# Patient Record
Sex: Female | Born: 1950 | Race: White | Hispanic: No | Smoking: Former smoker
Health system: Southern US, Community
[De-identification: ages and names within clinical notes are randomized; demographics above are authoritative.]

## PROBLEM LIST (undated history)

## (undated) DIAGNOSIS — E039 Hypothyroidism, unspecified: Secondary | ICD-10-CM

## (undated) DIAGNOSIS — G43909 Migraine, unspecified, not intractable, without status migrainosus: Secondary | ICD-10-CM

## (undated) DIAGNOSIS — F1010 Alcohol abuse, uncomplicated: Secondary | ICD-10-CM

## (undated) DIAGNOSIS — G4700 Insomnia, unspecified: Secondary | ICD-10-CM

## (undated) DIAGNOSIS — R05 Cough: Secondary | ICD-10-CM

## (undated) DIAGNOSIS — K56609 Unspecified intestinal obstruction, unspecified as to partial versus complete obstruction: Secondary | ICD-10-CM

## (undated) DIAGNOSIS — I10 Essential (primary) hypertension: Secondary | ICD-10-CM

## (undated) DIAGNOSIS — R52 Pain, unspecified: Secondary | ICD-10-CM

## (undated) DIAGNOSIS — F410 Panic disorder [episodic paroxysmal anxiety] without agoraphobia: Secondary | ICD-10-CM

## (undated) DIAGNOSIS — K219 Gastro-esophageal reflux disease without esophagitis: Secondary | ICD-10-CM

## (undated) DIAGNOSIS — G8929 Other chronic pain: Secondary | ICD-10-CM

## (undated) DIAGNOSIS — R059 Cough, unspecified: Secondary | ICD-10-CM

## (undated) DIAGNOSIS — F32A Depression, unspecified: Secondary | ICD-10-CM

## (undated) DIAGNOSIS — J449 Chronic obstructive pulmonary disease, unspecified: Secondary | ICD-10-CM

## (undated) DIAGNOSIS — F329 Major depressive disorder, single episode, unspecified: Secondary | ICD-10-CM

## (undated) DIAGNOSIS — M8580 Other specified disorders of bone density and structure, unspecified site: Secondary | ICD-10-CM

## (undated) HISTORY — PX: COLON SURGERY: SHX602

## (undated) HISTORY — PX: TUBAL LIGATION: SHX77

## (undated) HISTORY — DX: Insomnia, unspecified: G47.00

## (undated) HISTORY — PX: LAPAROTOMY: SHX154

## (undated) HISTORY — PX: ABDOMINAL HYSTERECTOMY: SHX81

## (undated) HISTORY — DX: Migraine, unspecified, not intractable, without status migrainosus: G43.909

## (undated) HISTORY — DX: Other specified disorders of bone density and structure, unspecified site: M85.80

## (undated) HISTORY — PX: INCONTINENCE SURGERY: SHX676

---

## 2010-09-15 LAB — HM PAP SMEAR

## 2011-10-04 ENCOUNTER — Other Ambulatory Visit: Payer: Self-pay

## 2011-10-04 ENCOUNTER — Inpatient Hospital Stay (HOSPITAL_COMMUNITY)
Admission: EM | Admit: 2011-10-04 | Discharge: 2011-10-24 | DRG: 853 | Disposition: A | Discharge status: Home / Self Care | Attending: Surgery | Admitting: Surgery

## 2011-10-04 ENCOUNTER — Emergency Department (HOSPITAL_COMMUNITY): Admitting: Anesthesiology

## 2011-10-04 ENCOUNTER — Encounter (HOSPITAL_COMMUNITY): Admission: EM | Disposition: A | Discharge status: Home / Self Care | Payer: Self-pay | Source: Home / Self Care

## 2011-10-04 ENCOUNTER — Emergency Department (HOSPITAL_COMMUNITY)

## 2011-10-04 ENCOUNTER — Encounter (HOSPITAL_COMMUNITY): Payer: Self-pay | Admitting: Anesthesiology

## 2011-10-04 ENCOUNTER — Encounter (HOSPITAL_COMMUNITY): Payer: Self-pay | Admitting: *Deleted

## 2011-10-04 DIAGNOSIS — K5900 Constipation, unspecified: Secondary | ICD-10-CM | POA: Diagnosis present

## 2011-10-04 DIAGNOSIS — Z720 Tobacco use: Secondary | ICD-10-CM | POA: Diagnosis present

## 2011-10-04 DIAGNOSIS — K658 Other peritonitis: Secondary | ICD-10-CM | POA: Diagnosis present

## 2011-10-04 DIAGNOSIS — A419 Sepsis, unspecified organism: Principal | ICD-10-CM | POA: Diagnosis present

## 2011-10-04 DIAGNOSIS — J449 Chronic obstructive pulmonary disease, unspecified: Secondary | ICD-10-CM | POA: Diagnosis present

## 2011-10-04 DIAGNOSIS — R188 Other ascites: Secondary | ICD-10-CM | POA: Diagnosis not present

## 2011-10-04 DIAGNOSIS — D649 Anemia, unspecified: Secondary | ICD-10-CM | POA: Diagnosis present

## 2011-10-04 DIAGNOSIS — I10 Essential (primary) hypertension: Secondary | ICD-10-CM | POA: Diagnosis present

## 2011-10-04 DIAGNOSIS — R0902 Hypoxemia: Secondary | ICD-10-CM | POA: Diagnosis present

## 2011-10-04 DIAGNOSIS — E8779 Other fluid overload: Secondary | ICD-10-CM | POA: Diagnosis not present

## 2011-10-04 DIAGNOSIS — J189 Pneumonia, unspecified organism: Secondary | ICD-10-CM | POA: Diagnosis not present

## 2011-10-04 DIAGNOSIS — E861 Hypovolemia: Secondary | ICD-10-CM | POA: Diagnosis not present

## 2011-10-04 DIAGNOSIS — J4489 Other specified chronic obstructive pulmonary disease: Secondary | ICD-10-CM | POA: Diagnosis present

## 2011-10-04 DIAGNOSIS — K67 Disorders of peritoneum in infectious diseases classified elsewhere: Secondary | ICD-10-CM

## 2011-10-04 DIAGNOSIS — Z885 Allergy status to narcotic agent status: Secondary | ICD-10-CM

## 2011-10-04 DIAGNOSIS — K631 Perforation of intestine (nontraumatic): Secondary | ICD-10-CM | POA: Diagnosis present

## 2011-10-04 DIAGNOSIS — R5381 Other malaise: Secondary | ICD-10-CM | POA: Diagnosis not present

## 2011-10-04 DIAGNOSIS — K659 Peritonitis, unspecified: Secondary | ICD-10-CM

## 2011-10-04 DIAGNOSIS — R000 Tachycardia, unspecified: Secondary | ICD-10-CM | POA: Diagnosis not present

## 2011-10-04 DIAGNOSIS — G8929 Other chronic pain: Secondary | ICD-10-CM | POA: Diagnosis present

## 2011-10-04 DIAGNOSIS — Z87891 Personal history of nicotine dependence: Secondary | ICD-10-CM

## 2011-10-04 DIAGNOSIS — G43909 Migraine, unspecified, not intractable, without status migrainosus: Secondary | ICD-10-CM | POA: Diagnosis present

## 2011-10-04 DIAGNOSIS — E876 Hypokalemia: Secondary | ICD-10-CM | POA: Diagnosis present

## 2011-10-04 DIAGNOSIS — R6510 Systemic inflammatory response syndrome (SIRS) of non-infectious origin without acute organ dysfunction: Secondary | ICD-10-CM

## 2011-10-04 DIAGNOSIS — I959 Hypotension, unspecified: Secondary | ICD-10-CM | POA: Diagnosis not present

## 2011-10-04 DIAGNOSIS — R5082 Postprocedural fever: Secondary | ICD-10-CM | POA: Diagnosis not present

## 2011-10-04 DIAGNOSIS — M549 Dorsalgia, unspecified: Secondary | ICD-10-CM | POA: Diagnosis present

## 2011-10-04 DIAGNOSIS — K560 Paralytic ileus: Secondary | ICD-10-CM | POA: Diagnosis not present

## 2011-10-04 DIAGNOSIS — E46 Unspecified protein-calorie malnutrition: Secondary | ICD-10-CM | POA: Diagnosis not present

## 2011-10-04 HISTORY — DX: Essential (primary) hypertension: I10

## 2011-10-04 LAB — DIFFERENTIAL
Basophils Absolute: 0 10*3/uL (ref 0.0–0.1)
Basophils Relative: 0 % (ref 0–1)
Eosinophils Absolute: 0 10*3/uL (ref 0.0–0.7)
Eosinophils Relative: 0 % (ref 0–5)
Lymphocytes Relative: 18 % (ref 12–46)
Lymphs Abs: 0.8 10*3/uL (ref 0.7–4.0)
Monocytes Absolute: 0.2 10*3/uL (ref 0.1–1.0)
Monocytes Relative: 4 % (ref 3–12)
Neutro Abs: 3.6 10*3/uL (ref 1.7–7.7)
Neutrophils Relative %: 78 % — ABNORMAL HIGH (ref 43–77)

## 2011-10-04 LAB — CBC
HCT: 47.8 % — ABNORMAL HIGH (ref 36.0–46.0)
HCT: 44.9 % (ref 36.0–46.0)
HCT: 40.4 % (ref 36.0–46.0)
Hemoglobin: 16 g/dL — ABNORMAL HIGH (ref 12.0–15.0)
Hemoglobin: 14.6 g/dL (ref 12.0–15.0)
Hemoglobin: 13.3 g/dL (ref 12.0–15.0)
MCH: 30.5 pg (ref 26.0–34.0)
MCH: 30.4 pg (ref 26.0–34.0)
MCH: 30.6 pg (ref 26.0–34.0)
MCHC: 33.5 g/dL (ref 30.0–36.0)
MCHC: 32.5 g/dL (ref 30.0–36.0)
MCHC: 32.9 g/dL (ref 30.0–36.0)
MCV: 91 fL (ref 78.0–100.0)
MCV: 93.3 fL (ref 78.0–100.0)
MCV: 92.9 fL (ref 78.0–100.0)
Platelets: 343 10*3/uL (ref 150–400)
Platelets: 296 10*3/uL (ref 150–400)
RBC: 5.25 MIL/uL — ABNORMAL HIGH (ref 3.87–5.11)
RBC: 4.81 MIL/uL (ref 3.87–5.11)
RBC: 4.35 MIL/uL (ref 3.87–5.11)
RDW: 12.3 % (ref 11.5–15.5)
RDW: 12.4 % (ref 11.5–15.5)
RDW: 12.5 % (ref 11.5–15.5)
WBC: 4.6 10*3/uL (ref 4.0–10.5)
WBC: 1.5 10*3/uL — ABNORMAL LOW (ref 4.0–10.5)
WBC: 1.7 10*3/uL — ABNORMAL LOW (ref 4.0–10.5)

## 2011-10-04 LAB — MRSA PCR SCREENING: MRSA by PCR: NEGATIVE

## 2011-10-04 LAB — COMPREHENSIVE METABOLIC PANEL
ALT: 24 U/L (ref 0–35)
AST: 34 U/L (ref 0–37)
Albumin: 4.3 g/dL (ref 3.5–5.2)
Alkaline Phosphatase: 75 U/L (ref 39–117)
BUN: 16 mg/dL (ref 6–23)
CO2: 29 mEq/L (ref 19–32)
Calcium: 9.3 mg/dL (ref 8.4–10.5)
Chloride: 94 mEq/L — ABNORMAL LOW (ref 96–112)
Creatinine, Ser: 0.77 mg/dL (ref 0.50–1.10)
GFR calc Af Amer: >90 mL/min (ref >90)
GFR calc non Af Amer: 88 mL/min — ABNORMAL LOW (ref >90)
Glucose, Bld: 116 mg/dL — ABNORMAL HIGH (ref 70–99)
Potassium: 2.8 mEq/L — ABNORMAL LOW (ref 3.5–5.1)
Sodium: 136 mEq/L (ref 135–145)
Total Bilirubin: 0.4 mg/dL (ref 0.3–1.2)
Total Protein: 7.1 g/dL (ref 6.0–8.3)

## 2011-10-04 LAB — BASIC METABOLIC PANEL
BUN: 11 mg/dL (ref 6–23)
CO2: 22 mEq/L (ref 19–32)
Calcium: 7.6 mg/dL — ABNORMAL LOW (ref 8.4–10.5)
Chloride: 103 mEq/L (ref 96–112)
Creatinine, Ser: 0.65 mg/dL (ref 0.50–1.10)
GFR calc Af Amer: >90 mL/min (ref >90)
GFR calc non Af Amer: >90 mL/min (ref >90)
Glucose, Bld: 122 mg/dL — ABNORMAL HIGH (ref 70–99)
Potassium: 3.5 mEq/L (ref 3.5–5.1)
Sodium: 138 mEq/L (ref 135–145)

## 2011-10-04 LAB — URINALYSIS, ROUTINE W REFLEX MICROSCOPIC
Bilirubin Urine: NEGATIVE
Glucose, UA: NEGATIVE mg/dL
Hgb urine dipstick: NEGATIVE
Ketones, ur: NEGATIVE mg/dL
Leukocytes, UA: NEGATIVE
Nitrite: NEGATIVE
Protein, ur: NEGATIVE mg/dL
Specific Gravity, Urine: 1.019 (ref 1.005–1.030)
Urobilinogen, UA: 0.2 mg/dL (ref 0.0–1.0)
pH: 7.5 (ref 5.0–8.0)

## 2011-10-04 LAB — OCCULT BLOOD, POC DEVICE: Fecal Occult Bld: NEGATIVE

## 2011-10-04 LAB — LIPASE, BLOOD: Lipase: 20 U/L (ref 11–59)

## 2011-10-04 SURGERY — LAPAROTOMY, EXPLORATORY
Anesthesia: General | Site: Abdomen | Wound class: Dirty or Infected

## 2011-10-04 MED ORDER — LACTATED RINGERS IV SOLN
INTRAVENOUS | Status: DC | PRN
  Administered 2011-10-04 (×2): via INTRAVENOUS

## 2011-10-04 MED ORDER — VITAMINS A & D EX OINT
TOPICAL_OINTMENT | CUTANEOUS | Status: AC
  Administered 2011-10-05: 04:00:00
  Filled 2011-10-04: qty 5

## 2011-10-04 MED ORDER — ONDANSETRON HCL 4 MG/2ML IJ SOLN
INTRAMUSCULAR | Status: AC
  Filled 2011-10-04: qty 2

## 2011-10-04 MED ORDER — GLYCOPYRROLATE 0.2 MG/ML IJ SOLN
INTRAMUSCULAR | Status: DC | PRN
  Administered 2011-10-04: 0.6 mg via INTRAVENOUS

## 2011-10-04 MED ORDER — FENTANYL CITRATE 0.05 MG/ML IJ SOLN
50.0000 ug | INTRAMUSCULAR | Status: DC | PRN
  Administered 2011-10-04 – 2011-10-05 (×6): 50 ug via INTRAVENOUS
  Administered 2011-10-05: 100 ug via INTRAVENOUS
  Administered 2011-10-05 (×10): 50 ug via INTRAVENOUS
  Administered 2011-10-06 (×4): 100 ug via INTRAVENOUS
  Filled 2011-10-04 (×16): qty 2

## 2011-10-04 MED ORDER — DEXAMETHASONE SODIUM PHOSPHATE 10 MG/ML IJ SOLN
INTRAMUSCULAR | Status: DC | PRN
  Administered 2011-10-04: 10 mg via INTRAVENOUS

## 2011-10-04 MED ORDER — NEOSTIGMINE METHYLSULFATE 1 MG/ML IJ SOLN
INTRAMUSCULAR | Status: DC | PRN
  Administered 2011-10-04: 4 mg via INTRAVENOUS

## 2011-10-04 MED ORDER — SODIUM CHLORIDE 0.9 % IV BOLUS (SEPSIS)
1000.0000 mL | Freq: Once | INTRAVENOUS | Status: AC
  Administered 2011-10-04: 1000 mL via INTRAVENOUS

## 2011-10-04 MED ORDER — HYDROMORPHONE HCL PF 1 MG/ML IJ SOLN
INTRAMUSCULAR | Status: AC
  Filled 2011-10-04: qty 1

## 2011-10-04 MED ORDER — POVIDONE-IODINE 10 % EX OINT
TOPICAL_OINTMENT | CUTANEOUS | Status: AC
  Filled 2011-10-04: qty 28.35

## 2011-10-04 MED ORDER — SODIUM CHLORIDE 0.9 % IV SOLN
INTRAVENOUS | Status: DC | PRN
  Administered 2011-10-04: 16:00:00 via INTRAVENOUS

## 2011-10-04 MED ORDER — PIPERACILLIN-TAZOBACTAM 3.375 G IVPB
3.3750 g | Freq: Once | INTRAVENOUS | Status: AC
  Administered 2011-10-04: 3.375 g via INTRAVENOUS
  Filled 2011-10-04: qty 50

## 2011-10-04 MED ORDER — MORPHINE SULFATE 4 MG/ML IJ SOLN: 4.0000 mg | INTRAMUSCULAR | Status: DC | PRN

## 2011-10-04 MED ORDER — ONDANSETRON HCL 4 MG/2ML IJ SOLN
4.0000 mg | Freq: Once | INTRAMUSCULAR | Status: AC
  Administered 2011-10-04: 4 mg via INTRAVENOUS
  Filled 2011-10-04: qty 2

## 2011-10-04 MED ORDER — DROPERIDOL 2.5 MG/ML IJ SOLN
INTRAMUSCULAR | Status: DC | PRN
  Administered 2011-10-04: 1.25 mg via INTRAVENOUS

## 2011-10-04 MED ORDER — PANTOPRAZOLE SODIUM 40 MG IV SOLR
40.0000 mg | INTRAVENOUS | Status: DC
  Administered 2011-10-04 – 2011-10-11 (×8): 40 mg via INTRAVENOUS
  Filled 2011-10-04 (×10): qty 40

## 2011-10-04 MED ORDER — HYDROMORPHONE HCL PF 1 MG/ML IJ SOLN
0.2500 mg | INTRAMUSCULAR | Status: DC | PRN
  Administered 2011-10-04 (×4): 0.5 mg via INTRAVENOUS
  Filled 2011-10-04: qty 1

## 2011-10-04 MED ORDER — SODIUM CHLORIDE 0.9 % IV BOLUS (SEPSIS)
500.0000 mL | Freq: Once | INTRAVENOUS | Status: AC
  Administered 2011-10-04: 500 mL via INTRAVENOUS

## 2011-10-04 MED ORDER — CISATRACURIUM BESYLATE 2 MG/ML IV SOLN
INTRAVENOUS | Status: DC | PRN
  Administered 2011-10-04: 5 mg via INTRAVENOUS
  Administered 2011-10-04 (×2): 1 mg via INTRAVENOUS

## 2011-10-04 MED ORDER — HYDROMORPHONE HCL PF 1 MG/ML IJ SOLN
1.0000 mg | Freq: Once | INTRAMUSCULAR | Status: AC
  Administered 2011-10-04: 1 mg via INTRAVENOUS
  Filled 2011-10-04: qty 1

## 2011-10-04 MED ORDER — LACTATED RINGERS IV SOLN: INTRAVENOUS | Status: DC

## 2011-10-04 MED ORDER — SUCCINYLCHOLINE CHLORIDE 20 MG/ML IJ SOLN
INTRAMUSCULAR | Status: DC | PRN
  Administered 2011-10-04: 100 mg via INTRAVENOUS

## 2011-10-04 MED ORDER — SODIUM CHLORIDE 0.9 % IV SOLN
1.0000 g | INTRAVENOUS | Status: DC
  Administered 2011-10-04 – 2011-10-14 (×11): 1 g via INTRAVENOUS
  Filled 2011-10-04 (×12): qty 1

## 2011-10-04 MED ORDER — FIBRIN SEALANT COMPONENT 2 ML EX KIT
PACK | CUTANEOUS | Status: AC
  Filled 2011-10-04: qty 2

## 2011-10-04 MED ORDER — HYDROMORPHONE HCL PF 1 MG/ML IJ SOLN
1.0000 mg | Freq: Once | INTRAMUSCULAR | Status: AC
  Administered 2011-10-04: 1 mg via INTRAVENOUS

## 2011-10-04 MED ORDER — FENTANYL CITRATE 0.05 MG/ML IJ SOLN
INTRAMUSCULAR | Status: DC | PRN
  Administered 2011-10-04: 25 ug via INTRAVENOUS
  Administered 2011-10-04: 100 ug via INTRAVENOUS

## 2011-10-04 MED ORDER — MORPHINE SULFATE 4 MG/ML IJ SOLN
4.0000 mg | Freq: Once | INTRAMUSCULAR | Status: AC
  Administered 2011-10-04: 4 mg via INTRAVENOUS
  Filled 2011-10-04: qty 1

## 2011-10-04 MED ORDER — ONDANSETRON HCL 4 MG PO TABS: 4.0000 mg | ORAL_TABLET | Freq: Four times a day (QID) | ORAL | Status: DC | PRN

## 2011-10-04 MED ORDER — SODIUM CHLORIDE 0.9 % IR SOLN
Status: DC | PRN
  Administered 2011-10-04: 10000 mL

## 2011-10-04 MED ORDER — PIPERACILLIN-TAZOBACTAM 3.375 G IVPB
3.3750 g | Freq: Three times a day (TID) | INTRAVENOUS | Status: DC
  Filled 2011-10-04 (×2): qty 50

## 2011-10-04 MED ORDER — POTASSIUM CHLORIDE IN NACL 40-0.9 MEQ/L-% IV SOLN
INTRAVENOUS | Status: DC
  Filled 2011-10-04 (×4): qty 1000

## 2011-10-04 MED ORDER — ACETAMINOPHEN 650 MG RE SUPP
650.0000 mg | Freq: Once | RECTAL | Status: AC
  Administered 2011-10-04: 650 mg via RECTAL

## 2011-10-04 MED ORDER — ACETAMINOPHEN 650 MG RE SUPP
RECTAL | Status: AC
  Filled 2011-10-04: qty 1

## 2011-10-04 MED ORDER — PROMETHAZINE HCL 25 MG/ML IJ SOLN
25.0000 mg | Freq: Once | INTRAMUSCULAR | Status: AC
  Administered 2011-10-04: 25 mg via INTRAVENOUS
  Filled 2011-10-04: qty 1

## 2011-10-04 MED ORDER — LACTATED RINGERS IV SOLN
INTRAVENOUS | Status: DC | PRN
  Administered 2011-10-04: 17:00:00 via INTRAVENOUS

## 2011-10-04 MED ORDER — ONDANSETRON HCL 4 MG/2ML IJ SOLN
4.0000 mg | Freq: Four times a day (QID) | INTRAMUSCULAR | Status: DC | PRN
  Filled 2011-10-04: qty 2

## 2011-10-04 MED ORDER — ONDANSETRON HCL 4 MG/2ML IJ SOLN
INTRAMUSCULAR | Status: DC | PRN
  Administered 2011-10-04: 4 mg via INTRAVENOUS

## 2011-10-04 MED ORDER — HEPARIN SODIUM (PORCINE) 5000 UNIT/ML IJ SOLN
5000.0000 [IU] | Freq: Three times a day (TID) | INTRAMUSCULAR | Status: DC
  Administered 2011-10-04 – 2011-10-24 (×60): 5000 [IU] via SUBCUTANEOUS
  Filled 2011-10-04 (×63): qty 1

## 2011-10-04 MED ORDER — POTASSIUM CHLORIDE IN NACL 40-0.9 MEQ/L-% IV SOLN
INTRAVENOUS | Status: DC
  Administered 2011-10-04 – 2011-10-06 (×4): via INTRAVENOUS
  Administered 2011-10-06 (×2): 15 mL/h via INTRAVENOUS
  Filled 2011-10-04 (×12): qty 1000

## 2011-10-04 MED ORDER — VANCOMYCIN HCL 1000 MG IV SOLR
750.0000 mg | Freq: Three times a day (TID) | INTRAVENOUS | Status: DC
  Administered 2011-10-04 – 2011-10-05 (×3): 750 mg via INTRAVENOUS
  Filled 2011-10-04 (×5): qty 750

## 2011-10-04 MED ORDER — POTASSIUM CHLORIDE 10 MEQ/100ML IV SOLN
10.0000 meq | Freq: Once | INTRAVENOUS | Status: AC
  Administered 2011-10-04: 10 meq via INTRAVENOUS
  Filled 2011-10-04: qty 100

## 2011-10-04 MED ORDER — PROPOFOL 10 MG/ML IV EMUL
INTRAVENOUS | Status: DC | PRN
  Administered 2011-10-04: 150 mg via INTRAVENOUS

## 2011-10-04 MED ORDER — HYDROMORPHONE HCL PF 1 MG/ML IJ SOLN: 0.5000 mg | INTRAMUSCULAR | Status: DC | PRN

## 2011-10-04 SURGICAL SUPPLY — 42 items
APPLICATOR COTTON TIP 6IN STRL (MISCELLANEOUS) ×2 IMPLANT
BLADE EXTENDED COATED 6.5IN (ELECTRODE) IMPLANT
BLADE HEX COATED 2.75 (ELECTRODE) ×2 IMPLANT
CANISTER SUCTION 2500CC (MISCELLANEOUS) ×2 IMPLANT
CLOTH BEACON ORANGE TIMEOUT ST (SAFETY) ×2 IMPLANT
COVER MAYO STAND STRL (DRAPES) IMPLANT
DRAPE LAPAROSCOPIC ABDOMINAL (DRAPES) ×2 IMPLANT
DRAPE WARM FLUID 44X44 (DRAPE) IMPLANT
DRSG PAD ABDOMINAL 8X10 ST (GAUZE/BANDAGES/DRESSINGS) ×1 IMPLANT
ELECT REM PT RETURN 9FT ADLT (ELECTROSURGICAL) ×2
ELECTRODE REM PT RTRN 9FT ADLT (ELECTROSURGICAL) ×1 IMPLANT
GLOVE BIOGEL PI IND STRL 7.0 (GLOVE) ×1 IMPLANT
GLOVE BIOGEL PI INDICATOR 7.0 (GLOVE) ×1
GLOVE EUDERMIC 7 POWDERFREE (GLOVE) ×2 IMPLANT
GOWN STRL NON-REIN LRG LVL3 (GOWN DISPOSABLE) ×2 IMPLANT
GOWN STRL REIN XL XLG (GOWN DISPOSABLE) ×4 IMPLANT
KIT BASIN OR (CUSTOM PROCEDURE TRAY) ×2 IMPLANT
LIGASURE IMPACT 36 18CM CVD LR (INSTRUMENTS) ×1 IMPLANT
NS IRRIG 1000ML POUR BTL (IV SOLUTION) ×2 IMPLANT
PACK GENERAL/GYN (CUSTOM PROCEDURE TRAY) ×2 IMPLANT
RELOAD PROXIMATE 75MM BLUE (ENDOMECHANICALS) ×2 IMPLANT
RELOAD STAPLE 75 3.8 BLU REG (ENDOMECHANICALS) IMPLANT
SPONGE GAUZE 4X4 12PLY (GAUZE/BANDAGES/DRESSINGS) ×2 IMPLANT
SPONGE LAP 18X18 X RAY DECT (DISPOSABLE) IMPLANT
STAPLER PROXIMATE 75MM BLUE (STAPLE) ×1 IMPLANT
STAPLER VISISTAT 35W (STAPLE) ×2 IMPLANT
SUCTION POOLE TIP (SUCTIONS) IMPLANT
SUT PDS AB 1 CTX 36 (SUTURE) IMPLANT
SUT PDS AB 1 TP1 96 (SUTURE) ×4 IMPLANT
SUT SILK 2 0 (SUTURE)
SUT SILK 2 0 SH CR/8 (SUTURE) IMPLANT
SUT SILK 2-0 18XBRD TIE 12 (SUTURE) IMPLANT
SUT SILK 3 0 (SUTURE)
SUT SILK 3 0 SH CR/8 (SUTURE) IMPLANT
SUT SILK 3-0 18XBRD TIE 12 (SUTURE) IMPLANT
SUT VIC AB 3-0 SH 18 (SUTURE) ×1 IMPLANT
SUT VICRYL 2 0 18  UND BR (SUTURE)
SUT VICRYL 2 0 18 UND BR (SUTURE) IMPLANT
TAPE CLOTH SURG 4X10 WHT LF (GAUZE/BANDAGES/DRESSINGS) ×1 IMPLANT
TOWEL OR 17X26 10 PK STRL BLUE (TOWEL DISPOSABLE) ×4 IMPLANT
TRAY FOLEY CATH 14FRSI W/METER (CATHETERS) IMPLANT
YANKAUER SUCT BULB TIP NO VENT (SUCTIONS) IMPLANT

## 2011-10-04 NOTE — Anesthesia Preprocedure Evaluation (Addendum)
Anesthesia Evaluation  Patient identified by MRN, date of birth, ID band Patient awake    Reviewed: Allergy & Precautions, H&P , NPO status , Patient's Chart, lab work & pertinent test results  Airway Mallampati: II TM Distance: >3 FB Neck ROM: full    Dental  (+) Missing and Dental Advisory Given,    Pulmonary neg pulmonary ROS,  breath sounds clear to auscultation  Pulmonary exam normal       Cardiovascular Exercise Tolerance: Good hypertension, negative cardio ROS  Rhythm:regular Rate:Normal  Early sepsis   Neuro/Psych negative neurological ROS  negative psych ROS   GI/Hepatic negative GI ROS, Neg liver ROS, Perforated bowel with free air   Endo/Other  negative endocrine ROS  Renal/GU negative Renal ROS  negative genitourinary   Musculoskeletal   Abdominal   Peds  Hematology negative hematology ROS (+)   Anesthesia Other Findings   Reproductive/Obstetrics negative OB ROS                          Anesthesia Physical Anesthesia Plan  ASA: III and Emergent  Anesthesia Plan: General   Post-op Pain Management:    Induction: Intravenous, Rapid sequence and Cricoid pressure planned  Airway Management Planned: Oral ETT  Additional Equipment:   Intra-op Plan:   Post-operative Plan: Extubation in OR  Informed Consent: I have reviewed the patients History and Physical, chart, labs and discussed the procedure including the risks, benefits and alternatives for the proposed anesthesia with the patient or authorized representative who has indicated his/her understanding and acceptance.   Dental Advisory Given  Plan Discussed with: CRNA and Surgeon  Anesthesia Plan Comments:        Anesthesia Quick Evaluation

## 2011-10-04 NOTE — H&P (Signed)
Stacy Dalton is an 61 y.o. female.   Chief Complaint: Acute onset of abdominal pain HPI: This is a 61 year old female who was in her normal state of health until this point around 11 AM. She or her husband both report acute onset of abdominal pain. He was extremely severe she was brought to the emergency room where initial evaluation there was concern her respiratory status because of her hypoxia. Her complaint was abdominal pain. Chest x-ray on admission showed of large amount of free air in the abdomen. CT of the abdomen shows a large amount of free intraperitoneal air consistent with perforated bowel. It was done without contrast there was no abnormality the stomach is some fecal material which may not be in the colon. We were called to the ER and saw the patient emergently. On arrival bladder temperature was 103.3. She was extremely tachycardic heart rates in the 110-120 range. O2 saturations were dropping down into the 80s on a nasal cannula she was placed on a facemask. She's been treated with analgesics and Phenergan for pain and nausea. Zosyn was been started by the emergency room physician. She was hypokalemic potassium was started in the ER. She was evaluated by Dr. Claud Kelp and it was his opinion she needed to go to the operating room emergently for exploratory laparotomy and repair of perforated viscus. The risk and benefits were discussed with the patient and her husband and brother.  Past Medical History  Diagnosis Date  . Migraine   . Hypertension CHRONIC BACK PAIN she uses hydrocodone almost daily HISTORY OF TOBACCO USE/NONE FOR 13 YEARS     Past Surgical History  Procedure Date  . Tubal ligation   . Bladder tac     History reviewed. No pertinent family history. Social History:  reports that she quit smoking about 14 years ago. She has never used smokeless tobacco. She reports that she does not drink alcohol or use illicit drugs.tobacco:  UP TO 2ppd 35 PLUS YEAR  ETOH:  none  Drugs: none  Allergies:  Allergies  Allergen Reactions  . Demerol   . Esgic (Butalbital-Apap-Caffeine)   . Iodine Other (See Comments)    bp bottomed out per pt several hours later; unsure if pre medicated in past with cm; done in [place]    Medications Prior to Admission  Medication Dose Route Frequency Provider Last Rate Last Dose  . 0.9 % NaCl with KCl 40 mEq / L  infusion   Intravenous Continuous Sherrie George, PA      . acetaminophen (TYLENOL) suppository 650 mg  650 mg Rectal Once Ernestene Mention, MD   650 mg at 10/04/11 1531  . HYDROmorphone (DILAUDID) injection 0.5-1.5 mg  0.5-1.5 mg Intravenous Q1H PRN Sherrie George, PA      . HYDROmorphone (DILAUDID) injection 1 mg  1 mg Intravenous Once Hilario Quarry, MD   1 mg at 10/04/11 1442  . HYDROmorphone (DILAUDID) injection 1 mg  1 mg Intravenous Once Ernestene Mention, MD   1 mg at 10/04/11 1529  . morphine 4 MG/ML injection 4 mg  4 mg Intravenous Once Fayrene Helper, PA-C   4 mg at 10/04/11 1346  . ondansetron (ZOFRAN) injection 4 mg  4 mg Intravenous Once Fayrene Helper, PA-C   4 mg at 10/04/11 1346  . ondansetron (ZOFRAN) injection 4 mg  4 mg Intravenous Once Hilario Quarry, MD   4 mg at 10/04/11 1443  . piperacillin-tazobactam (ZOSYN) IVPB 3.375 g  3.375  g Intravenous Once Hilario Quarry, MD   3.375 g at 10/04/11 1558  . piperacillin-tazobactam (ZOSYN) IVPB 3.375 g  3.375 g Intravenous Q8H Sherrie George, [place]      . potassium chloride 10 mEq in 100 mL IVPB  10 mEq Intravenous Once Hilario Quarry, MD      . promethazine (PHENERGAN) injection 25 mg  25 mg Intravenous Once Ernestene Mention, MD   25 mg at 10/04/11 1528  . sodium chloride 0.9 % bolus 1,000 mL  1,000 mL Intravenous Once Fayrene Helper, PA-C   1,000 mL at 10/04/11 1347  . sodium chloride 0.9 % bolus 1,000 mL  1,000 mL Intravenous Once Hilario Quarry, MD   1,000 mL at 10/04/11 1443  . sodium chloride 0.9 % bolus 1,000 mL  1,000 mL Intravenous Once Hilario Quarry, MD   1,000  mL at 10/04/11 1558   No current outpatient prescriptions on file as of 10/04/2011.    Results for orders placed during the hospital encounter of 10/04/11 (from the past 48 hour(s))  CBC     Status: Abnormal   Collection Time   10/04/11  1:45 PM      Component Value Range Comment   WBC 4.6  4.0 - 10.5 (K/uL)    RBC 5.25 (*) 3.87 - 5.11 (MIL/uL)    Hemoglobin 16.0 (*) 12.0 - 15.0 (g/dL)    HCT 16.1 (*) 09.6 - 46.0 (%)    MCV 91.0  78.0 - 100.0 (fL)    MCH 30.5  26.0 - 34.0 (pg)    MCHC 33.5  30.0 - 36.0 (g/dL)    RDW 04.5  40.9 - 81.1 (%)    Platelets 343  150 - 400 (K/uL)   DIFFERENTIAL     Status: Abnormal   Collection Time   10/04/11  1:45 PM      Component Value Range Comment   Neutrophils Relative 78 (*) 43 - 77 (%)    Neutro Abs 3.6  1.7 - 7.7 (K/uL)    Lymphocytes Relative 18  12 - 46 (%)    Lymphs Abs 0.8  0.7 - 4.0 (K/uL)    Monocytes Relative 4  3 - 12 (%)    Monocytes Absolute 0.2  0.1 - 1.0 (K/uL)    Eosinophils Relative 0  0 - 5 (%)    Eosinophils Absolute 0.0  0.0 - 0.7 (K/uL)    Basophils Relative 0  0 - 1 (%)    Basophils Absolute 0.0  0.0 - 0.1 (K/uL)   COMPREHENSIVE METABOLIC PANEL     Status: Abnormal   Collection Time   10/04/11  1:45 PM      Component Value Range Comment   Sodium 136  135 - 145 (mEq/L)    Potassium 2.8 (*) 3.5 - 5.1 (mEq/L)    Chloride 94 (*) 96 - 112 (mEq/L)    CO2 29  19 - 32 (mEq/L)    Glucose, Bld 116 (*) 70 - 99 (mg/dL)    BUN 16  6 - 23 (mg/dL)    Creatinine, Ser 9.14  0.50 - 1.10 (mg/dL)    Calcium 9.3  8.4 - 10.5 (mg/dL)    Total Protein 7.1  6.0 - 8.3 (g/dL)    Albumin 4.3  3.5 - 5.2 (g/dL)    AST 34  0 - 37 (U/L)    ALT 24  0 - 35 (U/L)    Alkaline Phosphatase 75  39 - 117 (U/L)  Total Bilirubin 0.4  0.3 - 1.2 (mg/dL)    GFR calc non Af Amer 88 (*) >90 (mL/min)    GFR calc Af Amer >90  >90 (mL/min)   LIPASE, BLOOD     Status: Normal   Collection Time   10/04/11  1:45 PM      Component Value Range Comment   Lipase 20  11 -  59 (U/L)   OCCULT BLOOD, POC DEVICE     Status: Normal   Collection Time   10/04/11  2:34 PM      Component Value Range Comment   Fecal Occult Bld NEGATIVE     URINALYSIS, ROUTINE W REFLEX MICROSCOPIC     Status: Normal   Collection Time   10/04/11  2:48 PM      Component Value Range Comment   Color, Urine YELLOW  YELLOW     APPearance CLEAR  CLEAR     Specific Gravity, Urine 1.019  1.005 - 1.030     pH 7.5  5.0 - 8.0     Glucose, UA NEGATIVE  NEGATIVE (mg/dL)    Hgb urine dipstick NEGATIVE  NEGATIVE     Bilirubin Urine NEGATIVE  NEGATIVE     Ketones, ur NEGATIVE  NEGATIVE (mg/dL)    Protein, ur NEGATIVE  NEGATIVE (mg/dL)    Urobilinogen, UA 0.2  0.0 - 1.0 (mg/dL)    Nitrite NEGATIVE  NEGATIVE     Leukocytes, UA NEGATIVE  NEGATIVE  MICROSCOPIC NOT DONE ON URINES WITH NEGATIVE PROTEIN, BLOOD, LEUKOCYTES, NITRITE, OR GLUCOSE <1000 mg/dL.   Ct Abdomen Pelvis Wo Contrast  10/04/2011  *RADIOLOGY REPORT*  Clinical Data: Abdominal pain with nausea and vomiting for 2 days, some abdominal distention  CT ABDOMEN AND PELVIS WITHOUT CONTRAST  Technique:  Multidetector CT imaging of the abdomen and pelvis was performed following the standard protocol without intravenous contrast.  Comparison: None.  Findings: Mild basilar linear atelectasis is present.  The liver is unremarkable in the unenhanced state.  There is some perihepatic ascites present.  The gallbladder is somewhat distended but no calcified gallstones are seen and the gallbladder wall does not appear to be thickened. The common bile duct is slightly prominent measuring 8 mm in diameter.  Correlation with liver function tests is recommended.  The pancreas is unremarkable and the pancreatic duct does not appear to be dilated on this unenhanced study.  The adrenal glands and spleen are unremarkable.  The stomach is largely decompressed and cannot be evaluated.  However, there is free intraperitoneal air present consistent with perforated viscus.  No  renal calculi are seen and there is no evidence of hydronephrosis. The aorta is normal in caliber with atheromatous change present.  The urinary bladder is decompressed with Foley catheter.  The uterus appears to have been resected.  There is a large amount of free air anteriorly within the pelvis.  Although there is no evidence of diverticulitis a colon perforation would be expected in view of the large amount of air present.  There is a moderate amount of feces throughout the colon.  Some feces in the anterior lower pelvis cannot definitely be confirmed to be within the lumen of the colon.  Some ascites layers within the pelvis.  An ischemic process cannot be excluded on this exam since no IV contrast media could be administered. The appendix is unremarkable.  No bony abnormality is seen.  IMPRESSION:  1.  Large amount of free intraperitoneal air consistent with perforated bowel.  No  abnormality of the stomach is seen and the perforation may involve the colon.  Some fecal material may not be within the lumen of the colon in the region of the mid anterior pelvis. 2.  Some free fluid is noted throughout the peritoneal cavity. 3.  Slightly prominent common bile duct.  Correlate with liver function tests.  Critical Value/emergent results were called by telephone at the time of interpretation on 10/04/2011  at 3:30 p.m.  to  Dr. Margarita Grizzle, who verbally acknowledged these results.  Original Report Authenticated By: Juline Patch, M.D.   Dg Chest Port 1 View  10/04/2011  *RADIOLOGY REPORT*  Clinical Data: 61 year old female with abdominal and lower chest pain.  Fever.  PORTABLE CHEST - 1 VIEW  Comparison: None.  Findings: Portable semi upright AP view the chest 1457 hours. Positive for pneumoperitoneum. Elevation of the diaphragm. Crowding of lung markings at both bases slightly worse on the left. Cardiac size and mediastinal contours are within normal limits. Visualized tracheal air column is within normal limits.   No pneumothorax or definite effusion.  Indistinct bowel gas in the visualized upper abdomen.  IMPRESSION: 1.  Positive for free air in the abdomen. Critical Value/emergent results were called by telephone at the time of interpretation on 10/04/2011  at 1508 hours  to  Dr. Margarita Grizzle, who verbally acknowledged these results. CT abdomen and pelvis is pending at the time of this dictation. 2.  Low lung volumes.  Patchy bibasilar opacity.  Original Report Authenticated By: Harley Hallmark, M.D.    Review of Systems  Constitutional: Negative.   HENT: Negative for neck pain.   Eyes: Negative.   Respiratory: Positive for shortness of breath. Negative for cough, hemoptysis, sputum production and wheezing.        DOE  Cardiovascular: Positive for chest pain (occasional, fairly vagure) and palpitations (occasional). Negative for orthopnea, claudication, leg swelling and PND.  Gastrointestinal: Positive for heartburn, nausea, abdominal pain and constipation (OCCASIONAL). Negative for vomiting, diarrhea, blood in stool and melena.  Genitourinary: Negative.   Musculoskeletal: Positive for back pain (CHRONIC BACK PAIN WITH HYDROCODONE AT HOME). Negative for myalgias, joint pain and falls.  Skin: Negative.   Neurological: Positive for headaches (hx migraines).  Endo/Heme/Allergies: Negative.   Psychiatric/Behavioral: Negative.     Blood pressure 139/71, pulse 119, temperature 103.3 F (39.6 C), temperature source Core (Comment), resp. rate 91, SpO2 91.00%. Physical Exam  Constitutional: She appears well-developed and well-nourished. She appears distressed.       ACUTELY ILL WF, DESATING ON NASAL CANNULA AT TIME OF INTERVIEW.  tEMP 103.3  HR 118 139/76  HENT:  Head: Normocephalic and atraumatic.  Eyes: Conjunctivae and EOM are normal. Pupils are equal, round, and reactive to light. Right eye exhibits no discharge. Left eye exhibits no discharge. No scleral icterus.  Neck: Normal range of motion. Neck  supple. No JVD present. No tracheal deviation present. No thyromegaly present.  Cardiovascular: Regular rhythm, normal heart sounds and intact distal pulses.  Exam reveals no gallop and no friction rub.   No murmur heard.      TACHYCARDIC HR 110'S   Respiratory: She is in respiratory distress (BREATHING HARD AND SAT'S DROPPING). She has no wheezes. She has no rales. She exhibits no tenderness.  GI: She exhibits distension. There is tenderness. There is rebound and guarding.       diffusely TENDER, TO ANY PALPATION, DISTENDED NO BOWEL SOUNDS.  Musculoskeletal: She exhibits edema (TRACE LE EDEMA).  Lymphadenopathy:  She has no cervical adenopathy.  Neurological: She is alert. No cranial nerve deficit.       SLIGHTLY CONFUSED NOW.  Skin: Skin is warm. No rash noted. She is diaphoretic. No erythema. There is pallor.  Psychiatric:       IN EXTREME DISCOMFORT AND DISTRESS     Assessment/Plan 1. Perforated viscus of uncertain etiology. 2.  Sepsis 3. COPD, history dyspnea on exertion, history of heavy tobacco use in the past. 4. Hypertension 5. History of migraines. 6. History of chronic back pain 7. Hypokalemia  Plan: Patient was seen and evaluated by Dr. Claud Kelp plan take patient to the operating room emergently. Risk and benefits were discussed with the patient and her family in detail. She's been started on antibiotics, volume replacement, and potassium replacement.  [facility] physician assistant for Dr. Claud Kelp         Nevena Rozenberg 10/04/2011, 4:08 PM

## 2011-10-04 NOTE — Consult Note (Signed)
Name: Stacy Dalton MRN: [id number] DOB: Jun 04, 1951    LOS: 0  PCCM CONSULTATION NOTE  History of Present Illness: 61 yo brought to [facility] ED on 4/3 with acute onset abdominal pain.  She was febrile and tachycardic. Imaging demonstrated large amount of intraperitoneal air.  Antibiotics were given.  She was taken to OR for emergency exploratory laparotomy which demonstrated perforated colon and diffuse peritonitis.  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  She was extubated in OR and brought to ICU for further management.  In ICU she was transiently hypotensive after being medicated with Dialudid but responded to fluids.  At this time she reports moderate incisional pain aggravated by breathing and mild nausea.   Lines / Drains: 4/3  Foley 4/3  NGT  Cultures: None  Antibiotics: 4/3  Zosyn x 1 4/3  Vancomycin 4/3  Ertapenem  Tests / Events: 4/3  Abdomen CT >>>  Free intraperitoneal air, fluid in peritoneal cavity, prominent bile duct 4/4  Elploratory laparotomy, colectomy, colostomy  Past Medical History  Diagnosis Date  . Migraine   . Hypertension    Past Surgical History  Procedure Date  . Tubal ligation   . Bladder tac    Prior to Admission medications   Medication Sig Start Date End Date Taking? Authorizing Provider  HYDROcodone-acetaminophen (NORCO) 5-325 MG per tablet Take 1 tablet by mouth every 6 (six) hours as needed. For pain relief   Yes Historical Provider, MD  Multiple Vitamin (MULITIVITAMIN WITH MINERALS) TABS Take 1 tablet by mouth daily.   Yes Historical Provider, MD   Allergies Allergies  Allergen Reactions  . Demerol   . Esgic (Butalbital-Apap-Caffeine)   . Iodine Other (See Comments)    bp bottomed out per pt several hours later; unsure if pre medicated in past with cm; done in [place]   Family History History reviewed. No pertinent family history.  Social History  reports that she quit smoking about 14 years ago. She has never used smokeless  tobacco. She reports that she does not drink alcohol or use illicit drugs.  Review Of Systems  Per HPI.  Vital Signs: Temp:  [98 F (36.7 C)-103.3 F (39.6 C)] 99.7 F (37.6 C) (04/03 2000) Pulse Rate:  [89-119] 105  (04/03 2025) Resp:  [17-91] 20  (04/03 2025) BP: (97-170)/(48-93) 112/53 mmHg (04/03 2025) SpO2:  [83 %-98 %] 92 % (04/03 2025) FiO2 (%):  [100 %] 100 % (04/03 1553) I/O last 3 completed shifts: In: 3180 [I.V.:3150; NG/GT:30] Out: 1100 [Urine:950; Blood:150]  Physical Examination: General:  Appears ill, pale, no acute distress Neuro:  Awake, alert, cooperative with exam HEENT:  NGT, dry membranes Neck:  Supple, no JVD   Cardiovascular:  RRR, no M/R/G Lungs:  Bilateral diminished air entry, no W/R/R Abdomen:  Clean surgical dressing, viable colostomy, tenderness to palpation, absent bowel sounds Musculoskeletal:  Moves all extremities, no pedal edema Skin:  No rash  Ventilator settings: Vent Mode:  [-]  FiO2 (%):  [100 %] 100 %  Labs and Imaging:  Reviewed.  Please refer to the Assessment and Plan section for relevant results.  ASSESSMENT AND PLAN  NEUROLOGIC A:  No issues.  Fair pain control but hypotensive with Dilaudid. P: -->  D/c Dilaudid / Morphine -->  Fentanyl PRN  PULMONARY No results found for this basename: PHART:5,PCO2:5,PCO2ART:5,PO2ART:5,HCO3:5,O2SAT:5 in the last 168 hours A:  Transient hypoxia, likely postop atelectasis. P: -->  Supplemental oxygen -->  Incentive spirometry -->  Pain control as above  CARDIOVASCULAR No results found for this basename: TROPONINI:5,LATICACIDVEN:5, O2SATVEN:5,PROBNP:5 in the last 168 hours A:  SIRS secondary to peritonitis.  Hypovolemia. P: -->  NS boluses -->  Goal MAP 60-65 -->  No indications for CVL / Vasopressors at this time  RENAL  Lab 10/04/11 1855 10/04/11 1345  NA 138 136  K 3.5 2.8*  CL 103 94*  CO2 22 29  BUN 11 16  CREATININE 0.65 0.77  CALCIUM 7.6* 9.3  MG -- --  PHOS -- --     A:  Normal renal function.  Hypokalemia - resolved. P: -->  BMP, Mg, Phos in AM -->  IVF  GASTROINTESTINAL  Lab 10/04/11 1345  AST 34  ALT 24  ALKPHOS 75  BILITOT 0.4  PROT 7.1  ALBUMIN 4.3   A:  Perforated viscus s/p colectomy / colostomy. P: -->  Per CCS  HEMATOLOGIC  Lab 10/04/11 1952 10/04/11 1855 10/04/11 1345  HGB 13.3 14.6 16.0*  HCT 40.4 44.9 47.8*  PLT 296 SPECIMEN CLOTTED 343  INR -- -- --  APTT -- -- --   A:  No active issues. P: -->  CBC in AM  INFECTIOUS  Lab 10/04/11 1952 10/04/11 1855 10/04/11 1345  WBC 1.7* 1.5* 4.6  PROCALCITON -- -- --   A:  Peritonitis.  Reactive leukopenia. P: -->  Check PCT -->  Continue Ertapenem -->  Consider d/c Vancomycin -->  If signs of sepsis would add Micafungin  ENDOCRINE No results found for this basename: GLUCAP:5 in the last 168 hours A:  No active issues. P: -->  No interventions required  BEST PRACTICE / DISPOSITION -->  ICU status under CCS -->  PCCM consulting -->  Full code -->  NPO -->  Heparin Grampian for DVT Px -->  Protonix IV for GI Px -->  Family updated at bedside  Orlean Bradford, M.D., F.C.C.P. Pulmonary and Critical Care Medicine [facility] Cell: [phone] Pager: [phone]  10/04/2011, 9:36 PM

## 2011-10-04 NOTE — H&P (Signed)
Patient interviewed and examined. X-ray studies reviewed. I agree with the evaluation and treatment plan as outlined by Zola Button, PA  See my dictated preoperative note.Angelia Mould. Derrell Lolling, M.D., [facility] General and Minimally invasive Surgery Breast and Colorectal Surgery Office:   [phone] Pager:   [phone]

## 2011-10-04 NOTE — Anesthesia Postprocedure Evaluation (Signed)
  Anesthesia Post-op Note  Patient: Stacy Dalton  Procedure(s) Performed: Procedure(s) (LRB): EXPLORATORY LAPAROTOMY (N/A)  Patient Location: PACU  Anesthesia Type: General  Level of Consciousness: awake and alert   Airway and Oxygen Therapy: Patient Spontanous Breathing  Post-op Pain: mild  Post-op Assessment: Post-op Vital signs reviewed, Patient's Cardiovascular Status Stable, Respiratory Function Stable, Patent Airway and No signs of Nausea or vomiting  Post-op Vital Signs: stable  Complications: No apparent anesthesia complications

## 2011-10-04 NOTE — Op Note (Signed)
Patient Name:           Stacy Dalton   Date of Surgery:        10/04/2011  Pre op Diagnosis:      Perforated viscus  Post op Diagnosis:    Stercoral perforation left colon with diffuse peritonitis  Procedure:                 Exploratory laparotomy, left colectomy, closure distal segment, and descending colostomy  Surgeon:                     Angelia Mould. Derrell Lolling, M.D., FACS  Assistant:                      Zola Button, [place]  Operative Indications:   This is a 61 year old Caucasian female with hypertension, former smoker, intermittent constipation. At 11 AM this morning she had the sudden onset of abdominal pain which was severe and unrelenting. She came to the emergency room where she was evaluated by the emergency department physician. Chest x-ray showed large pneumoperitoneum. CT scan showed large pneumoperitoneum, but no focal inflammatory process. On review of the CT [place] questioning whether there might be some stool balls outside the lumen of the colon. Examination revealed febrile, obtundent patient with obvious diffuse peritonitis. She was resuscitated and brought to the operating room emergently  Operative Findings:     There was a stercoral perforation of the distal descending colon. There was a large hole in the antimesenteric wall of the distal descending colon but there was no acute inflammation or diverticulitis. There was a lot of liquid stool and numerous hard stool balls throughout the mid and lower abdomen. The proximal colon was a little bit dilated. The cecum and right colon showed no evidence of ischemia or perforation. There was a lot of pasty stool around the splenic flexure and the proximal descending colon. We milked some of this out but most of it had to be left behind. After resecting the disease segment we found that the distal segment, which was more Prolene sutures, was very long composed most of the sigmoid colon. The small bowel and appendix looked normal. The liver  gallbladder and stomach looked normal. The spleen felt normal in size.    Procedure in Detail:          Following the induction of general endotracheal anesthesia a nasogastric tube was placed. She already had a Foley catheter. Surgical time out was performed. The abdomen was prepped and draped in a sterile fashion. A midline laparotomy incision was made around the umbilicus. The fascia was incised in the midline. Once we did determine that it was stool contamination we extended the incision inferiorly. The abdomen was explored with findings as described above. We thoroughly explored the abdomen removing numerous stool balls. We identified the source of perforation in the distal descending colon. We mobilized the descending colon thoroughly by dividing its lateral peritoneal attachments. We resected the perforated segment by using a GIA stapler proximally and distally and the LigaSure device on the mesentery. We took the mesentery down a little bit so that the colostomy would come out the abdominal wall. We removed the specimen.  We spent a  long time irrigating the subphrenic spaces, abdominal spaces and pelvic spaces. I probably irrigated 10 or 12 L of saline. At the completion of irrigation the fluid looked pretty clear and we could not find any other stool balls anywhere. I ran  the small bowel and it looked fine. We looked at the colon but right side transverse and descending and rectum and there was no other evidence of any perforation. There was some stool proximally and distally consistent with chronic constipation. After satisfying ourselves that we had removed the fecal contamination we brought out a colostomy in the left abdominal wall and a suitable place through the rectus sheath. I cut a button of skin, debrided the fat, incised the anterior rectus sheath in a cruciate fashion, and incised the posterior rectus sheath and dilated the tract so  it would admit 2 fingers. We very carefully brought the  colostomy out making sure that we didn't twist it or turn in any way. We checked carefully for laparotomy sponges several times and did preliminary and final counts which were correct.The colon was pink and viable. We returned the transverse colon and omentum to their anatomic positions. The midline fascia was closed with a running suture of #1 double-stranded PDS and also used several interrupted sutures of #1 Novafil. The skin was left open and packed with saline gauze. I amputated the staple line on the colostomy and matured the colostomy with numerous interrupted sutures of 3-0 Vicryl. Colostomy bled well and was pink and healthy. I could pass my finger down into the colostomy and past the fascia and so there were did not appear to be any obstruction or stricture. Colostomy bag was placed. Dry bandages were placed. The patient tolerated the procedure was taken recovery room in reasonably hemodynamic and stable condition. EBL 150 cc. Counts correct. No complications.     Angelia Mould. Derrell Lolling, M.D., FACS General and Minimally Invasive Surgery Breast and Colorectal Surgery  10/04/2011 6:14 PM

## 2011-10-04 NOTE — ED Notes (Signed)
Per ems: pt is c/o abdominal pain started about a hour ago. Pt has an iv lac. Pt had several emesis one on transport

## 2011-10-04 NOTE — Progress Notes (Signed)
ANTIBIOTIC CONSULT NOTE - INITIAL  Pharmacy Consult for Vancomycin Indication: Perforated bowel s/p ex lap  Allergies  Allergen Reactions  . Demerol   . Esgic (Butalbital-Apap-Caffeine)   . Iodine Other (See Comments)    bp bottomed out per pt several hours later; unsure if pre medicated in past with cm; done in [place]    Patient Measurements:   Weight (10/04/11) on ICU bed: 69 kg Height (estimated): 67"  Vital Signs: Temp: 98 F (36.7 C) (04/03 1821) Temp src: Core (Comment) (04/03 1453) BP: 103/57 mmHg (04/03 1900) Pulse Rate: 93  (04/03 1900)  Labs:  Basename 10/04/11 1345  WBC 4.6  HGB 16.0*  PLT 343  LABCREA --  CREATININE 0.77   CrCl is unknown because there is no height on file for the current visit. No results found for this basename: VANCOTROUGH:2,VANCOPEAK:2,VANCORANDOM:2,GENTTROUGH:2,GENTPEAK:2,GENTRANDOM:2,TOBRATROUGH:2,TOBRAPEAK:2,TOBRARND:2,AMIKACINPEAK:2,AMIKACINTROU:2,AMIKACIN:2, in the last 72 hours   Microbiology: No results found for this or any previous visit (from the past 720 hour(s)).  Medical History: Past Medical History  Diagnosis Date  . Migraine   . Hypertension    Assessment: 61yof with stercoral perforation left colon with diffuse peritonitis s/p exploratory laparotomy.  Patient received Zosyn pre-op and will be continuing zosyn with vancomycin post-op.  CrCl~81 ml/min, CrCl(N)~85 ml/min.  Goal of Therapy:  Vancomycin trough level 15-20 mcg/ml  Plan:  Vancomycin 750g IV q8h. Obtain trough at steady state.  Clance Boll 10/04/2011,7:14 PM

## 2011-10-04 NOTE — Progress Notes (Signed)
General surgery attending note:  This patient has been interviewed and examined in the emergency department.  Sudden onset non-localizing abdominal pain at 11 AM today. No past history of ulcer or stomach disease. No past history of colonic disease. Never had a colonoscopy. Alert on arrival in the ED.  Exam:  diffuse peritonitis on exam, non-localizing and mental obtundation which may be a narcotic and may be impending sepsis.  Chest x-ray shows massive pneumoperitoneum under both diaphragms. CT scan without contrast  confirms pneumoperitoneum but does not demonstrate a focal inflammatory process.  Assessment: Perforated viscus with impending sepsis. This may be foregut or hindgut in etiology.                         HTN                         Former smoker  Plan: The patient will be taken emergently to the operating room for exploratory laparotomy and repair of perforated viscus.  I've discussed the indications and details of the surgery with the patient, who has limited understanding, the husband and the brother. Differential diagnosis was discussed including perforated ulcer, perforated small bowel, perforated colon. Possibilities of temporary or permanent colostomy, blood loss, postop ventilatory dependence were discussed. They are aware that this is a life-threatening event and that there is no definitive alternatives to surgical intervention. Their questions were answered. They understand these issues. They agree with this plan.   Angelia Mould. Derrell Lolling, M.D., [facility] General and Minimally invasive Surgery Breast and Colorectal Surgery Office:   [phone] Pager:   [phone]

## 2011-10-04 NOTE — ED Provider Notes (Signed)
History     CSN: [id number]  Arrival date & time 10/04/11  1229   First MD Initiated Contact with Patient 10/04/11 1419      Chief Complaint  Patient presents with  . Abdominal Pain    (Consider location/radiation/quality/duration/timing/severity/associated sxs/prior treatment) HPI  Patient with acute onset of diffuse abdominal pain at 1100 a.m. Patient with nausea and vomiting.  Patient states food in emesis, no blood.  Patient with constipation recently.  No fever.  Patient with chills here.  Pain is severe and like big cramp and feel sob.    History reviewed. No pertinent past medical history.  History reviewed. No pertinent past surgical history.  No family history on file.  History  Substance Use Topics  . Smoking status: Never Smoker   . Smokeless tobacco: Never Used  . Alcohol Use: No    OB History    Grav Para Term Preterm Abortions TAB SAB Ect Mult Living                  Review of Systems  All other systems reviewed and are negative.    Allergies  Review of patient's allergies indicates no known allergies.  Home Medications   Current Outpatient Rx  Name Route Sig Dispense Refill  . HYDROCODONE-ACETAMINOPHEN 5-325 MG PO TABS Oral Take 1 tablet by mouth every 6 (six) hours as needed. For pain relief    . ADULT MULTIVITAMIN W/MINERALS CH Oral Take 1 tablet by mouth daily.      BP 145/70  Pulse 90  Temp(Src) 98.7 F (37.1 C) (Oral)  Resp 24  SpO2 96%  Physical Exam  Nursing note and vitals reviewed. Constitutional: She is oriented to person, place, and time. She appears well-developed and well-nourished.  HENT:  Head: Normocephalic and atraumatic.  Mouth/Throat: Oropharyngeal exudate: pale conjunctiva.  Eyes: Pupils are equal, round, and reactive to light.  Neck: Normal range of motion. Neck supple.  Cardiovascular: Tachycardia present.   Pulmonary/Chest:       No bowel sounds, abdomen distended and diffusely tender  Musculoskeletal: Normal  range of motion.  Neurological: She is alert and oriented to person, place, and time.  Skin: Skin is warm and dry.  Psychiatric: She has a normal mood and affect.    ED Course  Procedures (including critical care time)   Labs Reviewed  CBC  DIFFERENTIAL  COMPREHENSIVE METABOLIC PANEL  LIPASE, BLOOD  URINALYSIS, ROUTINE W REFLEX MICROSCOPIC   No results found.   No diagnosis found.    MDM  Patient's plain film results and CT of the abdomen discussed with radiologist. I have spoken with general surgery with Will PA. He and Dr. Derrell Lolling will be down to assess the patient. She has remained n.p.o. She is given Zosyn 3.75 mg IV. She has had 2 L of normal saline and blood pressure remains in mid 100s. She is being given a third liter of normal saline. Patient began.  having chills and spiked fever to 103 and became tachycardiac.  Dr. Derrell Lolling at bedside.   Patient with free air in abdomen c.w. Perforated large bowel on ct.  Patient had ct done at same time as plain film as pain seen at triage with Mr. Laveda Norman and initial orders placed from triage.  CRITICAL CARE Performed by: Hilario Quarry   Total critical care time: 45  Critical care time was exclusive of separately billable procedures and treating other patients.  Critical care was necessary to treat or prevent imminent or life-threatening  deterioration.  Critical care was time spent personally by me on the following activities: development of treatment plan with patient and/or surrogate as well as nursing, discussions with consultants, evaluation of patient's response to treatment, examination of patient, obtaining history from patient or surrogate, ordering and performing treatments and interventions, ordering and review of laboratory studies, ordering and review of radiographic studies, pulse oximetry and re-evaluation of patient's condition.       Hilario Quarry, MD 10/09/11 1343

## 2011-10-04 NOTE — ED Provider Notes (Signed)
Medical screening examination/treatment/procedure(s) were performed by non-physician practitioner and as supervising physician I was immediately available for consultation/collaboration.  Doug Sou, MD 10/04/11 1424

## 2011-10-04 NOTE — ED Provider Notes (Signed)
Pt originally screened by me.  Pt w/ hx of HTN presents with acute onset of diffused abd pain, with associated N/V.  Last BM 2 days ago.  Also c/o SOB.  Is a former smoker.  On exam, pt appears in mild res distress.  Speaking 5-10 words sentences, accessory muscle use.  Satting @ 92-94% on 2L.  Decreased breath sound throughout, Abd distended on tender on exam, with peritoneal sign.  CBC, CMP, lipase order.  Upright abd xray ordered.  Pain medication and antiemetic ordered.  O2 mask initiated.    Fayrene Helper, PA-C 10/04/11 1334

## 2011-10-04 NOTE — ED Notes (Signed)
Pt state she started to have abdominal pain this morning. Pt states she has had several emesis. Pt state she has been constipated for 2 days. Pt abdomen noted to be distended and tender to touch

## 2011-10-04 NOTE — Transfer of Care (Signed)
Immediate Anesthesia Transfer of Care Note  Patient: Stacy Dalton  Procedure(s) Performed: Procedure(s) (LRB): EXPLORATORY LAPAROTOMY (N/A)  Patient Location: PACU  Anesthesia Type: General  Level of Consciousness: awake, sedated and patient cooperative  Airway & Oxygen Therapy: Patient Spontanous Breathing and Patient connected to face mask oxygen  Post-op Assessment: Report given to PACU RN and Post -op Vital signs reviewed and stable  Post vital signs: Reviewed and stable  Complications: No apparent anesthesia complications

## 2011-10-04 NOTE — ED Notes (Signed)
PA Bowie asked for patient to be placed on mask instead of nasal cannula. Task completed.

## 2011-10-05 DIAGNOSIS — E876 Hypokalemia: Secondary | ICD-10-CM

## 2011-10-05 DIAGNOSIS — J9819 Other pulmonary collapse: Secondary | ICD-10-CM

## 2011-10-05 DIAGNOSIS — K631 Perforation of intestine (nontraumatic): Secondary | ICD-10-CM | POA: Diagnosis present

## 2011-10-05 DIAGNOSIS — R6510 Systemic inflammatory response syndrome (SIRS) of non-infectious origin without acute organ dysfunction: Secondary | ICD-10-CM | POA: Diagnosis present

## 2011-10-05 DIAGNOSIS — K659 Peritonitis, unspecified: Secondary | ICD-10-CM | POA: Diagnosis present

## 2011-10-05 DIAGNOSIS — J9600 Acute respiratory failure, unspecified whether with hypoxia or hypercapnia: Secondary | ICD-10-CM

## 2011-10-05 LAB — CBC
HCT: 39.8 % (ref 36.0–46.0)
Hemoglobin: 13.1 g/dL (ref 12.0–15.0)
MCH: 29.8 pg (ref 26.0–34.0)
MCHC: 32.9 g/dL (ref 30.0–36.0)
MCV: 90.5 fL (ref 78.0–100.0)
Platelets: 242 10*3/uL (ref 150–400)
RBC: 4.4 MIL/uL (ref 3.87–5.11)
RDW: 12.5 % (ref 11.5–15.5)
WBC: 3.7 10*3/uL — ABNORMAL LOW (ref 4.0–10.5)

## 2011-10-05 LAB — BASIC METABOLIC PANEL
BUN: 9 mg/dL (ref 6–23)
BUN: 9 mg/dL (ref 6–23)
CO2: 27 mEq/L (ref 19–32)
CO2: 25 mEq/L (ref 19–32)
Calcium: 7.4 mg/dL — ABNORMAL LOW (ref 8.4–10.5)
Calcium: 7.7 mg/dL — ABNORMAL LOW (ref 8.4–10.5)
Chloride: 104 mEq/L (ref 96–112)
Chloride: 105 mEq/L (ref 96–112)
Creatinine, Ser: 0.68 mg/dL (ref 0.50–1.10)
Creatinine, Ser: 0.58 mg/dL (ref 0.50–1.10)
GFR calc Af Amer: >90 mL/min (ref >90)
GFR calc Af Amer: >90 mL/min (ref >90)
GFR calc non Af Amer: >90 mL/min (ref >90)
GFR calc non Af Amer: >90 mL/min (ref >90)
Glucose, Bld: 125 mg/dL — ABNORMAL HIGH (ref 70–99)
Glucose, Bld: 94 mg/dL (ref 70–99)
Potassium: 2.8 mEq/L — ABNORMAL LOW (ref 3.5–5.1)
Potassium: 4.5 mEq/L (ref 3.5–5.1)
Sodium: 138 mEq/L (ref 135–145)
Sodium: 135 mEq/L (ref 135–145)

## 2011-10-05 LAB — PROTIME-INR
INR: 1.22 (ref 0.00–1.49)
Prothrombin Time: 15.7 seconds — ABNORMAL HIGH (ref 11.6–15.2)

## 2011-10-05 LAB — MAGNESIUM: Magnesium: 2 mg/dL (ref 1.5–2.5)

## 2011-10-05 LAB — PHOSPHORUS: Phosphorus: 2.3 mg/dL (ref 2.3–4.6)

## 2011-10-05 LAB — APTT: aPTT: 35 seconds (ref 24–37)

## 2011-10-05 LAB — PROCALCITONIN: Procalcitonin: 17.61 ng/mL

## 2011-10-05 MED ORDER — MENTHOL 3 MG MT LOZG
1.0000 | LOZENGE | OROMUCOSAL | Status: DC | PRN
  Administered 2011-10-05: 3 mg via ORAL
  Filled 2011-10-05: qty 9

## 2011-10-05 MED ORDER — POTASSIUM CHLORIDE 10 MEQ/100ML IV SOLN
10.0000 meq | INTRAVENOUS | Status: AC
  Administered 2011-10-05 (×6): 10 meq via INTRAVENOUS
  Filled 2011-10-05: qty 100
  Filled 2011-10-05: qty 400
  Filled 2011-10-05: qty 100

## 2011-10-05 MED ORDER — SODIUM CHLORIDE 0.9 % IV BOLUS (SEPSIS)
500.0000 mL | Freq: Once | INTRAVENOUS | Status: AC
  Administered 2011-10-05: 500 mL via INTRAVENOUS

## 2011-10-05 MED ORDER — POTASSIUM CHLORIDE 10 MEQ/100ML IV SOLN: 10.0000 meq | INTRAVENOUS | Status: DC

## 2011-10-05 NOTE — Consult Note (Deleted)
Name: Stacy Dalton MRN: [id number] DOB: 10/14/1950    LOS: 1  PCCM CONSULTATION NOTE  History of Present Illness: 61 y/o F brought to [facility] ED on 4/3 with acute onset abdominal pain.  She was febrile and tachycardic. Imaging demonstrated large amount of intraperitoneal air.  Antibiotics were given.  She was taken to OR for emergency exploratory laparotomy which demonstrated perforated colon and diffuse peritonitis.  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  She was extubated in OR and brought to ICU for further management.  In ICU she was transiently hypotensive after being medicated with Dialudid but responded to fluids.     Lines / Drains: 4/3  Foley 4/3  NGT  Cultures: None  Antibiotics: 4/3  Zosyn x 1 4/3  Vancomycin 4/3  Ertapenem  Tests / Events: 4/3  Abdomen CT >>>  Free intraperitoneal air, fluid in peritoneal cavity, prominent bile duct 4/4  Elploratory laparotomy, colectomy, colostomy    Subjective:   Patient c/o's abd pain, denies SOB, chest pain, n/v.    Vital Signs: Filed Vitals:   10/05/11 0400 10/05/11 0500 10/05/11 0700 10/05/11 0800  BP: 104/58 114/59 106/58 109/61  Pulse: 106 105 108 110  Temp: 100.8 F (38.2 C) 100.8 F (38.2 C) 100.8 F (38.2 C) 100.8 F (38.2 C)  TempSrc:      Resp: 21 29 27 27   Height:      Weight:      SpO2: 96% 97% 97% 97%    I/O last 3 completed shifts: In: 5030 [I.V.:4650; NG/GT:30; IV Piggyback:350] Out: 2061 [Urine:1900; Emesis/NG output:5; Stool:6; Blood:150]  Physical Examination: General:  Appears ill, pale, no acute distress Neuro:  Awake, alert, cooperative with exam HEENT:  NGT, dry membranes Neck:  Supple, no JVD   Cardiovascular:  RRR, no M/R/G Lungs:  Bilateral diminished air entry, resp's even/non-labored Abdomen:  Clean surgical dressing, viable colostomy, tenderness to palpation, absent bowel sounds Musculoskeletal:  Moves all extremities, no pedal edema Skin:  No rash   Labs and Imaging:   Reviewed.  Please refer to the Assessment and Plan section for relevant results.      ASSESSMENT AND PLAN  NEUROLOGIC A:  No issues.  Fair pain control but hypotensive with Dilaudid. P: -->  D/c Dilaudid / Morphine -->  Fentanyl PRN for pain  PULMONARY A:  Transient hypoxia, likely postop atelectasis. P: -->  Supplemental oxygen -->  Incentive spirometry -->  Pain control as above -->  Follow up cxr in am   CARDIOVASCULAR A:  SIRS secondary to peritonitis.  Hypovolemia. P: -->  NS boluses -->  Goal MAP 60-65 -->  No indications for CVL / Vasopressors at this time  RENAL  Lab 10/05/11 0347 10/04/11 1855 10/04/11 1345  NA 138 138 136  K 2.8* 3.5 --  CL 104 103 94*  CO2 27 22 29   BUN 9 11 16   CREATININE 0.68 0.65 0.77  CALCIUM 7.4* 7.6* 9.3  MG 2.0 -- --  PHOS 2.3 -- --   A:  Normal renal function.  Hypokalemia - resolved. P: -->  BMP, Mg, Phos in AM -->  IVF -->  Replete K+ am 4/4, f/u bmp evening 4/4  GASTROINTESTINAL  Lab 10/04/11 1345  AST 34  ALT 24  ALKPHOS 75  BILITOT 0.4  PROT 7.1  ALBUMIN 4.3   A:  Perforated viscus s/p colectomy / colostomy. P: -->  Per CCS  HEMATOLOGIC  Lab 10/05/11 0347 10/04/11 1952 10/04/11 1855 10/04/11 1345  HGB  13.1 13.3 14.6 16.0*  HCT 39.8 40.4 44.9 47.8*  PLT 242 296 SPECIMEN CLOTTED 343  INR 1.22 -- -- --  APTT 35 -- -- --   A:  No active issues. P: -->  CBC in AM  INFECTIOUS  Lab 10/05/11 0347 10/05/11 0001 10/04/11 1952 10/04/11 1855 10/04/11 1345  WBC 3.7* -- 1.7* 1.5* 4.6  PROCALCITON -- 17.61 -- -- --   A:  Peritonitis.  Reactive leukopenia. P: -->  Check PCT -->  Continue Ertapenem -->  Consider d/c Vancomycin -->  If signs of sepsis would add Micafungin  ENDOCRINE A:  No active issues. P: -->  No interventions required  BEST PRACTICE / DISPOSITION -->  ICU status under CCS -->  PCCM consulting -->  Full code -->  NPO -->  Heparin [place] for DVT Px -->  Protonix IV for GI Px -->   Family updated at bedside    Canary Brim, NP-C [facility] Pgr: [phone]

## 2011-10-05 NOTE — Progress Notes (Signed)
K+ this am 2.8, supplemented per IV. 1600 K+ 4.5. Assisted up to chair, sat up >5 hours. Received fentanyl 50 mcg IV throughout the shift for pain. Bolus NS given for bp in 90s.  On n/c O2, instructed to TCDB, 1750 per incentive spirometer.

## 2011-10-05 NOTE — Progress Notes (Signed)
1 Day Post-Op  Subjective: More alertnd oriented. Still has significant abdominal pain. Shallow breathing. Good urine output. Otherwise stable and does not appear to be in any acute respiratory distress.  Potassium 2.8. Creatinine 0.68. BUN 9. WBC up to 3.7. Hemoglobin 13.1.  Objective: Vital signs in last 24 hours: Temp:  [98 F (36.7 C)-103.3 F (39.6 C)] 100.8 F (38.2 C) (04/04 0400) Pulse Rate:  [89-119] 106  (04/04 0400) Resp:  [15-91] 21  (04/04 0400) BP: (87-170)/(46-93) 104/58 mmHg (04/04 0400) SpO2:  [83 %-98 %] 96 % (04/04 0400) FiO2 (%):  [100 %] 100 % (04/03 1553) Weight:  [149 lb 0.5 oz (67.6 kg)] 149 lb 0.5 oz (67.6 kg) (04/03 2000)    Intake/Output from previous day: 04/03 0701 - 04/04 0700 In: 5030 [I.V.:4650; NG/GT:30; IV Piggyback:350] Out: 2061 [Urine:1900; Emesis/NG output:5; Stool:6; Blood:150] Intake/Output this shift: Total I/O In: 1850 [I.V.:1500; IV Piggyback:350] Out: 961 [Urine:950; Emesis/NG output:5; Stool:6]  General appearance: alert. Mild to moderate distress from abdominal pain. Husband at bedside. Resp: lungs basically clear to auscultation anteriorly. Decreased breath sounds bases. Diminished  respiratory excursion. No wheeze. GI: incision clean and dry. Colostomy pink and viable. Moderate distention. Appropriate diffuse tenderness. No bowel sounds.  Lab Results:  Results for orders placed during the hospital encounter of 10/04/11 (from the past 24 hour(s))  CBC     Status: Abnormal   Collection Time   10/04/11  1:45 PM      Component Value Range   WBC 4.6  4.0 - 10.5 (K/uL)   RBC 5.25 (*) 3.87 - 5.11 (MIL/uL)   Hemoglobin 16.0 (*) 12.0 - 15.0 (g/dL)   HCT 16.1 (*) 09.6 - 46.0 (%)   MCV 91.0  78.0 - 100.0 (fL)   MCH 30.5  26.0 - 34.0 (pg)   MCHC 33.5  30.0 - 36.0 (g/dL)   RDW 04.5  40.9 - 81.1 (%)   Platelets 343  150 - 400 (K/uL)  DIFFERENTIAL     Status: Abnormal   Collection Time   10/04/11  1:45 PM      Component Value Range   Neutrophils Relative 78 (*) 43 - 77 (%)   Neutro Abs 3.6  1.7 - 7.7 (K/uL)   Lymphocytes Relative 18  12 - 46 (%)   Lymphs Abs 0.8  0.7 - 4.0 (K/uL)   Monocytes Relative 4  3 - 12 (%)   Monocytes Absolute 0.2  0.1 - 1.0 (K/uL)   Eosinophils Relative 0  0 - 5 (%)   Eosinophils Absolute 0.0  0.0 - 0.7 (K/uL)   Basophils Relative 0  0 - 1 (%)   Basophils Absolute 0.0  0.0 - 0.1 (K/uL)  COMPREHENSIVE METABOLIC PANEL     Status: Abnormal   Collection Time   10/04/11  1:45 PM      Component Value Range   Sodium 136  135 - 145 (mEq/L)   Potassium 2.8 (*) 3.5 - 5.1 (mEq/L)   Chloride 94 (*) 96 - 112 (mEq/L)   CO2 29  19 - 32 (mEq/L)   Glucose, Bld 116 (*) 70 - 99 (mg/dL)   BUN 16  6 - 23 (mg/dL)   Creatinine, Ser 9.14  0.50 - 1.10 (mg/dL)   Calcium 9.3  8.4 - 78.2 (mg/dL)   Total Protein 7.1  6.0 - 8.3 (g/dL)   Albumin 4.3  3.5 - 5.2 (g/dL)   AST 34  0 - 37 (U/L)   ALT 24  0 -  35 (U/L)   Alkaline Phosphatase 75  39 - 117 (U/L)   Total Bilirubin 0.4  0.3 - 1.2 (mg/dL)   GFR calc non Af Amer 88 (*) >90 (mL/min)   GFR calc Af Amer >90  >90 (mL/min)  LIPASE, BLOOD     Status: Normal   Collection Time   10/04/11  1:45 PM      Component Value Range   Lipase 20  11 - 59 (U/L)  OCCULT BLOOD, POC DEVICE     Status: Normal   Collection Time   10/04/11  2:34 PM      Component Value Range   Fecal Occult Bld NEGATIVE    URINALYSIS, ROUTINE W REFLEX MICROSCOPIC     Status: Normal   Collection Time   10/04/11  2:48 PM      Component Value Range   Color, Urine YELLOW  YELLOW    APPearance CLEAR  CLEAR    Specific Gravity, Urine 1.019  1.005 - 1.030    pH 7.5  5.0 - 8.0    Glucose, UA NEGATIVE  NEGATIVE (mg/dL)   Hgb urine dipstick NEGATIVE  NEGATIVE    Bilirubin Urine NEGATIVE  NEGATIVE    Ketones, ur NEGATIVE  NEGATIVE (mg/dL)   Protein, ur NEGATIVE  NEGATIVE (mg/dL)   Urobilinogen, UA 0.2  0.0 - 1.0 (mg/dL)   Nitrite NEGATIVE  NEGATIVE    Leukocytes, UA NEGATIVE  NEGATIVE   CBC     Status:  Abnormal   Collection Time   10/04/11  6:55 PM      Component Value Range   WBC 1.5 (*) 4.0 - 10.5 (K/uL)   RBC 4.81  3.87 - 5.11 (MIL/uL)   Hemoglobin 14.6  12.0 - 15.0 (g/dL)   HCT 16.1  09.6 - 04.5 (%)   MCV 93.3  78.0 - 100.0 (fL)   MCH 30.4  26.0 - 34.0 (pg)   MCHC 32.5  30.0 - 36.0 (g/dL)   RDW 40.9  81.1 - 91.4 (%)   Platelets SPECIMEN CLOTTED  150 - 400 (K/uL)  BASIC METABOLIC PANEL     Status: Abnormal   Collection Time   10/04/11  6:55 PM      Component Value Range   Sodium 138  135 - 145 (mEq/L)   Potassium 3.5  3.5 - 5.1 (mEq/L)   Chloride 103  96 - 112 (mEq/L)   CO2 22  19 - 32 (mEq/L)   Glucose, Bld 122 (*) 70 - 99 (mg/dL)   BUN 11  6 - 23 (mg/dL)   Creatinine, Ser 7.82  0.50 - 1.10 (mg/dL)   Calcium 7.6 (*) 8.4 - 10.5 (mg/dL)   GFR calc non Af Amer >90  >90 (mL/min)   GFR calc Af Amer >90  >90 (mL/min)  CBC     Status: Abnormal   Collection Time   10/04/11  7:52 PM      Component Value Range   WBC 1.7 (*) 4.0 - 10.5 (K/uL)   RBC 4.35  3.87 - 5.11 (MIL/uL)   Hemoglobin 13.3  12.0 - 15.0 (g/dL)   HCT 95.6  21.3 - 08.6 (%)   MCV 92.9  78.0 - 100.0 (fL)   MCH 30.6  26.0 - 34.0 (pg)   MCHC 32.9  30.0 - 36.0 (g/dL)   RDW 57.8  46.9 - 62.9 (%)   Platelets 296  150 - 400 (K/uL)  MRSA PCR SCREENING     Status: Normal   Collection Time  10/04/11  8:48 PM      Component Value Range   MRSA by PCR NEGATIVE  NEGATIVE   PROCALCITONIN     Status: Normal   Collection Time   10/05/11 12:01 AM      Component Value Range   Procalcitonin 17.61    BASIC METABOLIC PANEL     Status: Abnormal   Collection Time   10/05/11  3:47 AM      Component Value Range   Sodium 138  135 - 145 (mEq/L)   Potassium 2.8 (*) 3.5 - 5.1 (mEq/L)   Chloride 104  96 - 112 (mEq/L)   CO2 27  19 - 32 (mEq/L)   Glucose, Bld 125 (*) 70 - 99 (mg/dL)   BUN 9  6 - 23 (mg/dL)   Creatinine, Ser 4.54  0.50 - 1.10 (mg/dL)   Calcium 7.4 (*) 8.4 - 10.5 (mg/dL)   GFR calc non Af Amer >90  >90 (mL/min)   GFR calc  Af Amer >90  >90 (mL/min)  CBC     Status: Abnormal   Collection Time   10/05/11  3:47 AM      Component Value Range   WBC 3.7 (*) 4.0 - 10.5 (K/uL)   RBC 4.40  3.87 - 5.11 (MIL/uL)   Hemoglobin 13.1  12.0 - 15.0 (g/dL)   HCT 09.8  11.9 - 14.7 (%)   MCV 90.5  78.0 - 100.0 (fL)   MCH 29.8  26.0 - 34.0 (pg)   MCHC 32.9  30.0 - 36.0 (g/dL)   RDW 82.9  56.2 - 13.0 (%)   Platelets 242  150 - 400 (K/uL)  APTT     Status: Normal   Collection Time   10/05/11  3:47 AM      Component Value Range   aPTT 35  24 - 37 (seconds)  PROTIME-INR     Status: Abnormal   Collection Time   10/05/11  3:47 AM      Component Value Range   Prothrombin Time 15.7 (*) 11.6 - 15.2 (seconds)   INR 1.22  0.00 - 1.49      Studies/Results: @RISRSLT24 @     . acetaminophen  650 mg Rectal Once  . ertapenem (INVANZ) IV  1 g Intravenous Q24H  . heparin  5,000 Units Subcutaneous Q8H  . HYDROmorphone      . HYDROmorphone      .  HYDROmorphone (DILAUDID) injection  1 mg Intravenous Once  .  HYDROmorphone (DILAUDID) injection  1 mg Intravenous Once  .  morphine injection  4 mg Intravenous Once  . ondansetron  4 mg Intravenous Once  . ondansetron (ZOFRAN) IV  4 mg Intravenous Once  . pantoprazole (PROTONIX) IV  40 mg Intravenous Q24H  . piperacillin-tazobactam (ZOSYN)  IV  3.375 g Intravenous Once  . potassium chloride  10 mEq Intravenous Once  . promethazine  25 mg Intravenous Once  . sodium chloride  1,000 mL Intravenous Once  . sodium chloride  1,000 mL Intravenous Once  . sodium chloride  1,000 mL Intravenous Once  . sodium chloride  500 mL Intravenous Once  . vancomycin  750 mg Intravenous Q8H  . vitamin A & D      . DISCONTD: piperacillin-tazobactam (ZOSYN)  IV  3.375 g Intravenous Q8H     Assessment/Plan: s/p Procedure(s): EXPLORATORY LAPAROTOMY, LEFT COLECTOMY WITH COLOSTOMY  POD #1. Reasonably stable. Due to fecal contamination and diffuse peritonitis, she is at risk for septic complications. ICU  care. Mobilize out of bed.   Incentive spirometry.  Wound and ostomy consultation requested for stoma care and education, and consider midline wound VAC.  Hypokalemia, will treat with KCl bolus.    LOS: 1 day    Commodore Bellew M. Derrell Lolling, M.D., [facility] General and Minimally invasive Surgery Breast and Colorectal Surgery Office:   [phone] Pager:   [phone]  10/05/2011  . .prob

## 2011-10-05 NOTE — Progress Notes (Signed)
Name: Stacy Dalton MRN: [id number] DOB: Sep 02, 1950    LOS: 1  PCCM PROGRESS NOTE  History of Present Illness: 53 y;owf  to [facility] ED on 4/3 with acute onset abdominal pain.  She was febrile and tachycardic. Imaging demonstrated large amount of intraperitoneal air.  Antibiotics were given.  She was taken to OR for emergency exploratory laparotomy which demonstrated perforated colon and diffuse peritonitis.  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  She was extubated in OR and brought to ICU for further management.  In ICU she was transiently hypotensive after being medicated with Dialudid but responded to fluids.     Lines / Drains: 4/3  Foley 4/3  NGT  Cultures: None  Antibiotics: 4/3  Zosyn x 1 4/3  Vancomycin > 4/4 4/3  Ertapenem(peritonitis) >>>  Tests / Events: 4/3  Abdomen CT >>>  Free intraperitoneal air, fluid in peritoneal cavity, prominent bile duct 4/4  Elploratory laparotomy, colectomy, colostomy    Subjective:   Patient c/o's abd pain, denies SOB, chest pain, n/v.    Vital Signs: Filed Vitals:   10/05/11 0400 10/05/11 0500 10/05/11 0700 10/05/11 0800  BP: 104/58 114/59 106/58 109/61  Pulse: 106 105 108 110  Temp: 100.8 F (38.2 C) 100.8 F (38.2 C) 100.8 F (38.2 C) 100.8 F (38.2 C)  TempSrc:      Resp: 21 29 27 27   Height:      Weight:      SpO2: 96% 97% 97% 97%   6 lpm np I/O last 3 completed shifts: In: 5130 [I.V.:4650; NG/GT:30; IV Piggyback:450] Out: 2061 [Urine:1900; Emesis/NG output:5; Stool:6; Blood:150]  Physical Examination: General:  Appears ill, pale, no acute distress Neuro:  Awake, alert, cooperative with exam HEENT:  NGT, dry membranes Neck:  Supple, no JVD   Cardiovascular:  RRR, no M/R/G Lungs:  Bilateral diminished air entry, resp's even/non-labored Abdomen:  Clean surgical dressing, viable colostomy, tenderness to palpation, absent bowel sounds Musculoskeletal:  Moves all extremities, no pedal edema Skin:  No  rash   Labs and Imaging:  Reviewed.  Please refer to the Assessment and Plan section for relevant results.      ASSESSMENT AND PLAN   PULMONARY A:  Transient hypoxia, likely postop atelectasis. P: -->  Supplemental oxygen -->  Incentive spirometry -->  Pain control as above -->  Follow up cxr in am   CARDIOVASCULAR A:  SIRS secondary to peritonitis.  Hypovolemia. P: -->  NS boluses -->  Goal MAP 60-65 -->  No indications for CVL / Vasopressors at this time  RENAL  Lab 10/05/11 0347 10/04/11 1855 10/04/11 1345  NA 138 138 136  K 2.8* 3.5 --  CL 104 103 94*  CO2 27 22 29   BUN 9 11 16   CREATININE 0.68 0.65 0.77  CALCIUM 7.4* 7.6* 9.3  MG 2.0 -- --  PHOS 2.3 -- --   A:  Normal renal function.  Hypokalemia -    -->  Replete K+ am 4/4, f/u bmp evening 4/4  GASTROINTESTINAL  Lab 10/04/11 1345  AST 34  ALT 24  ALKPHOS 75  BILITOT 0.4  PROT 7.1  ALBUMIN 4.3   A:  Perforated viscus s/p colectomy / colostomy. P: -->  Per CCS  HEMATOLOGIC  Lab 10/05/11 0347 10/04/11 1952 10/04/11 1855 10/04/11 1345  HGB 13.1 13.3 14.6 16.0*  HCT 39.8 40.4 44.9 47.8*  PLT 242 296 SPECIMEN CLOTTED 343  INR 1.22 -- -- --  APTT 35 -- -- --  A:  No active issues. P: -->  CBC in AM  INFECTIOUS  Lab 10/05/11 0347 10/05/11 0001 10/04/11 1952 10/04/11 1855 10/04/11 1345  WBC 3.7* -- 1.7* 1.5* 4.6  PROCALCITON -- 17.61 -- -- --   A:  Peritonitis.  Reactive leukopenia. P: -->  Follow PCT -->  Continue Ertapenem -->   d/c Vancomycin 4/4 -->  If signs of sepsis would add Micafungin  ENDOCRINE A:  No active issues. P: -->  No interventions required  BEST PRACTICE / DISPOSITION -->  ICU status under CCS -->  PCCM consulting -->  Full code -->  NPO -->  Heparin  for DVT Px -->  Protonix IV for GI Px -->  Family updated at bedside    Canary Brim, NP-C  Pulmonary & Critical Care Pgr: [phone]  Pt independently  seen and examined and available cxr's  reviewed and I agree with above findings/ imp/ plan  Sandrea Hughs, MD Pulmonary and Critical Care Medicine [facility] [phone]

## 2011-10-05 NOTE — Progress Notes (Signed)
eLink Physician-Brief Progress Note Patient Name: Stacy Dalton DOB: [birth year] MRN: [id number]  Date of Service  10/05/2011   HPI/Events of Note    Lab 10/05/11 0347 10/04/11 1855 10/04/11 1345  K 2.8* 3.5 2.8*     eICU Interventions  Iv kcl 10 x 4 runs Check mag and phos   Intervention Category Minor Interventions: Electrolytes abnormality - evaluation and management  Emelia Sandoval 10/05/2011, 6:08 AM

## 2011-10-06 ENCOUNTER — Inpatient Hospital Stay (HOSPITAL_COMMUNITY)

## 2011-10-06 ENCOUNTER — Encounter (HOSPITAL_COMMUNITY): Payer: Self-pay

## 2011-10-06 LAB — CBC
HCT: 34.2 % — ABNORMAL LOW (ref 36.0–46.0)
Hemoglobin: 11.1 g/dL — ABNORMAL LOW (ref 12.0–15.0)
MCH: 30 pg (ref 26.0–34.0)
MCHC: 32.5 g/dL (ref 30.0–36.0)
MCV: 92.4 fL (ref 78.0–100.0)
Platelets: 150 10*3/uL (ref 150–400)
RBC: 3.7 MIL/uL — ABNORMAL LOW (ref 3.87–5.11)
RDW: 12.9 % (ref 11.5–15.5)
WBC: 10.1 10*3/uL (ref 4.0–10.5)

## 2011-10-06 LAB — BASIC METABOLIC PANEL
BUN: 9 mg/dL (ref 6–23)
CO2: 25 mEq/L (ref 19–32)
Calcium: 8.1 mg/dL — ABNORMAL LOW (ref 8.4–10.5)
Chloride: 107 mEq/L (ref 96–112)
Creatinine, Ser: 0.58 mg/dL (ref 0.50–1.10)
GFR calc Af Amer: >90 mL/min (ref >90)
GFR calc non Af Amer: >90 mL/min (ref >90)
Glucose, Bld: 92 mg/dL (ref 70–99)
Potassium: 4.4 mEq/L (ref 3.5–5.1)
Sodium: 137 mEq/L (ref 135–145)

## 2011-10-06 LAB — MAGNESIUM: Magnesium: 2.2 mg/dL (ref 1.5–2.5)

## 2011-10-06 MED ORDER — DIPHENHYDRAMINE HCL 12.5 MG/5ML PO ELIX: 12.5000 mg | ORAL_SOLUTION | Freq: Four times a day (QID) | ORAL | Status: DC | PRN

## 2011-10-06 MED ORDER — DIPHENHYDRAMINE HCL 50 MG/ML IJ SOLN
12.5000 mg | Freq: Four times a day (QID) | INTRAMUSCULAR | Status: DC | PRN
  Filled 2011-10-06: qty 1

## 2011-10-06 MED ORDER — FENTANYL CITRATE 0.05 MG/ML IJ SOLN
50.0000 ug | INTRAMUSCULAR | Status: DC | PRN
  Administered 2011-10-06 (×2): via INTRAVENOUS
  Administered 2011-10-17: 50 ug via INTRAVENOUS
  Filled 2011-10-06 (×2): qty 2

## 2011-10-06 MED ORDER — ONDANSETRON HCL 4 MG/2ML IJ SOLN
4.0000 mg | Freq: Four times a day (QID) | INTRAMUSCULAR | Status: DC | PRN
  Administered 2011-10-09 – 2011-10-18 (×12): 4 mg via INTRAVENOUS
  Filled 2011-10-06 (×11): qty 2

## 2011-10-06 MED ORDER — ACETAMINOPHEN 10 MG/ML IV SOLN
1000.0000 mg | Freq: Four times a day (QID) | INTRAVENOUS | Status: AC
  Administered 2011-10-06 – 2011-10-07 (×3): 1000 mg via INTRAVENOUS
  Filled 2011-10-06 (×6): qty 100

## 2011-10-06 MED ORDER — FENTANYL 10 MCG/ML IV SOLN
INTRAVENOUS | Status: DC
  Administered 2011-10-06: 15 ug via INTRAVENOUS
  Administered 2011-10-06: 147.9 ug via INTRAVENOUS
  Administered 2011-10-07: 68.43 ug via INTRAVENOUS
  Administered 2011-10-07: 105 ug via INTRAVENOUS
  Administered 2011-10-07: 18:00:00 via INTRAVENOUS
  Administered 2011-10-07: 150 ug via INTRAVENOUS
  Administered 2011-10-07: 165 ug via INTRAVENOUS
  Administered 2011-10-07: 135 ug via INTRAVENOUS
  Administered 2011-10-08: 218 ug via INTRAVENOUS
  Administered 2011-10-08: 300 ug via INTRAVENOUS
  Administered 2011-10-08: 195 ug via INTRAVENOUS
  Administered 2011-10-08: 22:00:00 via INTRAVENOUS
  Administered 2011-10-08: 255 ug via INTRAVENOUS
  Administered 2011-10-08 – 2011-10-09 (×3): via INTRAVENOUS
  Administered 2011-10-09: 270 ug via INTRAVENOUS
  Administered 2011-10-09: 07:00:00 via INTRAVENOUS
  Administered 2011-10-09: 222 ug via INTRAVENOUS
  Administered 2011-10-09: 13:00:00 via INTRAVENOUS
  Administered 2011-10-09: 171 ug via INTRAVENOUS
  Administered 2011-10-10: 150 ug via INTRAVENOUS
  Administered 2011-10-10: 285 ug via INTRAVENOUS
  Administered 2011-10-10: 06:00:00 via INTRAVENOUS
  Administered 2011-10-10: 224.6 ug via INTRAVENOUS
  Administered 2011-10-10: 14:00:00 via INTRAVENOUS
  Administered 2011-10-10: 315 ug via INTRAVENOUS
  Administered 2011-10-10: 320 ug via INTRAVENOUS
  Administered 2011-10-10: 270 ug via INTRAVENOUS
  Administered 2011-10-10: 105 ug via INTRAVENOUS
  Administered 2011-10-10: 21:00:00 via INTRAVENOUS
  Administered 2011-10-11: 239.2 ug via INTRAVENOUS
  Administered 2011-10-11: 204.5 ug via INTRAVENOUS
  Administered 2011-10-11 (×2): via INTRAVENOUS
  Administered 2011-10-11: 300 ug via INTRAVENOUS
  Administered 2011-10-11: 20:00:00 via INTRAVENOUS
  Administered 2011-10-11: 264.2 ug via INTRAVENOUS
  Administered 2011-10-11: 260.8 ug via INTRAVENOUS
  Administered 2011-10-11: 300 ug via INTRAVENOUS
  Administered 2011-10-12: 13:00:00 via INTRAVENOUS
  Administered 2011-10-12: 225 ug via INTRAVENOUS
  Administered 2011-10-12: 21:00:00 via INTRAVENOUS
  Administered 2011-10-12: 500 ug via INTRAVENOUS
  Administered 2011-10-12: 192.9 ug via INTRAVENOUS
  Administered 2011-10-12: 135 ug via INTRAVENOUS
  Administered 2011-10-12: 235 ug via INTRAVENOUS
  Administered 2011-10-12: 315 ug via INTRAVENOUS
  Administered 2011-10-13: 105 ug via INTRAVENOUS
  Administered 2011-10-13: 255 ug via INTRAVENOUS
  Administered 2011-10-13: 135 ug via INTRAVENOUS
  Administered 2011-10-13: 255 ug via INTRAVENOUS
  Administered 2011-10-13 (×2): via INTRAVENOUS
  Administered 2011-10-13: 285 ug via INTRAVENOUS
  Administered 2011-10-14: 15 ug/h via INTRAVENOUS
  Administered 2011-10-14: 04:00:00 via INTRAVENOUS
  Administered 2011-10-14: 105 ug via INTRAVENOUS
  Administered 2011-10-14: 150 ug via INTRAVENOUS
  Administered 2011-10-14: 135 ug via INTRAVENOUS
  Administered 2011-10-14: 225 ug via INTRAVENOUS
  Administered 2011-10-14: 300 ug via INTRAVENOUS
  Administered 2011-10-14: 205 ug via INTRAVENOUS
  Administered 2011-10-15: 270 ug via INTRAVENOUS
  Administered 2011-10-15: 105 ug via INTRAVENOUS
  Administered 2011-10-15 (×2): via INTRAVENOUS
  Administered 2011-10-15: 274 ug via INTRAVENOUS
  Administered 2011-10-15: 23:00:00 via INTRAVENOUS
  Administered 2011-10-15: 6.98 ug via INTRAVENOUS
  Administered 2011-10-15: 105 ug via INTRAVENOUS
  Administered 2011-10-15: 294 ug via INTRAVENOUS
  Administered 2011-10-15: 120 ug via INTRAVENOUS
  Administered 2011-10-16: 10:00:00 via INTRAVENOUS
  Administered 2011-10-16: 12710 ug via INTRAVENOUS
  Administered 2011-10-16: 18:00:00 via INTRAVENOUS
  Administered 2011-10-16: 103.7 ug via INTRAVENOUS
  Administered 2011-10-17: 14:00:00 via INTRAVENOUS
  Administered 2011-10-17: 70 ug via INTRAVENOUS
  Administered 2011-10-17: 750 ug via INTRAVENOUS
  Administered 2011-10-17: 05:00:00 via INTRAVENOUS
  Administered 2011-10-17: 231.9 ug via INTRAVENOUS
  Administered 2011-10-18: 135 ug via INTRAVENOUS
  Administered 2011-10-18: 11:00:00 via INTRAVENOUS
  Administered 2011-10-18: 195 ug via INTRAVENOUS
  Administered 2011-10-18: 01:00:00 via INTRAVENOUS
  Filled 2011-10-06 (×33): qty 50

## 2011-10-06 MED ORDER — NALOXONE HCL 0.4 MG/ML IJ SOLN: 0.4000 mg | INTRAMUSCULAR | Status: DC | PRN

## 2011-10-06 MED ORDER — SODIUM CHLORIDE 0.9 % IJ SOLN
9.0000 mL | INTRAMUSCULAR | Status: DC | PRN
  Administered 2011-10-17: 22:00:00 via INTRAVENOUS

## 2011-10-06 NOTE — Progress Notes (Addendum)
2 Days Post-Op  Subjective: Patient is alert, mild to moderate distress from abdominal pain. Finds it difficult to get a deep breath. Vital signs stable. Fevers arr declining.  Potassium 4.4 following KCl runs. WBC up to 10.1. Hemoglobin 11.1.     Objective: Vital signs in last 24 hours: Temp:  [100 F (37.8 C)-101.1 F (38.4 C)] 100 F (37.8 C) (04/05 0400) Pulse Rate:  [108-117] 113  (04/05 0400) Resp:  [19-29] 20  (04/05 0400) BP: (95-122)/(52-66) 118/62 mmHg (04/05 0300) SpO2:  [93 %-97 %] 95 % (04/05 0400)    Intake/Output from previous day: 04/04 0701 - 04/05 0700 In: 3810 [I.V.:2750; IV Piggyback:1060] Out: 1935 [Urine:1935] Intake/Output this shift: Total I/O In: 1185 [I.V.:1125; IV Piggyback:60] Out: 1155 [Urine:1155]  General appearance: awake and alert. Mentation normal. Moderate distress. Anxious. Resp: no wheeze or rhonchi. Diminished breath sounds at bases. GI: diffusely tender but appropriately tender. Soft. Midline wound intact and clean. Colostomy pink and healthy, no output. Silent.  Lab Results:  Results for orders placed during the hospital encounter of 10/04/11 (from the past 24 hour(s))  BASIC METABOLIC PANEL     Status: Abnormal   Collection Time   10/05/11  4:41 PM      Component Value Range   Sodium 135  135 - 145 (mEq/L)   Potassium 4.5  3.5 - 5.1 (mEq/L)   Chloride 105  96 - 112 (mEq/L)   CO2 25  19 - 32 (mEq/L)   Glucose, Bld 94  70 - 99 (mg/dL)   BUN 9  6 - 23 (mg/dL)   Creatinine, Ser 7.82  0.50 - 1.10 (mg/dL)   Calcium 7.7 (*) 8.4 - 10.5 (mg/dL)   GFR calc non Af Amer >90  >90 (mL/min)   GFR calc Af Amer >90  >90 (mL/min)  CBC     Status: Abnormal   Collection Time   10/06/11  3:40 AM      Component Value Range   WBC 10.1  4.0 - 10.5 (K/uL)   RBC 3.70 (*) 3.87 - 5.11 (MIL/uL)   Hemoglobin 11.1 (*) 12.0 - 15.0 (g/dL)   HCT 95.6 (*) 21.3 - 46.0 (%)   MCV 92.4  78.0 - 100.0 (fL)   MCH 30.0  26.0 - 34.0 (pg)   MCHC 32.5  30.0 - 36.0  (g/dL)   RDW 08.6  57.8 - 46.9 (%)   Platelets 150  150 - 400 (K/uL)  BASIC METABOLIC PANEL     Status: Abnormal   Collection Time   10/06/11  3:40 AM      Component Value Range   Sodium 137  135 - 145 (mEq/L)   Potassium 4.4  3.5 - 5.1 (mEq/L)   Chloride 107  96 - 112 (mEq/L)   CO2 25  19 - 32 (mEq/L)   Glucose, Bld 92  70 - 99 (mg/dL)   BUN 9  6 - 23 (mg/dL)   Creatinine, Ser 6.29  0.50 - 1.10 (mg/dL)   Calcium 8.1 (*) 8.4 - 10.5 (mg/dL)   GFR calc non Af Amer >90  >90 (mL/min)   GFR calc Af Amer >90  >90 (mL/min)  MAGNESIUM     Status: Normal   Collection Time   10/06/11  3:40 AM      Component Value Range   Magnesium 2.2  1.5 - 2.5 (mg/dL)     Studies/Results: @RISRSLT24 @     . ertapenem (INVANZ) IV  1 g Intravenous Q24H  . heparin  5,000 Units Subcutaneous Q8H  . HYDROmorphone      . HYDROmorphone      . pantoprazole (PROTONIX) IV  40 mg Intravenous Q24H  . potassium chloride  10 mEq Intravenous Q1 Hr x 6  . sodium chloride  500 mL Intravenous Once  . DISCONTD: potassium chloride  10 mEq Intravenous Q1 Hr x 4  . DISCONTD: vancomycin  750 mg Intravenous Q8H     Assessment/Plan: s/p Procedure(s): EXPLORATORY LAPAROTOMY, Left colectomy with colostomy  POD #2. Reasonably stable. Deconditioned and slow to move.  Some subjective dyspnea. Will check chest x-ray. Continue ICU care. Continue Foley and NG tube. Anticipate ileus for several days. Continue mobilize out of bed. Incentive spirometry. Twice a day wound care ordered. Check lab work tomorrow.  High-Risk for septic complications due to extend the fecal contamination and peritonitis.      LOS: 2 days    Macky Galik M. Derrell Lolling, M.D., [facility] General and Minimally invasive Surgery Breast and Colorectal Surgery Office:   [phone] Pager:   [phone]  10/06/2011  . .prob

## 2011-10-06 NOTE — Progress Notes (Addendum)
Name: Stacy Dalton MRN: [id number] DOB: [birth year]    LOS: 2  PCCM PROGRESS NOTE  Brief patient profile:  61 yowf former smoker [facility] ED on 4/3 with acute onset abdominal pain.  She was febrile and tachycardic. Imaging demonstrated large amount of intraperitoneal air.  Antibiotics were given.  She was taken to OR for emergency exploratory laparotomy which demonstrated perforated colon and diffuse peritonitis.  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  She was extubated in OR and brought to ICU for further management.  In ICU she was transiently hypotensive after being medicated with Dialudid but responded to fluids.     Lines / Drains:    Cultures: None  Antibiotics: 4/3  Zosyn x 1 4/3  Vancomycin > 4/4 4/3  Ertapenem (peritonitis) >>>  Tests / Events: 4/3  Abdomen CT >>>  Free intraperitoneal air, fluid in peritoneal cavity, prominent bile duct 4/4  Elploratory laparotomy, colectomy, colostomy    Subjective:   Patient c/o's abd pain, denies SOB, chest pain, n/v.    Vital Signs: Filed Vitals:   10/06/11 0100 10/06/11 0300 10/06/11 0400 10/06/11 0700  BP: 118/61 118/62  115/60  Pulse: 114 112 113 115  Temp: 100 F (37.8 C) 100 F (37.8 C) 100 F (37.8 C) 100.2 F (37.9 C)  TempSrc:      Resp: 19 19 20 19   Height:      Weight:      SpO2: 95% 96% 95% 92%    02  @ 5 lpm np    Intake/Output Summary (Last 24 hours) at 10/06/11 1425 Last data filed at 10/06/11 1108  Gross per 24 hour  Intake   2685 ml  Output   2600 ml  Net     85 ml    Physical Examination: General:  Appears ill, pale, no acute distress Neuro:  Awake, alert, cooperative with exam HEENT:  NGT, dry membranes Neck:  Supple, no JVD   Cardiovascular:  RRR, no M/R/G Lungs:  Bilateral diminished air entry, resp's even/non-labored Abdomen:  Clean surgical dressing, viable colostomy, tenderness to palpation, absent bowel sounds Musculoskeletal:  Moves all extremities, no pedal edema Skin:  No  rash   Labs and Imaging:  Reviewed.  Please refer to the Assessment and Plan section for relevant results.   cxr 4/5 Interval development of asymmetric air space disease which may  represent pulmonary edema with bilateral pleural effusions and  basilar atelectasis. Basilar infiltrate not excluded in the proper  clinical setting.    ASSESSMENT AND PLAN   PULMONARY A:  Transient hypoxia, likely postop atelectasis. P: -->  Supplemental oxygen -->  Incentive spirometry -->  Pain control  -->  Follow up cxr in am  > try diuresis / keep on dry side with high risk ALI from peritonitis.  CARDIOVASCULAR A:  SIRS secondary to peritonitis.  Hypovolemia. -Resolved.  P: -->  Goal MAP 60-65 -->  No indications for CVL / Vasopressors at this time  RENAL  Lab 10/06/11 0340 10/05/11 1641 10/05/11 0347 10/04/11 1855 10/04/11 1345  NA 137 135 138 138 136  K 4.4 4.5 -- -- --  CL 107 105 104 103 94*  CO2 25 25 27 22 29   BUN 9 9 9 11 16   CREATININE 0.58 0.58 0.68 0.65 0.77  CALCIUM 8.1* 7.7* 7.4* 7.6* 9.3  MG 2.2 -- 2.0 -- --  PHOS -- -- 2.3 -- --   A:  Normal renal function.  Hypokalemia -    -->  Follow up BMP  GASTROINTESTINAL  Lab 10/04/11 1345  AST 34  ALT 24  ALKPHOS 75  BILITOT 0.4  PROT 7.1  ALBUMIN 4.3   A:  Perforated viscus s/p colectomy / colostomy. P: -->  Per CCS  HEMATOLOGIC  Lab 10/06/11 0340 10/05/11 0347 10/04/11 1952 10/04/11 1855 10/04/11 1345  HGB 11.1* 13.1 13.3 14.6 16.0*  HCT 34.2* 39.8 40.4 44.9 47.8*  PLT 150 242 296 SPECIMEN CLOTTED 343  INR -- 1.22 -- -- --  APTT -- 35 -- -- --   A:  No active issues. P: -->  CBC in AM  INFECTIOUS  Lab 10/06/11 0340 10/05/11 0347 10/05/11 0001 10/04/11 1952 10/04/11 1855 10/04/11 1345  WBC 10.1 3.7* -- 1.7* 1.5* 4.6  PROCALCITON -- -- 17.61 -- -- --   A:  Peritonitis.  Reactive leukopenia. P: -->  Follow PCT -->  Continue Ertapenem -->  If signs of sepsis would add Micafungin  ENDOCRINE A:   No active issues. P: -->  No interventions required  BEST PRACTICE / DISPOSITION -->  ICU status under CCS -->  PCCM consulting -->  Full code -->  NPO -->  Heparin [place] for DVT Px -->  Protonix IV for GI Px -->  Family updated at bedside    Canary Brim, NP-C [facility] Pgr: [phone]

## 2011-10-06 NOTE — Progress Notes (Signed)
Agree 

## 2011-10-06 NOTE — Consult Note (Signed)
WOC ostomy consult  Stoma type/location: LLQ COlostomy Stomal assessment/size: (Mostly) round, budded, pink, viable stoma with os at center Peristomal assessment: intact, clear Treatment options for stomal/peristomal skin: none indicated Output scant bloody at this time Ostomy pouching: 1pc. Cut-to-fit pouch applied today [place] [phone])  Education provided: Patient and friend/family member taught that ostomy nurse was resource in post op period.  Patient sleepy, nods understanding.  Booklet left at bedside.  Our team will follow along with you. Thanks, Ladona Mow, MSN, RN, [facility], CWOCN [phone])

## 2011-10-06 NOTE — Progress Notes (Signed)
Complaining of continuous pain.  I uped her fentanyl and added IV tylenol, but she's asking for something every time nurse goes by.  Will try fentanyl PCA, continue IV tylenol.

## 2011-10-07 ENCOUNTER — Other Ambulatory Visit: Payer: Self-pay

## 2011-10-07 DIAGNOSIS — J810 Acute pulmonary edema: Secondary | ICD-10-CM

## 2011-10-07 LAB — BASIC METABOLIC PANEL
BUN: 6 mg/dL (ref 6–23)
CO2: 24 mEq/L (ref 19–32)
Calcium: 8.5 mg/dL (ref 8.4–10.5)
Chloride: 104 mEq/L (ref 96–112)
Creatinine, Ser: 0.52 mg/dL (ref 0.50–1.10)
GFR calc Af Amer: >90 mL/min (ref >90)
GFR calc non Af Amer: >90 mL/min (ref >90)
Glucose, Bld: 76 mg/dL (ref 70–99)
Potassium: 3.5 mEq/L (ref 3.5–5.1)
Sodium: 137 mEq/L (ref 135–145)

## 2011-10-07 LAB — GLUCOSE, CAPILLARY
Glucose-Capillary: 114 mg/dL — ABNORMAL HIGH (ref 70–99)
Glucose-Capillary: 161 mg/dL — ABNORMAL HIGH (ref 70–99)
Glucose-Capillary: 57 mg/dL — ABNORMAL LOW (ref 70–99)

## 2011-10-07 LAB — CBC
HCT: 33 % — ABNORMAL LOW (ref 36.0–46.0)
Hemoglobin: 10.8 g/dL — ABNORMAL LOW (ref 12.0–15.0)
MCH: 29.7 pg (ref 26.0–34.0)
MCHC: 32.7 g/dL (ref 30.0–36.0)
MCV: 90.7 fL (ref 78.0–100.0)
Platelets: 190 10*3/uL (ref 150–400)
RBC: 3.64 MIL/uL — ABNORMAL LOW (ref 3.87–5.11)
RDW: 12.9 % (ref 11.5–15.5)
WBC: 12.7 10*3/uL — ABNORMAL HIGH (ref 4.0–10.5)

## 2011-10-07 LAB — MAGNESIUM: Magnesium: 1.7 mg/dL (ref 1.5–2.5)

## 2011-10-07 MED ORDER — DEXTROSE 50 % IV SOLN
INTRAVENOUS | Status: AC
  Administered 2011-10-07: 50 mL
  Filled 2011-10-07: qty 50

## 2011-10-07 MED ORDER — POTASSIUM CHLORIDE 2 MEQ/ML IV SOLN
INTRAVENOUS | Status: AC
  Administered 2011-10-07 – 2011-10-14 (×12): via INTRAVENOUS
  Filled 2011-10-07 (×15): qty 1000

## 2011-10-07 MED ORDER — SIMETHICONE 80 MG PO CHEW
80.0000 mg | CHEWABLE_TABLET | Freq: Four times a day (QID) | ORAL | Status: DC | PRN
  Administered 2011-10-07 – 2011-10-19 (×9): 80 mg via ORAL
  Filled 2011-10-07 (×7): qty 1

## 2011-10-07 MED ORDER — FUROSEMIDE 10 MG/ML IJ SOLN
40.0000 mg | Freq: Once | INTRAMUSCULAR | Status: AC
  Administered 2011-10-07: 40 mg via INTRAVENOUS
  Filled 2011-10-07: qty 4

## 2011-10-07 MED ORDER — POTASSIUM CHLORIDE 10 MEQ/100ML IV SOLN
INTRAVENOUS | Status: AC
  Administered 2011-10-07: 10 meq via INTRAVENOUS
  Filled 2011-10-07: qty 100

## 2011-10-07 MED ORDER — POTASSIUM CHLORIDE 10 MEQ/100ML IV SOLN
10.0000 meq | INTRAVENOUS | Status: AC
  Administered 2011-10-07 (×5): 10 meq via INTRAVENOUS
  Filled 2011-10-07 (×3): qty 100

## 2011-10-07 NOTE — Progress Notes (Signed)
Name: Stacy Dalton MRN: [id number] DOB: 06/11/1951    LOS: 3  PCCM PROGRESS NOTE  Brief patient profile:  61 yowf former smoker [facility] ED on 4/3 with acute onset abdominal pain.  She was febrile and tachycardic. Imaging demonstrated large amount of intraperitoneal air.  Antibiotics were given.  She was taken to OR for emergency exploratory laparotomy which demonstrated perforated colon and diffuse peritonitis.  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  She was extubated in OR and brought to ICU for further management.  In ICU she was transiently hypotensive after being medicated with Dialudid but responded to fluids.     Lines / Drains:    Cultures: Recent Results (from the past 240 hour(s))  MRSA PCR SCREENING     Status: Normal   Collection Time   10/04/11  8:48 PM      Component Value Range Status Comment   MRSA by PCR NEGATIVE  NEGATIVE  Final      Antibiotics: 4/3  Zosyn x 1 4/3  Vancomycin > 4/4 4/3  Ertapenem (peritonitis) >>>  Anti-infectives     Start     Dose/Rate Route Frequency Ordered Stop   10/04/11 2359   piperacillin-tazobactam (ZOSYN) IVPB 3.375 g  Status:  Discontinued        3.375 g 12.5 mL/hr over 240 Minutes Intravenous 3 times per day 10/04/11 1607 10/04/11 2040   10/04/11 2200   ertapenem (INVANZ) 1 g in sodium chloride 0.9 % 50 mL IVPB        1 g 100 mL/hr over 30 Minutes Intravenous Every 24 hours 10/04/11 2040     10/04/11 2000   vancomycin (VANCOCIN) 750 mg in sodium chloride 0.9 % 150 mL IVPB  Status:  Discontinued        750 mg 150 mL/hr over 60 Minutes Intravenous Every 8 hours 10/04/11 1921 10/05/11 1523   10/04/11 1600  piperacillin-tazobactam (ZOSYN) IVPB 3.375 g       3.375 g 12.5 mL/hr over 240 Minutes Intravenous  Once 10/04/11 1528 10/04/11 1958           Tests / Events: 4/3  Abdomen CT >>>  Free intraperitoneal air, fluid in peritoneal cavity, prominent bile duct 4/4  Elploratory laparotomy, colectomy,  colostomy    Subjective:   Sleeping. No distress. No overnight issues perRN.  CCS notes: pain main issue  Vital Signs: Filed Vitals:   10/07/11 0000 10/07/11 0400 10/07/11 0444 10/07/11 0856  BP: 144/79 129/73    Pulse: 118 100    Temp: 100.4 F (38 C) 99.1 F (37.3 C)    TempSrc: Oral Oral    Resp: 18 21 22 21   Height:      Weight:      SpO2: 96% 96% 94% 98%    02  @ 5 lpm np    Intake/Output Summary (Last 24 hours) at 10/07/11 1207 Last data filed at 10/07/11 1191  Gross per 24 hour  Intake 683.63 ml  Output   3325 ml  Net -2641.37 ml    Physical Examination: General:  Appears ill, , no acute distress Neuro:  Sleeping HEENT:  NGT, dry membranes Neck:  Supple, no JVD   Cardiovascular:  RRR, no M/R/G Lungs:  Bilateral diminished air entry, resp's even/non-labored Abdomen:  Clean surgical dressing, viable colostomy, tenderness to palpation, absent bowel sounds Musculoskeletal:  Moves all extremities, no pedal edema Skin:  No rash   Labs and Imaging:  Reviewed.  Please refer to the Assessment  and Plan section for relevant results.  Dg Chest Port 1 View  10/06/2011  *RADIOLOGY REPORT*  Clinical Data: Question infiltrate.  High blood pressure. Perforated bowel.  PORTABLE CHEST - 1 VIEW  Comparison: 10/04/2011.  Findings: Nasogastric tube courses below the diaphragm.  The tip is not included on this exam.  Asymmetric air space disease greater on the right with pleural effusions greater on the right.  Basilar atelectasis or infiltrate. Cardiomegaly.  Central pulmonary vascular prominence.  Given the nodularity of the hilar region, underlying mass or adenopathy is not excluded.  IMPRESSION: Interval development of asymmetric air space disease which may represent pulmonary edema with bilateral pleural effusions and basilar atelectasis.  Basilar infiltrate not excluded in the proper clinical setting.  Nodularity hilar region.  Underlying mass or adenopathy not excluded.   Attention to this on follow-up.  Original Report Authenticated By: Fuller Canada, M.D.    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 P: -->  Supplemental oxygen -->  Incentive spirometry -->  Pain control  -->  Follow up cxr in am  > try diuresis CCS directing / keep on dry side with high risk ALI from peritonitis.  CARDIOVASCULAR A:  SIRS secondary to peritonitis.  Hypovolemia. -Resolved.  P: -->  Goal MAP 60-65 -->  No indications for CVL / Vasopressors at this time  RENAL  Lab 10/07/11 0347 10/06/11 0340 10/05/11 1641 10/05/11 0347 10/04/11 1855  NA 137 137 135 138 138  K 3.5 4.4 -- -- --  CL 104 107 105 104 103  CO2 24 25 25 27 22   BUN 6 9 9 9 11   CREATININE 0.52 0.58 0.58 0.68 0.65  CALCIUM 8.5 8.1* 7.7* 7.4* 7.6*  MG 1.7 2.2 -- 2.0 --  PHOS -- -- -- 2.3 --   A:  Normal renal function.  Hypokalemia -    -->  Follow up BMP  GASTROINTESTINAL  Lab 10/04/11 1345  AST 34  ALT 24  ALKPHOS 75  BILITOT 0.4  PROT 7.1  ALBUMIN 4.3   A:  Perforated viscus s/p colectomy / colostomy. P: -->  Per CCS  HEMATOLOGIC  Lab 10/07/11 0347 10/06/11 0340 10/05/11 0347 10/04/11 1952 10/04/11 1855  HGB 10.8* 11.1* 13.1 13.3 14.6  HCT 33.0* 34.2* 39.8 40.4 44.9  PLT 190 150 242 296 SPECIMEN CLOTTED  INR -- -- 1.22 -- --  APTT -- -- 35 -- --   A:  No active issues. P: -->  CBC in AM  INFECTIOUS  Lab 10/07/11 0347 10/06/11 0340 10/05/11 0347 10/05/11 0001 10/04/11 1952 10/04/11 1855  WBC 12.7* 10.1 3.7* -- 1.7* 1.5*  PROCALCITON -- -- -- 17.61 -- --   A:  Peritonitis.  Reactive leukopenia. P: -->  Follow PCT -->  Continue Ertapenem -->  If signs of sepsis would add Micafungin  ENDOCRINE A:  No active issues. P: -->  No  interventions required  BEST PRACTICE / DISPOSITION -->  ICU status under CCS -->  PCCM consulting -->  Full code -->  NPO -->  Heparin [place] for DVT Px -->  Protonix IV for GI Px   Dr. Kalman Shan, M.D., [facility].C.P Pulmonary  and Critical Care Medicine Staff Physician [facility] [place] Pulmonary and Critical Care Pager: [phone], If no answer or between  15:00h - 7:00h: call [phone]  10/07/2011 12:10 PM

## 2011-10-07 NOTE — Progress Notes (Signed)
Patient complaining of chest pain  6/10 that started around 5 minutes VS stable oxygen at 5L via [place]. Pain unable to describe type pain sates it just hurts. Denies it feels like pressure. EKG done and placed on chart. Patient thinks pain is related to potassium so rate decreased to 75 ml/hr. Dr. Daphine Deutscher notified. Will continue to monitor and notify if any changes.

## 2011-10-07 NOTE — Progress Notes (Addendum)
3 Days Post-Op  Subjective: Awake. Alert and. Mental status normal. Still a mild to moderate distress but she says her pain control is better than it was yesterday. Still a little bit dyspneic. No cough. O2 sats 94%.  Chest x-ray yesterday showed some haziness and interstitial edema, right greater than left. These were cut back at that time.  No output from colostomy.NG output low.  Objective: Vital signs in last 24 hours: Temp:  [99.1 F (37.3 C)-100.6 F (38.1 C)] 99.1 F (37.3 C) (04/06 0400) Pulse Rate:  [100-118] 100  (04/06 0400) Resp:  [17-27] 22  (04/06 0444) BP: (115-144)/(58-80) 129/73 mmHg (04/06 0400) SpO2:  [92 %-96 %] 94 % (04/06 0444)    Intake/Output from previous day: 04/05 0701 - 04/06 0700 In: 1283.1 [I.V.:1233.1; IV Piggyback:50] Out: 1675 [Urine:1675] Intake/Output this shift: Total I/O In: 268.1 [I.V.:218.1; IV Piggyback:50] Out: 925 [Urine:925]  General appearance: alert.   Appropriate. Mild to moderate distress and anxiety. No increased work of breathing. Resp: lungs are clear anteriorly, diminished breath sounds at bases but no rales or rhonchi. GI: soft. Appropriately tender. Midline wound clean, packed open. Colostomy pink and healthy. No stool in bag. No air in bag.  Lab Results:  Results for orders placed during the hospital encounter of 10/04/11 (from the past 24 hour(s))  CBC     Status: Abnormal   Collection Time   10/07/11  3:47 AM      Component Value Range   WBC 12.7 (*) 4.0 - 10.5 (K/uL)   RBC 3.64 (*) 3.87 - 5.11 (MIL/uL)   Hemoglobin 10.8 (*) 12.0 - 15.0 (g/dL)   HCT 16.1 (*) 09.6 - 46.0 (%)   MCV 90.7  78.0 - 100.0 (fL)   MCH 29.7  26.0 - 34.0 (pg)   MCHC 32.7  30.0 - 36.0 (g/dL)   RDW 04.5  40.9 - 81.1 (%)   Platelets 190  150 - 400 (K/uL)  BASIC METABOLIC PANEL     Status: Normal   Collection Time   10/07/11  3:47 AM      Component Value Range   Sodium 137  135 - 145 (mEq/L)   Potassium 3.5  3.5 - 5.1 (mEq/L)   Chloride 104   96 - 112 (mEq/L)   CO2 24  19 - 32 (mEq/L)   Glucose, Bld 76  70 - 99 (mg/dL)   BUN 6  6 - 23 (mg/dL)   Creatinine, Ser 9.14  0.50 - 1.10 (mg/dL)   Calcium 8.5  8.4 - 78.2 (mg/dL)   GFR calc non Af Amer >90  >90 (mL/min)   GFR calc Af Amer >90  >90 (mL/min)  MAGNESIUM     Status: Normal   Collection Time   10/07/11  3:47 AM      Component Value Range   Magnesium 1.7  1.5 - 2.5 (mg/dL)     Studies/Results: @RISRSLT24 @     . acetaminophen  1,000 mg Intravenous Q6H  . ertapenem (INVANZ) IV  1 g Intravenous Q24H  . fentaNYL   Intravenous Q4H  . furosemide  40 mg Intravenous Once  . heparin  5,000 Units Subcutaneous Q8H  . pantoprazole (PROTONIX) IV  40 mg Intravenous Q24H     Assessment/Plan: s/p Procedure(s): EXPLORATORY LAPAROTOMY  POD #3. Reasonably stable. Deconditioned and still has not been out of bed to chair yet.  Pulmonaruy edema and fluid overload. Will keep IVs at 50 cc per hour and give single dose of Lasix 40 mg  IV now.  Hypokalemia, will replace with 5 runs of KCl  because of anticipated depletion with Lasix.  Check bmet tomorrow  Continue ICU care. Continue Foley. Discontinue NG tube and continue n.p.o. Hopefully this will help her respiratory care.  High risk for septic complications due to the extent of the fecal contamination and peritonitis.  LOS: 3 days    Cassian Torelli M. Derrell Lolling, M.D., [facility] General and Minimally invasive Surgery Breast and Colorectal Surgery Office:   [phone] Pager:   [phone]  10/07/2011  . .prob

## 2011-10-08 ENCOUNTER — Inpatient Hospital Stay (HOSPITAL_COMMUNITY)

## 2011-10-08 DIAGNOSIS — J9819 Other pulmonary collapse: Secondary | ICD-10-CM

## 2011-10-08 DIAGNOSIS — J9600 Acute respiratory failure, unspecified whether with hypoxia or hypercapnia: Secondary | ICD-10-CM

## 2011-10-08 LAB — CBC
HCT: 35 % — ABNORMAL LOW (ref 36.0–46.0)
Hemoglobin: 11.8 g/dL — ABNORMAL LOW (ref 12.0–15.0)
MCH: 29.9 pg (ref 26.0–34.0)
MCHC: 33.7 g/dL (ref 30.0–36.0)
MCV: 88.8 fL (ref 78.0–100.0)
Platelets: 212 10*3/uL (ref 150–400)
RBC: 3.94 MIL/uL (ref 3.87–5.11)
RDW: 13.1 % (ref 11.5–15.5)
WBC: 15 10*3/uL — ABNORMAL HIGH (ref 4.0–10.5)

## 2011-10-08 LAB — BASIC METABOLIC PANEL
BUN: 8 mg/dL (ref 6–23)
BUN: 7 mg/dL (ref 6–23)
CO2: 23 mEq/L (ref 19–32)
CO2: 24 mEq/L (ref 19–32)
Calcium: 8.4 mg/dL (ref 8.4–10.5)
Calcium: 8.3 mg/dL — ABNORMAL LOW (ref 8.4–10.5)
Chloride: 102 mEq/L (ref 96–112)
Chloride: 104 mEq/L (ref 96–112)
Creatinine, Ser: 0.5 mg/dL (ref 0.50–1.10)
Creatinine, Ser: 0.39 mg/dL — ABNORMAL LOW (ref 0.50–1.10)
GFR calc Af Amer: >90 mL/min (ref >90)
GFR calc Af Amer: >90 mL/min (ref >90)
GFR calc non Af Amer: >90 mL/min (ref >90)
GFR calc non Af Amer: >90 mL/min (ref >90)
Glucose, Bld: 110 mg/dL — ABNORMAL HIGH (ref 70–99)
Glucose, Bld: 127 mg/dL — ABNORMAL HIGH (ref 70–99)
Potassium: 3.1 mEq/L — ABNORMAL LOW (ref 3.5–5.1)
Potassium: 3.5 mEq/L (ref 3.5–5.1)
Sodium: 137 mEq/L (ref 135–145)
Sodium: 135 mEq/L (ref 135–145)

## 2011-10-08 LAB — PROCALCITONIN: Procalcitonin: 1.49 ng/mL

## 2011-10-08 LAB — GLUCOSE, CAPILLARY
Glucose-Capillary: 106 mg/dL — ABNORMAL HIGH (ref 70–99)
Glucose-Capillary: 109 mg/dL — ABNORMAL HIGH (ref 70–99)
Glucose-Capillary: 117 mg/dL — ABNORMAL HIGH (ref 70–99)
Glucose-Capillary: 120 mg/dL — ABNORMAL HIGH (ref 70–99)
Glucose-Capillary: 129 mg/dL — ABNORMAL HIGH (ref 70–99)
Glucose-Capillary: 130 mg/dL — ABNORMAL HIGH (ref 70–99)

## 2011-10-08 LAB — PRO B NATRIURETIC PEPTIDE: Pro B Natriuretic peptide (BNP): 1366 pg/mL — ABNORMAL HIGH (ref 0–125)

## 2011-10-08 LAB — PHOSPHORUS: Phosphorus: 1.1 mg/dL — ABNORMAL LOW (ref 2.3–4.6)

## 2011-10-08 LAB — MAGNESIUM: Magnesium: 1.9 mg/dL (ref 1.5–2.5)

## 2011-10-08 LAB — LACTIC ACID, PLASMA: Lactic Acid, Venous: 1 mmol/L (ref 0.5–2.2)

## 2011-10-08 MED ORDER — VITAMINS A & D EX OINT
TOPICAL_OINTMENT | CUTANEOUS | Status: AC
  Filled 2011-10-08: qty 5

## 2011-10-08 MED ORDER — POTASSIUM CHLORIDE 10 MEQ/100ML IV SOLN
10.0000 meq | INTRAVENOUS | Status: AC
  Administered 2011-10-08 (×4): 10 meq via INTRAVENOUS
  Filled 2011-10-08: qty 300
  Filled 2011-10-08: qty 100

## 2011-10-08 MED ORDER — MAGNESIUM SULFATE IN D5W 10-5 MG/ML-% IV SOLN
1.0000 g | Freq: Once | INTRAVENOUS | Status: AC
  Administered 2011-10-08: 1 g via INTRAVENOUS
  Filled 2011-10-08: qty 100

## 2011-10-08 MED ORDER — MAGNESIUM SULFATE 50 % IJ SOLN: 1.0000 g | Freq: Once | INTRAVENOUS | Status: DC

## 2011-10-08 NOTE — Progress Notes (Signed)
eLink Physician-Brief Progress Note Patient Name: Stacy Dalton DOB: Aug 17, 1950 MRN: [id number]  Date of Service  10/08/2011   HPI/Events of Note   Low k  eICU Interventions  repleted      Catha Brow 10/08/2011, 4:57 AM

## 2011-10-08 NOTE — Progress Notes (Signed)
4 Days Post-Op  Subjective: Alert. Complains of intermittent gas cramps. No nausea or vomiting. Passing flatus per ostomy but no stool.getting up to chair. Angulation room with walker. Not very strong.diuresed following Lasix yesterday. She says she is breathing easier.  Has been febrile to 101, unremitting. Heart rate 105.   Objective: Vital signs in last 24 hours: Temp:  [99 F (37.2 C)-101.7 F (38.7 C)] 100.6 F (38.1 C) (04/07 0400) Pulse Rate:  [94-110] 98  (04/07 0400) Resp:  [16-26] 25  (04/07 0400) BP: (123-140)/(66-79) 135/79 mmHg (04/07 0400) SpO2:  [95 %-98 %] 97 % (04/07 0400) Weight:  [149 lb 14.6 oz (68 kg)] 149 lb 14.6 oz (68 kg) (04/07 0500)    Intake/Output from previous day: 04/06 0701 - 04/07 0700 In: 973.3 [I.V.:473.3; IV Piggyback:500] Out: 4125 [Urine:4125] Intake/Output this shift: Total I/O In: 50 [I.V.:50] Out: 1400 [Urine:1400]  General appearance: alert. Mental status normal. Mild distress and anxiety. Not agitated. Resp: lungs are clear to auscultation bilaterally, although decreased breath sounds at bases. . Vital capacity 700 cc. GI: abdomen is borderline distended. Reasonably soft with appropriate incisional tenderness. Colostomy pink with air in the bag.  Lab Results:  Results for orders placed during the hospital encounter of 10/04/11 (from the past 24 hour(s))  GLUCOSE, CAPILLARY     Status: Abnormal   Collection Time   10/07/11  9:19 PM      Component Value Range   Glucose-Capillary 57 (*) 70 - 99 (mg/dL)   Comment 1 Documented in Chart     Comment 2 Notify RN    GLUCOSE, CAPILLARY     Status: Abnormal   Collection Time   10/07/11 10:16 PM      Component Value Range   Glucose-Capillary 161 (*) 70 - 99 (mg/dL)  GLUCOSE, CAPILLARY     Status: Abnormal   Collection Time   10/07/11 11:55 PM      Component Value Range   Glucose-Capillary 114 (*) 70 - 99 (mg/dL)   Comment 1 Documented in Chart     Comment 2 Notify RN    BASIC METABOLIC  PANEL     Status: Abnormal   Collection Time   10/08/11  3:35 AM      Component Value Range   Sodium 137  135 - 145 (mEq/L)   Potassium 3.1 (*) 3.5 - 5.1 (mEq/L)   Chloride 102  96 - 112 (mEq/L)   CO2 23  19 - 32 (mEq/L)   Glucose, Bld 110 (*) 70 - 99 (mg/dL)   BUN 8  6 - 23 (mg/dL)   Creatinine, Ser 9.60  0.50 - 1.10 (mg/dL)   Calcium 8.4  8.4 - 45.4 (mg/dL)   GFR calc non Af Amer >90  >90 (mL/min)   GFR calc Af Amer >90  >90 (mL/min)  CBC     Status: Abnormal   Collection Time   10/08/11  3:35 AM      Component Value Range   WBC 15.0 (*) 4.0 - 10.5 (K/uL)   RBC 3.94  3.87 - 5.11 (MIL/uL)   Hemoglobin 11.8 (*) 12.0 - 15.0 (g/dL)   HCT 09.8 (*) 11.9 - 46.0 (%)   MCV 88.8  78.0 - 100.0 (fL)   MCH 29.9  26.0 - 34.0 (pg)   MCHC 33.7  30.0 - 36.0 (g/dL)   RDW 14.7  82.9 - 56.2 (%)   Platelets 212  150 - 400 (K/uL)  GLUCOSE, CAPILLARY     Status: Abnormal  Collection Time   10/08/11  3:46 AM      Component Value Range   Glucose-Capillary 109 (*) 70 - 99 (mg/dL)   Comment 1 Documented in Chart     Comment 2 Notify RN       Studies/Results: @RISRSLT24 @     . acetaminophen  1,000 mg Intravenous Q6H  . dextrose      . ertapenem [facility]) IV  1 g Intravenous Q24H  . fentaNYL   Intravenous Q4H  . furosemide  40 mg Intravenous Once  . heparin  5,000 Units Subcutaneous Q8H  . pantoprazole (PROTONIX) IV  40 mg Intravenous Q24H  . potassium chloride  10 mEq Intravenous Q1 Hr x 5  . potassium chloride  10 mEq Intravenous Q1 Hr x 4     Assessment/Plan: s/p Procedure(s): EXPLORATORY LAPAROTOMY, LEFT COLECTOMY WITH COLOSTOMY, LAVAGE  POD #4. Stable with slowly improving respiratory status. Quite deconditioned.  Allow clear liquids p.o.  Hypokalemia. Continue KCl runs.  Fever. This might be simply due to atelectasis. However, due to the extent of fecal contamination and peritonitis, she is at high risk for septic intra-abdominal complications.  Chest x-ray this  morning.  Suspect she will need CT scan of abdomen this coming week unless fevers resolved.    LOS: 4 days    Rhanda Lemire M. Derrell Lolling, M.D., [facility] General and Minimally invasive Surgery Breast and Colorectal Surgery Office:   [phone] Pager:   [phone]  10/08/2011  . .prob

## 2011-10-08 NOTE — Progress Notes (Signed)
Name: Stacy Dalton MRN: [id number] DOB: 03/16/1951    LOS: 4  PCCM PROGRESS NOTE  Brief patient profile:  61 yowf former smoker [facility] ED on 4/3 with acute onset abdominal pain.  She was febrile and tachycardic. Imaging demonstrated large amount of intraperitoneal air.  Antibiotics were given.  She was taken to OR for emergency exploratory laparotomy which demonstrated perforated colon and diffuse peritonitis.  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  She was extubated in OR and brought to ICU for further management.  In ICU she was transiently hypotensive after being medicated with Dialudid but responded to fluids.     Lines / Drains:    Cultures: Recent Results (from the past 240 hour(s))  MRSA PCR SCREENING     Status: Normal   Collection Time   10/04/11  8:48 PM      Component Value Range Status Comment   MRSA by PCR NEGATIVE  NEGATIVE  Final      Antibiotics: 4/3  Zosyn x 1 4/3  Vancomycin > 4/4 4/3  Ertapenem (peritonitis) >>>  Anti-infectives     Start     Dose/Rate Route Frequency Ordered Stop   10/04/11 2359   piperacillin-tazobactam (ZOSYN) IVPB 3.375 g  Status:  Discontinued        3.375 g 12.5 mL/hr over 240 Minutes Intravenous 3 times per day 10/04/11 1607 10/04/11 2040   10/04/11 2200   ertapenem (INVANZ) 1 g in sodium chloride 0.9 % 50 mL IVPB        1 g 100 mL/hr over 30 Minutes Intravenous Every 24 hours 10/04/11 2040     10/04/11 2000   vancomycin (VANCOCIN) 750 mg in sodium chloride 0.9 % 150 mL IVPB  Status:  Discontinued        750 mg 150 mL/hr over 60 Minutes Intravenous Every 8 hours 10/04/11 1921 10/05/11 1523   10/04/11 1600  piperacillin-tazobactam (ZOSYN) IVPB 3.375 g       3.375 g 12.5 mL/hr over 240 Minutes Intravenous  Once 10/04/11 1528 10/04/11 1958           Tests / Events: 4/3  Abdomen CT >>>  Free intraperitoneal air, fluid in peritoneal cavity, prominent bile duct 4/4  Elploratory laparotomy, colectomy,  colostomy    Subjective:    No overnight issues perRN.  Pain improved but flatus and abd distension most distress to patient. Deconditioned Also febrile despite antibiotics RN plans to walk patient  Vital Signs: Filed Vitals:   10/08/11 0500 10/08/11 0600 10/08/11 0731 10/08/11 0800  BP:  128/81  150/55  Pulse:  95  102  Temp:  100.6 F (38.1 C)  100.6 F (38.1 C)  TempSrc:      Resp:  16 16 25   Height:      Weight: 68 kg (149 lb 14.6 oz)     SpO2:  96% 97% 96%    02  @ 5 lpm np    Intake/Output Summary (Last 24 hours) at 10/08/11 1037 Last data filed at 10/08/11 0900  Gross per 24 hour  Intake 1314.83 ml  Output   2595 ml  Net -1280.17 ml    Physical Examination: General:  Appears chronically ill, , no acute distress, very deconditioned Neuro:  Sleeping HEENT:  NGT, dry membranes Neck:  Supple, no JVD   Cardiovascular:  RRR, no M/R/G Lungs:  Bilateral diminished air entry, resp's even/non-labored Abdomen:  Clean surgical dressing, viable colostomy, tenderness to palpation, absent bowel sounds Musculoskeletal:  Moves  all extremities, no pedal edema Skin:  No rash   Labs and Imaging:  Reviewed.  Please refer to the Assessment and Plan section for relevant results.  Dg Chest Port 1 View  10/08/2011  *RADIOLOGY REPORT*  Clinical Data: Follow up  PORTABLE CHEST - 1 VIEW  Comparison: 10/06/2011  Findings: No frank interstitial edema.  Suspected small bilateral pleural effusions, improved.  No pneumothorax.  The heart is normal in size.  IMPRESSION: Suspected small bilateral pleural effusions, improved.  No frank interstitial edema.  Original Report Authenticated By: Charline Bills, M.D.    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 P: -->  Supplemental oxygen -->  Incentive spirometry -->  Pain control  -->  Follow up cxr periodically -> check bnp at 15:00h and consider  diuresis   CARDIOVASCULAR A:  SIRS secondary to peritonitis.  On 10/08/2011 still febrile with WC 11.8K  P: -->  Goal MAP 60-65 -->  No indications for CVL / Vasopressors at this time - check   RENAL  Lab 10/08/11 0335 10/07/11 0347 10/06/11 0340 10/05/11 1641 10/05/11 0347  NA 137 137 137 135 138  K 3.1* 3.5 -- -- --  CL 102 104 107 105 104  CO2 23 24 25 25 27   BUN 8 6 9 9 9   CREATININE 0.50 0.52 0.58 0.58 0.68  CALCIUM 8.4 8.5 8.1* 7.7* 7.4*  MG -- 1.7 2.2 -- 2.0  PHOS -- -- -- -- 2.3   A:  Normal renal function.  Hypokalemia 4/7. Hypomagnesemia 4/6   -> Kcl repleted by elink - Will replete Mag 10:41 AM on 10/08/2011 >  Follow up BMP at 3pm 10/08/2011   GASTROINTESTINAL  Lab 10/04/11 1345  AST 34  ALT 24  ALKPHOS 75  BILITOT 0.4  PROT 7.1  ALBUMIN 4.3   A:  Perforated viscus s/p colectomy / colostomy. P: -->  Per CCS  HEMATOLOGIC  Lab 10/08/11 0335 10/07/11 0347 10/06/11 0340 10/05/11 0347 10/04/11 1952  HGB 11.8* 10.8* 11.1* 13.1 13.3  HCT 35.0* 33.0* 34.2* 39.8 40.4  PLT 212 190 150 242 296  INR -- -- -- 1.22 --  APTT -- -- -- 35 --   A:  No active issues. P: -->  CBC in AM  INFECTIOUS  Lab 10/08/11 0335 10/07/11 0347 10/06/11 0340 10/05/11 0347 10/05/11 0001 10/04/11 1952  WBC 15.0* 12.7* 10.1 3.7* -- 1.7*  PROCALCITON -- -- -- -- 17.61 --   A:  Peritonitis.  Reactive leukopenia. Still febrile as of 10/08/2011 P: -->  Follow PCT and lactate, repeat 10/08/11 at 3pm -->  Continue Ertapenem -->  If signs of sepsis would add Micafungin  ENDOCRINE A:  No active issues. P: -->  No interventions  required  BEST PRACTICE / DISPOSITION -->  ICU status under CCS -->  PCCM consulting -->  Full code -->  NPO -->  Heparin [place] for DVT Px -->  Protonix IV for GI Px   Dr. Kalman Shan, M.D., [facility].C.P Pulmonary and Critical Care Medicine Staff Physician [facility] [place] Pulmonary and Critical Care Pager: [phone], If no answer or between  15:00h - 7:00h: call [phone]  10/08/2011 10:37 AM

## 2011-10-09 ENCOUNTER — Inpatient Hospital Stay (HOSPITAL_COMMUNITY)

## 2011-10-09 DIAGNOSIS — J449 Chronic obstructive pulmonary disease, unspecified: Secondary | ICD-10-CM | POA: Diagnosis present

## 2011-10-09 LAB — COMPREHENSIVE METABOLIC PANEL
ALT: 19 U/L (ref 0–35)
AST: 19 U/L (ref 0–37)
Albumin: 1.8 g/dL — ABNORMAL LOW (ref 3.5–5.2)
Alkaline Phosphatase: 83 U/L (ref 39–117)
BUN: 6 mg/dL (ref 6–23)
CO2: 26 mEq/L (ref 19–32)
Calcium: 8.1 mg/dL — ABNORMAL LOW (ref 8.4–10.5)
Chloride: 103 mEq/L (ref 96–112)
Creatinine, Ser: 0.45 mg/dL — ABNORMAL LOW (ref 0.50–1.10)
GFR calc Af Amer: >90 mL/min (ref >90)
GFR calc non Af Amer: >90 mL/min (ref >90)
Glucose, Bld: 128 mg/dL — ABNORMAL HIGH (ref 70–99)
Potassium: 4 mEq/L (ref 3.5–5.1)
Sodium: 134 mEq/L — ABNORMAL LOW (ref 135–145)
Total Bilirubin: 0.3 mg/dL (ref 0.3–1.2)
Total Protein: 5 g/dL — ABNORMAL LOW (ref 6.0–8.3)

## 2011-10-09 LAB — GLUCOSE, CAPILLARY
Glucose-Capillary: 134 mg/dL — ABNORMAL HIGH (ref 70–99)
Glucose-Capillary: 114 mg/dL — ABNORMAL HIGH (ref 70–99)
Glucose-Capillary: 139 mg/dL — ABNORMAL HIGH (ref 70–99)
Glucose-Capillary: 121 mg/dL — ABNORMAL HIGH (ref 70–99)
Glucose-Capillary: 126 mg/dL — ABNORMAL HIGH (ref 70–99)

## 2011-10-09 LAB — CBC
HCT: 34.3 % — ABNORMAL LOW (ref 36.0–46.0)
Hemoglobin: 11.8 g/dL — ABNORMAL LOW (ref 12.0–15.0)
MCH: 30 pg (ref 26.0–34.0)
MCHC: 34.4 g/dL (ref 30.0–36.0)
MCV: 87.3 fL (ref 78.0–100.0)
Platelets: 218 10*3/uL (ref 150–400)
RBC: 3.93 MIL/uL (ref 3.87–5.11)
RDW: 13.1 % (ref 11.5–15.5)
WBC: 11.4 10*3/uL — ABNORMAL HIGH (ref 4.0–10.5)

## 2011-10-09 MED ORDER — PROMETHAZINE HCL 25 MG/ML IJ SOLN
12.5000 mg | Freq: Four times a day (QID) | INTRAMUSCULAR | Status: DC | PRN
  Administered 2011-10-09 – 2011-10-10 (×4): 12.5 mg via INTRAVENOUS
  Administered 2011-10-10: 25 mg via INTRAVENOUS
  Administered 2011-10-10 – 2011-10-16 (×18): 12.5 mg via INTRAVENOUS
  Administered 2011-10-16: 20:00:00 via INTRAVENOUS
  Administered 2011-10-17 – 2011-10-24 (×28): 12.5 mg via INTRAVENOUS
  Filled 2011-10-09 (×52): qty 1

## 2011-10-09 MED ORDER — SODIUM CHLORIDE 0.9 % IJ SOLN
10.0000 mL | Freq: Two times a day (BID) | INTRAMUSCULAR | Status: DC
  Administered 2011-10-10 (×2): 10 mL
  Administered 2011-10-11: 20 mL
  Administered 2011-10-11 – 2011-10-20 (×11): 10 mL

## 2011-10-09 MED ORDER — SODIUM CHLORIDE 0.9 % IJ SOLN
10.0000 mL | INTRAMUSCULAR | Status: DC | PRN
Start: 2011-10-09 — End: 2011-10-24
  Administered 2011-10-09 – 2011-10-24 (×11): 10 mL

## 2011-10-09 NOTE — Progress Notes (Signed)
5 Days Post-Op  Subjective: She had nausea and vomited last night, also having crampy abdominal pain. Nausea not improved with Zofran. Has stool out ostomy and has been oob yesterday  Objective: Vital signs in last 24 hours: Temp:  [100 F (37.8 C)-101.5 F (38.6 C)] 100.4 F (38 C) (04/08 0400) Pulse Rate:  [93-109] 93  (04/08 0400) Resp:  [18-35] 28  (04/08 0400) BP: (109-150)/(55-78) 109/63 mmHg (04/08 0400) SpO2:  [95 %-99 %] 97 % (04/08 0400) Weight:  [150 lb 9.2 oz (68.3 kg)] 150 lb 9.2 oz (68.3 kg) (04/08 0611)   Intake/Output from previous day: 04/07 0701 - 04/08 0700 In: 2434.8 [I.V.:1834.8; IV Piggyback:600] Out: 2555 [Urine:2455; Stool:100] Intake/Output this shift:     General appearance: alert, cooperative and mild distress Resp: clear to auscultation bilaterally GI: Mildly distended but soft and minimally tender. Ostomy pink and stool in ostomy bag  Incision: Wound is clean, being dressed daily.  Lab Results:   Basename 10/09/11 0340 10/08/11 0335  WBC 11.4* 15.0*  HGB 11.8* 11.8*  HCT 34.3* 35.0*  PLT 218 212   BMET  Basename 10/09/11 0340 10/08/11 1532  NA 134* 135  K 4.0 3.5  CL 103 104  CO2 26 24  GLUCOSE 128* 127*  BUN 6 7  CREATININE 0.45* 0.39*  CALCIUM 8.1* 8.3*   PT/INR No results found for this basename: LABPROT:2,INR:2 in the last 72 hours ABG No results found for this basename: PHART:2,PCO2:2,PO2:2,HCO3:2 in the last 72 hours  MEDS, Scheduled    . ertapenem [facility]) IV  1 g Intravenous Q24H  . fentaNYL   Intravenous Q4H  . heparin  5,000 Units Subcutaneous Q8H  . magnesium sulfate 1 - 4 g bolus IVPB  1 g Intravenous Once  . pantoprazole (PROTONIX) IV  40 mg Intravenous Q24H  . potassium chloride  10 mEq Intravenous Q1 Hr x 4  . vitamin A & D      . DISCONTD: magnesium sulfate LVP 250-500 ml  1 g Intravenous Once    Studies/Results: Dg Chest Port 1 View  10/08/2011  *RADIOLOGY REPORT*  Clinical Data: Follow up  PORTABLE  CHEST - 1 VIEW  Comparison: 10/06/2011  Findings: No frank interstitial edema.  Suspected small bilateral pleural effusions, improved.  No pneumothorax.  The heart is normal in size.  IMPRESSION: Suspected small bilateral pleural effusions, improved.  No frank interstitial edema.  Original Report Authenticated By: Charline Bills, M.D.    Assessment: s/p Procedure(s): EXPLORATORY LAPAROTOMY Stable, but persistent low grade fever. WBC improved. Maybe having gas cramps as ileus resolves. Hypokalemia improved. Worry about intra-abd abscess given ongoing fever and severe contamination noted at surgery  Plan: Will change to phenergan for nausea, obtain CT scan, repeat cbc in am   LOS: 5 days     Currie Paris, MD, [facility], [place] [phone]   10/09/2011 7:54 AM

## 2011-10-09 NOTE — Progress Notes (Signed)
Name: AALAYA YADAO MRN: [id number] DOB: 06/19/1951    LOS: 5  PCCM PROGRESS NOTE  Brief patient profile:  61 yowf former smoker [facility] ED on 4/3 with acute onset abdominal pain.  She was febrile and tachycardic. Imaging demonstrated large amount of intraperitoneal air.  Antibiotics were given.  She was taken to OR for emergency exploratory laparotomy which demonstrated perforated colon and diffuse peritonitis.  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  She was extubated in OR and brought to ICU for further management.  In ICU she was transiently hypotensive after being medicated with Dialudid but responded to fluids.     Lines / Drains:    Cultures: Recent Results (from the past 240 hour(s))  MRSA PCR SCREENING     Status: Normal   Collection Time   10/04/11  8:48 PM      Component Value Range Status Comment   MRSA by PCR NEGATIVE  NEGATIVE  Final      Antibiotics: 4/3  Zosyn x 1 4/3  Vancomycin > 4/4 4/3  Ertapenem (peritonitis) >>>  Anti-infectives     Start     Dose/Rate Route Frequency Ordered Stop   10/04/11 2359   piperacillin-tazobactam (ZOSYN) IVPB 3.375 g  Status:  Discontinued        3.375 g 12.5 mL/hr over 240 Minutes Intravenous 3 times per day 10/04/11 1607 10/04/11 2040   10/04/11 2200   ertapenem (INVANZ) 1 g in sodium chloride 0.9 % 50 mL IVPB        1 g 100 mL/hr over 30 Minutes Intravenous Every 24 hours 10/04/11 2040     10/04/11 2000   vancomycin (VANCOCIN) 750 mg in sodium chloride 0.9 % 150 mL IVPB  Status:  Discontinued        750 mg 150 mL/hr over 60 Minutes Intravenous Every 8 hours 10/04/11 1921 10/05/11 1523   10/04/11 1600  piperacillin-tazobactam (ZOSYN) IVPB 3.375 g       3.375 g 12.5 mL/hr over 240 Minutes Intravenous  Once 10/04/11 1528 10/04/11 1958           Tests / Events: 4/3  Abdomen CT >>>  Free intraperitoneal air, fluid in peritoneal cavity, prominent bile duct 4/4  Elploratory laparotomy, colectomy,  colostomy    Subjective:    Deconditioned Also febrile despite antibiotics, c/o pain , on fentnayl PCA RN plans to walk patient  Vital Signs: Filed Vitals:   10/09/11 0352 10/09/11 0400 10/09/11 0611 10/09/11 0800  BP:  109/63    Pulse:  93    Temp:  100.4 F (38 C)  98.8 F (37.1 C)  TempSrc:    Oral  Resp: 20 28    Height:      Weight:   68.3 kg (150 lb 9.2 oz)   SpO2: 99% 97%      02  @ 2-3 lpm np    Intake/Output Summary (Last 24 hours) at 10/09/11 0932 Last data filed at 10/09/11 0600  Gross per 24 hour  Intake 2073.3 ml  Output   2370 ml  Net -296.7 ml    Physical Examination: General:  Appears chronically ill, , no acute distress, very deconditioned Neuro:  Awake , interactive, ET cO2 32 HEENT:  NGT, dry membranes Neck:  Supple, no JVD   Cardiovascular:  RRR, no M/R/G Lungs:  Bilateral diminished air entry, resp's even/non-labored Abdomen:  Clean surgical dressing, viable colostomy, tenderness to palpation, absent bowel sounds Musculoskeletal:  Moves all extremities, no pedal edema  Skin:  No rash   Labs and Imaging:  Reviewed.  Please refer to the Assessment and Plan section for relevant results.  Dg Chest Port 1 View  10/08/2011  *RADIOLOGY REPORT*  Clinical Data: Follow up  PORTABLE CHEST - 1 VIEW  Comparison: 10/06/2011  Findings: No frank interstitial edema.  Suspected small bilateral pleural effusions, improved.  No pneumothorax.  The heart is normal in size.  IMPRESSION: Suspected small bilateral pleural effusions, improved.  No frank interstitial edema.  Original Report Authenticated By: Charline Bills, M.D.    Lab 10/08/11 1532 10/05/11 0001  PROCALCITON 1.49 17.61    Lab 10/09/11 0340 10/05/11 0347 10/04/11 1345  AST 19 -- 34  ALT 19 -- 24  ALKPHOS 83 -- 75  BILITOT 0.3 -- 0.4  PROT 5.0* -- 7.1  ALBUMIN 1.8* -- 4.3  INR -- 1.22 --     Lab 10/08/11 1532  PROBNP 1366.0*     Lab 10/09/11 0340 10/08/11 0335 10/07/11 0347  HGB 11.8*  11.8* 10.8*  HCT 34.3* 35.0* 33.0*  WBC 11.4* 15.0* 12.7*  PLT 218 212 190     ASSESSMENT AND PLAN   PULMONARY A:  COPD with post op hypoxia, likely postop atelectasis. P: -->  Supplemental oxygen -->  Incentive spirometry -->  Pain control    CARDIOVASCULAR A:  SIRS secondary to peritonitis.  On 10/09/2011 still febrile with WC 11.8K  P: -->  resolved    RENAL  Lab 10/09/11 0340 10/08/11 1532 10/08/11 0335 10/07/11 0347 10/06/11 0340 10/05/11 0347  NA 134* 135 137 137 137 --  K 4.0 3.5 -- -- -- --  CL 103 104 102 104 107 --  CO2 26 24 23 24 25  --  BUN 6 7 8 6 9  --  CREATININE 0.45* 0.39* 0.50 0.52 0.58 --  CALCIUM 8.1* 8.3* 8.4 8.5 8.1* --  MG -- 1.9 -- 1.7 2.2 2.0  PHOS -- 1.1* -- -- -- 2.3   A:  Normal renal function.  Hypokalemia 4/7. Hypomagnesemia 4/6   -> repleted, Na trending down, monitor  GASTROINTESTINAL  Lab 10/09/11 0340 10/04/11 1345  AST 19 34  ALT 19 24  ALKPHOS 83 75  BILITOT 0.3 0.4  PROT 5.0* 7.1  ALBUMIN 1.8* 4.3   A:  Perforated viscus s/p colectomy / colostomy. P: -->  Per [facility], RPt CT done 4/8   HEMATOLOGIC  Lab 10/09/11 0340 10/08/11 0335 10/07/11 0347 10/06/11 0340 10/05/11 0347  HGB 11.8* 11.8* 10.8* 11.1* 13.1  HCT 34.3* 35.0* 33.0* 34.2* 39.8  PLT 218 212 190 150 242  INR -- -- -- -- 1.22  APTT -- -- -- -- 35   A:  No active issues. P: -->  CBC in AM  INFECTIOUS  Lab 10/09/11 0340 10/08/11 1532 10/08/11 0335 10/07/11 0347 10/06/11 0340 10/05/11 0347 10/05/11 0001  WBC 11.4* -- 15.0* 12.7* 10.1 3.7* --  PROCALCITON -- 1.49 -- -- -- -- 17.61   A:  Peritonitis.  Reactive leukopenia. Still febrile as of 10/09/2011 P: -->  Follow PCT and lactate, repeat 10/08/11 1.0 trended down -->  Continue Ertapenem -->  If signs of sepsis would add Micafungin  ENDOCRINE A:  No active issues. P: -->  No interventions required  BEST PRACTICE / DISPOSITION -->  SD status under [facility] -->  Full code -->  liquids -->  Heparin [place] for  DVT Px -->  Protonix IV for GI Px  PCCM to sign off, pl reconsult  As  needed OK to transfer out once off PCA  Sherri Levenhagen V.   10/09/2011 9:32 AM

## 2011-10-10 LAB — CBC
HCT: 31.9 % — ABNORMAL LOW (ref 36.0–46.0)
Hemoglobin: 10.4 g/dL — ABNORMAL LOW (ref 12.0–15.0)
MCH: 29.3 pg (ref 26.0–34.0)
MCHC: 32.6 g/dL (ref 30.0–36.0)
MCV: 89.9 fL (ref 78.0–100.0)
Platelets: 234 10*3/uL (ref 150–400)
RBC: 3.55 MIL/uL — ABNORMAL LOW (ref 3.87–5.11)
RDW: 13.5 % (ref 11.5–15.5)
WBC: 11.1 10*3/uL — ABNORMAL HIGH (ref 4.0–10.5)

## 2011-10-10 LAB — GLUCOSE, CAPILLARY
Glucose-Capillary: 69 mg/dL — ABNORMAL LOW (ref 70–99)
Glucose-Capillary: 136 mg/dL — ABNORMAL HIGH (ref 70–99)
Glucose-Capillary: 139 mg/dL — ABNORMAL HIGH (ref 70–99)

## 2011-10-10 LAB — BASIC METABOLIC PANEL
BUN: 6 mg/dL (ref 6–23)
CO2: 27 mEq/L (ref 19–32)
Calcium: 8.3 mg/dL — ABNORMAL LOW (ref 8.4–10.5)
Chloride: 101 mEq/L (ref 96–112)
Creatinine, Ser: 0.47 mg/dL — ABNORMAL LOW (ref 0.50–1.10)
GFR calc Af Amer: >90 mL/min (ref >90)
GFR calc non Af Amer: >90 mL/min (ref >90)
Glucose, Bld: 124 mg/dL — ABNORMAL HIGH (ref 70–99)
Potassium: 3.7 mEq/L (ref 3.5–5.1)
Sodium: 133 mEq/L — ABNORMAL LOW (ref 135–145)

## 2011-10-10 MED ORDER — METOCLOPRAMIDE HCL 5 MG/ML IJ SOLN
5.0000 mg | Freq: Three times a day (TID) | INTRAMUSCULAR | Status: AC
  Administered 2011-10-10 – 2011-10-12 (×10): 5 mg via INTRAVENOUS
  Filled 2011-10-10 (×4): qty 1
  Filled 2011-10-10: qty 2
  Filled 2011-10-10: qty 1
  Filled 2011-10-10 (×3): qty 2
  Filled 2011-10-10: qty 1
  Filled 2011-10-10: qty 2
  Filled 2011-10-10: qty 1
  Filled 2011-10-10 (×3): qty 2
  Filled 2011-10-10: qty 1

## 2011-10-10 MED ORDER — BIOTENE DRY MOUTH MT LIQD
15.0000 mL | Freq: Two times a day (BID) | OROMUCOSAL | Status: DC
  Administered 2011-10-10 – 2011-10-24 (×27): 15 mL via OROMUCOSAL

## 2011-10-10 NOTE — Progress Notes (Signed)
6 Days Post-Op  Subjective: Pt. Looks tired but over all doing well.  Still having nausea, with clear liquids, not allot but enough to be annoying.  Still having allot of pain, but  It's better than before.  Getting up to chair.  Objective: Vital signs in last 24 hours: Temp:  [98.6 F (37 C)-101.1 F (38.4 C)] 100.2 F (37.9 C) (04/09 0400) Pulse Rate:  [90-105] 93  (04/09 0400) Resp:  [15-25] 16  (04/09 0400) BP: (100-112)/(55-70) 103/55 mmHg (04/09 0400) SpO2:  [26 %-97 %] 26 % (04/09 0400) Last BM Date: 10/09/11 Still running fever up to 101.1, Tachycardic, BP is stable, labs stable. Intake/Output from previous day: 04/08 0701 - 04/09 0700 In: 2495 [P.O.:660; I.V.:1725; IV Piggyback:50] Out: 2720 [Urine:2290; Stool:430] Intake/Output this shift:    General appearance: alert, cooperative, no distress and tired. Resp: clear to auscultation bilaterally and few rales at the base. GI: soft, bowel sounds hypoactive, stool and gas in colostomy, stool is formed and solid, she had a good deal of constipation at surgery.  Wound is open, pretty clean some gel like stuff at the base.  Packed wet to dry.  Lab Results:   Basename 10/10/11 0500 10/09/11 0340  WBC 11.1* 11.4*  HGB 10.4* 11.8*  HCT 31.9* 34.3*  PLT 234 218    BMET  Basename 10/10/11 0500 10/09/11 0340  NA 133* 134*  K 3.7 4.0  CL 101 103  CO2 27 26  GLUCOSE 124* 128*  BUN 6 6  CREATININE 0.47* 0.45*  CALCIUM 8.3* 8.1*   PT/INR No results found for this basename: LABPROT:2,INR:2 in the last 72 hours   Lab 10/09/11 0340 10/04/11 1345  AST 19 34  ALT 19 24  ALKPHOS 83 75  BILITOT 0.3 0.4  PROT 5.0* 7.1  ALBUMIN 1.8* 4.3     Lipase     Component Value Date/Time   LIPASE 20 10/04/2011 1345     Studies/Results: Ct Abdomen Pelvis Wo Contrast  10/09/2011  *RADIOLOGY REPORT*  Clinical Data: Colectomy and colostomy for bowel perforation. Abdominal pain.  Nausea and vomiting.  Constipation.  Query abscess.   CT ABDOMEN AND PELVIS WITHOUT CONTRAST  Technique:  Multidetector CT imaging of the abdomen and pelvis was performed following the standard protocol without intravenous contrast.  Comparison: 10/04/2011  Findings: Small bilateral pleural effusions are present with associated passive atelectasis, right greater than left.  Linear subsegmental atelectasis noted in the right middle lobe and lingula.  The previous free intraperitoneal gas has resolved.  Continued perihepatic and perisplenic ascites along with ascites tracking along the paracolic gutters and into the pelvis.  The visualized portion of the liver, spleen, pancreas, and adrenal glands appear unremarkable in noncontrast CT appearance.  Dependent density in the gallbladder could be from sludge.  Left colostomy noted.  Laparotomy wound noted.  There is no oral contrast in the bowel, which can make identification of intra- abdominal abscess is problematic, especially there is normal. However, I do not discern findings of high suspicion for abscess. There is gas in the urinary bladder which is probably incidental. There also thick-walled loops of small bowel in the pelvis, along with some scattered mildly dilated loops of small bowel.  The kidneys appear unremarkable, as do the proximal ureters.  Atherosclerotic calcification of the aortoiliac tree is noted.  A structure thought to represent part of the appendix measures 7 mm in diameter.  In addition to the ascites, there is low-level presacral edema and underlying mild  mesenteric edema.  Mild prominence of stool noted in the colon.  There is atherosclerotic calcification in the left anterior descending coronary artery.  IMPRESSION:  1.  Ascites is present but no definite abscess is observed.  The lack of oral and IV contrast reduces sensitivity. 2.  Mesenteric and presacral edema noted. 3.  The prior free burden of gas has resolved.  Left colostomy noted. 4.  Bilateral small pleural effusions with passive  atelectasis. 5.  Coronary artery atherosclerosis. 6.  Mild prominence of stool in the colon.  Original Report Authenticated By: Dellia Cloud, M.D.    Medications:    . antiseptic oral rinse  15 mL Mouth Rinse BID  . ertapenem (INVANZ) IV  1 g Intravenous Q24H  . fentaNYL   Intravenous Q4H  . heparin  5,000 Units Subcutaneous Q8H  . pantoprazole (PROTONIX) IV  40 mg Intravenous Q24H  . 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 Derrell Lolling Post op fever/tachycardia/hypotension CT scan 10/09/11: ascites, mesenteric edema, pleural effusions, no abscess noted. Migraine  Hypertension  CHRONIC BACK PAIN she uses hydrocodone almost daily HISTORY OF TOBACCO USE/NONE FOR 13 YEARS    Plan:  Continue antibiotics, still worried about abscess with her.  Add some reglan for nausea, see if it helps.  I would not increase diet till her nausea is resolved.  She has a PICC line , we have not started TNA. POD#6. Continue to monitor closely, I don't think she's ready for transfer to floor.  She is getting PT.      LOS: 6 days    Stacy Dalton 10/10/2011

## 2011-10-10 NOTE — Progress Notes (Signed)
Seems to be stable - reviewed CT and don't see obvious abscess to account for low grade fever. Will try Reglan to see if that helps with nausea.

## 2011-10-11 ENCOUNTER — Inpatient Hospital Stay (HOSPITAL_COMMUNITY)

## 2011-10-11 LAB — URINALYSIS, ROUTINE W REFLEX MICROSCOPIC
Bilirubin Urine: NEGATIVE
Glucose, UA: NEGATIVE mg/dL
Hgb urine dipstick: NEGATIVE
Ketones, ur: NEGATIVE mg/dL
Leukocytes, UA: NEGATIVE
Nitrite: NEGATIVE
Protein, ur: NEGATIVE mg/dL
Specific Gravity, Urine: 1.008 (ref 1.005–1.030)
Urobilinogen, UA: 0.2 mg/dL (ref 0.0–1.0)
pH: 7 (ref 5.0–8.0)

## 2011-10-11 LAB — COMPREHENSIVE METABOLIC PANEL
ALT: 51 U/L — ABNORMAL HIGH (ref 0–35)
AST: 42 U/L — ABNORMAL HIGH (ref 0–37)
Albumin: 1.7 g/dL — ABNORMAL LOW (ref 3.5–5.2)
Alkaline Phosphatase: 70 U/L (ref 39–117)
BUN: 5 mg/dL — ABNORMAL LOW (ref 6–23)
CO2: 28 mEq/L (ref 19–32)
Calcium: 8.2 mg/dL — ABNORMAL LOW (ref 8.4–10.5)
Chloride: 100 mEq/L (ref 96–112)
Creatinine, Ser: 0.43 mg/dL — ABNORMAL LOW (ref 0.50–1.10)
GFR calc Af Amer: >90 mL/min (ref >90)
GFR calc non Af Amer: >90 mL/min (ref >90)
Glucose, Bld: 128 mg/dL — ABNORMAL HIGH (ref 70–99)
Potassium: 3.8 mEq/L (ref 3.5–5.1)
Sodium: 134 mEq/L — ABNORMAL LOW (ref 135–145)
Total Bilirubin: 0.2 mg/dL — ABNORMAL LOW (ref 0.3–1.2)
Total Protein: 5.1 g/dL — ABNORMAL LOW (ref 6.0–8.3)

## 2011-10-11 LAB — GLUCOSE, CAPILLARY
Glucose-Capillary: 110 mg/dL — ABNORMAL HIGH (ref 70–99)
Glucose-Capillary: 116 mg/dL — ABNORMAL HIGH (ref 70–99)
Glucose-Capillary: 119 mg/dL — ABNORMAL HIGH (ref 70–99)

## 2011-10-11 LAB — MAGNESIUM: Magnesium: 1.7 mg/dL (ref 1.5–2.5)

## 2011-10-11 LAB — CBC
HCT: 30.3 % — ABNORMAL LOW (ref 36.0–46.0)
Hemoglobin: 10.2 g/dL — ABNORMAL LOW (ref 12.0–15.0)
MCH: 29.9 pg (ref 26.0–34.0)
MCHC: 33.7 g/dL (ref 30.0–36.0)
MCV: 88.9 fL (ref 78.0–100.0)
Platelets: 287 10*3/uL (ref 150–400)
RBC: 3.41 MIL/uL — ABNORMAL LOW (ref 3.87–5.11)
RDW: 13.4 % (ref 11.5–15.5)
WBC: 11.9 10*3/uL — ABNORMAL HIGH (ref 4.0–10.5)

## 2011-10-11 NOTE — Consult Note (Signed)
WOC ostomy consult  Pt very nauseated, and having some pain. Pouch intact with good seal. Last changed 10/09/11 Stoma type/location: LLQ, colostomy Stomal assessment/size: round and budded, observed thru pouch today.  Output; pasty brown minimal amounts Ostomy pouching: 1pc. Pouch intact  Education provided: Pt lives at home with husband who has Stage IV lung CA, he is in poor health and pt reports he gets chemo every Friday.  She is encouraging him to stay out of hospital at this time due to him being compromised due to chemo. She reports however he would be her support for care at home. She has five children as well (1 daughter and 4 sons) she does indicate they are helpful and could assist if needed. Will assess once she is less nauseated what type of care she may be able to provide independently.  Will most likely need at least [facility] for support once she goes home.   WOC will follow along with you Aldena Worm Marlena Clipper, CWOCN  [phone]

## 2011-10-11 NOTE — Progress Notes (Signed)
Abdomen is soft, ostomy working- not clear why she is still having nausea and pain. May need a repeat scan on Friday

## 2011-10-11 NOTE — Progress Notes (Signed)
Pt ambulated about 10 feet in hall, VSS. Became tired and stomach cramped more with ambulation.

## 2011-10-11 NOTE — Progress Notes (Signed)
7 Days Post-Op  Subjective: Major complaint is pain and nausea.  She is on clears, Taking Zofran, phenergan, and started Reglan yesterday She is also running continuous low grade fevers Objective: Vital signs in last 24 hours: Temp:  [100.2 F (37.9 C)-101.5 F (38.6 C)] 100.4 F (38 C) (04/10 0350) Pulse Rate:  [92-104] 95  (04/10 0350) Resp:  [14-97] 25  (04/10 0400) BP: (103-111)/(54-64) 111/58 mmHg (04/10 0350) SpO2:  [18 %-98 %] 98 % (04/10 0400) Last BM Date: 10/09/11 Temp 100.4-101  BP is stable, HR stays in upper 90's,  WBC stable 11-12 K Intake/Output from previous day: 04/09 0701 - 04/10 0700 In: 2308.9 [P.O.:600; I.V.:1656.9; IV Piggyback:52] Out: 4250 [Urine:2875; Stool:1375] Intake/Output this shift:    General appearance: alert, cooperative and tired Resp: clear to auscultation bilaterally and some rales at bases. GI: Soft, Open incision is clean, packing wet to dry.  I used sterile applicator stick to keep a couple places open.  Stool in ostomy, and gas,came apart yesterday..No distension, complains of frequent pain with abd.  Using Pca frequently  Lab Results:   Basename 10/11/11 0400 10/10/11 0500  WBC 11.9* 11.1*  HGB 10.2* 10.4*  HCT 30.3* 31.9*  PLT 287 234    BMET  Basename 10/11/11 0400 10/10/11 0500  NA 134* 133*  K 3.8 3.7  CL 100 101  CO2 28 27  GLUCOSE 128* 124*  BUN 5* 6  CREATININE 0.43* 0.47*  CALCIUM 8.2* 8.3*   PT/INR No results found for this basename: LABPROT:2,INR:2 in the last 72 hours   Lab 10/11/11 0400 10/09/11 0340 10/04/11 1345  AST 42* 19 34  ALT 51* 19 24  ALKPHOS 70 83 75  BILITOT 0.2* 0.3 0.4  PROT 5.1* 5.0* 7.1  ALBUMIN 1.7* 1.8* 4.3     Lipase     Component Value Date/Time   LIPASE 20 10/04/2011 1345     Studies/Results: Ct Abdomen Pelvis Wo Contrast  10/09/2011  *RADIOLOGY REPORT*  Clinical Data: Colectomy and colostomy for bowel perforation. Abdominal pain.  Nausea and vomiting.  Constipation.  Query  abscess.  CT ABDOMEN AND PELVIS WITHOUT CONTRAST  Technique:  Multidetector CT imaging of the abdomen and pelvis was performed following the standard protocol without intravenous contrast.  Comparison: 10/04/2011  Findings: Small bilateral pleural effusions are present with associated passive atelectasis, right greater than left.  Linear subsegmental atelectasis noted in the right middle lobe and lingula.  The previous free intraperitoneal gas has resolved.  Continued perihepatic and perisplenic ascites along with ascites tracking along the paracolic gutters and into the pelvis.  The visualized portion of the liver, spleen, pancreas, and adrenal glands appear unremarkable in noncontrast CT appearance.  Dependent density in the gallbladder could be from sludge.  Left colostomy noted.  Laparotomy wound noted.  There is no oral contrast in the bowel, which can make identification of intra- abdominal abscess is problematic, especially there is normal. However, I do not discern findings of high suspicion for abscess. There is gas in the urinary bladder which is probably incidental. There also thick-walled loops of small bowel in the pelvis, along with some scattered mildly dilated loops of small bowel.  The kidneys appear unremarkable, as do the proximal ureters.  Atherosclerotic calcification of the aortoiliac tree is noted.  A structure thought to represent part of the appendix measures 7 mm in diameter.  In addition to the ascites, there is low-level presacral edema and underlying mild mesenteric edema.  Mild prominence of  stool noted in the colon.  There is atherosclerotic calcification in the left anterior descending coronary artery.  IMPRESSION:  1.  Ascites is present but no definite abscess is observed.  The lack of oral and IV contrast reduces sensitivity. 2.  Mesenteric and presacral edema noted. 3.  The prior free burden of gas has resolved.  Left colostomy noted. 4.  Bilateral small pleural effusions with  passive atelectasis. 5.  Coronary artery atherosclerosis. 6.  Mild prominence of stool in the colon.  Original Report Authenticated By: Dellia Cloud, M.D.    Medications:    . antiseptic oral rinse  15 mL Mouth Rinse BID  . ertapenem (INVANZ) IV  1 g Intravenous Q24H  . fentaNYL   Intravenous Q4H  . heparin  5,000 Units Subcutaneous Q8H  . metoCLOPramide (REGLAN) injection  5 mg Intravenous TID AC & HS  . pantoprazole (PROTONIX) IV  40 mg Intravenous Q24H  . 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 Derrell Lolling  Post op fever/tachycardia/hypotension  CT scan 10/09/11: ascites, mesenteric edema, pleural effusions, no abscess noted.  Migraine  Hypertension  CHRONIC BACK PAIN she uses hydrocodone almost daily  HISTORY OF TOBACCO USE/NONE FOR 13 YEARS    Plan:  I will check urine, get blood cultures for temp 101.5 or greater, Get a chest xray, Get her up to halls ambulating if possible. I'm going to try full liquids and see if that helps.    LOS: 7 days    Stacy Dalton 10/11/2011

## 2011-10-11 NOTE — Evaluation (Signed)
Physical Therapy Evaluation Patient Details Name: Stacy Dalton MRN: [id number] DOB: 1951-02-07 Today's Date: 10/11/2011  Problem List:  Patient Active Problem List  Diagnoses  . Perforated bowel  . Peritonitis  . SIRS (systemic inflammatory response syndrome)  . COPD (chronic obstructive pulmonary disease)    Past Medical History:  Past Medical History  Diagnosis Date  . Migraine   . Hypertension    Past Surgical History:  Past Surgical History  Procedure Date  . Tubal ligation   . Bladder tac     PT Assessment/Plan/Recommendation PT Assessment Clinical Impression Statement: Pt admitted with perforated bowel and s/p ex lap with colostomy.  Pt would benefit from acute PT services in order to improve independence with bed mobility, transfers, and ambulation in order to prepare for d/c home with spouse assist. PT Recommendation/Assessment: Patient will need skilled PT in the acute care venue PT Problem List: Decreased activity tolerance;Decreased mobility;Decreased knowledge of use of DME;Pain PT Therapy Diagnosis : Difficulty walking;Acute pain PT Plan PT Frequency: Min 3X/week PT Treatment/Interventions: DME instruction;Gait training;Stair training;Functional mobility training;Therapeutic activities;Therapeutic exercise;Patient/family education PT Recommendation Follow Up Recommendations: Home health PT Equipment Recommended: Rolling walker with 5" wheels PT Goals  Acute Rehab PT Goals PT Goal Formulation: With patient Time For Goal Achievement: 2 weeks Pt will go Supine/Side to Sit: with modified independence PT Goal: Supine/Side to Sit - Progress: Goal set today Pt will go Sit to Supine/Side: with modified independence PT Goal: Sit to Supine/Side - Progress: Goal set today Pt will go Sit to Stand: with modified independence PT Goal: Sit to Stand - Progress: Goal set today Pt will go Stand to Sit: with modified independence PT Goal: Stand to Sit - Progress: Goal  set today Pt will Ambulate: >150 feet;with modified independence;with least restrictive assistive device PT Goal: Ambulate - Progress: Goal set today Pt will Go Up / Down Stairs: 1-2 stairs;with supervision;with least restrictive assistive device PT Goal: Up/Down Stairs - Progress: Goal set today Pt will Perform Home Exercise Program: with supervision, verbal cues required/provided PT Goal: Perform Home Exercise Program - Progress: Goal set today  PT Evaluation Precautions/Restrictions  Precautions Precautions: Fall Prior Functioning  Home Living Lives With: Spouse Type of Home: House Home Layout: One level Home Access: Stairs to enter [employer] of Steps: 1 Home Adaptive Equipment: None Prior Function Level of Independence: Independent with gait;Independent with basic ADLs Cognition Cognition Arousal/Alertness: Awake/alert Overall Cognitive Status: Appears within functional limits for tasks assessed Sensation/Coordination   Extremity Assessment RLE Assessment RLE Assessment: Within Functional Limits (per functional observation) LLE Assessment LLE Assessment: Within Functional Limits (per functional observation) Mobility (including Balance) Bed Mobility Bed Mobility: Yes Supine to Sit: 3: Mod assist;HOB elevated (Comment degrees) Supine to Sit Details (indicate cue type and reason): assist for trunk, verbal cues for technique Sit to Supine: 3: Mod assist Sit to Supine - Details (indicate cue type and reason): assist for LEs onto bed Transfers Transfers: Yes Sit to Stand: 4: Min assist;With upper extremity assist;From bed Sit to Stand Details (indicate cue type and reason): min/guard Stand to Sit: To bed;4: Min assist Stand to Sit Details: min/guard Ambulation/Gait Ambulation/Gait: Yes Ambulation/Gait Assistance: 4: Min assist Ambulation/Gait Assistance Details (indicate cue type and reason): min/guard, pt requested to remain on 2L (she reports she felf a  little SOB with previous ambulation with RN) SaO2 99%, limited distance 2* fatigue and abdominal pain Ambulation Distance (Feet): 40 Feet Assistive device: Rolling walker Gait Pattern: Step-through pattern;Decreased stride length Gait  velocity: decreased    Exercise    End of Session PT - End of Session Activity Tolerance: Patient limited by fatigue;Patient limited by pain Patient left: in bed;with call bell in reach;with family/visitor present General Behavior During Session: Stacy Dalton for tasks performed Cognition: Stacy Dalton for tasks performed  Audrianna Driskill,KATHrine E 10/11/2011, 4:08 PM   Signature: Tamala Ser, PT, DPT Pager: [phone] 10/11/2011

## 2011-10-12 LAB — GLUCOSE, CAPILLARY
Glucose-Capillary: 130 mg/dL — ABNORMAL HIGH (ref 70–99)
Glucose-Capillary: 107 mg/dL — ABNORMAL HIGH (ref 70–99)
Glucose-Capillary: 111 mg/dL — ABNORMAL HIGH (ref 70–99)
Glucose-Capillary: 101 mg/dL — ABNORMAL HIGH (ref 70–99)
Glucose-Capillary: 125 mg/dL — ABNORMAL HIGH (ref 70–99)

## 2011-10-12 MED ORDER — ACETAMINOPHEN 325 MG PO TABS
650.0000 mg | ORAL_TABLET | ORAL | Status: DC | PRN
  Administered 2011-10-12 – 2011-10-14 (×3): 650 mg via ORAL
  Filled 2011-10-12 (×4): qty 2

## 2011-10-12 MED ORDER — PANTOPRAZOLE SODIUM 40 MG IV SOLR
40.0000 mg | Freq: Two times a day (BID) | INTRAVENOUS | Status: DC
  Administered 2011-10-12 – 2011-10-22 (×21): 40 mg via INTRAVENOUS
  Filled 2011-10-12 (×21): qty 40

## 2011-10-12 MED ORDER — TRAMADOL HCL 50 MG PO TABS
100.0000 mg | ORAL_TABLET | Freq: Two times a day (BID) | ORAL | Status: DC | PRN
  Administered 2011-10-12 – 2011-10-19 (×12): 100 mg via ORAL
  Filled 2011-10-12 (×5): qty 2
  Filled 2011-10-12 (×4): qty 1
  Filled 2011-10-12 (×2): qty 2
  Filled 2011-10-12: qty 1
  Filled 2011-10-12: qty 2
  Filled 2011-10-12: qty 1
  Filled 2011-10-12: qty 2

## 2011-10-12 NOTE — Evaluation (Signed)
Occupational Therapy Evaluation Patient Details Name: Stacy Dalton MRN: [id number] DOB: 1950/10/18 Today's Date: 10/12/2011 EV2  10:15-10:47 Problem List:  Patient Active Problem List  Diagnoses  . Perforated bowel  . Peritonitis  . SIRS (systemic inflammatory response syndrome)  . COPD (chronic obstructive pulmonary disease)    Past Medical History:  Past Medical History  Diagnosis Date  . Migraine   . Hypertension    Past Surgical History:  Past Surgical History  Procedure Date  . Tubal ligation   . Bladder tac     OT Assessment/Plan/Recommendation OT Assessment Clinical Impression Statement: Pt admitted for perforated bowel and underwent exploratory lap with colosotomy placed.  Pt would benefit from con't OT to increase strenth and edurance with all adls.  Pt needs to be at S level to return home with husband who is undergoing cancer treatments. OT Recommendation/Assessment: Patient will need skilled OT in the acute care venue OT Problem List: Decreased strength;Decreased activity tolerance;Impaired balance (sitting and/or standing);Decreased knowledge of use of DME or AE;Pain Barriers to Discharge: None Barriers to Discharge Comments: husband can care for self but won't be able to assist her much.  Pt does have 5 children, one of which lives down the street from her. OT Therapy Diagnosis : Generalized weakness;Acute pain OT Plan OT Frequency: Min 2X/week OT Treatment/Interventions: Self-care/ADL training;DME and/or AE instruction;Therapeutic activities OT Recommendation Follow Up Recommendations: Home health OT Equipment Recommended: 3 in 1 bedside comode Individuals Consulted Consulted and Agree with Results and Recommendations: Patient OT Goals Acute Rehab OT Goals OT Goal Formulation: With patient Time For Goal Achievement: 2 weeks ADL Goals Pt Will Perform Grooming: with supervision;Standing at sink ADL Goal: Grooming - Progress: Goal set today Pt Will  Perform Lower Body Bathing: with supervision;Sit to stand from chair ADL Goal: Lower Body Bathing - Progress: Goal set today Pt Will Perform Lower Body Dressing: with supervision;Sit to stand from chair;with adaptive equipment ADL Goal: Lower Body Dressing - Progress: Goal set today Pt Will Transfer to Toilet: with supervision;Ambulation;3-in-1 ADL Goal: Toilet Transfer - Progress: Goal set today Pt Will Perform Toileting - Clothing Manipulation: with supervision;Standing ADL Goal: Toileting - Clothing Manipulation - Progress: Goal set today Pt Will Perform Toileting - Hygiene: with supervision;Sitting on 3-in-1 or toilet ADL Goal: Toileting - Hygiene - Progress: Goal set today Pt Will Perform Tub/Shower Transfer: Shower transfer;with supervision ADL Goal: Web designer - Progress: Goal set today Additional ADL Goal #1: Pt will empty colostomy with S ADL Goal: Additional Goal #1 - Progress: Goal set today  OT Evaluation Precautions/Restrictions  Precautions Precautions: Fall Required Braces or Orthoses: Other Brace/Splint (pt w abdominal binder ) Restrictions Weight Bearing Restrictions: No Prior Functioning Home Living Lives With: Spouse Available Help at Discharge: Family Type of Home: House Home Access: Stairs to enter Secretary/administrator of Steps: 2 Entrance Stairs-Rails: None Home Layout: One level Bathroom Shower/Tub: Walk-in shower;Door Dentist: None Prior Function Level of Independence: Independent Able to Take Stairs?: Yes Driving: Yes Vocation: Retired  ADL ADL Eating/Feeding: Performed;Independent Where Assessed - Eating/Feeding: Chair Grooming: Performed;Wash/dry hands;Brushing hair;Set up Where Assessed - Grooming: Sitting, chair Upper Body Bathing: Simulated;Minimal assistance Upper Body Bathing Details (indicate cue type and reason): limited by fatigue to reach all areas. Where Assessed - Upper Body  Bathing: Sitting, chair Lower Body Bathing: Simulated;Maximal assistance Lower Body Bathing Details (indicate cue type and reason): pt with abdominal pain when moving legs up toward body.  pt unable to let go  of walker for long extended time to wash back side. Where Assessed - Lower Body Bathing: Sit to stand from chair Upper Body Dressing: Simulated;Minimal assistance Upper Body Dressing Details (indicate cue type and reason): min assist to gown around back. Where Assessed - Upper Body Dressing: Sitting, chair Lower Body Dressing: Simulated;Maximal assistance Lower Body Dressing Details (indicate cue type and reason): Pt may benefit from AE if pain does not subside in abdomen. Hard to dress LE secondary to inability to lean forward or bring legs toward stomach. Where Assessed - Lower Body Dressing: Sit to stand from chair Toilet Transfer: Performed;Minimal assistance Toilet Transfer Details (indicate cue type and reason): min assist to pivot secondary to fatigue. Toilet Transfer Method: Surveyor, minerals: Set designer - Clothing Manipulation: Simulated;Minimal assistance Toileting - Clothing Manipulation Details (indicate cue type and reason): difficult to let go of walker to pull pants up. Where Assessed - Toileting Clothing Manipulation: Standing Toileting - Hygiene: Simulated;Minimal assistance Toileting - Hygiene Details (indicate cue type and reason): with urinating pt is min assist.  Pt needs to be educated on how to change and empty colostomy bag. Where Assessed - Toileting Hygiene: Sit to stand from 3-in-1 or toilet Tub/Shower Transfer: Not assessed Tub/Shower Transfer Method: Not assessed Equipment Used: Rolling walker Ambulation Related to ADLs: Pt took 4 steps to chair with min guard.  Pt fatigues quickly. Vision/Perception  Vision - History Baseline Vision: No visual deficits Patient Visual Report: No change from baseline Vision -  Assessment Eye Alignment: Within Functional Limits Vision Assessment: Vision not tested Cognition Cognition Orientation Level: Oriented X4 Sensation/Coordination Coordination Gross Motor Movements are Fluid and Coordinated: Yes Fine Motor Movements are Fluid and Coordinated: Yes Extremity Assessment RUE Assessment RUE Assessment: Within Functional Limits LUE Assessment LUE Assessment: Within Functional Limits Mobility  Bed Mobility Bed Mobility: Yes Supine to Sit: 4: Min assist;HOB elevated (Comment degrees) (HOB at 30 degrees) Supine to Sit Details (indicate cue type and reason): assist coming into full sitting. Transfers Transfers: Yes Sit to Stand: 4: Min assist;With upper extremity assist;From bed Sit to Stand Details (indicate cue type and reason): min guard assist given. Stand to Sit: 4: Min assist;With upper extremity assist;With armrests;To chair/3-in-1 Stand to Sit Details: min guard and cues to reach back for chair. Exercises   End of Session OT - End of Session Activity Tolerance: Patient limited by fatigue Patient left: in chair;with call bell in reach Nurse Communication: Mobility status for transfers General Behavior During Session: [facility] for tasks performed Cognition: [facility] for tasks performed   Hope Budds [phone] 10/12/2011, 11:07 AM

## 2011-10-12 NOTE — Progress Notes (Signed)
She is up in chair when I came by and notes that her nausea seems to have relented and she is taking po's Her pain is unchanged. Overall she looks better. Her temp today seems to have trended down, but still not afebrile.  If she is still with fever tomorrow we will repeat scan and be sure she gets oral contrast

## 2011-10-12 NOTE — Consult Note (Signed)
WOC ostomy consult  Stoma type/location: end colostomy, LLQ Stomal assessment/size: 1 1/2"x 1 5/8" slightly oval, pink and moist Peristomal assessment: intact without problems Treatment options for stomal/peristomal skin: none needed Output thick, pasty brown Ostomy pouching: 1pc. Flat flexible used, cut in oval shape, pattern left in room and extra pouch at bedside.  .  Education provided: Pt would not look at stoma today, she wants to wait until teaching session early next wk with her husband. She is really nauseated and having quite a bit of pain.  I have explained while I changed pouch today that she will need to clean around the stoma only with water, she verbalized understanding.  Answered questions about her diet, she is concerned about constipation with her ostomy.  I have explained the ways to avoid this and we have discussed diet concerns she has.   WOC will follow along with you Kore Madlock Marlena Clipper, CWOCN [phone]

## 2011-10-12 NOTE — Progress Notes (Signed)
Pt's temp is 102.5 core, CCS on call MD notified, New order given per Dr. Gerrit Friends, see order management.

## 2011-10-12 NOTE — Progress Notes (Signed)
8 Days Post-Op  Subjective: No significant change.  She still c/o nausea, her temp still spikes up to 101 range.  She complains of constant pain.  Objective: Vital signs in last 24 hours: Temp:  [100.2 F (37.9 C)-101.7 F (38.7 C)] 100.8 F (38.2 C) (04/11 0430) Pulse Rate:  [93-120] 95  (04/11 0430) Resp:  [16-27] 16  (04/11 0739) BP: (93-115)/(44-61) 93/55 mmHg (04/11 0430) SpO2:  [96 %-98 %] 97 % (04/11 0739) Weight:  [62.7 kg (138 lb 3.7 oz)] 62.7 kg (138 lb 3.7 oz) (04/11 0600) Last BM Date: 10/09/11 Temp trending down but still going up to 101. No labs today.  CXR was OK yesterday, some atelectasis, small effusions Intake/Output from previous day: 04/10 0701 - 04/11 0700 In: 2804.4 [P.O.:600; I.V.:1657.4; IV Piggyback:67] Out: 1610 [RUEAV:4098; Stool:750] Intake/Output this shift:    General appearance: alert, cooperative and no distress Resp: clear to auscultation bilaterally and some decreased sounds at bases GI: soft, no distension, wound is ok Colostomy has gas and stool in it.  Lab Results:   Basename 10/11/11 0400 10/10/11 0500  WBC 11.9* 11.1*  HGB 10.2* 10.4*  HCT 30.3* 31.9*  PLT 287 234    BMET  Basename 10/11/11 0400 10/10/11 0500  NA 134* 133*  K 3.8 3.7  CL 100 101  CO2 28 27  GLUCOSE 128* 124*  BUN 5* 6  CREATININE 0.43* 0.47*  CALCIUM 8.2* 8.3*   PT/INR No results found for this basename: LABPROT:2,INR:2 in the last 72 hours   Lab 10/11/11 0400 10/09/11 0340  AST 42* 19  ALT 51* 19  ALKPHOS 70 83  BILITOT 0.2* 0.3  PROT 5.1* 5.0*  ALBUMIN 1.7* 1.8*     Lipase     Component Value Date/Time   LIPASE 20 10/04/2011 1345     Studies/Results: Dg Chest 2 View  10/11/2011  *RADIOLOGY REPORT*  Clinical Data: Fever, shortness of breath.  CHEST - 2 VIEW  Comparison: 10/08/2011  Findings: Slight elevation of the right hemidiaphragm, stable. Bibasilar atelectasis.  Small bilateral effusions.  Left PICC line is in place with the tip at the  cavoatrial junction.  IMPRESSION: Low lung volumes with bibasilar atelectasis and small effusions.  Left PICC line tip at the cavoatrial junction.  Original Report Authenticated By: Cyndie Chime, M.D.    Medications:    . antiseptic oral rinse  15 mL Mouth Rinse BID  . ertapenem (INVANZ) IV  1 g Intravenous Q24H  . fentaNYL   Intravenous Q4H  . heparin  5,000 Units Subcutaneous Q8H  . metoCLOPramide (REGLAN) injection  5 mg Intravenous TID AC & HS  . pantoprazole (PROTONIX) IV  40 mg Intravenous Q24H  . 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 Derrell Lolling  Post op fever/tachycardia/hypotension  Post op nausea, pain and fever CT scan 10/09/11: ascites, mesenteric edema, pleural effusions, no abscess noted.  Migraine  Hypertension  CHRONIC BACK PAIN she uses hydrocodone almost daily  HISTORY OF TOBACCO USE/NONE FOR 13 YEARS   Plan:  Continue to mobilize, add po pain meds so we can try something other than IV PCA, We will most likely repeat CT tomorrow      LOS: 8 days    Treyten Monestime 10/12/2011

## 2011-10-13 ENCOUNTER — Inpatient Hospital Stay (HOSPITAL_COMMUNITY)

## 2011-10-13 DIAGNOSIS — G8929 Other chronic pain: Secondary | ICD-10-CM | POA: Diagnosis present

## 2011-10-13 DIAGNOSIS — R5082 Postprocedural fever: Secondary | ICD-10-CM | POA: Diagnosis present

## 2011-10-13 DIAGNOSIS — G43909 Migraine, unspecified, not intractable, without status migrainosus: Secondary | ICD-10-CM | POA: Diagnosis present

## 2011-10-13 DIAGNOSIS — I10 Essential (primary) hypertension: Secondary | ICD-10-CM | POA: Diagnosis present

## 2011-10-13 DIAGNOSIS — Z720 Tobacco use: Secondary | ICD-10-CM | POA: Diagnosis present

## 2011-10-13 LAB — GLUCOSE, CAPILLARY
Glucose-Capillary: 109 mg/dL — ABNORMAL HIGH (ref 70–99)
Glucose-Capillary: 101 mg/dL — ABNORMAL HIGH (ref 70–99)
Glucose-Capillary: 116 mg/dL — ABNORMAL HIGH (ref 70–99)
Glucose-Capillary: 119 mg/dL — ABNORMAL HIGH (ref 70–99)

## 2011-10-13 LAB — CBC
HCT: 29.9 % — ABNORMAL LOW (ref 36.0–46.0)
Hemoglobin: 9.9 g/dL — ABNORMAL LOW (ref 12.0–15.0)
MCH: 29.6 pg (ref 26.0–34.0)
MCHC: 33.1 g/dL (ref 30.0–36.0)
MCV: 89.3 fL (ref 78.0–100.0)
Platelets: 399 10*3/uL (ref 150–400)
RBC: 3.35 MIL/uL — ABNORMAL LOW (ref 3.87–5.11)
RDW: 13.4 % (ref 11.5–15.5)
WBC: 11.5 10*3/uL — ABNORMAL HIGH (ref 4.0–10.5)

## 2011-10-13 LAB — COMPREHENSIVE METABOLIC PANEL
ALT: 68 U/L — ABNORMAL HIGH (ref 0–35)
AST: 36 U/L (ref 0–37)
Albumin: 1.7 g/dL — ABNORMAL LOW (ref 3.5–5.2)
Alkaline Phosphatase: 73 U/L (ref 39–117)
BUN: 3 mg/dL — ABNORMAL LOW (ref 6–23)
CO2: 28 mEq/L (ref 19–32)
Calcium: 8.3 mg/dL — ABNORMAL LOW (ref 8.4–10.5)
Chloride: 98 mEq/L (ref 96–112)
Creatinine, Ser: 0.46 mg/dL — ABNORMAL LOW (ref 0.50–1.10)
GFR calc Af Amer: >90 mL/min (ref >90)
GFR calc non Af Amer: >90 mL/min (ref >90)
Glucose, Bld: 116 mg/dL — ABNORMAL HIGH (ref 70–99)
Potassium: 3.8 mEq/L (ref 3.5–5.1)
Sodium: 134 mEq/L — ABNORMAL LOW (ref 135–145)
Total Bilirubin: 0.2 mg/dL — ABNORMAL LOW (ref 0.3–1.2)
Total Protein: 5.5 g/dL — ABNORMAL LOW (ref 6.0–8.3)

## 2011-10-13 LAB — MAGNESIUM: Magnesium: 1.7 mg/dL (ref 1.5–2.5)

## 2011-10-13 LAB — CLOSTRIDIUM DIFFICILE BY PCR: Toxigenic C. Difficile by PCR: NEGATIVE

## 2011-10-13 MED ORDER — CLINIMIX E/DEXTROSE (5/15) 5 % IV SOLN
INTRAVENOUS | Status: AC
  Administered 2011-10-14: 18:00:00 via INTRAVENOUS
  Filled 2011-10-13: qty 1000

## 2011-10-13 MED ORDER — FAT EMULSION 20 % IV EMUL
240.0000 mL | INTRAVENOUS | Status: AC
  Administered 2011-10-14: 240 mL via INTRAVENOUS
  Filled 2011-10-13: qty 250

## 2011-10-13 MED ORDER — POTASSIUM CHLORIDE 2 MEQ/ML IV SOLN
INTRAVENOUS | Status: AC
  Filled 2011-10-13 (×2): qty 1000

## 2011-10-13 MED ORDER — INSULIN ASPART 100 UNIT/ML ~~LOC~~ SOLN
0.0000 [IU] | Freq: Four times a day (QID) | SUBCUTANEOUS | Status: DC
  Administered 2011-10-14 – 2011-10-16 (×7): 1 [IU] via SUBCUTANEOUS

## 2011-10-13 NOTE — Progress Notes (Signed)
As per note of WJ,PA. She has taken some po, but still mild nausea and the same abdominal pain. Still with lots out ostomy. CT reviewed and no clear abscess. Question of thickened colon, infection. Will start TNA as she is without nuitrition several days and doesn't appear she will progress much. Will also check c diff.

## 2011-10-13 NOTE — Progress Notes (Signed)
Physical Therapy Treatment Patient Details Name: Stacy Dalton MRN: [id number] DOB: 03-02-1951 Today's Date: 10/13/2011  11:35-12:12 2 ta, 1 gt  PT Assessment/Plan  PT - Assessment/Plan Comments on Treatment Session: Pt. +2 total assist due to IV/O2 tank for [name]/chair, foley catheter, colostomy bag.  Pt on 2L [name], VS supine 104/77 (83), HR 106, 96% O2 sats; sitting at EOB 120/58 (73), HR 109, 96% O2 sats; standing 103/56, HR 113, 100 O2 sats.  Pt. c/o pain 8/10 prior to tx, but able to go supine -->sit with min guard assist, and sit-->stand min guard assist with HOB raised.  Abdominal brace in place, pt s/p Exp lap.  Pt able to use PCA once standing, but c/o abdominal pain 9/10 during ambulation with RW for 60 ft. Pt required increased time and positive reinforcement 2nd anxiety. PT Goals  Acute Rehab PT Goals PT Goal Formulation: With patient Pt will go Supine/Side to Sit: with modified independence PT Goal: Supine/Side to Sit - Progress: Progressing toward goal Pt will go Sit to Stand: with modified independence PT Goal: Sit to Stand - Progress: Progressing toward goal Pt will go Stand to Sit: with modified independence PT Goal: Stand to Sit - Progress: Progressing toward goal Pt will Ambulate: >150 feet;with modified independence;with least restrictive assistive device PT Goal: Ambulate - Progress: Progressing toward goal  PT Treatment Precautions/Restrictions  Precautions Precautions: Fall Required Braces or Orthoses: Other Brace/Splint (pt w abdominal binder ) Restrictions Weight Bearing Restrictions: No Mobility (including Balance) Bed Mobility Bed Mobility: Yes Supine to Sit:  (min guard assist) Supine to Sit Details (indicate cue type and reason): HOB elevated, pt able move LE and come to sit without assistance with c/o pain 8/10 Transfers Transfers: Yes Sit to Stand: 1: +2 Total assist;From bed Sit to Stand Details (indicate cue type and reason): +2 total assist pt 95%  due to iv/O2 tank/RW and chair Stand to Sit: 1: +2 Total assist;To chair/3-in-1 Stand to Sit Details: +2 total assist pt=95% Ambulation/Gait Ambulation/Gait: Yes Ambulation/Gait Assistance: 1: +2 Total assist Ambulation/Gait Assistance Details (indicate cue type and reason): +2 total assist pt=95% assist needed for equipment Ambulation Distance (Feet): 60 Feet Assistive device: Rolling walker Gait Pattern: Step-through pattern;Decreased stride length Gait velocity: decreased due to pain from surgery Stairs: No Wheelchair Mobility Wheelchair Mobility: No    Exercise    End of Session PT - End of Session Equipment Utilized During Treatment: Gait belt Activity Tolerance: Patient limited by pain Patient left: in chair;with call bell in reach;with family/visitor present General Behavior During Session: [facility] for tasks performed Cognition: [facility] for tasks performed  [name], [name] SPTA 10/13/2011, 1:59 PM  [name]  PTA [facility]  Acute  Rehab Pager     [phone]

## 2011-10-13 NOTE — Progress Notes (Addendum)
PARENTERAL NUTRITION CONSULT NOTE - INITIAL  Pharmacy Consult for TNA Indication: Prolonged Ileus  Allergies  Allergen Reactions  . Demerol   . Esgic (Butalbital-Apap-Caffeine)   . Iodine Other (See Comments)    bp bottomed out per pt several hours later; unsure if pre medicated in past with cm; done in [place]    Patient Measurements: Height: 5\' 2"  (157.5 cm) Weight: 138 lb 3.7 oz (62.7 kg) IBW/kg (Calculated) : 50.1  Adjusted Body Weight: 55.1 kg  Vital Signs: Temp: 101.7 F (38.7 C) (04/12 1155) BP: 103/56 mmHg (04/12 1155) Pulse Rate: 111  (04/12 1155) Intake/Output from previous day: 04/11 0701 - 04/12 0700 In: 2282.2 [P.O.:480; I.V.:1729.2; IV Piggyback:13] Out: 2805 [Urine:2580; Stool:225] Intake/Output from this shift: Total I/O In: 375 [I.V.:375] Out: 1275 [Urine:875; Stool:400]  Labs:  Basename 10/13/11 0500 10/11/11 0400  WBC 11.5* 11.9*  HGB 9.9* 10.2*  HCT 29.9* 30.3*  PLT 399 287  APTT -- --  INR -- --     Basename 10/13/11 0500 10/11/11 0400  NA 134* 134*  K 3.8 3.8  CL 98 100  CO2 28 28  GLUCOSE 116* 128*  BUN 3* 5*  CREATININE 0.46* 0.43*  LABCREA -- --  CREAT24HRUR -- --  CALCIUM 8.3* 8.2*  MG 1.7 1.7  PHOS -- --  PROT 5.5* 5.1*  ALBUMIN 1.7* 1.7*  AST 36 42*  ALT 68* 51*  ALKPHOS 73 70  BILITOT 0.2* 0.2*  BILIDIR -- --  IBILI -- --  PREALBUMIN -- --  TRIG -- --  CHOLHDL -- --  CHOL -- --   Estimated Creatinine Clearance: 62.6 ml/min (by C-G formula based on Cr of 0.46).    Basename 10/13/11 1152 10/13/11 0737 10/12/11 2121  GLUCAP 101* 109* 125*    Medical History: Past Medical History  Diagnosis Date  . Migraine   . Hypertension     Insulin Requirements in the past 24 hours:  none  Current Nutrition:  Bariatric full liquid  Assessment:  61yof admitted with perforated viscus s/p exploratory lap, left colectomy with colostomy, and lavage on 10/04/11.  Will begin TNA for prolonged ileus on 4/13.    Colostomy  output = 850 mL so far today.  Patient has had PICC line placed since 4/8.  Albumin 1.7  ALT slightly elevated  Other lytes WNL, including Mag/K+ but no recent phosphorus level - will order level for AM before starting TNA.  Nutritional Goals:  1488 kCal, 75 grams of protein per day  Plan:  At 1800 tomorrow:  Start Clinimix E 5/15 at 40 ml/hr  Fat emulsion at 10 ml/hr  Plan to advance as tolerated to a goal rate of 62 ml/hr + 10 ml/hr to provide: 74 g/day protein and 1538 Kcal/day.  TNA to contain standard multivitamins and trace elements(MWF only due to ongoing shortage).  Reduce IVF to 35 ml/hr.  Add sensitive SSI q4h.  TNA lab panels on Mondays & Thursdays.  F/u daily.  Clance Boll 10/13/2011,4:32 PM    Addendum: Assessment  Na slightly low (can't adjust in premixed TNA), other electrolytes wnl.    Album is low at 1.8 (4/13), ALT is elevated but has improved, AST wnl.  Prealbumin is pending (4/13)  Cdiff PRC negative (4/12) Plan:  At 1800, start TNA as ordered above  Lynann Beaver PharmD, BCPS Pager [phone] 10/14/2011 7:23 AM

## 2011-10-13 NOTE — Progress Notes (Signed)
9 Days Post-Op  Subjective: Again no change, she complains of pain and nausea.  Nausea better with meds.  Still using PCA frequently  Objective: Vital signs in last 24 hours: Temp:  [99.7 F (37.6 C)-101.8 F (38.8 C)] 99.7 F (37.6 C) (04/11 2200) Pulse Rate:  [89-107] 89  (04/11 2200) Resp:  [14-19] 14  (04/11 2200) BP: (93-114)/(45-62) 99/51 mmHg (04/12 0400) SpO2:  [95 %-100 %] 95 % (04/11 2200) Last BM Date: 10/12/11 TM 101.8 3PM yesterday, BP in 90's, diet, Bariatric full, WBC still just above11K Intake/Output from previous day: 04/11 0701 - 04/12 0700 In: 2207.2 [P.O.:480; I.V.:1654.2; IV Piggyback:13] Out: 2685 [Urine:2460; Stool:225] Intake/Output this shift:    General appearance: alert, cooperative and no distress Resp: clear to auscultation bilaterally and bs down in bases GI: soft, non-tender; bowel sounds normal; no masses,  no organomegaly and colostomy looks good, stool and gas in pouch.  Lab Results:   Basename 10/13/11 0500 10/11/11 0400  WBC 11.5* 11.9*  HGB 9.9* 10.2*  HCT 29.9* 30.3*  PLT 399 287    BMET  Basename 10/13/11 0500 10/11/11 0400  NA 134* 134*  K 3.8 3.8  CL 98 100  CO2 28 28  GLUCOSE 116* 128*  BUN 3* 5*  CREATININE 0.46* 0.43*  CALCIUM 8.3* 8.2*   PT/INR No results found for this basename: LABPROT:2,INR:2 in the last 72 hours   Lab 10/13/11 0500 10/11/11 0400 10/09/11 0340  AST 36 42* 19  ALT 68* 51* 19  ALKPHOS 73 70 83  BILITOT 0.2* 0.2* 0.3  PROT 5.5* 5.1* 5.0*  ALBUMIN 1.7* 1.7* 1.8*     Lipase     Component Value Date/Time   LIPASE 20 10/04/2011 1345     Studies/Results: Dg Chest 2 View  10/11/2011  *RADIOLOGY REPORT*  Clinical Data: Fever, shortness of breath.  CHEST - 2 VIEW  Comparison: 10/08/2011  Findings: Slight elevation of the right hemidiaphragm, stable. Bibasilar atelectasis.  Small bilateral effusions.  Left PICC line is in place with the tip at the cavoatrial junction.  IMPRESSION: Low lung volumes  with bibasilar atelectasis and small effusions.  Left PICC line tip at the cavoatrial junction.  Original Report Authenticated By: Cyndie Chime, M.D.    Medications:    . antiseptic oral rinse  15 mL Mouth Rinse BID  . ertapenem (INVANZ) IV  1 g Intravenous Q24H  . fentaNYL   Intravenous Q4H  . heparin  5,000 Units Subcutaneous Q8H  . metoCLOPramide (REGLAN) injection  5 mg Intravenous TID AC & HS  . pantoprazole (PROTONIX) IV  40 mg Intravenous Q12H  . sodium chloride  10-40 mL Intracatheter Q12H  . 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 Derrell Lolling  Post op fever/tachycardia/hypotension  Post op nausea, pain and fever  CT scan 10/09/11: ascites, mesenteric edema, pleural effusions, no abscess noted.  Migraine  Hypertension  CHRONIC BACK PAIN she uses hydrocodone almost daily  HISTORY OF TOBACCO USE/NONE FOR 13 YEARS     Repeat CT, check on transferring to floor.She's on Bariatric full liquids.   LOS: 9 days    Stacy Dalton 10/13/2011

## 2011-10-14 LAB — TRIGLYCERIDES: Triglycerides: 177 mg/dL — ABNORMAL HIGH (ref <150)

## 2011-10-14 LAB — COMPREHENSIVE METABOLIC PANEL
ALT: 53 U/L — ABNORMAL HIGH (ref 0–35)
AST: 25 U/L (ref 0–37)
Albumin: 1.8 g/dL — ABNORMAL LOW (ref 3.5–5.2)
Alkaline Phosphatase: 75 U/L (ref 39–117)
BUN: 3 mg/dL — ABNORMAL LOW (ref 6–23)
CO2: 29 mEq/L (ref 19–32)
Calcium: 8.5 mg/dL (ref 8.4–10.5)
Chloride: 97 mEq/L (ref 96–112)
Creatinine, Ser: 0.47 mg/dL — ABNORMAL LOW (ref 0.50–1.10)
GFR calc Af Amer: >90 mL/min (ref >90)
GFR calc non Af Amer: >90 mL/min (ref >90)
Glucose, Bld: 123 mg/dL — ABNORMAL HIGH (ref 70–99)
Potassium: 4.1 mEq/L (ref 3.5–5.1)
Sodium: 134 mEq/L — ABNORMAL LOW (ref 135–145)
Total Bilirubin: 0.2 mg/dL — ABNORMAL LOW (ref 0.3–1.2)
Total Protein: 5.7 g/dL — ABNORMAL LOW (ref 6.0–8.3)

## 2011-10-14 LAB — DIFFERENTIAL
Basophils Absolute: 0 10*3/uL (ref 0.0–0.1)
Basophils Relative: 0 % (ref 0–1)
Eosinophils Absolute: 0.2 10*3/uL (ref 0.0–0.7)
Eosinophils Relative: 2 % (ref 0–5)
Lymphocytes Relative: 12 % (ref 12–46)
Lymphs Abs: 1.4 10*3/uL (ref 0.7–4.0)
Monocytes Absolute: 1.4 10*3/uL — ABNORMAL HIGH (ref 0.1–1.0)
Monocytes Relative: 13 % — ABNORMAL HIGH (ref 3–12)
Neutro Abs: 8.2 10*3/uL — ABNORMAL HIGH (ref 1.7–7.7)
Neutrophils Relative %: 73 % (ref 43–77)

## 2011-10-14 LAB — GLUCOSE, CAPILLARY
Glucose-Capillary: 112 mg/dL — ABNORMAL HIGH (ref 70–99)
Glucose-Capillary: 101 mg/dL — ABNORMAL HIGH (ref 70–99)
Glucose-Capillary: 136 mg/dL — ABNORMAL HIGH (ref 70–99)

## 2011-10-14 LAB — PREALBUMIN: Prealbumin: 4 mg/dL — ABNORMAL LOW (ref 17.0–34.0)

## 2011-10-14 LAB — CBC
HCT: 28.8 % — ABNORMAL LOW (ref 36.0–46.0)
Hemoglobin: 9.5 g/dL — ABNORMAL LOW (ref 12.0–15.0)
MCH: 29.4 pg (ref 26.0–34.0)
MCHC: 33 g/dL (ref 30.0–36.0)
MCV: 89.2 fL (ref 78.0–100.0)
Platelets: 477 10*3/uL — ABNORMAL HIGH (ref 150–400)
RBC: 3.23 MIL/uL — ABNORMAL LOW (ref 3.87–5.11)
RDW: 13.2 % (ref 11.5–15.5)
WBC: 11.3 10*3/uL — ABNORMAL HIGH (ref 4.0–10.5)

## 2011-10-14 LAB — PHOSPHORUS: Phosphorus: 3.4 mg/dL (ref 2.3–4.6)

## 2011-10-14 LAB — MAGNESIUM: Magnesium: 1.8 mg/dL (ref 1.5–2.5)

## 2011-10-14 LAB — CHOLESTEROL, TOTAL: Cholesterol: 106 mg/dL (ref 0–200)

## 2011-10-14 NOTE — Progress Notes (Signed)
10 Days Post-Op  Subjective: Feels about the same. Would like to eat more. Still having BM's. Slept better with ultram last night. Mild nausea persists  Objective: Vital signs in last 24 hours: Temp:  [98.8 F (37.1 C)-102.7 F (39.3 C)] 100.4 F (38 C) (04/13 0400) Pulse Rate:  [85-114] 94  (04/13 0400) Resp:  [16-26] 21  (04/13 0400) BP: (89-120)/(41-77) 105/54 mmHg (04/13 0400) SpO2:  [39 %-100 %] 100 % (04/13 0400)   Intake/Output from previous day: 04/12 0701 - 04/13 0700 In: 1760 [I.V.:1650; IV Piggyback:110] Out: 3775 [Urine:2825; Stool:950] Intake/Output this shift:     General appearance: alert, cooperative and mild distress Resp: clear to auscultation bilaterally GI: Abdomen remains soft and not tender. Ostomy funtioning.   Incision: clean and without evidence of infection  Lab Results:   Basename 10/14/11 0510 10/13/11 0500  WBC 11.3* 11.5*  HGB 9.5* 9.9*  HCT 28.8* 29.9*  PLT 477* 399   BMET  Basename 10/14/11 0510 10/13/11 0500  NA 134* 134*  K 4.1 3.8  CL 97 98  CO2 29 28  GLUCOSE 123* 116*  BUN 3* 3*  CREATININE 0.47* 0.46*  CALCIUM 8.5 8.3*   PT/INR No results found for this basename: LABPROT:2,INR:2 in the last 72 hours ABG No results found for this basename: PHART:2,PCO2:2,PO2:2,HCO3:2 in the last 72 hours  MEDS, Scheduled    . antiseptic oral rinse  15 mL Mouth Rinse BID  . ertapenem (INVANZ) IV  1 g Intravenous Q24H  . fentaNYL   Intravenous Q4H  . heparin  5,000 Units Subcutaneous Q8H  . insulin aspart  0-9 Units Subcutaneous Q6H  . metoCLOPramide (REGLAN) injection  5 mg Intravenous TID AC & HS  . pantoprazole (PROTONIX) IV  40 mg Intravenous Q12H  . sodium chloride  10-40 mL Intracatheter Q12H    Studies/Results: Ct Abdomen Pelvis Wo Contrast  10/13/2011  *RADIOLOGY REPORT*  Clinical Data:  Evaluate for abscess.  Colon resection from perforation 1 week ago.  Peritonitis.  CT ABDOMEN AND PELVIS WITHOUT CONTRAST  Technique:  Multidetector CT imaging of the abdomen and pelvis was performed following the standard protocol with oral but without intravenous contrast.  Comparison: 10/09/2011 and 10/04/2011  Findings:  3 mm right middle lobe lung nodule on image 3.  Mild left base atelectasis.  Right base collapse / consolidative change.  Normal heart size without pericardial effusion.  Small right greater than left bilateral pleural effusions are similar.  Exam is again degraded by lack of IV contrast.  Normal uninfused appearance of the liver, spleen.  The gastric antrum versus pylorus is underdistended, image 35.  Normal pancreas.  Gallbladder is partially collapsed.  There is a small amount of fluid adjacent the gallbladder, nonspecific in the setting of ascites.  Example image 37.  Similar.  Normal adrenal glands and kidneys.  No retroperitoneal or retrocrural adenopathy.  Similar small volume perihepatic and perisplenic ascites.  Long Hartmann's pouch and descending colostomy.  There is wall thickening of the transverse colon.  This is superimposed upon underdistension, including on image 35.  Normal terminal ileum. No pneumatosis or free intraperitoneal air.  Redemonstration of extensive mesenteric edema.  No small bowel obstruction.  There are small bowel loops within the pelvis which are likely also thickened and underdistended.  Example image 77. Small volume of simple appearing pelvic fluid.  No loculated ascites to suggest abscess.  No pelvic adenopathy.  Foley catheter within urinary bladder. Hysterectomy.  No contrast extravasation.  Hepatomegaly, 22.0  cm cranial caudal on coronal reformatted images.  IMPRESSION:  1.  New transverse colonic wall thickening, suspicious for infectious colitis.  Rule out C difficile clinically. 2.  More subtle/mild small bowel wall thickening within the pelvis, suspicious for enteritis, superimposed upon underdistension. Likely also infectious. 3.  No evidence of abscess.  Similar small moderate  volume abdominal pelvic ascites, without loculation.  Decrease sensitivity for abscess or peritoneal thickening secondary lack of IV contrast. 4.  Similar lung bases with right base atelectasis versus infection and right greater than left small bilateral pleural effusions.  Original Report Authenticated By: Consuello Bossier, M.D.    Assessment: s/p Procedure(s): EXPLORATORY LAPAROTOMY Persistent fever of uncertain etiology Malnuition - 10+ days without significant caloric intake C Diff was negaitve  Plan: Will see if she can take some solid foods. To start TNA as I'm not sure she will be able to eat enough. Will check urine C&S for source for fever. IMay need to change PICC if no other source, but I suspect this is from abdomen   LOS: 10 days     Currie Paris, MD, [facility], [place] [phone]   10/14/2011 7:27 AM

## 2011-10-15 DIAGNOSIS — K650 Generalized (acute) peritonitis: Secondary | ICD-10-CM

## 2011-10-15 DIAGNOSIS — D649 Anemia, unspecified: Secondary | ICD-10-CM | POA: Diagnosis present

## 2011-10-15 LAB — CBC
HCT: 29 % — ABNORMAL LOW (ref 36.0–46.0)
Hemoglobin: 9.5 g/dL — ABNORMAL LOW (ref 12.0–15.0)
MCH: 29.4 pg (ref 26.0–34.0)
MCHC: 32.8 g/dL (ref 30.0–36.0)
MCV: 89.8 fL (ref 78.0–100.0)
Platelets: 546 10*3/uL — ABNORMAL HIGH (ref 150–400)
RBC: 3.23 MIL/uL — ABNORMAL LOW (ref 3.87–5.11)
RDW: 13.2 % (ref 11.5–15.5)
WBC: 12.5 10*3/uL — ABNORMAL HIGH (ref 4.0–10.5)

## 2011-10-15 LAB — GLUCOSE, CAPILLARY
Glucose-Capillary: 128 mg/dL — ABNORMAL HIGH (ref 70–99)
Glucose-Capillary: 131 mg/dL — ABNORMAL HIGH (ref 70–99)
Glucose-Capillary: 133 mg/dL — ABNORMAL HIGH (ref 70–99)
Glucose-Capillary: 134 mg/dL — ABNORMAL HIGH (ref 70–99)

## 2011-10-15 LAB — URINE CULTURE
Colony Count: NO GROWTH
Culture  Setup Time: 201304131336
Culture: NO GROWTH

## 2011-10-15 MED ORDER — METRONIDAZOLE IN NACL 5-0.79 MG/ML-% IV SOLN
500.0000 mg | Freq: Three times a day (TID) | INTRAVENOUS | Status: DC
  Administered 2011-10-15 – 2011-10-23 (×24): 500 mg via INTRAVENOUS
  Filled 2011-10-15 (×29): qty 100

## 2011-10-15 MED ORDER — POTASSIUM CHLORIDE 2 MEQ/ML IV SOLN
INTRAVENOUS | Status: DC
  Administered 2011-10-16 – 2011-10-19 (×4): via INTRAVENOUS
  Filled 2011-10-15 (×6): qty 1000

## 2011-10-15 MED ORDER — DEXTROSE 5 % IV SOLN
2.0000 g | Freq: Two times a day (BID) | INTRAVENOUS | Status: DC
  Administered 2011-10-15 – 2011-10-23 (×16): 2 g via INTRAVENOUS
  Filled 2011-10-15 (×18): qty 2

## 2011-10-15 MED ORDER — KCL IN DEXTROSE-NACL 40-5-0.45 MEQ/L-%-% IV SOLN: INTRAVENOUS | Status: DC

## 2011-10-15 MED ORDER — FAT EMULSION 20 % IV EMUL
240.0000 mL | INTRAVENOUS | Status: DC
  Administered 2011-10-15: 240 mL via INTRAVENOUS
  Filled 2011-10-15: qty 250

## 2011-10-15 MED ORDER — CLINIMIX E/DEXTROSE (5/15) 5 % IV SOLN
INTRAVENOUS | Status: DC
  Administered 2011-10-15: 17:00:00 via INTRAVENOUS
  Filled 2011-10-15: qty 1440

## 2011-10-15 NOTE — Progress Notes (Signed)
11 Days Post-Op  Subjective: Actually ate some solid yesterday. Intermittent mild nausea and intermittent abd pain, more crampy. Ostomy functioning and no other new issues  Objective: Vital signs in last 24 hours: Temp:  [99.1 F (37.3 C)-102 F (38.9 C)] 101.1 F (38.4 C) (04/14 0300) Pulse Rate:  [83-105] 100  (04/14 0300) Resp:  [16-21] 19  (04/14 0300) BP: (95-117)/(52-68) 112/52 mmHg (04/14 0310) SpO2:  [96 %-100 %] 96 % (04/14 0300) Weight:  [132 lb 11.5 oz (60.2 kg)] 132 lb 11.5 oz (60.2 kg) (04/14 0000)   Intake/Output from previous day: 04/13 0701 - 04/14 0700 In: 2120.5 [P.O.:480; I.V.:1019.8; IV Piggyback:60; TPN:560.7] Out: 2925 [Urine:2825; Stool:100] Intake/Output this shift:     General appearance: alert, fatigued and mild distress Resp: clear to auscultation bilaterally GI: Abdomen is still soft, mildly tender but no gurading or rebound. BS+_  Incision: healing well  Lab Results:   Basename 10/15/11 0430 10/14/11 0510  WBC 12.5* 11.3*  HGB 9.5* 9.5*  HCT 29.0* 28.8*  PLT 546* 477*   BMET  Basename 10/14/11 0510 10/13/11 0500  NA 134* 134*  K 4.1 3.8  CL 97 98  CO2 29 28  GLUCOSE 123* 116*  BUN 3* 3*  CREATININE 0.47* 0.46*  CALCIUM 8.5 8.3*   PT/INR No results found for this basename: LABPROT:2,INR:2 in the last 72 hours ABG No results found for this basename: PHART:2,PCO2:2,PO2:2,HCO3:2 in the last 72 hours  MEDS, Scheduled    . antiseptic oral rinse  15 mL Mouth Rinse BID  . ertapenem (INVANZ) IV  1 g Intravenous Q24H  . fentaNYL   Intravenous Q4H  . heparin  5,000 Units Subcutaneous Q8H  . insulin aspart  0-9 Units Subcutaneous Q6H  . pantoprazole (PROTONIX) IV  40 mg Intravenous Q12H  . sodium chloride  10-40 mL Intracatheter Q12H    Studies/Results: Ct Abdomen Pelvis Wo Contrast  10/13/2011  *RADIOLOGY REPORT*  Clinical Data:  Evaluate for abscess.  Colon resection from perforation 1 week ago.  Peritonitis.  CT ABDOMEN AND  PELVIS WITHOUT CONTRAST  Technique: Multidetector CT imaging of the abdomen and pelvis was performed following the standard protocol with oral but without intravenous contrast.  Comparison: 10/09/2011 and 10/04/2011  Findings:  3 mm right middle lobe lung nodule on image 3.  Mild left base atelectasis.  Right base collapse / consolidative change.  Normal heart size without pericardial effusion.  Small right greater than left bilateral pleural effusions are similar.  Exam is again degraded by lack of IV contrast.  Normal uninfused appearance of the liver, spleen.  The gastric antrum versus pylorus is underdistended, image 35.  Normal pancreas.  Gallbladder is partially collapsed.  There is a small amount of fluid adjacent the gallbladder, nonspecific in the setting of ascites.  Example image 37.  Similar.  Normal adrenal glands and kidneys.  No retroperitoneal or retrocrural adenopathy.  Similar small volume perihepatic and perisplenic ascites.  Long Hartmann's pouch and descending colostomy.  There is wall thickening of the transverse colon.  This is superimposed upon underdistension, including on image 35.  Normal terminal ileum. No pneumatosis or free intraperitoneal air.  Redemonstration of extensive mesenteric edema.  No small bowel obstruction.  There are small bowel loops within the pelvis which are likely also thickened and underdistended.  Example image 77. Small volume of simple appearing pelvic fluid.  No loculated ascites to suggest abscess.  No pelvic adenopathy.  Foley catheter within urinary bladder. Hysterectomy.  No contrast  extravasation.  Hepatomegaly, 22.0 cm cranial caudal on coronal reformatted images.  IMPRESSION:  1.  New transverse colonic wall thickening, suspicious for infectious colitis.  Rule out C difficile clinically. 2.  More subtle/mild small bowel wall thickening within the pelvis, suspicious for enteritis, superimposed upon underdistension. Likely also infectious. 3.  No evidence of  abscess.  Similar small moderate volume abdominal pelvic ascites, without loculation.  Decrease sensitivity for abscess or peritoneal thickening secondary lack of IV contrast. 4.  Similar lung bases with right base atelectasis versus infection and right greater than left small bilateral pleural effusions.  Original Report Authenticated By: Consuello Bossier, M.D.    Assessment: s/p Procedure(s): EXPLORATORY LAPAROTOMY Persistent fever of uncertain etiology, likely source somewhere in abdomen, but not apparent on CT as yet.  Malnutrition, now on TNA  Plan: Continue current antibiotics. Will continue TNA till sure she is eating enough. Will ask for ID consult. May yet need a second exp lap.  LOS: 11 days     Currie Paris, MD, [facility], [place] [phone]   10/15/2011 7:28 AM

## 2011-10-15 NOTE — Progress Notes (Signed)
INITIAL ADULT NUTRITION ASSESSMENT Date: 10/15/2011   Time: 11:28 AM  Reason for Assessment: New TPN/Consult  ASSESSMENT: Female 61 y.o.  Dx: Perforated bowel  Hx:  Past Medical History  Diagnosis Date  . Migraine   . Hypertension     Related Meds:    . insulin aspart  0-9 Units Subcutaneous Q6H  . pantoprazole (PROTONIX) IV  40 mg Intravenous Q12H   Ht: 5\' 2"  (157.5 cm)  Wt: 132 lb 11.5 oz (60.2 kg)  Ideal Wt: 50.1 kg  % Ideal Wt: 120%  Usual Wt: Unknown  % Usual Wt: Unknown  Body mass index is 24.27 kg/(m^2).  Food/Nutrition Related Hx: Patient admitted with perforated viscus s/p exploratory lap with left colectomy and colostomy. Colostomy output 950 ml yesterday. Patient has been 10+ days without nutrition. TPN started yesterday. Clinimix 5/15 at 40 ml/hr + lipids at 10 ml/hr. She is tolerating well. Electrolytes WNL. Will increase to goal rate of 60 ml/hr + lipids at 10 ml/hr tonight. This will provide 1502 kcal and 72 g protein, which will meet 100% of estimated nutrition needs.   Diet advanced to Regular yesterday. Patient reports eating some last night, but not much of breakfast today. She still c/o some nausea and abdominal pain, which has improved over hospital stay.   Labs:  CMP     Component Value Date/Time   NA 134* 10/14/2011 0510   K 4.1 10/14/2011 0510   CL 97 10/14/2011 0510   CO2 29 10/14/2011 0510   GLUCOSE 123* 10/14/2011 0510   BUN 3* 10/14/2011 0510   CREATININE 0.47* 10/14/2011 0510   CALCIUM 8.5 10/14/2011 0510   PROT 5.7* 10/14/2011 0510   ALBUMIN 1.8* 10/14/2011 0510   AST 25 10/14/2011 0510   ALT 53* 10/14/2011 0510   ALKPHOS 75 10/14/2011 0510   BILITOT 0.2* 10/14/2011 0510   GFRNONAA >90 10/14/2011 0510   GFRAA >90 10/14/2011 0510   PREALB 4.0 L   Intake/Output Summary (Last 24 hours) at 10/15/11 1133 Last data filed at 10/15/11 1100  Gross per 24 hour  Intake 2125.5 ml  Output   2975 ml  Net -849.5 ml    Diet Order:  Regular  Supplements/Tube Feeding: None  IVF:    dextrose 5 % and 0.45% NaCl 1,000 mL with potassium chloride 40 mEq infusion Last Rate: 75 mL/hr at 10/14/11 1346  dextrose 5 % and 0.45% NaCl 1,000 mL with potassium chloride 40 mEq infusion Last Rate: 35 mL/hr at 10/15/11 0000  dextrose 5 % and 0.45% NaCl 1,000 mL with potassium chloride 40 mEq infusion   TPN (CLINIMIX) +/- additives Last Rate: 40 mL/hr at 10/14/11 1747  fat emulsion Last Rate: 240 mL (10/14/11 1748)  TPN (CLINIMIX) +/- additives   fat emulsion     Estimated Nutritional Needs:   Kcal:1450-1550 kcal Protein:72-85 g Fluid:1.8 L  NUTRITION DIAGNOSIS: -Inadequate oral intake (NI-2.1).  Status: Ongoing  RELATED TO: perforated bowel with persistent nausea and abdominal pain  AS EVIDENCE BY: Minimal intake of oral diet and TPN  MONITORING/EVALUATION(Goals): Patient will meet 90-100% of estimated nutrition needs with TPN and oral intake.   Monitor: TPN tolerance, transition to oral diet, weight trends, labs  EDUCATION NEEDS: -No education needs identified at this time  INTERVENTION: 1. Advance TPN per pharmacy to goal rate of 60 ml/hr + lipids at 10 ml/hr 2. Encourage PO intake in order to transition from TPN.    DOCUMENTATION CODES Per approved criteria  -Not Applicable  Linnell Fulling Alexandra 10/15/2011, 11:28 AM

## 2011-10-15 NOTE — Progress Notes (Signed)
PARENTERAL NUTRITION CONSULT NOTE - Follow Up  Pharmacy Consult for TNA Indication: Prolonged Ileus  Allergies  Allergen Reactions  . Demerol   . Esgic (Butalbital-Apap-Caffeine)   . Iodine Other (See Comments)    bp bottomed out per pt several hours later; unsure if pre medicated in past with cm; done in [place]    Patient Measurements: Height: 5\' 2"  (157.5 cm) Weight: 132 lb 11.5 oz (60.2 kg) IBW/kg (Calculated) : 50.1  Adjusted Body Weight: 55.1 kg  Vital Signs: Temp: 101.1 F (38.4 C) (04/14 0800) Temp src: Core (Comment) (04/14 0800) BP: 105/57 mmHg (04/14 0744) Pulse Rate: 90  (04/14 1000) Intake/Output from previous day: 04/13 0701 - 04/14 0700 In: 2205.5 [P.O.:480; I.V.:1054.8; IV Piggyback:60; TPN:610.7] Out: 3275 [Urine:3175; Stool:100]  Labs:  [facility] 10/15/11 0430 10/14/11 0510 10/13/11 0500  WBC 12.5* 11.3* 11.5*  HGB 9.5* 9.5* 9.9*  HCT 29.0* 28.8* 29.9*  PLT 546* 477* 399  APTT -- -- --  INR -- -- --     Basename 10/14/11 0510 10/13/11 0500  NA 134* 134*  K 4.1 3.8  CL 97 98  CO2 29 28  GLUCOSE 123* 116*  BUN 3* 3*  CREATININE 0.47* 0.46*  LABCREA -- --  CREAT24HRUR -- --  CALCIUM 8.5 8.3*  MG 1.8 1.7  PHOS 3.4 --  PROT 5.7* 5.5*  ALBUMIN 1.8* 1.7*  AST 25 36  ALT 53* 68*  ALKPHOS 75 73  BILITOT 0.2* 0.2*  BILIDIR -- --  IBILI -- --  PREALBUMIN 4.0* --  TRIG 177* --  CHOLHDL -- --  CHOL 106 --   Estimated Creatinine Clearance: 61.5 ml/min (by C-G formula based on Cr of 0.47).    Basename 10/15/11 0609 10/15/11 0011 10/14/11 1804  GLUCAP 133* 134* 136*    Medical History: Past Medical History  Diagnosis Date  . Migraine   . Hypertension     Insulin Requirements in the past 24 hours:  CBGs:101, 136, 134, 133 SSI: 3 units  Nutritional Goals:  1488 kCal, 75 grams of protein per day Clinimix E 5/15 goal rate of 60 ml/hr + Lipids10 ml/hr = 72 g/day protein and 1502 Kcal/day.  Current Nutrition:  Regular diet  (4/13) Clinimix E 5/15 @ 40 ml/hr + Lipids @ 10 ml/hr IVF: D5 1/2NS K40 @ 35 ml/hr  Assessment:  61yof admitted with perforated viscus s/p exploratory lap, left colectomy with colostomy, and lavage on 10/04/11.  TNA started for prolonged ileus on 4/13.   Per MD, tolerating diet, had some solid food; intermittent mild nausea and intermittent abd pain.  Ostomy functioning.  Continue TNA for now.   CBGs are stable and within goal (< 150)  Album is low at 1.8 (4/13), ALT was elevated, then improved.  AST wnl (4/13)  Prealbumin 4 (4/13)  Cdiff PRC negative (4/12)  Ppx: Protonix IV  Plan:   Advance Clinimix E 5/15 to goal rate of 60 ml/hr  Continue Lipids IV at 10 ml/hr  Standard multivitamins and trace elements(MWF only due to ongoing shortage).  Reduce IVF to [facility]  Continue sensitive SSI q4h.  TNA lab panels on Mondays & Thursdays.   Lynann Beaver PharmD, BCPS Pager [phone] 10/15/2011 10:19 AM

## 2011-10-15 NOTE — Progress Notes (Signed)
Patient ID: Stacy Dalton, female   DOB: Jan 05, 1951, 61 y.o.   MRN: [id number] Infectious Diseases Initial Consultation        Day 12 ertapenem  Date of Admission:  10/04/2011  Date of Consult:  10/15/2011  Reason for Consult: Persistent fever Referring Physician: Dr. Cicero Duck   Problem List:  Principal Problem:  *Perforated bowel Active Problems:  Fever postop  Peritonitis  SIRS (systemic inflammatory response syndrome)  COPD (chronic obstructive pulmonary disease)  Hypertension  Chronic back pain  Tobacco use  Migraine  Normocytic anemia   Recommendations: 1. Change in ertapenem to cefepime and metronidazole   Assessment: I suspect her persistent fever is the remnants of systemic inflammatory response caused by her peritonitis related to sigmoid perforation. However she may be developing an early healthcare associated pneumonia. A recent CT scan showed some bibasilar atelectasis versus early infiltrate and a small right effusion. He did not show any obvious intra-abdominal or pelvic abscesses. Since the fevers are persisting on the ertapenem I will alter her regimen to see if that helps. Dr. Ninetta Lights will followup tomorrow.    HPI: Stacy Dalton is a 61 y.o. female who developed a sudden abdominal pain leading to admission on April 3. She was found to have free air related to a sigmoid perforation. She was emergently to surgery and a large amount of stool was found contaminating the peritoneum. The peritoneum was cleansed and a colostomy was performed. She had early sepsis upon presentation. She was placed on empiric ertapenem and has had slow overall improvement but has had persistent fevers to 101 or greater on a daily basis. She is feeling extremely weak. Over the past 48 hours she has noted some pleuritic pain just to the right of her sternum under her right breast.  She has no spontaneous cough but is trying to cough and use her incentive inspirometer. She describes  some mild shortness of breath.   Review of Systems: Pertinent items are noted in HPI.     [name] antiseptic oral rinse  15 mL Mouth Rinse BID  . ertapenem (INVANZ) IV  1 g Intravenous Q24H  . fentaNYL   Intravenous Q4H  . heparin  5,000 Units Subcutaneous Q8H  . insulin aspart  0-9 Units Subcutaneous Q6H  . pantoprazole (PROTONIX) IV  40 mg Intravenous Q12H  . sodium chloride  10-40 mL Intracatheter Q12H    Past Medical History  Diagnosis Date  . Migraine   . Hypertension     History  Substance Use Topics  . Smoking status: Former Smoker    Quit date: 10/03/1997  . Smokeless tobacco: Never Used  . Alcohol Use: No    History reviewed. No pertinent family history. Allergies  Allergen Reactions  . Demerol   . Esgic (Butalbital-Apap-Caffeine)   . Iodine Other (See Comments)    bp bottomed out per pt several hours later; unsure if pre medicated in past with cm; done in [place]    OBJECTIVE: Blood pressure 103/58, pulse 102, temperature 98.4 F (36.9 C), temperature source Oral, resp. rate 26, height 5\' 2"  (1.575 m), weight 60.2 kg (132 lb 11.5 oz), SpO2 98.00%. General: She is pale and weak but alert and in no obvious distress Skin: No rash. Her left arm PICC site appears normal Lungs: Clear anteriorly Cor: Regular S1 and S2 no murmurs Abdomen: Mild diffuse tenderness. Her wound looks good. Her colostomy appears normal Joints and extremities: Normal  Microbiology: Recent Results (from the past 240  hour(s))  CULTURE, BLOOD (ROUTINE X 2)     Status: Normal (Preliminary result)   Collection Time   10/11/11  4:00 PM      Component Value Range Status Comment   Specimen Description BLOOD RIGHT HAND   Final    Special Requests BOTTLES DRAWN AEROBIC AND ANAEROBIC 10CC   Final    Culture  Setup Time [id number]   Final    Culture     Final    Value:        BLOOD CULTURE RECEIVED NO GROWTH TO DATE CULTURE WILL BE HELD FOR 5 DAYS BEFORE ISSUING A FINAL NEGATIVE REPORT   Report  Status PENDING   Incomplete   CULTURE, BLOOD (ROUTINE X 2)     Status: Normal (Preliminary result)   Collection Time   10/11/11  4:20 PM      Component Value Range Status Comment   Specimen Description BLOOD RIGHT ARM   Final    Special Requests BOTTLES DRAWN AEROBIC AND ANAEROBIC 10CC   Final    Culture  Setup Time [id number]   Final    Culture     Final    Value:        BLOOD CULTURE RECEIVED NO GROWTH TO DATE CULTURE WILL BE HELD FOR 5 DAYS BEFORE ISSUING A FINAL NEGATIVE REPORT   Report Status PENDING   Incomplete   CLOSTRIDIUM DIFFICILE BY PCR     Status: Normal   Collection Time   10/13/11  6:36 PM      Component Value Range Status Comment   C difficile by pcr NEGATIVE  NEGATIVE  Final     Cliffton Asters, MD [facility] [phone] pager   [phone] cell 10/15/2011, 5:03 PM

## 2011-10-16 LAB — COMPREHENSIVE METABOLIC PANEL
ALT: 38 U/L — ABNORMAL HIGH (ref 0–35)
AST: 20 U/L (ref 0–37)
Albumin: 1.9 g/dL — ABNORMAL LOW (ref 3.5–5.2)
Alkaline Phosphatase: 78 U/L (ref 39–117)
BUN: 7 mg/dL (ref 6–23)
CO2: 31 mEq/L (ref 19–32)
Calcium: 8.8 mg/dL (ref 8.4–10.5)
Chloride: 93 mEq/L — ABNORMAL LOW (ref 96–112)
Creatinine, Ser: 0.46 mg/dL — ABNORMAL LOW (ref 0.50–1.10)
GFR calc Af Amer: >90 mL/min (ref >90)
GFR calc non Af Amer: >90 mL/min (ref >90)
Glucose, Bld: 123 mg/dL — ABNORMAL HIGH (ref 70–99)
Potassium: 4 mEq/L (ref 3.5–5.1)
Sodium: 132 mEq/L — ABNORMAL LOW (ref 135–145)
Total Bilirubin: 0.2 mg/dL — ABNORMAL LOW (ref 0.3–1.2)
Total Protein: 6.1 g/dL (ref 6.0–8.3)

## 2011-10-16 LAB — DIFFERENTIAL
Basophils Absolute: 0 10*3/uL (ref 0.0–0.1)
Basophils Relative: 0 % (ref 0–1)
Eosinophils Absolute: 0.2 10*3/uL (ref 0.0–0.7)
Eosinophils Relative: 2 % (ref 0–5)
Lymphocytes Relative: 15 % (ref 12–46)
Lymphs Abs: 1.7 10*3/uL (ref 0.7–4.0)
Monocytes Absolute: 1.7 10*3/uL — ABNORMAL HIGH (ref 0.1–1.0)
Monocytes Relative: 14 % — ABNORMAL HIGH (ref 3–12)
Neutro Abs: 8.2 10*3/uL — ABNORMAL HIGH (ref 1.7–7.7)
Neutrophils Relative %: 69 % (ref 43–77)

## 2011-10-16 LAB — PREALBUMIN: Prealbumin: 5.4 mg/dL — ABNORMAL LOW (ref 17.0–34.0)

## 2011-10-16 LAB — GLUCOSE, CAPILLARY
Glucose-Capillary: 143 mg/dL — ABNORMAL HIGH (ref 70–99)
Glucose-Capillary: 125 mg/dL — ABNORMAL HIGH (ref 70–99)
Glucose-Capillary: 96 mg/dL (ref 70–99)
Glucose-Capillary: 120 mg/dL — ABNORMAL HIGH (ref 70–99)
Glucose-Capillary: 108 mg/dL — ABNORMAL HIGH (ref 70–99)

## 2011-10-16 LAB — CHOLESTEROL, TOTAL: Cholesterol: 99 mg/dL (ref 0–200)

## 2011-10-16 LAB — CBC
HCT: 27.6 % — ABNORMAL LOW (ref 36.0–46.0)
Hemoglobin: 9.1 g/dL — ABNORMAL LOW (ref 12.0–15.0)
MCH: 29.4 pg (ref 26.0–34.0)
MCHC: 33 g/dL (ref 30.0–36.0)
MCV: 89 fL (ref 78.0–100.0)
Platelets: 610 10*3/uL — ABNORMAL HIGH (ref 150–400)
RBC: 3.1 MIL/uL — ABNORMAL LOW (ref 3.87–5.11)
RDW: 13.3 % (ref 11.5–15.5)
WBC: 11.9 10*3/uL — ABNORMAL HIGH (ref 4.0–10.5)

## 2011-10-16 LAB — PHOSPHORUS: Phosphorus: 4.4 mg/dL (ref 2.3–4.6)

## 2011-10-16 LAB — TRIGLYCERIDES: Triglycerides: 136 mg/dL (ref <150)

## 2011-10-16 LAB — MAGNESIUM: Magnesium: 1.9 mg/dL (ref 1.5–2.5)

## 2011-10-16 MED ORDER — CLINIMIX E/DEXTROSE (5/15) 5 % IV SOLN
INTRAVENOUS | Status: AC
  Filled 2011-10-16: qty 1000

## 2011-10-16 MED ORDER — CLINIMIX E/DEXTROSE (5/15) 5 % IV SOLN
INTRAVENOUS | Status: AC
  Filled 2011-10-16: qty 1440

## 2011-10-16 MED ORDER — ADULT MULTIVITAMIN W/MINERALS CH
1.0000 | ORAL_TABLET | Freq: Every day | ORAL | Status: DC
  Administered 2011-10-17 – 2011-10-24 (×8): 1 via ORAL
  Filled 2011-10-16 (×9): qty 1

## 2011-10-16 MED ORDER — INSULIN ASPART 100 UNIT/ML ~~LOC~~ SOLN
0.0000 [IU] | Freq: Three times a day (TID) | SUBCUTANEOUS | Status: DC
  Administered 2011-10-17 – 2011-10-24 (×4): 1 [IU] via SUBCUTANEOUS

## 2011-10-16 MED ORDER — FAT EMULSION 20 % IV EMUL
240.0000 mL | INTRAVENOUS | Status: AC
  Administered 2011-10-16: 240 mL via INTRAVENOUS
  Filled 2011-10-16: qty 250

## 2011-10-16 NOTE — Progress Notes (Signed)
  General Surgery [facility] - Progress Note  POD# 12  Subjective: Patient alert and responsive.  No specific complaints.  Wants to go home.  Poor appetite yesterday.  Objective: Vital signs in last 24 hours: Temp:  [98.4 F (36.9 C)-100.7 F (38.2 C)] 100.1 F (37.8 C) (04/15 0400) Pulse Rate:  [87-109] 87  (04/15 0757) Resp:  [14-26] 18  (04/15 0757) BP: (103-112)/(52-62) 105/62 mmHg (04/15 0800) SpO2:  [94 %-98 %] 96 % (04/15 0757) Weight:  [133 lb (60.328 kg)] 133 lb (60.328 kg) (04/15 0000) Last BM Date:  (colostomy)  Intake/Output from previous day: 04/14 0701 - 04/15 0700 In: 2479 [I.V.:725; IV Piggyback:274; TPN:1480] Out: 1700 [Urine:1550; Stool:150]  Exam: HEENT - clear, not icteric Neck - soft Chest - clear bilaterally Cor - RRR, no murmur Abd - soft, mild distension; BS present; stoma LLQ with small soft stool; midline wound with dressing intact Ext - no significant edema Neuro - grossly intact, no focal deficits  Lab Results:   Basename 10/16/11 0430 10/15/11 0430  WBC 11.9* 12.5*  HGB 9.1* 9.5*  HCT 27.6* 29.0*  PLT 610* 546*     Basename 10/16/11 0430 10/14/11 0510  NA 132* 134*  K 4.0 4.1  CL 93* 97  CO2 31 29  GLUCOSE 123* 123*  BUN 7 3*  CREATININE 0.46* 0.47*  CALCIUM 8.8 8.5    Studies/Results: No results found.  Assessment: Status post ex lap for colonic perforation  Plan: Encourage regular diet Discontinue TNA today Transfer to regular floor bed today OOB, ambulate PT consult for mobilization, assessment for Rehab placement Wound care, discontinue binder secondary to respiratory compromise  Velora Heckler, MD, [facility] Office: [phone]  10/16/2011

## 2011-10-16 NOTE — Progress Notes (Signed)
Occupational Therapy Treatment Patient Details Name: Stacy Dalton MRN: [id number] DOB: 08-10-1950 Today's Date: 10/16/2011  OT Assessment/Plan OT Assessment/Plan Comments on Treatment Session: Pt making slow, steady progress towards goals. Con't to recommend HHOT and 3:1. OT Plan: Discharge plan remains appropriate OT Frequency: Min 2X/week Follow Up Recommendations: Home health OT Equipment Recommended: 3 in 1 bedside comode OT Goals ADL Goals ADL Goal: Grooming - Progress: Progressing toward goals ADL Goal: Toilet Transfer - Progress: Progressing toward goals ADL Goal: Toileting - Clothing Manipulation - Progress: Progressing toward goals ADL Goal: Toileting - Hygiene - Progress: Progressing toward goals  OT Treatment Precautions/Restrictions  Precautions Precautions: Fall Required Braces or Orthoses: Other Brace/Splint (abdominal binder)   ADL ADL Grooming: Performed;Supervision/safety;Wash/dry hands Where Assessed - Grooming: Standing at sink Toilet Transfer: Performed;Minimal Dentist Method: Proofreader: Regular height toilet;Grab bars Toileting - Clothing Manipulation: Performed;Supervision/safety Where Assessed - Toileting Clothing Manipulation: Sit to stand from 3-in-1 or toilet Toileting - Hygiene: Performed;Supervision/safety Where Assessed - Toileting Hygiene: Sit to stand from 3-in-1 or toilet Ambulation Related to ADLs: Pt continues to fatigue quickly with ambulation. o2 sats decreased to 85% on RA following return from the bathroom Mobility  Bed Mobility Supine to Sit: 5: Supervision;With rails;HOB elevated (Comment degrees) Sit to Supine: 4: Min assist;HOB elevated (comment degrees);With rail Transfers Sit to Stand: With upper extremity assist;From bed;From toilet;Other (comment) ([place]ingu[place] A) Sit to Stand Details (indicate cue type and reason): 1 VC for hand placement Stand to Sit: With upper extremity  assist;With armrests;To toilet;To bed (Minguard A) Stand to Sit Details: 1 VC for hand placement Exercises    End of Session OT - End of Session Activity Tolerance: Patient limited by fatigue;Patient limited by pain Patient left: in bed;with call bell in reach Nurse Communication: Other (comment) (Pt's request for a hot pack.) General Behavior During Session: [facility] for tasks performed Cognition: [facility] for tasks performed  [place] A OTR/L [phone] 10/16/2011, 4:04 PM

## 2011-10-16 NOTE — Progress Notes (Signed)
Spoke with patient and husband at bedside regarding HH recommendations for PT/OT and 3-in-1. Patient request information on who preferred provider would be and what copays she would have to pay. Benefits check requested of [employer] to provide further information to patient.

## 2011-10-16 NOTE — Progress Notes (Addendum)
UR complete 

## 2011-10-16 NOTE — Progress Notes (Signed)
INFECTIOUS DISEASE PROGRESS NOTE  ID: Stacy Dalton is a 61 y.o. female with   Principal Problem:  *Perforated bowel Active Problems:  Peritonitis  SIRS (systemic inflammatory response syndrome)  COPD (chronic obstructive pulmonary disease)  Hypertension  Chronic back pain  Tobacco use  Fever postop  Migraine  Normocytic anemia  Subjective: 61 yo F adm 4-3 with bowel perforation. In hospital her course was complicated by persistent fever.  Feels stronger.  Abtx:  Anti-infectives     Start     Dose/Rate Route Frequency Ordered Stop   10/15/11 1800   ceFEPIme (MAXIPIME) 2 g in dextrose 5 % 50 mL IVPB        2 g 100 mL/hr over 30 Minutes Intravenous Every 12 hours 10/15/11 1710     10/15/11 1800   metroNIDAZOLE (FLAGYL) IVPB 500 mg        500 mg 100 mL/hr over 60 Minutes Intravenous Every 8 hours 10/15/11 1710     10/04/11 2359   piperacillin-tazobactam (ZOSYN) IVPB 3.375 g  Status:  Discontinued        3.375 g 12.5 mL/hr over 240 Minutes Intravenous 3 times per day 10/04/11 1607 10/04/11 2040   10/04/11 2200   ertapenem (INVANZ) 1 g in sodium chloride 0.9 % 50 mL IVPB  Status:  Discontinued        1 g 100 mL/hr over 30 Minutes Intravenous Every 24 hours 10/04/11 2040 10/15/11 1710   10/04/11 2000   vancomycin (VANCOCIN) 750 mg in sodium chloride 0.9 % 150 mL IVPB  Status:  Discontinued        750 mg 150 mL/hr over 60 Minutes Intravenous Every 8 hours 10/04/11 1921 10/05/11 1523   10/04/11 1600  piperacillin-tazobactam (ZOSYN) IVPB 3.375 g       3.375 g 12.5 mL/hr over 240 Minutes Intravenous  Once 10/04/11 1528 10/04/11 1958          Medications:  Scheduled:   . antiseptic oral rinse  15 mL Mouth Rinse BID  . ceFEPime (MAXIPIME) IV  2 g Intravenous Q12H  . fentaNYL   Intravenous Q4H  . heparin  5,000 Units Subcutaneous Q8H  . insulin aspart  0-9 Units Subcutaneous TID WC  . metronidazole  500 mg Intravenous Q8H  . mulitivitamin with minerals  1 tablet  Oral Daily  . pantoprazole (PROTONIX) IV  40 mg Intravenous Q12H  . sodium chloride  10-40 mL Intracatheter Q12H  . DISCONTD: ertapenem (INVANZ) IV  1 g Intravenous Q24H  . DISCONTD: insulin aspart  0-9 Units Subcutaneous Q6H    Objective: Vital signs in last 24 hours: Temp:  [98.9 F (37.2 C)-100.7 F (38.2 C)] 98.9 F (37.2 C) (04/15 1400) Pulse Rate:  [87-105] 94  (04/15 1400) Resp:  [15-26] 18  (04/15 1400) BP: (102-112)/(52-62) 106/60 mmHg (04/15 1400) SpO2:  [94 %-98 %] 95 % (04/15 1400) Weight:  [60.328 kg (133 lb)] 60.328 kg (133 lb) (04/15 0000)   General appearance: alert, cooperative and no distress Resp: clear to auscultation bilaterally Cardio: regular rate and rhythm GI: abnormal findings:  she has a midline wound with clean dressings. she has BS+, her abd is tender but not distended.   Lab Results  Basename 10/16/11 0430 10/15/11 0430 10/14/11 0510  WBC 11.9* 12.5* --  HGB 9.1* 9.5* --  HCT 27.6* 29.0* --  NA 132* -- 134*  K 4.0 -- 4.1  CL 93* -- 97  CO2 31 -- 29  BUN 7 --  3*  CREATININE 0.46* -- 0.47*  GLU -- -- --   Liver Panel  Basename 10/16/11 0430 10/14/11 0510  PROT 6.1 5.7*  ALBUMIN 1.9* 1.8*  AST 20 25  ALT 38* 53*  ALKPHOS 78 75  BILITOT 0.2* 0.2*  BILIDIR -- --  IBILI -- --   Sedimentation Rate No results found for this basename: ESRSEDRATE in the last 72 hours C-Reactive Protein No results found for this basename: CRP:2 in the last 72 hours  Microbiology: Recent Results (from the past 240 hour(s))  CULTURE, BLOOD (ROUTINE X 2)     Status: Normal (Preliminary result)   Collection Time   10/11/11  4:00 PM      Component Value Range Status Comment   Specimen Description BLOOD RIGHT HAND   Final    Special Requests BOTTLES DRAWN AEROBIC AND ANAEROBIC 10CC   Final    Culture  Setup Time [id number]   Final    Culture     Final    Value:        BLOOD CULTURE RECEIVED NO GROWTH TO DATE CULTURE WILL BE HELD FOR 5 DAYS BEFORE ISSUING  A FINAL NEGATIVE REPORT   Report Status PENDING   Incomplete   CULTURE, BLOOD (ROUTINE X 2)     Status: Normal (Preliminary result)   Collection Time   10/11/11  4:20 PM      Component Value Range Status Comment   Specimen Description BLOOD RIGHT ARM   Final    Special Requests BOTTLES DRAWN AEROBIC AND ANAEROBIC 10CC   Final    Culture  Setup Time [id number]   Final    Culture     Final    Value:        BLOOD CULTURE RECEIVED NO GROWTH TO DATE CULTURE WILL BE HELD FOR 5 DAYS BEFORE ISSUING A FINAL NEGATIVE REPORT   Report Status PENDING   Incomplete   CLOSTRIDIUM DIFFICILE BY PCR     Status: Normal   Collection Time   10/13/11  6:36 PM      Component Value Range Status Comment   C difficile by pcr NEGATIVE  NEGATIVE  Final   URINE CULTURE     Status: Normal   Collection Time   10/14/11  8:19 AM      Component Value Range Status Comment   Specimen Description URINE, CATHETERIZED   Final    Special Requests Invanz   Final    Culture  Setup Time [id number]   Final    Colony Count NO GROWTH   Final    Culture NO GROWTH   Final    Report Status 10/15/2011 FINAL   Final     Studies/Results: No results found.   Assessment/Plan: Abdominal Perforation     Repaired 4-3 ? HCAP BCx NGTD, UCx (-), C diff (-) Cefpime/flagyl day 2, anbx day 13 WBC is stable, her temp is down somewhat, low grade.  Will continue to watch.    Johny Sax Infectious Diseases [phone] 10/16/2011, 2:36 PM   LOS: 12 days

## 2011-10-16 NOTE — Progress Notes (Signed)
  Pharmacy Note (Brief) - TNA Discontinuation Indication: Prolonged Ileus  61yof admitted with perforated viscus s/p exploratory lap, left colectomy with colostomy, and lavage on 10/04/11. TNA started on 4/13 for prolonged ileus. Pt now eating regular diet. After discussion with Dr. Gerrit Friends, ok to taper current TNA to off by 18:00 tonight. (Currently running Clin E 5/15 at 67ml/hr + IV lipids at 28ml/hr).  No further TNA after 18:00 tonight.  Plan: 1) Reduce Clinimix E 5/15 to 40 ml/hr at 10 am (leave IV lipids at 54ml/hr) 2) Will leave IVF at [facility] - further rate changes per MD 3) At 18:00 tonight, D/C Clinimix E 5/15 and IV lipids - discard unused portion. 4) Change SSI (sens scale) and CBGs to TID with meals. 5) Change multivitamin to PO (starting tomorrow) 6) D/C all TNA labs  Darrol Angel, PharmD Pager: [phone] 10/16/2011 8:25 AM

## 2011-10-17 LAB — GLUCOSE, CAPILLARY
Glucose-Capillary: 123 mg/dL — ABNORMAL HIGH (ref 70–99)
Glucose-Capillary: 106 mg/dL — ABNORMAL HIGH (ref 70–99)
Glucose-Capillary: 113 mg/dL — ABNORMAL HIGH (ref 70–99)
Glucose-Capillary: 122 mg/dL — ABNORMAL HIGH (ref 70–99)

## 2011-10-17 MED ORDER — HYDROCODONE-ACETAMINOPHEN 5-325 MG PO TABS
1.0000 | ORAL_TABLET | Freq: Four times a day (QID) | ORAL | Status: DC | PRN
  Administered 2011-10-17: 1 via ORAL
  Administered 2011-10-18 – 2011-10-21 (×11): 2 via ORAL
  Filled 2011-10-17 (×6): qty 2
  Filled 2011-10-17: qty 1
  Filled 2011-10-17 (×4): qty 2
  Filled 2011-10-17: qty 1
  Filled 2011-10-17: qty 2

## 2011-10-17 NOTE — Plan of Care (Signed)
Problem: Inadequate Intake (NI-2.1) Goal: Food and/or nutrient delivery Individualized approach for food/nutrient provision.  Outcome: Completed/Met Date Met:  10/17/11 Intake 50-75% of meals.

## 2011-10-17 NOTE — Progress Notes (Signed)
[facility] - Attending  Patient seen and examined.  Taking more oral intake today.  Mild nausea.  Ambulated with PT.  Steady progress.  Velora Heckler, MD, [facility] Office: [phone]

## 2011-10-17 NOTE — Progress Notes (Signed)
Nutrition Follow-up  Diet Order: Regular  - TPN d/c yesterday. POD# 13 exploratory laparotomy, left colectomy, closure distal segment, and descending colostomy. Pt reports she is not hungry but she has been making herself eating. Pt reports eating 1 of her eggs, almost all of her bacon, and 100% of juice this morning for breakfast and 75% of dinner last night. Pt reports having nausea before breakfast and nausea currently improved. Pt reports nausea might be from her nerves. Pt with 3 loose yellow stools yesterday from colostomy. Noted pt on antibiotics which are likely contributing to loose stools.   Meds: Scheduled Meds:   . antiseptic oral rinse  15 mL Mouth Rinse BID  . ceFEPime (MAXIPIME) IV  2 g Intravenous Q12H  . fentaNYL   Intravenous Q4H  . heparin  5,000 Units Subcutaneous Q8H  . insulin aspart  0-9 Units Subcutaneous TID WC  . metronidazole  500 mg Intravenous Q8H  . mulitivitamin with minerals  1 tablet Oral Daily  . pantoprazole (PROTONIX) IV  40 mg Intravenous Q12H  . sodium chloride  10-40 mL Intracatheter Q12H   Continuous Infusions:   . dextrose 5 % and 0.45% NaCl 1,000 mL with potassium chloride 40 mEq infusion 20 mL/hr at 10/16/11 0652  . TPN (CLINIMIX) +/- additives 60 mL/hr at 10/16/11 1008   And  . fat emulsion 240 mL (10/16/11 1009)  . TPN (CLINIMIX) +/- additives 40 mL/hr at 10/16/11 1000   PRN Meds:.acetaminophen, diphenhydrAMINE, diphenhydrAMINE, fentaNYL, menthol-cetylpyridinium, naloxone, ondansetron (ZOFRAN) IV, promethazine, simethicone, sodium chloride, sodium chloride, traMADol  Labs:  CMP     Component Value Date/Time   NA 132* 10/16/2011 0430   K 4.0 10/16/2011 0430   CL 93* 10/16/2011 0430   CO2 31 10/16/2011 0430   GLUCOSE 123* 10/16/2011 0430   BUN 7 10/16/2011 0430   CREATININE 0.46* 10/16/2011 0430   CALCIUM 8.8 10/16/2011 0430   PROT 6.1 10/16/2011 0430   ALBUMIN 1.9* 10/16/2011 0430   AST 20 10/16/2011 0430   ALT 38* 10/16/2011 0430   ALKPHOS  78 10/16/2011 0430   BILITOT 0.2* 10/16/2011 0430   GFRNONAA >90 10/16/2011 0430   GFRAA >90 10/16/2011 0430     Intake/Output Summary (Last 24 hours) at 10/17/11 0941 Last data filed at 10/17/11 0700  Gross per 24 hour  Intake   1050 ml  Output   2350 ml  Net  -1300 ml   Colostomy output - 3 loose stools on 4/15   Weight Status:   4/11 138 lb 3.7 oz 4/15 133 lb  Re-estimated needs: No changes. 1450-1550 calories 72-85g  Nutrition Dx: Inadequate oral intake - resolved   Goal:  Pt will meet 90-100% of estimated needs with TPN and oral intake - met with oral intake, TPN d/c yesterday.   Intervention: Encouraged continued excellent intake. Educated pt on nutrition for colostomy and provided handout of this information. Encouraged intake of high protein foods for wound healing. Pt expressed understanding. Recommend Florastor.   Monitor: Weights, labs, intake, colostomy output, nausea   Marshall Cork Pager #: [phone]

## 2011-10-17 NOTE — Consult Note (Signed)
WOC ostomy consult  Stoma type/location: LLQ Colostomy Stomal assessment/size: 1 and 1/4  x 1 and 1/2 inches.  Os is in center and pointing upward. Stoma is light pink and moist. Peristomal assessment: clear, intact Treatment options for stomal/peristomal skin: none indicated Output small amount of light brown stool in pouch. Ostomy pouching: 1pc. Pouching system with convexity applied today due to mild depression noted from 8-10 o'clock. ([place]# [phone])  Education provided: A new patient education booklet is provided today as patient cannot find her previously provided book(s).  Patient taught characteristics of stoma, expected length of wear for pouch (change twice weekly), and the probable need to empty pouch 4 to 5 times each day (stool or flatus).  Patient taught to open pouch end, provided return demonstration of this today. Patient's husband had oncology appointment today and will not be in until late in the day.  Will attempt to have ed session with him tomorrow.  Patient established with [facility] post op sampling program today. We will follow.   Thanks, Ladona Mow, MSN, RN, [facility], CWOCN [phone])

## 2011-10-17 NOTE — Progress Notes (Signed)
INFECTIOUS DISEASE PROGRESS NOTE  ID: Stacy Dalton is a 61 y.o. female with  Principal Problem:  *Perforated bowel Active Problems:  Peritonitis  SIRS (systemic inflammatory response syndrome)  COPD (chronic obstructive pulmonary disease)  Hypertension  Chronic back pain  Tobacco use  Fever postop  Migraine  Normocytic anemia  Subjective: Feels stronger, continued pain in abd, pain with breathing.   Abtx:  Anti-infectives     Start     Dose/Rate Route Frequency Ordered Stop   10/15/11 1800   ceFEPIme (MAXIPIME) 2 g in dextrose 5 % 50 mL IVPB        2 g 100 mL/hr over 30 Minutes Intravenous Every 12 hours 10/15/11 1710     10/15/11 1800   metroNIDAZOLE (FLAGYL) IVPB 500 mg        500 mg 100 mL/hr over 60 Minutes Intravenous Every 8 hours 10/15/11 1710     10/04/11 2359   piperacillin-tazobactam (ZOSYN) IVPB 3.375 g  Status:  Discontinued        3.375 g 12.5 mL/hr over 240 Minutes Intravenous 3 times per day 10/04/11 1607 10/04/11 2040   10/04/11 2200   ertapenem (INVANZ) 1 g in sodium chloride 0.9 % 50 mL IVPB  Status:  Discontinued        1 g 100 mL/hr over 30 Minutes Intravenous Every 24 hours 10/04/11 2040 10/15/11 1710   10/04/11 2000   vancomycin (VANCOCIN) 750 mg in sodium chloride 0.9 % 150 mL IVPB  Status:  Discontinued        750 mg 150 mL/hr over 60 Minutes Intravenous Every 8 hours 10/04/11 1921 10/05/11 1523   10/04/11 1600  piperacillin-tazobactam (ZOSYN) IVPB 3.375 g       3.375 g 12.5 mL/hr over 240 Minutes Intravenous  Once 10/04/11 1528 10/04/11 1958          Medications:  Scheduled:   . antiseptic oral rinse  15 mL Mouth Rinse BID  . ceFEPime (MAXIPIME) IV  2 g Intravenous Q12H  . fentaNYL   Intravenous Q4H  . heparin  5,000 Units Subcutaneous Q8H  . insulin aspart  0-9 Units Subcutaneous TID WC  . metronidazole  500 mg Intravenous Q8H  . mulitivitamin with minerals  1 tablet Oral Daily  . pantoprazole (PROTONIX) IV  40 mg Intravenous  Q12H  . sodium chloride  10-40 mL Intracatheter Q12H    Objective: Vital signs in last 24 hours: Temp:  [98.1 F (36.7 C)-99.6 F (37.6 C)] 98.1 F (36.7 C) (04/16 0615) Pulse Rate:  [91-103] 91  (04/16 0615) Resp:  [12-22] 20  (04/16 0615) BP: (100-118)/(54-62) 100/62 mmHg (04/16 0615) SpO2:  [94 %-95 %] 95 % (04/16 0615)   General appearance: alert, cooperative and no distress Resp: clear to auscultation bilaterally Cardio: regular rate and rhythm GI: normal findings: bowel sounds normal and soft and abnormal findings:  tenderness on R, no gaurding.   Lab Results  Basename 10/16/11 0430 10/15/11 0430  WBC 11.9* 12.5*  HGB 9.1* 9.5*  HCT 27.6* 29.0*  NA 132* --  K 4.0 --  CL 93* --  CO2 31 --  BUN 7 --  CREATININE 0.46* --  GLU -- --   Liver Panel  Basename 10/16/11 0430  PROT 6.1  ALBUMIN 1.9*  AST 20  ALT 38*  ALKPHOS 78  BILITOT 0.2*  BILIDIR --  IBILI --    Microbiology: Recent Results (from the past 240 hour(s))  CULTURE, BLOOD (ROUTINE X 2)  Status: Normal (Preliminary result)   Collection Time   10/11/11  4:00 PM      Component Value Range Status Comment   Specimen Description BLOOD RIGHT HAND   Final    Special Requests BOTTLES DRAWN AEROBIC AND ANAEROBIC 10CC   Final    Culture  Setup Time [id number]   Final    Culture     Final    Value:        BLOOD CULTURE RECEIVED NO GROWTH TO DATE CULTURE WILL BE HELD FOR 5 DAYS BEFORE ISSUING A FINAL NEGATIVE REPORT   Report Status PENDING   Incomplete   CULTURE, BLOOD (ROUTINE X 2)     Status: Normal (Preliminary result)   Collection Time   10/11/11  4:20 PM      Component Value Range Status Comment   Specimen Description BLOOD RIGHT ARM   Final    Special Requests BOTTLES DRAWN AEROBIC AND ANAEROBIC 10CC   Final    Culture  Setup Time [id number]   Final    Culture     Final    Value:        BLOOD CULTURE RECEIVED NO GROWTH TO DATE CULTURE WILL BE HELD FOR 5 DAYS BEFORE ISSUING A FINAL NEGATIVE  REPORT   Report Status PENDING   Incomplete   CLOSTRIDIUM DIFFICILE BY PCR     Status: Normal   Collection Time   10/13/11  6:36 PM      Component Value Range Status Comment   C difficile by pcr NEGATIVE  NEGATIVE  Final   URINE CULTURE     Status: Normal   Collection Time   10/14/11  8:19 AM      Component Value Range Status Comment   Specimen Description URINE, CATHETERIZED   Final    Special Requests Invanz   Final    Culture  Setup Time [id number]   Final    Colony Count NO GROWTH   Final    Culture NO GROWTH   Final    Report Status 10/15/2011 FINAL   Final     Studies/Results: No results found.   Assessment/Plan: Abdominal Perforation  Repaired 4-3  ? HCAP  BCx NGTD, UCx (-), C diff (-)  Cefpime/flagyl day 3, anbx day 14  Appears to be making some progress.  WBC is stable, her temp is down.  Will continue to watch.    Johny Sax Infectious Diseases [phone] 10/17/2011, 11:52 AM   LOS: 13 days

## 2011-10-17 NOTE — Progress Notes (Signed)
Physical Therapy Treatment Patient Details Name: Stacy Dalton MRN: [id number] DOB: April 26, 1951 Today's Date: 10/17/2011  PT Assessment/Plan  PT - Assessment/Plan Comments on Treatment Session: Pt continuing to slowly progress with ambulation. Ambulated on 2L O2 since RA sats were 88-89% at rest.  PT Plan: Discharge plan remains appropriate PT Frequency: Min 3X/week Follow Up Recommendations: Home health PT PT Goals  Acute Rehab PT Goals PT Goal: Sit to Stand - Progress: Progressing toward goal PT Goal: Stand to Sit - Progress: Progressing toward goal PT Goal: Ambulate - Progress: Progressing toward goal  PT Treatment Precautions/Restrictions  Precautions Precautions: Fall Required Braces or Orthoses: Other Brace/Splint (abdominal binder) Other Brace/Splint: abdominal binder. 10/17/11-pt reports MD stated she didn't have to wear it.  Restrictions Weight Bearing Restrictions: No Mobility (including Balance) Bed Mobility Bed Mobility: No Transfers Transfers: Yes Sit to Stand: From chair/3-in-1;With upper extremity assist;With armrests Sit to Stand Details (indicate cue type and reason): Min-guard assist. VCs safety.  Stand to Sit: 5: Supervision;To chair/3-in-1;With armrests;With upper extremity assist Stand to Sit Details: VCs safety.  Ambulation/Gait Ambulation/Gait: Yes Ambulation/Gait Assistance Details (indicate cue type and reason): Min Assist. Slow gait speed. Fatigues easily.  Ambulation Distance (Feet): 120 Feet Assistive device: Rolling walker Gait Pattern: Trunk flexed;Decreased stride length;Decreased step length - right;Step-through pattern    Exercise    End of Session PT - End of Session Activity Tolerance: Patient limited by fatigue;Patient limited by pain Patient left: in chair;with call bell in reach;with family/visitor present General Behavior During Session: [facility] for tasks performed Cognition: [facility] for tasks performed  Rebeca Alert  [facility] 10/17/2011, 3:06 PM [phone]

## 2011-10-17 NOTE — Progress Notes (Signed)
13 Days Post-Op  Subjective: Feels a little better, she thinks some of her issues may be due to anxiety.  Some nausea this  AM.  Colostomy bag has just been changed, but she said there was stool in it. Objective: Vital signs in last 24 hours: Temp:  [98.1 F (36.7 C)-100.2 F (37.9 C)] 98.1 F (36.7 C) (04/16 0615) Pulse Rate:  [91-103] 91  (04/16 0615) Resp:  [12-22] 20  (04/16 0615) BP: (100-118)/(54-62) 100/62 mmHg (04/16 0615) SpO2:  [94 %-95 %] 95 % (04/16 0615) Last BM Date: 10/17/11 TM99 yesterday,VSS, No labs, 600 PO recorded yesterday Intake/Output from previous day: 04/15 0701 - 04/16 0700 In: 1230 [P.O.:600; I.V.:340; IV Piggyback:150; TPN:140] Out: 2650 [Urine:2650] Intake/Output this shift:    General appearance: alert, cooperative and no distress Resp: clear to auscultation bilaterally and few rales at the bases. GI: soft, +BS, colostomy just changed so it's empty.  Wound is open and a bit soupy at the base.    Lab Results:   Basename 10/16/11 0430 10/15/11 0430  WBC 11.9* 12.5*  HGB 9.1* 9.5*  HCT 27.6* 29.0*  PLT 610* 546*    BMET  Basename 10/16/11 0430  NA 132*  K 4.0  CL 93*  CO2 31  GLUCOSE 123*  BUN 7  CREATININE 0.46*  CALCIUM 8.8   PT/INR No results found for this basename: LABPROT:2,INR:2 in the last 72 hours   Lab 10/16/11 0430 10/14/11 0510 10/13/11 0500 10/11/11 0400  AST 20 25 36 42*  ALT 38* 53* 68* 51*  ALKPHOS 78 75 73 70  BILITOT 0.2* 0.2* 0.2* 0.2*  PROT 6.1 5.7* 5.5* 5.1*  ALBUMIN 1.9* 1.8* 1.7* 1.7*     Lipase     Component Value Date/Time   LIPASE 20 10/04/2011 1345     Studies/Results: No results found.  Medications:    . antiseptic oral rinse  15 mL Mouth Rinse BID  . ceFEPime (MAXIPIME) IV  2 g Intravenous Q12H  . fentaNYL   Intravenous Q4H  . heparin  5,000 Units Subcutaneous Q8H  . insulin aspart  0-9 Units Subcutaneous TID WC  . metronidazole  500 mg Intravenous Q8H  . mulitivitamin with minerals  1  tablet Oral Daily  . pantoprazole (PROTONIX) IV  40 mg Intravenous Q12H  . 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 Derrell Lolling  Post op fever/tachycardia/hypotension  Post op nausea, pain and fever ? SIRS systemic inflammatory response syndrome? CT scan 10/09/11: ascites, mesenteric edema, pleural effusions, no abscess noted.  Migraine  Hypertension  CHRONIC BACK PAIN she uses hydrocodone almost daily  HISTORY OF TOBACCO USE/NONE FOR 13 YEARS    Plan: She is on a regular diet along with TNA, maxipime, and flagyl for antibiotic coverage.  She has limited pain control options because of her multiple allergies to demerol and Esgic.  She has Norco listed as preop med and says she can take this. Get Ot/PT working to mobilize her.  Nausea sounds like on ongoing issue from before her surgery.    LOS: 13 days    Fionna Merriott 10/17/2011

## 2011-10-18 ENCOUNTER — Encounter (HOSPITAL_COMMUNITY): Payer: Self-pay | Admitting: General Surgery

## 2011-10-18 LAB — CULTURE, BLOOD (ROUTINE X 2)
Culture  Setup Time: 201304110159
Culture  Setup Time: 201304110159
Culture: NO GROWTH
Culture: NO GROWTH

## 2011-10-18 LAB — GLUCOSE, CAPILLARY
Glucose-Capillary: 109 mg/dL — ABNORMAL HIGH (ref 70–99)
Glucose-Capillary: 111 mg/dL — ABNORMAL HIGH (ref 70–99)
Glucose-Capillary: 115 mg/dL — ABNORMAL HIGH (ref 70–99)
Glucose-Capillary: 133 mg/dL — ABNORMAL HIGH (ref 70–99)
Glucose-Capillary: 140 mg/dL — ABNORMAL HIGH (ref 70–99)

## 2011-10-18 MED ORDER — FENTANYL CITRATE 0.05 MG/ML IJ SOLN
12.5000 ug | INTRAMUSCULAR | Status: DC | PRN
  Administered 2011-10-18 – 2011-10-19 (×3): 12.5 ug via INTRAVENOUS
  Filled 2011-10-18 (×3): qty 2

## 2011-10-18 NOTE — Progress Notes (Signed)
14 Days Post-Op  Subjective: She seems to be making slow improvement.  We have he on some PO pain meds and calorie count.  Objective: Vital signs in last 24 hours: Temp:  [99.1 F (37.3 C)-99.8 F (37.7 C)] 99.1 F (37.3 C) (04/17 0620) Pulse Rate:  [84-92] 84  (04/17 0620) Resp:  [12-20] 12  (04/17 1102) BP: (95-99)/(51-66) 98/60 mmHg (04/17 0620) SpO2:  [92 %-96 %] 94 % (04/17 1102) FiO2 (%):  [94 %-96 %] 96 % (04/17 0620) Last BM Date: 10/18/11 Afebrile, VSS BP down some, No labs,,760 PO recorded Intake/Output from previous day: 04/16 0701 - 04/17 0700 In: 1310 [P.O.:760; I.V.:400; IV Piggyback:150] Out: 2550 [Urine:2550] Intake/Output this shift:    General appearance: alert, cooperative and no distress Resp: clear to auscultation bilaterally and few rales at bases GI: still tender, but less than before, abd is not distended. +BS, plenty  of stool in ostomy.  Incision is still a bit soupy at the base.   Lab Results:   [facility] 10/16/11 0430  WBC 11.9*  HGB 9.1*  HCT 27.6*  PLT 610*    BMET  Basename 10/16/11 0430  NA 132*  K 4.0  CL 93*  CO2 31  GLUCOSE 123*  BUN 7  CREATININE 0.46*  CALCIUM 8.8   PT/INR No results found for this basename: LABPROT:2,INR:2 in the last 72 hours   Lab 10/16/11 0430 10/14/11 0510 10/13/11 0500  AST 20 25 36  ALT 38* 53* 68*  ALKPHOS 78 75 73  BILITOT 0.2* 0.2* 0.2*  PROT 6.1 5.7* 5.5*  ALBUMIN 1.9* 1.8* 1.7*     Lipase     Component Value Date/Time   LIPASE 20 10/04/2011 1345     Studies/Results: No results found.  Medications:    . antiseptic oral rinse  15 mL Mouth Rinse BID  . ceFEPime (MAXIPIME) IV  2 g Intravenous Q12H  . fentaNYL   Intravenous Q4H  . heparin  5,000 Units Subcutaneous Q8H  . insulin aspart  0-9 Units Subcutaneous TID WC  . metronidazole  500 mg Intravenous Q8H  . mulitivitamin with minerals  1 tablet Oral Daily  . pantoprazole (PROTONIX) IV  40 mg Intravenous Q12H  . 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 Derrell Lolling  Post op fever/tachycardia/hypotension  Post op nausea, pain and fever ? SIRS systemic inflammatory response syndrome?  CT scan 10/09/11: ascites, mesenteric edema, pleural effusions, no abscess noted.  Migraine  Hypertension  CHRONIC BACK PAIN she uses hydrocodone almost daily  HISTORY OF TOBACCO USE/NONE FOR 13 YEARS     Plan:  D/C PCA, mobilize more, leave so IV fentanyl as back up still on antibiotics, some concern for wound, but it's open and continue dressing changes.    LOS: 14 days    Daphna Lafuente 10/18/2011

## 2011-10-18 NOTE — Progress Notes (Signed)
INFECTIOUS DISEASE PROGRESS NOTE  ID: Stacy Dalton is a 61 y.o. female with  Principal Problem:  *Perforated bowel Active Problems:  Peritonitis  SIRS (systemic inflammatory response syndrome)  COPD (chronic obstructive pulmonary disease)  Hypertension  Chronic back pain  Tobacco use  Fever postop  Migraine  Normocytic anemia  Subjective: Feels better. Eating regular food.  Husband wants to talk to MD about home health needs  Abtx:  Anti-infectives     Start     Dose/Rate Route Frequency Ordered Stop   10/15/11 1800   ceFEPIme (MAXIPIME) 2 g in dextrose 5 % 50 mL IVPB        2 g 100 mL/hr over 30 Minutes Intravenous Every 12 hours 10/15/11 1710     10/15/11 1800   metroNIDAZOLE (FLAGYL) IVPB 500 mg        500 mg 100 mL/hr over 60 Minutes Intravenous Every 8 hours 10/15/11 1710     10/04/11 2359   piperacillin-tazobactam (ZOSYN) IVPB 3.375 g  Status:  Discontinued        3.375 g 12.5 mL/hr over 240 Minutes Intravenous 3 times per day 10/04/11 1607 10/04/11 2040   10/04/11 2200   ertapenem (INVANZ) 1 g in sodium chloride 0.9 % 50 mL IVPB  Status:  Discontinued        1 g 100 mL/hr over 30 Minutes Intravenous Every 24 hours 10/04/11 2040 10/15/11 1710   10/04/11 2000   vancomycin (VANCOCIN) 750 mg in sodium chloride 0.9 % 150 mL IVPB  Status:  Discontinued        750 mg 150 mL/hr over 60 Minutes Intravenous Every 8 hours 10/04/11 1921 10/05/11 1523   10/04/11 1600  piperacillin-tazobactam (ZOSYN) IVPB 3.375 g       3.375 g 12.5 mL/hr over 240 Minutes Intravenous  Once 10/04/11 1528 10/04/11 1958          Medications:  Scheduled:   . antiseptic oral rinse  15 mL Mouth Rinse BID  . ceFEPime (MAXIPIME) IV  2 g Intravenous Q12H  . heparin  5,000 Units Subcutaneous Q8H  . insulin aspart  0-9 Units Subcutaneous TID WC  . metronidazole  500 mg Intravenous Q8H  . mulitivitamin with minerals  1 tablet Oral Daily  . pantoprazole (PROTONIX) IV  40 mg Intravenous  Q12H  . sodium chloride  10-40 mL Intracatheter Q12H  . DISCONTD: fentaNYL   Intravenous Q4H    Objective: Vital signs in last 24 hours: Temp:  [99.1 F (37.3 C)-99.6 F (37.6 C)] 99.1 F (37.3 C) (04/17 0620) Pulse Rate:  [84-87] 84  (04/17 0620) Resp:  [12-20] 12  (04/17 1102) BP: (98-99)/(60-66) 98/60 mmHg (04/17 0620) SpO2:  [92 %-95 %] 94 % (04/17 1102) FiO2 (%):  [94 %-96 %] 96 % (04/17 0620)   General appearance: alert, cooperative and no distress Cardio: regular rate and rhythm GI: normal findings: bowel sounds normal and abnormal findings:  tenderness, grimaces with light touch on R.   Lab Results  Basename 10/16/11 0430  WBC 11.9*  HGB 9.1*  HCT 27.6*  NA 132*  K 4.0  CL 93*  CO2 31  BUN 7  CREATININE 0.46*  GLU --   Liver Panel  Basename 10/16/11 0430  PROT 6.1  ALBUMIN 1.9*  AST 20  ALT 38*  ALKPHOS 78  BILITOT 0.2*  BILIDIR --  IBILI --   Sedimentation Rate No results found for this basename: ESRSEDRATE in the last 72 hours C-Reactive Protein  No results found for this basename: CRP:2 in the last 72 hours  Microbiology: Recent Results (from the past 240 hour(s))  CULTURE, BLOOD (ROUTINE X 2)     Status: Normal   Collection Time   10/11/11  4:00 PM      Component Value Range Status Comment   Specimen Description BLOOD RIGHT HAND   Final    Special Requests BOTTLES DRAWN AEROBIC AND ANAEROBIC 10CC   Final    Culture  Setup Time [id number]   Final    Culture NO GROWTH 5 DAYS   Final    Report Status 10/18/2011 FINAL   Final   CULTURE, BLOOD (ROUTINE X 2)     Status: Normal   Collection Time   10/11/11  4:20 PM      Component Value Range Status Comment   Specimen Description BLOOD RIGHT ARM   Final    Special Requests BOTTLES DRAWN AEROBIC AND ANAEROBIC 10CC   Final    Culture  Setup Time [id number]   Final    Culture NO GROWTH 5 DAYS   Final    Report Status 10/18/2011 FINAL   Final   CLOSTRIDIUM DIFFICILE BY PCR     Status: Normal     Collection Time   10/13/11  6:36 PM      Component Value Range Status Comment   C difficile by pcr NEGATIVE  NEGATIVE  Final   URINE CULTURE     Status: Normal   Collection Time   10/14/11  8:19 AM      Component Value Range Status Comment   Specimen Description URINE, CATHETERIZED   Final    Special Requests Invanz   Final    Culture  Setup Time [id number]   Final    Colony Count NO GROWTH   Final    Culture NO GROWTH   Final    Report Status 10/15/2011 FINAL   Final     Studies/Results: No results found.   Assessment/Plan: Abdominal Perforation  Repaired 4-3  ? HCAP  BCx NGTD, UCx (-), C diff (-)  Cefpime/flagyl day 4, anbx day 15 afebrile (temp in 99s) and WBC near normal. Continue to watch.   Aim for 7-8 days of cefepime/flagyl.    Johny Sax Infectious Diseases [phone] 10/18/2011, 2:48 PM   LOS: 14 days

## 2011-10-18 NOTE — Progress Notes (Signed)
Patient's husband stated, "I don't want her to receive home health. I can do her rehab and everything at home. 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 Will notify night shift at shift change later this evening.

## 2011-10-18 NOTE — Progress Notes (Signed)
General Surgery [facility] - Attending  Slow progress.  Continue wound care.  Hopefully decrease narcotic analgesics slowly.  Velora Heckler, MD, [facility] Office: [phone]

## 2011-10-19 ENCOUNTER — Inpatient Hospital Stay (HOSPITAL_COMMUNITY)

## 2011-10-19 LAB — COMPREHENSIVE METABOLIC PANEL
ALT: 18 U/L (ref 0–35)
AST: 14 U/L (ref 0–37)
Albumin: 2 g/dL — ABNORMAL LOW (ref 3.5–5.2)
Alkaline Phosphatase: 77 U/L (ref 39–117)
BUN: 6 mg/dL (ref 6–23)
CO2: 31 mEq/L (ref 19–32)
Calcium: 9 mg/dL (ref 8.4–10.5)
Chloride: 95 mEq/L — ABNORMAL LOW (ref 96–112)
Creatinine, Ser: 0.48 mg/dL — ABNORMAL LOW (ref 0.50–1.10)
GFR calc Af Amer: >90 mL/min (ref >90)
GFR calc non Af Amer: >90 mL/min (ref >90)
Glucose, Bld: 107 mg/dL — ABNORMAL HIGH (ref 70–99)
Potassium: 4 mEq/L (ref 3.5–5.1)
Sodium: 134 mEq/L — ABNORMAL LOW (ref 135–145)
Total Bilirubin: 0.2 mg/dL — ABNORMAL LOW (ref 0.3–1.2)
Total Protein: 6.2 g/dL (ref 6.0–8.3)

## 2011-10-19 LAB — CBC
HCT: 28.9 % — ABNORMAL LOW (ref 36.0–46.0)
Hemoglobin: 9.3 g/dL — ABNORMAL LOW (ref 12.0–15.0)
MCH: 28.7 pg (ref 26.0–34.0)
MCHC: 32.2 g/dL (ref 30.0–36.0)
MCV: 89.2 fL (ref 78.0–100.0)
Platelets: 694 10*3/uL — ABNORMAL HIGH (ref 150–400)
RBC: 3.24 MIL/uL — ABNORMAL LOW (ref 3.87–5.11)
RDW: 13.6 % (ref 11.5–15.5)
WBC: 14.7 10*3/uL — ABNORMAL HIGH (ref 4.0–10.5)

## 2011-10-19 LAB — MAGNESIUM: Magnesium: 1.8 mg/dL (ref 1.5–2.5)

## 2011-10-19 LAB — GLUCOSE, CAPILLARY
Glucose-Capillary: 108 mg/dL — ABNORMAL HIGH (ref 70–99)
Glucose-Capillary: 98 mg/dL (ref 70–99)
Glucose-Capillary: 100 mg/dL — ABNORMAL HIGH (ref 70–99)
Glucose-Capillary: 127 mg/dL — ABNORMAL HIGH (ref 70–99)

## 2011-10-19 MED ORDER — SODIUM CHLORIDE 0.9 % IV BOLUS (SEPSIS)
1000.0000 mL | Freq: Once | INTRAVENOUS | Status: AC
  Administered 2011-10-19: 1000 mL via INTRAVENOUS

## 2011-10-19 NOTE — Progress Notes (Signed)
INFECTIOUS DISEASE PROGRESS NOTE  ID: Stacy Dalton is a 61 y.o. female with   Principal Problem:  *Perforated bowel Active Problems:  Peritonitis  SIRS (systemic inflammatory response syndrome)  COPD (chronic obstructive pulmonary disease)  Hypertension  Chronic back pain  Tobacco use  Fever postop  Migraine  Normocytic anemia  Subjective: Drinking fluids but no appetite. States idea of ensure or boost makes her nauseated. Also c/o throat pain since surgery.    Abtx:  Anti-infectives     Start     Dose/Rate Route Frequency Ordered Stop   10/15/11 1800   ceFEPIme (MAXIPIME) 2 g in dextrose 5 % 50 mL IVPB        2 g 100 mL/hr over 30 Minutes Intravenous Every 12 hours 10/15/11 1710     10/15/11 1800   metroNIDAZOLE (FLAGYL) IVPB 500 mg        500 mg 100 mL/hr over 60 Minutes Intravenous Every 8 hours 10/15/11 1710     10/04/11 2359   piperacillin-tazobactam (ZOSYN) IVPB 3.375 g  Status:  Discontinued        3.375 g 12.5 mL/hr over 240 Minutes Intravenous 3 times per day 10/04/11 1607 10/04/11 2040   10/04/11 2200   ertapenem (INVANZ) 1 g in sodium chloride 0.9 % 50 mL IVPB  Status:  Discontinued        1 g 100 mL/hr over 30 Minutes Intravenous Every 24 hours 10/04/11 2040 10/15/11 1710   10/04/11 2000   vancomycin (VANCOCIN) 750 mg in sodium chloride 0.9 % 150 mL IVPB  Status:  Discontinued        750 mg 150 mL/hr over 60 Minutes Intravenous Every 8 hours 10/04/11 1921 10/05/11 1523   10/04/11 1600  piperacillin-tazobactam (ZOSYN) IVPB 3.375 g       3.375 g 12.5 mL/hr over 240 Minutes Intravenous  Once 10/04/11 1528 10/04/11 1958          Medications:  Scheduled:   . antiseptic oral rinse  15 mL Mouth Rinse BID  . ceFEPime (MAXIPIME) IV  2 g Intravenous Q12H  . heparin  5,000 Units Subcutaneous Q8H  . insulin aspart  0-9 Units Subcutaneous TID WC  . metronidazole  500 mg Intravenous Q8H  . mulitivitamin with minerals  1 tablet Oral Daily  . pantoprazole  (PROTONIX) IV  40 mg Intravenous Q12H  . sodium chloride  1,000 mL Intravenous Once  . sodium chloride  10-40 mL Intracatheter Q12H    Objective: Vital signs in last 24 hours: Temp:  [98.2 F (36.8 C)-100 F (37.8 C)] 98.2 F (36.8 C) (04/18 1417) Pulse Rate:  [89-95] 89  (04/18 1417) Resp:  [12-22] 12  (04/18 1417) BP: (103-107)/(63-68) 103/65 mmHg (04/18 1417) SpO2:  [90 %-99 %] 90 % (04/18 1417)   General appearance: alert, cooperative, no distress and weak Throat: normal findings: soft palate, uvula, and tonsils normal and mild posterior pharyngeal drainage.  Neck: supple, symmetrical, trachea midline and non--tender on R, no abnormality felt.  Resp: clear to auscultation bilaterally Cardio: regular rate and rhythm GI: normal findings: soft and abnormal findings:  hypoactive bowel sounds and tenderness of R, decreased vs previous  Lab Results  [facility] 10/19/11 0625  WBC 14.7*  HGB 9.3*  HCT 28.9*  NA 134*  K 4.0  CL 95*  CO2 31  BUN 6  CREATININE 0.48*  GLU --   Liver Panel  Basename 10/19/11 0625  PROT 6.2  ALBUMIN 2.0*  AST 14  ALT 18  ALKPHOS 77  BILITOT 0.2*  BILIDIR --  IBILI --   Sedimentation Rate No results found for this basename: ESRSEDRATE in the last 72 hours C-Reactive Protein No results found for this basename: CRP:2 in the last 72 hours  Microbiology: Recent Results (from the past 240 hour(s))  CULTURE, BLOOD (ROUTINE X 2)     Status: Normal   Collection Time   10/11/11  4:00 PM      Component Value Range Status Comment   Specimen Description BLOOD RIGHT HAND   Final    Special Requests BOTTLES DRAWN AEROBIC AND ANAEROBIC 10CC   Final    Culture  Setup Time [id number]   Final    Culture NO GROWTH 5 DAYS   Final    Report Status 10/18/2011 FINAL   Final   CULTURE, BLOOD (ROUTINE X 2)     Status: Normal   Collection Time   10/11/11  4:20 PM      Component Value Range Status Comment   Specimen Description BLOOD RIGHT ARM   Final      Special Requests BOTTLES DRAWN AEROBIC AND ANAEROBIC 10CC   Final    Culture  Setup Time [id number]   Final    Culture NO GROWTH 5 DAYS   Final    Report Status 10/18/2011 FINAL   Final   CLOSTRIDIUM DIFFICILE BY PCR     Status: Normal   Collection Time   10/13/11  6:36 PM      Component Value Range Status Comment   C difficile by pcr NEGATIVE  NEGATIVE  Final   URINE CULTURE     Status: Normal   Collection Time   10/14/11  8:19 AM      Component Value Range Status Comment   Specimen Description URINE, CATHETERIZED   Final    Special Requests Invanz   Final    Culture  Setup Time [id number]   Final    Colony Count NO GROWTH   Final    Culture NO GROWTH   Final    Report Status 10/15/2011 FINAL   Final     Studies/Results: Dg Chest 2 View  10/19/2011  *RADIOLOGY REPORT*  Clinical Data: Postop exploratory laparotomy.  Shortness of breath.  CHEST - 2 VIEW  Comparison: 10/11/2011  Findings: Small bilateral pleural effusions with bibasilar atelectasis.  Elevation of the right hemidiaphragm.  Left PICC line is unchanged.  Heart is upper limits normal in size.  IMPRESSION: Small bilateral effusions and bibasilar atelectasis.  No real change.  Original Report Authenticated By: Cyndie Chime, M.D.     Assessment/Plan: Abdominal Perforation  Repaired 4-3  ? HCAP  BCx NGTD, UCx (-), C diff (-)  Cefpime/flagyl day 5, anbx day 15  WBC has come up to 14 today, Tm 100.0 She continues to make slow progress and wants to go home. Continue to watch in hosp til nutrition/strength improved.  Aim for 7-8 days of cefepime/flagyl.    Johny Sax Infectious Diseases [phone] 10/19/2011, 4:13 PM   LOS: 15 days

## 2011-10-19 NOTE — Progress Notes (Signed)
UR complete 

## 2011-10-19 NOTE — Progress Notes (Signed)
Calorie Count Results  4/16 Lunch - 320 calories, 30g protein Dinner - 275 calories, 5g protein  4/17  Breakfast - 315 calories, 13g protein Lunch - 580 calories, 9g protein Dinner - 85 calories (1 bite of dinner and 100% of drink)  4/18 Breakfast - 300 calories, 17g protein Lunch - 650 calories, 28g protein   Average meal intake (with exception of dinner last night) has been 350 calories, 15g protein for a total estimated daily intake of 1050 calories, 45g protein - meets 72% calorie needs, 63% protein needs   - Met with pt who states her appetite is improving, observed she had eaten 100% of lunch tray. Intake of meals has been 75-100% this week except for last night where pt hardly ate any of dinner. Noted MD ordered carnation instant breakfast TID and PRN which will be beneficial for pt. Discussed results of calorie count with pt and encouraged increased intake of protein rich foods and supplement intake. Will order flavor per pt preference. Will continue to monitor intake.   Dietitian # [phone]

## 2011-10-19 NOTE — Progress Notes (Signed)
General Surgery [facility] - Attending  Patient seen and examined.  CXR with bilateral effusions and atelectasis - stable.  Needs to mobilize more.  Continue abx Rx per ID recommendations.  WBC remains elevated at 14K.  Wound care continues.  Encourage increased po diet and ambulation.  Velora Heckler, MD, [facility], [place] [facility] Office: [phone]

## 2011-10-19 NOTE — Progress Notes (Signed)
15 Days Post-Op  Subjective: She still feels puny, no appetite and she's not eating allot.  Doing better with pain.    Objective: Vital signs in last 24 hours: Temp:  [98.2 F (36.8 C)-100 F (37.8 C)] 100 F (37.8 C) (04/18 0530) Pulse Rate:  [90-98] 90  (04/18 0530) Resp:  [14-22] 18  (04/18 0530) BP: (102-107)/(63-68) 107/68 mmHg (04/18 0530) SpO2:  [91 %-99 %] 91 % (04/18 0530) Last BM Date:  (Patient has ostomy) NO colostomy output being recorded, she is on a regular diet, still having occasional fever up to 100. I/O is negative. Labs OK Intake/Output from previous day: 04/17 0701 - 04/18 0700 In: 400 [P.O.:400] Out: 1850 [Urine:1850] Intake/Output this shift: Total I/O In: 180 [P.O.:180] Out: 200 [Urine:200]  General appearance: alert, appears stated age and no distress GI: soft, +BS, taking po's colostomy working well.  Incision is open, sutures at the base are loose. Still a little soupy.    Lab Results:   [facility] 10/19/11 0625  WBC 14.7*  HGB 9.3*  HCT 28.9*  PLT 694*    BMET  Basename 10/19/11 0625  NA 134*  K 4.0  CL 95*  CO2 31  GLUCOSE 107*  BUN 6  CREATININE 0.48*  CALCIUM 9.0   PT/INR No results found for this basename: LABPROT:2,INR:2 in the last 72 hours   Lab 10/19/11 0625 10/16/11 0430 10/14/11 0510 10/13/11 0500  AST 14 20 25  36  ALT 18 38* 53* 68*  ALKPHOS 77 78 75 73  BILITOT 0.2* 0.2* 0.2* 0.2*  PROT 6.2 6.1 5.7* 5.5*  ALBUMIN 2.0* 1.9* 1.8* 1.7*     Lipase     Component Value Date/Time   LIPASE 20 10/04/2011 1345     Studies/Results: No results found.  Medications:    . antiseptic oral rinse  15 mL Mouth Rinse BID  . ceFEPime (MAXIPIME) IV  2 g Intravenous Q12H  . heparin  5,000 Units Subcutaneous Q8H  . insulin aspart  0-9 Units Subcutaneous TID WC  . metronidazole  500 mg Intravenous Q8H  . mulitivitamin with minerals  1 tablet Oral Daily  . pantoprazole (PROTONIX) IV  40 mg Intravenous Q12H  . sodium chloride   10-40 mL Intracatheter Q12H  . 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 Derrell Lolling  Post op fever/tachycardia/hypotension  Post op nausea, pain and fever ? SIRS systemic inflammatory response syndrome?  CT scan 10/09/11: ascites, mesenteric edema, pleural effusions, no abscess noted.  Migraine  Hypertension  CHRONIC BACK PAIN she uses hydrocodone almost daily  HISTORY OF TOBACCO USE/NONE FOR 13 YEARS    Plan: appreciate Dr. Moshe Cipro help with antibiotics.  Will work on increasing PO, she likes the [employer], she can use some of that.  Doesn't like the Ensure and Boost products.  I will give her some extra fluid IV today.  She's making slow progress.  Note from husband he does not want Home Health.  Sats down with ambulation, will check CXR today also.    LOS: 15 days    Lurlean Kernen 10/19/2011

## 2011-10-19 NOTE — Consult Note (Signed)
WOC ostomy follow up Stoma type/location: LLQ, end colostomy Stomal assessment/size: slightly oval stoma at 1 3/8" x 1 1/2", pink and moist, slightly budded above skin level, dip at 10-11 o'clock  Peristomal assessment:  Intact without problems Treatment options for stomal/peristomal skin: utilized convex 1pc drainable pouch as stoma is only slightly budded and has above dip in the skin. Output paste light brown, moderate amount 1/3 full when I arrived. Reviewed need to empty when 1/3 to 1/2 full. Ostomy pouching: 1pc. Convex pouch placed Education provided: Met for teaching session with pt and her husband.  Pts husband has been LPN in the [employer] in the past.  Reviewed all aspects of ostomy care with pt and husband. Pt was able to view stoma today and was able to independently close pouch with lock and roll closure, she had been reluctant at my last visit to even look.  Husband and I re measured stoma and husband was independent with cutting of new pouch.  Reminded pt not to use any cleansers or wipes on the peristomal skin.  Husband was able with minimal cuing to place pouch on pt, and secure.  Husband is independent with opening and closure with lock and roll closure.  Extra supplies in the pts room.  WOC will follow along with you  Stacy Dalton Marlena Clipper, CWOCN [phone]

## 2011-10-20 LAB — GLUCOSE, CAPILLARY
Glucose-Capillary: 136 mg/dL — ABNORMAL HIGH (ref 70–99)
Glucose-Capillary: 105 mg/dL — ABNORMAL HIGH (ref 70–99)
Glucose-Capillary: 117 mg/dL — ABNORMAL HIGH (ref 70–99)
Glucose-Capillary: 133 mg/dL — ABNORMAL HIGH (ref 70–99)

## 2011-10-20 NOTE — Progress Notes (Signed)
INFECTIOUS DISEASE PROGRESS NOTE  ID: Stacy Dalton is a 61 y.o. female with   Principal Problem:  *Perforated bowel Active Problems:  Peritonitis  SIRS (systemic inflammatory response syndrome)  COPD (chronic obstructive pulmonary disease)  Hypertension  Chronic back pain  Tobacco use  Fever postop  Migraine  Normocytic anemia  Subjective: Feels weak, appetite worse than yesterday.   Abtx:  Anti-infectives     Start     Dose/Rate Route Frequency Ordered Stop   10/15/11 1800   ceFEPIme (MAXIPIME) 2 g in dextrose 5 % 50 mL IVPB        2 g 100 mL/hr over 30 Minutes Intravenous Every 12 hours 10/15/11 1710     10/15/11 1800   metroNIDAZOLE (FLAGYL) IVPB 500 mg        500 mg 100 mL/hr over 60 Minutes Intravenous Every 8 hours 10/15/11 1710     10/04/11 2359   piperacillin-tazobactam (ZOSYN) IVPB 3.375 g  Status:  Discontinued        3.375 g 12.5 mL/hr over 240 Minutes Intravenous 3 times per day 10/04/11 1607 10/04/11 2040   10/04/11 2200   ertapenem (INVANZ) 1 g in sodium chloride 0.9 % 50 mL IVPB  Status:  Discontinued        1 g 100 mL/hr over 30 Minutes Intravenous Every 24 hours 10/04/11 2040 10/15/11 1710   10/04/11 2000   vancomycin (VANCOCIN) 750 mg in sodium chloride 0.9 % 150 mL IVPB  Status:  Discontinued        750 mg 150 mL/hr over 60 Minutes Intravenous Every 8 hours 10/04/11 1921 10/05/11 1523   10/04/11 1600  piperacillin-tazobactam (ZOSYN) IVPB 3.375 g       3.375 g 12.5 mL/hr over 240 Minutes Intravenous  Once 10/04/11 1528 10/04/11 1958          Medications:  Scheduled:   . antiseptic oral rinse  15 mL Mouth Rinse BID  . ceFEPime (MAXIPIME) IV  2 g Intravenous Q12H  . heparin  5,000 Units Subcutaneous Q8H  . insulin aspart  0-9 Units Subcutaneous TID WC  . metronidazole  500 mg Intravenous Q8H  . mulitivitamin with minerals  1 tablet Oral Daily  . pantoprazole (PROTONIX) IV  40 mg Intravenous Q12H  . sodium chloride  1,000 mL Intravenous  Once  . sodium chloride  10-40 mL Intracatheter Q12H    Objective: Vital signs in last 24 hours: Temp:  [98.4 F (36.9 C)-99.7 F (37.6 C)] 99.5 F (37.5 C) (04/19 1356) Pulse Rate:  [92-96] 92  (04/19 1356) Resp:  [16-18] 18  (04/19 1356) BP: (104-116)/(53-77) 104/67 mmHg (04/19 1356) SpO2:  [90 %-99 %] 99 % (04/19 1356)   General appearance: alert, cooperative and no distress Resp: clear to auscultation bilaterally Cardio: regular rate and rhythm GI: mild-mod distension, mild tenderness, no gaurding, decreased bowel sounds  Lab Results  [facility] 10/19/11 0625  WBC 14.7*  HGB 9.3*  HCT 28.9*  NA 134*  K 4.0  CL 95*  CO2 31  BUN 6  CREATININE 0.48*  GLU --   Liver Panel  Basename 10/19/11 0625  PROT 6.2  ALBUMIN 2.0*  AST 14  ALT 18  ALKPHOS 77  BILITOT 0.2*  BILIDIR --  IBILI --   Sedimentation Rate No results found for this basename: ESRSEDRATE in the last 72 hours C-Reactive Protein No results found for this basename: CRP:2 in the last 72 hours  Microbiology: Recent Results (from the past 240  hour(s))  CULTURE, BLOOD (ROUTINE X 2)     Status: Normal   Collection Time   10/11/11  4:00 PM      Component Value Range Status Comment   Specimen Description BLOOD RIGHT HAND   Final    Special Requests BOTTLES DRAWN AEROBIC AND ANAEROBIC 10CC   Final    Culture  Setup Time [id number]   Final    Culture NO GROWTH 5 DAYS   Final    Report Status 10/18/2011 FINAL   Final   CULTURE, BLOOD (ROUTINE X 2)     Status: Normal   Collection Time   10/11/11  4:20 PM      Component Value Range Status Comment   Specimen Description BLOOD RIGHT ARM   Final    Special Requests BOTTLES DRAWN AEROBIC AND ANAEROBIC 10CC   Final    Culture  Setup Time [id number]   Final    Culture NO GROWTH 5 DAYS   Final    Report Status 10/18/2011 FINAL   Final   CLOSTRIDIUM DIFFICILE BY PCR     Status: Normal   Collection Time   10/13/11  6:36 PM      Component Value Range Status  Comment   C difficile by pcr NEGATIVE  NEGATIVE  Final   URINE CULTURE     Status: Normal   Collection Time   10/14/11  8:19 AM      Component Value Range Status Comment   Specimen Description URINE, CATHETERIZED   Final    Special Requests Invanz   Final    Culture  Setup Time [id number]   Final    Colony Count NO GROWTH   Final    Culture NO GROWTH   Final    Report Status 10/15/2011 FINAL   Final     Studies/Results: Dg Chest 2 View  10/19/2011  *RADIOLOGY REPORT*  Clinical Data: Postop exploratory laparotomy.  Shortness of breath.  CHEST - 2 VIEW  Comparison: 10/11/2011  Findings: Small bilateral pleural effusions with bibasilar atelectasis.  Elevation of the right hemidiaphragm.  Left PICC line is unchanged.  Heart is upper limits normal in size.  IMPRESSION: Small bilateral effusions and bibasilar atelectasis.  No real change.  Original Report Authenticated By: Cyndie Chime, M.D.     Assessment/Plan: Abdominal Perforation  Repaired 4-3  ? HCAP   Cefpime/flagyl day 6, anbx day 16 WBC  still elevated Albumin quite low Aim to stop cefepime/flagyl on 4-22 ID available if q's over w/e.    Johny Sax Infectious Diseases [phone] 10/20/2011, 4:31 PM   LOS: 16 days

## 2011-10-20 NOTE — Progress Notes (Signed)
16 Days Post-Op  Subjective: Looks better some nausea with breakfast.  Objective: Vital signs in last 24 hours: Temp:  [98.2 F (36.8 C)-99.7 F (37.6 C)] 99.7 F (37.6 C) (04/19 0605) Pulse Rate:  [89-96] 92  (04/19 0605) Resp:  [12-16] 16  (04/19 0605) BP: (103-116)/(53-77) 116/77 mmHg (04/19 0605) SpO2:  [90 %-95 %] 90 % (04/19 0605) Last BM Date: 10/20/11  Still running low grade fevers 99-100 range,  Intake/Output from previous day: 04/18 0701 - 04/19 0700 In: 780 [P.O.:660; I.V.:120] Out: 3900 [Urine:3300; Stool:600] Intake/Output this shift: Total I/O In: 240 [P.O.:240] Out: 350 [Urine:350]  General appearance: alert, cooperative and no distress Resp: clear to auscultation bilaterally GI: soft, non-tender; bowel sounds normal; no masses,  no organomegaly and open wound looks good, more granulation tissue, not soupy like earlier in the week.  Lab Results:   [facility] 10/19/11 0625  WBC 14.7*  HGB 9.3*  HCT 28.9*  PLT 694*    BMET  Basename 10/19/11 0625  NA 134*  K 4.0  CL 95*  CO2 31  GLUCOSE 107*  BUN 6  CREATININE 0.48*  CALCIUM 9.0   PT/INR No results found for this basename: LABPROT:2,INR:2 in the last 72 hours   Lab 10/19/11 0625 10/16/11 0430 10/14/11 0510  AST 14 20 25   ALT 18 38* 53*  ALKPHOS 77 78 75  BILITOT 0.2* 0.2* 0.2*  PROT 6.2 6.1 5.7*  ALBUMIN 2.0* 1.9* 1.8*     Lipase     Component Value Date/Time   LIPASE 20 10/04/2011 1345     Studies/Results: Dg Chest 2 View  10/19/2011  *RADIOLOGY REPORT*  Clinical Data: Postop exploratory laparotomy.  Shortness of breath.  CHEST - 2 VIEW  Comparison: 10/11/2011  Findings: Small bilateral pleural effusions with bibasilar atelectasis.  Elevation of the right hemidiaphragm.  Left PICC line is unchanged.  Heart is upper limits normal in size.  IMPRESSION: Small bilateral effusions and bibasilar atelectasis.  No real change.  Original Report Authenticated By: Cyndie Chime, M.D.     Medications:    . antiseptic oral rinse  15 mL Mouth Rinse BID  . ceFEPime (MAXIPIME) IV  2 g Intravenous Q12H  . heparin  5,000 Units Subcutaneous Q8H  . insulin aspart  0-9 Units Subcutaneous TID WC  . metronidazole  500 mg Intravenous Q8H  . mulitivitamin with minerals  1 tablet Oral Daily  . pantoprazole (PROTONIX) IV  40 mg Intravenous Q12H  . sodium chloride  1,000 mL Intravenous Once  . 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 Derrell Lolling  Post op fever/tachycardia/hypotension  Post op nausea, pain and fever ? SIRS systemic inflammatory response syndrome?  CT scan 10/09/11: ascites, mesenteric edema, pleural effusions, no abscess noted.  Migraine  Hypertension  CHRONIC BACK PAIN she uses hydrocodone almost daily  HISTORY OF TOBACCO USE/NONE FOR 13 YEARS    Plan:  Continue antibiotics and wet to dry dressing.  Encourage PO intake, ambulation, start working more with colostomy.  If her fevers resolve, and po intake improves we may be able to send home next week  LOS: 16 days    Stacy Dalton 10/20/2011

## 2011-10-20 NOTE — Progress Notes (Signed)
Occupational Therapy Treatment Patient Details Name: Stacy Dalton MRN: [id number] DOB: 12-30-1950 Today's Date: 10/20/2011 Time: 1448-1500 OT Time Calculation (min): 12 min  OT Assessment / Plan / Recommendation Comments on Treatment Session Pt continuing to make slow, steady progress.    Follow Up Recommendations  Home health OT    Equipment Recommendations  3 in 1 bedside comode    Frequency Min 2X/week   Plan Discharge plan remains appropriate    Precautions / Restrictions Precautions Precautions: Fall Other Brace/Splint: abdominal binder-stated MD told her she didnt have to wear it.   Pertinent Vitals/Pain     ADL  Grooming: Performed;Wash/dry hands;Brushing hair;Supervision/safety Where Assessed - Grooming: Standing at sink Upper Body Dressing: Performed;Supervision/safety Where Assessed - Upper Body Dressing: Standing Toilet Transfer: Performed;Supervision/safety Toilet Transfer Method: Proofreader: Regular height toilet Toileting - Clothing Manipulation: Performed;Supervision/safety Where Assessed - Toileting Clothing Manipulation: Sit to stand from 3-in-1 or toilet Toileting - Hygiene: Performed;Supervision/safety Where Assessed - Toileting Hygiene: Sit to stand from 3-in-1 or toilet ADL Comments: O2 saturation 94% on RA following standing activity in the bathroom.    OT Goals ADL Goals Pt Will Perform Grooming: with modified independence ADL Goal: Grooming - Progress: Updated due to goal met Pt Will Transfer to Toilet: with modified independence ADL Goal: Toilet Transfer - Progress: Updated due to goal met Pt Will Perform Toileting - Clothing Manipulation: with modified independence ADL Goal: Toileting - Clothing Manipulation - Progress: Updated due to goal met Pt Will Perform Toileting - Hygiene: with modified independence ADL Goal: Toileting - Hygiene - Progress: Updated due to goal met  Visit Information  Last OT Received On:  10/20/11 Assistance Needed: +1    Subjective Data  Subjective: "I've been walking around the room & I did my own bath today."   Prior Functioning       Cognition  Overall Cognitive Status: Appears within functional limits for tasks assessed/performed Arousal/Alertness: Awake/alert Orientation Level: Appears intact for tasks assessed Behavior During Session: [facility] for tasks performed    Mobility Bed Mobility Supine to Sit: 6: Modified independent (Device/Increase time);HOB flat Transfers Sit to Stand: 5: Supervision;With upper extremity assist;From bed;From toilet Stand to Sit: 5: Supervision;With upper extremity assist;With armrests;To chair/3-in-1;To toilet Details for Transfer Assistance: Pt only required 1 VC for hand placement when standing.   Exercises    Balance    End of Session OT - End of Session Activity Tolerance: Patient tolerated treatment well Patient left: in chair;with call bell/phone within reach   Raiven Belizaire A OTR/L [phone] 10/20/2011, 3:17 PM

## 2011-10-20 NOTE — Progress Notes (Signed)
General Surgery [facility] - Attending  Patient seen and examined.  Wound examined with dressing change.  Some fascial separation inferiorly.  Otherwise clean and granulating.  Continue wound care.  Remain on abx per ID consultant.  Ambulatory.  Velora Heckler, MD, [facility] Office: [phone]

## 2011-10-20 NOTE — Progress Notes (Signed)
Physical Therapy Treatment Patient Details Name: Stacy Dalton MRN: [id number] DOB: 1951/06/01 Today's Date: 10/20/2011 Time:  1520- 1543    PT Assessment / Plan / Recommendation Comments on Treatment Session  Pt continuing to progress well but slowly with mobility. O2 sats 90% at rest, 95% after ambulation.     Follow Up Recommendations  Home health PT (pt's husband refusing?)    Equipment Recommendations  3 in 1 bedside comode    Frequency     Plan Discharge plan remains appropriate    Precautions / Restrictions Precautions Precautions: Fall Other Brace/Splint: abdominal binder-stated MD told her she didnt have to wear it.   Pertinent Vitals/Pain     Mobility  Bed Mobility Bed Mobility: Sit to Supine Supine to Sit: 6: Modified independent (Device/Increase time);HOB flat Sit to Supine: HOB elevated;With rail;5: Supervision Transfers Transfers: Sit to Stand;Stand to Sit Sit to Stand: 5: Supervision Stand to Sit: 5: Supervision Details for Transfer Assistance: VCs safety. Ambulation/Gait Ambulation/Gait Assistance: 4: Min assist Ambulation Distance (Feet): 160 Feet Assistive device: Rolling walker Ambulation/Gait Assistance Details: Slow gait speed. Weaving with RW.  Gait Pattern: Step-through pattern;Trunk flexed    Exercises     PT Goals Acute Rehab PT Goals PT Goal: Sit to Supine/Side - Progress: Progressing toward goal PT Goal: Sit to Stand - Progress: Progressing toward goal PT Goal: Stand to Sit - Progress: Progressing toward goal PT Goal: Ambulate - Progress: Progressing toward goal  Visit Information  Last PT Received On: 10/20/11 Assistance Needed: +1    Subjective Data  Subjective: "I'm huring kind of bad" Patient Stated Goal: Less pain   Cognition  Overall Cognitive Status: Appears within functional limits for tasks assessed/performed Arousal/Alertness: Awake/alert Orientation Level: Appears intact for tasks assessed Behavior During  Session: [facility] for tasks performed    Balance     End of Session PT - End of Session Equipment Utilized During Treatment: Gait belt Activity Tolerance: Patient limited by pain Patient left: in bed;with call bell/phone within reach    Rebeca Alert [facility] 10/20/2011, 3:55 PM [phone]

## 2011-10-21 LAB — GLUCOSE, CAPILLARY
Glucose-Capillary: 113 mg/dL — ABNORMAL HIGH (ref 70–99)
Glucose-Capillary: 147 mg/dL — ABNORMAL HIGH (ref 70–99)
Glucose-Capillary: 109 mg/dL — ABNORMAL HIGH (ref 70–99)
Glucose-Capillary: 127 mg/dL — ABNORMAL HIGH (ref 70–99)

## 2011-10-21 MED ORDER — HYDROCODONE-ACETAMINOPHEN 5-325 MG PO TABS
2.0000 | ORAL_TABLET | Freq: Four times a day (QID) | ORAL | Status: DC | PRN
  Administered 2011-10-22 – 2011-10-24 (×11): 2 via ORAL
  Filled 2011-10-21 (×11): qty 2

## 2011-10-21 MED ORDER — HYDROCODONE-ACETAMINOPHEN 5-325 MG PO TABS
1.0000 | ORAL_TABLET | ORAL | Status: DC | PRN
  Administered 2011-10-21 (×2): 1 via ORAL
  Filled 2011-10-21 (×2): qty 1

## 2011-10-21 NOTE — Progress Notes (Signed)
Pt requesting to go back to schedule of pain medicine prior to am rounding MD changing to Q4hrs.  Notified Dr. Gerrit Friends and new order received.  Informed patient of change in pain medicine schedule.

## 2011-10-21 NOTE — Progress Notes (Signed)
Changed dressing at 930 Annitta Needs, RN

## 2011-10-21 NOTE — Progress Notes (Signed)
17 Days Post-Op  Subjective: No new issues. Tolerating diet  Objective: Vital signs in last 24 hours: Temp:  [98.5 F (36.9 C)-99.5 F (37.5 C)] 98.8 F (37.1 C) (04/20 0948) Pulse Rate:  [86-95] 91  (04/20 0948) Resp:  [16-18] 16  (04/20 0948) BP: (101-125)/(53-74) 109/73 mmHg (04/20 0948) SpO2:  [92 %-99 %] 92 % (04/20 0948) Last BM Date: 10/20/11  Intake/Output from previous day: 04/19 0701 - 04/20 0700 In: 1800 [P.O.:1320; I.V.:480] Out: 2350 [Urine:1700; Stool:650] Intake/Output this shift: Total I/O In: 120 [P.O.:120] Out: 450 [Urine:450]  General appearance: alert, cooperative and no distress Resp: nonlabored Cardio: normal rate, regular GI: soft, nd, wound healing well, healthy granulation tissue, ostomy working  Lab Results:   Basename 10/19/11 0625  WBC 14.7*  HGB 9.3*  HCT 28.9*  PLT 694*   BMET  Basename 10/19/11 0625  NA 134*  K 4.0  CL 95*  CO2 31  GLUCOSE 107*  BUN 6  CREATININE 0.48*  CALCIUM 9.0   PT/INR No results found for this basename: LABPROT:2,INR:2 in the last 72 hours ABG No results found for this basename: PHART:2,PCO2:2,PO2:2,HCO3:2 in the last 72 hours  Studies/Results: Dg Chest 2 View  10/19/2011  *RADIOLOGY REPORT*  Clinical Data: Postop exploratory laparotomy.  Shortness of breath.  CHEST - 2 VIEW  Comparison: 10/11/2011  Findings: Small bilateral pleural effusions with bibasilar atelectasis.  Elevation of the right hemidiaphragm.  Left PICC line is unchanged.  Heart is upper limits normal in size.  IMPRESSION: Small bilateral effusions and bibasilar atelectasis.  No real change.  Original Report Authenticated By: Cyndie Chime, M.D.    Anti-infectives: Anti-infectives     Start     Dose/Rate Route Frequency Ordered Stop   10/15/11 1800   ceFEPIme (MAXIPIME) 2 g in dextrose 5 % 50 mL IVPB        2 g 100 mL/hr over 30 Minutes Intravenous Every 12 hours 10/15/11 1710     10/15/11 1800   metroNIDAZOLE (FLAGYL) IVPB 500 mg         500 mg 100 mL/hr over 60 Minutes Intravenous Every 8 hours 10/15/11 1710     10/04/11 2359   piperacillin-tazobactam (ZOSYN) IVPB 3.375 g  Status:  Discontinued        3.375 g 12.5 mL/hr over 240 Minutes Intravenous 3 times per day 10/04/11 1607 10/04/11 2040   10/04/11 2200   ertapenem (INVANZ) 1 g in sodium chloride 0.9 % 50 mL IVPB  Status:  Discontinued        1 g 100 mL/hr over 30 Minutes Intravenous Every 24 hours 10/04/11 2040 10/15/11 1710   10/04/11 2000   vancomycin (VANCOCIN) 750 mg in sodium chloride 0.9 % 150 mL IVPB  Status:  Discontinued        750 mg 150 mL/hr over 60 Minutes Intravenous Every 8 hours 10/04/11 1921 10/05/11 1523   10/04/11 1600   piperacillin-tazobactam (ZOSYN) IVPB 3.375 g        3.375 g 12.5 mL/hr over 240 Minutes Intravenous  Once 10/04/11 1528 10/04/11 1958          Assessment/Plan: s/p Procedure(s) (LRB): EXPLORATORY LAPAROTOMY (N/A) she seems to be doing okay.  no new issues.    LOS: 17 days    Lodema Pilot DAVID 10/21/2011

## 2011-10-22 LAB — GLUCOSE, CAPILLARY
Glucose-Capillary: 106 mg/dL — ABNORMAL HIGH (ref 70–99)
Glucose-Capillary: 107 mg/dL — ABNORMAL HIGH (ref 70–99)
Glucose-Capillary: 108 mg/dL — ABNORMAL HIGH (ref 70–99)
Glucose-Capillary: 114 mg/dL — ABNORMAL HIGH (ref 70–99)

## 2011-10-22 MED ORDER — PANTOPRAZOLE SODIUM 40 MG PO TBEC
40.0000 mg | DELAYED_RELEASE_TABLET | Freq: Two times a day (BID) | ORAL | Status: DC
  Administered 2011-10-22 – 2011-10-24 (×4): 40 mg via ORAL
  Filled 2011-10-22 (×6): qty 1

## 2011-10-22 NOTE — Progress Notes (Signed)
18 Days Post-Op  Subjective: No new issues.  Tolerating diet. Objective: Vital signs in last 24 hours: Temp:  [97.4 F (36.3 C)-98.2 F (36.8 C)] 98.2 F (36.8 C) (04/21 0509) Pulse Rate:  [89-90] 89  (04/21 0509) Resp:  [18-20] 20  (04/21 0509) BP: (117-134)/(72-76) 134/76 mmHg (04/21 0509) SpO2:  [91 %-96 %] 92 % (04/21 0509) Last BM Date: 10/21/11  Intake/Output from previous day: 04/20 0701 - 04/21 0700 In: 920 [P.O.:720; IV Piggyback:200] Out: 2300 [Urine:1550; Stool:750] Intake/Output this shift:    General appearance: alert, cooperative and no distress GI: ND, no apparent tenderness, wound without infection, ostomy working  Lab Results:  No results found for this basename: WBC:2,HGB:2,HCT:2,PLT:2 in the last 72 hours BMET No results found for this basename: NA:2,K:2,CL:2,CO2:2,GLUCOSE:2,BUN:2,CREATININE:2,CALCIUM:2 in the last 72 hours PT/INR No results found for this basename: LABPROT:2,INR:2 in the last 72 hours ABG No results found for this basename: PHART:2,PCO2:2,PO2:2,HCO3:2 in the last 72 hours  Studies/Results: No results found.  Anti-infectives: Anti-infectives     Start     Dose/Rate Route Frequency Ordered Stop   10/15/11 1800   ceFEPIme (MAXIPIME) 2 g in dextrose 5 % 50 mL IVPB        2 g 100 mL/hr over 30 Minutes Intravenous Every 12 hours 10/15/11 1710     10/15/11 1800   metroNIDAZOLE (FLAGYL) IVPB 500 mg        500 mg 100 mL/hr over 60 Minutes Intravenous Every 8 hours 10/15/11 1710     10/04/11 2359   piperacillin-tazobactam (ZOSYN) IVPB 3.375 g  Status:  Discontinued        3.375 g 12.5 mL/hr over 240 Minutes Intravenous 3 times per day 10/04/11 1607 10/04/11 2040   10/04/11 2200   ertapenem (INVANZ) 1 g in sodium chloride 0.9 % 50 mL IVPB  Status:  Discontinued        1 g 100 mL/hr over 30 Minutes Intravenous Every 24 hours 10/04/11 2040 10/15/11 1710   10/04/11 2000   vancomycin (VANCOCIN) 750 mg in sodium chloride 0.9 % 150 mL IVPB   Status:  Discontinued        750 mg 150 mL/hr over 60 Minutes Intravenous Every 8 hours 10/04/11 1921 10/05/11 1523   10/04/11 1600   piperacillin-tazobactam (ZOSYN) IVPB 3.375 g        3.375 g 12.5 mL/hr over 240 Minutes Intravenous  Once 10/04/11 1528 10/04/11 1958          Assessment/Plan: s/p Procedure(s) (LRB): EXPLORATORY LAPAROTOMY (N/A) she is doing fine and has one more day of IV abx remaining per ID.  If still doing okay, she should be okay for discharge tomorrow with home wound care.  LOS: 18 days    Lodema Pilot DAVID 10/22/2011

## 2011-10-22 NOTE — Progress Notes (Signed)
This patient is receiving IV Protonix. Based on criteria approved by the Pharmacy and Therapeutics Committee, this medication is being converted to the equivalent oral dose form. These criteria include: . The patient is eating (either orally or per tube) and/or has been taking other orally administered medications for at least 24 hours. . This patient has no evidence of active gastrointestinal bleeding or impaired GI absorption (gastrectomy, short bowel, patient on TNA or NPO).  If you have questions about this conversion, please contact the pharmacy department. Thank you.  Clance Boll, PharmD, BCPS Pager: [phone] 10/22/2011 1:55 PM

## 2011-10-23 ENCOUNTER — Inpatient Hospital Stay (HOSPITAL_COMMUNITY)

## 2011-10-23 LAB — COMPREHENSIVE METABOLIC PANEL
ALT: 11 U/L (ref 0–35)
AST: 16 U/L (ref 0–37)
Albumin: 2.2 g/dL — ABNORMAL LOW (ref 3.5–5.2)
Alkaline Phosphatase: 74 U/L (ref 39–117)
BUN: 7 mg/dL (ref 6–23)
CO2: 27 mEq/L (ref 19–32)
Calcium: 8.7 mg/dL (ref 8.4–10.5)
Chloride: 103 mEq/L (ref 96–112)
Creatinine, Ser: 0.49 mg/dL — ABNORMAL LOW (ref 0.50–1.10)
GFR calc Af Amer: >90 mL/min (ref >90)
GFR calc non Af Amer: >90 mL/min (ref >90)
Glucose, Bld: 103 mg/dL — ABNORMAL HIGH (ref 70–99)
Potassium: 3.9 mEq/L (ref 3.5–5.1)
Sodium: 138 mEq/L (ref 135–145)
Total Bilirubin: 0.2 mg/dL — ABNORMAL LOW (ref 0.3–1.2)
Total Protein: 6.5 g/dL (ref 6.0–8.3)

## 2011-10-23 LAB — CBC
HCT: 30.8 % — ABNORMAL LOW (ref 36.0–46.0)
Hemoglobin: 10 g/dL — ABNORMAL LOW (ref 12.0–15.0)
MCH: 29 pg (ref 26.0–34.0)
MCHC: 32.5 g/dL (ref 30.0–36.0)
MCV: 89.3 fL (ref 78.0–100.0)
Platelets: 517 10*3/uL — ABNORMAL HIGH (ref 150–400)
RBC: 3.45 MIL/uL — ABNORMAL LOW (ref 3.87–5.11)
RDW: 14.3 % (ref 11.5–15.5)
WBC: 11.8 10*3/uL — ABNORMAL HIGH (ref 4.0–10.5)

## 2011-10-23 LAB — GLUCOSE, CAPILLARY
Glucose-Capillary: 112 mg/dL — ABNORMAL HIGH (ref 70–99)
Glucose-Capillary: 103 mg/dL — ABNORMAL HIGH (ref 70–99)
Glucose-Capillary: 93 mg/dL (ref 70–99)
Glucose-Capillary: 126 mg/dL — ABNORMAL HIGH (ref 70–99)

## 2011-10-23 NOTE — Progress Notes (Signed)
Patient's husband called again today to restate that he does not want HH services, he feels he will be able to manage at home as he was previously an LPN with the [employer]. He asked about supplies, I have directed him to several local [employer] stores, ostomy supplies were sent to him by the ostomy nurse.

## 2011-10-23 NOTE — Progress Notes (Signed)
INFECTIOUS DISEASE PROGRESS NOTE  ID: Stacy Dalton is a 61 y.o. female with   Principal Problem:  *Perforated bowel Active Problems:  Peritonitis  SIRS (systemic inflammatory response syndrome)  COPD (chronic obstructive pulmonary disease)  Hypertension  Chronic back pain  Tobacco use  Fever postop  Migraine  Normocytic anemia  Subjective: Feels better, ate well today.   Abtx:  Anti-infectives     Start     Dose/Rate Route Frequency Ordered Stop   10/15/11 1800   ceFEPIme (MAXIPIME) 2 g in dextrose 5 % 50 mL IVPB        2 g 100 mL/hr over 30 Minutes Intravenous Every 12 hours 10/15/11 1710     10/15/11 1800   metroNIDAZOLE (FLAGYL) IVPB 500 mg        500 mg 100 mL/hr over 60 Minutes Intravenous Every 8 hours 10/15/11 1710     10/04/11 2359   piperacillin-tazobactam (ZOSYN) IVPB 3.375 g  Status:  Discontinued        3.375 g 12.5 mL/hr over 240 Minutes Intravenous 3 times per day 10/04/11 1607 10/04/11 2040   10/04/11 2200   ertapenem (INVANZ) 1 g in sodium chloride 0.9 % 50 mL IVPB  Status:  Discontinued        1 g 100 mL/hr over 30 Minutes Intravenous Every 24 hours 10/04/11 2040 10/15/11 1710   10/04/11 2000   vancomycin (VANCOCIN) 750 mg in sodium chloride 0.9 % 150 mL IVPB  Status:  Discontinued        750 mg 150 mL/hr over 60 Minutes Intravenous Every 8 hours 10/04/11 1921 10/05/11 1523   10/04/11 1600  piperacillin-tazobactam (ZOSYN) IVPB 3.375 g       3.375 g 12.5 mL/hr over 240 Minutes Intravenous  Once 10/04/11 1528 10/04/11 1958          Medications:  Scheduled:   . antiseptic oral rinse  15 mL Mouth Rinse BID  . ceFEPime (MAXIPIME) IV  2 g Intravenous Q12H  . heparin  5,000 Units Subcutaneous Q8H  . insulin aspart  0-9 Units Subcutaneous TID WC  . metronidazole  500 mg Intravenous Q8H  . mulitivitamin with minerals  1 tablet Oral Daily  . pantoprazole  40 mg Oral BID AC  . sodium chloride  10-40 mL Intracatheter Q12H    Objective: Vital  signs in last 24 hours: Temp:  [98.4 F (36.9 C)-100.2 F (37.9 C)] 98.4 F (36.9 C) (04/22 1404) Pulse Rate:  [92-99] 96  (04/22 1404) Resp:  [18] 18  (04/22 1404) BP: (117-136)/(64-80) 130/80 mmHg (04/22 1404) SpO2:  [94 %-95 %] 94 % (04/22 1404)   General appearance: alert, cooperative and no distress Resp: clear to auscultation bilaterally Cardio: regular rate and rhythm GI: normal findings: bowel sounds normal, soft, non-tender and ostomy site dressed.  Extremities: edema trace  Lab Results  [facility] 10/23/11 0815  WBC 11.8*  HGB 10.0*  HCT 30.8*  NA 138  K 3.9  CL 103  CO2 27  BUN 7  CREATININE 0.49*  GLU --   Liver Panel  Basename 10/23/11 0815  PROT 6.5  ALBUMIN 2.2*  AST 16  ALT 11  ALKPHOS 74  BILITOT 0.2*  BILIDIR --  IBILI --   Sedimentation Rate No results found for this basename: ESRSEDRATE in the last 72 hours C-Reactive Protein No results found for this basename: CRP:2 in the last 72 hours  Microbiology: Recent Results (from the past 240 hour(s))  CLOSTRIDIUM DIFFICILE BY  PCR     Status: Normal   Collection Time   10/13/11  6:36 PM      Component Value Range Status Comment   C difficile by pcr NEGATIVE  NEGATIVE  Final   URINE CULTURE     Status: Normal   Collection Time   10/14/11  8:19 AM      Component Value Range Status Comment   Specimen Description URINE, CATHETERIZED   Final    Special Requests Invanz   Final    Culture  Setup Time [id number]   Final    Colony Count NO GROWTH   Final    Culture NO GROWTH   Final    Report Status 10/15/2011 FINAL   Final     Studies/Results: No results found.   Assessment/Plan: Abdominal Perforation  Repaired 4-3  ? HCAP  Cefpime/flagyl day 9, anbx day 19  Rpeat CT scan is preliminarily no abscess, expected post-op changes. Small amt of fluid L peri-colic gutter ([id number]).  Will stop her anbx. available if needed.      Johny Sax Infectious Diseases [phone] 10/23/2011, 2:46  PM   LOS: 19 days

## 2011-10-23 NOTE — Progress Notes (Signed)
Physical Therapy Treatment Patient Details Name: Stacy Dalton MRN: [id number] DOB: [birth year] Today's Date: 10/23/2011 Time: 1308-6578 PT Time Calculation (min): 9 min  PT Assessment / Plan / Recommendation Comments on Treatment Session  continuing to progress well but slowly. husband and pt with no further questions/concerns about mobility. possible d/c home tomorrow.     Follow Up Recommendations  Home health PT (husband/pt refusing home health)    Equipment Recommendations  None recommended by PT (husband states he has access to RW if needed)    Frequency     Plan Discharge plan remains appropriate    Precautions / Restrictions Precautions Precautions: Fall   Pertinent Vitals/Pain     Mobility  Bed Mobility Bed Mobility: Supine to Sit Supine to Sit: 6: Modified independent (Device/Increase time);HOB elevated;With rails Transfers Transfers: Sit to Stand Sit to Stand: 6: Modified independent (Device/Increase time) Ambulation/Gait Ambulation/Gait Assistance: 4: Min guard Ambulation Distance (Feet): 500 Feet Assistive device:  (pushing iv pole) Ambulation/Gait Assistance Details: Slow gait speed. Has progressed to ambulating while pushing IV pole. Gait Pattern: Step-through pattern    Exercises     PT Goals Acute Rehab PT Goals PT Goal: Supine/Side to Sit - Progress: Progressing toward goal PT Goal: Sit to Stand - Progress: Met PT Goal: Ambulate - Progress: Progressing toward goal  Visit Information  Last PT Received On: 10/23/11 Assistance Needed: +1    Subjective Data  Subjective: "Pain is not too bad." Patient Stated Goal: Home   Cognition  Overall Cognitive Status: Appears within functional limits for tasks assessed/performed Arousal/Alertness: Awake/alert Orientation Level: Appears intact for tasks assessed Behavior During Session: [facility] for tasks performed    Balance     End of Session PT - End of Session Activity Tolerance: Patient tolerated  treatment well Patient left: with family/visitor present (pt in bathroom; husband present and stated he would assist pt back to bed)    Rebeca Alert [facility] 10/23/2011, 4:33 PM [phone]

## 2011-10-23 NOTE — Discharge Summary (Signed)
Physician Discharge Summary  Patient ID: Stacy Dalton MRN: [id number] DOB/AGE: August 26, 1950 61 y.o.  Admit date: 10/04/2011 Discharge date: 10/24/2011  Admission Diagnoses: 1. Perforated viscus of uncertain etiology.  2. Sepsis  3. COPD, history dyspnea on exertion, history of heavy tobacco use in the past.  4. Hypertension  5. History of migraines.  6. History of chronic back pain  7. Hypokalemia 8. 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 Derrell Lolling  Post op fever/tachycardia/hypotension  Post op nausea, pain and fever ? SIRS systemic inflammatory response syndrome?  CT scan 10/09/11: ascites, mesenteric edema, pleural effusions, no abscess noted.  Migraine  Hypertension  CHRONIC BACK PAIN she uses hydrocodone almost daily  HISTORY OF TOBACCO USE/NONE FOR 13 YEARS   Principal Problem:  *Perforated bowel Active Problems:  Peritonitis  Fever postop  SIRS (systemic inflammatory response syndrome)  COPD (chronic obstructive pulmonary disease)  Hypertension  Tobacco use  Chronic back pain  Migraine  Normocytic anemia   CONSULTS: Dr. Johny Sax, and Cliffton Asters  ID, Dr. Lonia Farber, and DR. 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 Derrell Lolling. CT's 4/8 & 4/22.     Hospital Course: This is a 61 year old female who was in her normal state of health until this point around 2 AM. She or her husband both report acute onset of abdominal pain. He was extremely severe she was brought to the emergency room where initial evaluation there was concern her respiratory status because of her hypoxia. Her complaint was abdominal pain. Chest x-ray on admission showed of large amount of free  air in the abdomen. CT of the abdomen shows a large amount of free intraperitoneal air consistent with perforated bowel. It was done without contrast there was no abnormality the stomach is some fecal material which may not be in the colon. We were called to the ER and saw the patient emergently. On arrival bladder temperature was 103.3. She was extremely tachycardic heart rates in the 110-120 range. O2 saturations were dropping down into the 80s on a nasal cannula she was placed on a facemask. She's been treated with analgesics and Phenergan for pain and nausea. Zosyn was been started by the emergency room physician. She was hypokalemic potassium was started in the ER. She was evaluated by Dr. Claud Kelp and it was his opinion she needed to go to the operating room emergently for exploratory laparotomy and repair of perforated viscus. Pt was take to the OR from the ER.  She had surgery as listed above, and placed in ICU.  She was hypotensive post op CCM helped with her care. She was maintained on 3 IV antibiotics.  She had some baseline COPD,  And required O2 for hypoxia.  This has slowly improved.  She continued to maintain high WBC and fevers.  She was seen by ID, with additional Dx SIRS, and HCAP made.  Antibiotics were adjusted.  She had some soupy fluid at base of open abdominal wound which was cleaned and packed wet to dry.  This is also improving.  CT 10/23/11 scan shows a small fluid collection and abscess could not be ruled out.  Dr. Ninetta Lights discontinued antibiotics.  Dr. Corliss Skains reviewed CT and feels that she is ready for discharge. Given the level of contamination in her abdomen, she is at  high risk for pelvic abscess, but she has received almost 3 weeks of IV antibiotics.  She will have home health and her husband is a former Doctor, hospital.  They can monitor her at home. We will continue wet to dry dressing and have her follow up with DR. Derrell Lolling in about 10 days.  Condition on D/C:  Improving  Disposition: Final discharge disposition not confirmed   Medication List  As of 10/24/2011 12:24 PM   TAKE these medications         acetaminophen 325 MG tablet   Commonly known as: TYLENOL   Take 2 tablets (650 mg total) by mouth every 4 (four) hours as needed for fever.      famotidine 20 MG tablet   Commonly known as: PEPCID   Take 1 tablet (20 mg total) by mouth 2 (two) times daily.      HYDROcodone-acetaminophen 5-325 MG per tablet   Commonly known as: NORCO   Take 2 tablets by mouth every 6 (six) hours as needed.      mulitivitamin with minerals Tabs   Take 1 tablet by mouth daily.      promethazine 25 MG tablet   Commonly known as: PHENERGAN   1/2 tablet to 1 tablet every 8 hours as needed for nausea with pain medicine.      traMADol 50 MG tablet   Commonly known as: ULTRAM   Take 2 tablets (100 mg total) by mouth every 12 (twelve) hours as needed.           Follow-up Information    Follow up with Ernestene Mention, MD. Schedule an appointment as soon as possible for a visit in 10 days.   Contact information:   [employer] [address], [address] [place] [address] [phone]       Follow up with Redmond Baseman, MD. Schedule an appointment as soon as possible for a visit in 2 weeks.   Contact information:   [address] [place] [address] [phone]          Signed: Sherrie George 10/24/2011, 12:24 PM

## 2011-10-23 NOTE — Progress Notes (Signed)
CT scan pending.  Wilmon Arms. Corliss Skains, MD, [facility]  10/23/2011 10:10 AM

## 2011-10-23 NOTE — Progress Notes (Signed)
19 Days Post-Op  Subjective: Today is last day of scheduled antibiotics,  Tolerating PO's fairly well, better with colostomy,  Objective: Vital signs in last 24 hours: Temp:  [98.6 F (37 C)-100.2 F (37.9 C)] 98.6 F (37 C) (04/22 0511) Pulse Rate:  [87-99] 99  (04/22 0511) Resp:  [18] 18  (04/22 0511) BP: (117-136)/(64-76) 136/76 mmHg (04/22 0511) SpO2:  [94 %-95 %] 95 % (04/22 0511) Last BM Date: 10/22/11 Tm 100.2, VSS, Labs show WBC is still up slightly, CXR ok last week,  Intake/Output from previous day: 04/21 0701 - 04/22 0700 In: 680 [P.O.:480; IV Piggyback:200] Out: 2625 [Urine:2300; Stool:325] Intake/Output this shift:    General appearance: alert, cooperative and no distress Resp: clear to auscultation bilaterally GI: soft, tolerating diet well.  no distension, colostomy working, her incision looks much better.  .  Lab Results:   Basename 10/23/11 0815  WBC 11.8*  HGB 10.0*  HCT 30.8*  PLT 517*    BMET  Basename 10/23/11 0815  NA 138  K 3.9  CL 103  CO2 27  GLUCOSE 103*  BUN 7  CREATININE 0.49*  CALCIUM 8.7   PT/INR No results found for this basename: LABPROT:2,INR:2 in the last 72 hours   Lab 10/23/11 0815 10/19/11 0625  AST 16 14  ALT 11 18  ALKPHOS 74 77  BILITOT 0.2* 0.2*  PROT 6.5 6.2  ALBUMIN 2.2* 2.0*     Lipase     Component Value Date/Time   LIPASE 20 10/04/2011 1345     Studies/Results: No results found.  Medications:    . antiseptic oral rinse  15 mL Mouth Rinse BID  . ceFEPime (MAXIPIME) IV  2 g Intravenous Q12H  . heparin  5,000 Units Subcutaneous Q8H  . insulin aspart  0-9 Units Subcutaneous TID WC  . metronidazole  500 mg Intravenous Q8H  . mulitivitamin with minerals  1 tablet Oral Daily  . pantoprazole  40 mg Oral BID AC  . sodium chloride  10-40 mL Intracatheter Q12H  . 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 Derrell Lolling  Post op fever/tachycardia/hypotension  Post op nausea, pain and fever ? SIRS systemic inflammatory response syndrome?  CT scan 10/09/11: ascites, mesenteric edema, pleural effusions, no abscess noted.  Migraine  Hypertension  CHRONIC BACK PAIN she uses hydrocodone almost daily  HISTORY OF TOBACCO USE/NONE FOR 13 YEARS    Plan:  Still spiking low grade fever, WBC up slightly.  Dr. Derrell Lolling reviewed her chart this AM and would recommend a CT prior to D/C because of her significant contamination.  I will order for today, check results and get recommendations from Dr. Ninetta Lights.  Hope to send her home tomorrow.      LOS: 19 days    Stacy Dalton 10/23/2011

## 2011-10-24 LAB — GLUCOSE, CAPILLARY
Glucose-Capillary: 129 mg/dL — ABNORMAL HIGH (ref 70–99)
Glucose-Capillary: 93 mg/dL (ref 70–99)

## 2011-10-24 MED ORDER — FAMOTIDINE 20 MG PO TABS: 20.0000 mg | ORAL_TABLET | Freq: Two times a day (BID) | ORAL | Status: DC

## 2011-10-24 MED ORDER — HYDROCODONE-ACETAMINOPHEN 5-325 MG PO TABS: 2.0000 | ORAL_TABLET | Freq: Four times a day (QID) | ORAL | Status: AC | PRN

## 2011-10-24 MED ORDER — PROMETHAZINE HCL 25 MG PO TABS: ORAL_TABLET | ORAL | Status: DC

## 2011-10-24 MED ORDER — TRAMADOL HCL 50 MG PO TABS: 100.0000 mg | ORAL_TABLET | Freq: Two times a day (BID) | ORAL | Status: AC | PRN

## 2011-10-24 MED ORDER — ACETAMINOPHEN 325 MG PO TABS: 650.0000 mg | ORAL_TABLET | ORAL | Status: AC | PRN

## 2011-10-24 NOTE — Discharge Instructions (Signed)
[facility]      [facility] [phone]  OPEN ABDOMINAL SURGERY: POST OP INSTRUCTIONS  Always review your discharge instruction sheet given to you by the facility where your surgery was performed.  IF YOU HAVE DISABILITY OR FAMILY LEAVE FORMS, YOU MUST BRING THEM TO THE OFFICE FOR PROCESSING.  PLEASE DO NOT GIVE THEM TO YOUR DOCTOR.  1. A prescription for pain medication may be given to you upon discharge.  Take your pain medication as prescribed, if needed.  If narcotic pain medicine is not needed, then you may take acetaminophen (Tylenol) or ibuprofen (Advil) as needed. 2. Take your usually prescribed medications unless otherwise directed. 3. If you need a refill on your pain medication, please contact your pharmacy. They will contact our office to request authorization.  Prescriptions will not be filled after 5pm or on week-ends. 4. You should follow a light diet the first few days after arrival home, such as soup and crackers, pudding, etc.unless your doctor has advised otherwise. A high-fiber, low fat diet can be resumed as tolerated.   Be sure to include lots of fluids daily. Most patients will experience some swelling and bruising on the chest and neck area.  Ice packs will help.  Swelling and bruising can take several days to resolve 5. Most patients will experience some swelling and bruising in the area of the incision. Ice pack will help. Swelling and bruising can take several days to resolve..  6. It is common to experience some constipation if taking pain medication after surgery.  Increasing fluid intake and taking a stool softener will usually help or prevent this problem from occurring.  A mild laxative (Milk of Magnesia or Miralax) should be taken according to package directions if there are no bowel movements after 48 hours. 7.  You may have steri-strips (small skin tapes) in place directly over the incision.  These strips should be left on the skin for 7-10 days.  If your  surgeon used skin glue on the incision, you may shower in 24 hours.  The glue will flake off over the next 2-3 weeks.  Any sutures or staples will be removed at the office during your follow-up visit. You may find that a light gauze bandage over your incision may keep your staples from being rubbed or pulled. You may shower and replace the bandage daily. 8. ACTIVITIES:  You may resume regular (light) daily activities beginning the next day--such as daily self-care, walking, climbing stairs--gradually increasing activities as tolerated.  You may have sexual intercourse when it is comfortable.  Refrain from any heavy lifting or straining until approved by your doctor. a. You may drive when you no longer are taking prescription pain medication, you can comfortably wear a seatbelt, and you can safely maneuver your car and apply brakes b. Return to Work: ___________________________________ 9. You should see your doctor in the office for a follow-up appointment approximately two weeks after your surgery.  Make sure that you call for this appointment within a day or two after you arrive home to insure a convenient appointment time. OTHER INSTRUCTIONS:  _____________________________________________________________ _____________________________________________________________  WHEN TO CALL YOUR DOCTOR: 1. Fever over 101.0 2. Inability to urinate 3. Nausea and/or vomiting 4. Extreme swelling or bruising 5. Continued bleeding from incision. 6. Increased pain, redness, or drainage from the incision. 7. Difficulty swallowing or breathing 8. Muscle cramping or spasms. 9. Numbness or tingling in hands or feet or around lips.  The clinic staff is available to   answer your questions during regular business hours.  Please don't hesitate to call and ask to speak to one of the nurses if you have concerns.  For further questions, please visit [email or web address]  Peritonitis Peritonitis is inflammation  (the body's way of reacting to injury and/or infection) of the peritoneum. The peritoneum is the tissue that lines the abdomen and covers the internal organs. CAUSES   Conditions or injuries cause organs to leak stool, bacteria, fungi or chemicals (such as bile or other digestive juices) into the abdomen. When these substances come into contact with the peritoneum, they may cause irritation or infection.   Many conditions can cause peritonitis, including:   Pancreatitis.   Diverticulitis.   Appendicitis.   Ulcers.   Crohn's disease or ulcerative colitis.   Other infections inside the abdomen or pelvis.   Abdominal injury.   Injury to the stomach or esophagus (food pipe).   Cancer.   Liver disease.   Peritoneal dialysis (a procedure used to cleanse the blood when the kidneys are unable to do so).   Tuberculosis.  SYMPTOMS  Symptoms of peritonitis may include:  Severe pain.   Abdominal swelling.   Fever and chills.   Nausea and vomiting.   Poor or no appetite.   Inability to pass gas or stool.  DIAGNOSIS  Diagnosis begins with a complete physical exam. The abdomen may be tender or hard and board-like. Testing may include:  Blood tests.   Urinalysis   Stool analysis (if there is associated diarrhea).   Paracentesis: a procedure where a very thin needle may be used to take a sample of fluid from within the abdomen. This fluid can be examined for signs of infection.   X-ray.   Ultrasound.   CT scans.  TREATMENT  This must be treated quickly, to avoid a severe, life-threatening infection that involves the whole body (sepsis). This must be done in the hospital. Treatment for peritonitis may include:  Antibiotics.   Surgery to remove infected fluid and tissue.   Surgery to treat conditions that cause peritonitis (such as an appendectomy for appendicitis).  HOME CARE INSTRUCTIONS  Even after successful treatment in the hospital, treatment and recovery will  continue at home. It is important to:  Take medicines (such as antibiotics) exactly as prescribed by your caregiver.   If you were taking prescription medicines for other problems before you developed peritonitis, ask about when you should re-start these medicines.   Only take over-the-counter or prescription medicines for pain, discomfort or fever as directed by your caregiver.   Follow your prescribed diet. You might need a higher fiber diet to help avoid constipation.   Drink extra water.   Use a stool softener or laxative, if recommended.   Get extra rest.  SEEK IMMEDIATE MEDICAL CARE IF:  You have increased abdominal pain or tenderness, or abdominal swelling.   You develop an unexplained oral temperature above 102 F (38.9 C).   You start to have the chills.   You have new problems with passing urine.   You develop chest pains and/or shortness of breath.   You experience nausea, vomiting or diarrhea.   You are unable to pass gas or stool.   You have had surgery and notice the healing incision has become hot, red, swollen or is leaking pus or blood.  Document Released: 06/01/2008 Document Revised: 06/08/2011 Document Reviewed: 06/01/2008 [facility] Patient Information 2012 [place], [place].Open Colon Resection Colon resection is surgery to take out part or all of  the large intestine (colon). It is also called a colectomy.  LET YOUR CAREGIVER KNOW ABOUT:  Any allergies.   All medicines you are taking, including:   Herbs, eyedrops, over-the-counter medicines and creams.   Blood thinners (anticoagulants), aspirin, or other drugs that could affect blood clotting.   Use of steroids (by mouth or as creams).   Previous problems with anesthetics, including local anesthetics.   Possibility of pregnancy, if this applies.   Any history of blood clots.   Any history of bleeding or other blood problems.   Previous surgery.   Smoking history.   Any recent symptoms of  colds or infections.   Other health problems.  RISKS AND COMPLICATIONS  There are always risks for surgery with medicine that makes you sleep (general anesthetic). They include breathing and heart problems. However, this risk is low for people who have no other health problems. Other complications from colon resection may include:  An infection developing in the area where the surgery was done.   Problems with the incisions including:   Bleeding from an incision.   The wound reopening.   Tissues from inside the abdomen bulging through the incision (hernia).   Bleeding inside the abdomen.   Reopening of the colon where it was stitched or stapled together. This is a serious complication. Another procedure may be needed to fix the problem.   Damage to other organs in the abdomen.   A blood clot forming in a vein and traveling to the lungs.   Future blockage of the colon.  BEFORE THE PROCEDURE  A medical evaluation will be done. This may include:   A physical exam.   Blood tests.   A test to check the heart's rhythm (electrocardiogram).   X-rays, such as magnetic resonance imaging (MRI). This can take pictures of the colon. An MRI uses a magnet, radio waves, and a computer to create a picture of your colon.   Talking with the person who will be in charge of the medicine during the procedure. An open colon resection requires general anesthesia. Ask what you can expect.   Two weeks before the surgery, stop using aspirin and nonsteroidal anti-inflammatory drugs (NSAIDs) for pain relief. This includes prescription drugs and over-the-counter drugs. Also stop taking vitamin E.   If you take blood thinners, ask your caregiver when you should stop taking them.   Do not eat or drink anything for 8 to 12 hours before the surgery. Ask your caregiver if it is okay to take any needed medicines with a sip of water.   Ask your caregiver if you need to arrive early before the procedure.    On the day of your surgery, your caregiver will need to know the last time you had anything to eat or drink. This includes water, gum, and candy.   Make arrangements in advance for someone to drive you home.  PROCEDURE Colon resection can take 1 to 4 hours.  Small monitors will be put on your body. They are used to check your heart, blood pressure, and oxygen level.   You will be given an intravenous line (IV). A needle will be inserted in your arm. Medicine will be able to flow directly into your body through this needle.   You might be given a medicine to help you relax (sedative).   You will be given a general anesthetic.   A tube may be put in through your nose. It is called a nasogastric tube. It is  used to remove stomach juices after surgery until the intestines start working again.   Once you are asleep, the surgeon will make an incision in the abdomen about 6 to 12 inches long.   Clamps are put on both ends of the diseased part of the colon.   The part of the intestine between the clamps is removed.   If possible, the ends of the healthy colon that remain will be stitched or stapled together.   Sometimes, a colostomy is needed. For a colostomy:   An opening (stoma) to the outside is made through the abdomen.   The end of the colon is brought through the opening. It is stitched to the skin.   A bag is attached to the opening. Waste will drain into this bag. The bag is removable.   The colostomy can be temporary or permanent. Ask your surgeon what to expect.   The incision from the colon resection will be closed with stitches or staples.  AFTER THE PROCEDURE  You will stay in a recovery area until the anesthesia has worn off. Your blood pressure and pulse will be checked every so often. Then you will be taken to a hospital room.   You will continue to get fluids through the IV for awhile. The IV will be taken out when the colon starts working again.   You will  gradually go back to a normal diet.   Some pain is normal after a colon resection. Ask for pain medicine if the pain becomes too much.   You will be urged to get up and start walking after 1 or 2 days, at the most.   If you had a colostomy, your caregiver will explain how it works and what you will need to do.   Most people spend 3 to 7 days in the hospital after this surgery. Ask your caregiver what to expect.  Document Released: 04/16/2009 Document Revised: 06/08/2011 Document Reviewed: 11/04/2010 [facility] Patient Information 2012 [place], [place].

## 2011-10-24 NOTE — Progress Notes (Signed)
20 Days Post-Op  Subjective: I'm ready to go home.  Objective: Vital signs in last 24 hours: Temp:  [98.4 F (36.9 C)-99.3 F (37.4 C)] 98.5 F (36.9 C) (04/23 4098) Pulse Rate:  [86-96] 86  (04/23 0608) Resp:  [18-20] 18  (04/23 0608) BP: (117-131)/(63-80) 131/80 mmHg (04/23 0608) SpO2:  [94 %-99 %] 99 % (04/23 0608) Last BM Date: 10/24/11 TM 99, VSS, labs OK yesterday, There is one residual fluid collection 2 x 3.7 cm Left gutter. Intake/Output from previous day: 04/22 0701 - 04/23 0700 In: 11914.7 [P.O.:960; IV Piggyback:19028.8] Out: 2000 [Urine:2000] Intake/Output this shift:    General appearance: alert, cooperative and no distress GI: soft, abdominal wound looks good.  colostomy working well.  Lab Results:   [facility] 10/23/11 0815  WBC 11.8*  HGB 10.0*  HCT 30.8*  PLT 517*    BMET  Basename 10/23/11 0815  NA 138  K 3.9  CL 103  CO2 27  GLUCOSE 103*  BUN 7  CREATININE 0.49*  CALCIUM 8.7   PT/INR No results found for this basename: LABPROT:2,INR:2 in the last 72 hours   Lab 10/23/11 0815 10/19/11 0625  AST 16 14  ALT 11 18  ALKPHOS 74 77  BILITOT 0.2* 0.2*  PROT 6.5 6.2  ALBUMIN 2.2* 2.0*     Lipase     Component Value Date/Time   LIPASE 20 10/04/2011 1345     Studies/Results: Ct Abdomen Pelvis Wo Contrast  10/23/2011  *RADIOLOGY REPORT*  Clinical Data: Postop from partial colectomy and colostomy due to colon perforation and peritonitis.  Continued abdominal pain and low grade fever.  CT ABDOMEN AND PELVIS WITHOUT CONTRAST  Technique:  Multidetector CT imaging of the abdomen and pelvis was performed following the standard protocol without intravenous contrast.  Comparison: 10/13/2011  Findings: Decreased bibasilar atelectasis is seen since previous study with resolution of small bilateral pleural effusions.  There is also decreased ascites since previous study.  Open midline ventral abdominal wall surgical incision is noted as well as left lower  quadrant colostomy.  A small residual fluid collection is seen in the left pericolic gutter measuring 2.0 x 3.7 cm.  This may represent residual postop fluid although a small abscess cannot definitely be excluded.  No other residual fluid collections are seen.  No evidence of dilated bowel loops. There has been resolution of small bowel and colonic wall thickening since previous study.  Persistent mesenteric soft tissue stranding noted.  The abdominal parenchymal organs have a normal appearance on this noncontrast study.  No evidence of hydronephrosis.  No soft tissue masses or lymphadenopathy identified.  IMPRESSION:  1.  Decreased ascites, small bilateral pleural effusions, and bibasilar atelectasis. 2.  Persistent 2 x 4 cm fluid collection within the left pericolic gutter, which may represent residual postop fluid although a small abscess cannot be excluded.  Original Report Authenticated By: Danae Orleans, M.D.    Medications:    . antiseptic oral rinse  15 mL Mouth Rinse BID  . heparin  5,000 Units Subcutaneous Q8H  . insulin aspart  0-9 Units Subcutaneous TID WC  . mulitivitamin with minerals  1 tablet Oral Daily  . pantoprazole  40 mg Oral BID AC  . sodium chloride  10-40 mL Intracatheter Q12H  . DISCONTD: ceFEPime (MAXIPIME) IV  2 g Intravenous Q12H  . DISCONTD: metronidazole  500 mg Intravenous Q8H    Assessment/Plan Stercoral perforation left colon with diffuse peritonitis  Stercoral perforation left colon with diffuse peritonitis,  s/p Exploratory laparotomy, left colectomy, closure distal segment, and descending colostomy 10/04/11 Dr. Derrell Lolling  Post op fever/tachycardia/hypotension  Post op nausea, pain and fever ? SIRS systemic inflammatory response syndrome?  CT scan 10/09/11: ascites, mesenteric edema, pleural effusions, no abscess noted.  Migraine  Hypertension  CHRONIC BACK PAIN she uses hydrocodone almost daily  HISTORY OF TOBACCO USE/NONE FOR 13 YEARS    Will let DR. Tsuei see  films from yesterday.  Hope to send home today.  Home Health has been arranged.   LOS: 20 days    Stacy Dalton 10/24/2011

## 2011-10-24 NOTE — Progress Notes (Signed)
Patient and husband have finally agreed to [facility] visits for wound and ostomy care. Contacted [facility] to arrange, they are unable to see patient due to insurance issues. Contacted [facility] with patients permission, they are able to provide services, orders and demographics provided.  Plan for d/c today once plans are final.

## 2011-10-24 NOTE — Progress Notes (Signed)
OT Note Attempted tx session this am. Pt states that she is now bathing, dressing and toileting herself independently & states she no longer needs OT services. Pt also declining HH therapy. Pt also stated that her husband is a nurse & they are both able to empty her colostomy bag. Will sign off at this time. Please re-order if pt has a decline in medical status.  Garrel Ridgel, OTR/L  Pager [phone] 10/24/2011

## 2011-10-24 NOTE — Progress Notes (Signed)
CT no sign of abscess Patient looks well clinically Ready for discharge.  Wilmon Arms. Corliss Skains, MD, [facility]  10/24/2011 1:55 PM

## 2011-10-27 ENCOUNTER — Telehealth (INDEPENDENT_AMBULATORY_CARE_PROVIDER_SITE_OTHER): Payer: Self-pay | Admitting: General Surgery

## 2011-10-27 NOTE — Telephone Encounter (Signed)
Edwina from [facility] called re: patient's wound. Wound looks good. She is having wet to dry dressing changes twice a day to wound. [facility] nurse is suggesting wound vac. Please advise if this is something you would consider ordering. Let nurse know at [phone].

## 2011-11-09 ENCOUNTER — Other Ambulatory Visit (INDEPENDENT_AMBULATORY_CARE_PROVIDER_SITE_OTHER): Payer: Self-pay

## 2011-11-09 ENCOUNTER — Telehealth (INDEPENDENT_AMBULATORY_CARE_PROVIDER_SITE_OTHER): Payer: Self-pay | Admitting: General Surgery

## 2011-11-09 ENCOUNTER — Encounter (INDEPENDENT_AMBULATORY_CARE_PROVIDER_SITE_OTHER): Payer: Self-pay | Admitting: General Surgery

## 2011-11-09 ENCOUNTER — Ambulatory Visit (INDEPENDENT_AMBULATORY_CARE_PROVIDER_SITE_OTHER): Admitting: General Surgery

## 2011-11-09 ENCOUNTER — Other Ambulatory Visit (INDEPENDENT_AMBULATORY_CARE_PROVIDER_SITE_OTHER): Payer: Self-pay | Admitting: General Surgery

## 2011-11-09 VITALS — BP 100/68 | HR 120 | Temp 100.9°F | Resp 16 | Ht 62.0 in | Wt 128.2 lb

## 2011-11-09 DIAGNOSIS — R509 Fever, unspecified: Secondary | ICD-10-CM

## 2011-11-09 DIAGNOSIS — R5082 Postprocedural fever: Secondary | ICD-10-CM

## 2011-11-09 LAB — CBC WITH DIFFERENTIAL/PLATELET
Basophils Absolute: 0 10*3/uL (ref 0.0–0.1)
Basophils Relative: 0 % (ref 0–1)
Eosinophils Absolute: 0.8 10*3/uL — ABNORMAL HIGH (ref 0.0–0.7)
Eosinophils Relative: 8 % — ABNORMAL HIGH (ref 0–5)
HCT: 36.5 % (ref 36.0–46.0)
Hemoglobin: 11.7 g/dL — ABNORMAL LOW (ref 12.0–15.0)
Lymphocytes Relative: 16 % (ref 12–46)
Lymphs Abs: 1.6 10*3/uL (ref 0.7–4.0)
MCH: 28.7 pg (ref 26.0–34.0)
MCHC: 32.1 g/dL (ref 30.0–36.0)
MCV: 89.5 fL (ref 78.0–100.0)
Monocytes Absolute: 1.1 10*3/uL — ABNORMAL HIGH (ref 0.1–1.0)
Monocytes Relative: 11 % (ref 3–12)
Neutro Abs: 6.6 10*3/uL (ref 1.7–7.7)
Neutrophils Relative %: 66 % (ref 43–77)
Platelets: 275 10*3/uL (ref 150–400)
RBC: 4.08 MIL/uL (ref 3.87–5.11)
RDW: 14.6 % (ref 11.5–15.5)
WBC: 10.1 10*3/uL (ref 4.0–10.5)

## 2011-11-09 LAB — BASIC METABOLIC PANEL
BUN: 12 mg/dL (ref 6–23)
CO2: 28 mEq/L (ref 19–32)
Calcium: 9.1 mg/dL (ref 8.4–10.5)
Chloride: 100 mEq/L (ref 96–112)
Creat: 0.57 mg/dL (ref 0.50–1.10)
Glucose, Bld: 100 mg/dL — ABNORMAL HIGH (ref 70–99)
Potassium: 4.3 mEq/L (ref 3.5–5.3)
Sodium: 136 mEq/L (ref 135–145)

## 2011-11-09 NOTE — Progress Notes (Signed)
HPI The patient comes in with a complaint of a fever. He was up to 102.5 last night and is currently at 100.9. She is not in severe distress however she does seem to be very uncomfortable. Her last CT scan of the abdomen and pelvis was done when she was hospitalized near 23 October 2011.  PE On examination I removed the patient's VAC dressing the wound appears to be granulating well with no evidence of pus build up underneath the dressing. She is tender in her abdomen particularly in the right lower quadrant but she has normal bowel sounds. Her lungs are clear to auscultation with no demonstration of rhonchi or rales.  Studiy review Currently there are no studies to review.  Assessment Fever of unknown origin in a patient who had a previous perforated bowel. She has a colostomy which is functioning well to her wound appears to be good. However with the fever I suspect that she may have an intra-abdominal abscess brewing. She did get a CBC done today which showed a normal white blood cell count.  Plan CT scan of the abdomen and pelvis with contrast be done tomorrow morning and was in the hospital. Contrast would be important however intravenous contrast may not be necessary  She should followup with Dr. Derrell Lolling in the near future. Based on the findings of the CT scan the patient may require oral antibiotics and more percutaneous drainage.

## 2011-11-09 NOTE — Telephone Encounter (Signed)
Husband calling to report pt has temperature 10 F2 last night.  Spoke with Dr. Daphine Deutscher overnight; said she needs to be seen today.  Dr. Derrell Lolling updated in office and ordered CBC and BMET, then be seen in the Urgent clinic today.  Husband instructed to GO NOW for labs to be drawn to have results available for appt at 3:00 (work-in.)  Husband understands and will take her immediately to [facility].

## 2011-11-10 ENCOUNTER — Other Ambulatory Visit (INDEPENDENT_AMBULATORY_CARE_PROVIDER_SITE_OTHER): Payer: Self-pay | Admitting: General Surgery

## 2011-11-10 ENCOUNTER — Ambulatory Visit (HOSPITAL_COMMUNITY)
Admission: RE | Admit: 2011-11-10 | Discharge: 2011-11-10 | Disposition: A | Discharge status: Home / Self Care | Source: Ambulatory Visit | Attending: General Surgery | Admitting: General Surgery

## 2011-11-10 DIAGNOSIS — Z905 Acquired absence of kidney: Secondary | ICD-10-CM | POA: Insufficient documentation

## 2011-11-10 DIAGNOSIS — Z91041 Radiographic dye allergy status: Secondary | ICD-10-CM | POA: Insufficient documentation

## 2011-11-10 DIAGNOSIS — R509 Fever, unspecified: Secondary | ICD-10-CM | POA: Insufficient documentation

## 2011-11-10 DIAGNOSIS — R109 Unspecified abdominal pain: Secondary | ICD-10-CM | POA: Insufficient documentation

## 2011-11-13 ENCOUNTER — Telehealth (INDEPENDENT_AMBULATORY_CARE_PROVIDER_SITE_OTHER): Payer: Self-pay | Admitting: General Surgery

## 2011-11-13 ENCOUNTER — Telehealth (INDEPENDENT_AMBULATORY_CARE_PROVIDER_SITE_OTHER): Payer: Self-pay

## 2011-11-13 NOTE — Telephone Encounter (Signed)
Pt wants to change the pharmacy we call for her Vicodin to [facility]:  [phone].  Called in Hydrocodone 5/325 mg, # 30, 1 po q 4-6 h prn pain, NO refill.  Called [facility] to cancel that Rx as just called in.

## 2011-11-13 NOTE — Telephone Encounter (Signed)
Dr Derrell Lolling spoke with [facility] via phone. Dr Derrell Lolling gave order for refill vicodin 5/325 #30 one po q4-6h prn pain. RX called to [facility] [phone] [phone].

## 2011-11-23 ENCOUNTER — Telehealth (INDEPENDENT_AMBULATORY_CARE_PROVIDER_SITE_OTHER): Payer: Self-pay

## 2011-11-23 ENCOUNTER — Ambulatory Visit (INDEPENDENT_AMBULATORY_CARE_PROVIDER_SITE_OTHER): Admitting: General Surgery

## 2011-11-23 ENCOUNTER — Encounter (INDEPENDENT_AMBULATORY_CARE_PROVIDER_SITE_OTHER): Payer: Self-pay | Admitting: General Surgery

## 2011-11-23 VITALS — BP 148/84 | HR 84 | Temp 97.9°F | Resp 14 | Ht 62.0 in | Wt 128.8 lb

## 2011-11-23 DIAGNOSIS — K631 Perforation of intestine (nontraumatic): Secondary | ICD-10-CM

## 2011-11-23 NOTE — Telephone Encounter (Signed)
Barbara at [facility] given order to DC wound vac and begin saline moist gauze packing, cover dry drsg bid. Can teach family member to do wd care.

## 2011-11-23 NOTE — Patient Instructions (Signed)
You are recovering from your emergency colon surgery with colostomy. Stay well hydrated and follow a high-fiber, low-fat diet. Take a multivitamin every day.  Increase the amount of exercise with walking  every day. No sports or lifting more than 15 pounds for 6 weeks from the date of surgery.  Discontinue the VAC dressing and change the bandage with saline and fine mesh gauze wet-to-dry twice daily.  You may shower.  Dr. Modesto Charon will treat your urinary tract infection. I suggest that you get another urine culture in 2 or 3 weeks to make sure that the enterococcus has been eradicated.  Return to see Dr. Derrell Lolling in 8 weeks.  Eventually we will schedule you for colonoscopy.  Remember to take tap water enemas per rectum every few days for the next 2 weeks to be sure that the rectal colon has been evacuated.  We will tentatively plan to closure colostomy in September.

## 2011-11-23 NOTE — Progress Notes (Signed)
Subjective:     Patient ID: Stacy Dalton, female   DOB: [birth year], 61 y.o.   MRN: [id number]  HPI This patient returns for a postop followup visit.  She presented on April 3 with a stercoral perforation of the left colon and diffuse peritonitis. She underwent emergent left colectomy with colostomy. She was very septic and required critical care management for several days. She was discharged home on April 23. Her wound was left open. She has had a VAC on the wound for some time but the wound is now granulating in.  She is getting stronger. She is able to go to [employer] and shop.  She has a urinary tract infection with enterococcus, and is taking antibiotics under the supervision of Dr. Leodis Sias. She has no voiding symptoms. Her fevers have resolved.  She was last seen in our office on May 9. CT scan was performed at that time because of fever but it looked good and there was no abscess. White count and white count differential were normal.  Basically she states she is improving. Activity is increasing. Pain is resolving. Stools are soft and putting like coming out of the colostomy and she appears to back twice a day. She is best little bit of stool per rectum.  Review of Systems     Objective:   Physical Exam Patient looks pretty good, considering what happened to her.Temp  97.9. Heart rate 84. In no distress. Skin warm and dry.  Lungs clear to auscultation bilaterally  And soft. Minimally tender. Midline incision is granulating and without any signs of necrosis or purulence. The lower half of the wound shows some separation of the fascia and I suspect she will develop a hernia there. The colostomy is healthy. She has minimal tenderness.    Assessment:     Stercoral perforation of left colon with diffuse peritonitis, status post emergent left colectomy with colostomy. Recovering slowly but without major complication. Anticipate full recovery.  Postop  SIRS  UTI-enterococcus.  Managed by Dr. Modesto Charon (PCP)  Chronic obstructive pulmonary disease.  Hypertension     Plan:     Discontinue VAC dressings, and change to saline fine mesh gauze wet to dry. Anticipate healing by secondary intention within a few weeks.  Management of UTI by Dr. Modesto Charon.  Return to see me in 8 weeks  Eventually she will need a colonoscopy and possibly barium enema. She has never had a colonoscopy.  She will give herself a few enemas per rectum to be sure  that there is no residual stool.  Tentatively plan colostomy closure perhaps in September. This is assuming that her health status returns to baseline.   Angelia Mould. Derrell Lolling, M.D., [facility] General and Minimally invasive Surgery Breast and Colorectal Surgery Office:   [phone] Pager:   [phone]

## 2011-12-29 ENCOUNTER — Encounter (INDEPENDENT_AMBULATORY_CARE_PROVIDER_SITE_OTHER): Payer: Self-pay

## 2012-01-22 ENCOUNTER — Ambulatory Visit (INDEPENDENT_AMBULATORY_CARE_PROVIDER_SITE_OTHER): Admitting: General Surgery

## 2012-01-22 ENCOUNTER — Encounter (INDEPENDENT_AMBULATORY_CARE_PROVIDER_SITE_OTHER): Payer: Self-pay | Admitting: General Surgery

## 2012-01-22 VITALS — BP 154/72 | HR 74 | Temp 96.9°F | Resp 16 | Ht 62.0 in | Wt 129.1 lb

## 2012-01-22 DIAGNOSIS — K631 Perforation of intestine (nontraumatic): Secondary | ICD-10-CM

## 2012-01-22 NOTE — Progress Notes (Unsigned)
Faxed referral request to [facility] (Dr. Dulce Sellar) at the request of Dr. Derrell Lolling for pre-op evaluation.

## 2012-01-22 NOTE — Progress Notes (Signed)
Subjective:     Patient ID: Stacy Dalton, female   DOB: 13-Aug-1950, 61 y.o.   MRN: [id number]  HPI Patient returns for a postop visit.  She presented with peritonitis and a stercoral perforation of the left colon. On 10/04/2011 she underwent laparotomy, left colectomy and colostomy. Postop course was notable for SIRS and a septic course requiring critical care medicine management. She was discharged home 3 weeks later, April 23.  She is improving. She is eating better. The colostomy is working fine. She is motivated to have colostomy closure. She still has an open wound in the lower abdomen.   her enterococcus urinary tract infection is now cleared up and she sees Dr. Leodis Sias regularly.  She has never had a colonoscopy.  Family history is notable for a sister who died of abdominal carcinomatosis of unknown primary 3 years ago. Grandmother had some type of colon malignancy.  Review of Systems     Objective:   Physical Exam Patient is alert, in good spirits, her husband is with her. Weight is 129.2, which is 1.2 pounds heavier than in May.  Abdomen is soft and nontender. Lower midline incision is mostly healed, but there is still a 3 cm area of superficial, shallow granulation tissue in the lower wound. I cauterized this with silver nitrate and redressed. The colostomy looks healthy. I suspected she is developing a bulge in her lower incision.    Assessment:     Stercoral perforation of left colon secondary to chronic constipation.  Status post emergent left colectomy with colostomy due to colon perforation with diffuse peritonitis  Postop SIRS  COPD  Hypertension  Enterococcus UTI,  reportedly resolved.    Plan:     She is motivated to have her colostomy closed, and I think that we can do that sometime this fall.  She will be referred to gastroenterology for colonoscopy per rectum and per colostomy  She is advised to drink 2 or 3 cans of Ensure daily. I would like  her to gain 2 or 3 pounds prior to colostomy closure  Return to see me in 6 weeks    Luana Tatro M. Derrell Lolling, M.D., [facility] General and Minimally invasive Surgery Breast and Colorectal Surgery Office:   [phone] Pager:   [phone]

## 2012-01-22 NOTE — Patient Instructions (Signed)
Your abdominal wound is healing. The granulation tissue was cauterized with silver nitrate today, so the dressing may look a little bit more gray. This should decrease the bleeding. Continue to change the dressing once or twice daily, as necessary.  Drink more fluid and continue to take daily fiber. Drink 2-3 cans of Ensure daily.  You'll be referred to gastroenterology for colonoscopy. They will need to look up through the colostomy and up through the rectum.  Return to see Dr. Derrell Lolling in 6 weeks, and we will discuss next steps at that time.

## 2012-02-26 ENCOUNTER — Encounter (HOSPITAL_COMMUNITY): Payer: Self-pay | Admitting: *Deleted

## 2012-02-28 ENCOUNTER — Ambulatory Visit (HOSPITAL_COMMUNITY): Admitting: Anesthesiology

## 2012-02-28 ENCOUNTER — Ambulatory Visit (HOSPITAL_COMMUNITY)
Admission: RE | Admit: 2012-02-28 | Discharge: 2012-02-28 | Disposition: A | Discharge status: Home / Self Care | Source: Ambulatory Visit | Attending: Gastroenterology | Admitting: Gastroenterology

## 2012-02-28 ENCOUNTER — Encounter (HOSPITAL_COMMUNITY)
Admission: RE | Disposition: A | Discharge status: Home / Self Care | Payer: Self-pay | Source: Ambulatory Visit | Attending: Gastroenterology

## 2012-02-28 ENCOUNTER — Encounter (HOSPITAL_COMMUNITY): Payer: Self-pay

## 2012-02-28 ENCOUNTER — Encounter (HOSPITAL_COMMUNITY): Payer: Self-pay | Admitting: Anesthesiology

## 2012-02-28 DIAGNOSIS — Z1211 Encounter for screening for malignant neoplasm of colon: Secondary | ICD-10-CM | POA: Insufficient documentation

## 2012-02-28 DIAGNOSIS — Z933 Colostomy status: Secondary | ICD-10-CM | POA: Insufficient documentation

## 2012-02-28 SURGERY — COLONOSCOPY WITH PROPOFOL
Anesthesia: Monitor Anesthesia Care

## 2012-02-28 MED ORDER — PROPOFOL INFUSION 10 MG/ML OPTIME
INTRAVENOUS | Status: DC | PRN
  Administered 2012-02-28: 180 ug/kg/min via INTRAVENOUS

## 2012-02-28 MED ORDER — SODIUM CHLORIDE 0.9 % IV SOLN: INTRAVENOUS | Status: DC

## 2012-02-28 MED ORDER — MIDAZOLAM HCL 5 MG/5ML IJ SOLN
INTRAMUSCULAR | Status: DC | PRN
  Administered 2012-02-28: 2 mg via INTRAVENOUS

## 2012-02-28 MED ORDER — LACTATED RINGERS IV SOLN
INTRAVENOUS | Status: DC
  Administered 2012-02-28: 1000 mL via INTRAVENOUS

## 2012-02-28 MED ORDER — FENTANYL CITRATE 0.05 MG/ML IJ SOLN
INTRAMUSCULAR | Status: DC | PRN
  Administered 2012-02-28: 100 ug via INTRAVENOUS

## 2012-02-28 MED ORDER — FENTANYL CITRATE 0.05 MG/ML IJ SOLN: 25.0000 ug | INTRAMUSCULAR | Status: DC | PRN

## 2012-02-28 SURGICAL SUPPLY — 22 items

## 2012-02-28 NOTE — Op Note (Signed)
[facility] [address] [place], [address]   COLONOSCOPY PROCEDURE REPORT  PATIENT: Vandy, Fong  MR#: [id number] BIRTHDATE: [birth year] , 61  yrs. old GENDER: Female ENDOSCOPIST: Willis Modena, MD REFERRED XB:[id number] Derrell Lolling, MD PROCEDURE DATE:  02/28/2012 PROCEDURE:   Colonoscopy (via colostomy and via Hartman's pouch) ASA CLASS: INDICATIONS:Screening; prior spontaneous colonic perforation MEDICATIONS: MAC anesthesia DESCRIPTION OF PROCEDURE:   After the risks benefits and alternatives of the procedure were thoroughly explained, informed consent was obtained.       The Pentax Ped Colon [id number]  endoscope was introduced through the colostomy and then the anusand advanced to the cecum and stump of the Hartman's pouch, respectively       . No adverse events experienced.   The quality of the prep was good.       The instrument was then slowly withdrawn as the colon was fully examined.   Findings Colostomy: LLQ colostomy appeared normal.  Prep quality was good. Tortuous colon.  No polyps, masses, vascular ectasias, or inflammatory changes were seen. Findings Hartman's pouch:  Poor anal sphincter tone, otherwise normal digital rectal exam.  Prep quality of good.  Few distal sigmoid diverticula.  Otherwise normal to level of stump of Hartman's pouch.  No polyps, masses, vascular ectasias, or inflammatory changes were seen.  Retroflexed view of rectum revealed internal hemorrhoids otherwise normal. Withdrawal time was 15 minutes     .  The scope was withdrawn and the procedure completed.  ENDOSCOPIC IMPRESSION:     1. As above.  RECOMMENDATIONS:     1.  Watch for potential complications of procedure. 2.  OK from GI perspective to proceed with colostomy take-down. 3.  Repeat colonoscopy, in continuity, in 5 years. 4.  Follow-up with me in office on as-needed basis.  eSigned:  Willis Modena, MD 02/28/2012 2:00 PM   cc:   PATIENT NAME:  Stacy Dalton, Stacy Dalton MR#: [id number]

## 2012-02-28 NOTE — H&P (Signed)
Patient interval history reviewed.  Patient examined again.  There has been no change from documented H/P dated 02/21/12 (scanned into chart from our office) except as documented above.  Assessment:  1.  Colon cancer screening. 2.  Spontaneous colonic perforation (via fecal impaction) with diverting colostomy and Hartman's pouch, April 2013.  Plan:  1.  Colonoscopy via colostomy and Hartman's pouch. 2.  Risks (bleeding, infection, bowel perforation that could require surgery, sedation-related changes in cardiopulmonary systems), benefits (identification and possible treatment of source of symptoms, exclusion of certain causes of symptoms), and alternatives (watchful waiting, radiographic imaging studies, empiric medical treatment) of colonoscopy were explained to patient/husband in detail and patient wishes to proceed.

## 2012-02-28 NOTE — Anesthesia Postprocedure Evaluation (Signed)
  Anesthesia Post-op Note  Patient: Stacy Dalton  Procedure(s) Performed: Procedure(s) (LRB): COLONOSCOPY WITH PROPOFOL (N/A)  Patient Location: PACU  Anesthesia Type: MAC  Level of Consciousness: awake and alert   Airway and Oxygen Therapy: Patient Spontanous Breathing  Post-op Pain: mild  Post-op Assessment: Post-op Vital signs reviewed, Patient's Cardiovascular Status Stable, Respiratory Function Stable, Patent Airway and No signs of Nausea or vomiting  Post-op Vital Signs: stable  Complications: No apparent anesthesia complications

## 2012-02-28 NOTE — Transfer of Care (Signed)
Immediate Anesthesia Transfer of Care Note  Patient: [name]  Procedure(s) Performed: Procedure(s) (LRB): COLONOSCOPY WITH PROPOFOL (N/A)  Patient Location: PACU  Anesthesia Type: MAC  Level of Consciousness: sedated and patient cooperative  Airway & Oxygen Therapy: Patient Spontanous Breathing and Patient connected to nasal cannula oxygen  Post-op Assessment: Report given to PACU RN and Post -op Vital signs reviewed and stable  Post vital signs: stable  Complications: No apparent anesthesia complications

## 2012-02-28 NOTE — Anesthesia Preprocedure Evaluation (Addendum)
Anesthesia Evaluation  Patient identified by MRN, date of birth, ID band Patient awake    Reviewed: Allergy & Precautions, H&P , NPO status , Patient's Chart, lab work & pertinent test results  Airway Mallampati: II TM Distance: >3 FB Neck ROM: full    Dental  (+) Missing and Dental Advisory Given,    Pulmonary COPD breath sounds clear to auscultation  Pulmonary exam normal       Cardiovascular hypertension, Pt. on medications Rhythm:regular Rate:Normal     Neuro/Psych negative neurological ROS  negative psych ROS   GI/Hepatic negative GI ROS, Neg liver ROS,   Endo/Other  negative endocrine ROSHypothyroidism   Renal/GU negative Renal ROS  negative genitourinary   Musculoskeletal   Abdominal   Peds  Hematology negative hematology ROS (+)   Anesthesia Other Findings Systemic inflamatory response syndrome  Reproductive/Obstetrics negative OB ROS                           Anesthesia Physical Anesthesia Plan  ASA: III  Anesthesia Plan: MAC   Post-op Pain Management:    Induction:   Airway Management Planned:   Additional Equipment:   Intra-op Plan:   Post-operative Plan:   Informed Consent: I have reviewed the patients History and Physical, chart, labs and discussed the procedure including the risks, benefits and alternatives for the proposed anesthesia with the patient or authorized representative who has indicated his/her understanding and acceptance.   Dental Advisory Given  Plan Discussed with: Surgeon  Anesthesia Plan Comments:         Anesthesia Quick Evaluation

## 2012-02-28 NOTE — Preoperative (Signed)
Beta Blockers   Reason not to administer Beta Blockers:Not Applicable 

## 2012-03-05 ENCOUNTER — Encounter (INDEPENDENT_AMBULATORY_CARE_PROVIDER_SITE_OTHER): Admitting: General Surgery

## 2012-03-12 ENCOUNTER — Ambulatory Visit (INDEPENDENT_AMBULATORY_CARE_PROVIDER_SITE_OTHER): Admitting: General Surgery

## 2012-03-12 ENCOUNTER — Encounter (INDEPENDENT_AMBULATORY_CARE_PROVIDER_SITE_OTHER): Payer: Self-pay | Admitting: General Surgery

## 2012-03-12 VITALS — BP 124/76 | HR 68 | Temp 97.8°F | Resp 14 | Ht 62.0 in | Wt 126.1 lb

## 2012-03-12 DIAGNOSIS — K631 Perforation of intestine (nontraumatic): Secondary | ICD-10-CM

## 2012-03-12 NOTE — Progress Notes (Signed)
Patient ID: Stacy Dalton, female   DOB: 12/25/1950, 61 y.o.   MRN: [id number]  Chief Complaint  Patient presents with  . Routine Post Op    Exploratory laparotomy 10/04/11    HPI Stacy Dalton is a 61 y.o. female.  This patient returns stating that she would like her colostomy reversed.  On 10/04/2011 she presented with a stercoral perforation of the distal descending colon. She had diffuse peritonitis and multiple stool balls throughout the abdomen. She was septic. She underwent resection of this segment of diseased bowel, proximal diversion and stapling off of the distal segment. I was surprised that she did not develop an abdominal abscess and did not have to have any further drainage procedures.  Her health is back to baseline now. She walks every day. She eats well. She is no longer constipated.   She has been treated for enterococcus urinary tract infection by Dr. Modesto Charon.  Dr. Dulce Sellar performed a colonoscopy per colostomy and per rectum on August 28 and it looked good. A few diverticula were noted in the sigmoid. Considering his findings as well as my operative note I think wde probably have a reasonable distal segment.  Her wound is healing slowly. She has no significant pain. I do not think we need any further imaging studies. HPI  Past Medical History  Diagnosis Date  . Migraine   . Hypertension     Past Surgical History  Procedure Date  . Tubal ligation   . Bladder tac   . Laparotomy 10/04/2011    Procedure: EXPLORATORY LAPAROTOMY;  Surgeon: Ernestene Mention, MD;  Location: [facility];  Service: General;  Laterality: N/A;  left partial colectomy with colostomy    History reviewed. No pertinent family history.  Social History History  Substance Use Topics  . Smoking status: Former Smoker    Quit date: 10/03/1997  . Smokeless tobacco: Never Used  . Alcohol Use: No    Allergies  Allergen Reactions  . Demerol   . Esgic (Butalbital-Apap-Caffeine)   . Iodine Other  (See Comments)    bp bottomed out per pt several hours later; unsure if pre medicated in past with cm; done in [place]    Current Outpatient Prescriptions  Medication Sig Dispense Refill  . acetaminophen (TYLENOL) 325 MG tablet Take 2 tablets (650 mg total) by mouth every 4 (four) hours as needed for fever.      [name] HYDROcodone-acetaminophen (NORCO) 7.5-325 MG per tablet Take 1 tablet by mouth every 6 (six) hours as needed.      [name] levothyroxine (SYNTHROID, LEVOTHROID) 50 MCG tablet Take 50 mcg by mouth daily.      . Multiple Vitamin (MULITIVITAMIN WITH MINERALS) TABS Take 1 tablet by mouth daily.      . Olmesartan-Amlodipine-HCTZ (TRIBENZOR) 40-10-12.5 MG TABS Take by mouth daily. Patient to confirm dosage on next visit.      [name] promethazine (PHENERGAN) 25 MG tablet 1/2 tablet to 1 tablet every 8 hours as needed for nausea with pain medicine.  30 tablet  0  . zolpidem (AMBIEN CR) 12.5 MG CR tablet Take 12.5 mg by mouth at bedtime as needed.        Review of Systems Review of Systems  Constitutional: Negative for fever, chills and unexpected weight change.  HENT: Negative for hearing loss, congestion, sore throat, trouble swallowing and voice change.   Eyes: Negative for visual disturbance.  Respiratory: Negative for cough and wheezing.   Cardiovascular: Negative for chest pain, palpitations  and leg swelling.  Gastrointestinal: Negative for nausea, vomiting, abdominal pain, diarrhea, constipation, blood in stool, abdominal distention and anal bleeding.  Genitourinary: Negative for hematuria, vaginal bleeding and difficulty urinating.  Musculoskeletal: Negative for arthralgias.  Skin: Negative for rash and wound.  Neurological: Negative for seizures, syncope and headaches.  Hematological: Negative for adenopathy. Does not bruise/bleed easily.  Psychiatric/Behavioral: Negative for confusion.    Blood pressure 124/76, pulse 68, temperature 97.8 F (36.6 C), temperature source Temporal, resp.  rate 14, height 5\' 2"  (1.575 m), weight 126 lb 2 oz (57.21 kg).  Physical Exam Physical Exam  Constitutional: She is oriented to person, place, and time. She appears well-developed and well-nourished. No distress.  HENT:  Head: Normocephalic and atraumatic.  Nose: Nose normal.  Mouth/Throat: No oropharyngeal exudate.  Eyes: Conjunctivae and EOM are normal. Pupils are equal, round, and reactive to light. Left eye exhibits no discharge. No scleral icterus.  Neck: Neck supple. No JVD present. No tracheal deviation present. No thyromegaly present.  Cardiovascular: Normal rate, regular rhythm, normal heart sounds and intact distal pulses.   No murmur heard. Pulmonary/Chest: Effort normal and breath sounds normal. No respiratory distress. She has no wheezes. She has no rales. She exhibits no tenderness.  Abdominal: Soft. Bowel sounds are normal. She exhibits no distension and no mass. There is no tenderness. There is no rebound and no guarding.       Left sided colostomy, pink and healthy. Lower midline scar. Very superficial area of open granulating tissue. This is epithelializing rapidly. Silver nitrate applied to the moist granulation tissue. No obvious hernia.  Musculoskeletal: She exhibits no edema and no tenderness.  Lymphadenopathy:    She has no cervical adenopathy.  Neurological: She is alert and oriented to person, place, and time. She exhibits normal muscle tone. Coordination normal.  Skin: Skin is warm. No rash noted. She is not diaphoretic. No erythema. No pallor.  Psychiatric: She has a normal mood and affect. Her behavior is normal. Judgment and thought content normal.    Data Reviewed My chart. Hospital chart. Dr. Hulen Shouts colonoscopy report  Assessment    Stercoral perforation of distal descending colon with diffuse fecal peritonitis. She has now recovered 5 months following laparotomy, resection, and diversion.  Postop SIRS.  COPD  Former smoker, quit  2000  Enterococcus UTI, treated  Hypertension    Plan    Will wait one month and then proceed with a laparotomy and resection and closure of her colostomy.  Bowel prep ordered and given to patient. She will take mechanical prep, antibiotic pills, and enemas per rectum.  Plan entereg protocol  I discussed the indications, details, techniques, and numerous risks of the surgery with the patient her husband. She is aware of the risk of superficial wound infection, deep space infection, anastomotic leak, cardiac and pulmonary problems, injury to adjacent  Organs, and  she is advised of numerous other risks as well. She understands these issues. Her questions were answered. She is in full agreement with this plan.       Angelia Mould. Derrell Lolling, M.D., [facility] General and Minimally invasive Surgery Breast and Colorectal Surgery Office:   [phone] Pager:   [phone]  03/12/2012, 4:49 PM

## 2012-03-12 NOTE — Patient Instructions (Signed)
Your colonoscopy looked good.  You will be scheduled for surgery, which will involve a preoperative bowel prep, admission to the hospital the day of surgery. We will go back in the same incision and put the 2 ends of the colon together. You will need to be in the hospital a few days after the surgery.  My office staff will explain the bowel prep to you. This will involve a gallon of liquid to drink as well as antibiotic pills.  You are instructed to give yourself 2 or 3 enemas per rectum   the day before surgery. These enemas should be 2 cups each enema.

## 2012-04-10 ENCOUNTER — Encounter (HOSPITAL_COMMUNITY): Payer: Self-pay | Admitting: Pharmacy Technician

## 2012-04-15 NOTE — Patient Instructions (Addendum)
18 Stacy Dalton  04/15/2012   Your procedure is scheduled on: 04-19-2012    Report to [facility] at 0530  AM.  Call this number if you have problems the morning of surgery: [phone]   Remember:   Do not eat food or drink liquids:After Midnight.  .  Take these medicines the morning of surgery with A SIP OF WATER: levothryoxine   Do not wear jewelry or make up.  Do not wear lotions, powders, or perfumes. You may wear deodorant.    Do not bring valuables to the hospital.  Contacts, dentures or bridgework may not be worn into surgery.  Leave suitcase in the car. After surgery it may be brought to your room.  For patients admitted to the hospital, checkout time is 11:00 AM the day of discharge                             Patients discharged the day of surgery will not be allowed to drive home. If going home same day of surgery, you must have someone stay with you the first 24 hours at home and arrange for some one to drive you home from hospital.    Special Instructions: See [facility] Preparing for Surgery instruction sheet. Women do not shave legs or underarms for 12 hours before showers. Men may shave face morning of surgery.    Please read over the following fact sheets that you were given: MRSA Information, blood fact sheet  Cain Sieve [facility] pre op nurse phone number [phone], call if needed

## 2012-04-16 ENCOUNTER — Encounter (HOSPITAL_COMMUNITY)
Admission: RE | Admit: 2012-04-16 | Discharge: 2012-04-16 | Disposition: A | Discharge status: Home / Self Care | Source: Ambulatory Visit | Attending: General Surgery | Admitting: General Surgery

## 2012-04-16 ENCOUNTER — Ambulatory Visit (HOSPITAL_COMMUNITY)
Admission: RE | Admit: 2012-04-16 | Discharge: 2012-04-16 | Disposition: A | Discharge status: Home / Self Care | Source: Ambulatory Visit | Attending: General Surgery | Admitting: General Surgery

## 2012-04-16 ENCOUNTER — Encounter (HOSPITAL_COMMUNITY): Payer: Self-pay

## 2012-04-16 DIAGNOSIS — Z933 Colostomy status: Secondary | ICD-10-CM | POA: Insufficient documentation

## 2012-04-16 DIAGNOSIS — I700 Atherosclerosis of aorta: Secondary | ICD-10-CM | POA: Insufficient documentation

## 2012-04-16 DIAGNOSIS — Z01818 Encounter for other preprocedural examination: Secondary | ICD-10-CM | POA: Insufficient documentation

## 2012-04-16 DIAGNOSIS — Z01812 Encounter for preprocedural laboratory examination: Secondary | ICD-10-CM | POA: Insufficient documentation

## 2012-04-16 HISTORY — DX: Gastro-esophageal reflux disease without esophagitis: K21.9

## 2012-04-16 HISTORY — DX: Chronic obstructive pulmonary disease, unspecified: J44.9

## 2012-04-16 HISTORY — DX: Hypothyroidism, unspecified: E03.9

## 2012-04-16 HISTORY — DX: Depression, unspecified: F32.A

## 2012-04-16 HISTORY — DX: Major depressive disorder, single episode, unspecified: F32.9

## 2012-04-16 LAB — CBC WITH DIFFERENTIAL/PLATELET
Basophils Absolute: 0 10*3/uL (ref 0.0–0.1)
Basophils Relative: 0 % (ref 0–1)
Eosinophils Absolute: 0.2 10*3/uL (ref 0.0–0.7)
Eosinophils Relative: 3 % (ref 0–5)
HCT: 37.8 % (ref 36.0–46.0)
Hemoglobin: 12.7 g/dL (ref 12.0–15.0)
Lymphocytes Relative: 39 % (ref 12–46)
Lymphs Abs: 2.1 10*3/uL (ref 0.7–4.0)
MCH: 29.1 pg (ref 26.0–34.0)
MCHC: 33.6 g/dL (ref 30.0–36.0)
MCV: 86.7 fL (ref 78.0–100.0)
Monocytes Absolute: 0.7 10*3/uL (ref 0.1–1.0)
Monocytes Relative: 13 % — ABNORMAL HIGH (ref 3–12)
Neutro Abs: 2.5 10*3/uL (ref 1.7–7.7)
Neutrophils Relative %: 44 % (ref 43–77)
Platelets: 241 10*3/uL (ref 150–400)
RBC: 4.36 MIL/uL (ref 3.87–5.11)
RDW: 13 % (ref 11.5–15.5)
WBC: 5.5 10*3/uL (ref 4.0–10.5)

## 2012-04-16 LAB — URINALYSIS, ROUTINE W REFLEX MICROSCOPIC
Bilirubin Urine: NEGATIVE
Glucose, UA: NEGATIVE mg/dL
Hgb urine dipstick: NEGATIVE
Ketones, ur: NEGATIVE mg/dL
Leukocytes, UA: NEGATIVE
Nitrite: NEGATIVE
Protein, ur: NEGATIVE mg/dL
Specific Gravity, Urine: 1.008 (ref 1.005–1.030)
Urobilinogen, UA: 0.2 mg/dL (ref 0.0–1.0)
pH: 7.5 (ref 5.0–8.0)

## 2012-04-16 LAB — COMPREHENSIVE METABOLIC PANEL
ALT: 17 U/L (ref 0–35)
AST: 19 U/L (ref 0–37)
Albumin: 3.9 g/dL (ref 3.5–5.2)
Alkaline Phosphatase: 82 U/L (ref 39–117)
BUN: 10 mg/dL (ref 6–23)
CO2: 33 mEq/L — ABNORMAL HIGH (ref 19–32)
Calcium: 9.6 mg/dL (ref 8.4–10.5)
Chloride: 97 mEq/L (ref 96–112)
Creatinine, Ser: 0.61 mg/dL (ref 0.50–1.10)
GFR calc Af Amer: >90 mL/min (ref >90)
GFR calc non Af Amer: >90 mL/min (ref >90)
Glucose, Bld: 69 mg/dL — ABNORMAL LOW (ref 70–99)
Potassium: 3.5 mEq/L (ref 3.5–5.1)
Sodium: 138 mEq/L (ref 135–145)
Total Bilirubin: 0.2 mg/dL — ABNORMAL LOW (ref 0.3–1.2)
Total Protein: 7 g/dL (ref 6.0–8.3)

## 2012-04-16 LAB — SURGICAL PCR SCREEN
MRSA, PCR: POSITIVE — AB
Staphylococcus aureus: POSITIVE — AB

## 2012-04-16 LAB — ABO/RH: ABO/RH(D): O POS

## 2012-04-16 NOTE — H&P (Signed)
Stacy Dalton     MRN: [id number]   Description: 61 year old female  Provider: Ernestene Mention, MD  Department: [facility]        Diagnoses     Perforated bowel   - Primary    [id number]      Reason for Visit     Routine Post Op    Exploratory laparotomy 10/04/11        Vitals   BP Pulse Temp Resp Ht Wt    124/76 68 97.8 F (36.6 C) (Temporal) 14 5\' 2"  (1.575 m) 126 lb 2 oz (57.21 kg)    BMI - 23.07 kg/m2                 History and Physical     Ernestene Mention, MD   Patient ID: JAZZMYN FILION, female   DOB: 1950-10-03, 61 y.o.   MRN: [id number]                HPI Stacy Dalton is a 61 y.o. female.  This patient returns stating that she would like her colostomy reversed.   On 10/04/2011 she presented with a stercoral perforation of the distal descending colon. She had diffuse peritonitis and multiple stool balls throughout the abdomen. She was septic. She underwent resection of this segment of diseased bowel, proximal diversion and stapling off of the distal segment. I was surprised that she did not develop an abdominal abscess and did not have to have any further drainage procedures.   Her health is back to baseline now. She walks every day. She eats well. She is no longer constipated.   She has been treated for enterococcus urinary tract infection by Dr. Modesto Charon.   Dr. Dulce Sellar performed a colonoscopy per colostomy and per rectum on August 28 and it looked good. A few diverticula were noted in the sigmoid. Considering his findings as well as my operative note I think we probably have a reasonable distal segment.   Her wound is healing slowly. She has no significant pain. I do not think we need any further imaging studies.      Past Medical History   Diagnosis  Date   .  Migraine     .  Hypertension         Past Surgical History   Procedure  Date   .  Tubal ligation     .  Bladder tac     .  Laparotomy  10/04/2011       Procedure:  EXPLORATORY LAPAROTOMY;  Surgeon: Ernestene Mention, MD;  Location: [facility];  Service: General;  Laterality: N/A;  left partial colectomy with colostomy      History reviewed. No pertinent family history.   Social History History   Substance Use Topics   .  Smoking status:  Former Smoker       Quit date:  10/03/1997   .  Smokeless tobacco:  Never Used   .  Alcohol Use:  No       Allergies   Allergen  Reactions   .  Demerol     .  Esgic (Butalbital-Apap-Caffeine)     .  Iodine  Other (See Comments)       bp bottomed out per pt several hours later; unsure if pre medicated in past with cm; done in [place]       Current Outpatient Prescriptions   Medication  Sig  Dispense  Refill   .  acetaminophen (TYLENOL) 325 MG tablet  Take 2 tablets (650 mg total) by mouth every 4 (four) hours as needed for fever.         [name]  HYDROcodone-acetaminophen (NORCO) 7.5-325 MG per tablet  Take 1 tablet by mouth every 6 (six) hours as needed.         [name]  levothyroxine (SYNTHROID, LEVOTHROID) 50 MCG tablet  Take 50 mcg by mouth daily.         .  Multiple Vitamin (MULITIVITAMIN WITH MINERALS) TABS  Take 1 tablet by mouth daily.         .  Olmesartan-Amlodipine-HCTZ (TRIBENZOR) 40-10-12.5 MG TABS  Take by mouth daily. Patient to confirm dosage on next visit.         [name]  promethazine (PHENERGAN) 25 MG tablet  1/2 tablet to 1 tablet every 8 hours as needed for nausea with pain medicine.   30 tablet   0   .  zolpidem (AMBIEN CR) 12.5 MG CR tablet  Take 12.5 mg by mouth at bedtime as needed.            Review of Systems  Constitutional: Negative for fever, chills and unexpected weight change.  HENT: Negative for hearing loss, congestion, sore throat, trouble swallowing and voice change.   Eyes: Negative for visual disturbance.  Respiratory: Negative for cough and wheezing.   Cardiovascular: Negative for chest pain, palpitations and leg swelling.  Gastrointestinal: Negative for nausea, vomiting, abdominal pain,  diarrhea, constipation, blood in stool, abdominal distention and anal bleeding.  Genitourinary: Negative for hematuria, vaginal bleeding and difficulty urinating.  Musculoskeletal: Negative for arthralgias.  Skin: Negative for rash and wound.  Neurological: Negative for seizures, syncope and headaches.  Hematological: Negative for adenopathy. Does not bruise/bleed easily.  Psychiatric/Behavioral: Negative for confusion.    Blood pressure 124/76, pulse 68, temperature 97.8 F (36.6 C), temperature source Temporal, resp. rate 14, height 5\' 2"  (1.575 m), weight 126 lb 2 oz (57.21 kg).   Physical Exam   Constitutional: She is oriented to person, place, and time. She appears well-developed and well-nourished. No distress.  HENT:   Head: Normocephalic and atraumatic.   Nose: Nose normal.   Mouth/Throat: No oropharyngeal exudate.  Eyes: Conjunctivae and EOM are normal. Pupils are equal, round, and reactive to light. Left eye exhibits no discharge. No scleral icterus.  Neck: Neck supple. No JVD present. No tracheal deviation present. No thyromegaly present.  Cardiovascular: Normal rate, regular rhythm, normal heart sounds and intact distal pulses.    No murmur heard. Pulmonary/Chest: Effort normal and breath sounds normal. No respiratory distress. She has no wheezes. She has no rales. She exhibits no tenderness.  Abdominal: Soft. Bowel sounds are normal. She exhibits no distension and no mass. There is no tenderness. There is no rebound and no guarding.       Left sided colostomy, pink and healthy. Lower midline scar. Very superficial area of open granulating tissue. This is epithelializing rapidly. Silver nitrate applied to the moist granulation tissue. No obvious hernia.  Musculoskeletal: She exhibits no edema and no tenderness.  Lymphadenopathy:    She has no cervical adenopathy.  Neurological: She is alert and oriented to person, place, and time. She exhibits normal muscle tone.  Coordination normal.  Skin: Skin is warm. No rash noted. She is not diaphoretic. No erythema. No pallor.  Psychiatric: She has a normal mood and affect. Her behavior is normal. Judgment and thought content normal.    Data Reviewed My chart.  Hospital chart. Dr. Hulen Shouts colonoscopy report   Assessment Stercoral perforation of distal descending colon with diffuse fecal peritonitis. She has now recovered 5 months following laparotomy, resection, and diversion.   Postop SIRS.   COPD   Former smoker, quit 2000   Enterococcus UTI, treated   Hypertension   Plan Will wait one month and then proceed with a laparotomy and resection and closure of her colostomy.   Bowel prep ordered and given to patient. She will take mechanical prep, antibiotic pills, and enemas per rectum.   Plan entereg protocol   I discussed the indications, details, techniques, and numerous risks of the surgery with the patient her husband. She is aware of the risk of superficial wound infection, deep space infection, anastomotic leak, cardiac and pulmonary problems, injury to adjacent  Organs, and  she is advised of numerous other risks as well. She understands these issues. Her questions were answered. She is in full agreement with this plan.       Angelia Mould. Derrell Lolling, M.D., [facility] General and Minimally invasive Surgery Breast and Colorectal Surgery Office:   [phone] Pager:   [phone]

## 2012-04-17 ENCOUNTER — Telehealth (INDEPENDENT_AMBULATORY_CARE_PROVIDER_SITE_OTHER): Payer: Self-pay

## 2012-04-17 NOTE — Telephone Encounter (Signed)
Pt requested to review colon prep instructions. Instructions reviewed with pt and also to do gentle enema per rectum the night before surgery. Pt states she understands.

## 2012-04-19 ENCOUNTER — Inpatient Hospital Stay (HOSPITAL_COMMUNITY)
Admission: RE | Admit: 2012-04-19 | Discharge: 2012-04-25 | DRG: 330 | Disposition: A | Discharge status: Home Health | Source: Ambulatory Visit | Attending: General Surgery | Admitting: General Surgery

## 2012-04-19 ENCOUNTER — Encounter (HOSPITAL_COMMUNITY): Payer: Self-pay

## 2012-04-19 ENCOUNTER — Encounter (HOSPITAL_COMMUNITY)
Admission: RE | Disposition: A | Discharge status: Home Health | Payer: Self-pay | Source: Ambulatory Visit | Attending: General Surgery

## 2012-04-19 ENCOUNTER — Encounter (HOSPITAL_COMMUNITY): Payer: Self-pay | Admitting: Anesthesiology

## 2012-04-19 ENCOUNTER — Ambulatory Visit (HOSPITAL_COMMUNITY): Admitting: Anesthesiology

## 2012-04-19 DIAGNOSIS — G8929 Other chronic pain: Secondary | ICD-10-CM | POA: Diagnosis present

## 2012-04-19 DIAGNOSIS — Z433 Encounter for attention to colostomy: Principal | ICD-10-CM

## 2012-04-19 DIAGNOSIS — Z9049 Acquired absence of other specified parts of digestive tract: Secondary | ICD-10-CM

## 2012-04-19 DIAGNOSIS — E039 Hypothyroidism, unspecified: Secondary | ICD-10-CM | POA: Diagnosis present

## 2012-04-19 DIAGNOSIS — K5730 Diverticulosis of large intestine without perforation or abscess without bleeding: Secondary | ICD-10-CM | POA: Diagnosis present

## 2012-04-19 DIAGNOSIS — I998 Other disorder of circulatory system: Secondary | ICD-10-CM | POA: Diagnosis present

## 2012-04-19 DIAGNOSIS — I10 Essential (primary) hypertension: Secondary | ICD-10-CM | POA: Diagnosis present

## 2012-04-19 DIAGNOSIS — L905 Scar conditions and fibrosis of skin: Secondary | ICD-10-CM | POA: Diagnosis present

## 2012-04-19 DIAGNOSIS — K560 Paralytic ileus: Secondary | ICD-10-CM | POA: Diagnosis not present

## 2012-04-19 DIAGNOSIS — T8189XA Other complications of procedures, not elsewhere classified, initial encounter: Secondary | ICD-10-CM

## 2012-04-19 DIAGNOSIS — Z87440 Personal history of urinary (tract) infections: Secondary | ICD-10-CM

## 2012-04-19 DIAGNOSIS — J449 Chronic obstructive pulmonary disease, unspecified: Secondary | ICD-10-CM | POA: Diagnosis present

## 2012-04-19 DIAGNOSIS — Y836 Removal of other organ (partial) (total) as the cause of abnormal reaction of the patient, or of later complication, without mention of misadventure at the time of the procedure: Secondary | ICD-10-CM | POA: Diagnosis not present

## 2012-04-19 DIAGNOSIS — J4489 Other specified chronic obstructive pulmonary disease: Secondary | ICD-10-CM | POA: Diagnosis present

## 2012-04-19 DIAGNOSIS — Z888 Allergy status to other drugs, medicaments and biological substances status: Secondary | ICD-10-CM

## 2012-04-19 DIAGNOSIS — F329 Major depressive disorder, single episode, unspecified: Secondary | ICD-10-CM | POA: Diagnosis present

## 2012-04-19 DIAGNOSIS — F411 Generalized anxiety disorder: Secondary | ICD-10-CM | POA: Diagnosis present

## 2012-04-19 DIAGNOSIS — Z933 Colostomy status: Secondary | ICD-10-CM

## 2012-04-19 DIAGNOSIS — M549 Dorsalgia, unspecified: Secondary | ICD-10-CM | POA: Diagnosis present

## 2012-04-19 DIAGNOSIS — K432 Incisional hernia without obstruction or gangrene: Secondary | ICD-10-CM | POA: Diagnosis present

## 2012-04-19 DIAGNOSIS — Z79899 Other long term (current) drug therapy: Secondary | ICD-10-CM

## 2012-04-19 DIAGNOSIS — K929 Disease of digestive system, unspecified: Secondary | ICD-10-CM | POA: Diagnosis not present

## 2012-04-19 DIAGNOSIS — Z87891 Personal history of nicotine dependence: Secondary | ICD-10-CM

## 2012-04-19 DIAGNOSIS — K660 Peritoneal adhesions (postprocedural) (postinfection): Secondary | ICD-10-CM | POA: Diagnosis present

## 2012-04-19 DIAGNOSIS — K219 Gastro-esophageal reflux disease without esophagitis: Secondary | ICD-10-CM | POA: Diagnosis present

## 2012-04-19 DIAGNOSIS — F3289 Other specified depressive episodes: Secondary | ICD-10-CM | POA: Diagnosis present

## 2012-04-19 HISTORY — PX: VENTRAL HERNIA REPAIR: SHX424

## 2012-04-19 HISTORY — PX: LAPAROTOMY: SHX154

## 2012-04-19 HISTORY — PX: COLOSTOMY CLOSURE: SHX1381

## 2012-04-19 LAB — TYPE AND SCREEN
ABO/RH(D): O POS
Antibody Screen: NEGATIVE

## 2012-04-19 SURGERY — LAPAROTOMY, EXPLORATORY
Anesthesia: General | Site: Abdomen | Wound class: Contaminated

## 2012-04-19 MED ORDER — HYDROCHLOROTHIAZIDE 12.5 MG PO CAPS
12.5000 mg | ORAL_CAPSULE | Freq: Every day | ORAL | Status: DC
  Administered 2012-04-19 – 2012-04-21 (×3): 12.5 mg via ORAL
  Filled 2012-04-19 (×3): qty 1

## 2012-04-19 MED ORDER — HEPARIN SODIUM (PORCINE) 5000 UNIT/ML IJ SOLN
5000.0000 [IU] | Freq: Once | INTRAMUSCULAR | Status: AC
  Administered 2012-04-19: 5000 [IU] via SUBCUTANEOUS
  Filled 2012-04-19: qty 1

## 2012-04-19 MED ORDER — FENTANYL CITRATE 0.05 MG/ML IJ SOLN
INTRAMUSCULAR | Status: DC | PRN
  Administered 2012-04-19 (×2): 50 ug via INTRAVENOUS
  Administered 2012-04-19: 100 ug via INTRAVENOUS
  Administered 2012-04-19 (×2): 50 ug via INTRAVENOUS
  Administered 2012-04-19: 100 ug via INTRAVENOUS

## 2012-04-19 MED ORDER — PROMETHAZINE HCL 25 MG/ML IJ SOLN
INTRAMUSCULAR | Status: AC
  Filled 2012-04-19: qty 1

## 2012-04-19 MED ORDER — METOCLOPRAMIDE HCL 5 MG/ML IJ SOLN
INTRAMUSCULAR | Status: DC | PRN
  Administered 2012-04-19: 10 mg via INTRAVENOUS

## 2012-04-19 MED ORDER — DEXAMETHASONE SODIUM PHOSPHATE 10 MG/ML IJ SOLN
INTRAMUSCULAR | Status: DC | PRN
  Administered 2012-04-19: 5 mg via INTRAVENOUS

## 2012-04-19 MED ORDER — LABETALOL HCL 5 MG/ML IV SOLN
INTRAVENOUS | Status: DC | PRN
  Administered 2012-04-19 (×3): 5 mg via INTRAVENOUS

## 2012-04-19 MED ORDER — HYDROMORPHONE HCL PF 1 MG/ML IJ SOLN
0.2500 mg | INTRAMUSCULAR | Status: DC | PRN
Start: 2012-04-19 — End: 2012-04-19
  Administered 2012-04-19 (×3): 0.25 mg via INTRAVENOUS

## 2012-04-19 MED ORDER — MIDAZOLAM HCL 5 MG/5ML IJ SOLN
INTRAMUSCULAR | Status: DC | PRN
  Administered 2012-04-19 (×2): 1 mg via INTRAVENOUS

## 2012-04-19 MED ORDER — ALVIMOPAN 12 MG PO CAPS
12.0000 mg | ORAL_CAPSULE | Freq: Two times a day (BID) | ORAL | Status: DC
  Administered 2012-04-20 – 2012-04-24 (×10): 12 mg via ORAL
  Filled 2012-04-19 (×12): qty 1

## 2012-04-19 MED ORDER — SODIUM CHLORIDE 0.9 % IR SOLN
Status: DC | PRN
  Administered 2012-04-19: 2000 mL

## 2012-04-19 MED ORDER — ONDANSETRON HCL 4 MG/2ML IJ SOLN
4.0000 mg | Freq: Four times a day (QID) | INTRAMUSCULAR | Status: DC | PRN
  Administered 2012-04-19 – 2012-04-20 (×4): 4 mg via INTRAVENOUS
  Filled 2012-04-19 (×4): qty 2

## 2012-04-19 MED ORDER — HYDROMORPHONE HCL PF 1 MG/ML IJ SOLN
INTRAMUSCULAR | Status: AC
  Filled 2012-04-19: qty 1

## 2012-04-19 MED ORDER — LACTATED RINGERS IV SOLN: INTRAVENOUS | Status: DC

## 2012-04-19 MED ORDER — SODIUM CHLORIDE 0.9 % IV SOLN
1.0000 g | INTRAVENOUS | Status: AC
  Administered 2012-04-20: 1 g via INTRAVENOUS
  Filled 2012-04-19: qty 1

## 2012-04-19 MED ORDER — ACETAMINOPHEN 10 MG/ML IV SOLN
INTRAVENOUS | Status: DC | PRN
  Administered 2012-04-19: 1000 mg via INTRAVENOUS

## 2012-04-19 MED ORDER — IRBESARTAN 300 MG PO TABS
300.0000 mg | ORAL_TABLET | Freq: Every day | ORAL | Status: DC
  Administered 2012-04-19 – 2012-04-21 (×3): 300 mg via ORAL
  Filled 2012-04-19 (×3): qty 1

## 2012-04-19 MED ORDER — LEVOTHYROXINE SODIUM 75 MCG PO TABS
75.0000 ug | ORAL_TABLET | Freq: Every day | ORAL | Status: DC
  Administered 2012-04-20 – 2012-04-25 (×6): 75 ug via ORAL
  Filled 2012-04-19 (×7): qty 1

## 2012-04-19 MED ORDER — PROMETHAZINE HCL 25 MG/ML IJ SOLN
6.2500 mg | INTRAMUSCULAR | Status: AC | PRN
  Administered 2012-04-19 (×2): 6.25 mg via INTRAVENOUS

## 2012-04-19 MED ORDER — ALVIMOPAN 12 MG PO CAPS
12.0000 mg | ORAL_CAPSULE | Freq: Once | ORAL | Status: AC
  Administered 2012-04-19: 12 mg via ORAL
  Filled 2012-04-19: qty 1

## 2012-04-19 MED ORDER — ROCURONIUM BROMIDE 100 MG/10ML IV SOLN
INTRAVENOUS | Status: DC | PRN
  Administered 2012-04-19: 15 mg via INTRAVENOUS
  Administered 2012-04-19: 50 mg via INTRAVENOUS
  Administered 2012-04-19: 15 mg via INTRAVENOUS

## 2012-04-19 MED ORDER — ADULT MULTIVITAMIN W/MINERALS CH
1.0000 | ORAL_TABLET | Freq: Every day | ORAL | Status: DC
  Administered 2012-04-24: 1 via ORAL
  Filled 2012-04-19 (×7): qty 1

## 2012-04-19 MED ORDER — SODIUM CHLORIDE 0.9 % IV SOLN
1.0000 g | INTRAVENOUS | Status: AC
  Administered 2012-04-19: 1 g via INTRAVENOUS
  Filled 2012-04-19: qty 1

## 2012-04-19 MED ORDER — LIDOCAINE HCL (CARDIAC) 20 MG/ML IV SOLN
INTRAVENOUS | Status: DC | PRN
  Administered 2012-04-19: 50 mg via INTRAVENOUS

## 2012-04-19 MED ORDER — ONDANSETRON HCL 4 MG PO TABS: 4.0000 mg | ORAL_TABLET | Freq: Four times a day (QID) | ORAL | Status: DC | PRN

## 2012-04-19 MED ORDER — HYDROCODONE-ACETAMINOPHEN 5-325 MG PO TABS
1.0000 | ORAL_TABLET | ORAL | Status: DC | PRN
  Administered 2012-04-23 – 2012-04-24 (×4): 2 via ORAL
  Filled 2012-04-19 (×2): qty 2
  Filled 2012-04-19: qty 1
  Filled 2012-04-19 (×2): qty 2

## 2012-04-19 MED ORDER — POTASSIUM CHLORIDE IN NACL 20-0.9 MEQ/L-% IV SOLN
INTRAVENOUS | Status: DC
  Administered 2012-04-19 (×2): via INTRAVENOUS
  Administered 2012-04-20: 125 mL/h via INTRAVENOUS
  Administered 2012-04-20 (×2): via INTRAVENOUS
  Administered 2012-04-21: 125 mL/h via INTRAVENOUS
  Administered 2012-04-21: 03:00:00 via INTRAVENOUS
  Administered 2012-04-21: 125 mL/h via INTRAVENOUS
  Administered 2012-04-22 – 2012-04-24 (×5): via INTRAVENOUS
  Filled 2012-04-19 (×15): qty 1000

## 2012-04-19 MED ORDER — LACTATED RINGERS IV SOLN
INTRAVENOUS | Status: DC | PRN
  Administered 2012-04-19 (×3): via INTRAVENOUS

## 2012-04-19 MED ORDER — ONDANSETRON HCL 4 MG/2ML IJ SOLN
INTRAMUSCULAR | Status: DC | PRN
  Administered 2012-04-19: 4 mg via INTRAVENOUS

## 2012-04-19 MED ORDER — ACETAMINOPHEN 10 MG/ML IV SOLN
INTRAVENOUS | Status: AC
  Filled 2012-04-19: qty 100

## 2012-04-19 MED ORDER — HYDROMORPHONE HCL PF 1 MG/ML IJ SOLN
INTRAMUSCULAR | Status: DC | PRN
  Administered 2012-04-19: 1 mg via INTRAVENOUS
  Administered 2012-04-19 (×3): 0.5 mg via INTRAVENOUS

## 2012-04-19 MED ORDER — HEPARIN SODIUM (PORCINE) 5000 UNIT/ML IJ SOLN
5000.0000 [IU] | Freq: Three times a day (TID) | INTRAMUSCULAR | Status: DC
  Administered 2012-04-20 – 2012-04-25 (×16): 5000 [IU] via SUBCUTANEOUS
  Filled 2012-04-19 (×19): qty 1

## 2012-04-19 MED ORDER — PROPOFOL 10 MG/ML IV BOLUS
INTRAVENOUS | Status: DC | PRN
  Administered 2012-04-19: 170 mg via INTRAVENOUS

## 2012-04-19 MED ORDER — NEOSTIGMINE METHYLSULFATE 1 MG/ML IJ SOLN
INTRAMUSCULAR | Status: DC | PRN
  Administered 2012-04-19: 3.5 mg via INTRAVENOUS

## 2012-04-19 MED ORDER — POTASSIUM CHLORIDE IN NACL 20-0.9 MEQ/L-% IV SOLN
INTRAVENOUS | Status: AC
  Administered 2012-04-19: 11:00:00
  Filled 2012-04-19: qty 1000

## 2012-04-19 MED ORDER — VITAMIN C 500 MG PO TABS
500.0000 mg | ORAL_TABLET | Freq: Every day | ORAL | Status: DC
  Administered 2012-04-24: 500 mg via ORAL
  Filled 2012-04-19 (×7): qty 1

## 2012-04-19 MED ORDER — HYDRALAZINE HCL 20 MG/ML IJ SOLN
INTRAMUSCULAR | Status: DC | PRN
  Administered 2012-04-19: 5 mg via INTRAVENOUS

## 2012-04-19 MED ORDER — SODIUM CHLORIDE 0.9 % IV SOLN
INTRAVENOUS | Status: AC
  Filled 2012-04-19: qty 1

## 2012-04-19 MED ORDER — OLMESARTAN-AMLODIPINE-HCTZ 40-10-12.5 MG PO TABS: 1.0000 | ORAL_TABLET | Freq: Every morning | ORAL | Status: DC

## 2012-04-19 MED ORDER — MORPHINE SULFATE 2 MG/ML IJ SOLN
2.0000 mg | INTRAMUSCULAR | Status: DC | PRN
  Administered 2012-04-19 – 2012-04-20 (×14): 2 mg via INTRAVENOUS
  Filled 2012-04-19 (×14): qty 1

## 2012-04-19 MED ORDER — GLYCOPYRROLATE 0.2 MG/ML IJ SOLN
INTRAMUSCULAR | Status: DC | PRN
  Administered 2012-04-19: 0.3 mg via INTRAVENOUS

## 2012-04-19 SURGICAL SUPPLY — 57 items
APPLICATOR COTTON TIP 6IN STRL (MISCELLANEOUS) ×4 IMPLANT
BLADE EXTENDED COATED 6.5IN (ELECTRODE) ×1 IMPLANT
BLADE HEX COATED 2.75 (ELECTRODE) ×2 IMPLANT
BLADE SURG SZ10 CARB STEEL (BLADE) ×2 IMPLANT
CANISTER SUCTION 2500CC (MISCELLANEOUS) ×3 IMPLANT
CLIP TI LARGE 6 (CLIP) IMPLANT
CLOTH BEACON ORANGE TIMEOUT ST (SAFETY) ×2 IMPLANT
COVER MAYO STAND STRL (DRAPES) ×2 IMPLANT
DRAPE LAPAROSCOPIC ABDOMINAL (DRAPES) ×2 IMPLANT
DRAPE LG THREE QUARTER DISP (DRAPES) ×1 IMPLANT
DRAPE POUCH INSTRU U-SHP 10X18 (DRAPES) ×2 IMPLANT
DRAPE WARM FLUID 44X44 (DRAPE) ×2 IMPLANT
DRESSING TELFA 8X3 (GAUZE/BANDAGES/DRESSINGS) ×1 IMPLANT
DRSG PAD ABDOMINAL 8X10 ST (GAUZE/BANDAGES/DRESSINGS) ×2 IMPLANT
ELECT REM PT RETURN 9FT ADLT (ELECTROSURGICAL) ×2
ELECTRODE REM PT RTRN 9FT ADLT (ELECTROSURGICAL) ×1 IMPLANT
GLOVE BIOGEL PI IND STRL 7.0 (GLOVE) ×1 IMPLANT
GLOVE BIOGEL PI INDICATOR 7.0 (GLOVE) ×1
GLOVE EUDERMIC 7 POWDERFREE (GLOVE) ×2 IMPLANT
GOWN STRL NON-REIN LRG LVL3 (GOWN DISPOSABLE) ×2 IMPLANT
GOWN STRL REIN XL XLG (GOWN DISPOSABLE) ×7 IMPLANT
HAND ACTIVATED (MISCELLANEOUS) ×1 IMPLANT
KIT BASIN OR (CUSTOM PROCEDURE TRAY) ×2 IMPLANT
LEGGING LITHOTOMY PAIR STRL (DRAPES) ×1 IMPLANT
NS IRRIG 1000ML POUR BTL (IV SOLUTION) ×7 IMPLANT
PACK GENERAL/GYN (CUSTOM PROCEDURE TRAY) ×2 IMPLANT
RELOAD PROXIMATE 75MM BLUE (ENDOMECHANICALS) ×4 IMPLANT
RELOAD STAPLE 75 3.8 BLU REG (ENDOMECHANICALS) IMPLANT
SPONGE GAUZE 4X4 12PLY (GAUZE/BANDAGES/DRESSINGS) ×3 IMPLANT
SPONGE LAP 18X18 X RAY DECT (DISPOSABLE) ×4 IMPLANT
STAPLER GUN LINEAR PROX 60 (STAPLE) ×1 IMPLANT
STAPLER PROXIMATE 75MM BLUE (STAPLE) ×1 IMPLANT
STAPLER VISISTAT 35W (STAPLE) ×2 IMPLANT
SUCTION POOLE TIP (SUCTIONS) ×3 IMPLANT
SUT NOV 1 T60/GS (SUTURE) IMPLANT
SUT NOVA 1 T20/GS 25DT (SUTURE) ×3 IMPLANT
SUT NOVA NAB DX-16 0-1 5-0 T12 (SUTURE) IMPLANT
SUT NOVA T20/GS 25 (SUTURE) ×3 IMPLANT
SUT PDS AB 1 CTX 36 (SUTURE) ×3 IMPLANT
SUT PDS AB 1 TP1 96 (SUTURE) ×2 IMPLANT
SUT PROLENE 2 0 KS (SUTURE) IMPLANT
SUT SILK 2 0 (SUTURE) ×2
SUT SILK 2 0 SH CR/8 (SUTURE) ×2 IMPLANT
SUT SILK 2 0SH CR/8 30 (SUTURE) IMPLANT
SUT SILK 2-0 18XBRD TIE 12 (SUTURE) ×1 IMPLANT
SUT SILK 3 0 (SUTURE) ×2
SUT SILK 3 0 SH CR/8 (SUTURE) ×3 IMPLANT
SUT SILK 3-0 18XBRD TIE 12 (SUTURE) ×1 IMPLANT
SUT VIC AB 2-0 SH 27 (SUTURE) ×2
SUT VIC AB 2-0 SH 27X BRD (SUTURE) IMPLANT
SUT VICRYL 2 0 18  UND BR (SUTURE)
SUT VICRYL 2 0 18 UND BR (SUTURE) IMPLANT
TAPE CLOTH SURG 4X10 WHT LF (GAUZE/BANDAGES/DRESSINGS) ×1 IMPLANT
TOWEL OR 17X26 10 PK STRL BLUE (TOWEL DISPOSABLE) ×4 IMPLANT
TRAY FOLEY CATH 14FRSI W/METER (CATHETERS) ×2 IMPLANT
WATER STERILE IRR 1500ML POUR (IV SOLUTION) IMPLANT
YANKAUER SUCT BULB TIP NO VENT (SUCTIONS) ×3 IMPLANT

## 2012-04-19 NOTE — Anesthesia Preprocedure Evaluation (Addendum)
Anesthesia Evaluation  Patient identified by MRN, date of birth, ID band Patient awake    Reviewed: Allergy & Precautions, H&P , NPO status , Patient's Chart, lab work & pertinent test results  Airway Mallampati: II TM Distance: >3 FB Neck ROM: full    Dental  (+) Missing, Dental Advisory Given and Partial Upper,    Pulmonary shortness of breath and with exertion, COPD breath sounds clear to auscultation  Pulmonary exam normal       Cardiovascular Exercise Tolerance: Good hypertension, Rhythm:regular Rate:Normal  Early sepsis   Neuro/Psych  Headaches, Anxiety Depression    GI/Hepatic Neg liver ROS, GERD-  ,Perforated bowel with free air   Endo/Other  Hypothyroidism   Renal/GU negative Renal ROS  negative genitourinary   Musculoskeletal   Abdominal   Peds  Hematology negative hematology ROS (+)   Anesthesia Other Findings   Reproductive/Obstetrics negative OB ROS                          Anesthesia Physical Anesthesia Plan  ASA: II  Anesthesia Plan: General   Post-op Pain Management:    Induction: Intravenous  Airway Management Planned: Oral ETT  Additional Equipment:   Intra-op Plan:   Post-operative Plan: Extubation in OR  Informed Consent: I have reviewed the patients History and Physical, chart, labs and discussed the procedure including the risks, benefits and alternatives for the proposed anesthesia with the patient or authorized representative who has indicated his/her understanding and acceptance.   Dental advisory given  Plan Discussed with: CRNA  Anesthesia Plan Comments:         Anesthesia Quick Evaluation

## 2012-04-19 NOTE — Transfer of Care (Signed)
Immediate Anesthesia Transfer of Care Note  Patient: Stacy Dalton  Procedure(s) Performed: Procedure(s) (LRB) with comments: EXPLORATORY LAPAROTOMY (N/A) COLOSTOMY CLOSURE (N/A) - Laparotomy, Resection and Closure of Colostomy HERNIA REPAIR VENTRAL ADULT (N/A)  Patient Location: PACU  Anesthesia Type: General  Level of Consciousness: awake, alert  and patient cooperative  Airway & Oxygen Therapy: Patient Spontanous Breathing and Patient connected to face mask oxygen  Post-op Assessment: Report given to PACU RN, Post -op Vital signs reviewed and stable and Patient moving all extremities  Post vital signs: Reviewed and stable  Complications: No apparent anesthesia complications

## 2012-04-19 NOTE — Interval H&P Note (Signed)
History and Physical Interval Note:  04/19/2012 7:10 AM  Stacy Dalton  has presented today for surgery, with the diagnosis of colostomy  The goals and the  various methods of treatment have been discussed with the patient and family. After consideration of risks, benefits and other options for treatment, the patient has consented to  Procedure(s) (LRB) with comments: EXPLORATORY LAPAROTOMY (N/A) COLOSTOMY CLOSURE (N/A) - Laparotomy, Resection and Closure of Colostomy as a surgical intervention .  The patient's history has been reviewed, patient examined, no change in status, stable for surgery.  I have reviewed the patient's chart and labs.  Questions were answered to the patient's satisfaction.     Ernestene Mention

## 2012-04-19 NOTE — Op Note (Addendum)
Patient Name:           Stacy Dalton   Date of Surgery:        04/19/2012  Pre op Diagnosis:      Descending colostomy, status post colonic perforation  Post op Diagnosis:    Descending colostomy, status post colonic perforation, ventral  hernia  Procedure:                 Exploratory laparotomy, resection and closure of colostomy, ventral hernia repair, excision and debridement of skin and subcutaneous tissue, 25 cm square area  Surgeon:                     Angelia Mould. Derrell Lolling, M.D., FACS  Assistant:                      Darnell Level, M.D., [facility]   Operative Indications:   On 10/04/2011 she presented with a stercoral perforation of the distal descending colon. She had diffuse peritonitis and multiple stool balls throughout the abdomen. She was septic. She underwent resection of this segment of diseased bowel, proximal diversion and stapling off of the distal segment. I was surprised that she did not develop an abdominal abscess and did not have to have any further drainage procedures.  Her health is back to baseline now. She walks every day. She eats well. She is no longer constipated. She has been treated for enterococcus urinary tract infection by Dr. Modesto Charon.  Dr. Dulce Sellar performed a colonoscopy per colostomy and per rectum on August 28 and it looked good. A few diverticula were noted in the sigmoid. Considering his findings as well as my operative note I think we probably have a reasonable distal segment. Her wound is healing slowly,but still has open granulation tissue in the lower inner. The abdominal muscles are weak in the lower aspect of the incision I suspect hernia formation. She has no significant pain. She has been counseled as an outpatient. She is brought to the operating room electively for resection and closure of her colostomy and possible hernia repair   Operative Findings:       There were moderate but soft chronic adhesions throughout the abdominal cavity. We ultimately did  trace the small bowel from the ligament of Treitz all the way to the ileocecal valve. The appendix,cecum,ascending  colon, transverse colon, descending colon and distal rectosigmoid looked normal. We had a significant distal segment which looked healthy. We were able to perform a stapled anastomosis. She had significant separation of her abdominal fascia in the lower abdomen which required mobilization and debridement and primary closure. We had to resect a significant amount of skin especially on the right side because of chronic scarring and ischemia.  Procedure in Detail:          Following the induction of general endotracheal anesthesia, an oral gastric tube and Foley catheter were placed. A pursestring suture of 2-0 silk was placed in the colostomy. The abdomen was prepped and draped in a sterile fashion. Surgical time out was performed. Intravenous antibiotics were given.  Midline incision was made, completely excising the old scar. Dissection was carried down to subcutaneous tissue where we encountered a hernia sac in the lower abdomen. We slowly debrided the skin and subcutaneous tissue away and slowly opened up the fascia. I Took down all the omental adhesions from the undersurface of the abdominal wall. We then explored the abdomen to get all the adhesions taken down  and  identify the colostomy and the distal segment and  mobilize this up. We took a knife and cut out the colostomy from the abdominal wall, dissected this  away from the abdominal wall muscles are returned into the abdominal cavity. At this point we could see the proximal and distal segments and we could see the continuity of the mesentery of the proximal and distal segments. We resected about 4 inches of colon proximally and 4 inches of colon distally using a GIA stapling device and sent both segments to the lab. We created an anastomosis between the proximal and distal colonic segments with the GIA stapling device. The common defect  was closed with a TA 60 stapling device. We placed some 3-0 silk sutures a critical points along the staple lines. We changed our gloves and instruments. We closed the mesentery with interrupted figure-of-eight sutures of 3-0 silk. We irrigated out the abdomen and pelvis extensively. We checked the anastomosis and it looked good,  pink and healthy. There was no bleeding anywhere. We returned the colon and small bowel to the anatomic position. We returned the omentum to its anatomic positions. We then mobilized the lower abdominal fascia by underming the subcutaneous tissue with the electrocautery. I debrided some thinned out abdominal wall fascia I then found that we could close the fascia in the midline. We closed the fascial in the midline with a running suture of #1 double-stranded PDS and several interrupted sutures of #1 Novofil. We found that the  the skin of the right lower incision was ischemic and with electrocautery excised about 25 cm area of the skin. We irrigated out the wound and then closed the skin loosely with several staples. .  We closed the colostomy site fascia with numerous interrupted sutures of #1 Novofil and irrigated that out. We loosely  closed the skin of the colostomy site with 2 staples.  All the incisions were packed with Telfa wicks to promote drainage.  The patient tolerated the procedure well and was taken recovery room in stable condition. EBL 100 cc. Counts correct. Complications none.     Angelia Mould. Derrell Lolling, M.D., FACS General and Minimally Invasive Surgery Breast and Colorectal Surgery  04/19/2012 9:36 AM

## 2012-04-19 NOTE — Anesthesia Postprocedure Evaluation (Signed)
Anesthesia Post Note  Patient: Stacy Dalton  Procedure(s) Performed: Procedure(s) (LRB): EXPLORATORY LAPAROTOMY (N/A) COLOSTOMY CLOSURE (N/A) HERNIA REPAIR VENTRAL ADULT (N/A)  Anesthesia type: General  Patient location: PACU  Post pain: Pain level controlled  Post assessment: Post-op Vital signs reviewed  Last Vitals:  Filed Vitals:   04/19/12 1320  BP: 158/79  Pulse: 85  Temp: 37 C  Resp: 18    Post vital signs: Reviewed  Level of consciousness: sedated  Complications: No apparent anesthesia complications

## 2012-04-20 LAB — CBC
HCT: 37.7 % (ref 36.0–46.0)
Hemoglobin: 12.8 g/dL (ref 12.0–15.0)
MCH: 29.2 pg (ref 26.0–34.0)
MCHC: 34 g/dL (ref 30.0–36.0)
MCV: 85.9 fL (ref 78.0–100.0)
Platelets: 246 10*3/uL (ref 150–400)
RBC: 4.39 MIL/uL (ref 3.87–5.11)
RDW: 13.4 % (ref 11.5–15.5)
WBC: 15.9 10*3/uL — ABNORMAL HIGH (ref 4.0–10.5)

## 2012-04-20 LAB — BASIC METABOLIC PANEL WITH GFR
BUN: 5 mg/dL — ABNORMAL LOW (ref 6–23)
CO2: 28 meq/L (ref 19–32)
Calcium: 8.9 mg/dL (ref 8.4–10.5)
Chloride: 100 meq/L (ref 96–112)
Creatinine, Ser: 0.63 mg/dL (ref 0.50–1.10)
GFR calc Af Amer: >90 mL/min (ref >90)
GFR calc non Af Amer: >90 mL/min (ref >90)
Glucose, Bld: 126 mg/dL — ABNORMAL HIGH (ref 70–99)
Potassium: 3.6 meq/L (ref 3.5–5.1)
Sodium: 137 meq/L (ref 135–145)

## 2012-04-20 MED ORDER — DIPHENHYDRAMINE HCL 50 MG/ML IJ SOLN: 12.5000 mg | Freq: Four times a day (QID) | INTRAMUSCULAR | Status: DC | PRN

## 2012-04-20 MED ORDER — NALOXONE HCL 0.4 MG/ML IJ SOLN: 0.4000 mg | INTRAMUSCULAR | Status: DC | PRN

## 2012-04-20 MED ORDER — DIPHENHYDRAMINE HCL 12.5 MG/5ML PO ELIX: 12.5000 mg | ORAL_SOLUTION | Freq: Four times a day (QID) | ORAL | Status: DC | PRN

## 2012-04-20 MED ORDER — MORPHINE SULFATE (PF) 1 MG/ML IV SOLN
INTRAVENOUS | Status: DC
  Administered 2012-04-20: 21 mg via INTRAVENOUS
  Administered 2012-04-20: 18 mg via INTRAVENOUS
  Administered 2012-04-20 (×3): via INTRAVENOUS
  Administered 2012-04-20: 14 mg via INTRAVENOUS
  Administered 2012-04-20: 7 mg via INTRAVENOUS
  Administered 2012-04-21: 10 mg via INTRAVENOUS
  Administered 2012-04-21: 16.65 mg via INTRAVENOUS
  Administered 2012-04-21 (×3): via INTRAVENOUS
  Administered 2012-04-21: 13 mg via INTRAVENOUS
  Administered 2012-04-21: 19 mg via INTRAVENOUS
  Administered 2012-04-21: 10.88 mg via INTRAVENOUS
  Administered 2012-04-21: 10.83 mg via INTRAVENOUS
  Administered 2012-04-21: 01:00:00 via INTRAVENOUS
  Administered 2012-04-22: 10 mg via INTRAVENOUS
  Administered 2012-04-22: 8 mg via INTRAVENOUS
  Administered 2012-04-22: 11.62 mg via INTRAVENOUS
  Administered 2012-04-22 (×2): via INTRAVENOUS
  Administered 2012-04-22: 12 mg via INTRAVENOUS
  Administered 2012-04-22: 16:00:00 via INTRAVENOUS
  Administered 2012-04-22: 2 mg via INTRAVENOUS
  Administered 2012-04-22: 4 mg via INTRAVENOUS
  Administered 2012-04-22: 13 mg via INTRAVENOUS
  Administered 2012-04-23: 9 mg via INTRAVENOUS
  Filled 2012-04-20 (×10): qty 25

## 2012-04-20 MED ORDER — SODIUM CHLORIDE 0.9 % IJ SOLN: 9.0000 mL | INTRAMUSCULAR | Status: DC | PRN

## 2012-04-20 MED ORDER — PROMETHAZINE HCL 25 MG/ML IJ SOLN
12.5000 mg | Freq: Four times a day (QID) | INTRAMUSCULAR | Status: DC | PRN
  Administered 2012-04-20 – 2012-04-24 (×14): 12.5 mg via INTRAVENOUS
  Filled 2012-04-20 (×14): qty 1

## 2012-04-20 NOTE — Progress Notes (Signed)
Patient ID: Stacy Dalton, female   DOB: 1950-10-24, 61 y.o.   MRN: [id number] [facility] Progress Note:   1 Day Post-Op  Subjective: Mental status is clear Objective: Vital signs in last 24 hours: Temp:  [97.5 F (36.4 C)-99.3 F (37.4 C)] 98.7 F (37.1 C) (10/19 0630) Pulse Rate:  [63-102] 102  (10/19 0630) Resp:  [11-18] 16  (10/19 0630) BP: (107-165)/(64-81) 153/76 mmHg (10/19 0630) SpO2:  [94 %-100 %] 94 % (10/19 0630) Weight:  [134 lb (60.782 kg)] 134 lb (60.782 kg) (10/18 1120)  Intake/Output from previous day: 10/18 0701 - 10/19 0700 In: 3408.3 [P.O.:100; I.V.:3308.3] Out: 4125 [Urine:3925; Blood:200] Intake/Output this shift:    Physical Exam: Work of breathing is normal.  Back pain which she normally has is worse and is requiring frequent injections of morphine but patient is on MRSA isolation so it is more difficult for her to be seen by nursing.  Large abdominal dressing is intact and the wound with wicks in place was not disturbed.  No flatus.  She states that Zofran isn't working and requests phenergan.  Lab Results:  Results for orders placed during the hospital encounter of 04/19/12 (from the past 48 hour(s))  BASIC METABOLIC PANEL     Status: Abnormal   Collection Time   04/20/12  4:12 AM      Component Value Range Comment   Sodium 137  135 - 145 mEq/L    Potassium 3.6  3.5 - 5.1 mEq/L    Chloride 100  96 - 112 mEq/L    CO2 28  19 - 32 mEq/L    Glucose, Bld 126 (*) 70 - 99 mg/dL    BUN 5 (*) 6 - 23 mg/dL    Creatinine, Ser 4.09  0.50 - 1.10 mg/dL    Calcium 8.9  8.4 - 81.1 mg/dL    GFR calc non Af Amer >90  >90 mL/min    GFR calc Af Amer >90  >90 mL/min   CBC     Status: Abnormal   Collection Time   04/20/12  4:12 AM      Component Value Range Comment   WBC 15.9 (*) 4.0 - 10.5 K/uL    RBC 4.39  3.87 - 5.11 MIL/uL    Hemoglobin 12.8  12.0 - 15.0 g/dL    HCT 91.4  78.2 - 95.6 %    MCV 85.9  78.0 - 100.0 fL    MCH 29.2  26.0 - 34.0 pg    MCHC 34.0  30.0 - 36.0 g/dL    RDW 21.3  08.6 - 57.8 %    Platelets 246  150 - 400 K/uL     Radiology/Results: No results found.  Anti-infectives: Anti-infectives     Start     Dose/Rate Route Frequency Ordered Stop   04/20/12 0700   ertapenem (INVANZ) 1 g in sodium chloride 0.9 % 50 mL IVPB        1 g 100 mL/hr over 30 Minutes Intravenous Every 24 hours 04/19/12 1122 04/20/12 0655   04/19/12 0623   ertapenem (INVANZ) 1 g in sodium chloride 0.9 % 50 mL IVPB        1 g 100 mL/hr over 30 Minutes Intravenous 60 min pre-op 04/19/12 4696 04/19/12 0756          Assessment/Plan: Problem List: Patient Active Problem List  Diagnosis  . Perforated bowel  . Peritonitis  . SIRS (systemic inflammatory response syndrome)  . COPD (chronic  obstructive pulmonary disease)  . Hypertension  . Chronic back pain  . Tobacco use  . Fever postop  . Migraine  . Normocytic anemia  . Fever of unknown origin  . S/P colostomy    Difficulty with pain control.  Otherwise stable after colostomy takedown. 1 Day Post-Op    LOS: 1 day   Matt B. Daphine Deutscher, MD, [facility] [phone] beeper [phone]  04/20/2012 7:02 AM

## 2012-04-21 MED ORDER — AMLODIPINE BESYLATE 10 MG PO TABS
10.0000 mg | ORAL_TABLET | Freq: Every day | ORAL | Status: DC
  Administered 2012-04-22 – 2012-04-24 (×3): 10 mg via ORAL
  Filled 2012-04-21 (×4): qty 1

## 2012-04-21 MED ORDER — IRBESARTAN 75 MG PO TABS
40.0000 mg | ORAL_TABLET | Freq: Every day | ORAL | Status: DC
  Administered 2012-04-22 – 2012-04-24 (×3): 37.5 mg via ORAL
  Filled 2012-04-21 (×4): qty 0.5

## 2012-04-21 MED ORDER — HYDROCHLOROTHIAZIDE 12.5 MG PO CAPS
25.0000 mg | ORAL_CAPSULE | Freq: Every day | ORAL | Status: DC
  Filled 2012-04-21: qty 2

## 2012-04-21 NOTE — Progress Notes (Signed)
2 Days Post-Op  Subjective: She looks good today.  Still no flatus or BM.  Pain better controlled with PCA  Objective: Vital signs in last 24 hours: Temp:  [98.4 F (36.9 C)-98.8 F (37.1 C)] 98.4 F (36.9 C) (10/20 0500) Pulse Rate:  [87-101] 101  (10/20 1004) Resp:  [11-18] 16  (10/20 0837) BP: (125-146)/(75-83) 143/83 mmHg (10/20 1004) SpO2:  [95 %-99 %] 98 % (10/20 0837) Last BM Date: 04/18/12  Intake/Output from previous day: 10/19 0701 - 10/20 0700 In: 3240 [P.O.:240; I.V.:3000] Out: 4250 [Urine:4250] Intake/Output this shift:    General appearance: alert, cooperative, no distress and sitting in chair Resp: nonlabored Cardio: normal rate, regular GI: soft, appropriate incisional tenderness, ND, wound without infection, wicks in place  Lab Results:   Basename 04/20/12 0412  WBC 15.9*  HGB 12.8  HCT 37.7  PLT 246   BMET  Basename 04/20/12 0412  NA 137  K 3.6  CL 100  CO2 28  GLUCOSE 126*  BUN 5*  CREATININE 0.63  CALCIUM 8.9   PT/INR No results found for this basename: LABPROT:2,INR:2 in the last 72 hours ABG No results found for this basename: PHART:2,PCO2:2,PO2:2,HCO3:2 in the last 72 hours  Studies/Results: No results found.  Anti-infectives: Anti-infectives     Start     Dose/Rate Route Frequency Ordered Stop   04/20/12 0700   ertapenem (INVANZ) 1 g in sodium chloride 0.9 % 50 mL IVPB        1 g 100 mL/hr over 30 Minutes Intravenous Every 24 hours 04/19/12 1122 04/20/12 0655   04/19/12 0623   ertapenem (INVANZ) 1 g in sodium chloride 0.9 % 50 mL IVPB        1 g 100 mL/hr over 30 Minutes Intravenous 60 min pre-op 04/19/12 0623 04/19/12 0756          Assessment/Plan: s/p Procedure(s) (LRB) with comments: EXPLORATORY LAPAROTOMY (N/A) COLOSTOMY CLOSURE (N/A) - Laparotomy, Resection and Closure of Colostomy HERNIA REPAIR VENTRAL ADULT (N/A) awaiting return of bowel function prior to advancing diet. She looks okay.  Mobilize as  tolerated.   LOS: 2 days    Lodema Pilot DAVID 04/21/2012

## 2012-04-22 ENCOUNTER — Encounter (HOSPITAL_COMMUNITY): Payer: Self-pay | Admitting: General Surgery

## 2012-04-22 LAB — CBC WITH DIFFERENTIAL/PLATELET
Basophils Absolute: 0 10*3/uL (ref 0.0–0.1)
Basophils Relative: 0 % (ref 0–1)
Eosinophils Absolute: 0.2 10*3/uL (ref 0.0–0.7)
Eosinophils Relative: 3 % (ref 0–5)
HCT: 28.6 % — ABNORMAL LOW (ref 36.0–46.0)
Hemoglobin: 9.7 g/dL — ABNORMAL LOW (ref 12.0–15.0)
Lymphocytes Relative: 20 % (ref 12–46)
Lymphs Abs: 1.4 10*3/uL (ref 0.7–4.0)
MCH: 29.8 pg (ref 26.0–34.0)
MCHC: 33.9 g/dL (ref 30.0–36.0)
MCV: 88 fL (ref 78.0–100.0)
Monocytes Absolute: 0.6 10*3/uL (ref 0.1–1.0)
Monocytes Relative: 9 % (ref 3–12)
Neutro Abs: 5 10*3/uL (ref 1.7–7.7)
Neutrophils Relative %: 68 % (ref 43–77)
Platelets: 170 10*3/uL (ref 150–400)
RBC: 3.25 MIL/uL — ABNORMAL LOW (ref 3.87–5.11)
RDW: 13 % (ref 11.5–15.5)
WBC: 7.2 10*3/uL (ref 4.0–10.5)

## 2012-04-22 LAB — BASIC METABOLIC PANEL
BUN: 8 mg/dL (ref 6–23)
CO2: 25 mEq/L (ref 19–32)
Calcium: 8.4 mg/dL (ref 8.4–10.5)
Chloride: 99 mEq/L (ref 96–112)
Creatinine, Ser: 0.49 mg/dL — ABNORMAL LOW (ref 0.50–1.10)
GFR calc Af Amer: >90 mL/min (ref >90)
GFR calc non Af Amer: >90 mL/min (ref >90)
Glucose, Bld: 71 mg/dL (ref 70–99)
Potassium: 3.6 mEq/L (ref 3.5–5.1)
Sodium: 136 mEq/L (ref 135–145)

## 2012-04-22 MED ORDER — HYDROCHLOROTHIAZIDE 25 MG PO TABS
25.0000 mg | ORAL_TABLET | Freq: Every day | ORAL | Status: DC
  Administered 2012-04-22 – 2012-04-24 (×3): 25 mg via ORAL
  Filled 2012-04-22 (×4): qty 1

## 2012-04-22 MED ORDER — BISACODYL 10 MG RE SUPP
10.0000 mg | Freq: Two times a day (BID) | RECTAL | Status: AC
  Administered 2012-04-22 (×2): 10 mg via RECTAL
  Filled 2012-04-22 (×2): qty 1

## 2012-04-22 NOTE — Progress Notes (Addendum)
3 Days Post-Op  Subjective: Feeling a little better. Still has pain but PCA working well. Walking the hall last night. No flatus or stool but hears bowel sounds. No nausea.  Lab work looks good with WBC down to  7200 with a normal differential. B-met normal  Objective: Vital signs in last 24 hours: Temp:  [98.3 F (36.8 C)-98.6 F (37 C)] 98.3 F (36.8 C) (10/20 2110) Pulse Rate:  [83-101] 94  (10/20 2110) Resp:  [12-18] 14  (10/21 0354) BP: (100-143)/(64-83) 100/64 mmHg (10/20 2110) SpO2:  [98 %-100 %] 98 % (10/21 0354) Last BM Date: 04/18/12  Intake/Output from previous day: 10/20 0701 - 10/21 0700 In: 2500 [I.V.:2500] Out: 1700 [Urine:1700] Intake/Output this shift: Total I/O In: 1000 [I.V.:1000] Out: 1000 [Urine:1000]  General appearance: alert. No distress. Mental status normal. Skin warm and dry Resp: clear to auscultation bilaterally GI: abdomen soft, borderline distended, hypoactive bowel sounds, wound clean, weeks in place.  Lab Results:  Results for orders placed during the hospital encounter of 04/19/12 (from the past 24 hour(s))  CBC WITH DIFFERENTIAL     Status: Abnormal   Collection Time   04/22/12  3:50 AM      Component Value Range   WBC 7.2  4.0 - 10.5 K/uL   RBC 3.25 (*) 3.87 - 5.11 MIL/uL   Hemoglobin 9.7 (*) 12.0 - 15.0 g/dL   HCT 16.1 (*) 09.6 - 04.5 %   MCV 88.0  78.0 - 100.0 fL   MCH 29.8  26.0 - 34.0 pg   MCHC 33.9  30.0 - 36.0 g/dL   RDW 40.9  81.1 - 91.4 %   Platelets 170  150 - 400 K/uL   Neutrophils Relative 68  43 - 77 %   Lymphocytes Relative 20  12 - 46 %   Monocytes Relative 9  3 - 12 %   Eosinophils Relative 3  0 - 5 %   Basophils Relative 0  0 - 1 %   Neutro Abs 5.0  1.7 - 7.7 K/uL   Lymphs Abs 1.4  0.7 - 4.0 K/uL   Monocytes Absolute 0.6  0.1 - 1.0 K/uL   Eosinophils Absolute 0.2  0.0 - 0.7 K/uL   Basophils Absolute 0.0  0.0 - 0.1 K/uL  BASIC METABOLIC PANEL     Status: Abnormal   Collection Time   04/22/12  3:50 AM   Component Value Range   Sodium 136  135 - 145 mEq/L   Potassium 3.6  3.5 - 5.1 mEq/L   Chloride 99  96 - 112 mEq/L   CO2 25  19 - 32 mEq/L   Glucose, Bld 71  70 - 99 mg/dL   BUN 8  6 - 23 mg/dL   Creatinine, Ser 7.82 (*) 0.50 - 1.10 mg/dL   Calcium 8.4  8.4 - 95.6 mg/dL   GFR calc non Af Amer >90  >90 mL/min   GFR calc Af Amer >90  >90 mL/min     Studies/Results: @RISRSLT24 @     . alvimopan  12 mg Oral BID  . amLODipine  10 mg Oral Daily  . bisacodyl  10 mg Rectal BID  . heparin  5,000 Units Subcutaneous Q8H  . hydrochlorothiazide  25 mg Oral Daily  . irbesartan  37.5 mg Oral Daily  . levothyroxine  75 mcg Oral Q breakfast  . morphine   Intravenous Q4H  . multivitamin with minerals  1 tablet Oral Daily  . vitamin C  500  mg Oral Daily  . DISCONTD: hydrochlorothiazide  12.5 mg Oral Daily  . DISCONTD: hydrochlorothiazide  25 mg Oral Daily  . DISCONTD: irbesartan  300 mg Oral Daily     Assessment/Plan: s/p Procedure(s): EXPLORATORY LAPAROTOMY COLOSTOMY CLOSURE HERNIA REPAIR VENTRAL ADULT  POD #3, resection and closure of colostomy, ventral hernia repair, debridement of skin. Doing well. Await resolution of ileus Allow clear liquids Dulcolax suppository Continue entereg Check pathology  Hypertension. Good control. COPD. Continue pulmonary toilet History of tobacco use.    LOS: 3 days    Kanon Colunga M. Derrell Lolling, M.D., [facility] General and Minimally invasive Surgery Breast and Colorectal Surgery Office:   [phone] Pager:   [phone]  04/22/2012  . .prob

## 2012-04-22 NOTE — Care Management Note (Signed)
    Page 1 of 1   04/22/2012     10:36:50 AM   CARE MANAGEMENT NOTE 04/22/2012  Patient:  Stacy Dalton, Stacy Dalton   Account Number:  [id number]  Date Initiated:  04/22/2012  Documentation initiated by:  Lorenda Ishihara  Subjective/Objective Assessment:   61 yo female admitted s/p colostomy closure and ventral hernia. PTA lived at home with spouse.     Action/Plan:   Anticipated DC Date:  04/24/2012   Anticipated DC Plan:  HOME W HOME HEALTH SERVICES      DC Planning Services  CM consult      [facility] Choice  HOME HEALTH   Choice offered to / List presented to:  C-1 Patient        HH arranged  HH-1 RN      [facility] agency  [facility]   Status of service:  Completed, signed off Medicare Important Message given?   (If response is "NO", the following Medicare IM given date fields will be blank) Date Medicare IM given:   Date Additional Medicare IM given:    Discharge Disposition:  HOME W HOME HEALTH SERVICES  Per UR Regulation:  Reviewed for med. necessity/level of care/duration of stay  If discussed at Long Length of Stay Meetings, dates discussed:    Comments:  04-22-12 Lorenda Ishihara RN CM 1000 Spoke with patient at bedside. Discussed d/c plans, ambulating well in halls, feels might need HH RN to assist with dressing changes. Patient states she has used [facility] in the past for [facility] services and would like to use them again. Contacted Mary with [facility] she will follow up with patient for d/c needs. Awaiting final wound care orders.

## 2012-04-23 MED ORDER — BUPRENORPHINE HCL 0.3 MG/ML IJ SOLN
0.3000 mg | INTRAMUSCULAR | Status: DC | PRN
  Administered 2012-04-23 (×4): 0.3 mg via INTRAVENOUS
  Filled 2012-04-23 (×4): qty 1

## 2012-04-23 NOTE — Progress Notes (Signed)
4 Days Post-Op  Subjective: Doing better. Using less pain medicine. Tolerating clear liquids. Passing flatus. Had a small bowel movement. Generally feels better.  Pathology report shows benign colonic segments. This was communicated to the patient.  Objective: Vital signs in last 24 hours: Temp:  [98.2 F (36.8 C)-99 F (37.2 C)] 98.2 F (36.8 C) (10/21 2120) Pulse Rate:  [82-88] 82  (10/21 2120) Resp:  [18] 18  (10/22 0400) BP: (115-117)/(72-74) 115/73 mmHg (10/21 2120) SpO2:  [98 %-100 %] 100 % (10/22 0400) Last BM Date: 04/18/12  Intake/Output from previous day: 10/21 0701 - 10/22 0700 In: 2800 [P.O.:600; I.V.:2200] Out: 4200 [Urine:4200] Intake/Output this shift: Total I/O In: 1000 [I.V.:1000] Out: 1000 [Urine:1000]  General appearance: alert. No distress. Comfortable. Mental status normal. GI: abdomen generally soft. Hypoactive bowel sounds. Not really distended but mild diffuse tenderness noted. Wound looks clean. Wicks in place.  Lab Results:  No results found for this or any previous visit (from the past 24 hour(s)).   Studies/Results: @RISRSLT24 @     . alvimopan  12 mg Oral BID  . amLODipine  10 mg Oral Daily  . bisacodyl  10 mg Rectal BID  . heparin  5,000 Units Subcutaneous Q8H  . hydrochlorothiazide  25 mg Oral Daily  . irbesartan  37.5 mg Oral Daily  . levothyroxine  75 mcg Oral Q breakfast  . morphine   Intravenous Q4H  . multivitamin with minerals  1 tablet Oral Daily  . vitamin C  500 mg Oral Daily     Assessment/Plan: s/p Procedure(s): EXPLORATORY LAPAROTOMY COLOSTOMY CLOSURE HERNIA REPAIR VENTRAL ADULT  POD #4, resection and closure of colostomy, ventral hernia repair, debridement of skin.  Doing well. Ileus beginning to resolve  Allow full liquids  Decrease IV rate and decrease narcotic  Continue entereg    Hypertension. Good control.  COPD. Continue pulmonary toilet  History of tobacco use      LOS: 4 days    Stacy Dalton  M 04/23/2012  . .prob

## 2012-04-24 LAB — BASIC METABOLIC PANEL
BUN: 4 mg/dL — ABNORMAL LOW (ref 6–23)
CO2: 34 mEq/L — ABNORMAL HIGH (ref 19–32)
Calcium: 9.2 mg/dL (ref 8.4–10.5)
Chloride: 101 mEq/L (ref 96–112)
Creatinine, Ser: 0.52 mg/dL (ref 0.50–1.10)
GFR calc Af Amer: >90 mL/min (ref >90)
GFR calc non Af Amer: >90 mL/min (ref >90)
Glucose, Bld: 94 mg/dL (ref 70–99)
Potassium: 3.4 mEq/L — ABNORMAL LOW (ref 3.5–5.1)
Sodium: 141 mEq/L (ref 135–145)

## 2012-04-24 LAB — CBC
HCT: 33.2 % — ABNORMAL LOW (ref 36.0–46.0)
Hemoglobin: 11 g/dL — ABNORMAL LOW (ref 12.0–15.0)
MCH: 29 pg (ref 26.0–34.0)
MCHC: 33.1 g/dL (ref 30.0–36.0)
MCV: 87.6 fL (ref 78.0–100.0)
Platelets: 291 10*3/uL (ref 150–400)
RBC: 3.79 MIL/uL — ABNORMAL LOW (ref 3.87–5.11)
RDW: 12.9 % (ref 11.5–15.5)
WBC: 4.4 10*3/uL (ref 4.0–10.5)

## 2012-04-24 MED ORDER — KETOROLAC TROMETHAMINE 10 MG PO TABS
10.0000 mg | ORAL_TABLET | Freq: Four times a day (QID) | ORAL | Status: DC | PRN
Start: 2012-04-24 — End: 2012-04-24
  Administered 2012-04-24: 10 mg via ORAL
  Filled 2012-04-24: qty 1

## 2012-04-24 MED ORDER — POTASSIUM CHLORIDE CRYS ER 20 MEQ PO TBCR
30.0000 meq | EXTENDED_RELEASE_TABLET | Freq: Three times a day (TID) | ORAL | Status: AC
  Administered 2012-04-24 (×3): 30 meq via ORAL
  Filled 2012-04-24 (×3): qty 1

## 2012-04-24 MED ORDER — HYDROCODONE-ACETAMINOPHEN 5-325 MG PO TABS
1.0000 | ORAL_TABLET | ORAL | Status: DC | PRN
  Administered 2012-04-24 – 2012-04-25 (×5): 2 via ORAL
  Filled 2012-04-24 (×5): qty 2

## 2012-04-24 NOTE — Progress Notes (Signed)
Verbal order received for Stacy Dalton to restart patient on vicodin and to give with food, orders entered Means, Myrtie Hawk RN 04-24-2012 11:38am

## 2012-04-24 NOTE — Progress Notes (Signed)
5 Days Post-Op  Subjective: Feeling better. Having some small loose stools and flatus. Tolerating full liquids. Ambulating in halls.  Vicodin makes her nauseated but no vomiting. Says she is hungry would like to advance diet.  Objective: Vital signs in last 24 hours: Temp:  [98.3 F (36.8 C)-98.7 F (37.1 C)] 98.3 F (36.8 C) (10/22 2200) Pulse Rate:  [76-98] 76  (10/22 2200) Resp:  [16-18] 16  (10/22 2200) BP: (100-120)/(65-79) 100/65 mmHg (10/22 2200) SpO2:  [95 %-98 %] 98 % (10/22 2200) Last BM Date: 04/23/12  Intake/Output from previous day: 10/22 0701 - 10/23 0700 In: 1292.5 [P.O.:720; I.V.:572.5] Out: 2400 [Urine:2400] Intake/Output this shift: Total I/O In: -  Out: 500 [Urine:500]  General appearance: alert. Friendly. No distress. Mental status normal. Resp: lungs are clear to auscultation bilaterally. Vital capacity 1800 cc. GI: abdomen softer. Wounds looked okay. I removed the wicks from the wounds today. Incision otherwise intact.  Lab Results:  No results found for this or any previous visit (from the past 24 hour(s)).   Studies/Results: @RISRSLT24 @     . alvimopan  12 mg Oral BID  . amLODipine  10 mg Oral Daily  . heparin  5,000 Units Subcutaneous Q8H  . hydrochlorothiazide  25 mg Oral Daily  . irbesartan  37.5 mg Oral Daily  . levothyroxine  75 mcg Oral Q breakfast  . multivitamin with minerals  1 tablet Oral Daily  . vitamin C  500 mg Oral Daily  . DISCONTD: morphine   Intravenous Q4H     Assessment/Plan: s/p Procedure(s): EXPLORATORY LAPAROTOMY COLOSTOMY CLOSURE HERNIA REPAIR VENTRAL ADULT  POD #5, resection and closure of colostomy, ventral hernia repair, debridement of skin.  Doing well. Ileus resolving Advanced to low-residue diet plus prune-juice Current IV rate TKA  Type II used to port all instead of Vicodin  Continue entereg  Probable discharge home tomorrow  Hypertension. Good control.  COPD. Continue pulmonary toilet  History  of tobacco use, quit 10 years ago      LOS: 5 days    Bonny Egger M 04/24/2012  . .prob

## 2012-04-25 MED ORDER — HYDROCODONE-ACETAMINOPHEN 5-500 MG PO TABS: 1.0000 | ORAL_TABLET | ORAL | Status: DC | PRN

## 2012-04-25 NOTE — Discharge Summary (Signed)
Patient ID: [name] [id number] 61 y.o. [birth year]  04/19/2012  Discharge date and time: No discharge date for patient encounter.  Admitting Physician: Ernestene Mention  Discharge Physician: Ernestene Mention  Admission Diagnoses: colostomy  Discharge Diagnoses: sigmoid colostomy, status post perforation  Operations: Procedure(s): EXPLORATORY LAPAROTOMY COLOSTOMY CLOSURE HERNIA REPAIR VENTRAL ADULT  Admission Condition: good  Discharged Condition: good  Indication for Admission: Stacy Dalton is a 61 y.o. female. This patient presents for elective colostomy reversal. On 10/04/2011 she presented with a stercoral perforation of the distal descending colon. She had diffuse peritonitis and multiple stool balls throughout the abdomen. She was septic. She underwent resection of this segment of diseased bowel, proximal diversion and stapling off of the distal segment. I was surprised that she did not develop an abdominal abscess and did not have to have any further drainage procedures.  Her health is back to baseline now. She walks every day. She eats well. She is no longer constipated. She has been treated for enterococcus urinary tract infection by Dr. Modesto Charon.  Dr. Dulce Sellar performed a colonoscopy per colostomy and per rectum on August 28 and it looked good. A few diverticula were noted in the sigmoid. Considering his findings as well as my operative note I think we probably have a reasonable distal segment.  She is brought to the hospital electively for elective resection and closure of colostomy following a bowel prep at home   Hospital Course: on the day of admission the patient underwent abdominal exploration through a lower midline incision. We took down the colostomy and resected about 3 inches of the colostomy. We identified the distal segment and resected about 3 inches of that and performed a stapled anastomosis. The surgery was uneventful. She had an ileus for a few  days but that eventually resolved. She was on entereg  protocol. She advanced her diet and activities uneventfully and was doing well on the day of discharge. The day of discharge her wound looked good. The wicks had been removed from the wounds. Staples were left in place. There was no sign of infection. There were intentional gaps in the wound requiring dressing because of mild drainage. She was ambulatory, tolerating a diet, having bowel movements, and pain was under good control. Home health nursing arrangements were made. Discharge instructions were given. She is to see me in the office in one week for staple removal.  Consults: None  Significant Diagnostic Studies: pathology  Treatments: surgery: resection and closure of colostomy with lysis of adhesions  Disposition: Home  Patient Instructions:   Stacy Dalton, Stacy Dalton  Home Medication Instructions [id number]   Printed on:04/25/12 0509  Medication Information                    Multiple Vitamin (MULITIVITAMIN WITH MINERALS) TABS Take 1 tablet by mouth daily.           acetaminophen (TYLENOL) 325 MG tablet Take 2 tablets (650 mg total) by mouth every 4 (four) hours as needed for fever.           promethazine (PHENERGAN) 25 MG tablet 1/2 tablet to 1 tablet every 8 hours as needed for nausea with pain medicine.           Olmesartan-Amlodipine-HCTZ (TRIBENZOR) 40-10-12.5 MG TABS Take 1 tablet by mouth every morning.            zolpidem (AMBIEN CR) 12.5 MG CR tablet Take 12.5 mg by mouth at bedtime as needed. For sleep  levothyroxine (SYNTHROID, LEVOTHROID) 75 MCG tablet Take 75 mcg by mouth every morning.            HYDROcodone-acetaminophen (NORCO/VICODIN) 5-325 MG per tablet Take 1 tablet by mouth every 6 (six) hours as needed. For pain           Aspirin-Salicylamide-Caffeine (BC HEADACHE POWDER PO) Take 1 packet by mouth every 6 (six) hours as needed. For aches and pains           vitamin C (ASCORBIC ACID) 500 MG  tablet Take 500 mg by mouth daily.           Borage, Borago officinalis, (BORAGE 1000) 1000 MG CAPS Take 2,000 mg by mouth daily.           Biotin 5000 MCG CAPS Take 5,000 mcg by mouth daily.           ALPHA LIPOIC ACID PO Take 200 mg by mouth daily.           Coenzyme Q10 (CO Q 10) 100 MG CAPS Take 100 mg by mouth daily.           Cholecalciferol (VITAMIN D) 2000 UNITS tablet Take 2,000 Units by mouth daily.           docusate sodium (COLACE) 100 MG capsule Take 100 mg by mouth at bedtime.           Magnesium Oxide 500 MG (LAX) TABS Take 500-2,000 mg by mouth at bedtime.           HYDROcodone-acetaminophen (VICODIN) 5-500 MG per tablet Take 1 tablet by mouth every 4 (four) hours as needed for pain.             Activity: ambulate frequently. No sports or heavy lifting for 4 weeks. Okay to shower. Do not drive a car for another 1-61 days. Diet: lots of fluids. Lots of fluids and vegetables. High fiber low fat diet. Wound Care: okay to shower. Covered wound was dry 4 x 4's daily.  Follow-up:  With Dr. Derrell Lolling in 1 week.  Signed: Angelia Mould. Derrell Lolling, M.D., FACS General and minimally invasive surgery Breast and Colorectal Surgery  04/25/2012, 5:09 AM

## 2012-04-25 NOTE — Progress Notes (Signed)
6 Days Post-Op  Subjective: Feels better once to go home. Had 2 stools yesterday. No nausea. Pain under control.  Need home health nursing because of unhealed surgical wound with open draining areas. No infection.  Objective: Vital signs in last 24 hours: Temp:  [97.9 F (36.6 C)-98.1 F (36.7 C)] 97.9 F (36.6 C) (10/23 2200) Pulse Rate:  [76-84] 76  (10/23 2200) Resp:  [16-18] 16  (10/23 2200) BP: (96-108)/(60-70) 101/65 mmHg (10/23 2200) SpO2:  [97 %-98 %] 97 % (10/23 2200) Last BM Date: 04/24/12  Intake/Output from previous day: 10/23 0701 - 10/24 0700 In: 1547.5 [P.O.:720; I.V.:827.5] Out: 4125 [Urine:4125] Intake/Output this shift: Total I/O In: 227.5 [I.V.:227.5] Out: 1775 [Urine:1775]  General appearance: alert. Mental status normal. Pleasant. In good spirits. GI: abdomen soft. Minimally tender. Not distended. Active bowel sounds. Staples and wounds. Intentional gaps between staples. No infection.  Lab Results:  No results found for this or any previous visit (from the past 24 hour(s)).   Studies/Results: @RISRSLT24 @     . alvimopan  12 mg Oral BID  . amLODipine  10 mg Oral Daily  . heparin  5,000 Units Subcutaneous Q8H  . hydrochlorothiazide  25 mg Oral Daily  . irbesartan  37.5 mg Oral Daily  . levothyroxine  75 mcg Oral Q breakfast  . multivitamin with minerals  1 tablet Oral Daily  . potassium chloride  30 mEq Oral TID  . vitamin C  500 mg Oral Daily     Assessment/Plan: s/p Procedure(s): EXPLORATORY LAPAROTOMY COLOSTOMY CLOSURE HERNIA REPAIR VENTRAL ADULT   POD #6, resection and closure of colostomy, ventral hernia repair, debridement of skin.  Doing well. Discharge home today. Home health nursing arranged for unhealed surgical wound. Diet and activities discussed Return to office next week for staple removal    Hypertension. Good control.  COPD. Continue pulmonary toilet  History of tobacco use, quit 10 years ago     LOS: 6 days     Javier Mamone M 04/25/2012  . .prob

## 2012-04-25 NOTE — Progress Notes (Signed)
Assessment unchanged. Pt verbalized understanding of dc instructions. Scripts x1 given as provided by MD. [facility] to follow pt at home. Pt discharged via wheelchair to front entrance to meet awaiting vehicle to carry home. Accompanied by NT and husband.

## 2012-04-26 ENCOUNTER — Telehealth (INDEPENDENT_AMBULATORY_CARE_PROVIDER_SITE_OTHER): Payer: Self-pay

## 2012-04-26 NOTE — Telephone Encounter (Signed)
Lisa at [employer] called stating pt has [employer] and has to have a homebound status to continue HHN. She accessed pt and pt is independent with health and daily care. Per Misty Stanley no open wound and pt demonstrates ability to communicate any change in wound or medical status to Md office. [facility] will d/c home visits at this time.

## 2012-04-30 ENCOUNTER — Ambulatory Visit (INDEPENDENT_AMBULATORY_CARE_PROVIDER_SITE_OTHER): Admitting: General Surgery

## 2012-04-30 ENCOUNTER — Encounter (INDEPENDENT_AMBULATORY_CARE_PROVIDER_SITE_OTHER): Payer: Self-pay | Admitting: General Surgery

## 2012-04-30 VITALS — BP 134/62 | HR 72 | Temp 98.1°F | Resp 16 | Ht 61.5 in | Wt 128.4 lb

## 2012-04-30 DIAGNOSIS — Z933 Colostomy status: Secondary | ICD-10-CM

## 2012-04-30 NOTE — Progress Notes (Signed)
Patient ID: Stacy Dalton, female   DOB: 1951/06/01, 61 y.o.   MRN: [id number] History: This patient recently underwent elective resection and closure of her colostomy. She had a stapled anastomosis well above the peritoneal reflection. She is recovering uneventfully. She has resumed normal diet. She's having 3 or 4 stools per day but they're becoming more formed. She is taking daily stool softeners.  Exam: Patient looks well. No distress. Temp 98.1. Heart rate 73. In no distress. Husband is with her Abdomen soft and nontender. Midline incision and left-sided colostomy wounds looked pretty good. I removed the staples. At the upper end of the midline incision there was a small 3 cm area of erythema. I separated the skin edges and there was a little bit of cloudy serosanguineous fluid which was evacuated but no abscess. No odor. The wounds were redressed.  Assessment: Recent elective resection and closure of colostomy. Recovering without major complications. Question low-grade cellulitis of upper wound, skin edges open today. Anticipate spontaneous resolution  Plan: Diet and activities discussed. No sports or heavy lifting for another month. She will call if the red area at the upper incision does not resolve within 5 or 6 days Otherwise, return to see me in one month.   Angelia Mould. Derrell Lolling, M.D., [facility] General and Minimally invasive Surgery Breast and Colorectal Surgery Office:   [phone] Pager:   [phone]

## 2012-04-30 NOTE — Patient Instructions (Signed)
You seem to be recovering from your surgery to close the  colostomy without any major complications. The staples were removed today.  Keep an eye on the small red area at the upper part of the incision. I expect that to go away over the next 4-6 days. If it does not go away or enlarges call me and we'll bring you back in for further examination.  No sports or lifting yet but takes daily walks around the block.  Continue to drink lots of water and takes stool softeners daily  Return to see Dr. Derrell Lolling in one month

## 2012-06-03 ENCOUNTER — Encounter (INDEPENDENT_AMBULATORY_CARE_PROVIDER_SITE_OTHER): Payer: Self-pay | Admitting: General Surgery

## 2012-06-03 ENCOUNTER — Ambulatory Visit (INDEPENDENT_AMBULATORY_CARE_PROVIDER_SITE_OTHER): Admitting: General Surgery

## 2012-06-03 VITALS — BP 110/82 | HR 82 | Temp 98.6°F | Resp 12 | Ht 62.0 in | Wt 136.0 lb

## 2012-06-03 DIAGNOSIS — Z933 Colostomy status: Secondary | ICD-10-CM

## 2012-06-03 NOTE — Patient Instructions (Signed)
To have recovered from your surgery to closure the colostomy without any obvious surgical complications.  You may resume normal activities without restriction.  Be sure to eat 7 or 8 servings of fruits and vegetables per day, drink lots of water, and avoid fatty foods  Return to see Dr. Derrell Lolling if further problems arise.

## 2012-06-03 NOTE — Progress Notes (Signed)
Patient ID: Stacy Dalton, female   DOB: [birth year], 61 y.o.   MRN: [id number] This patient underwent laparotomy, resection and closure of colostomy on 04/19/2012. She has a stapled anastomosis. She has had no problems. She feels well. Appetite normal. Bowel function normal. No complaints   On exam she looks well. Her husband is with her. Abdomen is soft and nontender. The midline incision and the colostomy site were all well healed without signs of infection, drainage, or hernia  Plan  okay to resume normal physical activities without restriction. She was advised not to overdo it High-fiber, low-fat diet with hydration stressed Return to see me when necessary   [facility]. Derrell Lolling, M.D., [facility] General and Minimally invasive Surgery Breast and Colorectal Surgery Office:   [phone] Pager:   [phone]

## 2012-06-17 ENCOUNTER — Encounter (INDEPENDENT_AMBULATORY_CARE_PROVIDER_SITE_OTHER): Admitting: General Surgery

## 2012-09-01 ENCOUNTER — Encounter: Payer: Self-pay | Admitting: Family Medicine

## 2012-09-01 DIAGNOSIS — F4323 Adjustment disorder with mixed anxiety and depressed mood: Secondary | ICD-10-CM

## 2012-09-01 DIAGNOSIS — E785 Hyperlipidemia, unspecified: Secondary | ICD-10-CM | POA: Insufficient documentation

## 2012-09-01 DIAGNOSIS — F411 Generalized anxiety disorder: Secondary | ICD-10-CM | POA: Insufficient documentation

## 2012-09-23 ENCOUNTER — Encounter: Payer: Self-pay | Admitting: Family Medicine

## 2012-09-23 ENCOUNTER — Ambulatory Visit (INDEPENDENT_AMBULATORY_CARE_PROVIDER_SITE_OTHER): Admitting: Family Medicine

## 2012-09-23 VITALS — BP 148/83 | HR 75 | Temp 97.3°F

## 2012-09-23 DIAGNOSIS — G8929 Other chronic pain: Secondary | ICD-10-CM

## 2012-09-23 DIAGNOSIS — M549 Dorsalgia, unspecified: Secondary | ICD-10-CM

## 2012-09-23 DIAGNOSIS — Z933 Colostomy status: Secondary | ICD-10-CM

## 2012-09-23 DIAGNOSIS — D649 Anemia, unspecified: Secondary | ICD-10-CM

## 2012-09-23 DIAGNOSIS — H9192 Unspecified hearing loss, left ear: Secondary | ICD-10-CM

## 2012-09-23 DIAGNOSIS — E785 Hyperlipidemia, unspecified: Secondary | ICD-10-CM

## 2012-09-23 DIAGNOSIS — G43909 Migraine, unspecified, not intractable, without status migrainosus: Secondary | ICD-10-CM

## 2012-09-23 DIAGNOSIS — I10 Essential (primary) hypertension: Secondary | ICD-10-CM

## 2012-09-23 DIAGNOSIS — F4323 Adjustment disorder with mixed anxiety and depressed mood: Secondary | ICD-10-CM

## 2012-09-23 DIAGNOSIS — R112 Nausea with vomiting, unspecified: Secondary | ICD-10-CM

## 2012-09-23 DIAGNOSIS — H918X9 Other specified hearing loss, unspecified ear: Secondary | ICD-10-CM

## 2012-09-23 DIAGNOSIS — N390 Urinary tract infection, site not specified: Secondary | ICD-10-CM

## 2012-09-23 LAB — POCT URINALYSIS DIPSTICK
Bilirubin, UA: NEGATIVE
Glucose, UA: NEGATIVE
Ketones, UA: NEGATIVE
Nitrite, UA: NEGATIVE
Protein, UA: NEGATIVE
Spec Grav, UA: 1.01
Urobilinogen, UA: NEGATIVE
pH, UA: 7.5

## 2012-09-23 MED ORDER — PROMETHAZINE HCL 25 MG PO TABS: ORAL_TABLET | ORAL | Status: DC

## 2012-09-23 MED ORDER — CIPROFLOXACIN HCL 500 MG PO TABS: 500.0000 mg | ORAL_TABLET | Freq: Two times a day (BID) | ORAL | Status: DC
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
## 2012-09-23 NOTE — Patient Instructions (Signed)
Urinary Tract Infection  A urinary tract infection (UTI) is often caused by a germ (bacteria). A UTI is usually helped with medicine (antibiotics) that kills germs. Take all the medicine until it is gone. Do this even if you are feeling better. You are usually better in 7 to 10 days.  HOME CARE    Drink enough water and fluids to keep your pee (urine) clear or pale yellow. Drink:   Cranberry juice.   Water.   Avoid:   Caffeine.   Tea.   Bubbly (carbonated) drinks.   Alcohol.   Only take medicine as told by your doctor.   To prevent further infections:   Pee often.   After pooping (bowel movement), women should wipe from front to back. Use each tissue only once.   Pee before and after having sex (intercourse).  Ask your doctor when your test results will be ready. Make sure you follow up and get your test results.   GET HELP RIGHT AWAY IF:    There is very bad back pain or lower belly (abdominal) pain.   You get the chills.   You have a fever.   Your baby is older than 3 months with a rectal temperature of 102 F (38.9 C) or higher.   Your baby is 3 months old or younger with a rectal temperature of 100.4 F (38 C) or higher.   You feel sick to your stomach (nauseous) or throw up (vomit).   There is continued burning with peeing.   Your problems are not better in 3 days. Return sooner if you are getting worse.  MAKE SURE YOU:    Understand these instructions.   Will watch your condition.   Will get help right away if you are not doing well or get worse.  Document Released: 12/06/2007 Document Revised: 09/11/2011 Document Reviewed: 12/06/2007  [employer] Patient Information 2013 [employer].

## 2012-09-25 ENCOUNTER — Other Ambulatory Visit: Payer: Self-pay | Admitting: Family Medicine

## 2012-09-25 DIAGNOSIS — N390 Urinary tract infection, site not specified: Secondary | ICD-10-CM

## 2012-09-25 LAB — URINE CULTURE: Colony Count: >=100000

## 2012-09-25 MED ORDER — NITROFURANTOIN MONOHYD MACRO 100 MG PO CAPS: 100.0000 mg | ORAL_CAPSULE | Freq: Two times a day (BID) | ORAL | Status: DC

## 2012-09-27 ENCOUNTER — Telehealth: Payer: Self-pay | Admitting: Family Medicine

## 2012-09-27 ENCOUNTER — Other Ambulatory Visit: Payer: Self-pay | Admitting: Family Medicine

## 2012-09-27 ENCOUNTER — Telehealth: Payer: Self-pay

## 2012-09-27 MED ORDER — HYDROCODONE-ACETAMINOPHEN 5-325 MG PO TABS: 1.0000 | ORAL_TABLET | Freq: Four times a day (QID) | ORAL | Status: DC | PRN

## 2012-09-27 NOTE — Telephone Encounter (Signed)
Gina to call in prescription.

## 2012-09-27 NOTE — Telephone Encounter (Signed)
rx called in and pt aware.  States her husband is back in hospice

## 2012-09-27 NOTE — Telephone Encounter (Signed)
Pt notiifed and she  will call her pharmacy for her refill on pain med advised pt for "her safety to contac her pharmacy for the refll

## 2012-09-27 NOTE — Telephone Encounter (Signed)
Call to [facility]

## 2012-10-07 ENCOUNTER — Emergency Department (HOSPITAL_COMMUNITY)
Admission: EM | Admit: 2012-10-07 | Discharge: 2012-10-07 | Disposition: A | Discharge status: Home / Self Care | Attending: Emergency Medicine | Admitting: Emergency Medicine

## 2012-10-07 ENCOUNTER — Encounter (HOSPITAL_COMMUNITY): Payer: Self-pay | Admitting: *Deleted

## 2012-10-07 DIAGNOSIS — M62838 Other muscle spasm: Secondary | ICD-10-CM | POA: Insufficient documentation

## 2012-10-07 DIAGNOSIS — K219 Gastro-esophageal reflux disease without esophagitis: Secondary | ICD-10-CM | POA: Insufficient documentation

## 2012-10-07 DIAGNOSIS — J209 Acute bronchitis, unspecified: Secondary | ICD-10-CM | POA: Insufficient documentation

## 2012-10-07 DIAGNOSIS — R209 Unspecified disturbances of skin sensation: Secondary | ICD-10-CM | POA: Insufficient documentation

## 2012-10-07 DIAGNOSIS — G43909 Migraine, unspecified, not intractable, without status migrainosus: Secondary | ICD-10-CM | POA: Insufficient documentation

## 2012-10-07 DIAGNOSIS — E876 Hypokalemia: Secondary | ICD-10-CM | POA: Insufficient documentation

## 2012-10-07 DIAGNOSIS — I10 Essential (primary) hypertension: Secondary | ICD-10-CM | POA: Insufficient documentation

## 2012-10-07 DIAGNOSIS — E039 Hypothyroidism, unspecified: Secondary | ICD-10-CM | POA: Insufficient documentation

## 2012-10-07 DIAGNOSIS — F411 Generalized anxiety disorder: Secondary | ICD-10-CM | POA: Insufficient documentation

## 2012-10-07 DIAGNOSIS — J440 Chronic obstructive pulmonary disease with acute lower respiratory infection: Secondary | ICD-10-CM | POA: Insufficient documentation

## 2012-10-07 DIAGNOSIS — Z87891 Personal history of nicotine dependence: Secondary | ICD-10-CM | POA: Insufficient documentation

## 2012-10-07 DIAGNOSIS — G4700 Insomnia, unspecified: Secondary | ICD-10-CM | POA: Insufficient documentation

## 2012-10-07 DIAGNOSIS — F3289 Other specified depressive episodes: Secondary | ICD-10-CM | POA: Insufficient documentation

## 2012-10-07 DIAGNOSIS — R259 Unspecified abnormal involuntary movements: Secondary | ICD-10-CM | POA: Insufficient documentation

## 2012-10-07 DIAGNOSIS — F329 Major depressive disorder, single episode, unspecified: Secondary | ICD-10-CM | POA: Insufficient documentation

## 2012-10-07 DIAGNOSIS — Z79899 Other long term (current) drug therapy: Secondary | ICD-10-CM | POA: Insufficient documentation

## 2012-10-07 LAB — COMPREHENSIVE METABOLIC PANEL
ALT: 18 U/L (ref 0–35)
AST: 18 U/L (ref 0–37)
Albumin: 4.6 g/dL (ref 3.5–5.2)
Alkaline Phosphatase: 91 U/L (ref 39–117)
BUN: 7 mg/dL (ref 6–23)
CO2: 27 mEq/L (ref 19–32)
Calcium: 10.4 mg/dL (ref 8.4–10.5)
Chloride: 95 mEq/L — ABNORMAL LOW (ref 96–112)
Creatinine, Ser: 0.66 mg/dL (ref 0.50–1.10)
GFR calc Af Amer: >90 mL/min (ref >90)
GFR calc non Af Amer: >90 mL/min (ref >90)
Glucose, Bld: 104 mg/dL — ABNORMAL HIGH (ref 70–99)
Potassium: 2.8 mEq/L — ABNORMAL LOW (ref 3.5–5.1)
Sodium: 138 mEq/L (ref 135–145)
Total Bilirubin: 0.5 mg/dL (ref 0.3–1.2)
Total Protein: 8.1 g/dL (ref 6.0–8.3)

## 2012-10-07 LAB — CBC WITH DIFFERENTIAL/PLATELET
Basophils Absolute: 0 10*3/uL (ref 0.0–0.1)
Basophils Relative: 0 % (ref 0–1)
Eosinophils Absolute: 0.1 10*3/uL (ref 0.0–0.7)
Eosinophils Relative: 1 % (ref 0–5)
HCT: 41.9 % (ref 36.0–46.0)
Hemoglobin: 14 g/dL (ref 12.0–15.0)
Lymphocytes Relative: 25 % (ref 12–46)
Lymphs Abs: 2.7 10*3/uL (ref 0.7–4.0)
MCH: 29.2 pg (ref 26.0–34.0)
MCHC: 33.4 g/dL (ref 30.0–36.0)
MCV: 87.5 fL (ref 78.0–100.0)
Monocytes Absolute: 1.6 10*3/uL — ABNORMAL HIGH (ref 0.1–1.0)
Monocytes Relative: 14 % — ABNORMAL HIGH (ref 3–12)
Neutro Abs: 6.4 10*3/uL (ref 1.7–7.7)
Neutrophils Relative %: 59 % (ref 43–77)
Platelets: 534 10*3/uL — ABNORMAL HIGH (ref 150–400)
RBC: 4.79 MIL/uL (ref 3.87–5.11)
RDW: 13.8 % (ref 11.5–15.5)
WBC: 10.8 10*3/uL — ABNORMAL HIGH (ref 4.0–10.5)

## 2012-10-07 LAB — URINALYSIS, ROUTINE W REFLEX MICROSCOPIC
Bilirubin Urine: NEGATIVE
Glucose, UA: NEGATIVE mg/dL
Hgb urine dipstick: NEGATIVE
Ketones, ur: NEGATIVE mg/dL
Leukocytes, UA: NEGATIVE
Nitrite: NEGATIVE
Protein, ur: NEGATIVE mg/dL
Specific Gravity, Urine: 1.004 — ABNORMAL LOW (ref 1.005–1.030)
Urobilinogen, UA: 0.2 mg/dL (ref 0.0–1.0)
pH: 7 (ref 5.0–8.0)

## 2012-10-07 MED ORDER — DIAZEPAM 5 MG PO TABS
5.0000 mg | ORAL_TABLET | Freq: Once | ORAL | Status: AC
  Administered 2012-10-07: 5 mg via ORAL
  Filled 2012-10-07: qty 1

## 2012-10-07 MED ORDER — LORAZEPAM 1 MG PO TABS: 1.0000 mg | ORAL_TABLET | Freq: Three times a day (TID) | ORAL | Status: DC | PRN

## 2012-10-07 MED ORDER — SODIUM CHLORIDE 0.9 % IV BOLUS (SEPSIS)
1000.0000 mL | Freq: Once | INTRAVENOUS | Status: AC
  Administered 2012-10-07: 1000 mL via INTRAVENOUS

## 2012-10-07 MED ORDER — HYDROCODONE-ACETAMINOPHEN 5-325 MG PO TABS: 1.0000 | ORAL_TABLET | ORAL | Status: DC | PRN

## 2012-10-07 MED ORDER — OXYCODONE-ACETAMINOPHEN 5-325 MG PO TABS
1.0000 | ORAL_TABLET | Freq: Once | ORAL | Status: AC
  Administered 2012-10-07: 1 via ORAL
  Filled 2012-10-07: qty 1

## 2012-10-07 MED ORDER — POTASSIUM CHLORIDE CRYS ER 20 MEQ PO TBCR
40.0000 meq | EXTENDED_RELEASE_TABLET | Freq: Once | ORAL | Status: AC
  Administered 2012-10-07: 40 meq via ORAL
  Filled 2012-10-07: qty 2

## 2012-10-07 NOTE — ED Notes (Signed)
Per EMS, pt from home with reports of losing spouse last week and due to increased anxiety and shortness of breath took about 4 mg (liquid form) of husband's Haldol to try and calm down. Per EMS, pt reports that symptoms did not improve and pt felt as if her throat was closing up so called 911. EMS reports inserting 20G in L hand but no meds given. EMS reports anxiety upon their arrival but no acute respiratory distress. EMS reports that pt denied SI.

## 2012-10-07 NOTE — ED Notes (Signed)
Pt up to bathroom with daughter. Steady ambulation

## 2012-10-07 NOTE — ED Notes (Signed)
[id number]<AV> Expected date:<BR> Expected time:<BR> Means of arrival:<BR> Comments:<BR> Pt took her deceased husbands meds, c/o SOB

## 2012-10-07 NOTE — ED Provider Notes (Signed)
History     CSN: [id number]  Arrival date & time 10/07/12  4098   First MD Initiated Contact with Patient 10/07/12 1922      Chief Complaint  Patient presents with  . Shortness of Breath  . Anxiety     The history is provided by the patient and a relative.   patient relative reports some spasm of her neck muscles as well as facial twitching which began today.  The patient stated she was extremely anxious over her husband's recent death one week ago.  To try and help her symptoms including her sense of anxiousness she tried some of her husbands Haldol which did not improve her symptoms.  The patient developed some tingling in her fingertips bilaterally.  EMS was called the patient brought to the emergency apartment.  Shared he feels better at this time.  She reports that she's had a lot of stress over the past week after the death of her husband.  She also lost her Xanax and her hydrocodone in the hospice home has been unable to locate these.  No weakness of her upper lower extremities.  Past Medical History  Diagnosis Date  . Hypertension   . Hypothyroidism   . Depression   . COPD (chronic obstructive pulmonary disease)     told has copd, no current inhaler use  . GERD (gastroesophageal reflux disease)   . Migraine   . Anxiety     panic attacks  . Migraine   . Insomnia     Past Surgical History  Procedure Laterality Date  . Bladder tac    . Laparotomy  10/04/2011, colostomy also    Procedure: EXPLORATORY LAPAROTOMY;  Surgeon: Ernestene Mention, MD;  Location: [facility];  Service: General;  Laterality: N/A;  left partial colectomy with colostomy  . Tubal ligation    . Laparotomy  04/19/2012    Procedure: EXPLORATORY LAPAROTOMY;  Surgeon: Ernestene Mention, MD;  Location: [facility];  Service: General;  Laterality: N/A;  . Colostomy closure  04/19/2012    Procedure: COLOSTOMY CLOSURE;  Surgeon: Ernestene Mention, MD;  Location: [facility];  Service: General;  Laterality: N/A;  Laparotomy,  Resection and Closure of Colostomy  . Ventral hernia repair  04/19/2012    Procedure: HERNIA REPAIR VENTRAL ADULT;  Surgeon: Ernestene Mention, MD;  Location: [facility];  Service: General;  Laterality: N/A;  . Abdominal hysterectomy    . Colon surgery      Family History  Problem Relation Age of Onset  . Heart disease Mother   . Cancer Sister   . Diabetes Brother   . Cancer Sister   . Diabetes Sister   . Heart disease Sister   . Cancer Other     GE junction adenocarcinoma    History  Substance Use Topics  . Smoking status: Former Smoker -- 1.50 packs/day for 20 years    Types: Cigarettes    Quit date: 10/03/1997  . Smokeless tobacco: Never Used  . Alcohol Use: No    OB History   Grav Para Term Preterm Abortions TAB SAB Ect Mult Living                  Review of Systems  Respiratory: Positive for shortness of breath.   All other systems reviewed and are negative.    Allergies  Toradol; Demerol; Esgic; and Iodine  Home Medications   Current Outpatient Rx  Name  Route  Sig  Dispense  Refill  .  ALPRAZolam (XANAX) 0.5 MG tablet   Oral   Take 0.5 mg by mouth 2 (two) times daily as needed for sleep or anxiety.         . Aspirin-Salicylamide-Caffeine (BC HEADACHE POWDER PO)   Oral   Take 1 packet by mouth every 6 (six) hours as needed. For aches and pains         . fenofibrate 54 MG tablet   Oral   Take 54 mg by mouth daily.         [name] HYDROcodone-acetaminophen (NORCO/VICODIN) 5-325 MG per tablet   Oral   Take 1 tablet by mouth every 6 (six) hours as needed. For pain   90 tablet   0   . levothyroxine (SYNTHROID, LEVOTHROID) 75 MCG tablet   Oral   Take 75 mcg by mouth every morning.          . nitrofurantoin, macrocrystal-monohydrate, (MACROBID) 100 MG capsule   Oral   Take 1 capsule (100 mg total) by mouth 2 (two) times daily.   20 capsule   0   . Olmesartan-Amlodipine-HCTZ (TRIBENZOR) 40-10-12.5 MG TABS   Oral   Take 1 tablet by mouth every  morning.          . zolpidem (AMBIEN CR) 12.5 MG CR tablet   Oral   Take 12.5 mg by mouth at bedtime as needed. For sleep         . acetaminophen (TYLENOL) 325 MG tablet   Oral   Take 2 tablets (650 mg total) by mouth every 4 (four) hours as needed for fever.         . docusate sodium (COLACE) 100 MG capsule   Oral   Take 100 mg by mouth at bedtime.         [name] HYDROcodone-acetaminophen (NORCO/VICODIN) 5-325 MG per tablet   Oral   Take 1 tablet by mouth every 4 (four) hours as needed for pain.   15 tablet   0   . LORazepam (ATIVAN) 1 MG tablet   Oral   Take 1 tablet (1 mg total) by mouth 3 (three) times daily as needed for anxiety.   15 tablet   0   . promethazine (PHENERGAN) 25 MG tablet      1 tablet every 8 hours as needed for nausea with pain medicine.   60 tablet   1     BP 153/79  Pulse 85  Temp(Src) 98.2 F (36.8 C) (Oral)  Resp 20  SpO2 100%  Physical Exam  Nursing note and vitals reviewed. Constitutional: She is oriented to person, place, and time. She appears well-developed and well-nourished. No distress.  HENT:  Head: Normocephalic and atraumatic.  Bilateral sternocleidomastoid spasm with some twitching of both her right and left facial muscles.  Eyes: EOM are normal.  Neck: Normal range of motion.  Cardiovascular: Normal rate, regular rhythm and normal heart sounds.   Pulmonary/Chest: Effort normal and breath sounds normal.  Abdominal: Soft. She exhibits no distension. There is no tenderness.  Musculoskeletal: Normal range of motion.  Neurological: She is alert and oriented to person, place, and time.  Skin: Skin is warm and dry.  Psychiatric: She has a normal mood and affect. Judgment normal.    ED Course  Procedures (including critical care time)  Labs Reviewed  CBC WITH DIFFERENTIAL - Abnormal; Notable for the following:    WBC 10.8 (*)    Platelets 534 (*)    Monocytes Relative 14 (*)  Monocytes Absolute 1.6 (*)    All other  components within normal limits  COMPREHENSIVE METABOLIC PANEL - Abnormal; Notable for the following:    Potassium 2.8 (*)    Chloride 95 (*)    Glucose, Bld 104 (*)    All other components within normal limits  URINALYSIS, ROUTINE W REFLEX MICROSCOPIC - Abnormal; Notable for the following:    Specific Gravity, Urine 1.004 (*)    All other components within normal limits   No results found.   1. Neck muscle spasm   2. Hypokalemia       MDM  9:55 PM The patient's sternocleidomastoid spasm and facial twitching and spasm has resolved.  This seems to be stress related.  Potassium repleted.  The rest will will be replaced by diet.  I suspect this is from stress of her husband's recent death.  She also has run out of her Xanax as well as her hydrocodone.  These will both the replaced.  Close PCP followup.        Lyanne Co, MD 10/07/12 2158

## 2012-10-14 NOTE — Telephone Encounter (Signed)
Last written 06/07/12   Last seen 09/10/12  Have nurse call RX in and let patient know

## 2012-10-15 ENCOUNTER — Other Ambulatory Visit: Payer: Self-pay | Admitting: Family Medicine

## 2012-10-15 MED ORDER — ALPRAZOLAM 0.5 MG PO TABS: 0.5000 mg | ORAL_TABLET | Freq: Two times a day (BID) | ORAL | Status: DC | PRN

## 2012-10-21 ENCOUNTER — Telehealth: Payer: Self-pay | Admitting: Family Medicine

## 2012-10-21 ENCOUNTER — Ambulatory Visit (INDEPENDENT_AMBULATORY_CARE_PROVIDER_SITE_OTHER): Admitting: Family Medicine

## 2012-10-21 VITALS — BP 154/88 | HR 85 | Temp 97.5°F | Wt 131.2 lb

## 2012-10-21 DIAGNOSIS — G894 Chronic pain syndrome: Secondary | ICD-10-CM

## 2012-10-21 DIAGNOSIS — M549 Dorsalgia, unspecified: Secondary | ICD-10-CM

## 2012-10-21 DIAGNOSIS — B349 Viral infection, unspecified: Secondary | ICD-10-CM

## 2012-10-21 DIAGNOSIS — J029 Acute pharyngitis, unspecified: Secondary | ICD-10-CM

## 2012-10-21 DIAGNOSIS — B9789 Other viral agents as the cause of diseases classified elsewhere: Secondary | ICD-10-CM

## 2012-10-21 LAB — POCT RAPID STREP A (OFFICE): Rapid Strep A Screen: NEGATIVE

## 2012-10-21 MED ORDER — HYDROCODONE-ACETAMINOPHEN 5-325 MG PO TABS: 1.0000 | ORAL_TABLET | Freq: Four times a day (QID) | ORAL | Status: DC | PRN

## 2012-10-21 NOTE — Progress Notes (Signed)
Patient ID: Stacy Dalton, female   DOB: Jan 13, 1951, 62 y.o.   MRN: [id number] SUBJECTIVE: HPI: Sore Throat Patient complains of sore throat. Associated symptoms include nasal blockage, pain while swallowing, post nasal drip, sinus and nasal congestion and sore throat. Onset of symptoms was 2 days ago, and have been gradually worsening since that time. She is drinking plenty of fluids. She has not had recent close exposure to someone with proven streptococcal pharyngitis. Husband died recently at the hospice house. Cleaning up the room at hospice and home she may have misplaced her apiod pain medication or her brother help[ing her may have taken her prescription. She has been in a emotional turmoil that she has no idea where the prescription is. She is having a flare up of her back pain. She had terrible spasms and called the EMS and was transferred to the ED. She was evaluated.eventually discharged home.  PMH/PSH: reviewed/updated in Epic  SH/FH: reviewed/updated in Epic  Allergies: reviewed/updated in Epic  Medications: reviewed/updated in Epic  Immunizations: reviewed/updated in Epic  ROS: As above in the HPI. All other systems are stable or negative.  OBJECTIVE: APPEARANCE: sad tearfull WF Patient in no acute distress.The patient appeared well nourished and normally developed. Acyanotic. Waist: VITAL SIGNS:BP 154/88  Pulse 85  Temp(Src) 97.5 F (36.4 C) (Oral)  Wt 131 lb 3.2 oz (59.512 kg)  BMI 23.99 kg/m2   SKIN: warm and  Dry without overt rashes, tattoos and scars  HEAD and Neck: without JVD, Head and scalp: normal Eyes:No scleral icterus. Fundi normal, eye movements normal. Ears: Auricle normal, canal normal, Tympanic membranes normal, insufflation normal. Nose: normal Throat: normal Neck & thyroid: normal  CHEST & LUNGS: Chest wall: normal Lungs: Clear  [facility]: Reveals the PMI to be normally located. Regular rhythm, First and Second Heart sounds are normal,   absence of murmurs, rubs or gallops. Peripheral vasculature: Radial pulses: normal Dorsal pedis pulses: normal Posterior pulses: normal  ABDOMEN:  Appearance: normal Benign,, no organomegaly, no masses, no Abdominal Aortic enlargement. No Guarding , no rebound. No Bruits. Bowel sounds: normal  RECTAL: N/A GU: N/A  EXTREMETIES: nonedematous. Both Femoral and Pedal pulses are normal.  MUSCULOSKELETAL:  Spine:decreased ROM secondary to pain.  NEUROLOGIC: oriented to time,place and person; nonfocal. Strength is normal  ASSESSMENT: Sore throat - Plan: POCT rapid strep A  Viral syndrome  Chronic pain syndrome  Back pain  PLAN:  Orders Placed This Encounter  Procedures  . POCT rapid strep A   Results for orders placed in visit on 10/21/12 (from the past 24 hour(s))  POCT RAPID STREP A (OFFICE)     Status: None   Collection Time    10/21/12  5:05 PM      Result Value Range   Rapid Strep A Screen Negative  Negative   Meds ordered this encounter  Medications  . escitalopram (LEXAPRO) 20 MG tablet    Sig: Take 20 mg by mouth daily.  [name] HYDROcodone-acetaminophen (NORCO/VICODIN) 5-325 MG per tablet    Sig: Take 1 tablet by mouth every 6 (six) hours as needed. For pain    Dispense:  40 tablet    Refill:  0  saline gargles. Fluids. Supportive therapy, Grief counselling. Recommend ongoing hospice grief counselling.  RTc in 4 weeks.  Emberleigh Reily P. [place] Charon, M.D.

## 2012-10-21 NOTE — Telephone Encounter (Signed)
This encounter was created in error - please disregard.

## 2012-10-21 NOTE — Telephone Encounter (Signed)
Appt made

## 2012-10-22 ENCOUNTER — Other Ambulatory Visit: Payer: Self-pay

## 2012-10-22 NOTE — Telephone Encounter (Signed)
Last written 09/03/12  Last seen 09/10/12  If approved please print and have nurse notify patient to pick up

## 2012-10-29 ENCOUNTER — Telehealth: Payer: Self-pay | Admitting: Family Medicine

## 2012-10-29 MED ORDER — FENOFIBRATE 54 MG PO TABS: 54.0000 mg | ORAL_TABLET | Freq: Every day | ORAL | Status: DC

## 2012-10-29 MED ORDER — ESCITALOPRAM OXALATE 20 MG PO TABS: 20.0000 mg | ORAL_TABLET | Freq: Every day | ORAL | Status: DC

## 2012-10-30 NOTE — Telephone Encounter (Signed)
Done at [facility], by escribe

## 2012-11-05 ENCOUNTER — Other Ambulatory Visit

## 2012-11-06 ENCOUNTER — Other Ambulatory Visit (INDEPENDENT_AMBULATORY_CARE_PROVIDER_SITE_OTHER)

## 2012-11-06 DIAGNOSIS — I10 Essential (primary) hypertension: Secondary | ICD-10-CM

## 2012-11-06 DIAGNOSIS — E785 Hyperlipidemia, unspecified: Secondary | ICD-10-CM

## 2012-11-06 LAB — COMPREHENSIVE METABOLIC PANEL
ALT: 18 U/L (ref 0–35)
AST: 18 U/L (ref 0–37)
Albumin: 4.3 g/dL (ref 3.5–5.2)
Alkaline Phosphatase: 65 U/L (ref 39–117)
BUN: 10 mg/dL (ref 6–23)
CO2: 24 mEq/L (ref 19–32)
Calcium: 9.7 mg/dL (ref 8.4–10.5)
Chloride: 99 mEq/L (ref 96–112)
Creat: 0.79 mg/dL (ref 0.50–1.10)
Glucose, Bld: 82 mg/dL (ref 70–99)
Potassium: 3.7 mEq/L (ref 3.5–5.3)
Sodium: 136 mEq/L (ref 135–145)
Total Bilirubin: 0.5 mg/dL (ref 0.3–1.2)
Total Protein: 6.7 g/dL (ref 6.0–8.3)

## 2012-11-06 NOTE — Progress Notes (Signed)
Patient here today for labs only. Labs were ordered by [facility]

## 2012-11-07 ENCOUNTER — Ambulatory Visit: Admitting: Family Medicine

## 2012-11-08 LAB — NMR LIPOPROFILE WITH LIPIDS
Cholesterol, Total: 172 mg/dL (ref <200)
HDL Particle Number: 33.1 umol/L (ref >=30.5)
HDL Size: 9 nm — ABNORMAL LOW (ref >=9.2)
HDL-C: 55 mg/dL (ref >=40)
LDL (calc): 101 mg/dL — ABNORMAL HIGH (ref <100)
LDL Particle Number: 1318 nmol/L — ABNORMAL HIGH (ref <1000)
LDL Size: 21 nm (ref >20.5)
LP-IR Score: 27 (ref <=45)
Large HDL-P: 5.2 umol/L (ref >=4.8)
Large VLDL-P: <0.8 nmol/L (ref <=2.7)
Small LDL Particle Number: 390 nmol/L (ref <=527)
Triglycerides: 80 mg/dL (ref <150)
VLDL Size: 38.1 nm (ref <=46.6)

## 2012-11-10 ENCOUNTER — Other Ambulatory Visit: Payer: Self-pay | Admitting: Family Medicine

## 2012-11-10 DIAGNOSIS — E785 Hyperlipidemia, unspecified: Secondary | ICD-10-CM

## 2012-11-10 MED ORDER — FENOFIBRATE 120 MG PO TABS: 120.0000 mg | ORAL_TABLET | Freq: Every day | ORAL | Status: DC

## 2012-11-11 ENCOUNTER — Telehealth: Payer: Self-pay | Admitting: Family Medicine

## 2012-11-11 ENCOUNTER — Other Ambulatory Visit: Payer: Self-pay | Admitting: Family Medicine

## 2012-11-11 NOTE — Telephone Encounter (Signed)
LAST SEEN 10/21/12, LAST FILLED 10/15/12. Have Almira Coaster H [facility] if approved

## 2012-11-12 NOTE — Telephone Encounter (Signed)
Fenofibrate was the medication that was increased because of lab results.

## 2012-11-12 NOTE — Telephone Encounter (Signed)
Spoke with pt -she stated her cholesterol med had been changed from fenfofibrate 54 mg to fenoglide 120mg . . Her lab report says Fenofibrate 130mg   Please advise

## 2012-11-12 NOTE — Telephone Encounter (Signed)
Pt aware.

## 2012-11-18 ENCOUNTER — Ambulatory Visit (INDEPENDENT_AMBULATORY_CARE_PROVIDER_SITE_OTHER): Admitting: Family Medicine

## 2012-11-18 ENCOUNTER — Telehealth: Payer: Self-pay | Admitting: Family Medicine

## 2012-11-18 ENCOUNTER — Encounter: Payer: Self-pay | Admitting: Family Medicine

## 2012-11-18 ENCOUNTER — Other Ambulatory Visit: Payer: Self-pay

## 2012-11-18 VITALS — BP 136/70 | HR 71 | Temp 97.9°F | Ht 61.0 in | Wt 128.4 lb

## 2012-11-18 DIAGNOSIS — F329 Major depressive disorder, single episode, unspecified: Secondary | ICD-10-CM

## 2012-11-18 DIAGNOSIS — F3289 Other specified depressive episodes: Secondary | ICD-10-CM

## 2012-11-18 DIAGNOSIS — G894 Chronic pain syndrome: Secondary | ICD-10-CM | POA: Insufficient documentation

## 2012-11-18 DIAGNOSIS — G4700 Insomnia, unspecified: Secondary | ICD-10-CM | POA: Insufficient documentation

## 2012-11-18 DIAGNOSIS — E785 Hyperlipidemia, unspecified: Secondary | ICD-10-CM

## 2012-11-18 DIAGNOSIS — F32A Depression, unspecified: Secondary | ICD-10-CM | POA: Insufficient documentation

## 2012-11-18 DIAGNOSIS — N390 Urinary tract infection, site not specified: Secondary | ICD-10-CM

## 2012-11-18 DIAGNOSIS — R350 Frequency of micturition: Secondary | ICD-10-CM

## 2012-11-18 LAB — POCT URINALYSIS DIPSTICK
Bilirubin, UA: NEGATIVE
Glucose, UA: NEGATIVE
Ketones, UA: NEGATIVE
Nitrite, UA: NEGATIVE
Protein, UA: NEGATIVE
Spec Grav, UA: <=1.005
Urobilinogen, UA: NEGATIVE
pH, UA: 5

## 2012-11-18 LAB — POCT UA - MICROSCOPIC ONLY
Casts, Ur, LPF, POC: NEGATIVE
Crystals, Ur, HPF, POC: NEGATIVE
Mucus, UA: NEGATIVE
Yeast, UA: NEGATIVE

## 2012-11-18 MED ORDER — NITROFURANTOIN MONOHYD MACRO 100 MG PO CAPS: 100.0000 mg | ORAL_CAPSULE | Freq: Two times a day (BID) | ORAL | Status: DC

## 2012-11-18 MED ORDER — MIRTAZAPINE 15 MG PO TABS: 15.0000 mg | ORAL_TABLET | Freq: Every day | ORAL | Status: DC

## 2012-11-18 MED ORDER — FENOFIBRATE 120 MG PO TABS: 120.0000 mg | ORAL_TABLET | Freq: Every day | ORAL | Status: DC

## 2012-11-18 MED ORDER — HYDROCODONE-ACETAMINOPHEN 5-325 MG PO TABS: 1.0000 | ORAL_TABLET | Freq: Four times a day (QID) | ORAL | Status: DC | PRN

## 2012-11-18 NOTE — Telephone Encounter (Signed)
APPT MADE

## 2012-11-18 NOTE — Progress Notes (Signed)
Patient ID: Stacy Dalton, female   DOB: 12/11/1950, 62 y.o.   MRN: [id number] SUBJECTIVE: HPI: Came because of dysuria. No fever , no back pain, no vaginal discharge. Also, was supposed to come in for discussion of her meds. Frequent refills of meds especially opiods for pain control and benzodiazepines. I had  Expressed concern that she has increased use of meds since her husband died of cancer. She has been coping better now and is able to modify meds and willing to adjust meds. For her the primary concern is pain control.  PMH/PSH: reviewed/updated in Epic  SH/FH: reviewed/updated in Epic  Allergies: reviewed/updated in Epic  Medications: reviewed/updated in Epic  Immunizations: reviewed/updated in Epic  ROS: As above in the HPI. All other systems are stable or negative.  OBJECTIVE: APPEARANCE:  Patient in no acute distress.The patient appeared well nourished and normally developed. Acyanotic. Waist: VITAL SIGNS:BP 136/70  Pulse 71  Temp(Src) 97.9 F (36.6 C) (Oral)  Ht 5\' 1"  (1.549 m)  Wt 128 lb 6.4 oz (58.242 kg)  BMI 24.27 kg/m2   SKIN: warm and  Dry without overt rashes, tattoos and scars  HEAD and Neck: without JVD, Head and scalp: normal Eyes:No scleral icterus. Fundi normal, eye movements normal. Ears: Auricle normal, canal normal, Tympanic membranes normal, insufflation normal. Nose: normal Throat: normal Neck & thyroid: normal  CHEST & LUNGS: Chest wall: normal Lungs: Clear  CVS: Reveals the PMI to be normally located. Regular rhythm, First and Second Heart sounds are normal,  absence of murmurs, rubs or gallops. Peripheral vasculature: Radial pulses: normal Dorsal pedis pulses: normal Posterior pulses: normal  ABDOMEN:  Appearance: normal Benign,, no organomegaly, no masses, no Abdominal Aortic enlargement. No Guarding , no rebound. No Bruits. Tender suprapubically. Bowel sounds: normal  RECTAL: N/A GU: N/A  EXTREMETIES:  nonedematous. Both Femoral and Pedal pulses are normal.  MUSCULOSKELETAL:  Spine:abnormal, with reduced ROM and lumbar pain.   NEUROLOGIC: oriented to time,place and person; nonfocal.  Psychiatric: not suicidal , not hallucinating. She is  Sad talking about her husband but this is expected and appropriate due to recent loss.  ASSESSMENT: Frequent urination - Plan: POCT urinalysis dipstick, POCT UA - Microscopic Only, Urine culture, nitrofurantoin, macrocrystal-monohydrate, (MACROBID) 100 MG capsule  UTI (urinary tract infection) - Plan: Urine culture, nitrofurantoin, macrocrystal-monohydrate, (MACROBID) 100 MG capsule  Depression - Plan: mirtazapine (REMERON) 15 MG tablet  Insomnia - Plan: mirtazapine (REMERON) 15 MG tablet  Chronic pain syndrome - Plan: HYDROcodone-acetaminophen (NORCO/VICODIN) 5-325 MG per tablet  PLAN:  Orders Placed This Encounter  Procedures  . Urine culture  . POCT urinalysis dipstick  . POCT UA - Microscopic Only   Results for orders placed in visit on 11/18/12  POCT URINALYSIS DIPSTICK      Result Value Range   Color, UA clear     Clarity, UA clear     Glucose, UA negative     Bilirubin, UA negative     Ketones, UA negative     Spec Grav, UA <=1.005     Blood, UA large     pH, UA 5.0     Protein, UA negative     Urobilinogen, UA negative     Nitrite, UA negative     Leukocytes, UA large (3+)    POCT UA - MICROSCOPIC ONLY      Result Value Range   WBC, Ur, HPF, POC 40-80     RBC, urine, microscopic 5-8     Bacteria, U  Microscopic few     Mucus, UA negative     Epithelial cells, urine per micros few     Crystals, Ur, HPF, POC negative     Casts, Ur, LPF, POC negative     Yeast, UA negative     Meds ordered this encounter  Medications  . nitrofurantoin, macrocrystal-monohydrate, (MACROBID) 100 MG capsule    Sig: Take 1 capsule (100 mg total) by mouth 2 (two) times daily.    Dispense:  20 capsule    Refill:  0  . mirtazapine (REMERON)  15 MG tablet    Sig: Take 1 tablet (15 mg total) by mouth at bedtime.    Dispense:  30 tablet    Refill:  3  . HYDROcodone-acetaminophen (NORCO/VICODIN) 5-325 MG per tablet    Sig: Take 1 tablet by mouth every 6 (six) hours as needed. For pain    Dispense:  60 tablet    Refill:  0   D/C the alprazolam; d/c the ambien, d/c the lexapro and  Switched to mirtazepine.  RTc in 4 weeks for  Recheck.  Margerie Fraiser P. [place] Charon, M.D.

## 2012-11-19 LAB — URINE CULTURE
Colony Count: NO GROWTH
Organism ID, Bacteria: NO GROWTH

## 2012-11-20 NOTE — Progress Notes (Signed)
Quick Note:  Call patient. URINE Culture negative Labs normal. No change in plan. If symptoms does not resolve will need to see Urologist. ______

## 2012-11-21 ENCOUNTER — Ambulatory Visit: Admitting: Family Medicine

## 2012-12-11 ENCOUNTER — Telehealth: Payer: Self-pay | Admitting: Family Medicine

## 2012-12-11 DIAGNOSIS — R300 Dysuria: Secondary | ICD-10-CM

## 2012-12-11 NOTE — Telephone Encounter (Signed)
Ok per dr Modesto Charon to leave urine spec Pt aware.  States been on cipro for few days  Pt advised results may not be accurate.

## 2012-12-13 ENCOUNTER — Other Ambulatory Visit

## 2012-12-13 ENCOUNTER — Telehealth: Payer: Self-pay | Admitting: *Deleted

## 2012-12-13 ENCOUNTER — Other Ambulatory Visit: Payer: Self-pay | Admitting: Nurse Practitioner

## 2012-12-13 DIAGNOSIS — N390 Urinary tract infection, site not specified: Secondary | ICD-10-CM

## 2012-12-13 DIAGNOSIS — R300 Dysuria: Secondary | ICD-10-CM

## 2012-12-13 DIAGNOSIS — R350 Frequency of micturition: Secondary | ICD-10-CM

## 2012-12-13 LAB — POCT URINALYSIS DIPSTICK
Bilirubin, UA: NEGATIVE
Glucose, UA: NEGATIVE
Ketones, UA: NEGATIVE
Nitrite, UA: NEGATIVE
Protein, UA: NEGATIVE
Spec Grav, UA: <=1.005
Urobilinogen, UA: NEGATIVE
pH, UA: 5

## 2012-12-13 MED ORDER — NITROFURANTOIN MONOHYD MACRO 100 MG PO CAPS: 100.0000 mg | ORAL_CAPSULE | Freq: Two times a day (BID) | ORAL | Status: DC

## 2012-12-13 NOTE — Telephone Encounter (Signed)
Patient has been taking cipro a couple of days that she had from the past

## 2012-12-13 NOTE — Progress Notes (Unsigned)
Patient came in for labs only.

## 2012-12-13 NOTE — Telephone Encounter (Signed)
macrobid Rx sent to pharmacy

## 2012-12-13 NOTE — Telephone Encounter (Signed)
Dr. Mod[place] Charon told patient to come by for urine and she has a UTI can you please look at her labs results and address urine reults.

## 2012-12-13 NOTE — Telephone Encounter (Signed)
Patient aware.

## 2012-12-16 ENCOUNTER — Encounter: Payer: Self-pay | Admitting: Family Medicine

## 2012-12-16 ENCOUNTER — Ambulatory Visit (INDEPENDENT_AMBULATORY_CARE_PROVIDER_SITE_OTHER): Admitting: Family Medicine

## 2012-12-16 VITALS — BP 124/73 | HR 75 | Temp 97.7°F | Wt 124.6 lb

## 2012-12-16 DIAGNOSIS — E785 Hyperlipidemia, unspecified: Secondary | ICD-10-CM

## 2012-12-16 DIAGNOSIS — F32A Depression, unspecified: Secondary | ICD-10-CM

## 2012-12-16 DIAGNOSIS — J449 Chronic obstructive pulmonary disease, unspecified: Secondary | ICD-10-CM

## 2012-12-16 DIAGNOSIS — G8929 Other chronic pain: Secondary | ICD-10-CM

## 2012-12-16 DIAGNOSIS — Z933 Colostomy status: Secondary | ICD-10-CM

## 2012-12-16 DIAGNOSIS — N390 Urinary tract infection, site not specified: Secondary | ICD-10-CM

## 2012-12-16 DIAGNOSIS — M25559 Pain in unspecified hip: Secondary | ICD-10-CM

## 2012-12-16 DIAGNOSIS — I10 Essential (primary) hypertension: Secondary | ICD-10-CM

## 2012-12-16 DIAGNOSIS — G894 Chronic pain syndrome: Secondary | ICD-10-CM

## 2012-12-16 DIAGNOSIS — M549 Dorsalgia, unspecified: Secondary | ICD-10-CM

## 2012-12-16 DIAGNOSIS — G43909 Migraine, unspecified, not intractable, without status migrainosus: Secondary | ICD-10-CM

## 2012-12-16 DIAGNOSIS — G4700 Insomnia, unspecified: Secondary | ICD-10-CM

## 2012-12-16 DIAGNOSIS — F329 Major depressive disorder, single episode, unspecified: Secondary | ICD-10-CM

## 2012-12-16 DIAGNOSIS — F3289 Other specified depressive episodes: Secondary | ICD-10-CM

## 2012-12-16 DIAGNOSIS — J4489 Other specified chronic obstructive pulmonary disease: Secondary | ICD-10-CM

## 2012-12-16 DIAGNOSIS — R350 Frequency of micturition: Secondary | ICD-10-CM

## 2012-12-16 LAB — FOLATE: Folate: >20 ng/mL

## 2012-12-16 LAB — TSH: TSH: 1.193 u[IU]/mL (ref 0.350–4.500)

## 2012-12-16 LAB — VITAMIN B12: Vitamin B-12: 376 pg/mL (ref 211–911)

## 2012-12-16 MED ORDER — HYDROCODONE-ACETAMINOPHEN 5-325 MG PO TABS: 1.0000 | ORAL_TABLET | Freq: Three times a day (TID) | ORAL | Status: DC | PRN

## 2012-12-16 NOTE — Progress Notes (Signed)
Patient ID: Stacy Dalton, female   DOB: 12-Apr-1951, 62 y.o.   MRN: [id number] SUBJECTIVE: CC: Chief Complaint  Patient presents with  . Urinary Tract Infection    had urinalysis  friday. wants off remeron    HPI: Wants to be off remeron because it makes her drowsy all day. Doing worse on it than when she was off. In a couple of support groups.  Chronic pains in the sides and blower back. Still grieving but coping better. Has polyuria.  PMH/PSH: reviewed/updated in Epic  SH/FH: reviewed/updated in Epic  Allergies: reviewed/updated in Epic  Medications: reviewed/updated in Epic  Immunizations: reviewed/updated in Epic  ROS: As above in the HPI. All other systems are stable or negative.  OBJECTIVE: APPEARANCE:  Patient in no acute distress.The patient appeared well nourished and normally developed. Acyanotic. Waist: VITAL SIGNS:BP 124/73  Pulse 75  Temp(Src) 97.7 F (36.5 C) (Oral)  Wt 124 lb 9.6 oz (56.518 kg)  BMI 23.56 kg/m2  WF   SKIN: warm and  Dry without overt rashes, tattoos and scars  HEAD and Neck: without JVD, Head and scalp: normal Eyes:No scleral icterus. Fundi normal, eye movements normal. Ears: Auricle normal, canal normal, Tympanic membranes normal, insufflation normal. Nose: normal Throat: normal Neck & thyroid: normal  CHEST & LUNGS: Chest wall: normal Lungs: Clear  CVS: Reveals the PMI to be normally located. Regular rhythm, First and Second Heart sounds are normal,  absence of murmurs, rubs or gallops. Peripheral vasculature: Radial pulses: normal Dorsal pedis pulses: normal Posterior pulses: normal  ABDOMEN:  Appearance: scarred Benign, no organomegaly, no masses, no Abdominal Aortic enlargement. No Guarding , no rebound. No Bruits. Bowel sounds: normal  RECTAL: N/A GU: N/A  EXTREMETIES: nonedematous. Both Femoral and Pedal pulses are normal.  MUSCULOSKELETAL:  Spine: pain in ROM   NEUROLOGIC: oriented to  time,place and person; nonfocal. Strength is normal Sensory is normal Reflexes are normal Cranial Nerves are normal.  ASSESSMENT: Chronic pain syndrome - Plan: HYDROcodone-acetaminophen (NORCO/VICODIN) 5-325 MG per tablet, TSH, Folate, Vitamin B12, Vitamin D 25 hydroxy, Arthritis Panel  Infection of urinary tract - Plan: Urine culture  Chronic arthralgias of knees and hips, unspecified laterality - Plan: TSH, Folate, Vitamin B12, Vitamin D 25 hydroxy  S/P colostomy  Migraine  Insomnia  Hypertension  HLD (hyperlipidemia)  Frequent urination  Depression  COPD (chronic obstructive pulmonary disease)  Chronic back pain   PLAN:  See the urologist. Reduce the remeron to 1/2 tab daily for  2 weeks then 1/2 tablet every other day.  Return to clinic in 3 weeks to reassess weaning off the remeron and  Determine the replacement.  Discussed options. Patient reluctant. Consider lexapro. Orders Placed This Encounter  Procedures  . Urine culture  . TSH  . Folate  . Vitamin B12  . Vitamin D 25 hydroxy  . Arthritis Panel   Meds ordered this encounter  Medications  . HYDROcodone-acetaminophen (NORCO/VICODIN) 5-325 MG per tablet    Sig: Take 1 tablet by mouth every 8 (eight) hours as needed. For pain    Dispense:  60 tablet    Refill:  0   Return in about 3 weeks (around 01/06/2013) for Recheck medical problems. Grief counselling.  Lashell Moffitt P. [place] Charon, M.D.

## 2012-12-16 NOTE — Patient Instructions (Addendum)
See the urologist. Reduce the remeron to 1/2 tab daily for  2 weeks then 1/2 tablet every other day.  Return to clinic in 3 weeks to reassess weaning off the remeron and  Determine the replacement.

## 2012-12-17 LAB — ARTHRITIS PANEL
Anti Nuclear Antibody(ANA): NEGATIVE
Rhuematoid fact SerPl-aCnc: <10 IU/mL (ref <=14)
Sed Rate: 4 mm/hr (ref 0–22)
Uric Acid, Serum: 3.5 mg/dL (ref 2.4–6.0)

## 2012-12-17 LAB — URINE CULTURE
Colony Count: NO GROWTH
Organism ID, Bacteria: NO GROWTH

## 2012-12-17 LAB — VITAMIN D 25 HYDROXY (VIT D DEFICIENCY, FRACTURES): Vit D, 25-Hydroxy: 43 ng/mL (ref 30–89)

## 2012-12-18 NOTE — Progress Notes (Signed)
Quick Note:  Call patient. Labs normal. No change in plan. ______ 

## 2013-01-08 ENCOUNTER — Other Ambulatory Visit: Payer: Self-pay | Admitting: Family Medicine

## 2013-01-10 ENCOUNTER — Ambulatory Visit (INDEPENDENT_AMBULATORY_CARE_PROVIDER_SITE_OTHER): Admitting: Family Medicine

## 2013-01-10 ENCOUNTER — Encounter: Payer: Self-pay | Admitting: Family Medicine

## 2013-01-10 VITALS — BP 141/79 | HR 83 | Temp 98.7°F | Wt 126.0 lb

## 2013-01-10 DIAGNOSIS — G894 Chronic pain syndrome: Secondary | ICD-10-CM

## 2013-01-10 DIAGNOSIS — F4323 Adjustment disorder with mixed anxiety and depressed mood: Secondary | ICD-10-CM

## 2013-01-10 DIAGNOSIS — I10 Essential (primary) hypertension: Secondary | ICD-10-CM

## 2013-01-10 DIAGNOSIS — G4700 Insomnia, unspecified: Secondary | ICD-10-CM

## 2013-01-10 MED ORDER — ESCITALOPRAM OXALATE 10 MG PO TABS: 10.0000 mg | ORAL_TABLET | Freq: Every day | ORAL | Status: DC

## 2013-01-10 MED ORDER — OLMESARTAN-AMLODIPINE-HCTZ 40-10-12.5 MG PO TABS: 1.0000 | ORAL_TABLET | Freq: Every morning | ORAL | Status: DC

## 2013-01-10 MED ORDER — HYDROCODONE-ACETAMINOPHEN 5-325 MG PO TABS: 1.0000 | ORAL_TABLET | Freq: Three times a day (TID) | ORAL | Status: DC | PRN

## 2013-01-10 MED ORDER — ZOLPIDEM TARTRATE 5 MG PO TABS: 5.0000 mg | ORAL_TABLET | Freq: Every evening | ORAL | Status: DC | PRN

## 2013-01-10 MED ORDER — ZOLPIDEM TARTRATE ER 12.5 MG PO TBCR: 12.5000 mg | EXTENDED_RELEASE_TABLET | Freq: Every evening | ORAL | Status: DC | PRN

## 2013-01-10 NOTE — Progress Notes (Signed)
Patient ID: Stacy Dalton, female   DOB: 05-Apr-1951, 62 y.o.   MRN: [id number] SUBJECTIVE: CC: Chief Complaint  Patient presents with  . Follow-up    coming off antidepressant    HPI: Off the remeron. It made her sleepy. But now cannot sleep. Still has pain. Headaches due to lack of sleep. Back hurts  So bad  Some  Days, she still needs to take a pain pill to control it. Hips also hurts  Really bad. Has been off of ambien and feels  She needs to go back on it. wiiling to go on Lexapro again for the anxiety and  Depression. Coping with hospice counselling.  Past Medical History  Diagnosis Date  . Hypertension   . Hypothyroidism   . Depression   . COPD (chronic obstructive pulmonary disease)     told has copd, no current inhaler use  . GERD (gastroesophageal reflux disease)   . Migraine   . Anxiety     panic attacks  . Migraine   . Insomnia    Past Surgical History  Procedure Laterality Date  . Bladder tac    . Laparotomy  10/04/2011, colostomy also    Procedure: EXPLORATORY LAPAROTOMY;  Surgeon: Ernestene Mention, MD;  Location: [facility];  Service: General;  Laterality: N/A;  left partial colectomy with colostomy  . Tubal ligation    . Laparotomy  04/19/2012    Procedure: EXPLORATORY LAPAROTOMY;  Surgeon: Ernestene Mention, MD;  Location: [facility];  Service: General;  Laterality: N/A;  . Colostomy closure  04/19/2012    Procedure: COLOSTOMY CLOSURE;  Surgeon: Ernestene Mention, MD;  Location: [facility];  Service: General;  Laterality: N/A;  Laparotomy, Resection and Closure of Colostomy  . Ventral hernia repair  04/19/2012    Procedure: HERNIA REPAIR VENTRAL ADULT;  Surgeon: Ernestene Mention, MD;  Location: [facility];  Service: General;  Laterality: N/A;  . Abdominal hysterectomy    . Colon surgery     History   Social History  . Marital Status: Widowed    Spouse Name: N/A    Number of Children: N/A  . Years of Education: N/A   Occupational History  . Not on file.   Social  History Main Topics  . Smoking status: Former Smoker -- 1.50 packs/day for 20 years    Types: Cigarettes    Quit date: 10/03/1997  . Smokeless tobacco: Never Used  . Alcohol Use: No  . Drug Use: No  . Sexually Active: Not on file   Other Topics Concern  . Not on file   Social History Narrative   married   Family History  Problem Relation Age of Onset  . Heart disease Mother   . Cancer Sister   . Diabetes Brother   . Cancer Sister   . Diabetes Sister   . Heart disease Sister   . Cancer Other     GE junction adenocarcinoma   Current Outpatient Prescriptions on File Prior to Visit  Medication Sig Dispense Refill  . Aspirin-Salicylamide-Caffeine (BC HEADACHE POWDER PO) Take 1 packet by mouth every 6 (six) hours as needed. For aches and pains      . docusate sodium (COLACE) 100 MG capsule Take 100 mg by mouth at bedtime.      . Fenofibrate 120 MG TABS Take 1 tablet (120 mg total) by mouth daily.  30 tablet  5  . HYDROcodone-acetaminophen (NORCO/VICODIN) 5-325 MG per tablet Take 1 tablet by mouth every 8 (eight)  hours as needed. For pain  60 tablet  0  . levothyroxine (SYNTHROID, LEVOTHROID) 50 MCG tablet TAKE 1 TABLET EVERY DAY  30 tablet  11  . levothyroxine (SYNTHROID, LEVOTHROID) 75 MCG tablet Take 75 mcg by mouth every morning.       . mirtazapine (REMERON) 15 MG tablet Take 1 tablet (15 mg total) by mouth at bedtime.  30 tablet  3  . nitrofurantoin, macrocrystal-monohydrate, (MACROBID) 100 MG capsule Take 1 capsule (100 mg total) by mouth 2 (two) times daily.  20 capsule  0  . Olmesartan-Amlodipine-HCTZ (TRIBENZOR) 40-10-12.5 MG TABS Take 1 tablet by mouth every morning.        No current facility-administered medications on file prior to visit.   Allergies  Allergen Reactions  . Toradol (Ketorolac Tromethamine) Shortness Of Breath  . Demerol Swelling  . Esgic (Butalbital-Apap-Caffeine)     jittery  . Iodine Other (See Comments)    bp bottomed out per pt several hours  later; unsure if pre medicated in past with cm; done in [place]   Immunization History  Administered Date(s) Administered  . Influenza Whole 04/29/2012   Prior to Admission medications   Medication Sig Start Date End Date Taking? Authorizing Provider  Aspirin-Salicylamide-Caffeine (BC HEADACHE POWDER PO) Take 1 packet by mouth every 6 (six) hours as needed. For aches and pains    Historical Provider, MD  docusate sodium (COLACE) 100 MG capsule Take 100 mg by mouth at bedtime.    Historical Provider, MD  Fenofibrate 120 MG TABS Take 1 tablet (120 mg total) by mouth daily. 11/18/12   Ileana Ladd, MD  HYDROcodone-acetaminophen (NORCO/VICODIN) 5-325 MG per tablet Take 1 tablet by mouth every 8 (eight) hours as needed. For pain 12/16/12   Ileana Ladd, MD  levothyroxine (SYNTHROID, LEVOTHROID) 50 MCG tablet TAKE 1 TABLET EVERY DAY 01/08/13   Ernestina Penna, MD  levothyroxine (SYNTHROID, LEVOTHROID) 75 MCG tablet Take 75 mcg by mouth every morning.     Historical Provider, MD  mirtazapine (REMERON) 15 MG tablet Take 1 tablet (15 mg total) by mouth at bedtime. 11/18/12   Ileana Ladd, MD  nitrofurantoin, macrocrystal-monohydrate, (MACROBID) 100 MG capsule Take 1 capsule (100 mg total) by mouth 2 (two) times daily. 12/13/12   Mary-Margaret Daphine Deutscher, FNP  Olmesartan-Amlodipine-HCTZ (TRIBENZOR) 40-10-12.5 MG TABS Take 1 tablet by mouth every morning.     Historical Provider, MD     ROS: As above in the HPI. All other systems are stable or negative.  OBJECTIVE: APPEARANCE:  Patient in no acute distress.The patient appeared well nourished and normally developed. Acyanotic. Waist: VITAL SIGNS:BP 141/79  Pulse 83  Temp(Src) 98.7 F (37.1 C) (Oral)  Wt 126 lb (57.153 kg)  BMI 23.82 kg/m2 WF  SKIN: warm and  Dry without overt rashes, tattoos and scars  HEAD and Neck: without JVD, Head and scalp: normal Eyes:No scleral icterus. Fundi normal, eye movements normal. Ears: Auricle normal, canal  normal, Tympanic membranes normal, insufflation normal. Nose: normal Throat: normal Neck & thyroid: normal  CHEST & LUNGS: Chest wall: normal Lungs: Clear  CVS: Reveals the PMI to be normally located. Regular rhythm, First and Second Heart sounds are normal,  absence of murmurs, rubs or gallops. Peripheral vasculature: Radial pulses: normal Dorsal pedis pulses: normal Posterior pulses: normal  ABDOMEN:  Appearance: scarred from the recent surgeries.mild tenderness at the scar areas. Benign, no organomegaly, no masses, no Abdominal Aortic enlargement. No Guarding , no rebound. No Bruits. Bowel  sounds: normal  RECTAL: N/A GU: N/A  EXTREMETIES: nonedematous. Both Femoral and Pedal pulses are normal.  MUSCULOSKELETAL:  Spine:abnormal.reduced ROM with pain Joints: intact  NEUROLOGIC: oriented to time,place and person; nonfocal. Strength is normal Sensory is normal Reflexes are normal Cranial Nerves are normal.  Psychiatry Still grieving over the loss of her husband to cancer. Not hallucinating. Coping well.   Results for orders placed in visit on 12/16/12  URINE CULTURE      Result Value Range   Colony Count NO GROWTH     Organism ID, Bacteria NO GROWTH    TSH      Result Value Range   TSH 1.193  0.350 - 4.500 uIU/mL  FOLATE      Result Value Range   Folate >20.0    VITAMIN B12      Result Value Range   Vitamin B-12 376  211 - 911 pg/mL  VITAMIN D 25 HYDROXY      Result Value Range   Vit D, 25-Hydroxy 43  30 - 89 ng/mL  ARTHRITIS PANEL      Result Value Range   Sed Rate 4  0 - 22 mm/hr   Uric Acid, Serum 3.5  2.4 - 6.0 mg/dL   Rheumatoid Factor <16  <=14 IU/mL   ANA NEG  NEGATIVE    ASSESSMENT: Chronic pain syndrome - Plan: HYDROcodone-acetaminophen (NORCO/VICODIN) 5-325 MG per tablet  HTN (hypertension) - Plan: Olmesartan-Amlodipine-HCTZ (TRIBENZOR) 40-10-12.5 MG TABS  Adjustment disorder with mixed anxiety and depressed mood - Plan: escitalopram  (LEXAPRO) 10 MG tablet  Insomnia - Plan: zolpidem (AMBIEN CR) 12.5 MG CR tablet, DISCONTINUED: zolpidem (AMBIEN) 5 MG tablet  PLAN: Meds ordered this encounter  Medications  . escitalopram (LEXAPRO) 10 MG tablet    Sig: Take 1 tablet (10 mg total) by mouth daily.    Dispense:  30 tablet    Refill:  1  . Olmesartan-Amlodipine-HCTZ (TRIBENZOR) 40-10-12.5 MG TABS    Sig: Take 1 tablet by mouth every morning.    Dispense:  30 tablet    Refill:  11  . HYDROcodone-acetaminophen (NORCO/VICODIN) 5-325 MG per tablet    Sig: Take 1 tablet by mouth every 8 (eight) hours as needed. For pain    Dispense:  60 tablet    Refill:  0  . DISCONTD: zolpidem (AMBIEN) 5 MG tablet    Sig: Take 1 tablet (5 mg total) by mouth at bedtime as needed for sleep.    Dispense:  30 tablet    Refill:  0  . zolpidem (AMBIEN CR) 12.5 MG CR tablet    Sig: Take 1 tablet (12.5 mg total) by mouth at bedtime as needed for sleep.    Dispense:  30 tablet    Refill:  0   Discussed with patient the risk of pain meds that she has had withperforation of the colon and emergency surgery. The risks of addiction/dependence and need to wean down off of meds.  Counseled. Grief counseling to be continued through hospice.  Supportive therapy. Finding future purpose in life.  Return in about 6 weeks (around 02/21/2013) for Recheck medical problems.  Manjot Hinks P. [place] Charon, M.D.

## 2013-01-20 ENCOUNTER — Telehealth: Payer: Self-pay | Admitting: Family Medicine

## 2013-01-20 NOTE — Telephone Encounter (Signed)
Dr Mod[place] Charon , did you decrease dose ? Not in chart

## 2013-01-21 ENCOUNTER — Other Ambulatory Visit: Payer: Self-pay | Admitting: Family Medicine

## 2013-01-21 DIAGNOSIS — I10 Essential (primary) hypertension: Secondary | ICD-10-CM

## 2013-01-21 MED ORDER — OLMESARTAN-AMLODIPINE-HCTZ 40-10-12.5 MG PO TABS: 1.0000 | ORAL_TABLET | Freq: Every morning | ORAL | Status: DC

## 2013-01-21 NOTE — Telephone Encounter (Signed)
Yes Tribenzor 40/10/12.5 due to an episode of low K= in April. Thanks.

## 2013-01-21 NOTE — Telephone Encounter (Signed)
Pt aware of reason for decrease in strength and will follow provider's instructions.

## 2013-01-21 NOTE — Telephone Encounter (Signed)
Spoke with pt concerned about strength of tribenzor was changed at visit  Note in paper chart indicate dec 2013  Shows tribenzor  40/10/25  Please advise

## 2013-02-07 IMAGING — CT CT ABD-PELV W/O CM
2 of 4 series · 17 of 46 positions shown, 19 images · non-contrast
Comparison: 10/13/2011

CLINICAL DATA: Postop from partial colectomy and colostomy due to
colon perforation and peritonitis.  Continued abdominal pain and
low grade fever.

CT ABDOMEN AND PELVIS WITHOUT CONTRAST
TECHNIQUE: Multidetector CT imaging of the abdomen and pelvis was
performed following the standard protocol without intravenous
contrast.

[Series 2: rtn a/p w/o · axial · non-contrast · 0.76mm/px · z∈[-561,-126]mm · 14 of 95 slices shown, 16 images]
[im 4/95  soft-tissue]
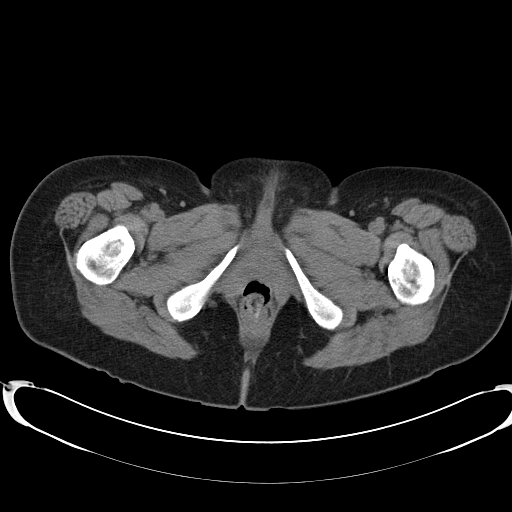
[im 4/95  bone]
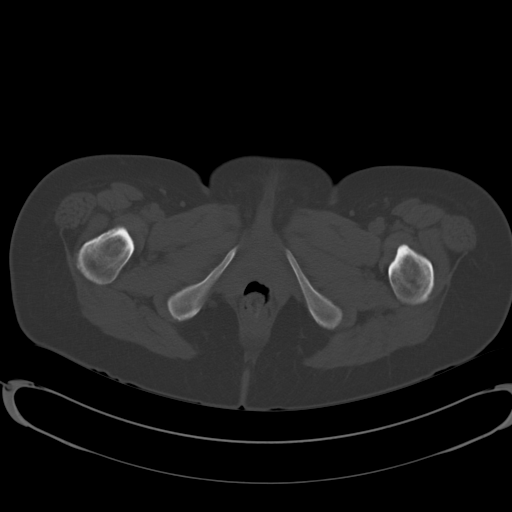
[im 12/95  soft-tissue]
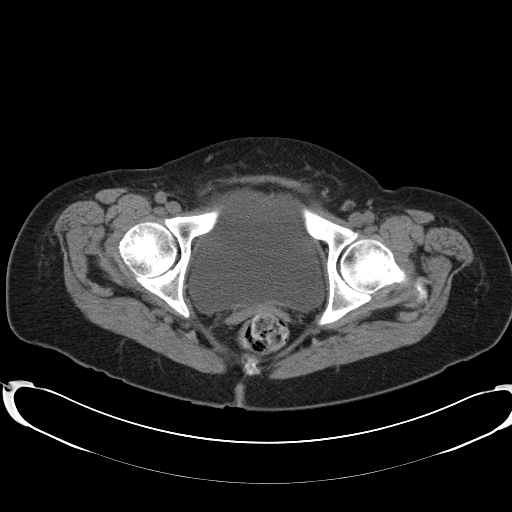
[im 19/95  soft-tissue]
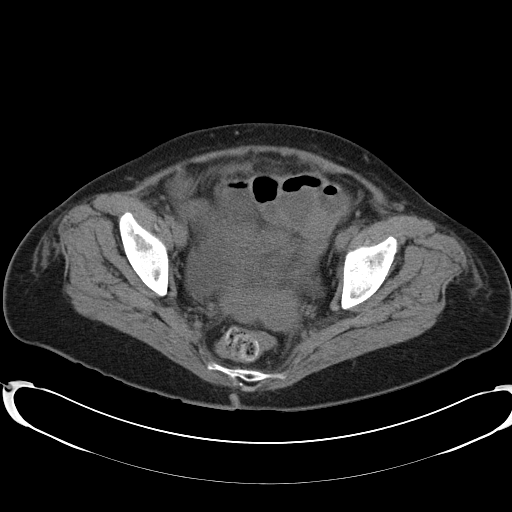
[im 27/95  soft-tissue]
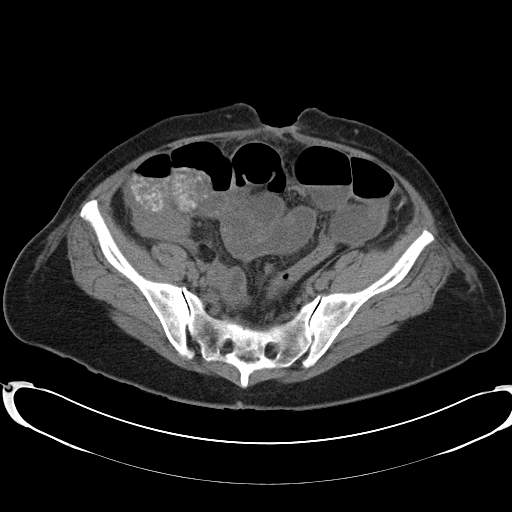
[im 31/95  soft-tissue]
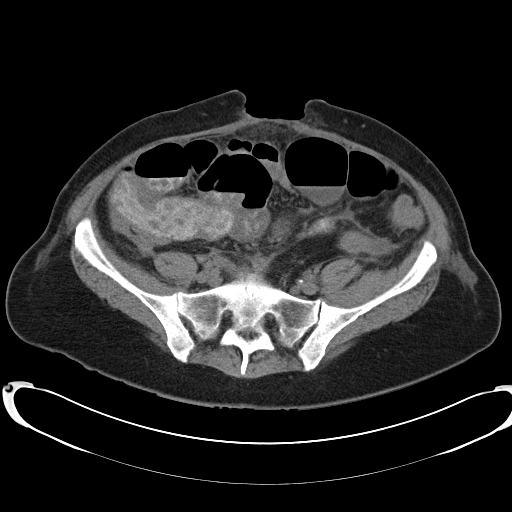
[im 38/95  soft-tissue]
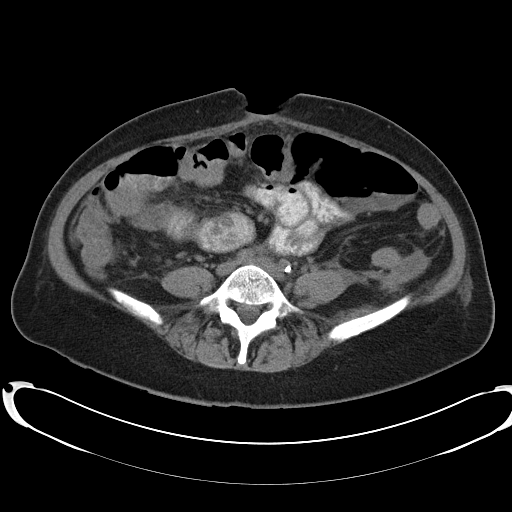
[im 46/95  soft-tissue]
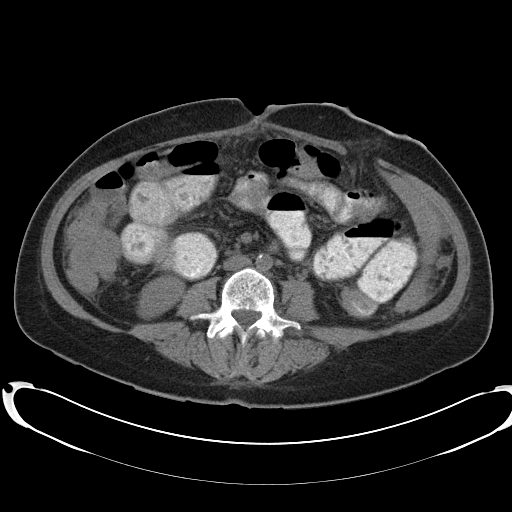
[im 49/95  soft-tissue]
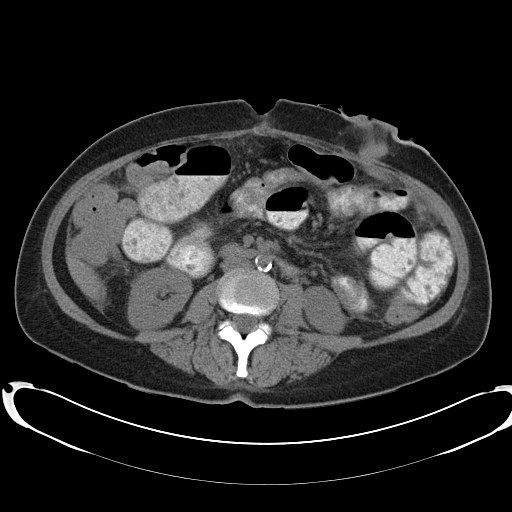
[im 57/95  soft-tissue]
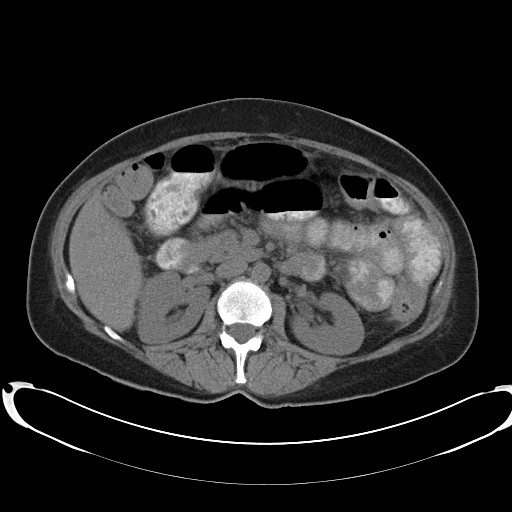
[im 57/95  bone]
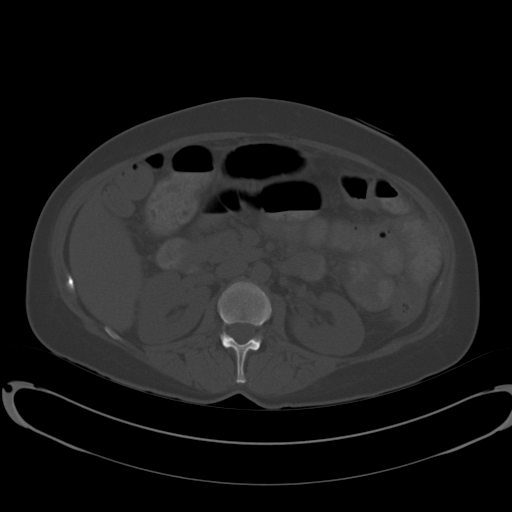
[im 64/95  soft-tissue]
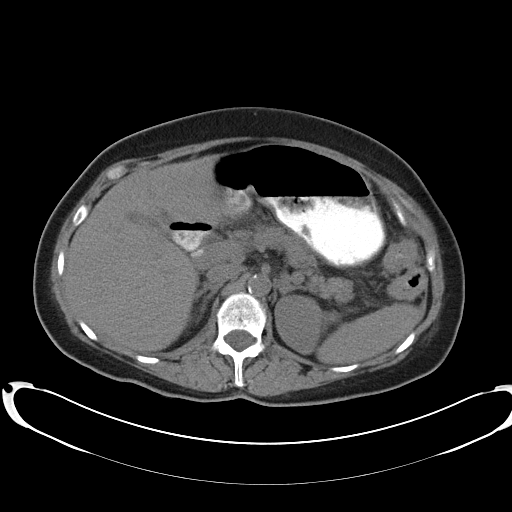
[im 72/95  soft-tissue]
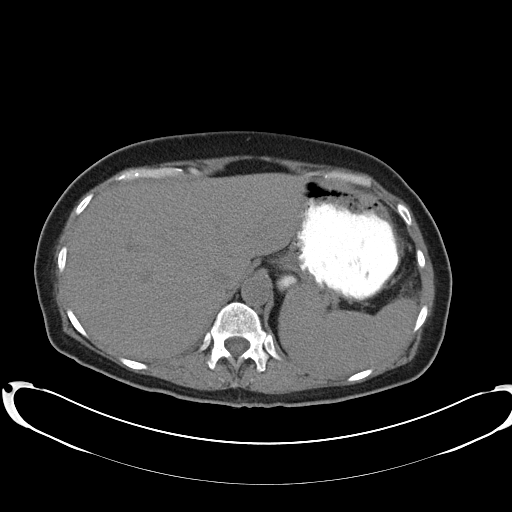
[im 76/95  soft-tissue]
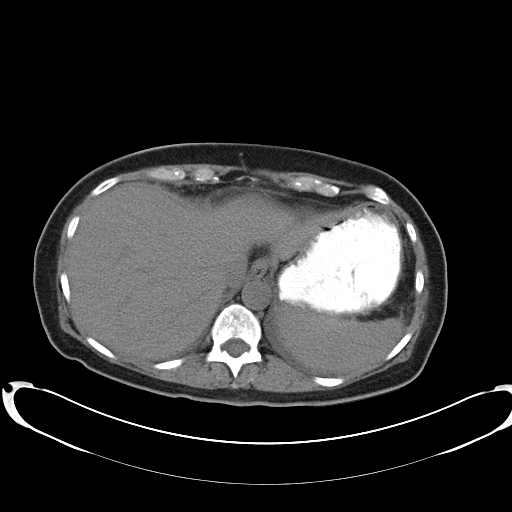
[im 83/95  soft-tissue]
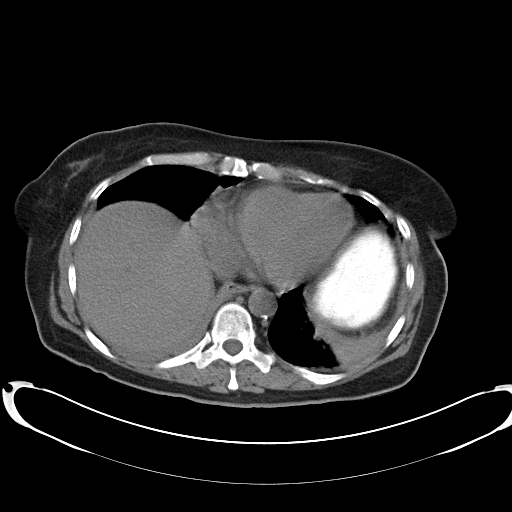
[im 91/95  soft-tissue]
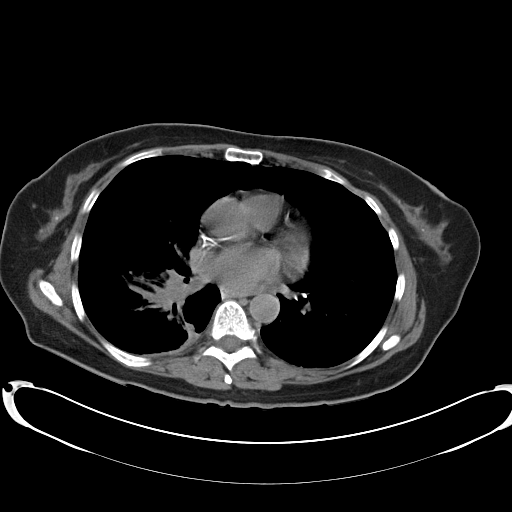

[Series 602: <mpr thick range> · coronal · 0.92mm/px · 3 of 79 slices shown]
[im 27/79  soft-tissue]
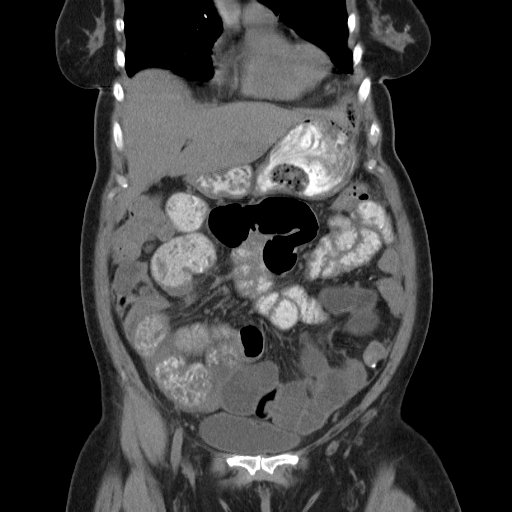
[im 35/79  soft-tissue]
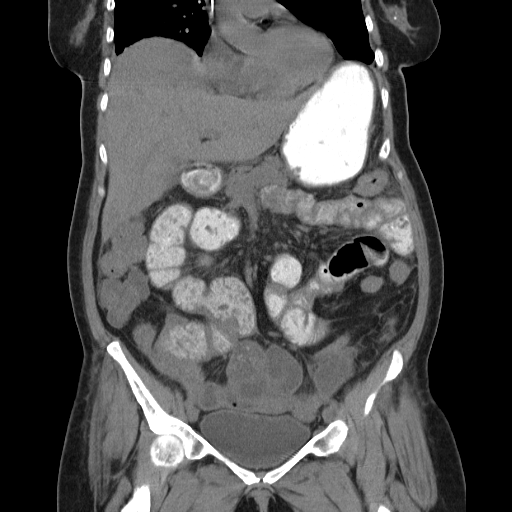
[im 44/79  soft-tissue]
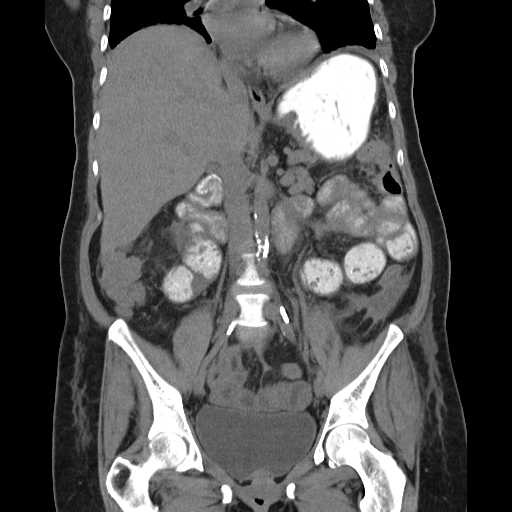

[17 of 46 positions shown; findings below may reference images not displayed]

FINDINGS: Decreased bibasilar atelectasis is seen since previous
study with resolution of small bilateral pleural effusions.

There is also decreased ascites since previous study.  Open midline
ventral abdominal wall surgical incision is noted as well as left
lower quadrant colostomy.  A small residual fluid collection is
seen in the left pericolic gutter measuring 2.0 x 3.7 cm.  This may
represent residual postop fluid although a small abscess cannot
definitely be excluded.  No other residual fluid collections are
seen.  No evidence of dilated bowel loops. There has been
resolution of small bowel and colonic wall thickening since
previous study.  Persistent mesenteric soft tissue stranding noted.

The abdominal parenchymal organs have a normal appearance on this
noncontrast study.  No evidence of hydronephrosis.  No soft tissue
masses or lymphadenopathy identified.
IMPRESSION: 1.  Decreased ascites, small bilateral pleural effusions, and
bibasilar atelectasis.
2.  Persistent 2 x 4 cm fluid collection within the left pericolic
gutter, which may represent residual postop fluid although a small
abscess cannot be excluded.

## 2013-02-10 ENCOUNTER — Other Ambulatory Visit: Payer: Self-pay | Admitting: Family Medicine

## 2013-02-11 NOTE — Telephone Encounter (Signed)
Last seen and filled on 01/10/13. If approved call into [facility] [place] [facility])

## 2013-02-13 NOTE — Telephone Encounter (Signed)
Pt aware rx called in by kristin [facility]

## 2013-02-18 ENCOUNTER — Ambulatory Visit (INDEPENDENT_AMBULATORY_CARE_PROVIDER_SITE_OTHER): Admitting: Family Medicine

## 2013-02-18 ENCOUNTER — Encounter: Payer: Self-pay | Admitting: Family Medicine

## 2013-02-18 VITALS — BP 154/78 | HR 70 | Temp 98.1°F | Wt 123.8 lb

## 2013-02-18 DIAGNOSIS — F3289 Other specified depressive episodes: Secondary | ICD-10-CM

## 2013-02-18 DIAGNOSIS — F329 Major depressive disorder, single episode, unspecified: Secondary | ICD-10-CM

## 2013-02-18 DIAGNOSIS — G43909 Migraine, unspecified, not intractable, without status migrainosus: Secondary | ICD-10-CM

## 2013-02-18 DIAGNOSIS — G8929 Other chronic pain: Secondary | ICD-10-CM

## 2013-02-18 DIAGNOSIS — E039 Hypothyroidism, unspecified: Secondary | ICD-10-CM

## 2013-02-18 DIAGNOSIS — F32A Depression, unspecified: Secondary | ICD-10-CM

## 2013-02-18 DIAGNOSIS — I10 Essential (primary) hypertension: Secondary | ICD-10-CM

## 2013-02-18 DIAGNOSIS — M549 Dorsalgia, unspecified: Secondary | ICD-10-CM

## 2013-02-18 DIAGNOSIS — G894 Chronic pain syndrome: Secondary | ICD-10-CM

## 2013-02-18 DIAGNOSIS — F4323 Adjustment disorder with mixed anxiety and depressed mood: Secondary | ICD-10-CM

## 2013-02-18 DIAGNOSIS — E785 Hyperlipidemia, unspecified: Secondary | ICD-10-CM

## 2013-02-18 MED ORDER — ESCITALOPRAM OXALATE 20 MG PO TABS: 20.0000 mg | ORAL_TABLET | Freq: Every day | ORAL | Status: DC

## 2013-02-18 MED ORDER — HYDROCODONE-ACETAMINOPHEN 5-325 MG PO TABS: 1.0000 | ORAL_TABLET | Freq: Three times a day (TID) | ORAL | Status: DC | PRN

## 2013-02-18 NOTE — Progress Notes (Signed)
Patient ID: Stacy Dalton, female   DOB: Apr 30, 1951, 62 y.o.   MRN: [id number] SUBJECTIVE: CC: Chief Complaint  Patient presents with  . Follow-up    6 week follow up med wants refill on all meds     HPI: Getting more anxious.family problems, still grieving from the death of her husband from cancer. Chronic pain controlled with present medication. Depression is lifting No constipation.  Past Medical History  Diagnosis Date  . Hypertension   . Hypothyroidism   . Depression   . COPD (chronic obstructive pulmonary disease)     told has copd, no current inhaler use  . GERD (gastroesophageal reflux disease)   . Migraine   . Anxiety     panic attacks  . Migraine   . Insomnia    Past Surgical History  Procedure Laterality Date  . Bladder tac    . Laparotomy  10/04/2011, colostomy also    Procedure: EXPLORATORY LAPAROTOMY;  Surgeon: Ernestene Mention, MD;  Location: [facility];  Service: General;  Laterality: N/A;  left partial colectomy with colostomy  . Tubal ligation    . Laparotomy  04/19/2012    Procedure: EXPLORATORY LAPAROTOMY;  Surgeon: Ernestene Mention, MD;  Location: [facility];  Service: General;  Laterality: N/A;  . Colostomy closure  04/19/2012    Procedure: COLOSTOMY CLOSURE;  Surgeon: Ernestene Mention, MD;  Location: [facility];  Service: General;  Laterality: N/A;  Laparotomy, Resection and Closure of Colostomy  . Ventral hernia repair  04/19/2012    Procedure: HERNIA REPAIR VENTRAL ADULT;  Surgeon: Ernestene Mention, MD;  Location: [facility];  Service: General;  Laterality: N/A;  . Abdominal hysterectomy    . Colon surgery     History   Social History  . Marital Status: Widowed    Spouse Name: N/A    Number of Children: N/A  . Years of Education: N/A   Occupational History  . Not on file.   Social History Main Topics  . Smoking status: Former Smoker -- 1.50 packs/day for 20 years    Types: Cigarettes    Quit date: 10/03/1997  . Smokeless tobacco: Never Used  .  Alcohol Use: No  . Drug Use: No  . Sexual Activity: Not on file   Other Topics Concern  . Not on file   Social History Narrative   married   Family History  Problem Relation Age of Onset  . Heart disease Mother   . Cancer Sister   . Diabetes Brother   . Cancer Sister   . Diabetes Sister   . Heart disease Sister   . Cancer Other     GE junction adenocarcinoma   Current Outpatient Prescriptions on File Prior to Visit  Medication Sig Dispense Refill  . Aspirin-Salicylamide-Caffeine (BC HEADACHE POWDER PO) Take 1 packet by mouth every 6 (six) hours as needed. For aches and pains      . docusate sodium (COLACE) 100 MG capsule Take 100 mg by mouth at bedtime.      [name] escitalopram (LEXAPRO) 10 MG tablet Take 1 tablet (10 mg total) by mouth daily.  30 tablet  1  . Fenofibrate 120 MG TABS Take 1 tablet (120 mg total) by mouth daily.  30 tablet  5  . HYDROcodone-acetaminophen (NORCO/VICODIN) 5-325 MG per tablet Take 1 tablet by mouth every 8 (eight) hours as needed. For pain  60 tablet  0  . levothyroxine (SYNTHROID, LEVOTHROID) 75 MCG tablet Take 75 mcg by mouth  every morning.       . Olmesartan-Amlodipine-HCTZ (TRIBENZOR) 40-10-12.5 MG TABS Take 1 tablet by mouth every morning.  30 tablet  11  . zolpidem (AMBIEN CR) 12.5 MG CR tablet TAKE 1 TABLET BY MOUTH AT BEDTIME AS NEEDED FOR SLEEP  30 tablet  0   No current facility-administered medications on file prior to visit.   Allergies  Allergen Reactions  . Toradol [Ketorolac Tromethamine] Shortness Of Breath  . Demerol Swelling  . Esgic [Butalbital-Apap-Caffeine]     jittery  . Iodine Other (See Comments)    bp bottomed out per pt several hours later; unsure if pre medicated in past with cm; done in [place]   Immunization History  Administered Date(s) Administered  . Influenza Whole 04/29/2012   Prior to Admission medications   Medication Sig Start Date End Date Taking? Authorizing Provider  Aspirin-Salicylamide-Caffeine (BC  HEADACHE POWDER PO) Take 1 packet by mouth every 6 (six) hours as needed. For aches and pains    Historical Provider, MD  docusate sodium (COLACE) 100 MG capsule Take 100 mg by mouth at bedtime.    Historical Provider, MD  escitalopram (LEXAPRO) 10 MG tablet Take 1 tablet (10 mg total) by mouth daily. 01/10/13   Ileana Ladd, MD  Fenofibrate 120 MG TABS Take 1 tablet (120 mg total) by mouth daily. 11/18/12   Ileana Ladd, MD  HYDROcodone-acetaminophen (NORCO/VICODIN) 5-325 MG per tablet Take 1 tablet by mouth every 8 (eight) hours as needed. For pain 01/10/13   Ileana Ladd, MD  levothyroxine (SYNTHROID, LEVOTHROID) 75 MCG tablet Take 75 mcg by mouth every morning.     Historical Provider, MD  Olmesartan-Amlodipine-HCTZ [facility]) 40-10-12.5 MG TABS Take 1 tablet by mouth every morning. 01/21/13   Ileana Ladd, MD  zolpidem (AMBIEN CR) 12.5 MG CR tablet TAKE 1 TABLET BY MOUTH AT BEDTIME AS NEEDED FOR SLEEP 02/10/13   Ileana Ladd, MD     ROS: As above in the HPI. All other systems are stable or negative.  OBJECTIVE: APPEARANCE:  Patient in no acute distress.The patient appeared well nourished and normally developed. Acyanotic. Waist: VITAL SIGNS:BP 154/78  Pulse 70  Temp(Src) 98.1 F (36.7 C) (Oral)  Wt 123 lb 12.8 oz (56.155 kg)  BMI 23.4 kg/m2 WF 148/80 Anxious and a little stressed  SKIN: warm and  Dry without overt rashes, tattoos and scars  HEAD and Neck: without JVD, Head and scalp: normal Eyes:No scleral icterus. Fundi normal, eye movements normal. Ears: Auricle normal, canal normal, Tympanic membranes normal, insufflation normal. Nose: normal Throat: normal Neck & thyroid: normal  CHEST & LUNGS: Chest wall: normal Lungs: Clear  CVS: Reveals the PMI to be normally located. Regular rhythm, First and Second Heart sounds are normal,  absence of murmurs, rubs or gallops. Peripheral vasculature: Radial pulses: normal Dorsal pedis pulses: normal Posterior  pulses: normal  ABDOMEN:  Appearance: normal Benign, no organomegaly, no masses, no Abdominal Aortic enlargement. No Guarding , no rebound. No Bruits. Bowel sounds: normal  RECTAL: N/A GU: N/A  EXTREMETIES: nonedematous.  MUSCULOSKELETAL:  Spine: normal Joints: intact  NEUROLOGIC: oriented to time,place and person; nonfocal. Strength is normal Sensory is normal Reflexes are normal Cranial Nerves are normal.  ASSESSMENT: Chronic pain syndrome - Plan: HYDROcodone-acetaminophen (NORCO/VICODIN) 5-325 MG per tablet  Adjustment disorder with mixed anxiety and depressed mood - Plan: escitalopram (LEXAPRO) 20 MG tablet  Chronic back pain  Depression  HLD (hyperlipidemia) - Plan: CMP14+EGFR, NMR, lipoprofile  Hypertension -  Plan: CMP14+EGFR  Migraine  Hypothyroidism - Plan: TSH   PLAN: Orders Placed This Encounter  Procedures  . CMP14+EGFR  . NMR, lipoprofile  . TSH    Meds ordered this encounter  Medications  . HYDROcodone-acetaminophen (NORCO/VICODIN) 5-325 MG per tablet    Sig: Take 1 tablet by mouth every 8 (eight) hours as needed. For pain    Dispense:  60 tablet    Refill:  0  . escitalopram (LEXAPRO) 20 MG tablet    Sig: Take 1 tablet (20 mg total) by mouth daily.    Dispense:  30 tablet    Refill:  5    Results for orders placed in visit on 12/16/12  URINE CULTURE      Result Value Range   Colony Count NO GROWTH     Organism ID, Bacteria NO GROWTH    TSH      Result Value Range   TSH 1.193  0.350 - 4.500 uIU/mL  FOLATE      Result Value Range   Folate >20.0    VITAMIN B12      Result Value Range   Vitamin B-12 376  211 - 911 pg/mL  VITAMIN D 25 HYDROXY      Result Value Range   Vit D, 25-Hydroxy 43  30 - 89 ng/mL  ARTHRITIS PANEL      Result Value Range   Sed Rate 4  0 - 22 mm/hr   Uric Acid, Serum 3.5  2.4 - 6.0 mg/dL   Rheumatoid Factor <16  <=14 IU/mL   ANA NEG  NEGATIVE    Return in about 3 months (around 05/21/2013) for Recheck  medical problems.  Ebb Carelock P. [place] Charon, M.D.

## 2013-02-19 ENCOUNTER — Other Ambulatory Visit: Payer: Self-pay | Admitting: Family Medicine

## 2013-02-19 LAB — TSH: TSH: 1.78 u[IU]/mL (ref 0.450–4.500)

## 2013-02-19 LAB — CMP14+EGFR
ALT: 41 IU/L — ABNORMAL HIGH (ref 0–32)
AST: 39 IU/L (ref 0–40)
Albumin/Globulin Ratio: 2.3 (ref 1.1–2.5)
Albumin: 5 g/dL — ABNORMAL HIGH (ref 3.6–4.8)
Alkaline Phosphatase: 66 IU/L (ref 39–117)
BUN/Creatinine Ratio: 19 (ref 11–26)
BUN: 15 mg/dL (ref 8–27)
CO2: 24 mmol/L (ref 18–29)
Calcium: 11.1 mg/dL — ABNORMAL HIGH (ref 8.6–10.2)
Chloride: 101 mmol/L (ref 97–108)
Creatinine, Ser: 0.81 mg/dL (ref 0.57–1.00)
GFR calc Af Amer: 89 mL/min/{1.73_m2} (ref >59)
GFR calc non Af Amer: 77 mL/min/{1.73_m2} (ref >59)
Globulin, Total: 2.2 g/dL (ref 1.5–4.5)
Glucose: 87 mg/dL (ref 65–99)
Potassium: 4.3 mmol/L (ref 3.5–5.2)
Sodium: 142 mmol/L (ref 134–144)
Total Bilirubin: 0.2 mg/dL (ref 0.0–1.2)
Total Protein: 7.2 g/dL (ref 6.0–8.5)

## 2013-02-19 LAB — NMR, LIPOPROFILE
Cholesterol: 185 mg/dL (ref <200)
HDL Cholesterol by NMR: 76 mg/dL (ref >=40)
HDL Particle Number: 46.6 umol/L (ref >=30.5)
LDL Particle Number: 1248 nmol/L — ABNORMAL HIGH (ref <1000)
LDL Size: 21.6 nm (ref >20.5)
LDLC SERPL CALC-MCNC: 92 mg/dL (ref <100)
LP-IR Score: <25 (ref <=45)
Small LDL Particle Number: 91 nmol/L (ref <=527)
Triglycerides by NMR: 84 mg/dL (ref <150)

## 2013-02-19 NOTE — Progress Notes (Signed)
Quick Note:  Labs abnormal. Calcium high. Need to repeat with a PTH. The rest was close to goal. No change in medications. ______

## 2013-02-21 ENCOUNTER — Ambulatory Visit: Admitting: Family Medicine

## 2013-02-28 ENCOUNTER — Other Ambulatory Visit (INDEPENDENT_AMBULATORY_CARE_PROVIDER_SITE_OTHER)

## 2013-02-28 NOTE — Progress Notes (Signed)
Pt came in for labs only 

## 2013-03-01 LAB — PTH, INTACT AND CALCIUM
Calcium: 10.1 mg/dL (ref 8.6–10.2)
PTH: 18 pg/mL (ref 15–65)

## 2013-03-01 NOTE — Progress Notes (Signed)
Quick Note:  Call patient. Labs normal. No change in plan. The original abnormal lab was probably a lab error. ______

## 2013-03-04 ENCOUNTER — Telehealth: Payer: Self-pay | Admitting: Family Medicine

## 2013-03-04 NOTE — Telephone Encounter (Signed)
Pt notified and lab results given  

## 2013-03-10 ENCOUNTER — Other Ambulatory Visit: Payer: Self-pay | Admitting: Family Medicine

## 2013-03-12 NOTE — Telephone Encounter (Signed)
Last seen 02/18/13  [facility]  If approved route to nurse to phone in

## 2013-03-13 ENCOUNTER — Other Ambulatory Visit: Payer: Self-pay | Admitting: Family Medicine

## 2013-03-13 NOTE — Telephone Encounter (Signed)
Rx ready for Phone in. 

## 2013-03-13 NOTE — Telephone Encounter (Signed)
rx for ambien cr 12.5 called to [facility]

## 2013-03-15 ENCOUNTER — Other Ambulatory Visit: Payer: Self-pay | Admitting: *Deleted

## 2013-03-15 DIAGNOSIS — G894 Chronic pain syndrome: Secondary | ICD-10-CM

## 2013-03-15 NOTE — Telephone Encounter (Signed)
Last seen by [place] Charon 02/18/13. Has followup appt with [place] Charon on 05/21/13. Last filled 02/18/2013. If approved please print and sign and route to nurse pool so that she can call patient to pick up rx.

## 2013-03-17 MED ORDER — HYDROCODONE-ACETAMINOPHEN 5-325 MG PO TABS: 1.0000 | ORAL_TABLET | Freq: Three times a day (TID) | ORAL | Status: DC | PRN

## 2013-03-17 NOTE — Telephone Encounter (Signed)
Rx ready for pick up. 

## 2013-03-18 ENCOUNTER — Encounter: Payer: Self-pay | Admitting: *Deleted

## 2013-03-18 NOTE — Telephone Encounter (Signed)
Yes pt did get called and she did pick up rx on sept 15,2014

## 2013-04-07 ENCOUNTER — Other Ambulatory Visit: Payer: Self-pay | Admitting: Family Medicine

## 2013-04-08 NOTE — Telephone Encounter (Signed)
Rx ready for Phone in. 

## 2013-04-08 NOTE — Telephone Encounter (Signed)
[facility] notified of refill  

## 2013-04-08 NOTE — Telephone Encounter (Signed)
Last seen 02/18/13  Stacy Dalton   If approve route to nurse to call into [facility]

## 2013-04-11 ENCOUNTER — Telehealth: Payer: Self-pay

## 2013-04-11 ENCOUNTER — Other Ambulatory Visit: Payer: Self-pay | Admitting: Family Medicine

## 2013-04-11 DIAGNOSIS — G894 Chronic pain syndrome: Secondary | ICD-10-CM

## 2013-04-11 MED ORDER — HYDROCODONE-ACETAMINOPHEN 5-325 MG PO TABS: 1.0000 | ORAL_TABLET | Freq: Three times a day (TID) | ORAL | Status: DC | PRN

## 2013-04-11 NOTE — Telephone Encounter (Signed)
Refill hydrocodone  

## 2013-04-11 NOTE — Telephone Encounter (Signed)
Rx ready for pick up. 

## 2013-04-11 NOTE — Telephone Encounter (Signed)
Pt notified of rx at front desk

## 2013-04-17 ENCOUNTER — Telehealth: Payer: Self-pay | Admitting: Family Medicine

## 2013-04-17 NOTE — Telephone Encounter (Signed)
Appt given for earlier date per pt request

## 2013-05-05 ENCOUNTER — Ambulatory Visit (INDEPENDENT_AMBULATORY_CARE_PROVIDER_SITE_OTHER): Admitting: Family Medicine

## 2013-05-05 ENCOUNTER — Encounter: Payer: Self-pay | Admitting: Family Medicine

## 2013-05-05 VITALS — BP 135/75 | HR 63 | Temp 97.7°F | Ht 61.0 in | Wt 119.8 lb

## 2013-05-05 DIAGNOSIS — G8929 Other chronic pain: Secondary | ICD-10-CM

## 2013-05-05 DIAGNOSIS — E039 Hypothyroidism, unspecified: Secondary | ICD-10-CM

## 2013-05-05 DIAGNOSIS — G894 Chronic pain syndrome: Secondary | ICD-10-CM

## 2013-05-05 DIAGNOSIS — G43909 Migraine, unspecified, not intractable, without status migrainosus: Secondary | ICD-10-CM

## 2013-05-05 DIAGNOSIS — Z23 Encounter for immunization: Secondary | ICD-10-CM

## 2013-05-05 DIAGNOSIS — G4700 Insomnia, unspecified: Secondary | ICD-10-CM

## 2013-05-05 DIAGNOSIS — Z933 Colostomy status: Secondary | ICD-10-CM

## 2013-05-05 DIAGNOSIS — I10 Essential (primary) hypertension: Secondary | ICD-10-CM

## 2013-05-05 DIAGNOSIS — M549 Dorsalgia, unspecified: Secondary | ICD-10-CM

## 2013-05-05 DIAGNOSIS — D649 Anemia, unspecified: Secondary | ICD-10-CM

## 2013-05-05 DIAGNOSIS — F4323 Adjustment disorder with mixed anxiety and depressed mood: Secondary | ICD-10-CM

## 2013-05-05 DIAGNOSIS — E785 Hyperlipidemia, unspecified: Secondary | ICD-10-CM

## 2013-05-05 MED ORDER — HYDROCODONE-ACETAMINOPHEN 5-325 MG PO TABS: 1.0000 | ORAL_TABLET | Freq: Three times a day (TID) | ORAL | Status: DC | PRN

## 2013-05-05 MED ORDER — ZOLPIDEM TARTRATE ER 12.5 MG PO TBCR: EXTENDED_RELEASE_TABLET | ORAL | Status: DC

## 2013-05-05 MED ORDER — CYCLOBENZAPRINE HCL 5 MG PO TABS: 5.0000 mg | ORAL_TABLET | Freq: Two times a day (BID) | ORAL | Status: DC | PRN

## 2013-05-05 NOTE — Patient Instructions (Signed)
We are beginning to taper off the opiods.  Back Pain, Adult Low back pain is very common. About 1 in 5 people have back pain.The cause of low back pain is rarely dangerous. The pain often gets better over time.About half of people with a sudden onset of back pain feel better in just 2 weeks. About 8 in 10 people feel better by 6 weeks.  CAUSES Some common causes of back pain include:  Strain of the muscles or ligaments supporting the spine.  Wear and tear (degeneration) of the spinal discs.  Arthritis.  Direct injury to the back. DIAGNOSIS Most of the time, the direct cause of low back pain is not known.However, back pain can be treated effectively even when the exact cause of the pain is unknown.Answering your caregiver's questions about your overall health and symptoms is one of the most accurate ways to make sure the cause of your pain is not dangerous. If your caregiver needs more information, he or she may order lab work or imaging tests (X-rays or MRIs).However, even if imaging tests show changes in your back, this usually does not require surgery. HOME CARE INSTRUCTIONS For many people, back pain returns.Since low back pain is rarely dangerous, it is often a condition that people can learn to [facility] their own.   Remain active. It is stressful on the back to sit or stand in one place. Do not sit, drive, or stand in one place for more than 30 minutes at a time. Take short walks on level surfaces as soon as pain allows.Try to increase the length of time you walk each day.  Do not stay in bed.Resting more than 1 or 2 days can delay your recovery.  Do not avoid exercise or work.Your body is made to move.It is not dangerous to be active, even though your back may hurt.Your back will likely heal faster if you return to being active before your pain is gone.  Pay attention to your body when you bend and lift. Many people have less discomfortwhen lifting if they bend their  knees, keep the load close to their bodies,and avoid twisting. Often, the most comfortable positions are those that put less stress on your recovering back.  Find a comfortable position to sleep. Use a firm mattress and lie on your side with your knees slightly bent. If you lie on your back, put a pillow under your knees.  Only take over-the-counter or prescription medicines as directed by your caregiver. Over-the-counter medicines to reduce pain and inflammation are often the most helpful.Your caregiver may prescribe muscle relaxant drugs.These medicines help dull your pain so you can more quickly return to your normal activities and healthy exercise.  Put ice on the injured area.  Put ice in a plastic bag.  Place a towel between your skin and the bag.  Leave the ice on for 15-20 minutes, 3-4 times a day for the first 2 to 3 days. After that, ice and heat may be alternated to reduce pain and spasms.  Ask your caregiver about trying back exercises and gentle massage. This may be of some benefit.  Avoid feeling anxious or stressed.Stress increases muscle tension and can worsen back pain.It is important to recognize when you are anxious or stressed and learn ways to manage it.Exercise is a great option. SEEK MEDICAL CARE IF:  You have pain that is not relieved with rest or medicine.  You have pain that does not improve in 1 week.  You have new  symptoms.  You are generally not feeling well. SEEK IMMEDIATE MEDICAL CARE IF:   You have pain that radiates from your back into your legs.  You develop new bowel or bladder control problems.  You have unusual weakness or numbness in your arms or legs.  You develop nausea or vomiting.  You develop abdominal pain.  You feel faint. Document Released: 06/19/2005 Document Revised: 12/19/2011 Document Reviewed: 11/07/2010 [facility] Patient Information 2014 [place], [place].  Hypertriglyceridemia  Diet for High blood levels of  Triglycerides Most fats in food are triglycerides. Triglycerides in your blood are stored as fat in your body. High levels of triglycerides in your blood may put you at a greater risk for heart disease and stroke.  Normal triglyceride levels are less than 150 mg/dL. Borderline high levels are 150-199 mg/dl. High levels are 200 - 499 mg/dL, and very high triglyceride levels are greater than 500 mg/dL. The decision to treat high triglycerides is generally based on the level. For people with borderline or high triglyceride levels, treatment includes weight loss and exercise. Drugs are recommended for people with very high triglyceride levels. Many people who need treatment for high triglyceride levels have metabolic syndrome. This syndrome is a collection of disorders that often include: insulin resistance, high blood pressure, blood clotting problems, high cholesterol and triglycerides. TESTING PROCEDURE FOR TRIGLYCERIDES  You should not eat 4 hours before getting your triglycerides measured. The normal range of triglycerides is between 10 and 250 milligrams per deciliter (mg/dl). Some people may have extreme levels (1000 or above), but your triglyceride level may be too high if it is above 150 mg/dl, depending on what other risk factors you have for heart disease.  People with high blood triglycerides may also have high blood cholesterol levels. If you have high blood cholesterol as well as high blood triglycerides, your risk for heart disease is probably greater than if you only had high triglycerides. High blood cholesterol is one of the main risk factors for heart disease. CHANGING YOUR DIET  Your weight can affect your blood triglyceride level. If you are more than 20% above your ideal body weight, you may be able to lower your blood triglycerides by losing weight. Eating less and exercising regularly is the best way to combat this. Fat provides more calories than any other food. The best way to lose  weight is to eat less fat. Only 30% of your total calories should come from fat. Less than 7% of your diet should come from saturated fat. A diet low in fat and saturated fat is the same as a diet to decrease blood cholesterol. By eating a diet lower in fat, you may lose weight, lower your blood cholesterol, and lower your blood triglyceride level.  Eating a diet low in fat, especially saturated fat, may also help you lower your blood triglyceride level. Ask your dietitian to help you figure how much fat you can eat based on the number of calories your caregiver has prescribed for you.  Exercise, in addition to helping with weight loss may also help lower triglyceride levels.   Alcohol can increase blood triglycerides. You may need to stop drinking alcoholic beverages.  Too much carbohydrate in your diet may also increase your blood triglycerides. Some complex carbohydrates are necessary in your diet. These may include bread, rice, potatoes, other starchy vegetables and cereals.  Reduce "simple" carbohydrates. These may include pure sugars, candy, honey, and jelly without losing other nutrients. If you have the kind of high  blood triglycerides that is affected by the amount of carbohydrates in your diet, you will need to eat less sugar and less high-sugar foods. Your caregiver can help you with this.  Adding 2-4 grams of fish oil (EPA+ DHA) may also help lower triglycerides. Speak with your caregiver before adding any supplements to your regimen. Following the Diet  Maintain your ideal weight. Your caregivers can help you with a diet. Generally, eating less food and getting more exercise will help you lose weight. Joining a weight control group may also help. Ask your caregivers for a good weight control group in your area.  Eat low-fat foods instead of high-fat foods. This can help you lose weight too.  These foods are lower in fat. Eat MORE of these:   Dried beans, peas, and lentils.  Egg  whites.  Low-fat cottage cheese.  Fish.  Lean cuts of meat, such as round, sirloin, rump, and flank (cut extra fat off meat you fix).  Whole grain breads, cereals and pasta.  Skim and nonfat dry milk.  Low-fat yogurt.  Poultry without the skin.  Cheese made with skim or part-skim milk, such as mozzarella, parmesan, farmers', ricotta, or pot cheese. These are higher fat foods. Eat LESS of these:   Whole milk and foods made from whole milk, such as American, blue, cheddar, monterey jack, and swiss cheese  High-fat meats, such as luncheon meats, sausages, knockwurst, bratwurst, hot dogs, ribs, corned beef, ground pork, and regular ground beef.  Fried foods. Limit saturated fats in your diet. Substituting unsaturated fat for saturated fat may decrease your blood triglyceride level. You will need to read package labels to know which products contain saturated fats.  These foods are high in saturated fat. Eat LESS of these:   Fried pork skins.  Whole milk.  Skin and fat from poultry.  Palm oil.  Butter.  Shortening.  Cream cheese.  Tomasa Blase.  Margarines and baked goods made from listed oils.  Vegetable shortenings.  Chitterlings.  Fat from meats.  Coconut oil.  Palm kernel oil.  Lard.  Cream.  Sour cream.  Fatback.  Coffee whiteners and non-dairy creamers made with these oils.  Cheese made from whole milk. Use unsaturated fats (both polyunsaturated and monounsaturated) moderately. Remember, even though unsaturated fats are better than saturated fats; you still want a diet low in total fat.  These foods are high in unsaturated fat:   Canola oil.  Sunflower oil.  Mayonnaise.  Almonds.  Peanuts.  Pine nuts.  Margarines made with these oils.  Safflower oil.  Olive oil.  Avocados.  Cashews.  Peanut butter.  Sunflower seeds.  Soybean oil.  Peanut oil.  Olives.  Pecans.  Walnuts.  Pumpkin seeds. Avoid sugar and other high-sugar  foods. This will decrease carbohydrates without decreasing other nutrients. Sugar in your food goes rapidly to your blood. When there is excess sugar in your blood, your liver may use it to make more triglycerides. Sugar also contains calories without other important nutrients.  Eat LESS of these:   Sugar, brown sugar, powdered sugar, jam, jelly, preserves, honey, syrup, molasses, pies, candy, cakes, cookies, frosting, pastries, colas, soft drinks, punches, fruit drinks, and regular gelatin.  Avoid alcohol. Alcohol, even more than sugar, may increase blood triglycerides. In addition, alcohol is high in calories and low in nutrients. Ask for sparkling water, or a diet soft drink instead of an alcoholic beverage. Suggestions for planning and preparing meals   Bake, broil, grill or roast meats instead  of frying.  Remove fat from meats and skin from poultry before cooking.  Add spices, herbs, lemon juice or vinegar to vegetables instead of salt, rich sauces or gravies.  Use a non-stick skillet without fat or use no-stick sprays.  Cool and refrigerate stews and broth. Then remove the hardened fat floating on the surface before serving.  Refrigerate meat drippings and skim off fat to make low-fat gravies.  Serve more fish.  Use less butter, margarine and other high-fat spreads on bread or vegetables.  Use skim or reconstituted non-fat dry milk for cooking.  Cook with low-fat cheeses.  Substitute low-fat yogurt or cottage cheese for all or part of the sour cream in recipes for sauces, dips or congealed salads.  Use half yogurt/half mayonnaise in salad recipes.  Substitute evaporated skim milk for cream. Evaporated skim milk or reconstituted non-fat dry milk can be whipped and substituted for whipped cream in certain recipes.  Choose fresh fruits for dessert instead of high-fat foods such as pies or cakes. Fruits are naturally low in fat. When Dining Out   Order low-fat appetizers such  as fruit or vegetable juice, pasta with vegetables or tomato sauce.  Select clear, rather than cream soups.  Ask that dressings and gravies be served on the side. Then use less of them.  Order foods that are baked, broiled, poached, steamed, stir-fried, or roasted.  Ask for margarine instead of butter, and use only a small amount.  Drink sparkling water, unsweetened tea or coffee, or diet soft drinks instead of alcohol or other sweet beverages. QUESTIONS AND ANSWERS ABOUT OTHER FATS IN THE BLOOD: SATURATED FAT, TRANS FAT, AND CHOLESTEROL What is trans fat? Trans fat is a type of fat that is formed when vegetable oil is hardened through a process called hydrogenation. This process helps makes foods more solid, gives them shape, and prolongs their shelf life. Trans fats are also called hydrogenated or partially hydrogenated oils.  What do saturated fat, trans fat, and cholesterol in foods have to do with heart disease? Saturated fat, trans fat, and cholesterol in the diet all raise the level of LDL "bad" cholesterol in the blood. The higher the LDL cholesterol, the greater the risk for coronary heart disease (CHD). Saturated fat and trans fat raise LDL similarly.  What foods contain saturated fat, trans fat, and cholesterol? High amounts of saturated fat are found in animal products, such as fatty cuts of meat, chicken skin, and full-fat dairy products like butter, whole milk, cream, and cheese, and in tropical vegetable oils such as palm, palm kernel, and coconut oil. Trans fat is found in some of the same foods as saturated fat, such as vegetable shortening, some margarines (especially hard or stick margarine), crackers, cookies, baked goods, fried foods, salad dressings, and other processed foods made with partially hydrogenated vegetable oils. Small amounts of trans fat also occur naturally in some animal products, such as milk products, beef, and lamb. Foods high in cholesterol include liver,  other organ meats, egg yolks, shrimp, and full-fat dairy products. How can I use the new food label to make heart-healthy food choices? Check the Nutrition Facts panel of the food label. Choose foods lower in saturated fat, trans fat, and cholesterol. For saturated fat and cholesterol, you can also use the Percent Daily Value (%DV): 5% DV or less is low, and 20% DV or more is high. (There is no %DV for trans fat.) Use the Nutrition Facts panel to choose foods low in saturated fat  and cholesterol, and if the trans fat is not listed, read the ingredients and limit products that list shortening or hydrogenated or partially hydrogenated vegetable oil, which tend to be high in trans fat. POINTS TO REMEMBER:   Discuss your risk for heart disease with your caregivers, and take steps to reduce risk factors.  Change your diet. Choose foods that are low in saturated fat, trans fat, and cholesterol.  Add exercise to your daily routine if it is not already being done. Participate in physical activity of moderate intensity, like brisk walking, for at least 30 minutes on most, and preferably all days of the week. No time? Break the 30 minutes into three, 10-minute segments during the day.  Stop smoking. If you do smoke, contact your caregiver to discuss ways in which they can help you quit.  Do not use street drugs.  Maintain a normal weight.  Maintain a healthy blood pressure.  Keep up with your blood work for checking the fats in your blood as directed by your caregiver. Document Released: 04/06/2004 Document Revised: 12/19/2011 Document Reviewed: 11/02/2008 [facility] Patient Information 2014 [place], [place].       Dr Woodroe Mode Recommendations  For nutrition information, I recommend books:  1).Eat to Live by Dr Monico Hoar. 2).Prevent and Reverse Heart Disease by Dr Suzzette Righter. 3) Dr Katherina Right Book:  Program to Reverse Diabetes  Exercise recommendations are:  If unable to walk,  then the patient can exercise in a chair 3 times a day. By flapping arms like a bird gently and raising legs outwards to the front.  If ambulatory, the patient can go for walks for 30 minutes 3 times a week. Then increase the intensity and duration as tolerated.  Goal is to try to attain exercise frequency to 5 times a week.  If applicable: Best to perform resistance exercises (machines or weights) 2 days a week and cardio type exercises 3 days per week.

## 2013-05-05 NOTE — Progress Notes (Signed)
SUBJECTIVE: CC: Chief Complaint  Patient presents with  . Follow-up    ck up and needs refills     HPI: Not much changes. Attends the grief meeting at the [employer].  Patient is here for follow up of hypertension: denies Headache;deniesChest Pain;denies weakness;denies Shortness of Breath or Orthopnea;denies Visual changes;denies palpitations;denies cough;denies pedal edema;denies symptoms of TIA or stroke; admits to Compliance with medications. denies Problems with medications.  Chronic Back pain: bad yesterday. Today with the cold weather it is bad. Weakness: none, no numbness.   Insomnia ongoing and controlled with ambien.  Patient is willing to try a muscle  Relaxant and reduce theopiod after lengthy discussion.    Past Medical History  Diagnosis Date  . Hypertension   . Depression   . COPD (chronic obstructive pulmonary disease)     told has copd, no current inhaler use  . GERD (gastroesophageal reflux disease)   . Migraine   . Anxiety     panic attacks  . Migraine   . Insomnia   . Hypothyroidism    Past Surgical History  Procedure Laterality Date  . Bladder tac    . Laparotomy  10/04/2011, colostomy also    Procedure: EXPLORATORY LAPAROTOMY;  Surgeon: Ernestene Mention, MD;  Location: [facility];  Service: General;  Laterality: N/A;  left partial colectomy with colostomy  . Tubal ligation    . Laparotomy  04/19/2012    Procedure: EXPLORATORY LAPAROTOMY;  Surgeon: Ernestene Mention, MD;  Location: [facility];  Service: General;  Laterality: N/A;  . Colostomy closure  04/19/2012    Procedure: COLOSTOMY CLOSURE;  Surgeon: Ernestene Mention, MD;  Location: [facility];  Service: General;  Laterality: N/A;  Laparotomy, Resection and Closure of Colostomy  . Ventral hernia repair  04/19/2012    Procedure: HERNIA REPAIR VENTRAL ADULT;  Surgeon: Ernestene Mention, MD;  Location: [facility];  Service: General;  Laterality: N/A;  . Abdominal hysterectomy    . Colon surgery      History   Social History  . Marital Status: Widowed    Spouse Name: N/A    Number of Children: N/A  . Years of Education: N/A   Occupational History  . Not on file.   Social History Main Topics  . Smoking status: Former Smoker -- 1.50 packs/day for 20 years    Types: Cigarettes    Quit date: 10/03/1997  . Smokeless tobacco: Never Used  . Alcohol Use: No  . Drug Use: No  . Sexual Activity: Not on file   Other Topics Concern  . Not on file   Social History Narrative   married   Family History  Problem Relation Age of Onset  . Heart disease Mother   . Cancer Sister   . Diabetes Brother   . Cancer Sister   . Diabetes Sister   . Heart disease Sister   . Cancer Other     GE junction adenocarcinoma   Current Outpatient Prescriptions on File Prior to Visit  Medication Sig Dispense Refill  . docusate sodium (COLACE) 100 MG capsule Take 100 mg by mouth at bedtime.      [name] escitalopram (LEXAPRO) 20 MG tablet Take 1 tablet (20 mg total) by mouth daily.  30 tablet  5  . Fenofibrate 120 MG TABS Take 1 tablet (120 mg total) by mouth daily.  30 tablet  5  . levothyroxine (SYNTHROID, LEVOTHROID) 75 MCG tablet Take 75 mcg by mouth every morning.       [name]  Olmesartan-Amlodipine-HCTZ (TRIBENZOR) 40-10-12.5 MG TABS Take 1 tablet by mouth every morning.  30 tablet  11   No current facility-administered medications on file prior to visit.   Allergies  Allergen Reactions  . Toradol [Ketorolac Tromethamine] Shortness Of Breath  . Demerol Swelling  . Esgic [Butalbital-Apap-Caffeine]     jittery  . Iodine Other (See Comments)    bp bottomed out per pt several hours later; unsure if pre medicated in past with cm; done in [place]   Immunization History  Administered Date(s) Administered  . Influenza Whole 04/29/2012  . Influenza,inj,Quad PF,36+ Mos 05/05/2013   Prior to Admission medications   Medication Sig Start Date End Date Taking? Authorizing Provider   Aspirin-Salicylamide-Caffeine (BC HEADACHE POWDER PO) Take 1 packet by mouth every 6 (six) hours as needed. For aches and pains    Historical Provider, MD  docusate sodium (COLACE) 100 MG capsule Take 100 mg by mouth at bedtime.    Historical Provider, MD  escitalopram (LEXAPRO) 20 MG tablet Take 1 tablet (20 mg total) by mouth daily. 02/18/13   Ileana Ladd, MD  Fenofibrate 120 MG TABS Take 1 tablet (120 mg total) by mouth daily. 11/18/12   Ileana Ladd, MD  HYDROcodone-acetaminophen (NORCO/VICODIN) 5-325 MG per tablet Take 1 tablet by mouth every 8 (eight) hours as needed. For pain 04/11/13   Ileana Ladd, MD  levothyroxine (SYNTHROID, LEVOTHROID) 75 MCG tablet Take 75 mcg by mouth every morning.     Historical Provider, MD  Olmesartan-Amlodipine-HCTZ [facility]) 40-10-12.5 MG TABS Take 1 tablet by mouth every morning. 01/21/13   Ileana Ladd, MD  zolpidem (AMBIEN CR) 12.5 MG CR tablet TAKE 1 TABLET BY MOUTH AT BEDTIME AS NEEDED FOR SLEEP 04/07/13   Ileana Ladd, MD     ROS: As above in the HPI. All other systems are stable or negative.  OBJECTIVE: APPEARANCE:  Patient in no acute distress.The patient appeared well nourished and normally developed. Acyanotic. Waist: VITAL SIGNS:BP 135/75  Pulse 63  Temp(Src) 97.7 F (36.5 C) (Oral)  Ht 5\' 1"  (1.549 m)  Wt 119 lb 12.8 oz (54.341 kg)  BMI 22.65 kg/m2 WF  SKIN: warm and  Dry without overt rashes, tattoos.  HEAD and Neck: without JVD, Head and scalp: normal Eyes:No scleral icterus. Fundi normal, eye movements normal. Ears: Auricle normal, canal normal, Tympanic membranes normal, insufflation normal. Nose: normal Throat: normal Neck & thyroid: normal  CHEST & LUNGS: Chest wall: normal Lungs: Clear  CVS: Reveals the PMI to be normally located. Regular rhythm, First and Second Heart sounds are normal,  absence of murmurs, rubs or gallops. Peripheral vasculature: Radial pulses: normal Dorsal pedis pulses:  normal Posterior pulses: normal  ABDOMEN:  Appearance: scarred abdomen. Benign, no organomegaly, no masses, no Abdominal Aortic enlargement. No Guarding , no rebound. No Bruits. Bowel sounds: normal  RECTAL: N/A GU: N/A  EXTREMETIES: nonedematous.  MUSCULOSKELETAL:  Spine: normal Joints: intact  NEUROLOGIC: oriented to time,place and person; nonfocal. Strength is normal Sensory is normal Reflexes are normal Cranial Nerves are normal.  ASSESSMENT: Adjustment disorder with mixed anxiety and depressed mood - Plan: zolpidem (AMBIEN CR) 12.5 MG CR tablet  Chronic back pain - Plan: cyclobenzaprine (FLEXERIL) 5 MG tablet  Hypothyroidism  Insomnia - Plan: zolpidem (AMBIEN CR) 12.5 MG CR tablet  Migraine  Normocytic anemia  S/P colostomy  Need for prophylactic vaccination and inoculation against influenza  HLD (hyperlipidemia)  Hypertension  Chronic pain syndrome - Plan: HYDROcodone-acetaminophen (NORCO/VICODIN) 5-325  MG per tablet, cyclobenzaprine (FLEXERIL) 5 MG tablet  PLAN: discussed at length the need to wean down off of opiods. Because her bowel prep was related to her ongoing use of an opiod for chronic pain. After long discussion I was able to get her to agree with a slow  Wean. She would like a muscle  Relaxant.  She does have ongoing insomnia. She ponders at nights and the loneliness gets to her.   Recommend regular attendance at grief counseling and not just showing up intermittently.  Supportive therapy.  Back care and information in the AVS given and  Discussed with patient.  No orders of the defined types were placed in this encounter.   Meds ordered this encounter  Medications  . HYDROcodone-acetaminophen (NORCO/VICODIN) 5-325 MG per tablet    Sig: Take 1 tablet by mouth every 8 (eight) hours as needed. For pain    Dispense:  45 tablet    Refill:  0  . cyclobenzaprine (FLEXERIL) 5 MG tablet    Sig: Take 1 tablet (5 mg total) by mouth 2 (two)  times daily as needed for muscle spasms.    Dispense:  45 tablet    Refill:  1  . zolpidem (AMBIEN CR) 12.5 MG CR tablet    Sig: TAKE 1 TABLET BY MOUTH AT BEDTIME AS NEEDED FOR SLEEP    Dispense:  30 tablet    Refill:  1   Medications Discontinued During This Encounter  Medication Reason  . Aspirin-Salicylamide-Caffeine (BC HEADACHE POWDER PO) Change in therapy  . HYDROcodone-acetaminophen (NORCO/VICODIN) 5-325 MG per tablet Reorder  . zolpidem (AMBIEN CR) 12.5 MG CR tablet Reorder   Return in about 2 months (around 07/05/2013) for Recheck medical problems. and continued weaning off the opiod.  Aleli Navedo P. [place] Charon, M.D.

## 2013-05-08 ENCOUNTER — Telehealth: Payer: Self-pay | Admitting: Family Medicine

## 2013-05-08 NOTE — Telephone Encounter (Signed)
Pt aware ambine cr 12.5mg  called to [employer] and left on voice mail

## 2013-05-13 ENCOUNTER — Telehealth: Payer: Self-pay | Admitting: Family Medicine

## 2013-05-13 MED ORDER — FENOFIBRATE 145 MG PO TABS: 145.0000 mg | ORAL_TABLET | Freq: Every day | ORAL | Status: DC

## 2013-05-13 NOTE — Telephone Encounter (Signed)
fenoglide changed to tricor

## 2013-05-13 NOTE — Telephone Encounter (Signed)
Mmm can you address for [employer]

## 2013-05-21 ENCOUNTER — Ambulatory Visit: Admitting: Family Medicine

## 2013-05-26 ENCOUNTER — Ambulatory Visit: Admitting: Family Medicine

## 2013-06-02 ENCOUNTER — Other Ambulatory Visit: Payer: Self-pay | Admitting: Family Medicine

## 2013-06-02 ENCOUNTER — Telehealth: Payer: Self-pay | Admitting: Family Medicine

## 2013-06-02 DIAGNOSIS — F4323 Adjustment disorder with mixed anxiety and depressed mood: Secondary | ICD-10-CM

## 2013-06-02 DIAGNOSIS — G894 Chronic pain syndrome: Secondary | ICD-10-CM

## 2013-06-02 MED ORDER — HYDROCODONE-ACETAMINOPHEN 5-325 MG PO TABS: 1.0000 | ORAL_TABLET | Freq: Three times a day (TID) | ORAL | Status: DC | PRN

## 2013-06-02 NOTE — Telephone Encounter (Signed)
Rx ready for pick up. 

## 2013-06-02 NOTE — Telephone Encounter (Signed)
Patient aware.

## 2013-06-16 ENCOUNTER — Telehealth: Payer: Self-pay | Admitting: Family Medicine

## 2013-06-16 NOTE — Telephone Encounter (Signed)
Talked with patient. She has a nontender nodule inside of her nose. Appt scheduled for 06/23/13

## 2013-06-23 ENCOUNTER — Ambulatory Visit (INDEPENDENT_AMBULATORY_CARE_PROVIDER_SITE_OTHER): Admitting: Family Medicine

## 2013-06-23 ENCOUNTER — Encounter: Payer: Self-pay | Admitting: Family Medicine

## 2013-06-23 VITALS — BP 146/78 | HR 74 | Temp 98.5°F | Ht 61.0 in | Wt 126.2 lb

## 2013-06-23 DIAGNOSIS — E785 Hyperlipidemia, unspecified: Secondary | ICD-10-CM

## 2013-06-23 DIAGNOSIS — J339 Nasal polyp, unspecified: Secondary | ICD-10-CM

## 2013-06-23 DIAGNOSIS — G8929 Other chronic pain: Secondary | ICD-10-CM

## 2013-06-23 DIAGNOSIS — I10 Essential (primary) hypertension: Secondary | ICD-10-CM

## 2013-06-23 DIAGNOSIS — E039 Hypothyroidism, unspecified: Secondary | ICD-10-CM

## 2013-06-23 DIAGNOSIS — M549 Dorsalgia, unspecified: Secondary | ICD-10-CM

## 2013-06-23 MED ORDER — FLUTICASONE PROPIONATE 50 MCG/ACT NA SUSP: 2.0000 | Freq: Every day | NASAL | Status: DC

## 2013-06-23 MED ORDER — TRAMADOL HCL 50 MG PO TABS: 50.0000 mg | ORAL_TABLET | Freq: Four times a day (QID) | ORAL | Status: DC | PRN

## 2013-06-23 NOTE — Progress Notes (Signed)
Patient ID: Stacy Dalton, female   DOB: 05/02/1951, 62 y.o.   MRN: [id number] SUBJECTIVE: CC: Chief Complaint  Patient presents with  . Nasal Polyps    been there for a few months     HPI: As above. Busy remodeling around the house.  Noticed the polyps when she is clipping the nasal hair. Had a sister who died of a nasal cancer. Patient had a polyp in the sinus removed in the past.  With christmas coming she is already feeling that she will be missing her husband and grieving for his passing. This will be the first Xmas without him  Also wants to wean off hydrocodone. Will try Tramadol and later come off that.   Past Medical History  Diagnosis Date  . Hypertension   . Depression   . COPD (chronic obstructive pulmonary disease)     told has copd, no current inhaler use  . GERD (gastroesophageal reflux disease)   . Migraine   . Anxiety     panic attacks  . Migraine   . Insomnia   . Hypothyroidism    Past Surgical History  Procedure Laterality Date  . Bladder tac    . Laparotomy  10/04/2011, colostomy also    Procedure: EXPLORATORY LAPAROTOMY;  Surgeon: Ernestene Mention, MD;  Location: [facility];  Service: General;  Laterality: N/A;  left partial colectomy with colostomy  . Tubal ligation    . Laparotomy  04/19/2012    Procedure: EXPLORATORY LAPAROTOMY;  Surgeon: Ernestene Mention, MD;  Location: [facility];  Service: General;  Laterality: N/A;  . Colostomy closure  04/19/2012    Procedure: COLOSTOMY CLOSURE;  Surgeon: Ernestene Mention, MD;  Location: [facility];  Service: General;  Laterality: N/A;  Laparotomy, Resection and Closure of Colostomy  . Ventral hernia repair  04/19/2012    Procedure: HERNIA REPAIR VENTRAL ADULT;  Surgeon: Ernestene Mention, MD;  Location: [facility];  Service: General;  Laterality: N/A;  . Abdominal hysterectomy    . Colon surgery     History   Social History  . Marital Status: Widowed    Spouse Name: N/A    Number of Children: N/A  . Years of  Education: N/A   Occupational History  . Not on file.   Social History Main Topics  . Smoking status: Former Smoker -- 1.50 packs/day for 20 years    Types: Cigarettes    Quit date: 10/03/1997  . Smokeless tobacco: Never Used  . Alcohol Use: No  . Drug Use: No  . Sexual Activity: Not on file   Other Topics Concern  . Not on file   Social History Narrative   married   Family History  Problem Relation Age of Onset  . Heart disease Mother   . Cancer Sister   . Diabetes Brother   . Cancer Sister   . Diabetes Sister   . Heart disease Sister   . Cancer Other     GE junction adenocarcinoma   Current Outpatient Prescriptions on File Prior to Visit  Medication Sig Dispense Refill  . cyclobenzaprine (FLEXERIL) 5 MG tablet Take 1 tablet (5 mg total) by mouth 2 (two) times daily as needed for muscle spasms.  45 tablet  1  . escitalopram (LEXAPRO) 20 MG tablet Take 1 tablet (20 mg total) by mouth daily.  30 tablet  5  . fenofibrate (TRICOR) 145 MG tablet Take 1 tablet (145 mg total) by mouth daily.  30 tablet  3  .  levothyroxine (SYNTHROID, LEVOTHROID) 75 MCG tablet Take 75 mcg by mouth every morning.       . Olmesartan-Amlodipine-HCTZ (TRIBENZOR) 40-10-12.5 MG TABS Take 1 tablet by mouth every morning.  30 tablet  11  . zolpidem (AMBIEN CR) 12.5 MG CR tablet TAKE 1 TABLET BY MOUTH AT BEDTIME AS NEEDED FOR SLEEP  30 tablet  1  . docusate sodium (COLACE) 100 MG capsule Take 100 mg by mouth at bedtime.       No current facility-administered medications on file prior to visit.   Allergies  Allergen Reactions  . Toradol [Ketorolac Tromethamine] Shortness Of Breath  . Demerol Swelling  . Esgic [Butalbital-Apap-Caffeine]     jittery  . Iodine Other (See Comments)    bp bottomed out per pt several hours later; unsure if pre medicated in past with cm; done in [place]   Immunization History  Administered Date(s) Administered  . Influenza Whole 04/29/2012  . Influenza,inj,Quad PF,36+  Mos 05/05/2013   Prior to Admission medications   Medication Sig Start Date End Date Taking? Authorizing Provider  cyclobenzaprine (FLEXERIL) 5 MG tablet Take 1 tablet (5 mg total) by mouth 2 (two) times daily as needed for muscle spasms. 05/05/13  Yes Ileana Ladd, MD  escitalopram (LEXAPRO) 20 MG tablet Take 1 tablet (20 mg total) by mouth daily. 02/18/13  Yes Ileana Ladd, MD  fenofibrate (TRICOR) 145 MG tablet Take 1 tablet (145 mg total) by mouth daily. 05/13/13  Yes Mary-Margaret Daphine Deutscher, FNP  HYDROcodone-acetaminophen (NORCO/VICODIN) 5-325 MG per tablet Take 1 tablet by mouth every 8 (eight) hours as needed. For pain 06/02/13  Yes Ileana Ladd, MD  levothyroxine (SYNTHROID, LEVOTHROID) 75 MCG tablet Take 75 mcg by mouth every morning.    Yes Historical Provider, MD  Olmesartan-Amlodipine-HCTZ [facility]) 40-10-12.5 MG TABS Take 1 tablet by mouth every morning. 01/21/13  Yes Ileana Ladd, MD  zolpidem (AMBIEN CR) 12.5 MG CR tablet TAKE 1 TABLET BY MOUTH AT BEDTIME AS NEEDED FOR SLEEP 05/05/13  Yes Ileana Ladd, MD  docusate sodium (COLACE) 100 MG capsule Take 100 mg by mouth at bedtime.    Historical Provider, MD     ROS: As above in the HPI. All other systems are stable or negative.  OBJECTIVE: APPEARANCE:  Patient in no acute distress.The patient appeared well nourished and normally developed. Acyanotic. Waist: VITAL SIGNS:BP 146/78  Pulse 74  Temp(Src) 98.5 F (36.9 C) (Oral)  Ht 5\' 1"  (1.549 m)  Wt 126 lb 3.2 oz (57.244 kg)  BMI 23.86 kg/m2  WF SKIN: warm and  Dry without overt rashes, tattoos.  HEAD and Neck: without JVD, Head and scalp: normal Eyes:No scleral icterus. Fundi normal, eye movements normal. Ears: Auricle normal, canal normal, Tympanic membranes normal, insufflation normal. Nose: nasal polyp in the left nares. Throat: normal Neck & thyroid: normal  CHEST & LUNGS: Chest wall: normal Lungs: Clear  CVS: Reveals the PMI to be normally  located. Regular rhythm, First and Second Heart sounds are normal,  absence of murmurs, rubs or gallops. Peripheral vasculature: Radial pulses: normal Dorsal pedis pulses: normal Posterior pulses: normal  ABDOMEN:  Appearance: scarred Benign, no organomegaly, no masses, no Abdominal Aortic enlargement. No Guarding , no rebound. No Bruits. Bowel sounds: normal  RECTAL: N/A GU: N/A  EXTREMETIES: nonedematous.  MUSCULOSKELETAL:  Spine: reduced ROM Joints: intact  NEUROLOGIC: oriented to time,place and person; nonfocal. Strength is normal Sensory is normal Reflexes are normal Cranial Nerves are normal. Results for  orders placed in visit on 02/28/13  PTH, INTACT AND CALCIUM      Result Value Range   Calcium 10.1  8.6 - 10.2 mg/dL   PTH 18  15 - 65 pg/mL   PTH Comment      ASSESSMENT: HLD (hyperlipidemia) - Plan: CMP14+EGFR, NMR, lipoprofile  Hypertension  Chronic back pain  Left nasal polyps - Plan: Ambulatory referral to ENT  Hypothyroid - Plan: TSH  PLAN:  Orders Placed This Encounter  Procedures  . CMP14+EGFR    Standing Status: Future     Number of Occurrences:      Standing Expiration Date: 06/23/2014    Order Specific Question:  Has the patient fasted?    Answer:  Yes  . NMR, lipoprofile    Standing Status: Future     Number of Occurrences:      Standing Expiration Date: 06/23/2014  . TSH    Standing Status: Future     Number of Occurrences:      Standing Expiration Date: 06/23/2014  . Ambulatory referral to ENT    Referral Priority:  Routine    Referral Type:  Consultation    Referral Reason:  Specialty Services Required    Requested Specialty:  Otolaryngology    Number of Visits Requested:  1   Meds ordered this encounter  Medications  . fluticasone (FLONASE) 50 MCG/ACT nasal spray    Sig: Place 2 sprays into both nostrils daily.    Dispense:  16 g    Refill:  6  . traMADol (ULTRAM) 50 MG tablet    Sig: Take 1 tablet (50 mg total) by  mouth every 6 (six) hours as needed.    Dispense:  60 tablet    Refill:  0   Medications Discontinued During This Encounter  Medication Reason  . HYDROcodone-acetaminophen (NORCO/VICODIN) 5-325 MG per tablet Discontinued by provider   Return in about 3 months (around 09/21/2013) for Recheck medical problems.  Grisel Blumenstock P. [place] Charon, M.D.

## 2013-06-24 ENCOUNTER — Other Ambulatory Visit: Payer: Self-pay | Admitting: Family Medicine

## 2013-06-24 DIAGNOSIS — R112 Nausea with vomiting, unspecified: Secondary | ICD-10-CM

## 2013-06-24 MED ORDER — ONDANSETRON HCL 4 MG PO TABS: 4.0000 mg | ORAL_TABLET | Freq: Three times a day (TID) | ORAL | Status: DC | PRN

## 2013-06-24 NOTE — Telephone Encounter (Signed)
[facility] for zofran [facility] --pt aware

## 2013-06-25 ENCOUNTER — Other Ambulatory Visit: Payer: Self-pay | Admitting: Family Medicine

## 2013-07-08 ENCOUNTER — Other Ambulatory Visit: Payer: Self-pay | Admitting: Family Medicine

## 2013-07-10 NOTE — Telephone Encounter (Signed)
Last seen 06/23/13  FPW  If approved route to nurse to call into [facility]

## 2013-07-11 ENCOUNTER — Ambulatory Visit: Admitting: Family Medicine

## 2013-07-12 NOTE — Telephone Encounter (Signed)
Rx ready for nurse to Phone in. 

## 2013-07-14 ENCOUNTER — Telehealth: Payer: Self-pay | Admitting: Family Medicine

## 2013-07-14 NOTE — Telephone Encounter (Signed)
rx for ambien called [facility] and pt aware

## 2013-07-16 ENCOUNTER — Telehealth: Payer: Self-pay | Admitting: Family Medicine

## 2013-07-17 ENCOUNTER — Other Ambulatory Visit (INDEPENDENT_AMBULATORY_CARE_PROVIDER_SITE_OTHER): Payer: Medicare Other

## 2013-07-17 DIAGNOSIS — E039 Hypothyroidism, unspecified: Secondary | ICD-10-CM

## 2013-07-17 DIAGNOSIS — E785 Hyperlipidemia, unspecified: Secondary | ICD-10-CM

## 2013-07-17 NOTE — Telephone Encounter (Signed)
Refill denied. 1 week too early

## 2013-07-19 LAB — CMP14+EGFR
ALT: 21 IU/L (ref 0–32)
AST: 28 IU/L (ref 0–40)
Albumin/Globulin Ratio: 2.4 (ref 1.1–2.5)
Albumin: 5 g/dL — ABNORMAL HIGH (ref 3.6–4.8)
Alkaline Phosphatase: 62 IU/L (ref 39–117)
BUN/Creatinine Ratio: 15 (ref 11–26)
BUN: 13 mg/dL (ref 8–27)
CO2: 26 mmol/L (ref 18–29)
Calcium: 10.3 mg/dL — ABNORMAL HIGH (ref 8.6–10.2)
Chloride: 96 mmol/L — ABNORMAL LOW (ref 97–108)
Creatinine, Ser: 0.86 mg/dL (ref 0.57–1.00)
GFR calc Af Amer: 82 mL/min/{1.73_m2} (ref >59)
GFR calc non Af Amer: 71 mL/min/{1.73_m2} (ref >59)
Globulin, Total: 2.1 g/dL (ref 1.5–4.5)
Glucose: 89 mg/dL (ref 65–99)
Potassium: 4 mmol/L (ref 3.5–5.2)
Sodium: 142 mmol/L (ref 134–144)
Total Bilirubin: 0.3 mg/dL (ref 0.0–1.2)
Total Protein: 7.1 g/dL (ref 6.0–8.5)

## 2013-07-19 LAB — NMR, LIPOPROFILE
Cholesterol: 202 mg/dL — ABNORMAL HIGH (ref <200)
HDL Cholesterol by NMR: 76 mg/dL (ref >=40)
HDL Particle Number: 45.7 umol/L (ref >=30.5)
LDL Particle Number: 1160 nmol/L — ABNORMAL HIGH (ref <1000)
LDL Size: 21.2 nm (ref >20.5)
LDLC SERPL CALC-MCNC: 110 mg/dL — ABNORMAL HIGH (ref <100)
LP-IR Score: <25 (ref <=45)
Small LDL Particle Number: 419 nmol/L (ref <=527)
Triglycerides by NMR: 78 mg/dL (ref <150)

## 2013-07-19 LAB — TSH: TSH: 2.33 u[IU]/mL (ref 0.450–4.500)

## 2013-07-21 ENCOUNTER — Telehealth: Payer: Self-pay | Admitting: Family Medicine

## 2013-07-21 ENCOUNTER — Other Ambulatory Visit: Payer: Self-pay | Admitting: Family Medicine

## 2013-07-21 DIAGNOSIS — G894 Chronic pain syndrome: Secondary | ICD-10-CM

## 2013-07-21 MED ORDER — TRAMADOL HCL 50 MG PO TABS
50.0000 mg | ORAL_TABLET | Freq: Four times a day (QID) | ORAL | Status: DC | PRN
Start: 2013-07-21 — End: 2013-08-15

## 2013-07-21 NOTE — Telephone Encounter (Signed)
Spoke with pt and was advised to cut tramadol in half per Dr Jacelyn Grip and try to take with food and not on empty stomach. Instructed pt if no this does not help to call

## 2013-08-04 ENCOUNTER — Telehealth: Payer: Self-pay | Admitting: Family Medicine

## 2013-08-04 NOTE — Telephone Encounter (Signed)
Per dr Jacelyn Grip --she can wait ti seen by ENT Pt verbalizes understanding

## 2013-08-15 ENCOUNTER — Telehealth: Payer: Self-pay | Admitting: Family Medicine

## 2013-08-15 ENCOUNTER — Other Ambulatory Visit: Payer: Self-pay | Admitting: Family Medicine

## 2013-08-15 DIAGNOSIS — G894 Chronic pain syndrome: Secondary | ICD-10-CM

## 2013-08-15 MED ORDER — TRAMADOL HCL 50 MG PO TABS: 50.0000 mg | ORAL_TABLET | Freq: Four times a day (QID) | ORAL | Status: DC | PRN

## 2013-08-15 NOTE — Telephone Encounter (Signed)
Rx ready for pick up. 

## 2013-08-15 NOTE — Telephone Encounter (Signed)
Patient aware to pick up 

## 2013-08-27 ENCOUNTER — Other Ambulatory Visit: Payer: Self-pay | Admitting: Family Medicine

## 2013-09-08 ENCOUNTER — Other Ambulatory Visit: Payer: Self-pay | Admitting: Family Medicine

## 2013-09-09 ENCOUNTER — Other Ambulatory Visit: Payer: Self-pay | Admitting: Family Medicine

## 2013-09-09 NOTE — Telephone Encounter (Signed)
RX for Ambien called into [facility] 

## 2013-09-09 NOTE — Telephone Encounter (Signed)
Last seen 06/23/13  FPW  If approved route to nurse to call into [facility] 

## 2013-09-09 NOTE — Telephone Encounter (Signed)
Rx ready for nurse to Phone in. 

## 2013-09-11 ENCOUNTER — Other Ambulatory Visit: Payer: Self-pay | Admitting: Family Medicine

## 2013-09-11 DIAGNOSIS — G894 Chronic pain syndrome: Secondary | ICD-10-CM

## 2013-09-11 MED ORDER — TRAMADOL HCL 50 MG PO TABS: 50.0000 mg | ORAL_TABLET | Freq: Four times a day (QID) | ORAL | Status: DC | PRN

## 2013-09-11 NOTE — Telephone Encounter (Signed)
Rx ready for nurse to Phone in. 

## 2013-09-23 ENCOUNTER — Ambulatory Visit (INDEPENDENT_AMBULATORY_CARE_PROVIDER_SITE_OTHER): Payer: Medicare Other

## 2013-09-23 ENCOUNTER — Encounter: Payer: Self-pay | Admitting: Family Medicine

## 2013-09-23 ENCOUNTER — Ambulatory Visit (INDEPENDENT_AMBULATORY_CARE_PROVIDER_SITE_OTHER): Payer: Medicare Other | Admitting: Family Medicine

## 2013-09-23 VITALS — BP 138/74 | HR 63 | Temp 98.7°F | Wt 120.4 lb

## 2013-09-23 DIAGNOSIS — Z933 Colostomy status: Secondary | ICD-10-CM | POA: Diagnosis not present

## 2013-09-23 DIAGNOSIS — M549 Dorsalgia, unspecified: Secondary | ICD-10-CM

## 2013-09-23 DIAGNOSIS — E039 Hypothyroidism, unspecified: Secondary | ICD-10-CM | POA: Diagnosis not present

## 2013-09-23 DIAGNOSIS — D649 Anemia, unspecified: Secondary | ICD-10-CM

## 2013-09-23 DIAGNOSIS — E785 Hyperlipidemia, unspecified: Secondary | ICD-10-CM | POA: Diagnosis not present

## 2013-09-23 DIAGNOSIS — I10 Essential (primary) hypertension: Secondary | ICD-10-CM

## 2013-09-23 DIAGNOSIS — G894 Chronic pain syndrome: Secondary | ICD-10-CM

## 2013-09-23 DIAGNOSIS — F4323 Adjustment disorder with mixed anxiety and depressed mood: Secondary | ICD-10-CM

## 2013-09-23 DIAGNOSIS — G8929 Other chronic pain: Secondary | ICD-10-CM

## 2013-09-23 DIAGNOSIS — G4700 Insomnia, unspecified: Secondary | ICD-10-CM

## 2013-09-23 DIAGNOSIS — G43909 Migraine, unspecified, not intractable, without status migrainosus: Secondary | ICD-10-CM

## 2013-09-23 MED ORDER — FENOFIBRATE 145 MG PO TABS: 145.0000 mg | ORAL_TABLET | Freq: Every day | ORAL | Status: DC

## 2013-09-23 MED ORDER — DULOXETINE HCL 60 MG PO CPEP: 60.0000 mg | ORAL_CAPSULE | Freq: Every day | ORAL | Status: DC

## 2013-09-23 MED ORDER — TRAMADOL HCL 50 MG PO TABS: 50.0000 mg | ORAL_TABLET | Freq: Four times a day (QID) | ORAL | Status: DC | PRN

## 2013-09-23 NOTE — Progress Notes (Signed)
Patient ID: Stacy Dalton, female   DOB: [birth year], 63 y.o.   MRN: [id number] SUBJECTIVE: CC: Chief Complaint  Patient presents with  . Follow-up    3 month follow up chronic problem refill fenofibrate and discuss pain med    HPI:  Migraines okay Back pain: middle of the back, even down to the hips.has to take something for it Usually has to take the tramadol daily. Patient is here for follow up of hyperlipidemia/HTN: denies Headache;denies Chest Pain;denies weakness;denies Shortness of Breath and orthopnea;denies Visual changes;denies palpitations;denies cough;denies pedal edema;denies symptoms of TIA or stroke;deniesClaudication symptoms. admits to Compliance with medications; denies Problems with medications.  Still grieving over the loss of her husband.  Past Medical History  Diagnosis Date  . Hypertension   . Depression   . COPD (chronic obstructive pulmonary disease)     told has copd, no current inhaler use  . GERD (gastroesophageal reflux disease)   . Migraine   . Anxiety     panic attacks  . Migraine   . Insomnia   . Hypothyroidism    Past Surgical History  Procedure Laterality Date  . Bladder tac    . Laparotomy  10/04/2011, colostomy also    Procedure: EXPLORATORY LAPAROTOMY;  Surgeon: Adin Hector, MD;  Location: [facility];  Service: General;  Laterality: N/A;  left partial colectomy with colostomy  . Tubal ligation    . Laparotomy  04/19/2012    Procedure: EXPLORATORY LAPAROTOMY;  Surgeon: Adin Hector, MD;  Location: [facility];  Service: General;  Laterality: N/A;  . Colostomy closure  04/19/2012    Procedure: COLOSTOMY CLOSURE;  Surgeon: Adin Hector, MD;  Location: [facility];  Service: General;  Laterality: N/A;  Laparotomy, Resection and Closure of Colostomy  . Ventral hernia repair  04/19/2012    Procedure: HERNIA REPAIR VENTRAL ADULT;  Surgeon: Adin Hector, MD;  Location: [facility];  Service: General;  Laterality: N/A;  . Abdominal hysterectomy     . Colon surgery     History   Social History  . Marital Status: Widowed    Spouse Name: N/A    Number of Children: N/A  . Years of Education: N/A   Occupational History  . Not on file.   Social History Main Topics  . Smoking status: Former Smoker -- 1.50 packs/day for 20 years    Types: Cigarettes    Quit date: 10/03/1997  . Smokeless tobacco: Never Used  . Alcohol Use: No  . Drug Use: No  . Sexual Activity: Not on file   Other Topics Concern  . Not on file   Social History Narrative   married   Family History  Problem Relation Age of Onset  . Heart disease Mother   . Cancer Sister   . Diabetes Brother   . Cancer Sister   . Diabetes Sister   . Heart disease Sister   . Cancer Other     GE junction adenocarcinoma   Current Outpatient Prescriptions on File Prior to Visit  Medication Sig Dispense Refill  . cyclobenzaprine (FLEXERIL) 5 MG tablet Take 1 tablet (5 mg total) by mouth 2 (two) times daily as needed for muscle spasms.  45 tablet  1  . docusate sodium (COLACE) 100 MG capsule Take 100 mg by mouth at bedtime.      . fluticasone (FLONASE) 50 MCG/ACT nasal spray Place 2 sprays into both nostrils daily.  16 g  6  . levothyroxine (SYNTHROID, LEVOTHROID) 75 MCG  tablet Take 75 mcg by mouth every morning.       . Olmesartan-Amlodipine-HCTZ (TRIBENZOR) 40-10-12.5 MG TABS Take 1 tablet by mouth every morning.  30 tablet  11  . zolpidem (AMBIEN CR) 12.5 MG CR tablet TAKE 1 TABLET DAILY AT BEDTIME AS NEEDED  30 tablet  1   No current facility-administered medications on file prior to visit.   Allergies  Allergen Reactions  . Toradol [Ketorolac Tromethamine] Shortness Of Breath  . Demerol Swelling  . Esgic [Butalbital-Apap-Caffeine]     jittery  . Iodine Other (See Comments)    bp bottomed out per pt several hours later; unsure if pre medicated in past with cm; done in [place]   Immunization History  Administered Date(s) Administered  . Influenza Whole 04/29/2012   . Influenza,inj,Quad PF,36+ Mos 05/05/2013   Prior to Admission medications   Medication Sig Start Date End Date Taking? Authorizing Provider  Biotin 1 MG CAPS Take by mouth.   Yes Historical Provider, MD  Borage, Borago officinalis, (BORAGE OIL) 1000 MG CAPS Take by mouth.   Yes Historical Provider, MD  Coenzyme Q10 (CO Q 10) 100 MG CAPS Take by mouth.   Yes Historical Provider, MD  cyclobenzaprine (FLEXERIL) 5 MG tablet Take 1 tablet (5 mg total) by mouth 2 (two) times daily as needed for muscle spasms. 05/05/13  Yes Vernie Shanks, MD  docusate sodium (COLACE) 100 MG capsule Take 100 mg by mouth at bedtime.   Yes Historical Provider, MD  escitalopram (LEXAPRO) 20 MG tablet TAKE 1 TABLET (20 MG TOTAL) BY MOUTH DAILY.   Yes Vernie Shanks, MD  fenofibrate (TRICOR) 145 MG tablet Take 1 tablet (145 mg total) by mouth daily. 05/13/13  Yes Mary-Margaret Hassell Done, FNP  fluticasone (FLONASE) 50 MCG/ACT nasal spray Place 2 sprays into both nostrils daily. 06/23/13  Yes Vernie Shanks, MD  levothyroxine (SYNTHROID, LEVOTHROID) 75 MCG tablet Take 75 mcg by mouth every morning.    Yes Historical Provider, MD  Olmesartan-Amlodipine-HCTZ Jabier Gauss) 40-10-12.5 MG TABS Take 1 tablet by mouth every morning. 01/21/13  Yes Vernie Shanks, MD  Omega 3 1200 MG CAPS Take by mouth.   Yes Historical Provider, MD  ondansetron (ZOFRAN) 4 MG tablet Take 1 tablet (4 mg total) by mouth every 8 (eight) hours as needed for nausea or vomiting. 06/24/13  Yes Vernie Shanks, MD  traMADol (ULTRAM) 50 MG tablet Take 1 tablet (50 mg total) by mouth every 6 (six) hours as needed. 09/11/13  Yes Vernie Shanks, MD  zolpidem (AMBIEN CR) 12.5 MG CR tablet TAKE 1 TABLET DAILY AT BEDTIME AS NEEDED   Yes Vernie Shanks, MD     ROS: As above in the HPI. All other systems are stable or negative.  OBJECTIVE: APPEARANCE:  Patient in no acute distress.The patient appeared well nourished and normally developed. Acyanotic. Waist: VITAL  SIGNS:BP 138/74  Pulse 63  Temp(Src) 98.7 F (37.1 C) (Oral)  Wt 120 lb 6.4 oz (54.613 kg)  WF ambulatory SKIN: warm and  Dry without overt rashes, tattoos and scars  HEAD and Neck: without JVD, Head and scalp: normal Eyes:No scleral icterus. Fundi normal, eye movements normal. Ears: Auricle normal, canal normal, Tympanic membranes normal, insufflation normal. Nose: normal Throat: normal Neck & thyroid: normal  CHEST & LUNGS: Chest wall: normal Lungs: Clear  CVS: Reveals the PMI to be normally located. Regular rhythm, First and Second Heart sounds are normal,  absence of murmurs, rubs or gallops.  Peripheral vasculature: Radial pulses: normal Dorsal pedis pulses: normal Posterior pulses: normal  ABDOMEN:  Appearance: normal Benign, no organomegaly, no masses, no Abdominal Aortic enlargement. No Guarding , no rebound. No Bruits. Bowel sounds: normal  RECTAL: N/A GU: N/A  EXTREMETIES: nonedematous.  MUSCULOSKELETAL:  Spine: normal: FROM Joints: intact  NEUROLOGIC: oriented to time,place and person; nonfocal. Strength is normal Sensory is normal Reflexes are normal Cranial Nerves are normal.  ASSESSMENT: Hypertension - Plan: CMP14+EGFR  HLD (hyperlipidemia) - Plan: CMP14+EGFR, NMR, lipoprofile, fenofibrate (TRICOR) 145 MG tablet  S/P colostomy  Normocytic anemia  Migraine  Insomnia  Hypothyroidism - Plan: TSH  Chronic back pain - Plan: DG Lumbar Spine Complete, DG Thoracic Spine 2 View, traMADol (ULTRAM) 50 MG tablet, DULoxetine (CYMBALTA) 60 MG capsule, Ambulatory referral to Physical Therapy, CANCELED: DG Thoracic Spine W/Swimmers  Adjustment disorder with mixed anxiety and depressed mood  Chronic pain syndrome - Plan: traMADol (ULTRAM) 50 MG tablet, DULoxetine (CYMBALTA) 60 MG capsule, Ambulatory referral to Physical Therapy  PLAN:  Orders Placed This Encounter  Procedures  . DG Lumbar Spine Complete    Standing Status: Future     Number of  Occurrences: 1     Standing Expiration Date: 11/24/2014    Order Specific Question:  Reason for Exam (SYMPTOM  OR DIAGNOSIS REQUIRED)    Answer:  mid and low back pain    Order Specific Question:  Preferred imaging location?    Answer:  Internal  . DG Thoracic Spine 2 View    Standing Status: Future     Number of Occurrences: 1     Standing Expiration Date: 11/24/2014    Order Specific Question:  Reason for Exam (SYMPTOM  OR DIAGNOSIS REQUIRED)    Answer:  mid back pain    Order Specific Question:  Preferred imaging location?    Answer:  Internal  . CMP14+EGFR  . NMR, lipoprofile  . TSH  . Ambulatory referral to Physical Therapy    Referral Priority:  Routine    Referral Type:  Physical Medicine    Referral Reason:  Specialty Services Required    Requested Specialty:  Physical Therapy    Number of Visits Requested:  1   [facility] reading (PRIMARY) by  Dr. Modesto Charon: mild kyphosis of the thoracic spine. Very minimal degenerative changes.                                Meds ordered this encounter  Medications  . Coenzyme Q10 (CO Q 10) 100 MG CAPS    Sig: Take by mouth.  Florian Buff, Borago officinalis, (BORAGE OIL) 1000 MG CAPS    Sig: Take by mouth.  . Omega 3 1200 MG CAPS    Sig: Take by mouth.  . Biotin 1 MG CAPS    Sig: Take by mouth.  . fenofibrate (TRICOR) 145 MG tablet    Sig: Take 1 tablet (145 mg total) by mouth daily.    Dispense:  30 tablet    Refill:  5    Order Specific Question:  Supervising Provider    Answer:  Ernestina Penna [[id number]]  . traMADol (ULTRAM) 50 MG tablet    Sig: Take 1 tablet (50 mg total) by mouth every 6 (six) hours as needed.    Dispense:  60 tablet    Refill:  0  . DULoxetine (CYMBALTA) 60 MG capsule    Sig: Take 1 capsule (60 mg total)  by mouth daily.    Dispense:  30 capsule    Refill:  3  will change from lexapro to cymbalta to see if we can get better pain control.  Medications Discontinued During This Encounter  Medication Reason  .  fenofibrate (TRICOR) 145 MG tablet Reorder  . ondansetron (ZOFRAN) 4 MG tablet Completed Course  . escitalopram (LEXAPRO) 20 MG tablet Change in therapy  . traMADol (ULTRAM) 50 MG tablet Reorder   Return in about 4 weeks (around 10/21/2013) for Recheck medical problems.  Stacy Dalton P. Jacelyn Grip, M.D.

## 2013-09-23 NOTE — Patient Instructions (Signed)
Back Pain, Adult Low back pain is very common. About 1 in 5 people have back pain.The cause of low back pain is rarely dangerous. The pain often gets better over time.About half of people with a sudden onset of back pain feel better in just 2 weeks. About 8 in 10 people feel better by 6 weeks.  CAUSES Some common causes of back pain include:  Strain of the muscles or ligaments supporting the spine.  Wear and tear (degeneration) of the spinal discs.  Arthritis.  Direct injury to the back. DIAGNOSIS Most of the time, the direct cause of low back pain is not known.However, back pain can be treated effectively even when the exact cause of the pain is unknown.Answering your caregiver's questions about your overall health and symptoms is one of the most accurate ways to make sure the cause of your pain is not dangerous. If your caregiver needs more information, he or she may order lab work or imaging tests (X-rays or MRIs).However, even if imaging tests show changes in your back, this usually does not require surgery. HOME CARE INSTRUCTIONS For many people, back pain returns.Since low back pain is rarely dangerous, it is often a condition that people can learn to manageon their own.   Remain active. It is stressful on the back to sit or stand in one place. Do not sit, drive, or stand in one place for more than 30 minutes at a time. Take short walks on level surfaces as soon as pain allows.Try to increase the length of time you walk each day.  Do not stay in bed.Resting more than 1 or 2 days can delay your recovery.  Do not avoid exercise or work.Your body is made to move.It is not dangerous to be active, even though your back may hurt.Your back will likely heal faster if you return to being active before your pain is gone.  Pay attention to your body when you bend and lift. Many people have less discomfortwhen lifting if they bend their knees, keep the load close to their bodies,and  avoid twisting. Often, the most comfortable positions are those that put less stress on your recovering back.  Find a comfortable position to sleep. Use a firm mattress and lie on your side with your knees slightly bent. If you lie on your back, put a pillow under your knees.  Only take over-the-counter or prescription medicines as directed by your caregiver. Over-the-counter medicines to reduce pain and inflammation are often the most helpful.Your caregiver may prescribe muscle relaxant drugs.These medicines help dull your pain so you can more quickly return to your normal activities and healthy exercise.  Put ice on the injured area.  Put ice in a plastic bag.  Place a towel between your skin and the bag.  Leave the ice on for 15-20 minutes, 03-04 times a day for the first 2 to 3 days. After that, ice and heat may be alternated to reduce pain and spasms.  Ask your caregiver about trying back exercises and gentle massage. This may be of some benefit.  Avoid feeling anxious or stressed.Stress increases muscle tension and can worsen back pain.It is important to recognize when you are anxious or stressed and learn ways to manage it.Exercise is a great option. SEEK MEDICAL CARE IF:  You have pain that is not relieved with rest or medicine.  You have pain that does not improve in 1 week.  You have new symptoms.  You are generally not feeling well. SEEK   IMMEDIATE MEDICAL CARE IF:   You have pain that radiates from your back into your legs.  You develop new bowel or bladder control problems.  You have unusual weakness or numbness in your arms or legs.  You develop nausea or vomiting.  You develop abdominal pain.  You feel faint. Document Released: 06/19/2005 Document Revised: 12/19/2011 Document Reviewed: 11/07/2010 [employer] Patient Information 2014 [employer].  

## 2013-09-25 LAB — NMR, LIPOPROFILE
Cholesterol: 221 mg/dL — ABNORMAL HIGH (ref <200)
HDL Cholesterol by NMR: 74 mg/dL (ref >=40)
HDL Particle Number: 47.1 umol/L (ref >=30.5)
LDL Particle Number: 1543 nmol/L — ABNORMAL HIGH (ref <1000)
LDL Size: 21.4 nm (ref >20.5)
LDLC SERPL CALC-MCNC: 120 mg/dL — ABNORMAL HIGH (ref <100)
LP-IR Score: <25 (ref <=45)
Small LDL Particle Number: 114 nmol/L (ref <=527)
Triglycerides by NMR: 135 mg/dL (ref <150)

## 2013-09-25 LAB — CMP14+EGFR
ALT: 35 IU/L — ABNORMAL HIGH (ref 0–32)
AST: 34 IU/L (ref 0–40)
Albumin/Globulin Ratio: 2.2 (ref 1.1–2.5)
Albumin: 5 g/dL — ABNORMAL HIGH (ref 3.6–4.8)
Alkaline Phosphatase: 62 IU/L (ref 39–117)
BUN/Creatinine Ratio: 15 (ref 11–26)
BUN: 12 mg/dL (ref 8–27)
CO2: 22 mmol/L (ref 18–29)
Calcium: 10.6 mg/dL — ABNORMAL HIGH (ref 8.7–10.3)
Chloride: 99 mmol/L (ref 97–108)
Creatinine, Ser: 0.81 mg/dL (ref 0.57–1.00)
GFR calc Af Amer: 88 mL/min/{1.73_m2} (ref >59)
GFR calc non Af Amer: 76 mL/min/{1.73_m2} (ref >59)
Globulin, Total: 2.3 g/dL (ref 1.5–4.5)
Glucose: 105 mg/dL — ABNORMAL HIGH (ref 65–99)
Potassium: 2.9 mmol/L — ABNORMAL LOW (ref 3.5–5.2)
Sodium: 141 mmol/L (ref 134–144)
Total Bilirubin: 0.4 mg/dL (ref 0.0–1.2)
Total Protein: 7.3 g/dL (ref 6.0–8.5)

## 2013-09-25 LAB — TSH: TSH: 2.74 u[IU]/mL (ref 0.450–4.500)

## 2013-09-28 ENCOUNTER — Other Ambulatory Visit: Payer: Self-pay | Admitting: Family Medicine

## 2013-09-28 DIAGNOSIS — E876 Hypokalemia: Secondary | ICD-10-CM

## 2013-09-28 MED ORDER — POTASSIUM CHLORIDE CRYS ER 20 MEQ PO TBCR: 20.0000 meq | EXTENDED_RELEASE_TABLET | Freq: Two times a day (BID) | ORAL | Status: DC

## 2013-09-28 NOTE — Progress Notes (Signed)
Quick Note:  Call Patient Labs that are abnormal:  Potassium too low The lipids are not at goal.  Recommendations: Needs to start stat on Kdur 20 meq twice a day for 7 days .  RTC in 1 week to recheck BP and to recheck labs and discuss removing he diuretic in her BP medication.   ______

## 2013-10-06 ENCOUNTER — Ambulatory Visit (INDEPENDENT_AMBULATORY_CARE_PROVIDER_SITE_OTHER): Payer: Medicare Other | Admitting: Family Medicine

## 2013-10-06 ENCOUNTER — Encounter: Payer: Self-pay | Admitting: Family Medicine

## 2013-10-06 VITALS — BP 134/80 | HR 100 | Temp 98.7°F | Ht 61.0 in | Wt 119.2 lb

## 2013-10-06 DIAGNOSIS — E785 Hyperlipidemia, unspecified: Secondary | ICD-10-CM | POA: Diagnosis not present

## 2013-10-06 DIAGNOSIS — M549 Dorsalgia, unspecified: Secondary | ICD-10-CM

## 2013-10-06 DIAGNOSIS — I10 Essential (primary) hypertension: Secondary | ICD-10-CM

## 2013-10-06 DIAGNOSIS — Z933 Colostomy status: Secondary | ICD-10-CM | POA: Diagnosis not present

## 2013-10-06 DIAGNOSIS — G8929 Other chronic pain: Secondary | ICD-10-CM

## 2013-10-06 DIAGNOSIS — E876 Hypokalemia: Secondary | ICD-10-CM

## 2013-10-06 DIAGNOSIS — E039 Hypothyroidism, unspecified: Secondary | ICD-10-CM

## 2013-10-06 DIAGNOSIS — F4323 Adjustment disorder with mixed anxiety and depressed mood: Secondary | ICD-10-CM

## 2013-10-06 MED ORDER — AMLODIPINE-OLMESARTAN 10-40 MG PO TABS: 1.0000 | ORAL_TABLET | Freq: Every day | ORAL | Status: DC

## 2013-10-06 MED ORDER — TRIAMTERENE-HCTZ 37.5-25 MG PO CAPS: 1.0000 | ORAL_CAPSULE | Freq: Every day | ORAL | Status: DC

## 2013-10-06 NOTE — Progress Notes (Signed)
Patient ID: Stacy Dalton, female   DOB: 1951-06-30, 63 y.o.   MRN: [id number] SUBJECTIVE: CC: Chief Complaint  Patient presents with  . Follow-up    1 WK FOLLOW UP ON POTASSIUM AND ? REEVALUATE BP MEDS    HPI: Patient is here for follow up of hypertension/Hypokalemia.: denies Headache;deniesChest Pain;denies weakness;denies Shortness of Breath or Orthopnea;denies Visual changes;denies palpitations;denies cough;denies pedal edema;denies symptoms of TIA or stroke; admits to Compliance with medications. denies Problems with medications. Has been on the tribenzor for a long time and has been the only combination that has worked for her.   Past Medical History  Diagnosis Date  . Hypertension   . Depression   . COPD (chronic obstructive pulmonary disease)     told has copd, no current inhaler use  . GERD (gastroesophageal reflux disease)   . Migraine   . Anxiety     panic attacks  . Migraine   . Insomnia   . Hypothyroidism    Past Surgical History  Procedure Laterality Date  . Bladder tac    . Laparotomy  10/04/2011, colostomy also    Procedure: EXPLORATORY LAPAROTOMY;  Surgeon: Adin Hector, MD;  Location: [facility];  Service: General;  Laterality: N/A;  left partial colectomy with colostomy  . Tubal ligation    . Laparotomy  04/19/2012    Procedure: EXPLORATORY LAPAROTOMY;  Surgeon: Adin Hector, MD;  Location: [facility];  Service: General;  Laterality: N/A;  . Colostomy closure  04/19/2012    Procedure: COLOSTOMY CLOSURE;  Surgeon: Adin Hector, MD;  Location: [facility];  Service: General;  Laterality: N/A;  Laparotomy, Resection and Closure of Colostomy  . Ventral hernia repair  04/19/2012    Procedure: HERNIA REPAIR VENTRAL ADULT;  Surgeon: Adin Hector, MD;  Location: [facility];  Service: General;  Laterality: N/A;  . Abdominal hysterectomy    . Colon surgery     History   Social History  . Marital Status: Widowed    Spouse Name: N/A    Number of Children: N/A   . Years of Education: N/A   Occupational History  . Not on file.   Social History Main Topics  . Smoking status: Former Smoker -- 1.50 packs/day for 20 years    Types: Cigarettes    Quit date: 10/03/1997  . Smokeless tobacco: Never Used  . Alcohol Use: No  . Drug Use: No  . Sexual Activity: Not on file   Other Topics Concern  . Not on file   Social History Narrative   married   Family History  Problem Relation Age of Onset  . Heart disease Mother   . Cancer Sister   . Diabetes Brother   . Cancer Sister   . Diabetes Sister   . Heart disease Sister   . Cancer Other     GE junction adenocarcinoma   Current Outpatient Prescriptions on File Prior to Visit  Medication Sig Dispense Refill  . Biotin 1 MG CAPS Take by mouth.      [name], Borago officinalis, (BORAGE OIL) 1000 MG CAPS Take by mouth.      . Coenzyme Q10 (CO Q 10) 100 MG CAPS Take by mouth.      . cyclobenzaprine (FLEXERIL) 5 MG tablet Take 1 tablet (5 mg total) by mouth 2 (two) times daily as needed for muscle spasms.  45 tablet  1  . docusate sodium (COLACE) 100 MG capsule Take 100 mg by mouth at bedtime.      [name]  DULoxetine (CYMBALTA) 60 MG capsule Take 1 capsule (60 mg total) by mouth daily.  30 capsule  3  . fenofibrate (TRICOR) 145 MG tablet Take 1 tablet (145 mg total) by mouth daily.  30 tablet  5  . fluticasone (FLONASE) 50 MCG/ACT nasal spray Place 2 sprays into both nostrils daily.  16 g  6  . levothyroxine (SYNTHROID, LEVOTHROID) 75 MCG tablet Take 75 mcg by mouth every morning.       . Omega 3 1200 MG CAPS Take by mouth.      . traMADol (ULTRAM) 50 MG tablet Take 1 tablet (50 mg total) by mouth every 6 (six) hours as needed.  60 tablet  0  . zolpidem (AMBIEN CR) 12.5 MG CR tablet TAKE 1 TABLET DAILY AT BEDTIME AS NEEDED  30 tablet  1   No current facility-administered medications on file prior to visit.   Allergies  Allergen Reactions  . Toradol [Ketorolac Tromethamine] Shortness Of Breath  .  Demerol Swelling  . Esgic [Butalbital-Apap-Caffeine]     jittery  . Iodine Other (See Comments)    bp bottomed out per pt several hours later; unsure if pre medicated in past with cm; done in [place]   Immunization History  Administered Date(s) Administered  . Influenza Whole 04/29/2012  . Influenza,inj,Quad PF,36+ Mos 05/05/2013   Prior to Admission medications   Medication Sig Start Date End Date Taking? Authorizing Provider  Biotin 1 MG CAPS Take by mouth.    Historical Provider, MD  Borage, Borago officinalis, (BORAGE OIL) 1000 MG CAPS Take by mouth.    Historical Provider, MD  Coenzyme Q10 (CO Q 10) 100 MG CAPS Take by mouth.    Historical Provider, MD  cyclobenzaprine (FLEXERIL) 5 MG tablet Take 1 tablet (5 mg total) by mouth 2 (two) times daily as needed for muscle spasms. 05/05/13   Vernie Shanks, MD  docusate sodium (COLACE) 100 MG capsule Take 100 mg by mouth at bedtime.    Historical Provider, MD  DULoxetine (CYMBALTA) 60 MG capsule Take 1 capsule (60 mg total) by mouth daily. 09/23/13   Vernie Shanks, MD  fenofibrate (TRICOR) 145 MG tablet Take 1 tablet (145 mg total) by mouth daily. 09/23/13   Vernie Shanks, MD  fluticasone (FLONASE) 50 MCG/ACT nasal spray Place 2 sprays into both nostrils daily. 06/23/13   Vernie Shanks, MD  levothyroxine (SYNTHROID, LEVOTHROID) 75 MCG tablet Take 75 mcg by mouth every morning.     Historical Provider, MD  Olmesartan-Amlodipine-HCTZ Jabier Gauss) 40-10-12.5 MG TABS Take 1 tablet by mouth every morning. 01/21/13   Vernie Shanks, MD  Omega 3 1200 MG CAPS Take by mouth.    Historical Provider, MD  potassium chloride SA (K-DUR,KLOR-CON) 20 MEQ tablet Take 1 tablet (20 mEq total) by mouth 2 (two) times daily. 09/28/13   Vernie Shanks, MD  traMADol (ULTRAM) 50 MG tablet Take 1 tablet (50 mg total) by mouth every 6 (six) hours as needed. 09/23/13   Vernie Shanks, MD  zolpidem (AMBIEN CR) 12.5 MG CR tablet TAKE 1 TABLET DAILY AT BEDTIME AS NEEDED     Vernie Shanks, MD     ROS: As above in the HPI. All other systems are stable or negative.  OBJECTIVE: APPEARANCE:  Patient in no acute distress.The patient appeared well nourished and normally developed. Acyanotic. Waist: VITAL SIGNS:BP 134/80  Pulse 100  Temp(Src) 98.7 F (37.1 C) (Oral)  Ht 5' 1" (1.549 m)  Wt 119 lb 3.2 oz (54.069 kg)  BMI 22.53 kg/m2  WF  SKIN: warm and  Dry without overt rashes, tattoos and scars  HEAD and Neck: without JVD, Head and scalp: normal Eyes:No scleral icterus. Fundi normal, eye movements normal. Ears: Auricle normal, canal normal, Tympanic membranes normal, insufflation normal. Nose: normal Throat: normal Neck & thyroid: normal  CHEST & LUNGS: Chest wall: normal Lungs: Clear  CVS: Reveals the PMI to be normally located. Regular rhythm, First and Second Heart sounds are normal,  absence of murmurs, rubs or gallops. Peripheral vasculature: Radial pulses: normal Dorsal pedis pulses: normal Posterior pulses: normal  ABDOMEN:  Appearance: soft scarred from surgery Benign, no organomegaly, no masses, no Abdominal Aortic enlargement. No Guarding , no rebound. No Bruits. Bowel sounds: normal  RECTAL: N/A GU: N/A  EXTREMETIES: nonedematous.  MUSCULOSKELETAL:  Spine: normal Joints: intact  NEUROLOGIC: oriented to time,place and person; nonfocal. Results for orders placed in visit on 09/23/13  CMP14+EGFR      Result Value Ref Range   Glucose 105 (*) 65 - 99 mg/dL   BUN 12  8 - 27 mg/dL   Creatinine, Ser 0.81  0.57 - 1.00 mg/dL   GFR calc non Af Amer 76  >59 mL/min/1.73   GFR calc Af Amer 88  >59 mL/min/1.73   BUN/Creatinine Ratio 15  11 - 26   Sodium 141  134 - 144 mmol/L   Potassium 2.9 (*) 3.5 - 5.2 mmol/L   Chloride 99  97 - 108 mmol/L   CO2 22  18 - 29 mmol/L   Calcium 10.6 (*) 8.7 - 10.3 mg/dL   Total Protein 7.3  6.0 - 8.5 g/dL   Albumin 5.0 (*) 3.6 - 4.8 g/dL   Globulin, Total 2.3  1.5 - 4.5 g/dL    Albumin/Globulin Ratio 2.2  1.1 - 2.5   Total Bilirubin 0.4  0.0 - 1.2 mg/dL   Alkaline Phosphatase 62  39 - 117 IU/L   AST 34  0 - 40 IU/L   ALT 35 (*) 0 - 32 IU/L  NMR, LIPOPROFILE      Result Value Ref Range   LDL Particle Number [id number] (*) <1000 nmol/L   LDLC SERPL CALC-MCNC 120 (*) <100 mg/dL   HDL Cholesterol by NMR 74  >=40 mg/dL   Triglycerides by NMR 135  <150 mg/dL   Cholesterol 221 (*) <200 mg/dL   HDL Particle Number 47.1  >=30.5 umol/L   Small LDL Particle Number 114  <=527 nmol/L   LDL Size 21.4  >20.5 nm   LP-IR Score < 25  <= 45  TSH      Result Value Ref Range   TSH 2.740  0.450 - 4.500 uIU/mL    ASSESSMENT: Hypertension - Plan: triamterene-hydrochlorothiazide (DYAZIDE) 37.5-25 MG per capsule, amLODipine-olmesartan (AZOR) 10-40 MG per tablet  HLD (hyperlipidemia)  S/P colostomy  Chronic back pain  Hypothyroidism  Adjustment disorder with mixed anxiety and depressed mood  Hypokalemia - Plan: BMP8+EGFR  PLAN:  Orders Placed This Encounter  Procedures  . BMP8+EGFR   Meds ordered this encounter  Medications  . DISCONTD: triamterene-hydrochlorothiazide (DYAZIDE) 37.5-25 MG per capsule    Sig: Take 1 capsule by mouth daily.  [name] DISCONTD: Olmesartan-Amlodipine-HCTZ (TRIBENZOR) 40-10-12.5 MG TABS    Sig: Take 1 tablet by mouth every morning.  [name] DISCONTD: potassium chloride SA (K-DUR,KLOR-CON) 20 MEQ tablet    Sig: Take 20 mEq by mouth 2 (two) times daily.  [name] triamterene-hydrochlorothiazide (DYAZIDE) 37.5-25 MG  per capsule    Sig: Take 1 each (1 capsule total) by mouth daily.    Dispense:  30 capsule    Refill:  3  . amLODipine-olmesartan (AZOR) 10-40 MG per tablet    Sig: Take 1 tablet by mouth daily.    Dispense:  28 tablet    Refill:  0   Medications Discontinued During This Encounter  Medication Reason  . Olmesartan-Amlodipine-HCTZ (TRIBENZOR) 40-10-12.5 MG TABS Change in therapy  . potassium chloride SA (K-DUR,KLOR-CON) 20 MEQ tablet Change in  therapy  . triamterene-hydrochlorothiazide (DYAZIDE) 37.5-25 MG per capsule Entry Error  . Olmesartan-Amlodipine-HCTZ (TRIBENZOR) 40-10-12.5 MG TABS Change in therapy  . potassium chloride SA (K-DUR,KLOR-CON) 20 MEQ tablet Change in therapy   Return in about 3 weeks (around 10/24/2013) for recheck BP and potassium.   P. Jacelyn Grip, M.D.

## 2013-10-07 ENCOUNTER — Ambulatory Visit: Payer: Medicare Other | Attending: Family Medicine

## 2013-10-07 ENCOUNTER — Other Ambulatory Visit: Payer: Self-pay | Admitting: Family Medicine

## 2013-10-07 DIAGNOSIS — M545 Low back pain, unspecified: Secondary | ICD-10-CM | POA: Diagnosis not present

## 2013-10-07 DIAGNOSIS — IMO0001 Reserved for inherently not codable concepts without codable children: Secondary | ICD-10-CM | POA: Insufficient documentation

## 2013-10-07 DIAGNOSIS — Z9889 Other specified postprocedural states: Secondary | ICD-10-CM | POA: Diagnosis not present

## 2013-10-07 DIAGNOSIS — G894 Chronic pain syndrome: Secondary | ICD-10-CM | POA: Diagnosis not present

## 2013-10-07 DIAGNOSIS — M6281 Muscle weakness (generalized): Secondary | ICD-10-CM | POA: Insufficient documentation

## 2013-10-07 DIAGNOSIS — M546 Pain in thoracic spine: Secondary | ICD-10-CM | POA: Insufficient documentation

## 2013-10-07 DIAGNOSIS — E876 Hypokalemia: Secondary | ICD-10-CM

## 2013-10-07 LAB — BMP8+EGFR
BUN/Creatinine Ratio: 23 (ref 11–26)
BUN: 17 mg/dL (ref 8–27)
CO2: 22 mmol/L (ref 18–29)
Calcium: 10.4 mg/dL — ABNORMAL HIGH (ref 8.7–10.3)
Chloride: 98 mmol/L (ref 97–108)
Creatinine, Ser: 0.73 mg/dL (ref 0.57–1.00)
GFR calc Af Amer: 100 mL/min/{1.73_m2} (ref >59)
GFR calc non Af Amer: 87 mL/min/{1.73_m2} (ref >59)
Glucose: 146 mg/dL — ABNORMAL HIGH (ref 65–99)
Potassium: 3.3 mmol/L — ABNORMAL LOW (ref 3.5–5.2)
Sodium: 138 mmol/L (ref 134–144)

## 2013-10-07 NOTE — Progress Notes (Signed)
Quick Note:  Call Patient Labs that are abnormal: Potassium is better but still a little low.  Recommendations: Patient needs to take a potassium tablet daily for 3 days more to replenish her potassium.recheck potassium this Friday 4/10.. Ordered in EPIC.   ______

## 2013-10-10 ENCOUNTER — Other Ambulatory Visit (INDEPENDENT_AMBULATORY_CARE_PROVIDER_SITE_OTHER): Payer: Medicare Other

## 2013-10-10 DIAGNOSIS — E876 Hypokalemia: Secondary | ICD-10-CM | POA: Diagnosis not present

## 2013-10-10 NOTE — Progress Notes (Signed)
Pt came in for labs only 

## 2013-10-11 LAB — BMP8+EGFR
BUN/Creatinine Ratio: 28 — ABNORMAL HIGH (ref 11–26)
BUN: 19 mg/dL (ref 8–27)
CO2: 25 mmol/L (ref 18–29)
Calcium: 10.2 mg/dL (ref 8.7–10.3)
Chloride: 96 mmol/L — ABNORMAL LOW (ref 97–108)
Creatinine, Ser: 0.69 mg/dL (ref 0.57–1.00)
GFR calc Af Amer: 106 mL/min/{1.73_m2} (ref >59)
GFR calc non Af Amer: 92 mL/min/{1.73_m2} (ref >59)
Glucose: 120 mg/dL — ABNORMAL HIGH (ref 65–99)
Potassium: 3.9 mmol/L (ref 3.5–5.2)
Sodium: 137 mmol/L (ref 134–144)

## 2013-10-12 NOTE — Progress Notes (Signed)
Quick Note:  Call patient. Labs: potassium is now normal. No change in plan. ______

## 2013-10-13 ENCOUNTER — Ambulatory Visit: Payer: Medicare Other | Admitting: Family Medicine

## 2013-10-14 ENCOUNTER — Ambulatory Visit: Payer: Medicare Other | Admitting: Physical Therapy

## 2013-10-14 DIAGNOSIS — M545 Low back pain, unspecified: Secondary | ICD-10-CM | POA: Diagnosis not present

## 2013-10-14 DIAGNOSIS — M6281 Muscle weakness (generalized): Secondary | ICD-10-CM | POA: Diagnosis not present

## 2013-10-14 DIAGNOSIS — G894 Chronic pain syndrome: Secondary | ICD-10-CM | POA: Diagnosis not present

## 2013-10-14 DIAGNOSIS — IMO0001 Reserved for inherently not codable concepts without codable children: Secondary | ICD-10-CM | POA: Diagnosis not present

## 2013-10-14 DIAGNOSIS — Z9889 Other specified postprocedural states: Secondary | ICD-10-CM | POA: Diagnosis not present

## 2013-10-14 DIAGNOSIS — M546 Pain in thoracic spine: Secondary | ICD-10-CM | POA: Diagnosis not present

## 2013-10-16 ENCOUNTER — Ambulatory Visit: Payer: Medicare Other | Admitting: *Deleted

## 2013-10-16 DIAGNOSIS — M6281 Muscle weakness (generalized): Secondary | ICD-10-CM | POA: Diagnosis not present

## 2013-10-16 DIAGNOSIS — M545 Low back pain, unspecified: Secondary | ICD-10-CM | POA: Diagnosis not present

## 2013-10-16 DIAGNOSIS — Z9889 Other specified postprocedural states: Secondary | ICD-10-CM | POA: Diagnosis not present

## 2013-10-16 DIAGNOSIS — M546 Pain in thoracic spine: Secondary | ICD-10-CM | POA: Diagnosis not present

## 2013-10-16 DIAGNOSIS — G894 Chronic pain syndrome: Secondary | ICD-10-CM | POA: Diagnosis not present

## 2013-10-16 DIAGNOSIS — IMO0001 Reserved for inherently not codable concepts without codable children: Secondary | ICD-10-CM | POA: Diagnosis not present

## 2013-10-21 ENCOUNTER — Ambulatory Visit: Payer: Medicare Other | Admitting: Physical Therapy

## 2013-10-21 ENCOUNTER — Ambulatory Visit (INDEPENDENT_AMBULATORY_CARE_PROVIDER_SITE_OTHER): Payer: Medicare Other | Admitting: Family Medicine

## 2013-10-21 ENCOUNTER — Encounter: Payer: Self-pay | Admitting: Family Medicine

## 2013-10-21 VITALS — BP 133/74 | HR 78 | Temp 98.8°F | Ht 61.0 in | Wt 123.2 lb

## 2013-10-21 DIAGNOSIS — M545 Low back pain, unspecified: Secondary | ICD-10-CM | POA: Diagnosis not present

## 2013-10-21 DIAGNOSIS — M6281 Muscle weakness (generalized): Secondary | ICD-10-CM | POA: Diagnosis not present

## 2013-10-21 DIAGNOSIS — Z9889 Other specified postprocedural states: Secondary | ICD-10-CM | POA: Diagnosis not present

## 2013-10-21 DIAGNOSIS — G894 Chronic pain syndrome: Secondary | ICD-10-CM | POA: Diagnosis not present

## 2013-10-21 DIAGNOSIS — W57XXXA Bitten or stung by nonvenomous insect and other nonvenomous arthropods, initial encounter: Secondary | ICD-10-CM

## 2013-10-21 DIAGNOSIS — S80269A Insect bite (nonvenomous), unspecified knee, initial encounter: Secondary | ICD-10-CM

## 2013-10-21 DIAGNOSIS — S90569A Insect bite (nonvenomous), unspecified ankle, initial encounter: Secondary | ICD-10-CM

## 2013-10-21 DIAGNOSIS — M546 Pain in thoracic spine: Secondary | ICD-10-CM | POA: Diagnosis not present

## 2013-10-21 DIAGNOSIS — IMO0001 Reserved for inherently not codable concepts without codable children: Secondary | ICD-10-CM | POA: Diagnosis not present

## 2013-10-21 MED ORDER — DOXYCYCLINE HYCLATE 100 MG PO TABS: 100.0000 mg | ORAL_TABLET | Freq: Two times a day (BID) | ORAL | Status: DC

## 2013-10-21 NOTE — Progress Notes (Signed)
   Subjective:    Patient ID: Stacy Dalton, female    DOB: March 17, 1951, 63 y.o.   MRN: [id number]  HPI This 63 y.o. female presents for evaluation of tick bite on left hip and she is concerned because the tick head is still in her skin.   Review of Systems C/o tick bite left hip No chest pain, SOB, HA, dizziness, vision change, N/V, diarrhea, constipation, dysuria, urinary urgency or frequency, myalgias, arthralgias or rash.     Objective:   Physical Exam  Vital signs noted  Well developed well nourished female.  HEENT - Head atraumatic Normocephalic                Eyes - PERRLA, Conjuctiva - clear Sclera- Clear EOMI                Ears - EAC's Wnl TM's Wnl Gross Hearing WNL Respiratory - Lungs CTA bilateral Cardiac - RRR S1 and S2 without murmur Skin - Left thigh with approx 1/2 diameter area with dark spot in center.  Procedure - 1/2 cc of lidocaine with epi injected in wheal fashion and using needle forceps tick head Is removed and the wound is cleaned      Assessment & Plan:  Tick bite of knee - Plan: doxycycline (VIBRA-TABS) 100 MG tablet Po bid x 10 days.  Removal foreign body left hip.  [name] FNP

## 2013-10-23 ENCOUNTER — Ambulatory Visit: Payer: Medicare Other | Admitting: Physical Therapy

## 2013-10-23 DIAGNOSIS — M6281 Muscle weakness (generalized): Secondary | ICD-10-CM | POA: Diagnosis not present

## 2013-10-23 DIAGNOSIS — M545 Low back pain, unspecified: Secondary | ICD-10-CM | POA: Diagnosis not present

## 2013-10-23 DIAGNOSIS — G894 Chronic pain syndrome: Secondary | ICD-10-CM | POA: Diagnosis not present

## 2013-10-23 DIAGNOSIS — IMO0001 Reserved for inherently not codable concepts without codable children: Secondary | ICD-10-CM | POA: Diagnosis not present

## 2013-10-23 DIAGNOSIS — Z9889 Other specified postprocedural states: Secondary | ICD-10-CM | POA: Diagnosis not present

## 2013-10-23 DIAGNOSIS — M546 Pain in thoracic spine: Secondary | ICD-10-CM | POA: Diagnosis not present

## 2013-10-24 ENCOUNTER — Ambulatory Visit (INDEPENDENT_AMBULATORY_CARE_PROVIDER_SITE_OTHER): Payer: Medicare Other | Admitting: Family Medicine

## 2013-10-24 ENCOUNTER — Encounter: Payer: Self-pay | Admitting: Family Medicine

## 2013-10-24 VITALS — BP 124/80 | HR 88 | Temp 97.1°F | Ht 61.0 in | Wt 123.0 lb

## 2013-10-24 DIAGNOSIS — E785 Hyperlipidemia, unspecified: Secondary | ICD-10-CM | POA: Diagnosis not present

## 2013-10-24 DIAGNOSIS — G894 Chronic pain syndrome: Secondary | ICD-10-CM

## 2013-10-24 DIAGNOSIS — M549 Dorsalgia, unspecified: Secondary | ICD-10-CM

## 2013-10-24 DIAGNOSIS — I10 Essential (primary) hypertension: Secondary | ICD-10-CM | POA: Diagnosis not present

## 2013-10-24 DIAGNOSIS — G4700 Insomnia, unspecified: Secondary | ICD-10-CM

## 2013-10-24 DIAGNOSIS — G8929 Other chronic pain: Secondary | ICD-10-CM

## 2013-10-24 DIAGNOSIS — E876 Hypokalemia: Secondary | ICD-10-CM | POA: Diagnosis not present

## 2013-10-24 MED ORDER — TRAMADOL HCL 50 MG PO TABS: 50.0000 mg | ORAL_TABLET | Freq: Four times a day (QID) | ORAL | Status: DC | PRN

## 2013-10-24 MED ORDER — ZOLPIDEM TARTRATE ER 12.5 MG PO TBCR: EXTENDED_RELEASE_TABLET | ORAL | Status: DC

## 2013-10-24 MED ORDER — AMLODIPINE-OLMESARTAN 10-40 MG PO TABS: 1.0000 | ORAL_TABLET | Freq: Every day | ORAL | Status: DC

## 2013-10-24 NOTE — Progress Notes (Signed)
Patient ID: Stacy Dalton, female   DOB: [birth year], 63 y.o.   MRN: [id number] SUBJECTIVE: CC: Chief Complaint  Patient presents with  . Follow-up    1 MONTH FOLLOW UP  STATES SHE IS "FEELING SOME HAPPINESS"  . Medication Refill    REFILL AZOR AND TRAMADOL    HPI: Patient is here for follow up of hypertension/hypokalemia denies Headache;deniesChest Pain;denies weakness;denies Shortness of Breath or Orthopnea;denies Visual changes;denies palpitations;denies cough;denies pedal edema;denies symptoms of TIA or stroke; admits to Compliance with medications. denies Problems with medications. Emotionally better now Going through therapy  Past Medical History  Diagnosis Date  . Hypertension   . Depression   . COPD (chronic obstructive pulmonary disease)     told has copd, no current inhaler use  . GERD (gastroesophageal reflux disease)   . Migraine   . Anxiety     panic attacks  . Migraine   . Insomnia   . Hypothyroidism    Past Surgical History  Procedure Laterality Date  . Bladder tac    . Laparotomy  10/04/2011, colostomy also    Procedure: EXPLORATORY LAPAROTOMY;  Surgeon: Adin Hector, MD;  Location: [facility];  Service: General;  Laterality: N/A;  left partial colectomy with colostomy  . Tubal ligation    . Laparotomy  04/19/2012    Procedure: EXPLORATORY LAPAROTOMY;  Surgeon: Adin Hector, MD;  Location: [facility];  Service: General;  Laterality: N/A;  . Colostomy closure  04/19/2012    Procedure: COLOSTOMY CLOSURE;  Surgeon: Adin Hector, MD;  Location: [facility];  Service: General;  Laterality: N/A;  Laparotomy, Resection and Closure of Colostomy  . Ventral hernia repair  04/19/2012    Procedure: HERNIA REPAIR VENTRAL ADULT;  Surgeon: Adin Hector, MD;  Location: [facility];  Service: General;  Laterality: N/A;  . Abdominal hysterectomy    . Colon surgery     History   Social History  . Marital Status: Widowed    Spouse Name: N/A    Number of Children: N/A  .  Years of Education: N/A   Occupational History  . Not on file.   Social History Main Topics  . Smoking status: Former Smoker -- 1.50 packs/day for 20 years    Types: Cigarettes    Quit date: 10/03/1997  . Smokeless tobacco: Never Used  . Alcohol Use: No  . Drug Use: No  . Sexual Activity: Not on file   Other Topics Concern  . Not on file   Social History Narrative   married   Family History  Problem Relation Age of Onset  . Heart disease Mother   . Cancer Sister   . Diabetes Brother   . Cancer Sister   . Diabetes Sister   . Heart disease Sister   . Cancer Other     GE junction adenocarcinoma   Current Outpatient Prescriptions on File Prior to Visit  Medication Sig Dispense Refill  . Biotin 1 MG CAPS Take by mouth.      [name], Borago officinalis, (BORAGE OIL) 1000 MG CAPS Take by mouth.      . Coenzyme Q10 (CO Q 10) 100 MG CAPS Take by mouth.      . docusate sodium (COLACE) 100 MG capsule Take 100 mg by mouth at bedtime.      . DULoxetine (CYMBALTA) 60 MG capsule Take 1 capsule (60 mg total) by mouth daily.  30 capsule  3  . levothyroxine (SYNTHROID, LEVOTHROID) 75 MCG tablet Take  75 mcg by mouth every morning.       . Omega 3 1200 MG CAPS Take by mouth.      . triamterene-hydrochlorothiazide (DYAZIDE) 37.5-25 MG per capsule Take 1 each (1 capsule total) by mouth daily.  30 capsule  3  . doxycycline (VIBRA-TABS) 100 MG tablet Take 1 tablet (100 mg total) by mouth 2 (two) times daily.  20 tablet  0   No current facility-administered medications on file prior to visit.   Allergies  Allergen Reactions  . Toradol [Ketorolac Tromethamine] Shortness Of Breath  . Demerol Swelling  . Esgic [Butalbital-Apap-Caffeine]     jittery  . Iodine Other (See Comments)    bp bottomed out per pt several hours later; unsure if pre medicated in past with cm; done in [place]   Immunization History  Administered Date(s) Administered  . Influenza Whole 04/29/2012  . Influenza,inj,Quad  PF,36+ Mos 05/05/2013   Prior to Admission medications   Medication Sig Start Date End Date Taking? Authorizing Provider  amLODipine-olmesartan (AZOR) 10-40 MG per tablet Take 1 tablet by mouth daily. 10/24/13  Yes Vernie Shanks, MD  Biotin 1 MG CAPS Take by mouth.   Yes Historical Provider, MD  Borage, Borago officinalis, (BORAGE OIL) 1000 MG CAPS Take by mouth.   Yes Historical Provider, MD  Coenzyme Q10 (CO Q 10) 100 MG CAPS Take by mouth.   Yes Historical Provider, MD  docusate sodium (COLACE) 100 MG capsule Take 100 mg by mouth at bedtime.   Yes Historical Provider, MD  DULoxetine (CYMBALTA) 60 MG capsule Take 1 capsule (60 mg total) by mouth daily. 09/23/13  Yes Vernie Shanks, MD  levothyroxine (SYNTHROID, LEVOTHROID) 75 MCG tablet Take 75 mcg by mouth every morning.    Yes Historical Provider, MD  Omega 3 1200 MG CAPS Take by mouth.   Yes Historical Provider, MD  traMADol (ULTRAM) 50 MG tablet Take 1 tablet (50 mg total) by mouth every 6 (six) hours as needed. 10/24/13  Yes Vernie Shanks, MD  triamterene-hydrochlorothiazide (DYAZIDE) 37.5-25 MG per capsule Take 1 each (1 capsule total) by mouth daily. 10/06/13  Yes Vernie Shanks, MD  zolpidem (AMBIEN CR) 12.5 MG CR tablet TAKE 1 TABLET DAILY AT BEDTIME AS NEEDED 10/24/13  Yes Vernie Shanks, MD  doxycycline (VIBRA-TABS) 100 MG tablet Take 1 tablet (100 mg total) by mouth 2 (two) times daily. 10/21/13   [name], FNP     ROS: As above in the HPI. All other systems are stable or negative.  OBJECTIVE: APPEARANCE:  Patient in no acute distress.The patient appeared well nourished and normally developed. Acyanotic. Waist: VITAL SIGNS:BP 124/80  Pulse 88  Temp(Src) 97.1 F (36.2 C) (Oral)  Ht 5' 1"  (1.549 m)  Wt 123 lb (55.792 kg)  BMI 23.25 kg/m2 WF  SKIN: warm and  Dry without overt rashes, tattoos and scars  HEAD and Neck: without JVD, Head and scalp: normal Eyes:No scleral icterus. Fundi normal, eye movements  normal. Ears: Auricle normal, canal normal, Tympanic membranes normal, insufflation normal. Nose: normal Throat: normal Neck & thyroid: normal  CHEST & LUNGS: Chest wall: normal Lungs: Clear  CVS: Reveals the PMI to be normally located. Regular rhythm, First and Second Heart sounds are normal,  absence of murmurs, rubs or gallops. Peripheral vasculature: Radial pulses: normal Dorsal pedis pulses: normal Posterior pulses: normal  ABDOMEN:  Appearance: normal Benign, no organomegaly, no masses, no Abdominal Aortic enlargement. No Guarding , no rebound.  No Bruits. Bowel sounds: normal  RECTAL: N/A GU: N/A  EXTREMETIES: nonedematous.  MUSCULOSKELETAL:  Spine: reduced ROM  NEUROLOGIC: oriented to time,place and person; nonfocal. Cranial Nerves are normal.  Results for orders placed in visit on 10/10/13  [facility]      Result Value Ref Range   Glucose 120 (*) 65 - 99 mg/dL   BUN 19  8 - 27 mg/dL   Creatinine, Ser 0.69  0.57 - 1.00 mg/dL   GFR calc non Af Amer 92  >59 mL/min/1.73   GFR calc Af Amer 106  >59 mL/min/1.73   BUN/Creatinine Ratio 28 (*) 11 - 26   Sodium 137  134 - 144 mmol/L   Potassium 3.9  3.5 - 5.2 mmol/L   Chloride 96 (*) 97 - 108 mmol/L   CO2 25  18 - 29 mmol/L   Calcium 10.2  8.7 - 10.3 mg/dL    ASSESSMENT: Hypokalemia - Plan: BMP8+EGFR  Hypertension - Plan: amLODipine-olmesartan (AZOR) 10-40 MG per tablet  HLD (hyperlipidemia)  Chronic pain syndrome - Plan: traMADol (ULTRAM) 50 MG tablet  Chronic back pain - Plan: traMADol (ULTRAM) 50 MG tablet  Insomnia - Plan: zolpidem (AMBIEN CR) 12.5 MG CR tablet  PLAN:  Continue to work on improving her emotional status and health status.   Orders Placed This Encounter  Procedures  . BMP8+EGFR   Meds ordered this encounter  Medications  . traMADol (ULTRAM) 50 MG tablet    Sig: Take 1 tablet (50 mg total) by mouth every 6 (six) hours as needed.    Dispense:  60 tablet    Refill:  0  .  zolpidem (AMBIEN CR) 12.5 MG CR tablet    Sig: TAKE 1 TABLET DAILY AT BEDTIME AS NEEDED    Dispense:  30 tablet    Refill:  1    2ND TIME SENDING  . amLODipine-olmesartan (AZOR) 10-40 MG per tablet    Sig: Take 1 tablet by mouth daily.    Dispense:  28 tablet    Refill:  0   Medications Discontinued During This Encounter  Medication Reason  . traMADol (ULTRAM) 50 MG tablet Reorder  . zolpidem (AMBIEN CR) 12.5 MG CR tablet Reorder  . amLODipine-olmesartan (AZOR) 10-40 MG per tablet Reorder   Return in about 3 months (around 01/23/2014) for recheck BP.  Angelicia Lessner P. Jacelyn Grip, M.D.

## 2013-10-25 LAB — BMP8+EGFR
BUN/Creatinine Ratio: 22 (ref 11–26)
BUN: 15 mg/dL (ref 8–27)
CO2: 24 mmol/L (ref 18–29)
Calcium: 10.4 mg/dL — ABNORMAL HIGH (ref 8.7–10.3)
Chloride: 93 mmol/L — ABNORMAL LOW (ref 97–108)
Creatinine, Ser: 0.67 mg/dL (ref 0.57–1.00)
GFR calc Af Amer: 107 mL/min/{1.73_m2} (ref >59)
GFR calc non Af Amer: 93 mL/min/{1.73_m2} (ref >59)
Glucose: 89 mg/dL (ref 65–99)
Potassium: 4.4 mmol/L (ref 3.5–5.2)
Sodium: 136 mmol/L (ref 134–144)

## 2013-10-28 ENCOUNTER — Ambulatory Visit: Payer: Medicare Other | Admitting: Physical Therapy

## 2013-10-28 DIAGNOSIS — M545 Low back pain, unspecified: Secondary | ICD-10-CM | POA: Diagnosis not present

## 2013-10-28 DIAGNOSIS — M546 Pain in thoracic spine: Secondary | ICD-10-CM | POA: Diagnosis not present

## 2013-10-28 DIAGNOSIS — Z9889 Other specified postprocedural states: Secondary | ICD-10-CM | POA: Diagnosis not present

## 2013-10-28 DIAGNOSIS — G894 Chronic pain syndrome: Secondary | ICD-10-CM | POA: Diagnosis not present

## 2013-10-28 DIAGNOSIS — M6281 Muscle weakness (generalized): Secondary | ICD-10-CM | POA: Diagnosis not present

## 2013-10-28 DIAGNOSIS — IMO0001 Reserved for inherently not codable concepts without codable children: Secondary | ICD-10-CM | POA: Diagnosis not present

## 2013-10-30 ENCOUNTER — Encounter: Admitting: Physical Therapy

## 2013-11-03 ENCOUNTER — Encounter: Admitting: Physical Therapy

## 2013-11-03 ENCOUNTER — Telehealth: Payer: Self-pay | Admitting: *Deleted

## 2013-11-03 ENCOUNTER — Telehealth: Payer: Self-pay | Admitting: Family Medicine

## 2013-11-03 ENCOUNTER — Other Ambulatory Visit: Payer: Self-pay | Admitting: Family Medicine

## 2013-11-03 DIAGNOSIS — I10 Essential (primary) hypertension: Secondary | ICD-10-CM

## 2013-11-03 MED ORDER — AMLODIPINE-OLMESARTAN 10-40 MG PO TABS: 1.0000 | ORAL_TABLET | Freq: Every day | ORAL | Status: DC

## 2013-11-03 NOTE — Telephone Encounter (Signed)
Aware, script sent in and samples for Azor, one month supply at front.

## 2013-11-03 NOTE — Telephone Encounter (Signed)
[facility],    [facility], please.

## 2013-11-03 NOTE — Telephone Encounter (Signed)
Call patient : Prescription refilled & sent to pharmacy in [place]. Give samples if we have.

## 2013-11-03 NOTE — Telephone Encounter (Signed)
Sent to pharmacy 

## 2013-11-04 ENCOUNTER — Ambulatory Visit: Payer: Medicare Other | Attending: Family Medicine | Admitting: *Deleted

## 2013-11-04 DIAGNOSIS — Z9889 Other specified postprocedural states: Secondary | ICD-10-CM | POA: Diagnosis not present

## 2013-11-04 DIAGNOSIS — M545 Low back pain, unspecified: Secondary | ICD-10-CM | POA: Insufficient documentation

## 2013-11-04 DIAGNOSIS — G894 Chronic pain syndrome: Secondary | ICD-10-CM | POA: Insufficient documentation

## 2013-11-04 DIAGNOSIS — M6281 Muscle weakness (generalized): Secondary | ICD-10-CM | POA: Diagnosis not present

## 2013-11-04 DIAGNOSIS — IMO0001 Reserved for inherently not codable concepts without codable children: Secondary | ICD-10-CM | POA: Insufficient documentation

## 2013-11-04 DIAGNOSIS — M546 Pain in thoracic spine: Secondary | ICD-10-CM | POA: Insufficient documentation

## 2013-11-05 ENCOUNTER — Encounter: Admitting: Physical Therapy

## 2013-11-06 ENCOUNTER — Ambulatory Visit: Payer: Medicare Other | Admitting: Physical Therapy

## 2013-11-06 DIAGNOSIS — M6281 Muscle weakness (generalized): Secondary | ICD-10-CM | POA: Diagnosis not present

## 2013-11-06 DIAGNOSIS — G894 Chronic pain syndrome: Secondary | ICD-10-CM | POA: Diagnosis not present

## 2013-11-06 DIAGNOSIS — M545 Low back pain, unspecified: Secondary | ICD-10-CM | POA: Diagnosis not present

## 2013-11-06 DIAGNOSIS — Z9889 Other specified postprocedural states: Secondary | ICD-10-CM | POA: Diagnosis not present

## 2013-11-06 DIAGNOSIS — IMO0001 Reserved for inherently not codable concepts without codable children: Secondary | ICD-10-CM | POA: Diagnosis not present

## 2013-11-06 DIAGNOSIS — M546 Pain in thoracic spine: Secondary | ICD-10-CM | POA: Diagnosis not present

## 2013-11-11 ENCOUNTER — Encounter: Admitting: Physical Therapy

## 2013-11-13 ENCOUNTER — Ambulatory Visit: Payer: Medicare Other | Admitting: Physical Therapy

## 2013-11-13 DIAGNOSIS — M6281 Muscle weakness (generalized): Secondary | ICD-10-CM | POA: Diagnosis not present

## 2013-11-13 DIAGNOSIS — IMO0001 Reserved for inherently not codable concepts without codable children: Secondary | ICD-10-CM | POA: Diagnosis not present

## 2013-11-13 DIAGNOSIS — M545 Low back pain, unspecified: Secondary | ICD-10-CM | POA: Diagnosis not present

## 2013-11-13 DIAGNOSIS — Z9889 Other specified postprocedural states: Secondary | ICD-10-CM | POA: Diagnosis not present

## 2013-11-13 DIAGNOSIS — G894 Chronic pain syndrome: Secondary | ICD-10-CM | POA: Diagnosis not present

## 2013-11-13 DIAGNOSIS — M546 Pain in thoracic spine: Secondary | ICD-10-CM | POA: Diagnosis not present

## 2013-11-14 ENCOUNTER — Other Ambulatory Visit: Payer: Self-pay | Admitting: Family Medicine

## 2013-11-14 DIAGNOSIS — G8929 Other chronic pain: Secondary | ICD-10-CM

## 2013-11-14 DIAGNOSIS — G894 Chronic pain syndrome: Secondary | ICD-10-CM

## 2013-11-14 DIAGNOSIS — M549 Dorsalgia, unspecified: Secondary | ICD-10-CM

## 2013-11-17 MED ORDER — TRAMADOL HCL 50 MG PO TABS: 50.0000 mg | ORAL_TABLET | Freq: Four times a day (QID) | ORAL | Status: DC | PRN

## 2013-11-17 NOTE — Telephone Encounter (Signed)
Last filled 10/24/13, Rx will print

## 2013-11-18 ENCOUNTER — Ambulatory Visit: Payer: Medicare Other | Admitting: *Deleted

## 2013-11-18 ENCOUNTER — Telehealth: Payer: Self-pay | Admitting: *Deleted

## 2013-11-18 DIAGNOSIS — G894 Chronic pain syndrome: Secondary | ICD-10-CM | POA: Diagnosis not present

## 2013-11-18 DIAGNOSIS — M545 Low back pain, unspecified: Secondary | ICD-10-CM | POA: Diagnosis not present

## 2013-11-18 DIAGNOSIS — M546 Pain in thoracic spine: Secondary | ICD-10-CM | POA: Diagnosis not present

## 2013-11-18 DIAGNOSIS — IMO0001 Reserved for inherently not codable concepts without codable children: Secondary | ICD-10-CM | POA: Diagnosis not present

## 2013-11-18 DIAGNOSIS — Z9889 Other specified postprocedural states: Secondary | ICD-10-CM | POA: Diagnosis not present

## 2013-11-18 DIAGNOSIS — M6281 Muscle weakness (generalized): Secondary | ICD-10-CM | POA: Diagnosis not present

## 2013-11-18 NOTE — Telephone Encounter (Signed)
Pt notified RX for Tramadol ready for pick up 

## 2013-11-20 ENCOUNTER — Encounter: Admitting: Physical Therapy

## 2013-11-25 ENCOUNTER — Ambulatory Visit: Payer: Medicare Other | Admitting: *Deleted

## 2013-11-25 DIAGNOSIS — M545 Low back pain, unspecified: Secondary | ICD-10-CM | POA: Diagnosis not present

## 2013-11-25 DIAGNOSIS — M546 Pain in thoracic spine: Secondary | ICD-10-CM | POA: Diagnosis not present

## 2013-11-25 DIAGNOSIS — IMO0001 Reserved for inherently not codable concepts without codable children: Secondary | ICD-10-CM | POA: Diagnosis not present

## 2013-11-25 DIAGNOSIS — M6281 Muscle weakness (generalized): Secondary | ICD-10-CM | POA: Diagnosis not present

## 2013-11-25 DIAGNOSIS — G894 Chronic pain syndrome: Secondary | ICD-10-CM | POA: Diagnosis not present

## 2013-11-25 DIAGNOSIS — Z9889 Other specified postprocedural states: Secondary | ICD-10-CM | POA: Diagnosis not present

## 2013-12-01 ENCOUNTER — Encounter: Admitting: Physical Therapy

## 2013-12-02 ENCOUNTER — Ambulatory Visit: Payer: Medicare Other | Attending: Family Medicine | Admitting: Physical Therapy

## 2013-12-02 DIAGNOSIS — M6281 Muscle weakness (generalized): Secondary | ICD-10-CM | POA: Diagnosis not present

## 2013-12-02 DIAGNOSIS — M546 Pain in thoracic spine: Secondary | ICD-10-CM | POA: Diagnosis not present

## 2013-12-02 DIAGNOSIS — G894 Chronic pain syndrome: Secondary | ICD-10-CM | POA: Insufficient documentation

## 2013-12-02 DIAGNOSIS — M545 Low back pain, unspecified: Secondary | ICD-10-CM | POA: Insufficient documentation

## 2013-12-02 DIAGNOSIS — IMO0001 Reserved for inherently not codable concepts without codable children: Secondary | ICD-10-CM | POA: Diagnosis not present

## 2013-12-02 DIAGNOSIS — Z9889 Other specified postprocedural states: Secondary | ICD-10-CM | POA: Diagnosis not present

## 2013-12-04 ENCOUNTER — Ambulatory Visit: Payer: Medicare Other | Admitting: Physical Therapy

## 2013-12-04 DIAGNOSIS — IMO0001 Reserved for inherently not codable concepts without codable children: Secondary | ICD-10-CM | POA: Diagnosis not present

## 2013-12-10 ENCOUNTER — Telehealth: Payer: Self-pay | Admitting: Family Medicine

## 2013-12-10 ENCOUNTER — Other Ambulatory Visit: Payer: Self-pay | Admitting: Family Medicine

## 2013-12-10 DIAGNOSIS — M549 Dorsalgia, unspecified: Secondary | ICD-10-CM

## 2013-12-10 DIAGNOSIS — G8929 Other chronic pain: Secondary | ICD-10-CM

## 2013-12-10 DIAGNOSIS — G894 Chronic pain syndrome: Secondary | ICD-10-CM

## 2013-12-10 MED ORDER — TRAMADOL HCL 50 MG PO TABS: 50.0000 mg | ORAL_TABLET | Freq: Four times a day (QID) | ORAL | Status: DC | PRN

## 2013-12-12 ENCOUNTER — Other Ambulatory Visit: Payer: Self-pay | Admitting: *Deleted

## 2013-12-12 DIAGNOSIS — G894 Chronic pain syndrome: Secondary | ICD-10-CM

## 2013-12-12 DIAGNOSIS — G8929 Other chronic pain: Secondary | ICD-10-CM

## 2013-12-12 DIAGNOSIS — I10 Essential (primary) hypertension: Secondary | ICD-10-CM

## 2013-12-12 DIAGNOSIS — M549 Dorsalgia, unspecified: Principal | ICD-10-CM

## 2013-12-12 MED ORDER — AMLODIPINE-OLMESARTAN 10-40 MG PO TABS: 1.0000 | ORAL_TABLET | Freq: Every day | ORAL | Status: DC

## 2013-12-12 MED ORDER — LEVOTHYROXINE SODIUM 75 MCG PO TABS: 75.0000 ug | ORAL_TABLET | Freq: Every morning | ORAL | Status: DC

## 2013-12-12 MED ORDER — DULOXETINE HCL 60 MG PO CPEP: 60.0000 mg | ORAL_CAPSULE | Freq: Every day | ORAL | Status: DC

## 2013-12-25 ENCOUNTER — Other Ambulatory Visit: Payer: Self-pay | Admitting: Family Medicine

## 2013-12-25 ENCOUNTER — Telehealth: Payer: Self-pay | Admitting: *Deleted

## 2013-12-25 ENCOUNTER — Other Ambulatory Visit: Payer: Self-pay | Admitting: *Deleted

## 2013-12-25 DIAGNOSIS — G8929 Other chronic pain: Secondary | ICD-10-CM

## 2013-12-25 DIAGNOSIS — M549 Dorsalgia, unspecified: Secondary | ICD-10-CM

## 2013-12-25 DIAGNOSIS — G894 Chronic pain syndrome: Secondary | ICD-10-CM

## 2013-12-25 MED ORDER — TRAMADOL HCL 50 MG PO TABS: 50.0000 mg | ORAL_TABLET | Freq: Four times a day (QID) | ORAL | Status: DC | PRN

## 2013-12-25 MED ORDER — TRIAMTERENE-HCTZ 37.5-25 MG PO TABS: 1.0000 | ORAL_TABLET | Freq: Every day | ORAL | Status: DC

## 2013-12-25 MED ORDER — LEVOTHYROXINE SODIUM 50 MCG PO TABS: 50.0000 ug | ORAL_TABLET | Freq: Every day | ORAL | Status: DC

## 2013-12-25 NOTE — Telephone Encounter (Signed)
Last prescription for levothyroxine was written for 75 mcg but patient states that she has been on 50 mcg for a long time and her TSH has remained stable. I did not see any documentation supporting the increase in dosage. [facility] She normally uses mail order but will need a prescription to cover until she receives mail order.

## 2013-12-25 NOTE — Telephone Encounter (Signed)
Triamterene/Hctz caps 37.5/25 , 90 day supply,one daily, 4 refills  (originally per Dr. Jacelyn Grip but ok per  B. Oxford), faxed to [phone] mail order.

## 2013-12-25 NOTE — Telephone Encounter (Signed)
Tramadol is written for every 6 hrs prn # 60. If she takes it 4 times a day this only lasts 2 weeks. Do you want to increase the quantity of have her decrease the amount she is taking?

## 2013-12-25 NOTE — Addendum Note (Signed)
Addended by: Ilean China on: 12/25/2013 12:00 PM   Modules accepted: Orders

## 2013-12-25 NOTE — Telephone Encounter (Signed)
The tramadol is ordered q6 hours prn pain and I will increase her rx to #90 w/3rf and do not want to escalate past this. If this is not working please follow up

## 2013-12-26 NOTE — Telephone Encounter (Signed)
Rx up front waiting for patient to pickup.

## 2013-12-26 NOTE — Telephone Encounter (Signed)
PAtient aware 

## 2014-01-09 ENCOUNTER — Ambulatory Visit (INDEPENDENT_AMBULATORY_CARE_PROVIDER_SITE_OTHER): Payer: Medicare Other | Admitting: Family Medicine

## 2014-01-09 ENCOUNTER — Encounter: Payer: Self-pay | Admitting: Family Medicine

## 2014-01-09 VITALS — BP 134/76 | HR 78 | Temp 98.8°F | Ht 61.0 in | Wt 117.2 lb

## 2014-01-09 DIAGNOSIS — E785 Hyperlipidemia, unspecified: Secondary | ICD-10-CM | POA: Diagnosis not present

## 2014-01-09 DIAGNOSIS — I10 Essential (primary) hypertension: Secondary | ICD-10-CM | POA: Diagnosis not present

## 2014-01-09 DIAGNOSIS — G4700 Insomnia, unspecified: Secondary | ICD-10-CM

## 2014-01-09 DIAGNOSIS — G43909 Migraine, unspecified, not intractable, without status migrainosus: Secondary | ICD-10-CM

## 2014-01-09 DIAGNOSIS — E559 Vitamin D deficiency, unspecified: Secondary | ICD-10-CM | POA: Diagnosis not present

## 2014-01-09 DIAGNOSIS — Z780 Asymptomatic menopausal state: Secondary | ICD-10-CM

## 2014-01-09 DIAGNOSIS — E039 Hypothyroidism, unspecified: Secondary | ICD-10-CM | POA: Diagnosis not present

## 2014-01-09 LAB — POCT CBC
Granulocyte percent: 72.5 %G (ref 37–80)
HCT, POC: 43.6 % (ref 37.7–47.9)
Hemoglobin: 14.2 g/dL (ref 12.2–16.2)
Lymph, poc: 1.4 (ref 0.6–3.4)
MCH, POC: 29.1 pg (ref 27–31.2)
MCHC: 32.6 g/dL (ref 31.8–35.4)
MCV: 89.4 fL (ref 80–97)
MPV: 7.9 fL (ref 0–99.8)
POC Granulocyte: 4.3 (ref 2–6.9)
POC LYMPH PERCENT: 23.7 %L (ref 10–50)
Platelet Count, POC: 300 10*3/uL (ref 142–424)
RBC: 4.9 M/uL (ref 4.04–5.48)
RDW, POC: 13.6 %
WBC: 6 10*3/uL (ref 4.6–10.2)

## 2014-01-09 MED ORDER — ZOLPIDEM TARTRATE ER 12.5 MG PO TBCR: EXTENDED_RELEASE_TABLET | ORAL | Status: DC

## 2014-01-10 LAB — CMP14+EGFR
ALT: 14 IU/L (ref 0–32)
AST: 19 IU/L (ref 0–40)
Albumin/Globulin Ratio: 2.2 (ref 1.1–2.5)
Albumin: 4.7 g/dL (ref 3.6–4.8)
Alkaline Phosphatase: 77 IU/L (ref 39–117)
BUN/Creatinine Ratio: 11 (ref 11–26)
BUN: 9 mg/dL (ref 8–27)
CO2: 23 mmol/L (ref 18–29)
Calcium: 10.7 mg/dL — ABNORMAL HIGH (ref 8.7–10.3)
Chloride: 94 mmol/L — ABNORMAL LOW (ref 97–108)
Creatinine, Ser: 0.84 mg/dL (ref 0.57–1.00)
GFR calc Af Amer: 84 mL/min/{1.73_m2} (ref >59)
GFR calc non Af Amer: 73 mL/min/{1.73_m2} (ref >59)
Globulin, Total: 2.1 g/dL (ref 1.5–4.5)
Glucose: 88 mg/dL (ref 65–99)
Potassium: 3.2 mmol/L — ABNORMAL LOW (ref 3.5–5.2)
Sodium: 137 mmol/L (ref 134–144)
Total Bilirubin: 0.4 mg/dL (ref 0.0–1.2)
Total Protein: 6.8 g/dL (ref 6.0–8.5)

## 2014-01-10 LAB — TSH: TSH: 2.23 u[IU]/mL (ref 0.450–4.500)

## 2014-01-10 LAB — VITAMIN D 25 HYDROXY (VIT D DEFICIENCY, FRACTURES): Vit D, 25-Hydroxy: 22.4 ng/mL — ABNORMAL LOW (ref 30.0–100.0)

## 2014-01-12 NOTE — Progress Notes (Signed)
Subjective:    Patient ID: Stacy Dalton, female    DOB: 01/30/1951, 63 y.o.   MRN: [id number]  HPI  This 63 y.o. female presents for evaluation of routine follow up visit.  She has hx of hypothyroidism, HTN, hyperlipidemia, and insomnia.  She needs refill on ambien for her insomnia which works well. She has not had BMD.  She denies any acute medical concerns.   Review of Systems No chest pain, SOB, HA, dizziness, vision change, N/V, diarrhea, constipation, dysuria, urinary urgency or frequency, myalgias, arthralgias or rash.     Objective:   Physical Exam  Vital signs noted  Well developed well nourished female.  HEENT - Head atraumatic Normocephalic                Eyes - PERRLA, Conjuctiva - clear Sclera- Clear EOMI                Ears - EAC's Wnl TM's Wnl Gross Hearing WNL                Nose - Nares patent                 Throat - oropharanx wnl Respiratory - Lungs CTA bilateral Cardiac - RRR S1 and S2 without murmur GI - Abdomen soft Nontender and bowel sounds active x 4 Extremities - No edema. Neuro - Grossly intact.      Assessment & Plan:  Unspecified hypothyroidism - Plan: POCT CBC, CMP14+EGFR, TSH, Vit D  25 hydroxy (rtn osteoporosis monitoring)  Essential hypertension, benign - Plan: POCT CBC, CMP14+EGFR, TSH, Vit D  25 hydroxy (rtn osteoporosis monitoring)  Other and unspecified hyperlipidemia - Plan: POCT CBC, CMP14+EGFR, TSH, Vit D  25 hydroxy (rtn osteoporosis monitoring)  Insomnia - Plan: zolpidem (AMBIEN CR) 12.5 MG CR tablet  Post-menopause - Plan: DG Bone Density  Follow up in 4 months  William J Oxford FNP 

## 2014-01-13 ENCOUNTER — Other Ambulatory Visit: Payer: Self-pay | Admitting: Family Medicine

## 2014-01-13 DIAGNOSIS — E876 Hypokalemia: Secondary | ICD-10-CM

## 2014-01-13 MED ORDER — POTASSIUM CHLORIDE CRYS ER 20 MEQ PO TBCR: 20.0000 meq | EXTENDED_RELEASE_TABLET | Freq: Every day | ORAL | Status: DC

## 2014-01-16 ENCOUNTER — Telehealth: Payer: Self-pay | Admitting: Family Medicine

## 2014-01-16 ENCOUNTER — Telehealth: Payer: Self-pay | Admitting: *Deleted

## 2014-01-16 DIAGNOSIS — G8929 Other chronic pain: Secondary | ICD-10-CM

## 2014-01-16 DIAGNOSIS — M549 Dorsalgia, unspecified: Principal | ICD-10-CM

## 2014-01-16 DIAGNOSIS — G894 Chronic pain syndrome: Secondary | ICD-10-CM

## 2014-01-16 MED ORDER — DULOXETINE HCL 60 MG PO CPEP: 60.0000 mg | ORAL_CAPSULE | Freq: Every day | ORAL | Status: DC

## 2014-01-16 NOTE — Telephone Encounter (Signed)
done

## 2014-01-19 ENCOUNTER — Other Ambulatory Visit: Payer: Self-pay | Admitting: Family Medicine

## 2014-02-27 ENCOUNTER — Other Ambulatory Visit (INDEPENDENT_AMBULATORY_CARE_PROVIDER_SITE_OTHER): Payer: Medicare Other

## 2014-02-27 DIAGNOSIS — E875 Hyperkalemia: Secondary | ICD-10-CM | POA: Diagnosis not present

## 2014-02-27 NOTE — Progress Notes (Signed)
Lab only 

## 2014-02-28 LAB — BMP8+EGFR
BUN/Creatinine Ratio: 15 (ref 11–26)
BUN: 13 mg/dL (ref 8–27)
CO2: 26 mmol/L (ref 18–29)
Calcium: 9.5 mg/dL (ref 8.7–10.3)
Chloride: 95 mmol/L — ABNORMAL LOW (ref 97–108)
Creatinine, Ser: 0.85 mg/dL (ref 0.57–1.00)
GFR calc Af Amer: 83 mL/min/{1.73_m2} (ref >59)
GFR calc non Af Amer: 72 mL/min/{1.73_m2} (ref >59)
Glucose: 86 mg/dL (ref 65–99)
Potassium: 4 mmol/L (ref 3.5–5.2)
Sodium: 139 mmol/L (ref 134–144)

## 2014-03-02 ENCOUNTER — Telehealth: Payer: Self-pay | Admitting: Family Medicine

## 2014-03-02 NOTE — Telephone Encounter (Signed)
Message copied by Waverly Ferrari on Mon Mar 02, 2014  8:36 AM ------      Message from: [name]      Created: Mon Mar 02, 2014  8:08 AM       K is normal ------

## 2014-03-10 ENCOUNTER — Other Ambulatory Visit: Payer: Self-pay | Admitting: Family Medicine

## 2014-03-10 ENCOUNTER — Telehealth: Payer: Self-pay

## 2014-03-10 ENCOUNTER — Telehealth: Payer: Self-pay | Admitting: Family Medicine

## 2014-03-10 DIAGNOSIS — G8929 Other chronic pain: Secondary | ICD-10-CM

## 2014-03-10 DIAGNOSIS — M549 Dorsalgia, unspecified: Secondary | ICD-10-CM

## 2014-03-10 DIAGNOSIS — G894 Chronic pain syndrome: Secondary | ICD-10-CM

## 2014-03-10 MED ORDER — TRAMADOL HCL 50 MG PO TABS: 50.0000 mg | ORAL_TABLET | Freq: Four times a day (QID) | ORAL | Status: DC | PRN

## 2014-03-11 NOTE — Telephone Encounter (Signed)
Letter sent with new appointment date/time

## 2014-03-18 NOTE — Telephone Encounter (Signed)
DB wrote 03-10-14

## 2014-04-14 ENCOUNTER — Telehealth: Payer: Self-pay | Admitting: Family Medicine

## 2014-04-14 NOTE — Telephone Encounter (Signed)
Appointment scheduled for 10/23 with [facility]

## 2014-04-15 ENCOUNTER — Ambulatory Visit: Payer: Medicare Other | Admitting: Family Medicine

## 2014-04-15 ENCOUNTER — Ambulatory Visit: Payer: Medicare Other

## 2014-04-15 ENCOUNTER — Other Ambulatory Visit: Payer: Medicare Other

## 2014-04-22 ENCOUNTER — Other Ambulatory Visit: Payer: Self-pay | Admitting: Family Medicine

## 2014-04-23 NOTE — Telephone Encounter (Signed)
Last seen 01/09/14  B Oxford  requesting 90 day supply

## 2014-04-24 ENCOUNTER — Other Ambulatory Visit: Payer: Self-pay

## 2014-04-24 ENCOUNTER — Other Ambulatory Visit: Payer: Self-pay | Admitting: *Deleted

## 2014-04-24 ENCOUNTER — Ambulatory Visit (INDEPENDENT_AMBULATORY_CARE_PROVIDER_SITE_OTHER): Payer: Medicare Other | Admitting: Family Medicine

## 2014-04-24 ENCOUNTER — Encounter: Payer: Self-pay | Admitting: Family Medicine

## 2014-04-24 VITALS — BP 135/73 | HR 84 | Temp 97.9°F | Ht 61.0 in | Wt 123.8 lb

## 2014-04-24 DIAGNOSIS — G894 Chronic pain syndrome: Secondary | ICD-10-CM | POA: Diagnosis not present

## 2014-04-24 DIAGNOSIS — B001 Herpesviral vesicular dermatitis: Secondary | ICD-10-CM

## 2014-04-24 DIAGNOSIS — M549 Dorsalgia, unspecified: Secondary | ICD-10-CM | POA: Diagnosis not present

## 2014-04-24 DIAGNOSIS — G4700 Insomnia, unspecified: Secondary | ICD-10-CM

## 2014-04-24 DIAGNOSIS — G8929 Other chronic pain: Secondary | ICD-10-CM

## 2014-04-24 MED ORDER — DULOXETINE HCL 60 MG PO CPEP: 60.0000 mg | ORAL_CAPSULE | Freq: Every day | ORAL | Status: DC

## 2014-04-24 MED ORDER — FAMCICLOVIR 500 MG PO TABS: 500.0000 mg | ORAL_TABLET | Freq: Three times a day (TID) | ORAL | Status: DC

## 2014-04-24 MED ORDER — TRAMADOL HCL 50 MG PO TABS: 50.0000 mg | ORAL_TABLET | Freq: Four times a day (QID) | ORAL | Status: DC | PRN

## 2014-04-24 MED ORDER — ZOLPIDEM TARTRATE ER 12.5 MG PO TBCR: EXTENDED_RELEASE_TABLET | ORAL | Status: DC

## 2014-04-24 NOTE — Progress Notes (Signed)
   Subjective:    Patient ID: Stacy Dalton, female    DOB: May 14, 1951, 63 y.o.   MRN: [id number]  HPI C/o cold sores and here for follow up on chronic pain and insomnia.  She needs refills on [place].  She states her pain is mild to moderate and the ultram helps greatly.  She has insomnia and the [place] works well.  Review of Systems  Constitutional: Negative for fever.  HENT: Negative for ear pain.   Eyes: Negative for discharge.  Respiratory: Negative for cough.   Cardiovascular: Negative for chest pain.  Gastrointestinal: Negative for abdominal distention.  Endocrine: Negative for polyuria.  Genitourinary: Negative for difficulty urinating.  Musculoskeletal: Negative for gait problem and neck pain.  Skin: Negative for color change and rash.  Neurological: Negative for speech difficulty and headaches.  Psychiatric/Behavioral: Negative for agitation.       Objective:    BP 135/73  Pulse 84  Temp(Src) 97.9 F (36.6 C) (Oral)  Ht 5\' 1"  (1.549 m)  Wt 123 lb 12.8 oz (56.155 kg)  BMI 23.40 kg/m2 Physical Exam  Constitutional: She is oriented to person, place, and time. She appears well-developed and well-nourished.  HENT:  Head: Normocephalic and atraumatic.  Mouth/Throat: Oropharynx is clear and moist.  Eyes: Pupils are equal, round, and reactive to light.  Neck: Normal range of motion. Neck supple.  Cardiovascular: Normal rate and regular rhythm.   No murmur heard. Pulmonary/Chest: Effort normal and breath sounds normal.  Abdominal: Soft. Bowel sounds are normal. There is no tenderness.  Neurological: She is alert and oriented to person, place, and time.  Skin: Skin is warm and dry.  Psychiatric: She has a normal mood and affect.          Assessment & Plan:     ICD-9-CM ICD-10-CM   1. Insomnia 780.52 G47.00 zolpidem (AMBIEN CR) 12.5 MG CR tablet     DISCONTINUED: zolpidem (AMBIEN CR) 12.5 MG CR tablet  2. Chronic back pain 724.5 M54.9 traMADol  (ULTRAM) 50 MG tablet   338.29 G89.29 DULoxetine (CYMBALTA) 60 MG capsule     DISCONTINUED: DULoxetine (CYMBALTA) 60 MG capsule     DISCONTINUED: traMADol (ULTRAM) 50 MG tablet     DISCONTINUED: DULoxetine (CYMBALTA) 60 MG capsule  3. Chronic pain syndrome 338.4 G89.4 traMADol (ULTRAM) 50 MG tablet     DULoxetine (CYMBALTA) 60 MG capsule     DISCONTINUED: DULoxetine (CYMBALTA) 60 MG capsule     DISCONTINUED: traMADol (ULTRAM) 50 MG tablet     DISCONTINUED: DULoxetine (CYMBALTA) 60 MG capsule  4. Herpes labialis 054.9 B00.1 famciclovir (FAMVIR) 500 MG tablet     No Follow-up on file.  [name] FNP

## 2014-04-24 NOTE — Progress Notes (Signed)
Transmission failed at [facility] by [facility]

## 2014-04-24 NOTE — Telephone Encounter (Signed)
Last seen 01/09/14 B Oxford  This is for mail order  If approved print and route to nurse

## 2014-04-30 ENCOUNTER — Other Ambulatory Visit: Payer: Self-pay | Admitting: Family

## 2014-05-07 ENCOUNTER — Telehealth: Payer: Self-pay | Admitting: Family Medicine

## 2014-05-07 ENCOUNTER — Ambulatory Visit (INDEPENDENT_AMBULATORY_CARE_PROVIDER_SITE_OTHER): Payer: Medicare Other | Admitting: Family Medicine

## 2014-05-07 ENCOUNTER — Encounter: Payer: Self-pay | Admitting: Family Medicine

## 2014-05-07 VITALS — BP 162/76 | HR 86 | Temp 98.6°F | Ht 61.0 in | Wt 119.0 lb

## 2014-05-07 DIAGNOSIS — N3090 Cystitis, unspecified without hematuria: Secondary | ICD-10-CM

## 2014-05-07 DIAGNOSIS — R300 Dysuria: Secondary | ICD-10-CM | POA: Diagnosis not present

## 2014-05-07 LAB — POCT URINALYSIS DIPSTICK
Bilirubin, UA: NEGATIVE
Glucose, UA: NEGATIVE
Ketones, UA: NEGATIVE
Nitrite, UA: POSITIVE
Spec Grav, UA: <=1.005
Urobilinogen, UA: NEGATIVE
pH, UA: 5

## 2014-05-07 LAB — POCT UA - MICROSCOPIC ONLY
Casts, Ur, LPF, POC: NEGATIVE
Crystals, Ur, HPF, POC: NEGATIVE
Mucus, UA: NEGATIVE
Yeast, UA: NEGATIVE

## 2014-05-07 MED ORDER — CIPROFLOXACIN HCL 500 MG PO TABS: 500.0000 mg | ORAL_TABLET | Freq: Two times a day (BID) | ORAL | Status: DC

## 2014-05-07 NOTE — Progress Notes (Signed)
Subjective:    Patient ID: Stacy Dalton, female    DOB: 01-30-1951, 63 y.o.   MRN: [id number]  HPI Patient here today for possible uti. She started with symptoms about 3 days ago.the patient has painful urgency and frequency.the patient has had some structural issues with her urethra and bladder in the past, but indicates her last urinary tract but she was several months ago.         Patient Active Problem List   Diagnosis Date Noted  . Left nasal polyps 06/23/2013  . Hypothyroidism   . Frequent urination 11/18/2012  . UTI (urinary tract infection) 11/18/2012  . Depression 11/18/2012  . Insomnia 11/18/2012  . Chronic pain syndrome 11/18/2012  . HLD (hyperlipidemia) 09/01/2012  . Adjustment disorder with mixed anxiety and depressed mood 09/01/2012    Class: Chronic  . 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 Normocytic anemia 10/15/2011  . Hypertension 10/13/2011  . Chronic back pain 10/13/2011  . Tobacco use 10/13/2011  . Migraine 10/13/2011  . COPD (chronic obstructive pulmonary disease) 10/09/2011   Outpatient Encounter Prescriptions as of 05/07/2014  Medication Sig  . AZOR 10-40 MG per tablet TAKE 1 TABLET DAILY  . Biotin 1 MG CAPS Take by mouth.  [name], Borago officinalis, (BORAGE OIL) 1000 MG CAPS Take by mouth.  . Coenzyme Q10 (CO Q 10) 100 MG CAPS Take by mouth.  . docusate sodium (COLACE) 100 MG capsule Take 100 mg by mouth at bedtime.  . DULoxetine (CYMBALTA) 60 MG capsule Take 1 capsule (60 mg total) by mouth daily.  [name] levothyroxine (SYNTHROID, LEVOTHROID) 50 MCG tablet Take 1 tablet (50 mcg total) by mouth daily.  . Omega 3 1200 MG CAPS Take by mouth.  . traMADol (ULTRAM) 50 MG tablet Take 1 tablet (50 mg total) by mouth every 6 (six) hours as needed.  . triamterene-hydrochlorothiazide (MAXZIDE-25) 37.5-25 MG per tablet Take 1 tablet by mouth daily.  [name] zolpidem (AMBIEN CR) 12.5 MG CR tablet TAKE 1 TABLET DAILY AT BEDTIME AS NEEDED  . [DISCONTINUED] famciclovir  (FAMVIR) 500 MG tablet Take 1 tablet (500 mg total) by mouth 3 (three) times daily.    Review of Systems  Constitutional: Negative.   HENT: Negative.   Eyes: Negative.   Respiratory: Negative.   Cardiovascular: Negative.   Gastrointestinal: Negative.   Endocrine: Negative.   Genitourinary: Positive for dysuria, urgency and frequency.  Musculoskeletal: Negative.   Skin: Negative.   Allergic/Immunologic: Negative.   Neurological: Negative.   Hematological: Negative.   Psychiatric/Behavioral: Negative.        Objective:   Physical Exam  Constitutional: She is oriented to person, place, and time. She appears well-developed and well-nourished. No distress.  HENT:  Head: Normocephalic.  Eyes: Conjunctivae and EOM are normal. Pupils are equal, round, and reactive to light. Right eye exhibits no discharge. Left eye exhibits no discharge. No scleral icterus.  Neck: Normal range of motion.  Abdominal: Soft. Bowel sounds are normal. She exhibits no distension. There is tenderness. There is no rebound and no guarding.  Suprapubic tenderness. No flank tenderness.  Musculoskeletal: Normal range of motion.  Neurological: She is alert and oriented to person, place, and time.  Skin: Skin is warm and dry. No rash noted.  Psychiatric: She has a normal mood and affect. Her behavior is normal. Judgment and thought content normal.  Patient is somewhat tearful about the loss of her husband 19 months ago.  Nursing note and vitals reviewed.  BP 162/76 mmHg  Pulse 86  Temp(Src) 98.6 F (37 C) (Oral)  Ht 5\' 1"  (1.549 m)  Wt 119 lb (53.978 kg)  BMI 22.50 kg/m2  The patient was aware of the urine dipstick that was positive for nitrite before she left the office.       Assessment & Plan:  1. Dysuria - POCT UA - Microscopic Only - POCT urinalysis dipstick - Urine culture - ciprofloxacin (CIPRO) 500 MG tablet; Take 1 tablet (500 mg total) by mouth 2 (two) times daily.  Dispense: 14 tablet;  Refill: 0  2. Cystitis - ciprofloxacin (CIPRO) 500 MG tablet; Take 1 tablet (500 mg total) by mouth 2 (two) times daily.  Dispense: 14 tablet; Refill: 0  Patient Instructions  Drink plenty of fluids Take antibiotic as directed Recheck urinalysis in 2 weeks We will call you with the results of the culture and sensitivity when that becomes available   Arrie Senate MD

## 2014-05-07 NOTE — Telephone Encounter (Signed)
Pt given appt today with moore @ 5:30

## 2014-05-07 NOTE — Patient Instructions (Signed)
Drink plenty of fluids Take antibiotic as directed Recheck urinalysis in 2 weeks We will call you with the results of the culture and sensitivity when that becomes available

## 2014-05-09 LAB — URINE CULTURE

## 2014-05-11 ENCOUNTER — Telehealth: Payer: Self-pay | Admitting: Family Medicine

## 2014-05-11 NOTE — Telephone Encounter (Signed)
Pt aware of results and will call to schedule appt for rck urine once she finishes cipro.

## 2014-05-11 NOTE — Telephone Encounter (Signed)
-----   Message from Chipper Herb, MD sent at 05/09/2014  4:50 PM EST ----- The bacteria growing on the urine culture were sensitive to the Cipro Floxin that you are taking. It is important that you continue and complete the antibiotic and once it is completed you should come by the office for a repeat clean catch midstream urine specimen

## 2014-05-12 ENCOUNTER — Encounter: Payer: Self-pay | Admitting: *Deleted

## 2014-05-22 ENCOUNTER — Telehealth: Payer: Self-pay | Admitting: *Deleted

## 2014-05-22 ENCOUNTER — Other Ambulatory Visit (INDEPENDENT_AMBULATORY_CARE_PROVIDER_SITE_OTHER): Payer: Medicare Other

## 2014-05-22 DIAGNOSIS — R350 Frequency of micturition: Secondary | ICD-10-CM

## 2014-05-22 LAB — POCT URINALYSIS DIPSTICK
Bilirubin, UA: NEGATIVE
Blood, UA: NEGATIVE
Glucose, UA: NEGATIVE
Ketones, UA: NEGATIVE
Leukocytes, UA: NEGATIVE
Nitrite, UA: NEGATIVE
Protein, UA: NEGATIVE
Spec Grav, UA: <=1.005
Urobilinogen, UA: NEGATIVE
pH, UA: 6.5

## 2014-05-22 LAB — POCT UA - MICROSCOPIC ONLY
Bacteria, U Microscopic: NEGATIVE
Casts, Ur, LPF, POC: NEGATIVE
Crystals, Ur, HPF, POC: NEGATIVE
Mucus, UA: NEGATIVE
RBC, urine, microscopic: NEGATIVE
WBC, Ur, HPF, POC: NEGATIVE
Yeast, UA: NEGATIVE

## 2014-05-22 NOTE — Telephone Encounter (Signed)
-----   Message from Chipper Herb, MD sent at 05/22/2014  4:35 PM EST ----- Urinalysis is now clear of infection and red blood cells no further treatment is necessary

## 2014-05-22 NOTE — Progress Notes (Signed)
Lab only 

## 2014-05-25 ENCOUNTER — Encounter: Payer: Self-pay | Admitting: *Deleted

## 2014-05-25 NOTE — Telephone Encounter (Signed)
-----   Message from Chipper Herb, MD sent at 05/22/2014  4:35 PM EST ----- Urinalysis is now clear of infection and red blood cells no further treatment is necessary

## 2014-05-25 NOTE — Telephone Encounter (Signed)
Pt aware of results 

## 2014-06-03 ENCOUNTER — Ambulatory Visit: Payer: Medicare Other

## 2014-06-03 ENCOUNTER — Other Ambulatory Visit: Payer: Self-pay

## 2014-06-10 ENCOUNTER — Ambulatory Visit (INDEPENDENT_AMBULATORY_CARE_PROVIDER_SITE_OTHER): Payer: Medicare Other | Admitting: *Deleted

## 2014-06-10 ENCOUNTER — Ambulatory Visit (INDEPENDENT_AMBULATORY_CARE_PROVIDER_SITE_OTHER): Payer: Medicare Other

## 2014-06-10 ENCOUNTER — Ambulatory Visit (INDEPENDENT_AMBULATORY_CARE_PROVIDER_SITE_OTHER): Payer: Medicare Other | Admitting: Pharmacist

## 2014-06-10 VITALS — Ht 61.0 in | Wt 119.0 lb

## 2014-06-10 DIAGNOSIS — M8580 Other specified disorders of bone density and structure, unspecified site: Secondary | ICD-10-CM

## 2014-06-10 DIAGNOSIS — R7989 Other specified abnormal findings of blood chemistry: Secondary | ICD-10-CM

## 2014-06-10 DIAGNOSIS — Z780 Asymptomatic menopausal state: Secondary | ICD-10-CM | POA: Diagnosis not present

## 2014-06-10 DIAGNOSIS — Z13820 Encounter for screening for osteoporosis: Secondary | ICD-10-CM | POA: Diagnosis not present

## 2014-06-10 DIAGNOSIS — Z23 Encounter for immunization: Secondary | ICD-10-CM

## 2014-06-10 DIAGNOSIS — E559 Vitamin D deficiency, unspecified: Secondary | ICD-10-CM | POA: Insufficient documentation

## 2014-06-10 NOTE — Progress Notes (Signed)
Patient ID: Stacy Dalton, female   DOB: August 15, 1950, 63 y.o.   MRN: [id number]  [facility] Current Height: Height: 5\' 1"  (154.9 cm)      Max Lifetime Height:  5\' 2"  Current Weight: Weight: 119 lb (53.978 kg)       Ethnicity:Caucasian    HPI: Does pt already have a diagnosis of:  Osteopenia?  No Osteoporosis?  No  Back Pain?  Yes       Kyphosis?  No Prior fracture?  No Med(s) for Osteoporosis/Osteopenia:  none Med(s) previously tried for Osteoporosis/Osteopenia:  none                                                             PMH: Age at menopause:  84's Hysterectomy?  Yes Oophorectomy?  No HRT? Yes - Former.  Type/duration: premarin cream Steroid Use?  No Thyroid med?  Yes History of cancer?  No History of digestive disorders (ie Crohn's)?  Yes Current or previous eating disorders?  No Last Vitamin D Result:  22.4 (12/2013) Last GFR Result:  73 (12/2013)   FH/SH: Family history of osteoporosis?  No Parent with history of hip fracture?  No Family history of breast cancer?  No Exercise?  Yes - walking a little Smoking?  No Alcohol?  No    Calcium Assessment Calcium Intake  # of servings/day  Calcium mg  Milk (8 oz) 1  x  300  = 300mg   Yogurt (4 oz) 0 x  200 = 0  Cheese (1 oz) 0.5 x  200 = 100mg   Other Calcium sources   250mg   Ca supplement 0 = 0   Estimated calcium intake per day 650mg     DEXA Results Date of Test T-Score for AP Spine L1-L4 T-Score for Total Left Hip T-Score for Total Right Hip  06/10/2014 -2.2 --0.5 -0.8                  FRAX 10 year estimate: Total FX risk:  11% (consider medication if >/= 20%) Hip FX risk:  2.0%  (consider medication if >/= 3%)  Assessment: Osteopenia  Low serum vitamin D  Recommendations: 1. Discussed BMD results and fracture risk  2.  recommend calcium 1200mg  daily through supplementation or diet.  3.  recommend weight bearing exercise - 30 minutes at least 4 days per week.   4.  Counseled and  educated about fall risk and prevention. 5.  Continue vitamin D supplementation - due to have labs rechecked with next blood draw 6.   Influenza vaccine given in office today  Recheck DEXA:  2 years  Time spent counseling patient:  20 minutes  Cherre Robins, PharmD, CPP

## 2014-06-10 NOTE — Patient Instructions (Signed)
Exercise for Strong Bones  Exercise is important to build and maintain strong bones / bone density.  There are 2 types of exercises that are important to building and maintaining strong bones:  Weight- bearing and muscle-stregthening.  Weight-bearing Exercises  These exercises include activities that make you move against gravity while staying upright. Weight-bearing exercises can be high-impact or low-impact.  High-impact weight-bearing exercises help build bones and keep them strong. If you have broken a bone due to osteoporosis or are at risk of breaking a bone, you may need to avoid high-impact exercises. If you're not sure, you should check with your healthcare provider.  Examples of high-impact weight-bearing exercises are: Dancing  Doing high-impact aerobics  Hiking  Jogging/running  Jumping Rope  Stair climbing  Tennis  Low-impact weight-bearing exercises can also help keep bones strong and are a safe alternative if you cannot do high-impact exercises.   Examples of low-impact weight-bearing exercises are: Using elliptical training machines  Doing low-impact aerobics  Using stair-step machines  Fast walking on a treadmill or outside   Muscle-Strengthening Exercises These exercises include activities where you move your body, a weight or some other resistance against gravity. They are also known as resistance exercises and include: Lifting weights  Using elastic exercise bands  Using weight machines  Lifting your own body weight  Functional movements, such as standing and rising up on your toes  Yoga and Pilates can also improve strength, balance and flexibility. However, certain positions may not be safe for people with osteoporosis or those at increased risk of broken bones. For example, exercises that have you bend forward may increase the chance of breaking a bone in the spine.   Non-Impact Exercises There are other types of exercises that can help  prevent falls.  Non-impact exercises can help you to improve balance, posture and how well you move in everyday activities. Some of these exercises include: Balance exercises that strengthen your legs and test your balance, such as Tai Chi, can decrease your risk of falls.  Posture exercises that improve your posture and reduce rounded or "sloping" shoulders can help you decrease the chance of breaking a bone, especially in the spine.  Functional exercises that improve how well you move can help you with everyday activities and decrease your chance of falling and breaking a bone. For example, if you have trouble getting up from a chair or climbing stairs, you should do these activities as exercises.   **A physical therapist can teach you balance, posture and functional exercises. He/she can also help you learn which exercises are safe and appropriate for you.  [facility] has a physical therapy office in [place] in front of our office and referrals can be made for assessments and treatment as needed and strength and balance training.  If you would like to have an assessment with [facility] and our physical therapy team please let a nurse or provider know.   Fall Prevention and Home Safety Falls cause injuries and can affect all age groups. It is possible to use preventive measures to significantly decrease the likelihood of falls. There are many simple measures which can make your home safer and prevent falls. OUTDOORS  Repair cracks and edges of walkways and driveways.  Remove high doorway thresholds.  Trim shrubbery on the main path into your home.  Have good outside lighting.  Clear walkways of tools, rocks, debris, and clutter.  Check that handrails are not broken and are securely fastened. Both sides of steps   should have handrails.  Have leaves, snow, and ice cleared regularly.  Use sand or salt on walkways during winter months.  In the garage, clean up grease or oil  spills. BATHROOM  Install night lights.  Install grab bars by the toilet and in the tub and shower.  Use non-skid mats or decals in the tub or shower.  Place a plastic non-slip stool in the shower to sit on, if needed.  Keep floors dry and clean up all water on the floor immediately.  Remove soap buildup in the tub or shower on a regular basis.  Secure bath mats with non-slip, double-sided rug tape.  Remove throw rugs and tripping hazards from the floors. BEDROOMS  Install night lights.  Make sure a bedside light is easy to reach.  Do not use oversized bedding.  Keep a telephone by your bedside.  Have a firm chair with side arms to use for getting dressed.  Remove throw rugs and tripping hazards from the floor. KITCHEN  Keep handles on pots and pans turned toward the center of the stove. Use back burners when possible.  Clean up spills quickly and allow time for drying.  Avoid walking on wet floors.  Avoid hot utensils and knives.  Position shelves so they are not too high or low.  Place commonly used objects within easy reach.  If necessary, use a sturdy step stool with a grab bar when reaching.  Keep electrical cables out of the way.  Do not use floor polish or wax that makes floors slippery. If you must use wax, use non-skid floor wax.  Remove throw rugs and tripping hazards from the floor. STAIRWAYS  Never leave objects on stairs.  Place handrails on both sides of stairways and use them. Fix any loose handrails. Make sure handrails on both sides of the stairways are as long as the stairs.  Check carpeting to make sure it is firmly attached along stairs. Make repairs to worn or loose carpet promptly.  Avoid placing throw rugs at the top or bottom of stairways, or properly secure the rug with carpet tape to prevent slippage. Get rid of throw rugs, if possible.  Have an electrician put in a light switch at the top and bottom of the stairs. OTHER FALL  PREVENTION TIPS  Wear low-heel or rubber-soled shoes that are supportive and fit well. Wear closed toe shoes.  When using a stepladder, make sure it is fully opened and both spreaders are firmly locked. Do not climb a closed stepladder.  Add color or contrast paint or tape to grab bars and handrails in your home. Place contrasting color strips on first and last steps.  Learn and use mobility aids as needed. Install an electrical emergency response system.  Turn on lights to avoid dark areas. Replace light bulbs that burn out immediately. Get light switches that glow.  Arrange furniture to create clear pathways. Keep furniture in the same place.  Firmly attach carpet with non-skid or double-sided tape.  Eliminate uneven floor surfaces.  Select a carpet pattern that does not visually hide the edge of steps.  Be aware of all pets. OTHER HOME SAFETY TIPS  Set the water temperature for 120 F (48.8 C).  Keep emergency numbers on or near the telephone.  Keep smoke detectors on every level of the home and near sleeping areas. Document Released: 06/09/2002 Document Revised: 12/19/2011 Document Reviewed: 09/08/2011 Exit[employer] Patient Information 2015 [employer]. This information is not intended to replace advice given   to you by your health care provider. Make sure you discuss any questions you have with your health care provider.  

## 2014-07-02 ENCOUNTER — Other Ambulatory Visit: Payer: Self-pay | Admitting: Family Medicine

## 2014-07-12 ENCOUNTER — Other Ambulatory Visit: Payer: Self-pay | Admitting: Family

## 2014-08-13 ENCOUNTER — Encounter: Payer: Self-pay | Admitting: Family

## 2014-08-13 ENCOUNTER — Ambulatory Visit (INDEPENDENT_AMBULATORY_CARE_PROVIDER_SITE_OTHER): Payer: Medicare Other | Admitting: Family

## 2014-08-13 VITALS — BP 139/78 | HR 99 | Temp 97.2°F | Ht 61.0 in | Wt 122.0 lb

## 2014-08-13 DIAGNOSIS — M8580 Other specified disorders of bone density and structure, unspecified site: Secondary | ICD-10-CM | POA: Diagnosis not present

## 2014-08-13 DIAGNOSIS — E559 Vitamin D deficiency, unspecified: Secondary | ICD-10-CM

## 2014-08-13 DIAGNOSIS — F329 Major depressive disorder, single episode, unspecified: Secondary | ICD-10-CM | POA: Diagnosis not present

## 2014-08-13 DIAGNOSIS — E039 Hypothyroidism, unspecified: Secondary | ICD-10-CM

## 2014-08-13 DIAGNOSIS — I10 Essential (primary) hypertension: Secondary | ICD-10-CM

## 2014-08-13 DIAGNOSIS — F411 Generalized anxiety disorder: Secondary | ICD-10-CM | POA: Diagnosis not present

## 2014-08-13 DIAGNOSIS — G894 Chronic pain syndrome: Secondary | ICD-10-CM | POA: Diagnosis not present

## 2014-08-13 DIAGNOSIS — M549 Dorsalgia, unspecified: Secondary | ICD-10-CM | POA: Diagnosis not present

## 2014-08-13 DIAGNOSIS — G4700 Insomnia, unspecified: Secondary | ICD-10-CM

## 2014-08-13 DIAGNOSIS — E785 Hyperlipidemia, unspecified: Secondary | ICD-10-CM | POA: Diagnosis not present

## 2014-08-13 DIAGNOSIS — F32A Depression, unspecified: Secondary | ICD-10-CM

## 2014-08-13 DIAGNOSIS — G8929 Other chronic pain: Secondary | ICD-10-CM

## 2014-08-13 MED ORDER — TRAMADOL HCL 50 MG PO TABS: 50.0000 mg | ORAL_TABLET | Freq: Four times a day (QID) | ORAL | Status: DC | PRN

## 2014-08-13 NOTE — Progress Notes (Signed)
Subjective:    Patient ID: Stacy Dalton, female    DOB: 07/26/1950, 64 y.o.   MRN: [id number]  Hypertension This is a chronic problem. The current episode started more than 1 year ago. The problem has been resolved since onset. The problem is controlled. Associated symptoms include anxiety. Pertinent negatives include no headaches, malaise/fatigue, palpitations, peripheral edema or shortness of breath. Risk factors for coronary artery disease include dyslipidemia and post-menopausal state. Past treatments include diuretics. The current treatment provides moderate improvement. There is no history of kidney disease, CAD/MI, CVA, heart failure or a thyroid problem. There is no history of sleep apnea.  Hyperlipidemia This is a chronic problem. The current episode started more than 1 year ago. The problem is uncontrolled. Recent lipid tests were reviewed and are normal. She has no history of diabetes or hypothyroidism. Associated symptoms include myalgias. Pertinent negatives include no leg pain or shortness of breath. Current antihyperlipidemic treatment includes diet change. The current treatment provides moderate improvement of lipids. Risk factors for coronary artery disease include dyslipidemia, family history, hypertension and post-menopausal.  Anxiety Presents for follow-up visit. Symptoms include depressed mood, insomnia and nervous/anxious behavior. Patient reports no hyperventilation, irritability, palpitations or shortness of breath. Primary symptoms comment: Pt's husband past away in 2015. Symptoms occur occasionally. The severity of symptoms is moderate. The symptoms are aggravated by family issues. The quality of sleep is good.   Her past medical history is significant for anxiety/panic attacks and depression. There is no history of CAD. Past treatments include SSRIs.  Thyroid Problem Presents for follow-up visit. Symptoms include anxiety, depressed mood and dry skin. Patient reports no  constipation, diarrhea, leg swelling or palpitations. The symptoms have been stable. Past treatments include levothyroxine. The treatment provided significant relief. Her past medical history is significant for hyperlipidemia. There is no history of diabetes or heart failure.      Review of Systems  Constitutional: Negative.  Negative for malaise/fatigue and irritability.  HENT: Negative.   Eyes: Negative.   Respiratory: Negative.  Negative for shortness of breath.   Cardiovascular: Negative.  Negative for palpitations.  Gastrointestinal: Negative.  Negative for diarrhea and constipation.  Endocrine: Negative.   Genitourinary: Negative.   Musculoskeletal: Positive for myalgias.  Neurological: Negative.  Negative for headaches.  Hematological: Negative.   Psychiatric/Behavioral: The patient is nervous/anxious and has insomnia.   All other systems reviewed and are negative.      Objective:   Physical Exam  Constitutional: She is oriented to person, place, and time. She appears well-developed and well-nourished. No distress.  HENT:  Head: Normocephalic and atraumatic.  Right Ear: External ear normal.  Mouth/Throat: Oropharynx is clear and moist.  Eyes: Pupils are equal, round, and reactive to light.  Neck: Normal range of motion. Neck supple. No thyromegaly present.  Cardiovascular: Normal rate, regular rhythm, normal heart sounds and intact distal pulses.   No murmur heard. Pulmonary/Chest: Effort normal and breath sounds normal. No respiratory distress. She has no wheezes.  Abdominal: Soft. Bowel sounds are normal. She exhibits no distension. There is no tenderness.  Musculoskeletal: Normal range of motion. She exhibits no edema or tenderness.  Neurological: She is alert and oriented to person, place, and time. She has normal reflexes. No cranial nerve deficit.  Skin: Skin is warm and dry.  Psychiatric: She has a normal mood and affect. Her behavior is normal. Judgment and  thought content normal.  Vitals reviewed.     BP 139/78 mmHg  Pulse 99  Temp(Src) 97.2 F (36.2 C) (Oral)  Ht _0  (1.549 m)  Wt 122 lb (55.339 kg)  BMI 23.06 kg/m2     Assessment & Plan:  1. Essential hypertension - CMP14+EGFR; Future  2. Hypothyroidism, unspecified hypothyroidism type - CMP14+EGFR; Future - TSH; Future  3. Insomnia - CMP14+EGFR; Future  4. HLD (hyperlipidemia) - CMP14+EGFR; Future  5. Depression - CMP14+EGFR; Future - Lipid panel; Future  6. GAD (generalized anxiety disorder) - CMP14+EGFR; Future  7. Vitamin D deficiency disease - CMP14+EGFR; Future - Vit D  25 hydroxy (rtn osteoporosis monitoring); Future  8. Osteopenia - CMP14+EGFR; Future  9. Chronic pain syndrome - CMP14+EGFR; Future - traMADol (ULTRAM) 50 MG tablet; Take 1 tablet (50 mg total) by mouth every 6 (six) hours as needed.  Dispense: 90 tablet; Refill: 3  10. Chronic back pain - CMP14+EGFR; Future - traMADol (ULTRAM) 50 MG tablet; Take 1 tablet (50 mg total) by mouth every 6 (six) hours as needed.  Dispense: 90 tablet; Refill: 3   Continue all meds Labs pending Health Maintenance reviewed Diet and exercise encouraged RTO 6 months   Evelina Dun, FNP

## 2014-08-13 NOTE — Patient Instructions (Signed)

## 2014-08-27 ENCOUNTER — Ambulatory Visit: Payer: Medicare Other | Admitting: Family Medicine

## 2014-08-28 ENCOUNTER — Ambulatory Visit: Payer: Medicare Other | Admitting: Family

## 2014-08-31 ENCOUNTER — Other Ambulatory Visit (INDEPENDENT_AMBULATORY_CARE_PROVIDER_SITE_OTHER): Payer: Medicare Other

## 2014-08-31 DIAGNOSIS — E559 Vitamin D deficiency, unspecified: Secondary | ICD-10-CM | POA: Diagnosis not present

## 2014-08-31 DIAGNOSIS — F411 Generalized anxiety disorder: Secondary | ICD-10-CM | POA: Diagnosis not present

## 2014-08-31 DIAGNOSIS — E785 Hyperlipidemia, unspecified: Secondary | ICD-10-CM | POA: Diagnosis not present

## 2014-08-31 DIAGNOSIS — G4700 Insomnia, unspecified: Secondary | ICD-10-CM | POA: Diagnosis not present

## 2014-08-31 DIAGNOSIS — G8929 Other chronic pain: Secondary | ICD-10-CM

## 2014-08-31 DIAGNOSIS — M549 Dorsalgia, unspecified: Secondary | ICD-10-CM | POA: Diagnosis not present

## 2014-08-31 DIAGNOSIS — G894 Chronic pain syndrome: Secondary | ICD-10-CM

## 2014-08-31 DIAGNOSIS — F32A Depression, unspecified: Secondary | ICD-10-CM

## 2014-08-31 DIAGNOSIS — E039 Hypothyroidism, unspecified: Secondary | ICD-10-CM

## 2014-08-31 DIAGNOSIS — I10 Essential (primary) hypertension: Secondary | ICD-10-CM | POA: Diagnosis not present

## 2014-08-31 DIAGNOSIS — M899 Disorder of bone, unspecified: Secondary | ICD-10-CM | POA: Diagnosis not present

## 2014-08-31 DIAGNOSIS — F329 Major depressive disorder, single episode, unspecified: Secondary | ICD-10-CM

## 2014-08-31 DIAGNOSIS — M8580 Other specified disorders of bone density and structure, unspecified site: Secondary | ICD-10-CM

## 2014-08-31 NOTE — Progress Notes (Signed)
Lab only 

## 2014-09-01 ENCOUNTER — Encounter: Payer: Self-pay | Admitting: Family

## 2014-09-01 ENCOUNTER — Other Ambulatory Visit: Payer: Self-pay | Admitting: Family

## 2014-09-01 LAB — CMP14+EGFR
ALT: 10 IU/L (ref 0–32)
AST: 20 IU/L (ref 0–40)
Albumin/Globulin Ratio: 2.2 (ref 1.1–2.5)
Albumin: 4.8 g/dL (ref 3.6–4.8)
Alkaline Phosphatase: 99 IU/L (ref 39–117)
BUN/Creatinine Ratio: 13 (ref 11–26)
BUN: 12 mg/dL (ref 8–27)
Bilirubin Total: 0.5 mg/dL (ref 0.0–1.2)
CO2: 23 mmol/L (ref 18–29)
Calcium: 10.2 mg/dL (ref 8.7–10.3)
Chloride: 93 mmol/L — ABNORMAL LOW (ref 97–108)
Creatinine, Ser: 0.95 mg/dL (ref 0.57–1.00)
GFR calc Af Amer: 72 mL/min/{1.73_m2} (ref >59)
GFR calc non Af Amer: 63 mL/min/{1.73_m2} (ref >59)
Globulin, Total: 2.2 g/dL (ref 1.5–4.5)
Glucose: 104 mg/dL — ABNORMAL HIGH (ref 65–99)
Potassium: 4.3 mmol/L (ref 3.5–5.2)
Sodium: 133 mmol/L — ABNORMAL LOW (ref 134–144)
Total Protein: 7 g/dL (ref 6.0–8.5)

## 2014-09-01 LAB — LIPID PANEL
Chol/HDL Ratio: 3.8 ratio (ref 0.0–4.4)
Cholesterol, Total: 252 mg/dL — ABNORMAL HIGH (ref 100–199)
HDL: 67 mg/dL
LDL Calculated: 166 mg/dL — ABNORMAL HIGH (ref 0–99)
Triglycerides: 95 mg/dL (ref 0–149)
VLDL Cholesterol Cal: 19 mg/dL (ref 5–40)

## 2014-09-01 LAB — VITAMIN D 25 HYDROXY (VIT D DEFICIENCY, FRACTURES): Vit D, 25-Hydroxy: 27.8 ng/mL — ABNORMAL LOW (ref 30.0–100.0)

## 2014-09-01 LAB — TSH: TSH: 3.18 u[IU]/mL (ref 0.450–4.500)

## 2014-09-01 MED ORDER — ATORVASTATIN CALCIUM 40 MG PO TABS: 40.0000 mg | ORAL_TABLET | Freq: Every day | ORAL | Status: DC

## 2014-09-01 MED ORDER — VITAMIN D (ERGOCALCIFEROL) 1.25 MG (50000 UNIT) PO CAPS: 50000.0000 [IU] | ORAL_CAPSULE | ORAL | Status: DC

## 2014-09-03 ENCOUNTER — Telehealth: Payer: Self-pay | Admitting: Family

## 2014-09-03 MED ORDER — ATORVASTATIN CALCIUM 40 MG PO TABS: 40.0000 mg | ORAL_TABLET | Freq: Every day | ORAL | Status: DC

## 2014-09-03 MED ORDER — VITAMIN D (ERGOCALCIFEROL) 1.25 MG (50000 UNIT) PO CAPS: 50000.0000 [IU] | ORAL_CAPSULE | ORAL | Status: DC

## 2014-09-03 NOTE — Telephone Encounter (Signed)
[facility] didn't want to fill her Lipitor or Vit D because there wasn't a supervising doctor's name and NPI number listed. Resent prescriptions electronically with this information.

## 2014-09-22 ENCOUNTER — Other Ambulatory Visit: Payer: Self-pay | Admitting: Family Medicine

## 2014-09-29 ENCOUNTER — Ambulatory Visit (INDEPENDENT_AMBULATORY_CARE_PROVIDER_SITE_OTHER): Payer: Medicare Other | Admitting: Family

## 2014-09-29 ENCOUNTER — Encounter: Payer: Self-pay | Admitting: Family

## 2014-09-29 VITALS — BP 128/71 | HR 84 | Temp 98.3°F | Ht 61.0 in | Wt 120.0 lb

## 2014-09-29 DIAGNOSIS — R319 Hematuria, unspecified: Secondary | ICD-10-CM

## 2014-09-29 DIAGNOSIS — R110 Nausea: Secondary | ICD-10-CM | POA: Diagnosis not present

## 2014-09-29 DIAGNOSIS — R309 Painful micturition, unspecified: Secondary | ICD-10-CM

## 2014-09-29 DIAGNOSIS — R300 Dysuria: Secondary | ICD-10-CM | POA: Diagnosis not present

## 2014-09-29 DIAGNOSIS — N390 Urinary tract infection, site not specified: Secondary | ICD-10-CM | POA: Diagnosis not present

## 2014-09-29 LAB — POCT UA - MICROSCOPIC ONLY
Casts, Ur, LPF, POC: NEGATIVE
Crystals, Ur, HPF, POC: NEGATIVE
Mucus, UA: NEGATIVE
Yeast, UA: NEGATIVE

## 2014-09-29 LAB — POCT URINALYSIS DIPSTICK
Bilirubin, UA: NEGATIVE
Glucose, UA: NEGATIVE
Ketones, UA: NEGATIVE
Nitrite, UA: NEGATIVE
Protein, UA: NEGATIVE
Spec Grav, UA: 1.01
Urobilinogen, UA: NEGATIVE
pH, UA: 6

## 2014-09-29 MED ORDER — SULFAMETHOXAZOLE-TRIMETHOPRIM 800-160 MG PO TABS: 1.0000 | ORAL_TABLET | Freq: Two times a day (BID) | ORAL | Status: DC

## 2014-09-29 MED ORDER — PROMETHAZINE HCL 25 MG PO TABS: 25.0000 mg | ORAL_TABLET | Freq: Three times a day (TID) | ORAL | Status: DC | PRN

## 2014-09-29 NOTE — Patient Instructions (Signed)

## 2014-09-29 NOTE — Progress Notes (Signed)
   Subjective:    Patient ID: Stacy Dalton, female    DOB: March 10, 1951, 64 y.o.   MRN: [id number]  Urinary Tract Infection  This is a new problem. The current episode started in the past 7 days. The problem occurs every urination. The problem has been gradually worsening. The quality of the pain is described as burning and shooting. The pain is at a severity of 8/10. The pain is moderate. There has been no fever. Associated symptoms include flank pain, nausea and urgency. Pertinent negatives include no chills, discharge, frequency, hematuria, hesitancy or vomiting. She has tried increased fluids for the symptoms. The treatment provided mild relief.      Review of Systems  Constitutional: Negative.  Negative for chills.  HENT: Negative.   Eyes: Negative.   Respiratory: Negative.  Negative for shortness of breath.   Cardiovascular: Negative.  Negative for palpitations.  Gastrointestinal: Positive for nausea. Negative for vomiting.  Endocrine: Negative.   Genitourinary: Positive for urgency and flank pain. Negative for hesitancy, frequency and hematuria.  Musculoskeletal: Negative.   Neurological: Negative.  Negative for headaches.  Hematological: Negative.   Psychiatric/Behavioral: Negative.   All other systems reviewed and are negative.      Objective:   Physical Exam  Constitutional: She is oriented to person, place, and time. She appears well-developed and well-nourished. No distress.  Eyes: Pupils are equal, round, and reactive to light.  Neck: Normal range of motion. Neck supple. No thyromegaly present.  Cardiovascular: Normal rate, regular rhythm, normal heart sounds and intact distal pulses.   No murmur heard. Pulmonary/Chest: Effort normal and breath sounds normal. No respiratory distress. She has no wheezes.  Abdominal: Soft. Bowel sounds are normal. She exhibits no distension. There is tenderness (mild tenderness in lower abd).  Musculoskeletal: Normal range of motion.  She exhibits no edema or tenderness.  Negative for CVA tenderness   Neurological: She is alert and oriented to person, place, and time. She has normal reflexes. No cranial nerve deficit.  Skin: Skin is warm and dry.  Psychiatric: She has a normal mood and affect. Her behavior is normal. Judgment and thought content normal.  Vitals reviewed.     BP 128/71 mmHg  Pulse 84  Temp(Src) 98.3 F (36.8 C) (Oral)  Ht 5\' 1"  (1.549 m)  Wt 120 lb (54.432 kg)  BMI 22.69 kg/m2     Assessment & Plan:  1. Pain on voiding - POCT urinalysis dipstick - POCT UA - Microscopic Only - Urine culture - sulfamethoxazole-trimethoprim (BACTRIM DS,SEPTRA DS) 800-160 MG per tablet; Take 1 tablet by mouth 2 (two) times daily.  Dispense: 10 tablet; Refill: 0  2. Nausea without vomiting - sulfamethoxazole-trimethoprim (BACTRIM DS,SEPTRA DS) 800-160 MG per tablet; Take 1 tablet by mouth 2 (two) times daily.  Dispense: 10 tablet; Refill: 0 - promethazine (PHENERGAN) 25 MG tablet; Take 1 tablet (25 mg total) by mouth every 8 (eight) hours as needed for nausea or vomiting.  Dispense: 20 tablet; Refill: 0  3. Urinary tract infection with hematuria, site unspecified -Force fluids AZO over the counter X2 days RTO prn Culture pending - sulfamethoxazole-trimethoprim (BACTRIM DS,SEPTRA DS) 800-160 MG per tablet; Take 1 tablet by mouth 2 (two) times daily.  Dispense: 10 tablet; Refill: 0  Evelina Dun, FNP

## 2014-09-30 LAB — URINE CULTURE

## 2014-10-26 ENCOUNTER — Telehealth: Payer: Self-pay | Admitting: Family

## 2014-10-26 DIAGNOSIS — M549 Dorsalgia, unspecified: Secondary | ICD-10-CM

## 2014-10-26 DIAGNOSIS — G8929 Other chronic pain: Secondary | ICD-10-CM

## 2014-10-26 DIAGNOSIS — G894 Chronic pain syndrome: Secondary | ICD-10-CM

## 2014-10-26 MED ORDER — TRAMADOL HCL 50 MG PO TABS
50.0000 mg | ORAL_TABLET | Freq: Four times a day (QID) | ORAL | Status: DC | PRN
Start: 2014-10-26 — End: 2015-01-13

## 2014-10-26 NOTE — Telephone Encounter (Signed)
Informed pt to go ahead and contact mail order pharmacy and have them send a request for refills and that these should go back to the mail order pharmacy

## 2014-10-26 NOTE — Telephone Encounter (Signed)
Pt aware to pick up rx 

## 2014-10-26 NOTE — Telephone Encounter (Signed)
RX ready for pick up 

## 2014-10-27 NOTE — Telephone Encounter (Signed)
Stp and advised we were calling to tell her that her tramadol rx was ready to be picked up.

## 2014-11-16 ENCOUNTER — Telehealth: Payer: Self-pay | Admitting: Family

## 2014-11-16 NOTE — Telephone Encounter (Signed)
Patient states that she has been feeling very stressed for the last few day. Her Bp this morning 160/98 and the last few days and she wants to know if you recommend if she should make any changes please advise

## 2014-11-16 NOTE — Telephone Encounter (Signed)
What medication was she taking? Why was it stopped?

## 2014-11-16 NOTE — Telephone Encounter (Signed)
Appointment given for Wednesday with Stacy Dalton to discuss.

## 2014-11-17 ENCOUNTER — Ambulatory Visit: Payer: Medicare Other | Admitting: Family

## 2014-11-18 ENCOUNTER — Encounter: Payer: Self-pay | Admitting: Family

## 2014-11-18 ENCOUNTER — Ambulatory Visit (INDEPENDENT_AMBULATORY_CARE_PROVIDER_SITE_OTHER): Payer: Medicare Other | Admitting: Family

## 2014-11-18 VITALS — BP 127/71 | HR 87 | Temp 98.1°F | Ht 61.0 in | Wt 117.4 lb

## 2014-11-18 DIAGNOSIS — I10 Essential (primary) hypertension: Secondary | ICD-10-CM

## 2014-11-18 DIAGNOSIS — F411 Generalized anxiety disorder: Secondary | ICD-10-CM | POA: Diagnosis not present

## 2014-11-18 MED ORDER — ALPRAZOLAM 0.5 MG PO TABS: 0.5000 mg | ORAL_TABLET | Freq: Every evening | ORAL | Status: DC | PRN

## 2014-11-18 NOTE — Progress Notes (Signed)
   Subjective:    Patient ID: Stacy Dalton, female    DOB: 1951/01/26, 64 y.o.   MRN: [id number]  Hypertension This is a chronic problem. The current episode started more than 1 year ago. The problem has been waxing and waning since onset. The problem is uncontrolled. Associated symptoms include anxiety. Pertinent negatives include no headaches, malaise/fatigue, palpitations, peripheral edema or shortness of breath. Risk factors for coronary artery disease include dyslipidemia and post-menopausal state. Past treatments include calcium channel blockers, angiotensin blockers and diuretics. The current treatment provides significant improvement. Hypertensive end-organ damage includes a thyroid problem. There is no history of kidney disease, CAD/MI, CVA or heart failure.      Review of Systems  Constitutional: Negative.  Negative for malaise/fatigue.  HENT: Negative.   Eyes: Negative.   Respiratory: Negative.  Negative for shortness of breath.   Cardiovascular: Negative.  Negative for palpitations.  Gastrointestinal: Negative.   Endocrine: Negative.   Genitourinary: Negative.   Musculoskeletal: Negative.   Neurological: Negative.  Negative for headaches.  Hematological: Negative.   Psychiatric/Behavioral: Negative.   All other systems reviewed and are negative.      Objective:   Physical Exam  Constitutional: She is oriented to person, place, and time. She appears well-developed and well-nourished. No distress.  HENT:  Head: Normocephalic and atraumatic.  Eyes: Pupils are equal, round, and reactive to light.  Neck: Normal range of motion. Neck supple. No thyromegaly present.  Cardiovascular: Normal rate, regular rhythm, normal heart sounds and intact distal pulses.   No murmur heard. Pulmonary/Chest: Effort normal and breath sounds normal. No respiratory distress. She has no wheezes.  Abdominal: Soft. Bowel sounds are normal. She exhibits no distension. There is no tenderness.    Musculoskeletal: Normal range of motion. She exhibits no edema or tenderness.  Neurological: She is alert and oriented to person, place, and time. She has normal reflexes. No cranial nerve deficit.  Skin: Skin is warm and dry.  Psychiatric: She has a normal mood and affect. Her behavior is normal. Judgment and thought content normal.  Vitals reviewed.     BP 127/71 mmHg  Pulse 87  Temp(Src) 98.1 F (36.7 C) (Oral)  Ht 5\' 1"  (1.549 m)  Wt 117 lb 6.4 oz (53.252 kg)  BMI 22.19 kg/m2     Assessment & Plan:  1. Essential hypertension - ALPRAZolam (XANAX) 0.5 MG tablet; Take 1 tablet (0.5 mg total) by mouth at bedtime as needed for anxiety.  Dispense: 60 tablet; Refill: 3  2. GAD (generalized anxiety disorder) -Stress management - ALPRAZolam (XANAX) 0.5 MG tablet; Take 1 tablet (0.5 mg total) by mouth at bedtime as needed for anxiety.  Dispense: 60 tablet; Refill: 3   Continue all meds Health Maintenance reviewed Diet and exercise encouraged RTO as needed and keep chronic follow up  Evelina Dun, FNP

## 2014-11-18 NOTE — Patient Instructions (Signed)
Generalized Anxiety Disorder Generalized anxiety disorder (GAD) is a mental disorder. It interferes with life functions, including relationships, work, and school. GAD is different from normal anxiety, which everyone experiences at some point in their lives in response to specific life events and activities. Normal anxiety actually helps us prepare for and get through these life events and activities. Normal anxiety goes away after the event or activity is over.  GAD causes anxiety that is not necessarily related to specific events or activities. It also causes excess anxiety in proportion to specific events or activities. The anxiety associated with GAD is also difficult to control. GAD can vary from mild to severe. People with severe GAD can have intense waves of anxiety with physical symptoms (panic attacks).  SYMPTOMS The anxiety and worry associated with GAD are difficult to control. This anxiety and worry are related to many life events and activities and also occur more days than not for 6 months or longer. People with GAD also have three or more of the following symptoms (one or more in children):  Restlessness.   Fatigue.  Difficulty concentrating.   Irritability.  Muscle tension.  Difficulty sleeping or unsatisfying sleep. DIAGNOSIS GAD is diagnosed through an assessment by your health care provider. Your health care provider will ask you questions aboutyour mood,physical symptoms, and events in your life. Your health care provider may ask you about your medical history and use of alcohol or drugs, including prescription medicines. Your health care provider may also do a physical exam and blood tests. Certain medical conditions and the use of certain substances can cause symptoms similar to those associated with GAD. Your health care provider may refer you to a mental health specialist for further evaluation. TREATMENT The following therapies are usually used to treat GAD:    Medication. Antidepressant medication usually is prescribed for long-term daily control. Antianxiety medicines may be added in severe cases, especially when panic attacks occur.   Talk therapy (psychotherapy). Certain types of talk therapy can be helpful in treating GAD by providing support, education, and guidance. A form of talk therapy called cognitive behavioral therapy can teach you healthy ways to think about and react to daily life events and activities.  Stress managementtechniques. These include yoga, meditation, and exercise and can be very helpful when they are practiced regularly. A mental health specialist can help determine which treatment is best for you. Some people see improvement with one therapy. However, other people require a combination of therapies. Document Released: 10/14/2012 Document Revised: 11/03/2013 Document Reviewed: 10/14/2012 Exit[employer] Patient Information 2015 [employer]. This information is not intended to replace advice given to you by your health care provider. Make sure you discuss any questions you have with your health care provider.  

## 2014-12-09 ENCOUNTER — Other Ambulatory Visit: Payer: Self-pay | Admitting: Family Medicine

## 2015-01-13 ENCOUNTER — Telehealth: Payer: Self-pay | Admitting: Family

## 2015-01-13 DIAGNOSIS — M549 Dorsalgia, unspecified: Secondary | ICD-10-CM

## 2015-01-13 DIAGNOSIS — G8929 Other chronic pain: Secondary | ICD-10-CM

## 2015-01-13 DIAGNOSIS — G894 Chronic pain syndrome: Secondary | ICD-10-CM

## 2015-01-13 MED ORDER — TRAMADOL HCL 50 MG PO TABS: 50.0000 mg | ORAL_TABLET | Freq: Four times a day (QID) | ORAL | Status: DC | PRN

## 2015-01-13 NOTE — Telephone Encounter (Signed)
Pt notified of RX 

## 2015-01-13 NOTE — Telephone Encounter (Signed)
RX ready for pick up 

## 2015-01-21 ENCOUNTER — Telehealth: Payer: Self-pay | Admitting: Family

## 2015-01-21 NOTE — Telephone Encounter (Signed)
Pt gets fever blisters on lip occasionally, but has one now that is more on her cheek. She states that she has had one like this once in the past and MD said it was a fever blister...  Would could she use on this area? otc cream or would she need something stronger, RX, Etc.Marland KitchenMarland KitchenMarland Kitchen

## 2015-01-21 NOTE — Telephone Encounter (Signed)
If she has been told in the past this was a fever blisters she could try OTC products and if does not improve she could be seen to get rx

## 2015-01-21 NOTE — Telephone Encounter (Signed)
Pt aware.

## 2015-02-09 ENCOUNTER — Other Ambulatory Visit: Payer: Self-pay | Admitting: Family Medicine

## 2015-02-12 ENCOUNTER — Ambulatory Visit: Payer: Medicare Other | Admitting: Family

## 2015-02-12 ENCOUNTER — Ambulatory Visit (INDEPENDENT_AMBULATORY_CARE_PROVIDER_SITE_OTHER): Payer: Medicare Other | Admitting: Family

## 2015-02-12 ENCOUNTER — Encounter: Payer: Self-pay | Admitting: Family

## 2015-02-12 VITALS — BP 138/83 | HR 73 | Temp 97.7°F | Ht 61.0 in | Wt 130.0 lb

## 2015-02-12 DIAGNOSIS — G4700 Insomnia, unspecified: Secondary | ICD-10-CM

## 2015-02-12 DIAGNOSIS — D649 Anemia, unspecified: Secondary | ICD-10-CM

## 2015-02-12 DIAGNOSIS — E785 Hyperlipidemia, unspecified: Secondary | ICD-10-CM

## 2015-02-12 DIAGNOSIS — F32A Depression, unspecified: Secondary | ICD-10-CM

## 2015-02-12 DIAGNOSIS — M8580 Other specified disorders of bone density and structure, unspecified site: Secondary | ICD-10-CM | POA: Diagnosis not present

## 2015-02-12 DIAGNOSIS — M549 Dorsalgia, unspecified: Secondary | ICD-10-CM | POA: Diagnosis not present

## 2015-02-12 DIAGNOSIS — I10 Essential (primary) hypertension: Secondary | ICD-10-CM

## 2015-02-12 DIAGNOSIS — E559 Vitamin D deficiency, unspecified: Secondary | ICD-10-CM

## 2015-02-12 DIAGNOSIS — E039 Hypothyroidism, unspecified: Secondary | ICD-10-CM

## 2015-02-12 DIAGNOSIS — G894 Chronic pain syndrome: Secondary | ICD-10-CM | POA: Diagnosis not present

## 2015-02-12 DIAGNOSIS — G8929 Other chronic pain: Secondary | ICD-10-CM | POA: Diagnosis not present

## 2015-02-12 DIAGNOSIS — R5383 Other fatigue: Secondary | ICD-10-CM | POA: Diagnosis not present

## 2015-02-12 DIAGNOSIS — F329 Major depressive disorder, single episode, unspecified: Secondary | ICD-10-CM

## 2015-02-12 DIAGNOSIS — F411 Generalized anxiety disorder: Secondary | ICD-10-CM | POA: Diagnosis not present

## 2015-02-12 MED ORDER — ZOLPIDEM TARTRATE ER 12.5 MG PO TBCR: EXTENDED_RELEASE_TABLET | ORAL | Status: DC

## 2015-02-12 MED ORDER — TRAMADOL HCL 50 MG PO TABS: 50.0000 mg | ORAL_TABLET | Freq: Four times a day (QID) | ORAL | Status: DC | PRN

## 2015-02-12 MED ORDER — ALPRAZOLAM 0.5 MG PO TABS: 0.5000 mg | ORAL_TABLET | Freq: Every evening | ORAL | Status: DC | PRN

## 2015-02-12 NOTE — Patient Instructions (Signed)

## 2015-02-12 NOTE — Progress Notes (Signed)
Subjective:    Patient ID: Stacy Dalton, female    DOB: 19-Nov-1950, 64 y.o.   MRN: [id number]  Pt presents to the office for chronic follow up.  Hyperlipidemia This is a chronic problem. The current episode started more than 1 year ago. The problem is uncontrolled. Recent lipid tests were reviewed and are high. She has no history of diabetes or hypothyroidism. Associated symptoms include myalgias. Pertinent negatives include no leg pain or shortness of breath. Current antihyperlipidemic treatment includes diet change and statins. The current treatment provides moderate improvement of lipids. Risk factors for coronary artery disease include dyslipidemia, family history, hypertension and post-menopausal.  Hypertension This is a chronic problem. The current episode started more than 1 year ago. The problem has been waxing and waning since onset. The problem is uncontrolled. Associated symptoms include anxiety. Pertinent negatives include no headaches, malaise/fatigue, palpitations, peripheral edema or shortness of breath. Risk factors for coronary artery disease include dyslipidemia and post-menopausal state. Past treatments include diuretics. The current treatment provides moderate improvement. Hypertensive end-organ damage includes a thyroid problem. There is no history of kidney disease, CAD/MI, CVA or heart failure. There is no history of sleep apnea.  Anxiety Presents for follow-up visit. Onset was 1 to 6 months ago. The problem has been waxing and waning. Symptoms include depressed mood, excessive worry, insomnia and nervous/anxious behavior. Patient reports no hyperventilation, irritability, palpitations or shortness of breath. Primary symptoms comment: Pt's husband past away in 2015. Symptoms occur occasionally. The severity of symptoms is moderate. The symptoms are aggravated by family issues. The quality of sleep is good.   Her past medical history is significant for anxiety/panic attacks  and depression. There is no history of CAD. Past treatments include SSRIs.  Thyroid Problem Presents for follow-up visit. Symptoms include anxiety, depressed mood and dry skin. Patient reports no constipation, diarrhea, leg swelling or palpitations. The symptoms have been stable. Past treatments include levothyroxine. The treatment provided significant relief. Her past medical history is significant for hyperlipidemia. There is no history of diabetes or heart failure.  Back Pain This is a chronic problem. The current episode started more than 1 year ago. The problem occurs constantly. The problem has been waxing and waning since onset. The pain is present in the thoracic spine. The quality of the pain is described as aching. The pain is at a severity of 9/10. The pain is moderate. Pertinent negatives include no bladder incontinence, bowel incontinence, headaches or leg pain. She has tried analgesics for the symptoms. The treatment provided mild relief.      Review of Systems  Constitutional: Negative.  Negative for malaise/fatigue and irritability.  HENT: Negative.   Eyes: Negative.   Respiratory: Negative.  Negative for shortness of breath.   Cardiovascular: Negative.  Negative for palpitations.  Gastrointestinal: Negative.  Negative for diarrhea, constipation and bowel incontinence.  Endocrine: Negative.   Genitourinary: Negative.  Negative for bladder incontinence.  Musculoskeletal: Positive for myalgias and back pain.  Neurological: Negative.  Negative for headaches.  Hematological: Negative.   Psychiatric/Behavioral: The patient is nervous/anxious and has insomnia.   All other systems reviewed and are negative.      Objective:   Physical Exam  Constitutional: She is oriented to person, place, and time. She appears well-developed and well-nourished. No distress.  HENT:  Head: Normocephalic and atraumatic.  Right Ear: External ear normal.  Left Ear: External ear normal.  Nose:  Nose normal.  Mouth/Throat: Oropharynx is clear and moist.  Eyes: Pupils  are equal, round, and reactive to light.  Neck: Normal range of motion. Neck supple. No thyromegaly present.  Cardiovascular: Normal rate, regular rhythm, normal heart sounds and intact distal pulses.   No murmur heard. Pulmonary/Chest: Effort normal and breath sounds normal. No respiratory distress. She has no wheezes.  Abdominal: Soft. Bowel sounds are normal. She exhibits no distension. There is no tenderness.  Musculoskeletal: Normal range of motion. She exhibits no edema or tenderness.  Neurological: She is alert and oriented to person, place, and time. She has normal reflexes. No cranial nerve deficit.  Skin: Skin is warm and dry.  Psychiatric: She has a normal mood and affect. Her behavior is normal. Judgment and thought content normal.  Vitals reviewed.   BP 146/81 mmHg  Pulse 79  Temp(Src) 97.7 F (36.5 C) (Oral)  Ht 5' 1"  (1.549 m)  Wt 130 lb (58.968 kg)  BMI 24.58 kg/m2       Assessment & Plan:  1. Essential hypertension - CMP14+EGFR - ALPRAZolam (XANAX) 0.5 MG tablet; Take 1 tablet (0.5 mg total) by mouth at bedtime as needed for anxiety.  Dispense: 60 tablet; Refill: 3  2. Vitamin D deficiency disease - CMP14+EGFR - Vit D  25 hydroxy (rtn osteoporosis monitoring)  3. Normocytic anemia - CMP14+EGFR - Anemia Profile B  4. HLD (hyperlipidemia) - CMP14+EGFR - Lipid panel  5. GAD (generalized anxiety disorder) - CMP14+EGFR - ALPRAZolam (XANAX) 0.5 MG tablet; Take 1 tablet (0.5 mg total) by mouth at bedtime as needed for anxiety.  Dispense: 60 tablet; Refill: 3  6. Depression - CMP14+EGFR - ALPRAZolam (XANAX) 0.5 MG tablet; Take 1 tablet (0.5 mg total) by mouth at bedtime as needed for anxiety.  Dispense: 60 tablet; Refill: 3  7. Insomnia - CMP14+EGFR - zolpidem (AMBIEN CR) 12.5 MG CR tablet; TAKE 1 TABLET DAILY AT BEDTIME AS NEEDED  Dispense: 90 tablet; Refill: 3  8. Hypothyroidism,  unspecified hypothyroidism type - CMP14+EGFR - Thyroid Panel With TSH  9. Osteopenia - CMP14+EGFR  10. Chronic back pain - CMP14+EGFR - traMADol (ULTRAM) 50 MG tablet; Take 1 tablet (50 mg total) by mouth every 6 (six) hours as needed.  Dispense: 90 tablet; Refill: 3  11. Chronic pain syndrome  - CMP14+EGFR - traMADol (ULTRAM) 50 MG tablet; Take 1 tablet (50 mg total) by mouth every 6 (six) hours as needed.  Dispense: 90 tablet; Refill: 3   Continue all meds Labs pending Health Maintenance reviewed-Pt refuses all vaccines at this time Diet and exercise encouraged RTO 6 months  Evelina Dun, FNP

## 2015-02-13 LAB — LIPID PANEL
Chol/HDL Ratio: 1.9 ratio units (ref 0.0–4.4)
Cholesterol, Total: 145 mg/dL (ref 100–199)
HDL: 75 mg/dL (ref >39)
LDL Calculated: 56 mg/dL (ref 0–99)
Triglycerides: 68 mg/dL (ref 0–149)
VLDL Cholesterol Cal: 14 mg/dL (ref 5–40)

## 2015-02-13 LAB — ANEMIA PROFILE B
Basophils Absolute: 0 10*3/uL (ref 0.0–0.2)
Basos: 1 %
EOS (ABSOLUTE): 0.2 10*3/uL (ref 0.0–0.4)
Eos: 4 %
Ferritin: 26 ng/mL (ref 15–150)
Folate: 16.6 ng/mL (ref >3.0)
Hematocrit: 39.9 % (ref 34.0–46.6)
Hemoglobin: 13.5 g/dL (ref 11.1–15.9)
Immature Grans (Abs): 0 10*3/uL (ref 0.0–0.1)
Immature Granulocytes: 0 %
Iron Saturation: 22 % (ref 15–55)
Iron: 79 ug/dL (ref 27–139)
Lymphocytes Absolute: 2 10*3/uL (ref 0.7–3.1)
Lymphs: 32 %
MCH: 29.8 pg (ref 26.6–33.0)
MCHC: 33.8 g/dL (ref 31.5–35.7)
MCV: 88 fL (ref 79–97)
Monocytes Absolute: 0.7 10*3/uL (ref 0.1–0.9)
Monocytes: 11 %
Neutrophils Absolute: 3.3 10*3/uL (ref 1.4–7.0)
Neutrophils: 52 %
Platelets: 298 10*3/uL (ref 150–379)
RBC: 4.53 x10E6/uL (ref 3.77–5.28)
RDW: 13.6 % (ref 12.3–15.4)
Retic Ct Pct: 0.7 % (ref 0.6–2.6)
Total Iron Binding Capacity: 355 ug/dL (ref 250–450)
UIBC: 276 ug/dL (ref 118–369)
Vitamin B-12: 370 pg/mL (ref 211–946)
WBC: 6.3 10*3/uL (ref 3.4–10.8)

## 2015-02-13 LAB — CMP14+EGFR
ALT: 15 IU/L (ref 0–32)
AST: 19 IU/L (ref 0–40)
Albumin/Globulin Ratio: 2.3 (ref 1.1–2.5)
Albumin: 4.6 g/dL (ref 3.6–4.8)
Alkaline Phosphatase: 81 IU/L (ref 39–117)
BUN/Creatinine Ratio: 13 (ref 11–26)
BUN: 9 mg/dL (ref 8–27)
Bilirubin Total: 0.4 mg/dL (ref 0.0–1.2)
CO2: 29 mmol/L (ref 18–29)
Calcium: 9.6 mg/dL (ref 8.7–10.3)
Chloride: 96 mmol/L — ABNORMAL LOW (ref 97–108)
Creatinine, Ser: 0.7 mg/dL (ref 0.57–1.00)
GFR calc Af Amer: 104 mL/min/{1.73_m2} (ref >59)
GFR calc non Af Amer: 91 mL/min/{1.73_m2} (ref >59)
Globulin, Total: 2 g/dL (ref 1.5–4.5)
Glucose: 79 mg/dL (ref 65–99)
Potassium: 3.7 mmol/L (ref 3.5–5.2)
Sodium: 141 mmol/L (ref 134–144)
Total Protein: 6.6 g/dL (ref 6.0–8.5)

## 2015-02-13 LAB — THYROID PANEL WITH TSH
Free Thyroxine Index: 2 (ref 1.2–4.9)
T3 Uptake Ratio: 24 % (ref 24–39)
T4, Total: 8.4 ug/dL (ref 4.5–12.0)
TSH: 3.48 u[IU]/mL (ref 0.450–4.500)

## 2015-02-13 LAB — VITAMIN D 25 HYDROXY (VIT D DEFICIENCY, FRACTURES): Vit D, 25-Hydroxy: 29.9 ng/mL — ABNORMAL LOW (ref 30.0–100.0)

## 2015-03-02 ENCOUNTER — Telehealth: Payer: Self-pay | Admitting: Family

## 2015-03-02 MED ORDER — ONDANSETRON HCL 4 MG PO TABS: 4.0000 mg | ORAL_TABLET | Freq: Three times a day (TID) | ORAL | Status: DC | PRN

## 2015-03-02 NOTE — Telephone Encounter (Signed)
Zofran Prescription sent to pharmacy  ° °

## 2015-03-03 ENCOUNTER — Other Ambulatory Visit: Payer: Self-pay | Admitting: Family

## 2015-03-03 NOTE — Telephone Encounter (Signed)
Detailed message left for patient that rx has been sent to pharmacy.

## 2015-03-03 NOTE — Telephone Encounter (Signed)
Zofran rx was sent in yesterday

## 2015-03-04 ENCOUNTER — Telehealth: Payer: Self-pay | Admitting: *Deleted

## 2015-03-04 NOTE — Telephone Encounter (Signed)
Called to stop shipment of Zofran script to patient.  It had been sent already. They will flag that medicine to be sure it does not get sent through mail again.  New script for Zofran was given to local pharmacist who called saying patient had requested it from them.

## 2015-03-05 ENCOUNTER — Telehealth: Payer: Self-pay | Admitting: Family

## 2015-03-10 ENCOUNTER — Encounter: Payer: Self-pay | Admitting: Physician Assistant

## 2015-03-10 ENCOUNTER — Ambulatory Visit (INDEPENDENT_AMBULATORY_CARE_PROVIDER_SITE_OTHER): Payer: Medicare Other | Admitting: Physician Assistant

## 2015-03-10 VITALS — BP 130/69 | HR 82 | Temp 97.6°F | Ht 61.0 in | Wt 127.2 lb

## 2015-03-10 DIAGNOSIS — R399 Unspecified symptoms and signs involving the genitourinary system: Secondary | ICD-10-CM

## 2015-03-10 DIAGNOSIS — R151 Fecal smearing: Secondary | ICD-10-CM | POA: Diagnosis not present

## 2015-03-10 DIAGNOSIS — N3090 Cystitis, unspecified without hematuria: Secondary | ICD-10-CM | POA: Diagnosis not present

## 2015-03-10 LAB — POCT UA - MICROSCOPIC ONLY
Casts, Ur, LPF, POC: NEGATIVE
Crystals, Ur, HPF, POC: NEGATIVE
Mucus, UA: NEGATIVE
Yeast, UA: NEGATIVE

## 2015-03-10 LAB — POCT URINALYSIS DIPSTICK
Bilirubin, UA: NEGATIVE
Glucose, UA: NEGATIVE
Ketones, UA: NEGATIVE
Nitrite, UA: POSITIVE
Spec Grav, UA: 1.01
Urobilinogen, UA: NEGATIVE
pH, UA: 8

## 2015-03-10 MED ORDER — CIPROFLOXACIN HCL 500 MG PO TABS: 500.0000 mg | ORAL_TABLET | Freq: Two times a day (BID) | ORAL | Status: DC

## 2015-03-10 NOTE — Progress Notes (Signed)
   Subjective:    Patient ID: Stacy Dalton, female    DOB: March 18, 1951, 64 y.o.   MRN: [id number]  HPI 64 y/o female presents with c/o burning with urination x 3 days.   She also has c/o stool leakage in her underwear and would like a referral to a GI. She had a colonoscopy in 2013, which was WNL. She became septic at that time d/t bowels spilling out in her abdomen and wore colostomy x 6 months.   She also has c/o pruritic rash on abdomen since yesterday. She uses Dial soap.   Review of Systems  Constitutional: Negative.   HENT: Negative.   Eyes: Negative.   Respiratory: Negative.   Cardiovascular: Negative.   Gastrointestinal: Negative.   Endocrine: Positive for polyuria.  Genitourinary: Positive for dysuria, urgency, frequency and pelvic pain. Negative for hematuria and vaginal discharge.  Musculoskeletal: Positive for back pain (chronic ).  Neurological: Negative.        Objective:   Physical Exam  Constitutional: She is oriented to person, place, and time. She appears well-developed and well-nourished. No distress.  Abdominal: She exhibits no distension and no mass. There is tenderness. There is no rebound and no guarding.  Neurological: She is alert and oriented to person, place, and time.  Skin: She is not diaphoretic.  Psychiatric: She has a normal mood and affect. Her behavior is normal. Judgment and thought content normal.  Nursing note and vitals reviewed.  Results for orders placed or performed in visit on 03/10/15  POCT urinalysis dipstick  Result Value Ref Range   Color, UA gold    Clarity, UA cloudy    Glucose, UA neg    Bilirubin, UA neg    Ketones, UA neg    Spec Grav, UA 1.010    Blood, UA large    pH, UA 8.0    Protein, UA 1+    Urobilinogen, UA negative    Nitrite, UA positive    Leukocytes, UA large (3+) (A) Negative  POCT UA - Microscopic Only  Result Value Ref Range   WBC, Ur, HPF, POC tntc    RBC, urine, microscopic 5-8    Bacteria, U  Microscopic many    Mucus, UA neg    Epithelial cells, urine per micros few    Crystals, Ur, HPF, POC neg    Casts, Ur, LPF, POC neg    Yeast, UA neg           Assessment & Plan:  1. UTI symptoms  - POCT urinalysis dipstick - POCT UA - Microscopic Only - Urine culture - ciprofloxacin (CIPRO) 500 MG tablet; Take 1 tablet (500 mg total) by mouth 2 (two) times daily.  Dispense: 20 tablet; Refill: 0  2. Cystitis  - ciprofloxacin (CIPRO) 500 MG tablet; Take 1 tablet (500 mg total) by mouth 2 (two) times daily.  Dispense: 20 tablet; Refill: 0  3. Fecal soiling  - Ambulatory referral to Gastroenterology  4. Abdominal rash  - zyrtec or benadryl as directed for itch CHange to dove soap    Continue all meds Labs pending Health Maintenance reviewed Diet and exercise encouraged RTO 2 weeks   Tiffany A. Benjamin Stain PA-C

## 2015-03-13 LAB — URINE CULTURE

## 2015-03-16 ENCOUNTER — Ambulatory Visit (INDEPENDENT_AMBULATORY_CARE_PROVIDER_SITE_OTHER): Payer: Medicare Other | Admitting: Family

## 2015-03-16 ENCOUNTER — Encounter: Payer: Self-pay | Admitting: Family

## 2015-03-16 VITALS — BP 123/73 | HR 88 | Temp 98.3°F | Ht 61.0 in | Wt 122.6 lb

## 2015-03-16 DIAGNOSIS — J069 Acute upper respiratory infection, unspecified: Secondary | ICD-10-CM | POA: Diagnosis not present

## 2015-03-16 MED ORDER — BENZONATATE 200 MG PO CAPS: 200.0000 mg | ORAL_CAPSULE | Freq: Three times a day (TID) | ORAL | Status: DC | PRN

## 2015-03-16 MED ORDER — METHYLPREDNISOLONE 4 MG PO TBPK: ORAL_TABLET | ORAL | Status: DC

## 2015-03-16 MED ORDER — HYDROCODONE-HOMATROPINE 5-1.5 MG/5ML PO SYRP: 5.0000 mL | ORAL_SOLUTION | Freq: Three times a day (TID) | ORAL | Status: DC | PRN

## 2015-03-16 NOTE — Patient Instructions (Signed)
Upper Respiratory Infection, Adult An upper respiratory infection (URI) is also sometimes known as the common cold. The upper respiratory tract includes the nose, sinuses, throat, trachea, and bronchi. Bronchi are the airways leading to the lungs. Most people improve within 1 week, but symptoms can last up to 2 weeks. A residual cough may last even longer.  CAUSES Many different viruses can infect the tissues lining the upper respiratory tract. The tissues become irritated and inflamed and often become very moist. Mucus production is also common. A cold is contagious. You can easily spread the virus to others by oral contact. This includes kissing, sharing a glass, coughing, or sneezing. Touching your mouth or nose and then touching a surface, which is then touched by another person, can also spread the virus. SYMPTOMS  Symptoms typically develop 1 to 3 days after you come in contact with a cold virus. Symptoms vary from person to person. They may include:  Runny nose.  Sneezing.  Nasal congestion.  Sinus irritation.  Sore throat.  Loss of voice (laryngitis).  Cough.  Fatigue.  Muscle aches.  Loss of appetite.  Headache.  Low-grade fever. DIAGNOSIS  You might diagnose your own cold based on familiar symptoms, since most people get a cold 2 to 3 times a year. Your caregiver can confirm this based on your exam. Most importantly, your caregiver can check that your symptoms are not due to another disease such as strep throat, sinusitis, pneumonia, asthma, or epiglottitis. Blood tests, throat tests, and X-rays are not necessary to diagnose a common cold, but they may sometimes be helpful in excluding other more serious diseases. Your caregiver will decide if any further tests are required. RISKS AND COMPLICATIONS  You may be at risk for a more severe case of the common cold if you smoke cigarettes, have chronic heart disease (such as heart failure) or lung disease (such as asthma), or if  you have a weakened immune system. The very young and very old are also at risk for more serious infections. Bacterial sinusitis, middle ear infections, and bacterial pneumonia can complicate the common cold. The common cold can worsen asthma and chronic obstructive pulmonary disease (COPD). Sometimes, these complications can require emergency medical care and may be life-threatening. PREVENTION  The best way to protect against getting a cold is to practice good hygiene. Avoid oral or hand contact with people with cold symptoms. Wash your hands often if contact occurs. There is no clear evidence that vitamin C, vitamin E, echinacea, or exercise reduces the chance of developing a cold. However, it is always recommended to get plenty of rest and practice good nutrition. TREATMENT  Treatment is directed at relieving symptoms. There is no cure. Antibiotics are not effective, because the infection is caused by a virus, not by bacteria. Treatment may include:  Increased fluid intake. Sports drinks offer valuable electrolytes, sugars, and fluids.  Breathing heated mist or steam (vaporizer or shower).  Eating chicken soup or other clear broths, and maintaining good nutrition.  Getting plenty of rest.  Using gargles or lozenges for comfort.  Controlling fevers with ibuprofen or acetaminophen as directed by your caregiver.  Increasing usage of your inhaler if you have asthma. Zinc gel and zinc lozenges, taken in the first 24 hours of the common cold, can shorten the duration and lessen the severity of symptoms. Pain medicines may help with fever, muscle aches, and throat pain. A variety of non-prescription medicines are available to treat congestion and runny nose. Your caregiver   can make recommendations and may suggest nasal or lung inhalers for other symptoms.  HOME CARE INSTRUCTIONS   Only take over-the-counter or prescription medicines for pain, discomfort, or fever as directed by your  caregiver.  Use a warm mist humidifier or inhale steam from a shower to increase air moisture. This may keep secretions moist and make it easier to breathe.  Drink enough water and fluids to keep your urine clear or pale yellow.  Rest as needed.  Return to work when your temperature has returned to normal or as your caregiver advises. You may need to stay home longer to avoid infecting others. You can also use a face mask and careful hand washing to prevent spread of the virus. SEEK MEDICAL CARE IF:   After the first few days, you feel you are getting worse rather than better.  You need your caregiver's advice about medicines to control symptoms.  You develop chills, worsening shortness of breath, or brown or red sputum. These may be signs of pneumonia.  You develop yellow or brown nasal discharge or pain in the face, especially when you bend forward. These may be signs of sinusitis.  You develop a fever, swollen neck glands, pain with swallowing, or white areas in the back of your throat. These may be signs of strep throat. SEEK IMMEDIATE MEDICAL CARE IF:   You have a fever.  You develop severe or persistent headache, ear pain, sinus pain, or chest pain.  You develop wheezing, a prolonged cough, cough up blood, or have a change in your usual mucus (if you have chronic lung disease).  You develop sore muscles or a stiff neck. Document Released: 12/13/2000 Document Revised: 09/11/2011 Document Reviewed: 09/24/2013 [employer] Patient Information 2015 [employer]. This information is not intended to replace advice given to you by your health care provider. Make sure you discuss any questions you have with your health care provider.  - Take meds as prescribed - Use a cool mist humidifier  -Use saline nose sprays frequently -Saline irrigations of the nose can be very helpful if done frequently.  * 4X daily for 1 week*  * Use of a nettie pot can be helpful with this. Follow  directions with this* -Force fluids -For any cough or congestion  Use plain Mucinex- regular strength or max strength is fine   * Children- consult with Pharmacist for dosing -For fever or aces or pains- take tylenol or ibuprofen appropriate for age and weight.  * for fevers greater than 101 orally you may alternate ibuprofen and tylenol every  3 hours. -Throat lozenges if help   Christy Hawks, FNP   

## 2015-03-16 NOTE — Progress Notes (Signed)
Subjective:    Patient ID: Stacy Dalton, female    DOB: April 07, 1951, 64 y.o.   MRN: [id number]  Cough This is a new problem. The current episode started in the past 7 days. The problem has been waxing and waning. The problem occurs every few minutes. The cough is productive of sputum. Associated symptoms include headaches, nasal congestion, postnasal drip, rhinorrhea, a sore throat and wheezing. Pertinent negatives include no chills, ear congestion, ear pain, fever or shortness of breath. The symptoms are aggravated by lying down. She has tried rest for the symptoms. The treatment provided no relief. There is no history of asthma.  Sinusitis Associated symptoms include coughing, headaches and a sore throat. Pertinent negatives include no chills, ear pain or shortness of breath.      Review of Systems  Constitutional: Negative.  Negative for fever and chills.  HENT: Positive for postnasal drip, rhinorrhea and sore throat. Negative for ear pain.   Eyes: Negative.   Respiratory: Positive for cough and wheezing. Negative for shortness of breath.   Cardiovascular: Negative.  Negative for palpitations.  Gastrointestinal: Negative.   Endocrine: Negative.   Genitourinary: Negative.   Musculoskeletal: Negative.   Neurological: Positive for headaches.  Hematological: Negative.   Psychiatric/Behavioral: Negative.   All other systems reviewed and are negative.      Objective:   Physical Exam  Constitutional: She is oriented to person, place, and time. She appears well-developed and well-nourished. No distress.  HENT:  Head: Normocephalic and atraumatic.  Right Ear: External ear normal.  Left Ear: External ear normal.  Nasal passage mildly erythemas Oropharynx erythemas     Eyes: Pupils are equal, round, and reactive to light.  Neck: Normal range of motion. Neck supple. No thyromegaly present.  Cardiovascular: Normal rate, regular rhythm, normal heart sounds and intact distal  pulses.   No murmur heard. Pulmonary/Chest: Effort normal and breath sounds normal. No respiratory distress. She has no wheezes.  Abdominal: Soft. Bowel sounds are normal. She exhibits no distension. There is no tenderness.  Musculoskeletal: Normal range of motion. She exhibits no edema or tenderness.  Neurological: She is alert and oriented to person, place, and time. She has normal reflexes. No cranial nerve deficit.  Skin: Skin is warm and dry.  Psychiatric: She has a normal mood and affect. Her behavior is normal. Judgment and thought content normal.  Vitals reviewed.   BP 123/73 mmHg  Pulse 88  Temp(Src) 98.3 F (36.8 C) (Oral)  Ht 5\' 1"  (1.549 m)  Wt 122 lb 9.6 oz (55.611 kg)  BMI 23.18 kg/m2       Assessment & Plan:  1. Viral upper respiratory infection -- Take meds as prescribed - Use a cool mist humidifier  -Use saline nose sprays frequently -Saline irrigations of the nose can be very helpful if done frequently.  * 4X daily for 1 week*  * Use of a nettie pot can be helpful with this. Follow directions with this* -Force fluids -For any cough or congestion  Use plain Mucinex- regular strength or max strength is fine   * Children- consult with Pharmacist for dosing -For fever or aces or pains- take tylenol or ibuprofen appropriate for age and weight.  * for fevers greater than 101 orally you may alternate ibuprofen and tylenol every  3 hours. -Throat lozenges if help - methylPREDNISolone (MEDROL DOSEPAK) 4 MG TBPK tablet; Use as directed  Dispense: 21 tablet; Refill: 0 - benzonatate (TESSALON) 200 MG capsule; Take 1 capsule (  200 mg total) by mouth 3 (three) times daily as needed.  Dispense: 30 capsule; Refill: 1 - HYDROcodone-homatropine (HYCODAN) 5-1.5 MG/5ML syrup; Take 5 mLs by mouth every 8 (eight) hours as needed for cough.  Dispense: 120 mL; Refill: 0  Evelina Dun, FNP

## 2015-03-20 ENCOUNTER — Other Ambulatory Visit: Payer: Self-pay | Admitting: Family

## 2015-03-24 ENCOUNTER — Encounter: Payer: Self-pay | Admitting: Physician Assistant

## 2015-03-24 ENCOUNTER — Ambulatory Visit (INDEPENDENT_AMBULATORY_CARE_PROVIDER_SITE_OTHER): Payer: Medicare Other | Admitting: Physician Assistant

## 2015-03-24 ENCOUNTER — Ambulatory Visit: Payer: Medicare Other | Admitting: Physician Assistant

## 2015-03-24 VITALS — BP 136/79 | HR 80 | Temp 97.9°F | Ht 61.0 in | Wt 130.0 lb

## 2015-03-24 DIAGNOSIS — Z87440 Personal history of urinary (tract) infections: Secondary | ICD-10-CM | POA: Diagnosis not present

## 2015-03-24 LAB — POCT URINALYSIS DIPSTICK
Bilirubin, UA: NEGATIVE
Blood, UA: NEGATIVE
Glucose, UA: NEGATIVE
Ketones, UA: NEGATIVE
Leukocytes, UA: NEGATIVE
Nitrite, UA: NEGATIVE
Protein, UA: NEGATIVE
Spec Grav, UA: <=1.005
Urobilinogen, UA: NEGATIVE
pH, UA: 7.5

## 2015-03-24 LAB — POCT UA - MICROSCOPIC ONLY
Bacteria, U Microscopic: NEGATIVE
Casts, Ur, LPF, POC: NEGATIVE
Crystals, Ur, HPF, POC: NEGATIVE
Mucus, UA: NEGATIVE
RBC, urine, microscopic: NEGATIVE
WBC, Ur, HPF, POC: NEGATIVE
Yeast, UA: NEGATIVE

## 2015-03-24 NOTE — Progress Notes (Signed)
   Subjective:    Patient ID: Stacy Dalton, female    DOB: December 18, 1950, 64 y.o.   MRN: [id number]  HPI 64 y/o female presents with c/o UTI two weeks ago. She was treated with Cipro 500mg  BID x 10 days. She states that she has had improvement but still having mild symptoms of burning with urination.     Review of Systems  Endocrine: Negative for polyuria.  Genitourinary: Positive for dysuria. Negative for urgency, frequency, hematuria and flank pain.  All other systems reviewed and are negative.      Objective:   Physical Exam  Results for orders placed or performed in visit on 03/24/15  POCT UA - Microscopic Only  Result Value Ref Range   WBC, Ur, HPF, POC neg    RBC, urine, microscopic neg    Bacteria, U Microscopic neg    Mucus, UA neg    Epithelial cells, urine per micros many    Crystals, Ur, HPF, POC neg    Casts, Ur, LPF, POC neg    Yeast, UA neg   POCT urinalysis dipstick  Result Value Ref Range   Color, UA yellow    Clarity, UA clear    Glucose, UA neg    Bilirubin, UA neg    Ketones, UA neg    Spec Grav, UA <=1.005    Blood, UA neg    pH, UA 7.5    Protein, UA neg    Urobilinogen, UA negative    Nitrite, UA neg    Leukocytes, UA Negative Negative         Assessment & Plan:  1. History of UTI UTI appears to be resolved. I have advised patient to follow up in clinic for reassessment if s/s worsen.  - POCT UA - Microscopic Only - POCT urinalysis dipstick   Stacy Dalton A. Benjamin Stain PA-C

## 2015-03-26 ENCOUNTER — Ambulatory Visit: Payer: Medicare Other | Admitting: Physician Assistant

## 2015-03-30 ENCOUNTER — Ambulatory Visit (INDEPENDENT_AMBULATORY_CARE_PROVIDER_SITE_OTHER): Payer: Medicare Other | Admitting: Family Medicine

## 2015-03-30 ENCOUNTER — Encounter: Payer: Self-pay | Admitting: Family Medicine

## 2015-03-30 ENCOUNTER — Ambulatory Visit: Payer: Medicare Other | Admitting: Physician Assistant

## 2015-03-30 VITALS — BP 114/67 | HR 108 | Temp 97.8°F | Ht 61.0 in | Wt 125.8 lb

## 2015-03-30 DIAGNOSIS — J069 Acute upper respiratory infection, unspecified: Secondary | ICD-10-CM

## 2015-03-30 DIAGNOSIS — J200 Acute bronchitis due to Mycoplasma pneumoniae: Secondary | ICD-10-CM

## 2015-03-30 MED ORDER — HYDROCODONE-HOMATROPINE 5-1.5 MG/5ML PO SYRP: 5.0000 mL | ORAL_SOLUTION | Freq: Three times a day (TID) | ORAL | Status: DC | PRN

## 2015-03-30 MED ORDER — GUAIFENESIN-CODEINE 100-10 MG/5ML PO SYRP: 5.0000 mL | ORAL_SOLUTION | ORAL | Status: DC | PRN

## 2015-03-30 MED ORDER — LEVOFLOXACIN 500 MG PO TABS: 500.0000 mg | ORAL_TABLET | Freq: Every day | ORAL | Status: DC

## 2015-03-30 NOTE — Progress Notes (Signed)
Subjective:  Patient ID: Stacy Dalton, female    DOB: 05/18/1951  Age: 64 y.o. MRN: [id number]  CC: URI   HPI Stacy Dalton presents for Cough getting worse since 3 days ago. "Hacking my head off." low greae fever 2 days with chills and sweats  History Stacy Dalton has a past medical history of Hypertension; Depression; COPD (chronic obstructive pulmonary disease); GERD (gastroesophageal reflux disease); Migraine; Anxiety; Migraine; Insomnia; and Hypothyroidism.   She has past surgical history that includes bladder tac; laparotomy (10/04/2011, colostomy also); Tubal ligation; laparotomy (04/19/2012); Colostomy closure (04/19/2012); Ventral hernia repair (04/19/2012); Abdominal hysterectomy; and Colon surgery.   Stacy Dalton family history includes Cancer in Stacy Dalton other, sister, and sister; Diabetes in Stacy Dalton brother and sister; Heart disease in Stacy Dalton mother and sister.She reports that she quit smoking about 17 years ago. Stacy Dalton smoking use included Cigarettes. She has a 30 pack-year smoking history. She has never used smokeless tobacco. She reports that she does not drink alcohol or use illicit drugs.  Outpatient Prescriptions Prior to Visit  Medication Sig Dispense Refill  . ALPRAZolam (XANAX) 0.5 MG tablet Take 1 tablet (0.5 mg total) by mouth at bedtime as needed for anxiety. 60 tablet 3  . atorvastatin (LIPITOR) 40 MG tablet Take 1 tablet (40 mg total) by mouth daily. 90 tablet 3  . AZOR 10-40 MG per tablet TAKE 1 TABLET DAILY 90 tablet 1  . Biotin 1 MG CAPS Take by mouth.    [name], Borago officinalis, (BORAGE OIL) 1000 MG CAPS Take by mouth.    . Coenzyme Q10 (CO Q 10) 100 MG CAPS Take by mouth.    . docusate sodium (COLACE) 100 MG capsule Take 100 mg by mouth at bedtime.    . DULoxetine (CYMBALTA) 60 MG capsule Take 1 capsule (60 mg total) by mouth daily. 90 capsule 3  . levothyroxine (SYNTHROID, LEVOTHROID) 50 MCG tablet TAKE 1 TABLET DAILY 90 tablet 1  . Omega 3 1200 MG CAPS Take by mouth.      . ondansetron (ZOFRAN) 4 MG tablet Take 1 tablet (4 mg total) by mouth every 8 (eight) hours as needed for nausea or vomiting. 30 tablet 1  . traMADol (ULTRAM) 50 MG tablet Take 1 tablet (50 mg total) by mouth every 6 (six) hours as needed. 90 tablet 3  . triamterene-hydrochlorothiazide (MAXZIDE-25) 37.5-25 MG per tablet Take 1 tablet by mouth daily. 90 tablet 4  . Vitamin D, Ergocalciferol, (DRISDOL) 50000 UNITS CAPS capsule Take 1 capsule (50,000 Units total) by mouth every 7 (seven) days. 12 capsule 3  . zolpidem (AMBIEN CR) 12.5 MG CR tablet TAKE 1 TABLET DAILY AT BEDTIME AS NEEDED 90 tablet 3  . HYDROcodone-homatropine (HYCODAN) 5-1.5 MG/5ML syrup Take 5 mLs by mouth every 8 (eight) hours as needed for cough. (Patient not taking: Reported on 03/30/2015) 120 mL 0  . methylPREDNISolone (MEDROL DOSEPAK) 4 MG TBPK tablet Use as directed (Patient not taking: Reported on 03/30/2015) 21 tablet 0   No facility-administered medications prior to visit.    ROS Review of Systems  Constitutional: Negative for fever, chills, activity change and appetite change.  HENT: Positive for congestion, postnasal drip, rhinorrhea and sinus pressure. Negative for ear discharge, ear pain, hearing loss, nosebleeds, sneezing and trouble swallowing.   Respiratory: Negative for chest tightness and shortness of breath.   Cardiovascular: Negative for chest pain and palpitations.  Skin: Negative for rash.    Objective:  BP 114/67 mmHg  Pulse 108  Temp(Src) 97.8  F (36.6 C) (Oral)  Ht 5\' 1"  (1.549 m)  Wt 125 lb 12.8 oz (57.063 kg)  BMI 23.78 kg/m2  BP Readings from Last 3 Encounters:  04/01/15 113/66  03/30/15 114/67  03/24/15 136/79    Wt Readings from Last 3 Encounters:  04/01/15 125 lb (56.7 kg)  03/30/15 125 lb 12.8 oz (57.063 kg)  03/24/15 130 lb (58.968 kg)     Physical Exam  Constitutional: She appears well-developed and well-nourished.  HENT:  Head: Normocephalic and atraumatic.  Right Ear:  Tympanic membrane and external ear normal. No decreased hearing is noted.  Left Ear: Tympanic membrane and external ear normal. No decreased hearing is noted.  Nose: Mucosal edema present. Right sinus exhibits no frontal sinus tenderness. Left sinus exhibits no frontal sinus tenderness.  Mouth/Throat: No oropharyngeal exudate or posterior oropharyngeal erythema.  Neck: No Brudzinski's sign noted.  Pulmonary/Chest: Breath sounds normal. No respiratory distress.  Lymphadenopathy:       Head (right side): No preauricular adenopathy present.       Head (left side): No preauricular adenopathy present.       Right cervical: No superficial cervical adenopathy present.      Left cervical: No superficial cervical adenopathy present.    No results found for: HGBA1C  Lab Results  Component Value Date   WBC 6.3 02/12/2015   HGB 14.2 01/09/2014   HCT 39.9 02/12/2015   PLT 534* 10/07/2012   GLUCOSE 79 02/12/2015   CHOL 145 02/12/2015   TRIG 68 02/12/2015   HDL 75 02/12/2015   LDLCALC 56 02/12/2015   ALT 15 02/12/2015   AST 19 02/12/2015   NA 141 02/12/2015   K 3.7 02/12/2015   CL 96* 02/12/2015   CREATININE 0.70 02/12/2015   BUN 9 02/12/2015   CO2 29 02/12/2015   TSH 3.480 02/12/2015   INR 1.22 10/05/2011    No results found.  Assessment & Plan:   Stacy Dalton was seen today for uri.  Diagnoses and all orders for this visit:  Acute bronchitis due to Mycoplasma pneumoniae  Viral upper respiratory infection -     HYDROcodone-homatropine (HYCODAN) 5-1.5 MG/5ML syrup; Take 5 mLs by mouth every 8 (eight) hours as needed for cough.  Other orders -     levofloxacin (LEVAQUIN) 500 MG tablet; Take 1 tablet (500 mg total) by mouth daily. -     Discontinue: guaiFENesin-codeine (CHERATUSSIN AC) 100-10 MG/5ML syrup; Take 5 mLs by mouth every 4 (four) hours as needed for cough.  I have discontinued Stacy Dalton's methylPREDNISolone and guaiFENesin-codeine. I am also having Stacy Dalton start on  levofloxacin. Additionally, I am having Stacy Dalton maintain Stacy Dalton docusate sodium, Co Q 10, Borage Oil, Omega 3, Biotin, triamterene-hydrochlorothiazide, DULoxetine, Vitamin D (Ergocalciferol), atorvastatin, levothyroxine, zolpidem, traMADol, ALPRAZolam, ondansetron, AZOR, and HYDROcodone-homatropine.  Meds ordered this encounter  Medications  . levofloxacin (LEVAQUIN) 500 MG tablet    Sig: Take 1 tablet (500 mg total) by mouth daily.    Dispense:  7 tablet    Refill:  0  . DISCONTD: guaiFENesin-codeine (CHERATUSSIN AC) 100-10 MG/5ML syrup    Sig: Take 5 mLs by mouth every 4 (four) hours as needed for cough.    Dispense:  180 mL    Refill:  0  . HYDROcodone-homatropine (HYCODAN) 5-1.5 MG/5ML syrup    Sig: Take 5 mLs by mouth every 8 (eight) hours as needed for cough.    Dispense:  120 mL    Refill:  0     Follow-up: No  Follow-up on file.  Claretta Fraise, M.D.

## 2015-04-01 ENCOUNTER — Encounter: Payer: Self-pay | Admitting: Physician Assistant

## 2015-04-01 ENCOUNTER — Ambulatory Visit (INDEPENDENT_AMBULATORY_CARE_PROVIDER_SITE_OTHER): Payer: Medicare Other | Admitting: Physician Assistant

## 2015-04-01 VITALS — BP 113/66 | HR 85 | Temp 97.0°F | Ht 61.0 in | Wt 125.0 lb

## 2015-04-01 DIAGNOSIS — L821 Other seborrheic keratosis: Secondary | ICD-10-CM | POA: Diagnosis not present

## 2015-04-01 DIAGNOSIS — D485 Neoplasm of uncertain behavior of skin: Secondary | ICD-10-CM | POA: Diagnosis not present

## 2015-04-01 DIAGNOSIS — D239 Other benign neoplasm of skin, unspecified: Secondary | ICD-10-CM | POA: Diagnosis not present

## 2015-04-01 NOTE — Progress Notes (Signed)
Patient ID: Stacy Dalton, female   DOB: 12/25/1950, 64 y.o.   MRN: [id number]   64 y/o female presents for full skin check. No h/o skin CA. She has concern over an enlarging lesion on her left neck. Asymptomatic regarding pain.   PE reveals 1cm nodule on left anterior neck. Flesh colored. Negative for erythema.   Lesion on neck was biopsied to r/o BCC/SCC. Will await pathology and notify patient accordingly. Otherwise, benign appearing lesions of lentigo and seborrheic keratoses noted on exam but were benign and required no treatment.   Suggest skin checks yearly.   Tiffany A. Benjamin Stain PA-C

## 2015-04-06 ENCOUNTER — Telehealth: Payer: Self-pay | Admitting: Family

## 2015-04-06 LAB — PATHOLOGY

## 2015-04-07 NOTE — Telephone Encounter (Signed)
Results given.

## 2015-04-09 ENCOUNTER — Ambulatory Visit (INDEPENDENT_AMBULATORY_CARE_PROVIDER_SITE_OTHER): Payer: Medicare Other | Admitting: Family Medicine

## 2015-04-09 ENCOUNTER — Encounter: Payer: Self-pay | Admitting: Family Medicine

## 2015-04-09 VITALS — BP 103/64 | HR 75 | Temp 97.8°F | Ht 61.0 in | Wt 126.6 lb

## 2015-04-09 DIAGNOSIS — R0981 Nasal congestion: Secondary | ICD-10-CM | POA: Diagnosis not present

## 2015-04-09 MED ORDER — FLUTICASONE PROPIONATE 50 MCG/ACT NA SUSP: 2.0000 | Freq: Every day | NASAL | Status: DC

## 2015-04-09 NOTE — Progress Notes (Signed)
BP 103/64 mmHg  Pulse 75  Temp(Src) 97.8 F (36.6 C)  Ht 5\' 1"  (1.549 m)  Wt 126 lb 9.6 oz (57.425 kg)  BMI 23.93 kg/m2   Subjective:    Patient ID: Stacy Dalton, female    DOB: 1951-03-14, 64 y.o.   MRN: [id number]  HPI: Stacy Dalton is a 64 y.o. female presenting on 04/09/2015 for Left ear pain   HPI Sinus congestion Patient has been having sinus congestion and ear pressure for the past 2 weeks. She has been on amoxicillin 500 mg 4 times daily for an infection in her mouth during this time period and it is not helped clear it up. She denies any fevers or chills or cough. Her nasal congestion is cleared up over the past few days. She has a muffled feeling in the left ear and nasal congestion.  Relevant past medical, surgical, family and social history reviewed and updated as indicated. Interim medical history since our last visit reviewed. Allergies and medications reviewed and updated.  Review of Systems  Constitutional: Negative for fever and chills.  HENT: Positive for congestion, ear pain, postnasal drip and sinus pressure. Negative for ear discharge, rhinorrhea (had this but has since resolved), sneezing and sore throat.   Eyes: Negative for redness and visual disturbance.  Respiratory: Negative for chest tightness and shortness of breath.   Cardiovascular: Negative for chest pain and leg swelling.  Genitourinary: Negative for dysuria and difficulty urinating.  Musculoskeletal: Negative for back pain and gait problem.  Skin: Negative for rash.  Neurological: Positive for headaches. Negative for dizziness and light-headedness.  Psychiatric/Behavioral: Negative for behavioral problems and agitation.  All other systems reviewed and are negative.   Per HPI unless specifically indicated above     Medication List       This list is accurate as of: 04/09/15  4:07 PM.  Always use your most recent med list.               ALPRAZolam 0.5 MG tablet  Commonly  known as:  XANAX  Take 1 tablet (0.5 mg total) by mouth at bedtime as needed for anxiety.     atorvastatin 40 MG tablet  Commonly known as:  LIPITOR  Take 1 tablet (40 mg total) by mouth daily.     AZOR 10-40 MG tablet  Generic drug:  amLODipine-olmesartan  TAKE 1 TABLET DAILY     Biotin 1 MG Caps  Take by mouth.     Borage Oil 1000 MG Caps  Take by mouth.     Co Q 10 100 MG Caps  Take by mouth.     docusate sodium 100 MG capsule  Commonly known as:  COLACE  Take 100 mg by mouth at bedtime.     DULoxetine 60 MG capsule  Commonly known as:  CYMBALTA  Take 1 capsule (60 mg total) by mouth daily.     fluticasone 50 MCG/ACT nasal spray  Commonly known as:  FLONASE  Place 2 sprays into both nostrils daily.     HYDROcodone-homatropine 5-1.5 MG/5ML syrup  Commonly known as:  HYCODAN  Take 5 mLs by mouth every 8 (eight) hours as needed for cough.     levofloxacin 500 MG tablet  Commonly known as:  LEVAQUIN  Take 1 tablet (500 mg total) by mouth daily.     levothyroxine 50 MCG tablet  Commonly known as:  SYNTHROID, LEVOTHROID  TAKE 1 TABLET DAILY     Omega 3 1200  MG Caps  Take by mouth.     ondansetron 4 MG tablet  Commonly known as:  ZOFRAN  Take 1 tablet (4 mg total) by mouth every 8 (eight) hours as needed for nausea or vomiting.     traMADol 50 MG tablet  Commonly known as:  ULTRAM  Take 1 tablet (50 mg total) by mouth every 6 (six) hours as needed.     triamterene-hydrochlorothiazide 37.5-25 MG tablet  Commonly known as:  MAXZIDE-25  Take 1 tablet by mouth daily.     Vitamin D (Ergocalciferol) 50000 UNITS Caps capsule  Commonly known as:  DRISDOL  Take 1 capsule (50,000 Units total) by mouth every 7 (seven) days.     zolpidem 12.5 MG CR tablet  Commonly known as:  AMBIEN CR  TAKE 1 TABLET DAILY AT BEDTIME AS NEEDED           Objective:    BP 103/64 mmHg  Pulse 75  Temp(Src) 97.8 F (36.6 C)  Ht 5\' 1"  (1.549 m)  Wt 126 lb 9.6 oz (57.425 kg)   BMI 23.93 kg/m2  Wt Readings from Last 3 Encounters:  04/09/15 126 lb 9.6 oz (57.425 kg)  04/01/15 125 lb (56.7 kg)  03/30/15 125 lb 12.8 oz (57.063 kg)    Physical Exam  Constitutional: She is oriented to person, place, and time. She appears well-developed and well-nourished. No distress.  HENT:  Right Ear: Ear canal normal. No swelling or tenderness. Tympanic membrane is bulging. Tympanic membrane is not injected, not scarred and not perforated. No middle ear effusion.  Left Ear: Ear canal normal. No swelling or tenderness. Tympanic membrane is bulging. Tympanic membrane is not injected, not scarred and not perforated.  No middle ear effusion.  Nose: Mucosal edema present. No rhinorrhea. No epistaxis. Right sinus exhibits no maxillary sinus tenderness and no frontal sinus tenderness. Left sinus exhibits maxillary sinus tenderness. Left sinus exhibits no frontal sinus tenderness.  Mouth/Throat: Uvula is midline. Posterior oropharyngeal edema and posterior oropharyngeal erythema present. No oropharyngeal exudate or tonsillar abscesses.  Eyes: Conjunctivae and EOM are normal.  Cardiovascular: Normal rate and regular rhythm.   No murmur heard. Pulmonary/Chest: Effort normal and breath sounds normal. No respiratory distress. She has no wheezes.  Musculoskeletal: Normal range of motion. She exhibits no edema or tenderness.  Neurological: She is alert and oriented to person, place, and time. Coordination normal.  Skin: Skin is warm and dry. No rash noted. She is not diaphoretic.  Psychiatric: She has a normal mood and affect. Her behavior is normal.  Vitals reviewed.   Results for orders placed or performed in visit on 04/01/15  Pathology Report  Result Value Ref Range   PATH REPORT.SITE OF ORIGIN SPEC Comment    . Comment    PATH REPORT.RELEVANT HX SPEC Comment    PATH REPORT.FINAL DX SPEC Comment    SIGNED OUT BY: Comment    GROSS DESCRIPTION: Comment    . Comment    PAYMENT PROCEDURE  Comment       Assessment & Plan:   Problem List Items Addressed This Visit    None    Visit Diagnoses    Sinus congestion    -  Primary    We'll try Flonase and an antihistamine. Patient is already on amoxicillin.    Relevant Medications    fluticasone (FLONASE) 50 MCG/ACT nasal spray        Follow up plan: Return in about 4 weeks (around 05/07/2015), or if symptoms  worsen or fail to improve, for Regular checkup.  Caryl Pina, MD [facility] 04/09/2015, 4:07 PM

## 2015-04-20 ENCOUNTER — Telehealth: Payer: Self-pay | Admitting: Family Medicine

## 2015-04-26 ENCOUNTER — Ambulatory Visit (INDEPENDENT_AMBULATORY_CARE_PROVIDER_SITE_OTHER): Payer: Medicare Other | Admitting: Pediatrics

## 2015-04-26 ENCOUNTER — Encounter: Payer: Self-pay | Admitting: Pediatrics

## 2015-04-26 VITALS — BP 124/72 | HR 81 | Temp 98.6°F | Ht 61.0 in | Wt 120.2 lb

## 2015-04-26 DIAGNOSIS — F32A Depression, unspecified: Secondary | ICD-10-CM

## 2015-04-26 DIAGNOSIS — R05 Cough: Secondary | ICD-10-CM

## 2015-04-26 DIAGNOSIS — F329 Major depressive disorder, single episode, unspecified: Secondary | ICD-10-CM | POA: Diagnosis not present

## 2015-04-26 DIAGNOSIS — J069 Acute upper respiratory infection, unspecified: Secondary | ICD-10-CM

## 2015-04-26 DIAGNOSIS — R630 Anorexia: Secondary | ICD-10-CM

## 2015-04-26 DIAGNOSIS — J309 Allergic rhinitis, unspecified: Secondary | ICD-10-CM | POA: Diagnosis not present

## 2015-04-26 DIAGNOSIS — R059 Cough, unspecified: Secondary | ICD-10-CM

## 2015-04-26 MED ORDER — HYDROCODONE-HOMATROPINE 5-1.5 MG/5ML PO SYRP: 5.0000 mL | ORAL_SOLUTION | Freq: Three times a day (TID) | ORAL | Status: DC | PRN

## 2015-04-26 MED ORDER — CETIRIZINE HCL 10 MG PO TABS: 10.0000 mg | ORAL_TABLET | Freq: Every day | ORAL | Status: DC

## 2015-04-26 NOTE — Patient Instructions (Addendum)
Netipot twice a day, morning when you wake up and evening before bed. This will help with the post-nasal drip when you go to sleep Cetirizine (zyrtec) every day Flonase every day   24 hour a day CRISIS NUMBER: [id number]   List of local counseling services:  [facility] Therapist [address] [place] ,[place] [address] [phone] Children limited to anxiety and depression- NO ADD/ADHD Does not accept Medicaid  [facility] [address] [place], [place] Does see children Does accept medicaid Will assess for Autism but not treat  [facility] [place]. [address] [place] [phone] Does see children  Does accept Medicaid Medication management- substance abuse- bipolar- grief- family-marriage- OCD- Anxiety- PTSD  [facility] Does see children Does accept medicaid They do perform psychological testing  [place] [address] [place] Schedule through [name]. [phone] Patient must call and make own appointment Does se children Does accept Medicaid  [facility] [place], [place] 7-10 accompanied by an adult, 11 and up by themselves Does accept Medicaid Will see patients with- substance abuse-ADHD-ADD-Bipolar-Domestic violence-Marriage counseling- Family Counseling and sexual abuse  [facility] [address], [place] [phone] Does see children Does accept [facility] [place] [phone]  Dr. Sabra Heck-  Psychiatrist [address] [place], [place] Specializes in ADHD and addictions They do ADHD testing Suboxone clinic  [facility] [address] [place] Does Child psychological testing  [facility] [address] Dr. Dellia [place] [phone] Does Accept Medicaid Evaluates for Autism  [facility] [phone] Does Not accept Medicaid Does do adult ADD evaluations  Dr. Lorenza Evangelist [address] [place] [phone] Does not Take Medicaid Sees ADD and ADHD for treatment  [facility] [address] [place],  [address] [phone] Takes Medicaid WIll see children as young as 66

## 2015-04-26 NOTE — Progress Notes (Signed)
Subjective:    Patient ID: Stacy Dalton, female    DOB: 10-27-1950, 64 y.o.   MRN: [id number]  CC: nasal congestion, cough and depression  HPI: Stacy Dalton is a 64 y.o. female presenting on 04/26/2015 for Nasal Congestion  For the past month has had nasal congestion. Been seen here multple times. Has had two different courses of antibiotics. Now still with ongoing congestion and cough. Will get better for a few days then return. Cough is at times productive Husband died 2.5 yrs ago, Daughter with untreated problems with EtOH has gotten pt very depressed at times. No thoughts of self harm. Decreased appetite Quit smoking in 2000, smoked off and on throughout adult life, sometimes more than 2 ppd No energy recently On cymbalta No fevers, apprx 5 lbs weight loss recently   Relevant past medical, surgical, family and social history reviewed and updated as indicated. Interim medical history since our last visit reviewed. Allergies and medications reviewed and updated.   ROS: Per HPI unless specifically indicated above  Past Medical History Patient Active Problem List   Diagnosis Date Noted  . Osteopenia 06/10/2014  . Vitamin D deficiency disease 06/10/2014  . Hypothyroidism   . Depression 11/18/2012  . Insomnia 11/18/2012  . Chronic pain syndrome 11/18/2012  . HLD (hyperlipidemia) 09/01/2012  . GAD (generalized anxiety disorder) 09/01/2012    Class: Chronic  . S/P colostomy ([place]) 04/19/2012  . Normocytic anemia 10/15/2011  . Hypertension 10/13/2011  . Chronic back pain 10/13/2011    Current Outpatient Prescriptions  Medication Sig Dispense Refill  . ALPRAZolam (XANAX) 0.5 MG tablet Take 1 tablet (0.5 mg total) by mouth at bedtime as needed for anxiety. 60 tablet 3  . atorvastatin (LIPITOR) 40 MG tablet Take 1 tablet (40 mg total) by mouth daily. 90 tablet 3  . AZOR 10-40 MG per tablet TAKE 1 TABLET DAILY 90 tablet 1  . Biotin 1 MG CAPS Take by mouth.    [name], Borago officinalis, (BORAGE OIL) 1000 MG CAPS Take by mouth.    . Coenzyme Q10 (CO Q 10) 100 MG CAPS Take by mouth.    . docusate sodium (COLACE) 100 MG capsule Take 100 mg by mouth at bedtime.    . DULoxetine (CYMBALTA) 60 MG capsule Take 1 capsule (60 mg total) by mouth daily. 90 capsule 3  . fluticasone (FLONASE) 50 MCG/ACT nasal spray Place 2 sprays into both nostrils daily. 16 g 6  . HYDROcodone-homatropine (HYCODAN) 5-1.5 MG/5ML syrup Take 5 mLs by mouth every 8 (eight) hours as needed for cough. 120 mL 0  . levofloxacin (LEVAQUIN) 500 MG tablet Take 1 tablet (500 mg total) by mouth daily. 7 tablet 0  . levothyroxine (SYNTHROID, LEVOTHROID) 50 MCG tablet TAKE 1 TABLET DAILY 90 tablet 1  . Omega 3 1200 MG CAPS Take by mouth.    . ondansetron (ZOFRAN) 4 MG tablet Take 1 tablet (4 mg total) by mouth every 8 (eight) hours as needed for nausea or vomiting. 30 tablet 1  . traMADol (ULTRAM) 50 MG tablet Take 1 tablet (50 mg total) by mouth every 6 (six) hours as needed. 90 tablet 3  . triamterene-hydrochlorothiazide (MAXZIDE-25) 37.5-25 MG per tablet Take 1 tablet by mouth daily. 90 tablet 4  . Vitamin D, Ergocalciferol, (DRISDOL) 50000 UNITS CAPS capsule Take 1 capsule (50,000 Units total) by mouth every 7 (seven) days. 12 capsule 3  . zolpidem (AMBIEN CR) 12.5 MG CR tablet TAKE 1 TABLET DAILY  AT BEDTIME AS NEEDED 90 tablet 3  . cetirizine (ZYRTEC) 10 MG tablet Take 1 tablet (10 mg total) by mouth daily. 30 tablet 11   No current facility-administered medications for this visit.       Objective:    BP 124/72 mmHg  Pulse 81  Temp(Src) 98.6 F (37 C) (Oral)  Ht 5\' 1"  (1.549 m)  Wt 120 lb 3.2 oz (54.522 kg)  BMI 22.72 kg/m2  Wt Readings from Last 3 Encounters:  04/26/15 120 lb 3.2 oz (54.522 kg)  04/09/15 126 lb 9.6 oz (57.425 kg)  04/01/15 125 lb (56.7 kg)    Gen: NAD, alert, thin, cooperative with exam, congested EYES: EOMI, no scleral injection or icterus ENT:  TMs   Erythematous b/l, OP without erythema, no tenderness over sinuses. LYMPH: no cervical LAD CV: NRRR, normal S1/S2, no murmur, WWP Resp: CTABL, no wheezes, normal WOB Abd: +BS, soft, NTND. no guarding or organomegaly Neuro: Alert and oriented MSK: normal muscle bulk     Assessment & Plan:   Stacy Dalton was seen today for nasal congestion, likely multifactorial, pt has stopped her allergy medicine, was feeling better then over past few days started having return symptoms again that could be a new virus. The cough has been continuing, pt with smoking hx. Given ongoing cough will get a CXR. Has been on multi courses of antibiotics. Discussed with pt, I dont see any indication for antibiotics now, but she will let us know if she worsens.  Diagnoses and all orders for this visit:  Cough -     DG Chest 2 View; Future  Viral upper respiratory infection -     HYDROcodone-homatropine (HYCODAN) 5-1.5 MG/5ML syrup; Take 5 mLs by mouth every 8 (eight) hours as needed for cough.  Allergic rhinitis, unspecified allergic rhinitis type -     cetirizine (ZYRTEC) 10 MG tablet; Take 1 tablet (10 mg total) by mouth daily.  Depression Pt feels safe at home, denies SI/HI, thinks depression is why her appetite has been down. Discussed counseling with pt, she has tried group therapy for grief before and found it somewhat helpful. Gave list of counselors in the area that she can call to set up one on one counseling that I think will be very beneficial. Also discussed switching or adding to her current SSRI. Could try mirtazepine which would help with sleep and appetite. She does not want to switch anything today and wants to think about it.  Decreased appetite Likely related to depression. Pt also with heavy smoking history, did not discuss screening for lung cancer with her but consider at future visit..   Follow up plan: Return in about 2 weeks (around 05/10/2015), or if symptoms worsen or fail to improve.  Assunta Found, MD [facility] 04/26/2015, 12:09 PM

## 2015-04-27 ENCOUNTER — Other Ambulatory Visit (INDEPENDENT_AMBULATORY_CARE_PROVIDER_SITE_OTHER): Payer: Medicare Other

## 2015-04-27 DIAGNOSIS — R059 Cough, unspecified: Secondary | ICD-10-CM

## 2015-04-27 DIAGNOSIS — R05 Cough: Secondary | ICD-10-CM

## 2015-05-08 ENCOUNTER — Other Ambulatory Visit: Payer: Self-pay | Admitting: Family

## 2015-05-10 ENCOUNTER — Ambulatory Visit: Payer: Medicare Other | Admitting: Family Medicine

## 2015-05-13 ENCOUNTER — Ambulatory Visit (INDEPENDENT_AMBULATORY_CARE_PROVIDER_SITE_OTHER): Payer: Medicare Other

## 2015-05-13 DIAGNOSIS — Z23 Encounter for immunization: Secondary | ICD-10-CM | POA: Diagnosis not present

## 2015-05-16 ENCOUNTER — Other Ambulatory Visit: Payer: Self-pay | Admitting: Family Medicine

## 2015-06-06 ENCOUNTER — Other Ambulatory Visit: Payer: Self-pay | Admitting: Family Medicine

## 2015-06-11 ENCOUNTER — Ambulatory Visit: Payer: Medicare Other | Admitting: Pharmacist

## 2015-06-14 ENCOUNTER — Ambulatory Visit: Payer: Medicare Other | Admitting: Pharmacist

## 2015-06-15 ENCOUNTER — Telehealth: Payer: Self-pay | Admitting: Family

## 2015-06-15 DIAGNOSIS — M549 Dorsalgia, unspecified: Secondary | ICD-10-CM

## 2015-06-15 DIAGNOSIS — G894 Chronic pain syndrome: Secondary | ICD-10-CM

## 2015-06-15 DIAGNOSIS — G8929 Other chronic pain: Secondary | ICD-10-CM

## 2015-06-15 MED ORDER — TRAMADOL HCL 50 MG PO TABS: 50.0000 mg | ORAL_TABLET | Freq: Four times a day (QID) | ORAL | Status: DC | PRN

## 2015-06-15 NOTE — Telephone Encounter (Signed)
RX ready for pick up 

## 2015-06-15 NOTE — Telephone Encounter (Signed)
Pt called that rx is up front and ready to be picked up she will pick up tomorrow she has a appt with pharmacist

## 2015-06-16 ENCOUNTER — Encounter: Payer: Self-pay | Admitting: Pharmacist

## 2015-06-16 ENCOUNTER — Ambulatory Visit (INDEPENDENT_AMBULATORY_CARE_PROVIDER_SITE_OTHER): Payer: Medicare Other | Admitting: Pharmacist

## 2015-06-16 VITALS — BP 120/70 | HR 84 | Ht 61.0 in | Wt 119.0 lb

## 2015-06-16 DIAGNOSIS — F329 Major depressive disorder, single episode, unspecified: Secondary | ICD-10-CM

## 2015-06-16 DIAGNOSIS — Z0000 Encounter for general adult medical examination without abnormal findings: Secondary | ICD-10-CM

## 2015-06-16 DIAGNOSIS — F32A Depression, unspecified: Secondary | ICD-10-CM

## 2015-06-16 DIAGNOSIS — Z1211 Encounter for screening for malignant neoplasm of colon: Secondary | ICD-10-CM

## 2015-06-16 DIAGNOSIS — I10 Essential (primary) hypertension: Secondary | ICD-10-CM

## 2015-06-16 DIAGNOSIS — F411 Generalized anxiety disorder: Secondary | ICD-10-CM

## 2015-06-16 MED ORDER — ATORVASTATIN CALCIUM 40 MG PO TABS: 20.0000 mg | ORAL_TABLET | Freq: Every day | ORAL | Status: DC

## 2015-06-16 NOTE — Progress Notes (Signed)
Patient ID: Stacy Dalton, female   DOB: Jul 13, 1950, 64 y.o.   MRN: [id number]    Subjective:   Stacy Dalton is a 64 y.o. white female who presents for an Initial Medicare Annual Wellness Visit.  She is a retied widow.  He husband diet in 2014 and she states that she still has a lot of sadness related to his death.  She is currently taking cymbalta 60mg  for depression.  She also has attend Grief Sharing meeting in that past but it has been several months.    Review of Systems  Review of Systems  Constitutional: Negative.   HENT: Negative.   Eyes: Negative.   Respiratory: Negative.   Cardiovascular: Negative.   Gastrointestinal: Negative.   Genitourinary: Negative.   Musculoskeletal: Positive for back pain, joint pain and falls. Negative for myalgias and neck pain.  Skin: Negative.   Neurological: Negative.   Endo/Heme/Allergies: Negative.   Psychiatric/Behavioral: Positive for depression. Negative for suicidal ideas, hallucinations, memory loss and substance abuse. The patient is not nervous/anxious and does not have insomnia.      Current Medications (verified) Outpatient Encounter Prescriptions as of 06/16/2015  Medication Sig  . ALPRAZolam (XANAX) 0.5 MG tablet Take 1 tablet (0.5 mg total) by mouth daily as needed for anxiety.  . AZOR 10-40 MG per tablet TAKE 1 TABLET DAILY  . cetirizine (ZYRTEC) 10 MG tablet Take 1 tablet (10 mg total) by mouth daily.  [name] docusate sodium (COLACE) 100 MG capsule Take 100 mg by mouth at bedtime.  . DULoxetine (CYMBALTA) 60 MG capsule Take 1 capsule (60 mg total) by mouth daily.  [name] levothyroxine (SYNTHROID, LEVOTHROID) 50 MCG tablet TAKE 1 TABLET DAILY  . traMADol (ULTRAM) 50 MG tablet Take 1 tablet (50 mg total) by mouth every 6 (six) hours as needed.  . triamterene-hydrochlorothiazide (DYAZIDE) 37.5-25 MG capsule TAKE 1 CAPSULE DAILY  . Vitamin D, Ergocalciferol, (DRISDOL) 50000 UNITS CAPS capsule Take 1 capsule (50,000 Units total) by  mouth every 7 (seven) days.  [name] zolpidem (AMBIEN CR) 12.5 MG CR tablet TAKE 1 TABLET DAILY AT BEDTIME AS NEEDED  . [DISCONTINUED] ALPRAZolam (XANAX) 0.5 MG tablet Take 1 tablet (0.5 mg total) by mouth at bedtime as needed for anxiety. (Patient taking differently: Take 0.5 mg by mouth daily as needed for anxiety. )  . atorvastatin (LIPITOR) 40 MG tablet Take 0.5 tablets (20 mg total) by mouth daily.  . Coenzyme Q10 (CO Q 10) 100 MG CAPS Take by mouth. Reported on 06/16/2015  . fluticasone (FLONASE) 50 MCG/ACT nasal spray Place 2 sprays into both nostrils daily. (Patient not taking: Reported on 06/16/2015)  . [DISCONTINUED] atorvastatin (LIPITOR) 40 MG tablet Take 1 tablet (40 mg total) by mouth daily. (Patient not taking: Reported on 06/16/2015)  . [DISCONTINUED] Biotin 1 MG CAPS Take by mouth. Reported on 06/16/2015  . [DISCONTINUED] Borage, Borago officinalis, (BORAGE OIL) 1000 MG CAPS Take by mouth.  . [DISCONTINUED] DULoxetine (CYMBALTA) 60 MG capsule TAKE 1 CAPSULE DAILY  . [DISCONTINUED] HYDROcodone-homatropine (HYCODAN) 5-1.5 MG/5ML syrup Take 5 mLs by mouth every 8 (eight) hours as needed for cough. (Patient not taking: Reported on 06/16/2015)  . [DISCONTINUED] levofloxacin (LEVAQUIN) 500 MG tablet Take 1 tablet (500 mg total) by mouth daily. (Patient not taking: Reported on 06/16/2015)  . [DISCONTINUED] Omega 3 1200 MG CAPS Take by mouth. Reported on 06/16/2015  . [DISCONTINUED] ondansetron (ZOFRAN) 4 MG tablet Take 1 tablet (4 mg total) by mouth every 8 (eight) hours as needed  for nausea or vomiting. (Patient not taking: Reported on 06/16/2015)  . [DISCONTINUED] triamterene-hydrochlorothiazide (MAXZIDE-25) 37.5-25 MG per tablet Take 1 tablet by mouth daily.   No facility-administered encounter medications on file as of 06/16/2015.    Allergies (verified) Toradol; Demerol; Esgic; and Iodine   History: Past Medical History  Diagnosis Date  . Hypertension   . Depression   . COPD  (chronic obstructive pulmonary disease) ([place])     told has copd, no current inhaler use  . GERD (gastroesophageal reflux disease)   . Migraine   . Anxiety     panic attacks  . Migraine   . Insomnia   . Hypothyroidism   . Osteopenia    Past Surgical History  Procedure Laterality Date  . Bladder tac    . Laparotomy  10/04/2011, colostomy also    Procedure: EXPLORATORY LAPAROTOMY;  Surgeon: Adin Hector, MD;  Location: [facility];  Service: General;  Laterality: N/A;  left partial colectomy with colostomy  . Tubal ligation    . Laparotomy  04/19/2012    Procedure: EXPLORATORY LAPAROTOMY;  Surgeon: Adin Hector, MD;  Location: [facility];  Service: General;  Laterality: N/A;  . Colostomy closure  04/19/2012    Procedure: COLOSTOMY CLOSURE;  Surgeon: Adin Hector, MD;  Location: [facility];  Service: General;  Laterality: N/A;  Laparotomy, Resection and Closure of Colostomy  . Ventral hernia repair  04/19/2012    Procedure: HERNIA REPAIR VENTRAL ADULT;  Surgeon: Adin Hector, MD;  Location: [facility];  Service: General;  Laterality: N/A;  . Abdominal hysterectomy    . Colon surgery     Family History  Problem Relation Age of Onset  . Heart disease Mother   . Cancer Sister     sinus  . Diabetes Brother   . Heart disease Brother   . Cancer Sister     abdominal ?  . Thyroid disease Sister   . Cancer Other     GE junction adenocarcinoma  . Emphysema Father   . Heart disease Father   . Stroke Father   . COPD Sister   . Diabetes Brother    Social History   Occupational History  . Not on file.   Social History Main Topics  . Smoking status: Former Smoker -- 1.50 packs/day for 20 years    Types: Cigarettes    Quit date: 10/03/1997  . Smokeless tobacco: Never Used  . Alcohol Use: No  . Drug Use: No  . Sexual Activity: No    Do you feel safe at home?  Yes  Dietary issues and exercise activities: Current Exercise Habits:: The patient does not participate in regular  exercise at present  Current Dietary habits:  Patient does not cook much.  She eats out at [employer] (eats vegetables there) or [employer].  She use to make a health drink of berries, spinach and banana once a day but she has not done this is about 2-3 months.   Objective:    Today's Vitals   06/16/15 1156  BP: 120/70  Pulse: 84  Height: 5\' 1"  (1.549 m)  Weight: 119 lb (53.978 kg)  PainSc: 2   PainLoc: Back   Body mass index is 22.5 kg/(m^2).  Activities of Daily Living In your present state of health, do you have any difficulty performing the following activities: 06/16/2015  Hearing? N  Vision? N  Difficulty concentrating or making decisions? N  Walking or climbing stairs? N  Dressing or bathing? N  Doing errands, shopping? N  Preparing Food and eating ? N  Using the Toilet? N  In the past six months, have you accidently leaked urine? N  Do you have problems with loss of bowel control? N  Managing your Medications? N  Managing your Finances? N  Housekeeping or managing your Housekeeping? N    Are there smokers in your home (other than you)? No   Cardiac Risk Factors include: advanced age (>82men, >74 women);dyslipidemia;family history of premature cardiovascular disease;sedentary lifestyle  Depression Screen PHQ 2/9 Scores 06/16/2015 04/26/2015 04/09/2015 03/30/2015  PHQ - 2 Score 3 2 2 1   PHQ- 9 Score 7 7 6  -    Fall Risk Fall Risk  06/16/2015 04/26/2015 04/09/2015 03/30/2015 02/12/2015  Falls in the past year? Yes No No No No  Number falls in past yr: 1 - - - -  Injury with Fall? No - - - -  Follow up Falls prevention discussed - - - -    Cognitive Function: MMSE - Mini Mental State Exam 06/16/2015  Orientation to time 5  Orientation to Place 5  Registration 3  Attention/ Calculation 5  Recall 3  Language- name 2 objects 2  Language- repeat 1  Language- follow 3 step command 3  Language- read & follow direction 1  Write a sentence 1  Copy design 1  Total  score 30    Immunizations and Health Maintenance Immunization History  Administered Date(s) Administered  . Influenza Whole 04/29/2012  . Influenza,inj,Quad PF,36+ Mos 05/05/2013, 06/10/2014, 05/13/2015   There are no preventive care reminders to display for this patient.  Patient Care Team: Worthy Rancher, MD as PCP - General (Family Medicine)  Indicate any recent Medical Services you may have received from other than [facility] providers in the past year (date may be approximate).    Assessment:    Annual Wellness Visit  Depression - on pharmacotherapy osteopenia - BMD UTD  Screening Tests Health Maintenance  Topic Date Due  . ZOSTAVAX  07/09/2015 (Originally 07/16/2008)  . MAMMOGRAM  08/15/2015 (Originally 07/16/1998)  . TETANUS/TDAP  08/15/2015 (Originally 07/17/1967)  . Hepatitis C Screening  08/15/2015 (Originally September 22, 1950)  . PNA vac Low Risk Adult (1 of 2 - PCV13) 11/17/2020 (Originally 07/16/2013)  . INFLUENZA VACCINE  02/01/2016  . DEXA SCAN  06/10/2016  . COLONOSCOPY  02/27/2022        Plan:   During the course of the visit [facility] was educated and counseled about the following appropriate screening and preventive services:   Vaccines:  Influenza UTD.   Pneumoccal, Hepatitis B, Td, Zostavax - refused these vaccines  Colorectal cancer screening - FOBT given today  Cardiovascular disease screening - BP is at goal.   Patient stopped atorvastatin since last lipids panel.  She is to restart atorvastatin 40mg  1/2 tablet daily  Diabetes screening - WNL at last check 02/2015  Bone Denisty / Osteoporosis Screening - UTD - due to recheck in 1 year  Mammogram - declined  Nutrition counseling - recommended she restart her health drink / smoothie  Also encouraged to continue high vegetable intake.  Smoking cessation counseling - encouraged to continue to not smoke, she quit about 10 years ago  Advanced Directives - information given in and reviewed with  patient  Suggest she attend grief sharing meetings again.   Start [employer] program or other exercise program - this will help with balance / prevent falls and also improve mood.     Patient Instructions (the  written plan) were given to the patient.   Cherre Robins, PHARMD   06/16/2015         3

## 2015-06-16 NOTE — Patient Instructions (Addendum)
  Stacy Dalton , Thank you for taking time to come for your Medicare Wellness Visit. I appreciate your ongoing commitment to your health goals. Please review the following plan we discussed and let me know if I can assist you in the future.   These are the goals we discussed: Restart atorvastatin 40mg  at 1/2 tablet daily. Consider starting [employer] program at [place] group again or at least keep in touch with your friend from the group.  Remember to bring back stool test as soon as possible.   This is a list of the screening recommended for you and due dates:  Health Maintenance  Topic Date Due  . Shingles Vaccine  07/16/2008  . Mammogram  08/15/2015*  . Tetanus Vaccine  08/15/2015*  .  Hepatitis C: One time screening is recommended by Center for Disease Control  (CDC) for  adults born from [birth year] through 1967.   08/15/2015 - plan to check with next blood draw  . Pneumonia vaccines (1 of 2 - PCV13) 11/17/2020*  . Flu Shot  02/01/2016  . DEXA scan (bone density measurement)  06/10/2016  . Colon Cancer Screening  02/27/2022  *Topic was postponed. The date shown is not the original due date.

## 2015-07-01 ENCOUNTER — Ambulatory Visit (INDEPENDENT_AMBULATORY_CARE_PROVIDER_SITE_OTHER): Payer: Medicare Other | Admitting: Family

## 2015-07-01 ENCOUNTER — Encounter: Payer: Self-pay | Admitting: Family

## 2015-07-01 ENCOUNTER — Other Ambulatory Visit: Payer: Self-pay | Admitting: Family Medicine

## 2015-07-01 VITALS — BP 138/72 | HR 72 | Temp 97.3°F | Ht 61.0 in | Wt 118.0 lb

## 2015-07-01 DIAGNOSIS — A084 Viral intestinal infection, unspecified: Secondary | ICD-10-CM

## 2015-07-01 DIAGNOSIS — R300 Dysuria: Secondary | ICD-10-CM | POA: Diagnosis not present

## 2015-07-01 LAB — POCT URINALYSIS DIPSTICK
Bilirubin, UA: NEGATIVE
Blood, UA: NEGATIVE
Glucose, UA: NEGATIVE
Ketones, UA: NEGATIVE
Leukocytes, UA: NEGATIVE
Nitrite, UA: NEGATIVE
Protein, UA: NEGATIVE
Spec Grav, UA: <=1.005
Urobilinogen, UA: NEGATIVE
pH, UA: 7.5

## 2015-07-01 LAB — POCT UA - MICROSCOPIC ONLY
Bacteria, U Microscopic: NEGATIVE
Casts, Ur, LPF, POC: NEGATIVE
Crystals, Ur, HPF, POC: NEGATIVE
Mucus, UA: NEGATIVE
RBC, urine, microscopic: NEGATIVE
WBC, Ur, HPF, POC: NEGATIVE
Yeast, UA: NEGATIVE

## 2015-07-01 MED ORDER — ONDANSETRON HCL 4 MG PO TABS: 4.0000 mg | ORAL_TABLET | Freq: Three times a day (TID) | ORAL | Status: DC | PRN

## 2015-07-01 NOTE — Patient Instructions (Signed)

## 2015-07-01 NOTE — Progress Notes (Signed)
Subjective:    Patient ID: Stacy Dalton, female    DOB: Jul 18, 1950, 64 y.o.   MRN: [id number]  Pt presents to the office today for dysuria and "just not feeling good". PT states she has custody of her two grandchildren who are both on antibiotics. Pt states she has had nausea and vomiting and just feeling "wore out".  Dysuria  This is a new problem. The current episode started in the past 7 days. The problem occurs intermittently. The problem has been waxing and waning. The quality of the pain is described as burning. The pain is at a severity of 4/10. The pain is mild. There has been no fever. Associated symptoms include flank pain, frequency, hesitancy, nausea, urgency and vomiting. Pertinent negatives include no chills, discharge or hematuria. She has tried increased fluids and acetaminophen for the symptoms. The treatment provided no relief.      Review of Systems  Constitutional: Negative.  Negative for chills.  HENT: Negative.   Eyes: Negative.   Respiratory: Negative.  Negative for shortness of breath.   Cardiovascular: Negative.  Negative for palpitations.  Gastrointestinal: Positive for nausea and vomiting.  Endocrine: Negative.   Genitourinary: Positive for dysuria, hesitancy, urgency, frequency and flank pain. Negative for hematuria.  Musculoskeletal: Negative.   Neurological: Negative.  Negative for headaches.  Hematological: Negative.   Psychiatric/Behavioral: Negative.   All other systems reviewed and are negative.      Objective:   Physical Exam  Constitutional: She is oriented to person, place, and time. She appears well-developed and well-nourished. No distress.  HENT:  Head: Normocephalic and atraumatic.  Right Ear: External ear normal.  Left Ear: External ear normal.  Nose: Nose normal.  Mouth/Throat: Oropharynx is clear and moist.  Eyes: Pupils are equal, round, and reactive to light.  Neck: Normal range of motion. Neck supple. No thyromegaly present.    Cardiovascular: Normal rate, regular rhythm, normal heart sounds and intact distal pulses.   No murmur heard. Pulmonary/Chest: Effort normal and breath sounds normal. No respiratory distress. She has no wheezes.  Abdominal: Soft. Bowel sounds are normal. She exhibits no distension. There is no tenderness.  Musculoskeletal: Normal range of motion. She exhibits no edema or tenderness.  Negative for CVA tenderness   Neurological: She is alert and oriented to person, place, and time. She has normal reflexes. No cranial nerve deficit.  Skin: Skin is warm and dry.  Psychiatric: She has a normal mood and affect. Her behavior is normal. Judgment and thought content normal.  Vitals reviewed.  Results for orders placed or performed in visit on 07/01/15  POCT urinalysis dipstick  Result Value Ref Range   Color, UA yellow    Clarity, UA clear    Glucose, UA neg    Bilirubin, UA neg    Ketones, UA neg    Spec Grav, UA <=1.005    Blood, UA neg    pH, UA 7.5    Protein, UA neg    Urobilinogen, UA negative    Nitrite, UA neg    Leukocytes, UA Negative Negative  POCT UA - Microscopic Only  Result Value Ref Range   WBC, Ur, HPF, POC neg    RBC, urine, microscopic neg    Bacteria, U Microscopic neg    Mucus, UA neg    Epithelial cells, urine per micros occ    Crystals, Ur, HPF, POC neg    Casts, Ur, LPF, POC neg    Yeast, UA neg  BP 138/72 mmHg  Pulse 72  Temp(Src) 97.3 F (36.3 C) (Oral)  Ht 5\' 1"  (1.549 m)  Wt 118 lb (53.524 kg)  BMI 22.31 kg/m2     Assessment & Plan:  1. Dysuria  - POCT urinalysis dipstick - POCT UA - Microscopic Only  2. Viral gastroenteritis -Force fluids -Rest -Tylenol prn for fever or pain - ondansetron (ZOFRAN) 4 MG tablet; Take 1 tablet (4 mg total) by mouth every 8 (eight) hours as needed for nausea or vomiting.  Dispense: 20 tablet; Refill: 0  Evelina Dun, FNP

## 2015-07-01 NOTE — Addendum Note (Signed)
Addended by: Evelina Dun A on: 07/01/2015 04:29 PM   Modules accepted: Orders

## 2015-07-01 NOTE — Telephone Encounter (Signed)
Patient aware she will need to be seen and appointment scheduled for today at 3:10 with [facility].

## 2015-07-23 ENCOUNTER — Ambulatory Visit (INDEPENDENT_AMBULATORY_CARE_PROVIDER_SITE_OTHER): Payer: Medicare Other | Admitting: Family Medicine

## 2015-07-23 ENCOUNTER — Encounter: Payer: Self-pay | Admitting: Family Medicine

## 2015-07-23 VITALS — BP 123/71 | HR 94 | Temp 97.6°F | Ht 61.0 in | Wt 121.2 lb

## 2015-07-23 DIAGNOSIS — J189 Pneumonia, unspecified organism: Secondary | ICD-10-CM | POA: Diagnosis not present

## 2015-07-23 MED ORDER — GUAIFENESIN-CODEINE 100-10 MG/5ML PO SOLN: 5.0000 mL | Freq: Three times a day (TID) | ORAL | Status: DC | PRN

## 2015-07-23 MED ORDER — AMOXICILLIN-POT CLAVULANATE 875-125 MG PO TABS: 1.0000 | ORAL_TABLET | Freq: Two times a day (BID) | ORAL | Status: DC

## 2015-07-23 NOTE — Patient Instructions (Signed)

## 2015-07-23 NOTE — Progress Notes (Signed)
   HPI  Patient presents today for cough and cold symptoms.  Patient explains that she's had cough, congestion, and left ear pain along with chest pain over the last 4 days area  She describes her chest pain as bilateral lower chest pain and bilateral lower thoracic back pain worse with deep inspiration and cough. She also has malaise. She has no fever or chills, or body aches.  She does have increased cough, slightly increased dyspnea over baseline, and sick contacts with 2 grandchildren that have had coughs lately.  She's been on antibiotics in September twice, I don't see any since that time.  PMH: Smoking status noted ROS: Per HPI  Objective: BP 123/71 mmHg  Pulse 94  Temp(Src) 97.6 F (36.4 C) (Oral)  Ht 5\' 1"  (1.549 m)  Wt 121 lb 3.2 oz (54.976 kg)  BMI 22.91 kg/m2 Gen: NAD, alert, cooperative with exam HEENT: NCAT, EOMI, PERRL CV: RRR, good S1/S2, no murmur Resp:, Nonlabored, right lower lobe with crackles Ext: No edema, warm Neuro: Alert and oriented, No gross deficits  Assessment and plan:  # Required pneumonia Considering pleuritic chest pain, lung findings, and malaise (treat her for pneumonia Augmentin Guaifenesin with codeine for cough The clinic if worsening or does not improve as expected. 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 amoxicillin-clavulanate (AUGMENTIN) 875-125 MG tablet    Sig: Take 1 tablet by mouth 2 (two) times daily.    Dispense:  20 tablet    Refill:  0    Laroy Apple, MD [facility] 07/23/2015, 10:58 AM     '

## 2015-08-06 ENCOUNTER — Other Ambulatory Visit: Payer: Self-pay | Admitting: Family

## 2015-08-09 ENCOUNTER — Other Ambulatory Visit: Payer: Self-pay

## 2015-08-09 DIAGNOSIS — I10 Essential (primary) hypertension: Secondary | ICD-10-CM

## 2015-08-09 DIAGNOSIS — F411 Generalized anxiety disorder: Secondary | ICD-10-CM

## 2015-08-09 DIAGNOSIS — F329 Major depressive disorder, single episode, unspecified: Secondary | ICD-10-CM

## 2015-08-09 DIAGNOSIS — F32A Depression, unspecified: Secondary | ICD-10-CM

## 2015-08-09 MED ORDER — ALPRAZOLAM 0.5 MG PO TABS: 0.5000 mg | ORAL_TABLET | Freq: Every day | ORAL | Status: DC | PRN

## 2015-08-09 NOTE — Telephone Encounter (Signed)
Refill called to [facility] 

## 2015-08-09 NOTE — Telephone Encounter (Signed)
Lat seen 07/23/15  Dr Wendi Snipes  If approved route to nurse to call into [facility]

## 2015-08-09 NOTE — Telephone Encounter (Signed)
Okay to refill, patient needs an appointment though to establish with me for this issue.

## 2015-08-13 ENCOUNTER — Other Ambulatory Visit: Payer: Self-pay | Admitting: Pediatrics

## 2015-08-14 ENCOUNTER — Other Ambulatory Visit: Payer: Self-pay | Admitting: Family

## 2015-08-16 NOTE — Telephone Encounter (Signed)
Last seen 07/23/15 Dr Wendi Snipes  Last Vit D 02/12/15  2.9.9

## 2015-08-17 ENCOUNTER — Encounter: Payer: Self-pay | Admitting: Family

## 2015-08-17 ENCOUNTER — Ambulatory Visit (INDEPENDENT_AMBULATORY_CARE_PROVIDER_SITE_OTHER): Payer: Medicare Other | Admitting: Family

## 2015-08-17 VITALS — BP 124/77 | HR 98 | Temp 98.2°F | Ht 61.0 in | Wt 115.0 lb

## 2015-08-17 DIAGNOSIS — Z780 Asymptomatic menopausal state: Secondary | ICD-10-CM

## 2015-08-17 DIAGNOSIS — G894 Chronic pain syndrome: Secondary | ICD-10-CM

## 2015-08-17 DIAGNOSIS — R05 Cough: Secondary | ICD-10-CM

## 2015-08-17 DIAGNOSIS — E559 Vitamin D deficiency, unspecified: Secondary | ICD-10-CM | POA: Diagnosis not present

## 2015-08-17 DIAGNOSIS — F411 Generalized anxiety disorder: Secondary | ICD-10-CM

## 2015-08-17 DIAGNOSIS — G4700 Insomnia, unspecified: Secondary | ICD-10-CM

## 2015-08-17 DIAGNOSIS — E785 Hyperlipidemia, unspecified: Secondary | ICD-10-CM | POA: Diagnosis not present

## 2015-08-17 DIAGNOSIS — G8929 Other chronic pain: Secondary | ICD-10-CM | POA: Diagnosis not present

## 2015-08-17 DIAGNOSIS — M8580 Other specified disorders of bone density and structure, unspecified site: Secondary | ICD-10-CM | POA: Diagnosis not present

## 2015-08-17 DIAGNOSIS — E039 Hypothyroidism, unspecified: Secondary | ICD-10-CM

## 2015-08-17 DIAGNOSIS — M549 Dorsalgia, unspecified: Secondary | ICD-10-CM

## 2015-08-17 DIAGNOSIS — I10 Essential (primary) hypertension: Secondary | ICD-10-CM | POA: Diagnosis not present

## 2015-08-17 DIAGNOSIS — Z1159 Encounter for screening for other viral diseases: Secondary | ICD-10-CM

## 2015-08-17 DIAGNOSIS — F329 Major depressive disorder, single episode, unspecified: Secondary | ICD-10-CM | POA: Diagnosis not present

## 2015-08-17 DIAGNOSIS — R059 Cough, unspecified: Secondary | ICD-10-CM

## 2015-08-17 DIAGNOSIS — F32A Depression, unspecified: Secondary | ICD-10-CM

## 2015-08-17 MED ORDER — BENZONATATE 200 MG PO CAPS: 200.0000 mg | ORAL_CAPSULE | Freq: Three times a day (TID) | ORAL | Status: DC | PRN

## 2015-08-17 MED ORDER — TRAMADOL HCL 50 MG PO TABS: 50.0000 mg | ORAL_TABLET | Freq: Four times a day (QID) | ORAL | Status: DC | PRN

## 2015-08-17 MED ORDER — BUPROPION HCL ER (XL) 150 MG PO TB24: 150.0000 mg | ORAL_TABLET | Freq: Every day | ORAL | Status: DC

## 2015-08-17 MED ORDER — HYDROCODONE-HOMATROPINE 5-1.5 MG/5ML PO SYRP: 5.0000 mL | ORAL_SOLUTION | Freq: Three times a day (TID) | ORAL | Status: DC | PRN

## 2015-08-17 MED ORDER — ZOLPIDEM TARTRATE ER 12.5 MG PO TBCR: EXTENDED_RELEASE_TABLET | ORAL | Status: DC
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
## 2015-08-17 NOTE — Patient Instructions (Signed)
Health Maintenance, Female Adopting a healthy lifestyle and getting preventive care can go a long way to promote health and wellness. Talk with your health care provider about what schedule of regular examinations is right for you. This is a good chance for you to check in with your provider about disease prevention and staying healthy. In between checkups, there are plenty of things you can do on your own. Experts have done a lot of research about which lifestyle changes and preventive measures are most likely to keep you healthy. Ask your health care provider for more information. WEIGHT AND DIET  Eat a healthy diet  Be sure to include plenty of vegetables, fruits, low-fat dairy products, and lean protein.  Do not eat a lot of foods high in solid fats, added sugars, or salt.  Get regular exercise. This is one of the most important things you can do for your health.  Most adults should exercise for at least 150 minutes each week. The exercise should increase your heart rate and make you sweat (moderate-intensity exercise).  Most adults should also do strengthening exercises at least twice a week. This is in addition to the moderate-intensity exercise.  Maintain a healthy weight  Body mass index (BMI) is a measurement that can be used to identify possible weight problems. It estimates body fat based on height and weight. Your health care provider can help determine your BMI and help you achieve or maintain a healthy weight.  For females 20 years of age and older:   A BMI below 18.5 is considered underweight.  A BMI of 18.5 to 24.9 is normal.  A BMI of 25 to 29.9 is considered overweight.  A BMI of 30 and above is considered obese.  Watch levels of cholesterol and blood lipids  You should start having your blood tested for lipids and cholesterol at 67 years of age, then have this test every 5 years.  You may need to have your cholesterol levels checked more often if:  Your lipid  or cholesterol levels are high.  You are older than 67 years of age.  You are at high risk for heart disease.  CANCER SCREENING   Lung Cancer  Lung cancer screening is recommended for adults 55-80 years old who are at high risk for lung cancer because of a history of smoking.  A yearly low-dose CT scan of the lungs is recommended for people who:  Currently smoke.  Have quit within the past 15 years.  Have at least a 30-pack-year history of smoking. A pack year is smoking an average of one pack of cigarettes a day for 1 year.  Yearly screening should continue until it has been 15 years since you quit.  Yearly screening should stop if you develop a health problem that would prevent you from having lung cancer treatment.  Breast Cancer  Practice breast self-awareness. This means understanding how your breasts normally appear and feel.  It also means doing regular breast self-exams. Let your health care provider know about any changes, no matter how small.  If you are in your 20s or 30s, you should have a clinical breast exam (CBE) by a health care provider every 1-3 years as part of a regular health exam.  If you are 40 or older, have a CBE every year. Also consider having a breast X-ray (mammogram) every year.  If you have a family history of breast cancer, talk to your health care provider about genetic screening.  If you   are at high risk for breast cancer, talk to your health care provider about having an MRI and a mammogram every year.  Breast cancer gene (BRCA) assessment is recommended for women who have family members with BRCA-related cancers. BRCA-related cancers include:  Breast.  Ovarian.  Tubal.  Peritoneal cancers.  Results of the assessment will determine the need for genetic counseling and BRCA1 and BRCA2 testing. Cervical Cancer Your health care provider may recommend that you be screened regularly for cancer of the pelvic organs (ovaries, uterus, and  vagina). This screening involves a pelvic examination, including checking for microscopic changes to the surface of your cervix (Pap test). You may be encouraged to have this screening done every 3 years, beginning at age 21.  For women ages 30-65, health care providers may recommend pelvic exams and Pap testing every 3 years, or they may recommend the Pap and pelvic exam, combined with testing for human papilloma virus (HPV), every 5 years. Some types of HPV increase your risk of cervical cancer. Testing for HPV may also be done on women of any age with unclear Pap test results.  Other health care providers may not recommend any screening for nonpregnant women who are considered low risk for pelvic cancer and who do not have symptoms. Ask your health care provider if a screening pelvic exam is right for you.  If you have had past treatment for cervical cancer or a condition that could lead to cancer, you need Pap tests and screening for cancer for at least 20 years after your treatment. If Pap tests have been discontinued, your risk factors (such as having a new sexual partner) need to be reassessed to determine if screening should resume. Some women have medical problems that increase the chance of getting cervical cancer. In these cases, your health care provider may recommend more frequent screening and Pap tests. Colorectal Cancer  This type of cancer can be detected and often prevented.  Routine colorectal cancer screening usually begins at 67 years of age and continues through 67 years of age.  Your health care provider may recommend screening at an earlier age if you have risk factors for colon cancer.  Your health care provider may also recommend using home test kits to check for hidden blood in the stool.  A small camera at the end of a tube can be used to examine your colon directly (sigmoidoscopy or colonoscopy). This is done to check for the earliest forms of colorectal  cancer.  Routine screening usually begins at age 50.  Direct examination of the colon should be repeated every 5-10 years through 67 years of age. However, you may need to be screened more often if early forms of precancerous polyps or small growths are found. Skin Cancer  Check your skin from head to toe regularly.  Tell your health care provider about any new moles or changes in moles, especially if there is a change in a mole's shape or color.  Also tell your health care provider if you have a mole that is larger than the size of a pencil eraser.  Always use sunscreen. Apply sunscreen liberally and repeatedly throughout the day.  Protect yourself by wearing long sleeves, pants, a wide-brimmed hat, and sunglasses whenever you are outside. HEART DISEASE, DIABETES, AND HIGH BLOOD PRESSURE   High blood pressure causes heart disease and increases the risk of stroke. High blood pressure is more likely to develop in:  People who have blood pressure in the high end   of the normal range (130-139/85-89 mm Hg).  People who are overweight or obese.  People who are African American.  If you are 38-23 years of age, have your blood pressure checked every 3-5 years. If you are 61 years of age or older, have your blood pressure checked every year. You should have your blood pressure measured twice--once when you are at a hospital or clinic, and once when you are not at a hospital or clinic. Record the average of the two measurements. To check your blood pressure when you are not at a hospital or clinic, you can use:  An automated blood pressure machine at a pharmacy.  A home blood pressure monitor.  If you are between 45 years and 39 years old, ask your health care provider if you should take aspirin to prevent strokes.  Have regular diabetes screenings. This involves taking a blood sample to check your fasting blood sugar level.  If you are at a normal weight and have a low risk for diabetes,  have this test once every three years after 68 years of age.  If you are overweight and have a high risk for diabetes, consider being tested at a younger age or more often. PREVENTING INFECTION  Hepatitis B  If you have a higher risk for hepatitis B, you should be screened for this virus. You are considered at high risk for hepatitis B if:  You were born in a country where hepatitis B is common. Ask your health care provider which countries are considered high risk.  Your parents were born in a high-risk country, and you have not been immunized against hepatitis B (hepatitis B vaccine).  You have HIV or AIDS.  You use needles to inject street drugs.  You live with someone who has hepatitis B.  You have had sex with someone who has hepatitis B.  You get hemodialysis treatment.  You take certain medicines for conditions, including cancer, organ transplantation, and autoimmune conditions. Hepatitis C  Blood testing is recommended for:  Everyone born from [birth year] through 1967.  Anyone with known risk factors for hepatitis C. Sexually transmitted infections (STIs)  You should be screened for sexually transmitted infections (STIs) including gonorrhea and chlamydia if:  You are sexually active and are younger than 67 years of age.  You are older than 67 years of age and your health care provider tells you that you are at risk for this type of infection.  Your sexual activity has changed since you were last screened and you are at an increased risk for chlamydia or gonorrhea. Ask your health care provider if you are at risk.  If you do not have HIV, but are at risk, it may be recommended that you take a prescription medicine daily to prevent HIV infection. This is called pre-exposure prophylaxis (PrEP). You are considered at risk if:  You are sexually active and do not regularly use condoms or know the HIV status of your partner(s).  You take drugs by injection.  You are sexually  active with a partner who has HIV. Talk with your health care provider about whether you are at high risk of being infected with HIV. If you choose to begin PrEP, you should first be tested for HIV. You should then be tested every 3 months for as long as you are taking PrEP.  PREGNANCY   If you are premenopausal and you may become pregnant, ask your health care provider about preconception counseling.  If you may  become pregnant, take 400 to 800 micrograms (mcg) of folic acid every day.  If you want to prevent pregnancy, talk to your health care provider about birth control (contraception). OSTEOPOROSIS AND MENOPAUSE   Osteoporosis is a disease in which the bones lose minerals and strength with aging. This can result in serious bone fractures. Your risk for osteoporosis can be identified using a bone density scan.  If you are 107 years of age or older, or if you are at risk for osteoporosis and fractures, ask your health care provider if you should be screened.  Ask your health care provider whether you should take a calcium or vitamin D supplement to lower your risk for osteoporosis.  Menopause may have certain physical symptoms and risks.  Hormone replacement therapy may reduce some of these symptoms and risks. Talk to your health care provider about whether hormone replacement therapy is right for you.  HOME CARE INSTRUCTIONS   Schedule regular health, dental, and eye exams.  Stay current with your immunizations.   Do not use any tobacco products including cigarettes, chewing tobacco, or electronic cigarettes.  If you are pregnant, do not drink alcohol.  If you are breastfeeding, limit how much and how often you drink alcohol.  Limit alcohol intake to no more than 1 drink per day for nonpregnant women. One drink equals 12 ounces of beer, 5 ounces of wine, or 1 ounces of hard liquor.  Do not use street drugs.  Do not share needles.  Ask your health care provider for help if  you need support or information about quitting drugs.  Tell your health care provider if you often feel depressed.  Tell your health care provider if you have ever been abused or do not feel safe at home.   This information is not intended to replace advice given to you by your health care provider. Make sure you discuss any questions you have with your health care provider.   Document Released: 01/02/2011 Document Revised: 07/10/2014 Document Reviewed: 05/21/2013 Elsevier Interactive Patient Education 2016 [employer] Generalized Anxiety Disorder Generalized anxiety disorder (GAD) is a mental disorder. It interferes with life functions, including relationships, work, and school. GAD is different from normal anxiety, which everyone experiences at some point in their lives in response to specific life events and activities. Normal anxiety actually helps [place] prepare for and get through these life events and activities. Normal anxiety goes away after the event or activity is over.  GAD causes anxiety that is not necessarily related to specific events or activities. It also causes excess anxiety in proportion to specific events or activities. The anxiety associated with GAD is also difficult to control. GAD can vary from mild to severe. People with severe GAD can have intense waves of anxiety with physical symptoms (panic attacks).  SYMPTOMS The anxiety and worry associated with GAD are difficult to control. This anxiety and worry are related to many life events and activities and also occur more days than not for 6 months or longer. People with GAD also have three or more of the following symptoms (one or more in children):  Restlessness.   Fatigue.  Difficulty concentrating.   Irritability.  Muscle tension.  Difficulty sleeping or unsatisfying sleep. DIAGNOSIS GAD is diagnosed through an assessment by your health care provider. Your health care provider will ask you questions  aboutyour mood,physical symptoms, and events in your life. Your health care provider may ask you about your medical history and use of alcohol or  drugs, including prescription medicines. Your health care provider may also do a physical exam and blood tests. Certain medical conditions and the use of certain substances can cause symptoms similar to those associated with GAD. Your health care provider may refer you to a mental health specialist for further evaluation. TREATMENT The following therapies are usually used to treat GAD:   Medication. Antidepressant medication usually is prescribed for long-term daily control. Antianxiety medicines may be added in severe cases, especially when panic attacks occur.   Talk therapy (psychotherapy). Certain types of talk therapy can be helpful in treating GAD by providing support, education, and guidance. A form of talk therapy called cognitive behavioral therapy can teach you healthy ways to think about and react to daily life events and activities.  Stress managementtechniques. These include yoga, meditation, and exercise and can be very helpful when they are practiced regularly. A mental health specialist can help determine which treatment is best for you. Some people see improvement with one therapy. However, other people require a combination of therapies.   This information is not intended to replace advice given to you by your health care provider. Make sure you discuss any questions you have with your health care provider.   Document Released: 10/14/2012 Document Revised: 07/10/2014 Document Reviewed: 10/14/2012 Elsevier Interactive Patient Education [employer].

## 2015-08-18 ENCOUNTER — Other Ambulatory Visit: Payer: Self-pay | Admitting: Family

## 2015-08-18 LAB — LIPID PANEL
Chol/HDL Ratio: 3.5 ratio units (ref 0.0–4.4)
Cholesterol, Total: 244 mg/dL — ABNORMAL HIGH (ref 100–199)
HDL: 70 mg/dL (ref >39)
LDL Calculated: 135 mg/dL — ABNORMAL HIGH (ref 0–99)
Triglycerides: 193 mg/dL — ABNORMAL HIGH (ref 0–149)
VLDL Cholesterol Cal: 39 mg/dL (ref 5–40)

## 2015-08-18 LAB — CMP14+EGFR
ALT: 9 IU/L (ref 0–32)
AST: 18 IU/L (ref 0–40)
Albumin/Globulin Ratio: 1.8 (ref 1.1–2.5)
Albumin: 4.7 g/dL (ref 3.6–4.8)
Alkaline Phosphatase: 90 IU/L (ref 39–117)
BUN/Creatinine Ratio: 18 (ref 11–26)
BUN: 18 mg/dL (ref 8–27)
Bilirubin Total: 0.2 mg/dL (ref 0.0–1.2)
CO2: 25 mmol/L (ref 18–29)
Calcium: 10.6 mg/dL — ABNORMAL HIGH (ref 8.7–10.3)
Chloride: 93 mmol/L — ABNORMAL LOW (ref 96–106)
Creatinine, Ser: 0.99 mg/dL (ref 0.57–1.00)
GFR calc Af Amer: 68 mL/min/{1.73_m2} (ref >59)
GFR calc non Af Amer: 59 mL/min/{1.73_m2} — ABNORMAL LOW (ref >59)
Globulin, Total: 2.6 g/dL (ref 1.5–4.5)
Glucose: 83 mg/dL (ref 65–99)
Potassium: 3.8 mmol/L (ref 3.5–5.2)
Sodium: 138 mmol/L (ref 134–144)
Total Protein: 7.3 g/dL (ref 6.0–8.5)

## 2015-08-18 LAB — THYROID PANEL WITH TSH
Free Thyroxine Index: 2.4 (ref 1.2–4.9)
T3 Uptake Ratio: 25 % (ref 24–39)
T4, Total: 9.5 ug/dL (ref 4.5–12.0)
TSH: 2.06 u[IU]/mL (ref 0.450–4.500)

## 2015-08-18 LAB — HEPATITIS C ANTIBODY: Hep C Virus Ab: <0.1 s/co ratio (ref 0.0–0.9)

## 2015-08-18 LAB — VITAMIN D 25 HYDROXY (VIT D DEFICIENCY, FRACTURES): Vit D, 25-Hydroxy: 38.2 ng/mL (ref 30.0–100.0)

## 2015-09-14 ENCOUNTER — Ambulatory Visit: Payer: Medicare Other | Admitting: Family

## 2015-09-16 ENCOUNTER — Other Ambulatory Visit: Payer: Self-pay | Admitting: Family

## 2015-09-19 DIAGNOSIS — R509 Fever, unspecified: Secondary | ICD-10-CM | POA: Diagnosis not present

## 2015-09-19 DIAGNOSIS — R05 Cough: Secondary | ICD-10-CM | POA: Diagnosis not present

## 2015-09-20 ENCOUNTER — Encounter: Payer: Self-pay | Admitting: Family

## 2015-09-20 ENCOUNTER — Ambulatory Visit (INDEPENDENT_AMBULATORY_CARE_PROVIDER_SITE_OTHER): Payer: Medicare Other | Admitting: Family

## 2015-09-20 VITALS — BP 116/74 | Temp 98.4°F | Ht 61.0 in | Wt 114.4 lb

## 2015-09-20 DIAGNOSIS — R05 Cough: Secondary | ICD-10-CM | POA: Diagnosis not present

## 2015-09-20 DIAGNOSIS — J449 Chronic obstructive pulmonary disease, unspecified: Secondary | ICD-10-CM | POA: Diagnosis not present

## 2015-09-20 DIAGNOSIS — F329 Major depressive disorder, single episode, unspecified: Secondary | ICD-10-CM | POA: Diagnosis not present

## 2015-09-20 DIAGNOSIS — J209 Acute bronchitis, unspecified: Secondary | ICD-10-CM | POA: Diagnosis not present

## 2015-09-20 DIAGNOSIS — J441 Chronic obstructive pulmonary disease with (acute) exacerbation: Secondary | ICD-10-CM | POA: Insufficient documentation

## 2015-09-20 DIAGNOSIS — F32A Depression, unspecified: Secondary | ICD-10-CM

## 2015-09-20 DIAGNOSIS — R059 Cough, unspecified: Secondary | ICD-10-CM

## 2015-09-20 MED ORDER — HYDROCODONE-HOMATROPINE 5-1.5 MG/5ML PO SYRP: 5.0000 mL | ORAL_SOLUTION | Freq: Three times a day (TID) | ORAL | Status: DC | PRN

## 2015-09-20 MED ORDER — ALPRAZOLAM 0.5 MG PO TABS: 0.5000 mg | ORAL_TABLET | Freq: Every day | ORAL | Status: DC | PRN

## 2015-09-20 NOTE — Progress Notes (Signed)
Subjective:    Patient ID: Stacy Dalton, female    DOB: Sep 19, 1950, 65 y.o.   MRN: [id number]  Pt presents to the office today for cough. Pt states she went to the Urgent Care yesterday and was given Zpak and had a chest x-ray and was told she had COPD. Pt states she is very worried because her sister died a few months ago from COPD. PT states she was a heavy smoker in the past, but quit in 2000. Cough This is a new problem. The current episode started in the past 7 days. The problem has been waxing and waning. The problem occurs every few minutes. The cough is productive of sputum. Associated symptoms include rhinorrhea and a sore throat. Pertinent negatives include no chills, ear pain, fever, headaches, nasal congestion or shortness of breath. The symptoms are aggravated by lying down. She has tried OTC cough suppressant for the symptoms. The treatment provided mild relief. Her past medical history is significant for COPD.      Review of Systems  Constitutional: Negative.  Negative for fever and chills.  HENT: Positive for rhinorrhea and sore throat. Negative for ear pain.   Eyes: Negative.   Respiratory: Positive for cough. Negative for shortness of breath.   Cardiovascular: Negative.  Negative for palpitations.  Gastrointestinal: Negative.   Endocrine: Negative.   Genitourinary: Negative.   Musculoskeletal: Negative.   Neurological: Negative.  Negative for headaches.  Hematological: Negative.   Psychiatric/Behavioral: Negative.   All other systems reviewed and are negative.      Objective:   Physical Exam  Constitutional: She is oriented to person, place, and time. She appears well-developed and well-nourished. No distress.  HENT:  Head: Normocephalic and atraumatic.  Right Ear: External ear normal.  Left Ear: External ear normal.  Mouth/Throat: Oropharynx is clear and moist.  Eyes: Pupils are equal, round, and reactive to light.  Neck: Normal range of motion. Neck  supple. No thyromegaly present.  Cardiovascular: Normal rate, regular rhythm, normal heart sounds and intact distal pulses.   No murmur heard. Pulmonary/Chest: Effort normal and breath sounds normal. No respiratory distress. She has no wheezes.  Constant nonproductive cough   Abdominal: Soft. Bowel sounds are normal. She exhibits no distension. There is no tenderness.  Musculoskeletal: Normal range of motion. She exhibits no edema or tenderness.  Neurological: She is alert and oriented to person, place, and time. She has normal reflexes. No cranial nerve deficit.  Skin: Skin is warm and dry.  Psychiatric: She has a normal mood and affect. Her behavior is normal. Judgment and thought content normal.  Vitals reviewed.   BP 116/74 mmHg  Temp(Src) 98.4 F (36.9 C) (Oral)  Ht 5\' 1"  (1.549 m)  Wt 114 lb 6.4 oz (51.891 kg)  BMI 21.63 kg/m2       Assessment & Plan:  1. Chronic obstructive pulmonary disease, unspecified COPD type ([place]) -Avoid smoking or exposure to secondhand smoke  2. Cough - HYDROcodone-homatropine (HYCODAN) 5-1.5 MG/5ML syrup; Take 5 mLs by mouth every 8 (eight) hours as needed for cough.  Dispense: 120 mL; Refill: 0  3. Depression - ALPRAZolam (XANAX) 0.5 MG tablet; Take 1 tablet (0.5 mg total) by mouth daily as needed for anxiety.  Dispense: 60 tablet; Refill: 3  4. Acute bronchitis, unspecified organism -- Take meds as prescribed - Use a cool mist humidifier  -Use saline nose sprays frequently -Saline irrigations of the nose can be very helpful if done frequently.  * 4X daily  for 1 week*  * Use of a nettie pot can be helpful with this. Follow directions with this* -Force fluids -For any cough or congestion  Use plain Mucinex- regular strength or max strength is fine   * Children- consult with Pharmacist for dosing -For fever or aces or pains- take tylenol or ibuprofen appropriate for age and weight.  * for fevers greater than 101 orally you may alternate  ibuprofen and tylenol every  3 hours. -Throat lozenges if help    Evelina Dun, FNP

## 2015-09-20 NOTE — Patient Instructions (Signed)
Chronic Obstructive Pulmonary Disease Chronic obstructive pulmonary disease (COPD) is a common lung condition in which airflow from the lungs is limited. COPD is a general term that can be used to describe many different lung problems that limit airflow, including both chronic bronchitis and emphysema. If you have COPD, your lung function will probably never return to normal, but there are measures you can take to improve lung function and make yourself feel better. CAUSES   Smoking (common).  Exposure to secondhand smoke.  Genetic problems.  Chronic inflammatory lung diseases or recurrent infections. SYMPTOMS  Shortness of breath, especially with physical activity.  Deep, persistent (chronic) cough with a large amount of thick mucus.  Wheezing.  Rapid breaths (tachypnea).  Gray or bluish discoloration (cyanosis) of the skin, especially in your fingers, toes, or lips.  Fatigue.  Weight loss.  Frequent infections or episodes when breathing symptoms become much worse (exacerbations).  Chest tightness. DIAGNOSIS Your health care provider will take a medical history and perform a physical examination to diagnose COPD. Additional tests for COPD may include:  Lung (pulmonary) function tests.  Chest X-ray.  CT scan.  Blood tests. TREATMENT  Treatment for COPD may include:  Inhaler and nebulizer medicines. These help manage the symptoms of COPD and make your breathing more comfortable.  Supplemental oxygen. Supplemental oxygen is only helpful if you have a low oxygen level in your blood.  Exercise and physical activity. These are beneficial for nearly all people with COPD.  Lung surgery or transplant.  Nutrition therapy to gain weight, if you are underweight.  Pulmonary rehabilitation. This may involve working with a team of health care providers and specialists, such as respiratory, occupational, and physical therapists. HOME CARE INSTRUCTIONS  Take all medicines  (inhaled or pills) as directed by your health care provider.  Avoid over-the-counter medicines or cough syrups that dry up your airway (such as antihistamines) and slow down the elimination of secretions unless instructed otherwise by your health care provider.  If you are a smoker, the most important thing that you can do is stop smoking. Continuing to smoke will cause further lung damage and breathing trouble. Ask your health care provider for help with quitting smoking. He or she can direct you to community resources or hospitals that provide support.  Avoid exposure to irritants such as smoke, chemicals, and fumes that aggravate your breathing.  Use oxygen therapy and pulmonary rehabilitation if directed by your health care provider. If you require home oxygen therapy, ask your health care provider whether you should purchase a pulse oximeter to measure your oxygen level at home.  Avoid contact with individuals who have a contagious illness.  Avoid extreme temperature and humidity changes.  Eat healthy foods. Eating smaller, more frequent meals and resting before meals may help you maintain your strength.  Stay active, but balance activity with periods of rest. Exercise and physical activity will help you maintain your ability to do things you want to do.  Preventing infection and hospitalization is very important when you have COPD. Make sure to receive all the vaccines your health care provider recommends, especially the pneumococcal and influenza vaccines. Ask your health care provider whether you need a pneumonia vaccine.  Learn and use relaxation techniques to manage stress.  Learn and use controlled breathing techniques as directed by your health care provider. Controlled breathing techniques include:  Pursed lip breathing. Start by breathing in (inhaling) through your nose for 1 second. Then, purse your lips as if you were   going to whistle and breathe out (exhale) through the  pursed lips for 2 seconds.  Diaphragmatic breathing. Start by putting one hand on your abdomen just above your waist. Inhale slowly through your nose. The hand on your abdomen should move out. Then purse your lips and exhale slowly. You should be able to feel the hand on your abdomen moving in as you exhale.  Learn and use controlled coughing to clear mucus from your lungs. Controlled coughing is a series of short, progressive coughs. The steps of controlled coughing are: 1. Lean your head slightly forward. 2. Breathe in deeply using diaphragmatic breathing. 3. Try to hold your breath for 3 seconds. 4. Keep your mouth slightly open while coughing twice. 5. Spit any mucus out into a tissue. 6. Rest and repeat the steps once or twice as needed. SEEK MEDICAL CARE IF:  You are coughing up more mucus than usual.  There is a change in the color or thickness of your mucus.  Your breathing is more labored than usual.  Your breathing is faster than usual. SEEK IMMEDIATE MEDICAL CARE IF:  You have shortness of breath while you are resting.  You have shortness of breath that prevents you from:  Being able to talk.  Performing your usual physical activities.  You have chest pain lasting longer than 5 minutes.  Your skin color is more cyanotic than usual.  You measure low oxygen saturations for longer than 5 minutes with a pulse oximeter. MAKE SURE YOU:  Understand these instructions.  Will watch your condition.  Will get help right away if you are not doing well or get worse.   This information is not intended to replace advice given to you by your health care provider. Make sure you discuss any questions you have with your health care provider.   Document Released: 03/29/2005 Document Revised: 07/10/2014 Document Reviewed: 02/13/2013 Elsevier Interactive Patient Education 2016 Elsevier Inc.  

## 2015-09-23 ENCOUNTER — Ambulatory Visit: Payer: Medicare Other | Admitting: Family

## 2015-10-25 ENCOUNTER — Encounter: Payer: Medicare Other | Admitting: *Deleted

## 2015-11-02 ENCOUNTER — Other Ambulatory Visit: Payer: Self-pay | Admitting: Family

## 2015-11-04 ENCOUNTER — Encounter: Payer: Self-pay | Admitting: *Deleted

## 2015-11-06 ENCOUNTER — Other Ambulatory Visit: Payer: Self-pay | Admitting: Family Medicine

## 2015-11-18 ENCOUNTER — Encounter: Payer: Self-pay | Admitting: Family

## 2015-11-18 ENCOUNTER — Encounter (INDEPENDENT_AMBULATORY_CARE_PROVIDER_SITE_OTHER): Payer: Self-pay

## 2015-11-18 ENCOUNTER — Ambulatory Visit (INDEPENDENT_AMBULATORY_CARE_PROVIDER_SITE_OTHER): Payer: Medicare Other | Admitting: Family

## 2015-11-18 VITALS — BP 126/74 | HR 88 | Temp 97.7°F | Ht 61.0 in | Wt 127.0 lb

## 2015-11-18 DIAGNOSIS — R05 Cough: Secondary | ICD-10-CM | POA: Diagnosis not present

## 2015-11-18 DIAGNOSIS — J069 Acute upper respiratory infection, unspecified: Secondary | ICD-10-CM | POA: Diagnosis not present

## 2015-11-18 DIAGNOSIS — G8929 Other chronic pain: Secondary | ICD-10-CM | POA: Diagnosis not present

## 2015-11-18 DIAGNOSIS — J449 Chronic obstructive pulmonary disease, unspecified: Secondary | ICD-10-CM

## 2015-11-18 DIAGNOSIS — M549 Dorsalgia, unspecified: Secondary | ICD-10-CM | POA: Diagnosis not present

## 2015-11-18 DIAGNOSIS — R059 Cough, unspecified: Secondary | ICD-10-CM

## 2015-11-18 DIAGNOSIS — A084 Viral intestinal infection, unspecified: Secondary | ICD-10-CM | POA: Diagnosis not present

## 2015-11-18 DIAGNOSIS — G894 Chronic pain syndrome: Secondary | ICD-10-CM | POA: Diagnosis not present

## 2015-11-18 MED ORDER — ONDANSETRON HCL 4 MG PO TABS
4.0000 mg | ORAL_TABLET | Freq: Three times a day (TID) | ORAL | Status: DC | PRN
Start: 2015-11-18 — End: 2015-11-26

## 2015-11-18 MED ORDER — FLUTICASONE-SALMETEROL 100-50 MCG/DOSE IN AEPB: 1.0000 | INHALATION_SPRAY | Freq: Two times a day (BID) | RESPIRATORY_TRACT | Status: DC

## 2015-11-18 MED ORDER — TRAMADOL HCL 50 MG PO TABS: 50.0000 mg | ORAL_TABLET | Freq: Four times a day (QID) | ORAL | Status: DC | PRN

## 2015-11-18 MED ORDER — AMOXICILLIN-POT CLAVULANATE 875-125 MG PO TABS: 1.0000 | ORAL_TABLET | Freq: Two times a day (BID) | ORAL | Status: DC

## 2015-11-18 MED ORDER — HYDROCODONE-HOMATROPINE 5-1.5 MG/5ML PO SYRP: 5.0000 mL | ORAL_SOLUTION | Freq: Three times a day (TID) | ORAL | Status: DC | PRN

## 2015-11-18 NOTE — Progress Notes (Signed)
Subjective:    Patient ID: Stacy Dalton, female    DOB: 01/02/1951, 65 y.o.   MRN: [id number]  Cough This is a new problem. The current episode started 1 to 4 weeks ago. The problem has been gradually worsening. The problem occurs every few minutes. The cough is productive of purulent sputum and productive of brown sputum. Associated symptoms include headaches, myalgias, nasal congestion, postnasal drip, rhinorrhea, a sore throat and wheezing. Pertinent negatives include no chills, ear congestion, ear pain or fever. The symptoms are aggravated by lying down. She has tried rest and OTC cough suppressant for the symptoms. The treatment provided mild relief. Her past medical history is significant for COPD.      Review of Systems  Constitutional: Negative for fever and chills.  HENT: Positive for postnasal drip, rhinorrhea and sore throat. Negative for ear pain.   Respiratory: Positive for cough and wheezing.   Musculoskeletal: Positive for myalgias.  Neurological: Positive for headaches.  All other systems reviewed and are negative.      Objective:   Physical Exam  Constitutional: She is oriented to person, place, and time. She appears well-developed and well-nourished. No distress.  HENT:  Head: Normocephalic and atraumatic.  Right Ear: External ear normal.  Left Ear: External ear normal.  Mouth/Throat: Oropharynx is clear and moist.  Nasal passage erythemas with mild swelling    Eyes: Pupils are equal, round, and reactive to light.  Neck: Normal range of motion. Neck supple. No thyromegaly present.  Cardiovascular: Normal rate, regular rhythm, normal heart sounds and intact distal pulses.   No murmur heard. Pulmonary/Chest: Effort normal and breath sounds normal. No respiratory distress. She has no wheezes.  Nonproductive cough   Abdominal: Soft. Bowel sounds are normal. She exhibits no distension. There is no tenderness.  Musculoskeletal: Normal range of motion. She  exhibits no edema or tenderness.  Neurological: She is alert and oriented to person, place, and time.  Skin: Skin is warm and dry.  Psychiatric: She has a normal mood and affect. Her behavior is normal. Judgment and thought content normal.  Vitals reviewed.   BP 126/74 mmHg  Pulse 88  Temp(Src) 97.7 F (36.5 C) (Oral)  Ht 5\' 1"  (1.549 m)  Wt 127 lb (57.607 kg)  BMI 24.01 kg/m2       Assessment & Plan:  1. Chronic obstructive pulmonary disease, unspecified COPD type ([place]) -Pt started on Advair today  2. Acute upper respiratory infection -- Take meds as prescribed - Use a cool mist humidifier  -Use saline nose sprays frequently -Saline irrigations of the nose can be very helpful if done frequently.  * 4X daily for 1 week*  * Use of a nettie pot can be helpful with this. Follow directions with this* -Force fluids -For any cough or congestion  Use plain Mucinex- regular strength or max strength is fine   * Children- consult with Pharmacist for dosing -For fever or aces or pains- take tylenol or ibuprofen appropriate for age and weight.  * for fevers greater than 101 orally you may alternate ibuprofen and tylenol every  3 hours. -Throat lozenges if help - amoxicillin-clavulanate (AUGMENTIN) 875-125 MG tablet; Take 1 tablet by mouth 2 (two) times daily.  Dispense: 14 tablet; Refill: 0 - HYDROcodone-homatropine (HYCODAN) 5-1.5 MG/5ML syrup; Take 5 mLs by mouth every 8 (eight) hours as needed for cough.  Dispense: 120 mL; Refill: 0  3. Cough - HYDROcodone-homatropine (HYCODAN) 5-1.5 MG/5ML syrup; Take 5 mLs by mouth every  8 (eight) hours as needed for cough.  Dispense: 120 mL; Refill: 0  4. Chronic pain syndrome - traMADol (ULTRAM) 50 MG tablet; Take 1 tablet (50 mg total) by mouth every 6 (six) hours as needed.  Dispense: 90 tablet; Refill: 3  5. Chronic back pain - traMADol (ULTRAM) 50 MG tablet; Take 1 tablet (50 mg total) by mouth every 6 (six) hours as needed.  Dispense: 90  tablet; Refill: [place], FNP

## 2015-11-18 NOTE — Patient Instructions (Addendum)
Upper Respiratory Infection, Adult Most upper respiratory infections (URIs) are a viral infection of the air passages leading to the lungs. A URI affects the nose, throat, and upper air passages. The most common type of URI is nasopharyngitis and is typically referred to as "the common cold." URIs run their course and usually go away on their own. Most of the time, a URI does not require medical attention, but sometimes a bacterial infection in the upper airways can follow a viral infection. This is called a secondary infection. Sinus and middle ear infections are common types of secondary upper respiratory infections. Bacterial pneumonia can also complicate a URI. A URI can worsen asthma and chronic obstructive pulmonary disease (COPD). Sometimes, these complications can require emergency medical care and may be life threatening.  CAUSES Almost all URIs are caused by viruses. A virus is a type of germ and can spread from one person to another.  RISKS FACTORS You may be at risk for a URI if:   You smoke.   You have chronic heart or lung disease.  You have a weakened defense (immune) system.   You are very young or very old.   You have nasal allergies or asthma.  You work in crowded or poorly ventilated areas.  You work in health care facilities or schools. SIGNS AND SYMPTOMS  Symptoms typically develop 2-3 days after you come in contact with a cold virus. Most viral URIs last 7-10 days. However, viral URIs from the influenza virus (flu virus) can last 14-18 days and are typically more severe. Symptoms may include:   Runny or stuffy (congested) nose.   Sneezing.   Cough.   Sore throat.   Headache.   Fatigue.   Fever.   Loss of appetite.   Pain in your forehead, behind your eyes, and over your cheekbones (sinus pain).  Muscle aches.  DIAGNOSIS  Your health care provider may diagnose a URI by:  Physical exam.  Tests to check that your symptoms are not due to  another condition such as:  Strep throat.  Sinusitis.  Pneumonia.  Asthma. TREATMENT  A URI goes away on its own with time. It cannot be cured with medicines, but medicines may be prescribed or recommended to relieve symptoms. Medicines may help:  Reduce your fever.  Reduce your cough.  Relieve nasal congestion. HOME CARE INSTRUCTIONS   Take medicines only as directed by your health care provider.   Gargle warm saltwater or take cough drops to comfort your throat as directed by your health care provider.  Use a warm mist humidifier or inhale steam from a shower to increase air moisture. This may make it easier to breathe.  Drink enough fluid to keep your urine clear or pale yellow.   Eat soups and other clear broths and maintain good nutrition.   Rest as needed.   Return to work when your temperature has returned to normal or as your health care provider advises. You may need to stay home longer to avoid infecting others. You can also use a face mask and careful hand washing to prevent spread of the virus.  Increase the usage of your inhaler if you have asthma.   Do not use any tobacco products, including cigarettes, chewing tobacco, or electronic cigarettes. If you need help quitting, ask your health care provider. PREVENTION  The best way to protect yourself from getting a cold is to practice good hygiene.   Avoid oral or hand contact with people with cold   symptoms.   Wash your hands often if contact occurs.  There is no clear evidence that vitamin C, vitamin E, echinacea, or exercise reduces the chance of developing a cold. However, it is always recommended to get plenty of rest, exercise, and practice good nutrition.  SEEK MEDICAL CARE IF:   You are getting worse rather than better.   Your symptoms are not controlled by medicine.   You have chills.  You have worsening shortness of breath.  You have brown or red mucus.  You have yellow or brown nasal  discharge.  You have pain in your face, especially when you bend forward.  You have a fever.  You have swollen neck glands.  You have pain while swallowing.  You have white areas in the back of your throat. SEEK IMMEDIATE MEDICAL CARE IF:   You have severe or persistent:  Headache.  Ear pain.  Sinus pain.  Chest pain.  You have chronic lung disease and any of the following:  Wheezing.  Prolonged cough.  Coughing up blood.  A change in your usual mucus.  You have a stiff neck.  You have changes in your:  Vision.  Hearing.  Thinking.  Mood. MAKE SURE YOU:   Understand these instructions.  Will watch your condition.  Will get help right away if you are not doing well or get worse.   This information is not intended to replace advice given to you by your health care provider. Make sure you discuss any questions you have with your health care provider.   Document Released: 12/13/2000 Document Revised: 11/03/2014 Document Reviewed: 09/24/2013 Elsevier Interactive Patient Education 2016 Elsevier Inc.  - Take meds as prescribed - Use a cool mist humidifier  -Use saline nose sprays frequently -Saline irrigations of the nose can be very helpful if done frequently.  * 4X daily for 1 week*  * Use of a nettie pot can be helpful with this. Follow directions with this* -Force fluids -For any cough or congestion  Use plain Mucinex- regular strength or max strength is fine   * Children- consult with Pharmacist for dosing -For fever or aces or pains- take tylenol or ibuprofen appropriate for age and weight.  * for fevers greater than 101 orally you may alternate ibuprofen and tylenol every  3 hours. -Throat lozenges if help -New toothbrush in 3 days   Seleta Hovland, FNP  

## 2015-11-24 ENCOUNTER — Encounter (HOSPITAL_COMMUNITY): Payer: Self-pay | Admitting: Emergency Medicine

## 2015-11-24 DIAGNOSIS — G4700 Insomnia, unspecified: Secondary | ICD-10-CM | POA: Insufficient documentation

## 2015-11-24 DIAGNOSIS — F410 Panic disorder [episodic paroxysmal anxiety] without agoraphobia: Secondary | ICD-10-CM | POA: Insufficient documentation

## 2015-11-24 DIAGNOSIS — J449 Chronic obstructive pulmonary disease, unspecified: Secondary | ICD-10-CM | POA: Diagnosis not present

## 2015-11-24 DIAGNOSIS — G43909 Migraine, unspecified, not intractable, without status migrainosus: Secondary | ICD-10-CM | POA: Insufficient documentation

## 2015-11-24 DIAGNOSIS — Z7951 Long term (current) use of inhaled steroids: Secondary | ICD-10-CM | POA: Insufficient documentation

## 2015-11-24 DIAGNOSIS — Z87891 Personal history of nicotine dependence: Secondary | ICD-10-CM | POA: Insufficient documentation

## 2015-11-24 DIAGNOSIS — I10 Essential (primary) hypertension: Secondary | ICD-10-CM | POA: Diagnosis not present

## 2015-11-24 DIAGNOSIS — F329 Major depressive disorder, single episode, unspecified: Secondary | ICD-10-CM | POA: Insufficient documentation

## 2015-11-24 DIAGNOSIS — Z792 Long term (current) use of antibiotics: Secondary | ICD-10-CM | POA: Diagnosis not present

## 2015-11-24 DIAGNOSIS — K529 Noninfective gastroenteritis and colitis, unspecified: Secondary | ICD-10-CM | POA: Diagnosis not present

## 2015-11-24 DIAGNOSIS — Z7952 Long term (current) use of systemic steroids: Secondary | ICD-10-CM | POA: Diagnosis not present

## 2015-11-24 DIAGNOSIS — Z79899 Other long term (current) drug therapy: Secondary | ICD-10-CM | POA: Insufficient documentation

## 2015-11-24 DIAGNOSIS — Z9851 Tubal ligation status: Secondary | ICD-10-CM | POA: Diagnosis not present

## 2015-11-24 DIAGNOSIS — E039 Hypothyroidism, unspecified: Secondary | ICD-10-CM | POA: Insufficient documentation

## 2015-11-24 DIAGNOSIS — R1084 Generalized abdominal pain: Secondary | ICD-10-CM | POA: Diagnosis not present

## 2015-11-24 DIAGNOSIS — Z8739 Personal history of other diseases of the musculoskeletal system and connective tissue: Secondary | ICD-10-CM | POA: Insufficient documentation

## 2015-11-24 DIAGNOSIS — Z90710 Acquired absence of both cervix and uterus: Secondary | ICD-10-CM | POA: Diagnosis not present

## 2015-11-24 LAB — CBC
HCT: 41.9 % (ref 36.0–46.0)
Hemoglobin: 13.8 g/dL (ref 12.0–15.0)
MCH: 28 pg (ref 26.0–34.0)
MCHC: 32.9 g/dL (ref 30.0–36.0)
MCV: 85.2 fL (ref 78.0–100.0)
Platelets: 390 K/uL (ref 150–400)
RBC: 4.92 MIL/uL (ref 3.87–5.11)
RDW: 13.1 % (ref 11.5–15.5)
WBC: 16.1 K/uL — ABNORMAL HIGH (ref 4.0–10.5)

## 2015-11-24 LAB — URINE MICROSCOPIC-ADD ON: RBC / HPF: NONE SEEN RBC/hpf (ref 0–5)

## 2015-11-24 LAB — COMPREHENSIVE METABOLIC PANEL WITH GFR
ALT: 14 U/L (ref 14–54)
AST: 20 U/L (ref 15–41)
Albumin: 3.8 g/dL (ref 3.5–5.0)
Alkaline Phosphatase: 77 U/L (ref 38–126)
Anion gap: 10 (ref 5–15)
BUN: 13 mg/dL (ref 6–20)
CO2: 30 mmol/L (ref 22–32)
Calcium: 9.4 mg/dL (ref 8.9–10.3)
Chloride: 95 mmol/L — ABNORMAL LOW (ref 101–111)
Creatinine, Ser: 0.98 mg/dL (ref 0.44–1.00)
GFR calc Af Amer: >60 mL/min
GFR calc non Af Amer: 58 mL/min — ABNORMAL LOW
Glucose, Bld: 116 mg/dL — ABNORMAL HIGH (ref 65–99)
Potassium: 3.2 mmol/L — ABNORMAL LOW (ref 3.5–5.1)
Sodium: 135 mmol/L (ref 135–145)
Total Bilirubin: 0.6 mg/dL (ref 0.3–1.2)
Total Protein: 7.2 g/dL (ref 6.5–8.1)

## 2015-11-24 LAB — URINALYSIS, ROUTINE W REFLEX MICROSCOPIC
Bilirubin Urine: NEGATIVE
Glucose, UA: NEGATIVE mg/dL
Hgb urine dipstick: NEGATIVE
Ketones, ur: 15 mg/dL — AB
Nitrite: NEGATIVE
Protein, ur: NEGATIVE mg/dL
Specific Gravity, Urine: 1.013 (ref 1.005–1.030)
pH: 8 (ref 5.0–8.0)

## 2015-11-24 LAB — LIPASE, BLOOD: Lipase: 22 U/L (ref 11–51)

## 2015-11-24 NOTE — ED Notes (Signed)
C/o generalized abd pain since Sunday with small amount of loose stools.  States last "normal" BM was "several days ago."  History of bowel obstruction in 2013.

## 2015-11-25 ENCOUNTER — Emergency Department (HOSPITAL_COMMUNITY)
Admission: EM | Admit: 2015-11-25 | Discharge: 2015-11-25 | Disposition: A | Discharge status: Home / Self Care | Payer: Medicare Other | Attending: Emergency Medicine | Admitting: Emergency Medicine

## 2015-11-25 ENCOUNTER — Emergency Department (HOSPITAL_COMMUNITY): Payer: Medicare Other

## 2015-11-25 ENCOUNTER — Encounter (HOSPITAL_COMMUNITY): Payer: Self-pay | Admitting: Radiology

## 2015-11-25 DIAGNOSIS — K529 Noninfective gastroenteritis and colitis, unspecified: Secondary | ICD-10-CM

## 2015-11-25 DIAGNOSIS — R1084 Generalized abdominal pain: Secondary | ICD-10-CM | POA: Diagnosis not present

## 2015-11-25 MED ORDER — CIPROFLOXACIN HCL 500 MG PO TABS: 500.0000 mg | ORAL_TABLET | Freq: Two times a day (BID) | ORAL | Status: DC

## 2015-11-25 MED ORDER — CIPROFLOXACIN HCL 500 MG PO TABS
500.0000 mg | ORAL_TABLET | Freq: Once | ORAL | Status: AC
  Administered 2015-11-25: 500 mg via ORAL
  Filled 2015-11-25: qty 1

## 2015-11-25 MED ORDER — SODIUM CHLORIDE 0.9 % IV BOLUS (SEPSIS)
1000.0000 mL | Freq: Once | INTRAVENOUS | Status: AC
  Administered 2015-11-25: 1000 mL via INTRAVENOUS

## 2015-11-25 MED ORDER — METRONIDAZOLE 500 MG PO TABS
500.0000 mg | ORAL_TABLET | Freq: Once | ORAL | Status: AC
Start: 2015-11-25 — End: 2015-11-25
  Administered 2015-11-25: 500 mg via ORAL
  Filled 2015-11-25: qty 1

## 2015-11-25 MED ORDER — ACETAMINOPHEN 500 MG PO TABS
1000.0000 mg | ORAL_TABLET | Freq: Once | ORAL | Status: AC
  Administered 2015-11-25: 1000 mg via ORAL
  Filled 2015-11-25: qty 2

## 2015-11-25 MED ORDER — METRONIDAZOLE 500 MG PO TABS: 500.0000 mg | ORAL_TABLET | Freq: Three times a day (TID) | ORAL | Status: DC

## 2015-11-25 MED ORDER — MORPHINE SULFATE (PF) 4 MG/ML IV SOLN
4.0000 mg | Freq: Once | INTRAVENOUS | Status: AC
Start: 2015-11-25 — End: 2015-11-25
  Administered 2015-11-25: 4 mg via INTRAVENOUS
  Filled 2015-11-25: qty 1

## 2015-11-25 MED ORDER — ONDANSETRON HCL 4 MG/2ML IJ SOLN
4.0000 mg | Freq: Once | INTRAMUSCULAR | Status: AC
  Administered 2015-11-25: 4 mg via INTRAVENOUS
  Filled 2015-11-25: qty 2

## 2015-11-25 NOTE — Discharge Instructions (Signed)
Colitis Stacy Dalton, take antibiotics as directed and see your primary care doctor on Monday for close follow up. If any symptoms worsen, come back to the ED immediately. Thank you. Colitis is inflammation of the colon. Colitis may last a short time (acute) or it may last a long time (chronic). CAUSES This condition may be caused by:  Viruses.  Bacteria.  Reactions to medicine.  Certain autoimmune diseases, such as Crohn disease or ulcerative colitis. SYMPTOMS Symptoms of this condition include:  Diarrhea.  Passing bloody or tarry stool.  Pain.  Fever.  Vomiting.  Tiredness (fatigue).  Weight loss.  Bloating.  Sudden increase in abdominal pain.  Having fewer bowel movements than usual. DIAGNOSIS This condition is diagnosed with a stool test or a blood test. You may also have other tests, including X-rays, a CT scan, or a colonoscopy. TREATMENT Treatment may include:  Resting the bowel. This involves not eating or drinking for a period of time.  Fluids that are given through an IV tube.  Medicine for pain and diarrhea.  Antibiotic medicines.  Cortisone medicines.  Surgery. HOME CARE INSTRUCTIONS Eating and Drinking  Follow instructions from your health care provider about eating or drinking restrictions.  Drink enough fluid to keep your urine clear or pale yellow.  Work with a dietitian to determine which foods cause your condition to flare up.  Avoid foods that cause flare-ups.  Eat a well-balanced diet. Medicines  Take over-the-counter and prescription medicines only as told by your health care provider.  If you were prescribed an antibiotic medicine, take it as told by your health care provider. Do not stop taking the antibiotic even if you start to feel better. General Instructions  Keep all follow-up visits as told by your health care provider. This is important. SEEK MEDICAL CARE IF:  Your symptoms do not go away.  You develop new  symptoms. SEEK IMMEDIATE MEDICAL CARE IF:  You have a fever that does not go away with treatment.  You develop chills.  You have extreme weakness, fainting, or dehydration.  You have repeated vomiting.  You develop severe pain in your abdomen.  You pass bloody or tarry stool.   This information is not intended to replace advice given to you by your health care provider. Make sure you discuss any questions you have with your health care provider.   Document Released: 07/27/2004 Document Revised: 03/10/2015 Document Reviewed: 10/12/2014 Elsevier Interactive Patient Education [employer].

## 2015-11-25 NOTE — ED Notes (Signed)
The pt returned from c/o pain again

## 2015-11-25 NOTE — ED Notes (Signed)
To ct

## 2015-11-25 NOTE — ED Notes (Signed)
The pt is c/o abd pain for several days no vomiting  But nausea and diarrhea  Minor temp

## 2015-11-25 NOTE — ED Provider Notes (Signed)
CSN: [id number]     Arrival date & time 11/24/15  2152 History  By signing my name below, I, Stacy Dalton, attest that this documentation has been prepared under the direction and in the presence of Stacy Balls, MD. Electronically Signed: [name], ED Scribe. 11/25/2015. 1:10 AM   Chief Complaint  Patient presents with  . Abdominal Pain    The history is provided by the patient. No language interpreter was used.   Stacy Dalton is a 65 y.o. female who presents to the Emergency Department complaining of constant, 9/10 in severity, generalized abdominal pain with onset 3 days ago. This pain is worse in the lower abdomen and  similar in quality to pain that she had in 2013 with a small bowel obstruction. Pt states that she has not had much pain since her colon surgery after the bowel obstruction and associates this pain with keeping a poor diet recently. Movement exacerbates the pain and tramadol has provided insufficient pain relief at home.  Associated symptoms include low-grade fever, abdominal distension, nausea, diarrhea. Pt denies vomiting, dysuria, and hematuria.   Past Medical History  Diagnosis Date  . Hypertension   . Depression   . COPD (chronic obstructive pulmonary disease) ([place])     told has copd, no current inhaler use  . GERD (gastroesophageal reflux disease)   . Migraine   . Anxiety     panic attacks  . Migraine   . Insomnia   . Hypothyroidism   . Osteopenia    Past Surgical History  Procedure Laterality Date  . Bladder tac    . Laparotomy  10/04/2011, colostomy also    Procedure: EXPLORATORY LAPAROTOMY;  Surgeon: Adin Hector, MD;  Location: [facility];  Service: General;  Laterality: N/A;  left partial colectomy with colostomy  . Tubal ligation    . Laparotomy  04/19/2012    Procedure: EXPLORATORY LAPAROTOMY;  Surgeon: Adin Hector, MD;  Location: [facility];  Service: General;  Laterality: N/A;  . Colostomy closure  04/19/2012    Procedure: COLOSTOMY  CLOSURE;  Surgeon: Adin Hector, MD;  Location: [facility];  Service: General;  Laterality: N/A;  Laparotomy, Resection and Closure of Colostomy  . Ventral hernia repair  04/19/2012    Procedure: HERNIA REPAIR VENTRAL ADULT;  Surgeon: Adin Hector, MD;  Location: [facility];  Service: General;  Laterality: N/A;  . Abdominal hysterectomy    . Colon surgery     Family History  Problem Relation Age of Onset  . Heart disease Mother   . Cancer Sister     sinus  . Diabetes Brother   . Heart disease Brother   . Cancer Sister     abdominal ?  . Thyroid disease Sister   . Cancer Other     GE junction adenocarcinoma  . Emphysema Father   . Heart disease Father   . Stroke Father   . COPD Sister   . Diabetes Brother    Social History  Substance Use Topics  . Smoking status: Former Smoker -- 1.50 packs/day for 20 years    Types: Cigarettes    Quit date: 10/03/1997  . Smokeless tobacco: Never Used  . Alcohol Use: No   OB History    No data available     Review of Systems  10 Systems reviewed and all are negative for acute change except as noted in the HPI.  Allergies  Toradol; Demerol; Esgic; and Iodine  Home Medications   Prior  to Admission medications   Medication Sig Start Date End Date Taking? Authorizing Provider  ALPRAZolam Stacy Dalton) 0.5 MG tablet Take 1 tablet (0.5 mg total) by mouth daily as needed for anxiety. 09/20/15   Stacy Balloon, FNP  amoxicillin-clavulanate (AUGMENTIN) 875-125 MG tablet Take 1 tablet by mouth 2 (two) times daily. 11/18/15   Stacy Balloon, FNP  atorvastatin (LIPITOR) 40 MG tablet Take 0.5 tablets (20 mg total) by mouth daily. 06/16/15   [name], PHARMD  AZOR 10-40 MG tablet TAKE 1 TABLET DAILY 09/16/15   Stacy Balloon, FNP  buPROPion (WELLBUTRIN XL) 150 MG 24 hr tablet Take 1 tablet (150 mg total) by mouth daily. 08/17/15   Stacy Balloon, FNP  cetirizine (ZYRTEC) 10 MG tablet Take 1 tablet (10 mg total) by mouth daily. 04/26/15   Stacy Maize, MD  COMBIVENT RESPIMAT 20-100 MCG/ACT AERS respimat  09/19/15   Historical Provider, MD  docusate sodium (COLACE) 100 MG capsule Take 100 mg by mouth at bedtime.    Historical Provider, MD  DULoxetine (CYMBALTA) 60 MG capsule Take 1 capsule (60 mg total) by mouth daily. 04/24/14   Stacy Penner, FNP  fluticasone (FLONASE) 50 MCG/ACT nasal spray Place 2 sprays into both nostrils daily. 04/09/15   Stacy Kaufmann Dettinger, MD  Fluticasone-Salmeterol (ADVAIR DISKUS) 100-50 MCG/DOSE AEPB Inhale 1 puff into the lungs 2 (two) times daily. 11/18/15   Stacy Balloon, FNP  HYDROcodone-homatropine (HYCODAN) 5-1.5 MG/5ML syrup Take 5 mLs by mouth every 8 (eight) hours as needed for cough. 11/18/15   Stacy Balloon, FNP  levothyroxine (SYNTHROID, LEVOTHROID) 50 MCG tablet TAKE 1 TABLET DAILY 06/07/15   Stacy Balloon, FNP  ondansetron (ZOFRAN) 4 MG tablet Take 1 tablet (4 mg total) by mouth every 8 (eight) hours as needed for nausea or vomiting. 11/18/15   Stacy Balloon, FNP  traMADol (ULTRAM) 50 MG tablet Take 1 tablet (50 mg total) by mouth every 6 (six) hours as needed. 11/18/15   Stacy Balloon, FNP  triamterene-hydrochlorothiazide (DYAZIDE) 37.5-25 MG capsule TAKE 1 CAPSULE DAILY 11/02/15   Stacy Balloon, FNP  Vitamin D, Ergocalciferol, (DRISDOL) 50000 units CAPS capsule TAKE 1 CAPSULE EVERY 7 DAYS 11/08/15   Stacy Kaufmann Dettinger, MD  zolpidem (AMBIEN CR) 12.5 MG CR tablet TAKE 1 TABLET DAILY AT BEDTIME AS NEEDED 08/17/15   Stacy A Hawks, FNP   BP 135/80 mmHg  Pulse 97  Temp(Src) 98.7 F (37.1 C) (Oral)  Resp 16  SpO2 98% Physical Exam  Constitutional: She is oriented to person, place, and time. She appears well-developed and well-nourished. No distress.  HENT:  Head: Normocephalic and atraumatic.  Nose: Nose normal.  Mouth/Throat: Oropharynx is clear and moist. No oropharyngeal exudate.  Eyes: Conjunctivae and EOM are normal. Pupils are equal, round, and reactive to light. No scleral icterus.   Neck: Normal range of motion. Neck supple. No JVD present. No tracheal deviation present. No thyromegaly present.  Cardiovascular: Normal rate, regular rhythm and normal heart sounds.  Exam reveals no gallop and no friction rub.   No murmur heard. Pulmonary/Chest: Effort normal and breath sounds normal. No respiratory distress. She has no wheezes. She exhibits no tenderness.  Abdominal: Soft. Bowel sounds are normal. She exhibits no distension and no mass. There is generalized tenderness. There is no rebound and no guarding.  Musculoskeletal: Normal range of motion. She exhibits no edema or tenderness.  Lymphadenopathy:    She has no cervical adenopathy.  Neurological: She is alert and oriented to person, place, and time. No cranial nerve deficit. She exhibits normal muscle tone.  Skin: Skin is warm and dry. No rash noted. No erythema. No pallor.  Nursing note and vitals reviewed.   ED Course  Procedures (including critical care time) DIAGNOSTIC STUDIES: Oxygen Saturation is 98% on RA,  normal by my interpretation.    COORDINATION OF CARE: 1:06 AM Discussed treatment plan which includes lab work, CT A/P with contrast, IVF,  pain medication, and antiemetic medication with pt at bedside and pt agreed to plan.  Labs Review Labs Reviewed  COMPREHENSIVE METABOLIC PANEL - Abnormal; Notable for the following:    Potassium 3.2 (*)    Chloride 95 (*)    Glucose, Bld 116 (*)    GFR calc non Af Amer 58 (*)    All other components within normal limits  CBC - Abnormal; Notable for the following:    WBC 16.1 (*)    All other components within normal limits  URINALYSIS, ROUTINE W REFLEX MICROSCOPIC (NOT AT [facility]) - Abnormal; Notable for the following:    Ketones, ur 15 (*)    Leukocytes, UA SMALL (*)    All other components within normal limits  URINE MICROSCOPIC-ADD ON - Abnormal; Notable for the following:    Squamous Epithelial / LPF 0-5 (*)    Bacteria, UA RARE (*)    All other  components within normal limits  LIPASE, BLOOD    Imaging Review Ct Abdomen Pelvis Wo Contrast  11/25/2015  CLINICAL DATA:  Generalized diffuse abdominal pain for 3 days. EXAM: CT ABDOMEN AND PELVIS WITHOUT CONTRAST TECHNIQUE: Multidetector CT imaging of the abdomen and pelvis was performed following the standard protocol without IV contrast. COMPARISON:  11/10/2011 FINDINGS: Lower chest:  The included lung bases are clear. Liver: No focal abnormality allowing for lack of contrast. Hepatobiliary: Gallbladder physiologically distended, no calcified stone. No biliary dilatation. Pancreas: No abnormality on noncontrast exam. Spleen: Normal. Adrenal glands: No nodule. Kidneys: Bilateral renal collecting system prominence likely secondary to distended bladder. No perinephric stranding. No urolithiasis. Stomach/Bowel: Stomach is decompressed. There are no dilated or thickened small bowel loops. Large stool burden throughout the entire colon. There is colonic wall thickening at the junction of the descending and sigmoid colon in the region of and just distal to enteric chain sutures. Associated pericolonic edema. The appendix is normal. Vascular/Lymphatic: No retroperitoneal adenopathy. Abdominal aorta is normal in caliber. Atherosclerosis of the abdominal aorta and its branches, no aneurysm. Reproductive: Uterus is surgically absent. The ovaries remain in situ. Bladder: Distended.  No wall thickening. Other: No free air, free fluid, or intra-abdominal fluid collection. Musculoskeletal: There are no acute or suspicious osseous abnormalities. IMPRESSION: 1. Large diffuse stool burden throughout the entire colon. There is colonic wall thickening and pericolonic edema at the junction of the descending and sigmoid colon at site of enteric chain sutures. This may be colitis of infectious or stercoral etiology. There is no bowel obstruction. 2. Prominence of both renal collecting systems is likely secondary to bladder  distention. Electronically Signed   By: Jeb Levering M.D.   On: 11/25/2015 03:01   I have personally reviewed and evaluated these images and lab results as part of my medical decision-making.   EKG Interpretation None      MDM   Final diagnoses:  None     Patient presents to the ED for abdominal pain.  She has had diarrhea and nausea and is concerned for  SBO.  Unlikely given her diarrhea, may be diverticulitis.  Her temp is 37.9 and her WBC is high.  Will obtain CT scan for evaluation.  She was given morphine, zofran, and IVF.   CT reveals colitis.  She was given cipro/flagyl in the ED and will be sent home with Rx for 1 week.  PCP fu advised on Monday.  She appears well and in NAD. VS remain within her normal limits and she is safe for DC.  I personally performed the services described in this documentation, which was scribed in my presence. The recorded information has been reviewed and is accurate.     Stacy Balls, MD 11/25/15 [phone]

## 2015-11-26 ENCOUNTER — Encounter: Payer: Self-pay | Admitting: Family

## 2015-11-26 ENCOUNTER — Ambulatory Visit (INDEPENDENT_AMBULATORY_CARE_PROVIDER_SITE_OTHER): Payer: Medicare Other | Admitting: Family

## 2015-11-26 VITALS — BP 114/67 | HR 88 | Temp 97.5°F | Ht 61.0 in | Wt 121.0 lb

## 2015-11-26 DIAGNOSIS — Z09 Encounter for follow-up examination after completed treatment for conditions other than malignant neoplasm: Secondary | ICD-10-CM

## 2015-11-26 DIAGNOSIS — K529 Noninfective gastroenteritis and colitis, unspecified: Secondary | ICD-10-CM | POA: Diagnosis not present

## 2015-11-26 MED ORDER — HYDROCODONE-ACETAMINOPHEN 5-325 MG PO TABS: 1.0000 | ORAL_TABLET | Freq: Four times a day (QID) | ORAL | Status: DC | PRN

## 2015-11-26 MED ORDER — ONDANSETRON HCL 4 MG PO TABS: 4.0000 mg | ORAL_TABLET | Freq: Three times a day (TID) | ORAL | Status: DC | PRN

## 2015-11-26 NOTE — Patient Instructions (Signed)
°  Colitis °Colitis is inflammation of the colon. Colitis may last a short time (acute) or it may last a long time (chronic). °CAUSES °This condition may be caused by: °· Viruses. °· Bacteria. °· Reactions to medicine. °· Certain autoimmune diseases, such as Crohn disease or ulcerative colitis. °SYMPTOMS °Symptoms of this condition include: °· Diarrhea. °· Passing bloody or tarry stool. °· Pain. °· Fever. °· Vomiting. °· Tiredness (fatigue). °· Weight loss. °· Bloating. °· Sudden increase in abdominal pain. °· Having fewer bowel movements than usual. °DIAGNOSIS °This condition is diagnosed with a stool test or a blood test. You may also have other tests, including X-rays, a CT scan, or a colonoscopy. °TREATMENT °Treatment may include: °· Resting the bowel. This involves not eating or drinking for a period of time. °· Fluids that are given through an IV tube. °· Medicine for pain and diarrhea. °· Antibiotic medicines. °· Cortisone medicines. °· Surgery. °HOME CARE INSTRUCTIONS °Eating and Drinking °· Follow instructions from your health care provider about eating or drinking restrictions. °· Drink enough fluid to keep your urine clear or pale yellow. °· Work with a dietitian to determine which foods cause your condition to flare up. °· Avoid foods that cause flare-ups. °· Eat a well-balanced diet. °Medicines °· Take over-the-counter and prescription medicines only as told by your health care provider. °· If you were prescribed an antibiotic medicine, take it as told by your health care provider. Do not stop taking the antibiotic even if you start to feel better. °General Instructions °· Keep all follow-up visits as told by your health care provider. This is important. °SEEK MEDICAL CARE IF: °· Your symptoms do not go away. °· You develop new symptoms. °SEEK IMMEDIATE MEDICAL CARE IF: °· You have a fever that does not go away with treatment. °· You develop chills. °· You have extreme weakness, fainting, or  dehydration. °· You have repeated vomiting. °· You develop severe pain in your abdomen. °· You pass bloody or tarry stool. °  °This information is not intended to replace advice given to you by your health care provider. Make sure you discuss any questions you have with your health care provider. °  °Document Released: 07/27/2004 Document Revised: 03/10/2015 Document Reviewed: 10/12/2014 °Elsevier Interactive Patient Education ©2016 [employer] ° °

## 2015-11-26 NOTE — Progress Notes (Signed)
   Subjective:    Patient ID: Stacy Dalton, female    DOB: Jun 24, 1951, 65 y.o.   MRN: [id number]  HPI Pt presents to the office today for hospital follow up. Pt went to ED on 11/25/15 for abdominal pain and was diagnosed with colitis. PT was started on Cipro/Flagyl. Pt reports she continues to have lower abd pain that is worse in the LLQ pain with diarrhea, nausea, and having aching pain of 9 out 10. PT states her ultram is not helping her pain at this time.   *Hosptial notes reviewed  Review of Systems  Gastrointestinal: Positive for nausea, diarrhea and blood in stool.  All other systems reviewed and are negative.      Objective:   Physical Exam  Constitutional: She is oriented to person, place, and time. She appears well-developed and well-nourished. No distress.  Eyes: Pupils are equal, round, and reactive to light.  Neck: Normal range of motion. Neck supple. No thyromegaly present.  Cardiovascular: Normal rate, regular rhythm, normal heart sounds and intact distal pulses.   No murmur heard. Pulmonary/Chest: Effort normal and breath sounds normal. No respiratory distress. She has no wheezes.  Abdominal: Soft. Bowel sounds are normal. She exhibits no distension. There is tenderness (LLQ pain). There is guarding.  Musculoskeletal: Normal range of motion. She exhibits no edema or tenderness.  Neurological: She is alert and oriented to person, place, and time.  Skin: Skin is warm and dry.  Psychiatric: She has a normal mood and affect. Her behavior is normal. Judgment and thought content normal.  Vitals reviewed.     BP 114/67 mmHg  Pulse 88  Temp(Src) 97.5 F (36.4 C) (Oral)  Ht 5\' 1"  (1.549 m)  Wt 121 lb (54.885 kg)  BMI 22.87 kg/m2     Assessment & Plan:  1. Colitis, acute _Continue Cipro.Flaygl  -High fiber diet -Norco rx given today- Pt to hold Ultram and take Norco for abd pain and then return to ultram once abd pain is resolved - HYDROcodone-acetaminophen  (NORCO/VICODIN) 5-325 MG tablet; Take 1 tablet by mouth every 6 (six) hours as needed for moderate pain.  Dispense: 30 tablet; Refill: 0  2. Hospital discharge follow-up    Evelina Dun, FNP

## 2015-11-26 NOTE — Addendum Note (Signed)
Addended by: Evelina Dun A on: 11/26/2015 02:39 PM   Modules accepted: Orders

## 2015-11-29 ENCOUNTER — Encounter (HOSPITAL_COMMUNITY): Admission: EM | Disposition: A | Discharge status: Skilled Nursing Facility | Payer: Self-pay | Source: Home / Self Care

## 2015-11-29 ENCOUNTER — Encounter (HOSPITAL_COMMUNITY): Payer: Self-pay | Admitting: Emergency Medicine

## 2015-11-29 ENCOUNTER — Emergency Department (HOSPITAL_COMMUNITY): Payer: Medicare Other | Admitting: Anesthesiology

## 2015-11-29 ENCOUNTER — Emergency Department (HOSPITAL_COMMUNITY): Payer: Medicare Other

## 2015-11-29 ENCOUNTER — Inpatient Hospital Stay (HOSPITAL_COMMUNITY)
Admission: EM | Admit: 2015-11-29 | Discharge: 2015-12-04 | DRG: 329 | Disposition: A | Discharge status: Skilled Nursing Facility | Payer: Medicare Other | Attending: Surgery | Admitting: Surgery

## 2015-11-29 DIAGNOSIS — G8929 Other chronic pain: Secondary | ICD-10-CM | POA: Diagnosis present

## 2015-11-29 DIAGNOSIS — K567 Ileus, unspecified: Secondary | ICD-10-CM | POA: Diagnosis not present

## 2015-11-29 DIAGNOSIS — K219 Gastro-esophageal reflux disease without esophagitis: Secondary | ICD-10-CM | POA: Diagnosis present

## 2015-11-29 DIAGNOSIS — M8580 Other specified disorders of bone density and structure, unspecified site: Secondary | ICD-10-CM | POA: Diagnosis not present

## 2015-11-29 DIAGNOSIS — K6389 Other specified diseases of intestine: Secondary | ICD-10-CM | POA: Diagnosis not present

## 2015-11-29 DIAGNOSIS — K9400 Colostomy complication, unspecified: Secondary | ICD-10-CM | POA: Diagnosis not present

## 2015-11-29 DIAGNOSIS — Z79891 Long term (current) use of opiate analgesic: Secondary | ICD-10-CM

## 2015-11-29 DIAGNOSIS — K659 Peritonitis, unspecified: Secondary | ICD-10-CM | POA: Diagnosis present

## 2015-11-29 DIAGNOSIS — Z79899 Other long term (current) drug therapy: Secondary | ICD-10-CM

## 2015-11-29 DIAGNOSIS — E039 Hypothyroidism, unspecified: Secondary | ICD-10-CM | POA: Diagnosis present

## 2015-11-29 DIAGNOSIS — J449 Chronic obstructive pulmonary disease, unspecified: Secondary | ICD-10-CM | POA: Diagnosis not present

## 2015-11-29 DIAGNOSIS — I10 Essential (primary) hypertension: Secondary | ICD-10-CM | POA: Diagnosis present

## 2015-11-29 DIAGNOSIS — F329 Major depressive disorder, single episode, unspecified: Secondary | ICD-10-CM | POA: Diagnosis present

## 2015-11-29 DIAGNOSIS — K651 Peritoneal abscess: Secondary | ICD-10-CM | POA: Diagnosis present

## 2015-11-29 DIAGNOSIS — Z7951 Long term (current) use of inhaled steroids: Secondary | ICD-10-CM

## 2015-11-29 DIAGNOSIS — K5909 Other constipation: Secondary | ICD-10-CM | POA: Diagnosis present

## 2015-11-29 DIAGNOSIS — Z87891 Personal history of nicotine dependence: Secondary | ICD-10-CM | POA: Diagnosis not present

## 2015-11-29 DIAGNOSIS — R2681 Unsteadiness on feet: Secondary | ICD-10-CM | POA: Diagnosis not present

## 2015-11-29 DIAGNOSIS — Z9049 Acquired absence of other specified parts of digestive tract: Secondary | ICD-10-CM | POA: Diagnosis not present

## 2015-11-29 DIAGNOSIS — K529 Noninfective gastroenteritis and colitis, unspecified: Secondary | ICD-10-CM

## 2015-11-29 DIAGNOSIS — R110 Nausea: Secondary | ICD-10-CM | POA: Diagnosis not present

## 2015-11-29 DIAGNOSIS — F419 Anxiety disorder, unspecified: Secondary | ICD-10-CM | POA: Diagnosis present

## 2015-11-29 DIAGNOSIS — M549 Dorsalgia, unspecified: Secondary | ICD-10-CM | POA: Diagnosis present

## 2015-11-29 DIAGNOSIS — K630 Abscess of intestine: Secondary | ICD-10-CM | POA: Diagnosis present

## 2015-11-29 DIAGNOSIS — K2970 Gastritis, unspecified, without bleeding: Secondary | ICD-10-CM | POA: Diagnosis not present

## 2015-11-29 DIAGNOSIS — K5641 Fecal impaction: Secondary | ICD-10-CM | POA: Diagnosis not present

## 2015-11-29 DIAGNOSIS — F411 Generalized anxiety disorder: Secondary | ICD-10-CM | POA: Diagnosis not present

## 2015-11-29 DIAGNOSIS — E860 Dehydration: Secondary | ICD-10-CM | POA: Diagnosis present

## 2015-11-29 DIAGNOSIS — D72829 Elevated white blood cell count, unspecified: Secondary | ICD-10-CM | POA: Diagnosis not present

## 2015-11-29 DIAGNOSIS — K631 Perforation of intestine (nontraumatic): Secondary | ICD-10-CM | POA: Diagnosis not present

## 2015-11-29 DIAGNOSIS — R1311 Dysphagia, oral phase: Secondary | ICD-10-CM | POA: Diagnosis not present

## 2015-11-29 DIAGNOSIS — R41841 Cognitive communication deficit: Secondary | ICD-10-CM | POA: Diagnosis not present

## 2015-11-29 DIAGNOSIS — M6281 Muscle weakness (generalized): Secondary | ICD-10-CM | POA: Diagnosis not present

## 2015-11-29 DIAGNOSIS — R1030 Lower abdominal pain, unspecified: Secondary | ICD-10-CM | POA: Diagnosis not present

## 2015-11-29 DIAGNOSIS — G43909 Migraine, unspecified, not intractable, without status migrainosus: Secondary | ICD-10-CM | POA: Diagnosis not present

## 2015-11-29 HISTORY — PX: LAPAROTOMY: SHX154

## 2015-11-29 LAB — BASIC METABOLIC PANEL
Anion gap: 9 (ref 5–15)
BUN: 15 mg/dL (ref 6–20)
CO2: 23 mmol/L (ref 22–32)
Calcium: 7.3 mg/dL — ABNORMAL LOW (ref 8.9–10.3)
Chloride: 102 mmol/L (ref 101–111)
Creatinine, Ser: 1.07 mg/dL — ABNORMAL HIGH (ref 0.44–1.00)
GFR calc Af Amer: >60 mL/min (ref >60)
GFR calc non Af Amer: 52 mL/min — ABNORMAL LOW (ref >60)
Glucose, Bld: 152 mg/dL — ABNORMAL HIGH (ref 65–99)
Potassium: 3.2 mmol/L — ABNORMAL LOW (ref 3.5–5.1)
Sodium: 134 mmol/L — ABNORMAL LOW (ref 135–145)

## 2015-11-29 LAB — COMPREHENSIVE METABOLIC PANEL
ALT: 108 U/L — ABNORMAL HIGH (ref 14–54)
AST: 85 U/L — ABNORMAL HIGH (ref 15–41)
Albumin: 3.2 g/dL — ABNORMAL LOW (ref 3.5–5.0)
Alkaline Phosphatase: 88 U/L (ref 38–126)
Anion gap: 9 (ref 5–15)
BUN: 23 mg/dL — ABNORMAL HIGH (ref 6–20)
CO2: 29 mmol/L (ref 22–32)
Calcium: 8.4 mg/dL — ABNORMAL LOW (ref 8.9–10.3)
Chloride: 93 mmol/L — ABNORMAL LOW (ref 101–111)
Creatinine, Ser: 1.55 mg/dL — ABNORMAL HIGH (ref 0.44–1.00)
GFR calc Af Amer: 39 mL/min — ABNORMAL LOW (ref >60)
GFR calc non Af Amer: 34 mL/min — ABNORMAL LOW (ref >60)
Glucose, Bld: 155 mg/dL — ABNORMAL HIGH (ref 65–99)
Potassium: 3.4 mmol/L — ABNORMAL LOW (ref 3.5–5.1)
Sodium: 131 mmol/L — ABNORMAL LOW (ref 135–145)
Total Bilirubin: 0.4 mg/dL (ref 0.3–1.2)
Total Protein: 6.3 g/dL — ABNORMAL LOW (ref 6.5–8.1)

## 2015-11-29 LAB — URINALYSIS, ROUTINE W REFLEX MICROSCOPIC
Bilirubin Urine: NEGATIVE
Glucose, UA: NEGATIVE mg/dL
Hgb urine dipstick: NEGATIVE
Ketones, ur: NEGATIVE mg/dL
Leukocytes, UA: NEGATIVE
Nitrite: NEGATIVE
Protein, ur: NEGATIVE mg/dL
Specific Gravity, Urine: 1.015 (ref 1.005–1.030)
pH: 5.5 (ref 5.0–8.0)

## 2015-11-29 LAB — CBC
HCT: 36.9 % (ref 36.0–46.0)
Hemoglobin: 12.3 g/dL (ref 12.0–15.0)
MCH: 28.6 pg (ref 26.0–34.0)
MCHC: 33.3 g/dL (ref 30.0–36.0)
MCV: 85.8 fL (ref 78.0–100.0)
Platelets: 412 10*3/uL — ABNORMAL HIGH (ref 150–400)
RBC: 4.3 MIL/uL (ref 3.87–5.11)
RDW: 13.7 % (ref 11.5–15.5)
WBC: 15.1 10*3/uL — ABNORMAL HIGH (ref 4.0–10.5)

## 2015-11-29 LAB — CBC WITH DIFFERENTIAL/PLATELET
Basophils Absolute: 0 10*3/uL (ref 0.0–0.1)
Basophils Relative: 0 %
Eosinophils Absolute: 0.1 10*3/uL (ref 0.0–0.7)
Eosinophils Relative: 0 %
HCT: 35.6 % — ABNORMAL LOW (ref 36.0–46.0)
Hemoglobin: 12.4 g/dL (ref 12.0–15.0)
Lymphocytes Relative: 5 %
Lymphs Abs: 1.1 10*3/uL (ref 0.7–4.0)
MCH: 28.8 pg (ref 26.0–34.0)
MCHC: 34.8 g/dL (ref 30.0–36.0)
MCV: 82.8 fL (ref 78.0–100.0)
Monocytes Absolute: 1.7 10*3/uL — ABNORMAL HIGH (ref 0.1–1.0)
Monocytes Relative: 8 %
Neutro Abs: 19.7 10*3/uL — ABNORMAL HIGH (ref 1.7–7.7)
Neutrophils Relative %: 87 %
Platelets: 378 10*3/uL (ref 150–400)
RBC: 4.3 MIL/uL (ref 3.87–5.11)
RDW: 13.3 % (ref 11.5–15.5)
WBC: 22.5 10*3/uL — ABNORMAL HIGH (ref 4.0–10.5)

## 2015-11-29 LAB — MRSA PCR SCREENING: MRSA by PCR: NEGATIVE

## 2015-11-29 LAB — I-STAT CG4 LACTIC ACID, ED: Lactic Acid, Venous: 2.36 mmol/L (ref 0.5–2.0)

## 2015-11-29 SURGERY — LAPAROTOMY, EXPLORATORY
Anesthesia: General | Site: Abdomen

## 2015-11-29 MED ORDER — FENTANYL CITRATE (PF) 250 MCG/5ML IJ SOLN
INTRAMUSCULAR | Status: AC
  Filled 2015-11-29: qty 5

## 2015-11-29 MED ORDER — FENTANYL CITRATE (PF) 100 MCG/2ML IJ SOLN
INTRAMUSCULAR | Status: DC | PRN
  Administered 2015-11-29 (×3): 50 ug via INTRAVENOUS
  Administered 2015-11-29: 100 ug via INTRAVENOUS

## 2015-11-29 MED ORDER — ROCURONIUM BROMIDE 100 MG/10ML IV SOLN
INTRAVENOUS | Status: DC | PRN
  Administered 2015-11-29: 40 mg via INTRAVENOUS
  Administered 2015-11-29 (×3): 10 mg via INTRAVENOUS

## 2015-11-29 MED ORDER — SUGAMMADEX SODIUM 200 MG/2ML IV SOLN
INTRAVENOUS | Status: AC
  Filled 2015-11-29: qty 2

## 2015-11-29 MED ORDER — IPRATROPIUM-ALBUTEROL 0.5-2.5 (3) MG/3ML IN SOLN: 3.0000 mL | Freq: Four times a day (QID) | RESPIRATORY_TRACT | Status: DC | PRN

## 2015-11-29 MED ORDER — HYDROMORPHONE HCL 1 MG/ML IJ SOLN
0.2500 mg | INTRAMUSCULAR | Status: DC | PRN
  Administered 2015-11-29 (×3): 0.25 mg via INTRAVENOUS

## 2015-11-29 MED ORDER — SODIUM CHLORIDE 0.9 % IV BOLUS (SEPSIS)
1000.0000 mL | Freq: Once | INTRAVENOUS | Status: AC
  Administered 2015-11-29: 1000 mL via INTRAVENOUS

## 2015-11-29 MED ORDER — ROCURONIUM BROMIDE 100 MG/10ML IV SOLN
INTRAVENOUS | Status: AC
  Filled 2015-11-29: qty 1

## 2015-11-29 MED ORDER — DEXAMETHASONE SODIUM PHOSPHATE 10 MG/ML IJ SOLN
INTRAMUSCULAR | Status: AC
  Filled 2015-11-29: qty 1

## 2015-11-29 MED ORDER — HYDROMORPHONE HCL 1 MG/ML IJ SOLN
INTRAMUSCULAR | Status: AC
  Filled 2015-11-29: qty 1

## 2015-11-29 MED ORDER — MORPHINE SULFATE (PF) 4 MG/ML IV SOLN
4.0000 mg | Freq: Once | INTRAVENOUS | Status: AC
  Administered 2015-11-29: 4 mg via INTRAVENOUS
  Filled 2015-11-29: qty 1

## 2015-11-29 MED ORDER — OXYCODONE HCL 5 MG/5ML PO SOLN: 5.0000 mg | Freq: Once | ORAL | Status: DC | PRN

## 2015-11-29 MED ORDER — PIPERACILLIN-TAZOBACTAM 3.375 G IVPB 30 MIN
3.3750 g | Freq: Once | INTRAVENOUS | Status: AC
  Administered 2015-11-29: 3.375 g via INTRAVENOUS
  Filled 2015-11-29: qty 50

## 2015-11-29 MED ORDER — PROPOFOL 10 MG/ML IV BOLUS
INTRAVENOUS | Status: AC
  Filled 2015-11-29: qty 20

## 2015-11-29 MED ORDER — SODIUM CHLORIDE 0.9 % IV BOLUS (SEPSIS)
250.0000 mL | Freq: Once | INTRAVENOUS | Status: AC
  Administered 2015-11-29: 250 mL via INTRAVENOUS

## 2015-11-29 MED ORDER — ONDANSETRON HCL 4 MG/2ML IJ SOLN
4.0000 mg | Freq: Once | INTRAMUSCULAR | Status: AC
  Administered 2015-11-29: 4 mg via INTRAVENOUS
  Filled 2015-11-29: qty 2

## 2015-11-29 MED ORDER — KCL IN DEXTROSE-NACL 20-5-0.45 MEQ/L-%-% IV SOLN
INTRAVENOUS | Status: DC
  Administered 2015-11-29 – 2015-11-30 (×2): via INTRAVENOUS
  Filled 2015-11-29: qty 1000

## 2015-11-29 MED ORDER — DEXAMETHASONE SODIUM PHOSPHATE 10 MG/ML IJ SOLN
INTRAMUSCULAR | Status: DC | PRN
  Administered 2015-11-29: 10 mg via INTRAVENOUS

## 2015-11-29 MED ORDER — HEPARIN SODIUM (PORCINE) 5000 UNIT/ML IJ SOLN
5000.0000 [IU] | Freq: Three times a day (TID) | INTRAMUSCULAR | Status: DC
  Administered 2015-11-30 – 2015-12-04 (×13): 5000 [IU] via SUBCUTANEOUS
  Filled 2015-11-29 (×15): qty 1

## 2015-11-29 MED ORDER — HYDROMORPHONE HCL 1 MG/ML IJ SOLN
1.0000 mg | INTRAMUSCULAR | Status: DC | PRN
Start: 2015-11-29 — End: 2015-11-30
  Administered 2015-11-29 – 2015-11-30 (×4): 1 mg via INTRAVENOUS
  Filled 2015-11-29 (×4): qty 1

## 2015-11-29 MED ORDER — SUGAMMADEX SODIUM 200 MG/2ML IV SOLN
INTRAVENOUS | Status: DC | PRN
  Administered 2015-11-29: 200 mg via INTRAVENOUS

## 2015-11-29 MED ORDER — PIPERACILLIN-TAZOBACTAM 3.375 G IVPB
3.3750 g | Freq: Three times a day (TID) | INTRAVENOUS | Status: DC
  Administered 2015-11-29 – 2015-12-04 (×14): 3.375 g via INTRAVENOUS
  Filled 2015-11-29 (×15): qty 50

## 2015-11-29 MED ORDER — LIDOCAINE HCL (CARDIAC) 20 MG/ML IV SOLN
INTRAVENOUS | Status: AC
  Filled 2015-11-29: qty 5

## 2015-11-29 MED ORDER — ONDANSETRON HCL 4 MG/2ML IJ SOLN
INTRAMUSCULAR | Status: AC
  Filled 2015-11-29: qty 2

## 2015-11-29 MED ORDER — 0.9 % SODIUM CHLORIDE (POUR BTL) OPTIME
TOPICAL | Status: DC | PRN
  Administered 2015-11-29 (×6): 1000 mL

## 2015-11-29 MED ORDER — HYDROMORPHONE HCL 1 MG/ML IJ SOLN
0.5000 mg | Freq: Once | INTRAMUSCULAR | Status: AC
Start: 2015-11-29 — End: 2015-11-29
  Administered 2015-11-29: 0.5 mg via INTRAVENOUS
  Filled 2015-11-29: qty 1

## 2015-11-29 MED ORDER — ONDANSETRON HCL 4 MG/2ML IJ SOLN
4.0000 mg | Freq: Four times a day (QID) | INTRAMUSCULAR | Status: DC | PRN
  Administered 2015-11-29 – 2015-12-04 (×9): 4 mg via INTRAVENOUS
  Filled 2015-11-29 (×9): qty 2

## 2015-11-29 MED ORDER — MIDAZOLAM HCL 2 MG/2ML IJ SOLN
INTRAMUSCULAR | Status: AC
  Filled 2015-11-29: qty 2

## 2015-11-29 MED ORDER — OXYCODONE HCL 5 MG PO TABS: 5.0000 mg | ORAL_TABLET | Freq: Once | ORAL | Status: DC | PRN

## 2015-11-29 MED ORDER — PROMETHAZINE HCL 25 MG/ML IJ SOLN
6.2500 mg | INTRAMUSCULAR | Status: DC | PRN
  Administered 2015-11-29: 6.25 mg via INTRAVENOUS
  Filled 2015-11-29: qty 1

## 2015-11-29 MED ORDER — LIDOCAINE HCL (CARDIAC) 20 MG/ML IV SOLN
INTRAVENOUS | Status: DC | PRN
  Administered 2015-11-29: 70 mg via INTRAVENOUS

## 2015-11-29 MED ORDER — IPRATROPIUM-ALBUTEROL 20-100 MCG/ACT IN AERS: 1.0000 | INHALATION_SPRAY | Freq: Four times a day (QID) | RESPIRATORY_TRACT | Status: DC | PRN

## 2015-11-29 MED ORDER — ONDANSETRON HCL 4 MG PO TABS
4.0000 mg | ORAL_TABLET | Freq: Four times a day (QID) | ORAL | Status: DC | PRN
  Administered 2015-12-04: 4 mg via ORAL
  Filled 2015-11-29: qty 1

## 2015-11-29 MED ORDER — PROPOFOL 10 MG/ML IV BOLUS
INTRAVENOUS | Status: DC | PRN
  Administered 2015-11-29: 150 mg via INTRAVENOUS

## 2015-11-29 MED ORDER — MOMETASONE FURO-FORMOTEROL FUM 100-5 MCG/ACT IN AERO
2.0000 | INHALATION_SPRAY | Freq: Two times a day (BID) | RESPIRATORY_TRACT | Status: DC
  Administered 2015-11-30 – 2015-12-03 (×7): 2 via RESPIRATORY_TRACT
  Filled 2015-11-29: qty 8.8

## 2015-11-29 MED ORDER — SODIUM CHLORIDE 0.9 % IV BOLUS (SEPSIS)
500.0000 mL | Freq: Once | INTRAVENOUS | Status: AC
  Administered 2015-11-29: 500 mL via INTRAVENOUS

## 2015-11-29 MED ORDER — LACTATED RINGERS IV SOLN
INTRAVENOUS | Status: DC | PRN
  Administered 2015-11-29 (×3): via INTRAVENOUS

## 2015-11-29 MED ORDER — PROMETHAZINE HCL 25 MG/ML IJ SOLN
INTRAMUSCULAR | Status: AC
  Filled 2015-11-29: qty 1

## 2015-11-29 MED ORDER — ONDANSETRON HCL 4 MG/2ML IJ SOLN
INTRAMUSCULAR | Status: DC | PRN
  Administered 2015-11-29: 4 mg via INTRAVENOUS

## 2015-11-29 MED ORDER — DIATRIZOATE MEGLUMINE & SODIUM 66-10 % PO SOLN: 15.0000 mL | Freq: Once | ORAL | Status: DC

## 2015-11-29 MED ORDER — LACTATED RINGERS IV SOLN: INTRAVENOUS | Status: DC

## 2015-11-29 MED ORDER — KCL IN DEXTROSE-NACL 20-5-0.45 MEQ/L-%-% IV SOLN
INTRAVENOUS | Status: AC
Start: 2015-11-29 — End: 2015-11-29
  Filled 2015-11-29: qty 1000

## 2015-11-29 MED ORDER — PIPERACILLIN-TAZOBACTAM 3.375 G IVPB: 3.3750 g | Freq: Three times a day (TID) | INTRAVENOUS | Status: DC

## 2015-11-29 MED ORDER — SUCCINYLCHOLINE CHLORIDE 20 MG/ML IJ SOLN
INTRAMUSCULAR | Status: DC | PRN
  Administered 2015-11-29: 100 mg via INTRAVENOUS

## 2015-11-29 SURGICAL SUPPLY — 49 items
APPLICATOR COTTON TIP 6IN STRL (MISCELLANEOUS) ×1 IMPLANT
BLADE EXTENDED COATED 6.5IN (ELECTRODE) IMPLANT
BLADE HEX COATED 2.75 (ELECTRODE) ×1 IMPLANT
COVER MAYO STAND STRL (DRAPES) ×2 IMPLANT
COVER SURGICAL LIGHT HANDLE (MISCELLANEOUS) ×1 IMPLANT
CUTTER FLEX LINEAR 45M (STAPLE) ×1 IMPLANT
DRAIN CHANNEL 19F RND (DRAIN) IMPLANT
DRAPE LAPAROSCOPIC ABDOMINAL (DRAPES) ×2 IMPLANT
DRAPE WARM FLUID 44X44 (DRAPE) ×2 IMPLANT
ELECT REM PT RETURN 9FT ADLT (ELECTROSURGICAL) ×2
ELECTRODE REM PT RTRN 9FT ADLT (ELECTROSURGICAL) ×1 IMPLANT
EVACUATOR SILICONE 100CC (DRAIN) IMPLANT
GAUZE SPONGE 4X4 12PLY STRL (GAUZE/BANDAGES/DRESSINGS) ×2 IMPLANT
GLOVE BIO SURGEON STRL SZ7.5 (GLOVE) ×1 IMPLANT
GLOVE BIOGEL M 8.0 STRL (GLOVE) ×3 IMPLANT
GLOVE BIOGEL PI IND STRL 7.5 (GLOVE) IMPLANT
GLOVE BIOGEL PI INDICATOR 7.5 (GLOVE) ×1
GOWN SPEC L4 XLG W/TWL (GOWN DISPOSABLE) ×1 IMPLANT
GOWN STRL REUS W/TWL XL LVL3 (GOWN DISPOSABLE) ×5 IMPLANT
HANDLE SUCTION POOLE (INSTRUMENTS) IMPLANT
KIT BASIN OR (CUSTOM PROCEDURE TRAY) ×2 IMPLANT
LIGASURE IMPACT 36 18CM CVD LR (INSTRUMENTS) ×1 IMPLANT
NS IRRIG 1000ML POUR BTL (IV SOLUTION) ×4 IMPLANT
PACK GENERAL/GYN (CUSTOM PROCEDURE TRAY) ×2 IMPLANT
RELOAD 45 VASCULAR/THIN (ENDOMECHANICALS) ×2 IMPLANT
RELOAD PROXIMATE 75MM BLUE (ENDOMECHANICALS) ×2 IMPLANT
RELOAD STAPLE 45 2.5 WHT GRN (ENDOMECHANICALS) IMPLANT
RELOAD STAPLE 75 3.8 BLU REG (ENDOMECHANICALS) IMPLANT
SPONGE LAP 18X18 X RAY DECT (DISPOSABLE) ×4 IMPLANT
STAPLER PROXIMATE 75MM BLUE (STAPLE) ×1 IMPLANT
STAPLER VISISTAT 35W (STAPLE) ×2 IMPLANT
SUCTION POOLE HANDLE (INSTRUMENTS) ×2
SUT CHROMIC 3 0 SH 27 (SUTURE) ×3 IMPLANT
SUT PDS AB 1 CTX 36 (SUTURE) IMPLANT
SUT PDS AB 1 TP1 96 (SUTURE) ×2 IMPLANT
SUT PROLENE 2 0 BLUE (SUTURE) ×1 IMPLANT
SUT SILK 2 0 (SUTURE) ×2
SUT SILK 2 0 SH CR/8 (SUTURE) ×2 IMPLANT
SUT SILK 2-0 18XBRD TIE 12 (SUTURE) ×1 IMPLANT
SUT SILK 3 0 (SUTURE) ×2
SUT SILK 3 0 SH CR/8 (SUTURE) ×2 IMPLANT
SUT SILK 3-0 18XBRD TIE 12 (SUTURE) ×1 IMPLANT
SUT VIC AB 3-0 SH 18 (SUTURE) IMPLANT
SUT VICRYL 2 0 18  UND BR (SUTURE)
SUT VICRYL 2 0 18 UND BR (SUTURE) IMPLANT
TOWEL OR 17X26 10 PK STRL BLUE (TOWEL DISPOSABLE) ×3 IMPLANT
TOWEL OR NON WOVEN STRL DISP B (DISPOSABLE) ×2 IMPLANT
TRAY FOLEY W/METER SILVER 14FR (SET/KITS/TRAYS/PACK) ×2 IMPLANT
TRAY FOLEY W/METER SILVER 16FR (SET/KITS/TRAYS/PACK) ×1 IMPLANT

## 2015-11-29 NOTE — Anesthesia Preprocedure Evaluation (Addendum)
Anesthesia Evaluation  Patient identified by MRN, date of birth, ID band Patient awake    Reviewed: Allergy & Precautions, NPO status , Patient's Chart, lab work & pertinent test results  Airway Mallampati: I  TM Distance: >3 FB Neck ROM: Full    Dental  (+) Teeth Intact, Dental Advisory Given   Pulmonary COPD,  COPD inhaler, former smoker,    breath sounds clear to auscultation       Cardiovascular hypertension, Pt. on medications  Rhythm:Regular Rate:Normal     Neuro/Psych    GI/Hepatic GERD  Medicated and Controlled,  Endo/Other  Hypothyroidism   Renal/GU      Musculoskeletal   Abdominal   Peds  Hematology   Anesthesia Other Findings   Reproductive/Obstetrics                            Anesthesia Physical Anesthesia Plan  ASA: III and emergent  Anesthesia Plan: General   Post-op Pain Management:    Induction: Intravenous  Airway Management Planned: Oral ETT  Additional Equipment:   Intra-op Plan:   Post-operative Plan: Extubation in OR  Informed Consent: I have reviewed the patients History and Physical, chart, labs and discussed the procedure including the risks, benefits and alternatives for the proposed anesthesia with the patient or authorized representative who has indicated his/her understanding and acceptance.   Dental advisory given  Plan Discussed with: CRNA, Anesthesiologist and Surgeon  Anesthesia Plan Comments:         Anesthesia Quick Evaluation

## 2015-11-29 NOTE — Brief Op Note (Signed)
11/29/2015  6:51 PM  PATIENT:  Stacy Dalton  65 y.o. female  PRE-OPERATIVE DIAGNOSIS:  free air  POST-OPERATIVE DIAGNOSIS:  total colonic impaction; splenic flexture rupture  PROCEDURE:  Procedure(s): EXPLORATORY LAPAROTOMY; SUBTOTAL COLECTOMY WITH HARTMAN PROCEDURE AND END COLOSTOMY (N/A)  SURGEON:  Surgeon(s) and Role:    * Johnathan Hausen, MD - Primary  PHYSICIAN ASSISTANT:   ASSISTANTS: none   ANESTHESIA:   general  EBL:  Total I/O In: 1000 [I.V.:1000] Out: 500 [Urine:400; Blood:100]  BLOOD ADMINISTERED:none  DRAINS: none   LOCAL MEDICATIONS USED:  NONE  SPECIMEN:  Source of Specimen:  colon  DISPOSITION OF SPECIMEN:  PATHOLOGY  COUNTS:  YES  TOURNIQUET:  * No tourniquets in log *  DICTATION: .Other Dictation: Dictation Number [id number]  PLAN OF CARE: Admit to inpatient   PATIENT DISPOSITION:  PACU - hemodynamically stable.   Delay start of Pharmacological VTE agent (>24hrs) due to surgical blood loss or risk of bleeding: yes

## 2015-11-29 NOTE — ED Notes (Signed)
Patient belongings sent with family.

## 2015-11-29 NOTE — ED Notes (Signed)
Per EMS patient comes from home for abd pain, nausea and fever of 100.9.  Patient was treated at [facility] on 5/25 for colitis and saw her PCP on 5/26.

## 2015-11-29 NOTE — Op Note (Signed)
NAMEMarland Kitchen  Stacy Dalton, Stacy Dalton NO.:  [id number]  MEDICAL RECORD NO.:  [id number]  LOCATION:  [id number]                         FACILITY:  [facility]  PHYSICIAN:  Stacy Caprice. 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  POSTOPERATIVE DIAGNOSIS:  Stercoral perforation of the splenic flexure with a 10-cm defect in the colon.  PROCEDURE:  Subtotal colectomy, Hartmann procedure with end ascending colostomy.  SURGEON:  Stacy Caprice. Hassell Done, MD  ASSISTANT:  None.  ANESTHESIA:  General endotracheal.  DESCRIPTION OF PROCEDURE:  Stacy Dalton is a 65 year old lady who underwent a resection for stercoral perforation in 2013 by Dr. Dalbert Batman. Two days ago, she presented to the emergency room with pain and on CT, was seen to have colitis.  She was sent home and came back today with colitis and free air.  She was seen in the emergency room, where informed consent was obtained about the probability of a colostomy.  The patient was taken to the OR on [facility], in the afternoon in Room #1 at [facility], given general anesthesia.  Preoperatively, she received Zosyn.  The abdomen was prepped with Betadine and draped sterilely.  A Foley catheter had been inserted.  The old incision was excised and the abdomen was entered without difficulty.  There were some adhesions that I took down.  I went over and mobilized the left lower quadrant and found the sigmoid and left colon to be massively distended with stool.  I carried this dissection up to the anastomosis, which was intact.  Transverse colon likewise was all the way impacted with stool. Having reviewed the CT scan, I then began mobilizing and as I went up to the splenic flexure, found the walled off perforation where there was pus and there was just hard masses of stool that I  then manually removed from the table.  I then mobilized the colon with the aid of the LigaSure.  I elected to divide the colon distally after milking some stool back probably in the midsigmoid colon.  This was Dalton with a GIA and I subsequently oversewed this with two 2-0 Prolenes for identification.  I went through the mesentery staying fairly close to the bowel with a LigaSure and resected the remnant of the sigmoid colon, left colon, splenic flexure, transverse colon, hepatic flexure, and down to the ascending cecal region.  I left a little area of the right colon for hopefully fluid and electrolyte absorption rather than giving her an ileostomy.  This was brought out through separate incision on the right side and was level out and stay in situ.  In the meantime, irrigated with 7 liters of saline.  I changed my gown and gloves.  I also removed her appendix with Endo-GIA as it was impacted with stool and this was taken as well.  I then made sure that alignment was good.  I closed the fascia with a running double-stranded PDS.  This was tied in the middle. The wound was packed with saline gauze.  It was very shallow  anyway. Colostomy was matured with a running locking 3-0 chromic and the patient seemed to tolerate the procedure well and was taken to the recovery room prior to taking her to [facility].  FINAL DIAGNOSIS:  Stercoral perforation, status post subtotal colectomy with Hartmann and end ascending colostomy.     Stacy Caprice Hassell Done, MD     MBM/MEDQ  D:  11/29/2015  T:  11/29/2015  Job:  [id number]

## 2015-11-29 NOTE — H&P (Signed)
Chief Complaint:  Severe persistant lower abdominal pain  History of Present Illness:  Stacy Dalton is an 65 y.o. female who had a stercoral perforation of the colon treated by Dr. Dalbert Batman in 2013, treated with Jeanette Caprice and Grover reversal presented two days ago with abdominal pain and thickening of the colon on CT.  Today, she has worsening pain and on CT has perforated her left colon.  Informed consent was obtained about probable need for a colostomy and colon resection.    Past Medical History  Diagnosis Date  . Hypertension   . Depression   . COPD (chronic obstructive pulmonary disease) ([place])     told has copd, no current inhaler use  . GERD (gastroesophageal reflux disease)   . Migraine   . Anxiety     panic attacks  . Migraine   . Insomnia   . Hypothyroidism   . Osteopenia     Past Surgical History  Procedure Laterality Date  . Bladder tac    . Laparotomy  10/04/2011, colostomy also    Procedure: EXPLORATORY LAPAROTOMY;  Surgeon: Adin Hector, MD;  Location: [facility];  Service: General;  Laterality: N/A;  left partial colectomy with colostomy  . Tubal ligation    . Laparotomy  04/19/2012    Procedure: EXPLORATORY LAPAROTOMY;  Surgeon: Adin Hector, MD;  Location: [facility];  Service: General;  Laterality: N/A;  . Colostomy closure  04/19/2012    Procedure: COLOSTOMY CLOSURE;  Surgeon: Adin Hector, MD;  Location: [facility];  Service: General;  Laterality: N/A;  Laparotomy, Resection and Closure of Colostomy  . Ventral hernia repair  04/19/2012    Procedure: HERNIA REPAIR VENTRAL ADULT;  Surgeon: Adin Hector, MD;  Location: [facility];  Service: General;  Laterality: N/A;  . Abdominal hysterectomy    . Colon surgery      Current Facility-Administered Medications  Medication Dose Route Frequency Provider Last Rate Last Dose  . piperacillin-tazobactam (ZOSYN) IVPB 3.375 g  3.375 g Intravenous Q8H Anh P Pham, RPH       Current Outpatient Prescriptions  Medication  Sig Dispense Refill  . ALPRAZolam (XANAX) 0.5 MG tablet Take 1 tablet (0.5 mg total) by mouth daily as needed for anxiety. 60 tablet 3  . atorvastatin (LIPITOR) 40 MG tablet Take 40 mg by mouth daily.     . AZOR 10-40 MG tablet TAKE 1 TABLET DAILY 90 tablet 1  . ciprofloxacin (CIPRO) 500 MG tablet Take 1 tablet (500 mg total) by mouth 2 (two) times daily. One po bid x 7 days 14 tablet 0  . COMBIVENT RESPIMAT 20-100 MCG/ACT AERS respimat Inhale 1 puff into the lungs every 6 (six) hours as needed for wheezing.     . DULoxetine (CYMBALTA) 60 MG capsule Take 1 capsule (60 mg total) by mouth daily. 90 capsule 3  . fluticasone (FLONASE) 50 MCG/ACT nasal spray Place 2 sprays into both nostrils daily. (Patient taking differently: Place 2 sprays into both nostrils daily as needed for allergies. ) 16 g 6  . Fluticasone-Salmeterol (ADVAIR DISKUS) 100-50 MCG/DOSE AEPB Inhale 1 puff into the lungs 2 (two) times daily. 180 each 1  . HYDROcodone-acetaminophen (NORCO/VICODIN) 5-325 MG tablet Take 1 tablet by mouth every 6 (six) hours as needed for moderate pain. 30 tablet 0  . HYDROcodone-homatropine (HYCODAN) 5-1.5 MG/5ML syrup Take 5 mLs by mouth every 8 (eight) hours as needed for cough. 120 mL 0  . levothyroxine (SYNTHROID, LEVOTHROID) 50 MCG tablet TAKE 1  TABLET DAILY 90 tablet 2  . metroNIDAZOLE (FLAGYL) 500 MG tablet Take 1 tablet (500 mg total) by mouth 3 (three) times daily. One po bid x 7 days 21 tablet 0  . ondansetron (ZOFRAN) 4 MG tablet Take 1 tablet (4 mg total) by mouth every 8 (eight) hours as needed for nausea or vomiting. 20 tablet 0  . traMADol (ULTRAM) 50 MG tablet Take 1 tablet (50 mg total) by mouth every 6 (six) hours as needed. (Patient taking differently: Take 50 mg by mouth every 6 (six) hours as needed for moderate pain. ) 90 tablet 3  . triamterene-hydrochlorothiazide (DYAZIDE) 37.5-25 MG capsule TAKE 1 CAPSULE DAILY 90 capsule 0  . Vitamin D, Ergocalciferol, (DRISDOL) 50000 units CAPS  capsule TAKE 1 CAPSULE EVERY 7 DAYS 12 capsule 0  . zolpidem (AMBIEN CR) 12.5 MG CR tablet TAKE 1 TABLET DAILY AT BEDTIME AS NEEDED (Patient taking differently: Take 12.5 mg by mouth at bedtime as needed for sleep. ) 90 tablet 3   Toradol; Demerol; Esgic; and Iodine Family History  Problem Relation Age of Onset  . Heart disease Mother   . Cancer Sister     sinus  . Diabetes Brother   . Heart disease Brother   . Cancer Sister     abdominal ?  . Thyroid disease Sister   . Cancer Other     GE junction adenocarcinoma  . Emphysema Father   . Heart disease Father   . Stroke Father   . COPD Sister   . Diabetes Brother    Social History:   reports that she quit smoking about 18 years ago. Her smoking use included Cigarettes. She has a 30 pack-year smoking history. She has never used smokeless tobacco. She reports that she does not drink alcohol or use illicit drugs.   REVIEW OF SYSTEMS : Negative except for see problem list  Physical Exam:   Blood pressure 97/54, pulse 99, temperature 101.6 F (38.7 C), temperature source Rectal, resp. rate 15, height 5' 1" (1.549 m), weight 54.432 kg (120 lb), SpO2 100 %. Body mass index is 22.69 kg/(m^2).  Gen:  WDWN WF NAD  Neurological: Alert and oriented to person, place, and time. Motor and sensory function is grossly intact  Head: Normocephalic and atraumatic.  Eyes: Conjunctivae are normal. Pupils are equal, round, and reactive to light. No scleral icterus.  Neck: Normal range of motion. Neck supple. No tracheal deviation or thyromegaly present.  Cardiovascular:  SR without murmurs or gallops.  No carotid bruits Breast:  Not examined Respiratory: Effort normal.  No respiratory distress. No chest wall tenderness. Breath sounds normal.  No wheezes, rales or rhonchi.  Abdomen:  Tender abdomen with guarding GU:  Not examined Musculoskeletal: Normal range of motion. Extremities are nontender. No cyanosis, edema or clubbing  noted Lymphadenopathy: No cervical, preauricular, postauricular or axillary adenopathy is present Skin: Skin is warm and dry. No rash noted. No diaphoresis. No erythema. No pallor. Pscyh: Normal mood and affect. Behavior is normal. Judgment and thought content normal.   LABORATORY RESULTS: Results for orders placed or performed during the hospital encounter of 11/29/15 (from the past 48 hour(s))  Comprehensive metabolic panel     Status: Abnormal   Collection Time: 11/29/15 12:53 PM  Result Value Ref Range   Sodium 131 (L) 135 - 145 mmol/L   Potassium 3.4 (L) 3.5 - 5.1 mmol/L   Chloride 93 (L) 101 - 111 mmol/L   CO2 29 22 - 32 mmol/L  Glucose, Bld 155 (H) 65 - 99 mg/dL   BUN 23 (H) 6 - 20 mg/dL   Creatinine, Ser 1.55 (H) 0.44 - 1.00 mg/dL   Calcium 8.4 (L) 8.9 - 10.3 mg/dL   Total Protein 6.3 (L) 6.5 - 8.1 g/dL   Albumin 3.2 (L) 3.5 - 5.0 g/dL   AST 85 (H) 15 - 41 U/L   ALT 108 (H) 14 - 54 U/L   Alkaline Phosphatase 88 38 - 126 U/L   Total Bilirubin 0.4 0.3 - 1.2 mg/dL   GFR calc non Af Amer 34 (L) >60 mL/min   GFR calc Af Amer 39 (L) >60 mL/min    Comment: (NOTE) The eGFR has been calculated using the CKD EPI equation. This calculation has not been validated in all clinical situations. eGFR's persistently <60 mL/min signify possible Chronic Kidney Disease.    Anion gap 9 5 - 15  CBC with Differential     Status: Abnormal   Collection Time: 11/29/15 12:53 PM  Result Value Ref Range   WBC 22.5 (H) 4.0 - 10.5 K/uL   RBC 4.30 3.87 - 5.11 MIL/uL   Hemoglobin 12.4 12.0 - 15.0 g/dL   HCT 35.6 (L) 36.0 - 46.0 %   MCV 82.8 78.0 - 100.0 fL   MCH 28.8 26.0 - 34.0 pg   MCHC 34.8 30.0 - 36.0 g/dL   RDW 13.3 11.5 - 15.5 %   Platelets 378 150 - 400 K/uL   Neutrophils Relative % 87 %   Neutro Abs 19.7 (H) 1.7 - 7.7 K/uL   Lymphocytes Relative 5 %   Lymphs Abs 1.1 0.7 - 4.0 K/uL   Monocytes Relative 8 %   Monocytes Absolute 1.7 (H) 0.1 - 1.0 K/uL   Eosinophils Relative 0 %    Eosinophils Absolute 0.1 0.0 - 0.7 K/uL   Basophils Relative 0 %   Basophils Absolute 0.0 0.0 - 0.1 K/uL  I-Stat CG4 Lactic Acid, ED     Status: Abnormal   Collection Time: 11/29/15  1:00 PM  Result Value Ref Range   Lactic Acid, Venous 2.36 (HH) 0.5 - 2.0 mmol/L   Comment NOTIFIED PHYSICIAN   Urinalysis, Routine w reflex microscopic     Status: Abnormal   Collection Time: 11/29/15  2:40 PM  Result Value Ref Range   Color, Urine AMBER (A) YELLOW    Comment: BIOCHEMICALS MAY BE AFFECTED BY COLOR   APPearance CLEAR CLEAR   Specific Gravity, Urine 1.015 1.005 - 1.030   pH 5.5 5.0 - 8.0   Glucose, UA NEGATIVE NEGATIVE mg/dL   Hgb urine dipstick NEGATIVE NEGATIVE   Bilirubin Urine NEGATIVE NEGATIVE   Ketones, ur NEGATIVE NEGATIVE mg/dL   Protein, ur NEGATIVE NEGATIVE mg/dL   Nitrite NEGATIVE NEGATIVE   Leukocytes, UA NEGATIVE NEGATIVE    Comment: MICROSCOPIC NOT DONE ON URINES WITH NEGATIVE PROTEIN, BLOOD, LEUKOCYTES, NITRITE, OR GLUCOSE <1000 mg/dL.     RADIOLOGY RESULTS: Ct Abdomen Pelvis Wo Contrast  11/29/2015  CLINICAL DATA:  Severe abdominal pain EXAM: CT ABDOMEN AND PELVIS WITHOUT CONTRAST TECHNIQUE: Multidetector CT imaging of the abdomen and pelvis was performed following the standard protocol without IV contrast. COMPARISON:  11/25/2015 FINDINGS: Lung bases are free of acute infiltrate or sizable effusion. The gallbladder is distended. No definitive cholelithiasis is seen. The liver, spleen,, adrenal glands, pancreas and kidneys are within normal limits. The appendix is within normal limits. The bladder is well distended. There are changes consistent with free intraperitoneal air likely related  to the known thickened colonic wall an inflammatory changes in the descending colon. These are stable in appearance from the prior exam. The uterus has been surgically removed. No pelvic mass lesion is noted. The appendix is within normal limits. The osseous structures show degenerative  change of the lumbar spine. IMPRESSION: Persistent thickening of the colonic wall in the descending and sigmoid colon with pericolonic inflammatory change. There are now noted changes of significant perforation with free intraperitoneal air. A large amount of air is noted in the region of the colonic wall thickening in this likely corresponds to the perforation site. Critical Value/emergent results were called by telephone at the time of interpretation on 11/29/2015 at 2:41 pm to [facility] , who verbally acknowledged these results. Electronically Signed   By: Inez Catalina M.D.   On: 11/29/2015 14:47    Problem List: Patient Active Problem List   Diagnosis Date Noted  . COPD (chronic obstructive pulmonary disease) ([place]) 09/20/2015  . Osteopenia 06/10/2014  . Vitamin D deficiency disease 06/10/2014  . Hypothyroidism   . Depression 11/18/2012  . Insomnia 11/18/2012  . Chronic pain syndrome 11/18/2012  . HLD (hyperlipidemia) 09/01/2012  . GAD (generalized anxiety disorder) 09/01/2012    Class: Chronic  . S/P colostomy ([place]) 04/19/2012  . Normocytic anemia 10/15/2011  . Hypertension 10/13/2011  . Chronic back pain 10/13/2011    Assessment & Plan: Perforated colon with WBC 22k and sepsis.  To OR for exploratory laparotomy and control of contamination.      Matt B. Hassell Done, MD, [facility] [phone] beeper [phone]  11/29/2015 3:47 PM

## 2015-11-29 NOTE — Transfer of Care (Signed)
Immediate Anesthesia Transfer of Care Note  Patient: Stacy Dalton  Procedure(s) Performed: Procedure(s): EXPLORATORY LAPAROTOMY; SUBTOTAL COLECTOMY WITH HARTMAN PROCEDURE AND END COLOSTOMY (N/A)  Patient Location: PACU  Anesthesia Type:General  Level of Consciousness: awake, alert  and oriented  Airway & Oxygen Therapy: Patient Spontanous Breathing and Patient connected to face mask oxygen  Post-op Assessment: Report given to RN and Post -op Vital signs reviewed and stable  Post vital signs: Reviewed and stable  Last Vitals:  Filed Vitals:   11/29/15 1440 11/29/15 1536  BP: 101/48 97/54  Pulse: 92 99  Temp:    Resp: 14 15    Last Pain:  Filed Vitals:   11/29/15 1537  PainSc: 10-Worst pain ever         Complications: No apparent anesthesia complications

## 2015-11-29 NOTE — ED Notes (Addendum)
Patient transported to the OR 

## 2015-11-29 NOTE — Anesthesia Procedure Notes (Signed)
Procedure Name: Intubation Date/Time: 11/29/2015 4:31 PM Performed by: Glory Buff Pre-anesthesia Checklist: Patient identified, Emergency Drugs available, Suction available and Patient being monitored Patient Re-evaluated:Patient Re-evaluated prior to inductionOxygen Delivery Method: Circle System Utilized Preoxygenation: Pre-oxygenation with 100% oxygen Intubation Type: IV induction Ventilation: Mask ventilation without difficulty Laryngoscope Size: Miller and 3 Grade View: Grade I Tube type: Oral Tube size: 7.5 mm Number of attempts: 1 Airway Equipment and Method: Stylet and Oral airway Placement Confirmation: ETT inserted through vocal cords under direct vision,  positive ETCO2 and breath sounds checked- equal and bilateral Secured at: 20 cm Tube secured with: Tape Dental Injury: Teeth and Oropharynx as per pre-operative assessment

## 2015-11-29 NOTE — ED Provider Notes (Signed)
CSN: [id number]     Arrival date & time 11/29/15  1144 History   First MD Initiated Contact with Patient 11/29/15 1155     Chief Complaint  Patient presents with  . Abdominal Pain  . Fever  . Nausea     (Consider location/radiation/quality/duration/timing/severity/associated sxs/prior Treatment) HPI Patient presents to the emergency department with abdominal pain that started 10 days ago.  The patient states that she started with lower abdominal pain and felt like that it might improve, but got worse.  She states she was seen in the emergency department one week ago and diagnosed with colitis and placed on Cipro and Flagyl.  Patient states that she has not had any vomiting, but significant nausea.  Patient states that she is not able to eat and drink very well due to not feeling well in her pain.  The patient states that nothing seems to make the condition better, but palpation of her abdomen makes the pain worse.  She states the antibiotics also made her nausea worse. The patient denies chest pain, shortness of breath, headache,blurred vision, neck pain, fever, cough, weakness, numbness, dizziness, anorexia, edema,, diarrhea, rash, back pain, dysuria, hematemesis, bloody stool, near syncope, or syncope. Past Medical History  Diagnosis Date  . Hypertension   . Depression   . COPD (chronic obstructive pulmonary disease) ([place])     told has copd, no current inhaler use  . GERD (gastroesophageal reflux disease)   . Migraine   . Anxiety     panic attacks  . Migraine   . Insomnia   . Hypothyroidism   . Osteopenia    Past Surgical History  Procedure Laterality Date  . Bladder tac    . Laparotomy  10/04/2011, colostomy also    Procedure: EXPLORATORY LAPAROTOMY;  Surgeon: Adin Hector, MD;  Location: [facility];  Service: General;  Laterality: N/A;  left partial colectomy with colostomy  . Tubal ligation    . Laparotomy  04/19/2012    Procedure: EXPLORATORY LAPAROTOMY;  Surgeon: Adin Hector, MD;  Location: [facility];  Service: General;  Laterality: N/A;  . Colostomy closure  04/19/2012    Procedure: COLOSTOMY CLOSURE;  Surgeon: Adin Hector, MD;  Location: [facility];  Service: General;  Laterality: N/A;  Laparotomy, Resection and Closure of Colostomy  . Ventral hernia repair  04/19/2012    Procedure: HERNIA REPAIR VENTRAL ADULT;  Surgeon: Adin Hector, MD;  Location: [facility];  Service: General;  Laterality: N/A;  . Abdominal hysterectomy    . Colon surgery     Family History  Problem Relation Age of Onset  . Heart disease Mother   . Cancer Sister     sinus  . Diabetes Brother   . Heart disease Brother   . Cancer Sister     abdominal ?  . Thyroid disease Sister   . Cancer Other     GE junction adenocarcinoma  . Emphysema Father   . Heart disease Father   . Stroke Father   . COPD Sister   . Diabetes Brother    Social History  Substance Use Topics  . Smoking status: Former Smoker -- 1.50 packs/day for 20 years    Types: Cigarettes    Quit date: 10/03/1997  . Smokeless tobacco: Never Used  . Alcohol Use: No   OB History    No data available     Review of Systems  All other systems negative except as documented in the HPI. All pertinent  positives and negatives as reviewed in the HPI.  Allergies  Toradol; Demerol; Esgic; and Iodine  Home Medications   Prior to Admission medications   Medication Sig Start Date End Date Taking? Authorizing Provider  ALPRAZolam [name]) 0.5 MG tablet Take 1 tablet (0.5 mg total) by mouth daily as needed for anxiety. 09/20/15  Yes [name], FNP  atorvastatin (LIPITOR) 40 MG tablet Take 40 mg by mouth daily.  09/19/15  Yes Historical Provider, MD  AZOR 10-40 MG tablet TAKE 1 TABLET DAILY 09/16/15  Yes [name], FNP  ciprofloxacin (CIPRO) 500 MG tablet Take 1 tablet (500 mg total) by mouth 2 (two) times daily. One po bid x 7 days 11/25/15  Yes [name], MD  COMBIVENT RESPIMAT 20-100 MCG/ACT AERS respimat  Inhale 1 puff into the lungs every 6 (six) hours as needed for wheezing.  09/19/15  Yes Historical Provider, MD  DULoxetine (CYMBALTA) 60 MG capsule Take 1 capsule (60 mg total) by mouth daily. 04/24/14  Yes [name], FNP  fluticasone (FLONASE) 50 MCG/ACT nasal spray Place 2 sprays into both nostrils daily. Patient taking differently: Place 2 sprays into both nostrils daily as needed for allergies.  04/09/15  Yes [name], MD  Fluticasone-Salmeterol (ADVAIR DISKUS) 100-50 MCG/DOSE AEPB Inhale 1 puff into the lungs 2 (two) times daily. 11/18/15  Yes [name], FNP  HYDROcodone-acetaminophen (NORCO/VICODIN) 5-325 MG tablet Take 1 tablet by mouth every 6 (six) hours as needed for moderate pain. 11/26/15  Yes [name], FNP  HYDROcodone-homatropine (HYCODAN) 5-1.5 MG/5ML syrup Take 5 mLs by mouth every 8 (eight) hours as needed for cough. 11/18/15  Yes [name], FNP  levothyroxine (SYNTHROID, LEVOTHROID) 50 MCG tablet TAKE 1 TABLET DAILY 06/07/15  Yes [name], FNP  metroNIDAZOLE (FLAGYL) 500 MG tablet Take 1 tablet (500 mg total) by mouth 3 (three) times daily. One po bid x 7 days 11/25/15  Yes [name], MD  ondansetron (ZOFRAN) 4 MG tablet Take 1 tablet (4 mg total) by mouth every 8 (eight) hours as needed for nausea or vomiting. 11/26/15  Yes [name], FNP  traMADol (ULTRAM) 50 MG tablet Take 1 tablet (50 mg total) by mouth every 6 (six) hours as needed. Patient taking differently: Take 50 mg by mouth every 6 (six) hours as needed for moderate pain.  11/18/15  Yes [name], FNP  triamterene-hydrochlorothiazide (DYAZIDE) 37.5-25 MG capsule TAKE 1 CAPSULE DAILY 11/02/15  Yes [name], FNP  Vitamin D, Ergocalciferol, (DRISDOL) 50000 units CAPS capsule TAKE 1 CAPSULE EVERY 7 DAYS 11/08/15  Yes [name], MD  zolpidem (AMBIEN CR) 12.5 MG CR tablet TAKE 1 TABLET DAILY AT BEDTIME AS NEEDED Patient taking differently: Take 12.5 mg by mouth at  bedtime as needed for sleep.  08/17/15  Yes [name], FNP   BP 100/50 mmHg  Pulse 95  Temp(Src) 101.6 F (38.7 C) (Rectal)  Resp 16  Ht 5\' 1"  (1.549 m)  Wt 54.432 kg  BMI 22.69 kg/m2  SpO2 96% Physical Exam  Constitutional: She is oriented to person, place, and time. She appears well-developed and well-nourished. No distress.  HENT:  Head: Normocephalic and atraumatic.  Mouth/Throat: Oropharynx is clear and moist.  Eyes: Pupils are equal, round, and reactive to light.  Neck: Normal range of motion. Neck supple.  Cardiovascular: Normal rate, regular rhythm and normal heart sounds.  Exam reveals no gallop and no friction rub.  No murmur heard. Pulmonary/Chest: Effort normal and breath sounds normal. No respiratory distress. She has no wheezes.  Abdominal: Soft. Normal appearance and bowel sounds are normal. She exhibits no distension. There is tenderness. There is guarding. There is no rigidity, no rebound and no CVA tenderness. No hernia.  Neurological: She is alert and oriented to person, place, and time. She exhibits normal muscle tone. Coordination normal.  Skin: Skin is warm and dry. No rash noted. No erythema.  Psychiatric: She has a normal mood and affect. Her behavior is normal.  Nursing note and vitals reviewed.   ED Course  Procedures (including critical care time) Labs Review Labs Reviewed  CBC WITH DIFFERENTIAL/PLATELET - Abnormal; Notable for the following:    WBC 22.5 (*)    HCT 35.6 (*)    Neutro Abs 19.7 (*)    Monocytes Absolute 1.7 (*)    All other components within normal limits  I-STAT CG4 LACTIC ACID, ED - Abnormal; Notable for the following:    Lactic Acid, Venous 2.36 (*)    All other components within normal limits  URINE CULTURE  CULTURE, BLOOD (ROUTINE X 2)  CULTURE, BLOOD (ROUTINE X 2)  COMPREHENSIVE METABOLIC PANEL  URINALYSIS, ROUTINE W REFLEX MICROSCOPIC (NOT AT [facility])    Imaging Review Ct Abdomen Pelvis Wo Contrast  11/29/2015   CLINICAL DATA:  Severe abdominal pain EXAM: CT ABDOMEN AND PELVIS WITHOUT CONTRAST TECHNIQUE: Multidetector CT imaging of the abdomen and pelvis was performed following the standard protocol without IV contrast. COMPARISON:  11/25/2015 FINDINGS: Lung bases are free of acute infiltrate or sizable effusion. The gallbladder is distended. No definitive cholelithiasis is seen. The liver, spleen,, adrenal glands, pancreas and kidneys are within normal limits. The appendix is within normal limits. The bladder is well distended. There are changes consistent with free intraperitoneal air likely related to the known thickened colonic wall an inflammatory changes in the descending colon. These are stable in appearance from the prior exam. The uterus has been surgically removed. No pelvic mass lesion is noted. The appendix is within normal limits. The osseous structures show degenerative change of the lumbar spine. IMPRESSION: Persistent thickening of the colonic wall in the descending and sigmoid colon with pericolonic inflammatory change. There are now noted changes of significant perforation with free intraperitoneal air. A large amount of air is noted in the region of the colonic wall thickening in this likely corresponds to the perforation site. Critical Value/emergent results were called by telephone at the time of interpretation on 11/29/2015 at 2:41 pm to [facility] , who verbally acknowledged these results. Electronically Signed   By: Inez Catalina M.D.   On: 11/29/2015 14:47   I have personally reviewed and evaluated these images and lab results as part of my medical decision-making.   EKG Interpretation   Date/Time:  Monday Nov 29 2015 12:38:56 EDT Ventricular Rate:  95 PR Interval:  132 QRS Duration: 82 QT Interval:  333 QTC Calculation: 419 R Axis:   82 Text Interpretation:  Sinus rhythm Borderline right axis deviation  Confirmed by Hazle Coca [phone]) on 11/29/2015 1:06:49 PM       CRITICAL CARE Performed by: Brent [facility] Total critical care time:45 minutes Critical care time was exclusive of separately billable procedures and treating other patients. Critical care was necessary to treat or prevent imminent or life-threatening deterioration. Critical care was time spent personally by me on the following activities: development of treatment plan with patient and/or surrogate as well as nursing,  discussions with consultants, evaluation of patient's response to treatment, examination of patient, obtaining history from patient or surrogate, ordering and performing treatments and interventions, ordering and review of laboratory studies, ordering and review of radiographic studies, pulse oximetry and re-evaluation of patient's condition.   The patient will be taken to the operating room for her perforation of her colon spoke with Dr. Hassell Done, of general surgery who advised that he will be in to see the patient.  The patient is advised of the plan and all questions were answered.  The patient was hypotensive, has 2 IVs and IV fluids for presumed sepsis with this perforation  Dalia Heading, PA-C 11/29/15 [place], MD 12/09/15 1750

## 2015-11-29 NOTE — ED Notes (Signed)
Consent signed at bedside.  °

## 2015-11-29 NOTE — Progress Notes (Signed)
Pharmacy Antibiotic Note  Stacy Dalton is a 65 y.o. female recently diagnosed with colitis at [facility] ED on 5/25 and sent home with cipro and flagyl prescriptions for 7 day course.  She presented back to the ED on 11/29/2015 with c/o abdominal pain, nausea and fever.  To start zosyn for suspected intra-abdominal infection.   Plan: - zosyn 3.375 IV x1 over 30 minutes, then 3.375 gm IV q8h (infuse over 4 hours)  ___________________________  Height: 5\' 1"  (154.9 cm) Weight: 120 lb (54.432 kg) IBW/kg (Calculated) : 47.8  Temp (24hrs), Avg:100.3 F (37.9 C), Min:99 F (37.2 C), Max:101.6 F (38.7 C)   Recent Labs Lab 11/24/15 2212 11/29/15 1253 11/29/15 1300  WBC 16.1* 22.5*  --   CREATININE 0.98  --   --   LATICACIDVEN  --   --  2.36*    Estimated Creatinine Clearance: 42 mL/min (by C-G formula based on Cr of 0.98).    Allergies  Allergen Reactions  . Toradol [Ketorolac Tromethamine] Shortness Of Breath  . Demerol Swelling  . Esgic [Butalbital-Apap-Caffeine]     jittery  . Iodine Other (See Comments)    bp bottomed out per pt several hours later; unsure if pre medicated in past with cm; done in [place]    Thank you for allowing pharmacy to be a part of this patient's care.  Lynelle Doctor 11/29/2015 1:17 PM

## 2015-11-29 NOTE — ED Notes (Signed)
Bed: [facility] Expected date:  Expected time:  Means of arrival:  Comments: 66F/abd pain/[facility]

## 2015-11-29 NOTE — ED Notes (Signed)
Patient transported to CT 

## 2015-11-29 NOTE — ED Notes (Signed)
Surgeon at bedside.  

## 2015-11-30 ENCOUNTER — Encounter (HOSPITAL_COMMUNITY): Payer: Self-pay | Admitting: Surgery

## 2015-11-30 ENCOUNTER — Ambulatory Visit: Payer: Medicare Other | Admitting: Family

## 2015-11-30 LAB — CBC
HCT: 35.8 % — ABNORMAL LOW (ref 36.0–46.0)
Hemoglobin: 12.4 g/dL (ref 12.0–15.0)
MCH: 28.9 pg (ref 26.0–34.0)
MCHC: 34.6 g/dL (ref 30.0–36.0)
MCV: 83.4 fL (ref 78.0–100.0)
Platelets: 397 10*3/uL (ref 150–400)
RBC: 4.29 MIL/uL (ref 3.87–5.11)
RDW: 13.6 % (ref 11.5–15.5)
WBC: 18.1 10*3/uL — ABNORMAL HIGH (ref 4.0–10.5)

## 2015-11-30 LAB — BASIC METABOLIC PANEL
Anion gap: 7 (ref 5–15)
BUN: 9 mg/dL (ref 6–20)
CO2: 24 mmol/L (ref 22–32)
Calcium: 7.6 mg/dL — ABNORMAL LOW (ref 8.9–10.3)
Chloride: 103 mmol/L (ref 101–111)
Creatinine, Ser: 0.94 mg/dL (ref 0.44–1.00)
GFR calc Af Amer: >60 mL/min (ref >60)
GFR calc non Af Amer: >60 mL/min (ref >60)
Glucose, Bld: 278 mg/dL — ABNORMAL HIGH (ref 65–99)
Potassium: 3.9 mmol/L (ref 3.5–5.1)
Sodium: 134 mmol/L — ABNORMAL LOW (ref 135–145)

## 2015-11-30 LAB — TSH: TSH: 0.811 u[IU]/mL (ref 0.350–4.500)

## 2015-11-30 MED ORDER — ONDANSETRON HCL 4 MG/2ML IJ SOLN
4.0000 mg | Freq: Four times a day (QID) | INTRAMUSCULAR | Status: DC | PRN
  Administered 2015-12-02 – 2015-12-03 (×2): 4 mg via INTRAVENOUS
  Filled 2015-11-30 (×2): qty 2

## 2015-11-30 MED ORDER — NALOXONE HCL 0.4 MG/ML IJ SOLN: 0.4000 mg | INTRAMUSCULAR | Status: DC | PRN

## 2015-11-30 MED ORDER — ACETAMINOPHEN 10 MG/ML IV SOLN
1000.0000 mg | Freq: Four times a day (QID) | INTRAVENOUS | Status: AC
  Administered 2015-11-30 – 2015-12-01 (×4): 1000 mg via INTRAVENOUS
  Filled 2015-11-30 (×7): qty 100

## 2015-11-30 MED ORDER — SODIUM CHLORIDE 0.9% FLUSH: 9.0000 mL | INTRAVENOUS | Status: DC | PRN

## 2015-11-30 MED ORDER — LEVOTHYROXINE SODIUM 100 MCG IV SOLR
25.0000 ug | Freq: Every day | INTRAVENOUS | Status: DC
  Administered 2015-11-30 – 2015-12-01 (×2): 25 ug via INTRAVENOUS
  Filled 2015-11-30 (×3): qty 5

## 2015-11-30 MED ORDER — DIPHENHYDRAMINE HCL 50 MG/ML IJ SOLN: 12.5000 mg | Freq: Four times a day (QID) | INTRAMUSCULAR | Status: DC | PRN

## 2015-11-30 MED ORDER — HYDROMORPHONE 1 MG/ML IV SOLN
INTRAVENOUS | Status: DC
  Administered 2015-11-30: 09:00:00 via INTRAVENOUS
  Administered 2015-11-30: 3.3 mg via INTRAVENOUS
  Administered 2015-11-30: 1.8 mg via INTRAVENOUS
  Administered 2015-12-01 (×2): 3 mg via INTRAVENOUS
  Administered 2015-12-01: 4.8 mg via INTRAVENOUS
  Administered 2015-12-01: 2.7 mg via INTRAVENOUS
  Administered 2015-12-02: 3 mg via INTRAVENOUS
  Administered 2015-12-02: 21:00:00 via INTRAVENOUS
  Administered 2015-12-02 – 2015-12-03 (×2): 3.6 mg via INTRAVENOUS
  Administered 2015-12-03: 2.7 mg via INTRAVENOUS
  Filled 2015-11-30 (×3): qty 25

## 2015-11-30 MED ORDER — POTASSIUM CHLORIDE IN NACL 20-0.9 MEQ/L-% IV SOLN
INTRAVENOUS | Status: DC
  Administered 2015-11-30 – 2015-12-03 (×5): via INTRAVENOUS
  Filled 2015-11-30 (×9): qty 1000

## 2015-11-30 MED ORDER — DIPHENHYDRAMINE HCL 12.5 MG/5ML PO ELIX: 12.5000 mg | ORAL_SOLUTION | Freq: Four times a day (QID) | ORAL | Status: DC | PRN

## 2015-11-30 NOTE — Progress Notes (Signed)
1 Day Post-Op  Subjective: She looks very good, would like the PCA for pain.  Says she is just using Tramadol for her back pain at home.  She has some sweat in ostomy bag, nothing else currently.  Open site looks fine.  She says she has not smoked since 2000.  It's been several days since she had a BM, she cannot really tell you when it occurred.  Objective: Vital signs in last 24 hours: Temp:  [98 F (36.7 C)-101.6 F (38.7 C)] 98.1 F (36.7 C) (05/30 0400) Pulse Rate:  [88-102] 89 (05/30 0700) Resp:  [10-22] 13 (05/30 0700) BP: (96-141)/(48-66) 117/55 mmHg (05/30 0700) SpO2:  [91 %-100 %] 99 % (05/30 0700) Weight:  [54.432 kg (120 lb)] 54.432 kg (120 lb) (05/29 1303)    3652 IV Urine 1000 recorded Afebrile since surgery, 101 on admit, VSS NA 134, creatinine is better, back to baseline normal, from 1.55 on admit, Glucose is 278  Intake/Output from previous day: 05/29 0701 - 05/30 0700 In: 3652.1 [I.V.:3552.1; IV Piggyback:100] Out: 1115 [Urine:1000; Stool:15; Blood:100] Intake/Output this shift:    General appearance: alert, cooperative, no distress and complaining of pain, 8/10, but she doesn't really look that uncomfortable. Resp: clear to auscultation bilaterally and anterior exam GI: soft, tender, very sore.  Ostomy looks fine, sweat from the ostomy.  Open wound looks fine, dressing changed.  Few BS  Lab Results:   Recent Labs  11/29/15 1913 11/30/15 0305  WBC 15.1* 18.1*  HGB 12.3 12.4  HCT 36.9 35.8*  PLT 412* 397    BMET  Recent Labs  11/29/15 1913 11/30/15 0305  NA 134* 134*  K 3.2* 3.9  CL 102 103  CO2 23 24  GLUCOSE 152* 278*  BUN 15 9  CREATININE 1.07* 0.94  CALCIUM 7.3* 7.6*   PT/INR No results for input(s): LABPROT, INR in the last 72 hours.   Recent Labs Lab 11/24/15 2212 11/29/15 1253  AST 20 85*  ALT 14 108*  ALKPHOS 77 88  BILITOT 0.6 0.4  PROT 7.2 6.3*  ALBUMIN 3.8 3.2*     Lipase     Component Value Date/Time   LIPASE  22 11/24/2015 2212     Studies/Results: Ct Abdomen Pelvis Wo Contrast  11/29/2015  CLINICAL DATA:  Severe abdominal pain EXAM: CT ABDOMEN AND PELVIS WITHOUT CONTRAST TECHNIQUE: Multidetector CT imaging of the abdomen and pelvis was performed following the standard protocol without IV contrast. COMPARISON:  11/25/2015 FINDINGS: Lung bases are free of acute infiltrate or sizable effusion. The gallbladder is distended. No definitive cholelithiasis is seen. The liver, spleen,, adrenal glands, pancreas and kidneys are within normal limits. The appendix is within normal limits. The bladder is well distended. There are changes consistent with free intraperitoneal air likely related to the known thickened colonic wall an inflammatory changes in the descending colon. These are stable in appearance from the prior exam. The uterus has been surgically removed. No pelvic mass lesion is noted. The appendix is within normal limits. The osseous structures show degenerative change of the lumbar spine. IMPRESSION: Persistent thickening of the colonic wall in the descending and sigmoid colon with pericolonic inflammatory change. There are now noted changes of significant perforation with free intraperitoneal air. A large amount of air is noted in the region of the colonic wall thickening in this likely corresponds to the perforation site. Critical Value/emergent results were called by telephone at the time of interpretation on 11/29/2015 at 2:41 pm to [facility],  PA , who verbally acknowledged these results. Electronically Signed   By: Inez Catalina M.D.   On: 11/29/2015 14:47    Medications: . heparin subcutaneous  5,000 Units Subcutaneous Q8H  . mometasone-formoterol  2 puff Inhalation BID  . piperacillin-tazobactam (ZOSYN)  IV  3.375 g Intravenous Q8H   . dextrose 5 % and 0.45 % NaCl with KCl 20 mEq/L Stopped (11/30/15 0445)   Prior to Admission medications   Medication Sig Start Date End Date Taking?  Authorizing Provider  ALPRAZolam [name]) 0.5 MG tablet Take 1 tablet (0.5 mg total) by mouth daily as needed for anxiety. 09/20/15  Yes [name], FNP  atorvastatin (LIPITOR) 40 MG tablet Take 40 mg by mouth daily.  09/19/15  Yes Historical Provider, MD  AZOR 10-40 MG tablet TAKE 1 TABLET DAILY 09/16/15  Yes [name], FNP  ciprofloxacin (CIPRO) 500 MG tablet Take 1 tablet (500 mg total) by mouth 2 (two) times daily. One po bid x 7 days 11/25/15  Yes [name], MD  COMBIVENT RESPIMAT 20-100 MCG/ACT AERS respimat Inhale 1 puff into the lungs every 6 (six) hours as needed for wheezing.  09/19/15  Yes Historical Provider, MD  DULoxetine (CYMBALTA) 60 MG capsule Take 1 capsule (60 mg total) by mouth daily. 04/24/14  Yes [name], FNP  fluticasone (FLONASE) 50 MCG/ACT nasal spray Place 2 sprays into both nostrils daily. Patient taking differently: Place 2 sprays into both nostrils daily as needed for allergies.  04/09/15  Yes [name], MD  Fluticasone-Salmeterol (ADVAIR DISKUS) 100-50 MCG/DOSE AEPB Inhale 1 puff into the lungs 2 (two) times daily. 11/18/15  Yes [name], FNP  HYDROcodone-acetaminophen (NORCO/VICODIN) 5-325 MG tablet Take 1 tablet by mouth every 6 (six) hours as needed for moderate pain. 11/26/15  Yes [name], FNP  HYDROcodone-homatropine (HYCODAN) 5-1.5 MG/5ML syrup Take 5 mLs by mouth every 8 (eight) hours as needed for cough. 11/18/15  Yes [name], FNP  levothyroxine (SYNTHROID, LEVOTHROID) 50 MCG tablet TAKE 1 TABLET DAILY 06/07/15  Yes [name], FNP  metroNIDAZOLE (FLAGYL) 500 MG tablet Take 1 tablet (500 mg total) by mouth 3 (three) times daily. One po bid x 7 days 11/25/15  Yes [name], MD  ondansetron (ZOFRAN) 4 MG tablet Take 1 tablet (4 mg total) by mouth every 8 (eight) hours as needed for nausea or vomiting. 11/26/15  Yes [name], FNP  traMADol (ULTRAM) 50 MG tablet Take 1 tablet (50 mg total) by mouth every 6 (six)  hours as needed. Patient taking differently: Take 50 mg by mouth every 6 (six) hours as needed for moderate pain.  11/18/15  Yes [name], FNP  triamterene-hydrochlorothiazide (DYAZIDE) 37.5-25 MG capsule TAKE 1 CAPSULE DAILY 11/02/15  Yes [name], FNP  Vitamin D, Ergocalciferol, (DRISDOL) 50000 units CAPS capsule TAKE 1 CAPSULE EVERY 7 DAYS 11/08/15  Yes [name], MD  zolpidem (AMBIEN CR) 12.5 MG CR tablet TAKE 1 TABLET DAILY AT BEDTIME AS NEEDED Patient taking differently: Take 12.5 mg by mouth at bedtime as needed for sleep.  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 S/p stercoral perforation with left colectomy 10/04/11, Dr. Dalbert Batman S/p Subtotal colectomy, Jeanette Caprice procedure with end ascending colostomy, 11/30/15, Dr. Johnathan Hausen Dehydration with elevated creatinine - resolving Hx of COPD/hx of tobacco use Hx of Migraines Chronic  back pain on chronic pain meds Hypothyroid Hx of anxiety and depression GERD FEN:  IV fluids/NPO ID:  Zosyn day 2 DVT:  Heparin/SCD   Plan:  Recheck TSH, and A1C.  Wet to dry dressings, wound care nurse to help with ostomy, PCA and IV tylenol.  IS, OOB today.  Pull foley tomorrow if labs and urine output remain normal.       LOS: 1 day     Tanveer Brammer 11/30/2015 [phone]

## 2015-11-30 NOTE — Care Management Note (Signed)
Case Management Note  Patient Details  Name: [name] MRN: [id number] Date of Birth: Dec 22, 1950  Subjective/Objective:          sepsis          Action/Plan:Date:  Nov 30, 2015 Chart reviewed for concurrent status and case management needs. Will continue to follow patient for changes and needs: Expected discharge date: [id number] Velva Harman, BSN, [place], [place]   Expected Discharge Date:   (Unknown)               Expected Discharge Plan:  Home/Self Care  In-House Referral:  NA  Discharge planning Services  CM Consult  Post Acute Care Choice:  NA Choice offered to:  NA  DME Arranged:  N/A DME Agency:  NA  HH Arranged:  NA HH Agency:  NA  Status of Service:  In process, will continue to follow  Medicare Important Message Given:    Date Medicare IM Given:    Medicare IM give by:    Date Additional Medicare IM Given:    Additional Medicare Important Message give by:     If discussed at [place] of Stay Meetings, dates discussed:    Additional Comments:  Leeroy Cha, RN 11/30/2015, 8:25 AM

## 2015-11-30 NOTE — Progress Notes (Signed)
Initial Nutrition Assessment  DOCUMENTATION CODES:   Not applicable  INTERVENTION:  - Will monitor for diet advancement versus need for nutrition support - RD will see pt for follow-up on 5/31  NUTRITION DIAGNOSIS:   Inadequate oral intake related to inability to eat as evidenced by NPO status.  GOAL:   Patient will meet greater than or equal to 90% of their needs  MONITOR:   Diet advancement, Weight trends, Labs, Skin, I & O's  REASON FOR ASSESSMENT:   Malnutrition Screening Tool  ASSESSMENT:   65 y.o. female who had a stercoral perforation of the colon treated by Dr. Dalbert Batman in 2013, treated with Jeanette Caprice and subseqent reversal presented two days ago with abdominal pain and thickening of the colon on CT. Today, she has worsening pain and on CT has perforated her left colon. Informed consent was obtained about probable need for a colostomy and colon resection.   Pt seen for MST. BMI indicates normal weight. Pt has been NPO since admission and unable to meet needs. POD #1 ex lap with subtotal colectomy with [place] procedure and end colostomy d/y total colonic impaction and splenic flexture rupture.   Pt sleeping at time of visit and no family/visitors present at bedside at this time. RD will follow-up tomorrow to talk with pt as not to awake her at this time. Per surgery note this AM, pt is unsure when her last BM was PTA.   Will complete physical assessment tomorrow but able to visualize some degree of muscle wasting to clavicle area. Per chart review, pt's weight has fluctuated some over the past 5 months (114-127 lbs). Will talk with pt tomorrow about weight trends.  Medications reviewed. IVF: NS-20 mEq KCl @ 100 mL/hr. Labs reviewed; Na: 134 mmol/L, Ca: 7.6 mg/dL.   Diet Order:  Diet NPO time specified Except for: Ice Chips  Skin:  Wound (see comment) (Abdominal incision)  Last BM:  PTA  Height:   Ht Readings from Last 1 Encounters:  11/29/15 5\' 1"  (1.549 m)     Weight:   Wt Readings from Last 1 Encounters:  11/29/15 120 lb (54.432 kg)    Ideal Body Weight:  47.73 kg (kg)  BMI:  Body mass index is 22.69 kg/(m^2).  Estimated Nutritional Needs:   Kcal:  1600-1800  Protein:  70-80 grams  Fluid:  1.6-1.8 L/day  EDUCATION NEEDS:   No education needs identified at this time     Jarome Matin, RD, LDN Inpatient Clinical Dietitian Pager # [phone] After hours/weekend pager # [phone]

## 2015-11-30 NOTE — Progress Notes (Signed)
Inpatient Diabetes Program Recommendations  AACE/ADA: New Consensus Statement on Inpatient Glycemic Control (2015)  Target Ranges:  Prepandial:   less than 140 mg/dL      Peak postprandial:   less than 180 mg/dL (1-2 hours)      Critically ill patients:  140 - 180 mg/dL   Review of Glycemic Control  Diabetes history: None Outpatient Diabetes medications: N/A Current orders for Inpatient glycemic control: None  Results for DESARAY, MARSCHNER (MRN [id number]) as of 11/30/2015 12:29  Ref. Range 11/29/2015 19:13 11/30/2015 03:05  Sodium Latest Ref Range: 135-145 mmol/L 134 (L) 134 (L)  Potassium Latest Ref Range: 3.5-5.1 mmol/L 3.2 (L) 3.9  Chloride Latest Ref Range: 101-111 mmol/L 102 103  CO2 Latest Ref Range: 22-32 mmol/L 23 24  BUN Latest Ref Range: 6-20 mg/dL 15 9  Creatinine Latest Ref Range: 0.44-1.00 mg/dL 1.07 (H) 0.94  Calcium Latest Ref Range: 8.9-10.3 mg/dL 7.3 (L) 7.6 (L)  EGFR (Non-African Amer.) Latest Ref Range: >60 mL/min 52 (L) >60  EGFR (African American) Latest Ref Range: >60 mL/min >60 >60  Glucose Latest Ref Range: 65-99 mg/dL 152 (H) 278 (H)   Positive family hx DM. Received Decadron 10 mg during surgery.  Inpatient Diabetes Program Recommendations:    Check HgbA1C to assess glycemic control prior to hospitalization Novolog sensitive Q4H.  Will continue to follow. Thank you. Lorenda Peck, RD, LDN, CDE Inpatient Diabetes Coordinator [phone]

## 2015-11-30 NOTE — Consult Note (Signed)
WOC ostomy follow up Stoma type/location: RLQ colostomy Stomal assessment/size: red, edematous, slightly budded, moist, os at center.  1 and 3/4 x 2 inches oval. Peristomal assessment: intact, clear Treatment options for stomal/peristomal skin: skin barrier ring Output: scant amount of serosanguinous drainage in post op pouch Ostomy pouching: 2pc. 2 and 3/4 inch pouching system with skin barrier ring Education provided: Patient was tearful during my visit; she well recalls the stoma created 4 years ago in the LLQ, but notes that her husband was her care provider with that ostomy but has since passed away.  Her daughter remains in a Rehab facility and will not be available for care provision. Patient will require the support of a HHRN following this hospitalization.  If you agree, please order.   Enrolled patient in [place] Start Discharge program: No WOC nursing team will follow, and will remain available to this patient, the nursing, surgical and medical teams.   Thanks, Maudie Flakes, MSN, RN, [place], Arther Abbott  Pager# [phone]

## 2015-11-30 NOTE — Anesthesia Postprocedure Evaluation (Signed)
Anesthesia Post Note  Patient: Stacy Dalton  Procedure(s) Performed: Procedure(s) (LRB): EXPLORATORY LAPAROTOMY; SUBTOTAL COLECTOMY WITH HARTMAN PROCEDURE AND END COLOSTOMY (N/A)  Patient location during evaluation: PACU Anesthesia Type: General Level of consciousness: awake and alert Pain management: pain level controlled Vital Signs Assessment: post-procedure vital signs reviewed and stable Respiratory status: spontaneous breathing, nonlabored ventilation, respiratory function stable and patient connected to nasal cannula oxygen Cardiovascular status: blood pressure returned to baseline and stable Postop Assessment: no signs of nausea or vomiting Anesthetic complications: no    Last Vitals:  Filed Vitals:   11/30/15 1152 11/30/15 1200  BP:  115/52  Pulse:  82  Temp: 36.9 C   Resp:  14    Last Pain:  Filed Vitals:   11/30/15 1251  PainSc: 4                  [name] A

## 2015-12-01 LAB — BASIC METABOLIC PANEL
Anion gap: 4 — ABNORMAL LOW (ref 5–15)
BUN: 16 mg/dL (ref 6–20)
CO2: 25 mmol/L (ref 22–32)
Calcium: 7.7 mg/dL — ABNORMAL LOW (ref 8.9–10.3)
Chloride: 108 mmol/L (ref 101–111)
Creatinine, Ser: 0.78 mg/dL (ref 0.44–1.00)
GFR calc Af Amer: >60 mL/min (ref >60)
GFR calc non Af Amer: >60 mL/min (ref >60)
Glucose, Bld: 136 mg/dL — ABNORMAL HIGH (ref 65–99)
Potassium: 4.6 mmol/L (ref 3.5–5.1)
Sodium: 137 mmol/L (ref 135–145)

## 2015-12-01 LAB — CBC
HCT: 25.2 % — ABNORMAL LOW (ref 36.0–46.0)
HCT: 25.4 % — ABNORMAL LOW (ref 36.0–46.0)
Hemoglobin: 8.5 g/dL — ABNORMAL LOW (ref 12.0–15.0)
Hemoglobin: 8.6 g/dL — ABNORMAL LOW (ref 12.0–15.0)
MCH: 29.2 pg (ref 26.0–34.0)
MCH: 28.7 pg (ref 26.0–34.0)
MCHC: 33.7 g/dL (ref 30.0–36.0)
MCHC: 33.9 g/dL (ref 30.0–36.0)
MCV: 86.6 fL (ref 78.0–100.0)
MCV: 84.7 fL (ref 78.0–100.0)
Platelets: 291 10*3/uL (ref 150–400)
Platelets: 304 10*3/uL (ref 150–400)
RBC: 2.91 MIL/uL — ABNORMAL LOW (ref 3.87–5.11)
RBC: 3 MIL/uL — ABNORMAL LOW (ref 3.87–5.11)
RDW: 13.9 % (ref 11.5–15.5)
RDW: 13.9 % (ref 11.5–15.5)
WBC: 11.3 10*3/uL — ABNORMAL HIGH (ref 4.0–10.5)
WBC: 11.9 10*3/uL — ABNORMAL HIGH (ref 4.0–10.5)

## 2015-12-01 LAB — URINE CULTURE: Culture: NO GROWTH

## 2015-12-01 LAB — HEMOGLOBIN A1C
Hgb A1c MFr Bld: 5.8 % — ABNORMAL HIGH (ref 4.8–5.6)
Mean Plasma Glucose: 120 mg/dL

## 2015-12-01 MED ORDER — ACETAMINOPHEN 10 MG/ML IV SOLN
1000.0000 mg | Freq: Four times a day (QID) | INTRAVENOUS | Status: AC
  Administered 2015-12-01 – 2015-12-02 (×4): 1000 mg via INTRAVENOUS
  Filled 2015-12-01 (×7): qty 100

## 2015-12-01 MED ORDER — SODIUM CHLORIDE 0.9 % IV SOLN
INTRAVENOUS | Status: DC
  Administered 2015-12-01: 12:00:00 via INTRAVENOUS

## 2015-12-01 NOTE — Progress Notes (Signed)
Ria Comment, RN wasted 3 ml/3 mg of dilaudid PCA syringe into sink.  Witnessed by Katherine Mantle, RN.  Iantha Fallen RN 12:29 PM 12/01/2015

## 2015-12-01 NOTE — NC FL2 (Signed)
[place] LEVEL OF CARE SCREENING TOOL     IDENTIFICATION  Patient Name: Stacy Dalton Birthdate: Aug 29, 1950 Sex: female Admission Date (Current Location): 11/29/2015  [facility] and [place] Number:  Herbalist and Address:  [facility],  [place] [address] [place]      Provider Number: [id number]  Attending Physician Name and Address:  Md Edison Pace, MD  Relative Name and Phone Number:       Current Level of Care: Hospital Recommended Level of Care: [place] Prior Approval Number:    Date Approved/Denied:   PASRR Number:   [id number]  Discharge Plan: SNF    Current Diagnoses: Patient Active Problem List   Diagnosis Date Noted  . Peritonitis with abscess of intestine ([place]) 11/29/2015  . COPD (chronic obstructive pulmonary disease) ([place]) 09/20/2015  . Osteopenia 06/10/2014  . Vitamin D deficiency disease 06/10/2014  . Hypothyroidism   . Depression 11/18/2012  . Insomnia 11/18/2012  . Chronic pain syndrome 11/18/2012  . HLD (hyperlipidemia) 09/01/2012  . GAD (generalized anxiety disorder) 09/01/2012    Class: Chronic  . S/P colostomy ([place]) 04/19/2012  . Normocytic anemia 10/15/2011  . Hypertension 10/13/2011  . Chronic back pain 10/13/2011    Orientation RESPIRATION BLADDER Height & Weight     Self, Time, Situation, Place  O2 (2L while in hospital/ reports none used at home.) Continent Weight: 120 lb (54.432 kg) Height:  5\' 1"  (154.9 cm)  BEHAVIORAL SYMPTOMS/MOOD NEUROLOGICAL BOWEL NUTRITION STATUS     stable Colostomy (RLQ) Diet (progressing diet)  AMBULATORY STATUS COMMUNICATION OF NEEDS Skin   Extensive Assist Verbally Surgical wounds                       Personal Care Assistance Level of Assistance  Bathing, Feeding, Dressing Bathing Assistance: Limited assistance Feeding assistance: Independent Dressing Assistance: Limited assistance     Functional Limitations Info  Sight, Hearing,  Speech Sight Info: Adequate Hearing Info: Adequate Speech Info: Adequate    SPECIAL CARE FACTORS FREQUENCY  PT (By licensed PT), OT (By licensed OT) (Wound care Stoma type/location: RLQ colostomy)     PT Frequency: 5 OT Frequency: 5            Contractures Contractures Info: Not present    Additional Factors Info  Code Status, Allergies Code Status Info: Full Code Allergies Info: Toradol, Demerol, Esgic, Iodine           Current Medications (12/01/2015):  This is the current hospital active medication list Current Facility-Administered Medications  Medication Dose Route Frequency Provider Last Rate Last Dose  . 0.9 % NaCl with KCl 20 mEq/ L  infusion   Intravenous Continuous Earnstine Regal, PA-C 100 mL/hr at 12/01/15 1040    . acetaminophen (OFIRMEV) IV 1,000 mg  1,000 mg Intravenous Q6H Earnstine Regal, PA-C   1,000 mg at 12/01/15 1031  . diphenhydrAMINE (BENADRYL) injection 12.5 mg  12.5 mg Intravenous Q6H PRN Earnstine Regal, PA-C       Or  . diphenhydrAMINE (BENADRYL) 12.5 MG/5ML elixir 12.5 mg  12.5 mg Oral Q6H PRN Earnstine Regal, PA-C      . heparin injection 5,000 Units  5,000 Units Subcutaneous Q8H Johnathan Hausen, MD   5,000 Units at 12/01/15 0529  . HYDROmorphone (DILAUDID) 1 mg/mL PCA injection   Intravenous Q4H Earnstine Regal, PA-C      . ipratropium-albuterol (DUONEB) 0.5-2.5 (3) MG/3ML nebulizer solution 3 mL  3 mL Nebulization Q6H  PRN Johnathan Hausen, MD      . levothyroxine (SYNTHROID, LEVOTHROID) injection 25 mcg  25 mcg Intravenous Daily Earnstine Regal, PA-C   25 mcg at 12/01/15 1031  . mometasone-formoterol (DULERA) 100-5 MCG/ACT inhaler 2 puff  2 puff Inhalation BID Johnathan Hausen, MD   2 puff at 12/01/15 0804  . naloxone [facility]) injection 0.4 mg  0.4 mg Intravenous PRN Earnstine Regal, PA-C       And  . sodium chloride flush (NS) 0.9 % injection 9 mL  9 mL Intravenous PRN Earnstine Regal, PA-C      . ondansetron [facility]) tablet 4 mg  4 mg Oral  Q6H PRN Johnathan Hausen, MD       Or  . ondansetron [facility]) injection 4 mg  4 mg Intravenous Q6H PRN Johnathan Hausen, MD   4 mg at 12/01/15 0911  . ondansetron (ZOFRAN) injection 4 mg  4 mg Intravenous Q6H PRN Earnstine Regal, PA-C      . piperacillin-tazobactam (ZOSYN) IVPB 3.375 g  3.375 g Intravenous Q8H Johnathan Hausen, MD   3.375 g at 12/01/15 0529  . promethazine (PHENERGAN) injection 6.25 mg  6.25 mg Intravenous Q15 min PRN Lorrene Reid, MD   6.25 mg at 11/29/15 1920     Discharge Medications: Please see discharge summary for a list of discharge medications.  Relevant Imaging Results:  Relevant Lab Results:   Additional Information Treatment options for stomal/peristomal skin: skin barrier ring   Output: scant amount of serosanguinous drainage in post op pouch    SSN:  [id number]  [place], LCSW

## 2015-12-01 NOTE — Evaluation (Signed)
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 120'. The patient lives alone and can benefit from [facility] acute rehab to return to independence. Pt admitted with above diagnosis. Pt currently with functional limitations due to the deficits listed below (see PT Problem List).  Pt will benefit from skilled PT to increase their independence and safety with mobility to allow discharge to the venue listed below.       Follow Up Recommendations SNF;Supervision/Assistance - 24 hour    Equipment Recommendations  None recommended by PT    Recommendations for Other Services       Precautions / Restrictions Precautions Precaution Comments: colostomy Restrictions Weight Bearing Restrictions: No      Mobility  Bed Mobility Overal bed mobility: Needs Assistance Bed Mobility: Rolling;Sidelying to Sit;Sit to Sidelying Rolling: Min assist Sidelying to sit: Min assist     Sit to sidelying: Min assist General bed mobility comments: cues for log roll, assist with trunk, assist with legs.  Transfers Overall transfer level: Needs assistance Equipment used: Rolling walker (2 wheeled) Transfers: Sit to/from Stand Sit to Stand: Min assist         General transfer comment: cue sfor hand placement  Ambulation/Gait Ambulation/Gait assistance: Min assist Ambulation Distance (Feet): 120 Feet Assistive device: Rolling walker (2 wheeled) Gait Pattern/deviations: Step-to pattern;Step-through pattern        Stairs            Wheelchair Mobility    Modified Rankin (Stroke Patients Only)       Balance                                             Pertinent Vitals/Pain  Pain Assessment: 0-10 Pain Score: 7  Pain Location: abdome Pain Intervention(s): PCA encouraged;Monitored during session    [place] expects to be discharged to:: Private residence Living Arrangements: Alone Available Help at Discharge: Family;Available PRN/intermittently Type of Home: House Home Access: Stairs to enter Entrance Stairs-Rails: None Entrance Stairs-Number of Steps: 1+1 Home Layout: One level Home Equipment: None      Prior Function Level of Independence: Independent               Hand Dominance        Extremity/Trunk Assessment   Upper Extremity Assessment: Generalized weakness           Lower Extremity Assessment: Generalized weakness      Cervical / Trunk Assessment: Normal  Communication   Communication: No difficulties  Cognition Arousal/Alertness: Awake/alert Behavior During Therapy: WFL for tasks assessed/performed Overall Cognitive Status: Within Functional Limits for tasks assessed                      General Comments      Exercises        Assessment/Plan    PT Assessment Patient needs continued PT services  PT Diagnosis Difficulty walking   PT Problem List Decreased activity tolerance;Decreased strength;Decreased mobility;Pain  PT Treatment Interventions DME instruction;Gait training;Functional mobility training;Therapeutic activities;Therapeutic exercise;Patient/family education   PT Goals (Current goals can be found in the Care Plan section) Acute Rehab PT Goals  Patient Stated Goal: to go to rehab PT Goal Formulation: With patient Time For Goal Achievement: 12/15/15 Potential to Achieve Goals: Good    Frequency Min 3X/week   Barriers to discharge        Co-evaluation               End of Session   Activity Tolerance: Patient tolerated treatment well Patient left: in bed;with call bell/phone within reach Nurse Communication: Mobility status         Time: [id number] PT  Time Calculation (min) (ACUTE ONLY): 29 min   Charges:   PT Evaluation $PT Eval Low Complexity: 1 Procedure PT Treatments $Gait Training: 8-22 mins   PT G Codes:        [name] 12/01/2015, 4:42 PM [name] PT [phone]

## 2015-12-01 NOTE — Progress Notes (Signed)
Nutrition Follow-up  DOCUMENTATION CODES:   Not applicable  INTERVENTION:  - Diet advancement as medically feasible - RD will follow-up with diet advancement  NUTRITION DIAGNOSIS:   Inadequate oral intake related to inability to eat as evidenced by NPO status. -ongoing, able to have sips of clears this AM  GOAL:   Patient will meet greater than or equal to 90% of their needs -unable to meet at this time.  MONITOR:   Diet advancement, Weight trends, Labs, Skin, I & O's  ASSESSMENT:   65 y.o. female who had a stercoral perforation of the colon treated by Dr. Dalbert Batman in 2013, treated with Jeanette Caprice and subseqent reversal presented two days ago with abdominal pain and thickening of the colon on CT. Today, she has worsening pain and on CT has perforated her left colon. Informed consent was obtained about probable need for a colostomy and colon resection.   5/31 Pt continues to be NPO but able to have sips of clears this AM. Pt states that she has been sipping on juice all morning and tolerating this without issue; pt reports feeling very thirsty. She states that she continues to have abdominal pain to incision site this AM. Pt reports abdominal pain PTA but is unsure how long pain had been occuring PTA. Pt states denies nausea now or PTA.   Pt states that she lost her husband in 2013 and since that time has had poor appetite and intakes due to lack of desire to eat. She states that she often has to force herself to eat and that she sometimes will only consume 500 kcal/day. Pt reports she has recently been making more of an attempt to increase intakes.  Unable to complete physical assessment this visit as visit interrupted by phone call to pt on hospital phone. Will complete at follow-up and document findings accordingly.   Pt continues to be unable to meet needs at this time. Medications reviewed. IVF: NS-20 mEq KCL @ 100 mL/hr. Labs reviewed; Ca: 7.7 mg/dL, AST/ALT elevated.      5/30 - Pt has been NPO since admission and unable to meet needs.  - POD #1 ex lap with subtotal colectomy with [place] procedure and end colostomy d/y total colonic impaction and splenic flexture rupture.  - Pt sleeping at time of visit and no family/visitors present at bedside at this time.  - RD will follow-up tomorrow to talk with pt as not to awake her at this time.  - Per surgery note this AM, pt is unsure when her last BM was PTA.  - Will complete physical assessment tomorrow but able to visualize some degree of muscle wasting to clavicle area. - Per chart review, pt's weight has fluctuated some over the past 5 months (114-127 lbs).    Diet Order:  Diet NPO time specified Except for: Ice Chips, Other (See Comments)  Skin:  Wound (see comment) (Abdominal incision)  Last BM:  5/31  Height:   Ht Readings from Last 1 Encounters:  11/29/15 5\' 1"  (1.549 m)    Weight:   Wt Readings from Last 1 Encounters:  11/29/15 120 lb (54.432 kg)    Ideal Body Weight:  47.73 kg (kg)  BMI:  Body mass index is 22.69 kg/(m^2).  Estimated Nutritional Needs:   Kcal:  1600-1800  Protein:  70-80 grams  Fluid:  1.6-1.8 L/day  EDUCATION NEEDS:   No education needs identified at this time     Jarome Matin, RD, LDN Inpatient Clinical Dietitian Pager #  [phone] After hours/weekend pager # [phone]

## 2015-12-01 NOTE — Progress Notes (Addendum)
Received report for transfer from [place] in ICU.

## 2015-12-01 NOTE — Clinical Social Work Placement (Signed)
   CLINICAL SOCIAL WORK PLACEMENT  NOTE  Date:  12/01/2015  Patient Details  Name: Stacy Dalton MRN: [id number] Date of Birth: [birth year]  Clinical Social Work is seeking post-discharge placement for this patient at the [place] level of care (*CSW will initial, date and re-position this form in  chart as items are completed):  Yes   Patient/family provided with [facility] Department's list of facilities offering this level of care within the geographic area requested by the patient (or if unable, by the patient's family).  Yes   Patient/family informed of their freedom to choose among providers that offer the needed level of care, that participate in Medicare, Medicaid or managed care program needed by the patient, have an available bed and are willing to accept the patient.  Yes   Patient/family informed of [facility]'s ownership interest in [facility] and [facility], as well as of the fact that they are under no obligation to receive care at these facilities.  PASRR submitted to EDS on 12/01/15     PASRR number received on 12/01/15     Existing PASRR number confirmed on       FL2 transmitted to all facilities in geographic area requested by pt/family on 12/01/15     FL2 transmitted to all facilities within larger geographic area on       Patient informed that his/her managed care company has contracts with or will negotiate with certain facilities, including the following:            Patient/family informed of bed offers received.  Patient chooses bed at       Physician recommends and patient chooses bed at      Patient to be transferred to   on  .  Patient to be transferred to facility by       Patient family notified on   of transfer.  Name of family member notified:        PHYSICIAN Please sign FL2     Additional Comment:    _______________________________________________ Lilly Cove, LCSW 12/01/2015,  11:30 AM

## 2015-12-01 NOTE — Progress Notes (Signed)
LCSW was called by staff to room as patient's daughter in law has arrived and wanted to discuss disposition. Patient and family have decided to pursue ST SNF. First choice would be [facility] or [facility]. Referrals have been faxed out and will follow up with bed offers on 6/1.  Plan:  ST rehab at [facility].   Lane Hacker, MSW Clinical Social Work: [facility] [employer] [phone]

## 2015-12-01 NOTE — Clinical Social Work Note (Signed)
Clinical Social Work Assessment  Patient Details  Name: Stacy Dalton MRN: [id number] Date of Birth: 08-07-1950  Date of referral:  12/01/15               Reason for consult:  Facility Placement, Discharge Planning                Permission sought to share information with:  Case Manager, Customer service manager, Family Supports Permission granted to share information::  Yes, Verbal Permission Granted  Name::        Agency::  None Currently  Relationship::  Daughter in [place] family members per patient (has 5 kids)  Contact Information:     Housing/Transportation Living arrangements for the past 2 months:  Single Family Home Source of Information:  Patient, Medical Team Patient Interpreter Needed:  None Criminal Activity/Legal Involvement Pertinent to Current Situation/Hospitalization:  No - Comment as needed Significant Relationships:  Adult Children, Other Family Members, Neighbor Lives with:  Self Do you feel safe going back to the place where you live?  No (would like information about rehab) Need for family participation in patient care:  Yes (Comment) (pt working with family to see if they will be able to support her if she decides to go home)  Care giving concerns:  Patient reports she lives alone at home in [place].  Has large family of five adult children, friends in her area, and a neighbor who checks on her reguarly or stays with her at night if she needs. Reports she feels she will need more care at discharge with her wound and mobility. She is interested in [facility] SNF, but also wants to see if her family would be willing to stay with her 24/7 and provide care along with home health.     Social Worker assessment / plan:  LCSW received consult of ST SNF.  Met with patient in ICU at the bedside, no family present during assessment. Patient very engaged in assessment and wanting to follow recommendations of MD. 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 She reports she has some she would be willing to go to and some she would rather not.  She reports her husband passed away of cancer 3 years ago and she still misses him everyday and becomes tearful. Reports he was a retired Therapist, sports wit the [employer] and took care of her up until he passed away from cancer. She reports it has been difficulty moving on with life and her family, but feels husband is still watching her and giving her strength.  Plan:  Patient wants to talk with her children and daughter in law to see if they want to stay with her and provide 24/7 care at discharge. If unable or if best option would be SNF, patient is agreeable to SNF at this time.  She is aware of wound care and mobility issues causing weakness and open to rehab for a few weeks. Ideally she would like to be home, but understands if that is too much work or dangerous for her at this time. Will work patient up for SNF. Will hold bed offers until she makes decision to go to SNF.  Employment status:  Retired Forensic scientist:  Medicare PT Recommendations:  Not assessed at this time [place] / Referral to community resources:  [place]  Patient/Family's Response to care:  Agreeable to plan  Patient/Family's Understanding of and Emotional  Response to Diagnosis, Current Treatment, and Prognosis:  Patient voices very normal questions and concerns regarding her treatment plan and after care plan when she is ready for DC. She is open to options and agreeable to follow safest recommendations at DC. She voices some anxiety and fear regarding SNF, but able to process and understand the overall benefit for SNF.  Emotional Assessment Appearance:  Appears stated age Attitude/Demeanor/Rapport:  Other (cooperative, engaged, friendly) Affect (typically observed):  Accepting, Adaptable, Pleasant Orientation:  Oriented to Self, Oriented to Place, Oriented to   Time, Oriented to Situation Alcohol / Substance use:  Not Applicable Psych involvement (Current and /or in the community):  No (Comment)  Discharge Needs  Concerns to be addressed:  Discharge Planning Concerns Readmission within the last 30 days:  No Current discharge risk:  None Barriers to Discharge:  Continued Medical Work up   Lilly Cove, LCSW 12/01/2015, 11:10 AM

## 2015-12-01 NOTE — Progress Notes (Signed)
2 Days Post-Op  Subjective: She looks fine, complaining of pain mostly in her back this AM. Gas and some stool in the ostomy bag.  Objective: Vital signs in last 24 hours: Temp:  [97.6 F (36.4 C)-98.4 F (36.9 C)] 97.6 F (36.4 C) (05/31 0800) Pulse Rate:  [54-87] 61 (05/31 0700) Resp:  [9-20] 9 (05/31 0700) BP: (89-115)/(41-53) 105/49 mmHg (05/31 0700) SpO2:  [92 %-99 %] 97 % (05/31 0804) Last BM Date: 11/28/15 1135 URINE Stool 0 Afebrile, VSS Labs OK WBC improving Intake/Output from previous day: 05/30 0701 - 05/31 0700 In: 2680 [I.V.:2330; IV Piggyback:350] Out: 1135 [Urine:1135] Intake/Output this shift:    General appearance: alert, cooperative and no distress Resp: clear to auscultation bilaterally GI: soft, few BS, flatus and some stool in the ostomy bag this Am.    Lab Results:   Recent Labs  11/30/15 0305 12/01/15 0311  WBC 18.1* 11.3*  HGB 12.4 8.5*  HCT 35.8* 25.2*  PLT 397 291    BMET  Recent Labs  11/30/15 0305 12/01/15 0311  NA 134* 137  K 3.9 4.6  CL 103 108  CO2 24 25  GLUCOSE 278* 136*  BUN 9 16  CREATININE 0.94 0.78  CALCIUM 7.6* 7.7*   PT/INR No results for input(s): LABPROT, INR in the last 72 hours.   Recent Labs Lab 11/24/15 2212 11/29/15 1253  AST 20 85*  ALT 14 108*  ALKPHOS 77 88  BILITOT 0.6 0.4  PROT 7.2 6.3*  ALBUMIN 3.8 3.2*     Lipase     Component Value Date/Time   LIPASE 22 11/24/2015 2212     Studies/Results: Ct Abdomen Pelvis Wo Contrast  11/29/2015  CLINICAL DATA:  Severe abdominal pain EXAM: CT ABDOMEN AND PELVIS WITHOUT CONTRAST TECHNIQUE: Multidetector CT imaging of the abdomen and pelvis was performed following the standard protocol without IV contrast. COMPARISON:  11/25/2015 FINDINGS: Lung bases are free of acute infiltrate or sizable effusion. The gallbladder is distended. No definitive cholelithiasis is seen. The liver, spleen,, adrenal glands, pancreas and kidneys are within normal limits.  The appendix is within normal limits. The bladder is well distended. There are changes consistent with free intraperitoneal air likely related to the known thickened colonic wall an inflammatory changes in the descending colon. These are stable in appearance from the prior exam. The uterus has been surgically removed. No pelvic mass lesion is noted. The appendix is within normal limits. The osseous structures show degenerative change of the lumbar spine. IMPRESSION: Persistent thickening of the colonic wall in the descending and sigmoid colon with pericolonic inflammatory change. There are now noted changes of significant perforation with free intraperitoneal air. A large amount of air is noted in the region of the colonic wall thickening in this likely corresponds to the perforation site. Critical Value/emergent results were called by telephone at the time of interpretation on 11/29/2015 at 2:41 pm to [facility] , who verbally acknowledged these results. Electronically Signed   By: Inez Catalina M.D.   On: 11/29/2015 14:47    Medications: . heparin subcutaneous  5,000 Units Subcutaneous Q8H  . HYDROmorphone   Intravenous Q4H  . levothyroxine  25 mcg Intravenous Daily  . mometasone-formoterol  2 puff Inhalation BID  . 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 S/p stercoral perforation with left colectomy 10/04/11, Dr. Dalbert Batman S/p Subtotal colectomy, Jeanette Caprice procedure  with end ascending colostomy, 11/30/15, Dr. Johnathan Hausen - POD 1 Dehydration with elevated creatinine - resolving Hx of COPD/hx of tobacco use Hx of Migraines Chronic back pain on chronic pain meds Hypothyroid Hx of anxiety and depression GERD FEN: IV fluids/NPO ID: Zosyn day 3 DVT: Heparin/SCD    Plan:  DC foley,  transfer to floor,  Mobilize, I will let her have sips and chips this AM.   Continue PCA and IV tylenol today.  LOS: 2 days    Charlsie Fleeger 12/01/2015 [phone]

## 2015-12-01 NOTE — Progress Notes (Addendum)
Stacy Dalton is a 65 y.o. female patient admitted from ICU awake, alert - oriented  X 4 - no acute distress noted.  VSS -  Blood pressure 115/74, pulse 68, temperature 98.3, temperature source Oral, resp. rate 16. SpO2 94% on RA.  IVs in place, occlusive dsg intact without redness.  Orientation to room, and floor completed with information packet given to patient/family.  Admission INP armband ID verified with patient/family, and in place.   SR up x 2, fall assessment complete, with patient and family able to verbalize understanding of risk associated with falls, and verbalized understanding to call nsg before up out of bed.  Call light within reach, patient able to voice, and demonstrate understanding.  Skin, clean-dry- intact without evidence of bruising, or skin tears.   No evidence of skin break down noted on exam.     Will cont to eval and treat per MD orders.  Marcy Salvo, RN 12/01/2015 1:46 PM

## 2015-12-02 LAB — CBC
HCT: 26.7 % — ABNORMAL LOW (ref 36.0–46.0)
Hemoglobin: 8.8 g/dL — ABNORMAL LOW (ref 12.0–15.0)
MCH: 28.9 pg (ref 26.0–34.0)
MCHC: 33 g/dL (ref 30.0–36.0)
MCV: 87.8 fL (ref 78.0–100.0)
Platelets: 357 10*3/uL (ref 150–400)
RBC: 3.04 MIL/uL — ABNORMAL LOW (ref 3.87–5.11)
RDW: 14.2 % (ref 11.5–15.5)
WBC: 12.1 10*3/uL — ABNORMAL HIGH (ref 4.0–10.5)

## 2015-12-02 MED ORDER — METHOCARBAMOL 500 MG PO TABS
1000.0000 mg | ORAL_TABLET | Freq: Three times a day (TID) | ORAL | Status: DC | PRN
  Administered 2015-12-03 – 2015-12-04 (×2): 1000 mg via ORAL
  Filled 2015-12-02 (×2): qty 2

## 2015-12-02 MED ORDER — TRAMADOL HCL 50 MG PO TABS
50.0000 mg | ORAL_TABLET | Freq: Four times a day (QID) | ORAL | Status: DC | PRN
  Administered 2015-12-03 – 2015-12-04 (×2): 50 mg via ORAL
  Filled 2015-12-02 (×3): qty 1

## 2015-12-02 MED ORDER — VITAMIN D (ERGOCALCIFEROL) 1.25 MG (50000 UNIT) PO CAPS: 50000.0000 [IU] | ORAL_CAPSULE | ORAL | Status: DC

## 2015-12-02 MED ORDER — ACETAMINOPHEN 325 MG PO TABS
325.0000 mg | ORAL_TABLET | Freq: Four times a day (QID) | ORAL | Status: DC | PRN
  Administered 2015-12-02: 650 mg via ORAL
  Filled 2015-12-02: qty 2

## 2015-12-02 MED ORDER — BOOST / RESOURCE BREEZE PO LIQD
1.0000 | Freq: Three times a day (TID) | ORAL | Status: DC
  Administered 2015-12-02: 1 via ORAL

## 2015-12-02 MED ORDER — ALPRAZOLAM 0.5 MG PO TABS
0.5000 mg | ORAL_TABLET | Freq: Every day | ORAL | Status: DC | PRN
  Administered 2015-12-02: 0.5 mg via ORAL
  Filled 2015-12-02: qty 1

## 2015-12-02 MED ORDER — DULOXETINE HCL 60 MG PO CPEP
60.0000 mg | ORAL_CAPSULE | Freq: Every day | ORAL | Status: DC
  Administered 2015-12-02 – 2015-12-04 (×3): 60 mg via ORAL
  Filled 2015-12-02 (×3): qty 1

## 2015-12-02 MED ORDER — ZOLPIDEM TARTRATE 5 MG PO TABS: 2.5000 mg | ORAL_TABLET | Freq: Every evening | ORAL | Status: DC | PRN

## 2015-12-02 MED ORDER — LEVOTHYROXINE SODIUM 50 MCG PO TABS
50.0000 ug | ORAL_TABLET | Freq: Every day | ORAL | Status: DC
  Administered 2015-12-02 – 2015-12-04 (×3): 50 ug via ORAL
  Filled 2015-12-02 (×4): qty 1

## 2015-12-02 NOTE — Care Management Important Message (Signed)
Important Message  Patient Details  Name: Stacy Dalton MRN: [id number] Date of Birth: 1950/10/09   Medicare Important Message Given:  Yes    [name] 12/02/2015, 10:16 AMImportant Message  Patient Details  Name: Stacy Dalton MRN: [id number] Date of Birth: 1951-03-22   Medicare Important Message Given:  Yes    [name] 12/02/2015, 10:16 AM

## 2015-12-02 NOTE — Progress Notes (Signed)
[facility] Progress Note  3 Days Post-Op  Subjective: Pt doing better, pain well controlled.  Hasn't been up OOB much.  No N/V, tolerating sips of clears, getting an appetite back.  Ostomy with stool and flatus output.  Hears stomach grumbling.  C/o back pain, would like a heating pad.   Objective: Vital signs in last 24 hours: Temp:  [97.6 F (36.4 C)-98.4 F (36.9 C)] 98.2 F (36.8 C) (06/01 0556) Pulse Rate:  [59-77] 72 (06/01 0556) Resp:  [11-22] 20 (06/01 0556) BP: (104-132)/(47-74) 119/60 mmHg (06/01 0556) SpO2:  [92 %-98 %] 97 % (06/01 0556) Last BM Date: 12/01/15  Intake/Output from previous day: 05/31 0701 - 06/01 0700 In: 2596.8 [P.O.:480; I.V.:2016.8; IV Piggyback:100] Out: 395 [Urine:120; Stool:275] Intake/Output this shift:    PE: Gen:  Alert, NAD, pleasant Card:  RRR, no M/G/R heard Pulm:  CTA, no W/R/R, IS up to 1100 Abd: Soft, mild distension, mild tenderness, +BS, no HSM, midline wound C/D/I, ostomy pink and patent, edematous, with stool and flatus output Ext:  No erythema, edema, or tenderness   Lab Results:   Recent Labs  12/01/15 1115 12/02/15 0403  WBC 11.9* 12.1*  HGB 8.6* 8.8*  HCT 25.4* 26.7*  PLT 304 357   BMET  Recent Labs  11/30/15 0305 12/01/15 0311  NA 134* 137  K 3.9 4.6  CL 103 108  CO2 24 25  GLUCOSE 278* 136*  BUN 9 16  CREATININE 0.94 0.78  CALCIUM 7.6* 7.7*   PT/INR No results for input(s): LABPROT, INR in the last 72 hours. CMP     Component Value Date/Time   NA 137 12/01/2015 0311   NA 138 08/17/2015 1156   K 4.6 12/01/2015 0311   CL 108 12/01/2015 0311   CO2 25 12/01/2015 0311   GLUCOSE 136* 12/01/2015 0311   GLUCOSE 83 08/17/2015 1156   BUN 16 12/01/2015 0311   BUN 18 08/17/2015 1156   CREATININE 0.78 12/01/2015 0311   CREATININE 0.79 11/06/2012 0851   CALCIUM 7.7* 12/01/2015 0311   PROT 6.3* 11/29/2015 1253   PROT 7.3 08/17/2015 1156   ALBUMIN 3.2* 11/29/2015 1253   ALBUMIN 4.7  08/17/2015 1156   AST 85* 11/29/2015 1253   ALT 108* 11/29/2015 1253   ALKPHOS 88 11/29/2015 1253   BILITOT 0.4 11/29/2015 1253   BILITOT 0.2 08/17/2015 1156   GFRNONAA >60 12/01/2015 0311   GFRAA >60 12/01/2015 0311   Lipase     Component Value Date/Time   LIPASE 22 11/24/2015 2212       Studies/Results: No results found.  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  POD #3 S/p Subtotal colectomy, Hartmann procedure with end ascending colostomy, 11/30/15, Dr. Hassell Done -Start clears, red. IVF -Encouraged oral pain meds, plan to d/c PCA tomorrow -  Add robaxin, ultram, and heat to back -Mobilize and IS -Resumed home meds except BP meds (may need to resume these soon) H/o stercoral perforation with left colectomy 10/04/11, Dr. Dalbert Batman Dehydration with elevated creatinine - resolving Hx of COPD/hx of tobacco use Hx of Migraines Hx of anxiety and depression HTN Chronic back pain on chronic pain meds Hypothyroid GERD FEN: IV fluids/NPO ID: Zosyn day #4/7, WBC 12.1, monitor DVT: Heparin/SCD Disp: On floor, slowly advancing diet, pain control    LOS: 3 days    Stacy Dalton 12/02/2015, 7:12 AM Pager: [phone]  (7am - 4:30pm M-F; 7am - 11:30am Sa/Su)

## 2015-12-02 NOTE — Consult Note (Signed)
WOC ostomy follow up Stoma type/location: RLQ colostomy Stomal assessment/size: 1 and 3/4 x 2 inches oval, red, moist, edematous Peristomal assessment: clear Treatment options for stomal/peristomal skin: skin barrier ring Output: liquid green effluent.  300 mls in old pouch  Ostomy pouching: 2pc. 2 and 3/4 inch pouching system with skin barrier ring Education provided: Patient is provided with ostomy teaching booklet and reminded that she performed Lock and ROll closure prior to discharge with her last ostomy.  I appreciate that she cannot change the pouching system, but as of today she is unwilling to practice emptying the pouch. Supplies at bedside. Enrolled patient in [facility] Start Discharge program: No. Patient is planning on going to SNF following hospitalization as she has no help at home and is not able or willing to participate in ostomy care at this time. [facility] nursing team will follow, and will remain available to this patient, the nursing, surgical and medical teams.   Thanks, Maudie Flakes, MSN, RN, [place], Arther Abbott  Pager# [phone]

## 2015-12-02 NOTE — Progress Notes (Signed)
Nutrition Follow-up  DOCUMENTATION CODES:   Not applicable  INTERVENTION:  -Boost Breeze po TID, each supplement provides 250 kcal and 9 grams of protein -RD to continue to monitor   NUTRITION DIAGNOSIS:   Inadequate oral intake related to inability to eat as evidenced by NPO status. -Resolving with diet advancement  GOAL:   Patient will meet greater than or equal to 90% of their needs -Not meeting with Clear Liquid Diet  MONITOR:   Diet advancement, Weight trends, Labs, Skin, I & O's  REASON FOR ASSESSMENT:   Malnutrition Screening Tool    ASSESSMENT:   65 y.o. female who had a stercoral perforation of the colon treated by Dr. Dalbert Dalton in 2013, treated with Stacy Dalton and subseqent reversal presented two days ago with abdominal pain and thickening of the colon on CT. Today, she has worsening pain and on CT has perforated her left colon. Informed consent was obtained about probable need for a colostomy and colon resection.   Spoke with Stacy Dalton at bedside. She is doing ok, PO intake per her is good. She had a clear liquid tray that "Is great!" this morning and for lunch. She also asked me to get her a grape juice, which I did, during this visit. She has seen an increase in pain, but has been getting up and walking around to try and work through it and "get back on my feet." Endorses some nausea but has been relieved by medication via Therapist, sports.  Nutrition-Focused physical exam completed. Findings are no fat depletion, mild muscle depletion, and no edema.   Ok NFPE-wise  Labs and medications reviewed: Ca 7.7 Vitamin D 50000iu Q7; NaCL KCL 20 mEQ 14mL/hr  Diet Order:  Diet clear liquid Room service appropriate?: Yes; Fluid consistency:: Thin  Skin:  Wound (see comment) (Abdominal incision)  Last BM:  5/31  Height:   Ht Readings from Last 1 Encounters:  11/29/15 5\' 1"  (1.549 m)    Weight:   Wt Readings from Last 1 Encounters:  11/29/15 120 lb (54.432 kg)     Ideal Body Weight:  47.73 kg (kg)  BMI:  Body mass index is 22.69 kg/(m^2).  Estimated Nutritional Needs:   Kcal:  1600-1800  Protein:  70-80 grams  Fluid:  1.6-1.8 L/day  EDUCATION NEEDS:   No education needs identified at this time  Stacy Dalton. Stacy Nevitt, MS, RD LDN Inpatient Clinical Dietitian Pager [phone]

## 2015-12-02 NOTE — Progress Notes (Signed)
OT Cancellation Note  Patient Details Name: Stacy Dalton MRN: [id number] DOB: 1950-11-14   Cancelled Treatment:    Reason Eval/Treat Not Completed: Other (comment) -- Note plan is for short-term SNF. Will defer OT needs to SNF. Thank you.  Kemo Spruce A 12/02/2015, 9:13 AM

## 2015-12-03 DIAGNOSIS — K5909 Other constipation: Secondary | ICD-10-CM | POA: Diagnosis present

## 2015-12-03 LAB — CBC
HCT: 27.3 % — ABNORMAL LOW (ref 36.0–46.0)
Hemoglobin: 9 g/dL — ABNORMAL LOW (ref 12.0–15.0)
MCH: 28.6 pg (ref 26.0–34.0)
MCHC: 33 g/dL (ref 30.0–36.0)
MCV: 86.7 fL (ref 78.0–100.0)
Platelets: 412 10*3/uL — ABNORMAL HIGH (ref 150–400)
RBC: 3.15 MIL/uL — ABNORMAL LOW (ref 3.87–5.11)
RDW: 13.9 % (ref 11.5–15.5)
WBC: 11.9 10*3/uL — ABNORMAL HIGH (ref 4.0–10.5)

## 2015-12-03 MED ORDER — TRAMADOL HCL 50 MG PO TABS: 50.0000 mg | ORAL_TABLET | Freq: Four times a day (QID) | ORAL | Status: DC | PRN

## 2015-12-03 MED ORDER — DOCUSATE SODIUM 100 MG PO CAPS
100.0000 mg | ORAL_CAPSULE | Freq: Two times a day (BID) | ORAL | Status: DC | PRN
Start: 2015-12-03 — End: 2018-03-07

## 2015-12-03 MED ORDER — TRIAMTERENE-HCTZ 37.5-25 MG PO CAPS
1.0000 | ORAL_CAPSULE | Freq: Every day | ORAL | Status: DC
  Administered 2015-12-03 – 2015-12-04 (×2): 1 via ORAL
  Filled 2015-12-03 (×2): qty 1

## 2015-12-03 MED ORDER — HYDROMORPHONE HCL 1 MG/ML IJ SOLN
0.5000 mg | Freq: Four times a day (QID) | INTRAMUSCULAR | Status: DC | PRN
  Administered 2015-12-03: 0.5 mg via INTRAVENOUS
  Filled 2015-12-03: qty 1

## 2015-12-03 MED ORDER — AMLODIPINE-OLMESARTAN 10-40 MG PO TABS: 1.0000 | ORAL_TABLET | Freq: Every day | ORAL | Status: DC

## 2015-12-03 MED ORDER — POLYETHYLENE GLYCOL 3350 17 G PO PACK: 17.0000 g | PACK | Freq: Every day | ORAL | Status: DC | PRN

## 2015-12-03 MED ORDER — IRBESARTAN 300 MG PO TABS
300.0000 mg | ORAL_TABLET | Freq: Every day | ORAL | Status: DC
  Administered 2015-12-03 – 2015-12-04 (×2): 300 mg via ORAL
  Filled 2015-12-03 (×2): qty 1

## 2015-12-03 MED ORDER — METHOCARBAMOL 500 MG PO TABS: 1000.0000 mg | ORAL_TABLET | Freq: Three times a day (TID) | ORAL | Status: DC | PRN

## 2015-12-03 MED ORDER — AMLODIPINE BESYLATE 10 MG PO TABS
10.0000 mg | ORAL_TABLET | Freq: Every day | ORAL | Status: DC
  Administered 2015-12-03 – 2015-12-04 (×2): 10 mg via ORAL
  Filled 2015-12-03 (×2): qty 1

## 2015-12-03 MED ORDER — HYDROMORPHONE HCL 1 MG/ML IJ SOLN
0.5000 mg | INTRAMUSCULAR | Status: DC | PRN
  Administered 2015-12-03 (×2): 0.5 mg via INTRAVENOUS
  Filled 2015-12-03 (×2): qty 1

## 2015-12-03 MED ORDER — AMOXICILLIN-POT CLAVULANATE 875-125 MG PO TABS: 1.0000 | ORAL_TABLET | Freq: Two times a day (BID) | ORAL | Status: AC

## 2015-12-03 MED ORDER — ACETAMINOPHEN 325 MG PO TABS: 325.0000 mg | ORAL_TABLET | Freq: Four times a day (QID) | ORAL | Status: DC | PRN

## 2015-12-03 NOTE — Progress Notes (Addendum)
CSW assisting with d/c planning. SNF bed offers provided to pt this am. She has chosen [facility] for ST Re[facility]. SNF is able to accept pt tomorrow. Week end CSW ( [phone] ) will assist with d/c planning needs.  If discharging this weekend, must have DC summary completed by 10am to DC to ST SNF [facility]. Wound care notes sent to facility as requested.  Werner Lean LCSW [phone]

## 2015-12-03 NOTE — Progress Notes (Signed)
[facility] Progress Note  4 Days Post-Op  Subjective: Pt has pain, probably from cramping now that bowels are working well.  No N/V, tolerating clears, but appetite is down.  Ambulating well OOB.  Good ostomy output/flatus.     Objective: Vital signs in last 24 hours: Temp:  [98.5 F (36.9 C)-99.8 F (37.7 C)] 99.6 F (37.6 C) (06/02 0609) Pulse Rate:  [75-90] 90 (06/02 0609) Resp:  [11-18] 12 (06/02 0609) BP: (113-140)/(45-76) 138/68 mmHg (06/02 0609) SpO2:  [94 %-98 %] 96 % (06/02 0609) Last BM Date: 12/01/15  Intake/Output from previous day: 06/01 0701 - 06/02 0700 In: 587.5 [I.V.:587.5] Out: 2925 [Urine:2450; Stool:475] Intake/Output this shift:    PE: Gen: Alert, NAD, pleasant Card: RRR, no M/G/R heard Pulm: CTA, no W/R/R, IS up to 1250 Abd: Soft, mild distension, mild tenderness, +BS, no HSM, midline wound C/D/I, ostomy pink and patent, edematous, with stool and flatus output   Lab Results:   Recent Labs  12/02/15 0403 12/03/15 0357  WBC 12.1* 11.9*  HGB 8.8* 9.0*  HCT 26.7* 27.3*  PLT 357 412*   BMET  Recent Labs  12/01/15 0311  NA 137  K 4.6  CL 108  CO2 25  GLUCOSE 136*  BUN 16  CREATININE 0.78  CALCIUM 7.7*   PT/INR No results for input(s): LABPROT, INR in the last 72 hours. CMP     Component Value Date/Time   NA 137 12/01/2015 0311   NA 138 08/17/2015 1156   K 4.6 12/01/2015 0311   CL 108 12/01/2015 0311   CO2 25 12/01/2015 0311   GLUCOSE 136* 12/01/2015 0311   GLUCOSE 83 08/17/2015 1156   BUN 16 12/01/2015 0311   BUN 18 08/17/2015 1156   CREATININE 0.78 12/01/2015 0311   CREATININE 0.79 11/06/2012 0851   CALCIUM 7.7* 12/01/2015 0311   PROT 6.3* 11/29/2015 1253   PROT 7.3 08/17/2015 1156   ALBUMIN 3.2* 11/29/2015 1253   ALBUMIN 4.7 08/17/2015 1156   AST 85* 11/29/2015 1253   ALT 108* 11/29/2015 1253   ALKPHOS 88 11/29/2015 1253   BILITOT 0.4 11/29/2015 1253   BILITOT 0.2 08/17/2015 1156   GFRNONAA >60  12/01/2015 0311   GFRAA >60 12/01/2015 0311   Lipase     Component Value Date/Time   LIPASE 22 11/24/2015 2212       Studies/Results: No results found.  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  POD #4 S/p Subtotal colectomy, Hartmann procedure with end ascending colostomy, 11/30/15, Dr. Hassell Done -Start fulls, soft at dinner if tolerating, red. IVF -Encouraged oral pain meds, d/c PCA, start breakthrough IVP -Robaxin, ultram, heat to back, ice to incision -Mobilize and IS -Resumed home meds and BP meds H/o stercoral perforation with left colectomy 10/04/11, Dr. Dalbert Batman Hx of COPD/hx of  tobacco use Hx of Migraines Hx of anxiety and depression HTN Chronic back pain on chronic pain meds Hypothyroid GERD FEN: fulls --> soft at dinner ID: Zosyn day #5/7, WBC 11.9, monitor DVT: Heparin/SCD Disp: On floor, slowly advancing diet, pain control, possible d/c to SNF tomorrow if SNF able to take patients on weekends.  F/u with Dr. Hassell Done in 2-3 weeks.    LOS: 4 days    Stacy Dalton 12/03/2015, 7:22 AM Pager: [phone]  (7am - 4:30pm M-F; 7am - 11:30am Sa/Su)

## 2015-12-03 NOTE — Discharge Instructions (Signed)
[facility]      [facility] [phone]  OPEN ABDOMINAL SURGERY: POST OP INSTRUCTIONS  Always review your discharge instruction sheet given to you by the facility where your surgery was performed.  IF YOU HAVE DISABILITY OR FAMILY LEAVE FORMS, YOU MUST BRING THEM TO THE OFFICE FOR PROCESSING.  PLEASE DO NOT GIVE THEM TO YOUR DOCTOR.  1. A prescription for pain medication may be given to you upon discharge.  Take your pain medication as prescribed, if needed.  If narcotic pain medicine is not needed, then you may take acetaminophen (Tylenol) or ibuprofen (Advil) as needed. 2. Take your usually prescribed medications unless otherwise directed. 3. If you need a refill on your pain medication, please contact your pharmacy. They will contact our office to request authorization.  Prescriptions will not be filled after 5pm or on week-ends. 4. You should follow a light diet the first few days after arrival home, such as soup and crackers, pudding, etc.unless your doctor has advised otherwise. A high-fiber, low fat diet can be resumed as tolerated.   Be sure to include lots of fluids daily. Most patients will experience some swelling and bruising on the chest and neck area.  Ice packs will help.  Swelling and bruising can take several days to resolve 5. Most patients will experience some swelling and bruising in the area of the incision. Ice pack will help. Swelling and bruising can take several days to resolve..  6. It is common to experience some constipation if taking pain medication after surgery.  Increasing fluid intake and taking a stool softener will usually help or prevent this problem from occurring.  A mild laxative (Milk of Magnesia or Miralax) should be taken according to package directions if there are no bowel movements after 48 hours. 7.  You may have steri-strips (small skin tapes) in place directly over the incision.  These strips should be left on the skin for 7-10 days.  If your  surgeon used skin glue on the incision, you may shower in 24 hours.  The glue will flake off over the next 2-3 weeks.  Any sutures or staples will be removed at the office during your follow-up visit. You may find that a light gauze bandage over your incision may keep your staples from being rubbed or pulled. You may shower and replace the bandage daily. 8. ACTIVITIES:  You may resume regular (light) daily activities beginning the next day--such as daily self-care, walking, climbing stairs--gradually increasing activities as tolerated.  You may have sexual intercourse when it is comfortable.  Refrain from any heavy lifting or straining until approved by your doctor. a. You may drive when you no longer are taking prescription pain medication, you can comfortably wear a seatbelt, and you can safely maneuver your car and apply brakes b. Return to Work: ___________________________________ 9. You should see your doctor in the office for a follow-up appointment approximately two weeks after your surgery.  Make sure that you call for this appointment within a day or two after you arrive home to insure a convenient appointment time. OTHER INSTRUCTIONS:  _____________________________________________________________ _____________________________________________________________  WHEN TO CALL YOUR DOCTOR: 1. Fever over 101.0 2. Inability to urinate 3. Nausea and/or vomiting 4. Extreme swelling or bruising 5. Continued bleeding from incision. 6. Increased pain, redness, or drainage from the incision. 7. Difficulty swallowing or breathing 8. Muscle cramping or spasms. 9. Numbness or tingling in hands or feet or around lips.  The clinic staff is available to   answer your questions during regular business hours.  Please don't hesitate to call and ask to speak to one of the nurses if you have concerns.  For further questions, please visit [email or web address]   

## 2015-12-03 NOTE — Consult Note (Signed)
WOC ostomy follow up Stoma type/location: RLQ colostomy Stomal assessment/size: 1 and 3/4 inches x 2 and 1/8 inches oval, moist, edematous. Peristomal assessment: not seen today; pouching system applied yesterday is intact Treatment options for stomal/peristomal skin: skin barrier ring  Output: liquid dark effluent Ostomy pouching: 2pc. 2 and 3/4 inch pouching system Education provided: None today.  Patient is finally comfortable after a long night.  She is happy about going to SNF at this time and pleased that [facility] accepted her as she used to work there. Enrolled patient in [place] Start Discharge program: No MSW asked me to place Hollister Gaffer) numbers on the chart for the obtaining of supplies by [facility]. The three products required for pouching are:  1. Pouch is Hollister # [id number] 2. Skin barrier (flat, extended wear) is Hollister # [id number] 3. Skin barrier ring ("washer"-type ring stretched to stoma size and placed around the stoma prior to pouching)is Hollister # [id number]  Pouching system is to be changed twice weekly.  Pouching supplies for 3-4 changes are at the bedside along with a teaching booklet for Colostomies and a 1-page tip sheet for the pouching system change procedure.  [facility] nursing team will follow, and will remain available to this patient, the nursing, surgical and medical teams.   Thanks, Maudie Flakes, MSN, RN, [place], Arther Abbott  Pager# [phone]

## 2015-12-03 NOTE — Discharge Summary (Signed)
[facility] Discharge Summary   Patient ID: Stacy Dalton MRN: [id number] DOB/AGE: 04-Jun-1951 65 y.o.  Admit date: 11/29/2015 Discharge date: 12/03/2015  Admitting Diagnosis: Peritonitis with abscess of intestine Leukocytosis Chronic constipation  Discharge Diagnosis Patient Active Problem List   Diagnosis Date Noted  . Chronic constipation 12/03/2015  . Peritonitis with abscess of intestine ([place]) 11/29/2015  . COPD (chronic obstructive pulmonary disease) ([place]) 09/20/2015  . Osteopenia 06/10/2014  . Vitamin D deficiency disease 06/10/2014  . Hypothyroidism   . Depression 11/18/2012  . Insomnia 11/18/2012  . Chronic pain syndrome 11/18/2012  . HLD (hyperlipidemia) 09/01/2012  . GAD (generalized anxiety disorder) 09/01/2012    Class: Chronic  . S/P colostomy ([place]) 04/19/2012  . Normocytic anemia 10/15/2011  . Hypertension 10/13/2011  . Chronic back pain 10/13/2011    Consultants None  Imaging: No results found.  Procedures Dr. Hassell Done (11/29/15) - Subtotal colectomy, Hartmann procedure with end ascending colostomy.  Hospital Course:  65 y/o white female who had a stercoral perforation of the colon treated by Dr. Dalbert Batman in 2013, treated with Jeanette Caprice and [place] reversal presented two days ago with abdominal pain and thickening of the colon on CT. Today, she has worsening pain and on CT has perforated her left colon. She was taken urgent to the OR for an Exploratory laparotomy.    Patient was admitted and underwent procedure listed above.  She was found to have Stercoral perforation of the splenic flexure with a 10-cm defect in the colon. Tolerated procedure well and was transferred to the SDU for close monitoring.  She had a mild post-operative ileus which resolved.  Diet was advanced as tolerated.  A small midline wound will have dressing changes once daily WD until healed.  Ostomy is edematous, but should lessen over the next few weeks.  Ostomy care  will be provided at the SNF.  It was difficult to wean her off of narcotics due to her underlying chronic pain.  She is urged to avoid narcotic use secondary to her severe chronic constipation which led to her second colon perforation this admission.  We discussed a good bowel regimen.  On POD #5, the patient was voiding well, tolerating diet, ambulating well, pain well controlled, vital signs stable, incisions c/d/i and felt stable for discharge home.  Patient will follow up in our office in 2-3 weeks and knows to call with questions or concerns.  She will call to confirm appointment date/time.  She will need to finish 2 more days of antibiotics (Augmentin).   Physical Exam: HEENT - clear Chest - clear bilat, no wheeze Cor - RRR Abd - midline dressing dry and intact; few BS present; soft without distension; liquid stool in ostomy bag  Dressing changes:  WD daily to midline wound Diet:  Low residue/soft diet Bowel regimen:  Colace BID, miralax daily prn.  Goal should be to have at least one BM in bag every day.     Medication List    STOP taking these medications        ciprofloxacin 500 MG tablet  Commonly known as:  CIPRO     HYDROcodone-acetaminophen 5-325 MG tablet  Commonly known as:  NORCO/VICODIN     HYDROcodone-homatropine 5-1.5 MG/5ML syrup  Commonly known as:  HYCODAN     metroNIDAZOLE 500 MG tablet  Commonly known as:  FLAGYL      TAKE these medications        acetaminophen 325 MG tablet  Commonly known as:  TYLENOL  Take 1-2 tablets (325-650 mg total) by mouth every 6 (six) hours as needed for fever, headache, mild pain or moderate pain.     ALPRAZolam 0.5 MG tablet  Commonly known as:  XANAX  Take 1 tablet (0.5 mg total) by mouth daily as needed for anxiety.     amoxicillin-clavulanate 875-125 MG tablet  Commonly known as:  AUGMENTIN  Take 1 tablet by mouth 2 (two) times daily.  Start taking on:  12/04/2015     atorvastatin 40 MG tablet  Commonly known as:   LIPITOR  Take 40 mg by mouth daily.     AZOR 10-40 MG tablet  Generic drug:  amLODipine-olmesartan  TAKE 1 TABLET DAILY     COMBIVENT RESPIMAT 20-100 MCG/ACT Aers respimat  Generic drug:  Ipratropium-Albuterol  Inhale 1 puff into the lungs every 6 (six) hours as needed for wheezing.     docusate sodium 100 MG capsule  Commonly known as:  COLACE  Take 1 capsule (100 mg total) by mouth 2 (two) times daily as needed for mild constipation.     DULoxetine 60 MG capsule  Commonly known as:  CYMBALTA  Take 1 capsule (60 mg total) by mouth daily.     fluticasone 50 MCG/ACT nasal spray  Commonly known as:  FLONASE  Place 2 sprays into both nostrils daily.     Fluticasone-Salmeterol 100-50 MCG/DOSE Aepb  Commonly known as:  ADVAIR DISKUS  Inhale 1 puff into the lungs 2 (two) times daily.     levothyroxine 50 MCG tablet  Commonly known as:  SYNTHROID, LEVOTHROID  TAKE 1 TABLET DAILY     methocarbamol 500 MG tablet  Commonly known as:  ROBAXIN  Take 2 tablets (1,000 mg total) by mouth every 8 (eight) hours as needed for muscle spasms.     ondansetron 4 MG tablet  Commonly known as:  ZOFRAN  Take 1 tablet (4 mg total) by mouth every 8 (eight) hours as needed for nausea or vomiting.     traMADol 50 MG tablet  Commonly known as:  ULTRAM  Take 1 tablet (50 mg total) by mouth every 6 (six) hours as needed for moderate pain or severe pain.     triamterene-hydrochlorothiazide 37.5-25 MG capsule  Commonly known as:  DYAZIDE  TAKE 1 CAPSULE DAILY     Vitamin D (Ergocalciferol) 50000 units Caps capsule  Commonly known as:  DRISDOL  TAKE 1 CAPSULE EVERY 7 DAYS     zolpidem 12.5 MG CR tablet  Commonly known as:  AMBIEN CR  TAKE 1 TABLET DAILY AT BEDTIME AS NEEDED         Follow-up Information    Follow up with Johnathan Hausen B, MD. Schedule an appointment as soon as possible for a visit in 2 weeks.   Specialty:  General Surgery   Why:  For post-operation check.  Call to verify  appointment date/time.   Contact information:   [place] [address] [phone]       Signed: Nat Christen, [facility] [phone]  12/03/2015, 4:55 PM

## 2015-12-03 NOTE — Progress Notes (Signed)
Physical Therapy Treatment Patient Details Name: Stacy Dalton MRN: [id number] DOB: 03/18/1951 Today's Date: 12-13-2015    History of Present Illness S/p Subtotal colectomy, Hartmann procedure with end ascending colostomy, 11/30/15 for recurrent colon perforation    PT Comments    Pt progressing although still fatigues quickly and with higher level balance deficits and will benefit from STSNF postacute; will continue to follow  Follow Up Recommendations  SNF;Supervision/Assistance - 24 hour     Equipment Recommendations  None recommended by PT    Recommendations for Other Services       Precautions / Restrictions Precautions Precaution Comments: colostomy Restrictions Weight Bearing Restrictions: No    Mobility  Bed Mobility Overal bed mobility: Needs Assistance Bed Mobility: Rolling;Sidelying to Sit;Sit to Sidelying Rolling: Min guard Sidelying to sit: Min assist;HOB elevated     Sit to sidelying: Min assist General bed mobility comments: cues for log roll, assist with trunk, assist with legs.  Transfers Overall transfer level: Needs assistance Equipment used: Rolling walker (2 wheeled) Transfers: Sit to/from Stand Sit to Stand: Min guard;Supervision         General transfer comment: cues for hand placement  Ambulation/Gait Ambulation/Gait assistance: Min guard;Min assist Ambulation Distance (Feet): 380 Feet (15') Assistive device: Rolling walker (2 wheeled) Gait Pattern/deviations: Step-through pattern;Decreased stride length     General Gait Details: cues for RW safety, breathing, posture; occaisonal assist for RW/balance; pt fatigues quickly; incr WOB, all VSS   Stairs            Wheelchair Mobility    Modified Rankin (Stroke Patients Only)       Balance                                    Cognition Arousal/Alertness: Awake/alert Behavior During Therapy: WFL for tasks assessed/performed Overall Cognitive Status:  Within Functional Limits for tasks assessed                      Exercises General Exercises - Lower Extremity Heel Raises: AROM;Strengthening;Both;10 reps;Standing Other Exercises Other Exercises: hamstring curls, bil x10 standing    General Comments        Pertinent Vitals/Pain Pain Assessment: 0-10 Pain Score: 5  Pain Location: abdomen Pain Descriptors / Indicators: Sore Pain Intervention(s): Limited activity within patient's tolerance;Monitored during session;Premedicated before session;Repositioned    Home Living                      Prior Function            PT Goals (current goals can now be found in the care plan section) Acute Rehab PT Goals Patient Stated Goal: to go to rehab PT Goal Formulation: With patient Time For Goal Achievement: 12/15/15 Potential to Achieve Goals: Good Progress towards PT goals: Progressing toward goals    Frequency  Min 3X/week    PT Plan Current plan remains appropriate    Co-evaluation             End of Session   Activity Tolerance: Patient tolerated treatment well Patient left: in bed;with call bell/phone within reach     Time: 1400-1426 PT Time Calculation (min) (ACUTE ONLY): 26 min  Charges:  $Gait Training: 23-37 mins                    G CodesKenyon Ana 12-13-15,  2:41 PM

## 2015-12-04 DIAGNOSIS — I10 Essential (primary) hypertension: Secondary | ICD-10-CM | POA: Diagnosis not present

## 2015-12-04 DIAGNOSIS — R41841 Cognitive communication deficit: Secondary | ICD-10-CM | POA: Diagnosis not present

## 2015-12-04 DIAGNOSIS — Z933 Colostomy status: Secondary | ICD-10-CM | POA: Diagnosis not present

## 2015-12-04 DIAGNOSIS — F329 Major depressive disorder, single episode, unspecified: Secondary | ICD-10-CM | POA: Diagnosis not present

## 2015-12-04 DIAGNOSIS — E039 Hypothyroidism, unspecified: Secondary | ICD-10-CM | POA: Diagnosis not present

## 2015-12-04 DIAGNOSIS — G43909 Migraine, unspecified, not intractable, without status migrainosus: Secondary | ICD-10-CM | POA: Diagnosis not present

## 2015-12-04 DIAGNOSIS — E785 Hyperlipidemia, unspecified: Secondary | ICD-10-CM | POA: Diagnosis not present

## 2015-12-04 DIAGNOSIS — K219 Gastro-esophageal reflux disease without esophagitis: Secondary | ICD-10-CM | POA: Diagnosis not present

## 2015-12-04 DIAGNOSIS — M6281 Muscle weakness (generalized): Secondary | ICD-10-CM | POA: Diagnosis not present

## 2015-12-04 DIAGNOSIS — K631 Perforation of intestine (nontraumatic): Secondary | ICD-10-CM | POA: Diagnosis not present

## 2015-12-04 DIAGNOSIS — K5900 Constipation, unspecified: Secondary | ICD-10-CM | POA: Diagnosis not present

## 2015-12-04 DIAGNOSIS — J449 Chronic obstructive pulmonary disease, unspecified: Secondary | ICD-10-CM | POA: Diagnosis not present

## 2015-12-04 DIAGNOSIS — M8580 Other specified disorders of bone density and structure, unspecified site: Secondary | ICD-10-CM | POA: Diagnosis not present

## 2015-12-04 DIAGNOSIS — K9400 Colostomy complication, unspecified: Secondary | ICD-10-CM | POA: Diagnosis not present

## 2015-12-04 DIAGNOSIS — R1311 Dysphagia, oral phase: Secondary | ICD-10-CM | POA: Diagnosis not present

## 2015-12-04 DIAGNOSIS — F322 Major depressive disorder, single episode, severe without psychotic features: Secondary | ICD-10-CM | POA: Diagnosis not present

## 2015-12-04 DIAGNOSIS — R2681 Unsteadiness on feet: Secondary | ICD-10-CM | POA: Diagnosis not present

## 2015-12-04 DIAGNOSIS — K630 Abscess of intestine: Secondary | ICD-10-CM | POA: Diagnosis not present

## 2015-12-04 DIAGNOSIS — K651 Peritoneal abscess: Secondary | ICD-10-CM | POA: Diagnosis not present

## 2015-12-04 DIAGNOSIS — D72829 Elevated white blood cell count, unspecified: Secondary | ICD-10-CM | POA: Diagnosis not present

## 2015-12-04 DIAGNOSIS — F411 Generalized anxiety disorder: Secondary | ICD-10-CM | POA: Diagnosis not present

## 2015-12-04 LAB — CBC
HCT: 32.8 % — ABNORMAL LOW (ref 36.0–46.0)
Hemoglobin: 11 g/dL — ABNORMAL LOW (ref 12.0–15.0)
MCH: 28.1 pg (ref 26.0–34.0)
MCHC: 33.5 g/dL (ref 30.0–36.0)
MCV: 83.7 fL (ref 78.0–100.0)
Platelets: 527 10*3/uL — ABNORMAL HIGH (ref 150–400)
RBC: 3.92 MIL/uL (ref 3.87–5.11)
RDW: 13.3 % (ref 11.5–15.5)
WBC: 19.1 10*3/uL — ABNORMAL HIGH (ref 4.0–10.5)

## 2015-12-04 LAB — BASIC METABOLIC PANEL
Anion gap: 11 (ref 5–15)
BUN: 7 mg/dL (ref 6–20)
CO2: 31 mmol/L (ref 22–32)
Calcium: 8.5 mg/dL — ABNORMAL LOW (ref 8.9–10.3)
Chloride: 95 mmol/L — ABNORMAL LOW (ref 101–111)
Creatinine, Ser: 0.69 mg/dL (ref 0.44–1.00)
GFR calc Af Amer: >60 mL/min (ref >60)
GFR calc non Af Amer: >60 mL/min (ref >60)
Glucose, Bld: 125 mg/dL — ABNORMAL HIGH (ref 65–99)
Potassium: 3.4 mmol/L — ABNORMAL LOW (ref 3.5–5.1)
Sodium: 137 mmol/L (ref 135–145)

## 2015-12-04 LAB — CULTURE, BLOOD (ROUTINE X 2)
Culture: NO GROWTH
Culture: NO GROWTH

## 2015-12-04 MED ORDER — POLYETHYLENE GLYCOL 3350 17 G PO PACK: 17.0000 g | PACK | Freq: Every day | ORAL | Status: DC | PRN

## 2015-12-04 MED ORDER — HYDROCODONE-ACETAMINOPHEN 5-325 MG PO TABS
1.0000 | ORAL_TABLET | ORAL | Status: DC | PRN
  Administered 2015-12-04: 2 via ORAL
  Filled 2015-12-04: qty 2

## 2015-12-04 MED ORDER — AMOXICILLIN-POT CLAVULANATE 875-125 MG PO TABS: 1.0000 | ORAL_TABLET | Freq: Two times a day (BID) | ORAL | Status: DC

## 2015-12-04 MED ORDER — HYDROCODONE-ACETAMINOPHEN 5-325 MG PO TABS: 1.0000 | ORAL_TABLET | ORAL | Status: DC | PRN

## 2015-12-04 NOTE — Progress Notes (Signed)
Pt discharged to [facility] in [place] on [address] - taken via PTAR stretcher in no distress, alert, oriented, appropriate, vitals stable at time of discharge. Report given to [facility] Nurse at Facility to assume care of patient at transfer to 609 @ Facility - Discharge Instructions faxed to facility at [phone] - 2 prescriptions sent in discharge packet to facility.

## 2015-12-04 NOTE — Progress Notes (Signed)
Clinical Social Work  AVS and DC summary medications did not Orthoptist. RN explained that AVS medications were correct. CSW faxed AVS to SNF and spoke with RN Inez Catalina) who confirmed that fax was received and she is aware of correct medication list.  Sindy Messing, LCSW Weekend Coverage

## 2015-12-04 NOTE — Progress Notes (Signed)
Informed facility of patient disposition  LCSWA met with patient at bedside. Inform patient about DC to [facility] LCSWA informed nurse to send woundcare equipment to facility. No further needs at this time LCSWA is signing off.  Kathrin Greathouse, Latanya Presser, MSW Clinical Social Worker 5E and Psychiatric Service Line [phone] 12/04/2015  10:11 AM

## 2015-12-05 DIAGNOSIS — I10 Essential (primary) hypertension: Secondary | ICD-10-CM | POA: Diagnosis not present

## 2015-12-05 DIAGNOSIS — F322 Major depressive disorder, single episode, severe without psychotic features: Secondary | ICD-10-CM | POA: Diagnosis not present

## 2015-12-05 DIAGNOSIS — J449 Chronic obstructive pulmonary disease, unspecified: Secondary | ICD-10-CM | POA: Diagnosis not present

## 2015-12-05 DIAGNOSIS — F411 Generalized anxiety disorder: Secondary | ICD-10-CM | POA: Diagnosis not present

## 2015-12-05 DIAGNOSIS — K630 Abscess of intestine: Secondary | ICD-10-CM | POA: Diagnosis not present

## 2015-12-09 ENCOUNTER — Ambulatory Visit: Payer: Medicare Other | Admitting: Family

## 2015-12-10 ENCOUNTER — Encounter: Payer: Self-pay | Admitting: Family Medicine

## 2015-12-16 DIAGNOSIS — J449 Chronic obstructive pulmonary disease, unspecified: Secondary | ICD-10-CM | POA: Diagnosis not present

## 2015-12-16 DIAGNOSIS — K5900 Constipation, unspecified: Secondary | ICD-10-CM | POA: Diagnosis not present

## 2015-12-16 DIAGNOSIS — F329 Major depressive disorder, single episode, unspecified: Secondary | ICD-10-CM | POA: Diagnosis not present

## 2015-12-16 DIAGNOSIS — K630 Abscess of intestine: Secondary | ICD-10-CM | POA: Diagnosis not present

## 2015-12-16 DIAGNOSIS — I10 Essential (primary) hypertension: Secondary | ICD-10-CM | POA: Diagnosis not present

## 2015-12-16 DIAGNOSIS — Z933 Colostomy status: Secondary | ICD-10-CM | POA: Diagnosis not present

## 2015-12-16 DIAGNOSIS — E785 Hyperlipidemia, unspecified: Secondary | ICD-10-CM | POA: Diagnosis not present

## 2015-12-16 DIAGNOSIS — F411 Generalized anxiety disorder: Secondary | ICD-10-CM | POA: Diagnosis not present

## 2015-12-17 DIAGNOSIS — F341 Dysthymic disorder: Secondary | ICD-10-CM | POA: Diagnosis not present

## 2015-12-17 DIAGNOSIS — F17211 Nicotine dependence, cigarettes, in remission: Secondary | ICD-10-CM | POA: Diagnosis not present

## 2015-12-17 DIAGNOSIS — E559 Vitamin D deficiency, unspecified: Secondary | ICD-10-CM | POA: Diagnosis not present

## 2015-12-17 DIAGNOSIS — J449 Chronic obstructive pulmonary disease, unspecified: Secondary | ICD-10-CM | POA: Diagnosis not present

## 2015-12-17 DIAGNOSIS — Z433 Encounter for attention to colostomy: Secondary | ICD-10-CM | POA: Diagnosis not present

## 2015-12-17 DIAGNOSIS — Z48815 Encounter for surgical aftercare following surgery on the digestive system: Secondary | ICD-10-CM | POA: Diagnosis not present

## 2015-12-17 DIAGNOSIS — G894 Chronic pain syndrome: Secondary | ICD-10-CM | POA: Diagnosis not present

## 2015-12-17 DIAGNOSIS — K5909 Other constipation: Secondary | ICD-10-CM | POA: Diagnosis not present

## 2015-12-17 DIAGNOSIS — M6281 Muscle weakness (generalized): Secondary | ICD-10-CM | POA: Diagnosis not present

## 2015-12-17 DIAGNOSIS — M549 Dorsalgia, unspecified: Secondary | ICD-10-CM | POA: Diagnosis not present

## 2015-12-17 DIAGNOSIS — E7800 Pure hypercholesterolemia, unspecified: Secondary | ICD-10-CM | POA: Diagnosis not present

## 2015-12-18 ENCOUNTER — Other Ambulatory Visit: Payer: Self-pay | Admitting: Pharmacist

## 2015-12-21 DIAGNOSIS — K5909 Other constipation: Secondary | ICD-10-CM | POA: Diagnosis not present

## 2015-12-21 DIAGNOSIS — Z48815 Encounter for surgical aftercare following surgery on the digestive system: Secondary | ICD-10-CM | POA: Diagnosis not present

## 2015-12-21 DIAGNOSIS — M6281 Muscle weakness (generalized): Secondary | ICD-10-CM | POA: Diagnosis not present

## 2015-12-21 DIAGNOSIS — Z433 Encounter for attention to colostomy: Secondary | ICD-10-CM | POA: Diagnosis not present

## 2015-12-21 DIAGNOSIS — F341 Dysthymic disorder: Secondary | ICD-10-CM | POA: Diagnosis not present

## 2015-12-21 DIAGNOSIS — J449 Chronic obstructive pulmonary disease, unspecified: Secondary | ICD-10-CM | POA: Diagnosis not present

## 2015-12-23 ENCOUNTER — Encounter: Payer: Self-pay | Admitting: Family

## 2015-12-23 ENCOUNTER — Ambulatory Visit (INDEPENDENT_AMBULATORY_CARE_PROVIDER_SITE_OTHER): Payer: Medicare Other | Admitting: Family

## 2015-12-23 VITALS — BP 114/68 | HR 83 | Temp 98.9°F | Ht 61.0 in | Wt 119.0 lb

## 2015-12-23 DIAGNOSIS — K631 Perforation of intestine (nontraumatic): Secondary | ICD-10-CM | POA: Diagnosis not present

## 2015-12-23 DIAGNOSIS — K5909 Other constipation: Secondary | ICD-10-CM

## 2015-12-23 DIAGNOSIS — K5900 Constipation, unspecified: Secondary | ICD-10-CM

## 2015-12-23 DIAGNOSIS — K651 Peritoneal abscess: Secondary | ICD-10-CM | POA: Diagnosis not present

## 2015-12-23 DIAGNOSIS — Z09 Encounter for follow-up examination after completed treatment for conditions other than malignant neoplasm: Secondary | ICD-10-CM

## 2015-12-23 MED ORDER — ONDANSETRON HCL 4 MG PO TABS: 4.0000 mg | ORAL_TABLET | Freq: Three times a day (TID) | ORAL | Status: DC | PRN

## 2015-12-23 MED ORDER — HYDROCODONE-ACETAMINOPHEN 5-325 MG PO TABS: 1.0000 | ORAL_TABLET | ORAL | Status: DC | PRN

## 2015-12-23 NOTE — Addendum Note (Signed)
Addended by: Evelina Dun A on: 12/23/2015 03:58 PM   Modules accepted: Orders

## 2015-12-23 NOTE — Progress Notes (Signed)
   Subjective:    Patient ID: Stacy Dalton, female    DOB: 02-23-1951, 65 y.o.   MRN: [id number]  HPI Pt presents to the office today for hospital follow up. PT went to the ED on 11/29/15 with bowel perforation. Pt was discharged from hospital 12/04/15 and went to SNF. PT states she was doing well with rehab and was discharged early on 12/16/15. Pt currently has ostomy and is doing well with it. Having stools and gas. Pt's incision is doing well with no redness, warmth, or drainage present. PT has follow up appt with general surgery on 12/31/15.  [facility] notes reviewed  Review of Systems  Constitutional: Negative.   HENT: Negative.   Eyes: Negative.   Respiratory: Negative.  Negative for shortness of breath.   Cardiovascular: Negative.  Negative for palpitations.  Gastrointestinal: Negative.   Endocrine: Negative.   Genitourinary: Negative.   Musculoskeletal: Negative.   Neurological: Negative.  Negative for headaches.  Hematological: Negative.   Psychiatric/Behavioral: Negative.   All other systems reviewed and are negative.      Objective:   Physical Exam  Constitutional: She is oriented to person, place, and time. She appears well-developed and well-nourished. No distress.  HENT:  Head: Normocephalic.  Right Ear: External ear normal.  Mouth/Throat: Oropharynx is clear and moist.  Eyes: Pupils are equal, round, and reactive to light.  Neck: Normal range of motion. Neck supple. No thyromegaly present.  Cardiovascular: Normal rate, regular rhythm, normal heart sounds and intact distal pulses.   No murmur heard. Pulmonary/Chest: Effort normal and breath sounds normal. No respiratory distress. She has no wheezes.  Abdominal: Soft. Bowel sounds are normal. She exhibits no distension. There is no tenderness.  Ostomy present- stool and gas present abd incision well approximated, no warmth, drainage, or erythemas present   Musculoskeletal: Normal range of motion. She exhibits  no edema or tenderness.  Neurological: She is alert and oriented to person, place, and time.  Skin: Skin is warm and dry.  Psychiatric: She has a normal mood and affect. Her behavior is normal. Judgment and thought content normal.  Vitals reviewed.   BP 114/68 mmHg  Pulse 83  Temp(Src) 98.9 F (37.2 C) (Oral)  Ht 5\' 1"  (1.549 m)  Wt 119 lb (53.978 kg)  BMI 22.50 kg/m2       Assessment & Plan:  1. Peritonitis with abscess of intestine (HCC) - CBC with Differential/Platelet  2. Hospital discharge follow-up - CBC with Differential/Platelet  3. Bowel perforation (HCC) - CBC with Differential/Platelet  4. Chronic constipation - CBC with Differential/Platelet  Continue stool softeners Keep appt with General Surgery Labs pending Pain medication refilled- Only take as needed- Will cause constipation RTO if any s/s of infection  Evelina Dun, FNP

## 2015-12-23 NOTE — Patient Instructions (Signed)

## 2015-12-24 DIAGNOSIS — N390 Urinary tract infection, site not specified: Secondary | ICD-10-CM | POA: Diagnosis not present

## 2015-12-24 DIAGNOSIS — K5909 Other constipation: Secondary | ICD-10-CM | POA: Diagnosis not present

## 2015-12-24 DIAGNOSIS — F341 Dysthymic disorder: Secondary | ICD-10-CM | POA: Diagnosis not present

## 2015-12-24 DIAGNOSIS — M6281 Muscle weakness (generalized): Secondary | ICD-10-CM | POA: Diagnosis not present

## 2015-12-24 DIAGNOSIS — Z48815 Encounter for surgical aftercare following surgery on the digestive system: Secondary | ICD-10-CM | POA: Diagnosis not present

## 2015-12-24 DIAGNOSIS — Z433 Encounter for attention to colostomy: Secondary | ICD-10-CM | POA: Diagnosis not present

## 2015-12-24 DIAGNOSIS — J449 Chronic obstructive pulmonary disease, unspecified: Secondary | ICD-10-CM | POA: Diagnosis not present

## 2015-12-24 LAB — CBC WITH DIFFERENTIAL/PLATELET
Basophils Absolute: 0 10*3/uL (ref 0.0–0.2)
Basos: 1 %
EOS (ABSOLUTE): 0.5 10*3/uL — ABNORMAL HIGH (ref 0.0–0.4)
Eos: 7 %
Hematocrit: 33.2 % — ABNORMAL LOW (ref 34.0–46.6)
Hemoglobin: 10.5 g/dL — ABNORMAL LOW (ref 11.1–15.9)
Immature Grans (Abs): 0.1 10*3/uL (ref 0.0–0.1)
Immature Granulocytes: 1 %
Lymphocytes Absolute: 2.5 10*3/uL (ref 0.7–3.1)
Lymphs: 34 %
MCH: 27.3 pg (ref 26.6–33.0)
MCHC: 31.6 g/dL (ref 31.5–35.7)
MCV: 86 fL (ref 79–97)
Monocytes Absolute: 0.9 10*3/uL (ref 0.1–0.9)
Monocytes: 12 %
Neutrophils Absolute: 3.4 10*3/uL (ref 1.4–7.0)
Neutrophils: 45 %
Platelets: 407 10*3/uL — ABNORMAL HIGH (ref 150–379)
RBC: 3.85 x10E6/uL (ref 3.77–5.28)
RDW: 13.8 % (ref 12.3–15.4)
WBC: 7.3 10*3/uL (ref 3.4–10.8)

## 2015-12-28 ENCOUNTER — Other Ambulatory Visit: Payer: Self-pay | Admitting: Family

## 2015-12-28 DIAGNOSIS — M6281 Muscle weakness (generalized): Secondary | ICD-10-CM | POA: Diagnosis not present

## 2015-12-28 DIAGNOSIS — F341 Dysthymic disorder: Secondary | ICD-10-CM | POA: Diagnosis not present

## 2015-12-28 DIAGNOSIS — K5909 Other constipation: Secondary | ICD-10-CM | POA: Diagnosis not present

## 2015-12-28 DIAGNOSIS — Z48815 Encounter for surgical aftercare following surgery on the digestive system: Secondary | ICD-10-CM | POA: Diagnosis not present

## 2015-12-28 DIAGNOSIS — J449 Chronic obstructive pulmonary disease, unspecified: Secondary | ICD-10-CM | POA: Diagnosis not present

## 2015-12-28 DIAGNOSIS — Z433 Encounter for attention to colostomy: Secondary | ICD-10-CM | POA: Diagnosis not present

## 2015-12-28 MED ORDER — SULFAMETHOXAZOLE-TRIMETHOPRIM 800-160 MG PO TABS: 1.0000 | ORAL_TABLET | Freq: Two times a day (BID) | ORAL | Status: DC

## 2015-12-28 NOTE — Progress Notes (Signed)
Bactrim Prescription sent to pharmacy for UTI

## 2015-12-29 ENCOUNTER — Telehealth: Payer: Self-pay | Admitting: Family Medicine

## 2015-12-29 DIAGNOSIS — Z433 Encounter for attention to colostomy: Secondary | ICD-10-CM | POA: Diagnosis not present

## 2015-12-29 DIAGNOSIS — F341 Dysthymic disorder: Secondary | ICD-10-CM | POA: Diagnosis not present

## 2015-12-29 DIAGNOSIS — J449 Chronic obstructive pulmonary disease, unspecified: Secondary | ICD-10-CM | POA: Diagnosis not present

## 2015-12-29 DIAGNOSIS — Z48815 Encounter for surgical aftercare following surgery on the digestive system: Secondary | ICD-10-CM | POA: Diagnosis not present

## 2015-12-29 DIAGNOSIS — M6281 Muscle weakness (generalized): Secondary | ICD-10-CM | POA: Diagnosis not present

## 2015-12-29 DIAGNOSIS — K5909 Other constipation: Secondary | ICD-10-CM | POA: Diagnosis not present

## 2015-12-29 MED ORDER — SULFAMETHOXAZOLE-TRIMETHOPRIM 800-160 MG PO TABS: 1.0000 | ORAL_TABLET | Freq: Two times a day (BID) | ORAL | Status: DC

## 2015-12-29 MED ORDER — ONDANSETRON HCL 4 MG PO TABS: 4.0000 mg | ORAL_TABLET | Freq: Three times a day (TID) | ORAL | Status: DC | PRN

## 2015-12-29 NOTE — Telephone Encounter (Signed)
Antibiotic and Zofran were sent to [facility] so resent to [facility] in [place] and pt is aware.

## 2015-12-30 ENCOUNTER — Other Ambulatory Visit: Payer: Self-pay | Admitting: Family

## 2015-12-30 ENCOUNTER — Ambulatory Visit: Payer: Medicare Other | Admitting: Family

## 2015-12-30 DIAGNOSIS — F341 Dysthymic disorder: Secondary | ICD-10-CM | POA: Diagnosis not present

## 2015-12-30 DIAGNOSIS — Z48815 Encounter for surgical aftercare following surgery on the digestive system: Secondary | ICD-10-CM | POA: Diagnosis not present

## 2015-12-30 DIAGNOSIS — K5909 Other constipation: Secondary | ICD-10-CM | POA: Diagnosis not present

## 2015-12-30 DIAGNOSIS — J449 Chronic obstructive pulmonary disease, unspecified: Secondary | ICD-10-CM | POA: Diagnosis not present

## 2015-12-30 DIAGNOSIS — Z433 Encounter for attention to colostomy: Secondary | ICD-10-CM | POA: Diagnosis not present

## 2015-12-30 DIAGNOSIS — M6281 Muscle weakness (generalized): Secondary | ICD-10-CM | POA: Diagnosis not present

## 2015-12-30 MED ORDER — NITROFURANTOIN MONOHYD MACRO 100 MG PO CAPS: 100.0000 mg | ORAL_CAPSULE | Freq: Two times a day (BID) | ORAL | Status: DC

## 2015-12-30 NOTE — Progress Notes (Signed)
Pt's urine culture back. PT to stop bactrim and nitrofurantoin. Prescription sent to pharmacy

## 2015-12-30 NOTE — Progress Notes (Signed)
Pt is aware and will go to the pharmacy to get new rx.

## 2015-12-31 ENCOUNTER — Ambulatory Visit (INDEPENDENT_AMBULATORY_CARE_PROVIDER_SITE_OTHER): Payer: Medicare Other | Admitting: Family Medicine

## 2015-12-31 DIAGNOSIS — K5909 Other constipation: Secondary | ICD-10-CM

## 2015-12-31 DIAGNOSIS — Z433 Encounter for attention to colostomy: Secondary | ICD-10-CM | POA: Diagnosis not present

## 2015-12-31 DIAGNOSIS — Z48815 Encounter for surgical aftercare following surgery on the digestive system: Secondary | ICD-10-CM | POA: Diagnosis not present

## 2015-12-31 DIAGNOSIS — J449 Chronic obstructive pulmonary disease, unspecified: Secondary | ICD-10-CM | POA: Diagnosis not present

## 2015-12-31 DIAGNOSIS — M6281 Muscle weakness (generalized): Secondary | ICD-10-CM | POA: Diagnosis not present

## 2015-12-31 DIAGNOSIS — F341 Dysthymic disorder: Secondary | ICD-10-CM | POA: Diagnosis not present

## 2016-01-04 DIAGNOSIS — Z433 Encounter for attention to colostomy: Secondary | ICD-10-CM | POA: Diagnosis not present

## 2016-01-04 DIAGNOSIS — M6281 Muscle weakness (generalized): Secondary | ICD-10-CM | POA: Diagnosis not present

## 2016-01-04 DIAGNOSIS — F341 Dysthymic disorder: Secondary | ICD-10-CM | POA: Diagnosis not present

## 2016-01-04 DIAGNOSIS — Z48815 Encounter for surgical aftercare following surgery on the digestive system: Secondary | ICD-10-CM | POA: Diagnosis not present

## 2016-01-04 DIAGNOSIS — J449 Chronic obstructive pulmonary disease, unspecified: Secondary | ICD-10-CM | POA: Diagnosis not present

## 2016-01-04 DIAGNOSIS — K5909 Other constipation: Secondary | ICD-10-CM | POA: Diagnosis not present

## 2016-01-11 ENCOUNTER — Encounter: Payer: Self-pay | Admitting: Family

## 2016-01-11 ENCOUNTER — Ambulatory Visit (INDEPENDENT_AMBULATORY_CARE_PROVIDER_SITE_OTHER): Payer: Medicare Other | Admitting: Family

## 2016-01-11 VITALS — BP 130/74 | HR 87 | Temp 99.1°F | Ht 61.0 in | Wt 117.0 lb

## 2016-01-11 DIAGNOSIS — G894 Chronic pain syndrome: Secondary | ICD-10-CM

## 2016-01-11 DIAGNOSIS — G8929 Other chronic pain: Secondary | ICD-10-CM | POA: Diagnosis not present

## 2016-01-11 DIAGNOSIS — R3989 Other symptoms and signs involving the genitourinary system: Secondary | ICD-10-CM

## 2016-01-11 DIAGNOSIS — F341 Dysthymic disorder: Secondary | ICD-10-CM | POA: Diagnosis not present

## 2016-01-11 DIAGNOSIS — M549 Dorsalgia, unspecified: Secondary | ICD-10-CM

## 2016-01-11 DIAGNOSIS — N3000 Acute cystitis without hematuria: Secondary | ICD-10-CM | POA: Diagnosis not present

## 2016-01-11 DIAGNOSIS — K651 Peritoneal abscess: Secondary | ICD-10-CM | POA: Diagnosis not present

## 2016-01-11 DIAGNOSIS — J449 Chronic obstructive pulmonary disease, unspecified: Secondary | ICD-10-CM | POA: Diagnosis not present

## 2016-01-11 DIAGNOSIS — K5909 Other constipation: Secondary | ICD-10-CM | POA: Diagnosis not present

## 2016-01-11 DIAGNOSIS — Z48815 Encounter for surgical aftercare following surgery on the digestive system: Secondary | ICD-10-CM | POA: Diagnosis not present

## 2016-01-11 DIAGNOSIS — M6281 Muscle weakness (generalized): Secondary | ICD-10-CM | POA: Diagnosis not present

## 2016-01-11 DIAGNOSIS — Z433 Encounter for attention to colostomy: Secondary | ICD-10-CM | POA: Diagnosis not present

## 2016-01-11 LAB — URINALYSIS, COMPLETE
Bilirubin, UA: NEGATIVE
Glucose, UA: NEGATIVE
Ketones, UA: NEGATIVE
Nitrite, UA: NEGATIVE
Protein, UA: NEGATIVE
RBC, UA: NEGATIVE
Specific Gravity, UA: 1.015 (ref 1.005–1.030)
Urobilinogen, Ur: 0.2 mg/dL (ref 0.2–1.0)
pH, UA: 7 (ref 5.0–7.5)

## 2016-01-11 LAB — MICROSCOPIC EXAMINATION: RBC, UA: NONE SEEN /hpf (ref 0–?)

## 2016-01-11 MED ORDER — HYDROCODONE-ACETAMINOPHEN 5-325 MG PO TABS: 1.0000 | ORAL_TABLET | ORAL | Status: DC | PRN

## 2016-01-11 MED ORDER — SULFAMETHOXAZOLE-TRIMETHOPRIM 800-160 MG PO TABS: 1.0000 | ORAL_TABLET | Freq: Two times a day (BID) | ORAL | Status: DC

## 2016-01-11 NOTE — Progress Notes (Signed)
Subjective:    Patient ID: Stacy Dalton, female    DOB: 1951/02/19, 65 y.o.   MRN: [id number]  Pt presents to the office today with complaints of intermittent dysuria. PT was treated for a UTI about a month ago. PT also complaining of increased abdominal pain. PT states the Ultram is not working as well as it was. PT had a bowel perforation in May 2017 and had a ostomy placed. Pt states she continues to have intermittent low back pain and  abdominal pain of 8-9 out 10.  Dysuria  This is a new problem. The current episode started 1 to 4 weeks ago. The problem occurs intermittently. The problem has been waxing and waning. The quality of the pain is described as aching. The pain is at a severity of 3/10. There has been no fever (99). Associated symptoms include flank pain, nausea and urgency. Pertinent negatives include no frequency or hematuria. She has tried increased fluids for the symptoms. The treatment provided mild relief.      Review of Systems  Constitutional: Negative.   HENT: Negative.   Eyes: Negative.   Respiratory: Negative.  Negative for shortness of breath.   Cardiovascular: Negative.  Negative for palpitations.  Gastrointestinal: Positive for nausea and abdominal pain.  Endocrine: Negative.   Genitourinary: Positive for dysuria, urgency and flank pain. Negative for frequency and hematuria.  Musculoskeletal: Negative.   Neurological: Negative.  Negative for headaches.  Hematological: Negative.   Psychiatric/Behavioral: Negative.   All other systems reviewed and are negative.      Objective:   Physical Exam  Constitutional: She is oriented to person, place, and time. She appears well-developed and well-nourished. No distress.  HENT:  Head: Normocephalic.  Neck: Normal range of motion.  Cardiovascular: Normal rate, regular rhythm, normal heart sounds and intact distal pulses.   No murmur heard. Pulmonary/Chest: Effort normal and breath sounds normal. No  respiratory distress. She has no wheezes.  Abdominal: Soft. Bowel sounds are normal. She exhibits no distension. There is tenderness.  abd incision present. Well approximated, no redness, warmth or discharge present  Musculoskeletal: Normal range of motion. She exhibits no edema or tenderness.  Neurological: She is alert and oriented to person, place, and time. She has normal reflexes. No cranial nerve deficit.  Skin: Skin is warm and dry.  Psychiatric: She has a normal mood and affect. Her behavior is normal. Judgment and thought content normal.  Vitals reviewed.     BP 130/74 mmHg  Pulse 87  Temp(Src) 99.1 F (37.3 C) (Oral)  Ht 5\' 1"  (1.549 m)  Wt 117 lb (53.071 kg)  BMI 22.12 kg/m2     Assessment & Plan:  1. Urine troubles - Urinalysis, Complete  2. Peritonitis with abscess of intestine (HCC) -Pt states she needs a refill on Norco and hopes to go back to her Ultram after she heals completely  - HYDROcodone-acetaminophen (NORCO/VICODIN) 5-325 MG tablet; Take 1-2 tablets by mouth every 4 (four) hours as needed for moderate pain.  Dispense: 60 tablet; Refill: 0  3. Chronic back pain - HYDROcodone-acetaminophen (NORCO/VICODIN) 5-325 MG tablet; Take 1-2 tablets by mouth every 4 (four) hours as needed for moderate pain.  Dispense: 60 tablet; Refill: 0  4. Chronic pain syndrome - HYDROcodone-acetaminophen (NORCO/VICODIN) 5-325 MG tablet; Take 1-2 tablets by mouth every 4 (four) hours as needed for moderate pain.  Dispense: 60 tablet; Refill: 0  5. Acute cystitis without hematuria -Force fluids AZO over the counter X2 days RTO prn -  sulfamethoxazole-trimethoprim (BACTRIM DS) 800-160 MG tablet; Take 1 tablet by mouth 2 (two) times daily.  Dispense: 14 tablet; Refill: 0  Evelina Dun, FNP

## 2016-01-11 NOTE — Patient Instructions (Signed)

## 2016-01-13 ENCOUNTER — Telehealth: Payer: Self-pay | Admitting: Family

## 2016-01-13 DIAGNOSIS — J449 Chronic obstructive pulmonary disease, unspecified: Secondary | ICD-10-CM | POA: Diagnosis not present

## 2016-01-13 DIAGNOSIS — Z48815 Encounter for surgical aftercare following surgery on the digestive system: Secondary | ICD-10-CM | POA: Diagnosis not present

## 2016-01-13 DIAGNOSIS — M6281 Muscle weakness (generalized): Secondary | ICD-10-CM | POA: Diagnosis not present

## 2016-01-13 DIAGNOSIS — F341 Dysthymic disorder: Secondary | ICD-10-CM | POA: Diagnosis not present

## 2016-01-13 DIAGNOSIS — Z433 Encounter for attention to colostomy: Secondary | ICD-10-CM | POA: Diagnosis not present

## 2016-01-13 DIAGNOSIS — K5909 Other constipation: Secondary | ICD-10-CM | POA: Diagnosis not present

## 2016-01-13 MED ORDER — ONDANSETRON HCL 4 MG PO TABS: 4.0000 mg | ORAL_TABLET | Freq: Three times a day (TID) | ORAL | Status: DC | PRN

## 2016-01-13 NOTE — Telephone Encounter (Signed)
Prescription sent to pharmacy.

## 2016-01-17 ENCOUNTER — Telehealth: Payer: Self-pay | Admitting: Family

## 2016-01-17 DIAGNOSIS — Z48815 Encounter for surgical aftercare following surgery on the digestive system: Secondary | ICD-10-CM | POA: Diagnosis not present

## 2016-01-17 DIAGNOSIS — K5909 Other constipation: Secondary | ICD-10-CM | POA: Diagnosis not present

## 2016-01-17 DIAGNOSIS — M549 Dorsalgia, unspecified: Secondary | ICD-10-CM

## 2016-01-17 DIAGNOSIS — G894 Chronic pain syndrome: Secondary | ICD-10-CM

## 2016-01-17 DIAGNOSIS — G8929 Other chronic pain: Secondary | ICD-10-CM

## 2016-01-17 DIAGNOSIS — J449 Chronic obstructive pulmonary disease, unspecified: Secondary | ICD-10-CM | POA: Diagnosis not present

## 2016-01-17 DIAGNOSIS — F341 Dysthymic disorder: Secondary | ICD-10-CM | POA: Diagnosis not present

## 2016-01-17 DIAGNOSIS — K651 Peritoneal abscess: Secondary | ICD-10-CM

## 2016-01-17 DIAGNOSIS — Z433 Encounter for attention to colostomy: Secondary | ICD-10-CM | POA: Diagnosis not present

## 2016-01-17 DIAGNOSIS — M6281 Muscle weakness (generalized): Secondary | ICD-10-CM | POA: Diagnosis not present

## 2016-01-18 MED ORDER — HYDROCODONE-ACETAMINOPHEN 5-325 MG PO TABS: 1.0000 | ORAL_TABLET | Freq: Three times a day (TID) | ORAL | Status: DC | PRN

## 2016-01-18 NOTE — Telephone Encounter (Signed)
Aware,hydrocodone script ready for pick up.

## 2016-01-18 NOTE — Telephone Encounter (Signed)
RX ready for pick up. This RX should last her a month. If she is having that much pain she needs to follow up with her surgeon

## 2016-01-18 NOTE — Telephone Encounter (Signed)
Patient aware that Rx is ready to pick up

## 2016-01-25 DIAGNOSIS — Z433 Encounter for attention to colostomy: Secondary | ICD-10-CM | POA: Diagnosis not present

## 2016-01-25 DIAGNOSIS — K5909 Other constipation: Secondary | ICD-10-CM | POA: Diagnosis not present

## 2016-01-25 DIAGNOSIS — M6281 Muscle weakness (generalized): Secondary | ICD-10-CM | POA: Diagnosis not present

## 2016-01-25 DIAGNOSIS — Z48815 Encounter for surgical aftercare following surgery on the digestive system: Secondary | ICD-10-CM | POA: Diagnosis not present

## 2016-01-25 DIAGNOSIS — F341 Dysthymic disorder: Secondary | ICD-10-CM | POA: Diagnosis not present

## 2016-01-25 DIAGNOSIS — J449 Chronic obstructive pulmonary disease, unspecified: Secondary | ICD-10-CM | POA: Diagnosis not present

## 2016-01-31 ENCOUNTER — Other Ambulatory Visit: Payer: Self-pay | Admitting: Family Medicine

## 2016-01-31 ENCOUNTER — Other Ambulatory Visit: Payer: Self-pay | Admitting: Family

## 2016-02-01 DIAGNOSIS — Z48815 Encounter for surgical aftercare following surgery on the digestive system: Secondary | ICD-10-CM | POA: Diagnosis not present

## 2016-02-01 DIAGNOSIS — J449 Chronic obstructive pulmonary disease, unspecified: Secondary | ICD-10-CM | POA: Diagnosis not present

## 2016-02-01 DIAGNOSIS — M6281 Muscle weakness (generalized): Secondary | ICD-10-CM | POA: Diagnosis not present

## 2016-02-01 DIAGNOSIS — F341 Dysthymic disorder: Secondary | ICD-10-CM | POA: Diagnosis not present

## 2016-02-01 DIAGNOSIS — Z433 Encounter for attention to colostomy: Secondary | ICD-10-CM | POA: Diagnosis not present

## 2016-02-01 DIAGNOSIS — K5909 Other constipation: Secondary | ICD-10-CM | POA: Diagnosis not present

## 2016-02-08 DIAGNOSIS — M6281 Muscle weakness (generalized): Secondary | ICD-10-CM | POA: Diagnosis not present

## 2016-02-08 DIAGNOSIS — K5909 Other constipation: Secondary | ICD-10-CM | POA: Diagnosis not present

## 2016-02-08 DIAGNOSIS — Z48815 Encounter for surgical aftercare following surgery on the digestive system: Secondary | ICD-10-CM | POA: Diagnosis not present

## 2016-02-08 DIAGNOSIS — Z433 Encounter for attention to colostomy: Secondary | ICD-10-CM | POA: Diagnosis not present

## 2016-02-08 DIAGNOSIS — J449 Chronic obstructive pulmonary disease, unspecified: Secondary | ICD-10-CM | POA: Diagnosis not present

## 2016-02-08 DIAGNOSIS — F341 Dysthymic disorder: Secondary | ICD-10-CM | POA: Diagnosis not present

## 2016-02-17 ENCOUNTER — Other Ambulatory Visit: Payer: Self-pay | Admitting: Family

## 2016-02-17 DIAGNOSIS — G4700 Insomnia, unspecified: Secondary | ICD-10-CM

## 2016-02-17 NOTE — Telephone Encounter (Signed)
This patient should not be listed as mine, I saw her once for sinus infection and then she has seen [place] every time for her maintenance issues. Please reassigned to PCP to [facility] and forward this request to her

## 2016-02-22 ENCOUNTER — Ambulatory Visit (INDEPENDENT_AMBULATORY_CARE_PROVIDER_SITE_OTHER): Payer: Medicare Other | Admitting: Family

## 2016-02-22 ENCOUNTER — Encounter: Payer: Self-pay | Admitting: Family

## 2016-02-22 VITALS — BP 135/79 | HR 90 | Temp 98.0°F | Ht 61.0 in | Wt 122.0 lb

## 2016-02-22 DIAGNOSIS — L259 Unspecified contact dermatitis, unspecified cause: Secondary | ICD-10-CM | POA: Diagnosis not present

## 2016-02-22 DIAGNOSIS — F411 Generalized anxiety disorder: Secondary | ICD-10-CM | POA: Diagnosis not present

## 2016-02-22 DIAGNOSIS — R05 Cough: Secondary | ICD-10-CM | POA: Diagnosis not present

## 2016-02-22 DIAGNOSIS — R059 Cough, unspecified: Secondary | ICD-10-CM

## 2016-02-22 MED ORDER — VORTIOXETINE HBR 10 MG PO TABS: 10.0000 mg | ORAL_TABLET | Freq: Every day | ORAL | 1 refills | Status: DC

## 2016-02-22 MED ORDER — BENZONATATE 200 MG PO CAPS: 200.0000 mg | ORAL_CAPSULE | Freq: Three times a day (TID) | ORAL | 1 refills | Status: DC | PRN

## 2016-02-22 MED ORDER — PREDNISONE 10 MG (21) PO TBPK: ORAL_TABLET | ORAL | 0 refills | Status: DC

## 2016-02-22 MED ORDER — TRAMADOL HCL 50 MG PO TABS: 50.0000 mg | ORAL_TABLET | Freq: Four times a day (QID) | ORAL | 3 refills | Status: DC | PRN

## 2016-02-22 MED ORDER — HYDROCODONE-HOMATROPINE 5-1.5 MG/5ML PO SYRP
5.0000 mL | ORAL_SOLUTION | Freq: Three times a day (TID) | ORAL | 0 refills | Status: DC | PRN
Start: 2016-02-22 — End: 2016-03-13

## 2016-02-22 MED ORDER — TRIAMCINOLONE ACETONIDE 0.025 % EX OINT: 1.0000 "application " | TOPICAL_OINTMENT | Freq: Two times a day (BID) | CUTANEOUS | 0 refills | Status: DC

## 2016-02-22 NOTE — Patient Instructions (Signed)
Contact Dermatitis Dermatitis is redness, soreness, and swelling (inflammation) of the skin. Contact dermatitis is a reaction to certain substances that touch the skin. There are two types of contact dermatitis:   Irritant contact dermatitis. This type is caused by something that irritates your skin, such as dry hands from washing them too much. This type does not require previous exposure to the substance for a reaction to occur. This type is more common.  Allergic contact dermatitis. This type is caused by a substance that you are allergic to, such as a nickel allergy or poison ivy. This type only occurs if you have been exposed to the substance (allergen) before. Upon a repeat exposure, your body reacts to the substance. This type is less common. CAUSES  Many different substances can cause contact dermatitis. Irritant contact dermatitis is most commonly caused by exposure to:   Makeup.   Soaps.   Detergents.   Bleaches.   Acids.   Metal salts, such as nickel.  Allergic contact dermatitis is most commonly caused by exposure to:   Poisonous plants.   Chemicals.   Jewelry.   Latex.   Medicines.   Preservatives in products, such as clothing.  RISK FACTORS This condition is more likely to develop in:   People who have jobs that expose them to irritants or allergens.  People who have certain medical conditions, such as asthma or eczema.  SYMPTOMS  Symptoms of this condition may occur anywhere on your body where the irritant has touched you or is touched by you. Symptoms include:  Dryness or flaking.   Redness.   Cracks.   Itching.   Pain or a burning feeling.   Blisters.  Drainage of small amounts of blood or clear fluid from skin cracks. With allergic contact dermatitis, there may also be swelling in areas such as the eyelids, mouth, or genitals.  DIAGNOSIS  This condition is diagnosed with a medical history and physical exam. A patch skin test  may be performed to help determine the cause. If the condition is related to your job, you may need to see an occupational medicine specialist. TREATMENT Treatment for this condition includes figuring out what caused the reaction and protecting your skin from further contact. Treatment may also include:   Steroid creams or ointments. Oral steroid medicines may be needed in more severe cases.  Antibiotics or antibacterial ointments, if a skin infection is present.  Antihistamine lotion or an antihistamine taken by mouth to ease itching.  A bandage (dressing). HOME CARE INSTRUCTIONS Skin Care  Moisturize your skin as needed.   Apply cool compresses to the affected areas.  Try taking a bath with:  Epsom salts. Follow the instructions on the packaging. You can get these at your local pharmacy or grocery store.  Baking soda. Pour a small amount into the bath as directed by your health care provider.  Colloidal oatmeal. Follow the instructions on the packaging. You can get this at your local pharmacy or grocery store.  Try applying baking soda paste to your skin. Stir water into baking soda until it reaches a paste-like consistency.  Do not scratch your skin.  Bathe less frequently, such as every other day.  Bathe in lukewarm water. Avoid using hot water. Medicines  Take or apply over-the-counter and prescription medicines only as told by your health care provider.   If you were prescribed an antibiotic medicine, take or apply your antibiotic as told by your health care provider. Do not stop using the   antibiotic even if your condition starts to improve. General Instructions  Keep all follow-up visits as told by your health care provider. This is important.  Avoid the substance that caused your reaction. If you do not know what caused it, keep a journal to try to track what caused it. Write down:  What you eat.  What cosmetic products you use.  What you drink.  What  you wear in the affected area. This includes jewelry.  If you were given a dressing, take care of it as told by your health care provider. This includes when to change and remove it. SEEK MEDICAL CARE IF:   Your condition does not improve with treatment.  Your condition gets worse.  You have signs of infection such as swelling, tenderness, redness, soreness, or warmth in the affected area.  You have a fever.  You have new symptoms. SEEK IMMEDIATE MEDICAL CARE IF:   You have a severe headache, neck pain, or neck stiffness.  You vomit.  You feel very sleepy.  You notice red streaks coming from the affected area.  Your bone or joint underneath the affected area becomes painful after the skin has healed.  The affected area turns darker.  You have difficulty breathing.   This information is not intended to replace advice given to you by your health care provider. Make sure you discuss any questions you have with your health care provider.   Document Released: 06/16/2000 Document Revised: 03/10/2015 Document Reviewed: 11/04/2014 Elsevier Interactive Patient Education 2016 Elsevier Inc.  

## 2016-02-22 NOTE — Progress Notes (Addendum)
Subjective:    Patient ID: Stacy Dalton, female    DOB: [birth year], 65 y.o.   MRN: [id number]  PT presents to the office today with a rash that started a few weeks ago. PT is also complaining of GAD. PT was taking Cymbalta, but has stopped it. Pt states that since stopping it she can't tell a difference. PT states she feels sad, overwhelmed, grief, and hopelessness. Pt is complaining of a cough that is worse at night. PT requesting cough medicine today.  Rash  This is a new problem. The current episode started more than 1 month ago. The problem has been waxing and waning since onset. The affected locations include the chest and face. The rash is characterized by itchiness and redness. She was exposed to nothing. Associated symptoms include coughing. Pertinent negatives include no congestion, diarrhea, fatigue, fever, rhinorrhea, shortness of breath, sore throat or vomiting. Past treatments include anti-itch cream, moisturizer and topical steroids. The treatment provided mild relief.      Review of Systems  Constitutional: Negative for fatigue and fever.  HENT: Negative for congestion, rhinorrhea and sore throat.   Respiratory: Positive for cough. Negative for shortness of breath.   Gastrointestinal: Negative for diarrhea and vomiting.  Skin: Positive for rash.  All other systems reviewed and are negative.      Objective:   Physical Exam  Constitutional: She is oriented to person, place, and time. She appears well-developed and well-nourished. No distress.  HENT:  Head: Normocephalic and atraumatic.  Right Ear: External ear normal.  Eyes: Pupils are equal, round, and reactive to light.  Neck: Normal range of motion. Neck supple. No thyromegaly present.  Cardiovascular: Normal rate, regular rhythm, normal heart sounds and intact distal pulses.   No murmur heard. Pulmonary/Chest: Effort normal and breath sounds normal. No respiratory distress. She has no wheezes.  Abdominal: Soft.  Bowel sounds are normal. She exhibits no distension. There is no tenderness.  Musculoskeletal: Normal range of motion. She exhibits no edema or tenderness.  Neurological: She is alert and oriented to person, place, and time. She has normal reflexes. No cranial nerve deficit.  Skin: Skin is warm and dry. Rash noted.  Generalized erythemas on face, neck, and  chest. Erythemas on present on outline of clothing that would be exposed to sun.   Psychiatric: She has a normal mood and affect. Her behavior is normal. Judgment and thought content normal.  Vitals reviewed.     BP 135/79   Pulse 90   Temp 98 F (36.7 C) (Oral)   Ht 5\' 1"  (1.549 m)   Wt 122 lb (55.3 kg)   BMI 23.05 kg/m      Assessment & Plan:  1. Contact dermatitis Do not scratch Discussed avoid the sun at this time since rash appears to be in area that is exposed to the sun Pt states she has not used any new lotions, chemicals, make-up, or laundry .  -RTO prn  - triamcinolone (KENALOG) 0.025 % ointment; Apply 1 application topically 2 (two) times daily.  Dispense: 30 g; Refill: 0 - predniSONE (STERAPRED UNI-PAK 21 TAB) 10 MG (21) TBPK tablet; Use as directed  Dispense: 21 tablet; Refill: 0  2. Cough - benzonatate (TESSALON) 200 MG capsule; Take 1 capsule (200 mg total) by mouth 3 (three) times daily as needed.  Dispense: 30 capsule; Refill: 1 - HYDROcodone-homatropine (HYCODAN) 5-1.5 MG/5ML syrup; Take 5 mLs by mouth every 8 (eight) hours as needed for cough.  Dispense: 120  mL; Refill: 0  3. GAD (generalized anxiety disorder) - vortioxetine HBr (TRINTELLIX) 10 MG TABS; Take 1 tablet (10 mg total) by mouth daily.  Dispense: 30 tablet; Refill: [place], FNP

## 2016-02-22 NOTE — Addendum Note (Signed)
Addended by: Evelina Dun A on: 02/22/2016 03:14 PM   Modules accepted: Orders

## 2016-02-29 ENCOUNTER — Ambulatory Visit: Payer: Medicare Other | Admitting: Family

## 2016-03-01 ENCOUNTER — Encounter: Payer: Self-pay | Admitting: Family

## 2016-03-04 ENCOUNTER — Other Ambulatory Visit: Payer: Self-pay | Admitting: Family

## 2016-03-07 ENCOUNTER — Other Ambulatory Visit: Payer: Self-pay | Admitting: Family

## 2016-03-07 MED ORDER — ONDANSETRON HCL 4 MG PO TABS: 4.0000 mg | ORAL_TABLET | Freq: Three times a day (TID) | ORAL | 0 refills | Status: DC | PRN

## 2016-03-07 NOTE — Telephone Encounter (Signed)
Medication sent to [place]

## 2016-03-13 ENCOUNTER — Encounter: Payer: Self-pay | Admitting: Family

## 2016-03-13 ENCOUNTER — Ambulatory Visit (INDEPENDENT_AMBULATORY_CARE_PROVIDER_SITE_OTHER): Payer: Medicare Other | Admitting: Family

## 2016-03-13 VITALS — BP 132/71 | HR 77 | Temp 98.4°F | Ht 61.0 in | Wt 122.0 lb

## 2016-03-13 DIAGNOSIS — G8929 Other chronic pain: Secondary | ICD-10-CM | POA: Diagnosis not present

## 2016-03-13 DIAGNOSIS — F411 Generalized anxiety disorder: Secondary | ICD-10-CM

## 2016-03-13 DIAGNOSIS — N3000 Acute cystitis without hematuria: Secondary | ICD-10-CM

## 2016-03-13 DIAGNOSIS — M549 Dorsalgia, unspecified: Secondary | ICD-10-CM | POA: Diagnosis not present

## 2016-03-13 DIAGNOSIS — G894 Chronic pain syndrome: Secondary | ICD-10-CM

## 2016-03-13 DIAGNOSIS — R350 Frequency of micturition: Secondary | ICD-10-CM

## 2016-03-13 DIAGNOSIS — K651 Peritoneal abscess: Secondary | ICD-10-CM

## 2016-03-13 LAB — MICROSCOPIC EXAMINATION: RBC, UA: NONE SEEN /hpf (ref 0–?)

## 2016-03-13 LAB — URINALYSIS, COMPLETE
Bilirubin, UA: NEGATIVE
Glucose, UA: NEGATIVE
Ketones, UA: NEGATIVE
Nitrite, UA: NEGATIVE
Protein, UA: NEGATIVE
RBC, UA: NEGATIVE
Specific Gravity, UA: 1.015 (ref 1.005–1.030)
Urobilinogen, Ur: 0.2 mg/dL (ref 0.2–1.0)
pH, UA: 5.5 (ref 5.0–7.5)

## 2016-03-13 MED ORDER — HYDROCODONE-ACETAMINOPHEN 5-325 MG PO TABS: 1.0000 | ORAL_TABLET | Freq: Three times a day (TID) | ORAL | 0 refills | Status: DC | PRN

## 2016-03-13 MED ORDER — VORTIOXETINE HBR 10 MG PO TABS: 10.0000 mg | ORAL_TABLET | Freq: Every day | ORAL | 1 refills | Status: DC

## 2016-03-13 MED ORDER — SULFAMETHOXAZOLE-TRIMETHOPRIM 800-160 MG PO TABS: 1.0000 | ORAL_TABLET | Freq: Two times a day (BID) | ORAL | 0 refills | Status: DC

## 2016-03-13 NOTE — Progress Notes (Signed)
Subjective:    Patient ID: Stacy Dalton, female    DOB: [birth year], 65 y.o.   MRN: [id number]  Pt presents to the office today for urinary frequency. Pt also states she went to the pharmacy to pick up Trintellix for her GAD it was $50. Pt states her anxiety is increased and would like to try to get the [facility] to check the price. Pt states her pain is not controlled at this time. Pt states her back pain and abdomen pain continues 8 out 10. PT states the ultram is not helping like it had. Pt states she has surgery planned in the next few weeks for her ostomy reversed.  Urinary Frequency   This is a new problem. The current episode started in the past 7 days. The problem occurs intermittently. The problem has been unchanged. The quality of the pain is described as burning. The pain is at a severity of 7/10. The pain is mild. Associated symptoms include flank pain, frequency, nausea and urgency. Pertinent negatives include no discharge, hematuria or vomiting. She has tried increased fluids for the symptoms. The treatment provided mild relief.      Review of Systems  Gastrointestinal: Positive for nausea. Negative for vomiting.  Genitourinary: Positive for flank pain, frequency and urgency. Negative for hematuria.  All other systems reviewed and are negative.      Objective:   Physical Exam  Constitutional: She is oriented to person, place, and time. She appears well-developed and well-nourished. No distress.  HENT:  Head: Normocephalic and atraumatic.  Eyes: Pupils are equal, round, and reactive to light.  Neck: Normal range of motion. Neck supple. No thyromegaly present.  Cardiovascular: Normal rate, regular rhythm and intact distal pulses.  Exam reveals no friction rub.   No murmur heard. Pulmonary/Chest: Effort normal and breath sounds normal. No respiratory distress. She has no wheezes.  Abdominal: Soft. Bowel sounds are normal. She exhibits no distension.  There is tenderness.  Musculoskeletal: Normal range of motion. She exhibits no edema or tenderness.  Neurological: She is alert and oriented to person, place, and time.  Skin: Skin is warm and dry.  Psychiatric: She has a normal mood and affect. Her behavior is normal. Judgment and thought content normal.  Vitals reviewed.    BP 132/71   Pulse 77   Temp 98.4 F (36.9 C) (Oral)   Ht 5\' 1"  (1.549 m)   Wt 122 lb (55.3 kg)   BMI 23.05 kg/m      Assessment & Plan:  1. Urine frequency - Urinalysis, Complete  2. GAD (generalized anxiety disorder) -Samples given to patient today and rx sent to mail order pharmacy Stress management discussed - vortioxetine HBr (TRINTELLIX) 10 MG TABS; Take 1 tablet (10 mg total) by mouth daily.  Dispense: 30 tablet; Refill: 1  3. Acute cystitis without hematuria -Force fluids AZO over the counter X2 days RTO prn - sulfamethoxazole-trimethoprim (BACTRIM DS) 800-160 MG tablet; Take 1 tablet by mouth 2 (two) times daily.  Dispense: 14 tablet; Refill: 0  4. Peritonitis with abscess of intestine (HCC) - HYDROcodone-acetaminophen (NORCO/VICODIN) 5-325 MG tablet; Take 1 tablet by mouth every 8 (eight) hours as needed for moderate pain.  Dispense: 90 tablet; Refill: 0  5. Chronic back pain - HYDROcodone-acetaminophen (NORCO/VICODIN) 5-325 MG tablet; Take 1 tablet by mouth every 8 (eight) hours as needed for moderate pain.  Dispense: 90 tablet; Refill: 0  6. Chronic pain syndrome - HYDROcodone-acetaminophen (NORCO/VICODIN) 5-325 MG tablet;  Take 1 tablet by mouth every 8 (eight) hours as needed for moderate pain.  Dispense: 90 tablet; Refill: 0  Ultram stopped today and Norco ordered. Discussed only using as needed  Evelina Dun, FNP

## 2016-03-13 NOTE — Patient Instructions (Signed)
Asymptomatic Bacteriuria, Female Asymptomatic bacteriuria is the presence of a large number of bacteria in your urine without the usual symptoms of burning or frequent urination. The following conditions increase the risk of asymptomatic bacteriuria:  Diabetes mellitus.  Advanced age.  Pregnancy in the first trimester.  Kidney stones.  Kidney transplants.  Leaky kidney tube valve in young children (reflux). Treatment for this condition is not needed in most people and can lead to other problems such as too much yeast and growth of resistant bacteria. However, some people, such as pregnant women, do need treatment to prevent kidney infection. Asymptomatic bacteriuria in pregnancy is also associated with fetal growth restriction, premature labor, and newborn death. HOME CARE INSTRUCTIONS Monitor your condition for any changes. The following actions may help to relieve any discomfort you are feeling:  Drink enough water and fluids to keep your urine clear or pale yellow. Go to the bathroom more often to keep your bladder empty.  Keep the area around your vagina and rectum clean. Wipe yourself from front to back after urinating. SEEK IMMEDIATE MEDICAL CARE IF:  You develop signs of an infection such as:  Burning with urination.  Frequency of voiding.  Back pain.  Fever.  You have blood in the urine.  You develop a fever. MAKE SURE YOU:  Understand these instructions.  Will watch your condition.  Will get help right away if you are not doing well or get worse.   This information is not intended to replace advice given to you by your health care provider. Make sure you discuss any questions you have with your health care provider.   Document Released: 06/19/2005 Document Revised: 07/10/2014 Document Reviewed: 12/09/2012 Elsevier Interactive Patient Education 2016 Elsevier Inc.  

## 2016-03-14 ENCOUNTER — Other Ambulatory Visit: Payer: Self-pay | Admitting: Family

## 2016-03-15 ENCOUNTER — Telehealth: Payer: Self-pay | Admitting: Family

## 2016-03-15 NOTE — Telephone Encounter (Signed)
She can go back to her old one if she likes but if she sticks with the new one for at least a week often times the symptoms will go away. This is up to her but she can call back with whatever decision she makes and please send the inflow to [place] so that she knows.

## 2016-03-15 NOTE — Telephone Encounter (Signed)
Pt says she can not toletrate Trintellix Wants to go back on cymbalta Please send new script to [facility]

## 2016-03-15 NOTE — Telephone Encounter (Signed)
Left message that symptoms from new medication may go away in about a week but if she prefers she can go back on old medicine . Please call with your decision.

## 2016-03-16 ENCOUNTER — Telehealth: Payer: Self-pay | Admitting: Family

## 2016-03-16 ENCOUNTER — Other Ambulatory Visit: Payer: Self-pay | Admitting: Family

## 2016-03-16 DIAGNOSIS — F32A Depression, unspecified: Secondary | ICD-10-CM

## 2016-03-16 DIAGNOSIS — F329 Major depressive disorder, single episode, unspecified: Secondary | ICD-10-CM

## 2016-03-16 MED ORDER — ALPRAZOLAM 0.5 MG PO TABS: 0.5000 mg | ORAL_TABLET | Freq: Every day | ORAL | 3 refills | Status: DC | PRN

## 2016-03-28 ENCOUNTER — Ambulatory Visit (INDEPENDENT_AMBULATORY_CARE_PROVIDER_SITE_OTHER): Payer: Medicare Other | Admitting: Family

## 2016-03-28 ENCOUNTER — Encounter: Payer: Self-pay | Admitting: Family

## 2016-03-28 VITALS — BP 123/75 | HR 102 | Temp 97.4°F | Ht 61.0 in | Wt 118.0 lb

## 2016-03-28 DIAGNOSIS — N3000 Acute cystitis without hematuria: Secondary | ICD-10-CM

## 2016-03-28 DIAGNOSIS — R300 Dysuria: Secondary | ICD-10-CM | POA: Diagnosis not present

## 2016-03-28 LAB — MICROSCOPIC EXAMINATION: WBC, UA: >30 /hpf — AB (ref 0–?)

## 2016-03-28 LAB — URINALYSIS, COMPLETE
Bilirubin, UA: NEGATIVE
Glucose, UA: NEGATIVE
Ketones, UA: NEGATIVE
Nitrite, UA: NEGATIVE
Protein, UA: NEGATIVE
RBC, UA: NEGATIVE
Specific Gravity, UA: 1.01 (ref 1.005–1.030)
Urobilinogen, Ur: 0.2 mg/dL (ref 0.2–1.0)
pH, UA: 6 (ref 5.0–7.5)

## 2016-03-28 MED ORDER — HYDROCODONE-HOMATROPINE 5-1.5 MG/5ML PO SYRP: 5.0000 mL | ORAL_SOLUTION | Freq: Three times a day (TID) | ORAL | 0 refills | Status: DC | PRN

## 2016-03-28 MED ORDER — CIPROFLOXACIN HCL 500 MG PO TABS: 500.0000 mg | ORAL_TABLET | Freq: Two times a day (BID) | ORAL | 0 refills | Status: DC

## 2016-03-28 NOTE — Progress Notes (Signed)
Scheduling for pre op- please place SURGICAL ORDERS IN EPIC  thanks

## 2016-03-28 NOTE — Progress Notes (Signed)
   Subjective:    Patient ID: Stacy Dalton, female    DOB: 07-01-1951, 65 y.o.   MRN: [id number]  Dysuria   This is a recurrent problem. The current episode started 1 to 4 weeks ago. The problem occurs intermittently. The problem has been waxing and waning. The quality of the pain is described as burning. The pain is at a severity of 2/10. The pain is mild. There has been no fever. Associated symptoms include hesitancy. Pertinent negatives include no chills, discharge, frequency, hematuria, nausea, urgency or vomiting. She has tried increased fluids and antibiotics for the symptoms. The treatment provided mild relief. Her past medical history is significant for recurrent UTIs.      Review of Systems  Constitutional: Negative for chills.  Gastrointestinal: Negative for nausea and vomiting.  Genitourinary: Positive for dysuria and hesitancy. Negative for frequency, hematuria and urgency.  All other systems reviewed and are negative.      Objective:   Physical Exam  Constitutional: She is oriented to person, place, and time. She appears well-developed and well-nourished. No distress.  HENT:  Head: Normocephalic.  Nasal passage erythemas with mild swelling   Eyes: Pupils are equal, round, and reactive to light.  Neck: Normal range of motion. Neck supple. No thyromegaly present.  Cardiovascular: Normal rate, regular rhythm, normal heart sounds and intact distal pulses.   No murmur heard. Pulmonary/Chest: Effort normal and breath sounds normal. No respiratory distress. She has no wheezes.  Abdominal: Soft. Bowel sounds are normal. She exhibits no distension. There is no tenderness.  Musculoskeletal: Normal range of motion. She exhibits no edema or tenderness.  Neurological: She is alert and oriented to person, place, and time.  Skin: Skin is warm and dry.  Psychiatric: She has a normal mood and affect. Her behavior is normal. Judgment and thought content normal.  Vitals  reviewed.     BP 123/75   Pulse (!) 102   Temp 97.4 F (36.3 C) (Oral)   Ht 5\' 1"  (1.549 m)   Wt 118 lb (53.5 kg)   BMI 22.30 kg/m      Assessment & Plan:  1. Dysuria - Urinalysis, Complete  2. Acute cystitis without hematuria -Force fluids AZO over the counter X2 days RTO prn Culture pending - ciprofloxacin (CIPRO) 500 MG tablet; Take 1 tablet (500 mg total) by mouth 2 (two) times daily.  Dispense: 10 tablet; Refill: 0  Evelina Dun, FNP

## 2016-03-28 NOTE — Patient Instructions (Signed)
Asymptomatic Bacteriuria, Female Asymptomatic bacteriuria is the presence of a large number of bacteria in your urine without the usual symptoms of burning or frequent urination. The following conditions increase the risk of asymptomatic bacteriuria:  Diabetes mellitus.  Advanced age.  Pregnancy in the first trimester.  Kidney stones.  Kidney transplants.  Leaky kidney tube valve in young children (reflux). Treatment for this condition is not needed in most people and can lead to other problems such as too much yeast and growth of resistant bacteria. However, some people, such as pregnant women, do need treatment to prevent kidney infection. Asymptomatic bacteriuria in pregnancy is also associated with fetal growth restriction, premature labor, and newborn death. HOME CARE INSTRUCTIONS Monitor your condition for any changes. The following actions may help to relieve any discomfort you are feeling:  Drink enough water and fluids to keep your urine clear or pale yellow. Go to the bathroom more often to keep your bladder empty.  Keep the area around your vagina and rectum clean. Wipe yourself from front to back after urinating. SEEK IMMEDIATE MEDICAL CARE IF:  You develop signs of an infection such as:  Burning with urination.  Frequency of voiding.  Back pain.  Fever.  You have blood in the urine.  You develop a fever. MAKE SURE YOU:  Understand these instructions.  Will watch your condition.  Will get help right away if you are not doing well or get worse.   This information is not intended to replace advice given to you by your health care provider. Make sure you discuss any questions you have with your health care provider.   Document Released: 06/19/2005 Document Revised: 07/10/2014 Document Reviewed: 12/09/2012 Elsevier Interactive Patient Education 2016 Elsevier Inc.  

## 2016-04-03 ENCOUNTER — Ambulatory Visit: Payer: Self-pay | Admitting: Surgery

## 2016-04-05 ENCOUNTER — Encounter (HOSPITAL_COMMUNITY)
Admission: RE | Admit: 2016-04-05 | Discharge: 2016-04-05 | Disposition: A | Discharge status: Home / Self Care | Payer: Medicare Other | Source: Ambulatory Visit | Attending: Surgery | Admitting: Surgery

## 2016-04-05 ENCOUNTER — Encounter (HOSPITAL_COMMUNITY): Payer: Self-pay

## 2016-04-05 DIAGNOSIS — Z01812 Encounter for preprocedural laboratory examination: Secondary | ICD-10-CM | POA: Insufficient documentation

## 2016-04-05 DIAGNOSIS — Z433 Encounter for attention to colostomy: Secondary | ICD-10-CM | POA: Insufficient documentation

## 2016-04-05 LAB — CBC
HCT: 41.5 % (ref 36.0–46.0)
Hemoglobin: 13.6 g/dL (ref 12.0–15.0)
MCH: 27.6 pg (ref 26.0–34.0)
MCHC: 32.8 g/dL (ref 30.0–36.0)
MCV: 84.2 fL (ref 78.0–100.0)
Platelets: 292 10*3/uL (ref 150–400)
RBC: 4.93 MIL/uL (ref 3.87–5.11)
RDW: 13.9 % (ref 11.5–15.5)
WBC: 5.9 10*3/uL (ref 4.0–10.5)

## 2016-04-05 LAB — BASIC METABOLIC PANEL
Anion gap: 10 (ref 5–15)
BUN: 8 mg/dL (ref 6–20)
CO2: 33 mmol/L — ABNORMAL HIGH (ref 22–32)
Calcium: 10.1 mg/dL (ref 8.9–10.3)
Chloride: 95 mmol/L — ABNORMAL LOW (ref 101–111)
Creatinine, Ser: 0.86 mg/dL (ref 0.44–1.00)
GFR calc Af Amer: >60 mL/min (ref >60)
GFR calc non Af Amer: >60 mL/min (ref >60)
Glucose, Bld: 100 mg/dL — ABNORMAL HIGH (ref 65–99)
Potassium: 3.5 mmol/L (ref 3.5–5.1)
Sodium: 138 mmol/L (ref 135–145)

## 2016-04-05 NOTE — Pre-Procedure Instructions (Signed)
EKG 5'17, CXR 04-28-15 Epic.

## 2016-04-05 NOTE — Patient Instructions (Addendum)
Stacy Dalton  04/05/2016   Your procedure is scheduled on: 04-12-16  Report to [facility] Main  Entrance take [facility]  elevators to 3rd floor to  [place] at  0800 AM.  Call this number if you have problems the morning of surgery [phone] Follow any bowel prep instructions(drink clear liquids plentiful day before)   CLEAR LIQUID DIET   Foods Allowed                                                                     Foods Excluded  Coffee and tea, regular and decaf                             liquids that you cannot  Plain Jell-O in any flavor                                             see through such as: Fruit ices (not with fruit pulp)                                     milk, soups, orange juice  Iced Popsicles                                    All solid food Carbonated beverages, regular and diet                                    Cranberry, grape and apple juices Sports drinks like Gatorade Lightly seasoned clear broth or consume(fat free) Sugar, honey syrup  _____________________________________________________________________    Remember: ONLY 1 PERSON MAY GO WITH YOU TO SHORT STAY TO GET  READY MORNING OF YOUR SURGERY.  Do not eat food or drink liquids :After Midnight.     Take these medicines the morning of surgery with A SIP OF WATER: Alprazolam. Tramadol. Levothyroxine. Use Inhalers-usual. DO NOT TAKE ANY DIABETIC MEDICATIONS DAY OF YOUR SURGERY                               You may not have any metal on your body including hair pins and              piercings  Do not wear jewelry, make-up, lotions, powders or perfumes, deodorant             Do not wear nail polish.  Do not shave  48 hours prior to surgery.              Men may shave face and neck.   Do not bring valuables to the hospital. [place]  FOR VALUABLES.  Contacts, dentures or bridgework may not be worn into surgery.  Leave  suitcase in the car. After surgery it may be brought to your room.     Patients discharged the day of surgery will not be allowed to drive home.  Name and phone number of your driver: Abbey Chatters [phone] cell  Special Instructions: N/A              Please read over the following fact sheets you were given: _____________________________________________________________________             [facility] - Preparing for Surgery Before surgery, you can play an important role.  Because skin is not sterile, your skin needs to be as free of germs as possible.  You can reduce the number of germs on your skin by washing with CHG (chlorahexidine gluconate) soap before surgery.  CHG is an antiseptic cleaner which kills germs and bonds with the skin to continue killing germs even after washing. Please DO NOT use if you have an allergy to CHG or antibacterial soaps.  If your skin becomes reddened/irritated stop using the CHG and inform your nurse when you arrive at Short Stay. Do not shave (including legs and underarms) for at least 48 hours prior to the first CHG shower.  You may shave your face/neck. Please follow these instructions carefully:  1.  Shower with CHG Soap the night before surgery and the  morning of Surgery.  2.  If you choose to wash your hair, wash your hair first as usual with your  normal  shampoo.  3.  After you shampoo, rinse your hair and body thoroughly to remove the  shampoo.                           4.  Use CHG as you would any other liquid soap.  You can apply chg directly  to the skin and wash                       Gently with a scrungie or clean washcloth.  5.  Apply the CHG Soap to your body ONLY FROM THE NECK DOWN.   Do not use on face/ open                           Wound or open sores. Avoid contact with eyes, ears mouth and genitals (private parts).                       Wash face,  Genitals (private parts) with your normal soap.             6.  Wash thoroughly,  paying special attention to the area where your surgery  will be performed.  7.  Thoroughly rinse your body with warm water from the neck down.  8.  DO NOT shower/wash with your normal soap after using and rinsing off  the CHG Soap.                9.  Pat yourself dry with a clean towel.            10.  Wear clean pajamas.            11.  Place clean sheets on your bed the night of your first shower and do not  sleep with pets. Day of Surgery :  Do not apply any lotions/deodorants the morning of surgery.  Please wear clean clothes to the hospital/surgery center.  FAILURE TO FOLLOW THESE INSTRUCTIONS MAY RESULT IN THE CANCELLATION OF YOUR SURGERY PATIENT SIGNATURE_________________________________  NURSE SIGNATURE__________________________________  ________________________________________________________________________

## 2016-04-06 ENCOUNTER — Other Ambulatory Visit (HOSPITAL_COMMUNITY)

## 2016-04-06 LAB — HEMOGLOBIN A1C
Hgb A1c MFr Bld: 5.7 % — ABNORMAL HIGH (ref 4.8–5.6)
Mean Plasma Glucose: 117 mg/dL

## 2016-04-11 NOTE — H&P (Signed)
Stacy Dalton [place] Location: [facility] Surgery Patient #: [id number] DOB: [birth year] Widowed / Language: Cleophus Molt / Race: White Female   History of Present Illness  Patient words: post-op colectomy-want ostomy takendown This 65 year old WF had subtotal colectomy in May for second stercoral perforation. She is anxious to have her colostomy taken down. Her incision has healed and she has a bagged ostomy in the right lower quadrant. I left prolene sutures on her [place] and she has an ascending colostomy. Will set this up in October.  She is aware of the risks of takedown and wants to move ahead asap.        Allergies Iodine (Antiseptic) *ANTISEPTICS & DISINFECTANTS* Demerol *ANALGESICS - OPIOID* Ketorolac Tromethamine *ANALGESICS - ANTI-INFLAMMATORY* Butal/Apap/Caff *ANALGESICS - NonNarcotic*  Medication History Norco (5-325MG  Tablet, 1 (one) Tablet Oral four times daily, as needed, Taken starting 02/10/2016) Active. Zolpidem Tartrate ER (12.5MG  Tablet ER, Oral) Active. Hydrocodone-Acetaminophen (5-325MG  Tablet, Oral) Active. Levothyroxine Sodium (50MCG Tablet, Oral) Active. Fluticasone Propionate (Nasal) (50MCG/ACT Suspension, Nasal) Active. DULoxetine HCl (60MG  Capsule DR Part, Oral) Active. ALPRAZolam (0.5MG  Tablet, Oral) Active. TraMADol HCl (50MG  Tablet, Oral) Active. Advair Diskus (100-50MCG/DOSE Aero Pow Br Act, Inhalation) Active. Atorvastatin Calcium (40MG  Tablet, Oral) Active. Triamterene-HCTZ (37.5-25MG  Capsule, Oral) Active. Combivent Respimat (20-100MCG/ACT Aerosol Soln, Inhalation) Active. Ondansetron HCl (4MG  Tablet, Oral) Active. MetroNIDAZOLE (500MG  Tablet, Oral) Active. Sulfamethoxazole-Trimethoprim (800-160MG  Tablet, Oral) Active. Methocarbamol (500MG  Tablet, Oral) Active. Medications Reconciled  Vitals  Weight: 122.2 lb Height: 61in Body Surface Area: 1.53 m Body Mass Index: 23.09 kg/m  Temp.: 98.51F(Oral)  Pulse: 89  (Regular)  BP: 130/80 (Sitting, Left Arm, Standard)       Physical Exam The physical exam findings are as follows: Note:WFNAD HEENT unremarkable Neck supple Chest clear Heart SR Abdomen-midline scar and right sided colostomy Ext FROM    Assessment & Plan  COLOSTOMY IN PLACE (Z93.3) Impression: Plan takedown of colostomy

## 2016-04-12 ENCOUNTER — Inpatient Hospital Stay (HOSPITAL_COMMUNITY)
Admission: RE | Admit: 2016-04-12 | Discharge: 2016-04-18 | DRG: 346 | Disposition: A | Discharge status: Skilled Nursing Facility | Payer: Medicare Other | Source: Ambulatory Visit | Attending: Surgery | Admitting: Surgery

## 2016-04-12 ENCOUNTER — Inpatient Hospital Stay (HOSPITAL_COMMUNITY): Payer: Medicare Other | Admitting: Anesthesiology

## 2016-04-12 ENCOUNTER — Encounter (HOSPITAL_COMMUNITY): Payer: Self-pay | Admitting: Anesthesiology

## 2016-04-12 ENCOUNTER — Encounter (HOSPITAL_COMMUNITY)
Admission: RE | Disposition: A | Discharge status: Skilled Nursing Facility | Payer: Self-pay | Source: Ambulatory Visit | Attending: Surgery

## 2016-04-12 DIAGNOSIS — R41841 Cognitive communication deficit: Secondary | ICD-10-CM | POA: Diagnosis not present

## 2016-04-12 DIAGNOSIS — Z79899 Other long term (current) drug therapy: Secondary | ICD-10-CM | POA: Diagnosis not present

## 2016-04-12 DIAGNOSIS — I10 Essential (primary) hypertension: Secondary | ICD-10-CM | POA: Diagnosis present

## 2016-04-12 DIAGNOSIS — E785 Hyperlipidemia, unspecified: Secondary | ICD-10-CM | POA: Diagnosis present

## 2016-04-12 DIAGNOSIS — E039 Hypothyroidism, unspecified: Secondary | ICD-10-CM | POA: Diagnosis present

## 2016-04-12 DIAGNOSIS — F411 Generalized anxiety disorder: Secondary | ICD-10-CM | POA: Diagnosis present

## 2016-04-12 DIAGNOSIS — Z48815 Encounter for surgical aftercare following surgery on the digestive system: Secondary | ICD-10-CM | POA: Diagnosis not present

## 2016-04-12 DIAGNOSIS — Z87891 Personal history of nicotine dependence: Secondary | ICD-10-CM

## 2016-04-12 DIAGNOSIS — J449 Chronic obstructive pulmonary disease, unspecified: Secondary | ICD-10-CM | POA: Diagnosis present

## 2016-04-12 DIAGNOSIS — K631 Perforation of intestine (nontraumatic): Secondary | ICD-10-CM | POA: Diagnosis not present

## 2016-04-12 DIAGNOSIS — R1084 Generalized abdominal pain: Secondary | ICD-10-CM | POA: Diagnosis not present

## 2016-04-12 DIAGNOSIS — Z4801 Encounter for change or removal of surgical wound dressing: Secondary | ICD-10-CM | POA: Diagnosis not present

## 2016-04-12 DIAGNOSIS — F329 Major depressive disorder, single episode, unspecified: Secondary | ICD-10-CM | POA: Diagnosis present

## 2016-04-12 DIAGNOSIS — Z433 Encounter for attention to colostomy: Principal | ICD-10-CM

## 2016-04-12 DIAGNOSIS — G894 Chronic pain syndrome: Secondary | ICD-10-CM | POA: Diagnosis present

## 2016-04-12 DIAGNOSIS — Z9889 Other specified postprocedural states: Secondary | ICD-10-CM | POA: Diagnosis not present

## 2016-04-12 DIAGNOSIS — K651 Peritoneal abscess: Secondary | ICD-10-CM | POA: Diagnosis not present

## 2016-04-12 DIAGNOSIS — K219 Gastro-esophageal reflux disease without esophagitis: Secondary | ICD-10-CM | POA: Diagnosis present

## 2016-04-12 DIAGNOSIS — G4700 Insomnia, unspecified: Secondary | ICD-10-CM | POA: Diagnosis present

## 2016-04-12 DIAGNOSIS — R2689 Other abnormalities of gait and mobility: Secondary | ICD-10-CM | POA: Diagnosis not present

## 2016-04-12 DIAGNOSIS — M6281 Muscle weakness (generalized): Secondary | ICD-10-CM | POA: Diagnosis not present

## 2016-04-12 HISTORY — PX: COLOSTOMY TAKEDOWN: SHX5783

## 2016-04-12 LAB — CBC
HCT: 38.3 % (ref 36.0–46.0)
Hemoglobin: 12.9 g/dL (ref 12.0–15.0)
MCH: 27.8 pg (ref 26.0–34.0)
MCHC: 33.7 g/dL (ref 30.0–36.0)
MCV: 82.5 fL (ref 78.0–100.0)
Platelets: 280 10*3/uL (ref 150–400)
RBC: 4.64 MIL/uL (ref 3.87–5.11)
RDW: 14.1 % (ref 11.5–15.5)
WBC: 21.1 10*3/uL — ABNORMAL HIGH (ref 4.0–10.5)

## 2016-04-12 LAB — CREATININE, SERUM
Creatinine, Ser: 0.93 mg/dL (ref 0.44–1.00)
GFR calc Af Amer: >60 mL/min (ref >60)
GFR calc non Af Amer: >60 mL/min (ref >60)

## 2016-04-12 SURGERY — CLOSURE, COLOSTOMY
Anesthesia: General

## 2016-04-12 MED ORDER — HEPARIN SODIUM (PORCINE) 5000 UNIT/ML IJ SOLN
5000.0000 [IU] | Freq: Once | INTRAMUSCULAR | Status: AC
  Administered 2016-04-12: 5000 [IU] via SUBCUTANEOUS
  Filled 2016-04-12: qty 1

## 2016-04-12 MED ORDER — MORPHINE SULFATE (PF) 2 MG/ML IV SOLN
2.0000 mg | INTRAVENOUS | Status: DC | PRN
  Administered 2016-04-12 (×3): 2 mg via INTRAVENOUS
  Filled 2016-04-12 (×3): qty 1

## 2016-04-12 MED ORDER — IPRATROPIUM-ALBUTEROL 20-100 MCG/ACT IN AERS: 1.0000 | INHALATION_SPRAY | Freq: Four times a day (QID) | RESPIRATORY_TRACT | Status: DC | PRN

## 2016-04-12 MED ORDER — IPRATROPIUM-ALBUTEROL 0.5-2.5 (3) MG/3ML IN SOLN: 3.0000 mL | Freq: Four times a day (QID) | RESPIRATORY_TRACT | Status: DC | PRN

## 2016-04-12 MED ORDER — FENTANYL CITRATE (PF) 100 MCG/2ML IJ SOLN
25.0000 ug | INTRAMUSCULAR | Status: DC | PRN
  Administered 2016-04-12 (×3): 50 ug via INTRAVENOUS

## 2016-04-12 MED ORDER — FENTANYL CITRATE (PF) 100 MCG/2ML IJ SOLN
INTRAMUSCULAR | Status: AC
  Administered 2016-04-12: 50 ug via INTRAVENOUS
  Filled 2016-04-12: qty 2

## 2016-04-12 MED ORDER — LIDOCAINE 2% (20 MG/ML) 5 ML SYRINGE
INTRAMUSCULAR | Status: AC
  Filled 2016-04-12: qty 5

## 2016-04-12 MED ORDER — DIPHENHYDRAMINE HCL 50 MG/ML IJ SOLN: 12.5000 mg | Freq: Four times a day (QID) | INTRAMUSCULAR | Status: DC | PRN

## 2016-04-12 MED ORDER — EPHEDRINE SULFATE 50 MG/ML IJ SOLN
INTRAMUSCULAR | Status: DC | PRN
  Administered 2016-04-12 (×3): 5 mg via INTRAVENOUS

## 2016-04-12 MED ORDER — METHOCARBAMOL 1000 MG/10ML IJ SOLN
1000.0000 mg | Freq: Four times a day (QID) | INTRAVENOUS | Status: DC | PRN
  Filled 2016-04-12: qty 10

## 2016-04-12 MED ORDER — DEXAMETHASONE SODIUM PHOSPHATE 10 MG/ML IJ SOLN
INTRAMUSCULAR | Status: DC | PRN
  Administered 2016-04-12: 8 mg via INTRAVENOUS

## 2016-04-12 MED ORDER — FENTANYL CITRATE (PF) 100 MCG/2ML IJ SOLN
INTRAMUSCULAR | Status: DC | PRN
  Administered 2016-04-12 (×5): 50 ug via INTRAVENOUS

## 2016-04-12 MED ORDER — FLUTICASONE PROPIONATE 50 MCG/ACT NA SUSP
2.0000 | Freq: Every day | NASAL | Status: DC
  Filled 2016-04-12: qty 16

## 2016-04-12 MED ORDER — ZOLPIDEM TARTRATE 5 MG PO TABS: 5.0000 mg | ORAL_TABLET | Freq: Every evening | ORAL | Status: DC | PRN

## 2016-04-12 MED ORDER — PROMETHAZINE HCL 25 MG/ML IJ SOLN
6.2500 mg | INTRAMUSCULAR | Status: DC | PRN
  Administered 2016-04-12: 6.25 mg via INTRAVENOUS

## 2016-04-12 MED ORDER — HEPARIN SODIUM (PORCINE) 5000 UNIT/ML IJ SOLN
5000.0000 [IU] | Freq: Three times a day (TID) | INTRAMUSCULAR | Status: DC
  Administered 2016-04-12 – 2016-04-18 (×17): 5000 [IU] via SUBCUTANEOUS
  Filled 2016-04-12 (×16): qty 1

## 2016-04-12 MED ORDER — ONDANSETRON HCL 4 MG/2ML IJ SOLN
INTRAMUSCULAR | Status: AC
  Filled 2016-04-12: qty 2

## 2016-04-12 MED ORDER — KETAMINE HCL 10 MG/ML IJ SOLN
INTRAMUSCULAR | Status: DC | PRN
  Administered 2016-04-12: 20 mg via INTRAVENOUS
  Administered 2016-04-12: 10 mg via INTRAVENOUS

## 2016-04-12 MED ORDER — KETAMINE HCL 10 MG/ML IJ SOLN
INTRAMUSCULAR | Status: AC
  Filled 2016-04-12: qty 1

## 2016-04-12 MED ORDER — DEXTROSE 5 % IV SOLN
2.0000 g | Freq: Two times a day (BID) | INTRAVENOUS | Status: AC
  Administered 2016-04-12: 2 g via INTRAVENOUS
  Filled 2016-04-12 (×2): qty 2

## 2016-04-12 MED ORDER — LEVOTHYROXINE SODIUM 50 MCG PO TABS
50.0000 ug | ORAL_TABLET | Freq: Every day | ORAL | Status: DC
  Administered 2016-04-13 – 2016-04-18 (×6): 50 ug via ORAL
  Filled 2016-04-12 (×6): qty 1

## 2016-04-12 MED ORDER — HYDROMORPHONE HCL 1 MG/ML IJ SOLN
0.5000 mg | INTRAMUSCULAR | Status: DC | PRN
  Administered 2016-04-12 – 2016-04-13 (×5): 1 mg via INTRAVENOUS
  Filled 2016-04-12 (×5): qty 1

## 2016-04-12 MED ORDER — ONDANSETRON HCL 4 MG/2ML IJ SOLN
4.0000 mg | Freq: Four times a day (QID) | INTRAMUSCULAR | Status: DC | PRN
  Administered 2016-04-12 – 2016-04-16 (×5): 4 mg via INTRAVENOUS
  Filled 2016-04-12 (×5): qty 2

## 2016-04-12 MED ORDER — SACCHAROMYCES BOULARDII 250 MG PO CAPS
250.0000 mg | ORAL_CAPSULE | Freq: Two times a day (BID) | ORAL | Status: DC
  Administered 2016-04-12 – 2016-04-18 (×12): 250 mg via ORAL
  Filled 2016-04-12 (×12): qty 1

## 2016-04-12 MED ORDER — METOPROLOL TARTRATE 5 MG/5ML IV SOLN: 5.0000 mg | Freq: Four times a day (QID) | INTRAVENOUS | Status: DC | PRN

## 2016-04-12 MED ORDER — LIDOCAINE 2% (20 MG/ML) 5 ML SYRINGE
INTRAMUSCULAR | Status: DC | PRN
  Administered 2016-04-12: 1.5 mg/kg/h via INTRAVENOUS

## 2016-04-12 MED ORDER — DIPHENHYDRAMINE HCL 12.5 MG/5ML PO ELIX: 12.5000 mg | ORAL_SOLUTION | Freq: Four times a day (QID) | ORAL | Status: DC | PRN

## 2016-04-12 MED ORDER — SUCCINYLCHOLINE CHLORIDE 200 MG/10ML IV SOSY
PREFILLED_SYRINGE | INTRAVENOUS | Status: DC | PRN
  Administered 2016-04-12: 100 mg via INTRAVENOUS

## 2016-04-12 MED ORDER — ONDANSETRON HCL 4 MG PO TABS: 4.0000 mg | ORAL_TABLET | Freq: Four times a day (QID) | ORAL | Status: DC | PRN

## 2016-04-12 MED ORDER — CEFOTETAN DISODIUM-DEXTROSE 2-2.08 GM-% IV SOLR
INTRAVENOUS | Status: AC
  Filled 2016-04-12: qty 50

## 2016-04-12 MED ORDER — PROMETHAZINE HCL 25 MG/ML IJ SOLN
INTRAMUSCULAR | Status: AC
  Administered 2016-04-12: 6.25 mg via INTRAVENOUS
  Filled 2016-04-12: qty 1

## 2016-04-12 MED ORDER — CELECOXIB 200 MG PO CAPS
400.0000 mg | ORAL_CAPSULE | ORAL | Status: AC
  Administered 2016-04-12: 400 mg via ORAL
  Filled 2016-04-12: qty 2

## 2016-04-12 MED ORDER — EPHEDRINE 5 MG/ML INJ
INTRAVENOUS | Status: AC
  Filled 2016-04-12: qty 10

## 2016-04-12 MED ORDER — ONDANSETRON HCL 4 MG/2ML IJ SOLN
INTRAMUSCULAR | Status: DC | PRN
  Administered 2016-04-12 (×2): 4 mg via INTRAVENOUS

## 2016-04-12 MED ORDER — MIDAZOLAM HCL 2 MG/2ML IJ SOLN
INTRAMUSCULAR | Status: AC
  Filled 2016-04-12: qty 2

## 2016-04-12 MED ORDER — AMLODIPINE BESYLATE 10 MG PO TABS
10.0000 mg | ORAL_TABLET | Freq: Once | ORAL | Status: AC
  Administered 2016-04-12: 10 mg via ORAL
  Filled 2016-04-12: qty 1

## 2016-04-12 MED ORDER — PROPOFOL 10 MG/ML IV BOLUS
INTRAVENOUS | Status: DC | PRN
  Administered 2016-04-12: 200 mg via INTRAVENOUS

## 2016-04-12 MED ORDER — MEPERIDINE HCL 50 MG/ML IJ SOLN: 6.2500 mg | INTRAMUSCULAR | Status: DC | PRN

## 2016-04-12 MED ORDER — SUCCINYLCHOLINE CHLORIDE 20 MG/ML IJ SOLN
INTRAMUSCULAR | Status: AC
Start: 2016-04-12 — End: 2016-04-12
  Filled 2016-04-12: qty 1

## 2016-04-12 MED ORDER — ALUM & MAG HYDROXIDE-SIMETH 200-200-20 MG/5ML PO SUSP: 30.0000 mL | Freq: Four times a day (QID) | ORAL | Status: DC | PRN

## 2016-04-12 MED ORDER — ACETAMINOPHEN 500 MG PO TABS
1000.0000 mg | ORAL_TABLET | Freq: Three times a day (TID) | ORAL | Status: DC
  Administered 2016-04-13 – 2016-04-18 (×16): 1000 mg via ORAL
  Filled 2016-04-12 (×16): qty 2

## 2016-04-12 MED ORDER — LIP MEDEX EX OINT
1.0000 "application " | TOPICAL_OINTMENT | Freq: Two times a day (BID) | CUTANEOUS | Status: DC
  Administered 2016-04-13 – 2016-04-18 (×10): 1 via TOPICAL
  Filled 2016-04-12 (×5): qty 7

## 2016-04-12 MED ORDER — ZOLPIDEM TARTRATE 5 MG PO TABS
5.0000 mg | ORAL_TABLET | Freq: Every evening | ORAL | Status: DC | PRN
  Filled 2016-04-12: qty 1

## 2016-04-12 MED ORDER — DEXAMETHASONE SODIUM PHOSPHATE 10 MG/ML IJ SOLN
INTRAMUSCULAR | Status: AC
  Filled 2016-04-12: qty 1

## 2016-04-12 MED ORDER — DULOXETINE HCL 60 MG PO CPEP
60.0000 mg | ORAL_CAPSULE | Freq: Every day | ORAL | Status: DC
  Administered 2016-04-13 – 2016-04-18 (×6): 60 mg via ORAL
  Filled 2016-04-12 (×6): qty 1

## 2016-04-12 MED ORDER — ROCURONIUM BROMIDE 10 MG/ML (PF) SYRINGE
PREFILLED_SYRINGE | INTRAVENOUS | Status: DC | PRN
  Administered 2016-04-12: 10 mg via INTRAVENOUS
  Administered 2016-04-12: 40 mg via INTRAVENOUS
  Administered 2016-04-12 (×2): 10 mg via INTRAVENOUS

## 2016-04-12 MED ORDER — ALPRAZOLAM 0.5 MG PO TABS
0.5000 mg | ORAL_TABLET | Freq: Every day | ORAL | Status: DC | PRN
  Administered 2016-04-13 – 2016-04-17 (×6): 0.5 mg via ORAL
  Filled 2016-04-12 (×6): qty 1

## 2016-04-12 MED ORDER — PHENOL 1.4 % MT LIQD
2.0000 | OROMUCOSAL | Status: DC | PRN
  Filled 2016-04-12: qty 177

## 2016-04-12 MED ORDER — KCL IN DEXTROSE-NACL 20-5-0.45 MEQ/L-%-% IV SOLN
INTRAVENOUS | Status: DC
  Administered 2016-04-12 – 2016-04-13 (×3): via INTRAVENOUS
  Administered 2016-04-14 (×2): 75 mL/h via INTRAVENOUS
  Administered 2016-04-15 – 2016-04-17 (×2): via INTRAVENOUS
  Filled 2016-04-12 (×8): qty 1000

## 2016-04-12 MED ORDER — LACTATED RINGERS IV SOLN
INTRAVENOUS | Status: DC
  Administered 2016-04-12 (×2): via INTRAVENOUS

## 2016-04-12 MED ORDER — MIDAZOLAM HCL 5 MG/5ML IJ SOLN
INTRAMUSCULAR | Status: DC | PRN
  Administered 2016-04-12: 2 mg via INTRAVENOUS

## 2016-04-12 MED ORDER — DEXTROSE 5 % IV SOLN
2.0000 g | INTRAVENOUS | Status: AC
  Administered 2016-04-12: 2 g via INTRAVENOUS
  Filled 2016-04-12: qty 2

## 2016-04-12 MED ORDER — MAGIC MOUTHWASH
15.0000 mL | Freq: Four times a day (QID) | ORAL | Status: DC | PRN
  Filled 2016-04-12: qty 15

## 2016-04-12 MED ORDER — MENTHOL 3 MG MT LOZG: 1.0000 | LOZENGE | OROMUCOSAL | Status: DC | PRN

## 2016-04-12 MED ORDER — MOMETASONE FURO-FORMOTEROL FUM 100-5 MCG/ACT IN AERO
2.0000 | INHALATION_SPRAY | Freq: Two times a day (BID) | RESPIRATORY_TRACT | Status: DC
  Administered 2016-04-14 – 2016-04-18 (×8): 2 via RESPIRATORY_TRACT
  Filled 2016-04-12: qty 8.8

## 2016-04-12 MED ORDER — LIDOCAINE 2% (20 MG/ML) 5 ML SYRINGE
INTRAMUSCULAR | Status: DC | PRN
  Administered 2016-04-12: 100 mg via INTRAVENOUS

## 2016-04-12 MED ORDER — ROCURONIUM BROMIDE 10 MG/ML (PF) SYRINGE
PREFILLED_SYRINGE | INTRAVENOUS | Status: AC
  Filled 2016-04-12: qty 10

## 2016-04-12 MED ORDER — FENTANYL CITRATE (PF) 250 MCG/5ML IJ SOLN
INTRAMUSCULAR | Status: AC
  Filled 2016-04-12: qty 5

## 2016-04-12 MED ORDER — SUGAMMADEX SODIUM 200 MG/2ML IV SOLN
INTRAVENOUS | Status: DC | PRN
  Administered 2016-04-12: 150 mg via INTRAVENOUS

## 2016-04-12 MED ORDER — TRIAMCINOLONE ACETONIDE 0.025 % EX OINT
1.0000 "application " | TOPICAL_OINTMENT | Freq: Two times a day (BID) | CUTANEOUS | Status: DC | PRN
  Filled 2016-04-12: qty 15

## 2016-04-12 MED ORDER — PROPOFOL 10 MG/ML IV BOLUS
INTRAVENOUS | Status: AC
  Filled 2016-04-12: qty 20

## 2016-04-12 MED ORDER — CHLORHEXIDINE GLUCONATE CLOTH 2 % EX PADS: 6.0000 | MEDICATED_PAD | Freq: Once | CUTANEOUS | Status: DC

## 2016-04-12 MED ORDER — LACTATED RINGERS IV BOLUS (SEPSIS): 1000.0000 mL | Freq: Three times a day (TID) | INTRAVENOUS | Status: AC | PRN

## 2016-04-12 MED ORDER — LACTATED RINGERS IV SOLN: INTRAVENOUS | Status: DC

## 2016-04-12 MED ORDER — ALVIMOPAN 12 MG PO CAPS
12.0000 mg | ORAL_CAPSULE | Freq: Once | ORAL | Status: AC
  Administered 2016-04-12: 12 mg via ORAL
  Filled 2016-04-12: qty 1

## 2016-04-12 MED ORDER — PROCHLORPERAZINE EDISYLATE 5 MG/ML IJ SOLN: 5.0000 mg | INTRAMUSCULAR | Status: DC | PRN

## 2016-04-12 MED ORDER — GABAPENTIN 300 MG PO CAPS
300.0000 mg | ORAL_CAPSULE | ORAL | Status: AC
  Administered 2016-04-12: 300 mg via ORAL
  Filled 2016-04-12: qty 1

## 2016-04-12 MED ORDER — ACETAMINOPHEN 500 MG PO TABS
1000.0000 mg | ORAL_TABLET | ORAL | Status: AC
  Administered 2016-04-12: 1000 mg via ORAL
  Filled 2016-04-12: qty 2

## 2016-04-12 MED ORDER — 0.9 % SODIUM CHLORIDE (POUR BTL) OPTIME
TOPICAL | Status: DC | PRN
  Administered 2016-04-12: 3000 mL

## 2016-04-12 MED ORDER — SUGAMMADEX SODIUM 200 MG/2ML IV SOLN
INTRAVENOUS | Status: AC
  Filled 2016-04-12: qty 2

## 2016-04-12 SURGICAL SUPPLY — 51 items
BLADE EXTENDED COATED 6.5IN (ELECTRODE) IMPLANT
BLADE HEX COATED 2.75 (ELECTRODE) ×1 IMPLANT
CHLORAPREP W/TINT 26ML (MISCELLANEOUS) ×1 IMPLANT
COVER MAYO STAND STRL (DRAPES) ×4 IMPLANT
COVER SURGICAL LIGHT HANDLE (MISCELLANEOUS) ×3 IMPLANT
DRAPE LAPAROSCOPIC ABDOMINAL (DRAPES) ×1 IMPLANT
DRAPE SHEET LG 3/4 BI-LAMINATE (DRAPES) ×1 IMPLANT
DRAPE WARM FLUID 44X44 (DRAPE) ×2 IMPLANT
DRSG OPSITE POSTOP 4X10 (GAUZE/BANDAGES/DRESSINGS) IMPLANT
DRSG OPSITE POSTOP 4X6 (GAUZE/BANDAGES/DRESSINGS) IMPLANT
DRSG OPSITE POSTOP 4X8 (GAUZE/BANDAGES/DRESSINGS) IMPLANT
DRSG PAD ABDOMINAL 8X10 ST (GAUZE/BANDAGES/DRESSINGS) ×1 IMPLANT
ELECT PENCIL ROCKER SW 15FT (MISCELLANEOUS) ×2 IMPLANT
ELECT REM PT RETURN 9FT ADLT (ELECTROSURGICAL) ×2
ELECTRODE REM PT RTRN 9FT ADLT (ELECTROSURGICAL) ×1 IMPLANT
GAUZE SPONGE 4X4 12PLY STRL (GAUZE/BANDAGES/DRESSINGS) ×2 IMPLANT
GLOVE BIOGEL M 8.0 STRL (GLOVE) ×4 IMPLANT
GOWN STRL REUS W/TWL XL LVL3 (GOWN DISPOSABLE) ×11 IMPLANT
HANDLE STAPLE EGIA 4 XL (STAPLE) ×1 IMPLANT
HANDLE SUCTION POOLE (INSTRUMENTS) ×1 IMPLANT
KIT BASIN OR (CUSTOM PROCEDURE TRAY) ×2 IMPLANT
KIT PREVENA INCISION MGT20CM45 (CANNISTER) ×1 IMPLANT
MARKER SKIN DUAL TIP RULER LAB (MISCELLANEOUS) ×2 IMPLANT
PACK GENERAL/GYN (CUSTOM PROCEDURE TRAY) ×1 IMPLANT
RELOAD EGIA 45 MED/THCK PURPLE (STAPLE) ×1 IMPLANT
RELOAD EGIA 60 MED/THCK PURPLE (STAPLE) ×2 IMPLANT
RELOAD STAPLE 60 MED/THCK ART (STAPLE) IMPLANT
SCRUB TECHNI CARE 4 OZ NO DYE (MISCELLANEOUS) ×2 IMPLANT
STAPLER VISISTAT 35W (STAPLE) ×2 IMPLANT
SUCTION POOLE HANDLE (INSTRUMENTS) ×2
SUT NOVA 1 T20/GS 25DT (SUTURE) IMPLANT
SUT NOVA NAB DX-16 0-1 5-0 T12 (SUTURE) ×2 IMPLANT
SUT NOVA NAB GS-21 0 18 T12 DT (SUTURE) IMPLANT
SUT PDS AB 1 CTX 36 (SUTURE) IMPLANT
SUT PDS AB 1 TP1 54 (SUTURE) ×2 IMPLANT
SUT PDS AB 4-0 SH 27 (SUTURE) ×2 IMPLANT
SUT PROLENE 0 CT 1 CR/8 (SUTURE) IMPLANT
SUT SILK 2 0 (SUTURE) ×2
SUT SILK 2 0 SH CR/8 (SUTURE) ×2 IMPLANT
SUT SILK 2-0 18XBRD TIE 12 (SUTURE) ×1 IMPLANT
SUT SILK 3 0 (SUTURE) ×2
SUT SILK 3 0 SH CR/8 (SUTURE) ×3 IMPLANT
SUT SILK 3-0 18XBRD TIE 12 (SUTURE) ×1 IMPLANT
SUT VIC AB 2-0 CT2 27 (SUTURE) IMPLANT
TAPE CLOTH SURG 4X10 WHT LF (GAUZE/BANDAGES/DRESSINGS) ×1 IMPLANT
TOWEL OR 17X26 10 PK STRL BLUE (TOWEL DISPOSABLE) ×4 IMPLANT
TOWEL OR NON WOVEN STRL DISP B (DISPOSABLE) ×2 IMPLANT
TRAY FOLEY CATH 14FRSI W/METER (CATHETERS) ×1 IMPLANT
TRAY FOLEY W/METER SILVER 16FR (SET/KITS/TRAYS/PACK) ×1 IMPLANT
TRAY LAP CHOLE (CUSTOM PROCEDURE TRAY) ×1 IMPLANT
YANKAUER SUCT BULB TIP NO VENT (SUCTIONS) ×1 IMPLANT

## 2016-04-12 NOTE — Anesthesia Preprocedure Evaluation (Addendum)
Anesthesia Evaluation  Patient identified by MRN, date of birth, ID band Patient awake    Reviewed: Allergy & Precautions, NPO status , Patient's Chart, lab work & pertinent test results  Airway Mallampati: II  TM Distance: >3 FB Neck ROM: Full    Dental  (+) Upper Dentures, Partial Lower, Dental Advisory Given   Pulmonary COPD, former smoker,    breath sounds clear to auscultation       Cardiovascular hypertension, Pt. on medications  Rhythm:Regular Rate:Normal     Neuro/Psych  Headaches, PSYCHIATRIC DISORDERS Anxiety Depression    GI/Hepatic Neg liver ROS, GERD  ,  Endo/Other  Hypothyroidism   Renal/GU negative Renal ROS  negative genitourinary   Musculoskeletal negative musculoskeletal ROS (+)   Abdominal   Peds negative pediatric ROS (+)  Hematology negative hematology ROS (+)   Anesthesia Other Findings   Reproductive/Obstetrics negative OB ROS                            Lab Results  Component Value Date   WBC 5.9 04/05/2016   HGB 13.6 04/05/2016   HCT 41.5 04/05/2016   MCV 84.2 04/05/2016   PLT 292 04/05/2016   Lab Results  Component Value Date   CREATININE 0.86 04/05/2016   BUN 8 04/05/2016   NA 138 04/05/2016   K 3.5 04/05/2016   CL 95 (L) 04/05/2016   CO2 33 (H) 04/05/2016   Lab Results  Component Value Date   INR 1.22 10/05/2011   11/2015 .EKG: normal sinus rhythm.  Anesthesia Physical Anesthesia Plan  ASA: II  Anesthesia Plan: General   Post-op Pain Management:    Induction: Intravenous  Airway Management Planned: Oral ETT  Additional Equipment:   Intra-op Plan:   Post-operative Plan: Extubation in OR  Informed Consent: I have reviewed the patients History and Physical, chart, labs and discussed the procedure including the risks, benefits and alternatives for the proposed anesthesia with the patient or authorized representative who has indicated  his/her understanding and acceptance.   Dental advisory given  Plan Discussed with: CRNA  Anesthesia Plan Comments:         Anesthesia Quick Evaluation

## 2016-04-12 NOTE — Anesthesia Postprocedure Evaluation (Signed)
Anesthesia Post Note  Patient: Stacy Dalton  Procedure(s) Performed: Procedure(s) (LRB): HARTMAN TAKEDOWN (N/A)  Patient location during evaluation: PACU Anesthesia Type: General Level of consciousness: awake and alert Pain management: pain level controlled Vital Signs Assessment: post-procedure vital signs reviewed and stable Respiratory status: spontaneous breathing, nonlabored ventilation, respiratory function stable and patient connected to nasal cannula oxygen Cardiovascular status: blood pressure returned to baseline and stable Postop Assessment: no signs of nausea or vomiting Anesthetic complications: no    Last Vitals:  Vitals:   04/12/16 1345 04/12/16 1400  BP: (!) 132/59 132/66  Pulse: 73 70  Resp: 13 14  Temp:  36.7 C    Last Pain:  Vitals:   04/12/16 1400  TempSrc:   PainSc: [place]

## 2016-04-12 NOTE — Anesthesia Procedure Notes (Signed)
Procedure Name: Intubation Date/Time: 04/12/2016 10:19 AM Performed by: Lind Covert Pre-anesthesia Checklist: Patient identified, Emergency Drugs available, Suction available, Patient being monitored and Timeout performed Patient Re-evaluated:Patient Re-evaluated prior to inductionOxygen Delivery Method: Circle system utilized Preoxygenation: Pre-oxygenation with 100% oxygen Intubation Type: IV induction Laryngoscope Size: Mac and 3 Grade View: Grade I Tube type: Oral Tube size: 7.0 mm Number of attempts: 1 Airway Equipment and Method: Stylet Placement Confirmation: ETT inserted through vocal cords under direct vision,  positive ETCO2 and breath sounds checked- equal and bilateral Secured at: 21 cm Tube secured with: Tape Dental Injury: Teeth and Oropharynx as per pre-operative assessment

## 2016-04-12 NOTE — Brief Op Note (Signed)
04/12/2016  1:36 PM  PATIENT:  Stacy Dalton  65 y.o. female  PRE-OPERATIVE DIAGNOSIS:  PRIOR STERCORAL PERFORATION AND COLOSTOMY  POST-OPERATIVE DIAGNOSIS:  colostomy   PROCEDURE:  Procedure(s): HARTMAN TAKEDOWN (N/A)  SURGEON:  Surgeon(s) and Role:    * Johnathan Hausen, MD - Primary    * Autumn Messing III, MD - Assisting  PHYSICIAN ASSISTANT:   ASSISTANTS: Star Age, MD   ANESTHESIA:   general  EBL:  Total I/O In: 1000 [I.V.:1000] Out: 300 [Urine:100; Blood:200]  BLOOD ADMINISTERED:none  DRAINS: none   LOCAL MEDICATIONS USED:  NONE  SPECIMEN:  No Specimen  DISPOSITION OF SPECIMEN:  N/A  COUNTS:  YES  TOURNIQUET:  * No tourniquets in log *  DICTATION: .Other Dictation: Dictation Number [id number]  PLAN OF CARE: Admit to inpatient   PATIENT DISPOSITION:  PACU - hemodynamically stable.   Delay start of Pharmacological VTE agent (>24hrs) due to surgical blood loss or risk of bleeding: no

## 2016-04-12 NOTE — Interval H&P Note (Signed)
History and Physical Interval Note:  04/12/2016 10:12 AM  Stacy Dalton  has presented today for surgery, with the diagnosis of PRIOR STERCORAL PERFORATION AND COLOSTOMY  The various methods of treatment have been discussed with the patient and family. After consideration of risks, benefits and other options for treatment, the patient has consented to  Procedure(s): HARTMAN TAKEDOWN (N/A) as a surgical intervention .  The patient's history has been reviewed, patient examined, no change in status, stable for surgery.  I have reviewed the patient's chart and labs.  Questions were answered to the patient's satisfaction.     [name] B

## 2016-04-12 NOTE — Transfer of Care (Signed)
Immediate Anesthesia Transfer of Care Note  Patient: Stacy Dalton  Procedure(s) Performed: Procedure(s): HARTMAN TAKEDOWN (N/A)  Patient Location: PACU  Anesthesia Type:General  Level of Consciousness: sedated  Airway & Oxygen Therapy: Patient Spontanous Breathing and Patient connected to face mask oxygen  Post-op Assessment: Report given to RN and Post -op Vital signs reviewed and stable  Post vital signs: Reviewed and stable  Last Vitals:  Vitals:   04/12/16 0844  BP: 127/77  Pulse: 80  Resp: 18  Temp: 36.7 C    Last Pain:  Vitals:   04/12/16 0850  TempSrc:   PainSc: 2       Patients Stated Pain Goal: 3 ([id number])  Complications: No apparent anesthesia complications

## 2016-04-13 LAB — BASIC METABOLIC PANEL
Anion gap: 8 (ref 5–15)
BUN: 9 mg/dL (ref 6–20)
CO2: 30 mmol/L (ref 22–32)
Calcium: 8.7 mg/dL — ABNORMAL LOW (ref 8.9–10.3)
Chloride: 99 mmol/L — ABNORMAL LOW (ref 101–111)
Creatinine, Ser: 0.84 mg/dL (ref 0.44–1.00)
GFR calc Af Amer: >60 mL/min (ref >60)
GFR calc non Af Amer: >60 mL/min (ref >60)
Glucose, Bld: 138 mg/dL — ABNORMAL HIGH (ref 65–99)
Potassium: 4 mmol/L (ref 3.5–5.1)
Sodium: 137 mmol/L (ref 135–145)

## 2016-04-13 LAB — CBC
HCT: 29.2 % — ABNORMAL LOW (ref 36.0–46.0)
Hemoglobin: 9.9 g/dL — ABNORMAL LOW (ref 12.0–15.0)
MCH: 28.1 pg (ref 26.0–34.0)
MCHC: 33.9 g/dL (ref 30.0–36.0)
MCV: 83 fL (ref 78.0–100.0)
Platelets: 276 10*3/uL (ref 150–400)
RBC: 3.52 MIL/uL — ABNORMAL LOW (ref 3.87–5.11)
RDW: 14.6 % (ref 11.5–15.5)
WBC: 18 10*3/uL — ABNORMAL HIGH (ref 4.0–10.5)

## 2016-04-13 MED ORDER — ONDANSETRON HCL 4 MG/2ML IJ SOLN: 4.0000 mg | Freq: Four times a day (QID) | INTRAMUSCULAR | Status: DC | PRN

## 2016-04-13 MED ORDER — DIPHENHYDRAMINE HCL 50 MG/ML IJ SOLN: 12.5000 mg | Freq: Four times a day (QID) | INTRAMUSCULAR | Status: DC | PRN

## 2016-04-13 MED ORDER — SODIUM CHLORIDE 0.9% FLUSH: 9.0000 mL | INTRAVENOUS | Status: DC | PRN

## 2016-04-13 MED ORDER — DIPHENHYDRAMINE HCL 12.5 MG/5ML PO ELIX: 12.5000 mg | ORAL_SOLUTION | Freq: Four times a day (QID) | ORAL | Status: DC | PRN

## 2016-04-13 MED ORDER — HYDROMORPHONE 1 MG/ML IV SOLN
INTRAVENOUS | Status: DC
  Administered 2016-04-13: 10:00:00 via INTRAVENOUS
  Administered 2016-04-13: 3 mg via INTRAVENOUS
  Administered 2016-04-13: 2.1 mg via INTRAVENOUS
  Administered 2016-04-13: 4.1 mg via INTRAVENOUS
  Administered 2016-04-14: 3.6 mg via INTRAVENOUS
  Administered 2016-04-14: 5.7 mg via INTRAVENOUS
  Administered 2016-04-14: 1.7 mg via INTRAVENOUS
  Administered 2016-04-14: 1.8 mg via INTRAVENOUS
  Administered 2016-04-14: 3.3 mg via INTRAVENOUS
  Administered 2016-04-14: 1.8 mg via INTRAVENOUS
  Administered 2016-04-15: 3 mg via INTRAVENOUS
  Administered 2016-04-15: 0.6 mg via INTRAVENOUS
  Administered 2016-04-15: 4.2 mg via INTRAVENOUS
  Administered 2016-04-16: 2.4 mg via INTRAVENOUS
  Administered 2016-04-16: 1.8 mg via INTRAVENOUS
  Filled 2016-04-13 (×3): qty 25

## 2016-04-13 MED ORDER — NALOXONE HCL 0.4 MG/ML IJ SOLN: 0.4000 mg | INTRAMUSCULAR | Status: DC | PRN

## 2016-04-13 NOTE — Progress Notes (Signed)
Patient ID: Stacy Dalton, female   DOB: 03-Jun-1951, 65 y.o.   MRN: [id number] [facility] Surgery Progress Note:   1 Day Post-Op  Subjective: Mental status is clear.  Having break through pain Objective: Vital signs in last 24 hours: Temp:  [98 F (36.7 C)-100.9 F (38.3 C)] 98.6 F (37 C) (10/12 0516) Pulse Rate:  [69-86] 69 (10/12 0516) Resp:  [12-18] 14 (10/12 0516) BP: (108-146)/(59-78) 108/64 (10/12 0516) SpO2:  [98 %-100 %] 99 % (10/12 0516) Weight:  [56.2 kg (124 lb)] 56.2 kg (124 lb) (10/11 0850)  Intake/Output from previous day: 10/11 0701 - 10/12 0700 In: 2402.1 [I.V.:2402.1] Out: 2050 [Urine:1850; Blood:200] Intake/Output this shift: No intake/output data recorded.  Physical Exam: Work of breathing is not labored.    Lab Results:  Results for orders placed or performed during the hospital encounter of 04/12/16 (from the past 48 hour(s))  CBC     Status: Abnormal   Collection Time: 04/12/16  3:17 PM  Result Value Ref Range   WBC 21.1 (H) 4.0 - 10.5 K/uL   RBC 4.64 3.87 - 5.11 MIL/uL   Hemoglobin 12.9 12.0 - 15.0 g/dL   HCT 38.3 36.0 - 46.0 %   MCV 82.5 78.0 - 100.0 fL   MCH 27.8 26.0 - 34.0 pg   MCHC 33.7 30.0 - 36.0 g/dL   RDW 14.1 11.5 - 15.5 %   Platelets 280 150 - 400 K/uL  Creatinine, serum     Status: None   Collection Time: 04/12/16  3:17 PM  Result Value Ref Range   Creatinine, Ser 0.93 0.44 - 1.00 mg/dL   GFR calc non Af Amer >60 >60 mL/min   GFR calc Af Amer >60 >60 mL/min    Comment: (NOTE) The eGFR has been calculated using the CKD EPI equation. This calculation has not been validated in all clinical situations. eGFR's persistently <60 mL/min signify possible Chronic Kidney Disease.   Basic metabolic panel     Status: Abnormal   Collection Time: 04/13/16  4:57 AM  Result Value Ref Range   Sodium 137 135 - 145 mmol/L   Potassium 4.0 3.5 - 5.1 mmol/L   Chloride 99 (L) 101 - 111 mmol/L   CO2 30 22 - 32 mmol/L   Glucose, Bld 138 (H)  65 - 99 mg/dL   BUN 9 6 - 20 mg/dL   Creatinine, Ser 0.84 0.44 - 1.00 mg/dL   Calcium 8.7 (L) 8.9 - 10.3 mg/dL   GFR calc non Af Amer >60 >60 mL/min   GFR calc Af Amer >60 >60 mL/min    Comment: (NOTE) The eGFR has been calculated using the CKD EPI equation. This calculation has not been validated in all clinical situations. eGFR's persistently <60 mL/min signify possible Chronic Kidney Disease.    Anion gap 8 5 - 15  CBC     Status: Abnormal   Collection Time: 04/13/16  4:57 AM  Result Value Ref Range   WBC 18.0 (H) 4.0 - 10.5 K/uL   RBC 3.52 (L) 3.87 - 5.11 MIL/uL   Hemoglobin 9.9 (L) 12.0 - 15.0 g/dL    Comment: DELTA CHECK NOTED REPEATED TO VERIFY    HCT 29.2 (L) 36.0 - 46.0 %   MCV 83.0 78.0 - 100.0 fL   MCH 28.1 26.0 - 34.0 pg   MCHC 33.9 30.0 - 36.0 g/dL   RDW 14.6 11.5 - 15.5 %   Platelets 276 150 - 400 K/uL    Radiology/Results:  No results found.  Anti-infectives: Anti-infectives    Start     Dose/Rate Route Frequency Ordered Stop   04/12/16 2200  cefoTEtan (CEFOTAN) 2 g in dextrose 5 % 50 mL IVPB     2 g 100 mL/hr over 30 Minutes Intravenous Every 12 hours 04/12/16 1430 04/12/16 2306   04/12/16 0838  cefoTEtan (CEFOTAN) 2 g in dextrose 5 % 50 mL IVPB     2 g 100 mL/hr over 30 Minutes Intravenous On call to O.R. 04/12/16 0839 04/12/16 1025      Assessment/Plan: Problem List: Patient Active Problem List   Diagnosis Date Noted  . Status post colostomy takedown 04/12/2016  . Chronic constipation 12/03/2015  . Peritonitis with abscess of intestine ([place]) 11/29/2015  . COPD (chronic obstructive pulmonary disease) ([place]) 09/20/2015  . Osteopenia 06/10/2014  . Vitamin D deficiency disease 06/10/2014  . Hypothyroidism   . Depression 11/18/2012  . Insomnia 11/18/2012  . Chronic pain syndrome 11/18/2012  . HLD (hyperlipidemia) 09/01/2012  . GAD (generalized anxiety disorder) 09/01/2012    Class: Chronic  . S/P colostomy ([place]) 04/19/2012  . Normocytic anemia  10/15/2011  . Hypertension 10/13/2011  . Chronic back pain 10/13/2011    Will place on PCA Dilaudid for better pain control.   1 Day Post-Op    LOS: 1 day   Matt B. Hassell Done, MD, [facility] [phone] beeper [phone]  04/13/2016 8:33 AM

## 2016-04-13 NOTE — Progress Notes (Signed)
Spoke with patient at bedside, she had questions about what her options were for care after d/c. Discussed home vs SNF and what quantifies her for either. She lives alone but has lots of support from family and friends. She has been to [facility] after previous hospitalization so is familiar with that. She also has received HH services from [facility]. She wants to see how she progresses before she makes a decision. I told her that was reasonable, will have PT evaluate for mobility.

## 2016-04-13 NOTE — Op Note (Signed)
NAMEMarland Kitchen  Stacy, Dalton NO.:  [id number]  MEDICAL RECORD NO.:  [id number]  LOCATION:  [id number]                         FACILITY[facility]  [facility]  PHYSICIAN:  Isabel Dalton. Hassell Done, MD  DATE OF BIRTH:  05-Jan-1951  DATE OF PROCEDURE:  04/12/2016 DATE OF DISCHARGE:                              OPERATIVE REPORT   PREOPERATIVE DIAGNOSIS:  Stacy Dalton procedure previously done for stercoral perforation of the colon with feculent peritonitis.  PROCEDURE:  Raeford Razor procedure with ascending colon to sigmoid colon anastomosis.  SURGEON:  Isabel Dalton. Hassell Done, MD.  ASSISTANTLorine Bears.  ANESTHESIA:  General endotracheal.  ESTIMATED BLOOD LOSS:  60 mL.  DESCRIPTION OF PROCEDURE:  The patient was taken to room [facility] at [facility] and given general anesthesia.  The ostomy in her right side was closed with a pursestring suture of 2-0 silk.  The abdomen was prepped with Techni-Care and also with ChloraPrep and draped sterilely. The previous midline incision was excised, and the abdomen was entered without difficulty through a very dense scar.  There were surprisingly few adhesions.  She did have a lot of vascularity to her wound, and it was bleeding a lot.  That is probably because it is only 14 months old and healing.  Upon entering, I was able to lyse the adhesions and identified the Prolene sutures on the remnant colon.  I freed this structure up.  I then identified where the ascending colon colostomy was and took it down full thickness.  I went ahead in the abdomen.  I then divided it with a Tri-Staple, 2 applications, purple loads.  I then placed it in the sigmoid colon in a side-by-side fashion and created a functional end-to-end anastomosis along the antimesenteric borders with a 6 cm Endostapler.  I went in away from each end about a centimeter to create myotomy and to pass the forks of the stapler, and then the common defect was closed in 2 layers with canal sutures of  4-0 PDS and then inverted 3-0 silk pop-offs.  There was a broad defect between the two, which I thought I could not close and allowed the small bowel to be ad lib.  Then, there were no evidence of any enterotomies.  I irrigated with saline.  Sponge and needle counts were reported as correct.  The ostomy site was closed with interrupted #1 Novafil.  We did then change our gowns and gloves and used a separate set to close.  The fascia was closed with running double-stranded #1 PDS tied in the middle.  The wounds were then closed with staples and with the Provena suction device on the midline incision.  The ostomy site had a piece a wick of Telfa in between staples.  The patient seemed to tolerate this procedure well and was taken to recovery room in satisfactory condition.     Isabel Dalton Hassell Done, MD     MBM/MEDQ  D:  04/12/2016  T:  04/13/2016  Job:  [id number]

## 2016-04-14 LAB — CBC WITH DIFFERENTIAL/PLATELET
Basophils Absolute: 0 10*3/uL (ref 0.0–0.1)
Basophils Relative: 0 %
Eosinophils Absolute: 0.1 10*3/uL (ref 0.0–0.7)
Eosinophils Relative: 1 %
HCT: 25.5 % — ABNORMAL LOW (ref 36.0–46.0)
Hemoglobin: 8.2 g/dL — ABNORMAL LOW (ref 12.0–15.0)
Lymphocytes Relative: 22 %
Lymphs Abs: 2.2 10*3/uL (ref 0.7–4.0)
MCH: 27.6 pg (ref 26.0–34.0)
MCHC: 32.2 g/dL (ref 30.0–36.0)
MCV: 85.9 fL (ref 78.0–100.0)
Monocytes Absolute: 0.9 10*3/uL (ref 0.1–1.0)
Monocytes Relative: 9 %
Neutro Abs: 6.9 10*3/uL (ref 1.7–7.7)
Neutrophils Relative %: 68 %
Platelets: 187 10*3/uL (ref 150–400)
RBC: 2.97 MIL/uL — ABNORMAL LOW (ref 3.87–5.11)
RDW: 15.1 % (ref 11.5–15.5)
WBC: 10.1 10*3/uL (ref 4.0–10.5)

## 2016-04-14 NOTE — Evaluation (Signed)
Physical Therapy Evaluation Patient Details Name: Stacy Dalton MRN: [id number] DOB: May 19, 1951 Today's Date: 04/14/2016   History of Present Illness  Pt is a 65 y/o female with a PMH of HTN, osteopenia, depression, anxiety, GERD and COPD admitted for colostomy takedown.  Clinical Impression  Pt is s/p above surgery resulting in functional limitations due to the deficits listed below (see PT Problem List). Pt will benefit from skilled PT to increase their independence and safety with mobility to allow discharge. Pt reported significant abdominal pain today, however she was able to perform all mobility with min guard. Pt was agreeable with plans to d/c to SNF unless significant progress is made and she feels safe to d/c home with family assistance. Pt expressed eagerness to continue working with acute PT to resolve strength and endurance deficits.      Follow Up Recommendations SNF (HHPT if significant progress achieved prior to d/c)    Equipment Recommendations  Rolling walker with 5" wheels    Recommendations for Other Services       Precautions / Restrictions Precautions Precautions: Fall Restrictions Weight Bearing Restrictions: No      Mobility  Bed Mobility Overal bed mobility: Needs Assistance Bed Mobility: Supine to Sit;Sit to Supine     Supine to sit: Min guard Sit to supine: Min guard   General bed mobility comments: guarding for safety  Transfers Overall transfer level: Needs assistance Equipment used: Rolling walker (2 wheeled) Transfers: Sit to/from Stand Sit to Stand: Min guard         General transfer comment: guarding for safety; verbal cues for UE placement on RW and for controlled rise and descent  Ambulation/Gait Ambulation/Gait assistance: Min guard Ambulation Distance (Feet): 50 Feet Assistive device: Rolling walker (2 wheeled) Gait Pattern/deviations: Step-through pattern;Decreased stride length Gait velocity: decreased   General Gait  Details: guarding for safety; pt required verbal cues to maintain breathing pattern during gait; SpO2 on room air decreased to low 70s due to pt holding breath/grimacing from pain, upon verbal cues to resume deep breathing, SpO2 raised to high 90s and remained there until pt held her breath again  Stairs            Wheelchair Mobility    Modified Rankin (Stroke Patients Only)       Balance Overall balance assessment: Needs assistance;No apparent balance deficits (not formally assessed)         Standing balance support: Bilateral upper extremity supported;During functional activity Standing balance-Leahy Scale: Poor Standing balance comment: pt required RW for UE support during gait and transfers                             Pertinent Vitals/Pain Pain Assessment: 0-10 Pain Score: 7  Pain Location: R lower abdomen Pain Descriptors / Indicators: Cramping;Grimacing;Discomfort Pain Intervention(s): Limited activity within patient's tolerance;Monitored during session;Premedicated before session;Repositioned;Heat applied    Home Living Family/patient expects to be discharged to:: Private residence Living Arrangements: Alone Available Help at Discharge: Family;Available PRN/intermittently Type of Home: House Home Access: Stairs to enter Entrance Stairs-Rails: None Entrance Stairs-Number of Steps: 1+1 Home Layout: One level Home Equipment: None Additional Comments: pt planning on SNF at this time; if significant progress achieved, pt would prefer to be d/c home    Prior Function Level of Independence: Independent               Hand Dominance        Extremity/Trunk Assessment  Lower Extremity Assessment: Generalized weakness (not formally assessed; anticipated weakness s/p surgery)         Communication   Communication: No difficulties  Cognition Arousal/Alertness: Awake/alert Behavior During Therapy: WFL for tasks  assessed/performed Overall Cognitive Status: Within Functional Limits for tasks assessed                      General Comments      Exercises     Assessment/Plan    PT Assessment Patient needs continued PT services  PT Problem List Decreased mobility;Decreased knowledge of use of DME;Pain;Decreased strength          PT Treatment Interventions DME instruction;Gait training;Stair training;Functional mobility training;Therapeutic activities;Therapeutic exercise;Patient/family education    PT Goals (Current goals can be found in the Care Plan section)  Acute Rehab PT Goals Patient Stated Goal: regain strength and decrease pain PT Goal Formulation: With patient Time For Goal Achievement: 04/21/16 Potential to Achieve Goals: Good    Frequency Min 3X/week   Barriers to discharge        Co-evaluation               End of Session Equipment Utilized During Treatment: Gait belt Activity Tolerance: Patient tolerated treatment well (slightly limited by pain) Patient left: in bed;with call bell/phone within reach;with bed alarm set           Time: 0921-0947 PT Time Calculation (min) (ACUTE ONLY): 26 min   Charges:   PT Evaluation $PT Eval Moderate Complexity: 1 Procedure     PT G CodesDewitt Hoes Apr 28, 2016, 11:57 AM Dewitt Hoes, SPT

## 2016-04-14 NOTE — Progress Notes (Signed)
Patient ID: Stacy Dalton, female   DOB: 1950/12/30, 65 y.o.   MRN: [id number] [facility] Progress Note:   2 Days Post-Op  Subjective: Mental status is clear Objective: Vital signs in last 24 hours: Temp:  [98.2 F (36.8 C)-99.2 F (37.3 C)] 98.2 F (36.8 C) (10/13 0544) Pulse Rate:  [63-75] 63 (10/13 0544) Resp:  [9-20] 11 (10/13 0803) BP: (99-124)/(47-66) 124/64 (10/13 0544) SpO2:  [94 %-100 %] 97 % (10/13 0803)  Intake/Output from previous day: 10/12 0701 - 10/13 0700 In: 0  Out: 1052 [Urine:1050; Stool:2] Intake/Output this shift: No intake/output data recorded.  Physical Exam: Work of breathing is not labored.  Incisions to [place].  Ostomy site has wick that can come out over the weekend  Lab Results:  Results for orders placed or performed during the hospital encounter of 04/12/16 (from the past 48 hour(s))  CBC     Status: Abnormal   Collection Time: 04/12/16  3:17 PM  Result Value Ref Range   WBC 21.1 (H) 4.0 - 10.5 K/uL   RBC 4.64 3.87 - 5.11 MIL/uL   Hemoglobin 12.9 12.0 - 15.0 g/dL   HCT 38.3 36.0 - 46.0 %   MCV 82.5 78.0 - 100.0 fL   MCH 27.8 26.0 - 34.0 pg   MCHC 33.7 30.0 - 36.0 g/dL   RDW 14.1 11.5 - 15.5 %   Platelets 280 150 - 400 K/uL  Creatinine, serum     Status: None   Collection Time: 04/12/16  3:17 PM  Result Value Ref Range   Creatinine, Ser 0.93 0.44 - 1.00 mg/dL   GFR calc non Af Amer >60 >60 mL/min   GFR calc Af Amer >60 >60 mL/min    Comment: (NOTE) The eGFR has been calculated using the CKD EPI equation. This calculation has not been validated in all clinical situations. eGFR's persistently <60 mL/min signify possible Chronic Kidney Disease.   Basic metabolic panel     Status: Abnormal   Collection Time: 04/13/16  4:57 AM  Result Value Ref Range   Sodium 137 135 - 145 mmol/L   Potassium 4.0 3.5 - 5.1 mmol/L   Chloride 99 (L) 101 - 111 mmol/L   CO2 30 22 - 32 mmol/L   Glucose, Bld 138 (H) 65 - 99 mg/dL   BUN 9 6 -  20 mg/dL   Creatinine, Ser 0.84 0.44 - 1.00 mg/dL   Calcium 8.7 (L) 8.9 - 10.3 mg/dL   GFR calc non Af Amer >60 >60 mL/min   GFR calc Af Amer >60 >60 mL/min    Comment: (NOTE) The eGFR has been calculated using the CKD EPI equation. This calculation has not been validated in all clinical situations. eGFR's persistently <60 mL/min signify possible Chronic Kidney Disease.    Anion gap 8 5 - 15  CBC     Status: Abnormal   Collection Time: 04/13/16  4:57 AM  Result Value Ref Range   WBC 18.0 (H) 4.0 - 10.5 K/uL   RBC 3.52 (L) 3.87 - 5.11 MIL/uL   Hemoglobin 9.9 (L) 12.0 - 15.0 g/dL    Comment: DELTA CHECK NOTED REPEATED TO VERIFY    HCT 29.2 (L) 36.0 - 46.0 %   MCV 83.0 78.0 - 100.0 fL   MCH 28.1 26.0 - 34.0 pg   MCHC 33.9 30.0 - 36.0 g/dL   RDW 14.6 11.5 - 15.5 %   Platelets 276 150 - 400 K/uL  CBC with Differential/Platelet  Status: Abnormal   Collection Time: 04/14/16  4:53 AM  Result Value Ref Range   WBC 10.1 4.0 - 10.5 K/uL   RBC 2.97 (L) 3.87 - 5.11 MIL/uL   Hemoglobin 8.2 (L) 12.0 - 15.0 g/dL   HCT 25.5 (L) 36.0 - 46.0 %   MCV 85.9 78.0 - 100.0 fL   MCH 27.6 26.0 - 34.0 pg   MCHC 32.2 30.0 - 36.0 g/dL   RDW 15.1 11.5 - 15.5 %   Platelets 187 150 - 400 K/uL   Neutrophils Relative % 68 %   Neutro Abs 6.9 1.7 - 7.7 K/uL   Lymphocytes Relative 22 %   Lymphs Abs 2.2 0.7 - 4.0 K/uL   Monocytes Relative 9 %   Monocytes Absolute 0.9 0.1 - 1.0 K/uL   Eosinophils Relative 1 %   Eosinophils Absolute 0.1 0.0 - 0.7 K/uL   Basophils Relative 0 %   Basophils Absolute 0.0 0.0 - 0.1 K/uL    Radiology/Results: No results found.  Anti-infectives: Anti-infectives    Start     Dose/Rate Route Frequency Ordered Stop   04/12/16 2200  cefoTEtan (CEFOTAN) 2 g in dextrose 5 % 50 mL IVPB     2 g 100 mL/hr over 30 Minutes Intravenous Every 12 hours 04/12/16 1430 04/12/16 2306   04/12/16 0838  cefoTEtan (CEFOTAN) 2 g in dextrose 5 % 50 mL IVPB     2 g 100 mL/hr over 30 Minutes  Intravenous On call to O.R. 04/12/16 0839 04/12/16 1025      Assessment/Plan: Problem List: Patient Active Problem List   Diagnosis Date Noted  . Status post colostomy takedown 04/12/2016  . Chronic constipation 12/03/2015  . Peritonitis with abscess of intestine ([place]) 11/29/2015  . COPD (chronic obstructive pulmonary disease) ([place]) 09/20/2015  . Osteopenia 06/10/2014  . Vitamin D deficiency disease 06/10/2014  . Hypothyroidism   . Depression 11/18/2012  . Insomnia 11/18/2012  . Chronic pain syndrome 11/18/2012  . HLD (hyperlipidemia) 09/01/2012  . GAD (generalized anxiety disorder) 09/01/2012    Class: Chronic  . S/P colostomy ([place]) 04/19/2012  . Normocytic anemia 10/15/2011  . Hypertension 10/13/2011  . Chronic back pain 10/13/2011    Passed gas and small BM.  Will start clear liquids 2 Days Post-Op    LOS: 2 days   Matt B. Hassell Done, MD, [facility] [phone] beeper [phone]  04/14/2016 8:38 AM

## 2016-04-14 NOTE — NC FL2 (Signed)
[place] LEVEL OF CARE SCREENING TOOL     IDENTIFICATION  Patient Name: Stacy Dalton Birthdate: 02/26/1951 Sex: female Admission Date (Current Location): 04/12/2016  [facility] and [place] Number:  Herbalist and Address:  [facility],  [place], [place]      Provider Number: [id number]  Attending Physician Name and Address:  Johnathan Hausen, MD  Relative Name and Phone Number:       Current Level of Care: Hospital Recommended Level of Care:   Prior Approval Number:    Date Approved/Denied:   PASRR Number: [id number]  Discharge Plan: SNF    Current Diagnoses: Patient Active Problem List   Diagnosis Date Noted  . Status post colostomy takedown 04/12/2016  . Chronic constipation 12/03/2015  . Peritonitis with abscess of intestine ([place]) 11/29/2015  . COPD (chronic obstructive pulmonary disease) ([place]) 09/20/2015  . Osteopenia 06/10/2014  . Vitamin D deficiency disease 06/10/2014  . Hypothyroidism   . Depression 11/18/2012  . Insomnia 11/18/2012  . Chronic pain syndrome 11/18/2012  . HLD (hyperlipidemia) 09/01/2012  . GAD (generalized anxiety disorder) 09/01/2012    Class: Chronic  . S/P colostomy ([place]) 04/19/2012  . Normocytic anemia 10/15/2011  . Hypertension 10/13/2011  . Chronic back pain 10/13/2011    Orientation RESPIRATION BLADDER Height & Weight     Self, Time, Situation, Place  Normal Continent Weight: 124 lb (56.2 kg) Height:  5\' 4"  (162.6 cm)  BEHAVIORAL SYMPTOMS/MOOD NEUROLOGICAL BOWEL NUTRITION STATUS      Continent Diet (clear liquid)  AMBULATORY STATUS COMMUNICATION OF NEEDS Skin   Supervision Verbally Surgical wounds, Wound Vac                       Personal Care Assistance Level of Assistance  Bathing, Feeding, Dressing Bathing Assistance: Limited assistance Feeding assistance: Independent Dressing Assistance: Limited assistance     Functional Limitations Info  Sight,  Hearing, Speech Sight Info: Adequate Hearing Info: Adequate Speech Info: Adequate    SPECIAL CARE FACTORS FREQUENCY  PT (By licensed PT)     PT Frequency: Min 3x/week              Contractures Contractures Info: Not present    Additional Factors Info  Allergies, Psychotropic   Allergies Info: Toradol Ketorolac Tromethamine, Demerol, Esgic Butalbital-apap-caffeine, Iodine Psychotropic Info: Cymbalta, Xanax         Current Medications (04/14/2016):  This is the current hospital active medication list Current Facility-Administered Medications  Medication Dose Route Frequency Provider Last Rate Last Dose  . acetaminophen (TYLENOL) tablet 1,000 mg  1,000 mg Oral TID Michael Boston, MD   1,000 mg at 04/14/16 1051  . ALPRAZolam [name]) tablet 0.5 mg  0.5 mg Oral Daily PRN Michael Boston, MD   0.5 mg at 04/14/16 0829  . alum & mag hydroxide-simeth (MAALOX/MYLANTA) 200-200-20 MG/5ML suspension 30 mL  30 mL Oral Q6H PRN Michael Boston, MD      . dextrose 5 % and 0.45 % NaCl with KCl 20 mEq/L infusion   Intravenous Continuous Michael Boston, MD 75 mL/hr at 04/14/16 0659 75 mL/hr at 04/14/16 0659  . diphenhydrAMINE (BENADRYL) 12.5 MG/5ML elixir 12.5 mg  12.5 mg Oral Q6H PRN Johnathan Hausen, MD       Or  . diphenhydrAMINE (BENADRYL) injection 12.5 mg  12.5 mg Intravenous Q6H PRN Johnathan Hausen, MD      . DULoxetine (CYMBALTA) DR capsule 60 mg  60 mg Oral  Daily Michael Boston, MD   60 mg at 04/14/16 1051  . fluticasone (FLONASE) 50 MCG/ACT nasal spray 2 spray  2 spray Each Nare Daily Michael Boston, MD      . heparin injection 5,000 Units  5,000 Units Subcutaneous Q8H Johnathan Hausen, MD   5,000 Units at 04/14/16 [phone]  . HYDROmorphone (DILAUDID) 1 mg/mL PCA injection   Intravenous Q4H Johnathan Hausen, MD   3.3 mg at 04/14/16 1122  . ipratropium-albuterol (DUONEB) 0.5-2.5 (3) MG/3ML nebulizer solution 3 mL  3 mL Nebulization Q6H PRN Johnathan Hausen, MD      . lactated ringers bolus 1,000 mL  1,000 mL  Intravenous Q8H PRN Michael Boston, MD      . levothyroxine (SYNTHROID, LEVOTHROID) tablet 50 mcg  50 mcg Oral QAC breakfast Michael Boston, MD   50 mcg at 04/14/16 0801  . lip balm (CARMEX) ointment 1 application  1 application Topical BID Michael Boston, MD   1 application at [phone]  . magic mouthwash  15 mL Oral QID PRN Michael Boston, MD      . menthol-cetylpyridinium (CEPACOL) lozenge 3 mg  1 lozenge Oral PRN Michael Boston, MD      . methocarbamol (ROBAXIN) 1,000 mg in dextrose 5 % 50 mL IVPB  1,000 mg Intravenous Q6H PRN Michael Boston, MD      . metoprolol (LOPRESSOR) injection 5 mg  5 mg Intravenous Q6H PRN Michael Boston, MD      . mometasone-formoterol [facility] [place]) 100-5 MCG/ACT inhaler 2 puff  2 puff Inhalation BID Michael Boston, MD      . naloxone [facility]) injection 0.4 mg  0.4 mg Intravenous PRN Johnathan Hausen, MD       And  . sodium chloride flush (NS) 0.9 % injection 9 mL  9 mL Intravenous PRN Johnathan Hausen, MD      . ondansetron [facility]) tablet 4 mg  4 mg Oral Q6H PRN Johnathan Hausen, MD       Or  . ondansetron [facility]) injection 4 mg  4 mg Intravenous Q6H PRN Johnathan Hausen, MD   4 mg at 04/14/16 0057  . phenol (CHLORASEPTIC) mouth spray 2 spray  2 spray Mouth/Throat PRN Michael Boston, MD      . prochlorperazine (COMPAZINE) injection 5-10 mg  5-10 mg Intravenous Q4H PRN Michael Boston, MD      . saccharomyces boulardii (FLORASTOR) capsule 250 mg  250 mg Oral BID Johnathan Hausen, MD   250 mg at 04/14/16 1051  . triamcinolone (KENALOG) 0.025 % ointment 1 application  1 application Topical BID PRN Michael Boston, MD      . zolpidem (AMBIEN) tablet 5 mg  5 mg Oral QHS PRN Michael Boston, MD         Discharge Medications: Please see discharge summary for a list of discharge medications.  Relevant Imaging Results:  Relevant Lab Results:   Additional Information [id number]  Drema Balzarine D, [place]

## 2016-04-14 NOTE — Clinical Social Work Note (Signed)
Clinical Social Work Assessment  Patient Details  Name: Stacy Dalton MRN: [id number] Date of Birth: 05/14/1951  Date of referral:  04/14/16               Reason for consult:  Facility Placement                Permission sought to share information with:    Permission granted to share information::  Yes, Verbal Permission Granted  Name::        Agency::  [facility]  Relationship::     Contact Information:     Housing/Transportation Living arrangements for the past 2 months:  Single Family Home Source of Information:  Patient Patient Interpreter Needed:  None Criminal Activity/Legal Involvement Pertinent to Current Situation/Hospitalization:  No - Comment as needed Significant Relationships:  Other Family Members, Adult Children Lives with:  Self Do you feel safe going back to the place where you live?  Yes Need for family participation in patient care:  No (Coment)  Care giving concerns:  Pt lives alone   Facilities manager / plan:  CSW met with pt.  CSW introduced self and explained role of CSW.  Pt stated she lives at home alone. Her husband died in 08/29/12.   She has extended family that lives locally and is supportive.  Pt explained that she was previously hospitalized in May 2017 and discharged to [facility] in [place].  CSW explained that PT has recommended SNF with the possibility of HHPT if pt continues to make progress.  Pt stated if she needs to go to SNF that she prefers to go to [facility] again.  CSW explained that she could begin the referral process and contact [facility].  CSW will continue to follow and assist with dc planning needs   Employment status:  Retired Forensic scientist:  Medicare PT Recommendations:  [place] / Referral to community resources:  [place]  Patient/Family's Response to care:  Pt was appreciative of CSW assistance.  Patient/Family's Understanding of and Emotional Response to Diagnosis,  Current Treatment, and Prognosis:  Pt acknowledged that she is gaining strength but as of today would not be able to d/c home.  She is flexible with d/c plans and stated she will make a final decision closer to d/c date.  Emotional Assessment Appearance:  Appears stated age, Well-Groomed Attitude/Demeanor/Rapport:  Other (Cooperative) Affect (typically observed):  Accepting, Calm, Pleasant Orientation:  Oriented to Self, Oriented to Place, Oriented to  Time, Oriented to Situation Alcohol / Substance use:  Not Applicable Psych involvement (Current and /or in the community):  No (Comment)  Discharge Needs  Concerns to be addressed:    Readmission within the last 30 days:  No Current discharge risk:  Lives alone Barriers to Discharge:  Continued Medical Work up   [place], [place], LCSW 04/14/2016, 1:38 PM

## 2016-04-15 NOTE — Progress Notes (Signed)
3 Days Post-Op  Subjective: No complaints. Having small bm's  Objective: Vital signs in last 24 hours: Temp:  [97.9 F (36.6 C)-98.6 F (37 C)] 98.4 F (36.9 C) (10/14 0550) Pulse Rate:  [58-73] 73 (10/14 0550) Resp:  [10-20] 13 (10/14 0800) BP: (96-124)/(44-71) 114/65 (10/14 0550) SpO2:  [97 %-100 %] 100 % (10/14 0924) Last BM Date: 04/13/16  Intake/Output from previous day: 10/13 0701 - 10/14 0700 In: 2055 [P.O.:480; I.V.:1575] Out: 200 [Urine:200] Intake/Output this shift: No intake/output data recorded.  Resp: clear to auscultation bilaterally Cardio: regular rate and rhythm GI: soft, moderate tenderness. vac in place  Lab Results:   Recent Labs  04/13/16 0457 04/14/16 0453  WBC 18.0* 10.1  HGB 9.9* 8.2*  HCT 29.2* 25.5*  PLT 276 187   BMET  Recent Labs  04/12/16 1517 04/13/16 0457  NA  --  137  K  --  4.0  CL  --  99*  CO2  --  30  GLUCOSE  --  138*  BUN  --  9  CREATININE 0.93 0.84  CALCIUM  --  8.7*   PT/INR No results for input(s): LABPROT, INR in the last 72 hours. ABG No results for input(s): PHART, HCO3 in the last 72 hours.  Invalid input(s): PCO2, PO2  Studies/Results: No results found.  Anti-infectives: Anti-infectives    Start     Dose/Rate Route Frequency Ordered Stop   04/12/16 2200  cefoTEtan (CEFOTAN) 2 g in dextrose 5 % 50 mL IVPB     2 g 100 mL/hr over 30 Minutes Intravenous Every 12 hours 04/12/16 1430 04/12/16 2306   04/12/16 0838  cefoTEtan (CEFOTAN) 2 g in dextrose 5 % 50 mL IVPB     2 g 100 mL/hr over 30 Minutes Intravenous On call to O.R. 04/12/16 0839 04/12/16 1025      Assessment/Plan: s/p Procedure(s): HARTMAN TAKEDOWN (N/A) Advance diet. Start fulls today Ambulate Continue wound vac  LOS: 3 days    TOTH III,Shadana Pry S 04/15/2016

## 2016-04-16 MED ORDER — OXYCODONE HCL 5 MG PO TABS
5.0000 mg | ORAL_TABLET | ORAL | Status: DC | PRN
  Administered 2016-04-16 (×2): 10 mg via ORAL
  Administered 2016-04-16: 5 mg via ORAL
  Administered 2016-04-16: 10 mg via ORAL
  Administered 2016-04-16: 5 mg via ORAL
  Administered 2016-04-17 – 2016-04-18 (×10): 10 mg via ORAL
  Filled 2016-04-16 (×9): qty 2
  Filled 2016-04-16: qty 1
  Filled 2016-04-16 (×3): qty 2
  Filled 2016-04-16: qty 1
  Filled 2016-04-16: qty 2

## 2016-04-16 MED ORDER — OXYCODONE-ACETAMINOPHEN 5-325 MG PO TABS: 1.0000 | ORAL_TABLET | ORAL | Status: DC | PRN

## 2016-04-16 NOTE — Progress Notes (Signed)
4 Days Post-Op  Subjective: Feeling better. Tolerating fulls  Objective: Vital signs in last 24 hours: Temp:  [97.5 F (36.4 C)-98.6 F (37 C)] 98.5 F (36.9 C) (10/15 0440) Pulse Rate:  [64-80] 69 (10/15 0440) Resp:  [11-16] 12 (10/15 0747) BP: (106-128)/(45-62) 120/62 (10/15 0440) SpO2:  [93 %-100 %] 95 % (10/15 0747) Last BM Date: 04/15/16  Intake/Output from previous day: 10/14 0701 - 10/15 0700 In: 2300 [P.O.:480; I.V.:1820] Out: 2875 [Urine:2875] Intake/Output this shift: No intake/output data recorded.  Resp: clear to auscultation bilaterally Cardio: regular rate and rhythm GI: soft, mild to moderate tenderness. good bs. having bm's  Lab Results:   Recent Labs  04/14/16 0453  WBC 10.1  HGB 8.2*  HCT 25.5*  PLT 187   BMET No results for input(s): NA, K, CL, CO2, GLUCOSE, BUN, CREATININE, CALCIUM in the last 72 hours. PT/INR No results for input(s): LABPROT, INR in the last 72 hours. ABG No results for input(s): PHART, HCO3 in the last 72 hours.  Invalid input(s): PCO2, PO2  Studies/Results: No results found.  Anti-infectives: Anti-infectives    Start     Dose/Rate Route Frequency Ordered Stop   04/12/16 2200  cefoTEtan (CEFOTAN) 2 g in dextrose 5 % 50 mL IVPB     2 g 100 mL/hr over 30 Minutes Intravenous Every 12 hours 04/12/16 1430 04/12/16 2306   04/12/16 0838  cefoTEtan (CEFOTAN) 2 g in dextrose 5 % 50 mL IVPB     2 g 100 mL/hr over 30 Minutes Intravenous On call to O.R. 04/12/16 0839 04/12/16 1025      Assessment/Plan: s/p Procedure(s): HARTMAN TAKEDOWN (N/A) Advance diet. Start soft foods today D/c pca and start oral pain meds Ambulate Continue vac on incision  LOS: 4 days    TOTH III,Jatniel Verastegui S 04/16/2016

## 2016-04-17 NOTE — Clinical Social Work Note (Signed)
MSW met with patient at bedside and presented bed offers. Patient chooses and has bed at [facility].   Facility notified of potential discharge on 10/16.   [place]on A[place], MSW [phone] 04/17/2016 10:42 AM

## 2016-04-17 NOTE — Progress Notes (Signed)
Physical Therapy Treatment Patient Details Name: Stacy Dalton MRN: [id number] DOB: July 07, 1950 Today's Date: 05/11/16    History of Present Illness Pt is a 65 y/o female with a PMH of HTN, osteopenia, depression, anxiety, GERD and COPD admitted for colostomy takedown.    PT Comments    Progressing well  Follow Up Recommendations  SNF     Equipment Recommendations  None recommended by PT    Recommendations for Other Services       Precautions / Restrictions Precautions Precautions: Fall Restrictions Weight Bearing Restrictions: No    Mobility  Bed Mobility Overal bed mobility: Needs Assistance Bed Mobility: Supine to Sit;Sit to Supine     Supine to sit: Supervision Sit to supine: Supervision   General bed mobility comments: incr time, supervision for lines  Transfers Overall transfer level: Needs assistance Equipment used: None Transfers: Sit to/from Stand Sit to Stand: Supervision         General transfer comment: supervision for safety, lines  Ambulation/Gait Ambulation/Gait assistance: Supervision;Min guard Ambulation Distance (Feet): 400 Feet Assistive device: Rolling walker (2 wheeled) Gait Pattern/deviations: Step-through pattern     General Gait Details: min/guard for safety, balance, no over  LOB but drifting R/L at times   Stairs            Wheelchair Mobility    Modified Rankin (Stroke Patients Only)       Balance                                    Cognition Arousal/Alertness: Awake/alert Behavior During Therapy: WFL for tasks assessed/performed Overall Cognitive Status: Within Functional Limits for tasks assessed                      Exercises General Exercises - Lower Extremity Hip ABduction/ADduction: Standing;10 reps;Both Heel Raises: AROM;Strengthening;10 reps;Standing;Both Other Exercises Other Exercises: hamstring curls x 10 bil in standing    General Comments        Pertinent  Vitals/Pain Pain Assessment: 0-10 Pain Score: 3  Pain Location: R lower abd Pain Descriptors / Indicators: Grimacing;Discomfort Pain Intervention(s): Limited activity within patient's tolerance;Monitored during session    Home Living                      Prior Function            PT Goals (current goals can now be found in the care plan section) Acute Rehab PT Goals Patient Stated Goal: regain strength and decrease pain Time For Goal Achievement: 04/21/16 Potential to Achieve Goals: Good Progress towards PT goals: Progressing toward goals    Frequency    Min 3X/week      PT Plan Current plan remains appropriate    Co-evaluation             End of Session   Activity Tolerance: Patient tolerated treatment well Patient left: in bed;with call bell/phone within reach     Time: [id number] PT Time Calculation (min) (ACUTE ONLY): 15 min  Charges:  $Gait Training: 8-22 mins                    G Codes:      Tien Spooner 05/11/2016, 12:33 PM

## 2016-04-18 DIAGNOSIS — M6281 Muscle weakness (generalized): Secondary | ICD-10-CM | POA: Diagnosis not present

## 2016-04-18 DIAGNOSIS — J449 Chronic obstructive pulmonary disease, unspecified: Secondary | ICD-10-CM | POA: Diagnosis not present

## 2016-04-18 DIAGNOSIS — Z933 Colostomy status: Secondary | ICD-10-CM | POA: Diagnosis not present

## 2016-04-18 DIAGNOSIS — Z9889 Other specified postprocedural states: Secondary | ICD-10-CM | POA: Diagnosis not present

## 2016-04-18 DIAGNOSIS — R1084 Generalized abdominal pain: Secondary | ICD-10-CM | POA: Diagnosis not present

## 2016-04-18 DIAGNOSIS — I10 Essential (primary) hypertension: Secondary | ICD-10-CM | POA: Diagnosis not present

## 2016-04-18 DIAGNOSIS — K651 Peritoneal abscess: Secondary | ICD-10-CM | POA: Diagnosis not present

## 2016-04-18 DIAGNOSIS — Z48815 Encounter for surgical aftercare following surgery on the digestive system: Secondary | ICD-10-CM | POA: Diagnosis not present

## 2016-04-18 DIAGNOSIS — Z4801 Encounter for change or removal of surgical wound dressing: Secondary | ICD-10-CM | POA: Diagnosis not present

## 2016-04-18 DIAGNOSIS — K631 Perforation of intestine (nontraumatic): Secondary | ICD-10-CM | POA: Diagnosis not present

## 2016-04-18 DIAGNOSIS — R2689 Other abnormalities of gait and mobility: Secondary | ICD-10-CM | POA: Diagnosis not present

## 2016-04-18 DIAGNOSIS — R41841 Cognitive communication deficit: Secondary | ICD-10-CM | POA: Diagnosis not present

## 2016-04-18 NOTE — Clinical Social Work Placement (Addendum)
   CLINICAL SOCIAL WORK PLACEMENT  NOTE  Date:  04/18/2016  Patient Details  Name: Stacy Dalton MRN: [id number] Date of Birth: April 09, 1951  Clinical Social Work is seeking post-discharge placement for this patient at the [place] level of care (*CSW will initial, date and re-position this form in  chart as items are completed):  Yes   Patient/family provided with [facility]'s list of facilities offering this level of care within the geographic area requested by the patient (or if unable, by the patient's family).  Yes   Patient/family informed of their freedom to choose among providers that offer the needed level of care, that participate in Medicare, Medicaid or managed care program needed by the patient, have an available bed and are willing to accept the patient.  Yes   Patient/family informed of [facility]'s ownership interest in [facility] and [facility], as well as of the fact that they are under no obligation to receive care at these facilities.  PASRR submitted to EDS on       PASRR number received on       Existing PASRR number confirmed on       FL2 transmitted to all facilities in geographic area requested by pt/family on       FL2 transmitted to all facilities within larger geographic area on 04/18/16     Patient informed that his/her managed care company has contracts with or will negotiate with certain facilities, including the following:  [facility], [place]         Patient/family informed of bed offers received.  Patient chooses bed at [facility], [place]     Physician recommends and patient chooses bed at [place], [place]    Patient to be transferred to [place] [facility], [place] on 04/18/16.  Patient to be transferred to facility by [facility]     Patient family notified on 04/18/16 of transfer.  Name of family member notified:     Left Voicemail for Son.    PHYSICIAN        Additional Comment:    _______________________________________________ Lia Hopping, LCSW 04/18/2016, 2:06 PM

## 2016-04-18 NOTE — Progress Notes (Signed)
Patient to be discharged to [facility], report given to Wynelle Beckmann RN

## 2016-04-18 NOTE — Consult Note (Signed)
[facility] Nurse wound consult note Reason for Consult: discontinue Prevena NPWT device and top wound with dry dressing.  Provide orders for SNF for wound care to ostomy takedown site on 04/20/16 Wound type:Surgical Pressure Ulcer POA: No Measurement:14cm closely approximated midline incision with ostomy takedown site in the RUQ with gauze wick in place Wound bed:As noted above Drainage (amount, consistency, odor) Serous with scant serosanguinous at proximal end of midline incision Periwound:intact, clear Dressing procedure/placement/frequency:Dry dressings to midline incision daily.  SNF RN to remove wick on Thursday, dressing ostomy take down site with saline dressing covered with dry gauze and changed daily. [facility] nursing team will not follow, but will remain available to this patient, the nursing and medical teams.  Please re-consult if needed. Thanks, Maudie Flakes, MSN, RN, [place], Arther Abbott  Pager# [phone]

## 2016-04-18 NOTE — Discharge Summary (Signed)
Physician Discharge Summary  Patient ID: Stacy Dalton MRN: [id number] DOB/AGE: 67/17/1952 67 y.o.  Admit date: 04/12/2016 Discharge date: 04/18/2016  Admission Diagnoses:  Colostomy closure needed (patient with [facility])  Discharge Diagnoses:  same  Active Problems:   Status post colostomy takedown   Surgery:  Colostomy takedown  Discharged Condition: improved.  Going to [facility] to help with wound care  Hospital Course:   Had takedown of colostomy and reanastomosis.  Tolerating diet.  Incision with Prevena3  Consults: wound ostomy3  Significant Diagnostic Studies: none    Discharge Exam: Blood pressure 133/69, pulse 64, temperature 98.2 F (36.8 C), temperature source Oral, resp. rate 16, height 5\' 4"  (1.626 m), weight 56.2 kg (124 lb), SpO2 98 %. Incisions covered with Prevena and look good  Disposition: 03-Skilled Nursing Facility  Discharge Instructions    Call MD for:  persistant nausea and vomiting    Complete by:  As directed    Diet - low sodium heart healthy    Complete by:  As directed    Discharge wound care:    Complete by:  As directed    Maintain Prevena suction to incision   Increase activity slowly    Complete by:  As directed        Medication List    TAKE these medications   ALPRAZolam 0.5 MG tablet Commonly known as:  XANAX Take 1 tablet (0.5 mg total) by mouth daily as needed for anxiety.   atorvastatin 40 MG tablet Commonly known as:  LIPITOR TAKE ONE-HALF (1/2) TABLET DAILY (PLEASE NOTE CHANGE IN DIRECTIONS)   AZOR 10-40 MG tablet Generic drug:  amLODipine-olmesartan TAKE 1 TABLET DAILY   ciprofloxacin 500 MG tablet Commonly known as:  CIPRO Take 1 tablet (500 mg total) by mouth 2 (two) times daily.   COMBIVENT RESPIMAT 20-100 MCG/ACT Aers respimat Generic drug:  Ipratropium-Albuterol Inhale 1 puff into the lungs every 6 (six) hours as needed for wheezing.   docusate sodium 100 MG capsule Commonly known as:  COLACE Take 1  capsule (100 mg total) by mouth 2 (two) times daily as needed for mild constipation.   DULoxetine 60 MG capsule Commonly known as:  CYMBALTA TAKE 1 CAPSULE DAILY   fluticasone 50 MCG/ACT nasal spray Commonly known as:  FLONASE Place 2 sprays into both nostrils daily. What changed:  when to take this  reasons to take this   Fluticasone-Salmeterol 100-50 MCG/DOSE Aepb Commonly known as:  ADVAIR DISKUS Inhale 1 puff into the lungs 2 (two) times daily.   HYDROcodone-acetaminophen 5-325 MG tablet Commonly known as:  NORCO/VICODIN Take 1 tablet by mouth every 8 (eight) hours as needed for moderate pain.   HYDROcodone-homatropine 5-1.5 MG/5ML syrup Commonly known as:  HYCODAN Take 5 mLs by mouth every 8 (eight) hours as needed for cough.   levothyroxine 50 MCG tablet Commonly known as:  SYNTHROID, LEVOTHROID TAKE 1 TABLET DAILY   ondansetron 4 MG tablet Commonly known as:  ZOFRAN Take 1 tablet (4 mg total) by mouth every 8 (eight) hours as needed for nausea or vomiting.   polyethylene glycol packet Commonly known as:  MIRALAX / GLYCOLAX Take 17 g by mouth daily as needed for mild constipation or moderate constipation.   traMADol 50 MG tablet Commonly known as:  ULTRAM Take 50 mg by mouth 3 (three) times daily as needed. For pain.   triamcinolone 0.025 % ointment Commonly known as:  KENALOG Apply 1 application topically 2 (two) times daily as needed. For skin  irritation.   triamterene-hydrochlorothiazide 37.5-25 MG capsule Commonly known as:  DYAZIDE TAKE 1 CAPSULE DAILY   Vitamin D (Ergocalciferol) 50000 units Caps capsule Commonly known as:  DRISDOL TAKE 1 CAPSULE EVERY 7 DAYS   zolpidem 12.5 MG CR tablet Commonly known as:  AMBIEN CR TAKE ONE TABLET BY MOUTH AT BEDTIME AS NEEDED What changed:  See the new instructions.        SignedJohnathan Hausen B 04/18/2016, 11:10 AM

## 2016-04-18 NOTE — Care Management Important Message (Signed)
Important Message  Patient Details  Name: Stacy Dalton MRN: [id number] Date of Birth: 10-11-1950   Medicare Important Message Given:  Yes    [name] 04/18/2016, 12:34 [place] Message  Patient Details  Name: Stacy Dalton MRN: [id number] Date of Birth: 01/20/1951   Medicare Important Message Given:  Yes    [name] 04/18/2016, 12:34 PM

## 2016-04-19 DIAGNOSIS — Z933 Colostomy status: Secondary | ICD-10-CM | POA: Diagnosis not present

## 2016-04-19 DIAGNOSIS — I10 Essential (primary) hypertension: Secondary | ICD-10-CM | POA: Diagnosis not present

## 2016-04-24 ENCOUNTER — Other Ambulatory Visit: Payer: Self-pay | Admitting: Family Medicine

## 2016-04-26 ENCOUNTER — Telehealth: Payer: Self-pay | Admitting: Family

## 2016-04-26 DIAGNOSIS — Z933 Colostomy status: Secondary | ICD-10-CM | POA: Diagnosis not present

## 2016-04-26 DIAGNOSIS — I10 Essential (primary) hypertension: Secondary | ICD-10-CM | POA: Diagnosis not present

## 2016-04-26 NOTE — Telephone Encounter (Signed)
appt scheduled for nursing home follow up Pt notified

## 2016-04-27 NOTE — Telephone Encounter (Signed)
Disregard encounter

## 2016-04-29 DIAGNOSIS — F329 Major depressive disorder, single episode, unspecified: Secondary | ICD-10-CM | POA: Diagnosis not present

## 2016-04-29 DIAGNOSIS — I10 Essential (primary) hypertension: Secondary | ICD-10-CM | POA: Diagnosis not present

## 2016-04-29 DIAGNOSIS — E785 Hyperlipidemia, unspecified: Secondary | ICD-10-CM | POA: Diagnosis not present

## 2016-04-29 DIAGNOSIS — Z48815 Encounter for surgical aftercare following surgery on the digestive system: Secondary | ICD-10-CM | POA: Diagnosis not present

## 2016-04-29 DIAGNOSIS — M545 Low back pain: Secondary | ICD-10-CM | POA: Diagnosis not present

## 2016-04-29 DIAGNOSIS — E559 Vitamin D deficiency, unspecified: Secondary | ICD-10-CM | POA: Diagnosis not present

## 2016-04-29 DIAGNOSIS — M6281 Muscle weakness (generalized): Secondary | ICD-10-CM | POA: Diagnosis not present

## 2016-04-30 ENCOUNTER — Other Ambulatory Visit: Payer: Self-pay | Admitting: Family Medicine

## 2016-05-01 ENCOUNTER — Telehealth: Payer: Self-pay | Admitting: Family Medicine

## 2016-05-01 ENCOUNTER — Other Ambulatory Visit: Payer: Self-pay | Admitting: Family

## 2016-05-01 DIAGNOSIS — I10 Essential (primary) hypertension: Secondary | ICD-10-CM | POA: Diagnosis not present

## 2016-05-01 DIAGNOSIS — F329 Major depressive disorder, single episode, unspecified: Secondary | ICD-10-CM | POA: Diagnosis not present

## 2016-05-01 DIAGNOSIS — E559 Vitamin D deficiency, unspecified: Secondary | ICD-10-CM | POA: Diagnosis not present

## 2016-05-01 DIAGNOSIS — E785 Hyperlipidemia, unspecified: Secondary | ICD-10-CM | POA: Diagnosis not present

## 2016-05-01 DIAGNOSIS — M545 Low back pain: Secondary | ICD-10-CM | POA: Diagnosis not present

## 2016-05-01 DIAGNOSIS — Z48815 Encounter for surgical aftercare following surgery on the digestive system: Secondary | ICD-10-CM | POA: Diagnosis not present

## 2016-05-01 MED ORDER — TRAMADOL HCL 50 MG PO TABS: 50.0000 mg | ORAL_TABLET | Freq: Three times a day (TID) | ORAL | 2 refills | Status: DC | PRN

## 2016-05-01 NOTE — Telephone Encounter (Signed)
Prescription was called in to [facility] per [place].  Tramadol 50 mg, one TID as needed, #90, with 2 refills.  Patient aware.

## 2016-05-01 NOTE — Telephone Encounter (Signed)
Ultram rx refilled

## 2016-05-01 NOTE — Telephone Encounter (Signed)
After hours call  Patient calling in stating that she just filled her tramadol today, when she went back to her side table where it was it was gone. There was a 1 person in her house today and that was her therapist.  She is not accusing her therapist but is very pressure-treated that she cannot find her medication.  I have not left the office yet and have reviewed the controlled substance database, it appears that she filled a 5 mg hydrocodone prescription 5 days ago. She has looked through her medicine box and found the hydrocodone which should provide plenty of pain relief for the night.  I will defer the decision about refilling her tramadol or not to her PCP tomorrow.  Laroy Apple, MD [facility] 05/01/2016, 7:30 PM

## 2016-05-02 DIAGNOSIS — M545 Low back pain: Secondary | ICD-10-CM | POA: Diagnosis not present

## 2016-05-02 DIAGNOSIS — E559 Vitamin D deficiency, unspecified: Secondary | ICD-10-CM | POA: Diagnosis not present

## 2016-05-02 DIAGNOSIS — E785 Hyperlipidemia, unspecified: Secondary | ICD-10-CM | POA: Diagnosis not present

## 2016-05-02 DIAGNOSIS — F329 Major depressive disorder, single episode, unspecified: Secondary | ICD-10-CM | POA: Diagnosis not present

## 2016-05-02 DIAGNOSIS — Z48815 Encounter for surgical aftercare following surgery on the digestive system: Secondary | ICD-10-CM | POA: Diagnosis not present

## 2016-05-02 DIAGNOSIS — I10 Essential (primary) hypertension: Secondary | ICD-10-CM | POA: Diagnosis not present

## 2016-05-04 ENCOUNTER — Ambulatory Visit: Payer: Medicare Other | Admitting: Family

## 2016-05-04 NOTE — Telephone Encounter (Signed)
Per our office policy, lost or stolen medications can not refill. Sorry

## 2016-05-05 ENCOUNTER — Encounter: Payer: Self-pay | Admitting: Family

## 2016-05-05 ENCOUNTER — Ambulatory Visit (INDEPENDENT_AMBULATORY_CARE_PROVIDER_SITE_OTHER): Payer: Medicare Other | Admitting: Family

## 2016-05-05 VITALS — BP 112/69 | HR 78 | Temp 97.7°F | Ht 64.0 in | Wt 127.2 lb

## 2016-05-05 DIAGNOSIS — Z09 Encounter for follow-up examination after completed treatment for conditions other than malignant neoplasm: Secondary | ICD-10-CM

## 2016-05-05 DIAGNOSIS — E785 Hyperlipidemia, unspecified: Secondary | ICD-10-CM | POA: Diagnosis not present

## 2016-05-05 DIAGNOSIS — K651 Peritoneal abscess: Secondary | ICD-10-CM | POA: Diagnosis not present

## 2016-05-05 DIAGNOSIS — F329 Major depressive disorder, single episode, unspecified: Secondary | ICD-10-CM | POA: Diagnosis not present

## 2016-05-05 DIAGNOSIS — I10 Essential (primary) hypertension: Secondary | ICD-10-CM | POA: Diagnosis not present

## 2016-05-05 DIAGNOSIS — G894 Chronic pain syndrome: Secondary | ICD-10-CM | POA: Diagnosis not present

## 2016-05-05 DIAGNOSIS — Z48815 Encounter for surgical aftercare following surgery on the digestive system: Secondary | ICD-10-CM | POA: Diagnosis not present

## 2016-05-05 DIAGNOSIS — Z9889 Other specified postprocedural states: Secondary | ICD-10-CM | POA: Diagnosis not present

## 2016-05-05 DIAGNOSIS — Z23 Encounter for immunization: Secondary | ICD-10-CM | POA: Diagnosis not present

## 2016-05-05 DIAGNOSIS — M545 Low back pain: Secondary | ICD-10-CM | POA: Diagnosis not present

## 2016-05-05 DIAGNOSIS — E559 Vitamin D deficiency, unspecified: Secondary | ICD-10-CM | POA: Diagnosis not present

## 2016-05-05 MED ORDER — HYDROCODONE-ACETAMINOPHEN 5-325 MG PO TABS: 1.0000 | ORAL_TABLET | Freq: Three times a day (TID) | ORAL | 0 refills | Status: DC | PRN

## 2016-05-05 MED ORDER — TRAMADOL HCL 50 MG PO TABS: 50.0000 mg | ORAL_TABLET | Freq: Three times a day (TID) | ORAL | 2 refills | Status: DC | PRN

## 2016-05-05 MED ORDER — TRAMADOL HCL 50 MG PO TABS: 50.0000 mg | ORAL_TABLET | Freq: Two times a day (BID) | ORAL | 2 refills | Status: DC | PRN

## 2016-05-05 NOTE — Patient Instructions (Signed)

## 2016-05-05 NOTE — Progress Notes (Addendum)
   Subjective:    Patient ID: Stacy Dalton, female    DOB: December 14, 1950, 65 y.o.   MRN: [id number]  HPI Pt presents to the office today for hospital follow up and SNF placement. PT had a colostomy closure on 04/12/16 and was discharge from the hospital on 04/18/16 to SNF ( [facility]) for one week. Pt states she is at home now doing well. PT states she is having constant pain of 5 out 10 and weakness, but overall feels like she is doing ok. PT is doing dressing changes BID and has home health twice a week to help with dressing changes. PT denies any discharge, erythemas, or warmth.   Review of Systems  Constitutional: Positive for fatigue.  HENT: Negative.   Eyes: Negative.   Respiratory: Negative.   Cardiovascular: Negative.   Gastrointestinal: Positive for abdominal pain.  Genitourinary: Negative.   Musculoskeletal: Negative.        Objective:   Physical Exam  Constitutional: She is oriented to person, place, and time. She appears well-developed and well-nourished. No distress.  HENT:  Head: Normocephalic and atraumatic.  Eyes: Pupils are equal, round, and reactive to light.  Cardiovascular: Normal rate, regular rhythm, normal heart sounds and intact distal pulses.   No murmur heard. Pulmonary/Chest: Effort normal and breath sounds normal. No respiratory distress. She has no wheezes.  Abdominal: Soft. Bowel sounds are normal. She exhibits no distension. There is no tenderness.  abd incision, well approximated, no erythemas, discharge, or warmth present  Musculoskeletal: Normal range of motion. She exhibits no edema or tenderness.  Neurological: She is alert and oriented to person, place, and time.  Skin: Skin is warm and dry.  Psychiatric: She has a normal mood and affect. Her behavior is normal. Judgment and thought content normal.  Vitals reviewed.     BP 112/69   Pulse 78   Temp 97.7 F (36.5 C) (Oral)   Ht _0  (1.626 m)   Wt 127 lb  3.2 oz (57.7 kg)   BMI 21.83 kg/m      Assessment & Plan:  1. Status post colostomy takedown - CMP14+EGFR - CBC with Differential/Platelet - HYDROcodone-acetaminophen (NORCO/VICODIN) 5-325 MG tablet; Take 1 tablet by mouth every 8 (eight) hours as needed for moderate pain.  Dispense: 90 tablet; Refill: 0 - traMADol (ULTRAM) 50 MG tablet; Take 1 tablet (50 mg total) by mouth 3 (three) times daily as needed. For pain.  Dispense: 90 tablet; Refill: 2  2. Chronic pain syndrome - CMP14+EGFR - CBC with Differential/Platelet - HYDROcodone-acetaminophen (NORCO/VICODIN) 5-325 MG tablet; Take 1 tablet by mouth every 8 (eight) hours as needed for moderate pain.  Dispense: 90 tablet; Refill: 0 - traMADol (ULTRAM) 50 MG tablet; Take 1-2 tablet (50 mg total) by mouth BID daily as needed. For pain.  Dispense: 120 tablet; Refill: 2  3. Hospital discharge follow-up - CMP14+EGFR - CBC with Differential/Platelet  4. Peritonitis with abscess of intestine ([place])  Discussed keeping dressing clean and dry Discussed s/s of infection and to contact office if any change in erythemas, drainage, or warmth Only take pain medication as needed RTO prn and keep chronic follow up appts  Evelina Dun, FNP

## 2016-05-05 NOTE — Addendum Note (Signed)
Addended by: Evelina Dun A on: 05/05/2016 11:10 AM   Modules accepted: Orders

## 2016-05-08 ENCOUNTER — Other Ambulatory Visit: Payer: Self-pay | Admitting: Family

## 2016-05-08 DIAGNOSIS — D649 Anemia, unspecified: Secondary | ICD-10-CM

## 2016-05-08 DIAGNOSIS — R7989 Other specified abnormal findings of blood chemistry: Secondary | ICD-10-CM

## 2016-05-08 LAB — CMP14+EGFR
ALT: 14 IU/L (ref 0–32)
AST: 22 IU/L (ref 0–40)
Albumin/Globulin Ratio: 1.7 (ref 1.2–2.2)
Albumin: 4 g/dL (ref 3.6–4.8)
Alkaline Phosphatase: 79 IU/L (ref 39–117)
BUN/Creatinine Ratio: 7 — ABNORMAL LOW (ref 12–28)
BUN: 10 mg/dL (ref 8–27)
Bilirubin Total: 0.2 mg/dL (ref 0.0–1.2)
CO2: 28 mmol/L (ref 18–29)
Calcium: 9.1 mg/dL (ref 8.7–10.3)
Chloride: 95 mmol/L — ABNORMAL LOW (ref 96–106)
Creatinine, Ser: 1.34 mg/dL — ABNORMAL HIGH (ref 0.57–1.00)
GFR calc Af Amer: 47 mL/min/{1.73_m2} — ABNORMAL LOW (ref >59)
GFR calc non Af Amer: 41 mL/min/{1.73_m2} — ABNORMAL LOW (ref >59)
Globulin, Total: 2.3 g/dL (ref 1.5–4.5)
Glucose: 85 mg/dL (ref 65–99)
Potassium: 3.8 mmol/L (ref 3.5–5.2)
Sodium: 140 mmol/L (ref 134–144)
Total Protein: 6.3 g/dL (ref 6.0–8.5)

## 2016-05-08 LAB — CBC WITH DIFFERENTIAL/PLATELET
Basophils Absolute: 0 10*3/uL (ref 0.0–0.2)
Basos: 0 %
EOS (ABSOLUTE): 0.4 10*3/uL (ref 0.0–0.4)
Eos: 7 %
Hematocrit: 27.2 % — ABNORMAL LOW (ref 34.0–46.6)
Hemoglobin: 8.8 g/dL — CL (ref 11.1–15.9)
Immature Grans (Abs): 0 10*3/uL (ref 0.0–0.1)
Immature Granulocytes: 0 %
Lymphocytes Absolute: 1.6 10*3/uL (ref 0.7–3.1)
Lymphs: 26 %
MCH: 26.9 pg (ref 26.6–33.0)
MCHC: 32.4 g/dL (ref 31.5–35.7)
MCV: 83 fL (ref 79–97)
Monocytes Absolute: 0.9 10*3/uL (ref 0.1–0.9)
Monocytes: 14 %
Neutrophils Absolute: 3.2 10*3/uL (ref 1.4–7.0)
Neutrophils: 53 %
Platelets: 357 10*3/uL (ref 150–379)
RBC: 3.27 x10E6/uL — ABNORMAL LOW (ref 3.77–5.28)
RDW: 15.1 % (ref 12.3–15.4)
WBC: 6.2 10*3/uL (ref 3.4–10.8)

## 2016-05-10 DIAGNOSIS — E785 Hyperlipidemia, unspecified: Secondary | ICD-10-CM | POA: Diagnosis not present

## 2016-05-10 DIAGNOSIS — M545 Low back pain: Secondary | ICD-10-CM | POA: Diagnosis not present

## 2016-05-10 DIAGNOSIS — E559 Vitamin D deficiency, unspecified: Secondary | ICD-10-CM | POA: Diagnosis not present

## 2016-05-10 DIAGNOSIS — F329 Major depressive disorder, single episode, unspecified: Secondary | ICD-10-CM | POA: Diagnosis not present

## 2016-05-10 DIAGNOSIS — Z48815 Encounter for surgical aftercare following surgery on the digestive system: Secondary | ICD-10-CM | POA: Diagnosis not present

## 2016-05-10 DIAGNOSIS — I10 Essential (primary) hypertension: Secondary | ICD-10-CM | POA: Diagnosis not present

## 2016-05-11 ENCOUNTER — Other Ambulatory Visit: Payer: Medicare Other

## 2016-05-11 DIAGNOSIS — R7989 Other specified abnormal findings of blood chemistry: Secondary | ICD-10-CM | POA: Diagnosis not present

## 2016-05-11 DIAGNOSIS — D649 Anemia, unspecified: Secondary | ICD-10-CM | POA: Diagnosis not present

## 2016-05-12 ENCOUNTER — Telehealth: Payer: Self-pay | Admitting: Family

## 2016-05-12 LAB — CBC WITH DIFFERENTIAL/PLATELET
Basophils Absolute: 0 10*3/uL (ref 0.0–0.2)
Basos: 0 %
EOS (ABSOLUTE): 0.3 10*3/uL (ref 0.0–0.4)
Eos: 5 %
Hematocrit: 32.4 % — ABNORMAL LOW (ref 34.0–46.6)
Hemoglobin: 10.9 g/dL — ABNORMAL LOW (ref 11.1–15.9)
Immature Grans (Abs): 0 10*3/uL (ref 0.0–0.1)
Immature Granulocytes: 0 %
Lymphocytes Absolute: 2.5 10*3/uL (ref 0.7–3.1)
Lymphs: 48 %
MCH: 27.6 pg (ref 26.6–33.0)
MCHC: 33.6 g/dL (ref 31.5–35.7)
MCV: 82 fL (ref 79–97)
Monocytes Absolute: 0.6 10*3/uL (ref 0.1–0.9)
Monocytes: 12 %
Neutrophils Absolute: 1.8 10*3/uL (ref 1.4–7.0)
Neutrophils: 35 %
Platelets: 380 10*3/uL — ABNORMAL HIGH (ref 150–379)
RBC: 3.95 x10E6/uL (ref 3.77–5.28)
RDW: 15.3 % (ref 12.3–15.4)
WBC: 5.2 10*3/uL (ref 3.4–10.8)

## 2016-05-12 LAB — BMP8+EGFR
BUN/Creatinine Ratio: 10 — ABNORMAL LOW (ref 12–28)
BUN: 9 mg/dL (ref 8–27)
CO2: 27 mmol/L (ref 18–29)
Calcium: 9.4 mg/dL (ref 8.7–10.3)
Chloride: 96 mmol/L (ref 96–106)
Creatinine, Ser: 0.9 mg/dL (ref 0.57–1.00)
GFR calc Af Amer: 77 mL/min/{1.73_m2} (ref >59)
GFR calc non Af Amer: 66 mL/min/{1.73_m2} (ref >59)
Glucose: 80 mg/dL (ref 65–99)
Potassium: 4.1 mmol/L (ref 3.5–5.2)
Sodium: 139 mmol/L (ref 134–144)

## 2016-05-12 NOTE — Telephone Encounter (Signed)
Aware. 

## 2016-05-15 ENCOUNTER — Other Ambulatory Visit: Payer: Self-pay | Admitting: Family

## 2016-05-15 DIAGNOSIS — F331 Major depressive disorder, recurrent, moderate: Secondary | ICD-10-CM

## 2016-05-15 NOTE — Telephone Encounter (Signed)
Looks like it was done

## 2016-05-16 DIAGNOSIS — Z48815 Encounter for surgical aftercare following surgery on the digestive system: Secondary | ICD-10-CM | POA: Diagnosis not present

## 2016-05-16 DIAGNOSIS — I10 Essential (primary) hypertension: Secondary | ICD-10-CM | POA: Diagnosis not present

## 2016-05-16 DIAGNOSIS — E785 Hyperlipidemia, unspecified: Secondary | ICD-10-CM | POA: Diagnosis not present

## 2016-05-16 DIAGNOSIS — E559 Vitamin D deficiency, unspecified: Secondary | ICD-10-CM | POA: Diagnosis not present

## 2016-05-16 DIAGNOSIS — F329 Major depressive disorder, single episode, unspecified: Secondary | ICD-10-CM | POA: Diagnosis not present

## 2016-05-16 DIAGNOSIS — M545 Low back pain: Secondary | ICD-10-CM | POA: Diagnosis not present

## 2016-05-16 MED ORDER — ALPRAZOLAM 0.5 MG PO TABS: 0.5000 mg | ORAL_TABLET | Freq: Two times a day (BID) | ORAL | 3 refills | Status: DC | PRN

## 2016-05-16 NOTE — Telephone Encounter (Signed)
Xanax Rx called to [facility]

## 2016-06-05 ENCOUNTER — Encounter: Payer: Self-pay | Admitting: Family

## 2016-06-05 ENCOUNTER — Ambulatory Visit (INDEPENDENT_AMBULATORY_CARE_PROVIDER_SITE_OTHER): Payer: Medicare Other | Admitting: Family

## 2016-06-05 VITALS — BP 104/66 | HR 96 | Temp 97.7°F | Ht 64.0 in | Wt 112.4 lb

## 2016-06-05 DIAGNOSIS — E559 Vitamin D deficiency, unspecified: Secondary | ICD-10-CM | POA: Diagnosis not present

## 2016-06-05 DIAGNOSIS — M546 Pain in thoracic spine: Secondary | ICD-10-CM

## 2016-06-05 DIAGNOSIS — F411 Generalized anxiety disorder: Secondary | ICD-10-CM

## 2016-06-05 DIAGNOSIS — E039 Hypothyroidism, unspecified: Secondary | ICD-10-CM | POA: Diagnosis not present

## 2016-06-05 DIAGNOSIS — Z9889 Other specified postprocedural states: Secondary | ICD-10-CM | POA: Diagnosis not present

## 2016-06-05 DIAGNOSIS — G8929 Other chronic pain: Secondary | ICD-10-CM

## 2016-06-05 DIAGNOSIS — G4700 Insomnia, unspecified: Secondary | ICD-10-CM | POA: Diagnosis not present

## 2016-06-05 DIAGNOSIS — J449 Chronic obstructive pulmonary disease, unspecified: Secondary | ICD-10-CM

## 2016-06-05 DIAGNOSIS — G894 Chronic pain syndrome: Secondary | ICD-10-CM

## 2016-06-05 DIAGNOSIS — F331 Major depressive disorder, recurrent, moderate: Secondary | ICD-10-CM

## 2016-06-05 DIAGNOSIS — E785 Hyperlipidemia, unspecified: Secondary | ICD-10-CM

## 2016-06-05 DIAGNOSIS — K5909 Other constipation: Secondary | ICD-10-CM | POA: Diagnosis not present

## 2016-06-05 DIAGNOSIS — I10 Essential (primary) hypertension: Secondary | ICD-10-CM

## 2016-06-05 DIAGNOSIS — J209 Acute bronchitis, unspecified: Secondary | ICD-10-CM

## 2016-06-05 MED ORDER — HYDROCODONE-ACETAMINOPHEN 5-325 MG PO TABS: 1.0000 | ORAL_TABLET | Freq: Three times a day (TID) | ORAL | 0 refills | Status: DC | PRN

## 2016-06-05 MED ORDER — PREDNISONE 10 MG (21) PO TBPK: ORAL_TABLET | ORAL | 0 refills | Status: DC

## 2016-06-05 MED ORDER — HYDROCODONE-ACETAMINOPHEN 5-325 MG PO TABS: 1.0000 | ORAL_TABLET | Freq: Four times a day (QID) | ORAL | 0 refills | Status: DC | PRN

## 2016-06-05 MED ORDER — ONDANSETRON HCL 4 MG PO TABS: 4.0000 mg | ORAL_TABLET | Freq: Three times a day (TID) | ORAL | 3 refills | Status: DC | PRN

## 2016-06-05 NOTE — Progress Notes (Signed)
Subjective:    Patient ID: Stacy Dalton, female    DOB: 03/29/1951, 65 y.o.   MRN: [id number]  Pt presents to the office for chronic follow up.   Hyperlipidemia  This is a chronic problem. The current episode started more than 1 year ago. The problem is uncontrolled. Recent lipid tests were reviewed and are high. She has no history of diabetes or hypothyroidism. Associated symptoms include myalgias and shortness of breath ("at times"). Pertinent negatives include no leg pain. Current antihyperlipidemic treatment includes diet change, statins and exercise. The current treatment provides moderate improvement of lipids. Compliance problems include adherence to diet.  Risk factors for coronary artery disease include dyslipidemia, family history, hypertension, post-menopausal, a sedentary lifestyle and stress.  Hypertension  This is a chronic problem. The current episode started more than 1 year ago. The problem has been resolved since onset. The problem is controlled. Associated symptoms include anxiety and shortness of breath ("at times"). Pertinent negatives include no headaches, malaise/fatigue, palpitations or peripheral edema. Risk factors for coronary artery disease include dyslipidemia, post-menopausal state, sedentary lifestyle and stress. Past treatments include diuretics. The current treatment provides moderate improvement. Compliance problems include diet, exercise, psychosocial issues and medication side effects (not taking statin medication due to fear of side effects).  Hypertensive end-organ damage includes a thyroid problem. There is no history of kidney disease, CAD/MI, CVA or heart failure. There is no history of sleep apnea.  Anxiety  Presents for follow-up visit. Onset was 1 to 6 months ago (Son has brain cancer, daughter as substance abuse problems and she is helping take care of grandkids.). The problem has been gradually worsening. Symptoms include depressed mood, excessive worry,  insomnia, nervous/anxious behavior and shortness of breath ("at times"). Patient reports no hyperventilation, irritability or palpitations. Primary symptoms comment: Pt's husband past away in 2015, Acid reflux. Symptoms occur occasionally. The severity of symptoms is moderate and causing significant distress. The symptoms are aggravated by family issues. The patient sleeps 4 hours per night. The quality of sleep is fair. Nighttime awakenings: occasional, several.   Risk factors include a major life event (Son has brain tumor, daughter has substance abuse problems.  She is helping care for grandchildren.). Her past medical history is significant for anxiety/panic attacks and depression. There is no history of CAD. Past treatments include SSRIs and benzodiazephines. The treatment provided mild relief. Compliance with prior treatments has been good.  Thyroid Problem  Presents for follow-up visit. Symptoms include anxiety, depressed mood, dry skin and fatigue. Patient reports no constipation, diarrhea, leg swelling, palpitations or weight loss. The symptoms have been stable. Past treatments include levothyroxine. The treatment provided significant relief. Her past medical history is significant for hyperlipidemia. There is no history of diabetes or heart failure.  Back Pain  This is a chronic problem. The current episode started more than 1 year ago. The problem occurs constantly. The problem has been waxing and waning since onset. The pain is present in the thoracic spine and lumbar spine. The quality of the pain is described as aching. The pain is at a severity of 10/10. The pain is severe. The pain is worse during the night. Exacerbated by: walking. Pertinent negatives include no bladder incontinence, bowel incontinence, fever, headaches, leg pain or weight loss. She has tried analgesics for the symptoms. The treatment provided mild relief.  Insomnia  Primary symptoms: sleep disturbance, no malaise/fatigue.    The current episode started more than one year. The onset quality is gradual. The  problem occurs every several days. The problem has been waxing and waning since onset. The symptoms are aggravated by anxiety. Past treatments include medication. The treatment provided significant relief.  Cough  This is a new problem. The current episode started in the past 7 days. The problem has been waxing and waning. The problem occurs every few minutes. The cough is productive of brown sputum. Associated symptoms include ear congestion, myalgias, nasal congestion, postnasal drip, a sore throat and shortness of breath ("at times"). Pertinent negatives include no fever, headaches, weight loss or wheezing. She has tried OTC cough suppressant for the symptoms. The treatment provided mild relief.  Constipation  This is a chronic problem. The current episode started more than 1 year ago. The problem has been resolved since onset. Her stool frequency is 1 time per day. Associated symptoms include back pain. Pertinent negatives include no diarrhea, fever or weight loss. She has tried laxatives for the symptoms. The treatment provided moderate relief.  COPD  PT was taking advair, but has stopped. Pt states her breathing has become worse, but she is coughing which makes it worse.    Pain assessment: Cause of pain-  Pain location- back Pain on scale of 1-10- 10 Frequency- Intermittent What increases pain-Laying down What makes pain Better-Pain medicaion  Current medications- Ultram but has not been working so she has started her Norc 5-325 mg  Pill count performed-No Urine drug screen- No Was the [facility] reviewed- Yes  If yes were their any concerning findings? - Pt has only received controlled medications from me and her surgeon (once in October).    Review of Systems  Constitutional: Positive for appetite change and fatigue. Negative for fever, irritability, malaise/fatigue and weight loss.  HENT: Positive for  congestion, postnasal drip and sore throat.   Eyes: Negative.   Respiratory: Positive for cough and shortness of breath ("at times"). Negative for wheezing.   Cardiovascular: Negative.  Negative for palpitations.  Gastrointestinal: Negative.  Negative for bowel incontinence, constipation and diarrhea.       Acid reflux  Endocrine: Negative.   Genitourinary: Negative.  Negative for bladder incontinence.  Musculoskeletal: Positive for back pain and myalgias.  Neurological: Negative.  Negative for headaches.  Hematological: Negative.   Psychiatric/Behavioral: Positive for sleep disturbance. The patient is nervous/anxious and has insomnia.   All other systems reviewed and are negative.      Objective:   Physical Exam  Constitutional: She is oriented to person, place, and time. She appears well-developed and well-nourished. No distress.  HENT:  Head: Normocephalic and atraumatic.  Right Ear: External ear normal.  Left Ear: External ear normal.  Nose: Mucosal edema and rhinorrhea present.  Mouth/Throat: Oropharynx is clear and moist.  Eyes: Pupils are equal, round, and reactive to light.  Neck: Normal range of motion. Neck supple. No thyromegaly present.  Cardiovascular: Normal rate, regular rhythm, normal heart sounds and intact distal pulses.   No murmur heard. Pulmonary/Chest: Effort normal. No respiratory distress. She has decreased breath sounds. She has no wheezes.  Abdominal: Soft. Bowel sounds are normal. She exhibits no distension. There is no tenderness.  Musculoskeletal: Normal range of motion. She exhibits no edema or tenderness.  Neurological: She is alert and oriented to person, place, and time. She has normal reflexes. No cranial nerve deficit.  Skin: Skin is warm and dry.  Psychiatric: She has a normal mood and affect. Her behavior is normal. Judgment and thought content normal.  Vitals reviewed.   BP 104/66  Pulse 96   Temp 97.7 F (36.5 C) (Oral)   Ht 5' 4"   (1.626 m)   Wt 112 lb 6.4 oz (51 kg)   BMI 19.29 kg/m        Assessment & Plan:  1. Essential hypertension - CMP14+EGFR  2. Chronic obstructive pulmonary disease, unspecified COPD type ([place]) -Restart Advair - CMP14+EGFR  3. Chronic constipation -Continue medications - CMP14+EGFR  4. Hypothyroidism, unspecified type - CMP14+EGFR - Thyroid Panel With TSH  5. Chronic pain syndrome -Ultram stopped and Norco started today - HYDROcodone-acetaminophen (NORCO/VICODIN) 5-325 MG tablet; Take 1 tablet by mouth every 8 (eight) hours as needed for moderate pain.  Dispense: 90 tablet; Refill: 0 - HYDROcodone-acetaminophen (NORCO) 5-325 MG tablet; Take 1 tablet by mouth every 6 (six) hours as needed for moderate pain.  Dispense: 90 tablet; Refill: 0 - HYDROcodone-acetaminophen (NORCO) 5-325 MG tablet; Take 1 tablet by mouth every 8 (eight) hours as needed for moderate pain.  Dispense: 90 tablet; Refill: 0 - CMP14+EGFR  6. Moderate episode of recurrent major depressive disorder (HCC) - CMP14+EGFR  7. Vitamin D deficiency disease - CMP14+EGFR  8. Hyperlipidemia, unspecified hyperlipidemia type - CMP14+EGFR - Lipid panel  9. GAD (generalized anxiety disorder) - CMP14+EGFR  10. Chronic bilateral thoracic back pain - HYDROcodone-acetaminophen (NORCO/VICODIN) 5-325 MG tablet; Take 1 tablet by mouth every 8 (eight) hours as needed for moderate pain.  Dispense: 90 tablet; Refill: 0 - HYDROcodone-acetaminophen (NORCO) 5-325 MG tablet; Take 1 tablet by mouth every 6 (six) hours as needed for moderate pain.  Dispense: 90 tablet; Refill: 0 - HYDROcodone-acetaminophen (NORCO) 5-325 MG tablet; Take 1 tablet by mouth every 8 (eight) hours as needed for moderate pain.  Dispense: 90 tablet; Refill: 0 - CMP14+EGFR  11. Insomnia, unspecified type - CMP14+EGFR  12. Status post colostomy takedown - HYDROcodone-acetaminophen (NORCO/VICODIN) 5-325 MG tablet; Take 1 tablet by mouth every 8 (eight)  hours as needed for moderate pain.  Dispense: 90 tablet; Refill: 0 - CMP14+EGFR  13. Acute bronchitis, unspecified organism -- Take meds as prescribed - Use a cool mist humidifier  -Use saline nose sprays frequently -Saline irrigations of the nose can be very helpful if done frequently.  * 4X daily for 1 week*  * Use of a nettie pot can be helpful with this. Follow directions with this* -Force fluids -For any cough or congestion  Use plain Mucinex- regular strength or max strength is fine   * Children- consult with Pharmacist for dosing -For fever or aces or pains- take tylenol or ibuprofen appropriate for age and weight.  * for fevers greater than 101 orally you may alternate ibuprofen and tylenol every  3 hours. -Throat lozenges if help - predniSONE (STERAPRED UNI-PAK 21 TAB) 10 MG (21) TBPK tablet; Use as directed  Dispense: 21 tablet; Refill: 0   Continue all meds Labs pending Health Maintenance reviewed Diet and exercise encouraged RTO 3 months   Evelina Dun, FNP

## 2016-06-05 NOTE — Patient Instructions (Signed)

## 2016-06-06 ENCOUNTER — Other Ambulatory Visit: Payer: Self-pay | Admitting: Family

## 2016-06-06 DIAGNOSIS — R7989 Other specified abnormal findings of blood chemistry: Secondary | ICD-10-CM

## 2016-06-06 LAB — CMP14+EGFR
ALT: 11 IU/L (ref 0–32)
AST: 23 IU/L (ref 0–40)
Albumin/Globulin Ratio: 2.1 (ref 1.2–2.2)
Albumin: 5 g/dL — ABNORMAL HIGH (ref 3.6–4.8)
Alkaline Phosphatase: 87 IU/L (ref 39–117)
BUN/Creatinine Ratio: 9 — ABNORMAL LOW (ref 12–28)
BUN: 13 mg/dL (ref 8–27)
Bilirubin Total: 0.3 mg/dL (ref 0.0–1.2)
CO2: 21 mmol/L (ref 18–29)
Calcium: 10 mg/dL (ref 8.7–10.3)
Chloride: 95 mmol/L — ABNORMAL LOW (ref 96–106)
Creatinine, Ser: 1.49 mg/dL — ABNORMAL HIGH (ref 0.57–1.00)
GFR calc Af Amer: 42 mL/min/{1.73_m2} — ABNORMAL LOW (ref >59)
GFR calc non Af Amer: 36 mL/min/{1.73_m2} — ABNORMAL LOW (ref >59)
Globulin, Total: 2.4 g/dL (ref 1.5–4.5)
Glucose: 88 mg/dL (ref 65–99)
Potassium: 3.5 mmol/L (ref 3.5–5.2)
Sodium: 138 mmol/L (ref 134–144)
Total Protein: 7.4 g/dL (ref 6.0–8.5)

## 2016-06-06 LAB — LIPID PANEL
Chol/HDL Ratio: 2.9 ratio units (ref 0.0–4.4)
Cholesterol, Total: 240 mg/dL — ABNORMAL HIGH (ref 100–199)
HDL: 82 mg/dL (ref >39)
LDL Calculated: 134 mg/dL — ABNORMAL HIGH (ref 0–99)
Triglycerides: 121 mg/dL (ref 0–149)
VLDL Cholesterol Cal: 24 mg/dL (ref 5–40)

## 2016-06-06 LAB — THYROID PANEL WITH TSH
Free Thyroxine Index: 2.2 (ref 1.2–4.9)
T3 Uptake Ratio: 26 % (ref 24–39)
T4, Total: 8.5 ug/dL (ref 4.5–12.0)
TSH: 2.91 u[IU]/mL (ref 0.450–4.500)

## 2016-06-08 ENCOUNTER — Telehealth: Payer: Self-pay | Admitting: Family

## 2016-06-09 MED ORDER — CIPROFLOXACIN HCL 250 MG PO TABS: 250.0000 mg | ORAL_TABLET | Freq: Two times a day (BID) | ORAL | 0 refills | Status: DC

## 2016-06-09 NOTE — Telephone Encounter (Signed)
Prescription sent to pharmacy.

## 2016-06-13 ENCOUNTER — Ambulatory Visit (INDEPENDENT_AMBULATORY_CARE_PROVIDER_SITE_OTHER): Payer: Medicare Other | Admitting: Family

## 2016-06-13 ENCOUNTER — Encounter: Payer: Self-pay | Admitting: Family

## 2016-06-13 VITALS — BP 118/69 | HR 82 | Temp 97.8°F | Ht 64.0 in | Wt 118.0 lb

## 2016-06-13 DIAGNOSIS — N3001 Acute cystitis with hematuria: Secondary | ICD-10-CM | POA: Diagnosis not present

## 2016-06-13 DIAGNOSIS — R7989 Other specified abnormal findings of blood chemistry: Secondary | ICD-10-CM

## 2016-06-13 DIAGNOSIS — R300 Dysuria: Secondary | ICD-10-CM

## 2016-06-13 LAB — MICROSCOPIC EXAMINATION: WBC, UA: >30 /HPF — AB

## 2016-06-13 LAB — URINALYSIS, COMPLETE
Bilirubin, UA: NEGATIVE
Glucose, UA: NEGATIVE
Ketones, UA: NEGATIVE
Nitrite, UA: NEGATIVE
Protein, UA: NEGATIVE
Specific Gravity, UA: 1.015 (ref 1.005–1.030)
Urobilinogen, Ur: 0.2 mg/dL (ref 0.2–1.0)
pH, UA: 8.5 — ABNORMAL HIGH (ref 5.0–7.5)

## 2016-06-13 MED ORDER — SULFAMETHOXAZOLE-TRIMETHOPRIM 800-160 MG PO TABS: 1.0000 | ORAL_TABLET | Freq: Two times a day (BID) | ORAL | 0 refills | Status: DC

## 2016-06-13 MED ORDER — CIPROFLOXACIN HCL 250 MG PO TABS: 250.0000 mg | ORAL_TABLET | Freq: Two times a day (BID) | ORAL | 0 refills | Status: DC

## 2016-06-13 NOTE — Patient Instructions (Signed)
Urinary Frequency The number of times a normal person urinates depends upon how much liquid they take in and how much liquid they are losing. If the temperature is hot and there is high humidity, then the person will sweat more and usually breathe a little more frequently. These factors decrease the amount of frequency of urination that would be considered normal. The amount you drink is easily determined, but the amount of fluid lost is sometimes more difficult to calculate.  Fluid is lost in two ways:  Sensible fluid loss is usually measured by the amount of urine that you get rid of. Losses of fluid can also occur with diarrhea.  Insensible fluid loss is more difficult to measure. It is caused by evaporation. Insensible loss of fluid occurs through breathing and sweating. It usually ranges from a little less than a quart to a little more than a quart of fluid a day. In normal temperatures and activity levels, the average person may urinate 4 to 7 times in a 24-hour period. Needing to urinate more often than that could indicate a problem. If one urinates 4 to 7 times in 24 hours and has large volumes each time, that could indicate a different problem from one who urinates 4 to 7 times a day and has small volumes. The time of urinating is also important. Most urinating should be done during the waking hours. Getting up at night to urinate frequently can indicate some problems. CAUSES  The bladder is the organ in your lower abdomen that holds urine. Like a balloon, it swells some as it fills up. Your nerves sense this and tell you it is time to head for the bathroom. There are a number of reasons that you might feel the need to urinate more often than usual. They include:  Urinary tract infection. This is usually associated with other signs such as burning when you urinate.  In men, problems with the prostate (a walnut-size gland that is located near the tube that carries urine out of your body). There  are two reasons why the prostate can cause an increased frequency of urination:  An enlarged prostate that does not let the bladder empty well. If the bladder only half empties when you urinate, then it only has half the capacity to fill before you have to urinate again.  The nerves in the bladder become more hypersensitive with an increased size of the prostate even if the bladder empties completely.  Pregnancy.  Obesity. Excess weight is more likely to cause a problem for women than for men.  Bladder stones or other bladder problems.  Caffeine.  Alcohol.  Medications. For example, drugs that help the body get rid of extra fluid (diuretics) increase urine production. Some other medicines must be taken with lots of fluids.  Muscle or nerve weakness. This might be the result of a spinal cord injury, a stroke, multiple sclerosis, or Parkinson disease.  Long-standing diabetes can decrease the sensation of the bladder. This loss of sensation makes it harder to sense the bladder needs to be emptied. Over a period of years, the bladder is stretched out by constant overfilling. This weakens the bladder muscles so that the bladder does not empty well and has less capacity to fill with new urine.  Interstitial cystitis (also called painful bladder syndrome). This condition develops because the tissues that line the inside of the bladder are inflamed (inflammation is the body's way of reacting to injury or infection). It causes pain and frequent   urination. It occurs in women more often than in men. DIAGNOSIS   To decide what might be causing your urinary frequency, your health care provider will probably:  Ask about symptoms you have noticed.  Ask about your overall health. This will include questions about any medications you are taking.  Do a physical examination.  Order some tests. These might include:  A blood test to check for diabetes or other health issues that could be contributing  to the problem.  Urine testing. This could measure the flow of urine and the pressure on the bladder.  A test of your neurological system (the brain, spinal cord, and nerves). This is the system that senses the need to urinate.  A bladder test to check whether it is emptying completely when you urinate.  Cystoscopy. This test uses a thin tube with a tiny camera on it. It offers a look inside your urethra and bladder to see if there are problems.  Imaging tests. You might be given a contrast dye and then asked to urinate. X-rays are taken to see how your bladder is working. TREATMENT  It is important for you to be evaluated to determine if the amount or frequency that you have is unusual or abnormal. If it is found to be abnormal, the cause should be determined and this can usually be found out easily. Depending upon the cause, treatment could include medication, stimulation of the nerves, or surgery. There are not too many things that you can do as an individual to change your urinary frequency. It is important that you balance the amount of fluid intake needed to compensate for your activity and the temperature. Medical problems will be diagnosed and taken care of by your physician. There is no particular bladder training such as Kegel exercises that you can do to help urinary frequency. This is an exercise that is usually recommended for people who have leaking of urine when they laugh, cough, or sneeze. HOME CARE INSTRUCTIONS   Take any medications your health care provider prescribed or suggested. Follow the directions carefully.  Practice any lifestyle changes that are recommended. These might include:  Drinking less fluid or drinking at different times of the day. If you need to urinate often during the night, for example, you may need to stop drinking fluids early in the evening.  Cutting down on caffeine or alcohol. They both can make you need to urinate more often than normal. Caffeine  is found in coffee, tea, and sodas.  Losing weight, if that is recommended.  Keep a journal or a log. You might be asked to record how much you drink and when and where you feel the need to urinate. This will also help evaluate how well the treatment provided by your physician is working. SEEK MEDICAL CARE IF:   Your need to urinate often gets worse.  You feel increased pain or irritation when you urinate.  You notice blood in your urine.  You have questions about any medications that your health care provider recommended.  You notice blood, pus, or swelling at the site of any test or treatment procedure.  You develop a fever of more than 100.5F (38.1C). SEEK IMMEDIATE MEDICAL CARE IF:  You develop a fever of more than 102.0F (38.9C). This information is not intended to replace advice given to you by your health care provider. Make sure you discuss any questions you have with your health care provider. Document Released: 04/15/2009 Document Revised: 07/10/2014 Document Reviewed: 01/13/2015   Elsevier Interactive Patient Education  2017 [employer]  

## 2016-06-13 NOTE — Progress Notes (Signed)
   Subjective:    Patient ID: Stacy Dalton, female    DOB: [birth year], 65 y.o.   MRN: [id number]  Dysuria   This is a new problem. The current episode started in the past 7 days. The problem occurs every urination. The problem has been gradually worsening. The quality of the pain is described as burning. The pain is at a severity of 5/10. The pain is moderate. Associated symptoms include frequency, hesitancy and urgency. Pertinent negatives include no discharge or vomiting. She has tried increased fluids for the symptoms. The treatment provided mild relief. Her past medical history is significant for recurrent UTIs.   *Pt never picked up antibiotic sent in on 06/08/16   Review of Systems  Gastrointestinal: Negative for vomiting.  Genitourinary: Positive for dysuria, frequency, hesitancy and urgency.  All other systems reviewed and are negative.      Objective:   Physical Exam  Constitutional: She is oriented to person, place, and time. She appears well-developed and well-nourished. No distress.  HENT:  Head: Normocephalic.  Eyes: Pupils are equal, round, and reactive to light.  Neck: Normal range of motion. Neck supple. No thyromegaly present.  Cardiovascular: Normal rate, regular rhythm, normal heart sounds and intact distal pulses.   No murmur heard. Pulmonary/Chest: Effort normal and breath sounds normal. No respiratory distress. She has no wheezes.  Abdominal: Soft. Bowel sounds are normal. She exhibits no distension. There is tenderness (mild lower abd).  Musculoskeletal: Normal range of motion. She exhibits no edema or tenderness.  Neurological: She is alert and oriented to person, place, and time.  Skin: Skin is warm and dry.  Psychiatric: She has a normal mood and affect. Her behavior is normal. Judgment and thought content normal.  Vitals reviewed.     BP 118/69   Pulse 82   Temp 97.8 F (36.6 C) (Oral)   Ht 5\' 4"  (1.626 m)   Wt 118 lb (53.5 kg)   BMI 20.25  kg/m      Assessment & Plan:  1. Dysuria - Urinalysis, Complete - Urinalysis, Complete  2. Acute cystitis with hematuria Force fluids AZO over the counter X2 days RTO prn Culture pending - sulfamethoxazole-trimethoprim (BACTRIM DS) 800-160 MG tablet; Take 1 tablet by mouth 2 (two) times daily.  Dispense: 14 tablet; Refill: 0 - Urine culture  Evelina Dun, FNP

## 2016-06-13 NOTE — Addendum Note (Signed)
Addended by: Evelina Dun A on: 06/13/2016 03:23 PM   Modules accepted: Orders

## 2016-06-13 NOTE — Addendum Note (Signed)
Addended by: Earlene Plater on: 06/13/2016 03:25 PM   Modules accepted: Orders

## 2016-06-14 LAB — BMP8+EGFR
BUN/Creatinine Ratio: 17 (ref 12–28)
BUN: 17 mg/dL (ref 8–27)
CO2: 28 mmol/L (ref 18–29)
Calcium: 10.1 mg/dL (ref 8.7–10.3)
Chloride: 91 mmol/L — ABNORMAL LOW (ref 96–106)
Creatinine, Ser: 1.01 mg/dL — ABNORMAL HIGH (ref 0.57–1.00)
GFR calc Af Amer: 67 mL/min/{1.73_m2} (ref >59)
GFR calc non Af Amer: 58 mL/min/{1.73_m2} — ABNORMAL LOW (ref >59)
Glucose: 93 mg/dL (ref 65–99)
Potassium: 4.3 mmol/L (ref 3.5–5.2)
Sodium: 136 mmol/L (ref 134–144)

## 2016-06-15 ENCOUNTER — Ambulatory Visit: Payer: Medicare Other | Admitting: Pharmacist

## 2016-06-15 ENCOUNTER — Other Ambulatory Visit: Payer: Self-pay | Admitting: Family

## 2016-06-16 ENCOUNTER — Ambulatory Visit: Payer: Self-pay | Admitting: Pharmacist

## 2016-06-16 LAB — URINE CULTURE

## 2016-06-17 ENCOUNTER — Other Ambulatory Visit: Payer: Self-pay | Admitting: Family Medicine

## 2016-06-19 ENCOUNTER — Other Ambulatory Visit: Payer: Self-pay | Admitting: Family

## 2016-06-19 MED ORDER — NITROFURANTOIN MONOHYD MACRO 100 MG PO CAPS: 100.0000 mg | ORAL_CAPSULE | Freq: Two times a day (BID) | ORAL | 0 refills | Status: DC

## 2016-06-20 ENCOUNTER — Telehealth: Payer: Self-pay | Admitting: Family

## 2016-06-20 NOTE — Telephone Encounter (Signed)
Patient aware of all lab work and instruction

## 2016-06-30 ENCOUNTER — Telehealth: Payer: Self-pay | Admitting: Family

## 2016-06-30 MED ORDER — BENZONATATE 200 MG PO CAPS: 200.0000 mg | ORAL_CAPSULE | Freq: Three times a day (TID) | ORAL | 1 refills | Status: DC | PRN

## 2016-06-30 NOTE — Telephone Encounter (Signed)
Tessalon Prescription sent to pharmacy   

## 2016-07-25 ENCOUNTER — Telehealth: Payer: Self-pay | Admitting: Family

## 2016-07-25 DIAGNOSIS — G4700 Insomnia, unspecified: Secondary | ICD-10-CM

## 2016-07-26 ENCOUNTER — Other Ambulatory Visit: Payer: Self-pay | Admitting: Family

## 2016-07-26 DIAGNOSIS — G4700 Insomnia, unspecified: Secondary | ICD-10-CM

## 2016-07-26 MED ORDER — ZOLPIDEM TARTRATE ER 12.5 MG PO TBCR: 12.5000 mg | EXTENDED_RELEASE_TABLET | Freq: Every evening | ORAL | 5 refills | Status: DC | PRN

## 2016-07-26 NOTE — Telephone Encounter (Signed)
Patient requesting refill on her Ambien. Last seen 06/13/16. Please advise.

## 2016-07-26 NOTE — Telephone Encounter (Signed)
Rx called in. Patient aware.  

## 2016-07-27 ENCOUNTER — Encounter: Payer: Self-pay | Admitting: Family

## 2016-07-27 ENCOUNTER — Ambulatory Visit (INDEPENDENT_AMBULATORY_CARE_PROVIDER_SITE_OTHER): Payer: Medicare Other | Admitting: Family

## 2016-07-27 VITALS — BP 116/70 | HR 102 | Temp 98.4°F | Ht 64.0 in | Wt 118.0 lb

## 2016-07-27 DIAGNOSIS — M546 Pain in thoracic spine: Secondary | ICD-10-CM | POA: Diagnosis not present

## 2016-07-27 DIAGNOSIS — G8929 Other chronic pain: Secondary | ICD-10-CM

## 2016-07-27 DIAGNOSIS — G894 Chronic pain syndrome: Secondary | ICD-10-CM | POA: Diagnosis not present

## 2016-07-27 MED ORDER — HYDROCODONE-ACETAMINOPHEN 7.5-325 MG PO TABS: 1.0000 | ORAL_TABLET | Freq: Three times a day (TID) | ORAL | 0 refills | Status: DC | PRN

## 2016-07-27 NOTE — Progress Notes (Signed)
   Subjective:    Patient ID: Stacy Dalton, female    DOB: Jun 08, 1951, 66 y.o.   MRN: [id number]  PT presents to the office today with chronic back pain. Pt states she has rx of Ultram, but does not feel like this is helping at this time.  Back Pain  This is a chronic problem. The current episode started more than 1 year ago. The problem occurs constantly. The problem has been gradually worsening since onset. The pain is present in the thoracic spine. The quality of the pain is described as aching. The pain does not radiate. The pain is at a severity of 8/10. The pain is moderate. The symptoms are aggravated by standing. Pertinent negatives include no bladder incontinence, bowel incontinence, dysuria or weakness. Risk factors include sedentary lifestyle. She has tried heat, bed rest and ice (ultram) for the symptoms. The treatment provided mild relief.      Review of Systems  Gastrointestinal: Negative for bowel incontinence.  Genitourinary: Negative for bladder incontinence and dysuria.  Musculoskeletal: Positive for back pain.  Neurological: Negative for weakness.  All other systems reviewed and are negative.      Objective:   Physical Exam  Constitutional: She is oriented to person, place, and time. She appears well-developed and well-nourished.  Cardiovascular: Normal rate, regular rhythm, normal heart sounds and intact distal pulses.   Pulmonary/Chest: Effort normal and breath sounds normal.  Musculoskeletal: Normal range of motion. She exhibits tenderness.  Constant Bilateral thoracic tenderness, pain with extension and twisting  Neurological: She is alert and oriented to person, place, and time.  Skin: Skin is warm and dry.  Psychiatric: She has a normal mood and affect. Her behavior is normal. Judgment and thought content normal.      BP 116/70   Pulse (!) 102   Temp 98.4 F (36.9 C) (Oral)   Ht 5\' 4"  (1.626 m)   Wt 118 lb (53.5 kg)   BMI 20.25 kg/m        Assessment & Plan:  1. Chronic bilateral thoracic back pain - Ambulatory referral to Physical Therapy - HYDROcodone-acetaminophen (NORCO) 7.5-325 MG tablet; Take 1 tablet by mouth every 8 (eight) hours as needed for moderate pain.  Dispense: 90 tablet; Refill: 0 - HYDROcodone-acetaminophen (NORCO) 7.5-325 MG tablet; Take 1 tablet by mouth every 8 (eight) hours as needed for moderate pain.  Dispense: 90 tablet; Refill: 0  2. Chronic pain syndrome - Ambulatory referral to Physical Therapy - HYDROcodone-acetaminophen (NORCO) 7.5-325 MG tablet; Take 1 tablet by mouth every 8 (eight) hours as needed for moderate pain.  Dispense: 90 tablet; Refill: 0 - HYDROcodone-acetaminophen (NORCO) 7.5-325 MG tablet; Take 1 tablet by mouth every 8 (eight) hours as needed for moderate pain.  Dispense: 90 tablet; Refill: 0  Rest Ice and heat as needed Back ROM exercises discussed- Handout given RTO in 2 months, will do pain contract at that time  Evelina Dun, FNP

## 2016-07-27 NOTE — Patient Instructions (Signed)
Back Exercises Introduction If you have pain in your back, do these exercises 2-3 times each day or as told by your doctor. When the pain goes away, do the exercises once each day, but repeat the steps more times for each exercise (do more repetitions). If you do not have pain in your back, do these exercises once each day or as told by your doctor. Exercises Single Knee to Chest  Do these steps 3-5 times in a row for each leg: 1. Lie on your back on a firm bed or the floor with your legs stretched out. 2. Bring one knee to your chest. 3. Hold your knee to your chest by grabbing your knee or thigh. 4. Pull on your knee until you feel a gentle stretch in your lower back. 5. Keep doing the stretch for 10-30 seconds. 6. Slowly let go of your leg and straighten it. Pelvic Tilt  Do these steps 5-10 times in a row: 1. Lie on your back on a firm bed or the floor with your legs stretched out. 2. Bend your knees so they point up to the ceiling. Your feet should be flat on the floor. 3. Tighten your lower belly (abdomen) muscles to press your lower back against the floor. This will make your tailbone point up to the ceiling instead of pointing down to your feet or the floor. 4. Stay in this position for 5-10 seconds while you gently tighten your muscles and breathe evenly. Cat-Cow  Do these steps until your lower back bends more easily: 1. Get on your hands and knees on a firm surface. Keep your hands under your shoulders, and keep your knees under your hips. You may put padding under your knees. 2. Let your head hang down, and make your tailbone point down to the floor so your lower back is round like the back of a cat. 3. Stay in this position for 5 seconds. 4. Slowly lift your head and make your tailbone point up to the ceiling so your back hangs low (sags) like the back of a cow. 5. Stay in this position for 5 seconds. Press-Ups  Do these steps 5-10 times in a row: 1. Lie on your belly  (face-down) on the floor. 2. Place your hands near your head, about shoulder-width apart. 3. While you keep your back relaxed and keep your hips on the floor, slowly straighten your arms to raise the top half of your body and lift your shoulders. Do not use your back muscles. To make yourself more comfortable, you may change where you place your hands. 4. Stay in this position for 5 seconds. 5. Slowly return to lying flat on the floor. Bridges  Do these steps 10 times in a row: 1. Lie on your back on a firm surface. 2. Bend your knees so they point up to the ceiling. Your feet should be flat on the floor. 3. Tighten your butt muscles and lift your butt off of the floor until your waist is almost as high as your knees. If you do not feel the muscles working in your butt and the back of your thighs, slide your feet 1-2 inches farther away from your butt. 4. Stay in this position for 3-5 seconds. 5. Slowly lower your butt to the floor, and let your butt muscles relax. If this exercise is too easy, try doing it with your arms crossed over your chest. Belly Crunches  Do these steps 5-10 times in a row: 1. Lie   on your back on a firm bed or the floor with your legs stretched out. 2. Bend your knees so they point up to the ceiling. Your feet should be flat on the floor. 3. Cross your arms over your chest. 4. Tip your chin a little bit toward your chest but do not bend your neck. 5. Tighten your belly muscles and slowly raise your chest just enough to lift your shoulder blades a tiny bit off of the floor. 6. Slowly lower your chest and your head to the floor. Back Lifts  Do these steps 5-10 times in a row: 1. Lie on your belly (face-down) with your arms at your sides, and rest your forehead on the floor. 2. Tighten the muscles in your legs and your butt. 3. Slowly lift your chest off of the floor while you keep your hips on the floor. Keep the back of your head in line with the curve in your back.  Look at the floor while you do this. 4. Stay in this position for 3-5 seconds. 5. Slowly lower your chest and your face to the floor. Contact a doctor if:  Your back pain gets a lot worse when you do an exercise.  Your back pain does not lessen 2 hours after you exercise. If you have any of these problems, stop doing the exercises. Do not do them again unless your doctor says it is okay. Get help right away if:  You have sudden, very bad back pain. If this happens, stop doing the exercises. Do not do them again unless your doctor says it is okay. This information is not intended to replace advice given to you by your health care provider. Make sure you discuss any questions you have with your health care provider. Document Released: 07/22/2010 Document Revised: 11/25/2015 Document Reviewed: 08/13/2014  2017 Elsevier  

## 2016-07-30 ENCOUNTER — Other Ambulatory Visit: Payer: Self-pay | Admitting: Family

## 2016-08-07 ENCOUNTER — Ambulatory Visit: Payer: Medicare Other | Admitting: Family

## 2016-08-07 ENCOUNTER — Ambulatory Visit: Payer: Medicare Other | Admitting: Physical Therapy

## 2016-08-10 ENCOUNTER — Encounter: Payer: Self-pay | Admitting: Family Medicine

## 2016-08-10 ENCOUNTER — Ambulatory Visit: Payer: Medicare Other | Admitting: Physical Therapy

## 2016-08-10 ENCOUNTER — Ambulatory Visit (INDEPENDENT_AMBULATORY_CARE_PROVIDER_SITE_OTHER): Payer: Medicare Other | Admitting: Family Medicine

## 2016-08-10 VITALS — BP 130/78 | HR 84 | Temp 100.3°F | Ht 64.0 in | Wt 119.4 lb

## 2016-08-10 DIAGNOSIS — J449 Chronic obstructive pulmonary disease, unspecified: Secondary | ICD-10-CM | POA: Diagnosis not present

## 2016-08-10 DIAGNOSIS — J111 Influenza due to unidentified influenza virus with other respiratory manifestations: Secondary | ICD-10-CM

## 2016-08-10 MED ORDER — AZITHROMYCIN 250 MG PO TABS: ORAL_TABLET | ORAL | 0 refills | Status: DC

## 2016-08-10 MED ORDER — OSELTAMIVIR PHOSPHATE 75 MG PO CAPS: 75.0000 mg | ORAL_CAPSULE | Freq: Two times a day (BID) | ORAL | 0 refills | Status: DC

## 2016-08-10 MED ORDER — PREDNISONE 20 MG PO TABS: 40.0000 mg | ORAL_TABLET | Freq: Every day | ORAL | 0 refills | Status: DC

## 2016-08-10 MED ORDER — OSELTAMIVIR PHOSPHATE 30 MG PO CAPS: 30.0000 mg | ORAL_CAPSULE | Freq: Two times a day (BID) | ORAL | 0 refills | Status: DC

## 2016-08-10 NOTE — Progress Notes (Signed)
   HPI  Patient presents today with cough and cold.  Patient explained she's had less than 12 hours of cough, headache, sore throat, body aches, and fever measured at 100.0 this morning.  She explained she's had sick contacts with her 68-year-old grandson who had strep pharyngitis.  He is tolerating food and fluids normally.  She has throat fullness and very sore throat.  PMH: Smoking status noted ROS: Per HPI  Objective: BP 130/78   Pulse 84   Temp 100.3 F (37.9 C) (Oral)   Ht 5\' 4"  (1.626 m)   Wt 119 lb 6.4 oz (54.2 kg)   BMI 20.49 kg/m  Gen: NAD, alert, cooperative with exam HEENT: NCAT, oropharynx with mild erythema with no exudates or swollen tonsils, TMs normal bilaterally CV: RRR, good S1/S2, no murmur Resp: CTABL, no wheezes, non-labored Neuro: Alert and oriented, No gross deficits   Assessment and plan:  # Influenza Clinical diagnosis, considering COPD and exposure to strep pharyngitis I have added azithromycin to her regimen as well as prednisone. Patient is in the first 12 hours of symptoms I emphasized starting Tamiflu very soon.    Meds ordered this encounter  Medications  . azithromycin (ZITHROMAX) 250 MG tablet    Sig: Take 2 tablets on day 1 and 1 tablet daily after that    Dispense:  6 tablet    Refill:  0  . DISCONTD: oseltamivir (TAMIFLU) 75 MG capsule    Sig: Take 1 capsule (75 mg total) by mouth 2 (two) times daily.    Dispense:  10 capsule    Refill:  0  . predniSONE (DELTASONE) 20 MG tablet    Sig: Take 2 tablets (40 mg total) by mouth daily with breakfast. 2 po at same time daily for 5 days    Dispense:  10 tablet    Refill:  0  . oseltamivir (TAMIFLU) 30 MG capsule    Sig: Take 1 capsule (30 mg total) by mouth 2 (two) times daily.    Dispense:  10 capsule    Refill:  0    Renal dosing, CrCl 46, PLease dis-regard 75 mg Rx   Discussion with pharmacists: - 30 mg on back order, Stay with 75 mg BID   Laroy Apple, MD [facility] 08/10/2016, 3:59 PM

## 2016-08-10 NOTE — Patient Instructions (Addendum)
Great to see you!  Please be sure to finish all antibiotics and tamiflu  Come back with any concerns   Influenza, Adult Influenza, more commonly known as "the flu," is a viral infection that primarily affects the respiratory tract. The respiratory tract includes organs that help you breathe, such as the lungs, nose, and throat. The flu causes many common cold symptoms, as well as a high fever and body aches. The flu spreads easily from person to person (is contagious). Getting a flu shot (influenza vaccination) every year is the best way to prevent influenza. What are the causes? Influenza is caused by a virus. You can catch the virus by:  Breathing in droplets from an infected person's cough or sneeze.  Touching something that was recently contaminated with the virus and then touching your mouth, nose, or eyes. What increases the risk? The following factors may make you more likely to get the flu:  Not cleaning your hands frequently with soap and water or alcohol-based hand sanitizer.  Having close contact with many people during cold and flu season.  Touching your mouth, eyes, or nose without washing or sanitizing your hands first.  Not drinking enough fluids or not eating a healthy diet.  Not getting enough sleep or exercise.  Being under a high amount of stress.  Not getting a yearly (annual) flu shot. You may be at a higher risk of complications from the flu, such as a severe lung infection (pneumonia), if you:  Are over the age of 64.  Are pregnant.  Have a weakened disease-fighting system (immune system). You may have a weakened immune system if you:  Have HIV or AIDS.  Are undergoing chemotherapy.  Aretaking medicines that reduce the activity of (suppress) the immune system.  Have a long-term (chronic) illness, such as heart disease, kidney disease, diabetes, or lung disease.  Have a liver disorder.  Are obese.  Have anemia. What are the signs or  symptoms? Symptoms of this condition typically last 4-10 days and may include:  Fever.  Chills.  Headache, body aches, or muscle aches.  Sore throat.  Cough.  Runny or congested nose.  Chest discomfort and cough.  Poor appetite.  Weakness or tiredness (fatigue).  Dizziness.  Nausea or vomiting. How is this diagnosed? This condition may be diagnosed based on your medical history and a physical exam. Your health care provider may do a nose or throat swab test to confirm the diagnosis. How is this treated? If influenza is detected early, you can be treated with antiviral medicine that can reduce the length of your illness and the severity of your symptoms. This medicine may be given by mouth (orally) or through an IV tube that is inserted in one of your veins. The goal of treatment is to relieve symptoms by taking care of yourself at home. This may include taking over-the-counter medicines, drinking plenty of fluids, and adding humidity to the air in your home. In some cases, influenza goes away on its own. Severe influenza or complications from influenza may be treated in a hospital. Follow these instructions at home:  Take over-the-counter and prescription medicines only as told by your health care provider.  Use a cool mist humidifier to add humidity to the air in your home. This can make breathing easier.  Rest as needed.  Drink enough fluid to keep your urine clear or pale yellow.  Cover your mouth and nose when you cough or sneeze.  Wash your hands with soap and  water often, especially after you cough or sneeze. If soap and water are not available, use hand sanitizer.  Stay home from work or school as told by your health care provider. Unless you are visiting your health care provider, try to avoid leaving home until your fever has been gone for 24 hours without the use of medicine.  Keep all follow-up visits as told by your health care provider. This is  important. How is this prevented?  Getting an annual flu shot is the best way to avoid getting the flu. You may get the flu shot in late summer, fall, or winter. Ask your health care provider when you should get your flu shot.  Wash your hands often or use hand sanitizer often.  Avoid contact with people who are sick during cold and flu season.  Eat a healthy diet, drink plenty of fluids, get enough sleep, and exercise regularly. Contact a health care provider if:  You develop new symptoms.  You have:  Chest pain.  Diarrhea.  A fever.  Your cough gets worse.  You produce more mucus.  You feel nauseous or you vomit. Get help right away if:  You develop shortness of breath or difficulty breathing.  Your skin or nails turn a bluish color.  You have severe pain or stiffness in your neck.  You develop a sudden headache or sudden pain in your face or ear.  You cannot stop vomiting. This information is not intended to replace advice given to you by your health care provider. Make sure you discuss any questions you have with your health care provider. Document Released: 06/16/2000 Document Revised: 11/25/2015 Document Reviewed: 04/13/2015 Elsevier Interactive Patient Education  2017 [employer].

## 2016-08-17 ENCOUNTER — Ambulatory Visit: Payer: Medicare Other | Attending: Family | Admitting: Physical Therapy

## 2016-08-17 DIAGNOSIS — M546 Pain in thoracic spine: Secondary | ICD-10-CM | POA: Diagnosis not present

## 2016-08-17 NOTE — Therapy (Signed)
[facility] [place], [place], [address] Phone: [phone]   Fax:  [phone]  Physical Therapy Evaluation  Patient Details  Name: Stacy Dalton MRN: [id number] Date of Birth: 01/15/1951 Referring Provider: Evelina Dun.  Encounter Date: 08/17/2016      PT End of Session - 08/17/16 1905    Visit Number 1   Number of Visits 16   Date for PT Re-Evaluation 10/16/16   PT Start Time 0230   PT Stop Time 0317   PT Time Calculation (min) 47 min   Activity Tolerance Patient tolerated treatment well   Behavior During Therapy [facility] for tasks assessed/performed      Past Medical History:  Diagnosis Date  . Anxiety    panic attacks  . COPD (chronic obstructive pulmonary disease) ([place])    told has copd, no current inhaler use  . Depression   . GERD (gastroesophageal reflux disease)   . Hypertension   . Hypothyroidism   . Insomnia   . Migraine   . Migraine   . Osteopenia     Past Surgical History:  Procedure Laterality Date  . ABDOMINAL HYSTERECTOMY    . bladder tac    . COLON SURGERY    . COLOSTOMY CLOSURE  04/19/2012   Procedure: COLOSTOMY CLOSURE;  Surgeon: Adin Hector, MD;  Location: [facility];  Service: General;  Laterality: N/A;  Laparotomy, Resection and Closure of Colostomy  . COLOSTOMY TAKEDOWN N/A 04/12/2016   Procedure: [place] TAKEDOWN;  Surgeon: Johnathan Hausen, MD;  Location: [facility];  Service: General;  Laterality: N/A;  . LAPAROTOMY  10/04/2011, colostomy also   Procedure: EXPLORATORY LAPAROTOMY;  Surgeon: Adin Hector, MD;  Location: [facility];  Service: General;  Laterality: N/A;  left partial colectomy with colostomy  . LAPAROTOMY  04/19/2012   Procedure: EXPLORATORY LAPAROTOMY;  Surgeon: Adin Hector, MD;  Location: [facility];  Service: General;  Laterality: N/A;  . LAPAROTOMY N/A 11/29/2015   Procedure: EXPLORATORY LAPAROTOMY; SUBTOTAL COLECTOMY WITH HARTMAN PROCEDURE AND END COLOSTOMY;   Surgeon: Johnathan Hausen, MD;  Location: [facility];  Service: General;  Laterality: N/A;  . TUBAL LIGATION    . VENTRAL HERNIA REPAIR  04/19/2012   Procedure: HERNIA REPAIR VENTRAL ADULT;  Surgeon: Adin Hector, MD;  Location: [facility];  Service: General;  Laterality: N/A;    There were no vitals filed for this visit.       Subjective Assessment - 08/17/16 1858    Subjective The patient presents to [facility] with c/o bilateral midback pain.  She states this has been going on for many years but worsened after an abdominal surgery in October of 2017.  She had to be very sedentary and beleives this contributed to her increasing pain-levels.  She states pain medication and heat decrease her pain.  Prolonged siting and standing increase her pain.  She has a low resting pain-level of a 2/10 today but her pain can rise to a 9/10 with the aforementioned activites.   Pertinent History Abdominal surgeries.   Limitations Sitting;Standing   How long can you sit comfortably? 15-20 minutes.   How long can you stand comfortably? 10-15 minutes.   Patient Stated Goals Get out of pain.   Currently in Pain? Yes   Pain Score 2    Pain Location Back   Pain Orientation Right;Left   Pain Descriptors / Indicators Aching   Pain Type Chronic pain   Pain Onset More than a month ago  Pain Frequency Constant   Aggravating Factors  See above.   Pain Relieving Factors See above.   Effect of Pain on Daily Activities Impaired ADL performance due to rising pain-levels.            [facility] PT Assessment - 08/17/16 0001      Assessment   Medical Diagnosis Chronic bilateral thoracic pain.   Referring Provider Evelina Dun.   Onset Date/Surgical Date --  Ongoing.     Precautions   Precautions None     Restrictions   Weight Bearing Restrictions No     Balance Screen   Has the patient fallen in the past 6 months No   Has the patient had a decrease in activity level because of a fear of falling?  No   Is the  patient reluctant to leave their home because of a fear of falling?  No     Home Environment   Living Environment Private residence     Prior Function   Level of Independence Independent     Posture/Postural Control   Posture Comments Minimal forward head and rounded posture.  Patient states she tries to maintain a good posture as it helps decrease her pain.     ROM / Strength   AROM / PROM / Strength AROM;Strength     AROM   Overall AROM Comments Normal bilateral UE AROM.  Normal spinal flexion and extension to 15 degrees.     Strength   Overall Strength Comments Middle and lower trap strength= 4+/5.     Palpation   Palpation comment Patient reports palpable tenderness from T2 to T12/L1 bilaterally adjacent to her spinous processes over her paraspinal musculature.     Special Tests    Special Tests --  Absent bilateral UE DTR's.     Transfers   Comments Slow and purposeful.     Ambulation/Gait   Gait Comments WNL.                   [facility] Adult PT Treatment/Exercise - 08/17/16 0001      Modalities   Modalities Electrical Stimulation;Moist Heat                  PT Short Term Goals - 08/17/16 1923      PT SHORT TERM GOAL #1   Title STG's=LTG's.           PT Long Term Goals - 08/17/16 1923      PT LONG TERM GOAL #1   Title Independent with a HEP.   Time 8   Period Weeks   Status New     PT LONG TERM GOAL #2   Title Sit 30 minutes with pain not > 3/10.   Time 8   Period Weeks   Status New     PT LONG TERM GOAL #3   Title Stand 20 minutes with pain not > 3/10.   Time 8   Period Weeks   Status New     PT LONG TERM GOAL #4   Title Perform ADL's with pain not > 3/10.   Time 8   Period Weeks   Status New               Plan - 08/17/16 1917    Clinical Impression Statement The patient presents with chronic bilateral and diffuse thoracic pain.  Her pain pain increases with prolonged sitting and standing.  She is expected  to benefit from skilled physical therapy to  include drying, STW/M; modalites and scapular strengthening exercises.   Rehab Potential Excellent   PT Frequency 2x / week   PT Duration 8 weeks   PT Treatment/Interventions ADLs/Self Care Home Management;Electrical Stimulation;Ultrasound;Moist Heat;Therapeutic activities;Therapeutic exercise;Patient/family education;Manual techniques;Dry needling   PT Next Visit Plan Modalities and STW/M to thoracic paraspinal musculature; Dry Needling; scapular strengthening.      Patient will benefit from skilled therapeutic intervention in order to improve the following deficits and impairments:  Pain, Decreased activity tolerance  Visit Diagnosis: Pain in thoracic spine - Plan: PT plan of care cert/re-cert      G-Codes - [id number]    Functional Assessment Tool Used FOTO.[name]% limitation.   Functional Limitation Mobility: Walking and moving around   Mobility: Walking and Moving Around Current Status [phone]) At least 40 percent but less than 60 percent impaired, limited or restricted   Mobility: Walking and Moving Around Goal Status [phone]) At least 20 percent but less than 40 percent impaired, limited or restricted       Problem List Patient Active Problem List   Diagnosis Date Noted  . Status post colostomy takedown 04/12/2016  . Chronic constipation 12/03/2015  . Peritonitis with abscess of intestine ([place]) 11/29/2015  . COPD (chronic obstructive pulmonary disease) ([place]) 09/20/2015  . Osteopenia 06/10/2014  . Vitamin D deficiency disease 06/10/2014  . Hypothyroidism   . Depression 11/18/2012  . Insomnia 11/18/2012  . Chronic pain syndrome 11/18/2012  . HLD (hyperlipidemia) 09/01/2012  . GAD (generalized anxiety disorder) 09/01/2012    Class: Chronic  . S/P colostomy ([place]) 04/19/2012  . Normocytic anemia 10/15/2011  . Hypertension 10/13/2011  . Chronic back pain 10/13/2011    APPLEGATE, [place] MPT 08/17/2016, 7:27 PM  [facility] [address] [place], [place], [address] Phone: [phone]   Fax:  [phone]  Name: Stacy Dalton MRN: [id number] Date of Birth: Nov 23, 1950

## 2016-08-22 ENCOUNTER — Encounter: Payer: Self-pay | Admitting: Physical Therapy

## 2016-08-22 ENCOUNTER — Ambulatory Visit: Payer: Medicare Other | Admitting: Physical Therapy

## 2016-08-22 DIAGNOSIS — M546 Pain in thoracic spine: Secondary | ICD-10-CM | POA: Diagnosis not present

## 2016-08-22 NOTE — Therapy (Signed)
[facility] [place], [place], [address] Phone: [phone]   Fax:  [phone]  Physical Therapy Treatment  Patient Details  Name: Stacy Dalton MRN: [id number] Date of Birth: June 08, 1951 Referring Provider: Evelina Dun.  Encounter Date: 08/22/2016      PT End of Session - 08/22/16 1353    Visit Number 2   Number of Visits 16   Date for PT Re-Evaluation 10/16/16   PT Start Time 1351   PT Stop Time 1434   PT Time Calculation (min) 43 min   Activity Tolerance Patient tolerated treatment well   Behavior During Therapy [facility] for tasks assessed/performed      Past Medical History:  Diagnosis Date  . Anxiety    panic attacks  . COPD (chronic obstructive pulmonary disease) ([place])    told has copd, no current inhaler use  . Depression   . GERD (gastroesophageal reflux disease)   . Hypertension   . Hypothyroidism   . Insomnia   . Migraine   . Migraine   . Osteopenia     Past Surgical History:  Procedure Laterality Date  . ABDOMINAL HYSTERECTOMY    . bladder tac    . COLON SURGERY    . COLOSTOMY CLOSURE  04/19/2012   Procedure: COLOSTOMY CLOSURE;  Surgeon: Adin Hector, MD;  Location: [facility];  Service: General;  Laterality: N/A;  Laparotomy, Resection and Closure of Colostomy  . COLOSTOMY TAKEDOWN N/A 04/12/2016   Procedure: [place] TAKEDOWN;  Surgeon: Johnathan Hausen, MD;  Location: [facility];  Service: General;  Laterality: N/A;  . LAPAROTOMY  10/04/2011, colostomy also   Procedure: EXPLORATORY LAPAROTOMY;  Surgeon: Adin Hector, MD;  [facility]: [facility];  Service: General;  Laterality: N/A;  left partial colectomy with colostomy  . LAPAROTOMY  04/19/2012   Procedure: EXPLORATORY LAPAROTOMY;  Surgeon: Adin Hector, MD;  [facility]: [facility];  Service: General;  Laterality: N/A;  . LAPAROTOMY N/A 11/29/2015   Procedure: EXPLORATORY LAPAROTOMY; SUBTOTAL COLECTOMY WITH HARTMAN PROCEDURE AND END COLOSTOMY;  Surgeon:  Johnathan Hausen, MD;  [facility]: [facility];  Service: General;  Laterality: N/A;  . TUBAL LIGATION    . VENTRAL HERNIA REPAIR  04/19/2012   Procedure: HERNIA REPAIR VENTRAL ADULT;  Surgeon: Adin Hector, MD;  [facility]: [facility];  Service: General;  Laterality: N/A;    There were no vitals filed for this visit.      Subjective Assessment - 08/22/16 1353    Subjective Reports that sometimes the pain goes all the way up her back.   Pertinent History Abdominal surgeries.   Limitations Sitting;Standing   How long can you sit comfortably? 15-20 minutes.   How long can you stand comfortably? 10-15 minutes.   Patient Stated Goals Get out of pain.   Currently in Pain? Yes   Pain Score 4    Pain Location Back   Pain Orientation Mid   Pain Type Chronic pain   Pain Onset More than a month ago            [facility] PT Assessment - 08/22/16 0001      Assessment   Medical Diagnosis Chronic bilateral thoracic pain.     Precautions   Precautions None     Restrictions   Weight Bearing Restrictions No                     [facility] Adult PT Treatment/Exercise - 08/22/16 0001      Exercises  Exercises Shoulder     Shoulder Exercises: Seated   Row Strengthening;Both;20 reps;Theraband   Theraband Level (Shoulder Row) Level 1 (Yellow)   Horizontal ABduction Strengthening;Both;20 reps;Theraband   Theraband Level (Shoulder Horizontal ABduction) Level 1 (Yellow)   External Rotation Strengthening;Both;20 reps;Theraband   Theraband Level (Shoulder External Rotation) Level 1 (Yellow)     Shoulder Exercises: ROM/Strengthening   UBE (Upper Arm Bike) 120 RPM x5 min     Modalities   Modalities Electrical Stimulation;Moist Heat     Moist Heat Therapy   Number Minutes Moist Heat 15 Minutes   Moist Heat Location Other (comment)  Thoracic spine     Electrical Stimulation   Electrical Stimulation Location B thoracic paraspinals  in supine   Electrical Stimulation Action Pre-Mod    Electrical Stimulation Parameters 80-150 hz x15 min   Electrical Stimulation Goals Pain     Manual Therapy   Manual Therapy Soft tissue mobilization   Soft tissue mobilization STW/M to B thoracic paraspinals/ Latissimus Dorsi and scapular retractors to reduce pain in prone                  PT Short Term Goals - 08/17/16 1923      PT SHORT TERM GOAL #1   Title STG's=LTG's.           PT Long Term Goals - 08/17/16 1923      PT LONG TERM GOAL #1   Title Independent with a HEP.   Time 8   Period Weeks   Status New     PT LONG TERM GOAL #2   Title Sit 30 minutes with pain not > 3/10.   Time 8   Period Weeks   Status New     PT LONG TERM GOAL #3   Title Stand 20 minutes with pain not > 3/10.   Time 8   Period Weeks   Status New     PT LONG TERM GOAL #4   Title Perform ADL's with pain not > 3/10.   Time 8   Period Weeks   Status New               Plan - 08/22/16 1422    Clinical Impression Statement Patient presented in clinic with continued thoracic pain which was rated 3-4/10 by patient today upon arrival. Patient taken through gentle postural strengthening with no complaint from patient. Patient required mod VCs and tactile cues for proper technique. Very minimal increased tone noted in B thoracic musculature today upon palpation. Patient was educated that if repositioning was required to report to PTA but no negetive complaints in prone position. Normal modalities response noted following removal of the modalities. Patient experienced mid back feeling "good" following treatment.   Rehab Potential Excellent   PT Frequency 2x / week   PT Duration 8 weeks   PT Treatment/Interventions ADLs/Self Care Home Management;Electrical Stimulation;Ultrasound;Moist Heat;Therapeutic activities;Therapeutic exercise;Patient/family education;Manual techniques;Dry needling   PT Next Visit Plan Modalities and STW/M to thoracic paraspinal musculature; Dry Needling;  scapular strengthening.   Consulted and Agree with Plan of Care Patient      Patient will benefit from skilled therapeutic intervention in order to improve the following deficits and impairments:  Pain, Decreased activity tolerance  Visit Diagnosis: Pain in thoracic spine     Problem List Patient Active Problem List   Diagnosis Date Noted  . Status post colostomy takedown 04/12/2016  . Chronic constipation 12/03/2015  . Peritonitis with abscess of intestine ([place]) 11/29/2015  .  COPD (chronic obstructive pulmonary disease) ([place]) 09/20/2015  . Osteopenia 06/10/2014  . Vitamin D deficiency disease 06/10/2014  . Hypothyroidism   . Depression 11/18/2012  . Insomnia 11/18/2012  . Chronic pain syndrome 11/18/2012  . HLD (hyperlipidemia) 09/01/2012  . GAD (generalized anxiety disorder) 09/01/2012    Class: Chronic  . S/P colostomy ([place]) 04/19/2012  . Normocytic anemia 10/15/2011  . Hypertension 10/13/2011  . Chronic back pain 10/13/2011    Wynelle Fanny, PTA 08/22/2016, 2:38 PM  [facility] [address] [place], [place], [address] Phone: [phone]   Fax:  [phone]  Name: MARKYLA MECCA MRN: [id number] Date of Birth: 1950/12/04

## 2016-08-24 ENCOUNTER — Encounter: Payer: Self-pay | Admitting: Physical Therapy

## 2016-08-24 ENCOUNTER — Ambulatory Visit: Payer: Medicare Other | Admitting: Physical Therapy

## 2016-08-24 ENCOUNTER — Ambulatory Visit (INDEPENDENT_AMBULATORY_CARE_PROVIDER_SITE_OTHER): Payer: Medicare Other | Admitting: Family

## 2016-08-24 ENCOUNTER — Encounter: Payer: Self-pay | Admitting: Family

## 2016-08-24 VITALS — BP 131/85 | HR 106 | Temp 97.9°F | Ht 64.0 in | Wt 116.0 lb

## 2016-08-24 DIAGNOSIS — G894 Chronic pain syndrome: Secondary | ICD-10-CM

## 2016-08-24 DIAGNOSIS — F331 Major depressive disorder, recurrent, moderate: Secondary | ICD-10-CM | POA: Diagnosis not present

## 2016-08-24 DIAGNOSIS — G8929 Other chronic pain: Secondary | ICD-10-CM

## 2016-08-24 DIAGNOSIS — M546 Pain in thoracic spine: Secondary | ICD-10-CM

## 2016-08-24 DIAGNOSIS — F411 Generalized anxiety disorder: Secondary | ICD-10-CM | POA: Diagnosis not present

## 2016-08-24 MED ORDER — ONDANSETRON HCL 8 MG PO TABS: 8.0000 mg | ORAL_TABLET | Freq: Three times a day (TID) | ORAL | 0 refills | Status: DC | PRN

## 2016-08-24 MED ORDER — DULOXETINE HCL 30 MG PO CPEP: 90.0000 mg | ORAL_CAPSULE | Freq: Every day | ORAL | 3 refills | Status: DC

## 2016-08-24 MED ORDER — ALPRAZOLAM 0.5 MG PO TABS
0.5000 mg | ORAL_TABLET | Freq: Two times a day (BID) | ORAL | 3 refills | Status: DC | PRN
Start: 2016-08-24 — End: 2017-01-04

## 2016-08-24 MED ORDER — ALPRAZOLAM 0.5 MG PO TABS: 0.5000 mg | ORAL_TABLET | Freq: Two times a day (BID) | ORAL | 3 refills | Status: DC | PRN

## 2016-08-24 MED ORDER — HYDROCODONE-ACETAMINOPHEN 10-325 MG PO TABS: 1.0000 | ORAL_TABLET | Freq: Three times a day (TID) | ORAL | 0 refills | Status: DC | PRN

## 2016-08-24 NOTE — Progress Notes (Addendum)
Subjective:    Patient ID: Stacy Dalton, female    DOB: Mar 28, 1951, 66 y.o.   MRN: [id number]  PT presents to the office today with chronic back pain. Pt states she continues to have pain. She is taking Norco 7.5-325 mg TID. PT states this is works ok, but seems to "wear off" too soon. PT states her GAD and Depression is worse. Pt states she has tried to call psychologists but could not make an appt.  Back Pain  This is a chronic problem. The current episode started more than 1 year ago. The problem occurs constantly. The problem has been gradually worsening since onset. The pain is present in the thoracic spine. The quality of the pain is described as aching. The pain does not radiate. The pain is at a severity of 8/10. The pain is moderate. The symptoms are aggravated by standing. Pertinent negatives include no bladder incontinence, bowel incontinence, dysuria or weakness. Risk factors include sedentary lifestyle. She has tried heat, bed rest and ice (ultram) for the symptoms. The treatment provided mild relief.      Review of Systems  Gastrointestinal: Negative for bowel incontinence.  Genitourinary: Negative for bladder incontinence and dysuria.  Musculoskeletal: Positive for back pain.  Neurological: Negative for weakness.  All other systems reviewed and are negative.      Objective:   Physical Exam  Constitutional: She is oriented to person, place, and time. She appears well-developed and well-nourished.  Cardiovascular: Normal rate, regular rhythm, normal heart sounds and intact distal pulses.   Pulmonary/Chest: Effort normal and breath sounds normal.  Musculoskeletal: Normal range of motion. She exhibits tenderness (right knee).  Constant Bilateral thoracic tenderness, pain with extension and twisting  Neurological: She is alert and oriented to person, place, and time.  Skin: Skin is warm and dry.  Psychiatric: She has a normal mood and affect. Her behavior is normal.  Judgment and thought content normal.      BP 131/85   Pulse (!) 106   Temp 97.9 F (36.6 C) (Oral)   Ht 5\' 4"  (1.626 m)   Wt 116 lb (52.6 kg)   BMI 19.91 kg/m      Assessment & Plan:  1. Chronic bilateral thoracic back pain -Pt's Norco is increased to 10-325 mg TID prn from 7.5-325 mg -Pt to make appt in next few weeks for pain contract - HYDROcodone-acetaminophen (NORCO) 10-325 MG tablet; Take 1 tablet by mouth every 8 (eight) hours as needed.  Dispense: 90 tablet; Refill: 0  2. Chronic pain syndrome - HYDROcodone-acetaminophen (NORCO) 10-325 MG tablet; Take 1 tablet by mouth every 8 (eight) hours as needed.  Dispense: 90 tablet; Refill: 0  3. Moderate episode of recurrent major depressive disorder (HCC) - DULoxetine (CYMBALTA) 30 MG capsule; Take 3 capsules (90 mg total) by mouth daily.  Dispense: 90 capsule; Refill: 3 - ALPRAZolam (XANAX) 0.5 MG tablet; Take 1 tablet (0.5 mg total) by mouth 2 (two) times daily as needed for anxiety.  Dispense: 60 tablet; Refill: 3  4. GAD (generalized anxiety disorder) - DULoxetine (CYMBALTA) 30 MG capsule; Take 3 capsules (90 mg total) by mouth daily.  Dispense: 90 capsule; Refill: 3 - ALPRAZolam (XANAX) 0.5 MG tablet; Take 1 tablet (0.5 mg total) by mouth 2 (two) times daily as needed for anxiety.  Dispense: 60 tablet; Refill: 3  Referral to [facility] placed and pt had face to face visit- They suggest to increase cymbalta to 90 mg  Will increase  Cymbalta 90 mg today and pt will have phone call follow up   Continue all meds Labs pending Health Maintenance reviewed Diet and exercise encouraged RTO 1 month for pain contract   Evelina Dun, FNP

## 2016-08-24 NOTE — Therapy (Signed)
[facility] [place], [place], [address] Phone: [phone]   Fax:  [phone]  Physical Therapy Treatment  Patient Details  Name: Stacy Dalton MRN: [id number] Date of Birth: 1950-10-01 Referring Provider: Evelina Dun.  Encounter Date: 08/24/2016      PT End of Session - 08/24/16 1408    Visit Number 3   Number of Visits 16   Date for PT Re-Evaluation 10/16/16   PT Start Time [id number]   PT Stop Time 1442   PT Time Calculation (min) 48 min   Activity Tolerance Patient tolerated treatment well   Behavior During Therapy [facility] for tasks assessed/performed      Past Medical History:  Diagnosis Date  . Anxiety    panic attacks  . COPD (chronic obstructive pulmonary disease) ([place])    told has copd, no current inhaler use  . Depression   . GERD (gastroesophageal reflux disease)   . Hypertension   . Hypothyroidism   . Insomnia   . Migraine   . Migraine   . Osteopenia     Past Surgical History:  Procedure Laterality Date  . ABDOMINAL HYSTERECTOMY    . bladder tac    . COLON SURGERY    . COLOSTOMY CLOSURE  04/19/2012   Procedure: COLOSTOMY CLOSURE;  Surgeon: Adin Hector, MD;  [facility];  Service: General;  Laterality: N/A;  Laparotomy, Resection and Closure of Colostomy  . COLOSTOMY TAKEDOWN N/A 04/12/2016   Procedure: [place] TAKEDOWN;  Surgeon: Johnathan Hausen, MD;  [facility];  Service: General;  Laterality: N/A;  . LAPAROTOMY  10/04/2011, colostomy also   Procedure: EXPLORATORY LAPAROTOMY;  Surgeon: Adin Hector, MD;  [facility];  Service: General;  Laterality: N/A;  left partial colectomy with colostomy  . LAPAROTOMY  04/19/2012   Procedure: EXPLORATORY LAPAROTOMY;  Surgeon: Adin Hector, MD;  [facility];  Service: General;  Laterality: N/A;  . LAPAROTOMY N/A 11/29/2015   Procedure: EXPLORATORY LAPAROTOMY; SUBTOTAL COLECTOMY WITH HARTMAN PROCEDURE AND END COLOSTOMY;  Surgeon:  Johnathan Hausen, MD;  [facility];  Service: General;  Laterality: N/A;  . TUBAL LIGATION    . VENTRAL HERNIA REPAIR  04/19/2012   Procedure: HERNIA REPAIR VENTRAL ADULT;  Surgeon: Adin Hector, MD;  [facility];  Service: General;  Laterality: N/A;    There were no vitals filed for this visit.      Subjective Assessment - 08/24/16 1358    Subjective Patient reported ongoing pain in back and would like to be able to go without pain medication   Pertinent History Abdominal surgeries.   Limitations Sitting;Standing   How long can you sit comfortably? 15-20 minutes.   How long can you stand comfortably? 10-15 minutes.   Patient Stated Goals Get out of pain.   Currently in Pain? Yes   Pain Score 4    Pain Location Back   Pain Orientation Mid   Pain Descriptors / Indicators Aching   Pain Type Chronic pain   Pain Onset More than a month ago   Pain Frequency Constant   Aggravating Factors  ADL's   Pain Relieving Factors at rest and medication                         [facility] Adult PT Treatment/Exercise - 08/24/16 0001      Shoulder Exercises: Prone   Other Prone Exercises prone for "W", "T", "Y" no weight 2x10  each     Shoulder Exercises: Standing   Row Strengthening;Both;Theraband  2x10   Theraband Level (Shoulder Row) Level 2 (Red)     Shoulder Exercises: ROM/Strengthening   UBE (Upper Arm Bike) 120 RPM x5 min, Posture focus     Moist Heat Therapy   Number Minutes Moist Heat 15 Minutes   Moist Heat Location Other (comment)  thoracic spine     Electrical Stimulation   Electrical Stimulation Location B thoracic paraspinals   Electrical Stimulation Action premod   Electrical Stimulation Parameters 80-150hz  x72min   Electrical Stimulation Goals Pain     Manual Therapy   Manual Therapy Soft tissue mobilization   Soft tissue mobilization STW/M to B thoracic paraspinals/ Latissimus Dorsi and scapular retractors to reduce pain in prone                 PT Education - 08/24/16 1434    Education provided Yes   Education Details HEP   Person(s) Educated Patient   Methods Explanation;Demonstration;Handout   Comprehension Verbalized understanding;Returned demonstration          PT Short Term Goals - 08/17/16 1923      PT SHORT TERM GOAL #1   Title STG's=LTG's.           PT Long Term Goals - 08/24/16 1409      PT LONG TERM GOAL #1   Title Independent with a HEP.   Time 8   Period Weeks   Status On-going     PT LONG TERM GOAL #2   Title Sit 30 minutes with pain not > 3/10.   Time 8   Period Weeks   Status On-going     PT LONG TERM GOAL #3   Title Stand 20 minutes with pain not > 3/10.   Time 8   Period Weeks   Status On-going     PT LONG TERM GOAL #4   Title Perform ADL's with pain not > 3/10.   Time 8   Period Weeks   Status On-going               Plan - 08/24/16 1411    Clinical Impression Statement Patient progressing with all activities today. Patient tolerated treatment well today. Patient had some difficulty with prone "Y" exercise due to fatigue muscles. Patient understands the importance of posture techniques and strengthening/stabliizing per tolerance to improv posture and reduce pain. Patient was given HEP. Current goals ongoing due to strength and pain deficts.   Rehab Potential Excellent   PT Frequency 2x / week   PT Duration 8 weeks   PT Treatment/Interventions ADLs/Self Care Home Management;Electrical Stimulation;Ultrasound;Moist Heat;Therapeutic activities;Therapeutic exercise;Patient/family education;Manual techniques;Dry needling   PT Next Visit Plan Modalities and STW/M to thoracic paraspinal musculature; Dry Needling; scapular strengthening.   Consulted and Agree with Plan of Care Patient      Patient will benefit from skilled therapeutic intervention in order to improve the following deficits and impairments:  Pain, Decreased activity tolerance  Visit  Diagnosis: Pain in thoracic spine     Problem List Patient Active Problem List   Diagnosis Date Noted  . Status post colostomy takedown 04/12/2016  . Chronic constipation 12/03/2015  . Peritonitis with abscess of intestine ([place]) 11/29/2015  . COPD (chronic obstructive pulmonary disease) ([place]) 09/20/2015  . Osteopenia 06/10/2014  . Vitamin D deficiency disease 06/10/2014  . Hypothyroidism   . Depression 11/18/2012  . Insomnia 11/18/2012  . Chronic pain syndrome 11/18/2012  . HLD (hyperlipidemia)  09/01/2012  . GAD (generalized anxiety disorder) 09/01/2012    Class: Chronic  . S/P colostomy ([place]) 04/19/2012  . Normocytic anemia 10/15/2011  . Hypertension 10/13/2011  . Chronic back pain 10/13/2011    Phillips Climes, PTA 08/24/2016, 2:44 PM  [facility] [place], [place], [address] Phone: [phone]   Fax:  [phone]  Name: Stacy Dalton MRN: [id number] Date of Birth: 06-Mar-1951

## 2016-08-24 NOTE — Addendum Note (Signed)
Addended by: Evelina Dun A on: 08/24/2016 10:13 AM   Modules accepted: Orders

## 2016-08-24 NOTE — Patient Instructions (Signed)
Stress and Stress Management Stress is a normal reaction to life events. It is what you feel when life demands more than you are used to or more than you can handle. Some stress can be useful. For example, the stress reaction can help you catch the last bus of the day, study for a test, or meet a deadline at work. But stress that occurs too often or for too long can cause problems. It can affect your emotional health and interfere with relationships and normal daily activities. Too much stress can weaken your immune system and increase your risk for physical illness. If you already have a medical problem, stress can make it worse. What are the causes? All sorts of life events may cause stress. An event that causes stress for one person may not be stressful for another person. Major life events commonly cause stress. These may be positive or negative. Examples include losing your job, moving into a new home, getting married, having a baby, or losing a loved one. Less obvious life events may also cause stress, especially if they occur day after day or in combination. Examples include working long hours, driving in traffic, caring for children, being in debt, or being in a difficult relationship. What are the signs or symptoms? Stress may cause emotional symptoms including, the following:  Anxiety. This is feeling worried, afraid, on edge, overwhelmed, or out of control.  Anger. This is feeling irritated or impatient.  Depression. This is feeling sad, down, helpless, or guilty.  Difficulty focusing, remembering, or making decisions. Stress may cause physical symptoms, including the following:  Aches and pains. These may affect your head, neck, back, stomach, or other areas of your body.  Tight muscles or clenched jaw.  Low energy or trouble sleeping. Stress may cause unhealthy behaviors, including the following:  Eating to feel better (overeating) or skipping meals.  Sleeping too little, too  much, or both.  Working too much or putting off tasks (procrastination).  Smoking, drinking alcohol, or using drugs to feel better. How is this diagnosed? Stress is diagnosed through an assessment by your health care provider. Your health care provider will ask questions about your symptoms and any stressful life events.Your health care provider will also ask about your medical history and may order blood tests or other tests. Certain medical conditions and medicine can cause physical symptoms similar to stress. Mental illness can cause emotional symptoms and unhealthy behaviors similar to stress. Your health care provider may refer you to a mental health professional for further evaluation. How is this treated? Stress management is the recommended treatment for stress.The goals of stress management are reducing stressful life events and coping with stress in healthy ways. Techniques for reducing stressful life events include the following:  Stress identification. Self-monitor for stress and identify what causes stress for you. These skills may help you to avoid some stressful events.  Time management. Set your priorities, keep a calendar of events, and learn to say "no." These tools can help you avoid making too many commitments. Techniques for coping with stress include the following:  Rethinking the problem. Try to think realistically about stressful events rather than ignoring them or overreacting. Try to find the positives in a stressful situation rather than focusing on the negatives.  Exercise. Physical exercise can release both physical and emotional tension. The key is to find a form of exercise you enjoy and do it regularly.  Relaxation techniques. These relax the body and mind. Examples include yoga,  meditation, tai chi, biofeedback, deep breathing, progressive muscle relaxation, listening to music, being out in nature, journaling, and other hobbies. Again, the key is to find one or  more that you enjoy and can do regularly.  Healthy lifestyle. Eat a balanced diet, get plenty of sleep, and do not smoke. Avoid using alcohol or drugs to relax.  Strong support network. Spend time with family, friends, or other people you enjoy being around.Express your feelings and talk things over with someone you trust. Counseling or talktherapy with a mental health professional may be helpful if you are having difficulty managing stress on your own. Medicine is typically not recommended for the treatment of stress.Talk to your health care provider if you think you need medicine for symptoms of stress. Follow these instructions at home:  Keep all follow-up visits as directed by your health care provider.  Take all medicines as directed by your health care provider. Contact a health care provider if:  Your symptoms get worse or you start having new symptoms.  You feel overwhelmed by your problems and can no longer manage them on your own. Get help right away if:  You feel like hurting yourself or someone else. This information is not intended to replace advice given to you by your health care provider. Make sure you discuss any questions you have with your health care provider. Document Released: 12/13/2000 Document Revised: 11/25/2015 Document Reviewed: 02/11/2013 Elsevier Interactive Patient Education  2017 [employer].

## 2016-08-24 NOTE — Patient Instructions (Signed)
  Stretch Break - Chest and Shoulder Stretch   Maintaining erect posture, draw shoulders back while bringing elbows back and inward. Return to starting position. Repeat __10-20__ times every _3-4___ hours.

## 2016-08-29 ENCOUNTER — Ambulatory Visit: Payer: Medicare Other | Admitting: *Deleted

## 2016-08-29 ENCOUNTER — Other Ambulatory Visit: Payer: Self-pay | Admitting: Family Medicine

## 2016-08-29 DIAGNOSIS — M546 Pain in thoracic spine: Secondary | ICD-10-CM | POA: Diagnosis not present

## 2016-08-29 NOTE — Therapy (Signed)
[facility] [place], [place], [address] Phone: [phone]   Fax:  [phone]  Physical Therapy Treatment  Patient Details  Name: Stacy Dalton MRN: [id number] Date of Birth: 1950/09/06 Referring Provider: Evelina Dun.  Encounter Date: 08/29/2016      PT End of Session - 08/29/16 1358    Visit Number 4   Number of Visits 16   Date for PT Re-Evaluation 10/16/16   PT Start Time [id number]   PT Stop Time 1439   PT Time Calculation (min) 54 min      Past Medical History:  Diagnosis Date  . Anxiety    panic attacks  . COPD (chronic obstructive pulmonary disease) ([place])    told has copd, no current inhaler use  . Depression   . GERD (gastroesophageal reflux disease)   . Hypertension   . Hypothyroidism   . Insomnia   . Migraine   . Migraine   . Osteopenia     Past Surgical History:  Procedure Laterality Date  . ABDOMINAL HYSTERECTOMY    . bladder tac    . COLON SURGERY    . COLOSTOMY CLOSURE  04/19/2012   Procedure: COLOSTOMY CLOSURE;  Surgeon: Adin Hector, MD;  Location: [facility];  Service: General;  Laterality: N/A;  Laparotomy, Resection and Closure of Colostomy  . COLOSTOMY TAKEDOWN N/A 04/12/2016   Procedure: [place] TAKEDOWN;  Surgeon: Johnathan Hausen, MD;  Location: [facility];  Service: General;  Laterality: N/A;  . LAPAROTOMY  10/04/2011, colostomy also   Procedure: EXPLORATORY LAPAROTOMY;  Surgeon: Adin Hector, MD;  Location: [facility];  Service: General;  Laterality: N/A;  left partial colectomy with colostomy  . LAPAROTOMY  04/19/2012   Procedure: EXPLORATORY LAPAROTOMY;  Surgeon: Adin Hector, MD;  Location: [facility];  Service: General;  Laterality: N/A;  . LAPAROTOMY N/A 11/29/2015   Procedure: EXPLORATORY LAPAROTOMY; SUBTOTAL COLECTOMY WITH HARTMAN PROCEDURE AND END COLOSTOMY;  Surgeon: Johnathan Hausen, MD;  Location: [facility];  Service: General;  Laterality: N/A;  . TUBAL LIGATION    . VENTRAL  HERNIA REPAIR  04/19/2012   Procedure: HERNIA REPAIR VENTRAL ADULT;  Surgeon: Adin Hector, MD;  Location: [facility];  Service: General;  Laterality: N/A;    There were no vitals filed for this visit.      Subjective Assessment - 08/29/16 1348    Subjective Patient reported ongoing pain in back and would like to be able to go without pain medication. dID OK AFTER LAST rX   Pertinent History Abdominal surgeries.   Limitations Sitting;Standing   How long can you sit comfortably? 15-20 minutes.   How long can you stand comfortably? 10-15 minutes.   Patient Stated Goals Get out of pain.   Currently in Pain? Yes   Pain Score 4    Pain Location Back   Pain Orientation Mid   Pain Descriptors / Indicators Aching   Pain Type Chronic pain   Pain Onset More than a month ago   Pain Frequency Constant                         [facility] Adult PT Treatment/Exercise - 08/29/16 0001      Exercises   Exercises Shoulder     Shoulder Exercises: Seated   Row Strengthening;Both;Theraband  XTS pink 3x 10   Horizontal ABduction Strengthening;Both;20 reps;Theraband   Theraband Level (Shoulder Horizontal ABduction) Level 1 (Yellow)   External Rotation Strengthening;Both;20 reps;Theraband  Theraband Level (Shoulder External Rotation) Level 1 (Yellow)     Shoulder Exercises: Pulleys   Flexion 3 minutes  Lat stretch     Shoulder Exercises: ROM/Strengthening   UBE (Upper Arm Bike) 120 RPM x5 min, Posture focus     Modalities   Modalities Electrical Stimulation;Moist Heat     Moist Heat Therapy   Moist Heat Location Other (comment)  thoracic spine     Electrical Stimulation   Electrical Stimulation Location B thoracic paraspinals Premod 80-150hz  x 15 mins   Electrical Stimulation Goals Pain     Manual Therapy   Manual Therapy Soft tissue mobilization   Soft tissue mobilization STW/M to B thoracic paraspinals/ Latissimus Dorsi and scapular retractors to reduce pain in prone                   PT Short Term Goals - 08/17/16 1923      PT SHORT TERM GOAL #1   Title STG's=LTG's.           PT Long Term Goals - 08/24/16 1409      PT LONG TERM GOAL #1   Title Independent with a HEP.   Time 8   Period Weeks   Status On-going     PT LONG TERM GOAL #2   Title Sit 30 minutes with pain not > 3/10.   Time 8   Period Weeks   Status On-going     PT LONG TERM GOAL #3   Title Stand 20 minutes with pain not > 3/10.   Time 8   Period Weeks   Status On-going     PT LONG TERM GOAL #4   Title Perform ADL's with pain not > 3/10.   Time 8   Period Weeks   Status On-going               Plan - 08/29/16 1527    Clinical Impression Statement Pt arrived to clinic today doing a little better with less pain in mid back. She was able to perform postural exs with resistance bands with only some discomfort.. She still needs verbal and tactile cues for technique, but  does well  after cues.   Rehab Potential Excellent   PT Frequency 2x / week   PT Duration 8 weeks   PT Treatment/Interventions ADLs/Self Care Home Management;Electrical Stimulation;Ultrasound;Moist Heat;Therapeutic activities;Therapeutic exercise;Patient/family education;Manual techniques;Dry needling   PT Next Visit Plan Modalities and STW/M to thoracic paraspinal musculature; Dry Needling; scapular strengthening.   Consulted and Agree with Plan of Care Patient      Patient will benefit from skilled therapeutic intervention in order to improve the following deficits and impairments:     Visit Diagnosis: Pain in thoracic spine     Problem List Patient Active Problem List   Diagnosis Date Noted  . Status post colostomy takedown 04/12/2016  . Chronic constipation 12/03/2015  . Peritonitis with abscess of intestine ([place]) 11/29/2015  . COPD (chronic obstructive pulmonary disease) ([place]) 09/20/2015  . Osteopenia 06/10/2014  . Vitamin D deficiency disease 06/10/2014  .  Hypothyroidism   . Depression 11/18/2012  . Insomnia 11/18/2012  . Chronic pain syndrome 11/18/2012  . HLD (hyperlipidemia) 09/01/2012  . GAD (generalized anxiety disorder) 09/01/2012    Class: Chronic  . S/P colostomy ([place]) 04/19/2012  . Normocytic anemia 10/15/2011  . Hypertension 10/13/2011  . Chronic back pain 10/13/2011    Jheremy Boger,CHRIS, PTA 08/29/2016, 3:39 PM  [facility] [place],  [place], [address] Phone: [phone]   Fax:  [phone]  Name: Stacy Dalton MRN: [id number] Date of Birth: 1951/01/21

## 2016-08-31 ENCOUNTER — Encounter: Payer: Medicare Other | Admitting: *Deleted

## 2016-09-04 ENCOUNTER — Ambulatory Visit: Payer: Medicare Other | Admitting: Family

## 2016-09-04 ENCOUNTER — Telehealth: Payer: Self-pay | Admitting: Family

## 2016-09-04 NOTE — Telephone Encounter (Signed)
What symptoms do you have? Croupy cough with green phlegm  How long have you been sick? 2-3 days. Been around sick grandkids  Have you been seen for this problem? no  If your provider decides to give you a prescription, which pharmacy would you like for it to be sent to? [facility].   Patient informed that this information will be sent to the clinical staff for review and that they should receive a follow up call.

## 2016-09-04 NOTE — Telephone Encounter (Signed)
appt scheduled for eval of cough and congestion

## 2016-09-05 ENCOUNTER — Encounter: Payer: Medicare Other | Admitting: Physical Therapy

## 2016-09-05 ENCOUNTER — Ambulatory Visit (INDEPENDENT_AMBULATORY_CARE_PROVIDER_SITE_OTHER): Payer: Medicare Other | Admitting: Family

## 2016-09-05 ENCOUNTER — Encounter: Payer: Self-pay | Admitting: Family

## 2016-09-05 VITALS — BP 138/87 | HR 104 | Temp 97.5°F | Ht 64.0 in | Wt 119.4 lb

## 2016-09-05 DIAGNOSIS — J209 Acute bronchitis, unspecified: Secondary | ICD-10-CM

## 2016-09-05 MED ORDER — PREDNISONE 10 MG (21) PO TBPK: ORAL_TABLET | ORAL | 0 refills | Status: DC

## 2016-09-05 MED ORDER — HYDROCODONE-HOMATROPINE 5-1.5 MG/5ML PO SYRP: 5.0000 mL | ORAL_SOLUTION | Freq: Three times a day (TID) | ORAL | 0 refills | Status: DC | PRN

## 2016-09-05 MED ORDER — DOXYCYCLINE HYCLATE 100 MG PO TABS: 100.0000 mg | ORAL_TABLET | Freq: Two times a day (BID) | ORAL | 0 refills | Status: DC

## 2016-09-05 NOTE — Progress Notes (Signed)
   Subjective:    Patient ID: Stacy Dalton, female    DOB: June 07, 1951, 65 y.o.   MRN: [id number]  Cough  This is a new problem. The problem has been gradually worsening. The problem occurs every few minutes. The cough is productive of purulent sputum. Associated symptoms include chills, ear congestion, ear pain, headaches, myalgias, nasal congestion, postnasal drip, rhinorrhea, a sore throat, shortness of breath and wheezing. Pertinent negatives include no fever. The symptoms are aggravated by lying down. She has tried rest for the symptoms. The treatment provided mild relief.      Review of Systems  Constitutional: Positive for chills. Negative for fever.  HENT: Positive for ear pain, postnasal drip, rhinorrhea and sore throat.   Respiratory: Positive for cough, shortness of breath and wheezing.   Musculoskeletal: Positive for myalgias.  Neurological: Positive for headaches.  All other systems reviewed and are negative.      Objective:   Physical Exam  Constitutional: She is oriented to person, place, and time. She appears well-developed and well-nourished. No distress.  HENT:  Head: Normocephalic and atraumatic.  Right Ear: External ear normal.  Left Ear: External ear normal.  Nose: Mucosal edema and rhinorrhea present.  Mouth/Throat: Oropharynx is clear and moist.  Eyes: Pupils are equal, round, and reactive to light.  Neck: Normal range of motion. Neck supple. No thyromegaly present.  Cardiovascular: Normal rate, regular rhythm, normal heart sounds and intact distal pulses.   No murmur heard. Pulmonary/Chest: Effort normal and breath sounds normal. No respiratory distress. She has no wheezes.  Constant productive cough   Abdominal: Soft. Bowel sounds are normal. She exhibits no distension. There is no tenderness.  Musculoskeletal: Normal range of motion. She exhibits no edema or tenderness.  Neurological: She is alert and oriented to person, place, and time.  Skin: Skin  is warm and dry.  Psychiatric: She has a normal mood and affect. Her behavior is normal. Judgment and thought content normal.  Vitals reviewed.     BP 138/87   Pulse (!) 104   Temp 97.5 F (36.4 C) (Oral)   Ht 5\' 4"  (1.626 m)   Wt 119 lb 6.4 oz (54.2 kg)   BMI 20.49 kg/m      Assessment & Plan:  1. Acute bronchitis, unspecified organism - Take meds as prescribed - Use a cool mist humidifier  -Use saline nose sprays frequently -Saline irrigations of the nose can be very helpful if done frequently.  * 4X daily for 1 week*  * Use of a nettie pot can be helpful with this. Follow directions with this* -Force fluids -For any cough or congestion  Use plain Mucinex- regular strength or max strength is fine   * Children- consult with Pharmacist for dosing -For fever or aces or pains- take tylenol or ibuprofen appropriate for age and weight.  * for fevers greater than 101 orally you may alternate ibuprofen and tylenol every  3 hours. -Throat lozenges if help -New toothbrush in 3 days - doxycycline (VIBRA-TABS) 100 MG tablet; Take 1 tablet (100 mg total) by mouth 2 (two) times daily.  Dispense: 20 tablet; Refill: 0 - predniSONE (STERAPRED UNI-PAK 21 TAB) 10 MG (21) TBPK tablet; Use as directed  Dispense: 21 tablet; Refill: 0 - HYDROcodone-homatropine (HYCODAN) 5-1.5 MG/5ML syrup; Take 5 mLs by mouth every 8 (eight) hours as needed for cough.  Dispense: 120 mL; Refill: 0  Evelina Dun, FNP

## 2016-09-05 NOTE — Patient Instructions (Signed)

## 2016-09-07 ENCOUNTER — Encounter: Payer: Medicare Other | Admitting: Physical Therapy

## 2016-09-08 ENCOUNTER — Telehealth: Payer: Self-pay | Admitting: Family

## 2016-09-08 MED ORDER — ONDANSETRON HCL 4 MG PO TABS: 4.0000 mg | ORAL_TABLET | Freq: Three times a day (TID) | ORAL | 0 refills | Status: DC | PRN

## 2016-09-08 NOTE — Telephone Encounter (Signed)
Pt aware  Rx was sent to pharmacy. 

## 2016-09-08 NOTE — Telephone Encounter (Signed)
Pt has had 6 doses since being seen on Tuesday, has been nauseated since. Has tried to eat with it with no relief.

## 2016-09-08 NOTE — Telephone Encounter (Signed)
Zofran Prescription sent to pharmacy  ° °

## 2016-09-10 ENCOUNTER — Other Ambulatory Visit: Payer: Self-pay | Admitting: Family

## 2016-09-12 ENCOUNTER — Encounter: Payer: Medicare Other | Admitting: Physical Therapy

## 2016-09-13 ENCOUNTER — Other Ambulatory Visit: Payer: Self-pay | Admitting: Family

## 2016-09-14 ENCOUNTER — Encounter: Payer: Medicare Other | Admitting: *Deleted

## 2016-09-19 ENCOUNTER — Ambulatory Visit: Payer: Medicare Other | Attending: Family | Admitting: *Deleted

## 2016-09-19 ENCOUNTER — Encounter: Payer: Self-pay | Admitting: Family

## 2016-09-19 ENCOUNTER — Ambulatory Visit (INDEPENDENT_AMBULATORY_CARE_PROVIDER_SITE_OTHER): Payer: Medicare Other | Admitting: Family

## 2016-09-19 VITALS — BP 126/74 | HR 93 | Temp 97.1°F

## 2016-09-19 DIAGNOSIS — F331 Major depressive disorder, recurrent, moderate: Secondary | ICD-10-CM | POA: Diagnosis not present

## 2016-09-19 DIAGNOSIS — G8929 Other chronic pain: Secondary | ICD-10-CM | POA: Diagnosis not present

## 2016-09-19 DIAGNOSIS — G894 Chronic pain syndrome: Secondary | ICD-10-CM

## 2016-09-19 DIAGNOSIS — M546 Pain in thoracic spine: Secondary | ICD-10-CM | POA: Diagnosis not present

## 2016-09-19 DIAGNOSIS — F411 Generalized anxiety disorder: Secondary | ICD-10-CM

## 2016-09-19 MED ORDER — HYDROCODONE-ACETAMINOPHEN 10-325 MG PO TABS: 1.0000 | ORAL_TABLET | Freq: Three times a day (TID) | ORAL | 0 refills | Status: DC | PRN

## 2016-09-19 NOTE — Progress Notes (Signed)
   Subjective:    Patient ID: Stacy Dalton, female    DOB: 1951/06/10, 66 y.o.   MRN: [id number]  Pt presents to the office today to follow up on depression. Pt's Cymbalta was increased to 90 mg daily form 60 mg daily. She reports feeling better and decreased anxiety.  Depression       The patient presents with depression.  This is a chronic problem.  The current episode started more than 1 year ago.   The onset quality is gradual.   The problem occurs intermittently.  The problem has been waxing and waning since onset.  Associated symptoms include hopelessness, irritable, decreased interest and sad.  Associated symptoms include no helplessness and no suicidal ideas.  Compliance with treatment is good.  Past medical history includes chronic pain and depression.       Review of Systems  Psychiatric/Behavioral: Positive for depression. Negative for suicidal ideas.  All other systems reviewed and are negative.      Objective:   Physical Exam  Constitutional: She is oriented to person, place, and time. She appears well-developed and well-nourished. She is irritable. No distress.  HENT:  Head: Normocephalic.  Eyes: Pupils are equal, round, and reactive to light.  Neck: Normal range of motion. Neck supple. No thyromegaly present.  Cardiovascular: Normal rate, regular rhythm, normal heart sounds and intact distal pulses.   No murmur heard. Pulmonary/Chest: Effort normal and breath sounds normal. No respiratory distress. She has no wheezes.  Abdominal: Soft. Bowel sounds are normal. She exhibits no distension. There is no tenderness.  Musculoskeletal: Normal range of motion. She exhibits no edema or tenderness.  Neurological: She is alert and oriented to person, place, and time.  Skin: Skin is warm and dry.  Psychiatric: She has a normal mood and affect. Her behavior is normal. Judgment and thought content normal.  Vitals reviewed.     BP 126/74   Pulse 93   Temp 97.1 F (36.2 C)  (Oral)      Assessment & Plan:  1. Moderate episode of recurrent major depressive disorder ([place])  2. GAD (generalized anxiety disorder)  Continue Cymbalta Stress Management  Pt currently enrolled in [facility] [place], continue  RTO prn and keep chronic follow up appt   Evelina Dun, FNP

## 2016-09-19 NOTE — Patient Instructions (Signed)

## 2016-09-19 NOTE — Therapy (Signed)
[facility] [place], [place], [address] Phone: [phone]   Fax:  [phone]  Physical Therapy Treatment  Patient Details  Name: Stacy Dalton MRN: [id number] Date of Birth: 1951-03-17 Referring Provider: Evelina Dun.  Encounter Date: 09/19/2016      PT End of Session - 09/19/16 1524    Visit Number 5   Number of Visits 16   Date for PT Re-Evaluation 10/16/16   PT Start Time 7564   PT Stop Time 1522   PT Time Calculation (min) 51 min      Past Medical History:  Diagnosis Date  . Anxiety    panic attacks  . COPD (chronic obstructive pulmonary disease) ([place])    told has copd, no current inhaler use  . Depression   . GERD (gastroesophageal reflux disease)   . Hypertension   . Hypothyroidism   . Insomnia   . Migraine   . Migraine   . Osteopenia     Past Surgical History:  Procedure Laterality Date  . ABDOMINAL HYSTERECTOMY    . bladder tac    . COLON SURGERY    . COLOSTOMY CLOSURE  04/19/2012   Procedure: COLOSTOMY CLOSURE;  Surgeon: Adin Hector, MD;  [facility]: [facility];  Service: General;  Laterality: N/A;  Laparotomy, Resection and Closure of Colostomy  . COLOSTOMY TAKEDOWN N/A 04/12/2016   Procedure: [place] TAKEDOWN;  Surgeon: Johnathan Hausen, MD;  [facility]: [facility];  Service: General;  Laterality: N/A;  . LAPAROTOMY  10/04/2011, colostomy also   Procedure: EXPLORATORY LAPAROTOMY;  Surgeon: Adin Hector, MD;  [facility]: [facility];  Service: General;  Laterality: N/A;  left partial colectomy with colostomy  . LAPAROTOMY  04/19/2012   Procedure: EXPLORATORY LAPAROTOMY;  Surgeon: Adin Hector, MD;  [facility];  Service: General;  Laterality: N/A;  . LAPAROTOMY N/A 11/29/2015   Procedure: EXPLORATORY LAPAROTOMY; SUBTOTAL COLECTOMY WITH HARTMAN PROCEDURE AND END COLOSTOMY;  Surgeon: Johnathan Hausen, MD;  [facility]: [facility];  Service: General;  Laterality: N/A;  . TUBAL LIGATION    . VENTRAL  HERNIA REPAIR  04/19/2012   Procedure: HERNIA REPAIR VENTRAL ADULT;  Surgeon: Adin Hector, MD;  [facility]: [facility];  Service: General;  Laterality: N/A;    There were no vitals filed for this visit.      Subjective Assessment - 09/19/16 1435    Subjective Patient reported ongoing pain in back and would like to be able to go without pain medication. dID OK AFTER LAST rX. Not much pain   Pertinent History Abdominal surgeries.   Limitations Sitting;Standing   How long can you sit comfortably? 15-20 minutes.   How long can you stand comfortably? 10-15 minutes.   Patient Stated Goals Get out of pain.   Currently in Pain? Yes   Pain Score 4    Pain Location Back   Pain Orientation Mid   Pain Descriptors / Indicators Aching   Pain Type Chronic pain   Pain Onset More than a month ago   Pain Frequency Constant                         [facility] Adult PT Treatment/Exercise - 09/19/16 0001      Exercises   Exercises Shoulder     Shoulder Exercises: Seated   Row Strengthening;Both;Theraband  XTS pink 3x 10   Horizontal ABduction Strengthening;Both;20 reps;Theraband   Theraband Level (Shoulder Horizontal ABduction) Level 1 (Yellow)   External  Rotation Strengthening;Both;20 reps;Theraband   Theraband Level (Shoulder External Rotation) Level 1 (Yellow)     Shoulder Exercises: Pulleys   Flexion 3 minutes  Lat stretch     Shoulder Exercises: ROM/Strengthening   UBE (Upper Arm Bike) 120 RPM x5 min, Posture focus     Modalities   Modalities Electrical Stimulation;Moist Heat;Ultrasound     Moist Heat Therapy   Number Minutes Moist Heat 15 Minutes   Moist Heat Location Other (comment)  thoracic spine     Electrical Stimulation   Electrical Stimulation Location B thoracic paraspinals Premod 80-150hz  x 15 mins  hooklying   Electrical Stimulation Goals Pain     Ultrasound   Ultrasound Location Thoracic paras    Ultrasound Parameters 1.5 w/cm2 x 10 mins  prone    Ultrasound Goals Pain     Manual Therapy   Manual Therapy --   Soft tissue mobilization --                  PT Short Term Goals - 08/17/16 1923      PT SHORT TERM GOAL #1   Title STG's=LTG's.           PT Long Term Goals - 08/24/16 1409      PT LONG TERM GOAL #1   Title Independent with a HEP.   Time 8   Period Weeks   Status On-going     PT LONG TERM GOAL #2   Title Sit 30 minutes with pain not > 3/10.   Time 8   Period Weeks   Status On-going     PT LONG TERM GOAL #3   Title Stand 20 minutes with pain not > 3/10.   Time 8   Period Weeks   Status On-going     PT LONG TERM GOAL #4   Title Perform ADL's with pain not > 3/10.   Time 8   Period Weeks   Status On-going               Plan - 09/19/16 1527    Clinical Impression Statement Pt arrived today doing about the same with thoracic pain. She does get relief from Rxs, but pain tends to come back withactivity. She was able to perform Therex for scapular strengthening with minmal increase in pain and had a normal response to modalities.   F/U with surgeon this week   Rehab Potential Excellent   PT Frequency 2x / week   PT Duration 8 weeks   PT Treatment/Interventions ADLs/Self Care Home Management;Electrical Stimulation;Ultrasound;Moist Heat;Therapeutic activities;Therapeutic exercise;Patient/family education;Manual techniques;Dry needling   PT Next Visit Plan Modalities and STW/M to thoracic paraspinal musculature; Dry Needling; scapular strengthening.   core strengthening if MD approves   Consulted and Agree with Plan of Care Patient      Patient will benefit from skilled therapeutic intervention in order to improve the following deficits and impairments:  Pain, Decreased activity tolerance  Visit Diagnosis: Pain in thoracic spine     Problem List Patient Active Problem List   Diagnosis Date Noted  . Status post colostomy takedown 04/12/2016  . Chronic constipation 12/03/2015  .  Peritonitis with abscess of intestine ([place]) 11/29/2015  . COPD (chronic obstructive pulmonary disease) ([place]) 09/20/2015  . Osteopenia 06/10/2014  . Vitamin D deficiency disease 06/10/2014  . Hypothyroidism   . Depression 11/18/2012  . Insomnia 11/18/2012  . Chronic pain syndrome 11/18/2012  . HLD (hyperlipidemia) 09/01/2012  . GAD (generalized anxiety disorder) 09/01/2012  Class: Chronic  . S/P colostomy ([place]) 04/19/2012  . Normocytic anemia 10/15/2011  . Hypertension 10/13/2011  . Chronic back pain 10/13/2011    Tynika Luddy,CHRIS, PTA 09/19/2016, 3:39 PM  [facility] [place], [place], [address] Phone: [phone]   Fax:  [phone]  Name: Stacy Dalton MRN: [id number] Date of Birth: 1951/04/09

## 2016-09-21 ENCOUNTER — Encounter: Payer: Medicare Other | Admitting: *Deleted

## 2016-09-25 ENCOUNTER — Other Ambulatory Visit: Payer: Self-pay | Admitting: Family

## 2016-09-25 MED ORDER — ZOLPIDEM TARTRATE ER 6.25 MG PO TBCR: 6.2500 mg | EXTENDED_RELEASE_TABLET | Freq: Every evening | ORAL | 1 refills | Status: DC | PRN

## 2016-09-26 ENCOUNTER — Telehealth: Payer: Self-pay | Admitting: *Deleted

## 2016-09-26 ENCOUNTER — Ambulatory Visit: Payer: Medicare Other | Admitting: *Deleted

## 2016-09-26 DIAGNOSIS — M546 Pain in thoracic spine: Secondary | ICD-10-CM

## 2016-09-26 NOTE — Telephone Encounter (Signed)
Ambein refill called to [facility].

## 2016-09-26 NOTE — Therapy (Signed)
[facility] [place], [place], [address] Phone: [phone]   Fax:  [phone]  Physical Therapy Treatment  Patient Details  Name: Stacy Dalton MRN: [id number] Date of Birth: 07/11/1950 Referring Provider: Evelina Dun.  Encounter Date: 09/26/2016      PT End of Session - 09/26/16 1446    Visit Number 6   Number of Visits 16   Date for PT Re-Evaluation 10/16/16   PT Start Time 1430   PT Stop Time 1520   PT Time Calculation (min) 50 min      Past Medical History:  Diagnosis Date  . Anxiety    panic attacks  . COPD (chronic obstructive pulmonary disease) ([place])    told has copd, no current inhaler use  . Depression   . GERD (gastroesophageal reflux disease)   . Hypertension   . Hypothyroidism   . Insomnia   . Migraine   . Migraine   . Osteopenia     Past Surgical History:  Procedure Laterality Date  . ABDOMINAL HYSTERECTOMY    . bladder tac    . COLON SURGERY    . COLOSTOMY CLOSURE  04/19/2012   Procedure: COLOSTOMY CLOSURE;  Surgeon: Adin Hector, MD;  [facility]: [facility];  Service: General;  Laterality: N/A;  Laparotomy, Resection and Closure of Colostomy  . COLOSTOMY TAKEDOWN N/A 04/12/2016   Procedure: [place] TAKEDOWN;  Surgeon: Johnathan Hausen, MD;  [facility];  Service: General;  Laterality: N/A;  . LAPAROTOMY  10/04/2011, colostomy also   Procedure: EXPLORATORY LAPAROTOMY;  Surgeon: Adin Hector, MD;  [facility];  Service: General;  Laterality: N/A;  left partial colectomy with colostomy  . LAPAROTOMY  04/19/2012   Procedure: EXPLORATORY LAPAROTOMY;  Surgeon: Adin Hector, MD;  [facility];  Service: General;  Laterality: N/A;  . LAPAROTOMY N/A 11/29/2015   Procedure: EXPLORATORY LAPAROTOMY; SUBTOTAL COLECTOMY WITH HARTMAN PROCEDURE AND END COLOSTOMY;  Surgeon: Johnathan Hausen, MD;  [facility];  Service: General;  Laterality: N/A;  . TUBAL LIGATION    . VENTRAL  HERNIA REPAIR  04/19/2012   Procedure: HERNIA REPAIR VENTRAL ADULT;  Surgeon: Adin Hector, MD;  [facility];  Service: General;  Laterality: N/A;    There were no vitals filed for this visit.      Subjective Assessment - 09/26/16 1443    Subjective Back pain is a little less today. Rx's are helping a little   Pertinent History Abdominal surgeries.   Limitations Sitting;Standing   How long can you sit comfortably? 15-20 minutes.   How long can you stand comfortably? 10-15 minutes.   Patient Stated Goals Get out of pain.   Currently in Pain? Yes   Pain Score 4    Pain Location Back   Pain Orientation Mid   Pain Descriptors / Indicators Aching   Pain Type Chronic pain   Pain Frequency Constant                         [facility] Adult PT Treatment/Exercise - 09/26/16 0001      Exercises   Exercises Shoulder     Shoulder Exercises: Seated   Row Strengthening;Both;Theraband  XTS pink 4x 10     Shoulder Exercises: Pulleys   Flexion 3 minutes  Lat stretch     Shoulder Exercises: ROM/Strengthening   UBE (Upper Arm Bike) 120 RPM x5 min, Posture focus     Modalities  Modalities Electrical Stimulation;Moist Heat;Ultrasound     Moist Heat Therapy   Number Minutes Moist Heat 15 Minutes   Moist Heat Location Other (comment)     Electrical Stimulation   Electrical Stimulation Location B thoracic paraspinals Premod 80-150hz  x 15 mins  hooklying   Electrical Stimulation Goals Pain     Ultrasound   Ultrasound Location Thoracic paras   Ultrasound Parameters 1.5 wcm2 x 10 mins prone   Ultrasound Goals Pain     Manual Therapy   Manual Therapy Soft tissue mobilization   Soft tissue mobilization STW/ IASTM  to B thoracic paraspinals/ Latissimus Dorsi and scapular retractors to reduce pain in prone      Drawin and drawin with marching             PT Short Term Goals - 08/17/16 1923      PT SHORT TERM GOAL #1   Title STG's=LTG's.            PT Long Term Goals - 08/24/16 1409      PT LONG TERM GOAL #1   Title Independent with a HEP.   Time 8   Period Weeks   Status On-going     PT LONG TERM GOAL #2   Title Sit 30 minutes with pain not > 3/10.   Time 8   Period Weeks   Status On-going     PT LONG TERM GOAL #3   Title Stand 20 minutes with pain not > 3/10.   Time 8   Period Weeks   Status On-going     PT LONG TERM GOAL #4   Title Perform ADL's with pain not > 3/10.   Time 8   Period Weeks   Status On-going               Plan - 09/26/16 1448    Clinical Impression Statement Pt arrived to clinic today feeling a little better with less pain in mid-back 4/10. She was able to complete therex for posture and strengthening with minimal increase in pain. We also started some core activation exs with drawin and marching. normal response to modalities.   Rehab Potential Excellent   PT Frequency 2x / week   PT Duration 8 weeks   PT Treatment/Interventions ADLs/Self Care Home Management;Electrical Stimulation;Ultrasound;Moist Heat;Therapeutic activities;Therapeutic exercise;Patient/family education;Manual techniques;Dry needling   PT Next Visit Plan Modalities and STW/M to thoracic paraspinal musculature; Dry Needling; scapular strengthening.      Consulted and Agree with Plan of Care Patient      Patient will benefit from skilled therapeutic intervention in order to improve the following deficits and impairments:  Pain, Decreased activity tolerance  Visit Diagnosis: Pain in thoracic spine     Problem List Patient Active Problem List   Diagnosis Date Noted  . Status post colostomy takedown 04/12/2016  . Chronic constipation 12/03/2015  . Peritonitis with abscess of intestine ([place]) 11/29/2015  . COPD (chronic obstructive pulmonary disease) ([place]) 09/20/2015  . Osteopenia 06/10/2014  . Vitamin D deficiency disease 06/10/2014  . Hypothyroidism   . Depression 11/18/2012  . Insomnia 11/18/2012  . Chronic  pain syndrome 11/18/2012  . HLD (hyperlipidemia) 09/01/2012  . GAD (generalized anxiety disorder) 09/01/2012    Class: Chronic  . S/P colostomy ([place]) 04/19/2012  . Normocytic anemia 10/15/2011  . Hypertension 10/13/2011  . Chronic back pain 10/13/2011    RAMSEUR,CHRIS, PTA 09/26/2016, 3:34 PM  [facility] [address] [place], [place], [address] Phone: [phone]  Fax:  [phone]  Name: Stacy Dalton MRN: [id number] Date of Birth: December 11, 1950

## 2016-10-03 ENCOUNTER — Encounter: Payer: Medicare Other | Admitting: Physical Therapy

## 2016-10-04 ENCOUNTER — Ambulatory Visit: Payer: Medicare Other | Attending: Family | Admitting: Physical Therapy

## 2016-10-04 DIAGNOSIS — M546 Pain in thoracic spine: Secondary | ICD-10-CM | POA: Diagnosis not present

## 2016-10-04 NOTE — Therapy (Signed)
[facility] [place], [place], [address] Phone: [phone]   Fax:  [phone]  Physical Therapy Treatment  Patient Details  Name: Stacy Dalton MRN: [id number] Date of Birth: July 21, 1950 Referring Provider: Evelina Dun.  Encounter Date: 10/04/2016      PT End of Session - 10/04/16 1258    Visit Number 7   Number of Visits 16   Date for PT Re-Evaluation 10/16/16   PT Start Time 1233   PT Stop Time 1315   PT Time Calculation (min) 42 min   Activity Tolerance Patient tolerated treatment well   Behavior During Therapy WFL for tasks assessed/performed      Past Medical History:  Diagnosis Date  . Anxiety    panic attacks  . COPD (chronic obstructive pulmonary disease) ([place])    told has copd, no current inhaler use  . Depression   . GERD (gastroesophageal reflux disease)   . Hypertension   . Hypothyroidism   . Insomnia   . Migraine   . Migraine   . Osteopenia     Past Surgical History:  Procedure Laterality Date  . ABDOMINAL HYSTERECTOMY    . bladder tac    . COLON SURGERY    . COLOSTOMY CLOSURE  04/19/2012   Procedure: COLOSTOMY CLOSURE;  Surgeon: Adin Hector, MD;  [facility];  Service: General;  Laterality: N/A;  Laparotomy, Resection and Closure of Colostomy  . COLOSTOMY TAKEDOWN N/A 04/12/2016   Procedure: [place] TAKEDOWN;  Surgeon: Johnathan Hausen, MD;  [facility];  Service: General;  Laterality: N/A;  . LAPAROTOMY  10/04/2011, colostomy also   Procedure: EXPLORATORY LAPAROTOMY;  Surgeon: Adin Hector, MD;  [facility];  Service: General;  Laterality: N/A;  left partial colectomy with colostomy  . LAPAROTOMY  04/19/2012   Procedure: EXPLORATORY LAPAROTOMY;  Surgeon: Adin Hector, MD;  [facility];  Service: General;  Laterality: N/A;  . LAPAROTOMY N/A 11/29/2015   Procedure: EXPLORATORY LAPAROTOMY; SUBTOTAL COLECTOMY WITH HARTMAN PROCEDURE AND END COLOSTOMY;  Surgeon:  Johnathan Hausen, MD;  [facility];  Service: General;  Laterality: N/A;  . TUBAL LIGATION    . VENTRAL HERNIA REPAIR  04/19/2012   Procedure: HERNIA REPAIR VENTRAL ADULT;  Surgeon: Adin Hector, MD;  [facility];  Service: General;  Laterality: N/A;    There were no vitals filed for this visit.      Subjective Assessment - 10/04/16 1235    Subjective having a little pain today, took pain medication today.  has been very depressed recently and unsure how to proceed.  wants to see counselor face to face.   Pertinent History Abdominal surgeries.   Patient Stated Goals Get out of pain.   Currently in Pain? Yes   Pain Score 3    Pain Location Back   Pain Orientation Mid   Pain Descriptors / Indicators Aching   Pain Type Chronic pain   Pain Onset More than a month ago   Pain Frequency Constant   Aggravating Factors  ADLs   Pain Relieving Factors at rest and medication                         [facility] Adult PT Treatment/Exercise - 10/04/16 1238      Exercises   Exercises Lumbar     Lumbar Exercises: Stretches   Single Knee to Chest Stretch 2 reps;30 seconds   Single Knee to Chest Stretch  Limitations bil   Lower Trunk Rotation 2 reps;30 seconds   Lower Trunk Rotation Limitations bil   Quadruped Mid Back Stretch 2 reps;30 seconds   Quadruped Mid Back Stretch Limitations childs pose     Lumbar Exercises: Aerobic   Stationary Bike NuStep L5 x 8 min     Modalities   Modalities Electrical Stimulation;Moist Heat;Ultrasound     Moist Heat Therapy   Number Minutes Moist Heat 15 Minutes   Moist Heat Location --  thoracic spine     Electrical Stimulation   Electrical Stimulation Location B thoracic paraspinals Premod 80-150hz  x 15 mins  hooklying   Electrical Stimulation Action IFC   Electrical Stimulation Parameters to tolerance x 15 min   Electrical Stimulation Goals Pain                  PT Short Term Goals - 08/17/16 1923      PT  SHORT TERM GOAL #1   Title STG's=LTG's.           PT Long Term Goals - 08/24/16 1409      PT LONG TERM GOAL #1   Title Independent with a HEP.   Time 8   Period Weeks   Status On-going     PT LONG TERM GOAL #2   Title Sit 30 minutes with pain not > 3/10.   Time 8   Period Weeks   Status On-going     PT LONG TERM GOAL #3   Title Stand 20 minutes with pain not > 3/10.   Time 8   Period Weeks   Status On-going     PT LONG TERM GOAL #4   Title Perform ADL's with pain not > 3/10.   Time 8   Period Weeks   Status On-going               Plan - 10/04/16 1258    Clinical Impression Statement Pt reports increased depression and feels this is affecting treatment and ability to progress.  Recommended pt follow up with MD which she plans to do.  Progress limited due to other factors unrelated to physical therapy.   PT Treatment/Interventions ADLs/Self Care Home Management;Electrical Stimulation;Ultrasound;Moist Heat;Therapeutic activities;Therapeutic exercise;Patient/family education;Manual techniques;Dry needling   PT Next Visit Plan Modalities and STW/M to thoracic paraspinal musculature; Dry Needling; scapular strengthening.      Consulted and Agree with Plan of Care Patient      Patient will benefit from skilled therapeutic intervention in order to improve the following deficits and impairments:  Pain, Decreased activity tolerance  Visit Diagnosis: Pain in thoracic spine     Problem List Patient Active Problem List   Diagnosis Date Noted  . Status post colostomy takedown 04/12/2016  . Chronic constipation 12/03/2015  . Peritonitis with abscess of intestine ([place]) 11/29/2015  . COPD (chronic obstructive pulmonary disease) ([place]) 09/20/2015  . Osteopenia 06/10/2014  . Vitamin D deficiency disease 06/10/2014  . Hypothyroidism   . Depression 11/18/2012  . Insomnia 11/18/2012  . Chronic pain syndrome 11/18/2012  . HLD (hyperlipidemia) 09/01/2012  . GAD  (generalized anxiety disorder) 09/01/2012    Class: Chronic  . S/P colostomy ([place]) 04/19/2012  . Normocytic anemia 10/15/2011  . Hypertension 10/13/2011  . Chronic back pain 10/13/2011      Laureen Abrahams, PT, DPT 10/04/16 1:01 PM    [facility] [place], [place], [address] Phone: [phone]   Fax:  [phone]  Name: Stacy Malecki  Dalton MRN: [id number] Date of Birth: 1950-12-15

## 2016-10-09 ENCOUNTER — Telehealth: Payer: Self-pay | Admitting: Family

## 2016-10-09 NOTE — Telephone Encounter (Signed)
For women and elderly the max dose of this medication is 6.25 mg. If this is not working we can try another medication.

## 2016-10-09 NOTE — Telephone Encounter (Signed)
Patient called stating that she has previously been taking 12.5 of Ambien is wanting to know why it was decreased to 6.25

## 2016-10-09 NOTE — Telephone Encounter (Signed)
Patient aware of why medication was changed and will call back if does not work

## 2016-10-10 ENCOUNTER — Encounter: Payer: Self-pay | Admitting: Physical Therapy

## 2016-10-10 ENCOUNTER — Encounter: Payer: Self-pay | Admitting: Family

## 2016-10-10 ENCOUNTER — Ambulatory Visit: Payer: Medicare Other | Admitting: Physical Therapy

## 2016-10-10 ENCOUNTER — Ambulatory Visit (INDEPENDENT_AMBULATORY_CARE_PROVIDER_SITE_OTHER): Payer: Medicare Other | Admitting: Family

## 2016-10-10 VITALS — BP 127/74 | HR 93 | Temp 96.8°F | Ht 64.0 in | Wt 114.0 lb

## 2016-10-10 DIAGNOSIS — Z0289 Encounter for other administrative examinations: Secondary | ICD-10-CM | POA: Diagnosis not present

## 2016-10-10 DIAGNOSIS — G894 Chronic pain syndrome: Secondary | ICD-10-CM | POA: Diagnosis not present

## 2016-10-10 DIAGNOSIS — M546 Pain in thoracic spine: Secondary | ICD-10-CM | POA: Diagnosis not present

## 2016-10-10 DIAGNOSIS — G8929 Other chronic pain: Secondary | ICD-10-CM | POA: Diagnosis not present

## 2016-10-10 DIAGNOSIS — G4700 Insomnia, unspecified: Secondary | ICD-10-CM

## 2016-10-10 DIAGNOSIS — F1120 Opioid dependence, uncomplicated: Secondary | ICD-10-CM

## 2016-10-10 MED ORDER — SUVOREXANT 10 MG PO TABS: 10.0000 mg | ORAL_TABLET | Freq: Every evening | ORAL | 3 refills | Status: DC | PRN

## 2016-10-10 MED ORDER — TRAMADOL HCL 50 MG PO TABS: 50.0000 mg | ORAL_TABLET | Freq: Two times a day (BID) | ORAL | 3 refills | Status: DC | PRN

## 2016-10-10 NOTE — Patient Instructions (Signed)
Chronic Back Pain When back pain lasts longer than 3 months, it is called chronic back pain.The cause of your back pain may not be known. Some common causes include:  Wear and tear (degenerative disease) of the bones, ligaments, or disks in your back.  Inflammation and stiffness in your back (arthritis). People who have chronic back pain often go through certain periods in which the pain is more intense (flare-ups). Many people can learn to manage the pain with home care. Follow these instructions at home: Pay attention to any changes in your symptoms. Take these actions to help with your pain: Activity   Avoid bending and activities that make the problem worse.  Do not sit or stand in one place for long periods of time.  Take brief periods of rest throughout the day. This will reduce your pain. Resting in a lying or standing position is usually better than sitting to rest.  When you are resting for longer periods, mix in some mild activity or stretching between periods of rest. This will help to prevent stiffness and pain.  Get regular exercise. Ask your health care provider what activities are safe for you.  Do not lift anything that is heavier than 10 lb (4.5 kg). Always use proper lifting technique, which includes:  Bending your knees.  Keeping the load close to your body.  Avoiding twisting. Managing pain   If directed, apply ice to the painful area. Your health care provider may recommend applying ice during the first 24-48 hours after a flare-up begins.  Put ice in a plastic bag.  Place a towel between your skin and the bag.  Leave the ice on for 20 minutes, 2-3 times per day.  After icing, apply heat to the affected area as often as told by your health care provider. Use the heat source that your health care provider recommends, such as a moist heat pack or a heating pad.  Place a towel between your skin and the heat source.  Leave the heat on for 20-30  minutes.  Remove the heat if your skin turns bright red. This is especially important if you are unable to feel pain, heat, or cold. You may have a greater risk of getting burned.  Try soaking in a warm tub.  Take over-the-counter and prescription medicines only as told by your health care provider.  Keep all follow-up visits as told by your health care provider. This is important. Contact a health care provider if:  You have pain that is not relieved with rest or medicine. Get help right away if:  You have weakness or numbness in one or both of your legs or feet.  You have trouble controlling your bladder or your bowels.  You have nausea or vomiting.  You have pain in your abdomen.  You have shortness of breath or you faint. This information is not intended to replace advice given to you by your health care provider. Make sure you discuss any questions you have with your health care provider. Document Released: 07/27/2004 Document Revised: 10/28/2015 Document Reviewed: 12/07/2014 Elsevier Interactive Patient Education  2017 Elsevier Inc.  

## 2016-10-10 NOTE — Therapy (Signed)
[facility] [place], [place], [address] Phone: [phone]   Fax:  [phone]  Physical Therapy Treatment  Patient Details  Name: Stacy Dalton MRN: [id number] Date of Birth: 1951/06/01 Referring Provider: Evelina Dun.  Encounter Date: 10/10/2016      PT End of Session - 10/10/16 1431    Visit Number 8   Number of Visits 16   Date for PT Re-Evaluation 10/16/16   PT Start Time 1430   PT Stop Time 1522   PT Time Calculation (min) 52 min   Activity Tolerance Patient tolerated treatment well   Behavior During Therapy [facility] for tasks assessed/performed      Past Medical History:  Diagnosis Date  . Anxiety    panic attacks  . COPD (chronic obstructive pulmonary disease) ([place])    told has copd, no current inhaler use  . Depression   . GERD (gastroesophageal reflux disease)   . Hypertension   . Hypothyroidism   . Insomnia   . Migraine   . Migraine   . Osteopenia     Past Surgical History:  Procedure Laterality Date  . ABDOMINAL HYSTERECTOMY    . bladder tac    . COLON SURGERY    . COLOSTOMY CLOSURE  04/19/2012   Procedure: COLOSTOMY CLOSURE;  Surgeon: Adin Hector, MD;  [facility];  Service: General;  Laterality: N/A;  Laparotomy, Resection and Closure of Colostomy  . COLOSTOMY TAKEDOWN N/A 04/12/2016   Procedure: [place] TAKEDOWN;  Surgeon: Johnathan Hausen, MD;  [facility];  Service: General;  Laterality: N/A;  . LAPAROTOMY  10/04/2011, colostomy also   Procedure: EXPLORATORY LAPAROTOMY;  Surgeon: Adin Hector, MD;  [facility];  Service: General;  Laterality: N/A;  left partial colectomy with colostomy  . LAPAROTOMY  04/19/2012   Procedure: EXPLORATORY LAPAROTOMY;  Surgeon: Adin Hector, MD;  [facility];  Service: General;  Laterality: N/A;  . LAPAROTOMY N/A 11/29/2015   Procedure: EXPLORATORY LAPAROTOMY; SUBTOTAL COLECTOMY WITH HARTMAN PROCEDURE AND END COLOSTOMY;  Surgeon:  Johnathan Hausen, MD;  [facility];  Service: General;  Laterality: N/A;  . TUBAL LIGATION    . VENTRAL HERNIA REPAIR  04/19/2012   Procedure: HERNIA REPAIR VENTRAL ADULT;  Surgeon: Adin Hector, MD;  [facility];  Service: General;  Laterality: N/A;    There were no vitals filed for this visit.      Subjective Assessment - 10/10/16 1431    Subjective Reports that her back isn't too bad right now.   Pertinent History Abdominal surgeries.   Limitations Sitting;Standing   How long can you sit comfortably? 15-20 minutes.   How long can you stand comfortably? 10-15 minutes.   Patient Stated Goals Get out of pain.   Currently in Pain? Yes   Pain Score 2    Pain Location Back   Pain Type Chronic pain   Pain Onset More than a month ago            [facility] PT Assessment - 10/10/16 0001      Assessment   Medical Diagnosis Chronic bilateral thoracic pain.     Precautions   Precautions None     Restrictions   Weight Bearing Restrictions No                     [facility] Adult PT Treatment/Exercise - 10/10/16 0001      Shoulder Exercises: Supine   Other Supine Exercises B  shoulder D2 strengthening with yellow theraband x20 reps each     Shoulder Exercises: Seated   Row Strengthening;Both;20 reps;Theraband   Theraband Level (Shoulder Row) Level 1 (Yellow)   Horizontal ABduction Strengthening;Both;20 reps;Theraband   Theraband Level (Shoulder Horizontal ABduction) Level 1 (Yellow)   External Rotation Strengthening;Both;20 reps;Theraband   Theraband Level (Shoulder External Rotation) Level 1 (Yellow)     Shoulder Exercises: Standing   Other Standing Exercises Over counter stretch x10 reps   Other Standing Exercises B shoulder scaption AROM x15 reps, standing B shoulder snow angel x15 reps     Shoulder Exercises: ROM/Strengthening   UBE (Upper Arm Bike) 90 RPM x5 min   Wall Pushups 20 reps     Modalities   Modalities Electrical Stimulation;Moist Heat      Moist Heat Therapy   Number Minutes Moist Heat 15 Minutes   Moist Heat Location Other (comment)  thoracic spine     Electrical Stimulation   Electrical Stimulation Location B thoracic paraspinals   Electrical Stimulation Action IFC   Electrical Stimulation Parameters 80-150 hz x15 min   Electrical Stimulation Goals Pain     Manual Therapy   Manual Therapy Soft tissue mobilization   Soft tissue mobilization STW to B thoracic paraspinals to reduce pain                  PT Short Term Goals - 08/17/16 1923      PT SHORT TERM GOAL #1   Title STG's=LTG's.           PT Long Term Goals - 08/24/16 1409      PT LONG TERM GOAL #1   Title Independent with a HEP.   Time 8   Period Weeks   Status On-going     PT LONG TERM GOAL #2   Title Sit 30 minutes with pain not > 3/10.   Time 8   Period Weeks   Status On-going     PT LONG TERM GOAL #3   Title Stand 20 minutes with pain not > 3/10.   Time 8   Period Weeks   Status On-going     PT LONG TERM GOAL #4   Title Perform ADL's with pain not > 3/10.   Time 8   Period Weeks   Status On-going               Plan - 10/10/16 1513    Clinical Impression Statement Patient tolerated today's treatment well with no increased pain with any exercises today per patient report. Patient guided through many postural strengthening exercises today actively and some with low resistance. Minimal increased tone palpated in B thoracic paraspinals noted today during manual therapy. Normal modalities response noted following removal of the modalities. Patient still experiencing pain with ADLs such as standing at the sink and with sitting per patient report.    Rehab Potential Excellent   PT Frequency 2x / week   PT Duration 8 weeks   PT Treatment/Interventions ADLs/Self Care Home Management;Electrical Stimulation;Ultrasound;Moist Heat;Therapeutic activities;Therapeutic exercise;Patient/family education;Manual techniques;Dry  needling   PT Next Visit Plan Modalities and STW/M to thoracic paraspinal musculature; Dry Needling; scapular strengthening.      Consulted and Agree with Plan of Care Patient      Patient will benefit from skilled therapeutic intervention in order to improve the following deficits and impairments:  Pain, Decreased activity tolerance  Visit Diagnosis: Pain in thoracic spine     Problem List Patient Active Problem List  Diagnosis Date Noted  . Pain medication agreement signed 10/10/2016  . Opioid dependence ([place]) 10/10/2016  . Status post colostomy takedown 04/12/2016  . Chronic constipation 12/03/2015  . Peritonitis with abscess of intestine ([place]) 11/29/2015  . COPD (chronic obstructive pulmonary disease) ([place]) 09/20/2015  . Osteopenia 06/10/2014  . Vitamin D deficiency disease 06/10/2014  . Hypothyroidism   . Depression 11/18/2012  . Insomnia 11/18/2012  . Chronic pain syndrome 11/18/2012  . HLD (hyperlipidemia) 09/01/2012  . GAD (generalized anxiety disorder) 09/01/2012    Class: Chronic  . S/P colostomy ([place]) 04/19/2012  . Normocytic anemia 10/15/2011  . Hypertension 10/13/2011  . Chronic back pain 10/13/2011    Wynelle Fanny, PTA 10/10/2016, 3:34 PM  [facility] [address] [place], [place], [address] Phone: [phone]   Fax:  [phone]  Name: Stacy Dalton MRN: [id number] Date of Birth: May 01, 1951

## 2016-10-10 NOTE — Progress Notes (Signed)
Subjective:    Patient ID: Stacy Dalton, female    DOB: 09-14-1950, 66 y.o.   MRN: [id number]  PT presents to the office today to discuss pain medication. Pt has been taking the Norco 10-325 mg, but states she feels this is "too strong". She reports constipation, nausea, and abdominal cramping. She reports increased crying and depression.  Back Pain  This is a chronic problem. The current episode started more than 1 year ago. The problem occurs intermittently. The problem has been waxing and waning since onset. The pain is present in the lumbar spine. The pain is at a severity of 7/10. The pain is moderate. The symptoms are aggravated by coughing and standing. Associated symptoms include abdominal pain.   Insomnia PT currently taking Ambien, but it was decreased from 12.5 mg to 6.25 mg. PT states this has not been helping since it was decreased.   [place] Controlled Substance Abuse database reviewed- Yes If yes- were their any concerning findings : Pt has only received controlled medications from me. Depression screen [facility] 2/9 10/10/2016 09/19/2016 09/05/2016 08/24/2016 08/10/2016  Decreased Interest 2 1 3 3 2   Down, Depressed, Hopeless 2 1 3 3 2   PHQ - 2 Score 4 2 6 6 4   Altered sleeping 2 1 2 2 2   Tired, decreased energy 2 1 3 3 2   Change in appetite 2 1 3 3 2   Feeling bad or failure about yourself  2 2 2 2  0  Trouble concentrating 1 1 2 2 1   Moving slowly or fidgety/restless 0 0 0 0 0  Suicidal thoughts 0 0 0 0 0  PHQ-9 Score 13 8 18 18 11   Difficult doing work/chores - Somewhat difficult - Very difficult -  Some recent data might be hidden    No flowsheet data found.     [facility] drug screen performed- Yes  SOAPP  0= never  1= seldom  2=sometimes  3= often  4= very often  How often do you have mood swings? 3 How often do you smoke a cigarette within an hour after waling up? 0 How often have you taken medication other than the way that it was prescribed?0 How  often have you used illegal drugs in the past 5 years? 0 How often, in your lifetime, have you had legal problems or been arrested? 0  Score 3  Alcohol Audit - How often during the last year have found that you: 0-Never   1- Less than monthly   2- Monthly     3-Weekly     4-daily or almost daily  - found that you were not able to stop drinking once you started- 0 -failed to do what was normally expected of you because of drinking- 0 -needed a first drink in the morning- 0 -had a feeling of guilt or remorse after drinking- 0 -are/were unable to remember what happened the night before because of your drinking- 0  0- NO   2- yes but not in last year  4- yes during last year -Have you or someone else been injured because of your drinking- 0 - Has anyone been concerned about your drinking or suggested you cut down- 0        TOTAL- 0  ( 0-7- alcohol education, 8-15- simple advice, 16-19 simple advice plus counseling, 20-40 referral for evaluation and treatment 0   [place]  Pain assessment: Cause of pain- Chronic low back pain Pain location- low back  Pain on scale of 1-10- 6-7 Frequency- Intermittent What increases pain- Standing and walking What makes pain Better-Pain medication  Prior treatments tried and failed- Norco 10-325 mg every TID prn, #90 Current treatments- Ultram 50 mg  Pain management agreement reviewed and signed- Yes   Review of Systems  Gastrointestinal: Positive for abdominal pain.  Musculoskeletal: Positive for back pain.  Psychiatric/Behavioral: Positive for behavioral problems and decreased concentration. The patient is nervous/anxious.   All other systems reviewed and are negative.      Objective:   Physical Exam  Constitutional: She is oriented to person, place, and time. She appears well-developed and well-nourished. No distress.  HENT:  Head: Normocephalic.  Eyes: Pupils are equal, round, and reactive to light.    Neck: Normal range of motion. Neck supple. No thyromegaly present.  Cardiovascular: Normal rate, regular rhythm, normal heart sounds and intact distal pulses.   No murmur heard. Pulmonary/Chest: Effort normal and breath sounds normal. No respiratory distress. She has no wheezes.  Abdominal: Soft. Bowel sounds are normal. She exhibits no distension. There is no tenderness.  Musculoskeletal: She exhibits tenderness. She exhibits no edema.  FULL ROM of back, negative SLR   Neurological: She is alert and oriented to person, place, and time.  Skin: Skin is warm and dry.  Psychiatric: She has a normal mood and affect. Her behavior is normal. Judgment and thought content normal.  Vitals reviewed.   BP 127/74   Pulse 93   Temp (!) 96.8 F (36 C) (Oral)   Ht 5\' 4"  (1.626 m)   Wt 114 lb (51.7 kg)   BMI 19.57 kg/m      Assessment & Plan:  1. Chronic bilateral thoracic back pai - traMADol (ULTRAM) 50 MG tablet; Take 1-2 tablets (50-100 mg total) by mouth every 12 (twelve) hours as needed.  Dispense: 120 tablet; Refill: 3 - ToxASSURE Select 13 (MW), Urine  2. Chronic pain syndrome - traMADol (ULTRAM) 50 MG tablet; Take 1-2 tablets (50-100 mg total) by mouth every 12 (twelve) hours as needed.  Dispense: 120 tablet; Refill: 3 - ToxASSURE Select 13 (MW), Urine  3. Pain medication agreement signed - traMADol (ULTRAM) 50 MG tablet; Take 1-2 tablets (50-100 mg total) by mouth every 12 (twelve) hours as needed.  Dispense: 120 tablet; Refill: 3 - ToxASSURE Select 13 (MW), Urine  4. Uncomplicated opioid dependence (HCC) - traMADol (ULTRAM) 50 MG tablet; Take 1-2 tablets (50-100 mg total) by mouth every 12 (twelve) hours as needed.  Dispense: 120 tablet; Refill: 3 - ToxASSURE Select 13 (MW), Urine  5. Insomnia, unspecified type -Stopped Ambien today and will try belsomra today Sleep ritual  - ToxASSURE Select 13 (MW), Urine - Suvorexant (BELSOMRA) 10 MG TABS; Take 10 mg by mouth at bedtime as  needed.  Dispense: 30 tablet; Refill: 3   Continue all meds Labs pending Health Maintenance reviewed Diet and exercise encouraged RTO 3 months   Evelina Dun, FNP

## 2016-10-11 ENCOUNTER — Telehealth: Payer: Self-pay | Admitting: Family

## 2016-10-11 MED ORDER — TRAZODONE HCL 50 MG PO TABS: 50.0000 mg | ORAL_TABLET | Freq: Every evening | ORAL | 3 refills | Status: DC | PRN

## 2016-10-11 NOTE — Telephone Encounter (Signed)
Trazodone Prescription sent to pharmacy  ° °

## 2016-10-11 NOTE — Telephone Encounter (Signed)
Patient aware.

## 2016-10-12 ENCOUNTER — Encounter: Payer: Self-pay | Admitting: Physical Therapy

## 2016-10-12 ENCOUNTER — Other Ambulatory Visit: Payer: Self-pay | Admitting: *Deleted

## 2016-10-12 ENCOUNTER — Ambulatory Visit: Payer: Medicare Other | Admitting: Physical Therapy

## 2016-10-12 DIAGNOSIS — M546 Pain in thoracic spine: Secondary | ICD-10-CM

## 2016-10-12 MED ORDER — DULOXETINE HCL 30 MG PO CPEP: 30.0000 mg | ORAL_CAPSULE | Freq: Every day | ORAL | 0 refills | Status: DC

## 2016-10-12 NOTE — Therapy (Signed)
[facility] [place], [place], [address] Phone: [phone]   Fax:  [phone]  Physical Therapy Treatment  Patient Details  Name: Stacy Dalton MRN: [id number] Date of Birth: February 05, 1951 Referring Provider: Evelina Dun.  Encounter Date: 10/12/2016      PT End of Session - 10/12/16 1432    Visit Number 9   Number of Visits 16   Date for PT Re-Evaluation 10/16/16   PT Start Time 1324   PT Stop Time 1518   PT Time Calculation (min) 44 min   Activity Tolerance Patient tolerated treatment well   Behavior During Therapy [facility] for tasks assessed/performed      Past Medical History:  Diagnosis Date  . Anxiety    panic attacks  . COPD (chronic obstructive pulmonary disease) ([place])    told has copd, no current inhaler use  . Depression   . GERD (gastroesophageal reflux disease)   . Hypertension   . Hypothyroidism   . Insomnia   . Migraine   . Migraine   . Osteopenia     Past Surgical History:  Procedure Laterality Date  . ABDOMINAL HYSTERECTOMY    . bladder tac    . COLON SURGERY    . COLOSTOMY CLOSURE  04/19/2012   Procedure: COLOSTOMY CLOSURE;  Surgeon: Adin Hector, MD;  Location: [facility];  Service: General;  Laterality: N/A;  Laparotomy, Resection and Closure of Colostomy  . COLOSTOMY TAKEDOWN N/A 04/12/2016   Procedure: [place] TAKEDOWN;  Surgeon: Johnathan Hausen, MD;  Location: [facility];  Service: General;  Laterality: N/A;  . LAPAROTOMY  10/04/2011, colostomy also   Procedure: EXPLORATORY LAPAROTOMY;  Surgeon: Adin Hector, MD;  [facility]: [facility];  Service: General;  Laterality: N/A;  left partial colectomy with colostomy  . LAPAROTOMY  04/19/2012   Procedure: EXPLORATORY LAPAROTOMY;  Surgeon: Adin Hector, MD;  [facility];  Service: General;  Laterality: N/A;  . LAPAROTOMY N/A 11/29/2015   Procedure: EXPLORATORY LAPAROTOMY; SUBTOTAL COLECTOMY WITH HARTMAN PROCEDURE AND END COLOSTOMY;  Surgeon:  Johnathan Hausen, MD;  [facility]: [facility];  Service: General;  Laterality: N/A;  . TUBAL LIGATION    . VENTRAL HERNIA REPAIR  04/19/2012   Procedure: HERNIA REPAIR VENTRAL ADULT;  Surgeon: Adin Hector, MD;  [facility]: [facility];  Service: General;  Laterality: N/A;    There were no vitals filed for this visit.      Subjective Assessment - 10/12/16 1432    Subjective Reports only a little bit of back ache today.   Pertinent History Abdominal surgeries.   Limitations Sitting;Standing   How long can you sit comfortably? 15-20 minutes.   How long can you stand comfortably? 10-15 minutes.   Patient Stated Goals Get out of pain.   Currently in Pain? Yes   Pain Score 1    Pain Location Back   Pain Orientation Mid   Pain Descriptors / Indicators Aching   Pain Type Chronic pain   Pain Onset More than a month ago            [facility] PT Assessment - 10/12/16 0001      Assessment   Medical Diagnosis Chronic bilateral thoracic pain.     Precautions   Precautions None     Restrictions   Weight Bearing Restrictions No                     [facility] Adult PT Treatment/Exercise - 10/12/16 0001  Shoulder Exercises: Supine   Other Supine Exercises B shoulder D2 strengthening with yellow theraband x20 reps each     Shoulder Exercises: Seated   External Rotation Strengthening;Both;20 reps;Theraband   Theraband Level (Shoulder External Rotation) Level 1 (Yellow)   Other Seated Exercises Chair extended side angle/chair yoga x10 reps bilaterally     Shoulder Exercises: Standing   Horizontal ABduction Strengthening;Both;20 reps   Horizontal ABduction Limitations Pink XTS   Flexion Strengthening;Both;20 reps;Weights   Shoulder Flexion Weight (lbs) 1   Row Strengthening;Both;20 reps   Row Limitations Pink XTS   Other Standing Exercises Over counter stretch x10 reps   Other Standing Exercises B shoulder scaption 1# x20 reps     Shoulder Exercises: ROM/Strengthening   UBE  (Upper Arm Bike) 90 RPM x5 min   Wall Pushups 20 reps     Modalities   Modalities Electrical Stimulation;Moist Heat     Moist Heat Therapy   Number Minutes Moist Heat 15 Minutes   Moist Heat Location Other (comment)  thoracic spine     Electrical Stimulation   Electrical Stimulation Location B thoracic paraspinals   Electrical Stimulation Action Pre-Mod   Electrical Stimulation Parameters 80-150 hz x15 min   Electrical Stimulation Goals Pain                  PT Short Term Goals - 08/17/16 1923      PT SHORT TERM GOAL #1   Title STG's=LTG's.           PT Long Term Goals - 10/12/16 1436      PT LONG TERM GOAL #1   Title Independent with a HEP.   Time 8   Period Weeks   Status Achieved     PT LONG TERM GOAL #2   Title Sit 30 minutes with pain not > 3/10.   Time 8   Period Weeks   Status On-going     PT LONG TERM GOAL #3   Title Stand 20 minutes with pain not > 3/10.   Time 8   Period Weeks   Status On-going     PT LONG TERM GOAL #4   Title Perform ADL's with pain not > 3/10.   Time 8   Period Weeks   Status On-going               Plan - 10/12/16 1518    Clinical Impression Statement Patient tolerated today's treatment well as the only complaint throughout treatment was fatigue from exercises. A seated yoga pose attempted today to improve thoracic mobility with no complaints. Weakness noted in patient's UEs throughout treatment. ADLs still painful and difficult for patient secondary to pain at times reaching 8-9/10 per patient report thus goals remain on-going. Normal modalities response noted following removal of the modalities.   Rehab Potential Excellent   PT Frequency 2x / week   PT Duration 8 weeks   PT Treatment/Interventions ADLs/Self Care Home Management;Electrical Stimulation;Ultrasound;Moist Heat;Therapeutic activities;Therapeutic exercise;Patient/family education;Manual techniques;Dry needling   PT Next Visit Plan Modalities and  STW/M to thoracic paraspinal musculature; Dry Needling; scapular strengthening.      Consulted and Agree with Plan of Care Patient      Patient will benefit from skilled therapeutic intervention in order to improve the following deficits and impairments:  Pain, Decreased activity tolerance  Visit Diagnosis: Pain in thoracic spine     Problem List Patient Active Problem List   Diagnosis Date Noted  . Pain medication agreement signed 10/10/2016  .  Opioid dependence ([place]) 10/10/2016  . Status post colostomy takedown 04/12/2016  . Chronic constipation 12/03/2015  . Peritonitis with abscess of intestine ([place]) 11/29/2015  . COPD (chronic obstructive pulmonary disease) ([place]) 09/20/2015  . Osteopenia 06/10/2014  . Vitamin D deficiency disease 06/10/2014  . Hypothyroidism   . Depression 11/18/2012  . Insomnia 11/18/2012  . Chronic pain syndrome 11/18/2012  . HLD (hyperlipidemia) 09/01/2012  . GAD (generalized anxiety disorder) 09/01/2012    Class: Chronic  . S/P colostomy ([place]) 04/19/2012  . Normocytic anemia 10/15/2011  . Hypertension 10/13/2011  . Chronic back pain 10/13/2011    Wynelle Fanny, PTA 10/12/2016, 3:27 PM  [facility] [address] [place], [place], [address] Phone: [phone]   Fax:  [phone]  Name: KATHEE TUMLIN MRN: [id number] Date of Birth: April 11, 1951

## 2016-10-13 LAB — TOXASSURE SELECT 13 (MW), URINE

## 2016-10-16 ENCOUNTER — Encounter: Payer: Medicare Other | Admitting: Physical Therapy

## 2016-10-17 ENCOUNTER — Ambulatory Visit: Payer: Medicare Other

## 2016-10-18 ENCOUNTER — Encounter: Payer: Medicare Other | Admitting: Physical Therapy

## 2016-10-20 ENCOUNTER — Telehealth: Payer: Self-pay | Admitting: Family

## 2016-10-20 ENCOUNTER — Ambulatory Visit: Payer: Medicare Other | Admitting: Physical Therapy

## 2016-10-20 DIAGNOSIS — M546 Pain in thoracic spine: Secondary | ICD-10-CM | POA: Diagnosis not present

## 2016-10-20 NOTE — Therapy (Signed)
[facility] [place], [place], [address] Phone: [phone]   Fax:  [phone]  Physical Therapy Treatment/Recertification  Patient Details  Name: Stacy Dalton MRN: [id number] Date of Birth: 05/19/1951 Referring Provider: Evelina Dun.  Encounter Date: 10/20/2016      PT End of Session - 10/20/16 1023    Visit Number 10   Number of Visits 18   Date for PT Re-Evaluation 11/17/16   PT Start Time 1000  pt arrived late   PT Stop Time 1035   PT Time Calculation (min) 35 min   Activity Tolerance Patient tolerated treatment well   Behavior During Therapy [facility] for tasks assessed/performed      Past Medical History:  Diagnosis Date  . Anxiety    panic attacks  . COPD (chronic obstructive pulmonary disease) ([place])    told has copd, no current inhaler use  . Depression   . GERD (gastroesophageal reflux disease)   . Hypertension   . Hypothyroidism   . Insomnia   . Migraine   . Migraine   . Osteopenia     Past Surgical History:  Procedure Laterality Date  . ABDOMINAL HYSTERECTOMY    . bladder tac    . COLON SURGERY    . COLOSTOMY CLOSURE  04/19/2012   Procedure: COLOSTOMY CLOSURE;  Surgeon: Adin Hector, MD;  [facility];  Service: General;  Laterality: N/A;  Laparotomy, Resection and Closure of Colostomy  . COLOSTOMY TAKEDOWN N/A 04/12/2016   Procedure: [place] TAKEDOWN;  Surgeon: Johnathan Hausen, MD;  [facility];  Service: General;  Laterality: N/A;  . LAPAROTOMY  10/04/2011, colostomy also   Procedure: EXPLORATORY LAPAROTOMY;  Surgeon: Adin Hector, MD;  [facility];  Service: General;  Laterality: N/A;  left partial colectomy with colostomy  . LAPAROTOMY  04/19/2012   Procedure: EXPLORATORY LAPAROTOMY;  Surgeon: Adin Hector, MD;  [facility];  Service: General;  Laterality: N/A;  . LAPAROTOMY N/A 11/29/2015   Procedure: EXPLORATORY LAPAROTOMY; SUBTOTAL COLECTOMY WITH HARTMAN  PROCEDURE AND END COLOSTOMY;  Surgeon: Johnathan Hausen, MD;  [facility];  Service: General;  Laterality: N/A;  . TUBAL LIGATION    . VENTRAL HERNIA REPAIR  04/19/2012   Procedure: HERNIA REPAIR VENTRAL ADULT;  Surgeon: Adin Hector, MD;  [facility];  Service: General;  Laterality: N/A;    There were no vitals filed for this visit.      Subjective Assessment - 10/20/16 1002    Subjective "at times I feel better."   Pertinent History Abdominal surgeries.   How long can you sit comfortably? 10-15 min (15-20 min previously); with sitting > 30 min pain up to 7-8/10   How long can you stand comfortably? 20 min (10-15 min previously) with pain up to 7-8/10   Patient Stated Goals Get out of pain.   Currently in Pain? Yes   Pain Score 2    Pain Location Back   Pain Orientation Mid   Pain Descriptors / Indicators Aching   Pain Type Chronic pain   Pain Onset More than a month ago   Pain Frequency Constant   Aggravating Factors  ADLs   Pain Relieving Factors at rest, medication   Effect of Pain on Daily Activities ADLs: with medication: 3-4/10; without medication: 8-9/10            [facility] PT Assessment - 10/20/16 1041      Observation/Other Assessments   Focus on Therapeutic Outcomes (  FOTO)  43 (57% limited)                     [facility] Adult PT Treatment/Exercise - Nov 04, 2016 1016      Shoulder Exercises: Seated   Retraction Both;20 reps     Shoulder Exercises: ROM/Strengthening   UBE (Upper Arm Bike) 90 RPM x6 min     Moist Heat Therapy   Number Minutes Moist Heat 15 Minutes   Moist Heat Location Other (comment)  thoracic spine     Electrical Stimulation   Electrical Stimulation Location B thoracic paraspinals   Electrical Stimulation Action IFC   Electrical Stimulation Parameters to tolerance x 15 min   Electrical Stimulation Goals Pain                  PT Short Term Goals - 08/17/16 1923      PT SHORT TERM GOAL #1   Title  STG's=LTG's.           PT Long Term Goals - 11-04-16 1024      PT LONG TERM GOAL #1   Title Independent with a HEP (11/17/16)   Status On-going     PT LONG TERM GOAL #2   Title Sit 30 minutes with pain not > 3/10.   Status On-going     PT LONG TERM GOAL #3   Title Stand 20 minutes with pain not > 3/10.   Status On-going     PT LONG TERM GOAL #4   Title Perform ADL's with pain not > 3/10.   Baseline met with medication, not met without medication   Status Partially Met               Plan - Nov 04, 2016 1024    Clinical Impression Statement Pt continues to report elevated pain with prolonged activities including sitting and standing. Pt with limited attendance with PT due to illness and other family matters.  Discussed need for compliant attendance to see maximial benefit from PT and pt agreed.  At this time recommend PT 2x/wk x 4 wks to continue towards goals.  If attendance is limited or no progress then recommend d/c at that time.     Rehab Potential Good   PT Frequency 2x / week   PT Duration 4 weeks   PT Treatment/Interventions ADLs/Self Care Home Management;Electrical Stimulation;Ultrasound;Moist Heat;Therapeutic activities;Therapeutic exercise;Patient/family education;Manual techniques;Dry needling   PT Next Visit Plan needs updated HEP, manual, modalities, scapular strengthening   Consulted and Agree with Plan of Care Patient      Patient will benefit from skilled therapeutic intervention in order to improve the following deficits and impairments:  Pain, Decreased activity tolerance  Visit Diagnosis: Pain in thoracic spine - Plan: PT plan of care cert/re-cert       G-Codes - 11/04/16 1042    Functional Assessment Tool Used (Outpatient Only) FOTO 57% limited   Functional Limitation Mobility: Walking and moving around   Mobility: Walking and Moving Around Current Status ([id number]) At least 40 percent but less than 60 percent impaired, limited or restricted    Mobility: Walking and Moving Around Goal Status [phone]) At least 20 percent but less than 40 percent impaired, limited or restricted      Problem List Patient Active Problem List   Diagnosis Date Noted  . Pain medication agreement signed 10/10/2016  . Opioid dependence ([place]) 10/10/2016  . Status post colostomy takedown 04/12/2016  . Chronic constipation 12/03/2015  . Peritonitis with abscess of intestine (  [place]) 11/29/2015  . COPD (chronic obstructive pulmonary disease) ([place]) 09/20/2015  . Osteopenia 06/10/2014  . Vitamin D deficiency disease 06/10/2014  . Hypothyroidism   . Depression 11/18/2012  . Insomnia 11/18/2012  . Chronic pain syndrome 11/18/2012  . HLD (hyperlipidemia) 09/01/2012  . GAD (generalized anxiety disorder) 09/01/2012    Class: Chronic  . S/P colostomy ([place]) 04/19/2012  . Normocytic anemia 10/15/2011  . Hypertension 10/13/2011  . Chronic back pain 10/13/2011      Laureen Abrahams, PT, DPT 10/20/16 10:44 AM    [facility] [address] [place], [place], [address] Phone: [phone]   Fax:  [phone]  Name: JASHAYLA GLATFELTER MRN: [id number] Date of Birth: [birth year]

## 2016-10-20 NOTE — Telephone Encounter (Signed)
Aware of results on tox assure.

## 2016-10-23 ENCOUNTER — Ambulatory Visit: Payer: Medicare Other | Admitting: Family

## 2016-10-24 ENCOUNTER — Ambulatory Visit: Payer: Medicare Other | Admitting: *Deleted

## 2016-10-24 DIAGNOSIS — M546 Pain in thoracic spine: Secondary | ICD-10-CM

## 2016-10-24 NOTE — Patient Instructions (Signed)
Isometric Abdominal   Lying on back with knees bent, tighten stomach by pulling navel down. Hold ____ seconds. Repeat __5__ times per set. Do __5__ sets per session. Do _3-5___ sessions per day.  http://orth.exer.us/1086   Copyright  VHI. All rights reserved.  Bent Leg Lift (Hook-Lying)   Tighten stomach and slowly raise right leg _6___ inches from floor. Keep trunk rigid. Hold _2-3___ seconds. Repeat _10___ times per set. Do _3___ sets per session. Do _2-3___ sessions per day.  http://orth.exer.us/1090   Copyright  VHI. All rights reserved.  Bridging   Slowly raise buttocks from floor, keeping stomach tight. Repeat __10__ times per set. Do _3___ sets per session. Do __2-3__ sessions per day.  http://orth.exer.us/1096   Copyright  VHI. All rights reserved.  Bridging    http://orth.exer.us/1096   Copyright  VHI. All rights reserved.  Straight Leg Raise (Prone)   Abdomen and head supported, keep left knee locked and raise leg at hip. Avoid arching low back. Repeat _10___ times per set. Do _3___ sets per session. Do _2-3___ sessions per day.  http://orth.exer.us/1112   Copyright  VHI. All rights reserved.  Opposite Arm / Leg Lift (Prone)   Abdomen and head supported, left knee locked, raise leg and opposite arm ____ inches from floor. Repeat _10___ times per set. Do 3____ sets per session. Do __2-3__ sessions per day.  http://orth.exer.us/1114   Copyright  VHI. All rights reserved.  Combination (Hook-Lying)   Tighten stomach and slowly raise left leg and lower opposite arm over head. Keep trunk rigid. Repeat _10___ times per set. Do _3___ sets per session. Do _2-3___ sessions per day.  http://orth.exer.us/1092   Copyright  VHI. All rights reserved.  Upper / Lower Extremity Extension (All-Fours)   Tighten stomach and raise right leg and opposite arm. Keep trunk rigid. Repeat __10__ times per set. Do __3__ sets per session. Do _2-3___ sessions per  day.  http://orth.exer.us/1118   Copyright  VHI. All rights reserved.

## 2016-10-24 NOTE — Therapy (Signed)
[facility] [place], [place], [address] Phone: [phone]   Fax:  [phone]  Physical Therapy Treatment  Patient Details  Name: Stacy Dalton MRN: [id number] Date of Birth: 1951-05-30 Referring Provider: Evelina Dun.  Encounter Date: 10/24/2016      PT End of Session - 10/24/16 1438    Visit Number 11   Number of Visits 18   Date for PT Re-Evaluation 11/17/16   PT Start Time 1430   PT Stop Time 1520   PT Time Calculation (min) 50 min      Past Medical History:  Diagnosis Date  . Anxiety    panic attacks  . COPD (chronic obstructive pulmonary disease) ([place])    told has copd, no current inhaler use  . Depression   . GERD (gastroesophageal reflux disease)   . Hypertension   . Hypothyroidism   . Insomnia   . Migraine   . Migraine   . Osteopenia     Past Surgical History:  Procedure Laterality Date  . ABDOMINAL HYSTERECTOMY    . bladder tac    . COLON SURGERY    . COLOSTOMY CLOSURE  04/19/2012   Procedure: COLOSTOMY CLOSURE;  Surgeon: Adin Hector, MD;  Location: [facility];  Service: General;  Laterality: N/A;  Laparotomy, Resection and Closure of Colostomy  . COLOSTOMY TAKEDOWN N/A 04/12/2016   Procedure: [place] TAKEDOWN;  Surgeon: Johnathan Hausen, MD;  [facility];  Service: General;  Laterality: N/A;  . LAPAROTOMY  10/04/2011, colostomy also   Procedure: EXPLORATORY LAPAROTOMY;  Surgeon: Adin Hector, MD;  [facility];  Service: General;  Laterality: N/A;  left partial colectomy with colostomy  . LAPAROTOMY  04/19/2012   Procedure: EXPLORATORY LAPAROTOMY;  Surgeon: Adin Hector, MD;  [facility];  Service: General;  Laterality: N/A;  . LAPAROTOMY N/A 11/29/2015   Procedure: EXPLORATORY LAPAROTOMY; SUBTOTAL COLECTOMY WITH HARTMAN PROCEDURE AND END COLOSTOMY;  Surgeon: Johnathan Hausen, MD;  [facility];  Service: General;  Laterality: N/A;  . TUBAL LIGATION    . VENTRAL  HERNIA REPAIR  04/19/2012   Procedure: HERNIA REPAIR VENTRAL ADULT;  Surgeon: Adin Hector, MD;  [facility];  Service: General;  Laterality: N/A;    There were no vitals filed for this visit.      Subjective Assessment - 10/24/16 1436    Subjective "at times I feel better."  4-5/10   Pertinent History Abdominal surgeries.   Limitations Sitting;Standing   How long can you sit comfortably? 10-15 min (15-20 min previously); with sitting > 30 min pain up to 7-8/10   How long can you stand comfortably? 20 min (10-15 min previously) with pain up to 7-8/10   Patient Stated Goals Get out of pain.   Currently in Pain? Yes   Pain Score 4    Pain Location Back   Pain Orientation Mid   Pain Descriptors / Indicators Aching   Pain Type Chronic pain   Pain Onset More than a month ago   Pain Frequency Constant                         [facility] Adult PT Treatment/Exercise - 10/24/16 0001      Lumbar Exercises: Supine   Ab Set 10 reps;5 seconds  Drawin   Bent Knee Raise 5 seconds;10 reps   Bridge 20 reps;3 seconds     Lumbar Exercises: Prone   Straight Leg Raise  10 reps;20 reps  3x10      Shoulder Exercises: Standing   Row Strengthening;Both  XTS 6X10 WITH DRAWIN     Shoulder Exercises: ROM/Strengthening   UBE (Upper Arm Bike) 90 RPM x6 min     Modalities   Modalities Electrical Stimulation;Moist Heat     Moist Heat Therapy   Number Minutes Moist Heat 15 Minutes   Moist Heat Location Other (comment)  thoracic spine     Electrical Stimulation   Electrical Stimulation Location B thoracic paraspinals IFC x 15 mins 80-150hz    Electrical Stimulation Goals Pain                PT Education - 10/24/16 1506    Education provided Yes   Education Details core activation   Person(s) Educated Patient   Methods Explanation;Demonstration;Tactile cues;Verbal cues;Handout   Comprehension Verbalized understanding;Returned demonstration          PT Short  Term Goals - 08/17/16 1923      PT SHORT TERM GOAL #1   Title STG's=LTG's.           PT Long Term Goals - 10/20/16 1024      PT LONG TERM GOAL #1   Title Independent with a HEP (11/17/16)   Status On-going     PT LONG TERM GOAL #2   Title Sit 30 minutes with pain not > 3/10.   Status On-going     PT LONG TERM GOAL #3   Title Stand 20 minutes with pain not > 3/10.   Status On-going     PT LONG TERM GOAL #4   Title Perform ADL's with pain not > 3/10.   Baseline met with medication, not met without medication   Status Partially Met               Plan - 10/24/16 1439    Clinical Impression Statement Pt arrived today doing about the same with pain levels rising with sitting and standing activities. We focused on core strengthening and scapular stability today and Pt was given handouts for HEP. We discussed importance of posture and core strengthening. Normal response to modalities   PT Frequency 2x / week   PT Duration 4 weeks   PT Treatment/Interventions ADLs/Self Care Home Management;Electrical Stimulation;Ultrasound;Moist Heat;Therapeutic activities;Therapeutic exercise;Patient/family education;Manual techniques;Dry needling   PT Next Visit Plan needs updated HEP, manual, modalities, scapular strengthening   Consulted and Agree with Plan of Care Patient      Patient will benefit from skilled therapeutic intervention in order to improve the following deficits and impairments:  Pain, Decreased activity tolerance  Visit Diagnosis: Pain in thoracic spine     Problem List Patient Active Problem List   Diagnosis Date Noted  . Pain medication agreement signed 10/10/2016  . Opioid dependence ([place]) 10/10/2016  . Status post colostomy takedown 04/12/2016  . Chronic constipation 12/03/2015  . Peritonitis with abscess of intestine ([place]) 11/29/2015  . COPD (chronic obstructive pulmonary disease) ([place]) 09/20/2015  . Osteopenia 06/10/2014  . Vitamin D deficiency  disease 06/10/2014  . Hypothyroidism   . Depression 11/18/2012  . Insomnia 11/18/2012  . Chronic pain syndrome 11/18/2012  . HLD (hyperlipidemia) 09/01/2012  . GAD (generalized anxiety disorder) 09/01/2012    Class: Chronic  . S/P colostomy ([place]) 04/19/2012  . Normocytic anemia 10/15/2011  . Hypertension 10/13/2011  . Chronic back pain 10/13/2011    Mayzee Reichenbach,CHRIS, PTA 10/24/2016, 3:34 PM  [facility] [address] [place], [place], [address]  Phone: [phone]   Fax:  [phone]  Name: Stacy Dalton MRN: [id number] Date of Birth: Feb 13, 1951

## 2016-10-26 ENCOUNTER — Ambulatory Visit: Payer: Medicare Other | Admitting: Physical Therapy

## 2016-10-26 DIAGNOSIS — M546 Pain in thoracic spine: Secondary | ICD-10-CM | POA: Diagnosis not present

## 2016-10-26 NOTE — Therapy (Signed)
[facility] [place], [place], [address] Phone: [phone]   Fax:  [phone]  Physical Therapy Treatment  Patient Details  Name: Stacy Dalton MRN: [id number] Date of Birth: 28-Jun-1951 Referring Provider: Evelina Dun.  Encounter Date: 10/26/2016      PT End of Session - 10/26/16 1622    Visit Number 12   Number of Visits 18   Date for PT Re-Evaluation 11/17/16   PT Start Time 0324   PT Stop Time 0405  Late arrival.   PT Time Calculation (min) 41 min   Activity Tolerance Patient tolerated treatment well   Behavior During Therapy [facility] for tasks assessed/performed      Past Medical History:  Diagnosis Date  . Anxiety    panic attacks  . COPD (chronic obstructive pulmonary disease) ([place])    told has copd, no current inhaler use  . Depression   . GERD (gastroesophageal reflux disease)   . Hypertension   . Hypothyroidism   . Insomnia   . Migraine   . Migraine   . Osteopenia     Past Surgical History:  Procedure Laterality Date  . ABDOMINAL HYSTERECTOMY    . bladder tac    . COLON SURGERY    . COLOSTOMY CLOSURE  04/19/2012   Procedure: COLOSTOMY CLOSURE;  Surgeon: Adin Hector, MD;  [facility];  Service: General;  Laterality: N/A;  Laparotomy, Resection and Closure of Colostomy  . COLOSTOMY TAKEDOWN N/A 04/12/2016   Procedure: [place] TAKEDOWN;  Surgeon: Johnathan Hausen, MD;  [facility];  Service: General;  Laterality: N/A;  . LAPAROTOMY  10/04/2011, colostomy also   Procedure: EXPLORATORY LAPAROTOMY;  Surgeon: Adin Hector, MD;  [facility];  Service: General;  Laterality: N/A;  left partial colectomy with colostomy  . LAPAROTOMY  04/19/2012   Procedure: EXPLORATORY LAPAROTOMY;  Surgeon: Adin Hector, MD;  [facility];  Service: General;  Laterality: N/A;  . LAPAROTOMY N/A 11/29/2015   Procedure: EXPLORATORY LAPAROTOMY; SUBTOTAL COLECTOMY WITH HARTMAN PROCEDURE AND END  COLOSTOMY;  Surgeon: Johnathan Hausen, MD;  [facility];  Service: General;  Laterality: N/A;  . TUBAL LIGATION    . VENTRAL HERNIA REPAIR  04/19/2012   Procedure: HERNIA REPAIR VENTRAL ADULT;  Surgeon: Adin Hector, MD;  [facility];  Service: General;  Laterality: N/A;    There were no vitals filed for this visit.      Subjective Assessment - 10/26/16 1623    Subjective I had my heat warmers on in my car to make my back feel better.   Pain Score 4    Pain Location Back   Pain Orientation Mid   Pain Descriptors / Indicators Aching   Pain Onset More than a month ago                         [facility] Adult PT Treatment/Exercise - 10/26/16 0001      Electrical Stimulation   Electrical Stimulation Location Bilateral thoracic region.   Electrical Stimulation Action IFC   Electrical Stimulation Parameters 80-150 Hz x 20 minutes.   Electrical Stimulation Goals Pain     Manual Therapy   Manual Therapy Joint mobilization;Soft tissue mobilization   Soft tissue mobilization In prone:  STW/M to affected thoracic and upper lumbar region including gentle PA mobs and costovertebral mobs to thoracic spine x 15 minutes.  PT Short Term Goals - 08/17/16 1923      PT SHORT TERM GOAL #1   Title STG's=LTG's.           PT Long Term Goals - 10/20/16 1024      PT LONG TERM GOAL #1   Title Independent with a HEP (11/17/16)   Status On-going     PT LONG TERM GOAL #2   Title Sit 30 minutes with pain not > 3/10.   Status On-going     PT LONG TERM GOAL #3   Title Stand 20 minutes with pain not > 3/10.   Status On-going     PT LONG TERM GOAL #4   Title Perform ADL's with pain not > 3/10.   Baseline met with medication, not met without medication   Status Partially Met               Plan - 10/26/16 1626    Clinical Impression Statement Patient reporting very little pain post-tx.      Patient will benefit from skilled  therapeutic intervention in order to improve the following deficits and impairments:  Pain, Decreased activity tolerance  Visit Diagnosis: Pain in thoracic spine     Problem List Patient Active Problem List   Diagnosis Date Noted  . Pain medication agreement signed 10/10/2016  . Opioid dependence ([place]) 10/10/2016  . Status post colostomy takedown 04/12/2016  . Chronic constipation 12/03/2015  . Peritonitis with abscess of intestine ([place]) 11/29/2015  . COPD (chronic obstructive pulmonary disease) ([place]) 09/20/2015  . Osteopenia 06/10/2014  . Vitamin D deficiency disease 06/10/2014  . Hypothyroidism   . Depression 11/18/2012  . Insomnia 11/18/2012  . Chronic pain syndrome 11/18/2012  . HLD (hyperlipidemia) 09/01/2012  . GAD (generalized anxiety disorder) 09/01/2012    Class: Chronic  . S/P colostomy ([place]) 04/19/2012  . Normocytic anemia 10/15/2011  . Hypertension 10/13/2011  . Chronic back pain 10/13/2011    Julious Langlois, [place] MPT 10/26/2016, 4:27 PM  [facility] [place], [place], [address] Phone: [phone]   Fax:  [phone]  Name: Stacy Dalton MRN: [id number] Date of Birth: Aug 28, 1950

## 2016-10-27 ENCOUNTER — Telehealth: Payer: Self-pay | Admitting: Family

## 2016-10-27 MED ORDER — ONDANSETRON HCL 4 MG PO TABS: 4.0000 mg | ORAL_TABLET | Freq: Three times a day (TID) | ORAL | 0 refills | Status: DC | PRN

## 2016-10-27 NOTE — Telephone Encounter (Signed)
Patient aware.

## 2016-10-27 NOTE — Telephone Encounter (Signed)
What symptoms do you have? Nausea everyday  How long have you been sick? All week since she started taking the ABX. Wants anti nasuea medicine. Zofran  Have you been seen for this problem? YES  If your provider decides to give you a prescription, which pharmacy would you like for it to be sent to? [facility]   Patient informed that this information will be sent to the clinical staff for review and that they should receive a follow up call.

## 2016-10-27 NOTE — Telephone Encounter (Signed)
Zofran Prescription sent to pharmacy  ° °

## 2016-10-31 ENCOUNTER — Ambulatory Visit: Payer: Medicare Other | Admitting: Physical Therapy

## 2016-10-31 ENCOUNTER — Encounter: Payer: Self-pay | Admitting: Family

## 2016-10-31 ENCOUNTER — Ambulatory Visit (INDEPENDENT_AMBULATORY_CARE_PROVIDER_SITE_OTHER): Payer: Medicare Other | Admitting: Family

## 2016-10-31 VITALS — BP 145/70 | HR 91 | Temp 97.5°F | Ht 64.0 in | Wt 115.8 lb

## 2016-10-31 DIAGNOSIS — R531 Weakness: Secondary | ICD-10-CM

## 2016-10-31 DIAGNOSIS — G4700 Insomnia, unspecified: Secondary | ICD-10-CM | POA: Diagnosis not present

## 2016-10-31 DIAGNOSIS — R399 Unspecified symptoms and signs involving the genitourinary system: Secondary | ICD-10-CM | POA: Diagnosis not present

## 2016-10-31 DIAGNOSIS — R110 Nausea: Secondary | ICD-10-CM | POA: Diagnosis not present

## 2016-10-31 LAB — MICROSCOPIC EXAMINATION

## 2016-10-31 LAB — URINALYSIS, COMPLETE
Bilirubin, UA: NEGATIVE
Glucose, UA: NEGATIVE
Ketones, UA: NEGATIVE
Nitrite, UA: NEGATIVE
Protein, UA: NEGATIVE
RBC, UA: NEGATIVE
Specific Gravity, UA: 1.01 (ref 1.005–1.030)
Urobilinogen, Ur: 0.2 mg/dL (ref 0.2–1.0)
pH, UA: 7 (ref 5.0–7.5)

## 2016-10-31 MED ORDER — TEMAZEPAM 15 MG PO CAPS: 15.0000 mg | ORAL_CAPSULE | Freq: Every evening | ORAL | 1 refills | Status: DC | PRN

## 2016-10-31 MED ORDER — CIPROFLOXACIN HCL 500 MG PO TABS: 500.0000 mg | ORAL_TABLET | Freq: Two times a day (BID) | ORAL | 0 refills | Status: DC

## 2016-10-31 NOTE — Progress Notes (Addendum)
   Subjective:    Patient ID: Stacy Dalton, female    DOB: 1951/05/14, 66 y.o.   MRN: [id number]  Pt presents to the office today after a syncope episode yesterday. PT states she call EMS and was told she was dehydrated and pt reports that she has not been eating or drinking much other the last few days. She reports feeling nauseous, weak, and dizziness. PT states she was having some UTI symptoms last week and had a bactrim rx at home and has been taking it on the days that she hasn't been sick. PT states this has helped her dysuria slightly.  Dizziness  Associated symptoms include fatigue, headaches and weakness.  Loss of Consciousness  This is a new problem. The current episode started yesterday. She lost consciousness for a period of less than 1 minute. The symptoms are aggravated by sitting up. Associated symptoms include back pain, dizziness, headaches, light-headedness, malaise/fatigue and weakness. Pertinent negatives include no bladder incontinence or bowel incontinence. She has tried drinking and bed rest for the symptoms. The treatment provided mild relief.  Insomnia  PT states her trasdone is not working. Would like to try something new.    Review of Systems  Constitutional: Positive for fatigue and malaise/fatigue.  Cardiovascular: Positive for syncope.  Gastrointestinal: Negative for bowel incontinence.  Genitourinary: Positive for dysuria and frequency. Negative for bladder incontinence.  Musculoskeletal: Positive for back pain.  Neurological: Positive for dizziness, weakness, light-headedness and headaches.       Objective:   Physical Exam  Constitutional: She is oriented to person, place, and time. She appears well-developed and well-nourished. No distress.  HENT:  Head: Normocephalic.  Eyes: Pupils are equal, round, and reactive to light.  Neck: Normal range of motion. Neck supple. No thyromegaly present.  Cardiovascular: Normal rate, regular rhythm, normal heart  sounds and intact distal pulses.   No murmur heard. Pulmonary/Chest: Effort normal and breath sounds normal. No respiratory distress. She has no wheezes.  Abdominal: Soft. Bowel sounds are normal. She exhibits no distension. There is no tenderness.  Musculoskeletal: Normal range of motion. She exhibits no edema or tenderness.  Neurological: She is alert and oriented to person, place, and time.  Skin: Skin is warm and dry.  Psychiatric: She has a normal mood and affect. Her behavior is normal. Judgment and thought content normal.  Vitals reviewed.   BP (!) 145/70   Pulse 91   Temp 97.5 F (36.4 C) (Oral)   Ht '5\' 4"'$  (1.626 m)   Wt 115 lb 12.8 oz (52.5 kg)   BMI 19.88 kg/m       Assessment & Plan:  1. Weakness - BMP8+EGFR - CBC with Differential/Platelet  2. UTI symptoms Force fluids AZO over the counter X2 days RTO prn Culture pending - BMP8+EGFR - CBC with Differential/Platelet - Urinalysis, Complete - Urine culture - ciprofloxacin (CIPRO) 500 MG tablet; Take 1 tablet (500 mg total) by mouth 2 (two) times daily.  Dispense: 10 tablet; Refill: 0  3. Nausea - BMP8+EGFR - CBC with Differential/Platelet  4. Insomnia, unspecified type Sleep ritual  Trazodone stopped and Restoril started today - temazepam (RESTORIL) 15 MG capsule; Take 1 capsule (15 mg total) by mouth at bedtime as needed for sleep.  Dispense: 30 capsule; Refill: [place], FNP

## 2016-10-31 NOTE — Patient Instructions (Signed)

## 2016-10-31 NOTE — Addendum Note (Signed)
Addended by: Evelina Dun A on: 10/31/2016 02:54 PM   Modules accepted: Orders

## 2016-11-01 LAB — BMP8+EGFR
BUN/Creatinine Ratio: 20 (ref 12–28)
BUN: 28 mg/dL — ABNORMAL HIGH (ref 8–27)
CO2: 22 mmol/L (ref 18–29)
Calcium: 9.9 mg/dL (ref 8.7–10.3)
Chloride: 94 mmol/L — ABNORMAL LOW (ref 96–106)
Creatinine, Ser: 1.41 mg/dL — ABNORMAL HIGH (ref 0.57–1.00)
GFR calc Af Amer: 44 mL/min/{1.73_m2} — ABNORMAL LOW (ref >59)
GFR calc non Af Amer: 38 mL/min/{1.73_m2} — ABNORMAL LOW (ref >59)
Glucose: 100 mg/dL — ABNORMAL HIGH (ref 65–99)
Potassium: 5.2 mmol/L (ref 3.5–5.2)
Sodium: 137 mmol/L (ref 134–144)

## 2016-11-01 LAB — CBC WITH DIFFERENTIAL/PLATELET
Basophils Absolute: 0 10*3/uL (ref 0.0–0.2)
Basos: 0 %
EOS (ABSOLUTE): 0 10*3/uL (ref 0.0–0.4)
Eos: 0 %
Hematocrit: 31.8 % — ABNORMAL LOW (ref 34.0–46.6)
Hemoglobin: 9.6 g/dL — ABNORMAL LOW (ref 11.1–15.9)
Immature Grans (Abs): 0 10*3/uL (ref 0.0–0.1)
Immature Granulocytes: 0 %
Lymphocytes Absolute: 2 10*3/uL (ref 0.7–3.1)
Lymphs: 21 %
MCH: 21.7 pg — ABNORMAL LOW (ref 26.6–33.0)
MCHC: 30.2 g/dL — ABNORMAL LOW (ref 31.5–35.7)
MCV: 72 fL — ABNORMAL LOW (ref 79–97)
Monocytes Absolute: 0.8 10*3/uL (ref 0.1–0.9)
Monocytes: 8 %
Neutrophils Absolute: 6.8 10*3/uL (ref 1.4–7.0)
Neutrophils: 71 %
Platelets: 405 10*3/uL — ABNORMAL HIGH (ref 150–379)
RBC: 4.43 x10E6/uL (ref 3.77–5.28)
RDW: 17.1 % — ABNORMAL HIGH (ref 12.3–15.4)
WBC: 9.6 10*3/uL (ref 3.4–10.8)

## 2016-11-02 ENCOUNTER — Ambulatory Visit: Payer: Medicare Other | Attending: Family | Admitting: *Deleted

## 2016-11-02 DIAGNOSIS — M546 Pain in thoracic spine: Secondary | ICD-10-CM | POA: Diagnosis not present

## 2016-11-02 LAB — URINE CULTURE

## 2016-11-02 NOTE — Therapy (Signed)
[facility] [place], [place], [address] Phone: [phone]   Fax:  [phone]  Physical Therapy Treatment  Patient Details  Name: Stacy Dalton MRN: [id number] Date of Birth: 1950/12/08 Referring Provider: Evelina Dun.  Encounter Date: 11/02/2016      PT End of Session - 11/02/16 1532    Visit Number 13   Number of Visits 18   Date for PT Re-Evaluation 11/17/16   PT Start Time 3343   PT Stop Time 1530   PT Time Calculation (min) 52 min      Past Medical History:  Diagnosis Date  . Anxiety    panic attacks  . COPD (chronic obstructive pulmonary disease) ([place])    told has copd, no current inhaler use  . Depression   . GERD (gastroesophageal reflux disease)   . Hypertension   . Hypothyroidism   . Insomnia   . Migraine   . Migraine   . Osteopenia     Past Surgical History:  Procedure Laterality Date  . ABDOMINAL HYSTERECTOMY    . bladder tac    . COLON SURGERY    . COLOSTOMY CLOSURE  04/19/2012   Procedure: COLOSTOMY CLOSURE;  Surgeon: Adin Hector, MD;  [facility];  Service: General;  Laterality: N/A;  Laparotomy, Resection and Closure of Colostomy  . COLOSTOMY TAKEDOWN N/A 04/12/2016   Procedure: [place] TAKEDOWN;  Surgeon: Johnathan Hausen, MD;  [facility];  Service: General;  Laterality: N/A;  . LAPAROTOMY  10/04/2011, colostomy also   Procedure: EXPLORATORY LAPAROTOMY;  Surgeon: Adin Hector, MD;  [facility];  Service: General;  Laterality: N/A;  left partial colectomy with colostomy  . LAPAROTOMY  04/19/2012   Procedure: EXPLORATORY LAPAROTOMY;  Surgeon: Adin Hector, MD;  [facility];  Service: General;  Laterality: N/A;  . LAPAROTOMY N/A 11/29/2015   Procedure: EXPLORATORY LAPAROTOMY; SUBTOTAL COLECTOMY WITH HARTMAN PROCEDURE AND END COLOSTOMY;  Surgeon: Johnathan Hausen, MD;  [facility];  Service: General;  Laterality: N/A;  . TUBAL LIGATION    . VENTRAL  HERNIA REPAIR  04/19/2012   Procedure: HERNIA REPAIR VENTRAL ADULT;  Surgeon: Adin Hector, MD;  [facility];  Service: General;  Laterality: N/A;    There were no vitals filed for this visit.      Subjective Assessment - 11/02/16 1437    Subjective I fell at home on Monday from passing out , but am ok. I had blood work done and my hemoglobin was low and have a severe UTI. I feel weak and a  little nauseous today.   Pertinent History Abdominal surgeries.   Limitations Sitting;Standing   How long can you sit comfortably? 10-15 min (15-20 min previously); with sitting > 30 min pain up to 7-8/10   How long can you stand comfortably? 20 min (10-15 min previously) with pain up to 7-8/10   Currently in Pain? Yes   Pain Score 4    Pain Location Back   Pain Orientation Mid                         [facility] Adult PT Treatment/Exercise - 11/02/16 0001      Modalities   Modalities Electrical Stimulation;Moist Heat;Ultrasound     Moist Heat Therapy   Number Minutes Moist Heat 15 Minutes   Moist Heat Location Other (comment)  thoracic spine     Electrical Stimulation   Electrical Stimulation Location B  thoracic paraspinals IFC x 15 mins 80-150hz    Electrical Stimulation Goals Pain     Ultrasound   Ultrasound Location Thoracic paras   Ultrasound Parameters 1.5 w/cm2 x 10 mins  prone   Ultrasound Goals Pain     Manual Therapy   Manual Therapy Soft tissue mobilization   Soft tissue mobilization In prone:  STW/ IASTM to affected thoracic and upper lumbar region in prone position                  PT Short Term Goals - 08/17/16 1923      PT SHORT TERM GOAL #1   Title STG's=LTG's.           PT Long Term Goals - 10/20/16 1024      PT LONG TERM GOAL #1   Title Independent with a HEP (11/17/16)   Status On-going     PT LONG TERM GOAL #2   Title Sit 30 minutes with pain not > 3/10.   Status On-going     PT LONG TERM GOAL #3   Title Stand 20  minutes with pain not > 3/10.   Status On-going     PT LONG TERM GOAL #4   Title Perform ADL's with pain not > 3/10.   Baseline met with medication, not met without medication   Status Partially Met               Plan - 11/02/16 1525    Clinical Impression Statement Pt arrived to clinic today not feeling well and reports that she fell at home on Monday from passing out. She has a severe UTI and blood work revealed low hemoglobin that they think cause dher to pass out. Pt also felt nauseous today so no Exs were performed. She had 4-5/10 mid-back pain prior to rxbut was 2-3/10 after. She was sore with STW and taut thoracic paras were noted. No LTGs met today due to current condition.   Rehab Potential Good   PT Frequency 2x / week   PT Duration 4 weeks   PT Treatment/Interventions ADLs/Self Care Home Management;Electrical Stimulation;Ultrasound;Moist Heat;Therapeutic activities;Therapeutic exercise;Patient/family education;Manual techniques;Dry needling   PT Next Visit Plan needs updated HEP, manual, modalities, scapular strengthening   Consulted and Agree with Plan of Care Patient      Patient will benefit from skilled therapeutic intervention in order to improve the following deficits and impairments:  Pain, Decreased activity tolerance  Visit Diagnosis: Pain in thoracic spine     Problem List Patient Active Problem List   Diagnosis Date Noted  . Pain medication agreement signed 10/10/2016  . Opioid dependence ([place]) 10/10/2016  . Status post colostomy takedown 04/12/2016  . Chronic constipation 12/03/2015  . Peritonitis with abscess of intestine ([place]) 11/29/2015  . COPD (chronic obstructive pulmonary disease) ([place]) 09/20/2015  . Osteopenia 06/10/2014  . Vitamin D deficiency disease 06/10/2014  . Hypothyroidism   . Depression 11/18/2012  . Insomnia 11/18/2012  . Chronic pain syndrome 11/18/2012  . HLD (hyperlipidemia) 09/01/2012  . GAD (generalized anxiety disorder)  09/01/2012    Class: Chronic  . S/P colostomy ([place]) 04/19/2012  . Normocytic anemia 10/15/2011  . Hypertension 10/13/2011  . Chronic back pain 10/13/2011    Neetu Carrozza,CHRIS, PTA 11/02/2016, 3:36 PM  [facility] [place], [place], [address] Phone: [phone]   Fax:  [phone]  Name: Stacy Dalton MRN: [id number] Date of Birth: 25-Feb-1951

## 2016-11-03 ENCOUNTER — Encounter: Payer: Self-pay | Admitting: Physical Therapy

## 2016-11-07 ENCOUNTER — Ambulatory Visit: Payer: Medicare Other | Admitting: *Deleted

## 2016-11-07 DIAGNOSIS — M546 Pain in thoracic spine: Secondary | ICD-10-CM | POA: Diagnosis not present

## 2016-11-08 DIAGNOSIS — Z9049 Acquired absence of other specified parts of digestive tract: Secondary | ICD-10-CM | POA: Diagnosis not present

## 2016-11-08 NOTE — Therapy (Signed)
[facility] [place], [place], [address] Phone: [phone]   Fax:  [phone]  Physical Therapy Treatment  Patient Details  Name: Stacy Dalton MRN: [id number] Date of Birth: 07-17-1950 Referring Provider: Evelina Dun.  Encounter Date: 11/07/2016      PT End of Session - 11/07/16 1605    Visit Number 14   Number of Visits 18   Date for PT Re-Evaluation 11/17/16   PT Start Time 1430   PT Stop Time 1524   PT Time Calculation (min) 54 min      Past Medical History:  Diagnosis Date  . Anxiety    panic attacks  . COPD (chronic obstructive pulmonary disease) ([place])    told has copd, no current inhaler use  . Depression   . GERD (gastroesophageal reflux disease)   . Hypertension   . Hypothyroidism   . Insomnia   . Migraine   . Migraine   . Osteopenia     Past Surgical History:  Procedure Laterality Date  . ABDOMINAL HYSTERECTOMY    . bladder tac    . COLON SURGERY    . COLOSTOMY CLOSURE  04/19/2012   Procedure: COLOSTOMY CLOSURE;  Surgeon: Adin Hector, MD;  Location: [facility];  Service: General;  Laterality: N/A;  Laparotomy, Resection and Closure of Colostomy  . COLOSTOMY TAKEDOWN N/A 04/12/2016   Procedure: [place] TAKEDOWN;  Surgeon: Johnathan Hausen, MD;  Location: [facility];  Service: General;  Laterality: N/A;  . LAPAROTOMY  10/04/2011, colostomy also   Procedure: EXPLORATORY LAPAROTOMY;  Surgeon: Adin Hector, MD;  Location: [facility];  Service: General;  Laterality: N/A;  left partial colectomy with colostomy  . LAPAROTOMY  04/19/2012   Procedure: EXPLORATORY LAPAROTOMY;  Surgeon: Adin Hector, MD;  Location: [facility];  Service: General;  Laterality: N/A;  . LAPAROTOMY N/A 11/29/2015   Procedure: EXPLORATORY LAPAROTOMY; SUBTOTAL COLECTOMY WITH HARTMAN PROCEDURE AND END COLOSTOMY;  Surgeon: Johnathan Hausen, MD;  Location: [facility];  Service: General;  Laterality: N/A;  . TUBAL LIGATION    . VENTRAL  HERNIA REPAIR  04/19/2012   Procedure: HERNIA REPAIR VENTRAL ADULT;  Surgeon: Adin Hector, MD;  Location: [facility];  Service: General;  Laterality: N/A;    There were no vitals filed for this visit.      Subjective Assessment - 11/07/16 1432    Subjective Feeling better, but not 100% yet   Pertinent History Abdominal surgeries.   Limitations Sitting;Standing   How long can you sit comfortably? 10-15 min (15-20 min previously); with sitting > 30 min pain up to 7-8/10   How long can you stand comfortably? 20 min (10-15 min previously) with pain up to 7-8/10   Patient Stated Goals Get out of pain.   Currently in Pain? Yes   Pain Score 2    Pain Location Back   Pain Orientation Mid   Pain Descriptors / Indicators Aching   Pain Type Chronic pain   Pain Onset More than a month ago   Pain Frequency Constant      Please see flowsheet for RX                             PT Short Term Goals - 08/17/16 1923      PT SHORT TERM GOAL #1   Title STG's=LTG's.           PT Long Term Goals - 10/20/16  1024      PT LONG TERM GOAL #1   Title Independent with a HEP (11/17/16)   Status On-going     PT LONG TERM GOAL #2   Title Sit 30 minutes with pain not > 3/10.   Status On-going     PT LONG TERM GOAL #3   Title Stand 20 minutes with pain not > 3/10.   Status On-going     PT LONG TERM GOAL #4   Title Perform ADL's with pain not > 3/10.   Baseline met with medication, not met without medication   Status Partially Met               Plan - 11/07/16 1609    Clinical Impression Statement Pt arrived today doing a little better and was able to perform postural exs with minimal increase in pain. She  still had tightness and soreness in Thoracolumbar paras found during STW. She responded well to [place] and STW with decreased pain after Rx.   Rehab Potential Good   PT Frequency 2x / week   PT Duration 4 weeks   PT Treatment/Interventions ADLs/Self Care  Home Management;Electrical Stimulation;Ultrasound;Moist Heat;Therapeutic activities;Therapeutic exercise;Patient/family education;Manual techniques;Dry needling   PT Next Visit Plan needs updated HEP, manual, modalities, scapular strengthening   Consulted and Agree with Plan of Care Patient      Patient will benefit from skilled therapeutic intervention in order to improve the following deficits and impairments:  Pain, Decreased activity tolerance  Visit Diagnosis: Pain in thoracic spine     Problem List Patient Active Problem List   Diagnosis Date Noted  . Pain medication agreement signed 10/10/2016  . Opioid dependence ([place]) 10/10/2016  . Status post colostomy takedown 04/12/2016  . Chronic constipation 12/03/2015  . Peritonitis with abscess of intestine ([place]) 11/29/2015  . COPD (chronic obstructive pulmonary disease) ([place]) 09/20/2015  . Osteopenia 06/10/2014  . Vitamin D deficiency disease 06/10/2014  . Hypothyroidism   . Depression 11/18/2012  . Insomnia 11/18/2012  . Chronic pain syndrome 11/18/2012  . HLD (hyperlipidemia) 09/01/2012  . GAD (generalized anxiety disorder) 09/01/2012    Class: Chronic  . S/P colostomy ([place]) 04/19/2012  . Normocytic anemia 10/15/2011  . Hypertension 10/13/2011  . Chronic back pain 10/13/2011    Dore Oquin,CHRIS, PTA 11/08/2016, 12:18 PM  [facility] [address] [place], [place], [address] Phone: [phone]   Fax:  [phone]  Name: Stacy Dalton MRN: [id number] Date of Birth: May 02, 1951

## 2016-11-09 ENCOUNTER — Ambulatory Visit: Payer: Medicare Other | Admitting: Physical Therapy

## 2016-11-09 ENCOUNTER — Encounter: Payer: Self-pay | Admitting: Physical Therapy

## 2016-11-09 DIAGNOSIS — M546 Pain in thoracic spine: Secondary | ICD-10-CM | POA: Diagnosis not present

## 2016-11-09 NOTE — Therapy (Addendum)
[facility] [place], [place], [address] Phone: [phone]   Fax:  [phone]  Physical Therapy Treatment  Patient Details  Name: Stacy Dalton MRN: [id number] Date of Birth: [birth year] Referring Provider: Evelina Dun.  Encounter Date: 11/09/2016      PT End of Session - 11/09/16 1649    Visit Number 15   Number of Visits 18   Date for PT Re-Evaluation 11/17/16   PT Start Time 3005   PT Stop Time 1736   PT Time Calculation (min) 47 min   Activity Tolerance Patient tolerated treatment well   Behavior During Therapy [facility] for tasks assessed/performed      Past Medical History:  Diagnosis Date  . Anxiety    panic attacks  . COPD (chronic obstructive pulmonary disease) ([place])    told has copd, no current inhaler use  . Depression   . GERD (gastroesophageal reflux disease)   . Hypertension   . Hypothyroidism   . Insomnia   . Migraine   . Migraine   . Osteopenia     Past Surgical History:  Procedure Laterality Date  . ABDOMINAL HYSTERECTOMY    . bladder tac    . COLON SURGERY    . COLOSTOMY CLOSURE  04/19/2012   Procedure: COLOSTOMY CLOSURE;  Surgeon: Adin Hector, MD;  Location: [facility];  Service: General;  Laterality: N/A;  Laparotomy, Resection and Closure of Colostomy  . COLOSTOMY TAKEDOWN N/A 04/12/2016   Procedure: [place] TAKEDOWN;  Surgeon: Johnathan Hausen, MD;  Location: [facility];  Service: General;  Laterality: N/A;  . LAPAROTOMY  10/04/2011, colostomy also   Procedure: EXPLORATORY LAPAROTOMY;  Surgeon: Adin Hector, MD;  Location: [facility];  Service: General;  Laterality: N/A;  left partial colectomy with colostomy  . LAPAROTOMY  04/19/2012   Procedure: EXPLORATORY LAPAROTOMY;  Surgeon: Adin Hector, MD;  [facility]: [facility];  Service: General;  Laterality: N/A;  . LAPAROTOMY N/A 11/29/2015   Procedure: EXPLORATORY LAPAROTOMY; SUBTOTAL COLECTOMY WITH HARTMAN PROCEDURE AND END COLOSTOMY;   Surgeon: Johnathan Hausen, MD;  [facility]: [facility];  Service: General;  Laterality: N/A;  . TUBAL LIGATION    . VENTRAL HERNIA REPAIR  04/19/2012   Procedure: HERNIA REPAIR VENTRAL ADULT;  Surgeon: Adin Hector, MD;  [facility];  Service: General;  Laterality: N/A;    There were no vitals filed for this visit.      Subjective Assessment - 11/09/16 1648    Subjective Reports that she took a pain pill but her back has been hurting today (8/10)   Pertinent History Abdominal surgeries.   Limitations Sitting;Standing   How long can you sit comfortably? 10-15 min (15-20 min previously); with sitting > 30 min pain up to 7-8/10   How long can you stand comfortably? 20 min (10-15 min previously) with pain up to 7-8/10   Patient Stated Goals Get out of pain.   Currently in Pain? Yes   Pain Score 1    Pain Location Back   Pain Orientation Mid   Pain Descriptors / Indicators Discomfort   Pain Type Chronic pain   Pain Onset More than a month ago            [facility] PT Assessment - 11/09/16 0001      Assessment   Medical Diagnosis Chronic bilateral thoracic pain.     Precautions   Precautions None     Restrictions   Weight Bearing Restrictions No  [facility] Adult PT Treatment/Exercise - 11/09/16 0001      Modalities   Modalities Electrical Stimulation     Electrical Stimulation   Electrical Stimulation Location B thoracic paraspinals   Electrical Stimulation Action IFC   Electrical Stimulation Parameters 1-10 hz x15 min   Electrical Stimulation Goals Pain;Tone     Manual Therapy   Manual Therapy Soft tissue mobilization   Soft tissue mobilization STW to B thoracic paraspinals, latissimus dorsi to reduce tone and pain in prone                  PT Short Term Goals - 08/17/16 1923      PT SHORT TERM GOAL #1   Title STG's=LTG's.           PT Long Term Goals - 10/20/16 1024      PT LONG TERM GOAL #1   Title Independent with  a HEP (11/17/16)   Status On-going     PT LONG TERM GOAL #2   Title Sit 30 minutes with pain not > 3/10.   Status On-going     PT LONG TERM GOAL #3   Title Stand 20 minutes with pain not > 3/10.   Status On-going     PT LONG TERM GOAL #4   Title Perform ADL's with pain not > 3/10.   Baseline met with medication, not met without medication   Status Partially Met               Plan - 11/09/16 1727    Clinical Impression Statement Patient tolerated today's treatment well although she arrived with reports of increased mid back pain earlier today. Patient continues to present with tightness in B thoracolumbar paraspinals especially along L sided musclature. Tightness especially prominent T12-L2 region upon palpation. Normal modalities response noted following removal of the modalities.   Rehab Potential Good   PT Frequency 2x / week   PT Duration 4 weeks   PT Treatment/Interventions ADLs/Self Care Home Management;Electrical Stimulation;Ultrasound;Moist Heat;Therapeutic activities;Therapeutic exercise;Patient/family education;Manual techniques;Dry needling   PT Next Visit Plan needs updated HEP, manual, modalities, scapular strengthening   Consulted and Agree with Plan of Care Patient      Patient will benefit from skilled therapeutic intervention in order to improve the following deficits and impairments:  Pain, Decreased activity tolerance  Visit Diagnosis: Pain in thoracic spine     Problem List Patient Active Problem List   Diagnosis Date Noted  . Pain medication agreement signed 10/10/2016  . Opioid dependence ([place]) 10/10/2016  . Status post colostomy takedown 04/12/2016  . Chronic constipation 12/03/2015  . Peritonitis with abscess of intestine ([place]) 11/29/2015  . COPD (chronic obstructive pulmonary disease) ([place]) 09/20/2015  . Osteopenia 06/10/2014  . Vitamin D deficiency disease 06/10/2014  . Hypothyroidism   . Depression 11/18/2012  . Insomnia 11/18/2012  .  Chronic pain syndrome 11/18/2012  . HLD (hyperlipidemia) 09/01/2012  . GAD (generalized anxiety disorder) 09/01/2012    Class: Chronic  . S/P colostomy ([place]) 04/19/2012  . Normocytic anemia 10/15/2011  . Hypertension 10/13/2011  . Chronic back pain 10/13/2011    Wynelle Fanny, PTA 11/09/2016, 5:40 PM  [facility] [address] [place], [place], [address] Phone: [phone]   Fax:  [phone]  Name: Stacy Dalton MRN: [id number] Date of Birth: [birth year]  PHYSICAL THERAPY DISCHARGE SUMMARY  Visits from Start of Care: 15.  Current functional level related to goals / functional outcomes: See above.   Remaining  deficits: Good improvement but continued thoracic pain.   Education / Equipment: HEP.  Plan: Patient agrees to discharge.  Patient goals were partially met. Patient is being discharged due to not returning since the last visit.  ?????         Mali Applegate MPT

## 2016-11-14 ENCOUNTER — Encounter: Payer: Medicare Other | Admitting: Physical Therapy

## 2016-11-16 ENCOUNTER — Encounter: Payer: Medicare Other | Admitting: Physical Therapy

## 2016-11-20 ENCOUNTER — Telehealth: Payer: Self-pay | Admitting: Family

## 2016-11-20 MED ORDER — TEMAZEPAM 30 MG PO CAPS: 30.0000 mg | ORAL_CAPSULE | Freq: Every evening | ORAL | 4 refills | Status: DC | PRN

## 2016-11-20 NOTE — Telephone Encounter (Signed)
What is the name of the medication? Restoril. She had to take two, per christy, so she is out.  Have you contacted your pharmacy to request a refill? yes  Which pharmacy would you like this sent to? [facility].   Patient notified that their request is being sent to the clinical staff for review and that they should receive a call once it is complete. If they do not receive a call within 24 hours they can check with their pharmacy or our office.

## 2016-11-20 NOTE — Telephone Encounter (Signed)
Restoril Prescription sent to pharmacy

## 2016-11-21 ENCOUNTER — Encounter: Payer: Self-pay | Admitting: Family

## 2016-11-21 ENCOUNTER — Ambulatory Visit (INDEPENDENT_AMBULATORY_CARE_PROVIDER_SITE_OTHER): Payer: Medicare Other | Admitting: Family

## 2016-11-21 VITALS — BP 120/77 | HR 94 | Temp 98.3°F | Ht 64.0 in | Wt 114.6 lb

## 2016-11-21 DIAGNOSIS — G8929 Other chronic pain: Secondary | ICD-10-CM

## 2016-11-21 DIAGNOSIS — M546 Pain in thoracic spine: Secondary | ICD-10-CM

## 2016-11-21 MED ORDER — HYDROCODONE-ACETAMINOPHEN 10-325 MG PO TABS: 1.0000 | ORAL_TABLET | Freq: Three times a day (TID) | ORAL | 0 refills | Status: DC | PRN

## 2016-11-21 MED ORDER — BACLOFEN 10 MG PO TABS: 10.0000 mg | ORAL_TABLET | Freq: Three times a day (TID) | ORAL | 0 refills | Status: DC

## 2016-11-21 NOTE — Progress Notes (Signed)
   Subjective:    Patient ID: Stacy Dalton, female    DOB: 1950-10-28, 66 y.o.   MRN: [id number]  Back Pain  This is a chronic problem. The current episode started more than 1 year ago. The problem occurs intermittently. The problem has been waxing and waning since onset. The pain is present in the lumbar spine. The pain is at a severity of 9/10. The pain is moderate. Associated symptoms include weakness. Pertinent negatives include no bladder incontinence, bowel incontinence, leg pain or pelvic pain. Risk factors include sedentary lifestyle. She has tried bed rest and analgesics for the symptoms. The treatment provided mild relief.      Review of Systems  Gastrointestinal: Negative for bowel incontinence.  Genitourinary: Negative for bladder incontinence and pelvic pain.  Musculoskeletal: Positive for back pain.  Neurological: Positive for weakness.  All other systems reviewed and are negative.      Objective:   Physical Exam  Constitutional: She is oriented to person, place, and time. She appears well-developed and well-nourished. No distress.  HENT:  Head: Normocephalic.  Eyes: Pupils are equal, round, and reactive to light.  Cardiovascular: Normal rate, regular rhythm, normal heart sounds and intact distal pulses.   No murmur heard. Pulmonary/Chest: Effort normal and breath sounds normal. No respiratory distress. She has no wheezes.  Abdominal: Soft. Bowel sounds are normal. She exhibits no distension. There is no tenderness.  Musculoskeletal: She exhibits edema.  Pain in thoracic area with rotation    Neurological: She is alert and oriented to person, place, and time.  Skin: Skin is warm and dry.  Psychiatric: She has a normal mood and affect. Her behavior is normal. Judgment and thought content normal.  Vitals reviewed.     BP 120/77   Pulse 94   Temp 98.3 F (36.8 C) (Oral)   Ht 5\' 4"  (1.626 m)   Wt 114 lb 9.6 oz (52 kg)   BMI 19.67 kg/m      Assessment &  Plan:  1. Chronic bilateral thoracic back pain Rest Ice and heat Will stop ultram today and restart Norco today Baclofen ordered today RTO prn  - HYDROcodone-acetaminophen (NORCO) 10-325 MG tablet; Take 1 tablet by mouth every 8 (eight) hours as needed.  Dispense: 90 tablet; Refill: 0 - baclofen (LIORESAL) 10 MG tablet; Take 1 tablet (10 mg total) by mouth 3 (three) times daily.  Dispense: 30 each; Refill: 0  Pt reviewed in [place] controlled database- PT has only received controlled medication from me.    Stacy Dun, FNP

## 2016-11-21 NOTE — Patient Instructions (Signed)
Chronic Back Pain When back pain lasts longer than 3 months, it is called chronic back pain.The cause of your back pain may not be known. Some common causes include:  Wear and tear (degenerative disease) of the bones, ligaments, or disks in your back.  Inflammation and stiffness in your back (arthritis). People who have chronic back pain often go through certain periods in which the pain is more intense (flare-ups). Many people can learn to manage the pain with home care. Follow these instructions at home: Pay attention to any changes in your symptoms. Take these actions to help with your pain: Activity   Avoid bending and activities that make the problem worse.  Do not sit or stand in one place for long periods of time.  Take brief periods of rest throughout the day. This will reduce your pain. Resting in a lying or standing position is usually better than sitting to rest.  When you are resting for longer periods, mix in some mild activity or stretching between periods of rest. This will help to prevent stiffness and pain.  Get regular exercise. Ask your health care provider what activities are safe for you.  Do not lift anything that is heavier than 10 lb (4.5 kg). Always use proper lifting technique, which includes:  Bending your knees.  Keeping the load close to your body.  Avoiding twisting. Managing pain   If directed, apply ice to the painful area. Your health care provider may recommend applying ice during the first 24-48 hours after a flare-up begins.  Put ice in a plastic bag.  Place a towel between your skin and the bag.  Leave the ice on for 20 minutes, 2-3 times per day.  After icing, apply heat to the affected area as often as told by your health care provider. Use the heat source that your health care provider recommends, such as a moist heat pack or a heating pad.  Place a towel between your skin and the heat source.  Leave the heat on for 20-30  minutes.  Remove the heat if your skin turns bright red. This is especially important if you are unable to feel pain, heat, or cold. You may have a greater risk of getting burned.  Try soaking in a warm tub.  Take over-the-counter and prescription medicines only as told by your health care provider.  Keep all follow-up visits as told by your health care provider. This is important. Contact a health care provider if:  You have pain that is not relieved with rest or medicine. Get help right away if:  You have weakness or numbness in one or both of your legs or feet.  You have trouble controlling your bladder or your bowels.  You have nausea or vomiting.  You have pain in your abdomen.  You have shortness of breath or you faint. This information is not intended to replace advice given to you by your health care provider. Make sure you discuss any questions you have with your health care provider. Document Released: 07/27/2004 Document Revised: 10/28/2015 Document Reviewed: 12/07/2014 Elsevier Interactive Patient Education  2017 Elsevier Inc.  

## 2016-11-22 ENCOUNTER — Ambulatory Visit: Payer: Medicare Other

## 2016-11-24 ENCOUNTER — Ambulatory Visit: Payer: Medicare Other

## 2016-12-02 ENCOUNTER — Other Ambulatory Visit: Payer: Self-pay | Admitting: Nurse Practitioner

## 2016-12-04 ENCOUNTER — Ambulatory Visit (INDEPENDENT_AMBULATORY_CARE_PROVIDER_SITE_OTHER): Payer: Medicare Other | Admitting: *Deleted

## 2016-12-04 VITALS — BP 132/73 | HR 76 | Ht 61.5 in | Wt 117.0 lb

## 2016-12-04 DIAGNOSIS — Z780 Asymptomatic menopausal state: Secondary | ICD-10-CM

## 2016-12-04 DIAGNOSIS — E559 Vitamin D deficiency, unspecified: Secondary | ICD-10-CM

## 2016-12-04 DIAGNOSIS — Z8739 Personal history of other diseases of the musculoskeletal system and connective tissue: Secondary | ICD-10-CM

## 2016-12-04 DIAGNOSIS — M8580 Other specified disorders of bone density and structure, unspecified site: Secondary | ICD-10-CM

## 2016-12-04 DIAGNOSIS — Z0000 Encounter for general adult medical examination without abnormal findings: Secondary | ICD-10-CM

## 2016-12-04 NOTE — Patient Instructions (Signed)
Thank you for coming in for your Annual Wellness Visit today!   Please keep your follow up appointment with Evelina Dun, FNP tomorrow, and your dexa scan   Please try to incorporate 3 balanced meals into your diet daily- including mostly lean proteins, vegetables, fruits, and whole grains  Work on increasing your walking to 3 times per week for about 30 minutes  Schedule appointment for your eye exam  Please consider scheduling mammogram appointment and updating immunizations soon   I have given your information to review on Advanced Directives.  If you have these drawn up, we can file a copy on in your electronic medical record if you would like [place] to      Preventive Care 65 Years and Older, Female Preventive care refers to lifestyle choices and visits with your health care provider that can promote health and wellness. What does preventive care include?  A yearly physical exam. This is also called an annual well check.  Dental exams once or twice a year.  Routine eye exams. Ask your health care provider how often you should have your eyes checked.  Personal lifestyle choices, including: ? Daily care of your teeth and gums. ? Regular physical activity. ? Eating a healthy diet. ? Avoiding tobacco and drug use. ? Limiting alcohol use. ? Practicing safe sex. ? Taking low-dose aspirin every day. ? Taking vitamin and mineral supplements as recommended by your health care provider. What happens during an annual well check? The services and screenings done by your health care provider during your annual well check will depend on your age, overall health, lifestyle risk factors, and family history of disease. Counseling Your health care provider may ask you questions about your:  Alcohol use.  Tobacco use.  Drug use.  Emotional well-being.  Home and relationship well-being.  Sexual activity.  Eating habits.  History of falls.  Memory and ability to understand  (cognition).  Work and work Statistician.  Reproductive health.  Screening You may have the following tests or measurements:  Height, weight, and BMI.  Blood pressure.  Lipid and cholesterol levels. These may be checked every 5 years, or more frequently if you are over 72 years old.  Skin check.  Lung cancer screening. You may have this screening every year starting at age 20 if you have a 30-pack-year history of smoking and currently smoke or have quit within the past 15 years.  Fecal occult blood test (FOBT) of the stool. You may have this test every year starting at age 52.  Flexible sigmoidoscopy or colonoscopy. You may have a sigmoidoscopy every 5 years or a colonoscopy every 10 years starting at age 35.  Hepatitis C blood test.  Hepatitis B blood test.  Sexually transmitted disease (STD) testing.  Diabetes screening. This is done by checking your blood sugar (glucose) after you have not eaten for a while (fasting). You may have this done every 1-3 years.  Bone density scan. This is done to screen for osteoporosis. You may have this done starting at age 59.  Mammogram. This may be done every 1-2 years. Talk to your health care provider about how often you should have regular mammograms.  Talk with your health care provider about your test results, treatment options, and if necessary, the need for more tests. Vaccines Your health care provider may recommend certain vaccines, such as:  Influenza vaccine. This is recommended every year.  Tetanus, diphtheria, and acellular pertussis (Tdap, Td) vaccine. You may need a Td  booster every 10 years.  Varicella vaccine. You may need this if you have not been vaccinated.  Zoster vaccine. You may need this after age 92.  Measles, mumps, and rubella (MMR) vaccine. You may need at least one dose of MMR if you were born in 1957 or later. You may also need a second dose.  Pneumococcal 13-valent conjugate (PCV13) vaccine. One dose  is recommended after age 94.  Pneumococcal polysaccharide (PPSV23) vaccine. One dose is recommended after age 29.  Meningococcal vaccine. You may need this if you have certain conditions.  Hepatitis A vaccine. You may need this if you have certain conditions or if you travel or work in places where you may be exposed to hepatitis A.  Hepatitis B vaccine. You may need this if you have certain conditions or if you travel or work in places where you may be exposed to hepatitis B.  Haemophilus influenzae type b (Hib) vaccine. You may need this if you have certain conditions.  Talk to your health care provider about which screenings and vaccines you need and how often you need them. This information is not intended to replace advice given to you by your health care provider. Make sure you discuss any questions you have with your health care provider. Document Released: 07/16/2015 Document Revised: 03/08/2016 Document Reviewed: 04/20/2015 Elsevier Interactive Patient Education  2017 [employer].

## 2016-12-04 NOTE — Progress Notes (Signed)
Subjective:   Stacy Dalton is a 66 y.o. female who presents for Medicare Annual (Subsequent) preventive examination.  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  Stacy Dalton lives alone during the week, and has 2 of her grandchildren on the weekends.  She has 5 children- 4 boys and 1 girl, and 14 grandchildren.  She is very involved in her church and feels like she has a good support system including family and friends. Her husband passed away in 2012/10/13 from cancer, she feels like her appetite and healthy habits including diet and exercise have diminished since then.  She also has a history of intestinal abscess and colostomy - with takedown in 04/2016.  She has set goals to improve her diet and increase physical activity.      Review of Systems:  She states she has chronic back pain but feels this has recently improved slightly with the combination of baclofen and tramadol.   Describes an episode 2 days ago where she felt that she could not keep grip of the steering wheel of her car well with either hand felt like her arms "kept falling" when she would raise them to hold the wheel.  She also noticed that she was unable to express herself normally with her words- describes this as not being able to find the words she wanted to say.  Patient states all symptoms have since resolved.  Other systems normal    Objective:     Vitals: BP 132/73   Pulse 76   Ht 5' 1.5" (1.562 m)   Wt 117 lb (53.1 kg)   BMI 21.75 kg/m   Body mass index is 21.75 kg/m.   Tobacco History  Smoking Status  . Former Smoker  . Packs/day: 1.50  . Years: 20.00  . Types: Cigarettes  . Quit date: 10/03/1997  Smokeless Tobacco  . Never Used       Past Medical History:  Diagnosis Date  . Anxiety    panic attacks  . COPD (chronic obstructive pulmonary disease) ([place])    told has copd, no current inhaler use  . Depression   . GERD (gastroesophageal reflux disease)   .  Hypertension   . Hypothyroidism   . Insomnia   . Migraine   . Migraine   . Osteopenia    Past Surgical History:  Procedure Laterality Date  . ABDOMINAL HYSTERECTOMY    . bladder tac    . COLON SURGERY    . COLOSTOMY CLOSURE  04/19/2012   Procedure: COLOSTOMY CLOSURE;  Surgeon: Adin Hector, MD;  Location: [facility];  Service: General;  Laterality: N/A;  Laparotomy, Resection and Closure of Colostomy  . COLOSTOMY TAKEDOWN N/A 04/12/2016   Procedure: [place] TAKEDOWN;  Surgeon: Johnathan Hausen, MD;  Location: [facility];  Service: General;  Laterality: N/A;  . LAPAROTOMY  10/04/2011, colostomy also   Procedure: EXPLORATORY LAPAROTOMY;  Surgeon: Adin Hector, MD;  Location: [facility];  Service: General;  Laterality: N/A;  left partial colectomy with colostomy  . LAPAROTOMY  04/19/2012   Procedure: EXPLORATORY LAPAROTOMY;  Surgeon: Adin Hector, MD;  Location: [facility];  Service: General;  Laterality: N/A;  . LAPAROTOMY N/A 11/29/2015   Procedure: EXPLORATORY LAPAROTOMY; SUBTOTAL COLECTOMY WITH HARTMAN PROCEDURE AND END COLOSTOMY;  Surgeon: Johnathan Hausen, MD;  Location: [facility];  Service: General;  Laterality: N/A;  . TUBAL LIGATION    . VENTRAL HERNIA REPAIR  04/19/2012   Procedure: HERNIA  REPAIR VENTRAL ADULT;  Surgeon: Adin Hector, MD;  Location: [facility];  Service: General;  Laterality: N/A;   Family History  Problem Relation Age of Onset  . Heart disease Mother   . Hypertension Mother   . Cancer Sister        sinus  . Diabetes Brother   . Heart disease Brother   . Thyroid disease Brother   . Cancer Sister        abdominal ?  . Thyroid disease Sister   . Cancer Other        GE junction adenocarcinoma  . Emphysema Father   . Heart disease Father   . Stroke Father   . Hypertension Father   . COPD Sister   . Thyroid disease Sister   . Thyroid disease Sister   . Diabetes Brother   . Thyroid disease Brother   . Thyroid disease Brother   . Hypertension Brother   .  Post-traumatic stress disorder Brother   . Thyroid disease Brother   . Thyroid disease Brother   . Hypertension Brother   . Transient ischemic attack Brother    History  Sexual Activity  . Sexual activity: No    Outpatient Encounter Prescriptions as of 12/04/2016  Medication Sig  . ALPRAZolam (XANAX) 0.5 MG tablet Take 1 tablet (0.5 mg total) by mouth 2 (two) times daily as needed for anxiety.  [name] atorvastatin (LIPITOR) 40 MG tablet TAKE ONE-HALF (1/2) TABLET DAILY (CHANGE IN DIRECTIONS)  . AZOR 10-40 MG tablet TAKE 1 TABLET DAILY  . baclofen (LIORESAL) 10 MG tablet Take 1 tablet (10 mg total) by mouth 3 (three) times daily. (Patient taking differently: Take 10 mg by mouth 2 (two) times daily. )  . COMBIVENT RESPIMAT 20-100 MCG/ACT AERS respimat Inhale 1 puff into the lungs every 6 (six) hours as needed for wheezing.   . docusate sodium (COLACE) 100 MG capsule Take 1 capsule (100 mg total) by mouth 2 (two) times daily as needed for mild constipation.  . DULoxetine (CYMBALTA) 30 MG capsule Take 1 capsule (30 mg total) by mouth daily.  . DULoxetine (CYMBALTA) 60 MG capsule TAKE 1 CAPSULE DAILY  . levothyroxine (SYNTHROID, LEVOTHROID) 50 MCG tablet TAKE 1 TABLET DAILY  . temazepam (RESTORIL) 30 MG capsule Take 1 capsule (30 mg total) by mouth at bedtime as needed for sleep.  . traMADol (ULTRAM) 50 MG tablet Take by mouth 2 (two) times daily.  [name] triamterene-hydrochlorothiazide (DYAZIDE) 37.5-25 MG capsule TAKE 1 CAPSULE DAILY  . Vitamin D, Ergocalciferol, (DRISDOL) 50000 units CAPS capsule TAKE 1 CAPSULE EVERY 7 DAYS  . HYDROcodone-acetaminophen (NORCO) 10-325 MG tablet Take 1 tablet by mouth every 8 (eight) hours as needed. (Patient not taking: Reported on 12/04/2016)   No facility-administered encounter medications on file as of 12/04/2016.     Activities of Daily Living In your present state of health, do you have any difficulty performing the following activities: 12/04/2016 04/12/2016    Hearing? N N  Vision? N N  Difficulty concentrating or making decisions? Y N  Walking or climbing stairs? N N  Dressing or bathing? N N  Doing errands, shopping? N N  Some recent data might be hidden    Patient Care Team: [name], FNP as PCP - General (Family Medicine)    Assessment:    Exercise Activities and Dietary recommendations Current Exercise Habits: The patient does not participate in regular exercise at present, Exercise limited by: orthopedic condition(s) (back and knee pain)  Goals    . Exercise 3x per week (30 min per time)          Increase walking for exercise.     . Have 3 meals a day          Please work on having 3 balanced meals per day including mostly lean proteins, vegetables, fruits, and whole grains.       Fall Risk Fall Risk  12/04/2016 11/21/2016 10/31/2016 10/10/2016 09/19/2016  Falls in the past year? Yes Yes Yes Yes Yes  Number falls in past yr: 1 2 or more 2 or more 1 1  Injury with Fall? No No No No Yes  Follow up Education provided - - - -   Depression Screen PHQ 2/9 Scores 12/04/2016 11/21/2016 10/31/2016 10/10/2016  PHQ - 2 Score 3 5 6 4   PHQ- 9 Score - 13 18 13     PHQ 9 - 11 today   Cognitive Function MMSE - Mini Mental State Exam 12/04/2016 06/16/2015  Orientation to time 5 5  Orientation to Place 5 5  Registration 3 3  Attention/ Calculation 5 5  Recall 2 3  Language- name 2 objects 2 2  Language- repeat 1 1  Language- follow 3 step command 3 3  Language- read & follow direction 1 1  Write a sentence 1 1  Copy design 1 1  Total score 29 30        Immunization History  Administered Date(s) Administered  . Influenza Whole 04/29/2012  . Influenza,inj,Quad PF,36+ Mos 05/05/2013, 06/10/2014, 05/13/2015, 05/05/2016   Screening Tests Health Maintenance  Topic Date Due  . DEXA SCAN  06/10/2016  . MAMMOGRAM  12/04/2016 (Originally 07/16/1998)  . TETANUS/TDAP  12/04/2016 (Originally 07/17/1967)  . PNA vac Low Risk Adult (1 of  2 - PCV13) 11/17/2020 (Originally 07/16/2013)  . INFLUENZA VACCINE  01/31/2017  . COLONOSCOPY  02/27/2022  . Hepatitis C Screening  Completed   Scheduled for dexa scan tomorrow 12/05/16 Declined scheduling mammogram Declined tetanus or pneumovax today       Plan:      Scheduled for follow up appointment with Evelina Dun, FNP for tomorrow for weakness and aphasia.  Advised to seek immediate medical care if any unusual neurological symptoms develop before appointment.  Scheduled for dexa scan tomorrow 12/05/16    I have personally reviewed and noted the following in the patient's chart:   . Medical and social history . Use of alcohol, tobacco or illicit drugs  . Current medications and supplements . Functional ability and status . Nutritional status . Physical activity . Advanced directives . List of other physicians . Hospitalizations, surgeries, and ER visits in previous 12 months . Vitals . Screenings to include cognitive, depression, and falls . Referrals and appointments  In addition, I have reviewed and discussed with patient certain preventive protocols, quality metrics, and best practice recommendations. A written personalized care plan for preventive services as well as general preventive health recommendations were provided to patient.     Geraldyne Barraclough M, RN  12/04/2016   I have reviewed and agree with the above AWV documentation.   Evelina Dun, FNP

## 2016-12-05 ENCOUNTER — Other Ambulatory Visit: Payer: Self-pay | Admitting: *Deleted

## 2016-12-05 ENCOUNTER — Encounter: Payer: Self-pay | Admitting: Family

## 2016-12-05 ENCOUNTER — Ambulatory Visit (INDEPENDENT_AMBULATORY_CARE_PROVIDER_SITE_OTHER): Payer: Medicare Other | Admitting: Family

## 2016-12-05 ENCOUNTER — Other Ambulatory Visit (INDEPENDENT_AMBULATORY_CARE_PROVIDER_SITE_OTHER): Payer: Medicare Other

## 2016-12-05 VITALS — BP 139/83 | HR 76 | Temp 97.9°F | Ht 61.5 in | Wt 117.0 lb

## 2016-12-05 DIAGNOSIS — Z780 Asymptomatic menopausal state: Secondary | ICD-10-CM | POA: Diagnosis not present

## 2016-12-05 DIAGNOSIS — R531 Weakness: Secondary | ICD-10-CM | POA: Diagnosis not present

## 2016-12-05 DIAGNOSIS — M546 Pain in thoracic spine: Secondary | ICD-10-CM

## 2016-12-05 DIAGNOSIS — R4701 Aphasia: Secondary | ICD-10-CM

## 2016-12-05 DIAGNOSIS — R2990 Unspecified symptoms and signs involving the nervous system: Secondary | ICD-10-CM | POA: Diagnosis not present

## 2016-12-05 DIAGNOSIS — G8929 Other chronic pain: Secondary | ICD-10-CM

## 2016-12-05 NOTE — Patient Instructions (Signed)
Transient Ischemic Attack °A transient ischemic attack (TIA) is a "warning stroke" that causes stroke-like symptoms. Unlike a stroke, a TIA does not cause permanent damage to the brain. The symptoms of a TIA can happen very fast and do not last long. It is important to know the symptoms of a TIA and what to do. This can help prevent a major stroke or death. °What are the causes? °A TIA is caused by a temporary blockage in an artery in the brain or neck (carotid artery). The blockage does not allow the brain to get the blood supply it needs and can cause different symptoms. The blockage can be caused by either: °· A blood clot. °· Fatty buildup (plaque) in a neck or brain artery. ° °What increases the risk? °· High blood pressure (hypertension). °· High cholesterol. °· Diabetes mellitus. °· Heart disease. °· The buildup of plaque in the blood vessels (peripheral artery disease or atherosclerosis). °· The buildup of plaque in the blood vessels that provide blood and oxygen to the brain (carotid artery stenosis). °· An abnormal heart rhythm (atrial fibrillation). °· Obesity. °· Using any tobacco products, including cigarettes, chewing tobacco, or electronic cigarettes. °· Taking oral contraceptives, especially in combination with using tobacco. °· Physical inactivity. °· A diet high in fats, salt (sodium), and calories. °· Excessive alcohol use. °· Use of illegal drugs (especially cocaine and methamphetamine). °· Being female. °· Being African American. °· Being over the age of 55 years. °· Family history of stroke. °· Previous history of blood clots, stroke, TIA, or heart attack. °· Sickle cell disease. °What are the signs or symptoms? °TIA symptoms are the same as a stroke but are temporary. These symptoms usually develop suddenly, or may be newly present upon waking from sleep: °· Sudden weakness or numbness of the face, arm, or leg, especially on one side of the body. °· Sudden trouble walking or difficulty moving  arms or legs. °· Sudden confusion. °· Sudden personality changes. °· Trouble speaking (aphasia) or understanding. °· Difficulty swallowing. °· Sudden trouble seeing in one or both eyes. °· Double vision. °· Dizziness. °· Loss of balance or coordination. °· Sudden severe headache with no known cause. °· Trouble reading or writing. °· Loss of bowel or bladder control. °· Loss of consciousness. ° °How is this diagnosed? °Your health care provider may be able to determine the presence or absence of a TIA based on your symptoms, history, and physical exam. CT scan of the brain is usually performed to help identify a TIA. Other tests may include: °· Electrocardiography (ECG). °· Continuous heart monitoring. °· Echocardiography. °· Carotid ultrasonography. °· MRI. °· A scan of the brain circulation. °· Blood tests. ° °How is this treated? °Since the symptoms of TIA are the same as a stroke, it is important to seek treatment as soon as possible. You may need a medicine to dissolve a blood clot (thrombolytic) if that is the cause of the TIA. This medicine cannot be given if too much time has passed. Treatment may also include: °· Rest, oxygen, fluids through an IV tube, and medicines to thin the blood (anticoagulants). °· Measures will be taken to prevent short-term and long-term complications, including infection from breathing foreign material into the lungs (aspiration pneumonia), blood clots in the legs, and falls. °· Procedures to either remove plaque in the carotid arteries or dilate carotid arteries that have narrowed due to plaque. Those procedures are: °? Carotid endarterectomy. °? Carotid angioplasty and stenting. °· Medicines   and diet may be used to address diabetes, high blood pressure, and other underlying risk factors. ° °Follow these instructions at home: °· Take medicines only as directed by your health care provider. Follow the directions carefully. Medicines may be used to control risk factors for a stroke.  Be sure you understand all your medicine instructions. °· You may be told to take aspirin or the anticoagulant warfarin. Warfarin needs to be taken exactly as instructed. °? Taking too much or too little warfarin is dangerous. Too much warfarin increases the risk of bleeding. Too little warfarin continues to allow the risk for blood clots. While taking warfarin, you will need to have regular blood tests to measure your blood clotting time. A PT blood test measures how long it takes for blood to clot. Your PT is used to calculate another value called an INR. Your PT and INR help your health care provider to adjust your dose of warfarin. The dose can change for many reasons. It is critically important that you take warfarin exactly as prescribed. °? Many foods, especially foods high in vitamin K can interfere with warfarin and affect the PT and INR. Foods high in vitamin K include spinach, kale, broccoli, cabbage, collard and turnip greens, Brussels sprouts, peas, cauliflower, seaweed, and parsley, as well as beef and pork liver, green tea, and soybean oil. You should eat a consistent amount of foods high in vitamin K. Avoid major changes in your diet, or notify your health care provider before changing your diet. Arrange a visit with a dietitian to answer your questions. °? Many medicines can interfere with warfarin and affect the PT and INR. You must tell your health care provider about any and all medicines you take; this includes all vitamins and supplements. Be especially cautious with aspirin and anti-inflammatory medicines. Do not take or discontinue any prescribed or over-the-counter medicine except on the advice of your health care provider or pharmacist. °? Warfarin can have side effects, such as excessive bruising or bleeding. You will need to hold pressure over cuts for longer than usual. Your health care provider or pharmacist will discuss other potential side effects. °? Avoid sports or activities that  may cause injury or bleeding. °? Be careful when shaving, flossing your teeth, or handling sharp objects. °? Alcohol can change the body's ability to handle warfarin. It is best to avoid alcoholic drinks or consume only very small amounts while taking warfarin. Notify your health care provider if you change your alcohol intake. °? Notify your dentist or other health care providers before procedures. °· Eat a diet that includes 5 or more servings of fruits and vegetables each day. This may reduce the risk of stroke. Certain diets may be prescribed to address high blood pressure, high cholesterol, diabetes, or obesity. °? A diet low in sodium, saturated fat, trans fat, and cholesterol is recommended to manage high blood pressure. °? A diet low in saturated fat, trans fat, and cholesterol, and high in fiber may control cholesterol levels. °? A controlled-carbohydrate, controlled-sugar diet is recommended to manage diabetes. °? A reduced-calorie diet that is low in sodium, saturated fat, trans fat, and cholesterol is recommended to manage obesity. °· Maintain a healthy weight. °· Stay physically active. It is recommended that you get at least 30 minutes of activity on most or all days. °· Do not use any tobacco products, including cigarettes, chewing tobacco, or electronic cigarettes. If you need help quitting, ask your health care provider. °· Limit alcohol intake   to no more than 1 drink per day for nonpregnant women and 2 drinks per day for men. One drink equals 12 ounces of beer, 5 ounces of wine, or 1½ ounces of hard liquor. °· Do not abuse drugs. °· A safe home environment is important to reduce the risk of falls. Your health care provider may arrange for specialists to evaluate your home. Having grab bars in the bedroom and bathroom is often important. Your health care provider may arrange for equipment to be used at home, such as raised toilets and a seat for the shower. °· Follow all instructions for follow-up  with your health care provider. This is very important. This includes any referrals and lab tests. Proper follow-up can prevent a stroke or another TIA from occurring. °How is this prevented? °The risk of a TIA can be decreased by appropriately treating high blood pressure, high cholesterol, diabetes, heart disease, and obesity, and by quitting smoking, limiting alcohol, and staying physically active. °Contact a health care provider if: °· You have personality changes. °· You have difficulty swallowing. °· You are seeing double. °· You have dizziness. °· You have a fever. °Get help right away if: °Any of the following symptoms may represent a serious problem that is an emergency. Do not wait to see if the symptoms will go away. Get medical help right away. Call your local emergency services (911 in [place] Do not drive yourself to the hospital. °· You have sudden weakness or numbness of the face, arm, or leg, especially on one side of the body. °· You have sudden trouble walking or difficulty moving arms or legs. °· You have sudden confusion. °· You have trouble speaking (aphasia) or understanding. °· You have sudden trouble seeing in one or both eyes. °· You have a loss of balance or coordination. °· You have a sudden, severe headache with no known cause. °· You have new chest pain or an irregular heartbeat. °· You have a partial or total loss of consciousness. ° °This information is not intended to replace advice given to you by your health care provider. Make sure you discuss any questions you have with your health care provider. °Document Released: 03/29/2005 Document Revised: 02/21/2016 Document Reviewed: 09/24/2013 °Elsevier Interactive Patient Education © 2017 [employer] ° °

## 2016-12-05 NOTE — Progress Notes (Signed)
   Subjective:    Patient ID: Stacy Dalton, female    DOB: 02/21/1951, 66 y.o.   MRN: [id number]  HPI PT presents to the office today for weakness. Pt states on Friday evening while driving she could not keep her right hand on the steering wheel and could not say the words she wanted. Pt reports that lasted for less than 24 hours.   She reports that she had moderate amount nausea and vomiting all last week from her Norco. She states she thought she was dehydrated and that was causing her symptoms. PT has since stopped her Norco and restarted her Ultram.    Review of Systems  Constitutional: Positive for fatigue.  Musculoskeletal: Positive for back pain.  Neurological: Positive for weakness. Negative for dizziness and facial asymmetry.  All other systems reviewed and are negative.      Objective:   Physical Exam  Constitutional: She is oriented to person, place, and time. She appears well-developed and well-nourished. No distress.  HENT:  Head: Normocephalic and atraumatic.  Right Ear: External ear normal.  Left Ear: External ear normal.  Nose: Nose normal.  Mouth/Throat: Oropharynx is clear and moist.  Eyes: Pupils are equal, round, and reactive to light.  Neck: Normal range of motion. Neck supple. No thyromegaly present.  Cardiovascular: Normal rate, regular rhythm, normal heart sounds and intact distal pulses.   No murmur heard. Pulmonary/Chest: Effort normal and breath sounds normal. No respiratory distress. She has no wheezes.  Abdominal: Soft. Bowel sounds are normal. She exhibits no distension. There is no tenderness.  Musculoskeletal: Normal range of motion. She exhibits no edema or tenderness.  Neurological: She is alert and oriented to person, place, and time.  Skin: Skin is warm and dry.  Psychiatric: She has a normal mood and affect. Her behavior is normal. Judgment and thought content normal.  Vitals reviewed.     BP 139/83   Pulse 76   Temp 97.9 F (36.6  C) (Oral)   Ht 5' 1.5" (1.562 m)   Wt 117 lb (53.1 kg)   BMI 21.75 kg/m      Assessment & Plan:  1. Stroke-like symptoms - Ambulatory referral to Neurology - CMP14+EGFR - CBC with Differential/Platelet  2. Aphasia - Ambulatory referral to Neurology - CMP14+EGFR - CBC with Differential/Platelet  3. Weakness - Ambulatory referral to Neurology - CMP14+EGFR - CBC with Differential/Platelet  4. Chronic bilateral thoracic back pain  - CMP14+EGFR - CBC with Differential/Platelet   All of pt's symptoms have resolved at this point. Makes me believe this was TIA, but I have placed a referral to be seen by Neurologists.  Discussed in length that if these symptoms occur again she needs to go to ED immediately  RTO prn and keep chronic follow up   Christy Hawks, FNP  

## 2016-12-06 ENCOUNTER — Ambulatory Visit (INDEPENDENT_AMBULATORY_CARE_PROVIDER_SITE_OTHER): Payer: Medicare Other | Admitting: Neurology

## 2016-12-06 ENCOUNTER — Encounter: Payer: Self-pay | Admitting: Neurology

## 2016-12-06 VITALS — BP 122/74 | HR 85 | Ht 61.0 in | Wt 116.0 lb

## 2016-12-06 DIAGNOSIS — R531 Weakness: Secondary | ICD-10-CM

## 2016-12-06 DIAGNOSIS — R002 Palpitations: Secondary | ICD-10-CM

## 2016-12-06 DIAGNOSIS — R471 Dysarthria and anarthria: Secondary | ICD-10-CM | POA: Diagnosis not present

## 2016-12-06 DIAGNOSIS — R079 Chest pain, unspecified: Secondary | ICD-10-CM

## 2016-12-06 LAB — CBC WITH DIFFERENTIAL/PLATELET
Basophils Absolute: 0 10*3/uL (ref 0.0–0.2)
Basos: 1 %
EOS (ABSOLUTE): 0.1 10*3/uL (ref 0.0–0.4)
Eos: 1 %
Hematocrit: 29.4 % — ABNORMAL LOW (ref 34.0–46.6)
Hemoglobin: 9.3 g/dL — ABNORMAL LOW (ref 11.1–15.9)
Immature Grans (Abs): 0 10*3/uL (ref 0.0–0.1)
Immature Granulocytes: 0 %
Lymphocytes Absolute: 1.2 10*3/uL (ref 0.7–3.1)
Lymphs: 25 %
MCH: 22.1 pg — ABNORMAL LOW (ref 26.6–33.0)
MCHC: 31.6 g/dL (ref 31.5–35.7)
MCV: 70 fL — ABNORMAL LOW (ref 79–97)
Monocytes Absolute: 0.6 10*3/uL (ref 0.1–0.9)
Monocytes: 12 %
Neutrophils Absolute: 3 10*3/uL (ref 1.4–7.0)
Neutrophils: 61 %
Platelets: 427 10*3/uL — ABNORMAL HIGH (ref 150–379)
RBC: 4.21 x10E6/uL (ref 3.77–5.28)
RDW: 16.8 % — ABNORMAL HIGH (ref 12.3–15.4)
WBC: 4.9 10*3/uL (ref 3.4–10.8)

## 2016-12-06 LAB — CMP14+EGFR
ALT: 9 IU/L (ref 0–32)
AST: 16 IU/L (ref 0–40)
Albumin/Globulin Ratio: 1.6 (ref 1.2–2.2)
Albumin: 4.4 g/dL (ref 3.6–4.8)
Alkaline Phosphatase: 83 IU/L (ref 39–117)
BUN/Creatinine Ratio: 18 (ref 12–28)
BUN: 22 mg/dL (ref 8–27)
Bilirubin Total: 0.2 mg/dL (ref 0.0–1.2)
CO2: 26 mmol/L (ref 18–29)
Calcium: 9.9 mg/dL (ref 8.7–10.3)
Chloride: 97 mmol/L (ref 96–106)
Creatinine, Ser: 1.25 mg/dL — ABNORMAL HIGH (ref 0.57–1.00)
GFR calc Af Amer: 51 mL/min/{1.73_m2} — ABNORMAL LOW (ref >59)
GFR calc non Af Amer: 44 mL/min/{1.73_m2} — ABNORMAL LOW (ref >59)
Globulin, Total: 2.8 g/dL (ref 1.5–4.5)
Glucose: 95 mg/dL (ref 65–99)
Potassium: 4.5 mmol/L (ref 3.5–5.2)
Sodium: 138 mmol/L (ref 134–144)
Total Protein: 7.2 g/dL (ref 6.0–8.5)

## 2016-12-06 NOTE — Progress Notes (Signed)
Subjective:    Patient ID: Stacy Dalton is a 66 y.o. female.  HPI     Star Age, MD, PhD [facility] [address] P.O. [place], [place] [address]  Dear Alyse Low,  I saw your patient, Stacy Dalton, upon your kind request in my neurologic clinic today for initial consultation of an transient episode of b/l UE weakness and difficulty with speech or language. The patient is unaccompanied today. As you know, Stacy Dalton is a 66 year old right-handed woman with an underlying medical history of hypothyroidism, history of peritonitis with abscess of intestine, chronic pain syndrome including chronic low back pain with narcotic pain medication use, status post colostomy takedown last year, hypertension, depression, anxiety, reflux disease, COPD, migraine headaches, osteopenia, insomnia, who reports a recent episode last week of b/l UE weakness and difficulty getting words out. Symptoms lasted for less than 24 hours. Symptoms started when she was driving. She did not go to the emergency room or call 911. She was seen by you on 12/05/2016 and I reviewed the note.  She was not slurring the speech, but the wrong words came out, no droopy face. She states that with this was happening she was driving. She had her grandchildren with her. She felt her right hand slipped off the steering wheel first and readjusted and then the left would slip off the steering wheel. She has not had a recent head CT or MRI brain. She reports, Sx started on 12/01/16 in the evening and lasted for about 12 hours. She did not have any headaches. She does of a history of migraines. She did not have any shortness of breath or palpitation or chest pain at the time but does have a history of indigestion, also chest pain, history of palpitations. She reports that she was not doing well when symptoms started. She had not been eating well for the past 2 days prior. She had not felt well. She does  not always sleep well. She lives alone. She has 5 children. Her husband died about 4 years ago. She quit smoking in 2000. She does not rink alcohol or caffeine on a daily basis. She worked as a Brewing technologist.  Her Past Medical History Is Significant For: Past Medical History:  Diagnosis Date  . Anxiety    panic attacks  . COPD (chronic obstructive pulmonary disease) ([place])    told has copd, no current inhaler use  . Depression   . GERD (gastroesophageal reflux disease)   . Hypertension   . Hypothyroidism   . Insomnia   . Migraine   . Migraine   . Osteopenia     Her Past Surgical History Is Significant For: Past Surgical History:  Procedure Laterality Date  . ABDOMINAL HYSTERECTOMY    . bladder tac    . COLON SURGERY    . COLOSTOMY CLOSURE  04/19/2012   Procedure: COLOSTOMY CLOSURE;  Surgeon: Adin Hector, MD;  Location: [facility];  Service: General;  Laterality: N/A;  Laparotomy, Resection and Closure of Colostomy  . COLOSTOMY TAKEDOWN N/A 04/12/2016   Procedure: [place] TAKEDOWN;  Surgeon: Johnathan Hausen, MD;  Location: [facility];  Service: General;  Laterality: N/A;  . LAPAROTOMY  10/04/2011, colostomy also   Procedure: EXPLORATORY LAPAROTOMY;  Surgeon: Adin Hector, MD;  Location: [facility];  Service: General;  Laterality: N/A;  left partial colectomy with colostomy  . LAPAROTOMY  04/19/2012   Procedure: EXPLORATORY LAPAROTOMY;  Surgeon: Adin Hector, MD;  Location:  [facility];  Service: General;  Laterality: N/A;  . LAPAROTOMY N/A 11/29/2015   Procedure: EXPLORATORY LAPAROTOMY; SUBTOTAL COLECTOMY WITH HARTMAN PROCEDURE AND END COLOSTOMY;  Surgeon: Johnathan Hausen, MD;  Location: [facility];  Service: General;  Laterality: N/A;  . TUBAL LIGATION    . VENTRAL HERNIA REPAIR  04/19/2012   Procedure: HERNIA REPAIR VENTRAL ADULT;  Surgeon: Adin Hector, MD;  Location: [facility];  Service: General;  Laterality: N/A;    Her Family History Is Significant For: Family History  Problem  Relation Age of Onset  . Heart disease Mother   . Hypertension Mother   . Cancer Sister        sinus  . Diabetes Brother   . Heart disease Brother   . Thyroid disease Brother   . Cancer Sister        abdominal ?  . Thyroid disease Sister   . Cancer Other        GE junction adenocarcinoma  . Emphysema Father   . Heart disease Father   . Stroke Father   . Hypertension Father   . COPD Sister   . Thyroid disease Sister   . Thyroid disease Sister   . Diabetes Brother   . Thyroid disease Brother   . Thyroid disease Brother   . Hypertension Brother   . Post-traumatic stress disorder Brother   . Thyroid disease Brother   . Thyroid disease Brother   . Hypertension Brother   . Transient ischemic attack Brother     Her Social History Is Significant For: Social History   Social History  . Marital status: Widowed    Spouse name: N/A  . Number of children: N/A  . Years of education: N/A   Social History Main Topics  . Smoking status: Former Smoker    Packs/day: 1.50    Years: 20.00    Types: Cigarettes    Quit date: 10/03/1997  . Smokeless tobacco: Never Used  . Alcohol use No  . Drug use: No  . Sexual activity: No   Other Topics Concern  . None   Social History Narrative   married    Her Allergies Are:  Allergies  Allergen Reactions  . Toradol [Ketorolac Tromethamine] Shortness Of Breath  . Demerol Swelling  . Esgic [Butalbital-Apap-Caffeine]     jittery  . Iodine Other (See Comments)    bp bottomed out per pt several hours later; unsure if pre medicated in past with cm; done in [place]  :   Her Current Medications Are:  Outpatient Encounter Prescriptions as of 12/06/2016  Medication Sig  . ALPRAZolam (XANAX) 0.5 MG tablet Take 1 tablet (0.5 mg total) by mouth 2 (two) times daily as needed for anxiety.  [name] atorvastatin (LIPITOR) 40 MG tablet TAKE ONE-HALF (1/2) TABLET DAILY (CHANGE IN DIRECTIONS)  . AZOR 10-40 MG tablet TAKE 1 TABLET DAILY  . baclofen (LIORESAL)  10 MG tablet Take 1 tablet (10 mg total) by mouth 3 (three) times daily. (Patient taking differently: Take 10 mg by mouth 2 (two) times daily. )  . COMBIVENT RESPIMAT 20-100 MCG/ACT AERS respimat Inhale 1 puff into the lungs every 6 (six) hours as needed for wheezing.   . docusate sodium (COLACE) 100 MG capsule Take 1 capsule (100 mg total) by mouth 2 (two) times daily as needed for mild constipation.  . DULoxetine (CYMBALTA) 30 MG capsule Take 1 capsule (30 mg total) by mouth daily.  . DULoxetine (CYMBALTA) 60 MG capsule TAKE 1  CAPSULE DAILY  . levothyroxine (SYNTHROID, LEVOTHROID) 50 MCG tablet TAKE 1 TABLET DAILY  . temazepam (RESTORIL) 30 MG capsule Take 1 capsule (30 mg total) by mouth at bedtime as needed for sleep.  . traMADol (ULTRAM) 50 MG tablet Take by mouth 2 (two) times daily.  [name] triamterene-hydrochlorothiazide (DYAZIDE) 37.5-25 MG capsule TAKE 1 CAPSULE DAILY  . Vitamin D, Ergocalciferol, (DRISDOL) 50000 units CAPS capsule TAKE 1 CAPSULE EVERY 7 DAYS  . [DISCONTINUED] HYDROcodone-acetaminophen (NORCO) 10-325 MG tablet Take 1 tablet by mouth every 8 (eight) hours as needed. (Patient not taking: Reported on 12/04/2016)  . [DISCONTINUED] traZODone (DESYREL) 50 MG tablet    No facility-administered encounter medications on file as of 12/06/2016.   : Review of Systems:  Out of a complete 14 point review of systems, all are reviewed and negative with the exception of these symptoms as listed below:  Review of Systems  Neurological:       Pt presents today to discuss symptoms of weakness and speech difficulty that happened on Friday. Pt was not feeling well during the day on Friday but in the evening, she was driving and noticed that she had hard time keeping both hands on the steering wheel. Pt also could not get her words out. Pt did not go to ED. Pt's symptoms have resolved.    Objective:  Neurologic Exam  Physical Exam Physical Examination:   Vitals:   12/06/16 1305  BP: 122/74   Pulse: 85    General Examination: The patient is a very pleasant 66 y.o. female in no acute distress. She appears thin. Adequately groomed.   HEENT: Normocephalic, atraumatic, pupils are equal, round and reactive to light and accommodation. Funduscopic exam is normal with sharp disc margins noted. Extraocular tracking is good without limitation to gaze excursion or nystagmus noted. Normal smooth pursuit is noted. Hearing is grossly intact. Face is symmetric with normal facial animation and normal facial sensation. Speech is clear with no dysarthria noted. There is no hypophonia. There is no lip, neck/head, jaw or voice tremor. Neck is supple with full range of passive and active motion. There are no carotid bruits on auscultation. Oropharynx exam reveals: mild mouth dryness, adequate dental hygiene. Tongue protrudes centrally and palate elevates symmetrically.    Chest: Clear to auscultation without wheezing, rhonchi or crackles noted.  Heart: S1+S2+0, regular and normal without murmurs, rubs or gallops noted.   Abdomen: Soft, non-tender and non-distended with normal bowel sounds appreciated on auscultation.  Extremities: There is no pitting edema in the distal lower extremities bilaterally. Pedal pulses are intact.  Skin: Warm and dry without trophic changes noted.  Musculoskeletal: exam reveals no obvious joint deformities, tenderness or joint swelling or erythema.   Neurologically:  Mental status: The patient is awake, alert and oriented in all 4 spheres. Her immediate and remote memory, attention, language skills and fund of knowledge are appropriate. There is no evidence of aphasia, agnosia, apraxia or anomia. Speech is clear with normal prosody and enunciation. Thought process is linear. Mood is constricted and affect is blunted.  Cranial nerves II - XII are as described above under HEENT exam. In addition: shoulder shrug is normal with equal shoulder height noted. Motor exam: Normal  bulk, strength and tone is noted. There is no drift, tremor or rebound. Romberg is negative. Reflexes are 2+ throughout. Babinski: Toes are flexor bilaterally. Fine motor skills and coordination: intact with normal finger taps, normal hand movements, normal rapid alternating patting, normal foot taps and normal  foot agility.  Cerebellar testing: No dysmetria or intention tremor on finger to nose testing. Heel to shin is unremarkable bilaterally. There is no truncal or gait ataxia.  Sensory exam: intact to light touch, pinprick, vibration, temperature sense in the upper and lower extremities.  Gait, station and balance: She stands slightly slowly, no limp noted, preserved arm swing bilaterally, tandem looks unremarkable.   Assessment and Plan:   In summary, Stacy Dalton is a very pleasant 66 y.o.-year old female with an underlying medical history of hypothyroidism, history of peritonitis with abscess of intestine, chronic pain syndrome including chronic low back pain with narcotic pain medication use, status post colostomy takedown last year, hypertension, depression, anxiety, reflux disease, COPD, migraine headaches, osteopenia, insomnia, who Presents for an episode of bilateral upper extremity weakness and difficulty with getting her words out. She did not experience any droopy face, slurring of speech, lower extremity weakness, no numbness woman with chest pain, no shortness of breath or palpitation at the time but does have a history of chest pain and shortness of breath as well as palpitations. On examination she has a nonfocal neurological exam. TIA seems unlikely given the bilateral nature of her weakness. Of note, she also reported not feeling well, having not had much in the way of nutrition 2 days prior. She strongly encouraged to stay well hydrated and well-nourished and well rested. I would recommend that her medication be streamlined and sedating medications be discontinued or reduced if  possible. I suggested further workup from a neurological standpoint including brain MRI without contrast, echocardiogram, carotid Doppler testing for vascular workup. I suggested she start a baby aspirin if tolerated. Sometimes she takes BC powder and she is encouraged to stop this and start taking a daily baby aspirin. We talked about risk factor reduction. Stroke risk factors in her case include history of smoking, hyperlipidemia, history of hypertension. Again, given her history of bilateral weakness and not one-sided weakness, no droopy face or actual slurring of speech think TIA is unlikely and I reassured him that regard, furthermore, reassuringly her exam is nonfocal. Nevertheless, we will call her with her test results from my and and for now she can follow-up with you on a regular basis. Of note, she appears to be depressed. I would recommend optimization of her depression medication, perhaps consideration of referral to mental health. She is advised to talk to about potentially seeing a nutritionist. She is advised to call 911 or have someone take her to the emergency room should she have acute onset of one-sided weakness, slurring of speech, droopy face, or any other acute/severe symptoms, such as chest pain, shortness of breath, severe headache. I answered all her questions today and she was in agreement with the plan. Thank you very much for allowing me to participate in the care of this nice patient. If I can be of any further assistance to you please do not hesitate to call me at [phone].  Sincerely,   Star Age, MD, PhD

## 2016-12-06 NOTE — Patient Instructions (Addendum)
For your episode of weakness and difficulty with your words, we will do further work up:  We will do a brain scan, called MRI and call you with the test results. We will have to schedule you for this on a separate date. This test requires authorization from your insurance, and we will take care of the insurance process. We will do a carotid Doppler ultrasound and call you with the test results. Likely, they will call to schedule after insurance authorizes the test. Most likely, you will go to [facility] for the test, but you may be able to choose another location if preferred.   Please start taking a baby aspirin (81 mg) daily, stop the BC powder.   I will also order an echocardiogram, we will call you with the results.   Please take care of your risk factors for stroke and heart disease:  This means: taking care of blood sugar values or diabetes management (A1c goal of less than 7.0), good blood pressure (hypertension) control and optimizing cholesterol management (with LDL goal of less than 70), exercising daily or regularly within your own mobility limitations of course.   Should you have any sudden onset of one-sided weakness, numbness, tingling, shortness of breath, chest pain, one-sided headache or severe headache, changes in mental state, please proceed to the ER or call 911 immediately.  If test results are within normal limits, we will have you follow up with your Primary Provider.

## 2016-12-09 ENCOUNTER — Other Ambulatory Visit: Payer: Self-pay | Admitting: Family

## 2016-12-11 ENCOUNTER — Other Ambulatory Visit: Payer: Self-pay | Admitting: Neurology

## 2016-12-11 ENCOUNTER — Telehealth: Payer: Self-pay | Admitting: Family

## 2016-12-11 DIAGNOSIS — R471 Dysarthria and anarthria: Secondary | ICD-10-CM

## 2016-12-11 DIAGNOSIS — M546 Pain in thoracic spine: Principal | ICD-10-CM

## 2016-12-11 DIAGNOSIS — R531 Weakness: Secondary | ICD-10-CM

## 2016-12-11 DIAGNOSIS — G8929 Other chronic pain: Secondary | ICD-10-CM

## 2016-12-11 MED ORDER — BACLOFEN 10 MG PO TABS: 10.0000 mg | ORAL_TABLET | Freq: Two times a day (BID) | ORAL | 4 refills | Status: DC

## 2016-12-11 NOTE — Telephone Encounter (Signed)
What is the name of the medication? baclofen  Have you contacted your pharmacy to request a refill? yes  Which pharmacy would you like this sent to? [facility]. She is out and needs this today.   Patient notified that their request is being sent to the clinical staff for review and that they should receive a call once it is complete. If they do not receive a call within 24 hours they can check with their pharmacy or our office.

## 2016-12-14 ENCOUNTER — Telehealth: Payer: Self-pay | Admitting: Family

## 2016-12-14 NOTE — Telephone Encounter (Signed)
What is the name of the medication? Triamcinolone ointment  Have you contacted your pharmacy to request a refill? No. She has not had it for a while  Which pharmacy would you like this sent to? [facility]   Patient notified that their request is being sent to the clinical staff for review and that they should receive a call once it is complete. If they do not receive a call within 24 hours they can check with their pharmacy or our office.

## 2016-12-15 ENCOUNTER — Other Ambulatory Visit: Payer: Self-pay | Admitting: *Deleted

## 2016-12-15 MED ORDER — TRIAMCINOLONE ACETONIDE 0.5 % EX OINT: 1.0000 "application " | TOPICAL_OINTMENT | Freq: Two times a day (BID) | CUTANEOUS | 0 refills | Status: DC

## 2016-12-15 NOTE — Telephone Encounter (Signed)
Prescription sent to pharmacy.

## 2016-12-19 ENCOUNTER — Other Ambulatory Visit: Payer: Self-pay

## 2016-12-19 ENCOUNTER — Ambulatory Visit (HOSPITAL_COMMUNITY): Payer: Medicare Other | Attending: Cardiovascular Disease

## 2016-12-19 DIAGNOSIS — R002 Palpitations: Secondary | ICD-10-CM | POA: Diagnosis not present

## 2016-12-19 DIAGNOSIS — R471 Dysarthria and anarthria: Secondary | ICD-10-CM | POA: Insufficient documentation

## 2016-12-19 DIAGNOSIS — R079 Chest pain, unspecified: Secondary | ICD-10-CM | POA: Diagnosis not present

## 2016-12-19 DIAGNOSIS — R531 Weakness: Secondary | ICD-10-CM | POA: Diagnosis not present

## 2016-12-19 LAB — ECHOCARDIOGRAM COMPLETE
AO mean calculated velocity dopler: 143 cm/s
AV Area VTI index: 0.69 cm2/m2
AV Area VTI: 1 cm2
AV Area mean vel: 1.02 cm2
AV Mean grad: 10 mmHg
AV Peak grad: 19 mmHg
AV VEL mean LVOT/AV: 0.51
AV area mean vel ind: 0.68 cm2/m2
AV peak Index: 0.66
AV pk vel: 219 cm/s
AV vel: 1.04
Ao pk vel: 0.5 m/s
E decel time: 134 ms
E/e' ratio: 8
FS: 37 % (ref 28–44)
IVS/LV PW RATIO, ED: 0.94
LA ID, A-P, ES: 33 mm
LA diam end sys: 33 mm
LA diam index: 2.19 cm/m2
LA vol A4C: 41 mL
LA vol index: 30.7 mL/m2
LA vol: 46.4 mL
LV E/e' medial: 8
LV E/e'average: 8
LV PW d: 8.34 mm — AB (ref 0.6–1.1)
LV e' LATERAL: 14.5 cm/s
LVOT SV: 52 mL
LVOT VTI: 26 cm
LVOT area: 2.01 cm2
LVOT diameter: 16 mm
LVOT peak VTI: 0.52 cm
LVOT peak vel: 109 cm/s
MV Dec: 134
MV Peak grad: 5 mmHg
MV pk A vel: 96 m/s
MV pk E vel: 116 m/s
Peak grad: 273 mmHg
RV sys press: 33 mmHg
Reg peak vel: 273 cm/s
TDI e' lateral: 14.5
TDI e' medial: 8.81
TR max vel: 273 cm/s
VTI: 50.4 cm
Valve area index: 0.69
Valve area: 1.04 cm2

## 2016-12-19 NOTE — Progress Notes (Signed)
Echocardiogram results were reviewed; overall good pump function and no sinister findings, reassuring, no further action required, please call patient to notify.  Taje Tondreau, MD, PhD [facility] ([facility]) 

## 2016-12-20 ENCOUNTER — Ambulatory Visit (HOSPITAL_COMMUNITY)
Admission: RE | Admit: 2016-12-20 | Discharge: 2016-12-20 | Disposition: A | Discharge status: Home / Self Care | Payer: Medicare Other | Source: Ambulatory Visit | Attending: Cardiovascular Disease | Admitting: Cardiovascular Disease

## 2016-12-20 ENCOUNTER — Telehealth: Payer: Self-pay | Admitting: *Deleted

## 2016-12-20 DIAGNOSIS — R531 Weakness: Secondary | ICD-10-CM | POA: Insufficient documentation

## 2016-12-20 DIAGNOSIS — R471 Dysarthria and anarthria: Secondary | ICD-10-CM | POA: Insufficient documentation

## 2016-12-20 DIAGNOSIS — I6523 Occlusion and stenosis of bilateral carotid arteries: Secondary | ICD-10-CM | POA: Insufficient documentation

## 2016-12-20 NOTE — Telephone Encounter (Signed)
Spoke with patient and informed her that her echocardiogram results were reviewed by Dr Rexene Alberts. Advised her that overall her heart has a  good pump function which is reassuring. Informed her that Dr Rexene Alberts stated there is no further action required. She stated she has carotid doppler test today. Advised her she will get a call when those results are available.  Patient verbalized understanding, appreciation.

## 2016-12-20 NOTE — Progress Notes (Signed)
Please advise patient that the recent carotid Doppler study which is the ultrasound of the main neck arteries, was negative for any significant stenosis, that is tightness or hardening of the arteries. No further action is required at this time of this test. Kaylena Pacifico, MD, PhD [facility]   

## 2016-12-21 ENCOUNTER — Other Ambulatory Visit: Payer: Self-pay | Admitting: Family

## 2016-12-21 ENCOUNTER — Other Ambulatory Visit: Payer: Medicare Other

## 2016-12-21 ENCOUNTER — Telehealth: Payer: Self-pay | Admitting: *Deleted

## 2016-12-21 ENCOUNTER — Ambulatory Visit: Payer: Medicare Other | Admitting: Pharmacist

## 2016-12-21 MED ORDER — ATORVASTATIN CALCIUM 40 MG PO TABS: ORAL_TABLET | ORAL | 1 refills | Status: DC

## 2016-12-21 NOTE — Telephone Encounter (Signed)
Attempted to reach patient with carotid doppler results. No answer, voice mailbox not set up.

## 2016-12-21 NOTE — Telephone Encounter (Signed)
Received call back from patient and informed her the recent carotid Doppler study which is the ultrasound of the main neck arteries, was negative for any significant stenosis, that is tightness or hardening of the arteries. Advised her that no further action is required at this time. She verbalized understanding, appreciation.

## 2016-12-26 ENCOUNTER — Other Ambulatory Visit: Payer: Self-pay | Admitting: Family

## 2017-01-04 ENCOUNTER — Ambulatory Visit (INDEPENDENT_AMBULATORY_CARE_PROVIDER_SITE_OTHER): Payer: Medicare Other | Admitting: Family

## 2017-01-04 ENCOUNTER — Encounter: Payer: Self-pay | Admitting: Family

## 2017-01-04 VITALS — BP 130/73 | HR 97 | Temp 98.8°F | Ht 61.5 in | Wt 118.8 lb

## 2017-01-04 DIAGNOSIS — R21 Rash and other nonspecific skin eruption: Secondary | ICD-10-CM | POA: Diagnosis not present

## 2017-01-04 DIAGNOSIS — M546 Pain in thoracic spine: Secondary | ICD-10-CM | POA: Diagnosis not present

## 2017-01-04 DIAGNOSIS — F331 Major depressive disorder, recurrent, moderate: Secondary | ICD-10-CM

## 2017-01-04 DIAGNOSIS — F411 Generalized anxiety disorder: Secondary | ICD-10-CM | POA: Diagnosis not present

## 2017-01-04 DIAGNOSIS — G8929 Other chronic pain: Secondary | ICD-10-CM

## 2017-01-04 MED ORDER — HYDROCODONE-ACETAMINOPHEN 7.5-325 MG PO TABS: 1.0000 | ORAL_TABLET | Freq: Three times a day (TID) | ORAL | 0 refills | Status: DC | PRN

## 2017-01-04 MED ORDER — HYDROCODONE-ACETAMINOPHEN 10-325 MG PO TABS: 1.0000 | ORAL_TABLET | Freq: Three times a day (TID) | ORAL | 0 refills | Status: DC | PRN

## 2017-01-04 MED ORDER — ALPRAZOLAM 0.5 MG PO TABS: 0.5000 mg | ORAL_TABLET | Freq: Two times a day (BID) | ORAL | 5 refills | Status: DC | PRN

## 2017-01-04 NOTE — Progress Notes (Signed)
Subjective:    Patient ID: Stacy Dalton, female    DOB: 03-05-1951, 66 y.o.   MRN: [id number]  Pt presents to the office today with complaints of rash and chronic back pain. Pt requesting a refill on her xanax today. States her GAD is stable as "long as I take my medications".   Rash  This is a new problem. The current episode started 1 to 4 weeks ago. The problem has been waxing and waning since onset. The affected locations include the face. The rash is characterized by redness and itchiness.  Back Pain  This is a chronic problem. The current episode started more than 1 year ago. The problem occurs intermittently. The problem has been waxing and waning since onset. The pain is present in the lumbar spine. The quality of the pain is described as aching. The pain does not radiate. The pain is at a severity of 9/10. The pain is moderate. The pain is worse during the night. The symptoms are aggravated by bending and lying down. Associated symptoms include weakness. Pertinent negatives include no bladder incontinence, bowel incontinence, dysuria or numbness. She has tried muscle relaxant, bed rest and analgesics for the symptoms. The treatment provided mild relief.      Review of Systems  Gastrointestinal: Negative for bowel incontinence.  Genitourinary: Negative for bladder incontinence and dysuria.  Musculoskeletal: Positive for back pain.  Skin: Positive for rash.  Neurological: Positive for weakness. Negative for numbness.  All other systems reviewed and are negative.      Objective:   Physical Exam  Constitutional: She is oriented to person, place, and time. She appears well-developed and well-nourished. No distress.  HENT:  Head: Normocephalic.  Eyes: Pupils are equal, round, and reactive to light.  Neck: Normal range of motion. Neck supple. No thyromegaly present.  Cardiovascular: Normal rate, regular rhythm, normal heart sounds and intact distal pulses.   No murmur  heard. Pulmonary/Chest: Effort normal and breath sounds normal. No respiratory distress. She has no wheezes.  Abdominal: Soft. Bowel sounds are normal. She exhibits no distension. There is no tenderness.  Musculoskeletal: Normal range of motion. She exhibits tenderness. She exhibits no edema.  Pain in lower back with flexion  Neurological: She is alert and oriented to person, place, and time.  Skin: Skin is warm and dry. There is erythema (rash on bilateral cheeks ).  Psychiatric: She has a normal mood and affect. Her behavior is normal. Judgment and thought content normal.  Vitals reviewed.    BP 130/73   Pulse 97   Temp 98.8 F (37.1 C) (Oral)   Ht 5' 1.5" (1.562 m)   Wt 118 lb 12.8 oz (53.9 kg)   BMI 22.08 kg/m      Assessment & Plan:  1. Chronic bilateral thoracic back pain - Ambulatory referral to Pain Clinic  2. Rash and nonspecific skin eruption  3. Moderate episode of recurrent major depressive disorder (HCC) - ALPRAZolam (XANAX) 0.5 MG tablet; Take 1 tablet (0.5 mg total) by mouth 2 (two) times daily as needed for anxiety.  Dispense: 60 tablet; Refill: 5  4. GAD (generalized anxiety disorder) - ALPRAZolam (XANAX) 0.5 MG tablet; Take 1 tablet (0.5 mg total) by mouth 2 (two) times daily as needed for anxiety.  Dispense: 60 tablet; Refill: 5   Long discussion with patient that I could not prescribe her pain medications any longer and we will do a referral to pain clinic. For the last 6 months pt has come  in every 2-4 weeks requesting her pain medication be switched from Ultram to Norco and vice versa. PT states the Ultram is not strong enough, but does not like the way the Noroc makes her feel. According to the [place] controlled Database pt has filled both prescriptions. She also continues to take her xanax and Restoril. PT told that in the future she may have to chose from the [place] and xanax. Pt is agreeable to follow up with Pain Management.  Evelina Dun, FNP

## 2017-01-04 NOTE — Patient Instructions (Signed)

## 2017-01-05 ENCOUNTER — Ambulatory Visit
Admission: RE | Admit: 2017-01-05 | Discharge: 2017-01-05 | Disposition: A | Discharge status: Home / Self Care | Payer: Medicare Other | Source: Ambulatory Visit | Attending: Neurology | Admitting: Neurology

## 2017-01-05 DIAGNOSIS — R531 Weakness: Secondary | ICD-10-CM

## 2017-01-05 DIAGNOSIS — R471 Dysarthria and anarthria: Secondary | ICD-10-CM

## 2017-01-05 DIAGNOSIS — R002 Palpitations: Secondary | ICD-10-CM

## 2017-01-05 DIAGNOSIS — R079 Chest pain, unspecified: Secondary | ICD-10-CM

## 2017-01-08 ENCOUNTER — Telehealth: Payer: Self-pay

## 2017-01-08 NOTE — Telephone Encounter (Signed)
-----   Message from Star Age, MD sent at 01/08/2017  7:39 AM EDT ----- Pls call patient: Recent brain MRI showed age appropriate findings, no significant volume loss, no evidence of old or recent stroke.  Overall reassuring. Can FU with PCP, as discussed.  Star Age, MD, PhD [facility])

## 2017-01-08 NOTE — Progress Notes (Signed)
Pls call patient: Recent brain MRI showed age appropriate findings, no significant volume loss, no evidence of old or recent stroke.  Overall reassuring. Can FU with PCP, as discussed.  Star Age, MD, PhD [facility])

## 2017-01-08 NOTE — Telephone Encounter (Signed)
I called pt. I advised pt that Dr. Rexene Alberts reviewed their MRI results and found the recent brain scan found age appropriate finds, no significant volume loss, no evidence of old or recent stroke. Dr. Rexene Alberts recommends that pt follow up with her PCP. Pt verbalized understanding of results. Pt had no questions at this time but was encouraged to call back if questions arise.

## 2017-01-09 ENCOUNTER — Telehealth: Payer: Self-pay | Admitting: Family

## 2017-01-09 NOTE — Telephone Encounter (Signed)
PT can cancel that appt

## 2017-01-10 ENCOUNTER — Telehealth: Payer: Self-pay | Admitting: Family

## 2017-01-10 NOTE — Telephone Encounter (Signed)
Patient aware that she does not have to come in and canceled appt.

## 2017-01-10 NOTE — Telephone Encounter (Signed)
FYI

## 2017-01-11 ENCOUNTER — Ambulatory Visit: Payer: Medicare Other | Admitting: Family

## 2017-01-12 MED ORDER — ONDANSETRON HCL 4 MG PO TABS: 4.0000 mg | ORAL_TABLET | Freq: Three times a day (TID) | ORAL | 0 refills | Status: DC | PRN

## 2017-01-12 NOTE — Telephone Encounter (Signed)
Zofran Prescription sent to pharmacy  ° °

## 2017-01-15 ENCOUNTER — Telehealth: Payer: Self-pay | Admitting: *Deleted

## 2017-01-15 NOTE — Telephone Encounter (Signed)
Pt aware.

## 2017-01-15 NOTE — Telephone Encounter (Signed)
Per pt Zofran does not work She would like Phenergan Please advise

## 2017-01-15 NOTE — Telephone Encounter (Signed)
I can not prescribe phenergan with the xanax, norco, and restoril. It is not safe.

## 2017-01-19 ENCOUNTER — Telehealth: Payer: Self-pay

## 2017-01-19 NOTE — Telephone Encounter (Signed)
Since she stopped her Norco and only take tramadol want to know if you still want to refer to Pain Clinic?

## 2017-01-19 NOTE — Telephone Encounter (Signed)
Pt is fine with going to pain clinic and is questioning when her appt with Pain Clinic will be?

## 2017-01-19 NOTE — Telephone Encounter (Signed)
Pt does still need to follow up with Pain Clinic. Pt's pain is not controlled on Ultram.

## 2017-01-26 ENCOUNTER — Other Ambulatory Visit: Payer: Self-pay | Admitting: Family

## 2017-01-29 ENCOUNTER — Encounter: Payer: Self-pay | Admitting: Family

## 2017-01-29 ENCOUNTER — Ambulatory Visit (INDEPENDENT_AMBULATORY_CARE_PROVIDER_SITE_OTHER): Payer: Medicare Other | Admitting: Family

## 2017-01-29 VITALS — BP 137/78 | HR 93 | Temp 98.1°F | Ht 61.5 in | Wt 118.0 lb

## 2017-01-29 DIAGNOSIS — G8929 Other chronic pain: Secondary | ICD-10-CM

## 2017-01-29 DIAGNOSIS — N3001 Acute cystitis with hematuria: Secondary | ICD-10-CM

## 2017-01-29 DIAGNOSIS — M545 Low back pain: Secondary | ICD-10-CM

## 2017-01-29 DIAGNOSIS — R350 Frequency of micturition: Secondary | ICD-10-CM

## 2017-01-29 LAB — MICROSCOPIC EXAMINATION
Renal Epithel, UA: NONE SEEN /hpf
WBC, UA: >30 /hpf — AB (ref 0–?)

## 2017-01-29 LAB — URINALYSIS, COMPLETE
Bilirubin, UA: NEGATIVE
Glucose, UA: NEGATIVE
Ketones, UA: NEGATIVE
Nitrite, UA: POSITIVE — AB
Specific Gravity, UA: 1.015 (ref 1.005–1.030)
Urobilinogen, Ur: 0.2 mg/dL (ref 0.2–1.0)
pH, UA: 7 (ref 5.0–7.5)

## 2017-01-29 MED ORDER — ONDANSETRON 4 MG PO TBDP: 4.0000 mg | ORAL_TABLET | Freq: Three times a day (TID) | ORAL | 0 refills | Status: DC | PRN

## 2017-01-29 MED ORDER — CIPROFLOXACIN HCL 500 MG PO TABS: 500.0000 mg | ORAL_TABLET | Freq: Two times a day (BID) | ORAL | 0 refills | Status: DC

## 2017-01-29 MED ORDER — HYDROCODONE-ACETAMINOPHEN 7.5-325 MG PO TABS: 1.0000 | ORAL_TABLET | Freq: Four times a day (QID) | ORAL | 0 refills | Status: DC | PRN

## 2017-01-29 NOTE — Patient Instructions (Signed)
Urinary Frequency, Adult Urinary frequency means urinating more often than usual. People with urinary frequency urinate at least 8 times in 24 hours, even if they drink a normal amount of fluid. Although they urinate more often than normal, the total amount of urine produced in a day may be normal. Urinary frequency is also called pollakiuria. What are the causes? This condition may be caused by:  A urinary tract infection.  Obesity.  Bladder problems, such as bladder stones.  Caffeine or alcohol.  Eating food or drinking fluids that irritate the bladder. These include coffee, tea, soda, artificial sweeteners, citrus, tomato-based foods, and chocolate.  Certain medicines, such as medicines that help the body get rid of extra fluid (diuretics).  Muscle or nerve weakness.  Overactive bladder.  Chronic diabetes.  Interstitial cystitis.  In men, problems with the prostate, such as an enlarged prostate.  In women, pregnancy.  In some cases, the cause may not be known. What increases the risk? This condition is more likely to develop in:  Women who have gone through menopause.  Men with prostate problems.  People with a disease or injury that affects the nerves or spinal cord.  People who have or have had a condition that affects the brain, such as a stroke.  What are the signs or symptoms? Symptoms of this condition include:  Feeling an urgent need to urinate often. The stress and anxiety of needing to find a bathroom quickly can make this urge worse.  Urinating 8 or more times in 24 hours.  Urinating as often as every 1 to 2 hours.  How is this diagnosed? This condition is diagnosed based on your symptoms, your medical history, and a physical exam. You may have tests, such as:  Blood tests.  Urine tests.  Imaging tests, such as X-rays or ultrasounds.  A bladder test.  A test of your neurological system. This is the body system that senses the need to  urinate.  A test to check for problems in the urethra and bladder called cystoscopy.  You may also be asked to keep a bladder diary. A bladder diary is a record of what you eat and drink, how often you urinate, and how much you urinate. You may need to see a health care provider who specializes in conditions of the urinary tract (urologist) or kidneys (nephrologist). How is this treated? Treatment for this condition depends on the cause. Sometimes the condition goes away on its own and treatment is not necessary. If treatment is needed, it may include:  Taking medicine.  Learning exercises that strengthen the muscles that help control urination.  Following a bladder training program. This may include: ? Learning to delay going to the bathroom. ? Double urinating (voiding). This helps if you are not completely emptying your bladder. ? Scheduled voiding.  Making diet changes, such as: ? Avoiding caffeine. ? Drinking fewer fluids, especially alcohol. ? Not drinking in the evening. ? Not having foods or drinks that may irritate the bladder. ? Eating foods that help prevent or ease constipation. Constipation can make this condition worse.  Having the nerves in your bladder stimulated. There are two options for stimulating the nerves to your bladder: ? Outpatient electrical nerve stimulation. This is done by your health care provider. ? Surgery to implant a bladder pacemaker. The pacemaker helps to control the urge to urinate.  Follow these instructions at home:  Keep a bladder diary if told to by your health care provider.  Take over-the-counter   and prescription medicines only as told by your health care provider.  Do any exercises as told by your health care provider.  Follow a bladder training program as told by your health care provider.  Make any recommended diet changes.  Keep all follow-up visits as told by your health care provider. This is important. Contact a health care  provider if:  You start urinating more often.  You feel pain or irritation when you urinate.  You notice blood in your urine.  Your urine looks cloudy.  You develop a fever.  You begin vomiting. Get help right away if:  You are unable to urinate. This information is not intended to replace advice given to you by your health care provider. Make sure you discuss any questions you have with your health care provider. Document Released: 04/15/2009 Document Revised: 07/21/2015 Document Reviewed: 01/13/2015 Elsevier Interactive Patient Education  2018 [employer]  

## 2017-01-29 NOTE — Progress Notes (Signed)
   Subjective:    Patient ID: Stacy Dalton, female    DOB: 1951/02/09, 66 y.o.   MRN: [id number]  Pt presents to the office today with UTI symptoms. Pt is requesting her Norco be decreased to 7.5/325 from 10/325 mg. PT has referral pending to Pain Clinic.  Dysuria   The current episode started yesterday. The problem occurs intermittently. The problem has been waxing and waning. The quality of the pain is described as burning. The pain is at a severity of 8/10. The pain is moderate. There has been no fever. Associated symptoms include flank pain, frequency, nausea and urgency. Pertinent negatives include no hematuria. She has tried acetaminophen and increased fluids for the symptoms. The treatment provided mild relief.      Review of Systems  Gastrointestinal: Positive for nausea.  Genitourinary: Positive for dysuria, flank pain, frequency and urgency. Negative for hematuria.  All other systems reviewed and are negative.      Objective:   Physical Exam  Constitutional: She is oriented to person, place, and time. She appears well-developed and well-nourished. No distress.  HENT:  Head: Normocephalic.  Eyes: Pupils are equal, round, and reactive to light.  Neck: Normal range of motion. Neck supple. No thyromegaly present.  Cardiovascular: Normal rate, regular rhythm, normal heart sounds and intact distal pulses.   No murmur heard. Pulmonary/Chest: Effort normal and breath sounds normal. No respiratory distress. She has no wheezes.  Abdominal: Soft. Bowel sounds are normal. She exhibits no distension. There is no tenderness.  Musculoskeletal: She exhibits no edema or tenderness.  Lower lumbar pain with flexion  Neurological: She is alert and oriented to person, place, and time.  Skin: Skin is warm and dry.  Psychiatric: She has a normal mood and affect. Her behavior is normal. Judgment and thought content normal.  Vitals reviewed.    BP 137/78   Pulse 93   Temp 98.1 F (36.7  C) (Oral)   Ht 5' 1.5" (1.562 m)   Wt 118 lb (53.5 kg)   BMI 21.93 kg/m      Assessment & Plan:  1. Urine frequency - Urinalysis, Complete - Urine Culture  2. Acute cystitis with hematuria Force fluids AZO over the counter X2 days RTO prn Culture pending - ciprofloxacin (CIPRO) 500 MG tablet; Take 1 tablet (500 mg total) by mouth 2 (two) times daily.  Dispense: 14 tablet; Refill: 0  3. Chronic low back pain, unspecified back pain laterality, with sciatica presence unspecified Will decrease Norco to 7.5/325 mg from 10/325 mg- I will place date that can not refill until 02/04/17.  I reviewed [place] controlled Database- Pt has continued to get Ultram refilled even though she has told me has not been taking. Will call pharmacy and cancel rx. Keep referral to Pain Clinic - HYDROcodone-acetaminophen (NORCO) 7.5-325 MG tablet; Take 1 tablet by mouth every 6 (six) hours as needed for moderate pain.  Dispense: 90 tablet; Refill: 0   Evelina Dun, FNP

## 2017-01-31 LAB — URINE CULTURE

## 2017-02-07 ENCOUNTER — Other Ambulatory Visit: Payer: Self-pay | Admitting: Family

## 2017-02-08 ENCOUNTER — Ambulatory Visit (INDEPENDENT_AMBULATORY_CARE_PROVIDER_SITE_OTHER): Payer: Medicare Other | Admitting: Family

## 2017-02-08 ENCOUNTER — Encounter: Payer: Self-pay | Admitting: Family

## 2017-02-08 ENCOUNTER — Telehealth: Payer: Self-pay | Admitting: Family

## 2017-02-08 VITALS — BP 127/81 | HR 100 | Temp 98.0°F | Ht 61.5 in | Wt 118.0 lb

## 2017-02-08 DIAGNOSIS — M546 Pain in thoracic spine: Secondary | ICD-10-CM | POA: Diagnosis not present

## 2017-02-08 DIAGNOSIS — G8929 Other chronic pain: Secondary | ICD-10-CM | POA: Diagnosis not present

## 2017-02-08 MED ORDER — PREDNISONE 10 MG (21) PO TBPK: ORAL_TABLET | ORAL | 0 refills | Status: DC

## 2017-02-08 MED ORDER — ONDANSETRON 4 MG PO TBDP: 4.0000 mg | ORAL_TABLET | Freq: Three times a day (TID) | ORAL | 0 refills | Status: DC | PRN

## 2017-02-08 NOTE — Telephone Encounter (Signed)
appt scheduled

## 2017-02-08 NOTE — Patient Instructions (Signed)

## 2017-02-08 NOTE — Progress Notes (Signed)
   Subjective:    Patient ID: Stacy Dalton, female    DOB: 1950/08/16, 66 y.o.   MRN: [id number]  PT presents to the office today with chronic back pain. Pt has been referred to a Pain Clinic, but could not be reached. We will get referral set up appointment today. Appt was made for 08/14.   PT states her pain is worse since switching her Noroc to 7.5 from 10 mg per pt's request because of nausea. PT was given zofran OTD and states this works better than the "regular zofran" and would like to try the Norco 10-325 mg.   [place] controlled database pt had Norc 7.5 filled on 01/29/17. Back Pain  This is a chronic problem. The current episode started more than 1 year ago. The problem occurs constantly. The problem has been waxing and waning since onset. The pain is present in the lumbar spine. The quality of the pain is described as aching. The pain does not radiate. The pain is at a severity of 9/10. The pain is moderate. The symptoms are aggravated by bending and sitting. Pertinent negatives include no bladder incontinence, bowel incontinence, leg pain, tingling or weakness. She has tried analgesics and bed rest for the symptoms. The treatment provided mild relief.      Review of Systems  Gastrointestinal: Negative for bowel incontinence.  Genitourinary: Negative for bladder incontinence.  Musculoskeletal: Positive for back pain.  Neurological: Negative for tingling and weakness.  All other systems reviewed and are negative.      Objective:   Physical Exam  Constitutional: She is oriented to person, place, and time. She appears well-developed and well-nourished. No distress.  HENT:  Head: Normocephalic.  Eyes: Pupils are equal, round, and reactive to light.  Neck: Normal range of motion. Neck supple. No thyromegaly present.  Cardiovascular: Normal rate, regular rhythm, normal heart sounds and intact distal pulses.   No murmur heard. Pulmonary/Chest: Effort normal and breath sounds  normal. No respiratory distress. She has no wheezes.  Abdominal: Soft. Bowel sounds are normal. She exhibits no distension. There is no tenderness.  Musculoskeletal: She exhibits no edema or tenderness.  Lower back pain with flexion or extension  Neurological: She is alert and oriented to person, place, and time.  Skin: Skin is warm and dry.  Psychiatric: She has a normal mood and affect. Her behavior is normal. Judgment and thought content normal.  Vitals reviewed.    BP 127/81   Pulse 100   Temp 98 F (36.7 C) (Oral)   Ht 5' 1.5" (1.562 m)   Wt 118 lb (53.5 kg)   BMI 21.93 kg/m      Assessment & Plan:  1. Chronic bilateral thoracic back pain Long discussion that I could not change controlled medications today Will order prednisone  ROM exercises discussed Pt to keep appt with Pain Clinic - predniSONE (STERAPRED UNI-PAK 21 TAB) 10 MG (21) TBPK tablet; Use as directed  Dispense: 21 tablet; Refill: 0 - ondansetron (ZOFRAN ODT) 4 MG disintegrating tablet; Take 1 tablet (4 mg total) by mouth every 8 (eight) hours as needed for nausea or vomiting.  Dispense: 30 tablet; Refill: 0   Evelina Dun, FNP

## 2017-02-13 DIAGNOSIS — M5135 Other intervertebral disc degeneration, thoracolumbar region: Secondary | ICD-10-CM | POA: Diagnosis not present

## 2017-02-13 DIAGNOSIS — M5137 Other intervertebral disc degeneration, lumbosacral region: Secondary | ICD-10-CM | POA: Diagnosis not present

## 2017-02-13 DIAGNOSIS — M545 Low back pain: Secondary | ICD-10-CM | POA: Diagnosis not present

## 2017-02-13 DIAGNOSIS — G894 Chronic pain syndrome: Secondary | ICD-10-CM | POA: Diagnosis not present

## 2017-02-13 DIAGNOSIS — M546 Pain in thoracic spine: Secondary | ICD-10-CM | POA: Diagnosis not present

## 2017-02-14 DIAGNOSIS — M5135 Other intervertebral disc degeneration, thoracolumbar region: Secondary | ICD-10-CM | POA: Diagnosis not present

## 2017-02-14 DIAGNOSIS — M545 Low back pain: Secondary | ICD-10-CM | POA: Diagnosis not present

## 2017-02-14 DIAGNOSIS — G894 Chronic pain syndrome: Secondary | ICD-10-CM | POA: Diagnosis not present

## 2017-02-14 DIAGNOSIS — M5137 Other intervertebral disc degeneration, lumbosacral region: Secondary | ICD-10-CM | POA: Diagnosis not present

## 2017-02-14 DIAGNOSIS — M546 Pain in thoracic spine: Secondary | ICD-10-CM | POA: Diagnosis not present

## 2017-02-19 ENCOUNTER — Ambulatory Visit (INDEPENDENT_AMBULATORY_CARE_PROVIDER_SITE_OTHER): Payer: Medicare Other | Admitting: Family

## 2017-02-19 ENCOUNTER — Encounter: Payer: Self-pay | Admitting: Family

## 2017-02-19 VITALS — BP 136/77 | HR 107 | Temp 99.0°F | Ht 61.5 in | Wt 122.0 lb

## 2017-02-19 DIAGNOSIS — F1120 Opioid dependence, uncomplicated: Secondary | ICD-10-CM

## 2017-02-19 DIAGNOSIS — G894 Chronic pain syndrome: Secondary | ICD-10-CM

## 2017-02-19 DIAGNOSIS — M546 Pain in thoracic spine: Secondary | ICD-10-CM

## 2017-02-19 DIAGNOSIS — G8929 Other chronic pain: Secondary | ICD-10-CM | POA: Diagnosis not present

## 2017-02-19 DIAGNOSIS — R1030 Lower abdominal pain, unspecified: Secondary | ICD-10-CM | POA: Diagnosis not present

## 2017-02-19 DIAGNOSIS — Z0289 Encounter for other administrative examinations: Secondary | ICD-10-CM

## 2017-02-19 LAB — MICROSCOPIC EXAMINATION
Bacteria, UA: NONE SEEN
RBC, UA: NONE SEEN /hpf (ref 0–?)
Renal Epithel, UA: NONE SEEN /hpf
WBC, UA: NONE SEEN /hpf (ref 0–?)

## 2017-02-19 LAB — URINALYSIS, COMPLETE
Bilirubin, UA: NEGATIVE
Glucose, UA: NEGATIVE
Ketones, UA: NEGATIVE
Leukocytes, UA: NEGATIVE
Nitrite, UA: NEGATIVE
Protein, UA: NEGATIVE
RBC, UA: NEGATIVE
Specific Gravity, UA: 1.01 (ref 1.005–1.030)
Urobilinogen, Ur: 0.2 mg/dL (ref 0.2–1.0)
pH, UA: 7.5 (ref 5.0–7.5)

## 2017-02-19 MED ORDER — TRAMADOL HCL 50 MG PO TABS: 50.0000 mg | ORAL_TABLET | Freq: Two times a day (BID) | ORAL | 1 refills | Status: DC | PRN

## 2017-02-19 NOTE — Progress Notes (Signed)
Subjective:    Patient ID: Stacy Dalton, female    DOB: 1951-04-06, 66 y.o.   MRN: [id number]  Abdominal Pain  This is a recurrent problem. The current episode started 1 to 4 weeks ago. The problem occurs intermittently. The problem has been waxing and waning. The pain is located in the suprapubic region. The pain is at a severity of 1/10. The pain is mild. The quality of the pain is aching. The abdominal pain does not radiate. Associated symptoms include dysuria and frequency. Associated symptoms comments: Dysuria . Nothing aggravates the pain.  Dysuria   This is a recurrent problem. The current episode started 1 to 4 weeks ago. The problem occurs intermittently. The problem has been waxing and waning. The quality of the pain is described as burning. The pain is at a severity of 2/10. The pain is mild. Associated symptoms include frequency.  Back Pain  This is a chronic problem. The current episode started more than 1 year ago. The problem occurs intermittently. The problem has been waxing and waning since onset. The pain is present in the lumbar spine. The quality of the pain is described as aching. The pain is at a severity of 6/10. The pain is moderate. Associated symptoms include abdominal pain and dysuria. Risk factors include sedentary lifestyle. She has tried analgesics and bed rest for the symptoms. The treatment provided mild relief.      Review of Systems  Gastrointestinal: Positive for abdominal pain.  Genitourinary: Positive for dysuria and frequency.  Musculoskeletal: Positive for back pain.  All other systems reviewed and are negative.      Objective:   Physical Exam  Constitutional: She is oriented to person, place, and time. She appears well-developed and well-nourished. No distress.  HENT:  Head: Normocephalic.  Cardiovascular: Normal rate, regular rhythm, normal heart sounds and intact distal pulses.   No murmur heard. Pulmonary/Chest: Effort normal and breath  sounds normal. No respiratory distress. She has no wheezes.  Abdominal: Soft. Bowel sounds are normal. She exhibits no distension. There is tenderness (lower abd tenderness).  Musculoskeletal: Normal range of motion. She exhibits tenderness. She exhibits no edema.  Lower back pain with flexion or extension   Neurological: She is alert and oriented to person, place, and time.  Skin: Skin is warm and dry.  Psychiatric: She has a normal mood and affect. Her behavior is normal. Judgment and thought content normal.  Vitals reviewed.   BP 136/77   Pulse (!) 107   Temp 99 F (37.2 C) (Oral)   Ht 5' 1.5" (1.562 m)   Wt 122 lb (55.3 kg)   BMI 22.68 kg/m      Assessment & Plan:  1. Lower abdominal pain - Urine Culture - Urinalysis, Complete - ToxASSURE Select 13 (MW), Urine  2. Pain medication agreement signed - traMADol (ULTRAM) 50 MG tablet; Take 1-2 tablets (50-100 mg total) by mouth every 12 (twelve) hours as needed.  Dispense: 120 tablet; Refill: 1 - ToxASSURE Select 13 (MW), Urine  3. Uncomplicated opioid dependence (HCC) - traMADol (ULTRAM) 50 MG tablet; Take 1-2 tablets (50-100 mg total) by mouth every 12 (twelve) hours as needed.  Dispense: 120 tablet; Refill: 1 - ToxASSURE Select 13 (MW), Urine  4. Chronic bilateral thoracic back pain - traMADol (ULTRAM) 50 MG tablet; Take 1-2 tablets (50-100 mg total) by mouth every 12 (twelve) hours as needed.  Dispense: 120 tablet; Refill: 1 - ToxASSURE Select 13 (MW), Urine  5. Chronic pain syndrome -  traMADol (ULTRAM) 50 MG tablet; Take 1-2 tablets (50-100 mg total) by mouth every 12 (twelve) hours as needed.  Dispense: 120 tablet; Refill: 1 - ToxASSURE Select 13 (MW), Urine  Long discussion, pt states she does not want to go to Pain CLinic. States she went and was unimpressed and had to wait 5 hours. Pt states she will take the Ultram and will not ask to switch to [place] pt we could give her Ultram today and I would not write  anything higher and this would have to last pt 30 days. If she wants something stronger she will need to follow up with Pain Clinic.  Pt has appt with chiropractor Urine negative, urine culture pending Drug screen pending  Evelina Dun, FNP

## 2017-02-19 NOTE — Patient Instructions (Signed)

## 2017-02-21 LAB — URINE CULTURE

## 2017-02-22 ENCOUNTER — Other Ambulatory Visit: Payer: Self-pay | Admitting: Family

## 2017-02-22 ENCOUNTER — Emergency Department (HOSPITAL_COMMUNITY): Payer: Medicare Other

## 2017-02-22 ENCOUNTER — Inpatient Hospital Stay (HOSPITAL_COMMUNITY)
Admission: EM | Admit: 2017-02-22 | Discharge: 2017-02-24 | DRG: 093 | Disposition: A | Discharge status: Home Health | Payer: Medicare Other | Attending: Internal Medicine | Admitting: Internal Medicine

## 2017-02-22 ENCOUNTER — Encounter (HOSPITAL_COMMUNITY): Payer: Self-pay

## 2017-02-22 ENCOUNTER — Inpatient Hospital Stay (HOSPITAL_COMMUNITY): Payer: Medicare Other

## 2017-02-22 DIAGNOSIS — Z87891 Personal history of nicotine dependence: Secondary | ICD-10-CM | POA: Diagnosis not present

## 2017-02-22 DIAGNOSIS — E039 Hypothyroidism, unspecified: Secondary | ICD-10-CM | POA: Diagnosis not present

## 2017-02-22 DIAGNOSIS — Z833 Family history of diabetes mellitus: Secondary | ICD-10-CM | POA: Diagnosis not present

## 2017-02-22 DIAGNOSIS — G928 Other toxic encephalopathy: Secondary | ICD-10-CM | POA: Diagnosis present

## 2017-02-22 DIAGNOSIS — G92 Toxic encephalopathy: Secondary | ICD-10-CM | POA: Diagnosis not present

## 2017-02-22 DIAGNOSIS — R404 Transient alteration of awareness: Secondary | ICD-10-CM | POA: Diagnosis not present

## 2017-02-22 DIAGNOSIS — E860 Dehydration: Secondary | ICD-10-CM | POA: Diagnosis not present

## 2017-02-22 DIAGNOSIS — I10 Essential (primary) hypertension: Secondary | ICD-10-CM | POA: Diagnosis present

## 2017-02-22 DIAGNOSIS — R4020 Unspecified coma: Secondary | ICD-10-CM | POA: Diagnosis not present

## 2017-02-22 DIAGNOSIS — R569 Unspecified convulsions: Secondary | ICD-10-CM | POA: Diagnosis present

## 2017-02-22 DIAGNOSIS — K219 Gastro-esophageal reflux disease without esophagitis: Secondary | ICD-10-CM | POA: Diagnosis present

## 2017-02-22 DIAGNOSIS — M8580 Other specified disorders of bone density and structure, unspecified site: Secondary | ICD-10-CM | POA: Diagnosis present

## 2017-02-22 DIAGNOSIS — M549 Dorsalgia, unspecified: Secondary | ICD-10-CM | POA: Diagnosis present

## 2017-02-22 DIAGNOSIS — Z8349 Family history of other endocrine, nutritional and metabolic diseases: Secondary | ICD-10-CM

## 2017-02-22 DIAGNOSIS — F419 Anxiety disorder, unspecified: Secondary | ICD-10-CM | POA: Diagnosis not present

## 2017-02-22 DIAGNOSIS — E785 Hyperlipidemia, unspecified: Secondary | ICD-10-CM | POA: Diagnosis not present

## 2017-02-22 DIAGNOSIS — J449 Chronic obstructive pulmonary disease, unspecified: Secondary | ICD-10-CM | POA: Diagnosis present

## 2017-02-22 DIAGNOSIS — Z885 Allergy status to narcotic agent status: Secondary | ICD-10-CM

## 2017-02-22 DIAGNOSIS — R4182 Altered mental status, unspecified: Secondary | ICD-10-CM | POA: Insufficient documentation

## 2017-02-22 DIAGNOSIS — G8929 Other chronic pain: Secondary | ICD-10-CM | POA: Diagnosis not present

## 2017-02-22 DIAGNOSIS — D72829 Elevated white blood cell count, unspecified: Secondary | ICD-10-CM | POA: Diagnosis present

## 2017-02-22 DIAGNOSIS — R32 Unspecified urinary incontinence: Secondary | ICD-10-CM | POA: Diagnosis present

## 2017-02-22 DIAGNOSIS — Z888 Allergy status to other drugs, medicaments and biological substances status: Secondary | ICD-10-CM

## 2017-02-22 DIAGNOSIS — R9401 Abnormal electroencephalogram [EEG]: Secondary | ICD-10-CM | POA: Diagnosis present

## 2017-02-22 DIAGNOSIS — T50995A Adverse effect of other drugs, medicaments and biological substances, initial encounter: Secondary | ICD-10-CM | POA: Diagnosis present

## 2017-02-22 DIAGNOSIS — Z825 Family history of asthma and other chronic lower respiratory diseases: Secondary | ICD-10-CM | POA: Diagnosis not present

## 2017-02-22 DIAGNOSIS — F329 Major depressive disorder, single episode, unspecified: Secondary | ICD-10-CM | POA: Diagnosis present

## 2017-02-22 DIAGNOSIS — F410 Panic disorder [episodic paroxysmal anxiety] without agoraphobia: Secondary | ICD-10-CM | POA: Diagnosis present

## 2017-02-22 DIAGNOSIS — E876 Hypokalemia: Secondary | ICD-10-CM | POA: Diagnosis present

## 2017-02-22 DIAGNOSIS — Z91041 Radiographic dye allergy status: Secondary | ICD-10-CM

## 2017-02-22 DIAGNOSIS — R42 Dizziness and giddiness: Secondary | ICD-10-CM | POA: Diagnosis not present

## 2017-02-22 DIAGNOSIS — G4700 Insomnia, unspecified: Secondary | ICD-10-CM | POA: Diagnosis present

## 2017-02-22 DIAGNOSIS — Z90710 Acquired absence of both cervix and uterus: Secondary | ICD-10-CM

## 2017-02-22 DIAGNOSIS — Z8249 Family history of ischemic heart disease and other diseases of the circulatory system: Secondary | ICD-10-CM | POA: Diagnosis not present

## 2017-02-22 DIAGNOSIS — G9341 Metabolic encephalopathy: Secondary | ICD-10-CM | POA: Diagnosis present

## 2017-02-22 DIAGNOSIS — Z79899 Other long term (current) drug therapy: Secondary | ICD-10-CM | POA: Diagnosis not present

## 2017-02-22 DIAGNOSIS — N390 Urinary tract infection, site not specified: Secondary | ICD-10-CM | POA: Diagnosis not present

## 2017-02-22 HISTORY — DX: Panic disorder (episodic paroxysmal anxiety): F41.0

## 2017-02-22 LAB — CBC WITH DIFFERENTIAL/PLATELET
Basophils Absolute: 0 10*3/uL (ref 0.0–0.1)
Basophils Relative: 0 %
Eosinophils Absolute: 0 10*3/uL (ref 0.0–0.7)
Eosinophils Relative: 0 %
HCT: 42.6 % (ref 36.0–46.0)
Hemoglobin: 13.9 g/dL (ref 12.0–15.0)
Lymphocytes Relative: 8 %
Lymphs Abs: 0.9 10*3/uL (ref 0.7–4.0)
MCH: 25.7 pg — ABNORMAL LOW (ref 26.0–34.0)
MCHC: 32.6 g/dL (ref 30.0–36.0)
MCV: 78.7 fL (ref 78.0–100.0)
Monocytes Absolute: 0.3 10*3/uL (ref 0.1–1.0)
Monocytes Relative: 3 %
Neutro Abs: 10.5 10*3/uL — ABNORMAL HIGH (ref 1.7–7.7)
Neutrophils Relative %: 89 %
Platelets: 245 10*3/uL (ref 150–400)
RBC: 5.41 MIL/uL — ABNORMAL HIGH (ref 3.87–5.11)
RDW: 20.8 % — ABNORMAL HIGH (ref 11.5–15.5)
WBC: 11.7 10*3/uL — ABNORMAL HIGH (ref 4.0–10.5)

## 2017-02-22 LAB — COMPREHENSIVE METABOLIC PANEL
ALT: 12 U/L — ABNORMAL LOW (ref 14–54)
AST: 32 U/L (ref 15–41)
Albumin: 4 g/dL (ref 3.5–5.0)
Alkaline Phosphatase: 71 U/L (ref 38–126)
Anion gap: 15 (ref 5–15)
BUN: 19 mg/dL (ref 6–20)
CO2: 27 mmol/L (ref 22–32)
Calcium: 9.6 mg/dL (ref 8.9–10.3)
Chloride: 97 mmol/L — ABNORMAL LOW (ref 101–111)
Creatinine, Ser: 1.07 mg/dL — ABNORMAL HIGH (ref 0.44–1.00)
GFR calc Af Amer: >60 mL/min (ref >60)
GFR calc non Af Amer: 52 mL/min — ABNORMAL LOW (ref >60)
Glucose, Bld: 124 mg/dL — ABNORMAL HIGH (ref 65–99)
Potassium: 3.8 mmol/L (ref 3.5–5.1)
Sodium: 139 mmol/L (ref 135–145)
Total Bilirubin: 0.7 mg/dL (ref 0.3–1.2)
Total Protein: 6.8 g/dL (ref 6.5–8.1)

## 2017-02-22 LAB — URINALYSIS, ROUTINE W REFLEX MICROSCOPIC
Bilirubin Urine: NEGATIVE
Glucose, UA: 50 mg/dL — AB
Hgb urine dipstick: NEGATIVE
Ketones, ur: 5 mg/dL — AB
Leukocytes, UA: NEGATIVE
Nitrite: NEGATIVE
Protein, ur: NEGATIVE mg/dL
Specific Gravity, Urine: 1.01 (ref 1.005–1.030)
pH: 6 (ref 5.0–8.0)

## 2017-02-22 LAB — RAPID URINE DRUG SCREEN, HOSP PERFORMED
Amphetamines: NOT DETECTED
Barbiturates: NOT DETECTED
Benzodiazepines: POSITIVE — AB
Cocaine: NOT DETECTED
Opiates: NOT DETECTED
Tetrahydrocannabinol: NOT DETECTED

## 2017-02-22 LAB — TOXASSURE SELECT 13 (MW), URINE

## 2017-02-22 LAB — I-STAT CG4 LACTIC ACID, ED
Lactic Acid, Venous: 2.65 mmol/L (ref 0.5–1.9)
Lactic Acid, Venous: 1.1 mmol/L (ref 0.5–1.9)

## 2017-02-22 LAB — ETHANOL: Alcohol, Ethyl (B): <5 mg/dL (ref <5)

## 2017-02-22 LAB — CBG MONITORING, ED: Glucose-Capillary: 136 mg/dL — ABNORMAL HIGH (ref 65–99)

## 2017-02-22 MED ORDER — ENOXAPARIN SODIUM 40 MG/0.4ML ~~LOC~~ SOLN
40.0000 mg | SUBCUTANEOUS | Status: DC
  Administered 2017-02-23: 40 mg via SUBCUTANEOUS
  Filled 2017-02-22: qty 0.4

## 2017-02-22 MED ORDER — NALOXONE HCL 0.4 MG/ML IJ SOLN
INTRAMUSCULAR | Status: AC
  Administered 2017-02-22: 0.4 mg
  Filled 2017-02-22: qty 1

## 2017-02-22 MED ORDER — LORAZEPAM 2 MG/ML IJ SOLN
1.0000 mg | Freq: Once | INTRAMUSCULAR | Status: AC
  Administered 2017-02-22: 1 mg via INTRAVENOUS
  Filled 2017-02-22: qty 1

## 2017-02-22 MED ORDER — SODIUM CHLORIDE 0.9 % IV SOLN
1000.0000 mg | Freq: Once | INTRAVENOUS | Status: AC
  Administered 2017-02-22: 1000 mg via INTRAVENOUS
  Filled 2017-02-22: qty 10

## 2017-02-22 MED ORDER — ACETAMINOPHEN 650 MG RE SUPP: 650.0000 mg | Freq: Four times a day (QID) | RECTAL | Status: DC | PRN

## 2017-02-22 MED ORDER — ACETAMINOPHEN 325 MG PO TABS
650.0000 mg | ORAL_TABLET | Freq: Four times a day (QID) | ORAL | Status: DC | PRN
  Administered 2017-02-24: 650 mg via ORAL
  Filled 2017-02-22: qty 2

## 2017-02-22 MED ORDER — LEVETIRACETAM 500 MG/5ML IV SOLN
500.0000 mg | Freq: Two times a day (BID) | INTRAVENOUS | Status: DC
  Administered 2017-02-22: 500 mg via INTRAVENOUS
  Filled 2017-02-22 (×2): qty 5

## 2017-02-22 MED ORDER — SODIUM CHLORIDE 0.9 % IV SOLN
INTRAVENOUS | Status: DC
  Administered 2017-02-22: 15:00:00 via INTRAVENOUS

## 2017-02-22 MED ORDER — LORAZEPAM 2 MG/ML IJ SOLN
0.5000 mg | Freq: Once | INTRAMUSCULAR | Status: AC
  Administered 2017-02-22: 0.5 mg via INTRAVENOUS
  Filled 2017-02-22: qty 1

## 2017-02-22 MED ORDER — NALOXONE HCL 4 MG/0.1ML NA LIQD: 1.0000 | Freq: Once | NASAL | Status: DC

## 2017-02-22 MED ORDER — SODIUM CHLORIDE 0.9 % IV BOLUS (SEPSIS)
1000.0000 mL | Freq: Once | INTRAVENOUS | Status: AC
  Administered 2017-02-22: 1000 mL via INTRAVENOUS

## 2017-02-22 MED ORDER — SODIUM CHLORIDE 0.9% FLUSH
3.0000 mL | Freq: Two times a day (BID) | INTRAVENOUS | Status: DC
  Administered 2017-02-23 (×2): 3 mL via INTRAVENOUS

## 2017-02-22 MED ORDER — LORAZEPAM BOLUS VIA INFUSION: 1.0000 mg | Freq: Once | INTRAVENOUS | Status: DC

## 2017-02-22 NOTE — ED Notes (Signed)
Pt family contacted this rn and spoke with edp regarding pt condition.

## 2017-02-22 NOTE — ED Notes (Signed)
ED Provider at bedside. 

## 2017-02-22 NOTE — ED Notes (Signed)
Lab just called this RN and the PT was not collected properly and needs to be recollected.

## 2017-02-22 NOTE — ED Provider Notes (Signed)
[facility] DEPT Provider Note   CSN: [id number] Arrival date & time: 02/22/17  0825     History   Chief Complaint Chief Complaint  Patient presents with  . Altered Mental Status    HPI Stacy Dalton is a 66 y.o. female who presents with AMS. PMH significant for COPD, anxiety/depression, HTN, chronic pain on Tramadol, hx of bowel perforation. All history was obtained by her daughter Amy over the phone. She states that last night the patient was having "dizzy spells" which are not abnormal for her. She was also mildly tremulous and her skin was cool and clammy although the patient complained of being hot. She had her lie down in bed to go to sleep. This morning at about 6AM her daughter tried to wake her up and the patient would just moan. Her daughter tried to question her to ask her if she knew who she was and she said "uh uh". She was moving all extremities and no weakness was noted. She was also incontinent of urine. EMS was called and vitals were normal. Of note, she has seen neurology due to a prior episode of weakness. A MRI brain in July was normal.  Of note, she saw her PCP on 8/20 and had a clean UA but did have a UTI 3 weeks ago.   Amy (daughter) - [phone] Legrand Como (son) - [phone]   HPI  Past Medical History:  Diagnosis Date  . Anxiety    panic attacks  . COPD (chronic obstructive pulmonary disease) ([place])    told has copd, no current inhaler use  . Depression   . GERD (gastroesophageal reflux disease)   . Hypertension   . Hypothyroidism   . Insomnia   . Migraine   . Migraine   . Osteopenia     Patient Active Problem List   Diagnosis Date Noted  . Pain medication agreement signed 10/10/2016  . Opioid dependence ([place]) 10/10/2016  . Status post colostomy takedown 04/12/2016  . Chronic constipation 12/03/2015  . Peritonitis with abscess of intestine ([place]) 11/29/2015  . COPD (chronic obstructive pulmonary disease) ([place]) 09/20/2015  . Osteopenia  06/10/2014  . Vitamin D deficiency disease 06/10/2014  . Hypothyroidism   . Depression 11/18/2012  . Insomnia 11/18/2012  . Chronic pain syndrome 11/18/2012  . HLD (hyperlipidemia) 09/01/2012  . GAD (generalized anxiety disorder) 09/01/2012    Class: Chronic  . S/P colostomy ([place]) 04/19/2012  . Normocytic anemia 10/15/2011  . Hypertension 10/13/2011  . Chronic back pain 10/13/2011    Past Surgical History:  Procedure Laterality Date  . ABDOMINAL HYSTERECTOMY    . bladder tac    . COLON SURGERY    . COLOSTOMY CLOSURE  04/19/2012   Procedure: COLOSTOMY CLOSURE;  Surgeon: Adin Hector, MD;  Location: [facility];  Service: General;  Laterality: N/A;  Laparotomy, Resection and Closure of Colostomy  . COLOSTOMY TAKEDOWN N/A 04/12/2016   Procedure: [place] TAKEDOWN;  Surgeon: Johnathan Hausen, MD;  Location: [facility];  Service: General;  Laterality: N/A;  . LAPAROTOMY  10/04/2011, colostomy also   Procedure: EXPLORATORY LAPAROTOMY;  Surgeon: Adin Hector, MD;  Location: [facility];  Service: General;  Laterality: N/A;  left partial colectomy with colostomy  . LAPAROTOMY  04/19/2012   Procedure: EXPLORATORY LAPAROTOMY;  Surgeon: Adin Hector, MD;  Location: [facility];  Service: General;  Laterality: N/A;  . LAPAROTOMY N/A 11/29/2015   Procedure: EXPLORATORY LAPAROTOMY; SUBTOTAL COLECTOMY WITH HARTMAN PROCEDURE AND END COLOSTOMY;  Surgeon: Johnathan Hausen,  MD;  Location: [facility];  Service: General;  Laterality: N/A;  . TUBAL LIGATION    . VENTRAL HERNIA REPAIR  04/19/2012   Procedure: HERNIA REPAIR VENTRAL ADULT;  Surgeon: Adin Hector, MD;  Location: [facility];  Service: General;  Laterality: N/A;    OB History    No data available       Home Medications    Prior to Admission medications   Medication Sig Start Date End Date Taking? Authorizing Provider  ALPRAZolam [name]) 0.5 MG tablet Take 1 tablet (0.5 mg total) by mouth 2 (two) times daily as needed for anxiety. 01/04/17   [name], FNP  atorvastatin (LIPITOR) 40 MG tablet TAKE ONE-HALF (1/2) TABLET DAILY (CHANGE IN DIRECTIONS) 12/21/16   [name], FNP  AZOR 10-40 MG tablet TAKE 1 TABLET DAILY 12/11/16   Evelina Dun A, FNP  baclofen (LIORESAL) 10 MG tablet Take 1 tablet (10 mg total) by mouth 2 (two) times daily. 12/11/16   Hawks, Christy A, FNP  COMBIVENT RESPIMAT 20-100 MCG/ACT AERS respimat Inhale 1 puff into the lungs every 6 (six) hours as needed for wheezing.  09/19/15   [provider]  docusate sodium (COLACE) 100 MG capsule Take 1 capsule (100 mg total) by mouth 2 (two) times daily as needed for mild constipation. 12/03/15   Nat Christen, PA-C  DULoxetine (CYMBALTA) 30 MG capsule TAKE 1 CAPSULE DAILY 12/26/16   Evelina Dun A, FNP  DULoxetine (CYMBALTA) 60 MG capsule TAKE 1 CAPSULE DAILY 09/13/16   [name], FNP  levothyroxine (SYNTHROID, LEVOTHROID) 50 MCG tablet TAKE 1 TABLET DAILY 12/04/16   Evelina Dun A, FNP  ondansetron (ZOFRAN ODT) 4 MG disintegrating tablet Take 1 tablet (4 mg total) by mouth every 8 (eight) hours as needed for nausea or vomiting. 02/08/17   Evelina Dun A, FNP  temazepam (RESTORIL) 30 MG capsule Take 1 capsule (30 mg total) by mouth at bedtime as needed for sleep. 11/20/16   [name], FNP  traMADol (ULTRAM) 50 MG tablet Take 1-2 tablets (50-100 mg total) by mouth every 12 (twelve) hours as needed. 02/19/17   Evelina Dun A, FNP  triamcinolone ointment (KENALOG) 0.5 % Apply 1 application topically 2 (two) times daily. 12/15/16   [name], FNP  triamterene-hydrochlorothiazide (DYAZIDE) 37.5-25 MG capsule TAKE 1 CAPSULE DAILY 01/26/17   [name], FNP  Vitamin D, Ergocalciferol, (DRISDOL) 50000 units CAPS capsule TAKE 1 CAPSULE EVERY 7 DAYS 04/24/16   [name], FNP    Family History Family History  Problem Relation Age of Onset  . Heart disease Mother   . Hypertension Mother   . Cancer Sister        sinus  . Diabetes Brother    . Heart disease Brother   . Thyroid disease Brother   . Cancer Sister        abdominal ?  . Thyroid disease Sister   . Cancer Other        GE junction adenocarcinoma  . Emphysema Father   . Heart disease Father   . Stroke Father   . Hypertension Father   . COPD Sister   . Thyroid disease Sister   . Thyroid disease Sister   . Diabetes Brother   . Thyroid disease Brother   . Thyroid disease Brother   . Hypertension Brother   . Post-traumatic stress disorder Brother   . Thyroid disease Brother   . Thyroid disease Brother   .  Hypertension Brother   . Transient ischemic attack Brother     Social History Social History  Substance Use Topics  . Smoking status: Former Smoker    Packs/day: 1.50    Years: 20.00    Types: Cigarettes    Quit date: 10/03/1997  . Smokeless tobacco: Never Used  . Alcohol use No     Allergies   Toradol [ketorolac tromethamine]; Demerol; Esgic [butalbital-apap-caffeine]; and Iodine   Review of Systems Review of Systems  Unable to perform ROS: Mental status change     Physical Exam Updated Vital Signs BP (!) 141/80   Pulse 68   Temp 98.3 F (36.8 C) (Rectal)   Resp 18   Wt 55.3 kg (122 lb)   SpO2 95%   BMI 22.68 kg/m   Physical Exam  Constitutional: She appears well-developed and well-nourished. She appears lethargic. She is sleeping. No distress.  Acutely altered, snoring and moaning in pain when touched  HENT:  Head: Normocephalic and atraumatic.  Eyes: Pupils are equal, round, and reactive to light. Conjunctivae are normal. Right eye exhibits no discharge. Left eye exhibits no discharge. No scleral icterus. Right pupil is round and reactive. Left pupil is round and reactive. Pupils are equal.  Neck: Normal range of motion.  Cardiovascular: Normal rate and regular rhythm.  Exam reveals no gallop and no friction rub.   No murmur heard. Pulmonary/Chest: Effort normal. No respiratory distress. She has no wheezes. She has rhonchi  (upper rhonchi). She has no rales. She exhibits no tenderness.  Abdominal: Soft. Bowel sounds are normal. She exhibits no distension and no mass. There is tenderness (tenderness in suprapubic area). There is no rebound and no guarding. No hernia.  Large well healed surgical scar  Neurological: She appears lethargic. She is disoriented. GCS eye subscore is 2. GCS verbal subscore is 4. GCS motor subscore is 5.  Skin: Skin is dry.  Cool skin  Psychiatric: She is noncommunicative.  Nursing note and vitals reviewed.    ED Treatments / Results  Labs (all labs ordered are listed, but only abnormal results are displayed) Labs Reviewed  COMPREHENSIVE METABOLIC PANEL - Abnormal; Notable for the following:       Result Value   Chloride 97 (*)    Glucose, Bld 124 (*)    Creatinine, Ser 1.07 (*)    ALT 12 (*)    GFR calc non Af Amer 52 (*)    All other components within normal limits  CBC WITH DIFFERENTIAL/PLATELET - Abnormal; Notable for the following:    WBC 11.7 (*)    RBC 5.41 (*)    MCH 25.7 (*)    RDW 20.8 (*)    Neutro Abs 10.5 (*)    All other components within normal limits  URINALYSIS, ROUTINE W REFLEX MICROSCOPIC - Abnormal; Notable for the following:    Color, Urine STRAW (*)    Glucose, UA 50 (*)    Ketones, ur 5 (*)    All other components within normal limits  CBG MONITORING, ED - Abnormal; Notable for the following:    Glucose-Capillary 136 (*)    All other components within normal limits  I-STAT CG4 LACTIC ACID, ED - Abnormal; Notable for the following:    Lactic Acid, Venous 2.65 (*)    All other components within normal limits  ETHANOL  RAPID URINE DRUG SCREEN, HOSP PERFORMED    EKG  EKG Interpretation  Date/Time:  Thursday February 22 2017 08:32:58 EDT Ventricular Rate:  68 PR  Interval:    QRS Duration: 90 QT Interval:  407 QTC Calculation: 433 R Axis:   79 Text Interpretation:  Sinus rhythm No significant change since last tracing Confirmed by Theotis Burrow [phone]) on 02/22/2017 10:23:24 AM       Radiology Ct Head Wo Contrast  Result Date: 02/22/2017 CLINICAL DATA:  Dizziness with altered mental status EXAM: CT HEAD WITHOUT CONTRAST TECHNIQUE: Contiguous axial images were obtained from the base of the skull through the vertex without intravenous contrast. COMPARISON:  Brain MRI January 05, 2017 FINDINGS: Brain: There is stable age related volume loss. Left lateral ventricle is slightly larger than right lateral ventricle, a stable finding that probably represents an anatomic variant. There is no intracranial mass, hemorrhage, extra-axial fluid collection, or midline shift. There is slight small vessel disease in the centra semiovale bilaterally. Elsewhere gray-white compartments appear normal. No well-defined acute infarct evident. Vascular: The left middle cerebral artery demonstrates higher attenuation than its counterpart on the right. This finding is concerning for earliest changes of hyperdense vessel on the left. There is no appreciable vascular calcification. Skull: Bony calvarium appears intact. Sinuses/Orbits: There is mucosal thickening in several ethmoid air cells bilaterally. Other visualized paranasal sinuses are clear. Orbits appear symmetric bilaterally. Other: Mastoid air cells are clear. IMPRESSION: 1. Subtle increased attenuation in the left middle cerebral artery compared to the right, best appreciable on axial slice 10 series 3. Question early hyperdense vessel which could be indicative of the earliest changes of a left middle cerebral artery infarct. Given this circumstance, correlation with MR including diffusion imaging advised. 2. Age related volume loss with slight periventricular small vessel disease. No intracranial mass, hemorrhage, or extra-axial fluid collection. No well-defined infarct currently present. 3.   Mucosal thickening in several ethmoid air cells noted. These results were called by telephone at the time of  interpretation on 02/22/2017 at 10:41 am to [facility] , who verbally acknowledged these results. Electronically Signed   By: Lowella Grip III M.D.   On: 02/22/2017 10:42   Dg Chest Port 1 View  Result Date: 02/22/2017 CLINICAL DATA:  Altered mental status. EXAM: PORTABLE CHEST 1 VIEW COMPARISON:  04/27/2015 FINDINGS: Patient is mildly rotated on this image. Fullness in the right hilum likely related to patient positioning. Otherwise, the lungs are clear. Heart size is normal. No acute bone abnormality. IMPRESSION: No acute chest findings. Right hilum is slightly prominent and probably secondary to the projection of the image from patient positioning. Electronically Signed   By: Markus Daft M.D.   On: 02/22/2017 09:14    Procedures Procedures (including critical care time)  Medications Ordered in ED Medications  naloxone (NARCAN) nasal spray 4 mg/0.1 mL (0 sprays Nasal Hold 02/22/17 0902)  0.9 %  sodium chloride infusion (not administered)  naloxone (NARCAN) 0.4 MG/ML injection (0.4 mg  Given 02/22/17 0901)  sodium chloride 0.9 % bolus 1,000 mL (0 mLs Intravenous Stopped 02/22/17 1016)  LORazepam (ATIVAN) injection 0.5 mg (0.5 mg Intravenous Given 02/22/17 1204)     Initial Impression / Assessment and Plan / ED Course  I have reviewed the triage vital signs and the nursing notes.  Pertinent labs & imaging results that were available during my care of the patient were reviewed by me and considered in my medical decision making (see chart for details).  66 year old presents with acute AMS. Vitals are normal. She is globally encephalopathic on exam. No focal neuro deficits were appreciated although neuro exam is difficulty.  Heart is regular rate and rhythm, lungs have some scattered rhonchi, and abdomen is soft. Will order labs and imaging and reassess. Narcan was given with no effect.  CBC is remarkable for mild leukocytosis. Suspect a degree of hemoconcentration. CMP is unremarkable. UA  has 50 glucose and 5 ketones. UDS is + for benzos. Initial lactic acid is 2.65. After 1 L of fluid repeat is 1.10. CXR is negative. CT shows possible infarct in the L MCA. Neurology consult placed.  Spoke with Dr. Rory Percy. He is not convinced this is a stroke. Will order MRI/MRA.   1:49 PM MRI and MRA are negative. Pt is still encephalopathic. Will admit for further workup. Spoke with Otis Dials NP who will admit to SDU.   Final Clinical Impressions(s) / ED Diagnoses   Final diagnoses:  Altered mental status, unspecified altered mental status type    New Prescriptions New Prescriptions   No medications on file     Iris Pert 02/22/17 1517    Little, Wenda Overland, MD 02/22/17 2024

## 2017-02-22 NOTE — Progress Notes (Signed)
Bedside EEG completed, results pending. 

## 2017-02-22 NOTE — ED Notes (Signed)
Patient transported to CT 

## 2017-02-22 NOTE — Consult Note (Addendum)
Neurology Consultation  Reason for Consult: Altered mental status, questionable dense left MCA sign on CT. Referring Physician: Dr. Rex Kras.  CC: Altered mental status  History is obtained from: Chart   HPI: Stacy Dalton is a 66 y.o. female  With a past medical history of anxiety, depression, panic attacks, hypertension, hyperlipidemia, migraines, insomnia and hypothyroidism, who was found to be confused this morning. She went to bed last night, that was when she was last seen normal. But even at that time, she was complaining of some dizziness. She also felt her skin to be clammy and was mildly tremulous. When she woke up this morning and her daughter tried to talk to her, she did not talk and she kept mouthing "uh" as a response to most questions. EMS was called and she was brought in to the ER for evaluation. There was also urinary incontinence. Her vitals in the ambulance as well as in the ER have stayed completely normal. She has had an MRI done in July, for episodic weakness and word finding difficulties, which was noted to not show an acute stroke or bleed, chronic microvascular changes only. She was given Narcan in ER without effect.  LKW: 8 PM on 02/21/2017 tpa given?: no,  Premorbid modified Rankin scale (mRS):  0-Completely asymptomatic and back to baseline post-stroke   ROS: Unable to obtain due to patient's mentation  Past Medical History:  Diagnosis Date  . Anxiety    panic attacks  . COPD (chronic obstructive pulmonary disease) ([place])    told has copd, no current inhaler use  . Depression   . GERD (gastroesophageal reflux disease)   . Hypertension   . Hypothyroidism   . Insomnia   . Migraine   . Migraine   . Osteopenia     Family History  Problem Relation Age of Onset  . Heart disease Mother   . Hypertension Mother   . Cancer Sister        sinus  . Diabetes Brother   . Heart disease Brother   . Thyroid disease Brother   . Cancer Sister         abdominal ?  . Thyroid disease Sister   . Cancer Other        GE junction adenocarcinoma  . Emphysema Father   . Heart disease Father   . Stroke Father   . Hypertension Father   . COPD Sister   . Thyroid disease Sister   . Thyroid disease Sister   . Diabetes Brother   . Thyroid disease Brother   . Thyroid disease Brother   . Hypertension Brother   . Post-traumatic stress disorder Brother   . Thyroid disease Brother   . Thyroid disease Brother   . Hypertension Brother   . Transient ischemic attack Brother     Social History:   reports that she quit smoking about 19 years ago. Her smoking use included Cigarettes. She has a 30.00 pack-year smoking history. She has never used smokeless tobacco. She reports that she does not drink alcohol or use drugs.  Medications  Current Facility-Administered Medications:  .  naloxone (NARCAN) nasal spray 4 mg/0.1 mL, 1 spray, Nasal, Once, Recardo Evangelist, PA-C, Stopped at 02/22/17 0240  Current Outpatient Prescriptions:  .  ALPRAZolam (XANAX) 0.5 MG tablet, Take 1 tablet (0.5 mg total) by mouth 2 (two) times daily as needed for anxiety., Disp: 60 tablet, Rfl: 5 .  atorvastatin (LIPITOR) 40 MG tablet, TAKE ONE-HALF (1/2) TABLET DAILY (CHANGE  IN DIRECTIONS), Disp: 90 tablet, Rfl: 1 .  AZOR 10-40 MG tablet, TAKE 1 TABLET DAILY, Disp: 90 tablet, Rfl: 1 .  baclofen (LIORESAL) 10 MG tablet, Take 1 tablet (10 mg total) by mouth 2 (two) times daily., Disp: 60 tablet, Rfl: 4 .  COMBIVENT RESPIMAT 20-100 MCG/ACT AERS respimat, Inhale 1 puff into the lungs every 6 (six) hours as needed for wheezing. , Disp: , Rfl:  .  docusate sodium (COLACE) 100 MG capsule, Take 1 capsule (100 mg total) by mouth 2 (two) times daily as needed for mild constipation., Disp: 10 capsule, Rfl: 0 .  DULoxetine (CYMBALTA) 30 MG capsule, TAKE 1 CAPSULE DAILY, Disp: 90 capsule, Rfl: 0 .  DULoxetine (CYMBALTA) 60 MG capsule, TAKE 1 CAPSULE DAILY, Disp: 90 capsule, Rfl: 0 .   levothyroxine (SYNTHROID, LEVOTHROID) 50 MCG tablet, TAKE 1 TABLET DAILY, Disp: 90 tablet, Rfl: 1 .  ondansetron (ZOFRAN ODT) 4 MG disintegrating tablet, Take 1 tablet (4 mg total) by mouth every 8 (eight) hours as needed for nausea or vomiting., Disp: 30 tablet, Rfl: 0 .  temazepam (RESTORIL) 30 MG capsule, Take 1 capsule (30 mg total) by mouth at bedtime as needed for sleep., Disp: 30 capsule, Rfl: 4 .  traMADol (ULTRAM) 50 MG tablet, Take 1-2 tablets (50-100 mg total) by mouth every 12 (twelve) hours as needed., Disp: 120 tablet, Rfl: 1 .  triamcinolone ointment (KENALOG) 0.5 %, Apply 1 application topically 2 (two) times daily., Disp: 60 g, Rfl: 0 .  triamterene-hydrochlorothiazide (DYAZIDE) 37.5-25 MG capsule, TAKE 1 CAPSULE DAILY, Disp: 90 capsule, Rfl: 1 .  Vitamin D, Ergocalciferol, (DRISDOL) 50000 units CAPS capsule, TAKE 1 CAPSULE EVERY 7 DAYS, Disp: 12 capsule, Rfl: 3   Exam: Current vital signs: BP 136/64   Pulse 82   Temp 98.3 F (36.8 C) (Rectal)   Resp 12   Wt 55.3 kg (122 lb)   SpO2 100%   BMI 22.68 kg/m  Vital signs in last 24 hours: Temp:  [98.3 F (36.8 C)] 98.3 F (36.8 C) (08/23 0831) Pulse Rate:  [68-82] 82 (08/23 1200) Resp:  [12-22] 12 (08/23 1200) BP: (135-150)/(64-80) 136/64 (08/23 1200) SpO2:  [95 %-100 %] 100 % (08/23 1200) Weight:  [55.3 kg (122 lb)] 55.3 kg (122 lb) (08/23 0832) Gen. exam: Patient is very sleepy, opens eyes to loud voice. In no acute distress. Vitals stable HEENT: Normocephalic, atraumatic, dry oral mucous membranes, clear nares, clear throat. CVS: S1-S2 heard, regular rate rhythm Respiratory: Chest clear to auscultation, saturating normally at room air Extremities: Warm well perfused Abdomen: Nondistended nontender Neurological exam Patient is sleepy, awakens to voice. Seems flustered when woken up suddenly. Initially did not follow commands. But on repeated coaching, was able to say her name. Speech is non-dysarthric. Unable to  get her to name, repeat or follow commands. Cranial nerves: Pupils are equal round reactive to light, visual fields full, extra ocular movements intact, face is symmetric. Motor exam: At least antigravity in all 4 extremities without focality. Normal tone. Normal bulk. Sensory exam: Actively withdraws all 4 extremities to noxious tinnitus. Deep tendon reflexes are 2+ all over, downgoing toes. Did not cooperate for coordination testing. Gait cannot be tested because the mental status as above.  NIHSS 1a Level of Conscious.: 1 1b LOC Questions: 2 1c LOC Commands:2  2 Best Gaze: 0 3 Visual: 0 4 Facial Palsy:0  5a Motor Arm - left:1  5b Motor Arm - Right:1  6a Motor Leg - Left: 1 6b  Motor Leg - Right:1  7 Limb Ataxia: 0 8 Sensory: 0 9 Best Language: 2 10 Dysarthria: 0 11 Extinct. and Inatten.: 0 TOTAL: 11   Labs I have reviewed labs in epic and the results pertinent to this consultation are:  CBC    Component Value Date/Time   WBC 11.7 (H) 02/22/2017 0847   RBC 5.41 (H) 02/22/2017 0847   HGB 13.9 02/22/2017 0847   HGB 9.3 (L) 12/05/2016 1245   HCT 42.6 02/22/2017 0847   HCT 29.4 (L) 12/05/2016 1245   PLT 245 02/22/2017 0847   PLT 427 (H) 12/05/2016 1245   MCV 78.7 02/22/2017 0847   MCV 70 (L) 12/05/2016 1245   MCH 25.7 (L) 02/22/2017 0847   MCHC 32.6 02/22/2017 0847   RDW 20.8 (H) 02/22/2017 0847   RDW 16.8 (H) 12/05/2016 1245   LYMPHSABS 0.9 02/22/2017 0847   LYMPHSABS 1.2 12/05/2016 1245   MONOABS 0.3 02/22/2017 0847   EOSABS 0.0 02/22/2017 0847   EOSABS 0.1 12/05/2016 1245   BASOSABS 0.0 02/22/2017 0847   BASOSABS 0.0 12/05/2016 1245    CMP     Component Value Date/Time   NA 139 02/22/2017 0847   NA 138 12/05/2016 1245   K 3.8 02/22/2017 0847   CL 97 (L) 02/22/2017 0847   CO2 27 02/22/2017 0847   GLUCOSE 124 (H) 02/22/2017 0847   BUN 19 02/22/2017 0847   BUN 22 12/05/2016 1245   CREATININE 1.07 (H) 02/22/2017 0847   CREATININE 0.79 11/06/2012 0851    CALCIUM 9.6 02/22/2017 0847   PROT 6.8 02/22/2017 0847   PROT 7.2 12/05/2016 1245   ALBUMIN 4.0 02/22/2017 0847   ALBUMIN 4.4 12/05/2016 1245   AST 32 02/22/2017 0847   ALT 12 (L) 02/22/2017 0847   ALKPHOS 71 02/22/2017 0847   BILITOT 0.7 02/22/2017 0847   BILITOT 0.2 12/05/2016 1245   GFRNONAA 52 (L) 02/22/2017 0847   GFRAA >60 02/22/2017 0847    Lipid Panel     Component Value Date/Time   CHOL 240 (H) 06/05/2016 1107   CHOL 172 11/06/2012 0851   TRIG 121 06/05/2016 1107   TRIG 135 09/23/2013 1111   TRIG 80 11/06/2012 0851   HDL 82 06/05/2016 1107   HDL 74 09/23/2013 1111   HDL 55 11/06/2012 0851   CHOLHDL 2.9 06/05/2016 1107   LDLCALC 134 (H) 06/05/2016 1107   LDLCALC 120 (H) 09/23/2013 1111   LDLCALC 101 (H) 11/06/2012 0851   Urinary toxicology screen positive for benzodiazepines   Imaging I have reviewed the images obtained:  CT-scan of the brain-no acute abnormality, final official read reports possible hyperdense left MCA, which I think might be just artifactual. Cannot obtain a CTA of the head and neck as she has dye allergy.  MRI examination of the brain-pending  Assessment:  66/F with PMH as above brought in for AMS and neurology is consulted after [facility] shows a possible dense LMCA. In my review, the hyperdense vessel might not be a true finding but rather an artifact. Her exam is suggestive of encephalopathy and is not focal otherwise. I recommended a stat CTA, which can not be done due to allergy. I have requested a stat MRI brain and MRA head w/o contrast to evaluate the brain structurally and to evaluate the cerebral vasculature. Based on my exam, I would place toxic encephalopathy as the first differential and later place stroke on the list.  Impression: Evaluate for stroke Toxic metabolic encephalopathy - due to polypharmacy (  Utox + for Benzo that is prescribed, but is also on Tramadol, which can depress mental status and will not show up on tox  screen)  Recommendations: Stat MRI brain and MRA brain w/o contrast Will consider an EEG, for a potential seizure as underlying diagnosis. Will attempt to obtain tramadol levels if possible. Further recs after imaging.   Amie Portland, MD [facility] [phone]  If 7pm to 7am, please call on call as listed on AMION.   Repeat exam-1350 hrs. Patient examined again. No change in her exam. Remains drowsy. Opens eyes to loud voice and noxious stimulus. MRI of the brain and MRA of the brain without contrast completed. No abnormality reported. I reviewed the images myself. Patent intracranial vessels. Poor signal in bilateral distal M1 segments on reformatted images reported by the radiologist possibly attributed to artifact from the direction of the flow.  I still strongly suspect a toxic metabolic encephalopathy most likely due to polypharmacy. I've taken the liberty to order a tramadol level, started always usually not picked up on a urinary tox screen.  Updated Recommendations -Obtain an EEG to evaluate for any underlying seizure -I have taken the liberty to order tramadol levels in her blood. It is a send out test. -Probably would require close watch in the stepdown unit. -No signs of underlying CNS infection, so no urgency for a spinal tap. If her mentation does not improve with time as she metabolizes her medication if that is the underlying cause of her altered mental status, we will consider spinal tap at that time.  We will continue to follow with you Please call with questions  Amie Portland, MD [facility] [phone]  If 7pm to 7am, please call on call as listed on AMION.   ADDENDUM AFTER EEG REPORT EEG report reviewed. Reported GPEDs. Given the exam with some stereotypic responses on mental status exam and evidence of generalized periodic epileptiform discharges, I decided to go ahead and treat this patient with 1 mg of Ativan followed by  loading with 1 g of Keppra IV. I will sign out to my colleague overnight to checkup on her exam overnight and plan to continue Keppra 500 mg twice a day starting tomorrow morning. Further recommendations based on clinical course and further testing.  -- Amie Portland, MD [facility] [phone]  If 7pm to 7am, please call on call as listed on AMION.

## 2017-02-22 NOTE — ED Notes (Signed)
Pt was on 3L [place], family is unsure if Pt is on home O2 but Pt does have COPD.  Pt was taken off  1 hour ago and has maintained SPO2 of 95%.

## 2017-02-22 NOTE — H&P (Signed)
History and Physical    STEPHANIA MACFARLANE RAQ:[id number] DOB: December 21, 1950 DOA: 02/22/2017   PCP: [name], FNP   Attending physician: Marily Memos  Patient coming from/Resides with: Private residence  Chief Complaint: Altered mental status  HPI: ARELIS NEUMEIER is a 66 y.o. female with medical history significant for chronic back pain as well as anxiety on Ultram and benzodiazepines, hypertension, dyslipidemia, hypothyroidism, reflux and history of migraines. History obtained from the chart as well as the EDP given patient unable to participate in family not at bedside at time of my exam. According to triage documentation EMS was called to the home because of reports of altered mental status. The patient complained of dizziness the night before which was not uncommon for her. This morning, the family was unable to awaken the patient. She been incontinent of a large amount of urine and apparently her clothing was saturated. In the ER her initial CT questioned a possible subacute CVA but MRI/MRA showed no significant abnormalities. Neurology was consulted and felt the patient was either having symptoms related to polypharmacy versus undetected seizure disorder. She will be admitted to the stepdown unit for further evaluation and treatment. Of note she had no response to Narcan given in the ER.  ED Course:  Vital Signs: BP 133/67   Pulse 77   Temp 98.3 F (36.8 C) (Rectal)   Resp (!) 21   Wt 55.3 kg (122 lb)   SpO2 96%   BMI 22.68 kg/m  PCXR: Neg CT head without contrast: Question of hyperdense vessel left middle cerebral artery area versus acute infarct MR/MRA head: No acute findings that would explain patient's presenting symptomatology Lab data: Sodium 139, potassium 3.8, chloride 97, CO2 27, glucose 124, BUN 19, creatinine 1.07, LFTs normal, initial lactic acid 2.65 down to 1.10 after given fluid challenge, white count 11,700 with neutrophils 89% and absolute feels 10.5%, hemoglobin  13.9, platelets 245,000, urinalysis unremarkable, ETOH < 5, UDS positive for benzodiazepines Medications and treatments: Narcan 0.4 mg IV 1, ns bolus 1 L, Ativan 0.5 mg IV 1  Review of Systems:  **Unable to obtain from patient-see history of present illness   Past Medical History:  Diagnosis Date  . Anxiety    panic attacks  . COPD (chronic obstructive pulmonary disease) ([facility])    told has copd, no current inhaler use  . Depression   . GERD (gastroesophageal reflux disease)   . Hypertension   . Hypothyroidism   . Insomnia   . Migraine   . Migraine   . Osteopenia     Past Surgical History:  Procedure Laterality Date  . ABDOMINAL HYSTERECTOMY    . bladder tac    . COLON SURGERY    . COLOSTOMY CLOSURE  04/19/2012   Procedure: COLOSTOMY CLOSURE;  Surgeon: Adin Hector, MD;  Location: [facility];  Service: General;  Laterality: N/A;  Laparotomy, Resection and Closure of Colostomy  . COLOSTOMY TAKEDOWN N/A 04/12/2016   Procedure: [place] TAKEDOWN;  Surgeon: Johnathan Hausen, MD;  Location: [facility];  Service: General;  Laterality: N/A;  . LAPAROTOMY  10/04/2011, colostomy also   Procedure: EXPLORATORY LAPAROTOMY;  Surgeon: Adin Hector, MD;  Location: [facility];  Service: General;  Laterality: N/A;  left partial colectomy with colostomy  . LAPAROTOMY  04/19/2012   Procedure: EXPLORATORY LAPAROTOMY;  Surgeon: Adin Hector, MD;  Location: [facility];  Service: General;  Laterality: N/A;  . LAPAROTOMY N/A 11/29/2015   Procedure: EXPLORATORY LAPAROTOMY; SUBTOTAL COLECTOMY WITH HARTMAN  PROCEDURE AND END COLOSTOMY;  Surgeon: Johnathan Hausen, MD;  Location: [facility];  Service: General;  Laterality: N/A;  . TUBAL LIGATION    . VENTRAL HERNIA REPAIR  04/19/2012   Procedure: HERNIA REPAIR VENTRAL ADULT;  Surgeon: Adin Hector, MD;  Location: [facility];  Service: General;  Laterality: N/A;    Social History   Social History  . Marital status: Widowed    Spouse name: N/A  . Number of  children: N/A  . Years of education: N/A   Occupational History  . Not on file.   Social History Main Topics  . Smoking status: Former Smoker    Packs/day: 1.50    Years: 20.00    Types: Cigarettes    Quit date: 10/03/1997  . Smokeless tobacco: Never Used  . Alcohol use No  . Drug use: No  . Sexual activity: No   Other Topics Concern  . Not on file   Social History Narrative   married    Mobility: Independent Work history: Disabled vs retired   Allergies  Allergen Reactions  . Toradol [Ketorolac Tromethamine] Shortness Of Breath  . Demerol Swelling  . Esgic [Butalbital-Apap-Caffeine] Other (See Comments)    jittery  . Iodine Other (See Comments)    bp bottomed out per pt several hours later; unsure if pre medicated in past with cm; done in [place]    Family History  Problem Relation Age of Onset  . Heart disease Mother   . Hypertension Mother   . Cancer Sister        sinus  . Diabetes Brother   . Heart disease Brother   . Thyroid disease Brother   . Cancer Sister        abdominal ?  . Thyroid disease Sister   . Cancer Other        GE junction adenocarcinoma  . Emphysema Father   . Heart disease Father   . Stroke Father   . Hypertension Father   . COPD Sister   . Thyroid disease Sister   . Thyroid disease Sister   . Diabetes Brother   . Thyroid disease Brother   . Thyroid disease Brother   . Hypertension Brother   . Post-traumatic stress disorder Brother   . Thyroid disease Brother   . Thyroid disease Brother   . Hypertension Brother   . Transient ischemic attack Brother      Prior to Admission medications   Medication Sig Start Date End Date Taking? Authorizing Provider  ALPRAZolam [name]) 0.5 MG tablet Take 1 tablet (0.5 mg total) by mouth 2 (two) times daily as needed for anxiety. 01/04/17  Yes Hawks, Christy A, FNP  atorvastatin (LIPITOR) 40 MG tablet TAKE ONE-HALF (1/2) TABLET DAILY (CHANGE IN DIRECTIONS) Patient taking differently: Take 20 mg  by mouth daily. (CHANGE IN DIRECTIONS) 12/21/16  Yes Hawks, Christy A, FNP  AZOR 10-40 MG tablet TAKE 1 TABLET DAILY Patient taking differently: TAKE 1 TABLET BY MOUTH DAILY 12/11/16  Yes Hawks, Christy A, FNP  baclofen (LIORESAL) 10 MG tablet Take 1 tablet (10 mg total) by mouth 2 (two) times daily. 12/11/16  Yes Hawks, Christy A, FNP  DULoxetine (CYMBALTA) 30 MG capsule TAKE 1 CAPSULE DAILY Patient taking differently: TAKE 1 CAPSULE (30 MG) BY MOUTH DAILY 12/26/16  Yes Hawks, Christy A, FNP  levothyroxine (SYNTHROID, LEVOTHROID) 50 MCG tablet TAKE 1 TABLET DAILY Patient taking differently: TAKE 1 TABLET (50 MCG) BY MOUTH DAILY 12/04/16  Yes Hawks, [place] A,  FNP  ondansetron (ZOFRAN ODT) 4 MG disintegrating tablet Take 1 tablet (4 mg total) by mouth every 8 (eight) hours as needed for nausea or vomiting. 02/08/17  Yes Hawks, Christy A, FNP  temazepam (RESTORIL) 30 MG capsule Take 1 capsule (30 mg total) by mouth at bedtime as needed for sleep. 11/20/16  Yes Hawks, Christy A, FNP  traMADol (ULTRAM) 50 MG tablet Take 1-2 tablets (50-100 mg total) by mouth every 12 (twelve) hours as needed. Patient taking differently: Take 50-100 mg by mouth every 12 (twelve) hours as needed (pain).  02/19/17  Yes Hawks, Christy A, FNP  triamterene-hydrochlorothiazide (DYAZIDE) 37.5-25 MG capsule TAKE 1 CAPSULE DAILY Patient taking differently: TAKE 1 CAPSULE BY MOUTH DAILY 01/26/17  Yes Hawks, Christy A, FNP  Vitamin D, Ergocalciferol, (DRISDOL) 50000 units CAPS capsule TAKE 1 CAPSULE EVERY 7 DAYS Patient taking differently: TAKE 1 CAPSULE (50000 UNITS) BY MOUTH EVERY 7 DAYS 04/24/16  Yes Hawks, Christy A, FNP  docusate sodium (COLACE) 100 MG capsule Take 1 capsule (100 mg total) by mouth 2 (two) times daily as needed for mild constipation. 12/03/15   Nat Christen, PA-C  DULoxetine (CYMBALTA) 60 MG capsule TAKE 1 CAPSULE DAILY Patient not taking: Reported on 02/22/2017 09/13/16   [name], FNP    HYDROcodone-acetaminophen (NORCO) 7.5-325 MG tablet Take 1 tablet by mouth every 6 (six) hours as needed. 01/29/17   [provider]  Ipratropium-Albuterol (COMBIVENT RESPIMAT) 20-100 MCG/ACT AERS respimat Inhale 1 puff into the lungs every 6 (six) hours as needed for wheezing.    [provider]  triamcinolone ointment (KENALOG) 0.5 % Apply 1 application topically 2 (two) times daily. 12/15/16   [name], FNP    Physical Exam: Vitals:   02/22/17 1115 02/22/17 1200 02/22/17 1330 02/22/17 1400  BP: 135/80 136/64 139/63 133/67  Pulse: 71 82 69 77  Resp: 18 12 20  (!) 21  Temp:      TempSrc:      SpO2: 100% 100% 95% 96%  Weight:          Constitutional: NAD, Lethargic, appears to be comfortable Eyes: PERRL, lids and conjunctivae normal ENMT: Mucous membranes are dry. Posterior pharynx clear of any exudate or lesions. Neck: normal, supple, no masses, no thyromegaly Respiratory: clear to auscultation bilaterally, no wheezing, no crackles. Normal respiratory effort. No accessory muscle use.  Cardiovascular: Regular rate and rhythm, no murmurs / rubs / gallops. No extremity edema. 2+ pedal pulses. No carotid bruits.  Abdomen: no obvious tenderness, no masses palpated. No hepatosplenomegaly. Bowel sounds positive.  Musculoskeletal: no clubbing / cyanosis. No joint deformity upper and lower extremities. Good ROM, no contractures. Normal muscle tone.  Skin: no rashes, lesions, ulcers. No induration Neurologic: CN 2-12 grossly intact based on gross visual inspection. Sensation intact noting patient did briefly awaken to deep sternal rub, DTR normal. Unable to test  Psychiatric: Appears to be sedated/lethargic-briefly wakened to deep sternal rub with incomprehensible moaning but quickly back to lethargic state   Labs on Admission: I have personally reviewed following labs and imaging studies  CBC:  Recent Labs Lab 02/22/17 0847  WBC 11.7*  NEUTROABS 10.5*  HGB  13.9  HCT 42.6  MCV 78.7  PLT 761   Basic Metabolic Panel:  Recent Labs Lab 02/22/17 0847  NA 139  K 3.8  CL 97*  CO2 27  GLUCOSE 124*  BUN 19  CREATININE 1.07*  CALCIUM 9.6   GFR: Estimated Creatinine Clearance: 38.9 mL/min (A) (by  C-G formula based on SCr of 1.07 mg/dL (H)). Liver Function Tests:  Recent Labs Lab 02/22/17 0847  AST 32  ALT 12*  ALKPHOS 71  BILITOT 0.7  PROT 6.8  ALBUMIN 4.0   No results for input(s): LIPASE, AMYLASE in the last 168 hours. No results for input(s): AMMONIA in the last 168 hours. Coagulation Profile: No results for input(s): INR, PROTIME in the last 168 hours. Cardiac Enzymes: No results for input(s): CKTOTAL, CKMB, CKMBINDEX, TROPONINI in the last 168 hours. BNP (last 3 results) No results for input(s): PROBNP in the last 8760 hours. HbA1C: No results for input(s): HGBA1C in the last 72 hours. CBG:  Recent Labs Lab 02/22/17 0831  GLUCAP 136*   Lipid Profile: No results for input(s): CHOL, HDL, LDLCALC, TRIG, CHOLHDL, LDLDIRECT in the last 72 hours. Thyroid Function Tests: No results for input(s): TSH, T4TOTAL, FREET4, T3FREE, THYROIDAB in the last 72 hours. Anemia Panel: No results for input(s): VITAMINB12, FOLATE, FERRITIN, TIBC, IRON, RETICCTPCT in the last 72 hours. Urine analysis:    Component Value Date/Time   COLORURINE STRAW (A) 02/22/2017 1014   APPEARANCEUR CLEAR 02/22/2017 1014   APPEARANCEUR Clear 02/19/2017 1602   LABSPEC 1.010 02/22/2017 1014   PHURINE 6.0 02/22/2017 1014   GLUCOSEU 50 (A) 02/22/2017 1014   HGBUR NEGATIVE 02/22/2017 1014   BILIRUBINUR NEGATIVE 02/22/2017 1014   BILIRUBINUR Negative 02/19/2017 1602   KETONESUR 5 (A) 02/22/2017 1014   PROTEINUR NEGATIVE 02/22/2017 1014   UROBILINOGEN negative 07/01/2015 1541   UROBILINOGEN 0.2 10/07/2012 1928   NITRITE NEGATIVE 02/22/2017 1014   LEUKOCYTESUR NEGATIVE 02/22/2017 1014   LEUKOCYTESUR Negative 02/19/2017 1602   Sepsis  Labs: @LABRCNTIP (procalcitonin:4,lacticidven:4) ) Recent Results (from the past 240 hour(s))  Urine Culture     Status: None   Collection Time: 02/19/17  4:02 PM  Result Value Ref Range Status   Urine Culture, Routine Final report  Final   Organism ID, Bacteria Comment  Final    Comment: Culture shows less than 10,000 colony forming units of bacteria per milliliter of urine. This colony count is not generally considered to be clinically significant.   Microscopic Examination     Status: None   Collection Time: 02/19/17  4:02 PM  Result Value Ref Range Status   WBC, UA None seen 0 - 5 /hpf Final   RBC, UA None seen 0 - 2 /hpf Final   Epithelial Cells (non renal) 0-10 0 - 10 /hpf Final   Renal Epithel, UA None seen None seen /hpf Final   Bacteria, UA None seen None seen/Few Final     Radiological Exams on Admission: Ct Head Wo Contrast  Result Date: 02/22/2017 CLINICAL DATA:  Dizziness with altered mental status EXAM: CT HEAD WITHOUT CONTRAST TECHNIQUE: Contiguous axial images were obtained from the base of the skull through the vertex without intravenous contrast. COMPARISON:  Brain MRI January 05, 2017 FINDINGS: Brain: There is stable age related volume loss. Left lateral ventricle is slightly larger than right lateral ventricle, a stable finding that probably represents an anatomic variant. There is no intracranial mass, hemorrhage, extra-axial fluid collection, or midline shift. There is slight small vessel disease in the centra semiovale bilaterally. Elsewhere gray-white compartments appear normal. No well-defined acute infarct evident. Vascular: The left middle cerebral artery demonstrates higher attenuation than its counterpart on the right. This finding is concerning for earliest changes of hyperdense vessel on the left. There is no appreciable vascular calcification. Skull: Bony calvarium appears intact. Sinuses/Orbits: There is  mucosal thickening in several ethmoid air cells  bilaterally. Other visualized paranasal sinuses are clear. Orbits appear symmetric bilaterally. Other: Mastoid air cells are clear. IMPRESSION: 1. Subtle increased attenuation in the left middle cerebral artery compared to the right, best appreciable on axial slice 10 series 3. Question early hyperdense vessel which could be indicative of the earliest changes of a left middle cerebral artery infarct. Given this circumstance, correlation with MR including diffusion imaging advised. 2. Age related volume loss with slight periventricular small vessel disease. No intracranial mass, hemorrhage, or extra-axial fluid collection. No well-defined infarct currently present. 3.   Mucosal thickening in several ethmoid air cells noted. These results were called by telephone at the time of interpretation on 02/22/2017 at 10:41 am to [facility] , who verbally acknowledged these results. Electronically Signed   By: Lowella Grip III M.D.   On: 02/22/2017 10:42   Mr Jodene Nam Head Wo Contrast  Result Date: 02/22/2017 CLINICAL DATA:  Altered level of consciousness. Dizziness last night. EXAM: MRI HEAD WITHOUT CONTRAST MRA HEAD WITHOUT CONTRAST TECHNIQUE: Multiplanar, multiecho pulse sequences of the brain and surrounding structures were obtained without intravenous contrast. Angiographic images of the head were obtained using MRA technique without contrast. COMPARISON:  Brain MRI 01/05/2017 FINDINGS: MRI HEAD FINDINGS Brain: No acute infarction, hemorrhage, hydrocephalus, extra-axial collection or mass lesion. Symmetric mildly increased FLAIR signal along the lower midline frontal lobes and hypothalamic region is attributed artifact from sphenoid sinuses given the axial and coronal T2 weighted appearance and lack of mass effect. Mild FLAIR hyperintensity in the cerebral white matter attributed to chronic microvascular ischemia given patient's vascular risk factors, stable and age congruent. Vascular: Arterial findings below.  Normal dural venous sinus flow voids. Skull and upper cervical spine: Negative for marrow lesion. Asymmetric prominent joint fluid on the right atlanto occipital and C1-2 facets. Sinuses/Orbits: Negative MRA HEAD FINDINGS Symmetric carotid arteries and branching. There are bilateral fetal type PCAs with small vertebrobasilar system. Vessels are smooth and widely patent. Negative for aneurysm. Apparent poor signal in bilateral distal M1 segments on reformats is attributed to artifact from direction of flow IMPRESSION: Brain MRI: No acute finding or explanation for symptoms. Stable compared to 01/05/2017. Intracranial MRA: Negative.  Left MCA findings on prior CT were artifactual. Electronically Signed   By: Monte Fantasia M.D.   On: 02/22/2017 13:29   Mr Brain Wo Contrast  Result Date: 02/22/2017 CLINICAL DATA:  Altered level of consciousness. Dizziness last night. EXAM: MRI HEAD WITHOUT CONTRAST MRA HEAD WITHOUT CONTRAST TECHNIQUE: Multiplanar, multiecho pulse sequences of the brain and surrounding structures were obtained without intravenous contrast. Angiographic images of the head were obtained using MRA technique without contrast. COMPARISON:  Brain MRI 01/05/2017 FINDINGS: MRI HEAD FINDINGS Brain: No acute infarction, hemorrhage, hydrocephalus, extra-axial collection or mass lesion. Symmetric mildly increased FLAIR signal along the lower midline frontal lobes and hypothalamic region is attributed artifact from sphenoid sinuses given the axial and coronal T2 weighted appearance and lack of mass effect. Mild FLAIR hyperintensity in the cerebral white matter attributed to chronic microvascular ischemia given patient's vascular risk factors, stable and age congruent. Vascular: Arterial findings below. Normal dural venous sinus flow voids. Skull and upper cervical spine: Negative for marrow lesion. Asymmetric prominent joint fluid on the right atlanto occipital and C1-2 facets. Sinuses/Orbits: Negative MRA HEAD  FINDINGS Symmetric carotid arteries and branching. There are bilateral fetal type PCAs with small vertebrobasilar system. Vessels are smooth and widely patent. Negative for aneurysm. Apparent poor  signal in bilateral distal M1 segments on reformats is attributed to artifact from direction of flow IMPRESSION: Brain MRI: No acute finding or explanation for symptoms. Stable compared to 01/05/2017. Intracranial MRA: Negative.  Left MCA findings on prior CT were artifactual. Electronically Signed   By: Monte Fantasia M.D.   On: 02/22/2017 13:29   Dg Chest Port 1 View  Result Date: 02/22/2017 CLINICAL DATA:  Altered mental status. EXAM: PORTABLE CHEST 1 VIEW COMPARISON:  04/27/2015 FINDINGS: Patient is mildly rotated on this image. Fullness in the right hilum likely related to patient positioning. Otherwise, the lungs are clear. Heart size is normal. No acute bone abnormality. IMPRESSION: No acute chest findings. Right hilum is slightly prominent and probably secondary to the projection of the image from patient positioning. Electronically Signed   By: Markus Daft M.D.   On: 02/22/2017 09:14    EKG: (Independently reviewed) sinus rhythm with ventricular rate 60 bpm, QTC 433 ms, normal regurgitation, no acute ischemic changes  Assessment/Plan Principal Problem:   Toxic metabolic encephalopathy -Patient presents with altered mental status without evidence of acute CVA -Not significantly hypertensive so not consistent with PRES -Appreciate neurology assistance; they have postulated either undetected seizures versus polypharmacy -Hold all preadmission sedative medications/psychotropic medications -EEG -Seizure precautions  Active Problems:   Leukocytosis -Likely secondary to hypoperfusion from dehydration noting patient had elevated lactic acid that responded to hydration -Given presentation patient possibly experienced aspiration event prior to admission although currently no respiratory symptoms and  not hypoxic-continue to monitor -Urinalysis and chest x-ray unremarkable this time -Follow labs    Dehydration -IV fluids at 100/hr -Follow electrolytes -Hold thiazide diuretic    Hypertension -Hold preadmission thiazide diuretic    Chronic pain/Anxiety -Old preadmission Xanax, baclofen, Cymbalta, Restoril and Ultram    Hypothyroidism -Continue Synthroid -Check TSH    HLD -Holding statin in setting of altered mental status    GERD (gastroesophageal reflux disease) -Not on PPI or H2 blocker prior to admission      DVT prophylaxis: Lovenox Code Status: Full  Family Communication: No family at bedside  Disposition Plan: Home Consults called: Neurology/[place]     ELLIS,ALLISON L. ANP-BC [facility] Pager [phone]   If 7PM-7AM, please contact night-coverage [email or web address] Password [facility]  02/22/2017, 2:58 PM

## 2017-02-22 NOTE — ED Notes (Signed)
EEG Tech at the Bedside.

## 2017-02-22 NOTE — ED Notes (Signed)
Patient transported to MRI 

## 2017-02-22 NOTE — Procedures (Signed)
Date of recording 02/22/2017  Referring [place]  Reason for the study 66 year old female with altered mental status.  Technical Digital EEG recording using 10-20 international electrode system  Description of the recording Posterior background activity is 3-5 Hz at time it reaches to 6 Hz and is reactive. EEG comprised of generalized delta and theta slowing. Very frequent generalized periodic epileptic discharges (GPEDS). 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

## 2017-02-22 NOTE — ED Triage Notes (Signed)
Pt presents to the ed with ems from home with complaints of altered mental status.  Per the family she had complaints of dizziness last night and woke up this morning altered. The patient is alert to voice, unable to follow commands.  Pt has urine on her from head to toe.  cbg and vs wnl

## 2017-02-23 ENCOUNTER — Encounter (HOSPITAL_COMMUNITY): Payer: Self-pay | Admitting: General Practice

## 2017-02-23 DIAGNOSIS — Z79899 Other long term (current) drug therapy: Secondary | ICD-10-CM

## 2017-02-23 DIAGNOSIS — E039 Hypothyroidism, unspecified: Secondary | ICD-10-CM

## 2017-02-23 DIAGNOSIS — E860 Dehydration: Secondary | ICD-10-CM

## 2017-02-23 DIAGNOSIS — F419 Anxiety disorder, unspecified: Secondary | ICD-10-CM

## 2017-02-23 DIAGNOSIS — E876 Hypokalemia: Secondary | ICD-10-CM | POA: Diagnosis present

## 2017-02-23 DIAGNOSIS — R4182 Altered mental status, unspecified: Secondary | ICD-10-CM

## 2017-02-23 LAB — COMPREHENSIVE METABOLIC PANEL
ALT: 13 U/L — ABNORMAL LOW (ref 14–54)
AST: 25 U/L (ref 15–41)
Albumin: 3.4 g/dL — ABNORMAL LOW (ref 3.5–5.0)
Alkaline Phosphatase: 58 U/L (ref 38–126)
Anion gap: 13 (ref 5–15)
BUN: 12 mg/dL (ref 6–20)
CO2: 26 mmol/L (ref 22–32)
Calcium: 8.6 mg/dL — ABNORMAL LOW (ref 8.9–10.3)
Chloride: 99 mmol/L — ABNORMAL LOW (ref 101–111)
Creatinine, Ser: 0.82 mg/dL (ref 0.44–1.00)
GFR calc Af Amer: >60 mL/min (ref >60)
GFR calc non Af Amer: >60 mL/min (ref >60)
Glucose, Bld: 106 mg/dL — ABNORMAL HIGH (ref 65–99)
Potassium: 2.5 mmol/L — CL (ref 3.5–5.1)
Sodium: 138 mmol/L (ref 135–145)
Total Bilirubin: 0.6 mg/dL (ref 0.3–1.2)
Total Protein: 6 g/dL — ABNORMAL LOW (ref 6.5–8.1)

## 2017-02-23 LAB — CBC
HCT: 34.5 % — ABNORMAL LOW (ref 36.0–46.0)
Hemoglobin: 11 g/dL — ABNORMAL LOW (ref 12.0–15.0)
MCH: 25.1 pg — ABNORMAL LOW (ref 26.0–34.0)
MCHC: 31.9 g/dL (ref 30.0–36.0)
MCV: 78.6 fL (ref 78.0–100.0)
Platelets: 243 10*3/uL (ref 150–400)
RBC: 4.39 MIL/uL (ref 3.87–5.11)
RDW: 21.4 % — ABNORMAL HIGH (ref 11.5–15.5)
WBC: 7.7 10*3/uL (ref 4.0–10.5)

## 2017-02-23 LAB — PHOSPHORUS: Phosphorus: 3 mg/dL (ref 2.5–4.6)

## 2017-02-23 LAB — TSH: TSH: 0.992 u[IU]/mL (ref 0.350–4.500)

## 2017-02-23 LAB — MAGNESIUM: Magnesium: 1.7 mg/dL (ref 1.7–2.4)

## 2017-02-23 LAB — BASIC METABOLIC PANEL
Anion gap: 11 (ref 5–15)
BUN: 10 mg/dL (ref 6–20)
CO2: 26 mmol/L (ref 22–32)
Calcium: 9.8 mg/dL (ref 8.9–10.3)
Chloride: 106 mmol/L (ref 101–111)
Creatinine, Ser: 0.76 mg/dL (ref 0.44–1.00)
GFR calc Af Amer: >60 mL/min (ref >60)
GFR calc non Af Amer: >60 mL/min (ref >60)
Glucose, Bld: 100 mg/dL — ABNORMAL HIGH (ref 65–99)
Potassium: 3.2 mmol/L — ABNORMAL LOW (ref 3.5–5.1)
Sodium: 143 mmol/L (ref 135–145)

## 2017-02-23 LAB — MRSA PCR SCREENING: MRSA by PCR: NEGATIVE

## 2017-02-23 MED ORDER — PHENYLEPHRINE IN HARD FAT 0.25 % RE SUPP: 1.0000 | Freq: Two times a day (BID) | RECTAL | Status: DC

## 2017-02-23 MED ORDER — POTASSIUM CHLORIDE 10 MEQ/100ML IV SOLN
10.0000 meq | INTRAVENOUS | Status: AC
  Administered 2017-02-23 (×5): 10 meq via INTRAVENOUS
  Filled 2017-02-23 (×5): qty 100

## 2017-02-23 MED ORDER — MELATONIN 3 MG PO TABS
3.0000 mg | ORAL_TABLET | Freq: Every evening | ORAL | Status: DC | PRN
  Administered 2017-02-24: 3 mg via ORAL
  Filled 2017-02-23 (×2): qty 1

## 2017-02-23 MED ORDER — POTASSIUM CHLORIDE IN NACL 40-0.9 MEQ/L-% IV SOLN
INTRAVENOUS | Status: DC
  Administered 2017-02-23 (×2): 100 mL/h via INTRAVENOUS
  Filled 2017-02-23 (×4): qty 1000

## 2017-02-23 MED ORDER — MAGNESIUM SULFATE 2 GM/50ML IV SOLN
2.0000 g | Freq: Once | INTRAVENOUS | Status: AC
  Administered 2017-02-23: 2 g via INTRAVENOUS
  Filled 2017-02-23: qty 50

## 2017-02-23 NOTE — Progress Notes (Signed)
S/O: No new events.  BP 119/69   Pulse 67   Temp 99.1 F (37.3 C) (Oral)   Resp (!) 25   Wt 55.3 kg (122 lb)   SpO2 98%   BMI 22.68 kg/m   Exam improved relative to prior documented Neurological examination. Drowsy. Mild dysarthria. Cognitive slowing noted. Now able to answer basic questions regarding symptoms, denying headache or other pain. Does not know why she is in the hospital but states that she does not feel well and that she feels cold. Pupils symmetric at 5 mm and reactive. Will track examiner normally. Moves all 4 extremities spontaneously.   A/R: No change to current assessment/plan. Continue to monitor closely.   Electronically signed: Dr. Kerney Elbe

## 2017-02-23 NOTE — Evaluation (Signed)
Clinical/Bedside Swallow Evaluation Patient Details  Name: ANNESSA SATRE MRN: [id number] Date of Birth: Apr 06, 1951  Today's Date: 02/23/2017 Time: SLP Start Time (ACUTE ONLY): 1145 SLP Stop Time (ACUTE ONLY): 1155 SLP Time Calculation (min) (ACUTE ONLY): 10 min  Past Medical History:  Past Medical History:  Diagnosis Date  . Anxiety    panic attacks  . COPD (chronic obstructive pulmonary disease) ([place])    told has copd, no current inhaler use  . Depression   . GERD (gastroesophageal reflux disease)   . Hypertension   . Hypothyroidism   . Insomnia   . Migraine   . Migraine   . Osteopenia    Past Surgical History:  Past Surgical History:  Procedure Laterality Date  . ABDOMINAL HYSTERECTOMY    . bladder tac    . COLON SURGERY    . COLOSTOMY CLOSURE  04/19/2012   Procedure: COLOSTOMY CLOSURE;  Surgeon: Adin Hector, MD;  Location: [facility];  Service: General;  Laterality: N/A;  Laparotomy, Resection and Closure of Colostomy  . COLOSTOMY TAKEDOWN N/A 04/12/2016   Procedure: [place] TAKEDOWN;  Surgeon: Johnathan Hausen, MD;  Location: [facility];  Service: General;  Laterality: N/A;  . LAPAROTOMY  10/04/2011, colostomy also   Procedure: EXPLORATORY LAPAROTOMY;  Surgeon: Adin Hector, MD;  Location: [facility];  Service: General;  Laterality: N/A;  left partial colectomy with colostomy  . LAPAROTOMY  04/19/2012   Procedure: EXPLORATORY LAPAROTOMY;  Surgeon: Adin Hector, MD;  Location: [facility];  Service: General;  Laterality: N/A;  . LAPAROTOMY N/A 11/29/2015   Procedure: EXPLORATORY LAPAROTOMY; SUBTOTAL COLECTOMY WITH HARTMAN PROCEDURE AND END COLOSTOMY;  Surgeon: Johnathan Hausen, MD;  Location: [facility];  Service: General;  Laterality: N/A;  . TUBAL LIGATION    . VENTRAL HERNIA REPAIR  04/19/2012   Procedure: HERNIA REPAIR VENTRAL ADULT;  Surgeon: Adin Hector, MD;  Location: [facility];  Service: General;  Laterality: N/A;   HPI:  66 y.o.femalewho presents with AMS. Neurology  was consulted and felt the patient was either having symptoms related to polypharmacy versus undetected seizure disorder. Dx include toxic metabolic encephalopathy, leukocytosis, dehydration, GERD.    Assessment / Plan / Recommendation Clinical Impression  Pt presents with normal oropharyngeal swallow with adequate mastication, brisk swallow response, no s/s of aspiration.  Resume a regular diet, thin liquids.  No SLP f/u.  SLP Visit Diagnosis: Dysphagia, unspecified (R13.10)    Aspiration Risk  No limitations    Diet Recommendation   regular, thin liquids  Medication Administration: Whole meds with liquid    Other  Recommendations     Follow up Recommendations        Frequency and Duration            Prognosis        Swallow Study   General Date of Onset: 02/22/17 HPI: 66 y.o.femalewho presents with AMS. Neurology was consulted and felt the patient was either having symptoms related to polypharmacy versus undetected seizure disorder. Dx include toxic metabolic encephalopathy, leukocytosis, dehydration, GERD.  Type of Study: Bedside Swallow Evaluation Previous Swallow Assessment: no Diet Prior to this Study: NPO Temperature Spikes Noted: Yes Respiratory Status: Room air History of Recent Intubation: No Behavior/Cognition: Alert;Confused Oral Cavity Assessment: Within Functional Limits Oral Care Completed by SLP: No Oral Cavity - Dentition: Adequate natural dentition Vision: Functional for self-feeding Self-Feeding Abilities: Able to feed self Patient Positioning: Upright in bed Baseline Vocal Quality: Normal Volitional Cough: Strong Volitional Swallow: Able to elicit  Oral/Motor/Sensory Function Overall Oral Motor/Sensory Function: Within functional limits   Ice Chips Ice chips: Within functional limits   Thin Liquid Thin Liquid: Within functional limits    Nectar Thick Nectar Thick Liquid: Not tested   Honey Thick Honey Thick Liquid: Not tested   Puree  Puree: Within functional limits   Solid   GO   Solid: Within functional limits        Juan Quam Laurice 02/23/2017,11:57 AM

## 2017-02-23 NOTE — Progress Notes (Signed)
Patient to transfer to 5W01 report given to receiving nurse, all questions answered at this time.  Pt. VSS with no s/s of distress noted.  Patient stable for transfer.

## 2017-02-23 NOTE — Progress Notes (Signed)
Subjective: Seen and examined. No acute distress.  Exam: Vitals:   02/23/17 0800 02/23/17 0843  BP: 137/78 (!) 146/81  Pulse: 78 79  Resp: 12 15  Temp:    SpO2: 99% 100%   GENERAL: Awake, alert in NAD HEENT: - Normocephalic and atraumatic, dry mm, no LN++, no Thyromegally LUNGS - Clear to auscultation bilaterally with no wheezes CV - S1S2 RRR, no m/r/g, equal pulses bilaterally. ABDOMEN - Soft, nontender, nondistended with normoactive BS  NEURO:  Mental Status: AA&O To person, place, month and year. Could not tell me the date. Language: speech is clear.  Naming, repetition, fluency, and comprehension intact. Cranial Nerves: PERRL 90mm/brisk. EOMI, visual fields full, no facial asymmetry, facial sensation intact, hearing intact, tongue/uvula/soft palate midline, normal sternocleidomastoid and trapezius muscle strength. No evidence of tongue atrophy or fibrillations Motor:5/5 all over, normal tone and normal bulk. Tone: is normal and bulk is normal Sensation- Intact to light touch bilaterally Coordination: FTN intact bilaterally, no ataxia in BLE. Gait- deferred   Pertinent Labs: Reviewed. Significant for mild anemia and severe hypokalemia this morning.  CBC    Component Value Date/Time   WBC 7.7 02/23/2017 0042   RBC 4.39 02/23/2017 0042   HGB 11.0 (L) 02/23/2017 0042   HGB 9.3 (L) 12/05/2016 1245   HCT 34.5 (L) 02/23/2017 0042   HCT 29.4 (L) 12/05/2016 1245   PLT 243 02/23/2017 0042   PLT 427 (H) 12/05/2016 1245   MCV 78.6 02/23/2017 0042   MCV 70 (L) 12/05/2016 1245   MCH 25.1 (L) 02/23/2017 0042   MCHC 31.9 02/23/2017 0042   RDW 21.4 (H) 02/23/2017 0042   RDW 16.8 (H) 12/05/2016 1245   LYMPHSABS 0.9 02/22/2017 0847   LYMPHSABS 1.2 12/05/2016 1245   MONOABS 0.3 02/22/2017 0847   EOSABS 0.0 02/22/2017 0847   EOSABS 0.1 12/05/2016 1245   BASOSABS 0.0 02/22/2017 0847   BASOSABS 0.0 12/05/2016 1245   CMP     Component Value Date/Time   NA 138 02/23/2017 0042   NA 138 12/05/2016 1245   K 2.5 (LL) 02/23/2017 0042   CL 99 (L) 02/23/2017 0042   CO2 26 02/23/2017 0042   GLUCOSE 106 (H) 02/23/2017 0042   BUN 12 02/23/2017 0042   BUN 22 12/05/2016 1245   CREATININE 0.82 02/23/2017 0042   CREATININE 0.79 11/06/2012 0851   CALCIUM 8.6 (L) 02/23/2017 0042   PROT 6.0 (L) 02/23/2017 0042   PROT 7.2 12/05/2016 1245   ALBUMIN 3.4 (L) 02/23/2017 0042   ALBUMIN 4.4 12/05/2016 1245   AST 25 02/23/2017 0042   ALT 13 (L) 02/23/2017 0042   ALKPHOS 58 02/23/2017 0042   BILITOT 0.6 02/23/2017 0042   BILITOT 0.2 12/05/2016 1245   GFRNONAA >60 02/23/2017 0042   GFRAA >60 02/23/2017 0042     Assessment: 66 year old woman with a past medical history of anxiety, depression, hypertension, hyperlipidemia, migraines, hypothyroidism and also possible ongoing memory loss, was brought in when she was found to be confused and lethargic. She lives by herself and family do not know if she had taken excessive medication. In the emergency room, her vitals were stable. She was very lethargic. She was taken for an MRI and MRA, both of which were normal. Her vitals in the ER was stable. Concern for tramadol overdose/in the overdose  EEG was obtained to rule out seizures. EEG showed GPEDs. She was given 1 mg of Ativan and a gram of Keppra IV. She was also started on Keppra 500  twice a day. It is my suspicion that her EEG findings were probably related to the toxic metabolic encephalopathy and do not affect any underlying ictal activity. GPEDs in conditions other than anoxic brain injury have no clear significance. This morning, her exam is markedly improved compared from last evening.  Impression Toxic metabolic encephalopathy from Likely drug overdose (Benzodiazepine or Tramadol) Evaluate for memory disorders as an outpatient Less likely seizure disorder.  Recommendations: -Discontinue Keppra. Even if this episode that brought her in could be thought of as a seizure, it  might have been a provoked seizure due to lowering of seizure threshold with medications overdose. -Minimize sedating meds -Outpatient Neurology follow up and a possible repeat EEG at that time as well - defer to OP neurology. Also for memory disorder. - can consider restarting anti-epileptic medication if there is a witnessed or concerning episode for seizure.  We will be available as needed. Please call with questions.  Amie Portland, MD [facility] [phone]  If 7pm to 7am, please call on call as listed on AMION.

## 2017-02-23 NOTE — Progress Notes (Addendum)
Received pt.from 3C nurse via wheelchair,Alert ,oriented x3.no resp.distress noted.Made comfortable in bed.V/S taken & recorded.Placed pt.on tele #01.Oriented pt.to the room ,call bell & instructed to call the nurse before getting OOB .Will continue to monitor pt.Skin intact .

## 2017-02-23 NOTE — Progress Notes (Signed)
Patient transferred to 5W01 via wheelchair.  Family present at time of transfer and took patient's belongings with them.

## 2017-02-23 NOTE — ED Notes (Signed)
Neuro at bedside.

## 2017-02-23 NOTE — Evaluation (Signed)
Physical Therapy Evaluation Patient Details Name: Stacy Dalton MRN: [id number] DOB: Nov 26, 1950 Today's Date: 02/23/2017   History of Present Illness  Patient is a 66 y/o female who presents with AMS likely from several different etiologies including polypharmacy, possible seizure. PMH includes HTN, depression, anxiety, COPD.  Clinical Impression  Patient presents with impaired cognition, impaired balance, deconditioning/weakness and impaired mobility s/p above. Tolerated gait training with Min A for balance. Pt independent PTA and cares for grandchildren. Her daughter lives with her 4 days/week. Might benefit from RW for support due to instability. Expect good progress with mobility with increased activity. Recommend cognitive assessment. Will follow acutely to maximize independence and mobility prior to return home. Will need support from daughter initially for safety.      Follow Up Recommendations Home health PT;Supervision for mobility/OOB    Equipment Recommendations  Other (comment) (TBA)    Recommendations for Other Services OT consult     Precautions / Restrictions Precautions Precautions: Fall Restrictions Weight Bearing Restrictions: No      Mobility  Bed Mobility Overal bed mobility: Needs Assistance Bed Mobility: Supine to Sit     Supine to sit: Supervision;HOB elevated     General bed mobility comments: No assist needed, use of rail.   Transfers Overall transfer level: Needs assistance Equipment used: None Transfers: Sit to/from Stand Sit to Stand: Min guard         General transfer comment: Min guard for safety. First time up.  Ambulation/Gait Ambulation/Gait assistance: Min assist Ambulation Distance (Feet): 150 Feet Assistive device: None Gait Pattern/deviations: Step-through pattern;Decreased stride length;Staggering left;Staggering right;Scissoring Gait velocity: decreased Gait velocity interpretation: <1.8 ft/sec, indicative of risk for  recurrent falls General Gait Details: Slow, unsteady gait with staggering noted right/left, no arm swing bilaterally. "I feel good," despite having imbalance. VSS.  Stairs            Wheelchair Mobility    Modified Rankin (Stroke Patients Only)       Balance Overall balance assessment: Needs assistance Sitting-balance support: Feet supported;No upper extremity supported Sitting balance-Leahy Scale: Fair     Standing balance support: During functional activity Standing balance-Leahy Scale: Fair Standing balance comment: Able to stand statically and perform some dynamic standing without UE support but requires Min A at times due to instability.                              Pertinent Vitals/Pain Pain Assessment: Faces Faces Pain Scale: Hurts little more Pain Location: chronic back pain Pain Descriptors / Indicators: Aching;Constant Pain Intervention(s): Monitored during session    Home Living Family/patient expects to be discharged to:: Private residence Living Arrangements: Alone;Children (daughter lives with her 4 days/week.) Available Help at Discharge: Family;Available PRN/intermittently Type of Home: House Home Access: Stairs to enter Entrance Stairs-Rails: None Entrance Stairs-Number of Steps: 1+1 Home Layout: One level Home Equipment: None      Prior Function Level of Independence: Independent         Comments: Cares for grandchildren. Drives.      Hand Dominance   Dominant Hand: Right    Extremity/Trunk Assessment   Upper Extremity Assessment Upper Extremity Assessment: Defer to OT evaluation    Lower Extremity Assessment Lower Extremity Assessment: Generalized weakness       Communication   Communication: No difficulties  Cognition Arousal/Alertness: Awake/alert Behavior During Therapy: Flat affect Overall Cognitive Status: Impaired/Different from baseline Area of Impairment: Memory;Safety/judgement;Awareness;Problem  solving  Memory: Decreased short-term memory   Safety/Judgement: Decreased awareness of safety Awareness: Emergent Problem Solving: Slow processing;Requires verbal cues General Comments: Slow to respond to questions. Some answers do not always make sense. Poor safety awareness. Pt seems to have poor memory; cannot answer if she has had any recent falls. "today is the 18th, i can keep going if you want."      General Comments      Exercises     Assessment/Plan    PT Assessment Patient needs continued PT services  PT Problem List Decreased strength;Decreased mobility;Pain;Decreased balance;Decreased knowledge of use of DME;Decreased cognition;Decreased safety awareness       PT Treatment Interventions Therapeutic activities;Gait training;Therapeutic exercise;Patient/family education;Balance training;Functional mobility training;Stair training;DME instruction;Cognitive remediation    PT Goals (Current goals can be found in the Care Plan section)  Acute Rehab PT Goals Patient Stated Goal: to get up and move around PT Goal Formulation: With patient Time For Goal Achievement: 03/09/17 Potential to Achieve Goals: Good    Frequency Min 3X/week   Barriers to discharge        Co-evaluation               AM-PAC PT "6 Clicks" Daily Activity  Outcome Measure Difficulty turning over in bed (including adjusting bedclothes, sheets and blankets)?: None Difficulty moving from lying on back to sitting on the side of the bed? : None Difficulty sitting down on and standing up from a chair with arms (e.g., wheelchair, bedside commode, etc,.)?: None Help needed moving to and from a bed to chair (including a wheelchair)?: A Little Help needed walking in hospital room?: A Little Help needed climbing 3-5 steps with a railing? : A Lot 6 Click Score: 20    End of Session Equipment Utilized During Treatment: Gait belt Activity Tolerance: Patient tolerated  treatment well Patient left: in bed;with call bell/phone within reach;with bed alarm set (sitting EOB eating lunch) Nurse Communication: Mobility status PT Visit Diagnosis: Unsteadiness on feet (R26.81);Muscle weakness (generalized) (M62.81);Pain Pain - part of body:  (back)    Time: 4580-9983 PT Time Calculation (min) (ACUTE ONLY): 20 min   Charges:   PT Evaluation $PT Eval Moderate Complexity: 1 Mod     PT G CodesWray Kearns, PT, DPT [phone]    Marguarite Arbour A Lamaj Metoyer 02/23/2017, 3:14 PM

## 2017-02-23 NOTE — ED Notes (Signed)
Patient is stable and ready to be transport to the floor at this time.  Report was called to [facility] RN.  Belongings taken with the patient to the floor.

## 2017-02-23 NOTE — Progress Notes (Addendum)
e  Progress Note    Stacy Dalton  LYY:[id number] DOB: [birth year]  DOA: 02/22/2017 PCP: [name], FNP    Brief Narrative:   Chief complaint: Follow-up altered mental status  Medical records reviewed and are as summarized below:  Stacy Dalton is an 66 y.o. female with a PMH of chronic back pain, anxiety, hypertension, dyslipidemia, hypothyroidism and history of migraines who was admitted 02/22/17 for evaluation of altered mental status. Family essentially unable to awaken her and found her incontinent of a large amount of urine. CT in the ED showed a possible subacute CVA but subsequent MRI/MRA negative. Neurology subsequently consulted.  Assessment/Plan:   Principal Problem:   Toxic metabolic encephalopathy Appears to be most consistent with polypharmacy. Discussed case with neurologist who initially recommended treatment with Ativan/Keppra. No need for further antiepileptics. Lengthy discussion held with the patient with regard to her medications. She was resistant to the idea that her medications were contributing to her presenting symptoms. Nonetheless, it was explained to her that these medications interact with one another and have a more potent effect when used in combination. She will need to follow-up at the neuro clinic and memory disorder clinic for a formal neuro psychiatric evaluation. Continues to have alteration in her mental status and has not yet stable for discharge.  Active Problems:   Chronic pain Continue to hold sedating medications.    Dehydration Continue IV fluids. Diuretic therapy on hold.    Anxiety Takes Xanax at home. Would benefit from SSRI therapy instead of benzodiazepines.    GERD (gastroesophageal reflux disease) Symptomatic treatment as needed.    HTN (hypertension) Continue to hold thiazide diuretic for now.    Dyslipidemia Statin currently on hold.    Hypothyroidism, adult Continue Synthroid.    Severe hypokalemia Give  magnesium, 5 runs of IV potassium and add potassium to IV fluids.   Family Communication/Anticipated D/C date and plan/Code Status   DVT prophylaxis: Lovenox ordered. Code Status: Full Code.  Family Communication: No family present at the bedside. Disposition Plan: Possibly home 02/24/17 if sensorium clear and mental status improved.   Medical Consultants:    Neurology   Anti-Infectives:    None  Subjective:   Very defensive about discussing reducing her medications. Says she has chronic back and muscular pain in her legs, and that she has had "4 major surgeries". She says she cares for her daughter's children because her daughter is an alcoholic. [facility] manages her back pain. Dr. Hassell Done and Dr. Dalbert Batman did her sugeries to her bowels, and that she has had muscular back pain in the thoracic area since. Has 4 sons, none local.   Objective:    Vitals:   02/23/17 0700 02/23/17 0716 02/23/17 0730 02/23/17 0800  BP: (!) 148/90  (!) 144/87 137/78  Pulse: 80  89 78  Resp: 12  (!) 24 12  Temp:      TempSrc:      SpO2: 100% 100% 100% 99%  Weight:        Intake/Output Summary (Last 24 hours) at 02/23/17 0851 Last data filed at 02/23/17 0755  Gross per 24 hour  Intake              110 ml  Output              750 ml  Net             -640 ml   Filed Weights   02/22/17 1275  Weight: 55.3 kg (122 lb)    Exam: General: Slow to respond, but otherwise awake and responsive. Elderly-appearing female. Cardiovascular: Heart sounds show a regular rate, and rhythm. No gallops or rubs. No murmurs. No JVD. Lungs: Clear to auscultation bilaterally with good air movement. No rales, rhonchi or wheezes. Abdomen: Soft, nontender, nondistended with normal active bowel sounds. No masses. No hepatosplenomegaly. Neurological: Alert and oriented 3. Moves all extremities 4 with equal strength, generalized weakness. Cranial nerves II through XII grossly intact. Poor short-term memory. Unable  to count backwards from 100 x 7. Unable to spell the word world backwards. Only remembered 1/3 items in short-term recall. Skin: Warm and dry. No rashes or lesions. Extremities: No clubbing or cyanosis. No edema. Pedal pulses 2+. Psychiatric: Mood and affect are depressed/flat. Insight and judgment are poor.  Data Reviewed:   I have personally reviewed following labs and imaging studies:  Labs: Labs show the following: Sodium 138, potassium 2.5, chloride 99, bicarbonate 26, BUN 12, creatinine 0.82, glucose 106, magnesium 1.7. LFTs WNL, protein 6.0, albumin 3.4. WBC 7.7, hemoglobin 11, hematocrit 34.5, platelets 243. Lactic acid 2.65---> 1.10.  TSH 0.992.  Microbiology None.  Procedures and diagnostic studies:  Ct Head Wo Contrast  Result Date: 02/22/2017 CLINICAL DATA:  Dizziness with altered mental status EXAM: CT HEAD WITHOUT CONTRAST TECHNIQUE: Contiguous axial images were obtained from the base of the skull through the vertex without intravenous contrast. COMPARISON:  Brain MRI January 05, 2017 FINDINGS: Brain: There is stable age related volume loss. Left lateral ventricle is slightly larger than right lateral ventricle, a stable finding that probably represents an anatomic variant. There is no intracranial mass, hemorrhage, extra-axial fluid collection, or midline shift. There is slight small vessel disease in the centra semiovale bilaterally. Elsewhere gray-white compartments appear normal. No well-defined acute infarct evident. Vascular: The left middle cerebral artery demonstrates higher attenuation than its counterpart on the right. This finding is concerning for earliest changes of hyperdense vessel on the left. There is no appreciable vascular calcification. Skull: Bony calvarium appears intact. Sinuses/Orbits: There is mucosal thickening in several ethmoid air cells bilaterally. Other visualized paranasal sinuses are clear. Orbits appear symmetric bilaterally. Other: Mastoid air cells  are clear. IMPRESSION: 1. Subtle increased attenuation in the left middle cerebral artery compared to the right, best appreciable on axial slice 10 series 3. Question early hyperdense vessel which could be indicative of the earliest changes of a left middle cerebral artery infarct. Given this circumstance, correlation with MR including diffusion imaging advised. 2. Age related volume loss with slight periventricular small vessel disease. No intracranial mass, hemorrhage, or extra-axial fluid collection. No well-defined infarct currently present. 3.   Mucosal thickening in several ethmoid air cells noted. These results were called by telephone at the time of interpretation on 02/22/2017 at 10:41 am to [facility] [place] [place] , who verbally acknowledged these results. Electronically Signed   By: Lowella Grip III M.D.   On: 02/22/2017 10:42   Mr Jodene Nam Head Wo Contrast  Result Date: 02/22/2017 CLINICAL DATA:  Altered level of consciousness. Dizziness last night. EXAM: MRI HEAD WITHOUT CONTRAST MRA HEAD WITHOUT CONTRAST TECHNIQUE: Multiplanar, multiecho pulse sequences of the brain and surrounding structures were obtained without intravenous contrast. Angiographic images of the head were obtained using MRA technique without contrast. COMPARISON:  Brain MRI 01/05/2017 FINDINGS: MRI HEAD FINDINGS Brain: No acute infarction, hemorrhage, hydrocephalus, extra-axial collection or mass lesion. Symmetric mildly increased FLAIR signal along the lower midline frontal lobes and hypothalamic region  is attributed artifact from sphenoid sinuses given the axial and coronal T2 weighted appearance and lack of mass effect. Mild FLAIR hyperintensity in the cerebral white matter attributed to chronic microvascular ischemia given patient's vascular risk factors, stable and age congruent. Vascular: Arterial findings below. Normal dural venous sinus flow voids. Skull and upper cervical spine: Negative for marrow lesion. Asymmetric prominent  joint fluid on the right atlanto occipital and C1-2 facets. Sinuses/Orbits: Negative MRA HEAD FINDINGS Symmetric carotid arteries and branching. There are bilateral fetal type PCAs with small vertebrobasilar system. Vessels are smooth and widely patent. Negative for aneurysm. Apparent poor signal in bilateral distal M1 segments on reformats is attributed to artifact from direction of flow IMPRESSION: Brain MRI: No acute finding or explanation for symptoms. Stable compared to 01/05/2017. Intracranial MRA: Negative.  Left MCA findings on prior CT were artifactual. Electronically Signed   By: Monte Fantasia M.D.   On: 02/22/2017 13:29   Mr Brain Wo Contrast  Result Date: 02/22/2017 CLINICAL DATA:  Altered level of consciousness. Dizziness last night. EXAM: MRI HEAD WITHOUT CONTRAST MRA HEAD WITHOUT CONTRAST TECHNIQUE: Multiplanar, multiecho pulse sequences of the brain and surrounding structures were obtained without intravenous contrast. Angiographic images of the head were obtained using MRA technique without contrast. COMPARISON:  Brain MRI 01/05/2017 FINDINGS: MRI HEAD FINDINGS Brain: No acute infarction, hemorrhage, hydrocephalus, extra-axial collection or mass lesion. Symmetric mildly increased FLAIR signal along the lower midline frontal lobes and hypothalamic region is attributed artifact from sphenoid sinuses given the axial and coronal T2 weighted appearance and lack of mass effect. Mild FLAIR hyperintensity in the cerebral white matter attributed to chronic microvascular ischemia given patient's vascular risk factors, stable and age congruent. Vascular: Arterial findings below. Normal dural venous sinus flow voids. Skull and upper cervical spine: Negative for marrow lesion. Asymmetric prominent joint fluid on the right atlanto occipital and C1-2 facets. Sinuses/Orbits: Negative MRA HEAD FINDINGS Symmetric carotid arteries and branching. There are bilateral fetal type PCAs with small vertebrobasilar  system. Vessels are smooth and widely patent. Negative for aneurysm. Apparent poor signal in bilateral distal M1 segments on reformats is attributed to artifact from direction of flow IMPRESSION: Brain MRI: No acute finding or explanation for symptoms. Stable compared to 01/05/2017. Intracranial MRA: Negative.  Left MCA findings on prior CT were artifactual. Electronically Signed   By: Monte Fantasia M.D.   On: 02/22/2017 13:29   Dg Chest Port 1 View  Result Date: 02/22/2017 CLINICAL DATA:  Altered mental status. EXAM: PORTABLE CHEST 1 VIEW COMPARISON:  04/27/2015 FINDINGS: Patient is mildly rotated on this image. Fullness in the right hilum likely related to patient positioning. Otherwise, the lungs are clear. Heart size is normal. No acute bone abnormality. IMPRESSION: No acute chest findings. Right hilum is slightly prominent and probably secondary to the projection of the image from patient positioning. Electronically Signed   By: Markus Daft M.D.   On: 02/22/2017 09:14    Medications:   . enoxaparin (LOVENOX) injection  40 mg Subcutaneous Q24H  . naloxone  1 spray Nasal Once  . sodium chloride flush  3 mL Intravenous Q12H   Continuous Infusions: . sodium chloride 100 mL/hr at 02/22/17 1527  . levETIRAcetam Stopped (02/22/17 2352)  . potassium chloride 10 mEq (02/23/17 0612)     LOS: 1 day    Greater than 35 minutes spent with this patient with greater than 50% of time in face-to-face contact. The patient needed extra time for full evaluation of her mental  status and repeated explanations about her medications and the changes we made.  Anslee Micheletti  [facility] Pager [phone]. If unable to reach me by pager, please call my cell phone at [phone].  *Please refer to [email or web address], password [email or web address] to get updated schedule on who will round on this patient, as hospitalists switch teams weekly. If 7PM-7AM, please contact night-coverage at [email or web address], password [email or web address] for any  overnight needs.  02/23/2017, 8:51 AM

## 2017-02-24 DIAGNOSIS — E876 Hypokalemia: Secondary | ICD-10-CM

## 2017-02-24 LAB — BASIC METABOLIC PANEL
Anion gap: 10 (ref 5–15)
BUN: 7 mg/dL (ref 6–20)
CO2: 23 mmol/L (ref 22–32)
Calcium: 9.5 mg/dL (ref 8.9–10.3)
Chloride: 108 mmol/L (ref 101–111)
Creatinine, Ser: 0.77 mg/dL (ref 0.44–1.00)
GFR calc Af Amer: >60 mL/min (ref >60)
GFR calc non Af Amer: >60 mL/min (ref >60)
Glucose, Bld: 111 mg/dL — ABNORMAL HIGH (ref 65–99)
Potassium: 3.5 mmol/L (ref 3.5–5.1)
Sodium: 141 mmol/L (ref 135–145)

## 2017-02-24 MED ORDER — POTASSIUM CHLORIDE CRYS ER 20 MEQ PO TBCR
20.0000 meq | EXTENDED_RELEASE_TABLET | Freq: Once | ORAL | Status: AC
  Administered 2017-02-24: 20 meq via ORAL
  Filled 2017-02-24: qty 1

## 2017-02-24 NOTE — Discharge Summary (Addendum)
Physician Discharge Summary  Stacy Dalton DTO:[id number] DOB: 10-10-1950 DOA: 02/22/2017  PCP: Stacy Balloon, FNP  Admit date: 02/22/2017 Discharge date: 02/24/2017  Admitted From: Home Discharge disposition: Home   Recommendations for Outpatient Follow-Up:   1. Recommend minimizing sedating medications and simplification of medications due to high risk of polypharmacy and potential for misuse. 2. Outpatient Neurology follow up and a possible repeat EEG at that time as well - defer to OP neurology. Also needs evaluation for memory disorder.   Discharge Diagnosis:   Principal Problem:   Toxic metabolic encephalopathy with possible seizure Active Problems:   Chronic pain   Dehydration   Anxiety   GERD (gastroesophageal reflux disease)   HTN (hypertension)   Dyslipidemia   Hypothyroidism, adult   Polypharmacy   Hypokalemia  Discharge Condition: Improved.  Diet recommendation: Low sodium, heart healthy.    History of Present Illness:   Stacy Dalton is an 66 y.o. female with a PMH of chronic back pain, anxiety, hypertension, dyslipidemia, hypothyroidism and history of migraines who was admitted 02/22/17 for evaluation of altered mental status. Family essentially unable to awaken her and found her incontinent of a large amount of urine. CT in the ED showed a possible subacute CVA but subsequent MRI/MRA negative. Neurology subsequently consulted.   Hospital Course by Problem:   Principal Problem:   Toxic metabolic encephalopathy with possible seizure in the setting of polypharmacy Presentation most consistent with polypharmacy. EEG abnormal. Discussed case with neurologist who initially recommended treatment with Ativan/Keppra. No need for further antiepileptics per neurologist who states in his note: "Discontinue Keppra. Even if this episode that brought her in could be thought of as a seizure, it might have been a provoked seizure due to lowering of seizure  threshold with medications overdose". Lengthy discussion held with the patient with regard to her medications. She was resistant to the idea that her medications were contributing to her presenting symptoms. Nonetheless, it was explained to her that these medications interact with one another and have a more potent effect when used in combination. She will need to follow-up at the neuro clinic and memory disorder clinic for a formal neuro psychiatric evaluation. Mentation much clearer at discharge.  Active Problems:   Chronic pain Counseled to not combine medications and to only use the lowest effective dose. Denies she combines medications, but has filled #90 hydrocodone 01/30/17 and # 120 Ultram 02/19/17.     Dehydration Resolved with IV fluids and holding diuretics.    Anxiety Takes Xanax at home. Would benefit from SSRI therapy instead of benzodiazepines given history. Told to discontinue Xanax in light of Restoril use.  Advised that combining benzodiazepines and opiate pain medications is unsafe.    GERD (gastroesophageal reflux disease) Symptomatic treatment as needed.    HTN (hypertension) OK to resume thiazide diuretics.    Dyslipidemia Statin currently on hold. OK to resume.    Hypothyroidism, adult Continue Synthroid.    Severe hypokalemia Given magnesium, 5 runs of IV potassium and added potassium to IV fluids. Potassium WNL at discharge.    Medical Consultants:    Neurology.   Discharge Exam:   Vitals:   02/23/17 2023 02/24/17 0534  BP: (!) 148/86 (!) 148/65  Pulse: 74 66  Resp: 18 18  Temp: 98.5 F (36.9 C) 98.5 F (36.9 C)  SpO2: 100% 99%   Vitals:   02/23/17 1144 02/23/17 1544 02/23/17 2023 02/24/17 0534  BP:  (!) 144/69 (!) 148/86 (!) 148/65  Pulse:  93 74 66  Resp:  16 18 18   Temp: 98.9 F (37.2 C) 98.7 F (37.1 C) 98.5 F (36.9 C) 98.5 F (36.9 C)  TempSrc: Oral Oral Oral Oral  SpO2:  100% 100% 99%  Weight:    55 kg (121 lb 4.8 oz)    Height:        General exam: Appears calm and comfortable.  Respiratory system: Clear to auscultation. Respiratory effort normal. Cardiovascular system: S1 & S2 heard, RRR. No JVD,  rubs, gallops or clicks. No murmurs. Gastrointestinal system: Abdomen is nondistended, soft and nontender. No organomegaly or masses felt. Normal bowel sounds heard. Central nervous system: Alert and oriented. No focal neurological deficits. Extremities: No clubbing,  or cyanosis. No edema. Skin: No rashes, lesions or ulcers. Psychiatry: Judgement and insight appear poor. Mood & affect anxious/depressed.    The results of significant diagnostics from this hospitalization (including imaging, microbiology, ancillary and laboratory) are listed below for reference.     Procedures and Diagnostic Studies:   Ct Head Wo Contrast  Result Date: 02/22/2017 CLINICAL DATA:  Dizziness with altered mental status EXAM: CT HEAD WITHOUT CONTRAST TECHNIQUE: Contiguous axial images were obtained from the base of the skull through the vertex without intravenous contrast. COMPARISON:  Brain MRI January 05, 2017 FINDINGS: Brain: There is stable age related volume loss. Left lateral ventricle is slightly larger than right lateral ventricle, a stable finding that probably represents an anatomic variant. There is no intracranial mass, hemorrhage, extra-axial fluid collection, or midline shift. There is slight small vessel disease in the centra semiovale bilaterally. Elsewhere gray-white compartments appear normal. No well-defined acute infarct evident. Vascular: The left middle cerebral artery demonstrates higher attenuation than its counterpart on the right. This finding is concerning for earliest changes of hyperdense vessel on the left. There is no appreciable vascular calcification. Skull: Bony calvarium appears intact. Sinuses/Orbits: There is mucosal thickening in several ethmoid air cells bilaterally. Other visualized paranasal sinuses  are clear. Orbits appear symmetric bilaterally. Other: Mastoid air cells are clear. IMPRESSION: 1. Subtle increased attenuation in the left middle cerebral artery compared to the right, best appreciable on axial slice 10 series 3. Question early hyperdense vessel which could be indicative of the earliest changes of a left middle cerebral artery infarct. Given this circumstance, correlation with MR including diffusion imaging advised. 2. Age related volume loss with slight periventricular small vessel disease. No intracranial mass, hemorrhage, or extra-axial fluid collection. No well-defined infarct currently present. 3.   Mucosal thickening in several ethmoid air cells noted. These results were called by telephone at the time of interpretation on 02/22/2017 at 10:41 am to [facility] , who verbally acknowledged these results. Electronically Signed   By: Lowella Grip III M.D.   On: 02/22/2017 10:42   Mr Jodene Nam Head Wo Contrast  Result Date: 02/22/2017 CLINICAL DATA:  Altered level of consciousness. Dizziness last night. EXAM: MRI HEAD WITHOUT CONTRAST MRA HEAD WITHOUT CONTRAST TECHNIQUE: Multiplanar, multiecho pulse sequences of the brain and surrounding structures were obtained without intravenous contrast. Angiographic images of the head were obtained using MRA technique without contrast. COMPARISON:  Brain MRI 01/05/2017 FINDINGS: MRI HEAD FINDINGS Brain: No acute infarction, hemorrhage, hydrocephalus, extra-axial collection or mass lesion. Symmetric mildly increased FLAIR signal along the lower midline frontal lobes and hypothalamic region is attributed artifact from sphenoid sinuses given the axial and coronal T2 weighted appearance and lack of mass effect. Mild FLAIR hyperintensity in the cerebral white matter attributed to  chronic microvascular ischemia given patient's vascular risk factors, stable and age congruent. Vascular: Arterial findings below. Normal dural venous sinus flow voids. Skull and  upper cervical spine: Negative for marrow lesion. Asymmetric prominent joint fluid on the right atlanto occipital and C1-2 facets. Sinuses/Orbits: Negative MRA HEAD FINDINGS Symmetric carotid arteries and branching. There are bilateral fetal type PCAs with small vertebrobasilar system. Vessels are smooth and widely patent. Negative for aneurysm. Apparent poor signal in bilateral distal M1 segments on reformats is attributed to artifact from direction of flow IMPRESSION: Brain MRI: No acute finding or explanation for symptoms. Stable compared to 01/05/2017. Intracranial MRA: Negative.  Left MCA findings on prior CT were artifactual. Electronically Signed   By: Monte Fantasia M.D.   On: 02/22/2017 13:29   Mr Brain Wo Contrast  Result Date: 02/22/2017 CLINICAL DATA:  Altered level of consciousness. Dizziness last night. EXAM: MRI HEAD WITHOUT CONTRAST MRA HEAD WITHOUT CONTRAST TECHNIQUE: Multiplanar, multiecho pulse sequences of the brain and surrounding structures were obtained without intravenous contrast. Angiographic images of the head were obtained using MRA technique without contrast. COMPARISON:  Brain MRI 01/05/2017 FINDINGS: MRI HEAD FINDINGS Brain: No acute infarction, hemorrhage, hydrocephalus, extra-axial collection or mass lesion. Symmetric mildly increased FLAIR signal along the lower midline frontal lobes and hypothalamic region is attributed artifact from sphenoid sinuses given the axial and coronal T2 weighted appearance and lack of mass effect. Mild FLAIR hyperintensity in the cerebral white matter attributed to chronic microvascular ischemia given patient's vascular risk factors, stable and age congruent. Vascular: Arterial findings below. Normal dural venous sinus flow voids. Skull and upper cervical spine: Negative for marrow lesion. Asymmetric prominent joint fluid on the right atlanto occipital and C1-2 facets. Sinuses/Orbits: Negative MRA HEAD FINDINGS Symmetric carotid arteries and  branching. There are bilateral fetal type PCAs with small vertebrobasilar system. Vessels are smooth and widely patent. Negative for aneurysm. Apparent poor signal in bilateral distal M1 segments on reformats is attributed to artifact from direction of flow IMPRESSION: Brain MRI: No acute finding or explanation for symptoms. Stable compared to 01/05/2017. Intracranial MRA: Negative.  Left MCA findings on prior CT were artifactual. Electronically Signed   By: Monte Fantasia M.D.   On: 02/22/2017 13:29   Dg Chest Port 1 View  Result Date: 02/22/2017 CLINICAL DATA:  Altered mental status. EXAM: PORTABLE CHEST 1 VIEW COMPARISON:  04/27/2015 FINDINGS: Patient is mildly rotated on this image. Fullness in the right hilum likely related to patient positioning. Otherwise, the lungs are clear. Heart size is normal. No acute bone abnormality. IMPRESSION: No acute chest findings. Right hilum is slightly prominent and probably secondary to the projection of the image from patient positioning. Electronically Signed   By: Markus Daft M.D.   On: 02/22/2017 09:14   EEG 02/22/17  Technical Digital EEG recording using 10-20 international electrode system  Description of the recording Posterior background activity is 3-5 Hz at time it reaches to 6 Hz and is reactive. EEG comprised of generalized delta and theta slowing. Very frequent generalized periodic epileptic discharges (GPEDS). 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 Liver Function Tests:  Recent Labs Lab 02/22/17 0847 02/23/17 0042  AST 32 25  ALT 12* 13*  ALKPHOS 71 58  BILITOT 0.7 0.6  PROT 6.8 6.0*  ALBUMIN 4.0 3.4*    CBC:  Recent Labs Lab 02/22/17 0847 02/23/17 0042  WBC 11.7* 7.7  NEUTROABS 10.5*  --   HGB 13.9 11.0*  HCT 42.6 34.5*  MCV 78.7 78.6  PLT 245 243   CBG:  Recent Labs Lab 02/22/17 0831  GLUCAP 136*   Thyroid function studies  Recent Labs  02/23/17 0012  TSH 0.992   Microbiology Recent Results (from the past 240 hour(s))  Urine Culture     Status: None   Collection Time: 02/19/17  4:02 PM  Result Value Ref Range Status   Urine Culture, Routine Final report  Final   Organism ID, Bacteria Comment  Final    Comment: Culture shows less than 10,000 colony forming units of bacteria per milliliter of urine. This colony count is not generally considered to be clinically significant.   Microscopic Examination     Status: None   Collection Time: 02/19/17  4:02 PM  Result Value Ref Range Status   WBC, UA None seen 0 - 5 /hpf Final   RBC, UA None seen 0 - 2 /hpf Final   Epithelial Cells (non renal) 0-10 0 - 10 /hpf Final   Renal Epithel, UA None seen None seen /hpf Final   Bacteria, UA None seen None seen/Few Final  MRSA PCR Screening     Status: None   Collection Time: 02/23/17  8:40 AM  Result Value Ref Range Status   MRSA by PCR NEGATIVE NEGATIVE Final    Comment:        The GeneXpert MRSA Assay (FDA approved for NASAL specimens only), is one component of a comprehensive MRSA colonization surveillance program. It is not intended to diagnose MRSA infection nor to guide or monitor treatment for MRSA infections.      Discharge Instructions:   Discharge Instructions    Call MD for:  extreme fatigue    Complete by:  As directed    Call MD for:  persistant dizziness or light-headedness     Complete by:  As directed    Diet - low sodium heart healthy    Complete by:  As directed    Discharge instructions    Complete by:  As directed    Do not combine sedating medications or pain medicine. You are experiencing toxic side effects from combining pain medicines such as Vicodin (hydrocodone) with Ultram (tramadol) and muscle relaxants (baclofen) and Xanax (Alprazolam) and sleeping medications such as Restoril. You should NEVER combine these medications as they have additive effects.   Face-to-face encounter (required for Medicare/Medicaid patients)    Complete by:  As directed    I RAMA,CHRISTINA certify that this patient is under my care and that I, or a nurse practitioner or physician's assistant working with me, had a face-to-face encounter that meets the physician face-to-face  encounter requirements with this patient on 02/24/2017. The encounter with the patient was in whole, or in part for the following medical condition(s) which is the primary reason for home health care (List medical condition): Frailty, deconditioning post hospitalization. RN to perform home safety evaluation, patient taking excessive amounts of sedating meds, polypharmacy   The encounter with the patient was in whole, or in part, for the following medical condition, which is the primary reason for home health care:  Deconditioning   I certify that, based on my findings, the following services are medically necessary home health services:   Physical therapy Nursing     Reason for Medically Necessary Home Health Services:   Therapy- Instruction on use of Assistive Device for Ambulation on all Surfaces Therapy- Personnel officer, Public librarian Therapy- Instruction on Safe use of Assistive Devices for ADLs Therapy- Home Adaptation to Facilitate Safety Therapy- Therapeutic Exercises to Increase Strength and Endurance Skilled Nursing- Skilled Assessment/Observation Skilled Nursing- Changes in  Medication/Medication Management     My clinical findings support the need for the above services:  Unsafe ambulation due to balance issues   Further, I certify that my clinical findings support that this patient is homebound due to:  Unable to leave home safely without assistance   Home Health    Complete by:  As directed    To provide the following care/treatments:   PT RN     Increase activity slowly    Complete by:  As directed      Allergies as of 02/24/2017      Reactions   Toradol [ketorolac Tromethamine] Shortness Of Breath   Demerol Swelling   Esgic [butalbital-apap-caffeine] Other (See Comments)   jittery   Iodine Other (See Comments)   bp bottomed out per pt several hours later; unsure if pre medicated in past with cm; done in [place]      Medication List    STOP taking these medications   ALPRAZolam 0.5 MG tablet Commonly known as:  XANAX   baclofen 10 MG tablet Commonly known as:  LIORESAL   HYDROcodone-acetaminophen 7.5-325 MG tablet Commonly known as:  NORCO     TAKE these medications   atorvastatin 40 MG tablet Commonly known as:  LIPITOR TAKE ONE-HALF (1/2) TABLET DAILY (CHANGE IN DIRECTIONS) What changed:  how much to take  how to take this  when to take this  additional instructions   AZOR 10-40 MG tablet Generic drug:  amLODipine-olmesartan TAKE 1 TABLET DAILY What changed:  See the new instructions.   COMBIVENT RESPIMAT 20-100 MCG/ACT Aers respimat Generic drug:  Ipratropium-Albuterol Inhale 1 puff into the lungs every 6 (six) hours as needed for wheezing.   docusate sodium 100 MG capsule Commonly known as:  COLACE Take 1 capsule (100 mg total) by mouth 2 (two) times daily as needed for mild constipation.   DULoxetine 30 MG capsule Commonly known as:  CYMBALTA TAKE 1 CAPSULE DAILY What changed:  See the new instructions.  Another medication with the same name was removed. Continue taking this medication, and follow the directions  you see here.   levothyroxine 50 MCG tablet Commonly known as:  SYNTHROID, LEVOTHROID TAKE 1 TABLET DAILY What changed:  See the new instructions.   ondansetron 4 MG disintegrating tablet Commonly known as:  ZOFRAN ODT Take 1 tablet (4 mg total) by mouth every 8 (eight) hours as needed for nausea or vomiting.   temazepam 30 MG capsule Commonly known as:  RESTORIL  Take 1 capsule (30 mg total) by mouth at bedtime as needed for sleep.   traMADol 50 MG tablet Commonly known as:  ULTRAM Take 1-2 tablets (50-100 mg total) by mouth every 12 (twelve) hours as needed. What changed:  reasons to take this   triamcinolone ointment 0.5 % Commonly known as:  KENALOG Apply 1 application topically 2 (two) times daily.   triamterene-hydrochlorothiazide 37.5-25 MG capsule Commonly known as:  DYAZIDE TAKE 1 CAPSULE DAILY What changed:  See the new instructions.   Vitamin D (Ergocalciferol) 50000 units Caps capsule Commonly known as:  DRISDOL TAKE 1 CAPSULE EVERY 7 DAYS What changed:  See the new instructions.            Discharge Care Instructions        Start     Ordered   02/24/17 0000  Increase activity slowly     02/24/17 1001   02/24/17 0000  Diet - low sodium heart healthy     02/24/17 1001   02/24/17 0000  Discharge instructions    Comments:  Do not combine sedating medications or pain medicine. You are experiencing toxic side effects from combining pain medicines such as Vicodin (hydrocodone) with Ultram (tramadol) and muscle relaxants (baclofen) and Xanax (Alprazolam) and sleeping medications such as Restoril. You should NEVER combine these medications as they have additive effects.   02/24/17 1001   02/24/17 0000  Call MD for:  extreme fatigue     02/24/17 1001   02/24/17 0000  Call MD for:  persistant dizziness or light-headedness     02/24/17 1001   02/24/17 0000  Home Health  (Home health needs / face to face )    Question Answer Comment  To provide the following  care/treatments PT   To provide the following care/treatments RN      02/24/17 1005   02/24/17 0000  Face-to-face encounter (required for Medicare/Medicaid patients)  (Home health needs / face to face )    Comments:  I RAMA,CHRISTINA certify that this patient is under my care and that I, or a nurse practitioner or physician's assistant working with me, had a face-to-face encounter that meets the physician face-to-face encounter requirements with this patient on 02/24/2017. The encounter with the patient was in whole, or in part for the following medical condition(s) which is the primary reason for home health care (List medical condition): Frailty, deconditioning post hospitalization. RN to perform home safety evaluation, patient taking excessive amounts of sedating meds, polypharmacy  Question Answer Comment  The encounter with the patient was in whole, or in part, for the following medical condition, which is the primary reason for home health care Deconditioning   I certify that, based on my findings, the following services are medically necessary home health services Physical therapy   I certify that, based on my findings, the following services are medically necessary home health services Nursing   Reason for Medically [place]- Instruction on use of Assistive Device for Ambulation on all Surfaces   Reason for Medically [place]- Gait Training, Public librarian   Reason for Medically [place]- Instruction on Safe use of Assistive Devices for ADLs   Reason for Medically [place] Adaptation to Facilitate Safety   Reason for Medically Necessary Home Health Services Therapy- Therapeutic Exercises to Increase Strength and Endurance   Reason for Medically [place]- Skilled Assessment/Observation  Reason for Medically  [place] Skilled Nursing- Changes in Medication/Medication Management   My clinical findings support the need for the above services Unsafe ambulation due to balance issues   Further, I certify that my clinical findings support that this patient is homebound due to: Unable to leave home safely without assistance      02/24/17 1005     Follow-up Information    Stacy Balloon, FNP. Schedule an appointment as soon as possible for a visit in 1 week(s).   Specialty:  Family Medicine Why:  Hospital follow up. Contact information: [place] [address] [phone]            Time coordinating discharge: 35 minutes.  Signed:  RAMA,CHRISTINA  Pager [phone] [facility] 02/24/2017, 10:38 AM

## 2017-02-24 NOTE — Evaluation (Signed)
Occupational Therapy Evaluation Patient Details Name: Stacy Dalton MRN: [id number] DOB: [birth year] Today's Date: 02/24/2017    History of Present Illness Patient is a 66 y/o female who presents with AMS likely from several different etiologies including polypharmacy, possible seizure. PMH includes HTN, depression, anxiety, COPD.   Clinical Impression   Pt reports she was independent with ADL PTA. Currently pt overall min guard-supervision for ADL and functional mobility. Pt planning to d/c home with near 24/7 supervision from family. No further acute OT needs identified; signing off at this time. Please re-consult if needs change. Thank you for this referral.    Follow Up Recommendations  No OT follow up    Equipment Recommendations  None recommended by OT    Recommendations for Other Services       Precautions / Restrictions Precautions Precautions: Fall Restrictions Weight Bearing Restrictions: No      Mobility Bed Mobility Overal bed mobility: Modified Independent             General bed mobility comments: no assist, HOB slighlty elevated  Transfers Overall transfer level: Needs assistance Equipment used: None Transfers: Sit to/from Stand Sit to Stand: Supervision         General transfer comment: for safety    Balance Overall balance assessment: No apparent balance deficits (not formally assessed)                                         ADL either performed or assessed with clinical judgement   ADL Overall ADL's : Needs assistance/impaired Eating/Feeding: Set up;Sitting   Grooming: Supervision/safety;Standing;Brushing hair;Wash/dry hands   Upper Body Bathing: Set up;Sitting   Lower Body Bathing: Supervison/ safety;Sit to/from stand   Upper Body Dressing : Set up;Sitting   Lower Body Dressing: Supervision/safety;Sit to/from stand   Toilet Transfer: Min guard;Supervision/safety;Ambulation;Regular Toilet (min guard to  bathroom, supervision for return to bed)   Toileting- Clothing Manipulation and Hygiene: Supervision/safety;Sit to/from stand       Functional mobility during ADLs: Supervision/safety       Vision         Perception     Praxis      Pertinent Vitals/Pain Pain Assessment: Faces Faces Pain Scale: Hurts little more Pain Location: back-chronic Pain Descriptors / Indicators: Aching;Constant Pain Intervention(s): Monitored during session     Hand Dominance Right   Extremity/Trunk Assessment Upper Extremity Assessment Upper Extremity Assessment: Overall WFL for tasks assessed   Lower Extremity Assessment Lower Extremity Assessment: Defer to PT evaluation   Cervical / Trunk Assessment Cervical / Trunk Assessment: Normal   Communication Communication Communication: No difficulties   Cognition Arousal/Alertness: Awake/alert Behavior During Therapy: WFL for tasks assessed/performed Overall Cognitive Status: Within Functional Limits for tasks assessed                                     General Comments       Exercises     Shoulder Instructions      Home Living Family/patient expects to be discharged to:: Private residence Living Arrangements: Alone;Children (daughter lives with her 4 days/week, son planning to da) Available Help at Discharge: Family;Available PRN/intermittently Type of Home: House Home Access: Stairs to enter Entrance Stairs-Number of Steps: 1+1 Entrance Stairs-Rails: None Home Layout: One level     Bathroom Shower/Tub: Walk-in shower  Bathroom Toilet: Standard     Home Equipment: None          Prior Functioning/Environment Level of Independence: Independent        Comments: Cares for grandchildren. Drives.         OT Problem List:        OT Treatment/Interventions:      OT Goals(Current goals can be found in the care plan section) Acute Rehab OT Goals Patient Stated Goal: go home today OT Goal  Formulation: All assessment and education complete, DC therapy  OT Frequency:     Barriers to D/C:            Co-evaluation              AM-PAC PT "6 Clicks" Daily Activity     Outcome Measure Help from another person eating meals?: None Help from another person taking care of personal grooming?: A Little Help from another person toileting, which includes using toliet, bedpan, or urinal?: A Little Help from another person bathing (including washing, rinsing, drying)?: A Little Help from another person to put on and taking off regular upper body clothing?: None Help from another person to put on and taking off regular lower body clothing?: A Little 6 Click Score: 20   End of Session    Activity Tolerance: Patient tolerated treatment well Patient left: in bed;with call bell/phone within reach;with bed alarm set  OT Visit Diagnosis: Muscle weakness (generalized) (M62.81);Pain Pain - part of body:  (back)                Time: 1041-1051 OT Time Calculation (min): 10 min Charges:  OT General Charges $OT Visit: 1 Procedure OT Evaluation $OT Eval Low Complexity: 1 Procedure G-Codes:     Quentina Fronek A. Ulice Brilliant, M.S., OTR/L Pager: [place] 02/24/2017, 10:59 AM

## 2017-02-24 NOTE — Progress Notes (Signed)
[name] to be D/C'd home per MD order.  Discussed with the patient and all questions fully answered.  VSS, Skin clean, dry and intact without evidence of skin break down, no evidence of skin tears noted. IV catheter discontinued intact. Site without signs and symptoms of complications. Dressing and pressure applied.  An After Visit Summary was printed and given to the patient.  D/c education completed with patient/family including follow up instructions, medication list, d/c activities limitations if indicated, with other d/c instructions as indicated by MD - patient able to verbalize understanding, all questions fully answered.   Patient instructed to return to ED, call 911, or call MD for any changes in condition.   Patient escorted via [place], and D/C home via private auto.  Milas Hock 02/24/2017 12:56 PM

## 2017-02-24 NOTE — Care Management Note (Signed)
Case Management Note  Patient Details  Name: Stacy Dalton MRN: [id number] Date of Birth: 1950-12-11  Subjective/Objective: 66 y.o. F admitted with AMS to be discharged with HHPT. Pt has used [facility] in the past and I have alerted Jermaine, the rep for that agency of her choice.                    Action/Plan:CM will sign off for now but will be available should additional discharge needs arise or disposition change.   Expected Discharge Date:  02/24/17               Expected Discharge Plan:  [place]  In-House Referral:  NA  Discharge planning Services  CM Consult  Post Acute Care Choice:  Home Health Choice offered to:  Patient, Sibling  DME Arranged:    DME Agency:     HH Arranged:  PT (RN ordered but really not needed) [place] Agency:  [facility]  Status of Service:  Completed, signed off  If discussed at [place] of Stay Meetings, dates discussed:    Additional Comments:  Delrae Sawyers, RN 02/24/2017, 11:54 AM

## 2017-02-24 NOTE — Discharge Instructions (Signed)
Polypharmacy occurs when a person takes multiple medications that may have additive effects.  The risk of having adverse reactions increases with multiple medications and in older adults. Adverse effects can result in confusion, disorientation, delirium and be life threatening. Your pharmacy refill history suggests that you are taking too many medications which can increase the risk of having adverse reactions.  You need to speak with your prescriber about finding safer alternatives to manage your pain, anxiety and sleeping problems.

## 2017-02-26 NOTE — Consult Note (Addendum)
           [facility] CM Primary Care Navigator  02/26/2017  Stacy Dalton January 25, 1951 [id number]   Went to seepatient at the bedsideto identify possible discharge needs but she was already discharged per staff report.  Patient was discharged home with home health services over the weekend.  Primary care provider's officeis listed as doing transition of care and with discharge instruction to follow-up with PCP in a week.  Primary care provider's office called (Kirstin) to notify of patient's discharge and needfor post hospital follow-up and transition of care. Kirstin, RN was notified of patient's health issues (mainly polypharmacy) needing follow-up.   Made aware to refer patient to [facility] care management ifdeemed appropriateand necessary for further services on her follow-up visit.   For questions, please contact:  Dannielle Huh, BSN, RN- [facility] Primary Care Navigator  Telephone: [phone] [place]

## 2017-02-27 ENCOUNTER — Ambulatory Visit (INDEPENDENT_AMBULATORY_CARE_PROVIDER_SITE_OTHER): Payer: Medicare Other | Admitting: Family

## 2017-02-27 ENCOUNTER — Encounter: Payer: Self-pay | Admitting: Family

## 2017-02-27 ENCOUNTER — Telehealth: Payer: Self-pay | Admitting: *Deleted

## 2017-02-27 VITALS — BP 133/84 | HR 83 | Temp 98.3°F | Ht 61.0 in | Wt 119.0 lb

## 2017-02-27 DIAGNOSIS — F419 Anxiety disorder, unspecified: Secondary | ICD-10-CM

## 2017-02-27 DIAGNOSIS — G8929 Other chronic pain: Secondary | ICD-10-CM

## 2017-02-27 DIAGNOSIS — G92 Toxic encephalopathy: Secondary | ICD-10-CM | POA: Diagnosis not present

## 2017-02-27 DIAGNOSIS — Z79899 Other long term (current) drug therapy: Secondary | ICD-10-CM | POA: Diagnosis not present

## 2017-02-27 DIAGNOSIS — Z87891 Personal history of nicotine dependence: Secondary | ICD-10-CM | POA: Diagnosis not present

## 2017-02-27 DIAGNOSIS — K219 Gastro-esophageal reflux disease without esophagitis: Secondary | ICD-10-CM | POA: Diagnosis not present

## 2017-02-27 DIAGNOSIS — Z09 Encounter for follow-up examination after completed treatment for conditions other than malignant neoplasm: Secondary | ICD-10-CM | POA: Diagnosis not present

## 2017-02-27 DIAGNOSIS — E785 Hyperlipidemia, unspecified: Secondary | ICD-10-CM | POA: Diagnosis not present

## 2017-02-27 DIAGNOSIS — M546 Pain in thoracic spine: Secondary | ICD-10-CM | POA: Diagnosis not present

## 2017-02-27 DIAGNOSIS — J449 Chronic obstructive pulmonary disease, unspecified: Secondary | ICD-10-CM | POA: Diagnosis not present

## 2017-02-27 DIAGNOSIS — M545 Low back pain: Secondary | ICD-10-CM | POA: Diagnosis not present

## 2017-02-27 DIAGNOSIS — F329 Major depressive disorder, single episode, unspecified: Secondary | ICD-10-CM | POA: Diagnosis not present

## 2017-02-27 DIAGNOSIS — G928 Other toxic encephalopathy: Secondary | ICD-10-CM

## 2017-02-27 DIAGNOSIS — I10 Essential (primary) hypertension: Secondary | ICD-10-CM | POA: Diagnosis not present

## 2017-02-27 DIAGNOSIS — E039 Hypothyroidism, unspecified: Secondary | ICD-10-CM | POA: Diagnosis not present

## 2017-02-27 MED ORDER — DULOXETINE HCL 60 MG PO CPEP: 60.0000 mg | ORAL_CAPSULE | Freq: Every day | ORAL | 1 refills | Status: DC

## 2017-02-27 NOTE — Patient Instructions (Signed)
Toxic Metabolic Encephalopathy °Toxic metabolic encephalopathy (TME) is a type of brain disorder caused by a change in brain chemistry. This condition may result from illnesses or conditions that cause an imbalance of fluid, minerals (electrolytes), and other substances in the body that affect the way the brain functions. It is not caused by brain damage or brain disease. °TME can cause confusion and other mental disturbances, which are generally referred to as delirium. Untreated delirium may lead to permanent mental changes or worsening medical conditions. Untreated delirium is a life-threatening condition that may need to be treated in the hospital. °What are the causes? °Possible causes of TME that can lead to delirium include: °· Short-term (acute) or long-term (chronic) disease of the kidney or liver. °· Not having enough fluid in the body (dehydration). °· Changes in the acid level (pH) of the blood. °· High or low levels of any of the following substances in the blood: °? Calcium. °? Salt (sodium). °? Sugar (glucose). °? Magnesium. °? Phosphate. °· High body temperature. °· Not having enough oxygen in the blood. °· Low levels of B vitamins. This can result from alcohol abuse. °· Certain medicines, such as steroids and medicines that reduce the activity of the immune system (immunosuppressants). °· Certain infections. ° °What increases the risk? °You may have a higher risk for TME if you: °· Are elderly. °· Have dementia. °· Are in the hospital, especially in intensive care. °· Live in a nursing home. °· Had recent surgery. °· Have liver or kidney disease. °· Have poorly controlled diabetes. °· Have chronic medical problems, especially heart or lung disease. °· Are not getting enough fluids. °· Have poor nutrition. °· Abuse alcohol. ° °What are the signs or symptoms? °Symptoms of TME may include: °· Muscle stiffness or jerking (spasticity). °· Shaking (tremors). °· Flapping of the  hands. °· Weakness. °· Clumsiness. °· Slowed breathing. °· Jerky movements that you cannot control (seizures). °· Not being able to stay awake (drowsiness). °· Not being able to pay attention. °· Loss of consciousness (coma). ° °Symptoms of delirium caused by TME include: °· Confusion. °· Difficulty focusing or concentrating, or inability to focus or concentrate. °· Not knowing where you are (disorientation). °· Seeing or hearing things that are not real (hallucinations). °· Fearfulness. °· False beliefs (delusions). °· Changes in mood or personality. °· Changes in speech, such as saying things that do not make sense. °· Memory loss. °· Irritability. °· Avoiding other people (withdrawal). °· Depression. °· Poor judgment. °· Changes in eating and sleeping patterns. °· Hyperactivity. °· Decreased alertness. °· General mistrust of others (paranoia). ° °Delirium may come and go. Symptoms of delirium may start suddenly or gradually, and they often get worse at night. °How is this diagnosed? °This condition is diagnosed based on: °· Your symptoms and behavior. °· An exam to check how you are thinking, feeling, and behaving (mental status exam). To diagnose delirium, the mental status exam must rule out other possible causes of TME, and must show: °? Changes in attention and awareness. °? Changes that develop over a short period of time and tend to come and go (fluctuate). °? Changes in memory, language, and thinking that were not present before. °· A physical exam. °· Imaging tests, such as: °? MRI. °? CT scan. °· Blood tests to: °? Measure liver and kidney function. °? Check for a lack (deficiency) of vitamin B. °? Check for changes in acid levels (pH) and changes in calcium, sodium, or magnesium levels   in the blood. °? Measure your blood sugar (glucose). °? Measure your blood oxygen level. ° °How is this treated? °Treatment for TME depends on the cause, and it may include. °· Getting fluids through an IV  tube. °· Regulating calcium, sodium, glucose, or magnesium levels in the body. °· Getting oxygen. °· Improving nutrition. °· Treating liver or kidney disease. °· Adjusting certain medicines. °· Treating infections. ° °If the cause is found and treated, delirium usually improves. Managing delirium may include: °· Keeping the room well-lit and quiet. °· Using calendars, pictures, and clocks to prevent disorientation. °· Having frequent checks from nursing staff and visits from caregivers. °· Wearing eyeglasses or a hearing aid, if needed. °· Physical therapy. °· Medicine to treat agitation, anxiety, hallucinations, or delusions. ° °Follow these instructions at home: °· Drink enough fluid to keep your urine clear or pale yellow. °· Take over-the-counter and prescription medicines only as told by your health care provider. °· Return to your normal activities as told by your health care provider. Ask your health care provider what activities are safe for you. °· Follow a healthy diet. Do not skip meals. °· Do not drink alcohol. °· Go to bed at the same time every night. °· Keep all follow-up visits as told by your health care provider. This is important. °Contact a health care provider if: °· You are unable to feed yourself or hydrate yourself. °· You need help at home. °· You start to feel clumsy. °· You start to have tremors or weakness. °Get help right away if: °· You have a seizure. °· You lose consciousness. °· You have trouble breathing. °· You do not feel able to care for yourself at home. °· You have a fever. °· You become disoriented at home. °This information is not intended to replace advice given to you by your health care provider. Make sure you discuss any questions you have with your health care provider. °Document Released: 11/26/2015 Document Revised: 02/21/2016 Document Reviewed: 11/26/2015 °Elsevier Interactive Patient Education © 2018 Elsevier Inc. ° °

## 2017-02-27 NOTE — Telephone Encounter (Signed)
Received call from [facility] regarding patient's recent admission for altered mental status. Concern about polypharmacy and sedating effect of combined medications. Possible misuse.  They recommend that she decrease and/or discontinue use of sedating medications. Review hospital discharge summary for more details.   [facility] also recommended a referral for Care Management services through them. She can be seen in her home by a community nurse and pharmacist.   Neurology referral needed as well per discharge instructions.   She has a hospital f/u scheduled for today.

## 2017-02-27 NOTE — Progress Notes (Signed)
   Subjective:    Patient ID: Stacy Dalton, female    DOB: 01/28/1951, 66 y.o.   MRN: [id number]  HPI PT presents to the office today for hospital follow up for toxic metabolic encephalopathy. PT went to the ED on 02/22/17 and was discharged 02/24/17. Pt was told she had a possible seizure.   Pt was taking her xanax 0.5 mg, restoril 30 mg, Ultram, and Baclofen together. Pt is not only taking Ultram and restoril.   Pt has referral to Neurologists to be evaluated  for possible seizure. We will also order [place] [facility] for a community nurse and pharmacists today.  Pt states she is doing better, but is very nervous about taking her medications.  Pt states her cymbalta was decreased to 30 mg from 90 mg. Pt states with her xanax being stopped she was hoping her Cymbalta would be increased today.    Review of Systems  Musculoskeletal: Positive for back pain.  Psychiatric/Behavioral: The patient is nervous/anxious.   All other systems reviewed and are negative.      Objective:   Physical Exam  Constitutional: She is oriented to person, place, and time. She appears well-developed and well-nourished. No distress.  HENT:  Head: Normocephalic and atraumatic.  Right Ear: External ear normal.  Left Ear: External ear normal.  Nose: Nose normal.  Mouth/Throat: Oropharynx is clear and moist.  Eyes: Pupils are equal, round, and reactive to light.  Neck: Normal range of motion. Neck supple. No thyromegaly present.  Cardiovascular: Normal rate, regular rhythm, normal heart sounds and intact distal pulses.   No murmur heard. Pulmonary/Chest: Effort normal and breath sounds normal. No respiratory distress. She has no wheezes.  Abdominal: Soft. Bowel sounds are normal. She exhibits no distension. There is no tenderness.  Musculoskeletal: Normal range of motion. She exhibits tenderness. She exhibits no edema.  thoracic back pain with flexion  Neurological: She is alert and oriented to person,  place, and time.  Skin: Skin is warm and dry.  Psychiatric: She has a normal mood and affect. Her behavior is normal. Judgment and thought content normal.  Vitals reviewed.     BP 133/84   Pulse 83   Temp 98.3 F (36.8 C) (Oral)   Ht '5\' 1"'$  (1.549 m)   Wt 119 lb (54 kg)   BMI 22.48 kg/m      Assessment & Plan:  1. Toxic metabolic encephalopathy - AMB Referral to [place] Management - CMP14+EGFR  2. Polypharmacy - AMB Referral to [place] [facility] - CMP14+EGFR  3. Chronic bilateral thoracic back pain - AMB Referral to [place] Management - CMP14+EGFR  4. Anxiety - DULoxetine (CYMBALTA) 60 MG capsule; Take 1 capsule (60 mg total) by mouth daily.  Dispense: 90 capsule; Refill: 1 - AMB Referral to [place] Management - CMP14+EGFR  5. Hospital discharge follow-up - AMB Referral to [place] [facility] - CMP14+EGFR  Pt to continue to hold Xanax, baclofen. Pt to take Ultram as needed only for pain and Restoril as needed for sleep, but never together.  We will increase Cymbalta to 60 mg, but can not go any higher. RTO prn and keep chronic follow up appt.   Evelina Dun, FNP

## 2017-02-28 ENCOUNTER — Other Ambulatory Visit: Payer: Self-pay | Admitting: *Deleted

## 2017-02-28 LAB — CMP14+EGFR
ALT: 8 IU/L (ref 0–32)
AST: 18 IU/L (ref 0–40)
Albumin/Globulin Ratio: 1.7 (ref 1.2–2.2)
Albumin: 4.6 g/dL (ref 3.6–4.8)
Alkaline Phosphatase: 80 IU/L (ref 39–117)
BUN/Creatinine Ratio: 25 (ref 12–28)
BUN: 23 mg/dL (ref 8–27)
Bilirubin Total: <0.2 mg/dL (ref 0.0–1.2)
CO2: 20 mmol/L (ref 20–29)
Calcium: 9.7 mg/dL (ref 8.7–10.3)
Chloride: 93 mmol/L — ABNORMAL LOW (ref 96–106)
Creatinine, Ser: 0.91 mg/dL (ref 0.57–1.00)
GFR calc Af Amer: 75 mL/min/{1.73_m2} (ref >59)
GFR calc non Af Amer: 65 mL/min/{1.73_m2} (ref >59)
Globulin, Total: 2.7 g/dL (ref 1.5–4.5)
Glucose: 82 mg/dL (ref 65–99)
Potassium: 3.9 mmol/L (ref 3.5–5.2)
Sodium: 138 mmol/L (ref 134–144)
Total Protein: 7.3 g/dL (ref 6.0–8.5)

## 2017-02-28 NOTE — Patient Outreach (Signed)
[facility]) Care Management  02/28/2017  Stacy Dalton September 12, 1950 [id number]  Referral from MD office; requesting [place] Nurse consult & [place] to evaluate medications:  Per history patient had recent hospital admission with altered mental status. Dx: Toxic metabolic encephalopathy with possible seizure in the setting of polypharmacy. Other dxs: Chronic pain, dehydration, anxiety, hypokalemia.  Telephone call to patient who was advised of reason for call & of [facility] care management services.  HIPPA verification received from patient.  Patient is alert & oriented  & answering questions appropriately.  Patient states she was taking Baclofen, Xanax, & Tramadol together because of ongoing back pain. States she is currently off of Baclofen & Xanax and only takes Tramadol for back pain. Voices that she currently manages own medications & has no difficulty getting prescriptions filled.   States she lives alone but family lives near by & can assist her if needed. States she has home health services ([place] ) in place. States physical therapy saw her yesterday & instructed her on home exercises for her back. States she is independent in getting around & will be able to do the exercises independently. States  physical therapy did not recommend further therapy & will not be coming out any more. States home RN came out once & will probably be coming out 1-2 times every 2 weeks.  Patient voices that she is driving & has already been to MD appointment (8/28) without assistance.    Patient voices that major health concern now is chronic back pain.    Patient consents to [facility] care management services of [facility] & Pharmacists for complex case management.    Plan: Send to care management assistant to assign [facility] care coordinator & pharmacy for complex case management.   Sherrin Daisy, RN BSN [place] Management Coordinator [facility] Care Management   [phone]

## 2017-03-01 ENCOUNTER — Other Ambulatory Visit: Payer: Self-pay

## 2017-03-01 NOTE — Patient Outreach (Signed)
[facility]) Care Management  03/01/2017  SAIDI SANTACROCE Aug 20, 1950 [id number]  Subjective: client reports she has been to follow up appointment with primary care.  Objective: none  Referral received on 03/01/17 from telephonic CM for community care management:  Assessment: 66 yr old with recent admission 8/23-8/25 due to toxic metabolic encephalopathy. History of HTN, chronic back pain.  Transition of care call completed. Client without questions or problem. She has seen her primary care and she reports duloxetine changed back to 60 mg daily per primary care. Home health has started care. She reports physical therapy has provided some home exercises and closed out of her case.  No issues or concerns noted at this time.  RNCM provided contact number and encouraged client to call as needed. 24 hr nurse advice line contact information provided.  Plan: home visit next week.  Thea Silversmith, RN, MSN, [place] [address] Coordinator Cell: [phone]

## 2017-03-02 ENCOUNTER — Telehealth: Payer: Self-pay | Admitting: Family

## 2017-03-02 ENCOUNTER — Other Ambulatory Visit: Payer: Self-pay | Admitting: Pharmacist

## 2017-03-02 DIAGNOSIS — G8929 Other chronic pain: Secondary | ICD-10-CM

## 2017-03-02 DIAGNOSIS — M546 Pain in thoracic spine: Principal | ICD-10-CM

## 2017-03-02 MED ORDER — ONDANSETRON 4 MG PO TBDP: 4.0000 mg | ORAL_TABLET | Freq: Three times a day (TID) | ORAL | 2 refills | Status: DC | PRN

## 2017-03-02 NOTE — Telephone Encounter (Signed)
Please review and advise.

## 2017-03-02 NOTE — Telephone Encounter (Signed)
What is the name of the medication? zofran 4mg . Goes under the tongue. NOT the tab  Have you contacted your pharmacy to request a refill? no  Which pharmacy would you like this sent to? [facility]   Patient notified that their request is being sent to the clinical staff for review and that they should receive a call once it is complete. If they do not receive a call within 24 hours they can check with their pharmacy or our office.

## 2017-03-02 NOTE — Addendum Note (Signed)
Addended by: Thana Ates on: 03/02/2017 03:17 PM   Modules accepted: Orders

## 2017-03-02 NOTE — Patient Outreach (Signed)
[facility]) Care Management  03/02/2017  Stacy Dalton 1950-10-17 [id number]   Patient was called regarding medication management per referral from patient's PCP. Unfortunately, patient did not answer the phone. HIPAA compliant message was left on her voicemail.   Plan:  Call patient back in 3-5 business days.   Elayne Guerin, PharmD, [place] Clinical Pharmacist [phone]

## 2017-03-02 NOTE — Telephone Encounter (Signed)
Prescription sent to pharmacy.

## 2017-03-02 NOTE — Telephone Encounter (Signed)
Detailed message left for patient that rx sent to pharmacy. 

## 2017-03-06 DIAGNOSIS — I10 Essential (primary) hypertension: Secondary | ICD-10-CM | POA: Diagnosis not present

## 2017-03-06 DIAGNOSIS — J449 Chronic obstructive pulmonary disease, unspecified: Secondary | ICD-10-CM | POA: Diagnosis not present

## 2017-03-06 DIAGNOSIS — G92 Toxic encephalopathy: Secondary | ICD-10-CM | POA: Diagnosis not present

## 2017-03-06 DIAGNOSIS — F329 Major depressive disorder, single episode, unspecified: Secondary | ICD-10-CM | POA: Diagnosis not present

## 2017-03-06 DIAGNOSIS — M545 Low back pain: Secondary | ICD-10-CM | POA: Diagnosis not present

## 2017-03-06 DIAGNOSIS — F419 Anxiety disorder, unspecified: Secondary | ICD-10-CM | POA: Diagnosis not present

## 2017-03-08 ENCOUNTER — Ambulatory Visit: Payer: Medicare Other

## 2017-03-08 ENCOUNTER — Other Ambulatory Visit: Payer: Self-pay

## 2017-03-08 ENCOUNTER — Other Ambulatory Visit: Payer: Self-pay | Admitting: Family

## 2017-03-09 NOTE — Patient Outreach (Signed)
[facility]) Care Management  [name]  03/08/2017   Stacy Dalton 1950-07-27 [id number]  Subjective: client reports she is better.  Objective: alert, oriented to self/place/time, skin color within normal limits, respirations even unlabored, gait steady.  Encounter Medications:  Outpatient Encounter Prescriptions as of 03/08/2017  Medication Sig Note  . atorvastatin (LIPITOR) 40 MG tablet TAKE ONE-HALF (1/2) TABLET DAILY (CHANGE IN DIRECTIONS) (Patient taking differently: Take 20 mg by mouth daily. (CHANGE IN DIRECTIONS)) 03/08/2017: States takes one whole tablet 40mg  daily.   . AZOR 10-40 MG tablet TAKE 1 TABLET DAILY (Patient taking differently: TAKE 1 TABLET BY MOUTH DAILY) 02/22/2017: #90 filled 12/11/16 [facility]  . docusate sodium (COLACE) 100 MG capsule Take 1 capsule (100 mg total) by mouth 2 (two) times daily as needed for mild constipation. 02/22/2017: OTC - unable to determine if pt is still taking  . DULoxetine (CYMBALTA) 60 MG capsule Take 1 capsule (60 mg total) by mouth daily.   . Ipratropium-Albuterol (COMBIVENT RESPIMAT) 20-100 MCG/ACT AERS respimat Inhale 1 puff into the lungs every 6 (six) hours as needed for wheezing.   [name] levothyroxine (SYNTHROID, LEVOTHROID) 50 MCG tablet TAKE 1 TABLET DAILY (Patient taking differently: TAKE 1 TABLET (50 MCG) BY MOUTH DAILY) 02/22/2017: # 90 filled 12/04/16 [facility]  . ondansetron (ZOFRAN ODT) 4 MG disintegrating tablet Take 1 tablet (4 mg total) by mouth every 8 (eight) hours as needed for nausea or vomiting.   . temazepam (RESTORIL) 30 MG capsule Take 1 capsule (30 mg total) by mouth at bedtime as needed for sleep. 02/22/2017: #30 filled 01/31/17 [place] (also filled 12/28/16, 11/21/16 and 10/31/16)  . traMADol (ULTRAM) 50 MG tablet Take 1-2 tablets (50-100 mg total) by mouth every 12 (twelve) hours as needed. (Patient taking differently: Take 50-100 mg by mouth every 12 (twelve) hours as needed (pain).  ) 02/22/2017: #120/30 days filled 02/19/17, 01/10/17, 12/08/16 [place] - pt has been filling monthly for a long time  . triamcinolone ointment (KENALOG) 0.5 % Apply 1 application topically 2 (two) times daily. 03/01/2017: Use as needed.  . triamterene-hydrochlorothiazide (DYAZIDE) 37.5-25 MG capsule TAKE 1 CAPSULE DAILY (Patient taking differently: TAKE 1 CAPSULE BY MOUTH DAILY) 02/22/2017: #90 filled 01/26/17 [facility]  . Vitamin D, Ergocalciferol, (DRISDOL) 50000 units CAPS capsule TAKE 1 CAPSULE EVERY 7 DAYS (Patient taking differently: TAKE 1 CAPSULE (50000 UNITS) BY MOUTH EVERY 7 DAYS) 02/22/2017: #12/84 days filled 01/01/17 [facility]   No facility-administered encounter medications on file as of 03/08/2017.     Functional Status:  In your present state of health, do you have any difficulty performing the following activities: 03/08/2017 02/28/2017  Hearing? N N  Vision? N N  Difficulty concentrating or making decisions? N N  Comment - -  Walking or climbing stairs? N N  Dressing or bathing? N N  Doing errands, shopping? N N  Preparing Food and eating ? N -  Using the Toilet? N -  In the past six months, have you accidently leaked urine? Y -  Comment only when i hold my urine for a long time. -  Do you have problems with loss of bowel control? Y -  Comment "sometimes" -  Managing your Medications? N -  Managing your Finances? N -  Housekeeping or managing your Housekeeping? N -  Some recent data might be hidden    Fall/Depression Screening: Fall Risk  03/08/2017 02/28/2017 02/28/2017  Falls in the past year? Yes - Yes  Number falls in past yr: 1 - 1  Injury with Fall? No - No  Comment - - -  Risk for fall due to : - History of fall(s) Medication side effect  Follow up Falls prevention discussed;Education provided Education provided;Falls prevention discussed Education provided   [facility] 2/9 Scores 03/08/2017 02/28/2017 02/27/2017 02/19/2017 02/08/2017 01/29/2017 01/04/2017  PHQ - 2  Score 1 0 1 1 1 1 2   PHQ- 9 Score - - - 3 - - 10    Assessment: 66 year old with history of HTN, chronic back pain. Recent admission 8/23-8/25 with toxic metabolic encephalopathy. Client reports her mental status and orientation has improved since hospitalization. She reports she believes the medications she was taking contributed to her condition. Mrs. Wedel states advanced home health nursing is still involved. She states physical therapist has discharged her providing exercise regimen for her. RNCM encouraged client to continue home exercises as instructed.   Medications reviewed. Client is without questions about her medications at this time. [facility] pharmacist-making outreaches to client.   RNCM encouraged client to call RNCM as needed RNCM encouraged client to call 24 hour nurse advice line as needed.  Plan: transition of care call next week.  [facility] CM Care Plan Problem One     Most Recent Value  Care Plan Problem One  at risk for readmission  Role Documenting the Problem One  Care Management [place] for Problem One  Active  [facility] Long Term Goal   client will not be readmitted within the next 31 days.  [facility] Long Term Goal Start Date  03/01/17  Interventions for Problem One Long Term Goal  home visit completed  [facility] CM Short Term Goal #1   client will call available resources as needed within the next 30 days, ie RNCM, home health provider  [facility] CM Short Term Goal #1 Start Date  03/01/17  Interventions for Short Term Goal #1  reinforced RNCM contact number, encouraged client to call 24 hour nurse advice line as needed. reinforced to continue to follow up with home health and participate with home health as scheduled  [facility] CM Short Term Goal #2   client will walk at least once a week within the next 30 days.  [facility] CM Short Term Goal #2 Start Date  03/08/17  Interventions for Short Term Goal #2  reinforced the importance of maintaining muscle tone in fall prevention, encouraged  continued participation with home health as scheduled.       Stacy Silversmith, RN, MSN, [place] Coordinator Cell: [phone]

## 2017-03-14 ENCOUNTER — Other Ambulatory Visit: Payer: Self-pay

## 2017-03-14 NOTE — Patient Outreach (Signed)
[facility]) Care Management  03/14/2017  Stacy Dalton [birth year] [id number]  Subjective: none  Objective: none  Assessment: 66 year old with history of HTN, chronic back pain. Recent admission 8/23-8/25 with toxic metabolic encephalopathy.  RNCM called for transition of care. No answer. HIPPA compliant message left.  Plan: await return call. Follow up next week if no return call.  Thea Silversmith, RN, MSN, [place] Coordinator Cell: [phone]

## 2017-03-19 ENCOUNTER — Other Ambulatory Visit: Payer: Self-pay

## 2017-03-19 NOTE — Patient Outreach (Signed)
[facility]) Care Management  03/19/2017  Stacy Dalton 15-Oct-1950 [id number]  Subjective: client reports she is doing well.  Objective: none  Assessment: 66 year old with history of HTN, chronic back pain. Recent admission 8/23-8/25 with toxic metabolic encephalopathy.  RNCM called for transition of care. She reports she is doing well. She states she has had her grandchildren during the week and denies any questions or concerns.   Client has seen primary care post hospitalization(August 28) and states she called and scheduled her next appointment with Evelina Dun, NP (December 4).   Discussed medications-client denies any questions or concerns.  Plan: transition of care call next week.  Thea Silversmith, RN, MSN, [place] Coordinator Cell: [phone]

## 2017-03-21 ENCOUNTER — Telehealth: Payer: Self-pay | Admitting: Family

## 2017-03-21 NOTE — Telephone Encounter (Signed)
Left patient message she is due for mammo.

## 2017-03-22 ENCOUNTER — Encounter: Payer: Self-pay | Admitting: Pharmacist

## 2017-03-22 ENCOUNTER — Other Ambulatory Visit: Payer: Self-pay | Admitting: Pharmacist

## 2017-03-22 DIAGNOSIS — M545 Low back pain: Secondary | ICD-10-CM | POA: Diagnosis not present

## 2017-03-22 DIAGNOSIS — F329 Major depressive disorder, single episode, unspecified: Secondary | ICD-10-CM | POA: Diagnosis not present

## 2017-03-22 DIAGNOSIS — G92 Toxic encephalopathy: Secondary | ICD-10-CM | POA: Diagnosis not present

## 2017-03-22 DIAGNOSIS — J449 Chronic obstructive pulmonary disease, unspecified: Secondary | ICD-10-CM | POA: Diagnosis not present

## 2017-03-22 DIAGNOSIS — I10 Essential (primary) hypertension: Secondary | ICD-10-CM | POA: Diagnosis not present

## 2017-03-22 DIAGNOSIS — F419 Anxiety disorder, unspecified: Secondary | ICD-10-CM | POA: Diagnosis not present

## 2017-03-22 NOTE — Patient Outreach (Signed)
[facility]) Care Management  [place]   03/22/2017  Stacy Dalton May 24, 1951 [id number]  Subjective: Patient was called back regarding medication management.  HIPAA identifiers were obtained. Patient is a 66 y.o.femalewith a PMH of chronic back pain, anxiety, hypertension, dyslipidemia, hypothyroidism and history of migraines who was admitted 02/22/17 for evaluation of altered mental status secondary to polypharmacy. (Patient was taking multiple benzodiazepines in combination with tramadol, high dose duloxetine and tramadol. Thankfully, her medication list was adjusted at discharge).  Patient reported managing her own medications without difficulty.  She does not use a pill box  Objective:   Encounter Medications: Outpatient Encounter Prescriptions as of 03/22/2017  Medication Sig Note  . Aspirin-Salicylamide-Caffeine (BC HEADACHE POWDER PO) Take by mouth. Use 1-2 packs daily PRN headache   . atorvastatin (LIPITOR) 40 MG tablet TAKE ONE-HALF (1/2) TABLET DAILY (CHANGE IN DIRECTIONS) (Patient taking differently: Take 20 mg by mouth daily. (CHANGE IN DIRECTIONS)) 03/08/2017: States takes one whole tablet 40mg  daily.   . AZOR 10-40 MG tablet TAKE 1 TABLET DAILY (Patient taking differently: TAKE 1 TABLET BY MOUTH DAILY)   . docusate sodium (COLACE) 100 MG capsule Take 1 capsule (100 mg total) by mouth 2 (two) times daily as needed for mild constipation. 03/22/2017: ONLY TAKING PRN  . DULoxetine (CYMBALTA) 60 MG capsule Take 1 capsule (60 mg total) by mouth daily.   . ferrous sulfate 325 (65 FE) MG tablet Take 325 mg by mouth daily with breakfast.   . Ipratropium-Albuterol (COMBIVENT RESPIMAT) 20-100 MCG/ACT AERS respimat Inhale 1 puff into the lungs every 6 (six) hours as needed for wheezing. 03/22/2017: PRN  . levothyroxine (SYNTHROID, LEVOTHROID) 50 MCG tablet TAKE 1 TABLET DAILY (Patient taking differently: TAKE 1 TABLET (50 MCG) BY MOUTH DAILY)   . ondansetron  (ZOFRAN ODT) 4 MG disintegrating tablet Take 1 tablet (4 mg total) by mouth every 8 (eight) hours as needed for nausea or vomiting.   . temazepam (RESTORIL) 30 MG capsule Take 1 capsule (30 mg total) by mouth at bedtime as needed for sleep.   . traMADol (ULTRAM) 50 MG tablet Take 1-2 tablets (50-100 mg total) by mouth every 12 (twelve) hours as needed. (Patient taking differently: Take 50-100 mg by mouth every 12 (twelve) hours as needed (pain). ) 02/22/2017: #120/30 days filled 02/19/17, 01/10/17, 12/08/16 [place] - pt has been filling monthly for a long time  . triamterene-hydrochlorothiazide (DYAZIDE) 37.5-25 MG capsule TAKE 1 CAPSULE DAILY (Patient taking differently: TAKE 1 CAPSULE BY MOUTH DAILY)   . Vitamin D, Ergocalciferol, (DRISDOL) 50000 units CAPS capsule TAKE 1 CAPSULE EVERY 7 DAYS (Patient taking differently: TAKE 1 CAPSULE (50000 UNITS) BY MOUTH EVERY 7 DAYS)   . [DISCONTINUED] DULoxetine (CYMBALTA) 30 MG capsule TAKE 1 CAPSULE DAILY (Patient not taking: Reported on 03/22/2017)   . [DISCONTINUED] triamcinolone ointment (KENALOG) 0.5 % Apply 1 application topically 2 (two) times daily. (Patient not taking: Reported on 03/22/2017) 03/01/2017: Use as needed.   No facility-administered encounter medications on file as of 03/22/2017.     Functional Status: In your present state of health, do you have any difficulty performing the following activities: 03/08/2017 02/28/2017  Hearing? N N  Vision? N N  Difficulty concentrating or making decisions? N N  Comment - -  Walking or climbing stairs? N N  Dressing or bathing? N N  Doing errands, shopping? N N  Preparing Food and eating ? N -  Using the Toilet? N -  In the past  six months, have you accidently leaked urine? Y -  Comment only when i hold my urine for a long time. -  Do you have problems with loss of bowel control? Y -  Comment "sometimes" -  Managing your Medications? N -  Managing your Finances? N -  Housekeeping or managing  your Housekeeping? N -  Some recent data might be hidden    Fall/Depression Screening: Fall Risk  03/22/2017 03/08/2017 02/28/2017  Falls in the past year? Yes Yes -  Number falls in past yr: 1 1 -  Injury with Fall? No No -  Comment - - -  Risk for fall due to : - - History of fall(s)  Follow up Falls prevention discussed Falls prevention discussed;Education provided Education provided;Falls prevention discussed   PHQ 2/9 Scores 03/22/2017 03/08/2017 02/28/2017 02/27/2017 02/19/2017 02/08/2017 01/29/2017  PHQ - 2 Score 1 1 0 1 1 1 1   PHQ- 9 Score - - - - 3 - -      Assessment: Patient was recently discharged from hospital and all medications have been reviewed.   Drugs sorted by system:  Neurologic/Psychologic: Duloxetine Temazepam  Cardiovascular: Atorvastatin Azor  Triamterene-HCTZ  Pulmonary/Allergy: Combivent Respimat  Gastrointestinal: Docusate Ondansetron ODT  Endocrine: Levothyroxine   Pain: BC Headache Powder  Vitamins/Minerals: Ferrous Sulfate Vitamin D 50,000   Medication Review Findings:  Omitted medications added to patient's medication list:  -BC Headache powders-patient reports using 1-2 BC headache powders at least 3 days per week.  She was educated on the adverse effects on the GI system as well as her Kidneys.  Patient is at increased risk of acute kidney injury as she is on an ARB, diuretic (HCTZ) and aspirin (BC powder).  Iron-patient reported taking Ferrous Sulfate 325mg  1 tablet daily and it was added to her medication list.  Plan:  Route note to PCP Follow up with patient in next 7-10 business days Close pharmacy case if no further issues.  Elayne Guerin, PharmD, [place] Clinical Pharmacist [phone]

## 2017-03-26 ENCOUNTER — Other Ambulatory Visit: Payer: Self-pay

## 2017-03-26 NOTE — Patient Outreach (Signed)
[facility]) Care Management  03/26/2017  Stacy Dalton 04/14/1951 [id number]   Subjective: none  Objective: none  Assessment: 66 year old with history of HTN, chronic back pain. Recent admission 8/23-8/25 with toxic metabolic encephalopathy.  RNCM called to complete transition of care call. Client reports it is not a good time to talk. She has someone holding on her other line. Client request RNCM call back.   Plan: follow up call within 1-2 days.  Thea Silversmith, RN, MSN, [place] Coordinator Cell: [phone]

## 2017-03-27 ENCOUNTER — Other Ambulatory Visit: Payer: Self-pay

## 2017-03-27 NOTE — Patient Outreach (Signed)
[facility]) Care Management  03/27/2017  Stacy Dalton 01-19-1951 [id number]  Subjective: reports cough and light brown phlegm  Objective: none  Assessment:  66 year old with history of HTN, chronic back pain. Recent admission 8/23-8/25 with toxic metabolic encephalopathy.  RNCM called for transition of care. Client reports she has been taking care of her grandchildren and has caught something from them: sore throat; cough/light brown phlegm. She reports she has an appointment with primary care office tomorrow to address.  Plan: telephonic follow up next week.  Thea Silversmith, RN, MSN, [place] Coordinator Cell: [phone]

## 2017-03-28 ENCOUNTER — Ambulatory Visit (INDEPENDENT_AMBULATORY_CARE_PROVIDER_SITE_OTHER): Payer: Medicare Other | Admitting: Family

## 2017-03-28 ENCOUNTER — Encounter: Payer: Self-pay | Admitting: Family

## 2017-03-28 VITALS — BP 138/78 | HR 102 | Temp 97.6°F | Ht 61.0 in | Wt 116.0 lb

## 2017-03-28 DIAGNOSIS — F1120 Opioid dependence, uncomplicated: Secondary | ICD-10-CM

## 2017-03-28 DIAGNOSIS — M546 Pain in thoracic spine: Secondary | ICD-10-CM | POA: Diagnosis not present

## 2017-03-28 DIAGNOSIS — G8929 Other chronic pain: Secondary | ICD-10-CM | POA: Diagnosis not present

## 2017-03-28 DIAGNOSIS — J189 Pneumonia, unspecified organism: Secondary | ICD-10-CM | POA: Diagnosis not present

## 2017-03-28 DIAGNOSIS — Z0289 Encounter for other administrative examinations: Secondary | ICD-10-CM | POA: Diagnosis not present

## 2017-03-28 DIAGNOSIS — G894 Chronic pain syndrome: Secondary | ICD-10-CM | POA: Diagnosis not present

## 2017-03-28 DIAGNOSIS — R059 Cough, unspecified: Secondary | ICD-10-CM

## 2017-03-28 DIAGNOSIS — R05 Cough: Secondary | ICD-10-CM

## 2017-03-28 MED ORDER — TRAMADOL HCL 50 MG PO TABS
50.0000 mg | ORAL_TABLET | Freq: Two times a day (BID) | ORAL | 2 refills | Status: DC | PRN
Start: 2017-03-28 — End: 2017-06-29

## 2017-03-28 MED ORDER — PREDNISONE 10 MG (21) PO TBPK: ORAL_TABLET | ORAL | 0 refills | Status: DC

## 2017-03-28 MED ORDER — AZITHROMYCIN 250 MG PO TABS: ORAL_TABLET | ORAL | 0 refills | Status: DC

## 2017-03-28 MED ORDER — HYDROCODONE-HOMATROPINE 5-1.5 MG/5ML PO SYRP: 5.0000 mL | ORAL_SOLUTION | Freq: Three times a day (TID) | ORAL | 0 refills | Status: DC | PRN

## 2017-03-28 NOTE — Patient Instructions (Signed)

## 2017-03-28 NOTE — Progress Notes (Signed)
Subjective:    Patient ID: Stacy Dalton, female    DOB: Jan 28, 1951, 66 y.o.   MRN: [id number]  Cough  This is a new problem. The current episode started in the past 7 days. The problem has been gradually worsening. The problem occurs every few minutes. The cough is productive of sputum and productive of brown sputum. Associated symptoms include chills, ear congestion, ear pain, headaches, nasal congestion, postnasal drip, rhinorrhea, a sore throat, shortness of breath and wheezing. Pertinent negatives include no fever. The symptoms are aggravated by lying down. She has tried rest and OTC cough suppressant for the symptoms. The treatment provided mild relief.  Sore Throat   Associated symptoms include coughing, ear pain, headaches and shortness of breath.      Review of Systems  Constitutional: Positive for chills. Negative for fever.  HENT: Positive for ear pain, postnasal drip, rhinorrhea and sore throat.   Respiratory: Positive for cough, shortness of breath and wheezing.   Neurological: Positive for headaches.  All other systems reviewed and are negative.      Objective:   Physical Exam  Constitutional: She is oriented to person, place, and time. She appears well-developed and well-nourished. No distress.  HENT:  Head: Normocephalic and atraumatic.  Right Ear: External ear normal.  Left Ear: External ear normal.  Nose: Nose normal.  Mouth/Throat: Posterior oropharyngeal edema and posterior oropharyngeal erythema present.  Eyes: Pupils are equal, round, and reactive to light.  Neck: Normal range of motion. Neck supple. No thyromegaly present.  Cardiovascular: Normal rate, regular rhythm, normal heart sounds and intact distal pulses.   No murmur heard. Pulmonary/Chest: Effort normal and breath sounds normal. No respiratory distress. She has no wheezes.  Intermittent nonproductive cough  Abdominal: Soft. Bowel sounds are normal. She exhibits no distension. There is no  tenderness.  Musculoskeletal: Normal range of motion. She exhibits no edema or tenderness.  Neurological: She is alert and oriented to person, place, and time. She has normal reflexes. No cranial nerve deficit.  Skin: Skin is warm and dry.  Psychiatric: She has a normal mood and affect. Her behavior is normal. Judgment and thought content normal.  Vitals reviewed.     BP 138/78   Pulse (!) 102   Temp 97.6 F (36.4 C) (Oral)   Ht 5\' 1"  (1.549 m)   Wt 116 lb (52.6 kg)   BMI 21.92 kg/m      Assessment & Plan:  1. Cough - azithromycin (ZITHROMAX) 250 MG tablet; Take 500 mg once, then 250 mg for four days  Dispense: 6 tablet; Refill: 0 - predniSONE (STERAPRED UNI-PAK 21 TAB) 10 MG (21) TBPK tablet; Use as directed  Dispense: 21 tablet; Refill: 0 - HYDROcodone-homatropine (HYCODAN) 5-1.5 MG/5ML syrup; Take 5 mLs by mouth every 8 (eight) hours as needed for cough.  Dispense: 120 mL; Refill: 0  2. Atypical pneumonia - Take meds as prescribed - Use a cool mist humidifier  -Use saline nose sprays frequently -Force fluids -For any cough or congestion  Use plain Mucinex- regular strength or max strength is fine -For fever or aces or pains- take tylenol or ibuprofen appropriate for age and weight. -Throat lozenges if help - azithromycin (ZITHROMAX) 250 MG tablet; Take 500 mg once, then 250 mg for four days  Dispense: 6 tablet; Refill: 0 - predniSONE (STERAPRED UNI-PAK 21 TAB) 10 MG (21) TBPK tablet; Use as directed  Dispense: 21 tablet; Refill: 0   Long discussion about sedation. Pt to only take  cough syrup as needed and wait 2 hours in between her Ultram and she should not take this with her restoril.    Evelina Dun, FNP

## 2017-04-03 ENCOUNTER — Other Ambulatory Visit: Payer: Self-pay

## 2017-04-03 DIAGNOSIS — G92 Toxic encephalopathy: Secondary | ICD-10-CM | POA: Diagnosis not present

## 2017-04-03 DIAGNOSIS — F419 Anxiety disorder, unspecified: Secondary | ICD-10-CM | POA: Diagnosis not present

## 2017-04-03 DIAGNOSIS — M545 Low back pain: Secondary | ICD-10-CM | POA: Diagnosis not present

## 2017-04-03 DIAGNOSIS — J449 Chronic obstructive pulmonary disease, unspecified: Secondary | ICD-10-CM | POA: Diagnosis not present

## 2017-04-03 DIAGNOSIS — I10 Essential (primary) hypertension: Secondary | ICD-10-CM | POA: Diagnosis not present

## 2017-04-03 DIAGNOSIS — F329 Major depressive disorder, single episode, unspecified: Secondary | ICD-10-CM | POA: Diagnosis not present

## 2017-04-03 NOTE — Patient Outreach (Signed)
[place] [facility]) Care Management  04/03/2017  ALEIGHA GILANI 10-08-1950 [id number]  Subjective: "I am better now".  Objective: none  Assessment: 66 year old with history of HTN, chronic back pain. Recent admission 8/23-8/25 with toxic metabolic encephalopathy.  RNCM called for transition of care. Client reports she has been treated for bronchitis and is better. She states she has a history of COPD that was noted last year.  Transition of care program completed. Client is agreeable to COPD education/reinforcement of education.  Plan: follow up next month.   Thea Silversmith, RN, MSN, [place] Coordinator Cell: [phone]

## 2017-04-06 ENCOUNTER — Telehealth: Payer: Self-pay | Admitting: Family

## 2017-04-06 NOTE — Telephone Encounter (Signed)
Patient aware and verbalized understanding. °

## 2017-04-06 NOTE — Telephone Encounter (Signed)
I do not see Advair on patient's med list. We have discussed multiple times that I can only prescribe her Ultram. IF this is not working she will need to follow up with the pain clinic.

## 2017-04-06 NOTE — Telephone Encounter (Signed)
Patient states the Advair is not working and would like something else called into the pharmacy for her. Also states that the tramadol is not working and would like to be switched back to the [employer] Is requesting a 3-4 day supply to see if she can even handel the Norco. Please advise

## 2017-05-02 ENCOUNTER — Telehealth: Payer: Self-pay | Admitting: Family

## 2017-05-02 NOTE — Telephone Encounter (Signed)
What is the name of the medication? temazepam (RESTORIL) 30 MG capsule  Have you contacted your pharmacy to request a refill? no  Which pharmacy would you like this sent to? [facility] in [place]   Patient notified that their request is being sent to the clinical staff for review and that they should receive a call once it is complete. If they do not receive a call within 24 hours they can check with their pharmacy or our office.

## 2017-05-02 NOTE — Telephone Encounter (Signed)
Please review and advise.

## 2017-05-03 ENCOUNTER — Ambulatory Visit (INDEPENDENT_AMBULATORY_CARE_PROVIDER_SITE_OTHER): Payer: Medicare Other | Admitting: Family

## 2017-05-03 ENCOUNTER — Encounter: Payer: Self-pay | Admitting: Family

## 2017-05-03 VITALS — BP 130/73 | HR 96 | Temp 98.7°F | Ht 61.0 in | Wt 116.0 lb

## 2017-05-03 DIAGNOSIS — R05 Cough: Secondary | ICD-10-CM

## 2017-05-03 DIAGNOSIS — J449 Chronic obstructive pulmonary disease, unspecified: Secondary | ICD-10-CM

## 2017-05-03 DIAGNOSIS — J441 Chronic obstructive pulmonary disease with (acute) exacerbation: Secondary | ICD-10-CM

## 2017-05-03 DIAGNOSIS — R059 Cough, unspecified: Secondary | ICD-10-CM

## 2017-05-03 MED ORDER — TEMAZEPAM 30 MG PO CAPS: 30.0000 mg | ORAL_CAPSULE | Freq: Every evening | ORAL | 4 refills | Status: DC | PRN

## 2017-05-03 MED ORDER — HYDROCODONE-HOMATROPINE 5-1.5 MG/5ML PO SYRP: 5.0000 mL | ORAL_SOLUTION | Freq: Three times a day (TID) | ORAL | 0 refills | Status: DC | PRN

## 2017-05-03 MED ORDER — GUAIFENESIN ER 600 MG PO TB12: 600.0000 mg | ORAL_TABLET | Freq: Two times a day (BID) | ORAL | 3 refills | Status: DC

## 2017-05-03 MED ORDER — BENZONATATE 200 MG PO CAPS: 200.0000 mg | ORAL_CAPSULE | Freq: Three times a day (TID) | ORAL | 1 refills | Status: DC | PRN

## 2017-05-03 NOTE — Progress Notes (Signed)
Subjective:    Patient ID: Stacy Dalton, female    DOB: [birth year], 66 y.o.   MRN: [id number]  Sinus Problem  Associated symptoms include coughing, ear pain (left), headaches and a sore throat. Pertinent negatives include no chills.  Cough  This is a new problem. The current episode started 1 to 4 weeks ago. The problem has been gradually worsening. The problem occurs every few minutes. The cough is productive of sputum. Associated symptoms include ear pain (left), headaches, myalgias, nasal congestion, postnasal drip, rhinorrhea, a sore throat and wheezing. Pertinent negatives include no chills, ear congestion or fever. The symptoms are aggravated by lying down. She has tried rest for the symptoms. The treatment provided mild relief. Her past medical history is significant for COPD.      Review of Systems  Constitutional: Negative for chills and fever.  HENT: Positive for ear pain (left), postnasal drip, rhinorrhea and sore throat.   Respiratory: Positive for cough and wheezing.   Musculoskeletal: Positive for myalgias.  Neurological: Positive for headaches.  All other systems reviewed and are negative.      Objective:   Physical Exam  Constitutional: She is oriented to person, place, and time. She appears well-developed and well-nourished. No distress.  HENT:  Head: Normocephalic and atraumatic.  Right Ear: External ear normal.  Left Ear: External ear normal.  Nose: Mucosal edema and rhinorrhea present. Right sinus exhibits maxillary sinus tenderness. Right sinus exhibits no frontal sinus tenderness. Left sinus exhibits maxillary sinus tenderness. Left sinus exhibits no frontal sinus tenderness.  Eyes: Pupils are equal, round, and reactive to light.  Neck: Normal range of motion. Neck supple. No thyromegaly present.  Cardiovascular: Normal rate, regular rhythm, normal heart sounds and intact distal pulses.   No murmur heard. Pulmonary/Chest: Effort normal and breath sounds  normal. No respiratory distress. She has no wheezes.  Dry intermittent nonproductive cough  Abdominal: Soft. Bowel sounds are normal. She exhibits no distension. There is no tenderness.  Musculoskeletal: Normal range of motion. She exhibits no edema or tenderness.  Neurological: She is alert and oriented to person, place, and time.  Skin: Skin is warm and dry.  Psychiatric: She has a normal mood and affect. Her behavior is normal. Judgment and thought content normal.  Vitals reviewed.     BP 130/73   Pulse 96   Temp 98.7 F (37.1 C) (Oral)   Ht 5\' 1"  (1.549 m)   Wt 116 lb (52.6 kg)   BMI 21.92 kg/m      Assessment & Plan:  1. Chronic obstructive pulmonary disease, unspecified COPD type ([place]) - benzonatate (TESSALON) 200 MG capsule; Take 1 capsule (200 mg total) by mouth 3 (three) times daily as needed.  Dispense: 30 capsule; Refill: 1 - guaiFENesin (MUCINEX) 600 MG 12 hr tablet; Take 1 tablet (600 mg total) by mouth 2 (two) times daily.  Dispense: 60 tablet; Refill: 3  2. Cough - benzonatate (TESSALON) 200 MG capsule; Take 1 capsule (200 mg total) by mouth 3 (three) times daily as needed.  Dispense: 30 capsule; Refill: 1 - guaiFENesin (MUCINEX) 600 MG 12 hr tablet; Take 1 tablet (600 mg total) by mouth 2 (two) times daily.  Dispense: 60 tablet; Refill: 3 - HYDROcodone-homatropine (HYCODAN) 5-1.5 MG/5ML syrup; Take 5 mLs by mouth every 8 (eight) hours as needed for cough.  Dispense: 120 mL; Refill: 0  3. COPD exacerbation (HCC) - benzonatate (TESSALON) 200 MG capsule; Take 1 capsule (200 mg total) by mouth 3 (three)  times daily as needed.  Dispense: 30 capsule; Refill: 1 - guaiFENesin (MUCINEX) 600 MG 12 hr tablet; Take 1 tablet (600 mg total) by mouth 2 (two) times daily.  Dispense: 60 tablet; Refill: 3  Rest Force fluids Start Mucinex RTO prn   Stacy Dun, FNP

## 2017-05-03 NOTE — Patient Instructions (Signed)
Chronic Bronchitis Chronic bronchitis is a lasting inflammation of the bronchial tubes, which are the tubes that carry air into your lungs. This is inflammation that occurs:  On most days of the week.  For at least three months at a time.  Over a period of two years in a row.  When the bronchial tubes are inflamed, they start to produce mucus. The inflammation and buildup of mucus make it more difficult to breathe. Chronic bronchitis is usually a permanent problem and is one type of chronic obstructive pulmonary disease (COPD). People with chronic bronchitis are at greater risk for getting repeated colds, or respiratory infections. What are the causes? Chronic bronchitis most often occurs in people who have:  Long-standing, severe asthma.  A history of smoking.  Asthma and who also smoke.  What are the signs or symptoms? Chronic bronchitis may cause the following:  A cough that brings up mucus (productive cough).  Shortness of breath.  Early morning headache.  Wheezing.  Chest discomfort.  Recurring respiratory infections.  How is this diagnosed? Your health care provider may confirm the diagnosis by:  Taking your medical history.  Performing a physical exam.  Taking a chest X-ray.  Performing pulmonary function tests.  How is this treated? Treatment involves controlling symptoms with medicines, oxygen therapy, or making lifestyle changes, such as exercising and eating a healthy, well-balanced diet. Medicines could include:  Inhalers to improve air flow in and out of your lungs.  Antibiotics to treat bacterial infections, such as pneumonia, sinus infections, and acute bronchitis.  As a preventative measure, your health care provider may recommend routine vaccinations for influenza and pneumonia. This is to prevent infection and hospitalization since you may be more at risk for these types of infections. Follow these instructions at home:  Take medicines only as  directed by your health care provider.  If you smoke cigarettes, chew tobacco, or use electronic cigarettes, quit. If you need help quitting, ask your health care provider.  Avoid pollen, dust, animal dander, molds, smoke, and other things that cause shortness of breath or wheezing attacks.  Talk to your health care provider about possible exercise routines. Regular exercise is very important to help you feel better.  If you are prescribed oxygen use at home follow these guidelines: ? Never smoke while using oxygen. Oxygen does not burn or explode, but flammable materials will burn faster in the presence of oxygen. ? Keep a fire extinguisher close by. Let your fire department know that you have oxygen in your home. ? Warn visitors not to smoke near you when you are using oxygen. Put up "no smoking" signs in your home where you most often use the oxygen. ? Regularly test your smoke detectors at home to make sure they work. If you receive care in your home from a nurse or other health care provider, he or she may also check to make sure your smoke detectors work.  Ask your health care provider whether you would benefit from a pulmonary rehabilitation program.  Do not wait to get medical care if you have any concerning symptoms. Delays could cause permanent injury and may be life threatening. Contact a health care provider if:  You have increased coughing or shortness of breath or both.  You have muscle aches.  You have chest pain.  Your mucus gets thicker.  Your mucus changes from clear or white to yellow, green, gray, or bloody. Get help right away if:  Your usual medicines do not stop   your wheezing.  You have increased difficulty breathing.  You have any problems with the medicine you are taking, such as a rash, itching, swelling, or trouble breathing. This information is not intended to replace advice given to you by your health care provider. Make sure you discuss any questions  you have with your health care provider. Document Released: 04/06/2006 Document Revised: 10/28/2015 Document Reviewed: 07/28/2013 Elsevier Interactive Patient Education  2017 Elsevier Inc.  

## 2017-05-07 ENCOUNTER — Other Ambulatory Visit: Payer: Self-pay

## 2017-05-07 NOTE — Patient Outreach (Signed)
[place] [facility]) Care Management  05/07/2017  Stacy Dalton [birth year] [id number]   Subjective: "Im alright now".  Objective: none  Assessment: 66 year old with history of HTN, chronic back pain. Recent admission 8/23-8/25 with toxic metabolic encephalopathy.   Client denies any questions or concern regarding medications.   She has recently been seen by primary care regarding bronchitis. She was last seen by primary care on 11/4. Client reports she is better, denies cough or sputum production at this time. RNCM has discussed COPD disease management. Client is agreeable.  Client also reports this time of year she always has some depression. Last depression screen completed at primary care office 11/4. Client reports she is taking her medication. Client decline any additional resource needs. Declines social work referral. Client to follow up with provider as needed.  Plan: transition to Health coach for COPD disease management.  Thea Silversmith, RN, MSN, [place] Coordinator Cell: [phone]

## 2017-05-08 ENCOUNTER — Other Ambulatory Visit: Payer: Self-pay

## 2017-05-08 NOTE — Patient Outreach (Signed)
[place] [facility]) Care Management  05/08/2017  Stacy Dalton 25-Aug-1950 [id number]   Telephone call to patient for initial assessment.  No answer. HIPAA compliant voice message left.  Plan: RN Health Coach will make outreach attempt to patient within five business days.  Lazaro Arms RN, BSN, [place] Direct Dial:  [phone] Fax: [phone]

## 2017-05-15 ENCOUNTER — Other Ambulatory Visit: Payer: Self-pay

## 2017-05-15 NOTE — Patient Outreach (Addendum)
[place] [facility]) Care Management  [place]  05/15/2017   Stacy Dalton 02/02/1951 [id number]  Subjective: RN Health Coach spoke with the patient for initial assessment. HIPAA verified with patient. Discussed and offered [facility] care management services. Patient verbally agreed to services. The patient lives alone. She states that she is able to perform her ADLS'/IADLS'.  She is adherent with all of her medications except Lipitor.  She is concerned about it causing muscle aches. RN Health Coach advised her to discuss her concerns at her next appointment June 08 2017.    Objective:   Encounter Medications:  Outpatient Encounter Medications as of 05/15/2017  Medication Sig Note  . Aspirin-Salicylamide-Caffeine (BC HEADACHE POWDER PO) Take by mouth. Use 1-2 packs daily PRN headache   . AZOR 10-40 MG tablet TAKE 1 TABLET DAILY (Patient taking differently: TAKE 1 TABLET BY MOUTH DAILY)   . docusate sodium (COLACE) 100 MG capsule Take 1 capsule (100 mg total) by mouth 2 (two) times daily as needed for mild constipation. 03/22/2017: ONLY TAKING PRN  . DULoxetine (CYMBALTA) 60 MG capsule Take 1 capsule (60 mg total) by mouth daily.   . ferrous sulfate 325 (65 FE) MG tablet Take 325 mg by mouth daily with breakfast.   . Fluticasone-Salmeterol (ADVAIR) 100-50 MCG/DOSE AEPB Inhale 1 puff 2 (two) times daily into the lungs.   . Ipratropium-Albuterol (COMBIVENT RESPIMAT) 20-100 MCG/ACT AERS respimat Inhale 1 puff into the lungs every 6 (six) hours as needed for wheezing. 03/22/2017: PRN  . levothyroxine (SYNTHROID, LEVOTHROID) 50 MCG tablet TAKE 1 TABLET DAILY (Patient taking differently: TAKE 1 TABLET (50 MCG) BY MOUTH DAILY)   . ondansetron (ZOFRAN ODT) 4 MG disintegrating tablet Take 1 tablet (4 mg total) by mouth every 8 (eight) hours as needed for nausea or vomiting.   . temazepam (RESTORIL) 30 MG capsule Take 1 capsule (30 mg total) by mouth at bedtime as needed for sleep.    . traMADol (ULTRAM) 50 MG tablet Take 1-2 tablets (50-100 mg total) by mouth every 12 (twelve) hours as needed (pain).   . triamterene-hydrochlorothiazide (DYAZIDE) 37.5-25 MG capsule TAKE 1 CAPSULE DAILY (Patient taking differently: TAKE 1 CAPSULE BY MOUTH DAILY)   . Vitamin D, Ergocalciferol, (DRISDOL) 50000 units CAPS capsule TAKE 1 CAPSULE EVERY 7 DAYS (Patient taking differently: TAKE 1 CAPSULE (50000 UNITS) BY MOUTH EVERY 7 DAYS)   . atorvastatin (LIPITOR) 40 MG tablet TAKE ONE-HALF (1/2) TABLET DAILY (CHANGE IN DIRECTIONS) (Patient not taking: Reported on 05/15/2017) 05/15/2017: Patient states she will speak with her physician.  . benzonatate (TESSALON) 200 MG capsule Take 1 capsule (200 mg total) by mouth 3 (three) times daily as needed. (Patient not taking: Reported on 05/15/2017)   . guaiFENesin (MUCINEX) 600 MG 12 hr tablet Take 1 tablet (600 mg total) by mouth 2 (two) times daily. (Patient not taking: Reported on 05/15/2017)   . HYDROcodone-homatropine (HYCODAN) 5-1.5 MG/5ML syrup Take 5 mLs by mouth every 8 (eight) hours as needed for cough. (Patient not taking: Reported on 05/15/2017)    No facility-administered encounter medications on file as of 05/15/2017.     Functional Status:  In your present state of health, do you have any difficulty performing the following activities: 03/08/2017 02/28/2017  Hearing? N N  Vision? N N  Difficulty concentrating or making decisions? N N  Comment - -  Walking or climbing stairs? N N  Dressing or bathing? N N  Doing errands, shopping? N N  Conservation officer, nature and  eating ? N -  Using the Toilet? N -  In the past six months, have you accidently leaked urine? Y -  Comment only when i hold my urine for a long time. -  Do you have problems with loss of bowel control? Y -  Comment "sometimes" -  Managing your Medications? N -  Managing your Finances? N -  Housekeeping or managing your Housekeeping? N -  Some recent data might be hidden     Fall/Depression Screening: Fall Risk  05/15/2017 05/03/2017 03/28/2017  Falls in the past year? Yes Yes Yes  Number falls in past yr: 1 1 1   Injury with Fall? No No No  Comment - - -  Risk for fall due to : - - -  Follow up - - -   PHQ 2/9 Scores 05/15/2017 05/03/2017 03/28/2017 03/22/2017 03/08/2017 02/28/2017 02/27/2017  PHQ - 2 Score 2 2 2 1 1  0 1  PHQ- 9 Score 7 6 7  - - - -    Assessment: Patient will benefit from health coach outreach for disease management and support.   THN CM Care Plan Problem One     Most Recent Value  Care Plan Problem One  Knowledge deficit related to COPD action plan  Role Documenting the Problem One  Health Coach  Care Plan for Problem One  Active  THN Long Term Goal   In 90 days the patient will be able to explain the COPD action plan  THN Long Term Goal Start Date  05/15/17  Interventions for Problem One [place] will send  information about the COPD action plan   THN CM Short Term Goal #1   In 30 days the patient will verbalize that she has received her flu shot  THN CM Short Term Goal #1 Start Date  05/15/17  Interventions for Short Term Goal #1  RN Health Coach discussed with the patient the importance of receiveing her flu shot.  THN CM Short Term Goal #2   In 30 days the patient will be able to verbalize one way to help relieve stress.  THN CM Short Term Goal #2 Start Date  05/15/17  Interventions for Short Term Goal #2  [place] will send educational information for the patient to review       Plan: [place] will provide ongoing education for patient on COPD through phone calls and sending printed information to patient for further discussion.  RN Health Coach will send welcome packet with consent to patient as well as printed information on COPD.  RN Health Coach will send initial barriers letter, assessment, and care plan to primary care physician.RN Health Coach will contact patient in the month of December  and patient agrees to next outreach.  Lazaro Arms RN, BSN, [place] Direct Dial:  [phone] Fax: [phone]

## 2017-05-15 NOTE — Patient Outreach (Signed)
[place] [facility]) Care Management  05/15/2017  Stacy Dalton 1951-02-03 [id number]   2nd Telephone call to patient for Initial assessment.  No answer. HIPAA compliant voice message left.  Plan: RN Health Coach will make outreach attempt to patient within five business days.  Lazaro Arms RN, BSN, [place] Direct Dial:  [phone] Fax: [phone]

## 2017-05-15 NOTE — Patient Outreach (Signed)
[place] [facility]) Care Management  05/15/2017  Stacy Dalton Feb 16, 1951 [id number]  Returning call to the patient for initial assessment. HIPAA verified. The patient stated that she was in the middle of making calls and asked if I could call her this afternoon.   Plan: RN Health Coach will attempt to call the patient back this afternoon. If no response a letter will be mailed to the patient.  After 10 days of no contact the case will be closed at that time.   Lazaro Arms RN, BSN, [place] Direct Dial:  [phone] Fax: [phone]

## 2017-05-17 ENCOUNTER — Other Ambulatory Visit: Payer: Self-pay | Admitting: Family

## 2017-05-17 DIAGNOSIS — G8929 Other chronic pain: Secondary | ICD-10-CM

## 2017-05-17 DIAGNOSIS — M546 Pain in thoracic spine: Principal | ICD-10-CM

## 2017-05-22 NOTE — Telephone Encounter (Signed)
This encounter was created in error - please disregard.

## 2017-05-30 ENCOUNTER — Encounter: Payer: Self-pay | Admitting: Physician Assistant

## 2017-05-30 ENCOUNTER — Ambulatory Visit (INDEPENDENT_AMBULATORY_CARE_PROVIDER_SITE_OTHER): Payer: Medicare Other | Admitting: Physician Assistant

## 2017-05-30 VITALS — BP 119/79 | HR 109 | Temp 97.9°F | Ht 61.0 in | Wt 120.0 lb

## 2017-05-30 DIAGNOSIS — J209 Acute bronchitis, unspecified: Secondary | ICD-10-CM

## 2017-05-30 DIAGNOSIS — R059 Cough, unspecified: Secondary | ICD-10-CM

## 2017-05-30 DIAGNOSIS — J440 Chronic obstructive pulmonary disease with acute lower respiratory infection: Secondary | ICD-10-CM

## 2017-05-30 DIAGNOSIS — M546 Pain in thoracic spine: Secondary | ICD-10-CM | POA: Diagnosis not present

## 2017-05-30 DIAGNOSIS — R05 Cough: Secondary | ICD-10-CM | POA: Diagnosis not present

## 2017-05-30 DIAGNOSIS — G8929 Other chronic pain: Secondary | ICD-10-CM | POA: Diagnosis not present

## 2017-05-30 MED ORDER — CEFDINIR 300 MG PO CAPS: 300.0000 mg | ORAL_CAPSULE | Freq: Two times a day (BID) | ORAL | 0 refills | Status: DC

## 2017-05-30 MED ORDER — ALBUTEROL SULFATE HFA 108 (90 BASE) MCG/ACT IN AERS: 2.0000 | INHALATION_SPRAY | Freq: Four times a day (QID) | RESPIRATORY_TRACT | 0 refills | Status: DC | PRN

## 2017-05-30 MED ORDER — TIOTROPIUM BROMIDE MONOHYDRATE 18 MCG IN CAPS: 18.0000 ug | ORAL_CAPSULE | Freq: Every day | RESPIRATORY_TRACT | 12 refills | Status: DC

## 2017-05-30 MED ORDER — HYDROCODONE-HOMATROPINE 5-1.5 MG/5ML PO SYRP: 5.0000 mL | ORAL_SOLUTION | Freq: Three times a day (TID) | ORAL | 0 refills | Status: DC | PRN

## 2017-05-30 MED ORDER — ONDANSETRON 4 MG PO TBDP: 4.0000 mg | ORAL_TABLET | Freq: Three times a day (TID) | ORAL | 2 refills | Status: DC | PRN

## 2017-05-30 MED ORDER — PREDNISONE 10 MG (21) PO TBPK: ORAL_TABLET | ORAL | 0 refills | Status: DC

## 2017-05-30 NOTE — Patient Instructions (Signed)
In a few days you may receive a survey in the mail or online from [employer] regarding your visit with us today. Please take a moment to fill this out. Your feedback is very important to our whole office. It can help us better understand your needs as well as improve your experience and satisfaction. Thank you for taking your time to complete it. We care about you.  Mildred Tuccillo, PA-C  

## 2017-05-30 NOTE — Progress Notes (Signed)
BP 119/79   Pulse (!) 109   Temp 97.9 F (36.6 C) (Oral)   Ht 5\' 1"  (1.549 m)   Wt 120 lb (54.4 kg)   BMI 22.67 kg/m    Subjective:    Patient ID: Stacy Dalton, female    DOB: May 15, 1951, 66 y.o.   MRN: [id number]  HPI: Stacy Dalton is a 66 y.o. female presenting on 05/30/2017 for Cough (for six days)  Patient with several days of progressing upper respiratory and bronchial symptoms. Initially there was more upper respiratory congestion. This progressed to having significant cough that is productive throughout the day and severe at night. There is occasional wheezing after coughing. Sometimes there is slight dyspnea on exertion. It is productive mucus that is yellow in color. Denies any blood.  Patient has known COPD. She is currently only on Advair. She is not taking Combivent. I'm going to add in Spiriva and an albuterol inhaler. This prescription will be sent to her pharmacy.  Relevant past medical, surgical, family and social history reviewed and updated as indicated. Allergies and medications reviewed and updated.  Past Medical History:  Diagnosis Date  . COPD (chronic obstructive pulmonary disease) ([place])    told has copd, no current inhaler use  . Depression   . GERD (gastroesophageal reflux disease)   . Hypertension   . Hypothyroidism   . Insomnia   . Migraine   . Migraine   . Osteopenia   . Panic attacks     Past Surgical History:  Procedure Laterality Date  . ABDOMINAL HYSTERECTOMY    . COLON SURGERY    . COLOSTOMY CLOSURE  04/19/2012   Procedure: COLOSTOMY CLOSURE;  Surgeon: Adin Hector, MD;  Location: [facility];  Service: General;  Laterality: N/A;  Laparotomy, Resection and Closure of Colostomy  . COLOSTOMY TAKEDOWN N/A 04/12/2016   Procedure: [place] TAKEDOWN;  Surgeon: Johnathan Hausen, MD;  Location: [facility];  Service: General;  Laterality: N/A;  . INCONTINENCE SURGERY    . LAPAROTOMY  10/04/2011, colostomy also   Procedure: EXPLORATORY  LAPAROTOMY;  Surgeon: Adin Hector, MD;  Location: [facility];  Service: General;  Laterality: N/A;  left partial colectomy with colostomy  . LAPAROTOMY  04/19/2012   Procedure: EXPLORATORY LAPAROTOMY;  Surgeon: Adin Hector, MD;  Location: [facility];  Service: General;  Laterality: N/A;  . LAPAROTOMY N/A 11/29/2015   Procedure: EXPLORATORY LAPAROTOMY; SUBTOTAL COLECTOMY WITH HARTMAN PROCEDURE AND END COLOSTOMY;  Surgeon: Johnathan Hausen, MD;  Location: [facility];  Service: General;  Laterality: N/A;  . TUBAL LIGATION    . VENTRAL HERNIA REPAIR  04/19/2012   Procedure: HERNIA REPAIR VENTRAL ADULT;  Surgeon: Adin Hector, MD;  Location: [facility];  Service: General;  Laterality: N/A;    Review of Systems  Constitutional: Positive for chills and fatigue. Negative for activity change, appetite change and fever.  HENT: Positive for congestion, postnasal drip and sore throat.   Eyes: Negative.   Respiratory: Positive for cough, shortness of breath and wheezing.   Cardiovascular: Negative.  Negative for chest pain, palpitations and leg swelling.  Gastrointestinal: Negative.   Genitourinary: Negative.   Musculoskeletal: Negative.   Skin: Negative.   Neurological: Positive for headaches.    Allergies as of 05/30/2017      Reactions   Toradol [ketorolac Tromethamine] Shortness Of Breath   Demerol Swelling   Esgic [butalbital-apap-caffeine] Other (See Comments)   jittery   Iodine Other (See Comments)   bp bottomed out  per pt several hours later; unsure if pre medicated in past with cm; done in [place]      Medication List        Accurate as of 05/30/17  3:35 PM. Always use your most recent med list.          albuterol 108 (90 Base) MCG/ACT inhaler Commonly known as:  PROVENTIL HFA;VENTOLIN HFA Inhale 2 puffs into the lungs every 6 (six) hours as needed for wheezing or shortness of breath.   atorvastatin 40 MG tablet Commonly known as:  LIPITOR TAKE ONE-HALF (1/2) TABLET DAILY (CHANGE  IN DIRECTIONS)   AZOR 10-40 MG tablet Generic drug:  amLODipine-olmesartan TAKE 1 TABLET DAILY   BC HEADACHE POWDER PO Take by mouth. Use 1-2 packs daily PRN headache   benzonatate 200 MG capsule Commonly known as:  TESSALON Take 1 capsule (200 mg total) by mouth 3 (three) times daily as needed.   cefdinir 300 MG capsule Commonly known as:  OMNICEF Take 1 capsule (300 mg total) by mouth 2 (two) times daily. 1 po BID   COMBIVENT RESPIMAT 20-100 MCG/ACT Aers respimat Generic drug:  Ipratropium-Albuterol Inhale 1 puff into the lungs every 6 (six) hours as needed for wheezing.   docusate sodium 100 MG capsule Commonly known as:  COLACE Take 1 capsule (100 mg total) by mouth 2 (two) times daily as needed for mild constipation.   DULoxetine 60 MG capsule Commonly known as:  CYMBALTA Take 1 capsule (60 mg total) by mouth daily.   ferrous sulfate 325 (65 FE) MG tablet Take 325 mg by mouth daily with breakfast.   Fluticasone-Salmeterol 100-50 MCG/DOSE Aepb Commonly known as:  ADVAIR Inhale 1 puff 2 (two) times daily into the lungs.   guaiFENesin 600 MG 12 hr tablet Commonly known as:  MUCINEX Take 1 tablet (600 mg total) by mouth 2 (two) times daily.   HYDROcodone-homatropine 5-1.5 MG/5ML syrup Commonly known as:  HYCODAN Take 5 mLs by mouth every 8 (eight) hours as needed for cough.   levothyroxine 50 MCG tablet Commonly known as:  SYNTHROID, LEVOTHROID TAKE 1 TABLET DAILY   ondansetron 4 MG disintegrating tablet Commonly known as:  ZOFRAN-ODT Take 1 tablet (4 mg total) by mouth every 8 (eight) hours as needed.   predniSONE 10 MG (21) Tbpk tablet Commonly known as:  STERAPRED UNI-PAK 21 TAB As directed x 6 days   temazepam 30 MG capsule Commonly known as:  RESTORIL Take 1 capsule (30 mg total) by mouth at bedtime as needed for sleep.   tiotropium 18 MCG inhalation capsule Commonly known as:  SPIRIVA HANDIHALER Place 1 capsule (18 mcg total) into inhaler and  inhale daily.   traMADol 50 MG tablet Commonly known as:  ULTRAM Take 1-2 tablets (50-100 mg total) by mouth every 12 (twelve) hours as needed (pain).   triamterene-hydrochlorothiazide 37.5-25 MG capsule Commonly known as:  DYAZIDE TAKE 1 CAPSULE DAILY   Vitamin D (Ergocalciferol) 50000 units Caps capsule Commonly known as:  DRISDOL TAKE 1 CAPSULE EVERY 7 DAYS          Objective:    BP 119/79   Pulse (!) 109   Temp 97.9 F (36.6 C) (Oral)   Ht 5\' 1"  (1.549 m)   Wt 120 lb (54.4 kg)   BMI 22.67 kg/m   Allergies  Allergen Reactions  . Toradol [Ketorolac Tromethamine] Shortness Of Breath  . Demerol Swelling  . Esgic [Butalbital-Apap-Caffeine] Other (See Comments)    jittery  . Iodine  Other (See Comments)    bp bottomed out per pt several hours later; unsure if pre medicated in past with cm; done in [place]    Physical Exam  Constitutional: She is oriented to person, place, and time. She appears well-developed and well-nourished.  HENT:  Head: Normocephalic and atraumatic.  Right Ear: There is drainage and tenderness.  Left Ear: There is drainage and tenderness.  Nose: Mucosal edema and rhinorrhea present. Right sinus exhibits maxillary sinus tenderness and frontal sinus tenderness. Left sinus exhibits maxillary sinus tenderness and frontal sinus tenderness.  Mouth/Throat: Oropharyngeal exudate and posterior oropharyngeal erythema present.  Eyes: Conjunctivae and EOM are normal. Pupils are equal, round, and reactive to light.  Neck: Normal range of motion. Neck supple.  Cardiovascular: Normal rate, regular rhythm, normal heart sounds and intact distal pulses.  Pulmonary/Chest: Effort normal. She has wheezes in the right upper field and the left upper field.  Abdominal: Soft. Bowel sounds are normal.  Neurological: She is alert and oriented to person, place, and time. She has normal reflexes.  Skin: Skin is warm and dry. No rash noted.  Psychiatric: She has a normal mood  and affect. Her behavior is normal. Judgment and thought content normal.        Assessment & Plan:   1. Acute bronchitis with COPD ([place]) - cefdinir (OMNICEF) 300 MG capsule; Take 1 capsule (300 mg total) by mouth 2 (two) times daily. 1 po BID  Dispense: 20 capsule; Refill: 0 - predniSONE (STERAPRED UNI-PAK 21 TAB) 10 MG (21) TBPK tablet; As directed x 6 days  Dispense: 21 tablet; Refill: 0 - HYDROcodone-homatropine (HYCODAN) 5-1.5 MG/5ML syrup; Take 5 mLs by mouth every 8 (eight) hours as needed for cough.  Dispense: 120 mL; Refill: 0  2. Chronic bilateral thoracic back pain - ondansetron (ZOFRAN-ODT) 4 MG disintegrating tablet; Take 1 tablet (4 mg total) by mouth every 8 (eight) hours as needed.  Dispense: 30 tablet; Refill: 2  3. Cough - HYDROcodone-homatropine (HYCODAN) 5-1.5 MG/5ML syrup; Take 5 mLs by mouth every 8 (eight) hours as needed for cough.  Dispense: 120 mL; Refill: 0    Current Outpatient Medications:  .  albuterol (PROVENTIL HFA;VENTOLIN HFA) 108 (90 Base) MCG/ACT inhaler, Inhale 2 puffs into the lungs every 6 (six) hours as needed for wheezing or shortness of breath., Disp: 1 Inhaler, Rfl: 0 .  Aspirin-Salicylamide-Caffeine (BC HEADACHE POWDER PO), Take by mouth. Use 1-2 packs daily PRN headache, Disp: , Rfl:  .  atorvastatin (LIPITOR) 40 MG tablet, TAKE ONE-HALF (1/2) TABLET DAILY (CHANGE IN DIRECTIONS), Disp: 90 tablet, Rfl: 1 .  AZOR 10-40 MG tablet, TAKE 1 TABLET DAILY (Patient taking differently: TAKE 1 TABLET BY MOUTH DAILY), Disp: 90 tablet, Rfl: 1 .  benzonatate (TESSALON) 200 MG capsule, Take 1 capsule (200 mg total) by mouth 3 (three) times daily as needed., Disp: 30 capsule, Rfl: 1 .  cefdinir (OMNICEF) 300 MG capsule, Take 1 capsule (300 mg total) by mouth 2 (two) times daily. 1 po BID, Disp: 20 capsule, Rfl: 0 .  docusate sodium (COLACE) 100 MG capsule, Take 1 capsule (100 mg total) by mouth 2 (two) times daily as needed for mild constipation., Disp: 10  capsule, Rfl: 0 .  DULoxetine (CYMBALTA) 60 MG capsule, Take 1 capsule (60 mg total) by mouth daily., Disp: 90 capsule, Rfl: 1 .  ferrous sulfate 325 (65 FE) MG tablet, Take 325 mg by mouth daily with breakfast., Disp: , Rfl:  .  Fluticasone-Salmeterol (ADVAIR) 100-50  MCG/DOSE AEPB, Inhale 1 puff 2 (two) times daily into the lungs., Disp: , Rfl:  .  guaiFENesin (MUCINEX) 600 MG 12 hr tablet, Take 1 tablet (600 mg total) by mouth 2 (two) times daily., Disp: 60 tablet, Rfl: 3 .  HYDROcodone-homatropine (HYCODAN) 5-1.5 MG/5ML syrup, Take 5 mLs by mouth every 8 (eight) hours as needed for cough., Disp: 120 mL, Rfl: 0 .  Ipratropium-Albuterol (COMBIVENT RESPIMAT) 20-100 MCG/ACT AERS respimat, Inhale 1 puff into the lungs every 6 (six) hours as needed for wheezing., Disp: , Rfl:  .  levothyroxine (SYNTHROID, LEVOTHROID) 50 MCG tablet, TAKE 1 TABLET DAILY (Patient taking differently: TAKE 1 TABLET (50 MCG) BY MOUTH DAILY), Disp: 90 tablet, Rfl: 1 .  ondansetron (ZOFRAN-ODT) 4 MG disintegrating tablet, Take 1 tablet (4 mg total) by mouth every 8 (eight) hours as needed., Disp: 30 tablet, Rfl: 2 .  predniSONE (STERAPRED UNI-PAK 21 TAB) 10 MG (21) TBPK tablet, As directed x 6 days, Disp: 21 tablet, Rfl: 0 .  temazepam (RESTORIL) 30 MG capsule, Take 1 capsule (30 mg total) by mouth at bedtime as needed for sleep., Disp: 30 capsule, Rfl: 4 .  tiotropium (SPIRIVA HANDIHALER) 18 MCG inhalation capsule, Place 1 capsule (18 mcg total) into inhaler and inhale daily., Disp: 30 capsule, Rfl: 12 .  traMADol (ULTRAM) 50 MG tablet, Take 1-2 tablets (50-100 mg total) by mouth every 12 (twelve) hours as needed (pain)., Disp: 120 tablet, Rfl: 2 .  triamterene-hydrochlorothiazide (DYAZIDE) 37.5-25 MG capsule, TAKE 1 CAPSULE DAILY (Patient taking differently: TAKE 1 CAPSULE BY MOUTH DAILY), Disp: 90 capsule, Rfl: 1 .  Vitamin D, Ergocalciferol, (DRISDOL) 50000 units CAPS capsule, TAKE 1 CAPSULE EVERY 7 DAYS (Patient taking  differently: TAKE 1 CAPSULE (50000 UNITS) BY MOUTH EVERY 7 DAYS), Disp: 12 capsule, Rfl: 3 Continue all other maintenance medications as listed above.  Follow up plan: Keep follow-up with PCP.  Educational handout given for [place] PA-C [place] [address]  [place], [place] [address] [phone]   05/30/2017, 3:35 PM

## 2017-05-31 ENCOUNTER — Telehealth: Payer: Self-pay | Admitting: Family

## 2017-05-31 NOTE — Telephone Encounter (Signed)
Phone not accepting messages,

## 2017-05-31 NOTE — Telephone Encounter (Signed)
Aware. Use good hand hygiene  and disinfectants to keep down spread of any virus or infections.

## 2017-06-02 ENCOUNTER — Other Ambulatory Visit: Payer: Self-pay | Admitting: Family

## 2017-06-03 ENCOUNTER — Other Ambulatory Visit: Payer: Self-pay | Admitting: Family

## 2017-06-06 ENCOUNTER — Other Ambulatory Visit: Payer: Self-pay | Admitting: Pharmacist

## 2017-06-06 NOTE — Patient Outreach (Signed)
[facility]) Care Management  06/06/2017  Stacy Dalton 01/07/1951 [id number]  Patient was called to follow up and to perhaps close the patient's case. HIPAA identifiers were obtained.  However, patient said she was asleep and not feeling well and requested I call back later this afternoon. Patient was called back just before 1pm and unfortunately there was no answer. HIPAA compliant message left on her voicemail.   Plan:  Call patient back in 5-7 business days. Consider closing the pharmacy case.  Elayne Guerin, PharmD, [place] Clinical Pharmacist [phone]

## 2017-06-07 ENCOUNTER — Other Ambulatory Visit: Payer: Self-pay | Admitting: Family

## 2017-06-08 ENCOUNTER — Ambulatory Visit: Payer: Medicare Other | Admitting: Family

## 2017-06-13 ENCOUNTER — Other Ambulatory Visit: Payer: Self-pay | Admitting: Pharmacist

## 2017-06-13 NOTE — Patient Outreach (Addendum)
[facility]) Care Management  [place]   06/13/2017  Stacy Dalton [birth year] [id number]  Subjective: Patient was called to follow up and for pharmacy case closure. HIPAA identifiers were obtained.  Patient is a 66 y.o.femalewith a PMH of chronic back pain, anxiety, hypertension, dyslipidemia, hypothyroidism and history of migraines who was admitted 02/22/17 for evaluation of altered mental status secondary to polypharmacy.   Patient reported managing her medications on her own without difficulty.  Objective:   Encounter Medications: Outpatient Encounter Medications as of 06/13/2017  Medication Sig Note  . albuterol (PROVENTIL HFA;VENTOLIN HFA) 108 (90 Base) MCG/ACT inhaler Inhale 2 puffs into the lungs every 6 (six) hours as needed for wheezing or shortness of breath.   . Aspirin-Salicylamide-Caffeine (BC HEADACHE POWDER PO) Take by mouth. Use 1-2 packs daily PRN headache   . AZOR 10-40 MG tablet TAKE 1 TABLET DAILY   . docusate sodium (COLACE) 100 MG capsule Take 1 capsule (100 mg total) by mouth 2 (two) times daily as needed for mild constipation. 03/22/2017: ONLY TAKING PRN  . DULoxetine (CYMBALTA) 60 MG capsule Take 1 capsule (60 mg total) by mouth daily.   . ferrous sulfate 325 (65 FE) MG tablet Take 325 mg by mouth daily with breakfast.   . Fluticasone-Salmeterol (ADVAIR) 100-50 MCG/DOSE AEPB Inhale 1 puff 2 (two) times daily into the lungs.   . Ipratropium-Albuterol (COMBIVENT RESPIMAT) 20-100 MCG/ACT AERS respimat Inhale 1 puff into the lungs every 6 (six) hours as needed for wheezing. 03/22/2017: PRN  . levothyroxine (SYNTHROID, LEVOTHROID) 50 MCG tablet TAKE 1 TABLET DAILY   . ondansetron (ZOFRAN-ODT) 4 MG disintegrating tablet Take 1 tablet (4 mg total) by mouth every 8 (eight) hours as needed.   . temazepam (RESTORIL) 30 MG capsule Take 1 capsule (30 mg total) by mouth at bedtime as needed for sleep.   [name] tiotropium (SPIRIVA HANDIHALER) 18 MCG  inhalation capsule Place 1 capsule (18 mcg total) into inhaler and inhale daily.   . traMADol (ULTRAM) 50 MG tablet Take 1-2 tablets (50-100 mg total) by mouth every 12 (twelve) hours as needed (pain).   . triamterene-hydrochlorothiazide (DYAZIDE) 37.5-25 MG capsule TAKE 1 CAPSULE DAILY (Patient taking differently: TAKE 1 CAPSULE BY MOUTH DAILY)   . Vitamin D, Ergocalciferol, (DRISDOL) 50000 units CAPS capsule TAKE 1 CAPSULE EVERY 7 DAYS (Patient taking differently: TAKE 1 CAPSULE (50000 UNITS) BY MOUTH EVERY 7 DAYS)   . atorvastatin (LIPITOR) 40 MG tablet TAKE ONE-HALF (1/2) TABLET DAILY (CHANGE IN DIRECTIONS) (Patient not taking: Reported on 06/13/2017) 05/15/2017: Patient states she will speak with her physician.  . [DISCONTINUED] benzonatate (TESSALON) 200 MG capsule Take 1 capsule (200 mg total) by mouth 3 (three) times daily as needed. (Patient not taking: Reported on 06/13/2017)   . [DISCONTINUED] cefdinir (OMNICEF) 300 MG capsule Take 1 capsule (300 mg total) by mouth 2 (two) times daily. 1 po BID (Patient not taking: Reported on 06/13/2017)   . [DISCONTINUED] DULoxetine (CYMBALTA) 30 MG capsule TAKE 1 CAPSULE DAILY (Patient not taking: Reported on 06/13/2017)   . [DISCONTINUED] guaiFENesin (MUCINEX) 600 MG 12 hr tablet Take 1 tablet (600 mg total) by mouth 2 (two) times daily. (Patient not taking: Reported on 06/13/2017)   . [DISCONTINUED] HYDROcodone-homatropine (HYCODAN) 5-1.5 MG/5ML syrup Take 5 mLs by mouth every 8 (eight) hours as needed for cough. (Patient not taking: Reported on 06/13/2017)   . [DISCONTINUED] predniSONE (STERAPRED UNI-PAK 21 TAB) 10 MG (21) TBPK tablet As directed x 6 days (Patient  not taking: Reported on 06/13/2017)    No facility-administered encounter medications on file as of 06/13/2017.     Functional Status: In your present state of health, do you have any difficulty performing the following activities: 03/08/2017 02/28/2017  Hearing? N N  Vision? N N   Difficulty concentrating or making decisions? N N  Comment - -  Walking or climbing stairs? N N  Dressing or bathing? N N  Doing errands, shopping? N N  Preparing Food and eating ? N -  Using the Toilet? N -  In the past six months, have you accidently leaked urine? Y -  Comment only when i hold my urine for a long time. -  Do you have problems with loss of bowel control? Y -  Comment "sometimes" -  Managing your Medications? N -  Managing your Finances? N -  Housekeeping or managing your Housekeeping? N -  Some recent data might be hidden    Fall/Depression Screening: Fall Risk  05/30/2017 05/15/2017 05/03/2017  Falls in the past year? Yes Yes Yes  Number falls in past yr: 1 1 1   Injury with Fall? No No No  Comment - - -  Risk for fall due to : - - -  Follow up - - -   PHQ 2/9 Scores 05/30/2017 05/15/2017 05/03/2017 03/28/2017 03/22/2017 03/08/2017 02/28/2017  PHQ - 2 Score 2 2 2 2 1 1  0  PHQ- 9 Score 7 7 6 7  - - -      Assessment: Patient's medications were reviewed via telephone.   Drugs sorted by system:  Neurologic/Psychologic: Duloxetine Temazepam  Cardiovascular: Atorvastatin (does not take) Azor  Triamterene-HCTZ  Pulmonary/Allergy: Combivent Respimat Albuterol HFA Advair (only takes PRN) Spiriva  Gastrointestinal: Docusate Ondansetron ODT  Endocrine: Levothyroxine   Pain: BC Headache Powder Tramadol  Vitamins/Minerals: Ferrous Sulfate Vitamin D 50,000    Medication Review Findings:  Adherence Atorvastatin-patient stopped taking this on her own due to her increased back pain and because she read the side effects on the Internet (liver damage and muscle aches)  Advair-patient reported only using Advair PRN because she did not "feel" like it was working. She was counseled on the importance of taking Advair daily.     Potential for CNS depression: Combination of tramadol, duloxetine, temazepam increases the patient's risk of  drowsiness, dizziness, mental status changes, and increased fall potential  Plan: Route note to patient's PCP Coordinate with patient's Health Coach on adherence to COPD therapy. Close pharmacy case. Patient has my phone number and understands she can call back with medication questions or concerns in the future.   Elayne Guerin, PharmD, [place] Clinical Pharmacist [phone]

## 2017-06-14 ENCOUNTER — Other Ambulatory Visit: Payer: Self-pay

## 2017-06-14 NOTE — Patient Outreach (Signed)
[facility]) Care Management  06/14/2017  EMREY THORNLEY 23-Jul-1950 [id number]   Telephone call to patient for monthly outreach.  No answer. HIPAA compliant voice message left with contact information.  Plan:  RN Health Coach will make an outreach attempt to the patient in the month of January.  Lazaro Arms RN, BSN, [place] Direct Dial:  [phone] Fax: [phone]

## 2017-06-15 ENCOUNTER — Ambulatory Visit: Payer: Self-pay

## 2017-06-22 ENCOUNTER — Ambulatory Visit: Payer: Medicare Other | Admitting: Family

## 2017-06-27 ENCOUNTER — Encounter: Payer: Self-pay | Admitting: Physician Assistant

## 2017-06-27 ENCOUNTER — Ambulatory Visit (INDEPENDENT_AMBULATORY_CARE_PROVIDER_SITE_OTHER): Payer: Medicare Other | Admitting: Physician Assistant

## 2017-06-27 VITALS — BP 147/80 | HR 96 | Temp 97.4°F | Ht 61.0 in | Wt 121.6 lb

## 2017-06-27 DIAGNOSIS — J209 Acute bronchitis, unspecified: Secondary | ICD-10-CM | POA: Diagnosis not present

## 2017-06-27 DIAGNOSIS — J440 Chronic obstructive pulmonary disease with acute lower respiratory infection: Secondary | ICD-10-CM | POA: Diagnosis not present

## 2017-06-27 MED ORDER — HYDROCODONE-HOMATROPINE 5-1.5 MG/5ML PO SYRP: 5.0000 mL | ORAL_SOLUTION | Freq: Four times a day (QID) | ORAL | 0 refills | Status: DC | PRN

## 2017-06-27 MED ORDER — AZITHROMYCIN 250 MG PO TABS: ORAL_TABLET | ORAL | 0 refills | Status: DC

## 2017-06-27 MED ORDER — METHYLPREDNISOLONE ACETATE 80 MG/ML IJ SUSP
80.0000 mg | Freq: Once | INTRAMUSCULAR | Status: AC
  Administered 2017-06-27: 80 mg via INTRAMUSCULAR

## 2017-06-27 MED ORDER — FLUTICASONE-SALMETEROL 500-50 MCG/DOSE IN AEPB: 1.0000 | INHALATION_SPRAY | Freq: Two times a day (BID) | RESPIRATORY_TRACT | 5 refills | Status: DC

## 2017-06-27 NOTE — Progress Notes (Signed)
BP (!) 147/80   Pulse 96   Temp (!) 97.4 F (36.3 C)   Ht 5\' 1"  (1.549 m)   Wt 121 lb 9.6 oz (55.2 kg)   BMI 22.98 kg/m    Subjective:    Patient ID: Stacy Dalton, female    DOB: 11-05-1950, 66 y.o.   MRN: [id number]  HPI: Stacy Dalton is a 66 y.o. female presenting on 06/27/2017 for Cough  Known COPD patient with recurrent exacerbations. Patient with several days of progressing upper respiratory and bronchial symptoms. Initially there was more upper respiratory congestion. This progressed to having significant cough that is productive throughout the day and severe at night. There is occasional wheezing after coughing. Sometimes there is slight dyspnea on exertion. It is not productive mucus. Denies any blood.  Relevant past medical, surgical, family and social history reviewed and updated as indicated. Allergies and medications reviewed and updated.  Past Medical History:  Diagnosis Date  . COPD (chronic obstructive pulmonary disease) ([place])    told has copd, no current inhaler use  . Depression   . GERD (gastroesophageal reflux disease)   . Hypertension   . Hypothyroidism   . Insomnia   . Migraine   . Migraine   . Osteopenia   . Panic attacks     Past Surgical History:  Procedure Laterality Date  . ABDOMINAL HYSTERECTOMY    . COLON SURGERY    . COLOSTOMY CLOSURE  04/19/2012   Procedure: COLOSTOMY CLOSURE;  Surgeon: Adin Hector, MD;  Location: [facility];  Service: General;  Laterality: N/A;  Laparotomy, Resection and Closure of Colostomy  . COLOSTOMY TAKEDOWN N/A 04/12/2016   Procedure: [place] TAKEDOWN;  Surgeon: Johnathan Hausen, MD;  Location: [facility];  Service: General;  Laterality: N/A;  . INCONTINENCE SURGERY    . LAPAROTOMY  10/04/2011, colostomy also   Procedure: EXPLORATORY LAPAROTOMY;  Surgeon: Adin Hector, MD;  Location: [facility];  Service: General;  Laterality: N/A;  left partial colectomy with colostomy  . LAPAROTOMY  04/19/2012   Procedure:  EXPLORATORY LAPAROTOMY;  Surgeon: Adin Hector, MD;  Location: [facility];  Service: General;  Laterality: N/A;  . LAPAROTOMY N/A 11/29/2015   Procedure: EXPLORATORY LAPAROTOMY; SUBTOTAL COLECTOMY WITH [place] PROCEDURE AND END COLOSTOMY;  Surgeon: Johnathan Hausen, MD;  Location: [facility];  Service: General;  Laterality: N/A;  . TUBAL LIGATION    . VENTRAL HERNIA REPAIR  04/19/2012   Procedure: HERNIA REPAIR VENTRAL ADULT;  Surgeon: Adin Hector, MD;  Location: [facility];  Service: General;  Laterality: N/A;    Review of Systems  Constitutional: Positive for chills and fatigue. Negative for activity change, appetite change and fever.  HENT: Positive for congestion, postnasal drip and sore throat.   Eyes: Negative.   Respiratory: Positive for cough, shortness of breath and wheezing.   Cardiovascular: Negative.  Negative for chest pain, palpitations and leg swelling.  Gastrointestinal: Negative.   Genitourinary: Negative.   Musculoskeletal: Negative.   Skin: Negative.   Neurological: Positive for headaches.    Allergies as of 06/27/2017      Reactions   Toradol [ketorolac Tromethamine] Shortness Of Breath   Demerol Swelling   Esgic [butalbital-apap-caffeine] Other (See Comments)   jittery   Iodine Other (See Comments)   bp bottomed out per pt several hours later; unsure if pre medicated in past with cm; done in [place]      Medication List        Accurate as  of 06/27/17  3:02 PM. Always use your most recent med list.          atorvastatin 40 MG tablet Commonly known as:  LIPITOR TAKE ONE-HALF (1/2) TABLET DAILY (CHANGE IN DIRECTIONS)   azithromycin 250 MG tablet Commonly known as:  ZITHROMAX Z-PAK Take as directed   AZOR 10-40 MG tablet Generic drug:  amLODipine-olmesartan TAKE 1 TABLET DAILY   BC HEADACHE POWDER PO Take by mouth. Use 1-2 packs daily PRN headache   docusate sodium 100 MG capsule Commonly known as:  COLACE Take 1 capsule (100 mg total) by mouth 2 (two)  times daily as needed for mild constipation.   DULoxetine 60 MG capsule Commonly known as:  CYMBALTA Take 1 capsule (60 mg total) by mouth daily.   ferrous sulfate 325 (65 FE) MG tablet Take 325 mg by mouth daily with breakfast.   Fluticasone-Salmeterol 500-50 MCG/DOSE Aepb Commonly known as:  ADVAIR DISKUS Inhale 1 puff into the lungs 2 (two) times daily.   HYDROcodone-homatropine 5-1.5 MG/5ML syrup Commonly known as:  HYCODAN Take 5 mLs by mouth every 6 (six) hours as needed.   levothyroxine 50 MCG tablet Commonly known as:  SYNTHROID, LEVOTHROID TAKE 1 TABLET DAILY   ondansetron 4 MG disintegrating tablet Commonly known as:  ZOFRAN-ODT Take 1 tablet (4 mg total) by mouth every 8 (eight) hours as needed.   temazepam 30 MG capsule Commonly known as:  RESTORIL Take 1 capsule (30 mg total) by mouth at bedtime as needed for sleep.   tiotropium 18 MCG inhalation capsule Commonly known as:  SPIRIVA HANDIHALER Place 1 capsule (18 mcg total) into inhaler and inhale daily.   traMADol 50 MG tablet Commonly known as:  ULTRAM Take 1-2 tablets (50-100 mg total) by mouth every 12 (twelve) hours as needed (pain).   triamterene-hydrochlorothiazide 37.5-25 MG capsule Commonly known as:  DYAZIDE TAKE 1 CAPSULE DAILY   VENTOLIN HFA 108 (90 Base) MCG/ACT inhaler Generic drug:  albuterol Inhale into the lungs every 6 (six) hours as needed for wheezing or shortness of breath.   Vitamin D (Ergocalciferol) 50000 units Caps capsule Commonly known as:  DRISDOL TAKE 1 CAPSULE EVERY 7 DAYS          Objective:    BP (!) 147/80   Pulse 96   Temp (!) 97.4 F (36.3 C)   Ht 5\' 1"  (1.549 m)   Wt 121 lb 9.6 oz (55.2 kg)   BMI 22.98 kg/m   Allergies  Allergen Reactions  . Toradol [Ketorolac Tromethamine] Shortness Of Breath  . Demerol Swelling  . Esgic [Butalbital-Apap-Caffeine] Other (See Comments)    jittery  . Iodine Other (See Comments)    bp bottomed out per pt several hours  later; unsure if pre medicated in past with cm; done in [place]    Physical Exam  Constitutional: She is oriented to person, place, and time. She appears well-developed and well-nourished.  HENT:  Head: Normocephalic and atraumatic.  Right Ear: No drainage or tenderness.  Left Ear: No drainage or tenderness.  Nose: Mucosal edema and rhinorrhea present. Right sinus exhibits no maxillary sinus tenderness and no frontal sinus tenderness. Left sinus exhibits no maxillary sinus tenderness and no frontal sinus tenderness.  Mouth/Throat: Posterior oropharyngeal erythema present. No oropharyngeal exudate.  Eyes: Conjunctivae and EOM are normal. Pupils are equal, round, and reactive to light.  Neck: Normal range of motion. Neck supple.  Cardiovascular: Normal rate, regular rhythm, normal heart sounds and intact distal pulses.  Pulmonary/Chest: Effort normal. She has no decreased breath sounds. She has wheezes in the right upper field, the right middle field, the right lower field, the left upper field, the left middle field and the left lower field.  Abdominal: Soft. Bowel sounds are normal.  Neurological: She is alert and oriented to person, place, and time. She has normal reflexes.  Skin: Skin is warm and dry. No rash noted.  Psychiatric: She has a normal mood and affect. Her behavior is normal. Judgment and thought content normal.        Assessment & Plan:   1. Acute bronchitis with COPD ([place]) - methylPREDNISolone acetate (DEPO-MEDROL) injection 80 mg - HYDROcodone-homatropine (HYCODAN) 5-1.5 MG/5ML syrup; Take 5 mLs by mouth every 6 (six) hours as needed.  Dispense: 240 mL; Refill: 0 - azithromycin (ZITHROMAX Z-PAK) 250 MG tablet; Take as directed  Dispense: 6 each; Refill: 0     Current Outpatient Medications:  .  albuterol (VENTOLIN HFA) 108 (90 Base) MCG/ACT inhaler, Inhale into the lungs every 6 (six) hours as needed for wheezing or shortness of breath., Disp: , Rfl:  .   Aspirin-Salicylamide-Caffeine (BC HEADACHE POWDER PO), Take by mouth. Use 1-2 packs daily PRN headache, Disp: , Rfl:  .  atorvastatin (LIPITOR) 40 MG tablet, TAKE ONE-HALF (1/2) TABLET DAILY (CHANGE IN DIRECTIONS), Disp: 90 tablet, Rfl: 1 .  AZOR 10-40 MG tablet, TAKE 1 TABLET DAILY, Disp: 90 tablet, Rfl: 1 .  docusate sodium (COLACE) 100 MG capsule, Take 1 capsule (100 mg total) by mouth 2 (two) times daily as needed for mild constipation., Disp: 10 capsule, Rfl: 0 .  DULoxetine (CYMBALTA) 60 MG capsule, Take 1 capsule (60 mg total) by mouth daily., Disp: 90 capsule, Rfl: 1 .  ferrous sulfate 325 (65 FE) MG tablet, Take 325 mg by mouth daily with breakfast., Disp: , Rfl:  .  levothyroxine (SYNTHROID, LEVOTHROID) 50 MCG tablet, TAKE 1 TABLET DAILY, Disp: 90 tablet, Rfl: 0 .  ondansetron (ZOFRAN-ODT) 4 MG disintegrating tablet, Take 1 tablet (4 mg total) by mouth every 8 (eight) hours as needed., Disp: 30 tablet, Rfl: 2 .  temazepam (RESTORIL) 30 MG capsule, Take 1 capsule (30 mg total) by mouth at bedtime as needed for sleep., Disp: 30 capsule, Rfl: 4 .  tiotropium (SPIRIVA HANDIHALER) 18 MCG inhalation capsule, Place 1 capsule (18 mcg total) into inhaler and inhale daily., Disp: 30 capsule, Rfl: 12 .  traMADol (ULTRAM) 50 MG tablet, Take 1-2 tablets (50-100 mg total) by mouth every 12 (twelve) hours as needed (pain)., Disp: 120 tablet, Rfl: 2 .  triamterene-hydrochlorothiazide (DYAZIDE) 37.5-25 MG capsule, TAKE 1 CAPSULE DAILY (Patient taking differently: TAKE 1 CAPSULE BY MOUTH DAILY), Disp: 90 capsule, Rfl: 1 .  Vitamin D, Ergocalciferol, (DRISDOL) 50000 units CAPS capsule, TAKE 1 CAPSULE EVERY 7 DAYS (Patient taking differently: TAKE 1 CAPSULE (50000 UNITS) BY MOUTH EVERY 7 DAYS), Disp: 12 capsule, Rfl: 3 .  azithromycin (ZITHROMAX Z-PAK) 250 MG tablet, Take as directed, Disp: 6 each, Rfl: 0 .  Fluticasone-Salmeterol (ADVAIR DISKUS) 500-50 MCG/DOSE AEPB, Inhale 1 puff into the lungs 2 (two) times  daily., Disp: 60 each, Rfl: 5 .  HYDROcodone-homatropine (HYCODAN) 5-1.5 MG/5ML syrup, Take 5 mLs by mouth every 6 (six) hours as needed., Disp: 240 mL, Rfl: 0  Current Facility-Administered Medications:  .  methylPREDNISolone acetate (DEPO-MEDROL) injection 80 mg, 80 mg, Intramuscular, Once, Mahkayla Preece S, PA-C Continue all other maintenance medications as listed above.  Follow up plan: Return if symptoms worsen  or fail to improve.  Educational handout given for [place] PA-C [facility] [address]  [place], [place] [address] [phone]   06/27/2017, 3:02 PM

## 2017-06-27 NOTE — Patient Instructions (Signed)
In a few days you may receive a survey in the mail or online from [employer] regarding your visit with us today. Please take a moment to fill this out. Your feedback is very important to our whole office. It can help us better understand your needs as well as improve your experience and satisfaction. Thank you for taking your time to complete it. We care about you.  Aaira Oestreicher, PA-C  

## 2017-06-29 ENCOUNTER — Ambulatory Visit (INDEPENDENT_AMBULATORY_CARE_PROVIDER_SITE_OTHER): Payer: Medicare Other | Admitting: Family

## 2017-06-29 ENCOUNTER — Encounter: Payer: Self-pay | Admitting: Family

## 2017-06-29 ENCOUNTER — Ambulatory Visit: Payer: Medicare Other | Admitting: Family

## 2017-06-29 VITALS — BP 133/69 | HR 72 | Temp 98.4°F | Ht 60.0 in | Wt 124.0 lb

## 2017-06-29 DIAGNOSIS — K5909 Other constipation: Secondary | ICD-10-CM

## 2017-06-29 DIAGNOSIS — E785 Hyperlipidemia, unspecified: Secondary | ICD-10-CM

## 2017-06-29 DIAGNOSIS — I10 Essential (primary) hypertension: Secondary | ICD-10-CM

## 2017-06-29 DIAGNOSIS — F331 Major depressive disorder, recurrent, moderate: Secondary | ICD-10-CM

## 2017-06-29 DIAGNOSIS — F419 Anxiety disorder, unspecified: Secondary | ICD-10-CM | POA: Diagnosis not present

## 2017-06-29 DIAGNOSIS — F1120 Opioid dependence, uncomplicated: Secondary | ICD-10-CM | POA: Diagnosis not present

## 2017-06-29 DIAGNOSIS — E039 Hypothyroidism, unspecified: Secondary | ICD-10-CM | POA: Diagnosis not present

## 2017-06-29 DIAGNOSIS — G8929 Other chronic pain: Secondary | ICD-10-CM

## 2017-06-29 DIAGNOSIS — Z0289 Encounter for other administrative examinations: Secondary | ICD-10-CM

## 2017-06-29 DIAGNOSIS — G894 Chronic pain syndrome: Secondary | ICD-10-CM

## 2017-06-29 DIAGNOSIS — K219 Gastro-esophageal reflux disease without esophagitis: Secondary | ICD-10-CM

## 2017-06-29 DIAGNOSIS — D649 Anemia, unspecified: Secondary | ICD-10-CM

## 2017-06-29 DIAGNOSIS — M546 Pain in thoracic spine: Secondary | ICD-10-CM

## 2017-06-29 DIAGNOSIS — J449 Chronic obstructive pulmonary disease, unspecified: Secondary | ICD-10-CM

## 2017-06-29 DIAGNOSIS — R6889 Other general symptoms and signs: Secondary | ICD-10-CM | POA: Diagnosis not present

## 2017-06-29 MED ORDER — TRAMADOL HCL 50 MG PO TABS: 50.0000 mg | ORAL_TABLET | Freq: Two times a day (BID) | ORAL | 2 refills | Status: DC | PRN

## 2017-06-29 NOTE — Progress Notes (Signed)
Subjective:    Patient ID: Stacy Dalton, female    DOB: Jan 11, 1951, 66 y.o.   MRN: [id number]  Pt presents to the office today for chronic follow up.  Hypertension  This is a chronic problem. The current episode started more than 1 year ago. The problem has been resolved since onset. The problem is controlled. Associated symptoms include anxiety, malaise/fatigue and shortness of breath. Pertinent negatives include no peripheral edema. Risk factors for coronary artery disease include dyslipidemia and sedentary lifestyle. The current treatment provides moderate improvement. There is no history of kidney disease, CAD/MI, CVA or heart failure. Identifiable causes of hypertension include a thyroid problem.  Hyperlipidemia  This is a chronic problem. The current episode started more than 1 year ago. The problem is controlled. Recent lipid tests were reviewed and are normal. Associated symptoms include shortness of breath. Current antihyperlipidemic treatment includes statins. The current treatment provides moderate improvement of lipids.  Anxiety  Presents for follow-up visit. Symptoms include decreased concentration, depressed mood, excessive worry, insomnia, irritability, nervous/anxious behavior and shortness of breath. Symptoms occur most days. The severity of symptoms is moderate.   Her past medical history is significant for anemia.  Depression         This is a chronic problem.  The current episode started more than 1 year ago.   The onset quality is gradual.   The problem occurs constantly.  Associated symptoms include decreased concentration, fatigue, insomnia, irritable and sad.  Past treatments include SSRIs - Selective serotonin reuptake inhibitors.  Compliance with treatment is good.  Past medical history includes thyroid problem and anxiety.   Constipation  This is a chronic problem. The current episode started more than 1 year ago. The problem has been resolved since onset. Her stool  frequency is 1 time per day. Associated symptoms include back pain. The treatment provided moderate relief.  Thyroid Problem  Presents for follow-up visit. Symptoms include anxiety, constipation, depressed mood, dry skin and fatigue. The symptoms have been stable. Her past medical history is significant for hyperlipidemia. There is no history of heart failure.  Anemia  Presents for follow-up visit. Symptoms include malaise/fatigue. There is no history of heart failure.  Back Pain  This is a chronic problem. The current episode started more than 1 year ago. The problem occurs intermittently. The problem has been waxing and waning since onset. The pain is present in the lumbar spine and thoracic spine. The pain is at a severity of 6/10. The pain is moderate.  COPD PT currently taking Advair and Spiriva that helps, but continues to have SOB at times.     Review of Systems  Constitutional: Positive for fatigue, irritability and malaise/fatigue.  Respiratory: Positive for shortness of breath.   Gastrointestinal: Positive for constipation.  Musculoskeletal: Positive for back pain.  Psychiatric/Behavioral: Positive for decreased concentration and depression. The patient is nervous/anxious and has insomnia.   All other systems reviewed and are negative.      Objective:   Physical Exam  Constitutional: She is oriented to person, place, and time. She appears well-developed and well-nourished. She is irritable. No distress.  HENT:  Head: Normocephalic and atraumatic.  Right Ear: External ear normal.  Left Ear: External ear normal.  Nose: Nose normal.  Mouth/Throat: Oropharynx is clear and moist.  Eyes: Pupils are equal, round, and reactive to light.  Neck: Normal range of motion. Neck supple. No thyromegaly present.  Cardiovascular: Normal rate, regular rhythm, normal heart sounds and intact distal pulses.  No murmur heard. Pulmonary/Chest: Effort normal and breath sounds normal. No  respiratory distress. She has no wheezes.  Abdominal: Soft. Bowel sounds are normal. She exhibits no distension. There is no tenderness.  Musculoskeletal: Normal range of motion. She exhibits no edema or tenderness.  Pain in upper back with flexion   Neurological: She is alert and oriented to person, place, and time.  Skin: Skin is warm and dry.  Psychiatric: She has a normal mood and affect. Her behavior is normal. Judgment and thought content normal.  Vitals reviewed.     BP 133/69   Pulse 72   Temp 98.4 F (36.9 C) (Oral)   Ht 5' (1.524 m)   Wt 124 lb (56.2 kg)   BMI 24.22 kg/m      Assessment & Plan:  1. Essential hypertension - CMP14+EGFR  2. Chronic constipation - CMP14+EGFR  3. Gastroesophageal reflux disease without esophagitis - CMP14+EGFR  4. Hypothyroidism, unspecified type  - CMP14+EGFR - TSH  5. Chronic bilateral thoracic back pain - CMP14+EGFR - traMADol (ULTRAM) 50 MG tablet; Take 1-2 tablets (50-100 mg total) by mouth every 12 (twelve) hours as needed (pain).  Dispense: 120 tablet; Refill: 2  6. Anxiety - CMP14+EGFR  7. Other chronic pain  - CMP14+EGFR  8. Uncomplicated opioid dependence (HCC) - CMP14+EGFR - traMADol (ULTRAM) 50 MG tablet; Take 1-2 tablets (50-100 mg total) by mouth every 12 (twelve) hours as needed (pain).  Dispense: 120 tablet; Refill: 2  9. Hyperlipidemia, unspecified hyperlipidemia type  - CMP14+EGFR - Lipid panel  10. Chronic pain syndrome - CMP14+EGFR - traMADol (ULTRAM) 50 MG tablet; Take 1-2 tablets (50-100 mg total) by mouth every 12 (twelve) hours as needed (pain).  Dispense: 120 tablet; Refill: 2  11. Moderate episode of recurrent major depressive disorder (HCC)  - CMP14+EGFR  12. Normocytic anemia - CMP14+EGFR - Anemia Profile B  13. Chronic obstructive pulmonary disease, unspecified COPD type ([place]) - CMP14+EGFR  14. Pain medication agreement signed  - traMADol (ULTRAM) 50 MG tablet; Take 1-2  tablets (50-100 mg total) by mouth every 12 (twelve) hours as needed (pain).  Dispense: 120 tablet; Refill: 2   Continue all meds Labs pending Health Maintenance reviewed Diet and exercise encouraged RTO 6 months   Evelina Dun, FNP

## 2017-06-29 NOTE — Patient Instructions (Signed)

## 2017-06-30 LAB — ANEMIA PROFILE B
Basophils Absolute: 0 10*3/uL (ref 0.0–0.2)
Basos: 0 %
EOS (ABSOLUTE): 0.3 10*3/uL (ref 0.0–0.4)
Eos: 4 %
Ferritin: 14 ng/mL — ABNORMAL LOW (ref 15–150)
Folate: >20 ng/mL (ref >3.0)
Hematocrit: 39.9 % (ref 34.0–46.6)
Hemoglobin: 13.2 g/dL (ref 11.1–15.9)
Immature Grans (Abs): 0 10*3/uL (ref 0.0–0.1)
Immature Granulocytes: 1 %
Iron Saturation: 18 % (ref 15–55)
Iron: 69 ug/dL (ref 27–139)
Lymphocytes Absolute: 2.6 10*3/uL (ref 0.7–3.1)
Lymphs: 36 %
MCH: 29.3 pg (ref 26.6–33.0)
MCHC: 33.1 g/dL (ref 31.5–35.7)
MCV: 89 fL (ref 79–97)
Monocytes Absolute: 0.5 10*3/uL (ref 0.1–0.9)
Monocytes: 7 %
Neutrophils Absolute: 3.9 10*3/uL (ref 1.4–7.0)
Neutrophils: 52 %
Platelets: 360 10*3/uL (ref 150–379)
RBC: 4.51 x10E6/uL (ref 3.77–5.28)
RDW: 14.4 % (ref 12.3–15.4)
Retic Ct Pct: 0.9 % (ref 0.6–2.6)
Total Iron Binding Capacity: 385 ug/dL (ref 250–450)
UIBC: 316 ug/dL (ref 118–369)
Vitamin B-12: 524 pg/mL (ref 232–1245)
WBC: 7.3 10*3/uL (ref 3.4–10.8)

## 2017-06-30 LAB — CMP14+EGFR
ALT: 13 IU/L (ref 0–32)
AST: 21 IU/L (ref 0–40)
Albumin/Globulin Ratio: 2 (ref 1.2–2.2)
Albumin: 4.7 g/dL (ref 3.6–4.8)
Alkaline Phosphatase: 73 IU/L (ref 39–117)
BUN/Creatinine Ratio: 19 (ref 12–28)
BUN: 22 mg/dL (ref 8–27)
Bilirubin Total: <0.2 mg/dL (ref 0.0–1.2)
CO2: 26 mmol/L (ref 20–29)
Calcium: 9.7 mg/dL (ref 8.7–10.3)
Chloride: 97 mmol/L (ref 96–106)
Creatinine, Ser: 1.14 mg/dL — ABNORMAL HIGH (ref 0.57–1.00)
GFR calc Af Amer: 57 mL/min/{1.73_m2} — ABNORMAL LOW (ref >59)
GFR calc non Af Amer: 50 mL/min/{1.73_m2} — ABNORMAL LOW (ref >59)
Globulin, Total: 2.3 g/dL (ref 1.5–4.5)
Glucose: 96 mg/dL (ref 65–99)
Potassium: 4.1 mmol/L (ref 3.5–5.2)
Sodium: 139 mmol/L (ref 134–144)
Total Protein: 7 g/dL (ref 6.0–8.5)

## 2017-06-30 LAB — LIPID PANEL
Chol/HDL Ratio: 3.2 ratio (ref 0.0–4.4)
Cholesterol, Total: 230 mg/dL — ABNORMAL HIGH (ref 100–199)
HDL: 73 mg/dL (ref >39)
LDL Calculated: 131 mg/dL — ABNORMAL HIGH (ref 0–99)
Triglycerides: 130 mg/dL (ref 0–149)
VLDL Cholesterol Cal: 26 mg/dL (ref 5–40)

## 2017-06-30 LAB — TSH: TSH: 2.81 u[IU]/mL (ref 0.450–4.500)

## 2017-07-05 ENCOUNTER — Other Ambulatory Visit: Payer: Self-pay

## 2017-07-05 NOTE — Patient Outreach (Signed)
[facility]) Care Management  [place]  07/05/2017   SANTINA TRILLO [birth year] [id number]  Subjective: Telephone called placed to the patient for a monthly assessment.  HIPAA verified. The patient states that she has been having some shortness of breath. She denies any fever or coughing up mucous. She has seen her primary physician and given medications for her exacerbation. [place] discussed the importance of knowing her copd action plan.   The patient verbalized understanding.The patient stated that she is having more stress than usual and feels this may have started her exacerbation. She is still trying to perform some of the methods in the educational material that was sent to her. She states that she is adherent with all of her medications. She is trying to exercise more around the home. RN Health Coach discussed with the patient about avoiding sick people and making sure that she washes her hands.  The patient states that she thinks she has an appointment with her physician in three months.  RN Health Coach asked the patient to check, if she does not have an appointment set  call and schedule one. Patient verbalized understanding  Objective:   Encounter Medications:  Outpatient Encounter Medications as of 07/05/2017  Medication Sig Note  . albuterol (VENTOLIN HFA) 108 (90 Base) MCG/ACT inhaler Inhale into the lungs every 6 (six) hours as needed for wheezing or shortness of breath.   . Aspirin-Salicylamide-Caffeine (BC HEADACHE POWDER PO) Take by mouth. Use 1-2 packs daily PRN headache   . AZOR 10-40 MG tablet TAKE 1 TABLET DAILY   . docusate sodium (COLACE) 100 MG capsule Take 1 capsule (100 mg total) by mouth 2 (two) times daily as needed for mild constipation. 03/22/2017: ONLY TAKING PRN  . DULoxetine (CYMBALTA) 60 MG capsule Take 1 capsule (60 mg total) by mouth daily.   . ferrous sulfate 325 (65 FE) MG tablet Take 325 mg by mouth daily with breakfast.    . Fluticasone-Salmeterol (ADVAIR DISKUS) 500-50 MCG/DOSE AEPB Inhale 1 puff into the lungs 2 (two) times daily.   [name] HYDROcodone-homatropine (HYCODAN) 5-1.5 MG/5ML syrup Take 5 mLs by mouth every 6 (six) hours as needed.   [name] levothyroxine (SYNTHROID, LEVOTHROID) 50 MCG tablet TAKE 1 TABLET DAILY   . ondansetron (ZOFRAN-ODT) 4 MG disintegrating tablet Take 1 tablet (4 mg total) by mouth every 8 (eight) hours as needed.   . temazepam (RESTORIL) 30 MG capsule Take 1 capsule (30 mg total) by mouth at bedtime as needed for sleep.   [name] tiotropium (SPIRIVA HANDIHALER) 18 MCG inhalation capsule Place 1 capsule (18 mcg total) into inhaler and inhale daily.   . traMADol (ULTRAM) 50 MG tablet Take 1-2 tablets (50-100 mg total) by mouth every 12 (twelve) hours as needed (pain).   . triamterene-hydrochlorothiazide (DYAZIDE) 37.5-25 MG capsule TAKE 1 CAPSULE DAILY (Patient taking differently: TAKE 1 CAPSULE BY MOUTH DAILY)   . Vitamin D, Ergocalciferol, (DRISDOL) 50000 units CAPS capsule TAKE 1 CAPSULE EVERY 7 DAYS (Patient taking differently: TAKE 1 CAPSULE (50000 UNITS) BY MOUTH EVERY 7 DAYS)   . atorvastatin (LIPITOR) 40 MG tablet TAKE ONE-HALF (1/2) TABLET DAILY (CHANGE IN DIRECTIONS) (Patient not taking: Reported on 07/05/2017) 05/15/2017: Patient states she will speak with her physician.  [name] azithromycin (ZITHROMAX Z-PAK) 250 MG tablet Take as directed (Patient not taking: Reported on 07/05/2017)    No facility-administered encounter medications on file as of 07/05/2017.     Functional Status:  In your present state  of health, do you have any difficulty performing the following activities: 03/08/2017 02/28/2017  Hearing? N N  Vision? N N  Difficulty concentrating or making decisions? N N  Comment - -  Walking or climbing stairs? N N  Dressing or bathing? N N  Doing errands, shopping? N N  Preparing Food and eating ? N -  Using the Toilet? N -  In the past six months, have you accidently leaked urine? Y -   Comment only when i hold my urine for a long time. -  Do you have problems with loss of bowel control? Y -  Comment "sometimes" -  Managing your Medications? N -  Managing your Finances? N -  Housekeeping or managing your Housekeeping? N -  Some recent data might be hidden    Fall/Depression Screening: Fall Risk  07/05/2017 06/29/2017 06/27/2017  Falls in the past year? No No No  Number falls in past yr: - 1 -  Injury with Fall? - No -  Comment - - -  Risk for fall due to : - - -  Follow up - - -   PHQ 2/9 Scores 06/29/2017 06/27/2017 05/30/2017 05/15/2017 05/03/2017 03/28/2017 03/22/2017  PHQ - 2 Score 2 2 2 2 2 2 1   PHQ- 9 Score 7 - 7 7 6 7  -    Assessment: Patient continues to benefit from health coach outreach for disease management and support.   THN CM Care Plan Problem One     Most Recent Value  Care Plan Problem One  Knowledge deficit related to COPD action plan  Role Documenting the Problem One  Health Coach  Care Plan for Problem One  Active  THN Long Term Goal   In 90 days the patient will be able to explain the COPD action plan  THN Long Term Goal Start Date  05/15/17  Interventions for Problem One [place] discussed the importance with the patient of knowing the copd action plan  THN CM Short Term Goal #1   In 30 days the patient will verbalize that she has received her flu shot  THN CM Short Term Goal #1 Start Date  05/15/17  Interventions for Short Term Goal #1  [place] talked with the patient about the benefits of the flu shot  THN CM Short Term Goal #2   In 30 days the patient will be able to verbalize one way to help relieve stress.  THN CM Short Term Goal #2 Start Date  05/15/17  [facility] CM Short Term Goal #2 Met Date  04/03/17  Interventions for Short Term Goal #2  [place] will send educational information for the patient to review     Plan: [place] will contact patient in the month of February and patient agrees to  next outreach.  Lazaro Arms RN, BSN, [place] Direct Dial:  [phone] Fax: [phone]

## 2017-07-13 ENCOUNTER — Other Ambulatory Visit: Payer: Self-pay | Admitting: Physician Assistant

## 2017-07-13 DIAGNOSIS — M546 Pain in thoracic spine: Principal | ICD-10-CM

## 2017-07-13 DIAGNOSIS — G8929 Other chronic pain: Secondary | ICD-10-CM

## 2017-07-25 ENCOUNTER — Other Ambulatory Visit: Payer: Self-pay | Admitting: Family

## 2017-07-30 ENCOUNTER — Ambulatory Visit (INDEPENDENT_AMBULATORY_CARE_PROVIDER_SITE_OTHER): Payer: Medicare Other | Admitting: Family

## 2017-07-30 ENCOUNTER — Encounter: Payer: Self-pay | Admitting: Family

## 2017-07-30 VITALS — BP 132/77 | HR 89 | Temp 98.5°F | Ht 60.0 in | Wt 121.0 lb

## 2017-07-30 DIAGNOSIS — J440 Chronic obstructive pulmonary disease with acute lower respiratory infection: Secondary | ICD-10-CM | POA: Diagnosis not present

## 2017-07-30 DIAGNOSIS — J441 Chronic obstructive pulmonary disease with (acute) exacerbation: Secondary | ICD-10-CM

## 2017-07-30 DIAGNOSIS — J209 Acute bronchitis, unspecified: Secondary | ICD-10-CM

## 2017-07-30 MED ORDER — CETIRIZINE HCL 10 MG PO TABS: 10.0000 mg | ORAL_TABLET | Freq: Every day | ORAL | 11 refills | Status: DC

## 2017-07-30 MED ORDER — HYDROCODONE-HOMATROPINE 5-1.5 MG/5ML PO SYRP: 5.0000 mL | ORAL_SOLUTION | Freq: Four times a day (QID) | ORAL | 0 refills | Status: DC | PRN

## 2017-07-30 MED ORDER — FLUTICASONE PROPIONATE 50 MCG/ACT NA SUSP: 2.0000 | Freq: Every day | NASAL | 6 refills | Status: DC

## 2017-07-30 NOTE — Progress Notes (Signed)
   Subjective:    Patient ID: Stacy Dalton, female    DOB: Nov 08, 1950, 67 y.o.   MRN: [id number]  Cough  This is a recurrent problem. The current episode started in the past 7 days. The problem has been waxing and waning. The problem occurs every few minutes. The cough is non-productive. Associated symptoms include a fever, headaches, myalgias and nasal congestion. Pertinent negatives include no chills, ear congestion, ear pain or sore throat. She has tried rest, prescription cough suppressant and OTC cough suppressant for the symptoms. The treatment provided mild relief. Her past medical history is significant for COPD.      Review of Systems  Constitutional: Positive for fever. Negative for chills.  HENT: Negative for ear pain and sore throat.   Respiratory: Positive for cough.   Musculoskeletal: Positive for myalgias.  Neurological: Positive for headaches.  All other systems reviewed and are negative.      Objective:   Physical Exam  Constitutional: She is oriented to person, place, and time. She appears well-developed and well-nourished. No distress.  HENT:  Head: Normocephalic and atraumatic.  Right Ear: External ear normal.  Left Ear: External ear normal.  Nose: Mucosal edema and rhinorrhea present.  Mouth/Throat: Posterior oropharyngeal erythema present.  Eyes: Pupils are equal, round, and reactive to light.  Neck: Normal range of motion. Neck supple. No thyromegaly present.  Cardiovascular: Normal rate, regular rhythm, normal heart sounds and intact distal pulses.  No murmur heard. Pulmonary/Chest: Effort normal and breath sounds normal. No respiratory distress. She has no wheezes.  Abdominal: Soft. Bowel sounds are normal. She exhibits no distension. There is no tenderness.  Musculoskeletal: Normal range of motion. She exhibits no edema or tenderness.  Neurological: She is alert and oriented to person, place, and time.  Skin: Skin is warm and dry.  Psychiatric: She  has a normal mood and affect. Her behavior is normal. Judgment and thought content normal.  Vitals reviewed.     BP 132/77   Pulse 89   Temp 98.5 F (36.9 C) (Oral)   Ht 5' (1.524 m)   Wt 121 lb (54.9 kg)   BMI 23.63 kg/m      Assessment & Plan:  1. Chronic obstructive pulmonary disease with acute exacerbation (HCC) - cetirizine (ZYRTEC) 10 MG tablet; Take 1 tablet (10 mg total) by mouth daily.  Dispense: 30 tablet; Refill: 11 - fluticasone (FLONASE) 50 MCG/ACT nasal spray; Place 2 sprays into both nostrils daily.  Dispense: 16 g; Refill: 6  2. Acute bronchitis with COPD ([place]) - Take meds as prescribed - Use a cool mist humidifier  -Force fluids -For any cough or congestion  Use plain Mucinex- regular strength or max strength is fine -For fever or aces or pains- take tylenol or ibuprofen appropriate for age and weight. -Throat lozenges if help - cetirizine (ZYRTEC) 10 MG tablet; Take 1 tablet (10 mg total) by mouth daily.  Dispense: 30 tablet; Refill: 11 - fluticasone (FLONASE) 50 MCG/ACT nasal spray; Place 2 sprays into both nostrils daily.  Dispense: 16 g; Refill: 6 - HYDROcodone-homatropine (HYCODAN) 5-1.5 MG/5ML syrup; Take 5 mLs by mouth every 6 (six) hours as needed.  Dispense: 240 mL; Refill: 0    Evelina Dun, FNP

## 2017-07-30 NOTE — Patient Instructions (Signed)

## 2017-08-06 ENCOUNTER — Other Ambulatory Visit: Payer: Self-pay

## 2017-08-06 NOTE — Patient Outreach (Signed)
[place] [facility]) Care Management  08/06/2017  ILSA BONELLO 10-19-1950 [id number]   1st telephone to the patient for monthly assessment. No answer. Unable to leave a message the voicemail has not been set up.  Plan:  RN Health Coach will make an outreach attempt to the patient within three business days.  Lazaro Arms RN, BSN, [place] Direct Dial:  [phone] Fax: [phone]

## 2017-08-07 ENCOUNTER — Other Ambulatory Visit: Payer: Self-pay | Admitting: Family

## 2017-08-07 DIAGNOSIS — G8929 Other chronic pain: Secondary | ICD-10-CM

## 2017-08-07 DIAGNOSIS — M546 Pain in thoracic spine: Principal | ICD-10-CM

## 2017-08-08 ENCOUNTER — Other Ambulatory Visit: Payer: Self-pay

## 2017-08-08 NOTE — Patient Outreach (Signed)
[place] [facility]) Care Management  08/08/2017  Stacy Dalton 10-Mar-1951 [id number]   2 nd telephone call to the patient for monthly assessment.  No answer.  HIPAA compliant message left with contact information.  Plan:  RN Health coach will make an outreach attempt to the patient in the month of February.  Lazaro Arms RN, BSN, [place] Direct Dial:  [phone] Fax: [phone]

## 2017-08-14 ENCOUNTER — Other Ambulatory Visit: Payer: Self-pay

## 2017-08-14 NOTE — Patient Outreach (Signed)
[place] [facility]) Care Management  08/14/2017  Stacy Dalton [birth year] [id number]   3rd telephone call to the patient for monthly assessment. No answer. HIPAA compliant message left with contact information.  Plan:  RN Health Coach will send letter to attempt outreach.  If no response within ten business days will proceed with case closure.     Lazaro Arms RN, BSN, [place] Direct Dial:  [phone] Fax: [phone]

## 2017-08-20 ENCOUNTER — Other Ambulatory Visit: Payer: Self-pay | Admitting: Family

## 2017-08-20 ENCOUNTER — Other Ambulatory Visit: Payer: Self-pay

## 2017-08-20 DIAGNOSIS — J209 Acute bronchitis, unspecified: Secondary | ICD-10-CM

## 2017-08-20 DIAGNOSIS — J440 Chronic obstructive pulmonary disease with acute lower respiratory infection: Principal | ICD-10-CM

## 2017-08-20 NOTE — Telephone Encounter (Signed)
Patient requesting a refill 

## 2017-08-23 ENCOUNTER — Encounter: Payer: Self-pay | Admitting: Family

## 2017-08-23 ENCOUNTER — Ambulatory Visit (INDEPENDENT_AMBULATORY_CARE_PROVIDER_SITE_OTHER): Payer: Medicare Other

## 2017-08-23 ENCOUNTER — Ambulatory Visit (INDEPENDENT_AMBULATORY_CARE_PROVIDER_SITE_OTHER): Payer: Medicare Other | Admitting: Family

## 2017-08-23 VITALS — BP 136/74 | HR 99 | Temp 97.9°F | Ht 60.0 in | Wt 124.0 lb

## 2017-08-23 DIAGNOSIS — R05 Cough: Secondary | ICD-10-CM

## 2017-08-23 DIAGNOSIS — R059 Cough, unspecified: Secondary | ICD-10-CM

## 2017-08-23 DIAGNOSIS — J449 Chronic obstructive pulmonary disease, unspecified: Secondary | ICD-10-CM | POA: Diagnosis not present

## 2017-08-23 DIAGNOSIS — J441 Chronic obstructive pulmonary disease with (acute) exacerbation: Secondary | ICD-10-CM

## 2017-08-23 MED ORDER — FLUTICASONE-UMECLIDIN-VILANT 100-62.5-25 MCG/INH IN AEPB: 1.0000 | INHALATION_SPRAY | Freq: Every day | RESPIRATORY_TRACT | 1 refills | Status: DC

## 2017-08-23 MED ORDER — HYDROCODONE-HOMATROPINE 5-1.5 MG/5ML PO SYRP: 5.0000 mL | ORAL_SOLUTION | Freq: Four times a day (QID) | ORAL | 0 refills | Status: DC | PRN

## 2017-08-23 NOTE — Patient Instructions (Signed)

## 2017-08-23 NOTE — Progress Notes (Signed)
   Subjective:    Patient ID: Stacy Dalton, female    DOB: Apr 26, 1951, 67 y.o.   MRN: [id number]  Cough  This is a chronic problem. The current episode started more than 1 year ago. The problem has been unchanged. The problem occurs every few minutes. The cough is non-productive. Associated symptoms include wheezing. Pertinent negatives include no chills, ear congestion, ear pain or sore throat. She has tried rest and OTC cough suppressant for the symptoms. The treatment provided mild relief. Her past medical history is significant for COPD.      Review of Systems  Constitutional: Negative for chills.  HENT: Negative for ear pain and sore throat.   Respiratory: Positive for cough and wheezing.   All other systems reviewed and are negative.      Objective:   Physical Exam  Constitutional: She is oriented to person, place, and time. She appears well-developed and well-nourished. No distress.  HENT:  Head: Normocephalic and atraumatic.  Right Ear: External ear normal.  Left Ear: External ear normal.  Mouth/Throat: Oropharynx is clear and moist.  Eyes: Pupils are equal, round, and reactive to light.  Neck: Normal range of motion. Neck supple. No thyromegaly present.  Cardiovascular: Normal rate, regular rhythm, normal heart sounds and intact distal pulses.  No murmur heard. Pulmonary/Chest: Effort normal and breath sounds normal. No respiratory distress. She has no wheezes.  Constant nonproductive cough  Abdominal: Soft. Bowel sounds are normal. She exhibits no distension. There is no tenderness.  Musculoskeletal: Normal range of motion. She exhibits no edema or tenderness.  Neurological: She is alert and oriented to person, place, and time.  Skin: Skin is warm and dry.  Psychiatric: She has a normal mood and affect. Her behavior is normal. Judgment and thought content normal.  Vitals reviewed.    BP 136/74   Pulse 99   Temp 97.9 F (36.6 C) (Oral)   Ht 5' (1.524 m)   Wt  124 lb (56.2 kg)   BMI 24.22 kg/m      Assessment & Plan:  1. Chronic obstructive pulmonary disease with acute exacerbation (HCC) Will change Advair and Spiriva to Trelegy Pt may need CT scan Will do Pulmonologists referral  - DG Chest 2 View; Future - Fluticasone-Umeclidin-Vilant (TRELEGY ELLIPTA) 100-62.5-25 MCG/INH AEPB; Inhale 1 puff into the lungs daily.  Dispense: 3 each; Refill: 1 - HYDROcodone-homatropine (HYCODAN) 5-1.5 MG/5ML syrup; Take 5 mLs by mouth every 6 (six) hours as needed.  Dispense: 240 mL; Refill: 0 - Ambulatory referral to Pulmonology  2. Cough - DG Chest 2 View; Future - Ambulatory referral to Pulmonology    Evelina Dun, FNP

## 2017-08-24 ENCOUNTER — Telehealth: Payer: Self-pay | Admitting: Family

## 2017-08-24 DIAGNOSIS — M546 Pain in thoracic spine: Principal | ICD-10-CM

## 2017-08-24 DIAGNOSIS — G8929 Other chronic pain: Secondary | ICD-10-CM

## 2017-08-24 MED ORDER — ONDANSETRON 4 MG PO TBDP: ORAL_TABLET | ORAL | 1 refills | Status: DC

## 2017-08-24 NOTE — Telephone Encounter (Signed)
Please advise 

## 2017-08-24 NOTE — Telephone Encounter (Signed)
Prescription sent to pharmacy.

## 2017-08-27 ENCOUNTER — Other Ambulatory Visit: Payer: Self-pay | Admitting: Family

## 2017-08-27 ENCOUNTER — Other Ambulatory Visit: Payer: Self-pay

## 2017-08-27 DIAGNOSIS — M546 Pain in thoracic spine: Principal | ICD-10-CM

## 2017-08-27 DIAGNOSIS — G8929 Other chronic pain: Secondary | ICD-10-CM

## 2017-08-27 NOTE — Patient Outreach (Signed)
[place] [facility]) Care Management  08/27/2017  Stacy Dalton 1951-02-17 [id number]   Patient has not responded to calls or letter. Will proceed with case closure.    Plan: RN Health Coach will notify care management assistant of case status.    Lazaro Arms RN, BSN, [place] Direct Dial:  [phone] Fax: [phone]

## 2017-08-29 ENCOUNTER — Ambulatory Visit: Payer: Medicare Other

## 2017-08-29 ENCOUNTER — Telehealth: Payer: Self-pay | Admitting: Family

## 2017-08-30 NOTE — Telephone Encounter (Signed)
Pt aware chest x-ray negative for pneumonia.

## 2017-09-02 ENCOUNTER — Other Ambulatory Visit: Payer: Self-pay | Admitting: Family

## 2017-09-04 ENCOUNTER — Other Ambulatory Visit: Payer: Self-pay | Admitting: *Deleted

## 2017-09-05 ENCOUNTER — Other Ambulatory Visit: Payer: Self-pay | Admitting: Family

## 2017-09-05 MED ORDER — TEMAZEPAM 30 MG PO CAPS: 30.0000 mg | ORAL_CAPSULE | Freq: Every evening | ORAL | 0 refills | Status: DC | PRN

## 2017-09-20 ENCOUNTER — Other Ambulatory Visit: Payer: Self-pay | Admitting: Family

## 2017-09-20 DIAGNOSIS — F1120 Opioid dependence, uncomplicated: Secondary | ICD-10-CM

## 2017-09-20 DIAGNOSIS — G894 Chronic pain syndrome: Secondary | ICD-10-CM

## 2017-09-20 DIAGNOSIS — Z0289 Encounter for other administrative examinations: Secondary | ICD-10-CM

## 2017-09-20 DIAGNOSIS — G8929 Other chronic pain: Secondary | ICD-10-CM

## 2017-09-20 DIAGNOSIS — M546 Pain in thoracic spine: Secondary | ICD-10-CM

## 2017-09-21 ENCOUNTER — Ambulatory Visit (INDEPENDENT_AMBULATORY_CARE_PROVIDER_SITE_OTHER): Payer: Medicare Other | Admitting: Family

## 2017-09-21 ENCOUNTER — Encounter: Payer: Self-pay | Admitting: Family

## 2017-09-21 ENCOUNTER — Other Ambulatory Visit: Payer: Self-pay | Admitting: Family

## 2017-09-21 VITALS — BP 128/83 | HR 98 | Temp 97.6°F | Ht 60.0 in | Wt 120.0 lb

## 2017-09-21 DIAGNOSIS — G8929 Other chronic pain: Secondary | ICD-10-CM

## 2017-09-21 DIAGNOSIS — Z0289 Encounter for other administrative examinations: Secondary | ICD-10-CM

## 2017-09-21 DIAGNOSIS — G894 Chronic pain syndrome: Secondary | ICD-10-CM | POA: Diagnosis not present

## 2017-09-21 DIAGNOSIS — F1120 Opioid dependence, uncomplicated: Secondary | ICD-10-CM | POA: Diagnosis not present

## 2017-09-21 DIAGNOSIS — M546 Pain in thoracic spine: Secondary | ICD-10-CM | POA: Diagnosis not present

## 2017-09-21 DIAGNOSIS — J441 Chronic obstructive pulmonary disease with (acute) exacerbation: Secondary | ICD-10-CM | POA: Diagnosis not present

## 2017-09-21 MED ORDER — TRAMADOL HCL 50 MG PO TABS: 50.0000 mg | ORAL_TABLET | Freq: Two times a day (BID) | ORAL | 2 refills | Status: DC | PRN

## 2017-09-21 MED ORDER — HYDROCODONE-HOMATROPINE 5-1.5 MG/5ML PO SYRP: 5.0000 mL | ORAL_SOLUTION | Freq: Three times a day (TID) | ORAL | 0 refills | Status: DC | PRN

## 2017-09-21 NOTE — Progress Notes (Signed)
Subjective:    Patient ID: Stacy Dalton, female    DOB: 1950/11/08, 67 y.o.   MRN: [id number]  PT presents to the office today for COPD and cough. PT has been seen multiple times for this and takes Trelegy daily. Pt has appt with Pulmonologist 09/27/17.    Cough  This is a chronic problem. The current episode started more than 1 year ago. The problem has been unchanged. The problem occurs every few minutes. The cough is non-productive. Associated symptoms include nasal congestion, postnasal drip, shortness of breath and wheezing. Pertinent negatives include no chills, ear congestion, ear pain, headaches or myalgias. The symptoms are aggravated by lying down. She has tried rest, oral steroids and OTC cough suppressant for the symptoms. The treatment provided mild relief. Her past medical history is significant for COPD.  Back Pain  This is a chronic problem. The current episode started more than 1 year ago. The problem occurs intermittently. The problem has been waxing and waning since onset. The pain is present in the lumbar spine. The quality of the pain is described as aching. The pain is at a severity of 8/10. The pain is moderate. Pertinent negatives include no headaches.      Review of Systems  Constitutional: Negative for chills.  HENT: Positive for postnasal drip. Negative for ear pain.   Respiratory: Positive for cough, shortness of breath and wheezing.   Musculoskeletal: Positive for back pain. Negative for myalgias.  Neurological: Negative for headaches.  All other systems reviewed and are negative.      Objective:   Physical Exam  Constitutional: She is oriented to person, place, and time. She appears well-developed and well-nourished. No distress.  HENT:  Head: Normocephalic and atraumatic.  Right Ear: External ear normal.  Left Ear: External ear normal.  Nose: Nose normal.  Mouth/Throat: Oropharynx is clear and moist.  Eyes: Pupils are equal, round, and reactive to  light.  Neck: Normal range of motion. Neck supple. No thyromegaly present.  Cardiovascular: Normal rate, regular rhythm, normal heart sounds and intact distal pulses.  No murmur heard. Pulmonary/Chest: Effort normal and breath sounds normal. No respiratory distress. She has no wheezes.  Intermittent nonproductive   Abdominal: Soft. Bowel sounds are normal. She exhibits no distension. There is no tenderness.  Musculoskeletal: Normal range of motion. She exhibits no edema or tenderness.  Neurological: She is alert and oriented to person, place, and time.  Skin: Skin is warm and dry.  Psychiatric: She has a normal mood and affect. Her behavior is normal. Judgment and thought content normal.  Vitals reviewed.     BP 128/83   Pulse 98   Temp 97.6 F (36.4 C) (Oral)   Ht 5' (1.524 m)   Wt 120 lb (54.4 kg)   BMI 23.44 kg/m      Assessment & Plan:  1. Chronic obstructive pulmonary disease with acute exacerbation (HCC) - HYDROcodone-homatropine (HYCODAN) 5-1.5 MG/5ML syrup; Take 5 mLs by mouth every 8 (eight) hours as needed.  Dispense: 240 mL; Refill: 0  2. Pain medication agreement signed - traMADol (ULTRAM) 50 MG tablet; Take 1-2 tablets (50-100 mg total) by mouth every 12 (twelve) hours as needed (pain).  Dispense: 120 tablet; Refill: 2  3. Uncomplicated opioid dependence (HCC) - traMADol (ULTRAM) 50 MG tablet; Take 1-2 tablets (50-100 mg total) by mouth every 12 (twelve) hours as needed (pain).  Dispense: 120 tablet; Refill: 2  4. Chronic bilateral thoracic back pain - traMADol (ULTRAM) 50 MG  tablet; Take 1-2 tablets (50-100 mg total) by mouth every 12 (twelve) hours as needed (pain).  Dispense: 120 tablet; Refill: 2  5. Chronic pain syndrome - traMADol (ULTRAM) 50 MG tablet; Take 1-2 tablets (50-100 mg total) by mouth every 12 (twelve) hours as needed (pain).  Dispense: 120 tablet; Refill: 2   Continue all meds Labs pending Health Maintenance reviewed Diet and exercise  encouraged RTO 3 months   Evelina Dun, FNP

## 2017-09-21 NOTE — Patient Instructions (Signed)
Cough, Adult  Coughing is a reflex that clears your throat and your airways. Coughing helps to heal and protect your lungs. It is normal to cough occasionally, but a cough that happens with other symptoms or lasts a long time may be a sign of a condition that needs treatment. A cough may last only 2-3 weeks (acute), or it may last longer than 8 weeks (chronic).  What are the causes?  Coughing is commonly caused by:   Breathing in substances that irritate your lungs.   A viral or bacterial respiratory infection.   Allergies.   Asthma.   Postnasal drip.   Smoking.   Acid backing up from the stomach into the esophagus (gastroesophageal reflux).   Certain medicines.   Chronic lung problems, including COPD (or rarely, lung cancer).   Other medical conditions such as heart failure.    Follow these instructions at home:  Pay attention to any changes in your symptoms. Take these actions to help with your discomfort:   Take medicines only as told by your health care provider.  ? If you were prescribed an antibiotic medicine, take it as told by your health care provider. Do not stop taking the antibiotic even if you start to feel better.  ? Talk with your health care provider before you take a cough suppressant medicine.   Drink enough fluid to keep your urine clear or pale yellow.   If the air is dry, use a cold steam vaporizer or humidifier in your bedroom or your home to help loosen secretions.   Avoid anything that causes you to cough at work or at home.   If your cough is worse at night, try sleeping in a semi-upright position.   Avoid cigarette smoke. If you smoke, quit smoking. If you need help quitting, ask your health care provider.   Avoid caffeine.   Avoid alcohol.   Rest as needed.    Contact a health care provider if:   You have new symptoms.   You cough up pus.   Your cough does not get better after 2-3 weeks, or your cough gets worse.   You cannot control your cough with suppressant  medicines and you are losing sleep.   You develop pain that is getting worse or pain that is not controlled with pain medicines.   You have a fever.   You have unexplained weight loss.   You have night sweats.  Get help right away if:   You cough up blood.   You have difficulty breathing.   Your heartbeat is very fast.  This information is not intended to replace advice given to you by your health care provider. Make sure you discuss any questions you have with your health care provider.  Document Released: 12/16/2010 Document Revised: 11/25/2015 Document Reviewed: 08/26/2014  Elsevier Interactive Patient Education  2018 [employer]

## 2017-09-27 ENCOUNTER — Institutional Professional Consult (permissible substitution): Payer: Medicare Other | Admitting: Pulmonary Disease

## 2017-10-05 ENCOUNTER — Ambulatory Visit (INDEPENDENT_AMBULATORY_CARE_PROVIDER_SITE_OTHER): Payer: Medicare Other | Admitting: Family

## 2017-10-05 ENCOUNTER — Encounter: Payer: Self-pay | Admitting: Family

## 2017-10-05 VITALS — BP 132/76 | HR 105 | Temp 98.3°F | Ht 60.0 in | Wt 119.4 lb

## 2017-10-05 DIAGNOSIS — J441 Chronic obstructive pulmonary disease with (acute) exacerbation: Secondary | ICD-10-CM | POA: Diagnosis not present

## 2017-10-05 DIAGNOSIS — R05 Cough: Secondary | ICD-10-CM

## 2017-10-05 DIAGNOSIS — R059 Cough, unspecified: Secondary | ICD-10-CM

## 2017-10-05 MED ORDER — HYDROCODONE-HOMATROPINE 5-1.5 MG/5ML PO SYRP: 5.0000 mL | ORAL_SOLUTION | Freq: Three times a day (TID) | ORAL | 0 refills | Status: DC | PRN

## 2017-10-05 NOTE — Patient Instructions (Signed)
Cough, Adult  Coughing is a reflex that clears your throat and your airways. Coughing helps to heal and protect your lungs. It is normal to cough occasionally, but a cough that happens with other symptoms or lasts a long time may be a sign of a condition that needs treatment. A cough may last only 2-3 weeks (acute), or it may last longer than 8 weeks (chronic).  What are the causes?  Coughing is commonly caused by:   Breathing in substances that irritate your lungs.   A viral or bacterial respiratory infection.   Allergies.   Asthma.   Postnasal drip.   Smoking.   Acid backing up from the stomach into the esophagus (gastroesophageal reflux).   Certain medicines.   Chronic lung problems, including COPD (or rarely, lung cancer).   Other medical conditions such as heart failure.    Follow these instructions at home:  Pay attention to any changes in your symptoms. Take these actions to help with your discomfort:   Take medicines only as told by your health care provider.  ? If you were prescribed an antibiotic medicine, take it as told by your health care provider. Do not stop taking the antibiotic even if you start to feel better.  ? Talk with your health care provider before you take a cough suppressant medicine.   Drink enough fluid to keep your urine clear or pale yellow.   If the air is dry, use a cold steam vaporizer or humidifier in your bedroom or your home to help loosen secretions.   Avoid anything that causes you to cough at work or at home.   If your cough is worse at night, try sleeping in a semi-upright position.   Avoid cigarette smoke. If you smoke, quit smoking. If you need help quitting, ask your health care provider.   Avoid caffeine.   Avoid alcohol.   Rest as needed.    Contact a health care provider if:   You have new symptoms.   You cough up pus.   Your cough does not get better after 2-3 weeks, or your cough gets worse.   You cannot control your cough with suppressant  medicines and you are losing sleep.   You develop pain that is getting worse or pain that is not controlled with pain medicines.   You have a fever.   You have unexplained weight loss.   You have night sweats.  Get help right away if:   You cough up blood.   You have difficulty breathing.   Your heartbeat is very fast.  This information is not intended to replace advice given to you by your health care provider. Make sure you discuss any questions you have with your health care provider.  Document Released: 12/16/2010 Document Revised: 11/25/2015 Document Reviewed: 08/26/2014  Elsevier Interactive Patient Education  2018 [employer]

## 2017-10-05 NOTE — Progress Notes (Signed)
   Subjective:    Patient ID: Stacy Dalton, female    DOB: [birth year], 67 y.o.   MRN: [id number]  PT presents to the office today for COPD and recurrent cough. Pt has appt with Pulmonologist's on 10/09/17. Pt taking Trelegy, Zyrtec daily and taking albuterol about 4 times a day.  Cough  This is a chronic problem. The current episode started more than 1 year ago. The problem has been waxing and waning. The problem occurs every few minutes. The cough is non-productive. Associated symptoms include postnasal drip, shortness of breath and wheezing. Pertinent negatives include no chills, ear congestion, ear pain, myalgias, sore throat or weight loss. The symptoms are aggravated by lying down. She has tried rest and OTC cough suppressant for the symptoms. The treatment provided mild relief. Her past medical history is significant for COPD.      Review of Systems  Constitutional: Negative for chills and weight loss.  HENT: Positive for postnasal drip. Negative for ear pain and sore throat.   Respiratory: Positive for cough, shortness of breath and wheezing.   Musculoskeletal: Negative for myalgias.  All other systems reviewed and are negative.      Objective:   Physical Exam  Constitutional: She is oriented to person, place, and time. She appears well-developed and well-nourished. No distress.  HENT:  Head: Normocephalic and atraumatic.  Right Ear: External ear normal.  Nose: Mucosal edema and rhinorrhea present.  Mouth/Throat: Posterior oropharyngeal erythema present.  Eyes: Pupils are equal, round, and reactive to light.  Neck: Normal range of motion. Neck supple. No thyromegaly present.  Cardiovascular: Normal rate, regular rhythm, normal heart sounds and intact distal pulses.  No murmur heard. Pulmonary/Chest: Effort normal and breath sounds normal. No respiratory distress. She has no wheezes.  Intermittent dry nonproductive cough  Abdominal: Soft. Bowel sounds are normal. She  exhibits no distension. There is no tenderness.  Musculoskeletal: Normal range of motion. She exhibits no edema or tenderness.  Neurological: She is alert and oriented to person, place, and time. She has normal reflexes. No cranial nerve deficit.  Skin: Skin is warm and dry.  Psychiatric: She has a normal mood and affect. Her behavior is normal. Judgment and thought content normal.  Vitals reviewed.     BP 132/76   Pulse (!) 105   Temp 98.3 F (36.8 C) (Oral)   Ht 5' (1.524 m)   Wt 119 lb 6.4 oz (54.2 kg)   BMI 23.32 kg/m      Assessment & Plan:  1. Chronic obstructive pulmonary disease with acute exacerbation (HCC) Continue inhalers Only take albuterol as needed for wheezing Avoid allergens  - HYDROcodone-homatropine (HYCODAN) 5-1.5 MG/5ML syrup; Take 5 mLs by mouth every 8 (eight) hours as needed.  Dispense: 473 mL; Refill: 0  2. Cough Restart Flonase and continue zyrtec Pt denies GERD symptoms and does not wish to try PPI Pt has had abuse of controlled medications in past and I can't help to think she may be abusing the cough syrup? We will keep Pulmonologist appt to see what recommendations they have     Evelina Dun, FNP

## 2017-10-09 ENCOUNTER — Institutional Professional Consult (permissible substitution): Payer: Medicare Other | Admitting: Pulmonary Disease

## 2017-10-11 ENCOUNTER — Telehealth: Payer: Self-pay | Admitting: Family

## 2017-10-11 DIAGNOSIS — G8929 Other chronic pain: Secondary | ICD-10-CM

## 2017-10-11 DIAGNOSIS — M546 Pain in thoracic spine: Principal | ICD-10-CM

## 2017-10-11 MED ORDER — ONDANSETRON 4 MG PO TBDP
ORAL_TABLET | ORAL | 1 refills | Status: DC
Start: 2017-10-11 — End: 2017-12-24

## 2017-10-11 NOTE — Telephone Encounter (Signed)
Prescription sent to pharmacy.

## 2017-10-30 ENCOUNTER — Other Ambulatory Visit (INDEPENDENT_AMBULATORY_CARE_PROVIDER_SITE_OTHER): Payer: Medicare Other

## 2017-10-30 ENCOUNTER — Ambulatory Visit (INDEPENDENT_AMBULATORY_CARE_PROVIDER_SITE_OTHER): Payer: Medicare Other | Admitting: Pulmonary Disease

## 2017-10-30 ENCOUNTER — Encounter: Payer: Self-pay | Admitting: Pulmonary Disease

## 2017-10-30 VITALS — BP 118/58 | HR 97 | Ht 61.0 in | Wt 122.2 lb

## 2017-10-30 DIAGNOSIS — J449 Chronic obstructive pulmonary disease, unspecified: Secondary | ICD-10-CM

## 2017-10-30 LAB — CBC WITH DIFFERENTIAL/PLATELET
Basophils Absolute: 0.1 10*3/uL (ref 0.0–0.1)
Basophils Relative: 0.6 % (ref 0.0–3.0)
Eosinophils Absolute: 0 10*3/uL (ref 0.0–0.7)
Eosinophils Relative: 0.1 % (ref 0.0–5.0)
HCT: 41.4 % (ref 36.0–46.0)
Hemoglobin: 14 g/dL (ref 12.0–15.0)
Lymphocytes Relative: 8.9 % — ABNORMAL LOW (ref 12.0–46.0)
Lymphs Abs: 0.8 10*3/uL (ref 0.7–4.0)
MCHC: 33.7 g/dL (ref 30.0–36.0)
MCV: 87.5 fl (ref 78.0–100.0)
Monocytes Absolute: 0.5 10*3/uL (ref 0.1–1.0)
Monocytes Relative: 5.8 % (ref 3.0–12.0)
Neutro Abs: 7.9 10*3/uL — ABNORMAL HIGH (ref 1.4–7.7)
Neutrophils Relative %: 84.6 % — ABNORMAL HIGH (ref 43.0–77.0)
Platelets: 333 10*3/uL (ref 150.0–400.0)
RBC: 4.73 Mil/uL (ref 3.87–5.11)
RDW: 13.5 % (ref 11.5–15.5)
WBC: 9.3 10*3/uL (ref 4.0–10.5)

## 2017-10-30 NOTE — Progress Notes (Signed)
Stacy Dalton    [id number]    [birth year]  Primary Care Physician:Hawks, Theador Hawthorne, FNP  Referring Physician: [name], [address] [place], [place] [address]  Chief complaint: Consult for COPD  HPI: 67 year old with hypertension, hyperlipidemia COPD She was diagnosed with COPD in 2016 when she went to urgent care with dyspnea, cough.  She is being followed up with her primary care and seen several times over the past few months with cough, COPD exacerbation.  Trelegy inhaler started a couple of months ago. Chief complaint is dyspnea with activity and rest, cough with white mucus.  Denies any wheezing, fevers, chills.  Pets: Dog, no cats, birds Occupation: Retired Psychologist, counselling Exposures: She had a hot water tank leak in 2017.  Only recently she realized that there was water damage with mold from this and she is in the process of getting this fixed Smoking history: 30-pack-year smoking history.  Quit in the year 2000 Travel history: Not significant Relevant family history: Asthma with uncle, son  Outpatient Encounter Medications as of 10/30/2017  Medication Sig  . albuterol (VENTOLIN HFA) 108 (90 Base) MCG/ACT inhaler Inhale into the lungs every 6 (six) hours as needed for wheezing or shortness of breath.  . Aspirin-Salicylamide-Caffeine (BC HEADACHE POWDER PO) Take by mouth. Use 1-2 packs daily PRN headache  . AZOR 10-40 MG tablet TAKE 1 TABLET DAILY  . cetirizine (ZYRTEC) 10 MG tablet Take 1 tablet (10 mg total) by mouth daily.  [name] docusate sodium (COLACE) 100 MG capsule Take 1 capsule (100 mg total) by mouth 2 (two) times daily as needed for mild constipation.  . DULoxetine (CYMBALTA) 60 MG capsule Take 1 capsule (60 mg total) by mouth daily.  . ferrous sulfate 325 (65 FE) MG tablet Take 325 mg by mouth daily with breakfast.  . fluticasone (FLONASE) 50 MCG/ACT nasal spray Place 2 sprays into both nostrils daily.  . Fluticasone-Umeclidin-Vilant  (TRELEGY ELLIPTA) 100-62.5-25 MCG/INH AEPB Inhale 1 puff into the lungs daily.  [name] HYDROcodone-homatropine (HYCODAN) 5-1.5 MG/5ML syrup Take 5 mLs by mouth every 8 (eight) hours as needed.  . ondansetron (ZOFRAN-ODT) 4 MG disintegrating tablet DISSOLVE 1 TABLET(4 MG) ON THE TONGUE EVERY 8 HOURS AS NEEDED  . SYNTHROID 50 MCG tablet TAKE 1 TABLET DAILY  . temazepam (RESTORIL) 30 MG capsule Take 1 capsule (30 mg total) by mouth at bedtime as needed for sleep.  . traMADol (ULTRAM) 50 MG tablet Take 1-2 tablets (50-100 mg total) by mouth every 12 (twelve) hours as needed (pain).  . triamterene-hydrochlorothiazide (DYAZIDE) 37.5-25 MG capsule TAKE 1 CAPSULE DAILY  . Vitamin D, Ergocalciferol, (DRISDOL) 50000 units CAPS capsule TAKE 1 CAPSULE EVERY 7 DAYS (Patient taking differently: TAKE 1 CAPSULE (50000 UNITS) BY MOUTH EVERY 7 DAYS)  . atorvastatin (LIPITOR) 40 MG tablet TAKE ONE-HALF (1/2) TABLET DAILY (CHANGE IN DIRECTIONS) (Patient not taking: Reported on 10/30/2017)   No facility-administered encounter medications on file as of 10/30/2017.     Allergies as of 10/30/2017 - Review Complete 10/30/2017  Allergen Reaction Noted  . Toradol [ketorolac tromethamine] Shortness Of Breath 09/01/2012  . Demerol Swelling 10/04/2011  . Esgic [butalbital-apap-caffeine] Other (See Comments) 10/04/2011  . Iodine Other (See Comments) 10/04/2011    Past Medical History:  Diagnosis Date  . COPD (chronic obstructive pulmonary disease) ([place])    told has copd, no current inhaler use  . Depression   . GERD (gastroesophageal reflux disease)   . Hypertension   .  Hypothyroidism   . Insomnia   . Migraine   . Migraine   . Osteopenia   . Panic attacks     Past Surgical History:  Procedure Laterality Date  . ABDOMINAL HYSTERECTOMY    . COLON SURGERY    . COLOSTOMY CLOSURE  04/19/2012   Procedure: COLOSTOMY CLOSURE;  Surgeon: Adin Hector, MD;  Location: [facility];  Service: General;  Laterality: N/A;   Laparotomy, Resection and Closure of Colostomy  . COLOSTOMY TAKEDOWN N/A 04/12/2016   Procedure: [place] TAKEDOWN;  Surgeon: Johnathan Hausen, MD;  Location: [facility];  Service: General;  Laterality: N/A;  . INCONTINENCE SURGERY    . LAPAROTOMY  10/04/2011, colostomy also   Procedure: EXPLORATORY LAPAROTOMY;  Surgeon: Adin Hector, MD;  Location: [facility];  Service: General;  Laterality: N/A;  left partial colectomy with colostomy  . LAPAROTOMY  04/19/2012   Procedure: EXPLORATORY LAPAROTOMY;  Surgeon: Adin Hector, MD;  Location: [facility];  Service: General;  Laterality: N/A;  . LAPAROTOMY N/A 11/29/2015   Procedure: EXPLORATORY LAPAROTOMY; SUBTOTAL COLECTOMY WITH HARTMAN PROCEDURE AND END COLOSTOMY;  Surgeon: Johnathan Hausen, MD;  Location: [facility];  Service: General;  Laterality: N/A;  . TUBAL LIGATION    . VENTRAL HERNIA REPAIR  04/19/2012   Procedure: HERNIA REPAIR VENTRAL ADULT;  Surgeon: Adin Hector, MD;  Location: [facility];  Service: General;  Laterality: N/A;    Family History  Problem Relation Age of Onset  . Heart disease Mother   . Hypertension Mother   . Cancer Sister        sinus  . Diabetes Brother   . Heart disease Brother   . Thyroid disease Brother   . Cancer Sister        abdominal ?  . Thyroid disease Sister   . Cancer Other        GE junction adenocarcinoma  . Emphysema Father   . Heart disease Father   . Stroke Father   . Hypertension Father   . COPD Sister   . Thyroid disease Sister   . Thyroid disease Sister   . Diabetes Brother   . Thyroid disease Brother   . Thyroid disease Brother   . Hypertension Brother   . Post-traumatic stress disorder Brother   . COPD Brother   . Thyroid disease Brother   . Thyroid disease Brother   . Hypertension Brother   . Transient ischemic attack Brother     Social History   Socioeconomic History  . Marital status: Widowed    Spouse name: Not on file  . Number of children: Not on file  . Years of education: Not  on file  . Highest education level: Not on file  Occupational History  . Not on file  Social Needs  . Financial resource strain: Not on file  . Food insecurity:    Worry: Not on file    Inability: Not on file  . Transportation needs:    Medical: Not on file    Non-medical: Not on file  Tobacco Use  . Smoking status: Former Smoker    Packs/day: 1.50    Years: 20.00    Pack years: 30.00    Types: Cigarettes    Last attempt to quit: 10/04/1998    Years since quitting: 19.0  . Smokeless tobacco: Never Used  Substance and Sexual Activity  . Alcohol use: No  . Drug use: No  . Sexual activity: Never  Lifestyle  . Physical activity:  Days per week: Not on file    Minutes per session: Not on file  . Stress: Not on file  Relationships  . Social connections:    Talks on phone: Not on file    Gets together: Not on file    Attends religious service: Not on file    Active member of club or organization: Not on file    Attends meetings of clubs or organizations: Not on file    Relationship status: Not on file  . Intimate partner violence:    Fear of current or ex partner: Not on file    Emotionally abused: Not on file    Physically abused: Not on file    Forced sexual activity: Not on file  Other Topics Concern  . Not on file  Social History Narrative   married    Review of systems: Review of Systems  Constitutional: Negative for fever and chills.  HENT: Negative.   Eyes: Negative for blurred vision.  Respiratory: as per HPI  Cardiovascular: Negative for chest pain and palpitations.  Gastrointestinal: Negative for vomiting, diarrhea, blood per rectum. Genitourinary: Negative for dysuria, urgency, frequency and hematuria.  Musculoskeletal: Negative for myalgias, back pain and joint pain.  Skin: Negative for itching and rash.  Neurological: Negative for dizziness, tremors, focal weakness, seizures and loss of consciousness.  Endo/Heme/Allergies: Negative for environmental  allergies.  Psychiatric/Behavioral: Negative for depression, suicidal ideas and hallucinations.  All other systems reviewed and are negative.  Physical Exam: Blood pressure (!) 118/58, pulse 97, height 5\' 1"  (1.549 m), weight 122 lb 3.2 oz (55.4 kg), SpO2 96 %. Gen:      No acute distress HEENT:  EOMI, sclera anicteric Neck:     No masses; no thyromegaly Lungs:    Clear to auscultation bilaterally; normal respiratory effort CV:         Regular rate and rhythm; no murmurs Abd:      + bowel sounds; soft, non-tender; no palpable masses, no distension Ext:    No edema; adequate peripheral perfusion Skin:      Warm and dry; no rash Neuro: alert and oriented x 3 Psych: normal mood and affect  Data Reviewed: CT abdomen pelvis 11/29/2015-visualized lung bases are clear Chest x-ray 08/23/2017-no acute cardiopulmonary abnormality I reviewed the images personally  CBC 06/21/2017-WBC 7.3, eos 4%, absolute eosinophil count 300  Assessment:  Assessment for COPD Schedule pulmonary function test She is on a good inhaler regimen with Trelegy and albuterol and will continue the same  History noted for mold exposure from water leak.  Will check CBC differential, blood allergy profile and mold antibody panel Check alpha-1 antitrypsin levels and phenotype.  Plan/Recommendations: - CBC differential, blood allergy profile, mold antibody panel, alpha-1 antitrypsin levels and phenotype - PFTs - Continue trelegy and albuterol.  Marshell Garfinkel MD [facility] 10/30/2017, 3:50 PM  CC: [name], FNP

## 2017-10-30 NOTE — Patient Instructions (Addendum)
We will get CBC differential, blood allergy profile, mold antibody panel, alpha-1 antitrypsin level and phenotype We will schedule you for pulmonary function test Continue using the trelegy and albuterol Follow-up in 1 to 2 months.

## 2017-10-31 ENCOUNTER — Telehealth: Payer: Self-pay | Admitting: Family

## 2017-10-31 LAB — RESPIRATORY ALLERGY PROFILE REGION II ~~LOC~~

## 2017-10-31 LAB — INTERPRETATION:

## 2017-10-31 LAB — ALLERGY PANEL 11, MOLD GROUP
Allergen, A. alternata, m6: <0.1 kU/L
Allergen, Mucor Racemosus, M4: <0.1 kU/L
Aspergillus fumigatus, m3: <0.1 kU/L
CLADOSPORIUM HERBARUM (M2) IGE: <0.1 kU/L
CLASS: 0
CLASS: 0
Candida Albicans: <0.1 kU/L
Class: 0
Class: 0
Class: 0

## 2017-10-31 NOTE — Telephone Encounter (Signed)
Patient needs to be seen.

## 2017-11-01 ENCOUNTER — Ambulatory Visit (INDEPENDENT_AMBULATORY_CARE_PROVIDER_SITE_OTHER): Payer: Medicare Other | Admitting: Family

## 2017-11-01 ENCOUNTER — Encounter: Payer: Self-pay | Admitting: Family

## 2017-11-01 VITALS — BP 133/74 | HR 102 | Temp 99.5°F | Ht 61.0 in | Wt 118.8 lb

## 2017-11-01 DIAGNOSIS — J441 Chronic obstructive pulmonary disease with (acute) exacerbation: Secondary | ICD-10-CM

## 2017-11-01 DIAGNOSIS — G8929 Other chronic pain: Secondary | ICD-10-CM

## 2017-11-01 DIAGNOSIS — E785 Hyperlipidemia, unspecified: Secondary | ICD-10-CM | POA: Diagnosis not present

## 2017-11-01 DIAGNOSIS — K219 Gastro-esophageal reflux disease without esophagitis: Secondary | ICD-10-CM | POA: Diagnosis not present

## 2017-11-01 DIAGNOSIS — E559 Vitamin D deficiency, unspecified: Secondary | ICD-10-CM | POA: Diagnosis not present

## 2017-11-01 DIAGNOSIS — E039 Hypothyroidism, unspecified: Secondary | ICD-10-CM

## 2017-11-01 DIAGNOSIS — I10 Essential (primary) hypertension: Secondary | ICD-10-CM | POA: Diagnosis not present

## 2017-11-01 DIAGNOSIS — M546 Pain in thoracic spine: Secondary | ICD-10-CM

## 2017-11-01 DIAGNOSIS — G4700 Insomnia, unspecified: Secondary | ICD-10-CM

## 2017-11-01 DIAGNOSIS — F1120 Opioid dependence, uncomplicated: Secondary | ICD-10-CM

## 2017-11-01 DIAGNOSIS — F331 Major depressive disorder, recurrent, moderate: Secondary | ICD-10-CM | POA: Diagnosis not present

## 2017-11-01 DIAGNOSIS — F419 Anxiety disorder, unspecified: Secondary | ICD-10-CM

## 2017-11-01 MED ORDER — HYDROCODONE-HOMATROPINE 5-1.5 MG/5ML PO SYRP: 5.0000 mL | ORAL_SOLUTION | Freq: Three times a day (TID) | ORAL | 0 refills | Status: DC | PRN

## 2017-11-01 NOTE — Progress Notes (Signed)
Subjective:    Patient ID: Stacy Dalton, female    DOB: Mar 01, 1951, 67 y.o.   MRN: [id number]  Chief Complaint  Patient presents with  . COPD    medication refills  . Hyperlipidemia  . Hypothyroidism   Hypertension  This is a chronic problem. The current episode started more than 1 year ago. The problem has been resolved since onset. The problem is controlled. Associated symptoms include anxiety, malaise/fatigue and shortness of breath. Pertinent negatives include no peripheral edema. Risk factors for coronary artery disease include dyslipidemia. The current treatment provides mild improvement. There is no history of kidney disease, CAD/MI or heart failure. Identifiable causes of hypertension include a thyroid problem.  Hyperlipidemia  This is a chronic problem. The current episode started more than 1 year ago. The problem is uncontrolled. Recent lipid tests were reviewed and are high. Associated symptoms include shortness of breath. Pertinent negatives include no leg pain. Current antihyperlipidemic treatment includes herbal therapy. The current treatment provides mild improvement of lipids. Risk factors for coronary artery disease include dyslipidemia, hypertension and post-menopausal.  Gastroesophageal Reflux  She complains of coughing, a hoarse voice and wheezing. She reports no belching or no heartburn. This is a chronic problem. The current episode started more than 1 year ago. The problem occurs occasionally. The problem has been waxing and waning. Associated symptoms include fatigue. The treatment provided mild relief.  Thyroid Problem  Presents for follow-up visit. Symptoms include anxiety, depressed mood, fatigue and hoarse voice. Patient reports no diarrhea. The symptoms have been stable. Her past medical history is significant for hyperlipidemia. There is no history of heart failure.  Depression         This is a chronic problem.  The current episode started more than 1 year  ago.   The onset quality is gradual.   The problem occurs intermittently.  The problem has been waxing and waning since onset.  Associated symptoms include fatigue, insomnia, restlessness, decreased interest and sad.  Associated symptoms include no helplessness and no hopelessness.  Past treatments include SNRIs - Serotonin and norepinephrine reuptake inhibitors.  Past medical history includes thyroid problem and anxiety.   Anxiety  Presents for follow-up visit. Symptoms include depressed mood, excessive worry, insomnia, irritability, nervous/anxious behavior, restlessness and shortness of breath. Symptoms occur most days. The severity of symptoms is moderate.    Cough  This is a chronic problem. The current episode started more than 1 year ago. The problem has been waxing and waning. The problem occurs constantly. The cough is non-productive. Associated symptoms include nasal congestion, shortness of breath and wheezing. Pertinent negatives include no chills, fever or heartburn. The symptoms are aggravated by pollens. She has tried rest for the symptoms.  Back Pain  This is a chronic problem. The current episode started more than 1 year ago. The problem occurs intermittently. The problem has been waxing and waning since onset. The pain is present in the thoracic spine. The quality of the pain is described as aching. The pain is at a severity of 6/10. The pain is moderate. Pertinent negatives include no fever, leg pain or numbness. She has tried analgesics for the symptoms. The treatment provided mild relief.  Insomnia  Primary symptoms: difficulty falling asleep, frequent awakening, malaise/fatigue.  The current episode started more than one year. The onset quality is gradual. The problem has been waxing and waning since onset. PMH includes: depression.  COPD PT currently taking Trelegy. Complaining of nonproductive cough and SOB. She is  followed by Pulmonologist.        Review of Systems    Constitutional: Positive for fatigue, irritability and malaise/fatigue. Negative for chills and fever.  HENT: Positive for hoarse voice.   Respiratory: Positive for cough, shortness of breath and wheezing.   Gastrointestinal: Negative for diarrhea and heartburn.  Musculoskeletal: Positive for back pain.  Neurological: Negative for numbness.  Psychiatric/Behavioral: Positive for depression. The patient is nervous/anxious and has insomnia.   All other systems reviewed and are negative.      Objective:   Physical Exam  Constitutional: She is oriented to person, place, and time. She appears well-developed and well-nourished. No distress.  HENT:  Head: Normocephalic and atraumatic.  Right Ear: External ear normal.  Mouth/Throat: Oropharynx is clear and moist.  Eyes: Pupils are equal, round, and reactive to light.  Neck: Normal range of motion. Neck supple. No thyromegaly present.  Cardiovascular: Normal rate, regular rhythm, normal heart sounds and intact distal pulses.  No murmur heard. Pulmonary/Chest: Effort normal and breath sounds normal. No respiratory distress. She has no wheezes.  Intermittent nonproductive cough  Abdominal: Soft. Bowel sounds are normal. She exhibits no distension. There is no tenderness.  Musculoskeletal: Normal range of motion. She exhibits no edema or tenderness.  Neurological: She is alert and oriented to person, place, and time. She has normal reflexes. No cranial nerve deficit.  Skin: Skin is warm and dry.  Psychiatric: She has a normal mood and affect. Her behavior is normal. Judgment and thought content normal.  Vitals reviewed.     BP 133/74   Pulse (!) 102   Temp 99.5 F (37.5 C) (Oral)   Ht _0  (1.549 m)   Wt 118 lb 12.8 oz (53.9 kg)   BMI 22.45 kg/m      Assessment & Plan:  Stacy Dalton was seen today for copd, hyperlipidemia and hypothyroidism.  Diagnoses and all orders for this visit:  Essential hypertension -      CMP14+EGFR  Chronic obstructive pulmonary disease with acute exacerbation (HCC) -     CMP14+EGFR -     HYDROcodone-homatropine (HYCODAN) 5-1.5 MG/5ML syrup; Take 5 mLs by mouth every 8 (eight) hours as needed.  Hypothyroidism, unspecified type -     CMP14+EGFR -     TSH  Chronic bilateral thoracic back pain -     CMP14+EGFR  Hyperlipidemia, unspecified hyperlipidemia type -     CMP14+EGFR  Gastroesophageal reflux disease without esophagitis -     CMP14+EGFR  Moderate episode of recurrent major depressive disorder ([place]) -     GBM18+MQTT  Uncomplicated opioid dependence ([place]) -     CMP14+EGFR  Anxiety -     CMP14+EGFR  Vitamin D deficiency disease -     CMP14+EGFR  Insomnia, unspecified type -     CMP14+EGFR   Labs pending Continue medications and keep appts with Pulmonologists  Pt does not want TDAP today RTO in 3 months   Evelina Dun, FNP

## 2017-11-01 NOTE — Patient Instructions (Signed)
Cough, Adult  Coughing is a reflex that clears your throat and your airways. Coughing helps to heal and protect your lungs. It is normal to cough occasionally, but a cough that happens with other symptoms or lasts a long time may be a sign of a condition that needs treatment. A cough may last only 2-3 weeks (acute), or it may last longer than 8 weeks (chronic).  What are the causes?  Coughing is commonly caused by:   Breathing in substances that irritate your lungs.   A viral or bacterial respiratory infection.   Allergies.   Asthma.   Postnasal drip.   Smoking.   Acid backing up from the stomach into the esophagus (gastroesophageal reflux).   Certain medicines.   Chronic lung problems, including COPD (or rarely, lung cancer).   Other medical conditions such as heart failure.    Follow these instructions at home:  Pay attention to any changes in your symptoms. Take these actions to help with your discomfort:   Take medicines only as told by your health care provider.  ? If you were prescribed an antibiotic medicine, take it as told by your health care provider. Do not stop taking the antibiotic even if you start to feel better.  ? Talk with your health care provider before you take a cough suppressant medicine.   Drink enough fluid to keep your urine clear or pale yellow.   If the air is dry, use a cold steam vaporizer or humidifier in your bedroom or your home to help loosen secretions.   Avoid anything that causes you to cough at work or at home.   If your cough is worse at night, try sleeping in a semi-upright position.   Avoid cigarette smoke. If you smoke, quit smoking. If you need help quitting, ask your health care provider.   Avoid caffeine.   Avoid alcohol.   Rest as needed.    Contact a health care provider if:   You have new symptoms.   You cough up pus.   Your cough does not get better after 2-3 weeks, or your cough gets worse.   You cannot control your cough with suppressant  medicines and you are losing sleep.   You develop pain that is getting worse or pain that is not controlled with pain medicines.   You have a fever.   You have unexplained weight loss.   You have night sweats.  Get help right away if:   You cough up blood.   You have difficulty breathing.   Your heartbeat is very fast.  This information is not intended to replace advice given to you by your health care provider. Make sure you discuss any questions you have with your health care provider.  Document Released: 12/16/2010 Document Revised: 11/25/2015 Document Reviewed: 08/26/2014  Elsevier Interactive Patient Education  2018 [employer]

## 2017-11-02 ENCOUNTER — Other Ambulatory Visit: Payer: Self-pay | Admitting: Family

## 2017-11-02 LAB — CMP14+EGFR
ALT: 13 IU/L (ref 0–32)
AST: 22 IU/L (ref 0–40)
Albumin/Globulin Ratio: 1.9 (ref 1.2–2.2)
Albumin: 4.8 g/dL (ref 3.6–4.8)
Alkaline Phosphatase: 75 IU/L (ref 39–117)
BUN/Creatinine Ratio: 10 — ABNORMAL LOW (ref 12–28)
BUN: 14 mg/dL (ref 8–27)
Bilirubin Total: <0.2 mg/dL (ref 0.0–1.2)
CO2: 23 mmol/L (ref 20–29)
Calcium: 9.7 mg/dL (ref 8.7–10.3)
Chloride: 98 mmol/L (ref 96–106)
Creatinine, Ser: 1.38 mg/dL — ABNORMAL HIGH (ref 0.57–1.00)
GFR calc Af Amer: 45 mL/min/{1.73_m2} — ABNORMAL LOW (ref >59)
GFR calc non Af Amer: 39 mL/min/{1.73_m2} — ABNORMAL LOW (ref >59)
Globulin, Total: 2.5 g/dL (ref 1.5–4.5)
Glucose: 107 mg/dL — ABNORMAL HIGH (ref 65–99)
Potassium: 3.1 mmol/L — ABNORMAL LOW (ref 3.5–5.2)
Sodium: 143 mmol/L (ref 134–144)
Total Protein: 7.3 g/dL (ref 6.0–8.5)

## 2017-11-02 LAB — TSH: TSH: 0.433 u[IU]/mL — ABNORMAL LOW (ref 0.450–4.500)

## 2017-11-02 MED ORDER — POTASSIUM CHLORIDE CRYS ER 20 MEQ PO TBCR: 20.0000 meq | EXTENDED_RELEASE_TABLET | Freq: Every day | ORAL | 3 refills | Status: DC

## 2017-11-02 MED ORDER — LEVOTHYROXINE SODIUM 25 MCG PO TABS: 25.0000 ug | ORAL_TABLET | Freq: Every day | ORAL | 1 refills | Status: DC

## 2017-12-07 ENCOUNTER — Ambulatory Visit (INDEPENDENT_AMBULATORY_CARE_PROVIDER_SITE_OTHER): Payer: Medicare Other | Admitting: Family Medicine

## 2017-12-07 ENCOUNTER — Encounter: Payer: Self-pay | Admitting: Family Medicine

## 2017-12-07 VITALS — BP 128/78 | HR 98 | Temp 98.4°F | Ht 61.0 in | Wt 117.0 lb

## 2017-12-07 DIAGNOSIS — J441 Chronic obstructive pulmonary disease with (acute) exacerbation: Secondary | ICD-10-CM | POA: Diagnosis not present

## 2017-12-07 MED ORDER — PREDNISONE 20 MG PO TABS: ORAL_TABLET | ORAL | 0 refills | Status: DC

## 2017-12-07 MED ORDER — HYDROCODONE-HOMATROPINE 5-1.5 MG/5ML PO SYRP: 5.0000 mL | ORAL_SOLUTION | Freq: Three times a day (TID) | ORAL | 0 refills | Status: DC | PRN

## 2017-12-07 NOTE — Progress Notes (Signed)
BP 128/78 (BP Location: Left Arm)   Pulse 98   Temp 98.4 F (36.9 C) (Oral)   Ht 5' 1" (1.549 m)   Wt 117 lb (53.1 kg)   BMI 22.11 kg/m    Subjective:    Patient ID: Stacy Dalton, female    DOB: 1951-05-31, 67 y.o.   MRN: [id number]  HPI: Stacy Dalton is a 67 y.o. female presenting on 12/07/2017 for COPD (cough - needs rf on cough med)   HPI Cough and slight wheezing Patient comes in complaining of increased cough and slight wheezing that starting to come back.  She does know that she has COPD and has been trying to manage her COPD.  She usually takes Hycodan to help control her cough.  She says she was getting better and then it flared up again over the past 1 week.  She denies any significant shortness of breath and only has occasional wheezing.  She says the cough is mostly dry nonproductive and is worse at night than during the day.  She has been using her trilogy inhaler and she feels like it does help her significantly.  She denies any chest pain or fevers or chills.  Relevant past medical, surgical, family and social history reviewed and updated as indicated. Interim medical history since our last visit reviewed. Allergies and medications reviewed and updated.  Review of Systems  Constitutional: Negative for chills and fever.  HENT: Positive for congestion, postnasal drip, rhinorrhea, sinus pressure, sneezing and sore throat. Negative for ear discharge and ear pain.   Eyes: Negative for pain, redness and visual disturbance.  Respiratory: Positive for cough and wheezing. Negative for chest tightness and shortness of breath.   Cardiovascular: Negative for chest pain and leg swelling.  Genitourinary: Negative for difficulty urinating and dysuria.  Musculoskeletal: Negative for back pain and gait problem.  Skin: Negative for rash.  Neurological: Negative for light-headedness and headaches.  Psychiatric/Behavioral: Negative for agitation and behavioral problems.  All  other systems reviewed and are negative.   Per HPI unless specifically indicated above   Allergies as of 12/07/2017      Reactions   Toradol [ketorolac Tromethamine] Shortness Of Breath   Demerol Swelling   Esgic [butalbital-apap-caffeine] Other (See Comments)   jittery   Iodine Other (See Comments)   bp bottomed out per pt several hours later; unsure if pre medicated in past with cm; done in [place]      Medication List        Accurate as of 12/07/17  3:01 PM. Always use your most recent med list.          atorvastatin 40 MG tablet Commonly known as:  LIPITOR TAKE ONE-HALF (1/2) TABLET DAILY (CHANGE IN DIRECTIONS)   AZOR 10-40 MG tablet Generic drug:  amLODipine-olmesartan TAKE 1 TABLET DAILY   BC HEADACHE POWDER PO Take by mouth. Use 1-2 packs daily PRN headache   cetirizine 10 MG tablet Commonly known as:  ZYRTEC Take 1 tablet (10 mg total) by mouth daily.   docusate sodium 100 MG capsule Commonly known as:  COLACE Take 1 capsule (100 mg total) by mouth 2 (two) times daily as needed for mild constipation.   DULoxetine 60 MG capsule Commonly known as:  CYMBALTA Take 1 capsule (60 mg total) by mouth daily.   ferrous sulfate 325 (65 FE) MG tablet Take 325 mg by mouth daily with breakfast.   fluticasone 50 MCG/ACT nasal spray Commonly known as:  FLONASE  Place 2 sprays into both nostrils daily.   Fluticasone-Umeclidin-Vilant 100-62.5-25 MCG/INH Aepb Commonly known as:  TRELEGY ELLIPTA Inhale 1 puff into the lungs daily.   HYDROcodone-homatropine 5-1.5 MG/5ML syrup Commonly known as:  HYCODAN Take 5 mLs by mouth every 8 (eight) hours as needed.   levothyroxine 25 MCG tablet Commonly known as:  SYNTHROID, LEVOTHROID Take 1 tablet (25 mcg total) by mouth daily before breakfast.   ondansetron 4 MG disintegrating tablet Commonly known as:  ZOFRAN-ODT DISSOLVE 1 TABLET(4 MG) ON THE TONGUE EVERY 8 HOURS AS NEEDED   potassium chloride SA 20 MEQ tablet Commonly  known as:  K-DUR,KLOR-CON Take 1 tablet (20 mEq total) by mouth daily.   predniSONE 20 MG tablet Commonly known as:  DELTASONE 2 po at same time daily for 5 days   temazepam 30 MG capsule Commonly known as:  RESTORIL Take 1 capsule (30 mg total) by mouth at bedtime as needed for sleep.   traMADol 50 MG tablet Commonly known as:  ULTRAM Take 1-2 tablets (50-100 mg total) by mouth every 12 (twelve) hours as needed (pain).   triamterene-hydrochlorothiazide 37.5-25 MG capsule Commonly known as:  DYAZIDE TAKE 1 CAPSULE DAILY   VENTOLIN HFA 108 (90 Base) MCG/ACT inhaler Generic drug:  albuterol Inhale into the lungs every 6 (six) hours as needed for wheezing or shortness of breath.   Vitamin D (Ergocalciferol) 50000 units Caps capsule Commonly known as:  DRISDOL TAKE 1 CAPSULE EVERY 7 DAYS          Objective:    BP 128/78 (BP Location: Left Arm)   Pulse 98   Temp 98.4 F (36.9 C) (Oral)   Ht 5' 1" (1.549 m)   Wt 117 lb (53.1 kg)   BMI 22.11 kg/m   Wt Readings from Last 3 Encounters:  12/07/17 117 lb (53.1 kg)  11/01/17 118 lb 12.8 oz (53.9 kg)  10/30/17 122 lb 3.2 oz (55.4 kg)    Physical Exam  Constitutional: She is oriented to person, place, and time. She appears well-developed and well-nourished. No distress.  HENT:  Right Ear: Tympanic membrane, external ear and ear canal normal.  Left Ear: Tympanic membrane, external ear and ear canal normal.  Nose: Mucosal edema and rhinorrhea present. No epistaxis. Right sinus exhibits no maxillary sinus tenderness and no frontal sinus tenderness. Left sinus exhibits no maxillary sinus tenderness and no frontal sinus tenderness.  Mouth/Throat: Uvula is midline and mucous membranes are normal. Posterior oropharyngeal edema and posterior oropharyngeal erythema present. No oropharyngeal exudate or tonsillar abscesses.  Eyes: Conjunctivae are normal.  Cardiovascular: Normal rate, regular rhythm, normal heart sounds and intact distal  pulses.  No murmur heard. Pulmonary/Chest: Effort normal and breath sounds normal. No respiratory distress. She has no wheezes. She has no rales.  Neurological: She is alert and oriented to person, place, and time. Coordination normal.  Skin: Skin is warm and dry. No rash noted. She is not diaphoretic.  Psychiatric: She has a normal mood and affect. Her behavior is normal.  Nursing note and vitals reviewed.   Results for orders placed or performed in visit on 11/01/17  CMP14+EGFR  Result Value Ref Range   Glucose 107 (H) 65 - 99 mg/dL   BUN 14 8 - 27 mg/dL   Creatinine, Ser 1.38 (H) 0.57 - 1.00 mg/dL   GFR calc non Af Amer 39 (L) >59 mL/min/1.73   GFR calc Af Amer 45 (L) >59 mL/min/1.73   BUN/Creatinine Ratio 10 (L) 12 - 28  Sodium 143 134 - 144 mmol/L   Potassium 3.1 (L) 3.5 - 5.2 mmol/L   Chloride 98 96 - 106 mmol/L   CO2 23 20 - 29 mmol/L   Calcium 9.7 8.7 - 10.3 mg/dL   Total Protein 7.3 6.0 - 8.5 g/dL   Albumin 4.8 3.6 - 4.8 g/dL   Globulin, Total 2.5 1.5 - 4.5 g/dL   Albumin/Globulin Ratio 1.9 1.2 - 2.2   Bilirubin Total <0.2 0.0 - 1.2 mg/dL   Alkaline Phosphatase 75 39 - 117 IU/L   AST 22 0 - 40 IU/L   ALT 13 0 - 32 IU/L  TSH  Result Value Ref Range   TSH 0.433 (L) 0.450 - 4.500 uIU/mL      Assessment & Plan:   Problem List Items Addressed This Visit      Respiratory   COPD (chronic obstructive pulmonary disease) (HCC)   Relevant Medications   HYDROcodone-homatropine (HYCODAN) 5-1.5 MG/5ML syrup   predniSONE (DELTASONE) 20 MG tablet    Other Visit Diagnoses    COPD exacerbation ([place])    -  Primary   Relevant Medications   HYDROcodone-homatropine (HYCODAN) 5-1.5 MG/5ML syrup   predniSONE (DELTASONE) 20 MG tablet      Follow up plan: Return if symptoms worsen or fail to improve.  Counseling provided for all of the vaccine components No orders of the defined types were placed in this encounter.   Caryl Pina, MD [facility] 12/07/2017, 3:01 PM

## 2017-12-08 ENCOUNTER — Ambulatory Visit (INDEPENDENT_AMBULATORY_CARE_PROVIDER_SITE_OTHER): Payer: Medicare Other | Admitting: Family Medicine

## 2017-12-08 ENCOUNTER — Encounter: Payer: Self-pay | Admitting: Family Medicine

## 2017-12-08 VITALS — BP 114/66 | HR 85 | Temp 98.5°F | Ht 61.0 in | Wt 121.0 lb

## 2017-12-08 DIAGNOSIS — N3000 Acute cystitis without hematuria: Secondary | ICD-10-CM | POA: Diagnosis not present

## 2017-12-08 DIAGNOSIS — R399 Unspecified symptoms and signs involving the genitourinary system: Secondary | ICD-10-CM | POA: Diagnosis not present

## 2017-12-08 MED ORDER — SULFAMETHOXAZOLE-TRIMETHOPRIM 800-160 MG PO TABS
1.0000 | ORAL_TABLET | Freq: Two times a day (BID) | ORAL | 0 refills | Status: DC
Start: 2017-12-08 — End: 2017-12-14

## 2017-12-08 NOTE — Progress Notes (Signed)
BP 114/66   Pulse 85   Temp 98.5 F (36.9 C) (Oral)   Ht 5\' 1"  (1.549 m)   Wt 121 lb (54.9 kg)   BMI 22.86 kg/m    Subjective:    Patient ID: Stacy Dalton, female    DOB: 1950/09/12, 67 y.o.   MRN: [id number]  HPI: Stacy Dalton is a 67 y.o. female presenting on 12/08/2017 for Burning with urination   HPI Dysuria and burning Patient comes in with complaints of dysuria and burning that is been going on since this morning.  She denies any fevers or chills or flank pain.  She denies any abdominal pain.  She does have a lot of burning and feeling like she has to go very frequently.  She says is been very mild so far but she want to get in before it became worse.  She has been trying to increase her fluid intake.  Relevant past medical, surgical, family and social history reviewed and updated as indicated. Interim medical history since our last visit reviewed. Allergies and medications reviewed and updated.  Review of Systems  Constitutional: Negative for chills and fever.  Eyes: Negative for visual disturbance.  Respiratory: Negative for chest tightness and shortness of breath.   Cardiovascular: Negative for chest pain and leg swelling.  Gastrointestinal: Negative for abdominal pain.  Genitourinary: Positive for dysuria, frequency and urgency. Negative for difficulty urinating, hematuria, vaginal bleeding, vaginal discharge and vaginal pain.  Musculoskeletal: Negative for back pain and gait problem.  Skin: Negative for rash.  Neurological: Negative for light-headedness and headaches.  Psychiatric/Behavioral: Negative for agitation and behavioral problems.  All other systems reviewed and are negative.   Per HPI unless specifically indicated above   Allergies as of 12/08/2017      Reactions   Toradol [ketorolac Tromethamine] Shortness Of Breath   Demerol Swelling   Esgic [butalbital-apap-caffeine] Other (See Comments)   jittery   Iodine Other (See Comments)   bp  bottomed out per pt several hours later; unsure if pre medicated in past with cm; done in [place]      Medication List        Accurate as of 12/08/17 10:21 AM. Always use your most recent med list.          atorvastatin 40 MG tablet Commonly known as:  LIPITOR TAKE ONE-HALF (1/2) TABLET DAILY (CHANGE IN DIRECTIONS)   AZOR 10-40 MG tablet Generic drug:  amLODipine-olmesartan TAKE 1 TABLET DAILY   BC HEADACHE POWDER PO Take by mouth. Use 1-2 packs daily PRN headache   cetirizine 10 MG tablet Commonly known as:  ZYRTEC Take 1 tablet (10 mg total) by mouth daily.   docusate sodium 100 MG capsule Commonly known as:  COLACE Take 1 capsule (100 mg total) by mouth 2 (two) times daily as needed for mild constipation.   DULoxetine 60 MG capsule Commonly known as:  CYMBALTA Take 1 capsule (60 mg total) by mouth daily.   ferrous sulfate 325 (65 FE) MG tablet Take 325 mg by mouth daily with breakfast.   fluticasone 50 MCG/ACT nasal spray Commonly known as:  FLONASE Place 2 sprays into both nostrils daily.   Fluticasone-Umeclidin-Vilant 100-62.5-25 MCG/INH Aepb Commonly known as:  TRELEGY ELLIPTA Inhale 1 puff into the lungs daily.   HYDROcodone-homatropine 5-1.5 MG/5ML syrup Commonly known as:  HYCODAN Take 5 mLs by mouth every 8 (eight) hours as needed.   levothyroxine 25 MCG tablet Commonly known as:  SYNTHROID, LEVOTHROID Take  1 tablet (25 mcg total) by mouth daily before breakfast.   ondansetron 4 MG disintegrating tablet Commonly known as:  ZOFRAN-ODT DISSOLVE 1 TABLET(4 MG) ON THE TONGUE EVERY 8 HOURS AS NEEDED   potassium chloride SA 20 MEQ tablet Commonly known as:  K-DUR,KLOR-CON Take 1 tablet (20 mEq total) by mouth daily.   sulfamethoxazole-trimethoprim 800-160 MG tablet Commonly known as:  BACTRIM DS,SEPTRA DS Take 1 tablet by mouth 2 (two) times daily.   temazepam 30 MG capsule Commonly known as:  RESTORIL Take 1 capsule (30 mg total) by mouth at bedtime  as needed for sleep.   traMADol 50 MG tablet Commonly known as:  ULTRAM Take 1-2 tablets (50-100 mg total) by mouth every 12 (twelve) hours as needed (pain).   triamterene-hydrochlorothiazide 37.5-25 MG capsule Commonly known as:  DYAZIDE TAKE 1 CAPSULE DAILY   VENTOLIN HFA 108 (90 Base) MCG/ACT inhaler Generic drug:  albuterol Inhale into the lungs every 6 (six) hours as needed for wheezing or shortness of breath.   Vitamin D (Ergocalciferol) 50000 units Caps capsule Commonly known as:  DRISDOL TAKE 1 CAPSULE EVERY 7 DAYS          Objective:    BP 114/66   Pulse 85   Temp 98.5 F (36.9 C) (Oral)   Ht 5\' 1"  (1.549 m)   Wt 121 lb (54.9 kg)   BMI 22.86 kg/m   Wt Readings from Last 3 Encounters:  12/08/17 121 lb (54.9 kg)  12/07/17 117 lb (53.1 kg)  11/01/17 118 lb 12.8 oz (53.9 kg)    Physical Exam  Constitutional: She is oriented to person, place, and time. She appears well-developed and well-nourished. No distress.  Eyes: Conjunctivae are normal.  Cardiovascular: Normal rate, regular rhythm, normal heart sounds and intact distal pulses.  No murmur heard. Pulmonary/Chest: Effort normal and breath sounds normal. No respiratory distress. She has no wheezes.  Abdominal: Soft. Bowel sounds are normal. She exhibits no distension and no mass. There is tenderness in the suprapubic area. There is no rigidity, no rebound, no guarding and no CVA tenderness.  Neurological: She is alert and oriented to person, place, and time. Coordination normal.  Skin: Skin is warm and dry. No rash noted. She is not diaphoretic.  Psychiatric: She has a normal mood and affect. Her behavior is normal.  Nursing note and vitals reviewed.   Urinalysis: Dip only: Trace ketones, trace blood, 2+ leukocytes, 1+ protein    Assessment & Plan:   Problem List Items Addressed This Visit    None    Visit Diagnoses    Acute cystitis without hematuria    -  Primary   Relevant Medications    sulfamethoxazole-trimethoprim (BACTRIM DS,SEPTRA DS) 800-160 MG tablet   Other Relevant Orders   Urinalysis       Follow up plan: Return if symptoms worsen or fail to improve.  Counseling provided for all of the vaccine components Orders Placed This Encounter  Procedures  . Urinalysis    Caryl Pina, MD [facility] 12/08/2017, 10:21 AM

## 2017-12-13 LAB — URINALYSIS
Bilirubin, UA: NEGATIVE
Glucose, UA: NEGATIVE
Nitrite, UA: NEGATIVE
Specific Gravity, UA: 1.025 (ref 1.005–1.030)
Urobilinogen, Ur: 0.2 mg/dL (ref 0.2–1.0)
pH, UA: 5.5 (ref 5.0–7.5)

## 2017-12-14 ENCOUNTER — Ambulatory Visit (INDEPENDENT_AMBULATORY_CARE_PROVIDER_SITE_OTHER): Payer: Medicare Other | Admitting: Family

## 2017-12-14 ENCOUNTER — Ambulatory Visit (INDEPENDENT_AMBULATORY_CARE_PROVIDER_SITE_OTHER): Payer: Medicare Other

## 2017-12-14 ENCOUNTER — Encounter: Payer: Self-pay | Admitting: Family

## 2017-12-14 VITALS — BP 131/73 | HR 104 | Temp 97.8°F | Ht 61.0 in | Wt 117.0 lb

## 2017-12-14 DIAGNOSIS — Z0289 Encounter for other administrative examinations: Secondary | ICD-10-CM

## 2017-12-14 DIAGNOSIS — G8929 Other chronic pain: Secondary | ICD-10-CM | POA: Diagnosis not present

## 2017-12-14 DIAGNOSIS — F1120 Opioid dependence, uncomplicated: Secondary | ICD-10-CM | POA: Diagnosis not present

## 2017-12-14 DIAGNOSIS — F331 Major depressive disorder, recurrent, moderate: Secondary | ICD-10-CM | POA: Diagnosis not present

## 2017-12-14 DIAGNOSIS — G4700 Insomnia, unspecified: Secondary | ICD-10-CM | POA: Diagnosis not present

## 2017-12-14 DIAGNOSIS — M546 Pain in thoracic spine: Secondary | ICD-10-CM | POA: Diagnosis not present

## 2017-12-14 DIAGNOSIS — I10 Essential (primary) hypertension: Secondary | ICD-10-CM | POA: Diagnosis not present

## 2017-12-14 DIAGNOSIS — E785 Hyperlipidemia, unspecified: Secondary | ICD-10-CM

## 2017-12-14 DIAGNOSIS — F419 Anxiety disorder, unspecified: Secondary | ICD-10-CM | POA: Diagnosis not present

## 2017-12-14 DIAGNOSIS — G894 Chronic pain syndrome: Secondary | ICD-10-CM

## 2017-12-14 DIAGNOSIS — E039 Hypothyroidism, unspecified: Secondary | ICD-10-CM

## 2017-12-14 DIAGNOSIS — R6889 Other general symptoms and signs: Secondary | ICD-10-CM | POA: Diagnosis not present

## 2017-12-14 DIAGNOSIS — D649 Anemia, unspecified: Secondary | ICD-10-CM

## 2017-12-14 DIAGNOSIS — M47814 Spondylosis without myelopathy or radiculopathy, thoracic region: Secondary | ICD-10-CM | POA: Diagnosis not present

## 2017-12-14 MED ORDER — TEMAZEPAM 30 MG PO CAPS: 30.0000 mg | ORAL_CAPSULE | Freq: Every evening | ORAL | 0 refills | Status: DC | PRN

## 2017-12-14 MED ORDER — TRAMADOL HCL 50 MG PO TABS: 100.0000 mg | ORAL_TABLET | Freq: Three times a day (TID) | ORAL | 2 refills | Status: DC | PRN

## 2017-12-14 NOTE — Patient Instructions (Signed)

## 2017-12-14 NOTE — Progress Notes (Signed)
Subjective:    Patient ID: Stacy Dalton, female    DOB: 27-Sep-1950, 67 y.o.   MRN: [id number]  Chief Complaint  Patient presents with  . pain management    refills    Hypertension  This is a chronic problem. The current episode started more than 1 year ago. The problem has been resolved since onset. The problem is controlled. Associated symptoms include anxiety and malaise/fatigue. Pertinent negatives include no peripheral edema or shortness of breath. Risk factors for coronary artery disease include dyslipidemia and sedentary lifestyle. The current treatment provides moderate improvement. There is no history of kidney disease or heart failure. Identifiable causes of hypertension include a thyroid problem.  Hyperlipidemia  This is a chronic problem. The current episode started more than 1 year ago. The problem is uncontrolled. Recent lipid tests were reviewed and are high. Pertinent negatives include no shortness of breath. Current antihyperlipidemic treatment includes herbal therapy. The current treatment provides mild improvement of lipids. Risk factors for coronary artery disease include dyslipidemia and hypertension.  Back Pain  This is a chronic problem. The current episode started more than 1 year ago. The problem occurs intermittently. The problem has been waxing and waning since onset. The pain is present in the lumbar spine and thoracic spine. The quality of the pain is described as aching. The pain does not radiate. The pain is at a severity of 8/10. The pain is moderate. Pertinent negatives include no bladder incontinence or bowel incontinence. She has tried analgesics, ice and muscle relaxant for the symptoms. The treatment provided mild relief.  Gastroesophageal Reflux  She complains of a hoarse voice. She reports no belching, no choking or no heartburn. This is a chronic problem. The current episode started more than 1 year ago. The problem occurs occasionally. The symptoms are  aggravated by certain foods. Pertinent negatives include no fatigue. She has tried a PPI for the symptoms. The treatment provided moderate relief.  Thyroid Problem  Presents for follow-up visit. Symptoms include anxiety and hoarse voice. Patient reports no constipation, diarrhea or fatigue. The symptoms have been stable. Her past medical history is significant for hyperlipidemia. There is no history of heart failure.  Depression         This is a chronic problem.  The current episode started more than 1 year ago.   The onset quality is gradual.   The problem occurs intermittently.  Associated symptoms include helplessness, hopelessness, insomnia, restlessness and sad.  Associated symptoms include no fatigue.  Past treatments include SNRIs - Serotonin and norepinephrine reuptake inhibitors.  Compliance with treatment is good.  Past medical history includes thyroid problem and anxiety.   Anxiety  Presents for follow-up visit. Symptoms include excessive worry, insomnia, irritability, nervous/anxious behavior and restlessness. Patient reports no shortness of breath. Symptoms occur occasionally. The severity of symptoms is moderate.   Her past medical history is significant for anemia.  Anemia  Presents for follow-up visit. Symptoms include malaise/fatigue. There is no history of heart failure.      Review of Systems  Constitutional: Positive for irritability and malaise/fatigue. Negative for fatigue.  HENT: Positive for hoarse voice.   Respiratory: Negative for choking and shortness of breath.   Gastrointestinal: Negative for bowel incontinence, constipation, diarrhea and heartburn.  Genitourinary: Negative for bladder incontinence.  Musculoskeletal: Positive for back pain.  Psychiatric/Behavioral: Positive for depression. The patient is nervous/anxious and has insomnia.   All other systems reviewed and are negative.      Objective:  Physical Exam  Constitutional: She is oriented to person,  place, and time. She appears well-developed and well-nourished. No distress.  HENT:  Head: Normocephalic and atraumatic.  Right Ear: External ear normal.  Left Ear: External ear normal.  Mouth/Throat: Oropharynx is clear and moist.  Eyes: Pupils are equal, round, and reactive to light.  Neck: Normal range of motion. Neck supple. No thyromegaly present.  Cardiovascular: Normal rate, regular rhythm, normal heart sounds and intact distal pulses.  No murmur heard. Pulmonary/Chest: Effort normal and breath sounds normal. No respiratory distress. She has no wheezes.  Abdominal: Soft. Bowel sounds are normal. She exhibits no distension. There is no tenderness.  Musculoskeletal: Normal range of motion. She exhibits no edema or tenderness.  Pain in thoracic area with extension  Neurological: She is alert and oriented to person, place, and time. She has normal reflexes. No cranial nerve deficit.  Skin: Skin is warm and dry.  Psychiatric: She has a normal mood and affect. Her behavior is normal. Judgment and thought content normal.  Vitals reviewed.     BP 131/73   Pulse (!) 104   Temp 97.8 F (36.6 C) (Oral)   Ht _0  (1.549 m)   Wt 117 lb (53.1 kg)   BMI 22.11 kg/m      Assessment & Plan:  Stacy Dalton comes in today with chief complaint of pain management (refills)   Diagnosis and orders addressed:  1. Essential hypertension - CMP14+EGFR  2. Chronic bilateral thoracic back pain -Will increase Ultram to 100 mg TID from BID. Long discussion that we can not increase any more and I would not change Ultram to Noroc. If she wanted Norco she would need to go to [facility] Pt reviewed in [place] controlled database and has only received controlled medications from our office - [facility] Thoracic Spine 2 View; Future - CMP14+EGFR - traMADol (ULTRAM) 50 MG tablet; Take 2 tablets (100 mg total) by mouth every 8 (eight) hours as needed (pain).  Dispense: 180 tablet; Refill: 2  3.  Hyperlipidemia, unspecified hyperlipidemia type - Lipid panel - CMP14+EGFR  4. Moderate episode of recurrent major depressive disorder (HCC) - CMP14+EGFR  5. Insomnia, unspecified type - CMP14+EGFR - temazepam (RESTORIL) 30 MG capsule; Take 1 capsule (30 mg total) by mouth at bedtime as needed for sleep.  Dispense: 90 capsule; Refill: 0  6. Hypothyroidism, unspecified type - CMP14+EGFR - TSH  7. Uncomplicated opioid dependence (HCC) - CMP14+EGFR - traMADol (ULTRAM) 50 MG tablet; Take 2 tablets (100 mg total) by mouth every 8 (eight) hours as needed (pain).  Dispense: 180 tablet; Refill: 2  8. Anxiety - CMP14+EGFR  9. Normocytic anemia - Anemia Profile B - CMP14+EGFR  10. Pain medication agreement signed - traMADol (ULTRAM) 50 MG tablet; Take 2 tablets (100 mg total) by mouth every 8 (eight) hours as needed (pain).  Dispense: 180 tablet; Refill: 2  11. Chronic pain syndrome - traMADol (ULTRAM) 50 MG tablet; Take 2 tablets (100 mg total) by mouth every 8 (eight) hours as needed (pain).  Dispense: 180 tablet; Refill: 2   Labs pending Health Maintenance reviewed Diet and exercise encouraged  Follow up plan: 3 months    Evelina Dun, FNP

## 2017-12-16 LAB — ANEMIA PROFILE B
Basophils Absolute: 0 10*3/uL (ref 0.0–0.2)
Basos: 0 %
EOS (ABSOLUTE): 0 10*3/uL (ref 0.0–0.4)
Eos: 0 %
Ferritin: 39 ng/mL (ref 15–150)
Folate: 15.7 ng/mL (ref >3.0)
Hematocrit: 44.8 % (ref 34.0–46.6)
Hemoglobin: 15.2 g/dL (ref 11.1–15.9)
Immature Grans (Abs): 0 10*3/uL (ref 0.0–0.1)
Immature Granulocytes: 1 %
Iron Saturation: 26 % (ref 15–55)
Iron: 98 ug/dL (ref 27–139)
Lymphocytes Absolute: 1.4 10*3/uL (ref 0.7–3.1)
Lymphs: 18 %
MCH: 29.3 pg (ref 26.6–33.0)
MCHC: 33.9 g/dL (ref 31.5–35.7)
MCV: 87 fL (ref 79–97)
Monocytes Absolute: 0.7 10*3/uL (ref 0.1–0.9)
Monocytes: 9 %
Neutrophils Absolute: 5.6 10*3/uL (ref 1.4–7.0)
Neutrophils: 72 %
Platelets: 397 10*3/uL (ref 150–450)
RBC: 5.18 x10E6/uL (ref 3.77–5.28)
RDW: 14.3 % (ref 12.3–15.4)
Retic Ct Pct: 0.9 % (ref 0.6–2.6)
Total Iron Binding Capacity: 377 ug/dL (ref 250–450)
UIBC: 279 ug/dL (ref 118–369)
Vitamin B-12: 377 pg/mL (ref 232–1245)
WBC: 7.8 10*3/uL (ref 3.4–10.8)

## 2017-12-16 LAB — CMP14+EGFR
ALT: 12 IU/L (ref 0–32)
AST: 19 IU/L (ref 0–40)
Albumin/Globulin Ratio: 2.4 — ABNORMAL HIGH (ref 1.2–2.2)
Albumin: 5.3 g/dL — ABNORMAL HIGH (ref 3.6–4.8)
Alkaline Phosphatase: 77 IU/L (ref 39–117)
BUN/Creatinine Ratio: 11 — ABNORMAL LOW (ref 12–28)
BUN: 18 mg/dL (ref 8–27)
Bilirubin Total: <0.2 mg/dL (ref 0.0–1.2)
CO2: 21 mmol/L (ref 20–29)
Calcium: 10.6 mg/dL — ABNORMAL HIGH (ref 8.7–10.3)
Chloride: 99 mmol/L (ref 96–106)
Creatinine, Ser: 1.66 mg/dL — ABNORMAL HIGH (ref 0.57–1.00)
GFR calc Af Amer: 36 mL/min/{1.73_m2} — ABNORMAL LOW (ref >59)
GFR calc non Af Amer: 31 mL/min/{1.73_m2} — ABNORMAL LOW (ref >59)
Globulin, Total: 2.2 g/dL (ref 1.5–4.5)
Glucose: 104 mg/dL — ABNORMAL HIGH (ref 65–99)
Potassium: 4.6 mmol/L (ref 3.5–5.2)
Sodium: 142 mmol/L (ref 134–144)
Total Protein: 7.5 g/dL (ref 6.0–8.5)

## 2017-12-16 LAB — LIPID PANEL
Chol/HDL Ratio: 3.3 ratio (ref 0.0–4.4)
Cholesterol, Total: 243 mg/dL — ABNORMAL HIGH (ref 100–199)
HDL: 73 mg/dL (ref >39)
LDL Calculated: 140 mg/dL — ABNORMAL HIGH (ref 0–99)
Triglycerides: 149 mg/dL (ref 0–149)
VLDL Cholesterol Cal: 30 mg/dL (ref 5–40)

## 2017-12-16 LAB — TSH: TSH: 0.596 u[IU]/mL (ref 0.450–4.500)

## 2017-12-17 ENCOUNTER — Other Ambulatory Visit: Payer: Self-pay | Admitting: Family

## 2017-12-17 DIAGNOSIS — R7989 Other specified abnormal findings of blood chemistry: Secondary | ICD-10-CM

## 2017-12-17 MED ORDER — ATORVASTATIN CALCIUM 20 MG PO TABS: 20.0000 mg | ORAL_TABLET | Freq: Every day | ORAL | 11 refills | Status: DC

## 2017-12-24 ENCOUNTER — Other Ambulatory Visit: Payer: Self-pay | Admitting: Family

## 2017-12-24 ENCOUNTER — Telehealth: Payer: Self-pay | Admitting: Family

## 2017-12-24 ENCOUNTER — Ambulatory Visit: Payer: Medicare Other | Admitting: Pulmonary Disease

## 2017-12-24 DIAGNOSIS — M546 Pain in thoracic spine: Principal | ICD-10-CM

## 2017-12-24 DIAGNOSIS — G8929 Other chronic pain: Secondary | ICD-10-CM

## 2017-12-24 NOTE — Telephone Encounter (Signed)
Spoke with patient, she was confused about dosing directions on bottle.  She thought the every 8 hours meant she would only take the Tramadol twice a day and that would mean the dosage had not been increased.  I explained to the patient that every 8 hours would mean she would be taking the Tramadol three times a day and not two.  Patient voiced understanding.

## 2018-01-01 ENCOUNTER — Ambulatory Visit (INDEPENDENT_AMBULATORY_CARE_PROVIDER_SITE_OTHER): Payer: Medicare Other | Admitting: Family

## 2018-01-01 ENCOUNTER — Encounter: Payer: Self-pay | Admitting: Family

## 2018-01-01 VITALS — BP 129/57 | HR 97 | Temp 98.6°F | Ht 61.0 in | Wt 115.0 lb

## 2018-01-01 DIAGNOSIS — G4700 Insomnia, unspecified: Secondary | ICD-10-CM | POA: Diagnosis not present

## 2018-01-01 DIAGNOSIS — G894 Chronic pain syndrome: Secondary | ICD-10-CM | POA: Diagnosis not present

## 2018-01-01 DIAGNOSIS — K219 Gastro-esophageal reflux disease without esophagitis: Secondary | ICD-10-CM

## 2018-01-01 DIAGNOSIS — F1120 Opioid dependence, uncomplicated: Secondary | ICD-10-CM

## 2018-01-01 DIAGNOSIS — I10 Essential (primary) hypertension: Secondary | ICD-10-CM | POA: Diagnosis not present

## 2018-01-01 DIAGNOSIS — F331 Major depressive disorder, recurrent, moderate: Secondary | ICD-10-CM

## 2018-01-01 DIAGNOSIS — Z0289 Encounter for other administrative examinations: Secondary | ICD-10-CM

## 2018-01-01 DIAGNOSIS — G8929 Other chronic pain: Secondary | ICD-10-CM

## 2018-01-01 DIAGNOSIS — R053 Chronic cough: Secondary | ICD-10-CM

## 2018-01-01 DIAGNOSIS — R05 Cough: Secondary | ICD-10-CM

## 2018-01-01 DIAGNOSIS — K5909 Other constipation: Secondary | ICD-10-CM | POA: Diagnosis not present

## 2018-01-01 DIAGNOSIS — M546 Pain in thoracic spine: Secondary | ICD-10-CM | POA: Diagnosis not present

## 2018-01-01 DIAGNOSIS — E785 Hyperlipidemia, unspecified: Secondary | ICD-10-CM

## 2018-01-01 DIAGNOSIS — E039 Hypothyroidism, unspecified: Secondary | ICD-10-CM

## 2018-01-01 DIAGNOSIS — F419 Anxiety disorder, unspecified: Secondary | ICD-10-CM

## 2018-01-01 DIAGNOSIS — J441 Chronic obstructive pulmonary disease with (acute) exacerbation: Secondary | ICD-10-CM

## 2018-01-01 MED ORDER — HYDROCODONE-HOMATROPINE 5-1.5 MG/5ML PO SYRP: 5.0000 mL | ORAL_SOLUTION | Freq: Three times a day (TID) | ORAL | 0 refills | Status: DC | PRN

## 2018-01-01 NOTE — Progress Notes (Signed)
Subjective:    Patient ID: Stacy Dalton, female    DOB: 1950/10/18, 67 y.o.   MRN: [id number]  Chief Complaint  Patient presents with  . Hypothyroidism    three month recheck  . Hypertension   Pt presents to the office today for chronic follow up. PT has pulmonologist's follow up with next week for COPD and chronic cough.  Hypertension  This is a chronic problem. The current episode started more than 1 year ago. The problem has been resolved since onset. The problem is controlled. Associated symptoms include anxiety, malaise/fatigue and shortness of breath. Pertinent negatives include no peripheral edema. Risk factors for coronary artery disease include dyslipidemia and sedentary lifestyle. The current treatment provides moderate improvement. There is no history of kidney disease, CAD/MI or heart failure. Identifiable causes of hypertension include a thyroid problem.  Hyperlipidemia  This is a chronic problem. The current episode started more than 1 year ago. The problem is controlled. Recent lipid tests were reviewed and are normal. Associated symptoms include shortness of breath. Pertinent negatives include no leg pain. Current antihyperlipidemic treatment includes statins. The current treatment provides mild improvement of lipids. Risk factors for coronary artery disease include dyslipidemia, hypertension and a sedentary lifestyle.  Insomnia  Primary symptoms: difficulty falling asleep, frequent awakening, malaise/fatigue.  The current episode started more than one year. The problem occurs intermittently. The problem has been waxing and waning since onset. Past treatments include medication. The treatment provided mild relief. PMH includes: depression.  Anxiety  Presents for follow-up visit. Symptoms include decreased concentration, excessive worry, insomnia, irritability, nervous/anxious behavior, restlessness and shortness of breath. Symptoms occur most days. The severity of symptoms  is mild.   Her past medical history is significant for anemia.  Depression         This is a chronic problem.  The current episode started more than 1 year ago.   The onset quality is sudden.   The problem occurs intermittently.  The problem has been waxing and waning since onset.  Associated symptoms include decreased concentration, helplessness, hopelessness, insomnia, restlessness and sad.  Past treatments include SNRIs - Serotonin and norepinephrine reuptake inhibitors.  Past medical history includes thyroid problem and anxiety.   Back Pain  This is a chronic problem. The current episode started more than 1 year ago. The problem occurs intermittently. The problem has been waxing and waning since onset. The pain is present in the lumbar spine. The quality of the pain is described as aching. The pain is at a severity of 7/10. The pain is moderate. The symptoms are aggravated by coughing and standing. Pertinent negatives include no bladder incontinence, bowel incontinence, dysuria or leg pain. She has tried bed rest, analgesics and NSAIDs for the symptoms.  Constipation  This is a chronic problem. The current episode started more than 1 year ago. The problem has been resolved since onset. Her stool frequency is 1 time per day. Associated symptoms include back pain. She has tried diet changes and laxatives for the symptoms. The treatment provided moderate relief.  Gastroesophageal Reflux  She complains of coughing and a hoarse voice. She reports no heartburn. This is a chronic problem. The current episode started more than 1 year ago. The problem occurs occasionally. She has tried a PPI for the symptoms. The treatment provided moderate relief.  Anemia  Presents for follow-up visit. Symptoms include malaise/fatigue. There has been no bruising/bleeding easily. There is no history of heart failure.  Thyroid Problem  Presents for  follow-up visit. Symptoms include anxiety, constipation, dry skin and hoarse  voice. The symptoms have been stable. Her past medical history is significant for hyperlipidemia. There is no history of heart failure.      Review of Systems  Constitutional: Positive for irritability and malaise/fatigue.  HENT: Positive for hoarse voice.   Respiratory: Positive for cough and shortness of breath.   Gastrointestinal: Positive for constipation. Negative for bowel incontinence and heartburn.  Genitourinary: Negative for bladder incontinence and dysuria.  Musculoskeletal: Positive for back pain.  Hematological: Does not bruise/bleed easily.  Psychiatric/Behavioral: Positive for decreased concentration and depression. The patient is nervous/anxious and has insomnia.   All other systems reviewed and are negative.      Objective:   Physical Exam  Constitutional: She is oriented to person, place, and time. She appears well-developed and well-nourished. No distress.  HENT:  Head: Normocephalic and atraumatic.  Right Ear: External ear normal.  Left Ear: External ear normal.  Mouth/Throat: Oropharynx is clear and moist.  Eyes: Pupils are equal, round, and reactive to light.  Neck: Normal range of motion. Neck supple. No thyromegaly present.  Cardiovascular: Normal rate, regular rhythm, normal heart sounds and intact distal pulses.  No murmur heard. Pulmonary/Chest: Effort normal and breath sounds normal. No respiratory distress. She has no wheezes.  Abdominal: Soft. Bowel sounds are normal. She exhibits no distension. There is no tenderness.  Musculoskeletal: Normal range of motion. She exhibits no edema or tenderness.  Neurological: She is alert and oriented to person, place, and time. She has normal reflexes. No cranial nerve deficit.  Skin: Skin is warm and dry.  Psychiatric: She has a normal mood and affect. Her behavior is normal. Judgment and thought content normal.  Vitals reviewed.    BP (!) 129/57   Pulse 97   Temp 98.6 F (37 C) (Oral)   Ht 5' 1" (1.549 m)    Wt 115 lb (52.2 kg)   BMI 21.73 kg/m       Assessment & Plan:  Stacy Dalton comes in today with chief complaint of Hypothyroidism (three month recheck) and Hypertension   Diagnosis and orders addressed:  1. Essential hypertension - CMP14+EGFR  2. Chronic bilateral thoracic back pain - CMP14+EGFR  3. Insomnia, unspecified type - CMP14+EGFR  4. Moderate episode of recurrent major depressive disorder (HCC) - CMP14+EGFR  5. Hyperlipidemia, unspecified hyperlipidemia type - CMP14+EGFR  6. Chronic constipation - CMP14+EGFR  7. Chronic pain syndrome - CMP14+EGFR  8. Hypothyroidism, unspecified type - CMP14+EGFR  9. Chronic obstructive pulmonary disease with acute exacerbation (HCC) - CMP14+EGFR - HYDROcodone-homatropine (HYCODAN) 5-1.5 MG/5ML syrup; Take 5 mLs by mouth every 8 (eight) hours as needed for cough.  Dispense: 473 mL; Refill: 0  10. Uncomplicated opioid dependence ([place]) - CMP14+EGFR  11. Pain medication agreement signed - CMP14+EGFR  12. Anxiety - CMP14+EGFR  13. Gastroesophageal reflux disease without esophagitis - CMP14+EGFR  14. Chronic cough - CMP14+EGFR - HYDROcodone-homatropine (HYCODAN) 5-1.5 MG/5ML syrup; Take 5 mLs by mouth every 8 (eight) hours as needed for cough.  Dispense: 473 mL; Refill: 0   Labs pending Health Maintenance reviewed Diet and exercise encouraged  Follow up plan: 3 months    Evelina Dun, FNP

## 2018-01-01 NOTE — Patient Instructions (Signed)
Cough, Adult  Coughing is a reflex that clears your throat and your airways. Coughing helps to heal and protect your lungs. It is normal to cough occasionally, but a cough that happens with other symptoms or lasts a long time may be a sign of a condition that needs treatment. A cough may last only 2-3 weeks (acute), or it may last longer than 8 weeks (chronic).  What are the causes?  Coughing is commonly caused by:   Breathing in substances that irritate your lungs.   A viral or bacterial respiratory infection.   Allergies.   Asthma.   Postnasal drip.   Smoking.   Acid backing up from the stomach into the esophagus (gastroesophageal reflux).   Certain medicines.   Chronic lung problems, including COPD (or rarely, lung cancer).   Other medical conditions such as heart failure.    Follow these instructions at home:  Pay attention to any changes in your symptoms. Take these actions to help with your discomfort:   Take medicines only as told by your health care provider.  ? If you were prescribed an antibiotic medicine, take it as told by your health care provider. Do not stop taking the antibiotic even if you start to feel better.  ? Talk with your health care provider before you take a cough suppressant medicine.   Drink enough fluid to keep your urine clear or pale yellow.   If the air is dry, use a cold steam vaporizer or humidifier in your bedroom or your home to help loosen secretions.   Avoid anything that causes you to cough at work or at home.   If your cough is worse at night, try sleeping in a semi-upright position.   Avoid cigarette smoke. If you smoke, quit smoking. If you need help quitting, ask your health care provider.   Avoid caffeine.   Avoid alcohol.   Rest as needed.    Contact a health care provider if:   You have new symptoms.   You cough up pus.   Your cough does not get better after 2-3 weeks, or your cough gets worse.   You cannot control your cough with suppressant  medicines and you are losing sleep.   You develop pain that is getting worse or pain that is not controlled with pain medicines.   You have a fever.   You have unexplained weight loss.   You have night sweats.  Get help right away if:   You cough up blood.   You have difficulty breathing.   Your heartbeat is very fast.  This information is not intended to replace advice given to you by your health care provider. Make sure you discuss any questions you have with your health care provider.  Document Released: 12/16/2010 Document Revised: 11/25/2015 Document Reviewed: 08/26/2014  Elsevier Interactive Patient Education  2018 [employer]

## 2018-01-02 LAB — CMP14+EGFR
ALT: 13 IU/L (ref 0–32)
AST: 22 IU/L (ref 0–40)
Albumin/Globulin Ratio: 2.1 (ref 1.2–2.2)
Albumin: 4.8 g/dL (ref 3.6–4.8)
Alkaline Phosphatase: 75 IU/L (ref 39–117)
BUN/Creatinine Ratio: 11 — ABNORMAL LOW (ref 12–28)
BUN: 24 mg/dL (ref 8–27)
Bilirubin Total: 0.3 mg/dL (ref 0.0–1.2)
CO2: 25 mmol/L (ref 20–29)
Calcium: 10.3 mg/dL (ref 8.7–10.3)
Chloride: 94 mmol/L — ABNORMAL LOW (ref 96–106)
Creatinine, Ser: 2.21 mg/dL — ABNORMAL HIGH (ref 0.57–1.00)
GFR calc Af Amer: 25 mL/min/{1.73_m2} — ABNORMAL LOW (ref >59)
GFR calc non Af Amer: 22 mL/min/{1.73_m2} — ABNORMAL LOW (ref >59)
Globulin, Total: 2.3 g/dL (ref 1.5–4.5)
Glucose: 109 mg/dL — ABNORMAL HIGH (ref 65–99)
Potassium: 3.5 mmol/L (ref 3.5–5.2)
Sodium: 139 mmol/L (ref 134–144)
Total Protein: 7.1 g/dL (ref 6.0–8.5)

## 2018-01-04 ENCOUNTER — Other Ambulatory Visit: Payer: Self-pay | Admitting: Family

## 2018-01-04 DIAGNOSIS — R7989 Other specified abnormal findings of blood chemistry: Secondary | ICD-10-CM

## 2018-01-05 ENCOUNTER — Telehealth: Payer: Self-pay | Admitting: Family

## 2018-01-07 NOTE — Telephone Encounter (Signed)
Aware. She should refrain from taking BC powders. The use of them could cause ulcers.

## 2018-01-10 ENCOUNTER — Ambulatory Visit: Payer: Medicare Other | Admitting: Pulmonary Disease

## 2018-01-21 ENCOUNTER — Encounter: Payer: Self-pay | Admitting: Family Medicine

## 2018-01-21 ENCOUNTER — Ambulatory Visit: Payer: Medicare Other | Admitting: Pediatrics

## 2018-01-21 ENCOUNTER — Other Ambulatory Visit: Payer: Self-pay | Admitting: Family

## 2018-01-21 ENCOUNTER — Ambulatory Visit (INDEPENDENT_AMBULATORY_CARE_PROVIDER_SITE_OTHER): Payer: Medicare Other | Admitting: Family Medicine

## 2018-01-21 VITALS — BP 124/65 | HR 102 | Temp 98.6°F | Ht 61.0 in | Wt 116.5 lb

## 2018-01-21 DIAGNOSIS — G43911 Migraine, unspecified, intractable, with status migrainosus: Secondary | ICD-10-CM | POA: Diagnosis not present

## 2018-01-21 DIAGNOSIS — R300 Dysuria: Secondary | ICD-10-CM | POA: Diagnosis not present

## 2018-01-21 DIAGNOSIS — N3090 Cystitis, unspecified without hematuria: Secondary | ICD-10-CM

## 2018-01-21 LAB — URINALYSIS
Bilirubin, UA: NEGATIVE
Glucose, UA: NEGATIVE
Ketones, UA: NEGATIVE
Nitrite, UA: NEGATIVE
Protein, UA: NEGATIVE
RBC, UA: NEGATIVE
Specific Gravity, UA: <1.005 — ABNORMAL LOW (ref 1.005–1.030)
Urobilinogen, Ur: 0.2 mg/dL (ref 0.2–1.0)
pH, UA: 5.5 (ref 5.0–7.5)

## 2018-01-21 MED ORDER — SUMATRIPTAN SUCCINATE 50 MG PO TABS: ORAL_TABLET | ORAL | 0 refills | Status: DC

## 2018-01-21 MED ORDER — NITROFURANTOIN MONOHYD MACRO 100 MG PO CAPS
100.0000 mg | ORAL_CAPSULE | Freq: Two times a day (BID) | ORAL | 0 refills | Status: DC
Start: 2018-01-21 — End: 2018-03-07

## 2018-01-21 NOTE — Progress Notes (Signed)
Chief Complaint  Patient presents with  . Headache    pt here today c/o headache and dysuria    HPI  Patient presents today for recurrence of migraines.  She has a long-term history of migraines that seem to quit a long time ago but have recently started back.  She relates this back to the death of her spouse and being alone for the first time in her life.  Last night she got into an argument with her daughter and was called bitch by the daughter.  She woke up during the night with her head splitting.  She is very concerned about the welfare of her grandchildren, the children of this daughter.  She is helping to raise them.  The headache pain is 7/10 it is all over her scalp.  It is a throbbing.  She was here 2 weeks ago for urinary symptoms.  She was given a prescription for a sulfa antibiotic and does not feel like that ever completely took care of the problem she still has just a low-grade burning and sense of urgency and pressure.  PMH: Smoking status noted ROS: Per HPI  Objective: BP 124/65   Pulse (!) 102   Temp 98.6 F (37 C) (Oral)   Ht 5\' 1"  (1.549 m)   Wt 116 lb 8 oz (52.8 kg)   BMI 22.01 kg/m  Gen: NAD, alert, cooperative with exam HEENT: NCAT, EOMI, PERRL CV: RRR, good S1/S2, no murmur Resp: CTABL, no wheezes, non-labored Abd: SNTND, BS present, no guarding or organomegaly Ext: No edema, warm Neuro: Alert and oriented, No gross deficits  Assessment and plan:  1. Intractable migraine with status migrainosus, unspecified migraine type   2. Cystitis     No orders of the defined types were placed in this encounter.   Orders Placed This Encounter  Procedures  . Urine Culture  . Urinalysis    Follow up as needed.  Claretta Fraise, MD

## 2018-01-22 ENCOUNTER — Encounter: Payer: Self-pay | Admitting: Family

## 2018-01-22 LAB — URINE CULTURE: Organism ID, Bacteria: NO GROWTH

## 2018-01-30 ENCOUNTER — Other Ambulatory Visit: Payer: Self-pay | Admitting: Family

## 2018-01-30 DIAGNOSIS — G8929 Other chronic pain: Secondary | ICD-10-CM

## 2018-01-30 DIAGNOSIS — M546 Pain in thoracic spine: Principal | ICD-10-CM

## 2018-02-05 ENCOUNTER — Telehealth: Payer: Self-pay | Admitting: Family

## 2018-02-05 NOTE — Telephone Encounter (Signed)
Wanting a refill on her cough medicine. She had to cancel her pulmonology appointment because there was several family members who died in [place] and one here.   Please advise.

## 2018-02-07 MED ORDER — HYDROCODONE-HOMATROPINE 5-1.5 MG/5ML PO SYRP: 5.0000 mL | ORAL_SOLUTION | Freq: Three times a day (TID) | ORAL | 0 refills | Status: DC | PRN

## 2018-03-05 ENCOUNTER — Other Ambulatory Visit: Payer: Self-pay | Admitting: Family

## 2018-03-05 DIAGNOSIS — F1120 Opioid dependence, uncomplicated: Secondary | ICD-10-CM

## 2018-03-05 DIAGNOSIS — G8929 Other chronic pain: Secondary | ICD-10-CM

## 2018-03-05 DIAGNOSIS — G894 Chronic pain syndrome: Secondary | ICD-10-CM

## 2018-03-05 DIAGNOSIS — M546 Pain in thoracic spine: Secondary | ICD-10-CM

## 2018-03-05 DIAGNOSIS — Z0289 Encounter for other administrative examinations: Secondary | ICD-10-CM

## 2018-03-07 ENCOUNTER — Encounter: Payer: Self-pay | Admitting: Family

## 2018-03-07 ENCOUNTER — Ambulatory Visit (INDEPENDENT_AMBULATORY_CARE_PROVIDER_SITE_OTHER): Payer: Medicare Other | Admitting: Family

## 2018-03-07 VITALS — BP 138/74 | HR 99 | Temp 97.4°F | Ht 61.0 in | Wt 116.0 lb

## 2018-03-07 DIAGNOSIS — F1120 Opioid dependence, uncomplicated: Secondary | ICD-10-CM | POA: Diagnosis not present

## 2018-03-07 DIAGNOSIS — Z0289 Encounter for other administrative examinations: Secondary | ICD-10-CM

## 2018-03-07 DIAGNOSIS — M546 Pain in thoracic spine: Secondary | ICD-10-CM | POA: Diagnosis not present

## 2018-03-07 DIAGNOSIS — R7989 Other specified abnormal findings of blood chemistry: Secondary | ICD-10-CM

## 2018-03-07 DIAGNOSIS — M1711 Unilateral primary osteoarthritis, right knee: Secondary | ICD-10-CM | POA: Diagnosis not present

## 2018-03-07 DIAGNOSIS — G8929 Other chronic pain: Secondary | ICD-10-CM

## 2018-03-07 DIAGNOSIS — G894 Chronic pain syndrome: Secondary | ICD-10-CM

## 2018-03-07 LAB — BMP8+EGFR
BUN/Creatinine Ratio: 17 (ref 12–28)
BUN: 17 mg/dL (ref 8–27)
CO2: 20 mmol/L (ref 20–29)
Calcium: 10 mg/dL (ref 8.7–10.3)
Chloride: 105 mmol/L (ref 96–106)
Creatinine, Ser: 1.03 mg/dL — ABNORMAL HIGH (ref 0.57–1.00)
GFR calc Af Amer: 64 mL/min/{1.73_m2} (ref >59)
GFR calc non Af Amer: 56 mL/min/{1.73_m2} — ABNORMAL LOW (ref >59)
Glucose: 89 mg/dL (ref 65–99)
Potassium: 3.7 mmol/L (ref 3.5–5.2)
Sodium: 144 mmol/L (ref 134–144)

## 2018-03-07 MED ORDER — TRAMADOL HCL 50 MG PO TABS: 100.0000 mg | ORAL_TABLET | Freq: Three times a day (TID) | ORAL | 2 refills | Status: DC | PRN

## 2018-03-07 MED ORDER — ONDANSETRON 4 MG PO TBDP: 4.0000 mg | ORAL_TABLET | Freq: Three times a day (TID) | ORAL | 0 refills | Status: DC | PRN

## 2018-03-07 MED ORDER — PREDNISONE 10 MG (21) PO TBPK: ORAL_TABLET | ORAL | 0 refills | Status: DC

## 2018-03-07 NOTE — Patient Instructions (Signed)
Osteoarthritis Osteoarthritis is a type of arthritis that affects tissue that covers the ends of bones in joints (cartilage). Cartilage acts as a cushion between the bones and helps them move smoothly. Osteoarthritis results when cartilage in the joints gets worn down. Osteoarthritis is sometimes called "wear and tear" arthritis. Osteoarthritis is the most common form of arthritis. It often occurs in older people. It is a condition that gets worse over time (a progressive condition). Joints that are most often affected by this condition are in:  Fingers.  Toes.  Hips.  Knees.  Spine, including neck and lower back.  What are the causes? This condition is caused by age-related wearing down of cartilage that covers the ends of bones. What increases the risk? The following factors may make you more likely to develop this condition:  Older age.  Being overweight or obese.  Overuse of joints, such as in athletes.  Past injury of a joint.  Past surgery on a joint.  Family history of osteoarthritis.  What are the signs or symptoms? The main symptoms of this condition are pain, swelling, and stiffness in the joint. The joint may lose its shape over time. Small pieces of bone or cartilage may break off and float inside of the joint, which may cause more pain and damage to the joint. Small deposits of bone (osteophytes) may grow on the edges of the joint. Other symptoms may include:  A grating or scraping feeling inside the joint when you move it.  Popping or creaking sounds when you move.  Symptoms may affect one or more joints. Osteoarthritis in a major joint, such as your knee or hip, can make it painful to walk or exercise. If you have osteoarthritis in your hands, you might not be able to grip items, twist your hand, or control small movements of your hands and fingers (fine motor skills). How is this diagnosed? This condition may be diagnosed based on:  Your medical history.  A  physical exam.  Your symptoms.  X-rays of the affected joint(s).  Blood tests to rule out other types of arthritis.  How is this treated? There is no cure for this condition, but treatment can help to control pain and improve joint function. Treatment plans may include:  A prescribed exercise program that allows for rest and joint relief. You may work with a physical therapist.  A weight control plan.  Pain relief techniques, such as: ? Applying heat and cold to the joint. ? Electric pulses delivered to nerve endings under the skin (transcutaneous electrical nerve stimulation, or TENS). ? Massage. ? Certain nutritional supplements.  NSAIDs or prescription medicines to help relieve pain.  Medicine to help relieve pain and inflammation (corticosteroids). This can be given by mouth (orally) or as an injection.  Assistive devices, such as a brace, wrap, splint, specialized glove, or cane.  Surgery, such as: ? An osteotomy. This is done to reposition the bones and relieve pain or to remove loose pieces of bone and cartilage. ? Joint replacement surgery. You may need this surgery if you have very bad (advanced) osteoarthritis.  Follow these instructions at home: Activity  Rest your affected joints as directed by your health care provider.  Do not drive or use heavy machinery while taking prescription pain medicine.  Exercise as directed. Your health care provider or physical therapist may recommend specific types of exercise, such as: ? Strengthening exercises. These are done to strengthen the muscles that support joints that are affected by arthritis.   They can be performed with weights or with exercise bands to add resistance. ? Aerobic activities. These are exercises, such as brisk walking or water aerobics, that get your heart pumping. ? Range-of-motion activities. These keep your joints easy to move. ? Balance and agility exercises. Managing pain, stiffness, and  swelling  If directed, apply heat to the affected area as often as told by your health care provider. Use the heat source that your health care provider recommends, such as a moist heat pack or a heating pad. ? If you have a removable assistive device, remove it as told by your health care provider. ? Place a towel between your skin and the heat source. If your health care provider tells you to keep the assistive device on while you apply heat, place a towel between the assistive device and the heat source. ? Leave the heat on for 20-30 minutes. ? Remove the heat if your skin turns bright red. This is especially important if you are unable to feel pain, heat, or cold. You may have a greater risk of getting burned.  If directed, put ice on the affected joint: ? If you have a removable assistive device, remove it as told by your health care provider. ? Put ice in a plastic bag. ? Place a towel between your skin and the bag. If your health care provider tells you to keep the assistive device on during icing, place a towel between the assistive device and the bag. ? Leave the ice on for 20 minutes, 2-3 times a day. General instructions  Take over-the-counter and prescription medicines only as told by your health care provider.  Maintain a healthy weight. Follow instructions from your health care provider for weight control. These may include dietary restrictions.  Do not use any products that contain nicotine or tobacco, such as cigarettes and e-cigarettes. These can delay bone healing. If you need help quitting, ask your health care provider.  Use assistive devices as directed by your health care provider.  Keep all follow-up visits as told by your health care provider. This is important. Where to find more information:  National Institute of Arthritis and Musculoskeletal and Skin Diseases: www.niams.nih.gov  National Institute on Aging: www.nia.nih.gov  American College of Rheumatology:  www.rheumatology.org Contact a health care provider if:  Your skin turns red.  You develop a rash.  You have pain that gets worse.  You have a fever along with joint or muscle aches. Get help right away if:  You lose a lot of weight.  You suddenly lose your appetite.  You have night sweats. Summary  Osteoarthritis is a type of arthritis that affects tissue covering the ends of bones in joints (cartilage).  This condition is caused by age-related wearing down of cartilage that covers the ends of bones.  The main symptom of this condition is pain, swelling, and stiffness in the joint.  There is no cure for this condition, but treatment can help to control pain and improve joint function. This information is not intended to replace advice given to you by your health care provider. Make sure you discuss any questions you have with your health care provider. Document Released: 06/19/2005 Document Revised: 02/21/2016 Document Reviewed: 02/21/2016 Elsevier Interactive Patient Education  2018 [employer] Inc.  

## 2018-03-07 NOTE — Progress Notes (Signed)
Subjective:    Patient ID: Stacy Dalton, female    DOB: 11-03-1950, 67 y.o.   MRN: [id number]  Chief Complaint  Patient presents with  . Knee Pain    right    Knee Pain   The incident occurred more than 1 week ago. The pain is present in the right knee. The quality of the pain is described as aching. The pain is at a severity of 5/10. The pain is moderate. The pain has been intermittent since onset. She reports no foreign bodies present. The symptoms are aggravated by movement. She has tried acetaminophen, rest and NSAIDs for the symptoms. The treatment provided mild relief.  Back Pain  This is a chronic problem. The current episode started more than 1 year ago. The problem occurs intermittently. The problem has been waxing and waning since onset. The pain is present in the thoracic spine. The quality of the pain is described as aching. The pain is at a severity of 8/10. The pain is moderate. Pertinent negatives include no leg pain, pelvic pain or weakness. She has tried analgesics for the symptoms. The treatment provided mild relief.      Review of Systems  Genitourinary: Negative for pelvic pain.  Musculoskeletal: Positive for arthralgias and back pain.  Neurological: Negative for weakness.  All other systems reviewed and are negative.      Objective:   Physical Exam  Constitutional: She is oriented to person, place, and time. She appears well-developed and well-nourished. No distress.  HENT:  Head: Normocephalic.  Eyes: Pupils are equal, round, and reactive to light.  Neck: Normal range of motion. Neck supple. No thyromegaly present.  Cardiovascular: Normal rate, regular rhythm, normal heart sounds and intact distal pulses.  No murmur heard. Pulmonary/Chest: Effort normal and breath sounds normal. No respiratory distress. She has no wheezes.  Abdominal: Soft. Bowel sounds are normal. She exhibits no distension. There is no tenderness.  Musculoskeletal: She exhibits no  edema or tenderness.  Full ROM of knee, pain in thoracic back with flexion   Neurological: She is alert and oriented to person, place, and time. She has normal reflexes. No cranial nerve deficit.  Skin: Skin is warm and dry.  Psychiatric: She has a normal mood and affect. Her behavior is normal. Judgment and thought content normal.  Vitals reviewed.     BP 138/74   Pulse 99   Temp (!) 97.4 F (36.3 C) (Oral)   Ht 5' 1"  (1.549 m)   Wt 116 lb (52.6 kg)   BMI 21.92 kg/m      Assessment & Plan:  Stacy Dalton comes in today with chief complaint of Knee Pain (right)   Diagnosis and orders addressed:  1. Primary osteoarthritis of right knee - BMP8+EGFR  2. Pain medication agreement signed - BMP8+EGFR - traMADol (ULTRAM) 50 MG tablet; Take 2 tablets (100 mg total) by mouth every 8 (eight) hours as needed (pain).  Dispense: 180 tablet; Refill: 2 - ToxASSURE Select 13 (MW), Urine  3. Uncomplicated opioid dependence (HCC) - BMP8+EGFR - traMADol (ULTRAM) 50 MG tablet; Take 2 tablets (100 mg total) by mouth every 8 (eight) hours as needed (pain).  Dispense: 180 tablet; Refill: 2 - ToxASSURE Select 13 (MW), Urine  4. Chronic bilateral thoracic back pain - BMP8+EGFR - traMADol (ULTRAM) 50 MG tablet; Take 2 tablets (100 mg total) by mouth every 8 (eight) hours as needed (pain).  Dispense: 180 tablet; Refill: 2 - ondansetron (ZOFRAN-ODT) 4 MG disintegrating tablet;  Take 1 tablet (4 mg total) by mouth every 8 (eight) hours as needed for nausea or vomiting.  Dispense: 90 tablet; Refill: 0  5. Chronic pain syndrome - BMP8+EGFR - traMADol (ULTRAM) 50 MG tablet; Take 2 tablets (100 mg total) by mouth every 8 (eight) hours as needed (pain).  Dispense: 180 tablet; Refill: 2  6. Elevated serum creatinine - BMP8+EGFR - ToxASSURE Select 13 (MW), Urine   Labs pending, pt reviewed in [place] controlled database, no red flags  Health Maintenance reviewed Diet and exercise  encouraged  Follow up plan: 3 months    Evelina Dun, FNP

## 2018-03-11 LAB — TOXASSURE SELECT 13 (MW), URINE

## 2018-03-14 ENCOUNTER — Ambulatory Visit (INDEPENDENT_AMBULATORY_CARE_PROVIDER_SITE_OTHER): Payer: Medicare Other | Admitting: Pulmonary Disease

## 2018-03-14 ENCOUNTER — Encounter: Payer: Self-pay | Admitting: Pulmonary Disease

## 2018-03-14 VITALS — BP 138/70 | HR 77 | Ht 59.84 in | Wt 116.4 lb

## 2018-03-14 DIAGNOSIS — J449 Chronic obstructive pulmonary disease, unspecified: Secondary | ICD-10-CM

## 2018-03-14 DIAGNOSIS — R5383 Other fatigue: Secondary | ICD-10-CM | POA: Insufficient documentation

## 2018-03-14 DIAGNOSIS — K219 Gastro-esophageal reflux disease without esophagitis: Secondary | ICD-10-CM

## 2018-03-14 DIAGNOSIS — R05 Cough: Secondary | ICD-10-CM | POA: Diagnosis not present

## 2018-03-14 DIAGNOSIS — R059 Cough, unspecified: Secondary | ICD-10-CM | POA: Insufficient documentation

## 2018-03-14 DIAGNOSIS — R531 Weakness: Secondary | ICD-10-CM | POA: Insufficient documentation

## 2018-03-14 LAB — PULMONARY FUNCTION TEST
DL/VA % pred: 119 %
DL/VA: 5.03 ml/min/mmHg/L
DLCO unc % pred: 123 %
DLCO unc: 23.12 ml/min/mmHg
FEF 25-75 Post: 1.93 L/sec
FEF 25-75 Pre: 1.38 L/sec
FEF2575-%Change-Post: 39 %
FEF2575-%Pred-Post: 115 %
FEF2575-%Pred-Pre: 82 %
FEV1-%Change-Post: 9 %
FEV1-%Pred-Post: 109 %
FEV1-%Pred-Pre: 100 %
FEV1-Post: 2.08 L
FEV1-Pre: 1.91 L
FEV1FVC-%Change-Post: 5 %
FEV1FVC-%Pred-Pre: 96 %
FEV6-%Change-Post: 3 %
FEV6-%Pred-Post: 111 %
FEV6-%Pred-Pre: 107 %
FEV6-Post: 2.67 L
FEV6-Pre: 2.58 L
FEV6FVC-%Change-Post: 0 %
FEV6FVC-%Pred-Post: 104 %
FEV6FVC-%Pred-Pre: 104 %
FVC-%Change-Post: 3 %
FVC-%Pred-Post: 106 %
FVC-%Pred-Pre: 103 %
FVC-Post: 2.69 L
FVC-Pre: 2.6 L
Post FEV1/FVC ratio: 77 %
Post FEV6/FVC ratio: 99 %
Pre FEV1/FVC ratio: 74 %
Pre FEV6/FVC Ratio: 99 %
RV % pred: 160 %
RV: 3.17 L
TLC % pred: 143 %
TLC: 6.35 L

## 2018-03-14 MED ORDER — FLUTICASONE FUROATE-VILANTEROL 100-25 MCG/INH IN AEPB: 1.0000 | INHALATION_SPRAY | Freq: Every day | RESPIRATORY_TRACT | 0 refills | Status: AC

## 2018-03-14 MED ORDER — FAMOTIDINE 20 MG PO TABS: 20.0000 mg | ORAL_TABLET | Freq: Every day | ORAL | 4 refills | Status: DC

## 2018-03-14 NOTE — Assessment & Plan Note (Signed)
[facility]worth[facility] today is 1 We will defer home sleep study at this time

## 2018-03-14 NOTE — Assessment & Plan Note (Signed)
Breo Ellipta 100 >>> Take 1 puff daily in the morning right when you wake up >>>Rinse your mouth out after use >>>This is a daily maintenance inhaler, NOT a rescue inhaler >>>Contact our office if you are having difficulties affording or obtaining this medication >>>It is important for you to be able to take this daily and not miss any doses >>> Contact our office when you are halfway through to let us know how you are doing   Only use your albuterol as a rescue medication to be used if you can't catch your breath by resting or doing a relaxed purse lip breathing pattern.  - The less you use it, the better it will work when you need it. - Ok to use up to 2 puffs  every 4 hours if you must but call for immediate appointment if use goes up over your usual need - Don't leave home without it !!  (think of it like the spare tire for your car)   Pepcid 20mg  in the morning on an empty stomach    Follow-up with our office in 2 to 3 months

## 2018-03-14 NOTE — Patient Instructions (Addendum)
Breo Ellipta 100 >>> Take 1 puff daily in the morning right when you wake up >>>Rinse your mouth out after use >>>This is a daily maintenance inhaler, NOT a rescue inhaler >>>Contact our office if you are having difficulties affording or obtaining this medication >>>It is important for you to be able to take this daily and not miss any doses >>> Contact our office when you are halfway through to let us know how you are doing   Only use your albuterol as a rescue medication to be used if you can't catch your breath by resting or doing a relaxed purse lip breathing pattern.  - The less you use it, the better it will work when you need it. - Ok to use up to 2 puffs  every 4 hours if you must but call for immediate appointment if use goes up over your usual need - Don't leave home without it !!  (think of it like the spare tire for your car)     Epworth's today - 1  Pepcid 20mg  in the morning on an empty stomach    Follow-up with our office in 2 to 3 months  Flu vaccine after you see Trudie Reed       It is flu season:   >>>Remember to be washing your hands regularly, using hand sanitizer, be careful to use around herself with has contact with people who are sick will increase her chances of getting sick yourself. >>> Best ways to protect herself from the flu: Receive the yearly flu vaccine, practice good hand hygiene washing with soap and also using hand sanitizer when available, eat a nutritious meals, get adequate rest, hydrate appropriately   Please contact the office if your symptoms worsen or you have concerns that you are not improving.   Thank you for choosing [facility] for your healthcare, and for allowing [place] to partner with you on your healthcare journey. I am thankful to be able to provide care to you today.   Wyn Quaker FNP-C   Food Choices for Gastroesophageal Reflux Disease, Adult When you have gastroesophageal reflux disease (GERD), the foods you eat and  your eating habits are very important. Choosing the right foods can help ease your discomfort. What guidelines do I need to follow?  Choose fruits, vegetables, whole grains, and low-fat dairy products.  Choose low-fat meat, fish, and poultry.  Limit fats such as oils, salad dressings, butter, nuts, and avocado.  Keep a food diary. This helps you identify foods that cause symptoms.  Avoid foods that cause symptoms. These may be different for everyone.  Eat small meals often instead of 3 large meals a day.  Eat your meals slowly, in a place where you are relaxed.  Limit fried foods.  Cook foods using methods other than frying.  Avoid drinking alcohol.  Avoid drinking large amounts of liquids with your meals.  Avoid bending over or lying down until 2-3 hours after eating. What foods are not recommended? These are some foods and drinks that may make your symptoms worse: Vegetables Tomatoes. Tomato juice. Tomato and spaghetti sauce. Chili peppers. Onion and garlic. Horseradish. Fruits Oranges, grapefruit, and lemon (fruit and juice). Meats High-fat meats, fish, and poultry. This includes hot dogs, ribs, ham, sausage, salami, and bacon. Dairy Whole milk and chocolate milk. Sour cream. Cream. Butter. Ice cream. Cream cheese. Drinks Coffee and tea. Bubbly (carbonated) drinks or energy drinks. Condiments Hot sauce. Barbecue sauce. Sweets/Desserts Chocolate and cocoa. Donuts. Peppermint and spearmint.  Fats and Oils High-fat foods. This includes [place] fries and potato chips. Other Vinegar. Strong spices. This includes black pepper, white pepper, red pepper, cayenne, curry powder, cloves, ginger, and chili powder. The items listed above may not be a complete list of foods and drinks to avoid. Contact your dietitian for more information. This information is not intended to replace advice given to you by your health care provider. Make sure you discuss any questions you have with  your health care provider. Document Released: 12/19/2011 Document Revised: 11/25/2015 Document Reviewed: 04/23/2013 Elsevier Interactive Patient Education  2017 [employer].  Gastroesophageal Reflux Disease, Adult Normally, food travels down the esophagus and stays in the stomach to be digested. If a person has gastroesophageal reflux disease (GERD), food and stomach acid move back up into the esophagus. When this happens, the esophagus becomes sore and swollen (inflamed). Over time, GERD can make small holes (ulcers) in the lining of the esophagus. Follow these instructions at home: Diet  Follow a diet as told by your doctor. You may need to avoid foods and drinks such as: ? Coffee and tea (with or without caffeine). ? Drinks that contain alcohol. ? Energy drinks and sports drinks. ? Carbonated drinks or sodas. ? Chocolate and cocoa. ? Peppermint and mint flavorings. ? Garlic and onions. ? Horseradish. ? Spicy and acidic foods, such as peppers, chili powder, curry powder, vinegar, hot sauces, and BBQ sauce. ? Citrus fruit juices and citrus fruits, such as oranges, lemons, and limes. ? Tomato-based foods, such as red sauce, chili, salsa, and pizza with red sauce. ? Fried and fatty foods, such as donuts, french fries, potato chips, and high-fat dressings. ? High-fat meats, such as hot dogs, rib eye steak, sausage, ham, and bacon. ? High-fat dairy items, such as whole milk, butter, and cream cheese.  Eat small meals often. Avoid eating large meals.  Avoid drinking large amounts of liquid with your meals.  Avoid eating meals during the 2-3 hours before bedtime.  Avoid lying down right after you eat.  Do not exercise right after you eat. General instructions  Pay attention to any changes in your symptoms.  Take over-the-counter and prescription medicines only as told by your doctor. Do not take aspirin, ibuprofen, or other NSAIDs unless your doctor says it is okay.  Do not use  any tobacco products, including cigarettes, chewing tobacco, and e-cigarettes. If you need help quitting, ask your doctor.  Wear loose clothes. Do not wear anything tight around your waist.  Raise (elevate) the head of your bed about 6 inches (15 cm).  Try to lower your stress. If you need help doing this, ask your doctor.  If you are overweight, lose an amount of weight that is healthy for you. Ask your doctor about a safe weight loss goal.  Keep all follow-up visits as told by your doctor. This is important. Contact a doctor if:  You have new symptoms.  You lose weight and you do not know why it is happening.  You have trouble swallowing, or it hurts to swallow.  You have wheezing or a cough that keeps happening.  Your symptoms do not get better with treatment.  You have a hoarse voice. Get help right away if:  You have pain in your arms, neck, jaw, teeth, or back.  You feel sweaty, dizzy, or light-headed.  You have chest pain or shortness of breath.  You throw up (vomit) and your throw up looks like blood or coffee grounds.  You pass out (faint).  Your poop (stool) is bloody or black.  You cannot swallow, drink, or eat. This information is not intended to replace advice given to you by your health care provider. Make sure you discuss any questions you have with your health care provider. Document Released: 12/06/2007 Document Revised: 11/25/2015 Document Reviewed: 10/14/2014 Elsevier Interactive Patient Education  [employer].

## 2018-03-14 NOTE — Assessment & Plan Note (Signed)
Pepcid 20mg  in the morning on an empty stomach   >>> Review GERD literature provided on discharge paperwork  Follow-up with our office in 2 to 3 months

## 2018-03-14 NOTE — Progress Notes (Signed)
@Patient  ID: [name], female    DOB: [birth year], 67 y.o.   MRN: [id number]  Chief Complaint  Patient presents with  . Follow-up    PFT today     Referring provider: [name], FNP  HPI:   67 year old female former smoker initially referred to our office on 10/30/2017 for continued dyspnea, cough and management of COPD  PMH: Hypertension, hyperlipidemia Smoker/ Smoking History: Former smoker.  30 pack years.  Quit in 2000. Maintenance: Trelegy Ellipta Pt of: Dr. Vaughan Browner  Recent [facility] Encounters:   10/30/2017-initial office visit- [place] Patient reports she was diagnosed with COPD in 2016.  Patient has been seen by urgent care several times for cough, COPD exacerbation.  Primary care is also been managing.  Patient was started on trelegy inhaler couple months ago.  Patient's continued complaint is dyspnea with activity and at rest, as well as cough with productive white mucus. Plan: CBC with differential, blood allergy profile, mold antibody panel, alpha-1 antitrypsin phenotype, PFTs, continue trelegy Ellipta and albuterol   03/14/2018  - Visit   67 year old female former smoker presenting today for follow-up with pulmonary function testing.  Pulmonary function testing today showing FVC of 2.6 (103 predicted), postbronchodilator ratio of 77, FEV1 109, mid flow reversibility after bronchodilator, DLCO 123, slight concavity and flow volume loops.  Patient reports that after last office visit she was on trilogy Ellipta but she has since stopped that she did not feel like it was helping.  Patient currently has been using her rescue inhaler daily to help manage breathing.  Patient does endorse fatigue as well as not sleeping as well.  Patient has never been formally tested for sleep apnea. She reports restless legs. Trouble falling asleep initially when sleeping.  Patient does not report taking naps in the day time. She denies sleep walking, sleep talking, bruxism,  or nightmares.  She denies sleep hallucinations, sleep paralysis, or cataplexy.    Tests:  CT abdomen pelvis 11/29/2015-visualized lung bases are clear Chest x-ray 08/23/2017-no acute cardiopulmonary abnormality  CBC 06/21/2017-WBC 7.3, eos 4%, absolute eosinophil count 300  10/30/2017-allergy panel-mold labs are normal 10/30/2017- respiratory allergy profile- not responsive, IgE 11 10/30/17-CBC with differential eosinophils relative 0.1, eosinophils absolute 0  12/19/2016-echocardiogram-LV ejection fraction 60 to 65%, PA peak pressures 33  Chart Review:     Specialty Problems      Pulmonary Problems   COPD GOLD 0 with Asthmatic process    Cough      Allergies  Allergen Reactions  . Toradol [Ketorolac Tromethamine] Shortness Of Breath  . Demerol Swelling  . Esgic [Butalbital-Apap-Caffeine] Other (See Comments)    jittery  . Iodine Other (See Comments)    bp bottomed out per pt several hours later; unsure if pre medicated in past with cm; done in [place]    Immunization History  Administered Date(s) Administered  . Influenza Whole 04/29/2012  . Influenza,inj,Quad PF,6+ Mos 05/05/2013, 06/10/2014, 05/13/2015, 05/05/2016    Past Medical History:  Diagnosis Date  . COPD (chronic obstructive pulmonary disease) ([place])    told has copd, no current inhaler use  . Depression   . GERD (gastroesophageal reflux disease)   . Hypertension   . Hypothyroidism   . Insomnia   . Migraine   . Migraine   . Osteopenia   . Panic attacks     Tobacco History: Social History   Tobacco Use  Smoking Status Former Smoker  . Packs/day: 1.50  . Years: 20.00  .  Pack years: 30.00  . Types: Cigarettes  . Last attempt to quit: 10/04/1998  . Years since quitting: 19.4  Smokeless Tobacco Never Used   Counseling given: Not Answered Continue not smoking.  Outpatient Encounter Medications as of 03/14/2018  Medication Sig  . albuterol (VENTOLIN HFA) 108 (90 Base) MCG/ACT inhaler Inhale into  the lungs every 6 (six) hours as needed for wheezing or shortness of breath.  . AZOR 10-40 MG tablet TAKE 1 TABLET DAILY  . ferrous sulfate 325 (65 FE) MG tablet Take 325 mg by mouth daily with breakfast.  . fluticasone (FLONASE) 50 MCG/ACT nasal spray Place 2 sprays into both nostrils daily.  [name] HYDROcodone-homatropine (HYCODAN) 5-1.5 MG/5ML syrup Take 5 mLs by mouth every 8 (eight) hours as needed for cough.  . levothyroxine (SYNTHROID, LEVOTHROID) 25 MCG tablet Take 1 tablet (25 mcg total) by mouth daily before breakfast.  . ondansetron (ZOFRAN-ODT) 4 MG disintegrating tablet Take 1 tablet (4 mg total) by mouth every 8 (eight) hours as needed for nausea or vomiting.  . potassium chloride SA (K-DUR,KLOR-CON) 20 MEQ tablet Take 1 tablet (20 mEq total) by mouth daily.  . temazepam (RESTORIL) 30 MG capsule Take 1 capsule (30 mg total) by mouth at bedtime as needed for sleep.  . traMADol (ULTRAM) 50 MG tablet Take 2 tablets (100 mg total) by mouth every 8 (eight) hours as needed (pain).  . Vitamin D, Ergocalciferol, (DRISDOL) 50000 units CAPS capsule TAKE 1 CAPSULE EVERY 7 DAYS (Patient taking differently: TAKE 1 CAPSULE (50000 UNITS) BY MOUTH EVERY 7 DAYS)  . famotidine (PEPCID) 20 MG tablet Take 1 tablet (20 mg total) by mouth daily.  . fluticasone furoate-vilanterol (BREO ELLIPTA) 100-25 MCG/INH AEPB Inhale 1 puff into the lungs daily for 1 day.  . Fluticasone-Umeclidin-Vilant (TRELEGY ELLIPTA) 100-62.5-25 MCG/INH AEPB Inhale 1 puff into the lungs daily. (Patient not taking: Reported on 03/14/2018)  . [DISCONTINUED] atorvastatin (LIPITOR) 20 MG tablet Take 1 tablet (20 mg total) by mouth daily.  . [DISCONTINUED] cetirizine (ZYRTEC) 10 MG tablet Take 1 tablet (10 mg total) by mouth daily.  . [DISCONTINUED] DULoxetine (CYMBALTA) 60 MG capsule Take 1 capsule (60 mg total) by mouth daily.  . [DISCONTINUED] predniSONE (STERAPRED UNI-PAK 21 TAB) 10 MG (21) TBPK tablet Use as directed  . [DISCONTINUED]  SUMAtriptan (IMITREX) 50 MG tablet Take at onset of headache. May repeat in 2 hours if headache persists or recurs. Max two per 24 hours   No facility-administered encounter medications on file as of 03/14/2018.     Review of Systems  Review of Systems  Constitutional: Positive for fatigue. Negative for chills, fever and unexpected weight change.  HENT: Negative for congestion, ear pain, postnasal drip, sinus pressure and sinus pain.   Respiratory: Positive for cough and shortness of breath. Negative for chest tightness and wheezing.   Cardiovascular: Negative for chest pain and palpitations.  Gastrointestinal: Negative for blood in stool, diarrhea, nausea and vomiting.  Genitourinary: Negative for dysuria, frequency and urgency.  Musculoskeletal: Negative for arthralgias.  Skin: Negative for color change.  Allergic/Immunologic: Negative for environmental allergies and food allergies.  Neurological: Negative for dizziness, light-headedness and headaches.  Psychiatric/Behavioral: Negative for dysphoric mood. The patient is not nervous/anxious.   All other systems reviewed and are negative.    Physical Exam  BP 138/70 (BP Location: Left Arm, Cuff Size: Normal)   Pulse 77   Ht 4' 11.84" (1.52 m)   Wt 116 lb 6.4 oz (52.8 kg)   SpO2 97%  BMI 22.85 kg/m   Wt Readings from Last 5 Encounters:  03/14/18 116 lb 6.4 oz (52.8 kg)  03/07/18 116 lb (52.6 kg)  01/21/18 116 lb 8 oz (52.8 kg)  01/01/18 115 lb (52.2 kg)  12/14/17 117 lb (53.1 kg)     Physical Exam  Constitutional: She is oriented to person, place, and time and well-developed, well-nourished, and in no distress. Vital signs are normal. No distress.  HENT:  Head: Normocephalic and atraumatic.  Right Ear: Hearing, tympanic membrane, external ear and ear canal normal.  Left Ear: Hearing, tympanic membrane, external ear and ear canal normal.  Nose: Mucosal edema present. Right sinus exhibits no maxillary sinus tenderness and  no frontal sinus tenderness. Left sinus exhibits no maxillary sinus tenderness and no frontal sinus tenderness.  Mouth/Throat: Uvula is midline and oropharynx is clear and moist. No oropharyngeal exudate.  Eyes: Pupils are equal, round, and reactive to light.  Neck: Normal range of motion. Neck supple. No JVD present.  Cardiovascular: Normal rate, regular rhythm and normal heart sounds.  Pulmonary/Chest: Effort normal and breath sounds normal. No accessory muscle usage. No respiratory distress. She has no decreased breath sounds. She has no wheezes. She has no rhonchi.  Abdominal: Soft. Bowel sounds are normal. There is no tenderness.  Musculoskeletal: Normal range of motion. She exhibits no edema.  Lymphadenopathy:    She has no cervical adenopathy.  Neurological: She is alert and oriented to person, place, and time. Gait normal.  Skin: Skin is warm and dry. She is not diaphoretic. No erythema.  Psychiatric: Mood, memory, affect and judgment normal.  Nursing note and vitals reviewed.     Lab Results:  CBC    Component Value Date/Time   WBC 7.8 12/14/2017 1116   WBC 9.3 10/30/2017 1627   RBC 5.18 12/14/2017 1116   RBC 4.73 10/30/2017 1627   HGB 15.2 12/14/2017 1116   HCT 44.8 12/14/2017 1116   PLT 397 12/14/2017 1116   MCV 87 12/14/2017 1116   MCH 29.3 12/14/2017 1116   MCH 25.1 (L) 02/23/2017 0042   MCHC 33.9 12/14/2017 1116   MCHC 33.7 10/30/2017 1627   RDW 14.3 12/14/2017 1116   LYMPHSABS 1.4 12/14/2017 1116   MONOABS 0.5 10/30/2017 1627   EOSABS 0.0 12/14/2017 1116   BASOSABS 0.0 12/14/2017 1116    BMET    Component Value Date/Time   NA 144 03/07/2018 0947   K 3.7 03/07/2018 0947   CL 105 03/07/2018 0947   CO2 20 03/07/2018 0947   GLUCOSE 89 03/07/2018 0947   GLUCOSE 111 (H) 02/24/2017 0640   BUN 17 03/07/2018 0947   CREATININE 1.03 (H) 03/07/2018 0947   CREATININE 0.79 11/06/2012 0851   CALCIUM 10.0 03/07/2018 0947   GFRNONAA 56 (L) 03/07/2018 0947   GFRAA  64 03/07/2018 0947    BNP No results found for: BNP  ProBNP    Component Value Date/Time   PROBNP 1,366.0 (H) 10/08/2011 1532    Imaging: No results found.    Assessment & Plan:   Pleasant 67 year old patient seen office visit today.  Patient does not have formal COPD diagnosis based off of PFTs performed on 03/14/2018.  Will call the COPD Gold 0 as it does not fit our gold staging guidelines, with an asthmatic process.  I explained this to the patient.  We will also trial patient on Breo Ellipta 100 and have patient start using this daily.  Hopefully this will improve her shortness of breath as well  as decrease the amount she is having to use her rescue inhaler.  Previous blood work did not indicate any eosinophilia.  We will also add Pepcid 20 mg daily as well as provided GERD information for patient to follow as well as diet.  To help with cough.  Patient did not want to start omeprazole today.  COPD GOLD 0 with Asthmatic process  Breo Ellipta 100 >>> Take 1 puff daily in the morning right when you wake up >>>Rinse your mouth out after use >>>This is a daily maintenance inhaler, NOT a rescue inhaler >>>Contact our office if you are having difficulties affording or obtaining this medication >>>It is important for you to be able to take this daily and not miss any doses >>> Contact our office when you are halfway through to let us know how you are doing   Only use your albuterol as a rescue medication to be used if you can't catch your breath by resting or doing a relaxed purse lip breathing pattern.  - The less you use it, the better it will work when you need it. - Ok to use up to 2 puffs  every 4 hours if you must but call for immediate appointment if use goes up over your usual need - Don't leave home without it !!  (think of it like the spare tire for your car)   Follow-up with our office in 2 to 3 months  Flu vaccine after you see Trudie Reed   GERD (gastroesophageal  reflux disease) Pepcid 20mg  in the morning on an empty stomach   >>> Review GERD literature provided on discharge paperwork  Follow-up with our office in 2 to 3 months    Cough Breo Ellipta 100 >>> Take 1 puff daily in the morning right when you wake up >>>Rinse your mouth out after use >>>This is a daily maintenance inhaler, NOT a rescue inhaler >>>Contact our office if you are having difficulties affording or obtaining this medication >>>It is important for you to be able to take this daily and not miss any doses >>> Contact our office when you are halfway through to let us know how you are doing   Only use your albuterol as a rescue medication to be used if you can't catch your breath by resting or doing a relaxed purse lip breathing pattern.  - The less you use it, the better it will work when you need it. - Ok to use up to 2 puffs  every 4 hours if you must but call for immediate appointment if use goes up over your usual need - Don't leave home without it !!  (think of it like the spare tire for your car)   Pepcid 20mg  in the morning on an empty stomach    Follow-up with our office in 2 to 3 months      Lauraine Rinne, NP 03/14/2018

## 2018-03-14 NOTE — Assessment & Plan Note (Signed)
Breo Ellipta 100 >>> Take 1 puff daily in the morning right when you wake up >>>Rinse your mouth out after use >>>This is a daily maintenance inhaler, NOT a rescue inhaler >>>Contact our office if you are having difficulties affording or obtaining this medication >>>It is important for you to be able to take this daily and not miss any doses >>> Contact our office when you are halfway through to let us know how you are doing   Only use your albuterol as a rescue medication to be used if you can't catch your breath by resting or doing a relaxed purse lip breathing pattern.  - The less you use it, the better it will work when you need it. - Ok to use up to 2 puffs  every 4 hours if you must but call for immediate appointment if use goes up over your usual need - Don't leave home without it !!  (think of it like the spare tire for your car)   Follow-up with our office in 2 to 3 months  Flu vaccine after you see Trudie Reed

## 2018-03-14 NOTE — Progress Notes (Signed)
PFT completed today. 03/14/18

## 2018-03-15 ENCOUNTER — Telehealth: Payer: Self-pay | Admitting: Family

## 2018-03-15 NOTE — Telephone Encounter (Signed)
Aware.Tox assure does not check for infection in urine.

## 2018-03-21 ENCOUNTER — Telehealth: Payer: Self-pay | Admitting: Family

## 2018-03-21 MED ORDER — HYDROCODONE-HOMATROPINE 5-1.5 MG/5ML PO SYRP: 5.0000 mL | ORAL_SOLUTION | Freq: Three times a day (TID) | ORAL | 0 refills | Status: DC | PRN

## 2018-03-21 NOTE — Telephone Encounter (Signed)
Pt wants to speak to [facility] nurse about her apt she had at lung doctor

## 2018-03-21 NOTE — Telephone Encounter (Signed)
Pt states she went to the lung doctor and he started her on a new inhaler but she is still coughing and wants to know if you will refill her Hycodan? Please advise.

## 2018-03-21 NOTE — Telephone Encounter (Signed)
Prescription sent to pharmacy.

## 2018-03-25 ENCOUNTER — Encounter: Payer: Medicare Other | Admitting: *Deleted

## 2018-04-05 ENCOUNTER — Ambulatory Visit: Payer: Medicare Other | Admitting: Family

## 2018-04-08 ENCOUNTER — Other Ambulatory Visit: Payer: Self-pay | Admitting: Family

## 2018-04-08 DIAGNOSIS — M546 Pain in thoracic spine: Principal | ICD-10-CM

## 2018-04-08 DIAGNOSIS — G8929 Other chronic pain: Secondary | ICD-10-CM

## 2018-04-08 NOTE — Telephone Encounter (Signed)
Last seen 03/07/18  Stacy Dalton

## 2018-04-16 ENCOUNTER — Encounter: Payer: Medicare Other | Admitting: *Deleted

## 2018-04-22 ENCOUNTER — Telehealth: Payer: Self-pay | Admitting: Family

## 2018-04-22 MED ORDER — HYDROCODONE-HOMATROPINE 5-1.5 MG/5ML PO SYRP: 5.0000 mL | ORAL_SOLUTION | Freq: Three times a day (TID) | ORAL | 0 refills | Status: DC | PRN

## 2018-04-22 NOTE — Telephone Encounter (Signed)
Prescription sent to pharmacy.

## 2018-04-22 NOTE — Telephone Encounter (Signed)
Pt is needing a refill on her cough medication, she has construction going on in her house and was not able to come to her last apt  [place]

## 2018-04-23 ENCOUNTER — Telehealth: Payer: Self-pay | Admitting: Family

## 2018-04-23 ENCOUNTER — Other Ambulatory Visit: Payer: Self-pay

## 2018-04-23 MED ORDER — AMLODIPINE-OLMESARTAN 10-40 MG PO TABS: 1.0000 | ORAL_TABLET | Freq: Every day | ORAL | 1 refills | Status: DC

## 2018-04-23 MED ORDER — LEVOTHYROXINE SODIUM 25 MCG PO TABS: 25.0000 ug | ORAL_TABLET | Freq: Every day | ORAL | 1 refills | Status: DC

## 2018-04-23 NOTE — Telephone Encounter (Signed)
done

## 2018-04-24 DIAGNOSIS — N3001 Acute cystitis with hematuria: Secondary | ICD-10-CM | POA: Diagnosis not present

## 2018-04-24 DIAGNOSIS — R300 Dysuria: Secondary | ICD-10-CM | POA: Diagnosis not present

## 2018-04-25 NOTE — Telephone Encounter (Signed)
PT has called back and states that she is needing prior authorization according to [facility] phone number is [phone]. Pt states this needs to be done today that she is about out

## 2018-04-25 NOTE — Telephone Encounter (Signed)
[facility] is telling [facility] that they never received the AZOR we do have confirmation that it went through, can we resend to [facility] pt is about out

## 2018-04-26 ENCOUNTER — Other Ambulatory Visit: Payer: Self-pay | Admitting: *Deleted

## 2018-04-26 DIAGNOSIS — M546 Pain in thoracic spine: Principal | ICD-10-CM

## 2018-04-26 DIAGNOSIS — G8929 Other chronic pain: Secondary | ICD-10-CM

## 2018-04-29 MED ORDER — ONDANSETRON 4 MG PO TBDP: ORAL_TABLET | ORAL | 0 refills | Status: DC

## 2018-05-14 ENCOUNTER — Encounter: Payer: Medicare Other | Admitting: *Deleted

## 2018-05-17 ENCOUNTER — Ambulatory Visit: Payer: Medicare Other | Admitting: Family

## 2018-05-17 ENCOUNTER — Ambulatory Visit: Payer: Medicare Other | Admitting: Pulmonary Disease

## 2018-05-21 ENCOUNTER — Encounter: Payer: Self-pay | Admitting: Family

## 2018-05-21 ENCOUNTER — Ambulatory Visit (INDEPENDENT_AMBULATORY_CARE_PROVIDER_SITE_OTHER): Payer: Medicare Other | Admitting: Family

## 2018-05-21 VITALS — BP 169/89 | HR 104 | Temp 97.6°F | Ht 59.75 in | Wt 109.6 lb

## 2018-05-21 DIAGNOSIS — E785 Hyperlipidemia, unspecified: Secondary | ICD-10-CM

## 2018-05-21 DIAGNOSIS — J449 Chronic obstructive pulmonary disease, unspecified: Secondary | ICD-10-CM | POA: Diagnosis not present

## 2018-05-21 DIAGNOSIS — R05 Cough: Secondary | ICD-10-CM

## 2018-05-21 DIAGNOSIS — F331 Major depressive disorder, recurrent, moderate: Secondary | ICD-10-CM

## 2018-05-21 DIAGNOSIS — G8929 Other chronic pain: Secondary | ICD-10-CM

## 2018-05-21 DIAGNOSIS — E039 Hypothyroidism, unspecified: Secondary | ICD-10-CM | POA: Diagnosis not present

## 2018-05-21 DIAGNOSIS — F1120 Opioid dependence, uncomplicated: Secondary | ICD-10-CM

## 2018-05-21 DIAGNOSIS — J441 Chronic obstructive pulmonary disease with (acute) exacerbation: Secondary | ICD-10-CM

## 2018-05-21 DIAGNOSIS — E559 Vitamin D deficiency, unspecified: Secondary | ICD-10-CM

## 2018-05-21 DIAGNOSIS — G894 Chronic pain syndrome: Secondary | ICD-10-CM

## 2018-05-21 DIAGNOSIS — Z23 Encounter for immunization: Secondary | ICD-10-CM

## 2018-05-21 DIAGNOSIS — G4700 Insomnia, unspecified: Secondary | ICD-10-CM

## 2018-05-21 DIAGNOSIS — K219 Gastro-esophageal reflux disease without esophagitis: Secondary | ICD-10-CM

## 2018-05-21 DIAGNOSIS — M546 Pain in thoracic spine: Secondary | ICD-10-CM

## 2018-05-21 DIAGNOSIS — M8580 Other specified disorders of bone density and structure, unspecified site: Secondary | ICD-10-CM

## 2018-05-21 DIAGNOSIS — R053 Chronic cough: Secondary | ICD-10-CM

## 2018-05-21 DIAGNOSIS — I10 Essential (primary) hypertension: Secondary | ICD-10-CM

## 2018-05-21 DIAGNOSIS — Z0289 Encounter for other administrative examinations: Secondary | ICD-10-CM

## 2018-05-21 MED ORDER — VITAMIN D (ERGOCALCIFEROL) 1.25 MG (50000 UNIT) PO CAPS: ORAL_CAPSULE | ORAL | 3 refills | Status: DC

## 2018-05-21 MED ORDER — TEMAZEPAM 30 MG PO CAPS: 30.0000 mg | ORAL_CAPSULE | Freq: Every evening | ORAL | 5 refills | Status: DC | PRN

## 2018-05-21 MED ORDER — ONDANSETRON 4 MG PO TBDP: ORAL_TABLET | ORAL | 0 refills | Status: DC

## 2018-05-21 MED ORDER — DULOXETINE HCL 60 MG PO CPEP: 60.0000 mg | ORAL_CAPSULE | Freq: Every day | ORAL | 3 refills | Status: DC

## 2018-05-21 MED ORDER — FLUTICASONE-UMECLIDIN-VILANT 100-62.5-25 MCG/INH IN AEPB: 1.0000 | INHALATION_SPRAY | Freq: Every day | RESPIRATORY_TRACT | 1 refills | Status: DC

## 2018-05-21 MED ORDER — TRAMADOL HCL 50 MG PO TABS: 100.0000 mg | ORAL_TABLET | Freq: Three times a day (TID) | ORAL | 2 refills | Status: DC | PRN

## 2018-05-21 MED ORDER — HYDROCODONE-HOMATROPINE 5-1.5 MG/5ML PO SYRP: 5.0000 mL | ORAL_SOLUTION | Freq: Three times a day (TID) | ORAL | 0 refills | Status: DC | PRN

## 2018-05-21 MED ORDER — AMLODIPINE-OLMESARTAN 10-40 MG PO TABS: 1.0000 | ORAL_TABLET | Freq: Every day | ORAL | 1 refills | Status: DC

## 2018-05-21 NOTE — Addendum Note (Signed)
Addended by: Evelina Dun A on: 05/21/2018 11:38 AM   Modules accepted: Orders

## 2018-05-21 NOTE — Progress Notes (Signed)
Subjective:     Patient ID: Stacy Dalton, female    DOB: 28-Nov-1950, 67 y.o.   MRN: [id number]  Chief Complaint  Patient presents with  . Medical Management of Chronic Issues    three month recheck   Pt presents to the office today for chronic follow up. PT has pulmonologists for COPD and chronic cough.  Hypertension  This is a chronic problem. The current episode started more than 1 year ago. The problem has been waxing and waning since onset. The problem is uncontrolled. Associated symptoms include malaise/fatigue and shortness of breath. Pertinent negatives include no peripheral edema. Risk factors for coronary artery disease include dyslipidemia and sedentary lifestyle. The current treatment provides mild improvement. There is no history of kidney disease or CAD/MI. Identifiable causes of hypertension include a thyroid problem.  Hyperlipidemia  This is a chronic problem. The current episode started more than 1 year ago. The problem is uncontrolled. Recent lipid tests were reviewed and are high. Associated symptoms include shortness of breath. Current antihyperlipidemic treatment includes diet change. The current treatment provides no improvement of lipids. Risk factors for coronary artery disease include dyslipidemia, a sedentary lifestyle, hypertension and post-menopausal.  Gastroesophageal Reflux  She complains of coughing. She reports no belching or no heartburn. This is a chronic problem. The current episode started more than 1 year ago. The problem occurs occasionally. The problem has been waxing and waning. Associated symptoms include fatigue. She has tried a histamine-2 antagonist for the symptoms. The treatment provided mild relief.  Thyroid Problem  Presents for follow-up visit. Symptoms include depressed mood and fatigue. Patient reports no constipation or diarrhea. The symptoms have been stable. Her past medical history is significant for hyperlipidemia.  Insomnia  Primary  symptoms: difficulty falling asleep, frequent awakening, malaise/fatigue.  The current episode started more than one year. The onset quality is gradual. The problem occurs intermittently. PMH includes: depression.  Back Pain  This is a chronic problem. The current episode started more than 1 year ago. The problem occurs intermittently. The problem has been waxing and waning since onset. The pain is present in the thoracic spine. The quality of the pain is described as aching. The pain is at a severity of 6/10. The pain is moderate. The treatment provided mild relief.  Depression         This is a chronic problem.  The current episode started more than 1 year ago.   The onset quality is gradual.   The problem occurs intermittently.  The problem has been waxing and waning since onset.  Associated symptoms include fatigue, helplessness, hopelessness, insomnia, irritable and decreased interest.  Associated symptoms include not sad.  Past treatments include nothing.  Past medical history includes thyroid problem.   Anemia  Presents for follow-up visit. Symptoms include malaise/fatigue. There has been no anorexia or confusion.  Osteopenia Pt's last Dexascan was 0605/18. Pt takes Vit D, but does not take Calcium.    Review of Systems  Constitutional: Positive for fatigue and malaise/fatigue.  Respiratory: Positive for cough and shortness of breath.   Gastrointestinal: Negative for anorexia, constipation, diarrhea and heartburn.  Musculoskeletal: Positive for back pain.  Psychiatric/Behavioral: Positive for depression. Negative for confusion. The patient has insomnia.   All other systems reviewed and are negative.      Objective:   Physical Exam  Constitutional: She is oriented to person, place, and time. She appears well-developed and well-nourished. She is irritable. No distress.  HENT:  Head: Normocephalic and  atraumatic.  Right Ear: External ear normal.  Left Ear: External ear normal.    Mouth/Throat: Oropharynx is clear and moist.  Eyes: Pupils are equal, round, and reactive to light.  Neck: Normal range of motion. Neck supple. No thyromegaly present.  Cardiovascular: Normal rate, regular rhythm, normal heart sounds and intact distal pulses.  No murmur heard. Pulmonary/Chest: Effort normal and breath sounds normal. No respiratory distress. She has no wheezes.  Intermittent nonproductive cough  Abdominal: Soft. Bowel sounds are normal. She exhibits no distension. There is no tenderness.  Musculoskeletal: Normal range of motion. She exhibits no edema or tenderness.  Neurological: She is alert and oriented to person, place, and time. She has normal reflexes. No cranial nerve deficit.  Skin: Skin is warm and dry.  Psychiatric: She has a normal mood and affect. Her behavior is normal. Judgment and thought content normal.  Vitals reviewed.     BP (!) 147/79   Pulse (!) 103   Temp 97.6 F (36.4 C) (Oral)   Ht 4' 11.75" (1.518 m)   Wt 109 lb 9.6 oz (49.7 kg)   BMI 21.58 kg/m      Assessment & Plan:  Stacy Dalton comes in today with chief complaint of Medical Management of Chronic Issues (three month recheck)   Diagnosis and orders addressed:  1. Essential hypertension - CMP14+EGFR - CBC with Differential/Platelet - amLODipine-olmesartan (AZOR) 10-40 MG tablet; Take 1 tablet by mouth daily.  Dispense: 90 tablet; Refill: 1  2. Chronic obstructive pulmonary disease, unspecified COPD type ([place]) Keep follow up with pulmonologists   - CMP14+EGFR - CBC with Differential/Platelet - HYDROcodone-homatropine (HYCODAN) 5-1.5 MG/5ML syrup; Take 5 mLs by mouth every 8 (eight) hours as needed for cough.  Dispense: 240 mL; Refill: 0 - Fluticasone-Umeclidin-Vilant (TRELEGY ELLIPTA) 100-62.5-25 MCG/INH AEPB; Inhale 1 puff into the lungs daily.  Dispense: 3 each; Refill: 1  3. Hypothyroidism, unspecified type - CMP14+EGFR - TSH - CBC with Differential/Platelet  4.  Osteopenia, unspecified location - CMP14+EGFR - CBC with Differential/Platelet  5. Chronic bilateral thoracic back pain - CMP14+EGFR - CBC with Differential/Platelet - ondansetron (ZOFRAN-ODT) 4 MG disintegrating tablet; DISSOLVE 1 TABLET(4 MG) ON THE TONGUE EVERY 8 HOURS AS NEEDED FOR NAUSEA OR VOMITING  Dispense: 90 tablet; Refill: 0 - traMADol (ULTRAM) 50 MG tablet; Take 2 tablets (100 mg total) by mouth every 8 (eight) hours as needed (pain).  Dispense: 180 tablet; Refill: 2  6. Moderate episode of recurrent major depressive disorder (HCC) - CMP14+EGFR - CBC with Differential/Platelet  7. Insomnia, unspecified type - CMP14+EGFR - CBC with Differential/Platelet - temazepam (RESTORIL) 30 MG capsule; Take 1 capsule (30 mg total) by mouth at bedtime as needed for sleep.  Dispense: 30 capsule; Refill: 5  8. Uncomplicated opioid dependence (HCC) - CMP14+EGFR - CBC with Differential/Platelet - traMADol (ULTRAM) 50 MG tablet; Take 2 tablets (100 mg total) by mouth every 8 (eight) hours as needed (pain).  Dispense: 180 tablet; Refill: 2  9. Pain medication agreement signed - CMP14+EGFR - CBC with Differential/Platelet - traMADol (ULTRAM) 50 MG tablet; Take 2 tablets (100 mg total) by mouth every 8 (eight) hours as needed (pain).  Dispense: 180 tablet; Refill: 2  10. Gastroesophageal reflux disease without esophagitis - CMP14+EGFR - CBC with Differential/Platelet  11. Hyperlipidemia, unspecified hyperlipidemia type - CMP14+EGFR - Lipid panel - CBC with Differential/Platelet  12. Chronic pain syndrome - traMADol (ULTRAM) 50 MG tablet; Take 2 tablets (100 mg total) by mouth every 8 (eight) hours as  needed (pain).  Dispense: 180 tablet; Refill: 2  13. Chronic obstructive pulmonary disease with acute exacerbation ([place])  14. Chronic cough - HYDROcodone-homatropine (HYCODAN) 5-1.5 MG/5ML syrup; Take 5 mLs by mouth every 8 (eight) hours as needed for cough.  Dispense: 240 mL; Refill:  0  15. Vitamin D deficiency - Vitamin D, Ergocalciferol, (DRISDOL) 1.25 MG (50000 UT) CAPS capsule; TAKE 1 CAPSULE (50000 UNITS) BY MOUTH EVERY 7 DAYS  Dispense: 12 capsule; Refill: 3   Labs pending Health Maintenance reviewed Diet and exercise encouraged  Follow up plan: 3 months    Evelina Dun, FNP

## 2018-05-21 NOTE — Patient Instructions (Signed)

## 2018-05-22 ENCOUNTER — Other Ambulatory Visit: Payer: Self-pay | Admitting: Family

## 2018-05-22 ENCOUNTER — Other Ambulatory Visit: Payer: Self-pay | Admitting: *Deleted

## 2018-05-22 DIAGNOSIS — R52 Pain, unspecified: Secondary | ICD-10-CM

## 2018-05-22 DIAGNOSIS — R309 Painful micturition, unspecified: Secondary | ICD-10-CM

## 2018-05-22 LAB — LIPID PANEL
Chol/HDL Ratio: 2.8 ratio (ref 0.0–4.4)
Cholesterol, Total: 209 mg/dL — ABNORMAL HIGH (ref 100–199)
HDL: 74 mg/dL (ref >39)
LDL Calculated: 123 mg/dL — ABNORMAL HIGH (ref 0–99)
Triglycerides: 61 mg/dL (ref 0–149)
VLDL Cholesterol Cal: 12 mg/dL (ref 5–40)

## 2018-05-22 LAB — TSH: TSH: 1.75 u[IU]/mL (ref 0.450–4.500)

## 2018-05-22 LAB — CBC WITH DIFFERENTIAL/PLATELET
Basophils Absolute: 0 10*3/uL (ref 0.0–0.2)
Basos: 0 %
EOS (ABSOLUTE): 0.1 10*3/uL (ref 0.0–0.4)
Eos: 2 %
Hematocrit: 42.6 % (ref 34.0–46.6)
Hemoglobin: 14 g/dL (ref 11.1–15.9)
Immature Grans (Abs): 0.1 10*3/uL (ref 0.0–0.1)
Immature Granulocytes: 1 %
Lymphocytes Absolute: 1 10*3/uL (ref 0.7–3.1)
Lymphs: 15 %
MCH: 27.5 pg (ref 26.6–33.0)
MCHC: 32.9 g/dL (ref 31.5–35.7)
MCV: 84 fL (ref 79–97)
Monocytes Absolute: 0.6 10*3/uL (ref 0.1–0.9)
Monocytes: 9 %
Neutrophils Absolute: 5 10*3/uL (ref 1.4–7.0)
Neutrophils: 73 %
Platelets: 400 10*3/uL (ref 150–450)
RBC: 5.09 x10E6/uL (ref 3.77–5.28)
RDW: 12.9 % (ref 12.3–15.4)
WBC: 6.8 10*3/uL (ref 3.4–10.8)

## 2018-05-22 LAB — CMP14+EGFR
ALT: 10 IU/L (ref 0–32)
AST: 16 IU/L (ref 0–40)
Albumin/Globulin Ratio: 2.3 — ABNORMAL HIGH (ref 1.2–2.2)
Albumin: 4.9 g/dL — ABNORMAL HIGH (ref 3.6–4.8)
Alkaline Phosphatase: 112 IU/L (ref 39–117)
BUN/Creatinine Ratio: 15 (ref 12–28)
BUN: 15 mg/dL (ref 8–27)
Bilirubin Total: 0.3 mg/dL (ref 0.0–1.2)
CO2: 24 mmol/L (ref 20–29)
Calcium: 10.2 mg/dL (ref 8.7–10.3)
Chloride: 97 mmol/L (ref 96–106)
Creatinine, Ser: 1.01 mg/dL — ABNORMAL HIGH (ref 0.57–1.00)
GFR calc Af Amer: 66 mL/min/{1.73_m2} (ref >59)
GFR calc non Af Amer: 57 mL/min/{1.73_m2} — ABNORMAL LOW (ref >59)
Globulin, Total: 2.1 g/dL (ref 1.5–4.5)
Glucose: 110 mg/dL — ABNORMAL HIGH (ref 65–99)
Potassium: 3.6 mmol/L (ref 3.5–5.2)
Sodium: 141 mmol/L (ref 134–144)
Total Protein: 7 g/dL (ref 6.0–8.5)

## 2018-05-22 MED ORDER — ATORVASTATIN CALCIUM 20 MG PO TABS: 20.0000 mg | ORAL_TABLET | Freq: Every day | ORAL | 11 refills | Status: DC

## 2018-05-23 ENCOUNTER — Other Ambulatory Visit: Payer: Self-pay | Admitting: Family

## 2018-05-23 ENCOUNTER — Encounter: Payer: Self-pay | Admitting: *Deleted

## 2018-05-23 ENCOUNTER — Ambulatory Visit (INDEPENDENT_AMBULATORY_CARE_PROVIDER_SITE_OTHER): Payer: Medicare Other | Admitting: *Deleted

## 2018-05-23 VITALS — BP 147/78 | HR 93 | Ht 59.5 in | Wt 110.0 lb

## 2018-05-23 DIAGNOSIS — R52 Pain, unspecified: Secondary | ICD-10-CM

## 2018-05-23 DIAGNOSIS — J441 Chronic obstructive pulmonary disease with (acute) exacerbation: Secondary | ICD-10-CM

## 2018-05-23 DIAGNOSIS — Z0000 Encounter for general adult medical examination without abnormal findings: Secondary | ICD-10-CM

## 2018-05-23 DIAGNOSIS — R309 Painful micturition, unspecified: Secondary | ICD-10-CM

## 2018-05-23 DIAGNOSIS — N3000 Acute cystitis without hematuria: Secondary | ICD-10-CM

## 2018-05-23 DIAGNOSIS — Z79891 Long term (current) use of opiate analgesic: Secondary | ICD-10-CM | POA: Diagnosis not present

## 2018-05-23 DIAGNOSIS — H9190 Unspecified hearing loss, unspecified ear: Secondary | ICD-10-CM

## 2018-05-23 LAB — URINALYSIS, COMPLETE
Bilirubin, UA: NEGATIVE
Glucose, UA: NEGATIVE
Ketones, UA: NEGATIVE
Nitrite, UA: POSITIVE — AB
Specific Gravity, UA: <1.005 — ABNORMAL LOW (ref 1.005–1.030)
Urobilinogen, Ur: 0.2 mg/dL (ref 0.2–1.0)
pH, UA: 6 (ref 5.0–7.5)

## 2018-05-23 LAB — MICROSCOPIC EXAMINATION: WBC, UA: >30 /hpf — AB (ref 0–5)

## 2018-05-23 MED ORDER — CEPHALEXIN 500 MG PO CAPS: 500.0000 mg | ORAL_CAPSULE | Freq: Two times a day (BID) | ORAL | 0 refills | Status: DC

## 2018-05-23 NOTE — Patient Instructions (Signed)
Please work on your goal of having 3 meals per day- of mostly vegetables, fruits, whole grains, and lean proteins.  Try carnation instant breakfast mixed with milk for breakfast if you do not feel like eating a meal.   Please review the information given on Advance Directives, and if you complete the paper work please bring a copy to our office to be filed in your chart.   Please follow up with Evelina Dun, FNP as scheduled.   Thank you for coming in for your Annual Wellness Visit (67 years old), and hope you have a Happy Thanksgiving!!    Preventive Care 69 Years and Older, Female Preventive care refers to lifestyle choices and visits with your health care provider that can promote health and wellness. What does preventive care include?  A yearly physical exam. This is also called an annual well check.  Dental exams once or twice a year.  Routine eye exams. Ask your health care provider how often you should have your eyes checked.  Personal lifestyle choices, including: ? Daily care of your teeth and gums. ? Regular physical activity. ? Eating a healthy diet. ? Avoiding tobacco and drug use. ? Limiting alcohol use. ? Practicing safe sex. ? Taking low-dose aspirin every day. ? Taking vitamin and mineral supplements as recommended by your health care provider. What happens during an annual well check? The services and screenings done by your health care provider during your annual well check will depend on your age, overall health, lifestyle risk factors, and family history of disease. Counseling Your health care provider may ask you questions about your:  Alcohol use.  Tobacco use.  Drug use.  Emotional well-being.  Home and relationship well-being.  Sexual activity.  Eating habits.  History of falls.  Memory and ability to understand (cognition).  Work and work Statistician.  Reproductive health.  Screening You may have the following tests or measurements:  Height,  weight, and BMI.  Blood pressure.  Lipid and cholesterol levels. These may be checked every 5 years, or more frequently if you are over 46 years old.  Skin check.  Lung cancer screening. You may have this screening every year starting at age 74 if you have a 30-pack-year history of smoking and currently smoke or have quit within the past 15 years.  Fecal occult blood test (FOBT) of the stool. You may have this test every year starting at age 40.  Flexible sigmoidoscopy or colonoscopy. You may have a sigmoidoscopy every 5 years or a colonoscopy every 10 years starting at age 38.  Hepatitis C blood test.  Hepatitis B blood test.  Sexually transmitted disease (STD) testing.  Diabetes screening. This is done by checking your blood sugar (glucose) after you have not eaten for a while (fasting). You may have this done every 1-3 years.  Bone density scan. This is done to screen for osteoporosis. You may have this done starting at age 27.  Mammogram. This may be done every 1-2 years. Talk to your health care provider about how often you should have regular mammograms.  Talk with your health care provider about your test results, treatment options, and if necessary, the need for more tests. Vaccines Your health care provider may recommend certain vaccines, such as:  Influenza vaccine. This is recommended every year.  Tetanus, diphtheria, and acellular pertussis (Tdap, Td) vaccine. You may need a Td booster every 10 years.  Varicella vaccine. You may need this if you have not been vaccinated.  Zoster vaccine. You  may need this after age 31.  Measles, mumps, and rubella (MMR) vaccine. You may need at least one dose of MMR if you were born in 1957 or later. You may also need a second dose.  Pneumococcal 13-valent conjugate (PCV13) vaccine. One dose is recommended after age 59.  Pneumococcal polysaccharide (PPSV23) vaccine. One dose is recommended after age 24.  Meningococcal vaccine.  You may need this if you have certain conditions.  Hepatitis A vaccine. You may need this if you have certain conditions or if you travel or work in places where you may be exposed to hepatitis A.  Hepatitis B vaccine. You may need this if you have certain conditions or if you travel or work in places where you may be exposed to hepatitis B.  Haemophilus influenzae type b (Hib) vaccine. You may need this if you have certain conditions.  Talk to your health care provider about which screenings and vaccines you need and how often you need them. This information is not intended to replace advice given to you by your health care provider. Make sure you discuss any questions you have with your health care provider. Document Released: 07/16/2015 Document Revised: 03/08/2016 Document Reviewed: 04/20/2015 Elsevier Interactive Patient Education  2018 [place] in the Home Falls can cause injuries. They can happen to people of all ages. There are many things you can do to make your home safe and to help prevent falls. What can I do on the outside of my home?  Regularly fix the edges of walkways and driveways and fix any cracks.  Remove anything that might make you trip as you walk through a door, such as a raised step or threshold.  Trim any bushes or trees on the path to your home.  Use bright outdoor lighting.  Clear any walking paths of anything that might make someone trip, such as rocks or tools.  Regularly check to see if handrails are loose or broken. Make sure that both sides of any steps have handrails.  Any raised decks and porches should have guardrails on the edges.  Have any leaves, snow, or ice cleared regularly.  Use sand or salt on walking paths during winter.  Clean up any spills in your garage right away. This includes oil or grease spills. What can I do in the bathroom?  Use night lights.  Install grab bars by the toilet and in the tub and  shower. Do not use towel bars as grab bars.  Use non-skid mats or decals in the tub or shower.  If you need to sit down in the shower, use a plastic, non-slip stool.  Keep the floor dry. Clean up any water that spills on the floor as soon as it happens.  Remove soap buildup in the tub or shower regularly.  Attach bath mats securely with double-sided non-slip rug tape.  Do not have throw rugs and other things on the floor that can make you trip. What can I do in the bedroom?  Use night lights.  Make sure that you have a light by your bed that is easy to reach.  Do not use any sheets or blankets that are too big for your bed. They should not hang down onto the floor.  Have a firm chair that has side arms. You can use this for support while you get dressed.  Do not have throw rugs and other things on the floor that can make you trip. What can  I do in the kitchen?  Clean up any spills right away.  Avoid walking on wet floors.  Keep items that you use a lot in easy-to-reach places.  If you need to reach something above you, use a strong step stool that has a grab bar.  Keep electrical cords out of the way.  Do not use floor polish or wax that makes floors slippery. If you must use wax, use non-skid floor wax.  Do not have throw rugs and other things on the floor that can make you trip. What can I do with my stairs?  Do not leave any items on the stairs.  Make sure that there are handrails on both sides of the stairs and use them. Fix handrails that are broken or loose. Make sure that handrails are as long as the stairways.  Check any carpeting to make sure that it is firmly attached to the stairs. Fix any carpet that is loose or worn.  Avoid having throw rugs at the top or bottom of the stairs. If you do have throw rugs, attach them to the floor with carpet tape.  Make sure that you have a light switch at the top of the stairs and the bottom of the stairs. If you do not  have them, ask someone to add them for you. What else can I do to help prevent falls?  Wear shoes that: ? Do not have high heels. ? Have rubber bottoms. ? Are comfortable and fit you well. ? Are closed at the toe. Do not wear sandals.  If you use a stepladder: ? Make sure that it is fully opened. Do not climb a closed stepladder. ? Make sure that both sides of the stepladder are locked into place. ? Ask someone to hold it for you, if possible.  Clearly mark and make sure that you can see: ? Any grab bars or handrails. ? First and last steps. ? Where the edge of each step is.  Use tools that help you move around (mobility aids) if they are needed. These include: ? Canes. ? Walkers. ? Scooters. ? Crutches.  Turn on the lights when you go into a dark area. Replace any light bulbs as soon as they burn out.  Set up your furniture so you have a clear path. Avoid moving your furniture around.  If any of your floors are uneven, fix them.  If there are any pets around you, be aware of where they are.  Review your medicines with your doctor. Some medicines can make you feel dizzy. This can increase your chance of falling. Ask your doctor what other things that you can do to help prevent falls. This information is not intended to replace advice given to you by your health care provider. Make sure you discuss any questions you have with your health care provider. Document Released: 04/15/2009 Document Revised: 11/25/2015 Document Reviewed: 07/24/2014 Elsevier Interactive Patient Education  [employer].

## 2018-05-23 NOTE — Progress Notes (Addendum)
Subjective:   Stacy Dalton is a 67 y.o. female who presents for Medicare Annual (Subsequent) preventive examination.  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  She enjoys going to church, watching television, and reading.  She has 5 children, 15 grandchildren, and 4 great grandchildren.  She is very involved with raising 2 of her daughter's children and provides most of their needs financially.  The children are 43 and 34 years old, and they live with their father but spend a significant amount of time at her home.  She states she worries and feels stressed about the care of her grandchildren when they are not with her.  She feels that worrying about her grandchildren and the death of her husband 5 years ago contribute to her worsened depression symptoms.  Stacy Dalton feels that her health is worse this year than last year because she does not feel as good in general.  She reports no surgeries, ER visits, or hospitalizations in the past year.    Review of Systems:   Musculoskeletal- back pain Other systems negative   Cardiac Risk Factors include: advanced age (>27men, >31 women);dyslipidemia;hypertension;sedentary lifestyle     Objective:     Vitals: BP (!) 147/78   Pulse 93   Ht 4' 11.5" (1.511 m)   Wt 110 lb (49.9 kg)   BMI 21.85 kg/m   Body mass index is 21.85 kg/m.  Advanced Directives 05/23/2018 05/15/2017 02/23/2017 02/22/2017 12/04/2016 08/17/2016 04/12/2016  Does Patient Have a Medical Advance Directive? No No No No No Yes No  Would patient like information on creating a medical advance directive? Yes (MAU/Ambulatory/Procedural Areas - Information given) (No Data) No - Patient declined - Yes (ED - Information included in AVS) - No - patient declined information  Pre-existing out of facility DNR order (yellow form or pink MOST form) - - - - - - -    Tobacco Social History   Tobacco Use  Smoking Status Former Smoker  .  Packs/day: 1.50  . Years: 20.00  . Pack years: 30.00  . Types: Cigarettes  . Last attempt to quit: 10/04/1998  . Years since quitting: 19.6  Smokeless Tobacco Never Used     Counseling given: No   Clinical Intake:     Pain Score: 3                  Past Medical History:  Diagnosis Date  . COPD (chronic obstructive pulmonary disease) ([place])    told has copd, no current inhaler use  . Depression   . GERD (gastroesophageal reflux disease)   . Hypertension   . Hypothyroidism   . Insomnia   . Migraine   . Migraine   . Osteopenia   . Panic attacks    Past Surgical History:  Procedure Laterality Date  . ABDOMINAL HYSTERECTOMY    . COLON SURGERY    . COLOSTOMY CLOSURE  04/19/2012   Procedure: COLOSTOMY CLOSURE;  Surgeon: Adin Hector, MD;  Location: [facility];  Service: General;  Laterality: N/A;  Laparotomy, Resection and Closure of Colostomy  . COLOSTOMY TAKEDOWN N/A 04/12/2016   Procedure: [place] TAKEDOWN;  Surgeon: Johnathan Hausen, MD;  Location: [facility];  Service: General;  Laterality: N/A;  . INCONTINENCE SURGERY    . LAPAROTOMY  10/04/2011, colostomy also   Procedure: EXPLORATORY LAPAROTOMY;  Surgeon: Adin Hector, MD;  Location: [facility];  Service: General;  Laterality: N/A;  left  partial colectomy with colostomy  . LAPAROTOMY  04/19/2012   Procedure: EXPLORATORY LAPAROTOMY;  Surgeon: Adin Hector, MD;  Location: [facility];  Service: General;  Laterality: N/A;  . LAPAROTOMY N/A 11/29/2015   Procedure: EXPLORATORY LAPAROTOMY; SUBTOTAL COLECTOMY WITH HARTMAN PROCEDURE AND END COLOSTOMY;  Surgeon: Johnathan Hausen, MD;  Location: [facility];  Service: General;  Laterality: N/A;  . TUBAL LIGATION    . VENTRAL HERNIA REPAIR  04/19/2012   Procedure: HERNIA REPAIR VENTRAL ADULT;  Surgeon: Adin Hector, MD;  Location: [facility];  Service: General;  Laterality: N/A;   Family History  Problem Relation Age of Onset  . Heart disease Mother   . Hypertension Mother   .  Cancer Sister        sinus  . Diabetes Brother   . Heart disease Brother   . Thyroid disease Brother   . Cancer Sister        abdominal ?  . Thyroid disease Sister   . Cancer Other        GE junction adenocarcinoma  . Emphysema Father   . Stroke Father   . Hypertension Father   . Heart disease Father   . COPD Sister   . Thyroid disease Sister   . Thyroid disease Sister   . Diabetes Brother   . Thyroid disease Brother   . Thyroid disease Brother   . Hypertension Brother   . Post-traumatic stress disorder Brother   . COPD Brother   . Thyroid disease Brother   . Thyroid disease Brother   . Hypertension Brother   . Transient ischemic attack Brother    Social History   Socioeconomic History  . Marital status: Widowed    Spouse name: Not on file  . Number of children: 5  . Years of education: 8  . Highest education level: 8th grade  Occupational History  . Occupation: Retired  Scientific laboratory technician  . Financial resource strain: Not very hard  . Food insecurity:    Worry: Never true    Inability: Never true  . Transportation needs:    Medical: No    Non-medical: No  Tobacco Use  . Smoking status: Former Smoker    Packs/day: 1.50    Years: 20.00    Pack years: 30.00    Types: Cigarettes    Last attempt to quit: 10/04/1998    Years since quitting: 19.6  . Smokeless tobacco: Never Used  Substance and Sexual Activity  . Alcohol use: No  . Drug use: No  . Sexual activity: Not on file  Lifestyle  . Physical activity:    Days per week: 0 days    Minutes per session: 0 min  . Stress: Very much  Relationships  . Social connections:    Talks on phone: More than three times a week    Gets together: Three times a week    Attends religious service: More than 4 times per year    Active member of club or organization: Yes    Attends meetings of clubs or organizations: More than 4 times per year    Relationship status: Widowed  Other Topics Concern  . Not on file  Social  History Narrative  . Not on file    Outpatient Encounter Medications as of 05/23/2018  Medication Sig  . albuterol (VENTOLIN HFA) 108 (90 Base) MCG/ACT inhaler Inhale into the lungs every 6 (six) hours as needed for wheezing or shortness of breath.  [name] amLODipine-olmesartan (AZOR) 10-40 MG  tablet Take 1 tablet by mouth daily.  [name] atorvastatin (LIPITOR) 20 MG tablet Take 1 tablet (20 mg total) by mouth daily.  . DULoxetine (CYMBALTA) 60 MG capsule Take 1 capsule (60 mg total) by mouth daily.  . Fluticasone Furoate-Vilanterol (BREO ELLIPTA IN) Inhale 1 puff into the lungs daily.  [name] HYDROcodone-homatropine (HYCODAN) 5-1.5 MG/5ML syrup Take 5 mLs by mouth every 8 (eight) hours as needed for cough.  . levothyroxine (SYNTHROID, LEVOTHROID) 25 MCG tablet Take 1 tablet (25 mcg total) by mouth daily before breakfast.  . ondansetron (ZOFRAN-ODT) 4 MG disintegrating tablet DISSOLVE 1 TABLET(4 MG) ON THE TONGUE EVERY 8 HOURS AS NEEDED FOR NAUSEA OR VOMITING  . temazepam (RESTORIL) 30 MG capsule Take 1 capsule (30 mg total) by mouth at bedtime as needed for sleep.  . traMADol (ULTRAM) 50 MG tablet Take 2 tablets (100 mg total) by mouth every 8 (eight) hours as needed (pain).  . Vitamin D, Ergocalciferol, (DRISDOL) 1.25 MG (50000 UT) CAPS capsule TAKE 1 CAPSULE (50000 UNITS) BY MOUTH EVERY 7 DAYS  . famotidine (PEPCID) 20 MG tablet Take 1 tablet (20 mg total) by mouth daily. (Patient not taking: Reported on 05/23/2018)  . Fluticasone-Umeclidin-Vilant (TRELEGY ELLIPTA) 100-62.5-25 MCG/INH AEPB Inhale 1 puff into the lungs daily. (Patient not taking: Reported on 05/23/2018)   No facility-administered encounter medications on file as of 05/23/2018.     Activities of Daily Living In your present state of health, do you have any difficulty performing the following activities: 05/23/2018  Hearing? Y  Comment Feels like hearing is decreased- interested in audiology referral  Vision? N  Difficulty concentrating  or making decisions? Y  Comment Trouble concentrating, remembering and making decisions  Walking or climbing stairs? N  Dressing or bathing? N  Doing errands, shopping? N  Preparing Food and eating ? Y  Comment Able to prepare food, but has decreased appetite   Using the Toilet? N  In the past six months, have you accidently leaked urine? Y  Comment urinary urgency if she holds urine very long  Do you have problems with loss of bowel control? N  Managing your Medications? N  Managing your Finances? N  Housekeeping or managing your Housekeeping? N  Some recent data might be hidden     Patient Care Team: [name], FNP as PCP - General (Family Medicine) Marshell Garfinkel, MD as Consulting Physician (Pulmonary Disease)    Assessment:   This is a routine wellness examination for Stacy Dalton.  Exercise Activities and Dietary recommendations  Stacy Dalton states she usually eats 1-2 meals per day.  States her appetite has not been good lately because she has been feeling depressed.  She stopped taking cymbalta a couple of months ago, and is planning to restart it [facility]  She is hopeful that her depression symptoms and appetite will improve once she restarts cymbalta.  Encouraged patient to try Ensure or boost shakes when she does not feel like eating a meal.  She states she does not like them.  Recommended Carnation instant breakfast mixed with 2% or whole milk for breakfast.  Recommended diet of mostly lean proteins, vegetables, fruits and whole grains, but advised her to eat what was appetizing to her as long as she is eating until she feels better.  She plans to continue working on her goal from last year of having 3 balanced meals per day.    Current Exercise Habits: The patient does not participate in  regular exercise at present  Goals    . Exercise 3x per week (30 min per time)     Increase walking for exercise.     . Have 3 meals a day      Please work on having 3 balanced meals per day including mostly lean proteins, vegetables, fruits, and whole grains.        Fall Risk Fall Risk  05/23/2018 03/07/2018 12/14/2017 12/08/2017 12/07/2017  Falls in the past year? 1 No No No No  Number falls in past yr: 0 - - - -  Injury with Fall? 0 - - - -  Comment - - - - -  Risk for fall due to : Impaired balance/gait - - - -  Follow up Education provided;Falls prevention discussed - - - -   Is the patient's home free of loose throw rugs in walkways, pet beds, electrical cords, etc?   yes      Grab bars in the bathroom? no      Handrails on the stairs?   no stairs in home      Adequate lighting?   yes   Depression Screen PHQ 2/9 Scores 05/23/2018 05/21/2018 03/07/2018 01/01/2018  PHQ - 2 Score 2 2 2 1   PHQ- 9 Score 6 6 6  -  Exception Documentation - - - -     Stacy Dalton reports that she stopped taking Cymbalta a couple of months ago.  She is feeling more depressed recently she plans to restart it as soon as she [facility].  Patient states she would interested in talking with a counselor regarding depression.  Handout given with contact information for Mental Health providers in [place] and [place] counties.  Advised patient to call to schedule an appointment as soon as possible.    Cognitive Function MMSE - Mini Mental State Exam 05/23/2018 12/04/2016 06/16/2015  Orientation to time 5 5 5   Orientation to Place 5 5 5   Registration 3 3 3   Attention/ Calculation 5 5 5   Recall 2 2 3   Language- name 2 objects 2 2 2   Language- repeat 1 1 1   Language- follow 3 step command 3 3 3   Language- read & follow direction 1 1 1   Write a sentence 1 1 1   Copy design 1 1 1   Total score 29 29 30         Immunization History  Administered Date(s) Administered  . Influenza Whole 04/29/2012  . Influenza, High Dose Seasonal PF 05/21/2018  . Influenza,inj,Quad PF,6+ Mos 05/05/2013, 06/10/2014, 05/13/2015, 05/05/2016      Qualifies for Shingles Vaccine? Yes, Declined today  Screening Tests Health Maintenance  Topic Date Due  . TETANUS/TDAP  07/17/1967  . MAMMOGRAM  07/16/1998  . PNA vac Low Risk Adult (1 of 2 - PCV13) 11/17/2020 (Originally 07/16/2013)  . DEXA SCAN  12/06/2018  . COLONOSCOPY  02/27/2022  . INFLUENZA VACCINE  Completed  . Hepatitis C Screening  Completed   Tdap, prevnar 13 declined today Patient states she will consider getting a mammogram and call for an appointment on the mobile unit [facility] at [facility] if she decides to have test completed.   Cancer Screenings: Lung: Low Dose CT Chest recommended if Age 28-80 years, 30 pack-year currently smoking OR have quit w/in 15years. Patient does not qualify. Breast:  Up to date on Mammogram? No   Up to date of Bone Density/Dexa? Yes Colorectal: up to date  Additional Screenings:  Hepatitis C Screening:  Completed 08/17/2015     Plan:     Work on your goal of having 3 meals per day- of mostly vegetables, fruits, whole grains, and lean proteins.  Try carnation instant breakfast mixed with milk for breakfast if you do not feel like eating a meal.  Review the information given on Advance Directives, and if you complete the paper work please bring a copy to our office to be filed in your chart.  Follow up with Evelina Dun, FNP as scheduled.  Call to schedule an appointment with one of the counselors on the list given at your appointment today Recommend calling office to schedule mammogram    I have personally reviewed and noted the following in the patient's chart:   . Medical and social history . Use of alcohol, tobacco or illicit drugs  . Current medications and supplements . Functional ability and status . Nutritional status . Physical activity . Advanced directives . List of other physicians . Hospitalizations, surgeries, and ER visits in previous 12 months . Vitals . Screenings to include cognitive, depression, and  falls . Referrals and appointments  In addition, I have reviewed and discussed with patient certain preventive protocols, quality metrics, and best practice recommendations. A written personalized care plan for preventive services as well as general preventive health recommendations were provided to patient.     Jonh Mcqueary M, RN  05/23/2018    I have reviewed and agree with the above AWV documentation.   Evelina Dun, FNP

## 2018-05-24 ENCOUNTER — Ambulatory Visit: Payer: Medicare Other | Admitting: Family

## 2018-05-26 LAB — TOXASSURE SELECT 13 (MW), URINE

## 2018-06-13 ENCOUNTER — Other Ambulatory Visit: Payer: Self-pay | Admitting: Family

## 2018-06-13 DIAGNOSIS — R053 Chronic cough: Secondary | ICD-10-CM

## 2018-06-13 DIAGNOSIS — J449 Chronic obstructive pulmonary disease, unspecified: Secondary | ICD-10-CM

## 2018-06-13 DIAGNOSIS — R05 Cough: Secondary | ICD-10-CM

## 2018-06-13 NOTE — Telephone Encounter (Signed)
PT is needing a refill on her HYDROcodone-homatropine (HYCODAN) 5-1.5 MG/5ML syrup states that Stacy Dalton has been refilling it for her.  [facility] [place].    Apt with lung doctor is not until after Christmas

## 2018-06-14 ENCOUNTER — Telehealth: Payer: Self-pay | Admitting: Family

## 2018-06-14 DIAGNOSIS — R053 Chronic cough: Secondary | ICD-10-CM

## 2018-06-14 DIAGNOSIS — J449 Chronic obstructive pulmonary disease, unspecified: Secondary | ICD-10-CM

## 2018-06-14 DIAGNOSIS — R05 Cough: Secondary | ICD-10-CM

## 2018-06-14 MED ORDER — HYDROCODONE-HOMATROPINE 5-1.5 MG/5ML PO SYRP: 5.0000 mL | ORAL_SOLUTION | Freq: Three times a day (TID) | ORAL | 0 refills | Status: DC | PRN

## 2018-06-14 NOTE — Telephone Encounter (Signed)
Please send hycodan syrup to [facility]. It went to mail order but has been cancelled. Thanks

## 2018-06-24 ENCOUNTER — Encounter: Payer: Self-pay | Admitting: Family

## 2018-06-24 ENCOUNTER — Ambulatory Visit (INDEPENDENT_AMBULATORY_CARE_PROVIDER_SITE_OTHER): Payer: Medicare Other | Admitting: Family

## 2018-06-24 VITALS — BP 119/67 | HR 55 | Temp 99.2°F | Ht 59.5 in | Wt 108.0 lb

## 2018-06-24 DIAGNOSIS — Z20828 Contact with and (suspected) exposure to other viral communicable diseases: Secondary | ICD-10-CM | POA: Diagnosis not present

## 2018-06-24 DIAGNOSIS — R6889 Other general symptoms and signs: Secondary | ICD-10-CM

## 2018-06-24 DIAGNOSIS — J449 Chronic obstructive pulmonary disease, unspecified: Secondary | ICD-10-CM | POA: Diagnosis not present

## 2018-06-24 DIAGNOSIS — R05 Cough: Secondary | ICD-10-CM

## 2018-06-24 DIAGNOSIS — R053 Chronic cough: Secondary | ICD-10-CM

## 2018-06-24 LAB — VERITOR FLU A/B WAIVED
Influenza A: NEGATIVE
Influenza B: NEGATIVE

## 2018-06-24 LAB — RAPID STREP SCREEN (MED CTR MEBANE ONLY): Strep Gp A Ag, IA W/Reflex: NEGATIVE

## 2018-06-24 LAB — CULTURE, GROUP A STREP

## 2018-06-24 MED ORDER — OSELTAMIVIR PHOSPHATE 75 MG PO CAPS: 75.0000 mg | ORAL_CAPSULE | Freq: Two times a day (BID) | ORAL | 0 refills | Status: DC

## 2018-06-24 MED ORDER — PREDNISONE 10 MG (21) PO TBPK: ORAL_TABLET | ORAL | 0 refills | Status: DC

## 2018-06-24 MED ORDER — HYDROCODONE-HOMATROPINE 5-1.5 MG/5ML PO SYRP: 5.0000 mL | ORAL_SOLUTION | Freq: Three times a day (TID) | ORAL | 0 refills | Status: DC | PRN

## 2018-06-24 MED ORDER — DOXYCYCLINE HYCLATE 100 MG PO TABS: 100.0000 mg | ORAL_TABLET | Freq: Two times a day (BID) | ORAL | 0 refills | Status: DC

## 2018-06-24 NOTE — Progress Notes (Signed)
Subjective:    Patient ID: Stacy Dalton, female    DOB: 09/09/1950, 67 y.o.   MRN: [id number]  Chief Complaint  Patient presents with  . Generalized Body Aches    Cough  This is a new problem. The current episode started in the past 7 days. The problem has been gradually worsening. The problem occurs every few minutes. The cough is productive of sputum. Associated symptoms include ear congestion, ear pain, a fever, headaches, myalgias, nasal congestion, postnasal drip, rhinorrhea, a sore throat, shortness of breath and wheezing. Pertinent negatives include no chills. The symptoms are aggravated by lying down. She has tried rest and OTC cough suppressant for the symptoms. The treatment provided mild relief. Her past medical history is significant for COPD.      Review of Systems  Constitutional: Positive for fever. Negative for chills.  HENT: Positive for ear pain, postnasal drip, rhinorrhea and sore throat.   Respiratory: Positive for cough, shortness of breath and wheezing.   Musculoskeletal: Positive for myalgias.  Neurological: Positive for headaches.  All other systems reviewed and are negative.      Objective:   Physical Exam Vitals signs reviewed.  Constitutional:      Appearance: She is well-developed. She is ill-appearing.  HENT:     Head: Normocephalic and atraumatic.     Right Ear: External ear normal.     Nose:     Right Turbinates: Swollen.     Left Turbinates: Swollen.     Mouth/Throat:     Pharynx: Pharyngeal swelling, oropharyngeal exudate and posterior oropharyngeal erythema present.  Eyes:     Pupils: Pupils are equal, round, and reactive to light.  Neck:     Musculoskeletal: Normal range of motion and neck supple.     Thyroid: No thyromegaly.  Cardiovascular:     Rate and Rhythm: Normal rate and regular rhythm.     Heart sounds: Normal heart sounds. No murmur.  Pulmonary:     Effort: Pulmonary effort is normal. No respiratory distress.   Breath sounds: Normal breath sounds. No wheezing.     Comments: Intermittent  nonproductive  cough Abdominal:     General: Bowel sounds are normal. There is no distension.     Palpations: Abdomen is soft.     Tenderness: There is no abdominal tenderness.  Musculoskeletal: Normal range of motion.        General: No tenderness.  Skin:    General: Skin is warm and dry.  Neurological:     Mental Status: She is alert and oriented to person, place, and time.     Cranial Nerves: No cranial nerve deficit.     Deep Tendon Reflexes: Reflexes are normal and symmetric.  Psychiatric:        Behavior: Behavior normal.        Thought Content: Thought content normal.        Judgment: Judgment normal.      BP 119/67   Pulse (!) 55   Temp 99.2 F (37.3 C)   Ht 4' 11.5" (1.511 m)   Wt 108 lb (49 kg)   BMI 21.45 kg/m      Assessment & Plan:  Stacy Dalton comes in today with chief complaint of Generalized Body Aches   Diagnosis and orders addressed:  1. Flu-like symptoms - Rapid Strep Screen ([facility] ONLY) - Veritor Flu A/B Waived  2. Exposure to influenza - oseltamivir (TAMIFLU) 75 MG capsule; Take 1 capsule (75 mg total)  by mouth 2 (two) times daily.  Dispense: 10 capsule; Refill: 0  3. Chronic obstructive pulmonary disease, unspecified COPD type (HCC) - doxycycline (VIBRA-TABS) 100 MG tablet; Take 1 tablet (100 mg total) by mouth 2 (two) times daily.  Dispense: 20 tablet; Refill: 0 - predniSONE (STERAPRED UNI-PAK 21 TAB) 10 MG (21) TBPK tablet; Use as directed  Dispense: 21 tablet; Refill: 0 - HYDROcodone-homatropine (HYCODAN) 5-1.5 MG/5ML syrup; Take 5 mLs by mouth every 8 (eight) hours as needed for cough.  Dispense: 240 mL; Refill: 0  4. Chronic cough - HYDROcodone-homatropine (HYCODAN) 5-1.5 MG/5ML syrup; Take 5 mLs by mouth every 8 (eight) hours as needed for cough.  Dispense: 240 mL; Refill: 0  Will treat for influenza  Force fluids Start antibiotic for  COPD RTO if symptoms worsen or do not improve   Evelina Dun, FNP

## 2018-06-24 NOTE — Patient Instructions (Signed)

## 2018-06-30 NOTE — Progress Notes (Deleted)
@Patient  ID: Stacy Dalton, female    DOB: 1951/04/11, 67 y.o.   MRN: [id number]  No chief complaint on file.   Referring provider: [name], FNP  HPI:  67 year old female former smoker initially referred to our office on 10/30/2017 for continued dyspnea, cough and management of COPD GOLD 0  PMH: Hypertension, hyperlipidemia Smoker/ Smoking History: Former smoker.  30 pack years.  Quit in 2000. Maintenance: Breo Ellipta Pt of: Dr. Vaughan Browner  Recent [facility] Encounters:     06/30/2018  - Visit   HPI  Tests:   CT abdomen pelvis 11/29/2015-visualized lung bases are clear Chest x-ray 08/23/2017-no acute cardiopulmonary abnormality  CBC 06/21/2017-WBC 7.3, eos 4%, absolute eosinophil count 300  10/30/2017-allergy panel-mold labs are normal 10/30/2017- respiratory allergy profile- not responsive, IgE 11 10/30/17-CBC with differential eosinophils relative 0.1, eosinophils absolute 0  12/19/2016-echocardiogram-LV ejection fraction 60 to 65%, PA peak pressures 33  FENO:  No results found for: NITRICOXIDE  PFT: PFT Results Latest Ref Rng & Units 03/14/2018  FVC-Pre L 2.60  FVC-Predicted Pre % 103  FVC-Post L 2.69  FVC-Predicted Post % 106  Pre FEV1/FVC % % 74  Post FEV1/FCV % % 77  FEV1-Pre L 1.91  FEV1-Predicted Pre % 100  FEV1-Post L 2.08  DLCO UNC% % 123  DLCO COR %Predicted % 119  TLC L 6.35  TLC % Predicted % 143  RV % Predicted % 160    Imaging: No results found.    Specialty Problems      Pulmonary Problems   COPD GOLD 0 with Asthmatic process    Cough      Allergies  Allergen Reactions  . Toradol [Ketorolac Tromethamine] Shortness Of Breath  . Butalbital-Apap-Caffeine Other (See Comments)    jittery  . Demerol Swelling  . Esgic [Butalbital-Apap-Caffeine] Other (See Comments)    jittery  . Iodine Other (See Comments)    bp bottomed out per pt several hours later; unsure if pre medicated in past with cm; done in [place]     Immunization History  Administered Date(s) Administered  . Influenza Whole 04/29/2012  . Influenza, High Dose Seasonal PF 05/21/2018  . Influenza,inj,Quad PF,6+ Mos 05/05/2013, 06/10/2014, 05/13/2015, 05/05/2016    Past Medical History:  Diagnosis Date  . COPD (chronic obstructive pulmonary disease) ([place])    told has copd, no current inhaler use  . Depression   . GERD (gastroesophageal reflux disease)   . Hypertension   . Hypothyroidism   . Insomnia   . Migraine   . Migraine   . Osteopenia   . Panic attacks     Tobacco History: Social History   Tobacco Use  Smoking Status Former Smoker  . Packs/day: 1.50  . Years: 20.00  . Pack years: 30.00  . Types: Cigarettes  . Last attempt to quit: 10/04/1998  . Years since quitting: 19.7  Smokeless Tobacco Never Used   Counseling given: Not Answered   Outpatient Encounter Medications as of 07/01/2018  Medication Sig  . albuterol (VENTOLIN HFA) 108 (90 Base) MCG/ACT inhaler Inhale into the lungs every 6 (six) hours as needed for wheezing or shortness of breath.  [name] amLODipine-olmesartan (AZOR) 10-40 MG tablet Take 1 tablet by mouth daily.  [name] atorvastatin (LIPITOR) 20 MG tablet Take 1 tablet (20 mg total) by mouth daily.  [name] doxycycline (VIBRA-TABS) 100 MG tablet Take 1 tablet (100 mg total) by mouth 2 (two) times daily.  . DULoxetine (CYMBALTA) 60 MG capsule Take 1 capsule (60  mg total) by mouth daily.  . famotidine (PEPCID) 20 MG tablet Take 1 tablet (20 mg total) by mouth daily. (Patient not taking: Reported on 05/23/2018)  . Fluticasone Furoate-Vilanterol (BREO ELLIPTA IN) Inhale 1 puff into the lungs daily.  . Fluticasone-Umeclidin-Vilant (TRELEGY ELLIPTA) 100-62.5-25 MCG/INH AEPB Inhale 1 puff into the lungs daily. (Patient not taking: Reported on 05/23/2018)  . HYDROcodone-homatropine (HYCODAN) 5-1.5 MG/5ML syrup Take 5 mLs by mouth every 8 (eight) hours as needed for cough.  . levothyroxine (SYNTHROID, LEVOTHROID) 25 MCG  tablet Take 1 tablet (25 mcg total) by mouth daily before breakfast.  . ondansetron (ZOFRAN-ODT) 4 MG disintegrating tablet DISSOLVE 1 TABLET(4 MG) ON THE TONGUE EVERY 8 HOURS AS NEEDED FOR NAUSEA OR VOMITING  . oseltamivir (TAMIFLU) 75 MG capsule Take 1 capsule (75 mg total) by mouth 2 (two) times daily.  . predniSONE (STERAPRED UNI-PAK 21 TAB) 10 MG (21) TBPK tablet Use as directed  . temazepam (RESTORIL) 30 MG capsule Take 1 capsule (30 mg total) by mouth at bedtime as needed for sleep.  . traMADol (ULTRAM) 50 MG tablet Take 2 tablets (100 mg total) by mouth every 8 (eight) hours as needed (pain).  . Vitamin D, Ergocalciferol, (DRISDOL) 1.25 MG (50000 UT) CAPS capsule TAKE 1 CAPSULE (50000 UNITS) BY MOUTH EVERY 7 DAYS   No facility-administered encounter medications on file as of 07/01/2018.      Review of Systems  Review of Systems   Physical Exam  There were no vitals taken for this visit.  Wt Readings from Last 5 Encounters:  06/24/18 108 lb (49 kg)  05/23/18 110 lb (49.9 kg)  05/21/18 109 lb 9.6 oz (49.7 kg)  03/14/18 116 lb 6.4 oz (52.8 kg)  03/07/18 116 lb (52.6 kg)     Physical Exam    Lab Results:  CBC    Component Value Date/Time   WBC 6.8 05/21/2018 1141   WBC 9.3 10/30/2017 1627   RBC 5.09 05/21/2018 1141   RBC 4.73 10/30/2017 1627   HGB 14.0 05/21/2018 1141   HCT 42.6 05/21/2018 1141   PLT 400 05/21/2018 1141   MCV 84 05/21/2018 1141   MCH 27.5 05/21/2018 1141   MCH 25.1 (L) 02/23/2017 0042   MCHC 32.9 05/21/2018 1141   MCHC 33.7 10/30/2017 1627   RDW 12.9 05/21/2018 1141   LYMPHSABS 1.0 05/21/2018 1141   MONOABS 0.5 10/30/2017 1627   EOSABS 0.1 05/21/2018 1141   BASOSABS 0.0 05/21/2018 1141    BMET    Component Value Date/Time   NA 141 05/21/2018 1141   K 3.6 05/21/2018 1141   CL 97 05/21/2018 1141   CO2 24 05/21/2018 1141   GLUCOSE 110 (H) 05/21/2018 1141   GLUCOSE 111 (H) 02/24/2017 0640   BUN 15 05/21/2018 1141   CREATININE 1.01 (H)  05/21/2018 1141   CREATININE 0.79 11/06/2012 0851   CALCIUM 10.2 05/21/2018 1141   GFRNONAA 57 (L) 05/21/2018 1141   GFRAA 66 05/21/2018 1141    BNP No results found for: BNP  ProBNP    Component Value Date/Time   PROBNP 1,366.0 (H) 10/08/2011 1532      Assessment & Plan:     No problem-specific Assessment & Plan notes found for this encounter.     Lauraine Rinne, NP 06/30/2018   This appointment was *** with over 50% of the time in direct face-to-face patient care, assessment, plan of care, and follow-up.

## 2018-07-01 ENCOUNTER — Ambulatory Visit: Payer: Medicare Other | Admitting: Pulmonary Disease

## 2018-07-04 ENCOUNTER — Other Ambulatory Visit: Payer: Self-pay | Admitting: *Deleted

## 2018-07-04 DIAGNOSIS — G8929 Other chronic pain: Secondary | ICD-10-CM

## 2018-07-04 DIAGNOSIS — M546 Pain in thoracic spine: Principal | ICD-10-CM

## 2018-07-04 MED ORDER — ONDANSETRON 4 MG PO TBDP: ORAL_TABLET | ORAL | 0 refills | Status: DC

## 2018-07-16 ENCOUNTER — Other Ambulatory Visit: Payer: Self-pay | Admitting: Family

## 2018-07-16 DIAGNOSIS — J449 Chronic obstructive pulmonary disease, unspecified: Secondary | ICD-10-CM

## 2018-07-16 DIAGNOSIS — R053 Chronic cough: Secondary | ICD-10-CM

## 2018-07-16 DIAGNOSIS — R05 Cough: Secondary | ICD-10-CM

## 2018-07-16 NOTE — Telephone Encounter (Signed)
PT is needing a refill on HYDROcodone-homatropine (HYCODAN) 5-1.5 MG/5ML syrup   Pharmacy [facility]

## 2018-07-17 NOTE — Telephone Encounter (Signed)
Pt rc about refill message she left on 07/16/2018

## 2018-07-18 ENCOUNTER — Telehealth: Payer: Self-pay | Admitting: Family

## 2018-07-18 DIAGNOSIS — R053 Chronic cough: Secondary | ICD-10-CM

## 2018-07-18 DIAGNOSIS — J449 Chronic obstructive pulmonary disease, unspecified: Secondary | ICD-10-CM

## 2018-07-18 DIAGNOSIS — R05 Cough: Secondary | ICD-10-CM

## 2018-07-18 MED ORDER — HYDROCODONE-HOMATROPINE 5-1.5 MG/5ML PO SYRP: 5.0000 mL | ORAL_SOLUTION | Freq: Three times a day (TID) | ORAL | 0 refills | Status: DC | PRN

## 2018-07-18 NOTE — Telephone Encounter (Signed)
Attempted to contact patient - line busy 

## 2018-07-18 NOTE — Telephone Encounter (Signed)
Pt has called back again about the rx request from tuesday

## 2018-07-18 NOTE — Addendum Note (Signed)
Addended by: Evelina Dun A on: 07/18/2018 01:48 PM   Modules accepted: Orders

## 2018-07-18 NOTE — Telephone Encounter (Signed)
Prescription sent to pharmacy.

## 2018-07-18 NOTE — Telephone Encounter (Signed)
Script went to mail order. Nurse will cancel and need provider to send script in again to [place], [place].  Thanks

## 2018-07-20 ENCOUNTER — Emergency Department (HOSPITAL_COMMUNITY): Payer: Medicare Other

## 2018-07-20 ENCOUNTER — Inpatient Hospital Stay (HOSPITAL_COMMUNITY)
Admission: EM | Admit: 2018-07-20 | Discharge: 2018-07-24 | DRG: 389 | Disposition: A | Discharge status: Home / Self Care | Payer: Medicare Other | Attending: Family Medicine | Admitting: Family Medicine

## 2018-07-20 ENCOUNTER — Other Ambulatory Visit: Payer: Self-pay

## 2018-07-20 ENCOUNTER — Encounter (HOSPITAL_COMMUNITY): Payer: Self-pay | Admitting: Emergency Medicine

## 2018-07-20 DIAGNOSIS — Z8349 Family history of other endocrine, nutritional and metabolic diseases: Secondary | ICD-10-CM | POA: Diagnosis not present

## 2018-07-20 DIAGNOSIS — J449 Chronic obstructive pulmonary disease, unspecified: Secondary | ICD-10-CM | POA: Diagnosis present

## 2018-07-20 DIAGNOSIS — F329 Major depressive disorder, single episode, unspecified: Secondary | ICD-10-CM | POA: Diagnosis present

## 2018-07-20 DIAGNOSIS — G4700 Insomnia, unspecified: Secondary | ICD-10-CM | POA: Diagnosis present

## 2018-07-20 DIAGNOSIS — K56609 Unspecified intestinal obstruction, unspecified as to partial versus complete obstruction: Secondary | ICD-10-CM | POA: Diagnosis present

## 2018-07-20 DIAGNOSIS — F410 Panic disorder [episodic paroxysmal anxiety] without agoraphobia: Secondary | ICD-10-CM | POA: Diagnosis present

## 2018-07-20 DIAGNOSIS — Z833 Family history of diabetes mellitus: Secondary | ICD-10-CM | POA: Diagnosis not present

## 2018-07-20 DIAGNOSIS — N183 Chronic kidney disease, stage 3 (moderate): Secondary | ICD-10-CM | POA: Diagnosis present

## 2018-07-20 DIAGNOSIS — Z800 Family history of malignant neoplasm of digestive organs: Secondary | ICD-10-CM | POA: Diagnosis not present

## 2018-07-20 DIAGNOSIS — Z87891 Personal history of nicotine dependence: Secondary | ICD-10-CM | POA: Diagnosis not present

## 2018-07-20 DIAGNOSIS — I959 Hypotension, unspecified: Secondary | ICD-10-CM | POA: Diagnosis not present

## 2018-07-20 DIAGNOSIS — Z888 Allergy status to other drugs, medicaments and biological substances status: Secondary | ICD-10-CM | POA: Diagnosis not present

## 2018-07-20 DIAGNOSIS — E876 Hypokalemia: Secondary | ICD-10-CM | POA: Diagnosis not present

## 2018-07-20 DIAGNOSIS — Z823 Family history of stroke: Secondary | ICD-10-CM

## 2018-07-20 DIAGNOSIS — E039 Hypothyroidism, unspecified: Secondary | ICD-10-CM | POA: Diagnosis present

## 2018-07-20 DIAGNOSIS — Z885 Allergy status to narcotic agent status: Secondary | ICD-10-CM

## 2018-07-20 DIAGNOSIS — G8929 Other chronic pain: Secondary | ICD-10-CM | POA: Diagnosis present

## 2018-07-20 DIAGNOSIS — E785 Hyperlipidemia, unspecified: Secondary | ICD-10-CM | POA: Diagnosis present

## 2018-07-20 DIAGNOSIS — F419 Anxiety disorder, unspecified: Secondary | ICD-10-CM | POA: Diagnosis present

## 2018-07-20 DIAGNOSIS — Z0289 Encounter for other administrative examinations: Secondary | ICD-10-CM

## 2018-07-20 DIAGNOSIS — R109 Unspecified abdominal pain: Secondary | ICD-10-CM | POA: Diagnosis present

## 2018-07-20 DIAGNOSIS — R112 Nausea with vomiting, unspecified: Secondary | ICD-10-CM

## 2018-07-20 DIAGNOSIS — M8580 Other specified disorders of bone density and structure, unspecified site: Secondary | ICD-10-CM | POA: Diagnosis present

## 2018-07-20 DIAGNOSIS — K56600 Partial intestinal obstruction, unspecified as to cause: Secondary | ICD-10-CM

## 2018-07-20 DIAGNOSIS — I10 Essential (primary) hypertension: Secondary | ICD-10-CM | POA: Diagnosis present

## 2018-07-20 DIAGNOSIS — Z8249 Family history of ischemic heart disease and other diseases of the circulatory system: Secondary | ICD-10-CM | POA: Diagnosis not present

## 2018-07-20 DIAGNOSIS — Z90710 Acquired absence of both cervix and uterus: Secondary | ICD-10-CM

## 2018-07-20 DIAGNOSIS — K5651 Intestinal adhesions [bands], with partial obstruction: Principal | ICD-10-CM | POA: Diagnosis present

## 2018-07-20 DIAGNOSIS — M170 Bilateral primary osteoarthritis of knee: Secondary | ICD-10-CM | POA: Diagnosis present

## 2018-07-20 DIAGNOSIS — Z825 Family history of asthma and other chronic lower respiratory diseases: Secondary | ICD-10-CM | POA: Diagnosis not present

## 2018-07-20 DIAGNOSIS — K219 Gastro-esophageal reflux disease without esophagitis: Secondary | ICD-10-CM | POA: Diagnosis present

## 2018-07-20 DIAGNOSIS — R188 Other ascites: Secondary | ICD-10-CM | POA: Diagnosis not present

## 2018-07-20 DIAGNOSIS — Z4659 Encounter for fitting and adjustment of other gastrointestinal appliance and device: Secondary | ICD-10-CM

## 2018-07-20 DIAGNOSIS — F1120 Opioid dependence, uncomplicated: Secondary | ICD-10-CM

## 2018-07-20 DIAGNOSIS — Z79890 Hormone replacement therapy (postmenopausal): Secondary | ICD-10-CM

## 2018-07-20 DIAGNOSIS — R52 Pain, unspecified: Secondary | ICD-10-CM | POA: Diagnosis not present

## 2018-07-20 DIAGNOSIS — R0902 Hypoxemia: Secondary | ICD-10-CM | POA: Diagnosis not present

## 2018-07-20 DIAGNOSIS — J9811 Atelectasis: Secondary | ICD-10-CM | POA: Diagnosis present

## 2018-07-20 DIAGNOSIS — I129 Hypertensive chronic kidney disease with stage 1 through stage 4 chronic kidney disease, or unspecified chronic kidney disease: Secondary | ICD-10-CM | POA: Diagnosis present

## 2018-07-20 DIAGNOSIS — R1084 Generalized abdominal pain: Secondary | ICD-10-CM | POA: Diagnosis not present

## 2018-07-20 DIAGNOSIS — K5669 Other partial intestinal obstruction: Secondary | ICD-10-CM | POA: Diagnosis not present

## 2018-07-20 DIAGNOSIS — Z79899 Other long term (current) drug therapy: Secondary | ICD-10-CM

## 2018-07-20 DIAGNOSIS — Z7982 Long term (current) use of aspirin: Secondary | ICD-10-CM

## 2018-07-20 DIAGNOSIS — Z79891 Long term (current) use of opiate analgesic: Secondary | ICD-10-CM

## 2018-07-20 DIAGNOSIS — M546 Pain in thoracic spine: Secondary | ICD-10-CM

## 2018-07-20 DIAGNOSIS — Z5329 Procedure and treatment not carried out because of patient's decision for other reasons: Secondary | ICD-10-CM | POA: Diagnosis present

## 2018-07-20 DIAGNOSIS — G894 Chronic pain syndrome: Secondary | ICD-10-CM | POA: Diagnosis present

## 2018-07-20 DIAGNOSIS — Z4682 Encounter for fitting and adjustment of non-vascular catheter: Secondary | ICD-10-CM | POA: Diagnosis not present

## 2018-07-20 HISTORY — DX: Cough, unspecified: R05.9

## 2018-07-20 HISTORY — DX: Pain, unspecified: R52

## 2018-07-20 HISTORY — DX: Unspecified intestinal obstruction, unspecified as to partial versus complete obstruction: K56.609

## 2018-07-20 HISTORY — DX: Other chronic pain: G89.29

## 2018-07-20 HISTORY — DX: Cough: R05

## 2018-07-20 LAB — CBC WITH DIFFERENTIAL/PLATELET
Abs Immature Granulocytes: 0.04 10*3/uL (ref 0.00–0.07)
Basophils Absolute: 0 10*3/uL (ref 0.0–0.1)
Basophils Relative: 0 %
Eosinophils Absolute: 0.1 10*3/uL (ref 0.0–0.5)
Eosinophils Relative: 1 %
HCT: 42.3 % (ref 36.0–46.0)
Hemoglobin: 13.5 g/dL (ref 12.0–15.0)
Immature Granulocytes: 0 %
Lymphocytes Relative: 5 %
Lymphs Abs: 0.6 10*3/uL — ABNORMAL LOW (ref 0.7–4.0)
MCH: 27.4 pg (ref 26.0–34.0)
MCHC: 31.9 g/dL (ref 30.0–36.0)
MCV: 86 fL (ref 80.0–100.0)
Monocytes Absolute: 0.6 10*3/uL (ref 0.1–1.0)
Monocytes Relative: 5 %
Neutro Abs: 10.4 10*3/uL — ABNORMAL HIGH (ref 1.7–7.7)
Neutrophils Relative %: 89 %
Platelets: 407 10*3/uL — ABNORMAL HIGH (ref 150–400)
RBC: 4.92 MIL/uL (ref 3.87–5.11)
RDW: 13.6 % (ref 11.5–15.5)
WBC: 11.7 10*3/uL — ABNORMAL HIGH (ref 4.0–10.5)
nRBC: 0 % (ref 0.0–0.2)

## 2018-07-20 LAB — URINALYSIS, ROUTINE W REFLEX MICROSCOPIC
Bilirubin Urine: NEGATIVE
Glucose, UA: NEGATIVE mg/dL
Hgb urine dipstick: NEGATIVE
Ketones, ur: 5 mg/dL — AB
Nitrite: NEGATIVE
Protein, ur: NEGATIVE mg/dL
Specific Gravity, Urine: 1.024 (ref 1.005–1.030)
pH: 5 (ref 5.0–8.0)

## 2018-07-20 LAB — COMPREHENSIVE METABOLIC PANEL
ALT: 13 U/L (ref 0–44)
AST: 21 U/L (ref 15–41)
Albumin: 4 g/dL (ref 3.5–5.0)
Alkaline Phosphatase: 93 U/L (ref 38–126)
Anion gap: 13 (ref 5–15)
BUN: 17 mg/dL (ref 8–23)
CO2: 30 mmol/L (ref 22–32)
Calcium: 9.4 mg/dL (ref 8.9–10.3)
Chloride: 101 mmol/L (ref 98–111)
Creatinine, Ser: 1.02 mg/dL — ABNORMAL HIGH (ref 0.44–1.00)
GFR calc Af Amer: >60 mL/min (ref >60)
GFR calc non Af Amer: 56 mL/min — ABNORMAL LOW (ref >60)
Glucose, Bld: 151 mg/dL — ABNORMAL HIGH (ref 70–99)
Potassium: 2.8 mmol/L — ABNORMAL LOW (ref 3.5–5.1)
Sodium: 144 mmol/L (ref 135–145)
Total Bilirubin: 0.4 mg/dL (ref 0.3–1.2)
Total Protein: 7.3 g/dL (ref 6.5–8.1)

## 2018-07-20 LAB — LACTIC ACID, PLASMA
Lactic Acid, Venous: 0.9 mmol/L (ref 0.5–1.9)
Lactic Acid, Venous: 0.8 mmol/L (ref 0.5–1.9)

## 2018-07-20 LAB — LIPASE, BLOOD: Lipase: 16 U/L (ref 11–51)

## 2018-07-20 LAB — MAGNESIUM: Magnesium: 0.9 mg/dL — CL (ref 1.7–2.4)

## 2018-07-20 LAB — TROPONIN I: Troponin I: <0.03 ng/mL (ref <0.03)

## 2018-07-20 MED ORDER — MORPHINE SULFATE (PF) 2 MG/ML IV SOLN
1.0000 mg | INTRAVENOUS | Status: DC | PRN
  Administered 2018-07-20 – 2018-07-21 (×8): 1 mg via INTRAVENOUS
  Filled 2018-07-20 (×8): qty 1

## 2018-07-20 MED ORDER — BISACODYL 10 MG RE SUPP
10.0000 mg | Freq: Once | RECTAL | Status: AC
  Administered 2018-07-20: 10 mg via RECTAL
  Filled 2018-07-20: qty 1

## 2018-07-20 MED ORDER — SODIUM CHLORIDE 0.9 % IV SOLN
INTRAVENOUS | Status: DC
  Administered 2018-07-20: 08:00:00 via INTRAVENOUS

## 2018-07-20 MED ORDER — POTASSIUM CHLORIDE 10 MEQ/100ML IV SOLN
10.0000 meq | INTRAVENOUS | Status: AC
  Administered 2018-07-20 (×2): 10 meq via INTRAVENOUS
  Filled 2018-07-20 (×2): qty 100

## 2018-07-20 MED ORDER — MORPHINE SULFATE (PF) 4 MG/ML IV SOLN
4.0000 mg | INTRAVENOUS | Status: AC | PRN
  Administered 2018-07-20 (×2): 4 mg via INTRAVENOUS
  Filled 2018-07-20 (×2): qty 1

## 2018-07-20 MED ORDER — ONDANSETRON HCL 4 MG/2ML IJ SOLN
4.0000 mg | INTRAMUSCULAR | Status: AC | PRN
  Administered 2018-07-20 (×2): 4 mg via INTRAVENOUS
  Filled 2018-07-20 (×2): qty 2

## 2018-07-20 MED ORDER — FLUTICASONE FUROATE-VILANTEROL 100-25 MCG/INH IN AEPB
1.0000 | INHALATION_SPRAY | Freq: Every day | RESPIRATORY_TRACT | Status: DC
  Administered 2018-07-21 – 2018-07-24 (×4): 1 via RESPIRATORY_TRACT
  Filled 2018-07-20: qty 28

## 2018-07-20 MED ORDER — ONDANSETRON HCL 4 MG PO TABS
4.0000 mg | ORAL_TABLET | Freq: Four times a day (QID) | ORAL | Status: DC | PRN
  Administered 2018-07-20 – 2018-07-24 (×4): 4 mg via ORAL
  Filled 2018-07-20 (×4): qty 1

## 2018-07-20 MED ORDER — ACETAMINOPHEN 650 MG RE SUPP: 650.0000 mg | Freq: Four times a day (QID) | RECTAL | Status: DC | PRN

## 2018-07-20 MED ORDER — POTASSIUM CHLORIDE IN NACL 40-0.9 MEQ/L-% IV SOLN
INTRAVENOUS | Status: DC
  Administered 2018-07-20 – 2018-07-21 (×2): 75 mL/h via INTRAVENOUS
  Filled 2018-07-20 (×4): qty 1000

## 2018-07-20 MED ORDER — MAGNESIUM SULFATE 2 GM/50ML IV SOLN
2.0000 g | Freq: Once | INTRAVENOUS | Status: AC
  Administered 2018-07-20: 2 g via INTRAVENOUS
  Filled 2018-07-20: qty 50

## 2018-07-20 MED ORDER — HYDRALAZINE HCL 20 MG/ML IJ SOLN: 10.0000 mg | INTRAMUSCULAR | Status: DC | PRN

## 2018-07-20 MED ORDER — ENOXAPARIN SODIUM 40 MG/0.4ML ~~LOC~~ SOLN
40.0000 mg | SUBCUTANEOUS | Status: DC
  Administered 2018-07-20 – 2018-07-23 (×4): 40 mg via SUBCUTANEOUS
  Filled 2018-07-20 (×5): qty 0.4

## 2018-07-20 MED ORDER — LORAZEPAM 2 MG/ML IJ SOLN
0.5000 mg | Freq: Four times a day (QID) | INTRAMUSCULAR | Status: DC | PRN
  Administered 2018-07-20 – 2018-07-22 (×6): 0.5 mg via INTRAVENOUS
  Filled 2018-07-20 (×6): qty 1

## 2018-07-20 MED ORDER — ACETAMINOPHEN 325 MG PO TABS
650.0000 mg | ORAL_TABLET | Freq: Four times a day (QID) | ORAL | Status: DC | PRN
  Administered 2018-07-20: 650 mg via ORAL
  Filled 2018-07-20: qty 2

## 2018-07-20 MED ORDER — ONDANSETRON HCL 4 MG/2ML IJ SOLN
4.0000 mg | Freq: Four times a day (QID) | INTRAMUSCULAR | Status: DC | PRN
  Administered 2018-07-21 – 2018-07-23 (×7): 4 mg via INTRAVENOUS
  Filled 2018-07-20 (×7): qty 2

## 2018-07-20 MED ORDER — LEVOTHYROXINE SODIUM 100 MCG/5ML IV SOLN
12.5000 ug | Freq: Every day | INTRAVENOUS | Status: DC
  Administered 2018-07-20 – 2018-07-21 (×2): 12.5 ug via INTRAVENOUS
  Filled 2018-07-20 (×5): qty 5

## 2018-07-20 NOTE — ED Notes (Signed)
Pt in X ray

## 2018-07-20 NOTE — ED Triage Notes (Signed)
Pt states she has been having abdominal pain since yesterday with constant nausea and vomiting x 1 this morning.  Is concerned it could be another bowel obstruction.

## 2018-07-20 NOTE — ED Notes (Signed)
Admission delay due to inability to give report

## 2018-07-20 NOTE — ED Notes (Signed)
CRITICAL VALUE ALERT  Critical Value:  Mag 0.9  Date & Time Notied:  07/20/2018  Provider Notified: Dr Thurnell Garbe  Orders Received/Actions taken: see new orders.

## 2018-07-20 NOTE — ED Notes (Signed)
Bed is assigned  Awaiting bed ready

## 2018-07-20 NOTE — ED Notes (Signed)
Report to Daniel, RN

## 2018-07-20 NOTE — Consult Note (Signed)
[facility] Consult  Reason for Consult: SBO  Referring Physician: Dr. Thurnell Garbe   Chief Complaint    Abdominal Pain      Stacy Dalton is a 68 y.o. female.  HPI: Stacy Dalton is a 68 yo who came in with abdominal pain that was acute in onset and some nausea/vomiting. She cannot recall when she had her last BM. She has had multiple prior emergency abdominal surgeries for stercoral ulcers and bowel perforations, and felt that she was having pain similar to these episodes. She says that her pain is sharp and is more generalized and has worsened over the course of the day.   She denied any fever, chills, or blood in her stool or melena.    Past Medical History:  Diagnosis Date  . Chronic pain   . COPD (chronic obstructive pulmonary disease) ([facility])    told has copd, no current inhaler use  . Cough   . Depression   . GERD (gastroesophageal reflux disease)   . Hypertension   . Hypothyroidism   . Insomnia   . Migraine   . Migraine   . Osteopenia   . Pain management   . Panic attacks   . Small bowel obstruction [facility])     Past Surgical History:  Procedure Laterality Date  . ABDOMINAL HYSTERECTOMY    . COLON SURGERY    . COLOSTOMY CLOSURE  04/19/2012   Procedure: COLOSTOMY CLOSURE;  Surgeon: Adin Hector, MD;  Location: [facility];  Service: General;  Laterality: N/A;  Laparotomy, Resection and Closure of Colostomy  . COLOSTOMY TAKEDOWN N/A 04/12/2016   Procedure: [place] TAKEDOWN;  Surgeon: Johnathan Hausen, MD;  Location: [facility];  Service: General;  Laterality: N/A;  . INCONTINENCE SURGERY    . LAPAROTOMY  10/04/2011, colostomy also   Procedure: EXPLORATORY LAPAROTOMY;  Surgeon: Adin Hector, MD;  Location: [facility];  Service: General;  Laterality: N/A;  left partial colectomy with colostomy  . LAPAROTOMY  04/19/2012   Procedure: EXPLORATORY LAPAROTOMY;  Surgeon: Adin Hector, MD;  Location: [facility];  Service: General;  Laterality: N/A;  . LAPAROTOMY N/A  11/29/2015   Procedure: EXPLORATORY LAPAROTOMY; SUBTOTAL COLECTOMY WITH HARTMAN PROCEDURE AND END COLOSTOMY;  Surgeon: Johnathan Hausen, MD;  Location: [facility];  Service: General;  Laterality: N/A;  . TUBAL LIGATION    . VENTRAL HERNIA REPAIR  04/19/2012   Procedure: HERNIA REPAIR VENTRAL ADULT;  Surgeon: Adin Hector, MD;  Location: [facility];  Service: General;  Laterality: N/A;    Family History  Problem Relation Age of Onset  . Heart disease Mother   . Hypertension Mother   . Cancer Sister        sinus  . Diabetes Brother   . Heart disease Brother   . Thyroid disease Brother   . Cancer Sister        abdominal ?  . Thyroid disease Sister   . Cancer Other        GE junction adenocarcinoma  . Emphysema Father   . Stroke Father   . Hypertension Father   . Heart disease Father   . COPD Sister   . Thyroid disease Sister   . Thyroid disease Sister   . Diabetes Brother   . Thyroid disease Brother   . Thyroid disease Brother   . Hypertension Brother   . Post-traumatic stress disorder Brother   . COPD Brother   . Thyroid disease Brother   . Thyroid disease Brother   .  Hypertension Brother   . Transient ischemic attack Brother     Social History   Tobacco Use  . Smoking status: Former Smoker    Packs/day: 1.50    Years: 20.00    Pack years: 30.00    Types: Cigarettes    Last attempt to quit: 10/04/1998    Years since quitting: 19.8  . Smokeless tobacco: Never Used  Substance Use Topics  . Alcohol use: No  . Drug use: No    Medications:  I have reviewed the patient's current medications. Prior to Admission:  Medications Prior to Admission  Medication Sig Dispense Refill Last Dose  . albuterol (VENTOLIN HFA) 108 (90 Base) MCG/ACT inhaler Inhale 2 puffs into the lungs every 6 (six) hours as needed for wheezing or shortness of breath.    Past Month at Unknown time  . amLODipine-olmesartan (AZOR) 10-40 MG tablet Take 1 tablet by mouth daily. 90 tablet 1 Past Week at  Unknown time  . Aspirin-Salicylamide-Caffeine (BC HEADACHE POWDER PO) Take 1 Dose by mouth daily as needed.     [name] atorvastatin (LIPITOR) 20 MG tablet Take 1 tablet (20 mg total) by mouth daily. 30 tablet 11 07/18/2018  . DULoxetine (CYMBALTA) 60 MG capsule Take 1 capsule (60 mg total) by mouth daily. 90 capsule 3 07/18/2018  . Fluticasone Furoate-Vilanterol (BREO ELLIPTA IN) Inhale 1 puff into the lungs daily.   Past Week at Unknown time  . HYDROcodone-homatropine (HYCODAN) 5-1.5 MG/5ML syrup Take 5 mLs by mouth every 8 (eight) hours as needed for cough. 240 mL 0 07/19/2018 at Unknown time  . levothyroxine (SYNTHROID, LEVOTHROID) 25 MCG tablet Take 1 tablet (25 mcg total) by mouth daily before breakfast. 90 tablet 1 Past Week at Unknown time  . ondansetron (ZOFRAN-ODT) 4 MG disintegrating tablet DISSOLVE 1 TABLET(4 MG) ON THE TONGUE EVERY 8 HOURS AS NEEDED FOR NAUSEA OR VOMITING (Patient taking differently: Take 4 mg by mouth every 8 (eight) hours as needed for nausea. DISSOLVE 1 TABLET(4 MG) ON THE TONGUE EVERY 8 HOURS AS NEEDED FOR NAUSEA OR VOMITING) 90 tablet 0 07/19/2018 at Unknown time  . temazepam (RESTORIL) 30 MG capsule Take 1 capsule (30 mg total) by mouth at bedtime as needed for sleep. 30 capsule 5 Past Month at Unknown time  . traMADol (ULTRAM) 50 MG tablet Take 2 tablets (100 mg total) by mouth every 8 (eight) hours as needed (pain). (Patient taking differently: Take 50 mg by mouth every 8 (eight) hours as needed (pain). ) 180 tablet 2 Past Week at Unknown time  . Vitamin D, Ergocalciferol, (DRISDOL) 1.25 MG (50000 UT) CAPS capsule TAKE 1 CAPSULE (50000 UNITS) BY MOUTH EVERY 7 DAYS (Patient taking differently: Take 50,000 Units by mouth. TAKE 1 CAPSULE (50000 UNITS) BY MOUTH EVERY 7 DAYS) 12 capsule 3 Past Week at Unknown time  . doxycycline (VIBRA-TABS) 100 MG tablet Take 1 tablet (100 mg total) by mouth 2 (two) times daily. (Patient not taking: Reported on 07/20/2018) 20 tablet 0 Not Taking at  Unknown time  . famotidine (PEPCID) 20 MG tablet Take 1 tablet (20 mg total) by mouth daily. (Patient not taking: Reported on 05/23/2018) 30 tablet 4 Not Taking at Unknown time  . oseltamivir (TAMIFLU) 75 MG capsule Take 1 capsule (75 mg total) by mouth 2 (two) times daily. (Patient not taking: Reported on 07/20/2018) 10 capsule 0 Not Taking at Unknown time   Scheduled: . enoxaparin (LOVENOX) injection  40 mg Subcutaneous Q24H  . fluticasone furoate-vilanterol  1  puff Inhalation Daily  . levothyroxine  12.5 mcg Intravenous Daily   Continuous: . 0.9 % NaCl with KCl 40 mEq / L 75 mL/hr at 07/20/18 1552   SNK:[id number] **OR** acetaminophen, hydrALAZINE, LORazepam, morphine injection, ondansetron **OR** ondansetron (ZOFRAN) IV  Allergies  Allergen Reactions  . Toradol [Ketorolac Tromethamine] Shortness Of Breath  . Butalbital-Apap-Caffeine Other (See Comments)    jittery  . Demerol Swelling  . Esgic [Butalbital-Apap-Caffeine] Other (See Comments)    jittery  . Iodine Other (See Comments)    bp bottomed out per pt several hours later; unsure if pre medicated in past with cm; [facility]     ROS: A comprehensive review of systems was negative except for: Gastrointestinal: positive for abdominal pain, constipation, nausea and vomiting  Blood pressure (!) 162/73, pulse 75, temperature 98.5 F (36.9 C), temperature source Oral, resp. rate 10, height 5\' 1"  (1.549 m), weight 49.4 kg, SpO2 96 %. Physical Exam Vitals signs reviewed.  Constitutional:      Appearance: She is well-developed.  HENT:     Head: Normocephalic and atraumatic.  Eyes:     Extraocular Movements: Extraocular movements intact.  Cardiovascular:     Rate and Rhythm: Normal rate and regular rhythm.  Pulmonary:     Effort: Pulmonary effort is normal.     Breath sounds: Normal breath sounds.  Abdominal:     Palpations: Abdomen is soft.     Tenderness: There is generalized abdominal tenderness. There is no  guarding or rebound.  Musculoskeletal:     Right lower leg: No edema.     Left lower leg: No edema.  Skin:    General: Skin is warm and dry.  Neurological:     General: No focal deficit present.     Mental Status: She is alert and oriented to person, place, and time.  Psychiatric:        Mood and Affect: Mood normal.        Behavior: Behavior normal.     Results: Results for orders placed or performed during the hospital encounter of 07/20/18 (from the past 48 hour(s))  Comprehensive metabolic panel     Status: Abnormal   Collection Time: 07/20/18  7:43 AM  Result Value Ref Range   Sodium 144 135 - 145 mmol/L   Potassium 2.8 (L) 3.5 - 5.1 mmol/L   Chloride 101 98 - 111 mmol/L   CO2 30 22 - 32 mmol/L   Glucose, Bld 151 (H) 70 - 99 mg/dL   BUN 17 8 - 23 mg/dL   Creatinine, Ser 1.02 (H) 0.44 - 1.00 mg/dL   Calcium 9.4 8.9 - 10.3 mg/dL   Total Protein 7.3 6.5 - 8.1 g/dL   Albumin 4.0 3.5 - 5.0 g/dL   AST 21 15 - 41 U/L   ALT 13 0 - 44 U/L   Alkaline Phosphatase 93 38 - 126 U/L   Total Bilirubin 0.4 0.3 - 1.2 mg/dL   GFR calc non Af Amer 56 (L) >60 mL/min   GFR calc Af Amer >60 >60 mL/min   Anion gap 13 5 - 15    Comment: Performed at [facility], [address] [place], [place] [address]  Lipase, blood     Status: None   Collection Time: 07/20/18  7:43 AM  Result Value Ref Range   Lipase 16 11 - 51 U/L    Comment: Performed at [facility], [address] [place], [place] [address]  Troponin I - Once  Status: None   Collection Time: 07/20/18  7:43 AM  Result Value Ref Range   Troponin I <0.03 <0.03 ng/mL    Comment: Performed at [facility], [address], [place], [place] [address]  Lactic acid, plasma     Status: None   Collection Time: 07/20/18  7:43 AM  Result Value Ref Range   Lactic Acid, Venous 0.9 0.5 - 1.9 mmol/L    Comment: Performed at [facility], [address], [place], [place] [address]  CBC with Differential     Status: Abnormal   Collection  Time: 07/20/18  7:43 AM  Result Value Ref Range   WBC 11.7 (H) 4.0 - 10.5 K/uL   RBC 4.92 3.87 - 5.11 MIL/uL   Hemoglobin 13.5 12.0 - 15.0 g/dL   HCT 42.3 36.0 - 46.0 %   MCV 86.0 80.0 - 100.0 fL   MCH 27.4 26.0 - 34.0 pg   MCHC 31.9 30.0 - 36.0 g/dL   RDW 13.6 11.5 - 15.5 %   Platelets 407 (H) 150 - 400 K/uL   nRBC 0.0 0.0 - 0.2 %   Neutrophils Relative % 89 %   Neutro Abs 10.4 (H) 1.7 - 7.7 K/uL   Lymphocytes Relative 5 %   Lymphs Abs 0.6 (L) 0.7 - 4.0 K/uL   Monocytes Relative 5 %   Monocytes Absolute 0.6 0.1 - 1.0 K/uL   Eosinophils Relative 1 %   Eosinophils Absolute 0.1 0.0 - 0.5 K/uL   Basophils Relative 0 %   Basophils Absolute 0.0 0.0 - 0.1 K/uL   Immature Granulocytes 0 %   Abs Immature Granulocytes 0.04 0.00 - 0.07 K/uL    Comment: Performed at [facility], [address], [place], [place] [address]  Magnesium     Status: Abnormal   Collection Time: 07/20/18  7:43 AM  Result Value Ref Range   Magnesium 0.9 (LL) 1.7 - 2.4 mg/dL    Comment: CRITICAL RESULT CALLED TO, READ BACK BY AND VERIFIED WITH: MARTIN D. @ 0925 ON [id number] BY HENDERSON L. Performed at [facility], [address] [place], [place] [address]   Lactic acid, plasma     Status: None   Collection Time: 07/20/18 10:06 AM  Result Value Ref Range   Lactic Acid, Venous 0.8 0.5 - 1.9 mmol/L    Comment: Performed at [facility], [address], [place], [place] [address]   Personally reviewed- difficult to follow bowel due to lack of contrast- colon decompressed and area of transition right of midline, no free air or thickening; stool in rectal vault   Ct Abdomen Pelvis Wo Contrast  Result Date: 07/20/2018 CLINICAL DATA:  Per ED note. Pt states she has been having abdominal pain since yesterday with constant nausea and vomiting x 1 this morning. Is concerned it could be another bowel obstruction. Hx of COPD,GERD,HTN, SBO, Multiple bowel surgeries including hernia repair,colostomy and colostomy closure.  EXAM: CT ABDOMEN AND PELVIS WITHOUT CONTRAST TECHNIQUE: Multidetector CT imaging of the abdomen and pelvis was performed following the standard protocol without IV contrast. COMPARISON:  11/29/2015 FINDINGS: Lower chest: No acute abnormality. Hepatobiliary: No focal liver abnormality is seen. No gallstones, gallbladder wall thickening, or biliary dilatation. Pancreas: Unremarkable. No pancreatic ductal dilatation or surrounding inflammatory changes. Spleen: Normal in size without focal abnormality. Adrenals/Urinary Tract: Adrenal glands are unremarkable. Kidneys are normal, without renal calculi, focal lesion, or hydronephrosis. Bladder is unremarkable. Stomach/Bowel: The small bowel is dilated with air-fluid levels, maximum dilation of 3.8 cm.  There is an anastomosis staple line along sigmoid colon, with this portion the colon mildly distended with stool. Colon extends superior to this along the mid abdomen towards the right upper quadrant. In the mid abdomen, just to the right of midline, there is an apparent transition site. There is no bowel thickening. There is a small amount ascites collecting adjacent to the liver and spleen and the small bowel mesentery and posterior pelvic recess. Vascular/Lymphatic: Aortic atherosclerosis. No aneurysm. No lymphadenopathy. Reproductive: Status post hysterectomy. No adnexal masses. Other: No abdominal wall hernia. Musculoskeletal: No fracture or acute finding. No osteoblastic or osteolytic lesions. IMPRESSION: 1. Partial bowel obstruction. The site of obstruction appears to be the residual colon in the right mid abdomen, with obstruction likely on the basis adhesions. 2. Small amount of associated ascites. 3. No other acute abnormality within the abdomen or pelvis. No evidence of bowel ischemia or free air. 4. Aortic atherosclerosis. Electronically Signed   By: Lajean Manes M.D.   On: 07/20/2018 09:30   Dg Chest 2 View  Result Date: 07/20/2018 CLINICAL DATA:  Pain EXAM:  CHEST - 2 VIEW COMPARISON:  August 23, 2017. FINDINGS: There is no appreciable edema or consolidation. There is mild left base atelectasis. Heart size and pulmonary vascularity are normal. No adenopathy. There are multiple air-fluid levels in the visualized bowel. IMPRESSION: Mild left base atelectasis. No edema or consolidation. Heart size normal. Air-fluid levels noted in multiple bowel loops. Question a degree of ileus or bowel obstruction. Electronically Signed   By: Lowella Grip III M.D.   On: 07/20/2018 09:23     Assessment & Plan:  TAISIA FANTINI is a 68 y.o. female with a small bowel obstruction. She has had multiple colectomies for stercoral ulcers causing bowel perforation in 2013 and 2017 and colostomies and subsequent takedown of 2 different colostomies.  She says she has had a bowel obstruction before too.  She does not quite understand nothing to eat or drink as she is drinking when I am talking to her and says she is hungry. She last vomited prior to coming to the ED.  -NPO, if vomits NG tube to be placed  -Suppository for stool in rectal vault  -Correct electrolytes as they are very deranged  -Will follow exam   All questions were answered to the satisfaction of the patient.   Virl Cagey 07/20/2018, 11:27 AM

## 2018-07-20 NOTE — ED Notes (Signed)
Call for report [facility] to give report

## 2018-07-20 NOTE — H&P (Signed)
History and Physical    AURELIA GRAS GGY:[id number] DOB: Jun 20, 1951 DOA: 07/20/2018  PCP: [name], FNP   Patient coming from: Home  Chief Complaint: Abdominal pain with nausea and vomiting  HPI: DOMINICA KENT is a 68 y.o. female with medical history significant for chronic pain with lumbago and bilateral knee osteoarthritis, hypothyroidism, anxiety, GERD, hypertension, dyslipidemia, COPD, and multiple prior abdominal surgeries with previous bowel obstructions who presented to the ED with gradual onset and worsening of generalized abdominal discomfort that began yesterday.  This was associated with several episodes of nausea and vomiting and felt similar to prior bowel obstruction that she had several years ago.  She does not recall exactly when her last bowel movement was but states that it was perhaps 2 to 3 days ago.  She denies any hematemesis, blood in her stool, or melena.  No fevers or chills otherwise noted and she denies chest pain or shortness of breath.   ED Course: Vital signs are noted to be stable.  Laboratory data with minimal leukocytosis of 11,700, but with potassium of 2.8 and magnesium of 0.9.  Her creatinine is 1.02 and not far from her usual baseline with indication of CKD stage III.  Two-view chest x-ray demonstrated some left-sided atelectasis at the lung base, but with air-fluid levels and multiple bowel loops that were thought to resemble partial small bowel obstruction or ileus.  This was confirmed on CT abdomen.  EDP had contacted general surgery who will see patient in consultation.  She has been given some IV fluid as well as potassium and magnesium repletion and morphine for abdominal pain.  She is also been given some Zofran for nausea and vomiting.  She states that she would like to have some liquids to try, but continues to complain of significant abdominal pain.  Review of Systems: All others reviewed and otherwise negative.  Past Medical History:    Diagnosis Date  . Chronic pain   . COPD (chronic obstructive pulmonary disease) ([place])    told has copd, no current inhaler use  . Cough   . Depression   . GERD (gastroesophageal reflux disease)   . Hypertension   . Hypothyroidism   . Insomnia   . Migraine   . Migraine   . Osteopenia   . Pain management   . Panic attacks   . Small bowel obstruction [facility])     Past Surgical History:  Procedure Laterality Date  . ABDOMINAL HYSTERECTOMY    . COLON SURGERY    . COLOSTOMY CLOSURE  04/19/2012   Procedure: COLOSTOMY CLOSURE;  Surgeon: Adin Hector, MD;  Location: [facility];  Service: General;  Laterality: N/A;  Laparotomy, Resection and Closure of Colostomy  . COLOSTOMY TAKEDOWN N/A 04/12/2016   Procedure: [place] TAKEDOWN;  Surgeon: Johnathan Hausen, MD;  Location: [facility];  Service: General;  Laterality: N/A;  . INCONTINENCE SURGERY    . LAPAROTOMY  10/04/2011, colostomy also   Procedure: EXPLORATORY LAPAROTOMY;  Surgeon: Adin Hector, MD;  Location: [facility];  Service: General;  Laterality: N/A;  left partial colectomy with colostomy  . LAPAROTOMY  04/19/2012   Procedure: EXPLORATORY LAPAROTOMY;  Surgeon: Adin Hector, MD;  Location: [facility];  Service: General;  Laterality: N/A;  . LAPAROTOMY N/A 11/29/2015   Procedure: EXPLORATORY LAPAROTOMY; SUBTOTAL COLECTOMY WITH HARTMAN PROCEDURE AND END COLOSTOMY;  Surgeon: Johnathan Hausen, MD;  Location: [facility];  Service: General;  Laterality: N/A;  . TUBAL LIGATION    .  VENTRAL HERNIA REPAIR  04/19/2012   Procedure: HERNIA REPAIR VENTRAL ADULT;  Surgeon: Adin Hector, MD;  Location: [facility];  Service: General;  Laterality: N/A;     reports that she quit smoking about 19 years ago. Her smoking use included cigarettes. She has a 30.00 pack-year smoking history. She has never used smokeless tobacco. She reports that she does not drink alcohol or use drugs.  Allergies  Allergen Reactions  . Toradol [Ketorolac Tromethamine] Shortness Of  Breath  . Butalbital-Apap-Caffeine Other (See Comments)    jittery  . Demerol Swelling  . Esgic [Butalbital-Apap-Caffeine] Other (See Comments)    jittery  . Iodine Other (See Comments)    bp bottomed out per pt several hours later; unsure if pre medicated in past with cm; done in [place]    Family History  Problem Relation Age of Onset  . Heart disease Mother   . Hypertension Mother   . Cancer Sister        sinus  . Diabetes Brother   . Heart disease Brother   . Thyroid disease Brother   . Cancer Sister        abdominal ?  . Thyroid disease Sister   . Cancer Other        GE junction adenocarcinoma  . Emphysema Father   . Stroke Father   . Hypertension Father   . Heart disease Father   . COPD Sister   . Thyroid disease Sister   . Thyroid disease Sister   . Diabetes Brother   . Thyroid disease Brother   . Thyroid disease Brother   . Hypertension Brother   . Post-traumatic stress disorder Brother   . COPD Brother   . Thyroid disease Brother   . Thyroid disease Brother   . Hypertension Brother   . Transient ischemic attack Brother     Prior to Admission medications   Medication Sig Start Date End Date Taking? Authorizing Provider  albuterol (VENTOLIN HFA) 108 (90 Base) MCG/ACT inhaler Inhale 2 puffs into the lungs every 6 (six) hours as needed for wheezing or shortness of breath.    Yes [provider]  amLODipine-olmesartan (AZOR) 10-40 MG tablet Take 1 tablet by mouth daily. 05/21/18  Yes Hawks, Christy A, FNP  Aspirin-Salicylamide-Caffeine (BC HEADACHE POWDER PO) Take 1 Dose by mouth daily as needed.   Yes [provider]  atorvastatin (LIPITOR) 20 MG tablet Take 1 tablet (20 mg total) by mouth daily. 05/22/18 05/22/19 Yes Hawks, Christy A, FNP  DULoxetine (CYMBALTA) 60 MG capsule Take 1 capsule (60 mg total) by mouth daily. 05/21/18  Yes Hawks, Christy A, FNP  Fluticasone Furoate-Vilanterol (BREO ELLIPTA IN) Inhale 1 puff into the lungs daily.   Yes  [provider]  HYDROcodone-homatropine (HYCODAN) 5-1.5 MG/5ML syrup Take 5 mLs by mouth every 8 (eight) hours as needed for cough. 07/18/18  Yes Hawks, Christy A, FNP  levothyroxine (SYNTHROID, LEVOTHROID) 25 MCG tablet Take 1 tablet (25 mcg total) by mouth daily before breakfast. 04/23/18  Yes Hawks, Christy A, FNP  ondansetron (ZOFRAN-ODT) 4 MG disintegrating tablet DISSOLVE 1 TABLET(4 MG) ON THE TONGUE EVERY 8 HOURS AS NEEDED FOR NAUSEA OR VOMITING Patient taking differently: Take 4 mg by mouth every 8 (eight) hours as needed for nausea. DISSOLVE 1 TABLET(4 MG) ON THE TONGUE EVERY 8 HOURS AS NEEDED FOR NAUSEA OR VOMITING 07/04/18  Yes Dettinger, Fransisca Kaufmann, MD  temazepam (RESTORIL) 30 MG capsule Take 1 capsule (30 mg total) by mouth  at bedtime as needed for sleep. 05/21/18  Yes Hawks, Christy A, FNP  traMADol (ULTRAM) 50 MG tablet Take 2 tablets (100 mg total) by mouth every 8 (eight) hours as needed (pain). Patient taking differently: Take 50 mg by mouth every 8 (eight) hours as needed (pain).  05/21/18  Yes Hawks, Christy A, FNP  Vitamin D, Ergocalciferol, (DRISDOL) 1.25 MG (50000 UT) CAPS capsule TAKE 1 CAPSULE (50000 UNITS) BY MOUTH EVERY 7 DAYS Patient taking differently: Take 50,000 Units by mouth. TAKE 1 CAPSULE (50000 UNITS) BY MOUTH EVERY 7 DAYS 05/21/18  Yes Hawks, Christy A, FNP  doxycycline (VIBRA-TABS) 100 MG tablet Take 1 tablet (100 mg total) by mouth 2 (two) times daily. Patient not taking: Reported on 07/20/2018 06/24/18   [name], FNP  famotidine (PEPCID) 20 MG tablet Take 1 tablet (20 mg total) by mouth daily. Patient not taking: Reported on 05/23/2018 03/14/18   Lauraine Rinne, NP  oseltamivir (TAMIFLU) 75 MG capsule Take 1 capsule (75 mg total) by mouth 2 (two) times daily. Patient not taking: Reported on 07/20/2018 06/24/18   [name], FNP    Physical Exam: Vitals:   07/20/18 0900 07/20/18 0915 07/20/18 0930 07/20/18 1000  BP: (!) 166/78  (!) 163/68  (!) 162/73  Pulse: 71 72 74 75  Resp: 15 10 14 10   Temp:      TempSrc:      SpO2: 96% 98% (!) 89% 96%  Weight:      Height:        Constitutional: NAD, calm, minimally uncomfortable. Vitals:   07/20/18 0900 07/20/18 0915 07/20/18 0930 07/20/18 1000  BP: (!) 166/78  (!) 163/68 (!) 162/73  Pulse: 71 72 74 75  Resp: 15 10 14 10   Temp:      TempSrc:      SpO2: 96% 98% (!) 89% 96%  Weight:      Height:       Eyes: lids and conjunctivae normal ENMT: Mucous membranes are moist.  Neck: normal, supple Respiratory: clear to auscultation bilaterally. Normal respiratory effort. No accessory muscle use.  Currently on room air. Cardiovascular: Regular rate and rhythm, no murmurs. No extremity edema. Abdomen: Tenderness to palpation in all 4 quadrants with no guarding, rigidity, or rebound, no distention. Bowel sounds appear to be very hypoactive. Musculoskeletal:  No joint deformity upper and lower extremities.   Skin: no rashes, lesions, ulcers.  Psychiatric: Cannot be assessed.  Labs on Admission: I have personally reviewed following labs and imaging studies  CBC: Recent Labs  Lab 07/20/18 0743  WBC 11.7*  NEUTROABS 10.4*  HGB 13.5  HCT 42.3  MCV 86.0  PLT 443*   Basic Metabolic Panel: Recent Labs  Lab 07/20/18 0743  NA 144  K 2.8*  CL 101  CO2 30  GLUCOSE 151*  BUN 17  CREATININE 1.02*  CALCIUM 9.4  MG 0.9*   GFR: Estimated Creatinine Clearance: 38.7 mL/min (A) (by C-G formula based on SCr of 1.02 mg/dL (H)). Liver Function Tests: Recent Labs  Lab 07/20/18 0743  AST 21  ALT 13  ALKPHOS 93  BILITOT 0.4  PROT 7.3  ALBUMIN 4.0   Recent Labs  Lab 07/20/18 0743  LIPASE 16   No results for input(s): AMMONIA in the last 168 hours. Coagulation Profile: No results for input(s): INR, PROTIME in the last 168 hours. Cardiac Enzymes: Recent Labs  Lab 07/20/18 0743  TROPONINI <0.03   BNP (last 3 results) No results for input(s):  PROBNP in the last 8760  hours. HbA1C: No results for input(s): HGBA1C in the last 72 hours. CBG: No results for input(s): GLUCAP in the last 168 hours. Lipid Profile: No results for input(s): CHOL, HDL, LDLCALC, TRIG, CHOLHDL, LDLDIRECT in the last 72 hours. Thyroid Function Tests: No results for input(s): TSH, T4TOTAL, FREET4, T3FREE, THYROIDAB in the last 72 hours. Anemia Panel: No results for input(s): VITAMINB12, FOLATE, FERRITIN, TIBC, IRON, RETICCTPCT in the last 72 hours. Urine analysis:    Component Value Date/Time   COLORURINE STRAW (A) 02/22/2017 1014   APPEARANCEUR Hazy (A) 05/23/2018 1543   LABSPEC 1.010 02/22/2017 1014   PHURINE 6.0 02/22/2017 1014   GLUCOSEU Negative 05/23/2018 1543   HGBUR NEGATIVE 02/22/2017 1014   BILIRUBINUR Negative 05/23/2018 1543   KETONESUR 5 (A) 02/22/2017 1014   PROTEINUR 1+ (A) 05/23/2018 1543   PROTEINUR NEGATIVE 02/22/2017 1014   UROBILINOGEN negative 07/01/2015 1541   UROBILINOGEN 0.2 10/07/2012 1928   NITRITE Positive (A) 05/23/2018 1543   NITRITE NEGATIVE 02/22/2017 1014   LEUKOCYTESUR 3+ (A) 05/23/2018 1543    Radiological Exams on Admission: Ct Abdomen Pelvis Wo Contrast  Result Date: 07/20/2018 CLINICAL DATA:  Per ED note. Pt states she has been having abdominal pain since yesterday with constant nausea and vomiting x 1 this morning. Is concerned it could be another bowel obstruction. Hx of COPD,GERD,HTN, SBO, Multiple bowel surgeries including hernia repair,colostomy and colostomy closure. EXAM: CT ABDOMEN AND PELVIS WITHOUT CONTRAST TECHNIQUE: Multidetector CT imaging of the abdomen and pelvis was performed following the standard protocol without IV contrast. COMPARISON:  11/29/2015 FINDINGS: Lower chest: No acute abnormality. Hepatobiliary: No focal liver abnormality is seen. No gallstones, gallbladder wall thickening, or biliary dilatation. Pancreas: Unremarkable. No pancreatic ductal dilatation or surrounding inflammatory changes. Spleen: Normal in  size without focal abnormality. Adrenals/Urinary Tract: Adrenal glands are unremarkable. Kidneys are normal, without renal calculi, focal lesion, or hydronephrosis. Bladder is unremarkable. Stomach/Bowel: The small bowel is dilated with air-fluid levels, maximum dilation of 3.8 cm. There is an anastomosis staple line along sigmoid colon, with this portion the colon mildly distended with stool. Colon extends superior to this along the mid abdomen towards the right upper quadrant. In the mid abdomen, just to the right of midline, there is an apparent transition site. There is no bowel thickening. There is a small amount ascites collecting adjacent to the liver and spleen and the small bowel mesentery and posterior pelvic recess. Vascular/Lymphatic: Aortic atherosclerosis. No aneurysm. No lymphadenopathy. Reproductive: Status post hysterectomy. No adnexal masses. Other: No abdominal wall hernia. Musculoskeletal: No fracture or acute finding. No osteoblastic or osteolytic lesions. IMPRESSION: 1. Partial bowel obstruction. The site of obstruction appears to be the residual colon in the right mid abdomen, with obstruction likely on the basis adhesions. 2. Small amount of associated ascites. 3. No other acute abnormality within the abdomen or pelvis. No evidence of bowel ischemia or free air. 4. Aortic atherosclerosis. Electronically Signed   By: Lajean Manes M.D.   On: 07/20/2018 09:30   Dg Chest 2 View  Result Date: 07/20/2018 CLINICAL DATA:  Pain EXAM: CHEST - 2 VIEW COMPARISON:  August 23, 2017. FINDINGS: There is no appreciable edema or consolidation. There is mild left base atelectasis. Heart size and pulmonary vascularity are normal. No adenopathy. There are multiple air-fluid levels in the visualized bowel. IMPRESSION: Mild left base atelectasis. No edema or consolidation. Heart size normal. Air-fluid levels noted in multiple bowel loops. Question a degree of ileus or  bowel obstruction. Electronically Signed    By: Lowella Grip III M.D.   On: 07/20/2018 09:23    EKG: Independently reviewed.  Sinus rhythm at 71 bpm.  Assessment/Plan Principal Problem:   Small bowel obstruction (HCC) Active Problems:   Hypertension   HLD (hyperlipidemia)   Hypothyroidism   Chronic pain   Anxiety   GERD (gastroesophageal reflux disease)   Hypokalemia   Hypomagnesemia    1. Partial small bowel obstruction with possible adhesions.  Patient has had multiple abdominal surgeries which makes adhesions very concerning.  Appreciate general surgery recommendations and will plan for conservative management at this time with bowel rest and IV fluid.  She appears to also have a component of ileus which is likely causing significant amounts of abdominal pain.  Will ensure that electrolytes are repleted.  Will require NG tube if nausea and vomiting becomes uncontrolled.  We will continue Zofran as well as medication with morphine for pain control since she cannot tolerate Toradol.  Will allow for small sips along with ice chips for now. 2. Hypokalemia/hypomagnesemia.  We will plan to replete aggressively with potassium and IV fluid as well as another dose of IV magnesium and recheck labs in a.m. 3. Hypertension.  IV hydralazine for now continue to monitor closely.  Will resume home medications once patient is able to tolerate diet. 4. Hypothyroidism.  IV Synthroid for now with conversion to oral once tolerating diet. 5. Chronic pain.  Will prescribe IV narcotics as needed for breakthrough pain and monitor carefully.  Want to try to avoid narcotics as much as possible given possibility of ileus. 6. GERD.  IV PPI daily. 7. Anxiety.  Will have Ativan ordered IV as needed. 8. Dyslipidemia.  Hold statin for now until diet tolerated.   DVT prophylaxis: Lovenox Code Status: Full Family Communication: None at bedside Disposition Plan: Conservatively manage bowel obstruction and replete magnesium and potassium  levels. Consults called: General surgery Admission status: Inpatient, telemetry   [facility] Pager [phone]  If 7PM-7AM, please contact night-coverage [email or web address] Password [email or web address]  07/20/2018, 11:30 AM

## 2018-07-20 NOTE — ED Notes (Signed)
Ice chips provided   Awaiting bed assignment

## 2018-07-20 NOTE — ED Notes (Signed)
Call for report   Quillian Quince, RN cannot take report on pt   Will call for report

## 2018-07-20 NOTE — ED Provider Notes (Signed)
[facility] EMERGENCY DEPARTMENT Provider Note   CSN: [id number] Arrival date & time: 07/20/18  3419     History   Chief Complaint Chief Complaint  Patient presents with  . Abdominal Pain    HPI Stacy Dalton is a 68 y.o. female.  HPI  Pt was seen at [place].  Per pt, c/o gradual onset and worsening of constant generalized abd "pain" since yesterday.  Has been associated with several intermittent episodes of N/V.  Describes the abd pain as "it might be another bowel obstruction."  Last BM was "a few days ago," and "was normal." Denies diarrhea, no fevers, no back pain, no rash, no CP/SOB, no black or blood in stools or emesis, no injury.      Past Medical History:  Diagnosis Date  . Chronic pain   . COPD (chronic obstructive pulmonary disease) ([place])    told has copd, no current inhaler use  . Cough   . Depression   . GERD (gastroesophageal reflux disease)   . Hypertension   . Hypothyroidism   . Insomnia   . Migraine   . Migraine   . Osteopenia   . Pain management   . Panic attacks   . Small bowel obstruction [facility])     Patient Active Problem List   Diagnosis Date Noted  . Cough 03/14/2018  . Fatigue 03/14/2018  . Polypharmacy 02/23/2017  . Hypokalemia 02/23/2017  . Toxic metabolic encephalopathy 37/90/2409  . Chronic pain 02/22/2017  . Anxiety 02/22/2017  . GERD (gastroesophageal reflux disease) 02/22/2017  . Seizure ([place])   . Pain medication agreement signed 10/10/2016  . Opioid dependence ([place]) 10/10/2016  . Status post colostomy takedown 04/12/2016  . Chronic constipation 12/03/2015  . Peritonitis with abscess of intestine ([place]) 11/29/2015  . COPD GOLD 0 with Asthmatic process  09/20/2015  . Osteopenia 06/10/2014  . Vitamin D deficiency disease 06/10/2014  . Hypothyroidism   . Depression 11/18/2012  . Insomnia 11/18/2012  . Chronic pain syndrome 11/18/2012  . HLD (hyperlipidemia) 09/01/2012  . S/P colostomy ([place]) 04/19/2012  . Normocytic anemia  10/15/2011  . Hypertension 10/13/2011  . Chronic back pain 10/13/2011    Past Surgical History:  Procedure Laterality Date  . ABDOMINAL HYSTERECTOMY    . COLON SURGERY    . COLOSTOMY CLOSURE  04/19/2012   Procedure: COLOSTOMY CLOSURE;  Surgeon: Adin Hector, MD;  Location: [facility];  Service: General;  Laterality: N/A;  Laparotomy, Resection and Closure of Colostomy  . COLOSTOMY TAKEDOWN N/A 04/12/2016   Procedure: [place] TAKEDOWN;  Surgeon: Johnathan Hausen, MD;  Location: [facility];  Service: General;  Laterality: N/A;  . INCONTINENCE SURGERY    . LAPAROTOMY  10/04/2011, colostomy also   Procedure: EXPLORATORY LAPAROTOMY;  Surgeon: Adin Hector, MD;  Location: [facility];  Service: General;  Laterality: N/A;  left partial colectomy with colostomy  . LAPAROTOMY  04/19/2012   Procedure: EXPLORATORY LAPAROTOMY;  Surgeon: Adin Hector, MD;  Location: [facility];  Service: General;  Laterality: N/A;  . LAPAROTOMY N/A 11/29/2015   Procedure: EXPLORATORY LAPAROTOMY; SUBTOTAL COLECTOMY WITH HARTMAN PROCEDURE AND END COLOSTOMY;  Surgeon: Johnathan Hausen, MD;  Location: [facility];  Service: General;  Laterality: N/A;  . TUBAL LIGATION    . VENTRAL HERNIA REPAIR  04/19/2012   Procedure: HERNIA REPAIR VENTRAL ADULT;  Surgeon: Adin Hector, MD;  Location: [facility];  Service: General;  Laterality: N/A;     OB History   No obstetric history on file.  Home Medications    Prior to Admission medications   Medication Sig Start Date End Date Taking? Authorizing Provider  albuterol (VENTOLIN HFA) 108 (90 Base) MCG/ACT inhaler Inhale into the lungs every 6 (six) hours as needed for wheezing or shortness of breath.    [provider]  amLODipine-olmesartan (AZOR) 10-40 MG tablet Take 1 tablet by mouth daily. 05/21/18   [name], FNP  atorvastatin (LIPITOR) 20 MG tablet Take 1 tablet (20 mg total) by mouth daily. 05/22/18 05/22/19  [name], FNP  doxycycline (VIBRA-TABS) 100 MG  tablet Take 1 tablet (100 mg total) by mouth 2 (two) times daily. 06/24/18   [name], FNP  DULoxetine (CYMBALTA) 60 MG capsule Take 1 capsule (60 mg total) by mouth daily. 05/21/18   [name], FNP  famotidine (PEPCID) 20 MG tablet Take 1 tablet (20 mg total) by mouth daily. Patient not taking: Reported on 05/23/2018 03/14/18   Lauraine Rinne, NP  Fluticasone Furoate-Vilanterol (BREO ELLIPTA IN) Inhale 1 puff into the lungs daily.    [provider]  Fluticasone-Umeclidin-Vilant (TRELEGY ELLIPTA) 100-62.5-25 MCG/INH AEPB Inhale 1 puff into the lungs daily. Patient not taking: Reported on 05/23/2018 05/21/18   Evelina Dun A, FNP  HYDROcodone-homatropine [facility]) 5-1.5 MG/5ML syrup Take 5 mLs by mouth every 8 (eight) hours as needed for cough. 07/18/18   [name], FNP  levothyroxine (SYNTHROID, LEVOTHROID) 25 MCG tablet Take 1 tablet (25 mcg total) by mouth daily before breakfast. 04/23/18   Evelina Dun A, FNP  ondansetron (ZOFRAN-ODT) 4 MG disintegrating tablet DISSOLVE 1 TABLET(4 MG) ON THE TONGUE EVERY 8 HOURS AS NEEDED FOR NAUSEA OR VOMITING 07/04/18   Dettinger, Fransisca Kaufmann, MD  oseltamivir (TAMIFLU) 75 MG capsule Take 1 capsule (75 mg total) by mouth 2 (two) times daily. 06/24/18   [name], FNP  predniSONE (STERAPRED UNI-PAK 21 TAB) 10 MG (21) TBPK tablet Use as directed 06/24/18   Evelina Dun A, FNP  temazepam (RESTORIL) 30 MG capsule Take 1 capsule (30 mg total) by mouth at bedtime as needed for sleep. 05/21/18   [name], FNP  traMADol (ULTRAM) 50 MG tablet Take 2 tablets (100 mg total) by mouth every 8 (eight) hours as needed (pain). 05/21/18   [name], FNP  Vitamin D, Ergocalciferol, (DRISDOL) 1.25 MG (50000 UT) CAPS capsule TAKE 1 CAPSULE (50000 UNITS) BY MOUTH EVERY 7 DAYS 05/21/18   [name], FNP    Family History Family History  Problem Relation Age of Onset  . Heart disease Mother   . Hypertension Mother   .  Cancer Sister        sinus  . Diabetes Brother   . Heart disease Brother   . Thyroid disease Brother   . Cancer Sister        abdominal ?  . Thyroid disease Sister   . Cancer Other        GE junction adenocarcinoma  . Emphysema Father   . Stroke Father   . Hypertension Father   . Heart disease Father   . COPD Sister   . Thyroid disease Sister   . Thyroid disease Sister   . Diabetes Brother   . Thyroid disease Brother   . Thyroid disease Brother   . Hypertension Brother   . Post-traumatic stress disorder Brother   . COPD Brother   . Thyroid disease Brother   . Thyroid disease Brother   . Hypertension Brother   .  Transient ischemic attack Brother     Social History Social History   Tobacco Use  . Smoking status: Former Smoker    Packs/day: 1.50    Years: 20.00    Pack years: 30.00    Types: Cigarettes    Last attempt to quit: 10/04/1998    Years since quitting: 19.8  . Smokeless tobacco: Never Used  Substance Use Topics  . Alcohol use: No  . Drug use: No     Allergies   Toradol [ketorolac tromethamine]; Butalbital-apap-caffeine; Demerol; Esgic [butalbital-apap-caffeine]; and Iodine   Review of Systems Review of Systems ROS: Statement: All systems negative except as marked or noted in the HPI; Constitutional: Negative for fever and chills. ; ; Eyes: Negative for eye pain, redness and discharge. ; ; ENMT: Negative for ear pain, hoarseness, nasal congestion, sinus pressure and sore throat. ; ; Cardiovascular: Negative for chest pain, palpitations, diaphoresis, dyspnea and peripheral edema. ; ; Respiratory: Negative for cough, wheezing and stridor. ; ; Gastrointestinal: +N/V, abd pain. Negative for diarrhea, blood in stool, hematemesis, jaundice and rectal bleeding. . ; ; Genitourinary: Negative for dysuria, flank pain and hematuria. ; ; Musculoskeletal: Negative for back pain and neck pain. Negative for swelling and trauma.; ; Skin: Negative for pruritus, rash,  abrasions, blisters, bruising and skin lesion.; ; Neuro: Negative for headache, lightheadedness and neck stiffness. Negative for weakness, altered level of consciousness, altered mental status, extremity weakness, paresthesias, involuntary movement, seizure and syncope.       Physical Exam Updated Vital Signs BP (!) 164/77 (BP Location: Right Arm)   Pulse 72   Temp 98.5 F (36.9 C) (Oral)   Resp 18   Ht 5\' 1"  (1.549 m)   Wt 49.4 kg   SpO2 98%   BMI 20.60 kg/m   BP (!) 166/78   Pulse 72   Temp 98.5 F (36.9 C) (Oral)   Resp 10   Ht 5\' 1"  (1.549 m)   Wt 49.4 kg   SpO2 98%   BMI 20.60 kg/m    Physical Exam 0730: Physical examination:  Nursing notes reviewed; Vital signs and O2 SAT reviewed;  Constitutional: Well developed, Well nourished, Uncomfortable appearing; Head:  Normocephalic, atraumatic; Eyes: EOMI, PERRL, No scleral icterus; ENMT: Mouth and pharynx normal, Mucous membranes dry; Neck: Supple, Full range of motion, No lymphadenopathy; Cardiovascular: Regular rate and rhythm, No gallop; Respiratory: Breath sounds clear & equal bilaterally, No wheezes.  Speaking full sentences with ease, Normal respiratory effort/excursion; Chest: Nontender, Movement normal; Abdomen: Soft, +diffuse tenderness to palp. Nondistended, Decreased bowel sounds; Genitourinary: No CVA tenderness; Extremities: Peripheral pulses normal, No tenderness, No edema, No calf edema or asymmetry.; Neuro: AA&Ox3, Major CN grossly intact.  Speech clear. No gross focal motor or sensory deficits in extremities.; Skin: Color normal, Warm, Dry.   ED Treatments / Results  Labs (all labs ordered are listed, but only abnormal results are displayed)   EKG EKG Interpretation  Date/Time:  Saturday July 20 2018 07:22:34 EST Ventricular Rate:  71 PR Interval:    QRS Duration: 81 QT Interval:  390 QTC Calculation: 424 R Axis:   71 Text Interpretation:  Sinus rhythm When compared with ECG of 02/22/2017 No  significant change was found Confirmed by Francine Graven [phone]) on 07/20/2018 7:35:17 AM   Radiology   Procedures Procedures (including critical care time)  Medications Ordered in ED Medications  0.9 %  sodium chloride infusion (has no administration in time range)  ondansetron (ZOFRAN) injection 4 mg (has no  administration in time range)  morphine 4 MG/ML injection 4 mg (has no administration in time range)     Initial Impression / Assessment and Plan / ED Course  I have reviewed the triage vital signs and the nursing notes.  Pertinent labs & imaging results that were available during my care of the patient were reviewed by me and considered in my medical decision making (see chart for details).  MDM Reviewed: previous chart, nursing note and vitals Reviewed previous: labs and ECG Interpretation: labs, ECG, x-ray and CT scan Total time providing critical care: 30-74 minutes. This excludes time spent performing separately reportable procedures and services. Consults: general surgery and admitting MD   CRITICAL CARE Performed by: Francine Graven Total critical care time: 35 minutes Critical care time was exclusive of separately billable procedures and treating other patients. Critical care was necessary to treat or prevent imminent or life-threatening deterioration. Critical care was time spent personally by me on the following activities: development of treatment plan with patient and/or surrogate as well as nursing, discussions with consultants, evaluation of patient's response to treatment, examination of patient, obtaining history from patient or surrogate, ordering and performing treatments and interventions, ordering and review of laboratory studies, ordering and review of radiographic studies, pulse oximetry and re-evaluation of patient's condition.  Results for orders placed or performed during the hospital encounter of 07/20/18  Comprehensive metabolic panel  Result  Value Ref Range   Sodium 144 135 - 145 mmol/L   Potassium 2.8 (L) 3.5 - 5.1 mmol/L   Chloride 101 98 - 111 mmol/L   CO2 30 22 - 32 mmol/L   Glucose, Bld 151 (H) 70 - 99 mg/dL   BUN 17 8 - 23 mg/dL   Creatinine, Ser 1.02 (H) 0.44 - 1.00 mg/dL   Calcium 9.4 8.9 - 10.3 mg/dL   Total Protein 7.3 6.5 - 8.1 g/dL   Albumin 4.0 3.5 - 5.0 g/dL   AST 21 15 - 41 U/L   ALT 13 0 - 44 U/L   Alkaline Phosphatase 93 38 - 126 U/L   Total Bilirubin 0.4 0.3 - 1.2 mg/dL   GFR calc non Af Amer 56 (L) >60 mL/min   GFR calc Af Amer >60 >60 mL/min   Anion gap 13 5 - 15  Lipase, blood  Result Value Ref Range   Lipase 16 11 - 51 U/L  Troponin I - Once  Result Value Ref Range   Troponin I <0.03 <0.03 ng/mL  Lactic acid, plasma  Result Value Ref Range   Lactic Acid, Venous 0.9 0.5 - 1.9 mmol/L  CBC with Differential  Result Value Ref Range   WBC 11.7 (H) 4.0 - 10.5 K/uL   RBC 4.92 3.87 - 5.11 MIL/uL   Hemoglobin 13.5 12.0 - 15.0 g/dL   HCT 42.3 36.0 - 46.0 %   MCV 86.0 80.0 - 100.0 fL   MCH 27.4 26.0 - 34.0 pg   MCHC 31.9 30.0 - 36.0 g/dL   RDW 13.6 11.5 - 15.5 %   Platelets 407 (H) 150 - 400 K/uL   nRBC 0.0 0.0 - 0.2 %   Neutrophils Relative % 89 %   Neutro Abs 10.4 (H) 1.7 - 7.7 K/uL   Lymphocytes Relative 5 %   Lymphs Abs 0.6 (L) 0.7 - 4.0 K/uL   Monocytes Relative 5 %   Monocytes Absolute 0.6 0.1 - 1.0 K/uL   Eosinophils Relative 1 %   Eosinophils Absolute 0.1 0.0 - 0.5 K/uL  Basophils Relative 0 %   Basophils Absolute 0.0 0.0 - 0.1 K/uL   Immature Granulocytes 0 %   Abs Immature Granulocytes 0.04 0.00 - 0.07 K/uL  Magnesium  Result Value Ref Range   Magnesium 0.9 (LL) 1.7 - 2.4 mg/dL   Ct Abdomen Pelvis Wo Contrast Result Date: 07/20/2018 CLINICAL DATA:  Per ED note. Pt states she has been having abdominal pain since yesterday with constant nausea and vomiting x 1 this morning. Is concerned it could be another bowel obstruction. Hx of COPD,GERD,HTN, SBO, Multiple bowel surgeries  including hernia repair,colostomy and colostomy closure. EXAM: CT ABDOMEN AND PELVIS WITHOUT CONTRAST TECHNIQUE: Multidetector CT imaging of the abdomen and pelvis was performed following the standard protocol without IV contrast. COMPARISON:  11/29/2015 FINDINGS: Lower chest: No acute abnormality. Hepatobiliary: No focal liver abnormality is seen. No gallstones, gallbladder wall thickening, or biliary dilatation. Pancreas: Unremarkable. No pancreatic ductal dilatation or surrounding inflammatory changes. Spleen: Normal in size without focal abnormality. Adrenals/Urinary Tract: Adrenal glands are unremarkable. Kidneys are normal, without renal calculi, focal lesion, or hydronephrosis. Bladder is unremarkable. Stomach/Bowel: The small bowel is dilated with air-fluid levels, maximum dilation of 3.8 cm. There is an anastomosis staple line along sigmoid colon, with this portion the colon mildly distended with stool. Colon extends superior to this along the mid abdomen towards the right upper quadrant. In the mid abdomen, just to the right of midline, there is an apparent transition site. There is no bowel thickening. There is a small amount ascites collecting adjacent to the liver and spleen and the small bowel mesentery and posterior pelvic recess. Vascular/Lymphatic: Aortic atherosclerosis. No aneurysm. No lymphadenopathy. Reproductive: Status post hysterectomy. No adnexal masses. Other: No abdominal wall hernia. Musculoskeletal: No fracture or acute finding. No osteoblastic or osteolytic lesions. IMPRESSION: 1. Partial bowel obstruction. The site of obstruction appears to be the residual colon in the right mid abdomen, with obstruction likely on the basis adhesions. 2. Small amount of associated ascites. 3. No other acute abnormality within the abdomen or pelvis. No evidence of bowel ischemia or free air. 4. Aortic atherosclerosis. Electronically Signed   By: Lajean Manes M.D.   On: 07/20/2018 09:30   Dg Chest 2  View Result Date: 07/20/2018 CLINICAL DATA:  Pain EXAM: CHEST - 2 VIEW COMPARISON:  August 23, 2017. FINDINGS: There is no appreciable edema or consolidation. There is mild left base atelectasis. Heart size and pulmonary vascularity are normal. No adenopathy. There are multiple air-fluid levels in the visualized bowel. IMPRESSION: Mild left base atelectasis. No edema or consolidation. Heart size normal. Air-fluid levels noted in multiple bowel loops. Question a degree of ileus or bowel obstruction. Electronically Signed   By: Lowella Grip III M.D.   On: 07/20/2018 09:23    0945:  Potassium and magnesium repleted IV. IVF continues. CT as above. Dx and testing d/w pt.  Questions answered.  Verb understanding, agreeable to admit. T/C returned from General Surgery Dr. Constance Haw, case discussed, including:  HPI, pertinent PM/SHx, VS/PE, dx testing, ED course and treatment:  Agreeable to consult.   9242:  T/C returned from [facility] Dr. Manuella Ghazi, case discussed, including:  HPI, pertinent PM/SHx, VS/PE, dx testing, ED course and treatment:  Agreeable to admit.     Final Clinical Impressions(s) / ED Diagnoses   Final diagnoses:  None    ED Discharge Orders    None       Francine Graven, DO 07/24/18 1220

## 2018-07-21 ENCOUNTER — Encounter (HOSPITAL_COMMUNITY): Payer: Self-pay | Admitting: Radiology

## 2018-07-21 ENCOUNTER — Inpatient Hospital Stay (HOSPITAL_COMMUNITY): Payer: Medicare Other

## 2018-07-21 DIAGNOSIS — K56609 Unspecified intestinal obstruction, unspecified as to partial versus complete obstruction: Secondary | ICD-10-CM

## 2018-07-21 LAB — COMPREHENSIVE METABOLIC PANEL
ALT: 11 U/L (ref 0–44)
AST: 18 U/L (ref 15–41)
Albumin: 3.3 g/dL — ABNORMAL LOW (ref 3.5–5.0)
Alkaline Phosphatase: 78 U/L (ref 38–126)
Anion gap: 8 (ref 5–15)
BUN: 16 mg/dL (ref 8–23)
CO2: 26 mmol/L (ref 22–32)
Calcium: 8.6 mg/dL — ABNORMAL LOW (ref 8.9–10.3)
Chloride: 106 mmol/L (ref 98–111)
Creatinine, Ser: 0.85 mg/dL (ref 0.44–1.00)
GFR calc Af Amer: >60 mL/min (ref >60)
GFR calc non Af Amer: >60 mL/min (ref >60)
Glucose, Bld: 142 mg/dL — ABNORMAL HIGH (ref 70–99)
Potassium: 3.8 mmol/L (ref 3.5–5.1)
Sodium: 140 mmol/L (ref 135–145)
Total Bilirubin: 0.4 mg/dL (ref 0.3–1.2)
Total Protein: 6 g/dL — ABNORMAL LOW (ref 6.5–8.1)

## 2018-07-21 LAB — CBC
HCT: 41.7 % (ref 36.0–46.0)
Hemoglobin: 13.2 g/dL (ref 12.0–15.0)
MCH: 27.5 pg (ref 26.0–34.0)
MCHC: 31.7 g/dL (ref 30.0–36.0)
MCV: 86.9 fL (ref 80.0–100.0)
Platelets: 432 10*3/uL — ABNORMAL HIGH (ref 150–400)
RBC: 4.8 MIL/uL (ref 3.87–5.11)
RDW: 13.8 % (ref 11.5–15.5)
WBC: 5.3 10*3/uL (ref 4.0–10.5)
nRBC: 0 % (ref 0.0–0.2)

## 2018-07-21 LAB — MAGNESIUM: Magnesium: 2.3 mg/dL (ref 1.7–2.4)

## 2018-07-21 LAB — HIV ANTIBODY (ROUTINE TESTING W REFLEX): HIV Screen 4th Generation wRfx: NONREACTIVE

## 2018-07-21 MED ORDER — AMLODIPINE BESYLATE 5 MG PO TABS: 10.0000 mg | ORAL_TABLET | Freq: Every day | ORAL | Status: DC

## 2018-07-21 MED ORDER — LEVOTHYROXINE SODIUM 25 MCG PO TABS: 25.0000 ug | ORAL_TABLET | Freq: Every day | ORAL | Status: DC

## 2018-07-21 MED ORDER — HYDROMORPHONE HCL 1 MG/ML IJ SOLN
0.5000 mg | INTRAMUSCULAR | Status: DC | PRN
  Administered 2018-07-21 – 2018-07-23 (×17): 0.5 mg via INTRAVENOUS
  Filled 2018-07-21 (×19): qty 0.5

## 2018-07-21 MED ORDER — AMLODIPINE-OLMESARTAN 10-40 MG PO TABS: 1.0000 | ORAL_TABLET | Freq: Every day | ORAL | Status: DC

## 2018-07-21 MED ORDER — BISACODYL 10 MG RE SUPP
10.0000 mg | Freq: Once | RECTAL | Status: AC
  Administered 2018-07-21: 10 mg via RECTAL
  Filled 2018-07-21: qty 1

## 2018-07-21 MED ORDER — TRAMADOL HCL 50 MG PO TABS: 50.0000 mg | ORAL_TABLET | Freq: Three times a day (TID) | ORAL | Status: DC | PRN

## 2018-07-21 MED ORDER — ATORVASTATIN CALCIUM 20 MG PO TABS: 20.0000 mg | ORAL_TABLET | Freq: Every day | ORAL | Status: DC

## 2018-07-21 MED ORDER — IRBESARTAN 300 MG PO TABS: 300.0000 mg | ORAL_TABLET | Freq: Every day | ORAL | Status: DC

## 2018-07-21 MED ORDER — LEVOTHYROXINE SODIUM 100 MCG/5ML IV SOLN
12.5000 ug | Freq: Every day | INTRAVENOUS | Status: DC
  Administered 2018-07-22: 12.5 ug via INTRAVENOUS
  Filled 2018-07-21 (×2): qty 5

## 2018-07-21 MED ORDER — TEMAZEPAM 15 MG PO CAPS: 30.0000 mg | ORAL_CAPSULE | Freq: Every evening | ORAL | Status: DC | PRN

## 2018-07-21 MED ORDER — DULOXETINE HCL 60 MG PO CPEP
60.0000 mg | ORAL_CAPSULE | Freq: Every day | ORAL | Status: DC
  Filled 2018-07-21: qty 1

## 2018-07-21 MED ORDER — LACTATED RINGERS IV SOLN
INTRAVENOUS | Status: DC
  Administered 2018-07-21 – 2018-07-23 (×4): via INTRAVENOUS

## 2018-07-21 NOTE — Progress Notes (Signed)
PROGRESS NOTE    Stacy Dalton  GUY:[id number] DOB: [birth year] DOA: 07/20/2018 PCP: [name], FNP   Brief Narrative:  Per HPI: Stacy Dalton is a 68 y.o. female with medical history significant for chronic pain with lumbago and bilateral knee osteoarthritis, hypothyroidism, anxiety, GERD, hypertension, dyslipidemia, COPD, and multiple prior abdominal surgeries with previous bowel obstructions who presented to the ED with gradual onset and worsening of generalized abdominal discomfort that began yesterday.  This was associated with several episodes of nausea and vomiting and felt similar to prior bowel obstruction that she had several years ago.  She does not recall exactly when her last bowel movement was but states that it was perhaps 2 to 3 days ago.  She denies any hematemesis, blood in her stool, or melena.  No fevers or chills otherwise noted and she denies chest pain or shortness of breath.  Patient has been admitted for partial small bowel obstruction and has been passing flatus, but no bowel movement.  She continues to have significant abdominal pain but denies any further nausea or vomiting.  Assessment & Plan:   Principal Problem:   Small bowel obstruction (HCC) Active Problems:   Hypertension   HLD (hyperlipidemia)   Hypothyroidism   Chronic pain   Anxiety   GERD (gastroesophageal reflux disease)   Hypokalemia   Hypomagnesemia  1. Partial small bowel obstruction with possible adhesions.  Patient has had multiple abdominal surgeries which makes adhesions very concerning.  Appreciate general surgery recommendations and will plan for conservative management at this time with bowel rest and IV fluid.  She appears to also have a component of ileus which is likely causing significant amounts of abdominal pain.    Will continue to recheck electrolytes to ensure that these are stable. Will require NG tube if nausea and vomiting becomes uncontrolled.  We will continue  Zofran as well as medication with Dilaudid now for pain control since she cannot tolerate Toradol.  Will allow for small sips along with ice chips for now and restart some home medications to see how this is tolerated. 2. Hypokalemia/hypomagnesemia-resolved.    Change IV fluid to LR at this point and recheck labs in a.m. and monitor closely. 3. Hypertension.  IV hydralazine for now continue to monitor closely.    Blood pressure restarting increase in home medication has been restarted. 4. Hypothyroidism.  IV Synthroid discontinued and resume home oral Synthroid, hopefully this will be tolerated. 5. Acute on chronic pain.    Will increase IV pain medication to Dilaudid 0.5 mg every 2 hours for breakthrough pain for better pain control.  Will resume home tramadol as well as Cymbalta which should also help. 6. GERD.  IV PPI daily. 7. Anxiety.  Will have Ativan ordered IV as needed. 8. Dyslipidemia.    Continue to hold statin until diet is resumed.  DVT prophylaxis: Lovenox Code Status: Full Family Communication: None at bedside Disposition Plan: Continue to conservatively manage bowel obstruction-appreciate general surgery recommendations.  Will increase pain management with IV medications and start home oral medications to see how this is tolerated.  Continue to monitor electrolyte levels.  Likely plan to advance diet in the next 1 to 2 days once pain is better controlled and bowel function has returned   Consultants:   General surgery  Procedures:   None  Antimicrobials:   None   Subjective: Patient seen and evaluated today with ongoing, severe abdominal pain.  She states that doses of morphine did not  help her at all.  She appears quite uncomfortable and states that she has been passing flatus but no bowel movement is otherwise noted.  She denies any further nausea or vomiting overnight.  Objective: Vitals:   07/20/18 1444 07/20/18 2100 07/21/18 0500 07/21/18 0757  BP: (!) 158/68  (!) 168/81 (!) 177/77   Pulse: 77 86 90   Resp: 18 17 18    Temp: 98.6 F (37 C) 98.2 F (36.8 C) 98 F (36.7 C)   TempSrc: Oral Oral Oral   SpO2: 100% 94% 95% 93%  Weight: 50 kg     Height: 5\' 1"  (1.549 m)       Intake/Output Summary (Last 24 hours) at 07/21/2018 1049 Last data filed at 07/20/2018 1552 Gross per 24 hour  Intake 746.83 ml  Output -  Net 746.83 ml   Filed Weights   07/20/18 0716 07/20/18 1444  Weight: 49.4 kg 50 kg    Examination:  General exam: Appears mildly distressed and uncomfortable. Respiratory system: Clear to auscultation. Respiratory effort normal. Cardiovascular system: S1 & S2 heard, RRR. No JVD, murmurs, rubs, gallops or clicks. No pedal edema. Gastrointestinal system: Abdomen is nondistended, soft and nontender. No organomegaly or masses felt. Normal bowel sounds heard. Central nervous system: Alert and oriented. No focal neurological deficits. Extremities: Symmetric 5 x 5 power. Skin: No rashes, lesions or ulcers Psychiatry: Cannot be fully evaluated.    Data Reviewed: I have personally reviewed following labs and imaging studies  CBC: Recent Labs  Lab 07/20/18 0743 07/21/18 0542  WBC 11.7* 5.3  NEUTROABS 10.4*  --   HGB 13.5 13.2  HCT 42.3 41.7  MCV 86.0 86.9  PLT 407* 893*   Basic Metabolic Panel: Recent Labs  Lab 07/20/18 0743 07/21/18 0542  NA 144 140  K 2.8* 3.8  CL 101 106  CO2 30 26  GLUCOSE 151* 142*  BUN 17 16  CREATININE 1.02* 0.85  CALCIUM 9.4 8.6*  MG 0.9* 2.3   GFR: Estimated Creatinine Clearance: 46.5 mL/min (by C-G formula based on SCr of 0.85 mg/dL). Liver Function Tests: Recent Labs  Lab 07/20/18 0743 07/21/18 0542  AST 21 18  ALT 13 11  ALKPHOS 93 78  BILITOT 0.4 0.4  PROT 7.3 6.0*  ALBUMIN 4.0 3.3*   Recent Labs  Lab 07/20/18 0743  LIPASE 16   No results for input(s): AMMONIA in the last 168 hours. Coagulation Profile: No results for input(s): INR, PROTIME in the last 168  hours. Cardiac Enzymes: Recent Labs  Lab 07/20/18 0743  TROPONINI <0.03   BNP (last 3 results) No results for input(s): PROBNP in the last 8760 hours. HbA1C: No results for input(s): HGBA1C in the last 72 hours. CBG: No results for input(s): GLUCAP in the last 168 hours. Lipid Profile: No results for input(s): CHOL, HDL, LDLCALC, TRIG, CHOLHDL, LDLDIRECT in the last 72 hours. Thyroid Function Tests: No results for input(s): TSH, T4TOTAL, FREET4, T3FREE, THYROIDAB in the last 72 hours. Anemia Panel: No results for input(s): VITAMINB12, FOLATE, FERRITIN, TIBC, IRON, RETICCTPCT in the last 72 hours. Sepsis Labs: Recent Labs  Lab 07/20/18 0743 07/20/18 1006  LATICACIDVEN 0.9 0.8    No results found for this or any previous visit (from the past 240 hour(s)).       Radiology Studies: Ct Abdomen Pelvis Wo Contrast  Result Date: 07/20/2018 CLINICAL DATA:  Per ED note. Pt states she has been having abdominal pain since yesterday with constant nausea and vomiting x 1 this  morning. Is concerned it could be another bowel obstruction. Hx of COPD,GERD,HTN, SBO, Multiple bowel surgeries including hernia repair,colostomy and colostomy closure. EXAM: CT ABDOMEN AND PELVIS WITHOUT CONTRAST TECHNIQUE: Multidetector CT imaging of the abdomen and pelvis was performed following the standard protocol without IV contrast. COMPARISON:  11/29/2015 FINDINGS: Lower chest: No acute abnormality. Hepatobiliary: No focal liver abnormality is seen. No gallstones, gallbladder wall thickening, or biliary dilatation. Pancreas: Unremarkable. No pancreatic ductal dilatation or surrounding inflammatory changes. Spleen: Normal in size without focal abnormality. Adrenals/Urinary Tract: Adrenal glands are unremarkable. Kidneys are normal, without renal calculi, focal lesion, or hydronephrosis. Bladder is unremarkable. Stomach/Bowel: The small bowel is dilated with air-fluid levels, maximum dilation of 3.8 cm. There is an  anastomosis staple line along sigmoid colon, with this portion the colon mildly distended with stool. Colon extends superior to this along the mid abdomen towards the right upper quadrant. In the mid abdomen, just to the right of midline, there is an apparent transition site. There is no bowel thickening. There is a small amount ascites collecting adjacent to the liver and spleen and the small bowel mesentery and posterior pelvic recess. Vascular/Lymphatic: Aortic atherosclerosis. No aneurysm. No lymphadenopathy. Reproductive: Status post hysterectomy. No adnexal masses. Other: No abdominal wall hernia. Musculoskeletal: No fracture or acute finding. No osteoblastic or osteolytic lesions. IMPRESSION: 1. Partial bowel obstruction. The site of obstruction appears to be the residual colon in the right mid abdomen, with obstruction likely on the basis adhesions. 2. Small amount of associated ascites. 3. No other acute abnormality within the abdomen or pelvis. No evidence of bowel ischemia or free air. 4. Aortic atherosclerosis. Electronically Signed   By: Lajean Manes M.D.   On: 07/20/2018 09:30   Dg Chest 2 View  Result Date: 07/20/2018 CLINICAL DATA:  Pain EXAM: CHEST - 2 VIEW COMPARISON:  August 23, 2017. FINDINGS: There is no appreciable edema or consolidation. There is mild left base atelectasis. Heart size and pulmonary vascularity are normal. No adenopathy. There are multiple air-fluid levels in the visualized bowel. IMPRESSION: Mild left base atelectasis. No edema or consolidation. Heart size normal. Air-fluid levels noted in multiple bowel loops. Question a degree of ileus or bowel obstruction. Electronically Signed   By: Lowella Grip III M.D.   On: 07/20/2018 09:23        Scheduled Meds: . amLODipine-olmesartan  1 tablet Oral Daily  . DULoxetine  60 mg Oral Daily  . enoxaparin (LOVENOX) injection  40 mg Subcutaneous Q24H  . fluticasone furoate-vilanterol  1 puff Inhalation Daily  .  levothyroxine  25 mcg Oral QAC breakfast   Continuous Infusions: . lactated ringers 75 mL/hr at 07/21/18 1013     LOS: 1 day    Time spent: 30 minutes    Pratik Darleen Crocker, DO [facility] Pager [phone]  If 7PM-7AM, please contact night-coverage [email or web address] Password [facility] 07/21/2018, 10:49 AM

## 2018-07-21 NOTE — Progress Notes (Signed)
[facility] Progress Note     Subjective: Said she vomited last night. Pain continues. Refused NG yesterday. No BM or flatus.  Objective: Vital signs in last 24 hours: Temp:  [98 F (36.7 C)-98.6 F (37 C)] 98 F (36.7 C) (01/19 0500) Pulse Rate:  [77-90] 90 (01/19 0500) Resp:  [14-18] 18 (01/19 0500) BP: (158-177)/(62-81) 177/77 (01/19 0500) SpO2:  [90 %-100 %] 93 % (01/19 0757) Weight:  [50 kg] 50 kg (01/18 1444) Last BM Date: (unknown, patient states maybe 4 days ago)  Intake/Output from previous day: 01/18 0701 - 01/19 0700 In: 846.8 [I.V.:521.3; IV Piggyback:325.6] Out: -  Intake/Output this shift: No intake/output data recorded.  General appearance: alert, cooperative and no distress Resp: normal work breathing GI: soft, generalized pain, tenderness less with distraction  Lab Results:  Recent Labs    07/20/18 0743 07/21/18 0542  WBC 11.7* 5.3  HGB 13.5 13.2  HCT 42.3 41.7  PLT 407* 432*   BMET Recent Labs    07/20/18 0743 07/21/18 0542  NA 144 140  K 2.8* 3.8  CL 101 106  CO2 30 26  GLUCOSE 151* 142*  BUN 17 16  CREATININE 1.02* 0.85  CALCIUM 9.4 8.6*   PT/INR No results for input(s): LABPROT, INR in the last 72 hours.  Studies/Results: Ct Abdomen Pelvis Wo Contrast  Result Date: 07/20/2018 CLINICAL DATA:  Per ED note. Pt states she has been having abdominal pain since yesterday with constant nausea and vomiting x 1 this morning. Is concerned it could be another bowel obstruction. Hx of COPD,GERD,HTN, SBO, Multiple bowel surgeries including hernia repair,colostomy and colostomy closure. EXAM: CT ABDOMEN AND PELVIS WITHOUT CONTRAST TECHNIQUE: Multidetector CT imaging of the abdomen and pelvis was performed following the standard protocol without IV contrast. COMPARISON:  11/29/2015 FINDINGS: Lower chest: No acute abnormality. Hepatobiliary: No focal liver abnormality is seen. No gallstones, gallbladder wall thickening, or biliary  dilatation. Pancreas: Unremarkable. No pancreatic ductal dilatation or surrounding inflammatory changes. Spleen: Normal in size without focal abnormality. Adrenals/Urinary Tract: Adrenal glands are unremarkable. Kidneys are normal, without renal calculi, focal lesion, or hydronephrosis. Bladder is unremarkable. Stomach/Bowel: The small bowel is dilated with air-fluid levels, maximum dilation of 3.8 cm. There is an anastomosis staple line along sigmoid colon, with this portion the colon mildly distended with stool. Colon extends superior to this along the mid abdomen towards the right upper quadrant. In the mid abdomen, just to the right of midline, there is an apparent transition site. There is no bowel thickening. There is a small amount ascites collecting adjacent to the liver and spleen and the small bowel mesentery and posterior pelvic recess. Vascular/Lymphatic: Aortic atherosclerosis. No aneurysm. No lymphadenopathy. Reproductive: Status post hysterectomy. No adnexal masses. Other: No abdominal wall hernia. Musculoskeletal: No fracture or acute finding. No osteoblastic or osteolytic lesions. IMPRESSION: 1. Partial bowel obstruction. The site of obstruction appears to be the residual colon in the right mid abdomen, with obstruction likely on the basis adhesions. 2. Small amount of associated ascites. 3. No other acute abnormality within the abdomen or pelvis. No evidence of bowel ischemia or free air. 4. Aortic atherosclerosis. Electronically Signed   By: Lajean Manes M.D.   On: 07/20/2018 09:30   Dg Chest 2 View  Result Date: 07/20/2018 CLINICAL DATA:  Pain EXAM: CHEST - 2 VIEW COMPARISON:  August 23, 2017. FINDINGS: There is no appreciable edema or consolidation. There is mild left base atelectasis. Heart size and pulmonary vascularity are normal. No  adenopathy. There are multiple air-fluid levels in the visualized bowel. IMPRESSION: Mild left base atelectasis. No edema or consolidation. Heart size  normal. Air-fluid levels noted in multiple bowel loops. Question a degree of ileus or bowel obstruction. Electronically Signed   By: Lowella Grip III M.D.   On: 07/20/2018 09:23    Anti-infectives: Anti-infectives (From admission, onward)   None      Assessment/Plan: Ms. Laidlaw is a 64 with a bowel obstruction involving her colon on CT. No improvement and vomited. Lytes better and no leukocytosis.  NG now Discussed SBFT if no improvement by Wednesday   LOS: 1 day    Virl Cagey 07/21/2018

## 2018-07-22 LAB — COMPREHENSIVE METABOLIC PANEL
ALT: 10 U/L (ref 0–44)
AST: 17 U/L (ref 15–41)
Albumin: 2.9 g/dL — ABNORMAL LOW (ref 3.5–5.0)
Alkaline Phosphatase: 62 U/L (ref 38–126)
Anion gap: 10 (ref 5–15)
BUN: 12 mg/dL (ref 8–23)
CO2: 26 mmol/L (ref 22–32)
Calcium: 8.4 mg/dL — ABNORMAL LOW (ref 8.9–10.3)
Chloride: 100 mmol/L (ref 98–111)
Creatinine, Ser: 0.72 mg/dL (ref 0.44–1.00)
GFR calc Af Amer: >60 mL/min (ref >60)
GFR calc non Af Amer: >60 mL/min (ref >60)
Glucose, Bld: 109 mg/dL — ABNORMAL HIGH (ref 70–99)
Potassium: 3.3 mmol/L — ABNORMAL LOW (ref 3.5–5.1)
Sodium: 136 mmol/L (ref 135–145)
Total Bilirubin: 0.7 mg/dL (ref 0.3–1.2)
Total Protein: 5.6 g/dL — ABNORMAL LOW (ref 6.5–8.1)

## 2018-07-22 LAB — CBC
HCT: 38.9 % (ref 36.0–46.0)
Hemoglobin: 12.4 g/dL (ref 12.0–15.0)
MCH: 27.7 pg (ref 26.0–34.0)
MCHC: 31.9 g/dL (ref 30.0–36.0)
MCV: 87 fL (ref 80.0–100.0)
Platelets: 356 10*3/uL (ref 150–400)
RBC: 4.47 MIL/uL (ref 3.87–5.11)
RDW: 13.8 % (ref 11.5–15.5)
WBC: 4.6 10*3/uL (ref 4.0–10.5)
nRBC: 0 % (ref 0.0–0.2)

## 2018-07-22 LAB — MAGNESIUM: Magnesium: 1.9 mg/dL (ref 1.7–2.4)

## 2018-07-22 MED ORDER — POTASSIUM CHLORIDE 10 MEQ/100ML IV SOLN
10.0000 meq | INTRAVENOUS | Status: AC
  Administered 2018-07-22 (×4): 10 meq via INTRAVENOUS
  Filled 2018-07-22: qty 100

## 2018-07-22 MED ORDER — BISACODYL 10 MG RE SUPP
10.0000 mg | Freq: Once | RECTAL | Status: AC
  Administered 2018-07-22: 10 mg via RECTAL
  Filled 2018-07-22: qty 1

## 2018-07-22 NOTE — Progress Notes (Signed)
Pt accidentally pulled out NG tube while ambulating to the [facility]. Dr. Constance Haw made aware. MD stated to leave out until she comes by to assess patient this afternoon. Pt states she is passing gas and belching, and had a small BM this morning.

## 2018-07-22 NOTE — Progress Notes (Signed)
[facility] Progress Note     Subjective: Ng out accidentally. Did not make much. Flatus and reported small BM but not recorded. Says pain is improving.   Objective: Vital signs in last 24 hours: Temp:  [98.1 F (36.7 C)-99.8 F (37.7 C)] 98.1 F (36.7 C) (01/20 1340) Pulse Rate:  [77-85] 81 (01/20 1340) Resp:  [18] 18 (01/20 1340) BP: (154-170)/(77-86) 170/84 (01/20 1340) SpO2:  [92 %-95 %] 95 % (01/20 1340) Last BM Date: (unknown, patient states maybe 4 days ago)  Intake/Output from previous day: 01/19 0701 - 01/20 0700 In: 1258.7 [I.V.:1258.7] Out: 550 [Urine:550] Intake/Output this shift: Total I/O In: 1193.2 [I.V.:973.8; IV Piggyback:219.5] Out: 800 [Urine:800]  General appearance: alert, cooperative and no distress Resp: normal work of breathing GI: soft, nondistended, tender but distractable   Lab Results:  Recent Labs    07/21/18 0542 07/22/18 0624  WBC 5.3 4.6  HGB 13.2 12.4  HCT 41.7 38.9  PLT 432* 356   BMET Recent Labs    07/21/18 0542 07/22/18 0624  NA 140 136  K 3.8 3.3*  CL 106 100  CO2 26 26  GLUCOSE 142* 109*  BUN 16 12  CREATININE 0.85 0.72  CALCIUM 8.6* 8.4*    Studies/Results: Dg Chest Port 1v Same Day  Result Date: 07/21/2018 CLINICAL DATA:  Check NG placement EXAM: PORTABLE CHEST 1 VIEW COMPARISON:  07/20/2018 FINDINGS: Nasogastric catheter is noted coiled within the stomach. The cardiac shadow is stable. Elevation of the right hemidiaphragm is seen with mild atelectatic changes. No other focal abnormality is noted. IMPRESSION: Nasogastric catheter within the stomach. Mild right basilar atelectasis. Electronically Signed   By: Inez Catalina M.D.   On: 07/21/2018 14:50    Assessment/Plan: Ms. Glick is as 68 yo with multiple prior surgeries and after reviewing her CT with radiology, Dr. Thornton Papas it looks like a SBO but it is difficult to say as her prior colectomies and have altered her anatomy.  Will keep NG out for  now. Suppository. Ambulate. Plan for repeat CT with IV and rectal contrast on Wednesday if no improvement.   Curlene Labrum, MD [facility] [address] [place], [place] [address] [phone] (office)

## 2018-07-22 NOTE — Progress Notes (Signed)
PROGRESS NOTE    Stacy Dalton  PYP:[id number] DOB: July 20, 1950 DOA: 07/20/2018 PCP: [name], FNP   Brief Narrative:  Per HPI: Stacy Dalton a 68 y.o.femalewith medical history significant forchronic pain with lumbago and bilateral knee osteoarthritis, hypothyroidism, anxiety, GERD, hypertension, dyslipidemia, COPD, and multiple prior abdominal surgeries with previous bowel obstructionswho presented to the ED with gradual onset and worsening of generalized abdominal discomfort that began yesterday. This was associated with several episodes of nausea and vomiting and felt similar to prior bowel obstruction that she had several years ago. She does not recall exactly when her last bowel movement was but states that it was perhaps 2 to 3 days ago. She denies any hematemesis, blood in her stool, or melena. No fevers or chills otherwise noted and she denies chest pain or shortness of breath.  Patient has been admitted for partial small bowel obstruction.  She continues to have significant abdominal pain that appears to be better controlled with NG tube placed on 1/19.   Assessment & Plan:   Principal Problem:   Small bowel obstruction (HCC) Active Problems:   Hypertension   HLD (hyperlipidemia)   Hypothyroidism   Chronic pain   Anxiety   GERD (gastroesophageal reflux disease)   Hypokalemia   Hypomagnesemia   Large bowel obstruction (HCC)  1. Partial small bowel obstruction with possible adhesions.  NG tube placed on 1/19 by general surgery. Patient has had multiple abdominal surgeries which makes adhesions very concerning. Appreciate general surgery recommendations and will plan for conservative management at this time with bowel rest and IV fluid. She appears to also have a component of ileus which is likely causing significant amounts of abdominal pain.   Will continue to recheck electrolytes to ensure that these are stable. We will continue Zofran as well as  medication with Dilaudid now for pain control since she cannot tolerate Toradol.  2. Hypokalemia/hypomagnesemia.  Continue on IV fluid with LR.  Magnesium appears to be repleted at this time.  Continues to have recurrent hypokalemia which I will replete today with IV potassium and recheck labs in a.m. 3. Hypertension. IV hydralazine for now continue to monitor closely.   Blood pressure stable for now with home medications well. 4. Hypothyroidism. IV Synthroid  continued for now while n.p.o. 5. Acute on chronic pain.   Will increase IV pain medication to Dilaudid 0.5 mg every 2 hours for breakthrough pain for better pain control.    Holding home tramadol as well as Cymbalta for now.  Pain control appears to be improved. 6. GERD. IV PPI daily. 7. Anxiety. Will have Ativan ordered IV as needed. 8. Dyslipidemia.   Continue to hold statin until diet is resumed.  DVT prophylaxis: Lovenox Code Status: Full Family Communication: None at bedside Disposition Plan: Continue to conservatively manage bowel obstruction-appreciate general surgery recommendations.    Maintain on NG tube and monitor return of bowel function.  Maintain on IV fluid and replace potassium.   Consultants:   General surgery  Procedures:   NG tube placement 1/19  Antimicrobials:   None   Subjective: Patient seen and evaluated today with ongoing, intermittent abdominal pain that appears to be better controlled.  She claims that she is passing flatus and even had a bowel movement overnight.  Objective: Vitals:   07/21/18 1439 07/21/18 2100 07/22/18 0700 07/22/18 0753  BP: (!) 159/75 (!) 163/77 (!) 154/86   Pulse: 82 77 85   Resp: 19 18 18    Temp: 98.8  F (37.1 C) 98.4 F (36.9 C) 99.8 F (37.7 C)   TempSrc: Oral Oral Oral   SpO2: 96% 95% 94% 92%  Weight:      Height:        Intake/Output Summary (Last 24 hours) at 07/22/2018 1155 Last data filed at 07/22/2018 1042 Gross per 24 hour  Intake 1258.73  ml  Output 750 ml  Net 508.73 ml   Filed Weights   07/20/18 0716 07/20/18 1444  Weight: 49.4 kg 50 kg    Examination:  General exam: Appears calm and comfortable  Respiratory system: Clear to auscultation. Respiratory effort normal. Cardiovascular system: S1 & S2 heard, RRR. No JVD, murmurs, rubs, gallops or clicks. No pedal edema. Gastrointestinal system: Abdomen is nondistended, soft and nontender. No organomegaly or masses felt. Normal bowel sounds heard. Central nervous system: Alert and oriented. No focal neurological deficits. Extremities: Symmetric 5 x 5 power. Skin: No rashes, lesions or ulcers Psychiatry: Cannot be evaluated.    Data Reviewed: I have personally reviewed following labs and imaging studies  CBC: Recent Labs  Lab 07/20/18 0743 07/21/18 0542 07/22/18 0624  WBC 11.7* 5.3 4.6  NEUTROABS 10.4*  --   --   HGB 13.5 13.2 12.4  HCT 42.3 41.7 38.9  MCV 86.0 86.9 87.0  PLT 407* 432* 423   Basic Metabolic Panel: Recent Labs  Lab 07/20/18 0743 07/21/18 0542 07/22/18 0624  NA 144 140 136  K 2.8* 3.8 3.3*  CL 101 106 100  CO2 30 26 26   GLUCOSE 151* 142* 109*  BUN 17 16 12   CREATININE 1.02* 0.85 0.72  CALCIUM 9.4 8.6* 8.4*  MG 0.9* 2.3 1.9   GFR: Estimated Creatinine Clearance: 49.4 mL/min (by C-G formula based on SCr of 0.72 mg/dL). Liver Function Tests: Recent Labs  Lab 07/20/18 0743 07/21/18 0542 07/22/18 0624  AST 21 18 17   ALT 13 11 10   ALKPHOS 93 78 62  BILITOT 0.4 0.4 0.7  PROT 7.3 6.0* 5.6*  ALBUMIN 4.0 3.3* 2.9*   Recent Labs  Lab 07/20/18 0743  LIPASE 16   No results for input(s): AMMONIA in the last 168 hours. Coagulation Profile: No results for input(s): INR, PROTIME in the last 168 hours. Cardiac Enzymes: Recent Labs  Lab 07/20/18 0743  TROPONINI <0.03   BNP (last 3 results) No results for input(s): PROBNP in the last 8760 hours. HbA1C: No results for input(s): HGBA1C in the last 72 hours. CBG: No results for  input(s): GLUCAP in the last 168 hours. Lipid Profile: No results for input(s): CHOL, HDL, LDLCALC, TRIG, CHOLHDL, LDLDIRECT in the last 72 hours. Thyroid Function Tests: No results for input(s): TSH, T4TOTAL, FREET4, T3FREE, THYROIDAB in the last 72 hours. Anemia Panel: No results for input(s): VITAMINB12, FOLATE, FERRITIN, TIBC, IRON, RETICCTPCT in the last 72 hours. Sepsis Labs: Recent Labs  Lab 07/20/18 0743 07/20/18 1006  LATICACIDVEN 0.9 0.8    No results found for this or any previous visit (from the past 240 hour(s)).       Radiology Studies: Dg Chest Port 1v Same Day  Result Date: 07/21/2018 CLINICAL DATA:  Check NG placement EXAM: PORTABLE CHEST 1 VIEW COMPARISON:  07/20/2018 FINDINGS: Nasogastric catheter is noted coiled within the stomach. The cardiac shadow is stable. Elevation of the right hemidiaphragm is seen with mild atelectatic changes. No other focal abnormality is noted. IMPRESSION: Nasogastric catheter within the stomach. Mild right basilar atelectasis. Electronically Signed   By: Inez Catalina M.D.   On: 07/21/2018  14:50        Scheduled Meds: . enoxaparin (LOVENOX) injection  40 mg Subcutaneous Q24H  . fluticasone furoate-vilanterol  1 puff Inhalation Daily  . levothyroxine  12.5 mcg Intravenous Daily   Continuous Infusions: . lactated ringers 75 mL/hr at 07/22/18 0013  . potassium chloride       LOS: 2 days    Time spent: 30 minutes    Pratik Darleen Crocker, DO [facility] Pager [phone]  If 7PM-7AM, please contact night-coverage [email or web address] Password [facility] 07/22/2018, 11:55 AM

## 2018-07-23 LAB — COMPREHENSIVE METABOLIC PANEL
ALT: 19 U/L (ref 0–44)
AST: 25 U/L (ref 15–41)
Albumin: 2.7 g/dL — ABNORMAL LOW (ref 3.5–5.0)
Alkaline Phosphatase: 59 U/L (ref 38–126)
Anion gap: 10 (ref 5–15)
BUN: 11 mg/dL (ref 8–23)
CO2: 25 mmol/L (ref 22–32)
Calcium: 8.4 mg/dL — ABNORMAL LOW (ref 8.9–10.3)
Chloride: 103 mmol/L (ref 98–111)
Creatinine, Ser: 0.73 mg/dL (ref 0.44–1.00)
GFR calc Af Amer: >60 mL/min (ref >60)
GFR calc non Af Amer: >60 mL/min (ref >60)
Glucose, Bld: 89 mg/dL (ref 70–99)
Potassium: 3.3 mmol/L — ABNORMAL LOW (ref 3.5–5.1)
Sodium: 138 mmol/L (ref 135–145)
Total Bilirubin: 0.6 mg/dL (ref 0.3–1.2)
Total Protein: 5.3 g/dL — ABNORMAL LOW (ref 6.5–8.1)

## 2018-07-23 LAB — MAGNESIUM: Magnesium: 1.9 mg/dL (ref 1.7–2.4)

## 2018-07-23 MED ORDER — AMLODIPINE BESYLATE 5 MG PO TABS
10.0000 mg | ORAL_TABLET | Freq: Every day | ORAL | Status: DC
  Administered 2018-07-23 – 2018-07-24 (×2): 10 mg via ORAL
  Filled 2018-07-23 (×2): qty 2

## 2018-07-23 MED ORDER — AMLODIPINE-OLMESARTAN 10-40 MG PO TABS: 1.0000 | ORAL_TABLET | Freq: Every day | ORAL | Status: DC

## 2018-07-23 MED ORDER — HYDROCODONE-ACETAMINOPHEN 7.5-325 MG PO TABS
1.0000 | ORAL_TABLET | ORAL | Status: DC | PRN
  Administered 2018-07-23 – 2018-07-24 (×5): 1 via ORAL
  Filled 2018-07-23 (×5): qty 1

## 2018-07-23 MED ORDER — TRAMADOL HCL 50 MG PO TABS
50.0000 mg | ORAL_TABLET | Freq: Three times a day (TID) | ORAL | Status: DC | PRN
  Administered 2018-07-23: 50 mg via ORAL
  Filled 2018-07-23 (×2): qty 1

## 2018-07-23 MED ORDER — LEVOTHYROXINE SODIUM 25 MCG PO TABS
25.0000 ug | ORAL_TABLET | Freq: Every day | ORAL | Status: DC
  Administered 2018-07-24: 25 ug via ORAL
  Filled 2018-07-23: qty 1

## 2018-07-23 MED ORDER — DULOXETINE HCL 60 MG PO CPEP
60.0000 mg | ORAL_CAPSULE | Freq: Every day | ORAL | Status: DC
  Administered 2018-07-23 – 2018-07-24 (×2): 60 mg via ORAL
  Filled 2018-07-23 (×2): qty 1

## 2018-07-23 MED ORDER — IRBESARTAN 300 MG PO TABS
300.0000 mg | ORAL_TABLET | Freq: Every day | ORAL | Status: DC
  Administered 2018-07-23 – 2018-07-24 (×2): 300 mg via ORAL
  Filled 2018-07-23 (×2): qty 1

## 2018-07-23 MED ORDER — POTASSIUM CHLORIDE CRYS ER 20 MEQ PO TBCR
40.0000 meq | EXTENDED_RELEASE_TABLET | Freq: Once | ORAL | Status: AC
  Administered 2018-07-23: 40 meq via ORAL
  Filled 2018-07-23: qty 2

## 2018-07-23 MED ORDER — TEMAZEPAM 15 MG PO CAPS
30.0000 mg | ORAL_CAPSULE | Freq: Every evening | ORAL | Status: DC | PRN
  Administered 2018-07-24: 30 mg via ORAL
  Filled 2018-07-23: qty 2

## 2018-07-23 NOTE — Progress Notes (Signed)
[facility] Progress Note     Subjective: Doing well. Multiple BMs. Started on clears and had more BMs. Less abd pain.   Objective: Vital signs in last 24 hours: Temp:  [98.2 F (36.8 C)-99.2 F (37.3 C)] 98.2 F (36.8 C) (01/21 1347) Pulse Rate:  [73-80] 80 (01/21 1347) Resp:  [16-18] 17 (01/21 1347) BP: (136-146)/(70-81) 141/70 (01/21 1347) SpO2:  [95 %-98 %] 98 % (01/21 1347) Last BM Date: 07/23/18  Intake/Output from previous day: 01/20 0701 - 01/21 0700 In: 2487.3 [I.V.:2267.8; IV Piggyback:219.5] Out: 800 [Urine:800] Intake/Output this shift: No intake/output data recorded.  General appearance: alert, cooperative and no distress Resp: normal work breathing GI: soft, nondistended, less tender  Lab Results:  Recent Labs    07/21/18 0542 07/22/18 0624  WBC 5.3 4.6  HGB 13.2 12.4  HCT 41.7 38.9  PLT 432* 356   BMET Recent Labs    07/22/18 0624 07/23/18 0615  NA 136 138  K 3.3* 3.3*  CL 100 103  CO2 26 25  GLUCOSE 109* 89  BUN 12 11  CREATININE 0.72 0.73  CALCIUM 8.4* 8.4*    Assessment/Plan: Stacy Dalton is a 68 yo with a resolving SBO. Have started on clears and had more BMs. Wrote for soft diet in the AM. Can d/c home if tolerates soft diet. Follow up with PCP.    LOS: 3 days    Virl Cagey 07/23/2018

## 2018-07-23 NOTE — Progress Notes (Signed)
PROGRESS NOTE    Stacy Dalton  MLY:[id number] DOB: 06-11-1951 DOA: 07/20/2018 PCP: [name], FNP   Brief Narrative:  Per HPI: Stacy Tonche Atkinsonis a 68 y.o.femalewith medical history significant forchronic pain with lumbago and bilateral knee osteoarthritis, hypothyroidism, anxiety, GERD, hypertension, dyslipidemia, COPD, and multiple prior abdominal surgeries with previous bowel obstructionswho presented to the ED with gradual onset and worsening of generalized abdominal discomfort that began yesterday. This was associated with several episodes of nausea and vomiting and felt similar to prior bowel obstruction that she had several years ago. She does not recall exactly when her last bowel movement was but states that it was perhaps 2 to 3 days ago. She denies any hematemesis, blood in her stool, or melena. No fevers or chills otherwise noted and she denies chest pain or shortness of breath.  Patient has been admitted for partial small bowel obstruction. She continues to have significant abdominal pain that appears to be better controlled with NG tube placed on 1/19, this has subsequently been removed on 1/20.  She appears to be tolerating clear liquid diet but has ongoing pain.  Assessment & Plan:   Principal Problem:   Small bowel obstruction (HCC) Active Problems:   Hypertension   HLD (hyperlipidemia)   Hypothyroidism   Chronic pain   Anxiety   GERD (gastroesophageal reflux disease)   Hypokalemia   Hypomagnesemia   Large bowel obstruction (HCC)  1. Partial small bowel obstruction with possible adhesions.  NG tube placed on 1/19 by general surgery which has now been removed on 1/20.  Continue advance diet as otherwise tolerated as patient is on clear liquid.  Appreciate general surgery recommendations with possible need for repeat imaging should symptoms continue. 2. Hypokalemia.  Appears to be persistent and will replete orally today.  Recheck labs in  a.m. 3. Hypertension. IV hydralazine for now continue to monitor closely.Home medications to be started today.  Monitor closely. 4. Hypothyroidism. IV Synthroid continued for now while n.p.o. 5. Acute on chronic abdominal pain.Will change to oral pain medications to include home tramadol as well as Norco.  Resume home Cymbalta as well given the fact that she is tolerating clear liquids. 6. GERD. IV PPI daily. 7. Anxiety. Will have Ativan ordered IV as needed. 8. Dyslipidemia.Continue to hold statin until diet is further advanced.  DVT prophylaxis:Lovenox Code Status:Full Family Communication:None at bedside Disposition Plan:Continue to conservatively manage bowel obstruction-appreciate general surgery recommendations.     Advance diet as tolerated.  Replete potassium and recheck in a.m.   Consultants:  General surgery  Procedures:  NG tube placement 1/19-removed on 1/20  Antimicrobials:   None  Subjective: Patient seen and evaluated today with no further nausea or vomiting noted.  It appears that she is tolerating her clear liquids this morning.  She continues to complain of ongoing abdominal and low back pain.  Objective: Vitals:   07/22/18 2116 07/23/18 0459 07/23/18 0933 07/23/18 1347  BP: 136/81 (!) 146/72  (!) 141/70  Pulse: 77 73  80  Resp: 16 18  17   Temp: 99.1 F (37.3 C) 99.2 F (37.3 C)  98.2 F (36.8 C)  TempSrc: Oral Oral  Oral  SpO2: 95% 97% 96% 98%  Weight:      Height:        Intake/Output Summary (Last 24 hours) at 07/23/2018 1403 Last data filed at 07/23/2018 1230 Gross per 24 hour  Intake 2727.28 ml  Output 600 ml  Net 2127.28 ml   [employer]  07/20/18 0716 07/20/18 1444  Weight: 49.4 kg 50 kg    Examination:  General exam: Appears calm and comfortable  Respiratory system: Clear to auscultation. Respiratory effort normal. Cardiovascular system: S1 & S2 heard, RRR. No JVD, murmurs, rubs, gallops or clicks. No  pedal edema. Gastrointestinal system: Abdomen is nondistended, soft and nontender. No organomegaly or masses felt. Normal bowel sounds heard. Central nervous system: Alert and oriented. No focal neurological deficits. Extremities: Symmetric 5 x 5 power. Skin: No rashes, lesions or ulcers Psychiatry: Judgement and insight appear normal. Mood & affect appropriate.     Data Reviewed: I have personally reviewed following labs and imaging studies  CBC: Recent Labs  Lab 07/20/18 0743 07/21/18 0542 07/22/18 0624  WBC 11.7* 5.3 4.6  NEUTROABS 10.4*  --   --   HGB 13.5 13.2 12.4  HCT 42.3 41.7 38.9  MCV 86.0 86.9 87.0  PLT 407* 432* 782   Basic Metabolic Panel: Recent Labs  Lab 07/20/18 0743 07/21/18 0542 07/22/18 0624 07/23/18 0615  NA 144 140 136 138  K 2.8* 3.8 3.3* 3.3*  CL 101 106 100 103  CO2 30 26 26 25   GLUCOSE 151* 142* 109* 89  BUN 17 16 12 11   CREATININE 1.02* 0.85 0.72 0.73  CALCIUM 9.4 8.6* 8.4* 8.4*  MG 0.9* 2.3 1.9 1.9   GFR: Estimated Creatinine Clearance: 49.4 mL/min (by C-G formula based on SCr of 0.73 mg/dL). Liver Function Tests: Recent Labs  Lab 07/20/18 0743 07/21/18 0542 07/22/18 0624 07/23/18 0615  AST 21 18 17 25   ALT 13 11 10 19   ALKPHOS 93 78 62 59  BILITOT 0.4 0.4 0.7 0.6  PROT 7.3 6.0* 5.6* 5.3*  ALBUMIN 4.0 3.3* 2.9* 2.7*   Recent Labs  Lab 07/20/18 0743  LIPASE 16   No results for input(s): AMMONIA in the last 168 hours. Coagulation Profile: No results for input(s): INR, PROTIME in the last 168 hours. Cardiac Enzymes: Recent Labs  Lab 07/20/18 0743  TROPONINI <0.03   BNP (last 3 results) No results for input(s): PROBNP in the last 8760 hours. HbA1C: No results for input(s): HGBA1C in the last 72 hours. CBG: No results for input(s): GLUCAP in the last 168 hours. Lipid Profile: No results for input(s): CHOL, HDL, LDLCALC, TRIG, CHOLHDL, LDLDIRECT in the last 72 hours. Thyroid Function Tests: No results for input(s):  TSH, T4TOTAL, FREET4, T3FREE, THYROIDAB in the last 72 hours. Anemia Panel: No results for input(s): VITAMINB12, FOLATE, FERRITIN, TIBC, IRON, RETICCTPCT in the last 72 hours. Sepsis Labs: Recent Labs  Lab 07/20/18 0743 07/20/18 1006  LATICACIDVEN 0.9 0.8    No results found for this or any previous visit (from the past 240 hour(s)).       Radiology Studies: Dg Chest Port 1v Same Day  Result Date: 07/21/2018 CLINICAL DATA:  Check NG placement EXAM: PORTABLE CHEST 1 VIEW COMPARISON:  07/20/2018 FINDINGS: Nasogastric catheter is noted coiled within the stomach. The cardiac shadow is stable. Elevation of the right hemidiaphragm is seen with mild atelectatic changes. No other focal abnormality is noted. IMPRESSION: Nasogastric catheter within the stomach. Mild right basilar atelectasis. Electronically Signed   By: Inez Catalina M.D.   On: 07/21/2018 14:50        Scheduled Meds: . amLODipine  10 mg Oral Daily  . DULoxetine  60 mg Oral Daily  . enoxaparin (LOVENOX) injection  40 mg Subcutaneous Q24H  . fluticasone furoate-vilanterol  1 puff Inhalation Daily  . irbesartan  300  mg Oral Daily  . [START ON 07/24/2018] levothyroxine  25 mcg Oral QAC breakfast   Continuous Infusions:    LOS: 3 days    Time spent: 30 minutes    Phelan Schadt Darleen Crocker, DO [facility] Pager [phone]  If 7PM-7AM, please contact night-coverage [email or web address] Password [email or web address] 07/23/2018, 2:03 PM

## 2018-07-23 NOTE — Progress Notes (Signed)
Pt has no IV access. MD made aware and states it is ok to leave out since multiple unsuccessful attempts have been made. Pt is tolerating PO meds and diet, so MD also stated this was ok to leave the IV out.

## 2018-07-24 LAB — BASIC METABOLIC PANEL
Anion gap: 7 (ref 5–15)
BUN: 7 mg/dL — ABNORMAL LOW (ref 8–23)
CO2: 27 mmol/L (ref 22–32)
Calcium: 8.6 mg/dL — ABNORMAL LOW (ref 8.9–10.3)
Chloride: 103 mmol/L (ref 98–111)
Creatinine, Ser: 0.71 mg/dL (ref 0.44–1.00)
GFR calc Af Amer: >60 mL/min (ref >60)
GFR calc non Af Amer: >60 mL/min (ref >60)
Glucose, Bld: 97 mg/dL (ref 70–99)
Potassium: 3.3 mmol/L — ABNORMAL LOW (ref 3.5–5.1)
Sodium: 137 mmol/L (ref 135–145)

## 2018-07-24 LAB — CBC
HCT: 35.5 % — ABNORMAL LOW (ref 36.0–46.0)
Hemoglobin: 11.5 g/dL — ABNORMAL LOW (ref 12.0–15.0)
MCH: 28.2 pg (ref 26.0–34.0)
MCHC: 32.4 g/dL (ref 30.0–36.0)
MCV: 87 fL (ref 80.0–100.0)
Platelets: 267 10*3/uL (ref 150–400)
RBC: 4.08 MIL/uL (ref 3.87–5.11)
RDW: 13.2 % (ref 11.5–15.5)
WBC: 5.8 10*3/uL (ref 4.0–10.5)
nRBC: 0 % (ref 0.0–0.2)

## 2018-07-24 LAB — MAGNESIUM: Magnesium: 1.8 mg/dL (ref 1.7–2.4)

## 2018-07-24 MED ORDER — HYDROCODONE-ACETAMINOPHEN 7.5-325 MG PO TABS
1.0000 | ORAL_TABLET | ORAL | Status: DC | PRN
  Administered 2018-07-24: 1 via ORAL
  Filled 2018-07-24: qty 1

## 2018-07-24 MED ORDER — POTASSIUM CHLORIDE ER 20 MEQ PO TBCR: 20.0000 meq | EXTENDED_RELEASE_TABLET | Freq: Every day | ORAL | 0 refills | Status: DC

## 2018-07-24 MED ORDER — FAMOTIDINE 20 MG PO TABS: 20.0000 mg | ORAL_TABLET | Freq: Two times a day (BID) | ORAL | 1 refills | Status: DC

## 2018-07-24 MED ORDER — TRAMADOL HCL 50 MG PO TABS: 50.0000 mg | ORAL_TABLET | Freq: Three times a day (TID) | ORAL | 0 refills | Status: DC | PRN

## 2018-07-24 MED ORDER — POTASSIUM CHLORIDE CRYS ER 20 MEQ PO TBCR
40.0000 meq | EXTENDED_RELEASE_TABLET | Freq: Two times a day (BID) | ORAL | Status: DC
  Administered 2018-07-24: 40 meq via ORAL
  Filled 2018-07-24: qty 2

## 2018-07-24 MED ORDER — ONDANSETRON HCL 4 MG PO TABS: 4.0000 mg | ORAL_TABLET | Freq: Four times a day (QID) | ORAL | 0 refills | Status: DC | PRN

## 2018-07-24 NOTE — Discharge Summary (Signed)
Stacy Dalton, is a 68 y.o. female  DOB 11/09/1950  MRN [id number].  Admission date:  07/20/2018  Admitting Physician  [place], DO  Discharge Date:  07/24/2018   Primary MD  [name], FNP  Recommendations for primary care physician for things to follow:  1) soft diet advised--- drink plenty fluids 2)Avoid ibuprofen/Advil/Aleve/Motrin/Goody Powders/Naproxen/BC powders/Meloxicam/Diclofenac/Indomethacin and other Nonsteroidal anti-inflammatory medications as these will make you more likely to bleed and can cause stomach ulcers, can also cause Kidney problems.  3) follow-up with PCP for recheck in 1 to 2 weeks   Admission Diagnosis  Hypokalemia [E87.6] Hypomagnesemia [E83.42] Partial small bowel obstruction (HCC) [K56.600] Nausea and vomiting in adult patient [R11.2]  Discharge Diagnosis  Hypokalemia [E87.6] Hypomagnesemia [E83.42] Partial small bowel obstruction (HCC) [K56.600] Nausea and vomiting in adult patient [R11.2]    Principal Problem:   Small bowel obstruction ([place]) Active Problems:   Hypertension   HLD (hyperlipidemia)   Hypothyroidism   Chronic pain   Anxiety   GERD (gastroesophageal reflux disease)   Hypokalemia   Hypomagnesemia   Large bowel obstruction (HCC)      Past Medical History:  Diagnosis Date  . Chronic pain   . COPD (chronic obstructive pulmonary disease) ([place])    told has copd, no current inhaler use  . Cough   . Depression   . GERD (gastroesophageal reflux disease)   . Hypertension   . Hypothyroidism   . Insomnia   . Migraine   . Migraine   . Osteopenia   . Pain management   . Panic attacks   . Small bowel obstruction [facility])     Past Surgical History:  Procedure Laterality Date  . ABDOMINAL HYSTERECTOMY    . COLON SURGERY    . COLOSTOMY CLOSURE  04/19/2012   Procedure: COLOSTOMY CLOSURE;  Surgeon: Adin Hector, MD;  Location: [facility]  ORS;  Service: General;  Laterality: N/A;  Laparotomy, Resection and Closure of Colostomy  . COLOSTOMY TAKEDOWN N/A 04/12/2016   Procedure: [place] TAKEDOWN;  Surgeon: Johnathan Hausen, MD;  Location: [facility] ORS;  Service: General;  Laterality: N/A;  . INCONTINENCE SURGERY    . LAPAROTOMY  10/04/2011, colostomy also   Procedure: EXPLORATORY LAPAROTOMY;  Surgeon: Adin Hector, MD;  Location: [facility] ORS;  Service: General;  Laterality: N/A;  left partial colectomy with colostomy  . LAPAROTOMY  04/19/2012   Procedure: EXPLORATORY LAPAROTOMY;  Surgeon: Adin Hector, MD;  Location: [facility] ORS;  Service: General;  Laterality: N/A;  . LAPAROTOMY N/A 11/29/2015   Procedure: EXPLORATORY LAPAROTOMY; SUBTOTAL COLECTOMY WITH HARTMAN PROCEDURE AND END COLOSTOMY;  Surgeon: Johnathan Hausen, MD;  Location: [facility] ORS;  Service: General;  Laterality: N/A;  . TUBAL LIGATION    . VENTRAL HERNIA REPAIR  04/19/2012   Procedure: HERNIA REPAIR VENTRAL ADULT;  Surgeon: Adin Hector, MD;  Location: [facility] ORS;  Service: General;  Laterality: N/A;       HPI  from the history and physical done on the day of admission:  Chief Complaint: Abdominal pain with nausea and vomiting  HPI: Stacy Dalton is a 68 y.o. female with medical history significant for chronic pain with lumbago and bilateral knee osteoarthritis, hypothyroidism, anxiety, GERD, hypertension, dyslipidemia, COPD, and multiple prior abdominal surgeries with previous bowel obstructions who presented to the ED with gradual onset and worsening of generalized abdominal discomfort that began yesterday.  This was associated with several episodes of nausea and vomiting and felt similar to prior bowel obstruction that she had several years ago.  She does not recall exactly when her last bowel movement was but states that it was perhaps 2 to 3 days ago.  She denies any hematemesis, blood in her stool, or melena.  No fevers or chills otherwise noted and she denies chest pain or  shortness of breath.   ED Course: Vital signs are noted to be stable.  Laboratory data with minimal leukocytosis of 11,700, but with potassium of 2.8 and magnesium of 0.9.  Her creatinine is 1.02 and not far from her usual baseline with indication of CKD stage III.  Two-view chest x-ray demonstrated some left-sided atelectasis at the lung base, but with air-fluid levels and multiple bowel loops that were thought to resemble partial small bowel obstruction or ileus.  This was confirmed on CT abdomen.  EDP had contacted general surgery who will see patient in consultation.  She has been given some IV fluid as well as potassium and magnesium repletion and morphine for abdominal pain.  She is also been given some Zofran for nausea and vomiting.  She states that she would like to have some liquids to try, but continues to complain of significant abdominal pain.     Hospital Course:    Brief Narrative:  Per HPI: Stacy Raffel Atkinsonis a 68 y.o.femalewith medical history significant forchronic pain with lumbago and bilateral knee osteoarthritis, hypothyroidism, anxiety, GERD, hypertension, dyslipidemia, COPD, and multiple prior abdominal surgeries with previous bowel obstructionswho presented to the ED with gradual onset and worsening of generalized abdominal discomfort that began yesterday. This was associated with several episodes of nausea and vomiting and felt similar to prior bowel obstruction that she had several years ago. She does not recall exactly when her last bowel movement was but states that it was perhaps 2 to 3 days ago. She denies any hematemesis, blood in her stool, or melena. No fevers or chills otherwise noted and she denies chest pain or shortness of breath.  Patient has been admitted for partial small bowel obstruction. She continues to have significant abdominal painthat appears to be better controlled with NG tube placed on 1/19, this has subsequently been removed on 1/20.     Tolerating oral intake well, now eager to go home  Assessment & Plan:   Principal Problem:   Small bowel obstruction ([place]) Active Problems:   Hypertension   HLD (hyperlipidemia)   Hypothyroidism   Chronic pain   Anxiety   GERD (gastroesophageal reflux disease)   Hypokalemia   Hypomagnesemia   Large bowel obstruction ([place])  1. Partial small bowel obstruction with possible adhesions.NG tube placed on 1/19 by general surgery which has now been removed on 1/20.   Appreciate general surgery recommendations ,Clinically Much improved, tolerating oral intake well  , hypokalemia was replaced    2. Hypertension. --- Stable, continue amlodipine/olmesartan   3. Hypothyroidism. Stable, continue levothyroxine 25 mcg daily     4. Anxiety/Insomnia--- stable, okay to resume Cymbalta, and temazepam nightly  5)Dyslipidemia.Stable okay to resume Lipitor  Code Status:Full Family Communication:None at bedside Disposition Plan:Tolerating solid food, okay to discharge home  Consultants:  General surgery  Procedures:  NG tube placement 1/19-removed on 1/20  Antimicrobials:   None  Discharge Condition: stable  Follow UP----PCP 1 to 2 weeks for recheck  Diet and Activity recommendation:  As advised  Discharge Instructions    Discharge Instructions    Call MD for:  difficulty breathing, headache or visual disturbances   Complete by:  As directed    Call MD for:  persistant dizziness or light-headedness   Complete by:  As directed    Call MD for:  persistant nausea and vomiting   Complete by:  As directed    Call MD for:  severe uncontrolled pain   Complete by:  As directed    Call MD for:  temperature >100.4   Complete by:  As directed    Diet - low sodium heart healthy   Complete by:  As directed    Discharge instructions   Complete by:  As directed    1) soft diet advised--- drink plenty fluids 2)Avoid ibuprofen/Advil/Aleve/Motrin/Goody  Powders/Naproxen/BC powders/Meloxicam/Diclofenac/Indomethacin and other Nonsteroidal anti-inflammatory medications as these will make you more likely to bleed and can cause stomach ulcers, can also cause Kidney problems.  3) follow-up with PCP for recheck in 1 to 2 weeks   Increase activity slowly   Complete by:  As directed         Discharge Medications     Allergies as of 07/24/2018      Reactions   Toradol [ketorolac Tromethamine] Shortness Of Breath   Butalbital-apap-caffeine Other (See Comments)   jittery   Demerol Swelling   Esgic [butalbital-apap-caffeine] Other (See Comments)   jittery   Iodine Other (See Comments)   bp bottomed out per pt several hours later; unsure if pre medicated in past with cm; done in [place]      Medication List    STOP taking these medications   doxycycline 100 MG tablet Commonly known as:  VIBRA-TABS   oseltamivir 75 MG capsule Commonly known as:  TAMIFLU     TAKE these medications   amLODipine-olmesartan 10-40 MG tablet Commonly known as:  AZOR Take 1 tablet by mouth daily.   atorvastatin 20 MG tablet Commonly known as:  LIPITOR Take 1 tablet (20 mg total) by mouth daily.   BC HEADACHE POWDER PO Take 1 Dose by mouth daily as needed.   BREO ELLIPTA IN Inhale 1 puff into the lungs daily.   DULoxetine 60 MG capsule Commonly known as:  CYMBALTA Take 1 capsule (60 mg total) by mouth daily.   famotidine 20 MG tablet Commonly known as:  PEPCID Take 1 tablet (20 mg total) by mouth 2 (two) times daily. What changed:  when to take this   HYDROcodone-homatropine 5-1.5 MG/5ML syrup Commonly known as:  HYCODAN Take 5 mLs by mouth every 8 (eight) hours as needed for cough.   levothyroxine 25 MCG tablet Commonly known as:  SYNTHROID, LEVOTHROID Take 1 tablet (25 mcg total) by mouth daily before breakfast.   ondansetron 4 MG disintegrating tablet Commonly known as:  ZOFRAN-ODT DISSOLVE 1 TABLET(4 MG) ON THE TONGUE EVERY 8 HOURS AS  NEEDED FOR NAUSEA OR VOMITING What changed:    how much to take  how to take this  when to take this  reasons to take this   ondansetron 4 MG tablet Commonly known as:  ZOFRAN Take 1 tablet (4 mg total) by mouth  every 6 (six) hours as needed for nausea.   Potassium Chloride ER 20 MEQ Tbcr Take 20 mEq by mouth daily for 5 days. 1 tab daily by mouth   temazepam 30 MG capsule Commonly known as:  RESTORIL Take 1 capsule (30 mg total) by mouth at bedtime as needed for sleep.   traMADol 50 MG tablet Commonly known as:  ULTRAM Take 1 tablet (50 mg total) by mouth every 8 (eight) hours as needed (pain).   VENTOLIN HFA 108 (90 Base) MCG/ACT inhaler Generic drug:  albuterol Inhale 2 puffs into the lungs every 6 (six) hours as needed for wheezing or shortness of breath.   Vitamin D (Ergocalciferol) 1.25 MG (50000 UT) Caps capsule Commonly known as:  DRISDOL TAKE 1 CAPSULE (50000 UNITS) BY MOUTH EVERY 7 DAYS What changed:    how much to take  how to take this       Major procedures and Radiology Reports - PLEASE review detailed and final reports for all details, in brief -  Ct Abdomen Pelvis Wo Contrast  Result Date: 07/20/2018 CLINICAL DATA:  Per ED note. Pt states she has been having abdominal pain since yesterday with constant nausea and vomiting x 1 this morning. Is concerned it could be another bowel obstruction. Hx of COPD,GERD,HTN, SBO, Multiple bowel surgeries including hernia repair,colostomy and colostomy closure. EXAM: CT ABDOMEN AND PELVIS WITHOUT CONTRAST TECHNIQUE: Multidetector CT imaging of the abdomen and pelvis was performed following the standard protocol without IV contrast. COMPARISON:  11/29/2015 FINDINGS: Lower chest: No acute abnormality. Hepatobiliary: No focal liver abnormality is seen. No gallstones, gallbladder wall thickening, or biliary dilatation. Pancreas: Unremarkable. No pancreatic ductal dilatation or surrounding inflammatory changes. Spleen: Normal  in size without focal abnormality. Adrenals/Urinary Tract: Adrenal glands are unremarkable. Kidneys are normal, without renal calculi, focal lesion, or hydronephrosis. Bladder is unremarkable. Stomach/Bowel: The small bowel is dilated with air-fluid levels, maximum dilation of 3.8 cm. There is an anastomosis staple line along sigmoid colon, with this portion the colon mildly distended with stool. Colon extends superior to this along the mid abdomen towards the right upper quadrant. In the mid abdomen, just to the right of midline, there is an apparent transition site. There is no bowel thickening. There is a small amount ascites collecting adjacent to the liver and spleen and the small bowel mesentery and posterior pelvic recess. Vascular/Lymphatic: Aortic atherosclerosis. No aneurysm. No lymphadenopathy. Reproductive: Status post hysterectomy. No adnexal masses. Other: No abdominal wall hernia. Musculoskeletal: No fracture or acute finding. No osteoblastic or osteolytic lesions. IMPRESSION: 1. Partial bowel obstruction. The site of obstruction appears to be the residual colon in the right mid abdomen, with obstruction likely on the basis adhesions. 2. Small amount of associated ascites. 3. No other acute abnormality within the abdomen or pelvis. No evidence of bowel ischemia or free air. 4. Aortic atherosclerosis. Electronically Signed   By: Lajean Manes M.D.   On: 07/20/2018 09:30   Dg Chest 2 View  Result Date: 07/20/2018 CLINICAL DATA:  Pain EXAM: CHEST - 2 VIEW COMPARISON:  August 23, 2017. FINDINGS: There is no appreciable edema or consolidation. There is mild left base atelectasis. Heart size and pulmonary vascularity are normal. No adenopathy. There are multiple air-fluid levels in the visualized bowel. IMPRESSION: Mild left base atelectasis. No edema or consolidation. Heart size normal. Air-fluid levels noted in multiple bowel loops. Question a degree of ileus or bowel obstruction. Electronically  Signed   By: Lowella Grip III M.D.  On: 07/20/2018 09:23   Dg Chest Port 1v Same Day  Result Date: 07/21/2018 CLINICAL DATA:  Check NG placement EXAM: PORTABLE CHEST 1 VIEW COMPARISON:  07/20/2018 FINDINGS: Nasogastric catheter is noted coiled within the stomach. The cardiac shadow is stable. Elevation of the right hemidiaphragm is seen with mild atelectatic changes. No other focal abnormality is noted. IMPRESSION: Nasogastric catheter within the stomach. Mild right basilar atelectasis. Electronically Signed   By: Inez Catalina M.D.   On: 07/21/2018 14:50    Micro Results   No results found for this or any previous visit (from the past 240 hour(s)).     Today   Subjective    Rhylin Venters today has no new complaints, tolerating solid food well, eager to go home  No nausea, no vomiting, denies significant abdominal pain          Patient has been seen and examined prior to discharge   Objective   Blood pressure 124/68, pulse 68, temperature 98.3 F (36.8 C), temperature source Oral, resp. rate 15, height 5\' 1"  (1.549 m), weight 50 kg, SpO2 95 %.   Intake/Output Summary (Last 24 hours) at 07/24/2018 1111 Last data filed at 07/24/2018 0545 Gross per 24 hour  Intake 720 ml  Output -  Net 720 ml    Exam Gen:- Awake Alert, no acute distress  HEENT:- [place].AT, No sclera icterus Neck-Supple Neck,No JVD,.  Lungs-  CTAB , good air movement bilaterally  CV- S1, S2 normal, regular Abd-  +ve B.Sounds, Abd Soft, No tenderness,    Extremity/Skin:- No  edema,   good pulses Psych-affect is appropriate, oriented x3 Neuro-no new focal deficits, no tremors    Data Review   CBC w Diff:  Lab Results  Component Value Date   WBC 5.8 07/24/2018   HGB 11.5 (L) 07/24/2018   HGB 14.0 05/21/2018   HCT 35.5 (L) 07/24/2018   HCT 42.6 05/21/2018   PLT 267 07/24/2018   PLT 400 05/21/2018   LYMPHOPCT 5 07/20/2018   MONOPCT 5 07/20/2018   EOSPCT 1 07/20/2018   BASOPCT 0 07/20/2018     CMP:  Lab Results  Component Value Date   NA 137 07/24/2018   NA 141 05/21/2018   K 3.3 (L) 07/24/2018   CL 103 07/24/2018   CO2 27 07/24/2018   BUN 7 (L) 07/24/2018   BUN 15 05/21/2018   CREATININE 0.71 07/24/2018   CREATININE 0.79 11/06/2012   PROT 5.3 (L) 07/23/2018   PROT 7.0 05/21/2018   ALBUMIN 2.7 (L) 07/23/2018   ALBUMIN 4.9 (H) 05/21/2018   BILITOT 0.6 07/23/2018   BILITOT 0.3 05/21/2018   ALKPHOS 59 07/23/2018   AST 25 07/23/2018   ALT 19 07/23/2018  .   Total Discharge time is about 33 minutes  Roxan Hockey M.D on 07/24/2018 at 11:11 AM  Go to [email or web address] -  for contact info  [facility] - Office  [phone]

## 2018-07-24 NOTE — Discharge Instructions (Signed)
1) soft diet advised--- drink plenty fluids 2)Avoid ibuprofen/Advil/Aleve/Motrin/Goody Powders/Naproxen/BC powders/Meloxicam/Diclofenac/Indomethacin and other Nonsteroidal anti-inflammatory medications as these will make you more likely to bleed and can cause stomach ulcers, can also cause Kidney problems.  3) follow-up with PCP for recheck in 1 to 2 weeks

## 2018-07-24 NOTE — Care Management Important Message (Signed)
Important Message  Patient Details  Name: Stacy Dalton MRN: [id number] Date of Birth: December 26, 1950   Medicare Important Message Given:  Yes    Shelda Altes 07/24/2018, 11:13 AM

## 2018-07-24 NOTE — Progress Notes (Signed)
Discharge instructions reviewed with patient, all questions answered. Pt has no IV. Escorted patient via wheelchair.

## 2018-07-30 ENCOUNTER — Other Ambulatory Visit: Payer: Self-pay

## 2018-07-30 NOTE — Patient Outreach (Signed)
[facility]) Care Management  07/30/2018  Stacy Dalton December 14, 1950 [id number]   EMMI: general discharge red alert Referral date: 07/30/18 Referral reason: lost interest in things: yes,  Sad/ hopeless/ anxious/ empty: yes Insurance: Medicare Day # 4  Telephone call to patient regarding EMMI general discharge red alert. HIPAA verified with patient. Explained reason for call.   Patient states she has felt some depression like symptoms. Patient states the symptoms come and go. She states she lost her husband 6 years ago and still deals with this off and on.  RNCM inquired if patient had ever received grief counseling. Patient states she went to a few group sessions for grief after her husband passed away but has not gone anymore.  RNCM discussed and offered [facility] social work services for grief counseling follow up. Patient declined stating, " that may be something for later on."  Patient states she has grand children and has to manage her time.  RNCM offered to send patient [facility] care management brochure/ magnet for future reference.  Patient verbally agreed.  Patient states she is still having some abdominal pain since discharge from the hospital.  She reports yesterday was a good day without pain. She states she is still having some pain today.  Patient states she has had a bowel movement everyday for the most part since being home from the hospital.  Patient states her discharge instructions states she is to see her primary MD within 1-2 weeks. She states she has not scheduled yet. RNCM advised patient to call her primary care provider office today and schedule her hospital follow up visit. Advised her it was important to see her primary provider as soon as possible especially since she continues to have symptoms. Patient verbalized understanding and agreement.  Patient states she takes her medications as prescribed and states she has transportation to her appointments.  RNCM advised  patient to notify MD of any changes in condition prior to scheduled appointment. RNCM provided contact name and number: [phone] or main office number [phone] and 24 hour nurse advise line [phone] by mail as requested by patient.  RNCM verified patient aware of 911 services for urgent/ emergent needs.   PLAN: RNCM will close patient due to patient being assessed and having no further needs.  RNCM will send patient [facility] care management brochure/ magnet as discussed.   Quinn Plowman RN,BSN,CCM [facility] Telephonic  [phone]

## 2018-08-01 ENCOUNTER — Ambulatory Visit: Payer: Medicare Other | Admitting: Pulmonary Disease

## 2018-08-09 ENCOUNTER — Other Ambulatory Visit: Payer: Self-pay | Admitting: Family

## 2018-08-09 DIAGNOSIS — R053 Chronic cough: Secondary | ICD-10-CM

## 2018-08-09 DIAGNOSIS — R05 Cough: Secondary | ICD-10-CM

## 2018-08-09 DIAGNOSIS — J449 Chronic obstructive pulmonary disease, unspecified: Secondary | ICD-10-CM

## 2018-08-09 MED ORDER — HYDROCODONE-HOMATROPINE 5-1.5 MG/5ML PO SYRP: 5.0000 mL | ORAL_SOLUTION | Freq: Three times a day (TID) | ORAL | 0 refills | Status: DC | PRN

## 2018-08-14 ENCOUNTER — Encounter: Payer: Self-pay | Admitting: Family

## 2018-08-14 ENCOUNTER — Ambulatory Visit (INDEPENDENT_AMBULATORY_CARE_PROVIDER_SITE_OTHER): Payer: Medicare Other | Admitting: Family

## 2018-08-14 VITALS — BP 138/83 | HR 102 | Temp 98.0°F | Ht 61.0 in | Wt 103.2 lb

## 2018-08-14 DIAGNOSIS — Z0289 Encounter for other administrative examinations: Secondary | ICD-10-CM | POA: Diagnosis not present

## 2018-08-14 DIAGNOSIS — G894 Chronic pain syndrome: Secondary | ICD-10-CM

## 2018-08-14 DIAGNOSIS — G8929 Other chronic pain: Secondary | ICD-10-CM | POA: Diagnosis not present

## 2018-08-14 DIAGNOSIS — R05 Cough: Secondary | ICD-10-CM

## 2018-08-14 DIAGNOSIS — Z933 Colostomy status: Secondary | ICD-10-CM | POA: Diagnosis not present

## 2018-08-14 DIAGNOSIS — F1120 Opioid dependence, uncomplicated: Secondary | ICD-10-CM | POA: Diagnosis not present

## 2018-08-14 DIAGNOSIS — J449 Chronic obstructive pulmonary disease, unspecified: Secondary | ICD-10-CM

## 2018-08-14 DIAGNOSIS — K56600 Partial intestinal obstruction, unspecified as to cause: Secondary | ICD-10-CM

## 2018-08-14 DIAGNOSIS — M546 Pain in thoracic spine: Secondary | ICD-10-CM

## 2018-08-14 DIAGNOSIS — R053 Chronic cough: Secondary | ICD-10-CM

## 2018-08-14 MED ORDER — TRAMADOL HCL 50 MG PO TABS: 100.0000 mg | ORAL_TABLET | Freq: Three times a day (TID) | ORAL | 3 refills | Status: DC | PRN

## 2018-08-14 MED ORDER — HYDROCODONE-HOMATROPINE 5-1.5 MG/5ML PO SYRP: 5.0000 mL | ORAL_SOLUTION | Freq: Three times a day (TID) | ORAL | 0 refills | Status: DC | PRN

## 2018-08-14 NOTE — Patient Instructions (Signed)
Bowel Obstruction  A bowel obstruction is a blockage in the small or large bowel. The bowel, which is also called the intestine, is a long, slender tube that connects the stomach to the anus. When a person eats and drinks, food and fluids go from the mouth to the stomach to the small bowel. This is where most of the nutrients in the food and fluids are absorbed. After the small bowel, material passes through the large bowel for further absorption until any leftover material leaves the body as stool through the anus during a bowel movement.  A bowel obstruction will prevent food and fluids from passing through the bowel as they normally do during digestion. The bowel can become partially or completely blocked. If this condition is not treated, it can be dangerous because the bowel could rupture.  What are the causes?  Common causes of this condition include:  · Scar tissue (adhesions) from previous surgery or treatment with high-energy X-rays (radiation).  · Recent surgery. This may cause the movements of the bowel to slow down and cause food to block the intestine.  · Inflammatory bowel disease, such asCrohn's disease or diverticulitis.  · Growths or tumors.  · A bulging organ (hernia).  · Twisting of the bowel (volvulus).  · A foreign body.  · Slipping of a part of the bowel into another part (intussusception).  What are the signs or symptoms?  Symptoms of this condition include:  · Pain in the abdomen. Depending on the degree of obstruction, pain may be:  ? Mild or severe.  ? Dull cramping or sharp pain.  ? In one area or in the entire abdomen.  · Nausea and vomiting. Vomit may be greenish or a yellow bile color.  · Bloating in the abdomen.  · Difficulty passing stool (constipation).  · Lack of passing gas.  · Frequent belching.  · Diarrhea. This may occur if the obstruction is partial and runny stool is able to leak around the obstruction.  How is this diagnosed?  This condition may be diagnosed based on:  · A  physical exam.  · Medical history.  · Imaging tests of the abdomen or pelvis, such as X-ray or CT scan.  · Blood or urine tests.  How is this treated?  Treatment for this condition depends on the cause and severity of the problem. Treatment may include:  · Fluids and pain medicines that are given through an IV. Your health care provider may instruct you not to eat or drink if you have nausea or vomiting.  · Eating a simple diet. You may be asked to consume a clear liquid diet for several days. This allows the bowel to rest.  · Placement of a small tube (nasogastric tube) into the stomach. This will relieve pain, discomfort, and nausea by removing blocked air and fluids from the stomach. It can also help the obstruction clear up faster.  · Surgery. This may be required if other treatments do not work. Surgery may be required for:  ? Bowel obstruction from a hernia. This can be an emergency procedure.  ? Scar tissue that causes frequent or severe obstructions.  Follow these instructions at home:  Medicines  · Take over-the-counter and prescription medicines only as told by your health care provider.  · If you were prescribed an antibiotic medicine, take it as told by your health care provider. Do not stop taking the antibiotic even if you start to feel better.  General instructions  ·   Follow instructions from your health care provider about eating restrictions. You may need to avoid solid foods and consume only clear liquids until your condition improves.  · Return to your normal activities as told by your health care provider. Ask your health care provider what activities are safe for you.  · Avoid sitting for a long time without moving. Get up to take short walks every 1-2 hours. This is important to improve blood flow and breathing. Ask for help if you feel weak or unsteady.  · Keep all follow-up visits as told by your health care provider. This is important.  How is this prevented?  After having a bowel  obstruction, you are more likely to have another. You may do the following things to prevent another obstruction:  · If you have a long-term (chronic) disease, pay attention to your symptoms and contact your health care provider if you have questions or concerns.  · Avoid becoming constipated. To prevent or treat constipation, your health care provider may recommend that you:  ? Drink enough fluid to keep your urine pale yellow.  ? Take over-the-counter or prescription medicines.  ? Eat foods that are high in fiber, such as beans, whole grains, and fresh fruits and vegetables.  ? Limit foods that are high in fat and processed sugars, such as fried or sweet foods.  · Stay active. Exercise for 30 minutes or more, 5 or more days each week. Ask your health care provider which exercises are safe for you.  · Avoid stress. Find ways to reduce stress, such as meditation, exercise, or taking time for activities that relax you.  · Instead of eating three large meals each day, eat three small meals with three small snacks.  · Work with a dietitian to make a healthy meal plan that works for you.  · Do not use any products that contain nicotine or tobacco, such as cigarettes and e-cigarettes. If you need help quitting, ask your health care provider.  Contact a health care provider if you:  · Have a fever.  · Have chills.  Get help right away if you:  · Have increased pain or cramping.  · Vomit blood.  · Have uncontrolled vomiting or nausea.  · Cannot drink fluids because of vomiting or pain.  · Become confused.  · Begin feeling very thirsty (dehydrated).  · Have severe bloating.  · Feel extremely weak or you faint.  Summary  · A bowel obstruction is a blockage in the small or large bowel.  · A bowel obstruction will prevent food and fluids from passing through the bowel as they normally do during digestion.  · Treatment for this condition depends on the cause and severity of the problem. It may include fluids and pain medicines  through an IV, a simple diet, a nasogastric tube, or surgery.  · Follow instructions from your health care provider about eating restrictions. You may need to avoid solid foods and consume only clear liquids until your condition improves.  This information is not intended to replace advice given to you by your health care provider. Make sure you discuss any questions you have with your health care provider.  Document Released: 09/05/2005 Document Revised: 10/31/2017 Document Reviewed: 10/31/2017  Elsevier Interactive Patient Education © 2019 [employer]

## 2018-08-14 NOTE — Progress Notes (Signed)
Subjective:    Patient ID: Stacy Dalton, female    DOB: 1951/04/17, 68 y.o.   MRN: [id number]  Chief Complaint  Patient presents with  . loose stools,hemorroids   Pt patient presents to the office today for hospital follow up. She went to the ED on 07/20/18 with a partial small bowel obstruction. She was admitted on 07/20/18 and discharge 07/24/18. She was told to have a soft diet and force fluids. She was told to avoid all NSAID's.  Abdominal Pain  This is a recurrent problem. The problem occurs intermittently. The problem has been waxing and waning. The pain is located in the generalized abdominal region. The pain is at a severity of 1/10. The pain is moderate. The quality of the pain is cramping and sharp. Associated symptoms include diarrhea and nausea. Pertinent negatives include no belching or constipation. The pain is aggravated by being still.  Back Pain  This is a chronic problem. The current episode started more than 1 year ago. The problem occurs constantly. The problem has been waxing and waning since onset. The pain is present in the lumbar spine and thoracic spine. The quality of the pain is described as aching. The pain is at a severity of 10/10. The pain is moderate. Associated symptoms include abdominal pain. She has tried analgesics and bed rest for the symptoms. The treatment provided mild relief.      Review of Systems  Gastrointestinal: Positive for abdominal pain, diarrhea and nausea. Negative for constipation.  Musculoskeletal: Positive for back pain.  All other systems reviewed and are negative.      Objective:   Physical Exam Vitals signs reviewed.  Constitutional:      General: She is not in acute distress.    Appearance: She is well-developed.  HENT:     Head: Normocephalic and atraumatic.     Right Ear: Tympanic membrane normal.     Left Ear: Tympanic membrane normal.  Eyes:     Pupils: Pupils are equal, round, and reactive to light.  Neck:   Musculoskeletal: Normal range of motion and neck supple.     Thyroid: No thyromegaly.  Cardiovascular:     Rate and Rhythm: Normal rate and regular rhythm.     Heart sounds: Normal heart sounds. No murmur.  Pulmonary:     Effort: Pulmonary effort is normal. No respiratory distress.     Breath sounds: Normal breath sounds. No wheezing.  Abdominal:     General: Bowel sounds are normal. There is no distension.     Palpations: Abdomen is soft.     Tenderness: There is abdominal tenderness (generalized).  Musculoskeletal:        General: No tenderness.     Comments: Pain in thoracic back with flexion  Skin:    General: Skin is warm and dry.  Neurological:     Mental Status: She is alert and oriented to person, place, and time.     Cranial Nerves: No cranial nerve deficit.     Deep Tendon Reflexes: Reflexes are normal and symmetric.  Psychiatric:        Behavior: Behavior normal.        Thought Content: Thought content normal.        Judgment: Judgment normal.     BP 138/83   Pulse (!) 102   Temp 98 F (36.7 C) (Oral)   Ht _0  (1.549 m)   Wt 103 lb 3.2 oz (46.8 kg)   BMI 19.50 kg/m  Assessment & Plan:  Stacy Dalton comes in today with chief complaint of loose stools,hemorroids   Diagnosis and orders addressed:  1. S/P colostomy ([place]) - CMP14+EGFR - CBC with Differential/Platelet - Ambulatory referral to Gastroenterology  2. Other chronic pain - CMP14+EGFR - CBC with Differential/Platelet - Ambulatory referral to Gastroenterology  3. Pain medication agreement signed - CMP14+EGFR - CBC with Differential/Platelet - traMADol (ULTRAM) 50 MG tablet; Take 2 tablets (100 mg total) by mouth every 8 (eight) hours as needed (pain).  Dispense: 180 tablet; Refill: 3  4. Uncomplicated opioid dependence (HCC) - CMP14+EGFR - CBC with Differential/Platelet - traMADol (ULTRAM) 50 MG tablet; Take 2 tablets (100 mg total) by mouth every 8 (eight) hours as needed (pain).   Dispense: 180 tablet; Refill: 3  5. Partial small bowel obstruction (HCC) - CMP14+EGFR - CBC with Differential/Platelet - Ambulatory referral to Gastroenterology  6. Chronic obstructive pulmonary disease, unspecified COPD type ([place]) - CMP14+EGFR - CBC with Differential/Platelet - HYDROcodone-homatropine (HYCODAN) 5-1.5 MG/5ML syrup; Take 5 mLs by mouth every 8 (eight) hours as needed for cough.  Dispense: 240 mL; Refill: 0  7. Chronic cough - CMP14+EGFR - CBC with Differential/Platelet - HYDROcodone-homatropine (HYCODAN) 5-1.5 MG/5ML syrup; Take 5 mLs by mouth every 8 (eight) hours as needed for cough.  Dispense: 240 mL; Refill: 0  8. Chronic bilateral thoracic back pain - CMP14+EGFR - CBC with Differential/Platelet - traMADol (ULTRAM) 50 MG tablet; Take 2 tablets (100 mg total) by mouth every 8 (eight) hours as needed (pain).  Dispense: 180 tablet; Refill: 3  9. Chronic pain syndrome - CMP14+EGFR - CBC with Differential/Platelet - traMADol (ULTRAM) 50 MG tablet; Take 2 tablets (100 mg total) by mouth every 8 (eight) hours as needed (pain).  Dispense: 180 tablet; Refill: 3   Labs pending PT reviewed in [place] controlled database- No red flags noted Health Maintenance reviewed Diet and exercise encouraged  Follow up plan: 3 months    Evelina Dun, FNP

## 2018-08-15 LAB — CMP14+EGFR
ALT: 10 IU/L (ref 0–32)
AST: 19 IU/L (ref 0–40)
Albumin/Globulin Ratio: 2 (ref 1.2–2.2)
Albumin: 4.5 g/dL (ref 3.8–4.8)
Alkaline Phosphatase: 105 IU/L (ref 39–117)
BUN/Creatinine Ratio: 17 (ref 12–28)
BUN: 15 mg/dL (ref 8–27)
Bilirubin Total: <0.2 mg/dL (ref 0.0–1.2)
CO2: 21 mmol/L (ref 20–29)
Calcium: 9.8 mg/dL (ref 8.7–10.3)
Chloride: 102 mmol/L (ref 96–106)
Creatinine, Ser: 0.9 mg/dL (ref 0.57–1.00)
GFR calc Af Amer: 75 mL/min/{1.73_m2} (ref >59)
GFR calc non Af Amer: 65 mL/min/{1.73_m2} (ref >59)
Globulin, Total: 2.3 g/dL (ref 1.5–4.5)
Glucose: 86 mg/dL (ref 65–99)
Potassium: 3.7 mmol/L (ref 3.5–5.2)
Sodium: 141 mmol/L (ref 134–144)
Total Protein: 6.8 g/dL (ref 6.0–8.5)

## 2018-08-15 LAB — CBC WITH DIFFERENTIAL/PLATELET
Basophils Absolute: 0 10*3/uL (ref 0.0–0.2)
Basos: 1 %
EOS (ABSOLUTE): 0.2 10*3/uL (ref 0.0–0.4)
Eos: 3 %
Hematocrit: 42.3 % (ref 34.0–46.6)
Hemoglobin: 13.8 g/dL (ref 11.1–15.9)
Immature Grans (Abs): 0.1 10*3/uL (ref 0.0–0.1)
Immature Granulocytes: 1 %
Lymphocytes Absolute: 2.1 10*3/uL (ref 0.7–3.1)
Lymphs: 27 %
MCH: 27.7 pg (ref 26.6–33.0)
MCHC: 32.6 g/dL (ref 31.5–35.7)
MCV: 85 fL (ref 79–97)
Monocytes Absolute: 0.7 10*3/uL (ref 0.1–0.9)
Monocytes: 10 %
Neutrophils Absolute: 4.5 10*3/uL (ref 1.4–7.0)
Neutrophils: 58 %
Platelets: 358 10*3/uL (ref 150–450)
RBC: 4.99 x10E6/uL (ref 3.77–5.28)
RDW: 13.4 % (ref 11.7–15.4)
WBC: 7.5 10*3/uL (ref 3.4–10.8)

## 2018-08-19 ENCOUNTER — Telehealth: Payer: Self-pay | Admitting: *Deleted

## 2018-08-19 NOTE — Telephone Encounter (Signed)
Left message for patient to call back with the name of last GI doctor

## 2018-08-20 ENCOUNTER — Ambulatory Visit: Payer: Medicare Other | Admitting: Family

## 2018-08-21 ENCOUNTER — Telehealth: Payer: Self-pay | Admitting: Family

## 2018-08-21 MED ORDER — ONDANSETRON HCL 4 MG PO TABS: 4.0000 mg | ORAL_TABLET | Freq: Three times a day (TID) | ORAL | 0 refills | Status: DC | PRN

## 2018-08-21 NOTE — Telephone Encounter (Signed)
Yes go ahead and refill the Zofran for 1 month, 4 mg ODT every 8 hours as needed #60

## 2018-08-21 NOTE — Addendum Note (Signed)
Addended by: Shelbie Ammons on: 08/21/2018 10:22 AM   Modules accepted: Orders

## 2018-08-21 NOTE — Telephone Encounter (Signed)
Script sent in and aware of last lab results.

## 2018-08-21 NOTE — Telephone Encounter (Signed)
What is the name of the medication? Zofran  Have you contacted your pharmacy to request a refill? yes  Which pharmacy would you like this sent to? [facility] [address] in [place]    Patient notified that their request is being sent to the clinical staff for review and that they should receive a call once it is complete. If they do not receive a call within 24 hours they can check with their pharmacy or our office.

## 2018-09-05 ENCOUNTER — Telehealth: Payer: Self-pay | Admitting: Family

## 2018-09-05 DIAGNOSIS — J449 Chronic obstructive pulmonary disease, unspecified: Secondary | ICD-10-CM

## 2018-09-05 DIAGNOSIS — R05 Cough: Secondary | ICD-10-CM

## 2018-09-05 DIAGNOSIS — R053 Chronic cough: Secondary | ICD-10-CM

## 2018-09-05 MED ORDER — HYDROCODONE-HOMATROPINE 5-1.5 MG/5ML PO SYRP: 5.0000 mL | ORAL_SOLUTION | Freq: Three times a day (TID) | ORAL | 0 refills | Status: DC | PRN

## 2018-09-05 NOTE — Telephone Encounter (Signed)
Prescription sent to pharmacy.

## 2018-09-05 NOTE — Telephone Encounter (Signed)
Left message, cough medication sent in.

## 2018-09-05 NOTE — Telephone Encounter (Signed)
What is the name of the medication? Hycodan 5-1.5 mg  Have you contacted your pharmacy to request a refill? Yes  Which pharmacy would you like this sent to? [facility] in [place]   Patient notified that their request is being sent to the clinical staff for review and that they should receive a call once it is complete. If they do not receive a call within 24 hours they can check with their pharmacy or our office.

## 2018-09-17 ENCOUNTER — Telehealth: Payer: Self-pay | Admitting: Family

## 2018-09-17 MED ORDER — ONDANSETRON HCL 4 MG PO TABS: 4.0000 mg | ORAL_TABLET | Freq: Three times a day (TID) | ORAL | 3 refills | Status: DC | PRN

## 2018-09-17 NOTE — Telephone Encounter (Signed)
Prescription sent to pharmacy.

## 2018-09-19 ENCOUNTER — Other Ambulatory Visit: Payer: Self-pay

## 2018-09-19 ENCOUNTER — Emergency Department (HOSPITAL_COMMUNITY): Payer: Medicare Other

## 2018-09-19 ENCOUNTER — Inpatient Hospital Stay (HOSPITAL_COMMUNITY)
Admission: EM | Admit: 2018-09-19 | Discharge: 2018-09-24 | DRG: 871 | Disposition: A | Discharge status: Home / Self Care | Payer: Medicare Other | Attending: Internal Medicine | Admitting: Internal Medicine

## 2018-09-19 ENCOUNTER — Ambulatory Visit: Payer: Medicare Other | Admitting: Family

## 2018-09-19 ENCOUNTER — Encounter (HOSPITAL_COMMUNITY): Payer: Self-pay | Admitting: Emergency Medicine

## 2018-09-19 DIAGNOSIS — Z87891 Personal history of nicotine dependence: Secondary | ICD-10-CM

## 2018-09-19 DIAGNOSIS — E876 Hypokalemia: Secondary | ICD-10-CM | POA: Diagnosis present

## 2018-09-19 DIAGNOSIS — M8580 Other specified disorders of bone density and structure, unspecified site: Secondary | ICD-10-CM | POA: Diagnosis present

## 2018-09-19 DIAGNOSIS — Z886 Allergy status to analgesic agent status: Secondary | ICD-10-CM

## 2018-09-19 DIAGNOSIS — Z90710 Acquired absence of both cervix and uterus: Secondary | ICD-10-CM

## 2018-09-19 DIAGNOSIS — J9601 Acute respiratory failure with hypoxia: Secondary | ICD-10-CM | POA: Diagnosis not present

## 2018-09-19 DIAGNOSIS — A419 Sepsis, unspecified organism: Secondary | ICD-10-CM | POA: Diagnosis not present

## 2018-09-19 DIAGNOSIS — E860 Dehydration: Secondary | ICD-10-CM | POA: Diagnosis not present

## 2018-09-19 DIAGNOSIS — Z91041 Radiographic dye allergy status: Secondary | ICD-10-CM

## 2018-09-19 DIAGNOSIS — G4700 Insomnia, unspecified: Secondary | ICD-10-CM | POA: Diagnosis present

## 2018-09-19 DIAGNOSIS — F329 Major depressive disorder, single episode, unspecified: Secondary | ICD-10-CM | POA: Diagnosis present

## 2018-09-19 DIAGNOSIS — F321 Major depressive disorder, single episode, moderate: Secondary | ICD-10-CM

## 2018-09-19 DIAGNOSIS — Z79899 Other long term (current) drug therapy: Secondary | ICD-10-CM

## 2018-09-19 DIAGNOSIS — I10 Essential (primary) hypertension: Secondary | ICD-10-CM | POA: Diagnosis present

## 2018-09-19 DIAGNOSIS — J440 Chronic obstructive pulmonary disease with acute lower respiratory infection: Secondary | ICD-10-CM | POA: Diagnosis present

## 2018-09-19 DIAGNOSIS — Z825 Family history of asthma and other chronic lower respiratory diseases: Secondary | ICD-10-CM | POA: Diagnosis not present

## 2018-09-19 DIAGNOSIS — Z885 Allergy status to narcotic agent status: Secondary | ICD-10-CM

## 2018-09-19 DIAGNOSIS — G43909 Migraine, unspecified, not intractable, without status migrainosus: Secondary | ICD-10-CM | POA: Diagnosis present

## 2018-09-19 DIAGNOSIS — Z8349 Family history of other endocrine, nutritional and metabolic diseases: Secondary | ICD-10-CM

## 2018-09-19 DIAGNOSIS — R0600 Dyspnea, unspecified: Secondary | ICD-10-CM | POA: Diagnosis not present

## 2018-09-19 DIAGNOSIS — J129 Viral pneumonia, unspecified: Secondary | ICD-10-CM | POA: Diagnosis present

## 2018-09-19 DIAGNOSIS — J13 Pneumonia due to Streptococcus pneumoniae: Secondary | ICD-10-CM | POA: Diagnosis present

## 2018-09-19 DIAGNOSIS — Z79890 Hormone replacement therapy (postmenopausal): Secondary | ICD-10-CM

## 2018-09-19 DIAGNOSIS — Z7982 Long term (current) use of aspirin: Secondary | ICD-10-CM

## 2018-09-19 DIAGNOSIS — K219 Gastro-esophageal reflux disease without esophagitis: Secondary | ICD-10-CM | POA: Diagnosis not present

## 2018-09-19 DIAGNOSIS — F410 Panic disorder [episodic paroxysmal anxiety] without agoraphobia: Secondary | ICD-10-CM | POA: Diagnosis present

## 2018-09-19 DIAGNOSIS — R740 Nonspecific elevation of levels of transaminase and lactic acid dehydrogenase [LDH]: Secondary | ICD-10-CM | POA: Diagnosis present

## 2018-09-19 DIAGNOSIS — Z209 Contact with and (suspected) exposure to unspecified communicable disease: Secondary | ICD-10-CM | POA: Diagnosis not present

## 2018-09-19 DIAGNOSIS — F32A Depression, unspecified: Secondary | ICD-10-CM | POA: Diagnosis present

## 2018-09-19 DIAGNOSIS — G894 Chronic pain syndrome: Secondary | ICD-10-CM | POA: Diagnosis present

## 2018-09-19 DIAGNOSIS — G8929 Other chronic pain: Secondary | ICD-10-CM | POA: Diagnosis not present

## 2018-09-19 DIAGNOSIS — D72810 Lymphocytopenia: Secondary | ICD-10-CM | POA: Diagnosis present

## 2018-09-19 DIAGNOSIS — R52 Pain, unspecified: Secondary | ICD-10-CM | POA: Diagnosis not present

## 2018-09-19 DIAGNOSIS — Z20828 Contact with and (suspected) exposure to other viral communicable diseases: Secondary | ICD-10-CM | POA: Diagnosis not present

## 2018-09-19 DIAGNOSIS — J441 Chronic obstructive pulmonary disease with (acute) exacerbation: Secondary | ICD-10-CM | POA: Diagnosis not present

## 2018-09-19 DIAGNOSIS — R7982 Elevated C-reactive protein (CRP): Secondary | ICD-10-CM | POA: Diagnosis present

## 2018-09-19 DIAGNOSIS — J9811 Atelectasis: Secondary | ICD-10-CM | POA: Diagnosis not present

## 2018-09-19 DIAGNOSIS — Z8249 Family history of ischemic heart disease and other diseases of the circulatory system: Secondary | ICD-10-CM

## 2018-09-19 DIAGNOSIS — F419 Anxiety disorder, unspecified: Secondary | ICD-10-CM | POA: Diagnosis present

## 2018-09-19 DIAGNOSIS — J189 Pneumonia, unspecified organism: Secondary | ICD-10-CM | POA: Diagnosis not present

## 2018-09-19 DIAGNOSIS — R509 Fever, unspecified: Secondary | ICD-10-CM | POA: Diagnosis not present

## 2018-09-19 DIAGNOSIS — R51 Headache: Secondary | ICD-10-CM | POA: Diagnosis not present

## 2018-09-19 DIAGNOSIS — E039 Hypothyroidism, unspecified: Secondary | ICD-10-CM | POA: Diagnosis not present

## 2018-09-19 DIAGNOSIS — E785 Hyperlipidemia, unspecified: Secondary | ICD-10-CM | POA: Diagnosis present

## 2018-09-19 DIAGNOSIS — R404 Transient alteration of awareness: Secondary | ICD-10-CM | POA: Diagnosis not present

## 2018-09-19 DIAGNOSIS — R0602 Shortness of breath: Secondary | ICD-10-CM | POA: Diagnosis not present

## 2018-09-19 DIAGNOSIS — Z7951 Long term (current) use of inhaled steroids: Secondary | ICD-10-CM

## 2018-09-19 DIAGNOSIS — J449 Chronic obstructive pulmonary disease, unspecified: Secondary | ICD-10-CM | POA: Diagnosis not present

## 2018-09-19 LAB — C-REACTIVE PROTEIN: CRP: 5.1 mg/dL — ABNORMAL HIGH (ref <1.0)

## 2018-09-19 LAB — CBC WITH DIFFERENTIAL/PLATELET
Abs Immature Granulocytes: 0.03 10*3/uL (ref 0.00–0.07)
Basophils Absolute: 0 10*3/uL (ref 0.0–0.1)
Basophils Relative: 0 %
Eosinophils Absolute: 0.1 10*3/uL (ref 0.0–0.5)
Eosinophils Relative: 1 %
HCT: 32.1 % — ABNORMAL LOW (ref 36.0–46.0)
Hemoglobin: 10.3 g/dL — ABNORMAL LOW (ref 12.0–15.0)
Immature Granulocytes: 0 %
Lymphocytes Relative: 2 %
Lymphs Abs: 0.1 10*3/uL — ABNORMAL LOW (ref 0.7–4.0)
MCH: 28.3 pg (ref 26.0–34.0)
MCHC: 32.1 g/dL (ref 30.0–36.0)
MCV: 88.2 fL (ref 80.0–100.0)
Monocytes Absolute: 0.2 10*3/uL (ref 0.1–1.0)
Monocytes Relative: 3 %
Neutro Abs: 7.2 10*3/uL (ref 1.7–7.7)
Neutrophils Relative %: 94 %
Platelets: 235 10*3/uL (ref 150–400)
RBC: 3.64 MIL/uL — ABNORMAL LOW (ref 3.87–5.11)
RDW: 14 % (ref 11.5–15.5)
WBC: 7.6 10*3/uL (ref 4.0–10.5)
nRBC: 0 % (ref 0.0–0.2)

## 2018-09-19 LAB — URINALYSIS, ROUTINE W REFLEX MICROSCOPIC
Bilirubin Urine: NEGATIVE
Glucose, UA: 50 mg/dL — AB
Hgb urine dipstick: NEGATIVE
Ketones, ur: NEGATIVE mg/dL
Leukocytes,Ua: NEGATIVE
Nitrite: NEGATIVE
Protein, ur: NEGATIVE mg/dL
Specific Gravity, Urine: 1.014 (ref 1.005–1.030)
pH: 5 (ref 5.0–8.0)

## 2018-09-19 LAB — COMPREHENSIVE METABOLIC PANEL
ALT: 14 U/L (ref 0–44)
AST: 29 U/L (ref 15–41)
Albumin: 3.2 g/dL — ABNORMAL LOW (ref 3.5–5.0)
Alkaline Phosphatase: 70 U/L (ref 38–126)
Anion gap: 9 (ref 5–15)
BUN: 20 mg/dL (ref 8–23)
CO2: 25 mmol/L (ref 22–32)
Calcium: 8.1 mg/dL — ABNORMAL LOW (ref 8.9–10.3)
Chloride: 102 mmol/L (ref 98–111)
Creatinine, Ser: 0.9 mg/dL (ref 0.44–1.00)
GFR calc Af Amer: >60 mL/min (ref >60)
GFR calc non Af Amer: >60 mL/min (ref >60)
Glucose, Bld: 136 mg/dL — ABNORMAL HIGH (ref 70–99)
Potassium: 3.2 mmol/L — ABNORMAL LOW (ref 3.5–5.1)
Sodium: 136 mmol/L (ref 135–145)
Total Bilirubin: 0.3 mg/dL (ref 0.3–1.2)
Total Protein: 5.8 g/dL — ABNORMAL LOW (ref 6.5–8.1)

## 2018-09-19 LAB — PHOSPHORUS: Phosphorus: 2.8 mg/dL (ref 2.5–4.6)

## 2018-09-19 LAB — RESPIRATORY PANEL BY PCR
Adenovirus: NOT DETECTED
Bordetella pertussis: NOT DETECTED
Chlamydophila pneumoniae: NOT DETECTED
Coronavirus 229E: NOT DETECTED
Coronavirus HKU1: NOT DETECTED
Coronavirus NL63: DETECTED — AB
Coronavirus OC43: NOT DETECTED
Influenza A: NOT DETECTED
Influenza B: NOT DETECTED
Metapneumovirus: NOT DETECTED
Mycoplasma pneumoniae: NOT DETECTED
Parainfluenza Virus 1: NOT DETECTED
Parainfluenza Virus 2: NOT DETECTED
Parainfluenza Virus 3: NOT DETECTED
Parainfluenza Virus 4: NOT DETECTED
Respiratory Syncytial Virus: NOT DETECTED
Rhinovirus / Enterovirus: NOT DETECTED

## 2018-09-19 LAB — PROTIME-INR
INR: 0.9 (ref 0.8–1.2)
Prothrombin Time: 12.1 seconds (ref 11.4–15.2)

## 2018-09-19 LAB — MRSA PCR SCREENING: MRSA by PCR: NEGATIVE

## 2018-09-19 LAB — INFLUENZA PANEL BY PCR (TYPE A & B)
Influenza A By PCR: NEGATIVE
Influenza B By PCR: NEGATIVE

## 2018-09-19 LAB — FERRITIN: Ferritin: 26 ng/mL (ref 11–307)

## 2018-09-19 LAB — TSH: TSH: 1.372 u[IU]/mL (ref 0.350–4.500)

## 2018-09-19 LAB — LACTIC ACID, PLASMA
Lactic Acid, Venous: 1.7 mmol/L (ref 0.5–1.9)
Lactic Acid, Venous: 1.3 mmol/L (ref 0.5–1.9)

## 2018-09-19 LAB — MAGNESIUM: Magnesium: 1.6 mg/dL — ABNORMAL LOW (ref 1.7–2.4)

## 2018-09-19 LAB — LACTATE DEHYDROGENASE: LDH: 120 U/L (ref 98–192)

## 2018-09-19 LAB — PROCALCITONIN: Procalcitonin: 4.9 ng/mL

## 2018-09-19 MED ORDER — ACETAMINOPHEN 325 MG PO TABS
ORAL_TABLET | ORAL | Status: AC
  Filled 2018-09-19: qty 2

## 2018-09-19 MED ORDER — CHLORHEXIDINE GLUCONATE CLOTH 2 % EX PADS
6.0000 | MEDICATED_PAD | Freq: Every day | CUTANEOUS | Status: DC
  Administered 2018-09-20 – 2018-09-23 (×4): 6 via TOPICAL

## 2018-09-19 MED ORDER — PIPERACILLIN-TAZOBACTAM 3.375 G IVPB 30 MIN
3.3750 g | Freq: Once | INTRAVENOUS | Status: AC
  Administered 2018-09-19: 3.375 g via INTRAVENOUS

## 2018-09-19 MED ORDER — SODIUM CHLORIDE 0.9 % IV SOLN
INTRAVENOUS | Status: DC
  Administered 2018-09-19 – 2018-09-20 (×2): via INTRAVENOUS

## 2018-09-19 MED ORDER — PANTOPRAZOLE SODIUM 40 MG PO TBEC
40.0000 mg | DELAYED_RELEASE_TABLET | Freq: Every day | ORAL | Status: DC
  Administered 2018-09-19 – 2018-09-24 (×6): 40 mg via ORAL
  Filled 2018-09-19 (×6): qty 1

## 2018-09-19 MED ORDER — IPRATROPIUM-ALBUTEROL 0.5-2.5 (3) MG/3ML IN SOLN
RESPIRATORY_TRACT | Status: AC
  Filled 2018-09-19: qty 3

## 2018-09-19 MED ORDER — LORATADINE 10 MG PO TABS
10.0000 mg | ORAL_TABLET | Freq: Every day | ORAL | Status: DC
  Administered 2018-09-19 – 2018-09-24 (×6): 10 mg via ORAL
  Filled 2018-09-19 (×6): qty 1

## 2018-09-19 MED ORDER — MOMETASONE FURO-FORMOTEROL FUM 200-5 MCG/ACT IN AERO
2.0000 | INHALATION_SPRAY | Freq: Two times a day (BID) | RESPIRATORY_TRACT | Status: DC
  Administered 2018-09-20 – 2018-09-24 (×9): 2 via RESPIRATORY_TRACT
  Filled 2018-09-19 (×2): qty 8.8

## 2018-09-19 MED ORDER — SODIUM CHLORIDE 0.9 % IV SOLN
500.0000 mg | INTRAVENOUS | Status: AC
  Administered 2018-09-19 – 2018-09-23 (×5): 500 mg via INTRAVENOUS
  Filled 2018-09-19 (×5): qty 500

## 2018-09-19 MED ORDER — HEPARIN SODIUM (PORCINE) 5000 UNIT/ML IJ SOLN
5000.0000 [IU] | Freq: Three times a day (TID) | INTRAMUSCULAR | Status: DC
  Administered 2018-09-19 – 2018-09-24 (×15): 5000 [IU] via SUBCUTANEOUS
  Filled 2018-09-19 (×15): qty 1

## 2018-09-19 MED ORDER — ONDANSETRON HCL 4 MG/2ML IJ SOLN
4.0000 mg | Freq: Four times a day (QID) | INTRAMUSCULAR | Status: DC | PRN
  Filled 2018-09-19: qty 2

## 2018-09-19 MED ORDER — ALBUTEROL SULFATE HFA 108 (90 BASE) MCG/ACT IN AERS
2.0000 | INHALATION_SPRAY | Freq: Four times a day (QID) | RESPIRATORY_TRACT | Status: DC | PRN
  Filled 2018-09-19: qty 6.7

## 2018-09-19 MED ORDER — TEMAZEPAM 15 MG PO CAPS
30.0000 mg | ORAL_CAPSULE | Freq: Every evening | ORAL | Status: DC | PRN
  Administered 2018-09-20 – 2018-09-24 (×4): 30 mg via ORAL
  Filled 2018-09-19 (×4): qty 2

## 2018-09-19 MED ORDER — ACETAMINOPHEN 325 MG PO TABS
650.0000 mg | ORAL_TABLET | Freq: Once | ORAL | Status: AC
  Administered 2018-09-19: 650 mg via ORAL

## 2018-09-19 MED ORDER — ACETAMINOPHEN 325 MG PO TABS
650.0000 mg | ORAL_TABLET | Freq: Four times a day (QID) | ORAL | Status: DC | PRN
  Administered 2018-09-20 – 2018-09-23 (×7): 650 mg via ORAL
  Filled 2018-09-19 (×8): qty 2

## 2018-09-19 MED ORDER — IPRATROPIUM-ALBUTEROL 0.5-2.5 (3) MG/3ML IN SOLN
3.0000 mL | Freq: Once | RESPIRATORY_TRACT | Status: AC
  Administered 2018-09-19: 3 mL via RESPIRATORY_TRACT

## 2018-09-19 MED ORDER — LEVOTHYROXINE SODIUM 25 MCG PO TABS
25.0000 ug | ORAL_TABLET | Freq: Every day | ORAL | Status: DC
  Administered 2018-09-20 – 2018-09-24 (×5): 25 ug via ORAL
  Filled 2018-09-19 (×6): qty 1

## 2018-09-19 MED ORDER — POTASSIUM CHLORIDE CRYS ER 20 MEQ PO TBCR
40.0000 meq | EXTENDED_RELEASE_TABLET | Freq: Once | ORAL | Status: AC
  Administered 2018-09-19: 40 meq via ORAL
  Filled 2018-09-19: qty 2

## 2018-09-19 MED ORDER — SODIUM CHLORIDE 0.9 % IV BOLUS
1500.0000 mL | Freq: Once | INTRAVENOUS | Status: AC
  Administered 2018-09-19: 1500 mL via INTRAVENOUS

## 2018-09-19 MED ORDER — DULOXETINE HCL 60 MG PO CPEP
60.0000 mg | ORAL_CAPSULE | Freq: Every day | ORAL | Status: DC
  Administered 2018-09-19 – 2018-09-24 (×6): 60 mg via ORAL
  Filled 2018-09-19 (×5): qty 2
  Filled 2018-09-19: qty 1

## 2018-09-19 MED ORDER — TRAMADOL HCL 50 MG PO TABS
100.0000 mg | ORAL_TABLET | Freq: Three times a day (TID) | ORAL | Status: DC | PRN
  Administered 2018-09-19: 100 mg via ORAL
  Filled 2018-09-19: qty 2

## 2018-09-19 MED ORDER — ATORVASTATIN CALCIUM 20 MG PO TABS
20.0000 mg | ORAL_TABLET | Freq: Every day | ORAL | Status: DC
  Administered 2018-09-19 – 2018-09-24 (×6): 20 mg via ORAL
  Filled 2018-09-19 (×5): qty 2
  Filled 2018-09-19: qty 1
  Filled 2018-09-19: qty 2

## 2018-09-19 MED ORDER — IPRATROPIUM BROMIDE HFA 17 MCG/ACT IN AERS
2.0000 | INHALATION_SPRAY | Freq: Four times a day (QID) | RESPIRATORY_TRACT | Status: DC
  Filled 2018-09-19: qty 12.9

## 2018-09-19 MED ORDER — SODIUM CHLORIDE 0.9 % IV SOLN
1.0000 g | INTRAVENOUS | Status: DC
  Administered 2018-09-19 – 2018-09-24 (×5): 1 g via INTRAVENOUS
  Filled 2018-09-19 (×2): qty 1
  Filled 2018-09-19: qty 10
  Filled 2018-09-19 (×3): qty 1
  Filled 2018-09-19: qty 10

## 2018-09-19 MED ORDER — VANCOMYCIN HCL IN DEXTROSE 1-5 GM/200ML-% IV SOLN
1000.0000 mg | Freq: Once | INTRAVENOUS | Status: AC
  Administered 2018-09-19: 1000 mg via INTRAVENOUS
  Filled 2018-09-19: qty 200

## 2018-09-19 MED ORDER — UMECLIDINIUM BROMIDE 62.5 MCG/INH IN AEPB
1.0000 | INHALATION_SPRAY | Freq: Every day | RESPIRATORY_TRACT | Status: DC
  Administered 2018-09-20 – 2018-09-24 (×5): 1 via RESPIRATORY_TRACT
  Filled 2018-09-19 (×2): qty 7

## 2018-09-19 MED ORDER — FLUTICASONE PROPIONATE 50 MCG/ACT NA SUSP
1.0000 | Freq: Every day | NASAL | Status: DC
  Administered 2018-09-20: 1 via NASAL
  Filled 2018-09-19 (×2): qty 16

## 2018-09-19 MED ORDER — SODIUM CHLORIDE 0.9% FLUSH
3.0000 mL | Freq: Once | INTRAVENOUS | Status: AC
  Administered 2018-09-19: 3 mL via INTRAVENOUS

## 2018-09-19 NOTE — ED Notes (Signed)
Patient on 8 L high salter air flow. O2 is now at 100%

## 2018-09-19 NOTE — ED Notes (Signed)
Spoke with Marcello Moores, son, and Amy, daughter, and gave them updates on patient per pt request.

## 2018-09-19 NOTE — Progress Notes (Signed)
RN admitted pt to [facility]. RN asked pt if she wanted to create a password and she said yes, and wanted the password to be "Sady". Pt said both of her sons, [name] and [name], could have information. Pt stated she didn't want daughter to feel left out but would rather relay the information through either of her sons.

## 2018-09-19 NOTE — ED Triage Notes (Signed)
Pt c/o cough/fever x 3 days. Started vomoting this am. zofran 4 given en route with 541ml ns. Pt came in moaning/pale/rattling in chest. Alert/oriented to some

## 2018-09-19 NOTE — ED Notes (Signed)
Patient's daughter called for update on pt. Told daughter that patient has been transferred to [facility] and that patient designated Marcello Moores, son, to receive updates and that she could get info from [place] per patient request.

## 2018-09-19 NOTE — Progress Notes (Signed)
Patient on [facility] with I arrived in room. 02 saturations 89%. Placed patient on salter high flow nasal cannula at 8L. Patient's 02 saturations increased to 93%. Patient has a slight expiratory wheezing with fine crackles. Breathing treatment given. RT will continue to monitor.

## 2018-09-19 NOTE — ED Notes (Addendum)
Patient gave verbal permission to inform daughter, Vanita Ingles, of any updates in plan of care for patient ONLY if the daughter calls and requests. Patient expressed she DOES NOT want daughter added to contact list. Patient states she doesn't want to speak with daughter or have daughter visit. If possible, patient wants nurse's to relay information to [place], son, and have him update daughter.

## 2018-09-19 NOTE — ED Notes (Signed)
Called respiratory to evaluate patient's respiratory status.

## 2018-09-19 NOTE — H&P (Signed)
History and Physical    Stacy Dalton OVA:[id number] DOB: Jun 26, 1951 DOA: 09/19/2018  Referring MD/NP/PA: Dr. Roderic Palau  PCP: [name], FNP  Patient coming from: Home  Chief Complaint: Shortness of breath, cough, fever and general malaise.  HPI: Stacy Dalton is a 68 y.o. female with a past medical history significant for COPD, chronic pain, depression and/anxiety, gastroesophageal reflux disease, hypertension, hypothyroidism and and chronic pain syndrome; who presented to the emergency department secondary to worsening shortness of breath, cough and fever.  Symptoms have been present for the last 4 days prior to admission and worsening.  Patient reported associated headaches and general malaise.  There has been runny nose and decreased appetite/oral intake.  Patient denies chest pain, nausea, vomiting, diarrhea, abdominal pain, dysuria, hemoptysis, hematemesis, melena, hematochezia, focal weakness or any other complaints. Patient reports been in contact with her grandkids and caring for them since the schools are closed; they have had URI symptoms.  No other sick contacts that she is aware, no recent travels.  In the ED patient was found with increased requirement for oxygen supplementation (O2 sat on room air in the low 80s), increased respiratory rate, tachycardic, febrile up to 103 and with lymphopenia on her CBC with differential.  Chest x-ray demonstrated multilobar pneumonia.  Fluid resuscitation given as per sepsis protocol, started on IV antibiotics; influenza by PCR negative, RVP and COVID-19 swabs has been taken.  Past Medical/Surgical History: Past Medical History:  Diagnosis Date  . Chronic pain   . COPD (chronic obstructive pulmonary disease) ([place])    told has copd, no current inhaler use  . Cough   . Depression   . GERD (gastroesophageal reflux disease)   . Hypertension   . Hypothyroidism   . Insomnia   . Migraine   . Migraine   . Osteopenia   . Pain  management   . Panic attacks   . Small bowel obstruction [facility])     Past Surgical History:  Procedure Laterality Date  . ABDOMINAL HYSTERECTOMY    . COLON SURGERY    . COLOSTOMY CLOSURE  04/19/2012   Procedure: COLOSTOMY CLOSURE;  Surgeon: Adin Hector, MD;  Location: [facility];  Service: General;  Laterality: N/A;  Laparotomy, Resection and Closure of Colostomy  . COLOSTOMY TAKEDOWN N/A 04/12/2016   Procedure: [place] TAKEDOWN;  Surgeon: Johnathan Hausen, MD;  Location: [facility];  Service: General;  Laterality: N/A;  . INCONTINENCE SURGERY    . LAPAROTOMY  10/04/2011, colostomy also   Procedure: EXPLORATORY LAPAROTOMY;  Surgeon: Adin Hector, MD;  Location: [facility];  Service: General;  Laterality: N/A;  left partial colectomy with colostomy  . LAPAROTOMY  04/19/2012   Procedure: EXPLORATORY LAPAROTOMY;  Surgeon: Adin Hector, MD;  Location: [facility];  Service: General;  Laterality: N/A;  . LAPAROTOMY N/A 11/29/2015   Procedure: EXPLORATORY LAPAROTOMY; SUBTOTAL COLECTOMY WITH HARTMAN PROCEDURE AND END COLOSTOMY;  Surgeon: Johnathan Hausen, MD;  Location: [facility];  Service: General;  Laterality: N/A;  . TUBAL LIGATION    . VENTRAL HERNIA REPAIR  04/19/2012   Procedure: HERNIA REPAIR VENTRAL ADULT;  Surgeon: Adin Hector, MD;  Location: [facility];  Service: General;  Laterality: N/A;    Social History:  reports that she quit smoking about 19 years ago. Her smoking use included cigarettes. She has a 30.00 pack-year smoking history. She has never used smokeless tobacco. She reports that she does not drink alcohol or use drugs.  Allergies: Allergies  Allergen Reactions  .  Toradol [Ketorolac Tromethamine] Shortness Of Breath  . Butalbital-Apap-Caffeine Other (See Comments)    jittery  . Demerol Swelling  . Esgic [Butalbital-Apap-Caffeine] Other (See Comments)    jittery  . Iodine Other (See Comments)    bp bottomed out per pt several hours later; unsure if pre medicated in past with cm;  done in [place]    Family History:  Family History  Problem Relation Age of Onset  . Heart disease Mother   . Hypertension Mother   . Cancer Sister        sinus  . Diabetes Brother   . Heart disease Brother   . Thyroid disease Brother   . Cancer Sister        abdominal ?  . Thyroid disease Sister   . Cancer Other        GE junction adenocarcinoma  . Emphysema Father   . Stroke Father   . Hypertension Father   . Heart disease Father   . COPD Sister   . Thyroid disease Sister   . Thyroid disease Sister   . Diabetes Brother   . Thyroid disease Brother   . Thyroid disease Brother   . Hypertension Brother   . Post-traumatic stress disorder Brother   . COPD Brother   . Thyroid disease Brother   . Thyroid disease Brother   . Hypertension Brother   . Transient ischemic attack Brother     Prior to Admission medications   Medication Sig Start Date End Date Taking? Authorizing Provider  albuterol (VENTOLIN HFA) 108 (90 Base) MCG/ACT inhaler Inhale 2 puffs into the lungs every 6 (six) hours as needed for wheezing or shortness of breath.    Yes [provider]  amLODipine-olmesartan (AZOR) 10-40 MG tablet Take 1 tablet by mouth daily. 05/21/18  Yes Hawks, Christy A, FNP  Aspirin-Salicylamide-Caffeine (BC HEADACHE POWDER PO) Take 1 Dose by mouth daily as needed.   Yes [provider]  atorvastatin (LIPITOR) 20 MG tablet Take 1 tablet (20 mg total) by mouth daily. 05/22/18 05/22/19 Yes Hawks, Christy A, FNP  DULoxetine (CYMBALTA) 60 MG capsule Take 1 capsule (60 mg total) by mouth daily. 05/21/18  Yes Hawks, Christy A, FNP  Fluticasone Furoate-Vilanterol (BREO ELLIPTA IN) Inhale 1 puff into the lungs daily.   Yes [provider]  HYDROcodone-homatropine (HYCODAN) 5-1.5 MG/5ML syrup Take 5 mLs by mouth every 8 (eight) hours as needed for cough. 09/05/18  Yes Hawks, Christy A, FNP  levothyroxine (SYNTHROID, LEVOTHROID) 25 MCG tablet Take 1 tablet (25 mcg total) by  mouth daily before breakfast. 04/23/18  Yes Hawks, Christy A, FNP  ondansetron (ZOFRAN) 4 MG tablet Take 1 tablet (4 mg total) by mouth every 8 (eight) hours as needed for nausea. 09/17/18  Yes Hawks, Christy A, FNP  temazepam (RESTORIL) 30 MG capsule Take 1 capsule (30 mg total) by mouth at bedtime as needed for sleep. 05/21/18  Yes Hawks, Christy A, FNP  traMADol (ULTRAM) 50 MG tablet Take 2 tablets (100 mg total) by mouth every 8 (eight) hours as needed (pain). 08/14/18  Yes Hawks, Christy A, FNP  Vitamin D, Ergocalciferol, (DRISDOL) 1.25 MG (50000 UT) CAPS capsule TAKE 1 CAPSULE (50000 UNITS) BY MOUTH EVERY 7 DAYS Patient taking differently: Take 50,000 Units by mouth. TAKE 1 CAPSULE (50000 UNITS) BY MOUTH EVERY 7 DAYS 05/21/18  Yes Evelina Dun A, FNP    Review of Systems:  Negative except as otherwise mentioned in HPI.  Physical Exam: Vitals:  09/19/18 1600 09/19/18 1630 09/19/18 1635 09/19/18 1645  BP: (!) 114/54 118/60    Pulse: 82 79    Resp: 16 12    Temp:   100.2 F (37.9 C) 100.3 F (37.9 C)  TempSrc:   Oral Oral  SpO2: 99% 95%    Weight:       Constitutional: Denies chest pain, complaining of shortness of breath and having difficulty speaking in full sentences.  Patient was warm to touch on examination. Eyes: PERRL, lids and conjunctivae normal, no icterus, no nystagmus. ENMT: Mucous membranes were dry on exam. Posterior pharynx clear of any exudate or lesions. Positive runny nose and nasal congestion Neck: normal, supple, no masses, no thyromegaly, no JVD Respiratory: Positive tachypnea, diffuse rhonchi, mild expiratory wheezing, no using accessory muscles.  Breathing better with oxygen supplementation. Cardiovascular: Sinus tachycardia, no rubs, no gallops, no murmurs, no JVD.  Abdomen: no tenderness, no masses palpated. No hepatosplenomegaly. Bowel sounds positive.  Musculoskeletal: no clubbing / cyanosis. No joint deformity upper and lower extremities. Good ROM, no  contractures. Normal muscle tone.  Skin: no rashes, lesions, ulcers. No induration Neurologic: CN 2-12 grossly intact. Sensation intact, DTR normal. Strength 4/5 in all 4 limbs due to poor effort.  Psychiatric: Normal judgment and insight. Alert and oriented x 3. Normal mood.    Labs on Admission: I have personally reviewed the following labs and imaging studies  CBC: Recent Labs  Lab 09/19/18 1129  WBC 7.6  NEUTROABS 7.2  HGB 10.3*  HCT 32.1*  MCV 88.2  PLT 789   Basic Metabolic Panel: Recent Labs  Lab 09/19/18 1129  NA 136  K 3.2*  CL 102  CO2 25  GLUCOSE 136*  BUN 20  CREATININE 0.90  CALCIUM 8.1*   GFR: Estimated Creatinine Clearance: 42.9 mL/min (by C-G formula based on SCr of 0.9 mg/dL).   Liver Function Tests: Recent Labs  Lab 09/19/18 1129  AST 29  ALT 14  ALKPHOS 70  BILITOT 0.3  PROT 5.8*  ALBUMIN 3.2*   Coagulation Profile: Recent Labs  Lab 09/19/18 1129  INR 0.9   Anemia Panel: Recent Labs    09/19/18 1517  FERRITIN 26   Urine analysis:    Component Value Date/Time   COLORURINE YELLOW 09/19/2018 1129   APPEARANCEUR CLEAR 09/19/2018 1129   APPEARANCEUR Hazy (A) 05/23/2018 1543   LABSPEC 1.014 09/19/2018 1129   PHURINE 5.0 09/19/2018 1129   GLUCOSEU 50 (A) 09/19/2018 1129   HGBUR NEGATIVE 09/19/2018 1129   BILIRUBINUR NEGATIVE 09/19/2018 1129   BILIRUBINUR Negative 05/23/2018 1543   KETONESUR NEGATIVE 09/19/2018 1129   PROTEINUR NEGATIVE 09/19/2018 1129   UROBILINOGEN negative 07/01/2015 1541   UROBILINOGEN 0.2 10/07/2012 1928   NITRITE NEGATIVE 09/19/2018 1129   LEUKOCYTESUR NEGATIVE 09/19/2018 1129    Recent Results (from the past 240 hour(s))  Culture, blood (Routine x 2)     Status: None (Preliminary result)   Collection Time: 09/19/18 11:29 AM  Result Value Ref Range Status   Specimen Description BLOOD RIGHT WRIST  Final   Special Requests   Final    BOTTLES DRAWN AEROBIC AND ANAEROBIC Blood Culture adequate volume  Performed at [facility], [address] [place], [place] [address]    Culture PENDING  Incomplete   Report Status PENDING  Incomplete  Culture, blood (Routine x 2)     Status: None (Preliminary result)   Collection Time: 09/19/18 12:38 PM  Result Value Ref Range Status   Specimen Description BLOOD  LEFT HAND  Final   Special Requests   Final    BOTTLES DRAWN AEROBIC AND ANAEROBIC Blood Culture adequate volume Performed at [facility], [address], [place], [place] [address]    Culture PENDING  Incomplete   Report Status PENDING  Incomplete     Radiological Exams on Admission: Dg Chest Portable 1 View  Result Date: 09/19/2018 CLINICAL DATA:  Dyspnea EXAM: PORTABLE CHEST 1 VIEW COMPARISON:  07/21/2018 chest radiograph. FINDINGS: Stable cardiomediastinal silhouette with normal heart size. No pneumothorax. No pleural effusion. Patchy bibasilar lung opacities are new. IMPRESSION: New patchy bibasilar lung opacities worrisome for multilobar pneumonia. Short-term chest radiograph follow-up advised. Electronically Signed   By: Ilona Sorrel M.D.   On: 09/19/2018 12:08    EKG: Independently reviewed.  Normal QT, sinus tachycardia, normal axis.  Poor R wave progression.  No acute ischemic changes.  Assessment/Plan 1-Sepsis due to Multifocal pneumonia -patient met sepsis criteria on admission (fever, elevated RR, elevated HR hypoxia and findings of multifocal PNA on CXR). -influenza PCR neg -patient symptoms and sick contact to RUI symptoms at home. -RVP, pending -Procalcitonin elevated at 4.9 -Chest x-ray demonstrating multi-lobar infiltrates and patient escalating to require 6-7 L of oxygen supplementation. -Coronavirus test has been ordered -IV fluid resuscitation provided, IV antibiotics initiated, antipyretics using Tylenol order. -PCCM consulted. -Patient transferred to [facility] as part of protocol for COVID rule out patient. -started on inhalers and for now will avoid  nebulizer treatments and steroids. -follow clinical response.  2-acute respiratory failure with hypoxia -In the setting of multilobar pneumonia, COPD exacerbation and concern for coronavirus infection -Continue oxygen supplementation -Continue inhaler management -Follow clinical response  And culture/viral testing results   3-HLD (hyperlipidemia) -Continue statins  4-hypothyroidism -Continue Synthroid -Check TSH  5-Depression and anxiety -No suicidal ideation or hallucination -Mood is stable -Continue home antidepressant/anxiolytic medications.  6-Chronic pain syndrome -Continue as needed tramadol -Avoid the use of NSAIDs.  7-GERD (gastroesophageal reflux disease) -Continue PPI    8-hypertension -continue amlodipine -follow VS and adjust antihypertensive regimen as needed.  9-hypokalemia -In the setting of albuterol usage and decreased oral intake -Will replete and follow electrolytes trend -Check magnesium and phosphorus level.    DVT prophylaxis: heparin  Code Status: Full Code  Family Communication: no family available  Disposition Plan: to be determine; following protocol with concerns for COVID-19 rule out patient, transferring to [facility] stepdown. Consults called: PCCM (Dr. Cicero Duck ) Admission status:  LOS > 2 midnights, inpatient, stepdown.    Time Spent: 70 minutes  Barton Dubois MD [facility] Pager [phone]  09/19/2018, 5:09 PM

## 2018-09-19 NOTE — ED Provider Notes (Signed)
[facility] EMERGENCY DEPARTMENT Provider Note   CSN: [id number] Arrival date & time: 09/19/18  1119    History   Chief Complaint Chief Complaint  Patient presents with  . Cough  . Fever    HPI Stacy Dalton is a 68 y.o. female.     Patient with 3-day history of cough and congestion.  Patient presents with shortness of breath  The history is provided by the patient. No language interpreter was used.  Cough  Cough characteristics:  Productive Sputum characteristics:  Nondescript Severity:  Moderate Onset quality:  Sudden Progression:  Worsening Smoker: Unknown.   Context: not animal exposure   Relieved by:  Nothing Worsened by:  Nothing Ineffective treatments:  None tried Associated symptoms: fever   Associated symptoms: no chest pain, no eye discharge, no headaches and no rash   Fever  Associated symptoms: cough   Associated symptoms: no chest pain, no congestion, no diarrhea, no headaches and no rash     Past Medical History:  Diagnosis Date  . Chronic pain   . COPD (chronic obstructive pulmonary disease) ([place])    told has copd, no current inhaler use  . Cough   . Depression   . GERD (gastroesophageal reflux disease)   . Hypertension   . Hypothyroidism   . Insomnia   . Migraine   . Migraine   . Osteopenia   . Pain management   . Panic attacks   . Small bowel obstruction [facility])     Patient Active Problem List   Diagnosis Date Noted  . Large bowel obstruction ([place])   . Small bowel obstruction ([place]) 07/20/2018  . Hypomagnesemia 07/20/2018  . Cough 03/14/2018  . Fatigue 03/14/2018  . Polypharmacy 02/23/2017  . Hypokalemia 02/23/2017  . Toxic metabolic encephalopathy 22/29/7989  . Chronic pain 02/22/2017  . Anxiety 02/22/2017  . GERD (gastroesophageal reflux disease) 02/22/2017  . Seizure ([place])   . Pain medication agreement signed 10/10/2016  . Opioid dependence ([place]) 10/10/2016  . Status post colostomy takedown 04/12/2016  . Chronic  constipation 12/03/2015  . Peritonitis with abscess of intestine ([place]) 11/29/2015  . COPD GOLD 0 with Asthmatic process  09/20/2015  . Osteopenia 06/10/2014  . Vitamin D deficiency disease 06/10/2014  . Hypothyroidism   . Depression 11/18/2012  . Insomnia 11/18/2012  . Chronic pain syndrome 11/18/2012  . HLD (hyperlipidemia) 09/01/2012  . S/P colostomy ([place]) 04/19/2012  . Normocytic anemia 10/15/2011  . Hypertension 10/13/2011  . Chronic back pain 10/13/2011    Past Surgical History:  Procedure Laterality Date  . ABDOMINAL HYSTERECTOMY    . COLON SURGERY    . COLOSTOMY CLOSURE  04/19/2012   Procedure: COLOSTOMY CLOSURE;  Surgeon: Adin Hector, MD;  Location: [facility];  Service: General;  Laterality: N/A;  Laparotomy, Resection and Closure of Colostomy  . COLOSTOMY TAKEDOWN N/A 04/12/2016   Procedure: [place] TAKEDOWN;  Surgeon: Johnathan Hausen, MD;  Location: [facility];  Service: General;  Laterality: N/A;  . INCONTINENCE SURGERY    . LAPAROTOMY  10/04/2011, colostomy also   Procedure: EXPLORATORY LAPAROTOMY;  Surgeon: Adin Hector, MD;  Location: [facility];  Service: General;  Laterality: N/A;  left partial colectomy with colostomy  . LAPAROTOMY  04/19/2012   Procedure: EXPLORATORY LAPAROTOMY;  Surgeon: Adin Hector, MD;  Location: [facility];  Service: General;  Laterality: N/A;  . LAPAROTOMY N/A 11/29/2015   Procedure: EXPLORATORY LAPAROTOMY; SUBTOTAL COLECTOMY WITH HARTMAN PROCEDURE AND END COLOSTOMY;  Surgeon: Johnathan Hausen, MD;  Location: [facility];  Service: General;  Laterality: N/A;  . TUBAL LIGATION    . VENTRAL HERNIA REPAIR  04/19/2012   Procedure: HERNIA REPAIR VENTRAL ADULT;  Surgeon: Adin Hector, MD;  Location: [facility];  Service: General;  Laterality: N/A;     OB History   No obstetric history on file.      Home Medications    Prior to Admission medications   Medication Sig Start Date End Date Taking? Authorizing Provider  albuterol (VENTOLIN HFA) 108 (90  Base) MCG/ACT inhaler Inhale 2 puffs into the lungs every 6 (six) hours as needed for wheezing or shortness of breath.     [provider]  amLODipine-olmesartan (AZOR) 10-40 MG tablet Take 1 tablet by mouth daily. 05/21/18   [name], FNP  Aspirin-Salicylamide-Caffeine (BC HEADACHE POWDER PO) Take 1 Dose by mouth daily as needed.    [provider]  atorvastatin (LIPITOR) 20 MG tablet Take 1 tablet (20 mg total) by mouth daily. 05/22/18 05/22/19  [name], FNP  DULoxetine (CYMBALTA) 60 MG capsule Take 1 capsule (60 mg total) by mouth daily. 05/21/18   Hawks, Alyse Low A, FNP  Fluticasone Furoate-Vilanterol (BREO ELLIPTA IN) Inhale 1 puff into the lungs daily.    [provider]  HYDROcodone-homatropine (HYCODAN) 5-1.5 MG/5ML syrup Take 5 mLs by mouth every 8 (eight) hours as needed for cough. 09/05/18   [name], FNP  levothyroxine (SYNTHROID, LEVOTHROID) 25 MCG tablet Take 1 tablet (25 mcg total) by mouth daily before breakfast. 04/23/18   Evelina Dun A, FNP  ondansetron (ZOFRAN) 4 MG tablet Take 1 tablet (4 mg total) by mouth every 8 (eight) hours as needed for nausea. 09/17/18   [name], FNP  temazepam (RESTORIL) 30 MG capsule Take 1 capsule (30 mg total) by mouth at bedtime as needed for sleep. 05/21/18   [name], FNP  traMADol (ULTRAM) 50 MG tablet Take 2 tablets (100 mg total) by mouth every 8 (eight) hours as needed (pain). 08/14/18   [name], FNP  Vitamin D, Ergocalciferol, (DRISDOL) 1.25 MG (50000 UT) CAPS capsule TAKE 1 CAPSULE (50000 UNITS) BY MOUTH EVERY 7 DAYS Patient taking differently: Take 50,000 Units by mouth. TAKE 1 CAPSULE (50000 UNITS) BY MOUTH EVERY 7 DAYS 05/21/18   [name], FNP    Family History Family History  Problem Relation Age of Onset  . Heart disease Mother   . Hypertension Mother   . Cancer Sister        sinus  . Diabetes Brother   . Heart disease Brother   . Thyroid disease  Brother   . Cancer Sister        abdominal ?  . Thyroid disease Sister   . Cancer Other        GE junction adenocarcinoma  . Emphysema Father   . Stroke Father   . Hypertension Father   . Heart disease Father   . COPD Sister   . Thyroid disease Sister   . Thyroid disease Sister   . Diabetes Brother   . Thyroid disease Brother   . Thyroid disease Brother   . Hypertension Brother   . Post-traumatic stress disorder Brother   . COPD Brother   . Thyroid disease Brother   . Thyroid disease Brother   . Hypertension Brother   . Transient ischemic attack Brother     Social History Social History   Tobacco Use  . Smoking status: Former  Smoker    Packs/day: 1.50    Years: 20.00    Pack years: 30.00    Types: Cigarettes    Last attempt to quit: 10/04/1998    Years since quitting: 19.9  . Smokeless tobacco: Never Used  Substance Use Topics  . Alcohol use: No  . Drug use: No     Allergies   Toradol [ketorolac tromethamine]; Butalbital-apap-caffeine; Demerol; Esgic [butalbital-apap-caffeine]; and Iodine   Review of Systems Review of Systems  Constitutional: Positive for fever. Negative for appetite change and fatigue.  HENT: Negative for congestion, ear discharge and sinus pressure.   Eyes: Negative for discharge.  Respiratory: Positive for cough.   Cardiovascular: Negative for chest pain.  Gastrointestinal: Negative for abdominal pain and diarrhea.  Genitourinary: Negative for frequency and hematuria.  Musculoskeletal: Negative for back pain.  Skin: Negative for rash.  Neurological: Negative for seizures and headaches.  Psychiatric/Behavioral: Negative for hallucinations.     Physical Exam Updated Vital Signs BP (!) 120/59   Pulse 95   Temp (!) 103.5 F (39.7 C) (Rectal)   Resp 20   SpO2 93%   Physical Exam Vitals signs and nursing note reviewed.  Constitutional:      Appearance: She is well-developed.  HENT:     Head: Normocephalic.     Nose: Nose  normal.  Eyes:     General: No scleral icterus.    Conjunctiva/sclera: Conjunctivae normal.  Neck:     Musculoskeletal: Neck supple.     Thyroid: No thyromegaly.  Cardiovascular:     Rate and Rhythm: Normal rate and regular rhythm.     Heart sounds: No murmur. No friction rub. No gallop.   Pulmonary:     Breath sounds: No stridor. Wheezing present. No rales.  Chest:     Chest wall: No tenderness.  Abdominal:     General: There is no distension.     Tenderness: There is no abdominal tenderness. There is no rebound.  Musculoskeletal: Normal range of motion.  Lymphadenopathy:     Cervical: No cervical adenopathy.  Skin:    Findings: No erythema or rash.  Neurological:     Mental Status: She is oriented to person, place, and time.     Motor: No abnormal muscle tone.     Coordination: Coordination normal.  Psychiatric:        Behavior: Behavior normal.      ED Treatments / Results  Labs (all labs ordered are listed, but only abnormal results are displayed) Labs Reviewed  COMPREHENSIVE METABOLIC PANEL - Abnormal; Notable for the following components:      Result Value   Potassium 3.2 (*)    Glucose, Bld 136 (*)    Calcium 8.1 (*)    Total Protein 5.8 (*)    Albumin 3.2 (*)    All other components within normal limits  CBC WITH DIFFERENTIAL/PLATELET - Abnormal; Notable for the following components:   RBC 3.64 (*)    Hemoglobin 10.3 (*)    HCT 32.1 (*)    Lymphs Abs 0.1 (*)    All other components within normal limits  URINALYSIS, ROUTINE W REFLEX MICROSCOPIC - Abnormal; Notable for the following components:   Glucose, UA 50 (*)    All other components within normal limits  CULTURE, BLOOD (ROUTINE X 2)  CULTURE, BLOOD (ROUTINE X 2)  RESPIRATORY PANEL BY PCR  LACTIC ACID, PLASMA  PROTIME-INR  INFLUENZA PANEL BY PCR (TYPE A & B)  LACTIC ACID, PLASMA  LACTIC ACID,  PLASMA  CBC WITH DIFFERENTIAL/PLATELET  URINALYSIS, ROUTINE W REFLEX MICROSCOPIC    EKG None   Radiology Dg Chest Portable 1 View  Result Date: 09/19/2018 CLINICAL DATA:  Dyspnea EXAM: PORTABLE CHEST 1 VIEW COMPARISON:  07/21/2018 chest radiograph. FINDINGS: Stable cardiomediastinal silhouette with normal heart size. No pneumothorax. No pleural effusion. Patchy bibasilar lung opacities are new. IMPRESSION: New patchy bibasilar lung opacities worrisome for multilobar pneumonia. Short-term chest radiograph follow-up advised. Electronically Signed   By: Ilona Sorrel M.D.   On: 09/19/2018 12:08    Procedures Procedures (including critical care time)  Medications Ordered in ED Medications  ipratropium-albuterol (DUONEB) 0.5-2.5 (3) MG/3ML nebulizer solution (  Not Given 09/19/18 1243)  sodium chloride flush (NS) 0.9 % injection 3 mL (3 mLs Intravenous Given 09/19/18 1138)  sodium chloride 0.9 % bolus 1,500 mL (1,500 mLs Intravenous New Bag/Given 09/19/18 1139)  vancomycin (VANCOCIN) IVPB 1000 mg/200 mL premix (1,000 mg Intravenous New Bag/Given 09/19/18 1158)  piperacillin-tazobactam (ZOSYN) IVPB 3.375 g (0 g Intravenous Stopped 09/19/18 1248)  acetaminophen (TYLENOL) tablet 650 mg (650 mg Oral Given 09/19/18 1142)  ipratropium-albuterol (DUONEB) 0.5-2.5 (3) MG/3ML nebulizer solution 3 mL (3 mLs Nebulization Given 09/19/18 1242)     Initial Impression / Assessment and Plan / ED Course  I have reviewed the triage vital signs and the nursing notes.  Pertinent labs & imaging results that were available during my care of the patient were reviewed by me and considered in my medical decision making (see chart for details).    CRITICAL CARE Performed by: Milton Ferguson Total critical care time: 40 minutes Critical care time was exclusive of separately billable procedures and treating other patients. Critical care was necessary to treat or prevent imminent or life-threatening deterioration. Critical care was time spent personally by me on the following activities: development of treatment plan  with patient and/or surrogate as well as nursing, discussions with consultants, evaluation of patient's response to treatment, examination of patient, obtaining history from patient or surrogate, ordering and performing treatments and interventions, ordering and review of laboratory studies, ordering and review of radiographic studies, pulse oximetry and re-evaluation of patient's condition. Patient with bilateral pneumonia.  She will be admitted to medicine  Final Clinical Impressions(s) / ED Diagnoses   Final diagnoses:  Community acquired pneumonia, unspecified laterality    ED Discharge Orders    None       Milton Ferguson, MD 09/19/18 1313

## 2018-09-19 NOTE — ED Notes (Signed)
02 2l [place] up to 4 L. edp at bedside.

## 2018-09-20 ENCOUNTER — Other Ambulatory Visit: Payer: Self-pay

## 2018-09-20 DIAGNOSIS — Z87891 Personal history of nicotine dependence: Secondary | ICD-10-CM

## 2018-09-20 DIAGNOSIS — K219 Gastro-esophageal reflux disease without esophagitis: Secondary | ICD-10-CM

## 2018-09-20 DIAGNOSIS — G8929 Other chronic pain: Secondary | ICD-10-CM

## 2018-09-20 DIAGNOSIS — J189 Pneumonia, unspecified organism: Secondary | ICD-10-CM

## 2018-09-20 DIAGNOSIS — I10 Essential (primary) hypertension: Secondary | ICD-10-CM

## 2018-09-20 DIAGNOSIS — J449 Chronic obstructive pulmonary disease, unspecified: Secondary | ICD-10-CM

## 2018-09-20 LAB — CBC
HCT: 33.8 % — ABNORMAL LOW (ref 36.0–46.0)
Hemoglobin: 10.5 g/dL — ABNORMAL LOW (ref 12.0–15.0)
MCH: 28.2 pg (ref 26.0–34.0)
MCHC: 31.1 g/dL (ref 30.0–36.0)
MCV: 90.6 fL (ref 80.0–100.0)
Platelets: 178 10*3/uL (ref 150–400)
RBC: 3.73 MIL/uL — ABNORMAL LOW (ref 3.87–5.11)
RDW: 14.1 % (ref 11.5–15.5)
WBC: 11.4 10*3/uL — ABNORMAL HIGH (ref 4.0–10.5)
nRBC: 0 % (ref 0.0–0.2)

## 2018-09-20 LAB — BASIC METABOLIC PANEL
Anion gap: 7 (ref 5–15)
BUN: 11 mg/dL (ref 8–23)
CO2: 24 mmol/L (ref 22–32)
Calcium: 8 mg/dL — ABNORMAL LOW (ref 8.9–10.3)
Chloride: 104 mmol/L (ref 98–111)
Creatinine, Ser: 0.76 mg/dL (ref 0.44–1.00)
GFR calc Af Amer: >60 mL/min (ref >60)
GFR calc non Af Amer: >60 mL/min (ref >60)
Glucose, Bld: 90 mg/dL (ref 70–99)
Potassium: 4.2 mmol/L (ref 3.5–5.1)
Sodium: 135 mmol/L (ref 135–145)

## 2018-09-20 LAB — EXPECTORATED SPUTUM ASSESSMENT W GRAM STAIN, RFLX TO RESP C

## 2018-09-20 LAB — STREP PNEUMONIAE URINARY ANTIGEN: Strep Pneumo Urinary Antigen: POSITIVE — AB

## 2018-09-20 LAB — HIV ANTIBODY (ROUTINE TESTING W REFLEX): HIV Screen 4th Generation wRfx: NONREACTIVE

## 2018-09-20 MED ORDER — ADULT MULTIVITAMIN W/MINERALS CH
1.0000 | ORAL_TABLET | Freq: Every day | ORAL | Status: DC
  Administered 2018-09-20 – 2018-09-24 (×5): 1 via ORAL
  Filled 2018-09-20 (×5): qty 1

## 2018-09-20 MED ORDER — VANCOMYCIN HCL IN DEXTROSE 750-5 MG/150ML-% IV SOLN
750.0000 mg | INTRAVENOUS | Status: DC
  Administered 2018-09-20: 750 mg via INTRAVENOUS
  Filled 2018-09-20 (×2): qty 150

## 2018-09-20 MED ORDER — ENSURE ENLIVE PO LIQD
237.0000 mL | Freq: Two times a day (BID) | ORAL | Status: DC
  Administered 2018-09-20 – 2018-09-22 (×3): 237 mL via ORAL

## 2018-09-20 MED ORDER — ORAL CARE MOUTH RINSE
15.0000 mL | Freq: Two times a day (BID) | OROMUCOSAL | Status: DC
  Administered 2018-09-20 – 2018-09-22 (×6): 15 mL via OROMUCOSAL

## 2018-09-20 MED ORDER — HYDROCODONE-ACETAMINOPHEN 7.5-325 MG PO TABS
1.0000 | ORAL_TABLET | Freq: Four times a day (QID) | ORAL | Status: DC | PRN
  Administered 2018-09-20 – 2018-09-21 (×4): 1 via ORAL
  Filled 2018-09-20 (×4): qty 1

## 2018-09-20 NOTE — Progress Notes (Signed)
Initial Nutrition Assessment  DOCUMENTATION CODES:   (Unable to assess for malnutrition at this time)  INTERVENTION:  - Will order Ensure Enlive po BID, each supplement provides 350 kcal and 20 grams of protein - Will order daily multivitamin with minerals. - Continue to encourage PO intakes.    NUTRITION DIAGNOSIS:   Increased nutrient needs related to acute illness as evidenced by estimated needs.  GOAL:   Patient will meet greater than or equal to 90% of their needs  MONITOR:   PO intake, Supplement acceptance, Weight trends, Labs  REASON FOR ASSESSMENT:   Malnutrition Screening Tool, Consult Assessment of nutrition requirement/status  ASSESSMENT:   68 y.o. female with a past medical history significant for COPD, depression and anxiety, GERD, HTN, hypothyroidism, and chronic pain syndrome. She presented to the ED 2/2 worsening SOB, cough, and fever. Symptoms began 4 days prior to presentation to the ED and have worsened over time. Patient reported associated headaches, general malaise, runny nost, and decreased appetite/oral intake. Patient denied chest pain, N/V/D, abdominal pain, dysuria, hemoptysis, hematemesis, melena, hematochezia, focal weakness or any other complaints.  BMI indicates normal weight. No intakes documented since admission. Patient tested for COVID-19, pending results. RD did not visit patient d/t this. Did not call/talk with RN in person to avoid additional burden at this time.   Per chart review, current weight is 113 lb and this same weight was recorded at [facility] on 04/24/18. Weight at a [facility] facility on 2/12 recorded as 103 lb and on 1/18 as 110 lb. Will continue to monitor weight trends closely.    Medications reviewed; 25 mcg oral synthroid/day, 40 mEq oral K-Dur x1 dose 3/19. Labs reviewed; Ca: 8 mg/dl, Mg: 1.6 mg/dl. IVF; NS @ 75 ml/hr.      NUTRITION - FOCUSED PHYSICAL EXAM:  Will complete at a later date, if possible.  Diet Order:    Diet Order            Diet Heart Room service appropriate? Yes; Fluid consistency: Thin  Diet effective now              EDUCATION NEEDS:   No education needs have been identified at this time  Skin:  Skin Assessment: Reviewed RN Assessment  Last BM:  3/20  Height:   Ht Readings from Last 1 Encounters:  09/19/18 5\' 1"  (1.549 m)    Weight:   Wt Readings from Last 1 Encounters:  09/19/18 51.5 kg    Ideal Body Weight:  47.73 kg  BMI:  Body mass index is 21.45 kg/m.  Estimated Nutritional Needs:   Kcal:  1545-1800 kcal  Protein:  62-77 grams  Fluid:  >/= 1.7 L/day      Jarome Matin, MS, RD, LDN, [facility] Inpatient Clinical Dietitian Pager # [phone] After hours/weekend pager # [phone]

## 2018-09-20 NOTE — Consult Note (Addendum)
[facility]    Date of Admission:  09/19/2018   Total days of antibiotics: 1           vanco/ceftriaxone/azithro               Reason for Consult: Pneumonia    Referring Provider: Dyann Kief   Assessment: Pneumonia COPD  Plan: 1. Await her corona virus testing. 2. Continue her current anbx 3. Await her sputum and blood Cx. 4. Defer to primary team on use of systemic steroids if not improving.   Comment She seems low risk without community spread being documented.  Will leave her isolation as is while we await her testing.    Thank you so much for this interesting consult,  Principal Problem:   Multifocal pneumonia Active Problems:   Hypertension   HLD (hyperlipidemia)   Depression   Chronic pain syndrome   Hypothyroidism   Anxiety   GERD (gastroesophageal reflux disease)   Sepsis (HCC)   Acute respiratory failure with hypoxia (HCC)    atorvastatin  20 mg Oral Daily   Chlorhexidine Gluconate Cloth  6 each Topical Daily   DULoxetine  60 mg Oral Daily   feeding supplement (ENSURE ENLIVE)  237 mL Oral BID BM   fluticasone  1 spray Each Nare Daily   heparin injection (subcutaneous)  5,000 Units Subcutaneous Q8H   levothyroxine  25 mcg Oral QAC breakfast   loratadine  10 mg Oral Daily   mouth rinse  15 mL Mouth Rinse BID   mometasone-formoterol  2 puff Inhalation BID   multivitamin with minerals  1 tablet Oral Daily   pantoprazole  40 mg Oral Daily   umeclidinium bromide  1 puff Inhalation Daily    HPI: Stacy Dalton is a 69 y.o. female with hx of COPD (~ 3 years per pt), chronic pain, GERD, HTN, chronic pain.  Adm on 3-19 with 4 days of worsening SOB, cough, fever. Prior, she was assisting in caring or her grandchildren who have URI symptoms.  In ED she was hypoxic to the 80s, febrile to 103, tachypneic, tachycardic and CXR showed multilobar pneumonia.  Influenza was (-), resp virus panel showed Corona NL63.  COVID 19 test pending.  She feels better today.  She lives in [place]. She has not left [place] this year. Only leaves home to do local errands (groc shopping ect). Aside from grandchildren, no sick exposures.   Review of Systems: Review of Systems  Constitutional: Positive for fever. Negative for chills.  Respiratory: Positive for cough, hemoptysis, sputum production and shortness of breath.   Gastrointestinal: Negative for constipation and diarrhea.  Genitourinary: Negative for dysuria.  Please see HPI. All other systems reviewed and negative.   Past Medical History:  Diagnosis Date   Chronic pain    COPD (chronic obstructive pulmonary disease) ([place])    told has copd, no current inhaler use   Cough    Depression    GERD (gastroesophageal reflux disease)    Hypertension    Hypothyroidism    Insomnia    Migraine    Migraine    Osteopenia    Pain management    Panic attacks    Small bowel obstruction (HCC)     Social History   Tobacco Use   Smoking status: Former Smoker    Packs/day: 1.50    Years: 20.00    Pack years: 30.00    Types: Cigarettes    Last  attempt to quit: 10/04/1998    Years since quitting: 19.9   Smokeless tobacco: Never Used  Substance Use Topics   Alcohol use: No   Drug use: No    Family History  Problem Relation Age of Onset   Heart disease Mother    Hypertension Mother    Cancer Sister        sinus   Diabetes Brother    Heart disease Brother    Thyroid disease Brother    Cancer Sister        abdominal ?   Thyroid disease Sister    Cancer Other        GE junction adenocarcinoma   Emphysema Father    Stroke Father    Hypertension Father    Heart disease Father    COPD Sister    Thyroid disease Sister    Thyroid disease Sister    Diabetes Brother    Thyroid disease Brother    Thyroid disease Brother    Hypertension Brother    Post-traumatic stress disorder Brother    COPD Brother     Thyroid disease Brother    Thyroid disease Brother    Hypertension Brother    Transient ischemic attack Brother      Medications:  Scheduled:  atorvastatin  20 mg Oral Daily   Chlorhexidine Gluconate Cloth  6 each Topical Daily   DULoxetine  60 mg Oral Daily   feeding supplement (ENSURE ENLIVE)  237 mL Oral BID BM   fluticasone  1 spray Each Nare Daily   heparin injection (subcutaneous)  5,000 Units Subcutaneous Q8H   levothyroxine  25 mcg Oral QAC breakfast   loratadine  10 mg Oral Daily   mouth rinse  15 mL Mouth Rinse BID   mometasone-formoterol  2 puff Inhalation BID   multivitamin with minerals  1 tablet Oral Daily   pantoprazole  40 mg Oral Daily   umeclidinium bromide  1 puff Inhalation Daily    Abtx:  Anti-infectives (From admission, onward)   Start     Dose/Rate Route Frequency Ordered Stop   09/20/18 1200  vancomycin (VANCOCIN) IVPB 750 mg/150 ml premix     750 mg 150 mL/hr over 60 Minutes Intravenous Every 24 hours 09/20/18 0158     09/19/18 1800  cefTRIAXone (ROCEPHIN) 1 g in sodium chloride 0.9 % 100 mL IVPB     1 g 200 mL/hr over 30 Minutes Intravenous Every 24 hours 09/19/18 1702 09/26/18 1759   09/19/18 1800  azithromycin (ZITHROMAX) 500 mg in sodium chloride 0.9 % 250 mL IVPB     500 mg 250 mL/hr over 60 Minutes Intravenous Every 24 hours 09/19/18 1702 09/26/18 1759   09/19/18 1145  vancomycin (VANCOCIN) IVPB 1000 mg/200 mL premix     1,000 mg 200 mL/hr over 60 Minutes Intravenous  Once 09/19/18 1138 09/19/18 1258   09/19/18 1145  piperacillin-tazobactam (ZOSYN) IVPB 3.375 g     3.375 g 100 mL/hr over 30 Minutes Intravenous  Once 09/19/18 1138 09/19/18 1248        OBJECTIVE: Blood pressure 133/61, pulse 77, temperature 98.4 F (36.9 C), temperature source Oral, resp. rate 17, height 5\' 1"  (1.549 m), weight 51.5 kg, SpO2 100 %.  Physical Exam Constitutional:      Appearance: She is ill-appearing.  HENT:     Mouth/Throat:      Mouth: Mucous membranes are moist.     Pharynx: No oropharyngeal exudate.  Eyes:     Extraocular Movements:  Extraocular movements intact.     Conjunctiva/sclera:     Right eye: Right conjunctiva is injected.     Left eye: Left conjunctiva is injected.     Pupils: Pupils are equal, round, and reactive to light.  Neck:     Musculoskeletal: Normal range of motion and neck supple.  Cardiovascular:     Rate and Rhythm: Normal rate and regular rhythm.  Pulmonary:     Breath sounds: Rhonchi present.  Abdominal:     General: Bowel sounds are normal. There is no distension.     Palpations: Abdomen is soft.     Tenderness: There is no abdominal tenderness.  Musculoskeletal:     Right lower leg: No edema.     Left lower leg: No edema.  Neurological:     General: No focal deficit present.     Mental Status: She is alert.     Lab Results Results for orders placed or performed during the hospital encounter of 09/19/18 (from the past 48 hour(s))  Comprehensive metabolic panel     Status: Abnormal   Collection Time: 09/19/18 11:29 AM  Result Value Ref Range   Sodium 136 135 - 145 mmol/L   Potassium 3.2 (L) 3.5 - 5.1 mmol/L   Chloride 102 98 - 111 mmol/L   CO2 25 22 - 32 mmol/L   Glucose, Bld 136 (H) 70 - 99 mg/dL   BUN 20 8 - 23 mg/dL   Creatinine, Ser 0.90 0.44 - 1.00 mg/dL   Calcium 8.1 (L) 8.9 - 10.3 mg/dL   Total Protein 5.8 (L) 6.5 - 8.1 g/dL   Albumin 3.2 (L) 3.5 - 5.0 g/dL   AST 29 15 - 41 U/L   ALT 14 0 - 44 U/L   Alkaline Phosphatase 70 38 - 126 U/L   Total Bilirubin 0.3 0.3 - 1.2 mg/dL   GFR calc non Af Amer >60 >60 mL/min   GFR calc Af Amer >60 >60 mL/min   Anion gap 9 5 - 15    Comment: Performed at [facility], [address], [place], [place] [address]  Lactic acid, plasma     Status: None   Collection Time: 09/19/18 11:29 AM  Result Value Ref Range   Lactic Acid, Venous 1.7 0.5 - 1.9 mmol/L    Comment: Performed at [facility], [address], [place], [place]  [address]  CBC with Differential     Status: Abnormal   Collection Time: 09/19/18 11:29 AM  Result Value Ref Range   WBC 7.6 4.0 - 10.5 K/uL   RBC 3.64 (L) 3.87 - 5.11 MIL/uL   Hemoglobin 10.3 (L) 12.0 - 15.0 g/dL   HCT 32.1 (L) 36.0 - 46.0 %   MCV 88.2 80.0 - 100.0 fL   MCH 28.3 26.0 - 34.0 pg   MCHC 32.1 30.0 - 36.0 g/dL   RDW 14.0 11.5 - 15.5 %   Platelets 235 150 - 400 K/uL   nRBC 0.0 0.0 - 0.2 %   Neutrophils Relative % 94 %   Neutro Abs 7.2 1.7 - 7.7 K/uL   Lymphocytes Relative 2 %   Lymphs Abs 0.1 (L) 0.7 - 4.0 K/uL   Monocytes Relative 3 %   Monocytes Absolute 0.2 0.1 - 1.0 K/uL   Eosinophils Relative 1 %   Eosinophils Absolute 0.1 0.0 - 0.5 K/uL   Basophils Relative 0 %   Basophils Absolute 0.0 0.0 - 0.1 K/uL   Immature Granulocytes 0 %   Abs  Immature Granulocytes 0.03 0.00 - 0.07 K/uL    Comment: Performed at [facility], [address] [place], [place] [address]  Protime-INR     Status: None   Collection Time: 09/19/18 11:29 AM  Result Value Ref Range   Prothrombin Time 12.1 11.4 - 15.2 seconds   INR 0.9 0.8 - 1.2    Comment: (NOTE) INR goal varies based on device and disease states. Performed at [facility], [address] [place], [place] [address]   Culture, blood (Routine x 2)     Status: None (Preliminary result)   Collection Time: 09/19/18 11:29 AM  Result Value Ref Range   Specimen Description BLOOD RIGHT WRIST    Special Requests      BOTTLES DRAWN AEROBIC AND ANAEROBIC Blood Culture adequate volume   Culture      NO GROWTH < 24 HOURS Performed at [facility], [address], [place], [place] [address]    Report Status PENDING   Urinalysis, Routine w reflex microscopic     Status: Abnormal   Collection Time: 09/19/18 11:29 AM  Result Value Ref Range   Color, Urine YELLOW YELLOW   APPearance CLEAR CLEAR   Specific Gravity, Urine 1.014 1.005 - 1.030   pH 5.0 5.0 - 8.0   Glucose, UA 50 (A) NEGATIVE mg/dL   Hgb urine dipstick NEGATIVE NEGATIVE    Bilirubin Urine NEGATIVE NEGATIVE   Ketones, ur NEGATIVE NEGATIVE mg/dL   Protein, ur NEGATIVE NEGATIVE mg/dL   Nitrite NEGATIVE NEGATIVE   Leukocytes,Ua NEGATIVE NEGATIVE    Comment: Performed at [facility], [address] [place], [place] [address]  Influenza panel by PCR (type A & B)     Status: None   Collection Time: 09/19/18 11:40 AM  Result Value Ref Range   Influenza A By PCR NEGATIVE NEGATIVE   Influenza B By PCR NEGATIVE NEGATIVE    Comment: (NOTE) The Xpert Xpress Flu assay is intended as an aid in the diagnosis of  influenza and should not be used as a sole basis for treatment.  This  assay is FDA approved for nasopharyngeal swab specimens only. Nasal  washings and aspirates are unacceptable for Xpert Xpress Flu testing. Performed at [facility], [address], [place], [place] [address]   Respiratory Panel by PCR     Status: Abnormal   Collection Time: 09/19/18 11:41 AM  Result Value Ref Range   Adenovirus NOT DETECTED NOT DETECTED   Coronavirus 229E NOT DETECTED NOT DETECTED    Comment: (NOTE) The Coronavirus on the Respiratory Panel, DOES NOT test for the novel  Coronavirus (2019 nCoV)    Coronavirus HKU1 NOT DETECTED NOT DETECTED   Coronavirus NL63 DETECTED (A) NOT DETECTED   Coronavirus OC43 NOT DETECTED NOT DETECTED   Metapneumovirus NOT DETECTED NOT DETECTED   Rhinovirus / Enterovirus NOT DETECTED NOT DETECTED   Influenza A NOT DETECTED NOT DETECTED   Influenza B NOT DETECTED NOT DETECTED   Parainfluenza Virus 1 NOT DETECTED NOT DETECTED   Parainfluenza Virus 2 NOT DETECTED NOT DETECTED   Parainfluenza Virus 3 NOT DETECTED NOT DETECTED   Parainfluenza Virus 4 NOT DETECTED NOT DETECTED   Respiratory Syncytial Virus NOT DETECTED NOT DETECTED   Bordetella pertussis NOT DETECTED NOT DETECTED   Chlamydophila pneumoniae NOT DETECTED NOT DETECTED   Mycoplasma pneumoniae NOT DETECTED NOT DETECTED    Comment: Performed at [facility], [place] [address] [place],  [address]  Culture, blood (Routine x 2)  Status: None (Preliminary result)   Collection Time: 09/19/18 12:38 PM  Result Value Ref Range   Specimen Description BLOOD LEFT HAND    Special Requests      BOTTLES DRAWN AEROBIC AND ANAEROBIC Blood Culture adequate volume   Culture      NO GROWTH < 24 HOURS Performed at [facility], [address] [place], [place] [address]    Report Status PENDING   Lactic acid, plasma     Status: None   Collection Time: 09/19/18  3:17 PM  Result Value Ref Range   Lactic Acid, Venous 1.3 0.5 - 1.9 mmol/L    Comment: Performed at [facility], [address], [place], [place] [address]  Lactate dehydrogenase     Status: None   Collection Time: 09/19/18  3:17 PM  Result Value Ref Range   LDH 120 98 - 192 U/L    Comment: Performed at [facility], [address] [place], [place] [address]  Ferritin     Status: None   Collection Time: 09/19/18  3:17 PM  Result Value Ref Range   Ferritin 26 11 - 307 ng/mL    Comment: Performed at [facility], [address] [place], [place] [address]  C-reactive protein     Status: Abnormal   Collection Time: 09/19/18  3:17 PM  Result Value Ref Range   CRP 5.1 (H) <1.0 mg/dL    Comment: Performed at [place] [address], [place], [place] [address]  Procalcitonin - Baseline     Status: None   Collection Time: 09/19/18  3:17 PM  Result Value Ref Range   Procalcitonin 4.90 ng/mL    Comment:        Interpretation: PCT > 2 ng/mL: Systemic infection (sepsis) is likely, unless other causes are known. (NOTE)       Sepsis PCT Algorithm           Lower Respiratory Tract                                      Infection PCT Algorithm    ----------------------------     ----------------------------         PCT < 0.25 ng/mL                PCT < 0.10 ng/mL         Strongly encourage             Strongly discourage   discontinuation of antibiotics    initiation of antibiotics     ----------------------------     -----------------------------       PCT 0.25 - 0.50 ng/mL            PCT 0.10 - 0.25 ng/mL               OR       >80% decrease in PCT            Discourage initiation of                                            antibiotics      Encourage discontinuation           of antibiotics    ----------------------------     -----------------------------  PCT >= 0.50 ng/mL              PCT 0.26 - 0.50 ng/mL               AND       <80% decrease in PCT              Encourage initiation of                                             antibiotics       Encourage continuation           of antibiotics    ----------------------------     -----------------------------        PCT >= 0.50 ng/mL                  PCT > 0.50 ng/mL               AND         increase in PCT                  Strongly encourage                                      initiation of antibiotics    Strongly encourage escalation           of antibiotics                                     -----------------------------                                           PCT <= 0.25 ng/mL                                                 OR                                        > 80% decrease in PCT                                     Discontinue / Do not initiate                                             antibiotics Performed at [facility], [address], [place], [place] [address]   HIV antibody (Routine Screening)     Status: None   Collection Time: 09/19/18  3:17 PM  Result Value Ref Range   HIV Screen 4th Generation wRfx Non Reactive Non Reactive    Comment: (NOTE) Performed At: [facility] [place], [place] [id number] Rush Farmer MD  [id number]   TSH     Status: None   Collection Time: 09/19/18  3:17 PM  Result Value Ref Range   TSH 1.372 0.350 - 4.500 uIU/mL    Comment: Performed by a 3rd Generation assay with a functional sensitivity of <=0.01  uIU/mL. Performed at [facility], [address] [place], [place] [address]   Magnesium     Status: Abnormal   Collection Time: 09/19/18  3:17 PM  Result Value Ref Range   Magnesium 1.6 (L) 1.7 - 2.4 mg/dL    Comment: Performed at [facility], [address], [place], [place] [address]  Phosphorus     Status: None   Collection Time: 09/19/18  3:17 PM  Result Value Ref Range   Phosphorus 2.8 2.5 - 4.6 mg/dL    Comment: Performed at [facility], [address], [place], [place] [address]  MRSA PCR Screening     Status: None   Collection Time: 09/19/18  7:07 PM  Result Value Ref Range   MRSA by PCR NEGATIVE NEGATIVE    Comment:        The GeneXpert MRSA Assay (FDA approved for NASAL specimens only), is one component of a comprehensive MRSA colonization surveillance program. It is not intended to diagnose MRSA infection nor to guide or monitor treatment for MRSA infections. Performed at [facility], [place] [address], [place], [place] [address]   CBC     Status: Abnormal   Collection Time: 09/20/18  5:16 AM  Result Value Ref Range   WBC 11.4 (H) 4.0 - 10.5 K/uL   RBC 3.73 (L) 3.87 - 5.11 MIL/uL   Hemoglobin 10.5 (L) 12.0 - 15.0 g/dL   HCT 33.8 (L) 36.0 - 46.0 %   MCV 90.6 80.0 - 100.0 fL   MCH 28.2 26.0 - 34.0 pg   MCHC 31.1 30.0 - 36.0 g/dL   RDW 14.1 11.5 - 15.5 %   Platelets 178 150 - 400 K/uL   nRBC 0.0 0.0 - 0.2 %    Comment: Performed at [facility], [place] [address], [place], [place] [address]  Basic metabolic panel     Status: Abnormal   Collection Time: 09/20/18  5:16 AM  Result Value Ref Range   Sodium 135 135 - 145 mmol/L   Potassium 4.2 3.5 - 5.1 mmol/L   Chloride 104 98 - 111 mmol/L   CO2 24 22 - 32 mmol/L   Glucose, Bld 90 70 - 99 mg/dL   BUN 11 8 - 23 mg/dL   Creatinine, Ser 0.76 0.44 - 1.00 mg/dL   Calcium 8.0 (L) 8.9 - 10.3 mg/dL   GFR calc non Af Amer >60 >60 mL/min   GFR calc Af Amer >60 >60 mL/min   Anion gap  7 5 - 15    Comment: Performed at [facility], [place] [address], [place], [place] [address]  Expectorated sputum assessment w rflx to resp cult     Status: None   Collection Time: 09/20/18  9:56 AM  Result Value Ref Range   Specimen Description SPUTUM    Special Requests NONE    Sputum evaluation      THIS SPECIMEN IS ACCEPTABLE FOR SPUTUM CULTURE Performed at [facility], [place] [address], [place], [place] [address]    Report Status 09/20/2018 FINAL       Component Value Date/Time   SDES SPUTUM 09/20/2018 0956   SPECREQUEST NONE 09/20/2018 0956   CULT  09/19/2018 1238    NO GROWTH < 24 HOURS Performed at [facility], [address], [place], [place] [address]    REPTSTATUS 09/20/2018 FINAL 09/20/2018 2542   Dg Chest Portable 1 View  Result Date: 09/19/2018 CLINICAL DATA:  Dyspnea EXAM: PORTABLE CHEST 1 VIEW COMPARISON:  07/21/2018 chest radiograph. FINDINGS: Stable cardiomediastinal silhouette with normal heart size. No pneumothorax. No pleural effusion. Patchy bibasilar lung opacities are new. IMPRESSION: New patchy bibasilar lung opacities worrisome for multilobar pneumonia. Short-term chest radiograph follow-up advised. Electronically Signed   By: Ilona Sorrel M.D.   On: 09/19/2018 12:08   Recent Results (from the past 240 hour(s))  Culture, blood (Routine x 2)     Status: None (Preliminary result)   Collection Time: 09/19/18 11:29 AM  Result Value Ref Range Status   Specimen Description BLOOD RIGHT WRIST  Final   Special Requests   Final    BOTTLES DRAWN AEROBIC AND ANAEROBIC Blood Culture adequate volume   Culture   Final    NO GROWTH < 24 HOURS Performed at [facility], [address] [place], [place] [address]    Report Status PENDING  Incomplete  Respiratory Panel by PCR     Status: Abnormal   Collection Time: 09/19/18 11:41 AM  Result Value Ref Range Status   Adenovirus NOT DETECTED NOT DETECTED Final   Coronavirus 229E NOT  DETECTED NOT DETECTED Final    Comment: (NOTE) The Coronavirus on the Respiratory Panel, DOES NOT test for the novel  Coronavirus (2019 nCoV)    Coronavirus HKU1 NOT DETECTED NOT DETECTED Final   Coronavirus NL63 DETECTED (A) NOT DETECTED Final   Coronavirus OC43 NOT DETECTED NOT DETECTED Final   Metapneumovirus NOT DETECTED NOT DETECTED Final   Rhinovirus / Enterovirus NOT DETECTED NOT DETECTED Final   Influenza A NOT DETECTED NOT DETECTED Final   Influenza B NOT DETECTED NOT DETECTED Final   Parainfluenza Virus 1 NOT DETECTED NOT DETECTED Final   Parainfluenza Virus 2 NOT DETECTED NOT DETECTED Final   Parainfluenza Virus 3 NOT DETECTED NOT DETECTED Final   Parainfluenza Virus 4 NOT DETECTED NOT DETECTED Final   Respiratory Syncytial Virus NOT DETECTED NOT DETECTED Final   Bordetella pertussis NOT DETECTED NOT DETECTED Final   Chlamydophila pneumoniae NOT DETECTED NOT DETECTED Final   Mycoplasma pneumoniae NOT DETECTED NOT DETECTED Final    Comment: Performed at [facility], [place] [address], [place], [place] [address]  Culture, blood (Routine x 2)     Status: None (Preliminary result)   Collection Time: 09/19/18 12:38 PM  Result Value Ref Range Status   Specimen Description BLOOD LEFT HAND  Final   Special Requests   Final    BOTTLES DRAWN AEROBIC AND ANAEROBIC Blood Culture adequate volume   Culture   Final    NO GROWTH < 24 HOURS Performed at [facility], [address] [place], [place] [address]    Report Status PENDING  Incomplete  MRSA PCR Screening     Status: None   Collection Time: 09/19/18  7:07 PM  Result Value Ref Range Status   MRSA by PCR NEGATIVE NEGATIVE Final    Comment:        The GeneXpert MRSA Assay (FDA approved for NASAL specimens only), is one component of a comprehensive MRSA colonization surveillance program. It is not intended to diagnose MRSA infection nor to guide or monitor treatment for MRSA infections. Performed at [facility], [place] [address] [place],  [place] [address]   Expectorated sputum assessment w rflx to resp cult     Status: None   Collection Time: 09/20/18  9:56 AM  Result Value Ref Range Status   Specimen Description SPUTUM  Final   Special Requests NONE  Final   Sputum evaluation   Final    THIS SPECIMEN IS ACCEPTABLE FOR SPUTUM CULTURE Performed at [facility], [place] [address] [place], [place] [address]    Report Status 09/20/2018 FINAL  Final    Microbiology: Recent Results (from the past 240 hour(s))  Culture, blood (Routine x 2)     Status: None (Preliminary result)   Collection Time: 09/19/18 11:29 AM  Result Value Ref Range Status   Specimen Description BLOOD RIGHT WRIST  Final   Special Requests   Final    BOTTLES DRAWN AEROBIC AND ANAEROBIC Blood Culture adequate volume   Culture   Final    NO GROWTH < 24 HOURS Performed at [facility], [address], [place], [place] [address]    Report Status PENDING  Incomplete  Respiratory Panel by PCR     Status: Abnormal   Collection Time: 09/19/18 11:41 AM  Result Value Ref Range Status   Adenovirus NOT DETECTED NOT DETECTED Final   Coronavirus 229E NOT DETECTED NOT DETECTED Final    Comment: (NOTE) The Coronavirus on the Respiratory Panel, DOES NOT test for the novel  Coronavirus (2019 nCoV)    Coronavirus HKU1 NOT DETECTED NOT DETECTED Final   Coronavirus NL63 DETECTED (A) NOT DETECTED Final   Coronavirus OC43 NOT DETECTED NOT DETECTED Final   Metapneumovirus NOT DETECTED NOT DETECTED Final   Rhinovirus / Enterovirus NOT DETECTED NOT DETECTED Final   Influenza A NOT DETECTED NOT DETECTED Final   Influenza B NOT DETECTED NOT DETECTED Final   Parainfluenza Virus 1 NOT DETECTED NOT DETECTED Final   Parainfluenza Virus 2 NOT DETECTED NOT DETECTED Final   Parainfluenza Virus 3 NOT DETECTED NOT DETECTED Final   Parainfluenza Virus 4 NOT DETECTED NOT DETECTED Final   Respiratory Syncytial Virus  NOT DETECTED NOT DETECTED Final   Bordetella pertussis NOT DETECTED NOT DETECTED Final   Chlamydophila pneumoniae NOT DETECTED NOT DETECTED Final   Mycoplasma pneumoniae NOT DETECTED NOT DETECTED Final    Comment: Performed at [facility], [place] [address], [place], [place] [address]  Culture, blood (Routine x 2)     Status: None (Preliminary result)   Collection Time: 09/19/18 12:38 PM  Result Value Ref Range Status   Specimen Description BLOOD LEFT HAND  Final   Special Requests   Final    BOTTLES DRAWN AEROBIC AND ANAEROBIC Blood Culture adequate volume   Culture   Final    NO GROWTH < 24 HOURS Performed at [facility], [address] [address], [place], [place] [address]    Report Status PENDING  Incomplete  MRSA PCR Screening     Status: None   Collection Time: 09/19/18  7:07 PM  Result Value Ref Range Status   MRSA by PCR NEGATIVE NEGATIVE Final    Comment:        The GeneXpert MRSA Assay (FDA approved for NASAL specimens only), is one component of a comprehensive MRSA colonization surveillance program. It is not intended to diagnose MRSA infection nor to guide or monitor treatment for MRSA infections. Performed at [facility], [place] [address], [place], [place] [address]   Expectorated sputum assessment w rflx to resp cult  Status: None   Collection Time: 09/20/18  9:56 AM  Result Value Ref Range Status   Specimen Description SPUTUM  Final   Special Requests NONE  Final   Sputum evaluation   Final    THIS SPECIMEN IS ACCEPTABLE FOR SPUTUM CULTURE Performed at [facility], [place] [address], [place], [place] [address]    Report Status 09/20/2018 FINAL  Final    Radiographs and labs were personally reviewed by me.   Bobby Rumpf, MD [facility] [place] [facility] [phone] 09/20/2018, 10:46 AM

## 2018-09-20 NOTE — Progress Notes (Signed)
PT Cancellation Note  Patient Details Name: Stacy Dalton MRN: [id number] DOB: [birth year]   Cancelled Treatment:      Per health system leadership, therapy services are being held at this time until patient tests negative for COVID-19.      Thamar Holik 09/20/2018, 7:06 AM

## 2018-09-20 NOTE — Progress Notes (Signed)
PROGRESS NOTE    Stacy Dalton  VZC:[id number] DOB: [birth year] DOA: 09/19/2018 PCP: [name], FNP    Brief Narrative:   Stacy Dalton is a 68 y.o. female with a past medical history significant for COPD, chronic pain, depression and/anxiety, gastroesophageal reflux disease, hypertension, hypothyroidism and and chronic pain syndrome; who presented to the emergency department secondary to worsening shortness of breath, cough and fever.  Symptoms have been present for the last 4 days prior to admission and worsening.  Patient reported associated headaches and general malaise.  There has been runny nose and decreased appetite/oral intake.  Patient denies chest pain, nausea, vomiting, diarrhea, abdominal pain, dysuria, hemoptysis, hematemesis, melena, hematochezia, focal weakness or any other complaints. Patient reports been in contact with her grandkids and caring for them since the schools are closed; they have had URI symptoms.  No other sick contacts that she is aware, no recent travels.  In the ED patient was found with increased requirement for oxygen supplementation (O2 sat on room air in the low 80s), increased respiratory rate, tachycardic, febrile up to 103 and with lymphopenia on her CBC with differential.  Chest x-ray demonstrated multilobar pneumonia.  Fluid resuscitation given as per sepsis protocol, started on IV antibiotics; influenza by PCR negative, RVP and COVID-19 swabs has been taken.   Assessment & Plan:   Principal Problem:   Multifocal pneumonia Active Problems:   Hypertension   HLD (hyperlipidemia)   Depression   Chronic pain syndrome   Hypothyroidism   Anxiety   GERD (gastroesophageal reflux disease)   Sepsis ([place])   Acute respiratory failure with hypoxia (HCC)   Sepsis, present on admission Acute hypoxic respiratory failure Multifocal pneumonia Patient presenting as a transfer from [facility] with progressive fever, cough and dyspnea  over the past 4 days.  Patient without any recent travel but has been watching her grandchildren over the past 4 days as they have been out of school with a sick contact with upper respiratory symptoms.  Patient comorbidities include COPD and hypertension.  Patient was febrile up to 103.5 with a white blood cell count 11.4 and lymphopenia.  LFTs within normal limits, MRSA PCR negative, rapid flu PCR negative.  LDH elevated at 120 with a normal ferritin.  CRP elevated to 5.1 with a procalcitonin of 4.90.  Respiratory viral panel positive for coronavirus NL 63.  Chest x-ray notable for patchy bibasilar opacities consistent with multilobar pneumonia.  Initially was on room air but quickly desaturated requiring high flow nasal cannula at outside hospital. --PCCM and Infectious disease following; appreciate assistance --COVID-19 pending --Blood and sputum cultures pending --Continue broad-spectrum antimicrobial coverage with vancomycin, azithromycin, ceftriaxone --Holding off steroids at this point --Continue airborne isolation awaiting results of COVID-19 --Continue to titrate supplemental oxygen to maintain SPO2 greater than 90% --Supportive care  HLD  --Continue statin  Hypothyroidism TSH 1.372, wnl --Continue Synthroid 95mcg PO daily  Depression and anxiety --Continue home duloxetine 60 mg p.o. daily  Chronic pain syndrome --Norco prn  GERD  --Continue PPI    Essential hypertension --continue amlodipine  Hypokalemia Etiology likely albuterol usage and decreased oral intake --repleted --Continue to monitor daily electrolytes with magnesium  DVT prophylaxis: Heparin Code Status: Full code Family Communication: None Disposition Plan: Continue current level care, SDU, airborne isolation   Consultants:   PCCM  Infectious Disease  Procedures:   none  Antimicrobials:   Vancomycin 3/19>>>  Azithromycin 3/19>>>  Ceftriaxone 3/19>>>   Subjective: Patient seen and  examined at bedside, in mild distress.  Complaining of diffuse myalgias/pain.  Continues with significant dyspnea on high flow nasal cannula.  Also complains of chills, myalgias and global weakness.  No other complaints at this time.  Denies headache, no visual changes, no chest pain, no palpitations, no abdominal pain, no issues with bowel/bladder function, no paresthesias.  No acute concerns overnight per nursing staff.  Objective: Vitals:   09/20/18 0600 09/20/18 0800 09/20/18 0900 09/20/18 1000  BP: (!) 138/57 140/68 138/63 133/61  Pulse: 73 75 (!) 103 77  Resp: 15 17 (!) 24 17  Temp:   98.4 F (36.9 C)   TempSrc:   Oral   SpO2: 99% 99% 98% 100%  Weight:      Height:        Intake/Output Summary (Last 24 hours) at 09/20/2018 1210 Last data filed at 09/20/2018 1000 Gross per 24 hour  Intake 3540.08 ml  Output -  Net 3540.08 ml   Filed Weights   09/19/18 1300 09/19/18 1832  Weight: 46.7 kg 51.5 kg    Examination:  General exam: No acute distress, mild discomfort, appears ill Respiratory system: Coarse breath sounds bilaterally with diffuse rhonchi and mild expiratory wheezing, no accessory muscle use, continues on high flow nasal cannula at 7 L/min Cardiovascular system: S1 & S2 heard, RRR. No JVD, murmurs, rubs, gallops or clicks. No pedal edema. Gastrointestinal system: Abdomen is nondistended, soft and nontender. No organomegaly or masses felt. Normal bowel sounds heard. Central nervous system: Alert and oriented. No focal neurological deficits. Extremities: Symmetric 5 x 5 power. Skin: No rashes, lesions or ulcers Psychiatry: Judgement and insight appear normal. Mood & affect appropriate.     Data Reviewed: I have personally reviewed following labs and imaging studies  CBC: Recent Labs  Lab 09/19/18 1129 09/20/18 0516  WBC 7.6 11.4*  NEUTROABS 7.2  --   HGB 10.3* 10.5*  HCT 32.1* 33.8*  MCV 88.2 90.6  PLT 235 517   Basic Metabolic Panel: Recent Labs  Lab  09/19/18 1129 09/19/18 1517 09/20/18 0516  NA 136  --  135  K 3.2*  --  4.2  CL 102  --  104  CO2 25  --  24  GLUCOSE 136*  --  90  BUN 20  --  11  CREATININE 0.90  --  0.76  CALCIUM 8.1*  --  8.0*  MG  --  1.6*  --   PHOS  --  2.8  --    GFR: Estimated Creatinine Clearance: 49.4 mL/min (by C-G formula based on SCr of 0.76 mg/dL). Liver Function Tests: Recent Labs  Lab 09/19/18 1129  AST 29  ALT 14  ALKPHOS 70  BILITOT 0.3  PROT 5.8*  ALBUMIN 3.2*   No results for input(s): LIPASE, AMYLASE in the last 168 hours. No results for input(s): AMMONIA in the last 168 hours. Coagulation Profile: Recent Labs  Lab 09/19/18 1129  INR 0.9   Cardiac Enzymes: No results for input(s): CKTOTAL, CKMB, CKMBINDEX, TROPONINI in the last 168 hours. BNP (last 3 results) No results for input(s): PROBNP in the last 8760 hours. HbA1C: No results for input(s): HGBA1C in the last 72 hours. CBG: No results for input(s): GLUCAP in the last 168 hours. Lipid Profile: No results for input(s): CHOL, HDL, LDLCALC, TRIG, CHOLHDL, LDLDIRECT in the last 72 hours. Thyroid Function Tests: Recent Labs    09/19/18 1517  TSH 1.372   Anemia Panel: Recent Labs    09/19/18 1517  FERRITIN 26   Sepsis Labs: Recent Labs  Lab 09/19/18 1129 09/19/18 1517  PROCALCITON  --  4.90  LATICACIDVEN 1.7 1.3    Recent Results (from the past 240 hour(s))  Culture, blood (Routine x 2)     Status: None (Preliminary result)   Collection Time: 09/19/18 11:29 AM  Result Value Ref Range Status   Specimen Description BLOOD RIGHT WRIST  Final   Special Requests   Final    BOTTLES DRAWN AEROBIC AND ANAEROBIC Blood Culture adequate volume   Culture   Final    NO GROWTH < 24 HOURS Performed at [facility], [address] [place], [place] [address]    Report Status PENDING  Incomplete  Respiratory Panel by PCR     Status: Abnormal   Collection Time: 09/19/18 11:41 AM  Result Value Ref Range Status    Adenovirus NOT DETECTED NOT DETECTED Final   Coronavirus 229E NOT DETECTED NOT DETECTED Final    Comment: (NOTE) The Coronavirus on the Respiratory Panel, DOES NOT test for the novel  Coronavirus (2019 nCoV)    Coronavirus HKU1 NOT DETECTED NOT DETECTED Final   Coronavirus NL63 DETECTED (A) NOT DETECTED Final   Coronavirus OC43 NOT DETECTED NOT DETECTED Final   Metapneumovirus NOT DETECTED NOT DETECTED Final   Rhinovirus / Enterovirus NOT DETECTED NOT DETECTED Final   Influenza A NOT DETECTED NOT DETECTED Final   Influenza B NOT DETECTED NOT DETECTED Final   Parainfluenza Virus 1 NOT DETECTED NOT DETECTED Final   Parainfluenza Virus 2 NOT DETECTED NOT DETECTED Final   Parainfluenza Virus 3 NOT DETECTED NOT DETECTED Final   Parainfluenza Virus 4 NOT DETECTED NOT DETECTED Final   Respiratory Syncytial Virus NOT DETECTED NOT DETECTED Final   Bordetella pertussis NOT DETECTED NOT DETECTED Final   Chlamydophila pneumoniae NOT DETECTED NOT DETECTED Final   Mycoplasma pneumoniae NOT DETECTED NOT DETECTED Final    Comment: Performed at [facility], [place] [address], [place], [place] [address]  Culture, blood (Routine x 2)     Status: None (Preliminary result)   Collection Time: 09/19/18 12:38 PM  Result Value Ref Range Status   Specimen Description BLOOD LEFT HAND  Final   Special Requests   Final    BOTTLES DRAWN AEROBIC AND ANAEROBIC Blood Culture adequate volume   Culture   Final    NO GROWTH < 24 HOURS Performed at [facility], [address], [place], [place] [address]    Report Status PENDING  Incomplete  MRSA PCR Screening     Status: None   Collection Time: 09/19/18  7:07 PM  Result Value Ref Range Status   MRSA by PCR NEGATIVE NEGATIVE Final    Comment:        The GeneXpert MRSA Assay (FDA approved for NASAL specimens only), is one component of a comprehensive MRSA colonization surveillance program. It is not intended to diagnose MRSA infection nor to guide or  monitor treatment for MRSA infections. Performed at [facility], [place] [address], [place], [place] [address]   Expectorated sputum assessment w rflx to resp cult     Status: None   Collection Time: 09/20/18  9:56 AM  Result Value Ref Range Status   Specimen Description SPUTUM  Final   Special Requests NONE  Final   Sputum evaluation   Final    THIS SPECIMEN IS ACCEPTABLE FOR SPUTUM CULTURE Performed at [facility], [place] [address], [place], [place] [address]  Report Status 09/20/2018 FINAL  Final         Radiology Studies: Dg Chest Portable 1 View  Result Date: 09/19/2018 CLINICAL DATA:  Dyspnea EXAM: PORTABLE CHEST 1 VIEW COMPARISON:  07/21/2018 chest radiograph. FINDINGS: Stable cardiomediastinal silhouette with normal heart size. No pneumothorax. No pleural effusion. Patchy bibasilar lung opacities are new. IMPRESSION: New patchy bibasilar lung opacities worrisome for multilobar pneumonia. Short-term chest radiograph follow-up advised. Electronically Signed   By: Ilona Sorrel M.D.   On: 09/19/2018 12:08        Scheduled Meds: . atorvastatin  20 mg Oral Daily  . Chlorhexidine Gluconate Cloth  6 each Topical Daily  . DULoxetine  60 mg Oral Daily  . feeding supplement (ENSURE ENLIVE)  237 mL Oral BID BM  . fluticasone  1 spray Each Nare Daily  . heparin injection (subcutaneous)  5,000 Units Subcutaneous Q8H  . levothyroxine  25 mcg Oral QAC breakfast  . loratadine  10 mg Oral Daily  . mouth rinse  15 mL Mouth Rinse BID  . mometasone-formoterol  2 puff Inhalation BID  . multivitamin with minerals  1 tablet Oral Daily  . pantoprazole  40 mg Oral Daily  . umeclidinium bromide  1 puff Inhalation Daily   Continuous Infusions: . sodium chloride 75 mL/hr at 09/20/18 1000  . azithromycin Stopped (09/19/18 1937)  . cefTRIAXone (ROCEPHIN)  IV Stopped (09/19/18 2132)  . vancomycin       LOS: 1 day    Time spent: 29 minutes     Gurley Climer J [place] DO [facility] Pager [phone]  If 7PM-7AM, please contact night-coverage [email or web address] Password [email or web address] 09/20/2018, 12:10 PM

## 2018-09-20 NOTE — Consult Note (Signed)
NAME:  CHARMAYNE ODELL, MRN:  [id number], DOB:  1951/05/16, LOS: 1 ADMISSION DATE:  09/19/2018, CONSULTATION DATE:  09/19/2018 REFERRING MD: Dr. Barton Dubois, CHIEF COMPLAINT: Respiratory failure  Brief History   Patient presented to the hospital with shortness of breath, cough fever general malaise Her presented to an outside hospital where she was evaluated and transferred here for further evaluation and management Low risk CO VID-19  Has had worsening shortness of breath cough and fever for about 4 days Generalized malaise Runny nose Denies any chest pains or chest discomfort  Has a history of COPD Quit smoking about 4 years ago  History of present illness   Shortness of breath cough fever for about 4 days Fever of 103 at the emergency department Multilobar pneumonia  Past Medical History   Past Medical History:  Diagnosis Date  . Chronic pain   . COPD (chronic obstructive pulmonary disease) ([place])    told has copd, no current inhaler use  . Cough   . Depression   . GERD (gastroesophageal reflux disease)   . Hypertension   . Hypothyroidism   . Insomnia   . Migraine   . Migraine   . Osteopenia   . Pain management   . Panic attacks   . Small bowel obstruction ([place])    Significant Hospital Events   T-max 102.6  Consults:  pccm 09/20/2018  Procedures:    Significant Diagnostic Tests:  Bibasal infiltrate on chest x-ray  Micro Data:  Expectorated sputum sent 09/20/2018 Blood culture 09/19/2018-pending MRSA by PCR negative on 09/19/2018  Respiratory viral panel did reveal coronavirus nl 63  Antimicrobials:  Azithromycin 09/19/2018>> Rocephin 09/19/2018>>  Interim history/subjective:  Feels a little bit better  Objective   Blood pressure 134/60, pulse 78, temperature 98.1 F (36.7 C), temperature source Oral, resp. rate 18, height 5\' 1"  (1.549 m), weight 51.5 kg, SpO2 96 %.        Intake/Output Summary (Last 24 hours) at 09/20/2018 1334 Last data filed  at 09/20/2018 1000 Gross per 24 hour  Intake 1740.08 ml  Output 650 ml  Net 1090.08 ml   Filed Weights   09/19/18 1300 09/19/18 1832  Weight: 46.7 kg 51.5 kg    Examination: General: Middle-aged lady does not appear to be an extremis, does look frail HENT: Dry oral mucosa Lungs: Decreased air movement bilaterally, few rales at the bases Cardiovascular: S1-S2 appreciated Abdomen: Bowel sounds appreciated Extremities: No clubbing, no edema Neuro: Alert and oriented x3, moving all extremities GU: Fair output  Resolved Hospital Problem list     Assessment & Plan:  Bibasal infiltrate on chest x-ray multifocal pneumonia -Continue current antibiotics -Likely related to community-acquired pneumonia -Viral pneumonia is a possibility especially with viral panel showing coronavirus--not COVID19  Depression -Continue duloxetine Chronic pain syndrome - Hypothyroidism -Synthroid  Anxiety   Acute respiratory failure -Continue oxygen supplementation -Continue bronchodilators -Oxygen requirement still quite significant at present -Combination of underlying lung disease and multilobar pneumonia  Chronic obstructive pulmonary disease with exacerbation -Continue bronchodilators  Mild to moderate disease of altered blood  We will leave patient off steroids at present  Best practice:  Diet: Heart healthy Pain/Anxiety/Delirium protocol (if indicated): Tylenol VAP protocol (if indicated): Not indicated DVT prophylaxis: Heparin GI prophylaxis: Protonix Glucose control:  Mobility: Bedrest Code Status: Full code Family Communication: No family at bedside Disposition:   Labs   CBC: Recent Labs  Lab 09/19/18 1129 09/20/18 0516  WBC 7.6 11.4*  NEUTROABS 7.2  --  HGB 10.3* 10.5*  HCT 32.1* 33.8*  MCV 88.2 90.6  PLT 235 938    Basic Metabolic Panel: Recent Labs  Lab 09/19/18 1129 09/19/18 1517 09/20/18 0516  NA 136  --  135  K 3.2*  --  4.2  CL 102  --  104  CO2  25  --  24  GLUCOSE 136*  --  90  BUN 20  --  11  CREATININE 0.90  --  0.76  CALCIUM 8.1*  --  8.0*  MG  --  1.6*  --   PHOS  --  2.8  --    GFR: Estimated Creatinine Clearance: 49.4 mL/min (by C-G formula based on SCr of 0.76 mg/dL). Recent Labs  Lab 09/19/18 1129 09/19/18 1517 09/20/18 0516  PROCALCITON  --  4.90  --   WBC 7.6  --  11.4*  LATICACIDVEN 1.7 1.3  --     Liver Function Tests: Recent Labs  Lab 09/19/18 1129  AST 29  ALT 14  ALKPHOS 70  BILITOT 0.3  PROT 5.8*  ALBUMIN 3.2*   No results for input(s): LIPASE, AMYLASE in the last 168 hours. No results for input(s): AMMONIA in the last 168 hours.  ABG No results found for: PHART, PCO2ART, PO2ART, HCO3, TCO2, ACIDBASEDEF, O2SAT   Coagulation Profile: Recent Labs  Lab 09/19/18 1129  INR 0.9    Cardiac Enzymes: No results for input(s): CKTOTAL, CKMB, CKMBINDEX, TROPONINI in the last 168 hours.  HbA1C: Hgb A1c MFr Bld  Date/Time Value Ref Range Status  04/05/2016 11:55 AM 5.7 (H) 4.8 - 5.6 % Final    Comment:    (NOTE)         Pre-diabetes: 5.7 - 6.4         Diabetes: >6.4         Glycemic control for adults with diabetes: <7.0   11/30/2015 08:51 AM 5.8 (H) 4.8 - 5.6 % Final    Comment:    (NOTE)         Pre-diabetes: 5.7 - 6.4         Diabetes: >6.4         Glycemic control for adults with diabetes: <7.0     CBG: No results for input(s): GLUCAP in the last 168 hours.  Review of Systems:   Review of Systems  Constitutional: Positive for malaise/fatigue.  Respiratory: Positive for cough, hemoptysis and shortness of breath.   All other systems reviewed and are negative.  Past Medical History  She,  has a past medical history of Chronic pain, COPD (chronic obstructive pulmonary disease) (HCC), Cough, Depression, GERD (gastroesophageal reflux disease), Hypertension, Hypothyroidism, Insomnia, Migraine, Migraine, Osteopenia, Pain management, Panic attacks, and Small bowel obstruction ([place]).    Surgical History    Past Surgical History:  Procedure Laterality Date  . ABDOMINAL HYSTERECTOMY    . COLON SURGERY    . COLOSTOMY CLOSURE  04/19/2012   Procedure: COLOSTOMY CLOSURE;  Surgeon: Adin Hector, MD;  Location: [facility];  Service: General;  Laterality: N/A;  Laparotomy, Resection and Closure of Colostomy  . COLOSTOMY TAKEDOWN N/A 04/12/2016   Procedure: [place] TAKEDOWN;  Surgeon: Johnathan Hausen, MD;  Location: [facility];  Service: General;  Laterality: N/A;  . INCONTINENCE SURGERY    . LAPAROTOMY  10/04/2011, colostomy also   Procedure: EXPLORATORY LAPAROTOMY;  Surgeon: Adin Hector, MD;  Location: [facility];  Service: General;  Laterality: N/A;  left partial colectomy with colostomy  . LAPAROTOMY  04/19/2012  Procedure: EXPLORATORY LAPAROTOMY;  Surgeon: Adin Hector, MD;  Location: [facility];  Service: General;  Laterality: N/A;  . LAPAROTOMY N/A 11/29/2015   Procedure: EXPLORATORY LAPAROTOMY; SUBTOTAL COLECTOMY WITH HARTMAN PROCEDURE AND END COLOSTOMY;  Surgeon: Johnathan Hausen, MD;  Location: [facility];  Service: General;  Laterality: N/A;  . TUBAL LIGATION    . VENTRAL HERNIA REPAIR  04/19/2012   Procedure: HERNIA REPAIR VENTRAL ADULT;  Surgeon: Adin Hector, MD;  Location: [facility];  Service: General;  Laterality: N/A;     Social History   reports that she quit smoking about 19 years ago. Her smoking use included cigarettes. She has a 30.00 pack-year smoking history. She has never used smokeless tobacco. She reports that she does not drink alcohol or use drugs.   Family History   Her family history includes COPD in her brother and sister; Cancer in her sister, sister and another family member; Diabetes in her brother and brother; Emphysema in her father; Heart disease in her brother, father, and mother; Hypertension in her brother, brother, father, and mother; Post-traumatic stress disorder in her brother; Stroke in her father; Thyroid disease in her brother, brother,  brother, brother, brother, sister, sister, and sister; Transient ischemic attack in her brother.   Allergies Allergies  Allergen Reactions  . Toradol [Ketorolac Tromethamine] Shortness Of Breath  . Butalbital-Apap-Caffeine Other (See Comments)    jittery  . Demerol Swelling  . Esgic [Butalbital-Apap-Caffeine] Other (See Comments)    jittery  . Iodine Other (See Comments)    bp bottomed out per pt several hours later; unsure if pre medicated in past with cm; done in [place] Medications  Prior to Admission medications   Medication Sig Start Date End Date Taking? Authorizing Provider   Critical care time: Critically ill at the present time with significant oxygen requirement Critical care time spent evaluating patient, reviewing records, formulating plan of care is 30 minutes

## 2018-09-20 NOTE — Progress Notes (Signed)
OT Cancellation Note  Patient Details Name: Stacy Dalton MRN: [id number] DOB: 1950/12/09   Cancelled Treatment:    Reason Eval/Treat Not Completed: Other (comment) Per health system leadership, therapy services are being held at this time until patient tests negative for COVID-19.    Fisher Hargadon 09/20/2018, 7:03 AM  Lesle Chris, OTR/L Acute Rehabilitation Services [phone] [facility] pager [phone] office 09/20/2018

## 2018-09-20 NOTE — Progress Notes (Signed)
Pharmacy Antibiotic Note  Stacy Dalton is a 68 y.o. female admitted on 09/19/2018 with pneumonia.  Pharmacy has been consulted for vancomycin dosing.  Plan:  Vancomycin 1000 mg IV x1, then 750 mg IV q24h    Monitor clinical course, renal function, cultures as available    Height: 5\' 1"  (154.9 cm) Weight: 113 lb 8.6 oz (51.5 kg) IBW/kg (Calculated) : 47.8  Temp (24hrs), Avg:101.1 F (38.4 C), Min:99.3 F (37.4 C), Max:103.5 F (39.7 C)  Recent Labs  Lab 09/19/18 1129 09/19/18 1517  WBC 7.6  --   CREATININE 0.90  --   LATICACIDVEN 1.7 1.3    Estimated Creatinine Clearance: 43.9 mL/min (by C-G formula based on SCr of 0.9 mg/dL).    Allergies  Allergen Reactions  . Toradol [Ketorolac Tromethamine] Shortness Of Breath  . Butalbital-Apap-Caffeine Other (See Comments)    jittery  . Demerol Swelling  . Esgic [Butalbital-Apap-Caffeine] Other (See Comments)    jittery  . Iodine Other (See Comments)    bp bottomed out per pt several hours later; unsure if pre medicated in past with cm; done in [place]    Antimicrobials this admission: 3/19 Zosyn x1 3/19 vancomycin >>   Dose adjustments this admission:    Microbiology results: 3/19 BCx:  3/19 COVID-19:  3/19 HIV antibody:   3/19 Sputum:   3/20 MRSA PCR: negative  Thank you for allowing pharmacy to be a part of this patient's care.    Royetta Asal, PharmD, BCPS Pager [phone] 09/20/2018 2:06 AM

## 2018-09-21 LAB — CBC WITH DIFFERENTIAL/PLATELET
Abs Immature Granulocytes: 0.09 10*3/uL — ABNORMAL HIGH (ref 0.00–0.07)
Basophils Absolute: 0 10*3/uL (ref 0.0–0.1)
Basophils Relative: 0 %
Eosinophils Absolute: 0.3 10*3/uL (ref 0.0–0.5)
Eosinophils Relative: 3 %
HCT: 31 % — ABNORMAL LOW (ref 36.0–46.0)
Hemoglobin: 9.4 g/dL — ABNORMAL LOW (ref 12.0–15.0)
Immature Granulocytes: 1 %
Lymphocytes Relative: 9 %
Lymphs Abs: 1 10*3/uL (ref 0.7–4.0)
MCH: 27.2 pg (ref 26.0–34.0)
MCHC: 30.3 g/dL (ref 30.0–36.0)
MCV: 89.9 fL (ref 80.0–100.0)
Monocytes Absolute: 0.5 10*3/uL (ref 0.1–1.0)
Monocytes Relative: 5 %
Neutro Abs: 8.6 10*3/uL — ABNORMAL HIGH (ref 1.7–7.7)
Neutrophils Relative %: 82 %
Platelets: 192 10*3/uL (ref 150–400)
RBC: 3.45 MIL/uL — ABNORMAL LOW (ref 3.87–5.11)
RDW: 13.9 % (ref 11.5–15.5)
WBC: 10.5 10*3/uL (ref 4.0–10.5)
nRBC: 0 % (ref 0.0–0.2)

## 2018-09-21 LAB — COMPREHENSIVE METABOLIC PANEL WITH GFR
ALT: 10 U/L (ref 0–44)
AST: 18 U/L (ref 15–41)
Albumin: 2.9 g/dL — ABNORMAL LOW (ref 3.5–5.0)
Alkaline Phosphatase: 70 U/L (ref 38–126)
Anion gap: 8 (ref 5–15)
BUN: 9 mg/dL (ref 8–23)
CO2: 26 mmol/L (ref 22–32)
Calcium: 8.4 mg/dL — ABNORMAL LOW (ref 8.9–10.3)
Chloride: 101 mmol/L (ref 98–111)
Creatinine, Ser: 0.68 mg/dL (ref 0.44–1.00)
GFR calc Af Amer: >60 mL/min
GFR calc non Af Amer: >60 mL/min
Glucose, Bld: 108 mg/dL — ABNORMAL HIGH (ref 70–99)
Potassium: 3.2 mmol/L — ABNORMAL LOW (ref 3.5–5.1)
Sodium: 135 mmol/L (ref 135–145)
Total Bilirubin: 0.6 mg/dL (ref 0.3–1.2)
Total Protein: 5.9 g/dL — ABNORMAL LOW (ref 6.5–8.1)

## 2018-09-21 LAB — LEGIONELLA PNEUMOPHILA SEROGP 1 UR AG: L. pneumophila Serogp 1 Ur Ag: NEGATIVE

## 2018-09-21 LAB — PROCALCITONIN: Procalcitonin: 4.92 ng/mL

## 2018-09-21 LAB — MAGNESIUM: Magnesium: 1.8 mg/dL (ref 1.7–2.4)

## 2018-09-21 MED ORDER — POTASSIUM CHLORIDE CRYS ER 20 MEQ PO TBCR
40.0000 meq | EXTENDED_RELEASE_TABLET | ORAL | Status: AC
  Administered 2018-09-21 (×2): 40 meq via ORAL
  Filled 2018-09-21 (×2): qty 2

## 2018-09-21 MED ORDER — OXYCODONE HCL 5 MG PO TABS
10.0000 mg | ORAL_TABLET | ORAL | Status: DC | PRN
  Administered 2018-09-21 – 2018-09-24 (×16): 10 mg via ORAL
  Filled 2018-09-21 (×16): qty 2

## 2018-09-21 MED ORDER — AMLODIPINE BESYLATE 10 MG PO TABS
10.0000 mg | ORAL_TABLET | Freq: Every day | ORAL | Status: DC
  Administered 2018-09-21 – 2018-09-24 (×4): 10 mg via ORAL
  Filled 2018-09-21 (×4): qty 1

## 2018-09-21 NOTE — Progress Notes (Signed)
OT Cancellation Note  Patient Details Name: Stacy Dalton MRN: [id number] DOB: 1950/09/01   Cancelled Treatment:    Reason Eval/Treat Not Completed: Other (comment) Per health system leadership, therapy services are being held at this time until patient tests negative for COVID-19.     Pauline Aus OTR/L Acute Rehabilitation [phone] office number     09/21/2018, 8:39 AM

## 2018-09-21 NOTE — Progress Notes (Signed)
NAME:  Stacy Dalton, MRN:  [id number], DOB:  [birth year], LOS: 2 ADMISSION DATE:  09/19/2018, CONSULTATION DATE: 09/19/2018 REFERRING MD: Barton Dubois, CHIEF COMPLAINT: Respiratory failure  Brief History    Patient presented to the hospital with shortness of breath, cough fever general malaise Her presented to an outside hospital where she was evaluated and transferred here for further evaluation and management Low risk CO VID-19  Has had worsening shortness of breath cough and fever for about 4 days Generalized malaise Runny nose Denies any chest pains or chest discomfort  Has a history of COPD Quit smoking about 4 years ago  History of present illness   Shortness of breath cough fever for about 4 days Fever of 103 at the emergency department Multilobar pneumonia  Past Medical History   Past Medical History:  Diagnosis Date  . Chronic pain   . COPD (chronic obstructive pulmonary disease) ([place])    told has copd, no current inhaler use  . Cough   . Depression   . GERD (gastroesophageal reflux disease)   . Hypertension   . Hypothyroidism   . Insomnia   . Migraine   . Migraine   . Osteopenia   . Pain management   . Panic attacks   . Small bowel obstruction ([place])    Significant Hospital Events   Fever is resolving  Consults:  PCCM 09/20/2018  Procedures:    Significant Diagnostic Tests:  Bibasal infiltrate on chest x-ray  Micro Data:  Expectorated sputum 09/20/2018 Blood culture 09/19/2018-negative to date MRSA by PCR negative on 09/19/2018 Strep pneumo antigen positive in urine Respiratory viral panel did reveal coronavirus NL 63  Antimicrobials:  Azithromycin 09/19/2018>> Rocephin 09/19/2018>>  Interim history/subjective:  Feels generally better  Objective   Blood pressure (!) 140/96, pulse 81, temperature 98.5 F (36.9 C), temperature source Oral, resp. rate 17, height 5\' 1"  (1.549 m), weight 51.5 kg, SpO2 96 %.        Intake/Output Summary  (Last 24 hours) at 09/21/2018 0959 Last data filed at 09/21/2018 0900 Gross per 24 hour  Intake 3276.11 ml  Output 1200 ml  Net 2076.11 ml   Filed Weights   09/19/18 1300 09/19/18 1832  Weight: 46.7 kg 51.5 kg    Examination: General: Middle-age lady, chronically ill looking HENT: Dry oral mucosa Lungs: Decreased breath sounds bilaterally, few rales at the bases Cardiovascular: S1-S2 appreciated Abdomen: Bowel sounds appreciated Extremities: No clubbing, no edema Neuro: Alert and oriented x3, moving all extremities GU: Fair output  Resolved Hospital Problem list     Assessment & Plan:  Multilobar pneumonia -Continue current antibiotics -Viral pneumonia -Strep pneumonia  Hemoptysis -Small amount of altered blood -No bright red blood  -Relating to respiratory infection  Depression -Continue duloxetine -Chronic pain syndrome  Hypothyroidism -Synthroid  Anxiety  Acute respiratory failure -Continue oxygen supplementation -Continue bronchodilators -Continue oxygen supplementation, wean and maintain saturations greater than 90% -Likely secondary to underlying lung disease or multilobar pneumonia  Chronic obstructive pulmonary disease with exacerbation -Continue bronchodilators  Not bronchospastic, leave off steroids  COVID testing pending results  Best practice:  Diet: Heart healthy Pain/Anxiety/Delirium protocol (if indicated): Tylenol VAP protocol (if indicated): Not indicated DVT prophylaxis: Heparin GI prophylaxis: Protonix Glucose control:  Mobility: Bedrest Code Status: Full code Family Communication: No family at bedside Disposition: Continue ICU management  Labs   CBC: Recent Labs  Lab 09/19/18 1129 09/20/18 0516 09/21/18 0710  WBC 7.6 11.4* 10.5  NEUTROABS 7.2  --  8.6*  HGB 10.3* 10.5* 9.4*  HCT 32.1* 33.8* 31.0*  MCV 88.2 90.6 89.9  PLT 235 178 350    Basic Metabolic Panel: Recent Labs  Lab 09/19/18 1129 09/19/18 1517  09/20/18 0516 09/21/18 0710  NA 136  --  135 135  K 3.2*  --  4.2 3.2*  CL 102  --  104 101  CO2 25  --  24 26  GLUCOSE 136*  --  90 108*  BUN 20  --  11 9  CREATININE 0.90  --  0.76 0.68  CALCIUM 8.1*  --  8.0* 8.4*  MG  --  1.6*  --  1.8  PHOS  --  2.8  --   --    GFR: Estimated Creatinine Clearance: 49.4 mL/min (by C-G formula based on SCr of 0.68 mg/dL). Recent Labs  Lab 09/19/18 1129 09/19/18 1517 09/20/18 0516 09/21/18 0710  PROCALCITON  --  4.90  --  4.92  WBC 7.6  --  11.4* 10.5  LATICACIDVEN 1.7 1.3  --   --     Liver Function Tests: Recent Labs  Lab 09/19/18 1129 09/21/18 0710  AST 29 18  ALT 14 10  ALKPHOS 70 70  BILITOT 0.3 0.6  PROT 5.8* 5.9*  ALBUMIN 3.2* 2.9*   No results for input(s): LIPASE, AMYLASE in the last 168 hours. No results for input(s): AMMONIA in the last 168 hours.  ABG No results found for: PHART, PCO2ART, PO2ART, HCO3, TCO2, ACIDBASEDEF, O2SAT   Coagulation Profile: Recent Labs  Lab 09/19/18 1129  INR 0.9    Cardiac Enzymes: No results for input(s): CKTOTAL, CKMB, CKMBINDEX, TROPONINI in the last 168 hours.  HbA1C: Hgb A1c MFr Bld  Date/Time Value Ref Range Status  04/05/2016 11:55 AM 5.7 (H) 4.8 - 5.6 % Final    Comment:    (NOTE)         Pre-diabetes: 5.7 - 6.4         Diabetes: >6.4         Glycemic control for adults with diabetes: <7.0   11/30/2015 08:51 AM 5.8 (H) 4.8 - 5.6 % Final    Comment:    (NOTE)         Pre-diabetes: 5.7 - 6.4         Diabetes: >6.4         Glycemic control for adults with diabetes: <7.0     CBG: No results for input(s): GLUCAP in the last 168 hours.  Review of Systems:   Review of Systems  Constitutional: Positive for malaise/fatigue.  HENT: Negative.  Negative for hearing loss.   Respiratory: Positive for cough, hemoptysis and shortness of breath.   Skin: Negative.      Past Medical History  She,  has a past medical history of Chronic pain, COPD (chronic obstructive  pulmonary disease) (HCC), Cough, Depression, GERD (gastroesophageal reflux disease), Hypertension, Hypothyroidism, Insomnia, Migraine, Migraine, Osteopenia, Pain management, Panic attacks, and Small bowel obstruction ([place]).   Surgical History    Past Surgical History:  Procedure Laterality Date  . ABDOMINAL HYSTERECTOMY    . COLON SURGERY    . COLOSTOMY CLOSURE  04/19/2012   Procedure: COLOSTOMY CLOSURE;  Surgeon: Adin Hector, MD;  Location: [facility];  Service: General;  Laterality: N/A;  Laparotomy, Resection and Closure of Colostomy  . COLOSTOMY TAKEDOWN N/A 04/12/2016   Procedure: [place] TAKEDOWN;  Surgeon: Johnathan Hausen, MD;  Location: [facility];  Service: General;  Laterality: N/A;  . INCONTINENCE SURGERY    .  LAPAROTOMY  10/04/2011, colostomy also   Procedure: EXPLORATORY LAPAROTOMY;  Surgeon: Adin Hector, MD;  Location: [facility];  Service: General;  Laterality: N/A;  left partial colectomy with colostomy  . LAPAROTOMY  04/19/2012   Procedure: EXPLORATORY LAPAROTOMY;  Surgeon: Adin Hector, MD;  Location: [facility];  Service: General;  Laterality: N/A;  . LAPAROTOMY N/A 11/29/2015   Procedure: EXPLORATORY LAPAROTOMY; SUBTOTAL COLECTOMY WITH HARTMAN PROCEDURE AND END COLOSTOMY;  Surgeon: Johnathan Hausen, MD;  Location: [facility];  Service: General;  Laterality: N/A;  . TUBAL LIGATION    . VENTRAL HERNIA REPAIR  04/19/2012   Procedure: HERNIA REPAIR VENTRAL ADULT;  Surgeon: Adin Hector, MD;  Location: [facility];  Service: General;  Laterality: N/A;     Social History   reports that she quit smoking about 19 years ago. Her smoking use included cigarettes. She has a 30.00 pack-year smoking history. She has never used smokeless tobacco. She reports that she does not drink alcohol or use drugs.   Family History   Her family history includes COPD in her brother and sister; Cancer in her sister, sister and another family member; Diabetes in her brother and brother; Emphysema in her father;  Heart disease in her brother, father, and mother; Hypertension in her brother, brother, father, and mother; Post-traumatic stress disorder in her brother; Stroke in her father; Thyroid disease in her brother, brother, brother, brother, brother, sister, sister, and sister; Transient ischemic attack in her brother.   Allergies Allergies  Allergen Reactions  . Toradol [Ketorolac Tromethamine] Shortness Of Breath  . Butalbital-Apap-Caffeine Other (See Comments)    jittery  . Demerol Swelling  . Esgic [Butalbital-Apap-Caffeine] Other (See Comments)    jittery  . Iodine Other (See Comments)    bp bottomed out per pt several hours later; unsure if pre medicated in past with cm; done in [place]      Critical care time: 30 minutes of critical care time spent evaluating patient, reviewing records, formulating plan of care

## 2018-09-21 NOTE — Progress Notes (Signed)
PT Cancellation Note  Patient Details Name: Stacy Dalton MRN: [id number] DOB: 1950-09-19   Cancelled Treatment:      Per health system leadership, therapy services are being held at this time until patient tests negative for COVID-19.      [name] 09/21/2018, 8:33 AM [place] Pager [phone] Office [phone]

## 2018-09-21 NOTE — Progress Notes (Signed)
PROGRESS NOTE    Stacy Dalton  ZOX:[id number] DOB: February 10, 1951 DOA: 09/19/2018 PCP: [name], FNP    Brief Narrative:   ALAJIAH Stacy Dalton is a 68 y.o. female with a past medical history significant for COPD, chronic pain, depression and/anxiety, gastroesophageal reflux disease, hypertension, hypothyroidism and and chronic pain syndrome; who presented to the emergency department secondary to worsening shortness of breath, cough and fever.  Symptoms have been present for the last 4 days prior to admission and worsening.  Stacy Dalton reported associated headaches and general malaise.  There has been runny nose and decreased appetite/oral intake.  Stacy Dalton denies chest pain, nausea, vomiting, diarrhea, abdominal pain, dysuria, hemoptysis, hematemesis, melena, hematochezia, focal weakness or any other complaints. Stacy Dalton reports been in contact with her grandkids and caring for them since the schools are closed; they have had URI symptoms.  No other sick contacts that she is aware, no recent travels.  In the ED Stacy Dalton was found with increased requirement for oxygen supplementation (O2 sat on room air in the low 80s), increased respiratory rate, tachycardic, febrile up to 103 and with lymphopenia on her CBC with differential.  Chest x-ray demonstrated multilobar pneumonia.  Fluid resuscitation given as per sepsis protocol, started on IV antibiotics; influenza by PCR negative, RVP and COVID-19 swabs has been taken.   Assessment & Plan:   Principal Problem:   Multifocal pneumonia Active Problems:   Hypertension   HLD (hyperlipidemia)   Depression   Chronic pain syndrome   Hypothyroidism   Anxiety   GERD (gastroesophageal reflux disease)   Sepsis (HCC)   Acute respiratory failure with hypoxia (HCC)   Sepsis, present on admission Acute hypoxic respiratory failure Multifocal pneumonia Strep pneumonia/viral pneumonia Stacy Dalton presenting as a transfer [facility] with  progressive fever, cough and dyspnea over the past 4 days.  Stacy Dalton without any recent travel but has been watching her grandchildren over the past 4 days as they have been out of school with a sick contact with upper respiratory symptoms.  Stacy Dalton comorbidities include COPD and hypertension.  Stacy Dalton was febrile up to 103.5 with a white blood cell count 11.4 and lymphopenia.  LFTs within normal limits, MRSA PCR negative, rapid flu PCR negative.  LDH elevated at 120 with a normal ferritin.  CRP elevated to 5.1 with a procalcitonin of 4.90.  Respiratory viral panel positive for coronavirus NL 63.  Chest x-ray notable for patchy bibasilar opacities consistent with multilobar pneumonia.  Initially was on room air but quickly desaturated requiring high flow nasal cannula at outside hospital. --PCCM and Infectious disease following; appreciate assistance --COVID-19 pending --Blood cultures show no growth x2 days  --Sputum culture moderate gram-positive rods --Urine strep pneumo antigen positive --Continue vancomycin, azithromycin, ceftriaxone --Holding off steroid at this point --Continue airborne isolation awaiting results of COVID-19 --Continue to titrate supplemental oxygen to maintain SPO2 greater than 90% --Supportive care  HLD  --Continue statin  Hypothyroidism TSH 1.372, wnl --Continue Synthroid 43mcg PO daily  Depression and anxiety --Continue home duloxetine 60 mg p.o. daily  Chronic pain syndrome --Oxycodone 10 mg every 4 hours prn  GERD  --Continue PPI    Essential hypertension --continue amlodipine  Hypokalemia Etiology likely albuterol usage and decreased oral intake --K 3.2 and Mag 1.8 today; will replete --Continue to monitor daily electrolytes daily with magnesium  DVT prophylaxis: Heparin Code Status: Full code Family Communication: None Disposition Plan: Continue current level care, SDU, airborne isolation   Consultants:   PCCM  Infectious  Disease   Procedures:   none  Antimicrobials:   Vancomycin 3/19>>>  Azithromycin 3/19>>>  Ceftriaxone 3/19>>>   Subjective: Stacy Dalton seen and examined at bedside, lying comfortably in bed.  Complains of persistence of chronic low back pain not controlled with Norco.  Breathing improved, now titrated down to 2 L nasal cannula.  No other complaints at this time.  Denies headache, no visual changes, no chest pain, no palpitations, no abdominal pain, no issues with bowel/bladder function, no paresthesias.  No acute concerns overnight per nursing staff.  Objective: Vitals:   09/21/18 0900 09/21/18 1000 09/21/18 1100 09/21/18 1112  BP: (!) 140/96 (!) 155/70 (!) 142/51   Pulse: 81 88 79   Resp: 17 18 20    Temp:    98.3 F (36.8 C)  TempSrc:    Oral  SpO2: 96% 95% 93%   Weight:      Height:        Intake/Output Summary (Last 24 hours) at 09/21/2018 1154 Last data filed at 09/21/2018 0900 Gross per 24 hour  Intake 2664.09 ml  Output 550 ml  Net 2114.09 ml   Filed Weights   09/19/18 1300 09/19/18 1832  Weight: 46.7 kg 51.5 kg    Examination:  General exam: No acute distress, mild discomfort, appears ill Respiratory system: Breath sounds slightly decreased bilateral bases, mild late expiratory wheezing, normal respiratory effort, on 2 L nasal cannula Cardiovascular system: S1 & S2 heard, RRR. No JVD, murmurs, rubs, gallops or clicks. No pedal edema. Gastrointestinal system: Abdomen is nondistended, soft and nontender. No organomegaly or masses felt. Normal bowel sounds heard. Central nervous system: Alert and oriented. No focal neurological deficits. Extremities: Symmetric 5 x 5 power. Skin: No rashes, lesions or ulcers Psychiatry: Judgement and insight appear normal. Mood & affect appropriate.     Data Reviewed: I have personally reviewed following labs and imaging studies  CBC: Recent Labs  Lab 09/19/18 1129 09/20/18 0516 09/21/18 0710  WBC 7.6 11.4* 10.5  NEUTROABS 7.2  --   8.6*  HGB 10.3* 10.5* 9.4*  HCT 32.1* 33.8* 31.0*  MCV 88.2 90.6 89.9  PLT 235 178 009   Basic Metabolic Panel: Recent Labs  Lab 09/19/18 1129 09/19/18 1517 09/20/18 0516 09/21/18 0710  NA 136  --  135 135  K 3.2*  --  4.2 3.2*  CL 102  --  104 101  CO2 25  --  24 26  GLUCOSE 136*  --  90 108*  BUN 20  --  11 9  CREATININE 0.90  --  0.76 0.68  CALCIUM 8.1*  --  8.0* 8.4*  MG  --  1.6*  --  1.8  PHOS  --  2.8  --   --    GFR: Estimated Creatinine Clearance: 49.4 mL/min (by C-G formula based on SCr of 0.68 mg/dL). Liver Function Tests: Recent Labs  Lab 09/19/18 1129 09/21/18 0710  AST 29 18  ALT 14 10  ALKPHOS 70 70  BILITOT 0.3 0.6  PROT 5.8* 5.9*  ALBUMIN 3.2* 2.9*   No results for input(s): LIPASE, AMYLASE in the last 168 hours. No results for input(s): AMMONIA in the last 168 hours. Coagulation Profile: Recent Labs  Lab 09/19/18 1129  INR 0.9   Cardiac Enzymes: No results for input(s): CKTOTAL, CKMB, CKMBINDEX, TROPONINI in the last 168 hours. BNP (last 3 results) No results for input(s): PROBNP in the last 8760 hours. HbA1C: No results for input(s): HGBA1C in the last 72 hours. CBG:  No results for input(s): GLUCAP in the last 168 hours. Lipid Profile: No results for input(s): CHOL, HDL, LDLCALC, TRIG, CHOLHDL, LDLDIRECT in the last 72 hours. Thyroid Function Tests: Recent Labs    09/19/18 1517  TSH 1.372   Anemia Panel: Recent Labs    09/19/18 1517  FERRITIN 26   Sepsis Labs: Recent Labs  Lab 09/19/18 1129 09/19/18 1517 09/21/18 0710  PROCALCITON  --  4.90 4.92  LATICACIDVEN 1.7 1.3  --     Recent Results (from the past 240 hour(s))  Culture, blood (Routine x 2)     Status: None (Preliminary result)   Collection Time: 09/19/18 11:29 AM  Result Value Ref Range Status   Specimen Description BLOOD RIGHT WRIST  Final   Special Requests   Final    BOTTLES DRAWN AEROBIC AND ANAEROBIC Blood Culture adequate volume   Culture   Final     NO GROWTH 2 DAYS Performed at [facility], [address], [place], [place] [address]    Report Status PENDING  Incomplete  Respiratory Panel by PCR     Status: Abnormal   Collection Time: 09/19/18 11:41 AM  Result Value Ref Range Status   Adenovirus NOT DETECTED NOT DETECTED Final   Coronavirus 229E NOT DETECTED NOT DETECTED Final    Comment: (NOTE) The Coronavirus on the Respiratory Panel, DOES NOT test for the novel  Coronavirus (2019 nCoV)    Coronavirus HKU1 NOT DETECTED NOT DETECTED Final   Coronavirus NL63 DETECTED (A) NOT DETECTED Final   Coronavirus OC43 NOT DETECTED NOT DETECTED Final   Metapneumovirus NOT DETECTED NOT DETECTED Final   Rhinovirus / Enterovirus NOT DETECTED NOT DETECTED Final   Influenza A NOT DETECTED NOT DETECTED Final   Influenza B NOT DETECTED NOT DETECTED Final   Parainfluenza Virus 1 NOT DETECTED NOT DETECTED Final   Parainfluenza Virus 2 NOT DETECTED NOT DETECTED Final   Parainfluenza Virus 3 NOT DETECTED NOT DETECTED Final   Parainfluenza Virus 4 NOT DETECTED NOT DETECTED Final   Respiratory Syncytial Virus NOT DETECTED NOT DETECTED Final   Bordetella pertussis NOT DETECTED NOT DETECTED Final   Chlamydophila pneumoniae NOT DETECTED NOT DETECTED Final   Mycoplasma pneumoniae NOT DETECTED NOT DETECTED Final    Comment: Performed at [facility], [place] [address] [place], [place] [address]  Culture, blood (Routine x 2)     Status: None (Preliminary result)   Collection Time: 09/19/18 12:38 PM  Result Value Ref Range Status   Specimen Description BLOOD LEFT HAND  Final   Special Requests   Final    BOTTLES DRAWN AEROBIC AND ANAEROBIC Blood Culture adequate volume   Culture   Final    NO GROWTH 2 DAYS Performed at [facility], [address] [place], [place] [address]    Report Status PENDING  Incomplete  MRSA PCR Screening     Status: None   Collection Time: 09/19/18  7:07 PM  Result Value Ref Range Status   MRSA by PCR NEGATIVE NEGATIVE  Final    Comment:        The GeneXpert MRSA Assay (FDA approved for NASAL specimens only), is one component of a comprehensive MRSA colonization surveillance program. It is not intended to diagnose MRSA infection nor to guide or monitor treatment for MRSA infections. Performed at [facility], [place] [address], [place], [place] [address]   Expectorated sputum assessment w rflx to resp cult     Status: None   Collection  Time: 09/20/18  9:56 AM  Result Value Ref Range Status   Specimen Description SPUTUM  Final   Special Requests NONE  Final   Sputum evaluation   Final    THIS SPECIMEN IS ACCEPTABLE FOR SPUTUM CULTURE Performed at [facility], [place] [address], [place], [place] [address]    Report Status 09/20/2018 FINAL  Final  Culture, respiratory     Status: None (Preliminary result)   Collection Time: 09/20/18  9:56 AM  Result Value Ref Range Status   Specimen Description   Final    SPUTUM Performed at [place] [address] [place], [place] [address]    Special Requests   Final    NONE Reflexed from [id number] Performed at [facility], [place] [address], [place], [place] [address]    Gram Stain   Final    NO WBC SEEN FEW SQUAMOUS EPITHELIAL CELLS PRESENT MODERATE GRAM POSITIVE RODS    Culture   Final    CULTURE REINCUBATED FOR BETTER GROWTH Performed at [facility], [place] [address], [place], [place] [address]    Report Status PENDING  Incomplete         Radiology Studies: Dg Chest Portable 1 View  Result Date: 09/19/2018 CLINICAL DATA:  Dyspnea EXAM: PORTABLE CHEST 1 VIEW COMPARISON:  07/21/2018 chest radiograph. FINDINGS: Stable cardiomediastinal silhouette with normal heart size. No pneumothorax. No pleural effusion. Patchy bibasilar lung opacities are new. IMPRESSION: New patchy bibasilar lung opacities worrisome for multilobar pneumonia. Short-term chest radiograph follow-up  advised. Electronically Signed   By: Ilona Sorrel M.D.   On: 09/19/2018 12:08        Scheduled Meds: . atorvastatin  20 mg Oral Daily  . Chlorhexidine Gluconate Cloth  6 each Topical Daily  . DULoxetine  60 mg Oral Daily  . feeding supplement (ENSURE ENLIVE)  237 mL Oral BID BM  . fluticasone  1 spray Each Nare Daily  . heparin injection (subcutaneous)  5,000 Units Subcutaneous Q8H  . levothyroxine  25 mcg Oral QAC breakfast  . loratadine  10 mg Oral Daily  . mouth rinse  15 mL Mouth Rinse BID  . mometasone-formoterol  2 puff Inhalation BID  . multivitamin with minerals  1 tablet Oral Daily  . pantoprazole  40 mg Oral Daily  . umeclidinium bromide  1 puff Inhalation Daily   Continuous Infusions: . sodium chloride Stopped (09/21/18 1110)  . azithromycin Stopped (09/20/18 1754)  . cefTRIAXone (ROCEPHIN)  IV 1 g (09/20/18 1825)     LOS: 2 days    Time spent: 30 minutes    Eric J [place] DO [facility] Pager [phone]  If 7PM-7AM, please contact night-coverage [email or web address] Password [facility] 09/21/2018, 11:54 AM

## 2018-09-22 LAB — CULTURE, RESPIRATORY W GRAM STAIN
Culture: NORMAL
Gram Stain: NONE SEEN

## 2018-09-22 LAB — CBC
HCT: 34.6 % — ABNORMAL LOW (ref 36.0–46.0)
Hemoglobin: 10.8 g/dL — ABNORMAL LOW (ref 12.0–15.0)
MCH: 27.9 pg (ref 26.0–34.0)
MCHC: 31.2 g/dL (ref 30.0–36.0)
MCV: 89.4 fL (ref 80.0–100.0)
Platelets: 228 10*3/uL (ref 150–400)
RBC: 3.87 MIL/uL (ref 3.87–5.11)
RDW: 13.8 % (ref 11.5–15.5)
WBC: 9.6 10*3/uL (ref 4.0–10.5)
nRBC: 0 % (ref 0.0–0.2)

## 2018-09-22 LAB — COMPREHENSIVE METABOLIC PANEL
ALT: 11 U/L (ref 0–44)
AST: 15 U/L (ref 15–41)
Albumin: 3 g/dL — ABNORMAL LOW (ref 3.5–5.0)
Alkaline Phosphatase: 87 U/L (ref 38–126)
Anion gap: 11 (ref 5–15)
BUN: 7 mg/dL — ABNORMAL LOW (ref 8–23)
CO2: 25 mmol/L (ref 22–32)
Calcium: 8.9 mg/dL (ref 8.9–10.3)
Chloride: 101 mmol/L (ref 98–111)
Creatinine, Ser: 0.74 mg/dL (ref 0.44–1.00)
GFR calc Af Amer: >60 mL/min (ref >60)
GFR calc non Af Amer: >60 mL/min (ref >60)
Glucose, Bld: 99 mg/dL (ref 70–99)
Potassium: 3.8 mmol/L (ref 3.5–5.1)
Sodium: 137 mmol/L (ref 135–145)
Total Bilirubin: 0.6 mg/dL (ref 0.3–1.2)
Total Protein: 6.4 g/dL — ABNORMAL LOW (ref 6.5–8.1)

## 2018-09-22 LAB — MAGNESIUM: Magnesium: 1.8 mg/dL (ref 1.7–2.4)

## 2018-09-22 MED ORDER — SENNOSIDES-DOCUSATE SODIUM 8.6-50 MG PO TABS
1.0000 | ORAL_TABLET | Freq: Every evening | ORAL | Status: DC | PRN
  Administered 2018-09-23: 1 via ORAL
  Administered 2018-09-23: 2 via ORAL
  Filled 2018-09-22: qty 1
  Filled 2018-09-22: qty 2

## 2018-09-22 NOTE — Progress Notes (Signed)
PT Cancellation Note  Patient Details Name: Stacy Dalton MRN: [id number] DOB: [birth year]   Cancelled Treatment:    Reason Eval/Treat Not Completed: Medical issues which prohibited therapy Per health system leadership, therapy services are being held at this time until patient tests negative for COVID-19.     Weston Anna, PT Acute Rehabilitation Services Pager: [phone] Office: [phone]

## 2018-09-22 NOTE — Progress Notes (Signed)
NAME:  Stacy Dalton, MRN:  [id number], DOB:  18-Feb-1951, LOS: 3 ADMISSION DATE:  09/19/2018, CONSULTATION DATE: 09/19/2018 REFERRING MD: Barton Dubois, CHIEF COMPLAINT: Respiratory failure  Brief History    Patient presented to the hospital with shortness of breath, cough fever general malaise Her presented to an outside hospital where she was evaluated and transferred here for further evaluation and management Low risk CO VID-19  Has had worsening shortness of breath cough and fever for about 4 days Generalized malaise Runny nose Denies any chest pains or chest discomfort  Has a history of COPD Quit smoking about 4 years ago  History of present illness   Shortness of breath cough fever for about 4 days Fever of 103 at the emergency department Multilobar pneumonia  Past Medical History   Past Medical History:  Diagnosis Date  . Chronic pain   . COPD (chronic obstructive pulmonary disease) ([place])    told has copd, no current inhaler use  . Cough   . Depression   . GERD (gastroesophageal reflux disease)   . Hypertension   . Hypothyroidism   . Insomnia   . Migraine   . Migraine   . Osteopenia   . Pain management   . Panic attacks   . Small bowel obstruction ([place])    Significant Hospital Events   Fever is resolving  Consults:  PCCM 09/20/2018  Procedures:    Significant Diagnostic Tests:  Bibasal infiltrate on chest x-ray  Micro Data:  Expectorated sputum 09/20/2018 Blood culture 09/19/2018-negative to date MRSA by PCR negative on 09/19/2018 Strep pneumo antigen positive in urine Respiratory viral panel did reveal coronavirus NL 63  Antimicrobials:  Azithromycin 09/19/2018>> Rocephin 09/19/2018>>  Interim history/subjective:  Feels generally better Oxygen requirement is improving  Objective   Blood pressure (!) 166/75, pulse 78, temperature 98.3 F (36.8 C), temperature source Oral, resp. rate 14, height 5\' 1"  (1.549 m), weight 51.5 kg, SpO2 94 %.         Intake/Output Summary (Last 24 hours) at 09/22/2018 1103 Last data filed at 09/22/2018 0400 Gross per 24 hour  Intake 1190 ml  Output 3000 ml  Net -1810 ml   Filed Weights   09/19/18 1300 09/19/18 1832  Weight: 46.7 kg 51.5 kg    Examination: Elderly lady, chronically ill looking Dry oral mucosa Decreased breath sounds bilaterally, few rales at the bases S1-S2 appreciated Abdomen is soft bowel sounds normoactive extremities shows no clubbing no edema neurologically alert oriented x3   Resolved Hospital Problem list     Assessment & Plan: Multilobar pneumonia Strep pneumonia Viral pneumonia Continue current antibiotic therapy  Hemoptysis Small amount of altered blood No bright red blood Continue to monitor   Depression Continue duloxetine  Chronic pain syndrome  Hypothyroidism Synthroid  Anxiety  Acute respiratory failure Continue oxygen supplementation Continue bronchodilators Wean oxygen supplementation to maintain saturations greater than 90% Significant improvenemt  Chronic obstructive pulmonary disease Continue bronchodilators   Novel [place] testing pending results  Best practice:  Diet: Heart healthy Pain/Anxiety/Delirium protocol (if indicated): Tylenol VAP protocol (if indicated): Not indicated DVT prophylaxis: Heparin GI prophylaxis: Protonix Glucose control:  Mobility: Bedrest Code Status: Full code Family Communication: No family at bedside Disposition: Continue ICU management  Labs   CBC: Recent Labs  Lab 09/19/18 1129 09/20/18 0516 09/21/18 0710 09/22/18 0504  WBC 7.6 11.4* 10.5 9.6  NEUTROABS 7.2  --  8.6*  --   HGB 10.3* 10.5* 9.4* 10.8*  HCT 32.1* 33.8* 31.0* 34.6*  MCV 88.2 90.6 89.9 89.4  PLT 235 178 192 119    Basic Metabolic Panel: Recent Labs  Lab 09/19/18 1129 09/19/18 1517 09/20/18 0516 09/21/18 0710 09/22/18 0504  NA 136  --  135 135 137  K 3.2*  --  4.2 3.2* 3.8  CL 102  --  104 101 101  CO2  25  --  24 26 25   GLUCOSE 136*  --  90 108* 99  BUN 20  --  11 9 7*  CREATININE 0.90  --  0.76 0.68 0.74  CALCIUM 8.1*  --  8.0* 8.4* 8.9  MG  --  1.6*  --  1.8 1.8  PHOS  --  2.8  --   --   --    GFR: Estimated Creatinine Clearance: 49.4 mL/min (by C-G formula based on SCr of 0.74 mg/dL). Recent Labs  Lab 09/19/18 1129 09/19/18 1517 09/20/18 0516 09/21/18 0710 09/22/18 0504  PROCALCITON  --  4.90  --  4.92  --   WBC 7.6  --  11.4* 10.5 9.6  LATICACIDVEN 1.7 1.3  --   --   --     Liver Function Tests: Recent Labs  Lab 09/19/18 1129 09/21/18 0710 09/22/18 0504  AST 29 18 15   ALT 14 10 11   ALKPHOS 70 70 87  BILITOT 0.3 0.6 0.6  PROT 5.8* 5.9* 6.4*  ALBUMIN 3.2* 2.9* 3.0*   No results for input(s): LIPASE, AMYLASE in the last 168 hours. No results for input(s): AMMONIA in the last 168 hours.  ABG No results found for: PHART, PCO2ART, PO2ART, HCO3, TCO2, ACIDBASEDEF, O2SAT   Coagulation Profile: Recent Labs  Lab 09/19/18 1129  INR 0.9    Cardiac Enzymes: No results for input(s): CKTOTAL, CKMB, CKMBINDEX, TROPONINI in the last 168 hours.  HbA1C: Hgb A1c MFr Bld  Date/Time Value Ref Range Status  04/05/2016 11:55 AM 5.7 (H) 4.8 - 5.6 % Final    Comment:    (NOTE)         Pre-diabetes: 5.7 - 6.4         Diabetes: >6.4         Glycemic control for adults with diabetes: <7.0   11/30/2015 08:51 AM 5.8 (H) 4.8 - 5.6 % Final    Comment:    (NOTE)         Pre-diabetes: 5.7 - 6.4         Diabetes: >6.4         Glycemic control for adults with diabetes: <7.0     CBG: No results for input(s): GLUCAP in the last 168 hours.  Review of Systems:   Review of Systems  Constitutional: Positive for malaise/fatigue.  HENT: Negative.  Negative for hearing loss.   Eyes: Negative.   Respiratory: Positive for cough, hemoptysis and shortness of breath.   Cardiovascular: Negative.   Skin: Negative.

## 2018-09-22 NOTE — Progress Notes (Addendum)
PROGRESS NOTE    Stacy Dalton  BPZ:[id number] DOB: 22-Sep-1950 DOA: 09/19/2018 PCP: [name], FNP    Brief Narrative:   Stacy Dalton is a 68 y.o. female with a past medical history significant for COPD, chronic pain, depression and/anxiety, gastroesophageal reflux disease, hypertension, hypothyroidism and and chronic pain syndrome; who presented to the emergency department secondary to worsening shortness of breath, cough and fever.  Symptoms have been present for the last 4 days prior to admission and worsening.  Patient reported associated headaches and general malaise.  There has been runny nose and decreased appetite/oral intake.  Patient denies chest pain, nausea, vomiting, diarrhea, abdominal pain, dysuria, hemoptysis, hematemesis, melena, hematochezia, focal weakness or any other complaints. Patient reports been in contact with her grandkids and caring for them since the schools are closed; they have had URI symptoms.  No other sick contacts that she is aware, no recent travels.  In the ED patient was found with increased requirement for oxygen supplementation (O2 sat on room air in the low 80s), increased respiratory rate, tachycardic, febrile up to 103 and with lymphopenia on her CBC with differential. Chest x-ray demonstrated multilobar pneumonia.  Fluid resuscitation given as per sepsis protocol, started on IV antibiotics; influenza by PCR negative, RVP and COVID-19 swabs has been taken.   Assessment & Plan:   Principal Problem:   Multifocal pneumonia Active Problems:   Hypertension   HLD (hyperlipidemia)   Depression   Chronic pain syndrome   Hypothyroidism   Anxiety   GERD (gastroesophageal reflux disease)   Sepsis (HCC)   Acute respiratory failure with hypoxia (HCC)   Sepsis, present on admission Acute hypoxic respiratory failure Multifocal pneumonia Strep pneumonia/viral pneumonia Patient presenting as a transfer from any [facility] with  progressive fever, cough and dyspnea over the past 4 days.  Patient without any recent travel but has been watching her grandchildren over the past 4 days as they have been out of school with a sick contact with upper respiratory symptoms.  Patient comorbidities include COPD and hypertension.  Patient was febrile up to 103.5 with a white blood cell count 11.4 and lymphopenia.  LFTs within normal limits, MRSA PCR negative, rapid flu PCR negative.  LDH elevated at 120 with a normal ferritin.  CRP elevated to 5.1 with a procalcitonin of 4.90.  Respiratory viral panel positive for coronavirus NL 63.  Chest x-ray notable for patchy bibasilar opacities consistent with multilobar pneumonia.  Initially was on room air but quickly desaturated requiring high flow nasal cannula at outside hospital. --PCCM and Infectious disease following; appreciate assistance --COVID-19 pending --Blood cultures show no growth x3 days  --Sputum culture moderate gram-positive rods --Urine strep pneumo antigen positive --MRSA PCR negative; vancomycin d/c'd 3/21 --Continue azithromycin, ceftriaxone --Holding off steroids at this point --Continue airborne isolation awaiting results of COVID-19 --PPE used: CAPR/gown/gloves --Continue to titrate supplemental oxygen to maintain SPO2 greater than 90% --Supportive care  HLD  --Continue statin  Hypothyroidism TSH 1.372, wnl --Continue Synthroid 51mcg PO daily  Depression and anxiety --Continue home duloxetine 60 mg p.o. daily  Chronic pain syndrome --Oxycodone 10 mg every 4 hours prn  GERD  --Continue PPI    Essential hypertension --continue amlodipine  Hypokalemia Etiology likely albuterol usage and decreased oral intake --K 3.8 today --Continue to monitor daily electrolytes daily with magnesium  DVT prophylaxis: Heparin Code Status: Full code Family Communication: None Disposition Plan: Continue current level care, SDU, airborne isolation, awaiting COVID 19  test  Consultants:   PCCM  Infectious Disease  Procedures:   none  Antimicrobials:   Vancomycin 3/19 - 3-21  Azithromycin 3/19>>>  Ceftriaxone 3/19>>>   Subjective: Patient seen and examined at bedside, lying comfortably in bed.  States pain is better controlled.  Also reports breathing improved but continues with dyspnea and mild weakness.  Oxygen now titrated down to 1 L/min.  Nursing reports some hemoptysis with cough. No other complaints at this time.  Denies headache, no visual changes, no chest pain, no palpitations, no abdominal pain, no issues with bowel/bladder function, no paresthesias.  No acute concerns overnight per nursing staff.  Objective: Vitals:   09/22/18 0454 09/22/18 0514 09/22/18 0800 09/22/18 0924  BP:   (!) 143/73 (!) 166/75  Pulse:  73 78   Resp:  17 14   Temp: 98.3 F (36.8 C)     TempSrc: Oral     SpO2:  99% 94%   Weight:      Height:        Intake/Output Summary (Last 24 hours) at 09/22/2018 1155 Last data filed at 09/22/2018 0400 Gross per 24 hour  Intake 1190 ml  Output 3000 ml  Net -1810 ml   Filed Weights   09/19/18 1300 09/19/18 1832  Weight: 46.7 kg 51.5 kg    Examination:  General exam: No acute distress, mild discomfort, chronically ill in appearance Respiratory system: Breath sounds slightly decreased bilateral bases, mild late expiratory wheezing, normal respiratory effort, on 1 L nasal cannula Cardiovascular system: S1 & S2 heard, RRR. No JVD, murmurs, rubs, gallops or clicks. No pedal edema. Gastrointestinal system: Abdomen is nondistended, soft and nontender. No organomegaly or masses felt. Normal bowel sounds heard. Central nervous system: Alert and oriented. No focal neurological deficits. Extremities: Symmetric 5 x 5 power. Skin: No rashes, lesions or ulcers Psychiatry: Judgement and insight appear normal. Mood & affect appropriate.     Data Reviewed: I have personally reviewed following labs and imaging studies   CBC: Recent Labs  Lab 09/19/18 1129 09/20/18 0516 09/21/18 0710 09/22/18 0504  WBC 7.6 11.4* 10.5 9.6  NEUTROABS 7.2  --  8.6*  --   HGB 10.3* 10.5* 9.4* 10.8*  HCT 32.1* 33.8* 31.0* 34.6*  MCV 88.2 90.6 89.9 89.4  PLT 235 178 192 235   Basic Metabolic Panel: Recent Labs  Lab 09/19/18 1129 09/19/18 1517 09/20/18 0516 09/21/18 0710 09/22/18 0504  NA 136  --  135 135 137  K 3.2*  --  4.2 3.2* 3.8  CL 102  --  104 101 101  CO2 25  --  24 26 25   GLUCOSE 136*  --  90 108* 99  BUN 20  --  11 9 7*  CREATININE 0.90  --  0.76 0.68 0.74  CALCIUM 8.1*  --  8.0* 8.4* 8.9  MG  --  1.6*  --  1.8 1.8  PHOS  --  2.8  --   --   --    GFR: Estimated Creatinine Clearance: 49.4 mL/min (by C-G formula based on SCr of 0.74 mg/dL). Liver Function Tests: Recent Labs  Lab 09/19/18 1129 09/21/18 0710 09/22/18 0504  AST 29 18 15   ALT 14 10 11   ALKPHOS 70 70 87  BILITOT 0.3 0.6 0.6  PROT 5.8* 5.9* 6.4*  ALBUMIN 3.2* 2.9* 3.0*   No results for input(s): LIPASE, AMYLASE in the last 168 hours. No results for input(s): AMMONIA in the last 168 hours. Coagulation Profile: Recent Labs  Lab  09/19/18 1129  INR 0.9   Cardiac Enzymes: No results for input(s): CKTOTAL, CKMB, CKMBINDEX, TROPONINI in the last 168 hours. BNP (last 3 results) No results for input(s): PROBNP in the last 8760 hours. HbA1C: No results for input(s): HGBA1C in the last 72 hours. CBG: No results for input(s): GLUCAP in the last 168 hours. Lipid Profile: No results for input(s): CHOL, HDL, LDLCALC, TRIG, CHOLHDL, LDLDIRECT in the last 72 hours. Thyroid Function Tests: Recent Labs    09/19/18 1517  TSH 1.372   Anemia Panel: Recent Labs    09/19/18 1517  FERRITIN 26   Sepsis Labs: Recent Labs  Lab 09/19/18 1129 09/19/18 1517 09/21/18 0710  PROCALCITON  --  4.90 4.92  LATICACIDVEN 1.7 1.3  --     Recent Results (from the past 240 hour(s))  Culture, blood (Routine x 2)     Status: None  (Preliminary result)   Collection Time: 09/19/18 11:29 AM  Result Value Ref Range Status   Specimen Description BLOOD RIGHT WRIST  Final   Special Requests   Final    BOTTLES DRAWN AEROBIC AND ANAEROBIC Blood Culture adequate volume   Culture   Final    NO GROWTH 3 DAYS Performed at [facility], [address] [place], [place] [address]    Report Status PENDING  Incomplete  Respiratory Panel by PCR     Status: Abnormal   Collection Time: 09/19/18 11:41 AM  Result Value Ref Range Status   Adenovirus NOT DETECTED NOT DETECTED Final   Coronavirus 229E NOT DETECTED NOT DETECTED Final    Comment: (NOTE) The Coronavirus on the Respiratory Panel, DOES NOT test for the novel  Coronavirus (2019 nCoV)    Coronavirus HKU1 NOT DETECTED NOT DETECTED Final   Coronavirus NL63 DETECTED (A) NOT DETECTED Final   Coronavirus OC43 NOT DETECTED NOT DETECTED Final   Metapneumovirus NOT DETECTED NOT DETECTED Final   Rhinovirus / Enterovirus NOT DETECTED NOT DETECTED Final   Influenza A NOT DETECTED NOT DETECTED Final   Influenza B NOT DETECTED NOT DETECTED Final   Parainfluenza Virus 1 NOT DETECTED NOT DETECTED Final   Parainfluenza Virus 2 NOT DETECTED NOT DETECTED Final   Parainfluenza Virus 3 NOT DETECTED NOT DETECTED Final   Parainfluenza Virus 4 NOT DETECTED NOT DETECTED Final   Respiratory Syncytial Virus NOT DETECTED NOT DETECTED Final   Bordetella pertussis NOT DETECTED NOT DETECTED Final   Chlamydophila pneumoniae NOT DETECTED NOT DETECTED Final   Mycoplasma pneumoniae NOT DETECTED NOT DETECTED Final    Comment: Performed at [facility], [place] [address] [place], [place] [address]  Culture, blood (Routine x 2)     Status: None (Preliminary result)   Collection Time: 09/19/18 12:38 PM  Result Value Ref Range Status   Specimen Description BLOOD LEFT HAND  Final   Special Requests   Final    BOTTLES DRAWN AEROBIC AND ANAEROBIC Blood Culture adequate volume   Culture   Final    NO  GROWTH 3 DAYS Performed at [facility], [address], [place], [place] [address]    Report Status PENDING  Incomplete  MRSA PCR Screening     Status: None   Collection Time: 09/19/18  7:07 PM  Result Value Ref Range Status   MRSA by PCR NEGATIVE NEGATIVE Final    Comment:        The GeneXpert MRSA Assay (FDA approved for NASAL specimens only), is one component of a comprehensive MRSA colonization surveillance program. It  is not intended to diagnose MRSA infection nor to guide or monitor treatment for MRSA infections. Performed at [facility], [place] [address], [place], [place] [address]   Expectorated sputum assessment w rflx to resp cult     Status: None   Collection Time: 09/20/18  9:56 AM  Result Value Ref Range Status   Specimen Description SPUTUM  Final   Special Requests NONE  Final   Sputum evaluation   Final    THIS SPECIMEN IS ACCEPTABLE FOR SPUTUM CULTURE Performed at [facility], [place] [address], [place], [place] [address]    Report Status 09/20/2018 FINAL  Final  Culture, respiratory     Status: None (Preliminary result)   Collection Time: 09/20/18  9:56 AM  Result Value Ref Range Status   Specimen Description   Final    SPUTUM Performed at [place] [address] [place], [place] [address]    Special Requests   Final    NONE Reflexed from [id number] Performed at [facility], [place] [address], [place], [place] [address]    Gram Stain   Final    NO WBC SEEN FEW SQUAMOUS EPITHELIAL CELLS PRESENT MODERATE GRAM POSITIVE RODS    Culture   Final    FEW Consistent with normal respiratory flora. Performed at [facility], [place] [address], [place],  [address]    Report Status PENDING  Incomplete         Radiology Studies: No results found.      Scheduled Meds: . amLODipine  10 mg Oral Daily  . atorvastatin  20 mg Oral Daily  . Chlorhexidine Gluconate Cloth  6  each Topical Daily  . DULoxetine  60 mg Oral Daily  . feeding supplement (ENSURE ENLIVE)  237 mL Oral BID BM  . fluticasone  1 spray Each Nare Daily  . heparin injection (subcutaneous)  5,000 Units Subcutaneous Q8H  . levothyroxine  25 mcg Oral QAC breakfast  . loratadine  10 mg Oral Daily  . mouth rinse  15 mL Mouth Rinse BID  . mometasone-formoterol  2 puff Inhalation BID  . multivitamin with minerals  1 tablet Oral Daily  . pantoprazole  40 mg Oral Daily  . umeclidinium bromide  1 puff Inhalation Daily   Continuous Infusions: . azithromycin Stopped (09/21/18 1840)  . cefTRIAXone (ROCEPHIN)  IV Stopped (09/21/18 1757)     LOS: 3 days    Time spent: 33 minutes    Eric J [place] DO [facility] Pager [phone]  If 7PM-7AM, please contact night-coverage [email or web address] Password [facility] 09/22/2018, 11:55 AM

## 2018-09-23 ENCOUNTER — Other Ambulatory Visit: Payer: Self-pay | Admitting: Family

## 2018-09-23 DIAGNOSIS — J9601 Acute respiratory failure with hypoxia: Secondary | ICD-10-CM

## 2018-09-23 DIAGNOSIS — J441 Chronic obstructive pulmonary disease with (acute) exacerbation: Secondary | ICD-10-CM

## 2018-09-23 MED ORDER — ALBUTEROL SULFATE HFA 108 (90 BASE) MCG/ACT IN AERS
2.0000 | INHALATION_SPRAY | Freq: Four times a day (QID) | RESPIRATORY_TRACT | Status: DC | PRN
  Filled 2018-09-23: qty 6.7

## 2018-09-23 MED ORDER — ALBUTEROL SULFATE (2.5 MG/3ML) 0.083% IN NEBU: 2.5000 mg | INHALATION_SOLUTION | Freq: Four times a day (QID) | RESPIRATORY_TRACT | Status: DC | PRN

## 2018-09-23 NOTE — Progress Notes (Signed)
OT Cancellation Note  Patient Details Name: Stacy Dalton MRN: [id number] DOB: [birth year]   Cancelled Treatment:    Reason Eval/Treat Not Completed: Other (comment)  Per health system leadership, therapy services are being held at this time until patient tests negative for COVID-19.    Luqu[place], Mickel Baas, OT Acute Rehabilitation Services [phone]    09/23/2018, 9:20 AM

## 2018-09-23 NOTE — Progress Notes (Signed)
NAME:  Stacy Dalton, MRN:  [id number], DOB:  01-Dec-1950, LOS: 4 ADMISSION DATE:  09/19/2018, CONSULTATION DATE:  09/20/18 REFERRING MD:  [place] CHIEF COMPLAINT:  Dyspnea   Brief History   68 y/o female with COPD admitted for respiratory failure, malaise.  She has a history of COPD. Noted to have coronavirus (not SARS-COV2) on respiratory viral panel testing and urine strep pneumo positive.   Past Medical History  COPD, GERD, Hypertension, Hypothyroidism  Significant Hospital Events   3/19 admission  Consults:  PCCM  Procedures:  none  Significant Diagnostic Tests:  CXR> RLL infiltrate, emphysema   Micro Data:  Urine strep pneumo> positive RSV> coronavirus NL63 positive Novel Coronavirus > pending  Antimicrobials:  3/22 Ceftriaxone >  3/22 Azithromycin >    Interim history/subjective:  Feels a little better, but still fatigued, mildly dyspneic, cough somewhat improved but still has mucus production  Objective   Blood pressure (!) 141/77, pulse 70, temperature 98.6 F (37 C), temperature source Oral, resp. rate 12, height 5\' 1"  (1.549 m), weight 51.5 kg, SpO2 94 %.        Intake/Output Summary (Last 24 hours) at 09/23/2018 1001 Last data filed at 09/23/2018 8828 Gross per 24 hour  Intake 250 ml  Output 2150 ml  Net -1900 ml   Filed Weights   09/19/18 1300 09/19/18 1832  Weight: 46.7 kg 51.5 kg    Examination:  General:  Resting comfortably in bed HENT: NCAT OP clear PULM: CTA B, normal effort CV: RRR, no mgr GI: BS+, soft, nontender MSK: normal bulk and tone Neuro: awake, alert, no distress, MAEW    Resolved Hospital Problem list     Assessment & Plan:  Strep pneumo pneumonia Coronavirus NL63 positive > continue ceftriaxone/azithro 7 days > supportive care > I counseled her that she will feel poorly for months given her diagnosis of pneumonia and will take weeks to recover, however if she has new fever, new cough, new dyspnea she needs to  let us know  COPD, not in exacerbation > continue [place], Dulera > continue prn albuterol  COVID Risk: based on clinical grounds (now normal O2 on RA, lymphopenia resolved, diagnosed with strep pneumonia, non-novel coronavirus) she should be considered low risk for transmission, however there is conflicting evidence in [facility] right now as there is a warning saying that SARS-COV2 has been detected, but the lab result itself says it is pending.  Plan to maintain high risk for now but will move to floor as she no longer needs ICU level care.  Will continue to discuss this alert in [facility] with infection prevention.  Best practice:  Per [facility]  Labs   CBC: Recent Labs  Lab 09/19/18 1129 09/20/18 0516 09/21/18 0710 09/22/18 0504  WBC 7.6 11.4* 10.5 9.6  NEUTROABS 7.2  --  8.6*  --   HGB 10.3* 10.5* 9.4* 10.8*  HCT 32.1* 33.8* 31.0* 34.6*  MCV 88.2 90.6 89.9 89.4  PLT 235 178 192 003    Basic Metabolic Panel: Recent Labs  Lab 09/19/18 1129 09/19/18 1517 09/20/18 0516 09/21/18 0710 09/22/18 0504  NA 136  --  135 135 137  K 3.2*  --  4.2 3.2* 3.8  CL 102  --  104 101 101  CO2 25  --  24 26 25   GLUCOSE 136*  --  90 108* 99  BUN 20  --  11 9 7*  CREATININE 0.90  --  0.76 0.68 0.74  CALCIUM 8.1*  --  8.0* 8.4* 8.9  MG  --  1.6*  --  1.8 1.8  PHOS  --  2.8  --   --   --    GFR: Estimated Creatinine Clearance: 49.4 mL/min (by C-G formula based on SCr of 0.74 mg/dL). Recent Labs  Lab 09/19/18 1129 09/19/18 1517 09/20/18 0516 09/21/18 0710 09/22/18 0504  PROCALCITON  --  4.90  --  4.92  --   WBC 7.6  --  11.4* 10.5 9.6  LATICACIDVEN 1.7 1.3  --   --   --     Liver Function Tests: Recent Labs  Lab 09/19/18 1129 09/21/18 0710 09/22/18 0504  AST 29 18 15   ALT 14 10 11   ALKPHOS 70 70 87  BILITOT 0.3 0.6 0.6  PROT 5.8* 5.9* 6.4*  ALBUMIN 3.2* 2.9* 3.0*   No results for input(s): LIPASE, AMYLASE in the last 168 hours. No results for input(s): AMMONIA in the last 168  hours.  ABG No results found for: PHART, PCO2ART, PO2ART, HCO3, TCO2, ACIDBASEDEF, O2SAT   Coagulation Profile: Recent Labs  Lab 09/19/18 1129  INR 0.9    Cardiac Enzymes: No results for input(s): CKTOTAL, CKMB, CKMBINDEX, TROPONINI in the last 168 hours.  HbA1C: Hgb A1c MFr Bld  Date/Time Value Ref Range Status  04/05/2016 11:55 AM 5.7 (H) 4.8 - 5.6 % Final    Comment:    (NOTE)         Pre-diabetes: 5.7 - 6.4         Diabetes: >6.4         Glycemic control for adults with diabetes: <7.0   11/30/2015 08:51 AM 5.8 (H) 4.8 - 5.6 % Final    Comment:    (NOTE)         Pre-diabetes: 5.7 - 6.4         Diabetes: >6.4         Glycemic control for adults with diabetes: <7.0     CBG: No results for input(s): GLUCAP in the last 168 hours.  Roselie Awkward, MD [facility] Pager: [phone] Cell: [phone] If no response, call [phone]

## 2018-09-23 NOTE — Progress Notes (Signed)
PROGRESS NOTE    Stacy Dalton  VHQ:[id number] DOB: 05-13-1951 DOA: 09/19/2018 PCP: [name], FNP    Brief Narrative:   Stacy Dalton is a 68 y.o. female with a past medical history significant for COPD, chronic pain, depression and/anxiety, gastroesophageal reflux disease, hypertension, hypothyroidism and and chronic pain syndrome; who presented to the emergency department secondary to worsening shortness of breath, cough and fever.  Symptoms have been present for the last 4 days prior to admission and worsening.  Patient reported associated headaches and general malaise.  There has been runny nose and decreased appetite/oral intake.  Patient denies chest pain, nausea, vomiting, diarrhea, abdominal pain, dysuria, hemoptysis, hematemesis, melena, hematochezia, focal weakness or any other complaints. Patient reports been in contact with her grandkids and caring for them since the schools are closed; they have had URI symptoms.  No other sick contacts that she is aware, no recent travels.  In the ED patient was found with increased requirement for oxygen supplementation (O2 sat on room air in the low 80s), increased respiratory rate, tachycardic, febrile up to 103 and with lymphopenia on her CBC with differential. Chest x-ray demonstrated multilobar pneumonia.  Fluid resuscitation given as per sepsis protocol, started on IV antibiotics; influenza by PCR negative, RVP and COVID-19 swabs has been taken.   Assessment & Plan:   Principal Problem:   Multifocal pneumonia Active Problems:   Hypertension   HLD (hyperlipidemia)   Depression   Chronic pain syndrome   Hypothyroidism   Anxiety   GERD (gastroesophageal reflux disease)   Sepsis (HCC)   Acute respiratory failure with hypoxia (HCC)   Sepsis, present on admission Acute hypoxic respiratory failure Multifocal pneumonia Strep pneumonia/viral pneumonia Patient presenting as a transfer [facility] with  progressive fever, cough and dyspnea over the past 4 days.  Patient without any recent travel but has been watching her grandchildren over the past 4 days as they have been out of school with a sick contact with upper respiratory symptoms.  Patient comorbidities include COPD and hypertension.  Patient was febrile up to 103.5 with a white blood cell count 11.4 and lymphopenia.  LFTs within normal limits, MRSA PCR negative, rapid flu PCR negative.  LDH elevated at 120 with a normal ferritin.  CRP elevated to 5.1 with a procalcitonin of 4.90.  Respiratory viral panel positive for coronavirus NL 63.  Chest x-ray notable for patchy bibasilar opacities consistent with multilobar pneumonia.  Initially was on room air but quickly desaturated requiring high flow nasal cannula at outside hospital. --PCCM and Infectious disease following; appreciate assistance --COVID-19 pending --Blood cultures show no growth x4 days  --Sputum culture moderate gram-positive rods --Urine strep pneumo antigen positive --MRSA PCR negative; vancomycin d/c'd 3/21 --Continue azithromycin, ceftriaxone --Holding off steroids at this point --Continue airborne isolation awaiting results of COVID-19 --PPE used: CAPR/gown/gloves --Continue to titrate supplemental oxygen to maintain SPO2 greater than 90% --Supportive care  HLD  --Continue statin  Hypothyroidism TSH 1.372, wnl --Continue Synthroid 71mcg PO daily  Depression and anxiety --Continue home duloxetine 60 mg p.o. daily  Chronic pain syndrome --Oxycodone 10 mg every 4 hours prn  GERD  --Continue PPI    Essential hypertension --continue amlodipine  Hypokalemia Etiology likely albuterol usage and decreased oral intake --K 3.8 today --Continue to monitor daily electrolytes daily with magnesium  DVT prophylaxis: Heparin Code Status: Full code Family Communication: None Disposition Plan: Continue current level care, SDU, airborne isolation, awaiting COVID 19  test  Consultants:   PCCM  Infectious Disease  Procedures:   none  Antimicrobials:   Vancomycin 3/19 - 3-21  Azithromycin 3/19 - 3/23  Ceftriaxone 3/19>>>   Subjective: Patient seen and examined at bedside, lying comfortably in bed.  States pain is better controlled.  Also reports breathing improved but continues with dyspnea and mild weakness.  Oxygen now titrated down to 1 L/min.  Nursing reports some hemoptysis with cough. No other complaints at this time.  Denies headache, no visual changes, no chest pain, no palpitations, no abdominal pain, no issues with bowel/bladder function, no paresthesias.  No acute concerns overnight per nursing staff.  Objective: Vitals:   09/23/18 0628 09/23/18 0800 09/23/18 0920 09/23/18 1000  BP:  (!) 141/77  (!) 152/66  Pulse: 71 70  72  Resp: 13 12  10   Temp: 97.7 F (36.5 C) 98.6 F (37 C)    TempSrc: Oral Oral    SpO2: 98% 95% 94% 91%  Weight:      Height:        Intake/Output Summary (Last 24 hours) at 09/23/2018 1126 Last data filed at 09/23/2018 6270 Gross per 24 hour  Intake 250 ml  Output 2150 ml  Net -1900 ml   Filed Weights   09/19/18 1300 09/19/18 1832  Weight: 46.7 kg 51.5 kg    Examination:  General exam: No acute distress, mild discomfort, chronically ill in appearance Respiratory system: Breath sounds slightly decreased bilateral bases, mild late expiratory wheezing, normal respiratory effort, on room air Cardiovascular system: S1 & S2 heard, RRR. No JVD, murmurs, rubs, gallops or clicks. No pedal edema. Gastrointestinal system: Abdomen is nondistended, soft and nontender. No organomegaly or masses felt. Normal bowel sounds heard. Central nervous system: Alert and oriented. No focal neurological deficits. Extremities: Symmetric 5 x 5 power. Skin: No rashes, lesions or ulcers Psychiatry: Judgement and insight appear normal. Mood & affect appropriate.     Data Reviewed: I have personally reviewed following  labs and imaging studies  CBC: Recent Labs  Lab 09/19/18 1129 09/20/18 0516 09/21/18 0710 09/22/18 0504  WBC 7.6 11.4* 10.5 9.6  NEUTROABS 7.2  --  8.6*  --   HGB 10.3* 10.5* 9.4* 10.8*  HCT 32.1* 33.8* 31.0* 34.6*  MCV 88.2 90.6 89.9 89.4  PLT 235 178 192 350   Basic Metabolic Panel: Recent Labs  Lab 09/19/18 1129 09/19/18 1517 09/20/18 0516 09/21/18 0710 09/22/18 0504  NA 136  --  135 135 137  K 3.2*  --  4.2 3.2* 3.8  CL 102  --  104 101 101  CO2 25  --  24 26 25   GLUCOSE 136*  --  90 108* 99  BUN 20  --  11 9 7*  CREATININE 0.90  --  0.76 0.68 0.74  CALCIUM 8.1*  --  8.0* 8.4* 8.9  MG  --  1.6*  --  1.8 1.8  PHOS  --  2.8  --   --   --    GFR: Estimated Creatinine Clearance: 49.4 mL/min (by C-G formula based on SCr of 0.74 mg/dL). Liver Function Tests: Recent Labs  Lab 09/19/18 1129 09/21/18 0710 09/22/18 0504  AST 29 18 15   ALT 14 10 11   ALKPHOS 70 70 87  BILITOT 0.3 0.6 0.6  PROT 5.8* 5.9* 6.4*  ALBUMIN 3.2* 2.9* 3.0*   No results for input(s): LIPASE, AMYLASE in the last 168 hours. No results for input(s): AMMONIA in the last 168 hours. Coagulation Profile: Recent  Labs  Lab 09/19/18 1129  INR 0.9   Cardiac Enzymes: No results for input(s): CKTOTAL, CKMB, CKMBINDEX, TROPONINI in the last 168 hours. BNP (last 3 results) No results for input(s): PROBNP in the last 8760 hours. HbA1C: No results for input(s): HGBA1C in the last 72 hours. CBG: No results for input(s): GLUCAP in the last 168 hours. Lipid Profile: No results for input(s): CHOL, HDL, LDLCALC, TRIG, CHOLHDL, LDLDIRECT in the last 72 hours. Thyroid Function Tests: No results for input(s): TSH, T4TOTAL, FREET4, T3FREE, THYROIDAB in the last 72 hours. Anemia Panel: No results for input(s): VITAMINB12, FOLATE, FERRITIN, TIBC, IRON, RETICCTPCT in the last 72 hours. Sepsis Labs: Recent Labs  Lab 09/19/18 1129 09/19/18 1517 09/21/18 0710  PROCALCITON  --  4.90 4.92  LATICACIDVEN  1.7 1.3  --     Recent Results (from the past 240 hour(s))  Culture, blood (Routine x 2)     Status: None (Preliminary result)   Collection Time: 09/19/18 11:29 AM  Result Value Ref Range Status   Specimen Description BLOOD RIGHT WRIST  Final   Special Requests   Final    BOTTLES DRAWN AEROBIC AND ANAEROBIC Blood Culture adequate volume   Culture   Final    NO GROWTH 4 DAYS Performed at [facility], [address] [place], [place] [address]    Report Status PENDING  Incomplete  Novel Coronavirus, NAA (hospital order; send-out to ref lab)     Status: None (Preliminary result)   Collection Time: 09/19/18 11:40 AM  Result Value Ref Range Status   SARS-CoV-2, NAA PENDING NOT DETECTED Incomplete  Respiratory Panel by PCR     Status: Abnormal   Collection Time: 09/19/18 11:41 AM  Result Value Ref Range Status   Adenovirus NOT DETECTED NOT DETECTED Final   Coronavirus 229E NOT DETECTED NOT DETECTED Final    Comment: (NOTE) The Coronavirus on the Respiratory Panel, DOES NOT test for the novel  Coronavirus (2019 nCoV)    Coronavirus HKU1 NOT DETECTED NOT DETECTED Final   Coronavirus NL63 DETECTED (A) NOT DETECTED Final   Coronavirus OC43 NOT DETECTED NOT DETECTED Final   Metapneumovirus NOT DETECTED NOT DETECTED Final   Rhinovirus / Enterovirus NOT DETECTED NOT DETECTED Final   Influenza A NOT DETECTED NOT DETECTED Final   Influenza B NOT DETECTED NOT DETECTED Final   Parainfluenza Virus 1 NOT DETECTED NOT DETECTED Final   Parainfluenza Virus 2 NOT DETECTED NOT DETECTED Final   Parainfluenza Virus 3 NOT DETECTED NOT DETECTED Final   Parainfluenza Virus 4 NOT DETECTED NOT DETECTED Final   Respiratory Syncytial Virus NOT DETECTED NOT DETECTED Final   Bordetella pertussis NOT DETECTED NOT DETECTED Final   Chlamydophila pneumoniae NOT DETECTED NOT DETECTED Final   Mycoplasma pneumoniae NOT DETECTED NOT DETECTED Final    Comment: Performed at [facility], [address],  [place], [place] [address]  Culture, blood (Routine x 2)     Status: None (Preliminary result)   Collection Time: 09/19/18 12:38 PM  Result Value Ref Range Status   Specimen Description BLOOD LEFT HAND  Final   Special Requests   Final    BOTTLES DRAWN AEROBIC AND ANAEROBIC Blood Culture adequate volume   Culture   Final    NO GROWTH 4 DAYS Performed at [facility], [address], [place], [place] [address]    Report Status PENDING  Incomplete  MRSA PCR Screening     Status: None   Collection Time: 09/19/18  7:07  PM  Result Value Ref Range Status   MRSA by PCR NEGATIVE NEGATIVE Final    Comment:        The GeneXpert MRSA Assay (FDA approved for NASAL specimens only), is one component of a comprehensive MRSA colonization surveillance program. It is not intended to diagnose MRSA infection nor to guide or monitor treatment for MRSA infections. Performed at [facility], [place] [address], [place], [place] [address]   Expectorated sputum assessment w rflx to resp cult     Status: None   Collection Time: 09/20/18  9:56 AM  Result Value Ref Range Status   Specimen Description SPUTUM  Final   Special Requests NONE  Final   Sputum evaluation   Final    THIS SPECIMEN IS ACCEPTABLE FOR SPUTUM CULTURE Performed at [facility], [place] [address] [place], [place] [address]    Report Status 09/20/2018 FINAL  Final  Culture, respiratory     Status: None   Collection Time: 09/20/18  9:56 AM  Result Value Ref Range Status   Specimen Description   Final    SPUTUM Performed at [place] [address] [place], [place] [address]    Special Requests   Final    NONE Reflexed from [phone] [phone] Performed at [facility], [place] [address], [place], [place] [address]    Gram Stain   Final    NO WBC SEEN FEW SQUAMOUS EPITHELIAL CELLS PRESENT MODERATE GRAM POSITIVE RODS    Culture   Final    FEW Consistent with normal  respiratory flora. Performed at [facility], [place] [address] [place], [place] [address]    Report Status 09/22/2018 FINAL  Final         Radiology Studies: No results found.      Scheduled Meds: . amLODipine  10 mg Oral Daily  . atorvastatin  20 mg Oral Daily  . Chlorhexidine Gluconate Cloth  6 each Topical Daily  . DULoxetine  60 mg Oral Daily  . feeding supplement (ENSURE ENLIVE)  237 mL Oral BID BM  . fluticasone  1 spray Each Nare Daily  . heparin injection (subcutaneous)  5,000 Units Subcutaneous Q8H  . levothyroxine  25 mcg Oral QAC breakfast  . loratadine  10 mg Oral Daily  . mouth rinse  15 mL Mouth Rinse BID  . mometasone-formoterol  2 puff Inhalation BID  . multivitamin with minerals  1 tablet Oral Daily  . pantoprazole  40 mg Oral Daily  . umeclidinium bromide  1 puff Inhalation Daily   Continuous Infusions: . azithromycin Stopped (09/22/18 2217)  . cefTRIAXone (ROCEPHIN)  IV Stopped (09/22/18 2026)     LOS: 4 days    Time spent: 29 minutes    Parnell Spieler J [place] DO [facility] Pager [phone]  If 7PM-7AM, please contact night-coverage [email or web address] Password [facility] 09/23/2018, 11:26 AM

## 2018-09-23 NOTE — Progress Notes (Signed)
Received pt from ICU/SD in stable condition. Pt place on Droplet/Contact PPE as order, Covid-19 test pending. Pt placed on O2 1.5 liters. Will continue to monitor. SRP, RN

## 2018-09-23 NOTE — Procedures (Signed)
[facility] PCCM  PCCM available PRN  Roselie Awkward, MD [place] PCCM Pager: [phone] Cell: [phone] If no response, call [phone]

## 2018-09-23 NOTE — Progress Notes (Signed)
PT Cancellation Note  Patient Details Name: Stacy Dalton MRN: [id number] DOB: [birth year]   Cancelled Treatment:    Reason Eval/Treat Not Completed: Medical issues which prohibited therapy  Per health system leadership, therapy services are being held at this time until patient tests negative for COVID-19.      Johnella Crumm,KATHrine E 09/23/2018, 8:50 AM Carmelia Bake, PT, DPT Acute Rehabilitation Services Office: [phone] Pager: [phone]

## 2018-09-23 NOTE — Progress Notes (Signed)
INFECTIOUS DISEASE PROGRESS NOTE  ID: Stacy Dalton is a 68 y.o. female with  Principal Problem:   Multifocal pneumonia Active Problems:   Hypertension   HLD (hyperlipidemia)   Depression   Chronic pain syndrome   Hypothyroidism   Anxiety   GERD (gastroesophageal reflux disease)   Sepsis ([place])   Acute respiratory failure with hypoxia (HCC)  Subjective: Feels better.   Abtx:  Anti-infectives (From admission, onward)   Start     Dose/Rate Route Frequency Ordered Stop   09/20/18 1200  vancomycin (VANCOCIN) IVPB 750 mg/150 ml premix  Status:  Discontinued     750 mg 150 mL/hr over 60 Minutes Intravenous Every 24 hours 09/20/18 0158 09/21/18 1010   09/19/18 1800  cefTRIAXone (ROCEPHIN) 1 g in sodium chloride 0.9 % 100 mL IVPB     1 g 200 mL/hr over 30 Minutes Intravenous Every 24 hours 09/19/18 1702 09/26/18 1759   09/19/18 1800  azithromycin (ZITHROMAX) 500 mg in sodium chloride 0.9 % 250 mL IVPB     500 mg 250 mL/hr over 60 Minutes Intravenous Every 24 hours 09/19/18 1702 09/23/18 2359   09/19/18 1145  vancomycin (VANCOCIN) IVPB 1000 mg/200 mL premix     1,000 mg 200 mL/hr over 60 Minutes Intravenous  Once 09/19/18 1138 09/19/18 1258   09/19/18 1145  piperacillin-tazobactam (ZOSYN) IVPB 3.375 g     3.375 g 100 mL/hr over 30 Minutes Intravenous  Once 09/19/18 1138 09/19/18 1248      Medications:  Scheduled: . amLODipine  10 mg Oral Daily  . atorvastatin  20 mg Oral Daily  . Chlorhexidine Gluconate Cloth  6 each Topical Daily  . DULoxetine  60 mg Oral Daily  . feeding supplement (ENSURE ENLIVE)  237 mL Oral BID BM  . fluticasone  1 spray Each Nare Daily  . heparin injection (subcutaneous)  5,000 Units Subcutaneous Q8H  . levothyroxine  25 mcg Oral QAC breakfast  . loratadine  10 mg Oral Daily  . mouth rinse  15 mL Mouth Rinse BID  . mometasone-formoterol  2 puff Inhalation BID  . multivitamin with minerals  1 tablet Oral Daily  . pantoprazole  40 mg Oral Daily   . umeclidinium bromide  1 puff Inhalation Daily    Objective: Vital signs in last 24 hours: Temp:  [97.7 F (36.5 C)-100.1 F (37.8 C)] 98.7 F (37.1 C) (03/23 1200) Pulse Rate:  [70-89] 89 (03/23 1400) Resp:  [10-20] 12 (03/23 1400) BP: (131-152)/(66-77) 145/69 (03/23 1400) SpO2:  [90 %-98 %] 92 % (03/23 1400)   General appearance: alert, cooperative and no distress Resp: clear to auscultation bilaterally Cardio: regular rate and rhythm GI: normal findings: bowel sounds normal and soft, non-tender  Lab Results Recent Labs    09/21/18 0710 09/22/18 0504  WBC 10.5 9.6  HGB 9.4* 10.8*  HCT 31.0* 34.6*  NA 135 137  K 3.2* 3.8  CL 101 101  CO2 26 25  BUN 9 7*  CREATININE 0.68 0.74   Liver Panel Recent Labs    09/21/18 0710 09/22/18 0504  PROT 5.9* 6.4*  ALBUMIN 2.9* 3.0*  AST 18 15  ALT 10 11  ALKPHOS 70 87  BILITOT 0.6 0.6   Sedimentation Rate No results for input(s): ESRSEDRATE in the last 72 hours. C-Reactive Protein No results for input(s): CRP in the last 72 hours.  Microbiology: Recent Results (from the past 240 hour(s))  Culture, blood (Routine x 2)     Status: None (  Preliminary result)   Collection Time: 09/19/18 11:29 AM  Result Value Ref Range Status   Specimen Description BLOOD RIGHT WRIST  Final   Special Requests   Final    BOTTLES DRAWN AEROBIC AND ANAEROBIC Blood Culture adequate volume   Culture   Final    NO GROWTH 4 DAYS Performed at [facility], [address] [place], [place] [address]    Report Status PENDING  Incomplete  Novel Coronavirus, NAA (hospital order; send-out to ref lab)     Status: None (Preliminary result)   Collection Time: 09/19/18 11:40 AM  Result Value Ref Range Status   SARS-CoV-2, NAA PENDING NOT DETECTED Incomplete  Respiratory Panel by PCR     Status: Abnormal   Collection Time: 09/19/18 11:41 AM  Result Value Ref Range Status   Adenovirus NOT DETECTED NOT DETECTED Final   Coronavirus 229E NOT DETECTED  NOT DETECTED Final    Comment: (NOTE) The Coronavirus on the Respiratory Panel, DOES NOT test for the novel  Coronavirus (2019 nCoV)    Coronavirus HKU1 NOT DETECTED NOT DETECTED Final   Coronavirus NL63 DETECTED (A) NOT DETECTED Final   Coronavirus OC43 NOT DETECTED NOT DETECTED Final   Metapneumovirus NOT DETECTED NOT DETECTED Final   Rhinovirus / Enterovirus NOT DETECTED NOT DETECTED Final   Influenza A NOT DETECTED NOT DETECTED Final   Influenza B NOT DETECTED NOT DETECTED Final   Parainfluenza Virus 1 NOT DETECTED NOT DETECTED Final   Parainfluenza Virus 2 NOT DETECTED NOT DETECTED Final   Parainfluenza Virus 3 NOT DETECTED NOT DETECTED Final   Parainfluenza Virus 4 NOT DETECTED NOT DETECTED Final   Respiratory Syncytial Virus NOT DETECTED NOT DETECTED Final   Bordetella pertussis NOT DETECTED NOT DETECTED Final   Chlamydophila pneumoniae NOT DETECTED NOT DETECTED Final   Mycoplasma pneumoniae NOT DETECTED NOT DETECTED Final    Comment: Performed at [facility], [address], [place], [place] [address]  Culture, blood (Routine x 2)     Status: None (Preliminary result)   Collection Time: 09/19/18 12:38 PM  Result Value Ref Range Status   Specimen Description BLOOD LEFT HAND  Final   Special Requests   Final    BOTTLES DRAWN AEROBIC AND ANAEROBIC Blood Culture adequate volume   Culture   Final    NO GROWTH 4 DAYS Performed at [facility], [address], [place], [place] [address]    Report Status PENDING  Incomplete  MRSA PCR Screening     Status: None   Collection Time: 09/19/18  7:07 PM  Result Value Ref Range Status   MRSA by PCR NEGATIVE NEGATIVE Final    Comment:        The GeneXpert MRSA Assay (FDA approved for NASAL specimens only), is one component of a comprehensive MRSA colonization surveillance program. It is not intended to diagnose MRSA infection nor to guide or monitor treatment for MRSA infections. Performed at [facility],  [place] [address] [place], [place] [address]   Expectorated sputum assessment w rflx to resp cult     Status: None   Collection Time: 09/20/18  9:56 AM  Result Value Ref Range Status   Specimen Description SPUTUM  Final   Special Requests NONE  Final   Sputum evaluation   Final    THIS SPECIMEN IS ACCEPTABLE FOR SPUTUM CULTURE Performed at [facility], [place] [address] [place],  [address]    Report Status 09/20/2018 FINAL  Final  Culture, respiratory     Status: None   Collection Time: 09/20/18  9:56 AM  Result Value Ref Range Status   Specimen Description   Final    SPUTUM Performed at [place] [address], [place], [place] [address]    Special Requests   Final    NONE Reflexed from [phone] Performed at [facility], [place] [address], [place], [place] [address]    Gram Stain   Final    NO WBC SEEN FEW SQUAMOUS EPITHELIAL CELLS PRESENT MODERATE GRAM POSITIVE RODS    Culture   Final    FEW Consistent with normal respiratory flora. Performed at [facility], [place] [address], [place], [place] [address]    Report Status 09/22/2018 FINAL  Final    Studies/Results: No results found.   Assessment/Plan: Pneumonia  Non-COVID corona virus on resp virus panel (NL63)  Step pneumo urine Ag+ COPD  Total days of antibiotics: 4 (ceftriaxone)     anbx set to stop on 3-26 Could consider home with levaquin when comfortable COVID pending, risk is very low.  Greatly appreciate hospitalist service Will follow peripherally.        Bobby Rumpf MD, FACP Infectious Diseases (pager) [phone] [email or web address] 09/23/2018, 2:34 PM  LOS: 4 days

## 2018-09-24 LAB — CULTURE, BLOOD (ROUTINE X 2)
Culture: NO GROWTH
Culture: NO GROWTH
Special Requests: ADEQUATE
Special Requests: ADEQUATE

## 2018-09-24 MED ORDER — ZOLPIDEM TARTRATE 5 MG PO TABS: 5.0000 mg | ORAL_TABLET | Freq: Once | ORAL | Status: DC

## 2018-09-24 MED ORDER — LEVOFLOXACIN 750 MG PO TABS: 750.0000 mg | ORAL_TABLET | Freq: Every day | ORAL | 0 refills | Status: AC

## 2018-09-24 MED ORDER — UMECLIDINIUM BROMIDE 62.5 MCG/INH IN AEPB: 1.0000 | INHALATION_SPRAY | Freq: Every day | RESPIRATORY_TRACT | 0 refills | Status: DC

## 2018-09-24 NOTE — Progress Notes (Signed)
SATURATION QUALIFICATIONS: (This note is used to comply with regulatory documentation for home oxygen)  Patient Saturations on Room Air at Rest = 94%  Patient Saturations on Room Air while Ambulating = 91%  Patient Saturations on Liters of oxygen while Ambulating = % N/A  Please briefly explain why patient needs home oxygen:  Patient 94% RA, down to 86% for 15-20 seconds initially, then came back up to 91-92%. Dropped again to 83% for another 15-20 seconds after 3 minutes of ambulation, rebounded quickly back to 91%.

## 2018-09-24 NOTE — Discharge Summary (Addendum)
Physician Discharge Summary  COLUMBIA PANDEY PTW:[id number] DOB: 1951/02/18 DOA: 09/19/2018  PCP: [name], FNP  Admit date: 09/19/2018 Discharge date: 09/24/2018  Admitted From: Home Disposition: Home  Recommendations for Outpatient Follow-up:  1. Follow up with PCP in 1 week 2. Please follow up on the following pending results: COVID-19 test  Home Health: no Equipment/Devices: none  Discharge Condition: Stable CODE STATUS: Full code Diet recommendation: Heart Healthy  History of present illness:  Stacy Dalton a 68 y.o.femalewith a past medical history significant for COPD, chronic pain, depression and/anxiety, gastroesophageal reflux disease, hypertension, hypothyroidism and and chronic pain syndrome; who presented to the emergency department secondary to worsening shortness of breath, cough and fever. Symptoms have been present for the last 4 days prior to admission and worsening. Patient reported associated headaches and general malaise. There has been runny nose and decreased appetite/oral intake. Patient denies chest pain, nausea, vomiting, diarrhea, abdominal pain, dysuria, hemoptysis, hematemesis, melena, hematochezia, focal weakness or any other complaints. Patient reports been in contact with her grandkids and caring for themsince the schools are closed; theyhave had URI symptoms.No other sick contacts that she is aware, no recent travels.  In the ED patient was found with increased requirement for oxygen supplementation (O2 sat on room air in the low 80s), increased respiratory rate, tachycardic, febrile up to 103 and with lymphopenia on her CBC with differential. Chest x-ray demonstrated multilobar pneumonia.Fluid resuscitation given as per sepsis protocol, started on IV antibiotics;influenza by PCR negative, RVP andCOVID-19 swabs has beentaken.  Hospital course:  Sepsis, present on admission Acute hypoxic respiratory failure Multifocal  pneumonia Strep pneumonia viral pneumonia Patient presenting as a transfer from any [facility] with progressive fever, cough and dyspnea over the past 4 days.  Patient without any recent travel but has been watching her grandchildren over the past 4 days as they have been out of school with a sick contact with upper respiratory symptoms.  Patient comorbidities include COPD and hypertension.  Patient was febrile up to 103.5 with a white blood cell count 11.4 and lymphopenia.  LFTs within normal limits, MRSA PCR negative, rapid flu PCR negative.  LDH elevated at 120 with a normal ferritin.  CRP elevated to 5.1 with a procalcitonin of 4.90.  Respiratory viral panel positive for coronavirus NL 63.  Chest x-ray notable for patchy bibasilar opacities consistent with multilobar pneumonia.  Initially was on room air but quickly desaturated requiring up to 7L high flow nasal cannula at outside hospital, and subsequently transferred to [facility].  Pulmonary critical care medicine and infectious disease was consulted during hospitalization.  Her blood cultures remain negative and urine strep pneumo antigen returned positive.  Her MRSA PCR was negative and subsequently her vancomycin was discontinued.  She continued on azithromycin and ceftriaxone.  She was not given steroids out of concern for possible SARS-COV-2 infection in accordance with current guidelines.  Patient tolerated treatment well with resolution of hypoxia with return to normal respiratory status.  She was ambulated without need of supplemental oxygen.  Will discharge home on Levaquin 750 mg p.o. daily for an additional 10 days per ID recommendations. Also will add incruse elipta to her home COPD regimen. Recommend follow-up PCP within 1 week for follow-up of COVID-19/SARS-COV-2 test.  Encouraged continued self isolation until test results return.  Addendum 10/21/2018: COVID 19 test returned back negative.  COVID-19 infection ruled  out.  HLD  Continue statin  Hypothyroidism TSH 1.372, wnl. Continue Synthroid 57mcg PO daily  Depressionand anxiety Continue home duloxetine 60 mg p.o. daily  Chronic pain syndrome Resume home tramadol  GERD  Continue PPI  Essential hypertension continue home antihypertensive regimen  Hypokalemia Etiology likely albuterol usage and decreased oral intake.  Repleted during hospitalization.  Remained stable with potassium 3.8 at time of discharge.  Discharge Diagnoses:  Principal Problem:   Multifocal pneumonia Active Problems:   Hypertension   HLD (hyperlipidemia)   Depression   Chronic pain syndrome   Hypothyroidism   COPD exacerbation (HCC)   Anxiety   GERD (gastroesophageal reflux disease)    Discharge Instructions  Discharge Instructions    Call MD for:  difficulty breathing, headache or visual disturbances   Complete by:  As directed    Call MD for:  persistant dizziness or light-headedness   Complete by:  As directed    Call MD for:  persistant nausea and vomiting   Complete by:  As directed    Call MD for:  temperature >100.4   Complete by:  As directed    Diet - low sodium heart healthy   Complete by:  As directed    Discharge instructions   Complete by:  As directed    Continue antibiotics with Levaquin as prescribed for Streptococcus pneumonia.  Continue self isolation until corona virus (COVID-19 test) returns.  Follow-up with PCP within 1 week to ensure resolution of symptoms and test results.   Increase activity slowly   Complete by:  As directed      Allergies as of 09/24/2018      Reactions   Toradol [ketorolac Tromethamine] Shortness Of Breath   Butalbital-apap-caffeine Other (See Comments)   jittery   Demerol Swelling   Esgic [butalbital-apap-caffeine] Other (See Comments)   jittery   Iodine Other (See Comments)   bp bottomed out per pt several hours later; unsure if pre medicated in past with cm; done in [place]       Medication List    TAKE these medications   amLODipine-olmesartan 10-40 MG tablet Commonly known as:  Azor Take 1 tablet by mouth daily.   atorvastatin 20 MG tablet Commonly known as:  Lipitor Take 1 tablet (20 mg total) by mouth daily.   BC HEADACHE POWDER PO Take 1 Dose by mouth daily as needed.   BREO ELLIPTA IN Inhale 1 puff into the lungs daily.   DULoxetine 60 MG capsule Commonly known as:  Cymbalta Take 1 capsule (60 mg total) by mouth daily.   HYDROcodone-homatropine 5-1.5 MG/5ML syrup Commonly known as:  HYCODAN Take 5 mLs by mouth every 8 (eight) hours as needed for cough.   levofloxacin 750 MG tablet Commonly known as:  Levaquin Take 1 tablet (750 mg total) by mouth daily for 10 days.   levothyroxine 25 MCG tablet Commonly known as:  SYNTHROID, LEVOTHROID Take 1 tablet (25 mcg total) by mouth daily before breakfast.   ondansetron 4 MG tablet Commonly known as:  ZOFRAN Take 1 tablet (4 mg total) by mouth every 8 (eight) hours as needed for nausea.   temazepam 30 MG capsule Commonly known as:  Restoril Take 1 capsule (30 mg total) by mouth at bedtime as needed for sleep.   traMADol 50 MG tablet Commonly known as:  ULTRAM Take 2 tablets (100 mg total) by mouth every 8 (eight) hours as needed (pain).   umeclidinium bromide 62.5 MCG/INH Aepb Commonly known as:  INCRUSE ELLIPTA Inhale 1 puff into the lungs daily.   Ventolin HFA 108 (90 Base) MCG/ACT  inhaler Generic drug:  albuterol Inhale 2 puffs into the lungs every 6 (six) hours as needed for wheezing or shortness of breath.   Vitamin D (Ergocalciferol) 1.25 MG (50000 UT) Caps capsule Commonly known as:  DRISDOL TAKE 1 CAPSULE (50000 UNITS) BY MOUTH EVERY 7 DAYS What changed:    how much to take  how to take this      Follow-up Information    [name], FNP. Call in 1 week(s).   Specialty:  Family Medicine Contact information: [place]  [id number] [phone]          Allergies  Allergen Reactions  . Toradol [Ketorolac Tromethamine] Shortness Of Breath  . Butalbital-Apap-Caffeine Other (See Comments)    jittery  . Demerol Swelling  . Esgic [Butalbital-Apap-Caffeine] Other (See Comments)    jittery  . Iodine Other (See Comments)    bp bottomed out per pt several hours later; unsure if pre medicated in past with cm; done in [place]    Consultations:  Pulmonary/critical care medicine  Infectious disease   Procedures/Studies: Dg Chest Portable 1 View  Result Date: 09/19/2018 CLINICAL DATA:  Dyspnea EXAM: PORTABLE CHEST 1 VIEW COMPARISON:  07/21/2018 chest radiograph. FINDINGS: Stable cardiomediastinal silhouette with normal heart size. No pneumothorax. No pleural effusion. Patchy bibasilar lung opacities are new. IMPRESSION: New patchy bibasilar lung opacities worrisome for multilobar pneumonia. Short-term chest radiograph follow-up advised. Electronically Signed   By: Ilona Sorrel M.D.   On: 09/19/2018 12:08     Subjective: Patient seen and examined at bedside, breathing much improved.  Denies any further shortness of breath or productive cough.  Titrated off of supplemental oxygen.  Ambulated without hypoxia by nursing staff today.  Ready for discharge home.  ID recommends discharge on Levaquin for further treatment of Streptococcus pneumonia.  No other complaints at this time.  Denies headache, no fever/chills/night sweats, no chest pain, no palpitations, no shortness of breath, no congestion, no nausea/vomiting/diarrhea, no abdominal pain, no weakness, no paresthesias.   Discharge Exam: Vitals:   09/23/18 2209 09/24/18 0432  BP: 128/69 140/76  Pulse: 77 68  Resp: 18 18  Temp: 98.9 F (37.2 C) 98.6 F (37 C)  SpO2: 96% 96%   Vitals:   09/23/18 1400 09/23/18 1505 09/23/18 2209 09/24/18 0432  BP: (!) 145/69 131/76 128/69 140/76  Pulse: 89 88 77 68  Resp: 12 20 18 18   Temp:  99.5 F (37.5 C) 98.9 F (37.2  C) 98.6 F (37 C)  TempSrc:  Oral Oral Oral  SpO2: 92% 91% 96% 96%  Weight:      Height:        General: Pt is alert, awake, not in acute distress Cardiovascular: RRR, S1/S2 +, no rubs, no gallops Respiratory: mild late expiratory wheezing, no crackles, no rhonchi, normal respiratory effort on room air. Abdominal: Soft, NT, ND, bowel sounds + Extremities: no edema, no cyanosis    The results of significant diagnostics from this hospitalization (including imaging, microbiology, ancillary and laboratory) are listed below for reference.     Microbiology: Recent Results (from the past 240 hour(s))  Culture, blood (Routine x 2)     Status: None   Collection Time: 09/19/18 11:29 AM  Result Value Ref Range Status   Specimen Description BLOOD RIGHT WRIST  Final   Special Requests   Final    BOTTLES DRAWN AEROBIC AND ANAEROBIC Blood Culture adequate volume   Culture   Final    NO GROWTH  5 DAYS Performed at [facility], [address] [place], [place] [address]    Report Status 09/24/2018 FINAL  Final  Novel Coronavirus, NAA (hospital order; send-out to ref lab)     Status: None (Preliminary result)   Collection Time: 09/19/18 11:40 AM  Result Value Ref Range Status   SARS-CoV-2, NAA PENDING NOT DETECTED Incomplete  Respiratory Panel by PCR     Status: Abnormal   Collection Time: 09/19/18 11:41 AM  Result Value Ref Range Status   Adenovirus NOT DETECTED NOT DETECTED Final   Coronavirus 229E NOT DETECTED NOT DETECTED Final    Comment: (NOTE) The Coronavirus on the Respiratory Panel, DOES NOT test for the novel  Coronavirus (2019 nCoV)    Coronavirus HKU1 NOT DETECTED NOT DETECTED Final   Coronavirus NL63 DETECTED (A) NOT DETECTED Final   Coronavirus OC43 NOT DETECTED NOT DETECTED Final   Metapneumovirus NOT DETECTED NOT DETECTED Final   Rhinovirus / Enterovirus NOT DETECTED NOT DETECTED Final   Influenza A NOT DETECTED NOT DETECTED Final   Influenza B NOT DETECTED NOT  DETECTED Final   Parainfluenza Virus 1 NOT DETECTED NOT DETECTED Final   Parainfluenza Virus 2 NOT DETECTED NOT DETECTED Final   Parainfluenza Virus 3 NOT DETECTED NOT DETECTED Final   Parainfluenza Virus 4 NOT DETECTED NOT DETECTED Final   Respiratory Syncytial Virus NOT DETECTED NOT DETECTED Final   Bordetella pertussis NOT DETECTED NOT DETECTED Final   Chlamydophila pneumoniae NOT DETECTED NOT DETECTED Final   Mycoplasma pneumoniae NOT DETECTED NOT DETECTED Final    Comment: Performed at [facility], [address] [place], [place] [address]  Culture, blood (Routine x 2)     Status: None   Collection Time: 09/19/18 12:38 PM  Result Value Ref Range Status   Specimen Description BLOOD LEFT HAND  Final   Special Requests   Final    BOTTLES DRAWN AEROBIC AND ANAEROBIC Blood Culture adequate volume   Culture   Final    NO GROWTH 5 DAYS Performed at [facility], [address], [place], [place] [address]    Report Status 09/24/2018 FINAL  Final  MRSA PCR Screening     Status: None   Collection Time: 09/19/18  7:07 PM  Result Value Ref Range Status   MRSA by PCR NEGATIVE NEGATIVE Final    Comment:        The GeneXpert MRSA Assay (FDA approved for NASAL specimens only), is one component of a comprehensive MRSA colonization surveillance program. It is not intended to diagnose MRSA infection nor to guide or monitor treatment for MRSA infections. Performed at [facility], [place] [address] [place], [place] [address]   Expectorated sputum assessment w rflx to resp cult     Status: None   Collection Time: 09/20/18  9:56 AM  Result Value Ref Range Status   Specimen Description SPUTUM  Final   Special Requests NONE  Final   Sputum evaluation   Final    THIS SPECIMEN IS ACCEPTABLE FOR SPUTUM CULTURE Performed at [facility], [place] [address], [place], [place] [address]    Report Status 09/20/2018 FINAL  Final  Culture, respiratory      Status: None   Collection Time: 09/20/18  9:56 AM  Result Value Ref Range Status   Specimen Description   Final    SPUTUM Performed at [place] [address] [place], [place] [address]    Special Requests   Final  NONE Reflexed from [id number] Performed at [facility], [place] [address], [place], [place] [address]    Gram Stain   Final    NO WBC SEEN FEW SQUAMOUS EPITHELIAL CELLS PRESENT MODERATE GRAM POSITIVE RODS    Culture   Final    FEW Consistent with normal respiratory flora. Performed at [facility], [place] [address] [place], [place] [address]    Report Status 09/22/2018 FINAL  Final     Labs: BNP (last 3 results) No results for input(s): BNP in the last 8760 hours. Basic Metabolic Panel: Recent Labs  Lab 09/19/18 1129 09/19/18 1517 09/20/18 0516 09/21/18 0710 09/22/18 0504  NA 136  --  135 135 137  K 3.2*  --  4.2 3.2* 3.8  CL 102  --  104 101 101  CO2 25  --  24 26 25   GLUCOSE 136*  --  90 108* 99  BUN 20  --  11 9 7*  CREATININE 0.90  --  0.76 0.68 0.74  CALCIUM 8.1*  --  8.0* 8.4* 8.9  MG  --  1.6*  --  1.8 1.8  PHOS  --  2.8  --   --   --    Liver Function Tests: Recent Labs  Lab 09/19/18 1129 09/21/18 0710 09/22/18 0504  AST 29 18 15   ALT 14 10 11   ALKPHOS 70 70 87  BILITOT 0.3 0.6 0.6  PROT 5.8* 5.9* 6.4*  ALBUMIN 3.2* 2.9* 3.0*   No results for input(s): LIPASE, AMYLASE in the last 168 hours. No results for input(s): AMMONIA in the last 168 hours. CBC: Recent Labs  Lab 09/19/18 1129 09/20/18 0516 09/21/18 0710 09/22/18 0504  WBC 7.6 11.4* 10.5 9.6  NEUTROABS 7.2  --  8.6*  --   HGB 10.3* 10.5* 9.4* 10.8*  HCT 32.1* 33.8* 31.0* 34.6*  MCV 88.2 90.6 89.9 89.4  PLT 235 178 192 228   Cardiac Enzymes: No results for input(s): CKTOTAL, CKMB, CKMBINDEX, TROPONINI in the last 168 hours. BNP: Invalid input(s): POCBNP CBG: No results for input(s): GLUCAP in the last 168  hours. D-Dimer No results for input(s): DDIMER in the last 72 hours. Hgb A1c No results for input(s): HGBA1C in the last 72 hours. Lipid Profile No results for input(s): CHOL, HDL, LDLCALC, TRIG, CHOLHDL, LDLDIRECT in the last 72 hours. Thyroid function studies No results for input(s): TSH, T4TOTAL, T3FREE, THYROIDAB in the last 72 hours.  Invalid input(s): FREET3 Anemia work up No results for input(s): VITAMINB12, FOLATE, FERRITIN, TIBC, IRON, RETICCTPCT in the last 72 hours. Urinalysis    Component Value Date/Time   COLORURINE YELLOW 09/19/2018 1129   APPEARANCEUR CLEAR 09/19/2018 1129   APPEARANCEUR Hazy (A) 05/23/2018 1543   LABSPEC 1.014 09/19/2018 1129   PHURINE 5.0 09/19/2018 1129   GLUCOSEU 50 (A) 09/19/2018 1129   HGBUR NEGATIVE 09/19/2018 1129   BILIRUBINUR NEGATIVE 09/19/2018 1129   BILIRUBINUR Negative 05/23/2018 1543   KETONESUR NEGATIVE 09/19/2018 1129   PROTEINUR NEGATIVE 09/19/2018 1129   UROBILINOGEN negative 07/01/2015 1541   UROBILINOGEN 0.2 10/07/2012 1928   NITRITE NEGATIVE 09/19/2018 1129   LEUKOCYTESUR NEGATIVE 09/19/2018 1129   Sepsis Labs Invalid input(s): PROCALCITONIN,  WBC,  LACTICIDVEN Microbiology Recent Results (from the past 240 hour(s))  Culture, blood (Routine x 2)     Status: None   Collection Time: 09/19/18 11:29 AM  Result Value Ref Range Status   Specimen Description BLOOD RIGHT WRIST  Final   Special Requests  Final    BOTTLES DRAWN AEROBIC AND ANAEROBIC Blood Culture adequate volume   Culture   Final    NO GROWTH 5 DAYS Performed at [facility], [address], [place], [place] [address]    Report Status 09/24/2018 FINAL  Final  Novel Coronavirus, NAA (hospital order; send-out to ref lab)     Status: None (Preliminary result)   Collection Time: 09/19/18 11:40 AM  Result Value Ref Range Status   SARS-CoV-2, NAA PENDING NOT DETECTED Incomplete  Respiratory Panel by PCR     Status: Abnormal   Collection Time: 09/19/18 11:41 AM   Result Value Ref Range Status   Adenovirus NOT DETECTED NOT DETECTED Final   Coronavirus 229E NOT DETECTED NOT DETECTED Final    Comment: (NOTE) The Coronavirus on the Respiratory Panel, DOES NOT test for the novel  Coronavirus (2019 nCoV)    Coronavirus HKU1 NOT DETECTED NOT DETECTED Final   Coronavirus NL63 DETECTED (A) NOT DETECTED Final   Coronavirus OC43 NOT DETECTED NOT DETECTED Final   Metapneumovirus NOT DETECTED NOT DETECTED Final   Rhinovirus / Enterovirus NOT DETECTED NOT DETECTED Final   Influenza A NOT DETECTED NOT DETECTED Final   Influenza B NOT DETECTED NOT DETECTED Final   Parainfluenza Virus 1 NOT DETECTED NOT DETECTED Final   Parainfluenza Virus 2 NOT DETECTED NOT DETECTED Final   Parainfluenza Virus 3 NOT DETECTED NOT DETECTED Final   Parainfluenza Virus 4 NOT DETECTED NOT DETECTED Final   Respiratory Syncytial Virus NOT DETECTED NOT DETECTED Final   Bordetella pertussis NOT DETECTED NOT DETECTED Final   Chlamydophila pneumoniae NOT DETECTED NOT DETECTED Final   Mycoplasma pneumoniae NOT DETECTED NOT DETECTED Final    Comment: Performed at [facility], [address] [place], [place] [address]  Culture, blood (Routine x 2)     Status: None   Collection Time: 09/19/18 12:38 PM  Result Value Ref Range Status   Specimen Description BLOOD LEFT HAND  Final   Special Requests   Final    BOTTLES DRAWN AEROBIC AND ANAEROBIC Blood Culture adequate volume   Culture   Final    NO GROWTH 5 DAYS Performed at [facility], [address], [place], [place] [address]    Report Status 09/24/2018 FINAL  Final  MRSA PCR Screening     Status: None   Collection Time: 09/19/18  7:07 PM  Result Value Ref Range Status   MRSA by PCR NEGATIVE NEGATIVE Final    Comment:        The GeneXpert MRSA Assay (FDA approved for NASAL specimens only), is one component of a comprehensive MRSA colonization surveillance program. It is not intended to diagnose MRSA infection nor to  guide or monitor treatment for MRSA infections. Performed at [facility], [place] [address], [place], [place] [address]   Expectorated sputum assessment w rflx to resp cult     Status: None   Collection Time: 09/20/18  9:56 AM  Result Value Ref Range Status   Specimen Description SPUTUM  Final   Special Requests NONE  Final   Sputum evaluation   Final    THIS SPECIMEN IS ACCEPTABLE FOR SPUTUM CULTURE Performed at [facility], [place] [address], [place], [place] [address]    Report Status 09/20/2018 FINAL  Final  Culture, respiratory     Status: None   Collection Time: 09/20/18  9:56 AM  Result Value Ref Range Status   Specimen Description   Final  SPUTUM Performed at [facility], [place] [address] [place], [place] [address]    Special Requests   Final    NONE Reflexed from [id number] Performed at [facility], [place] [address], [place], [place] [address]    Gram Stain   Final    NO WBC SEEN FEW SQUAMOUS EPITHELIAL CELLS PRESENT MODERATE GRAM POSITIVE RODS    Culture   Final    FEW Consistent with normal respiratory flora. Performed at [facility], [place] [address] [place], [place] [address]    Report Status 09/22/2018 FINAL  Final     Time coordinating discharge: Over 30 minutes  SIGNED:   Maryum Batterson J [place] DO  [facility] 09/24/2018, 11:15 AM

## 2018-09-24 NOTE — Discharge Instructions (Signed)
Community-Acquired Pneumonia, Adult °Pneumonia is an infection of the lungs. It causes swelling in the airways of the lungs. Mucus and fluid may also build up inside the airways. °One type of pneumonia can happen while a person is in a hospital. A different type can happen when a person is not in a hospital (community-acquired pneumonia).  °What are the causes? ° °This condition is caused by germs (viruses, bacteria, or fungi). Some types of germs can be passed from one person to another. This can happen when you breathe in droplets from the cough or sneeze of an infected person. °What increases the risk? °You are more likely to develop this condition if you: °· Have a long-term (chronic) disease, such as: °? Chronic obstructive pulmonary disease (COPD). °? Asthma. °? Cystic fibrosis. °? Congestive heart failure. °? Diabetes. °? Kidney disease. °· Have HIV. °· Have sickle cell disease. °· Have had your spleen removed. °· Do not take good care of your teeth and mouth (poor dental hygiene). °· Have a medical condition that increases the risk of breathing in droplets from your own mouth and nose. °· Have a weakened body defense system (immune system). °· Are a smoker. °· Travel to areas where the germs that cause this illness are common. °· Are around certain animals or the places they live. °What are the signs or symptoms? °· A dry cough. °· A wet (productive) cough. °· Fever. °· Sweating. °· Chest pain. This often happens when breathing deeply or coughing. °· Fast breathing or trouble breathing. °· Shortness of breath. °· Shaking chills. °· Feeling tired (fatigue). °· Muscle aches. °How is this treated? °Treatment for this condition depends on many things. Most adults can be treated at home. In some cases, treatment must happen in a hospital. Treatment may include: °· Medicines given by mouth or through an IV tube. °· Being given extra oxygen. °· Respiratory therapy. °In rare cases, treatment for very bad pneumonia  may include: °· Using a machine to help you breathe. °· Having a procedure to remove fluid from around your lungs. °Follow these instructions at home: °Medicines °· Take over-the-counter and prescription medicines only as told by your doctor. °? Only take cough medicine if you are losing sleep. °· If you were prescribed an antibiotic medicine, take it as told by your doctor. Do not stop taking the antibiotic even if you start to feel better. °General instructions ° °· Sleep with your head and neck raised (elevated). You can do this by sleeping in a recliner or by putting a few pillows under your head. °· Rest as needed. Get at least 8 hours of sleep each night. °· Drink enough water to keep your pee (urine) pale yellow. °· Eat a healthy diet that includes plenty of vegetables, fruits, whole grains, low-fat dairy products, and lean protein. °· Do not use any products that contain nicotine or tobacco. These include cigarettes, e-cigarettes, and chewing tobacco. If you need help quitting, ask your doctor. °· Keep all follow-up visits as told by your doctor. This is important. °How is this prevented? °A shot (vaccine) can help prevent pneumonia. Shots are often suggested for: °· People older than 70 years of age. °· People older than 70 years of age who: °? Are having cancer treatment. °? Have long-term (chronic) lung disease. °? Have problems with their body's defense system. °You may also prevent pneumonia if you take these actions: °· Get the flu (influenza) shot every year. °· Go to the dentist as   often as told. °· Wash your hands often. If you cannot use soap and water, use hand sanitizer. °Contact a doctor if: °· You have a fever. °· You lose sleep because your cough medicine does not help. °Get help right away if: °· You are short of breath and it gets worse. °· You have more chest pain. °· Your sickness gets worse. This is very serious if: °? You are an older adult. °? Your body's defense system is weak. °· You  cough up blood. °Summary °· Pneumonia is an infection of the lungs. °· Most adults can be treated at home. Some will need treatment in a hospital. °· Drink enough water to keep your pee pale yellow. °· Get at least 8 hours of sleep each night. °This information is not intended to replace advice given to you by your health care provider. Make sure you discuss any questions you have with your health care provider. °Document Released: 12/06/2007 Document Revised: 02/14/2018 Document Reviewed: 02/14/2018 °Elsevier Interactive Patient Education © 2019 [employer] °Chronic Obstructive Pulmonary Disease Exacerbation °Chronic obstructive pulmonary disease (COPD) is a long-term (chronic) lung problem. In COPD, the flow of air from the lungs is limited. COPD exacerbations are times that breathing gets worse and you need more than your normal treatment. Without treatment, they can be life threatening. If they happen often, your lungs can become more damaged. If your COPD gets worse, your doctor may treat you with: °· Medicines. °· Oxygen. °· Different ways to clear your airway, such as using a mask. °Follow these instructions at home: °Medicines °· Take over-the-counter and prescription medicines only as told by your doctor. °· If you take an antibiotic or steroid medicine, do not stop taking the medicine even if you start to feel better. °· Keep up with shots (vaccinations) as told by your doctor. Be sure to get a yearly (annual) flu shot. °Lifestyle °· Do not smoke. If you need help quitting, ask your doctor. °· Eat healthy foods. °· Exercise regularly. °· Get plenty of sleep. °· Avoid tobacco smoke and other things that can bother your lungs. °· Wash your hands often with soap and water. This will help keep you from getting an infection. If you cannot use soap and water, use hand sanitizer. °· During flu season, avoid areas that are crowded with people. °General instructions °· Drink enough fluid to keep your pee (urine)  clear or pale yellow. Do not do this if your doctor has told you not to. °· Use a cool mist machine (vaporizer). °· If you use oxygen or a machine that turns medicine into a mist (nebulizer), continue to use it as told. °· Follow all instructions for rehabilitation. These are steps you can take to make your body work better. °· Keep all follow-up visits as told by your doctor. This is important. °Contact a doctor if: °· Your COPD symptoms get worse than normal. °Get help right away if: °· You are short of breath and it gets worse. °· You have trouble talking. °· You have chest pain. °· You cough up blood. °· You have a fever. °· You keep throwing up (vomiting). °· You feel weak or you pass out (faint). °· You feel confused. °· You are not able to sleep because of your symptoms. °· You are not able to do daily activities. °Summary °· COPD exacerbations are times that breathing gets worse and you need more treatment than normal. °· COPD exacerbations can be very serious and may   cause your lungs to become more damaged. °· Do not smoke. If you need help quitting, ask your doctor. °· Stay up-to-date on your shots. Get a flu shot every year. °This information is not intended to replace advice given to you by your health care provider. Make sure you discuss any questions you have with your health care provider. °Document Released: 06/08/2011 Document Revised: 07/24/2016 Document Reviewed: 07/24/2016 °Elsevier Interactive Patient Education © 2019 Elsevier Inc. ° °

## 2018-09-24 NOTE — Progress Notes (Signed)
Discharge packet reviewed, patient verbalized understanding. COVID Research officer, [employer], in pt chart. Charge nurse and [facility] aware of d/c, pt taken downstairs via wheelchair by NT.

## 2018-09-24 NOTE — Care Management Important Message (Signed)
Important Message  Patient Details  Name: TRINNITY BREUNIG MRN: [id number] Date of Birth: 1951/04/16   Medicare Important Message Given:  Yes    Kerin Salen 09/24/2018, 12:11 [place] Message  Patient Details  Name: AZLEE MONFORTE MRN: [id number] Date of Birth: [birth year]   Medicare Important Message Given:  Yes    Kerin Salen 09/24/2018, 12:11 PM

## 2018-09-24 NOTE — Progress Notes (Signed)
PT Cancellation Note  Patient Details Name: ORIAH LEINWEBER MRN: [id number] DOB: [birth year]   Cancelled Treatment:    Reason Eval/Treat Not Completed: Medical issues which prohibited therapy - Pt awaiting covid-19 test results. Will check back on pt when test results return.  Julien Girt, PT Acute Rehabilitation Services Pager [phone]  Office [phone]    Roxine Caddy D Elonda Husky 09/24/2018, 9:17 AM

## 2018-09-25 ENCOUNTER — Other Ambulatory Visit: Payer: Self-pay

## 2018-09-25 ENCOUNTER — Telehealth: Payer: Self-pay | Admitting: Family

## 2018-09-25 ENCOUNTER — Other Ambulatory Visit: Payer: Self-pay | Admitting: *Deleted

## 2018-09-25 DIAGNOSIS — J441 Chronic obstructive pulmonary disease with (acute) exacerbation: Secondary | ICD-10-CM

## 2018-09-25 NOTE — Patient Outreach (Signed)
Transition of care call #1. Unable to reach pt today, but left a voicemail and requested a return call.  Stacy Dalton. Myrtie Neither, MSN, [facility] Gerontological Nurse Practitioner [facility] Care Management [phone]

## 2018-09-26 ENCOUNTER — Encounter: Payer: Self-pay | Admitting: *Deleted

## 2018-09-26 ENCOUNTER — Other Ambulatory Visit: Payer: Self-pay | Admitting: *Deleted

## 2018-09-26 NOTE — Patient Outreach (Signed)
Transition of care call. Attempt #2 completed.  Transition of care template completed.  Pt has all meds including newly prescribed antibiotic, Levaquin. She has 3 respiratory medications. She is familiar with the action of her albuterol but needs more education on her [place].  We reviewed COVID19 precautionary measures and limiting exposure and staying home.  Pt has agreed to weekly calls.  Patient was recently discharged from hospital and all medications have been reviewed. Outpatient Encounter Medications as of 09/26/2018  Medication Sig Note  . albuterol (VENTOLIN HFA) 108 (90 Base) MCG/ACT inhaler Inhale 2 puffs into the lungs every 6 (six) hours as needed for wheezing or shortness of breath.    [name] amLODipine-olmesartan (AZOR) 10-40 MG tablet Take 1 tablet by mouth daily.   . Aspirin-Salicylamide-Caffeine (BC HEADACHE POWDER PO) Take 1 Dose by mouth daily as needed.   [name] atorvastatin (LIPITOR) 20 MG tablet Take 1 tablet (20 mg total) by mouth daily.   . DULoxetine (CYMBALTA) 60 MG capsule Take 1 capsule (60 mg total) by mouth daily.   . Fluticasone Furoate-Vilanterol (BREO ELLIPTA IN) Inhale 1 puff into the lungs daily.   [name] HYDROcodone-homatropine (HYCODAN) 5-1.5 MG/5ML syrup Take 5 mLs by mouth every 8 (eight) hours as needed for cough.   [name] levofloxacin (LEVAQUIN) 750 MG tablet Take 1 tablet (750 mg total) by mouth daily for 10 days.   [name] levothyroxine (SYNTHROID, LEVOTHROID) 25 MCG tablet Take 1 tablet (25 mcg total) by mouth daily before breakfast.   . ondansetron (ZOFRAN) 4 MG tablet Take 1 tablet (4 mg total) by mouth every 8 (eight) hours as needed for nausea.   . temazepam (RESTORIL) 30 MG capsule Take 1 capsule (30 mg total) by mouth at bedtime as needed for sleep. 09/26/2018: Only takes as needed.  . traMADol (ULTRAM) 50 MG tablet Take 2 tablets (100 mg total) by mouth every 8 (eight) hours as needed (pain).   [name] umeclidinium bromide (INCRUSE ELLIPTA) 62.5 MCG/INH AEPB  Inhale 1 puff into the lungs daily.   . Vitamin D, Ergocalciferol, (DRISDOL) 1.25 MG (50000 UT) CAPS capsule TAKE 1 CAPSULE (50000 UNITS) BY MOUTH EVERY 7 DAYS (Patient taking differently: Take 50,000 Units by mouth. TAKE 1 CAPSULE (50000 UNITS) BY MOUTH EVERY 7 DAYS)    No facility-administered encounter medications on file as of 09/26/2018.    THN CM Care Plan Problem One     Most Recent Value  Care Plan Problem One  Vulnerable COPD pt to all respiratory borne illnesses.  Role Documenting the Problem One  Care Management Welcome for Problem One  Active  THN Long Term Goal   Pt will not rehospitalize for respiratory illness during the next 31 days.  THN Long Term Goal Start Date  09/26/18  Interventions for Problem One Long Term Goal  Educated pt on staying home during [place] pandemic. Request that family member not come when they are sick, especially the grandchildren.   THN CM Short Term Goal #1   Pt will participate in weekly transition of care calls over the next 31 days.  THN CM Short Term Goal #1 Start Date  09/26/18  Interventions for Short Term Goal #1  Advised of benefit of participation in weekly calls to prevent readmission and identify complications that may predispose to readmission.    THN CM Care Plan Problem Two     Most Recent Value  Care Plan Problem Two  Knowledge deficit regarding respiratory medications.  Role Documenting the  Problem Two  Care Management Coordinator  Care Plan for Problem Two  Active  THN CM Short Term Goal #1   Pt will be able to verbalize what each of her 3 respiratory medications do for her at the end of 30 days.  THN CM Short Term Goal #1 Start Date  09/26/18  Interventions for Short Term Goal #2   Medication reconcilliation completed. Pt has all her medications.     ATTN: Christy - Pt states she needs more Hycodan cough syrup. She is to ask you for this tomorrow during your telephone appt.  Please advise if you want me to follow  up on any particular issues.  Eulah Pont. Myrtie Neither, MSN, [facility] Gerontological Nurse Practitioner [facility] Care Management [phone]

## 2018-09-26 NOTE — Telephone Encounter (Signed)
appt scheduled for telephone visit Pt notified

## 2018-09-27 ENCOUNTER — Other Ambulatory Visit: Payer: Self-pay

## 2018-09-27 ENCOUNTER — Ambulatory Visit (INDEPENDENT_AMBULATORY_CARE_PROVIDER_SITE_OTHER): Payer: Medicare Other | Admitting: Family

## 2018-09-27 ENCOUNTER — Encounter: Payer: Self-pay | Admitting: Family

## 2018-09-27 DIAGNOSIS — J441 Chronic obstructive pulmonary disease with (acute) exacerbation: Secondary | ICD-10-CM

## 2018-09-27 DIAGNOSIS — J449 Chronic obstructive pulmonary disease, unspecified: Secondary | ICD-10-CM

## 2018-09-27 DIAGNOSIS — Z09 Encounter for follow-up examination after completed treatment for conditions other than malignant neoplasm: Secondary | ICD-10-CM

## 2018-09-27 DIAGNOSIS — R05 Cough: Secondary | ICD-10-CM

## 2018-09-27 DIAGNOSIS — J189 Pneumonia, unspecified organism: Secondary | ICD-10-CM

## 2018-09-27 DIAGNOSIS — R053 Chronic cough: Secondary | ICD-10-CM

## 2018-09-27 LAB — NOVEL CORONAVIRUS, NAA (HOSP ORDER, SEND-OUT TO REF LAB; TAT 18-24 HRS): SARS-CoV-2, NAA: NOT DETECTED

## 2018-09-27 MED ORDER — HYDROCODONE-HOMATROPINE 5-1.5 MG/5ML PO SYRP: 5.0000 mL | ORAL_SOLUTION | Freq: Three times a day (TID) | ORAL | 0 refills | Status: DC | PRN

## 2018-09-27 NOTE — Progress Notes (Addendum)
Virtual Visit via telephone Note  I connected with Stacy Dalton on 09/27/18 at 10:22 AM by telephone and verified that I am speaking with the correct person using two identifiers. Stacy Dalton is currently located at home and no one  is currently with her during visit. The provider, Evelina Dun, FNP is located in their office at time of visit.  I discussed the limitations, risks, security and privacy concerns of performing an evaluation and management service by telephone and the availability of in person appointments. I also discussed with the patient that there may be a patient responsible charge related to this service. The patient expressed understanding and agreed to proceed.   History and Present Illness:  Pt was admitted to the hospital on 09/19/18 for Cough, SOB, bilateral pneumonia, COPD. When she was first presented she had O2 sat of low 80's, tachycardia, 103 fever. She was treated for sepsis.   Her COVID test is still pending.  She was discharged on 09/24/18 on Levaquin 750 mg for 10 days and albuterol inhaler. She states she was feeling, better, but states yesterday she started feeling slightly worse. She has [place] checking on patient.  Cough  This is a recurrent problem. The current episode started 1 to 4 weeks ago. The problem has been waxing and waning. The problem occurs every few minutes. The cough is non-productive. Associated symptoms include headaches, myalgias, shortness of breath and wheezing. Pertinent negatives include no chills, ear congestion, ear pain, fever (75F yesterday) or rhinorrhea. She has tried rest (Levaquin) for the symptoms. Her past medical history is significant for COPD and pneumonia.      Review of Systems  Constitutional: Negative for chills and fever (75F yesterday).  HENT: Negative for ear pain and rhinorrhea.   Respiratory: Positive for cough, shortness of breath and wheezing.   Musculoskeletal: Positive for myalgias.   Neurological: Positive for headaches.     Observations/Objective: Sounds weak, no SOB or distress noted on phone  Assessment and Plan: 1. Chronic obstructive pulmonary disease, unspecified COPD type ([place]) - HYDROcodone-homatropine (HYCODAN) 5-1.5 MG/5ML syrup; Take 5 mLs by mouth every 8 (eight) hours as needed for cough.  Dispense: 473 mL; Refill: 0  2. Chronic cough - HYDROcodone-homatropine (HYCODAN) 5-1.5 MG/5ML syrup; Take 5 mLs by mouth every 8 (eight) hours as needed for cough.  Dispense: 473 mL; Refill: 0  3. Hospital discharge follow-up  4. Multifocal pneumonia  5. COPD exacerbation ([place])   COVID still pending  Will make patient appt for 09/30/18, hopefully her COVID will be back. She need to repeat her chest x-ray and lab work  Continue Levaquin 750 mg daily and albuterol, Breo Long discussion with patient to discuss red flags that she would need to go to ED. She agrees if her SOB worsens she will go to ED RTO in 09/30/18     I discussed the assessment and treatment plan with the patient. The patient was provided an opportunity to ask questions and all were answered. The patient agreed with the plan and demonstrated an understanding of the instructions.   The patient was advised to call back or seek an in-person evaluation if the symptoms worsen or if the condition fails to improve as anticipated.  The above assessment and management plan was discussed with the patient. The patient verbalized understanding of and has agreed to the management plan. Patient is aware to call the clinic if symptoms persist or worsen. Patient is aware when to return to the  clinic for a follow-up visit. Patient educated on when it is appropriate to go to the emergency department.    Called ended  10:42 AM, I provided 20 minutes of non-face-to-face time during this encounter.    Evelina Dun, FNP

## 2018-09-30 ENCOUNTER — Encounter: Payer: Self-pay | Admitting: Family

## 2018-09-30 ENCOUNTER — Ambulatory Visit (INDEPENDENT_AMBULATORY_CARE_PROVIDER_SITE_OTHER): Payer: Medicare Other

## 2018-09-30 ENCOUNTER — Ambulatory Visit (INDEPENDENT_AMBULATORY_CARE_PROVIDER_SITE_OTHER): Payer: Medicare Other | Admitting: Family

## 2018-09-30 ENCOUNTER — Other Ambulatory Visit: Payer: Self-pay

## 2018-09-30 VITALS — BP 137/77 | HR 84 | Temp 98.9°F | Ht 61.0 in | Wt 104.2 lb

## 2018-09-30 DIAGNOSIS — J189 Pneumonia, unspecified organism: Secondary | ICD-10-CM | POA: Diagnosis not present

## 2018-09-30 DIAGNOSIS — Z09 Encounter for follow-up examination after completed treatment for conditions other than malignant neoplasm: Secondary | ICD-10-CM

## 2018-09-30 DIAGNOSIS — J988 Other specified respiratory disorders: Secondary | ICD-10-CM

## 2018-09-30 DIAGNOSIS — J449 Chronic obstructive pulmonary disease, unspecified: Secondary | ICD-10-CM

## 2018-09-30 LAB — CMP14+EGFR
ALT: 10 IU/L (ref 0–32)
AST: 17 IU/L (ref 0–40)
Albumin/Globulin Ratio: 2.3 — ABNORMAL HIGH (ref 1.2–2.2)
Albumin: 4.5 g/dL (ref 3.8–4.8)
Alkaline Phosphatase: 96 IU/L (ref 39–117)
BUN/Creatinine Ratio: 18 (ref 12–28)
BUN: 20 mg/dL (ref 8–27)
Bilirubin Total: 0.2 mg/dL (ref 0.0–1.2)
CO2: 26 mmol/L (ref 20–29)
Calcium: 10 mg/dL (ref 8.7–10.3)
Chloride: 92 mmol/L — ABNORMAL LOW (ref 96–106)
Creatinine, Ser: 1.14 mg/dL — ABNORMAL HIGH (ref 0.57–1.00)
GFR calc Af Amer: 56 mL/min/{1.73_m2} — ABNORMAL LOW (ref >59)
GFR calc non Af Amer: 49 mL/min/{1.73_m2} — ABNORMAL LOW (ref >59)
Globulin, Total: 2 g/dL (ref 1.5–4.5)
Glucose: 85 mg/dL (ref 65–99)
Potassium: 5 mmol/L (ref 3.5–5.2)
Sodium: 136 mmol/L (ref 134–144)
Total Protein: 6.5 g/dL (ref 6.0–8.5)

## 2018-09-30 LAB — CBC WITH DIFFERENTIAL/PLATELET
Basophils Absolute: 0.1 10*3/uL (ref 0.0–0.2)
Basos: 1 %
EOS (ABSOLUTE): 0.3 10*3/uL (ref 0.0–0.4)
Eos: 3 %
Hematocrit: 34.6 % (ref 34.0–46.6)
Hemoglobin: 11.4 g/dL (ref 11.1–15.9)
Immature Grans (Abs): 0.3 10*3/uL — ABNORMAL HIGH (ref 0.0–0.1)
Immature Granulocytes: 3 %
Lymphocytes Absolute: 1.5 10*3/uL (ref 0.7–3.1)
Lymphs: 18 %
MCH: 27.9 pg (ref 26.6–33.0)
MCHC: 32.9 g/dL (ref 31.5–35.7)
MCV: 85 fL (ref 79–97)
Monocytes Absolute: 1 10*3/uL — ABNORMAL HIGH (ref 0.1–0.9)
Monocytes: 12 %
Neutrophils Absolute: 5.2 10*3/uL (ref 1.4–7.0)
Neutrophils: 63 %
Platelets: 661 10*3/uL — ABNORMAL HIGH (ref 150–450)
RBC: 4.08 x10E6/uL (ref 3.77–5.28)
RDW: 13.1 % (ref 11.7–15.4)
WBC: 8.3 10*3/uL (ref 3.4–10.8)

## 2018-09-30 NOTE — Progress Notes (Signed)
   Subjective:    Patient ID: Stacy Dalton, female    DOB: Nov 07, 1950, 68 y.o.   MRN: [id number]  Chief Complaint  Patient presents with  . COPD recheck    HPI PT presents to the office today to recheck Bilateral CAP. She states she continues to feel weak, but is gradually getting her strength back. She is afebrile today, and denies any fever at home, edema, or palpitations.   States she continues to have intermittent dry nonproductive cough, with mild SOB with exertion.   She was discharged on Levaquin 750 mg daily and is still taking this.    Review of Systems  Constitutional: Positive for fatigue. Negative for chills and fever.  Respiratory: Positive for cough and shortness of breath.   All other systems reviewed and are negative.      Objective:   Physical Exam Vitals signs reviewed.  Constitutional:      General: She is not in acute distress.    Appearance: She is well-developed.     Comments: Mild weakness  HENT:     Head: Normocephalic and atraumatic.     Right Ear: Tympanic membrane normal.     Left Ear: Tympanic membrane normal.  Eyes:     Pupils: Pupils are equal, round, and reactive to light.  Neck:     Musculoskeletal: Normal range of motion and neck supple.     Thyroid: No thyromegaly.  Cardiovascular:     Rate and Rhythm: Normal rate and regular rhythm.     Heart sounds: Normal heart sounds. No murmur.  Pulmonary:     Effort: Pulmonary effort is normal. No respiratory distress.     Breath sounds: Normal breath sounds. No wheezing.  Abdominal:     General: Bowel sounds are normal. There is no distension.     Palpations: Abdomen is soft.     Tenderness: There is no abdominal tenderness.  Musculoskeletal: Normal range of motion.        General: No tenderness.  Skin:    General: Skin is warm and dry.  Neurological:     Mental Status: She is alert and oriented to person, place, and time.     Cranial Nerves: No cranial nerve deficit.     Deep  Tendon Reflexes: Reflexes are normal and symmetric.  Psychiatric:        Behavior: Behavior normal.        Thought Content: Thought content normal.        Judgment: Judgment normal.     Chest x-ray- Improved, no pneumonia noted, Preliminary reading by Evelina Dun, FNP [facility]   BP 137/77   Pulse 84   Temp 98.9 F (37.2 C) (Oral)   Ht '5\' 1"'$  (1.549 m)   Wt 104 lb 3.2 oz (47.3 kg)   BMI 19.69 kg/m      Assessment & Plan:  KELSE PLOCH comes in today with chief complaint of COPD recheck; Hospitalization Follow-up; and Pneumonia   Diagnosis and orders addressed:  1. Respiratory infection - DG Chest 2 View; Future - CMP14+EGFR - CBC with Differential/Platelet  2. Chronic obstructive pulmonary disease, unspecified COPD type ([place]) - CMP14+EGFR - CBC with Differential/Platelet  3. Community acquired pneumonia, unspecified laterality Continue Levaquin  - CMP14+EGFR - CBC with Differential/Platelet  4. Hospital discharge follow-up - CMP14+EGFR - CBC with Differential/Platelet   Labs pending Health Maintenance reviewed Diet and exercise encouraged  Follow up plan: Keep chronic follow up   Evelina Dun, FNP

## 2018-09-30 NOTE — Patient Instructions (Signed)
Community-Acquired Pneumonia, Adult  Pneumonia is an infection of the lungs. It causes swelling in the airways of the lungs. Mucus and fluid may also build up inside the airways.  One type of pneumonia can happen while a person is in a hospital. A different type can happen when a person is not in a hospital (community-acquired pneumonia).   What are the causes?    This condition is caused by germs (viruses, bacteria, or fungi). Some types of germs can be passed from one person to another. This can happen when you breathe in droplets from the cough or sneeze of an infected person.  What increases the risk?  You are more likely to develop this condition if you:   Have a long-term (chronic) disease, such as:  ? Chronic obstructive pulmonary disease (COPD).  ? Asthma.  ? Cystic fibrosis.  ? Congestive heart failure.  ? Diabetes.  ? Kidney disease.   Have HIV.   Have sickle cell disease.   Have had your spleen removed.   Do not take good care of your teeth and mouth (poor dental hygiene).   Have a medical condition that increases the risk of breathing in droplets from your own mouth and nose.   Have a weakened body defense system (immune system).   Are a smoker.   Travel to areas where the germs that cause this illness are common.   Are around certain animals or the places they live.  What are the signs or symptoms?   A dry cough.   A wet (productive) cough.   Fever.   Sweating.   Chest pain. This often happens when breathing deeply or coughing.   Fast breathing or trouble breathing.   Shortness of breath.   Shaking chills.   Feeling tired (fatigue).   Muscle aches.  How is this treated?  Treatment for this condition depends on many things. Most adults can be treated at home. In some cases, treatment must happen in a hospital. Treatment may include:   Medicines given by mouth or through an IV tube.   Being given extra oxygen.   Respiratory therapy.  In rare cases, treatment for very bad pneumonia  may include:   Using a machine to help you breathe.   Having a procedure to remove fluid from around your lungs.  Follow these instructions at home:  Medicines   Take over-the-counter and prescription medicines only as told by your doctor.  ? Only take cough medicine if you are losing sleep.   If you were prescribed an antibiotic medicine, take it as told by your doctor. Do not stop taking the antibiotic even if you start to feel better.  General instructions     Sleep with your head and neck raised (elevated). You can do this by sleeping in a recliner or by putting a few pillows under your head.   Rest as needed. Get at least 8 hours of sleep each night.   Drink enough water to keep your pee (urine) pale yellow.   Eat a healthy diet that includes plenty of vegetables, fruits, whole grains, low-fat dairy products, and lean protein.   Do not use any products that contain nicotine or tobacco. These include cigarettes, e-cigarettes, and chewing tobacco. If you need help quitting, ask your doctor.   Keep all follow-up visits as told by your doctor. This is important.  How is this prevented?  A shot (vaccine) can help prevent pneumonia. Shots are often suggested for:   People   older than 70 years of age.   People older than 70 years of age who:  ? Are having cancer treatment.  ? Have long-term (chronic) lung disease.  ? Have problems with their body's defense system.  You may also prevent pneumonia if you take these actions:   Get the flu (influenza) shot every year.   Go to the dentist as often as told.   Wash your hands often. If you cannot use soap and water, use hand sanitizer.  Contact a doctor if:   You have a fever.   You lose sleep because your cough medicine does not help.  Get help right away if:   You are short of breath and it gets worse.   You have more chest pain.   Your sickness gets worse. This is very serious if:  ? You are an older adult.  ? Your body's defense system is weak.   You  cough up blood.  Summary   Pneumonia is an infection of the lungs.   Most adults can be treated at home. Some will need treatment in a hospital.   Drink enough water to keep your pee pale yellow.   Get at least 8 hours of sleep each night.  This information is not intended to replace advice given to you by your health care provider. Make sure you discuss any questions you have with your health care provider.  Document Released: 12/06/2007 Document Revised: 02/14/2018 Document Reviewed: 02/14/2018  Elsevier Interactive Patient Education  2019 [employer]vier Inc[employer]

## 2018-10-04 ENCOUNTER — Other Ambulatory Visit: Payer: Self-pay

## 2018-10-04 ENCOUNTER — Other Ambulatory Visit: Payer: Self-pay | Admitting: *Deleted

## 2018-10-04 NOTE — Patient Outreach (Signed)
Transition of care call, week 2. Pt was unable to speak with me and asked if I would call her on Monday.  Eulah Pont. Myrtie Neither, MSN, [facility] Gerontological Nurse Practitioner [facility] Care Management [phone]

## 2018-10-07 ENCOUNTER — Other Ambulatory Visit: Payer: Self-pay | Admitting: *Deleted

## 2018-10-07 ENCOUNTER — Other Ambulatory Visit: Payer: Self-pay

## 2018-10-07 NOTE — Patient Outreach (Signed)
Transition of care call. Mrs. Driver reports her breathing status is back to her normal. She does however report an incident with walking the dog yesterday and the dog suddenly lunged off after something and it jerked her whole body especially her L arm. She did not fall. Today, she is very sore and can hardly lift her arms.   She is now taking care of her grandchildren again and they are all well. She enjoys this.  Today, we discussed what her inhalers are for, what they do and how often she should be taking them. We also talked about her COPD Action Plan which she understands well.  We also discussed what she can do for her aches and pains. Suggested cold compress to shoulder to reduce inflammation and to take APAP as needed. If her pain doesn't start feeling better by next week. She should see [name] for evaluation.  I will call her again in one week.  Eulah Pont. Myrtie Neither, MSN, [facility] Gerontological Nurse Practitioner [facility] Care Management [phone]

## 2018-10-11 ENCOUNTER — Ambulatory Visit: Payer: Self-pay | Admitting: *Deleted

## 2018-10-15 ENCOUNTER — Telehealth: Payer: Self-pay | Admitting: Family

## 2018-10-15 MED ORDER — LEVOTHYROXINE SODIUM 25 MCG PO TABS: 25.0000 ug | ORAL_TABLET | Freq: Every day | ORAL | 1 refills | Status: DC

## 2018-10-15 MED ORDER — LEVOTHYROXINE SODIUM 25 MCG PO TABS: 25.0000 ug | ORAL_TABLET | Freq: Every day | ORAL | 0 refills | Status: DC

## 2018-10-15 NOTE — Telephone Encounter (Signed)
PT states that she is out of her levothyroxine (SYNTHROID, LEVOTHROID) 25 MCG tablet she would like a weeks worth sent [facility] [address] and the [facility]

## 2018-10-15 NOTE — Telephone Encounter (Signed)
NA refill sent to pharmacy

## 2018-10-18 ENCOUNTER — Other Ambulatory Visit: Payer: Self-pay | Admitting: *Deleted

## 2018-10-18 ENCOUNTER — Encounter: Payer: Self-pay | Admitting: *Deleted

## 2018-10-18 NOTE — Patient Outreach (Signed)
[facility]) Care Management [place] Telephone Outreach, Transition of Care   10/18/2018  Stacy Dalton [birth year] [id number]  Successful telephone outreach attempt for transition of care to Stacy Dalton, in coverage for primary [facility] RN CM, Deloria Lair.  HIPAA/ identity verified during phone call today.  Today, patient reports that she is "doing real good," and she denies pain, new recent falls and sounds to be in no distress throughout phone call today.  Patient further reports:  -- no recent changes around medications: denies concerns around medications and states she is taking all as prescribed; reports that she consistently uses Breo inhaler every day, but does not use Ellipta inhaler every day- states only uses occasionally; denies recent use of rescue inhaler, stating "I haven't needed it- my breathing has been much better."  -- No recent provider appointments due to community precautions aorund COVID-19; confirms that she is following recommended community precautions around COVID-19 for staying at home and social distancing; patient is able to verbalize accurate signs/ symptoms of possible COVID-19 exposure and appropriate action plan to contact PCP should she develop signs/ symptoms.  Today, patient denies signs/ symptoms concerning for COVID-19.    -- reports ongoing activity intolerance and increased shortness of breath with activity; states that recently when this happens, symptoms have been resolved with rest alone; encouraged patient to use rescue inhaler as needed, and she verbalizes understanding and stated she would do, again assuring me that she has not felt that she needed to use recently.  Patient reports that she believes she is "breathing normally" and at her baseline.  Patient denies further issues, concerns, or problems today.  I confirmed that patient has primary [facility] RN CM direct phone number, the main [facility] CM office phone number, and the  [facility] CM 24-hour nurse advice phone number should issues arise prior to next scheduled [place] outreach by phone with primary [facility] RN CM next week.    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  Role Documenting the Problem One  Care Management [place] for Problem One  Active  [facility] Long Term Goal   Pt will not rehospitalize for respiratory illness during the next 31 days.  [facility] Long Term Goal Start Date  09/26/18  Interventions for Problem One Long Term Goal  Discussed with patient current clinical condition and confirmed that she has no concerns around clinical condition,  discussed and confirmed that patient is adhering to recommended community precautions around COVID-19,  discussed with her action plan should she develop concerns that she has been exposed to COVID   [facility] CM Short Term Goal #1   Pt will participate in weekly transition of care calls over the next 31 days.  [facility] CM Short Term Goal #1 Start Date  09/26/18  Interventions for Short Term Goal #1  Transition of Care call completed with patient today    [facility] CM Care Plan Problem Two     Most Recent Value  Care Plan Problem Two  Knowledge deficit regarding respiratory medications.  Role Documenting the Problem Two  Care Management Coordinator  Care Plan for Problem Two  Active  [facility] CM Short Term Goal #1   Pt will be able to verbalize what each of her 3  respiratory medications do for her at the end of 30 days.  [facility] CM Short Term Goal #1 Start Date  09/26/18  Interventions for Short Term Goal #2   Discussed with patient her current use of inhalers,  confirmed that she has not needed to use her rescue inhaler this week     Oneta Rack, RN, BSN, [place] Care Management  [phone]

## 2018-10-24 ENCOUNTER — Other Ambulatory Visit: Payer: Self-pay | Admitting: Family

## 2018-10-24 DIAGNOSIS — R053 Chronic cough: Secondary | ICD-10-CM

## 2018-10-24 DIAGNOSIS — J449 Chronic obstructive pulmonary disease, unspecified: Secondary | ICD-10-CM

## 2018-10-24 DIAGNOSIS — R05 Cough: Secondary | ICD-10-CM

## 2018-10-24 NOTE — Telephone Encounter (Signed)
PT is needing a refill on HYDROcodone-homatropine (HYCODAN) 5-1.5 MG/5ML syrup  [id number] - [address] [place], [place]

## 2018-10-24 NOTE — Telephone Encounter (Signed)
Please advise 

## 2018-10-25 ENCOUNTER — Other Ambulatory Visit: Payer: Self-pay | Admitting: Family

## 2018-10-25 ENCOUNTER — Encounter: Payer: Self-pay | Admitting: *Deleted

## 2018-10-25 ENCOUNTER — Other Ambulatory Visit: Payer: Self-pay | Admitting: *Deleted

## 2018-10-25 ENCOUNTER — Other Ambulatory Visit: Payer: Self-pay

## 2018-10-25 DIAGNOSIS — R053 Chronic cough: Secondary | ICD-10-CM

## 2018-10-25 DIAGNOSIS — R05 Cough: Secondary | ICD-10-CM

## 2018-10-25 DIAGNOSIS — J449 Chronic obstructive pulmonary disease, unspecified: Secondary | ICD-10-CM

## 2018-10-25 MED ORDER — HYDROCODONE-HOMATROPINE 5-1.5 MG/5ML PO SYRP: 5.0000 mL | ORAL_SOLUTION | Freq: Three times a day (TID) | ORAL | 0 refills | Status: DC | PRN

## 2018-10-25 NOTE — Patient Outreach (Signed)
Unsuccessful transiton of care outreach. Unable to leave a message. Will send unsuccessful outreach letter and ask for a return call. Other wise will call again next Friday for final transition of care call.  Eulah Pont. Myrtie Neither, MSN, [facility] Gerontological Nurse Practitioner [facility] Care Management [phone]

## 2018-10-30 ENCOUNTER — Ambulatory Visit: Payer: Self-pay | Admitting: *Deleted

## 2018-11-01 ENCOUNTER — Ambulatory Visit (INDEPENDENT_AMBULATORY_CARE_PROVIDER_SITE_OTHER): Payer: Medicare Other | Admitting: Family

## 2018-11-01 ENCOUNTER — Encounter: Payer: Self-pay | Admitting: Family

## 2018-11-01 ENCOUNTER — Other Ambulatory Visit: Payer: Self-pay

## 2018-11-01 DIAGNOSIS — A084 Viral intestinal infection, unspecified: Secondary | ICD-10-CM | POA: Diagnosis not present

## 2018-11-01 MED ORDER — ONDANSETRON HCL 4 MG PO TABS: 4.0000 mg | ORAL_TABLET | Freq: Three times a day (TID) | ORAL | 3 refills | Status: DC | PRN

## 2018-11-01 NOTE — Progress Notes (Signed)
   Virtual Visit via telephone Note  I connected with Stacy Dalton on 11/01/18 at 4:52 pm by telephone and verified that I am speaking with the correct person using two identifiers. Stacy Dalton is currently located at home and no one is currently with her during visit. The provider, Evelina Dun, FNP is located in their office at time of visit.  I discussed the limitations, risks, security and privacy concerns of performing an evaluation and management service by telephone and the availability of in person appointments. I also discussed with the patient that there may be a patient responsible charge related to this service. The patient expressed understanding and agreed to proceed.   History and Present Illness:  Emesis   This is a new problem. The current episode started today. The problem occurs 5 to 10 times per day. The problem has been gradually improving. Maximum temperature: 99.3 f. Associated symptoms include diarrhea, a fever and headaches. Pertinent negatives include no chest pain, chills or dizziness. She has tried acetaminophen, increased fluids, sleep and bed rest for the symptoms. The treatment provided mild relief.      Review of Systems  Constitutional: Positive for fever. Negative for chills.  Cardiovascular: Negative for chest pain.  Gastrointestinal: Positive for diarrhea and vomiting.  Neurological: Positive for headaches. Negative for dizziness.  All other systems reviewed and are negative.    Observations/Objective: No SOB or distress noted   Assessment and Plan: Stacy Dalton comes in today with chief complaint of No chief complaint on file.   Diagnosis and orders addressed:  1. Viral gastroenteritis Force fluids Bland diet At this time her symptoms seem stable and are gradually improving. Will hold off on going to ED. If weakness or unable to keep fluids down go to ED Take zofran as needed RTO as needed or if symptoms worsen or do not  improve  - ondansetron (ZOFRAN) 4 MG tablet; Take 1 tablet (4 mg total) by mouth every 8 (eight) hours as needed for nausea.  Dispense: 60 tablet; Refill: 3       I discussed the assessment and treatment plan with the patient. The patient was provided an opportunity to ask questions and all were answered. The patient agreed with the plan and demonstrated an understanding of the instructions.   The patient was advised to call back or seek an in-person evaluation if the symptoms worsen or if the condition fails to improve as anticipated.  The above assessment and management plan was discussed with the patient. The patient verbalized understanding of and has agreed to the management plan. Patient is aware to call the clinic if symptoms persist or worsen. Patient is aware when to return to the clinic for a follow-up visit. Patient educated on when it is appropriate to go to the emergency department.   Time call ended:  4:49pm  I provided 7 minutes of non-face-to-face time during this encounter.    Evelina Dun, FNP

## 2018-11-06 ENCOUNTER — Other Ambulatory Visit: Payer: Self-pay | Admitting: Family

## 2018-11-06 DIAGNOSIS — I10 Essential (primary) hypertension: Secondary | ICD-10-CM

## 2018-11-20 ENCOUNTER — Telehealth: Payer: Self-pay | Admitting: Family

## 2018-11-20 DIAGNOSIS — J449 Chronic obstructive pulmonary disease, unspecified: Secondary | ICD-10-CM

## 2018-11-20 DIAGNOSIS — R053 Chronic cough: Secondary | ICD-10-CM

## 2018-11-20 DIAGNOSIS — R05 Cough: Secondary | ICD-10-CM

## 2018-11-20 NOTE — Telephone Encounter (Signed)
Patient was sent to pulmonary and has a COPD diagnosis.    Please advise on recurrent request for hycodan refill.

## 2018-11-20 NOTE — Telephone Encounter (Signed)
Is controlled, will have to await for pcp.

## 2018-11-20 NOTE — Telephone Encounter (Signed)
Refill for HYDROcodone-homatropine (HYCODAN) 5-1.5 MG/5ML syrup please send to [facility] [place] [facility]. Pt is aware that Alyse Low is not here today.

## 2018-11-20 NOTE — Telephone Encounter (Signed)
For pcp review

## 2018-11-21 MED ORDER — HYDROCODONE-HOMATROPINE 5-1.5 MG/5ML PO SYRP: 5.0000 mL | ORAL_SOLUTION | Freq: Three times a day (TID) | ORAL | 0 refills | Status: DC | PRN

## 2018-11-21 NOTE — Telephone Encounter (Signed)
Prescription sent to pharmacy.

## 2018-11-22 ENCOUNTER — Other Ambulatory Visit: Payer: Self-pay | Admitting: *Deleted

## 2018-11-22 ENCOUNTER — Encounter: Payer: Self-pay | Admitting: *Deleted

## 2018-11-22 NOTE — Patient Outreach (Signed)
Last transition of care call into 2nd month. Unable to contact pt. Was unable to contact pt on last call and sent unsuccessful outreach letter and she did not respond.  I will close pt case now as I am unable to reach her. She did progress through her care plan in the first month.  I will send both [name] and [name], case closure letters.  Eulah Pont. Myrtie Neither, MSN, [facility] Gerontological Nurse Practitioner [facility] Care Management [phone]

## 2018-12-02 ENCOUNTER — Other Ambulatory Visit: Payer: Self-pay

## 2018-12-04 ENCOUNTER — Other Ambulatory Visit: Payer: Self-pay

## 2018-12-05 ENCOUNTER — Ambulatory Visit (INDEPENDENT_AMBULATORY_CARE_PROVIDER_SITE_OTHER): Payer: Medicare Other | Admitting: Family

## 2018-12-05 ENCOUNTER — Encounter: Payer: Self-pay | Admitting: Family

## 2018-12-05 DIAGNOSIS — J441 Chronic obstructive pulmonary disease with (acute) exacerbation: Secondary | ICD-10-CM | POA: Diagnosis not present

## 2018-12-05 DIAGNOSIS — I10 Essential (primary) hypertension: Secondary | ICD-10-CM | POA: Diagnosis not present

## 2018-12-05 DIAGNOSIS — F419 Anxiety disorder, unspecified: Secondary | ICD-10-CM

## 2018-12-05 DIAGNOSIS — G8929 Other chronic pain: Secondary | ICD-10-CM

## 2018-12-05 DIAGNOSIS — F1120 Opioid dependence, uncomplicated: Secondary | ICD-10-CM

## 2018-12-05 DIAGNOSIS — K5909 Other constipation: Secondary | ICD-10-CM | POA: Diagnosis not present

## 2018-12-05 DIAGNOSIS — E785 Hyperlipidemia, unspecified: Secondary | ICD-10-CM

## 2018-12-05 DIAGNOSIS — M546 Pain in thoracic spine: Secondary | ICD-10-CM

## 2018-12-05 DIAGNOSIS — G4700 Insomnia, unspecified: Secondary | ICD-10-CM | POA: Diagnosis not present

## 2018-12-05 DIAGNOSIS — F321 Major depressive disorder, single episode, moderate: Secondary | ICD-10-CM

## 2018-12-05 DIAGNOSIS — E039 Hypothyroidism, unspecified: Secondary | ICD-10-CM

## 2018-12-05 DIAGNOSIS — K219 Gastro-esophageal reflux disease without esophagitis: Secondary | ICD-10-CM

## 2018-12-05 DIAGNOSIS — G894 Chronic pain syndrome: Secondary | ICD-10-CM

## 2018-12-05 DIAGNOSIS — Z0289 Encounter for other administrative examinations: Secondary | ICD-10-CM | POA: Diagnosis not present

## 2018-12-05 MED ORDER — TEMAZEPAM 30 MG PO CAPS: 30.0000 mg | ORAL_CAPSULE | Freq: Every evening | ORAL | 3 refills | Status: DC | PRN

## 2018-12-05 MED ORDER — TRAMADOL HCL 50 MG PO TABS: 100.0000 mg | ORAL_TABLET | Freq: Three times a day (TID) | ORAL | 3 refills | Status: DC | PRN

## 2018-12-05 NOTE — Progress Notes (Signed)
Virtual Visit via telephone Note  I connected with Stacy Dalton on 12/05/18 at 11:44 AM by telephone and verified that I am speaking with the correct person using two identifiers. Stacy Dalton is currently located at home  and no one is currently with her during visit. The provider, Evelina Dun, FNP is located in their office at time of visit.  I discussed the limitations, risks, security and privacy concerns of performing an evaluation and management service by telephone and the availability of in person appointments. I also discussed with the patient that there may be a patient responsible charge related to this service. The patient expressed understanding and agreed to proceed.   History and Present Illness:  Pt calls the office today for chronic follow up. PT has pulmonologists for COPD and chronic cough. Hypertension  This is a chronic problem. The current episode started more than 1 year ago. The problem has been resolved (130/80) since onset. The problem is controlled. Associated symptoms include anxiety, malaise/fatigue and shortness of breath. Pertinent negatives include no peripheral edema. Risk factors for coronary artery disease include dyslipidemia and sedentary lifestyle. The current treatment provides moderate improvement. There is no history of kidney disease, CAD/MI or heart failure. Identifiable causes of hypertension include a thyroid problem.  Constipation  This is a chronic problem. The current episode started more than 1 year ago. The problem has been waxing and waning since onset. Her stool frequency is 4 to 5 times per week. Associated symptoms include back pain. She has tried laxatives for the symptoms.  Gastroesophageal Reflux  She complains of belching and heartburn. This is a chronic problem. The current episode started more than 1 year ago. The problem occurs occasionally. The problem has been waxing and waning. Associated symptoms include fatigue. She has  tried a PPI for the symptoms. The treatment provided moderate relief.  Hyperlipidemia  This is a chronic problem. The current episode started more than 1 year ago. The problem is uncontrolled. Recent lipid tests were reviewed and are high. Associated symptoms include shortness of breath. Current antihyperlipidemic treatment includes statins. The current treatment provides moderate improvement of lipids. Risk factors for coronary artery disease include dyslipidemia, hypertension and a sedentary lifestyle.  Depression         This is a chronic problem.  The current episode started more than 1 year ago.   The onset quality is gradual.   The problem occurs intermittently.  Associated symptoms include decreased concentration, fatigue, helplessness, insomnia, irritable, restlessness, decreased interest and sad.  Associated symptoms include no hopelessness.  Past treatments include SNRIs - Serotonin and norepinephrine reuptake inhibitors.  Past medical history includes thyroid problem and anxiety.   Anxiety  Presents for follow-up visit. Symptoms include decreased concentration, excessive worry, insomnia, irritability, nervous/anxious behavior, restlessness and shortness of breath.    Insomnia  Primary symptoms: difficulty falling asleep, malaise/fatigue.  The current episode started more than one year. The onset quality is gradual. The problem occurs intermittently. PMH includes: depression.  Back Pain  This is a chronic problem. The current episode started more than 1 year ago. The problem occurs constantly. The problem has been waxing and waning since onset. The pain is present in the lumbar spine. The quality of the pain is described as aching. The pain is at a severity of 8/10. The pain is moderate. She has tried analgesics for the symptoms. The treatment provided mild relief.  Thyroid Problem  Presents for follow-up visit. Symptoms include anxiety, constipation  and fatigue. The symptoms have been  stable. Her past medical history is significant for hyperlipidemia. There is no history of heart failure.  COPD Taking Breo daily and using albuterol as needed. Continues to have SOB with walking.    Current opioids rx- Ultram 100 every 8 hours # meds rx- 180 Effectiveness of current meds-Continues to have pain of 8 out 10 Adverse reactions form pain meds-Constipation  Morphine equivalent- 45.26 mme/day  Pill count performed-No Last drug screen - 05/24/19 ( high risk q68m, moderate risk q84m, low risk yearly ) Urine drug screen today- No Was the [place] reviewed- Yes  If yes were their any concerning findings? - No, other getting Hydromet Syrup and Temazepam. We discussed stopping Hydromet Syrup today.   No flowsheet data found.   Pain contract signed on: 05/21/18   Review of Systems  Constitutional: Positive for fatigue, irritability and malaise/fatigue.  Respiratory: Positive for shortness of breath.   Gastrointestinal: Positive for constipation and heartburn.  Musculoskeletal: Positive for back pain.  Psychiatric/Behavioral: Positive for decreased concentration and depression. The patient is nervous/anxious and has insomnia.      Observations/Objective: No SOB or distress noted  Assessment and Plan: 1. Anxiety  2. Chronic bilateral thoracic back pain - traMADol (ULTRAM) 50 MG tablet; Take 2 tablets (100 mg total) by mouth every 8 (eight) hours as needed (pain).  Dispense: 180 tablet; Refill: 3  3. Chronic constipation  4. Other chronic pain  5. COPD exacerbation ([place])  6. Current moderate episode of major depressive disorder, unspecified whether recurrent ([place])  7. Gastroesophageal reflux disease without esophagitis  8. Insomnia, unspecified type   9. Hypothyroidism, unspecified type  10. Essential hypertension  11. Hyperlipidemia, unspecified hyperlipidemia type  12. Pain medication agreement signed - traMADol (ULTRAM) 50 MG tablet; Take 2 tablets (100 mg  total) by mouth every 8 (eight) hours as needed (pain).  Dispense: 180 tablet; Refill: 3  13. Uncomplicated opioid dependence (HCC) - traMADol (ULTRAM) 50 MG tablet; Take 2 tablets (100 mg total) by mouth every 8 (eight) hours as needed (pain).  Dispense: 180 tablet; Refill: 3  14. Chronic pain syndrome - traMADol (ULTRAM) 50 MG tablet; Take 2 tablets (100 mg total) by mouth every 8 (eight) hours as needed (pain).  Dispense: 180 tablet; Refill: 3  Continue medications Discussed stopping Hydormet Syrup Pt reviewed in [place] controlled- No red flags    I discussed the assessment and treatment plan with the patient. The patient was provided an opportunity to ask questions and all were answered. The patient agreed with the plan and demonstrated an understanding of the instructions.   The patient was advised to call back or seek an in-person evaluation if the symptoms worsen or if the condition fails to improve as anticipated.  The above assessment and management plan was discussed with the patient. The patient verbalized understanding of and has agreed to the management plan. Patient is aware to call the clinic if symptoms persist or worsen. Patient is aware when to return to the clinic for a follow-up visit. Patient educated on when it is appropriate to go to the emergency department.   Time call ended:  12:04 pm  I provided 20 minutes of non-face-to-face time during this encounter.    Evelina Dun, FNP

## 2018-12-20 ENCOUNTER — Other Ambulatory Visit: Payer: Self-pay

## 2018-12-20 ENCOUNTER — Encounter: Payer: Self-pay | Admitting: Family

## 2018-12-20 ENCOUNTER — Ambulatory Visit (INDEPENDENT_AMBULATORY_CARE_PROVIDER_SITE_OTHER): Payer: Medicare Other | Admitting: Family

## 2018-12-20 DIAGNOSIS — J441 Chronic obstructive pulmonary disease with (acute) exacerbation: Secondary | ICD-10-CM | POA: Diagnosis not present

## 2018-12-20 MED ORDER — DOXYCYCLINE HYCLATE 100 MG PO TABS: 100.0000 mg | ORAL_TABLET | Freq: Two times a day (BID) | ORAL | 0 refills | Status: DC

## 2018-12-20 MED ORDER — HYDROCODONE-HOMATROPINE 5-1.5 MG/5ML PO SYRP: 5.0000 mL | ORAL_SOLUTION | Freq: Three times a day (TID) | ORAL | 0 refills | Status: DC | PRN

## 2018-12-20 MED ORDER — PREDNISONE 10 MG (21) PO TBPK: ORAL_TABLET | ORAL | 0 refills | Status: DC

## 2018-12-20 NOTE — Progress Notes (Signed)
Virtual Visit via telephone Note  I connected with Stacy Dalton on 12/20/18 at 9:25 AM by telephone and verified that I am speaking with the correct person using two identifiers. Stacy Dalton is currently located at home  and no one is currently with her during visit. The provider, Evelina Dun, FNP is located in their office at time of visit.  I discussed the limitations, risks, security and privacy concerns of performing an evaluation and management service by telephone and the availability of in person appointments. I also discussed with the patient that there may be a patient responsible charge related to this service. The patient expressed understanding and agreed to proceed.   History and Present Illness:  PT calls the office today with recurrent cough. She states she mowed her yard 5-7 days ago and does not know if she flared something up. She was hospitalized with multilobar pneumonia on 09/19/18.  Cough This is a recurrent problem. The current episode started in the past 7 days. The problem has been waxing and waning. The problem occurs every few minutes. The cough is productive of sputum. Associated symptoms include a fever, headaches, myalgias, postnasal drip, a sore throat, shortness of breath and wheezing. Pertinent negatives include no chills, ear congestion or ear pain. She has tried rest for the symptoms. The treatment provided mild relief. Her past medical history is significant for COPD and environmental allergies.      Review of Systems  Constitutional: Positive for fever. Negative for chills.  HENT: Positive for postnasal drip and sore throat. Negative for ear pain.   Respiratory: Positive for cough, shortness of breath and wheezing.   Musculoskeletal: Positive for myalgias.  Neurological: Positive for headaches.  Endo/Heme/Allergies: Positive for environmental allergies.  All other systems reviewed and are negative.    Observations/Objective: No SOB or  distress noted, intermittent dry cough  Assessment and Plan: 1. COPD exacerbation ([place]) -Will treat with doxycyline today since having fevers and hx of pneumonia  Long discussion with patient that if fevers, SOB, cough worsen to go to ED over the weekedn Rest Force fluids Mucinex as needed RTO in 1 week to recheck, but she will go to ED if symptoms worsen  - doxycycline (VIBRA-TABS) 100 MG tablet; Take 1 tablet (100 mg total) by mouth 2 (two) times daily.  Dispense: 20 tablet; Refill: 0 - predniSONE (STERAPRED UNI-PAK 21 TAB) 10 MG (21) TBPK tablet; Use as directed  Dispense: 21 tablet; Refill: 0 - HYDROcodone-homatropine (HYCODAN) 5-1.5 MG/5ML syrup; Take 5 mLs by mouth every 8 (eight) hours as needed for cough.  Dispense: 120 mL; Refill: 0      I discussed the assessment and treatment plan with the patient. The patient was provided an opportunity to ask questions and all were answered. The patient agreed with the plan and demonstrated an understanding of the instructions.   The patient was advised to call back or seek an in-person evaluation if the symptoms worsen or if the condition fails to improve as anticipated.  The above assessment and management plan was discussed with the patient. The patient verbalized understanding of and has agreed to the management plan. Patient is aware to call the clinic if symptoms persist or worsen. Patient is aware when to return to the clinic for a follow-up visit. Patient educated on when it is appropriate to go to the emergency department.   Time call ended:  9:40 AM  I provided 15 minutes of non-face-to-face time during this encounter.  Evelina Dun, FNP

## 2019-01-23 ENCOUNTER — Other Ambulatory Visit: Payer: Self-pay | Admitting: Family

## 2019-01-23 DIAGNOSIS — J441 Chronic obstructive pulmonary disease with (acute) exacerbation: Secondary | ICD-10-CM

## 2019-01-23 MED ORDER — HYDROCODONE-HOMATROPINE 5-1.5 MG/5ML PO SYRP: 5.0000 mL | ORAL_SOLUTION | Freq: Three times a day (TID) | ORAL | 0 refills | Status: DC | PRN

## 2019-01-23 NOTE — Telephone Encounter (Signed)
Mail order sent electronic request Pt wanted this to go to [facility] in [place]

## 2019-01-23 NOTE — Addendum Note (Signed)
Addended by: Antonietta Barcelona D on: 01/23/2019 03:44 PM   Modules accepted: Orders

## 2019-02-19 ENCOUNTER — Other Ambulatory Visit: Payer: Self-pay | Admitting: Family

## 2019-02-19 DIAGNOSIS — I10 Essential (primary) hypertension: Secondary | ICD-10-CM

## 2019-02-20 ENCOUNTER — Other Ambulatory Visit: Payer: Self-pay | Admitting: Family

## 2019-02-20 DIAGNOSIS — A084 Viral intestinal infection, unspecified: Secondary | ICD-10-CM

## 2019-02-20 DIAGNOSIS — J441 Chronic obstructive pulmonary disease with (acute) exacerbation: Secondary | ICD-10-CM

## 2019-02-20 DIAGNOSIS — I10 Essential (primary) hypertension: Secondary | ICD-10-CM

## 2019-02-20 NOTE — Telephone Encounter (Signed)
What is the name of the medication? Amlodipine 10-40 mg, Ondansetron 4 mg, Hydrocodone-homatrophine 5-1.5 mg/73ml  Have you contacted your pharmacy to request a refill? NO  Which pharmacy would you like this sent to? [facility] in [place] on [address]  Patient notified that their request is being sent to the clinical staff for review and that they should receive a call once it is complete. If they do not receive a call within 24 hours they can check with their pharmacy or our office.

## 2019-02-21 ENCOUNTER — Other Ambulatory Visit: Payer: Self-pay | Admitting: Family

## 2019-02-21 DIAGNOSIS — A084 Viral intestinal infection, unspecified: Secondary | ICD-10-CM

## 2019-02-21 MED ORDER — AMLODIPINE-OLMESARTAN 10-40 MG PO TABS: 1.0000 | ORAL_TABLET | Freq: Every day | ORAL | 3 refills | Status: DC

## 2019-02-21 NOTE — Telephone Encounter (Signed)
Last seen 6/19, last filled 5/1 with 3 refill

## 2019-02-21 NOTE — Telephone Encounter (Signed)
What is the name of the medication? ondansetron (ZOFRAN) 4 MG tablet HYDROcodone-homatropine (HYCODAN) 5-1.5   ,   Have you contacted your pharmacy to request a refill?yes  Which pharmacy would you like this sent to? [facility] in [place]   Patient notified that their request is being sent to the clinical staff for review and that they should receive a call once it is complete. If they do not receive a call within 24 hours they can check with their pharmacy or our office.

## 2019-02-25 MED ORDER — ONDANSETRON HCL 4 MG PO TABS: 4.0000 mg | ORAL_TABLET | Freq: Three times a day (TID) | ORAL | 3 refills | Status: DC | PRN

## 2019-02-25 MED ORDER — HYDROCODONE-HOMATROPINE 5-1.5 MG/5ML PO SYRP: 5.0000 mL | ORAL_SOLUTION | Freq: Three times a day (TID) | ORAL | 0 refills | Status: DC | PRN

## 2019-02-25 NOTE — Telephone Encounter (Signed)
Prescription sent to pharmacy.

## 2019-03-14 ENCOUNTER — Other Ambulatory Visit: Payer: Self-pay | Admitting: Family

## 2019-03-14 DIAGNOSIS — J441 Chronic obstructive pulmonary disease with (acute) exacerbation: Secondary | ICD-10-CM

## 2019-03-24 ENCOUNTER — Ambulatory Visit (INDEPENDENT_AMBULATORY_CARE_PROVIDER_SITE_OTHER): Payer: Medicare Other | Admitting: Family

## 2019-03-24 ENCOUNTER — Encounter: Payer: Self-pay | Admitting: Family

## 2019-03-24 DIAGNOSIS — J441 Chronic obstructive pulmonary disease with (acute) exacerbation: Secondary | ICD-10-CM | POA: Diagnosis not present

## 2019-03-24 DIAGNOSIS — H65192 Other acute nonsuppurative otitis media, left ear: Secondary | ICD-10-CM | POA: Diagnosis not present

## 2019-03-24 MED ORDER — HYDROCODONE-HOMATROPINE 5-1.5 MG/5ML PO SYRP: 5.0000 mL | ORAL_SOLUTION | Freq: Three times a day (TID) | ORAL | 0 refills | Status: DC | PRN

## 2019-03-24 MED ORDER — AMOXICILLIN-POT CLAVULANATE 875-125 MG PO TABS: 1.0000 | ORAL_TABLET | Freq: Two times a day (BID) | ORAL | 0 refills | Status: DC

## 2019-03-24 NOTE — Progress Notes (Signed)
Virtual Visit via telephone Note Due to COVID-19 pandemic this visit was conducted virtually. This visit type was conducted due to national recommendations for restrictions regarding the COVID-19 Pandemic (e.g. social distancing, sheltering in place) in an effort to limit this patient's exposure and mitigate transmission in our community. All issues noted in this document were discussed and addressed.  A physical exam was not performed with this format.  I connected with Stacy Dalton on 03/24/19 at 4:12 pm by telephone and verified that I am speaking with the correct person using two identifiers. Stacy Dalton is currently located at home and no one is currently with her during visit. The provider, Evelina Dun, FNP is located in their office at time of visit.  I discussed the limitations, risks, security and privacy concerns of performing an evaluation and management service by telephone and the availability of in person appointments. I also discussed with the patient that there may be a patient responsible charge related to this service. The patient expressed understanding and agreed to proceed.   History and Present Illness:  Otalgia  There is pain in the left ear. This is a new problem. The current episode started 1 to 4 weeks ago. The problem occurs every few minutes. The problem has been waxing and waning. Maximum temperature: 99. The pain is at a severity of 5/10. The pain is moderate. Associated symptoms include coughing. Pertinent negatives include no diarrhea, ear discharge, headaches, hearing loss, neck pain, rhinorrhea, sore throat or vomiting. She has tried ear drops and NSAIDs for the symptoms. The treatment provided mild relief.      Review of Systems  HENT: Positive for ear pain. Negative for ear discharge, hearing loss, rhinorrhea and sore throat.   Respiratory: Positive for cough.   Gastrointestinal: Negative for diarrhea and vomiting.  Musculoskeletal: Negative  for neck pain.  Neurological: Negative for headaches.  All other systems reviewed and are negative.    Observations/Objective: No SOB or distress noted, intermittent dry nonproductive cough noted   Assessment and Plan: 1. Other acute nonsuppurative otitis media of left ear, recurrence not specified - Take meds as prescribed - Use a cool mist humidifier  -Use saline nose sprays frequently -Force fluids -For any cough or congestion  Use plain Mucinex- regular strength or max strength is fine -For fever or aces or pains- take tylenol or ibuprofen. -Throat lozenges if help -Call office if symptoms worsen or do not improve  - amoxicillin-clavulanate (AUGMENTIN) 875-125 MG tablet; Take 1 tablet by mouth 2 (two) times daily for 10 days.  Dispense: 20 tablet; Refill: 0  2. COPD exacerbation (HCC) - HYDROcodone-homatropine (HYCODAN) 5-1.5 MG/5ML syrup; Take 5 mLs by mouth every 8 (eight) hours as needed for cough.  Dispense: 473 mL; Refill: 0   Follow Up Instructions Keep follow up    I discussed the assessment and treatment plan with the patient. The patient was provided an opportunity to ask questions and all were answered. The patient agreed with the plan and demonstrated an understanding of the instructions.   The patient was advised to call back or seek an in-person evaluation if the symptoms worsen or if the condition fails to improve as anticipated.  The above assessment and management plan was discussed with the patient. The patient verbalized understanding of and has agreed to the management plan. Patient is aware to call the clinic if symptoms persist or worsen. Patient is aware when to return to the clinic for a follow-up visit. Patient  educated on when it is appropriate to go to the emergency department.   Time call ended:  4:23 pm  I provided 10 minutes of non-face-to-face time during this encounter.    Evelina Dun, FNP

## 2019-03-28 ENCOUNTER — Other Ambulatory Visit: Payer: Self-pay | Admitting: Family

## 2019-03-28 NOTE — Telephone Encounter (Signed)
This medication has refilled on the visit.

## 2019-03-28 NOTE — Telephone Encounter (Signed)
What is the name of the medication? temazepam (RESTORIL) 30 MG capsule    Have you contacted your pharmacy to request a refill? YES  Which pharmacy would you like this sent to? [facility] in [place] , pt is completely out and with out this medication she gets no sleep    Patient notified that their request is being sent to the clinical staff for review and that they should receive a call once it is complete. If they do not receive a call within 24 hours they can check with their pharmacy or our office.

## 2019-03-31 ENCOUNTER — Other Ambulatory Visit: Payer: Self-pay | Admitting: *Deleted

## 2019-03-31 ENCOUNTER — Other Ambulatory Visit: Payer: Self-pay

## 2019-03-31 DIAGNOSIS — G4700 Insomnia, unspecified: Secondary | ICD-10-CM

## 2019-03-31 MED ORDER — TEMAZEPAM 30 MG PO CAPS: 30.0000 mg | ORAL_CAPSULE | Freq: Every evening | ORAL | 0 refills | Status: DC | PRN

## 2019-03-31 MED ORDER — TEMAZEPAM 30 MG PO CAPS: 30.0000 mg | ORAL_CAPSULE | Freq: Every evening | ORAL | 1 refills | Status: DC | PRN

## 2019-03-31 NOTE — Telephone Encounter (Signed)
Requesting refill of restoril to be sent to [facility]- please advise

## 2019-03-31 NOTE — Telephone Encounter (Signed)
Patient states that she has been out of restoril for a few days and has not gotten any sleep.  Patient would like to know if she can have enough to get her to her appt on 10/1

## 2019-03-31 NOTE — Telephone Encounter (Signed)
Needs chronic follow up

## 2019-04-02 ENCOUNTER — Other Ambulatory Visit: Payer: Self-pay

## 2019-04-03 ENCOUNTER — Ambulatory Visit (INDEPENDENT_AMBULATORY_CARE_PROVIDER_SITE_OTHER): Payer: Medicare Other | Admitting: Family

## 2019-04-03 ENCOUNTER — Encounter: Payer: Self-pay | Admitting: Family

## 2019-04-03 VITALS — BP 139/74 | HR 96 | Temp 98.2°F | Ht 61.0 in | Wt 108.0 lb

## 2019-04-03 DIAGNOSIS — I10 Essential (primary) hypertension: Secondary | ICD-10-CM | POA: Diagnosis not present

## 2019-04-03 DIAGNOSIS — J441 Chronic obstructive pulmonary disease with (acute) exacerbation: Secondary | ICD-10-CM

## 2019-04-03 DIAGNOSIS — E039 Hypothyroidism, unspecified: Secondary | ICD-10-CM

## 2019-04-03 DIAGNOSIS — E785 Hyperlipidemia, unspecified: Secondary | ICD-10-CM

## 2019-04-03 DIAGNOSIS — K5909 Other constipation: Secondary | ICD-10-CM

## 2019-04-03 DIAGNOSIS — G894 Chronic pain syndrome: Secondary | ICD-10-CM

## 2019-04-03 DIAGNOSIS — F321 Major depressive disorder, single episode, moderate: Secondary | ICD-10-CM

## 2019-04-03 DIAGNOSIS — M546 Pain in thoracic spine: Secondary | ICD-10-CM

## 2019-04-03 DIAGNOSIS — Z0289 Encounter for other administrative examinations: Secondary | ICD-10-CM

## 2019-04-03 DIAGNOSIS — F419 Anxiety disorder, unspecified: Secondary | ICD-10-CM

## 2019-04-03 DIAGNOSIS — G8929 Other chronic pain: Secondary | ICD-10-CM

## 2019-04-03 DIAGNOSIS — F1120 Opioid dependence, uncomplicated: Secondary | ICD-10-CM

## 2019-04-03 DIAGNOSIS — E559 Vitamin D deficiency, unspecified: Secondary | ICD-10-CM

## 2019-04-03 DIAGNOSIS — G4700 Insomnia, unspecified: Secondary | ICD-10-CM

## 2019-04-03 DIAGNOSIS — K219 Gastro-esophageal reflux disease without esophagitis: Secondary | ICD-10-CM

## 2019-04-03 MED ORDER — TRAMADOL HCL 50 MG PO TABS: 100.0000 mg | ORAL_TABLET | Freq: Three times a day (TID) | ORAL | 3 refills | Status: DC | PRN

## 2019-04-03 MED ORDER — TEMAZEPAM 30 MG PO CAPS: 30.0000 mg | ORAL_CAPSULE | Freq: Every evening | ORAL | 1 refills | Status: DC | PRN

## 2019-04-03 NOTE — Progress Notes (Signed)
Subjective:    Patient ID: Stacy Dalton, female    DOB: 1950-08-02, 68 y.o.   MRN: [id number]  Chief Complaint  Patient presents with  . Medical Management of Chronic Issues   Pt calls the office today for chronic follow up. PT has pulmonologistsfor COPD and chronic cough. Hypertension This is a chronic problem. The current episode started more than 1 year ago. The problem has been resolved since onset. The problem is controlled. Associated symptoms include anxiety and malaise/fatigue. Pertinent negatives include no peripheral edema or shortness of breath. Risk factors for coronary artery disease include dyslipidemia and sedentary lifestyle. The current treatment provides moderate improvement. There is no history of CAD/MI or heart failure. Identifiable causes of hypertension include a thyroid problem.  Gastroesophageal Reflux She complains of coughing and a hoarse voice. She reports no belching or no heartburn. This is a chronic problem. The current episode started more than 1 year ago. The problem occurs occasionally. The problem has been waxing and waning. Associated symptoms include fatigue. She has tried an antacid for the symptoms. The treatment provided moderate relief.  Constipation This is a chronic problem. The current episode started more than 1 year ago. The problem has been waxing and waning since onset. Her stool frequency is 1 time per day. Associated symptoms include back pain. Pertinent negatives include no diarrhea. She has tried diet changes (held cough syrup) for the symptoms. The treatment provided moderate relief.  Thyroid Problem Presents for follow-up visit. Symptoms include anxiety, constipation, fatigue and hoarse voice. Patient reports no diaphoresis or diarrhea. The symptoms have been stable. Her past medical history is significant for hyperlipidemia. There is no history of heart failure.  Insomnia Primary symptoms: difficulty falling asleep, frequent awakening,  malaise/fatigue.  PMH includes: depression.  Hyperlipidemia This is a chronic problem. The current episode started more than 1 year ago. The problem is uncontrolled. Pertinent negatives include no shortness of breath. Current antihyperlipidemic treatment includes diet change. The current treatment provides mild improvement of lipids. Risk factors for coronary artery disease include dyslipidemia, hypertension, a sedentary lifestyle and post-menopausal.  Depression        This is a chronic problem.  The current episode started more than 1 year ago.   The onset quality is gradual.   The problem occurs intermittently.  The problem has been waxing and waning since onset.  Associated symptoms include decreased concentration, fatigue, insomnia, irritable, restlessness, decreased interest and sad.  Associated symptoms include no helplessness and no hopelessness.  Past treatments include SNRIs - Serotonin and norepinephrine reuptake inhibitors.  Compliance with treatment is good.  Past medical history includes thyroid problem and anxiety.   Back Pain This is a chronic problem. The current episode started more than 1 year ago. The problem occurs intermittently. The problem has been waxing and waning since onset. The pain is present in the lumbar spine. The quality of the pain is described as aching. The pain is moderate. Pertinent negatives include no bladder incontinence or bowel incontinence. She has tried bed rest and analgesics for the symptoms. The treatment provided mild relief.  Anxiety Presents for follow-up visit. Symptoms include decreased concentration, excessive worry, insomnia, irritability, nervous/anxious behavior and restlessness. Patient reports no shortness of breath. Symptoms occur occasionally. The severity of symptoms is moderate.        Review of Systems  Constitutional: Positive for fatigue, irritability and malaise/fatigue. Negative for diaphoresis.  HENT: Positive for hoarse voice.    Respiratory: Positive for cough. Negative  for shortness of breath.   Gastrointestinal: Positive for constipation. Negative for bowel incontinence, diarrhea and heartburn.  Genitourinary: Negative for bladder incontinence.  Musculoskeletal: Positive for back pain.  Psychiatric/Behavioral: Positive for decreased concentration and depression. The patient is nervous/anxious and has insomnia.   All other systems reviewed and are negative.      Objective:   Physical Exam Vitals signs reviewed.  Constitutional:      General: She is irritable. She is not in acute distress.    Appearance: She is well-developed.  HENT:     Head: Normocephalic and atraumatic.     Right Ear: Tympanic membrane normal.     Left Ear: Tympanic membrane normal.  Eyes:     Pupils: Pupils are equal, round, and reactive to light.  Neck:     Musculoskeletal: Normal range of motion and neck supple.     Thyroid: No thyromegaly.  Cardiovascular:     Rate and Rhythm: Normal rate and regular rhythm.     Heart sounds: Normal heart sounds. No murmur.  Pulmonary:     Effort: Pulmonary effort is normal. No respiratory distress.     Breath sounds: Normal breath sounds. No wheezing.  Abdominal:     General: Bowel sounds are normal. There is no distension.     Palpations: Abdomen is soft.     Tenderness: There is no abdominal tenderness.  Musculoskeletal: Normal range of motion.        General: No tenderness.  Skin:    General: Skin is warm and dry.  Neurological:     Mental Status: She is alert and oriented to person, place, and time.     Cranial Nerves: No cranial nerve deficit.     Deep Tendon Reflexes: Reflexes are normal and symmetric.  Psychiatric:        Behavior: Behavior normal.        Thought Content: Thought content normal.        Judgment: Judgment normal.        BP 139/74   Pulse 96   Temp 98.2 F (36.8 C) (Temporal)   Ht _0  (1.549 m)   Wt 108 lb (49 kg)   SpO2 100%   BMI 20.41 kg/m    Assessment & Plan:  Stacy Dalton comes in today with chief complaint of Medical Management of Chronic Issues   Diagnosis and orders addressed:  1. Essential hypertension - CMP14+EGFR - CBC with Differential/Platelet  2. COPD exacerbation ([place]) - CMP14+EGFR - CBC with Differential/Platelet  3. Gastroesophageal reflux disease without esophagitis - CMP14+EGFR - CBC with Differential/Platelet  4. Chronic constipation - CMP14+EGFR - CBC with Differential/Platelet  5. Hypothyroidism, unspecified type - CMP14+EGFR - CBC with Differential/Platelet - TSH  6. Vitamin D deficiency disease - CMP14+EGFR - CBC with Differential/Platelet - VITAMIN D 25 Hydroxy (Vit-D Deficiency, Fractures)  7. Pain medication agreement signed - CMP14+EGFR - CBC with Differential/Platelet - ToxASSURE Select 13 (MW), Urine - traMADol (ULTRAM) 50 MG tablet; Take 2 tablets (100 mg total) by mouth every 8 (eight) hours as needed (pain).  Dispense: 180 tablet; Refill: 3  8. Uncomplicated opioid dependence ([place]) - CMP14+EGFR - CBC with Differential/Platelet - ToxASSURE Select 13 (MW), Urine - traMADol (ULTRAM) 50 MG tablet; Take 2 tablets (100 mg total) by mouth every 8 (eight) hours as needed (pain).  Dispense: 180 tablet; Refill: 3  9. Insomnia, unspecified type - CMP14+EGFR - CBC with Differential/Platelet - temazepam (RESTORIL) 30 MG capsule; Take 1 capsule (30 mg total)  by mouth at bedtime as needed for sleep.  Dispense: 90 capsule; Refill: 1  10. Hyperlipidemia, unspecified hyperlipidemia type - CMP14+EGFR - CBC with Differential/Platelet - Lipid panel  11. Current moderate episode of major depressive disorder, unspecified whether recurrent (HCC) - CMP14+EGFR - CBC with Differential/Platelet  12. Anxiety - CMP14+EGFR - CBC with Differential/Platelet  13. Chronic bilateral thoracic back pain - CMP14+EGFR - CBC with Differential/Platelet - traMADol (ULTRAM) 50 MG tablet; Take 2  tablets (100 mg total) by mouth every 8 (eight) hours as needed (pain).  Dispense: 180 tablet; Refill: 3  14. Chronic pain syndrome - traMADol (ULTRAM) 50 MG tablet; Take 2 tablets (100 mg total) by mouth every 8 (eight) hours as needed (pain).  Dispense: 180 tablet; Refill: 3   Labs pending Health Maintenance reviewed Diet and exercise encouraged  Follow up plan: 3 months    Evelina Dun, FNP

## 2019-04-03 NOTE — Patient Instructions (Signed)

## 2019-04-04 ENCOUNTER — Other Ambulatory Visit: Payer: Self-pay | Admitting: Family

## 2019-04-04 DIAGNOSIS — E559 Vitamin D deficiency, unspecified: Secondary | ICD-10-CM

## 2019-04-04 LAB — CBC WITH DIFFERENTIAL/PLATELET
Basophils Absolute: 0.1 10*3/uL (ref 0.0–0.2)
Basos: 1 %
EOS (ABSOLUTE): 0.3 10*3/uL (ref 0.0–0.4)
Eos: 4 %
Hematocrit: 43.8 % (ref 34.0–46.6)
Hemoglobin: 13.6 g/dL (ref 11.1–15.9)
Immature Grans (Abs): 0 10*3/uL (ref 0.0–0.1)
Immature Granulocytes: 1 %
Lymphocytes Absolute: 1.7 10*3/uL (ref 0.7–3.1)
Lymphs: 19 %
MCH: 25.6 pg — ABNORMAL LOW (ref 26.6–33.0)
MCHC: 31.1 g/dL — ABNORMAL LOW (ref 31.5–35.7)
MCV: 83 fL (ref 79–97)
Monocytes Absolute: 0.9 10*3/uL (ref 0.1–0.9)
Monocytes: 10 %
Neutrophils Absolute: 5.8 10*3/uL (ref 1.4–7.0)
Neutrophils: 65 %
Platelets: 483 10*3/uL — ABNORMAL HIGH (ref 150–450)
RBC: 5.31 x10E6/uL — ABNORMAL HIGH (ref 3.77–5.28)
RDW: 15.5 % — ABNORMAL HIGH (ref 11.7–15.4)
WBC: 8.7 10*3/uL (ref 3.4–10.8)

## 2019-04-04 LAB — TSH: TSH: 2.25 u[IU]/mL (ref 0.450–4.500)

## 2019-04-04 LAB — CMP14+EGFR
ALT: 10 IU/L (ref 0–32)
AST: 17 IU/L (ref 0–40)
Albumin/Globulin Ratio: 2 (ref 1.2–2.2)
Albumin: 5 g/dL — ABNORMAL HIGH (ref 3.8–4.8)
Alkaline Phosphatase: 111 IU/L (ref 39–117)
BUN/Creatinine Ratio: 16 (ref 12–28)
BUN: 16 mg/dL (ref 8–27)
Bilirubin Total: <0.2 mg/dL (ref 0.0–1.2)
CO2: 24 mmol/L (ref 20–29)
Calcium: 10.7 mg/dL — ABNORMAL HIGH (ref 8.7–10.3)
Chloride: 98 mmol/L (ref 96–106)
Creatinine, Ser: 1 mg/dL (ref 0.57–1.00)
GFR calc Af Amer: 66 mL/min/{1.73_m2} (ref >59)
GFR calc non Af Amer: 57 mL/min/{1.73_m2} — ABNORMAL LOW (ref >59)
Globulin, Total: 2.5 g/dL (ref 1.5–4.5)
Glucose: 89 mg/dL (ref 65–99)
Potassium: 4.4 mmol/L (ref 3.5–5.2)
Sodium: 140 mmol/L (ref 134–144)
Total Protein: 7.5 g/dL (ref 6.0–8.5)

## 2019-04-04 LAB — LIPID PANEL
Chol/HDL Ratio: 4.6 ratio — ABNORMAL HIGH (ref 0.0–4.4)
Cholesterol, Total: 289 mg/dL — ABNORMAL HIGH (ref 100–199)
HDL: 63 mg/dL (ref >39)
LDL Chol Calc (NIH): 187 mg/dL — ABNORMAL HIGH (ref 0–99)
Triglycerides: 208 mg/dL — ABNORMAL HIGH (ref 0–149)
VLDL Cholesterol Cal: 39 mg/dL (ref 5–40)

## 2019-04-04 LAB — VITAMIN D 25 HYDROXY (VIT D DEFICIENCY, FRACTURES): Vit D, 25-Hydroxy: 28.7 ng/mL — ABNORMAL LOW (ref 30.0–100.0)

## 2019-04-04 MED ORDER — ATORVASTATIN CALCIUM 20 MG PO TABS: 20.0000 mg | ORAL_TABLET | Freq: Every day | ORAL | 11 refills | Status: DC

## 2019-04-04 MED ORDER — VITAMIN D (ERGOCALCIFEROL) 1.25 MG (50000 UNIT) PO CAPS: ORAL_CAPSULE | ORAL | 3 refills | Status: DC

## 2019-04-07 LAB — TOXASSURE SELECT 13 (MW), URINE

## 2019-04-08 ENCOUNTER — Ambulatory Visit: Payer: Medicare Other | Admitting: Family

## 2019-05-15 ENCOUNTER — Telehealth: Payer: Self-pay | Admitting: Family

## 2019-05-15 NOTE — Chronic Care Management (AMB) (Signed)
°  Chronic Care Management   Outreach Note  05/15/2019 Name: Stacy Dalton MRN: [id number] DOB: 1950/12/09  Referred by: [name], FNP Reason for referral : Chronic Care Management (Initial CCM outreach was unsuccessful. )   An unsuccessful telephone outreach was attempted today. The patient was referred to the case management team by for assistance with care management and care coordination.   Follow Up Plan: A HIPPA compliant phone message was left for the patient providing contact information and requesting a return call.  The care management team will reach out to the patient again over the next 7 days.  If patient returns call to provider office, please advise to call [facility] [place], [place] [facility]  [place], [place] [address] Direct Dial: [email or web address]  Website: [email or web address]

## 2019-05-16 ENCOUNTER — Other Ambulatory Visit: Payer: Self-pay | Admitting: Family

## 2019-05-16 NOTE — Chronic Care Management (AMB) (Signed)
Chronic Care Management   Note  05/16/2019 Name: Stacy Dalton MRN: [id number] DOB: [birth year]  Stacy Dalton is a 68 y.o. year old female who is a primary care patient of [name], FNP. I reached out to [name] by phone today in response to a referral sent by [name]'s health plan.     [name] was given information about Chronic Care Management services today including:  1. CCM service includes personalized support from designated clinical staff supervised by her physician, including individualized plan of care and coordination with other care providers 2. 24/7 contact phone numbers for assistance for urgent and routine care needs. 3. Service will only be billed when office clinical staff spend 20 minutes or more in a month to coordinate care. 4. Only one practitioner may furnish and bill the service in a calendar month. 5. The patient may stop CCM services at any time (effective at the end of the month) by phone call to the office staff. 6. The patient will be responsible for cost sharing (co-pay) of up to 20% of the service fee (after annual deductible is met).  Patient agreed to services and verbal consent obtained.   Follow up plan: Telephone appointment with CCM team member scheduled for: 05/28/2019  [place], [place], [place] [address] Direct Dial: [email or web address]  Website: [email or web address]

## 2019-05-23 ENCOUNTER — Other Ambulatory Visit: Payer: Self-pay

## 2019-05-23 ENCOUNTER — Other Ambulatory Visit: Payer: Medicare Other

## 2019-05-23 DIAGNOSIS — E559 Vitamin D deficiency, unspecified: Secondary | ICD-10-CM | POA: Diagnosis not present

## 2019-05-24 LAB — PTH, INTACT AND CALCIUM
Calcium: 10 mg/dL (ref 8.7–10.3)
PTH: 28 pg/mL (ref 15–65)

## 2019-05-26 ENCOUNTER — Telehealth: Payer: Self-pay | Admitting: Family

## 2019-05-26 NOTE — Telephone Encounter (Signed)
Patient aware and verbalized understanding. °

## 2019-05-27 ENCOUNTER — Other Ambulatory Visit: Payer: Self-pay

## 2019-05-27 ENCOUNTER — Ambulatory Visit (INDEPENDENT_AMBULATORY_CARE_PROVIDER_SITE_OTHER): Payer: Medicare Other

## 2019-05-27 DIAGNOSIS — Z23 Encounter for immunization: Secondary | ICD-10-CM

## 2019-05-28 ENCOUNTER — Telehealth: Payer: Medicare Other

## 2019-05-28 ENCOUNTER — Ambulatory Visit (INDEPENDENT_AMBULATORY_CARE_PROVIDER_SITE_OTHER): Payer: Medicare Other | Admitting: Licensed Clinical Social Worker

## 2019-05-28 DIAGNOSIS — G4700 Insomnia, unspecified: Secondary | ICD-10-CM

## 2019-05-28 DIAGNOSIS — F419 Anxiety disorder, unspecified: Secondary | ICD-10-CM

## 2019-05-28 DIAGNOSIS — F321 Major depressive disorder, single episode, moderate: Secondary | ICD-10-CM

## 2019-05-28 DIAGNOSIS — J441 Chronic obstructive pulmonary disease with (acute) exacerbation: Secondary | ICD-10-CM | POA: Diagnosis not present

## 2019-05-28 DIAGNOSIS — I10 Essential (primary) hypertension: Secondary | ICD-10-CM | POA: Diagnosis not present

## 2019-05-28 DIAGNOSIS — E039 Hypothyroidism, unspecified: Secondary | ICD-10-CM

## 2019-05-28 NOTE — Chronic Care Management (AMB) (Addendum)
  Care Management Note   Stacy Dalton is a 68 y.o. year old female who is a primary care patient of Stacy Balloon, Stacy Dalton. The CM team was consulted for assistance with chronic disease management and care coordination.   I reached out to Stacy Dalton, son, by phone today.    Review of patient status, including review of consultants reports, relevant laboratory and other test results, and collaboration with appropriate care team members and the patient's provider was performed as part of comprehensive patient evaluation and provision of chronic care management services.   Social determinants of health: risk for stress; risk for depression    Office Visit from 04/03/2019 in [facility]  PHQ-9 Total Score  8     Medications    albuterol (VENTOLIN HFA) 108 (90 Base) MCG/ACT inhaler    amLODipine-olmesartan (AZOR) 10-40 MG tablet    Aspirin-Salicylamide-Caffeine (BC HEADACHE POWDER PO)    atorvastatin (LIPITOR) 20 MG tablet    DULoxetine (CYMBALTA) 60 MG capsule    Fluticasone Furoate-Vilanterol (BREO ELLIPTA IN)    levothyroxine (SYNTHROID, LEVOTHROID) 25 MCG tablet    ondansetron (ZOFRAN) 4 MG tablet    temazepam (RESTORIL) 30 MG capsule    traMADol (ULTRAM) 50 MG tablet    Vitamin D, Ergocalciferol, (DRISDOL) 1.25 MG (50000 UT) CAPS capsule    Goals Addressed    Goals    . Client would like to talk with someone about managing stress/anxiety issues she faces (pt-stated)     Current Barriers:  . Some Pain issues . Family Stress issues of concern  Clinical Social Work Clinical Goal(s):  . Client will talk with LCSW in next 30 days to discuss client's management of stress/anxiety issues faced by client  Interventions: . Provided patient Stacy Dalton, son with information about CCM program . Collaborated with RNCM regarding client nursing needs . Talked with Stacy Dalton about current client needs related to client's management .  of anxiety  and stress issues faced . Talked with Stacy Dalton about client's challenges with sleeping . Talked with Stacy Dalton about client's grief over past death of her spouse, Stacy Dalton.  Patient Self Care Activities:  . Attends all scheduled provider appointments . Performs ADL's independently . Performs IADL's independently  Plan: Client to attend scheduled client medical appointments Client to contact LCSW as needed to discuss anxiety or stress management for client LCSW to call client or Stacy Dalton in next 3 weeks to talk with client or Stacy Dalton about client management of anxiety or stress issues  Initial goal documentation       Follow Up Plan: LCSW to call client or Stacy Dalton in next 3 weeks to talk with client or Stacy Dalton about client management of  anxiety or stress issues faced  Stacy Dalton.Stacy Dalton MSW, LCSW Licensed Clinical Social Worker [facility] Care Management [phone]  I have reviewed and agree with the above AWV documentation.   Evelina Dun, Stacy Dalton

## 2019-05-28 NOTE — Patient Instructions (Signed)
Licensed Clinical Social Worker Visit Information  Goals we discussed today:  Goals Addressed            This Visit's Progress   . Client would like to talk with someone about managing stress/anxiety issues she faces (pt-stated)       Current Barriers:  . Some Pain issues . Family Stress issues of concern  Clinical Social Work Clinical Goal(s):  . Client will talk with LCSW in next 30 days to discuss client's management of stress/anxiety issues faced by client  Interventions: . Provided patient [name], son with information about CCM program . Collaborated with RNCM regarding client nursing needs . Talked with [name] about current client needs related to client's management .  of anxiety and stress issues faced . Talked with [name] about client's challenges with sleeping . Talked with [name] about client's grief over past death of her spouse, [name].  Patient Self Care Activities:  . Attends all scheduled provider appointments . Performs ADL's independently . Performs IADL's independently  Plan: Client to attend scheduled client medical appointments Client to contact LCSW as needed to discuss anxiety or stress management for client LCSW to call client or [name] in next 3 weeks to talk with client or [name] about client management of anxiety or stress issues  Initial goal documentation         Materials Provided: No  Follow Up Plan: LCSW to call client or [name] in next 3 weeks to talk with client or [name] about client management of anxiety or stress issues  The patient [name], son, verbalized understanding of instructions provided today and declined a print copy of patient instruction materials.   Norva Riffle.Selby Foisy MSW, LCSW Licensed Clinical Social Worker [facility] [phone]

## 2019-06-16 ENCOUNTER — Other Ambulatory Visit: Payer: Self-pay | Admitting: Family

## 2019-06-17 ENCOUNTER — Encounter: Payer: Self-pay | Admitting: Family

## 2019-06-17 ENCOUNTER — Ambulatory Visit (INDEPENDENT_AMBULATORY_CARE_PROVIDER_SITE_OTHER): Payer: Medicare Other | Admitting: Family

## 2019-06-17 ENCOUNTER — Telehealth: Payer: Self-pay | Admitting: Family

## 2019-06-17 DIAGNOSIS — G4700 Insomnia, unspecified: Secondary | ICD-10-CM | POA: Diagnosis not present

## 2019-06-17 DIAGNOSIS — R059 Cough, unspecified: Secondary | ICD-10-CM

## 2019-06-17 DIAGNOSIS — Z0289 Encounter for other administrative examinations: Secondary | ICD-10-CM | POA: Diagnosis not present

## 2019-06-17 DIAGNOSIS — R05 Cough: Secondary | ICD-10-CM | POA: Diagnosis not present

## 2019-06-17 MED ORDER — TEMAZEPAM 15 MG PO CAPS: 15.0000 mg | ORAL_CAPSULE | Freq: Every evening | ORAL | 2 refills | Status: DC | PRN
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
## 2019-06-18 MED ORDER — TEMAZEPAM 30 MG PO CAPS: 30.0000 mg | ORAL_CAPSULE | Freq: Every evening | ORAL | 2 refills | Status: DC | PRN

## 2019-06-18 NOTE — Telephone Encounter (Signed)
Restoril 30 mg [facility]

## 2019-06-18 NOTE — Telephone Encounter (Signed)
Patient aware and verbalized understanding. °

## 2019-06-20 ENCOUNTER — Ambulatory Visit (INDEPENDENT_AMBULATORY_CARE_PROVIDER_SITE_OTHER): Payer: Medicare Other | Admitting: Licensed Clinical Social Worker

## 2019-06-20 DIAGNOSIS — F321 Major depressive disorder, single episode, moderate: Secondary | ICD-10-CM

## 2019-06-20 DIAGNOSIS — F419 Anxiety disorder, unspecified: Secondary | ICD-10-CM

## 2019-06-20 DIAGNOSIS — J441 Chronic obstructive pulmonary disease with (acute) exacerbation: Secondary | ICD-10-CM

## 2019-06-20 DIAGNOSIS — E039 Hypothyroidism, unspecified: Secondary | ICD-10-CM

## 2019-06-20 DIAGNOSIS — G4700 Insomnia, unspecified: Secondary | ICD-10-CM

## 2019-06-20 DIAGNOSIS — K219 Gastro-esophageal reflux disease without esophagitis: Secondary | ICD-10-CM

## 2019-06-20 DIAGNOSIS — I10 Essential (primary) hypertension: Secondary | ICD-10-CM

## 2019-06-20 DIAGNOSIS — E785 Hyperlipidemia, unspecified: Secondary | ICD-10-CM | POA: Diagnosis not present

## 2019-06-20 NOTE — Patient Instructions (Addendum)
Licensed Clinical Social Worker Visit Information  Goals we discussed today:  Goals Addressed            This Visit's Progress   . Client would like to talk with someone about managing stress/anxiety issues she faces (pt-stated)       Current Barriers:  . Some Pain issues . Family Stress issues of concern in patient with Chronic Diagnoses of GERD, Anxiety, COPD, Hypthyroidism, HTN, HLD, Depression, Insomnia  Clinical Social Work Clinical Goal(s):  . Client will talk with LCSW in next 30 days to discuss client's management of stress/anxiety issues faced by client  Interventions: . Previously provided patient Stacy Dalton, son with information about CCM program  Previously talked with Stacy Dalton about current client needs related to client's management  of anxiety and stress issues faced  Talked with Stacy Dalton previously about client's challenges with sleeping  Talked with Stacy Dalton previously about client's grief over past death of her spouse, Stacy Dalton.  Patient Self Care Activities:  . Attends all scheduled provider appointments . Performs ADL's independently . Performs IADL's independently  Plan: Client to attend scheduled client medical appointments Client to contact LCSW as needed to discuss anxiety or stress management for client LCSW to call client or Stacy Dalton in next 4 weeks to talk with client or Stacy Dalton about client management of anxiety or stress issues  Initial goal documentation         Materials Provided: No  Follow Up Plan:  LCSW to call client or Stacy Dalton in next 4 weeks to talk with client or [name] about client management of anxiety or stress issues  The patient Stacy Dalton, son, verbalized understanding of instructions provided today and declined a print copy of patient instruction materials.   Norva Riffle.Dareon Nunziato MSW, LCSW Licensed Clinical Social Worker [facility] [phone]

## 2019-06-20 NOTE — Chronic Care Management (AMB) (Addendum)
  Care Management Note   Stacy Dalton is a 68 y.o. year old female who is a primary care patient of [name], FNP. The CM team was consulted for assistance with chronic disease management and care coordination.   I reached out to [place], son, by phone today.     Review of patient status, including review of consultants reports, relevant laboratory and other test results, and collaboration with appropriate care team members and the patient's provider was performed as part of comprehensive patient evaluation and provision of chronic care management services.   Social determinants of health: risk of tobacco use; risk of depression    Office Visit from 04/03/2019 in [facility]  PHQ-9 Total Score  8      Medications    albuterol (VENTOLIN HFA) 108 (90 Base) MCG/ACT inhaler amLODipine-olmesartan (AZOR) 10-40 MG tablet Aspirin-Salicylamide-Caffeine (BC HEADACHE POWDER PO) atorvastatin (LIPITOR) 20 MG tablet DULoxetine (CYMBALTA) 60 MG capsule Fluticasone Furoate-Vilanterol (BREO ELLIPTA IN) levothyroxine (SYNTHROID) 25 MCG tablet ondansetron (ZOFRAN) 4 MG tablet temazepam (RESTORIL) 30 MG capsule traMADol (ULTRAM) 50 MG tablet Vitamin D, Ergocalciferol, (DRISDOL) 1.25 MG (50000 UT) CAPS capsule  Goals Addressed             This Visit's Progress    Client would like to talk with someone about managing stress/anxiety issues she faces (pt-stated)       Current Barriers:  Some Pain issues Family Stress issues of concern in patient with Chronic Diagnoses of GERD, Anxiety, COPD, Hypthyroidism, HTN, HLD, Depression, Insomnia  Clinical Social Work Clinical Goal(s):  Client will talk with LCSW in next 30 days to discuss client's management of stress/anxiety issues faced by client  Interventions: Previously provided patient Stacy Dalton, son with information about CCM program Previously talked with Stacy Dalton about current client  needs related to client's management  of anxiety and stress issues faced Talked with Stacy Dalton previously about client's challenges with sleeping Talked with Stacy Dalton previously about client's grief over past death of her spouse, Stacy Dalton.  Patient Self Care Activities:  Attends all scheduled provider appointments Performs ADL's independently Performs IADL's independently  Plan: Client to attend scheduled client medical appointments Client to contact LCSW as needed to discuss anxiety or stress management for client LCSW to call client or Stacy Dalton in next 4 weeks to talk with client or Stacy Dalton about client management of anxiety or stress issues  Initial goal documentation        Follow Up Plan: LCSW to call client or Stacy Dalton in next 4 weeks to talk with client or Stacy Dalton about client management of anxiety or stress issues faced.  Norva Riffle.Merton Wadlow MSW, LCSW Licensed Clinical Social Worker [facility] [phone]  I have reviewed and agree with the above documentation.   Evelina Dun, FNP

## 2019-06-23 ENCOUNTER — Ambulatory Visit: Payer: Medicare Other | Attending: Internal Medicine

## 2019-06-23 DIAGNOSIS — Z20822 Contact with and (suspected) exposure to covid-19: Secondary | ICD-10-CM

## 2019-06-24 LAB — NOVEL CORONAVIRUS, NAA: SARS-CoV-2, NAA: NOT DETECTED

## 2019-07-15 ENCOUNTER — Other Ambulatory Visit: Payer: Self-pay

## 2019-07-16 ENCOUNTER — Ambulatory Visit (INDEPENDENT_AMBULATORY_CARE_PROVIDER_SITE_OTHER): Payer: Medicare Other | Admitting: Family

## 2019-07-16 ENCOUNTER — Encounter: Payer: Self-pay | Admitting: Family

## 2019-07-16 VITALS — BP 138/82

## 2019-07-16 DIAGNOSIS — Z0289 Encounter for other administrative examinations: Secondary | ICD-10-CM

## 2019-07-16 DIAGNOSIS — F1120 Opioid dependence, uncomplicated: Secondary | ICD-10-CM

## 2019-07-16 DIAGNOSIS — M546 Pain in thoracic spine: Secondary | ICD-10-CM | POA: Diagnosis not present

## 2019-07-16 DIAGNOSIS — E039 Hypothyroidism, unspecified: Secondary | ICD-10-CM

## 2019-07-16 DIAGNOSIS — E785 Hyperlipidemia, unspecified: Secondary | ICD-10-CM

## 2019-07-16 DIAGNOSIS — K5909 Other constipation: Secondary | ICD-10-CM | POA: Diagnosis not present

## 2019-07-16 DIAGNOSIS — G8929 Other chronic pain: Secondary | ICD-10-CM

## 2019-07-16 DIAGNOSIS — A084 Viral intestinal infection, unspecified: Secondary | ICD-10-CM

## 2019-07-16 DIAGNOSIS — F321 Major depressive disorder, single episode, moderate: Secondary | ICD-10-CM

## 2019-07-16 DIAGNOSIS — G4700 Insomnia, unspecified: Secondary | ICD-10-CM

## 2019-07-16 DIAGNOSIS — I10 Essential (primary) hypertension: Secondary | ICD-10-CM

## 2019-07-16 DIAGNOSIS — G894 Chronic pain syndrome: Secondary | ICD-10-CM

## 2019-07-16 DIAGNOSIS — K219 Gastro-esophageal reflux disease without esophagitis: Secondary | ICD-10-CM

## 2019-07-16 DIAGNOSIS — F419 Anxiety disorder, unspecified: Secondary | ICD-10-CM

## 2019-07-16 DIAGNOSIS — J42 Unspecified chronic bronchitis: Secondary | ICD-10-CM

## 2019-07-16 MED ORDER — TRAMADOL HCL 50 MG PO TABS: 100.0000 mg | ORAL_TABLET | Freq: Three times a day (TID) | ORAL | 3 refills | Status: DC | PRN

## 2019-07-16 MED ORDER — DULOXETINE HCL 60 MG PO CPEP: 60.0000 mg | ORAL_CAPSULE | Freq: Every day | ORAL | 3 refills | Status: DC

## 2019-07-16 MED ORDER — LEVOTHYROXINE SODIUM 25 MCG PO TABS: 25.0000 ug | ORAL_TABLET | Freq: Every day | ORAL | 3 refills | Status: DC

## 2019-07-16 MED ORDER — ONDANSETRON HCL 4 MG PO TABS: 4.0000 mg | ORAL_TABLET | Freq: Three times a day (TID) | ORAL | 3 refills | Status: DC | PRN

## 2019-07-16 MED ORDER — AMLODIPINE-OLMESARTAN 10-40 MG PO TABS: 1.0000 | ORAL_TABLET | Freq: Every day | ORAL | 3 refills | Status: DC

## 2019-07-16 NOTE — Progress Notes (Signed)
Virtual Visit via telephone Note Due to COVID-19 pandemic this visit was conducted virtually. This visit type was conducted due to national recommendations for restrictions regarding the COVID-19 Pandemic (e.g. social distancing, sheltering in place) in an effort to limit this patient's exposure and mitigate transmission in our community. All issues noted in this document were discussed and addressed.  A physical exam was not performed with this format.  I connected with Stacy Dalton on 07/16/19 at 10:34 AM  by telephone and verified that I am speaking with the correct person using two identifiers. Stacy Dalton is currently located at home and no one is currently with her during visit. The provider, Evelina Dun, FNP is located in their office at time of visit.  I discussed the limitations, risks, security and privacy concerns of performing an evaluation and management service by telephone and the availability of in person appointments. I also discussed with the patient that there may be a patient responsible charge related to this service. The patient expressed understanding and agreed to proceed.   History and Present Illness:  Ptcallsthe office today for chronic follow up. PT has pulmonologistsfor COPD and chronic cough, but states she has not seen them since COVID.  Hypertension This is a chronic problem. The current episode started more than 1 year ago. The problem has been resolved since onset. The problem is controlled. Associated symptoms include anxiety, malaise/fatigue, palpitations and shortness of breath. Pertinent negatives include no chest pain or peripheral edema. Risk factors for coronary artery disease include dyslipidemia and sedentary lifestyle. The current treatment provides moderate improvement. Hypertensive end-organ damage includes CAD/MI. Identifiable causes of hypertension include a thyroid problem.  Gastroesophageal Reflux She complains of heartburn and a  hoarse voice. She reports no chest pain. This is a chronic problem. The current episode started more than 1 year ago. The problem occurs occasionally. Associated symptoms include fatigue. She has tried a PPI for the symptoms. The treatment provided moderate relief.  Constipation This is a chronic problem. The current episode started more than 1 year ago. The problem has been waxing and waning since onset. Her stool frequency is 4 to 5 times per week. Associated symptoms include back pain.  Thyroid Problem Presents for follow-up visit. Symptoms include anxiety, constipation, fatigue, hoarse voice and palpitations. Patient reports no depressed mood. The symptoms have been stable. Her past medical history is significant for hyperlipidemia.  Anxiety Presents for follow-up visit. Symptoms include excessive worry, insomnia, nervous/anxious behavior, palpitations and shortness of breath. Patient reports no chest pain or depressed mood. Symptoms occur most days. The severity of symptoms is moderate.   Her past medical history is significant for anemia.  Anemia Symptoms include malaise/fatigue and palpitations. Past medical history includes hypothyroidism.  Depression        This is a chronic problem.  The current episode started more than 1 year ago.   The onset quality is gradual.   The problem occurs intermittently.  Associated symptoms include fatigue, helplessness, hopelessness, insomnia, irritable and decreased interest.  Past medical history includes hypothyroidism, thyroid problem and anxiety.   Hyperlipidemia This is a chronic problem. The current episode started more than 1 year ago. The problem is uncontrolled. Recent lipid tests were reviewed and are high. Exacerbating diseases include hypothyroidism. Associated symptoms include shortness of breath. Pertinent negatives include no chest pain. Current antihyperlipidemic treatment includes statins. The current treatment provides mild improvement of  lipids.  Insomnia Primary symptoms: difficulty falling asleep, frequent awakening, malaise/fatigue.  The current episode started more than one year. The onset quality is gradual. PMH includes: depression.  Back Pain This is a chronic problem. The current episode started more than 1 year ago. The problem occurs intermittently. The problem has been waxing and waning since onset. The pain is present in the lumbar spine and thoracic spine. The quality of the pain is described as aching. The pain is at a severity of 9/10. Pertinent negatives include no chest pain. She has tried analgesics for the symptoms. The treatment provided mild relief.  COPD Pt uses Breo uses daily. States her coughing and SOB is better.     Review of Systems  Constitutional: Positive for fatigue and malaise/fatigue.  HENT: Positive for hoarse voice.   Respiratory: Positive for shortness of breath.   Cardiovascular: Positive for palpitations. Negative for chest pain.  Gastrointestinal: Positive for constipation and heartburn.  Musculoskeletal: Positive for back pain.  Psychiatric/Behavioral: Positive for depression. The patient is nervous/anxious and has insomnia.   All other systems reviewed and are negative.    Observations/Objective: No SOB or distress noted   Assessment and Plan: Stacy Dalton comes in today with chief complaint of No chief complaint on file.   Diagnosis and orders addressed:  1. Pain medication agreement signed - traMADol (ULTRAM) 50 MG tablet; Take 2 tablets (100 mg total) by mouth every 8 (eight) hours as needed (pain).  Dispense: 180 tablet; Refill: 3  2. Uncomplicated opioid dependence (HCC) - traMADol (ULTRAM) 50 MG tablet; Take 2 tablets (100 mg total) by mouth every 8 (eight) hours as needed (pain).  Dispense: 180 tablet; Refill: 3  3. Chronic bilateral thoracic back pain - traMADol (ULTRAM) 50 MG tablet; Take 2 tablets (100 mg total) by mouth every 8 (eight) hours as needed  (pain).  Dispense: 180 tablet; Refill: 3  4. Chronic pain syndrome - traMADol (ULTRAM) 50 MG tablet; Take 2 tablets (100 mg total) by mouth every 8 (eight) hours as needed (pain).  Dispense: 180 tablet; Refill: 3  6. Essential hypertension - amLODipine-olmesartan (AZOR) 10-40 MG tablet; Take 1 tablet by mouth daily.  Dispense: 90 tablet; Refill: 3  7. Chronic constipation  8. Gastroesophageal reflux disease without esophagitis  9. Hypothyroidism, unspecified type - levothyroxine (SYNTHROID) 25 MCG tablet; Take 1 tablet (25 mcg total) by mouth daily before breakfast.  Dispense: 90 tablet; Refill: 3  10. Hyperlipidemia, unspecified hyperlipidemia type  11. Anxiet - DULoxetine (CYMBALTA) 60 MG capsule; Take 1 capsule (60 mg total) by mouth daily.  Dispense: 90 capsule; Refill: 3  12. Current moderate episode of major depressive disorder, unspecified whether recurrent (HCC) - DULoxetine (CYMBALTA) 60 MG capsule; Take 1 capsule (60 mg total) by mouth daily.  Dispense: 90 capsule; Refill: 3  13. Insomnia, unspecified type - DULoxetine (CYMBALTA) 60 MG capsule; Take 1 capsule (60 mg total) by mouth daily.  Dispense: 90 capsule; Refill: 3  14. Chronic bronchitis, unspecified chronic bronchitis type ([place])   Labs pending Pt reviewed in [place] controlled database- No red flags noted Health Maintenance reviewed Diet and exercise encouraged  Follow up plan: 3 months     I discussed the assessment and treatment plan with the patient. The patient was provided an opportunity to ask questions and all were answered. The patient agreed with the plan and demonstrated an understanding of the instructions.   The patient was advised to call back or seek an in-person evaluation if the symptoms worsen or if the condition fails to improve as anticipated.  The above assessment and management plan was discussed with the patient. The patient verbalized understanding of and has agreed to the management plan.  Patient is aware to call the clinic if symptoms persist or worsen. Patient is aware when to return to the clinic for a follow-up visit. Patient educated on when it is appropriate to go to the emergency department.   Time call ended:  10:58 AM  I provided 24 minutes of non-face-to-face time during this encounter.    Evelina Dun, FNP

## 2019-07-18 ENCOUNTER — Telehealth: Payer: Self-pay | Admitting: *Deleted

## 2019-07-18 ENCOUNTER — Ambulatory Visit (INDEPENDENT_AMBULATORY_CARE_PROVIDER_SITE_OTHER): Payer: Medicare Other | Admitting: Licensed Clinical Social Worker

## 2019-07-18 ENCOUNTER — Other Ambulatory Visit: Payer: Self-pay | Admitting: Family

## 2019-07-18 DIAGNOSIS — J441 Chronic obstructive pulmonary disease with (acute) exacerbation: Secondary | ICD-10-CM

## 2019-07-18 DIAGNOSIS — F419 Anxiety disorder, unspecified: Secondary | ICD-10-CM

## 2019-07-18 DIAGNOSIS — F321 Major depressive disorder, single episode, moderate: Secondary | ICD-10-CM | POA: Diagnosis not present

## 2019-07-18 DIAGNOSIS — K219 Gastro-esophageal reflux disease without esophagitis: Secondary | ICD-10-CM

## 2019-07-18 DIAGNOSIS — G4700 Insomnia, unspecified: Secondary | ICD-10-CM

## 2019-07-18 DIAGNOSIS — E785 Hyperlipidemia, unspecified: Secondary | ICD-10-CM

## 2019-07-18 DIAGNOSIS — I10 Essential (primary) hypertension: Secondary | ICD-10-CM | POA: Diagnosis not present

## 2019-07-18 DIAGNOSIS — E039 Hypothyroidism, unspecified: Secondary | ICD-10-CM

## 2019-07-18 MED ORDER — ATORVASTATIN CALCIUM 20 MG PO TABS: 20.0000 mg | ORAL_TABLET | Freq: Every day | ORAL | 2 refills | Status: DC

## 2019-07-18 NOTE — Telephone Encounter (Signed)
Pt aware Rx sent to mail order

## 2019-07-18 NOTE — Patient Instructions (Addendum)
Licensed Clinical Social Worker Visit Information  Goals we discussed today:  Goals    . Client would like to talk with someone about managing stress/anxiety issues she faces (pt-stated)     Current Barriers:  . Some Pain issues . Family Stress issues of concern in patient with Chronic Diagnoses of GERD, Anxiety, COPD, Hypthyroidism, HTN, HLD, Depression, Insomnia  Clinical Social Work Clinical Goal(s):  . Client will talk with LCSW in next 30 days to discuss client's management of stress/anxiety issues faced by client  Interventions:  Talked with client about current client needs related to client's management  of anxiety and stress issues faced  Talked with client about client's grief over past death of her spouse, [name].  Talked with client about pain issues of client  Talked with client about social support (son, [name]; son.[name])  Talked with client about CCM program support  Talked with client about sleeping challenges of client  Collaborated with [facility] Triage nurse to discuss nursing needs of client   Patient Self Care Activities:  . Attends all scheduled provider appointments . Performs ADL's independently . Performs IADL's independently  Plan: Client to attend scheduled client medical appointments Client to contact LCSW as needed to discuss anxiety or stress management for client LCSW to call client or [name] in next 4 weeks to talk with client or [name] about client management of anxiety or stress issues  Initial goal documentation             Materials Provided: No  Follow Up Plan: LCSW to call client or [name] in next 4 weeks to talk with client or [name] about client management of anxiety or stress issues  The patient verbalized understanding of instructions provided today and declined a print copy of patient instruction materials.   Norva Riffle.Hilmar Moldovan MSW, LCSW Licensed Clinical Social Worker [facility] [phone]

## 2019-07-18 NOTE — Telephone Encounter (Signed)
What is the name of the medication? Atorvastatin 20 mg  Have you contacted your pharmacy to request a refill? NO  Which pharmacy would you like this sent to? [facility]   Patient notified that their request is being sent to the clinical staff for review and that they should receive a call once it is complete. If they do not receive a call within 24 hours they can check with their pharmacy or our office.

## 2019-07-18 NOTE — Chronic Care Management (AMB) (Addendum)
  Care Management Note   Stacy Dalton is a 69 y.o. year old female who is a primary care patient of Stacy Balloon, FNP. The CM team was consulted for assistance with chronic disease management and care coordination.   I reached out to Stacy Dalton by phone today.   Review of patient status, including review of consultants reports, relevant laboratory and other test results, and collaboration with appropriate care team members and the patient's provider was performed as part of comprehensive patient evaluation and provision of chronic care management services.   Social determinants of health:risk of tobacco use; risk of depression    Office Visit from 04/03/2019 in [place]  PHQ-9 Total Score  8      Medications    albuterol (VENTOLIN HFA) 108 (90 Base) MCG/ACT inhaler amLODipine-olmesartan (AZOR) 10-40 MG tablet Aspirin-Salicylamide-Caffeine (BC HEADACHE POWDER PO) atorvastatin (LIPITOR) 20 MG tablet DULoxetine (CYMBALTA) 60 MG capsule Fluticasone Furoate-Vilanterol (BREO ELLIPTA IN) levothyroxine (SYNTHROID) 25 MCG tablet ondansetron (ZOFRAN) 4 MG tablet temazepam (RESTORIL) 30 MG capsule traMADol (ULTRAM) 50 MG tablet Vitamin D, Ergocalciferol, (DRISDOL) 1.25 MG (50000 UT) CAPS capsule   Goals      Client would like to talk with someone about managing stress/anxiety issues she faces (pt-stated)     Current Barriers:  Some Pain issues Family Stress issues of concern in patient with Chronic Diagnoses of GERD, Anxiety, COPD, Hypthyroidism, HTN, HLD, Depression, Insomnia  Clinical Social Work Clinical Goal(s):  Client will talk with LCSW in next 30 days to discuss client's management of stress/anxiety issues faced by client  Interventions: Talked with client about current client needs related to client's management  of anxiety and stress issues faced Talked with client about client's grief over past death of her spouse, Stacy Dalton. Talked with  client about pain issues of client Talked with client about social support (son, Stacy Dalton; son.Stacy Dalton) Talked with client about CCM program support Talked with client about sleeping challenges of client Collaborated with Stacy Dalton Triage nurse to discuss nursing needs of client  Patient Self Care Activities:  Attends all scheduled provider appointments Performs ADL's independently Performs IADL's independently  Plan: Client to attend scheduled client medical appointments Client to contact LCSW as needed to discuss anxiety or stress management for client LCSW to call client or Stacy Dalton in next 4 weeks to talk with client or Stacy Dalton about client management of anxiety or stress issues  Initial goal documentation       Follow Up Plan:  LCSW to call client or her son in next 4 weeks to talk with client or her son about client management of anxiety or stress issues faced  Stacy Dalton.Stacy Dalton MSW, LCSW Licensed Clinical Social Worker [facility] Care [facility] [phone]  I have reviewed and agree with the above documentation.   Evelina Dun, FNP

## 2019-07-18 NOTE — Telephone Encounter (Signed)
Patient states to Social Worker Nicki Reaper Forrest that she is having trouble sleeping at night due to her legs "jumping".  Patient also states that she previously took requip and would like a refill on this medication or would like to have something sent into her pharmacy for restless leg

## 2019-07-21 MED ORDER — ROPINIROLE HCL 2 MG PO TABS: 2.0000 mg | ORAL_TABLET | Freq: Every day | ORAL | 1 refills | Status: DC

## 2019-07-21 NOTE — Telephone Encounter (Signed)
Requip 2 mg at bed time.

## 2019-07-21 NOTE — Telephone Encounter (Signed)
Pt aware rx called into pharmacy.

## 2019-07-21 NOTE — Addendum Note (Signed)
Addended by: Evelina Dun A on: 07/21/2019 08:10 AM   Modules accepted: Orders

## 2019-07-24 ENCOUNTER — Telehealth: Payer: Self-pay | Admitting: Family

## 2019-07-24 DIAGNOSIS — J42 Unspecified chronic bronchitis: Secondary | ICD-10-CM

## 2019-07-24 NOTE — Telephone Encounter (Signed)
Requesting machine for breathing treatments.

## 2019-07-24 NOTE — Telephone Encounter (Signed)
Patient called requesting to speak with someone about getting a breathing machine. Patient says her breathing isn't getting worse, but feels like it could be better.

## 2019-07-25 MED ORDER — ALBUTEROL SULFATE (2.5 MG/3ML) 0.083% IN NEBU: 2.5000 mg | INHALATION_SOLUTION | Freq: Four times a day (QID) | RESPIRATORY_TRACT | 1 refills | Status: DC | PRN

## 2019-07-25 NOTE — Telephone Encounter (Signed)
Please fax for patient.

## 2019-07-25 NOTE — Telephone Encounter (Signed)
Nebulizer faxed to [facility]

## 2019-08-13 ENCOUNTER — Ambulatory Visit (INDEPENDENT_AMBULATORY_CARE_PROVIDER_SITE_OTHER): Payer: Medicare Other | Admitting: Licensed Clinical Social Worker

## 2019-08-13 DIAGNOSIS — J441 Chronic obstructive pulmonary disease with (acute) exacerbation: Secondary | ICD-10-CM | POA: Diagnosis not present

## 2019-08-13 DIAGNOSIS — E039 Hypothyroidism, unspecified: Secondary | ICD-10-CM

## 2019-08-13 DIAGNOSIS — E785 Hyperlipidemia, unspecified: Secondary | ICD-10-CM | POA: Diagnosis not present

## 2019-08-13 DIAGNOSIS — I10 Essential (primary) hypertension: Secondary | ICD-10-CM

## 2019-08-13 DIAGNOSIS — F419 Anxiety disorder, unspecified: Secondary | ICD-10-CM

## 2019-08-13 DIAGNOSIS — F321 Major depressive disorder, single episode, moderate: Secondary | ICD-10-CM

## 2019-08-13 DIAGNOSIS — K219 Gastro-esophageal reflux disease without esophagitis: Secondary | ICD-10-CM

## 2019-08-13 DIAGNOSIS — G4700 Insomnia, unspecified: Secondary | ICD-10-CM

## 2019-08-13 NOTE — Chronic Care Management (AMB) (Addendum)
  Care Management Note   Stacy Dalton is a 69 y.o. year old female who is a primary care patient of Stacy Balloon, FNP. The CM team was consulted for assistance with chronic disease management and care coordination.   I reached out to Stacy Dalton , son,  by phone today.   Review of patient status, including review of consultants reports, relevant laboratory and other test results, and collaboration with appropriate care team members and the patient's provider was performed as part of comprehensive patient evaluation and provision of chronic care management services.   Social determinants of health: risk of tobacco use; risk of depression    Office Visit from 04/03/2019 in [place]  PHQ-9 Total Score  8      Medications    (very important)  New medications from outside sources are available for reconciliation   albuterol (PROVENTIL) (2.5 MG/3ML) 0.083% nebulizer solution albuterol (VENTOLIN HFA) 108 (90 Base) MCG/ACT inhaler amLODipine-olmesartan (AZOR) 10-40 MG tablet Aspirin-Salicylamide-Caffeine (BC HEADACHE POWDER PO) atorvastatin (LIPITOR) 20 MG tablet DULoxetine (CYMBALTA) 60 MG capsule Fluticasone Furoate-Vilanterol (BREO ELLIPTA IN) levothyroxine (SYNTHROID) 25 MCG tablet ondansetron (ZOFRAN) 4 MG tablet rOPINIRole (REQUIP) 2 MG tablet temazepam (RESTORIL) 30 MG capsule traMADol (ULTRAM) 50 MG tablet Vitamin D, Ergocalciferol, (DRISDOL) 1.25 MG (50000 UT) CAPS capsule  Goals      Client would like to talk with someone about managing stress/anxiety issues she faces (pt-stated)     Current Barriers:  Some Pain issues Family Stress issues of concern in patient with Chronic Diagnoses of GERD, Anxiety, COPD, Hypthyroidism, HTN, HLD, Depression, Insomnia  Clinical Social Work Clinical Goal(s):  Client will talk with LCSW in next 30 days to discuss client's management of stress/anxiety issues faced by client  Interventions:  Previously talked with client about current client needs related to client's management  of anxiety and stress issues faced Previously talked with client about client's grief over past death of her spouse, Stacy Dalton. Talked with client previously about pain issues of client Talked with client previously about social support (son, Stacy Dalton; son.Stacy Dalton) Previously talked with client about sleeping challenges of client  Patient Self Care Activities:  Attends all scheduled provider appointments Performs ADL's independently Performs IADL's independently  Plan: Client to attend scheduled client medical appointments Client to contact LCSW as needed to discuss anxiety or stress management for client LCSW to call client or Stacy Dalton in next 4 weeks to talk with client or Stacy Dalton about client management of anxiety or stress issues  Initial goal documentation         Follow Up Plan: LCSW to call [name] in next 4 weeks to talk with [name] about client management of anxiety or stress issues   Norva Riffle.Stacy Dalton MSW, LCSW Licensed Clinical Social Worker [facility] [phone]  I have reviewed and agree with the above  documentation.   Evelina Dun, FNP

## 2019-08-13 NOTE — Patient Instructions (Addendum)
Licensed Clinical Social Worker Visit Information  Goals we discussed today:  Goals    . Client would like to talk with someone about managing stress/anxiety issues she faces (pt-stated)     Current Barriers:  . Some Pain issues . Family Stress issues of concern in patient with Chronic Diagnoses of GERD, Anxiety, COPD, Hypthyroidism, HTN, HLD, Depression, Insomnia  Clinical Social Work Clinical Goal(s):  . Client will talk with LCSW in next 30 days to discuss client's management of stress/anxiety issues faced by client  Interventions:  Previously talked withclientabout current client needs related to client's management  of anxiety and stress issues faced  Previously talked withclientabout client's grief over past death of her spouse, [name].  Talked with client previously about pain issues of client  Talked with client previously about social support (son, [name]; son.Tommy)  Previously talked with client about sleeping challenges of client  Patient Self Care Activities:  . Attends all scheduled provider appointments . Performs ADL's independently . Performs IADL's independently  Plan: Client to attend scheduled client medical appointments Client to contact LCSW as needed to discuss anxiety or stress management for client LCSW to call client or [name] in next 4 weeks to talk with client or [name] about client management of anxiety or stress issues  Initial goal documentation               Materials Provided: No  Follow Up Plan:  LCSW to call client or [name] in next 4 weeks to talk with client or [name] about client management of anxiety or stress issues faced  The patient/Stacy Dalton, son, verbalized understanding of instructions provided today and declined a print copy of patient instruction materials.   Norva Riffle.Kimesha Claxton MSW, LCSW Licensed Clinical Social Worker [facility] [phone]

## 2019-08-23 ENCOUNTER — Telehealth (INDEPENDENT_AMBULATORY_CARE_PROVIDER_SITE_OTHER): Payer: Medicare Other | Admitting: Family Medicine

## 2019-08-23 DIAGNOSIS — R399 Unspecified symptoms and signs involving the genitourinary system: Secondary | ICD-10-CM | POA: Diagnosis not present

## 2019-08-23 MED ORDER — CIPROFLOXACIN HCL 500 MG PO TABS: 500.0000 mg | ORAL_TABLET | Freq: Two times a day (BID) | ORAL | 0 refills | Status: DC

## 2019-08-23 NOTE — Telephone Encounter (Signed)
@LOGO @  Virtual Visit via telephone Note  I connected with Stacy Dalton on 08/23/19 at 1430 by telephone and verified that I am speaking with the correct person using two identifiers. Stacy Dalton is currently located at home and no other people are currently with her during visit. The provider, Fransisca Kaufmann Eissa Buchberger, MD is located in their office at time of visit.  Call ended at 1436  I discussed the limitations, risks, security and privacy concerns of performing an evaluation and management service by telephone and the availability of in person appointments. I also discussed with the patient that there may be a patient responsible charge related to this service. The patient expressed understanding and agreed to proceed.   History and Present Illness: Patient is calling in for dysuria and frequency that has been going on for the past 3 days.  She does get this frequently and it is worse today.  She denies any fevers or flank pain.  She usually takes cipro.  She is increasing fluid intake.   No diagnosis found.  Outpatient Encounter Medications as of 08/23/2019  Medication Sig  . albuterol (PROVENTIL) (2.5 MG/3ML) 0.083% nebulizer solution Take 3 mLs (2.5 mg total) by nebulization every 6 (six) hours as needed for wheezing or shortness of breath.  [name] albuterol (VENTOLIN HFA) 108 (90 Base) MCG/ACT inhaler Inhale 2 puffs into the lungs every 6 (six) hours as needed for wheezing or shortness of breath.   [name] amLODipine-olmesartan (AZOR) 10-40 MG tablet Take 1 tablet by mouth daily.  . Aspirin-Salicylamide-Caffeine (BC HEADACHE POWDER PO) Take 1 Dose by mouth daily as needed.  [name] atorvastatin (LIPITOR) 20 MG tablet Take 1 tablet (20 mg total) by mouth daily.  . DULoxetine (CYMBALTA) 60 MG capsule Take 1 capsule (60 mg total) by mouth daily.  . Fluticasone Furoate-Vilanterol (BREO ELLIPTA IN) Inhale 1 puff into the lungs daily.  [name] levothyroxine (SYNTHROID) 25 MCG tablet Take 1 tablet (25 mcg  total) by mouth daily before breakfast.  . ondansetron (ZOFRAN) 4 MG tablet Take 1 tablet (4 mg total) by mouth every 8 (eight) hours as needed for nausea.  [name] rOPINIRole (REQUIP) 2 MG tablet Take 1 tablet (2 mg total) by mouth at bedtime.  . temazepam (RESTORIL) 30 MG capsule Take 1 capsule (30 mg total) by mouth at bedtime as needed for sleep.  . traMADol (ULTRAM) 50 MG tablet Take 2 tablets (100 mg total) by mouth every 8 (eight) hours as needed (pain).  . Vitamin D, Ergocalciferol, (DRISDOL) 1.25 MG (50000 UT) CAPS capsule TAKE 1 CAPSULE (50000 UNITS) BY MOUTH EVERY 7 DAYS   No facility-administered encounter medications on file as of 08/23/2019.    Review of Systems - General ROS: negative for - chills, fever or night sweats Genito-Urinary ROS: positive for - dysuria and urinary frequency/urgency negative for - hematuria, pelvic pain or vulvar/vaginal symptoms   Observations/Objective: Patient sounds comfortable and in no acute distress  Assessment and Plan: Problem List Items Addressed This Visit    None    Visit Diagnoses    UTI symptoms    -  Primary   Relevant Medications   ciprofloxacin (CIPRO) 500 MG tablet      Will treat symptomatically Follow up plan: No follow-ups on file.     I discussed the assessment and treatment plan with the patient. The patient was provided an opportunity to ask questions and all were answered. The patient agreed with the plan and demonstrated an understanding of the  instructions.   The patient was advised to call back or seek an in-person evaluation if the symptoms worsen or if the condition fails to improve as anticipated.  The above assessment and management plan was discussed with the patient. The patient verbalized understanding of and has agreed to the management plan. Patient is aware to call the clinic if symptoms persist or worsen. Patient is aware when to return to the clinic for a follow-up visit. Patient educated on when it is  appropriate to go to the emergency department.    I provided 6 minutes of non-face-to-face time during this encounter.    Worthy Rancher, MD

## 2019-09-02 ENCOUNTER — Ambulatory Visit (INDEPENDENT_AMBULATORY_CARE_PROVIDER_SITE_OTHER): Payer: Medicare Other | Admitting: *Deleted

## 2019-09-02 VITALS — Ht 61.0 in | Wt 108.0 lb

## 2019-09-02 DIAGNOSIS — Z0000 Encounter for general adult medical examination without abnormal findings: Secondary | ICD-10-CM

## 2019-09-02 NOTE — Progress Notes (Addendum)
MEDICARE ANNUAL WELLNESS VISIT  09/02/2019  Telephone Visit Disclaimer This Medicare AWV was conducted by telephone due to national recommendations for restrictions regarding the COVID-19 Pandemic (e.g. social distancing).  I verified, using two identifiers, that I am speaking with Stacy Dalton or their authorized healthcare agent. I discussed the limitations, risks, security, and privacy concerns of performing an evaluation and management service by telephone and the potential availability of an in-person appointment in the future. The patient expressed understanding and agreed to proceed.   Subjective:  Stacy Dalton is a 69 y.o. female patient of Hawks, Theador Hawthorne, FNP who had a Medicare Annual Wellness Visit today via telephone. Stacy Dalton is Retired and lives alone. she has 5 children. she reports that she is not socially active and does interact with friends/family regularly. she is minimally physically active and enjoys word searches.  Patient Care Team: [name], FNP as PCP - General (Family Medicine) Marshell Garfinkel, MD as Consulting Physician (Pulmonary Disease) Ilean China, RN as Registered Nurse Shea Evans, Norva Riffle, LCSW as Social Worker (Licensed Clinical Social Worker)  Advanced Directives 09/02/2019 09/26/2018 09/20/2018 09/19/2018 07/20/2018 05/23/2018 05/15/2017  Does Patient Have a Medical Advance Directive? No No - No No No No  Would patient like information on creating a medical advance directive? Yes (MAU/Ambulatory/Procedural Areas - Information given) Yes (MAU/Ambulatory/Procedural Areas - Information given) No - Patient declined - No - Patient declined Yes (MAU/Ambulatory/Procedural Areas - Information given) (No Data)  Pre-existing out of facility DNR order (yellow form or pink MOST form) - - - - - - -    Hospital Utilization Over the Past 12 Months: # of hospitalizations or ER visits: 1 # of surgeries: 0  Review of Systems    Patient reports that  her overall health is worse compared to last year.  History obtained from chart review and the patient General ROS: positive for  - Mid Back pain  Patient Reported Readings (BP, Pulse, CBG, Weight, etc) none  Pain Assessment Pain : 0-10 Pain Score: 8  Pain Type: Chronic pain Pain Location: Back Pain Orientation: Mid, Upper Pain Descriptors / Indicators: Aching, Constant Pain Onset: More than a month ago Pain Frequency: Constant Pain Relieving Factors: Heating Pad, Pain Medication  Pain Relieving Factors: Heating Pad, Pain Medication  Current Medications & Allergies (verified) Allergies as of 09/02/2019       Reactions   Toradol [ketorolac Tromethamine] Shortness Of Breath   Butalbital-apap-caffeine Other (See Comments)   jittery   Demerol Swelling   Esgic [butalbital-apap-caffeine] Other (See Comments)   jittery   Iodine Other (See Comments)   bp bottomed out per pt several hours later; unsure if pre medicated in past with cm; done in [place]        Medication List        Accurate as of September 02, 2019 10:53 AM. If you have any questions, ask your nurse or doctor.          amLODipine-olmesartan 10-40 MG tablet Commonly known as: AZOR Take 1 tablet by mouth daily.   atorvastatin 20 MG tablet Commonly known as: Lipitor Take 1 tablet (20 mg total) by mouth daily.   BC HEADACHE POWDER PO Take 1 Dose by mouth daily as needed.   BREO ELLIPTA IN Inhale 1 puff into the lungs daily.   ciprofloxacin 500 MG tablet Commonly known as: Cipro Take 1 tablet (500 mg total) by mouth 2 (two) times daily.   DULoxetine 60  MG capsule Commonly known as: CYMBALTA Take 1 capsule (60 mg total) by mouth daily.   levothyroxine 25 MCG tablet Commonly known as: SYNTHROID Take 1 tablet (25 mcg total) by mouth daily before breakfast.   ondansetron 4 MG tablet Commonly known as: ZOFRAN Take 1 tablet (4 mg total) by mouth every 8 (eight) hours as needed for nausea.   rOPINIRole 2  MG tablet Commonly known as: Requip Take 1 tablet (2 mg total) by mouth at bedtime.   temazepam 30 MG capsule Commonly known as: RESTORIL Take 1 capsule (30 mg total) by mouth at bedtime as needed for sleep.   traMADol 50 MG tablet Commonly known as: ULTRAM Take 2 tablets (100 mg total) by mouth every 8 (eight) hours as needed (pain).   Ventolin HFA 108 (90 Base) MCG/ACT inhaler Generic drug: albuterol Inhale 2 puffs into the lungs every 6 (six) hours as needed for wheezing or shortness of breath.   albuterol (2.5 MG/3ML) 0.083% nebulizer solution Commonly known as: PROVENTIL Take 3 mLs (2.5 mg total) by nebulization every 6 (six) hours as needed for wheezing or shortness of breath.   Vitamin D (Ergocalciferol) 1.25 MG (50000 UNIT) Caps capsule Commonly known as: DRISDOL TAKE 1 CAPSULE (50000 UNITS) BY MOUTH EVERY 7 DAYS        History (reviewed): Past Medical History:  Diagnosis Date   Chronic pain    COPD (chronic obstructive pulmonary disease) ([place])    told has copd, no current inhaler use   Cough    Depression    GERD (gastroesophageal reflux disease)    Hypertension    Hypothyroidism    Insomnia    Migraine    Migraine    Osteopenia    Pain management    Panic attacks    Small bowel obstruction ([place])    Past Surgical History:  Procedure Laterality Date   ABDOMINAL HYSTERECTOMY     COLON SURGERY     COLOSTOMY CLOSURE  04/19/2012   Procedure: COLOSTOMY CLOSURE;  Surgeon: Adin Hector, MD;  Location: [facility];  Service: General;  Laterality: N/A;  Laparotomy, Resection and Closure of Colostomy   COLOSTOMY TAKEDOWN N/A 04/12/2016   Procedure: [place] TAKEDOWN;  Surgeon: Johnathan Hausen, MD;  Location: [facility];  Service: General;  Laterality: N/A;   INCONTINENCE SURGERY     LAPAROTOMY  10/04/2011, colostomy also   Procedure: EXPLORATORY LAPAROTOMY;  Surgeon: Adin Hector, MD;  Location: [facility];  Service: General;  Laterality: N/A;  left partial colectomy  with colostomy   LAPAROTOMY  04/19/2012   Procedure: EXPLORATORY LAPAROTOMY;  Surgeon: Adin Hector, MD;  Location: [facility];  Service: General;  Laterality: N/A;   LAPAROTOMY N/A 11/29/2015   Procedure: EXPLORATORY LAPAROTOMY; SUBTOTAL COLECTOMY WITH HARTMAN PROCEDURE AND END COLOSTOMY;  Surgeon: Johnathan Hausen, MD;  Location: [facility];  Service: General;  Laterality: N/A;   TUBAL LIGATION     VENTRAL HERNIA REPAIR  04/19/2012   Procedure: HERNIA REPAIR VENTRAL ADULT;  Surgeon: Adin Hector, MD;  Location: [facility];  Service: General;  Laterality: N/A;   Family History  Problem Relation Age of Onset   Heart disease Mother    Hypertension Mother    Cancer Sister        sinus   Diabetes Brother    Heart disease Brother    Thyroid disease Brother    Cancer Sister        abdominal ?   Thyroid disease Sister  Cancer Other        GE junction adenocarcinoma   Emphysema Father    Stroke Father    Hypertension Father    Heart disease Father    COPD Sister    Thyroid disease Sister    Thyroid disease Sister    Diabetes Brother    Thyroid disease Brother    Thyroid disease Brother    Hypertension Brother    Post-traumatic stress disorder Brother    COPD Brother    Thyroid disease Brother    Thyroid disease Brother    Hypertension Brother    Transient ischemic attack Brother    Social History   Socioeconomic History   Marital status: Widowed    Spouse name: Not on file   Number of children: 5   Years of education: 8   Highest education level: 8th grade  Occupational History   Occupation: Retired  Tobacco Use   Smoking status: Former Smoker    Packs/day: 1.50    Years: 20.00    Pack years: 30.00    Types: Cigarettes    Quit date: 10/04/1998    Years since quitting: 20.9   Smokeless tobacco: Never Used  Substance and Sexual Activity   Alcohol use: No   Drug use: No   Sexual activity: Not Currently  Other Topics Concern   Not on file  Social History Narrative    Not on file   Social Determinants of Health   Financial Resource Strain: Low Risk    Difficulty of Paying Living Expenses: Not hard at all  Food Insecurity: No Food Insecurity   Worried About Charity fundraiser in the Last Year: Never true   [place] in the Last Year: Never true  Transportation Needs: No Transportation Needs   Lack of Transportation (Medical): No   Lack of Transportation (Non-Medical): No  Physical Activity: Inactive   Days of Exercise per Week: 0 days   Minutes of Exercise per Session: 0 min  Stress: No Stress Concern Present   Feeling of Stress : Only a little  Social Connections: Moderately Isolated   Frequency of Communication with Friends and Family: More than three times a week   Frequency of Social Gatherings with Friends and Family: Never   Attends Religious Services: Never   Marine scientist or Organizations: No   Attends Archivist Meetings: Never   Marital Status: Widowed    Activities of Daily Living In your present state of health, do you have any difficulty performing the following activities: 09/02/2019 09/26/2018  Hearing? Y N  Vision? N N  Difficulty concentrating or making decisions? Y N  Walking or climbing stairs? N N  Dressing or bathing? N N  Doing errands, shopping? N N  Preparing Food and eating ? N N  Using the Toilet? N N  In the past six months, have you accidently leaked urine? N N  Do you have problems with loss of bowel control? N N  Managing your Medications? N N  Managing your Finances? N N  Housekeeping or managing your Housekeeping? N N  Some recent data might be hidden    Patient Education/ Literacy How often do you need to have someone help you when you read instructions, pamphlets, or other written materials from your doctor or pharmacy?: 1 - Never What is the last grade level you completed in school?: 7th Grade  Exercise Current Exercise Habits: Home exercise routine, Type of exercise:  walking,  Time (Minutes): 30, Frequency (Times/Week): 7, Weekly Exercise (Minutes/Week): 210, Intensity: Mild, Exercise limited by: orthopedic condition(s)  Diet Patient reports consuming 2 meals a day and 3 snack(s) a day Patient reports that her primary diet is: Regular Patient reports that she does have regular access to food.   Depression Screen PHQ 2/9 Scores 09/02/2019 04/03/2019 04/03/2019 09/30/2018 08/14/2018 06/24/2018 05/23/2018  PHQ - 2 Score 6 4 0 1 1 2 2   PHQ- 9 Score 7 8 - - - 9 6  Exception Documentation - - - - - - -     Fall Risk Fall Risk  09/02/2019 04/03/2019 10/18/2018 09/30/2018 06/24/2018  Falls in the past year? 0 0 (No Data) 0 1  Comment - - Patient denies new/ recent falls - -  Number falls in past yr: 0 - - - 0  Injury with Fall? 0 - - - 0  Comment - - - - -  Risk for fall due to : No Fall Risks - - - -  Follow up Falls evaluation completed - Falls prevention discussed - -     Objective:  Stacy Dalton seemed alert and oriented and she participated appropriately during our telephone visit.  Blood Pressure Weight BMI  BP Readings from Last 3 Encounters:  07/16/19 138/82  04/03/19 139/74  09/30/18 137/77   Wt Readings from Last 3 Encounters:  09/02/19 108 lb 0.4 oz (49 kg)  04/03/19 108 lb (49 kg)  09/30/18 104 lb 3.2 oz (47.3 kg)   BMI Readings from Last 1 Encounters:  09/02/19 20.41 kg/m    *Unable to obtain current vital signs, weight, and BMI due to telephone visit type  Hearing/Vision  Aleshka did  seem to have difficulty with hearing/understanding during the telephone conversation Reports that she has not had a formal eye exam by an eye care professional within the past year Reports that she has not had a formal hearing evaluation within the past year *Unable to fully assess hearing and vision during telephone visit type  Cognitive Function: 6CIT Screen 09/02/2019  What Year? 0 points  What month? 0 points  What time? 0 points  Count back  from 20 0 points  Months in reverse 0 points  Repeat phrase 4 points  Total Score 4   (Normal:0-7, Significant for Dysfunction: >8)  Normal Cognitive Function Screening: Yes   Immunization & Health Maintenance Record Immunization History  Administered Date(s) Administered   Fluad Quad(high Dose 65+) 05/27/2019   Influenza Whole 04/29/2012   Influenza, High Dose Seasonal PF 05/21/2018   Influenza,inj,Quad PF,6+ Mos 05/05/2013, 06/10/2014, 05/13/2015, 05/05/2016    Health Maintenance  Topic Date Due   TETANUS/TDAP  07/17/1967   MAMMOGRAM  07/16/1998   DEXA SCAN  12/06/2018   PNA vac Low Risk Adult (1 of 2 - PCV13) 11/17/2020 (Originally 07/16/2013)   COLONOSCOPY  02/27/2022   INFLUENZA VACCINE  Completed   Hepatitis C Screening  Completed       Assessment  This is a routine wellness examination for Stacy Dalton.  Health Maintenance: Due or Overdue Health Maintenance Due  Topic Date Due   TETANUS/TDAP  07/17/1967   MAMMOGRAM  07/16/1998   DEXA SCAN  12/06/2018    Stacy Dalton does not need a referral for Community Assistance: Care Management:   no Social Work:    no Prescription Assistance:  no Nutrition/Diabetes Education:  no   Plan:  Personalized Goals Goals Addressed   None    Personalized Health  Maintenance & Screening Recommendations  Td vaccine Screening mammography Advanced directives: has NO advanced directive  - add't info requested. Referral to SW: no Shingrix and Pneumonia Vaccine  Lung Cancer Screening Recommended: no (Low Dose CT Chest recommended if Age 44-80 years, 30 pack-year currently smoking OR have quit w/in past 15 years) Hepatitis C Screening recommended: yes HIV Screening recommended: yes  Advanced Directives: Written information was prepared per patient's request.  Referrals & Orders No orders of the defined types were placed in this encounter.   Follow-up Plan Follow-up with [name], FNP as planned      I have personally reviewed and noted the following in the patient's chart:   Medical and social history Use of alcohol, tobacco or illicit drugs  Current medications and supplements Functional ability and status Nutritional status Physical activity Advanced directives List of other physicians Hospitalizations, surgeries, and ER visits in previous 12 months Vitals Screenings to include cognitive, depression, and falls Referrals and appointments  In addition, I have reviewed and discussed with Stacy Dalton certain preventive protocols, quality metrics, and best practice recommendations. A written personalized care plan for preventive services as well as general preventive health recommendations is available and can be mailed to the patient at her request.      Wardell Heath, LPN  [id number]   I have reviewed and agree with the above AWV documentation.   Evelina Dun, FNP

## 2019-09-02 NOTE — Patient Instructions (Signed)
Stacy Dalton , Thank you for taking time to come for your Medicare Wellness Visit. I appreciate your ongoing commitment to your health goals. Please review the following plan we discussed and let me know if I can assist you in the future.   These are the goals we discussed: Goals    . Client would like to talk with someone about managing stress/anxiety issues she faces (pt-stated)     Current Barriers:  . Some Pain issues . Family Stress issues of concern in patient with Chronic Diagnoses of GERD, Anxiety, COPD, Hypthyroidism, HTN, HLD, Depression, Insomnia  Clinical Social Work Clinical Goal(s):  . Client will talk with LCSW in next 30 days to discuss client's management of stress/anxiety issues faced by client  Interventions: . Provided patient Stacy Dalton, son with information about CCM program . Collaborated with RNCM regarding client nursing needs . Talked with Stacy Dalton about current client needs related to client's management .  of anxiety and stress issues faced . Talked with Stacy Dalton about client's challenges with sleeping . Talked with Stacy Dalton about client's grief over past death of her spouse, Stacy Dalton.  Patient Self Care Activities:  . Attends all scheduled provider appointments . Performs ADL's independently . Performs IADL's independently  Plan: Client to attend scheduled client medical appointments Client to contact LCSW as needed to discuss anxiety or stress management for client LCSW to call client or Stacy Dalton in next 3 weeks to talk with client or Stacy Dalton about client management of anxiety or stress issues  Initial goal documentation     . Exercise 3x per week (30 min per time)     Increase walking for exercise.     . Have 3 meals a day     Please work on having 3 balanced meals per day including mostly lean proteins, vegetables, fruits, and whole grains.        This is a list of the screening recommended for you and due dates:  Health Maintenance    Topic Date Due  . Tetanus Vaccine  07/17/1967  . Mammogram  07/16/1998  . DEXA scan (bone density measurement)  12/06/2018  . Pneumonia vaccines (1 of 2 - PCV13) 11/17/2020*  . Colon Cancer Screening  02/27/2022  . Flu Shot  Completed  .  Hepatitis C: One time screening is recommended by Center for Disease Control  (CDC) for  adults born from [birth year] through 1967.   Completed  *Topic was postponed. The date shown is not the original due date.     Advance Directive  Advance directives are legal documents that let you make choices ahead of time about your health care and medical treatment in case you become unable to communicate for yourself. Advance directives are a way for you to make known your wishes to family, friends, and health care providers. This can let others know about your end-of-life care if you become unable to communicate. Discussing and writing advance directives should happen over time rather than all at once. Advance directives can be changed depending on your situation and what you want, even after you have signed the advance directives. There are different types of advance directives, such as:  Medical power of attorney.  Living will.  Do not resuscitate (DNR) or do not attempt resuscitation (DNAR) order. Health care proxy and medical power of attorney A health care proxy is also called a health care agent. This is a person who is appointed to make medical decisions for you  in cases where you are unable to make the decisions yourself. Generally, people choose someone they know well and trust to represent their preferences. Make sure to ask this person for an agreement to act as your proxy. A proxy may have to exercise judgment in the event of a medical decision for which your wishes are not known. A medical power of attorney is a legal document that names your health care proxy. Depending on the laws in your state, after the document is written, it may also need to  be:  Signed.  Notarized.  Dated.  Copied.  Witnessed.  Incorporated into your medical record. You may also want to appoint someone to manage your money in a situation in which you are unable to do so. This is called a durable power of attorney for finances. It is a separate legal document from the durable power of attorney for health care. You may choose the same person or someone different from your health care proxy to act as your agent in money matters. If you do not appoint a proxy, or if there is a concern that the proxy is not acting in your best interests, a court may appoint a guardian to act on your behalf. Living will A living will is a set of instructions that state your wishes about medical care when you cannot express them yourself. Health care providers should keep a copy of your living will in your medical record. You may want to give a copy to family members or friends. To alert caregivers in case of an emergency, you can place a card in your wallet to let them know that you have a living will and where they can find it. A living will is used if you become:  Terminally ill.  Disabled.  Unable to communicate or make decisions. Items to consider in your living will include:  To use or not to use life-support equipment, such as dialysis machines and breathing machines (ventilators).  A DNR or DNAR order. This tells health care providers not to use cardiopulmonary resuscitation (CPR) if breathing or heartbeat stops.  To use or not to use tube feeding.  To be given or not to be given food and fluids.  Comfort (palliative) care when the goal becomes comfort rather than a cure.  Donation of organs and tissues. A living will does not give instructions for distributing your money and property if you should pass away. DNR or DNAR A DNR or DNAR order is a request not to have CPR in the event that your heart stops beating or you stop breathing. If a DNR or DNAR order has not  been made and shared, a health care provider will try to help any patient whose heart has stopped or who has stopped breathing. If you plan to have surgery, talk with your health care provider about how your DNR or DNAR order will be followed if problems occur. What if I do not have an advance directive? If you do not have an advance directive, some states assign family decision makers to act on your behalf based on how closely you are related to them. Each state has its own laws about advance directives. You may want to check with your health care provider, attorney, or state representative about the laws in your state. Summary  Advance directives are the legal documents that allow you to make choices ahead of time about your health care and medical treatment in case you become unable to tell  others about your care.  The process of discussing and writing advance directives should happen over time. You can change the advance directives, even after you have signed them.  Advance directives include DNR or DNAR orders, living wills, and designating an agent as your medical power of attorney. This information is not intended to replace advice given to you by your health care provider. Make sure you discuss any questions you have with your health care provider. Document Revised: 01/16/2019 Document Reviewed: 01/16/2019 Else[employer] Patient Education  [employer].

## 2019-09-09 ENCOUNTER — Telehealth: Payer: Medicare Other

## 2019-09-26 ENCOUNTER — Telehealth: Payer: Self-pay

## 2019-09-27 ENCOUNTER — Encounter: Payer: Self-pay | Admitting: Family Medicine

## 2019-10-11 ENCOUNTER — Encounter (HOSPITAL_COMMUNITY): Payer: Self-pay | Admitting: Emergency Medicine

## 2019-10-11 ENCOUNTER — Emergency Department (HOSPITAL_COMMUNITY): Payer: Medicare Other

## 2019-10-11 ENCOUNTER — Other Ambulatory Visit: Payer: Self-pay

## 2019-10-11 ENCOUNTER — Emergency Department (HOSPITAL_COMMUNITY)
Admission: EM | Admit: 2019-10-11 | Discharge: 2019-10-11 | Disposition: A | Discharge status: Home / Self Care | Payer: Medicare Other | Attending: Emergency Medicine | Admitting: Emergency Medicine

## 2019-10-11 DIAGNOSIS — I10 Essential (primary) hypertension: Secondary | ICD-10-CM | POA: Diagnosis not present

## 2019-10-11 DIAGNOSIS — J449 Chronic obstructive pulmonary disease, unspecified: Secondary | ICD-10-CM | POA: Insufficient documentation

## 2019-10-11 DIAGNOSIS — E039 Hypothyroidism, unspecified: Secondary | ICD-10-CM | POA: Diagnosis not present

## 2019-10-11 DIAGNOSIS — R42 Dizziness and giddiness: Secondary | ICD-10-CM | POA: Diagnosis not present

## 2019-10-11 DIAGNOSIS — F419 Anxiety disorder, unspecified: Secondary | ICD-10-CM | POA: Insufficient documentation

## 2019-10-11 DIAGNOSIS — N3000 Acute cystitis without hematuria: Secondary | ICD-10-CM | POA: Diagnosis not present

## 2019-10-11 DIAGNOSIS — R52 Pain, unspecified: Secondary | ICD-10-CM | POA: Diagnosis not present

## 2019-10-11 DIAGNOSIS — I491 Atrial premature depolarization: Secondary | ICD-10-CM | POA: Diagnosis not present

## 2019-10-11 DIAGNOSIS — R296 Repeated falls: Secondary | ICD-10-CM | POA: Diagnosis not present

## 2019-10-11 DIAGNOSIS — R531 Weakness: Secondary | ICD-10-CM | POA: Insufficient documentation

## 2019-10-11 DIAGNOSIS — E876 Hypokalemia: Secondary | ICD-10-CM | POA: Diagnosis not present

## 2019-10-11 DIAGNOSIS — R000 Tachycardia, unspecified: Secondary | ICD-10-CM | POA: Diagnosis not present

## 2019-10-11 LAB — CBC WITH DIFFERENTIAL/PLATELET
Abs Immature Granulocytes: 0.1 10*3/uL — ABNORMAL HIGH (ref 0.00–0.07)
Basophils Absolute: 0.1 10*3/uL (ref 0.0–0.1)
Basophils Relative: 1 %
Eosinophils Absolute: 0.2 10*3/uL (ref 0.0–0.5)
Eosinophils Relative: 2 %
HCT: 41.7 % (ref 36.0–46.0)
Hemoglobin: 13.7 g/dL (ref 12.0–15.0)
Immature Granulocytes: 1 %
Lymphocytes Relative: 13 %
Lymphs Abs: 2 10*3/uL (ref 0.7–4.0)
MCH: 27.1 pg (ref 26.0–34.0)
MCHC: 32.9 g/dL (ref 30.0–36.0)
MCV: 82.4 fL (ref 80.0–100.0)
Monocytes Absolute: 1 10*3/uL (ref 0.1–1.0)
Monocytes Relative: 6 %
Neutro Abs: 12 10*3/uL — ABNORMAL HIGH (ref 1.7–7.7)
Neutrophils Relative %: 77 %
Platelets: 545 10*3/uL — ABNORMAL HIGH (ref 150–400)
RBC: 5.06 MIL/uL (ref 3.87–5.11)
RDW: 16.7 % — ABNORMAL HIGH (ref 11.5–15.5)
WBC: 15.3 10*3/uL — ABNORMAL HIGH (ref 4.0–10.5)
nRBC: 0 % (ref 0.0–0.2)

## 2019-10-11 LAB — COMPREHENSIVE METABOLIC PANEL
ALT: 27 U/L (ref 0–44)
AST: 45 U/L — ABNORMAL HIGH (ref 15–41)
Albumin: 4 g/dL (ref 3.5–5.0)
Alkaline Phosphatase: 102 U/L (ref 38–126)
Anion gap: 16 — ABNORMAL HIGH (ref 5–15)
BUN: 23 mg/dL (ref 8–23)
CO2: 20 mmol/L — ABNORMAL LOW (ref 22–32)
Calcium: 8.9 mg/dL (ref 8.9–10.3)
Chloride: 102 mmol/L (ref 98–111)
Creatinine, Ser: 0.9 mg/dL (ref 0.44–1.00)
GFR calc Af Amer: >60 mL/min (ref >60)
GFR calc non Af Amer: >60 mL/min (ref >60)
Glucose, Bld: 114 mg/dL — ABNORMAL HIGH (ref 70–99)
Potassium: 3.2 mmol/L — ABNORMAL LOW (ref 3.5–5.1)
Sodium: 138 mmol/L (ref 135–145)
Total Bilirubin: 0.7 mg/dL (ref 0.3–1.2)
Total Protein: 7 g/dL (ref 6.5–8.1)

## 2019-10-11 LAB — URINALYSIS, ROUTINE W REFLEX MICROSCOPIC
Bilirubin Urine: NEGATIVE
Glucose, UA: NEGATIVE mg/dL
Hgb urine dipstick: NEGATIVE
Ketones, ur: NEGATIVE mg/dL
Nitrite: NEGATIVE
Protein, ur: 30 mg/dL — AB
Specific Gravity, Urine: 1.015 (ref 1.005–1.030)
pH: 6 (ref 5.0–8.0)

## 2019-10-11 LAB — ETHANOL: Alcohol, Ethyl (B): <10 mg/dL (ref <10)

## 2019-10-11 LAB — LIPASE, BLOOD: Lipase: 23 U/L (ref 11–51)

## 2019-10-11 MED ORDER — CEPHALEXIN 500 MG PO CAPS: 500.0000 mg | ORAL_CAPSULE | Freq: Four times a day (QID) | ORAL | 0 refills | Status: DC

## 2019-10-11 MED ORDER — SODIUM CHLORIDE 0.9 % IV SOLN: INTRAVENOUS | Status: DC

## 2019-10-11 MED ORDER — SODIUM CHLORIDE 0.9 % IV BOLUS
500.0000 mL | Freq: Once | INTRAVENOUS | Status: AC
  Administered 2019-10-11: 500 mL via INTRAVENOUS

## 2019-10-11 MED ORDER — TEMAZEPAM 15 MG PO CAPS: 15.0000 mg | ORAL_CAPSULE | Freq: Every day | ORAL | 0 refills | Status: DC

## 2019-10-11 MED ORDER — AMLODIPINE BESYLATE 5 MG PO TABS
10.0000 mg | ORAL_TABLET | Freq: Every day | ORAL | Status: DC
  Administered 2019-10-11: 10 mg via ORAL
  Filled 2019-10-11: qty 2

## 2019-10-11 MED ORDER — ONDANSETRON HCL 4 MG/2ML IJ SOLN
4.0000 mg | Freq: Once | INTRAMUSCULAR | Status: AC
  Administered 2019-10-11: 4 mg via INTRAVENOUS
  Filled 2019-10-11: qty 2

## 2019-10-11 MED ORDER — HYDROMORPHONE HCL 1 MG/ML IJ SOLN
0.5000 mg | Freq: Once | INTRAMUSCULAR | Status: AC
  Administered 2019-10-11: 0.5 mg via INTRAVENOUS
  Filled 2019-10-11: qty 1

## 2019-10-11 MED ORDER — SODIUM CHLORIDE 0.9 % IV SOLN
1.0000 g | Freq: Once | INTRAVENOUS | Status: AC
  Administered 2019-10-11: 1 g via INTRAVENOUS
  Filled 2019-10-11: qty 10

## 2019-10-11 MED ORDER — LORAZEPAM 1 MG PO TABS: 1.0000 mg | ORAL_TABLET | Freq: Two times a day (BID) | ORAL | 0 refills | Status: DC

## 2019-10-11 MED ORDER — LORAZEPAM 2 MG/ML IJ SOLN
1.0000 mg | Freq: Once | INTRAMUSCULAR | Status: AC
  Administered 2019-10-11: 1 mg via INTRAVENOUS
  Filled 2019-10-11: qty 1

## 2019-10-11 NOTE — ED Provider Notes (Signed)
[facility] EMERGENCY DEPARTMENT Provider Note   CSN: [id number] Arrival date & time: 10/11/19  [id number]     History Chief Complaint  Patient presents with  . Delirium Tremens (DTS)  . Dizziness    Stacy Dalton is a 69 y.o. female.  Patient brought in by EMS.  Patient reports that she ran out of her Restoril medication about a week ago and started getting dizziness and losing her balance with multiple falls but denies hurting anything with falls.  In the past few days.  No passing out.  No real room spinning is more of a lightheadedness situation.  Also a complaint of headache but not severe.  Patient has a history of anxiety.  Ever since her husband passed away several years ago.  Is followed by [facility].  Patient has tramadol at home for pain.  Patient has an appointment with a primary coming up on April 19.  There was an incidental finding of a tic on her right upper back area.  But do not think that this was necessarily playing a role with her symptoms.  This was removed.  Patient upon arrival here had a little bit of tremor which seem more anxiety a little bit of low-grade tachycardia.  Was alert everything moving fine no signs of any significant injury.  Patient denied any respiratory symptoms any chest pain any shortness of breath any abdominal pain.  Oxygen saturations on room air were 99%.  Past medical history is significant for hypertension depression COPD history of migraines history of panic attacks history of anxiety and history of chronic pain.  In addition patient denies any alcohol use.        Past Medical History:  Diagnosis Date  . Chronic pain   . COPD (chronic obstructive pulmonary disease) ([place])    told has copd, no current inhaler use  . Cough   . Depression   . GERD (gastroesophageal reflux disease)   . Hypertension   . Hypothyroidism   . Insomnia   . Migraine   . Migraine   . Osteopenia   . Pain management   . Panic attacks     . Small bowel obstruction [facility])     Patient Active Problem List   Diagnosis Date Noted  . Multifocal pneumonia 09/19/2018  . Large bowel obstruction ([place])   . Small bowel obstruction ([place]) 07/20/2018  . Hypomagnesemia 07/20/2018  . Cough 03/14/2018  . Fatigue 03/14/2018  . Polypharmacy 02/23/2017  . Hypokalemia 02/23/2017  . Toxic metabolic encephalopathy [id number]  . Chronic pain 02/22/2017  . Anxiety 02/22/2017  . GERD (gastroesophageal reflux disease) 02/22/2017  . Seizure ([place])   . Pain medication agreement signed 10/10/2016  . Opioid dependence ([place]) 10/10/2016  . Status post colostomy takedown 04/12/2016  . Chronic constipation 12/03/2015  . Peritonitis with abscess of intestine ([place]) 11/29/2015  . COPD exacerbation ([place]) 09/20/2015  . Osteopenia 06/10/2014  . Vitamin D deficiency disease 06/10/2014  . Hypothyroidism   . Depression 11/18/2012  . Insomnia 11/18/2012  . Chronic pain syndrome 11/18/2012  . HLD (hyperlipidemia) 09/01/2012  . S/P colostomy ([place]) 04/19/2012  . Normocytic anemia 10/15/2011  . Hypertension 10/13/2011  . Chronic back pain 10/13/2011  . COPD (chronic obstructive pulmonary disease) ([place]) 10/09/2011    Past Surgical History:  Procedure Laterality Date  . ABDOMINAL HYSTERECTOMY    . COLON SURGERY    . COLOSTOMY CLOSURE  04/19/2012   Procedure: COLOSTOMY CLOSURE;  Surgeon: Adin Hector, MD;  Location: [facility];  Service: General;  Laterality: N/A;  Laparotomy, Resection and Closure of Colostomy  . COLOSTOMY TAKEDOWN N/A 04/12/2016   Procedure: [place] TAKEDOWN;  Surgeon: Johnathan Hausen, MD;  Location: [facility];  Service: General;  Laterality: N/A;  . INCONTINENCE SURGERY    . LAPAROTOMY  10/04/2011, colostomy also   Procedure: EXPLORATORY LAPAROTOMY;  Surgeon: Adin Hector, MD;  Location: [facility];  Service: General;  Laterality: N/A;  left partial colectomy with colostomy  . LAPAROTOMY  04/19/2012   Procedure: EXPLORATORY LAPAROTOMY;   Surgeon: Adin Hector, MD;  Location: [facility];  Service: General;  Laterality: N/A;  . LAPAROTOMY N/A 11/29/2015   Procedure: EXPLORATORY LAPAROTOMY; SUBTOTAL COLECTOMY WITH HARTMAN PROCEDURE AND END COLOSTOMY;  Surgeon: Johnathan Hausen, MD;  Location: [facility];  Service: General;  Laterality: N/A;  . TUBAL LIGATION    . VENTRAL HERNIA REPAIR  04/19/2012   Procedure: HERNIA REPAIR VENTRAL ADULT;  Surgeon: Adin Hector, MD;  Location: [facility];  Service: General;  Laterality: N/A;     OB History    Gravida      Para      Term      Preterm      AB      Living  5     SAB      TAB      Ectopic      Multiple      Live Births              Family History  Problem Relation Age of Onset  . Heart disease Mother   . Hypertension Mother   . Cancer Sister        sinus  . Diabetes Brother   . Heart disease Brother   . Thyroid disease Brother   . Cancer Sister        abdominal ?  . Thyroid disease Sister   . Cancer Other        GE junction adenocarcinoma  . Emphysema Father   . Stroke Father   . Hypertension Father   . Heart disease Father   . COPD Sister   . Thyroid disease Sister   . Thyroid disease Sister   . Diabetes Brother   . Thyroid disease Brother   . Thyroid disease Brother   . Hypertension Brother   . Post-traumatic stress disorder Brother   . COPD Brother   . Thyroid disease Brother   . Thyroid disease Brother   . Hypertension Brother   . Transient ischemic attack Brother     Social History   Tobacco Use  . Smoking status: Former Smoker    Packs/day: 1.50    Years: 20.00    Pack years: 30.00    Types: Cigarettes    Quit date: 10/04/1998    Years since quitting: 21.0  . Smokeless tobacco: Never Used  Substance Use Topics  . Alcohol use: No  . Drug use: No    Home Medications Prior to Admission medications   Medication Sig Start Date End Date Taking? Authorizing Provider  albuterol (PROVENTIL) (2.5 MG/3ML) 0.083% nebulizer solution  Take 3 mLs (2.5 mg total) by nebulization every 6 (six) hours as needed for wheezing or shortness of breath. 07/25/19  Yes Hawks, Christy A, FNP  albuterol (VENTOLIN HFA) 108 (90 Base) MCG/ACT inhaler Inhale 2 puffs into the lungs every 6 (six) hours as needed for wheezing or shortness of breath.    Yes [provider]  amLODipine-olmesartan (AZOR) 10-40 MG tablet Take 1 tablet by mouth daily. 07/16/19  Yes Hawks, Christy A, FNP  Aspirin-Salicylamide-Caffeine (BC HEADACHE POWDER PO) Take 1 Dose by mouth daily as needed.   Yes [provider]  atorvastatin (LIPITOR) 20 MG tablet Take 1 tablet (20 mg total) by mouth daily. 07/18/19 07/17/20 Yes Hawks, Christy A, FNP  DULoxetine (CYMBALTA) 60 MG capsule Take 1 capsule (60 mg total) by mouth daily. 07/16/19  Yes Hawks, Christy A, FNP  Fluticasone Furoate-Vilanterol (BREO ELLIPTA IN) Inhale 1 puff into the lungs daily.   Yes [provider]  levothyroxine (SYNTHROID) 25 MCG tablet Take 1 tablet (25 mcg total) by mouth daily before breakfast. 07/16/19  Yes Hawks, Christy A, FNP  traMADol (ULTRAM) 50 MG tablet Take 2 tablets (100 mg total) by mouth every 8 (eight) hours as needed (pain). 07/16/19  Yes Hawks, Christy A, FNP  Vitamin D, Ergocalciferol, (DRISDOL) 1.25 MG (50000 UT) CAPS capsule TAKE 1 CAPSULE (50000 UNITS) BY MOUTH EVERY 7 DAYS 04/04/19  Yes Hawks, Christy A, FNP  cephALEXin (KEFLEX) 500 MG capsule Take 1 capsule (500 mg total) by mouth 4 (four) times daily. 10/11/19   Fredia Sorrow, MD  ciprofloxacin (CIPRO) 500 MG tablet Take 1 tablet (500 mg total) by mouth 2 (two) times daily. Patient not taking: Reported on 10/11/2019 08/23/19   Dettinger, Fransisca Kaufmann, MD  LORazepam (ATIVAN) 1 MG tablet Take 1 tablet (1 mg total) by mouth 2 (two) times daily. 10/11/19   Fredia Sorrow, MD  ondansetron (ZOFRAN) 4 MG tablet Take 1 tablet (4 mg total) by mouth every 8 (eight) hours as needed for nausea. Patient not taking: Reported on  10/11/2019 07/16/19   Evelina Dun A, FNP  rOPINIRole (REQUIP) 2 MG tablet Take 1 tablet (2 mg total) by mouth at bedtime. Patient not taking: Reported on 10/11/2019 07/21/19   Evelina Dun A, FNP  sulfamethoxazole-trimethoprim (BACTRIM DS) 800-160 MG tablet Take 1 tablet by mouth 2 (two) times daily. 09/27/19   [provider]  temazepam (RESTORIL) 15 MG capsule Take 1 capsule (15 mg total) by mouth at bedtime. 10/11/19   Fredia Sorrow, MD    Allergies    Toradol [ketorolac tromethamine], Butalbital-apap-caffeine, Demerol, Esgic [butalbital-apap-caffeine], and Iodine  Review of Systems   Review of Systems  Constitutional: Positive for fatigue. Negative for chills and fever.  HENT: Negative for congestion, rhinorrhea and sore throat.   Eyes: Negative for visual disturbance.  Respiratory: Negative for cough and shortness of breath.   Cardiovascular: Negative for chest pain and leg swelling.  Gastrointestinal: Negative for abdominal pain, diarrhea, nausea and vomiting.  Genitourinary: Negative for dysuria.  Musculoskeletal: Positive for back pain. Negative for neck pain.  Skin: Negative for rash.  Neurological: Positive for tremors, weakness, light-headedness and headaches. Negative for dizziness and speech difficulty.  Hematological: Does not bruise/bleed easily.  Psychiatric/Behavioral: Negative for confusion.    Physical Exam Updated Vital Signs BP (!) 153/85   Pulse 65   Temp 98.2 F (36.8 C) (Oral)   Resp 15   Ht 1.549 m (5\' 1" )   Wt 47.6 kg   SpO2 91%   BMI 19.84 kg/m   Physical Exam Vitals and nursing note reviewed.  Constitutional:      General: She is not in acute distress.    Appearance: Normal appearance. She is well-developed.  HENT:     Head: Normocephalic and atraumatic.  Eyes:     Extraocular Movements: Extraocular movements intact.  Conjunctiva/sclera: Conjunctivae normal.     Pupils: Pupils are equal, round, and reactive to light.    Cardiovascular:     Rate and Rhythm: Regular rhythm. Tachycardia present.     Heart sounds: No murmur.  Pulmonary:     Effort: Pulmonary effort is normal. No respiratory distress.     Breath sounds: Normal breath sounds.  Abdominal:     Palpations: Abdomen is soft.     Tenderness: There is no abdominal tenderness.  Musculoskeletal:        General: No swelling, tenderness, deformity or signs of injury. Normal range of motion.     Cervical back: Normal range of motion and neck supple.  Skin:    General: Skin is warm and dry.     Capillary Refill: Capillary refill takes less than 2 seconds.     Coloration: Skin is pale.  Neurological:     General: No focal deficit present.     Mental Status: She is alert and oriented to person, place, and time.     Cranial Nerves: No cranial nerve deficit.     Sensory: No sensory deficit.     Motor: No weakness.     Coordination: Coordination normal.     Comments: Patient does have some upper extremity tremors.     ED Results / Procedures / Treatments   Labs (all labs ordered are listed, but only abnormal results are displayed) Labs Reviewed  COMPREHENSIVE METABOLIC PANEL - Abnormal; Notable for the following components:      Result Value   Potassium 3.2 (*)    CO2 20 (*)    Glucose, Bld 114 (*)    AST 45 (*)    Anion gap 16 (*)    All other components within normal limits  CBC WITH DIFFERENTIAL/PLATELET - Abnormal; Notable for the following components:   WBC 15.3 (*)    RDW 16.7 (*)    Platelets 545 (*)    Neutro Abs 12.0 (*)    Abs Immature Granulocytes 0.10 (*)    All other components within normal limits  URINALYSIS, ROUTINE W REFLEX MICROSCOPIC - Abnormal; Notable for the following components:   Protein, ur 30 (*)    Leukocytes,Ua MODERATE (*)    Bacteria, UA RARE (*)    Non Squamous Epithelial 0-5 (*)    All other components within normal limits  URINE CULTURE  ETHANOL  LIPASE, BLOOD    EKG EKG  Interpretation  Date/Time:  Saturday October 11 2019 08:20:11 EDT Ventricular Rate:  101 PR Interval:    QRS Duration: 72 QT Interval:  332 QTC Calculation: 431 R Axis:   78 Text Interpretation: Sinus tachycardia No significant change since last tracing Confirmed by Fredia Sorrow [phone]) on 10/11/2019 9:38:09 AM   Radiology CT Head Wo Contrast  Result Date: 10/11/2019 CLINICAL DATA:  Ataxia, possible stroke. Multiple recent falls. Dizziness. EXAM: CT HEAD WITHOUT CONTRAST TECHNIQUE: Contiguous axial images were obtained from the base of the skull through the vertex without intravenous contrast. COMPARISON:  02/22/2017 FINDINGS: Brain: Ventricles, cisterns and other CSF spaces are normal. There is no mass, mass effect, shift of midline structures or acute hemorrhage. No evidence of acute infarction. Vascular: No hyperdense vessel or unexpected calcification. Skull: Normal. Negative for fracture or focal lesion. Sinuses/Orbits: No acute finding. Other: None. IMPRESSION: No acute findings. Electronically Signed   By: Marin Olp M.D.   On: 10/11/2019 09:16   DG Chest Port 1 View  Result Date: 10/11/2019 CLINICAL DATA:  Dizziness and weakness with multiple recent falls. EXAM: PORTABLE CHEST 1 VIEW COMPARISON:  09/30/2018 FINDINGS: Lungs are adequately inflated and otherwise clear. Cardiomediastinal silhouette and remainder of the exam is unchanged. IMPRESSION: No acute cardiopulmonary disease. Electronically Signed   By: Marin Olp M.D.   On: 10/11/2019 09:10    Procedures Procedures (including critical care time)  Medications Ordered in ED Medications  0.9 %  sodium chloride infusion ( Intravenous New Bag/Given 10/11/19 1021)  amLODipine (NORVASC) tablet 10 mg (10 mg Oral Given 10/11/19 1020)  cefTRIAXone (ROCEPHIN) 1 g in sodium chloride 0.9 % 100 mL IVPB (1 g Intravenous New Bag/Given 10/11/19 1123)  ondansetron (ZOFRAN) injection 4 mg (4 mg Intravenous Given 10/11/19 0902)   HYDROmorphone (DILAUDID) injection 0.5 mg (0.5 mg Intravenous Given 10/11/19 0903)  LORazepam (ATIVAN) injection 1 mg (1 mg Intravenous Given 10/11/19 0903)  sodium chloride 0.9 % bolus 500 mL (500 mLs Intravenous New Bag/Given 10/11/19 [id number])    ED Course  I have reviewed the triage vital signs and the nursing notes.  Pertinent labs & imaging results that were available during my care of the patient were reviewed by me and considered in my medical decision making (see chart for details).    MDM Rules/Calculators/A&P                      Work-up here lab wise head CT chest x-ray without any acute findings other than a leukocytosis with a white blood cell count of 15,000.  Patient without any fever here chest x-ray negative urinalysis looks like urinary tract infection.  Not sure if this is causing any of her symptoms.  Urine sent for culture.  A mild hypokalemia with a potassium of 3.2.  Patient treated here with Ativan and low-dose pain medication feeling much better.  Heart rate still around the 100 its sinus tachycardia.  Patient's blood pressure improved with amlodipine.  Patient's usual blood pressure medicine not available here to give.  But this was close to what she normally takes.  Think patient stable for discharge home and follow-up with primary care doctor.  Have renewed her Restoril have sent her home with short course of Ativan.  She has tramadol at home for pain.  She has follow-up on April 19 with her primary care doctor.    Final Clinical Impression(s) / ED Diagnoses Final diagnoses:  Lightheadedness  Weakness generalized  Anxiety  Acute cystitis without hematuria  Hypokalemia    Rx / DC Orders ED Discharge Orders         Ordered    cephALEXin (KEFLEX) 500 MG capsule  4 times daily     10/11/19 1134    LORazepam (ATIVAN) 1 MG tablet  2 times daily     10/11/19 1134    temazepam (RESTORIL) 15 MG capsule  Daily at bedtime     10/11/19 1134            Fredia Sorrow, MD 10/11/19 1145

## 2019-10-11 NOTE — ED Triage Notes (Addendum)
PT brought in by [facility] and reports she ran out of restoril medication about a week ago and started getting dizziness and losing her balance with multiple falls in the past 3 days so she also stopped talking her tramadol. PT c/o headache and back pain upon arrival and has visible tremors. PT states she has a PCP appt on 10/20/2019. Upon arrival and getting patient into a gown an embedded tick was noticed to patient's right upper back and was successfully removed and placed in clear bag.

## 2019-10-11 NOTE — Discharge Instructions (Addendum)
Take the antibiotic Keflex as directed for the urinary tract infection.  Urinalysis sent for culture.  Labs here today showed an elevation in your white blood cell count.  And slightly low potassium have your primary care doctor follow-up on both of these.  Have renewed your Resporal and gave you a short course of Ativan to take for the anxiety.  Take the tramadol that she have at home for pain.  Keep your appointment that she have upcoming with your primary care doctor.  Return for any new or worse symptoms.

## 2019-10-13 LAB — URINE CULTURE: Culture: <10000 — AB

## 2019-10-14 ENCOUNTER — Telehealth: Payer: Self-pay | Admitting: Family

## 2019-10-14 NOTE — Telephone Encounter (Signed)
Patient aware and verbalized understanding. °

## 2019-10-14 NOTE — Telephone Encounter (Signed)
Pt called to let Alyse Low know that when she was in the hospital she was prescribed the lowest dose of Restoril and says that's not what she normally takes. Pt says Alyse Low knows she doesn't need to be on the lowest dose and wants to know what Alyse Low wants her to do.

## 2019-10-14 NOTE — Telephone Encounter (Signed)
She was also given Ativan. We will have to continue current dose of Restoril at this time.

## 2019-10-20 ENCOUNTER — Ambulatory Visit: Payer: Medicare Other | Admitting: Family

## 2019-10-24 ENCOUNTER — Other Ambulatory Visit: Payer: Self-pay | Admitting: Family

## 2019-10-24 DIAGNOSIS — A084 Viral intestinal infection, unspecified: Secondary | ICD-10-CM

## 2019-10-24 MED ORDER — ONDANSETRON HCL 4 MG PO TABS: 4.0000 mg | ORAL_TABLET | Freq: Three times a day (TID) | ORAL | 3 refills | Status: DC | PRN

## 2019-10-24 NOTE — Telephone Encounter (Signed)
  Prescription Request  10/24/2019  What is the name of the medication or equipment? ondansetron (ZOFRAN) 4 MG tablet  Have you contacted your pharmacy to request a refill? (if applicable) YES  Which pharmacy would you like this sent to? [facility]   Patient notified that their request is being sent to the clinical staff for review and that they should receive a response within 2 business days.

## 2019-11-05 ENCOUNTER — Encounter: Payer: Self-pay | Admitting: Family

## 2019-11-05 ENCOUNTER — Other Ambulatory Visit: Payer: Self-pay

## 2019-11-05 ENCOUNTER — Ambulatory Visit (INDEPENDENT_AMBULATORY_CARE_PROVIDER_SITE_OTHER): Payer: Medicare Other | Admitting: Family

## 2019-11-05 VITALS — BP 149/75 | HR 105 | Temp 99.2°F | Ht 61.0 in | Wt 113.2 lb

## 2019-11-05 DIAGNOSIS — G894 Chronic pain syndrome: Secondary | ICD-10-CM

## 2019-11-05 DIAGNOSIS — Z0289 Encounter for other administrative examinations: Secondary | ICD-10-CM

## 2019-11-05 DIAGNOSIS — Z780 Asymptomatic menopausal state: Secondary | ICD-10-CM

## 2019-11-05 DIAGNOSIS — I10 Essential (primary) hypertension: Secondary | ICD-10-CM | POA: Diagnosis not present

## 2019-11-05 DIAGNOSIS — K5909 Other constipation: Secondary | ICD-10-CM

## 2019-11-05 DIAGNOSIS — G4700 Insomnia, unspecified: Secondary | ICD-10-CM | POA: Diagnosis not present

## 2019-11-05 DIAGNOSIS — J42 Unspecified chronic bronchitis: Secondary | ICD-10-CM

## 2019-11-05 DIAGNOSIS — K219 Gastro-esophageal reflux disease without esophagitis: Secondary | ICD-10-CM

## 2019-11-05 DIAGNOSIS — F419 Anxiety disorder, unspecified: Secondary | ICD-10-CM

## 2019-11-05 DIAGNOSIS — F321 Major depressive disorder, single episode, moderate: Secondary | ICD-10-CM | POA: Diagnosis not present

## 2019-11-05 DIAGNOSIS — G8929 Other chronic pain: Secondary | ICD-10-CM

## 2019-11-05 DIAGNOSIS — E039 Hypothyroidism, unspecified: Secondary | ICD-10-CM

## 2019-11-05 DIAGNOSIS — R059 Cough, unspecified: Secondary | ICD-10-CM

## 2019-11-05 DIAGNOSIS — M546 Pain in thoracic spine: Secondary | ICD-10-CM

## 2019-11-05 DIAGNOSIS — F1120 Opioid dependence, uncomplicated: Secondary | ICD-10-CM

## 2019-11-05 DIAGNOSIS — E785 Hyperlipidemia, unspecified: Secondary | ICD-10-CM

## 2019-11-05 DIAGNOSIS — R05 Cough: Secondary | ICD-10-CM

## 2019-11-05 MED ORDER — TRAMADOL HCL 50 MG PO TABS: 100.0000 mg | ORAL_TABLET | Freq: Three times a day (TID) | ORAL | 2 refills | Status: DC | PRN

## 2019-11-05 MED ORDER — DULOXETINE HCL 60 MG PO CPEP: 60.0000 mg | ORAL_CAPSULE | Freq: Every day | ORAL | 3 refills | Status: DC

## 2019-11-05 MED ORDER — LEVOTHYROXINE SODIUM 25 MCG PO TABS: 25.0000 ug | ORAL_TABLET | Freq: Every day | ORAL | 3 refills | Status: DC

## 2019-11-05 MED ORDER — TEMAZEPAM 15 MG PO CAPS: 15.0000 mg | ORAL_CAPSULE | Freq: Every day | ORAL | 2 refills | Status: DC

## 2019-11-05 NOTE — Progress Notes (Signed)
Subjective:    Patient ID: Stacy Dalton, female    DOB: 1950-10-03, 69 y.o.   MRN: [id number]  Chief Complaint  Patient presents with  . Medical Management of Chronic Issues   Ptcallsthe office today for chronic follow up. PT has pulmonologistsfor COPD and chronic cough, but states she has not seen them since COVID. Hypertension This is a chronic problem. The current episode started more than 1 year ago. The problem has been resolved since onset. The problem is controlled. Associated symptoms include anxiety and malaise/fatigue. Pertinent negatives include no peripheral edema or shortness of breath. Risk factors for coronary artery disease include dyslipidemia, diabetes mellitus and sedentary lifestyle. The current treatment provides moderate improvement. Identifiable causes of hypertension include a thyroid problem.  Gastroesophageal Reflux She complains of belching, coughing and heartburn. This is a chronic problem. The current episode started more than 1 year ago. The problem occurs occasionally. The problem has been waxing and waning. Associated symptoms include fatigue. She has tried a PPI for the symptoms. The treatment provided moderate relief.  Constipation This is a chronic problem. The current episode started more than 1 year ago. Her stool frequency is 1 time per day. Associated symptoms include diarrhea. The treatment provided moderate relief.  Thyroid Problem Presents for follow-up visit. Symptoms include anxiety, constipation, depressed mood, diarrhea and fatigue. Patient reports no diaphoresis. The symptoms have been stable. Her past medical history is significant for hyperlipidemia.  Hyperlipidemia This is a chronic problem. The current episode started more than 1 year ago. The problem is controlled. Recent lipid tests were reviewed and are high. Pertinent negatives include no shortness of breath. Current antihyperlipidemic treatment includes statins. The current treatment  provides moderate improvement of lipids. Risk factors for coronary artery disease include dyslipidemia, hypertension and post-menopausal.  Depression        This is a chronic problem.  The current episode started more than 1 year ago.   The onset quality is gradual.   The problem occurs intermittently.  Associated symptoms include fatigue, insomnia, irritable, restlessness and sad.  Associated symptoms include no hopelessness.  Past medical history includes thyroid problem and anxiety.   Anxiety Presents for follow-up visit. Symptoms include depressed mood, excessive worry, insomnia, irritability, nervous/anxious behavior and restlessness. Patient reports no shortness of breath. The severity of symptoms is moderate. The quality of sleep is good.    COPD Pt taking Breo daily. SOB stable.     Review of Systems  Constitutional: Positive for fatigue, irritability and malaise/fatigue. Negative for diaphoresis.  Respiratory: Positive for cough. Negative for shortness of breath.   Gastrointestinal: Positive for constipation, diarrhea and heartburn.  Psychiatric/Behavioral: Positive for depression. The patient is nervous/anxious and has insomnia.        Objective:   Physical Exam Vitals reviewed.  Constitutional:      General: She is irritable. She is not in acute distress.    Appearance: She is well-developed.  HENT:     Head: Normocephalic and atraumatic.     Right Ear: Tympanic membrane normal.     Left Ear: Tympanic membrane normal.  Eyes:     Pupils: Pupils are equal, round, and reactive to light.  Neck:     Thyroid: No thyromegaly.  Cardiovascular:     Rate and Rhythm: Normal rate and regular rhythm.     Heart sounds: Normal heart sounds. No murmur.  Pulmonary:     Effort: Pulmonary effort is normal. No respiratory distress.     Breath  sounds: Decreased breath sounds present. No wheezing.  Abdominal:     General: Bowel sounds are normal. There is no distension.     Palpations:  Abdomen is soft.     Tenderness: There is no abdominal tenderness.  Musculoskeletal:        General: No tenderness. Normal range of motion.     Cervical back: Normal range of motion and neck supple.  Skin:    General: Skin is warm and dry.  Neurological:     Mental Status: She is alert and oriented to person, place, and time.     Cranial Nerves: No cranial nerve deficit.     Deep Tendon Reflexes: Reflexes are normal and symmetric.  Psychiatric:        Behavior: Behavior normal.        Thought Content: Thought content normal.        Judgment: Judgment normal.       BP (!) 149/75   Pulse (!) 105   Temp 99.2 F (37.3 C) (Temporal)   Ht _0  (1.549 m)   Wt 113 lb 3.2 oz (51.3 kg)   SpO2 99%   BMI 21.39 kg/m      Assessment & Plan:  ROZINA POINTER comes in today with chief complaint of Medical Management of Chronic Issues   Diagnosis and orders addressed:  1. Anxiety - DULoxetine (CYMBALTA) 60 MG capsule; Take 1 capsule (60 mg total) by mouth daily.  Dispense: 90 capsule; Refill: 3 - CMP14+EGFR - CBC with Differential/Platelet  2. Current moderate episode of major depressive disorder, unspecified whether recurrent (HCC) - DULoxetine (CYMBALTA) 60 MG capsule; Take 1 capsule (60 mg total) by mouth daily.  Dispense: 90 capsule; Refill: 3 - CMP14+EGFR - CBC with Differential/Platelet  3. Insomnia, unspecified type - DULoxetine (CYMBALTA) 60 MG capsule; Take 1 capsule (60 mg total) by mouth daily.  Dispense: 90 capsule; Refill: 3 - CMP14+EGFR - CBC with Differential/Platelet  4. Pain medication agreement signed - traMADol (ULTRAM) 50 MG tablet; Take 2 tablets (100 mg total) by mouth every 8 (eight) hours as needed (pain).  Dispense: 180 tablet; Refill: 2 - CMP14+EGFR - CBC with Differential/Platelet - ToxASSURE Select 13 (MW), Urine  5. Uncomplicated opioid dependence (HCC) - traMADol (ULTRAM) 50 MG tablet; Take 2 tablets (100 mg total) by mouth every 8 (eight)  hours as needed (pain).  Dispense: 180 tablet; Refill: 2 - CMP14+EGFR - CBC with Differential/Platelet - ToxASSURE Select 13 (MW), Urine  6. Chronic bilateral thoracic back pain - traMADol (ULTRAM) 50 MG tablet; Take 2 tablets (100 mg total) by mouth every 8 (eight) hours as needed (pain).  Dispense: 180 tablet; Refill: 2 - CMP14+EGFR - CBC with Differential/Platelet  7. Chronic pain syndrome - traMADol (ULTRAM) 50 MG tablet; Take 2 tablets (100 mg total) by mouth every 8 (eight) hours as needed (pain).  Dispense: 180 tablet; Refill: 2 - CMP14+EGFR - CBC with Differential/Platelet  8. Hypothyroidism, unspecified type - levothyroxine (SYNTHROID) 25 MCG tablet; Take 1 tablet (25 mcg total) by mouth daily before breakfast.  Dispense: 90 tablet; Refill: 3 - CMP14+EGFR - CBC with Differential/Platelet - TSH  9. Essential hypertension - CMP14+EGFR - CBC with Differential/Platelet  10. Chronic bronchitis, unspecified chronic bronchitis type ([place]) - CMP14+EGFR - CBC with Differential/Platelet  11. Gastroesophageal reflux disease without esophagitis - CMP14+EGFR - CBC with Differential/Platelet  12. Hyperlipidemia, unspecified hyperlipidemia type - CMP14+EGFR - CBC with Differential/Platelet  13. Chronic constipation - CMP14+EGFR - CBC with Differential/Platelet  14. Cough - CMP14+EGFR - CBC with Differential/Platelet  15. Post-menopausal - DG [facility] DEXA - CMP14+EGFR - CBC with Differential/Platelet   Labs pending Pt reviewed in [place] controlled database, she has received controlled medications from ED. Contract and drug screen up dated today.  Health Maintenance reviewed Diet and exercise encouraged  Follow up plan: 3 months    Evelina Dun, FNP

## 2019-11-05 NOTE — Patient Instructions (Signed)

## 2019-11-06 ENCOUNTER — Telehealth: Payer: Self-pay | Admitting: Family

## 2019-11-06 LAB — CMP14+EGFR
ALT: 12 IU/L (ref 0–32)
AST: 25 IU/L (ref 0–40)
Albumin/Globulin Ratio: 2.1 (ref 1.2–2.2)
Albumin: 4.7 g/dL (ref 3.7–4.7)
Alkaline Phosphatase: 118 IU/L — ABNORMAL HIGH (ref 39–117)
BUN/Creatinine Ratio: 16 (ref 12–28)
BUN: 14 mg/dL (ref 8–27)
Bilirubin Total: <0.2 mg/dL (ref 0.0–1.2)
CO2: 23 mmol/L (ref 20–29)
Calcium: 9.5 mg/dL (ref 8.7–10.3)
Chloride: 99 mmol/L (ref 96–106)
Creatinine, Ser: 0.89 mg/dL (ref 0.57–1.00)
GFR calc Af Amer: 75 mL/min/{1.73_m2} (ref >59)
GFR calc non Af Amer: 65 mL/min/{1.73_m2} (ref >59)
Globulin, Total: 2.2 g/dL (ref 1.5–4.5)
Glucose: 101 mg/dL — ABNORMAL HIGH (ref 65–99)
Potassium: 4.4 mmol/L (ref 3.5–5.2)
Sodium: 140 mmol/L (ref 134–144)
Total Protein: 6.9 g/dL (ref 6.0–8.5)

## 2019-11-06 LAB — CBC WITH DIFFERENTIAL/PLATELET
Basophils Absolute: 0.1 10*3/uL (ref 0.0–0.2)
Basos: 1 %
EOS (ABSOLUTE): 0.2 10*3/uL (ref 0.0–0.4)
Eos: 2 %
Hematocrit: 40.2 % (ref 34.0–46.6)
Hemoglobin: 12.9 g/dL (ref 11.1–15.9)
Immature Grans (Abs): 0.1 10*3/uL (ref 0.0–0.1)
Immature Granulocytes: 1 %
Lymphocytes Absolute: 1.4 10*3/uL (ref 0.7–3.1)
Lymphs: 18 %
MCH: 26.8 pg (ref 26.6–33.0)
MCHC: 32.1 g/dL (ref 31.5–35.7)
MCV: 84 fL (ref 79–97)
Monocytes Absolute: 1.5 10*3/uL — ABNORMAL HIGH (ref 0.1–0.9)
Monocytes: 19 %
Neutrophils Absolute: 4.9 10*3/uL (ref 1.4–7.0)
Neutrophils: 59 %
Platelets: 399 10*3/uL (ref 150–450)
RBC: 4.81 x10E6/uL (ref 3.77–5.28)
RDW: 15.8 % — ABNORMAL HIGH (ref 11.7–15.4)
WBC: 8 10*3/uL (ref 3.4–10.8)

## 2019-11-06 LAB — TSH: TSH: 2.62 u[IU]/mL (ref 0.450–4.500)

## 2019-11-06 NOTE — Telephone Encounter (Signed)
She has been on 30 mg. Are we tapering back?

## 2019-11-07 LAB — TOXASSURE SELECT 13 (MW), URINE

## 2019-11-07 NOTE — Telephone Encounter (Signed)
Aware of lab results  

## 2019-11-10 NOTE — Telephone Encounter (Signed)
Patient aware and verbalizes understanding. 

## 2019-11-10 NOTE — Addendum Note (Signed)
Addended by: Evelina Dun A on: 11/10/2019 10:02 AM   Modules accepted: Orders

## 2019-11-10 NOTE — Telephone Encounter (Signed)
We will keep Restoril at 15 mg.

## 2019-12-12 ENCOUNTER — Emergency Department (HOSPITAL_COMMUNITY): Payer: Medicare Other

## 2019-12-12 ENCOUNTER — Emergency Department (HOSPITAL_COMMUNITY)
Admission: EM | Admit: 2019-12-12 | Discharge: 2019-12-12 | Disposition: A | Discharge status: Home / Self Care | Payer: Medicare Other | Attending: Emergency Medicine | Admitting: Emergency Medicine

## 2019-12-12 ENCOUNTER — Encounter (HOSPITAL_COMMUNITY): Payer: Self-pay | Admitting: *Deleted

## 2019-12-12 ENCOUNTER — Other Ambulatory Visit: Payer: Self-pay

## 2019-12-12 DIAGNOSIS — R4182 Altered mental status, unspecified: Secondary | ICD-10-CM | POA: Diagnosis not present

## 2019-12-12 DIAGNOSIS — I10 Essential (primary) hypertension: Secondary | ICD-10-CM | POA: Insufficient documentation

## 2019-12-12 DIAGNOSIS — Z79899 Other long term (current) drug therapy: Secondary | ICD-10-CM | POA: Insufficient documentation

## 2019-12-12 DIAGNOSIS — Z87891 Personal history of nicotine dependence: Secondary | ICD-10-CM | POA: Insufficient documentation

## 2019-12-12 DIAGNOSIS — J90 Pleural effusion, not elsewhere classified: Secondary | ICD-10-CM | POA: Diagnosis not present

## 2019-12-12 DIAGNOSIS — J449 Chronic obstructive pulmonary disease, unspecified: Secondary | ICD-10-CM | POA: Diagnosis not present

## 2019-12-12 DIAGNOSIS — R519 Headache, unspecified: Secondary | ICD-10-CM | POA: Diagnosis not present

## 2019-12-12 LAB — COMPREHENSIVE METABOLIC PANEL
ALT: 20 U/L (ref 0–44)
AST: 40 U/L (ref 15–41)
Albumin: 4.3 g/dL (ref 3.5–5.0)
Alkaline Phosphatase: 88 U/L (ref 38–126)
Anion gap: 14 (ref 5–15)
BUN: 22 mg/dL (ref 8–23)
CO2: 24 mmol/L (ref 22–32)
Calcium: 8.8 mg/dL — ABNORMAL LOW (ref 8.9–10.3)
Chloride: 102 mmol/L (ref 98–111)
Creatinine, Ser: 0.72 mg/dL (ref 0.44–1.00)
GFR calc Af Amer: >60 mL/min (ref >60)
GFR calc non Af Amer: >60 mL/min (ref >60)
Glucose, Bld: 90 mg/dL (ref 70–99)
Potassium: 4.1 mmol/L (ref 3.5–5.1)
Sodium: 140 mmol/L (ref 135–145)
Total Bilirubin: 0.5 mg/dL (ref 0.3–1.2)
Total Protein: 7.6 g/dL (ref 6.5–8.1)

## 2019-12-12 LAB — CBC WITH DIFFERENTIAL/PLATELET
Abs Immature Granulocytes: 0.07 10*3/uL (ref 0.00–0.07)
Basophils Absolute: 0.1 10*3/uL (ref 0.0–0.1)
Basophils Relative: 1 %
Eosinophils Absolute: 0.1 10*3/uL (ref 0.0–0.5)
Eosinophils Relative: 2 %
HCT: 37.5 % (ref 36.0–46.0)
Hemoglobin: 12.4 g/dL (ref 12.0–15.0)
Immature Granulocytes: 1 %
Lymphocytes Relative: 30 %
Lymphs Abs: 2.2 10*3/uL (ref 0.7–4.0)
MCH: 26.4 pg (ref 26.0–34.0)
MCHC: 33.1 g/dL (ref 30.0–36.0)
MCV: 79.8 fL — ABNORMAL LOW (ref 80.0–100.0)
Monocytes Absolute: 0.6 10*3/uL (ref 0.1–1.0)
Monocytes Relative: 8 %
Neutro Abs: 4.2 10*3/uL (ref 1.7–7.7)
Neutrophils Relative %: 58 %
Platelets: 388 10*3/uL (ref 150–400)
RBC: 4.7 MIL/uL (ref 3.87–5.11)
RDW: 15 % (ref 11.5–15.5)
WBC: 7.2 10*3/uL (ref 4.0–10.5)
nRBC: 0 % (ref 0.0–0.2)

## 2019-12-12 LAB — URINALYSIS, ROUTINE W REFLEX MICROSCOPIC
Bilirubin Urine: NEGATIVE
Glucose, UA: NEGATIVE mg/dL
Hgb urine dipstick: NEGATIVE
Ketones, ur: NEGATIVE mg/dL
Nitrite: NEGATIVE
Protein, ur: NEGATIVE mg/dL
Specific Gravity, Urine: 1.005 (ref 1.005–1.030)
pH: 7 (ref 5.0–8.0)

## 2019-12-12 LAB — LIPASE, BLOOD: Lipase: 24 U/L (ref 11–51)

## 2019-12-12 MED ORDER — ONDANSETRON 4 MG PO TBDP
4.0000 mg | ORAL_TABLET | Freq: Once | ORAL | Status: AC
  Administered 2019-12-12: 4 mg via ORAL
  Filled 2019-12-12: qty 1

## 2019-12-12 MED ORDER — TRAMADOL HCL 50 MG PO TABS
50.0000 mg | ORAL_TABLET | Freq: Once | ORAL | Status: AC
  Administered 2019-12-12: 50 mg via ORAL
  Filled 2019-12-12: qty 1

## 2019-12-12 NOTE — ED Triage Notes (Signed)
Patient presents to the ED after a call by her daughter for altered mental status.  EMS reports that family feels this patient has been out of "anxiety meds" but does not know what she is taking and for how long she has been off of them.  Patient states she does not take "anxiety meds".

## 2019-12-12 NOTE — ED Notes (Signed)
Pt in bed, pt changed into red scrubs, pt denies si or hi, pt reports some back pain, pt calm and cooperative, pt states that she has been a little sad since her husband died.  Pt knows that she is in a hospital and knows that it is June 2021, pt doesn't know day of the week.  Reoriented pt.  Pt moving all extremities, resps even and unlabored

## 2019-12-12 NOTE — ED Provider Notes (Signed)
[facility] EMERGENCY DEPARTMENT Provider Note   CSN: [id number] Arrival date & time: 12/12/19  1524     History Chief Complaint  Patient presents with  . Altered Mental Status    Stacy Dalton is a 69 y.o. female.  Patient brought in by EMS.  Patient presents to the emergency department after called by her daughter for altered mental status.  Patient thinks she may have had a panic attack.  Family stated they felt that patient was out of her anxiety meds.  The patient states she does not take any anxiety meds.  Patient without any specific complaints here.  Patient does not appear to have any significant altered mental status.        Past Medical History:  Diagnosis Date  . Chronic pain   . COPD (chronic obstructive pulmonary disease) ([place])    told has copd, no current inhaler use  . Cough   . Depression   . GERD (gastroesophageal reflux disease)   . Hypertension   . Hypothyroidism   . Insomnia   . Migraine   . Migraine   . Osteopenia   . Pain management   . Panic attacks   . Small bowel obstruction [facility])     Patient Active Problem List   Diagnosis Date Noted  . Multifocal pneumonia 09/19/2018  . Large bowel obstruction ([place])   . Small bowel obstruction ([place]) 07/20/2018  . Hypomagnesemia 07/20/2018  . Cough 03/14/2018  . Fatigue 03/14/2018  . Polypharmacy 02/23/2017  . Hypokalemia 02/23/2017  . Toxic metabolic encephalopathy 14/97/0263  . Anxiety 02/22/2017  . GERD (gastroesophageal reflux disease) 02/22/2017  . Seizure ([place])   . Pain medication agreement signed 10/10/2016  . Opioid dependence ([place]) 10/10/2016  . Status post colostomy takedown 04/12/2016  . Chronic constipation 12/03/2015  . Peritonitis with abscess of intestine ([place]) 11/29/2015  . COPD exacerbation ([place]) 09/20/2015  . Osteopenia 06/10/2014  . Vitamin D deficiency disease 06/10/2014  . Hypothyroidism   . Depression 11/18/2012  . Insomnia 11/18/2012  . Chronic pain syndrome  11/18/2012  . HLD (hyperlipidemia) 09/01/2012  . S/P colostomy ([place]) 04/19/2012  . Normocytic anemia 10/15/2011  . Hypertension 10/13/2011  . Chronic back pain 10/13/2011  . COPD (chronic obstructive pulmonary disease) ([place]) 10/09/2011    Past Surgical History:  Procedure Laterality Date  . ABDOMINAL HYSTERECTOMY    . COLON SURGERY    . COLOSTOMY CLOSURE  04/19/2012   Procedure: COLOSTOMY CLOSURE;  Surgeon: Adin Hector, MD;  Location: [facility] ORS;  Service: General;  Laterality: N/A;  Laparotomy, Resection and Closure of Colostomy  . COLOSTOMY TAKEDOWN N/A 04/12/2016   Procedure: [place] TAKEDOWN;  Surgeon: Johnathan Hausen, MD;  Location: [facility];  Service: General;  Laterality: N/A;  . INCONTINENCE SURGERY    . LAPAROTOMY  10/04/2011, colostomy also   Procedure: EXPLORATORY LAPAROTOMY;  Surgeon: Adin Hector, MD;  Location: [facility];  Service: General;  Laterality: N/A;  left partial colectomy with colostomy  . LAPAROTOMY  04/19/2012   Procedure: EXPLORATORY LAPAROTOMY;  Surgeon: Adin Hector, MD;  Location: [facility];  Service: General;  Laterality: N/A;  . LAPAROTOMY N/A 11/29/2015   Procedure: EXPLORATORY LAPAROTOMY; SUBTOTAL COLECTOMY WITH HARTMAN PROCEDURE AND END COLOSTOMY;  Surgeon: Johnathan Hausen, MD;  Location: [facility];  Service: General;  Laterality: N/A;  . TUBAL LIGATION    . VENTRAL HERNIA REPAIR  04/19/2012   Procedure: HERNIA REPAIR VENTRAL ADULT;  Surgeon: Adin Hector, MD;  Location: [facility];  Service: General;  Laterality: N/A;     OB History    Gravida      Para      Term      Preterm      AB      Living  5     SAB      TAB      Ectopic      Multiple      Live Births              Family History  Problem Relation Age of Onset  . Heart disease Mother   . Hypertension Mother   . Cancer Sister        sinus  . Diabetes Brother   . Heart disease Brother   . Thyroid disease Brother   . Cancer Sister        abdominal ?  . Thyroid  disease Sister   . Cancer Other        GE junction adenocarcinoma  . Emphysema Father   . Stroke Father   . Hypertension Father   . Heart disease Father   . COPD Sister   . Thyroid disease Sister   . Thyroid disease Sister   . Diabetes Brother   . Thyroid disease Brother   . Thyroid disease Brother   . Hypertension Brother   . Post-traumatic stress disorder Brother   . COPD Brother   . Thyroid disease Brother   . Thyroid disease Brother   . Hypertension Brother   . Transient ischemic attack Brother     Social History   Tobacco Use  . Smoking status: Former Smoker    Packs/day: 1.50    Years: 20.00    Pack years: 30.00    Types: Cigarettes    Quit date: 10/04/1998    Years since quitting: 21.2  . Smokeless tobacco: Never Used  Vaping Use  . Vaping Use: Never used  Substance Use Topics  . Alcohol use: No  . Drug use: No    Home Medications Prior to Admission medications   Medication Sig Start Date End Date Taking? Authorizing Provider  albuterol (PROVENTIL) (2.5 MG/3ML) 0.083% nebulizer solution Take 3 mLs (2.5 mg total) by nebulization every 6 (six) hours as needed for wheezing or shortness of breath. 07/25/19  Yes Hawks, Christy A, FNP  albuterol (VENTOLIN HFA) 108 (90 Base) MCG/ACT inhaler Inhale 2 puffs into the lungs every 6 (six) hours as needed for wheezing or shortness of breath.    Yes [provider]  amLODipine-olmesartan (AZOR) 10-40 MG tablet Take 1 tablet by mouth daily. 07/16/19  Yes Hawks, Christy A, FNP  DULoxetine (CYMBALTA) 60 MG capsule Take 1 capsule (60 mg total) by mouth daily. 11/05/19  Yes Hawks, Christy A, FNP  levothyroxine (SYNTHROID) 25 MCG tablet Take 1 tablet (25 mcg total) by mouth daily before breakfast. 11/05/19  Yes Hawks, Christy A, FNP  ondansetron (ZOFRAN) 4 MG tablet Take 1 tablet (4 mg total) by mouth every 8 (eight) hours as needed for nausea. 10/24/19  Yes Hawks, Christy A, FNP  temazepam (RESTORIL) 15 MG capsule Take 1 capsule  (15 mg total) by mouth at bedtime. 11/05/19  Yes Hawks, Christy A, FNP  traMADol (ULTRAM) 50 MG tablet Take 2 tablets (100 mg total) by mouth every 8 (eight) hours as needed (pain). 11/05/19  Yes Hawks, Christy A, FNP  Aspirin-Salicylamide-Caffeine (BC HEADACHE POWDER PO) Take 1 Dose by mouth daily as needed.    [provider]  atorvastatin (LIPITOR) 20 MG tablet Take 1 tablet (20 mg total) by mouth daily. Patient not taking: Reported on 11/05/2019 07/18/19 07/17/20  [name], FNP  Fluticasone Furoate-Vilanterol (BREO ELLIPTA IN) Inhale 1 puff into the lungs daily.    [provider]  rOPINIRole (REQUIP) 2 MG tablet Take 1 tablet (2 mg total) by mouth at bedtime. Patient not taking: Reported on 11/05/2019 07/21/19   [name], FNP  Vitamin D, Ergocalciferol, (DRISDOL) 1.25 MG (50000 UT) CAPS capsule TAKE 1 CAPSULE (50000 UNITS) BY MOUTH EVERY 7 DAYS Patient taking differently: Take 50,000 Units by mouth every 7 (seven) days. TAKE 1 CAPSULE (50000 UNITS) BY MOUTH EVERY 7 DAYS 04/04/19   [name], FNP    Allergies    Toradol [ketorolac tromethamine], Butalbital-apap-caffeine, Demerol, Esgic [butalbital-apap-caffeine], and Iodine  Review of Systems   Review of Systems  Constitutional: Negative for chills and fever.  HENT: Negative for congestion, rhinorrhea and sore throat.   Eyes: Negative for visual disturbance.  Respiratory: Negative for cough and shortness of breath.   Cardiovascular: Negative for chest pain and leg swelling.  Gastrointestinal: Negative for abdominal pain, diarrhea, nausea and vomiting.  Genitourinary: Negative for dysuria.  Musculoskeletal: Negative for back pain and neck pain.  Skin: Negative for rash.  Neurological: Negative for dizziness, seizures, facial asymmetry, speech difficulty, weakness, light-headedness, numbness and headaches.  Hematological: Does not bruise/bleed easily.  Psychiatric/Behavioral: Negative for confusion,  dysphoric mood and suicidal ideas.    Physical Exam Updated Vital Signs BP (!) 163/84 (BP Location: Left Arm)   Pulse 91   Resp 18   Ht 1.549 m (5\' 1" )   Wt 51.3 kg   SpO2 100%   BMI 21.35 kg/m   Physical Exam Vitals and nursing note reviewed.  Constitutional:      General: She is not in acute distress.    Appearance: Normal appearance. She is well-developed.  HENT:     Head: Normocephalic and atraumatic.  Eyes:     Extraocular Movements: Extraocular movements intact.     Conjunctiva/sclera: Conjunctivae normal.     Pupils: Pupils are equal, round, and reactive to light.  Cardiovascular:     Rate and Rhythm: Normal rate and regular rhythm.     Heart sounds: No murmur heard.   Pulmonary:     Effort: Pulmonary effort is normal. No respiratory distress.     Breath sounds: Normal breath sounds.  Abdominal:     Palpations: Abdomen is soft.     Tenderness: There is no abdominal tenderness.  Musculoskeletal:     Cervical back: Normal range of motion and neck supple.  Skin:    General: Skin is warm and dry.     Capillary Refill: Capillary refill takes less than 2 seconds.  Neurological:     General: No focal deficit present.     Mental Status: She is alert and oriented to person, place, and time.     Cranial Nerves: No cranial nerve deficit.     Sensory: No sensory deficit.     Motor: No weakness.     ED Results / Procedures / Treatments   Labs (all labs ordered are listed, but only abnormal results are displayed) Labs Reviewed  CBC WITH DIFFERENTIAL/PLATELET - Abnormal; Notable for the following components:      Result Value   MCV 79.8 (*)    All other components within normal limits  COMPREHENSIVE METABOLIC PANEL - Abnormal; Notable for the following components:  Calcium 8.8 (*)    All other components within normal limits  URINALYSIS, ROUTINE W REFLEX MICROSCOPIC - Abnormal; Notable for the following components:   Color, Urine STRAW (*)    Leukocytes,Ua  MODERATE (*)    Bacteria, UA RARE (*)    All other components within normal limits  LIPASE, BLOOD    EKG EKG Interpretation  Date/Time:  Friday December 12 2019 16:51:16 EDT Ventricular Rate:  78 PR Interval:    QRS Duration: 82 QT Interval:  395 QTC Calculation: 450 R Axis:   91 Text Interpretation: Sinus rhythm Right axis deviation No significant change since last tracing Confirmed by Fredia Sorrow [phone]) on 12/12/2019 5:09:56 PM   Radiology CT Head Wo Contrast  Result Date: 12/12/2019 CLINICAL DATA:  Mental status change, unknown cause. EXAM: CT HEAD WITHOUT CONTRAST TECHNIQUE: Contiguous axial images were obtained from the base of the skull through the vertex without intravenous contrast. COMPARISON:  Head CT 10/11/2019, brain MRI/MRA 02/22/2017 FINDINGS: Brain: Cerebral volume is normal. There is no acute intracranial hemorrhage. No demarcated cortical infarct. No extra-axial fluid collection. No evidence of intracranial mass. No midline shift. Vascular: No hyperdense vessel. Skull: Normal. Negative for fracture or focal lesion. Sinuses/Orbits: Visualized orbits show no acute finding. No significant paranasal sinus disease or mastoid effusion at the imaged levels. IMPRESSION: Unremarkable non-contrast CT appearance of the brain. No evidence of acute intracranial abnormality. Electronically Signed   By: Kellie Simmering DO   On: 12/12/2019 18:07   DG Chest Port 1 View  Result Date: 12/12/2019 CLINICAL DATA:  Altered mental status EXAM: PORTABLE CHEST 1 VIEW COMPARISON:  10/11/2019 FINDINGS: Cardiac shadow is mildly prominent but stable. Aortic calcifications are again seen. The lungs are hyperinflated consistent with COPD. No focal infiltrate or sizable effusion is noted. No bony abnormality is seen. IMPRESSION: COPD without acute abnormality. Electronically Signed   By: Inez Catalina M.D.   On: 12/12/2019 17:16    Procedures Procedures (including critical care time)  Medications  Ordered in ED Medications  traMADol (ULTRAM) tablet 50 mg (50 mg Oral Given 12/12/19 1718)  traMADol (ULTRAM) tablet 50 mg (50 mg Oral Given 12/12/19 2140)  ondansetron (ZOFRAN-ODT) disintegrating tablet 4 mg (4 mg Oral Given 12/12/19 2140)    ED Course  I have reviewed the triage vital signs and the nursing notes.  Pertinent labs & imaging results that were available during my care of the patient were reviewed by me and considered in my medical decision making (see chart for details).    MDM Rules/Calculators/A&P                          I had seen patient in the past.  And she required admission in April for electrolyte abnormalities.  Today's work-up without any significant findings.  Labs are normal.  Head CT negative chest x-ray without acute findings.  EKG without acute findings.  Urinalysis had moderate leukocytosis.  But the had no significant elevation in white blood cells or red blood cells.  And was nitrite negative.  Patient wants to go home.  Family members are willing to take her home.  She will return for any new or worse symptoms she will follow-up with her doctor. Final Clinical Impression(s) / ED Diagnoses Final diagnoses:  Altered mental status, unspecified altered mental status type    Rx / DC Orders ED Discharge Orders    None       Fredia Sorrow, MD  12/12/19 2152  

## 2019-12-12 NOTE — Discharge Instructions (Signed)
Work-up here today without any significant findings.  Return for any new or worse symptoms.  Make an appointment to follow-up with your doctors.

## 2019-12-12 NOTE — ED Notes (Signed)
Pt states that she is ready to go home, pt states that she has a friend coming to get her, pt ambulatory to bathroom without assistance, pt changed, pt ambulatory from dpt

## 2019-12-12 NOTE — ED Notes (Signed)
Pt in bed, pt requests pain and nausea med, md notified, meds given

## 2019-12-31 DIAGNOSIS — R404 Transient alteration of awareness: Secondary | ICD-10-CM | POA: Diagnosis not present

## 2019-12-31 DIAGNOSIS — R457 State of emotional shock and stress, unspecified: Secondary | ICD-10-CM | POA: Diagnosis not present

## 2020-01-14 ENCOUNTER — Ambulatory Visit: Payer: Medicare Other

## 2020-01-14 DIAGNOSIS — F419 Anxiety disorder, unspecified: Secondary | ICD-10-CM

## 2020-01-14 DIAGNOSIS — E785 Hyperlipidemia, unspecified: Secondary | ICD-10-CM

## 2020-01-14 DIAGNOSIS — E039 Hypothyroidism, unspecified: Secondary | ICD-10-CM

## 2020-01-14 DIAGNOSIS — G4700 Insomnia, unspecified: Secondary | ICD-10-CM

## 2020-01-14 DIAGNOSIS — J441 Chronic obstructive pulmonary disease with (acute) exacerbation: Secondary | ICD-10-CM

## 2020-01-14 DIAGNOSIS — K219 Gastro-esophageal reflux disease without esophagitis: Secondary | ICD-10-CM

## 2020-01-14 DIAGNOSIS — F321 Major depressive disorder, single episode, moderate: Secondary | ICD-10-CM

## 2020-01-14 DIAGNOSIS — I10 Essential (primary) hypertension: Secondary | ICD-10-CM

## 2020-01-14 NOTE — Chronic Care Management (AMB) (Addendum)
  Chronic Care Management    Clinical Social Work Follow Up Note  01/14/2020 Name: Stacy Dalton MRN: [id number] DOB: 08/19/1950  Stacy Dalton is a 69 y.o. year old female who is a primary care patient of Stacy Balloon, FNP. The CCM team was consulted for assistance with Stacy Dalton .   Review of patient status, including review of consultants reports, other relevant assessments, and collaboration with appropriate care team members and the patient's provider was performed as part of comprehensive patient evaluation and provision of chronic care management services.    SDOH (Social Determinants of Health) assessments performed: No; risk for social isolation; risk for tobacco use; risk of physical inactivity    Clinical Support from 09/02/2019 in [place]  PHQ-9 Total Score 7         Outpatient Encounter Medications as of 01/14/2020  Medication Sig Note   albuterol (PROVENTIL) (2.5 MG/3ML) 0.083% nebulizer solution Take 3 mLs (2.5 mg total) by nebulization every 6 (six) hours as needed for wheezing or shortness of breath.    albuterol (VENTOLIN HFA) 108 (90 Base) MCG/ACT inhaler Inhale 2 puffs into the lungs every 6 (six) hours as needed for wheezing or shortness of breath.     amLODipine-olmesartan (AZOR) 10-40 MG tablet Take 1 tablet by mouth daily.    Aspirin-Salicylamide-Caffeine (BC HEADACHE POWDER PO) Take 1 Dose by mouth daily as needed.    atorvastatin (LIPITOR) 20 MG tablet Take 1 tablet (20 mg total) by mouth daily. (Patient not taking: Reported on 11/05/2019)    DULoxetine (CYMBALTA) 60 MG capsule Take 1 capsule (60 mg total) by mouth daily.    Fluticasone Furoate-Vilanterol (BREO ELLIPTA IN) Inhale 1 puff into the lungs daily.    levothyroxine (SYNTHROID) 25 MCG tablet Take 1 tablet (25 mcg total) by mouth daily before breakfast.    ondansetron (ZOFRAN) 4 MG tablet Take 1 tablet (4 mg total) by mouth every 8 (eight) hours as needed for  nausea.    rOPINIRole (REQUIP) 2 MG tablet Take 1 tablet (2 mg total) by mouth at bedtime. (Patient not taking: Reported on 11/05/2019) 10/11/2019: Pt states it makes her feel bad   temazepam (RESTORIL) 15 MG capsule Take 1 capsule (15 mg total) by mouth at bedtime.    traMADol (ULTRAM) 50 MG tablet Take 2 tablets (100 mg total) by mouth every 8 (eight) hours as needed (pain).    Vitamin D, Ergocalciferol, (DRISDOL) 1.25 MG (50000 UT) CAPS capsule TAKE 1 CAPSULE (50000 UNITS) BY MOUTH EVERY 7 DAYS (Patient taking differently: Take 50,000 Units by mouth every 7 (seven) days. TAKE 1 CAPSULE (50000 UNITS) BY MOUTH EVERY 7 DAYS)    No facility-administered encounter medications on file as of 01/14/2020.     Upon chart review, LCSW saw that client had been dismissed from practice. LCSW called RNCM Stacy Dalton today to discuss this information. LCSW called Stacy Dalton, son of client today. Stacy Dalton said client has returned home from recent ED visit. Stacy Dalton said he thought client was doing well since returning to her home.   Follow Up Plan: LCSW to call client/son of client in next 4 weeks to assess client needs at that time  Stacy Dalton.Stacy Dalton MSW, LCSW Licensed Clinical Social Worker [facility] Care [facility] [phone]  I have reviewed the CCM documentation and agree with the written assessment and plan of care.  Stacy Dun, FNP

## 2020-01-14 NOTE — Patient Instructions (Addendum)
Licensed Clinical Social Worker Visit Information  Materials Provided: No  01/14/2020  Name: Stacy Dalton       MRN: [id number]       DOB: 03-13-1951  Stacy Dalton is a 70 y.o. year old female who is a primary care patient of [name], FNP. The CCM team was consulted for assistance with [name] .   Review of patient status, including review of consultants reports, other relevant assessments, and collaboration with appropriate care team members and the patient's provider was performed as part of comprehensive patient evaluation and provision of chronic care management services.    SDOH (Social Determinants of Health) assessments performed: No; risk for social isolation; risk for tobacco use; risk of physical inactivity   Upon chart review, LCSW saw that client had been dismissed from practice. LCSW called RNCM [name] today to discuss this information. LCSW called [name], son of client today. [name] said client has returned home from recent ED visit. [name] said he thought client was doing well since returning to her home.   Follow Up Plan: LCSW to call client/son of client in next 4 weeks to assess client needs at that time   The patient/Stacy Dalton, son,verbalized understanding of instructions provided today and declined a print copy of patient instruction materials.   Norva Riffle.Meital Riehl MSW, LCSW Licensed Clinical Social Worker [facility] [phone]

## 2020-01-16 ENCOUNTER — Ambulatory Visit (INDEPENDENT_AMBULATORY_CARE_PROVIDER_SITE_OTHER): Payer: Medicare Other | Admitting: Family

## 2020-01-16 ENCOUNTER — Encounter: Payer: Self-pay | Admitting: Family

## 2020-01-16 DIAGNOSIS — F321 Major depressive disorder, single episode, moderate: Secondary | ICD-10-CM

## 2020-01-16 DIAGNOSIS — E039 Hypothyroidism, unspecified: Secondary | ICD-10-CM

## 2020-01-16 DIAGNOSIS — G894 Chronic pain syndrome: Secondary | ICD-10-CM

## 2020-01-16 DIAGNOSIS — I10 Essential (primary) hypertension: Secondary | ICD-10-CM | POA: Diagnosis not present

## 2020-01-16 DIAGNOSIS — J42 Unspecified chronic bronchitis: Secondary | ICD-10-CM | POA: Diagnosis not present

## 2020-01-16 DIAGNOSIS — M546 Pain in thoracic spine: Secondary | ICD-10-CM

## 2020-01-16 DIAGNOSIS — F1120 Opioid dependence, uncomplicated: Secondary | ICD-10-CM

## 2020-01-16 DIAGNOSIS — K219 Gastro-esophageal reflux disease without esophagitis: Secondary | ICD-10-CM

## 2020-01-16 DIAGNOSIS — J441 Chronic obstructive pulmonary disease with (acute) exacerbation: Secondary | ICD-10-CM

## 2020-01-16 DIAGNOSIS — F419 Anxiety disorder, unspecified: Secondary | ICD-10-CM

## 2020-01-16 DIAGNOSIS — G4700 Insomnia, unspecified: Secondary | ICD-10-CM

## 2020-01-16 DIAGNOSIS — E785 Hyperlipidemia, unspecified: Secondary | ICD-10-CM

## 2020-01-16 DIAGNOSIS — G8929 Other chronic pain: Secondary | ICD-10-CM

## 2020-01-16 DIAGNOSIS — Z0289 Encounter for other administrative examinations: Secondary | ICD-10-CM

## 2020-01-16 IMAGING — DX CHEST - 2 VIEW
2 series · 2 of 2 positions shown · non-contrast
Comparison: 09/19/2018

CLINICAL DATA: Pneumonia

EXAM:
CHEST - 2 VIEW

[chest pa]
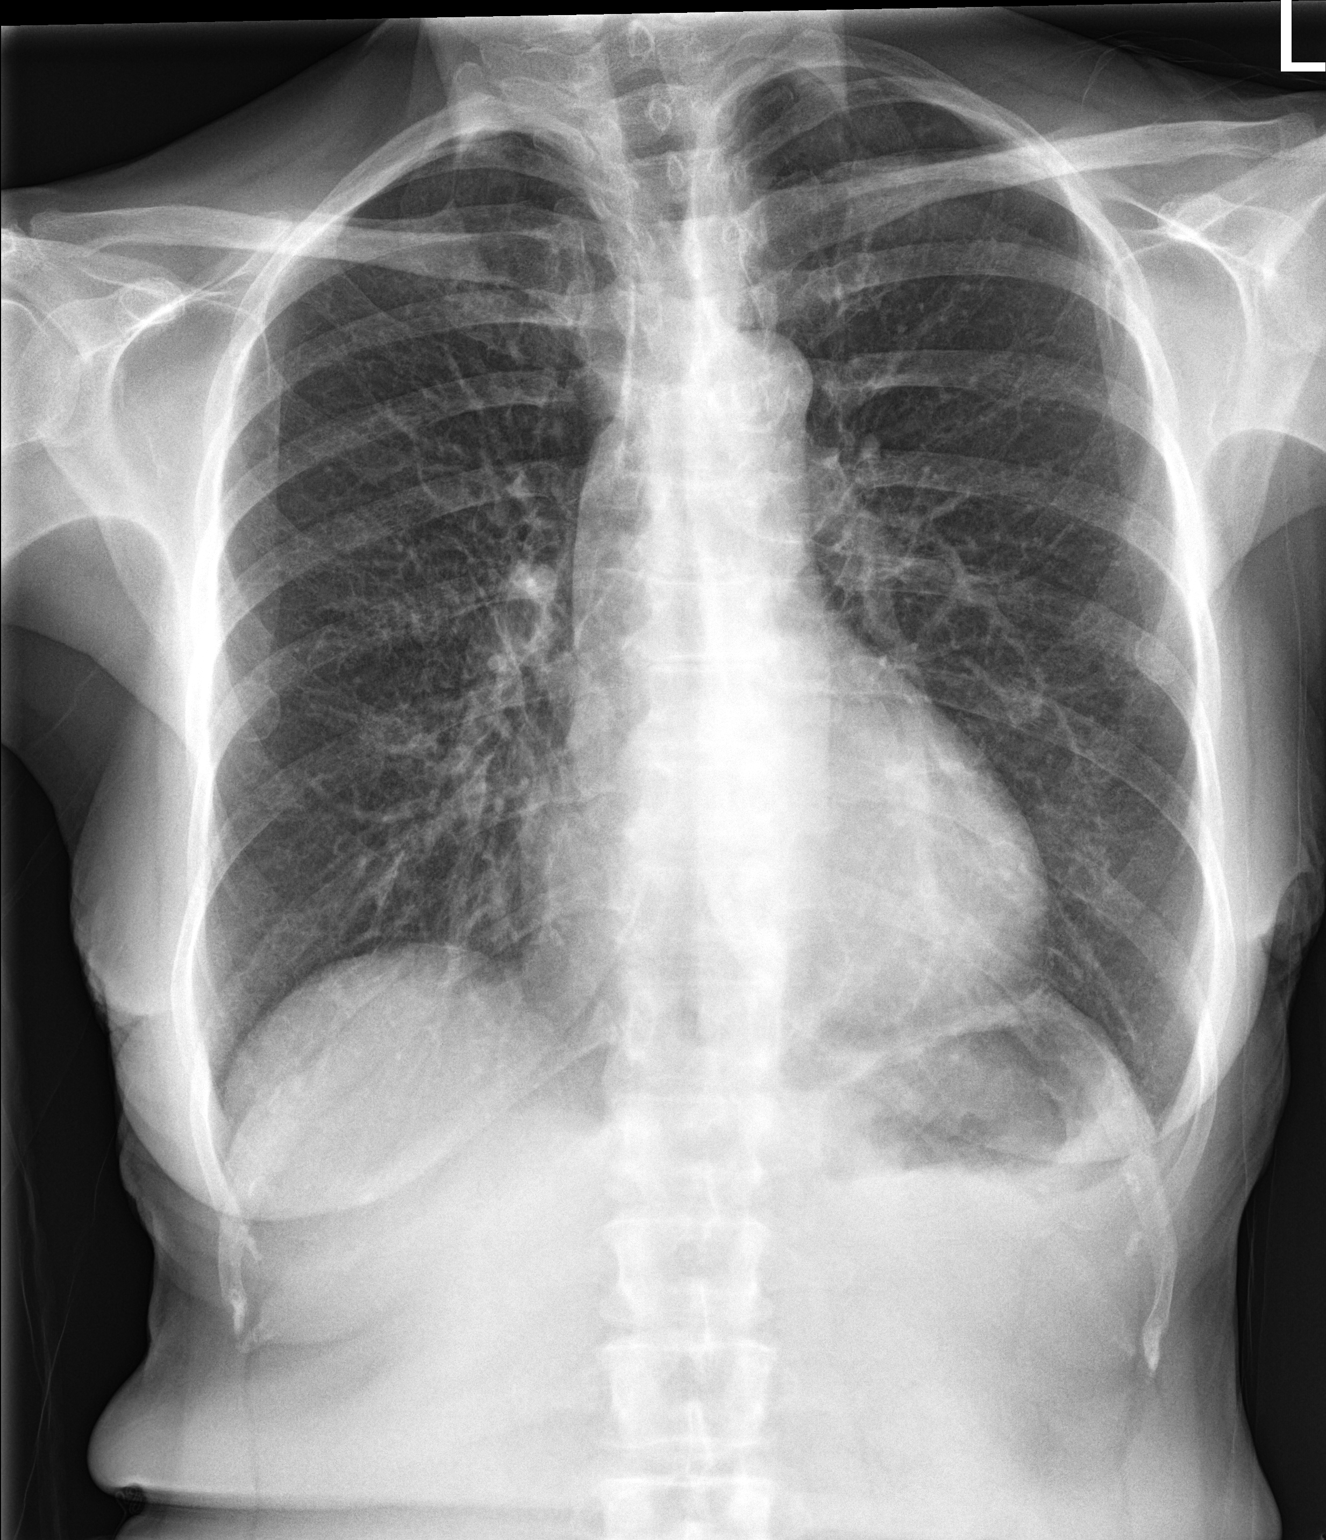

[chest lat]
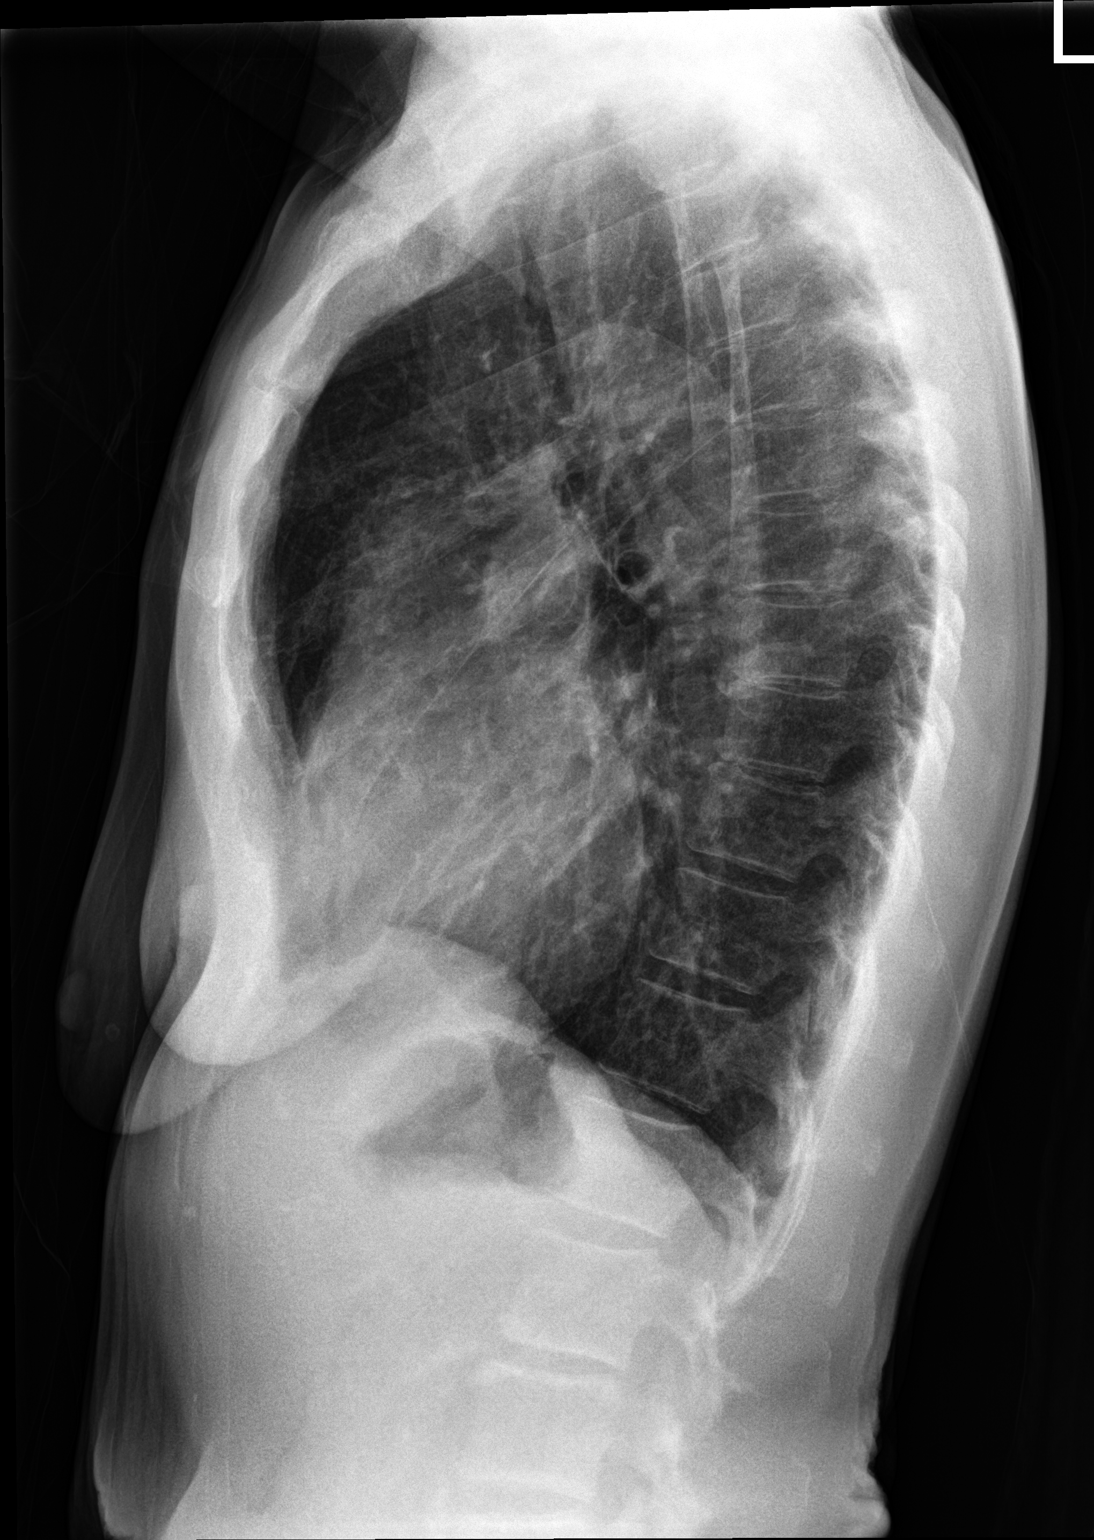

[2 of 2 positions shown; findings below may reference images not displayed]

FINDINGS: Improved bibasilar aeration compatible with resolving airspace
process/pneumonia. Normal heart size and vascularity. No current
airspace disease, collapse or consolidation. Negative for edema,
effusion or pneumothorax. Trachea midline. Aorta atherosclerotic.
Degenerative changes of the spine.
IMPRESSION: Resolved bibasilar airspace process/pneumonia.

No current acute chest process.

## 2020-01-16 MED ORDER — TEMAZEPAM 15 MG PO CAPS: 15.0000 mg | ORAL_CAPSULE | Freq: Every day | ORAL | 2 refills | Status: DC

## 2020-01-16 MED ORDER — AMLODIPINE-OLMESARTAN 10-40 MG PO TABS: 1.0000 | ORAL_TABLET | Freq: Every day | ORAL | 3 refills | Status: DC

## 2020-01-16 MED ORDER — HYDROXYZINE PAMOATE 25 MG PO CAPS: 25.0000 mg | ORAL_CAPSULE | Freq: Three times a day (TID) | ORAL | 2 refills | Status: DC | PRN

## 2020-01-16 MED ORDER — BREO ELLIPTA 200-25 MCG/INH IN AEPB: 1.0000 | INHALATION_SPRAY | Freq: Every day | RESPIRATORY_TRACT | 11 refills | Status: DC

## 2020-01-16 MED ORDER — TRAMADOL HCL 50 MG PO TABS: 100.0000 mg | ORAL_TABLET | Freq: Three times a day (TID) | ORAL | 2 refills | Status: DC | PRN

## 2020-01-16 NOTE — Progress Notes (Signed)
Virtual Visit via telephone Note Due to COVID-19 pandemic this visit was conducted virtually. This visit type was conducted due to national recommendations for restrictions regarding the COVID-19 Pandemic (e.g. social distancing, sheltering in place) in an effort to limit this patient's exposure and mitigate transmission in our community. All issues noted in this document were discussed and addressed.  A physical exam was not performed with this format.  I connected with Stacy Dalton on 01/16/20 at 2:08 pm  by telephone and verified that I am speaking with the correct person using two identifiers. Stacy Dalton is currently located at home and no one  is currently with her during visit. The provider, Evelina Dun, FNP is located in their office at time of visit.  I discussed the limitations, risks, security and privacy concerns of performing an evaluation and management service by telephone and the availability of in person appointments. I also discussed with the patient that there may be a patient responsible charge related to this service. The patient expressed understanding and agreed to proceed.   History and Present Illness:  Ptcallsthe office today for chronic follow up. PT has pulmonologistsfor COPD and chronic cough, but states she has not seen them since COVID. Hypertension This is a chronic problem. The current episode started more than 1 year ago. The problem has been resolved since onset. The problem is controlled. Associated symptoms include anxiety and shortness of breath. Pertinent negatives include no malaise/fatigue or peripheral edema. Risk factors for coronary artery disease include dyslipidemia and sedentary lifestyle. The current treatment provides moderate improvement. There is no history of CAD/MI. Identifiable causes of hypertension include a thyroid problem.  Gastroesophageal Reflux She complains of belching and heartburn. This is a chronic problem. The current  episode started more than 1 year ago. The problem occurs occasionally. The problem has been waxing and waning. Associated symptoms include fatigue. She has tried a diet change and an antacid for the symptoms. The treatment provided moderate relief.  Constipation This is a chronic problem. The current episode started more than 1 year ago. The problem has been resolved since onset. Her stool frequency is 1 time per day. Associated symptoms include back pain. She has tried laxatives for the symptoms. The treatment provided moderate relief.  Thyroid Problem Presents for follow-up visit. Symptoms include anxiety, constipation, depressed mood and fatigue. Patient reports no diaphoresis. The symptoms have been stable.  Anemia Presents for follow-up visit. There has been no malaise/fatigue.  Depression        This is a chronic problem.  The current episode started more than 1 year ago.   The onset quality is gradual.   The problem occurs intermittently.  The problem has been waxing and waning since onset.  Associated symptoms include fatigue, irritable, restlessness, decreased interest and sad.     The symptoms are aggravated by family issues.  Past treatments include SNRIs - Serotonin and norepinephrine reuptake inhibitors.  Compliance with treatment is good.  Past medical history includes thyroid problem and anxiety.   Back Pain This is a chronic problem. The current episode started more than 1 year ago. The problem occurs intermittently. The problem has been waxing and waning since onset. The pain is present in the lumbar spine.  Anxiety Presents for follow-up visit. Symptoms include depressed mood, excessive worry, irritability, nervous/anxious behavior, restlessness and shortness of breath. Symptoms occur most days. The severity of symptoms is moderate. The quality of sleep is good.   Her past medical history  is significant for anemia.   COPD Pt using Breo daily. Has not had to use albuterol inhaler.      Review of Systems  Constitutional: Positive for fatigue and irritability. Negative for diaphoresis and malaise/fatigue.  Respiratory: Positive for shortness of breath.   Gastrointestinal: Positive for constipation and heartburn.  Musculoskeletal: Positive for back pain.  Psychiatric/Behavioral: Positive for depression. The patient is nervous/anxious.   All other systems reviewed and are negative.    Observations/Objective: No SOB or distress noted   Assessment and Plan: 1. Essential hypertension - amLODipine-olmesartan (AZOR) 10-40 MG tablet; Take 1 tablet by mouth daily.  Dispense: 90 tablet; Refill: 3  2. Chronic bronchitis, unspecified chronic bronchitis type (HCC) - fluticasone furoate-vilanterol (BREO ELLIPTA) 200-25 MCG/INH AEPB; Inhale 1 puff into the lungs daily.  Dispense: 1 each; Refill: 11  3. Gastroesophageal reflux disease without esophagitis  4. Hypothyroidism, unspecified type  5. Anxiety - hydrOXYzine (VISTARIL) 25 MG capsule; Take 1 capsule (25 mg total) by mouth 3 (three) times daily as needed.  Dispense: 60 capsule; Refill: 2  6. Chronic bilateral thoracic back pain - traMADol (ULTRAM) 50 MG tablet; Take 2 tablets (100 mg total) by mouth every 8 (eight) hours as needed (pain).  Dispense: 180 tablet; Refill: 2  7. Current moderate episode of major depressive disorder, unspecified whether recurrent ([place])  8. Hyperlipidemia, unspecified hyperlipidemia type  9. Insomnia, unspecified type - temazepam (RESTORIL) 15 MG capsule; Take 1 capsule (15 mg total) by mouth at bedtime.  Dispense: 90 capsule; Refill: 2  10. Uncomplicated opioid dependence (HCC) - traMADol (ULTRAM) 50 MG tablet; Take 2 tablets (100 mg total) by mouth every 8 (eight) hours as needed (pain).  Dispense: 180 tablet; Refill: 2  11. Pain medication agreement signed - traMADol (ULTRAM) 50 MG tablet; Take 2 tablets (100 mg total) by mouth every 8 (eight) hours as needed (pain).  Dispense: 180  tablet; Refill: 2  12. Chronic obstructive pulmonary disease with acute exacerbation (HCC) - fluticasone furoate-vilanterol (BREO ELLIPTA) 200-25 MCG/INH AEPB; Inhale 1 puff into the lungs daily.  Dispense: 1 each; Refill: 11  13. Chronic pain syndrome - traMADol (ULTRAM) 50 MG tablet; Take 2 tablets (100 mg total) by mouth every 8 (eight) hours as needed (pain).  Dispense: 180 tablet; Refill: 2  Patient reviewed in [place] controlled database, no flags noted. Contract and drug screen are up to date.     I discussed the assessment and treatment plan with the patient. The patient was provided an opportunity to ask questions and all were answered. The patient agreed with the plan and demonstrated an understanding of the instructions.   The patient was advised to call back or seek an in-person evaluation if the symptoms worsen or if the condition fails to improve as anticipated.  The above assessment and management plan was discussed with the patient. The patient verbalized understanding of and has agreed to the management plan. Patient is aware to call the clinic if symptoms persist or worsen. Patient is aware when to return to the clinic for a follow-up visit. Patient educated on when it is appropriate to go to the emergency department.   Time call ended:  2:27 pm  I provided 19 minutes of non-face-to-face time during this encounter.    Evelina Dun, FNP

## 2020-01-29 ENCOUNTER — Telehealth: Payer: Self-pay | Admitting: Family

## 2020-01-29 NOTE — Telephone Encounter (Signed)
Pt called stating that she normally takes 30 mg of her Restoril Rx at bedtime each night but says this last time it was filled it said to take 15 mg at bedtime. Pt said she thought maybe there was just a miss print on the directions because she doesn't remember being told by PCP that is was being decreased to 15 mg so pt has been taking 2 tablets at bedtime to equal 30 mg and now she is almost out of her Rx and cant get a refill until next month. Wants to know if PCP can call pharmacy to override so that pt can pick up a refill.  [facility] pharmacy to verify and was told that pt picked up her last refill in May for 90 days supply and they cant refill until next month)

## 2020-01-29 NOTE — Telephone Encounter (Signed)
Lmtcb. Pt was decreased to 15 mg at her visit on 06/17/19. TC on 10/14/19 states pt is to continue on current dose

## 2020-01-31 ENCOUNTER — Other Ambulatory Visit: Payer: Self-pay

## 2020-01-31 ENCOUNTER — Emergency Department (HOSPITAL_COMMUNITY)
Admission: EM | Admit: 2020-01-31 | Discharge: 2020-01-31 | Disposition: A | Discharge status: Home / Self Care | Payer: Medicare Other | Attending: Emergency Medicine | Admitting: Emergency Medicine

## 2020-01-31 ENCOUNTER — Encounter (HOSPITAL_COMMUNITY): Payer: Self-pay | Admitting: Emergency Medicine

## 2020-01-31 DIAGNOSIS — M545 Low back pain, unspecified: Secondary | ICD-10-CM

## 2020-01-31 DIAGNOSIS — F10920 Alcohol use, unspecified with intoxication, uncomplicated: Secondary | ICD-10-CM | POA: Insufficient documentation

## 2020-01-31 DIAGNOSIS — Z87891 Personal history of nicotine dependence: Secondary | ICD-10-CM | POA: Insufficient documentation

## 2020-01-31 DIAGNOSIS — I959 Hypotension, unspecified: Secondary | ICD-10-CM | POA: Diagnosis not present

## 2020-01-31 DIAGNOSIS — G8929 Other chronic pain: Secondary | ICD-10-CM | POA: Insufficient documentation

## 2020-01-31 DIAGNOSIS — J449 Chronic obstructive pulmonary disease, unspecified: Secondary | ICD-10-CM | POA: Insufficient documentation

## 2020-01-31 DIAGNOSIS — I10 Essential (primary) hypertension: Secondary | ICD-10-CM | POA: Insufficient documentation

## 2020-01-31 DIAGNOSIS — M5489 Other dorsalgia: Secondary | ICD-10-CM | POA: Diagnosis not present

## 2020-01-31 DIAGNOSIS — E039 Hypothyroidism, unspecified: Secondary | ICD-10-CM | POA: Diagnosis not present

## 2020-01-31 LAB — COMPREHENSIVE METABOLIC PANEL
ALT: 17 U/L (ref 0–44)
AST: 30 U/L (ref 15–41)
Albumin: 4 g/dL (ref 3.5–5.0)
Alkaline Phosphatase: 73 U/L (ref 38–126)
Anion gap: 14 (ref 5–15)
BUN: 19 mg/dL (ref 8–23)
CO2: 25 mmol/L (ref 22–32)
Calcium: 8.8 mg/dL — ABNORMAL LOW (ref 8.9–10.3)
Chloride: 102 mmol/L (ref 98–111)
Creatinine, Ser: 1 mg/dL (ref 0.44–1.00)
GFR calc Af Amer: >60 mL/min (ref >60)
GFR calc non Af Amer: 57 mL/min — ABNORMAL LOW (ref >60)
Glucose, Bld: 86 mg/dL (ref 70–99)
Potassium: 2.6 mmol/L — CL (ref 3.5–5.1)
Sodium: 141 mmol/L (ref 135–145)
Total Bilirubin: 0.4 mg/dL (ref 0.3–1.2)
Total Protein: 6.7 g/dL (ref 6.5–8.1)

## 2020-01-31 LAB — RAPID URINE DRUG SCREEN, HOSP PERFORMED
Amphetamines: NOT DETECTED
Barbiturates: NOT DETECTED
Benzodiazepines: NOT DETECTED
Cocaine: NOT DETECTED
Opiates: NOT DETECTED
Tetrahydrocannabinol: NOT DETECTED

## 2020-01-31 LAB — CBC
HCT: 36.9 % (ref 36.0–46.0)
Hemoglobin: 12.2 g/dL (ref 12.0–15.0)
MCH: 26.6 pg (ref 26.0–34.0)
MCHC: 33.1 g/dL (ref 30.0–36.0)
MCV: 80.4 fL (ref 80.0–100.0)
Platelets: 373 10*3/uL (ref 150–400)
RBC: 4.59 MIL/uL (ref 3.87–5.11)
RDW: 16.2 % — ABNORMAL HIGH (ref 11.5–15.5)
WBC: 5.5 10*3/uL (ref 4.0–10.5)
nRBC: 0 % (ref 0.0–0.2)

## 2020-01-31 LAB — AMMONIA: Ammonia: 20 umol/L (ref 9–35)

## 2020-01-31 LAB — ETHANOL: Alcohol, Ethyl (B): 295 mg/dL — ABNORMAL HIGH (ref <10)

## 2020-01-31 MED ORDER — POTASSIUM CHLORIDE CRYS ER 20 MEQ PO TBCR
40.0000 meq | EXTENDED_RELEASE_TABLET | Freq: Once | ORAL | Status: AC
  Administered 2020-01-31: 40 meq via ORAL
  Filled 2020-01-31: qty 2

## 2020-01-31 NOTE — ED Provider Notes (Signed)
[facility] Emergency Department Provider Note MRN:  [id number]  Arrival date & time: 01/31/20     Chief Complaint   Alcohol Intoxication and Back Pain   History of Present Illness   Stacy Dalton is a 69 y.o. year-old female with a history of COPD, chronic back pain presenting to the ED with chief complaint of back pain.  Patient has been struggling with chronic back pain for years.  She admits to alcohol use today to help with the pain.  She denies any numbness or weakness, no bowel or bladder dysfunction, her pain is relatively controlled at this point due to the alcohol.  Denies any other symptoms, no chest pain or shortness of breath, no abdominal pain.  Review of Systems  A complete 10 system review of systems was obtained and all systems are negative except as noted in the HPI and PMH.   Patient's Health History    Past Medical History:  Diagnosis Date  . Chronic pain   . COPD (chronic obstructive pulmonary disease) ([place])    told has copd, no current inhaler use  . Cough   . Depression   . GERD (gastroesophageal reflux disease)   . Hypertension   . Hypothyroidism   . Insomnia   . Migraine   . Migraine   . Osteopenia   . Pain management   . Panic attacks   . Small bowel obstruction [facility])     Past Surgical History:  Procedure Laterality Date  . ABDOMINAL HYSTERECTOMY    . COLON SURGERY    . COLOSTOMY CLOSURE  04/19/2012   Procedure: COLOSTOMY CLOSURE;  Surgeon: Adin Hector, MD;  Location: [facility] ORS;  Service: General;  Laterality: N/A;  Laparotomy, Resection and Closure of Colostomy  . COLOSTOMY TAKEDOWN N/A 04/12/2016   Procedure: [place] TAKEDOWN;  Surgeon: Johnathan Hausen, MD;  Location: [facility];  Service: General;  Laterality: N/A;  . INCONTINENCE SURGERY    . LAPAROTOMY  10/04/2011, colostomy also   Procedure: EXPLORATORY LAPAROTOMY;  Surgeon: Adin Hector, MD;  Location: [facility];  Service: General;  Laterality: N/A;  left  partial colectomy with colostomy  . LAPAROTOMY  04/19/2012   Procedure: EXPLORATORY LAPAROTOMY;  Surgeon: Adin Hector, MD;  Location: [facility];  Service: General;  Laterality: N/A;  . LAPAROTOMY N/A 11/29/2015   Procedure: EXPLORATORY LAPAROTOMY; SUBTOTAL COLECTOMY WITH HARTMAN PROCEDURE AND END COLOSTOMY;  Surgeon: Johnathan Hausen, MD;  Location: [facility];  Service: General;  Laterality: N/A;  . TUBAL LIGATION    . VENTRAL HERNIA REPAIR  04/19/2012   Procedure: HERNIA REPAIR VENTRAL ADULT;  Surgeon: Adin Hector, MD;  Location: [facility];  Service: General;  Laterality: N/A;    Family History  Problem Relation Age of Onset  . Heart disease Mother   . Hypertension Mother   . Cancer Sister        sinus  . Diabetes Brother   . Heart disease Brother   . Thyroid disease Brother   . Cancer Sister        abdominal ?  . Thyroid disease Sister   . Cancer Other        GE junction adenocarcinoma  . Emphysema Father   . Stroke Father   . Hypertension Father   . Heart disease Father   . COPD Sister   . Thyroid disease Sister   . Thyroid disease Sister   . Diabetes Brother   . Thyroid disease Brother   .  Thyroid disease Brother   . Hypertension Brother   . Post-traumatic stress disorder Brother   . COPD Brother   . Thyroid disease Brother   . Thyroid disease Brother   . Hypertension Brother   . Transient ischemic attack Brother     Social History   Socioeconomic History  . Marital status: Widowed    Spouse name: Not on file  . Number of children: 5  . Years of education: 8  . Highest education level: 8th grade  Occupational History  . Occupation: Retired  Tobacco Use  . Smoking status: Former Smoker    Packs/day: 1.50    Years: 20.00    Pack years: 30.00    Types: Cigarettes    Quit date: 10/04/1998    Years since quitting: 21.3  . Smokeless tobacco: Never Used  Vaping Use  . Vaping Use: Never used  Substance and Sexual Activity  . Alcohol use: No  . Drug use: No  .  Sexual activity: Not Currently  Other Topics Concern  . Not on file  Social History Narrative  . Not on file   Social Determinants of Health   Financial Resource Strain: Low Risk   . Difficulty of Paying Living Expenses: Not hard at all  Food Insecurity: No Food Insecurity  . Worried About Charity fundraiser in the Last Year: Never true  . Ran Out of Food in the Last Year: Never true  Transportation Needs: No Transportation Needs  . Lack of Transportation (Medical): No  . Lack of Transportation (Non-Medical): No  Physical Activity: Inactive  . Days of Exercise per Week: 0 days  . Minutes of Exercise per Session: 0 min  Stress: No Stress Concern Present  . Feeling of Stress : Only a little  Social Connections: Socially Isolated  . Frequency of Communication with Friends and Family: More than three times a week  . Frequency of Social Gatherings with Friends and Family: Never  . Attends Religious Services: Never  . Active Member of Clubs or Organizations: No  . Attends Archivist Meetings: Never  . Marital Status: Widowed  Intimate Partner Violence: Not At Risk  . Fear of Current or Ex-Partner: No  . Emotionally Abused: No  . Physically Abused: No  . Sexually Abused: No     Physical Exam   Vitals:   01/31/20 1322 01/31/20 1619  BP: 121/71 (!) 120/52  Pulse: 75 80  Resp: 16 14  Temp: 98.3 F (36.8 C) 98.1 F (36.7 C)  SpO2: 100% 100%    CONSTITUTIONAL: Chronically ill-appearing, NAD NEURO: Mildly inebriated but alert and oriented x3, normal and symmetric strength and sensation, normal speech EYES:  eyes equal and reactive ENT/NECK:  no LAD, no JVD CARDIO: Regular rate, well-perfused, normal S1 and S2 PULM:  CTAB no wheezing or rhonchi GI/GU:  normal bowel sounds, non-distended, non-tender MSK/SPINE:  No gross deformities, no edema SKIN:  no rash, atraumatic PSYCH:  Appropriate speech and behavior  *Additional and/or pertinent findings included in MDM  below  Diagnostic and Interventional Summary    EKG Interpretation  Date/Time:    Ventricular Rate:    PR Interval:    QRS Duration:   QT Interval:    QTC Calculation:   R Axis:     Text Interpretation:        Labs Reviewed  COMPREHENSIVE METABOLIC PANEL - Abnormal; Notable for the following components:      Result Value   Potassium 2.6 (*)  Calcium 8.8 (*)    GFR calc non Af Amer 57 (*)    All other components within normal limits  ETHANOL - Abnormal; Notable for the following components:   Alcohol, Ethyl (B) 295 (*)    All other components within normal limits  CBC - Abnormal; Notable for the following components:   RDW 16.2 (*)    All other components within normal limits  RAPID URINE DRUG SCREEN, HOSP PERFORMED  AMMONIA    No orders to display    Medications  potassium chloride SA (KLOR-CON) CR tablet 40 mEq (40 mEq Oral Given 01/31/20 1618)     Procedures  /  Critical Care Procedures  ED Course and Medical Decision Making  I have reviewed the triage vital signs, the nursing notes, and pertinent available records from the EMR.  Listed above are laboratory and imaging tests that I personally ordered, reviewed, and interpreted and then considered in my medical decision making (see below for details).      Alcohol intoxication with chronic back pain, no red flag symptoms to suggest myelopathy, normal vital signs, reassuring exam.  Patient is sober enough for discharge.  Labs reassuring, potassium repleted here in the emergency department.    Barth Kirks. Sedonia Small, [place] [email or web address]  Final Clinical Impressions(s) / ED Diagnoses     ICD-10-CM   1. Chronic low back pain, unspecified back pain laterality, unspecified whether sciatica present  M54.5    G89.29   2. Alcoholic intoxication without complication ([place])  J17.915     ED Discharge Orders    None       Discharge Instructions Discussed  with and Provided to Patient:     Discharge Instructions     You were evaluated in the Emergency Department and after careful evaluation, we did not find any emergent condition requiring admission or further testing in the hospital.  Your exam/testing today was overall reassuring.  Please return to the Emergency Department if you experience any worsening of your condition.  Thank you for allowing [place] to be a part of your care.       Maudie Flakes, MD 01/31/20 1650

## 2020-01-31 NOTE — ED Notes (Signed)
Pt given phone at this time to call her family.

## 2020-01-31 NOTE — ED Notes (Signed)
Pt ready fr discharge   Son Bethann Punches [phone]  No answer and no VM

## 2020-01-31 NOTE — ED Notes (Signed)
Critical Call from lab  K 2.6  ETOH 295

## 2020-01-31 NOTE — ED Triage Notes (Signed)
Pt with liver disease Chronic back pain  Has been drinking [employer]   Evidently told by her brother if her back hurt, she should drink  Son came to check on mother today and found her in the throes of intoxication   States she she drinks her ammonia levels rise   Son:  [name] [phone]

## 2020-01-31 NOTE — Discharge Instructions (Signed)
You were evaluated in the Emergency Department and after careful evaluation, we did not find any emergent condition requiring admission or further testing in the hospital.  Your exam/testing today was overall reassuring.  Please return to the Emergency Department if you experience any worsening of your condition.  Thank you for allowing us to be a part of your care.  

## 2020-02-03 ENCOUNTER — Other Ambulatory Visit: Payer: Self-pay | Admitting: Family

## 2020-02-03 DIAGNOSIS — A084 Viral intestinal infection, unspecified: Secondary | ICD-10-CM

## 2020-02-03 MED ORDER — ONDANSETRON HCL 4 MG PO TABS: 4.0000 mg | ORAL_TABLET | Freq: Three times a day (TID) | ORAL | 3 refills | Status: DC | PRN

## 2020-02-03 NOTE — Telephone Encounter (Signed)
  Prescription Request  02/03/2020  What is the name of the medication or equipment? [email or web address]  Have you contacted your pharmacy to request a refill? (if applicable) no  Which pharmacy would you like this sent to? [facility] [place]   Patient notified that their request is being sent to the clinical staff for review and that they should receive a response within 2 business days.

## 2020-02-09 ENCOUNTER — Telehealth: Payer: Self-pay | Admitting: *Deleted

## 2020-02-09 MED ORDER — FLUTICASONE-SALMETEROL 100-50 MCG/DOSE IN AEPB
1.0000 | INHALATION_SPRAY | Freq: Two times a day (BID) | RESPIRATORY_TRACT | 4 refills | Status: DC
Start: 2020-02-09 — End: 2020-04-27

## 2020-02-09 NOTE — Telephone Encounter (Signed)
Advair changed from [facility], Prescription sent to pharmacy.

## 2020-02-09 NOTE — Telephone Encounter (Signed)
[facility]:  She has [employer] prescription drug benefits   They prefer we change her BREO to Advair  They would like a new order for ADVAIR DISKUS 60 S 250/50 to be used BID Qty -3  Rf- 4

## 2020-02-16 NOTE — Telephone Encounter (Signed)
Med change has been sent to pharmacy.  This encounter will now be closed.

## 2020-02-19 ENCOUNTER — Encounter (HOSPITAL_COMMUNITY): Payer: Self-pay | Admitting: Emergency Medicine

## 2020-02-19 ENCOUNTER — Other Ambulatory Visit: Payer: Self-pay

## 2020-02-19 ENCOUNTER — Emergency Department (HOSPITAL_COMMUNITY)
Admission: EM | Admit: 2020-02-19 | Discharge: 2020-02-20 | Disposition: A | Discharge status: Home / Self Care | Payer: Medicare Other | Attending: Emergency Medicine | Admitting: Emergency Medicine

## 2020-02-19 DIAGNOSIS — I10 Essential (primary) hypertension: Secondary | ICD-10-CM | POA: Insufficient documentation

## 2020-02-19 DIAGNOSIS — E039 Hypothyroidism, unspecified: Secondary | ICD-10-CM | POA: Diagnosis not present

## 2020-02-19 DIAGNOSIS — N390 Urinary tract infection, site not specified: Secondary | ICD-10-CM | POA: Diagnosis not present

## 2020-02-19 DIAGNOSIS — Z7982 Long term (current) use of aspirin: Secondary | ICD-10-CM | POA: Insufficient documentation

## 2020-02-19 DIAGNOSIS — R4182 Altered mental status, unspecified: Secondary | ICD-10-CM | POA: Diagnosis present

## 2020-02-19 DIAGNOSIS — F10220 Alcohol dependence with intoxication, uncomplicated: Secondary | ICD-10-CM | POA: Insufficient documentation

## 2020-02-19 DIAGNOSIS — J449 Chronic obstructive pulmonary disease, unspecified: Secondary | ICD-10-CM | POA: Diagnosis not present

## 2020-02-19 DIAGNOSIS — Z79899 Other long term (current) drug therapy: Secondary | ICD-10-CM | POA: Insufficient documentation

## 2020-02-19 DIAGNOSIS — F10920 Alcohol use, unspecified with intoxication, uncomplicated: Secondary | ICD-10-CM

## 2020-02-19 DIAGNOSIS — Z79890 Hormone replacement therapy (postmenopausal): Secondary | ICD-10-CM | POA: Insufficient documentation

## 2020-02-19 DIAGNOSIS — Z87891 Personal history of nicotine dependence: Secondary | ICD-10-CM | POA: Diagnosis not present

## 2020-02-19 LAB — COMPREHENSIVE METABOLIC PANEL
ALT: 17 U/L (ref 0–44)
AST: 24 U/L (ref 15–41)
Albumin: 4.3 g/dL (ref 3.5–5.0)
Alkaline Phosphatase: 75 U/L (ref 38–126)
Anion gap: 17 — ABNORMAL HIGH (ref 5–15)
BUN: 19 mg/dL (ref 8–23)
CO2: 23 mmol/L (ref 22–32)
Calcium: 9.4 mg/dL (ref 8.9–10.3)
Chloride: 105 mmol/L (ref 98–111)
Creatinine, Ser: 0.96 mg/dL (ref 0.44–1.00)
GFR calc Af Amer: >60 mL/min (ref >60)
GFR calc non Af Amer: 60 mL/min — ABNORMAL LOW (ref >60)
Glucose, Bld: 78 mg/dL (ref 70–99)
Potassium: 3.6 mmol/L (ref 3.5–5.1)
Sodium: 145 mmol/L (ref 135–145)
Total Bilirubin: 0.2 mg/dL — ABNORMAL LOW (ref 0.3–1.2)
Total Protein: 7.8 g/dL (ref 6.5–8.1)

## 2020-02-19 LAB — ETHANOL: Alcohol, Ethyl (B): 273 mg/dL — ABNORMAL HIGH (ref <10)

## 2020-02-19 LAB — CBC
HCT: 43.7 % (ref 36.0–46.0)
Hemoglobin: 14 g/dL (ref 12.0–15.0)
MCH: 26.5 pg (ref 26.0–34.0)
MCHC: 32 g/dL (ref 30.0–36.0)
MCV: 82.6 fL (ref 80.0–100.0)
Platelets: 373 10*3/uL (ref 150–400)
RBC: 5.29 MIL/uL — ABNORMAL HIGH (ref 3.87–5.11)
RDW: 18.1 % — ABNORMAL HIGH (ref 11.5–15.5)
WBC: 6.8 10*3/uL (ref 4.0–10.5)
nRBC: 0 % (ref 0.0–0.2)

## 2020-02-19 LAB — AMMONIA: Ammonia: 34 umol/L (ref 9–35)

## 2020-02-19 LAB — CK: Total CK: 53 U/L (ref 38–234)

## 2020-02-19 LAB — LACTIC ACID, PLASMA: Lactic Acid, Venous: 3.6 mmol/L (ref 0.5–1.9)

## 2020-02-19 LAB — CBG MONITORING, ED: Glucose-Capillary: 80 mg/dL (ref 70–99)

## 2020-02-19 MED ORDER — DEXTROSE-NACL 5-0.9 % IV SOLN: INTRAVENOUS | Status: DC

## 2020-02-19 MED ORDER — DEXTROSE 5 % AND 0.9 % NACL IV BOLUS
500.0000 mL | Freq: Once | INTRAVENOUS | Status: AC
  Administered 2020-02-19: 500 mL via INTRAVENOUS

## 2020-02-19 NOTE — ED Notes (Signed)
Date and time results received: 02/19/20 11:56 PM(use smartphrase ".now" to insert current time)  Test: lactic acid Critical Value: 3.6  Name of Provider Notified: Dr Rolland Porter  Orders Received? Or Actions Taken?: give IV fluids

## 2020-02-19 NOTE — ED Triage Notes (Signed)
Pt from home via [facility]. Ems called to pts home by neighbor for "unknown man down." Unknown of patients last normal. Pt screaming and hollering at staff during triage. Pt is awake but confused at this time.

## 2020-02-20 DIAGNOSIS — R4182 Altered mental status, unspecified: Secondary | ICD-10-CM | POA: Diagnosis not present

## 2020-02-20 LAB — URINALYSIS, ROUTINE W REFLEX MICROSCOPIC
Bacteria, UA: NONE SEEN
Bilirubin Urine: NEGATIVE
Glucose, UA: 50 mg/dL — AB
Hgb urine dipstick: NEGATIVE
Ketones, ur: NEGATIVE mg/dL
Nitrite: NEGATIVE
Protein, ur: NEGATIVE mg/dL
Specific Gravity, Urine: 1.01 (ref 1.005–1.030)
WBC, UA: >50 WBC/hpf — ABNORMAL HIGH (ref 0–5)
pH: 7 (ref 5.0–8.0)

## 2020-02-20 LAB — RAPID URINE DRUG SCREEN, HOSP PERFORMED
Amphetamines: NOT DETECTED
Barbiturates: NOT DETECTED
Benzodiazepines: POSITIVE — AB
Cocaine: NOT DETECTED
Opiates: NOT DETECTED
Tetrahydrocannabinol: NOT DETECTED

## 2020-02-20 LAB — LACTIC ACID, PLASMA: Lactic Acid, Venous: 3.3 mmol/L (ref 0.5–1.9)

## 2020-02-20 MED ORDER — CEPHALEXIN 500 MG PO CAPS: 500.0000 mg | ORAL_CAPSULE | Freq: Three times a day (TID) | ORAL | 0 refills | Status: AC

## 2020-02-20 MED ORDER — CEPHALEXIN 500 MG PO CAPS
500.0000 mg | ORAL_CAPSULE | Freq: Once | ORAL | Status: AC
  Administered 2020-02-20: 500 mg via ORAL
  Filled 2020-02-20: qty 1

## 2020-02-20 NOTE — Discharge Instructions (Addendum)
Look at the information to go get some help to help stop your drinking. YOU SHOULD NOT MIX ALCOHOL WITH THE TEMAZEPAM AND TRAMADOL THAT YOU TAKE!!! You may have a urinary tract infection, take the antibiotics until gone.  Recheck if you get fever, vomiting, or pain in your abdomen or back.

## 2020-02-20 NOTE — ED Notes (Signed)
Pt ambulatory to bathroom and back to room with standby assist- steady gait noted.

## 2020-02-20 NOTE — ED Provider Notes (Addendum)
[facility] EMERGENCY DEPARTMENT Provider Note   CSN: [id number] Arrival date & time: 02/19/20  2157   Time seen 11:20 AM  History Chief Complaint  Patient presents with  . Altered Mental Status    Stacy Dalton is a 69 y.o. female.  HPI   When I enter the room and asked the patient what is going on, she states "I don't know". When I asked her if she remembers what happened she states she does and states "I was acting a fool". She states she took all her regular medicine this morning and denies taking any other pills. She states she was drinking today and states she doesn't drink every day. She cannot tell me why she is drinking. She was asleep when I first entered her room but was easily awakened. In the middle of our interview she started shouting out "where are my babies!". She repeated this several times. She also states her back hurts. After a few more questions patient curls up and goes back to sleep.  PCP [name], FNP'   Past Medical History:  Diagnosis Date  . Chronic pain   . COPD (chronic obstructive pulmonary disease) ([place])    told has copd, no current inhaler use  . Cough   . Depression   . GERD (gastroesophageal reflux disease)   . Hypertension   . Hypothyroidism   . Insomnia   . Migraine   . Migraine   . Osteopenia   . Pain management   . Panic attacks   . Small bowel obstruction [facility])     Patient Active Problem List   Diagnosis Date Noted  . Multifocal pneumonia 09/19/2018  . Large bowel obstruction ([place])   . Small bowel obstruction ([place]) 07/20/2018  . Hypomagnesemia 07/20/2018  . Cough 03/14/2018  . Fatigue 03/14/2018  . Polypharmacy 02/23/2017  . Hypokalemia 02/23/2017  . Toxic metabolic encephalopathy 37/16/9678  . Anxiety 02/22/2017  . GERD (gastroesophageal reflux disease) 02/22/2017  . Seizure ([place])   . Pain medication agreement signed 10/10/2016  . Opioid dependence ([place]) 10/10/2016  . Status post colostomy takedown  04/12/2016  . Chronic constipation 12/03/2015  . Peritonitis with abscess of intestine ([place]) 11/29/2015  . COPD exacerbation ([place]) 09/20/2015  . Osteopenia 06/10/2014  . Vitamin D deficiency disease 06/10/2014  . Hypothyroidism   . Depression 11/18/2012  . Insomnia 11/18/2012  . Chronic pain syndrome 11/18/2012  . HLD (hyperlipidemia) 09/01/2012  . S/P colostomy ([place]) 04/19/2012  . Normocytic anemia 10/15/2011  . Hypertension 10/13/2011  . Chronic back pain 10/13/2011  . COPD (chronic obstructive pulmonary disease) ([place]) 10/09/2011    Past Surgical History:  Procedure Laterality Date  . ABDOMINAL HYSTERECTOMY    . COLON SURGERY    . COLOSTOMY CLOSURE  04/19/2012   Procedure: COLOSTOMY CLOSURE;  Surgeon: Adin Hector, MD;  Location: [facility];  Service: General;  Laterality: N/A;  Laparotomy, Resection and Closure of Colostomy  . COLOSTOMY TAKEDOWN N/A 04/12/2016   Procedure: [place] TAKEDOWN;  Surgeon: Johnathan Hausen, MD;  Location: [facility];  Service: General;  Laterality: N/A;  . INCONTINENCE SURGERY    . LAPAROTOMY  10/04/2011, colostomy also   Procedure: EXPLORATORY LAPAROTOMY;  Surgeon: Adin Hector, MD;  Location: [facility];  Service: General;  Laterality: N/A;  left partial colectomy with colostomy  . LAPAROTOMY  04/19/2012   Procedure: EXPLORATORY LAPAROTOMY;  Surgeon: Adin Hector, MD;  Location: [facility];  Service: General;  Laterality: N/A;  . LAPAROTOMY N/A  11/29/2015   Procedure: EXPLORATORY LAPAROTOMY; SUBTOTAL COLECTOMY WITH HARTMAN PROCEDURE AND END COLOSTOMY;  Surgeon: Johnathan Hausen, MD;  Location: [facility];  Service: General;  Laterality: N/A;  . TUBAL LIGATION    . VENTRAL HERNIA REPAIR  04/19/2012   Procedure: HERNIA REPAIR VENTRAL ADULT;  Surgeon: Adin Hector, MD;  Location: [facility];  Service: General;  Laterality: N/A;     OB History    Gravida      Para      Term      Preterm      AB      Living  5     SAB      TAB      Ectopic       Multiple      Live Births              Family History  Problem Relation Age of Onset  . Heart disease Mother   . Hypertension Mother   . Cancer Sister        sinus  . Diabetes Brother   . Heart disease Brother   . Thyroid disease Brother   . Cancer Sister        abdominal ?  . Thyroid disease Sister   . Cancer Other        GE junction adenocarcinoma  . Emphysema Father   . Stroke Father   . Hypertension Father   . Heart disease Father   . COPD Sister   . Thyroid disease Sister   . Thyroid disease Sister   . Diabetes Brother   . Thyroid disease Brother   . Thyroid disease Brother   . Hypertension Brother   . Post-traumatic stress disorder Brother   . COPD Brother   . Thyroid disease Brother   . Thyroid disease Brother   . Hypertension Brother   . Transient ischemic attack Brother     Social History   Tobacco Use  . Smoking status: Former Smoker    Packs/day: 1.50    Years: 20.00    Pack years: 30.00    Types: Cigarettes    Quit date: 10/04/1998    Years since quitting: 21.3  . Smokeless tobacco: Never Used  Vaping Use  . Vaping Use: Never used  Substance Use Topics  . Alcohol use: No  . Drug use: No  Patient states she lives of her son and his daughters.  Home Medications Prior to Admission medications   Medication Sig Start Date End Date Taking? Authorizing Provider  albuterol (PROVENTIL) (2.5 MG/3ML) 0.083% nebulizer solution Take 3 mLs (2.5 mg total) by nebulization every 6 (six) hours as needed for wheezing or shortness of breath. 07/25/19   Evelina Dun A, FNP  albuterol (VENTOLIN HFA) 108 (90 Base) MCG/ACT inhaler Inhale 2 puffs into the lungs every 6 (six) hours as needed for wheezing or shortness of breath.     [provider]  amLODipine-olmesartan (AZOR) 10-40 MG tablet Take 1 tablet by mouth daily. 01/16/20   [name], FNP  Aspirin-Salicylamide-Caffeine (BC HEADACHE POWDER PO) Take 1 Dose by mouth daily as needed.    [provider]  atorvastatin (LIPITOR) 20 MG tablet Take 1 tablet (20 mg total) by mouth daily. Patient not taking: Reported on 11/05/2019 07/18/19 07/17/20  [name], FNP  cephALEXin (KEFLEX) 500 MG capsule Take 1 capsule (500 mg total) by mouth 3 (three) times daily for 21 doses. 02/20/20 02/27/20  Rolland Porter, MD  DULoxetine (CYMBALTA) 60 MG capsule Take 1 capsule (60 mg total) by mouth daily. 11/05/19   Hawks, Alyse Low A, FNP  fluticasone furoate-vilanterol (BREO ELLIPTA) 200-25 MCG/INH AEPB Inhale 1 puff into the lungs daily. 01/16/20   Evelina Dun A, FNP  Fluticasone-Salmeterol (ADVAIR) 100-50 MCG/DOSE AEPB Inhale 1 puff into the lungs 2 (two) times daily. 02/09/20   [name], FNP  hydrOXYzine (VISTARIL) 25 MG capsule Take 1 capsule (25 mg total) by mouth 3 (three) times daily as needed. 01/16/20   [name], FNP  levothyroxine (SYNTHROID) 25 MCG tablet Take 1 tablet (25 mcg total) by mouth daily before breakfast. 11/05/19   Evelina Dun A, FNP  ondansetron (ZOFRAN) 4 MG tablet Take 1 tablet (4 mg total) by mouth every 8 (eight) hours as needed for nausea. 02/03/20   Evelina Dun A, FNP  rOPINIRole (REQUIP) 2 MG tablet Take 1 tablet (2 mg total) by mouth at bedtime. Patient not taking: Reported on 11/05/2019 07/21/19   Evelina Dun A, FNP  temazepam (RESTORIL) 15 MG capsule Take 1 capsule (15 mg total) by mouth at bedtime. 01/16/20   [name], FNP  traMADol (ULTRAM) 50 MG tablet Take 2 tablets (100 mg total) by mouth every 8 (eight) hours as needed (pain). 01/16/20   [name], FNP  Vitamin D, Ergocalciferol, (DRISDOL) 1.25 MG (50000 UT) CAPS capsule TAKE 1 CAPSULE (50000 UNITS) BY MOUTH EVERY 7 DAYS Patient taking differently: Take 50,000 Units by mouth every 7 (seven) days. TAKE 1 CAPSULE (50000 UNITS) BY MOUTH EVERY 7 DAYS 04/04/19   [name], FNP    Allergies    Toradol [ketorolac tromethamine], Butalbital-apap-caffeine, Demerol, Esgic  [butalbital-apap-caffeine], and Iodine  Review of Systems   Review of Systems  All other systems reviewed and are negative.   Physical Exam Updated Vital Signs BP (!) 140/56   Pulse 78   Temp 98.5 F (36.9 C) (Oral)   Resp 17   Ht 5' 1"  (1.549 m)   Wt 51.3 kg   SpO2 98%   BMI 21.37 kg/m   Physical Exam Vitals and nursing note reviewed.  Constitutional:      General: She is not in acute distress.    Appearance: Normal appearance. She is normal weight.     Comments: Sleeping, easily awakened  HENT:     Head: Normocephalic and atraumatic.     Right Ear: External ear normal.     Left Ear: External ear normal.  Eyes:     Extraocular Movements: Extraocular movements intact.     Conjunctiva/sclera: Conjunctivae normal.  Cardiovascular:     Rate and Rhythm: Normal rate and regular rhythm.     Pulses: Normal pulses.     Heart sounds: Normal heart sounds. No murmur heard.   Pulmonary:     Effort: Pulmonary effort is normal. No respiratory distress.     Breath sounds: Normal breath sounds.  Abdominal:     General: Abdomen is flat. Bowel sounds are normal. There is no distension.     Palpations: Abdomen is soft.  Musculoskeletal:        General: Normal range of motion.     Cervical back: Normal range of motion.  Skin:    General: Skin is warm and dry.     Coloration: Skin is not pale.  Neurological:     General: No focal deficit present.     Comments: Patient states is 73, but she states biting is the president. When asked  what month it is she starts crying.  Psychiatric:        Mood and Affect: Affect is labile.        Speech: Speech is delayed.        Behavior: Behavior is slowed.     ED Results / Procedures / Treatments   Labs (all labs ordered are listed, but only abnormal results are displayed) Results for orders placed or performed during the hospital encounter of 02/19/20  Comprehensive metabolic panel  Result Value Ref Range   Sodium 145 135 - 145  mmol/L   Potassium 3.6 3.5 - 5.1 mmol/L   Chloride 105 98 - 111 mmol/L   CO2 23 22 - 32 mmol/L   Glucose, Bld 78 70 - 99 mg/dL   BUN 19 8 - 23 mg/dL   Creatinine, Ser 0.96 0.44 - 1.00 mg/dL   Calcium 9.4 8.9 - 10.3 mg/dL   Total Protein 7.8 6.5 - 8.1 g/dL   Albumin 4.3 3.5 - 5.0 g/dL   AST 24 15 - 41 U/L   ALT 17 0 - 44 U/L   Alkaline Phosphatase 75 38 - 126 U/L   Total Bilirubin 0.2 (L) 0.3 - 1.2 mg/dL   GFR calc non Af Amer 60 (L) >60 mL/min   GFR calc Af Amer >60 >60 mL/min   Anion gap 17 (H) 5 - 15  CBC  Result Value Ref Range   WBC 6.8 4.0 - 10.5 K/uL   RBC 5.29 (H) 3.87 - 5.11 MIL/uL   Hemoglobin 14.0 12.0 - 15.0 g/dL   HCT 43.7 36 - 46 %   MCV 82.6 80.0 - 100.0 fL   MCH 26.5 26.0 - 34.0 pg   MCHC 32.0 30.0 - 36.0 g/dL   RDW 18.1 (H) 11.5 - 15.5 %   Platelets 373 150 - 400 K/uL   nRBC 0.0 0.0 - 0.2 %  Ethanol  Result Value Ref Range   Alcohol, Ethyl (B) 273 (H) <10 mg/dL  Ammonia  Result Value Ref Range   Ammonia 34 9 - 35 umol/L  Urine rapid drug screen (hosp performed)  Result Value Ref Range   Opiates NONE DETECTED NONE DETECTED   Cocaine NONE DETECTED NONE DETECTED   Benzodiazepines POSITIVE (A) NONE DETECTED   Amphetamines NONE DETECTED NONE DETECTED   Tetrahydrocannabinol NONE DETECTED NONE DETECTED   Barbiturates NONE DETECTED NONE DETECTED  Urinalysis, Routine w reflex microscopic Urine, Clean Catch  Result Value Ref Range   Color, Urine YELLOW YELLOW   APPearance CLEAR CLEAR   Specific Gravity, Urine 1.010 1.005 - 1.030   pH 7.0 5.0 - 8.0   Glucose, UA 50 (A) NEGATIVE mg/dL   Hgb urine dipstick NEGATIVE NEGATIVE   Bilirubin Urine NEGATIVE NEGATIVE   Ketones, ur NEGATIVE NEGATIVE mg/dL   Protein, ur NEGATIVE NEGATIVE mg/dL   Nitrite NEGATIVE NEGATIVE   Leukocytes,Ua LARGE (A) NEGATIVE   RBC / HPF 0-5 0 - 5 RBC/hpf   WBC, UA >50 (H) 0 - 5 WBC/hpf   Bacteria, UA NONE SEEN NONE SEEN   Squamous Epithelial / LPF 0-5 0 - 5  CK  Result Value Ref  Range   Total CK 53 38.0 - 234.0 U/L  Lactic acid, plasma  Result Value Ref Range   Lactic Acid, Venous 3.6 (HH) 0.5 - 1.9 mmol/L  Lactic acid, plasma  Result Value Ref Range   Lactic Acid, Venous 3.3 (HH) 0.5 - 1.9 mmol/L  CBG monitoring, ED  Result Value  Ref Range   Glucose-Capillary 80 70 - 99 mg/dL   Laboratory interpretation all normal except low glucose without being hypoglycemic, alcohol intoxication, elevated lactic acid, elevated anion gap with normal bicarb, lactic acidosis   EKG None  Radiology No results found.  Procedures Procedures (including critical care time)  Medications Ordered in ED Medications  dextrose 5 %-0.9 % sodium chloride infusion ( Intravenous Stopped 02/20/20 0329)  cephALEXin (KEFLEX) capsule 500 mg (has no administration in time range)  dextrose 5 % and 0.9% NaCl 5-0.9 % bolus 500 mL (0 mLs Intravenous Stopped 02/20/20 0046)    ED Course  I have reviewed the triage vital signs and the nursing notes.  Pertinent labs & imaging results that were available during my care of the patient were reviewed by me and considered in my medical decision making (see chart for details).    MDM Rules/Calculators/A&P                           After reviewing patient's C met and noted her glucose was borderline low and she is not able to orally intake anything right now she was started on D5 normal saline bolus and drip. I added additional lab work including a lactic acid and ammonia level due to her history of liver disease. Urinalysis including UDS was ordered to see if she is taking anything other than ingesting alcohol.  Patient possibly has UTI, she was started on antibiotics and urine culture was sent.  She did have a positive lactic acidosis however it did improve with IV fluids.  Recheck at 3:20 AM patient is easily awakened.  When I asked her if she remembers what happens tonight she states she has.  When I asked her what happened she states "I got  drunk".  She states she is ashamed.  She states she never used to drink however she has restarted recently but cannot tell me how long ago that was.  She states she has not gotten help to quit drinking.  At this point I think patient is able to be discharged home.  Review of the [place] shows patient gets #90 temazepam every 3 months, last filled July 16.  She also gets #180 tramadol 50 mg tablets monthly, last filled August 8.  Final Clinical Impression(s) / ED Diagnoses Final diagnoses:  Altered mental status, unspecified altered mental status type  Alcoholic intoxication without complication ([place])  Urinary tract infection without hematuria, site unspecified    Rx / DC Orders ED Discharge Orders         Ordered    cephALEXin (KEFLEX) 500 MG capsule  3 times daily        02/20/20 0329         Plan discharge  Rolland Porter, MD, Barbette Or, MD 02/20/20 [place], [place], MD 02/20/20 [phone]

## 2020-02-20 NOTE — ED Notes (Signed)
Pt has consumed 2 cups of water- given 3rd cup of water per request.

## 2020-02-21 LAB — URINE CULTURE

## 2020-04-24 ENCOUNTER — Other Ambulatory Visit: Payer: Self-pay | Admitting: Nurse Practitioner

## 2020-04-24 MED ORDER — CEPHALEXIN 500 MG PO CAPS: 500.0000 mg | ORAL_CAPSULE | Freq: Two times a day (BID) | ORAL | 0 refills | Status: DC

## 2020-04-26 ENCOUNTER — Telehealth: Payer: Self-pay

## 2020-04-26 ENCOUNTER — Ambulatory Visit: Payer: Medicare Other

## 2020-04-27 ENCOUNTER — Encounter: Payer: Self-pay | Admitting: Family

## 2020-04-27 ENCOUNTER — Ambulatory Visit (INDEPENDENT_AMBULATORY_CARE_PROVIDER_SITE_OTHER): Payer: Medicare Other | Admitting: Family

## 2020-04-27 DIAGNOSIS — F419 Anxiety disorder, unspecified: Secondary | ICD-10-CM | POA: Diagnosis not present

## 2020-04-27 DIAGNOSIS — I10 Essential (primary) hypertension: Secondary | ICD-10-CM

## 2020-04-27 DIAGNOSIS — G4700 Insomnia, unspecified: Secondary | ICD-10-CM

## 2020-04-27 DIAGNOSIS — K219 Gastro-esophageal reflux disease without esophagitis: Secondary | ICD-10-CM

## 2020-04-27 DIAGNOSIS — G8929 Other chronic pain: Secondary | ICD-10-CM

## 2020-04-27 DIAGNOSIS — F321 Major depressive disorder, single episode, moderate: Secondary | ICD-10-CM

## 2020-04-27 DIAGNOSIS — E785 Hyperlipidemia, unspecified: Secondary | ICD-10-CM

## 2020-04-27 DIAGNOSIS — Z0289 Encounter for other administrative examinations: Secondary | ICD-10-CM | POA: Diagnosis not present

## 2020-04-27 DIAGNOSIS — G894 Chronic pain syndrome: Secondary | ICD-10-CM

## 2020-04-27 DIAGNOSIS — E039 Hypothyroidism, unspecified: Secondary | ICD-10-CM

## 2020-04-27 DIAGNOSIS — J42 Unspecified chronic bronchitis: Secondary | ICD-10-CM | POA: Diagnosis not present

## 2020-04-27 DIAGNOSIS — Z79899 Other long term (current) drug therapy: Secondary | ICD-10-CM

## 2020-04-27 DIAGNOSIS — K5909 Other constipation: Secondary | ICD-10-CM

## 2020-04-27 DIAGNOSIS — M546 Pain in thoracic spine: Secondary | ICD-10-CM | POA: Diagnosis not present

## 2020-04-27 DIAGNOSIS — F1120 Opioid dependence, uncomplicated: Secondary | ICD-10-CM

## 2020-04-27 DIAGNOSIS — A084 Viral intestinal infection, unspecified: Secondary | ICD-10-CM

## 2020-04-27 MED ORDER — ONDANSETRON HCL 4 MG PO TABS: 4.0000 mg | ORAL_TABLET | Freq: Three times a day (TID) | ORAL | 3 refills | Status: DC | PRN

## 2020-04-27 MED ORDER — DULOXETINE HCL 60 MG PO CPEP: 60.0000 mg | ORAL_CAPSULE | Freq: Every day | ORAL | 3 refills | Status: DC

## 2020-04-27 MED ORDER — TRAMADOL HCL 50 MG PO TABS: 100.0000 mg | ORAL_TABLET | Freq: Three times a day (TID) | ORAL | 2 refills | Status: DC | PRN

## 2020-04-27 MED ORDER — TEMAZEPAM 15 MG PO CAPS: 15.0000 mg | ORAL_CAPSULE | Freq: Every day | ORAL | 2 refills | Status: DC

## 2020-04-27 MED ORDER — LEVOTHYROXINE SODIUM 25 MCG PO TABS: 25.0000 ug | ORAL_TABLET | Freq: Every day | ORAL | 3 refills | Status: DC

## 2020-04-27 NOTE — Progress Notes (Signed)
Virtual Visit via telephone Note Due to COVID-19 pandemic this visit was conducted virtually. This visit type was conducted due to national recommendations for restrictions regarding the COVID-19 Pandemic (e.g. social distancing, sheltering in place) in an effort to limit this patient's exposure and mitigate transmission in our community. All issues noted in this document were discussed and addressed.  A physical exam was not performed with this format.  I connected with Stacy Dalton on 04/27/20 at 9:38  AM by telephone and verified that I am speaking with the correct person using two identifiers. Stacy Dalton is currently located at home and no one is currently with her during visit. The provider, Evelina Dun, FNP is located in their office at time of visit.  I discussed the limitations, risks, security and privacy concerns of performing an evaluation and management service by telephone and the availability of in person appointments. I also discussed with the patient that there may be a patient responsible charge related to this service. The patient expressed understanding and agreed to proceed.   History and Present Illness:  Ptcallsthe office today for chronic follow up. PT has pulmonologistsfor COPD and chronic cough, but states she has not seen them since COVID. I have given her their phone number to call and make follow up appt.  Hyperlipidemia This is a chronic problem. The current episode started more than 1 year ago. The problem is uncontrolled. Exacerbating diseases include hypothyroidism. Pertinent negatives include no shortness of breath. Current antihyperlipidemic treatment includes statins. The current treatment provides moderate improvement of lipids. Risk factors for coronary artery disease include dyslipidemia, hypertension, a sedentary lifestyle and post-menopausal.  Hypertension This is a chronic problem. The current episode started more than 1 year ago. The  problem has been resolved since onset. Associated symptoms include anxiety, malaise/fatigue and palpitations. Pertinent negatives include no peripheral edema or shortness of breath. Risk factors for coronary artery disease include dyslipidemia and sedentary lifestyle. The current treatment provides moderate improvement. There is no history of CVA or heart failure. Identifiable causes of hypertension include a thyroid problem.  Anxiety Presents for follow-up visit. Symptoms include depressed mood, excessive worry, insomnia, irritability, nervous/anxious behavior, palpitations and restlessness. Patient reports no shortness of breath. Symptoms occur most days. The severity of symptoms is moderate.    Thyroid Problem Presents for follow-up visit. Symptoms include anxiety, constipation, depressed mood, fatigue and palpitations. The symptoms have been stable. Her past medical history is significant for hyperlipidemia. There is no history of heart failure.  Constipation This is a chronic problem. The current episode started more than 1 year ago. The problem has been waxing and waning since onset. Her stool frequency is 2 to 3 times per week. She has tried laxatives for the symptoms. The treatment provided moderate relief.  COPD Using Breo daily. States her breathing is stable.   Review of Systems  Constitutional: Positive for fatigue, irritability and malaise/fatigue.  Respiratory: Negative for shortness of breath.   Cardiovascular: Positive for palpitations.  Gastrointestinal: Positive for constipation.  Psychiatric/Behavioral: The patient is nervous/anxious and has insomnia.   All other systems reviewed and are negative.    Observations/Objective: No SOB or distress noted   Assessment and Plan: Stacy Dalton comes in today with chief complaint of No chief complaint on file.   Diagnosis and orders addressed:  1. Primary hypertension - CMP14+EGFR; Future - CBC with  Differential/Platelet; Future  2. Chronic bronchitis, unspecified chronic bronchitis type ([place]) - CMP14+EGFR; Future - CBC with  Differential/Platelet; Future  3. Gastroesophageal reflux disease without esophagitis - CMP14+EGFR; Future - CBC with Differential/Platelet; Future  4. Chronic constipation - CMP14+EGFR; Future - CBC with Differential/Platelet; Future  5. Hypothyroidism, unspecified type - CMP14+EGFR; Future - CBC with Differential/Platelet; Future - TSH; Future - levothyroxine (SYNTHROID) 25 MCG tablet; Take 1 tablet (25 mcg total) by mouth daily before breakfast.  Dispense: 90 tablet; Refill: 3  6. Anxiety - CMP14+EGFR; Future - CBC with Differential/Platelet; Future - DULoxetine (CYMBALTA) 60 MG capsule; Take 1 capsule (60 mg total) by mouth daily.  Dispense: 90 capsule; Refill: 3  7. Chronic bilateral thoracic back pain - CMP14+EGFR; Future - CBC with Differential/Platelet; Future - traMADol (ULTRAM) 50 MG tablet; Take 2 tablets (100 mg total) by mouth every 8 (eight) hours as needed (pain).  Dispense: 180 tablet; Refill: 2  8. Current moderate episode of major depressive disorder, unspecified whether recurrent ([place]) - CMP14+EGFR; Future - CBC with Differential/Platelet; Future - DULoxetine (CYMBALTA) 60 MG capsule; Take 1 capsule (60 mg total) by mouth daily.  Dispense: 90 capsule; Refill: 3  9. Hyperlipidemia, unspecified hyperlipidemia type - CMP14+EGFR; Future - CBC with Differential/Platelet; Future  10. Insomnia, unspecified type - CMP14+EGFR; Future - CBC with Differential/Platelet; Future - DULoxetine (CYMBALTA) 60 MG capsule; Take 1 capsule (60 mg total) by mouth daily.  Dispense: 90 capsule; Refill: 3 - temazepam (RESTORIL) 15 MG capsule; Take 1 capsule (15 mg total) by mouth at bedtime.  Dispense: 90 capsule; Refill: 2  11. Pain medication agreement signed - CMP14+EGFR; Future - CBC with Differential/Platelet; Future - traMADol (ULTRAM) 50 MG  tablet; Take 2 tablets (100 mg total) by mouth every 8 (eight) hours as needed (pain).  Dispense: 180 tablet; Refill: 2  12. Polypharmacy - CMP14+EGFR; Future - CBC with Differential/Platelet; Future  13. Viral gastroenteritis - CMP14+EGFR; Future - CBC with Differential/Platelet; Future - ondansetron (ZOFRAN) 4 MG tablet; Take 1 tablet (4 mg total) by mouth every 8 (eight) hours as needed for nausea.  Dispense: 60 tablet; Refill: 3  14. Uncomplicated opioid dependence ([place]) - CMP14+EGFR; Future - CBC with Differential/Platelet; Future - traMADol (ULTRAM) 50 MG tablet; Take 2 tablets (100 mg total) by mouth every 8 (eight) hours as needed (pain).  Dispense: 180 tablet; Refill: 2  15. Chronic pain syndrome - CMP14+EGFR; Future - CBC with Differential/Platelet; Future - traMADol (ULTRAM) 50 MG tablet; Take 2 tablets (100 mg total) by mouth every 8 (eight) hours as needed (pain).  Dispense: 180 tablet; Refill: 2   Labs pending Patient reviewed in [place] controlled database, no flags noted. Contract and drug screen are up to date.  Health Maintenance reviewed Diet and exercise encouraged  Follow up plan: 3 months      I discussed the assessment and treatment plan with the patient. The patient was provided an opportunity to ask questions and all were answered. The patient agreed with the plan and demonstrated an understanding of the instructions.   The patient was advised to call back or seek an in-person evaluation if the symptoms worsen or if the condition fails to improve as anticipated.  The above assessment and management plan was discussed with the patient. The patient verbalized understanding of and has agreed to the management plan. Patient is aware to call the clinic if symptoms persist or worsen. Patient is aware when to return to the clinic for a follow-up visit. Patient educated on when it is appropriate to go to the emergency department.   Time call ended:  9:59  AM   I  provided 21 minutes of non-face-to-face time during this encounter.    Evelina Dun, FNP

## 2020-05-16 ENCOUNTER — Other Ambulatory Visit (INDEPENDENT_AMBULATORY_CARE_PROVIDER_SITE_OTHER): Payer: Medicare Other | Admitting: Family

## 2020-05-16 DIAGNOSIS — N390 Urinary tract infection, site not specified: Secondary | ICD-10-CM

## 2020-05-16 MED ORDER — SULFAMETHOXAZOLE-TRIMETHOPRIM 800-160 MG PO TABS: 1.0000 | ORAL_TABLET | Freq: Two times a day (BID) | ORAL | 0 refills | Status: DC

## 2020-05-16 NOTE — Progress Notes (Signed)
   Virtual Visit via telephone Note Due to COVID-19 pandemic this visit was conducted virtually. This visit type was conducted due to national recommendations for restrictions regarding the COVID-19 Pandemic (e.g. social distancing, sheltering in place) in an effort to limit this patient's exposure and mitigate transmission in our community. All issues noted in this document were discussed and addressed.  A physical exam was not performed with this format.  I connected with Stacy Dalton on 05/16/20 at 3:33 pm by telephone and verified that I am speaking with the correct person using two identifiers. Stacy Dalton is currently located at home and no one is currently with her during visit. The provider, Evelina Dun, FNP is located in their office at time of visit.  I discussed the limitations, risks, security and privacy concerns of performing an evaluation and management service by telephone and the availability of in person appointments. I also discussed with the patient that there may be a patient responsible charge related to this service. The patient expressed understanding and agreed to proceed.   History and Present Illness: PT calls the office today with recurrent UTI symptoms. She just completed Keflex, but states her symptoms never resolved.  Urinary Tract Infection  This is a recurrent problem. The current episode started in the past 7 days. The problem occurs every urination. The problem has been gradually worsening. The quality of the pain is described as burning. The pain is at a severity of 8/10. The pain is moderate. Associated symptoms include flank pain, frequency, hesitancy, nausea and urgency. Pertinent negatives include no hematuria. She has tried antibiotics for the symptoms. The treatment provided no relief.      Review of Systems  Gastrointestinal: Positive for nausea.  Genitourinary: Positive for flank pain, frequency, hesitancy and urgency. Negative for  hematuria.  All other systems reviewed and are negative.    Observations/Objective: No SOB or distress noted   Assessment and Plan: 1. Recurrent UTI Force fluids AZO over the counter X2 days RTO if symptoms worsen or do not improve  - sulfamethoxazole-trimethoprim (BACTRIM DS) 800-160 MG tablet; Take 1 tablet by mouth 2 (two) times daily.  Dispense: 14 tablet; Refill: 0      I discussed the assessment and treatment plan with the patient. The patient was provided an opportunity to ask questions and all were answered. The patient agreed with the plan and demonstrated an understanding of the instructions.   The patient was advised to call back or seek an in-person evaluation if the symptoms worsen or if the condition fails to improve as anticipated.  The above assessment and management plan was discussed with the patient. The patient verbalized understanding of and has agreed to the management plan. Patient is aware to call the clinic if symptoms persist or worsen. Patient is aware when to return to the clinic for a follow-up visit. Patient educated on when it is appropriate to go to the emergency department.   Time call ended:  3:45 pm  I provided 12 minutes of non-face-to-face time during this encounter.    Evelina Dun, FNP   pm:

## 2020-05-19 ENCOUNTER — Telehealth: Payer: Self-pay

## 2020-05-19 NOTE — Telephone Encounter (Signed)
Ok

## 2020-06-04 ENCOUNTER — Other Ambulatory Visit: Payer: Medicare Other

## 2020-06-04 DIAGNOSIS — G4700 Insomnia, unspecified: Secondary | ICD-10-CM | POA: Diagnosis not present

## 2020-06-04 DIAGNOSIS — Z79899 Other long term (current) drug therapy: Secondary | ICD-10-CM | POA: Diagnosis not present

## 2020-06-04 DIAGNOSIS — G8929 Other chronic pain: Secondary | ICD-10-CM | POA: Diagnosis not present

## 2020-06-04 DIAGNOSIS — J42 Unspecified chronic bronchitis: Secondary | ICD-10-CM | POA: Diagnosis not present

## 2020-06-04 DIAGNOSIS — K219 Gastro-esophageal reflux disease without esophagitis: Secondary | ICD-10-CM | POA: Diagnosis not present

## 2020-06-04 DIAGNOSIS — F419 Anxiety disorder, unspecified: Secondary | ICD-10-CM | POA: Diagnosis not present

## 2020-06-04 DIAGNOSIS — E785 Hyperlipidemia, unspecified: Secondary | ICD-10-CM | POA: Diagnosis not present

## 2020-06-04 DIAGNOSIS — I10 Essential (primary) hypertension: Secondary | ICD-10-CM | POA: Diagnosis not present

## 2020-06-04 DIAGNOSIS — F321 Major depressive disorder, single episode, moderate: Secondary | ICD-10-CM | POA: Diagnosis not present

## 2020-06-04 DIAGNOSIS — M546 Pain in thoracic spine: Secondary | ICD-10-CM | POA: Diagnosis not present

## 2020-06-04 DIAGNOSIS — E039 Hypothyroidism, unspecified: Secondary | ICD-10-CM | POA: Diagnosis not present

## 2020-06-04 DIAGNOSIS — K5909 Other constipation: Secondary | ICD-10-CM | POA: Diagnosis not present

## 2020-06-05 LAB — CBC WITH DIFFERENTIAL/PLATELET
Basophils Absolute: 0.1 10*3/uL (ref 0.0–0.2)
Basos: 1 %
EOS (ABSOLUTE): 0.2 10*3/uL (ref 0.0–0.4)
Eos: 4 %
Hematocrit: 38.7 % (ref 34.0–46.6)
Hemoglobin: 12.7 g/dL (ref 11.1–15.9)
Immature Grans (Abs): 0 10*3/uL (ref 0.0–0.1)
Immature Granulocytes: 1 %
Lymphocytes Absolute: 1.7 10*3/uL (ref 0.7–3.1)
Lymphs: 30 %
MCH: 28.2 pg (ref 26.6–33.0)
MCHC: 32.8 g/dL (ref 31.5–35.7)
MCV: 86 fL (ref 79–97)
Monocytes Absolute: 0.9 10*3/uL (ref 0.1–0.9)
Monocytes: 16 %
Neutrophils Absolute: 2.8 10*3/uL (ref 1.4–7.0)
Neutrophils: 48 %
Platelets: 292 10*3/uL (ref 150–450)
RBC: 4.51 x10E6/uL (ref 3.77–5.28)
RDW: 14.9 % (ref 11.7–15.4)
WBC: 5.8 10*3/uL (ref 3.4–10.8)

## 2020-06-05 LAB — CMP14+EGFR
ALT: 11 IU/L (ref 0–32)
AST: 16 IU/L (ref 0–40)
Albumin/Globulin Ratio: 1.9 (ref 1.2–2.2)
Albumin: 4.3 g/dL (ref 3.7–4.7)
Alkaline Phosphatase: 85 IU/L (ref 44–121)
BUN/Creatinine Ratio: 12 (ref 12–28)
BUN: 16 mg/dL (ref 8–27)
Bilirubin Total: 0.3 mg/dL (ref 0.0–1.2)
CO2: 24 mmol/L (ref 20–29)
Calcium: 9.9 mg/dL (ref 8.7–10.3)
Chloride: 98 mmol/L (ref 96–106)
Creatinine, Ser: 1.35 mg/dL — ABNORMAL HIGH (ref 0.57–1.00)
GFR calc Af Amer: 46 mL/min/{1.73_m2} — ABNORMAL LOW (ref >59)
GFR calc non Af Amer: 40 mL/min/{1.73_m2} — ABNORMAL LOW (ref >59)
Globulin, Total: 2.3 g/dL (ref 1.5–4.5)
Glucose: 102 mg/dL — ABNORMAL HIGH (ref 65–99)
Potassium: 4.2 mmol/L (ref 3.5–5.2)
Sodium: 139 mmol/L (ref 134–144)
Total Protein: 6.6 g/dL (ref 6.0–8.5)

## 2020-06-05 LAB — TSH: TSH: 2.92 u[IU]/mL (ref 0.450–4.500)

## 2020-06-14 ENCOUNTER — Other Ambulatory Visit: Payer: Self-pay

## 2020-06-14 ENCOUNTER — Other Ambulatory Visit: Payer: Medicare Other

## 2020-06-14 DIAGNOSIS — R7989 Other specified abnormal findings of blood chemistry: Secondary | ICD-10-CM | POA: Diagnosis not present

## 2020-06-15 LAB — CMP14+EGFR
ALT: 13 IU/L (ref 0–32)
AST: 17 IU/L (ref 0–40)
Albumin/Globulin Ratio: 1.9 (ref 1.2–2.2)
Albumin: 4.6 g/dL (ref 3.7–4.7)
Alkaline Phosphatase: 91 IU/L (ref 44–121)
BUN/Creatinine Ratio: 8 — ABNORMAL LOW (ref 12–28)
BUN: 13 mg/dL (ref 8–27)
Bilirubin Total: 0.2 mg/dL (ref 0.0–1.2)
CO2: 26 mmol/L (ref 20–29)
Calcium: 10.2 mg/dL (ref 8.7–10.3)
Chloride: 95 mmol/L — ABNORMAL LOW (ref 96–106)
Creatinine, Ser: 1.7 mg/dL — ABNORMAL HIGH (ref 0.57–1.00)
GFR calc Af Amer: 34 mL/min/{1.73_m2} — ABNORMAL LOW (ref >59)
GFR calc non Af Amer: 30 mL/min/{1.73_m2} — ABNORMAL LOW (ref >59)
Globulin, Total: 2.4 g/dL (ref 1.5–4.5)
Glucose: 113 mg/dL — ABNORMAL HIGH (ref 65–99)
Potassium: 4.2 mmol/L (ref 3.5–5.2)
Sodium: 137 mmol/L (ref 134–144)
Total Protein: 7 g/dL (ref 6.0–8.5)

## 2020-06-16 ENCOUNTER — Other Ambulatory Visit: Payer: Self-pay | Admitting: *Deleted

## 2020-06-16 DIAGNOSIS — R7989 Other specified abnormal findings of blood chemistry: Secondary | ICD-10-CM

## 2020-06-18 ENCOUNTER — Other Ambulatory Visit: Payer: Self-pay

## 2020-06-18 ENCOUNTER — Encounter (HOSPITAL_COMMUNITY): Payer: Self-pay | Admitting: Emergency Medicine

## 2020-06-18 ENCOUNTER — Emergency Department (HOSPITAL_COMMUNITY)
Admission: EM | Admit: 2020-06-18 | Discharge: 2020-06-19 | Disposition: A | Discharge status: Home / Self Care | Payer: Medicare Other | Attending: Emergency Medicine | Admitting: Emergency Medicine

## 2020-06-18 DIAGNOSIS — J441 Chronic obstructive pulmonary disease with (acute) exacerbation: Secondary | ICD-10-CM | POA: Insufficient documentation

## 2020-06-18 DIAGNOSIS — F10929 Alcohol use, unspecified with intoxication, unspecified: Secondary | ICD-10-CM | POA: Diagnosis not present

## 2020-06-18 DIAGNOSIS — Z87891 Personal history of nicotine dependence: Secondary | ICD-10-CM | POA: Insufficient documentation

## 2020-06-18 DIAGNOSIS — Z79899 Other long term (current) drug therapy: Secondary | ICD-10-CM | POA: Insufficient documentation

## 2020-06-18 DIAGNOSIS — E039 Hypothyroidism, unspecified: Secondary | ICD-10-CM | POA: Diagnosis not present

## 2020-06-18 DIAGNOSIS — I10 Essential (primary) hypertension: Secondary | ICD-10-CM | POA: Insufficient documentation

## 2020-06-18 DIAGNOSIS — F10920 Alcohol use, unspecified with intoxication, uncomplicated: Secondary | ICD-10-CM

## 2020-06-18 DIAGNOSIS — N289 Disorder of kidney and ureter, unspecified: Secondary | ICD-10-CM | POA: Diagnosis not present

## 2020-06-18 NOTE — ED Notes (Signed)
Pt stated, "I had too much to drink tonight." Pt can't remember how much she drank and could not state how much she drinks daily. When asked about detox, pt stated, "I don't see the point because I'll just start drinking again."  Pt stated that liquor is her drink of choice, not wine or beer.

## 2020-06-18 NOTE — ED Notes (Addendum)
Pt provided with multiple cups of ice water. Pt tolerating well. Pt informed that we needed a urine sample when she's able to provide one. No other requests at this time. Will continue to monitor.

## 2020-06-18 NOTE — ED Provider Notes (Signed)
[facility] EMERGENCY DEPARTMENT Provider Note   CSN: [id number] Arrival date & time: 06/18/20  2145     History Chief Complaint  Patient presents with  . Alcohol Intoxication    Stacy Dalton is a 69 y.o. female.  HPI Patient was at her home, running around outside naked when police were summoned by neighbors.  Apparently another family member was doing the same thing.  Patient was brought here for evaluation.  She has had prior admissions to this ED for alcohol intoxication and altered mental status.  Patient denies headache, back pain, neck pain.  She states she wants to drink some water.  She cannot recall what happened earlier today.  There are no other known modifying factors.    Past Medical History:  Diagnosis Date  . Chronic pain   . COPD (chronic obstructive pulmonary disease) ([place])    told has copd, no current inhaler use  . Cough   . Depression   . GERD (gastroesophageal reflux disease)   . Hypertension   . Hypothyroidism   . Insomnia   . Migraine   . Migraine   . Osteopenia   . Pain management   . Panic attacks   . Small bowel obstruction [facility])     Patient Active Problem List   Diagnosis Date Noted  . Multifocal pneumonia 09/19/2018  . Large bowel obstruction ([place])   . Small bowel obstruction ([place]) 07/20/2018  . Hypomagnesemia 07/20/2018  . Cough 03/14/2018  . Fatigue 03/14/2018  . Polypharmacy 02/23/2017  . Hypokalemia 02/23/2017  . Toxic metabolic encephalopathy 70/48/8891  . Anxiety 02/22/2017  . GERD (gastroesophageal reflux disease) 02/22/2017  . Seizure ([place])   . Pain medication agreement signed 10/10/2016  . Opioid dependence ([place]) 10/10/2016  . Status post colostomy takedown 04/12/2016  . Chronic constipation 12/03/2015  . Peritonitis with abscess of intestine ([place]) 11/29/2015  . COPD exacerbation ([place]) 09/20/2015  . Osteopenia 06/10/2014  . Vitamin D deficiency disease 06/10/2014  . Hypothyroidism   . Depression 11/18/2012  .  Insomnia 11/18/2012  . Chronic pain syndrome 11/18/2012  . HLD (hyperlipidemia) 09/01/2012  . S/P colostomy ([place]) 04/19/2012  . Normocytic anemia 10/15/2011  . Hypertension 10/13/2011  . Chronic back pain 10/13/2011  . COPD (chronic obstructive pulmonary disease) ([place]) 10/09/2011    Past Surgical History:  Procedure Laterality Date  . ABDOMINAL HYSTERECTOMY    . COLON SURGERY    . COLOSTOMY CLOSURE  04/19/2012   Procedure: COLOSTOMY CLOSURE;  Surgeon: Adin Hector, MD;  Location: [facility] ORS;  Service: General;  Laterality: N/A;  Laparotomy, Resection and Closure of Colostomy  . COLOSTOMY TAKEDOWN N/A 04/12/2016   Procedure: [place] TAKEDOWN;  Surgeon: Johnathan Hausen, MD;  Location: [facility];  Service: General;  Laterality: N/A;  . INCONTINENCE SURGERY    . LAPAROTOMY  10/04/2011, colostomy also   Procedure: EXPLORATORY LAPAROTOMY;  Surgeon: Adin Hector, MD;  Location: [facility];  Service: General;  Laterality: N/A;  left partial colectomy with colostomy  . LAPAROTOMY  04/19/2012   Procedure: EXPLORATORY LAPAROTOMY;  Surgeon: Adin Hector, MD;  Location: [facility];  Service: General;  Laterality: N/A;  . LAPAROTOMY N/A 11/29/2015   Procedure: EXPLORATORY LAPAROTOMY; SUBTOTAL COLECTOMY WITH HARTMAN PROCEDURE AND END COLOSTOMY;  Surgeon: Johnathan Hausen, MD;  Location: [facility];  Service: General;  Laterality: N/A;  . TUBAL LIGATION    . VENTRAL HERNIA REPAIR  04/19/2012   Procedure: HERNIA REPAIR VENTRAL ADULT;  Surgeon: Adin Hector, MD;  Location: [facility];  Service: General;  Laterality: N/A;     OB History    Gravida      Para      Term      Preterm      AB      Living  5     SAB      IAB      Ectopic      Multiple      Live Births              Family History  Problem Relation Age of Onset  . Heart disease Mother   . Hypertension Mother   . Cancer Sister        sinus  . Diabetes Brother   . Heart disease Brother   . Thyroid disease Brother   . Cancer  Sister        abdominal ?  . Thyroid disease Sister   . Cancer Other        GE junction adenocarcinoma  . Emphysema Father   . Stroke Father   . Hypertension Father   . Heart disease Father   . COPD Sister   . Thyroid disease Sister   . Thyroid disease Sister   . Diabetes Brother   . Thyroid disease Brother   . Thyroid disease Brother   . Hypertension Brother   . Post-traumatic stress disorder Brother   . COPD Brother   . Thyroid disease Brother   . Thyroid disease Brother   . Hypertension Brother   . Transient ischemic attack Brother     Social History   Tobacco Use  . Smoking status: Former Smoker    Packs/day: 1.50    Years: 20.00    Pack years: 30.00    Types: Cigarettes    Quit date: 10/04/1998    Years since quitting: 21.7  . Smokeless tobacco: Never Used  Vaping Use  . Vaping Use: Never used  Substance Use Topics  . Alcohol use: Yes  . Drug use: No    Home Medications Prior to Admission medications   Medication Sig Start Date End Date Taking? Authorizing Provider  albuterol (PROVENTIL) (2.5 MG/3ML) 0.083% nebulizer solution Take 3 mLs (2.5 mg total) by nebulization every 6 (six) hours as needed for wheezing or shortness of breath. 07/25/19   Evelina Dun A, FNP  albuterol (VENTOLIN HFA) 108 (90 Base) MCG/ACT inhaler Inhale 2 puffs into the lungs every 6 (six) hours as needed for wheezing or shortness of breath.     [provider]  amLODipine-olmesartan (AZOR) 10-40 MG tablet Take 1 tablet by mouth daily. 01/16/20   [name], FNP  Aspirin-Salicylamide-Caffeine (BC HEADACHE POWDER PO) Take 1 Dose by mouth daily as needed.    [provider]  atorvastatin (LIPITOR) 20 MG tablet Take 1 tablet (20 mg total) by mouth daily. 07/18/19 07/17/20  [name], FNP  DULoxetine (CYMBALTA) 60 MG capsule Take 1 capsule (60 mg total) by mouth daily. 04/27/20   Hawks, Alyse Low A, FNP  fluticasone furoate-vilanterol (BREO ELLIPTA) 200-25 MCG/INH AEPB  Inhale 1 puff into the lungs daily. 01/16/20   [name], FNP  hydrOXYzine (VISTARIL) 25 MG capsule Take 1 capsule (25 mg total) by mouth 3 (three) times daily as needed. 01/16/20   [name], FNP  levothyroxine (SYNTHROID) 25 MCG tablet Take 1 tablet (25 mcg total) by mouth daily before breakfast. 04/27/20   [name], FNP  ondansetron [facility]) 4  MG tablet Take 1 tablet (4 mg total) by mouth every 8 (eight) hours as needed for nausea. 04/27/20   Evelina Dun A, FNP  sulfamethoxazole-trimethoprim (BACTRIM DS) 800-160 MG tablet Take 1 tablet by mouth 2 (two) times daily. 05/16/20   Evelina Dun A, FNP  temazepam (RESTORIL) 15 MG capsule Take 1 capsule (15 mg total) by mouth at bedtime. 04/27/20   [name], FNP  traMADol (ULTRAM) 50 MG tablet Take 2 tablets (100 mg total) by mouth every 8 (eight) hours as needed (pain). 04/27/20   [name], FNP  Vitamin D, Ergocalciferol, (DRISDOL) 1.25 MG (50000 UT) CAPS capsule TAKE 1 CAPSULE (50000 UNITS) BY MOUTH EVERY 7 DAYS Patient taking differently: Take 50,000 Units by mouth every 7 (seven) days. TAKE 1 CAPSULE (50000 UNITS) BY MOUTH EVERY 7 DAYS 04/04/19   [name], FNP    Allergies    Toradol [ketorolac tromethamine], Butalbital-apap-caffeine, Demerol, Esgic [butalbital-apap-caffeine], and Iodine  Review of Systems   Review of Systems  All other systems reviewed and are negative.   Physical Exam Updated Vital Signs BP (!) 131/91 (BP Location: Left Arm)   Pulse 90   Temp 98.9 F (37.2 C) (Oral)   Resp 18   Ht 5\' 1"  (1.549 m)   Wt 51.3 kg   SpO2 100%   BMI 21.37 kg/m   Physical Exam Vitals and nursing note reviewed.  Constitutional:      General: She is not in acute distress.    Appearance: She is well-developed and well-nourished. She is not ill-appearing, toxic-appearing or diaphoretic.  HENT:     Head: Normocephalic and atraumatic.  Eyes:     Extraocular Movements: EOM normal.      Conjunctiva/sclera: Conjunctivae normal.     Pupils: Pupils are equal, round, and reactive to light.  Neck:     Trachea: Phonation normal.  Cardiovascular:     Rate and Rhythm: Normal rate.  Pulmonary:     Effort: Pulmonary effort is normal.  Abdominal:     General: There is no distension.  Musculoskeletal:        General: Normal range of motion.     Cervical back: Normal range of motion and neck supple.  Skin:    General: Skin is warm and dry.  Neurological:     Mental Status: She is alert and oriented to person, place, and time.     Motor: No abnormal muscle tone.     Comments: No dysarthria or aphasia.  Psychiatric:        Mood and Affect: Mood and affect and mood normal.        Behavior: Behavior normal.     ED Results / Procedures / Treatments   Labs (all labs ordered are listed, but only abnormal results are displayed) Labs Reviewed  COMPREHENSIVE METABOLIC PANEL  ETHANOL  CBC WITH DIFFERENTIAL/PLATELET  RAPID URINE DRUG SCREEN, HOSP PERFORMED    EKG None  Radiology No results found.  Procedures Procedures (including critical care time)  Medications Ordered in ED Medications - No data to display  ED Course  I have reviewed the triage vital signs and the nursing notes.  Pertinent labs & imaging results that were available during my care of the patient were reviewed by me and considered in my medical decision making (see chart for details).    MDM Rules/Calculators/A&P  Patient Vitals for the past 24 hrs:  BP Temp Temp src Pulse Resp SpO2 Height Weight  06/18/20 2157 (!) 131/91 98.9 F (37.2 C) Oral 90 18 100 % -- --  06/18/20 2150 -- -- -- -- -- -- 5\' 1"  (1.549 m) 51.3 kg      Medical Decision Making:  This patient is presenting for evaluation of suspected alcohol intoxication, which does require a range of treatment options, and is a complaint that involves a moderate risk of morbidity and mortality. The differential  diagnoses include intoxication with mood altering substances, acute illness, renal insufficiency. I decided to review old records, and in summary elderly female, here for suspected alcohol intoxication with history of same..  I did not require additional historical information from anyone.  Clinical Laboratory Tests Ordered, included CBC, Metabolic panel and Urine drug screen, alcohol level.      Critical Interventions-clinical evaluation, laboratory testing, observation  CRITICAL CARE-no Performed by: Daleen Bo  Nursing Notes Reviewed/ Care Coordinated Applicable Imaging Reviewed Interpretation of Laboratory Data incorporated into ED treatment  Care to Dr. Sedonia Small, to evaluate after return of labs and a period of observation.    Final Clinical Impression(s) / ED Diagnoses Final diagnoses:  Alcoholic intoxication without complication [facility])  Renal insufficiency    Rx / DC Orders ED Discharge Orders    None       Daleen Bo, MD 06/18/20 2241

## 2020-06-18 NOTE — ED Triage Notes (Signed)
Pt here due to ETOH abuse. Pt was naked and screaming when police arrived on scene. Pt admits to drinking tonight but is unable to tell how much. Hx of same.

## 2020-06-19 DIAGNOSIS — F10929 Alcohol use, unspecified with intoxication, unspecified: Secondary | ICD-10-CM | POA: Diagnosis not present

## 2020-06-19 LAB — COMPREHENSIVE METABOLIC PANEL
ALT: 23 U/L (ref 0–44)
AST: 33 U/L (ref 15–41)
Albumin: 4 g/dL (ref 3.5–5.0)
Alkaline Phosphatase: 80 U/L (ref 38–126)
Anion gap: 14 (ref 5–15)
BUN: 21 mg/dL (ref 8–23)
CO2: 25 mmol/L (ref 22–32)
Calcium: 8.8 mg/dL — ABNORMAL LOW (ref 8.9–10.3)
Chloride: 102 mmol/L (ref 98–111)
Creatinine, Ser: 0.92 mg/dL (ref 0.44–1.00)
GFR, Estimated: >60 mL/min (ref >60)
Glucose, Bld: 100 mg/dL — ABNORMAL HIGH (ref 70–99)
Potassium: 3.3 mmol/L — ABNORMAL LOW (ref 3.5–5.1)
Sodium: 141 mmol/L (ref 135–145)
Total Bilirubin: 0.4 mg/dL (ref 0.3–1.2)
Total Protein: 6.9 g/dL (ref 6.5–8.1)

## 2020-06-19 LAB — CBC WITH DIFFERENTIAL/PLATELET
Abs Immature Granulocytes: 0.05 10*3/uL (ref 0.00–0.07)
Basophils Absolute: 0.1 10*3/uL (ref 0.0–0.1)
Basophils Relative: 1 %
Eosinophils Absolute: 0.1 10*3/uL (ref 0.0–0.5)
Eosinophils Relative: 1 %
HCT: 39.4 % (ref 36.0–46.0)
Hemoglobin: 13.1 g/dL (ref 12.0–15.0)
Immature Granulocytes: 1 %
Lymphocytes Relative: 21 %
Lymphs Abs: 2.1 10*3/uL (ref 0.7–4.0)
MCH: 28.4 pg (ref 26.0–34.0)
MCHC: 33.2 g/dL (ref 30.0–36.0)
MCV: 85.5 fL (ref 80.0–100.0)
Monocytes Absolute: 1 10*3/uL (ref 0.1–1.0)
Monocytes Relative: 10 %
Neutro Abs: 6.7 10*3/uL (ref 1.7–7.7)
Neutrophils Relative %: 66 %
Platelets: 398 10*3/uL (ref 150–400)
RBC: 4.61 MIL/uL (ref 3.87–5.11)
RDW: 15.4 % (ref 11.5–15.5)
WBC: 9.9 10*3/uL (ref 4.0–10.5)
nRBC: 0 % (ref 0.0–0.2)

## 2020-06-19 LAB — RAPID URINE DRUG SCREEN, HOSP PERFORMED
Amphetamines: NOT DETECTED
Barbiturates: NOT DETECTED
Benzodiazepines: NOT DETECTED
Cocaine: NOT DETECTED
Opiates: NOT DETECTED
Tetrahydrocannabinol: NOT DETECTED

## 2020-06-19 LAB — ETHANOL: Alcohol, Ethyl (B): 222 mg/dL — ABNORMAL HIGH (ref <10)

## 2020-06-19 NOTE — ED Notes (Signed)
Son, [name], and friend, Roselyn Reef, were contacted. [name] did not answer, and Roselyn Reef stated that he had no gas or car to come get her.   Pt stated that we should try to keep calling [name] because he should be getting off work soon.

## 2020-06-19 NOTE — ED Notes (Addendum)
Attempted to contact son, [name], other son [name], Ex-husband and daughter.   [name] stated that we needed to call her ex-husband because he's the only one that can come get her.   Daughter stated that she's drunk and can't come.  Number for ex-husband and other son [name] continue to go straight to voicemail. Will continue to try to call them.

## 2020-06-19 NOTE — ED Notes (Signed)
Pt son here to pick up pt. Pt a/o and ambulatory. Pt ambulated out of ED to familys car.

## 2020-06-19 NOTE — ED Notes (Signed)
Pt spoke with son who states he will come pick her up.

## 2020-06-19 NOTE — ED Provider Notes (Signed)
  Provider Note MRN:  [id number]  Arrival date & time: 06/19/20    ED Course and Medical Decision Making  Assumed care from Dr. Eulis Foster at shift change.  Alcohol intoxication, recent uptrending creatinine screening with labs.  Labs are reassuring, on my reevaluation she is well-appearing, no signs of trauma, conversant and sober enough for discharge.  Procedures  Final Clinical Impressions(s) / ED Diagnoses     ICD-10-CM   1. Alcoholic intoxication without complication ([place])  P94.327     ED Discharge Orders    None        Discharge Instructions     You were evaluated in the Emergency Department and after careful evaluation, we did not find any emergent condition requiring admission or further testing in the hospital.  Your exam/testing today was overall reassuring.  Please return to the Emergency Department if you experience any worsening of your condition.  Thank you for allowing [place] to be a part of your care.     Barth Kirks. Sedonia Small, [place] [email or web address]    Maudie Flakes, MD 06/19/20 [phone]

## 2020-06-19 NOTE — ED Notes (Signed)
Contacted son, [name], and pt spoke with him. Still unable to find transportation. Will continue to call family/friends and see if anyone can transport pt home.

## 2020-06-19 NOTE — Discharge Instructions (Addendum)
You were evaluated in the Emergency Department and after careful evaluation, we did not find any emergent condition requiring admission or further testing in the hospital.  Your exam/testing today was overall reassuring.  Please return to the Emergency Department if you experience any worsening of your condition.  Thank you for allowing us to be a part of your care.  

## 2020-06-19 NOTE — ED Notes (Addendum)
Stacy Dalton, [phone]  Amy, Daughter, [phone]  Stacy Dalton, [phone]  Ex-Husband, [place], [phone]

## 2020-06-21 ENCOUNTER — Telehealth: Payer: Self-pay | Admitting: Family

## 2020-06-21 ENCOUNTER — Other Ambulatory Visit: Payer: Self-pay | Admitting: Family Medicine

## 2020-06-21 ENCOUNTER — Other Ambulatory Visit: Payer: Medicare Other

## 2020-06-21 ENCOUNTER — Encounter: Payer: Self-pay | Admitting: Family

## 2020-06-21 ENCOUNTER — Ambulatory Visit (INDEPENDENT_AMBULATORY_CARE_PROVIDER_SITE_OTHER): Payer: Medicare Other | Admitting: Family

## 2020-06-21 ENCOUNTER — Other Ambulatory Visit: Payer: Self-pay

## 2020-06-21 VITALS — BP 157/69 | HR 61 | Temp 98.0°F | Ht 61.0 in | Wt 109.6 lb

## 2020-06-21 DIAGNOSIS — Z09 Encounter for follow-up examination after completed treatment for conditions other than malignant neoplasm: Secondary | ICD-10-CM

## 2020-06-21 DIAGNOSIS — F419 Anxiety disorder, unspecified: Secondary | ICD-10-CM

## 2020-06-21 DIAGNOSIS — E039 Hypothyroidism, unspecified: Secondary | ICD-10-CM | POA: Diagnosis not present

## 2020-06-21 DIAGNOSIS — M546 Pain in thoracic spine: Secondary | ICD-10-CM

## 2020-06-21 DIAGNOSIS — G894 Chronic pain syndrome: Secondary | ICD-10-CM

## 2020-06-21 DIAGNOSIS — R7989 Other specified abnormal findings of blood chemistry: Secondary | ICD-10-CM

## 2020-06-21 DIAGNOSIS — I10 Essential (primary) hypertension: Secondary | ICD-10-CM | POA: Diagnosis not present

## 2020-06-21 DIAGNOSIS — Z933 Colostomy status: Secondary | ICD-10-CM

## 2020-06-21 DIAGNOSIS — E559 Vitamin D deficiency, unspecified: Secondary | ICD-10-CM

## 2020-06-21 DIAGNOSIS — F1010 Alcohol abuse, uncomplicated: Secondary | ICD-10-CM

## 2020-06-21 DIAGNOSIS — F1120 Opioid dependence, uncomplicated: Secondary | ICD-10-CM

## 2020-06-21 DIAGNOSIS — J42 Unspecified chronic bronchitis: Secondary | ICD-10-CM

## 2020-06-21 DIAGNOSIS — E785 Hyperlipidemia, unspecified: Secondary | ICD-10-CM

## 2020-06-21 DIAGNOSIS — K219 Gastro-esophageal reflux disease without esophagitis: Secondary | ICD-10-CM

## 2020-06-21 DIAGNOSIS — Z780 Asymptomatic menopausal state: Secondary | ICD-10-CM

## 2020-06-21 DIAGNOSIS — G8929 Other chronic pain: Secondary | ICD-10-CM

## 2020-06-21 DIAGNOSIS — K5909 Other constipation: Secondary | ICD-10-CM | POA: Diagnosis not present

## 2020-06-21 DIAGNOSIS — G4700 Insomnia, unspecified: Secondary | ICD-10-CM

## 2020-06-21 DIAGNOSIS — F321 Major depressive disorder, single episode, moderate: Secondary | ICD-10-CM

## 2020-06-21 DIAGNOSIS — Z0289 Encounter for other administrative examinations: Secondary | ICD-10-CM

## 2020-06-21 MED ORDER — TEMAZEPAM 15 MG PO CAPS: 15.0000 mg | ORAL_CAPSULE | Freq: Every day | ORAL | 2 refills | Status: DC

## 2020-06-21 MED ORDER — TRAMADOL HCL 50 MG PO TABS: 100.0000 mg | ORAL_TABLET | Freq: Two times a day (BID) | ORAL | 2 refills | Status: DC | PRN

## 2020-06-21 NOTE — Patient Instructions (Signed)

## 2020-06-21 NOTE — Progress Notes (Signed)
Subjective:    Patient ID: Stacy Dalton, female    DOB: 1951-05-29, 69 y.o.   MRN: [id number]  Chief Complaint  Patient presents with  . Medication Refill   Ptcallsthe office today for chronic follow up. PT has pulmonologistsfor COPD and chronic cough, but states she has not seen them since COVID. I have given her their phone number to call and make follow up appt.  Hypertension This is a chronic problem. The current episode started more than 1 year ago. The problem has been resolved since onset. The problem is controlled. Associated symptoms include anxiety and malaise/fatigue. Pertinent negatives include no peripheral edema or shortness of breath. Risk factors for coronary artery disease include dyslipidemia, obesity and sedentary lifestyle. The current treatment provides moderate improvement. There is no history of heart failure. Identifiable causes of hypertension include a thyroid problem.  Gastroesophageal Reflux She complains of belching and heartburn. This is a chronic problem. The current episode started more than 1 year ago. The problem occurs occasionally. The problem has been waxing and waning. Associated symptoms include fatigue. She has tried a PPI for the symptoms. The treatment provided moderate relief.  Constipation This is a chronic problem. The current episode started more than 1 year ago. The problem has been waxing and waning since onset. Associated symptoms include back pain. Pertinent negatives include no rectal pain or vomiting.  Thyroid Problem Presents for follow-up visit. Symptoms include anxiety, constipation, depressed mood and fatigue. The symptoms have been stable. Her past medical history is significant for hyperlipidemia. There is no history of heart failure.  Anemia Presents for follow-up visit. Symptoms include malaise/fatigue. There is no history of heart failure.  Insomnia Primary symptoms: difficulty falling asleep, malaise/fatigue.  The current  episode started more than one year. The onset quality is gradual. The problem occurs intermittently. PMH includes: depression.  Hyperlipidemia This is a chronic problem. The current episode started more than 1 year ago. Pertinent negatives include no shortness of breath. Current antihyperlipidemic treatment includes statins. The current treatment provides moderate improvement of lipids. Risk factors for coronary artery disease include dyslipidemia, hypertension, a sedentary lifestyle and post-menopausal.  Depression        This is a chronic problem.  The current episode started more than 1 year ago.   The onset quality is sudden.   The problem occurs intermittently.  Associated symptoms include fatigue, insomnia, irritable, restlessness and sad.  Past treatments include SSRIs - Selective serotonin reuptake inhibitors.  Compliance with treatment is good.  Past medical history includes thyroid problem and anxiety.   Anxiety Presents for follow-up visit. Symptoms include depressed mood, excessive worry, insomnia, irritability, nervous/anxious behavior and restlessness. Patient reports no shortness of breath. Symptoms occur most days. The severity of symptoms is moderate. The quality of sleep is good.   Her past medical history is significant for anemia.  Back Pain This is a chronic problem. The current episode started more than 1 year ago. The problem occurs intermittently. The problem has been waxing and waning since onset. The pain is present in the thoracic spine. The pain is at a severity of 9/10. She has tried analgesics for the symptoms. The treatment provided mild relief.   COPD PT states her breathing is worsen with cough since the cooler weather. She is taking Breo daily.    Review of Systems  Constitutional: Positive for fatigue, irritability and malaise/fatigue.  Respiratory: Negative for shortness of breath.   Gastrointestinal: Positive for constipation and heartburn. Negative for  rectal  pain and vomiting.  Musculoskeletal: Positive for back pain.  Psychiatric/Behavioral: Positive for depression. The patient is nervous/anxious and has insomnia.   All other systems reviewed and are negative.      Objective:   Physical Exam Vitals reviewed.  Constitutional:      General: She is irritable. She is not in acute distress.    Appearance: She is well-developed and well-nourished.  HENT:     Head: Normocephalic and atraumatic.     Right Ear: Tympanic membrane normal.     Left Ear: Tympanic membrane normal.     Mouth/Throat:     Mouth: Oropharynx is clear and moist.  Eyes:     Pupils: Pupils are equal, round, and reactive to light.  Neck:     Thyroid: No thyromegaly.  Cardiovascular:     Rate and Rhythm: Regular rhythm. Tachycardia present.     Pulses: Intact distal pulses.     Heart sounds: Normal heart sounds. No murmur heard.   Pulmonary:     Effort: Pulmonary effort is normal. No respiratory distress.     Breath sounds: Normal breath sounds. No wheezing.  Abdominal:     General: Bowel sounds are normal. There is no distension.     Palpations: Abdomen is soft.     Tenderness: There is no abdominal tenderness.  Musculoskeletal:        General: No tenderness or edema. Normal range of motion.     Cervical back: Normal range of motion and neck supple.  Skin:    General: Skin is warm and dry.  Neurological:     Mental Status: She is alert and oriented to person, place, and time.     Cranial Nerves: No cranial nerve deficit.     Deep Tendon Reflexes: Reflexes are normal and symmetric.  Psychiatric:        Mood and Affect: Mood is anxious.        Behavior: Behavior normal.        Thought Content: Thought content normal.        Judgment: Judgment normal.       BP (!) 157/69   Pulse 61   Temp 98 F (36.7 C) (Temporal)   Ht 5\' 1"  (1.549 m)   Wt 109 lb 9.6 oz (49.7 kg)   BMI 20.71 kg/m      Assessment & Plan:  MICHALA DEBLANC comes in today with  chief complaint of Medication Refill   Diagnosis and orders addressed:  1. Primary hypertension  2. Chronic bronchitis, unspecified chronic bronchitis type ([place])  3. Gastroesophageal reflux disease without esophagitis  4. Chronic constipation  5. Hypothyroidism, unspecified type - TSH  6. Pain medication agreement signed - traMADol (ULTRAM) 50 MG tablet; Take 2 tablets (100 mg total) by mouth every 12 (twelve) hours as needed (pain).  Dispense: 120 tablet; Refill: 2  7. Anxiety  8. Uncomplicated opioid dependence (HCC) - traMADol (ULTRAM) 50 MG tablet; Take 2 tablets (100 mg total) by mouth every 12 (twelve) hours as needed (pain).  Dispense: 120 tablet; Refill: 2  9. Chronic bilateral thoracic back pain - traMADol (ULTRAM) 50 MG tablet; Take 2 tablets (100 mg total) by mouth every 12 (twelve) hours as needed (pain).  Dispense: 120 tablet; Refill: 2  10. Insomnia, unspecified type - temazepam (RESTORIL) 15 MG capsule; Take 1 capsule (15 mg total) by mouth at bedtime.  Dispense: 30 capsule; Refill: 2  11. Current moderate episode of major depressive disorder, unspecified whether  recurrent ([place])  12. Hyperlipidemia, unspecified hyperlipidemia type - Lipid panel  13. Chronic pain syndrome - traMADol (ULTRAM) 50 MG tablet; Take 2 tablets (100 mg total) by mouth every 12 (twelve) hours as needed (pain).  Dispense: 120 tablet; Refill: 2  14. S/P colostomy ([place])  15. Hospital discharge follow-up  16. Post-menopausal - DG [facility] DEXA  17. Vitamin D deficiency - VITAMIN D 25 Hydroxy (Vit-D Deficiency, Fractures)  18. Alcohol abuse    Labs pending Hospital notes reviewed- We have decreased her Ultram to #120 a month from #180. We discussed alcohol and the dangers of mixing.  Health Maintenance reviewed Diet and exercise encouraged  Follow up plan: 3 months   Evelina Dun, FNP

## 2020-06-21 NOTE — Telephone Encounter (Signed)
Can patient fill early ?
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
## 2020-06-21 NOTE — Telephone Encounter (Signed)
Pt needs for Stacy Dalton to send over permission to [facility] in [place] to go ahead and fill her tramadol early

## 2020-06-21 NOTE — Telephone Encounter (Signed)
Stacy Dalton said [facility] will not fill her pain meds because it is too early and they need your permission to fill it.

## 2020-06-22 ENCOUNTER — Other Ambulatory Visit: Payer: Self-pay | Admitting: Family

## 2020-06-22 ENCOUNTER — Ambulatory Visit (INDEPENDENT_AMBULATORY_CARE_PROVIDER_SITE_OTHER): Payer: Medicare Other

## 2020-06-22 DIAGNOSIS — Z23 Encounter for immunization: Secondary | ICD-10-CM

## 2020-06-22 LAB — CMP14+EGFR
ALT: 18 IU/L (ref 0–32)
AST: 28 IU/L (ref 0–40)
Albumin/Globulin Ratio: 2 (ref 1.2–2.2)
Albumin: 4.5 g/dL (ref 3.7–4.7)
Alkaline Phosphatase: 103 IU/L (ref 44–121)
BUN/Creatinine Ratio: 16 (ref 12–28)
BUN: 14 mg/dL (ref 8–27)
Bilirubin Total: 0.2 mg/dL (ref 0.0–1.2)
CO2: 19 mmol/L — ABNORMAL LOW (ref 20–29)
Calcium: 10 mg/dL (ref 8.7–10.3)
Chloride: 101 mmol/L (ref 96–106)
Creatinine, Ser: 0.86 mg/dL (ref 0.57–1.00)
GFR calc Af Amer: 79 mL/min/{1.73_m2} (ref >59)
GFR calc non Af Amer: 68 mL/min/{1.73_m2} (ref >59)
Globulin, Total: 2.3 g/dL (ref 1.5–4.5)
Glucose: 94 mg/dL (ref 65–99)
Potassium: 3.8 mmol/L (ref 3.5–5.2)
Sodium: 143 mmol/L (ref 134–144)
Total Protein: 6.8 g/dL (ref 6.0–8.5)

## 2020-06-22 LAB — LIPID PANEL
Chol/HDL Ratio: 2.8 ratio (ref 0.0–4.4)
Cholesterol, Total: 207 mg/dL — ABNORMAL HIGH (ref 100–199)
HDL: 75 mg/dL (ref >39)
LDL Chol Calc (NIH): 77 mg/dL (ref 0–99)
Triglycerides: 350 mg/dL — ABNORMAL HIGH (ref 0–149)
VLDL Cholesterol Cal: 55 mg/dL — ABNORMAL HIGH (ref 5–40)

## 2020-06-22 LAB — VITAMIN D 25 HYDROXY (VIT D DEFICIENCY, FRACTURES): Vit D, 25-Hydroxy: 20.6 ng/mL — ABNORMAL LOW (ref 30.0–100.0)

## 2020-06-22 LAB — TSH: TSH: 4.91 u[IU]/mL — ABNORMAL HIGH (ref 0.450–4.500)

## 2020-06-22 MED ORDER — LEVOTHYROXINE SODIUM 50 MCG PO TABS: 50.0000 ug | ORAL_TABLET | Freq: Every day | ORAL | 2 refills | Status: DC

## 2020-06-22 MED ORDER — VITAMIN D (ERGOCALCIFEROL) 1.25 MG (50000 UNIT) PO CAPS: 50000.0000 [IU] | ORAL_CAPSULE | ORAL | 3 refills | Status: DC

## 2020-06-22 NOTE — Telephone Encounter (Signed)
Pharmacist aware ok to fill Tramadol early

## 2020-06-22 NOTE — Telephone Encounter (Signed)
Ok to fill early, patient going out of town. We have decreased dose this month.

## 2020-07-15 ENCOUNTER — Telehealth: Payer: Self-pay

## 2020-07-15 NOTE — Telephone Encounter (Signed)
Appointment scheduled.

## 2020-07-16 ENCOUNTER — Ambulatory Visit (INDEPENDENT_AMBULATORY_CARE_PROVIDER_SITE_OTHER): Payer: Medicare Other | Admitting: Family

## 2020-07-16 ENCOUNTER — Encounter: Payer: Self-pay | Admitting: Family

## 2020-07-16 DIAGNOSIS — N390 Urinary tract infection, site not specified: Secondary | ICD-10-CM | POA: Diagnosis not present

## 2020-07-16 MED ORDER — DOXYCYCLINE HYCLATE 100 MG PO TABS: 100.0000 mg | ORAL_TABLET | Freq: Two times a day (BID) | ORAL | 0 refills | Status: DC

## 2020-07-16 MED ORDER — CEFTRIAXONE SODIUM 1 G IJ SOLR
1.0000 g | Freq: Once | INTRAMUSCULAR | Status: AC
  Administered 2020-07-16: 1 g via INTRAMUSCULAR

## 2020-07-16 NOTE — Progress Notes (Signed)
   Virtual Visit via telephone Note Due to COVID-19 pandemic this visit was conducted virtually. This visit type was conducted due to national recommendations for restrictions regarding the COVID-19 Pandemic (e.g. social distancing, sheltering in place) in an effort to limit this patient's exposure and mitigate transmission in our community. All issues noted in this document were discussed and addressed.  A physical exam was not performed with this format.  I connected with Stacy Dalton on 07/16/20 at 11:56 AM by telephone and verified that I am speaking with the correct person using two identifiers. Stacy Dalton is currently located at home and daughter and grandkids is currently with her during visit. The provider, Evelina Dun, FNP is located in their office at time of visit.  I discussed the limitations, risks, security and privacy concerns of performing an evaluation and management service by telephone and the availability of in person appointments. I also discussed with the patient that there may be a patient responsible charge related to this service. The patient expressed understanding and agreed to proceed.   History and Present Illness:  PT calls the office today with recurrent UTI. She states 2-3 weeks ago she was started on Cipro. She reports she did not take the entire dose, because of nausea and vomiting.  Dysuria  This is a recurrent problem. The current episode started 1 to 4 weeks ago. The problem occurs intermittently. The problem has been waxing and waning. The quality of the pain is described as burning. The pain is at a severity of 9/10. The pain is moderate. Associated symptoms include flank pain, frequency, nausea, urgency and vomiting. Pertinent negatives include no hematuria. She has tried antibiotics for the symptoms. The treatment provided mild relief.     Review of Systems  Gastrointestinal: Positive for nausea and vomiting.  Genitourinary: Positive for  dysuria, flank pain, frequency and urgency. Negative for hematuria.     Observations/Objective: No SOB or distress noted   Assessment and Plan: 1. Recurrent UTI Force fluids AZO over the counter X2 days RTO if symptoms worsen or do not improve  Culture pending - Urinalysis, Complete - Urine Culture - cefTRIAXone (ROCEPHIN) injection 1 g - doxycycline (VIBRA-TABS) 100 MG tablet; Take 1 tablet (100 mg total) by mouth 2 (two) times daily.  Dispense: 20 tablet; Refill: 0    I discussed the assessment and treatment plan with the patient. The patient was provided an opportunity to ask questions and all were answered. The patient agreed with the plan and demonstrated an understanding of the instructions.   The patient was advised to call back or seek an in-person evaluation if the symptoms worsen or if the condition fails to improve as anticipated.  The above assessment and management plan was discussed with the patient. The patient verbalized understanding of and has agreed to the management plan. Patient is aware to call the clinic if symptoms persist or worsen. Patient is aware when to return to the clinic for a follow-up visit. Patient educated on when it is appropriate to go to the emergency department.   Time call ended:  12::08 pm   I provided 12 minutes of non-face-to-face time during this encounter.    Evelina Dun, FNP

## 2020-07-19 ENCOUNTER — Other Ambulatory Visit: Payer: Self-pay | Admitting: Family

## 2020-07-19 DIAGNOSIS — A084 Viral intestinal infection, unspecified: Secondary | ICD-10-CM

## 2020-08-11 ENCOUNTER — Emergency Department (HOSPITAL_COMMUNITY)
Admission: EM | Admit: 2020-08-11 | Discharge: 2020-08-11 | Disposition: A | Discharge status: Home / Self Care | Payer: Medicare Other | Attending: Emergency Medicine | Admitting: Emergency Medicine

## 2020-08-11 ENCOUNTER — Emergency Department (HOSPITAL_COMMUNITY): Payer: Medicare Other

## 2020-08-11 ENCOUNTER — Other Ambulatory Visit: Payer: Self-pay

## 2020-08-11 ENCOUNTER — Encounter (HOSPITAL_COMMUNITY): Payer: Self-pay

## 2020-08-11 DIAGNOSIS — Z79899 Other long term (current) drug therapy: Secondary | ICD-10-CM | POA: Diagnosis not present

## 2020-08-11 DIAGNOSIS — I10 Essential (primary) hypertension: Secondary | ICD-10-CM | POA: Diagnosis not present

## 2020-08-11 DIAGNOSIS — Z87891 Personal history of nicotine dependence: Secondary | ICD-10-CM | POA: Diagnosis not present

## 2020-08-11 DIAGNOSIS — S0990XA Unspecified injury of head, initial encounter: Secondary | ICD-10-CM | POA: Diagnosis present

## 2020-08-11 DIAGNOSIS — R0789 Other chest pain: Secondary | ICD-10-CM | POA: Diagnosis not present

## 2020-08-11 DIAGNOSIS — R410 Disorientation, unspecified: Secondary | ICD-10-CM | POA: Diagnosis not present

## 2020-08-11 DIAGNOSIS — E039 Hypothyroidism, unspecified: Secondary | ICD-10-CM | POA: Insufficient documentation

## 2020-08-11 DIAGNOSIS — W19XXXA Unspecified fall, initial encounter: Secondary | ICD-10-CM | POA: Diagnosis not present

## 2020-08-11 DIAGNOSIS — R079 Chest pain, unspecified: Secondary | ICD-10-CM | POA: Diagnosis not present

## 2020-08-11 DIAGNOSIS — F10930 Alcohol use, unspecified with withdrawal, uncomplicated: Secondary | ICD-10-CM

## 2020-08-11 DIAGNOSIS — F32A Depression, unspecified: Secondary | ICD-10-CM | POA: Diagnosis not present

## 2020-08-11 DIAGNOSIS — J449 Chronic obstructive pulmonary disease, unspecified: Secondary | ICD-10-CM | POA: Insufficient documentation

## 2020-08-11 DIAGNOSIS — F10239 Alcohol dependence with withdrawal, unspecified: Secondary | ICD-10-CM | POA: Insufficient documentation

## 2020-08-11 DIAGNOSIS — F10230 Alcohol dependence with withdrawal, uncomplicated: Secondary | ICD-10-CM

## 2020-08-11 DIAGNOSIS — R069 Unspecified abnormalities of breathing: Secondary | ICD-10-CM | POA: Diagnosis not present

## 2020-08-11 DIAGNOSIS — R110 Nausea: Secondary | ICD-10-CM | POA: Diagnosis not present

## 2020-08-11 LAB — CBC WITH DIFFERENTIAL/PLATELET
Abs Immature Granulocytes: 0.03 10*3/uL (ref 0.00–0.07)
Basophils Absolute: 0 10*3/uL (ref 0.0–0.1)
Basophils Relative: 1 %
Eosinophils Absolute: 0.4 10*3/uL (ref 0.0–0.5)
Eosinophils Relative: 5 %
HCT: 36.4 % (ref 36.0–46.0)
Hemoglobin: 11.5 g/dL — ABNORMAL LOW (ref 12.0–15.0)
Immature Granulocytes: 1 %
Lymphocytes Relative: 25 %
Lymphs Abs: 1.6 10*3/uL (ref 0.7–4.0)
MCH: 27.6 pg (ref 26.0–34.0)
MCHC: 31.6 g/dL (ref 30.0–36.0)
MCV: 87.5 fL (ref 80.0–100.0)
Monocytes Absolute: 0.6 10*3/uL (ref 0.1–1.0)
Monocytes Relative: 9 %
Neutro Abs: 3.8 10*3/uL (ref 1.7–7.7)
Neutrophils Relative %: 59 %
Platelets: 263 10*3/uL (ref 150–400)
RBC: 4.16 MIL/uL (ref 3.87–5.11)
RDW: 16.4 % — ABNORMAL HIGH (ref 11.5–15.5)
WBC: 6.4 10*3/uL (ref 4.0–10.5)
nRBC: 0 % (ref 0.0–0.2)

## 2020-08-11 LAB — BASIC METABOLIC PANEL
Anion gap: 8 (ref 5–15)
BUN: 15 mg/dL (ref 8–23)
CO2: 26 mmol/L (ref 22–32)
Calcium: 8.4 mg/dL — ABNORMAL LOW (ref 8.9–10.3)
Chloride: 107 mmol/L (ref 98–111)
Creatinine, Ser: 0.55 mg/dL (ref 0.44–1.00)
GFR, Estimated: >60 mL/min (ref >60)
Glucose, Bld: 102 mg/dL — ABNORMAL HIGH (ref 70–99)
Potassium: 3.4 mmol/L — ABNORMAL LOW (ref 3.5–5.1)
Sodium: 141 mmol/L (ref 135–145)

## 2020-08-11 LAB — TROPONIN I (HIGH SENSITIVITY)
Troponin I (High Sensitivity): 8 ng/L (ref <18)
Troponin I (High Sensitivity): 7 ng/L (ref <18)

## 2020-08-11 MED ORDER — THIAMINE HCL 100 MG PO TABS
100.0000 mg | ORAL_TABLET | Freq: Once | ORAL | Status: AC
  Administered 2020-08-11: 100 mg via ORAL
  Filled 2020-08-11: qty 1

## 2020-08-11 MED ORDER — IOHEXOL 350 MG/ML SOLN
100.0000 mL | Freq: Once | INTRAVENOUS | Status: AC | PRN
  Administered 2020-08-11: 100 mL via INTRAVENOUS

## 2020-08-11 MED ORDER — CHLORDIAZEPOXIDE HCL 25 MG PO CAPS
50.0000 mg | ORAL_CAPSULE | Freq: Once | ORAL | Status: AC
  Administered 2020-08-11: 50 mg via ORAL
  Filled 2020-08-11: qty 2

## 2020-08-11 MED ORDER — LORAZEPAM 2 MG/ML IJ SOLN
1.0000 mg | Freq: Once | INTRAMUSCULAR | Status: AC
  Administered 2020-08-11: 1 mg via INTRAVENOUS
  Filled 2020-08-11: qty 1

## 2020-08-11 MED ORDER — FOLIC ACID 1 MG PO TABS
1.0000 mg | ORAL_TABLET | Freq: Every day | ORAL | 1 refills | Status: DC
Start: 2020-08-11 — End: 2021-04-19

## 2020-08-11 MED ORDER — TRAMADOL HCL 50 MG PO TABS
100.0000 mg | ORAL_TABLET | Freq: Once | ORAL | Status: AC
Start: 2020-08-11 — End: 2020-08-11
  Administered 2020-08-11: 100 mg via ORAL
  Filled 2020-08-11: qty 2

## 2020-08-11 MED ORDER — CHLORDIAZEPOXIDE HCL 25 MG PO CAPS
ORAL_CAPSULE | ORAL | 0 refills | Status: DC
Start: 2020-08-11 — End: 2020-10-11

## 2020-08-11 MED ORDER — THIAMINE HCL 100 MG PO TABS
100.0000 mg | ORAL_TABLET | Freq: Every day | ORAL | 1 refills | Status: DC
Start: 2020-08-11 — End: 2020-10-11

## 2020-08-11 MED ORDER — LORAZEPAM 1 MG PO TABS: 1.0000 mg | ORAL_TABLET | Freq: Once | ORAL | Status: DC

## 2020-08-11 MED ORDER — FOLIC ACID 1 MG PO TABS
1.0000 mg | ORAL_TABLET | Freq: Once | ORAL | Status: AC
  Administered 2020-08-11: 1 mg via ORAL
  Filled 2020-08-11: qty 1

## 2020-08-11 NOTE — ED Provider Notes (Signed)
  Physical Exam  BP (!) 156/83   Pulse 88   Temp 98.3 F (36.8 C)   Resp 12   Ht 5\' 1"  (1.549 m)   Wt 49.9 kg   SpO2 99%   BMI 20.78 kg/m   Physical Exam  ED Course/Procedures     Procedures  MDM   Assuming care of patient from Dr. Langston Masker.   Patient in the ED for chest pain. Workup thus far shows delta trops - below cutoffs. CT dissection was neg.  Concerning findings are as following - none. Important pending results are Ct head.  According to Dr. Langston Masker, plan is to d/c post CT head. Ct ordered to r.o ICH.   Patient had no complains, no concerns from the nursing side. Will continue to monitor.     Varney Biles, MD 08/11/20 [phone]

## 2020-08-11 NOTE — ED Notes (Signed)
Patient transported to CT 

## 2020-08-11 NOTE — ED Triage Notes (Signed)
Pt presents to ED via [facility] with complaints of sharp chest pain radiating to left ear started at [place]. Pt given 2 nitro, 324 mg ASA PTA, pain level now 1/10. Pt was having intercourse when pain started.

## 2020-08-11 NOTE — Discharge Instructions (Addendum)
Instructions   For CHEST PAIN - It is very important you call to make an appointment with a heart doctor.  Your workup in the ER today did not exclude all heart disease.  You should see a heart specialist as soon as possible.  If your chest pain comes back, or worsens, or you begin having difficulty breathing or lightheadedness, return to the ER.    *  For HEAD INJURY - Your CT scan did not show signs of brain bleed.  You may have a mild concussion.  These symptoms usually go away in 30 days for most people.  Be careful around the house to avoid more falls or injuries.  Follow up with your primary care provider for this issue.  *  For alcohol withdrawal -  I prescribed Librium, a medication to help with withdrawal symptoms (shaking, tremors, anxiety, etc).  Take it as directed, but only if you need it.  It is a benzo drug and will make you drowsy.  Do not drive after taking it.  Read over the information I included about alcohol withdrawal.  If you have a seizure at home you need to come back to the ER.  These can be life threatening.  I also prescribed vitamins.  Many people who drink heavily are lacking in these vitamins.  Follow up with your primary care doctor for blood tests and for your alcohol treatment.

## 2020-08-11 NOTE — ED Provider Notes (Signed)
[facility] EMERGENCY DEPARTMENT Provider Note   CSN: [id number] Arrival date & time: 08/11/20  1761     History Chief Complaint  Patient presents with  . Chest Pain    Stacy Dalton is a 70 y.o. female w/ hx of COPD presenting to ED with chest pain.  Patient reports that she was having intercourse this morning, and began having sharp, sudden left-sided chest pain.  She is gradually began to radiate towards her left ear.  It was very intense.  She said similar episodes in the past, but usually resolve after a few minutes, but this 1 is more intense.  She called 911.  EMS gave the patient 30 to 24 mg of aspirin as well as 2 nitroglycerin.  She is not sure whether the nitro helped right away.  However pain is now completely gone.  She denies any associated headache, vision changes, nausea or vomiting during the episode this morning.  She is generally poor historian.  However, she does tell me that she gets this episodes may be "once every couple of months".  She cannot tell me if there was exertional or if they also happen at rest.  She denies any history of MI or cardiac stents.  She denies any personal family history of aortic aneurysm.  She says she quit smoking in 2000.  She has high cholesterol and does not take her statin due to myalgias.  She also has high blood pressure and takes medication for this.  Currently she is completely asymptomatic.  She feels normal.  HPI     Past Medical History:  Diagnosis Date  . Chronic pain   . COPD (chronic obstructive pulmonary disease) ([place])    told has copd, no current inhaler use  . Cough   . Depression   . GERD (gastroesophageal reflux disease)   . Hypertension   . Hypothyroidism   . Insomnia   . Migraine   . Migraine   . Osteopenia   . Pain management   . Panic attacks   . Small bowel obstruction [facility])     Patient Active Problem List   Diagnosis Date Noted  . Alcohol abuse 06/21/2020  . Multifocal pneumonia 09/19/2018   . Large bowel obstruction ([place])   . Small bowel obstruction ([place]) 07/20/2018  . Hypomagnesemia 07/20/2018  . Cough 03/14/2018  . Fatigue 03/14/2018  . Polypharmacy 02/23/2017  . Hypokalemia 02/23/2017  . Toxic metabolic encephalopathy 60/73/7106  . Anxiety 02/22/2017  . GERD (gastroesophageal reflux disease) 02/22/2017  . Pain medication agreement signed 10/10/2016  . Opioid dependence ([place]) 10/10/2016  . Status post colostomy takedown 04/12/2016  . Chronic constipation 12/03/2015  . Peritonitis with abscess of intestine ([place]) 11/29/2015  . COPD exacerbation ([place]) 09/20/2015  . Osteopenia 06/10/2014  . Vitamin D deficiency 06/10/2014  . Hypothyroidism   . Depression 11/18/2012  . Insomnia 11/18/2012  . Chronic pain syndrome 11/18/2012  . HLD (hyperlipidemia) 09/01/2012  . S/P colostomy ([place]) 04/19/2012  . Normocytic anemia 10/15/2011  . Hypertension 10/13/2011  . Chronic back pain 10/13/2011  . COPD (chronic obstructive pulmonary disease) ([place]) 10/09/2011    Past Surgical History:  Procedure Laterality Date  . ABDOMINAL HYSTERECTOMY    . COLON SURGERY    . COLOSTOMY CLOSURE  04/19/2012   Procedure: COLOSTOMY CLOSURE;  Surgeon: Adin Hector, MD;  Location: [facility] ORS;  Service: General;  Laterality: N/A;  Laparotomy, Resection and Closure of Colostomy  . COLOSTOMY TAKEDOWN N/A 04/12/2016   Procedure: [place]  TAKEDOWN;  Surgeon: Johnathan Hausen, MD;  Location: [facility];  Service: General;  Laterality: N/A;  . INCONTINENCE SURGERY    . LAPAROTOMY  10/04/2011, colostomy also   Procedure: EXPLORATORY LAPAROTOMY;  Surgeon: Adin Hector, MD;  Location: [facility];  Service: General;  Laterality: N/A;  left partial colectomy with colostomy  . LAPAROTOMY  04/19/2012   Procedure: EXPLORATORY LAPAROTOMY;  Surgeon: Adin Hector, MD;  Location: [facility];  Service: General;  Laterality: N/A;  . LAPAROTOMY N/A 11/29/2015   Procedure: EXPLORATORY LAPAROTOMY; SUBTOTAL COLECTOMY WITH HARTMAN  PROCEDURE AND END COLOSTOMY;  Surgeon: Johnathan Hausen, MD;  Location: [facility];  Service: General;  Laterality: N/A;  . TUBAL LIGATION    . VENTRAL HERNIA REPAIR  04/19/2012   Procedure: HERNIA REPAIR VENTRAL ADULT;  Surgeon: Adin Hector, MD;  Location: [facility];  Service: General;  Laterality: N/A;     OB History    Gravida      Para      Term      Preterm      AB      Living  5     SAB      IAB      Ectopic      Multiple      Live Births              Family History  Problem Relation Age of Onset  . Heart disease Mother   . Hypertension Mother   . Cancer Sister        sinus  . Diabetes Brother   . Heart disease Brother   . Thyroid disease Brother   . Cancer Sister        abdominal ?  . Thyroid disease Sister   . Cancer Other        GE junction adenocarcinoma  . Emphysema Father   . Stroke Father   . Hypertension Father   . Heart disease Father   . COPD Sister   . Thyroid disease Sister   . Thyroid disease Sister   . Diabetes Brother   . Thyroid disease Brother   . Thyroid disease Brother   . Hypertension Brother   . Post-traumatic stress disorder Brother   . COPD Brother   . Thyroid disease Brother   . Thyroid disease Brother   . Hypertension Brother   . Transient ischemic attack Brother     Social History   Tobacco Use  . Smoking status: Former Smoker    Packs/day: 1.50    Years: 20.00    Pack years: 30.00    Types: Cigarettes    Quit date: 10/04/1998    Years since quitting: 21.8  . Smokeless tobacco: Never Used  Vaping Use  . Vaping Use: Never used  Substance Use Topics  . Alcohol use: Yes  . Drug use: No    Home Medications Prior to Admission medications   Medication Sig Start Date End Date Taking? Authorizing Provider  amLODipine-olmesartan (AZOR) 10-40 MG tablet Take 1 tablet by mouth daily. 01/16/20  Yes Hawks, Christy A, FNP  chlordiazePOXIDE (LIBRIUM) 25 MG capsule 50mg  PO TID x 1D, then 25-50mg  PO BID X 1D, then  25-50mg  PO QD X 1D 08/11/20  Yes Aliesha Dolata, Carola Rhine, MD  DULoxetine (CYMBALTA) 60 MG capsule Take 1 capsule (60 mg total) by mouth daily. 04/27/20  Yes Hawks, Christy A, FNP  folic acid (FOLVITE) 1 MG tablet Take 1 tablet (1 mg total) by mouth daily.  08/11/20  Yes Wyvonnia Dusky, MD  hydrOXYzine (VISTARIL) 25 MG capsule Take 1 capsule (25 mg total) by mouth 3 (three) times daily as needed. Patient taking differently: Take 25 mg by mouth 3 (three) times daily as needed for anxiety. 01/16/20  Yes Hawks, Christy A, FNP  levothyroxine (SYNTHROID) 50 MCG tablet Take 1 tablet (50 mcg total) by mouth daily before breakfast. 06/22/20  Yes Hawks, Christy A, FNP  ondansetron (ZOFRAN) 4 MG tablet TAKE 1 TABLET(4 MG) BY MOUTH EVERY 8 HOURS AS NEEDED FOR NAUSEA Patient taking differently: Take 4 mg by mouth every 8 (eight) hours as needed for nausea or vomiting. 07/19/20  Yes Hawks, Christy A, FNP  temazepam (RESTORIL) 15 MG capsule Take 1 capsule (15 mg total) by mouth at bedtime. 06/21/20  Yes Hawks, Christy A, FNP  thiamine 100 MG tablet Take 1 tablet (100 mg total) by mouth daily for 60 doses. 08/11/20 10/10/20 Yes Anthem Frazer, Carola Rhine, MD  traMADol (ULTRAM) 50 MG tablet Take 2 tablets (100 mg total) by mouth every 12 (twelve) hours as needed (pain). 06/21/20  Yes Hawks, Christy A, FNP  Vitamin D, Ergocalciferol, (DRISDOL) 1.25 MG (50000 UNIT) CAPS capsule Take 1 capsule (50,000 Units total) by mouth every 7 (seven) days. 06/22/20  Yes Hawks, Christy A, FNP  albuterol (PROVENTIL) (2.5 MG/3ML) 0.083% nebulizer solution Take 3 mLs (2.5 mg total) by nebulization every 6 (six) hours as needed for wheezing or shortness of breath. Patient not taking: No sig reported 07/25/19   Evelina Dun A, FNP  atorvastatin (LIPITOR) 20 MG tablet Take 1 tablet (20 mg total) by mouth daily. Patient not taking: Reported on 08/11/2020 07/18/19 07/17/20  [name], FNP  doxycycline (VIBRA-TABS) 100 MG tablet Take 1 tablet (100 mg total)  by mouth 2 (two) times daily. Patient not taking: Reported on 08/11/2020 07/16/20   Evelina Dun A, FNP  fluticasone furoate-vilanterol (BREO ELLIPTA) 200-25 MCG/INH AEPB Inhale 1 puff into the lungs daily. Patient not taking: No sig reported 01/16/20   [name], FNP    Allergies    Toradol [ketorolac tromethamine], Butalbital-apap-caffeine, Demerol, Esgic [butalbital-apap-caffeine], and Iodine  Review of Systems   Review of Systems  Constitutional: Negative for chills and fever.  Eyes: Negative for pain and visual disturbance.  Respiratory: Positive for chest tightness. Negative for shortness of breath.   Cardiovascular: Positive for chest pain. Negative for palpitations.  Gastrointestinal: Negative for abdominal pain and vomiting.  Musculoskeletal: Positive for neck pain. Negative for arthralgias and back pain.  Skin: Negative for color change and rash.  Neurological: Negative for syncope and light-headedness.  All other systems reviewed and are negative.   Physical Exam Updated Vital Signs BP (!) 156/83   Pulse 88   Temp 98.3 F (36.8 C)   Resp 12   Ht 5\' 1"  (1.549 m)   Wt 49.9 kg   SpO2 99%   BMI 20.78 kg/m   Physical Exam Constitutional:      General: She is not in acute distress.    Comments: Thin  HENT:     Head: Normocephalic and atraumatic.  Eyes:     Conjunctiva/sclera: Conjunctivae normal.     Pupils: Pupils are equal, round, and reactive to light.  Cardiovascular:     Rate and Rhythm: Normal rate and regular rhythm.     Pulses:          Carotid pulses are 0 on the right side and 0 on the left side.  Heart sounds: No murmur heard.   Pulmonary:     Effort: Pulmonary effort is normal. No respiratory distress.  Abdominal:     General: There is no distension.     Tenderness: There is no abdominal tenderness.  Skin:    General: Skin is warm and dry.  Neurological:     General: No focal deficit present.     Mental Status: She is alert. Mental  status is at baseline. She is disoriented.     GCS: GCS eye subscore is 4. GCS verbal subscore is 5. GCS motor subscore is 6.     Cranial Nerves: No cranial nerve deficit.     Sensory: Sensation is intact.     Motor: No weakness.     Gait: Gait is intact.  Psychiatric:        Mood and Affect: Mood normal.        Behavior: Behavior normal.     ED Results / Procedures / Treatments   Labs (all labs ordered are listed, but only abnormal results are displayed) Labs Reviewed  BASIC METABOLIC PANEL - Abnormal; Notable for the following components:      Result Value   Potassium 3.4 (*)    Glucose, Bld 102 (*)    Calcium 8.4 (*)    All other components within normal limits  CBC WITH DIFFERENTIAL/PLATELET - Abnormal; Notable for the following components:   Hemoglobin 11.5 (*)    RDW 16.4 (*)    All other components within normal limits  TROPONIN I (HIGH SENSITIVITY)  TROPONIN I (HIGH SENSITIVITY)    EKG EKG Interpretation  Date/Time:  Wednesday August 11 2020 10:08:39 EST Ventricular Rate:  79 PR Interval:    QRS Duration: 85 QT Interval:  394 QTC Calculation: 452 R Axis:   102 Text Interpretation: Sinus rhythm Right axis deviation Baseline wander in lead(s) II III aVF No STEMI Confirmed by Octaviano Glow [phone]) on 08/11/2020 10:46:30 AM   Radiology CT Head Wo Contrast  Result Date: 08/11/2020 CLINICAL DATA:  Episodes of falling with head trauma. Question concussion. Confusion. EXAM: CT HEAD WITHOUT CONTRAST TECHNIQUE: Contiguous axial images were obtained from the base of the skull through the vertex without intravenous contrast. COMPARISON:  01/03/2020 FINDINGS: Brain: The brain shows a normal appearance without evidence of malformation, atrophy, old or acute small or large vessel infarction, mass lesion, hemorrhage, hydrocephalus or extra-axial collection. Vascular: No hyperdense vessel. No evidence of atherosclerotic calcification. Skull: Normal.  No traumatic finding.  No  focal bone lesion. Sinuses/Orbits: Sinuses are clear. Orbits appear normal. Mastoids are clear. Other: None significant IMPRESSION: Normal head CT. Electronically Signed   By: Nelson Chimes M.D.   On: 08/11/2020 16:16   DG Chest Portable 1 View  Result Date: 08/11/2020 CLINICAL DATA:  Chest pain EXAM: PORTABLE CHEST 1 VIEW COMPARISON:  12/12/2019 FINDINGS: Cardiac shadow is mildly enlarged but stable. Aortic calcifications are again seen. Lungs are well aerated bilaterally. No focal infiltrate or sizable effusion is seen. Mild hyperinflation is again noted. No bony abnormality is seen. IMPRESSION: COPD without acute abnormality. Electronically Signed   By: Inez Catalina M.D.   On: 08/11/2020 11:01   CT ANGIO CHEST AORTA W/CM & OR WO/CM  Result Date: 08/11/2020 CLINICAL DATA:  Acute left-sided chest pain during intercourse, initial encounter EXAM: CT ANGIOGRAPHY CHEST WITH CONTRAST TECHNIQUE: Multidetector CT imaging of the chest was performed using the standard protocol during bolus administration of intravenous contrast. Multiplanar CT image reconstructions and MIPs were obtained  to evaluate the vascular anatomy. CONTRAST:  187mL OMNIPAQUE IOHEXOL 350 MG/ML SOLN COMPARISON:  Chest x-ray from earlier in the same day. FINDINGS: Cardiovascular: Some motion and beam hardening artifact is identified. The thoracic aorta and its branches demonstrate atherosclerotic calcifications. No aneurysmal dilatation or dissection is noted. No significant cardiac enlargement is seen. The pulmonary artery shows a normal branching pattern without intraluminal filling defect to suggest pulmonary embolism. Scattered coronary calcifications are seen. Mediastinum/Nodes: Thoracic inlet is within normal limits. No sizable hilar or mediastinal adenopathy is noted. Mild thickening of the distal esophagus is noted likely related to reflux. Fluid is noted within the esophagus as well. Lungs/Pleura: Lungs are well aerated bilaterally. No  focal infiltrate or sizable effusion is seen. No sizable parenchymal nodule is noted. Upper Abdomen: Fatty infiltration of the liver is noted. The remainder of the upper abdomen appears within normal limits. Musculoskeletal: No chest wall abnormality. No acute or significant osseous findings. Review of the MIP images confirms the above findings. IMPRESSION: No evidence of aortic dissection or pulmonary embolism. No acute abnormality noted. Aortic Atherosclerosis (ICD10-I70.0). Electronically Signed   By: Inez Catalina M.D.   On: 08/11/2020 12:34    Procedures Procedures   Medications Ordered in ED Medications  LORazepam (ATIVAN) tablet 1 mg (1 mg Oral Not Given 08/11/20 1453)  traMADol (ULTRAM) tablet 100 mg (100 mg Oral Given 08/11/20 1428)  iohexol (OMNIPAQUE) 350 MG/ML injection 100 mL (100 mLs Intravenous Contrast Given 08/11/20 1206)  LORazepam (ATIVAN) injection 1 mg (1 mg Intravenous Given 08/11/20 1440)  thiamine tablet 100 mg (100 mg Oral Given 02/01/49 5397)  folic acid (FOLVITE) tablet 1 mg (1 mg Oral Given 08/11/20 1617)  chlordiazePOXIDE (LIBRIUM) capsule 50 mg (50 mg Oral Given 08/11/20 1617)    ED Course  I have reviewed the triage vital signs and the nursing notes.  Pertinent labs & imaging results that were available during my care of the patient were reviewed by me and considered in my medical decision making (see chart for details)  This patient presents to the Emergency Department with complaint of chest pain. This involves an extensive number of treatment options, and is a complaint that carries with it a high risk of complications and morbidity.  The differential diagnosis includes ACS vs Pneumothorax vs PE vs Aortic dissection vs Reflux/Gastritis vs MSK pain vs Pneumonia vs other.  Supplemental history provided by patient's fiance subsequently also raised concern for head injury and etoh use disorder/withdrawal.  No reported seizures at home.  See ED course below.  I ordered,  reviewed, and interpreted labs, including BMP and CBC.  There were no immediate, life-threatening emergencies found in this labwork.  The patient's troponin level was 8 -> 7.  No clear evidence of ACS/NSTEMI at this time. I ordered medication tramadol for chronic back pain, thiamine, folate and ativan for etoh withdrawal I ordered imaging studies which included CT dissection chest, CTH, and DG chest I independently visualized and interpreted imaging which showed no acute dissection, no acute intracranial injury, no evident PNA - agree with remainder of radiology report - and the monitor tracing which showed sinus rhythm I personally reviewed the patients ECG which showed sinus rhythm with no acute ischemic findings  After the interventions stated above, I reevaluated the patient and found that they remained clinically stable.  Based on the patient's clinical exam, vital signs, risk factors, and ED testing, I felt that the patient's overall risk of life-threatening emergency such as ACS, PE, sepsis, intracranial  hemorrhage/bleed, or infection was low.    Clinical Course as of 08/11/20 1738  Wed Aug 11, 2020  1616 Patient remains pain free.  Her fiance arrived at bedside and reported concerns for her having significant etoh use daily (1/2 handle of vodka), and she has begun to show signs of withdrawal.  We'll treat with IV ativan and PO librium here. They're both clear about wanting to get her to quit drinking.  We talked about librium for home.  He will encourage her (he doesn't drink).  We talked about seizures and return precautions.  We will need a CTH now as he reports she has hit her head several times this week - he's worried about a concussion.  We'll need to rule out ICH.  If negative she can be discharged home. [MT]  1618 I made clear to them that she needs to see a cardiologist ASAP for her episodes of chest pain.  They understand the ED workup was not exhaustive and doesn't exclude ACS.  I do  feel she is stable for discharge with negative delta trops and pain free in the ED, but advised returning if symptoms return. [MT]  1621 CTH negative. [MT]    Clinical Course User Index [MT] Langston Masker Carola Rhine, MD    Final Clinical Impression(s) / ED Diagnoses Final diagnoses:  Alcohol withdrawal syndrome without complication ([place])  Fall, initial encounter  Chest pain, unspecified type    Rx / DC Orders ED Discharge Orders         Ordered    chlordiazePOXIDE (LIBRIUM) 25 MG capsule        97/41/63 8453    folic acid (FOLVITE) 1 MG tablet  Daily        08/11/20 1612    thiamine 100 MG tablet  Daily        08/11/20 1612           Wyvonnia Dusky, MD 08/11/20 1738

## 2020-08-19 DIAGNOSIS — R0902 Hypoxemia: Secondary | ICD-10-CM | POA: Diagnosis not present

## 2020-08-19 DIAGNOSIS — R109 Unspecified abdominal pain: Secondary | ICD-10-CM | POA: Diagnosis not present

## 2020-08-19 DIAGNOSIS — Z8701 Personal history of pneumonia (recurrent): Secondary | ICD-10-CM | POA: Diagnosis not present

## 2020-08-19 DIAGNOSIS — R778 Other specified abnormalities of plasma proteins: Secondary | ICD-10-CM | POA: Insufficient documentation

## 2020-08-19 DIAGNOSIS — I509 Heart failure, unspecified: Secondary | ICD-10-CM | POA: Diagnosis not present

## 2020-08-19 DIAGNOSIS — R7401 Elevation of levels of liver transaminase levels: Secondary | ICD-10-CM | POA: Insufficient documentation

## 2020-08-19 DIAGNOSIS — I2510 Atherosclerotic heart disease of native coronary artery without angina pectoris: Secondary | ICD-10-CM | POA: Diagnosis not present

## 2020-08-19 DIAGNOSIS — G8929 Other chronic pain: Secondary | ICD-10-CM | POA: Insufficient documentation

## 2020-08-19 DIAGNOSIS — M545 Low back pain, unspecified: Secondary | ICD-10-CM | POA: Insufficient documentation

## 2020-08-19 DIAGNOSIS — R404 Transient alteration of awareness: Secondary | ICD-10-CM | POA: Diagnosis not present

## 2020-08-19 DIAGNOSIS — R410 Disorientation, unspecified: Secondary | ICD-10-CM | POA: Diagnosis not present

## 2020-08-19 DIAGNOSIS — R4020 Unspecified coma: Secondary | ICD-10-CM | POA: Diagnosis not present

## 2020-08-19 DIAGNOSIS — I700 Atherosclerosis of aorta: Secondary | ICD-10-CM | POA: Diagnosis not present

## 2020-08-19 DIAGNOSIS — Z87891 Personal history of nicotine dependence: Secondary | ICD-10-CM | POA: Diagnosis not present

## 2020-08-19 DIAGNOSIS — J189 Pneumonia, unspecified organism: Secondary | ICD-10-CM | POA: Diagnosis not present

## 2020-08-19 DIAGNOSIS — R531 Weakness: Secondary | ICD-10-CM | POA: Diagnosis not present

## 2020-08-19 DIAGNOSIS — R748 Abnormal levels of other serum enzymes: Secondary | ICD-10-CM | POA: Insufficient documentation

## 2020-08-19 DIAGNOSIS — R4182 Altered mental status, unspecified: Secondary | ICD-10-CM | POA: Diagnosis not present

## 2020-08-20 DIAGNOSIS — R0902 Hypoxemia: Secondary | ICD-10-CM | POA: Diagnosis not present

## 2020-08-20 DIAGNOSIS — J42 Unspecified chronic bronchitis: Secondary | ICD-10-CM | POA: Insufficient documentation

## 2020-08-20 DIAGNOSIS — E440 Moderate protein-calorie malnutrition: Secondary | ICD-10-CM | POA: Insufficient documentation

## 2020-08-20 DIAGNOSIS — I509 Heart failure, unspecified: Secondary | ICD-10-CM | POA: Diagnosis not present

## 2020-08-20 DIAGNOSIS — I350 Nonrheumatic aortic (valve) stenosis: Secondary | ICD-10-CM | POA: Diagnosis not present

## 2020-08-20 DIAGNOSIS — M545 Low back pain, unspecified: Secondary | ICD-10-CM | POA: Diagnosis not present

## 2020-08-20 DIAGNOSIS — J189 Pneumonia, unspecified organism: Secondary | ICD-10-CM | POA: Diagnosis not present

## 2020-08-20 DIAGNOSIS — I517 Cardiomegaly: Secondary | ICD-10-CM | POA: Diagnosis not present

## 2020-08-21 DIAGNOSIS — G8929 Other chronic pain: Secondary | ICD-10-CM | POA: Diagnosis not present

## 2020-08-21 DIAGNOSIS — I509 Heart failure, unspecified: Secondary | ICD-10-CM | POA: Diagnosis not present

## 2020-08-21 DIAGNOSIS — J690 Pneumonitis due to inhalation of food and vomit: Secondary | ICD-10-CM | POA: Diagnosis not present

## 2020-08-21 DIAGNOSIS — R748 Abnormal levels of other serum enzymes: Secondary | ICD-10-CM | POA: Diagnosis not present

## 2020-08-21 DIAGNOSIS — M545 Low back pain, unspecified: Secondary | ICD-10-CM | POA: Diagnosis not present

## 2020-08-21 DIAGNOSIS — R0602 Shortness of breath: Secondary | ICD-10-CM | POA: Diagnosis not present

## 2020-08-21 DIAGNOSIS — R7989 Other specified abnormal findings of blood chemistry: Secondary | ICD-10-CM | POA: Diagnosis not present

## 2020-08-22 DIAGNOSIS — J690 Pneumonitis due to inhalation of food and vomit: Secondary | ICD-10-CM | POA: Diagnosis not present

## 2020-08-22 DIAGNOSIS — M545 Low back pain, unspecified: Secondary | ICD-10-CM | POA: Diagnosis not present

## 2020-08-22 DIAGNOSIS — R7989 Other specified abnormal findings of blood chemistry: Secondary | ICD-10-CM | POA: Diagnosis not present

## 2020-08-22 DIAGNOSIS — G8929 Other chronic pain: Secondary | ICD-10-CM | POA: Diagnosis not present

## 2020-08-22 DIAGNOSIS — I509 Heart failure, unspecified: Secondary | ICD-10-CM | POA: Diagnosis not present

## 2020-08-22 DIAGNOSIS — R748 Abnormal levels of other serum enzymes: Secondary | ICD-10-CM | POA: Diagnosis not present

## 2020-08-23 ENCOUNTER — Telehealth: Payer: Self-pay

## 2020-08-23 DIAGNOSIS — I509 Heart failure, unspecified: Secondary | ICD-10-CM | POA: Diagnosis not present

## 2020-08-23 DIAGNOSIS — F1011 Alcohol abuse, in remission: Secondary | ICD-10-CM | POA: Diagnosis not present

## 2020-08-23 DIAGNOSIS — M545 Low back pain, unspecified: Secondary | ICD-10-CM | POA: Diagnosis not present

## 2020-08-23 DIAGNOSIS — J42 Unspecified chronic bronchitis: Secondary | ICD-10-CM | POA: Diagnosis not present

## 2020-08-23 DIAGNOSIS — R778 Other specified abnormalities of plasma proteins: Secondary | ICD-10-CM | POA: Diagnosis not present

## 2020-08-23 DIAGNOSIS — I350 Nonrheumatic aortic (valve) stenosis: Secondary | ICD-10-CM | POA: Diagnosis not present

## 2020-08-23 DIAGNOSIS — E440 Moderate protein-calorie malnutrition: Secondary | ICD-10-CM | POA: Diagnosis not present

## 2020-08-23 DIAGNOSIS — E876 Hypokalemia: Secondary | ICD-10-CM | POA: Diagnosis not present

## 2020-08-23 DIAGNOSIS — G8929 Other chronic pain: Secondary | ICD-10-CM | POA: Diagnosis not present

## 2020-08-23 DIAGNOSIS — R7401 Elevation of levels of liver transaminase levels: Secondary | ICD-10-CM | POA: Diagnosis not present

## 2020-08-23 DIAGNOSIS — J690 Pneumonitis due to inhalation of food and vomit: Secondary | ICD-10-CM | POA: Diagnosis not present

## 2020-08-23 DIAGNOSIS — R748 Abnormal levels of other serum enzymes: Secondary | ICD-10-CM | POA: Diagnosis not present

## 2020-08-23 NOTE — Telephone Encounter (Signed)
Transition Care Management Unsuccessful Follow-up Telephone Call  Date of discharge and from where:  08/23/20 [facility] health care  Attempts:  1st Attempt  Reason for unsuccessful TCM follow-up call:  Unable to leave message

## 2020-08-24 ENCOUNTER — Telehealth: Payer: Self-pay

## 2020-08-24 NOTE — Telephone Encounter (Signed)
Contact Date: 08/24/2020 Contacted By: Viviana Simpler  Transition Care Management Follow-up Telephone Call  Date of discharge and from where: 08/23/20  Discharge Diagnosis:Pneumonia  How have you been since you were released from the hospital? Improving but states that she feels week and continues to have a unsteady gate.  Any questions or concerns? No   Items Reviewed:  Did the pt receive and understand the discharge instructions provided? Yes   Medications obtained and verified? Yes   Any new allergies since your discharge? No   Dietary orders reviewed? Yes  Do you have support at home? Yes   Discontinued Medications None New Medications Added Ferrous Sulfate, Albuterol Inhaler, Pantoprazole  Current Medication List Allergies as of 08/24/2020      Reactions   Toradol [ketorolac Tromethamine] Shortness Of Breath   Butalbital-apap-caffeine Other (See Comments)   jittery   Demerol Swelling   Esgic [butalbital-apap-caffeine] Other (See Comments)   jittery   Iodine Other (See Comments)   bp bottomed out per pt several hours later; unsure if pre medicated in past with cm; done in [place]      Medication List       Accurate as of August 24, 2020 11:35 AM. If you have any questions, ask your nurse or doctor.        albuterol (2.5 MG/3ML) 0.083% nebulizer solution Commonly known as: PROVENTIL Take 3 mLs (2.5 mg total) by nebulization every 6 (six) hours as needed for wheezing or shortness of breath.   amLODipine-olmesartan 10-40 MG tablet Commonly known as: AZOR Take 1 tablet by mouth daily.   atorvastatin 20 MG tablet Commonly known as: Lipitor Take 1 tablet (20 mg total) by mouth daily.   Breo Ellipta 200-25 MCG/INH Aepb Generic drug: fluticasone furoate-vilanterol Inhale 1 puff into the lungs daily.   chlordiazePOXIDE 25 MG capsule Commonly known as: LIBRIUM 50mg  PO TID x 1D, then 25-50mg  PO BID X 1D, then 25-50mg  PO QD X 1D   doxycycline 100 MG  tablet Commonly known as: VIBRA-TABS Take 1 tablet (100 mg total) by mouth 2 (two) times daily.   DULoxetine 60 MG capsule Commonly known as: CYMBALTA Take 1 capsule (60 mg total) by mouth daily.   folic acid 1 MG tablet Commonly known as: FOLVITE Take 1 tablet (1 mg total) by mouth daily.   hydrOXYzine 25 MG capsule Commonly known as: Vistaril Take 1 capsule (25 mg total) by mouth 3 (three) times daily as needed. What changed: reasons to take this   levothyroxine 50 MCG tablet Commonly known as: Synthroid Take 1 tablet (50 mcg total) by mouth daily before breakfast.   ondansetron 4 MG tablet Commonly known as: ZOFRAN TAKE 1 TABLET(4 MG) BY MOUTH EVERY 8 HOURS AS NEEDED FOR NAUSEA What changed: See the new instructions.   temazepam 15 MG capsule Commonly known as: Restoril Take 1 capsule (15 mg total) by mouth at bedtime.   thiamine 100 MG tablet Take 1 tablet (100 mg total) by mouth daily for 60 doses.   traMADol 50 MG tablet Commonly known as: ULTRAM Take 2 tablets (100 mg total) by mouth every 12 (twelve) hours as needed (pain).   Vitamin D (Ergocalciferol) 1.25 MG (50000 UNIT) Caps capsule Commonly known as: DRISDOL Take 1 capsule (50,000 Units total) by mouth every 7 (seven) days.        Home Care and Equipment/Supplies: Were home health services ordered? yes If so, what is the name of the agency? Patient does not remeber  Has the agency set up a time to come to the patient's home? yes Were any new equipment or medical supplies ordered?  Yes: [facility] What is the name of the medical supply agency? Unknown Were you able to get the supplies/equipment? Yes Do you have any questions related to the use of the equipment or supplies? No  Functional Questionnaire: (I = Independent and D = Dependent) ADLs: I  Bathing/Dressing- I  Meal Prep- I  Eating- I  Maintaining continence- I  Transferring/Ambulation- D  Managing Meds- I  Follow up appointments  reviewed:   PCP Hospital f/u appt confirmed? Yes  Scheduled to see Hawks on 08/27/20 .  [facility] f/u appt confirmed? No  Scheduled to see n/a on n/a @ n/a.  Are transportation arrangements needed? No   If their condition worsens, is the pt aware to call PCP or go to the Emergency Dept.? Yes  Was the patient provided with contact information for the PCP's office or ED? Yes  Was to pt encouraged to call back with questions or concerns? Yes

## 2020-08-25 NOTE — Telephone Encounter (Signed)
Contact Date: 08/25/2020 Contacted By: Georgina Pillion, LPN   Transition Care Management Follow-up Telephone Call  Date of discharge and from where: 08/23/20 [facility] [place]  Discharge Diagnosis:double pneumonia  How have you been since you were released from the hospital? Doing ok   Any questions or concerns? No   Items Reviewed:  Did the pt receive and understand the discharge instructions provided? Yes   Medications obtained and verified? Yes   Any new allergies since your discharge? No   Dietary orders reviewed? Yes  Do you have support at home? Yes   Discontinued Medications n/a New Medications Added Yes - care everywhere   Current Medication List Allergies as of 08/23/2020      Reactions   Toradol [ketorolac Tromethamine] Shortness Of Breath   Butalbital-apap-caffeine Other (See Comments)   jittery   Demerol Swelling   Esgic [butalbital-apap-caffeine] Other (See Comments)   jittery   Iodine Other (See Comments)   bp bottomed out per pt several hours later; unsure if pre medicated in past with cm; done in [place]      Medication List       Accurate as of August 23, 2020 11:59 PM. If you have any questions, ask your nurse or doctor.        albuterol (2.5 MG/3ML) 0.083% nebulizer solution Commonly known as: PROVENTIL Take 3 mLs (2.5 mg total) by nebulization every 6 (six) hours as needed for wheezing or shortness of breath.   amLODipine-olmesartan 10-40 MG tablet Commonly known as: AZOR Take 1 tablet by mouth daily.   atorvastatin 20 MG tablet Commonly known as: Lipitor Take 1 tablet (20 mg total) by mouth daily.   Breo Ellipta 200-25 MCG/INH Aepb Generic drug: fluticasone furoate-vilanterol Inhale 1 puff into the lungs daily.   chlordiazePOXIDE 25 MG capsule Commonly known as: LIBRIUM 50mg  PO TID x 1D, then 25-50mg  PO BID X 1D, then 25-50mg  PO QD X 1D   doxycycline 100 MG tablet Commonly known as: VIBRA-TABS Take 1 tablet (100 mg total) by  mouth 2 (two) times daily.   DULoxetine 60 MG capsule Commonly known as: CYMBALTA Take 1 capsule (60 mg total) by mouth daily.   folic acid 1 MG tablet Commonly known as: FOLVITE Take 1 tablet (1 mg total) by mouth daily.   hydrOXYzine 25 MG capsule Commonly known as: Vistaril Take 1 capsule (25 mg total) by mouth 3 (three) times daily as needed. What changed: reasons to take this   levothyroxine 50 MCG tablet Commonly known as: Synthroid Take 1 tablet (50 mcg total) by mouth daily before breakfast.   ondansetron 4 MG tablet Commonly known as: ZOFRAN TAKE 1 TABLET(4 MG) BY MOUTH EVERY 8 HOURS AS NEEDED FOR NAUSEA What changed: See the new instructions.   temazepam 15 MG capsule Commonly known as: Restoril Take 1 capsule (15 mg total) by mouth at bedtime.   thiamine 100 MG tablet Take 1 tablet (100 mg total) by mouth daily for 60 doses.   traMADol 50 MG tablet Commonly known as: ULTRAM Take 2 tablets (100 mg total) by mouth every 12 (twelve) hours as needed (pain).   Vitamin D (Ergocalciferol) 1.25 MG (50000 UNIT) Caps capsule Commonly known as: DRISDOL Take 1 capsule (50,000 Units total) by mouth every 7 (seven) days.        Home Care and Equipment/Supplies: Were home health services ordered? yes If so, what is the name of the agency? Pt is unsure  Has the agency set up a  time to come to the patient's home? Yes - PT came today  Were any new equipment or medical supplies ordered?  No What is the name of the medical supply agency? n/a Were you able to get the supplies/equipment? not applicable Do you have any questions related to the use of the equipment or supplies? No  Functional Questionnaire: (I = Independent and D = Dependent) ADLs: i  Bathing/Dressing- i  Meal Prep- i  Eating- i  Maintaining continence- i  Transferring/Ambulation- i  Managing Meds- i  Follow up appointments reviewed:   PCP Hospital f/u appt confirmed? Yes  Scheduled to see  [facility]  on 08/27/20 @ 1140 am.  [facility] f/u appt confirmed? No  will not see pulmonary for 3 months  Are transportation arrangements needed? No   If their condition worsens, is the pt aware to call PCP or go to the Emergency Dept.? Yes  Was the patient provided with contact information for the PCP's office or ED? Yes  Was to pt encouraged to call back with questions or concerns? Yes

## 2020-08-27 ENCOUNTER — Other Ambulatory Visit: Payer: Self-pay

## 2020-08-27 ENCOUNTER — Ambulatory Visit (INDEPENDENT_AMBULATORY_CARE_PROVIDER_SITE_OTHER): Payer: Medicare Other

## 2020-08-27 ENCOUNTER — Ambulatory Visit (INDEPENDENT_AMBULATORY_CARE_PROVIDER_SITE_OTHER): Payer: Medicare Other | Admitting: Family

## 2020-08-27 ENCOUNTER — Encounter: Payer: Self-pay | Admitting: Family

## 2020-08-27 VITALS — BP 127/70 | HR 82 | Temp 96.1°F | Ht 61.0 in | Wt 119.0 lb

## 2020-08-27 DIAGNOSIS — J42 Unspecified chronic bronchitis: Secondary | ICD-10-CM

## 2020-08-27 DIAGNOSIS — J189 Pneumonia, unspecified organism: Secondary | ICD-10-CM

## 2020-08-27 DIAGNOSIS — Z09 Encounter for follow-up examination after completed treatment for conditions other than malignant neoplasm: Secondary | ICD-10-CM | POA: Diagnosis not present

## 2020-08-27 DIAGNOSIS — F1120 Opioid dependence, uncomplicated: Secondary | ICD-10-CM

## 2020-08-27 DIAGNOSIS — G894 Chronic pain syndrome: Secondary | ICD-10-CM

## 2020-08-27 DIAGNOSIS — Z23 Encounter for immunization: Secondary | ICD-10-CM

## 2020-08-27 DIAGNOSIS — Z0289 Encounter for other administrative examinations: Secondary | ICD-10-CM

## 2020-08-27 DIAGNOSIS — F419 Anxiety disorder, unspecified: Secondary | ICD-10-CM

## 2020-08-27 DIAGNOSIS — G8929 Other chronic pain: Secondary | ICD-10-CM

## 2020-08-27 DIAGNOSIS — R911 Solitary pulmonary nodule: Secondary | ICD-10-CM | POA: Diagnosis not present

## 2020-08-27 DIAGNOSIS — M546 Pain in thoracic spine: Secondary | ICD-10-CM

## 2020-08-27 MED ORDER — HYDROXYZINE PAMOATE 25 MG PO CAPS: 25.0000 mg | ORAL_CAPSULE | Freq: Three times a day (TID) | ORAL | 2 refills | Status: DC | PRN

## 2020-08-27 MED ORDER — TRAMADOL HCL 50 MG PO TABS
100.0000 mg | ORAL_TABLET | Freq: Two times a day (BID) | ORAL | 2 refills | Status: DC | PRN
Start: 2020-08-27 — End: 2020-10-05

## 2020-08-27 NOTE — Addendum Note (Signed)
Addended by: Evelina Dun A on: 08/27/2020 02:37 PM   Modules accepted: Level of Service

## 2020-08-27 NOTE — Patient Instructions (Signed)
Aspiration Pneumonia, Adult  Aspiration pneumonia is an infection that occurs after lung (pulmonary) aspiration. Pulmonary aspiration is when you inhale a large amount of food, liquid, stomach acid, or saliva into the lungs. This can cause inflammation and infection in the lungs. This can make you cough and make it hard to breathe. Aspiration pneumonia is a serious condition and can be life-threatening. What are the causes? This condition may be caused by:  Bacteria in food, liquid, stomach acid, or saliva that is inhaled into the lung.  Irritation and inflammation that results from material, such as blood or a foreign body, being inhaled into the lung. This can lead to an infection even though the material is not originally contaminated with bacteria. What increases the risk? You are more likely to get aspiration pneumonia if you have a condition that makes it hard to breathe, swallow, cough, or gag. These conditions may include:  A breathing disorder, such as chronic obstructive pulmonary disease, that makes it hard to eat or drink while breathing.  A brain (neurologic) disorder, such as stroke, seizures, Parkinson's disease, dementia, amyotrophic lateral sclerosis (ALS), or brain injury.  Having gastroesophageal reflux disease (GERD).  Having a weak disease-fighting system (immune system).  Having a narrowing of the tube that carries food to the stomach (esophageal narrowing). Other factors that may make you more likely to get aspiration pneumonia include:  Being older than age 60 and frail.  Being given a general anesthetic for procedures.  Drinking too much alcohol and passing out. If you pass out and vomit, then vomit can be inhaled into your lungs.  Taking certain medicines, such as tranquilizers or sedatives.  Taking poor care of your mouth and teeth.  Being malnourished. What are the signs or symptoms? The main symptom of pulmonary aspiration may be an episode of choking  or coughing while eating or drinking. When aspiration pneumonia develops, symptoms include:  Persistent cough.  Difficulty breathing, such as wheezing or shortness of breath.  Fever.  Chest pain.  Being more tired than usual (fatigue). Pulmonary aspiration may be silent, meaning that it is not associated with coughing or choking while eating or drinking. How is this diagnosed? This condition may be diagnosed based on:  A physical exam.  Tests, such as: ? A chest X-ray. ? A sputum culture. Saliva and mucus (sputum) are collected from the lungs or the tubes that carry air to the lungs (bronchi). The sputum is then tested for bacteria. ? Oximetry. A sensor or clip is placed on areas such as a finger, earlobe, or toe to measure the oxygen level in your blood. ? Blood tests. ? A swallowing study. This test looks at how food is swallowed and whether it goes into your windpipe (trachea) or esophagus. ? A bronchoscopy. This test uses a flexible tube (bronchoscope) to see inside the lungs. How is this treated? This condition may be treated with:  Medicines. Antibiotic medicine will be given to kill the pneumonia bacteria. Other medicines may also be used to reduce fever, pain, or inflammation.  Breathing assistance and oxygen therapy. Depending on how well you are breathing, you may need to be given oxygen, or you may need breathing support from a breathing machine (ventilator).  Thoracentesis. This is a procedure to remove fluid that has built up in the space between the linings of the chest wall and the lungs.  Dietary changes. You may need to avoid certain food textures or liquids. For people who have recurrent aspiration pneumonia,   a feeding tube might be placed in the stomach for nutrition. Follow these instructions at home: Medicines  Take over-the-counter and prescription medicines only as told by your health care provider.  If you were prescribed an antibiotic medicine, take  it as told by your health care provider. Do not stop taking the antibiotic even if you start to feel better.  Take cough medicine only if you are losing sleep. Cough medicine can prevent your body's natural ability to remove mucus from your lungs.   General instructions  Carefully follow any eating instructions you were given, such as avoiding certain food textures or thickening your liquids. Thickening liquids reduces the risk of developing aspiration pneumonia again.  Return to normal activities as told by your health care provider. Ask your health care provider what activities are safe for you.  Sleep in a semi-upright position at night. Try to sleep in a reclining chair, or place a few pillows under your head in bed.  Do not use any products that contain nicotine or tobacco, such as cigarettes, e-cigarettes, and chewing tobacco. If you need help quitting, ask your health care provider.  Keep all follow-up visits as told by your health care provider. This is important.   Contact a health care provider if you:  Have a fever.  Are coughing or choking while eating or drinking.  Continue to have signs or symptoms of aspiration pneumonia. Get help right away if you have:  Worsening shortness of breath, wheezing, or difficulty breathing.  Chest pain. Summary  Aspiration pneumonia is an infection that occurs after lung (pulmonary) aspiration. Pulmonary aspiration is when you inhale a large amount of food, liquid, stomach acid, or saliva into the lungs.  The main symptom of pulmonary aspiration may be an episode of choking or coughing. It may also be silent without coughing or choking.  You are more likely to get aspiration pneumonia if you have a condition that makes it hard to breathe, swallow, cough, or gag. This information is not intended to replace advice given to you by your health care provider. Make sure you discuss any questions you have with your health care provider. Document  Revised: 06/20/2019 Document Reviewed: 06/20/2019 Elsevier Patient Education  2021 Elsevier Inc.  

## 2020-08-27 NOTE — Progress Notes (Signed)
Subjective:    Patient ID: Stacy Dalton, female    DOB: 1951-04-04, 70 y.o.   MRN: [id number]  Chief Complaint  Patient presents with  . Hospitalization Follow-up    HPI  Today's visit was for Transitional Care Management.  The patient was discharged from [facility] on 08/23/20 with a primary diagnosis of Bilateral pneumonia .   Contact with the patient and/or caregiver, by a clinical staff member, was made on 08/24/20 and was documented as a telephone encounter within the EMR.  Through chart review and discussion with the patient I have determined that management of their condition is of moderate complexity.   She currently has home health three times a week.   She was discharged on Augmentin and is continuing this. She reports her breathing is greatly improved. Denies any fever or wheezing. She is taking her Breo daily.   She reports she has quit her liquor over the last few days. However, than states she still drinks a "medicine cup" to help with her nerves as needed.   Requesting refill on her Ultram.      Review of Systems  All other systems reviewed and are negative.      Objective:   Physical Exam Vitals reviewed.  Constitutional:      General: She is not in acute distress.    Appearance: She is well-developed and well-nourished.  HENT:     Head: Normocephalic and atraumatic.     Right Ear: Tympanic membrane normal.     Left Ear: Tympanic membrane normal.     Mouth/Throat:     Mouth: Oropharynx is clear and moist.  Eyes:     Pupils: Pupils are equal, round, and reactive to light.  Neck:     Thyroid: No thyromegaly.  Cardiovascular:     Rate and Rhythm: Normal rate and regular rhythm.     Pulses: Intact distal pulses.     Heart sounds: Normal heart sounds. No murmur heard.   Pulmonary:     Effort: Pulmonary effort is normal. No respiratory distress.     Breath sounds: Normal breath sounds. No wheezing.  Abdominal:     General: Bowel sounds  are normal. There is no distension.     Palpations: Abdomen is soft.     Tenderness: There is no abdominal tenderness.  Musculoskeletal:        General: No tenderness or edema. Normal range of motion.     Cervical back: Normal range of motion and neck supple.  Skin:    General: Skin is warm and dry.  Neurological:     Mental Status: She is alert and oriented to person, place, and time.     Cranial Nerves: No cranial nerve deficit.     Deep Tendon Reflexes: Reflexes are normal and symmetric.  Psychiatric:        Mood and Affect: Mood and affect normal.        Behavior: Behavior normal.        Thought Content: Thought content normal.        Judgment: Judgment normal.       BP 127/70   Pulse 82   Temp (!) 96.1 F (35.6 C) (Temporal)   Ht 5' 1"  (1.549 m)   Wt 119 lb (54 kg)   BMI 22.48 kg/m      Assessment & Plan:  DENISS WORMLEY comes in today with chief complaint of Hospitalization Follow-up   Diagnosis and orders addressed:  1. Hospital  discharge follow-up - CBC with Differential/Platelet - CMP14+EGFR  2. Chronic bronchitis, unspecified chronic bronchitis type ([place]) - CBC with Differential/Platelet - CMP14+EGFR - DG Chest 2 View; Future  3. Anxiety - CBC with Differential/Platelet - CMP14+EGFR - hydrOXYzine (VISTARIL) 25 MG capsule; Take 1 capsule (25 mg total) by mouth 3 (three) times daily as needed for anxiety.  Dispense: 90 capsule; Refill: 2  4. Pain medication agreement signed - traMADol (ULTRAM) 50 MG tablet; Take 2 tablets (100 mg total) by mouth every 12 (twelve) hours as needed (pain).  Dispense: 120 tablet; Refill: 2  5. Uncomplicated opioid dependence (HCC) - traMADol (ULTRAM) 50 MG tablet; Take 2 tablets (100 mg total) by mouth every 12 (twelve) hours as needed (pain).  Dispense: 120 tablet; Refill: 2  6. Chronic bilateral thoracic back pain - traMADol (ULTRAM) 50 MG tablet; Take 2 tablets (100 mg total) by mouth every 12 (twelve) hours as  needed (pain).  Dispense: 120 tablet; Refill: 2  7. Chronic pain syndrome - traMADol (ULTRAM) 50 MG tablet; Take 2 tablets (100 mg total) by mouth every 12 (twelve) hours as needed (pain).  Dispense: 120 tablet; Refill: 2   Labs pending Patient reviewed in [place] controlled database, no flags noted. Contract and drug screen are up to date. Health Maintenance reviewed Diet and exercise encouraged  Follow up plan: 3 months    Evelina Dun, FNP

## 2020-08-28 LAB — CBC WITH DIFFERENTIAL/PLATELET
Basophils Absolute: 0.1 10*3/uL (ref 0.0–0.2)
Basos: 1 %
EOS (ABSOLUTE): 1 10*3/uL — ABNORMAL HIGH (ref 0.0–0.4)
Eos: 8 %
Hematocrit: 30.7 % — ABNORMAL LOW (ref 34.0–46.6)
Hemoglobin: 9.9 g/dL — ABNORMAL LOW (ref 11.1–15.9)
Immature Grans (Abs): 0.5 10*3/uL — ABNORMAL HIGH (ref 0.0–0.1)
Immature Granulocytes: 4 %
Lymphocytes Absolute: 1.5 10*3/uL (ref 0.7–3.1)
Lymphs: 12 %
MCH: 27.7 pg (ref 26.6–33.0)
MCHC: 32.2 g/dL (ref 31.5–35.7)
MCV: 86 fL (ref 79–97)
Monocytes Absolute: 1.6 10*3/uL — ABNORMAL HIGH (ref 0.1–0.9)
Monocytes: 14 %
Neutrophils Absolute: 7.4 10*3/uL — ABNORMAL HIGH (ref 1.4–7.0)
Neutrophils: 61 %
Platelets: 431 10*3/uL (ref 150–450)
RBC: 3.58 x10E6/uL — ABNORMAL LOW (ref 3.77–5.28)
RDW: 15.7 % — ABNORMAL HIGH (ref 11.7–15.4)
WBC: 12 10*3/uL — ABNORMAL HIGH (ref 3.4–10.8)

## 2020-08-28 LAB — CMP14+EGFR
ALT: 15 IU/L (ref 0–32)
AST: 16 IU/L (ref 0–40)
Albumin/Globulin Ratio: 1.8 (ref 1.2–2.2)
Albumin: 3.7 g/dL (ref 3.7–4.7)
Alkaline Phosphatase: 68 IU/L (ref 44–121)
BUN/Creatinine Ratio: 28 (ref 12–28)
BUN: 27 mg/dL (ref 8–27)
Bilirubin Total: 0.2 mg/dL (ref 0.0–1.2)
CO2: 24 mmol/L (ref 20–29)
Calcium: 9.5 mg/dL (ref 8.7–10.3)
Chloride: 100 mmol/L (ref 96–106)
Creatinine, Ser: 0.97 mg/dL (ref 0.57–1.00)
GFR calc Af Amer: 67 mL/min/1.73
GFR calc non Af Amer: 59 mL/min/1.73 — ABNORMAL LOW
Globulin, Total: 2.1 g/dL (ref 1.5–4.5)
Glucose: 84 mg/dL (ref 65–99)
Potassium: 4.6 mmol/L (ref 3.5–5.2)
Sodium: 138 mmol/L (ref 134–144)
Total Protein: 5.8 g/dL — ABNORMAL LOW (ref 6.0–8.5)

## 2020-08-30 ENCOUNTER — Telehealth: Payer: Self-pay | Admitting: Family Medicine

## 2020-08-30 ENCOUNTER — Other Ambulatory Visit: Payer: Self-pay | Admitting: Family

## 2020-08-30 ENCOUNTER — Telehealth: Payer: Self-pay

## 2020-08-30 MED ORDER — BENZONATATE 200 MG PO CAPS: 200.0000 mg | ORAL_CAPSULE | Freq: Three times a day (TID) | ORAL | 1 refills | Status: DC | PRN

## 2020-08-30 NOTE — Telephone Encounter (Signed)
I did not say this. I recommend stopping alcohol.

## 2020-08-30 NOTE — Telephone Encounter (Signed)
Should patient make an appointment to discuss her options?

## 2020-08-30 NOTE — Telephone Encounter (Signed)
Patient called and does not want [place] to speak with him

## 2020-08-30 NOTE — Telephone Encounter (Signed)
Spoke to patient and advised her to take him off HIPAA

## 2020-09-02 ENCOUNTER — Other Ambulatory Visit: Payer: Self-pay | Admitting: Family

## 2020-09-02 MED ORDER — DOXYCYCLINE HYCLATE 100 MG PO TABS: 100.0000 mg | ORAL_TABLET | Freq: Two times a day (BID) | ORAL | 0 refills | Status: DC

## 2020-09-03 ENCOUNTER — Ambulatory Visit (INDEPENDENT_AMBULATORY_CARE_PROVIDER_SITE_OTHER): Payer: Medicare Other

## 2020-09-03 ENCOUNTER — Other Ambulatory Visit: Payer: Self-pay

## 2020-09-03 DIAGNOSIS — I700 Atherosclerosis of aorta: Secondary | ICD-10-CM

## 2020-09-03 DIAGNOSIS — F32A Depression, unspecified: Secondary | ICD-10-CM

## 2020-09-03 DIAGNOSIS — F1010 Alcohol abuse, uncomplicated: Secondary | ICD-10-CM

## 2020-09-03 DIAGNOSIS — J449 Chronic obstructive pulmonary disease, unspecified: Secondary | ICD-10-CM | POA: Diagnosis not present

## 2020-09-03 DIAGNOSIS — J690 Pneumonitis due to inhalation of food and vomit: Secondary | ICD-10-CM | POA: Diagnosis not present

## 2020-09-03 DIAGNOSIS — E440 Moderate protein-calorie malnutrition: Secondary | ICD-10-CM

## 2020-09-03 DIAGNOSIS — M545 Low back pain, unspecified: Secondary | ICD-10-CM | POA: Diagnosis not present

## 2020-09-03 DIAGNOSIS — I509 Heart failure, unspecified: Secondary | ICD-10-CM

## 2020-09-03 DIAGNOSIS — I110 Hypertensive heart disease with heart failure: Secondary | ICD-10-CM

## 2020-09-03 DIAGNOSIS — G8929 Other chronic pain: Secondary | ICD-10-CM

## 2020-09-03 DIAGNOSIS — F419 Anxiety disorder, unspecified: Secondary | ICD-10-CM

## 2020-09-03 DIAGNOSIS — R296 Repeated falls: Secondary | ICD-10-CM

## 2020-09-09 ENCOUNTER — Telehealth: Payer: Self-pay | Admitting: *Deleted

## 2020-09-09 DIAGNOSIS — F1010 Alcohol abuse, uncomplicated: Secondary | ICD-10-CM

## 2020-09-09 DIAGNOSIS — G4700 Insomnia, unspecified: Secondary | ICD-10-CM

## 2020-09-09 DIAGNOSIS — F321 Major depressive disorder, single episode, moderate: Secondary | ICD-10-CM

## 2020-09-09 NOTE — Telephone Encounter (Signed)
VM from Amy PT w/ [facility] Pt had drank a 1/5 of liquor last few days, would like order for social worker and maybe a referral to psychologist for alchohol, she has had a dysfunctional family dynamic

## 2020-09-10 NOTE — Addendum Note (Signed)
Addended by: Evelina Dun A on: 09/10/2020 08:08 AM   Modules accepted: Orders

## 2020-09-10 NOTE — Telephone Encounter (Signed)
Referral placed.

## 2020-09-12 DIAGNOSIS — I700 Atherosclerosis of aorta: Secondary | ICD-10-CM | POA: Diagnosis not present

## 2020-09-12 DIAGNOSIS — R7402 Elevation of levels of lactic acid dehydrogenase (LDH): Secondary | ICD-10-CM | POA: Diagnosis not present

## 2020-09-12 DIAGNOSIS — R0602 Shortness of breath: Secondary | ICD-10-CM | POA: Diagnosis not present

## 2020-09-12 DIAGNOSIS — I959 Hypotension, unspecified: Secondary | ICD-10-CM | POA: Diagnosis not present

## 2020-09-12 DIAGNOSIS — N390 Urinary tract infection, site not specified: Secondary | ICD-10-CM | POA: Diagnosis not present

## 2020-09-12 DIAGNOSIS — Z20822 Contact with and (suspected) exposure to covid-19: Secondary | ICD-10-CM | POA: Diagnosis not present

## 2020-09-12 DIAGNOSIS — F10239 Alcohol dependence with withdrawal, unspecified: Secondary | ICD-10-CM | POA: Insufficient documentation

## 2020-09-12 DIAGNOSIS — R079 Chest pain, unspecified: Secondary | ICD-10-CM | POA: Diagnosis not present

## 2020-09-12 DIAGNOSIS — R0789 Other chest pain: Secondary | ICD-10-CM | POA: Diagnosis not present

## 2020-09-12 DIAGNOSIS — R531 Weakness: Secondary | ICD-10-CM | POA: Diagnosis not present

## 2020-09-12 DIAGNOSIS — R000 Tachycardia, unspecified: Secondary | ICD-10-CM | POA: Diagnosis not present

## 2020-09-12 DIAGNOSIS — F10939 Alcohol use, unspecified with withdrawal, unspecified: Secondary | ICD-10-CM | POA: Insufficient documentation

## 2020-09-13 ENCOUNTER — Other Ambulatory Visit: Payer: Self-pay | Admitting: Family

## 2020-09-13 DIAGNOSIS — F419 Anxiety disorder, unspecified: Secondary | ICD-10-CM

## 2020-09-13 DIAGNOSIS — R0600 Dyspnea, unspecified: Secondary | ICD-10-CM | POA: Diagnosis not present

## 2020-09-13 DIAGNOSIS — F1010 Alcohol abuse, uncomplicated: Secondary | ICD-10-CM

## 2020-09-15 ENCOUNTER — Telehealth: Payer: Self-pay

## 2020-09-15 NOTE — Chronic Care Management (AMB) (Signed)
  Chronic Care Management   Note  09/15/2020 Name: NAIROBI GUSTAFSON MRN: [id number] DOB: 1951-04-29  CRISTALLE ROHM is a 70 y.o. year old female who is a primary care patient of [name], FNP. BETHZAIDA BOORD is currently enrolled in care management services. An additional referral for LCSW was placed.   Follow up plan: Unsuccessful telephone outreach attempt made. A HIPAA compliant phone message was left for the patient providing contact information and requesting a return call.  The care management team will reach out to the patient again over the next 5 days.  If patient returns call to provider office, please advise to call [facility]  at [place], [place], [place], [place] [address] Direct Dial: [phone] [email or web address] Website: [email or web address]

## 2020-09-16 ENCOUNTER — Telehealth: Payer: Self-pay | Admitting: *Deleted

## 2020-09-16 NOTE — Telephone Encounter (Signed)
Transition Care Management Unsuccessful Follow-up Telephone Call  Date of discharge and from where:  09/16/20 - [place], [place]   Attempts:  1st Attempt  Reason for unsuccessful TCM follow-up call:  Left voice message

## 2020-09-17 ENCOUNTER — Telehealth: Payer: Self-pay

## 2020-09-17 DIAGNOSIS — R0902 Hypoxemia: Secondary | ICD-10-CM | POA: Diagnosis not present

## 2020-09-17 DIAGNOSIS — R404 Transient alteration of awareness: Secondary | ICD-10-CM | POA: Diagnosis not present

## 2020-09-17 DIAGNOSIS — E871 Hypo-osmolality and hyponatremia: Secondary | ICD-10-CM | POA: Insufficient documentation

## 2020-09-17 DIAGNOSIS — R4020 Unspecified coma: Secondary | ICD-10-CM | POA: Diagnosis not present

## 2020-09-17 DIAGNOSIS — J189 Pneumonia, unspecified organism: Secondary | ICD-10-CM | POA: Diagnosis not present

## 2020-09-17 DIAGNOSIS — J9601 Acute respiratory failure with hypoxia: Secondary | ICD-10-CM | POA: Diagnosis not present

## 2020-09-17 DIAGNOSIS — Z20822 Contact with and (suspected) exposure to covid-19: Secondary | ICD-10-CM | POA: Diagnosis not present

## 2020-09-17 DIAGNOSIS — J9600 Acute respiratory failure, unspecified whether with hypoxia or hypercapnia: Secondary | ICD-10-CM | POA: Diagnosis not present

## 2020-09-17 DIAGNOSIS — Z87891 Personal history of nicotine dependence: Secondary | ICD-10-CM | POA: Diagnosis not present

## 2020-09-17 DIAGNOSIS — A419 Sepsis, unspecified organism: Secondary | ICD-10-CM | POA: Diagnosis not present

## 2020-09-17 NOTE — Telephone Encounter (Signed)
FYI

## 2020-09-17 NOTE — Telephone Encounter (Signed)
Transition Care Management Unsuccessful Follow-up Telephone Call  Date of discharge and from where:  [facility] 09/16/20   Attempts:  2nd Attempt  Reason for unsuccessful TCM follow-up call:  Left voice message

## 2020-09-20 DIAGNOSIS — J9811 Atelectasis: Secondary | ICD-10-CM | POA: Diagnosis not present

## 2020-09-20 NOTE — Telephone Encounter (Signed)
Two attempts have been made to patient with no call back - this encounter will be closed.

## 2020-09-22 ENCOUNTER — Telehealth: Payer: Self-pay | Admitting: Family Medicine

## 2020-09-22 NOTE — Telephone Encounter (Signed)
Transition Care Management Unsuccessful Follow-up Telephone Call  Date of discharge and from where:  09/21/2020 from [facility].  Attempts:  1st Attempt  Reason for unsuccessful TCM follow-up call:  Left voice message

## 2020-09-23 ENCOUNTER — Other Ambulatory Visit: Payer: Medicare Other

## 2020-09-23 ENCOUNTER — Ambulatory Visit: Payer: Medicare Other | Admitting: Family

## 2020-09-23 NOTE — Telephone Encounter (Signed)
Transition Care Management Unsuccessful Follow-up Telephone Call  Date of discharge and from where:  09/21/2020 [facility]  Attempts:  2nd Attempt  Reason for unsuccessful TCM follow-up call:  Left voice message

## 2020-09-24 ENCOUNTER — Telehealth: Payer: Self-pay | Admitting: *Deleted

## 2020-09-24 DIAGNOSIS — G8929 Other chronic pain: Secondary | ICD-10-CM

## 2020-09-24 DIAGNOSIS — G894 Chronic pain syndrome: Secondary | ICD-10-CM

## 2020-09-24 DIAGNOSIS — F1010 Alcohol abuse, uncomplicated: Secondary | ICD-10-CM

## 2020-09-24 NOTE — Telephone Encounter (Signed)
Ok to order and I will sign if insurance will accept without my having seen pt.

## 2020-09-24 NOTE — Telephone Encounter (Signed)
TC from Amy PT w/ [facility] Requesting Rollator walker for patient, to decrease recent number of falls, she gets up during night to go to the bathroom If ok fax to [facility]

## 2020-09-24 NOTE — Telephone Encounter (Signed)
Transition Care Management Unsuccessful Follow-up Telephone Call  Date of discharge and from where:  09/21/20  [facility]   Attempts:  3rd Attempt  Reason for unsuccessful TCM follow-up call:  Unable to reach patient. Left message

## 2020-09-27 ENCOUNTER — Telehealth: Payer: Self-pay

## 2020-09-27 DIAGNOSIS — I10 Essential (primary) hypertension: Secondary | ICD-10-CM

## 2020-09-27 MED ORDER — AMLODIPINE-OLMESARTAN 10-40 MG PO TABS: 1.0000 | ORAL_TABLET | Freq: Every day | ORAL | 0 refills | Status: DC

## 2020-09-27 NOTE — Telephone Encounter (Signed)
Three calls have been made to patient with no call back -this encounter will be closed.

## 2020-09-27 NOTE — Telephone Encounter (Signed)
Refill sent.

## 2020-09-27 NOTE — Telephone Encounter (Signed)
  Prescription Request  09/27/2020  What is the name of the medication or equipment? amLODipine-olmesartan (AZOR) 10-40 MG tablet  Have you contacted your pharmacy to request a refill? (if applicable) no  Which pharmacy would you like this sent to? [facility] in [place]. DO NOT send to mail order   Patient notified that their request is being sent to the clinical staff for review and that they should receive a response within 2 business days.

## 2020-09-27 NOTE — Addendum Note (Signed)
Addended by: Antonietta Barcelona D on: 09/27/2020 01:11 PM   Modules accepted: Orders

## 2020-09-28 ENCOUNTER — Telehealth: Payer: Self-pay

## 2020-09-28 NOTE — Telephone Encounter (Signed)
Pt called stating that a Rx for Amlodpine was sent to the pharmacy for her yesterday but pt says she has never taken this medicine and has always taken Zocor for her HTN.. wants to know if there was a change in her medicine or if this was sent in by error.  Please advise and call patient.

## 2020-09-28 NOTE — Telephone Encounter (Signed)
Patient ment AZOR with is the name of her amlodipine olmsartan patient aware that is right medication she has been on.

## 2020-09-28 NOTE — Telephone Encounter (Signed)
Faxed to [place]

## 2020-09-28 NOTE — Chronic Care Management (AMB) (Signed)
  Chronic Care Management   Note  09/28/2020 Name: Stacy Dalton MRN: [id number] DOB: [birth year]  Stacy Dalton is a 70 y.o. year old female who is a primary care patient of [name], FNP. I reached out to [name] by phone today in response to a referral sent by [name]'s PCP, [name], FNP     [name] was given information about Chronic Care Management services today including:  1. CCM service includes personalized support from designated clinical staff supervised by her physician, including individualized plan of care and coordination with other care providers 2. 24/7 contact phone numbers for assistance for urgent and routine care needs. 3. Service will only be billed when office clinical staff spend 20 minutes or more in a month to coordinate care. 4. Only one practitioner may furnish and bill the service in a calendar month. 5. The patient may stop CCM services at any time (effective at the end of the month) by phone call to the office staff. 6. The patient will be responsible for cost sharing (co-pay) of up to 20% of the service fee (after annual deductible is met).  Patient agreed to services and verbal consent obtained.   Follow up plan: Telephone appointment with care management team member scheduled for:09/30/2020  Noreene Larsson, [place], [place], [place] [address] Direct Dial: [phone] [email or web address]: [email or web address]

## 2020-09-30 ENCOUNTER — Ambulatory Visit (INDEPENDENT_AMBULATORY_CARE_PROVIDER_SITE_OTHER): Payer: Medicare Other | Admitting: Licensed Clinical Social Worker

## 2020-09-30 DIAGNOSIS — F419 Anxiety disorder, unspecified: Secondary | ICD-10-CM

## 2020-09-30 DIAGNOSIS — E785 Hyperlipidemia, unspecified: Secondary | ICD-10-CM | POA: Diagnosis not present

## 2020-09-30 DIAGNOSIS — F321 Major depressive disorder, single episode, moderate: Secondary | ICD-10-CM

## 2020-09-30 DIAGNOSIS — J441 Chronic obstructive pulmonary disease with (acute) exacerbation: Secondary | ICD-10-CM | POA: Diagnosis not present

## 2020-09-30 DIAGNOSIS — E039 Hypothyroidism, unspecified: Secondary | ICD-10-CM | POA: Diagnosis not present

## 2020-09-30 DIAGNOSIS — I10 Essential (primary) hypertension: Secondary | ICD-10-CM

## 2020-09-30 DIAGNOSIS — K219 Gastro-esophageal reflux disease without esophagitis: Secondary | ICD-10-CM

## 2020-09-30 DIAGNOSIS — G4700 Insomnia, unspecified: Secondary | ICD-10-CM

## 2020-09-30 NOTE — Chronic Care Management (AMB) (Signed)
Chronic Care Management    Clinical Social Work Note  09/30/2020 Name: Stacy Dalton MRN: [id number] DOB: 12/04/1950  Stacy Dalton is a 70 y.o. year old female who is a primary care patient of [name], FNP. The CCM team was consulted to assist the patient with chronic disease management and/or care coordination needs related to: [name] .   Engaged with patient by telephone for initial visit in response to provider referral for social work chronic care management and care coordination services.   Consent to Services:  The patient was given the following information about Chronic Care Management services today, agreed to services, and gave verbal consent: 1. CCM service includes personalized support from designated clinical staff supervised by the primary care provider, including individualized plan of care and coordination with other care providers 2. 24/7 contact phone numbers for assistance for urgent and routine care needs. 3. Service will only be billed when office clinical staff spend 20 minutes or more in a month to coordinate care. 4. Only one practitioner may furnish and bill the service in a calendar month. 5.The patient may stop CCM services at any time (effective at the end of the month) by phone call to the office staff. 6. The patient will be responsible for cost sharing (co-pay) of up to 20% of the service fee (after annual deductible is met). Patient agreed to services and consent obtained.  Patient agreed to services and consent obtained.   Assessment: Review of patient past medical history, allergies, medications, and health status, including review of relevant consultants reports was performed today as part of a comprehensive evaluation and provision of chronic care management and care coordination services.     SDOH (Social Determinants of Health) assessments and interventions performed:  SDOH Interventions   Flowsheet Row Most Recent Value  SDOH  Interventions   Depression Interventions/Treatment  Medication       Advanced Directives Status: See Vynca application for related entries.  CCM Care Plan  Allergies  Allergen Reactions  . Toradol [Ketorolac Tromethamine] Shortness Of Breath  . Butalbital-Apap-Caffeine Other (See Comments)    jittery  . Demerol Swelling  . Esgic [Butalbital-Apap-Caffeine] Other (See Comments)    jittery  . Iodine Other (See Comments)    bp bottomed out per pt several hours later; unsure if pre medicated in past with cm; done in [place]    Outpatient Encounter Medications as of 09/30/2020  Medication Sig  . benzonatate (TESSALON) 200 MG capsule Take 1 capsule (200 mg total) by mouth 3 (three) times daily as needed.  [name] albuterol (PROVENTIL) (2.5 MG/3ML) 0.083% nebulizer solution Take 3 mLs (2.5 mg total) by nebulization every 6 (six) hours as needed for wheezing or shortness of breath.  [name] amLODipine-olmesartan (AZOR) 10-40 MG tablet Take 1 tablet by mouth daily.  [name] amoxicillin-clavulanate (AUGMENTIN) 875-125 MG tablet   . atorvastatin (LIPITOR) 20 MG tablet Take 1 tablet (20 mg total) by mouth daily. (Patient not taking: Reported on 08/11/2020)  . chlordiazePOXIDE (LIBRIUM) 25 MG capsule 66m PO TID x 1D, then 25-553mPO BID X 1D, then 25-5042mO QD X 1D  . doxycycline (VIBRA-TABS) 100 MG tablet Take 1 tablet (100 mg total) by mouth 2 (two) times daily.  . DULoxetine (CYMBALTA) 60 MG capsule Take 1 capsule (60 mg total) by mouth daily.  . fluticasone furoate-vilanterol (BREO ELLIPTA) 200-25 MCG/INH AEPB Inhale 1 puff into the lungs daily.  . Fluticasone-Salmeterol (ADVAIR) 100-50 MCG/DOSE AEPB   . folic  acid (FOLVITE) 1 MG tablet Take 1 tablet (1 mg total) by mouth daily.  . hydrOXYzine (VISTARIL) 25 MG capsule Take 1 capsule (25 mg total) by mouth 3 (three) times daily as needed for anxiety.  [name] levothyroxine (SYNTHROID) 50 MCG tablet Take 1 tablet (50 mcg total) by mouth daily before breakfast.  .  ondansetron (ZOFRAN) 4 MG tablet TAKE 1 TABLET(4 MG) BY MOUTH EVERY 8 HOURS AS NEEDED FOR NAUSEA (Patient taking differently: Take 4 mg by mouth every 8 (eight) hours as needed for nausea or vomiting.)  . temazepam (RESTORIL) 15 MG capsule Take 1 capsule (15 mg total) by mouth at bedtime.  . thiamine 100 MG tablet Take 1 tablet (100 mg total) by mouth daily for 60 doses.  . traMADol (ULTRAM) 50 MG tablet Take 2 tablets (100 mg total) by mouth every 12 (twelve) hours as needed (pain).  . Vitamin D, Ergocalciferol, (DRISDOL) 1.25 MG (50000 UNIT) CAPS capsule Take 1 capsule (50,000 Units total) by mouth every 7 (seven) days.   No facility-administered encounter medications on file as of 09/30/2020.    Patient Active Problem List   Diagnosis Date Noted  . Alcohol abuse 06/21/2020  . Multifocal pneumonia 09/19/2018  . Large bowel obstruction ([place])   . Small bowel obstruction ([place]) 07/20/2018  . Hypomagnesemia 07/20/2018  . Cough 03/14/2018  . Fatigue 03/14/2018  . Polypharmacy 02/23/2017  . Hypokalemia 02/23/2017  . Toxic metabolic encephalopathy 14/48/1856  . Anxiety 02/22/2017  . GERD (gastroesophageal reflux disease) 02/22/2017  . Pain medication agreement signed 10/10/2016  . Opioid dependence ([place]) 10/10/2016  . Status post colostomy takedown 04/12/2016  . Chronic constipation 12/03/2015  . Peritonitis with abscess of intestine ([place]) 11/29/2015  . COPD exacerbation ([place]) 09/20/2015  . Osteopenia 06/10/2014  . Vitamin D deficiency 06/10/2014  . Hypothyroidism   . Depression 11/18/2012  . Insomnia 11/18/2012  . Chronic pain syndrome 11/18/2012  . HLD (hyperlipidemia) 09/01/2012  . S/P colostomy ([place]) 04/19/2012  . Normocytic anemia 10/15/2011  . Hypertension 10/13/2011  . Chronic back pain 10/13/2011  . COPD (chronic obstructive pulmonary disease) ([place]) 10/09/2011    Conditions to be addressed/monitored: Monitor client management of anxiety and stress issues;   Care Plan :  LCSW Care Plan  Updates made by Katha Cabal, LCSW since 09/30/2020 12:00 AM    Problem: Emotional Distress     Goal: Emotional Health Supported   Start Date: 09/30/2020  Expected End Date: 12/30/2020  This Visit's Progress: Not on track  Priority: High  Note:   Current Barriers:  . Chronic Mental Health needs related to management of anxiety and stress issues . Suicidal Ideation/Homicidal Ideation: No . Hx of Alcohol Use/Abuse  Clinical Social Work Goal(s):  . patient will work with SW monthly by telephone or in person to reduce or manage symptoms related to anxiety and stress issues faced . Client will call LCSW in next 30 days as needed to discuss LCSW support with CCM program  Interventions: . Patient interviewed and appropriate assessments performed: PHQ-9; GAD-7 . 1:1 collaboration with  [name], FNP regarding development and update of comprehensive plan of care as evidenced by provider attestation and co-signature . Talked with client about sleeping issues of client . Talked with client about anxiety and stress issues of client . Talked with client about mobility of client . Talked with client about appetite (she spoke of decreased appetite) . Talked with client about her upcoming medical appointments . Talked with client about medication  procurement . Talked with client about pain issues faced . Talked with client about vision of client . Talked with client about mood of client (depression, anxiety) . Talked with client about relaxation techniques (watches TV) . Talked with client about family support  Patient Self Care Activities:  . Takes medications as prescribed  Patient Coping Strengths:  . Has some family support from her son, [name]  Patient Self Care Deficits:  . Hx Alcohol Use/Abuse  Patient Goals:  - spend time or talk with others every day - practice relaxation or meditation daily - keep a calendar with appointment dates  Follow Up  Plan: LCSW to call client on 11/04/20 to assess needs of client     Norva Riffle. MSW, LCSW Licensed Clinical Social Worker [facility] Care Management [phone]

## 2020-09-30 NOTE — Patient Instructions (Signed)
Visit Information  PATIENT GOALS: Goals Addressed            This Visit's Progress   . Manage Anxiety and Stress issues faced       Timeframe:  Short-Term Goal Priority:  High Progress: Not On Track Start Date:      09/30/20                       Expected End Date:       12/30/20                Follow Up Date 11/04/20   Manage My Emotions (Patient) Manage Anxiety and Stress Symptoms Faced    Why is this important?    When you are stressed, down or upset, your body reacts too.   For example, your blood pressure may get higher; you may have a headache or stomachache.   When your emotions get the best of you, your body's ability to fight off cold and flu gets weak.   These steps will help you manage your emotions.     Patient Self Care Activities:  . Takes medications as prescribed  Patient Coping Strengths:  . Has some family support from her son, [name]  Patient Self Care Deficits:  . Hx Alcohol Use/Abuse  Patient Goals:  - spend time or talk with others every day - practice relaxation or meditation daily - keep a calendar with appointment dates  Follow Up Plan: LCSW to call client on 11/04/20 to assess needs of client       Norva Riffle.Yashira Offenberger MSW, LCSW Licensed Clinical Social Worker [facility] Care Management [phone]

## 2020-10-04 ENCOUNTER — Encounter (HOSPITAL_COMMUNITY): Payer: Self-pay | Admitting: Emergency Medicine

## 2020-10-04 ENCOUNTER — Other Ambulatory Visit: Payer: Self-pay

## 2020-10-04 ENCOUNTER — Emergency Department (HOSPITAL_COMMUNITY)
Admission: EM | Admit: 2020-10-04 | Discharge: 2020-10-05 | Disposition: A | Discharge status: Home / Self Care | Payer: Medicare Other | Attending: Emergency Medicine | Admitting: Emergency Medicine

## 2020-10-04 DIAGNOSIS — Z87891 Personal history of nicotine dependence: Secondary | ICD-10-CM | POA: Diagnosis not present

## 2020-10-04 DIAGNOSIS — I10 Essential (primary) hypertension: Secondary | ICD-10-CM | POA: Insufficient documentation

## 2020-10-04 DIAGNOSIS — J449 Chronic obstructive pulmonary disease, unspecified: Secondary | ICD-10-CM | POA: Insufficient documentation

## 2020-10-04 DIAGNOSIS — G8929 Other chronic pain: Secondary | ICD-10-CM | POA: Diagnosis not present

## 2020-10-04 DIAGNOSIS — I959 Hypotension, unspecified: Secondary | ICD-10-CM | POA: Diagnosis not present

## 2020-10-04 DIAGNOSIS — M545 Low back pain, unspecified: Secondary | ICD-10-CM | POA: Insufficient documentation

## 2020-10-04 DIAGNOSIS — Z7951 Long term (current) use of inhaled steroids: Secondary | ICD-10-CM | POA: Insufficient documentation

## 2020-10-04 DIAGNOSIS — M549 Dorsalgia, unspecified: Secondary | ICD-10-CM | POA: Diagnosis not present

## 2020-10-04 DIAGNOSIS — E039 Hypothyroidism, unspecified: Secondary | ICD-10-CM | POA: Insufficient documentation

## 2020-10-04 DIAGNOSIS — Z79899 Other long term (current) drug therapy: Secondary | ICD-10-CM | POA: Diagnosis not present

## 2020-10-04 DIAGNOSIS — R0902 Hypoxemia: Secondary | ICD-10-CM | POA: Diagnosis not present

## 2020-10-04 DIAGNOSIS — M5459 Other low back pain: Secondary | ICD-10-CM | POA: Diagnosis not present

## 2020-10-04 NOTE — ED Triage Notes (Signed)
Pt c/o back pain and has been taking a lot of tramadol and drinking liquor.

## 2020-10-05 ENCOUNTER — Emergency Department (HOSPITAL_COMMUNITY)
Admission: EM | Admit: 2020-10-05 | Discharge: 2020-10-05 | Disposition: A | Discharge status: Home / Self Care | Payer: Medicare Other | Attending: Emergency Medicine | Admitting: Emergency Medicine

## 2020-10-05 ENCOUNTER — Emergency Department (HOSPITAL_COMMUNITY): Payer: Medicare Other

## 2020-10-05 ENCOUNTER — Other Ambulatory Visit: Payer: Self-pay

## 2020-10-05 ENCOUNTER — Encounter (HOSPITAL_COMMUNITY): Payer: Self-pay | Admitting: Emergency Medicine

## 2020-10-05 ENCOUNTER — Telehealth: Payer: Self-pay

## 2020-10-05 DIAGNOSIS — M549 Dorsalgia, unspecified: Secondary | ICD-10-CM | POA: Diagnosis not present

## 2020-10-05 DIAGNOSIS — R5381 Other malaise: Secondary | ICD-10-CM | POA: Diagnosis not present

## 2020-10-05 DIAGNOSIS — M545 Low back pain, unspecified: Secondary | ICD-10-CM | POA: Insufficient documentation

## 2020-10-05 DIAGNOSIS — E039 Hypothyroidism, unspecified: Secondary | ICD-10-CM | POA: Diagnosis not present

## 2020-10-05 DIAGNOSIS — J441 Chronic obstructive pulmonary disease with (acute) exacerbation: Secondary | ICD-10-CM | POA: Insufficient documentation

## 2020-10-05 DIAGNOSIS — Z87891 Personal history of nicotine dependence: Secondary | ICD-10-CM | POA: Insufficient documentation

## 2020-10-05 DIAGNOSIS — Y908 Blood alcohol level of 240 mg/100 ml or more: Secondary | ICD-10-CM | POA: Diagnosis not present

## 2020-10-05 DIAGNOSIS — Z79899 Other long term (current) drug therapy: Secondary | ICD-10-CM | POA: Diagnosis not present

## 2020-10-05 DIAGNOSIS — F10929 Alcohol use, unspecified with intoxication, unspecified: Secondary | ICD-10-CM

## 2020-10-05 DIAGNOSIS — F10129 Alcohol abuse with intoxication, unspecified: Secondary | ICD-10-CM | POA: Insufficient documentation

## 2020-10-05 DIAGNOSIS — I10 Essential (primary) hypertension: Secondary | ICD-10-CM | POA: Diagnosis not present

## 2020-10-05 DIAGNOSIS — F1020 Alcohol dependence, uncomplicated: Secondary | ICD-10-CM

## 2020-10-05 LAB — COMPREHENSIVE METABOLIC PANEL
ALT: 31 U/L (ref 0–44)
AST: 52 U/L — ABNORMAL HIGH (ref 15–41)
Albumin: 4.1 g/dL (ref 3.5–5.0)
Alkaline Phosphatase: 82 U/L (ref 38–126)
Anion gap: 17 — ABNORMAL HIGH (ref 5–15)
BUN: 29 mg/dL — ABNORMAL HIGH (ref 8–23)
CO2: 22 mmol/L (ref 22–32)
Calcium: 10.2 mg/dL (ref 8.9–10.3)
Chloride: 105 mmol/L (ref 98–111)
Creatinine, Ser: 0.96 mg/dL (ref 0.44–1.00)
GFR, Estimated: >60 mL/min (ref >60)
Glucose, Bld: 90 mg/dL (ref 70–99)
Potassium: 5 mmol/L (ref 3.5–5.1)
Sodium: 144 mmol/L (ref 135–145)
Total Bilirubin: 0.6 mg/dL (ref 0.3–1.2)
Total Protein: 7.4 g/dL (ref 6.5–8.1)

## 2020-10-05 LAB — CBC WITH DIFFERENTIAL/PLATELET
Abs Immature Granulocytes: 0.03 10*3/uL (ref 0.00–0.07)
Basophils Absolute: 0.1 10*3/uL (ref 0.0–0.1)
Basophils Relative: 1 %
Eosinophils Absolute: 0.3 10*3/uL (ref 0.0–0.5)
Eosinophils Relative: 5 %
HCT: 41.1 % (ref 36.0–46.0)
Hemoglobin: 13 g/dL (ref 12.0–15.0)
Immature Granulocytes: 1 %
Lymphocytes Relative: 42 %
Lymphs Abs: 2.2 10*3/uL (ref 0.7–4.0)
MCH: 27.2 pg (ref 26.0–34.0)
MCHC: 31.6 g/dL (ref 30.0–36.0)
MCV: 86 fL (ref 80.0–100.0)
Monocytes Absolute: 0.4 10*3/uL (ref 0.1–1.0)
Monocytes Relative: 8 %
Neutro Abs: 2.2 10*3/uL (ref 1.7–7.7)
Neutrophils Relative %: 43 %
Platelets: 471 10*3/uL — ABNORMAL HIGH (ref 150–400)
RBC: 4.78 MIL/uL (ref 3.87–5.11)
RDW: 17.4 % — ABNORMAL HIGH (ref 11.5–15.5)
WBC: 5.1 10*3/uL (ref 4.0–10.5)
nRBC: 0 % (ref 0.0–0.2)

## 2020-10-05 LAB — ETHANOL: Alcohol, Ethyl (B): 394 mg/dL (ref <10)

## 2020-10-05 NOTE — Telephone Encounter (Signed)
Please cancel Restoril and Ultram rx at pharmacy. I can no longer prescribe these.

## 2020-10-05 NOTE — Telephone Encounter (Signed)
CALLED AND CANCELED AT PHARMACY

## 2020-10-05 NOTE — Discharge Instructions (Addendum)
Your alcohol level today is very high.  This indicates that you are an alcoholic.  Avoid drinking alcohol.  Follow-up with your doctor for help and treatment for alcoholism.  The testing today, does not show any serious problems, with your back.  Try taking Tylenol 4 times a day to help with the pain.  Continue taking her pain medicine.  Use heat on the sore area 3 or 4 times a day.  See your doctor for a checkup next week.

## 2020-10-05 NOTE — ED Triage Notes (Signed)
Ems called out for mental episode when they arrived pt c/o of back pain. Pt was seen for the same last night. Pt has been taking tramadol and drinking liquor.

## 2020-10-05 NOTE — ED Provider Notes (Signed)
[facility] EMERGENCY DEPARTMENT Provider Note   CSN: [id number] Arrival date & time: 10/04/20  2218     History Chief Complaint  Patient presents with  . Back Pain    Stacy Dalton is a 70 y.o. female.  Patient is a 70 year old female with history of hypertension, GERD, COPD, chronic back pain, and alcohol abuse.  Patient presenting for evaluation of back pain.  Patient tells me she has had this for many years and denies any new injury or trauma.  She was apparently consuming alcohol and taking tramadol to deal with the pain.  She denies any bowel or bladder complaints.  She denies any fevers or chills.  Patient does not recall how she got here and is refusing testing.  Patient informs me she wants to go home.  The history is provided by the patient.       Past Medical History:  Diagnosis Date  . Chronic pain   . COPD (chronic obstructive pulmonary disease) ([place])    told has copd, no current inhaler use  . Cough   . Depression   . GERD (gastroesophageal reflux disease)   . Hypertension   . Hypothyroidism   . Insomnia   . Migraine   . Migraine   . Osteopenia   . Pain management   . Panic attacks   . Small bowel obstruction [facility])     Patient Active Problem List   Diagnosis Date Noted  . Alcohol abuse 06/21/2020  . Multifocal pneumonia 09/19/2018  . Large bowel obstruction ([place])   . Small bowel obstruction ([place]) 07/20/2018  . Hypomagnesemia 07/20/2018  . Cough 03/14/2018  . Fatigue 03/14/2018  . Polypharmacy 02/23/2017  . Hypokalemia 02/23/2017  . Toxic metabolic encephalopathy 09/38/1829  . Anxiety 02/22/2017  . GERD (gastroesophageal reflux disease) 02/22/2017  . Pain medication agreement signed 10/10/2016  . Opioid dependence ([place]) 10/10/2016  . Status post colostomy takedown 04/12/2016  . Chronic constipation 12/03/2015  . Peritonitis with abscess of intestine ([place]) 11/29/2015  . COPD exacerbation ([place]) 09/20/2015  . Osteopenia 06/10/2014  .  Vitamin D deficiency 06/10/2014  . Hypothyroidism   . Depression 11/18/2012  . Insomnia 11/18/2012  . Chronic pain syndrome 11/18/2012  . HLD (hyperlipidemia) 09/01/2012  . S/P colostomy ([place]) 04/19/2012  . Normocytic anemia 10/15/2011  . Hypertension 10/13/2011  . Chronic back pain 10/13/2011  . COPD (chronic obstructive pulmonary disease) ([place]) 10/09/2011    Past Surgical History:  Procedure Laterality Date  . ABDOMINAL HYSTERECTOMY    . COLON SURGERY    . COLOSTOMY CLOSURE  04/19/2012   Procedure: COLOSTOMY CLOSURE;  Surgeon: Adin Hector, MD;  Location: [facility] ORS;  Service: General;  Laterality: N/A;  Laparotomy, Resection and Closure of Colostomy  . COLOSTOMY TAKEDOWN N/A 04/12/2016   Procedure: [place] TAKEDOWN;  Surgeon: Johnathan Hausen, MD;  Location: [facility];  Service: General;  Laterality: N/A;  . INCONTINENCE SURGERY    . LAPAROTOMY  10/04/2011, colostomy also   Procedure: EXPLORATORY LAPAROTOMY;  Surgeon: Adin Hector, MD;  Location: [facility];  Service: General;  Laterality: N/A;  left partial colectomy with colostomy  . LAPAROTOMY  04/19/2012   Procedure: EXPLORATORY LAPAROTOMY;  Surgeon: Adin Hector, MD;  Location: [facility];  Service: General;  Laterality: N/A;  . LAPAROTOMY N/A 11/29/2015   Procedure: EXPLORATORY LAPAROTOMY; SUBTOTAL COLECTOMY WITH HARTMAN PROCEDURE AND END COLOSTOMY;  Surgeon: Johnathan Hausen, MD;  Location: [facility];  Service: General;  Laterality: N/A;  . TUBAL LIGATION    .  VENTRAL HERNIA REPAIR  04/19/2012   Procedure: HERNIA REPAIR VENTRAL ADULT;  Surgeon: Adin Hector, MD;  Location: [facility];  Service: General;  Laterality: N/A;     OB History    Gravida      Para      Term      Preterm      AB      Living  5     SAB      IAB      Ectopic      Multiple      Live Births              Family History  Problem Relation Age of Onset  . Heart disease Mother   . Hypertension Mother   . Cancer Sister        sinus  .  Diabetes Brother   . Heart disease Brother   . Thyroid disease Brother   . Cancer Sister        abdominal ?  . Thyroid disease Sister   . Cancer Other        GE junction adenocarcinoma  . Emphysema Father   . Stroke Father   . Hypertension Father   . Heart disease Father   . COPD Sister   . Thyroid disease Sister   . Thyroid disease Sister   . Diabetes Brother   . Thyroid disease Brother   . Thyroid disease Brother   . Hypertension Brother   . Post-traumatic stress disorder Brother   . COPD Brother   . Thyroid disease Brother   . Thyroid disease Brother   . Hypertension Brother   . Transient ischemic attack Brother     Social History   Tobacco Use  . Smoking status: Former Smoker    Packs/day: 1.50    Years: 20.00    Pack years: 30.00    Types: Cigarettes    Quit date: 10/04/1998    Years since quitting: 22.0  . Smokeless tobacco: Never Used  Vaping Use  . Vaping Use: Never used  Substance Use Topics  . Alcohol use: Yes  . Drug use: No    Home Medications Prior to Admission medications   Medication Sig Start Date End Date Taking? Authorizing Provider  benzonatate (TESSALON) 200 MG capsule Take 1 capsule (200 mg total) by mouth 3 (three) times daily as needed. 08/30/20   Evelina Dun A, FNP  albuterol (PROVENTIL) (2.5 MG/3ML) 0.083% nebulizer solution Take 3 mLs (2.5 mg total) by nebulization every 6 (six) hours as needed for wheezing or shortness of breath. 07/25/19   Evelina Dun A, FNP  amLODipine-olmesartan (AZOR) 10-40 MG tablet Take 1 tablet by mouth daily. 09/27/20   [name], FNP  amoxicillin-clavulanate (AUGMENTIN) 875-125 MG tablet  08/23/20   [provider]  atorvastatin (LIPITOR) 20 MG tablet Take 1 tablet (20 mg total) by mouth daily. Patient not taking: Reported on 08/11/2020 07/18/19 07/17/20  [name], FNP  chlordiazePOXIDE (LIBRIUM) 25 MG capsule 50mg  PO TID x 1D, then 25-50mg  PO BID X 1D, then 25-50mg  PO QD X 1D 08/11/20    Wyvonnia Dusky, MD  doxycycline (VIBRA-TABS) 100 MG tablet Take 1 tablet (100 mg total) by mouth 2 (two) times daily. 09/02/20   [name], FNP  DULoxetine (CYMBALTA) 60 MG capsule Take 1 capsule (60 mg total) by mouth daily. 04/27/20   Hawks, Alyse Low A, FNP  fluticasone furoate-vilanterol (BREO ELLIPTA) 200-25 MCG/INH AEPB Inhale 1 puff  into the lungs daily. 01/16/20   [name], FNP  Fluticasone-Salmeterol (ADVAIR) 100-50 MCG/DOSE AEPB  02/09/20   [provider]  folic acid (FOLVITE) 1 MG tablet Take 1 tablet (1 mg total) by mouth daily. 08/11/20   Wyvonnia Dusky, MD  hydrOXYzine (VISTARIL) 25 MG capsule Take 1 capsule (25 mg total) by mouth 3 (three) times daily as needed for anxiety. 08/27/20   [name], FNP  levothyroxine (SYNTHROID) 50 MCG tablet Take 1 tablet (50 mcg total) by mouth daily before breakfast. 06/22/20   Evelina Dun A, FNP  ondansetron (ZOFRAN) 4 MG tablet TAKE 1 TABLET(4 MG) BY MOUTH EVERY 8 HOURS AS NEEDED FOR NAUSEA Patient taking differently: Take 4 mg by mouth every 8 (eight) hours as needed for nausea or vomiting. 07/19/20   Evelina Dun A, FNP  temazepam (RESTORIL) 15 MG capsule Take 1 capsule (15 mg total) by mouth at bedtime. 06/21/20   [name], FNP  thiamine 100 MG tablet Take 1 tablet (100 mg total) by mouth daily for 60 doses. 08/11/20 10/10/20  Wyvonnia Dusky, MD  traMADol (ULTRAM) 50 MG tablet Take 2 tablets (100 mg total) by mouth every 12 (twelve) hours as needed (pain). 08/27/20   [name], FNP  Vitamin D, Ergocalciferol, (DRISDOL) 1.25 MG (50000 UNIT) CAPS capsule Take 1 capsule (50,000 Units total) by mouth every 7 (seven) days. 06/22/20   [name], FNP    Allergies    Toradol [ketorolac tromethamine], Butalbital-apap-caffeine, Demerol, Esgic [butalbital-apap-caffeine], and Iodine  Review of Systems   Review of Systems  All other systems reviewed and are negative.   Physical Exam Updated Vital  Signs BP 138/64   Pulse 85   Temp 97.6 F (36.4 C)   Resp 18   Wt 55 kg   SpO2 97%   BMI 22.91 kg/m   Physical Exam Vitals and nursing note reviewed.  Constitutional:      General: She is not in acute distress.    Appearance: Normal appearance. She is not ill-appearing, toxic-appearing or diaphoretic.  HENT:     Head: Normocephalic and atraumatic.  Pulmonary:     Effort: Pulmonary effort is normal.  Musculoskeletal:     Comments: There is tenderness to palpation in the soft tissues of the thoracic and lumbar regions.  There is no bony tenderness or step-off.  Skin:    General: Skin is warm and dry.  Neurological:     Mental Status: She is alert.     ED Results / Procedures / Treatments   Labs (all labs ordered are listed, but only abnormal results are displayed) Labs Reviewed - No data to display  EKG EKG Interpretation  Date/Time:  Monday October 04 2020 22:24:52 EDT Ventricular Rate:  81 PR Interval:    QRS Duration: 84 QT Interval:  375 QTC Calculation: 436 R Axis:   88 Text Interpretation: Atrial flutter with predominant 3:1 AV block Anterior infarct, old Confirmed by Veryl Speak [phone]) on 10/05/2020 12:03:56 AM   Radiology No results found.  Procedures Procedures   Medications Ordered in ED Medications - No data to display  ED Course  I have reviewed the triage vital signs and the nursing notes.  Pertinent labs & imaging results that were available during my care of the patient were reviewed by me and considered in my medical decision making (see chart for details).    MDM Rules/Calculators/A&P  Upon my evaluation, patient has declined testing.  She  has informed me she does not want to be here and is requesting to go home.  She has called her ex-husband who was apparently coming to pick her up.  Patient has requested discharge paperwork which will be provided.  Final Clinical Impression(s) / ED Diagnoses Final diagnoses:  None    Rx / DC  Orders ED Discharge Orders    None       Veryl Speak, MD 10/05/20 0040

## 2020-10-05 NOTE — Discharge Instructions (Addendum)
Continue medications as previously prescribed.  Follow-up with your primary doctor in the next week and return to the ER if symptoms significantly worsen or change.
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
## 2020-10-05 NOTE — ED Provider Notes (Signed)
[facility] EMERGENCY DEPARTMENT Provider Note   CSN: [id number] Arrival date & time: 10/05/20  1916     History Chief Complaint  Patient presents with  . Back Pain    Stacy Dalton is a 70 y.o. female.  HPI Presents for evaluation of lower back pain which she states has been hurting "a long time."  She is unable to quantify that period of time.  She was here last night but left prior to evaluation after complaining of the same problem.  She denies fever, vomiting, dizziness, weakness or change in bowel and urinary habits.  She has not tried anything for the problem.  No known trauma.  She has chronic pain and takes 4 tramadol pills every day.  There are no other known modifying factors    Past Medical History:  Diagnosis Date  . Chronic pain   . COPD (chronic obstructive pulmonary disease) ([place])    told has copd, no current inhaler use  . Cough   . Depression   . GERD (gastroesophageal reflux disease)   . Hypertension   . Hypothyroidism   . Insomnia   . Migraine   . Migraine   . Osteopenia   . Pain management   . Panic attacks   . Small bowel obstruction [facility])     Patient Active Problem List   Diagnosis Date Noted  . Alcohol abuse 06/21/2020  . Multifocal pneumonia 09/19/2018  . Large bowel obstruction ([place])   . Small bowel obstruction ([place]) 07/20/2018  . Hypomagnesemia 07/20/2018  . Cough 03/14/2018  . Fatigue 03/14/2018  . Polypharmacy 02/23/2017  . Hypokalemia 02/23/2017  . Toxic metabolic encephalopathy 67/89/3810  . Anxiety 02/22/2017  . GERD (gastroesophageal reflux disease) 02/22/2017  . Pain medication agreement signed 10/10/2016  . Opioid dependence ([place]) 10/10/2016  . Status post colostomy takedown 04/12/2016  . Chronic constipation 12/03/2015  . Peritonitis with abscess of intestine ([place]) 11/29/2015  . COPD exacerbation ([place]) 09/20/2015  . Osteopenia 06/10/2014  . Vitamin D deficiency 06/10/2014  . Hypothyroidism   . Depression 11/18/2012   . Insomnia 11/18/2012  . Chronic pain syndrome 11/18/2012  . HLD (hyperlipidemia) 09/01/2012  . S/P colostomy ([place]) 04/19/2012  . Normocytic anemia 10/15/2011  . Hypertension 10/13/2011  . Chronic back pain 10/13/2011  . COPD (chronic obstructive pulmonary disease) ([place]) 10/09/2011    Past Surgical History:  Procedure Laterality Date  . ABDOMINAL HYSTERECTOMY    . COLON SURGERY    . COLOSTOMY CLOSURE  04/19/2012   Procedure: COLOSTOMY CLOSURE;  Surgeon: Adin Hector, MD;  Location: [facility]S;  Service: General;  Laterality: N/A;  Laparotomy, Resection and Closure of Colostomy  . COLOSTOMY TAKEDOWN N/A 04/12/2016   Procedure: [place] TAKEDOWN;  Surgeon: Johnathan Hausen, MD;  Location: [facility];  Service: General;  Laterality: N/A;  . INCONTINENCE SURGERY    . LAPAROTOMY  10/04/2011, colostomy also   Procedure: EXPLORATORY LAPAROTOMY;  Surgeon: Adin Hector, MD;  Location: [facility];  Service: General;  Laterality: N/A;  left partial colectomy with colostomy  . LAPAROTOMY  04/19/2012   Procedure: EXPLORATORY LAPAROTOMY;  Surgeon: Adin Hector, MD;  Location: [facility];  Service: General;  Laterality: N/A;  . LAPAROTOMY N/A 11/29/2015   Procedure: EXPLORATORY LAPAROTOMY; SUBTOTAL COLECTOMY WITH HARTMAN PROCEDURE AND END COLOSTOMY;  Surgeon: Johnathan Hausen, MD;  Location: [facility];  Service: General;  Laterality: N/A;  . TUBAL LIGATION    . VENTRAL HERNIA REPAIR  04/19/2012   Procedure: HERNIA REPAIR VENTRAL  ADULT;  Surgeon: Adin Hector, MD;  Location: [facility];  Service: General;  Laterality: N/A;     OB History    Gravida      Para      Term      Preterm      AB      Living  5     SAB      IAB      Ectopic      Multiple      Live Births              Family History  Problem Relation Age of Onset  . Heart disease Mother   . Hypertension Mother   . Cancer Sister        sinus  . Diabetes Brother   . Heart disease Brother   . Thyroid disease Brother   .  Cancer Sister        abdominal ?  . Thyroid disease Sister   . Cancer Other        GE junction adenocarcinoma  . Emphysema Father   . Stroke Father   . Hypertension Father   . Heart disease Father   . COPD Sister   . Thyroid disease Sister   . Thyroid disease Sister   . Diabetes Brother   . Thyroid disease Brother   . Thyroid disease Brother   . Hypertension Brother   . Post-traumatic stress disorder Brother   . COPD Brother   . Thyroid disease Brother   . Thyroid disease Brother   . Hypertension Brother   . Transient ischemic attack Brother     Social History   Tobacco Use  . Smoking status: Former Smoker    Packs/day: 1.50    Years: 20.00    Pack years: 30.00    Types: Cigarettes    Quit date: 10/04/1998    Years since quitting: 22.0  . Smokeless tobacco: Never Used  Vaping Use  . Vaping Use: Never used  Substance Use Topics  . Alcohol use: Yes  . Drug use: No    Home Medications Prior to Admission medications   Medication Sig Start Date End Date Taking? Authorizing Provider  benzonatate (TESSALON) 200 MG capsule Take 1 capsule (200 mg total) by mouth 3 (three) times daily as needed. 08/30/20   Evelina Dun A, FNP  albuterol (PROVENTIL) (2.5 MG/3ML) 0.083% nebulizer solution Take 3 mLs (2.5 mg total) by nebulization every 6 (six) hours as needed for wheezing or shortness of breath. 07/25/19   Evelina Dun A, FNP  amLODipine-olmesartan (AZOR) 10-40 MG tablet Take 1 tablet by mouth daily. 09/27/20   [name], FNP  atorvastatin (LIPITOR) 20 MG tablet Take 1 tablet (20 mg total) by mouth daily. Patient not taking: Reported on 08/11/2020 07/18/19 07/17/20  [name], FNP  chlordiazePOXIDE (LIBRIUM) 25 MG capsule 50mg  PO TID x 1D, then 25-50mg  PO BID X 1D, then 25-50mg  PO QD X 1D 08/11/20   Wyvonnia Dusky, MD  DULoxetine (CYMBALTA) 60 MG capsule Take 1 capsule (60 mg total) by mouth daily. 04/27/20   Hawks, Alyse Low A, FNP  fluticasone furoate-vilanterol  (BREO ELLIPTA) 200-25 MCG/INH AEPB Inhale 1 puff into the lungs daily. 01/16/20   [name], FNP  Fluticasone-Salmeterol (ADVAIR) 100-50 MCG/DOSE AEPB  02/09/20   [provider]  folic acid (FOLVITE) 1 MG tablet Take 1 tablet (1 mg total) by mouth daily. 08/11/20   Wyvonnia Dusky, MD  hydrOXYzine (  VISTARIL) 25 MG capsule Take 1 capsule (25 mg total) by mouth 3 (three) times daily as needed for anxiety. 08/27/20   [name], FNP  levothyroxine (SYNTHROID) 50 MCG tablet Take 1 tablet (50 mcg total) by mouth daily before breakfast. 06/22/20   Evelina Dun A, FNP  ondansetron (ZOFRAN) 4 MG tablet TAKE 1 TABLET(4 MG) BY MOUTH EVERY 8 HOURS AS NEEDED FOR NAUSEA Patient taking differently: Take 4 mg by mouth every 8 (eight) hours as needed for nausea or vomiting. 07/19/20   [name], FNP  thiamine 100 MG tablet Take 1 tablet (100 mg total) by mouth daily for 60 doses. 08/11/20 10/10/20  Wyvonnia Dusky, MD  Vitamin D, Ergocalciferol, (DRISDOL) 1.25 MG (50000 UNIT) CAPS capsule Take 1 capsule (50,000 Units total) by mouth every 7 (seven) days. 06/22/20   [name], FNP    Allergies    Toradol [ketorolac tromethamine], Butalbital-apap-caffeine, Demerol, Esgic [butalbital-apap-caffeine], and Iodine  Review of Systems   Review of Systems  All other systems reviewed and are negative.   Physical Exam Updated Vital Signs BP 134/61   Pulse 80   Temp 98.1 F (36.7 C) (Oral)   Resp 13   Ht 5\' 1"  (1.549 m)   Wt 55 kg   SpO2 99%   BMI 22.91 kg/m   Physical Exam Vitals and nursing note reviewed.  Constitutional:      Appearance: She is well-developed.  HENT:     Head: Normocephalic and atraumatic.     Right Ear: External ear normal.     Left Ear: External ear normal.     Mouth/Throat:     Mouth: Mucous membranes are moist.  Eyes:     Conjunctiva/sclera: Conjunctivae normal.     Pupils: Pupils are equal, round, and reactive to light.  Neck:     Trachea:  Phonation normal.  Cardiovascular:     Rate and Rhythm: Normal rate and regular rhythm.     Heart sounds: Normal heart sounds.  Pulmonary:     Effort: Pulmonary effort is normal.     Breath sounds: Normal breath sounds.  Abdominal:     Palpations: Abdomen is soft.     Tenderness: There is no abdominal tenderness.  Musculoskeletal:        General: Normal range of motion.     Cervical back: Normal range of motion and neck supple.     Comments: She is able to sit up and move easily.  No visible or palpable abnormality of the upper or lower back.  Skin:    General: Skin is warm and dry.  Neurological:     Mental Status: She is alert and oriented to person, place, and time.     Cranial Nerves: No cranial nerve deficit.     Sensory: No sensory deficit.     Motor: No abnormal muscle tone.     Coordination: Coordination normal.  Psychiatric:        Mood and Affect: Mood normal.        Behavior: Behavior normal.     ED Results / Procedures / Treatments   Labs (all labs ordered are listed, but only abnormal results are displayed) Labs Reviewed  COMPREHENSIVE METABOLIC PANEL - Abnormal; Notable for the following components:      Result Value   BUN 29 (*)    AST 52 (*)    Anion gap 17 (*)    All other components within normal limits  ETHANOL - Abnormal;  Notable for the following components:   Alcohol, Ethyl (B) 394 (*)    All other components within normal limits  CBC WITH DIFFERENTIAL/PLATELET - Abnormal; Notable for the following components:   RDW 17.4 (*)    Platelets 471 (*)    All other components within normal limits  RAPID URINE DRUG SCREEN, HOSP PERFORMED  URINALYSIS, ROUTINE W REFLEX MICROSCOPIC    EKG None  Radiology DG Lumbar Spine Complete  Result Date: 10/05/2020 CLINICAL DATA:  Pain EXAM: LUMBAR SPINE - COMPLETE 4+ VIEW COMPARISON:  None. FINDINGS: There is no evidence of lumbar spine fracture. Alignment is normal. Intervertebral disc spaces are maintained.  IMPRESSION: No acute osseous abnormality Electronically Signed   By: Prudencio Pair M.D.   On: 10/05/2020 20:04    Procedures Procedures   Medications Ordered in ED Medications - No data to display  ED Course  I have reviewed the triage vital signs and the nursing notes.  Pertinent labs & imaging results that were available during my care of the patient were reviewed by me and considered in my medical decision making (see chart for details).    MDM Rules/Calculators/A&P                           Patient Vitals for the past 24 hrs:  BP Temp Temp src Pulse Resp SpO2 Height Weight  10/05/20 2100 134/61 -- -- 80 13 99 % -- --  10/05/20 2030 (!) 151/79 -- -- 93 19 100 % -- --  10/05/20 2015 (!) 143/67 -- -- 85 17 94 % -- --  10/05/20 2000 (!) 126/56 -- -- 83 11 99 % -- --  10/05/20 1932 (!) 132/59 -- -- 81 -- -- -- --  10/05/20 1921 136/67 98.1 F (36.7 C) Oral 86 18 100 % -- --  10/05/20 1919 -- -- -- -- -- -- 5\' 1"  (1.549 m) 55 kg    11:47 PM Reevaluation with update and discussion. After initial assessment and treatment, an updated evaluation reveals patient sitting on edge of the bed stating she wants to go home and not complete treatment here.  Test results have returned with the exception of urinalysis.  Findings were discussed with her and all questions were answered. Daleen Bo   Medical Decision Making:  This patient is presenting for evaluation of back pain, which does require a range of treatment options, and is a complaint that involves a moderate risk of morbidity and mortality. The differential diagnoses include muscle strain, injury, degenerative joint disease. I decided to review old records, and in summary elderly female, suspected to be drinking, presenting now for second visit for back pain within a couple of days.  No known trauma..  I did not require additional historical information from anyone.  Clinical Laboratory Tests Ordered, included CBC, Metabolic panel  and Alcohol level. Review indicates essentially normal except elevated alcohol level.. Radiologic Tests Ordered, included lumbar x-ray.  I independently Visualized: Radiographic images, which show no acute abnormalities    Critical Interventions-clinical evaluation, laboratory testing, radiography, observation, patient refused to give urine sample.  After These Interventions, the Patient was reevaluated and was found stable for discharge.  Patient with significant alcohol intoxication and nonspecific back pain.  CRITICAL CARE-no Performed by: Daleen Bo  Nursing Notes Reviewed/ Care Coordinated Applicable Imaging Reviewed Interpretation of Laboratory Data incorporated into ED treatment  The patient appears reasonably screened and/or stabilized for discharge and I doubt any other medical  condition or other [facility] requiring further screening, evaluation, or treatment in the ED at this time prior to discharge.  Plan: Home Medications-continue usual medication; Home Treatments-avoid alcohol; return here if the recommended treatment, does not improve the symptoms; Recommended follow up-PCP, PRN     Final Clinical Impression(s) / ED Diagnoses Final diagnoses:  Low back pain without sciatica, unspecified back pain laterality, unspecified chronicity  Alcoholic intoxication with complication ([place])  Alcoholism ([place])    Rx / DC Orders ED Discharge Orders    None       Daleen Bo, MD 10/05/20 2352

## 2020-10-05 NOTE — Telephone Encounter (Signed)
SON AWARE AND VERBALIZED UNDERSTANDING

## 2020-10-08 ENCOUNTER — Inpatient Hospital Stay (HOSPITAL_COMMUNITY)
Admission: EM | Admit: 2020-10-08 | Discharge: 2020-10-11 | DRG: 897 | Disposition: A | Discharge status: Home / Self Care | Payer: Medicare Other | Attending: Internal Medicine | Admitting: Internal Medicine

## 2020-10-08 ENCOUNTER — Inpatient Hospital Stay (HOSPITAL_COMMUNITY): Payer: Medicare Other

## 2020-10-08 ENCOUNTER — Telehealth: Payer: Self-pay

## 2020-10-08 ENCOUNTER — Encounter (HOSPITAL_COMMUNITY): Payer: Self-pay

## 2020-10-08 ENCOUNTER — Other Ambulatory Visit: Payer: Self-pay

## 2020-10-08 DIAGNOSIS — M549 Dorsalgia, unspecified: Secondary | ICD-10-CM | POA: Diagnosis present

## 2020-10-08 DIAGNOSIS — Z823 Family history of stroke: Secondary | ICD-10-CM

## 2020-10-08 DIAGNOSIS — Z833 Family history of diabetes mellitus: Secondary | ICD-10-CM

## 2020-10-08 DIAGNOSIS — J449 Chronic obstructive pulmonary disease, unspecified: Secondary | ICD-10-CM | POA: Diagnosis present

## 2020-10-08 DIAGNOSIS — Z8349 Family history of other endocrine, nutritional and metabolic diseases: Secondary | ICD-10-CM

## 2020-10-08 DIAGNOSIS — Z7951 Long term (current) use of inhaled steroids: Secondary | ICD-10-CM

## 2020-10-08 DIAGNOSIS — Z79899 Other long term (current) drug therapy: Secondary | ICD-10-CM

## 2020-10-08 DIAGNOSIS — E039 Hypothyroidism, unspecified: Secondary | ICD-10-CM | POA: Diagnosis present

## 2020-10-08 DIAGNOSIS — F10239 Alcohol dependence with withdrawal, unspecified: Secondary | ICD-10-CM | POA: Diagnosis not present

## 2020-10-08 DIAGNOSIS — G894 Chronic pain syndrome: Secondary | ICD-10-CM | POA: Diagnosis present

## 2020-10-08 DIAGNOSIS — F410 Panic disorder [episodic paroxysmal anxiety] without agoraphobia: Secondary | ICD-10-CM | POA: Diagnosis present

## 2020-10-08 DIAGNOSIS — E871 Hypo-osmolality and hyponatremia: Secondary | ICD-10-CM | POA: Diagnosis present

## 2020-10-08 DIAGNOSIS — K219 Gastro-esophageal reflux disease without esophagitis: Secondary | ICD-10-CM | POA: Diagnosis present

## 2020-10-08 DIAGNOSIS — Y906 Blood alcohol level of 120-199 mg/100 ml: Secondary | ICD-10-CM | POA: Diagnosis present

## 2020-10-08 DIAGNOSIS — R7989 Other specified abnormal findings of blood chemistry: Secondary | ICD-10-CM | POA: Diagnosis not present

## 2020-10-08 DIAGNOSIS — M545 Low back pain, unspecified: Secondary | ICD-10-CM | POA: Diagnosis present

## 2020-10-08 DIAGNOSIS — G43909 Migraine, unspecified, not intractable, without status migrainosus: Secondary | ICD-10-CM | POA: Diagnosis present

## 2020-10-08 DIAGNOSIS — R296 Repeated falls: Secondary | ICD-10-CM | POA: Diagnosis present

## 2020-10-08 DIAGNOSIS — Z888 Allergy status to other drugs, medicaments and biological substances status: Secondary | ICD-10-CM

## 2020-10-08 DIAGNOSIS — Z825 Family history of asthma and other chronic lower respiratory diseases: Secondary | ICD-10-CM

## 2020-10-08 DIAGNOSIS — E785 Hyperlipidemia, unspecified: Secondary | ICD-10-CM | POA: Diagnosis present

## 2020-10-08 DIAGNOSIS — F1020 Alcohol dependence, uncomplicated: Secondary | ICD-10-CM

## 2020-10-08 DIAGNOSIS — M546 Pain in thoracic spine: Secondary | ICD-10-CM | POA: Diagnosis not present

## 2020-10-08 DIAGNOSIS — D649 Anemia, unspecified: Secondary | ICD-10-CM | POA: Diagnosis present

## 2020-10-08 DIAGNOSIS — I10 Essential (primary) hypertension: Secondary | ICD-10-CM | POA: Diagnosis present

## 2020-10-08 DIAGNOSIS — M8580 Other specified disorders of bone density and structure, unspecified site: Secondary | ICD-10-CM | POA: Diagnosis present

## 2020-10-08 DIAGNOSIS — F10229 Alcohol dependence with intoxication, unspecified: Secondary | ICD-10-CM | POA: Diagnosis present

## 2020-10-08 DIAGNOSIS — Z79890 Hormone replacement therapy (postmenopausal): Secondary | ICD-10-CM

## 2020-10-08 DIAGNOSIS — F32A Depression, unspecified: Secondary | ICD-10-CM | POA: Diagnosis present

## 2020-10-08 DIAGNOSIS — Z20822 Contact with and (suspected) exposure to covid-19: Secondary | ICD-10-CM | POA: Diagnosis present

## 2020-10-08 DIAGNOSIS — Z87891 Personal history of nicotine dependence: Secondary | ICD-10-CM

## 2020-10-08 DIAGNOSIS — G8929 Other chronic pain: Secondary | ICD-10-CM | POA: Diagnosis present

## 2020-10-08 DIAGNOSIS — Z8249 Family history of ischemic heart disease and other diseases of the circulatory system: Secondary | ICD-10-CM

## 2020-10-08 DIAGNOSIS — Z885 Allergy status to narcotic agent status: Secondary | ICD-10-CM

## 2020-10-08 LAB — COMPREHENSIVE METABOLIC PANEL WITH GFR
ALT: 31 U/L (ref 0–44)
AST: 50 U/L — ABNORMAL HIGH (ref 15–41)
Albumin: 3.7 g/dL (ref 3.5–5.0)
Alkaline Phosphatase: 74 U/L (ref 38–126)
Anion gap: 12 (ref 5–15)
BUN: 17 mg/dL (ref 8–23)
CO2: 26 mmol/L (ref 22–32)
Calcium: 9.1 mg/dL (ref 8.9–10.3)
Chloride: 102 mmol/L (ref 98–111)
Creatinine, Ser: 1.08 mg/dL — ABNORMAL HIGH (ref 0.44–1.00)
GFR, Estimated: 55 mL/min — ABNORMAL LOW
Glucose, Bld: 107 mg/dL — ABNORMAL HIGH (ref 70–99)
Potassium: 3.5 mmol/L (ref 3.5–5.1)
Sodium: 140 mmol/L (ref 135–145)
Total Bilirubin: 0.4 mg/dL (ref 0.3–1.2)
Total Protein: 6.2 g/dL — ABNORMAL LOW (ref 6.5–8.1)

## 2020-10-08 LAB — ACETAMINOPHEN LEVEL: Acetaminophen (Tylenol), Serum: <10 ug/mL — ABNORMAL LOW (ref 10–30)

## 2020-10-08 LAB — CBC
HCT: 35.7 % — ABNORMAL LOW (ref 36.0–46.0)
Hemoglobin: 11.6 g/dL — ABNORMAL LOW (ref 12.0–15.0)
MCH: 27.5 pg (ref 26.0–34.0)
MCHC: 32.5 g/dL (ref 30.0–36.0)
MCV: 84.6 fL (ref 80.0–100.0)
Platelets: 336 K/uL (ref 150–400)
RBC: 4.22 MIL/uL (ref 3.87–5.11)
RDW: 16.9 % — ABNORMAL HIGH (ref 11.5–15.5)
WBC: 4.7 K/uL (ref 4.0–10.5)
nRBC: 0 % (ref 0.0–0.2)

## 2020-10-08 LAB — COMPREHENSIVE METABOLIC PANEL
ALT: 28 U/L (ref 0–44)
AST: 46 U/L — ABNORMAL HIGH (ref 15–41)
Albumin: 3.3 g/dL — ABNORMAL LOW (ref 3.5–5.0)
Alkaline Phosphatase: 68 U/L (ref 38–126)
Anion gap: 9 (ref 5–15)
BUN: 16 mg/dL (ref 8–23)
CO2: 23 mmol/L (ref 22–32)
Calcium: 8.5 mg/dL — ABNORMAL LOW (ref 8.9–10.3)
Chloride: 104 mmol/L (ref 98–111)
Creatinine, Ser: 0.83 mg/dL (ref 0.44–1.00)
GFR, Estimated: >60 mL/min (ref >60)
Glucose, Bld: 102 mg/dL — ABNORMAL HIGH (ref 70–99)
Potassium: 3.7 mmol/L (ref 3.5–5.1)
Sodium: 136 mmol/L (ref 135–145)
Total Bilirubin: 0.8 mg/dL (ref 0.3–1.2)
Total Protein: 5.7 g/dL — ABNORMAL LOW (ref 6.5–8.1)

## 2020-10-08 LAB — IRON AND TIBC
Iron: 81 ug/dL (ref 28–170)
Saturation Ratios: 22 % (ref 10.4–31.8)
TIBC: 371 ug/dL (ref 250–450)
UIBC: 290 ug/dL

## 2020-10-08 LAB — RAPID URINE DRUG SCREEN, HOSP PERFORMED
Amphetamines: NOT DETECTED
Barbiturates: NOT DETECTED
Benzodiazepines: NOT DETECTED
Cocaine: NOT DETECTED
Opiates: NOT DETECTED
Tetrahydrocannabinol: NOT DETECTED

## 2020-10-08 LAB — RETICULOCYTES
Immature Retic Fract: 7.1 % (ref 2.3–15.9)
RBC.: 3.93 MIL/uL (ref 3.87–5.11)
Retic Count, Absolute: 23.2 10*3/uL (ref 19.0–186.0)
Retic Ct Pct: 0.6 % (ref 0.4–3.1)

## 2020-10-08 LAB — RESP PANEL BY RT-PCR (FLU A&B, COVID) ARPGX2
Influenza A by PCR: NEGATIVE
Influenza B by PCR: NEGATIVE
SARS Coronavirus 2 by RT PCR: NEGATIVE

## 2020-10-08 LAB — PHOSPHORUS: Phosphorus: 3.4 mg/dL (ref 2.5–4.6)

## 2020-10-08 LAB — FOLATE: Folate: 11.4 ng/mL (ref >5.9)

## 2020-10-08 LAB — VITAMIN B12: Vitamin B-12: 398 pg/mL (ref 180–914)

## 2020-10-08 LAB — MAGNESIUM
Magnesium: 1.3 mg/dL — ABNORMAL LOW (ref 1.7–2.4)
Magnesium: 1.9 mg/dL (ref 1.7–2.4)

## 2020-10-08 LAB — SALICYLATE LEVEL: Salicylate Lvl: <7 mg/dL — ABNORMAL LOW (ref 7.0–30.0)

## 2020-10-08 LAB — ETHANOL: Alcohol, Ethyl (B): 182 mg/dL — ABNORMAL HIGH (ref <10)

## 2020-10-08 LAB — TSH: TSH: 4.191 u[IU]/mL (ref 0.350–4.500)

## 2020-10-08 LAB — FERRITIN: Ferritin: 67 ng/mL (ref 11–307)

## 2020-10-08 LAB — AMMONIA: Ammonia: 15 umol/L (ref 9–35)

## 2020-10-08 MED ORDER — FOLIC ACID 1 MG PO TABS
1.0000 mg | ORAL_TABLET | Freq: Every day | ORAL | Status: DC
  Administered 2020-10-08 – 2020-10-11 (×4): 1 mg via ORAL
  Filled 2020-10-08 (×4): qty 1

## 2020-10-08 MED ORDER — CHLORDIAZEPOXIDE HCL 25 MG PO CAPS
25.0000 mg | ORAL_CAPSULE | Freq: Four times a day (QID) | ORAL | Status: DC
  Filled 2020-10-08: qty 1

## 2020-10-08 MED ORDER — TRAMADOL HCL 50 MG PO TABS
50.0000 mg | ORAL_TABLET | Freq: Three times a day (TID) | ORAL | Status: DC | PRN
  Administered 2020-10-08 – 2020-10-11 (×7): 50 mg via ORAL
  Filled 2020-10-08 (×8): qty 1

## 2020-10-08 MED ORDER — CHLORDIAZEPOXIDE HCL 25 MG PO CAPS: 25.0000 mg | ORAL_CAPSULE | Freq: Four times a day (QID) | ORAL | Status: DC

## 2020-10-08 MED ORDER — THIAMINE HCL 100 MG PO TABS
100.0000 mg | ORAL_TABLET | Freq: Every day | ORAL | Status: DC
  Administered 2020-10-08 – 2020-10-11 (×4): 100 mg via ORAL
  Filled 2020-10-08 (×4): qty 1

## 2020-10-08 MED ORDER — THIAMINE HCL 100 MG/ML IJ SOLN: 100.0000 mg | Freq: Every day | INTRAMUSCULAR | Status: DC

## 2020-10-08 MED ORDER — LORAZEPAM 1 MG PO TABS
0.0000 mg | ORAL_TABLET | Freq: Two times a day (BID) | ORAL | Status: DC
Start: 2020-10-10 — End: 2020-10-08

## 2020-10-08 MED ORDER — ENOXAPARIN SODIUM 40 MG/0.4ML ~~LOC~~ SOLN
40.0000 mg | Freq: Every day | SUBCUTANEOUS | Status: DC
  Administered 2020-10-08 – 2020-10-11 (×4): 40 mg via SUBCUTANEOUS
  Filled 2020-10-08 (×4): qty 0.4

## 2020-10-08 MED ORDER — LORAZEPAM 1 MG PO TABS: 0.0000 mg | ORAL_TABLET | Freq: Four times a day (QID) | ORAL | Status: DC

## 2020-10-08 MED ORDER — LORAZEPAM 2 MG/ML IJ SOLN
0.5000 mg | Freq: Once | INTRAMUSCULAR | Status: AC
  Administered 2020-10-08: 0.5 mg via INTRAVENOUS
  Filled 2020-10-08: qty 1

## 2020-10-08 MED ORDER — MAGNESIUM SULFATE 2 GM/50ML IV SOLN
2.0000 g | Freq: Once | INTRAVENOUS | Status: AC
  Administered 2020-10-08: 2 g via INTRAVENOUS
  Filled 2020-10-08: qty 50

## 2020-10-08 MED ORDER — SODIUM CHLORIDE 0.9 % IV BOLUS
500.0000 mL | Freq: Once | INTRAVENOUS | Status: AC
  Administered 2020-10-08: 500 mL via INTRAVENOUS

## 2020-10-08 MED ORDER — LIDOCAINE 5 % EX PTCH
1.0000 | MEDICATED_PATCH | CUTANEOUS | Status: DC
  Administered 2020-10-08 – 2020-10-11 (×4): 1 via TRANSDERMAL
  Filled 2020-10-08 (×4): qty 1

## 2020-10-08 MED ORDER — LORAZEPAM 1 MG PO TABS
1.0000 mg | ORAL_TABLET | ORAL | Status: AC | PRN
  Administered 2020-10-08 – 2020-10-09 (×10): 1 mg via ORAL
  Administered 2020-10-09: 2 mg via ORAL
  Administered 2020-10-09 (×2): 1 mg via ORAL
  Administered 2020-10-09: 3 mg via ORAL
  Administered 2020-10-09 – 2020-10-10 (×3): 1 mg via ORAL
  Administered 2020-10-10: 2 mg via ORAL
  Filled 2020-10-08 (×2): qty 1
  Filled 2020-10-08: qty 3
  Filled 2020-10-08 (×9): qty 1
  Filled 2020-10-08: qty 2
  Filled 2020-10-08 (×4): qty 1
  Filled 2020-10-08: qty 3
  Filled 2020-10-08: qty 1

## 2020-10-08 MED ORDER — VITAMIN B-12 100 MCG PO TABS
100.0000 ug | ORAL_TABLET | Freq: Every day | ORAL | Status: DC
  Administered 2020-10-08 – 2020-10-11 (×4): 100 ug via ORAL
  Filled 2020-10-08 (×4): qty 1

## 2020-10-08 MED ORDER — SODIUM CHLORIDE 0.9 % IV SOLN: Freq: Once | INTRAVENOUS | Status: AC

## 2020-10-08 MED ORDER — LORAZEPAM 2 MG/ML IJ SOLN
0.0000 mg | Freq: Four times a day (QID) | INTRAMUSCULAR | Status: DC
  Administered 2020-10-08: 2 mg via INTRAVENOUS
  Filled 2020-10-08: qty 1

## 2020-10-08 MED ORDER — LORAZEPAM 2 MG/ML IJ SOLN: 0.0000 mg | Freq: Two times a day (BID) | INTRAMUSCULAR | Status: DC

## 2020-10-08 MED ORDER — LORAZEPAM 2 MG/ML IJ SOLN
1.0000 mg | INTRAMUSCULAR | Status: AC | PRN
  Administered 2020-10-10: 2 mg via INTRAVENOUS
  Administered 2020-10-10 (×2): 1 mg via INTRAVENOUS
  Filled 2020-10-08 (×3): qty 1

## 2020-10-08 NOTE — Progress Notes (Signed)
Patient seen and examined at.  HPI reviewed. Patient report feeling anxious, she has tremors, she still complaining of back pain and subjective weakness of her legs.  Nurse at bedside will check CIWA score and give Ativan as needed.  70 year old with history of alcohol dependence, COPD chronic back pain presents because she wants to be detoxed from alcohol.  1-Alcohol dependence with withdrawal: -Continue  with CIWA protocol. -GI worker consulted.  2-Hypomagnesemia: Replace  3-Chronic back pain; -Family was concerned about patient use of pain medication with a reportedly she overdosed recently and had to be given Narcan. -I will start her low-dose tramadol to avoid further opioid withdrawal in the setting of alcohol withdrawal.  She will need to be weaned off tramadol -Lumbar spine from 4/5 no acute osseous abnormality -Her lower extremity weakness, check TSH and B12.  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  4-Mild transaminases: Secondary to alcohol use  Niel Hummer, MD.

## 2020-10-08 NOTE — ED Notes (Signed)
Attempted to give report to RN receiving pt. RN stated charge nurse is not on the unit at this time and has not assigned the pt to a nurse yet. Will call this RN back.

## 2020-10-08 NOTE — ED Triage Notes (Signed)
Pt reports detox from alcohol and last drink was vodka and was today. Pt states she wants to stop drinking alcohol

## 2020-10-08 NOTE — ED Notes (Signed)
Report attempted.  REquested to call back in 5 min.

## 2020-10-08 NOTE — H&P (Signed)
History and Physical    Stacy Dalton EHM:[id number] DOB: 1950-12-04 DOA: 10/08/2020  PCP: [name], FNP Patient coming from: Home  Chief Complaint: Alcohol detox  HPI: Stacy Dalton is a 70 y.o. female with medical history significant of alcohol dependence, COPD, chronic pain, hypertension, hyperlipidemia, hypothyroidism, migraine, panic attacks, anxiety, depression requesting alcohol detox.  Labs done in the ED showing WBC 4.7, hemoglobin 11.6, MCV 84.6, platelet count 336K.  Sodium 140, potassium 3.5, chloride 102, bicarb 26, BUN 17, creatinine 1.0, glucose 107.  AST mildly elevated at 50.  Remainder of LFTs normal.  Blood ethanol level 182.  UDS negative.  Acetaminophen and salicylate levels undetectable.  Magnesium 1.3.  Ammonia level normal.  Screening Covid and influenza PCR pending. Initially in the ED, patient was not displaying any signs of withdrawal but later became hypertensive, tachycardic, and tremulous.  CIWA protocol initiated.  Patient states she is here because she has been drinking a lot.  States she has had back pain for the past 20 years for which she is seen by her primary care physician and recently also saw a chiropractor.  She feels pain in her entire back.  Reports weakness in her legs due to her back pain but denies any numbness or tingling in the legs or saddle anesthesia.  States for the past 1 year she has been drinking a lot to numb the pain.  States she is on tramadol for her back pain and "the pills keep disappearing."  She reports drinking vodka daily.  Per daughter-in-law at bedside, patient is drinking close to half a gallon of vodka daily.  States she was previously prescribed Librium to help her quit drinking but was not able to tolerate it as she is also taking pain pills and became unresponsive and had to be given Narcan.  She is not vaccinated against COVID.  Denies fevers, cough, or shortness of breath.  Review of Systems:  All systems reviewed  and apart from history of presenting illness, are negative.  Past Medical History:  Diagnosis Date  . Chronic pain   . COPD (chronic obstructive pulmonary disease) ([place])    told has copd, no current inhaler use  . Cough   . Depression   . GERD (gastroesophageal reflux disease)   . Hypertension   . Hypothyroidism   . Insomnia   . Migraine   . Migraine   . Osteopenia   . Pain management   . Panic attacks   . Small bowel obstruction [facility])     Past Surgical History:  Procedure Laterality Date  . ABDOMINAL HYSTERECTOMY    . COLON SURGERY    . COLOSTOMY CLOSURE  04/19/2012   Procedure: COLOSTOMY CLOSURE;  Surgeon: Adin Hector, MD;  Location: [facility];  Service: General;  Laterality: N/A;  Laparotomy, Resection and Closure of Colostomy  . COLOSTOMY TAKEDOWN N/A 04/12/2016   Procedure: [place] TAKEDOWN;  Surgeon: Johnathan Hausen, MD;  Location: [facility];  Service: General;  Laterality: N/A;  . INCONTINENCE SURGERY    . LAPAROTOMY  10/04/2011, colostomy also   Procedure: EXPLORATORY LAPAROTOMY;  Surgeon: Adin Hector, MD;  Location: [facility];  Service: General;  Laterality: N/A;  left partial colectomy with colostomy  . LAPAROTOMY  04/19/2012   Procedure: EXPLORATORY LAPAROTOMY;  Surgeon: Adin Hector, MD;  Location: [facility];  Service: General;  Laterality: N/A;  . LAPAROTOMY N/A 11/29/2015   Procedure: EXPLORATORY LAPAROTOMY; SUBTOTAL COLECTOMY WITH HARTMAN PROCEDURE AND END COLOSTOMY;  Surgeon: Johnathan Hausen, MD;  Location: [facility];  Service: General;  Laterality: N/A;  . TUBAL LIGATION    . VENTRAL HERNIA REPAIR  04/19/2012   Procedure: HERNIA REPAIR VENTRAL ADULT;  Surgeon: Adin Hector, MD;  Location: [facility];  Service: General;  Laterality: N/A;     reports that she quit smoking about 22 years ago. Her smoking use included cigarettes. She has a 30.00 pack-year smoking history. She has never used smokeless tobacco. She reports current alcohol use. She reports that she does  not use drugs.  Allergies  Allergen Reactions  . Librium [Chlordiazepoxide]     Became unresponsive  . Toradol [Ketorolac Tromethamine] Shortness Of Breath  . Butalbital-Apap-Caffeine Other (See Comments)    jittery  . Demerol Swelling  . Esgic [Butalbital-Apap-Caffeine] Other (See Comments)    jittery  . Iodine Other (See Comments)    bp bottomed out per pt several hours later; unsure if pre medicated in past with cm; done in [place]    Family History  Problem Relation Age of Onset  . Heart disease Mother   . Hypertension Mother   . Cancer Sister        sinus  . Diabetes Brother   . Heart disease Brother   . Thyroid disease Brother   . Cancer Sister        abdominal ?  . Thyroid disease Sister   . Cancer Other        GE junction adenocarcinoma  . Emphysema Father   . Stroke Father   . Hypertension Father   . Heart disease Father   . COPD Sister   . Thyroid disease Sister   . Thyroid disease Sister   . Diabetes Brother   . Thyroid disease Brother   . Thyroid disease Brother   . Hypertension Brother   . Post-traumatic stress disorder Brother   . COPD Brother   . Thyroid disease Brother   . Thyroid disease Brother   . Hypertension Brother   . Transient ischemic attack Brother     Prior to Admission medications   Medication Sig Start Date End Date Taking? Authorizing Provider  benzonatate (TESSALON) 200 MG capsule Take 1 capsule (200 mg total) by mouth 3 (three) times daily as needed. 08/30/20   Evelina Dun A, FNP  albuterol (PROVENTIL) (2.5 MG/3ML) 0.083% nebulizer solution Take 3 mLs (2.5 mg total) by nebulization every 6 (six) hours as needed for wheezing or shortness of breath. 07/25/19   Evelina Dun A, FNP  amLODipine-olmesartan (AZOR) 10-40 MG tablet Take 1 tablet by mouth daily. 09/27/20   [name], FNP  atorvastatin (LIPITOR) 20 MG tablet Take 1 tablet (20 mg total) by mouth daily. Patient not taking: Reported on 08/11/2020 07/18/19 07/17/20  [name], FNP  chlordiazePOXIDE (LIBRIUM) 25 MG capsule 50mg  PO TID x 1D, then 25-50mg  PO BID X 1D, then 25-50mg  PO QD X 1D 08/11/20   Wyvonnia Dusky, MD  DULoxetine (CYMBALTA) 60 MG capsule Take 1 capsule (60 mg total) by mouth daily. 04/27/20   Hawks, Alyse Low A, FNP  fluticasone furoate-vilanterol (BREO ELLIPTA) 200-25 MCG/INH AEPB Inhale 1 puff into the lungs daily. 01/16/20   [name], FNP  Fluticasone-Salmeterol (ADVAIR) 100-50 MCG/DOSE AEPB  02/09/20   [provider]  folic acid (FOLVITE) 1 MG tablet Take 1 tablet (1 mg total) by mouth daily. 08/11/20   Wyvonnia Dusky, MD  hydrOXYzine (VISTARIL) 25 MG capsule Take 1 capsule (25  mg total) by mouth 3 (three) times daily as needed for anxiety. 08/27/20   [name], FNP  levothyroxine (SYNTHROID) 50 MCG tablet Take 1 tablet (50 mcg total) by mouth daily before breakfast. 06/22/20   Evelina Dun A, FNP  ondansetron (ZOFRAN) 4 MG tablet TAKE 1 TABLET(4 MG) BY MOUTH EVERY 8 HOURS AS NEEDED FOR NAUSEA Patient taking differently: Take 4 mg by mouth every 8 (eight) hours as needed for nausea or vomiting. 07/19/20   [name], FNP  thiamine 100 MG tablet Take 1 tablet (100 mg total) by mouth daily for 60 doses. 08/11/20 10/10/20  Wyvonnia Dusky, MD  Vitamin D, Ergocalciferol, (DRISDOL) 1.25 MG (50000 UNIT) CAPS capsule Take 1 capsule (50,000 Units total) by mouth every 7 (seven) days. 06/22/20   [name], FNP    Physical Exam: Vitals:   10/08/20 0415 10/08/20 0453 10/08/20 0515 10/08/20 0600  BP: (!) 148/105 (!) 153/87 136/81 (!) 147/83  Pulse: 100 (!) 104 (!) 106 (!) 119  Resp: (!) 23  (!) 24 (!) 22  Temp:      TempSrc:      SpO2: 98%  97% 98%  Weight:      Height:        Physical Exam Constitutional:      General: She is not in acute distress.    Comments: Tremulous  HENT:     Head: Normocephalic and atraumatic.  Eyes:     Extraocular Movements: Extraocular movements intact.      Conjunctiva/sclera: Conjunctivae normal.  Cardiovascular:     Rate and Rhythm: Regular rhythm. Tachycardia present.     Pulses: Normal pulses.  Pulmonary:     Effort: Pulmonary effort is normal. No respiratory distress.     Breath sounds: Normal breath sounds. No wheezing or rales.  Abdominal:     General: Bowel sounds are normal. There is no distension.     Palpations: Abdomen is soft.     Tenderness: There is no abdominal tenderness.  Musculoskeletal:        General: No swelling or tenderness.     Cervical back: Normal range of motion and neck supple.     Comments: Spine nontender to palpation  Skin:    General: Skin is warm and dry.  Neurological:     General: No focal deficit present.     Mental Status: She is alert and oriented to person, place, and time.     Sensory: No sensory deficit.     Motor: No weakness.     Comments: Strength 5 out of 5 in bilateral lower extremities.  No sensory deficit.     Labs on Admission: I have personally reviewed following labs and imaging studies  CBC: Recent Labs  Lab 10/05/20 1932 10/08/20 0059  WBC 5.1 4.7  NEUTROABS 2.2  --   HGB 13.0 11.6*  HCT 41.1 35.7*  MCV 86.0 84.6  PLT 471* 009   Basic Metabolic Panel: Recent Labs  Lab 10/05/20 1932 10/08/20 0059  NA 144 140  K 5.0 3.5  CL 105 102  CO2 22 26  GLUCOSE 90 107*  BUN 29* 17  CREATININE 0.96 1.08*  CALCIUM 10.2 9.1  MG  --  1.3*   GFR: Estimated Creatinine Clearance: 35.5 mL/min (A) (by C-G formula based on SCr of 1.08 mg/dL (H)). Liver Function Tests: Recent Labs  Lab 10/05/20 1932 10/08/20 0059  AST 52* 50*  ALT 31 31  ALKPHOS 82 74  BILITOT  0.6 0.4  PROT 7.4 6.2*  ALBUMIN 4.1 3.7   No results for input(s): LIPASE, AMYLASE in the last 168 hours. Recent Labs  Lab 10/08/20 0154  AMMONIA 15   Coagulation Profile: No results for input(s): INR, PROTIME in the last 168 hours. Cardiac Enzymes: No results for input(s): CKTOTAL, CKMB, CKMBINDEX,  TROPONINI in the last 168 hours. BNP (last 3 results) No results for input(s): PROBNP in the last 8760 hours. HbA1C: No results for input(s): HGBA1C in the last 72 hours. CBG: No results for input(s): GLUCAP in the last 168 hours. Lipid Profile: No results for input(s): CHOL, HDL, LDLCALC, TRIG, CHOLHDL, LDLDIRECT in the last 72 hours. Thyroid Function Tests: No results for input(s): TSH, T4TOTAL, FREET4, T3FREE, THYROIDAB in the last 72 hours. Anemia Panel: No results for input(s): VITAMINB12, FOLATE, FERRITIN, TIBC, IRON, RETICCTPCT in the last 72 hours. Urine analysis:    Component Value Date/Time   COLORURINE YELLOW 02/20/2020 0241   APPEARANCEUR CLEAR 02/20/2020 0241   APPEARANCEUR Hazy (A) 05/23/2018 1543   LABSPEC 1.010 02/20/2020 0241   PHURINE 7.0 02/20/2020 0241   GLUCOSEU 50 (A) 02/20/2020 0241   HGBUR NEGATIVE 02/20/2020 0241   BILIRUBINUR NEGATIVE 02/20/2020 0241   BILIRUBINUR Negative 05/23/2018 1543   KETONESUR NEGATIVE 02/20/2020 0241   PROTEINUR NEGATIVE 02/20/2020 0241   UROBILINOGEN negative 07/01/2015 1541   UROBILINOGEN 0.2 10/07/2012 1928   NITRITE NEGATIVE 02/20/2020 0241   LEUKOCYTESUR LARGE (A) 02/20/2020 0241    Radiological Exams on Admission: No results found.  Assessment/Plan Principal Problem:   Alcohol withdrawal (HCC) Active Problems:   COPD (chronic obstructive pulmonary disease) (HCC)   Hypertension   Chronic back pain   HLD (hyperlipidemia)   Alcohol dependence with withdrawal Patient with history of alcohol dependence presented to the ED requesting detox.  She was initially not displaying any signs of withdrawal but later became hypertensive, tachycardic, and tremulous.  Most recent CIWA score 13.  Blood ethanol level 182 today but was much higher at 394 during ED visit 3 days ago for evaluation of back pain.  She is at high risk for undergoing severe withdrawal during this hospitalization. -Admit to progressive care unit.  CIWA  protocol; Ativan as needed.  Thiamine and folate.  Not ordering multivitamin as she is allergic to iodine.  Monitor mag and Phos levels.  TOC consulted for help with transitioning to treatment program on discharge.  Mild normocytic anemia Likely secondary to alcoholism. -Anemia panel  Hypomagnesemia Likely secondary to alcoholism. -Replete magnesium and continue to monitor electrolytes.  Mild transaminitis Likely secondary to chronic alcohol use.  AST 50, remainder of LFTs normal. -Right upper quadrant ultrasound ordered to assess for signs of cirrhosis.  Continue to monitor LFTs.  Chronic back pain She has no motor or sensory deficit on exam of her lower extremities.  No complaints of saddle anesthesia or bowel/bladder dysfunction.  Unable to localize site of pain as she reports pain all over her back.  Spine nontender to palpation.  She was seen in the ED 3 days ago for the same problem and lumbar x-ray done at that time was negative.  Patient is requesting tramadol for her back pain and claiming that her pain pills keep disappearing at home.  Daughter-in-law at bedside is concerned about patient's use of pain medications and reportedly she overdosed recently and had to be given Narcan. -Appears comfortable with no signs of distress.  Will order lidocaine patch, heating pad.  Avoid opiates, muscle relaxers/sedating meds.  Hypertension Systolic currently in the 140s. -Resume home meds after pharmacy med rec is done.  COPD Stable.  No signs of acute exacerbation. -Resume home inhalers after pharmacy med rec is done  Hyperlipidemia Hypothyroidism Depression, anxiety -Pharmacy med rec pending.  DVT prophylaxis: Lovenox Code Status: Full code-discussed with the patient. Family Communication: No family available at this time. Disposition Plan: Status is: Inpatient  Remains inpatient appropriate because:Inpatient level of care appropriate due to severity of illness   Dispo: The  patient is from: Home              Anticipated d/c is to: Treatment program for alcohol use              Patient currently is not medically stable to d/c.   Difficult to place patient No  Level of care: Level of care: Progressive   The medical decision making on this patient was of high complexity and the patient is at high risk for clinical deterioration, therefore this is a level 3 visit.  Shela Leff MD [facility]  If 7PM-7AM, please contact night-coverage [email or web address]  10/08/2020, 6:38 AM

## 2020-10-08 NOTE — Telephone Encounter (Signed)
Has been out several times to try to see Stacy Dalton and has been able to get anyone to come to the door.  She has not been able to see her in over a week and would like verbal orders to discharge client.  Please advise.

## 2020-10-08 NOTE — ED Notes (Signed)
Patient reports chronic back pain "for years." Reports that she recently (within the last couple of months) began drinking for her pain. She drinks vodka and over the last two days has drank a handle of vodka. Patient reports that she is prescribed tramadol for her pain, however "the pills keep disappearing, I don't know who is taking them." Patient alert and oriented x 3. Reports very tremulous walking while going through withdrawal. Patient also reports that a couple of weeks ago, she had double PNA and was drinking heavily. States that she stopped breathing and needed to be resuscitated by EMS. Patient states that she wants to quit drinking and to get her back pain to go away.

## 2020-10-08 NOTE — ED Provider Notes (Signed)
[facility] EMERGENCY DEPARTMENT Provider Note   CSN: [id number] Arrival date & time: 10/08/20  0045     History Chief Complaint  Patient presents with  . Alcohol Intoxication    Stacy Dalton is a 70 y.o. female.  Patient to ED for treatment of alcohol abuse. Patient reports she started drinking less than one year ago. She drinks daily and feels symptoms concerning for withdrawal if she misses one day of alcohol intake. Her symptoms are described as severe weakness in her legs threatening falls (has fallen twice), shaky, nauseous, chest tightness and palpitations. She is not aware that she has ever had a seizure from withdrawal. She is here with her daughter-in-law who contributes to history and reports she is aware the patient has been abusing opioids as well. She states today the patient has been confused. No fever, SOB, vomiting, syncope or seizure. The patient reports pain in her lower back that is longstanding. Last alcohol use was last evening, 4/7. She states she consumed her 'normal' amount of alcohol yesterday but is not starting to feel shaky. No SI/HI/AVH.  The history is provided by the patient and a relative. No language interpreter was used.  Alcohol Intoxication       Past Medical History:  Diagnosis Date  . Chronic pain   . COPD (chronic obstructive pulmonary disease) ([place])    told has copd, no current inhaler use  . Cough   . Depression   . GERD (gastroesophageal reflux disease)   . Hypertension   . Hypothyroidism   . Insomnia   . Migraine   . Migraine   . Osteopenia   . Pain management   . Panic attacks   . Small bowel obstruction [facility])     Patient Active Problem List   Diagnosis Date Noted  . Alcohol abuse 06/21/2020  . Multifocal pneumonia 09/19/2018  . Large bowel obstruction ([place])   . Small bowel obstruction ([place]) 07/20/2018  . Hypomagnesemia 07/20/2018  . Cough 03/14/2018  . Fatigue 03/14/2018  . Polypharmacy 02/23/2017   . Hypokalemia 02/23/2017  . Toxic metabolic encephalopathy 68/06/7516  . Anxiety 02/22/2017  . GERD (gastroesophageal reflux disease) 02/22/2017  . Pain medication agreement signed 10/10/2016  . Opioid dependence ([place]) 10/10/2016  . Status post colostomy takedown 04/12/2016  . Chronic constipation 12/03/2015  . Peritonitis with abscess of intestine ([place]) 11/29/2015  . COPD exacerbation ([place]) 09/20/2015  . Osteopenia 06/10/2014  . Vitamin D deficiency 06/10/2014  . Hypothyroidism   . Depression 11/18/2012  . Insomnia 11/18/2012  . Chronic pain syndrome 11/18/2012  . HLD (hyperlipidemia) 09/01/2012  . S/P colostomy ([place]) 04/19/2012  . Normocytic anemia 10/15/2011  . Hypertension 10/13/2011  . Chronic back pain 10/13/2011  . COPD (chronic obstructive pulmonary disease) ([place]) 10/09/2011    Past Surgical History:  Procedure Laterality Date  . ABDOMINAL HYSTERECTOMY    . COLON SURGERY    . COLOSTOMY CLOSURE  04/19/2012   Procedure: COLOSTOMY CLOSURE;  Surgeon: Adin Hector, MD;  Location: [facility] ORS;  Service: General;  Laterality: N/A;  Laparotomy, Resection and Closure of Colostomy  . COLOSTOMY TAKEDOWN N/A 04/12/2016   Procedure: [place] TAKEDOWN;  Surgeon: Johnathan Hausen, MD;  Location: [facility];  Service: General;  Laterality: N/A;  . INCONTINENCE SURGERY    . LAPAROTOMY  10/04/2011, colostomy also   Procedure: EXPLORATORY LAPAROTOMY;  Surgeon: Adin Hector, MD;  Location: [facility];  Service: General;  Laterality: N/A;  left partial colectomy with colostomy  .  LAPAROTOMY  04/19/2012   Procedure: EXPLORATORY LAPAROTOMY;  Surgeon: Adin Hector, MD;  Location: [facility];  Service: General;  Laterality: N/A;  . LAPAROTOMY N/A 11/29/2015   Procedure: EXPLORATORY LAPAROTOMY; SUBTOTAL COLECTOMY WITH HARTMAN PROCEDURE AND END COLOSTOMY;  Surgeon: Johnathan Hausen, MD;  Location: [facility];  Service: General;  Laterality: N/A;  . TUBAL LIGATION    . VENTRAL HERNIA REPAIR  04/19/2012    Procedure: HERNIA REPAIR VENTRAL ADULT;  Surgeon: Adin Hector, MD;  Location: [facility];  Service: General;  Laterality: N/A;     OB History    Gravida      Para      Term      Preterm      AB      Living  5     SAB      IAB      Ectopic      Multiple      Live Births              Family History  Problem Relation Age of Onset  . Heart disease Mother   . Hypertension Mother   . Cancer Sister        sinus  . Diabetes Brother   . Heart disease Brother   . Thyroid disease Brother   . Cancer Sister        abdominal ?  . Thyroid disease Sister   . Cancer Other        GE junction adenocarcinoma  . Emphysema Father   . Stroke Father   . Hypertension Father   . Heart disease Father   . COPD Sister   . Thyroid disease Sister   . Thyroid disease Sister   . Diabetes Brother   . Thyroid disease Brother   . Thyroid disease Brother   . Hypertension Brother   . Post-traumatic stress disorder Brother   . COPD Brother   . Thyroid disease Brother   . Thyroid disease Brother   . Hypertension Brother   . Transient ischemic attack Brother     Social History   Tobacco Use  . Smoking status: Former Smoker    Packs/day: 1.50    Years: 20.00    Pack years: 30.00    Types: Cigarettes    Quit date: 10/04/1998    Years since quitting: 22.0  . Smokeless tobacco: Never Used  Vaping Use  . Vaping Use: Never used  Substance Use Topics  . Alcohol use: Yes  . Drug use: No    Home Medications Prior to Admission medications   Medication Sig Start Date End Date Taking? Authorizing Provider  benzonatate (TESSALON) 200 MG capsule Take 1 capsule (200 mg total) by mouth 3 (three) times daily as needed. 08/30/20   Evelina Dun A, FNP  albuterol (PROVENTIL) (2.5 MG/3ML) 0.083% nebulizer solution Take 3 mLs (2.5 mg total) by nebulization every 6 (six) hours as needed for wheezing or shortness of breath. 07/25/19   Evelina Dun A, FNP  amLODipine-olmesartan (AZOR) 10-40 MG  tablet Take 1 tablet by mouth daily. 09/27/20   [name], FNP  atorvastatin (LIPITOR) 20 MG tablet Take 1 tablet (20 mg total) by mouth daily. Patient not taking: Reported on 08/11/2020 07/18/19 07/17/20  [name], FNP  chlordiazePOXIDE (LIBRIUM) 25 MG capsule 50mg  PO TID x 1D, then 25-50mg  PO BID X 1D, then 25-50mg  PO QD X 1D 08/11/20   Wyvonnia Dusky, MD  DULoxetine (CYMBALTA) 60 MG capsule  Take 1 capsule (60 mg total) by mouth daily. 04/27/20   Hawks, Alyse Low A, FNP  fluticasone furoate-vilanterol (BREO ELLIPTA) 200-25 MCG/INH AEPB Inhale 1 puff into the lungs daily. 01/16/20   [name], FNP  Fluticasone-Salmeterol (ADVAIR) 100-50 MCG/DOSE AEPB  02/09/20   [provider]  folic acid (FOLVITE) 1 MG tablet Take 1 tablet (1 mg total) by mouth daily. 08/11/20   Wyvonnia Dusky, MD  hydrOXYzine (VISTARIL) 25 MG capsule Take 1 capsule (25 mg total) by mouth 3 (three) times daily as needed for anxiety. 08/27/20   [name], FNP  levothyroxine (SYNTHROID) 50 MCG tablet Take 1 tablet (50 mcg total) by mouth daily before breakfast. 06/22/20   Evelina Dun A, FNP  ondansetron (ZOFRAN) 4 MG tablet TAKE 1 TABLET(4 MG) BY MOUTH EVERY 8 HOURS AS NEEDED FOR NAUSEA Patient taking differently: Take 4 mg by mouth every 8 (eight) hours as needed for nausea or vomiting. 07/19/20   [name], FNP  thiamine 100 MG tablet Take 1 tablet (100 mg total) by mouth daily for 60 doses. 08/11/20 10/10/20  Wyvonnia Dusky, MD  Vitamin D, Ergocalciferol, (DRISDOL) 1.25 MG (50000 UNIT) CAPS capsule Take 1 capsule (50,000 Units total) by mouth every 7 (seven) days. 06/22/20   [name], FNP    Allergies    Toradol [ketorolac tromethamine], Butalbital-apap-caffeine, Demerol, Esgic [butalbital-apap-caffeine], and Iodine  Review of Systems   Review of Systems  Constitutional: Negative for chills and fever.  HENT: Negative.   Respiratory: Negative.   Cardiovascular: Positive for  palpitations.  Gastrointestinal: Positive for nausea. Negative for vomiting.  Musculoskeletal: Positive for back pain.  Skin: Negative.   Neurological: Positive for weakness.  Psychiatric/Behavioral: Positive for confusion.    Physical Exam Updated Vital Signs BP (!) 151/76   Pulse 96   Temp 99.1 F (37.3 C) (Oral)   Resp (!) 22   Ht 5\' 1"  (1.549 m)   Wt 50.3 kg   SpO2 99%   BMI 20.97 kg/m   Physical Exam Vitals and nursing note reviewed.  Constitutional:      Appearance: She is well-developed.  HENT:     Head: Normocephalic.  Cardiovascular:     Rate and Rhythm: Normal rate and regular rhythm.     Heart sounds: Murmur (grade 2/6 systolic) heard.    Pulmonary:     Effort: Pulmonary effort is normal.     Breath sounds: Normal breath sounds. No wheezing, rhonchi or rales.  Chest:     Chest wall: No tenderness.  Abdominal:     General: Bowel sounds are normal.     Palpations: Abdomen is soft.     Tenderness: There is no abdominal tenderness. There is no guarding or rebound.  Musculoskeletal:        General: Normal range of motion.     Cervical back: Normal range of motion and neck supple.  Skin:    General: Skin is warm and dry.     Findings: No rash.  Neurological:     Mental Status: She is alert and oriented to person, place, and time.     GCS: GCS eye subscore is 4. GCS verbal subscore is 5. GCS motor subscore is 6.     Cranial Nerves: No cranial nerve deficit or dysarthria.     Sensory: Sensation is intact.     Motor: No weakness or tremor.     Comments: No asterixis     ED Results / Procedures /  Treatments   Labs (all labs ordered are listed, but only abnormal results are displayed) Labs Reviewed  COMPREHENSIVE METABOLIC PANEL  ETHANOL  SALICYLATE LEVEL  ACETAMINOPHEN LEVEL  CBC  RAPID URINE DRUG SCREEN, HOSP PERFORMED  AMMONIA    EKG None  Radiology No results found.  Procedures Procedures   Medications Ordered in ED Medications   sodium chloride 0.9 % bolus 500 mL (has no administration in time range)  0.9 %  sodium chloride infusion (has no administration in time range)  LORazepam (ATIVAN) injection 0.5 mg (has no administration in time range)    ED Course  I have reviewed the triage vital signs and the nursing notes.  Pertinent labs & imaging results that were available during my care of the patient were reviewed by me and considered in my medical decision making (see chart for details).    MDM Rules/Calculators/A&P                          Patient to ED for help with alcohol dependence. Here with family.   The patient is oriented, no significant confusion. Ammonia normal. CIWA protocol ordered. Mild dose of Ativan provided for subjective restlessness and discomfort. She has no tremors, not tachy, has mild hypertension. VSS. Alcohol 182. Not in clinical withdrawal when initially evaluated. On reassessment, however, she is increasingly hypertensive, now tachy to 118 despite IV fluids (bolus and infusion) and has developed tremors of the UE's. No confusion.  Feel she would benefit from admission for withdrawal with possible transition to treatment program on discharge coordinated with SW/TOC.  Hospitalist paged for admission. Patient and family updated.     Final Clinical Impression(s) / ED Diagnoses Final diagnoses:  None   1. Alcohol withdrawal 2. Alcohol dependence  Rx / DC Orders ED Discharge Orders    None       Dennie Bible 10/08/20 1517    Fatima Blank, MD 10/08/20 [phone]

## 2020-10-08 NOTE — Progress Notes (Signed)
   10/08/20 0809  Assess: MEWS Score  Temp 97.8 F (36.6 C)  BP (!) 159/85  Pulse Rate (!) 102  ECG Heart Rate (!) 101  SpO2 99 %  O2 Device Room Air  Assess: MEWS Score  MEWS Temp 0  MEWS Systolic 0  MEWS Pulse 1  MEWS RR 1  MEWS LOC 0  MEWS Score 2  MEWS Score Color Yellow  Assess: if the MEWS score is Yellow or Red  Were vital signs taken at a resting state? Yes  Focused Assessment No change from prior assessment  Early Detection of Sepsis Score *See Row Information* Medium  MEWS guidelines implemented *See Row Information* Yes  Treat  MEWS Interventions Administered prn meds/treatments  Take Vital Signs  Increase Vital Sign Frequency  Yellow: Q 2hr X 2 then Q 4hr X 2, if remains yellow, continue Q 4hrs  Escalate  MEWS: Escalate Yellow: discuss with charge nurse/RN and consider discussing with provider and RRT  Notify: Charge Nurse/RN  Name of Charge Nurse/RN Notified Chrissy, RN  Date Charge Nurse/RN Notified 10/08/20  Time Charge Nurse/RN Notified 0810  Notify: Provider  Provider Name/Title Beckie Salts, MD  Date Provider Notified 10/08/20  Time Provider Notified [phone]  Notification Type  (secure chat)  Provider response  (awaiting orders)  Document  Patient Outcome Other (Comment) (No change in status, pt stable)  Progress note created (see row info) Yes

## 2020-10-08 NOTE — Social Work (Signed)
CSW received consult for substance use resources for patient.CSW spoke with patient at bedside. CSW offered patient outpatient substance use treatment services resources. Patient accepted.CSW will continue to follow.

## 2020-10-09 DIAGNOSIS — I10 Essential (primary) hypertension: Secondary | ICD-10-CM

## 2020-10-09 DIAGNOSIS — E785 Hyperlipidemia, unspecified: Secondary | ICD-10-CM

## 2020-10-09 DIAGNOSIS — M546 Pain in thoracic spine: Secondary | ICD-10-CM

## 2020-10-09 DIAGNOSIS — G8929 Other chronic pain: Secondary | ICD-10-CM

## 2020-10-09 LAB — MAGNESIUM: Magnesium: 1.6 mg/dL — ABNORMAL LOW (ref 1.7–2.4)

## 2020-10-09 MED ORDER — MAGNESIUM SULFATE 2 GM/50ML IV SOLN
2.0000 g | Freq: Once | INTRAVENOUS | Status: AC
  Filled 2020-10-09: qty 50

## 2020-10-09 MED ORDER — MAGNESIUM OXIDE 400 (241.3 MG) MG PO TABS
400.0000 mg | ORAL_TABLET | Freq: Every day | ORAL | Status: DC
  Administered 2020-10-09 – 2020-10-11 (×3): 400 mg via ORAL
  Filled 2020-10-09 (×3): qty 1

## 2020-10-09 MED ORDER — ALUM & MAG HYDROXIDE-SIMETH 200-200-20 MG/5ML PO SUSP
30.0000 mL | ORAL | Status: DC | PRN
  Administered 2020-10-09: 30 mL via ORAL
  Filled 2020-10-09: qty 30

## 2020-10-09 NOTE — Progress Notes (Signed)
PROGRESS NOTE    Stacy Dalton  ZJQ:[id number] DOB: 02/23/1951 DOA: 10/08/2020 PCP: [name], FNP   Brief Narrative: Taken from H&P. Stacy Dalton is a 70 y.o. female with medical history significant of alcohol dependence, COPD, chronic pain, hypertension, hyperlipidemia, hypothyroidism, migraine, panic attacks, anxiety, depression requesting alcohol detox.  Labs done in the ED showing WBC 4.7, hemoglobin 11.6, MCV 84.6, platelet count 336K.  Sodium 140, potassium 3.5, chloride 102, bicarb 26, BUN 17, creatinine 1.0, glucose 107.  AST mildly elevated at 50.  Remainder of LFTs normal.  Blood ethanol level 182.  UDS negative.  Acetaminophen and salicylate levels undetectable.  Magnesium 1.3.  Ammonia level normal.  Screening Covid and influenza PCR pending. Initially in the ED, patient was not displaying any signs of withdrawal but later became hypertensive, tachycardic, and tremulous.  CIWA protocol initiated. Patient wants to get detox and promised not to use alcohol again as it almost killed me.  Subjective: Patient was seen and examined today.  She was feeling little anxious.  We discussed about starting her on Librium and she can go home on a tapering dose, per patient she had some sort of reaction in the past but could not remember what type?  Tolerating Ativan well.  Assessment & Plan:   Principal Problem:   Alcohol withdrawal ([place]) Active Problems:   COPD (chronic obstructive pulmonary disease) ([facility])   Hypertension   Chronic back pain   HLD (hyperlipidemia)  Alcohol dependence with withdrawal Patient with history of alcohol dependence presented to the ED requesting detox. CIWA score of 8. -We will ask pharmacy to see what type of reaction she had it with Librium as that might be a better option for her. -Continue with CIWA protocol. -She will need some information regarding alcohol cessation programs.  Mild normocytic anemia Likely secondary to alcoholism. -Anemia  panel within normal limit.  Hypomagnesemia Likely secondary to alcoholism. -Replete magnesium and continue to monitor electrolytes.  Mild transaminitis Likely secondary to chronic alcohol use.  AST 50, remainder of LFTs normal. -Right upper quadrant ultrasound was within normal limits.  No significant abnormality.  Chronic back pain She has no motor or sensory deficit on exam of her lower extremities.  No complaints of saddle anesthesia or bowel/bladder dysfunction.  Unable to localize site of pain as she reports pain all over her back.  Spine nontender to palpation.  She was seen in the ED 3 days ago for the same problem and lumbar x-ray done at that time was negative.  Patient is requesting tramadol for her back pain and claiming that her pain pills keep disappearing at home.  Daughter-in-law at bedside is concerned about patient's use of pain medications and reportedly she overdosed recently and had to be given Narcan. -Lidocaine patch was ordered. -We will try avoiding opiates  Objective: Vitals:   10/09/20 1000 10/09/20 1231 10/09/20 1400 10/09/20 1541  BP: (!) 148/97 134/83 (!) 147/90 (!) 154/127  Pulse: (!) 104 97 95 (!) 137  Resp:  19  19  Temp:  97.6 F (36.4 C)  97.6 F (36.4 C)  TempSrc:  Oral    SpO2:  98%  98%  Weight:      Height:        Intake/Output Summary (Last 24 hours) at 10/09/2020 1656 Last data filed at 10/09/2020 1215 Gross per 24 hour  Intake 360 ml  Output 1200 ml  Net -840 ml   Filed Weights   10/08/20 0136 10/09/20 0426  Weight: 50.3 kg 49.8 kg    Examination:  General exam: Appears little anxious but comfortable  Respiratory system: Clear to auscultation. Respiratory effort normal. Cardiovascular system: S1 & S2 heard, RRR. No JVD, murmurs, rubs, gallops or clicks. Gastrointestinal system: Soft, nontender, nondistended, bowel sounds positive. Central nervous system: Alert and oriented. No focal neurological deficits. Extremities: No edema,  no cyanosis, pulses intact and symmetrical. Psychiatry: Judgement and insight appear normal. Mood & affect appropriate.    DVT prophylaxis: Lovenox Code Status: Full Family Communication: Discussed with patient Disposition Plan:  Status is: Inpatient  Remains inpatient appropriate because:Inpatient level of care appropriate due to severity of illness   Dispo: The patient is from: Home              Anticipated d/c is to: Home              Patient currently is not medically stable to d/c.   Difficult to place patient No                Level of care: Progressive  All the records are reviewed and case discussed with Care Management/Social Worker. Management plans discussed with the patient, nursing and they are in agreement.  Consultants:   None  Procedures:  Antimicrobials:   Data Reviewed: I have personally reviewed following labs and imaging studies  CBC: Recent Labs  Lab 10/05/20 1932 10/08/20 0059  WBC 5.1 4.7  NEUTROABS 2.2  --   HGB 13.0 11.6*  HCT 41.1 35.7*  MCV 86.0 84.6  PLT 471* 681   Basic Metabolic Panel: Recent Labs  Lab 10/05/20 1932 10/08/20 0059 10/08/20 0837 10/09/20 0300  NA 144 140 136  --   K 5.0 3.5 3.7  --   CL 105 102 104  --   CO2 22 26 23   --   GLUCOSE 90 107* 102*  --   BUN 29* 17 16  --   CREATININE 0.96 1.08* 0.83  --   CALCIUM 10.2 9.1 8.5*  --   MG  --  1.3* 1.9 1.6*  PHOS  --   --  3.4  --    GFR: Estimated Creatinine Clearance: 46.2 mL/min (by C-G formula based on SCr of 0.83 mg/dL). Liver Function Tests: Recent Labs  Lab 10/05/20 1932 10/08/20 0059 10/08/20 0837  AST 52* 50* 46*  ALT 31 31 28   ALKPHOS 82 74 68  BILITOT 0.6 0.4 0.8  PROT 7.4 6.2* 5.7*  ALBUMIN 4.1 3.7 3.3*   No results for input(s): LIPASE, AMYLASE in the last 168 hours. Recent Labs  Lab 10/08/20 0154  AMMONIA 15   Coagulation Profile: No results for input(s): INR, PROTIME in the last 168 hours. Cardiac Enzymes: No results for input(s):  CKTOTAL, CKMB, CKMBINDEX, TROPONINI in the last 168 hours. BNP (last 3 results) No results for input(s): PROBNP in the last 8760 hours. HbA1C: No results for input(s): HGBA1C in the last 72 hours. CBG: No results for input(s): GLUCAP in the last 168 hours. Lipid Profile: No results for input(s): CHOL, HDL, LDLCALC, TRIG, CHOLHDL, LDLDIRECT in the last 72 hours. Thyroid Function Tests: Recent Labs    10/08/20 1443  TSH 4.191   Anemia Panel: Recent Labs    10/08/20 0837  VITAMINB12 398  FOLATE 11.4  FERRITIN 67  TIBC 371  IRON 81  RETICCTPCT 0.6   Sepsis Labs: No results for input(s): PROCALCITON, LATICACIDVEN in the last 168 hours.  Recent Results (from the past 240 hour(s))  Resp Panel by RT-PCR (Flu A&B, Covid) Nasopharyngeal Swab     Status: None   Collection Time: 10/08/20  4:31 AM   Specimen: Nasopharyngeal Swab; Nasopharyngeal(NP) swabs in vial transport medium  Result Value Ref Range Status   SARS Coronavirus 2 by RT PCR NEGATIVE NEGATIVE Final    Comment: (NOTE) SARS-CoV-2 target nucleic acids are NOT DETECTED.  The SARS-CoV-2 RNA is generally detectable in upper respiratory specimens during the acute phase of infection. The lowest concentration of SARS-CoV-2 viral copies this assay can detect is 138 copies/mL. A negative result does not preclude SARS-Cov-2 infection and should not be used as the sole basis for treatment or other patient management decisions. A negative result may occur with  improper specimen collection/handling, submission of specimen other than nasopharyngeal swab, presence of viral mutation(s) within the areas targeted by this assay, and inadequate number of viral copies(<138 copies/mL). A negative result must be combined with clinical observations, patient history, and epidemiological information. The expected result is Negative.  Fact Sheet for Patients:  EntrepreneurPulse.com.au  Fact Sheet for Healthcare Providers:   IncredibleEmployment.be  This test is no t yet approved or cleared by the [place] FDA and  has been authorized for detection and/or diagnosis of SARS-CoV-2 by FDA under an Emergency Use Authorization (EUA). This EUA will remain  in effect (meaning this test can be used) for the duration of the COVID-19 declaration under Section 564(b)(1) of the Act, 21 U.S.C.section 360bbb-3(b)(1), unless the authorization is terminated  or revoked sooner.       Influenza A by PCR NEGATIVE NEGATIVE Final   Influenza B by PCR NEGATIVE NEGATIVE Final    Comment: (NOTE) The Xpert Xpress SARS-CoV-2/FLU/RSV plus assay is intended as an aid in the diagnosis of influenza from Nasopharyngeal swab specimens and should not be used as a sole basis for treatment. Nasal washings and aspirates are unacceptable for Xpert Xpress SARS-CoV-2/FLU/RSV testing.  Fact Sheet for Patients: EntrepreneurPulse.com.au  Fact Sheet for Healthcare Providers: IncredibleEmployment.be  This test is not yet approved or cleared by the [place] FDA and has been authorized for detection and/or diagnosis of SARS-CoV-2 by FDA under an Emergency Use Authorization (EUA). This EUA will remain in effect (meaning this test can be used) for the duration of the COVID-19 declaration under Section 564(b)(1) of the Act, 21 U.S.C. section 360bbb-3(b)(1), unless the authorization is terminated or revoked.  Performed at [facility], [place] [address] [place], [place] [address]      Radiology Studies: US Abdomen Limited RUQ (LIVER/GB)  Result Date: 10/08/2020 CLINICAL DATA:  Elevated liver function tests EXAM: ULTRASOUND ABDOMEN LIMITED RIGHT UPPER QUADRANT COMPARISON:  Abdomen and pelvis CT 07/20/2018 FINDINGS: Gallbladder: Incomplete distension. No wall thickening or focal tenderness. Negative for calculus. Common bile duct: Diameter: 6 mm.  Where visualized, no filling  defect. Liver: No focal lesion identified. Within normal limits in parenchymal echogenicity. Portal vein is patent on color Doppler imaging with normal direction of blood flow towards the liver. IMPRESSION: Negative right upper quadrant ultrasound. Electronically Signed   By: Monte Fantasia M.D.   On: 10/08/2020 06:41    Scheduled Meds: . enoxaparin (LOVENOX) injection  40 mg Subcutaneous Daily  . folic acid  1 mg Oral Daily  . lidocaine  1 patch Transdermal Q24H  . magnesium oxide  400 mg Oral Daily  . thiamine  100 mg Oral Daily   Or  . thiamine  100 mg Intravenous Daily  . vitamin B-12  100  mcg Oral Daily   Continuous Infusions:   LOS: 1 day   Time spent: 35 minutes. More than 50% of the time was spent in counseling/coordination of care  Lorella Nimrod, MD [facility]  If 7PM-7AM, please contact night-coverage [email or web address]  10/09/2020, 4:56 PM   This record has been created using Systems analyst. Errors have been sought and corrected,but may not always be located. Such creation errors do not reflect on the standard of care.

## 2020-10-10 LAB — CBC
HCT: 33.8 % — ABNORMAL LOW (ref 36.0–46.0)
Hemoglobin: 10.8 g/dL — ABNORMAL LOW (ref 12.0–15.0)
MCH: 26.7 pg (ref 26.0–34.0)
MCHC: 32 g/dL (ref 30.0–36.0)
MCV: 83.5 fL (ref 80.0–100.0)
Platelets: 204 10*3/uL (ref 150–400)
RBC: 4.05 MIL/uL (ref 3.87–5.11)
RDW: 15.9 % — ABNORMAL HIGH (ref 11.5–15.5)
WBC: 6.3 10*3/uL (ref 4.0–10.5)
nRBC: 0 % (ref 0.0–0.2)

## 2020-10-10 LAB — PHOSPHORUS: Phosphorus: 4.1 mg/dL (ref 2.5–4.6)

## 2020-10-10 LAB — BASIC METABOLIC PANEL
Anion gap: 8 (ref 5–15)
BUN: 24 mg/dL — ABNORMAL HIGH (ref 8–23)
CO2: 26 mmol/L (ref 22–32)
Calcium: 8.9 mg/dL (ref 8.9–10.3)
Chloride: 100 mmol/L (ref 98–111)
Creatinine, Ser: 0.8 mg/dL (ref 0.44–1.00)
GFR, Estimated: >60 mL/min (ref >60)
Glucose, Bld: 105 mg/dL — ABNORMAL HIGH (ref 70–99)
Potassium: 3.9 mmol/L (ref 3.5–5.1)
Sodium: 134 mmol/L — ABNORMAL LOW (ref 135–145)

## 2020-10-10 LAB — MAGNESIUM: Magnesium: 1.6 mg/dL — ABNORMAL LOW (ref 1.7–2.4)

## 2020-10-10 MED ORDER — ALUM & MAG HYDROXIDE-SIMETH 200-200-20 MG/5ML PO SUSP
30.0000 mL | Freq: Once | ORAL | Status: AC
  Administered 2020-10-10: 30 mL via ORAL
  Filled 2020-10-10: qty 30

## 2020-10-10 MED ORDER — MAGNESIUM SULFATE 4 GM/100ML IV SOLN
4.0000 g | Freq: Once | INTRAVENOUS | Status: AC
  Administered 2020-10-10: 4 g via INTRAVENOUS
  Filled 2020-10-10: qty 100

## 2020-10-10 MED ORDER — PANTOPRAZOLE SODIUM 40 MG PO TBEC
40.0000 mg | DELAYED_RELEASE_TABLET | Freq: Every day | ORAL | Status: DC
  Administered 2020-10-10 – 2020-10-11 (×2): 40 mg via ORAL
  Filled 2020-10-10 (×2): qty 1

## 2020-10-10 MED ORDER — LIDOCAINE VISCOUS HCL 2 % MT SOLN
15.0000 mL | Freq: Once | OROMUCOSAL | Status: DC
  Filled 2020-10-10: qty 15

## 2020-10-10 MED ORDER — HYDROCORTISONE 1 % EX OINT
TOPICAL_OINTMENT | Freq: Four times a day (QID) | CUTANEOUS | Status: DC | PRN
  Filled 2020-10-10: qty 28

## 2020-10-10 NOTE — Progress Notes (Signed)
PROGRESS NOTE    Stacy Dalton  QPY:[id number] DOB: 09-13-1950 DOA: 10/08/2020 PCP: [name], FNP   Brief Narrative: Taken from H&P. Stacy Dalton is a 70 y.o. female with medical history significant of alcohol dependence, COPD, chronic pain, hypertension, hyperlipidemia, hypothyroidism, migraine, panic attacks, anxiety, depression requesting alcohol detox.  Labs done in the ED showing WBC 4.7, hemoglobin 11.6, MCV 84.6, platelet count 336K.  Sodium 140, potassium 3.5, chloride 102, bicarb 26, BUN 17, creatinine 1.0, glucose 107.  AST mildly elevated at 50.  Remainder of LFTs normal.  Blood ethanol level 182.  UDS negative.  Acetaminophen and salicylate levels undetectable.  Magnesium 1.3.  Ammonia level normal.  Screening Covid and influenza PCR pending. Initially in the ED, patient was not displaying any signs of withdrawal but later became hypertensive, tachycardic, and tremulous.  CIWA protocol initiated. Patient wants to get detox and promised not to use alcohol again as it almost killed me.  Subjective: Patient was complaining of epigastric and right upper quadrant pain.  No nausea or vomiting.  No relationship with meals.  Assessment & Plan:   Principal Problem:   Alcohol dependence with withdrawal with complication ([place]) Active Problems:   COPD (chronic obstructive pulmonary disease) (HCC)   Hypertension   Chronic back pain   HLD (hyperlipidemia)  Alcohol dependence with withdrawal Patient with history of alcohol dependence presented to the ED requesting detox. CIWA score of 9.she apparently became unconscious after taking librium, not sure about the dose. -Continue with CIWA protocol. -She will need some information regarding alcohol cessation programs.  Epigastric pain.  Patient had right UQ ultrasound on admission and it was without any significant abnormality. -We will try GI cocktail. -Add Protonix  Mild normocytic anemia Likely secondary to  alcoholism. -Anemia panel within normal limit.  Hypomagnesemia. Remained at 1.6 despite replacement. Likely secondary to alcoholism. -Replete magnesium and continue to monitor electrolytes.  Mild transaminitis Likely secondary to chronic alcohol use.  AST 50, remainder of LFTs normal. -Right upper quadrant ultrasound was within normal limits.  No significant abnormality.  Chronic back pain She has no motor or sensory deficit on exam of her lower extremities.  No complaints of saddle anesthesia or bowel/bladder dysfunction.  Unable to localize site of pain as she reports pain all over her back.  Spine nontender to palpation.  She was seen in the ED 3 days ago for the same problem and lumbar x-ray done at that time was negative.  Patient is requesting tramadol for her back pain and claiming that her pain pills keep disappearing at home.  Daughter-in-law at bedside is concerned about patient's use of pain medications and reportedly she overdosed recently and had to be given Narcan. -Lidocaine patch was ordered. -We will try avoiding opiates  Objective: Vitals:   10/10/20 0750 10/10/20 1055 10/10/20 1251 10/10/20 1252  BP: (!) 148/91 132/88  (!) 133/92  Pulse: 89 83 93 95  Resp:    18  Temp:      TempSrc:      SpO2: 98% 96%  98%  Weight:      Height:        Intake/Output Summary (Last 24 hours) at 10/10/2020 1500 Last data filed at 10/10/2020 0426 Gross per 24 hour  Intake 840 ml  Output 650 ml  Net 190 ml   Filed Weights   10/08/20 0136 10/09/20 0426 10/10/20 0425  Weight: 50.3 kg 49.8 kg 50.4 kg    Examination:  General.  Well-developed  elderly lady, in no acute distress. Pulmonary.  Lungs clear bilaterally, normal respiratory effort. CV.  Regular rate and rhythm, no JVD, rub or murmur. Abdomen.  Soft, nontender, nondistended, BS positive. CNS.  Alert and oriented x3.  No focal neurologic deficit. Extremities.  No edema, no cyanosis, pulses intact and  symmetrical. Psychiatry.  Judgment and insight appears normal.   DVT prophylaxis: Lovenox Code Status: Full Family Communication: Discussed with patient Disposition Plan:  Status is: Inpatient  Remains inpatient appropriate because:Inpatient level of care appropriate due to severity of illness   Dispo: The patient is from: Home              Anticipated d/c is to: Home              Patient currently is not medically stable to d/c.   Difficult to place patient No                Level of care: Progressive  All the records are reviewed and case discussed with Care Management/Social Worker. Management plans discussed with the patient, nursing and they are in agreement.  Consultants:   None  Procedures:  Antimicrobials:   Data Reviewed: I have personally reviewed following labs and imaging studies  CBC: Recent Labs  Lab 10/05/20 1932 10/08/20 0059 10/10/20 0111  WBC 5.1 4.7 6.3  NEUTROABS 2.2  --   --   HGB 13.0 11.6* 10.8*  HCT 41.1 35.7* 33.8*  MCV 86.0 84.6 83.5  PLT 471* 336 540   Basic Metabolic Panel: Recent Labs  Lab 10/05/20 1932 10/08/20 0059 10/08/20 0837 10/09/20 0300 10/10/20 0111  NA 144 140 136  --  134*  K 5.0 3.5 3.7  --  3.9  CL 105 102 104  --  100  CO2 22 26 23   --  26  GLUCOSE 90 107* 102*  --  105*  BUN 29* 17 16  --  24*  CREATININE 0.96 1.08* 0.83  --  0.80  CALCIUM 10.2 9.1 8.5*  --  8.9  MG  --  1.3* 1.9 1.6* 1.6*  PHOS  --   --  3.4  --  4.1   GFR: Estimated Creatinine Clearance: 48 mL/min (by C-G formula based on SCr of 0.8 mg/dL). Liver Function Tests: Recent Labs  Lab 10/05/20 1932 10/08/20 0059 10/08/20 0837  AST 52* 50* 46*  ALT 31 31 28   ALKPHOS 82 74 68  BILITOT 0.6 0.4 0.8  PROT 7.4 6.2* 5.7*  ALBUMIN 4.1 3.7 3.3*   No results for input(s): LIPASE, AMYLASE in the last 168 hours. Recent Labs  Lab 10/08/20 0154  AMMONIA 15   Coagulation Profile: No results for input(s): INR, PROTIME in the last 168  hours. Cardiac Enzymes: No results for input(s): CKTOTAL, CKMB, CKMBINDEX, TROPONINI in the last 168 hours. BNP (last 3 results) No results for input(s): PROBNP in the last 8760 hours. HbA1C: No results for input(s): HGBA1C in the last 72 hours. CBG: No results for input(s): GLUCAP in the last 168 hours. Lipid Profile: No results for input(s): CHOL, HDL, LDLCALC, TRIG, CHOLHDL, LDLDIRECT in the last 72 hours. Thyroid Function Tests: Recent Labs    10/08/20 1443  TSH 4.191   Anemia Panel: Recent Labs    10/08/20 0837  VITAMINB12 398  FOLATE 11.4  FERRITIN 67  TIBC 371  IRON 81  RETICCTPCT 0.6   Sepsis Labs: No results for input(s): PROCALCITON, LATICACIDVEN in the last 168 hours.  Recent Results (from  the past 240 hour(s))  Resp Panel by RT-PCR (Flu A&B, Covid) Nasopharyngeal Swab     Status: None   Collection Time: 10/08/20  4:31 AM   Specimen: Nasopharyngeal Swab; Nasopharyngeal(NP) swabs in vial transport medium  Result Value Ref Range Status   SARS Coronavirus 2 by RT PCR NEGATIVE NEGATIVE Final    Comment: (NOTE) SARS-CoV-2 target nucleic acids are NOT DETECTED.  The SARS-CoV-2 RNA is generally detectable in upper respiratory specimens during the acute phase of infection. The lowest concentration of SARS-CoV-2 viral copies this assay can detect is 138 copies/mL. A negative result does not preclude SARS-Cov-2 infection and should not be used as the sole basis for treatment or other patient management decisions. A negative result may occur with  improper specimen collection/handling, submission of specimen other than nasopharyngeal swab, presence of viral mutation(s) within the areas targeted by this assay, and inadequate number of viral copies(<138 copies/mL). A negative result must be combined with clinical observations, patient history, and epidemiological information. The expected result is Negative.  Fact Sheet for Patients:   EntrepreneurPulse.com.au  Fact Sheet for Healthcare Providers:  IncredibleEmployment.be  This test is no t yet approved or cleared by the [place] FDA and  has been authorized for detection and/or diagnosis of SARS-CoV-2 by FDA under an Emergency Use Authorization (EUA). This EUA will remain  in effect (meaning this test can be used) for the duration of the COVID-19 declaration under Section 564(b)(1) of the Act, 21 U.S.C.section 360bbb-3(b)(1), unless the authorization is terminated  or revoked sooner.       Influenza A by PCR NEGATIVE NEGATIVE Final   Influenza B by PCR NEGATIVE NEGATIVE Final    Comment: (NOTE) The Xpert Xpress SARS-CoV-2/FLU/RSV plus assay is intended as an aid in the diagnosis of influenza from Nasopharyngeal swab specimens and should not be used as a sole basis for treatment. Nasal washings and aspirates are unacceptable for Xpert Xpress SARS-CoV-2/FLU/RSV testing.  Fact Sheet for Patients: EntrepreneurPulse.com.au  Fact Sheet for Healthcare Providers: IncredibleEmployment.be  This test is not yet approved or cleared by the [place] FDA and has been authorized for detection and/or diagnosis of SARS-CoV-2 by FDA under an Emergency Use Authorization (EUA). This EUA will remain in effect (meaning this test can be used) for the duration of the COVID-19 declaration under Section 564(b)(1) of the Act, 21 U.S.C. section 360bbb-3(b)(1), unless the authorization is terminated or revoked.  Performed at [facility], [place] [address] [place], [place] [address]      Radiology Studies: No results found.  Scheduled Meds: . enoxaparin (LOVENOX) injection  40 mg Subcutaneous Daily  . folic acid  1 mg Oral Daily  . lidocaine  1 patch Transdermal Q24H  . lidocaine  15 mL Oral Once  . magnesium oxide  400 mg Oral Daily  . pantoprazole  40 mg Oral Daily  . thiamine  100 mg  Oral Daily   Or  . thiamine  100 mg Intravenous Daily  . vitamin B-12  100 mcg Oral Daily   Continuous Infusions:   LOS: 2 days   Time spent: 30 minutes. More than 50% of the time was spent in counseling/coordination of care  Stacy Nimrod, MD [facility]  If 7PM-7AM, please contact night-coverage [email or web address]  10/10/2020, 3:00 PM   This record has been created using Systems analyst. Errors have been sought and corrected,but may not always be located. Such creation errors do not reflect on the standard of care.

## 2020-10-10 NOTE — Progress Notes (Signed)
Pt had burst SVT while resting in bed with eyes closed.  VSS.  Will continue to monitor patient closely.

## 2020-10-11 LAB — MAGNESIUM: Magnesium: 2.7 mg/dL — ABNORMAL HIGH (ref 1.7–2.4)

## 2020-10-11 MED ORDER — LORAZEPAM 1 MG PO TABS
1.0000 mg | ORAL_TABLET | ORAL | Status: DC | PRN
  Administered 2020-10-11 (×2): 1 mg via ORAL
  Administered 2020-10-11: 2 mg via ORAL
  Administered 2020-10-11 (×3): 1 mg via ORAL
  Filled 2020-10-11 (×2): qty 1
  Filled 2020-10-11: qty 2
  Filled 2020-10-11 (×2): qty 1

## 2020-10-11 MED ORDER — VITAMIN B-12 1000 MCG PO TABS: 1000.0000 ug | ORAL_TABLET | Freq: Every day | ORAL | 1 refills | Status: DC

## 2020-10-11 MED ORDER — LORAZEPAM 1 MG PO TABS: ORAL_TABLET | ORAL | 0 refills | Status: DC

## 2020-10-11 MED ORDER — LORAZEPAM 2 MG/ML IJ SOLN: 1.0000 mg | INTRAMUSCULAR | Status: DC | PRN

## 2020-10-11 MED ORDER — THIAMINE HCL 100 MG PO TABS: 100.0000 mg | ORAL_TABLET | Freq: Every day | ORAL | 0 refills | Status: DC

## 2020-10-11 MED ORDER — MAGNESIUM OXIDE 400 (241.3 MG) MG PO TABS: 400.0000 mg | ORAL_TABLET | Freq: Every day | ORAL | 0 refills | Status: DC

## 2020-10-11 MED ORDER — PANTOPRAZOLE SODIUM 40 MG PO TBEC: 40.0000 mg | DELAYED_RELEASE_TABLET | Freq: Every day | ORAL | 1 refills | Status: DC

## 2020-10-11 NOTE — Social Work (Addendum)
CSW received call from [place] with [facility].Levada Dy says she has concerns about taking patient back. Levada Dy explained that nurse Delilah Shan says patient has missed last 5 visits. Levada Dy reports that [employer] nurse says last time she saw patient she had to pick her up and put her in the bed.Levada Dy says that patient needs inpatient rehab. CSW informed RN, and Immunologist. CSW will continue to follow and assist with discharge planning needs.

## 2020-10-11 NOTE — Discharge Instructions (Signed)
Alcohol Withdrawal Syndrome When a person who drinks a lot of alcohol stops drinking, he or she may have unpleasant and serious symptoms. These symptoms are called alcohol withdrawal syndrome. This condition may be mild or severe. It can be life-threatening. It can cause:  Shaking that you cannot control (tremor).  Sweating.  Headache.  Feeling fearful, upset, grouchy, or depressed.  Trouble sleeping (insomnia).  Nightmares.  Fast or uneven heartbeats (palpitations).  Alcohol cravings.  Feeling sick to your stomach (nausea).  Throwing up (vomiting).  Being bothered by light and sounds.  Confusion.  Trouble thinking clearly.  Not being hungry (loss of appetite).  Big changes in mood (mood swings). If you have all of the following symptoms at the same time, get help right away:  High blood pressure.  Fast heartbeat.  Trouble breathing.  Seizures.  Seeing, hearing, feeling, smelling, or tasting things that are not there (hallucinations). These symptoms are known as delirium tremens (DTs). They must be treated at the hospital right away. Follow these instructions at home:  Take over-the-counter and prescription medicines only as told by your doctor. This includes vitamins.  Do not drink alcohol.  Do not drive until your doctor says that this is safe for you.  Have someone stay with you or be available in case you need help. This should be someone you trust. This person can help you with your symptoms. He or she can also help you to not drink.  Drink enough fluid to keep your pee (urine) pale yellow.  Think about joining a support group or a treatment program to help you stop drinking.  Keep all follow-up visits as told by your doctor. This is important.   Contact a doctor if:  Your symptoms get worse.  You cannot eat or drink without throwing up.  You have a hard time not drinking alcohol.  You cannot stop drinking alcohol. Get help right away  if:  You have fast or uneven heartbeats.  You have chest pain.  You have trouble breathing.  You have a seizure for the first time.  You see, hear, feel, smell, or taste something that is not there.  You get very confused. Summary  When a person who drinks a lot of alcohol stops drinking, he or she may have serious symptoms. This is called alcohol withdrawal syndrome.  Delirium tremens (DTs) is a group of life-threatening symptoms. You should get help right away if you have these symptoms.  Think about joining an alcohol support group or a treatment program. This information is not intended to replace advice given to you by your health care provider. Make sure you discuss any questions you have with your health care provider. Document Revised: 06/01/2017 Document Reviewed: 02/23/2017 Elsevier Patient Education  2021 [employer].

## 2020-10-11 NOTE — TOC Transition Note (Signed)
Transition of Care [facility]) - CM/SW Discharge Note   Patient Details  Name: TREMEKA HELBLING MRN: [id number] Date of Birth: Sep 07, 1950  [facility] CM/SW Contact:  Trula Ore, [place] Phone Number: 10/11/2020, 2:24 PM   Clinical Narrative:     Patient will DC to: Home address [place] [address]  Anticipated DC date: 10/11/20  Family notified: [name]  Transport by: Larence Penning Transportation   ?  Per MD patient ready for DC to home . RN, patient, patient's family, notified of DC. [facility] transport requested for patient.  CSW signing off.          Patient Goals and CMS Choice        Discharge Placement                       Discharge Plan and Services                                     Social Determinants of Health (SDOH) Interventions     Readmission Risk Interventions No flowsheet data found.

## 2020-10-11 NOTE — Telephone Encounter (Signed)
Pt is currently hospitalized.

## 2020-10-11 NOTE — Progress Notes (Signed)
Pt is alert and oriented. Discharge instructions/ AVS given to pt. 

## 2020-10-11 NOTE — Telephone Encounter (Signed)
Left detailed message.   

## 2020-10-11 NOTE — Discharge Summary (Signed)
Physician Discharge Summary  Stacy Dalton TKP:[id number] DOB: 1950-11-20 DOA: 10/08/2020  PCP: [name], FNP  Admit date: 10/08/2020 Discharge date: 10/11/2020  Admitted From: Home Disposition: Home  Recommendations for Outpatient Follow-up:  1. Follow up with PCP in 1-2 weeks 2. Please obtain BMP/CBC in one week 3. Please follow up on the following pending results: None  Home Health: Yes Equipment/Devices: Rolling walker Discharge Condition: Stable CODE STATUS: Full Diet recommendation: Heart Healthy   Brief/Interim Summary: Stacy Cake Atkinsonis a 70 y.o.femalewith medical history significant ofalcohol dependence, COPD, chronic pain, hypertension, hyperlipidemia, hypothyroidism, migraine, panic attacks, anxiety, depression requesting alcohol detox. Labs done in the ED showing WBC 4.7, hemoglobin 11.6, MCV 84.6, platelet count 336K.Sodium 140, potassium 3.5, chloride 102, bicarb 26, BUN 17, creatinine 1.0, glucose 107. AST mildly elevated at 50. Remainder of LFTs normal. Blood ethanol level 182. UDS negative. Acetaminophen and salicylate levels undetectable. Magnesium 1.3. Ammonia level normal. Screening Covid and influenza PCR pending. Initially in the ED, patient was not displaying any signs of withdrawal but later became hypertensive, tachycardic, and tremulous. CIWA protocol initiated. Patient wants to get detox and promised not to use alcohol again as it almost killed me.  Patient was experiencing some anxiety and jitteriness secondary to alcohol withdrawal which was helped with Ativan.  We tried putting her on Librium but she refused stating that something that happened when she took Librium before.  Librium is listed as allergies and reaction is showing as being unconscious, not sure about the dose.  She was discharged home on a tapering dose of Ativan and resources were provided for alcohol cessation.  Had extensive counseling done during hospitalization and  not to combine Ativan with alcohol.  Patient also complaining of epigastric and right upper quadrant pain.  Right upper quadrant ultrasound was without any significant abnormality.  Pain improved with GI cocktail.  We added Protonix and she can follow-up with her provider for further recommendations.  Patient had some electrolyte abnormalities which includes mild hyponatremia, hypomagnesemia and mild transaminitis most likely secondary to alcohol use.  Electrolytes were repleted and she was sent home on magnesium supplement.  She has no motor or sensory deficit on exam of her lower extremities. No complaints of saddle anesthesia or bowel/bladder dysfunction.Unable to localize site of pain asshe reports pain all over her back. Spine nontender to palpation. She was seen in the ED 3 days ago for the same problem and lumbar x-ray done at that time was negative. Patient is requesting tramadol for her back pain and claiming that her pain pills keep disappearing at home. Daughter-in-law at bedside is concerned about patient's use of pain medications and reportedly she overdosed recently and had to be given Narcan.  She was not provided with any extra tramadol on discharge and will follow up with her providers for further recommendations.  Patient will continue rest of her home medications and follow-up with her providers.  Discharge Diagnoses:  Principal Problem:   Alcohol dependence with withdrawal with complication ([place]) Active Problems:   COPD (chronic obstructive pulmonary disease) (HCC)   Hypertension   Chronic back pain   HLD (hyperlipidemia)   Discharge Instructions  Discharge Instructions    Diet - low sodium heart healthy   Complete by: As directed    Discharge instructions   Complete by: As directed    It was pleasure taking care of you. You are being given some Ativan to help with this jitteriness which is related to alcohol withdrawal.  It is very important that you do not  drink at all and do not combine Ativan with alcohol as it can kill you. You are also being given some resources to help you stay sober. Continue taking rest of your home medications and follow-up with your primary care provider.   Increase activity slowly   Complete by: As directed      Allergies as of 10/11/2020      Reactions   Librium [chlordiazepoxide]    Became unresponsive   Toradol [ketorolac Tromethamine] Shortness Of Breath   Butalbital-apap-caffeine Other (See Comments)   jittery   Demerol Swelling   Esgic [butalbital-apap-caffeine] Other (See Comments)   jittery   Iodine Other (See Comments)   bp bottomed out per pt several hours later; unsure if pre medicated in past with cm; done in [place]      Medication List    STOP taking these medications   albuterol (2.5 MG/3ML) 0.083% nebulizer solution Commonly known as: PROVENTIL   chlordiazePOXIDE 25 MG capsule Commonly known as: LIBRIUM     TAKE these medications   amLODipine-olmesartan 10-40 MG tablet Commonly known as: AZOR Take 1 tablet by mouth daily.   atorvastatin 20 MG tablet Commonly known as: Lipitor Take 1 tablet (20 mg total) by mouth daily.   BC Fast Pain Relief 845-65 MG Pack Generic drug: Aspirin-Caffeine Take 1 packet by mouth daily as needed (pain or headache).   benzonatate 200 MG capsule Commonly known as: TESSALON Take 1 capsule (200 mg total) by mouth 3 (three) times daily as needed.   Breo Ellipta 200-25 MCG/INH Aepb Generic drug: fluticasone furoate-vilanterol Inhale 1 puff into the lungs daily.   DULoxetine 60 MG capsule Commonly known as: CYMBALTA Take 1 capsule (60 mg total) by mouth daily.   Fluticasone-Salmeterol 100-50 MCG/DOSE Aepb Commonly known as: ADVAIR Inhale 1 puff into the lungs daily as needed (shortness of breath).   folic acid 1 MG tablet Commonly known as: FOLVITE Take 1 tablet (1 mg total) by mouth daily.   hydrOXYzine 25 MG capsule Commonly known as:  Vistaril Take 1 capsule (25 mg total) by mouth 3 (three) times daily as needed for anxiety.   levothyroxine 50 MCG tablet Commonly known as: Synthroid Take 1 tablet (50 mcg total) by mouth daily before breakfast.   LORazepam 1 MG tablet Commonly known as: Ativan Take 3 times a day for 1 day, decrease to twice daily for next 2 days and just take 1 tablet a day preferably at bedtime for next couple of days before stopping it.   magnesium oxide 400 (241.3 Mg) MG tablet Commonly known as: MAG-OX Take 1 tablet (400 mg total) by mouth daily. Start taking on: October 12, 2020   ondansetron 4 MG tablet Commonly known as: ZOFRAN TAKE 1 TABLET(4 MG) BY MOUTH EVERY 8 HOURS AS NEEDED FOR NAUSEA What changed: See the new instructions.   pantoprazole 40 MG tablet Commonly known as: PROTONIX Take 1 tablet (40 mg total) by mouth daily. Start taking on: October 12, 2020   thiamine 100 MG tablet Take 1 tablet (100 mg total) by mouth daily. Start taking on: October 12, 2020   traMADol 50 MG tablet Commonly known as: ULTRAM Take 50 mg by mouth every 6 (six) hours as needed for moderate pain.   vitamin B-12 1000 MCG tablet Commonly known as: CYANOCOBALAMIN Take 1 tablet (1,000 mcg total) by mouth daily. Start taking on: October 12, 2020   Vitamin D (Ergocalciferol) 1.25 MG (  50000 UNIT) Caps capsule Commonly known as: DRISDOL Take 1 capsule (50,000 Units total) by mouth every 7 (seven) days.       Follow-up Information    [name], FNP. Schedule an appointment as soon as possible for a visit.   Specialty: Family Medicine Contact information: [place] [address] [phone]              Allergies  Allergen Reactions  . Librium [Chlordiazepoxide]     Became unresponsive  . Toradol [Ketorolac Tromethamine] Shortness Of Breath  . Butalbital-Apap-Caffeine Other (See Comments)    jittery  . Demerol Swelling  . Esgic [Butalbital-Apap-Caffeine] Other (See  Comments)    jittery  . Iodine Other (See Comments)    bp bottomed out per pt several hours later; unsure if pre medicated in past with cm; done in [place]    Consultations:  None  Procedures/Studies: DG Lumbar Spine Complete  Result Date: 10/05/2020 CLINICAL DATA:  Pain EXAM: LUMBAR SPINE - COMPLETE 4+ VIEW COMPARISON:  None. FINDINGS: There is no evidence of lumbar spine fracture. Alignment is normal. Intervertebral disc spaces are maintained. IMPRESSION: No acute osseous abnormality Electronically Signed   By: Prudencio Pair M.D.   On: 10/05/2020 20:04   US Abdomen Limited RUQ (LIVER/GB)  Result Date: 10/08/2020 CLINICAL DATA:  Elevated liver function tests EXAM: ULTRASOUND ABDOMEN LIMITED RIGHT UPPER QUADRANT COMPARISON:  Abdomen and pelvis CT 07/20/2018 FINDINGS: Gallbladder: Incomplete distension. No wall thickening or focal tenderness. Negative for calculus. Common bile duct: Diameter: 6 mm.  Where visualized, no filling defect. Liver: No focal lesion identified. Within normal limits in parenchymal echogenicity. Portal vein is patent on color Doppler imaging with normal direction of blood flow towards the liver. IMPRESSION: Negative right upper quadrant ultrasound. Electronically Signed   By: Monte Fantasia M.D.   On: 10/08/2020 06:41     Subjective: Patient was seen and examined today.  No new complaint.  She wants to go home.  We discussed about how to taper Ativan and not to combine with alcohol.  Per patient she will not going to drink anymore.  Discharge Exam: Vitals:   10/11/20 1054 10/11/20 1133  BP: (!) 152/79 139/84  Pulse: 80 86  Resp:    Temp:    SpO2:     Vitals:   10/11/20 0913 10/11/20 1037 10/11/20 1054 10/11/20 1133  BP: (!) 156/92 (!) 156/88 (!) 152/79 139/84  Pulse: 80 85 80 86  Resp:      Temp:      TempSrc:      SpO2:      Weight:      Height:        General: Pt is alert, awake, not in acute distress Cardiovascular: RRR, S1/S2 +, no rubs, no  gallops Respiratory: CTA bilaterally, no wheezing, no rhonchi Abdominal: Soft, NT, ND, bowel sounds + Extremities: no edema, no cyanosis   The results of significant diagnostics from this hospitalization (including imaging, microbiology, ancillary and laboratory) are listed below for reference.    Microbiology: Recent Results (from the past 240 hour(s))  Resp Panel by RT-PCR (Flu A&B, Covid) Nasopharyngeal Swab     Status: None   Collection Time: 10/08/20  4:31 AM   Specimen: Nasopharyngeal Swab; Nasopharyngeal(NP) swabs in vial transport medium  Result Value Ref Range Status   SARS Coronavirus 2 by RT PCR NEGATIVE NEGATIVE Final    Comment: (NOTE) SARS-CoV-2 target nucleic acids are NOT DETECTED.  The SARS-CoV-2  RNA is generally detectable in upper respiratory specimens during the acute phase of infection. The lowest concentration of SARS-CoV-2 viral copies this assay can detect is 138 copies/mL. A negative result does not preclude SARS-Cov-2 infection and should not be used as the sole basis for treatment or other patient management decisions. A negative result may occur with  improper specimen collection/handling, submission of specimen other than nasopharyngeal swab, presence of viral mutation(s) within the areas targeted by this assay, and inadequate number of viral copies(<138 copies/mL). A negative result must be combined with clinical observations, patient history, and epidemiological information. The expected result is Negative.  Fact Sheet for Patients:  EntrepreneurPulse.com.au  Fact Sheet for Healthcare Providers:  IncredibleEmployment.be  This test is no t yet approved or cleared by the [place] FDA and  has been authorized for detection and/or diagnosis of SARS-CoV-2 by FDA under an Emergency Use Authorization (EUA). This EUA will remain  in effect (meaning this test can be used) for the duration of the COVID-19  declaration under Section 564(b)(1) of the Act, 21 U.S.C.section 360bbb-3(b)(1), unless the authorization is terminated  or revoked sooner.       Influenza A by PCR NEGATIVE NEGATIVE Final   Influenza B by PCR NEGATIVE NEGATIVE Final    Comment: (NOTE) The Xpert Xpress SARS-CoV-2/FLU/RSV plus assay is intended as an aid in the diagnosis of influenza from Nasopharyngeal swab specimens and should not be used as a sole basis for treatment. Nasal washings and aspirates are unacceptable for Xpert Xpress SARS-CoV-2/FLU/RSV testing.  Fact Sheet for Patients: EntrepreneurPulse.com.au  Fact Sheet for Healthcare Providers: IncredibleEmployment.be  This test is not yet approved or cleared by the [place] FDA and has been authorized for detection and/or diagnosis of SARS-CoV-2 by FDA under an Emergency Use Authorization (EUA). This EUA will remain in effect (meaning this test can be used) for the duration of the COVID-19 declaration under Section 564(b)(1) of the Act, 21 U.S.C. section 360bbb-3(b)(1), unless the authorization is terminated or revoked.  Performed at [facility], [place] [address] [place], [place] [address]      Labs: BNP (last 3 results) No results for input(s): BNP in the last 8760 hours. Basic Metabolic Panel: Recent Labs  Lab 10/05/20 1932 10/08/20 0059 10/08/20 0837 10/09/20 0300 10/10/20 0111 10/11/20 0216  NA 144 140 136  --  134*  --   K 5.0 3.5 3.7  --  3.9  --   CL 105 102 104  --  100  --   CO2 22 26 23   --  26  --   GLUCOSE 90 107* 102*  --  105*  --   BUN 29* 17 16  --  24*  --   CREATININE 0.96 1.08* 0.83  --  0.80  --   CALCIUM 10.2 9.1 8.5*  --  8.9  --   MG  --  1.3* 1.9 1.6* 1.6* 2.7*  PHOS  --   --  3.4  --  4.1  --    Liver Function Tests: Recent Labs  Lab 10/05/20 1932 10/08/20 0059 10/08/20 0837  AST 52* 50* 46*  ALT 31 31 28   ALKPHOS 82 74 68  BILITOT 0.6 0.4 0.8  PROT 7.4 6.2*  5.7*  ALBUMIN 4.1 3.7 3.3*   No results for input(s): LIPASE, AMYLASE in the last 168 hours. Recent Labs  Lab 10/08/20 0154  AMMONIA 15   CBC: Recent Labs  Lab 10/05/20 1932 10/08/20 0059 10/10/20 0111  WBC 5.1 4.7 6.3  NEUTROABS 2.2  --   --   HGB 13.0 11.6* 10.8*  HCT 41.1 35.7* 33.8*  MCV 86.0 84.6 83.5  PLT 471* 336 204   Cardiac Enzymes: No results for input(s): CKTOTAL, CKMB, CKMBINDEX, TROPONINI in the last 168 hours. BNP: Invalid input(s): POCBNP CBG: No results for input(s): GLUCAP in the last 168 hours. D-Dimer No results for input(s): DDIMER in the last 72 hours. Hgb A1c No results for input(s): HGBA1C in the last 72 hours. Lipid Profile No results for input(s): CHOL, HDL, LDLCALC, TRIG, CHOLHDL, LDLDIRECT in the last 72 hours. Thyroid function studies Recent Labs    10/08/20 1443  TSH 4.191   Anemia work up No results for input(s): VITAMINB12, FOLATE, FERRITIN, TIBC, IRON, RETICCTPCT in the last 72 hours. Urinalysis    Component Value Date/Time   COLORURINE YELLOW 02/20/2020 0241   APPEARANCEUR CLEAR 02/20/2020 0241   APPEARANCEUR Hazy (A) 05/23/2018 1543   LABSPEC 1.010 02/20/2020 0241   PHURINE 7.0 02/20/2020 0241   GLUCOSEU 50 (A) 02/20/2020 0241   HGBUR NEGATIVE 02/20/2020 0241   BILIRUBINUR NEGATIVE 02/20/2020 0241   BILIRUBINUR Negative 05/23/2018 1543   KETONESUR NEGATIVE 02/20/2020 0241   PROTEINUR NEGATIVE 02/20/2020 0241   UROBILINOGEN negative 07/01/2015 1541   UROBILINOGEN 0.2 10/07/2012 1928   NITRITE NEGATIVE 02/20/2020 0241   LEUKOCYTESUR LARGE (A) 02/20/2020 0241   Sepsis Labs Invalid input(s): PROCALCITONIN,  WBC,  LACTICIDVEN Microbiology Recent Results (from the past 240 hour(s))  Resp Panel by RT-PCR (Flu A&B, Covid) Nasopharyngeal Swab     Status: None   Collection Time: 10/08/20  4:31 AM   Specimen: Nasopharyngeal Swab; Nasopharyngeal(NP) swabs in vial transport medium  Result Value Ref Range Status   SARS  Coronavirus 2 by RT PCR NEGATIVE NEGATIVE Final    Comment: (NOTE) SARS-CoV-2 target nucleic acids are NOT DETECTED.  The SARS-CoV-2 RNA is generally detectable in upper respiratory specimens during the acute phase of infection. The lowest concentration of SARS-CoV-2 viral copies this assay can detect is 138 copies/mL. A negative result does not preclude SARS-Cov-2 infection and should not be used as the sole basis for treatment or other patient management decisions. A negative result may occur with  improper specimen collection/handling, submission of specimen other than nasopharyngeal swab, presence of viral mutation(s) within the areas targeted by this assay, and inadequate number of viral copies(<138 copies/mL). A negative result must be combined with clinical observations, patient history, and epidemiological information. The expected result is Negative.  Fact Sheet for Patients:  EntrepreneurPulse.com.au  Fact Sheet for Healthcare Providers:  IncredibleEmployment.be  This test is no t yet approved or cleared by the [place] FDA and  has been authorized for detection and/or diagnosis of SARS-CoV-2 by FDA under an Emergency Use Authorization (EUA). This EUA will remain  in effect (meaning this test can be used) for the duration of the COVID-19 declaration under Section 564(b)(1) of the Act, 21 U.S.C.section 360bbb-3(b)(1), unless the authorization is terminated  or revoked sooner.       Influenza A by PCR NEGATIVE NEGATIVE Final   Influenza B by PCR NEGATIVE NEGATIVE Final    Comment: (NOTE) The Xpert Xpress SARS-CoV-2/FLU/RSV plus assay is intended as an aid in the diagnosis of influenza from Nasopharyngeal swab specimens and should not be used as a sole basis for treatment. Nasal washings and aspirates are unacceptable for Xpert Xpress SARS-CoV-2/FLU/RSV testing.  Fact Sheet for  Patients: EntrepreneurPulse.com.au  Fact Sheet for Healthcare Providers:  IncredibleEmployment.be  This test is not yet approved or cleared by the [place] and has been authorized for detection and/or diagnosis of SARS-CoV-2 by FDA under an Emergency Use Authorization (EUA). This EUA will remain in effect (meaning this test can be used) for the duration of the COVID-19 declaration under Section 564(b)(1) of the Act, 21 U.S.C. section 360bbb-3(b)(1), unless the authorization is terminated or revoked.  Performed at [facility], [place] [address] [place], [place] [address]     Time coordinating discharge: Over 30 minutes  SIGNED:  Lorella Nimrod, MD  [facility] 10/11/2020, 11:58 AM  If 7PM-7AM, please contact night-coverage [email or web address]  This record has been created using Systems analyst. Errors have been sought and corrected,but may not always be located. Such creation errors do not reflect on the standard of care.

## 2020-10-14 DIAGNOSIS — I10 Essential (primary) hypertension: Secondary | ICD-10-CM | POA: Diagnosis not present

## 2020-10-14 DIAGNOSIS — R404 Transient alteration of awareness: Secondary | ICD-10-CM | POA: Diagnosis not present

## 2020-10-24 DIAGNOSIS — R000 Tachycardia, unspecified: Secondary | ICD-10-CM | POA: Diagnosis not present

## 2020-10-24 DIAGNOSIS — R0789 Other chest pain: Secondary | ICD-10-CM | POA: Diagnosis not present

## 2020-10-24 DIAGNOSIS — R0689 Other abnormalities of breathing: Secondary | ICD-10-CM | POA: Diagnosis not present

## 2020-10-24 DIAGNOSIS — R079 Chest pain, unspecified: Secondary | ICD-10-CM | POA: Diagnosis not present

## 2020-10-24 DIAGNOSIS — I10 Essential (primary) hypertension: Secondary | ICD-10-CM | POA: Diagnosis not present

## 2020-10-26 ENCOUNTER — Encounter (HOSPITAL_COMMUNITY): Payer: Self-pay | Admitting: *Deleted

## 2020-10-26 ENCOUNTER — Emergency Department (HOSPITAL_COMMUNITY)
Admission: EM | Admit: 2020-10-26 | Discharge: 2020-10-26 | Disposition: A | Discharge status: Home / Self Care | Payer: Medicare Other | Attending: Emergency Medicine | Admitting: Emergency Medicine

## 2020-10-26 ENCOUNTER — Other Ambulatory Visit: Payer: Self-pay

## 2020-10-26 DIAGNOSIS — I10 Essential (primary) hypertension: Secondary | ICD-10-CM | POA: Diagnosis not present

## 2020-10-26 DIAGNOSIS — Z79899 Other long term (current) drug therapy: Secondary | ICD-10-CM | POA: Insufficient documentation

## 2020-10-26 DIAGNOSIS — Z7952 Long term (current) use of systemic steroids: Secondary | ICD-10-CM | POA: Insufficient documentation

## 2020-10-26 DIAGNOSIS — E039 Hypothyroidism, unspecified: Secondary | ICD-10-CM | POA: Diagnosis not present

## 2020-10-26 DIAGNOSIS — J449 Chronic obstructive pulmonary disease, unspecified: Secondary | ICD-10-CM | POA: Insufficient documentation

## 2020-10-26 DIAGNOSIS — Z87891 Personal history of nicotine dependence: Secondary | ICD-10-CM | POA: Insufficient documentation

## 2020-10-26 DIAGNOSIS — Y908 Blood alcohol level of 240 mg/100 ml or more: Secondary | ICD-10-CM | POA: Diagnosis not present

## 2020-10-26 DIAGNOSIS — F10229 Alcohol dependence with intoxication, unspecified: Secondary | ICD-10-CM | POA: Insufficient documentation

## 2020-10-26 DIAGNOSIS — F10220 Alcohol dependence with intoxication, uncomplicated: Secondary | ICD-10-CM

## 2020-10-26 LAB — RAPID URINE DRUG SCREEN, HOSP PERFORMED
Amphetamines: NOT DETECTED
Barbiturates: POSITIVE — AB
Benzodiazepines: NOT DETECTED
Cocaine: NOT DETECTED
Opiates: NOT DETECTED
Tetrahydrocannabinol: NOT DETECTED

## 2020-10-26 LAB — CBC WITH DIFFERENTIAL/PLATELET
Abs Immature Granulocytes: 0.03 10*3/uL (ref 0.00–0.07)
Basophils Absolute: 0 10*3/uL (ref 0.0–0.1)
Basophils Relative: 0 %
Eosinophils Absolute: 0.3 10*3/uL (ref 0.0–0.5)
Eosinophils Relative: 5 %
HCT: 31.2 % — ABNORMAL LOW (ref 36.0–46.0)
Hemoglobin: 9.7 g/dL — ABNORMAL LOW (ref 12.0–15.0)
Immature Granulocytes: 1 %
Lymphocytes Relative: 23 %
Lymphs Abs: 1.4 10*3/uL (ref 0.7–4.0)
MCH: 27.6 pg (ref 26.0–34.0)
MCHC: 31.1 g/dL (ref 30.0–36.0)
MCV: 88.9 fL (ref 80.0–100.0)
Monocytes Absolute: 0.5 10*3/uL (ref 0.1–1.0)
Monocytes Relative: 9 %
Neutro Abs: 3.8 10*3/uL (ref 1.7–7.7)
Neutrophils Relative %: 62 %
Platelets: 176 10*3/uL (ref 150–400)
RBC: 3.51 MIL/uL — ABNORMAL LOW (ref 3.87–5.11)
RDW: 18.3 % — ABNORMAL HIGH (ref 11.5–15.5)
WBC: 6.1 10*3/uL (ref 4.0–10.5)
nRBC: 0 % (ref 0.0–0.2)

## 2020-10-26 LAB — COMPREHENSIVE METABOLIC PANEL
ALT: 37 U/L (ref 0–44)
AST: 66 U/L — ABNORMAL HIGH (ref 15–41)
Albumin: 3.6 g/dL (ref 3.5–5.0)
Alkaline Phosphatase: 67 U/L (ref 38–126)
Anion gap: 12 (ref 5–15)
BUN: 17 mg/dL (ref 8–23)
CO2: 20 mmol/L — ABNORMAL LOW (ref 22–32)
Calcium: 8.5 mg/dL — ABNORMAL LOW (ref 8.9–10.3)
Chloride: 101 mmol/L (ref 98–111)
Creatinine, Ser: 0.87 mg/dL (ref 0.44–1.00)
GFR, Estimated: >60 mL/min (ref >60)
Glucose, Bld: 170 mg/dL — ABNORMAL HIGH (ref 70–99)
Potassium: 3.3 mmol/L — ABNORMAL LOW (ref 3.5–5.1)
Sodium: 133 mmol/L — ABNORMAL LOW (ref 135–145)
Total Bilirubin: 0.6 mg/dL (ref 0.3–1.2)
Total Protein: 6.2 g/dL — ABNORMAL LOW (ref 6.5–8.1)

## 2020-10-26 LAB — LIPASE, BLOOD: Lipase: 36 U/L (ref 11–51)

## 2020-10-26 LAB — ETHANOL: Alcohol, Ethyl (B): 264 mg/dL — ABNORMAL HIGH (ref <10)

## 2020-10-26 MED ORDER — LORAZEPAM 2 MG/ML IJ SOLN: 0.0000 mg | Freq: Four times a day (QID) | INTRAMUSCULAR | Status: DC

## 2020-10-26 MED ORDER — THIAMINE HCL 100 MG/ML IJ SOLN
100.0000 mg | Freq: Every day | INTRAMUSCULAR | Status: DC
  Filled 2020-10-26: qty 2

## 2020-10-26 MED ORDER — THIAMINE HCL 100 MG PO TABS
100.0000 mg | ORAL_TABLET | Freq: Every day | ORAL | Status: DC
  Administered 2020-10-26: 100 mg via ORAL
  Filled 2020-10-26: qty 1

## 2020-10-26 MED ORDER — CHLORDIAZEPOXIDE HCL 25 MG PO CAPS: ORAL_CAPSULE | ORAL | 0 refills | Status: DC

## 2020-10-26 MED ORDER — LORAZEPAM 1 MG PO TABS
1.0000 mg | ORAL_TABLET | Freq: Once | ORAL | Status: AC
  Administered 2020-10-26: 1 mg via ORAL
  Filled 2020-10-26: qty 1

## 2020-10-26 MED ORDER — LORAZEPAM 1 MG PO TABS
0.0000 mg | ORAL_TABLET | Freq: Four times a day (QID) | ORAL | Status: DC
  Administered 2020-10-26: 2 mg via ORAL
  Filled 2020-10-26: qty 2

## 2020-10-26 NOTE — ED Notes (Signed)
Pt ambulated to and from bathroom with NT ensuring safety. Pt back to room. Family in room.

## 2020-10-26 NOTE — ED Notes (Signed)
PA at beside speaking with pt.

## 2020-10-26 NOTE — ED Notes (Signed)
Pt asked for something to eat; PA gave verbal OK for pt to be fed. Pt reports ongoing back pain still. Updated PA.

## 2020-10-26 NOTE — ED Notes (Signed)
Pt provided dinner tray in room. Significant other remains present with her.

## 2020-10-26 NOTE — ED Provider Notes (Signed)
[facility] Provider Note   CSN: [id number] Arrival date & time: 10/26/20  1314     History Chief Complaint  Patient presents with  . Alcohol Intoxication    Stacy Dalton is a 70 y.o. female with a history including chronic back pain, COPD, GERD, hypertension, and alcohol and opioid dependence presenting for assistance with alcohol abuse.  She was seen at [facility] today asking for assistance with placement in alcohol rehab facility and she states that she failed a breathalyzer test and was sent here for detox.  She she reports drinking daily, her neighbor who is at the bedside endorses her history, she drank until midmorning this morning and also had a drink out in her car while awaiting bed placement here today.  She reports feeling very jittery and nauseous if she goes more than 6 or so hours without EtOH intake.  She denies ever being in full-blown DTs.  She states a year ago she was not an alcoholic, but it was suggested that alcohol would help with her back pain and at that time she was not being adequately treated with pain medication and thought this was a good substitute.  She does however currently take tramadol every 6 hours for pain relief.  She denies nausea or vomiting, denies abdominal pain.  She also denies auditory or visual hallucinations, no SI or HI.  She denies any other substance abuse.  HPI     Past Medical History:  Diagnosis Date  . Chronic pain   . COPD (chronic obstructive pulmonary disease) ([place])    told has copd, no current inhaler use  . Cough   . Depression   . GERD (gastroesophageal reflux disease)   . Hypertension   . Hypothyroidism   . Insomnia   . Migraine   . Migraine   . Osteopenia   . Pain management   . Panic attacks   . Small bowel obstruction [facility])     Patient Active Problem List   Diagnosis Date Noted  . Alcohol dependence with withdrawal with complication ([place]) [id number]  . Alcohol abuse 06/21/2020  .  Multifocal pneumonia 09/19/2018  . Large bowel obstruction ([place])   . Small bowel obstruction ([place]) 07/20/2018  . Hypomagnesemia 07/20/2018  . Cough 03/14/2018  . Fatigue 03/14/2018  . Polypharmacy 02/23/2017  . Hypokalemia 02/23/2017  . Toxic metabolic encephalopathy [id number]  . Anxiety 02/22/2017  . GERD (gastroesophageal reflux disease) 02/22/2017  . Pain medication agreement signed 10/10/2016  . Opioid dependence ([place]) 10/10/2016  . Status post colostomy takedown 04/12/2016  . Chronic constipation 12/03/2015  . Peritonitis with abscess of intestine ([place]) 11/29/2015  . COPD exacerbation ([place]) 09/20/2015  . Osteopenia 06/10/2014  . Vitamin D deficiency 06/10/2014  . Hypothyroidism   . Depression 11/18/2012  . Insomnia 11/18/2012  . Chronic pain syndrome 11/18/2012  . HLD (hyperlipidemia) 09/01/2012  . S/P colostomy ([place]) 04/19/2012  . Normocytic anemia 10/15/2011  . Hypertension 10/13/2011  . Chronic back pain 10/13/2011  . COPD (chronic obstructive pulmonary disease) ([place]) 10/09/2011    Past Surgical History:  Procedure Laterality Date  . ABDOMINAL HYSTERECTOMY    . COLON SURGERY    . COLOSTOMY CLOSURE  04/19/2012   Procedure: COLOSTOMY CLOSURE;  Surgeon: Adin Hector, MD;  Location: [facility];  Service: General;  Laterality: N/A;  Laparotomy, Resection and Closure of Colostomy  . COLOSTOMY TAKEDOWN N/A 04/12/2016   Procedure: [place] TAKEDOWN;  Surgeon: Johnathan Hausen, MD;  Location: [facility];  Service:  General;  Laterality: N/A;  . INCONTINENCE SURGERY    . LAPAROTOMY  10/04/2011, colostomy also   Procedure: EXPLORATORY LAPAROTOMY;  Surgeon: Adin Hector, MD;  Location: [facility];  Service: General;  Laterality: N/A;  left partial colectomy with colostomy  . LAPAROTOMY  04/19/2012   Procedure: EXPLORATORY LAPAROTOMY;  Surgeon: Adin Hector, MD;  Location: [facility];  Service: General;  Laterality: N/A;  . LAPAROTOMY N/A 11/29/2015   Procedure: EXPLORATORY LAPAROTOMY;  SUBTOTAL COLECTOMY WITH HARTMAN PROCEDURE AND END COLOSTOMY;  Surgeon: Johnathan Hausen, MD;  Location: [facility];  Service: General;  Laterality: N/A;  . TUBAL LIGATION    . VENTRAL HERNIA REPAIR  04/19/2012   Procedure: HERNIA REPAIR VENTRAL ADULT;  Surgeon: Adin Hector, MD;  Location: [facility];  Service: General;  Laterality: N/A;     OB History    Gravida      Para      Term      Preterm      AB      Living  5     SAB      IAB      Ectopic      Multiple      Live Births              Family History  Problem Relation Age of Onset  . Heart disease Mother   . Hypertension Mother   . Cancer Sister        sinus  . Diabetes Brother   . Heart disease Brother   . Thyroid disease Brother   . Cancer Sister        abdominal ?  . Thyroid disease Sister   . Cancer Other        GE junction adenocarcinoma  . Emphysema Father   . Stroke Father   . Hypertension Father   . Heart disease Father   . COPD Sister   . Thyroid disease Sister   . Thyroid disease Sister   . Diabetes Brother   . Thyroid disease Brother   . Thyroid disease Brother   . Hypertension Brother   . Post-traumatic stress disorder Brother   . COPD Brother   . Thyroid disease Brother   . Thyroid disease Brother   . Hypertension Brother   . Transient ischemic attack Brother     Social History   Tobacco Use  . Smoking status: Former Smoker    Packs/day: 1.50    Years: 20.00    Pack years: 30.00    Types: Cigarettes    Quit date: 10/04/1998    Years since quitting: 22.0  . Smokeless tobacco: Never Used  Vaping Use  . Vaping Use: Never used  Substance Use Topics  . Alcohol use: Yes    Comment: 1bottle vodka -3 days   . Drug use: No    Home Medications Prior to Admission medications   Medication Sig Start Date End Date Taking? Authorizing Provider  chlordiazePOXIDE (LIBRIUM) 25 MG capsule 50mg  PO TID x 1D, then 25-50mg  PO BID X 1D, then 25-50mg  PO QD X 1D 10/26/20  Yes Nghia Mcentee, Almyra Free, PA-C   amLODipine-olmesartan (AZOR) 10-40 MG tablet Take 1 tablet by mouth daily. 09/27/20   Evelina Dun A, FNP  Aspirin-Caffeine (BC FAST PAIN RELIEF) 845-65 MG PACK Take 1 packet by mouth daily as needed (pain or headache).    [provider]  atorvastatin (LIPITOR) 20 MG tablet Take 1 tablet (20 mg total) by mouth daily. 07/18/19  07/17/20  [name], FNP  benzonatate (TESSALON) 200 MG capsule Take 1 capsule (200 mg total) by mouth 3 (three) times daily as needed. 08/30/20   [name], FNP  DULoxetine (CYMBALTA) 60 MG capsule Take 1 capsule (60 mg total) by mouth daily. 04/27/20   Hawks, Alyse Low A, FNP  fluticasone furoate-vilanterol (BREO ELLIPTA) 200-25 MCG/INH AEPB Inhale 1 puff into the lungs daily. 01/16/20   Evelina Dun A, FNP  Fluticasone-Salmeterol (ADVAIR) 100-50 MCG/DOSE AEPB Inhale 1 puff into the lungs daily as needed (shortness of breath). 02/09/20   [provider]  folic acid (FOLVITE) 1 MG tablet Take 1 tablet (1 mg total) by mouth daily. 08/11/20   Wyvonnia Dusky, MD  hydrOXYzine (VISTARIL) 25 MG capsule Take 1 capsule (25 mg total) by mouth 3 (three) times daily as needed for anxiety. 08/27/20   [name], FNP  levothyroxine (SYNTHROID) 50 MCG tablet Take 1 tablet (50 mcg total) by mouth daily before breakfast. 06/22/20   [name], FNP  LORazepam (ATIVAN) 1 MG tablet Take 3 times a day for 1 day, decrease to twice daily for next 2 days and just take 1 tablet a day preferably at bedtime for next couple of days before stopping it. 10/11/20   Lorella Nimrod, MD  magnesium oxide (MAG-OX) 400 (241.3 Mg) MG tablet Take 1 tablet (400 mg total) by mouth daily. 10/12/20   Lorella Nimrod, MD  ondansetron (ZOFRAN) 4 MG tablet TAKE 1 TABLET(4 MG) BY MOUTH EVERY 8 HOURS AS NEEDED FOR NAUSEA Patient taking differently: Take 4 mg by mouth every 8 (eight) hours as needed for nausea or vomiting. 07/19/20   Evelina Dun A, FNP  pantoprazole (PROTONIX) 40 MG  tablet Take 1 tablet (40 mg total) by mouth daily. 10/12/20   Lorella Nimrod, MD  thiamine 100 MG tablet Take 1 tablet (100 mg total) by mouth daily. 10/12/20   Lorella Nimrod, MD  traMADol (ULTRAM) 50 MG tablet Take 50 mg by mouth every 6 (six) hours as needed for moderate pain.    [provider]  vitamin B-12 (CYANOCOBALAMIN) 1000 MCG tablet Take 1 tablet (1,000 mcg total) by mouth daily. 10/12/20   Lorella Nimrod, MD  Vitamin D, Ergocalciferol, (DRISDOL) 1.25 MG (50000 UNIT) CAPS capsule Take 1 capsule (50,000 Units total) by mouth every 7 (seven) days. 06/22/20   [name], FNP    Allergies    Librium [chlordiazepoxide], Toradol [ketorolac tromethamine], Butalbital-apap-caffeine, Demerol, Esgic [butalbital-apap-caffeine], and Iodine  Review of Systems   Review of Systems  Constitutional: Negative for fever.  HENT: Negative for congestion and sore throat.   Eyes: Negative.   Respiratory: Negative for chest tightness and shortness of breath.   Cardiovascular: Negative for chest pain.  Gastrointestinal: Negative for abdominal pain, nausea and vomiting.  Genitourinary: Negative.   Musculoskeletal: Negative for arthralgias, joint swelling and neck pain.  Skin: Negative.  Negative for rash and wound.  Neurological: Negative for dizziness, tremors, weakness, light-headedness, numbness and headaches.  Psychiatric/Behavioral: Negative for confusion, hallucinations and self-injury. The patient is nervous/anxious.     Physical Exam Updated Vital Signs BP (!) 147/83 (BP Location: Right Arm)   Pulse 84   Temp 98.5 F (36.9 C)   Resp 17   Ht 5\' 1"  (1.549 m)   Wt 49 kg   SpO2 96%   BMI 20.41 kg/m   Physical Exam Vitals and nursing note reviewed.  Constitutional:      Appearance: She is well-developed.  HENT:     Head: Normocephalic and atraumatic.     Mouth/Throat:     Pharynx: Oropharynx is clear.  Eyes:     Conjunctiva/sclera: Conjunctivae normal.  Cardiovascular:      Rate and Rhythm: Normal rate and regular rhythm.     Heart sounds: Normal heart sounds.  Pulmonary:     Effort: Pulmonary effort is normal.     Breath sounds: Normal breath sounds. No wheezing.  Musculoskeletal:        General: Normal range of motion.     Cervical back: Normal range of motion.  Skin:    General: Skin is warm and dry.  Neurological:     General: No focal deficit present.     Mental Status: She is alert and oriented to person, place, and time.  Psychiatric:        Mood and Affect: Mood is anxious.        Speech: Speech normal.        Behavior: Behavior normal. Behavior is cooperative.        Thought Content: Thought content normal.     ED Results / Procedures / Treatments   Labs (all labs ordered are listed, but only abnormal results are displayed) Labs Reviewed  ETHANOL - Abnormal; Notable for the following components:      Result Value   Alcohol, Ethyl (B) 264 (*)    All other components within normal limits  RAPID URINE DRUG SCREEN, HOSP PERFORMED - Abnormal; Notable for the following components:   Barbiturates POSITIVE (*)    All other components within normal limits  CBC WITH DIFFERENTIAL/PLATELET - Abnormal; Notable for the following components:   RBC 3.51 (*)    Hemoglobin 9.7 (*)    HCT 31.2 (*)    RDW 18.3 (*)    All other components within normal limits  COMPREHENSIVE METABOLIC PANEL - Abnormal; Notable for the following components:   Sodium 133 (*)    Potassium 3.3 (*)    CO2 20 (*)    Glucose, Bld 170 (*)    Calcium 8.5 (*)    Total Protein 6.2 (*)    AST 66 (*)    All other components within normal limits  LIPASE, BLOOD    EKG None  Radiology No results found.  Procedures Procedures   Medications Ordered in ED Medications  thiamine tablet 100 mg (100 mg Oral Given 10/26/20 2013)    Or  thiamine (B-1) injection 100 mg ( Intravenous See Alternative 10/26/20 2013)  LORazepam (ATIVAN) injection 0-4 mg ( Intravenous See  Alternative 10/26/20 2014)    Or  LORazepam (ATIVAN) tablet 0-4 mg (2 mg Oral Given 10/26/20 2014)  LORazepam (ATIVAN) tablet 1 mg (1 mg Oral Given 10/26/20 2250)    ED Course  I have reviewed the triage vital signs and the nursing notes.  Pertinent labs & imaging results that were available during my care of the patient were reviewed by me and considered in my medical decision making (see chart for details).    MDM Rules/Calculators/A&P                          Labs reviewed and are stable today. Her hgb is low at 9.7 but similar to prior recent checks, appears chronic.  Her etoh level is 264, unlikely to have dt's or active withdrawal with this level of onboard etoh.  She was given ativan here secondary to elevated CIWA score, but  pt was very anxious, not tachycardic or tremulous, bp elevation appears chronic in comparison to priors, no escalation during observation over 5+ hours here.  No tachycardia and no tremors here.  Pt is not felt to be in active etoh withdrawal. Explained that we can give referrals for outpatient and/or residential tx facilities but she does not need hospitalization for detox.  Librium listed as allergy but she states she never took librium as her daughter researched this medicine and found it can interfere with blood pressure so she threw the prescription away.  Prior chart indicates however that she was taking librium and opiates at the same time causing respiratory depression and need for narcan.  No allergy to librium per se. Discussed with her that this medicine is the medicine she needs if she truly wants to stop drinking, but she CANNOT take this is she is drinking etoh or taking any pain medicine - she understands.  She was told to not drink and not take her tramadol while she is using the librium taper.    She and neighbor understand these instructions. Return precautions outlined. Pt was given resources for outpatient and inpatient etoh abuse programs.   Discussed  with Dr. Roderic Palau prior to dc home. Final Clinical Impression(s) / ED Diagnoses Final diagnoses:  Alcohol dependence with uncomplicated intoxication ([place])    Rx / DC Orders ED Discharge Orders         Ordered    chlordiazePOXIDE (LIBRIUM) 25 MG capsule        10/26/20 2255           Evalee Jefferson, PA-C 10/27/20 2357    Milton Ferguson, MD 10/28/20 1530

## 2020-10-26 NOTE — ED Triage Notes (Signed)
Request help with alcohol abuse, referred from [facility]

## 2020-10-26 NOTE — Discharge Instructions (Addendum)
You have been prescribed librium which is the medicine you need if you are serious about stopping your alcohol use.  This medicine will help with your withdrawal symptoms  It is very important that you DO NOT drink alcohol or while you are taking librium and to make sure you are NOT taking your tramadol while you are on this librium taper.    Please see the resource guide below for locating assistance with your drinking.[name]

## 2020-10-26 NOTE — ED Notes (Signed)
Pt left prior to signing e-signature form, stating she needed to go home now.

## 2020-10-26 NOTE — ED Notes (Signed)
Pt states she was last seen for alcohol withdrawal and tx about 2-3 weeks ago at the [facility]. Is sitting up in the room occasionally yelling out for medication. Pt has been asked multiple times to not scream as there are other patients. Pt states "I want something to make my body stop shaking." Pa notified; CIWA performed. Pt's fiance is sitting with her. Pt is A&Ox3, RR even and nonlabored. Has been updated on POC and expresses understanding.

## 2020-10-26 NOTE — ED Triage Notes (Signed)
States she doesn't know if she can wait.

## 2020-10-26 NOTE — ED Notes (Signed)
Pt reporting back pain, chronic, asks for tramadol and states she normally takes it daily "but somebody took it." States she has been out of her tramadol since she was last at [facility]. Will speak with PA.

## 2020-10-29 DIAGNOSIS — I10 Essential (primary) hypertension: Secondary | ICD-10-CM | POA: Diagnosis not present

## 2020-10-30 ENCOUNTER — Emergency Department (HOSPITAL_COMMUNITY): Payer: Medicare Other

## 2020-10-30 ENCOUNTER — Emergency Department (HOSPITAL_COMMUNITY)
Admission: EM | Admit: 2020-10-30 | Discharge: 2020-10-30 | Disposition: A | Discharge status: Home / Self Care | Payer: Medicare Other | Attending: Emergency Medicine | Admitting: Emergency Medicine

## 2020-10-30 ENCOUNTER — Other Ambulatory Visit: Payer: Self-pay

## 2020-10-30 ENCOUNTER — Encounter (HOSPITAL_COMMUNITY): Payer: Self-pay

## 2020-10-30 DIAGNOSIS — Z7951 Long term (current) use of inhaled steroids: Secondary | ICD-10-CM | POA: Insufficient documentation

## 2020-10-30 DIAGNOSIS — S63124A Dislocation of unspecified interphalangeal joint of right thumb, initial encounter: Secondary | ICD-10-CM | POA: Diagnosis not present

## 2020-10-30 DIAGNOSIS — Z79899 Other long term (current) drug therapy: Secondary | ICD-10-CM | POA: Diagnosis not present

## 2020-10-30 DIAGNOSIS — M7989 Other specified soft tissue disorders: Secondary | ICD-10-CM | POA: Diagnosis not present

## 2020-10-30 DIAGNOSIS — F1010 Alcohol abuse, uncomplicated: Secondary | ICD-10-CM | POA: Diagnosis not present

## 2020-10-30 DIAGNOSIS — E039 Hypothyroidism, unspecified: Secondary | ICD-10-CM | POA: Diagnosis not present

## 2020-10-30 DIAGNOSIS — Z23 Encounter for immunization: Secondary | ICD-10-CM | POA: Diagnosis not present

## 2020-10-30 DIAGNOSIS — J441 Chronic obstructive pulmonary disease with (acute) exacerbation: Secondary | ICD-10-CM | POA: Insufficient documentation

## 2020-10-30 DIAGNOSIS — I10 Essential (primary) hypertension: Secondary | ICD-10-CM | POA: Diagnosis not present

## 2020-10-30 DIAGNOSIS — Z87891 Personal history of nicotine dependence: Secondary | ICD-10-CM | POA: Insufficient documentation

## 2020-10-30 DIAGNOSIS — Y902 Blood alcohol level of 40-59 mg/100 ml: Secondary | ICD-10-CM | POA: Insufficient documentation

## 2020-10-30 DIAGNOSIS — M19041 Primary osteoarthritis, right hand: Secondary | ICD-10-CM | POA: Diagnosis not present

## 2020-10-30 LAB — COMPREHENSIVE METABOLIC PANEL
ALT: 34 U/L (ref 0–44)
AST: 58 U/L — ABNORMAL HIGH (ref 15–41)
Albumin: 3.8 g/dL (ref 3.5–5.0)
Alkaline Phosphatase: 83 U/L (ref 38–126)
Anion gap: 9 (ref 5–15)
BUN: 20 mg/dL (ref 8–23)
CO2: 24 mmol/L (ref 22–32)
Calcium: 8.6 mg/dL — ABNORMAL LOW (ref 8.9–10.3)
Chloride: 103 mmol/L (ref 98–111)
Creatinine, Ser: 0.96 mg/dL (ref 0.44–1.00)
GFR, Estimated: >60 mL/min (ref >60)
Glucose, Bld: 98 mg/dL (ref 70–99)
Potassium: 3.5 mmol/L (ref 3.5–5.1)
Sodium: 136 mmol/L (ref 135–145)
Total Bilirubin: 0.4 mg/dL (ref 0.3–1.2)
Total Protein: 6.3 g/dL — ABNORMAL LOW (ref 6.5–8.1)

## 2020-10-30 LAB — CBC
HCT: 32.2 % — ABNORMAL LOW (ref 36.0–46.0)
Hemoglobin: 10.3 g/dL — ABNORMAL LOW (ref 12.0–15.0)
MCH: 28 pg (ref 26.0–34.0)
MCHC: 32 g/dL (ref 30.0–36.0)
MCV: 87.5 fL (ref 80.0–100.0)
Platelets: 205 10*3/uL (ref 150–400)
RBC: 3.68 MIL/uL — ABNORMAL LOW (ref 3.87–5.11)
RDW: 18.7 % — ABNORMAL HIGH (ref 11.5–15.5)
WBC: 5.2 10*3/uL (ref 4.0–10.5)
nRBC: 0 % (ref 0.0–0.2)

## 2020-10-30 LAB — RAPID URINE DRUG SCREEN, HOSP PERFORMED
Amphetamines: NOT DETECTED
Barbiturates: POSITIVE — AB
Benzodiazepines: POSITIVE — AB
Cocaine: NOT DETECTED
Opiates: NOT DETECTED
Tetrahydrocannabinol: NOT DETECTED

## 2020-10-30 LAB — ETHANOL: Alcohol, Ethyl (B): 52 mg/dL — ABNORMAL HIGH (ref <10)

## 2020-10-30 MED ORDER — LORAZEPAM 1 MG PO TABS
1.0000 mg | ORAL_TABLET | Freq: Once | ORAL | Status: AC
  Administered 2020-10-30: 1 mg via ORAL
  Filled 2020-10-30: qty 1

## 2020-10-30 MED ORDER — TETANUS-DIPHTH-ACELL PERTUSSIS 5-2.5-18.5 LF-MCG/0.5 IM SUSY
0.5000 mL | PREFILLED_SYRINGE | Freq: Once | INTRAMUSCULAR | Status: AC
  Administered 2020-10-30: 0.5 mL via INTRAMUSCULAR
  Filled 2020-10-30: qty 0.5

## 2020-10-30 MED ORDER — LORAZEPAM 1 MG PO TABS: 1.0000 mg | ORAL_TABLET | Freq: Once | ORAL | Status: DC

## 2020-10-30 NOTE — ED Triage Notes (Signed)
Patient states she drinks too much and thinks she is having DTs, states her last drink might have been today, family member states she drinks about a fifth of vodka a day.

## 2020-10-30 NOTE — ED Notes (Signed)
Patient's son states she is addicted to Tramadol and has overdose on ETOH and tramadol in the past.

## 2020-10-30 NOTE — Discharge Instructions (Signed)
1.  You have been given a resource guide to help find treatment for alcohol abuse and dependence.

## 2020-10-30 NOTE — ED Triage Notes (Signed)
Emergency Medicine Provider Triage Evaluation Note  GIRTIE WIERSMA , a 70 y.o. female  was evaluated in triage.  Pt complains of chronic etoh use, wanting to stop drinking. Also notes recent injury to right thumb - pt unsure when it occurred or what happened.   Review of Systems  Positive: etoh abuse/chronic Negative: No cp or sob. No abd pain or vomiting. No fever.   Physical Exam  BP 129/74 (BP Location: Left Arm)   Pulse (!) 101   Temp 98.3 F (36.8 C) (Oral)   Resp 18   Ht 1.549 m (5\' 1" )   Wt 49 kg   SpO2 98%   BMI 20.41 kg/m  Gen:   Awake, no distress   HEENT:  Atraumatic  Resp:  Normal effort  Cardiac:  Normal rate  (hr 94) Abd:   Nondistended, nontender  MSK:   Right thumb with mild swelling and tenderness, abrasion/wound to volar surface  Neuro:  Speech clear. Mildly shaky/tremulous. Oriented.   Medical Decision Making  Medically screening exam initiated at 9:05 PM.  Appropriate orders placed.  LAVAYA DEFREITAS was informed that the remainder of the evaluation will be completed by another provider, this initial triage assessment does not replace that evaluation, and the importance of remaining in the ED until their evaluation is complete.  Clinical Impression  Labs sent. Ativan 1 mg po. Xray ordered.   Will move to treatment room as soon as available.    Lajean Saver, MD 10/30/20 2107

## 2020-10-30 NOTE — ED Provider Notes (Signed)
[facility] EMERGENCY DEPARTMENT Provider Note   CSN: [id number] Arrival date & time: 10/30/20  2008     History Chief Complaint  Patient presents with  . Alcohol Problem    Stacy Dalton is a 70 y.o. female.  HPI Patient reports that she came to the emergency department to get treatment for alcohol withdrawal and to get into a rehab program.  She reports that she wants to stop drinking but has not been able to.  She reports she [facility] to [facility],  She reports that she did not get any Xanax for withdrawal and waited for hours.  Reports she did not actually participate in the treatment program there but ended up being transferred to an emergency department because she got a fever.  She reports she was talking to one of her counselors and they recommended she come to [facility] emergency department to get into a program.  Patient explains that she started drinking as a substitute for pain control for chronic back pain.    Past Medical History:  Diagnosis Date  . Chronic pain   . COPD (chronic obstructive pulmonary disease) ([place])    told has copd, no current inhaler use  . Cough   . Depression   . GERD (gastroesophageal reflux disease)   . Hypertension   . Hypothyroidism   . Insomnia   . Migraine   . Migraine   . Osteopenia   . Pain management   . Panic attacks   . Small bowel obstruction [facility])     Patient Active Problem List   Diagnosis Date Noted  . Alcohol dependence with withdrawal with complication ([place]) 67/20/9470  . Alcohol abuse 06/21/2020  . Multifocal pneumonia 09/19/2018  . Large bowel obstruction ([place])   . Small bowel obstruction ([place]) 07/20/2018  . Hypomagnesemia 07/20/2018  . Cough 03/14/2018  . Fatigue 03/14/2018  . Polypharmacy 02/23/2017  . Hypokalemia 02/23/2017  . Toxic metabolic encephalopathy 96/28/3662  . Anxiety 02/22/2017  . GERD (gastroesophageal reflux disease) 02/22/2017  . Pain medication agreement signed  10/10/2016  . Opioid dependence ([place]) 10/10/2016  . Status post colostomy takedown 04/12/2016  . Chronic constipation 12/03/2015  . Peritonitis with abscess of intestine ([place]) 11/29/2015  . COPD exacerbation ([place]) 09/20/2015  . Osteopenia 06/10/2014  . Vitamin D deficiency 06/10/2014  . Hypothyroidism   . Depression 11/18/2012  . Insomnia 11/18/2012  . Chronic pain syndrome 11/18/2012  . HLD (hyperlipidemia) 09/01/2012  . S/P colostomy ([place]) 04/19/2012  . Normocytic anemia 10/15/2011  . Hypertension 10/13/2011  . Chronic back pain 10/13/2011  . COPD (chronic obstructive pulmonary disease) ([place]) 10/09/2011    Past Surgical History:  Procedure Laterality Date  . ABDOMINAL HYSTERECTOMY    . COLON SURGERY    . COLOSTOMY CLOSURE  04/19/2012   Procedure: COLOSTOMY CLOSURE;  Surgeon: Adin Hector, MD;  Location: [facility] ORS;  Service: General;  Laterality: N/A;  Laparotomy, Resection and Closure of Colostomy  . COLOSTOMY TAKEDOWN N/A 04/12/2016   Procedure: [place] TAKEDOWN;  Surgeon: Johnathan Hausen, MD;  Location: [facility] ORS;  Service: General;  Laterality: N/A;  . INCONTINENCE SURGERY    . LAPAROTOMY  10/04/2011, colostomy also   Procedure: EXPLORATORY LAPAROTOMY;  Surgeon: Adin Hector, MD;  Location: [facility];  Service: General;  Laterality: N/A;  left partial colectomy with colostomy  . LAPAROTOMY  04/19/2012   Procedure: EXPLORATORY LAPAROTOMY;  Surgeon: Adin Hector, MD;  Location: [facility];  Service: General;  Laterality: N/A;  .  LAPAROTOMY N/A 11/29/2015   Procedure: EXPLORATORY LAPAROTOMY; SUBTOTAL COLECTOMY WITH HARTMAN PROCEDURE AND END COLOSTOMY;  Surgeon: Luretha Murphy, MD;  Location: [facility];  Service: General;  Laterality: N/A;  . TUBAL LIGATION    . VENTRAL HERNIA REPAIR  04/19/2012   Procedure: HERNIA REPAIR VENTRAL ADULT;  Surgeon: Ernestene Mention, MD;  Location: [facility];  Service: General;  Laterality: N/A;     OB History    Gravida      Para      Term       Preterm      AB      Living  5     SAB      IAB      Ectopic      Multiple      Live Births              Family History  Problem Relation Age of Onset  . Heart disease Mother   . Hypertension Mother   . Cancer Sister        sinus  . Diabetes Brother   . Heart disease Brother   . Thyroid disease Brother   . Cancer Sister        abdominal ?  . Thyroid disease Sister   . Cancer Other        GE junction adenocarcinoma  . Emphysema Father   . Stroke Father   . Hypertension Father   . Heart disease Father   . COPD Sister   . Thyroid disease Sister   . Thyroid disease Sister   . Diabetes Brother   . Thyroid disease Brother   . Thyroid disease Brother   . Hypertension Brother   . Post-traumatic stress disorder Brother   . COPD Brother   . Thyroid disease Brother   . Thyroid disease Brother   . Hypertension Brother   . Transient ischemic attack Brother     Social History   Tobacco Use  . Smoking status: Former Smoker    Packs/day: 1.50    Years: 20.00    Pack years: 30.00    Types: Cigarettes    Quit date: 10/04/1998    Years since quitting: 22.0  . Smokeless tobacco: Never Used  Vaping Use  . Vaping Use: Never used  Substance Use Topics  . Alcohol use: Yes    Comment: 1bottle vodka -3 days   . Drug use: No    Home Medications Prior to Admission medications   Medication Sig Start Date End Date Taking? Authorizing Provider  amLODipine-olmesartan (AZOR) 10-40 MG tablet Take 1 tablet by mouth daily. 09/27/20  Yes Hawks, Christy A, FNP  Aspirin-Caffeine (BC FAST PAIN RELIEF) 845-65 MG PACK Take 1 packet by mouth daily as needed (pain or headache).   Yes [provider]  benzonatate (TESSALON) 200 MG capsule Take 1 capsule (200 mg total) by mouth 3 (three) times daily as needed. Patient taking differently: Take 200 mg by mouth 3 (three) times daily as needed for cough. 08/30/20  Yes Hawks, Christy A, FNP  DULoxetine (CYMBALTA) 60 MG capsule Take  1 capsule (60 mg total) by mouth daily. 04/27/20  Yes Hawks, Christy A, FNP  fluticasone furoate-vilanterol (BREO ELLIPTA) 200-25 MCG/INH AEPB Inhale 1 puff into the lungs daily. 01/16/20  Yes Hawks, Christy A, FNP  Fluticasone-Salmeterol (ADVAIR) 100-50 MCG/DOSE AEPB Inhale 1 puff into the lungs daily as needed (shortness of breath). 02/09/20  Yes [provider]  folic acid (FOLVITE) 1 MG  tablet Take 1 tablet (1 mg total) by mouth daily. 08/11/20  Yes Terald Sleeper, MD  hydrOXYzine (VISTARIL) 25 MG capsule Take 1 capsule (25 mg total) by mouth 3 (three) times daily as needed for anxiety. 08/27/20  Yes Hawks, Christy A, FNP  levothyroxine (SYNTHROID) 50 MCG tablet Take 1 tablet (50 mcg total) by mouth daily before breakfast. 06/22/20  Yes Hawks, Christy A, FNP  ondansetron (ZOFRAN) 4 MG tablet TAKE 1 TABLET(4 MG) BY MOUTH EVERY 8 HOURS AS NEEDED FOR NAUSEA Patient taking differently: Take 4 mg by mouth every 8 (eight) hours as needed for nausea or vomiting. 07/19/20  Yes Hawks, Christy A, FNP  thiamine 100 MG tablet Take 1 tablet (100 mg total) by mouth daily. 10/12/20  Yes Arnetha Courser, MD  traMADol (ULTRAM) 50 MG tablet Take 50 mg by mouth every 6 (six) hours as needed for moderate pain.   Yes [provider]  vitamin B-12 (CYANOCOBALAMIN) 1000 MCG tablet Take 1 tablet (1,000 mcg total) by mouth daily. 10/12/20  Yes Arnetha Courser, MD  Vitamin D, Ergocalciferol, (DRISDOL) 1.25 MG (50000 UNIT) CAPS capsule Take 1 capsule (50,000 Units total) by mouth every 7 (seven) days. 06/22/20  Yes Hawks, Christy A, FNP  atorvastatin (LIPITOR) 20 MG tablet Take 1 tablet (20 mg total) by mouth daily. Patient not taking: Reported on 10/30/2020 07/18/19 07/17/20  Junie Spencer, FNP  chlordiazePOXIDE (LIBRIUM) 25 MG capsule 50mg  PO TID x 1D, then 25-50mg  PO BID X 1D, then 25-50mg  PO QD X 1D Patient not taking: No sig reported 10/26/20   10/28/20, PA-C  LORazepam (ATIVAN) 1 MG tablet Take 3 times a  day for 1 day, decrease to twice daily for next 2 days and just take 1 tablet a day preferably at bedtime for next couple of days before stopping it. Patient not taking: No sig reported 10/11/20   12/11/20, MD  magnesium oxide (MAG-OX) 400 (241.3 Mg) MG tablet Take 1 tablet (400 mg total) by mouth daily. Patient not taking: No sig reported 10/12/20   12/12/20, MD  pantoprazole (PROTONIX) 40 MG tablet Take 1 tablet (40 mg total) by mouth daily. Patient not taking: No sig reported 10/12/20   12/12/20, MD    Allergies    Librium [chlordiazepoxide], Toradol [ketorolac tromethamine], Butalbital-apap-caffeine, Demerol, Esgic [butalbital-apap-caffeine], and Iodine  Review of Systems   Review of Systems 10 systems reviewed and negative except as per HPI Physical Exam Updated Vital Signs BP 128/74   Pulse 92   Temp 98.3 F (36.8 C) (Oral)   Resp 12   Ht 5\' 1"  (1.549 m)   Wt 49 kg   SpO2 100%   BMI 20.41 kg/m   Physical Exam Constitutional:      Comments: Patient is alert nontoxic.  She is clinically well in appearance.  Objectively she does not appear confused or tremulous.  HENT:     Mouth/Throat:     Pharynx: Oropharynx is clear.  Eyes:     Extraocular Movements: Extraocular movements intact.     Conjunctiva/sclera: Conjunctivae normal.  Cardiovascular:     Rate and Rhythm: Normal rate and regular rhythm.  Pulmonary:     Effort: Pulmonary effort is normal.     Breath sounds: Normal breath sounds.  Abdominal:     General: There is no distension.     Palpations: Abdomen is soft.     Tenderness: There is no abdominal tenderness. There is no guarding.  Musculoskeletal:  General: No swelling or tenderness. Normal range of motion.     Right lower leg: No edema.     Left lower leg: No edema.  Skin:    General: Skin is warm and dry.  Neurological:     General: No focal deficit present.     Mental Status: She is oriented to person, place, and time.     Cranial  Nerves: No cranial nerve deficit.     Motor: No weakness.     Coordination: Coordination normal.  Psychiatric:        Mood and Affect: Mood normal.     ED Results / Procedures / Treatments   Labs (all labs ordered are listed, but only abnormal results are displayed) Labs Reviewed  COMPREHENSIVE METABOLIC PANEL - Abnormal; Notable for the following components:      Result Value   Calcium 8.6 (*)    Total Protein 6.3 (*)    AST 58 (*)    All other components within normal limits  ETHANOL - Abnormal; Notable for the following components:   Alcohol, Ethyl (B) 52 (*)    All other components within normal limits  CBC - Abnormal; Notable for the following components:   RBC 3.68 (*)    Hemoglobin 10.3 (*)    HCT 32.2 (*)    RDW 18.7 (*)    All other components within normal limits  RAPID URINE DRUG SCREEN, HOSP PERFORMED - Abnormal; Notable for the following components:   Benzodiazepines POSITIVE (*)    Barbiturates POSITIVE (*)    All other components within normal limits    EKG None  Radiology DG Finger Thumb Right  Result Date: 10/30/2020 CLINICAL DATA:  Right thumb pain. EXAM: RIGHT THUMB 2+V COMPARISON:  None. FINDINGS: There is dorsal dislocation of the distal phalanx of the right thumb. No acute fracture is identified. Marked severity degenerative changes are seen along the carpometacarpal articulation of the right thumb. Diffuse soft tissue swelling is noted. IMPRESSION: Interphalangeal dislocation of the right thumb. Electronically Signed   By: Virgina Norfolk M.D.   On: 10/30/2020 21:47    Procedures Procedures   Medications Ordered in ED Medications  Tdap (BOOSTRIX) injection 0.5 mL (0.5 mLs Intramuscular Given 10/30/20 2311)  LORazepam (ATIVAN) tablet 1 mg (1 mg Oral Given 10/30/20 2310)    ED Course  I have reviewed the triage vital signs and the nursing notes.  Pertinent labs & imaging results that were available during my care of the patient were reviewed  by me and considered in my medical decision making (see chart for details).    MDM Rules/Calculators/A&P                          Patient has history of alcohol dependence.  Also there is history of prior opioid dependence.  Patient describes having substituted alcohol for pain medications for chronic back pain.  She reports that now she wants to try to stop drinking.  She reports she has not been successful to date in quitting drinking.  Review of EMR indicates patient's had a number of presentations related to alcoholism.  Currently patient does not show any signs of active withdrawal.  She does not have hypertension or tachycardia.  mental status is clear.  No active tremor.  Patient does report port subjectively feeling very anxious and like she will go into withdrawal.  She reports she has been given Librium before and she feels like that was  worse than the alcohol.  At this time, patient is not showing signs of active withdrawal and does not need admission to the hospital for alcohol withdrawal.  Patient has tried [facility] and was not satisfied with her care.  At this time, I will provide the resource guide and suggest that the patient follow-up with her primary care provider and use a resource guide to find a treatment program. Final Clinical Impression(s) / ED Diagnoses Final diagnoses:  Alcohol abuse    Rx / DC Orders ED Discharge Orders    None       Charlesetta Shanks, MD 10/30/20 2336

## 2020-11-04 ENCOUNTER — Ambulatory Visit (INDEPENDENT_AMBULATORY_CARE_PROVIDER_SITE_OTHER): Payer: Medicare Other | Admitting: Licensed Clinical Social Worker

## 2020-11-04 DIAGNOSIS — J441 Chronic obstructive pulmonary disease with (acute) exacerbation: Secondary | ICD-10-CM

## 2020-11-04 DIAGNOSIS — E785 Hyperlipidemia, unspecified: Secondary | ICD-10-CM | POA: Diagnosis not present

## 2020-11-04 DIAGNOSIS — E039 Hypothyroidism, unspecified: Secondary | ICD-10-CM

## 2020-11-04 DIAGNOSIS — F321 Major depressive disorder, single episode, moderate: Secondary | ICD-10-CM

## 2020-11-04 DIAGNOSIS — G4700 Insomnia, unspecified: Secondary | ICD-10-CM

## 2020-11-04 DIAGNOSIS — F419 Anxiety disorder, unspecified: Secondary | ICD-10-CM

## 2020-11-04 DIAGNOSIS — I10 Essential (primary) hypertension: Secondary | ICD-10-CM | POA: Diagnosis not present

## 2020-11-04 DIAGNOSIS — K219 Gastro-esophageal reflux disease without esophagitis: Secondary | ICD-10-CM

## 2020-11-04 NOTE — Chronic Care Management (AMB) (Signed)
Chronic Care Management    Clinical Social Work Note  11/04/2020 Name: Stacy Dalton MRN: [id number] DOB: 1950-07-30  Stacy Dalton is a 70 y.o. year old female who is a primary care patient of [name], FNP. The CCM team was consulted to assist the patient with chronic disease management and/or care coordination needs related to: [name] .   Engaged with patient / son, Stacy Dalton, by telephone for follow up visit in response to provider referral for social work chronic care management and care coordination services.   Consent to Services:  The patient was given information about Chronic Care Management services, agreed to services, and gave verbal consent prior to initiation of services.  Please see initial visit note for detailed documentation.   Patient agreed to services and consent obtained.   Assessment: Review of patient past medical history, allergies, medications, and health status, including review of relevant consultants reports was performed today as part of a comprehensive evaluation and provision of chronic care management and care coordination services.     SDOH (Social Determinants of Health) assessments and interventions performed:  SDOH Interventions   Flowsheet Row Most Recent Value  SDOH Interventions   Depression Interventions/Treatment  Medication       Advanced Directives Status: See Vynca application for related entries.  CCM Care Plan  Allergies  Allergen Reactions  . Librium [Chlordiazepoxide]     Became unresponsive  . Toradol [Ketorolac Tromethamine] Shortness Of Breath  . Butalbital-Apap-Caffeine Other (See Comments)    jittery  . Demerol Swelling  . Esgic [Butalbital-Apap-Caffeine] Other (See Comments)    jittery  . Iodine Other (See Comments)    bp bottomed out per pt several hours later; unsure if pre medicated in past with cm; done in [place]    Outpatient Encounter Medications as of 11/04/2020  Medication Sig  .  amLODipine-olmesartan (AZOR) 10-40 MG tablet Take 1 tablet by mouth daily.  . Aspirin-Caffeine (BC FAST PAIN RELIEF) 845-65 MG PACK Take 1 packet by mouth daily as needed (pain or headache).  [name] atorvastatin (LIPITOR) 20 MG tablet Take 1 tablet (20 mg total) by mouth daily. (Patient not taking: Reported on 10/30/2020)  . benzonatate (TESSALON) 200 MG capsule Take 1 capsule (200 mg total) by mouth 3 (three) times daily as needed. (Patient taking differently: Take 200 mg by mouth 3 (three) times daily as needed for cough.)  . chlordiazePOXIDE (LIBRIUM) 25 MG capsule 50mg  PO TID x 1D, then 25-50mg  PO BID X 1D, then 25-50mg  PO QD X 1D (Patient not taking: No sig reported)  . DULoxetine (CYMBALTA) 60 MG capsule Take 1 capsule (60 mg total) by mouth daily.  . fluticasone furoate-vilanterol (BREO ELLIPTA) 200-25 MCG/INH AEPB Inhale 1 puff into the lungs daily.  . Fluticasone-Salmeterol (ADVAIR) 100-50 MCG/DOSE AEPB Inhale 1 puff into the lungs daily as needed (shortness of breath).  . folic acid (FOLVITE) 1 MG tablet Take 1 tablet (1 mg total) by mouth daily.  . hydrOXYzine (VISTARIL) 25 MG capsule Take 1 capsule (25 mg total) by mouth 3 (three) times daily as needed for anxiety.  [name] levothyroxine (SYNTHROID) 50 MCG tablet Take 1 tablet (50 mcg total) by mouth daily before breakfast.  . LORazepam (ATIVAN) 1 MG tablet Take 3 times a day for 1 day, decrease to twice daily for next 2 days and just take 1 tablet a day preferably at bedtime for next couple of days before stopping it. (Patient not taking: No sig reported)  .  magnesium oxide (MAG-OX) 400 (241.3 Mg) MG tablet Take 1 tablet (400 mg total) by mouth daily. (Patient not taking: No sig reported)  . ondansetron (ZOFRAN) 4 MG tablet TAKE 1 TABLET(4 MG) BY MOUTH EVERY 8 HOURS AS NEEDED FOR NAUSEA (Patient taking differently: Take 4 mg by mouth every 8 (eight) hours as needed for nausea or vomiting.)  . pantoprazole (PROTONIX) 40 MG tablet Take 1 tablet (40 mg  total) by mouth daily. (Patient not taking: No sig reported)  . thiamine 100 MG tablet Take 1 tablet (100 mg total) by mouth daily.  . traMADol (ULTRAM) 50 MG tablet Take 50 mg by mouth every 6 (six) hours as needed for moderate pain.  . vitamin B-12 (CYANOCOBALAMIN) 1000 MCG tablet Take 1 tablet (1,000 mcg total) by mouth daily.  . Vitamin D, Ergocalciferol, (DRISDOL) 1.25 MG (50000 UNIT) CAPS capsule Take 1 capsule (50,000 Units total) by mouth every 7 (seven) days.   No facility-administered encounter medications on file as of 11/04/2020.    Patient Active Problem List   Diagnosis Date Noted  . Alcohol dependence with withdrawal with complication ([place]) 91/69/4503  . Alcohol abuse 06/21/2020  . Multifocal pneumonia 09/19/2018  . Large bowel obstruction ([place])   . Small bowel obstruction ([place]) 07/20/2018  . Hypomagnesemia 07/20/2018  . Cough 03/14/2018  . Fatigue 03/14/2018  . Polypharmacy 02/23/2017  . Hypokalemia 02/23/2017  . Toxic metabolic encephalopathy 88/82/8003  . Anxiety 02/22/2017  . GERD (gastroesophageal reflux disease) 02/22/2017  . Pain medication agreement signed 10/10/2016  . Opioid dependence ([place]) 10/10/2016  . Status post colostomy takedown 04/12/2016  . Chronic constipation 12/03/2015  . Peritonitis with abscess of intestine ([place]) 11/29/2015  . COPD exacerbation ([place]) 09/20/2015  . Osteopenia 06/10/2014  . Vitamin D deficiency 06/10/2014  . Hypothyroidism   . Depression 11/18/2012  . Insomnia 11/18/2012  . Chronic pain syndrome 11/18/2012  . HLD (hyperlipidemia) 09/01/2012  . S/P colostomy ([place]) 04/19/2012  . Normocytic anemia 10/15/2011  . Hypertension 10/13/2011  . Chronic back pain 10/13/2011  . COPD (chronic obstructive pulmonary disease) ([place]) 10/09/2011    Conditions to be addressed/monitored: Monitor client use of alcohol and completing of daily ADLs for client   Care Plan : LCSW Care Plan  Updates made by Katha Cabal, LCSW since  11/04/2020 12:00 AM    Problem: Emotional Distress     Goal: Emotional Health Supported   Start Date: 09/30/2020  Expected End Date: 12/30/2020  This Visit's Progress: Not on track  Recent Progress: Not on track  Priority: High  Note:   Current Barriers:  . Chronic Mental Health needs related to management of anxiety and stress issues . Suicidal Ideation/Homicidal Ideation: No . Hx of Alcohol Use/Abuse  Clinical Social Work Goal(s):  . patient will work with SW monthly by telephone or in person to reduce or manage symptoms related to anxiety and stress issues faced . Client will call LCSW in next 30 days as needed to discuss LCSW support with CCM program  Interventions: . 1:1 collaboration with  [name], FNP regarding development and update of comprehensive plan of care as evidenced by provider attestation and co-signature . Talked with Stacy Dalton, son of client,about client needs . Talked with Stacy Dalton about client use of alcohol . Talked with Stacy Dalton about client use of pain medications in the past . Talked with Stacy Dalton about [place] and about Al-Anon resources . Talked with Stacy Dalton about sleeping issues of client . Talked with Stacy Dalton about anxiety  and stress issues of client . Talked with Stacy Dalton about mobility of client . Talked with Stacy Dalton about appetite  of client  . Talked with Stacy Dalton about her upcoming medical appointments . Talked with Stacy Dalton about mood of client (depression, anxiety) . Talked with Stacy Dalton about family support . Talked with Stacy Dalton about Alcohol Treatment programs in area for client  Patient Self Care Activities:  . Takes medications as prescribed  Patient Coping Strengths:  . Has family support from her son, Stacy Dalton  Patient Self Care Deficits:  . Hx Alcohol Use/Abuse  Patient Goals:  - spend time or talk with others every day - practice relaxation or meditation daily - keep a calendar with appointment dates  Follow Up Plan: LCSW to call client on 12/14/20 to assess  needs of client     Norva Riffle.Kyndell Zeiser MSW, LCSW Licensed Clinical Social Worker [facility] Care Management [phone]

## 2020-11-04 NOTE — Patient Instructions (Signed)
Visit Information  PATIENT GOALS: Goals Addressed            This Visit's Progress   . Manage Anxiety and Stress issues faced       Timeframe:  Short-Term Goal Priority:  High Progress: Not On Track Start Date:      09/30/20                       Expected End Date:       12/30/20                Follow Up Date 12/14/20   Manage My Emotions (Patient) Manage Anxiety and Stress Symptoms Faced    Why is this important?    When you are stressed, down or upset, your body reacts too.   For example, your blood pressure may get higher; you may have a headache or stomachache.   When your emotions get the best of you, your body's ability to fight off cold and flu gets weak.   These steps will help you manage your emotions.     Patient Self Care Activities:  . Takes medications as prescribed  Patient Coping Strengths:  . Has some family support from her son, [name]  Patient Self Care Deficits:  . Hx Alcohol Use/Abuse  Patient Goals:  - spend time or talk with others every day - practice relaxation or meditation daily - keep a calendar with appointment dates  Follow Up Plan: LCSW to call client on 12/14/20 to assess needs of client       Norva Riffle.Shericka Johnstone MSW, LCSW Licensed Clinical Social Worker [facility] Care Management [phone]

## 2020-11-05 ENCOUNTER — Other Ambulatory Visit: Payer: Self-pay

## 2020-11-05 ENCOUNTER — Ambulatory Visit (INDEPENDENT_AMBULATORY_CARE_PROVIDER_SITE_OTHER): Payer: Medicare Other | Admitting: Family

## 2020-11-05 ENCOUNTER — Encounter: Payer: Self-pay | Admitting: Family

## 2020-11-05 ENCOUNTER — Ambulatory Visit (INDEPENDENT_AMBULATORY_CARE_PROVIDER_SITE_OTHER): Payer: Medicare Other

## 2020-11-05 VITALS — BP 122/71 | HR 90 | Temp 98.1°F | Ht 61.0 in | Wt 122.2 lb

## 2020-11-05 DIAGNOSIS — R300 Dysuria: Secondary | ICD-10-CM

## 2020-11-05 DIAGNOSIS — R399 Unspecified symptoms and signs involving the genitourinary system: Secondary | ICD-10-CM

## 2020-11-05 DIAGNOSIS — S0093XD Contusion of unspecified part of head, subsequent encounter: Secondary | ICD-10-CM

## 2020-11-05 DIAGNOSIS — S63105D Unspecified dislocation of left thumb, subsequent encounter: Secondary | ICD-10-CM | POA: Diagnosis not present

## 2020-11-05 DIAGNOSIS — S61011D Laceration without foreign body of right thumb without damage to nail, subsequent encounter: Secondary | ICD-10-CM

## 2020-11-05 DIAGNOSIS — S63124D Dislocation of unspecified interphalangeal joint of right thumb, subsequent encounter: Secondary | ICD-10-CM | POA: Diagnosis not present

## 2020-11-05 DIAGNOSIS — F1010 Alcohol abuse, uncomplicated: Secondary | ICD-10-CM

## 2020-11-05 LAB — URINALYSIS, COMPLETE
Bilirubin, UA: NEGATIVE
Glucose, UA: NEGATIVE
Ketones, UA: NEGATIVE
Nitrite, UA: NEGATIVE
Protein,UA: NEGATIVE
Specific Gravity, UA: 1.015 (ref 1.005–1.030)
Urobilinogen, Ur: 0.2 mg/dL (ref 0.2–1.0)
pH, UA: 7 (ref 5.0–7.5)

## 2020-11-05 LAB — MICROSCOPIC EXAMINATION: RBC, Urine: NONE SEEN /hpf (ref 0–2)

## 2020-11-05 MED ORDER — AMOXICILLIN-POT CLAVULANATE 875-125 MG PO TABS
1.0000 | ORAL_TABLET | Freq: Two times a day (BID) | ORAL | 0 refills | Status: DC
Start: 2020-11-05 — End: 2020-11-21

## 2020-11-05 NOTE — Patient Instructions (Signed)
Finger or Thumb Dislocation Finger or thumb dislocation happens when the bones in a joint move out of their normal positions. Dislocations may occur in any joint in the fingers or thumb. Finger or thumb dislocation is a common and serious injury. What are the causes? This condition is caused by a forceful impact or injury to the hand or when the finger or thumb bends the wrong way (hyperextension). What increases the risk? You are more likely to develop this condition if you:  Have previously injured your hand.  Do repetitive motions with your hands, such as when playing sports or doing heavy labor.  Have poor hand strength and flexibility. What are the signs or symptoms? Symptoms of this condition may include:  Deformity of the injured area. The affected joint may look like it is out of place or at an odd angle.  Pain, swelling, and bruising in the injured area.  Limited range of motion of the finger or thumb. How is this diagnosed? This condition is diagnosed with a physical exam. You may also have X-rays to check for breaks (fractures) in your bones. How is this treated? For mild dislocations, this condition is treated by moving your finger or thumb back into position (reduction). Your health care provider may do this by hand (manually) or with surgery. You may need surgical reduction if you have:  A severe dislocation.  A dislocation that cannot be reduced manually.  A fractured bone.  An open wound. After reduction, your finger or thumb may be kept in a fixed position (immobilized) with a splint for up to 6 weeks. You may also need physical therapy. In some cases, you may need to go to a health care provider who specializes in bone disorders (orthopedist) to help treat your condition. Follow these instructions at home: If you have a splint:  Do not put pressure on any part of the splint until it is fully hardened. This may take several hours.  Wear the splint as told by  your health care provider. Remove it only as told by your health care provider.  Loosen the splint if your fingers tingle, become numb, or turn cold and blue.  Keep the splint clean.  If the splint is not waterproof: ? Do not let it get wet. ? Cover it with a watertight covering when you take a bath or shower. Managing pain, stiffness, and swelling  If directed, put ice on the injured area. ? If you have a removable splint, remove it as told by your health care provider. ? Put ice in a plastic bag. ? Place a towel between your skin and the bag. ? Leave the ice on for 20 minutes, 2-3 times a day.  Move your fingers often to reduce stiffness and swelling.  Raise (elevate) the injured area above the level of your heart while you are sitting or lying down.   Activity  Return to your normal activities as told by your health care provider. Ask your health care provider what activities are safe for you.  Rest and limit your hand movement as told by your health care provider.  If physical therapy was prescribed, do exercises as told by your health care provider. Driving  Ask your health care provider if the medicine prescribed to you requires you to avoid driving or using heavy machinery.  Ask your health care provider when it is safe to drive if you have a splint on your hand. General instructions  Take over-the-counter and prescription medicines  only as told by your health care provider.  Do not take baths, swim, or use a hot tub until your health care provider approves. Ask your health care provider if you may take showers. You may only be allowed to take sponge baths.  Do not use any products that contain nicotine or tobacco, such as cigarettes, e-cigarettes, and chewing tobacco. These can delay bone healing. If you need help quitting, ask your health care provider.  Keep all follow-up visits as told by your health care provider. This is important. Contact a health care provider  if you have:  Problems with your splint.  Pain that gets worse or does not get better with medicine.  More bruising, swelling, or redness in your injured area.  Difficulty moving your finger or thumb after it heals. Get help right away if:  You develop numbness in your finger or thumb.  You cannot move your finger or thumb.  Your finger or thumb is pale or cold.  You have severe pain. Summary  Finger or thumb dislocation happens when the bones in a joint move out of their normal positions.  This condition is caused by a forceful impact or injury to the hand.  For mild dislocations, this condition is treated by moving your finger or thumb back into position (reduction).  You may need surgery if you have a severe dislocation, an open wound, or a fractured bone. This information is not intended to replace advice given to you by your health care provider. Make sure you discuss any questions you have with your health care provider. Document Revised: 06/12/2018 Document Reviewed: 06/12/2018 Elsevier Patient Education  2021 [employer].

## 2020-11-05 NOTE — Progress Notes (Signed)
Subjective:    Patient ID: Stacy Dalton, female    DOB: March 09, 1951, 71 y.o.   MRN: [id number]  Chief Complaint  Patient presents with  . Dysuria  . Laceration    ON FINGER   . Head Injury    OVER A MONTH AGO AND ITS STILL SORE AND RAISED    Pt presents to the office today UTI symptoms. She reports she fell a month ago and cut it. She reports her pain is a 10 out 10 and can move it. She had x-ray on 10/30/20 that showed interphalangeal dislocation of right thumb. She is unable to bend her thumb.   She reports she fell another time while drinking and hit the back of her head. She states she continues to have a lump on the back of the head. Dysuria  This is a new problem. The current episode started 1 to 4 weeks ago. The problem occurs intermittently. The problem has been waxing and waning. The quality of the pain is described as burning. The pain is at a severity of 3/10. The pain is mild. Associated symptoms include frequency, hematuria, nausea and urgency. She has tried increased fluids for the symptoms. The treatment provided mild relief.  Laceration   Head Injury       Review of Systems  Gastrointestinal: Positive for nausea.  Genitourinary: Positive for dysuria, frequency, hematuria and urgency.  All other systems reviewed and are negative.      Objective:   Physical Exam Vitals reviewed.  Constitutional:      General: She is not in acute distress.    Appearance: She is well-developed.  HENT:     Head: Normocephalic and atraumatic.     Right Ear: Tympanic membrane normal.     Left Ear: Tympanic membrane normal.  Eyes:     Pupils: Pupils are equal, round, and reactive to light.  Neck:     Thyroid: No thyromegaly.  Cardiovascular:     Rate and Rhythm: Normal rate and regular rhythm.     Heart sounds: Normal heart sounds. No murmur heard.   Pulmonary:     Effort: Pulmonary effort is normal. No respiratory distress.     Breath sounds: Normal breath sounds.  No wheezing.  Abdominal:     General: Bowel sounds are normal. There is no distension.     Palpations: Abdomen is soft.     Tenderness: There is no abdominal tenderness.  Musculoskeletal:        General: Swelling, tenderness and deformity present.     Cervical back: Normal range of motion and neck supple.     Comments: Right thumb 3+ edema, erythemas, and tender, scabbed laceration.   Skin:    General: Skin is warm and dry.  Neurological:     Mental Status: She is alert and oriented to person, place, and time.     Cranial Nerves: No cranial nerve deficit.     Deep Tendon Reflexes: Reflexes are normal and symmetric.  Psychiatric:        Behavior: Behavior normal.        Thought Content: Thought content normal.        Judgment: Judgment normal.       BP 122/71   Pulse 90   Temp 98.1 F (36.7 C) (Temporal)   Ht 5\' 1"  (1.549 m)   Wt 122 lb 3.2 oz (55.4 kg)   BMI 23.09 kg/m      Assessment & Plan:  Stacy Dalton  comes in today with chief complaint of Dysuria, Laceration (ON FINGER ), and Head Injury (OVER A MONTH AGO AND ITS STILL SORE AND RAISED )   Diagnosis and orders addressed:  1. Dysuria - Urine Culture - Urinalysis, Complete  2. Laceration of right thumb without foreign body without damage to nail, subsequent encounter - DG Finger Thumb Right; Future - Ambulatory referral to Orthopedic Surgery - amoxicillin-clavulanate (AUGMENTIN) 875-125 MG tablet; Take 1 tablet by mouth 2 (two) times daily.  Dispense: 14 tablet; Refill: 0  3. Dislocation of left thumb, subsequent encounter - DG Finger Thumb Right; Future - Ambulatory referral to Orthopedic Surgery  4. UTI symptoms - amoxicillin-clavulanate (AUGMENTIN) 875-125 MG tablet; Take 1 tablet by mouth 2 (two) times daily.  Dispense: 14 tablet; Refill: 0  5. Contusion of head, unspecified part of head, subsequent encounter  6. Alcohol abuse   Start Augmentin  Referral to Ortho placed Continue to avoid  Vodka and slowing decrease alcohol intake Thumb splinted today Negative x-ray for infection of bone.   Evelina Dun, FNP

## 2020-11-08 ENCOUNTER — Telehealth: Payer: Self-pay

## 2020-11-08 MED ORDER — CEPHALEXIN 500 MG PO CAPS: 500.0000 mg | ORAL_CAPSULE | Freq: Two times a day (BID) | ORAL | 0 refills | Status: DC

## 2020-11-08 NOTE — Telephone Encounter (Signed)
Patient aware and verbalized understanding. °

## 2020-11-08 NOTE — Telephone Encounter (Signed)
Pt called stating that the antibiotic she was recently prescribed is not working for her and is making her sick to her stomach. Wants to know if a different antibiotic can be sent in for her.  Please advise and call patient.  [phone]

## 2020-11-08 NOTE — Telephone Encounter (Signed)
Stop Augmentin and start Keflex.

## 2020-11-12 LAB — URINE CULTURE

## 2020-11-20 ENCOUNTER — Other Ambulatory Visit: Payer: Self-pay

## 2020-11-20 ENCOUNTER — Emergency Department (HOSPITAL_COMMUNITY)
Admission: EM | Admit: 2020-11-20 | Discharge: 2020-11-21 | Disposition: A | Discharge status: Home / Self Care | Payer: Medicare Other | Attending: Emergency Medicine | Admitting: Emergency Medicine

## 2020-11-20 DIAGNOSIS — R45851 Suicidal ideations: Secondary | ICD-10-CM | POA: Diagnosis not present

## 2020-11-20 DIAGNOSIS — F10929 Alcohol use, unspecified with intoxication, unspecified: Secondary | ICD-10-CM

## 2020-11-20 DIAGNOSIS — M79644 Pain in right finger(s): Secondary | ICD-10-CM | POA: Diagnosis not present

## 2020-11-20 DIAGNOSIS — E876 Hypokalemia: Secondary | ICD-10-CM

## 2020-11-20 DIAGNOSIS — I10 Essential (primary) hypertension: Secondary | ICD-10-CM | POA: Diagnosis not present

## 2020-11-20 DIAGNOSIS — Z79899 Other long term (current) drug therapy: Secondary | ICD-10-CM | POA: Diagnosis not present

## 2020-11-20 DIAGNOSIS — E039 Hypothyroidism, unspecified: Secondary | ICD-10-CM | POA: Insufficient documentation

## 2020-11-20 DIAGNOSIS — M545 Low back pain, unspecified: Secondary | ICD-10-CM | POA: Insufficient documentation

## 2020-11-20 DIAGNOSIS — Z7982 Long term (current) use of aspirin: Secondary | ICD-10-CM | POA: Diagnosis not present

## 2020-11-20 DIAGNOSIS — Z87891 Personal history of nicotine dependence: Secondary | ICD-10-CM | POA: Insufficient documentation

## 2020-11-20 DIAGNOSIS — F10129 Alcohol abuse with intoxication, unspecified: Secondary | ICD-10-CM | POA: Insufficient documentation

## 2020-11-20 DIAGNOSIS — G8929 Other chronic pain: Secondary | ICD-10-CM | POA: Insufficient documentation

## 2020-11-20 DIAGNOSIS — Y908 Blood alcohol level of 240 mg/100 ml or more: Secondary | ICD-10-CM | POA: Diagnosis not present

## 2020-11-20 DIAGNOSIS — J441 Chronic obstructive pulmonary disease with (acute) exacerbation: Secondary | ICD-10-CM | POA: Diagnosis not present

## 2020-11-20 DIAGNOSIS — F1020 Alcohol dependence, uncomplicated: Secondary | ICD-10-CM

## 2020-11-20 LAB — RAPID URINE DRUG SCREEN, HOSP PERFORMED
Amphetamines: NOT DETECTED
Barbiturates: NOT DETECTED
Benzodiazepines: NOT DETECTED
Cocaine: NOT DETECTED
Opiates: NOT DETECTED
Tetrahydrocannabinol: NOT DETECTED

## 2020-11-20 MED ORDER — HALOPERIDOL LACTATE 5 MG/ML IJ SOLN
5.0000 mg | Freq: Once | INTRAMUSCULAR | Status: AC
  Administered 2020-11-20: 5 mg via INTRAMUSCULAR
  Filled 2020-11-20: qty 1

## 2020-11-20 NOTE — ED Notes (Signed)
Pt states "I want to die, just let me be, I just want everything to end."  Per family, pt tried to pull out in front of a car.

## 2020-11-20 NOTE — ED Triage Notes (Signed)
Chronic mid thoracic back pain and right thumb pain. Pt denies any SI/HI. Also reports of vomiting. Denies any recent ETOH.

## 2020-11-20 NOTE — ED Notes (Signed)
Pt not redirectable, refusing to stay in recliner in triage. Pt wailing and shouting.

## 2020-11-20 NOTE — ED Provider Notes (Signed)
MSE was initiated and I personally evaluated the patient and placed orders (if any) at  10:45 PM on Nov 20, 2020.  Patient here with chronic back pain, right thumb pain, and vomiting.    Complains of SI.  Per family she tried to pull out in front of another car.  States, "I want to die, just let me be, I want everything to end."   ROS: As listed above  PE: Alert and oriented Answers questions appropriately No respiratory distress Ambulatory  Discussed with patient that their care has been initiated.   They are counseled that they will need to remain in the ED until the completion of their workup, including full H&P and results of any tests.  Risks of leaving the emergency department prior to completion of treatment were discussed. Patient was advised to inform ED staff if they are leaving before their treatment is complete. The patient acknowledged these risks and time was allowed for questions.    The patient appears stable so that the remainder of the MSE may be completed by another provider.    Montine Circle, PA-C 11/20/20 2247    Gareth Morgan, MD 11/23/20 1324

## 2020-11-21 DIAGNOSIS — M545 Low back pain, unspecified: Secondary | ICD-10-CM | POA: Diagnosis not present

## 2020-11-21 LAB — BASIC METABOLIC PANEL
Anion gap: 11 (ref 5–15)
BUN: 16 mg/dL (ref 8–23)
CO2: 25 mmol/L (ref 22–32)
Calcium: 8.2 mg/dL — ABNORMAL LOW (ref 8.9–10.3)
Chloride: 104 mmol/L (ref 98–111)
Creatinine, Ser: 0.84 mg/dL (ref 0.44–1.00)
GFR, Estimated: >60 mL/min (ref >60)
Glucose, Bld: 116 mg/dL — ABNORMAL HIGH (ref 70–99)
Potassium: 3.3 mmol/L — ABNORMAL LOW (ref 3.5–5.1)
Sodium: 140 mmol/L (ref 135–145)

## 2020-11-21 LAB — COMPREHENSIVE METABOLIC PANEL
ALT: 40 U/L (ref 0–44)
AST: 86 U/L — ABNORMAL HIGH (ref 15–41)
Albumin: 3.9 g/dL (ref 3.5–5.0)
Alkaline Phosphatase: 85 U/L (ref 38–126)
Anion gap: 13 (ref 5–15)
BUN: 17 mg/dL (ref 8–23)
CO2: 26 mmol/L (ref 22–32)
Calcium: 8.7 mg/dL — ABNORMAL LOW (ref 8.9–10.3)
Chloride: 103 mmol/L (ref 98–111)
Creatinine, Ser: 1 mg/dL (ref 0.44–1.00)
GFR, Estimated: 60 mL/min — ABNORMAL LOW (ref >60)
Glucose, Bld: 123 mg/dL — ABNORMAL HIGH (ref 70–99)
Potassium: 2.7 mmol/L — CL (ref 3.5–5.1)
Sodium: 142 mmol/L (ref 135–145)
Total Bilirubin: 0.5 mg/dL (ref 0.3–1.2)
Total Protein: 6.6 g/dL (ref 6.5–8.1)

## 2020-11-21 LAB — CBC
HCT: 37.9 % (ref 36.0–46.0)
Hemoglobin: 12.5 g/dL (ref 12.0–15.0)
MCH: 27 pg (ref 26.0–34.0)
MCHC: 33 g/dL (ref 30.0–36.0)
MCV: 81.9 fL (ref 80.0–100.0)
Platelets: 443 10*3/uL — ABNORMAL HIGH (ref 150–400)
RBC: 4.63 MIL/uL (ref 3.87–5.11)
RDW: 17.2 % — ABNORMAL HIGH (ref 11.5–15.5)
WBC: 8.3 10*3/uL (ref 4.0–10.5)
nRBC: 0 % (ref 0.0–0.2)

## 2020-11-21 LAB — ACETAMINOPHEN LEVEL: Acetaminophen (Tylenol), Serum: <10 ug/mL — ABNORMAL LOW (ref 10–30)

## 2020-11-21 LAB — SALICYLATE LEVEL: Salicylate Lvl: <7 mg/dL — ABNORMAL LOW (ref 7.0–30.0)

## 2020-11-21 LAB — ETHANOL: Alcohol, Ethyl (B): 367 mg/dL (ref <10)

## 2020-11-21 MED ORDER — LEVOTHYROXINE SODIUM 25 MCG PO TABS
50.0000 ug | ORAL_TABLET | Freq: Every day | ORAL | Status: DC
  Administered 2020-11-21: 50 ug via ORAL
  Filled 2020-11-21: qty 2

## 2020-11-21 MED ORDER — THIAMINE HCL 100 MG PO TABS
100.0000 mg | ORAL_TABLET | Freq: Every day | ORAL | Status: DC
  Administered 2020-11-21: 100 mg via ORAL

## 2020-11-21 MED ORDER — AMLODIPINE BESYLATE 5 MG PO TABS
10.0000 mg | ORAL_TABLET | Freq: Every day | ORAL | Status: DC
  Administered 2020-11-21: 10 mg via ORAL
  Filled 2020-11-21: qty 2

## 2020-11-21 MED ORDER — LORAZEPAM 2 MG/ML IJ SOLN: 0.0000 mg | Freq: Four times a day (QID) | INTRAMUSCULAR | Status: DC

## 2020-11-21 MED ORDER — DULOXETINE HCL 60 MG PO CPEP
60.0000 mg | ORAL_CAPSULE | Freq: Every day | ORAL | Status: DC
  Administered 2020-11-21: 60 mg via ORAL
  Filled 2020-11-21: qty 1

## 2020-11-21 MED ORDER — VITAMIN B-12 1000 MCG PO TABS
1000.0000 ug | ORAL_TABLET | Freq: Every day | ORAL | Status: DC
  Administered 2020-11-21: 1000 ug via ORAL
  Filled 2020-11-21: qty 1

## 2020-11-21 MED ORDER — LORAZEPAM 2 MG/ML IJ SOLN: 0.0000 mg | Freq: Two times a day (BID) | INTRAMUSCULAR | Status: DC

## 2020-11-21 MED ORDER — PANTOPRAZOLE SODIUM 40 MG PO TBEC
40.0000 mg | DELAYED_RELEASE_TABLET | Freq: Every day | ORAL | Status: DC
  Administered 2020-11-21: 40 mg via ORAL
  Filled 2020-11-21: qty 1

## 2020-11-21 MED ORDER — FOLIC ACID 1 MG PO TABS
1.0000 mg | ORAL_TABLET | Freq: Every day | ORAL | Status: DC
  Administered 2020-11-21: 1 mg via ORAL
  Filled 2020-11-21: qty 1

## 2020-11-21 MED ORDER — MAGNESIUM OXIDE -MG SUPPLEMENT 400 (240 MG) MG PO TABS
400.0000 mg | ORAL_TABLET | Freq: Every day | ORAL | Status: DC
  Administered 2020-11-21: 400 mg via ORAL
  Filled 2020-11-21: qty 1

## 2020-11-21 MED ORDER — POTASSIUM CHLORIDE ER 10 MEQ PO TBCR: 10.0000 meq | EXTENDED_RELEASE_TABLET | Freq: Every day | ORAL | 0 refills | Status: DC

## 2020-11-21 MED ORDER — THIAMINE HCL 100 MG PO TABS
100.0000 mg | ORAL_TABLET | Freq: Every day | ORAL | Status: DC
  Filled 2020-11-21: qty 1

## 2020-11-21 MED ORDER — FLUTICASONE FUROATE-VILANTEROL 200-25 MCG/INH IN AEPB
1.0000 | INHALATION_SPRAY | Freq: Every day | RESPIRATORY_TRACT | Status: DC
  Filled 2020-11-21: qty 28

## 2020-11-21 MED ORDER — THIAMINE HCL 100 MG/ML IJ SOLN
100.0000 mg | Freq: Every day | INTRAMUSCULAR | Status: DC
Start: 2020-11-21 — End: 2020-11-21

## 2020-11-21 MED ORDER — POTASSIUM CHLORIDE CRYS ER 20 MEQ PO TBCR
40.0000 meq | EXTENDED_RELEASE_TABLET | Freq: Once | ORAL | Status: AC
  Administered 2020-11-21: 40 meq via ORAL
  Filled 2020-11-21: qty 2

## 2020-11-21 MED ORDER — HYDROXYZINE HCL 25 MG PO TABS
25.0000 mg | ORAL_TABLET | Freq: Three times a day (TID) | ORAL | Status: DC | PRN
  Administered 2020-11-21: 25 mg via ORAL
  Filled 2020-11-21: qty 1

## 2020-11-21 MED ORDER — AMLODIPINE-OLMESARTAN 10-40 MG PO TABS: 1.0000 | ORAL_TABLET | Freq: Every day | ORAL | Status: DC

## 2020-11-21 MED ORDER — MOMETASONE FURO-FORMOTEROL FUM 100-5 MCG/ACT IN AERO
2.0000 | INHALATION_SPRAY | Freq: Two times a day (BID) | RESPIRATORY_TRACT | Status: DC
  Filled 2020-11-21: qty 8.8

## 2020-11-21 MED ORDER — LORAZEPAM 1 MG PO TABS
0.0000 mg | ORAL_TABLET | Freq: Four times a day (QID) | ORAL | Status: DC
  Administered 2020-11-21: 1 mg via ORAL
  Filled 2020-11-21: qty 1

## 2020-11-21 MED ORDER — VITAMIN D (ERGOCALCIFEROL) 1.25 MG (50000 UNIT) PO CAPS: 50000.0000 [IU] | ORAL_CAPSULE | ORAL | Status: DC

## 2020-11-21 MED ORDER — LORAZEPAM 1 MG PO TABS: 0.0000 mg | ORAL_TABLET | Freq: Two times a day (BID) | ORAL | Status: DC

## 2020-11-21 MED ORDER — IRBESARTAN 300 MG PO TABS
300.0000 mg | ORAL_TABLET | Freq: Every day | ORAL | Status: DC
  Administered 2020-11-21: 300 mg via ORAL
  Filled 2020-11-21: qty 1

## 2020-11-21 NOTE — ED Notes (Signed)
Pt provided fresh scrubs to change into

## 2020-11-21 NOTE — ED Notes (Signed)
Contacted patient son [name], he stated that he was out of town and not able to come get her but he would try to get in touch with Dominica Severin (pt significant other).

## 2020-11-21 NOTE — Discharge Instructions (Addendum)
[facility]  - Admissions are currently completed Monday through Friday at [facility]; both appointments and walk-ins are accepted.  Any individual that is a [facility] resident may present for a substance abuse screening and assessment for admission.  A person may be referred by numerous sources or self-refer.   Potential clients will be screened for medical necessity and appropriateness for the program.  Clients must meet criteria for high-intensity residential treatment services.  If clinically appropriate, a client will continue with the comprehensive clinical assessment and intake process, as well as enrollment in the [facility].  The sole purpose of [facility] is to provide a supportive and safe environment in which persons suffering from drug and alcohol addiction may find sobriety.  [facility] acknowledges that this takes time and as such anticipates a minimum stay of 30 days with the actual length of stay being determined by the persons served progress. During the persons served stay, they will have access to addiction education, an in-depth study of coping mechanisms, group therapy and individual sessions s needed. Family therapy is also available for involved families and significant others.  [facility] will not refuse treatment to any [facility] resident suffering from drug and alcohol addiction because of the inability to pay.  The singular goal of this program is to restore the persons served of this service to sobriety.  Further, the primary objectives of this program are to return persons served to a satisfying and productive lifestyle or if they have never experienced a life with turmoil and unhappiness to assist them in learning how to have a life free from addiction, abuse and dissatisfaction.  It is up to the person served to determine what they want from life and the staff will assist them in determining  if it is reasonable and obtainable.  Address: [address] [place], [place] [address] Admin Hours: Mon-Fri 8AM to [place] Hours: 24/7 Phone: [phone] Fax: [phone]  [facility] (Detox) Facility Based Crisis:  These are 3 locations for services: Please call before arrival   [facility] Address: [address] [place], [place] [address] Phone: [phone]  [facility] Address: [address] [place], [place] [address] Phone#: [phone]  [facility] Address: [address] [place] [address], [place] [address] Phone#: [phone]   [facility] ([facility]): (offers outpatient therapy and intensive outpatient substance abuse therapy).  [address] [place], [place] [address] Phone: [phone]  [facility]: Offers FREE recovery skills classes, support groups, 1:1 Peer Support, and Compeer Classes. [address], [place], [place] [address] Phone: [phone] (Call to complete intake).  [facility] [address] [place], [place] [address] Phone: [phone] ext: [facility] provides food, shelter and other programs and services to the homeless men of [place] through our [employer].  By offering safe shelter, three meals a day, clean clothing, Biblical counseling, financial planning, vocational training, GED/education and employment assistance, we've helped mend the shattered lives of many homeless men since opening in 1974.  We have approximately 267 beds available, with a max of 312 beds including mats for emergency situations and currently house an average of 270 men a night.  Prospective Client Check-In Information Photo ID Required (State/ Out of State/ [facility]) - if photo ID is not available, clients are required to have a printout of a police/sheriff's criminal  history report. Help out with chores  around the [facility]. No sex offender of any type (pending, charged, registered and/or any other sex related offenses) will be permitted to check in. Must be willing to abide by all rules, regulations, and policies established by the [employer]. The following will be provided - shelter, food, clothing, and biblical counseling. If you or someone you know is in need of assistance at our [facility] shelter in [place], [place], please call [phone] ext. [phone].  [facility]-will provide timely access to mental health services for children and adolescents (4-17) and adults presenting in a mental health crisis. The program is designed for those who need urgent Behavioral Health or Substance Use treatment and are not experiencing a medical crisis that would typically require an emergency room visit.    [place], [place] [address] Phone: [phone] [email or web address]: Phone#: [phone]  The [facility] SA Intensive Outpatient Program (SAIOP) means structured individual and group addiction activities and services that are provided at an outpatient program designed to assist adult and adolescent consumers to begin recovery and learn skills for recovery maintenance. The [facility] program is offered at least 3 hours a day, 3 days a week.SAIOP services shall include a structured program consisting of, but not limited to, the following services: Individual counseling and support; Group counseling and support; Family counseling, training or support; Biochemical assays to identify recent drug use (e.g., urine drug screens); Strategies for relapse prevention to include community and social support systems in treatment; Life skills; Crisis contingency planning; Disease Management; and Treatment support activities that have been adapted or specifically designed for persons with physical disabilities, or persons  with co-occurring disorders of mental illness and substance abuse/dependence or mental retardation/developmental disability and substance abuse/dependence. Phone: [phone]  Address:   [facility] will also offer the following outpatient services: (Monday through Friday 8am-5pm)   Partial Hospitalization Program (PHP) Substance Abuse Intensive Outpatient Program (SA-IOP) Group Therapy Medication Management Peer Living Room We also provide (24/7):  Assessments: Our mental health clinician and providers will conduct a focused mental health evaluation, assessing for immediate safety concerns and further mental health needs. Referral: Our team will provide resources and help connect to community based mental health treatment, when indicated, including psychotherapy, psychiatry, and other specialized behavioral health or substance use disorder services (for those not already in treatment). Transitional Care: Our team providers in person bridging and/or telephonic follow-up during the patient's transition to outpatient services.  [facility] 24-Hour Call Center: [phone] Behavioral Health Crisis Line: [phone]

## 2020-11-21 NOTE — ED Notes (Signed)
Pt requesting meds for back pain. 

## 2020-11-21 NOTE — Progress Notes (Signed)
CSW provided substance abuse resources in the patient's AVS to utilize upon discharge listed below:    [facility] - Admissions are currently completed Monday through Friday at [facility]; both appointments and walk-ins are accepted.  Any individual that is a [facility] resident may present for a substance abuse screening and assessment for admission.  A person may be referred by numerous sources or self-refer.   Potential clients will be screened for medical necessity and appropriateness for the program.  Clients must meet criteria for high-intensity residential treatment services.  If clinically appropriate, a client will continue with the comprehensive clinical assessment and intake process, as well as enrollment in the [facility].  The sole purpose of [facility] is to provide a supportive and safe environment in which persons suffering from drug and alcohol addiction may find sobriety.  [facility] acknowledges that this takes time and as such anticipates a minimum stay of 30 days with the actual length of stay being determined by the persons served progress. During the persons served stay, they will have access to addiction education, an in-depth study of coping mechanisms, group therapy and individual sessions s needed. Family therapy is also available for involved families and significant others.  [facility] will not refuse treatment to any [facility] resident suffering from drug and alcohol addiction because of the inability to pay.  The singular goal of this program is to restore the persons served of this service to sobriety.  Further, the primary objectives of this program are to return persons served to a satisfying and productive lifestyle or if they have never experienced a life with turmoil and unhappiness to assist them in learning how to have a life free from addiction, abuse and dissatisfaction.  It is up to  the person served to determine what they want from life and the staff will assist them in determining if it is reasonable and obtainable.  Address: [address] [place], [place] [address] Admin Hours: Mon-Fri 8AM to [place] Hours: 24/7 Phone: [phone] Fax: [phone]  [facility] (Detox) Facility Based Crisis:  These are 3 locations for services: Please call before arrival   [facility] Address: [address] [place], [place] [address] Phone: [phone]  [facility] Address: [address] [place], [place] [address] Phone#: [phone]  [facility] Address: [address] [place], [place] [address] Phone#: [phone]   [facility] ([facility]): (offers outpatient therapy and intensive outpatient substance abuse therapy).  [address], [place], [place] [address] Phone: [phone]  [facility]: Offers FREE recovery skills classes, support groups, 1:1 Peer Support, and Compeer Classes. [address] [place], [place] [address] Phone: [phone] (Call to complete intake).  [facility] [address] [place], [place] [address] Phone: [phone] ext: [facility] provides food, shelter and other programs and services to the homeless men of [place] through our [employer].  By offering safe shelter, three meals a day, clean clothing, Biblical counseling, financial planning, vocational training, GED/education and employment assistance, we've helped mend the shattered lives of many homeless men since opening in 1974.  We have approximately 267 beds available, with a max of 312 beds including mats for emergency situations and currently house an average of 270 men a night.  Prospective Client Check-In Information Photo ID Required (State/ Out of State/ [facility]) -  if photo ID is not available, clients  are required to have a printout of a police/sheriff's criminal history report. Help out with chores around the [place]. No sex offender of any type (pending, charged, registered and/or any other sex related offenses) will be permitted to check in. Must be willing to abide by all rules, regulations, and policies established by the [employer]. The following will be provided - shelter, food, clothing, and biblical counseling. If you or someone you know is in need of assistance at our [facility] shelter in [place], [place], please call [phone] ext. [phone].  [facility]-will provide timely access to mental health services for children and adolescents (4-17) and adults presenting in a mental health crisis. The program is designed for those who need urgent Behavioral Health or Substance Use treatment and are not experiencing a medical crisis that would typically require an emergency room visit.    [place], [place] [address] Phone: [phone] [email or web address]: Phone#: [phone]  The [facility] SA Intensive Outpatient Program (SAIOP) means structured individual and group addiction activities and services that are provided at an outpatient program designed to assist adult and adolescent consumers to begin recovery and learn skills for recovery maintenance. The [facility] program is offered at least 3 hours a day, 3 days a week.SAIOP services shall include a structured program consisting of, but not limited to, the following services: Individual counseling and support; Group counseling and support; Family counseling, training or support; Biochemical assays to identify recent drug use (e.g., urine drug screens); Strategies for relapse prevention to include community and social support systems in treatment; Life skills; Crisis contingency planning; Disease Management; and Treatment support  activities that have been adapted or specifically designed for persons with physical disabilities, or persons with co-occurring disorders of mental illness and substance abuse/dependence or mental retardation/developmental disability and substance abuse/dependence. Phone: [phone]  Address:   The [facility] will also offer the following outpatient services: (Monday through Friday 8am-5pm)    Partial Hospitalization Program (PHP)  Substance Abuse Intensive Outpatient Program (SA-IOP)  Group Therapy  Medication Management  Peer Living Room We also provide (24/7):  Assessments: Our mental health clinician and providers will conduct a focused mental health evaluation, assessing for immediate safety concerns and further mental health needs. Referral: Our team will provide resources and help connect to community based mental health treatment, when indicated, including psychotherapy, psychiatry, and other specialized behavioral health or substance use disorder services (for those not already in treatment). Transitional Care: Our team providers in person bridging and/or telephonic follow-up during the patient's transition to outpatient services.  [facility]: [phone] Behavioral Health Crisis Line: [phone]  Glennie [place], MSW, [place], [place] Phone: [phone] Disposition/TOC

## 2020-11-21 NOTE — ED Notes (Signed)
Pt belongings inventoried and placed in [place] locker #3

## 2020-11-21 NOTE — ED Provider Notes (Signed)
Patient was evaluated by behavioral health services who felt that she would benefit from outpatient services, but did not meet criteria for inpatient psychiatric services.  Patient remains hemodynamically stable this morning.  She is resting comfortably.  12:20 pm update- Patient is medically cleared for discharge.  Her potassium on repeat has normalized to 3.3.  I can prescribe her some potassium pills at home.  Nursing staff is contacted her significant other who will attempt to come pick her up.  She was provided with resources for outpatient detox.  And outpatient psychiatry.   Wyvonnia Dusky, MD 11/21/20 [phone]

## 2020-11-21 NOTE — BH Assessment (Incomplete)
Comprehensive Clinical Assessment (CCA) Note  11/21/2020 Stacy Dalton [id number]  DISPOSITION:  Gave clinical report to S. Jerelene Redden, NP and Eduard Clos, MD who determined that Pt does not meet inpatient criteria.  Pt is psych-cleared and to be provided outpatient substance use resources.  The patient demonstrates the following risk factors for suicide: Chronic risk factors for suicide include: substance use disorder. Acute risk factors for suicide include: recent discharge from inpatient psychiatry. Protective factors for this patient include: positive social support and responsibility to others (children, family). Considering these factors, the overall suicide risk at this point appears to be low. Patient is appropriate for outpatient follow up.  [facility] from 11/20/2020 in [facility] from 10/30/2020 in [facility] from 10/26/2020 in [facility] Low Risk No Risk No Risk     Chief Complaint:  Chief Complaint  Patient presents with  . Back Pain  . Emesis  . Suicidal    Pt admitted to making suicidal statements; also requested help with alcohol use.   Visit Diagnosis: Alcohol Use Disorder, Dependence  NARRATIVE:  Pt is a 70 year old female who presented to [facility] due to alcohol use and making a suicidal statement.  Pt lives in [place] with her fiancee, and she is retired.  Pt is currently under IVC.  Pt does not have an outpatient psychiatrist or therapist, and her PCP Stacy Dun, FNP) prescribes Cymbalta for treatment of depressive symptoms.  Pt is not compliant.  Pt presented to the ED with complaint of chronic back pain, intoxication, and suicidal ideation.  Per family's report, Pt stated that she wanted to die.  Family also reported that she drove her car in a way that suggested she was trying to die by driving in front of traffic.  Author assessed Pt.  Pt  stated that she has been depressed for years, but that she is not suicidal and has never attempted suicide.  Pt admitted to making the statement that she did not want to live, but also stated,''I didn't mean it.  I was drinking.''  Pt endorsed despondency, episodes of hopelessness and worthlessness, and low energy.  Pt denied current suicidal ideation, homicidal ideation, hallucination, and self-injurious behavior.    Pt endorsed poor appetite and sleep, which she attributed to chronic back pain.  Pt stated that she has had back pain for over a year, and that over the last year, she has tried to manage the pain through daily use of alcohol.  Pt's BAC on admission was .367.  Pt reported that she consumes several wine coolers a day.  Last use was 11/20/2020.  Pt reported that she wants help with alcohol use.  Pt reported that she has been treated for alcohol use before --most recently, she spent seven days at [facility] for alcohol use and withdrawal symptoms.  Withdrawal symptoms include agitation and tremors.  During assessment, Pt presented as alert and oriented.  She had good eye contact and was cooperative.  Demeanor was calm.  Pt was gowned, and she appeared appropriately groomed.  Pt's mood was sad and preoccupied (with back pain), and affect was blunted.  Pt's speech was normal in rate, rhythm, and volume.  Thought processes were within normal range, and thought content was logical and goal-oriented.  There was no evidence of delusion.  Memory and concentration were intact.  Insight, judgment, and impulse control were fair to poor  as evidenced by ongoing alcohol use.  CCA Screening, Triage and Referral (STR)  Patient Reported Information How did you hear about [place]? Self  Referral name: No data recorded Referral phone number: No data recorded  Whom do you see for routine medical problems? Primary Care  Practice/Facility Name: [facility]  Practice/Facility Phone Number: No data  recorded Name of Contact: Stacy Dun, FNP  Contact Number: [phone]  Contact Fax Number: No data recorded Prescriber Name: Stacy Dalton, [place]  Prescriber Address (if known): No data recorded  What Is the Reason for Your Visit/Call Today? Pt brought to ED due to alcohol use, suicidal statements; BAC on admission was .367  How Long Has This Been Causing You Problems? > than 6 months  What Do You Feel Would Help You the Most Today? Alcohol or Drug Use Treatment; Treatment for Depression or other mood problem   Have You Recently Been in Any Inpatient Treatment (Hospital/Detox/Crisis Center/28-Day Program)? Yes  Name/Location of Program/Hospital:[facility]  How Long Were You There? 7 days  When Were You Discharged? No data recorded  Have You Ever Received Services From [facility] Before? Yes  Who Do You [facility] at [facility]? ED   Have You Recently Had Any Thoughts About Hurting Yourself? Yes  Are You Planning to Commit Suicide/Harm Yourself At This time? No   Have you Recently Had Thoughts About [place]? No  Explanation: No data recorded  Have You Used Any Alcohol or Drugs in the Past 24 Hours? Yes  How Long Ago Did You Use Drugs or Alcohol? No data recorded What Did You Use and How Much? Pt endorsed daily use of alcohol -- several wine coolers.  BAC on admission was .6   Do You Currently Have a Therapist/Psychiatrist? No  Name of Therapist/Psychiatrist: No data recorded  Have You Been Recently Discharged From Any Office Practice or Programs? No  Explanation of Discharge From Practice/Program: No data recorded    CCA Screening Triage Referral Assessment Type of Contact: Tele-Assessment  Is this Initial or Reassessment? Initial Assessment  Date Telepsych consult ordered in [facility]:  11/20/2020  Time Telepsych consult ordered in [facility]:  No data recorded  Patient Reported Information Reviewed? Yes  Patient Left Without Being  Seen? No data recorded Reason for Not Completing Assessment: No data recorded  Collateral Involvement: NA   Does Patient Have a Valinda? No data recorded Name and Contact of Legal Guardian: No data recorded If Minor and Not Living with Parent(s), Who has Custody? No data recorded Is CPS involved or ever been involved? Never  Is APS involved or ever been involved? Never   Patient Determined To Be At Risk for Harm To Self or Others Based on Review of Patient Reported Information or Presenting Complaint? No data recorded Method: No data recorded Availability of Means: No data recorded Intent: No data recorded Notification Required: No data recorded Additional Information for Danger to Others Potential: No data recorded Additional Comments for Danger to Others Potential: No data recorded Are There Guns or Other Weapons in Your Home? No data recorded Types of Guns/Weapons: No data recorded Are These Weapons Safely Secured?                            No data recorded Who Could Verify You Are Able To Have These Secured: No data recorded Do You Have any Outstanding Charges, Pending Court Dates, Parole/Probation? No data  recorded Contacted To Inform of Risk of Harm To Self or Others: No data recorded  [facility] of Assessment: [facility]   Does Patient Present under Involuntary Commitment? No  IVC Papers Initial File Date: No data recorded  [place] of Residence: [place]   Patient Currently Receiving the Following Services: Medication Management (through PCP)   Determination of Need: Emergent (2 hours)   Options For Referral: Inpatient Hospitalization; Medication Management; [facility]     CCA Biopsychosocial Intake/Chief Complaint:  Pt made suicidal statements on 5/21; endorsed ongoing depression, but denied current suicidal ideation; endorsed alcohol use  Current Symptoms/Problems: Pt endorsed alcohol use, requested detox; Pt also endorsed recent  suicidal ideation and per family, attempt to self-harm.   Patient Reported Schizophrenia/Schizoaffective Diagnosis in Past: No   Strengths: Supportive family  Preferences: Detox  Abilities: No data recorded  Type of Services Patient Feels are Needed: Detox   Initial Clinical Notes/Concerns: Pt denied current suicidal ideation, but amditted to making suicidal statement   Mental Health Symptoms Depression:  Hopelessness; Increase/decrease in appetite; Change in energy/activity; Fatigue; Sleep (too much or little); Worthlessness   Duration of Depressive symptoms: Greater than two weeks   Mania:  N/A   Anxiety:   Tension   Psychosis:  None   Duration of Psychotic symptoms: No data recorded  Trauma:  No data recorded  Obsessions:  N/A   Compulsions:  N/A   Inattention:  N/A   Hyperactivity/Impulsivity:  N/A   Oppositional/Defiant Behaviors:  N/A   Emotional Irregularity:  Potentially harmful impulsivity   Other Mood/Personality Symptoms:  No data recorded   Mental Status Exam Appearance and self-care  Stature:  Average   Weight:  Average weight   Clothing:  Casual   Grooming:  Normal   Cosmetic use:  Age appropriate   Posture/gait:  Normal   Motor activity:  Not Remarkable   Sensorium  Attention:  Normal   Concentration:  Normal   Orientation:  X5   Recall/memory:  Normal   Affect and Mood  Affect:  Appropriate   Mood:  Depressed; Negative   Relating  Eye contact:  Normal   Facial expression:  Responsive   Attitude toward examiner:  Cooperative   Thought and Language  Speech flow: Clear and Coherent   Thought content:  Appropriate to Mood and Circumstances   Preoccupation:  None   Hallucinations:  None   Organization:  No data recorded  [employer] of Knowledge:  Average   Intelligence:  Average   Abstraction:  Normal   Judgement:  Poor   Reality Testing:  Adequate   Insight:  Poor   Decision Making:   Impulsive   Social Functioning  Social Maturity:  Impulsive   Social Judgement:  Heedless   Stress  Stressors:  Illness (Chronic back pain)   Coping Ability:  Deficient supports   Skill Deficits:  None   Supports:  Family     Religion:    Leisure/Recreation:    Exercise/Diet: Exercise/Diet Do You Exercise?: No Have You Gained or Lost A Significant Amount of Weight in the Past Six Months?: No Do You Follow a Special Diet?: No Do You Have Any Trouble Sleeping?: Yes Explanation of Sleeping Difficulties: Pt indicated that she does not sleep due to back pain   CCA Employment/Education Employment/Work Situation: Employment / Work Situation 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 by current illness: No  Education: Education Is Patient Currently Attending School?: No   CCA  Family/Childhood History Family and Relationship History: Family history Marital status: Long term relationship What types of issues is patient dealing with in the relationship?: Pt is engaged What is your sexual orientation?: Heterosexual  Childhood History:  Childhood History By whom was/is the patient raised?: Both parents Patient's description of current relationship with people who raised him/her: Parents are deceased Does patient have siblings?: Yes Number of Siblings: 9 Description of patient's current relationship with siblings: Several siblings are deceased Did patient suffer any verbal/emotional/physical/sexual abuse as a child?: No Did patient suffer from severe childhood neglect?: No Has patient ever been sexually abused/assaulted/raped as an adolescent or adult?: No Was the patient ever a victim of a crime or a disaster?: No Witnessed domestic violence?: No Has patient been affected by domestic violence as an adult?: No  Child/Adolescent Assessment:     CCA Substance Use Alcohol/Drug Use: Alcohol / Drug Use Pain Medications: Please see  MAR Prescriptions: Please see MAR Over the Counter: Please see MAR History of alcohol / drug use?: Yes Withdrawal Symptoms: Agitation,Tremors,Irritability,Nausea / Vomiting Substance #1 Name of Substance 1: Alcohol 1 - Amount (size/oz): Varied 1 - Frequency: Daily 1 - Duration: Ongoing 1 - Last Use / Amount: 11/20/2020; claims to have consumed several wine coolers.  BAC on admission was .367 1 - Method of Aquiring: Purchase 1- Route of Use: Oral ingestion                       ASAM's:  Six Dimensions of Multidimensional Assessment  Dimension 1:  Acute Intoxication and/or Withdrawal Potential:   Dimension 1:  Description of individual's past and current experiences of substance use and withdrawal: Pt reported recent and current withdrawal  Dimension 2:  Biomedical Conditions and Complications:   Dimension 2:  Description of patient's biomedical conditions and  complications: Back pain  Dimension 3:  Emotional, Behavioral, or Cognitive Conditions and Complications:  Dimension 3:  Description of emotional, behavioral, or cognitive conditions and complications: Indicated history of depression  Dimension 4:  Readiness to Change:  Dimension 4:  Description of Readiness to Change criteria: Some awareness -- contemplation  Dimension 5:  Relapse, Continued use, or Continued Problem Potential:  Dimension 5:  Relapse, continued use, or continued problem potential critiera description: Possible relapse  Dimension 6:  Recovery/Living Environment:     ASAM Severity Score: ASAM's Severity Rating Score: 12  ASAM Recommended Level of Treatment: ASAM Recommended Level of Treatment: Level II Intensive Outpatient Treatment   Substance use Disorder (SUD) Substance Use Disorder (SUD)  Checklist Symptoms of Substance Use: Evidence of withdrawal (Comment),Continued use despite having a persistent/recurrent physical/psychological problem caused/exacerbated by use  Recommendations for  Services/Supports/Treatments: Recommendations for Services/Supports/Treatments Recommendations For Services/Supports/Treatments: SAIOP (Substance Abuse Intensive Outpatient Program),Individual Therapy  DSM5 Diagnoses: Patient Active Problem List   Diagnosis Date Noted  . Alcohol use disorder, severe, dependence ([place]) 10/08/2020  . Alcohol abuse 06/21/2020  . Multifocal pneumonia 09/19/2018  . Large bowel obstruction ([place])   . Small bowel obstruction ([place]) 07/20/2018  . Hypomagnesemia 07/20/2018  . Cough 03/14/2018  . Fatigue 03/14/2018  . Polypharmacy 02/23/2017  . Hypokalemia 02/23/2017  . Toxic metabolic encephalopathy 87/56/4332  . Anxiety 02/22/2017  . GERD (gastroesophageal reflux disease) 02/22/2017  . Pain medication agreement signed 10/10/2016  . Opioid dependence ([place]) 10/10/2016  . Status post colostomy takedown 04/12/2016  . Chronic constipation 12/03/2015  . Peritonitis with abscess of intestine ([place]) 11/29/2015  . COPD exacerbation ([place]) 09/20/2015  . Osteopenia  06/10/2014  . Vitamin D deficiency 06/10/2014  . Hypothyroidism   . Depression 11/18/2012  . Insomnia 11/18/2012  . Chronic pain syndrome 11/18/2012  . HLD (hyperlipidemia) 09/01/2012  . S/P colostomy ([place]) 04/19/2012  . Normocytic anemia 10/15/2011  . Hypertension 10/13/2011  . Chronic back pain 10/13/2011  . COPD (chronic obstructive pulmonary disease) ([place]) 10/09/2011    Patient Centered Plan: Patient is on the following Treatment Plan(s):     Referrals to Alternative Service(s): Referred to Alternative Service(s):   Place:   Date:   Time:    Referred to Alternative Service(s):   Place:   Date:   Time:    Referred to Alternative Service(s):   Place:   Date:   Time:    Referred to Alternative Service(s):   Place:   Date:   Time:     Marlowe Aschoff, [facility]

## 2020-11-21 NOTE — ED Notes (Signed)
Able to get in contact with Dominica Severin (pt significant other).  He will be able to pick the patient up after discharged.

## 2020-11-21 NOTE — ED Notes (Signed)
Lab called: alcohol 367 and potassium 2.7

## 2020-11-21 NOTE — ED Notes (Signed)
Attempted to call Stacy Dalton (Pt significant other ) to come pick her up. No answer left VM, per Cyril Mourning RN

## 2020-11-21 NOTE — ED Notes (Signed)
Patient taken to the restroom and changed.  Patient requested for barrier cream, placed on patient in bathroom.  Assisted patient back to the bed.  Drink and food given.

## 2020-11-21 NOTE — ED Provider Notes (Signed)
[facility] EMERGENCY DEPARTMENT Provider Note   CSN: [id number] Arrival date & time: 11/20/20  2211     History Chief Complaint  Patient presents with  . Back Pain  . Emesis  . Suicidal    Stacy Dalton is a 70 y.o. female.  Patient with past medical history notable for alcohol abuse presents to the emergency department with a chief complaint of SI.  She states that she wants to die.  Per family, she attempted to drive out in front of traffic in an attempt to harm her self.  She also complains of low back pain and right thumb pain.  She denies any treatments prior to arrival.  Denies any other associated symptoms.  The history is provided by the patient. No language interpreter was used.       Past Medical History:  Diagnosis Date  . Chronic pain   . COPD (chronic obstructive pulmonary disease) ([place])    told has copd, no current inhaler use  . Cough   . Depression   . GERD (gastroesophageal reflux disease)   . Hypertension   . Hypothyroidism   . Insomnia   . Migraine   . Migraine   . Osteopenia   . Pain management   . Panic attacks   . Small bowel obstruction [facility])     Patient Active Problem List   Diagnosis Date Noted  . Alcohol dependence with withdrawal with complication ([place]) 17/61/6073  . Alcohol abuse 06/21/2020  . Multifocal pneumonia 09/19/2018  . Large bowel obstruction ([place])   . Small bowel obstruction ([place]) 07/20/2018  . Hypomagnesemia 07/20/2018  . Cough 03/14/2018  . Fatigue 03/14/2018  . Polypharmacy 02/23/2017  . Hypokalemia 02/23/2017  . Toxic metabolic encephalopathy 71/12/2692  . Anxiety 02/22/2017  . GERD (gastroesophageal reflux disease) 02/22/2017  . Pain medication agreement signed 10/10/2016  . Opioid dependence ([place]) 10/10/2016  . Status post colostomy takedown 04/12/2016  . Chronic constipation 12/03/2015  . Peritonitis with abscess of intestine ([place]) 11/29/2015  . COPD exacerbation ([place]) 09/20/2015  .  Osteopenia 06/10/2014  . Vitamin D deficiency 06/10/2014  . Hypothyroidism   . Depression 11/18/2012  . Insomnia 11/18/2012  . Chronic pain syndrome 11/18/2012  . HLD (hyperlipidemia) 09/01/2012  . S/P colostomy ([place]) 04/19/2012  . Normocytic anemia 10/15/2011  . Hypertension 10/13/2011  . Chronic back pain 10/13/2011  . COPD (chronic obstructive pulmonary disease) ([place]) 10/09/2011    Past Surgical History:  Procedure Laterality Date  . ABDOMINAL HYSTERECTOMY    . COLON SURGERY    . COLOSTOMY CLOSURE  04/19/2012   Procedure: COLOSTOMY CLOSURE;  Surgeon: Adin Hector, MD;  Location: [facility] ORS;  Service: General;  Laterality: N/A;  Laparotomy, Resection and Closure of Colostomy  . COLOSTOMY TAKEDOWN N/A 04/12/2016   Procedure: [place] TAKEDOWN;  Surgeon: Johnathan Hausen, MD;  Location: [facility]S;  Service: General;  Laterality: N/A;  . INCONTINENCE SURGERY    . LAPAROTOMY  10/04/2011, colostomy also   Procedure: EXPLORATORY LAPAROTOMY;  Surgeon: Adin Hector, MD;  Location: [facility];  Service: General;  Laterality: N/A;  left partial colectomy with colostomy  . LAPAROTOMY  04/19/2012   Procedure: EXPLORATORY LAPAROTOMY;  Surgeon: Adin Hector, MD;  Location: [facility];  Service: General;  Laterality: N/A;  . LAPAROTOMY N/A 11/29/2015   Procedure: EXPLORATORY LAPAROTOMY; SUBTOTAL COLECTOMY WITH HARTMAN PROCEDURE AND END COLOSTOMY;  Surgeon: Johnathan Hausen, MD;  Location: [facility];  Service: General;  Laterality: N/A;  . TUBAL  LIGATION    . VENTRAL HERNIA REPAIR  04/19/2012   Procedure: HERNIA REPAIR VENTRAL ADULT;  Surgeon: Adin Hector, MD;  Location: [facility];  Service: General;  Laterality: N/A;     OB History    Gravida      Para      Term      Preterm      AB      Living  5     SAB      IAB      Ectopic      Multiple      Live Births              Family History  Problem Relation Age of Onset  . Heart disease Mother   . Hypertension Mother   . Cancer  Sister        sinus  . Diabetes Brother   . Heart disease Brother   . Thyroid disease Brother   . Cancer Sister        abdominal ?  . Thyroid disease Sister   . Cancer Other        GE junction adenocarcinoma  . Emphysema Father   . Stroke Father   . Hypertension Father   . Heart disease Father   . COPD Sister   . Thyroid disease Sister   . Thyroid disease Sister   . Diabetes Brother   . Thyroid disease Brother   . Thyroid disease Brother   . Hypertension Brother   . Post-traumatic stress disorder Brother   . COPD Brother   . Thyroid disease Brother   . Thyroid disease Brother   . Hypertension Brother   . Transient ischemic attack Brother     Social History   Tobacco Use  . Smoking status: Former Smoker    Packs/day: 1.50    Years: 20.00    Pack years: 30.00    Types: Cigarettes    Quit date: 10/04/1998    Years since quitting: 22.1  . Smokeless tobacco: Never Used  Vaping Use  . Vaping Use: Never used  Substance Use Topics  . Alcohol use: Yes    Comment: 1bottle vodka -3 days   . Drug use: No    Home Medications Prior to Admission medications   Medication Sig Start Date End Date Taking? Authorizing Provider  amLODipine-olmesartan (AZOR) 10-40 MG tablet Take 1 tablet by mouth daily. 09/27/20   [name], FNP  amoxicillin-clavulanate (AUGMENTIN) 875-125 MG tablet Take 1 tablet by mouth 2 (two) times daily. 11/05/20   Evelina Dun A, FNP  Aspirin-Caffeine (BC FAST PAIN RELIEF) 845-65 MG PACK Take 1 packet by mouth daily as needed (pain or headache). Patient not taking: Reported on 11/05/2020    [provider]  cephALEXin (KEFLEX) 500 MG capsule Take 1 capsule (500 mg total) by mouth 2 (two) times daily. 11/08/20   [name], FNP  DULoxetine (CYMBALTA) 60 MG capsule Take 1 capsule (60 mg total) by mouth daily. Patient not taking: Reported on 11/05/2020 04/27/20   Evelina Dun A, FNP  fluticasone furoate-vilanterol (BREO ELLIPTA) 200-25 MCG/INH  AEPB Inhale 1 puff into the lungs daily. Patient not taking: Reported on 11/05/2020 01/16/20   Evelina Dun A, FNP  Fluticasone-Salmeterol (ADVAIR) 100-50 MCG/DOSE AEPB Inhale 1 puff into the lungs daily as needed (shortness of breath). Patient not taking: Reported on 11/05/2020 02/09/20   [provider]  folic acid (FOLVITE) 1 MG tablet Take 1 tablet (1 mg  total) by mouth daily. Patient not taking: Reported on 11/05/2020 08/11/20   Terald Sleeper, MD  hydrOXYzine (VISTARIL) 25 MG capsule Take 1 capsule (25 mg total) by mouth 3 (three) times daily as needed for anxiety. Patient not taking: Reported on 11/05/2020 08/27/20   Junie Spencer, FNP  levothyroxine (SYNTHROID) 50 MCG tablet Take 1 tablet (50 mcg total) by mouth daily before breakfast. Patient not taking: Reported on 11/05/2020 06/22/20   Jannifer Rodney A, FNP  magnesium oxide (MAG-OX) 400 (241.3 Mg) MG tablet Take 1 tablet (400 mg total) by mouth daily. Patient not taking: No sig reported 10/12/20   Arnetha Courser, MD  ondansetron (ZOFRAN) 4 MG tablet TAKE 1 TABLET(4 MG) BY MOUTH EVERY 8 HOURS AS NEEDED FOR NAUSEA Patient not taking: Reported on 11/05/2020 07/19/20   Jannifer Rodney A, FNP  pantoprazole (PROTONIX) 40 MG tablet Take 1 tablet (40 mg total) by mouth daily. Patient not taking: No sig reported 10/12/20   Arnetha Courser, MD  thiamine 100 MG tablet Take 1 tablet (100 mg total) by mouth daily. Patient not taking: Reported on 11/05/2020 10/12/20   Arnetha Courser, MD  vitamin B-12 (CYANOCOBALAMIN) 1000 MCG tablet Take 1 tablet (1,000 mcg total) by mouth daily. Patient not taking: Reported on 11/05/2020 10/12/20   Arnetha Courser, MD  Vitamin D, Ergocalciferol, (DRISDOL) 1.25 MG (50000 UNIT) CAPS capsule Take 1 capsule (50,000 Units total) by mouth every 7 (seven) days. Patient not taking: Reported on 11/05/2020 06/22/20   Junie Spencer, FNP    Allergies    Librium [chlordiazepoxide], Toradol [ketorolac tromethamine],  Butalbital-apap-caffeine, Demerol, Esgic [butalbital-apap-caffeine], and Iodine  Review of Systems   Review of Systems  All other systems reviewed and are negative.   Physical Exam Updated Vital Signs BP 119/70 (BP Location: Right Arm)   Pulse 85   Temp 98.2 F (36.8 C) (Oral)   Resp 18   Ht 5\' 1"  (1.549 m)   Wt 55.4 kg   SpO2 99%   BMI 23.08 kg/m   Physical Exam Vitals and nursing note reviewed.  Constitutional:      General: She is not in acute distress.    Appearance: She is well-developed.  HENT:     Head: Normocephalic and atraumatic.  Eyes:     Conjunctiva/sclera: Conjunctivae normal.  Cardiovascular:     Rate and Rhythm: Normal rate and regular rhythm.     Heart sounds: No murmur heard.   Pulmonary:     Effort: Pulmonary effort is normal. No respiratory distress.     Breath sounds: Normal breath sounds.  Abdominal:     Palpations: Abdomen is soft.     Tenderness: There is no abdominal tenderness.  Musculoskeletal:     Cervical back: Neck supple.  Skin:    General: Skin is warm and dry.  Neurological:     Mental Status: She is alert.     Comments: Ambulatory  Psychiatric:     Comments: Uncooperative     ED Results / Procedures / Treatments   Labs (all labs ordered are listed, but only abnormal results are displayed) Labs Reviewed  COMPREHENSIVE METABOLIC PANEL - Abnormal; Notable for the following components:      Result Value   Potassium 2.7 (*)    Glucose, Bld 123 (*)    Calcium 8.7 (*)    AST 86 (*)    GFR, Estimated 60 (*)    All other components within normal limits  ETHANOL - Abnormal; Notable for the  following components:   Alcohol, Ethyl (B) 367 (*)    All other components within normal limits  SALICYLATE LEVEL - Abnormal; Notable for the following components:   Salicylate Lvl <7.4 (*)    All other components within normal limits  ACETAMINOPHEN LEVEL - Abnormal; Notable for the following components:   Acetaminophen (Tylenol), Serum  <10 (*)    All other components within normal limits  CBC - Abnormal; Notable for the following components:   RDW 17.2 (*)    Platelets 443 (*)    All other components within normal limits  RESP PANEL BY RT-PCR (FLU A&B, COVID) ARPGX2  RAPID URINE DRUG SCREEN, HOSP PERFORMED    EKG None  Radiology No results found.  Procedures Procedures   Medications Ordered in ED Medications  potassium chloride SA (KLOR-CON) CR tablet 40 mEq (has no administration in time range)  haloperidol lactate (HALDOL) injection 5 mg (5 mg Intramuscular Given 11/20/20 2331)    ED Course  I have reviewed the triage vital signs and the nursing notes.  Pertinent labs & imaging results that were available during my care of the patient were reviewed by me and considered in my medical decision making (see chart for details).    MDM Rules/Calculators/A&P                          Patient here with suicidal thoughts and chronic pain.  States that she wants to die.  Also complains of back pain.  Ambulates without difficulty.  VSS.  K is low at 2.7, given supplements in ED.  Home meds ordered.  Placed on CIWA.  TTS consult pending.  Appears medically stable for TTS evaluation. Final Clinical Impression(s) / ED Diagnoses Final diagnoses:  Suicidal ideation  Alcoholic intoxication with complication [facility])    Rx / [place] Orders ED Discharge Orders    None       Montine Circle, PA-C 11/21/20 2595    Maudie Flakes, MD 11/21/20 [phone]

## 2020-11-22 DIAGNOSIS — R0600 Dyspnea, unspecified: Secondary | ICD-10-CM | POA: Diagnosis not present

## 2020-11-22 DIAGNOSIS — G4489 Other headache syndrome: Secondary | ICD-10-CM | POA: Diagnosis not present

## 2020-11-22 DIAGNOSIS — J189 Pneumonia, unspecified organism: Secondary | ICD-10-CM | POA: Diagnosis not present

## 2020-11-22 DIAGNOSIS — I10 Essential (primary) hypertension: Secondary | ICD-10-CM | POA: Diagnosis not present

## 2020-11-22 DIAGNOSIS — J9811 Atelectasis: Secondary | ICD-10-CM | POA: Diagnosis not present

## 2020-11-22 DIAGNOSIS — Z20822 Contact with and (suspected) exposure to covid-19: Secondary | ICD-10-CM | POA: Diagnosis not present

## 2020-11-22 DIAGNOSIS — R0902 Hypoxemia: Secondary | ICD-10-CM | POA: Diagnosis not present

## 2020-11-22 DIAGNOSIS — R069 Unspecified abnormalities of breathing: Secondary | ICD-10-CM | POA: Diagnosis not present

## 2020-11-22 DIAGNOSIS — R0602 Shortness of breath: Secondary | ICD-10-CM | POA: Diagnosis not present

## 2020-11-22 DIAGNOSIS — Z87891 Personal history of nicotine dependence: Secondary | ICD-10-CM | POA: Diagnosis not present

## 2020-11-23 DIAGNOSIS — D72829 Elevated white blood cell count, unspecified: Secondary | ICD-10-CM | POA: Insufficient documentation

## 2020-11-24 DIAGNOSIS — R29810 Facial weakness: Secondary | ICD-10-CM | POA: Diagnosis not present

## 2020-11-24 DIAGNOSIS — R410 Disorientation, unspecified: Secondary | ICD-10-CM | POA: Diagnosis not present

## 2020-11-24 DIAGNOSIS — I639 Cerebral infarction, unspecified: Secondary | ICD-10-CM | POA: Diagnosis not present

## 2020-11-26 DIAGNOSIS — I10 Essential (primary) hypertension: Secondary | ICD-10-CM | POA: Diagnosis not present

## 2020-11-26 DIAGNOSIS — J449 Chronic obstructive pulmonary disease, unspecified: Secondary | ICD-10-CM | POA: Diagnosis not present

## 2020-11-26 DIAGNOSIS — R0600 Dyspnea, unspecified: Secondary | ICD-10-CM | POA: Diagnosis not present

## 2020-11-26 DIAGNOSIS — J90 Pleural effusion, not elsewhere classified: Secondary | ICD-10-CM | POA: Diagnosis not present

## 2020-11-26 DIAGNOSIS — J9811 Atelectasis: Secondary | ICD-10-CM | POA: Diagnosis not present

## 2020-11-30 ENCOUNTER — Telehealth: Payer: Self-pay | Admitting: Family

## 2020-11-30 ENCOUNTER — Telehealth: Payer: Self-pay

## 2020-11-30 DIAGNOSIS — I10 Essential (primary) hypertension: Secondary | ICD-10-CM

## 2020-11-30 MED ORDER — AMLODIPINE-OLMESARTAN 10-40 MG PO TABS: 1.0000 | ORAL_TABLET | Freq: Every day | ORAL | 0 refills | Status: DC

## 2020-11-30 NOTE — Telephone Encounter (Signed)
We need to know name of medication.

## 2020-11-30 NOTE — Telephone Encounter (Signed)
  Prescription Request  11/30/2020  What is the name of the medication or equipment? Pt lost blood pressure meds on vacation  Have you contacted your pharmacy to request a refill? (if applicable) no  Which pharmacy would you like this sent to? [facility]   Patient notified that their request is being sent to the clinical staff for review and that they should receive a response within 2 business days.

## 2020-11-30 NOTE — Addendum Note (Signed)
Addended by: Thana Ates on: 11/30/2020 04:51 PM   Modules accepted: Orders

## 2020-11-30 NOTE — Telephone Encounter (Signed)
Transition Care Management Follow-up Telephone Call  Date of discharge and from where: [facility] 11/26/20  Diagnosis: Pneumonia  How have you been since you were released from the hospital? Okay, but has a bad cough and lost BP meds - hasn't taken any BP meds in 3-4 days and is getting very nervous  Any questions or concerns? No  Items Reviewed:  Did the pt receive and understand the discharge instructions provided? Yes   Medications obtained and verified? Yes   Other? No   Any new allergies since your discharge? No   Dietary orders reviewed? Yes  Do you have support at home? Yes   Home Care and Equipment/Supplies: Were home health services ordered? yes If so, what is the name of the agency? [facility]  Has the agency set up a time to come to the patient's home? yes Were any new equipment or medical supplies ordered?  No  Functional Questionnaire: (I = Independent and D = Dependent) ADLs: I  Bathing/Dressing- I  Meal Prep- I  Eating- I  Maintaining continence- I  Transferring/Ambulation- I  Managing Meds- I  Follow up appointments reviewed:   PCP Hospital f/u appt confirmed? No  She doesn't want to be seen again - says she was just seen last week and is tired  [place] [facility] f/u appt confirmed? No    Are transportation arrangements needed? No   If their condition worsens, is the pt aware to call PCP or go to the Emergency Dept.? Yes  Was the patient provided with contact information for the PCP's office or ED? Yes  Was to pt encouraged to call back with questions or concerns? Yes

## 2020-11-30 NOTE — Telephone Encounter (Signed)
Per patient she needs [place] resent to [facility]. Rx sent.

## 2020-12-14 ENCOUNTER — Telehealth: Payer: Self-pay | Admitting: Family

## 2020-12-14 ENCOUNTER — Ambulatory Visit (INDEPENDENT_AMBULATORY_CARE_PROVIDER_SITE_OTHER): Payer: Medicare Other | Admitting: Licensed Clinical Social Worker

## 2020-12-14 DIAGNOSIS — I10 Essential (primary) hypertension: Secondary | ICD-10-CM

## 2020-12-14 DIAGNOSIS — J441 Chronic obstructive pulmonary disease with (acute) exacerbation: Secondary | ICD-10-CM

## 2020-12-14 DIAGNOSIS — E785 Hyperlipidemia, unspecified: Secondary | ICD-10-CM

## 2020-12-14 DIAGNOSIS — E039 Hypothyroidism, unspecified: Secondary | ICD-10-CM

## 2020-12-14 DIAGNOSIS — G4700 Insomnia, unspecified: Secondary | ICD-10-CM

## 2020-12-14 DIAGNOSIS — F321 Major depressive disorder, single episode, moderate: Secondary | ICD-10-CM | POA: Diagnosis not present

## 2020-12-14 DIAGNOSIS — K219 Gastro-esophageal reflux disease without esophagitis: Secondary | ICD-10-CM

## 2020-12-14 DIAGNOSIS — F419 Anxiety disorder, unspecified: Secondary | ICD-10-CM

## 2020-12-14 NOTE — Chronic Care Management (AMB) (Signed)
Chronic Care Management    Clinical Social Work Note  12/14/2020 Name: Stacy Dalton MRN: [id number] DOB: 11/30/1950  Stacy Dalton is a 70 y.o. year old female who is a primary care patient of [name], FNP. The CCM team was consulted to assist the patient with chronic disease management and/or care coordination needs related to: [name] .   Engaged with patient/son of patient, Stacy Dalton,  by telephone for follow up visit in response to provider referral for social work chronic care management and care coordination services.   Consent to Services:  The patient was given information about Chronic Care Management services, agreed to services, and gave verbal consent prior to initiation of services.  Please see initial visit note for detailed documentation.   Patient agreed to services and consent obtained.   Assessment: Review of patient past medical history, allergies, medications, and health status, including review of relevant consultants reports was performed today as part of a comprehensive evaluation and provision of chronic care management and care coordination services.     SDOH (Social Determinants of Health) assessments and interventions performed:  SDOH Interventions    Flowsheet Row Most Recent Value  SDOH Interventions   Depression Interventions/Treatment  Medication        Advanced Directives Status: See Vynca application for related entries.  CCM Care Plan  Allergies  Allergen Reactions   Librium [Chlordiazepoxide]     Became unresponsive   Toradol [Ketorolac Tromethamine] Shortness Of Breath   Butalbital-Apap-Caffeine Other (See Comments)    jittery   Demerol Swelling   Esgic [Butalbital-Apap-Caffeine] Other (See Comments)    jittery   Iodine Other (See Comments)    bp bottomed out per pt several hours later; unsure if pre medicated in past with cm; done in [place]    Outpatient Encounter Medications as of 12/14/2020  Medication  Sig   amLODipine (NORVASC) 10 MG tablet Take 1 tablet by mouth daily.   amLODipine-olmesartan (AZOR) 10-40 MG tablet Take 1 tablet by mouth daily.   Aspirin-Caffeine (BC FAST PAIN RELIEF) 845-65 MG PACK Take 1 packet by mouth daily as needed (pain or headache).   chlordiazePOXIDE (LIBRIUM) 10 MG capsule Take by mouth.   DULoxetine (CYMBALTA) 60 MG capsule Take 1 capsule (60 mg total) by mouth daily.   fluticasone furoate-vilanterol (BREO ELLIPTA) 200-25 MCG/INH AEPB Inhale 1 puff into the lungs daily.   Fluticasone-Salmeterol (ADVAIR) 100-50 MCG/DOSE AEPB Inhale 1 puff into the lungs daily as needed (shortness of breath).   folic acid (FOLVITE) 1 MG tablet Take 1 tablet (1 mg total) by mouth daily.   hydrOXYzine (VISTARIL) 25 MG capsule Take 1 capsule (25 mg total) by mouth 3 (three) times daily as needed for anxiety.   levothyroxine (SYNTHROID) 50 MCG tablet Take 1 tablet (50 mcg total) by mouth daily before breakfast.   magnesium oxide (MAG-OX) 400 (241.3 Mg) MG tablet Take 1 tablet (400 mg total) by mouth daily.   pantoprazole (PROTONIX) 40 MG tablet Take 1 tablet (40 mg total) by mouth daily. (Patient not taking: No sig reported)   potassium chloride (KLOR-CON) 10 MEQ tablet Take 1 tablet (10 mEq total) by mouth daily.   thiamine 100 MG tablet Take 1 tablet (100 mg total) by mouth daily. (Patient not taking: No sig reported)   vitamin B-12 (CYANOCOBALAMIN) 1000 MCG tablet Take 1 tablet (1,000 mcg total) by mouth daily. (Patient not taking: No sig reported)   Vitamin D, Ergocalciferol, (DRISDOL) 1.25 MG (50000  UNIT) CAPS capsule Take 1 capsule (50,000 Units total) by mouth every 7 (seven) days. (Patient not taking: No sig reported)   No facility-administered encounter medications on file as of 12/14/2020.    Patient Active Problem List   Diagnosis Date Noted   Leukocytosis 11/23/2020   Alcohol use disorder, severe, dependence ([place]) 10/08/2020   Hyponatremia 09/17/2020   Alcohol withdrawal  ([place]) 09/12/2020   Chronic bronchitis ([place]) 08/20/2020   Moderate protein malnutrition ([place]) 08/20/2020   Chronic low back pain 08/19/2020   Elevated SGOT (AST) 08/19/2020   Elevated troponin 08/19/2020   Increased serum lipase level 08/19/2020   Alcohol abuse 06/21/2020   Multifocal pneumonia 09/19/2018   Large bowel obstruction (HCC)    Small bowel obstruction ([place]) 07/20/2018   Hypomagnesemia 07/20/2018   Cough 03/14/2018   Fatigue 03/14/2018   Polypharmacy 02/23/2017   Hypokalemia 67/61/9509   Toxic metabolic encephalopathy 32/67/1245   Anxiety 02/22/2017   GERD (gastroesophageal reflux disease) 02/22/2017   Pain medication agreement signed 10/10/2016   Opioid dependence ([place]) 10/10/2016   Status post colostomy takedown 04/12/2016   Chronic constipation 12/03/2015   Peritonitis with abscess of intestine ([place]) 11/29/2015   COPD exacerbation ([place]) 09/20/2015   Osteopenia 06/10/2014   Vitamin D deficiency 06/10/2014   Hypothyroidism    Depression 11/18/2012   Insomnia 11/18/2012   Chronic pain syndrome 11/18/2012   HLD (hyperlipidemia) 09/01/2012   S/P colostomy ([place]) 04/19/2012   Normocytic anemia 10/15/2011   Hypertension 10/13/2011   Chronic back pain 10/13/2011   COPD (chronic obstructive pulmonary disease) ([place]) 10/09/2011    Conditions to be addressed/monitored: Monitor client management of depression; monitor client management of alcohol use  Care Plan : LCSW Care Plan  Updates made by Katha Cabal, LCSW since 12/14/2020 12:00 AM     Problem: Emotional Distress      Goal: Emotional Health Supported: Manage depression; Manage Alcohol Usage   Start Date: 12/14/2020  Expected End Date: 03/16/2021  This Visit's Progress: Not on track  Recent Progress: Not on track  Priority: High  Note:   Current Barriers:  Chronic Mental Health needs related to management of anxiety and stress issues Suicidal Ideation/Homicidal Ideation: No Hx of Alcohol  Use/Abuse  Clinical Social Work Goal(s):  patient will work with SW monthly by telephone or in person to reduce or manage symptoms related to anxiety and stress issues faced Client will call LCSW in next 30 days as needed to discuss LCSW support with CCM program  Interventions: 1:1 collaboration with  [name], FNP regarding development and update of comprehensive plan of care as evidenced by provider attestation and co-signature Talked with Stacy Dalton, son of client,about client needs Talked with Stacy Dalton about client use of alcohol Stacy Dalton said that client often signs herself out of treatment before treatment for alcohol use can be completed. He said he knows that client hides alcohol in her home. He said she sometimes drives to business and picks up alcohol to drink. He said she sometimes has a friend who obtains alcohol for her. He was also concerned about her possible obtaining of medications from others. He was concerned about possibility of her mixing medications with alcohol.  He said she recently received DUI and he hopes judge will require her to go through a Substance Abuse Treatment program to try to help her.  He said she still has her license at present) Talked with Stacy Dalton about client use of pain medications in the past Talked with Stacy Dalton  about sleeping issues of client Talked with Stacy Dalton about anxiety and stress issues of client Talked with Stacy Dalton about driving of client Stacy Dalton said that client recently received a DUI) Stacy Dalton said client often drives in local area) Talked with Stacy Dalton about appetite  of client  Talked with Stacy Dalton about weight of client Stacy Dalton said he thought client weighed about 90 pounds; he said she is not eating well) Talked with Stacy Dalton about her upcoming medical appointments Talked with Stacy Dalton about mood of client (depression, anxiety) Talked with Stacy Dalton about family support Stacy Dalton, son,is supportive; Stacy Dalton also has a brother who is supportive of client)  Patient Self Care  Activities:  Takes medications as prescribed  Patient Coping Strengths:  Has family support from her son, Stacy Dalton  Patient Self Care Deficits:  Hx Alcohol Use/Abuse  Patient Goals:  - spend time or talk with others every day - practice relaxation or meditation daily - keep a calendar with appointment dates  Follow Up Plan: LCSW to call client or son, Stacy Dalton on 01/26/21      Norva Riffle.Mikele Sifuentes MSW, LCSW Licensed Clinical Social Worker [facility] Care Management [phone]

## 2020-12-14 NOTE — Patient Instructions (Signed)
Visit Information  PATIENT GOALS:  Goals Addressed             This Visit's Progress    Manage Anxiety and Stress issues faced       Timeframe:  Short-Term Goal Priority:  High Progress: Not On Track Start Date:      12/14/20                       Expected End Date:    03/16/21                Follow Up Date 01/26/21   Manage My Emotions (Patient) Manage Anxiety and Stress Symptoms Faced    Why is this important?   When you are stressed, down or upset, your body reacts too.  For example, your blood pressure may get higher; you may have a headache or stomachache.  When your emotions get the best of you, your body's ability to fight off cold and flu gets weak.  These steps will help you manage your emotions.     Patient Self Care Activities:  Takes medications as prescribed  Patient Coping Strengths:  Has some family support from her son, [name]  Patient Self Care Deficits:  Hx Alcohol Use/Abuse  Patient Goals:  - spend time or talk with others every day - practice relaxation or meditation daily - keep a calendar with appointment dates  Follow Up Plan: LCSW to call client or son, [name], on 01/26/21            Norva Riffle.Shikha Bibb MSW, LCSW Licensed Clinical Social Worker [facility] Care Management [phone]

## 2020-12-14 NOTE — Telephone Encounter (Signed)
Appt made with on call provider for 06/15 [place]

## 2020-12-15 ENCOUNTER — Ambulatory Visit: Payer: Medicare Other | Admitting: Family Medicine

## 2020-12-17 ENCOUNTER — Encounter: Payer: Self-pay | Admitting: Family

## 2020-12-17 ENCOUNTER — Telehealth: Payer: Self-pay | Admitting: Family Medicine

## 2020-12-17 NOTE — Telephone Encounter (Signed)
VMb not set up

## 2020-12-17 NOTE — Telephone Encounter (Signed)
Caller states she has no BP medication left because fiance stole them after breaking up and she needs it refilled.BP has been elevated unsure of reading.

## 2020-12-20 ENCOUNTER — Ambulatory Visit (INDEPENDENT_AMBULATORY_CARE_PROVIDER_SITE_OTHER): Payer: Medicare Other

## 2020-12-20 ENCOUNTER — Other Ambulatory Visit: Payer: Self-pay

## 2020-12-20 DIAGNOSIS — F1010 Alcohol abuse, uncomplicated: Secondary | ICD-10-CM

## 2020-12-20 DIAGNOSIS — D72829 Elevated white blood cell count, unspecified: Secondary | ICD-10-CM

## 2020-12-20 DIAGNOSIS — R29810 Facial weakness: Secondary | ICD-10-CM

## 2020-12-20 DIAGNOSIS — G894 Chronic pain syndrome: Secondary | ICD-10-CM | POA: Diagnosis not present

## 2020-12-20 DIAGNOSIS — J449 Chronic obstructive pulmonary disease, unspecified: Secondary | ICD-10-CM

## 2020-12-20 DIAGNOSIS — M549 Dorsalgia, unspecified: Secondary | ICD-10-CM

## 2020-12-20 DIAGNOSIS — I10 Essential (primary) hypertension: Secondary | ICD-10-CM

## 2020-12-20 DIAGNOSIS — Z8701 Personal history of pneumonia (recurrent): Secondary | ICD-10-CM

## 2020-12-20 DIAGNOSIS — Z87891 Personal history of nicotine dependence: Secondary | ICD-10-CM

## 2020-12-30 DIAGNOSIS — R1111 Vomiting without nausea: Secondary | ICD-10-CM | POA: Diagnosis not present

## 2020-12-30 DIAGNOSIS — R112 Nausea with vomiting, unspecified: Secondary | ICD-10-CM | POA: Diagnosis not present

## 2020-12-30 DIAGNOSIS — R110 Nausea: Secondary | ICD-10-CM | POA: Diagnosis not present

## 2021-01-18 ENCOUNTER — Emergency Department (HOSPITAL_COMMUNITY)
Admission: EM | Admit: 2021-01-18 | Discharge: 2021-01-19 | Disposition: A | Discharge status: Home / Self Care | Payer: Medicare Other | Attending: Emergency Medicine | Admitting: Emergency Medicine

## 2021-01-18 ENCOUNTER — Other Ambulatory Visit: Payer: Self-pay

## 2021-01-18 ENCOUNTER — Emergency Department (HOSPITAL_COMMUNITY): Payer: Medicare Other

## 2021-01-18 ENCOUNTER — Encounter (HOSPITAL_COMMUNITY): Payer: Self-pay | Admitting: Emergency Medicine

## 2021-01-18 DIAGNOSIS — Z87891 Personal history of nicotine dependence: Secondary | ICD-10-CM | POA: Insufficient documentation

## 2021-01-18 DIAGNOSIS — J441 Chronic obstructive pulmonary disease with (acute) exacerbation: Secondary | ICD-10-CM | POA: Diagnosis not present

## 2021-01-18 DIAGNOSIS — I10 Essential (primary) hypertension: Secondary | ICD-10-CM | POA: Diagnosis not present

## 2021-01-18 DIAGNOSIS — R4182 Altered mental status, unspecified: Secondary | ICD-10-CM | POA: Insufficient documentation

## 2021-01-18 DIAGNOSIS — F10129 Alcohol abuse with intoxication, unspecified: Secondary | ICD-10-CM | POA: Insufficient documentation

## 2021-01-18 DIAGNOSIS — E039 Hypothyroidism, unspecified: Secondary | ICD-10-CM | POA: Diagnosis not present

## 2021-01-18 DIAGNOSIS — Z79899 Other long term (current) drug therapy: Secondary | ICD-10-CM | POA: Diagnosis not present

## 2021-01-18 DIAGNOSIS — Y908 Blood alcohol level of 240 mg/100 ml or more: Secondary | ICD-10-CM | POA: Diagnosis not present

## 2021-01-18 DIAGNOSIS — Z7951 Long term (current) use of inhaled steroids: Secondary | ICD-10-CM | POA: Diagnosis not present

## 2021-01-18 DIAGNOSIS — F10221 Alcohol dependence with intoxication delirium: Secondary | ICD-10-CM

## 2021-01-18 DIAGNOSIS — F10920 Alcohol use, unspecified with intoxication, uncomplicated: Secondary | ICD-10-CM

## 2021-01-18 HISTORY — DX: Alcohol abuse, uncomplicated: F10.10

## 2021-01-18 LAB — COMPREHENSIVE METABOLIC PANEL
ALT: 28 U/L (ref 0–44)
AST: 50 U/L — ABNORMAL HIGH (ref 15–41)
Albumin: 3.9 g/dL (ref 3.5–5.0)
Alkaline Phosphatase: 70 U/L (ref 38–126)
Anion gap: 14 (ref 5–15)
BUN: 18 mg/dL (ref 8–23)
CO2: 25 mmol/L (ref 22–32)
Calcium: 8.8 mg/dL — ABNORMAL LOW (ref 8.9–10.3)
Chloride: 99 mmol/L (ref 98–111)
Creatinine, Ser: 0.72 mg/dL (ref 0.44–1.00)
GFR, Estimated: >60 mL/min (ref >60)
Glucose, Bld: 81 mg/dL (ref 70–99)
Potassium: 3.5 mmol/L (ref 3.5–5.1)
Sodium: 138 mmol/L (ref 135–145)
Total Bilirubin: 0.5 mg/dL (ref 0.3–1.2)
Total Protein: 6.5 g/dL (ref 6.5–8.1)

## 2021-01-18 LAB — CBC WITH DIFFERENTIAL/PLATELET
Abs Immature Granulocytes: 0.02 10*3/uL (ref 0.00–0.07)
Basophils Absolute: 0 10*3/uL (ref 0.0–0.1)
Basophils Relative: 1 %
Eosinophils Absolute: 0.1 10*3/uL (ref 0.0–0.5)
Eosinophils Relative: 3 %
HCT: 30.9 % — ABNORMAL LOW (ref 36.0–46.0)
Hemoglobin: 10 g/dL — ABNORMAL LOW (ref 12.0–15.0)
Immature Granulocytes: 1 %
Lymphocytes Relative: 38 %
Lymphs Abs: 1.7 10*3/uL (ref 0.7–4.0)
MCH: 26.4 pg (ref 26.0–34.0)
MCHC: 32.4 g/dL (ref 30.0–36.0)
MCV: 81.5 fL (ref 80.0–100.0)
Monocytes Absolute: 0.4 10*3/uL (ref 0.1–1.0)
Monocytes Relative: 9 %
Neutro Abs: 2.2 10*3/uL (ref 1.7–7.7)
Neutrophils Relative %: 48 %
Platelets: 315 10*3/uL (ref 150–400)
RBC: 3.79 MIL/uL — ABNORMAL LOW (ref 3.87–5.11)
RDW: 20.7 % — ABNORMAL HIGH (ref 11.5–15.5)
WBC: 4.4 10*3/uL (ref 4.0–10.5)
nRBC: 0 % (ref 0.0–0.2)

## 2021-01-18 LAB — ETHANOL: Alcohol, Ethyl (B): 374 mg/dL (ref <10)

## 2021-01-18 LAB — SALICYLATE LEVEL: Salicylate Lvl: <7 mg/dL — ABNORMAL LOW (ref 7.0–30.0)

## 2021-01-18 LAB — MAGNESIUM: Magnesium: 1.8 mg/dL (ref 1.7–2.4)

## 2021-01-18 LAB — ACETAMINOPHEN LEVEL: Acetaminophen (Tylenol), Serum: <10 ug/mL — ABNORMAL LOW (ref 10–30)

## 2021-01-18 NOTE — ED Notes (Signed)
Pt not cooperating with VS. Will try to obtain later.

## 2021-01-18 NOTE — ED Notes (Signed)
Pt ambulated to bathroom with steady gate at this time.

## 2021-01-18 NOTE — ED Triage Notes (Signed)
Pts family called EMS due to pt being intoxicated. Pt found to have 1/2 Gallon liquor under her bed at home.

## 2021-01-18 NOTE — ED Provider Notes (Signed)
[facility] EMERGENCY DEPARTMENT Provider Note   CSN: [id number] Arrival date & time: 01/18/21  1734     History Chief Complaint  Patient presents with   Alcohol Intoxication    Stacy Dalton is a 70 y.o. female who presents after alcohol intoxication.  History is provided by patient.  She states she was brought here by EMS that her daughter called.  States she thinks she drank too much. Endorses drinking vodka but unsure how much.  Unsure of the time of her last drink. States she drinks a few shots a day. States she started drinking this year because of her back pain. Denies nausea, vomiting, states head hurts a little, denies stomach pain. States she gets very shaky if she doesn't drink. Endorses wanting to stop drinking but has been unsuccessful.  She states she tried going to rehab facility.  But was unable to attend because she got a fever and had to be transferred to the hospital.  States she is not taking any medication because "they took my medicine." When asked who took it she says " well, those people."    Past Medical History:  Diagnosis Date   Chronic pain    COPD (chronic obstructive pulmonary disease) ([place])    told has copd, no current inhaler use   Cough    Depression    ETOH abuse    GERD (gastroesophageal reflux disease)    Hypertension    Hypothyroidism    Insomnia    Migraine    Migraine    Osteopenia    Pain management    Panic attacks    Small bowel obstruction ([place])     Patient Active Problem List   Diagnosis Date Noted   Leukocytosis 11/23/2020   Alcohol use disorder, severe, dependence ([place]) 10/08/2020   Hyponatremia 09/17/2020   Alcohol withdrawal ([place]) 09/12/2020   Chronic bronchitis ([place]) 08/20/2020   Moderate protein malnutrition ([place]) 08/20/2020   Chronic low back pain 08/19/2020   Elevated SGOT (AST) 08/19/2020   Elevated troponin 08/19/2020   Increased serum lipase level 08/19/2020   Alcohol abuse 06/21/2020   Multifocal pneumonia  09/19/2018   Large bowel obstruction (HCC)    Small bowel obstruction ([place]) 07/20/2018   Hypomagnesemia 07/20/2018   Cough 03/14/2018   Fatigue 03/14/2018   Polypharmacy 02/23/2017   Hypokalemia 22/97/9892   Toxic metabolic encephalopathy 11/94/1740   Anxiety 02/22/2017   GERD (gastroesophageal reflux disease) 02/22/2017   Pain medication agreement signed 10/10/2016   Opioid dependence ([place]) 10/10/2016   Status post colostomy takedown 04/12/2016   Chronic constipation 12/03/2015   Peritonitis with abscess of intestine ([place]) 11/29/2015   COPD exacerbation ([place]) 09/20/2015   Osteopenia 06/10/2014   Vitamin D deficiency 06/10/2014   Hypothyroidism    Depression 11/18/2012   Insomnia 11/18/2012   Chronic pain syndrome 11/18/2012   HLD (hyperlipidemia) 09/01/2012   S/P colostomy ([place]) 04/19/2012   Normocytic anemia 10/15/2011   Hypertension 10/13/2011   Chronic back pain 10/13/2011   COPD (chronic obstructive pulmonary disease) ([place]) 10/09/2011    Past Surgical History:  Procedure Laterality Date   ABDOMINAL HYSTERECTOMY     COLON SURGERY     COLOSTOMY CLOSURE  04/19/2012   Procedure: COLOSTOMY CLOSURE;  Surgeon: Adin Hector, MD;  Location: [facility];  Service: General;  Laterality: N/A;  Laparotomy, Resection and Closure of Colostomy   COLOSTOMY TAKEDOWN N/A 04/12/2016   Procedure: [place] TAKEDOWN;  Surgeon: Johnathan Hausen, MD;  Location: [facility];  Service:  General;  Laterality: N/A;   INCONTINENCE SURGERY     LAPAROTOMY  10/04/2011, colostomy also   Procedure: EXPLORATORY LAPAROTOMY;  Surgeon: Adin Hector, MD;  Location: [facility];  Service: General;  Laterality: N/A;  left partial colectomy with colostomy   LAPAROTOMY  04/19/2012   Procedure: EXPLORATORY LAPAROTOMY;  Surgeon: Adin Hector, MD;  Location: [facility];  Service: General;  Laterality: N/A;   LAPAROTOMY N/A 11/29/2015   Procedure: EXPLORATORY LAPAROTOMY; SUBTOTAL COLECTOMY WITH HARTMAN PROCEDURE AND END  COLOSTOMY;  Surgeon: Johnathan Hausen, MD;  Location: [facility];  Service: General;  Laterality: N/A;   TUBAL LIGATION     VENTRAL HERNIA REPAIR  04/19/2012   Procedure: HERNIA REPAIR VENTRAL ADULT;  Surgeon: Adin Hector, MD;  Location: [facility];  Service: General;  Laterality: N/A;     OB History     Gravida      Para      Term      Preterm      AB      Living  5      SAB      IAB      Ectopic      Multiple      Live Births              Family History  Problem Relation Age of Onset   Heart disease Mother    Hypertension Mother    Cancer Sister        sinus   Diabetes Brother    Heart disease Brother    Thyroid disease Brother    Cancer Sister        abdominal ?   Thyroid disease Sister    Cancer Other        GE junction adenocarcinoma   Emphysema Father    Stroke Father    Hypertension Father    Heart disease Father    COPD Sister    Thyroid disease Sister    Thyroid disease Sister    Diabetes Brother    Thyroid disease Brother    Thyroid disease Brother    Hypertension Brother    Post-traumatic stress disorder Brother    COPD Brother    Thyroid disease Brother    Thyroid disease Brother    Hypertension Brother    Transient ischemic attack Brother     Social History   Tobacco Use   Smoking status: Former    Packs/day: 1.50    Years: 20.00    Pack years: 30.00    Types: Cigarettes    Quit date: 10/04/1998    Years since quitting: 22.3   Smokeless tobacco: Never  Vaping Use   Vaping Use: Never used  Substance Use Topics   Alcohol use: Yes    Comment: 1bottle vodka -3 days    Drug use: No    Home Medications Prior to Admission medications   Medication Sig Start Date End Date Taking? Authorizing Provider  amLODipine-olmesartan (AZOR) 10-40 MG tablet Take 1 tablet by mouth daily. 11/30/20   Evelina Dun A, FNP  Aspirin-Caffeine (BC FAST PAIN RELIEF) 845-65 MG PACK Take 1 packet by mouth daily as needed (pain or headache).     [provider]  chlordiazePOXIDE (LIBRIUM) 10 MG capsule Take by mouth. 11/26/20   [provider]  DULoxetine (CYMBALTA) 60 MG capsule Take 1 capsule (60 mg total) by mouth daily. 04/27/20   Evelina Dun A, FNP  fluticasone furoate-vilanterol (BREO ELLIPTA) 200-25 MCG/INH AEPB  Inhale 1 puff into the lungs daily. 01/16/20   Evelina Dun A, FNP  Fluticasone-Salmeterol (ADVAIR) 100-50 MCG/DOSE AEPB Inhale 1 puff into the lungs daily as needed (shortness of breath). 02/09/20   [provider]  folic acid (FOLVITE) 1 MG tablet Take 1 tablet (1 mg total) by mouth daily. 08/11/20   Wyvonnia Dusky, MD  hydrOXYzine (VISTARIL) 25 MG capsule Take 1 capsule (25 mg total) by mouth 3 (three) times daily as needed for anxiety. 08/27/20   [name], FNP  levothyroxine (SYNTHROID) 50 MCG tablet Take 1 tablet (50 mcg total) by mouth daily before breakfast. 06/22/20   Evelina Dun A, FNP  magnesium oxide (MAG-OX) 400 (241.3 Mg) MG tablet Take 1 tablet (400 mg total) by mouth daily. 10/12/20   Lorella Nimrod, MD  pantoprazole (PROTONIX) 40 MG tablet Take 1 tablet (40 mg total) by mouth daily. Patient not taking: No sig reported 10/12/20   Lorella Nimrod, MD  potassium chloride (KLOR-CON) 10 MEQ tablet Take 1 tablet (10 mEq total) by mouth daily. 11/21/20 12/21/20  Wyvonnia Dusky, MD  thiamine 100 MG tablet Take 1 tablet (100 mg total) by mouth daily. Patient not taking: No sig reported 10/12/20   Lorella Nimrod, MD  vitamin B-12 (CYANOCOBALAMIN) 1000 MCG tablet Take 1 tablet (1,000 mcg total) by mouth daily. Patient not taking: No sig reported 10/12/20   Lorella Nimrod, MD  Vitamin D, Ergocalciferol, (DRISDOL) 1.25 MG (50000 UNIT) CAPS capsule Take 1 capsule (50,000 Units total) by mouth every 7 (seven) days. Patient not taking: No sig reported 06/22/20   [name], FNP    Allergies    Librium [chlordiazepoxide], Toradol [ketorolac tromethamine], Butalbital-apap-caffeine,  Demerol, Esgic [butalbital-apap-caffeine], and Iodine  Review of Systems   Review of Systems  All other systems reviewed and are negative.  Physical Exam Updated Vital Signs BP (!) 152/81   Pulse 87   Temp 98.2 F (36.8 C)   Resp 19   Ht 5\' 1"  (1.549 m)   Wt 55.4 kg   SpO2 100%   BMI 23.08 kg/m   Physical Exam Constitutional:      General: She is not in acute distress. HENT:     Head: Normocephalic and atraumatic.  Eyes:     Extraocular Movements: Extraocular movements intact.     Pupils: Pupils are equal, round, and reactive to light.  Cardiovascular:     Rate and Rhythm: Normal rate and regular rhythm.  Pulmonary:     Effort: Pulmonary effort is normal.     Breath sounds: Normal breath sounds.  Abdominal:     General: There is no distension.     Palpations: Abdomen is soft.     Tenderness: There is no abdominal tenderness.  Musculoskeletal:     Cervical back: Normal range of motion and neck supple.  Skin:    General: Skin is warm and dry.  Neurological:     Mental Status: She is oriented to person, place, and time.    ED Results / Procedures / Treatments   Labs (all labs ordered are listed, but only abnormal results are displayed) Labs Reviewed  COMPREHENSIVE METABOLIC PANEL - Abnormal; Notable for the following components:      Result Value   Calcium 8.8 (*)    AST 50 (*)    All other components within normal limits  CBC WITH DIFFERENTIAL/PLATELET - Abnormal; Notable for the following components:   RBC 3.79 (*)    Hemoglobin 10.0 (*)  HCT 30.9 (*)    RDW 20.7 (*)    All other components within normal limits  ETHANOL - Abnormal; Notable for the following components:   Alcohol, Ethyl (B) 374 (*)    All other components within normal limits  SALICYLATE LEVEL - Abnormal; Notable for the following components:   Salicylate Lvl <4.1 (*)    All other components within normal limits  ACETAMINOPHEN LEVEL - Abnormal; Notable for the following components:    Acetaminophen (Tylenol), Serum <10 (*)    All other components within normal limits  MAGNESIUM    EKG None  Radiology CT Head Wo Contrast  Result Date: 01/18/2021 CLINICAL DATA:  Altered mental status, intoxication EXAM: CT HEAD WITHOUT CONTRAST TECHNIQUE: Contiguous axial images were obtained from the base of the skull through the vertex without intravenous contrast. COMPARISON:  11/24/2020 FINDINGS: Brain: Normal anatomic configuration. No abnormal intra or extra-axial mass lesion or fluid collection. No abnormal mass effect or midline shift. No evidence of acute intracranial hemorrhage or infarct. Ventricular size is normal. Cerebellum unremarkable. Vascular: Unremarkable Skull: Intact Sinuses/Orbits: Paranasal sinuses are clear. Orbits are unremarkable. Other: Mastoid air cells and middle ear cavities are clear. There is soft tissue swelling superficial to the left supraorbital ridge and left frontal bone. IMPRESSION: No acute intracranial abnormality. No calvarial fracture. Left frontal soft tissue swelling. Electronically Signed   By: Fidela Salisbury MD   On: 01/18/2021 20:53    Procedures Procedures   Medications Ordered in ED Medications - No data to display  ED Course  I have reviewed the triage vital signs and the nursing notes.  Pertinent labs & imaging results that were available during my care of the patient were reviewed by me and considered in my medical decision making (see chart for details).    MDM Rules/Calculators/A&P                           Ms Whitmire is a 70 yo with a history of alcohol dependence who presents for acute alcohol intoxication. Interested in quitting and has tried to in the past and has been unsuccessful.  On exam BP 152/81. Neuro exam normal. Pupils equal and reactive to light. No tremors or signs of withdrawal.   Ethanol level 374 AST only mildly elevated at 50, ALT 28. Hgb 10  Normal acetaminophen and salicylate levels  Signed out to night  ED provider   Final Clinical Impression(s) / ED Diagnoses Final diagnoses:  None    Rx / DC Orders ED Discharge Orders     None        Shary Key, DO 01/18/21 2358    Elnora Morrison, MD 01/19/21 [phone]

## 2021-01-19 DIAGNOSIS — F10129 Alcohol abuse with intoxication, unspecified: Secondary | ICD-10-CM | POA: Diagnosis not present

## 2021-01-19 MED ORDER — LORAZEPAM 1 MG PO TABS
1.0000 mg | ORAL_TABLET | Freq: Once | ORAL | Status: AC
  Administered 2021-01-19: 1 mg via ORAL
  Filled 2021-01-19: qty 1

## 2021-01-19 NOTE — ED Notes (Signed)
Pt resting at this time. Will obtain vitals when pt wakes.

## 2021-01-19 NOTE — Discharge Instructions (Signed)
Avoid all forms of alcohol.  Use the attached resource guide to help find treatment for alcohol dependence.

## 2021-01-19 NOTE — ED Notes (Signed)
No answers from family

## 2021-01-19 NOTE — ED Notes (Signed)
Pt asleep.

## 2021-01-19 NOTE — ED Notes (Signed)
Talked to son , tom. He said he is truck Geophysicist/field seismologist. He will try to get someone to come and pick her up

## 2021-01-26 ENCOUNTER — Ambulatory Visit (INDEPENDENT_AMBULATORY_CARE_PROVIDER_SITE_OTHER): Payer: Medicare Other | Admitting: Licensed Clinical Social Worker

## 2021-01-26 DIAGNOSIS — J441 Chronic obstructive pulmonary disease with (acute) exacerbation: Secondary | ICD-10-CM | POA: Diagnosis not present

## 2021-01-26 DIAGNOSIS — F419 Anxiety disorder, unspecified: Secondary | ICD-10-CM

## 2021-01-26 DIAGNOSIS — E785 Hyperlipidemia, unspecified: Secondary | ICD-10-CM | POA: Diagnosis not present

## 2021-01-26 DIAGNOSIS — F321 Major depressive disorder, single episode, moderate: Secondary | ICD-10-CM | POA: Diagnosis not present

## 2021-01-26 DIAGNOSIS — G4700 Insomnia, unspecified: Secondary | ICD-10-CM

## 2021-01-26 DIAGNOSIS — E039 Hypothyroidism, unspecified: Secondary | ICD-10-CM | POA: Diagnosis not present

## 2021-01-26 DIAGNOSIS — I10 Essential (primary) hypertension: Secondary | ICD-10-CM | POA: Diagnosis not present

## 2021-01-26 DIAGNOSIS — K219 Gastro-esophageal reflux disease without esophagitis: Secondary | ICD-10-CM

## 2021-01-26 NOTE — Patient Instructions (Signed)
Visit Information  PATIENT GOALS:  Goals Addressed             This Visit's Progress    Manage Anxiety and Stress issues faced       Timeframe:  Short-Term Goal Priority:  High Progress: Not On Track Start Date:      01/26/21                       Expected End Date:    04/19/21                Follow Up Date 03/04/21   Manage My Emotions (Patient) Manage Anxiety and Stress Symptoms Faced    Why is this important?   When you are stressed, down or upset, your body reacts too.  For example, your blood pressure may get higher; you may have a headache or stomachache.  When your emotions get the best of you, your body's ability to fight off cold and flu gets weak.  These steps will help you manage your emotions.     Patient Self Care Activities:  Takes medications as prescribed  Patient Coping Strengths:  Has some family support from her son, [name]  Patient Self Care Deficits:  Hx Alcohol Use/Abuse  Patient Goals:  - spend time or talk with others every day - practice relaxation or meditation daily - keep a calendar with appointment dates  Follow Up Plan: LCSW to call client or son, [name], on 03/04/21    Norva Riffle.Felisha Claytor MSW, LCSW Licensed Clinical Social Worker [facility] Care Management [phone]

## 2021-01-26 NOTE — Chronic Care Management (AMB) (Signed)
Chronic Care Management    Clinical Social Work Note  01/26/2021 Name: Stacy Dalton MRN: [id number] DOB: September 09, 1950  Stacy Dalton is a 70 y.o. year old female who is a primary care patient of Stacy Balloon, FNP. The CCM team was consulted to assist the patient with chronic disease management and/or care coordination needs related to: [name] .   Engaged with patient by telephone for follow up visit in response to provider referral for social work chronic care management and care coordination services.   Consent to Services:  The patient was given information about Chronic Care Management services, agreed to services, and gave verbal consent prior to initiation of services.  Please see initial visit note for detailed documentation.   Patient agreed to services and consent obtained.   Assessment: Review of patient past medical history, allergies, medications, and health status, including review of relevant consultants reports was performed today as part of a comprehensive evaluation and provision of chronic care management and care coordination services.     SDOH (Social Determinants of Health) assessments and interventions performed:  SDOH Interventions    Flowsheet Row Most Recent Value  SDOH Interventions   Depression Interventions/Treatment  Medication        Advanced Directives Status: See Vynca application for related entries.  CCM Care Plan  Allergies  Allergen Reactions   Librium [Chlordiazepoxide]     Became unresponsive   Toradol [Ketorolac Tromethamine] Shortness Of Breath   Butalbital-Apap-Caffeine Other (See Comments)    jittery   Demerol Swelling   Esgic [Butalbital-Apap-Caffeine] Other (See Comments)    jittery   Iodine Other (See Comments)    bp bottomed out per pt several hours later; unsure if pre medicated in past with cm; done in [place]    Outpatient Encounter Medications as of 01/26/2021  Medication Sig   amLODipine-olmesartan  (AZOR) 10-40 MG tablet Take 1 tablet by mouth daily.   Aspirin-Caffeine (BC FAST PAIN RELIEF) 845-65 MG PACK Take 1 packet by mouth daily as needed (pain or headache).   chlordiazePOXIDE (LIBRIUM) 10 MG capsule Take by mouth.   DULoxetine (CYMBALTA) 60 MG capsule Take 1 capsule (60 mg total) by mouth daily.   fluticasone furoate-vilanterol (BREO ELLIPTA) 200-25 MCG/INH AEPB Inhale 1 puff into the lungs daily.   Fluticasone-Salmeterol (ADVAIR) 100-50 MCG/DOSE AEPB Inhale 1 puff into the lungs daily as needed (shortness of breath).   folic acid (FOLVITE) 1 MG tablet Take 1 tablet (1 mg total) by mouth daily.   hydrOXYzine (VISTARIL) 25 MG capsule Take 1 capsule (25 mg total) by mouth 3 (three) times daily as needed for anxiety.   levothyroxine (SYNTHROID) 50 MCG tablet Take 1 tablet (50 mcg total) by mouth daily before breakfast.   magnesium oxide (MAG-OX) 400 (241.3 Mg) MG tablet Take 1 tablet (400 mg total) by mouth daily.   pantoprazole (PROTONIX) 40 MG tablet Take 1 tablet (40 mg total) by mouth daily. (Patient not taking: No sig reported)   potassium chloride (KLOR-CON) 10 MEQ tablet Take 1 tablet (10 mEq total) by mouth daily.   thiamine 100 MG tablet Take 1 tablet (100 mg total) by mouth daily. (Patient not taking: No sig reported)   vitamin B-12 (CYANOCOBALAMIN) 1000 MCG tablet Take 1 tablet (1,000 mcg total) by mouth daily. (Patient not taking: No sig reported)   Vitamin D, Ergocalciferol, (DRISDOL) 1.25 MG (50000 UNIT) CAPS capsule Take 1 capsule (50,000 Units total) by mouth every 7 (seven) days. (Patient not taking:  No sig reported)   No facility-administered encounter medications on file as of 01/26/2021.    Patient Active Problem List   Diagnosis Date Noted   Leukocytosis 11/23/2020   Alcohol use disorder, severe, dependence ([place]) 10/08/2020   Hyponatremia 09/17/2020   Alcohol withdrawal ([place]) 09/12/2020   Chronic bronchitis ([place]) 08/20/2020   Moderate protein malnutrition ([place])  08/20/2020   Chronic low back pain 08/19/2020   Elevated SGOT ([facility]) 08/19/2020   Elevated troponin 08/19/2020   Increased serum lipase level 08/19/2020   Alcohol abuse 06/21/2020   Multifocal pneumonia 09/19/2018   Large bowel obstruction (HCC)    Small bowel obstruction ([place]) 07/20/2018   Hypomagnesemia 07/20/2018   Cough 03/14/2018   Fatigue 03/14/2018   Polypharmacy 02/23/2017   Hypokalemia [id number]   Toxic metabolic encephalopathy [id number]   Anxiety 02/22/2017   GERD (gastroesophageal reflux disease) 02/22/2017   Pain medication agreement signed 10/10/2016   Opioid dependence ([place]) 10/10/2016   Status post colostomy takedown 04/12/2016   Chronic constipation 12/03/2015   Peritonitis with abscess of intestine ([place]) 11/29/2015   COPD exacerbation ([place]) 09/20/2015   Osteopenia 06/10/2014   Vitamin D deficiency 06/10/2014   Hypothyroidism    Depression 11/18/2012   Insomnia 11/18/2012   Chronic pain syndrome 11/18/2012   HLD (hyperlipidemia) 09/01/2012   S/P colostomy ([place]) 04/19/2012   Normocytic anemia 10/15/2011   Hypertension 10/13/2011   Chronic back pain 10/13/2011   COPD (chronic obstructive pulmonary disease) ([place]) 10/09/2011    Conditions to be addressed/monitored: monitor client management of anxiety and stress issues   Care Plan : LCSW Care Plan  Updates made by Stacy Cabal, LCSW since 01/26/2021 12:00 AM     Problem: Emotional Distress      Goal: Emotional Health Supported: Manage depression; Manage Alcohol Usage   Start Date: 01/26/2021  Expected End Date: 04/19/2021  This Visit's Progress: Not on track  Recent Progress: Not on track  Priority: High  Note:   Current Barriers:  Chronic Mental Health needs related to management of anxiety and stress issues Suicidal Ideation/Homicidal Ideation: No Hx of Alcohol Use/Abuse  Clinical Social Work Goal(s):  patient will work with SW monthly by telephone or in person to reduce or manage symptoms  related to anxiety and stress issues faced Client will call LCSW in next 30 days as needed to discuss LCSW support with CCM program  Interventions: 1:1 collaboration with  Stacy Balloon, FNP regarding development and update of comprehensive plan of care as evidenced by provider attestation and co-signature Talked with client about client needs Client was crying and saying she needed help.  LCSW asked client if she had been drinking today and client said that she had been drinking greater than 2 drinks today LCSW talked with client about appetite (she spoke of reduced appetite) LCSW talked with client about sleeping issues (client said she is not sleeping well) LCSW talked with client about family support (she has support from her son , Stacy Dalton ) Talked with client about her use of alcohol.   She said she has pain issues in her back. She said:  " I just drink until it does not hurt anymore"  She did not want to go to ER for evaluation. She had gone to ED for evaluation in past 3 weeks. LCSW has talked with son, Stacy Dalton, previously and he said client will sometimes store liquor at her home, will sometimes go to local stores to purchase liquor or will sometimes get a friend to  buy liquor for her. LCSW talked today with Evelina Dun FNP regarding client needs. Christy suggtested client go to ED for evaluation. LCSW relayed this information to client; but, client did not want to go to ED. Client repeatedly refused to go to ED for treatment LCSW talked with Evelina Dun about 911 call by LCSW to see if police could do a Safety check visit on client today. Alyse Low agreed to this approach LCSW called 911 today and spoke with 911 Dispatcher about client needs. LCSW gave Dispatcher  client name, DOB,address, and current issues faced related to alcohol use.  LCSW also gave Dispatcher the name  and phone number of Stacy Dalton, son of client.Dispatcher said officers would make Safety visit at home of client today to  assess her needs Talked with client about mood, depression of client LCSW encouraged client to call [facility] as needed for medical support Client ended call abruptly.  She sounded as if she had been drinking . Some of her words were slightly slurred and she had some confusion LCSW later received call from [facility] who said he had done a safety check for client today and officer said client was ok.  He said client said she was fine and did not have any needs at present.  He did not notice any safety issues for client during Safety visit today  Patient Self Care Activities:  Takes medications as prescribed  Patient Coping Strengths:  Has family support from her son, Stacy Dalton  Patient Self Care Deficits:  Hx Alcohol Use/Abuse  Patient Goals:  - spend time or talk with others every day - practice relaxation or meditation daily - keep a calendar with appointment dates  Follow Up Plan: LCSW to call client or son, Stacy Dalton on 03/04/21     Norva Riffle.Sulaiman Imbert MSW, LCSW Licensed Clinical Social Worker [facility] Care Management [phone]

## 2021-02-02 ENCOUNTER — Telehealth: Payer: Self-pay | Admitting: Family

## 2021-02-02 NOTE — Telephone Encounter (Signed)
Spoke to patients son.  He is listed on a [facility] system wide DPR.  He states that his mother told him that someone here told her that she has something wrong with her brain and that she is going to die.  I took a look at last clinic note from provider and social work and I could not find anything that indicated. Son Stacy Dalton is hoping for the provider to contact for clarification and had some questions regarding some other things as well

## 2021-02-03 NOTE — Telephone Encounter (Signed)
I do not see any mention of this in her chart. I believe the patient misunderstood the information given to her.

## 2021-02-03 NOTE — Telephone Encounter (Signed)
Spoke with patient son he is aware and verbalized understanding. States patient is talking out of her head a lot. At the point she can not take care of her self can not even get up to walk at this point. They were thinking of getting her put in nursing facility for short stay if insurance would cover. Advised patient really needed appointment look like the last couple were canceled. Patient son is going to get with siblings and call back and get a appointment set up. FYI

## 2021-02-04 ENCOUNTER — Inpatient Hospital Stay (HOSPITAL_COMMUNITY)
Admission: EM | Admit: 2021-02-04 | Discharge: 2021-02-07 | DRG: 897 | Disposition: A | Discharge status: Home Health | Payer: Medicare Other | Attending: Internal Medicine | Admitting: Internal Medicine

## 2021-02-04 ENCOUNTER — Encounter (HOSPITAL_COMMUNITY): Payer: Self-pay

## 2021-02-04 ENCOUNTER — Emergency Department (HOSPITAL_COMMUNITY): Payer: Medicare Other

## 2021-02-04 DIAGNOSIS — F10939 Alcohol use, unspecified with withdrawal, unspecified: Secondary | ICD-10-CM | POA: Diagnosis present

## 2021-02-04 DIAGNOSIS — M546 Pain in thoracic spine: Secondary | ICD-10-CM

## 2021-02-04 DIAGNOSIS — Z20822 Contact with and (suspected) exposure to covid-19: Secondary | ICD-10-CM | POA: Diagnosis present

## 2021-02-04 DIAGNOSIS — Z823 Family history of stroke: Secondary | ICD-10-CM

## 2021-02-04 DIAGNOSIS — R000 Tachycardia, unspecified: Secondary | ICD-10-CM | POA: Diagnosis present

## 2021-02-04 DIAGNOSIS — F10930 Alcohol use, unspecified with withdrawal, uncomplicated: Secondary | ICD-10-CM

## 2021-02-04 DIAGNOSIS — Z833 Family history of diabetes mellitus: Secondary | ICD-10-CM | POA: Diagnosis not present

## 2021-02-04 DIAGNOSIS — E876 Hypokalemia: Secondary | ICD-10-CM | POA: Diagnosis present

## 2021-02-04 DIAGNOSIS — Z888 Allergy status to other drugs, medicaments and biological substances status: Secondary | ICD-10-CM

## 2021-02-04 DIAGNOSIS — F10230 Alcohol dependence with withdrawal, uncomplicated: Secondary | ICD-10-CM | POA: Diagnosis present

## 2021-02-04 DIAGNOSIS — I10 Essential (primary) hypertension: Secondary | ICD-10-CM | POA: Diagnosis present

## 2021-02-04 DIAGNOSIS — M549 Dorsalgia, unspecified: Secondary | ICD-10-CM | POA: Diagnosis present

## 2021-02-04 DIAGNOSIS — G8929 Other chronic pain: Secondary | ICD-10-CM | POA: Diagnosis present

## 2021-02-04 DIAGNOSIS — F10239 Alcohol dependence with withdrawal, unspecified: Secondary | ICD-10-CM | POA: Diagnosis present

## 2021-02-04 DIAGNOSIS — Z8349 Family history of other endocrine, nutritional and metabolic diseases: Secondary | ICD-10-CM

## 2021-02-04 DIAGNOSIS — F32A Depression, unspecified: Secondary | ICD-10-CM | POA: Diagnosis present

## 2021-02-04 DIAGNOSIS — K5909 Other constipation: Secondary | ICD-10-CM | POA: Diagnosis present

## 2021-02-04 DIAGNOSIS — F419 Anxiety disorder, unspecified: Secondary | ICD-10-CM | POA: Diagnosis present

## 2021-02-04 DIAGNOSIS — M8580 Other specified disorders of bone density and structure, unspecified site: Secondary | ICD-10-CM | POA: Diagnosis present

## 2021-02-04 DIAGNOSIS — J449 Chronic obstructive pulmonary disease, unspecified: Secondary | ICD-10-CM | POA: Diagnosis present

## 2021-02-04 DIAGNOSIS — Z800 Family history of malignant neoplasm of digestive organs: Secondary | ICD-10-CM | POA: Diagnosis not present

## 2021-02-04 DIAGNOSIS — E538 Deficiency of other specified B group vitamins: Secondary | ICD-10-CM | POA: Diagnosis present

## 2021-02-04 DIAGNOSIS — E039 Hypothyroidism, unspecified: Secondary | ICD-10-CM | POA: Diagnosis present

## 2021-02-04 DIAGNOSIS — Z825 Family history of asthma and other chronic lower respiratory diseases: Secondary | ICD-10-CM | POA: Diagnosis not present

## 2021-02-04 DIAGNOSIS — F410 Panic disorder [episodic paroxysmal anxiety] without agoraphobia: Secondary | ICD-10-CM | POA: Diagnosis present

## 2021-02-04 DIAGNOSIS — Z9109 Other allergy status, other than to drugs and biological substances: Secondary | ICD-10-CM

## 2021-02-04 DIAGNOSIS — R5381 Other malaise: Secondary | ICD-10-CM

## 2021-02-04 DIAGNOSIS — R0602 Shortness of breath: Secondary | ICD-10-CM | POA: Diagnosis not present

## 2021-02-04 DIAGNOSIS — Z8249 Family history of ischemic heart disease and other diseases of the circulatory system: Secondary | ICD-10-CM

## 2021-02-04 DIAGNOSIS — F1020 Alcohol dependence, uncomplicated: Secondary | ICD-10-CM | POA: Diagnosis not present

## 2021-02-04 DIAGNOSIS — J42 Unspecified chronic bronchitis: Secondary | ICD-10-CM | POA: Diagnosis not present

## 2021-02-04 DIAGNOSIS — K219 Gastro-esophageal reflux disease without esophagitis: Secondary | ICD-10-CM | POA: Diagnosis present

## 2021-02-04 DIAGNOSIS — F418 Other specified anxiety disorders: Secondary | ICD-10-CM | POA: Diagnosis not present

## 2021-02-04 LAB — COMPREHENSIVE METABOLIC PANEL
ALT: 31 U/L (ref 0–44)
AST: 70 U/L — ABNORMAL HIGH (ref 15–41)
Albumin: 4.3 g/dL (ref 3.5–5.0)
Alkaline Phosphatase: 79 U/L (ref 38–126)
Anion gap: 16 — ABNORMAL HIGH (ref 5–15)
BUN: 18 mg/dL (ref 8–23)
CO2: 17 mmol/L — ABNORMAL LOW (ref 22–32)
Calcium: 8 mg/dL — ABNORMAL LOW (ref 8.9–10.3)
Chloride: 100 mmol/L (ref 98–111)
Creatinine, Ser: 0.81 mg/dL (ref 0.44–1.00)
GFR, Estimated: >60 mL/min (ref >60)
Glucose, Bld: 96 mg/dL (ref 70–99)
Potassium: 3.9 mmol/L (ref 3.5–5.1)
Sodium: 133 mmol/L — ABNORMAL LOW (ref 135–145)
Total Bilirubin: 1.3 mg/dL — ABNORMAL HIGH (ref 0.3–1.2)
Total Protein: 6.7 g/dL (ref 6.5–8.1)

## 2021-02-04 LAB — CBC WITH DIFFERENTIAL/PLATELET
Abs Immature Granulocytes: 0.06 10*3/uL (ref 0.00–0.07)
Basophils Absolute: 0.1 10*3/uL (ref 0.0–0.1)
Basophils Relative: 1 %
Eosinophils Absolute: 0 10*3/uL (ref 0.0–0.5)
Eosinophils Relative: 0 %
HCT: 32 % — ABNORMAL LOW (ref 36.0–46.0)
Hemoglobin: 10.4 g/dL — ABNORMAL LOW (ref 12.0–15.0)
Immature Granulocytes: 1 %
Lymphocytes Relative: 9 %
Lymphs Abs: 0.8 10*3/uL (ref 0.7–4.0)
MCH: 27 pg (ref 26.0–34.0)
MCHC: 32.5 g/dL (ref 30.0–36.0)
MCV: 83.1 fL (ref 80.0–100.0)
Monocytes Absolute: 0.6 10*3/uL (ref 0.1–1.0)
Monocytes Relative: 6 %
Neutro Abs: 7.8 10*3/uL — ABNORMAL HIGH (ref 1.7–7.7)
Neutrophils Relative %: 83 %
Platelets: 122 10*3/uL — ABNORMAL LOW (ref 150–400)
RBC: 3.85 MIL/uL — ABNORMAL LOW (ref 3.87–5.11)
RDW: 20.5 % — ABNORMAL HIGH (ref 11.5–15.5)
WBC: 9.3 10*3/uL (ref 4.0–10.5)
nRBC: 0 % (ref 0.0–0.2)

## 2021-02-04 LAB — MAGNESIUM: Magnesium: 1.6 mg/dL — ABNORMAL LOW (ref 1.7–2.4)

## 2021-02-04 LAB — RESP PANEL BY RT-PCR (FLU A&B, COVID) ARPGX2
Influenza A by PCR: NEGATIVE
Influenza B by PCR: NEGATIVE
SARS Coronavirus 2 by RT PCR: NEGATIVE

## 2021-02-04 LAB — ETHANOL: Alcohol, Ethyl (B): 30 mg/dL — ABNORMAL HIGH (ref <10)

## 2021-02-04 LAB — VITAMIN B12: Vitamin B-12: 111 pg/mL — ABNORMAL LOW (ref 180–914)

## 2021-02-04 MED ORDER — ONDANSETRON HCL 4 MG PO TABS: 4.0000 mg | ORAL_TABLET | Freq: Four times a day (QID) | ORAL | Status: DC | PRN

## 2021-02-04 MED ORDER — LEVOTHYROXINE SODIUM 50 MCG PO TABS
50.0000 ug | ORAL_TABLET | Freq: Every day | ORAL | Status: DC
  Administered 2021-02-05 – 2021-02-07 (×3): 50 ug via ORAL
  Filled 2021-02-04 (×3): qty 1

## 2021-02-04 MED ORDER — LORAZEPAM 2 MG/ML IJ SOLN
1.0000 mg | INTRAMUSCULAR | Status: AC | PRN
  Administered 2021-02-05: 3 mg via INTRAVENOUS
  Administered 2021-02-06: 2 mg via INTRAVENOUS
  Filled 2021-02-04: qty 2
  Filled 2021-02-04: qty 1

## 2021-02-04 MED ORDER — ONDANSETRON HCL 4 MG PO TABS
4.0000 mg | ORAL_TABLET | Freq: Three times a day (TID) | ORAL | Status: DC | PRN
  Administered 2021-02-04: 4 mg via ORAL
  Filled 2021-02-04: qty 1

## 2021-02-04 MED ORDER — ENOXAPARIN SODIUM 40 MG/0.4ML IJ SOSY
40.0000 mg | PREFILLED_SYRINGE | INTRAMUSCULAR | Status: DC
  Administered 2021-02-04 – 2021-02-07 (×4): 40 mg via SUBCUTANEOUS
  Filled 2021-02-04 (×4): qty 0.4

## 2021-02-04 MED ORDER — OXYCODONE HCL 5 MG PO TABS
5.0000 mg | ORAL_TABLET | ORAL | Status: DC | PRN
  Administered 2021-02-05 – 2021-02-07 (×8): 5 mg via ORAL
  Filled 2021-02-04 (×8): qty 1

## 2021-02-04 MED ORDER — SODIUM CHLORIDE 0.9% FLUSH
3.0000 mL | Freq: Two times a day (BID) | INTRAVENOUS | Status: DC
  Administered 2021-02-04 – 2021-02-07 (×4): 3 mL via INTRAVENOUS

## 2021-02-04 MED ORDER — LORAZEPAM 2 MG/ML IJ SOLN
0.0000 mg | Freq: Two times a day (BID) | INTRAMUSCULAR | Status: DC
  Administered 2021-02-06 – 2021-02-07 (×2): 2 mg via INTRAVENOUS
  Filled 2021-02-04 (×2): qty 1

## 2021-02-04 MED ORDER — LORAZEPAM 1 MG PO TABS
1.0000 mg | ORAL_TABLET | ORAL | Status: AC | PRN
  Administered 2021-02-04: 1 mg via ORAL
  Administered 2021-02-05 (×2): 2 mg via ORAL
  Filled 2021-02-04: qty 2
  Filled 2021-02-04: qty 1
  Filled 2021-02-04: qty 2

## 2021-02-04 MED ORDER — DULOXETINE HCL 60 MG PO CPEP
60.0000 mg | ORAL_CAPSULE | Freq: Every day | ORAL | Status: DC
  Administered 2021-02-04 – 2021-02-07 (×4): 60 mg via ORAL
  Filled 2021-02-04 (×2): qty 1
  Filled 2021-02-04: qty 2
  Filled 2021-02-04: qty 1

## 2021-02-04 MED ORDER — LORAZEPAM 2 MG/ML IJ SOLN
0.0000 mg | Freq: Four times a day (QID) | INTRAMUSCULAR | Status: DC
Start: 2021-02-04 — End: 2021-02-04
  Administered 2021-02-04: 2 mg via INTRAVENOUS
  Filled 2021-02-04: qty 2

## 2021-02-04 MED ORDER — ADULT MULTIVITAMIN W/MINERALS CH
1.0000 | ORAL_TABLET | Freq: Every day | ORAL | Status: DC
  Administered 2021-02-04 – 2021-02-07 (×4): 1 via ORAL
  Filled 2021-02-04 (×4): qty 1

## 2021-02-04 MED ORDER — LORAZEPAM 2 MG/ML IJ SOLN
0.0000 mg | Freq: Four times a day (QID) | INTRAMUSCULAR | Status: AC
  Administered 2021-02-04: 1 mg via INTRAVENOUS
  Administered 2021-02-04 – 2021-02-06 (×7): 2 mg via INTRAVENOUS
  Filled 2021-02-04 (×8): qty 1

## 2021-02-04 MED ORDER — FLUTICASONE FUROATE-VILANTEROL 200-25 MCG/INH IN AEPB
1.0000 | INHALATION_SPRAY | Freq: Every day | RESPIRATORY_TRACT | Status: DC
  Administered 2021-02-05 – 2021-02-07 (×2): 1 via RESPIRATORY_TRACT
  Filled 2021-02-04: qty 28

## 2021-02-04 MED ORDER — PANTOPRAZOLE SODIUM 40 MG PO TBEC
40.0000 mg | DELAYED_RELEASE_TABLET | Freq: Every day | ORAL | Status: DC
  Administered 2021-02-04 – 2021-02-07 (×4): 40 mg via ORAL
  Filled 2021-02-04 (×4): qty 1

## 2021-02-04 MED ORDER — CYANOCOBALAMIN 1000 MCG/ML IJ SOLN
1000.0000 ug | Freq: Every day | INTRAMUSCULAR | Status: DC
  Administered 2021-02-04 – 2021-02-07 (×4): 1000 ug via INTRAMUSCULAR
  Filled 2021-02-04 (×4): qty 1

## 2021-02-04 MED ORDER — ONDANSETRON HCL 4 MG/2ML IJ SOLN: 4.0000 mg | Freq: Four times a day (QID) | INTRAMUSCULAR | Status: DC | PRN

## 2021-02-04 MED ORDER — THIAMINE HCL 100 MG/ML IJ SOLN
100.0000 mg | Freq: Every day | INTRAMUSCULAR | Status: DC
Start: 2021-02-04 — End: 2021-02-07
  Filled 2021-02-04: qty 2

## 2021-02-04 MED ORDER — THIAMINE HCL 100 MG PO TABS
100.0000 mg | ORAL_TABLET | Freq: Every day | ORAL | Status: DC
Start: 2021-02-04 — End: 2021-02-07
  Administered 2021-02-04 – 2021-02-07 (×4): 100 mg via ORAL
  Filled 2021-02-04 (×4): qty 1

## 2021-02-04 MED ORDER — SODIUM CHLORIDE 0.9 % IV SOLN: Freq: Once | INTRAVENOUS | Status: AC

## 2021-02-04 MED ORDER — LORAZEPAM 2 MG/ML IJ SOLN
2.0000 mg | Freq: Once | INTRAMUSCULAR | Status: AC
  Administered 2021-02-04: 2 mg via INTRAVENOUS

## 2021-02-04 MED ORDER — FOLIC ACID 1 MG PO TABS
1.0000 mg | ORAL_TABLET | Freq: Every day | ORAL | Status: DC
  Administered 2021-02-04 – 2021-02-07 (×4): 1 mg via ORAL
  Filled 2021-02-04 (×4): qty 1

## 2021-02-04 MED ORDER — MAGNESIUM SULFATE 2 GM/50ML IV SOLN
2.0000 g | Freq: Once | INTRAVENOUS | Status: AC
  Administered 2021-02-04: 2 g via INTRAVENOUS
  Filled 2021-02-04: qty 50

## 2021-02-04 MED ORDER — ACETAMINOPHEN 325 MG PO TABS
650.0000 mg | ORAL_TABLET | Freq: Four times a day (QID) | ORAL | Status: DC | PRN
  Administered 2021-02-04: 650 mg via ORAL
  Filled 2021-02-04: qty 2

## 2021-02-04 MED ORDER — POLYETHYLENE GLYCOL 3350 17 G PO PACK: 17.0000 g | PACK | Freq: Every day | ORAL | Status: DC | PRN

## 2021-02-04 MED ORDER — ALBUTEROL SULFATE (2.5 MG/3ML) 0.083% IN NEBU: 2.5000 mg | INHALATION_SOLUTION | RESPIRATORY_TRACT | Status: DC | PRN

## 2021-02-04 MED ORDER — ACETAMINOPHEN 650 MG RE SUPP: 650.0000 mg | Freq: Four times a day (QID) | RECTAL | Status: DC | PRN

## 2021-02-04 MED ORDER — SODIUM CHLORIDE 0.9 % IV SOLN: INTRAVENOUS | Status: DC

## 2021-02-04 NOTE — ED Notes (Signed)
Pt. Called out requesting to speak with nurse. Pt. Stated they were starting to feel itchy.

## 2021-02-04 NOTE — ED Notes (Signed)
Pt Sleeping

## 2021-02-04 NOTE — ED Provider Notes (Signed)
[facility] EMERGENCY DEPARTMENT Provider Note   CSN: [id number] Arrival date & time: 02/04/21  1053     History No chief complaint on file.   Stacy Dalton is a 70 y.o. ill-appearing female BIB EMS after calling for difficulty breathing.  Patient reports that this started earlier today and that it got to a point where she felt like she was going to stop breathing.  At the moment she had no chest pain but that has since resolved.  She reports that she thinks that this is alcohol withdrawals.  Last drink yesterday-unknown time. She states that she only drinks vodka, but drinks 1 big bottle every 2 days. Has been doing this for many years.  States that she does not believe she can take care of herself anymore.  Daughter lives next door but is not interested in caring for her, "only her dog."  Says that she has tried to quit drinking multiple times but cannot tolerate the tremors and dizziness.  Currently requesting tramadol for back pain.     Past Medical History:  Diagnosis Date   Chronic pain    COPD (chronic obstructive pulmonary disease) ([place])    told has copd, no current inhaler use   Cough    Depression    ETOH abuse    GERD (gastroesophageal reflux disease)    Hypertension    Hypothyroidism    Insomnia    Migraine    Migraine    Osteopenia    Pain management    Panic attacks    Small bowel obstruction (HCC)     Patient Active Problem List   Diagnosis Date Noted   Leukocytosis 11/23/2020   Alcohol use disorder, severe, dependence ([place]) 10/08/2020   Hyponatremia 09/17/2020   Alcohol withdrawal ([place]) 09/12/2020   Chronic bronchitis ([place]) 08/20/2020   Moderate protein malnutrition (HCC) 08/20/2020   Chronic low back pain 08/19/2020   Elevated SGOT ([facility]) 08/19/2020   Elevated troponin 08/19/2020   Increased serum lipase level 08/19/2020   Alcohol abuse 06/21/2020   Multifocal pneumonia 09/19/2018   Large bowel obstruction (HCC)    Small bowel obstruction ([place])  07/20/2018   Hypomagnesemia 07/20/2018   Cough 03/14/2018   Fatigue 03/14/2018   Polypharmacy 02/23/2017   Hypokalemia [id number]   Toxic metabolic encephalopathy [id number]   Anxiety 02/22/2017   GERD (gastroesophageal reflux disease) 02/22/2017   Pain medication agreement signed 10/10/2016   Opioid dependence ([place]) 10/10/2016   Status post colostomy takedown 04/12/2016   Chronic constipation 12/03/2015   Peritonitis with abscess of intestine ([place]) 11/29/2015   COPD exacerbation ([place]) 09/20/2015   Osteopenia 06/10/2014   Vitamin D deficiency 06/10/2014   Hypothyroidism    Depression 11/18/2012   Insomnia 11/18/2012   Chronic pain syndrome 11/18/2012   HLD (hyperlipidemia) 09/01/2012   S/P colostomy ([place]) 04/19/2012   Normocytic anemia 10/15/2011   Hypertension 10/13/2011   Chronic back pain 10/13/2011   COPD (chronic obstructive pulmonary disease) ([place]) 10/09/2011    Past Surgical History:  Procedure Laterality Date   ABDOMINAL HYSTERECTOMY     COLON SURGERY     COLOSTOMY CLOSURE  04/19/2012   Procedure: COLOSTOMY CLOSURE;  Surgeon: Adin Hector, MD;  Location: [facility];  Service: General;  Laterality: N/A;  Laparotomy, Resection and Closure of Colostomy   COLOSTOMY TAKEDOWN N/A 04/12/2016   Procedure: [place] TAKEDOWN;  Surgeon: Johnathan Hausen, MD;  Location: [facility];  Service: General;  Laterality: N/A;   INCONTINENCE SURGERY     LAPAROTOMY  10/04/2011, colostomy also   Procedure: EXPLORATORY LAPAROTOMY;  Surgeon: Adin Hector, MD;  Location: [facility];  Service: General;  Laterality: N/A;  left partial colectomy with colostomy   LAPAROTOMY  04/19/2012   Procedure: EXPLORATORY LAPAROTOMY;  Surgeon: Adin Hector, MD;  Location: [facility];  Service: General;  Laterality: N/A;   LAPAROTOMY N/A 11/29/2015   Procedure: EXPLORATORY LAPAROTOMY; SUBTOTAL COLECTOMY WITH HARTMAN PROCEDURE AND END COLOSTOMY;  Surgeon: Johnathan Hausen, MD;  Location: [facility];  Service: General;   Laterality: N/A;   TUBAL LIGATION     VENTRAL HERNIA REPAIR  04/19/2012   Procedure: HERNIA REPAIR VENTRAL ADULT;  Surgeon: Adin Hector, MD;  Location: [facility];  Service: General;  Laterality: N/A;     OB History     Gravida      Para      Term      Preterm      AB      Living  5      SAB      IAB      Ectopic      Multiple      Live Births              Family History  Problem Relation Age of Onset   Heart disease Mother    Hypertension Mother    Cancer Sister        sinus   Diabetes Brother    Heart disease Brother    Thyroid disease Brother    Cancer Sister        abdominal ?   Thyroid disease Sister    Cancer Other        GE junction adenocarcinoma   Emphysema Father    Stroke Father    Hypertension Father    Heart disease Father    COPD Sister    Thyroid disease Sister    Thyroid disease Sister    Diabetes Brother    Thyroid disease Brother    Thyroid disease Brother    Hypertension Brother    Post-traumatic stress disorder Brother    COPD Brother    Thyroid disease Brother    Thyroid disease Brother    Hypertension Brother    Transient ischemic attack Brother     Social History   Tobacco Use   Smoking status: Former    Packs/day: 1.50    Years: 20.00    Pack years: 30.00    Types: Cigarettes    Quit date: 10/04/1998    Years since quitting: 22.3   Smokeless tobacco: Never  Vaping Use   Vaping Use: Never used  Substance Use Topics   Alcohol use: Yes    Comment: 1bottle vodka -3 days    Drug use: No    Home Medications Prior to Admission medications   Medication Sig Start Date End Date Taking? Authorizing Provider  amLODipine-olmesartan (AZOR) 10-40 MG tablet Take 1 tablet by mouth daily. 11/30/20   Evelina Dun A, FNP  Aspirin-Caffeine (BC FAST PAIN RELIEF) 845-65 MG PACK Take 1 packet by mouth daily as needed (pain or headache).    [provider]  chlordiazePOXIDE (LIBRIUM) 10 MG capsule Take by mouth.  11/26/20   [provider]  DULoxetine (CYMBALTA) 60 MG capsule Take 1 capsule (60 mg total) by mouth daily. 04/27/20   Hawks, Alyse Low A, FNP  fluticasone furoate-vilanterol (BREO ELLIPTA) 200-25 MCG/INH AEPB Inhale 1 puff into the lungs daily. 01/16/20   [name], FNP  Fluticasone-Salmeterol (ADVAIR) 100-50 MCG/DOSE AEPB Inhale 1 puff into the lungs daily as needed (shortness of breath). 02/09/20   [provider]  folic acid (FOLVITE) 1 MG tablet Take 1 tablet (1 mg total) by mouth daily. 08/11/20   Wyvonnia Dusky, MD  hydrOXYzine (VISTARIL) 25 MG capsule Take 1 capsule (25 mg total) by mouth 3 (three) times daily as needed for anxiety. 08/27/20   [name], FNP  levothyroxine (SYNTHROID) 50 MCG tablet Take 1 tablet (50 mcg total) by mouth daily before breakfast. 06/22/20   Evelina Dun A, FNP  magnesium oxide (MAG-OX) 400 (241.3 Mg) MG tablet Take 1 tablet (400 mg total) by mouth daily. 10/12/20   Lorella Nimrod, MD  pantoprazole (PROTONIX) 40 MG tablet Take 1 tablet (40 mg total) by mouth daily. Patient not taking: No sig reported 10/12/20   Lorella Nimrod, MD  potassium chloride (KLOR-CON) 10 MEQ tablet Take 1 tablet (10 mEq total) by mouth daily. 11/21/20 12/21/20  Wyvonnia Dusky, MD  thiamine 100 MG tablet Take 1 tablet (100 mg total) by mouth daily. Patient not taking: No sig reported 10/12/20   Lorella Nimrod, MD  vitamin B-12 (CYANOCOBALAMIN) 1000 MCG tablet Take 1 tablet (1,000 mcg total) by mouth daily. Patient not taking: No sig reported 10/12/20   Lorella Nimrod, MD  Vitamin D, Ergocalciferol, (DRISDOL) 1.25 MG (50000 UNIT) CAPS capsule Take 1 capsule (50,000 Units total) by mouth every 7 (seven) days. Patient not taking: No sig reported 06/22/20   [name], FNP    Allergies    Librium [chlordiazepoxide], Toradol [ketorolac tromethamine], Butalbital-apap-caffeine, Demerol, Esgic [butalbital-apap-caffeine], and Iodine  Review of Systems   Review of  Systems  Constitutional:  Positive for diaphoresis. Negative for chills and fever.  Respiratory:  Negative for cough and shortness of breath.   Cardiovascular:  Negative for chest pain and palpitations.  Gastrointestinal:  Negative for abdominal pain and vomiting.  Genitourinary:  Negative for dysuria and hematuria.  Musculoskeletal:  Negative for arthralgias and back pain.  Skin:  Negative for color change and rash.  Neurological:  Positive for dizziness, tremors, weakness and light-headedness. Negative for seizures, syncope, facial asymmetry, speech difficulty, numbness and headaches.  Psychiatric/Behavioral:  Negative for confusion and hallucinations. The patient is nervous/anxious.   All other systems reviewed and are negative.  Physical Exam Updated Vital Signs BP (!) 152/69 (BP Location: Left Arm)   Pulse (!) 128   Temp 98.4 F (36.9 C) (Oral)   Resp 19   Ht '5\' 1"'$  (1.549 m)   Wt 53.5 kg   SpO2 100%   BMI 22.30 kg/m   Physical Exam Vitals and nursing note reviewed.  Constitutional:      General: She is in acute distress.     Appearance: She is ill-appearing, toxic-appearing and diaphoretic.  HENT:     Head: Normocephalic and atraumatic.  Eyes:     General: No scleral icterus.    Conjunctiva/sclera: Conjunctivae normal.     Pupils: Pupils are equal, round, and reactive to light.  Cardiovascular:     Rate and Rhythm: Regular rhythm. Tachycardia present.  Pulmonary:     Effort: Pulmonary effort is normal. No respiratory distress.  Musculoskeletal:     Cervical back: Normal range of motion.  Skin:    General: Skin is warm.     Coloration: Skin is pale.     Findings: No rash.  Neurological:     General: No focal deficit present.  Mental Status: She is alert.     Motor: Weakness present.  Psychiatric:     Comments: Anxious and teary.    ED Results / Procedures / Treatments   Labs (all labs ordered are listed, but only abnormal results are displayed) Labs  Reviewed - No data to display  EKG EKG Interpretation  Date/Time:  Friday February 04 2021 11:51:06 EDT Ventricular Rate:  103 PR Interval:  119 QRS Duration: 81 QT Interval:  345 QTC Calculation: 452 R Axis:   67 Text Interpretation: Sinus tachycardia since last tracing no significant change Confirmed by Noemi Chapel [phone]) on 02/04/2021 12:19:32 PM  Radiology DG Chest Portable 1 View  Result Date: 02/04/2021 CLINICAL DATA:  Shortness of breath EXAM: PORTABLE CHEST 1 VIEW COMPARISON:  11/26/2020 FINDINGS: Unchanged cardiomediastinal silhouette. No new focal airspace disease. No large pleural effusion or visible pneumothorax. No acute osseous abnormality. IMPRESSION: No evidence of acute cardiopulmonary disease. Electronically Signed   By: Maurine Simmering   On: 02/04/2021 12:05     Medications Ordered in ED Medications  LORazepam (ATIVAN) injection 0-4 mg (2 mg Intravenous Given 02/04/21 1151)  thiamine tablet 100 mg (has no administration in time range)    Or  thiamine (B-1) injection 100 mg (has no administration in time range)  ondansetron (ZOFRAN) tablet 4 mg (4 mg Oral Given 02/04/21 1200)  magnesium sulfate IVPB 2 g 50 mL (has no administration in time range)  0.9 %  sodium chloride infusion ( Intravenous New Bag/Given 02/04/21 1215)  LORazepam (ATIVAN) injection 2 mg (2 mg Intravenous Given 02/04/21 1246)    ED Course  I have reviewed the triage vital signs and the nursing notes.  Pertinent labs & imaging results that were available during my care of the patient were reviewed by me and considered in my medical decision making (see chart for details).  Patient given 4 mg of Ativan, IVF, thiamine and CXR in the ED.  EKG shows tachycardia without rhythmic changes.  Magnesium replaced.  ETOH '30mg'$ /dL at this time.  To be admitted.  Clinical Course as of 02/04/21 1250  Fri Feb 04, 2021  1235 Patient reports feeling better. Mild tremors noted. I ordered '2mg'$  more of Ativan. Discussed her  likely admission and current ETOH level. [MR]    Clinical Course User Index [MR] Kaedynce Tapp, Cecilio Asper, PA-C   MDM Rules/Calculators/A&P                          Patient is extremely ill-appearing.  Tremors relieved with Ativan however she is deemed high risk for further EtOH withdrawal complications.  Will be admitted to treat withdrawals and monitor CIWA score.   Final Clinical Impression(s) / ED Diagnoses Final diagnoses:  Alcohol withdrawal syndrome without complication ([place])    Rx / DC Orders Patient to be admitted and expresses understanding. Dr. Sabra Heck called hospitalist who agrees to admit.    Darliss Ridgel 02/04/21 1306    Noemi Chapel, MD 02/05/21 [phone]

## 2021-02-04 NOTE — ED Notes (Addendum)
Per pts. Son [name]) pt. Has been calling him and saying " I am dying, something is wrong with my head." Son states that this pt. Hasn't been able to take care of their daily cares. Son states that pt. Has been unable to get up to the restroom for the last couple of weeks.

## 2021-02-04 NOTE — ED Notes (Signed)
ED TO INPATIENT HANDOFF REPORT  ED Nurse Name and Phone #:   S Name/Age/Gender Stacy Dalton 70 y.o. female Room/Bed: APA10/APA10  Code Status   Code Status: Full Code  Home/SNF/Other Home Patient oriented to: self, place, time and situation Is this baseline? Yes   Triage Complete: Triage complete  Chief Complaint Alcohol withdrawal ([place]) [F10.239]  Triage Note Pt. States they drink a bottle of vodka almost every day. Pt. States they haven't had a drink since yesterday. Pt. States they are having a hard time breathing.    Allergies Allergies  Allergen Reactions  . Librium [Chlordiazepoxide]     Became unresponsive  . Toradol [Ketorolac Tromethamine] Shortness Of Breath  . Butalbital-Apap-Caffeine Other (See Comments)    jittery  . Demerol Swelling  . Esgic [Butalbital-Apap-Caffeine] Other (See Comments)    jittery  . Iodine Other (See Comments)    bp bottomed out per pt several hours later; unsure if pre medicated in past with cm; [facility]    Level of Care/Admitting Diagnosis ED Disposition    ED Disposition  Admit   Condition  --   [place]: [facility] [id number]  Level of Care: Telemetry [5]  Covid Evaluation: Asymptomatic Screening Protocol (No Symptoms)  Diagnosis: Alcohol withdrawal ([place]) [291.81.ICD-9-CM]  Admitting Physician: Bonnielee Haff [[id number]]  Attending Physician: Bonnielee Haff [[id number]]  Estimated length of stay: past midnight tomorrow  Certification:: I certify this patient will need inpatient services for at least 2 midnights         B Medical/Surgery History Past Medical History:  Diagnosis Date  . Chronic pain   . COPD (chronic obstructive pulmonary disease) ([place])    told has copd, no current inhaler use  . Cough   . Depression   . ETOH abuse   . GERD (gastroesophageal reflux disease)   . Hypertension   . Hypothyroidism   . Insomnia   . Migraine   . Migraine   . Osteopenia   . Pain management    . Panic attacks   . Small bowel obstruction [facility])    Past Surgical History:  Procedure Laterality Date  . ABDOMINAL HYSTERECTOMY    . COLON SURGERY    . COLOSTOMY CLOSURE  04/19/2012   Procedure: COLOSTOMY CLOSURE;  Surgeon: Adin Hector, MD;  Location: [facility];  Service: General;  Laterality: N/A;  Laparotomy, Resection and Closure of Colostomy  . COLOSTOMY TAKEDOWN N/A 04/12/2016   Procedure: [place] TAKEDOWN;  Surgeon: Johnathan Hausen, MD;  Location: [facility];  Service: General;  Laterality: N/A;  . INCONTINENCE SURGERY    . LAPAROTOMY  10/04/2011, colostomy also   Procedure: EXPLORATORY LAPAROTOMY;  Surgeon: Adin Hector, MD;  Location: [facility];  Service: General;  Laterality: N/A;  left partial colectomy with colostomy  . LAPAROTOMY  04/19/2012   Procedure: EXPLORATORY LAPAROTOMY;  Surgeon: Adin Hector, MD;  Location: [facility];  Service: General;  Laterality: N/A;  . LAPAROTOMY N/A 11/29/2015   Procedure: EXPLORATORY LAPAROTOMY; SUBTOTAL COLECTOMY WITH HARTMAN PROCEDURE AND END COLOSTOMY;  Surgeon: Johnathan Hausen, MD;  Location: [facility];  Service: General;  Laterality: N/A;  . TUBAL LIGATION    . VENTRAL HERNIA REPAIR  04/19/2012   Procedure: HERNIA REPAIR VENTRAL ADULT;  Surgeon: Adin Hector, MD;  Location: [facility];  Service: General;  Laterality: N/A;     A IV Location/Drains/Wounds Patient Lines/Drains/Airways Status    Active Line/Drains/Airways    Name Placement date Placement time  Site Days   Peripheral IV 02/04/21 20 G 1" Left Antecubital 02/04/21  1148  Antecubital  less than 1   External Urinary Catheter 02/04/21  1108  --  less than 1          Intake/Output Last 24 hours  Intake/Output Summary (Last 24 hours) at 02/04/2021 2025 Last data filed at 02/04/2021 1726 Gross per 24 hour  Intake 1050 ml  Output --  Net 1050 ml    Labs/Imaging Results for orders placed or performed during the hospital encounter of 02/04/21 (from the past 48 hour(s))  Ethanol      Status: Abnormal   Collection Time: 02/04/21 11:10 AM  Result Value Ref Range   Alcohol, Ethyl (B) 30 (H) <10 mg/dL    Comment: (NOTE) Lowest detectable limit for serum alcohol is 10 mg/dL.  For medical purposes only. Performed at [facility], [address] [place], [place] [address]   CBC with Differential     Status: Abnormal   Collection Time: 02/04/21 11:14 AM  Result Value Ref Range   WBC 9.3 4.0 - 10.5 K/uL   RBC 3.85 (L) 3.87 - 5.11 MIL/uL   Hemoglobin 10.4 (L) 12.0 - 15.0 g/dL   HCT 32.0 (L) 36.0 - 46.0 %   MCV 83.1 80.0 - 100.0 fL   MCH 27.0 26.0 - 34.0 pg   MCHC 32.5 30.0 - 36.0 g/dL   RDW 20.5 (H) 11.5 - 15.5 %   Platelets 122 (L) 150 - 400 K/uL   nRBC 0.0 0.0 - 0.2 %   Neutrophils Relative % 83 %   Neutro Abs 7.8 (H) 1.7 - 7.7 K/uL   Lymphocytes Relative 9 %   Lymphs Abs 0.8 0.7 - 4.0 K/uL   Monocytes Relative 6 %   Monocytes Absolute 0.6 0.1 - 1.0 K/uL   Eosinophils Relative 0 %   Eosinophils Absolute 0.0 0.0 - 0.5 K/uL   Basophils Relative 1 %   Basophils Absolute 0.1 0.0 - 0.1 K/uL   Immature Granulocytes 1 %   Abs Immature Granulocytes 0.06 0.00 - 0.07 K/uL    Comment: Performed at [facility], [address] [place], [place] [address]  Comprehensive metabolic panel     Status: Abnormal   Collection Time: 02/04/21 11:14 AM  Result Value Ref Range   Sodium 133 (L) 135 - 145 mmol/L   Potassium 3.9 3.5 - 5.1 mmol/L   Chloride 100 98 - 111 mmol/L   CO2 17 (L) 22 - 32 mmol/L   Glucose, Bld 96 70 - 99 mg/dL    Comment: Glucose reference range applies only to samples taken after fasting for at least 8 hours.   BUN 18 8 - 23 mg/dL   Creatinine, Ser 0.81 0.44 - 1.00 mg/dL   Calcium 8.0 (L) 8.9 - 10.3 mg/dL   Total Protein 6.7 6.5 - 8.1 g/dL   Albumin 4.3 3.5 - 5.0 g/dL   AST 70 (H) 15 - 41 U/L   ALT 31 0 - 44 U/L   Alkaline Phosphatase 79 38 - 126 U/L   Total Bilirubin 1.3 (H) 0.3 - 1.2 mg/dL   GFR, Estimated >60 >60 mL/min    Comment:  (NOTE) Calculated using the CKD-EPI Creatinine Equation (2021)    Anion gap 16 (H) 5 - 15    Comment: Performed at [facility], [address] [place], [place] [address]  Magnesium     Status: Abnormal   Collection Time: 02/04/21 11:14 AM  Result  Value Ref Range   Magnesium 1.6 (L) 1.7 - 2.4 mg/dL    Comment: Performed at [facility], [address] [place], [place] [address]  Vitamin B12     Status: Abnormal   Collection Time: 02/04/21 11:18 AM  Result Value Ref Range   Vitamin B-12 111 (L) 180 - 914 pg/mL    Comment: (NOTE) This assay is not validated for testing neonatal or myeloproliferative syndrome specimens for Vitamin B12 levels. Performed at [facility], [address] [place], [place] [address]   Resp Panel by RT-PCR (Flu A&B, Covid) Nasopharyngeal Swab     Status: None   Collection Time: 02/04/21  1:21 PM   Specimen: Nasopharyngeal Swab; Nasopharyngeal(NP) swabs in vial transport medium  Result Value Ref Range   SARS Coronavirus 2 by RT PCR NEGATIVE NEGATIVE    Comment: (NOTE) SARS-CoV-2 target nucleic acids are NOT DETECTED.  The SARS-CoV-2 RNA is generally detectable in upper respiratory specimens during the acute phase of infection. The lowest concentration of SARS-CoV-2 viral copies this assay can detect is 138 copies/mL. A negative result does not preclude SARS-Cov-2 infection and should not be used as the sole basis for treatment or other patient management decisions. A negative result may occur with  improper specimen collection/handling, submission of specimen other than nasopharyngeal swab, presence of viral mutation(s) within the areas targeted by this assay, and inadequate number of viral copies(<138 copies/mL). A negative result must be combined with clinical observations, patient history, and epidemiological information. The expected result is Negative.  Fact Sheet for Patients:  EntrepreneurPulse.com.au  Fact Sheet for Healthcare  Providers:  IncredibleEmployment.be  This test is no t yet approved or cleared by the [place] FDA and  has been authorized for detection and/or diagnosis of SARS-CoV-2 by FDA under an Emergency Use Authorization (EUA). This EUA will remain  in effect (meaning this test can be used) for the duration of the COVID-19 declaration under Section 564(b)(1) of the Act, 21 U.S.C.section 360bbb-3(b)(1), unless the authorization is terminated  or revoked sooner.       Influenza A by PCR NEGATIVE NEGATIVE   Influenza B by PCR NEGATIVE NEGATIVE    Comment: (NOTE) The Xpert Xpress SARS-CoV-2/FLU/RSV plus assay is intended as an aid in the diagnosis of influenza from Nasopharyngeal swab specimens and should not be used as a sole basis for treatment. Nasal washings and aspirates are unacceptable for Xpert Xpress SARS-CoV-2/FLU/RSV testing.  Fact Sheet for Patients: EntrepreneurPulse.com.au  Fact Sheet for Healthcare Providers: IncredibleEmployment.be  This test is not yet approved or cleared by the [place] FDA and has been authorized for detection and/or diagnosis of SARS-CoV-2 by FDA under an Emergency Use Authorization (EUA). This EUA will remain in effect (meaning this test can be used) for the duration of the COVID-19 declaration under Section 564(b)(1) of the Act, 21 U.S.C. section 360bbb-3(b)(1), unless the authorization is terminated or revoked.  Performed at [facility], [address] [place], [place] [address]    DG Chest Portable 1 View  Result Date: 02/04/2021 CLINICAL DATA:  Shortness of breath EXAM: PORTABLE CHEST 1 VIEW COMPARISON:  11/26/2020 FINDINGS: Unchanged cardiomediastinal silhouette. No new focal airspace disease. No large pleural effusion or visible pneumothorax. No acute osseous abnormality. IMPRESSION: No evidence of acute cardiopulmonary disease. Electronically Signed   By: Maurine Simmering   On:  02/04/2021 12:05    Pending Labs Unresulted Labs (From admission, onward)    Start     Ordered   02/05/21 0500  Magnesium  Tomorrow morning,   R        02/04/21 1320   02/05/21 0500  CBC  Daily,   R      02/04/21 1320   02/05/21 0500  Comprehensive metabolic panel  Daily,   R      02/04/21 1320   02/05/21 0500  Folate, serum, performed at [facility] lab  Tomorrow morning,   R        02/04/21 1320          Vitals/Pain Today's Vitals   02/04/21 1800 02/04/21 1808 02/04/21 1816 02/04/21 1830  BP: (!) 141/78  (!) 141/78 (!) 142/67  Pulse: (!) 113  (!) 113 (!) 106  Resp: 20   (!) 22  Temp:      TempSrc:      SpO2: 100%   98%  Weight:      Height:      PainSc:  8       Isolation Precautions No active isolations  Medications Medications  thiamine tablet 100 mg (100 mg Oral Given 02/04/21 1318)    Or  thiamine (B-1) injection 100 mg ( Intravenous See Alternative 02/04/21 1318)  DULoxetine (CYMBALTA) DR capsule 60 mg (60 mg Oral Given 02/04/21 1429)  levothyroxine (SYNTHROID) tablet 50 mcg (has no administration in time range)  pantoprazole (PROTONIX) EC tablet 40 mg (40 mg Oral Given [id number])  folic acid (FOLVITE) tablet 1 mg (1 mg Oral Given 02/04/21 1432)  fluticasone furoate-vilanterol (BREO ELLIPTA) 200-25 MCG/INH 1 puff (1 puff Inhalation Not Given 02/04/21 1330)  cyanocobalamin ((VITAMIN B-12)) injection 1,000 mcg (1,000 mcg Intramuscular Given 02/04/21 1557)  LORazepam (ATIVAN) tablet 1-4 mg (1 mg Oral Given 02/04/21 1427)    Or  LORazepam (ATIVAN) injection 1-4 mg ( Intravenous See Alternative 02/04/21 1427)  multivitamin with minerals tablet 1 tablet (1 tablet Oral Given 02/04/21 1529)  LORazepam (ATIVAN) injection 0-4 mg (1 mg Intravenous Given 02/04/21 1703)    Followed by  LORazepam (ATIVAN) injection 0-4 mg (has no administration in time range)  enoxaparin (LOVENOX) injection 40 mg (40 mg Subcutaneous Given 02/04/21 1433)  sodium chloride flush (NS) 0.9 % injection 3 mL (3  mLs Intravenous Not Given 02/04/21 1431)  0.9 %  sodium chloride infusion ( Intravenous New Bag/Given 02/04/21 1727)  acetaminophen (TYLENOL) tablet 650 mg (650 mg Oral Given 02/04/21 1716)    Or  acetaminophen (TYLENOL) suppository 650 mg ( Rectal See Alternative 02/04/21 1716)  oxyCODONE (Oxy IR/ROXICODONE) immediate release tablet 5 mg (has no administration in time range)  ondansetron (ZOFRAN) tablet 4 mg (has no administration in time range)    Or  ondansetron (ZOFRAN) injection 4 mg (has no administration in time range)  polyethylene glycol (MIRALAX / GLYCOLAX) packet 17 g (has no administration in time range)  albuterol (PROVENTIL) (2.5 MG/3ML) 0.083% nebulizer solution 2.5 mg (has no administration in time range)  0.9 %  sodium chloride infusion ( Intravenous Stopped 02/04/21 1726)  magnesium sulfate IVPB 2 g 50 mL (0 g Intravenous Stopped 02/04/21 1444)  LORazepam (ATIVAN) injection 2 mg (2 mg Intravenous Given 02/04/21 1246)    Mobility walks High fall risk   Focused Assessments    R Recommendations: See Admitting Provider Note  Report given to:   Additional Notes:

## 2021-02-04 NOTE — ED Notes (Signed)
Pt. Provided with meal tray and water at bedside.

## 2021-02-04 NOTE — Plan of Care (Signed)
  Problem: Acute Rehab PT Goals(only PT should resolve) Goal: Pt Will Go Supine/Side To Sit Outcome: Progressing Flowsheets (Taken 02/04/2021 1520) Pt will go Supine/Side to Sit: with modified independence Goal: Pt Will Transfer Bed To Chair/Chair To Bed Outcome: Progressing Flowsheets (Taken 02/04/2021 1520) Pt will Transfer Bed to Chair/Chair to Bed: with supervision Goal: Pt Will Ambulate Outcome: Progressing Flowsheets (Taken 02/04/2021 1520) Pt will Ambulate:  50 feet  with supervision  with rolling walker Goal: Pt Will Go Up/Down Stairs Outcome: Progressing Flowsheets (Taken 02/04/2021 1520) Pt will Go Up / Down Stairs:  with min guard assist  1-2 stairs  3:21 PM, 02/04/21 M. Sherlyn Lees, PT, DPT Physical Therapist- [place] Office Number: [phone]

## 2021-02-04 NOTE — ED Notes (Signed)
Pt. Able to hold PO meds and water down.

## 2021-02-04 NOTE — ED Provider Notes (Signed)
This patient is a 70 year old female, she is known to the emergency department for several visits to the emergency department over time including alcohol abuse as she was seen in April and July, she has also been admitted to the hospital for pneumonia back in May at an outside facility.  She called paramedics this morning because of shortness of breath but was found to be severely tremulous as well as tachycardic, she endorsed last having alcohol yesterday and stating that she wanted to stop so she went cold [place].  On exam the patient is acutely ill, she is ill-appearing, she has tachycardic, she has severe tremor, she has difficulty setting up in bed because of the tremor.  She has nauseated and vomiting.  Her lungs are clear, her oxygen is 100%, she was placed on oxygen by paramedics prehospital secondary to feeling short of breath though she was not objectively hypoxic.  She is normotensive, in fact slightly hypertensive.  She is afebrile.  I suspect the primary pathology here is the alcohol withdrawal causing the rest of the symptoms including the nausea and vomiting.  She will need benzodiazepines, IV fluids mag level, thiamine, fluids, cardiac monitroing and CXR - she has COPD but clear lungs.  Medical screening examination/treatment/procedure(s) were conducted as a shared visit with non-physician practitioner(s) and myself.  I personally evaluated the patient during the encounter.  Clinical Impression:   Final diagnoses:  Alcohol withdrawal syndrome without complication (HCC)         Noemi Chapel, MD 02/05/21 [id number]

## 2021-02-04 NOTE — Evaluation (Signed)
Physical Therapy Evaluation Patient Details Name: Stacy Dalton MRN: [id number] DOB: [birth year] Today's Date: 02/04/2021   History of Present Illness  ADITRI METHOT is a 70 y.o. female with a past medical history of essential hypertension, hypothyroidism, alcohol abuse, vitamin B12 deficiency who also has a history of COPD who was in her usual state of health till earlier this morning when she felt like she was having difficulty breathing.  Denies any chest pain.  She drinks alcohol quite heavily, going through multiple bottles of vodka on a weekly basis.  She ran out of alcohol yesterday.  Since then she has been having shaking episodes and tremors.  Denies any seizure activity.  Symptoms got worse this morning when she felt that she was unable to breathe.  After getting medications in the emergency department for her withdrawal symptoms she is feeling better.  She also admitted to nausea and episodes of vomiting overnight.  Denies any abdominal pain.    In the ED she underwent chest x-ray which was unremarkable.  She was noted to be tremulous and tachycardic.  Her symptoms and signs were suggestive of alcohol withdrawal syndrome.  She will need hospitalization for continued management.   Clinical Impression   Pt demonstrates generalized weakness, unsteadiness on feet, difficulty in walking, reduced functional activity tolerance, balance deficits, reduced ability to safely ambulate, and elevated HR at rest and with exertion.  Patient would benefit from PT services to increase functional independence, increase activity tolerance/endurance, improve balance, reduce risk for falls, and improve safety with ambulation and stair negotiation to facilitate safe return to home environment with low risk for falls.     Follow Up Recommendations  HHPT    Equipment Recommendations  None recommended by PT (pt reports she owns RW at home)    Recommendations for Other Services       Precautions /  Restrictions Precautions Precautions: Fall Restrictions Weight Bearing Restrictions: No      Mobility  Bed Mobility Overal bed mobility: Needs Assistance Bed Mobility: Rolling;Supine to Sit;Sit to Supine Rolling: Min assist   Supine to sit: Min assist Sit to supine: Min assist        Transfers Overall transfer level: Needs assistance Equipment used: Rolling walker (2 wheeled) Transfers: Sit to/from [facility] Sit to Stand: Min assist Stand pivot transfers: Min assist       General transfer comment: unsteady  Ambulation/Gait Ambulation/Gait assistance: Min assist Gait Distance (Feet): 10 Feet Assistive device: Rolling walker (2 wheeled) Gait Pattern/deviations: Wide base of support     General Gait Details: unsteadiness throughout  Stairs Stairs:  (DNT due to safety concerns and elevated HR)          Wheelchair Mobility    Modified Rankin (Stroke Patients Only)       Balance Overall balance assessment: Needs assistance Sitting-balance support: Bilateral upper extremity supported Sitting balance-Leahy Scale: Fair     Standing balance support: Bilateral upper extremity supported Standing balance-Leahy Scale: Poor                               Pertinent Vitals/Pain      Home Living Family/patient expects to be discharged to:: Private residence Living Arrangements: Alone Available Help at Discharge: Family Type of Home: House Home Access: Stairs to enter Entrance Stairs-Rails: None Entrance Stairs-Number of Steps: 1 Home Layout: One level Home Equipment: Environmental consultant - 2 wheels Additional Comments: pt reports her daughter  lives next door    Prior Function Level of Independence: Independent with assistive device(s)               Hand Dominance        Extremity/Trunk Assessment   Upper Extremity Assessment Upper Extremity Assessment: Generalized weakness    Lower Extremity Assessment Lower Extremity  Assessment: Generalized weakness       Communication   Communication: No difficulties  Cognition Arousal/Alertness: Awake/alert Behavior During Therapy: WFL for tasks assessed/performed Overall Cognitive Status: Within Functional Limits for tasks assessed                                        General Comments      Exercises     Assessment/Plan    PT Assessment Patient needs continued PT services  PT Problem List Decreased strength;Decreased activity tolerance;Decreased balance;Decreased mobility;Decreased knowledge of use of DME;Cardiopulmonary status limiting activity       PT Treatment Interventions DME instruction;Gait training;Stair training;Functional mobility training;Therapeutic activities;Balance training;Therapeutic exercise;Neuromuscular re-education;Patient/family education    PT Goals (Current goals can be found in the Care Plan section)  Acute Rehab PT Goals Patient Stated Goal: return home when able PT Goal Formulation: With patient Time For Goal Achievement: 02/11/21 Potential to Achieve Goals: Good    Frequency Min 3X/week   Barriers to discharge Decreased caregiver support patient's daughter lives next door, but pt lives alone    Co-evaluation               AM-PAC PT "6 Clicks" Mobility  Outcome Measure Help needed turning from your back to your side while in a flat bed without using bedrails?: A Little Help needed moving from lying on your back to sitting on the side of a flat bed without using bedrails?: A Little Help needed moving to and from a bed to a chair (including a wheelchair)?: A Little Help needed standing up from a chair using your arms (e.g., wheelchair or bedside chair)?: A Little Help needed to walk in hospital room?: A Little Help needed climbing 3-5 steps with a railing? : A Lot 6 Click Score: 17    End of Session Equipment Utilized During Treatment: Gait belt Activity Tolerance: Patient limited by  fatigue;Treatment limited secondary to medical complications (Comment) Patient left: in bed;with call bell/phone within reach Nurse Communication: Mobility status PT Visit Diagnosis: Unsteadiness on feet (R26.81);Muscle weakness (generalized) (M62.81);Difficulty in walking, not elsewhere classified (R26.2)    Time: [id number] PT Time Calculation (min) (ACUTE ONLY): 19 min   Charges:   PT Evaluation $PT Eval Moderate Complexity: 1 Mod PT Treatments $Therapeutic Activity: 8-22 mins       3:19 PM, 02/04/21 M. Sherlyn Lees, PT, DPT Physical Therapist- [place] Office Number: [phone]

## 2021-02-04 NOTE — Plan of Care (Signed)
  Problem: Education: Goal: Knowledge of General Education information will improve Description Including pain rating scale, medication(s)/side effects and non-pharmacologic comfort measures Outcome: Progressing   Problem: Health Behavior/Discharge Planning: Goal: Ability to manage health-related needs will improve Outcome: Progressing   

## 2021-02-04 NOTE — H&P (Addendum)
[facility] History and Physical  Stacy Dalton [id number] DOB: [birth year] DOA: 02/04/2021   PCP: [name], FNP  Specialists: None  Chief Complaint: Shortness of breath, generalized weakness, shaking all over  HPI: Stacy Dalton is a 70 y.o. female with a past medical history of essential hypertension, hypothyroidism, alcohol abuse, vitamin B12 deficiency who also has a history of COPD who was in her usual state of health till earlier this morning when she felt like she was having difficulty breathing.  Denies any chest pain.  She drinks alcohol quite heavily, going through multiple bottles of vodka on a weekly basis.  She ran out of alcohol yesterday.  Since then she has been having shaking episodes and tremors.  Denies any seizure activity.  Symptoms got worse this morning when she felt that she was unable to breathe.  After getting medications in the emergency department for her withdrawal symptoms she is feeling better.  She also admitted to nausea and episodes of vomiting overnight.  Denies any abdominal pain.  In the ED she underwent chest x-ray which was unremarkable.  She was noted to be tremulous and tachycardic.  Her symptoms and signs were suggestive of alcohol withdrawal syndrome.  She will need hospitalization for continued management.  Home Medications: Prior to Admission medications   Medication Sig Start Date End Date Taking? Authorizing Provider  amLODipine-olmesartan (AZOR) 10-40 MG tablet Take 1 tablet by mouth daily. 11/30/20   Evelina Dun A, FNP  Aspirin-Caffeine (BC FAST PAIN RELIEF) 845-65 MG PACK Take 1 packet by mouth daily as needed (pain or headache).    [provider]  chlordiazePOXIDE (LIBRIUM) 10 MG capsule Take by mouth. 11/26/20   [provider]  DULoxetine (CYMBALTA) 60 MG capsule Take 1 capsule (60 mg total) by mouth daily. 04/27/20   Hawks, Alyse Low A, FNP  fluticasone furoate-vilanterol (BREO ELLIPTA) 200-25  MCG/INH AEPB Inhale 1 puff into the lungs daily. 01/16/20   Evelina Dun A, FNP  Fluticasone-Salmeterol (ADVAIR) 100-50 MCG/DOSE AEPB Inhale 1 puff into the lungs daily as needed (shortness of breath). 02/09/20   [provider]  folic acid (FOLVITE) 1 MG tablet Take 1 tablet (1 mg total) by mouth daily. 08/11/20   Wyvonnia Dusky, MD  hydrOXYzine (VISTARIL) 25 MG capsule Take 1 capsule (25 mg total) by mouth 3 (three) times daily as needed for anxiety. 08/27/20   [name], FNP  levothyroxine (SYNTHROID) 50 MCG tablet Take 1 tablet (50 mcg total) by mouth daily before breakfast. 06/22/20   Evelina Dun A, FNP  magnesium oxide (MAG-OX) 400 (241.3 Mg) MG tablet Take 1 tablet (400 mg total) by mouth daily. 10/12/20   Lorella Nimrod, MD  pantoprazole (PROTONIX) 40 MG tablet Take 1 tablet (40 mg total) by mouth daily. Patient not taking: No sig reported 10/12/20   Lorella Nimrod, MD  potassium chloride (KLOR-CON) 10 MEQ tablet Take 1 tablet (10 mEq total) by mouth daily. 11/21/20 12/21/20  Wyvonnia Dusky, MD  thiamine 100 MG tablet Take 1 tablet (100 mg total) by mouth daily. Patient not taking: No sig reported 10/12/20   Lorella Nimrod, MD  vitamin B-12 (CYANOCOBALAMIN) 1000 MCG tablet Take 1 tablet (1,000 mcg total) by mouth daily. Patient not taking: No sig reported 10/12/20   Lorella Nimrod, MD  Vitamin D, Ergocalciferol, (DRISDOL) 1.25 MG (50000 UNIT) CAPS capsule Take 1 capsule (50,000 Units total) by mouth every 7 (seven) days. Patient not taking: No sig reported 06/22/20  [name], FNP    Allergies:  Allergies  Allergen Reactions   Librium [Chlordiazepoxide]     Became unresponsive   Toradol [Ketorolac Tromethamine] Shortness Of Breath   Butalbital-Apap-Caffeine Other (See Comments)    jittery   Demerol Swelling   Esgic [Butalbital-Apap-Caffeine] Other (See Comments)    jittery   Iodine Other (See Comments)    bp bottomed out per pt several hours later; unsure if  pre medicated in past with cm; done in [place]    Past Medical History: Past Medical History:  Diagnosis Date   Chronic pain    COPD (chronic obstructive pulmonary disease) ([place])    told has copd, no current inhaler use   Cough    Depression    ETOH abuse    GERD (gastroesophageal reflux disease)    Hypertension    Hypothyroidism    Insomnia    Migraine    Migraine    Osteopenia    Pain management    Panic attacks    Small bowel obstruction ([place])     Past Surgical History:  Procedure Laterality Date   ABDOMINAL HYSTERECTOMY     COLON SURGERY     COLOSTOMY CLOSURE  04/19/2012   Procedure: COLOSTOMY CLOSURE;  Surgeon: Adin Hector, MD;  Location: [facility];  Service: General;  Laterality: N/A;  Laparotomy, Resection and Closure of Colostomy   COLOSTOMY TAKEDOWN N/A 04/12/2016   Procedure: [place] TAKEDOWN;  Surgeon: Johnathan Hausen, MD;  Location: [facility];  Service: General;  Laterality: N/A;   INCONTINENCE SURGERY     LAPAROTOMY  10/04/2011, colostomy also   Procedure: EXPLORATORY LAPAROTOMY;  Surgeon: Adin Hector, MD;  Location: [facility];  Service: General;  Laterality: N/A;  left partial colectomy with colostomy   LAPAROTOMY  04/19/2012   Procedure: EXPLORATORY LAPAROTOMY;  Surgeon: Adin Hector, MD;  Location: [facility];  Service: General;  Laterality: N/A;   LAPAROTOMY N/A 11/29/2015   Procedure: EXPLORATORY LAPAROTOMY; SUBTOTAL COLECTOMY WITH HARTMAN PROCEDURE AND END COLOSTOMY;  Surgeon: Johnathan Hausen, MD;  Location: [facility];  Service: General;  Laterality: N/A;   TUBAL LIGATION     VENTRAL HERNIA REPAIR  04/19/2012   Procedure: HERNIA REPAIR VENTRAL ADULT;  Surgeon: Adin Hector, MD;  Location: [facility];  Service: General;  Laterality: N/A;    Social History: Lives by herself.  Her daughter lives next door.  Drinks multiple bottles of vodka on a weekly basis.  No smoking currently.  No illicit drug use.    Family History:  Family History  Problem Relation Age  of Onset   Heart disease Mother    Hypertension Mother    Cancer Sister        sinus   Diabetes Brother    Heart disease Brother    Thyroid disease Brother    Cancer Sister        abdominal ?   Thyroid disease Sister    Cancer Other        GE junction adenocarcinoma   Emphysema Father    Stroke Father    Hypertension Father    Heart disease Father    COPD Sister    Thyroid disease Sister    Thyroid disease Sister    Diabetes Brother    Thyroid disease Brother    Thyroid disease Brother    Hypertension Brother    Post-traumatic stress disorder Brother    COPD Brother    Thyroid disease Brother  Thyroid disease Brother    Hypertension Brother    Transient ischemic attack Brother      Review of Systems - History obtained from the patient General ROS: positive for  - fatigue and malaise Psychological ROS: positive for - anxiety Ophthalmic ROS: negative ENT ROS: negative Allergy and Immunology ROS: negative Hematological and Lymphatic ROS: negative Endocrine ROS: negative Respiratory ROS: As in HPI Cardiovascular ROS: As in HPI Gastrointestinal ROS: As in HPI Genito-Urinary ROS: no dysuria, trouble voiding, or hematuria Musculoskeletal ROS: Chronic back pain Neurological ROS: no TIA or stroke symptoms Dermatological ROS: negative  Physical Examination  Vitals:   02/04/21 1204 02/04/21 1230 02/04/21 1251 02/04/21 1300  BP: (!) 144/57 140/68 (!) 129/59 (!) 129/59  Pulse: (!) 110 (!) 110 (!) 106 (!) 108  Resp: 19 (!) 27  19  Temp:      TempSrc:      SpO2: 99% 97%  97%  Weight:      Height:        BP (!) 129/59   Pulse (!) 108   Temp 98.4 F (36.9 C) (Oral)   Resp 19   Ht '5\' 1"'$  (1.549 m)   Wt 53.5 kg   SpO2 97%   BMI 22.30 kg/m   General appearance: alert, cooperative, appears stated age, and no distress Head: Normocephalic, without obvious abnormality, atraumatic Eyes: conjunctivae/corneas clear. PERRL, EOM's intact.  Throat: lips, mucosa, and  tongue normal; teeth and gums normal Neck: no adenopathy, no carotid bruit, no JVD, supple, symmetrical, trachea midline, and thyroid not enlarged, symmetric, no tenderness/mass/nodules Resp: clear to auscultation bilaterally Cardio: S1-S2 is tachycardic regular.  No S3-S4.  No rubs murmurs or bruit. GI: soft, non-tender; bowel sounds normal; no masses,  no organomegaly Extremities: extremities normal, atraumatic, no cyanosis or edema Pulses: 2+ and symmetric Skin: Skin color, texture, turgor normal. No rashes or lesions Lymph nodes: Cervical, supraclavicular, and axillary nodes normal. Neurologic: Awake alert.  Oriented x3.  Cranial nerves II to XII intact.  Motor strength symmetrical, 4/5.  Noted to be tremulous.   Labs on Admission: I have personally reviewed following labs and imaging studies  CBC: Recent Labs  Lab 02/04/21 1114  WBC 9.3  NEUTROABS 7.8*  HGB 10.4*  HCT 32.0*  MCV 83.1  PLT 123XX123*   Basic Metabolic Panel: Recent Labs  Lab 02/04/21 1114  NA 133*  K 3.9  CL 100  CO2 17*  GLUCOSE 96  BUN 18  CREATININE 0.81  CALCIUM 8.0*  MG 1.6*   GFR: Estimated Creatinine Clearance: 47.4 mL/min (by C-G formula based on SCr of 0.81 mg/dL). Liver Function Tests: Recent Labs  Lab 02/04/21 1114  AST 70*  ALT 31  ALKPHOS 79  BILITOT 1.3*  PROT 6.7  ALBUMIN 4.3    Anemia Panel: Recent Labs    02/04/21 1118  VITAMINB12 111*     Radiological Exams on Admission: DG Chest Portable 1 View  Result Date: 02/04/2021 CLINICAL DATA:  Shortness of breath EXAM: PORTABLE CHEST 1 VIEW COMPARISON:  11/26/2020 FINDINGS: Unchanged cardiomediastinal silhouette. No new focal airspace disease. No large pleural effusion or visible pneumothorax. No acute osseous abnormality. IMPRESSION: No evidence of acute cardiopulmonary disease. Electronically Signed   By: Maurine Simmering   On: 02/04/2021 12:05    My interpretation of Electrocardiogram: Sinus tachycardia at 103 bpm.  Normal axis.   Intervals are normal.  No concerning ST segment changes.  No concerning T wave changes.   Problem List  Principal  Problem:   Alcohol withdrawal (HCC) Active Problems:   COPD (chronic obstructive pulmonary disease) (HCC)   Chronic back pain   Chronic constipation   Alcohol use disorder, severe, dependence (HCC)   Vitamin B12 deficiency   Assessment: This is a 70 year old Caucasian female with past medical history as stated earlier who comes in with shortness of breath, tremors, anxiety.  She ran out of alcohol yesterday.  She appears to be experiencing symptoms for alcohol withdrawal.  She also admits to having generalized weakness over the past several weeks with difficulty walking which is most likely secondary to her alcoholism and may be also due to vitamin B12 deficiency.  Plan:  #1. Alcohol withdrawal syndrome/Alcohol Abuse: She was counseled on her alcohol intake.  She will be placed on CIWA protocol.  She will be given thiamine IV fluids.  Multivitamins will be provided.  #2. Hyponatremia, hypomagnesemia, anion gap metabolic acidosis: Most of this is secondary to alcoholism.  IV fluids will be provided.  Recheck labs tomorrow.  Magnesium will be repleted.  #3. Normocytic anemia: Longstanding history of same.  No history of overt blood loss.  #4.  Vitamin B12 deficiency: B12 level found to be 111.  This is likely contributing to her paresthesias and generalized weakness and difficulty ambulating.  While she is in the hospital given she will be given parenteral vitamin B12.  We will check folic acid level as well.  #5.  Dyspnea in the setting of history of COPD: Most likely due to alcohol withdrawal syndrome.  Chest x-ray did not show any acute process.  Oxygen saturations are normal.  Continue with her home medications for her COPD.  #6. Chronic back pain: No neurological deficits noted.  Pain medications as needed  #7. Hypothyroidism: Continue with levothyroxine.  #8.   Essential hypertension: Monitor blood pressures closely.  Hold her medication for now since blood pressures are borderline.   DVT Prophylaxis: Lovenox Code Status: Full code Family Communication: Discussed with patient.  No family at bedside Disposition: We will have her evaluated by PT and OT.  May not be surprising if she needs short-term rehabilitation. Consults called: None Admission Status: Status is: Inpatient  Remains inpatient appropriate because:Unsafe d/c plan, IV treatments appropriate due to intensity of illness or inability to take PO, and Inpatient level of care appropriate due to severity of illness  Dispo: The patient is from: Home              Anticipated d/c is to:  To be determined              Patient currently is not medically stable to d/c.   Difficult to place patient No      Severity of Illness: The appropriate patient status for this patient is INPATIENT. Inpatient status is judged to be reasonable and necessary in order to provide the required intensity of service to ensure the patient's safety. The patient's presenting symptoms, physical exam findings, and initial radiographic and laboratory data in the context of their chronic comorbidities is felt to place them at high risk for further clinical deterioration. Furthermore, it is not anticipated that the patient will be medically stable for discharge from the hospital within 2 midnights of admission. The following factors support the patient status of inpatient.   " The patient's presenting symptoms include tremors, dyspnea, nausea and vomiting. " The worrisome physical exam findings include tremors, sinus tachycardia. " The initial radiographic and laboratory data are worrisome because of hyponatremia, hypomagnesemia. "  The chronic co-morbidities include COPD, essential hypertension.   * I certify that at the point of admission it is my clinical judgment that the patient will require inpatient hospital care  spanning beyond 2 midnights from the point of admission due to high intensity of service, high risk for further deterioration and high frequency of surveillance required.*   Further management decisions will depend on results of further testing and patient's response to treatment.   [place] [employer]  [facility] operational officer on [email or web address]  02/04/2021, 1:21 PM

## 2021-02-04 NOTE — ED Notes (Signed)
Pt sleeping. 

## 2021-02-04 NOTE — ED Triage Notes (Signed)
Pt. States they drink a bottle of vodka almost every day. Pt. States they haven't had a drink since yesterday. Pt. States they are having a hard time breathing.

## 2021-02-05 DIAGNOSIS — F418 Other specified anxiety disorders: Secondary | ICD-10-CM

## 2021-02-05 DIAGNOSIS — J42 Unspecified chronic bronchitis: Secondary | ICD-10-CM

## 2021-02-05 DIAGNOSIS — K219 Gastro-esophageal reflux disease without esophagitis: Secondary | ICD-10-CM

## 2021-02-05 LAB — CBC
HCT: 35 % — ABNORMAL LOW (ref 36.0–46.0)
Hemoglobin: 11.3 g/dL — ABNORMAL LOW (ref 12.0–15.0)
MCH: 27.2 pg (ref 26.0–34.0)
MCHC: 32.3 g/dL (ref 30.0–36.0)
MCV: 84.1 fL (ref 80.0–100.0)
Platelets: 103 10*3/uL — ABNORMAL LOW (ref 150–400)
RBC: 4.16 MIL/uL (ref 3.87–5.11)
RDW: 20.1 % — ABNORMAL HIGH (ref 11.5–15.5)
WBC: 5.6 10*3/uL (ref 4.0–10.5)
nRBC: 0 % (ref 0.0–0.2)

## 2021-02-05 LAB — COMPREHENSIVE METABOLIC PANEL
ALT: 26 U/L (ref 0–44)
AST: 55 U/L — ABNORMAL HIGH (ref 15–41)
Albumin: 4.2 g/dL (ref 3.5–5.0)
Alkaline Phosphatase: 87 U/L (ref 38–126)
Anion gap: 11 (ref 5–15)
BUN: 9 mg/dL (ref 8–23)
CO2: 24 mmol/L (ref 22–32)
Calcium: 8.4 mg/dL — ABNORMAL LOW (ref 8.9–10.3)
Chloride: 97 mmol/L — ABNORMAL LOW (ref 98–111)
Creatinine, Ser: 0.59 mg/dL (ref 0.44–1.00)
GFR, Estimated: >60 mL/min (ref >60)
Glucose, Bld: 95 mg/dL (ref 70–99)
Potassium: 3.4 mmol/L — ABNORMAL LOW (ref 3.5–5.1)
Sodium: 132 mmol/L — ABNORMAL LOW (ref 135–145)
Total Bilirubin: 2 mg/dL — ABNORMAL HIGH (ref 0.3–1.2)
Total Protein: 6.9 g/dL (ref 6.5–8.1)

## 2021-02-05 LAB — MAGNESIUM: Magnesium: 1.8 mg/dL (ref 1.7–2.4)

## 2021-02-05 LAB — FOLATE: Folate: 13.6 ng/mL (ref >5.9)

## 2021-02-05 NOTE — Progress Notes (Signed)
PROGRESS NOTE    Stacy Dalton  [id number] DOB: 10-12-1950 DOA: 02/04/2021 PCP: [name], FNP    Chief Complaint  Patient presents with   Alcohol Problem    Brief admission narrative:  Stacy Dalton is a 70 y.o. female with a past medical history of essential hypertension, hypothyroidism, alcohol abuse, vitamin B12 deficiency who also has a history of COPD who was in her usual state of health till earlier this morning when she felt like she was having difficulty breathing.  Denies any chest pain.  She drinks alcohol quite heavily, going through multiple bottles of vodka on a weekly basis.  She ran out of alcohol yesterday.  Since then she has been having shaking episodes and tremors.  Denies any seizure activity.  Symptoms got worse this morning when she felt that she was unable to breathe.  After getting medications in the emergency department for her withdrawal symptoms she is feeling better.  She also admitted to nausea and episodes of vomiting overnight.  Denies any abdominal pain.  In the ED she underwent chest x-ray which was unremarkable.  She was noted to be tremulous and tachycardic.  Her symptoms and signs were suggestive of alcohol withdrawal syndrome.  She will need hospitalization for continued management.   Assessment & Plan: 1-alcohol withdrawal ([place]) -Noncomplicated -Still with elevated CIWA score and intermittently having shaking symptoms and tachycardia -Continue CIWA protocol -Continue thiamine and folic acid. -Side effects to Librium reported in the past.  2-physical deconditioning -Will follow recommendation by PT/OT -Patient demonstrated interest in rehabilitation. -Sound communicated with house and expressed patient no being able to care for herself currently.  3-COPD (chronic obstructive pulmonary disease) (HCC) -No wheezing or use of accessory muscles currently -Continue as needed bronchodilators.  4-history of  depression/anxiety -Continue Cymbalta -Currently receiving as needed lorazepam as per CIWA protocol.  5-hypothyroidism -Continue Synthroid  6-gastroesophageal reflux disease -Continue PPI.  7-history of vitamin D deficiency -Continue high-dose weekly vitamin D supplementation at discharge.  8-hypertension -Blood pressure stable overall -Continue monitoring vital signs.   DVT prophylaxis: Lovenox Code Status: Full code Family Communication: No family at bedside. Disposition:   Status is: Inpatient  Remains inpatient appropriate because:IV treatments appropriate due to intensity of illness or inability to take PO  Dispo:  Patient From: Home  Planned Disposition: [place]  Medically stable for discharge: No       Consultants:  None  Procedures:  See below for x-ray reports.  Antimicrobials:  None   Subjective: Reports feeling short of breath with activity, no fever, no nausea, no vomiting, no chest pain.  Patient expressing to be shaky all over.  Objective: Vitals:   02/05/21 0906 02/05/21 1200 02/05/21 1300 02/05/21 1521  BP:    (!) 128/57  Pulse:  (!) 120 (!) 105 (!) 103  Resp:      Temp:    98.5 F (36.9 C)  TempSrc:    Oral  SpO2: 98%   99%  Weight:      Height:        Intake/Output Summary (Last 24 hours) at 02/05/2021 1748 Last data filed at 02/05/2021 1300 Gross per 24 hour  Intake 1768.88 ml  Output 700 ml  Net 1068.88 ml   Filed Weights   02/04/21 1107 02/04/21 1122  Weight: 53.5 kg 53.5 kg    Examination:  General exam: No chest pain, no nausea, no vomiting.  Reports feeling shaky and weak. Respiratory system: Patient expressing shortness of  breath with exertion; no requiring oxygen supplementation, no using accessory muscles.  No wheezing or crackles on exam. Cardiovascular system: Sinus tachycardia, no rubs, no gallops, no JVD. Gastrointestinal system: Abdomen is nondistended, soft and nontender. No organomegaly or  masses felt. Normal bowel sounds heard. Central nervous system: No focal neurological deficits.  Generally weak. Extremities: No cyanosis or clubbing. Skin: No petechiae. Psychiatry: Mood & affect appropriate.     Data Reviewed: I have personally reviewed following labs and imaging studies  CBC: Recent Labs  Lab 02/04/21 1114 02/05/21 0702  WBC 9.3 5.6  NEUTROABS 7.8*  --   HGB 10.4* 11.3*  HCT 32.0* 35.0*  MCV 83.1 84.1  PLT 122* 103*    Basic Metabolic Panel: Recent Labs  Lab 02/04/21 1114 02/05/21 0702  NA 133* 132*  K 3.9 3.4*  CL 100 97*  CO2 17* 24  GLUCOSE 96 95  BUN 18 9  CREATININE 0.81 0.59  CALCIUM 8.0* 8.4*  MG 1.6* 1.8    GFR: Estimated Creatinine Clearance: 48 mL/min (by C-G formula based on SCr of 0.59 mg/dL).  Liver Function Tests: Recent Labs  Lab 02/04/21 1114 02/05/21 0702  AST 70* 55*  ALT 31 26  ALKPHOS 79 87  BILITOT 1.3* 2.0*  PROT 6.7 6.9  ALBUMIN 4.3 4.2    CBG: No results for input(s): GLUCAP in the last 168 hours.   Recent Results (from the past 240 hour(s))  Resp Panel by RT-PCR (Flu A&B, Covid) Nasopharyngeal Swab     Status: None   Collection Time: 02/04/21  1:21 PM   Specimen: Nasopharyngeal Swab; Nasopharyngeal(NP) swabs in vial transport medium  Result Value Ref Range Status   SARS Coronavirus 2 by RT PCR NEGATIVE NEGATIVE Final    Comment: (NOTE) SARS-CoV-2 target nucleic acids are NOT DETECTED.  The SARS-CoV-2 RNA is generally detectable in upper respiratory specimens during the acute phase of infection. The lowest concentration of SARS-CoV-2 viral copies this assay can detect is 138 copies/mL. A negative result does not preclude SARS-Cov-2 infection and should not be used as the sole basis for treatment or other patient management decisions. A negative result may occur with  improper specimen collection/handling, submission of specimen other than nasopharyngeal swab, presence of viral mutation(s) within  the areas targeted by this assay, and inadequate number of viral copies(<138 copies/mL). A negative result must be combined with clinical observations, patient history, and epidemiological information. The expected result is Negative.  Fact Sheet for Patients:  EntrepreneurPulse.com.au  Fact Sheet for Healthcare Providers:  IncredibleEmployment.be  This test is no t yet approved or cleared by the [place] FDA and  has been authorized for detection and/or diagnosis of SARS-CoV-2 by FDA under an Emergency Use Authorization (EUA). This EUA will remain  in effect (meaning this test can be used) for the duration of the COVID-19 declaration under Section 564(b)(1) of the Act, 21 U.S.C.section 360bbb-3(b)(1), unless the authorization is terminated  or revoked sooner.       Influenza A by PCR NEGATIVE NEGATIVE Final   Influenza B by PCR NEGATIVE NEGATIVE Final    Comment: (NOTE) The Xpert Xpress SARS-CoV-2/FLU/RSV plus assay is intended as an aid in the diagnosis of influenza from Nasopharyngeal swab specimens and should not be used as a sole basis for treatment. Nasal washings and aspirates are unacceptable for Xpert Xpress SARS-CoV-2/FLU/RSV testing.  Fact Sheet for Patients: EntrepreneurPulse.com.au  Fact Sheet for Healthcare Providers: IncredibleEmployment.be  This test is not yet approved or cleared by  the [employer] and has been authorized for detection and/or diagnosis of SARS-CoV-2 by FDA under an Emergency Use Authorization (EUA). This EUA will remain in effect (meaning this test can be used) for the duration of the COVID-19 declaration under Section 564(b)(1) of the Act, 21 U.S.C. section 360bbb-3(b)(1), unless the authorization is terminated or revoked.  Performed at [facility], [address] [place], [place] [address]      Radiology Studies: DG Chest Portable 1 View  Result  Date: 02/04/2021 CLINICAL DATA:  Shortness of breath EXAM: PORTABLE CHEST 1 VIEW COMPARISON:  11/26/2020 FINDINGS: Unchanged cardiomediastinal silhouette. No new focal airspace disease. No large pleural effusion or visible pneumothorax. No acute osseous abnormality. IMPRESSION: No evidence of acute cardiopulmonary disease. Electronically Signed   By: Maurine Simmering   On: 02/04/2021 12:05     Scheduled Meds:  cyanocobalamin  1,000 mcg Intramuscular Daily   DULoxetine  60 mg Oral Daily   enoxaparin (LOVENOX) injection  40 mg Subcutaneous Q24H   fluticasone furoate-vilanterol  1 puff Inhalation Daily   folic acid  1 mg Oral Daily   levothyroxine  50 mcg Oral QAC breakfast   LORazepam  0-4 mg Intravenous Q6H   Followed by   Derrill Memo ON 02/06/2021] LORazepam  0-4 mg Intravenous Q12H   multivitamin with minerals  1 tablet Oral Daily   pantoprazole  40 mg Oral Daily   sodium chloride flush  3 mL Intravenous Q12H   thiamine  100 mg Oral Daily   Or   thiamine  100 mg Intravenous Daily   Continuous Infusions:  sodium chloride Stopped (02/04/21 2123)     LOS: 1 day    Time spent: 35 minutes.    Barton Dubois, MD [facility]   To contact the attending provider between 7A-7P or the covering provider during after hours 7P-7A, please log into the web site [email or web address] and access using universal [place] password for that web site. If you do not have the password, please call the hospital operator.  02/05/2021, 5:48 PM
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
## 2021-02-06 ENCOUNTER — Other Ambulatory Visit: Payer: Self-pay

## 2021-02-06 LAB — COMPREHENSIVE METABOLIC PANEL
ALT: 20 U/L (ref 0–44)
AST: 40 U/L (ref 15–41)
Albumin: 3.6 g/dL (ref 3.5–5.0)
Alkaline Phosphatase: 70 U/L (ref 38–126)
Anion gap: 8 (ref 5–15)
BUN: 6 mg/dL — ABNORMAL LOW (ref 8–23)
CO2: 24 mmol/L (ref 22–32)
Calcium: 8.2 mg/dL — ABNORMAL LOW (ref 8.9–10.3)
Chloride: 99 mmol/L (ref 98–111)
Creatinine, Ser: 0.52 mg/dL (ref 0.44–1.00)
GFR, Estimated: >60 mL/min (ref >60)
Glucose, Bld: 102 mg/dL — ABNORMAL HIGH (ref 70–99)
Potassium: 3.2 mmol/L — ABNORMAL LOW (ref 3.5–5.1)
Sodium: 131 mmol/L — ABNORMAL LOW (ref 135–145)
Total Bilirubin: 1.2 mg/dL (ref 0.3–1.2)
Total Protein: 5.9 g/dL — ABNORMAL LOW (ref 6.5–8.1)

## 2021-02-06 LAB — CBC
HCT: 31.4 % — ABNORMAL LOW (ref 36.0–46.0)
Hemoglobin: 10 g/dL — ABNORMAL LOW (ref 12.0–15.0)
MCH: 26.9 pg (ref 26.0–34.0)
MCHC: 31.8 g/dL (ref 30.0–36.0)
MCV: 84.4 fL (ref 80.0–100.0)
Platelets: 91 10*3/uL — ABNORMAL LOW (ref 150–400)
RBC: 3.72 MIL/uL — ABNORMAL LOW (ref 3.87–5.11)
RDW: 19.3 % — ABNORMAL HIGH (ref 11.5–15.5)
WBC: 4.8 10*3/uL (ref 4.0–10.5)
nRBC: 0 % (ref 0.0–0.2)

## 2021-02-06 MED ORDER — POTASSIUM CHLORIDE CRYS ER 20 MEQ PO TBCR
40.0000 meq | EXTENDED_RELEASE_TABLET | ORAL | Status: AC
Start: 2021-02-06 — End: 2021-02-06
  Administered 2021-02-06 (×2): 40 meq via ORAL
  Filled 2021-02-06 (×2): qty 2

## 2021-02-06 NOTE — Plan of Care (Signed)
  Problem: Education: Goal: Knowledge of General Education information will improve Description: Including pain rating scale, medication(s)/side effects and non-pharmacologic comfort measures Outcome: Progressing   Problem: Clinical Measurements: Goal: Ability to maintain clinical measurements within normal limits will improve Outcome: Progressing Goal: Will remain free from infection Outcome: Progressing Goal: Diagnostic test results will improve Outcome: Progressing   Problem: Activity: Goal: Risk for activity intolerance will decrease Outcome: Progressing   

## 2021-02-06 NOTE — TOC Progression Note (Signed)
Transition of Care [facility]) - Progression Note    Patient Details  Name: Stacy Dalton MRN: [id number] Date of Birth: 10-20-1950  Transition of Care [facility] [place]) CM/SW Contact  Natasha Bence, LCSW Phone Number: 02/06/2021, 2:06 PM  Clinical Narrative:    CSW notified of patient's need for SA resources. CSW provided SA resources for patient. Patient agreeable to review list. TOC to follow.        Expected Discharge Plan and Services                                                 Social Determinants of Health (SDOH) Interventions    Readmission Risk Interventions No flowsheet data found.

## 2021-02-06 NOTE — Progress Notes (Signed)
PROGRESS NOTE    Stacy Dalton  [id number] DOB: Sep 17, 1950 DOA: 02/04/2021 PCP: [name], FNP    Chief Complaint  Patient presents with   Alcohol Problem    Brief admission narrative:  Stacy Dalton is a 70 y.o. female with a past medical history of essential hypertension, hypothyroidism, alcohol abuse, vitamin B12 deficiency who also has a history of COPD who was in her usual state of health till earlier this morning when she felt like she was having difficulty breathing.  Denies any chest pain.  She drinks alcohol quite heavily, going through multiple bottles of vodka on a weekly basis.  She ran out of alcohol yesterday.  Since then she has been having shaking episodes and tremors.  Denies any seizure activity.  Symptoms got worse this morning when she felt that she was unable to breathe.  After getting medications in the emergency department for her withdrawal symptoms she is feeling better.  She also admitted to nausea and episodes of vomiting overnight.  Denies any abdominal pain.  In the ED she underwent chest x-ray which was unremarkable.  She was noted to be tremulous and tachycardic.  Her symptoms and signs were suggestive of alcohol withdrawal syndrome.  She will need hospitalization for continued management.   Assessment & Plan: 1-alcohol withdrawal ([place]) -Noncomplicated -Still with elevated CIWA score and intermittently having shaking symptoms and tachycardia -Continue CIWA protocol -Continue thiamine and folic acid. -Side effects to Librium reported in the past.  2-physical deconditioning -Will follow recommendation by PT/OT -Patient demonstrated interest in rehabilitation. -Sound communicated with house and expressed patient no being able to care for herself currently.  3-COPD (chronic obstructive pulmonary disease) (HCC) -No wheezing or use of accessory muscles currently -Continue as needed bronchodilators.  4-history of  depression/anxiety -Continue Cymbalta -Currently receiving as needed lorazepam as per CIWA protocol.  5-hypothyroidism -Continue Synthroid  6-gastroesophageal reflux disease -Continue PPI.  7-history of vitamin D deficiency -Continue high-dose weekly vitamin D supplementation at discharge.  8-hypertension -Blood pressure stable overall -Continue monitoring vital signs.  9-hypokalemia/hypomagnesemia -Magnesium is repleted -We will continue potassium repletion -Follow electrolytes trend. -Continue telemetry monitoring.   DVT prophylaxis: Lovenox Code Status: Full code Family Communication: No family at bedside. Disposition:   Status is: Inpatient  Remains inpatient appropriate because:IV treatments appropriate due to intensity of illness or inability to take PO  Dispo:  Patient From: Home  Planned Disposition: [place]  Medically stable for discharge: No       Consultants:  None  Procedures:  See below for x-ray reports.  Antimicrobials:  None   Subjective: Feeling weak, tired, deconditioned and shaky.  Patient denies chest pain, no nausea, no vomiting.  Currently afebrile.  Objective: Vitals:   02/05/21 1521 02/05/21 2200 02/06/21 0614 02/06/21 1446  BP: (!) 128/57 (!) 152/88 (!) 143/97 (!) 141/88  Pulse: (!) 103 90 93 86  Resp:  18 16   Temp: 98.5 F (36.9 C) 98.6 F (37 C) 98.1 F (36.7 C)   TempSrc: Oral Oral Oral   SpO2: 99% 98% 99% 96%  Weight:      Height:        Intake/Output Summary (Last 24 hours) at 02/06/2021 1746 Last data filed at 02/06/2021 0831 Gross per 24 hour  Intake 0 ml  Output --  Net 0 ml   Filed Weights   02/04/21 1107 02/04/21 1122  Weight: 53.5 kg 53.5 kg    Examination: General exam: Alert, awake, cooperative to  examination and in no acute distress.  Reports feeling shaky and sometimes finding difficulty finding words.   Respiratory system: Clear to auscultation. Respiratory effort normal. Good sat  on RA. Cardiovascular system:RRR. No murmurs, rubs, gallops. No JVD Gastrointestinal system: Abdomen is nondistended, soft and nontender. No organomegaly or masses felt. Normal bowel sounds heard. Central nervous system: Alert and oriented. No focal neurological deficits. Extremities: No cyanosis, no clubbing. Skin: No rashes, no petechiae. Psychiatry: Mood & affect appropriate.     Data Reviewed: I have personally reviewed following labs and imaging studies  CBC: Recent Labs  Lab 02/04/21 1114 02/05/21 0702 02/06/21 0706  WBC 9.3 5.6 4.8  NEUTROABS 7.8*  --   --   HGB 10.4* 11.3* 10.0*  HCT 32.0* 35.0* 31.4*  MCV 83.1 84.1 84.4  PLT 122* 103* 91*    Basic Metabolic Panel: Recent Labs  Lab 02/04/21 1114 02/05/21 0702 02/06/21 0706  NA 133* 132* 131*  K 3.9 3.4* 3.2*  CL 100 97* 99  CO2 17* 24 24  GLUCOSE 96 95 102*  BUN 18 9 6*  CREATININE 0.81 0.59 0.52  CALCIUM 8.0* 8.4* 8.2*  MG 1.6* 1.8  --     GFR: Estimated Creatinine Clearance: 48 mL/min (by C-G formula based on SCr of 0.52 mg/dL).  Liver Function Tests: Recent Labs  Lab 02/04/21 1114 02/05/21 0702 02/06/21 0706  AST 70* 55* 40  ALT '31 26 20  '$ ALKPHOS 79 87 70  BILITOT 1.3* 2.0* 1.2  PROT 6.7 6.9 5.9*  ALBUMIN 4.3 4.2 3.6    CBG: No results for input(s): GLUCAP in the last 168 hours.  Recent Results (from the past 240 hour(s))  Resp Panel by RT-PCR (Flu A&B, Covid) Nasopharyngeal Swab     Status: None   Collection Time: 02/04/21  1:21 PM   Specimen: Nasopharyngeal Swab; Nasopharyngeal(NP) swabs in vial transport medium  Result Value Ref Range Status   SARS Coronavirus 2 by RT PCR NEGATIVE NEGATIVE Final    Comment: (NOTE) SARS-CoV-2 target nucleic acids are NOT DETECTED.  The SARS-CoV-2 RNA is generally detectable in upper respiratory specimens during the acute phase of infection. The lowest concentration of SARS-CoV-2 viral copies this assay can detect is 138 copies/mL. A negative  result does not preclude SARS-Cov-2 infection and should not be used as the sole basis for treatment or other patient management decisions. A negative result may occur with  improper specimen collection/handling, submission of specimen other than nasopharyngeal swab, presence of viral mutation(s) within the areas targeted by this assay, and inadequate number of viral copies(<138 copies/mL). A negative result must be combined with clinical observations, patient history, and epidemiological information. The expected result is Negative.  Fact Sheet for Patients:  EntrepreneurPulse.com.au  Fact Sheet for Healthcare Providers:  IncredibleEmployment.be  This test is no t yet approved or cleared by the [place] FDA and  has been authorized for detection and/or diagnosis of SARS-CoV-2 by FDA under an Emergency Use Authorization (EUA). This EUA will remain  in effect (meaning this test can be used) for the duration of the COVID-19 declaration under Section 564(b)(1) of the Act, 21 U.S.C.section 360bbb-3(b)(1), unless the authorization is terminated  or revoked sooner.       Influenza A by PCR NEGATIVE NEGATIVE Final   Influenza B by PCR NEGATIVE NEGATIVE Final    Comment: (NOTE) The Xpert Xpress SARS-CoV-2/FLU/RSV plus assay is intended as an aid in the diagnosis of influenza from Nasopharyngeal swab specimens and should not  be used as a sole basis for treatment. Nasal washings and aspirates are unacceptable for Xpert Xpress SARS-CoV-2/FLU/RSV testing.  Fact Sheet for Patients: EntrepreneurPulse.com.au  Fact Sheet for Healthcare Providers: IncredibleEmployment.be  This test is not yet approved or cleared by the [place] FDA and has been authorized for detection and/or diagnosis of SARS-CoV-2 by FDA under an Emergency Use Authorization (EUA). This EUA will remain in effect (meaning this test can be used)  for the duration of the COVID-19 declaration under Section 564(b)(1) of the Act, 21 U.S.C. section 360bbb-3(b)(1), unless the authorization is terminated or revoked.  Performed at [facility], [address] [place], [place] [address]      Radiology Studies: No results found.  Scheduled Meds:  cyanocobalamin  1,000 mcg Intramuscular Daily   DULoxetine  60 mg Oral Daily   enoxaparin (LOVENOX) injection  40 mg Subcutaneous Q24H   fluticasone furoate-vilanterol  1 puff Inhalation Daily   folic acid  1 mg Oral Daily   levothyroxine  50 mcg Oral QAC breakfast   LORazepam  0-4 mg Intravenous Q12H   multivitamin with minerals  1 tablet Oral Daily   pantoprazole  40 mg Oral Daily   sodium chloride flush  3 mL Intravenous Q12H   thiamine  100 mg Oral Daily   Or   thiamine  100 mg Intravenous Daily   Continuous Infusions:  sodium chloride Stopped (02/04/21 2123)     LOS: 2 days    Time spent: 30 minutes.   Barton Dubois, MD [facility]   To contact the attending provider between 7A-7P or the covering provider during after hours 7P-7A, please log into the web site [email or web address] and access using universal [place] password for that web site. If you do not have the password, please call the hospital operator.  02/06/2021, 5:46 PM

## 2021-02-07 DIAGNOSIS — E039 Hypothyroidism, unspecified: Secondary | ICD-10-CM

## 2021-02-07 DIAGNOSIS — R5381 Other malaise: Secondary | ICD-10-CM

## 2021-02-07 LAB — COMPREHENSIVE METABOLIC PANEL
ALT: 26 U/L (ref 0–44)
AST: 58 U/L — ABNORMAL HIGH (ref 15–41)
Albumin: 3.7 g/dL (ref 3.5–5.0)
Alkaline Phosphatase: 73 U/L (ref 38–126)
Anion gap: 5 (ref 5–15)
BUN: 7 mg/dL — ABNORMAL LOW (ref 8–23)
CO2: 26 mmol/L (ref 22–32)
Calcium: 8.5 mg/dL — ABNORMAL LOW (ref 8.9–10.3)
Chloride: 101 mmol/L (ref 98–111)
Creatinine, Ser: 0.64 mg/dL (ref 0.44–1.00)
GFR, Estimated: >60 mL/min (ref >60)
Glucose, Bld: 93 mg/dL (ref 70–99)
Potassium: 4.7 mmol/L (ref 3.5–5.1)
Sodium: 132 mmol/L — ABNORMAL LOW (ref 135–145)
Total Bilirubin: 0.9 mg/dL (ref 0.3–1.2)
Total Protein: 6 g/dL — ABNORMAL LOW (ref 6.5–8.1)

## 2021-02-07 LAB — MAGNESIUM: Magnesium: 1.4 mg/dL — ABNORMAL LOW (ref 1.7–2.4)

## 2021-02-07 MED ORDER — ADULT MULTIVITAMIN W/MINERALS CH: 1.0000 | ORAL_TABLET | Freq: Every day | ORAL | Status: DC

## 2021-02-07 MED ORDER — MAGNESIUM OXIDE 400 (241.3 MG) MG PO TABS: 400.0000 mg | ORAL_TABLET | Freq: Two times a day (BID) | ORAL | 0 refills | Status: DC

## 2021-02-07 MED ORDER — CHLORDIAZEPOXIDE HCL 10 MG PO CAPS: ORAL_CAPSULE | ORAL | 0 refills | Status: DC

## 2021-02-07 MED ORDER — PANTOPRAZOLE SODIUM 40 MG PO TBEC: 40.0000 mg | DELAYED_RELEASE_TABLET | Freq: Every day | ORAL | 1 refills | Status: DC

## 2021-02-07 MED ORDER — CYANOCOBALAMIN 1000 MCG/ML IJ SOLN: INTRAMUSCULAR | 0 refills | Status: DC

## 2021-02-07 NOTE — Progress Notes (Signed)
Patient provided with discharge education and materials, verbalized understanding. IV removed, no issues noted. Patient discharged from unit with all belongings.

## 2021-02-07 NOTE — Progress Notes (Signed)
Physical Therapy Treatment Patient Details Name: Stacy Dalton MRN: [id number] DOB: 01-22-1951 Today's Date: 02/07/2021    History of Present Illness Stacy Dalton is a 70 y.o. female with a past medical history of essential hypertension, hypothyroidism, alcohol abuse, vitamin B12 deficiency who also has a history of COPD who was in her usual state of health till she felt like she was having difficulty breathing.  Denies any chest pain.  She drinks alcohol quite heavily, going through multiple bottles of vodka on a weekly basis.  She ran out of alcohol yesterday.  Since then she has been having shaking episodes and tremors.  Denies any seizure activity.  Symptoms got worse in the morning when she felt that she was unable to breathe.  After getting medications in the emergency department for her withdrawal symptoms she is feeling better.  She also admitted to nausea and episodes of vomiting overnight.  Denies any abdominal pain.    In the ED she underwent chest x-ray which was unremarkable.  She was noted to be tremulous and tachycardic.  Her symptoms and signs were suggestive of alcohol withdrawal syndrome.  She will need hospitalization for continued management.    PT Comments    Patient agreeable to participating in therapy today. Patient performed bed mobility modified independent. Patient exhibited difficulty with transfers requiring increased time to power up; verbal and gestural cues for sequencing of steps and placement of hands with use of RW. Patient exhibited slow, labored, fairly steady gait with short steps and heavy use of upper extremities with RW. Patient complained of back hurting "all the way down and legs feeling like they were going to give out". Patient limited by pain and fatigue. Patient on room air throughout session.  Patient would continue to benefit from skilled physical therapy in current environment and next venue to continue return to prior function and increase  strength, endurance, balance, coordination, and functional mobility and gait skills.    Follow Up Recommendations  Home health PT;Supervision - Intermittent     Equipment Recommendations  None recommended by PT (pt reports she owns RW at home)    Recommendations for Other Services       Precautions / Restrictions Precautions Precautions: Fall Restrictions Weight Bearing Restrictions: No    Mobility  Bed Mobility Overal bed mobility: Needs Assistance Bed Mobility: Sit to Supine     Supine to sit: Supervision Sit to supine: Supervision   General bed mobility comments: increased time; patient in chair at beginning of session    Transfers Overall transfer level: Needs assistance Equipment used: Rolling walker (2 wheeled) Transfers: Sit to/from [facility] Sit to Stand: Min guard Stand pivot transfers: Min guard       General transfer comment: increased time to power up; verbal and gestural cues for sequencing of steps and placement of hands with use of RW  Ambulation/Gait Ambulation/Gait assistance: Min guard Gait Distance (Feet): 80 Feet Assistive device: Rolling walker (2 wheeled) Gait Pattern/deviations: Step-through pattern;Decreased step length - right;Decreased step length - left;Trunk flexed;Narrow base of support;Decreased stride length Gait velocity: decreased   General Gait Details: slow, labored, fairly steady gait with short steps and heavy use of upper extremities with RW; complained of back hurting all the way down and legs feeling like they were going to give out; limited by fatigue; on room air   Stairs             Wheelchair Mobility    Modified Rankin (Stroke Patients  Only)       Balance Overall balance assessment: Needs assistance Sitting-balance support: No upper extremity supported;Feet unsupported Sitting balance-Leahy Scale: Good     Standing balance support: Bilateral upper extremity supported;During  functional activity Standing balance-Leahy Scale: Fair Standing balance comment: with RW                            Cognition Arousal/Alertness: Awake/alert Behavior During Therapy: WFL for tasks assessed/performed Overall Cognitive Status: Within Functional Limits for tasks assessed                                        Exercises      General Comments        Pertinent Vitals/Pain Pain Assessment: 0-10 Pain Score: 9  Pain Location: back Pain Descriptors / Indicators: Aching;Sore Pain Intervention(s): Limited activity within patient's tolerance;Monitored during session;Repositioned    Home Living Family/patient expects to be discharged to:: Private residence Living Arrangements: Alone Available Help at Discharge: Family;Available PRN/intermittently (Daughter lives next door although she hasn't been providing much help recently per patient.) Type of Home: House Home Access: Stairs to enter Entrance Stairs-Rails: None Home Layout: One level Home Equipment: Environmental consultant - 2 wheels      Prior Function Level of Independence: Independent with assistive device(s)          PT Goals (current goals can now be found in the care plan section) Acute Rehab PT Goals Patient Stated Goal: return home when able PT Goal Formulation: With patient Time For Goal Achievement: 02/11/21 Potential to Achieve Goals: Good Progress towards PT goals: Progressing toward goals    Frequency    Min 3X/week      PT Plan Current plan remains appropriate       AM-PAC PT "6 Clicks" Mobility   Outcome Measure  Help needed turning from your back to your side while in a flat bed without using bedrails?: A Little Help needed moving from lying on your back to sitting on the side of a flat bed without using bedrails?: A Little Help needed moving to and from a bed to a chair (including a wheelchair)?: A Little Help needed standing up from a chair using your arms (e.g.,  wheelchair or bedside chair)?: A Little Help needed to walk in hospital room?: A Little Help needed climbing 3-5 steps with a railing? : A Lot 6 Click Score: 17    End of Session Equipment Utilized During Treatment: Gait belt Activity Tolerance: Patient limited by fatigue;Treatment limited secondary to medical complications (Comment) Patient left: in bed;with call bell/phone within reach Nurse Communication: Mobility status PT Visit Diagnosis: Unsteadiness on feet (R26.81);Muscle weakness (generalized) (M62.81);Difficulty in walking, not elsewhere classified (R26.2)     Time: 1125-1150 PT Time Calculation (min) (ACUTE ONLY): 25 min  Charges:  $Therapeutic Activity: 23-37 mins                     Floria Raveling. Hartnett-Rands, MS, PT Per [place] [phone]  Pamala Hurry  Hartnett-Rands 02/07/2021, 1:15 PM

## 2021-02-07 NOTE — Discharge Summary (Signed)
Physician Discharge Summary  Stacy Dalton [id number] DOB: 16-Apr-1951 DOA: 02/04/2021  PCP: [name], FNP  Admit date: 02/04/2021 Discharge date: 02/07/2021  Time spent: 35 minutes  Recommendations for Outpatient Follow-up:  Reassess blood pressure and adjust antihypertensive regimen Repeat basic metabolic panel and magnesium level to assess electrolytes stability Continue assisting patient with alcohol cessation Repeat B12 level in 12 weeks.   Discharge Diagnoses:  Principal Problem:   Alcohol withdrawal ([place]) Active Problems:   COPD (chronic obstructive pulmonary disease) (HCC)   Chronic back pain   Chronic constipation   Alcohol use disorder, severe, dependence (HCC)   Vitamin B12 deficiency Essential hypertension.  Discharge Condition: Stable and improved.  Discharged home with instruction to follow-up with PCP in 1 week.  CODE STATUS: Full code.  Diet recommendation: Heart healthy diet.  Filed Weights   02/04/21 1107 02/04/21 1122  Weight: 53.5 kg 53.5 kg    History of present illness:  Stacy Dalton is a 70 y.o. female with a past medical history of essential hypertension, hypothyroidism, alcohol abuse, vitamin B12 deficiency who also has a history of COPD who was in her usual state of health till earlier this morning when she felt like she was having difficulty breathing.  Denies any chest pain.  She drinks alcohol quite heavily, going through multiple bottles of vodka on a weekly basis.  She ran out of alcohol yesterday.  Since then she has been having shaking episodes and tremors.  Denies any seizure activity.  Symptoms got worse this morning when she felt that she was unable to breathe.  After getting medications in the emergency department for her withdrawal symptoms she is feeling better.  She also admitted to nausea and episodes of vomiting overnight.  Denies any abdominal pain.  In the ED she underwent chest x-ray which was unremarkable.  She was  noted to be tremulous and tachycardic.  Her symptoms and signs were suggestive of alcohol withdrawal syndrome.  She will need hospitalization for continued management.  Hospital Course:  1-alcohol withdrawal ([place]) -Noncomplicated -No significant withdrawal symptoms and normal CIWA score at discharge. -Continue thiamine and folic acid. -Patient expressed being able to tolerate Librium and has been discharged with a specific tapering orders.   2-physical deconditioning -Physical therapy has evaluated patient and is recommending home health services. -Patient demonstrated interest in rehabilitation for alcohol cessation. -TOC to assist with home health services arrangements.   3-COPD (chronic obstructive pulmonary disease) (HCC) -No wheezing or use of accessory muscles currently -Continue home as needed bronchodilators.   4-history of depression/anxiety -Continue Cymbalta   5-hypothyroidism -Continue Synthroid   6-gastroesophageal reflux disease -Continue PPI.   7-history of vitamin D deficiency -Continue high-dose weekly vitamin D supplementation at discharge.   8-hypertension -Stable and rising after fluid resuscitation -Patient will resume home antihypertensive agents -Repeat vital signs and further adjust medications as required during follow-up visit.   9-hypokalemia/hypomagnesemia -Magnesium has been repleted and patient discharge on oral maintenance supplementation. -Potassium has been repleted and within normal limits at discharge. -Receiving home potassium sparing antihypertension meds. -Advised to maintain adequate nutrition and hydration and to stop drinking alcohol.  10-B12 deficiency -Intramuscular repletion initiated -Repeat B12 level in 12 weeks.  Procedures: See below for x-ray reports. Consultations: None  Discharge Exam: Vitals:   02/07/21 0535 02/07/21 0736  BP: (!) 162/90   Pulse: 87   Resp: 18   Temp: 97.7 F (36.5 C)   SpO2: 96% 95%     General: Afebrile,  no chest pain, no nausea, no vomiting.  Feeling significantly better and would like to go home.  Still weak and complaining of chronic back pain; but having able to ambulate and found to be safe for home health with PT. Cardiovascular: Rate controlled, no rubs, no gallops, no JVD Respiratory: Good air movement bilaterally; no using accessory muscle.  Good saturation on room air Abdomen: Soft, nontender, distended, positive bowel sounds Extremities: No cyanosis or clubbing.  Discharge Instructions   Discharge Instructions     Diet - low sodium heart healthy   Complete by: As directed    Discharge instructions   Complete by: As directed    Take medications as prescribed Maintain adequate hydration Stop alcohol consumption Follow heart healthy diet Arrange follow-up with PCP in 10 days.      Allergies as of 02/07/2021       Reactions   Librium [chlordiazepoxide]    Became unresponsive   Toradol [ketorolac Tromethamine] Shortness Of Breath   Butalbital-apap-caffeine Other (See Comments)   jittery   Demerol Swelling   Esgic [butalbital-apap-caffeine] Other (See Comments)   jittery   Iodine Other (See Comments)   bp bottomed out per pt several hours later; unsure if pre medicated in past with cm; done in [place]        Medication List     STOP taking these medications    Fluticasone-Salmeterol 100-50 MCG/DOSE Aepb Commonly known as: ADVAIR   hydrOXYzine 25 MG capsule Commonly known as: Vistaril   ondansetron 4 MG tablet Commonly known as: ZOFRAN   potassium chloride 10 MEQ tablet Commonly known as: KLOR-CON   vitamin B-12 1000 MCG tablet Commonly known as: CYANOCOBALAMIN Replaced by: cyanocobalamin 1000 MCG/ML injection       TAKE these medications    amLODipine-olmesartan 10-40 MG tablet Commonly known as: AZOR Take 1 tablet by mouth daily.   BC Fast Pain Relief 845-65 MG Pack Generic drug: Aspirin-Caffeine Take 1 packet by mouth  daily as needed (pain or headache).   Breo Ellipta 200-25 MCG/INH Aepb Generic drug: fluticasone furoate-vilanterol Inhale 1 puff into the lungs daily.   chlordiazePOXIDE 10 MG capsule Commonly known as: LIBRIUM Take 3 tablets by mouth daily X2 days; then 2 tablets by mouth daily X 2 days, then 1 tablet by mouth daily X 3 days and stop librium. What changed:  how to take this additional instructions   cyanocobalamin 1000 MCG/ML injection Commonly known as: (VITAMIN B-12) Inject 1000 mcg IM daily X 5 days; then weekly for a month and then monthly. Replaces: vitamin B-12 1000 MCG tablet   DULoxetine 60 MG capsule Commonly known as: CYMBALTA Take 1 capsule (60 mg total) by mouth daily.   folic acid 1 MG tablet Commonly known as: FOLVITE Take 1 tablet (1 mg total) by mouth daily.   levothyroxine 50 MCG tablet Commonly known as: Synthroid Take 1 tablet (50 mcg total) by mouth daily before breakfast.   magnesium oxide 400 (241.3 Mg) MG tablet Commonly known as: MAG-OX Take 1 tablet (400 mg total) by mouth 2 (two) times daily. What changed: when to take this   multivitamin with minerals Tabs tablet Take 1 tablet by mouth daily. Start taking on: February 08, 2021   pantoprazole 40 MG tablet Commonly known as: PROTONIX Take 1 tablet (40 mg total) by mouth daily.   thiamine 100 MG tablet Take 1 tablet (100 mg total) by mouth daily.   Vitamin D (Ergocalciferol) 1.25 MG (50000 UNIT) Caps capsule Commonly  known as: DRISDOL Take 1 capsule (50,000 Units total) by mouth every 7 (seven) days.               Durable Medical Equipment  (From admission, onward)           Start     Ordered   02/07/21 1159  For home use only DME Bedside commode  Once       Question:  Patient needs a bedside commode to treat with the following condition  Answer:  Physical deconditioning   02/07/21 1201   02/07/21 1159  For home use only DME Shower stool  Once        02/07/21 1201    02/07/21 1158  For home use only DME Walker rolling  Once       Question Answer Comment  Walker: With [place]   Patient needs a walker to treat with the following condition Physical deconditioning      02/07/21 1201           Allergies  Allergen Reactions   Librium [Chlordiazepoxide]     Became unresponsive   Toradol [Ketorolac Tromethamine] Shortness Of Breath   Butalbital-Apap-Caffeine Other (See Comments)    jittery   Demerol Swelling   Esgic [Butalbital-Apap-Caffeine] Other (See Comments)    jittery   Iodine Other (See Comments)    bp bottomed out per pt several hours later; unsure if pre medicated in past with cm; done in [place]    The results of significant diagnostics from this hospitalization (including imaging, microbiology, ancillary and laboratory) are listed below for reference.    Significant Diagnostic Studies: CT Head Wo Contrast  Result Date: 01/18/2021 CLINICAL DATA:  Altered mental status, intoxication EXAM: CT HEAD WITHOUT CONTRAST TECHNIQUE: Contiguous axial images were obtained from the base of the skull through the vertex without intravenous contrast. COMPARISON:  11/24/2020 FINDINGS: Brain: Normal anatomic configuration. No abnormal intra or extra-axial mass lesion or fluid collection. No abnormal mass effect or midline shift. No evidence of acute intracranial hemorrhage or infarct. Ventricular size is normal. Cerebellum unremarkable. Vascular: Unremarkable Skull: Intact Sinuses/Orbits: Paranasal sinuses are clear. Orbits are unremarkable. Other: Mastoid air cells and middle ear cavities are clear. There is soft tissue swelling superficial to the left supraorbital ridge and left frontal bone. IMPRESSION: No acute intracranial abnormality. No calvarial fracture. Left frontal soft tissue swelling. Electronically Signed   By: Fidela Salisbury MD   On: 01/18/2021 20:53   DG Chest Portable 1 View  Result Date: 02/04/2021 CLINICAL DATA:  Shortness of breath EXAM:  PORTABLE CHEST 1 VIEW COMPARISON:  11/26/2020 FINDINGS: Unchanged cardiomediastinal silhouette. No new focal airspace disease. No large pleural effusion or visible pneumothorax. No acute osseous abnormality. IMPRESSION: No evidence of acute cardiopulmonary disease. Electronically Signed   By: Maurine Simmering   On: 02/04/2021 12:05    Microbiology: Recent Results (from the past 240 hour(s))  Resp Panel by RT-PCR (Flu A&B, Covid) Nasopharyngeal Swab     Status: None   Collection Time: 02/04/21  1:21 PM   Specimen: Nasopharyngeal Swab; Nasopharyngeal(NP) swabs in vial transport medium  Result Value Ref Range Status   SARS Coronavirus 2 by RT PCR NEGATIVE NEGATIVE Final    Comment: (NOTE) SARS-CoV-2 target nucleic acids are NOT DETECTED.  The SARS-CoV-2 RNA is generally detectable in upper respiratory specimens during the acute phase of infection. The lowest concentration of SARS-CoV-2 viral copies this assay can detect is 138 copies/mL. A negative result does not  preclude SARS-Cov-2 infection and should not be used as the sole basis for treatment or other patient management decisions. A negative result may occur with  improper specimen collection/handling, submission of specimen other than nasopharyngeal swab, presence of viral mutation(s) within the areas targeted by this assay, and inadequate number of viral copies(<138 copies/mL). A negative result must be combined with clinical observations, patient history, and epidemiological information. The expected result is Negative.  Fact Sheet for Patients:  EntrepreneurPulse.com.au  Fact Sheet for Healthcare Providers:  IncredibleEmployment.be  This test is no t yet approved or cleared by the [place] FDA and  has been authorized for detection and/or diagnosis of SARS-CoV-2 by FDA under an Emergency Use Authorization (EUA). This EUA will remain  in effect (meaning this test can be used) for the duration  of the COVID-19 declaration under Section 564(b)(1) of the Act, 21 U.S.C.section 360bbb-3(b)(1), unless the authorization is terminated  or revoked sooner.       Influenza A by PCR NEGATIVE NEGATIVE Final   Influenza B by PCR NEGATIVE NEGATIVE Final    Comment: (NOTE) The Xpert Xpress SARS-CoV-2/FLU/RSV plus assay is intended as an aid in the diagnosis of influenza from Nasopharyngeal swab specimens and should not be used as a sole basis for treatment. Nasal washings and aspirates are unacceptable for Xpert Xpress SARS-CoV-2/FLU/RSV testing.  Fact Sheet for Patients: EntrepreneurPulse.com.au  Fact Sheet for Healthcare Providers: IncredibleEmployment.be  This test is not yet approved or cleared by the [place] FDA and has been authorized for detection and/or diagnosis of SARS-CoV-2 by FDA under an Emergency Use Authorization (EUA). This EUA will remain in effect (meaning this test can be used) for the duration of the COVID-19 declaration under Section 564(b)(1) of the Act, 21 U.S.C. section 360bbb-3(b)(1), unless the authorization is terminated or revoked.  Performed at [facility], [address] [place], [place] [address]      Labs: Basic Metabolic Panel: Recent Labs  Lab 02/04/21 1114 02/05/21 0702 02/06/21 0706 02/07/21 0631  NA 133* 132* 131* 132*  K 3.9 3.4* 3.2* 4.7  CL 100 97* 99 101  CO2 17* '24 24 26  '$ GLUCOSE 96 95 102* 93  BUN 18 9 6* 7*  CREATININE 0.81 0.59 0.52 0.64  CALCIUM 8.0* 8.4* 8.2* 8.5*  MG 1.6* 1.8  --  1.4*   Liver Function Tests: Recent Labs  Lab 02/04/21 1114 02/05/21 0702 02/06/21 0706 02/07/21 0631  AST 70* 55* 40 58*  ALT '31 26 20 26  '$ ALKPHOS 79 87 70 73  BILITOT 1.3* 2.0* 1.2 0.9  PROT 6.7 6.9 5.9* 6.0*  ALBUMIN 4.3 4.2 3.6 3.7   CBC: Recent Labs  Lab 02/04/21 1114 02/05/21 0702 02/06/21 0706  WBC 9.3 5.6 4.8  NEUTROABS 7.8*  --   --   HGB 10.4* 11.3* 10.0*  HCT 32.0* 35.0*  31.4*  MCV 83.1 84.1 84.4  PLT 122* 103* 91*    Signed:  Barton Dubois MD.  [facility] 02/07/2021, 12:50 PM

## 2021-02-07 NOTE — TOC Transition Note (Signed)
Transition of Care [facility]) - CM/SW Discharge Note   Patient Details  Name: ORELIA SPERBECK MRN: [id number] Date of Birth: December 23, 1950  Transition of Care [facility]) CM/SW Contact:  Natasha Bence, LCSW Phone Number: 02/07/2021, 2:37 PM   Clinical Narrative:    CSW notified of patient's readiness for discharge. CSW notified DSS of patient's return home to conduct a home visit. CSW also received recommendations for DME and HH. CSW referred patient to [place] with Alvis Lemmings and Caryl Pina with [employer]. Georgina Snell agreeable to provide [facility]. Caryl Pina agreeable to provide Bsc, shower chair, and rw. TOC signing off.   Final next level of care: [place] Barriers to Discharge: Barriers Resolved   Patient Goals and CMS Choice Patient states their goals for this hospitalization and ongoing recovery are:: Return home with [facility] CMS Medicare.gov Compare Post Acute Care list provided to:: Patient Choice offered to / list presented to : Patient  Discharge Placement                    Patient and family notified of of transfer: 02/07/21  Discharge Plan and Services                DME Arranged: Gilford Rile rolling, Shower stool, Bedside commode DME Agency: [employer] Date DME Agency Contacted: 02/07/21 Time DME Agency Contacted: [id number] Representative spoke with at DME Agency: [place]: OT, PT [place] Agency: [place] Date [place]: 02/07/21 Time [place]: 1100 Representative spoke with at [place]: [place] ([place]) Interventions     Readmission Risk Interventions No flowsheet data found.

## 2021-02-07 NOTE — Evaluation (Signed)
Occupational Therapy Evaluation Patient Details Name: FATOUMATTA HARDINGER MRN: [id number] DOB: 08-01-1950 Today's Date: 02/07/2021    History of Present Illness AYSLINN GROSHEK is a 70 y.o. female with a past medical history of essential hypertension, hypothyroidism, alcohol abuse, vitamin B12 deficiency who also has a history of COPD who was in her usual state of health till she felt like she was having difficulty breathing.  Denies any chest pain.  She drinks alcohol quite heavily, going through multiple bottles of vodka on a weekly basis.  She ran out of alcohol yesterday.  Since then she has been having shaking episodes and tremors.  Denies any seizure activity.  Symptoms got worse in the morning when she felt that she was unable to breathe.  After getting medications in the emergency department for her withdrawal symptoms she is feeling better.  She also admitted to nausea and episodes of vomiting overnight.  Denies any abdominal pain.    In the ED she underwent chest x-ray which was unremarkable.  She was noted to be tremulous and tachycardic.  Her symptoms and signs were suggestive of alcohol withdrawal syndrome.  She will need hospitalization for continued management.   Clinical Impression   Patient in bed upon therapy arrival and agreeable to participate in OT evaluation. Patient demonstrates generalized bilateral upper body weakness with decreased activity tolerance and endurance causing increased risk for falls and increased need for assistance to complete ADL tasks. Recommend discharge home with Home Health OT to focus on mentioned deficits.     Follow Up Recommendations  Home health OT    Equipment Recommendations  3 in 1 bedside commode;Tub/shower seat    Recommendations for Other Services       Precautions / Restrictions Precautions Precautions: Fall Restrictions Weight Bearing Restrictions: No      Mobility Bed Mobility Overal bed mobility: Needs Assistance Bed Mobility:  Supine to Sit     Supine to sit: Supervision          Transfers Overall transfer level: Needs assistance Equipment used: Rolling walker (2 wheeled) Transfers: Sit to/from [facility] Sit to Stand: Min guard Stand pivot transfers: Min guard            Balance Overall balance assessment: Needs assistance Sitting-balance support: No upper extremity supported;Feet unsupported Sitting balance-Leahy Scale: Good     Standing balance support: Bilateral upper extremity supported;During functional activity Standing balance-Leahy Scale: Fair                             ADL either performed or assessed with clinical judgement   ADL Overall ADL's : Needs assistance/impaired Eating/Feeding: Set up;Sitting Eating/Feeding Details (indicate cue type and reason): assist to open containers and drinks. Grooming: Wash/dry hands;Wash/dry face;Brushing hair;Set up;Sitting   Upper Body Bathing: Minimal assistance;Sitting   Lower Body Bathing: Minimal assistance;Sitting/lateral leans;Sit to/from stand   Upper Body Dressing : Minimal assistance;Sitting   Lower Body Dressing: Minimal assistance;Sitting/lateral leans;Sit to/from stand   Toilet Transfer: Min guard;BSC;RW                   Vision Baseline Vision/History: No visual deficits Patient Visual Report: No change from baseline                  Pertinent Vitals/Pain Pain Assessment: 0-10 Pain Score: 9  Pain Location: back Pain Descriptors / Indicators: Sore;Aching Pain Intervention(s): RN gave pain meds during session  Hand Dominance Right   Extremity/Trunk Assessment Upper Extremity Assessment Upper Extremity Assessment: Generalized weakness   Lower Extremity Assessment Lower Extremity Assessment: Defer to PT evaluation       Communication Communication Communication: No difficulties   Cognition Arousal/Alertness: Awake/alert Behavior During Therapy: Flat affect Overall  Cognitive Status: Within Functional Limits for tasks assessed                                                      Home Living Family/patient expects to be discharged to:: Private residence Living Arrangements: Alone Available Help at Discharge: Family;Available PRN/intermittently (Daughter lives next door although she hasn't been providing much help recently per patient.) Type of Home: House Home Access: Stairs to enter Technical brewer of Steps: 1 Entrance Stairs-Rails: None Home Layout: One level     Bathroom Shower/Tub: Occupational psychologist: Standard     Home Equipment: Environmental consultant - 2 wheels          Prior Functioning/Environment Level of Independence: Independent with assistive device(s)                 OT Problem List: Decreased strength;Pain;Decreased coordination;Decreased activity tolerance;Impaired balance (sitting and/or standing)      OT Treatment/Interventions: Self-care/ADL training;Therapeutic exercise;Therapeutic activities;Energy conservation;DME and/or AE instruction;Manual therapy;Modalities;Patient/family education;Balance training    OT Goals(Current goals can be found in the care plan section) Acute Rehab OT Goals Patient Stated Goal: return home OT Goal Formulation: With patient Time For Goal Achievement: 02/21/21 Potential to Achieve Goals: Good  OT Frequency: Min 2X/week    AM-PAC OT "6 Clicks" Daily Activity     Outcome Measure Help from another person eating meals?: A Little Help from another person taking care of personal grooming?: A Little Help from another person toileting, which includes using toliet, bedpan, or urinal?: A Little Help from another person bathing (including washing, rinsing, drying)?: A Little Help from another person to put on and taking off regular upper body clothing?: A Little Help from another person to put on and taking off regular lower body clothing?: A Little 6 Click  Score: 18   End of Session Equipment Utilized During Treatment: Gait belt;Rolling walker Nurse Communication: Mobility status  Activity Tolerance: Patient tolerated treatment well Patient left: in chair  OT Visit Diagnosis: Muscle weakness (generalized) (M62.81)                Time: [id number] OT Time Calculation (min): 20 min Charges:  OT General Charges $OT Visit: 1 Visit OT Evaluation $OT Eval Low Complexity: 1 Low  Ailene Ravel, OTR/L,CBIS  [phone]   Carliss Porcaro, Clarene Duke 02/07/2021, 9:36 AM

## 2021-02-07 NOTE — Plan of Care (Signed)
  Problem: Acute Rehab OT Goals (only OT should resolve) Goal: Pt. Will Perform Eating Flowsheets (Taken 02/07/2021 0938) Pt Will Perform Eating:  with modified independence  sitting Goal: Pt. Will Perform Grooming Flowsheets (Taken 02/07/2021 0938) Pt Will Perform Grooming:  standing  with supervision Note: At sink Goal: Pt. Will Perform Upper Body Bathing Flowsheets (Taken 02/07/2021 0938) Pt Will Perform Upper Body Bathing:  with set-up  sitting Goal: Pt. Will Perform Lower Body Bathing Flowsheets (Taken 02/07/2021 0938) Pt Will Perform Lower Body Bathing:  with set-up  sitting/lateral leans  sit to/from stand Goal: Pt. Will Perform Upper Body Dressing Flowsheets (Taken 02/07/2021 0938) Pt Will Perform Upper Body Dressing:  with set-up  sitting Goal: Pt. Will Perform Lower Body Dressing Flowsheets (Taken 02/07/2021 0938) Pt Will Perform Lower Body Dressing:  with set-up  sit to/from stand  sitting/lateral leans Goal: Pt. Will Transfer To Toilet Flowsheets (Taken 02/07/2021 [phone]) Pt Will Transfer to Toilet:  with supervision  bedside commode  ambulating Goal: Pt. Will Perform Toileting-Clothing Manipulation Flowsheets (Taken 02/07/2021 [phone]) Pt Will Perform Toileting - Clothing Manipulation and hygiene:  with supervision  sitting/lateral leans  sit to/from stand Goal: Pt/Caregiver Will Perform Home Exercise Program Flowsheets (Taken 02/07/2021 [phone]) Pt/caregiver will Perform Home Exercise Program:  Increased strength  Both right and left upper extremity  With Supervision  With written HEP provided

## 2021-02-08 ENCOUNTER — Ambulatory Visit (INDEPENDENT_AMBULATORY_CARE_PROVIDER_SITE_OTHER): Payer: Medicare Other | Admitting: Licensed Clinical Social Worker

## 2021-02-08 DIAGNOSIS — J441 Chronic obstructive pulmonary disease with (acute) exacerbation: Secondary | ICD-10-CM | POA: Diagnosis not present

## 2021-02-08 DIAGNOSIS — E785 Hyperlipidemia, unspecified: Secondary | ICD-10-CM

## 2021-02-08 DIAGNOSIS — F419 Anxiety disorder, unspecified: Secondary | ICD-10-CM

## 2021-02-08 DIAGNOSIS — F321 Major depressive disorder, single episode, moderate: Secondary | ICD-10-CM | POA: Diagnosis not present

## 2021-02-08 DIAGNOSIS — I10 Essential (primary) hypertension: Secondary | ICD-10-CM | POA: Diagnosis not present

## 2021-02-08 DIAGNOSIS — K219 Gastro-esophageal reflux disease without esophagitis: Secondary | ICD-10-CM

## 2021-02-08 DIAGNOSIS — G4700 Insomnia, unspecified: Secondary | ICD-10-CM

## 2021-02-08 DIAGNOSIS — E039 Hypothyroidism, unspecified: Secondary | ICD-10-CM

## 2021-02-08 NOTE — Patient Instructions (Signed)
Visit Information  PATIENT GOALS:  Goals Addressed             This Visit's Progress    Manage Anxiety and Stress issues faced       Timeframe:  Short-Term Goal Priority:  High Progress: On Track Start Date:   02/08/21                   Expected End Date:  05/11/21                Follow Up Date 03/04/21   Manage My Emotions (Patient) Manage Anxiety and Stress Symptoms Faced    Why is this important?   When you are stressed, down or upset, your body reacts too.  For example, your blood pressure may get higher; you may have a headache or stomachache.  When your emotions get the best of you, your body's ability to fight off cold and flu gets weak.  These steps will help you manage your emotions.     Patient Self Care Activities:  Takes medications as prescribed  Patient Coping Strengths:  Has some family support from her son, Stacy Dalton  Patient Self Care Deficits:  Hx Alcohol Use/Abuse  Patient Goals:  - spend time or talk with others every day - practice relaxation or meditation daily - keep a calendar with appointment dates  Follow Up Plan: LCSW to call client or son, Stacy Dalton, on 03/04/21     Norva Riffle.Calyx Hawker MSW, LCSW Licensed Clinical Social Worker [facility] Care Management [phone]

## 2021-02-08 NOTE — Chronic Care Management (AMB) (Signed)
Chronic Care Management    Clinical Social Work Note  02/08/2021 Name: Stacy Dalton MRN: [id number] DOB: 05/16/1951  Stacy Dalton is a 70 y.o. year old female who is a primary care patient of [name], FNP. The CCM team was consulted to assist the patient with chronic disease management and/or care coordination needs related to: [name] .   Engaged with patient / son of patient, Stacy Dalton, by telephone for follow up visit in response to provider referral for social work chronic care management and care coordination services.   Consent to Services:  The patient was given information about Chronic Care Management services, agreed to services, and gave verbal consent prior to initiation of services.  Please see initial visit note for detailed documentation.   Patient agreed to services and consent obtained.   Assessment: Review of patient past medical history, allergies, medications, and health status, including review of relevant consultants reports was performed today as part of a comprehensive evaluation and provision of chronic care management and care coordination services.     SDOH (Social Determinants of Health) assessments and interventions performed: Client has alcohol dependence issue. Was recently hospitalized at [facility] for shortness of breath.  Has family support; but, she often will not go through a whole detox program. She sometimes starts detox program and discharges before it is completed.  SDOH Interventions    Flowsheet Row Most Recent Value  SDOH Interventions   Depression Interventions/Treatment  Medication        Advanced Directives Status: See Vynca application for related entries.  CCM Care Plan  Allergies  Allergen Reactions   Librium [Chlordiazepoxide]     Became unresponsive   Toradol [Ketorolac Tromethamine] Shortness Of Breath   Butalbital-Apap-Caffeine Other (See Comments)    jittery   Demerol Swelling   Esgic  [Butalbital-Apap-Caffeine] Other (See Comments)    jittery   Iodine Other (See Comments)    bp bottomed out per pt several hours later; unsure if pre medicated in past with cm; done in [place]    Outpatient Encounter Medications as of 02/08/2021  Medication Sig   amLODipine-olmesartan (AZOR) 10-40 MG tablet Take 1 tablet by mouth daily.   Aspirin-Caffeine (BC FAST PAIN RELIEF) 845-65 MG PACK Take 1 packet by mouth daily as needed (pain or headache).   chlordiazePOXIDE (LIBRIUM) 10 MG capsule Take 3 tablets by mouth daily X2 days; then 2 tablets by mouth daily X 2 days, then 1 tablet by mouth daily X 3 days and stop librium.   cyanocobalamin (,VITAMIN B-12,) 1000 MCG/ML injection Inject 1000 mcg IM daily X 5 days; then weekly for a month and then monthly.   DULoxetine (CYMBALTA) 60 MG capsule Take 1 capsule (60 mg total) by mouth daily.   fluticasone furoate-vilanterol (BREO ELLIPTA) 200-25 MCG/INH AEPB Inhale 1 puff into the lungs daily.   folic acid (FOLVITE) 1 MG tablet Take 1 tablet (1 mg total) by mouth daily.   levothyroxine (SYNTHROID) 50 MCG tablet Take 1 tablet (50 mcg total) by mouth daily before breakfast.   magnesium oxide (MAG-OX) 400 (241.3 Mg) MG tablet Take 1 tablet (400 mg total) by mouth 2 (two) times daily.   Multiple Vitamin (MULTIVITAMIN WITH MINERALS) TABS tablet Take 1 tablet by mouth daily.   pantoprazole (PROTONIX) 40 MG tablet Take 1 tablet (40 mg total) by mouth daily.   thiamine 100 MG tablet Take 1 tablet (100 mg total) by mouth daily.   Vitamin D, Ergocalciferol, (  DRISDOL) 1.25 MG (50000 UNIT) CAPS capsule Take 1 capsule (50,000 Units total) by mouth every 7 (seven) days.   No facility-administered encounter medications on file as of 02/08/2021.    Patient Active Problem List   Diagnosis Date Noted   Physical deconditioning    Vitamin B12 deficiency 02/04/2021   Leukocytosis 11/23/2020   Alcohol use disorder, severe, dependence ([place]) 10/08/2020   Hyponatremia  09/17/2020   Alcohol withdrawal ([place]) 09/12/2020   Chronic bronchitis ([place]) 08/20/2020   Moderate protein malnutrition (HCC) 08/20/2020   Chronic low back pain 08/19/2020   Elevated SGOT ([facility]) 08/19/2020   Elevated troponin 08/19/2020   Increased serum lipase level 08/19/2020   Alcohol abuse 06/21/2020   Multifocal pneumonia 09/19/2018   Large bowel obstruction (HCC)    Small bowel obstruction ([place]) 07/20/2018   Hypomagnesemia 07/20/2018   Cough 03/14/2018   Fatigue 03/14/2018   Polypharmacy 02/23/2017   Hypokalemia [id number]   Toxic metabolic encephalopathy [id number]   Anxiety 02/22/2017   GERD (gastroesophageal reflux disease) 02/22/2017   Pain medication agreement signed 10/10/2016   Opioid dependence ([place]) 10/10/2016   Status post colostomy takedown 04/12/2016   Chronic constipation 12/03/2015   Peritonitis with abscess of intestine ([place]) 11/29/2015   COPD exacerbation ([place]) 09/20/2015   Osteopenia 06/10/2014   Vitamin D deficiency 06/10/2014   Hypothyroidism    Depression 11/18/2012   Insomnia 11/18/2012   Chronic pain syndrome 11/18/2012   HLD (hyperlipidemia) 09/01/2012   S/P colostomy ([place]) 04/19/2012   Normocytic anemia 10/15/2011   Hypertension 10/13/2011   Chronic back pain 10/13/2011   COPD (chronic obstructive pulmonary disease) ([place]) 10/09/2011    Conditions to be addressed/monitored: monitor client management of anxiety and stress issues; monitor client alcohol usage   Care Plan : LCSW Care Plan  Updates made by Katha Cabal, LCSW since 02/08/2021 12:00 AM     Problem: Emotional Distress      Goal: Emotional Health Supported: Manage depression; Manage Alcohol Usage   Start Date: 02/08/2021  Expected End Date: 05/11/2021  This Visit's Progress: On track  Recent Progress: Not on track  Priority: High  Note:   Current Barriers:  Chronic Mental Health needs related to management of anxiety and stress issues Suicidal Ideation/Homicidal Ideation:  No Hx of Alcohol Use/Abuse  Clinical Social Work Goal(s):  patient will work with SW monthly by telephone or in person to reduce or manage symptoms related to anxiety and stress issues faced Client will call LCSW in next 30 days as needed to discuss LCSW support with CCM program  Interventions: 1:1 collaboration with  [name], FNP regarding development and update of comprehensive plan of care as evidenced by provider attestation and co-signature LCSW spoke via phone today with Stacy Dalton, son of client, to discuss client needs LCSW and Stacy Dalton spoke of recent client hospitalization LCSW and Stacy Dalton spoke of family support for client (her son , Stacy Dalton, is supportive; her son, Stacy Dalton, is supportive) Talked with Stacy Dalton about decreased appetite of client Talked with Stacy Dalton about fact that client had obtained a DWI ticket and that she in fact did miss her court date to appear in court related to DWI ticket Talked with Stacy Dalton about client frequency of drinking. He said that usually when she starts drinking she drinks continually. He said he has seen her drink sometimes for a week at a time.  Talked with Stacy Dalton about CCM program support. Talked with Stacy Dalton about LCSW support for client. Talked with Stacy Dalton about LPN  support at [facility] for client Talked with Stacy Dalton about his employment. He said he lived in [place], [place] and is a Administrator.  He said he travels for his job and said that his brother, Stacy Dalton, lives closer to client home.  He said that Stacy Dalton, his brother, can more easily check on client needs since he lives in closer proximity to client home [place] for phone call today with LCSW and encouraged him to call LCSW or LPN as needed for client support  Patient Self Care Activities:  Takes medications as prescribed  Patient Coping Strengths:  Has family support from her son, Stacy Dalton  Patient Self Care Deficits:  Hx Alcohol Use/Abuse  Patient Goals:  - spend time or talk with others every day -  practice relaxation or meditation daily - keep a calendar with appointment dates  Follow Up Plan: LCSW to call client or son, Stacy Dalton, on 03/04/21     Norva Riffle.Graceanna Theissen MSW, LCSW Licensed Clinical Social Worker [facility] Care Management [phone]

## 2021-02-09 DIAGNOSIS — Z049 Encounter for examination and observation for unspecified reason: Secondary | ICD-10-CM | POA: Diagnosis not present

## 2021-02-09 DIAGNOSIS — R4182 Altered mental status, unspecified: Secondary | ICD-10-CM | POA: Diagnosis not present

## 2021-02-09 DIAGNOSIS — D696 Thrombocytopenia, unspecified: Secondary | ICD-10-CM | POA: Diagnosis not present

## 2021-02-09 DIAGNOSIS — R55 Syncope and collapse: Secondary | ICD-10-CM | POA: Diagnosis not present

## 2021-02-09 DIAGNOSIS — R569 Unspecified convulsions: Secondary | ICD-10-CM | POA: Diagnosis not present

## 2021-02-11 ENCOUNTER — Telehealth: Payer: Self-pay | Admitting: Family

## 2021-02-11 DIAGNOSIS — Z20822 Contact with and (suspected) exposure to covid-19: Secondary | ICD-10-CM | POA: Diagnosis not present

## 2021-02-11 NOTE — Telephone Encounter (Signed)
Stephanie [employer] in [place]  Needs to talk to pcp about cutting pt off of all pain medication.  Please call back [phone] ext [phone]

## 2021-02-11 NOTE — Telephone Encounter (Signed)
Adult protective services aware

## 2021-02-11 NOTE — Telephone Encounter (Signed)
I have not given patient controlled medications in months because of alcohol abuse and control contract broken.  Evelina Dun, FNP

## 2021-02-12 DIAGNOSIS — R509 Fever, unspecified: Secondary | ICD-10-CM | POA: Diagnosis not present

## 2021-02-12 DIAGNOSIS — R4182 Altered mental status, unspecified: Secondary | ICD-10-CM | POA: Diagnosis not present

## 2021-02-14 DIAGNOSIS — R0602 Shortness of breath: Secondary | ICD-10-CM | POA: Diagnosis not present

## 2021-02-15 DIAGNOSIS — F10139 Alcohol abuse with withdrawal, unspecified: Secondary | ICD-10-CM | POA: Diagnosis not present

## 2021-02-15 DIAGNOSIS — I10 Essential (primary) hypertension: Secondary | ICD-10-CM | POA: Diagnosis not present

## 2021-02-15 DIAGNOSIS — G9341 Metabolic encephalopathy: Secondary | ICD-10-CM | POA: Diagnosis not present

## 2021-02-15 DIAGNOSIS — E039 Hypothyroidism, unspecified: Secondary | ICD-10-CM | POA: Diagnosis not present

## 2021-02-15 DIAGNOSIS — J690 Pneumonitis due to inhalation of food and vomit: Secondary | ICD-10-CM | POA: Diagnosis not present

## 2021-02-15 DIAGNOSIS — R569 Unspecified convulsions: Secondary | ICD-10-CM | POA: Diagnosis not present

## 2021-02-15 DIAGNOSIS — K219 Gastro-esophageal reflux disease without esophagitis: Secondary | ICD-10-CM | POA: Diagnosis not present

## 2021-02-15 DIAGNOSIS — J9601 Acute respiratory failure with hypoxia: Secondary | ICD-10-CM | POA: Diagnosis not present

## 2021-02-16 DIAGNOSIS — I10 Essential (primary) hypertension: Secondary | ICD-10-CM | POA: Diagnosis not present

## 2021-02-16 DIAGNOSIS — K219 Gastro-esophageal reflux disease without esophagitis: Secondary | ICD-10-CM | POA: Diagnosis not present

## 2021-02-16 DIAGNOSIS — F10139 Alcohol abuse with withdrawal, unspecified: Secondary | ICD-10-CM | POA: Diagnosis not present

## 2021-02-16 DIAGNOSIS — E039 Hypothyroidism, unspecified: Secondary | ICD-10-CM | POA: Diagnosis not present

## 2021-02-16 DIAGNOSIS — R569 Unspecified convulsions: Secondary | ICD-10-CM | POA: Diagnosis not present

## 2021-02-16 DIAGNOSIS — J690 Pneumonitis due to inhalation of food and vomit: Secondary | ICD-10-CM | POA: Diagnosis not present

## 2021-02-16 DIAGNOSIS — J9601 Acute respiratory failure with hypoxia: Secondary | ICD-10-CM | POA: Diagnosis not present

## 2021-02-16 DIAGNOSIS — G9341 Metabolic encephalopathy: Secondary | ICD-10-CM | POA: Diagnosis not present

## 2021-02-17 DIAGNOSIS — K219 Gastro-esophageal reflux disease without esophagitis: Secondary | ICD-10-CM | POA: Diagnosis not present

## 2021-02-17 DIAGNOSIS — J9601 Acute respiratory failure with hypoxia: Secondary | ICD-10-CM | POA: Diagnosis not present

## 2021-02-17 DIAGNOSIS — E039 Hypothyroidism, unspecified: Secondary | ICD-10-CM | POA: Diagnosis not present

## 2021-02-17 DIAGNOSIS — F10132 Alcohol abuse with withdrawal with perceptual disturbance: Secondary | ICD-10-CM | POA: Diagnosis not present

## 2021-02-17 DIAGNOSIS — R569 Unspecified convulsions: Secondary | ICD-10-CM | POA: Diagnosis not present

## 2021-02-17 DIAGNOSIS — J690 Pneumonitis due to inhalation of food and vomit: Secondary | ICD-10-CM | POA: Diagnosis not present

## 2021-02-17 DIAGNOSIS — G9341 Metabolic encephalopathy: Secondary | ICD-10-CM | POA: Diagnosis not present

## 2021-02-18 DIAGNOSIS — K219 Gastro-esophageal reflux disease without esophagitis: Secondary | ICD-10-CM | POA: Diagnosis not present

## 2021-02-18 DIAGNOSIS — J9601 Acute respiratory failure with hypoxia: Secondary | ICD-10-CM | POA: Diagnosis not present

## 2021-02-18 DIAGNOSIS — G9341 Metabolic encephalopathy: Secondary | ICD-10-CM | POA: Diagnosis not present

## 2021-02-18 DIAGNOSIS — R569 Unspecified convulsions: Secondary | ICD-10-CM | POA: Diagnosis not present

## 2021-02-18 DIAGNOSIS — R509 Fever, unspecified: Secondary | ICD-10-CM | POA: Diagnosis not present

## 2021-02-18 DIAGNOSIS — F10132 Alcohol abuse with withdrawal with perceptual disturbance: Secondary | ICD-10-CM | POA: Diagnosis not present

## 2021-02-18 DIAGNOSIS — J690 Pneumonitis due to inhalation of food and vomit: Secondary | ICD-10-CM | POA: Diagnosis not present

## 2021-02-18 DIAGNOSIS — E039 Hypothyroidism, unspecified: Secondary | ICD-10-CM | POA: Diagnosis not present

## 2021-02-18 DIAGNOSIS — R4182 Altered mental status, unspecified: Secondary | ICD-10-CM | POA: Diagnosis not present

## 2021-02-19 DIAGNOSIS — J690 Pneumonitis due to inhalation of food and vomit: Secondary | ICD-10-CM | POA: Diagnosis not present

## 2021-02-19 DIAGNOSIS — E039 Hypothyroidism, unspecified: Secondary | ICD-10-CM | POA: Diagnosis not present

## 2021-02-19 DIAGNOSIS — K219 Gastro-esophageal reflux disease without esophagitis: Secondary | ICD-10-CM | POA: Diagnosis not present

## 2021-02-19 DIAGNOSIS — G9341 Metabolic encephalopathy: Secondary | ICD-10-CM | POA: Diagnosis not present

## 2021-02-19 DIAGNOSIS — R569 Unspecified convulsions: Secondary | ICD-10-CM | POA: Diagnosis not present

## 2021-02-19 DIAGNOSIS — F10132 Alcohol abuse with withdrawal with perceptual disturbance: Secondary | ICD-10-CM | POA: Diagnosis not present

## 2021-02-19 DIAGNOSIS — J9601 Acute respiratory failure with hypoxia: Secondary | ICD-10-CM | POA: Diagnosis not present

## 2021-02-21 ENCOUNTER — Telehealth: Payer: Self-pay | Admitting: Family

## 2021-02-21 NOTE — Telephone Encounter (Signed)
Spoke with patient, appointment scheduled with Evelina Dun on 02/23/21 at 3:55 pm.

## 2021-02-23 ENCOUNTER — Encounter: Payer: Self-pay | Admitting: Family

## 2021-02-23 ENCOUNTER — Other Ambulatory Visit: Payer: Self-pay

## 2021-02-23 ENCOUNTER — Ambulatory Visit (INDEPENDENT_AMBULATORY_CARE_PROVIDER_SITE_OTHER): Payer: Medicare Other | Admitting: Family

## 2021-02-23 VITALS — BP 142/79 | HR 120 | Temp 98.1°F | Ht 61.0 in | Wt 116.6 lb

## 2021-02-23 DIAGNOSIS — J449 Chronic obstructive pulmonary disease, unspecified: Secondary | ICD-10-CM | POA: Diagnosis not present

## 2021-02-23 DIAGNOSIS — F10939 Alcohol use, unspecified with withdrawal, unspecified: Secondary | ICD-10-CM

## 2021-02-23 DIAGNOSIS — F1010 Alcohol abuse, uncomplicated: Secondary | ICD-10-CM

## 2021-02-23 DIAGNOSIS — G8929 Other chronic pain: Secondary | ICD-10-CM

## 2021-02-23 DIAGNOSIS — F10239 Alcohol dependence with withdrawal, unspecified: Secondary | ICD-10-CM

## 2021-02-23 DIAGNOSIS — M545 Low back pain, unspecified: Secondary | ICD-10-CM

## 2021-02-23 DIAGNOSIS — R399 Unspecified symptoms and signs involving the genitourinary system: Secondary | ICD-10-CM

## 2021-02-23 DIAGNOSIS — J441 Chronic obstructive pulmonary disease with (acute) exacerbation: Secondary | ICD-10-CM

## 2021-02-23 DIAGNOSIS — Z09 Encounter for follow-up examination after completed treatment for conditions other than malignant neoplasm: Secondary | ICD-10-CM

## 2021-02-23 DIAGNOSIS — I10 Essential (primary) hypertension: Secondary | ICD-10-CM

## 2021-02-23 LAB — MICROSCOPIC EXAMINATION
RBC, Urine: >30 /hpf — AB (ref 0–2)
Renal Epithel, UA: NONE SEEN /hpf
WBC, UA: >30 /hpf — AB (ref 0–5)

## 2021-02-23 LAB — URINALYSIS, COMPLETE
Bilirubin, UA: NEGATIVE
Glucose, UA: NEGATIVE
Ketones, UA: NEGATIVE
Nitrite, UA: NEGATIVE
Specific Gravity, UA: 1.025 (ref 1.005–1.030)
Urobilinogen, Ur: 0.2 mg/dL (ref 0.2–1.0)
pH, UA: 5.5 (ref 5.0–7.5)

## 2021-02-23 MED ORDER — PREDNISONE 10 MG (21) PO TBPK: ORAL_TABLET | ORAL | 0 refills | Status: DC

## 2021-02-23 NOTE — Progress Notes (Signed)
Subjective:    Patient ID: Stacy Dalton, female    DOB: [birth year], 70 y.o.   MRN: [id number]  Chief Complaint  Patient presents with   Hospitalization Follow-up   PT presents to the office today for hospital follow up. Pt went to the ED on 02/04/21 for alcohol withdrawal. She was discharged on 02/07/21. Per hospital notes patient was drinking several vodka bottles a week. She states she is not currently drinking and states it has been "several weeks".   Cough This is a recurrent problem. The current episode started 1 to 4 weeks ago. The problem has been waxing and waning. The problem occurs every few minutes. The cough is Productive of sputum. Associated symptoms include headaches, myalgias, nasal congestion and shortness of breath. Pertinent negatives include no ear congestion, ear pain or fever. She has tried rest and OTC cough suppressant for the symptoms.  Hypertension This is a chronic problem. The current episode started more than 1 year ago. The problem has been waxing and waning since onset. The problem is uncontrolled. Associated symptoms include headaches and shortness of breath. Pertinent negatives include no malaise/fatigue or peripheral edema. The current treatment provides moderate improvement.  Dysuria  This is a new problem. The current episode started 1 to 4 weeks ago. The problem occurs intermittently. The quality of the pain is described as burning. The pain is at a severity of 6/10. The pain is mild. Associated symptoms include frequency, hesitancy and urgency. Pertinent negatives include no hematuria or nausea.  Back Pain This is a chronic problem. The current episode started more than 1 year ago. The problem occurs intermittently. The problem has been waxing and waning since onset. The pain is present in the lumbar spine. The quality of the pain is described as aching. The pain is at a severity of 9/10. Associated symptoms include dysuria and headaches. Pertinent  negatives include no fever.     Review of Systems  Constitutional:  Negative for fever and malaise/fatigue.  HENT:  Negative for ear pain.   Respiratory:  Positive for cough and shortness of breath.   Gastrointestinal:  Negative for nausea.  Genitourinary:  Positive for dysuria, frequency, hesitancy and urgency. Negative for hematuria.  Musculoskeletal:  Positive for back pain and myalgias.  Neurological:  Positive for headaches.  All other systems reviewed and are negative.     Objective:   Physical Exam Vitals reviewed.  Constitutional:      General: She is not in acute distress.    Appearance: She is well-developed.  HENT:     Head: Normocephalic and atraumatic.     Right Ear: Tympanic membrane normal.     Left Ear: Tympanic membrane normal.  Eyes:     Pupils: Pupils are equal, round, and reactive to light.  Neck:     Thyroid: No thyromegaly.  Cardiovascular:     Rate and Rhythm: Normal rate and regular rhythm.     Heart sounds: Normal heart sounds. No murmur heard. Pulmonary:     Effort: Pulmonary effort is normal. No respiratory distress.     Breath sounds: Rhonchi present. No wheezing.  Abdominal:     General: Bowel sounds are normal. There is no distension.     Palpations: Abdomen is soft.     Tenderness: There is no abdominal tenderness.  Musculoskeletal:        General: No tenderness. Normal range of motion.     Cervical back: Normal range of motion and neck supple.  Skin:  General: Skin is warm and dry.  Neurological:     Mental Status: She is alert and oriented to person, place, and time.     Cranial Nerves: No cranial nerve deficit.     Deep Tendon Reflexes: Reflexes are normal and symmetric.  Psychiatric:        Behavior: Behavior normal.        Thought Content: Thought content normal.        Judgment: Judgment normal.      BP (!) 142/79   Pulse (!) 120   Temp 98.1 F (36.7 C)   Ht 5' 1"  (1.549 m)   Wt 116 lb 9.6 oz (52.9 kg)   SpO2 97%    BMI 22.03 kg/m      Assessment & Plan:  OCEANE FOSSE comes in today with chief complaint of Hospitalization Follow-up   Diagnosis and orders addressed:  1. Hospital discharge follow-up  - CMP14+EGFR - Magnesium - CBC with Differential/Platelet  2. Alcohol withdrawal syndrome with complication ([place]) Continue with alcohol cessation  - CMP14+EGFR - Magnesium - CBC with Differential/Platelet - Ambulatory referral to Psychiatry  3. Alcohol abuse - CMP14+EGFR - Magnesium - CBC with Differential/Platelet - Ambulatory referral to Psychiatry  4. Chronic obstructive pulmonary disease, unspecified COPD type ([place]) Prednisone  Call if symptoms worsen, may need antibiotic - CMP14+EGFR - Magnesium - CBC with Differential/Platelet  5. Primary hypertension - CMP14+EGFR - Magnesium - CBC with Differential/Platelet  6. UTI symptoms - CMP14+EGFR - Magnesium - Urinalysis, Complete - Urine Culture - CBC with Differential/Platelet  7. COPD exacerbation (HCC)  - predniSONE (STERAPRED UNI-PAK 21 TAB) 10 MG (21) TBPK tablet; Use as directed  Dispense: 21 tablet; Refill: 0  8. Chronic low back pain, unspecified back pain laterality, unspecified whether sciatica present - Ambulatory referral to [place] pending Health Maintenance reviewed Diet and exercise encouraged  Follow up plan: 1 month    Evelina Dun, FNP

## 2021-02-23 NOTE — Patient Instructions (Signed)
Alcohol Abuse and Nutrition Alcohol abuse is any pattern of alcohol consumption that harms your health, relationships, or work. Alcohol abuse can cause poor nutrition (malnutrition or malnourishment) and a lack of nutrients (nutrient deficiencies), which can lead to more health problems. Alcohol abuse brings malnutrition and nutrient deficiencies in two ways: It causes your liver to work abnormally. This affects how your body divides (breaks down) and absorbs nutrients from food. It causes you to eat poorly. Many people who abuse alcohol do not eat enough carbohydrates, protein, fat, vitamins, and minerals. Nutrients that are commonly lacking (deficient) in people who abuse alcohol include: Vitamins. Vitamin A. This is needed for your vision, metabolism, and ability to fight off infections (immunity). B vitamins. These include folate, thiamine, and niacin. These are needed for new cell growth. Vitamin C. This plays an important role in wound healing, immunity, and helping your body to absorb iron. Vitamin D. This is necessary for your body to absorb and use calcium. It is produced by your liver, but you can also get it from food and from sun exposure. Minerals. Calcium. This is needed for healthy bones as well as heart and blood vessel (cardiovascular) function. Iron. This is important for blood, muscle, and nervous system functioning. Magnesium. This plays an important role in muscle and nerve function, and it helps to control blood sugar and blood pressure. Zinc. This is important for the normal functioning of your nervous system and digestive system (gastrointestinal tract). If you think that you have an alcohol dependency problem, or if it is hard to stop drinking because you feel sick or different when you do not use alcohol, talk with your health care provider or another health professional about whereto get help. Nutrition is an essential factor in the therapy for alcohol abuse. Your health  care provider or diet and nutrition specialist (dietitian) will work with you to design a plan that can help to restore nutrients toyour body and prevent the risk of complications. What is my plan? Your dietitian may develop a specific eating plan that is based on your condition and any other problems that you have. An eating plan will commonly include: A balanced diet. Grains: 6-8 oz (170-227 g) a day. Examples of 1 oz of whole grains include 1 cup of whole-wheat cereal,  cup of brown rice, or 1 slice of whole-wheat bread. Vegetables: 2-3 cups a day. Examples of 1 cup of vegetables include 2 medium carrots, 1 large tomato, or 2 stalks of celery. Fruits: 1-2 cups a day. Examples of 1 cup of fruit include 1 large banana, 1 small apple, 8 large strawberries, or 1 large orange. Meat and other protein: 5-6 oz (142-170 g) a day. A cut of meat or fish that is the size of a deck of cards is about 3-4 oz. Foods that provide 1 oz of protein include 1 egg,  cup of nuts or seeds, or 1 tablespoon (16 g) of peanut butter. Dairy: 2-3 cups a day. Examples of 1 cup of dairy include 8 oz (230 mL) of milk, 8 oz (230 g) of yogurt, or 1 oz (44 g) of natural cheese. Vitamin and mineral supplements. What are tips for following this plan? Eat frequent meals and snacks. Try to eat 5-6 small meals each day. Take vitamin or mineral supplements as recommended by your dietitian. If you are malnourished or if your dietitian recommends it: You may follow a high-protein, high-calorie diet. This may include: 2,000-3,000 calories (kilocalories) a day. 70-100 g (grams)  of protein a day. You may be directed to follow a diet that includes a complete nutritional supplement beverage. This can help to restore calories, protein, and vitamins to your body. Depending on your condition, you may be advised to consume this beverage instead of your meals or in addition to them. Certain medicines may cause changes in your appetite, taste,  and weight. Work with your health care provider and dietitian to make any changes to your medicines and eating plan. If you are unable to take in enough food and calories by mouth, your health care provider may recommend a feeding tube. This tube delivers nutritional supplements directly to your stomach. Recommended foods Eat foods that are high in molecules that prevent oxygen from reacting with your food (antioxidants). These foods include grapes, berries, nuts, green tea, and dark green or orange vegetables. Eating these can help to prevent some of the stress that is placed on your liver by consuming alcohol. Eat a variety of fresh fruits and vegetables each day. This will help you to get fiber and vitamins in your diet. Drink plenty of water and other clear fluids, such as apple juice and broth. Try to drink at least 48-64 oz (1.5-2 L) of water a day. Include foods fortified with vitamins and minerals in your diet. Commonly fortified foods include milk, orange juice, cereal, and bread. Eat a variety of foods that are high in omega-3 and omega-6 fatty acids. These include fish, nuts and seeds, and soybeans. These foods may help your liver to recover and may also stabilize your mood. If you are a vegetarian: Eat a variety of protein-rich foods. Pair whole grains with plant-based proteins at meals and snack time. For example, eat rice with beans, put peanut butter on whole-grain toast, or eat oatmeal with sunflower seeds. The items listed above may not be a complete list of foods and beverages youcan eat. Contact a dietitian for more information. Foods to avoid Avoid foods and drinks that are high in fat and sugar. Sugary drinks, salty snacks, and candy contain empty calories. This means that they lack important nutrients such as protein, fiber, and vitamins. Avoid alcohol. This is the best way to avoid malnutrition due to alcohol abuse. If you must drink, drink measured amounts. Measured drinking  means limiting your intake to no more than 1 drink a day for nonpregnant women and 2 drinks a day for men. One drink equals 12 oz (355 mL) of beer, 5 oz (148 mL) of wine, or 1 oz (44 mL) of hard liquor. Limit your intake of caffeine. Replace drinks like coffee and black tea with decaffeinated coffee and decaffeinated herbal tea. The items listed above may not be a complete list of foods and beverages youshould avoid. Contact a dietitian for more information. Summary Alcohol abuse can cause poor nutrition (malnutrition or malnourishment) and a lack of nutrients (nutrient deficiencies), which can lead to more health problems. Common nutrient deficiencies include vitamin deficiencies (A, B, C, and D) and mineral deficiencies (calcium, iron, magnesium, and zinc). Nutrition is an essential factor in the therapy for alcohol abuse. Your health care provider and dietitian can help you to develop a specific eating plan that includes a balanced diet plus vitamin and mineral supplements. This information is not intended to replace advice given to you by your health care provider. Make sure you discuss any questions you have with your healthcare provider. Document Revised: 05/10/2020 Document Reviewed: 05/10/2020 Elsevier Patient Education  2022 [employer].

## 2021-02-24 ENCOUNTER — Other Ambulatory Visit: Payer: Self-pay | Admitting: Family

## 2021-02-24 LAB — CBC WITH DIFFERENTIAL/PLATELET
Basophils Absolute: 0.1 10*3/uL (ref 0.0–0.2)
Basos: 1 %
EOS (ABSOLUTE): 0.5 10*3/uL — ABNORMAL HIGH (ref 0.0–0.4)
Eos: 6 %
Hematocrit: 36.4 % (ref 34.0–46.6)
Hemoglobin: 12.1 g/dL (ref 11.1–15.9)
Immature Grans (Abs): 0.4 10*3/uL — ABNORMAL HIGH (ref 0.0–0.1)
Immature Granulocytes: 5 %
Lymphocytes Absolute: 1.3 10*3/uL (ref 0.7–3.1)
Lymphs: 17 %
MCH: 26.6 pg (ref 26.6–33.0)
MCHC: 33.2 g/dL (ref 31.5–35.7)
MCV: 80 fL (ref 79–97)
Monocytes Absolute: 0.9 10*3/uL (ref 0.1–0.9)
Monocytes: 12 %
Neutrophils Absolute: 4.4 10*3/uL (ref 1.4–7.0)
Neutrophils: 59 %
Platelets: 534 10*3/uL — ABNORMAL HIGH (ref 150–450)
RBC: 4.55 x10E6/uL (ref 3.77–5.28)
RDW: 19.4 % — ABNORMAL HIGH (ref 11.7–15.4)
WBC: 7.5 10*3/uL (ref 3.4–10.8)

## 2021-02-24 LAB — CMP14+EGFR
ALT: 32 IU/L (ref 0–32)
AST: 43 IU/L — ABNORMAL HIGH (ref 0–40)
Albumin/Globulin Ratio: 2.5 — ABNORMAL HIGH (ref 1.2–2.2)
Albumin: 4.7 g/dL (ref 3.7–4.7)
Alkaline Phosphatase: 99 IU/L (ref 44–121)
BUN/Creatinine Ratio: 13 (ref 12–28)
BUN: 18 mg/dL (ref 8–27)
Bilirubin Total: 0.2 mg/dL (ref 0.0–1.2)
CO2: 20 mmol/L (ref 20–29)
Calcium: 10.6 mg/dL — ABNORMAL HIGH (ref 8.7–10.3)
Chloride: 98 mmol/L (ref 96–106)
Creatinine, Ser: 1.36 mg/dL — ABNORMAL HIGH (ref 0.57–1.00)
Globulin, Total: 1.9 g/dL (ref 1.5–4.5)
Glucose: 116 mg/dL — ABNORMAL HIGH (ref 65–99)
Potassium: 5.5 mmol/L — ABNORMAL HIGH (ref 3.5–5.2)
Sodium: 137 mmol/L (ref 134–144)
Total Protein: 6.6 g/dL (ref 6.0–8.5)
eGFR: 41 mL/min/{1.73_m2} — ABNORMAL LOW (ref >59)

## 2021-02-24 LAB — MAGNESIUM: Magnesium: 1.7 mg/dL (ref 1.6–2.3)

## 2021-02-24 MED ORDER — AMOXICILLIN-POT CLAVULANATE 875-125 MG PO TABS: 1.0000 | ORAL_TABLET | Freq: Two times a day (BID) | ORAL | 0 refills | Status: DC

## 2021-02-24 MED ORDER — FLUCONAZOLE 150 MG PO TABS: 150.0000 mg | ORAL_TABLET | ORAL | 0 refills | Status: DC | PRN

## 2021-03-01 ENCOUNTER — Ambulatory Visit (INDEPENDENT_AMBULATORY_CARE_PROVIDER_SITE_OTHER): Payer: Medicare Other

## 2021-03-01 VITALS — Ht 61.0 in | Wt 116.0 lb

## 2021-03-01 DIAGNOSIS — Z0000 Encounter for general adult medical examination without abnormal findings: Secondary | ICD-10-CM

## 2021-03-01 NOTE — Patient Instructions (Signed)
Ms. Lutzke , Thank you for taking time to come for your Medicare Wellness Visit. I appreciate your ongoing commitment to your health goals. Please review the following plan we discussed and let me know if I can assist you in the future.   Screening recommendations/referrals: Colonoscopy: No longer required Mammogram: Due every year, please call to schedule Bone Density: 12/05/16, due every 2 yeares Recommended yearly ophthalmology/optometry visit for glaucoma screening and checkup Recommended yearly dental visit for hygiene and checkup  Vaccinations: Influenza vaccine: 06/22/20 Due in fall Pneumococcal vaccine: Prevnar done 08/27/20,  2nd dose due Tdap vaccine: Done 10/30/20 Repeat in 10 years  Shingles vaccine: Declined   Covid-19:Declined  Advanced directives: Advance directive discussed with you today. Even though you declined this today, please call our office should you change your mind, and we can give you the proper paperwork for you to fill out.   Conditions/risks identified: Aim for 30 minutes of exercise or brisk walking each day, drink 6-8 glasses of water and eat lots of fruits and vegetables. Continue alcohol cessation.  Next appointment: Follow up in one year for your annual wellness visit    Preventive Care 65 Years and Older, Female Preventive care refers to lifestyle choices and visits with your health care provider that can promote health and wellness. What does preventive care include? A yearly physical exam. This is also called an annual well check. Dental exams once or twice a year. Routine eye exams. Ask your health care provider how often you should have your eyes checked. Personal lifestyle choices, including: Daily care of your teeth and gums. Regular physical activity. Eating a healthy diet. Avoiding tobacco and drug use. Limiting alcohol use. Practicing safe sex. Taking low-dose aspirin every day. Taking vitamin and mineral supplements as recommended  by your health care provider. What happens during an annual well check? The services and screenings done by your health care provider during your annual well check will depend on your age, overall health, lifestyle risk factors, and family history of disease. Counseling  Your health care provider may ask you questions about your: Alcohol use. Tobacco use. Drug use. Emotional well-being. Home and relationship well-being. Sexual activity. Eating habits. History of falls. Memory and ability to understand (cognition). Work and work Statistician. Reproductive health. Screening  You may have the following tests or measurements: Height, weight, and BMI. Blood pressure. Lipid and cholesterol levels. These may be checked every 5 years, or more frequently if you are over 54 years old. Skin check. Lung cancer screening. You may have this screening every year starting at age 49 if you have a 30-pack-year history of smoking and currently smoke or have quit within the past 15 years. Fecal occult blood test (FOBT) of the stool. You may have this test every year starting at age 9. Flexible sigmoidoscopy or colonoscopy. You may have a sigmoidoscopy every 5 years or a colonoscopy every 10 years starting at age 63. Hepatitis C blood test. Hepatitis B blood test. Sexually transmitted disease (STD) testing. Diabetes screening. This is done by checking your blood sugar (glucose) after you have not eaten for a while (fasting). You may have this done every 1-3 years. Bone density scan. This is done to screen for osteoporosis. You may have this done starting at age 85. Mammogram. This may be done every 1-2 years. Talk to your health care provider about how often you should have regular mammograms. Talk with your health care provider about your test results, treatment options, and if  necessary, the need for more tests. Vaccines  Your health care provider may recommend certain vaccines, such as: Influenza  vaccine. This is recommended every year. Tetanus, diphtheria, and acellular pertussis (Tdap, Td) vaccine. You may need a Td booster every 10 years. Zoster vaccine. You may need this after age 31. Pneumococcal 13-valent conjugate (PCV13) vaccine. One dose is recommended after age 70. Pneumococcal polysaccharide (PPSV23) vaccine. One dose is recommended after age 19. Talk to your health care provider about which screenings and vaccines you need and how often you need them. This information is not intended to replace advice given to you by your health care provider. Make sure you discuss any questions you have with your health care provider. Document Released: 07/16/2015 Document Revised: 03/08/2016 Document Reviewed: 04/20/2015 Elsevier Interactive Patient Education  2017 [employer] Prevention in the Home Falls can cause injuries. They can happen to people of all ages. There are many things you can do to make your home safe and to help prevent falls. What can I do on the outside of my home? Regularly fix the edges of walkways and driveways and fix any cracks. Remove anything that might make you trip as you walk through a door, such as a raised step or threshold. Trim any bushes or trees on the path to your home. Use bright outdoor lighting. Clear any walking paths of anything that might make someone trip, such as rocks or tools. Regularly check to see if handrails are loose or broken. Make sure that both sides of any steps have handrails. Any raised decks and porches should have guardrails on the edges. Have any leaves, snow, or ice cleared regularly. Use sand or salt on walking paths during winter. Clean up any spills in your garage right away. This includes oil or grease spills. What can I do in the bathroom? Use night lights. Install grab bars by the toilet and in the tub and shower. Do not use towel bars as grab bars. Use non-skid mats or decals in the tub or shower. If you  need to sit down in the shower, use a plastic, non-slip stool. Keep the floor dry. Clean up any water that spills on the floor as soon as it happens. Remove soap buildup in the tub or shower regularly. Attach bath mats securely with double-sided non-slip rug tape. Do not have throw rugs and other things on the floor that can make you trip. What can I do in the bedroom? Use night lights. Make sure that you have a light by your bed that is easy to reach. Do not use any sheets or blankets that are too big for your bed. They should not hang down onto the floor. Have a firm chair that has side arms. You can use this for support while you get dressed. Do not have throw rugs and other things on the floor that can make you trip. What can I do in the kitchen? Clean up any spills right away. Avoid walking on wet floors. Keep items that you use a lot in easy-to-reach places. If you need to reach something above you, use a strong step stool that has a grab bar. Keep electrical cords out of the way. Do not use floor polish or wax that makes floors slippery. If you must use wax, use non-skid floor wax. Do not have throw rugs and other things on the floor that can make you trip. What can I do with my stairs? Do not leave any items  on the stairs. Make sure that there are handrails on both sides of the stairs and use them. Fix handrails that are broken or loose. Make sure that handrails are as long as the stairways. Check any carpeting to make sure that it is firmly attached to the stairs. Fix any carpet that is loose or worn. Avoid having throw rugs at the top or bottom of the stairs. If you do have throw rugs, attach them to the floor with carpet tape. Make sure that you have a light switch at the top of the stairs and the bottom of the stairs. If you do not have them, ask someone to add them for you. What else can I do to help prevent falls? Wear shoes that: Do not have high heels. Have rubber  bottoms. Are comfortable and fit you well. Are closed at the toe. Do not wear sandals. If you use a stepladder: Make sure that it is fully opened. Do not climb a closed stepladder. Make sure that both sides of the stepladder are locked into place. Ask someone to hold it for you, if possible. Clearly mark and make sure that you can see: Any grab bars or handrails. First and last steps. Where the edge of each step is. Use tools that help you move around (mobility aids) if they are needed. These include: Canes. Walkers. Scooters. Crutches. Turn on the lights when you go into a dark area. Replace any light bulbs as soon as they burn out. Set up your furniture so you have a clear path. Avoid moving your furniture around. If any of your floors are uneven, fix them. If there are any pets around you, be aware of where they are. Review your medicines with your doctor. Some medicines can make you feel dizzy. This can increase your chance of falling. Ask your doctor what other things that you can do to help prevent falls. This information is not intended to replace advice given to you by your health care provider. Make sure you discuss any questions you have with your health care provider. Document Released: 04/15/2009 Document Revised: 11/25/2015 Document Reviewed: 07/24/2014 Elsevier Interactive Patient Education  2017 [employer].

## 2021-03-01 NOTE — Progress Notes (Signed)
Subjective:   Stacy Dalton is a 70 y.o. female who presents for Medicare Annual (Subsequent) preventive examination.  Review of Systems     Cardiac Risk Factors include: advanced age (>92mn, >>28women);sedentary lifestyle     Objective:    Today's Vitals   03/01/21 1411 03/01/21 1413  Weight: 116 lb (52.6 kg)   Height: '5\' 1"'$  (1.549 m)   PainSc:  5    Body mass index is 21.92 kg/m.  Advanced Directives 03/01/2021 02/04/2021 02/04/2021 01/18/2021 10/30/2020 10/26/2020 10/08/2020  Does Patient Have a Medical Advance Directive? No No No No No No -  Would patient like information on creating a medical advance directive? No - Patient declined No - Patient declined No - Patient declined No - Patient declined No - Patient declined No - Patient declined No - Guardian declined  Pre-existing out of facility DNR order (yellow form or pink MOST form) - - - - - - -    Current Medications (verified) Outpatient Encounter Medications as of 03/01/2021  Medication Sig   amLODipine-olmesartan (AZOR) 10-40 MG tablet Take 1 tablet by mouth daily.   amoxicillin-clavulanate (AUGMENTIN) 875-125 MG tablet Take 1 tablet by mouth 2 (two) times daily.   Aspirin-Caffeine (BC FAST PAIN RELIEF) 845-65 MG PACK Take 1 packet by mouth daily as needed (pain or headache).   chlordiazePOXIDE (LIBRIUM) 10 MG capsule Take 3 tablets by mouth daily X2 days; then 2 tablets by mouth daily X 2 days, then 1 tablet by mouth daily X 3 days and stop librium.   cyanocobalamin (,VITAMIN B-12,) 1000 MCG/ML injection Inject 1000 mcg IM daily X 5 days; then weekly for a month and then monthly.   DULoxetine (CYMBALTA) 60 MG capsule Take 1 capsule (60 mg total) by mouth daily.   fluconazole (DIFLUCAN) 150 MG tablet Take 1 tablet (150 mg total) by mouth every three (3) days as needed.   fluticasone furoate-vilanterol (BREO ELLIPTA) 200-25 MCG/INH AEPB Inhale 1 puff into the lungs daily.   folic acid (FOLVITE) 1 MG tablet Take 1 tablet (1  mg total) by mouth daily.   levothyroxine (SYNTHROID) 50 MCG tablet Take 1 tablet (50 mcg total) by mouth daily before breakfast.   magnesium oxide (MAG-OX) 400 (241.3 Mg) MG tablet Take 1 tablet (400 mg total) by mouth 2 (two) times daily.   Multiple Vitamin (MULTIVITAMIN WITH MINERALS) TABS tablet Take 1 tablet by mouth daily.   pantoprazole (PROTONIX) 40 MG tablet Take 1 tablet (40 mg total) by mouth daily.   predniSONE (STERAPRED UNI-PAK 21 TAB) 10 MG (21) TBPK tablet Use as directed   thiamine 100 MG tablet Take 1 tablet (100 mg total) by mouth daily.   Vitamin D, Ergocalciferol, (DRISDOL) 1.25 MG (50000 UNIT) CAPS capsule Take 1 capsule (50,000 Units total) by mouth every 7 (seven) days.   No facility-administered encounter medications on file as of 03/01/2021.    Allergies (verified) Librium [chlordiazepoxide], Toradol [ketorolac tromethamine], Butalbital-apap-caffeine, Demerol, Esgic [butalbital-apap-caffeine], and Iodine   History: Past Medical History:  Diagnosis Date   Chronic pain    COPD ([place]ronic obstructive pulmonary disease) ([place]    told has copd, no current inhaler use   Cough    Depression    ETOH abuse    GERD (gastroesophageal reflux disease)    Hypertension    Hypothyroidism    Insomnia    Migraine    Migraine    Osteopenia    Pain management    Panic attacks  Small bowel obstruction ([place])    Past Surgical History:  Procedure Laterality Date   ABDOMINAL HYSTERECTOMY     COLON SURGERY     COLOSTOMY CLOSURE  04/19/2012   Procedure: COLOSTOMY CLOSURE;  Surgeon: Adin Hector, MD;  Location: [facility];  Service: General;  Laterality: N/A;  Laparotomy, Resection and Closure of Colostomy   COLOSTOMY TAKEDOWN N/A 04/12/2016   Procedure: [place] TAKEDOWN;  Surgeon: Johnathan Hausen, MD;  Location: [facility];  Service: General;  Laterality: N/A;   INCONTINENCE SURGERY     LAPAROTOMY  10/04/2011, colostomy also   Procedure: EXPLORATORY LAPAROTOMY;  Surgeon: Adin Hector, MD;  Location: [facility];  Service: General;  Laterality: N/A;  left partial colectomy with colostomy   LAPAROTOMY  04/19/2012   Procedure: EXPLORATORY LAPAROTOMY;  Surgeon: Adin Hector, MD;  Location: [facility];  Service: General;  Laterality: N/A;   LAPAROTOMY N/A 11/29/2015   Procedure: EXPLORATORY LAPAROTOMY; SUBTOTAL COLECTOMY WITH HARTMAN PROCEDURE AND END COLOSTOMY;  Surgeon: Johnathan Hausen, MD;  Location: [facility];  Service: General;  Laterality: N/A;   TUBAL LIGATION     VENTRAL HERNIA REPAIR  04/19/2012   Procedure: HERNIA REPAIR VENTRAL ADULT;  Surgeon: Adin Hector, MD;  Location: [facility];  Service: General;  Laterality: N/A;   Family History  Problem Relation Age of Onset   Heart disease Mother    Hypertension Mother    Cancer Sister        sinus   Diabetes Brother    Heart disease Brother    Thyroid disease Brother    Cancer Sister        abdominal ?   Thyroid disease Sister    Cancer Other        GE junction adenocarcinoma   Emphysema Father    Stroke Father    Hypertension Father    Heart disease Father    COPD Sister    Thyroid disease Sister    Thyroid disease Sister    Diabetes Brother    Thyroid disease Brother    Thyroid disease Brother    Hypertension Brother    Post-traumatic stress disorder Brother    COPD Brother    Thyroid disease Brother    Thyroid disease Brother    Hypertension Brother    Transient ischemic attack Brother    Social History   Socioeconomic History   Marital status: Widowed    Spouse name: Not on file   Number of children: 5   Years of education: 8   Highest education level: 8th grade  Occupational History   Occupation: Retired  Tobacco Use   Smoking status: Former    Packs/day: 1.50    Years: 20.00    Pack years: 30.00    Types: Cigarettes    Quit date: 10/04/1998    Years since quitting: 22.4   Smokeless tobacco: Never  Vaping Use   Vaping Use: Never used  Substance and Sexual Activity   Alcohol use: Not  Currently    Comment: 1bottle vodka -3 days. As of 03/01/21 pt  states she has quit drinking"for several days."   Drug use: No   Sexual activity: Yes  Other Topics Concern   Not on file  Social History Narrative   Pt lives alone. States 2 children live near by.   Social Determinants of Health   Financial Resource Strain: Low Risk    Difficulty of Paying Living Expenses: Not very hard  Food Insecurity: No Food Insecurity  Worried About Charity fundraiser in the Last Year: Never true   [place] in the Last Year: Never true  Transportation Needs: No Transportation Needs   Lack of Transportation (Medical): No   Lack of Transportation (Non-Medical): No  Physical Activity: Insufficiently Active   Days of Exercise per Week: 2 days   Minutes of Exercise per Session: 30 min  Stress: Stress Concern Present   Feeling of Stress : To some extent  Social Connections: Socially Isolated   Frequency of Communication with Friends and Family: Three times a week   Frequency of Social Gatherings with Friends and Family: Three times a week   Attends Religious Services: Never   Active Member of Clubs or Organizations: No   Attends Archivist Meetings: Never   Marital Status: Widowed    Tobacco Counseling Counseling given: Not Answered   Clinical Intake:  Pre-visit preparation completed: Yes  Pain : 0-10 Pain Score: 5  Pain Type: Chronic pain Pain Location: Back Pain Onset: More than a month ago Pain Frequency: Intermittent     BMI - recorded: 21.92 Nutritional Status: BMI of 19-24  Normal Nutritional Risks: None Diabetes: No  How often do you need to have someone help you when you read instructions, pamphlets, or other written materials from your doctor or pharmacy?: 1 - Never  Diabetic?NO  Interpreter Needed?: No  Information entered by :: Rody Keadle, LPN   Activities of Daily Living In your present state of health, do you have any difficulty performing  the following activities: 03/01/2021 02/04/2021  Hearing? N N  Vision? N N  Difficulty concentrating or making decisions? Y Y  Comment At time per pt. -  Walking or climbing stairs? N Y  Comment Pt statesshe does get out breath sometimes. -  Dressing or bathing? N N  Doing errands, shopping? N N  Preparing Food and eating ? N -  Using the Toilet? N -  In the past six months, have you accidently leaked urine? Y -  Comment Pt states she does have a UTI at this time.  Pt sates she has not discussed with Christy. -  Managing your Medications? N -  Managing your Finances? N -  Housekeeping or managing your Housekeeping? N -  Some recent data might be hidden    Patient Care Team: [name], FNP as PCP - General (Family Medicine) Marshell Garfinkel, MD as Consulting Physician (Pulmonary Disease) Shea Evans Norva Riffle, LCSW as [place] Management (Licensed Clinical Social Worker)  Indicate any recent [place] you may have received from other than [facility] providers in the past year (date may be approximate).     Assessment:   This is a routine wellness examination for [place].  Hearing/Vision screen Hearing Screening - Comments:: No hearing issues. Vision Screening - Comments:: Wears contacts. Sees eye doctor in [place]. Overdue  Dietary issues and exercise activities discussed: Current Exercise Habits: Structured exercise class, Type of exercise: Other - see comments (PT twice per week.), Time (Minutes): 30, Frequency (Times/Week): 2, Weekly Exercise (Minutes/Week): 60, Intensity: Mild, Exercise limited by: orthopedic condition(s);respiratory conditions(s)   Goals Addressed             This Visit's Progress    Patient Stated       Pt states she is trying to quit drinking.      Depression Screen PHQ 2/9 Scores 03/01/2021 02/23/2021 02/08/2021 01/26/2021 12/14/2020 12/14/2020 11/21/2020  PHQ - 2 Score 4 4  $'3 3 3 3 3  'v$ PHQ- 9 Score '12 14 10 10 11 11 10   '$ Exception Documentation - - - - - - -  Some recent data might be hidden    Fall Risk Fall Risk  03/01/2021 02/23/2021 02/23/2021 11/05/2020 08/27/2020  Falls in the past year? 1 1 0 1 1  Comment - - - - -  Number falls in past yr: 1 1 - 1 1  Injury with Fall? 1 1 - 1 1  Comment Pt reports bruising and cuts. Dislocated thumb with one fall. - - - -  Risk for fall due to : History of fall(s);Impaired balance/gait;Impaired vision History of fall(s);Impaired balance/gait - History of fall(s) History of fall(s)  Follow up Falls prevention discussed Falls evaluation completed - Education provided Education provided    [place]:  Any stairs in or around the home? No  If so, are there any without handrails? No  Home free of loose throw rugs in walkways, pet beds, electrical cords, etc? Yes  Adequate lighting in your home to reduce risk of falls? Yes   ASSISTIVE DEVICES UTILIZED TO PREVENT FALLS:  Life alert? No  Use of a cane, walker or w/c? Yes  Grab bars in the bathroom? No  Shower chair or bench in shower? Yes  Elevated toilet seat or a handicapped toilet? Yes   TIMED UP AND GO:  Was the test performed? No   Cognitive Function: MMSE - Mini Mental State Exam 05/23/2018 12/04/2016 06/16/2015  Orientation to time '5 5 5  '$ Orientation to Place '5 5 5  '$ Registration '3 3 3  '$ Attention/ Calculation '5 5 5  '$ Recall '2 2 3  '$ Language- name 2 objects '2 2 2  '$ Language- repeat '1 1 1  '$ Language- follow 3 step command '3 3 3  '$ Language- read & follow direction '1 1 1  '$ Write a sentence '1 1 1  '$ Copy design '1 1 1  '$ Total score '29 29 30     '$ 6CIT Screen 03/01/2021 09/02/2019  What Year? 0 points 0 points  What month? 0 points 0 points  What time? 0 points 0 points  Count back from 20 0 points 0 points  Months in reverse 0 points 0 points  Repeat phrase 0 points 4 points  Total Score 0 4    Immunizations Immunization History  Administered Date(s) Administered   Fluad  Quad(high Dose 65+) 05/27/2019, 06/22/2020   Influenza Whole 04/29/2012   Influenza, High Dose Seasonal PF 05/21/2018   Influenza,inj,Quad PF,6+ Mos 05/05/2013, 06/10/2014, 05/13/2015, 05/05/2016   Pneumococcal Conjugate-13 08/27/2020   Tdap 10/30/2020    TDAP status: Up to date  Flu Vaccine status: Due, Education has been provided regarding the importance of this vaccine. Advised may receive this vaccine at local pharmacy or Health Dept. Aware to provide a copy of the vaccination record if obtained from local pharmacy or Health Dept. Verbalized acceptance and understanding.  Pneumococcal vaccine status: Due, Education has been provided regarding the importance of this vaccine. Advised may receive this vaccine at local pharmacy or Health Dept. Aware to provide a copy of the vaccination record if obtained from local pharmacy or Health Dept. Verbalized acceptance and understanding.  Covid-19 vaccine status: Declined, Education has been provided regarding the importance of this vaccine but patient still declined. Advised may receive this vaccine at local pharmacy or Health Dept.or vaccine clinic. Aware to provide a copy of the vaccination record if obtained from local pharmacy  or Health Dept. Verbalized acceptance and understanding.  Qualifies for Shingles Vaccine? Yes   Zostavax completed No   Shingrix Completed?: No.    Education has been provided regarding the importance of this vaccine. Patient has been advised to call insurance company to determine out of pocket expense if they have not yet received this vaccine. Advised may also receive vaccine at local pharmacy or Health Dept. Verbalized acceptance and understanding.  Screening Tests Health Maintenance  Topic Date Due   INFLUENZA VACCINE  01/31/2021   COVID-19 Vaccine (1) 03/11/2021 (Originally 07/16/1955)   Zoster Vaccines- Shingrix (1 of 2) 06/01/2021 (Originally 07/17/1969)   MAMMOGRAM  11/05/2021 (Originally 07/16/1998)   DEXA SCAN   11/05/2021 (Originally 12/06/2018)   PNA vac Low Risk Adult (2 of 2 - PPSV23) 08/27/2021   TETANUS/TDAP  10/31/2030   Hepatitis C Screening  Completed   HPV VACCINES  Aged Out   COLONOSCOPY (Pts 45-52yr Insurance coverage will need to be confirmed)  Discontinued    Health Maintenance  Health Maintenance Due  Topic Date Due   INFLUENZA VACCINE  01/31/2021    Colorectal cancer screening: No longer required.   Mammogram: Pt declined  Bone Density status: Completed 12/05/2016. Results reflect: Bone density results: OSTEOPENIA. Repeat every 2 years.  Lung Cancer Screening: (Low Dose CT Chest recommended if Age 72-80years, 30 pack-year currently smoking OR have quit w/in 15years.) does not qualify.  Additional Screening:  Hepatitis C Screening: does not qualify; Completed 08/17/2015  Vision Screening: Recommended annual ophthalmology exams for early detection of glaucoma and other disorders of the eye. Is the patient up to date with their annual eye exam?  No  Who is the provider or what is the name of the office in which the patient attends annual eye exams[place] [facility] [place] not established with a provider, would they like to be referred to a provider to establish care? No .   Dental Screening: Recommended annual dental exams for proper oral hygiene  Community Resource Referral / Chronic Care Management: CRR required this visit?  No   CCM required this visit?  No      Plan:     I have personally reviewed and noted the following in the patient's chart:   Medical and social history Use of alcohol, tobacco or illicit drugs  Current medications and supplements including opioid prescriptions.  Functional ability and status Nutritional status Physical activity Advanced directives List of other physicians Hospitalizations, surgeries, and ER visits in previous 12 months Vitals Screenings to include cognitive, depression, and falls Referrals and  appointments  In addition, I have reviewed and discussed with patient certain preventive protocols, quality metrics, and best practice recommendations. A written personalized care plan for preventive services as well as general preventive health recommendations were provided to patient.     ASandrea Hammond LPN   [id number]  Nurse Notes: Pt states she is currently trying to quit drinking alcohol.

## 2021-03-03 ENCOUNTER — Other Ambulatory Visit: Payer: Self-pay

## 2021-03-03 ENCOUNTER — Other Ambulatory Visit: Payer: Self-pay | Admitting: Family

## 2021-03-03 ENCOUNTER — Other Ambulatory Visit: Payer: Self-pay | Admitting: Family Medicine

## 2021-03-03 ENCOUNTER — Ambulatory Visit (INDEPENDENT_AMBULATORY_CARE_PROVIDER_SITE_OTHER): Payer: Medicare Other

## 2021-03-03 DIAGNOSIS — I10 Essential (primary) hypertension: Secondary | ICD-10-CM

## 2021-03-03 DIAGNOSIS — J690 Pneumonitis due to inhalation of food and vomit: Secondary | ICD-10-CM

## 2021-03-03 DIAGNOSIS — Z9181 History of falling: Secondary | ICD-10-CM

## 2021-03-03 DIAGNOSIS — F1020 Alcohol dependence, uncomplicated: Secondary | ICD-10-CM | POA: Diagnosis not present

## 2021-03-03 DIAGNOSIS — J449 Chronic obstructive pulmonary disease, unspecified: Secondary | ICD-10-CM

## 2021-03-03 DIAGNOSIS — J9601 Acute respiratory failure with hypoxia: Secondary | ICD-10-CM | POA: Diagnosis not present

## 2021-03-03 DIAGNOSIS — F32A Depression, unspecified: Secondary | ICD-10-CM

## 2021-03-03 DIAGNOSIS — E785 Hyperlipidemia, unspecified: Secondary | ICD-10-CM

## 2021-03-03 DIAGNOSIS — E039 Hypothyroidism, unspecified: Secondary | ICD-10-CM

## 2021-03-03 DIAGNOSIS — E875 Hyperkalemia: Secondary | ICD-10-CM

## 2021-03-03 DIAGNOSIS — Z79891 Long term (current) use of opiate analgesic: Secondary | ICD-10-CM

## 2021-03-03 DIAGNOSIS — K219 Gastro-esophageal reflux disease without esophagitis: Secondary | ICD-10-CM

## 2021-03-03 DIAGNOSIS — F419 Anxiety disorder, unspecified: Secondary | ICD-10-CM

## 2021-03-03 LAB — URINE CULTURE

## 2021-03-03 MED ORDER — FLUCONAZOLE 150 MG PO TABS: 150.0000 mg | ORAL_TABLET | ORAL | 0 refills | Status: DC | PRN

## 2021-03-03 NOTE — Progress Notes (Signed)
Virtual Visit via Telephone Note  I connected with  Stacy Dalton on 03/01/2021 at  2:00 PM EDT by telephone and verified that I am speaking with the correct person using two identifiers.  Location: Patient: Home Provider: [facility] Persons participating in the virtual visit: patient/Nurse Health Advisor   I discussed the limitations, risks, security and privacy concerns of performing an evaluation and management service by telephone and the availability of in person appointments. The patient expressed understanding and agreed to proceed.  Interactive audio and video telecommunications were attempted between this nurse and patient, however failed, due to patient having technical difficulties OR patient did not have access to video capability.  We continued and completed visit with audio only.  Some vital signs may be absent or patient reported.   Hartley Urton Dionne Ano, LPN

## 2021-03-04 ENCOUNTER — Telehealth: Payer: Medicare Other

## 2021-03-09 ENCOUNTER — Other Ambulatory Visit: Payer: Medicare Other

## 2021-03-09 ENCOUNTER — Other Ambulatory Visit: Payer: Self-pay

## 2021-03-09 DIAGNOSIS — E875 Hyperkalemia: Secondary | ICD-10-CM | POA: Diagnosis not present

## 2021-03-10 ENCOUNTER — Telehealth: Payer: Self-pay | Admitting: Family

## 2021-03-10 LAB — CMP14+EGFR
ALT: 15 IU/L (ref 0–32)
AST: 32 IU/L (ref 0–40)
Albumin/Globulin Ratio: 2.3 — ABNORMAL HIGH (ref 1.2–2.2)
Albumin: 4.6 g/dL (ref 3.7–4.7)
Alkaline Phosphatase: 81 IU/L (ref 44–121)
BUN/Creatinine Ratio: 7 — ABNORMAL LOW (ref 12–28)
BUN: 12 mg/dL (ref 8–27)
Bilirubin Total: <0.2 mg/dL (ref 0.0–1.2)
CO2: 20 mmol/L (ref 20–29)
Calcium: 9.7 mg/dL (ref 8.7–10.3)
Chloride: 96 mmol/L (ref 96–106)
Creatinine, Ser: 1.79 mg/dL — ABNORMAL HIGH (ref 0.57–1.00)
Globulin, Total: 2 g/dL (ref 1.5–4.5)
Glucose: 83 mg/dL (ref 65–99)
Potassium: 4.2 mmol/L (ref 3.5–5.2)
Sodium: 139 mmol/L (ref 134–144)
Total Protein: 6.6 g/dL (ref 6.0–8.5)
eGFR: 30 mL/min/{1.73_m2} — ABNORMAL LOW (ref >59)

## 2021-03-10 MED ORDER — ONDANSETRON HCL 4 MG PO TABS: 4.0000 mg | ORAL_TABLET | Freq: Three times a day (TID) | ORAL | 0 refills | Status: DC | PRN

## 2021-03-10 NOTE — Telephone Encounter (Signed)
  Prescription Request  03/10/2021  Is this a "Controlled Substance" medicine? no Have you seen your PCP in the last 2 weeks? 02/23/2021 If YES, route message to pool  -  If NO, patient needs to be scheduled for appointment.  What is the name of the medication or equipment? Zofran '4mg'$  She takes zofran to help with nausea from taking all her other medications.  Have you contacted your pharmacy to request a refill? No  Which pharmacy would you like this sent to? [facility] [place]   Patient notified that their request is being sent to the clinical staff for review and that they should receive a response within 2 business days.

## 2021-03-10 NOTE — Telephone Encounter (Signed)
Zofran Prescription sent to pharmacy  ° °

## 2021-03-14 ENCOUNTER — Telehealth: Payer: Self-pay | Admitting: Family

## 2021-03-14 NOTE — Telephone Encounter (Signed)
Patient aware and verbalized understanding. °

## 2021-03-24 ENCOUNTER — Telehealth: Payer: Self-pay | Admitting: Family

## 2021-03-24 ENCOUNTER — Ambulatory Visit: Payer: Medicare Other | Admitting: Family

## 2021-03-24 MED ORDER — ONDANSETRON HCL 4 MG PO TABS: 4.0000 mg | ORAL_TABLET | Freq: Three times a day (TID) | ORAL | 0 refills | Status: DC | PRN

## 2021-03-24 NOTE — Telephone Encounter (Signed)
  Prescription Request  03/24/2021  Is this a "Controlled Substance" medicine? no Have you seen your PCP in the last 2 weeks? No  If YES, route message to pool  -  If NO, patient needs to be scheduled for appointment.  What is the name of the medication or equipment? Zofram  Have you contacted your pharmacy to request a refill?  yes  Which pharmacy would you like this sent to? [facility] in [place]   Patient notified that their request is being sent to the clinical staff for review and that they should receive a response within 2 business days.    Stacy Dalton' pt.  Please call pt.

## 2021-03-24 NOTE — Telephone Encounter (Signed)
Zofran Prescription sent to pharmacy  ° °

## 2021-03-26 DIAGNOSIS — E876 Hypokalemia: Secondary | ICD-10-CM | POA: Diagnosis not present

## 2021-03-26 DIAGNOSIS — R110 Nausea: Secondary | ICD-10-CM | POA: Diagnosis not present

## 2021-03-26 DIAGNOSIS — D649 Anemia, unspecified: Secondary | ICD-10-CM | POA: Diagnosis not present

## 2021-03-26 DIAGNOSIS — R0902 Hypoxemia: Secondary | ICD-10-CM | POA: Diagnosis not present

## 2021-03-26 DIAGNOSIS — I700 Atherosclerosis of aorta: Secondary | ICD-10-CM | POA: Diagnosis not present

## 2021-03-26 DIAGNOSIS — Z87891 Personal history of nicotine dependence: Secondary | ICD-10-CM | POA: Diagnosis not present

## 2021-03-26 DIAGNOSIS — J449 Chronic obstructive pulmonary disease, unspecified: Secondary | ICD-10-CM | POA: Diagnosis not present

## 2021-03-26 DIAGNOSIS — R1084 Generalized abdominal pain: Secondary | ICD-10-CM | POA: Diagnosis not present

## 2021-03-26 DIAGNOSIS — R109 Unspecified abdominal pain: Secondary | ICD-10-CM | POA: Diagnosis not present

## 2021-03-26 DIAGNOSIS — M549 Dorsalgia, unspecified: Secondary | ICD-10-CM | POA: Diagnosis not present

## 2021-03-26 DIAGNOSIS — R112 Nausea with vomiting, unspecified: Secondary | ICD-10-CM | POA: Diagnosis not present

## 2021-03-26 DIAGNOSIS — K920 Hematemesis: Secondary | ICD-10-CM | POA: Diagnosis not present

## 2021-03-26 DIAGNOSIS — I10 Essential (primary) hypertension: Secondary | ICD-10-CM | POA: Diagnosis not present

## 2021-03-26 DIAGNOSIS — R1111 Vomiting without nausea: Secondary | ICD-10-CM | POA: Diagnosis not present

## 2021-03-26 DIAGNOSIS — G8929 Other chronic pain: Secondary | ICD-10-CM | POA: Diagnosis not present

## 2021-03-26 DIAGNOSIS — Z20822 Contact with and (suspected) exposure to covid-19: Secondary | ICD-10-CM | POA: Diagnosis not present

## 2021-03-26 DIAGNOSIS — E079 Disorder of thyroid, unspecified: Secondary | ICD-10-CM | POA: Diagnosis not present

## 2021-04-04 ENCOUNTER — Encounter: Payer: Self-pay | Admitting: Family

## 2021-04-04 ENCOUNTER — Ambulatory Visit: Payer: Medicare Other | Admitting: Family

## 2021-04-08 ENCOUNTER — Ambulatory Visit (INDEPENDENT_AMBULATORY_CARE_PROVIDER_SITE_OTHER): Payer: Medicare Other | Admitting: Licensed Clinical Social Worker

## 2021-04-08 DIAGNOSIS — F321 Major depressive disorder, single episode, moderate: Secondary | ICD-10-CM

## 2021-04-08 DIAGNOSIS — R079 Chest pain, unspecified: Secondary | ICD-10-CM | POA: Diagnosis not present

## 2021-04-08 DIAGNOSIS — M549 Dorsalgia, unspecified: Secondary | ICD-10-CM | POA: Diagnosis not present

## 2021-04-08 DIAGNOSIS — R4182 Altered mental status, unspecified: Secondary | ICD-10-CM | POA: Diagnosis not present

## 2021-04-08 DIAGNOSIS — I700 Atherosclerosis of aorta: Secondary | ICD-10-CM | POA: Diagnosis not present

## 2021-04-08 DIAGNOSIS — I10 Essential (primary) hypertension: Secondary | ICD-10-CM

## 2021-04-08 DIAGNOSIS — G4700 Insomnia, unspecified: Secondary | ICD-10-CM

## 2021-04-08 DIAGNOSIS — Z9889 Other specified postprocedural states: Secondary | ICD-10-CM | POA: Diagnosis not present

## 2021-04-08 DIAGNOSIS — F419 Anxiety disorder, unspecified: Secondary | ICD-10-CM

## 2021-04-08 DIAGNOSIS — K219 Gastro-esophageal reflux disease without esophagitis: Secondary | ICD-10-CM

## 2021-04-08 DIAGNOSIS — K6389 Other specified diseases of intestine: Secondary | ICD-10-CM | POA: Diagnosis not present

## 2021-04-08 DIAGNOSIS — E785 Hyperlipidemia, unspecified: Secondary | ICD-10-CM

## 2021-04-08 DIAGNOSIS — R0789 Other chest pain: Secondary | ICD-10-CM | POA: Diagnosis not present

## 2021-04-08 DIAGNOSIS — E039 Hypothyroidism, unspecified: Secondary | ICD-10-CM

## 2021-04-08 DIAGNOSIS — R0689 Other abnormalities of breathing: Secondary | ICD-10-CM | POA: Diagnosis not present

## 2021-04-08 DIAGNOSIS — R0902 Hypoxemia: Secondary | ICD-10-CM | POA: Diagnosis not present

## 2021-04-08 DIAGNOSIS — J449 Chronic obstructive pulmonary disease, unspecified: Secondary | ICD-10-CM

## 2021-04-08 DIAGNOSIS — R109 Unspecified abdominal pain: Secondary | ICD-10-CM | POA: Diagnosis not present

## 2021-04-08 NOTE — Chronic Care Management (AMB) (Signed)
Chronic Care Management    Clinical Social Work Note  04/08/2021 Name: Stacy Dalton MRN: [id number] DOB: 09-08-1950  Stacy Dalton is a 70 y.o. year old female who is a primary care patient of [name], FNP. The CCM team was consulted to assist the patient with chronic disease management and/or care coordination needs related to: [name] .   Engaged with patient Stacy Dalton of patient, Stacy Dalton , by telephone for follow up visit in response to provider referral for social work chronic care management and care coordination services.   Consent to Services:  The patient was given information about Chronic Care Management services, agreed to services, and gave verbal consent prior to initiation of services.  Please see initial visit note for detailed documentation.   Patient agreed to services and consent obtained.   Assessment: Review of patient past medical history, allergies, medications, and health status, including review of relevant consultants reports was performed today as part of a comprehensive evaluation and provision of chronic care management and care coordination services.     SDOH (Social Determinants of Health) assessments and interventions performed:  SDOH Interventions    Flowsheet Row Most Recent Value  SDOH Interventions   Physical Activity Interventions Other (Comments)  [client has walking challenges. She had a fall in the past 3 weeks.She has a walker to use as needed for ambulation]  Depression Interventions/Treatment  Currently on Treatment        Advanced Directives Status: See [employer] application for related entries.  CCM Care Plan  Allergies  Allergen Reactions   Librium [Chlordiazepoxide]     Became unresponsive   Toradol [Ketorolac Tromethamine] Shortness Of Breath   Butalbital-Apap-Caffeine Other (See Comments)    jittery   Demerol Swelling   Esgic [Butalbital-Apap-Caffeine] Other (See Comments)    jittery   Iodine Other (See  Comments)    bp bottomed out per pt several hours later; unsure if pre medicated in past with cm; done in [place]    Outpatient Encounter Medications as of 04/08/2021  Medication Sig   amLODipine-olmesartan (AZOR) 10-40 MG tablet Take 1 tablet by mouth daily.   amoxicillin-clavulanate (AUGMENTIN) 875-125 MG tablet Take 1 tablet by mouth 2 (two) times daily.   Aspirin-Caffeine (BC FAST PAIN RELIEF) 845-65 MG PACK Take 1 packet by mouth daily as needed (pain or headache).   chlordiazePOXIDE (LIBRIUM) 10 MG capsule Take 3 tablets by mouth daily X2 days; then 2 tablets by mouth daily X 2 days, then 1 tablet by mouth daily X 3 days and stop librium.   cyanocobalamin (,VITAMIN B-12,) 1000 MCG/ML injection Inject 1000 mcg IM daily X 5 days; then weekly for a month and then monthly.   DULoxetine (CYMBALTA) 60 MG capsule Take 1 capsule (60 mg total) by mouth daily.   fluconazole (DIFLUCAN) 150 MG tablet Take 1 tablet (150 mg total) by mouth every three (3) days as needed.   fluticasone furoate-vilanterol (BREO ELLIPTA) 200-25 MCG/INH AEPB Inhale 1 puff into the lungs daily.   folic acid (FOLVITE) 1 MG tablet Take 1 tablet (1 mg total) by mouth daily.   levothyroxine (SYNTHROID) 50 MCG tablet Take 1 tablet (50 mcg total) by mouth daily before breakfast.   magnesium oxide (MAG-OX) 400 (241.3 Mg) MG tablet Take 1 tablet (400 mg total) by mouth 2 (two) times daily.   Multiple Vitamin (MULTIVITAMIN WITH MINERALS) TABS tablet Take 1 tablet by mouth daily.   ondansetron (ZOFRAN) 4 MG tablet Take 1 tablet (  4 mg total) by mouth every 8 (eight) hours as needed for nausea or vomiting.   pantoprazole (PROTONIX) 40 MG tablet Take 1 tablet (40 mg total) by mouth daily.   predniSONE (STERAPRED UNI-PAK 21 TAB) 10 MG (21) TBPK tablet Use as directed   thiamine 100 MG tablet Take 1 tablet (100 mg total) by mouth daily.   Vitamin D, Ergocalciferol, (DRISDOL) 1.25 MG (50000 UNIT) CAPS capsule Take 1 capsule (50,000 Units  total) by mouth every 7 (seven) days.   No facility-administered encounter medications on file as of 04/08/2021.    Patient Active Problem List   Diagnosis Date Noted   Physical deconditioning    Vitamin B12 deficiency 02/04/2021   Leukocytosis 11/23/2020   Alcohol use disorder, severe, dependence ([place]) 10/08/2020   Hyponatremia 09/17/2020   Alcohol withdrawal ([place]) 09/12/2020   Chronic bronchitis ([place]) 08/20/2020   Moderate protein malnutrition ([facility]) 08/20/2020   Chronic low back pain 08/19/2020   Elevated SGOT ([facility]) 08/19/2020   Elevated troponin 08/19/2020   Increased serum lipase level 08/19/2020   Alcohol abuse 06/21/2020   Multifocal pneumonia 09/19/2018   Large bowel obstruction ([facility])    Small bowel obstruction ([place]) 07/20/2018   Hypomagnesemia 07/20/2018   Cough 03/14/2018   Fatigue 03/14/2018   Polypharmacy 02/23/2017   Hypokalemia 10/93/2355   Toxic metabolic encephalopathy 73/22/0254   Anxiety 02/22/2017   GERD (gastroesophageal reflux disease) 02/22/2017   Pain medication agreement signed 10/10/2016   Opioid dependence ([place]) 10/10/2016   Status post colostomy takedown 04/12/2016   Chronic constipation 12/03/2015   Peritonitis with abscess of intestine ([place]) 11/29/2015   COPD exacerbation ([place]) 09/20/2015   Osteopenia 06/10/2014   Vitamin D deficiency 06/10/2014   Hypothyroidism    Depression 11/18/2012   Insomnia 11/18/2012   Chronic pain syndrome 11/18/2012   HLD (hyperlipidemia) 09/01/2012   S/P colostomy ([place]) 04/19/2012   Normocytic anemia 10/15/2011   Hypertension 10/13/2011   Chronic back pain 10/13/2011   COPD (chronic obstructive pulmonary disease) ([place]) 10/09/2011    Conditions to be addressed/monitored: monitor client management of anxiety and stress issues   Care Plan : LCSW Care Plan  Updates made by Katha Cabal, LCSW since 04/08/2021 12:00 AM     Problem: Emotional Distress      Goal: Emotional Health Supported: Manage  depression; Manage Alcohol Usage   Start Date: 02/08/2021  Expected End Date: 06/09/2021  This Visit's Progress: Not on track  Recent Progress: On track  Priority: High  Note:   Current Barriers:  Chronic Mental Health needs related to management of anxiety and stress issues Suicidal Ideation/Homicidal Ideation: No Hx of Alcohol Use/Abuse  Clinical Social Work Goal(s):  patient will work with SW monthly by telephone or in person to reduce or manage symptoms related to anxiety and stress issues faced Client will call LCSW in next 30 days as needed to discuss LCSW support with CCM program  Interventions: 1:1 collaboration with  [name], FNP regarding development and update of comprehensive plan of care as evidenced by provider attestation and co-signature LCSW spoke via phone today with Stacy Dalton, son of client, to discuss client needs LCSW and Stacy Dalton spoke of family support for client (her son , Stacy Dalton, is supportive; her son, Stacy Dalton, is supportive) Discussed with Stacy Dalton the decreased appetite of client Reviewed with Stacy Dalton the CCM program support services for client. Discussed fall history of client with Stacy Dalton. Stacy Dalton reported that client had a fall in her home recently and  was sore due to her recent fall. Reviewed with Stacy Dalton client walking challenges. Stacy Dalton said that client does have a walker to use as needed for walking assistance. Discussed with Stacy Dalton the current pain issues of client. Per Stacy Dalton, client is having back pain and chest pain issues.  He thought chest pain issues may be related to her recent fall and soreness. Encouraged Stacy Dalton to call [facility] as needed to talk with Triage nurse about nursing needs of client  Patient Self Care Activities:  Takes medications as prescribed  Patient Coping Strengths:  Has family support from her son, Stacy Dalton  Patient Self Care Deficits:  Hx Alcohol Use/Abuse  Patient Goals:  - spend time or talk with others every day - practice relaxation or  meditation daily - keep a calendar with appointment dates  Follow Up Plan: LCSW to call client or son, Stacy Dalton, on 06/01/21 at 2:30 PM to assess client needs       Norva Riffle.Tynetta Bachmann MSW, LCSW Licensed Clinical Social Worker [facility] Care Management [phone]

## 2021-04-08 NOTE — Patient Instructions (Signed)
Visit Information  PATIENT GOALS:  Goals Addressed             This Visit's Progress    Manage Anxiety and Stress issues faced. Manage depression issues faced       Timeframe:  Short-Term Goal Priority:  High Progress: Not On Track Start Date:   02/08/21                   Expected End Date:  06/05/21                Follow Up Date 06/01/21 at 2:30 PM   Manage My Emotions (Patient) Manage Anxiety and Stress Symptoms Faced . Manage depression issues faced   Why is this important?   When you are stressed, down or upset, your body reacts too.  For example, your blood pressure may get higher; you may have a headache or stomachache.  When your emotions get the best of you, your body's ability to fight off cold and flu gets weak.  These steps will help you manage your emotions.     Patient Self Care Activities:  Takes medications as prescribed  Patient Coping Strengths:  Has some family support from her son, [name]  Patient Self Care Deficits:  Hx Alcohol Use/Abuse  Patient Goals:  - spend time or talk with others every day - practice relaxation or meditation daily - keep a calendar with appointment dates  Follow Up Plan: LCSW to call client or son, Neldon Newport, on 06/01/21 at 2:30 PM to assess client needs.      Norva Riffle.Ashten Prats MSW, LCSW Licensed Clinical Social Worker [facility] Care Management [phone]

## 2021-04-14 ENCOUNTER — Emergency Department (HOSPITAL_COMMUNITY): Payer: Medicare Other

## 2021-04-14 ENCOUNTER — Inpatient Hospital Stay (HOSPITAL_COMMUNITY): Payer: Medicare Other

## 2021-04-14 ENCOUNTER — Other Ambulatory Visit: Payer: Self-pay

## 2021-04-14 ENCOUNTER — Inpatient Hospital Stay (HOSPITAL_COMMUNITY)
Admission: EM | Admit: 2021-04-14 | Discharge: 2021-04-19 | DRG: 896 | Disposition: A | Discharge status: Skilled Nursing Facility | Payer: Medicare Other | Attending: Family Medicine | Admitting: Family Medicine

## 2021-04-14 ENCOUNTER — Encounter (HOSPITAL_COMMUNITY): Payer: Self-pay

## 2021-04-14 DIAGNOSIS — E538 Deficiency of other specified B group vitamins: Secondary | ICD-10-CM | POA: Diagnosis present

## 2021-04-14 DIAGNOSIS — E785 Hyperlipidemia, unspecified: Secondary | ICD-10-CM | POA: Diagnosis present

## 2021-04-14 DIAGNOSIS — U071 COVID-19: Secondary | ICD-10-CM | POA: Diagnosis present

## 2021-04-14 DIAGNOSIS — K7010 Alcoholic hepatitis without ascites: Secondary | ICD-10-CM | POA: Diagnosis present

## 2021-04-14 DIAGNOSIS — M8580 Other specified disorders of bone density and structure, unspecified site: Secondary | ICD-10-CM | POA: Diagnosis present

## 2021-04-14 DIAGNOSIS — F1010 Alcohol abuse, uncomplicated: Secondary | ICD-10-CM | POA: Diagnosis present

## 2021-04-14 DIAGNOSIS — R000 Tachycardia, unspecified: Secondary | ICD-10-CM | POA: Diagnosis not present

## 2021-04-14 DIAGNOSIS — Z79899 Other long term (current) drug therapy: Secondary | ICD-10-CM | POA: Diagnosis not present

## 2021-04-14 DIAGNOSIS — F10930 Alcohol use, unspecified with withdrawal, uncomplicated: Secondary | ICD-10-CM

## 2021-04-14 DIAGNOSIS — F410 Panic disorder [episodic paroxysmal anxiety] without agoraphobia: Secondary | ICD-10-CM | POA: Diagnosis present

## 2021-04-14 DIAGNOSIS — Z8349 Family history of other endocrine, nutritional and metabolic diseases: Secondary | ICD-10-CM

## 2021-04-14 DIAGNOSIS — M50322 Other cervical disc degeneration at C5-C6 level: Secondary | ICD-10-CM | POA: Diagnosis not present

## 2021-04-14 DIAGNOSIS — F10939 Alcohol use, unspecified with withdrawal, unspecified: Secondary | ICD-10-CM | POA: Diagnosis not present

## 2021-04-14 DIAGNOSIS — F321 Major depressive disorder, single episode, moderate: Secondary | ICD-10-CM | POA: Diagnosis not present

## 2021-04-14 DIAGNOSIS — G4700 Insomnia, unspecified: Secondary | ICD-10-CM | POA: Diagnosis present

## 2021-04-14 DIAGNOSIS — E876 Hypokalemia: Secondary | ICD-10-CM | POA: Diagnosis present

## 2021-04-14 DIAGNOSIS — Z888 Allergy status to other drugs, medicaments and biological substances status: Secondary | ICD-10-CM

## 2021-04-14 DIAGNOSIS — J449 Chronic obstructive pulmonary disease, unspecified: Secondary | ICD-10-CM | POA: Diagnosis present

## 2021-04-14 DIAGNOSIS — K219 Gastro-esophageal reflux disease without esophagitis: Secondary | ICD-10-CM | POA: Diagnosis present

## 2021-04-14 DIAGNOSIS — R296 Repeated falls: Secondary | ICD-10-CM | POA: Diagnosis present

## 2021-04-14 DIAGNOSIS — S199XXA Unspecified injury of neck, initial encounter: Secondary | ICD-10-CM | POA: Diagnosis not present

## 2021-04-14 DIAGNOSIS — G43909 Migraine, unspecified, not intractable, without status migrainosus: Secondary | ICD-10-CM | POA: Diagnosis present

## 2021-04-14 DIAGNOSIS — J42 Unspecified chronic bronchitis: Secondary | ICD-10-CM

## 2021-04-14 DIAGNOSIS — F32A Depression, unspecified: Secondary | ICD-10-CM | POA: Diagnosis present

## 2021-04-14 DIAGNOSIS — Z043 Encounter for examination and observation following other accident: Secondary | ICD-10-CM | POA: Diagnosis not present

## 2021-04-14 DIAGNOSIS — M25562 Pain in left knee: Secondary | ICD-10-CM | POA: Diagnosis not present

## 2021-04-14 DIAGNOSIS — F10230 Alcohol dependence with withdrawal, uncomplicated: Secondary | ICD-10-CM | POA: Diagnosis not present

## 2021-04-14 DIAGNOSIS — Y903 Blood alcohol level of 60-79 mg/100 ml: Secondary | ICD-10-CM | POA: Diagnosis present

## 2021-04-14 DIAGNOSIS — F419 Anxiety disorder, unspecified: Secondary | ICD-10-CM | POA: Diagnosis not present

## 2021-04-14 DIAGNOSIS — R262 Difficulty in walking, not elsewhere classified: Secondary | ICD-10-CM

## 2021-04-14 DIAGNOSIS — M4802 Spinal stenosis, cervical region: Secondary | ICD-10-CM | POA: Diagnosis not present

## 2021-04-14 DIAGNOSIS — J441 Chronic obstructive pulmonary disease with (acute) exacerbation: Secondary | ICD-10-CM

## 2021-04-14 DIAGNOSIS — I10 Essential (primary) hypertension: Secondary | ICD-10-CM | POA: Diagnosis present

## 2021-04-14 DIAGNOSIS — R079 Chest pain, unspecified: Secondary | ICD-10-CM | POA: Diagnosis not present

## 2021-04-14 DIAGNOSIS — G894 Chronic pain syndrome: Secondary | ICD-10-CM | POA: Diagnosis present

## 2021-04-14 DIAGNOSIS — R0602 Shortness of breath: Secondary | ICD-10-CM

## 2021-04-14 DIAGNOSIS — D509 Iron deficiency anemia, unspecified: Secondary | ICD-10-CM | POA: Diagnosis present

## 2021-04-14 DIAGNOSIS — J984 Other disorders of lung: Secondary | ICD-10-CM | POA: Diagnosis not present

## 2021-04-14 DIAGNOSIS — R5381 Other malaise: Secondary | ICD-10-CM | POA: Diagnosis not present

## 2021-04-14 DIAGNOSIS — E039 Hypothyroidism, unspecified: Secondary | ICD-10-CM | POA: Diagnosis present

## 2021-04-14 DIAGNOSIS — Z79890 Hormone replacement therapy (postmenopausal): Secondary | ICD-10-CM | POA: Diagnosis not present

## 2021-04-14 DIAGNOSIS — F1020 Alcohol dependence, uncomplicated: Secondary | ICD-10-CM | POA: Diagnosis present

## 2021-04-14 DIAGNOSIS — R0689 Other abnormalities of breathing: Secondary | ICD-10-CM | POA: Diagnosis not present

## 2021-04-14 DIAGNOSIS — Z8249 Family history of ischemic heart disease and other diseases of the circulatory system: Secondary | ICD-10-CM | POA: Diagnosis not present

## 2021-04-14 DIAGNOSIS — E559 Vitamin D deficiency, unspecified: Secondary | ICD-10-CM | POA: Diagnosis present

## 2021-04-14 DIAGNOSIS — R0781 Pleurodynia: Secondary | ICD-10-CM | POA: Diagnosis not present

## 2021-04-14 DIAGNOSIS — Z825 Family history of asthma and other chronic lower respiratory diseases: Secondary | ICD-10-CM

## 2021-04-14 DIAGNOSIS — I959 Hypotension, unspecified: Secondary | ICD-10-CM | POA: Diagnosis not present

## 2021-04-14 DIAGNOSIS — R410 Disorientation, unspecified: Secondary | ICD-10-CM | POA: Diagnosis not present

## 2021-04-14 DIAGNOSIS — Z28310 Unvaccinated for covid-19: Secondary | ICD-10-CM

## 2021-04-14 DIAGNOSIS — Z7401 Bed confinement status: Secondary | ICD-10-CM | POA: Diagnosis not present

## 2021-04-14 LAB — CBC WITH DIFFERENTIAL/PLATELET
Abs Immature Granulocytes: 0.05 10*3/uL (ref 0.00–0.07)
Basophils Absolute: 0.1 10*3/uL (ref 0.0–0.1)
Basophils Relative: 1 %
Eosinophils Absolute: 0 10*3/uL (ref 0.0–0.5)
Eosinophils Relative: 0 %
HCT: 30.5 % — ABNORMAL LOW (ref 36.0–46.0)
Hemoglobin: 10.2 g/dL — ABNORMAL LOW (ref 12.0–15.0)
Immature Granulocytes: 1 %
Lymphocytes Relative: 11 %
Lymphs Abs: 0.9 10*3/uL (ref 0.7–4.0)
MCH: 27.1 pg (ref 26.0–34.0)
MCHC: 33.4 g/dL (ref 30.0–36.0)
MCV: 80.9 fL (ref 80.0–100.0)
Monocytes Absolute: 0.6 10*3/uL (ref 0.1–1.0)
Monocytes Relative: 7 %
Neutro Abs: 6.6 10*3/uL (ref 1.7–7.7)
Neutrophils Relative %: 80 %
Platelets: 278 10*3/uL (ref 150–400)
RBC: 3.77 MIL/uL — ABNORMAL LOW (ref 3.87–5.11)
RDW: 17.3 % — ABNORMAL HIGH (ref 11.5–15.5)
WBC: 8.2 10*3/uL (ref 4.0–10.5)
nRBC: 0 % (ref 0.0–0.2)

## 2021-04-14 LAB — COMPREHENSIVE METABOLIC PANEL
ALT: 25 U/L (ref 0–44)
AST: 55 U/L — ABNORMAL HIGH (ref 15–41)
Albumin: 3.4 g/dL — ABNORMAL LOW (ref 3.5–5.0)
Alkaline Phosphatase: 154 U/L — ABNORMAL HIGH (ref 38–126)
Anion gap: 17 — ABNORMAL HIGH (ref 5–15)
BUN: 16 mg/dL (ref 8–23)
CO2: 19 mmol/L — ABNORMAL LOW (ref 22–32)
Calcium: 7.4 mg/dL — ABNORMAL LOW (ref 8.9–10.3)
Chloride: 100 mmol/L (ref 98–111)
Creatinine, Ser: 1.03 mg/dL — ABNORMAL HIGH (ref 0.44–1.00)
GFR, Estimated: 58 mL/min — ABNORMAL LOW (ref >60)
Glucose, Bld: 119 mg/dL — ABNORMAL HIGH (ref 70–99)
Potassium: 2.8 mmol/L — ABNORMAL LOW (ref 3.5–5.1)
Sodium: 136 mmol/L (ref 135–145)
Total Bilirubin: 1.3 mg/dL — ABNORMAL HIGH (ref 0.3–1.2)
Total Protein: 6.1 g/dL — ABNORMAL LOW (ref 6.5–8.1)

## 2021-04-14 LAB — URINALYSIS, ROUTINE W REFLEX MICROSCOPIC
Bilirubin Urine: NEGATIVE
Glucose, UA: NEGATIVE mg/dL
Ketones, ur: 5 mg/dL — AB
Nitrite: NEGATIVE
Protein, ur: 30 mg/dL — AB
Specific Gravity, Urine: 1.013 (ref 1.005–1.030)
pH: 8 (ref 5.0–8.0)

## 2021-04-14 LAB — CBG MONITORING, ED: Glucose-Capillary: 118 mg/dL — ABNORMAL HIGH (ref 70–99)

## 2021-04-14 LAB — MRSA NEXT GEN BY PCR, NASAL: MRSA by PCR Next Gen: NOT DETECTED

## 2021-04-14 LAB — PROTIME-INR
INR: 1.1 (ref 0.8–1.2)
Prothrombin Time: 13.8 seconds (ref 11.4–15.2)

## 2021-04-14 LAB — AMMONIA: Ammonia: 13 umol/L (ref 9–35)

## 2021-04-14 LAB — RAPID URINE DRUG SCREEN, HOSP PERFORMED
Amphetamines: NOT DETECTED
Barbiturates: NOT DETECTED
Benzodiazepines: NOT DETECTED
Cocaine: NOT DETECTED
Opiates: POSITIVE — AB
Tetrahydrocannabinol: NOT DETECTED

## 2021-04-14 LAB — ETHANOL: Alcohol, Ethyl (B): 73 mg/dL — ABNORMAL HIGH (ref <10)

## 2021-04-14 LAB — TSH: TSH: 2.85 u[IU]/mL (ref 0.350–4.500)

## 2021-04-14 LAB — RESP PANEL BY RT-PCR (FLU A&B, COVID) ARPGX2
Influenza A by PCR: NEGATIVE
Influenza B by PCR: NEGATIVE
SARS Coronavirus 2 by RT PCR: POSITIVE — AB

## 2021-04-14 LAB — CK: Total CK: 171 U/L (ref 38–234)

## 2021-04-14 LAB — BRAIN NATRIURETIC PEPTIDE: B Natriuretic Peptide: 22 pg/mL (ref 0.0–100.0)

## 2021-04-14 LAB — MAGNESIUM: Magnesium: 1.1 mg/dL — ABNORMAL LOW (ref 1.7–2.4)

## 2021-04-14 MED ORDER — LORAZEPAM 2 MG/ML IJ SOLN
0.0000 mg | Freq: Four times a day (QID) | INTRAMUSCULAR | Status: DC
  Administered 2021-04-14: 2 mg via INTRAVENOUS
  Filled 2021-04-14: qty 1

## 2021-04-14 MED ORDER — LORAZEPAM 2 MG/ML IJ SOLN
1.0000 mg | INTRAMUSCULAR | Status: DC | PRN
Start: 2021-04-14 — End: 2021-04-19
  Administered 2021-04-14 – 2021-04-17 (×9): 2 mg via INTRAVENOUS
  Filled 2021-04-14 (×9): qty 1

## 2021-04-14 MED ORDER — POTASSIUM CHLORIDE 10 MEQ/100ML IV SOLN
10.0000 meq | Freq: Once | INTRAVENOUS | Status: AC
  Administered 2021-04-14: 10 meq via INTRAVENOUS
  Filled 2021-04-14: qty 100

## 2021-04-14 MED ORDER — ACETAMINOPHEN 650 MG RE SUPP: 650.0000 mg | Freq: Four times a day (QID) | RECTAL | Status: DC | PRN

## 2021-04-14 MED ORDER — IPRATROPIUM BROMIDE 0.02 % IN SOLN: 0.5000 mg | Freq: Four times a day (QID) | RESPIRATORY_TRACT | Status: DC | PRN

## 2021-04-14 MED ORDER — MORPHINE SULFATE (PF) 4 MG/ML IV SOLN
4.0000 mg | Freq: Once | INTRAVENOUS | Status: AC
  Administered 2021-04-14: 4 mg via INTRAVENOUS
  Filled 2021-04-14: qty 1

## 2021-04-14 MED ORDER — LEVALBUTEROL HCL 0.63 MG/3ML IN NEBU: 0.6300 mg | INHALATION_SOLUTION | Freq: Four times a day (QID) | RESPIRATORY_TRACT | Status: DC | PRN

## 2021-04-14 MED ORDER — SODIUM CHLORIDE 0.9 % IV BOLUS
1000.0000 mL | Freq: Once | INTRAVENOUS | Status: AC
  Administered 2021-04-14: 1000 mL via INTRAVENOUS

## 2021-04-14 MED ORDER — LEVOTHYROXINE SODIUM 25 MCG PO TABS
50.0000 ug | ORAL_TABLET | Freq: Every day | ORAL | Status: DC
  Administered 2021-04-15 – 2021-04-19 (×5): 50 ug via ORAL
  Filled 2021-04-14 (×5): qty 2

## 2021-04-14 MED ORDER — ONDANSETRON HCL 4 MG/2ML IJ SOLN
4.0000 mg | Freq: Four times a day (QID) | INTRAMUSCULAR | Status: DC | PRN
  Administered 2021-04-14 – 2021-04-15 (×2): 4 mg via INTRAVENOUS
  Filled 2021-04-14 (×2): qty 2

## 2021-04-14 MED ORDER — DEXMEDETOMIDINE HCL IN NACL 200 MCG/50ML IV SOLN
0.2000 ug/kg/h | INTRAVENOUS | Status: DC
  Filled 2021-04-14: qty 50

## 2021-04-14 MED ORDER — SODIUM CHLORIDE 0.9 % IV SOLN: 250.0000 mL | INTRAVENOUS | Status: DC | PRN

## 2021-04-14 MED ORDER — ONDANSETRON HCL 4 MG/2ML IJ SOLN
4.0000 mg | Freq: Once | INTRAMUSCULAR | Status: AC
  Administered 2021-04-14: 4 mg via INTRAVENOUS
  Filled 2021-04-14: qty 2

## 2021-04-14 MED ORDER — FLUTICASONE FUROATE-VILANTEROL 200-25 MCG/INH IN AEPB
1.0000 | INHALATION_SPRAY | Freq: Every day | RESPIRATORY_TRACT | Status: DC
  Administered 2021-04-15 – 2021-04-19 (×5): 1 via RESPIRATORY_TRACT
  Filled 2021-04-14: qty 28

## 2021-04-14 MED ORDER — BISACODYL 5 MG PO TBEC: 5.0000 mg | DELAYED_RELEASE_TABLET | Freq: Every day | ORAL | Status: DC | PRN

## 2021-04-14 MED ORDER — VITAMIN D (ERGOCALCIFEROL) 1.25 MG (50000 UNIT) PO CAPS
50000.0000 [IU] | ORAL_CAPSULE | ORAL | Status: DC
  Administered 2021-04-17: 50000 [IU] via ORAL
  Filled 2021-04-14 (×2): qty 1

## 2021-04-14 MED ORDER — DULOXETINE HCL 60 MG PO CPEP
60.0000 mg | ORAL_CAPSULE | Freq: Every day | ORAL | Status: DC
  Administered 2021-04-14 – 2021-04-19 (×6): 60 mg via ORAL
  Filled 2021-04-14 (×2): qty 1
  Filled 2021-04-14: qty 2
  Filled 2021-04-14 (×3): qty 1

## 2021-04-14 MED ORDER — SODIUM CHLORIDE 0.9 % IV SOLN: INTRAVENOUS | Status: DC

## 2021-04-14 MED ORDER — SODIUM CHLORIDE 0.9% FLUSH: 3.0000 mL | Freq: Two times a day (BID) | INTRAVENOUS | Status: DC

## 2021-04-14 MED ORDER — MAGNESIUM SULFATE 2 GM/50ML IV SOLN
2.0000 g | Freq: Once | INTRAVENOUS | Status: AC
  Administered 2021-04-14: 2 g via INTRAVENOUS
  Filled 2021-04-14: qty 50

## 2021-04-14 MED ORDER — FOLIC ACID 1 MG PO TABS
1.0000 mg | ORAL_TABLET | Freq: Every day | ORAL | Status: DC
  Administered 2021-04-15 – 2021-04-19 (×5): 1 mg via ORAL
  Filled 2021-04-14 (×5): qty 1

## 2021-04-14 MED ORDER — OXYCODONE HCL 5 MG PO TABS
5.0000 mg | ORAL_TABLET | ORAL | Status: DC | PRN
  Administered 2021-04-14 – 2021-04-19 (×9): 5 mg via ORAL
  Filled 2021-04-14 (×9): qty 1

## 2021-04-14 MED ORDER — ACETAMINOPHEN 325 MG PO TABS
650.0000 mg | ORAL_TABLET | Freq: Four times a day (QID) | ORAL | Status: DC | PRN
  Administered 2021-04-17: 650 mg via ORAL
  Filled 2021-04-14: qty 2

## 2021-04-14 MED ORDER — LORAZEPAM 2 MG/ML IJ SOLN
1.0000 mg | Freq: Once | INTRAMUSCULAR | Status: AC
  Administered 2021-04-14: 1 mg via INTRAVENOUS
  Filled 2021-04-14: qty 1

## 2021-04-14 MED ORDER — LORAZEPAM 2 MG/ML IJ SOLN: 0.0000 mg | Freq: Two times a day (BID) | INTRAMUSCULAR | Status: DC

## 2021-04-14 MED ORDER — LORAZEPAM 1 MG PO TABS: 0.0000 mg | ORAL_TABLET | Freq: Two times a day (BID) | ORAL | Status: DC

## 2021-04-14 MED ORDER — SENNOSIDES-DOCUSATE SODIUM 8.6-50 MG PO TABS
1.0000 | ORAL_TABLET | Freq: Every evening | ORAL | Status: DC | PRN
  Administered 2021-04-17: 1 via ORAL

## 2021-04-14 MED ORDER — LORAZEPAM 1 MG PO TABS: 0.0000 mg | ORAL_TABLET | Freq: Four times a day (QID) | ORAL | Status: DC

## 2021-04-14 MED ORDER — DEXMEDETOMIDINE HCL IN NACL 400 MCG/100ML IV SOLN
0.2000 ug/kg/h | INTRAVENOUS | Status: DC
  Administered 2021-04-14: 0.2 ug/kg/h via INTRAVENOUS
  Administered 2021-04-15: 0.7 ug/kg/h via INTRAVENOUS
  Filled 2021-04-14 (×4): qty 100

## 2021-04-14 MED ORDER — SODIUM CHLORIDE 0.9% FLUSH
3.0000 mL | Freq: Two times a day (BID) | INTRAVENOUS | Status: DC
  Administered 2021-04-14 – 2021-04-19 (×10): 3 mL via INTRAVENOUS

## 2021-04-14 MED ORDER — TRAZODONE HCL 50 MG PO TABS
25.0000 mg | ORAL_TABLET | Freq: Every evening | ORAL | Status: DC | PRN
  Administered 2021-04-14 – 2021-04-18 (×4): 25 mg via ORAL
  Filled 2021-04-14 (×4): qty 1

## 2021-04-14 MED ORDER — HYDRALAZINE HCL 20 MG/ML IJ SOLN
10.0000 mg | INTRAMUSCULAR | Status: DC | PRN
  Administered 2021-04-14 – 2021-04-17 (×2): 10 mg via INTRAVENOUS
  Filled 2021-04-14 (×2): qty 1

## 2021-04-14 MED ORDER — POTASSIUM CHLORIDE CRYS ER 20 MEQ PO TBCR
40.0000 meq | EXTENDED_RELEASE_TABLET | Freq: Once | ORAL | Status: AC
  Administered 2021-04-14: 40 meq via ORAL
  Filled 2021-04-14: qty 2

## 2021-04-14 MED ORDER — AMLODIPINE BESYLATE 5 MG PO TABS
10.0000 mg | ORAL_TABLET | Freq: Every day | ORAL | Status: DC
  Administered 2021-04-15: 10 mg via ORAL
  Filled 2021-04-14 (×2): qty 2

## 2021-04-14 MED ORDER — IRBESARTAN 150 MG PO TABS
300.0000 mg | ORAL_TABLET | Freq: Every day | ORAL | Status: DC
  Administered 2021-04-15: 300 mg via ORAL
  Filled 2021-04-14: qty 2

## 2021-04-14 MED ORDER — ASPIRIN-CAFFEINE 845-65 MG PO PACK: 1.0000 | PACK | Freq: Every day | ORAL | Status: DC | PRN

## 2021-04-14 MED ORDER — SODIUM CHLORIDE 0.9% FLUSH: 3.0000 mL | INTRAVENOUS | Status: DC | PRN

## 2021-04-14 MED ORDER — THIAMINE HCL 100 MG PO TABS
100.0000 mg | ORAL_TABLET | Freq: Every day | ORAL | Status: DC
  Administered 2021-04-15 – 2021-04-19 (×5): 100 mg via ORAL
  Filled 2021-04-14 (×5): qty 1

## 2021-04-14 MED ORDER — HEPARIN SODIUM (PORCINE) 5000 UNIT/ML IJ SOLN
5000.0000 [IU] | Freq: Three times a day (TID) | INTRAMUSCULAR | Status: DC
  Administered 2021-04-14 – 2021-04-19 (×15): 5000 [IU] via SUBCUTANEOUS
  Filled 2021-04-14 (×15): qty 1

## 2021-04-14 MED ORDER — HYDROMORPHONE HCL 1 MG/ML IJ SOLN
0.5000 mg | INTRAMUSCULAR | Status: DC | PRN
  Administered 2021-04-14: 0.5 mg via INTRAVENOUS
  Administered 2021-04-14 – 2021-04-16 (×8): 1 mg via INTRAVENOUS
  Filled 2021-04-14 (×10): qty 1

## 2021-04-14 MED ORDER — AMLODIPINE-OLMESARTAN 10-40 MG PO TABS: 1.0000 | ORAL_TABLET | Freq: Every day | ORAL | Status: DC

## 2021-04-14 MED ORDER — THIAMINE HCL 100 MG/ML IJ SOLN
100.0000 mg | Freq: Every day | INTRAMUSCULAR | Status: DC
  Administered 2021-04-14: 100 mg via INTRAVENOUS
  Filled 2021-04-14: qty 2

## 2021-04-14 MED ORDER — CHLORHEXIDINE GLUCONATE CLOTH 2 % EX PADS
6.0000 | MEDICATED_PAD | Freq: Every day | CUTANEOUS | Status: DC
  Administered 2021-04-15 – 2021-04-19 (×5): 6 via TOPICAL

## 2021-04-14 MED ORDER — ONDANSETRON HCL 4 MG PO TABS: 4.0000 mg | ORAL_TABLET | Freq: Four times a day (QID) | ORAL | Status: DC | PRN

## 2021-04-14 NOTE — H&P (Signed)
History and Physical   Patient: Stacy Dalton                            PCP: [name], FNP                    DOB: 15-Sep-1950            DOA: 04/14/2021 HCW:[id number]             DOS: 04/14/2021, 11:26 AM  [name], FNP  Patient coming from:   HOME  I have personally reviewed patient's medical records, in electronic medical records, including:  [place] [facility], and care everywhere.    Chief Complaint:   Chief Complaint  Patient presents with   generalized pain    History of present illness:    Stacy Dalton is a 70 y.o. female with medical history significant of COPD, HTN, HLD, hypothyroidism, migraine headaches, chronic pain syndrome, alcohol abuse history of SBO, with colostomy and takedown, B12 deficiency... Presenting to the ED with significant agitation, tremors, potation admitted to chronic alcohol use.  Admitted to drinking the night before,   Stating she is hurting everywhere, unable to eat or drink much. Admits that she has been drinking now over a year now approximately 1 bottle of vodka a day. She lives alone and she does not remember how much exactly she ate yesterday, recent evaluation for her symptoms 10/7 to [facility] ED.  Patient Denies having: Fever, Chills, Cough, SOB, Chest Pain, N/V/D, headache, dizziness, lightheadedness,  Dysuria, Joint pain, rash, open wounds  ED Course:   Vitals:   04/14/21 1047 04/14/21 1100  BP: (!) 167/72 (!) 142/65  Pulse: (!) 131 (!) 114  Resp: (!) 24 (!) 23  Temp:    SpO2: 96% 95%   Abnormal labs; hemoglobin 10.2, potassium 2.8, creatinine 1.03, calcium 7.4, glucose 119, magnesium 1.1     Review of Systems: As per HPI, otherwise 10 point review of systems were negative.   ----------------------------------------------------------------------------------------------------------------------  Allergies  Allergen Reactions   Librium [Chlordiazepoxide]     Became unresponsive   Toradol  [Ketorolac Tromethamine] Shortness Of Breath   Butalbital-Apap-Caffeine Other (See Comments)    jittery   Demerol Swelling   Esgic [Butalbital-Apap-Caffeine] Other (See Comments)    jittery   Iodine Other (See Comments)    bp bottomed out per pt several hours later; unsure if pre medicated in past with cm; [facility]    Home MEDs:  Prior to Admission medications   Medication Sig Start Date End Date Taking? Authorizing Provider  amLODipine-olmesartan (AZOR) 10-40 MG tablet Take 1 tablet by mouth daily. 11/30/20  Yes Hawks, Christy A, FNP  Aspirin-Caffeine (BC FAST PAIN RELIEF) 845-65 MG PACK Take 1 packet by mouth daily as needed (pain or headache).   Yes [provider]  DULoxetine (CYMBALTA) 60 MG capsule Take 1 capsule (60 mg total) by mouth daily. 04/27/20  Yes Hawks, Christy A, FNP  levothyroxine (SYNTHROID) 50 MCG tablet Take 1 tablet (50 mcg total) by mouth daily before breakfast. 06/22/20  Yes Hawks, Christy A, FNP  ondansetron (ZOFRAN) 4 MG tablet Take 1 tablet (4 mg total) by mouth every 8 (eight) hours as needed for nausea or vomiting. 03/24/21  Yes Hawks, Christy A, FNP  Vitamin D, Ergocalciferol, (DRISDOL) 1.25 MG (50000 UNIT) CAPS capsule Take 1 capsule (50,000 Units total) by mouth every 7 (seven) days.  06/22/20  Yes Hawks, Christy A, FNP  amoxicillin-clavulanate (AUGMENTIN) 875-125 MG tablet Take 1 tablet by mouth 2 (two) times daily. Patient not taking: Reported on 04/14/2021 02/24/21   [name], FNP  chlordiazePOXIDE (LIBRIUM) 10 MG capsule Take 3 tablets by mouth daily X2 days; then 2 tablets by mouth daily X 2 days, then 1 tablet by mouth daily X 3 days and stop librium. Patient not taking: Reported on 04/14/2021 02/07/21   Barton Dubois, MD  cyanocobalamin (,VITAMIN B-12,) 1000 MCG/ML injection Inject 1000 mcg IM daily X 5 days; then weekly for a month and then monthly. Patient not taking: Reported on 04/14/2021 02/07/21   Barton Dubois, MD  fluconazole  (DIFLUCAN) 150 MG tablet Take 1 tablet (150 mg total) by mouth every three (3) days as needed. Patient not taking: Reported on 04/14/2021 03/03/21   Evelina Dun A, FNP  fluticasone furoate-vilanterol (BREO ELLIPTA) 200-25 MCG/INH AEPB Inhale 1 puff into the lungs daily. Patient not taking: Reported on 04/14/2021 01/16/20   [name], FNP  folic acid (FOLVITE) 1 MG tablet Take 1 tablet (1 mg total) by mouth daily. Patient not taking: Reported on 04/14/2021 08/11/20   Wyvonnia Dusky, MD  magnesium oxide (MAG-OX) 400 (241.3 Mg) MG tablet Take 1 tablet (400 mg total) by mouth 2 (two) times daily. Patient not taking: Reported on 04/14/2021 02/07/21   Barton Dubois, MD  Multiple Vitamin (MULTIVITAMIN WITH MINERALS) TABS tablet Take 1 tablet by mouth daily. Patient not taking: Reported on 04/14/2021 02/08/21   Barton Dubois, MD  pantoprazole (PROTONIX) 40 MG tablet Take 1 tablet (40 mg total) by mouth daily. Patient not taking: Reported on 04/14/2021 02/07/21   Barton Dubois, MD  predniSONE (STERAPRED UNI-PAK 21 TAB) 10 MG (21) TBPK tablet Use as directed Patient not taking: Reported on 04/14/2021 02/23/21   Evelina Dun A, FNP  thiamine 100 MG tablet Take 1 tablet (100 mg total) by mouth daily. Patient not taking: Reported on 04/14/2021 10/12/20   Lorella Nimrod, MD    PRN MEDs: sodium chloride, acetaminophen **OR** acetaminophen, Aspirin-Caffeine, bisacodyl, hydrALAZINE, HYDROmorphone (DILAUDID) injection, ipratropium, levalbuterol, ondansetron **OR** ondansetron (ZOFRAN) IV, oxyCODONE, senna-docusate, sodium chloride flush, traZODone  Past Medical History:  Diagnosis Date   Chronic pain    COPD (chronic obstructive pulmonary disease) ([place])    told has copd, no current inhaler use   Cough    Depression    ETOH abuse    GERD (gastroesophageal reflux disease)    Hypertension    Hypothyroidism    Insomnia    Migraine    Migraine    Osteopenia    Pain management    Panic attacks     Small bowel obstruction ([place])     Past Surgical History:  Procedure Laterality Date   ABDOMINAL HYSTERECTOMY     COLON SURGERY     COLOSTOMY CLOSURE  04/19/2012   Procedure: COLOSTOMY CLOSURE;  Surgeon: Adin Hector, MD;  Location: [facility];  Service: General;  Laterality: N/A;  Laparotomy, Resection and Closure of Colostomy   COLOSTOMY TAKEDOWN N/A 04/12/2016   Procedure: [place] TAKEDOWN;  Surgeon: Johnathan Hausen, MD;  Location: [facility];  Service: General;  Laterality: N/A;   INCONTINENCE SURGERY     LAPAROTOMY  10/04/2011, colostomy also   Procedure: EXPLORATORY LAPAROTOMY;  Surgeon: Adin Hector, MD;  Location: [facility];  Service: General;  Laterality: N/A;  left partial colectomy with colostomy   LAPAROTOMY  04/19/2012   Procedure: EXPLORATORY LAPAROTOMY;  Surgeon: Adin Hector, MD;  Location: [facility];  Service: General;  Laterality: N/A;   LAPAROTOMY N/A 11/29/2015   Procedure: EXPLORATORY LAPAROTOMY; SUBTOTAL COLECTOMY WITH HARTMAN PROCEDURE AND END COLOSTOMY;  Surgeon: Johnathan Hausen, MD;  Location: [facility];  Service: General;  Laterality: N/A;   TUBAL LIGATION     VENTRAL HERNIA REPAIR  04/19/2012   Procedure: HERNIA REPAIR VENTRAL ADULT;  Surgeon: Adin Hector, MD;  Location: [facility];  Service: General;  Laterality: N/A;     reports that she quit smoking about 22 years ago. Her smoking use included cigarettes. She has a 30.00 pack-year smoking history. She has never used smokeless tobacco. She reports current alcohol use. She reports that she does not use drugs.   Family History  Problem Relation Age of Onset   Heart disease Mother    Hypertension Mother    Cancer Sister        sinus   Diabetes Brother    Heart disease Brother    Thyroid disease Brother    Cancer Sister        abdominal ?   Thyroid disease Sister    Cancer Other        GE junction adenocarcinoma   Emphysema Father    Stroke Father    Hypertension Father    Heart disease Father    COPD Sister     Thyroid disease Sister    Thyroid disease Sister    Diabetes Brother    Thyroid disease Brother    Thyroid disease Brother    Hypertension Brother    Post-traumatic stress disorder Brother    COPD Brother    Thyroid disease Brother    Thyroid disease Brother    Hypertension Brother    Transient ischemic attack Brother     Physical Exam:   Vitals:   04/14/21 0930 04/14/21 1000 04/14/21 1047 04/14/21 1100  BP: (!) 144/67 140/62 (!) 167/72 (!) 142/65  Pulse: (!) 109 (!) 112 (!) 131 (!) 114  Resp: 14 13 (!) 24 (!) 23  Temp:      TempSrc:      SpO2: 97% 100% 96% 95%  Weight:      Height:       Constitutional: NAD, calm, comfortable-visible tremors Eyes: PERRL, lids and conjunctivae normal ENMT: Mucous membranes are moist. Posterior pharynx clear of any exudate or lesions.Normal dentition.  Neck: normal, supple, no masses, no thyromegaly Respiratory: clear to auscultation bilaterally, no wheezing, no crackles. Normal respiratory effort. No accessory muscle use.  Cardiovascular: Regular rate and rhythm, no murmurs / rubs / gallops. No extremity edema. 2+ pedal pulses. No carotid bruits.  Abdomen: no tenderness, no masses palpated. No hepatosplenomegaly. Bowel sounds positive.  Musculoskeletal: no clubbing / cyanosis. No joint deformity upper and lower extremities. Good ROM, no contractures. Normal muscle tone.  Neurologic: CN II-XII grossly intact. Sensation intact, DTR normal. Strength 5/5 in all 4.  Psychiatric: Normal judgment and insight. Alert and oriented x 3. Normal mood.  Skin: no rashes, lesions, ulcers. No induration Decubitus/ulcers:  Wounds: per nursing documentation         Labs on admission:    I have personally reviewed following labs and imaging studies  CBC: Recent Labs  Lab 04/14/21 0842  WBC 8.2  NEUTROABS 6.6  HGB 10.2*  HCT 30.5*  MCV 80.9  PLT 573   Basic Metabolic Panel: Recent Labs  Lab 04/14/21 0842 04/14/21 0843  NA 136  --    K  2.8*  --   CL 100  --   CO2 19*  --   GLUCOSE 119*  --   BUN 16  --   CREATININE 1.03*  --   CALCIUM 7.4*  --   MG  --  1.1*   GFR: Estimated Creatinine Clearance: 37.3 mL/min (A) (by C-G formula based on SCr of 1.03 mg/dL (H)). Liver Function Tests: Recent Labs  Lab 04/14/21 0842  AST 55*  ALT 25  ALKPHOS 154*  BILITOT 1.3*  PROT 6.1*  ALBUMIN 3.4*   No results for input(s): LIPASE, AMYLASE in the last 168 hours. Recent Labs  Lab 04/14/21 0842  AMMONIA 13   Coagulation Profile: Recent Labs  Lab 04/14/21 0842  INR 1.1   Cardiac Enzymes: Recent Labs  Lab 04/14/21 0842  CKTOTAL 171   BNP (last 3 results) No results for input(s): PROBNP in the last 8760 hours. HbA1C: No results for input(s): HGBA1C in the last 72 hours. CBG: Recent Labs  Lab 04/14/21 0829  GLUCAP 118*   Lipid Profile: No results for input(s): CHOL, HDL, LDLCALC, TRIG, CHOLHDL, LDLDIRECT in the last 72 hours. Thyroid Function Tests: Recent Labs    04/14/21 0843  TSH 2.850   Anemia Panel: No results for input(s): VITAMINB12, FOLATE, FERRITIN, TIBC, IRON, RETICCTPCT in the last 72 hours. Urine analysis:    Component Value Date/Time   COLORURINE YELLOW 02/20/2020 0241   APPEARANCEUR Cloudy (A) 02/23/2021 1608   LABSPEC 1.010 02/20/2020 0241   PHURINE 7.0 02/20/2020 0241   GLUCOSEU Negative 02/23/2021 1608   HGBUR NEGATIVE 02/20/2020 0241   BILIRUBINUR Negative 02/23/2021 1608   KETONESUR NEGATIVE 02/20/2020 0241   PROTEINUR 1+ (A) 02/23/2021 1608   PROTEINUR NEGATIVE 02/20/2020 0241   UROBILINOGEN negative 07/01/2015 1541   UROBILINOGEN 0.2 10/07/2012 1928   NITRITE Negative 02/23/2021 1608   NITRITE NEGATIVE 02/20/2020 0241   LEUKOCYTESUR 1+ (A) 02/23/2021 1608   LEUKOCYTESUR LARGE (A) 02/20/2020 0241     Radiologic Exams on Admission:   CT Head Wo Contrast  Result Date: 04/14/2021 CLINICAL DATA:  Fall 2-3 days ago, confusion.  Pain. EXAM: CT HEAD WITHOUT CONTRAST  TECHNIQUE: Contiguous axial images were obtained from the base of the skull through the vertex without intravenous contrast. COMPARISON:  04/08/2021 FINDINGS: Brain: The brainstem, cerebellum, cerebral peduncles, thalami, basal ganglia, basilar cisterns, and ventricular system appear within normal limits. No intracranial hemorrhage, mass lesion, or acute CVA. Vascular: Unremarkable Skull: Unremarkable Sinuses/Orbits: Unremarkable Other: No supplemental non-categorized findings. IMPRESSION: 1. No significant intracranial abnormality is identified. Electronically Signed   By: Van Clines M.D.   On: 04/14/2021 09:39   CT Cervical Spine Wo Contrast  Result Date: 04/14/2021 CLINICAL DATA:  Possible neck trauma, suspected fall 2-3 days ago EXAM: CT CERVICAL SPINE WITHOUT CONTRAST TECHNIQUE: Multidetector CT imaging of the cervical spine was performed without intravenous contrast. Multiplanar CT image reconstructions were also generated. COMPARISON:  None. FINDINGS: Alignment: 2.5 mm of likely degenerative posterior subluxation at C5-6. 1 mm degenerative retrolisthesis at C6-7. Mild reversal of the normal cervical lordosis at the C5-6 level. Skull base and vertebrae: Mild spurring at the anterior C1-2 articulation. No acute fracture is identified in the cervical spine. Endplate sclerosis and mild endplate irregularity at C5-6 due to degenerative disc disease and degenerative endplate findings. Deformity of the left mandibular condyle may be due to remote trauma, TMJ arthropathy, or dysplasia. Soft tissues and spinal canal: Bilateral common carotid atherosclerotic calcification. Linear calcifications in the left thyroid gland.  Disc levels: Borderline right foraminal impingement at C5-6 due to uncinate spurring. Possible disc bulge at C6-7 without discrete impingement. Upper chest: Mild biapical pleuroparenchymal scarring. Other: No supplemental non-categorized findings. IMPRESSION: 1. 2.5 mm retrolisthesis at  C5-6 is thought to be degenerative in nature particularly in light of the degree of degenerative endplate findings, loss of intervertebral disc height, and spurring. There is some mild reversal of the cervical lordosis in this vicinity. There is also borderline right foraminal narrowing at C5-6 due to uncinate spurring. 2. No fracture is identified. Electronically Signed   By: Van Clines M.D.   On: 04/14/2021 09:48   DG Pelvis Portable  Result Date: 04/14/2021 CLINICAL DATA:  Fall EXAM: PORTABLE PELVIS 1-2 VIEWS COMPARISON:  None. FINDINGS: There is no evidence of pelvic fracture or diastasis. No pelvic bone lesions are seen. IMPRESSION: Negative. Electronically Signed   By: Franchot Gallo M.D.   On: 04/14/2021 08:25   DG Chest Portable 1 View  Result Date: 04/14/2021 CLINICAL DATA:  Fall.  Chest pain EXAM: PORTABLE CHEST 1 VIEW COMPARISON:  04/08/2021 FINDINGS: The heart size and mediastinal contours are within normal limits. Both lungs are clear. The visualized skeletal structures are unremarkable. IMPRESSION: No active disease. Electronically Signed   By: Franchot Gallo M.D.   On: 04/14/2021 08:24    EKG:   Independently reviewed.  Orders placed or performed during the hospital encounter of 04/14/21   ED EKG   ED EKG   EKG 12-Lead   EKG 12-Lead   EKG 12-Lead   EKG 12-Lead   EKG 12-Lead   ----------------------------------------------------------------------------------------------------------------------------    Assessment / Plan:   Principal Problem:   Alcohol withdrawal (HCC) Active Problems:   COPD (chronic obstructive pulmonary disease) (HCC)   Hypertension   HLD (hyperlipidemia)   Depression   Chronic pain syndrome   Hypothyroidism   Anxiety   GERD (gastroesophageal reflux disease)   Alcohol use disorder, severe, dependence (HCC)   Principal Problem:    Alcohol withdrawal ([place]) -With extensive history of alcohol abuse, presenting with tachycardia,  tachypnea, hypertension, tremors --EtOH withdrawal symptoms, with significant electrolyte normalities and hypomagnesemia -Patient will be admitted to stepdown unit for close observation -Continue IV fluid hydration -Patient has been initiated on CIWA protocol COVID continued -Repleting thiamine folate, potassium, magnesium  Hypokalemia, hypomagnesemia -Potassium 2.8, magnesium 1.1, was repleted p.o. 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    Depression -resuming Cymbalta    Chronic pain syndrome -continue as needed narcotics    Hypothyroidism -continue Synthroid    Anxiety -will be on CIWA protocol, Ativan    GERD (gastroesophageal reflux disease) -we will continue PPI    Alcohol use disorder, severe, dependence (HCC) -extensive consulting regarding alcohol cessation     Cultures:  -none  Antimicrobial: -None  Consults called:  None -------------------------------------------------------------------------------------------------------------------------------------------- DVT prophylaxis: SCD/Compression stockings and Heparin SQ Code Status:   Code Status: Full Code   Admission status: Patient will be admitted as Inpatient, with a greater than 2 midnight length of stay. Level of care: Stepdown   Family Communication:  none at bedside  (The above findings and plan of care has been discussed with patient in detail, the patient expressed  understanding and agreement of above plan)  --------------------------------------------------------------------------------------------------------------------------------------------------  Disposition Plan: >3  days Status is: Inpatient  Remains inpatient appropriate because:Hemodynamically unstable, Persistent severe electrolyte disturbances, and Inpatient level of care appropriate due to severity of illness  Dispo: The patient is from: Home              Anticipated d/c is to: Home              Patient currently is not medically stable to d/c.   Difficult to place patient No   -------------------------------------------------------------------------------------------------------------------------------------  Time spent: > than  55  Min.   SIGNED: Deatra James, MD, FHM. [facility],  Pager (Please use [email or web address] to page to text)  If 7PM-7AM, please contact night-coverage [email or web address],  04/14/2021, 11:26 AM

## 2021-04-14 NOTE — ED Triage Notes (Signed)
Pt presents to ED with complaints of generalized pain. Pt c/o chest pain, left knee pain and pain all over her body. Pt hx of alcohol abuse and trying to quit, but went on a binge last night and unable to sleep all night. Pt can't remember how much she had to drink. Pt states both sides of her chest is hurting, unsure of how many days it has been going on, pt states she fell a few days ago and fell. Pt seen in ED recently. Lives alone.

## 2021-04-14 NOTE — ED Notes (Signed)
Son Stacy Dalton call Him [phone]

## 2021-04-14 NOTE — ED Notes (Signed)
Pt ambulated to restroom with moderate assist from RN. Pt had tremors upon walking. Pt unable to get out of bed and stand without nursing assistance.

## 2021-04-14 NOTE — ED Provider Notes (Signed)
[facility] EMERGENCY DEPARTMENT Provider Note   CSN: [id number] Arrival date & time: 04/14/21  0938     History Chief Complaint  Patient presents with   generalized pain    Stacy Dalton is a 70 y.o. female.  Pt presents to the ED today with pain all over.  Pt has a hx of alcohol abuse and went on a binge last night.  She does not know how much she drank, but said she drank " a lot."  Pt does not know if she's fallen.  She said she hurts everywhere.  She has not been eating much food.  She was seen in the ED at [facility] on 10/7 for similar complaints.  Pt lives alone and a friend periodically checks on her. Pt said she is no longer able to walk.  She's been scooting around on her bottom to get around her house.      Past Medical History:  Diagnosis Date   Chronic pain    COPD (chronic obstructive pulmonary disease) ([place])    told has copd, no current inhaler use   Cough    Depression    ETOH abuse    GERD (gastroesophageal reflux disease)    Hypertension    Hypothyroidism    Insomnia    Migraine    Migraine    Osteopenia    Pain management    Panic attacks    Small bowel obstruction [facility])     Patient Active Problem List   Diagnosis Date Noted   Physical deconditioning    Vitamin B12 deficiency 02/04/2021   Leukocytosis 11/23/2020   Alcohol use disorder, severe, dependence ([place]) 10/08/2020   Hyponatremia 09/17/2020   Alcohol withdrawal (HCC) 09/12/2020   Chronic bronchitis ([place]) 08/20/2020   Moderate protein malnutrition (HCC) 08/20/2020   Chronic low back pain 08/19/2020   Elevated SGOT (AST) 08/19/2020   Elevated troponin 08/19/2020   Increased serum lipase level 08/19/2020   Large bowel obstruction (HCC)    Small bowel obstruction ([place]) 07/20/2018   Hypomagnesemia 07/20/2018   Cough 03/14/2018   Polypharmacy 02/23/2017   Hypokalemia 18/29/9371   Toxic metabolic encephalopathy 69/67/8938   Anxiety 02/22/2017   GERD (gastroesophageal reflux  disease) 02/22/2017   Pain medication agreement signed 10/10/2016   Opioid dependence ([place]) 10/10/2016   Status post colostomy takedown 04/12/2016   Chronic constipation 12/03/2015   Peritonitis with abscess of intestine ([place]) 11/29/2015   COPD exacerbation ([place]) 09/20/2015   Osteopenia 06/10/2014   Vitamin D deficiency 06/10/2014   Hypothyroidism    Depression 11/18/2012   Insomnia 11/18/2012   Chronic pain syndrome 11/18/2012   HLD (hyperlipidemia) 09/01/2012   S/P colostomy ([place]) 04/19/2012   Normocytic anemia 10/15/2011   Hypertension 10/13/2011   Chronic back pain 10/13/2011   COPD (chronic obstructive pulmonary disease) ([place]) 10/09/2011    Past Surgical History:  Procedure Laterality Date   ABDOMINAL HYSTERECTOMY     COLON SURGERY     COLOSTOMY CLOSURE  04/19/2012   Procedure: COLOSTOMY CLOSURE;  Surgeon: Adin Hector, MD;  Location: [facility] ORS;  Service: General;  Laterality: N/A;  Laparotomy, Resection and Closure of Colostomy   COLOSTOMY TAKEDOWN N/A 04/12/2016   Procedure: [place] TAKEDOWN;  Surgeon: Johnathan Hausen, MD;  Location: [facility];  Service: General;  Laterality: N/A;   INCONTINENCE SURGERY     LAPAROTOMY  10/04/2011, colostomy also   Procedure: EXPLORATORY LAPAROTOMY;  Surgeon: Adin Hector, MD;  Location: [facility];  Service: General;  Laterality:  N/A;  left partial colectomy with colostomy   LAPAROTOMY  04/19/2012   Procedure: EXPLORATORY LAPAROTOMY;  Surgeon: Adin Hector, MD;  Location: [facility];  Service: General;  Laterality: N/A;   LAPAROTOMY N/A 11/29/2015   Procedure: EXPLORATORY LAPAROTOMY; SUBTOTAL COLECTOMY WITH HARTMAN PROCEDURE AND END COLOSTOMY;  Surgeon: Johnathan Hausen, MD;  Location: [facility];  Service: General;  Laterality: N/A;   TUBAL LIGATION     VENTRAL HERNIA REPAIR  04/19/2012   Procedure: HERNIA REPAIR VENTRAL ADULT;  Surgeon: Adin Hector, MD;  Location: [facility];  Service: General;  Laterality: N/A;     OB History     Gravida       Para      Term      Preterm      AB      Living  5      SAB      IAB      Ectopic      Multiple      Live Births              Family History  Problem Relation Age of Onset   Heart disease Mother    Hypertension Mother    Cancer Sister        sinus   Diabetes Brother    Heart disease Brother    Thyroid disease Brother    Cancer Sister        abdominal ?   Thyroid disease Sister    Cancer Other        GE junction adenocarcinoma   Emphysema Father    Stroke Father    Hypertension Father    Heart disease Father    COPD Sister    Thyroid disease Sister    Thyroid disease Sister    Diabetes Brother    Thyroid disease Brother    Thyroid disease Brother    Hypertension Brother    Post-traumatic stress disorder Brother    COPD Brother    Thyroid disease Brother    Thyroid disease Brother    Hypertension Brother    Transient ischemic attack Brother     Social History   Tobacco Use   Smoking status: Former    Packs/day: 1.50    Years: 20.00    Pack years: 30.00    Types: Cigarettes    Quit date: 10/04/1998    Years since quitting: 22.5   Smokeless tobacco: Never  Vaping Use   Vaping Use: Never used  Substance Use Topics   Alcohol use: Yes    Comment: 1bottle vodka -3 days. As of 03/01/21 pt  states she has quit drinking"for several days."   Drug use: No    Home Medications Prior to Admission medications   Medication Sig Start Date End Date Taking? Authorizing Provider  amLODipine-olmesartan (AZOR) 10-40 MG tablet Take 1 tablet by mouth daily. 11/30/20  Yes Hawks, Christy A, FNP  Aspirin-Caffeine (BC FAST PAIN RELIEF) 845-65 MG PACK Take 1 packet by mouth daily as needed (pain or headache).   Yes [provider]  DULoxetine (CYMBALTA) 60 MG capsule Take 1 capsule (60 mg total) by mouth daily. 04/27/20  Yes Hawks, Christy A, FNP  levothyroxine (SYNTHROID) 50 MCG tablet Take 1 tablet (50 mcg total) by mouth daily before breakfast.  06/22/20  Yes Hawks, Christy A, FNP  ondansetron (ZOFRAN) 4 MG tablet Take 1 tablet (4 mg total) by mouth every 8 (eight) hours as needed for nausea or vomiting. 03/24/21  Yes Hawks, Christy A, FNP  Vitamin D, Ergocalciferol, (DRISDOL) 1.25 MG (50000 UNIT) CAPS capsule Take 1 capsule (50,000 Units total) by mouth every 7 (seven) days. 06/22/20  Yes Hawks, Christy A, FNP  amoxicillin-clavulanate (AUGMENTIN) 875-125 MG tablet Take 1 tablet by mouth 2 (two) times daily. Patient not taking: Reported on 04/14/2021 02/24/21   [name], FNP  chlordiazePOXIDE (LIBRIUM) 10 MG capsule Take 3 tablets by mouth daily X2 days; then 2 tablets by mouth daily X 2 days, then 1 tablet by mouth daily X 3 days and stop librium. Patient not taking: Reported on 04/14/2021 02/07/21   Barton Dubois, MD  cyanocobalamin (,VITAMIN B-12,) 1000 MCG/ML injection Inject 1000 mcg IM daily X 5 days; then weekly for a month and then monthly. Patient not taking: Reported on 04/14/2021 02/07/21   Barton Dubois, MD  fluconazole (DIFLUCAN) 150 MG tablet Take 1 tablet (150 mg total) by mouth every three (3) days as needed. Patient not taking: Reported on 04/14/2021 03/03/21   Evelina Dun A, FNP  fluticasone furoate-vilanterol (BREO ELLIPTA) 200-25 MCG/INH AEPB Inhale 1 puff into the lungs daily. Patient not taking: Reported on 04/14/2021 01/16/20   [name], FNP  folic acid (FOLVITE) 1 MG tablet Take 1 tablet (1 mg total) by mouth daily. Patient not taking: Reported on 04/14/2021 08/11/20   Wyvonnia Dusky, MD  magnesium oxide (MAG-OX) 400 (241.3 Mg) MG tablet Take 1 tablet (400 mg total) by mouth 2 (two) times daily. Patient not taking: Reported on 04/14/2021 02/07/21   Barton Dubois, MD  Multiple Vitamin (MULTIVITAMIN WITH MINERALS) TABS tablet Take 1 tablet by mouth daily. Patient not taking: Reported on 04/14/2021 02/08/21   Barton Dubois, MD  pantoprazole (PROTONIX) 40 MG tablet Take 1 tablet (40 mg total) by mouth  daily. Patient not taking: Reported on 04/14/2021 02/07/21   Barton Dubois, MD  predniSONE (STERAPRED UNI-PAK 21 TAB) 10 MG (21) TBPK tablet Use as directed Patient not taking: Reported on 04/14/2021 02/23/21   Evelina Dun A, FNP  thiamine 100 MG tablet Take 1 tablet (100 mg total) by mouth daily. Patient not taking: Reported on 04/14/2021 10/12/20   Lorella Nimrod, MD    Allergies    Librium [chlordiazepoxide], Toradol [ketorolac tromethamine], Butalbital-apap-caffeine, Demerol, Esgic [butalbital-apap-caffeine], and Iodine  Review of Systems   Review of Systems  Musculoskeletal:        Everywhere pain  All other systems reviewed and are negative.  Physical Exam Updated Vital Signs BP (!) 142/65   Pulse (!) 114   Temp 98.4 F (36.9 C) (Oral)   Resp (!) 23   Ht 5\' 1"  (1.549 m)   Wt 49.9 kg   SpO2 95%   BMI 20.78 kg/m   Physical Exam Vitals and nursing note reviewed.  Constitutional:      Appearance: She is underweight. She is ill-appearing.  HENT:     Head: Normocephalic and atraumatic.     Right Ear: External ear normal.     Left Ear: External ear normal.     Nose: Nose normal.     Mouth/Throat:     Mouth: Mucous membranes are dry.  Eyes:     Extraocular Movements: Extraocular movements intact.     Conjunctiva/sclera: Conjunctivae normal.     Pupils: Pupils are equal, round, and reactive to light.  Cardiovascular:     Rate and Rhythm: Regular rhythm. Tachycardia present.     Pulses: Normal pulses.     Heart sounds: Normal heart  sounds.  Pulmonary:     Effort: Pulmonary effort is normal.     Breath sounds: Normal breath sounds.  Abdominal:     General: Abdomen is flat. Bowel sounds are normal.     Palpations: Abdomen is soft.  Musculoskeletal:        General: Normal range of motion.     Cervical back: Normal range of motion and neck supple.  Skin:    General: Skin is warm.     Capillary Refill: Capillary refill takes less than 2 seconds.  Neurological:      General: No focal deficit present.     Mental Status: She is alert. She is disoriented.     Motor: Tremor present.     Comments: Pt knows year and name.  She does not know what day of the week it is or date.  Psychiatric:        Mood and Affect: Mood is anxious.        Behavior: Behavior normal.    ED Results / Procedures / Treatments   Labs (all labs ordered are listed, but only abnormal results are displayed) Labs Reviewed  CBC WITH DIFFERENTIAL/PLATELET - Abnormal; Notable for the following components:      Result Value   RBC 3.77 (*)    Hemoglobin 10.2 (*)    HCT 30.5 (*)    RDW 17.3 (*)    All other components within normal limits  COMPREHENSIVE METABOLIC PANEL - Abnormal; Notable for the following components:   Potassium 2.8 (*)    CO2 19 (*)    Glucose, Bld 119 (*)    Creatinine, Ser 1.03 (*)    Calcium 7.4 (*)    Total Protein 6.1 (*)    Albumin 3.4 (*)    AST 55 (*)    Alkaline Phosphatase 154 (*)    Total Bilirubin 1.3 (*)    GFR, Estimated 58 (*)    Anion gap 17 (*)    All other components within normal limits  ETHANOL - Abnormal; Notable for the following components:   Alcohol, Ethyl (B) 73 (*)    All other components within normal limits  MAGNESIUM - Abnormal; Notable for the following components:   Magnesium 1.1 (*)    All other components within normal limits  CBG MONITORING, ED - Abnormal; Notable for the following components:   Glucose-Capillary 118 (*)    All other components within normal limits  RESP PANEL BY RT-PCR (FLU A&B, COVID) ARPGX2  AMMONIA  PROTIME-INR  CK  TSH  BRAIN NATRIURETIC PEPTIDE  URINALYSIS, ROUTINE W REFLEX MICROSCOPIC  RAPID URINE DRUG SCREEN, HOSP PERFORMED    EKG EKG Interpretation  Date/Time:  Thursday April 14 2021 08:14:58 EDT Ventricular Rate:  109 PR Interval:  114 QRS Duration: 85 QT Interval:  344 QTC Calculation: 464 R Axis:   74 Text Interpretation: Sinus tachycardia Multiform ventricular premature  complexes No significant change since last tracing Confirmed by Isla Pence [phone]) on 04/14/2021 8:16:59 AM  Radiology CT Head Wo Contrast  Result Date: 04/14/2021 CLINICAL DATA:  Fall 2-3 days ago, confusion.  Pain. EXAM: CT HEAD WITHOUT CONTRAST TECHNIQUE: Contiguous axial images were obtained from the base of the skull through the vertex without intravenous contrast. COMPARISON:  04/08/2021 FINDINGS: Brain: The brainstem, cerebellum, cerebral peduncles, thalami, basal ganglia, basilar cisterns, and ventricular system appear within normal limits. No intracranial hemorrhage, mass lesion, or acute CVA. Vascular: Unremarkable Skull: Unremarkable Sinuses/Orbits: Unremarkable Other: No supplemental non-categorized findings. IMPRESSION: 1.  No significant intracranial abnormality is identified. Electronically Signed   By: Van Clines M.D.   On: 04/14/2021 09:39   CT Cervical Spine Wo Contrast  Result Date: 04/14/2021 CLINICAL DATA:  Possible neck trauma, suspected fall 2-3 days ago EXAM: CT CERVICAL SPINE WITHOUT CONTRAST TECHNIQUE: Multidetector CT imaging of the cervical spine was performed without intravenous contrast. Multiplanar CT image reconstructions were also generated. COMPARISON:  None. FINDINGS: Alignment: 2.5 mm of likely degenerative posterior subluxation at C5-6. 1 mm degenerative retrolisthesis at C6-7. Mild reversal of the normal cervical lordosis at the C5-6 level. Skull base and vertebrae: Mild spurring at the anterior C1-2 articulation. No acute fracture is identified in the cervical spine. Endplate sclerosis and mild endplate irregularity at C5-6 due to degenerative disc disease and degenerative endplate findings. Deformity of the left mandibular condyle may be due to remote trauma, TMJ arthropathy, or dysplasia. Soft tissues and spinal canal: Bilateral common carotid atherosclerotic calcification. Linear calcifications in the left thyroid gland. Disc levels: Borderline right  foraminal impingement at C5-6 due to uncinate spurring. Possible disc bulge at C6-7 without discrete impingement. Upper chest: Mild biapical pleuroparenchymal scarring. Other: No supplemental non-categorized findings. IMPRESSION: 1. 2.5 mm retrolisthesis at C5-6 is thought to be degenerative in nature particularly in light of the degree of degenerative endplate findings, loss of intervertebral disc height, and spurring. There is some mild reversal of the cervical lordosis in this vicinity. There is also borderline right foraminal narrowing at C5-6 due to uncinate spurring. 2. No fracture is identified. Electronically Signed   By: Van Clines M.D.   On: 04/14/2021 09:48   DG Pelvis Portable  Result Date: 04/14/2021 CLINICAL DATA:  Fall EXAM: PORTABLE PELVIS 1-2 VIEWS COMPARISON:  None. FINDINGS: There is no evidence of pelvic fracture or diastasis. No pelvic bone lesions are seen. IMPRESSION: Negative. Electronically Signed   By: Franchot Gallo M.D.   On: 04/14/2021 08:25   DG Chest Portable 1 View  Result Date: 04/14/2021 CLINICAL DATA:  Fall.  Chest pain EXAM: PORTABLE CHEST 1 VIEW COMPARISON:  04/08/2021 FINDINGS: The heart size and mediastinal contours are within normal limits. Both lungs are clear. The visualized skeletal structures are unremarkable. IMPRESSION: No active disease. Electronically Signed   By: Franchot Gallo M.D.   On: 04/14/2021 08:24    Procedures Procedures   Medications Ordered in ED Medications  LORazepam (ATIVAN) injection 0-4 mg (2 mg Intravenous Given 04/14/21 1054)    Or  LORazepam (ATIVAN) tablet 0-4 mg ( Oral See Alternative 04/14/21 1054)  LORazepam (ATIVAN) injection 0-4 mg (has no administration in time range)    Or  LORazepam (ATIVAN) tablet 0-4 mg (has no administration in time range)  thiamine tablet 100 mg ( Oral See Alternative 04/14/21 1055)    Or  thiamine (B-1) injection 100 mg (100 mg Intravenous Given 04/14/21 1055)  morphine 4 MG/ML  injection 4 mg (has no administration in time range)  sodium chloride flush (NS) 0.9 % injection 3 mL (has no administration in time range)  0.9 %  sodium chloride infusion (has no administration in time range)  sodium chloride flush (NS) 0.9 % injection 3 mL (has no administration in time range)  sodium chloride flush (NS) 0.9 % injection 3 mL (has no administration in time range)  0.9 %  sodium chloride infusion (has no administration in time range)  acetaminophen (TYLENOL) tablet 650 mg (has no administration in time range)    Or  acetaminophen (TYLENOL) suppository 650 mg (has  no administration in time range)  oxyCODONE (Oxy IR/ROXICODONE) immediate release tablet 5 mg (has no administration in time range)  HYDROmorphone (DILAUDID) injection 0.5-1 mg (has no administration in time range)  traZODone (DESYREL) tablet 25 mg (has no administration in time range)  senna-docusate (Senokot-S) tablet 1 tablet (has no administration in time range)  bisacodyl (DULCOLAX) EC tablet 5 mg (has no administration in time range)  ondansetron (ZOFRAN) tablet 4 mg (has no administration in time range)    Or  ondansetron (ZOFRAN) injection 4 mg (has no administration in time range)  ipratropium (ATROVENT) nebulizer solution 0.5 mg (has no administration in time range)  levalbuterol (XOPENEX) nebulizer solution 0.63 mg (has no administration in time range)  hydrALAZINE (APRESOLINE) injection 10 mg (has no administration in time range)  heparin injection 5,000 Units (has no administration in time range)  levothyroxine (SYNTHROID) tablet 50 mcg (has no administration in time range)  DULoxetine (CYMBALTA) DR capsule 60 mg (has no administration in time range)  Vitamin D (Ergocalciferol) (DRISDOL) capsule 50,000 Units (has no administration in time range)  fluticasone furoate-vilanterol (BREO ELLIPTA) 200-25 MCG/INH 1 puff (1 puff Inhalation Not Given 04/14/21 1131)  amLODipine (NORVASC) tablet 10 mg (has no  administration in time range)  irbesartan (AVAPRO) tablet 300 mg (has no administration in time range)  sodium chloride 0.9 % bolus 1,000 mL (0 mLs Intravenous Stopped 04/14/21 1017)  LORazepam (ATIVAN) injection 1 mg (1 mg Intravenous Given 04/14/21 0830)  morphine 4 MG/ML injection 4 mg (4 mg Intravenous Given 04/14/21 0831)  ondansetron (ZOFRAN) injection 4 mg (4 mg Intravenous Given 04/14/21 0831)  potassium chloride 10 mEq in 100 mL IVPB (10 mEq Intravenous New Bag/Given 04/14/21 1049)  potassium chloride SA (KLOR-CON) CR tablet 40 mEq (40 mEq Oral Given 04/14/21 1057)  magnesium sulfate IVPB 2 g 50 mL (2 g Intravenous New Bag/Given 04/14/21 1050)  sodium chloride 0.9 % bolus 1,000 mL (1,000 mLs Intravenous New Bag/Given 04/14/21 1058)    ED Course  I have reviewed the triage vital signs and the nursing notes.  Pertinent labs & imaging results that were available during my care of the patient were reviewed by me and considered in my medical decision making (see chart for details).    MDM Rules/Calculators/A&P                           Trauma eval neg.    Pt is hypokalemic and hypomagnesemic.  Both are replaced.  Pt remains tachy.  I suspect she is starting to withdrawal.  She is started on the CIWA protocol and given ativan.  Pt is able to ambulate with a lot of assistance and a walker, but is so shaky, she looked like she was going to fall.  Pt d/w Dr. Roger [facility]ter (triad) for admission.  CRITICAL CARE Performed by: Isla Pence   Total critical care time: 30 minutes  Critical care time was exclusive of separately billable procedures and treating other patients.  Critical care was necessary to treat or prevent imminent or life-threatening deterioration.  Critical care was time spent personally by me on the following activities: development of treatment plan with patient and/or surrogate as well as nursing, discussions with consultants, evaluation of patient's response  to treatment, examination of patient, obtaining history from patient or surrogate, ordering and performing treatments and interventions, ordering and review of laboratory studies, ordering and review of radiographic studies, pulse oximetry and re-evaluation of patient's condition.  Final Clinical Impression(s) / ED Diagnoses Final diagnoses:  Alcohol abuse  Hypokalemia  Hypomagnesemia  Alcohol withdrawal syndrome without complication [facility])  Ambulatory dysfunction    Rx / DC Orders ED Discharge Orders     None        Isla Pence, MD 04/14/21 1206

## 2021-04-14 NOTE — Progress Notes (Signed)
Covid positive. No O2 requirement. History of EtOH abuse with slight elevation in LFTs. Avoiding hepatotoxic agents like paxlovid for now.

## 2021-04-15 DIAGNOSIS — K219 Gastro-esophageal reflux disease without esophagitis: Secondary | ICD-10-CM

## 2021-04-15 DIAGNOSIS — I959 Hypotension, unspecified: Secondary | ICD-10-CM | POA: Diagnosis not present

## 2021-04-15 DIAGNOSIS — U071 COVID-19: Secondary | ICD-10-CM | POA: Diagnosis present

## 2021-04-15 LAB — CBC
HCT: 25.5 % — ABNORMAL LOW (ref 36.0–46.0)
Hemoglobin: 8 g/dL — ABNORMAL LOW (ref 12.0–15.0)
MCH: 26.6 pg (ref 26.0–34.0)
MCHC: 31.4 g/dL (ref 30.0–36.0)
MCV: 84.7 fL (ref 80.0–100.0)
Platelets: 153 10*3/uL (ref 150–400)
RBC: 3.01 MIL/uL — ABNORMAL LOW (ref 3.87–5.11)
RDW: 17.2 % — ABNORMAL HIGH (ref 11.5–15.5)
WBC: 4.4 10*3/uL (ref 4.0–10.5)
nRBC: 0 % (ref 0.0–0.2)

## 2021-04-15 LAB — BASIC METABOLIC PANEL
Anion gap: 6 (ref 5–15)
BUN: 9 mg/dL (ref 8–23)
CO2: 24 mmol/L (ref 22–32)
Calcium: 7.1 mg/dL — ABNORMAL LOW (ref 8.9–10.3)
Chloride: 105 mmol/L (ref 98–111)
Creatinine, Ser: 0.72 mg/dL (ref 0.44–1.00)
GFR, Estimated: >60 mL/min (ref >60)
Glucose, Bld: 122 mg/dL — ABNORMAL HIGH (ref 70–99)
Potassium: 3.3 mmol/L — ABNORMAL LOW (ref 3.5–5.1)
Sodium: 135 mmol/L (ref 135–145)

## 2021-04-15 LAB — MAGNESIUM: Magnesium: 1.5 mg/dL — ABNORMAL LOW (ref 1.7–2.4)

## 2021-04-15 LAB — PROTIME-INR
INR: 1.1 (ref 0.8–1.2)
Prothrombin Time: 14.5 seconds (ref 11.4–15.2)

## 2021-04-15 LAB — GLUCOSE, CAPILLARY: Glucose-Capillary: 120 mg/dL — ABNORMAL HIGH (ref 70–99)

## 2021-04-15 MED ORDER — POTASSIUM CHLORIDE CRYS ER 20 MEQ PO TBCR
40.0000 meq | EXTENDED_RELEASE_TABLET | Freq: Once | ORAL | Status: AC
  Administered 2021-04-15: 40 meq via ORAL
  Filled 2021-04-15: qty 2

## 2021-04-15 MED ORDER — ZINC SULFATE 220 (50 ZN) MG PO CAPS
220.0000 mg | ORAL_CAPSULE | Freq: Every day | ORAL | Status: AC
  Administered 2021-04-15 – 2021-04-19 (×5): 220 mg via ORAL
  Filled 2021-04-15 (×5): qty 1

## 2021-04-15 MED ORDER — ASCORBIC ACID 500 MG PO TABS
500.0000 mg | ORAL_TABLET | Freq: Every day | ORAL | Status: AC
  Administered 2021-04-15 – 2021-04-19 (×5): 500 mg via ORAL
  Filled 2021-04-15 (×5): qty 1

## 2021-04-15 MED ORDER — MAGNESIUM SULFATE 2 GM/50ML IV SOLN
2.0000 g | Freq: Once | INTRAVENOUS | Status: AC
  Administered 2021-04-15: 2 g via INTRAVENOUS
  Filled 2021-04-15: qty 50

## 2021-04-15 NOTE — Evaluation (Signed)
Physical Therapy Evaluation Patient Details Name: Stacy Dalton MRN: [id number] DOB: 08/29/1950 Today's Date: 04/15/2021  History of Present Illness  Stacy Dalton is a 70 y.o. female with medical history significant of COPD, HTN, HLD, hypothyroidism, migraine headaches, chronic pain syndrome, alcohol abuse history of SBO, with colostomy and takedown, B12 deficiency... Presenting to the ED with significant agitation, tremors, potation admitted to chronic alcohol use.  Admitted to drinking the night before,    Stating she is hurting everywhere, unable to eat or drink much.  Admits that she has been drinking now over a year now approximately 1 bottle of vodka a day.  She lives alone and she does not remember how much exactly she ate yesterday, recent evaluation for her symptoms 10/7 to [facility] ED.   Clinical Impression  Patient demonstrates slow labored movement for sitting up at bedside, unsteady on feet having to lean on nearby objects for support requiring use of RW for safety and limited to taking a few steps at bedside before requesting to sit due to fatigue and poor standing balance.  Patient tolerated sitting up in chair after therapy - RN notified.  Patient will benefit from continued physical therapy in hospital and recommended venue below to increase strength, balance, endurance for safe ADLs and gait.          Recommendations for follow up therapy are one component of a multi-disciplinary discharge planning process, led by the attending physician.  Recommendations may be updated based on patient status, additional functional criteria and insurance authorization.  Follow Up Recommendations SNF;Supervision for mobility/OOB;Supervision - Intermittent    Equipment Recommendations  None recommended by PT    Recommendations for Other Services       Precautions / Restrictions Precautions Precautions: Fall Restrictions Weight Bearing Restrictions: No      Mobility   Bed Mobility Overal bed mobility: Needs Assistance Bed Mobility: Supine to Sit     Supine to sit: Supervision     General bed mobility comments: increased time, labored movement    Transfers Overall transfer level: Needs assistance Equipment used: Rolling walker (2 wheeled) Transfers: Sit to/from [facility] Sit to Stand: Min guard;Min assist Stand pivot transfers: Min assist       General transfer comment: unsteady slow labored movement  Ambulation/Gait Ambulation/Gait assistance: Mod assist Gait Distance (Feet): 10 Feet Assistive device: Rolling walker (2 wheeled) Gait Pattern/deviations: Decreased step length - right;Decreased step length - left;Decreased stride length Gait velocity: decreased   General Gait Details: has to lean on nearby objects for support requiring use of RW demonstrating slow labored cadence and limited mostly due to fatigue  Stairs            Wheelchair Mobility    Modified Rankin (Stroke Patients Only)       Balance Overall balance assessment: Needs assistance Sitting-balance support: Feet supported;No upper extremity supported Sitting balance-Leahy Scale: Fair Sitting balance - Comments: fair to good seated at EOB   Standing balance support: During functional activity;No upper extremity supported Standing balance-Leahy Scale: Poor Standing balance comment: fair/poor using RW                             Pertinent Vitals/Pain Pain Assessment: Faces Pain Score: 10-Worst pain ever Faces Pain Scale: Hurts little more Pain Location: back, chest, and L knee Pain Descriptors / Indicators: Aching Pain Intervention(s): Limited activity within patient's tolerance;Monitored during session;Repositioned    Home Living  Family/patient expects to be discharged to:: Private residence Living Arrangements: Alone Available Help at Discharge: Family;Available PRN/intermittently Type of Home: House Home Access:  Stairs to enter Entrance Stairs-Rails: None Entrance Stairs-Number of Steps: 1 Home Layout: One level Home Equipment: Walker - 2 wheels;Cane - quad;Bedside commode;Shower seat Additional Comments: pt reports her daughter lives next door    Prior Function Level of Independence: Independent with assistive device(s)         Comments: household and short distanced community ambulator using RW PRN, drives, uses electric cart in stores     Hand Dominance   Dominant Hand: Right    Extremity/Trunk Assessment   Upper Extremity Assessment Upper Extremity Assessment: Defer to OT evaluation    Lower Extremity Assessment Lower Extremity Assessment: Generalized weakness    Cervical / Trunk Assessment Cervical / Trunk Assessment: Normal  Communication   Communication: No difficulties  Cognition Arousal/Alertness: Awake/alert Behavior During Therapy: WFL for tasks assessed/performed Overall Cognitive Status: Within Functional Limits for tasks assessed                                        General Comments      Exercises     Assessment/Plan    PT Assessment Patient needs continued PT services  PT Problem List Decreased strength;Decreased activity tolerance;Decreased balance;Decreased mobility       PT Treatment Interventions DME instruction;Gait training;Stair training;Functional mobility training;Therapeutic activities;Therapeutic exercise;Balance training;Patient/family education    PT Goals (Current goals can be found in the Care Plan section)  Acute Rehab PT Goals Patient Stated Goal: return home with family/friends to assist PT Goal Formulation: With patient Time For Goal Achievement: 04/29/21 Potential to Achieve Goals: Good    Frequency Min 3X/week   Barriers to discharge        Co-evaluation PT/OT/SLP Co-Evaluation/Treatment: Yes Reason for Co-Treatment: To address functional/ADL transfers PT goals addressed during session:  Mobility/safety with mobility;Balance;Proper use of DME OT goals addressed during session: ADL's and self-care       AM-PAC PT "6 Clicks" Mobility  Outcome Measure Help needed turning from your back to your side while in a flat bed without using bedrails?: None Help needed moving from lying on your back to sitting on the side of a flat bed without using bedrails?: A Little Help needed moving to and from a bed to a chair (including a wheelchair)?: A Little Help needed standing up from a chair using your arms (e.g., wheelchair or bedside chair)?: A Little Help needed to walk in hospital room?: A Lot Help needed climbing 3-5 steps with a railing? : A Lot 6 Click Score: 17    End of Session   Activity Tolerance: Patient tolerated treatment well;Patient limited by fatigue Patient left: in chair;with call bell/phone within reach;with chair alarm set Nurse Communication: Mobility status PT Visit Diagnosis: Unsteadiness on feet (R26.81);Other abnormalities of gait and mobility (R26.89);Muscle weakness (generalized) (M62.81)    Time: 0865-7846 PT Time Calculation (min) (ACUTE ONLY): 30 min   Charges:   PT Evaluation $PT Eval Moderate Complexity: 1 Mod PT Treatments $Therapeutic Activity: 23-37 mins        11:01 AM, 04/15/21 Lonell Grandchild, MPT Physical Therapist with [facility] [phone] office [phone] mobile phone

## 2021-04-15 NOTE — Plan of Care (Signed)

## 2021-04-15 NOTE — Progress Notes (Signed)
Progress note  Patient: Stacy Dalton                            PCP: [name], FNP                    DOB: 1951-01-29            DOA: 04/14/2021 [id number]             DOS: 04/15/2021, 12:30 PM  [name], FNP  Patient coming from:   HOME  I have personally reviewed patient's medical records, in electronic medical records, including:  [place] link, and care everywhere.    Objective:   The patient was seen and examined this morning, sitting up in a chair, stating she is not feeling well she is feeling sick.  Denies any nausea or vomiting, complaining of generalized aches and pain and generalized weaknesses. Continues to be on Precedex... Generalized tremors noted SARS-CoV-2 positive, patient reports not vaccinated-does not believe in vaccine In no respiratory distress, satting 90% on room air  History of present illness:    Stacy Dalton is a 70 y.o. female with medical history significant of COPD, HTN, HLD, hypothyroidism, migraine headaches, chronic pain syndrome, alcohol abuse history of SBO, with colostomy and takedown, B12 deficiency... Presenting to the ED with significant agitation, tremors, potation admitted to chronic alcohol use.  Admitted to drinking the night before,   Stating she is hurting everywhere, unable to eat or drink much. Admits that she has been drinking now over a year now approximately 1 bottle of vodka a day. She lives alone and she does not remember how much exactly she ate yesterday, recent evaluation for her symptoms 10/7 to [facility] ED.  Patient Denies having: Fever, Chills, Cough, SOB, Chest Pain, N/V/D, headache, dizziness, lightheadedness,  Dysuria, Joint pain, rash, open wounds  ED Course:   Abnormal labs; hemoglobin 10.2, potassium 2.8, creatinine 1.03, calcium 7.4, glucose 119, magnesium 1.1    Assessment / Plan:   Principal Problem:   Alcohol withdrawal (HCC) Active Problems:   COPD (chronic obstructive  pulmonary disease) (HCC)   Hypertension   HLD (hyperlipidemia)   Depression   Chronic pain syndrome   Hypothyroidism   Anxiety   GERD (gastroesophageal reflux disease)   Alcohol use disorder, severe, dependence (HCC)   Principal Problem:    Alcohol withdrawal ([place]) -With extensive history of alcohol abuse, presenting with tachycardia, tachypnea, hypertension, tremors --EtOH withdrawal symptoms, with significant electrolyte normalities and hypomagnesemia -Patient will be admitted to stepdown unit for close observation -Continue IV fluid hydration -Patient has been initiated on CIWA protocol COVID continued -Repleting thiamine folate, potassium, magnesium  SARS-CoV-2 positive  Not in respiratory stress, satting 98% on room air Denies any fever, shortness of breath, cough or chest pain Complain of generalized aches and pain Not a candidate for [place] admit due to elevated LFTs Continue isolation Patient reports she is not vaccinated  hypokalemia, hypomagnesemia -Potassium 2.8, magnesium 1.1, was repleted p.o. 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    Depression -resuming Cymbalta  Chronic pain syndrome -continue as needed narcotics    Hypothyroidism -continue Synthroid    Anxiety -will be on CIWA protocol, Ativan    GERD (gastroesophageal reflux disease) -we will continue PPI    Alcohol use disorder, severe, dependence ([place]) -extensive consulting regarding alcohol cessation  Severe debility-falls, knee pain -Patient was assessed, difficulty with ambulation, fall likely due to  generalized weaknesses, debility exacerbated by alcohol consumption -No signs of fracture, x-ray of knee negative -PT OT consulted, recommending SNF   Cultures:  -none  Antimicrobial: -None  Consults called:  None -------------------------------------------------------------------------------------------------------------------------------------------- DVT prophylaxis: SCD/Compression stockings and Heparin SQ Code Status:   Code Status: Full Code   Admission status:  Patient will be admitted as Inpatient, with a greater than 2 midnight length of stay. Level of care: ICU   Family Communication:  none at bedside -alert oriented x4 discussed with patient (The above findings and plan of care has been discussed with patient in detail, the patient expressed understanding and agreement of above plan)  --------------------------------------------------------------------------------------------------------------------------------------------------  Disposition Plan: >3 days Status is: Inpatient  Remains inpatient appropriate because:Hemodynamically unstable, Persistent severe electrolyte disturbances, and Inpatient level of care appropriate due to severity of illness  Dispo: The patient is from: Home              Anticipated d/c is to: SNF in 2-3 days              Patient currently is not medically stable to d/c.   Difficult to place patient No  ----------------------------------------------------------------------------------------------------------------------  Allergies  Allergen Reactions   Librium [Chlordiazepoxide]     Became unresponsive   Toradol [Ketorolac Tromethamine] Shortness Of Breath   Butalbital-Apap-Caffeine Other (See Comments)    jittery   Demerol Swelling   Esgic [Butalbital-Apap-Caffeine] Other (See Comments)    jittery   Iodine Other (See Comments)    bp bottomed out per pt several hours later; unsure if pre medicated in past with cm; done in [place]     Home MEDs:  Prior to Admission medications   Medication Sig Start Date End Date Taking? Authorizing Provider  amLODipine-olmesartan (AZOR) 10-40 MG tablet Take 1 tablet by mouth daily. 11/30/20  Yes Hawks, Christy A, FNP  Aspirin-Caffeine (BC FAST PAIN RELIEF) 845-65 MG PACK Take 1 packet by mouth daily as needed (pain or headache).   Yes [provider]  DULoxetine (CYMBALTA) 60 MG capsule Take 1 capsule (60 mg total) by mouth daily. 04/27/20  Yes Hawks, Christy A, FNP  levothyroxine (SYNTHROID) 50 MCG tablet Take 1 tablet (50 mcg total) by mouth daily before breakfast. 06/22/20  Yes Hawks, Christy A, FNP  ondansetron (ZOFRAN) 4 MG tablet Take 1 tablet (4 mg total) by mouth every 8 (eight) hours as needed for nausea or vomiting. 03/24/21  Yes Hawks, Christy A, FNP  Vitamin D, Ergocalciferol, (DRISDOL) 1.25 MG (50000 UNIT) CAPS capsule Take 1 capsule (50,000 Units total) by mouth every 7 (seven) days. 06/22/20  Yes Hawks, Christy A, FNP  amoxicillin-clavulanate (AUGMENTIN) 875-125 MG tablet Take 1 tablet by mouth 2 (two) times daily. Patient not taking: Reported on 04/14/2021 02/24/21   [name], FNP  chlordiazePOXIDE (LIBRIUM) 10 MG capsule Take 3 tablets by mouth daily X2 days; then 2 tablets by mouth daily X 2 days, then 1 tablet by mouth daily X 3 days and stop librium. Patient not taking: Reported on 04/14/2021 02/07/21   Barton Dubois, MD  cyanocobalamin (,VITAMIN B-12,) 1000 MCG/ML injection Inject 1000 mcg IM  daily X 5 days; then weekly for a month and then monthly. Patient not taking: Reported on 04/14/2021 02/07/21   Barton Dubois, MD  fluconazole (DIFLUCAN) 150 MG tablet Take 1 tablet (150 mg total) by mouth every three (3) days as needed. Patient not taking: Reported on 04/14/2021 03/03/21   Evelina Dun A, FNP  fluticasone furoate-vilanterol (BREO ELLIPTA) 200-25 MCG/INH AEPB Inhale 1 puff into the lungs daily. Patient not taking: Reported on 04/14/2021 01/16/20    [name], FNP  folic acid (FOLVITE) 1 MG tablet Take 1 tablet (1 mg total) by mouth daily. Patient not taking: Reported on 04/14/2021 08/11/20   Wyvonnia Dusky, MD  magnesium oxide (MAG-OX) 400 (241.3 Mg) MG tablet Take 1 tablet (400 mg total) by mouth 2 (two) times daily. Patient not taking: Reported on 04/14/2021 02/07/21   Barton Dubois, MD  Multiple Vitamin (MULTIVITAMIN WITH MINERALS) TABS tablet Take 1 tablet by mouth daily. Patient not taking: Reported on 04/14/2021 02/08/21   Barton Dubois, MD  pantoprazole (PROTONIX) 40 MG tablet Take 1 tablet (40 mg total) by mouth daily. Patient not taking: Reported on 04/14/2021 02/07/21   Barton Dubois, MD  predniSONE (STERAPRED UNI-PAK 21 TAB) 10 MG (21) TBPK tablet Use as directed Patient not taking: Reported on 04/14/2021 02/23/21   Evelina Dun A, FNP  thiamine 100 MG tablet Take 1 tablet (100 mg total) by mouth daily. Patient not taking: Reported on 04/14/2021 10/12/20   Lorella Nimrod, MD    PRN MEDs: acetaminophen **OR** acetaminophen, bisacodyl, hydrALAZINE, HYDROmorphone (DILAUDID) injection, ipratropium, levalbuterol, LORazepam, ondansetron **OR** ondansetron (ZOFRAN) IV, oxyCODONE, senna-docusate, traZODone  Past Medical History:  Diagnosis Date   Chronic pain    COPD (chronic obstructive pulmonary disease) ([place])    told has copd, no current inhaler use   Cough    Depression    ETOH abuse    GERD (gastroesophageal reflux disease)    Hypertension    Hypothyroidism    Insomnia    Migraine    Migraine    Osteopenia    Pain management    Panic attacks    Small bowel obstruction ([place])      reports that she quit smoking about 22 years ago. Her smoking use included cigarettes. She has a 30.00 pack-year smoking history. She has never used smokeless tobacco. She reports current alcohol use. She reports that she does not use drugs.   Physical Exam:   Blood pressure (!) 90/52, pulse 74, temperature 98.4 F (36.9 C), resp.  rate (!) 7, height 5\' 1"  (1.549 m), weight 50 kg, SpO2 98 %.   General:  Alert, oriented, cooperative, no distress;   HEENT:  Normocephalic, PERRL, otherwise with in Normal limits   Neuro:  CNII-XII intact. , normal motor and sensation, reflexes intact   Lungs:   Clear to auscultation BL, Respirations unlabored, no wheezes / crackles  Cardio:    S1/S2, RRR, No murmure, No Rubs or Gallops   Abdomen:   Soft, non-tender, bowel sounds active all four quadrants,  no guarding or peritoneal signs.  Muscular skeletal:  Limited exam - in bed, able to move all 4 extremities, Normal strength,  2+ pulses,  symmetric, No pitting edema  Skin:  Dry, warm to touch, negative for any Rashes,  Wounds: Please see nursing documentation      Labs on admission:    I have personally reviewed following labs and imaging studies  CBC: Recent Labs  Lab 04/14/21 0842 04/15/21 0452  WBC  8.2 4.4  NEUTROABS 6.6  --   HGB 10.2* 8.0*  HCT 30.5* 25.5*  MCV 80.9 84.7  PLT 278 782   Basic Metabolic Panel: Recent Labs  Lab 04/14/21 0842 04/14/21 0843 04/15/21 0452  NA 136  --  135  K 2.8*  --  3.3*  CL 100  --  105  CO2 19*  --  24  GLUCOSE 119*  --  122*  BUN 16  --  9  CREATININE 1.03*  --  0.72  CALCIUM 7.4*  --  7.1*  MG  --  1.1* 1.5*   GFR: Estimated Creatinine Clearance: 48 mL/min (by C-G formula based on SCr of 0.72 mg/dL). Liver Function Tests: Recent Labs  Lab 04/14/21 0842  AST 55*  ALT 25  ALKPHOS 154*  BILITOT 1.3*  PROT 6.1*  ALBUMIN 3.4*   No results for input(s): LIPASE, AMYLASE in the last 168 hours. Recent Labs  Lab 04/14/21 0842  AMMONIA 13   Coagulation Profile: Recent Labs  Lab 04/14/21 0842 04/15/21 0452  INR 1.1 1.1   Cardiac Enzymes: Recent Labs  Lab 04/14/21 0842  CKTOTAL 171   BNP (last 3 results) No results for input(s): PROBNP in the last 8760 hours. HbA1C: No results for input(s): HGBA1C in the last 72 hours. CBG: Recent Labs  Lab  04/14/21 0829 04/15/21 0745  GLUCAP 118* 120*   Lipid Profile: No results for input(s): CHOL, HDL, LDLCALC, TRIG, CHOLHDL, LDLDIRECT in the last 72 hours. Thyroid Function Tests: Recent Labs    04/14/21 0843  TSH 2.850   Anemia Panel: No results for input(s): VITAMINB12, FOLATE, FERRITIN, TIBC, IRON, RETICCTPCT in the last 72 hours. Urine analysis:    Component Value Date/Time   COLORURINE YELLOW 04/14/2021 1551   APPEARANCEUR CLOUDY (A) 04/14/2021 1551   APPEARANCEUR Cloudy (A) 02/23/2021 1608   LABSPEC 1.013 04/14/2021 1551   PHURINE 8.0 04/14/2021 1551   GLUCOSEU NEGATIVE 04/14/2021 1551   HGBUR SMALL (A) 04/14/2021 1551   BILIRUBINUR NEGATIVE 04/14/2021 1551   BILIRUBINUR Negative 02/23/2021 1608   KETONESUR 5 (A) 04/14/2021 1551   PROTEINUR 30 (A) 04/14/2021 1551   UROBILINOGEN negative 07/01/2015 1541   UROBILINOGEN 0.2 10/07/2012 1928   NITRITE NEGATIVE 04/14/2021 1551   LEUKOCYTESUR MODERATE (A) 04/14/2021 1551     Radiologic Exams on Admission:   CT Head Wo Contrast  Result Date: 04/14/2021 CLINICAL DATA:  Fall 2-3 days ago, confusion.  Pain. EXAM: CT HEAD WITHOUT CONTRAST TECHNIQUE: Contiguous axial images were obtained from the base of the skull through the vertex without intravenous contrast. COMPARISON:  04/08/2021 FINDINGS: Brain: The brainstem, cerebellum, cerebral peduncles, thalami, basal ganglia, basilar cisterns, and ventricular system appear within normal limits. No intracranial hemorrhage, mass lesion, or acute CVA. Vascular: Unremarkable Skull: Unremarkable Sinuses/Orbits: Unremarkable Other: No supplemental non-categorized findings. IMPRESSION: 1. No significant intracranial abnormality is identified. Electronically Signed   By: Van Clines M.D.   On: 04/14/2021 09:39   CT Cervical Spine Wo Contrast  Result Date: 04/14/2021 CLINICAL DATA:  Possible neck trauma, suspected fall 2-3 days ago EXAM: CT CERVICAL SPINE WITHOUT CONTRAST TECHNIQUE:  Multidetector CT imaging of the cervical spine was performed without intravenous contrast. Multiplanar CT image reconstructions were also generated. COMPARISON:  None. FINDINGS: Alignment: 2.5 mm of likely degenerative posterior subluxation at C5-6. 1 mm degenerative retrolisthesis at C6-7. Mild reversal of the normal cervical lordosis at the C5-6 level. Skull base and vertebrae: Mild spurring at the anterior C1-2 articulation. No acute  fracture is identified in the cervical spine. Endplate sclerosis and mild endplate irregularity at C5-6 due to degenerative disc disease and degenerative endplate findings. Deformity of the left mandibular condyle may be due to remote trauma, TMJ arthropathy, or dysplasia. Soft tissues and spinal canal: Bilateral common carotid atherosclerotic calcification. Linear calcifications in the left thyroid gland. Disc levels: Borderline right foraminal impingement at C5-6 due to uncinate spurring. Possible disc bulge at C6-7 without discrete impingement. Upper chest: Mild biapical pleuroparenchymal scarring. Other: No supplemental non-categorized findings. IMPRESSION: 1. 2.5 mm retrolisthesis at C5-6 is thought to be degenerative in nature particularly in light of the degree of degenerative endplate findings, loss of intervertebral disc height, and spurring. There is some mild reversal of the cervical lordosis in this vicinity. There is also borderline right foraminal narrowing at C5-6 due to uncinate spurring. 2. No fracture is identified. Electronically Signed   By: Van Clines M.D.   On: 04/14/2021 09:48   DG Pelvis Portable  Result Date: 04/14/2021 CLINICAL DATA:  Fall EXAM: PORTABLE PELVIS 1-2 VIEWS COMPARISON:  None. FINDINGS: There is no evidence of pelvic fracture or diastasis. No pelvic bone lesions are seen. IMPRESSION: Negative. Electronically Signed   By: Franchot Gallo M.D.   On: 04/14/2021 08:25   DG Chest Portable 1 View  Result Date: 04/14/2021 CLINICAL  DATA:  Fall.  Chest pain EXAM: PORTABLE CHEST 1 VIEW COMPARISON:  04/08/2021 FINDINGS: The heart size and mediastinal contours are within normal limits. Both lungs are clear. The visualized skeletal structures are unremarkable. IMPRESSION: No active disease. Electronically Signed   By: Franchot Gallo M.D.   On: 04/14/2021 08:24   DG Knee Complete 4 Views Left  Result Date: 04/14/2021 CLINICAL DATA:  Fall.  Left knee pain EXAM: LEFT KNEE - COMPLETE 4+ VIEW COMPARISON:  None. FINDINGS: No evidence of fracture, dislocation, or joint effusion. No evidence of arthropathy or other focal bone abnormality. Soft tissues are unremarkable. IMPRESSION: Negative. Electronically Signed   By: Franchot Gallo M.D.   On: 04/14/2021 14:52    EKG:   Independently reviewed.  Orders placed or performed during the hospital encounter of 04/14/21   ED EKG   ED EKG   EKG 12-Lead   EKG 12-Lead   EKG 12-Lead   EKG 12-Lead   EKG 12-Lead   EKG   ----------------------------------------------------------------------------------------------------------------------------  -------------------------------------------------------------------------------------------------------------------------------------  Time spent: > than  66 Min.  Of critical time was spent seeing stabilizing the patient discussing plan of care patient and ICU staff.  SIGNED: Deatra James, MD, FHM. [facility],  Pager (Please use [email or web address] to page to text)  If 7PM-7AM, please contact night-coverage [email or web address],  04/15/2021, 12:30 PM

## 2021-04-15 NOTE — Evaluation (Signed)
Occupational Therapy Evaluation Patient Details Name: Stacy Dalton MRN: [id number] DOB: 20-Aug-1950 Today's Date: 04/15/2021   History of Present Illness Stacy Dalton is a 70 y.o. female with medical history significant of COPD, HTN, HLD, hypothyroidism, migraine headaches, chronic pain syndrome, alcohol abuse history of SBO, with colostomy and takedown, B12 deficiency... Presenting to the ED with significant agitation, tremors, potation admitted to chronic alcohol use.  Admitted to drinking the night before,    Stating she is hurting everywhere, unable to eat or drink much.  Admits that she has been drinking now over a year now approximately 1 bottle of vodka a day.  She lives alone and she does not remember how much exactly she ate yesterday, recent evaluation for her symptoms 10/7 to [facility] ED.   Clinical Impression   Pt agreeable to OT/PT co-evaluation. Pt demonstrates bed mobility with supervision assist with mild labored movement. Pt demonstrates B UE generalized weakness and need for Min G to min A for stand pivot transfers and functional ambulation with RW. Pt able to don socks at bed level without physical assist. Pt unsteady when standing without RW with improvement in stability with RW, but still requiring physical guarding or assist. Pt will benefit from continued OT in the hospital and recommended venue below to increase strength, balance, and endurance for safe ADL's.        Recommendations for follow up therapy are one component of a multi-disciplinary discharge planning process, led by the attending physician.  Recommendations may be updated based on patient status, additional functional criteria and insurance authorization.   Follow Up Recommendations  SNF;Supervision - Intermittent;Other (comment) (SNF if assist for mobility cannot be given at home.)    Equipment Recommendations  None recommended by OT           Precautions / Restrictions  Precautions Precautions: Fall Restrictions Weight Bearing Restrictions: No      Mobility Bed Mobility Overal bed mobility: Needs Assistance Bed Mobility: Supine to Sit     Supine to sit: Supervision     General bed mobility comments: mild labored movement    Transfers Overall transfer level: Needs assistance Equipment used: Rolling walker (2 wheeled) Transfers: Sit to/from [facility] Sit to Stand: Min guard;Min assist Stand pivot transfers: Min guard;Min assist       General transfer comment: labored movement; fatigues quickly    Balance Overall balance assessment: Needs assistance Sitting-balance support: No upper extremity supported;Feet supported Sitting balance-Leahy Scale: Fair Sitting balance - Comments: fair to good seated at EOB   Standing balance support: Bilateral upper extremity supported;During functional activity Standing balance-Leahy Scale: Poor Standing balance comment: poor to fair with RW                           ADL either performed or assessed with clinical judgement   ADL Overall ADL's : Needs assistance/impaired                     Lower Body Dressing: Modified independent;Bed level Lower Body Dressing Details (indicate cue type and reason): donning socks at bed level Toilet Transfer: Minimal assistance;RW;Min guard;Stand-pivot Toilet Transfer Details (indicate cue type and reason): using RW simulated via chair transfer to ambulating and back to chair.         Functional mobility during ADLs: Min guard;Rolling walker (slow labored movement)       Vision Baseline Vision/History: 1 Wears glasses (contacts) Ability to  See in Adequate Light: 0 Adequate Patient Visual Report: No change from baseline Vision Assessment?: No apparent visual deficits (per observation)                Pertinent Vitals/Pain Pain Assessment: Faces Pain Score: 10-Worst pain ever Faces Pain Scale: Hurts little more Pain  Location: back, chest, and L knee Pain Descriptors / Indicators: Aching Pain Intervention(s): Limited activity within patient's tolerance;Monitored during session;Repositioned     Hand Dominance Right   Extremity/Trunk Assessment Upper Extremity Assessment Upper Extremity Assessment: Defer to OT evaluation   Lower Extremity Assessment Lower Extremity Assessment: Generalized weakness   Cervical / Trunk Assessment Cervical / Trunk Assessment: Normal   Communication Communication Communication: No difficulties   Cognition Arousal/Alertness: Awake/alert Behavior During Therapy: WFL for tasks assessed/performed Overall Cognitive Status: Within Functional Limits for tasks assessed                                                Home Living Family/patient expects to be discharged to:: Private residence Living Arrangements: Alone Available Help at Discharge: Family;Available PRN/intermittently Type of Home: House Home Access: Stairs to enter [employer] of Steps: 1 Entrance Stairs-Rails: None (grabs post) Home Layout: One level     Bathroom Shower/Tub: Occupational psychologist: Standard Bathroom Accessibility: Yes   Home Equipment: Environmental consultant - 2 wheels;Cane - quad;Bedside commode;Shower seat   Additional Comments: pt reports her daughter lives next door      Prior Functioning/Environment Level of Independence: Independent with assistive device(s)        Comments: Pt reports RW use PRN. Pt drove.        OT Problem List: Decreased strength;Decreased activity tolerance;Impaired balance (sitting and/or standing);Decreased coordination      OT Treatment/Interventions: Self-care/ADL training;Therapeutic exercise;Patient/family education;Balance training;Therapeutic activities;DME and/or AE instruction    OT Goals(Current goals can be found in the care plan section) Acute Rehab OT Goals Patient Stated Goal: return home with  family/friends to assist OT Goal Formulation: With patient Time For Goal Achievement: 04/29/21 Potential to Achieve Goals: Good  OT Frequency: Min 2X/week               Co-evaluation PT/OT/SLP Co-Evaluation/Treatment: Yes Reason for Co-Treatment: To address functional/ADL transfers PT goals addressed during session: Mobility/safety with mobility;Balance;Proper use of DME OT goals addressed during session: ADL's and self-care                       End of Session Equipment Utilized During Treatment: Rolling walker  Activity Tolerance: Patient tolerated treatment well Patient left: in chair;with call bell/phone within reach  OT Visit Diagnosis: Unsteadiness on feet (R26.81);History of falling (Z91.81);Muscle weakness (generalized) (M62.81);Other abnormalities of gait and mobility (R26.89)                Time: 2353-6144 OT Time Calculation (min): 26 min Charges:  OT General Charges $OT Visit: 1 Visit OT Evaluation $OT Eval Low Complexity: 1 Low  Treasure Ingrum OT, MOT  Larey Seat 04/15/2021, 12:09 PM

## 2021-04-15 NOTE — Plan of Care (Signed)
  Problem: Acute Rehab PT Goals(only PT should resolve) Goal: Pt Will Go Supine/Side To Sit Outcome: Progressing Flowsheets (Taken 04/15/2021 1103) Pt will go Supine/Side to Sit:  with modified independence  with supervision Goal: Patient Will Transfer Sit To/From Stand Outcome: Progressing Flowsheets (Taken 04/15/2021 1103) Patient will transfer sit to/from stand: with supervision Goal: Pt Will Transfer Bed To Chair/Chair To Bed Outcome: Progressing Flowsheets (Taken 04/15/2021 1103) Pt will Transfer Bed to Chair/Chair to Bed: with supervision Goal: Pt Will Ambulate Outcome: Progressing Flowsheets (Taken 04/15/2021 1103) Pt will Ambulate:  50 feet  with supervision  with rolling walker   11:04 AM, 04/15/21 Lonell Grandchild, MPT Physical Therapist with [facility] [phone] office [phone] mobile phone

## 2021-04-15 NOTE — TOC Initial Note (Signed)
Transition of Care [facility]) - Initial/Assessment Note    Patient Details  Name: Stacy Dalton MRN: [id number] Date of Birth: July 14, 1950  Transition of [facility]) CM/SW Contact:    Boneta Lucks, RN Phone Number: 04/15/2021, 12:52 PM  Clinical Narrative:      Patient admitted with Alcohol Withdrawal. Patient lives home alone. Has support from her brother. PT is recommending SNF. Patient is agreeable.  First choice is [facility]. TOC sending FL2 out for bed offers.              Expected Discharge Plan: Skilled Nursing Facility Barriers to Discharge: Continued Medical Work up   Patient Goals and CMS Choice Patient states their goals for this hospitalization and ongoing recovery are:: to go to SNF CMS Medicare.gov Compare Post Acute Care list provided to:: Patient Choice offered to / list presented to : Patient  Expected Discharge Plan and Services Expected Discharge Plan: [place]      Living arrangements for the past 2 months: Single Family Home                    Prior Living Arrangements/Services Living arrangements for the past 2 months: Single Family Home Lives with:: Self Patient language and need for interpreter reviewed:: Yes        Need for Family Participation in Patient Care: Yes (Comment) Care giver support system in place?: Yes (comment)   Criminal Activity/Legal Involvement Pertinent to Current Situation/Hospitalization: No - Comment as needed  Activities of Daily Living Home Assistive Devices/Equipment: None ADL Screening (condition at time of admission) Patient's cognitive ability adequate to safely complete daily activities?: Yes Is the patient deaf or have difficulty hearing?: No Does the patient have difficulty seeing, even when wearing glasses/contacts?: Yes (contacts - not in currently) Does the patient have difficulty concentrating, remembering, or making decisions?: No Patient able to express need for assistance with ADLs?: Yes Does  the patient have difficulty dressing or bathing?: No Independently performs ADLs?: Yes (appropriate for developmental age) Does the patient have difficulty walking or climbing stairs?: No Weakness of Legs: None Weakness of Arms/Hands: None  Permission Sought/Granted      Affect (typically observed): Accepting Orientation: : Oriented to Self, Oriented to Place, Oriented to  Time, Oriented to Situation Alcohol / Substance Use: Not Applicable Psych Involvement: No (comment)  Admission diagnosis:  Alcohol withdrawal ([place]) [F10.939] Hypokalemia [E87.6] Hypomagnesemia [E83.42] Alcohol abuse [F10.10] Ambulatory dysfunction [R26.2] Alcohol withdrawal syndrome without complication (HCC) [L93.790] Patient Active Problem List   Diagnosis Date Noted   COVID-19 virus infection 04/15/2021    Class: Acute   Hypotensive episode 04/15/2021    Class: Acute   Physical deconditioning    Vitamin B12 deficiency 02/04/2021   Leukocytosis 11/23/2020   Alcohol use disorder, severe, dependence ([place]) 10/08/2020   Hyponatremia 09/17/2020   Alcohol withdrawal ([place]) 09/12/2020   Chronic bronchitis ([place]) 08/20/2020   Moderate protein malnutrition (HCC) 08/20/2020   Chronic low back pain 08/19/2020   Elevated SGOT (AST) 08/19/2020   Elevated troponin 08/19/2020   Increased serum lipase level 08/19/2020   Large bowel obstruction (HCC)    Small bowel obstruction ([place]) 07/20/2018   Hypomagnesemia 07/20/2018   Cough 03/14/2018   Polypharmacy 02/23/2017   Hypokalemia 24/03/7352   Toxic metabolic encephalopathy 29/92/4268   Anxiety 02/22/2017   GERD (gastroesophageal reflux disease) 02/22/2017   Pain medication agreement signed 10/10/2016   Opioid dependence ([place]) 10/10/2016   Status post colostomy takedown 04/12/2016   Chronic constipation  12/03/2015   Peritonitis with abscess of intestine ([place]) 11/29/2015   COPD exacerbation ([place]) 09/20/2015   Osteopenia 06/10/2014   Vitamin D deficiency 06/10/2014    Hypothyroidism    Depression 11/18/2012   Insomnia 11/18/2012   Chronic pain syndrome 11/18/2012   HLD (hyperlipidemia) 09/01/2012   S/P colostomy ([place]) 04/19/2012   Normocytic anemia 10/15/2011   Hypertension 10/13/2011   Chronic back pain 10/13/2011   COPD (chronic obstructive pulmonary disease) ([place]) 10/09/2011   PCP:  [name], FNP Pharmacy:   [facility] [place], [place] - [address] [place] [address] Phone: [phone] Fax: [phone]   Readmission Risk Interventions Readmission Risk Prevention Plan 04/15/2021  Transportation Screening Complete  Medication Review (RN Care Manager) Complete  HRI or Home Care Consult Complete  SW Recovery Care/Counseling Consult Complete  Palliative Care Screening Not Applicable  Skilled Nursing Facility Complete  Some recent data might be hidden

## 2021-04-15 NOTE — Plan of Care (Signed)
  Problem: Acute Rehab OT Goals (only OT should resolve) Goal: Pt. Will Perform Grooming Flowsheets (Taken 04/15/2021 1212) Pt Will Perform Grooming:  standing  with modified independence  with adaptive equipment Goal: Pt. Will Transfer To Toilet Flowsheets (Taken 04/15/2021 1212) Pt Will Transfer to Toilet:  with modified independence  stand pivot transfer Goal: Pt/Caregiver Will Perform Home Exercise Program Flowsheets (Taken 04/15/2021 1212) Pt/caregiver will Perform Home Exercise Program:  Increased strength  Both right and left upper extremity  With Supervision  Caterina Racine OT, MOT

## 2021-04-15 NOTE — NC FL2 (Signed)
[place] LEVEL OF CARE SCREENING TOOL     IDENTIFICATION  Patient Name: Stacy Dalton Birthdate: 03/30/1951 Sex: female Admission Date (Current Location): 04/14/2021  [facility] and [place] Number:  [employer] and Address:  [facility] [address] [place]      Provider Number: [id number]  Attending Physician Name and Address:  Deatra James, MD  Relative Name and Phone Number:  Earnstine Regal - Brother [phone]    Current Level of Care: Hospital Recommended Level of Care: [place] Prior Approval Number:    Date Approved/Denied:   PASRR Number: Waived  Discharge Plan: SNF    Current Diagnoses: Patient Active Problem List   Diagnosis Date Noted   COVID-19 virus infection 04/15/2021   Hypotensive episode 04/15/2021   Physical deconditioning    Vitamin B12 deficiency 02/04/2021   Leukocytosis 11/23/2020   Alcohol use disorder, severe, dependence ([place]) 10/08/2020   Hyponatremia 09/17/2020   Alcohol withdrawal ([place]) 09/12/2020   Chronic bronchitis ([place]) 08/20/2020   Moderate protein malnutrition ([place]) 08/20/2020   Chronic low back pain 08/19/2020   Elevated SGOT ([facility]) 08/19/2020   Elevated troponin 08/19/2020   Increased serum lipase level 08/19/2020   Large bowel obstruction (HCC)    Small bowel obstruction ([place]) 07/20/2018   Hypomagnesemia 07/20/2018   Cough 03/14/2018   Polypharmacy 02/23/2017   Hypokalemia 24/26/8341   Toxic metabolic encephalopathy 96/22/2979   Anxiety 02/22/2017   GERD (gastroesophageal reflux disease) 02/22/2017   Pain medication agreement signed 10/10/2016   Opioid dependence ([place]) 10/10/2016   Status post colostomy takedown 04/12/2016   Chronic constipation 12/03/2015   Peritonitis with abscess of intestine ([place]) 11/29/2015   COPD exacerbation ([place]) 09/20/2015   Osteopenia 06/10/2014   Vitamin D deficiency 06/10/2014   Hypothyroidism    Depression  11/18/2012   Insomnia 11/18/2012   Chronic pain syndrome 11/18/2012   HLD (hyperlipidemia) 09/01/2012   S/P colostomy ([place]) 04/19/2012   Normocytic anemia 10/15/2011   Hypertension 10/13/2011   Chronic back pain 10/13/2011   COPD (chronic obstructive pulmonary disease) ([place]) 10/09/2011    Orientation RESPIRATION BLADDER Height & Weight     Self, Time, Situation, Place  Normal External catheter Weight: 50 kg Height:  5\' 1"  (154.9 cm)  BEHAVIORAL SYMPTOMS/MOOD NEUROLOGICAL BOWEL NUTRITION STATUS      Continent Diet (See DC summary)  AMBULATORY STATUS COMMUNICATION OF NEEDS Skin   Extensive Assist Verbally Bruising (Generalized)                       Personal Care Assistance Level of Assistance  Bathing, Feeding, Dressing Bathing Assistance: Maximum assistance Feeding assistance: Independent Dressing Assistance: Limited assistance     Functional Limitations Info  Sight, Hearing, Speech Sight Info: Adequate Hearing Info: Adequate Speech Info: Adequate    SPECIAL CARE FACTORS FREQUENCY  PT (By licensed PT)     PT Frequency: 5 times a week              Contractures Contractures Info: Not present    Additional Factors Info  Code Status, Allergies Code Status Info: Full Allergies Info: Librium,toradol,tromethamine,butalbital,demerol,esgic,lodine           Current Medications (04/15/2021):  This is the current hospital active medication list Current Facility-Administered Medications  Medication Dose Route Frequency Provider Last Rate Last Admin   acetaminophen (TYLENOL) tablet 650 mg  650 mg Oral Q6H PRN Deatra James, MD  Or   acetaminophen (TYLENOL) suppository 650 mg  650 mg Rectal Q6H PRN Shahmehdi, Seyed A, MD       ascorbic acid (VITAMIN C) tablet 500 mg  500 mg Oral Daily Shahmehdi, Seyed A, MD   500 mg at 04/15/21 0834   bisacodyl (DULCOLAX) EC tablet 5 mg  5 mg Oral Daily PRN Shahmehdi, Erling Conte A, MD       Chlorhexidine Gluconate Cloth 2 %  PADS 6 each  6 each Topical Q0600 Shahmehdi, Seyed A, MD   6 each at 04/15/21 0603   dexmedetomidine (PRECEDEX) 400 MCG/100ML (4 mcg/mL) infusion  0.2-0.7 mcg/kg/hr Intravenous Titrated Shahmehdi, Seyed A, MD 8.75 mL/hr at 04/15/21 1102 0.7 mcg/kg/hr at 04/15/21 1102   DULoxetine (CYMBALTA) DR capsule 60 mg  60 mg Oral Daily Shahmehdi, Seyed A, MD   60 mg at 04/15/21 0834   fluticasone furoate-vilanterol (BREO ELLIPTA) 200-25 MCG/INH 1 puff  1 puff Inhalation Daily Shahmehdi, Seyed A, MD   1 puff at 84/69/62 9528   folic acid (FOLVITE) tablet 1 mg  1 mg Oral Daily Shahmehdi, Seyed A, MD   1 mg at 04/15/21 0834   heparin injection 5,000 Units  5,000 Units Subcutaneous Q8H Shahmehdi, Seyed A, MD   5,000 Units at 04/15/21 0602   hydrALAZINE (APRESOLINE) injection 10 mg  10 mg Intravenous Q4H PRN Shahmehdi, Seyed A, MD   10 mg at 04/14/21 1903   HYDROmorphone (DILAUDID) injection 0.5-1 mg  0.5-1 mg Intravenous Q2H PRN Shahmehdi, Seyed A, MD   1 mg at 04/15/21 0826   ipratropium (ATROVENT) nebulizer solution 0.5 mg  0.5 mg Nebulization Q6H PRN Shahmehdi, Seyed A, MD       levalbuterol (XOPENEX) nebulizer solution 0.63 mg  0.63 mg Nebulization Q6H PRN Shahmehdi, Seyed A, MD       levothyroxine (SYNTHROID) tablet 50 mcg  50 mcg Oral Q0600 Skipper Cliche A, MD   50 mcg at 04/15/21 0603   LORazepam (ATIVAN) injection 1-2 mg  1-2 mg Intravenous Q1H PRN Shahmehdi, Seyed A, MD   2 mg at 04/15/21 1121   ondansetron (ZOFRAN) tablet 4 mg  4 mg Oral Q6H PRN Shahmehdi, Seyed A, MD       Or   ondansetron (ZOFRAN) injection 4 mg  4 mg Intravenous Q6H PRN Shahmehdi, Seyed A, MD   4 mg at 04/15/21 0825   oxyCODONE (Oxy IR/ROXICODONE) immediate release tablet 5 mg  5 mg Oral Q4H PRN Shahmehdi, Seyed A, MD   5 mg at 04/15/21 4132   senna-docusate (Senokot-S) tablet 1 tablet  1 tablet Oral QHS PRN Shahmehdi, Seyed A, MD       sodium chloride flush (NS) 0.9 % injection 3 mL  3 mL Intravenous Q12H Shahmehdi, Seyed A, MD    3 mL at 04/15/21 0837   thiamine tablet 100 mg  100 mg Oral Daily Isla Pence, MD   100 mg at 04/15/21 4401   Or   thiamine (B-1) injection 100 mg  100 mg Intravenous Daily Isla Pence, MD   100 mg at 04/14/21 1055   traZODone (DESYREL) tablet 25 mg  25 mg Oral QHS PRN Shahmehdi, Erling Conte A, MD   25 mg at 04/14/21 2200   [START ON 04/17/2021] Vitamin D (Ergocalciferol) (DRISDOL) capsule 50,000 Units  50,000 Units Oral Q7 days Shahmehdi, Seyed A, MD       zinc sulfate capsule 220 mg  220 mg Oral Daily Shahmehdi, Seyed A, MD   220 mg at  04/15/21 0834     Discharge Medications: Please see discharge summary for a list of discharge medications.  Relevant Imaging Results:  Relevant Lab Results:   Additional Information SS# [id number]  Boneta Lucks, RN

## 2021-04-16 DIAGNOSIS — F321 Major depressive disorder, single episode, moderate: Secondary | ICD-10-CM

## 2021-04-16 DIAGNOSIS — U071 COVID-19: Secondary | ICD-10-CM

## 2021-04-16 LAB — BASIC METABOLIC PANEL
Anion gap: 6 (ref 5–15)
BUN: 9 mg/dL (ref 8–23)
CO2: 24 mmol/L (ref 22–32)
Calcium: 7.7 mg/dL — ABNORMAL LOW (ref 8.9–10.3)
Chloride: 104 mmol/L (ref 98–111)
Creatinine, Ser: 0.76 mg/dL (ref 0.44–1.00)
GFR, Estimated: >60 mL/min (ref >60)
Glucose, Bld: 118 mg/dL — ABNORMAL HIGH (ref 70–99)
Potassium: 3.9 mmol/L (ref 3.5–5.1)
Sodium: 134 mmol/L — ABNORMAL LOW (ref 135–145)

## 2021-04-16 LAB — CBC
HCT: 26.9 % — ABNORMAL LOW (ref 36.0–46.0)
Hemoglobin: 8.4 g/dL — ABNORMAL LOW (ref 12.0–15.0)
MCH: 26.8 pg (ref 26.0–34.0)
MCHC: 31.2 g/dL (ref 30.0–36.0)
MCV: 85.9 fL (ref 80.0–100.0)
Platelets: 131 10*3/uL — ABNORMAL LOW (ref 150–400)
RBC: 3.13 MIL/uL — ABNORMAL LOW (ref 3.87–5.11)
RDW: 16.4 % — ABNORMAL HIGH (ref 11.5–15.5)
WBC: 3.8 10*3/uL — ABNORMAL LOW (ref 4.0–10.5)
nRBC: 0 % (ref 0.0–0.2)

## 2021-04-16 LAB — IRON AND TIBC
Iron: 26 ug/dL — ABNORMAL LOW (ref 28–170)
Saturation Ratios: 8 % — ABNORMAL LOW (ref 10.4–31.8)
TIBC: 326 ug/dL (ref 250–450)
UIBC: 300 ug/dL

## 2021-04-16 LAB — GLUCOSE, CAPILLARY: Glucose-Capillary: 101 mg/dL — ABNORMAL HIGH (ref 70–99)

## 2021-04-16 LAB — PREALBUMIN: Prealbumin: 21.5 mg/dL (ref 18–38)

## 2021-04-16 LAB — MAGNESIUM: Magnesium: 1.8 mg/dL (ref 1.7–2.4)

## 2021-04-16 LAB — TSH: TSH: 4.367 u[IU]/mL (ref 0.350–4.500)

## 2021-04-16 LAB — VITAMIN B12: Vitamin B-12: 349 pg/mL (ref 180–914)

## 2021-04-16 MED ORDER — METOPROLOL TARTRATE 25 MG PO TABS
25.0000 mg | ORAL_TABLET | Freq: Two times a day (BID) | ORAL | Status: DC
  Administered 2021-04-16 – 2021-04-19 (×7): 25 mg via ORAL
  Filled 2021-04-16 (×7): qty 1

## 2021-04-16 MED ORDER — DEXMEDETOMIDINE HCL IN NACL 400 MCG/100ML IV SOLN
0.2000 ug/kg/h | INTRAVENOUS | Status: DC
  Administered 2021-04-16 – 2021-04-17 (×2): 0.4 ug/kg/h via INTRAVENOUS
  Filled 2021-04-16: qty 100

## 2021-04-16 MED ORDER — DOCUSATE SODIUM 100 MG PO CAPS
100.0000 mg | ORAL_CAPSULE | Freq: Every day | ORAL | Status: DC
  Administered 2021-04-16 – 2021-04-19 (×4): 100 mg via ORAL
  Filled 2021-04-16 (×4): qty 1

## 2021-04-16 MED ORDER — CLONAZEPAM 0.5 MG PO TABS
1.0000 mg | ORAL_TABLET | Freq: Three times a day (TID) | ORAL | Status: DC | PRN
  Administered 2021-04-16 – 2021-04-18 (×6): 1 mg via ORAL
  Filled 2021-04-16 (×6): qty 2

## 2021-04-16 MED ORDER — CALCIUM GLUCONATE-NACL 1-0.675 GM/50ML-% IV SOLN
1.0000 g | Freq: Once | INTRAVENOUS | Status: AC
  Administered 2021-04-16: 1000 mg via INTRAVENOUS
  Filled 2021-04-16: qty 50

## 2021-04-16 MED ORDER — FERROUS SULFATE 325 (65 FE) MG PO TABS: 325.0000 mg | ORAL_TABLET | Freq: Every day | ORAL | Status: DC

## 2021-04-16 NOTE — Progress Notes (Signed)
Pt is becoming increasingly more confused and asking if her family members are outside the room. Pt put into wheelchair and wheeled around the unit and sat at the nurses desk for a bit to help re-orient patient that she is in a hospital.  PRN meds also given, will continue to monitor and re-assess as needed.

## 2021-04-16 NOTE — Progress Notes (Signed)
Progress note  Patient: Stacy Dalton                            PCP: [name], FNP                    DOB: 12-19-1950            DOA: 04/14/2021 [id number]             DOS: 04/16/2021, 11:20 AM  [name], FNP  Patient coming from:   HOME  I have personally reviewed patient's medical records, in electronic medical records, including:  [place] link, and care everywhere.    Objective:   The patient was seen and examined this morning, much more awake alert, following command, comfortable in bed.  Improved tremors.    SARS-CoV-2 positive, patient reports not vaccinated-does not believe in vaccine In no respiratory distress, satting 90% on room air  Still on Precedex drip... Weaning off  History of present illness:    Stacy Dalton is a 70 y.o. female with medical history significant of COPD, HTN, HLD, hypothyroidism, migraine headaches, chronic pain syndrome, alcohol abuse history of SBO, with colostomy and takedown, B12 deficiency... Presenting to the ED with significant agitation, tremors, potation admitted to chronic alcohol use.  Admitted to drinking the night before,   Stating she is hurting everywhere, unable to eat or drink much. Admits that she has been drinking now over a year now approximately 1 bottle of vodka a day. She lives alone and she does not remember how much exactly she ate yesterday, recent evaluation for her symptoms 10/7 to [facility] ED.  Patient Denies having: Fever, Chills, Cough, SOB, Chest Pain, N/V/D, headache, dizziness, lightheadedness,  Dysuria, Joint pain, rash, open wounds  ED Course:   Abnormal labs; hemoglobin 10.2, potassium 2.8, creatinine 1.03, calcium 7.4, glucose 119, magnesium 1.1    Assessment / Plan:   Principal Problem:   Alcohol withdrawal (HCC) Active Problems:   COPD (chronic obstructive pulmonary disease) (HCC)   Hypertension   HLD (hyperlipidemia)   Depression   Chronic pain syndrome    Hypothyroidism   Anxiety   GERD (gastroesophageal reflux disease)   Alcohol use disorder, severe, dependence (HCC)   Principal Problem:    Alcohol withdrawal ([place]) -Much improved symptoms, hemodynamically stable, improved tremors  -With extensive history of alcohol abuse, presented with tachycardia, tachypnea, hypertension, tremors --EtOH withdrawal symptoms, with significant electrolyte normalities and hypomagnesemia -Patient will be admitted to stepdown unit for close observation -Continue IV fluid hydration -Patient has been initiated on CIWA protocol COVID continued -Repleting thiamine folate, potassium, magnesium -Has been on Precedex drip for past 2 days, weaning off, will discontinue today   SARS-CoV-2 positive  In no respiratory distress, satting 95% on room air Denies any fever, shortness of breath, cough or chest pain Complain of generalized aches and pain Not a candidate for [facility] admit due to elevated LFTs Continue isolation Patient reports she is not vaccinated  hypokalemia, hypomagnesemia -Potassium 2.8, magnesium 1.1, was repleted p.o. and IV in ED,  will follow closely and replete accordingly  Active Problems:    COPD (chronic obstructive pulmonary disease)  -currently stable, as needed O2 supplement, and DuoNeb bronchodilators -Stable no distress on room air satting 95%    Hypertension -hypotensive this morning -Discontinue amlodipine -home medication of amlodipine/ Olmesartan -on hold -As needed hydralazine -Adding metoprolol 25 mg p.o. 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    Depression -resuming Cymbalta    Chronic pain syndrome -continue as needed narcotics    Hypothyroidism -continue Synthroid    Anxiety -will be on CIWA protocol, Ativan    GERD (gastroesophageal reflux disease) -we will continue PPI    Alcohol use disorder, severe, dependence ([place]) -extensive consulting regarding  alcohol cessation  Severe debility-falls, knee pain -Patient was assessed, difficulty with ambulation, fall likely due to generalized weaknesses, debility exacerbated by alcohol consumption -No signs of fracture, x-ray of knee negative -PT OT consulted, recommending SNF   Cultures:  -none  Antimicrobial: -None  Consults called:  None -------------------------------------------------------------------------------------------------------------------------------------------- DVT prophylaxis: SCD/Compression stockings and Heparin SQ Code Status:   Code Status: Full Code   Admission status:  Patient will be admitted as Inpatient, with a greater than 2 midnight length of stay. Level of care: ICU   Family Communication:  none at bedside -alert oriented x4 discussed with patient (The above findings and plan of care has been discussed with patient in detail, the patient expressed understanding and agreement of above plan)  --------------------------------------------------------------------------------------------------------------------------------------------------  Disposition Plan: >3 days Status is: Inpatient  Remains inpatient appropriate because:Hemodynamically unstable, Persistent severe electrolyte disturbances, and Inpatient level of care appropriate due to severity of illness  Dispo: The patient is from: Home              Anticipated d/c is to: SNF in 2 days --agreeable to SNF possibly Monday, 04/18/2021              Patient currently is not medically stable to d/c.   Difficult to place patient No  ----------------------------------------------------------------------------------------------------------------------  Allergies  Allergen Reactions   Librium [Chlordiazepoxide]     Became unresponsive   Toradol [Ketorolac Tromethamine] Shortness Of Breath   Butalbital-Apap-Caffeine Other (See Comments)    jittery   Demerol Swelling   Esgic [Butalbital-Apap-Caffeine]  Other (See Comments)    jittery   Iodine Other (See Comments)    bp bottomed out per pt several hours later; unsure if pre medicated in past with cm; done in [place]    vitamin C  500 mg Oral Daily   Chlorhexidine Gluconate Cloth  6 each Topical Q0600   DULoxetine  60 mg Oral Daily   fluticasone furoate-vilanterol  1 puff Inhalation Daily   folic acid  1 mg Oral Daily   heparin  5,000 Units Subcutaneous Q8H   levothyroxine  50 mcg Oral Q0600   metoprolol tartrate  25 mg Oral BID   sodium chloride flush  3 mL Intravenous Q12H   thiamine  100 mg Oral Daily   Or   thiamine  100 mg Intravenous Daily   [START ON 04/17/2021] Vitamin D (Ergocalciferol)  50,000 Units Oral Q7 days   zinc sulfate  220 mg Oral Daily      PRN MEDs: acetaminophen **OR** acetaminophen, bisacodyl, clonazePAM, hydrALAZINE, HYDROmorphone (DILAUDID) injection, ipratropium, levalbuterol, LORazepam, ondansetron **OR** ondansetron (ZOFRAN) IV, oxyCODONE, senna-docusate, traZODone  Past Medical History:  Diagnosis Date   Chronic pain    COPD (chronic obstructive pulmonary disease) ([place])    told has copd, no current inhaler use   Cough    Depression    ETOH abuse    GERD (gastroesophageal reflux disease)    Hypertension    Hypothyroidism    Insomnia    Migraine    Migraine    Osteopenia    Pain management  Panic attacks    Small bowel obstruction ([place])      reports that she quit smoking about 22 years ago. Her smoking use included cigarettes. She has a 30.00 pack-year smoking history. She has never used smokeless tobacco. She reports current alcohol use. She reports that she does not use drugs.   Physical Exam:   Blood pressure (!) 90/52, pulse 74, temperature 98.4 F (36.9 C), resp. rate (!) 7, height 5\' 1"  (1.549 m), weight 50 kg, SpO2 98 %.      Physical Exam:   General:  Alert, oriented, cooperative, no distress;   HEENT:  Normocephalic, PERRL, otherwise with in Normal limits   Neuro:   CNII-XII intact. , normal motor and sensation, reflexes intact   Lungs:   Clear to auscultation BL, Respirations unlabored, no wheezes / crackles  Cardio:    S1/S2, RRR, No murmure, No Rubs or Gallops   Abdomen:   Soft, non-tender, bowel sounds active all four quadrants,  no guarding or peritoneal signs.  Muscular skeletal:  Limited exam - in bed, able to move all 4 extremities, global generalized weakness 2+ pulses,  symmetric, No pitting edema  Skin:  Dry, warm to touch, negative for any Rashes,  Wounds: Please see nursing documentation          Labs on admission:    I have personally reviewed following labs and imaging studies  CBC: Recent Labs  Lab 04/14/21 0842 04/15/21 0452 04/16/21 0349  WBC 8.2 4.4 3.8*  NEUTROABS 6.6  --   --   HGB 10.2* 8.0* 8.4*  HCT 30.5* 25.5* 26.9*  MCV 80.9 84.7 85.9  PLT 278 153 099*   Basic Metabolic Panel: Recent Labs  Lab 04/14/21 0842 04/14/21 0843 04/15/21 0452 04/16/21 0349  NA 136  --  135 134*  K 2.8*  --  3.3* 3.9  CL 100  --  105 104  CO2 19*  --  24 24  GLUCOSE 119*  --  122* 118*  BUN 16  --  9 9  CREATININE 1.03*  --  0.72 0.76  CALCIUM 7.4*  --  7.1* 7.7*  MG  --  1.1* 1.5*  --    GFR: Estimated Creatinine Clearance: 48 mL/min (by C-G formula based on SCr of 0.76 mg/dL). Liver Function Tests: Recent Labs  Lab 04/14/21 0842  AST 55*  ALT 25  ALKPHOS 154*  BILITOT 1.3*  PROT 6.1*  ALBUMIN 3.4*   No results for input(s): LIPASE, AMYLASE in the last 168 hours. Recent Labs  Lab 04/14/21 0842  AMMONIA 13   Coagulation Profile: Recent Labs  Lab 04/14/21 0842 04/15/21 0452  INR 1.1 1.1   Cardiac Enzymes: Recent Labs  Lab 04/14/21 0842  CKTOTAL 171   BNP (last 3 results) No results for input(s): PROBNP in the last 8760 hours. HbA1C: No results for input(s): HGBA1C in the last 72 hours. CBG: Recent Labs  Lab 04/14/21 0829 04/15/21 0745 04/16/21 0812  GLUCAP 118* 120* 101*   Lipid  Profile: No results for input(s): CHOL, HDL, LDLCALC, TRIG, CHOLHDL, LDLDIRECT in the last 72 hours. Thyroid Function Tests: Recent Labs    04/16/21 0741  TSH 4.367   Anemia Panel: Recent Labs    04/16/21 0741  VITAMINB12 349  TIBC 326  IRON 26*   Urine analysis:    Component Value Date/Time   COLORURINE YELLOW 04/14/2021 1551   APPEARANCEUR CLOUDY (A) 04/14/2021 1551   APPEARANCEUR Cloudy (A) 02/23/2021 1608  LABSPEC 1.013 04/14/2021 1551   PHURINE 8.0 04/14/2021 1551   GLUCOSEU NEGATIVE 04/14/2021 1551   HGBUR SMALL (A) 04/14/2021 [place] 04/14/2021 1551   BILIRUBINUR Negative 02/23/2021 1608   KETONESUR 5 (A) 04/14/2021 1551   PROTEINUR 30 (A) 04/14/2021 1551   UROBILINOGEN negative 07/01/2015 1541   UROBILINOGEN 0.2 10/07/2012 1928   NITRITE NEGATIVE 04/14/2021 1551   LEUKOCYTESUR MODERATE (A) 04/14/2021 1551     Radiologic Exams on Admission:   DG Knee Complete 4 Views Left  Result Date: 04/14/2021 CLINICAL DATA:  Fall.  Left knee pain EXAM: LEFT KNEE - COMPLETE 4+ VIEW COMPARISON:  None. FINDINGS: No evidence of fracture, dislocation, or joint effusion. No evidence of arthropathy or other focal bone abnormality. Soft tissues are unremarkable. IMPRESSION: Negative. Electronically Signed   By: Franchot Gallo M.D.   On: 04/14/2021 14:52    EKG:   Independently reviewed.  Orders placed or performed during the hospital encounter of 04/14/21   ED EKG   ED EKG   EKG 12-Lead   EKG 12-Lead   EKG 12-Lead   EKG 12-Lead   EKG 12-Lead   EKG   ----------------------------------------------------------------------------------------------------------------------------  -------------------------------------------------------------------------------------------------------------------------  Time spent: > than  56 Min.  Of critical time was spent seeing stabilizing the patient discussing plan of care patient and ICU staff.  SIGNED: Deatra James, MD, FHM. [facility],  Pager (Please use [email or web address] to page to text)  If 7PM-7AM, please contact night-coverage [email or web address],  04/16/2021, 11:20 AM

## 2021-04-16 NOTE — Progress Notes (Signed)
Attempting to wean Precedex gtt this am, patient is still confused and only alert and oriented to herself. Pt asks multiple times where she is and why she is here. Re-orienting patient every time interaction is made. Offered activities such as crossword puzzles, newspaper, etc. and patient declined. Will continue to monitor and re-orient as necessary.

## 2021-04-16 NOTE — Progress Notes (Addendum)
Pt is still confused and only alert to self. Thinks she is in a condo and is wondering how much one of these will cost to buy. Spoke with patient's son and asked what her baseline is like, and he said she is normally alert and oriented X4, and only thing she may do is occasionally repeat herself. States she has been falling more than usual at home also. Pt is also getting out of bed several times and not following safety commands.   Dr. Wynetta Emery notified, new orders placed. Will pass on to night shift RN.

## 2021-04-17 LAB — CBC
HCT: 27.5 % — ABNORMAL LOW (ref 36.0–46.0)
Hemoglobin: 8.5 g/dL — ABNORMAL LOW (ref 12.0–15.0)
MCH: 26.2 pg (ref 26.0–34.0)
MCHC: 30.9 g/dL (ref 30.0–36.0)
MCV: 84.6 fL (ref 80.0–100.0)
Platelets: 135 10*3/uL — ABNORMAL LOW (ref 150–400)
RBC: 3.25 MIL/uL — ABNORMAL LOW (ref 3.87–5.11)
RDW: 16.1 % — ABNORMAL HIGH (ref 11.5–15.5)
WBC: 4 10*3/uL (ref 4.0–10.5)
nRBC: 0 % (ref 0.0–0.2)

## 2021-04-17 LAB — BASIC METABOLIC PANEL
Anion gap: 5 (ref 5–15)
BUN: 8 mg/dL (ref 8–23)
CO2: 28 mmol/L (ref 22–32)
Calcium: 8.2 mg/dL — ABNORMAL LOW (ref 8.9–10.3)
Chloride: 100 mmol/L (ref 98–111)
Creatinine, Ser: 0.74 mg/dL (ref 0.44–1.00)
GFR, Estimated: >60 mL/min (ref >60)
Glucose, Bld: 90 mg/dL (ref 70–99)
Potassium: 3.5 mmol/L (ref 3.5–5.1)
Sodium: 133 mmol/L — ABNORMAL LOW (ref 135–145)

## 2021-04-17 MED ORDER — FERROUS SULFATE 325 (65 FE) MG PO TABS
325.0000 mg | ORAL_TABLET | Freq: Every day | ORAL | Status: DC
  Administered 2021-04-18 – 2021-04-19 (×2): 325 mg via ORAL
  Filled 2021-04-17 (×2): qty 1

## 2021-04-17 MED ORDER — MAGNESIUM SULFATE 2 GM/50ML IV SOLN
2.0000 g | Freq: Once | INTRAVENOUS | Status: AC
  Administered 2021-04-17: 2 g via INTRAVENOUS
  Filled 2021-04-17: qty 50

## 2021-04-17 MED ORDER — POTASSIUM CHLORIDE CRYS ER 10 MEQ PO TBCR
10.0000 meq | EXTENDED_RELEASE_TABLET | Freq: Once | ORAL | Status: AC
  Administered 2021-04-17: 10 meq via ORAL
  Filled 2021-04-17: qty 1

## 2021-04-17 MED ORDER — SODIUM CHLORIDE 0.9 % IV SOLN: 300.0000 mg | Freq: Once | INTRAVENOUS | Status: DC

## 2021-04-17 MED ORDER — SODIUM CHLORIDE 0.9 % IV SOLN
250.0000 mg | Freq: Once | INTRAVENOUS | Status: AC
  Administered 2021-04-17: 250 mg via INTRAVENOUS
  Filled 2021-04-17: qty 20

## 2021-04-17 NOTE — Progress Notes (Signed)
Progress note  Patient: Stacy Dalton                            PCP: [name], FNP                    DOB: December 22, 1950            DOA: 04/14/2021 [id number]             DOS: 04/17/2021, 10:46 AM  [name], FNP  Patient coming from:   HOME  I have personally reviewed patient's medical records, in electronic medical records, including:  [place] link, and care everywhere.    Objective:   The patient was seen and examined this morning, sleepy, but cooperative.  Yesterday evening patient became more agitated, confused, Precedex was reinitiated 04/16/21   SARS-CoV-2 positive, patient reports not vaccinated-does not believe in vaccine In no respiratory distress, sat normal ting 90% on room air    History of present illness:    Stacy Dalton is a 70 y.o. female with medical history significant of COPD, HTN, HLD, hypothyroidism, migraine headaches, chronic pain syndrome, alcohol abuse history of SBO, with colostomy and takedown, B12 deficiency... Presenting to the ED with significant agitation, tremors, potation admitted to chronic alcohol use.  Admitted to drinking the night before,   Stating she is hurting everywhere, unable to eat or drink much. Admits that she has been drinking now over a year now approximately 1 bottle of vodka a day. She lives alone and she does not remember how much exactly she ate yesterday, recent evaluation for her symptoms 10/7 to [facility] ED.  Patient Denies having: Fever, Chills, Cough, SOB, Chest Pain, N/V/D, headache, dizziness, lightheadedness,  Dysuria, Joint pain, rash, open wounds  ED Course:   Abnormal labs; hemoglobin 10.2, potassium 2.8, creatinine 1.03, calcium 7.4, glucose 119, magnesium 1.1    Assessment / Plan:   Principal Problem:   Alcohol withdrawal (HCC) Active Problems:   COPD (chronic obstructive pulmonary disease) (HCC)   Hypertension   HLD (hyperlipidemia)   Depression   Chronic pain  syndrome   Hypothyroidism   Anxiety   GERD (gastroesophageal reflux disease)   Alcohol use disorder, severe, dependence (HCC)   Principal Problem:    Alcohol withdrawal ([place]) -Precedex was discontinued yesterday morning, by the evening time patient become more restless, confused agitated tremors Precedex was reinitiated.  -Mildly hypertensive otherwise stable mood  -With extensive history of alcohol abuse, presented with tachycardia, tachypnea, hypertension, tremors --EtOH withdrawal symptoms, with significant electrolyte normalities and hypomagnesemia -Patient will be admitted to stepdown unit for close observation -Continue IV fluid hydration -Patient has been initiated on CIWA protocol COVID continued -Repleting thiamine folate, potassium, magnesium -Has been on Precedex drip for past 2 days, weaning off, will discontinue today   SARS-CoV-2 positive  In no respiratory distress, satting 93% on room air Denies any fever, shortness of breath, cough or chest pain Complain of generalized aches and pain Not a candidate for Plex admit due to elevated LFTs Continue isolation Patient reports she is not vaccinated  hypokalemia, hypomagnesemia -Potassium 2.8, magnesium 1.1, was repleted p.o. and IV in ED,  -Monitor closely, repleting accordingly  Active Problems:    COPD (chronic obstructive pulmonary disease)  -currently stable, as needed O2 supplement, and DuoNeb bronchodilators -Stable, in no acute distress    Hypertension - -Due to brief episode of hypotension on this admission,  patient home medication of amlodipine has been discontinued -home medication of amlodipine/ Olmesartan -on hold -As needed hydralazine -Adding metoprolol 25 mg p.o. 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  Anxiety/depression -resuming Cymbalta, stable-on Precedex, as needed Ativan    Chronic pain syndrome -continue as needed narcotics     Hypothyroidism -continue Synthroid    GERD (gastroesophageal reflux disease) -we will continue PPI    Alcohol use disorder, severe, dependence ([place]) -extensive consulting regarding alcohol cessation  Severe debility-falls, knee pain -Patient was assessed, difficulty with ambulation, fall likely due to generalized weaknesses, debility exacerbated by alcohol consumption -No signs of fracture, x-ray of knee negative -PT OT consulted, recommending SNF   Chronic anemia likely iron deficiency anemia due to chronic alcohol abuse -Monitoring H&H closely -Iron studies -total iron 26 low, TIBC 3.6, percent saturation ratio 8 -once tolerating p.o., initiating iron supplements  Cultures:  -none  Antimicrobial: -None  Consults called:  None -------------------------------------------------------------------------------------------------------------------------------------------- DVT prophylaxis: SCD/Compression stockings and Heparin SQ Code Status:   Code Status: Full Code   Admission status:  Patient will be admitted as Inpatient, with a greater than 2 midnight length of stay. Level of care: ICU   Family Communication:  none at bedside -alert oriented x4 discussed with patient (The above findings and plan of care has been discussed with patient in detail, the patient expressed understanding and agreement of above plan)  --------------------------------------------------------------------------------------------------------------------------------------------------  Disposition Plan: >3 days Status is: Inpatient  Remains inpatient appropriate because:Hemodynamically unstable, Persistent severe electrolyte disturbances, and Inpatient level of care appropriate due to severity of illness  Dispo: The patient is from: Home              Anticipated d/c is to: SNF in 2 days --agreeable to SNF possibly Monday, 04/18/2021              Patient currently is not medically stable to d/c.    Difficult to place patient No  ----------------------------------------------------------------------------------------------------------------------  Allergies  Allergen Reactions   Librium [Chlordiazepoxide]     Became unresponsive   Toradol [Ketorolac Tromethamine] Shortness Of Breath   Butalbital-Apap-Caffeine Other (See Comments)    jittery   Demerol Swelling   Esgic [Butalbital-Apap-Caffeine] Other (See Comments)    jittery   Iodine Other (See Comments)    bp bottomed out per pt several hours later; unsure if pre medicated in past with cm; done in [place]    vitamin C  500 mg Oral Daily   Chlorhexidine Gluconate Cloth  6 each Topical Q0600   docusate sodium  100 mg Oral Daily   DULoxetine  60 mg Oral Daily   [START ON 04/18/2021] ferrous sulfate  325 mg Oral Q breakfast   fluticasone furoate-vilanterol  1 puff Inhalation Daily   folic acid  1 mg Oral Daily   heparin  5,000 Units Subcutaneous Q8H   levothyroxine  50 mcg Oral Q0600   metoprolol tartrate  25 mg Oral BID   sodium chloride flush  3 mL Intravenous Q12H   thiamine  100 mg Oral Daily   Or   thiamine  100 mg Intravenous Daily   Vitamin D (Ergocalciferol)  50,000 Units Oral Q7 days   zinc sulfate  220 mg Oral Daily      PRN MEDs: acetaminophen **OR** acetaminophen, bisacodyl, clonazePAM, hydrALAZINE, HYDROmorphone (DILAUDID) injection, ipratropium, levalbuterol, LORazepam, ondansetron **OR** ondansetron (ZOFRAN) IV, oxyCODONE, senna-docusate, traZODone  Past Medical History:  Diagnosis Date   Chronic pain  COPD (chronic obstructive pulmonary disease) ([place])    told has copd, no current inhaler use   Cough    Depression    ETOH abuse    GERD (gastroesophageal reflux disease)    Hypertension    Hypothyroidism    Insomnia    Migraine    Migraine    Osteopenia    Pain management    Panic attacks    Small bowel obstruction ([place])      reports that she quit smoking about 22 years ago. Her smoking use  included cigarettes. She has a 30.00 pack-year smoking history. She has never used smokeless tobacco. She reports current alcohol use. She reports that she does not use drugs.   Physical Exam:   Blood pressure (!) 90/52, pulse 74, temperature 98.4 F (36.9 C), resp. rate (!) 7, height 5\' 1"  (1.549 m), weight 50 kg, SpO2 98 %.      Physical Exam:   General:  Alert, oriented, cooperative, no distress;   HEENT:  Normocephalic, PERRL, otherwise with in Normal limits   Neuro:  CNII-XII intact. , normal motor and sensation, reflexes intact   Lungs:   Clear to auscultation BL, Respirations unlabored, no wheezes / crackles  Cardio:    S1/S2, RRR, No murmure, No Rubs or Gallops   Abdomen:   Soft, non-tender, bowel sounds active all four quadrants,  no guarding or peritoneal signs.  Muscular skeletal:  Limited exam - in bed, able to move all 4 extremities, global generalized weaknesses 2+ pulses,  symmetric, No pitting edema  Skin:  Dry, warm to touch, negative for any Rashes,  Wounds: Please see nursing documentation             Labs on admission:    I have personally reviewed following labs and imaging studies  CBC: Recent Labs  Lab 04/14/21 0842 04/15/21 0452 04/16/21 0349 04/17/21 0411  WBC 8.2 4.4 3.8* 4.0  NEUTROABS 6.6  --   --   --   HGB 10.2* 8.0* 8.4* 8.5*  HCT 30.5* 25.5* 26.9* 27.5*  MCV 80.9 84.7 85.9 84.6  PLT 278 153 131* 741*   Basic Metabolic Panel: Recent Labs  Lab 04/14/21 0842 04/14/21 0843 04/15/21 0452 04/16/21 0349 04/16/21 1212 04/17/21 0411  NA 136  --  135 134*  --  133*  K 2.8*  --  3.3* 3.9  --  3.5  CL 100  --  105 104  --  100  CO2 19*  --  24 24  --  28  GLUCOSE 119*  --  122* 118*  --  90  BUN 16  --  9 9  --  8  CREATININE 1.03*  --  0.72 0.76  --  0.74  CALCIUM 7.4*  --  7.1* 7.7*  --  8.2*  MG  --  1.1* 1.5*  --  1.8  --    GFR: Estimated Creatinine Clearance: 48 mL/min (by C-G formula based on SCr of 0.74 mg/dL). Liver  Function Tests: Recent Labs  Lab 04/14/21 0842  AST 55*  ALT 25  ALKPHOS 154*  BILITOT 1.3*  PROT 6.1*  ALBUMIN 3.4*   No results for input(s): LIPASE, AMYLASE in the last 168 hours. Recent Labs  Lab 04/14/21 0842  AMMONIA 13   Coagulation Profile: Recent Labs  Lab 04/14/21 0842 04/15/21 0452  INR 1.1 1.1   Cardiac Enzymes: Recent Labs  Lab 04/14/21 0842  CKTOTAL 171   BNP (last 3 results) No  results for input(s): PROBNP in the last 8760 hours. HbA1C: No results for input(s): HGBA1C in the last 72 hours. CBG: Recent Labs  Lab 04/14/21 0829 04/15/21 0745 04/16/21 0812  GLUCAP 118* 120* 101*   Lipid Profile: No results for input(s): CHOL, HDL, LDLCALC, TRIG, CHOLHDL, LDLDIRECT in the last 72 hours. Thyroid Function Tests: Recent Labs    04/16/21 0741  TSH 4.367   Anemia Panel: Recent Labs    04/16/21 0741  VITAMINB12 349  TIBC 326  IRON 26*   Urine analysis:    Component Value Date/Time   COLORURINE YELLOW 04/14/2021 1551   APPEARANCEUR CLOUDY (A) 04/14/2021 1551   APPEARANCEUR Cloudy (A) 02/23/2021 1608   LABSPEC 1.013 04/14/2021 1551   PHURINE 8.0 04/14/2021 1551   GLUCOSEU NEGATIVE 04/14/2021 1551   HGBUR SMALL (A) 04/14/2021 1551   BILIRUBINUR NEGATIVE 04/14/2021 1551   BILIRUBINUR Negative 02/23/2021 1608   KETONESUR 5 (A) 04/14/2021 1551   PROTEINUR 30 (A) 04/14/2021 1551   UROBILINOGEN negative 07/01/2015 1541   UROBILINOGEN 0.2 10/07/2012 1928   NITRITE NEGATIVE 04/14/2021 1551   LEUKOCYTESUR MODERATE (A) 04/14/2021 1551     Radiologic Exams on Admission:   No results found.  EKG:   Independently reviewed.  Orders placed or performed during the hospital encounter of 04/14/21   ED EKG   ED EKG   EKG 12-Lead   EKG 12-Lead   EKG 12-Lead   EKG 12-Lead   EKG 12-Lead   EKG    ----------------------------------------------------------------------------------------------------------------------------  -------------------------------------------------------------------------------------------------------------------------  Time spent: > than  56 Min.  Of critical time was spent seeing stabilizing the patient discussing plan of care patient and ICU staff.  SIGNED: Deatra James, MD, FHM. [facility],  Pager (Please use [email or web address] to page to text)  If 7PM-7AM, please contact night-coverage [email or web address],  04/17/2021, 10:46 AM Time spent > 55 minutes Evaluation, reviewing all labs, drawn plan of care, discussion with plan of care with ICU team

## 2021-04-18 ENCOUNTER — Inpatient Hospital Stay (HOSPITAL_COMMUNITY): Payer: Medicare Other

## 2021-04-18 LAB — BASIC METABOLIC PANEL
Anion gap: 9 (ref 5–15)
BUN: 7 mg/dL — ABNORMAL LOW (ref 8–23)
CO2: 27 mmol/L (ref 22–32)
Calcium: 8.5 mg/dL — ABNORMAL LOW (ref 8.9–10.3)
Chloride: 95 mmol/L — ABNORMAL LOW (ref 98–111)
Creatinine, Ser: 0.74 mg/dL (ref 0.44–1.00)
GFR, Estimated: >60 mL/min (ref >60)
Glucose, Bld: 95 mg/dL (ref 70–99)
Potassium: 3.2 mmol/L — ABNORMAL LOW (ref 3.5–5.1)
Sodium: 131 mmol/L — ABNORMAL LOW (ref 135–145)

## 2021-04-18 LAB — LACTIC ACID, PLASMA: Lactic Acid, Venous: 0.8 mmol/L (ref 0.5–1.9)

## 2021-04-18 LAB — OCCULT BLOOD X 1 CARD TO LAB, STOOL: Fecal Occult Bld: NEGATIVE

## 2021-04-18 LAB — CBC
HCT: 32.2 % — ABNORMAL LOW (ref 36.0–46.0)
Hemoglobin: 10 g/dL — ABNORMAL LOW (ref 12.0–15.0)
MCH: 26 pg (ref 26.0–34.0)
MCHC: 31.1 g/dL (ref 30.0–36.0)
MCV: 83.6 fL (ref 80.0–100.0)
Platelets: 119 10*3/uL — ABNORMAL LOW (ref 150–400)
RBC: 3.85 MIL/uL — ABNORMAL LOW (ref 3.87–5.11)
RDW: 17.2 % — ABNORMAL HIGH (ref 11.5–15.5)
WBC: 4 10*3/uL (ref 4.0–10.5)
nRBC: 0 % (ref 0.0–0.2)

## 2021-04-18 LAB — PROCALCITONIN: Procalcitonin: 0.29 ng/mL

## 2021-04-18 LAB — GLUCOSE, CAPILLARY: Glucose-Capillary: 94 mg/dL (ref 70–99)

## 2021-04-18 MED ORDER — GABAPENTIN 100 MG PO CAPS
200.0000 mg | ORAL_CAPSULE | Freq: Three times a day (TID) | ORAL | Status: DC
  Administered 2021-04-18 – 2021-04-19 (×4): 200 mg via ORAL
  Filled 2021-04-18 (×4): qty 2

## 2021-04-18 MED ORDER — POTASSIUM CHLORIDE CRYS ER 20 MEQ PO TBCR
40.0000 meq | EXTENDED_RELEASE_TABLET | Freq: Once | ORAL | Status: AC
  Administered 2021-04-18: 40 meq via ORAL
  Filled 2021-04-18: qty 2

## 2021-04-18 NOTE — Progress Notes (Signed)
Progress note  Patient: Stacy Dalton                            PCP: [name], FNP                    DOB: Jul 07, 1950            DOA: 04/14/2021 TDD:[id number]             DOS: 04/18/2021, 12:20 PM  [name], FNP  Patient coming from:   HOME  I have personally reviewed patient's medical records, in electronic medical records, including:  [place] link, and care everywhere.    Objective:   The patient was seen and examined this morning, much more awake alert, following command, per nursing staff Precedex has been discontinued last night 04/17/2021  she has been stable hemodynamically   SARS-CoV-2 positive, patient reports not vaccinated-does not believe in vaccine In no respiratory distress, sat normal ting 90% on room air    History of present illness:    Stacy Dalton is a 70 y.o. female with medical history significant of COPD, HTN, HLD, hypothyroidism, migraine headaches, chronic pain syndrome, alcohol abuse history of SBO, with colostomy and takedown, B12 deficiency... Presenting to the ED with significant agitation, tremors, potation admitted to chronic alcohol use.  Admitted to drinking the night before,   Stating she is hurting everywhere, unable to eat or drink much. Admits that she has been drinking now over a year now approximately 1 bottle of vodka a day. She lives alone and she does not remember how much exactly she ate yesterday, recent evaluation for her symptoms 10/7 to [facility] ED.  Patient Denies having: Fever, Chills, Cough, SOB, Chest Pain, N/V/D, headache, dizziness, lightheadedness,  Dysuria, Joint pain, rash, open wounds  ED Course:   Abnormal labs; hemoglobin 10.2, potassium 2.8, creatinine 1.03, calcium 7.4, glucose 119, magnesium 1.1    Assessment / Plan:   Principal Problem:   Alcohol withdrawal (HCC) Active Problems:   COPD (chronic obstructive pulmonary disease) (HCC)   Hypertension   HLD (hyperlipidemia)    Depression   Chronic pain syndrome   Hypothyroidism   Anxiety   GERD (gastroesophageal reflux disease)   Alcohol use disorder, severe, dependence (HCC)   Principal Problem:    Alcohol withdrawal ([place]) -Hemodynamically stable, Precedex has been discontinued overnight    -With extensive history of alcohol abuse, presented with tachycardia, tachypnea, hypertension, tremors --EtOH withdrawal symptoms, with significant electrolyte normalities and hypomagnesemia -S/P IV fluid hydration  -Patient has been initiated on CIWA protocol, Precedex, COVID infection, -Repleting thiamine folate, potassium, magnesium -Has been on Precedex drip for3 days, weaning off, discontinued   SARS-CoV-2 positive  T-max 102.1.Marland KitchenMarland Kitchen Currently 100.2 -Obtaining UA and chest x-ray ruling out any other source of infection WBC 4.0,  In no respite distress, satting 97% on room air Denies any fever, shortness of breath, cough or chest pain Complain of generalized aches and pain Not a candidate for Plex admit due to elevated LFTs Continue isolation Patient reports she is not vaccinated  hypokalemia, hypomagnesemia -Potassium 2.8, magnesium 1.1, was repleted p.o. and IV in ED,  -Monitor closely, repleting accordingly  Active Problems:    COPD (chronic obstructive pulmonary disease)  -currently stable, as needed O2 supplement, and DuoNeb bronchodilators -Stable, in no acute distress    Hypertension - -Due to brief episode of hypotension on this admission, patient home  medication of amlodipine has been discontinued -home medication of amlodipine/ Olmesartan -discontinued -As needed hydralazine -Adding metoprolol 25 mg p.o. 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  Anxiety/depression -resuming Cymbalta, stable-on Precedex, as needed Ativan    Chronic pain syndrome -continue as needed narcotics, Cymbalta, Neurontin added    Hypothyroidism -continue  Synthroid    GERD (gastroesophageal reflux disease) -we will continue PPI    Alcohol use disorder, severe, dependence ([place]) -extensive consulting regarding alcohol cessation  Severe debility-falls, knee pain -Patient was assessed, difficulty with ambulation, fall likely due to generalized weaknesses, debility exacerbated by alcohol consumption -No signs of fracture, x-ray of knee negative -PT OT consulted, recommending SNF   Chronic anemia likely iron deficiency anemia due to chronic alcohol abuse -Monitoring H&H closely -Hemoglobin 8.5, 10.0, -Iron studies -total iron 26 low, TIBC 3.6, percent saturation ratio 8 -once tolerating p.o., initiating iron supplements  Cultures:  -none  Antimicrobial: -None  Consults called:  None -------------------------------------------------------------------------------------------------------------------------------------------- DVT prophylaxis: SCD/Compression stockings and Heparin SQ Code Status:   Code Status: Full Code   Admission status:  Patient will be admitted as Inpatient, with a greater than 2 midnight length of stay. Level of care: ICU   Family Communication:  none at bedside -alert oriented x4 discussed with patient (The above findings and plan of care has been discussed with patient in detail, the patient expressed understanding and agreement of above plan)  --------------------------------------------------------------------------------------------------------------------------------------------------  Disposition Plan: >3 days Status is: Inpatient  Remains inpatient appropriate because:Hemodynamically unstable, Persistent severe electrolyte disturbances, and Inpatient level of care appropriate due to severity of illness  Dispo: The patient is from: Home              Anticipated d/c is to: SNF in 2 days --agreeable to SNF possibly Monday, 04/18/2021              Patient currently is not medically stable to d/c.    Difficult to place patient No  ----------------------------------------------------------------------------------------------------------------------  Allergies  Allergen Reactions   Librium [Chlordiazepoxide]     Became unresponsive   Toradol [Ketorolac Tromethamine] Shortness Of Breath   Butalbital-Apap-Caffeine Other (See Comments)    jittery   Demerol Swelling   Esgic [Butalbital-Apap-Caffeine] Other (See Comments)    jittery   Iodine Other (See Comments)    bp bottomed out per pt several hours later; unsure if pre medicated in past with cm; done in [place]    vitamin C  500 mg Oral Daily   Chlorhexidine Gluconate Cloth  6 each Topical Q0600   docusate sodium  100 mg Oral Daily   DULoxetine  60 mg Oral Daily   ferrous sulfate  325 mg Oral Q breakfast   fluticasone furoate-vilanterol  1 puff Inhalation Daily   folic acid  1 mg Oral Daily   gabapentin  200 mg Oral TID   heparin  5,000 Units Subcutaneous Q8H   levothyroxine  50 mcg Oral Q0600   metoprolol tartrate  25 mg Oral BID   sodium chloride flush  3 mL Intravenous Q12H   thiamine  100 mg Oral Daily   Or   thiamine  100 mg Intravenous Daily   Vitamin D (Ergocalciferol)  50,000 Units Oral Q7 days   zinc sulfate  220 mg Oral Daily      PRN MEDs: acetaminophen **OR** acetaminophen, bisacodyl, clonazePAM, hydrALAZINE, HYDROmorphone (DILAUDID) injection, ipratropium, levalbuterol, LORazepam, ondansetron **OR** ondansetron (ZOFRAN) IV, oxyCODONE, senna-docusate, traZODone  Past Medical  History:  Diagnosis Date   Chronic pain    COPD (chronic obstructive pulmonary disease) ([place])    told has copd, no current inhaler use   Cough    Depression    ETOH abuse    GERD (gastroesophageal reflux disease)    Hypertension    Hypothyroidism    Insomnia    Migraine    Migraine    Osteopenia    Pain management    Panic attacks    Small bowel obstruction ([place])      reports that she quit smoking about 22 years ago. Her  smoking use included cigarettes. She has a 30.00 pack-year smoking history. She has never used smokeless tobacco. She reports current alcohol use. She reports that she does not use drugs.   Physical Exam:   Blood pressure (!) 90/52, pulse 74, temperature 98.4 F (36.9 C), resp. rate (!) 7, height 5\' 1"  (1.549 m), weight 50 kg, SpO2 98 %.       Physical Exam:   General:  Alert, oriented, cooperative, no distress;   HEENT:  Normocephalic, PERRL, otherwise with in Normal limits   Neuro:  CNII-XII intact. , normal motor and sensation, reflexes intact   Lungs:   Clear to auscultation BL, Respirations unlabored, no wheezes / crackles  Cardio:    S1/S2, RRR, No murmure, No Rubs or Gallops   Abdomen:   Soft, non-tender, bowel sounds active all four quadrants,  no guarding or peritoneal signs.  Muscular skeletal:  Limited exam - in bed, able to move all 4 extremities, Normal strength,  2+ pulses,  symmetric, No pitting edema  Skin:  Dry, warm to touch, negative for any Rashes,  Wounds: Please see nursing documentation                Labs on admission:    I have personally reviewed following labs and imaging studies  CBC: Recent Labs  Lab 04/14/21 0842 04/15/21 0452 04/16/21 0349 04/17/21 0411 04/18/21 0525  WBC 8.2 4.4 3.8* 4.0 4.0  NEUTROABS 6.6  --   --   --   --   HGB 10.2* 8.0* 8.4* 8.5* 10.0*  HCT 30.5* 25.5* 26.9* 27.5* 32.2*  MCV 80.9 84.7 85.9 84.6 83.6  PLT 278 153 131* 135* 960*   Basic Metabolic Panel: Recent Labs  Lab 04/14/21 0842 04/14/21 0843 04/15/21 0452 04/16/21 0349 04/16/21 1212 04/17/21 0411 04/18/21 0525  NA 136  --  135 134*  --  133* 131*  K 2.8*  --  3.3* 3.9  --  3.5 3.2*  CL 100  --  105 104  --  100 95*  CO2 19*  --  24 24  --  28 27  GLUCOSE 119*  --  122* 118*  --  90 95  BUN 16  --  9 9  --  8 7*  CREATININE 1.03*  --  0.72 0.76  --  0.74 0.74  CALCIUM 7.4*  --  7.1* 7.7*  --  8.2* 8.5*  MG  --  1.1* 1.5*  --  1.8  --   --     GFR: Estimated Creatinine Clearance: 48 mL/min (by C-G formula based on SCr of 0.74 mg/dL). Liver Function Tests: Recent Labs  Lab 04/14/21 0842  AST 55*  ALT 25  ALKPHOS 154*  BILITOT 1.3*  PROT 6.1*  ALBUMIN 3.4*   No results for input(s): LIPASE, AMYLASE in the last 168 hours. Recent Labs  Lab 04/14/21 [phone]  AMMONIA 13   Coagulation Profile: Recent Labs  Lab 04/14/21 0842 04/15/21 0452  INR 1.1 1.1   Cardiac Enzymes: Recent Labs  Lab 04/14/21 0842  CKTOTAL 171   BNP (last 3 results) No results for input(s): PROBNP in the last 8760 hours. HbA1C: No results for input(s): HGBA1C in the last 72 hours. CBG: Recent Labs  Lab 04/14/21 0829 04/15/21 0745 04/16/21 0812 04/18/21 0753  GLUCAP 118* 120* 101* 94   Lipid Profile: No results for input(s): CHOL, HDL, LDLCALC, TRIG, CHOLHDL, LDLDIRECT in the last 72 hours. Thyroid Function Tests: Recent Labs    04/16/21 0741  TSH 4.367   Anemia Panel: Recent Labs    04/16/21 0741  VITAMINB12 349  TIBC 326  IRON 26*   Urine analysis:    Component Value Date/Time   COLORURINE YELLOW 04/14/2021 1551   APPEARANCEUR CLOUDY (A) 04/14/2021 1551   APPEARANCEUR Cloudy (A) 02/23/2021 1608   LABSPEC 1.013 04/14/2021 1551   PHURINE 8.0 04/14/2021 1551   GLUCOSEU NEGATIVE 04/14/2021 1551   HGBUR SMALL (A) 04/14/2021 1551   BILIRUBINUR NEGATIVE 04/14/2021 1551   BILIRUBINUR Negative 02/23/2021 1608   KETONESUR 5 (A) 04/14/2021 1551   PROTEINUR 30 (A) 04/14/2021 1551   UROBILINOGEN negative 07/01/2015 1541   UROBILINOGEN 0.2 10/07/2012 1928   NITRITE NEGATIVE 04/14/2021 1551   LEUKOCYTESUR MODERATE (A) 04/14/2021 1551     Radiologic Exams on Admission:   No results found.  EKG:   Independently reviewed.  Orders placed or performed during the hospital encounter of 04/14/21   ED EKG   ED EKG   EKG 12-Lead   EKG 12-Lead   EKG 12-Lead   EKG 12-Lead   EKG 12-Lead   EKG    ----------------------------------------------------------------------------------------------------------------------------  -------------------------------------------------------------------------------------------------------------------------  Time spent: > than  55 Min.[name]  SIGNED: Deatra James, MD, FHM. [facility],  Pager (Please use [email or web address] to page to text)  If 7PM-7AM, please contact night-coverage [email or web address],  04/18/2021, 12:20 PM Time spent > 55 minutes Evaluation, reviewing all labs, drawn plan of care, discussion with plan of care with ICU team

## 2021-04-18 NOTE — TOC Progression Note (Signed)
Transition of Care [facility]) - Progression Note    Patient Details  Name: Stacy Dalton MRN: [id number] Date of Birth: July 11, 1950  Transition of Care [facility]) CM/SW Contact  Salome Arnt, [place] Phone Number: 04/18/2021, 10:36 AM  Clinical Narrative:  Pt accepts bed offer at [facility]. Facility notified. Anticipate d/c 1-2 days per MD. TOC will continue to follow.      Expected Discharge Plan: [place] Barriers to Discharge: Continued Medical Work up  Expected Discharge Plan and Services Expected Discharge Plan: [place] arrangements for the past 2 months: Single Family Home                                       Social Determinants of Health (SDOH) Interventions    Readmission Risk Interventions Readmission Risk Prevention Plan 04/15/2021  Transportation Screening Complete  Medication Review [employer]) Complete  HRI or Home Care Consult Complete  SW Recovery Care/Counseling Consult Complete  Palliative Care Screening Not Applicable  Skilled Nursing Facility Complete  Some recent data might be hidden

## 2021-04-19 LAB — CBC
HCT: 32.3 % — ABNORMAL LOW (ref 36.0–46.0)
Hemoglobin: 10.2 g/dL — ABNORMAL LOW (ref 12.0–15.0)
MCH: 26.4 pg (ref 26.0–34.0)
MCHC: 31.6 g/dL (ref 30.0–36.0)
MCV: 83.7 fL (ref 80.0–100.0)
Platelets: 105 10*3/uL — ABNORMAL LOW (ref 150–400)
RBC: 3.86 MIL/uL — ABNORMAL LOW (ref 3.87–5.11)
RDW: 17.7 % — ABNORMAL HIGH (ref 11.5–15.5)
WBC: 3.1 10*3/uL — ABNORMAL LOW (ref 4.0–10.5)
nRBC: 0 % (ref 0.0–0.2)

## 2021-04-19 LAB — BASIC METABOLIC PANEL
Anion gap: 8 (ref 5–15)
BUN: 13 mg/dL (ref 8–23)
CO2: 27 mmol/L (ref 22–32)
Calcium: 8.3 mg/dL — ABNORMAL LOW (ref 8.9–10.3)
Chloride: 98 mmol/L (ref 98–111)
Creatinine, Ser: 0.82 mg/dL (ref 0.44–1.00)
GFR, Estimated: >60 mL/min (ref >60)
Glucose, Bld: 100 mg/dL — ABNORMAL HIGH (ref 70–99)
Potassium: 3.3 mmol/L — ABNORMAL LOW (ref 3.5–5.1)
Sodium: 133 mmol/L — ABNORMAL LOW (ref 135–145)

## 2021-04-19 MED ORDER — GABAPENTIN 100 MG PO CAPS: 300.0000 mg | ORAL_CAPSULE | Freq: Three times a day (TID) | ORAL | 2 refills | Status: DC

## 2021-04-19 MED ORDER — METOPROLOL TARTRATE 25 MG PO TABS: 25.0000 mg | ORAL_TABLET | Freq: Two times a day (BID) | ORAL | 0 refills | Status: DC

## 2021-04-19 MED ORDER — POTASSIUM CHLORIDE CRYS ER 20 MEQ PO TBCR
40.0000 meq | EXTENDED_RELEASE_TABLET | Freq: Once | ORAL | Status: AC
  Administered 2021-04-19: 40 meq via ORAL
  Filled 2021-04-19: qty 2

## 2021-04-19 MED ORDER — FOLIC ACID 1 MG PO TABS: 1.0000 mg | ORAL_TABLET | Freq: Every day | ORAL | 0 refills | Status: AC

## 2021-04-19 MED ORDER — THIAMINE HCL 100 MG PO TABS: 100.0000 mg | ORAL_TABLET | Freq: Every day | ORAL | 0 refills | Status: AC

## 2021-04-19 MED ORDER — ADULT MULTIVITAMIN W/MINERALS CH: 1.0000 | ORAL_TABLET | Freq: Every day | ORAL | 0 refills | Status: DC

## 2021-04-19 MED ORDER — FLUTICASONE-SALMETEROL 250-50 MCG/ACT IN AEPB: 1.0000 | INHALATION_SPRAY | Freq: Two times a day (BID) | RESPIRATORY_TRACT | 2 refills | Status: DC

## 2021-04-19 MED ORDER — FERROUS SULFATE 325 (65 FE) MG PO TABS: 325.0000 mg | ORAL_TABLET | Freq: Every day | ORAL | 0 refills | Status: DC

## 2021-04-19 NOTE — TOC Transition Note (Addendum)
Transition of Care [facility]) - CM/SW Discharge Note   Patient Details  Name: Stacy Dalton MRN: [id number] Date of Birth: 12-Mar-1951  Transition of Care [facility]) CM/SW Contact:  Boneta Lucks, RN Phone Number: 04/19/2021, 2:22 PM   Clinical Narrative:   Patient medically ready to discharge [facility]. Debbie provided number for report and room number.  TOC spoke to Son and Friend both are agreeable with [facility]. TOC called EMS at RN's request.   Patient given SA resources.  Final next level of care: Skilled Nursing Facility Barriers to Discharge: Barriers Resolved   Patient Goals and CMS Choice Patient states their goals for this hospitalization and ongoing recovery are:: to go to SNF CMS Medicare.gov Compare Post Acute Care list provided to:: Patient Choice offered to / list presented to : Patient  Discharge Placement            Patient to be transferred to facility by: EMS Name of family member notified: SON/ Friend Patient and family notified of of transfer: 04/19/21    Readmission Risk Interventions Readmission Risk Prevention Plan 04/19/2021 04/15/2021  Transportation Screening - Complete  Medication Review Press photographer) - Complete  PCP or Specialist appointment within 3-5 days of discharge Complete -  [place] or [place] - Complete  SW Recovery Care/Counseling Consult - Complete  [place] - Complete  Some recent data might be hidden

## 2021-04-19 NOTE — Discharge Summary (Signed)
Physician Discharge Summary [facility]    Patient: Stacy Dalton                   Admit date: 04/14/2021   DOB: [birth year]             Discharge date:04/19/2021/9:29 AM ERD:[id number]                          PCP: [name], FNP  Disposition: SNF   Recommendations for Outpatient Follow-up:   Follow up: PCP in 1-2 wks  Labs CBC within 2-4 weeks report PCP  Discharge Condition: Stable   Code Status:   Code Status: Full Code  Diet recommendation: Regular healthy diet   Discharge Diagnoses:    Principal Problem:   Alcohol withdrawal ([place]) Active Problems:   COPD (chronic obstructive pulmonary disease) ([place])   Hypertension   HLD (hyperlipidemia)   Depression   Chronic pain syndrome   Hypothyroidism   Anxiety   GERD (gastroesophageal reflux disease)   Alcohol use disorder, severe, dependence ([place])   COVID-19 virus infection   Hypotensive episode   History of Present Illness/ Hospital Course Stacy Dalton Summary:   Stacy Dalton is a 70 y.o. female with medical history significant of COPD, HTN, HLD, hypothyroidism, migraine headaches, chronic pain syndrome, alcohol abuse history of SBO, with colostomy and takedown, B12 deficiency... Presenting to the ED with significant agitation, tremors, potation admitted to chronic alcohol use.  Admitted to drinking the night before,   Stating she is hurting everywhere, unable to eat or drink much. Admits that she has been drinking now over a year now approximately 1 bottle of vodka a day. She lives alone and she does not remember how much exactly she ate yesterday, recent evaluation for her symptoms 10/7 to [facility] ED.   Patient Denies having: Fever, Chills, Cough, SOB, Chest Pain, N/V/D, headache, dizziness, lightheadedness,  Dysuria, Joint pain, rash, open wounds   ED Course:   Abnormal labs; hemoglobin 10.2, potassium 2.8, creatinine 1.03, calcium 7.4, glucose 119, magnesium 1.1       Alcohol  withdrawal (HCC) -Hemodynamically stable, much improved signs symptoms of withdrawal  -Precedex has been discontinued since 04/18/2021     -With extensive history of alcohol abuse, presented with tachycardia, tachypnea, hypertension, tremors --EtOH withdrawal symptoms, with significant electrolyte normalities and hypomagnesemia -S/P IV fluid hydration  -Patient has been initiated on CIWA protocol, Precedex, COVID infection, -Repleting thiamine folate, potassium, magnesium -Has been on Precedex drip for3 days, weaning off, discontinued     SARS-CoV-2 positive  T-max 102.1.Marland KitchenMarland Kitchen Currently 100.2.Marland KitchenMarland Kitchen Remained stable afebrile now normotensive -Obtaining UA and chest x-ray ruling out any other source of infection WBC 4.0,   In no respite distress, satting 97% on room air Denies any fever, shortness of breath, cough or chest pain Complain of generalized aches and pain Not a candidate for [place] admit due to elevated LFTs Continue isolation Patient reports she is not vaccinated   hypokalemia, hypomagnesemia -Potassium 2.8, magnesium 1.1, was repleted p.o. and IV in ED,  -Monitor closely, repleting accordingly   Active Problems:     COPD (chronic obstructive pulmonary disease)  -currently stable, as needed O2 supplement, and DuoNeb bronchodilators -Stable, in no acute distress     Hypertension - -Due to brief episode of hypotension on this admission, patient home medication of amlodipine has been discontinued -home medication of amlodipine/ Olmesartan -discontinued -As needed hydralazine -Adding metoprolol 25 mg p.o.  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   Anxiety/depression -resuming Cymbalta, stable-on Precedex, as needed Ativan     Chronic pain syndrome -continue as needed narcotics, Cymbalta, Neurontin added     Hypothyroidism -continue Synthroid    GERD (gastroesophageal reflux disease) -we will continue PPI    Alcohol  use disorder, severe, dependence ([place]) -extensive consulting regarding alcohol cessation   Severe debility-falls, knee pain -Patient was assessed, difficulty with ambulation, fall likely due to generalized weaknesses, debility exacerbated by alcohol consumption -No signs of fracture, x-ray of knee negative -PT OT consulted, recommending SNF--- continue PT     Chronic anemia likely iron deficiency anemia due to chronic alcohol abuse -Monitor H&H closely, remained stable, -Hemoglobin 8.5, 10.0, -Iron studies -total iron 26 low, TIBC 3.6, percent saturation ratio 8 -once tolerating p.o., initiating iron supplements   Cultures:  -none  Antimicrobial: -None   Consults called:  None -------------------------------------------------------------------------------------------------------------------------------------------- DVT prophylaxis: SCD/Compression stockings and Heparin SQ Code Status:   Code Status: Full Code     Admission status:  Patient will be admitted as Inpatient, with a greater than 2 midnight length of stay. Level of care: ICU     Family Communication:  none at bedside -alert oriented x4 discussed with patient (The above findings and plan of care has been discussed with patient in detail, the patient expressed understanding and agreement of above plan)  --------------------------------------------------------------------------------------------------------------------------------------------------   Disposition Plan: >3 days Status is: Inpatient   Remains inpatient appropriate because:Hemodynamically unstable, Persistent severe electrolyte disturbances, and Inpatient level of care appropriate due to severity of illness   Dispo: The patient is from: Home              Anticipated d/c is to: SNF       Discharge Instructions:   Discharge Instructions     Activity as tolerated - No restrictions   Complete by: As directed    Call MD for:  difficulty breathing,  headache or visual disturbances   Complete by: As directed    Call MD for:  temperature >100.4   Complete by: As directed    Diet - low sodium heart healthy   Complete by: As directed    Discharge instructions   Complete by: As directed    Follow-up with your PCP within 1 to 2 weeks   Increase activity slowly   Complete by: As directed         Medication List     STOP taking these medications    amLODipine-olmesartan 10-40 MG tablet Commonly known as: AZOR   amoxicillin-clavulanate 875-125 MG tablet Commonly known as: AUGMENTIN   Breo Ellipta 200-25 MCG/INH Aepb Generic drug: fluticasone furoate-vilanterol Replaced by: fluticasone-salmeterol 250-50 MCG/ACT Aepb   chlordiazePOXIDE 10 MG capsule Commonly known as: LIBRIUM   cyanocobalamin 1000 MCG/ML injection Commonly known as: (VITAMIN B-12)   fluconazole 150 MG tablet Commonly known as: DIFLUCAN   magnesium oxide 400 (241.3 Mg) MG tablet Commonly known as: MAG-OX   pantoprazole 40 MG tablet Commonly known as: PROTONIX   predniSONE 10 MG (21) Tbpk tablet Commonly known as: STERAPRED UNI-PAK 21 TAB       TAKE these medications    BC Fast Pain Relief 845-65 MG Pack Generic drug: Aspirin-Caffeine Take 1 packet by mouth daily as needed (pain or headache).   DULoxetine 60 MG capsule Commonly known as: CYMBALTA Take 1 capsule (60 mg total) by mouth daily.   ferrous sulfate 325 (  65 FE) MG tablet Take 1 tablet (325 mg total) by mouth daily with breakfast. Start taking on: April 20, 2021   fluticasone-salmeterol 250-50 MCG/ACT Aepb Commonly known as: Advair Diskus Inhale 1 puff into the lungs in the morning and at bedtime. Replaces: Breo Ellipta 200-25 MCG/INH Aepb   folic acid 1 MG tablet Commonly known as: FOLVITE Take 1 tablet (1 mg total) by mouth daily for 5 days. Start taking on: April 20, 2021   gabapentin 100 MG capsule Commonly known as: NEURONTIN Take 3 capsules (300 mg total) by mouth  3 (three) times daily.   levothyroxine 50 MCG tablet Commonly known as: Synthroid Take 1 tablet (50 mcg total) by mouth daily before breakfast.   metoprolol tartrate 25 MG tablet Commonly known as: LOPRESSOR Take 1 tablet (25 mg total) by mouth 2 (two) times daily.   multivitamin with minerals Tabs tablet Take 1 tablet by mouth daily.   ondansetron 4 MG tablet Commonly known as: Zofran Take 1 tablet (4 mg total) by mouth every 8 (eight) hours as needed for nausea or vomiting.   thiamine 100 MG tablet Take 1 tablet (100 mg total) by mouth daily for 5 days.   Vitamin D (Ergocalciferol) 1.25 MG (50000 UNIT) Caps capsule Commonly known as: DRISDOL Take 1 capsule (50,000 Units total) by mouth every 7 (seven) days.        Contact information for after-discharge care     [facility] .   Service: Skilled Nursing Contact information: [place] [address] [phone]                    Allergies  Allergen Reactions   Librium [Chlordiazepoxide]     Became unresponsive   Toradol [Ketorolac Tromethamine] Shortness Of Breath   Butalbital-Apap-Caffeine Other (See Comments)    jittery   Demerol Swelling   Esgic [Butalbital-Apap-Caffeine] Other (See Comments)    jittery   Iodine Other (See Comments)    bp bottomed out per pt several hours later; unsure if pre medicated in past with cm; done in [place]     Procedures /Studies:   CT Head Wo Contrast  Result Date: 04/14/2021 CLINICAL DATA:  Fall 2-3 days ago, confusion.  Pain. EXAM: CT HEAD WITHOUT CONTRAST TECHNIQUE: Contiguous axial images were obtained from the base of the skull through the vertex without intravenous contrast. COMPARISON:  04/08/2021 FINDINGS: Brain: The brainstem, cerebellum, cerebral peduncles, thalami, basal ganglia, basilar cisterns, and ventricular system appear within normal limits. No intracranial hemorrhage, mass  lesion, or acute CVA. Vascular: Unremarkable Skull: Unremarkable Sinuses/Orbits: Unremarkable Other: No supplemental non-categorized findings. IMPRESSION: 1. No significant intracranial abnormality is identified. Electronically Signed   By: Van Clines M.D.   On: 04/14/2021 09:39   CT Cervical Spine Wo Contrast  Result Date: 04/14/2021 CLINICAL DATA:  Possible neck trauma, suspected fall 2-3 days ago EXAM: CT CERVICAL SPINE WITHOUT CONTRAST TECHNIQUE: Multidetector CT imaging of the cervical spine was performed without intravenous contrast. Multiplanar CT image reconstructions were also generated. COMPARISON:  None. FINDINGS: Alignment: 2.5 mm of likely degenerative posterior subluxation at C5-6. 1 mm degenerative retrolisthesis at C6-7. Mild reversal of the normal cervical lordosis at the C5-6 level. Skull base and vertebrae: Mild spurring at the anterior C1-2 articulation. No acute fracture is identified in the cervical spine. Endplate sclerosis and mild endplate irregularity at C5-6 due to degenerative disc disease and degenerative endplate findings. Deformity of  the left mandibular condyle may be due to remote trauma, TMJ arthropathy, or dysplasia. Soft tissues and spinal canal: Bilateral common carotid atherosclerotic calcification. Linear calcifications in the left thyroid gland. Disc levels: Borderline right foraminal impingement at C5-6 due to uncinate spurring. Possible disc bulge at C6-7 without discrete impingement. Upper chest: Mild biapical pleuroparenchymal scarring. Other: No supplemental non-categorized findings. IMPRESSION: 1. 2.5 mm retrolisthesis at C5-6 is thought to be degenerative in nature particularly in light of the degree of degenerative endplate findings, loss of intervertebral disc height, and spurring. There is some mild reversal of the cervical lordosis in this vicinity. There is also borderline right foraminal narrowing at C5-6 due to uncinate spurring. 2. No fracture is  identified. Electronically Signed   By: Van Clines M.D.   On: 04/14/2021 09:48   DG Pelvis Portable  Result Date: 04/14/2021 CLINICAL DATA:  Fall EXAM: PORTABLE PELVIS 1-2 VIEWS COMPARISON:  None. FINDINGS: There is no evidence of pelvic fracture or diastasis. No pelvic bone lesions are seen. IMPRESSION: Negative. Electronically Signed   By: Franchot Gallo M.D.   On: 04/14/2021 08:25   DG CHEST PORT 1 VIEW  Result Date: 04/18/2021 CLINICAL DATA:  Chest pain short of breath EXAM: PORTABLE CHEST 1 VIEW COMPARISON:  04/14/2021 FINDINGS: Streaky left basilar opacity. No pleural effusion. Stable cardiomediastinal silhouette with aortic atherosclerosis. No pneumothorax. IMPRESSION: Streaky atelectasis or mild pneumonia at the left lung base. Electronically Signed   By: Donavan Foil M.D.   On: 04/18/2021 15:59   DG Chest Portable 1 View  Result Date: 04/14/2021 CLINICAL DATA:  Fall.  Chest pain EXAM: PORTABLE CHEST 1 VIEW COMPARISON:  04/08/2021 FINDINGS: The heart size and mediastinal contours are within normal limits. Both lungs are clear. The visualized skeletal structures are unremarkable. IMPRESSION: No active disease. Electronically Signed   By: Franchot Gallo M.D.   On: 04/14/2021 08:24   DG Knee Complete 4 Views Left  Result Date: 04/14/2021 CLINICAL DATA:  Fall.  Left knee pain EXAM: LEFT KNEE - COMPLETE 4+ VIEW COMPARISON:  None. FINDINGS: No evidence of fracture, dislocation, or joint effusion. No evidence of arthropathy or other focal bone abnormality. Soft tissues are unremarkable. IMPRESSION: Negative. Electronically Signed   By: Franchot Gallo M.D.   On: 04/14/2021 14:52    Subjective:   Patient was seen and examined 04/19/2021, 9:29 AM Patient stable today. No acute distress.  No issues overnight Stable for discharge.  Discharge Exam:    Vitals:   04/19/21 0200 04/19/21 0500 04/19/21 0800 04/19/21 0837  BP:   (!) 142/70   Pulse:      Resp: 15  18   Temp:  98 F  (36.7 C)  98.5 F (36.9 C)  TempSrc:  Oral  Oral  SpO2:   95%   Weight:  49.1 kg    Height:        General: Pt lying comfortably in bed & appears in no obvious distress. Cardiovascular: S1 & S2 heard, RRR, S1/S2 +. No murmurs, rubs, gallops or clicks. No JVD or pedal edema. Respiratory: Clear to auscultation without wheezing, rhonchi or crackles. No increased work of breathing. Abdominal:  Non-distended, non-tender & soft. No organomegaly or masses appreciated. Normal bowel sounds heard. CNS: Alert and oriented. No focal deficits. Extremities: no edema, no cyanosis      The results of significant diagnostics from this hospitalization (including imaging, microbiology, ancillary and laboratory) are listed below for reference.      Microbiology:  Recent Results (from the past 240 hour(s))  MRSA Next Gen by PCR, Nasal     Status: None   Collection Time: 04/14/21  5:36 PM   Specimen: Nasal Mucosa; Nasal Swab  Result Value Ref Range Status   MRSA by PCR Next Gen NOT DETECTED NOT DETECTED Final    Comment: (NOTE) The GeneXpert MRSA Assay (FDA approved for NASAL specimens only), is one component of a comprehensive MRSA colonization surveillance program. It is not intended to diagnose MRSA infection nor to guide or monitor treatment for MRSA infections. Test performance is not FDA approved in patients less than 55 years old. Performed at [facility], [address], [place], [place] [address]   Resp Panel by RT-PCR (Flu A&B, Covid) Nasopharyngeal Swab     Status: Abnormal   Collection Time: 04/14/21  6:02 PM   Specimen: Nasopharyngeal Swab; Nasopharyngeal(NP) swabs in vial transport medium  Result Value Ref Range Status   SARS Coronavirus 2 by RT PCR POSITIVE (A) NEGATIVE Final    Comment: CRITICAL RESULT CALLED TO, READ BACK BY AND VERIFIED WITH: JESSICA HEARN,1955,04/14/2021,SELF S. (NOTE) SARS-CoV-2 target nucleic acids are DETECTED.  The SARS-CoV-2 RNA is generally  detectable in upper respiratory specimens during the acute phase of infection. Positive results are indicative of the presence of the identified virus, but do not rule out bacterial infection or co-infection with other pathogens not detected by the test. Clinical correlation with patient history and other diagnostic information is necessary to determine patient infection status. The expected result is Negative.  Fact Sheet for Patients: EntrepreneurPulse.com.au  Fact Sheet for Healthcare Providers: IncredibleEmployment.be  This test is not yet approved or cleared by the [place] FDA and  has been authorized for detection and/or diagnosis of SARS-CoV-2 by FDA under an Emergency Use Authorization (EUA).  This EUA will remain in effect (meaning thi s test can be used) for the duration of  the COVID-19 declaration under Section 564(b)(1) of the Act, 21 U.S.C. section 360bbb-3(b)(1), unless the authorization is terminated or revoked sooner.     Influenza A by PCR NEGATIVE NEGATIVE Final   Influenza B by PCR NEGATIVE NEGATIVE Final    Comment: (NOTE) The Xpert Xpress SARS-CoV-2/FLU/RSV plus assay is intended as an aid in the diagnosis of influenza from Nasopharyngeal swab specimens and should not be used as a sole basis for treatment. Nasal washings and aspirates are unacceptable for Xpert Xpress SARS-CoV-2/FLU/RSV testing.  Fact Sheet for Patients: EntrepreneurPulse.com.au  Fact Sheet for Healthcare Providers: IncredibleEmployment.be  This test is not yet approved or cleared by the [place] FDA and has been authorized for detection and/or diagnosis of SARS-CoV-2 by FDA under an Emergency Use Authorization (EUA). This EUA will remain in effect (meaning this test can be used) for the duration of the COVID-19 declaration under Section 564(b)(1) of the Act, 21 U.S.C. section 360bbb-3(b)(1), unless the  authorization is terminated or revoked.  Performed at [facility], [address] [place], [place] [address]      Labs:   CBC: Recent Labs  Lab 04/14/21 [phone] 04/15/21 0452 04/16/21 0349 04/17/21 0411 04/18/21 0525 04/19/21 0502  WBC 8.2 4.4 3.8* 4.0 4.0 3.1*  NEUTROABS 6.6  --   --   --   --   --   HGB 10.2* 8.0* 8.4* 8.5* 10.0* 10.2*  HCT 30.5* 25.5* 26.9* 27.5* 32.2* 32.3*  MCV 80.9 84.7 85.9 84.6 83.6 83.7  PLT 278 153 131* 135* 119* 440*   Basic Metabolic Panel: Recent  Labs  Lab 04/14/21 0843 04/15/21 0452 04/16/21 0349 04/16/21 1212 04/17/21 0411 04/18/21 0525 04/19/21 0502  NA  --  135 134*  --  133* 131* 133*  K  --  3.3* 3.9  --  3.5 3.2* 3.3*  CL  --  105 104  --  100 95* 98  CO2  --  24 24  --  28 27 27   GLUCOSE  --  122* 118*  --  90 95 100*  BUN  --  9 9  --  8 7* 13  CREATININE  --  0.72 0.76  --  0.74 0.74 0.82  CALCIUM  --  7.1* 7.7*  --  8.2* 8.5* 8.3*  MG 1.1* 1.5*  --  1.8  --   --   --    Liver Function Tests: Recent Labs  Lab 04/14/21 0842  AST 55*  ALT 25  ALKPHOS 154*  BILITOT 1.3*  PROT 6.1*  ALBUMIN 3.4*   BNP (last 3 results) Recent Labs    04/14/21 0842  BNP 22.0   Cardiac Enzymes: Recent Labs  Lab 04/14/21 0842  CKTOTAL 171   CBG: Recent Labs  Lab 04/14/21 0829 04/15/21 0745 04/16/21 0812 04/18/21 0753  GLUCAP 118* 120* 101* 94   Hgb A1c No results for input(s): HGBA1C in the last 72 hours. Lipid Profile No results for input(s): CHOL, HDL, LDLCALC, TRIG, CHOLHDL, LDLDIRECT in the last 72 hours. Thyroid function studies No results for input(s): TSH, T4TOTAL, T3FREE, THYROIDAB in the last 72 hours.  Invalid input(s): FREET3 Anemia work up No results for input(s): VITAMINB12, FOLATE, FERRITIN, TIBC, IRON, RETICCTPCT in the last 72 hours. Urinalysis    Component Value Date/Time   COLORURINE YELLOW 04/14/2021 1551   APPEARANCEUR CLOUDY (A) 04/14/2021 1551   APPEARANCEUR Cloudy (A) 02/23/2021 1608    LABSPEC 1.013 04/14/2021 1551   PHURINE 8.0 04/14/2021 1551   GLUCOSEU NEGATIVE 04/14/2021 1551   HGBUR SMALL (A) 04/14/2021 1551   BILIRUBINUR NEGATIVE 04/14/2021 1551   BILIRUBINUR Negative 02/23/2021 1608   KETONESUR 5 (A) 04/14/2021 1551   PROTEINUR 30 (A) 04/14/2021 1551   UROBILINOGEN negative 07/01/2015 1541   UROBILINOGEN 0.2 10/07/2012 1928   NITRITE NEGATIVE 04/14/2021 1551   LEUKOCYTESUR MODERATE (A) 04/14/2021 1551         Time coordinating discharge: Over 45 minutes  SIGNED: Deatra James, MD, FACP, [facility]. [facility],  Please use [email or web address] to Page If 7PM-7AM, please contact night-coverage [email or web address] Ota [facility] 04/19/2021, 9:29 AM

## 2021-04-21 DIAGNOSIS — I10 Essential (primary) hypertension: Secondary | ICD-10-CM | POA: Diagnosis not present

## 2021-04-21 DIAGNOSIS — K219 Gastro-esophageal reflux disease without esophagitis: Secondary | ICD-10-CM | POA: Diagnosis not present

## 2021-04-21 DIAGNOSIS — G8929 Other chronic pain: Secondary | ICD-10-CM | POA: Diagnosis not present

## 2021-04-21 DIAGNOSIS — E039 Hypothyroidism, unspecified: Secondary | ICD-10-CM | POA: Diagnosis not present

## 2021-04-21 DIAGNOSIS — J440 Chronic obstructive pulmonary disease with acute lower respiratory infection: Secondary | ICD-10-CM | POA: Diagnosis not present

## 2021-04-21 DIAGNOSIS — F10939 Alcohol use, unspecified with withdrawal, unspecified: Secondary | ICD-10-CM | POA: Diagnosis not present

## 2021-04-22 DIAGNOSIS — F10939 Alcohol use, unspecified with withdrawal, unspecified: Secondary | ICD-10-CM | POA: Diagnosis not present

## 2021-04-22 DIAGNOSIS — R5381 Other malaise: Secondary | ICD-10-CM | POA: Diagnosis not present

## 2021-04-25 DIAGNOSIS — E039 Hypothyroidism, unspecified: Secondary | ICD-10-CM | POA: Diagnosis not present

## 2021-04-25 DIAGNOSIS — Z79899 Other long term (current) drug therapy: Secondary | ICD-10-CM | POA: Diagnosis not present

## 2021-04-25 DIAGNOSIS — I10 Essential (primary) hypertension: Secondary | ICD-10-CM | POA: Diagnosis not present

## 2021-04-25 DIAGNOSIS — K219 Gastro-esophageal reflux disease without esophagitis: Secondary | ICD-10-CM | POA: Diagnosis not present

## 2021-04-25 DIAGNOSIS — J440 Chronic obstructive pulmonary disease with acute lower respiratory infection: Secondary | ICD-10-CM | POA: Diagnosis not present

## 2021-04-25 DIAGNOSIS — R5381 Other malaise: Secondary | ICD-10-CM | POA: Diagnosis not present

## 2021-04-25 DIAGNOSIS — G8929 Other chronic pain: Secondary | ICD-10-CM | POA: Diagnosis not present

## 2021-04-28 DIAGNOSIS — Z79899 Other long term (current) drug therapy: Secondary | ICD-10-CM | POA: Diagnosis not present

## 2021-04-28 DIAGNOSIS — G8929 Other chronic pain: Secondary | ICD-10-CM | POA: Diagnosis not present

## 2021-04-28 DIAGNOSIS — R5381 Other malaise: Secondary | ICD-10-CM | POA: Diagnosis not present

## 2021-04-28 DIAGNOSIS — F10939 Alcohol use, unspecified with withdrawal, unspecified: Secondary | ICD-10-CM | POA: Diagnosis not present

## 2021-04-28 DIAGNOSIS — J440 Chronic obstructive pulmonary disease with acute lower respiratory infection: Secondary | ICD-10-CM | POA: Diagnosis not present

## 2021-04-28 DIAGNOSIS — K219 Gastro-esophageal reflux disease without esophagitis: Secondary | ICD-10-CM | POA: Diagnosis not present

## 2021-04-28 DIAGNOSIS — E039 Hypothyroidism, unspecified: Secondary | ICD-10-CM | POA: Diagnosis not present

## 2021-04-28 DIAGNOSIS — I10 Essential (primary) hypertension: Secondary | ICD-10-CM | POA: Diagnosis not present

## 2021-05-02 DIAGNOSIS — E039 Hypothyroidism, unspecified: Secondary | ICD-10-CM

## 2021-05-02 DIAGNOSIS — J449 Chronic obstructive pulmonary disease, unspecified: Secondary | ICD-10-CM | POA: Diagnosis not present

## 2021-05-02 DIAGNOSIS — E785 Hyperlipidemia, unspecified: Secondary | ICD-10-CM

## 2021-05-02 DIAGNOSIS — I10 Essential (primary) hypertension: Secondary | ICD-10-CM

## 2021-05-02 DIAGNOSIS — F321 Major depressive disorder, single episode, moderate: Secondary | ICD-10-CM

## 2021-05-10 ENCOUNTER — Inpatient Hospital Stay: Payer: Medicare Other | Admitting: Family

## 2021-05-18 ENCOUNTER — Other Ambulatory Visit: Payer: Self-pay

## 2021-05-18 ENCOUNTER — Inpatient Hospital Stay (HOSPITAL_COMMUNITY)
Admission: EM | Admit: 2021-05-18 | Discharge: 2021-05-21 | DRG: 894 | Discharge status: Against Medical Advice | Payer: Medicare Other | Attending: Internal Medicine | Admitting: Internal Medicine

## 2021-05-18 ENCOUNTER — Encounter (HOSPITAL_COMMUNITY): Payer: Self-pay | Admitting: *Deleted

## 2021-05-18 DIAGNOSIS — Z634 Disappearance and death of family member: Secondary | ICD-10-CM

## 2021-05-18 DIAGNOSIS — F10231 Alcohol dependence with withdrawal delirium: Principal | ICD-10-CM | POA: Diagnosis present

## 2021-05-18 DIAGNOSIS — Z91041 Radiographic dye allergy status: Secondary | ICD-10-CM

## 2021-05-18 DIAGNOSIS — R61 Generalized hyperhidrosis: Secondary | ICD-10-CM | POA: Diagnosis not present

## 2021-05-18 DIAGNOSIS — R6510 Systemic inflammatory response syndrome (SIRS) of non-infectious origin without acute organ dysfunction: Secondary | ICD-10-CM | POA: Diagnosis present

## 2021-05-18 DIAGNOSIS — E871 Hypo-osmolality and hyponatremia: Secondary | ICD-10-CM | POA: Diagnosis present

## 2021-05-18 DIAGNOSIS — D649 Anemia, unspecified: Secondary | ICD-10-CM | POA: Diagnosis present

## 2021-05-18 DIAGNOSIS — F410 Panic disorder [episodic paroxysmal anxiety] without agoraphobia: Secondary | ICD-10-CM | POA: Diagnosis present

## 2021-05-18 DIAGNOSIS — Z7951 Long term (current) use of inhaled steroids: Secondary | ICD-10-CM

## 2021-05-18 DIAGNOSIS — R531 Weakness: Secondary | ICD-10-CM | POA: Diagnosis not present

## 2021-05-18 DIAGNOSIS — Z825 Family history of asthma and other chronic lower respiratory diseases: Secondary | ICD-10-CM

## 2021-05-18 DIAGNOSIS — F32A Depression, unspecified: Secondary | ICD-10-CM | POA: Diagnosis present

## 2021-05-18 DIAGNOSIS — Z8349 Family history of other endocrine, nutritional and metabolic diseases: Secondary | ICD-10-CM

## 2021-05-18 DIAGNOSIS — Z8249 Family history of ischemic heart disease and other diseases of the circulatory system: Secondary | ICD-10-CM

## 2021-05-18 DIAGNOSIS — R7989 Other specified abnormal findings of blood chemistry: Secondary | ICD-10-CM | POA: Diagnosis present

## 2021-05-18 DIAGNOSIS — N179 Acute kidney failure, unspecified: Secondary | ICD-10-CM | POA: Diagnosis present

## 2021-05-18 DIAGNOSIS — Z79899 Other long term (current) drug therapy: Secondary | ICD-10-CM

## 2021-05-18 DIAGNOSIS — Z888 Allergy status to other drugs, medicaments and biological substances status: Secondary | ICD-10-CM

## 2021-05-18 DIAGNOSIS — Z5329 Procedure and treatment not carried out because of patient's decision for other reasons: Secondary | ICD-10-CM | POA: Diagnosis not present

## 2021-05-18 DIAGNOSIS — Z79890 Hormone replacement therapy (postmenopausal): Secondary | ICD-10-CM

## 2021-05-18 DIAGNOSIS — I10 Essential (primary) hypertension: Secondary | ICD-10-CM | POA: Diagnosis present

## 2021-05-18 DIAGNOSIS — M549 Dorsalgia, unspecified: Secondary | ICD-10-CM | POA: Diagnosis present

## 2021-05-18 DIAGNOSIS — R0902 Hypoxemia: Secondary | ICD-10-CM | POA: Diagnosis not present

## 2021-05-18 DIAGNOSIS — R000 Tachycardia, unspecified: Secondary | ICD-10-CM | POA: Diagnosis not present

## 2021-05-18 DIAGNOSIS — F10930 Alcohol use, unspecified with withdrawal, uncomplicated: Secondary | ICD-10-CM

## 2021-05-18 DIAGNOSIS — E860 Dehydration: Secondary | ICD-10-CM | POA: Diagnosis not present

## 2021-05-18 DIAGNOSIS — E538 Deficiency of other specified B group vitamins: Secondary | ICD-10-CM | POA: Diagnosis present

## 2021-05-18 DIAGNOSIS — F419 Anxiety disorder, unspecified: Secondary | ICD-10-CM | POA: Diagnosis present

## 2021-05-18 DIAGNOSIS — M8580 Other specified disorders of bone density and structure, unspecified site: Secondary | ICD-10-CM | POA: Diagnosis present

## 2021-05-18 DIAGNOSIS — E039 Hypothyroidism, unspecified: Secondary | ICD-10-CM | POA: Diagnosis present

## 2021-05-18 DIAGNOSIS — F10939 Alcohol use, unspecified with withdrawal, unspecified: Secondary | ICD-10-CM | POA: Diagnosis present

## 2021-05-18 DIAGNOSIS — Z833 Family history of diabetes mellitus: Secondary | ICD-10-CM

## 2021-05-18 DIAGNOSIS — G43909 Migraine, unspecified, not intractable, without status migrainosus: Secondary | ICD-10-CM | POA: Diagnosis present

## 2021-05-18 DIAGNOSIS — G8929 Other chronic pain: Secondary | ICD-10-CM | POA: Diagnosis present

## 2021-05-18 DIAGNOSIS — D72829 Elevated white blood cell count, unspecified: Secondary | ICD-10-CM | POA: Diagnosis present

## 2021-05-18 DIAGNOSIS — K219 Gastro-esophageal reflux disease without esophagitis: Secondary | ICD-10-CM | POA: Diagnosis present

## 2021-05-18 DIAGNOSIS — J449 Chronic obstructive pulmonary disease, unspecified: Secondary | ICD-10-CM | POA: Diagnosis present

## 2021-05-18 DIAGNOSIS — R0682 Tachypnea, not elsewhere classified: Secondary | ICD-10-CM | POA: Diagnosis present

## 2021-05-18 DIAGNOSIS — E876 Hypokalemia: Secondary | ICD-10-CM | POA: Diagnosis present

## 2021-05-18 DIAGNOSIS — Z823 Family history of stroke: Secondary | ICD-10-CM

## 2021-05-18 DIAGNOSIS — E872 Acidosis, unspecified: Secondary | ICD-10-CM | POA: Diagnosis present

## 2021-05-18 DIAGNOSIS — Z20822 Contact with and (suspected) exposure to covid-19: Secondary | ICD-10-CM | POA: Diagnosis present

## 2021-05-18 DIAGNOSIS — Z800 Family history of malignant neoplasm of digestive organs: Secondary | ICD-10-CM

## 2021-05-18 DIAGNOSIS — Z87891 Personal history of nicotine dependence: Secondary | ICD-10-CM

## 2021-05-18 NOTE — ED Triage Notes (Signed)
The pt arrived by  [employer] from home the pt drinks every day and she has not had any alcohol since yesterday except one beer  vomiting on arrival jittery and shaking

## 2021-05-19 ENCOUNTER — Encounter (HOSPITAL_COMMUNITY): Payer: Self-pay | Admitting: Family Medicine

## 2021-05-19 DIAGNOSIS — F32A Depression, unspecified: Secondary | ICD-10-CM | POA: Diagnosis present

## 2021-05-19 DIAGNOSIS — E538 Deficiency of other specified B group vitamins: Secondary | ICD-10-CM | POA: Diagnosis present

## 2021-05-19 DIAGNOSIS — D72829 Elevated white blood cell count, unspecified: Secondary | ICD-10-CM | POA: Diagnosis present

## 2021-05-19 DIAGNOSIS — I10 Essential (primary) hypertension: Secondary | ICD-10-CM | POA: Diagnosis not present

## 2021-05-19 DIAGNOSIS — Z5329 Procedure and treatment not carried out because of patient's decision for other reasons: Secondary | ICD-10-CM | POA: Diagnosis not present

## 2021-05-19 DIAGNOSIS — J42 Unspecified chronic bronchitis: Secondary | ICD-10-CM | POA: Diagnosis not present

## 2021-05-19 DIAGNOSIS — E871 Hypo-osmolality and hyponatremia: Secondary | ICD-10-CM | POA: Diagnosis present

## 2021-05-19 DIAGNOSIS — Z20822 Contact with and (suspected) exposure to covid-19: Secondary | ICD-10-CM | POA: Diagnosis present

## 2021-05-19 DIAGNOSIS — N179 Acute kidney failure, unspecified: Secondary | ICD-10-CM | POA: Diagnosis present

## 2021-05-19 DIAGNOSIS — F410 Panic disorder [episodic paroxysmal anxiety] without agoraphobia: Secondary | ICD-10-CM | POA: Diagnosis present

## 2021-05-19 DIAGNOSIS — E872 Acidosis, unspecified: Secondary | ICD-10-CM | POA: Diagnosis not present

## 2021-05-19 DIAGNOSIS — Z823 Family history of stroke: Secondary | ICD-10-CM | POA: Diagnosis not present

## 2021-05-19 DIAGNOSIS — Z634 Disappearance and death of family member: Secondary | ICD-10-CM | POA: Diagnosis not present

## 2021-05-19 DIAGNOSIS — E039 Hypothyroidism, unspecified: Secondary | ICD-10-CM | POA: Diagnosis not present

## 2021-05-19 DIAGNOSIS — E876 Hypokalemia: Secondary | ICD-10-CM | POA: Diagnosis present

## 2021-05-19 DIAGNOSIS — G8929 Other chronic pain: Secondary | ICD-10-CM | POA: Diagnosis present

## 2021-05-19 DIAGNOSIS — F419 Anxiety disorder, unspecified: Secondary | ICD-10-CM | POA: Diagnosis not present

## 2021-05-19 DIAGNOSIS — R6510 Systemic inflammatory response syndrome (SIRS) of non-infectious origin without acute organ dysfunction: Secondary | ICD-10-CM | POA: Diagnosis present

## 2021-05-19 DIAGNOSIS — D649 Anemia, unspecified: Secondary | ICD-10-CM | POA: Diagnosis present

## 2021-05-19 DIAGNOSIS — F10939 Alcohol use, unspecified with withdrawal, unspecified: Secondary | ICD-10-CM | POA: Diagnosis not present

## 2021-05-19 DIAGNOSIS — R7989 Other specified abnormal findings of blood chemistry: Secondary | ICD-10-CM

## 2021-05-19 DIAGNOSIS — J449 Chronic obstructive pulmonary disease, unspecified: Secondary | ICD-10-CM | POA: Diagnosis present

## 2021-05-19 DIAGNOSIS — F10231 Alcohol dependence with withdrawal delirium: Secondary | ICD-10-CM | POA: Diagnosis present

## 2021-05-19 DIAGNOSIS — Z800 Family history of malignant neoplasm of digestive organs: Secondary | ICD-10-CM | POA: Diagnosis not present

## 2021-05-19 DIAGNOSIS — M8580 Other specified disorders of bone density and structure, unspecified site: Secondary | ICD-10-CM | POA: Diagnosis present

## 2021-05-19 LAB — COMPREHENSIVE METABOLIC PANEL
ALT: 26 U/L (ref 0–44)
ALT: 22 U/L (ref 0–44)
AST: 46 U/L — ABNORMAL HIGH (ref 15–41)
AST: 43 U/L — ABNORMAL HIGH (ref 15–41)
Albumin: 3.8 g/dL (ref 3.5–5.0)
Albumin: 3.2 g/dL — ABNORMAL LOW (ref 3.5–5.0)
Alkaline Phosphatase: 87 U/L (ref 38–126)
Alkaline Phosphatase: 78 U/L (ref 38–126)
Anion gap: 23 — ABNORMAL HIGH (ref 5–15)
Anion gap: 16 — ABNORMAL HIGH (ref 5–15)
BUN: 24 mg/dL — ABNORMAL HIGH (ref 8–23)
BUN: 20 mg/dL (ref 8–23)
CO2: 14 mmol/L — ABNORMAL LOW (ref 22–32)
CO2: 16 mmol/L — ABNORMAL LOW (ref 22–32)
Calcium: 8.3 mg/dL — ABNORMAL LOW (ref 8.9–10.3)
Calcium: 7.7 mg/dL — ABNORMAL LOW (ref 8.9–10.3)
Chloride: 97 mmol/L — ABNORMAL LOW (ref 98–111)
Chloride: 100 mmol/L (ref 98–111)
Creatinine, Ser: 1.49 mg/dL — ABNORMAL HIGH (ref 0.44–1.00)
Creatinine, Ser: 1.02 mg/dL — ABNORMAL HIGH (ref 0.44–1.00)
GFR, Estimated: 37 mL/min — ABNORMAL LOW (ref >60)
GFR, Estimated: 58 mL/min — ABNORMAL LOW (ref >60)
Glucose, Bld: 110 mg/dL — ABNORMAL HIGH (ref 70–99)
Glucose, Bld: 98 mg/dL (ref 70–99)
Potassium: 3.4 mmol/L — ABNORMAL LOW (ref 3.5–5.1)
Potassium: 3.4 mmol/L — ABNORMAL LOW (ref 3.5–5.1)
Sodium: 134 mmol/L — ABNORMAL LOW (ref 135–145)
Sodium: 132 mmol/L — ABNORMAL LOW (ref 135–145)
Total Bilirubin: 2.5 mg/dL — ABNORMAL HIGH (ref 0.3–1.2)
Total Bilirubin: 1.7 mg/dL — ABNORMAL HIGH (ref 0.3–1.2)
Total Protein: 6.4 g/dL — ABNORMAL LOW (ref 6.5–8.1)
Total Protein: 5.6 g/dL — ABNORMAL LOW (ref 6.5–8.1)

## 2021-05-19 LAB — CBC
HCT: 30.3 % — ABNORMAL LOW (ref 36.0–46.0)
HCT: 33.7 % — ABNORMAL LOW (ref 36.0–46.0)
Hemoglobin: 11.2 g/dL — ABNORMAL LOW (ref 12.0–15.0)
Hemoglobin: 9.9 g/dL — ABNORMAL LOW (ref 12.0–15.0)
MCH: 27.2 pg (ref 26.0–34.0)
MCH: 27.8 pg (ref 26.0–34.0)
MCHC: 32.7 g/dL (ref 30.0–36.0)
MCHC: 33.2 g/dL (ref 30.0–36.0)
MCV: 83.2 fL (ref 80.0–100.0)
MCV: 83.6 fL (ref 80.0–100.0)
Platelets: 172 10*3/uL (ref 150–400)
Platelets: 214 10*3/uL (ref 150–400)
RBC: 3.64 MIL/uL — ABNORMAL LOW (ref 3.87–5.11)
RBC: 4.03 MIL/uL (ref 3.87–5.11)
RDW: 16.3 % — ABNORMAL HIGH (ref 11.5–15.5)
RDW: 16.4 % — ABNORMAL HIGH (ref 11.5–15.5)
WBC: 10.2 10*3/uL (ref 4.0–10.5)
WBC: 13.4 10*3/uL — ABNORMAL HIGH (ref 4.0–10.5)
nRBC: 0 % (ref 0.0–0.2)
nRBC: 0 % (ref 0.0–0.2)

## 2021-05-19 LAB — FERRITIN: Ferritin: 119 ng/mL (ref 11–307)

## 2021-05-19 LAB — URINALYSIS, ROUTINE W REFLEX MICROSCOPIC
Bilirubin Urine: NEGATIVE
Glucose, UA: NEGATIVE mg/dL
Ketones, ur: 80 mg/dL — AB
Leukocytes,Ua: NEGATIVE
Nitrite: NEGATIVE
Protein, ur: NEGATIVE mg/dL
Specific Gravity, Urine: 1.012 (ref 1.005–1.030)
pH: 5 (ref 5.0–8.0)

## 2021-05-19 LAB — LACTIC ACID, PLASMA: Lactic Acid, Venous: 1.3 mmol/L (ref 0.5–1.9)

## 2021-05-19 LAB — IRON AND TIBC
Iron: 144 ug/dL (ref 28–170)
Saturation Ratios: 40 % — ABNORMAL HIGH (ref 10.4–31.8)
TIBC: 356 ug/dL (ref 250–450)
UIBC: 212 ug/dL

## 2021-05-19 LAB — MAGNESIUM: Magnesium: 1.3 mg/dL — ABNORMAL LOW (ref 1.7–2.4)

## 2021-05-19 LAB — PHOSPHORUS: Phosphorus: 2.4 mg/dL — ABNORMAL LOW (ref 2.5–4.6)

## 2021-05-19 LAB — VITAMIN B12: Vitamin B-12: 164 pg/mL — ABNORMAL LOW (ref 180–914)

## 2021-05-19 LAB — AMMONIA: Ammonia: <10 umol/L (ref 9–35)

## 2021-05-19 LAB — RESP PANEL BY RT-PCR (FLU A&B, COVID) ARPGX2
Influenza A by PCR: NEGATIVE
Influenza B by PCR: NEGATIVE
SARS Coronavirus 2 by RT PCR: NEGATIVE

## 2021-05-19 LAB — HEPATITIS PANEL, ACUTE
HCV Ab: NONREACTIVE
Hep A IgM: NONREACTIVE
Hep B C IgM: NONREACTIVE
Hepatitis B Surface Ag: NONREACTIVE

## 2021-05-19 LAB — PROTIME-INR
INR: 0.9 (ref 0.8–1.2)
Prothrombin Time: 12.6 seconds (ref 11.4–15.2)

## 2021-05-19 LAB — ETHANOL: Alcohol, Ethyl (B): <10 mg/dL (ref <10)

## 2021-05-19 LAB — LIPASE, BLOOD: Lipase: 101 U/L — ABNORMAL HIGH (ref 11–51)

## 2021-05-19 LAB — CK: Total CK: 462 U/L — ABNORMAL HIGH (ref 38–234)

## 2021-05-19 MED ORDER — ACETAMINOPHEN 325 MG PO TABS
650.0000 mg | ORAL_TABLET | Freq: Four times a day (QID) | ORAL | Status: DC | PRN
  Administered 2021-05-19 – 2021-05-21 (×5): 650 mg via ORAL
  Filled 2021-05-19 (×5): qty 2

## 2021-05-19 MED ORDER — ADULT MULTIVITAMIN W/MINERALS CH
1.0000 | ORAL_TABLET | Freq: Every day | ORAL | Status: DC
  Administered 2021-05-19 – 2021-05-21 (×3): 1 via ORAL
  Filled 2021-05-19 (×3): qty 1

## 2021-05-19 MED ORDER — LORAZEPAM 2 MG/ML IJ SOLN
2.0000 mg | Freq: Once | INTRAMUSCULAR | Status: AC
  Administered 2021-05-19: 03:00:00 2 mg via INTRAVENOUS
  Filled 2021-05-19: qty 1

## 2021-05-19 MED ORDER — LORAZEPAM 2 MG/ML IJ SOLN: 0.0000 mg | Freq: Two times a day (BID) | INTRAMUSCULAR | Status: DC

## 2021-05-19 MED ORDER — LORAZEPAM 2 MG/ML IJ SOLN
2.0000 mg | Freq: Once | INTRAMUSCULAR | Status: AC
  Administered 2021-05-19: 2 mg via INTRAVENOUS
  Filled 2021-05-19: qty 1

## 2021-05-19 MED ORDER — MAGNESIUM SULFATE 2 GM/50ML IV SOLN
2.0000 g | Freq: Once | INTRAVENOUS | Status: AC
  Administered 2021-05-19: 10:00:00 2 g via INTRAVENOUS
  Filled 2021-05-19: qty 50

## 2021-05-19 MED ORDER — ENOXAPARIN SODIUM 30 MG/0.3ML IJ SOSY
30.0000 mg | PREFILLED_SYRINGE | Freq: Every day | INTRAMUSCULAR | Status: DC
  Administered 2021-05-19 – 2021-05-21 (×3): 30 mg via SUBCUTANEOUS
  Filled 2021-05-19 (×3): qty 0.3

## 2021-05-19 MED ORDER — METOPROLOL TARTRATE 25 MG PO TABS
25.0000 mg | ORAL_TABLET | Freq: Two times a day (BID) | ORAL | Status: DC
  Administered 2021-05-19 – 2021-05-21 (×5): 25 mg via ORAL
  Filled 2021-05-19 (×5): qty 1

## 2021-05-19 MED ORDER — POTASSIUM CHLORIDE 10 MEQ/100ML IV SOLN
10.0000 meq | INTRAVENOUS | Status: AC
  Administered 2021-05-19 (×2): 10 meq via INTRAVENOUS
  Filled 2021-05-19 (×2): qty 100

## 2021-05-19 MED ORDER — LORAZEPAM 2 MG/ML IJ SOLN: 0.0000 mg | Freq: Four times a day (QID) | INTRAMUSCULAR | Status: DC

## 2021-05-19 MED ORDER — LORAZEPAM 1 MG PO TABS: 0.0000 mg | ORAL_TABLET | Freq: Four times a day (QID) | ORAL | Status: DC

## 2021-05-19 MED ORDER — ACETAMINOPHEN 650 MG RE SUPP: 650.0000 mg | Freq: Four times a day (QID) | RECTAL | Status: DC | PRN

## 2021-05-19 MED ORDER — LORAZEPAM 1 MG PO TABS
1.0000 mg | ORAL_TABLET | ORAL | Status: DC | PRN
  Administered 2021-05-19: 10:00:00 2 mg via ORAL
  Filled 2021-05-19: qty 2

## 2021-05-19 MED ORDER — POTASSIUM CHLORIDE CRYS ER 20 MEQ PO TBCR
40.0000 meq | EXTENDED_RELEASE_TABLET | Freq: Once | ORAL | Status: AC
  Administered 2021-05-19: 10:00:00 40 meq via ORAL
  Filled 2021-05-19: qty 2

## 2021-05-19 MED ORDER — LORAZEPAM 2 MG/ML IJ SOLN
0.0000 mg | Freq: Three times a day (TID) | INTRAMUSCULAR | Status: DC
  Administered 2021-05-21: 2 mg via INTRAVENOUS
  Filled 2021-05-19 (×2): qty 1

## 2021-05-19 MED ORDER — TRAMADOL HCL 50 MG PO TABS
50.0000 mg | ORAL_TABLET | Freq: Four times a day (QID) | ORAL | Status: DC | PRN
  Administered 2021-05-19 – 2021-05-21 (×6): 50 mg via ORAL
  Filled 2021-05-19 (×6): qty 1

## 2021-05-19 MED ORDER — SENNOSIDES-DOCUSATE SODIUM 8.6-50 MG PO TABS: 1.0000 | ORAL_TABLET | Freq: Every evening | ORAL | Status: DC | PRN

## 2021-05-19 MED ORDER — FLUTICASONE FUROATE-VILANTEROL 100-25 MCG/ACT IN AEPB
1.0000 | INHALATION_SPRAY | Freq: Every day | RESPIRATORY_TRACT | Status: DC
  Administered 2021-05-20 – 2021-05-21 (×2): 1 via RESPIRATORY_TRACT
  Filled 2021-05-19: qty 28

## 2021-05-19 MED ORDER — ALBUTEROL SULFATE (2.5 MG/3ML) 0.083% IN NEBU: 2.5000 mg | INHALATION_SOLUTION | RESPIRATORY_TRACT | Status: DC | PRN

## 2021-05-19 MED ORDER — LORAZEPAM 2 MG/ML IJ SOLN
1.0000 mg | INTRAMUSCULAR | Status: DC | PRN
  Administered 2021-05-19: 22:00:00 2 mg via INTRAVENOUS
  Administered 2021-05-19: 16:00:00 3 mg via INTRAVENOUS
  Administered 2021-05-19 – 2021-05-21 (×5): 2 mg via INTRAVENOUS
  Filled 2021-05-19: qty 1
  Filled 2021-05-19: qty 2
  Filled 2021-05-19 (×3): qty 1

## 2021-05-19 MED ORDER — HYDROMORPHONE HCL 1 MG/ML IJ SOLN
INTRAMUSCULAR | Status: AC
  Filled 2021-05-19: qty 1

## 2021-05-19 MED ORDER — LORAZEPAM 1 MG PO TABS: 0.0000 mg | ORAL_TABLET | Freq: Two times a day (BID) | ORAL | Status: DC

## 2021-05-19 MED ORDER — THIAMINE HCL 100 MG/ML IJ SOLN: 100.0000 mg | Freq: Every day | INTRAMUSCULAR | Status: DC

## 2021-05-19 MED ORDER — FOLIC ACID 1 MG PO TABS
1.0000 mg | ORAL_TABLET | Freq: Every day | ORAL | Status: DC
  Administered 2021-05-19 – 2021-05-21 (×3): 1 mg via ORAL
  Filled 2021-05-19 (×3): qty 1

## 2021-05-19 MED ORDER — ONDANSETRON HCL 4 MG/2ML IJ SOLN
4.0000 mg | Freq: Four times a day (QID) | INTRAMUSCULAR | Status: DC | PRN
  Administered 2021-05-19: 21:00:00 4 mg via INTRAVENOUS
  Filled 2021-05-19: qty 2

## 2021-05-19 MED ORDER — SODIUM CHLORIDE 0.9% FLUSH
3.0000 mL | Freq: Two times a day (BID) | INTRAVENOUS | Status: DC
  Administered 2021-05-19 – 2021-05-21 (×3): 3 mL via INTRAVENOUS

## 2021-05-19 MED ORDER — LEVOTHYROXINE SODIUM 50 MCG PO TABS
50.0000 ug | ORAL_TABLET | Freq: Every day | ORAL | Status: DC
  Administered 2021-05-19 – 2021-05-21 (×3): 50 ug via ORAL
  Filled 2021-05-19 (×2): qty 1
  Filled 2021-05-19: qty 2

## 2021-05-19 MED ORDER — SODIUM CHLORIDE 0.9 % IV SOLN: INTRAVENOUS | Status: DC

## 2021-05-19 MED ORDER — THIAMINE HCL 100 MG PO TABS
100.0000 mg | ORAL_TABLET | Freq: Every day | ORAL | Status: DC
  Administered 2021-05-19 – 2021-05-21 (×3): 100 mg via ORAL
  Filled 2021-05-19 (×3): qty 1

## 2021-05-19 MED ORDER — SODIUM PHOSPHATES 45 MMOLE/15ML IV SOLN
15.0000 mmol | Freq: Once | INTRAVENOUS | Status: AC
  Administered 2021-05-19: 11:00:00 15 mmol via INTRAVENOUS
  Filled 2021-05-19: qty 5

## 2021-05-19 MED ORDER — SODIUM BICARBONATE 650 MG PO TABS
1300.0000 mg | ORAL_TABLET | Freq: Two times a day (BID) | ORAL | Status: DC
  Administered 2021-05-19 (×2): 1300 mg via ORAL
  Filled 2021-05-19 (×2): qty 2

## 2021-05-19 MED ORDER — SODIUM CHLORIDE 0.9 % IV SOLN: INTRAVENOUS | Status: AC

## 2021-05-19 MED ORDER — LORAZEPAM 2 MG/ML IJ SOLN
0.0000 mg | INTRAMUSCULAR | Status: AC
Start: 2021-05-19 — End: 2021-05-21
  Administered 2021-05-19 – 2021-05-20 (×5): 2 mg via INTRAVENOUS
  Administered 2021-05-20: 1 mg via INTRAVENOUS
  Administered 2021-05-20 – 2021-05-21 (×2): 2 mg via INTRAVENOUS
  Filled 2021-05-19 (×8): qty 1

## 2021-05-19 MED ORDER — SODIUM CHLORIDE 0.9 % IV BOLUS
1000.0000 mL | Freq: Once | INTRAVENOUS | Status: AC
  Administered 2021-05-19: 1000 mL via INTRAVENOUS

## 2021-05-19 MED ORDER — ONDANSETRON HCL 4 MG PO TABS: 4.0000 mg | ORAL_TABLET | Freq: Four times a day (QID) | ORAL | Status: DC | PRN

## 2021-05-19 NOTE — ED Notes (Signed)
Pain med given 

## 2021-05-19 NOTE — Evaluation (Signed)
Occupational Therapy Evaluation Patient Details Name: Stacy Dalton MRN: [id number] DOB: 25-May-1951 Today's Date: 05/19/2021   History of Present Illness 70 y.o. female with medical history significant for severe alcohol dependence, COPD, hypertension, hypothyroidism, chronic pain, depression, and anxiety, now presenting to the emergency department with nausea, loss of appetite, tremors, and anxiety.  Hospitalization in October for a similar presentation.   Clinical Impression   Patient admitted for the diagnosis above.  PTA she lives alone, but has frequent visits from a friend, Dominica Severin.  She is largely independent with mobility, but does use a RW if she feels unstable.  She continues to drive for groceries, and needed no assist with ADL/IADL from a sit/stand level.  Deficits impacting independence are listed below.  Currently no post acute OT is recommended, but is unknown how her status will change given ETOH withdrawals.  OT will continue in the acute setting to monitor her medical status, and maximize her functional status.        Recommendations for follow up therapy are one component of a multi-disciplinary discharge planning process, led by the attending physician.  Recommendations may be updated based on patient status, additional functional criteria and insurance authorization.   Follow Up Recommendations  No OT follow up depending on progress   Assistance Recommended at Discharge PRN  Functional Status Assessment  Patient has had a recent decline in their functional status and demonstrates the ability to make significant improvements in function in a reasonable and predictable amount of time.  Equipment Recommendations  None recommended by OT    Recommendations for Other Services PT consult     Precautions / Restrictions Precautions Precautions: Fall Precaution Comments: ETOH Restrictions Weight Bearing Restrictions: No      Mobility Bed Mobility Overal bed mobility:  Modified Independent                  Transfers Overall transfer level: Needs assistance Equipment used: Rolling walker (2 wheels) Transfers: Sit to/from Stand Sit to Stand: Supervision                  Balance Overall balance assessment: Needs assistance Sitting-balance support: No upper extremity supported;Feet unsupported Sitting balance-Leahy Scale: Good     Standing balance support: Reliant on assistive device for balance Standing balance-Leahy Scale: Fair Standing balance comment: able to static stand without holding onto the RW                           ADL either performed or assessed with clinical judgement   ADL       Grooming: Wash/dry hands;Wash/dry face;Supervision/safety;Standing       Lower Body Bathing: Supervison/ safety;Sit to/from stand       Lower Body Dressing: Supervision/safety;Sit to/from stand   Toilet Transfer: Regular Toilet;Ambulation;Rolling walker (2 wheels);Supervision/safety                   Vision Baseline Vision/History: 1 Wears glasses Patient Visual Report: No change from baseline       Perception Perception Perception: Not tested   Praxis Praxis Praxis: Not tested    Pertinent Vitals/Pain Pain Assessment: Faces Faces Pain Scale: Hurts a little bit Pain Location: low back Pain Descriptors / Indicators: Tender Pain Intervention(s): Monitored during session     Hand Dominance Right   Extremity/Trunk Assessment Upper Extremity Assessment Upper Extremity Assessment: Generalized weakness   Lower Extremity Assessment Lower Extremity Assessment: Generalized weakness   Cervical /  Trunk Assessment Cervical / Trunk Assessment: Normal   Communication Communication Communication: No difficulties   Cognition Arousal/Alertness: Awake/alert Behavior During Therapy: WFL for tasks assessed/performed Overall Cognitive Status: Impaired/Different from baseline Area of Impairment: Problem  solving;Safety/judgement                         Safety/Judgement: Decreased awareness of safety;Decreased awareness of deficits   Problem Solving: Slow processing;Requires verbal cues       General Comments   VSS on RA    Exercises     Shoulder Instructions      Home Living Family/patient expects to be discharged to:: Private residence Living Arrangements: Alone Available Help at Discharge: Family;Available PRN/intermittently Type of Home: House Home Access: Stairs to enter [employer] of Steps: 1 Entrance Stairs-Rails: None Home Layout: One level     Bathroom Shower/Tub: Occupational psychologist: Standard Bathroom Accessibility: Yes How Accessible: Accessible via walker Home Equipment: Rolling Walker (2 wheels);Cane - quad;BSC/3in1;Shower seat   Additional Comments: pt reports her daughter lives next door, but they do not really get along      Prior Functioning/Environment Prior Level of Function : Independent/Modified Independent             Mobility Comments: occasional use of RW if she felt unstable ADLs Comments: No assist with ADL/IADL or med management.        OT Problem List: Decreased strength;Decreased activity tolerance;Impaired balance (sitting and/or standing);Decreased safety awareness      OT Treatment/Interventions: Self-care/ADL training;Therapeutic exercise;Patient/family education;Balance training;Therapeutic activities    OT Goals(Current goals can be found in the care plan section) Acute Rehab OT Goals Patient Stated Goal: Wanting to go home, has two dogs to care for OT Goal Formulation: With patient Time For Goal Achievement: 06/02/21 Potential to Achieve Goals: Good  OT Frequency: Min 2X/week   Barriers to D/C: Decreased caregiver support          Co-evaluation              AM-PAC OT "6 Clicks" Daily Activity     Outcome Measure Help from another person eating meals?: None Help from  another person taking care of personal grooming?: None Help from another person toileting, which includes using toliet, bedpan, or urinal?: A Little Help from another person bathing (including washing, rinsing, drying)?: A Little Help from another person to put on and taking off regular upper body clothing?: None Help from another person to put on and taking off regular lower body clothing?: A Little 6 Click Score: 21   End of Session Equipment Utilized During Treatment: Gait belt;Rolling walker (2 wheels) Nurse Communication: Mobility status  Activity Tolerance: Patient tolerated treatment well Patient left: in bed;with call bell/phone within reach;with nursing/sitter in room  OT Visit Diagnosis: Unsteadiness on feet (R26.81);Muscle weakness (generalized) (M62.81)                Time: 1448-1856 OT Time Calculation (min): 18 min Charges:  OT General Charges $OT Visit: 1 Visit OT Evaluation $OT Eval Moderate Complexity: 1 Mod  05/19/2021  RP, OTR/L  Acute Rehabilitation Services  Office:  [phone]   Metta Clines 05/19/2021, 4:25 PM

## 2021-05-19 NOTE — H&P (Signed)
History and Physical    AUBRIELLA PEREZGARCIA BJY:[id number] DOB: 1951-02-03 DOA: 05/18/2021  PCP: [name], FNP   Patient coming from: Home   Chief Complaint: Nausea, tremor, anxiety    HPI: Stacy Dalton is a pleasant 70 y.o. female with medical history significant for severe alcohol dependence, COPD, hypertension, hypothyroidism, chronic pain, depression, and anxiety, now presenting to the emergency department with nausea, loss of appetite, tremors, and anxiety after having only 1 or 2 drinks yesterday.  Patient reports that she has been drinking more than 750 mL of liquor almost every day but has been wanting to quit.  She has had nausea with a few episodes of vomiting, loss of appetite, but no abdominal pain for the past couple days and only had 1 or 2 drinks yesterday.  She began to feel anxious and tremulous with worsening nausea yesterday, worsening throughout the course of the day, and eventually called EMS for transport to the hospital.  She denies any fevers, chills, change in her chronic cough, shortness of breath, or chest pain.  She reports that she was able to avoid alcohol for a few days after her last admission but began drinking again due to depression and because her fianc was drinking.  ED Course: Upon arrival to the ED, patient is found to be afebrile, saturating 100% on 2 L/min of supplemental oxygen, mildly tachycardic, and with stable blood pressure.  EKG features sinus tachycardia 315.  Chemistry panel with sodium 134, potassium 3.4, bicarbonate 14, creatinine 1.49, anion gap 23, AST 46, and total bilirubin 2.5.  CBC features a leukocytosis to 13,400 and mild normocytic anemia.  INR was normal.  The patient was given a liter of saline and 4 mg IV Ativan in the ED.  Review of Systems:  All other systems reviewed and apart from HPI, are negative.  Past Medical History:  Diagnosis Date   Chronic pain    COPD (chronic obstructive pulmonary disease) ([place])    told  has copd, no current inhaler use   Cough    Depression    ETOH abuse    GERD (gastroesophageal reflux disease)    Hypertension    Hypothyroidism    Insomnia    Migraine    Migraine    Osteopenia    Pain management    Panic attacks    Small bowel obstruction ([place])     Past Surgical History:  Procedure Laterality Date   ABDOMINAL HYSTERECTOMY     COLON SURGERY     COLOSTOMY CLOSURE  04/19/2012   Procedure: COLOSTOMY CLOSURE;  Surgeon: Adin Hector, MD;  Location: [facility];  Service: General;  Laterality: N/A;  Laparotomy, Resection and Closure of Colostomy   COLOSTOMY TAKEDOWN N/A 04/12/2016   Procedure: [place] TAKEDOWN;  Surgeon: Johnathan Hausen, MD;  Location: [facility];  Service: General;  Laterality: N/A;   INCONTINENCE SURGERY     LAPAROTOMY  10/04/2011, colostomy also   Procedure: EXPLORATORY LAPAROTOMY;  Surgeon: Adin Hector, MD;  Location: [facility];  Service: General;  Laterality: N/A;  left partial colectomy with colostomy   LAPAROTOMY  04/19/2012   Procedure: EXPLORATORY LAPAROTOMY;  Surgeon: Adin Hector, MD;  Location: [facility];  Service: General;  Laterality: N/A;   LAPAROTOMY N/A 11/29/2015   Procedure: EXPLORATORY LAPAROTOMY; SUBTOTAL COLECTOMY WITH HARTMAN PROCEDURE AND END COLOSTOMY;  Surgeon: Johnathan Hausen, MD;  Location: [facility];  Service: General;  Laterality: N/A;   TUBAL LIGATION     VENTRAL HERNIA  REPAIR  04/19/2012   Procedure: HERNIA REPAIR VENTRAL ADULT;  Surgeon: Adin Hector, MD;  Location: [facility];  Service: General;  Laterality: N/A;    Social History:   reports that she quit smoking about 22 years ago. Her smoking use included cigarettes. She has a 30.00 pack-year smoking history. She has never used smokeless tobacco. She reports current alcohol use. She reports that she does not use drugs.  Allergies  Allergen Reactions   Librium [Chlordiazepoxide]     Became unresponsive   Toradol [Ketorolac Tromethamine] Shortness Of Breath    Butalbital-Apap-Caffeine Other (See Comments)    jittery   Demerol Swelling   Esgic [Butalbital-Apap-Caffeine] Other (See Comments)    jittery   Iodine Other (See Comments)    bp bottomed out per pt several hours later; unsure if pre medicated in past with cm; done in [place]    Family History  Problem Relation Age of Onset   Heart disease Mother    Hypertension Mother    Cancer Sister        sinus   Diabetes Brother    Heart disease Brother    Thyroid disease Brother    Cancer Sister        abdominal ?   Thyroid disease Sister    Cancer Other        GE junction adenocarcinoma   Emphysema Father    Stroke Father    Hypertension Father    Heart disease Father    COPD Sister    Thyroid disease Sister    Thyroid disease Sister    Diabetes Brother    Thyroid disease Brother    Thyroid disease Brother    Hypertension Brother    Post-traumatic stress disorder Brother    COPD Brother    Thyroid disease Brother    Thyroid disease Brother    Hypertension Brother    Transient ischemic attack Brother      Prior to Admission medications   Medication Sig Start Date End Date Taking? Authorizing Provider  Aspirin-Caffeine (BC FAST PAIN RELIEF) 845-65 MG PACK Take 1 packet by mouth daily as needed (pain or headache).    [provider]  DULoxetine (CYMBALTA) 60 MG capsule Take 1 capsule (60 mg total) by mouth daily. 04/27/20   [name], FNP  ferrous sulfate 325 (65 FE) MG tablet Take 1 tablet (325 mg total) by mouth daily with breakfast. 04/20/21 05/20/21  Shahmehdi, Valeria Batman, MD  fluticasone-salmeterol (ADVAIR DISKUS) 250-50 MCG/ACT AEPB Inhale 1 puff into the lungs in the morning and at bedtime. 04/19/21 05/19/21  Shahmehdi, Valeria Batman, MD  gabapentin (NEURONTIN) 100 MG capsule Take 3 capsules (300 mg total) by mouth 3 (three) times daily. 04/19/21 05/19/21  Deatra James, MD  levothyroxine (SYNTHROID) 50 MCG tablet Take 1 tablet (50 mcg total) by mouth daily  before breakfast. 06/22/20   Evelina Dun A, FNP  metoprolol tartrate (LOPRESSOR) 25 MG tablet Take 1 tablet (25 mg total) by mouth 2 (two) times daily. 04/19/21 05/19/21  Deatra James, MD  Multiple Vitamin (MULTIVITAMIN WITH MINERALS) TABS tablet Take 1 tablet by mouth daily. 04/19/21 05/19/21  Shahmehdi, Valeria Batman, MD  ondansetron (ZOFRAN) 4 MG tablet Take 1 tablet (4 mg total) by mouth every 8 (eight) hours as needed for nausea or vomiting. 03/24/21   [name], FNP  Vitamin D, Ergocalciferol, (DRISDOL) 1.25 MG (50000 UNIT) CAPS capsule Take 1 capsule (50,000 Units total) by mouth every 7 (seven)  days. 06/22/20   [name], FNP    Physical Exam: Vitals:   05/19/21 0145 05/19/21 0200 05/19/21 0215 05/19/21 0230  BP: (!) 155/78 (!) 153/81 (!) 157/79 (!) 155/79  Pulse: (!) 113 (!) 112 (!) 110 (!) 109  Resp: 16 (!) 23 19 17   Temp:      TempSrc:      SpO2: 100% 100% 100% 100%  Weight:      Height:        Constitutional: NAD, calm  Eyes: PERTLA, lids and conjunctivae normal ENMT: Mucous membranes are dry. Posterior pharynx clear of any exudate or lesions.   Neck: supple, no masses  Respiratory:  no wheezing, no crackles. No accessory muscle use.  Cardiovascular: Rate ~120 and regular. No extremity edema.   Abdomen: No distension, no tenderness, soft. Bowel sounds active.  Musculoskeletal: no clubbing / cyanosis. No joint deformity upper and lower extremities.   Skin: no significant rashes, lesions, ulcers. Warm, dry, well-perfused. Neurologic: CN 2-12 grossly intact. Moving all extremities. Resting tremor. Alert and oriented.  Psychiatric: Calm. Cooperative.    Labs and Imaging on Admission: I have personally reviewed following labs and imaging studies  CBC: Recent Labs  Lab 05/19/21 0020  WBC 13.4*  HGB 11.2*  HCT 33.7*  MCV 83.6  PLT 408   Basic Metabolic Panel: Recent Labs  Lab 05/19/21 0020  NA 134*  K 3.4*  CL 97*  CO2 14*  GLUCOSE 110*  BUN  24*  CREATININE 1.49*  CALCIUM 8.3*   GFR: Estimated Creatinine Clearance: 25.8 mL/min (A) (by C-G formula based on SCr of 1.49 mg/dL (H)). Liver Function Tests: Recent Labs  Lab 05/19/21 0020  AST 46*  ALT 26  ALKPHOS 87  BILITOT 2.5*  PROT 6.4*  ALBUMIN 3.8   Recent Labs  Lab 05/19/21 0020  LIPASE 101*   No results for input(s): AMMONIA in the last 168 hours. Coagulation Profile: Recent Labs  Lab 05/19/21 0020  INR 0.9   Cardiac Enzymes: Recent Labs  Lab 05/19/21 0020  CKTOTAL 462*   BNP (last 3 results) No results for input(s): PROBNP in the last 8760 hours. HbA1C: No results for input(s): HGBA1C in the last 72 hours. CBG: No results for input(s): GLUCAP in the last 168 hours. Lipid Profile: No results for input(s): CHOL, HDL, LDLCALC, TRIG, CHOLHDL, LDLDIRECT in the last 72 hours. Thyroid Function Tests: No results for input(s): TSH, T4TOTAL, FREET4, T3FREE, THYROIDAB in the last 72 hours. Anemia Panel: No results for input(s): VITAMINB12, FOLATE, FERRITIN, TIBC, IRON, RETICCTPCT in the last 72 hours. Urine analysis:    Component Value Date/Time   COLORURINE YELLOW 04/14/2021 1551   APPEARANCEUR CLOUDY (A) 04/14/2021 1551   APPEARANCEUR Cloudy (A) 02/23/2021 1608   LABSPEC 1.013 04/14/2021 1551   PHURINE 8.0 04/14/2021 1551   GLUCOSEU NEGATIVE 04/14/2021 1551   HGBUR SMALL (A) 04/14/2021 1551   BILIRUBINUR NEGATIVE 04/14/2021 1551   BILIRUBINUR Negative 02/23/2021 1608   KETONESUR 5 (A) 04/14/2021 1551   PROTEINUR 30 (A) 04/14/2021 1551   UROBILINOGEN negative 07/01/2015 1541   UROBILINOGEN 0.2 10/07/2012 1928   NITRITE NEGATIVE 04/14/2021 1551   LEUKOCYTESUR MODERATE (A) 04/14/2021 1551   Sepsis Labs: @LABRCNTIP (procalcitonin:4,lacticidven:4) )No results found for this or any previous visit (from the past 240 hour(s)).   Radiological Exams on Admission: No results found.  EKG: Independently reviewed. Sinus tachycardia, rate 115.    Assessment/Plan   1. Acute alcohol withdrawal  - Pt with severe alcohol dependence  p/w tremors, nausea, and anxiety after only drinking 1-2 drinks yesterday  - She continues to have acute withdrawal s/s in ED after 2 mg IV Ativan  - Monitor with CIWA, treat with Ativan, monitor electrolytes, supplement vitamins    2. AKI; metabolic acidosis  - SCr is 1.49 on admission (baseline of 0.7-0.8), serum bicarb is 14, and AG 23 in setting of recent N/V and anorexia  - Continue IVF hydration, monitor serial chem panels    3. Elevated LFTs   - AST 46 and t bili 2.5 on admission with normal INR and no RUQ tenderness  - Check viral hepatitis panel, trend    4. COPD  - Not in exacerbation on admission  - Continue ICS/LABA and prn SABA    5. Hypertension  - Continue metoprolol    6. Hypothyroidism  - Continue Synthroid   7. SIRS  - WBC elevated to 13,400 with mild tachycardia and tachypnea but no infectious process identified  - Likely reactive, plan to monitor and culture if febrile     DVT prophylaxis: Lovenox  Code Status: Full  Level of Care: Level of care: Progressive Family Communication: none present  Disposition Plan:  Patient is from: Home  Anticipated d/c is to: TBD Anticipated d/c date is: 05/22/21 Patient currently: in acute withdrawal despite IV Ativan in ED  Consults called: none  Admission status: Inpatient     Vianne Bulls, MD [facility]  05/19/2021, 3:43 AM

## 2021-05-19 NOTE — ED Notes (Signed)
Pt ambulating in hallway with Physical Therapy with walker. Pt ambulated about 35 ft from room and back.

## 2021-05-19 NOTE — ED Provider Notes (Signed)
[facility] Emergency Department Provider Note MRN:  [id number]  Arrival date & time: 05/19/21     Chief Complaint   Alcohol problem History of Present Illness   Stacy Dalton is a 70 y.o. year-old female with a history of COPD presenting to the ED with chief complaint of alcohol problem.  For the past several months patient has been drinking heavily.  Mostly in order to cope with the loss of her husband and chronic pain.  Has been drinking about a bottle of vodka every day.  Today only had 1 beer and is now feeling very tremulous, heart is racing, feeling generally unwell, complaining of total body pain.  Symptoms are moderate to severe, constant, no exacerbating or alleviating factors.  Review of Systems  A complete 10 system review of systems was obtained and all systems are negative except as noted in the HPI and PMH.   Patient's Health History    Past Medical History:  Diagnosis Date   Chronic pain    COPD (chronic obstructive pulmonary disease) ([place])    told has copd, no current inhaler use   Cough    Depression    ETOH abuse    GERD (gastroesophageal reflux disease)    Hypertension    Hypothyroidism    Insomnia    Migraine    Migraine    Osteopenia    Pain management    Panic attacks    Small bowel obstruction ([place])     Past Surgical History:  Procedure Laterality Date   ABDOMINAL HYSTERECTOMY     COLON SURGERY     COLOSTOMY CLOSURE  04/19/2012   Procedure: COLOSTOMY CLOSURE;  Surgeon: Adin Hector, MD;  Location: [facility];  Service: General;  Laterality: N/A;  Laparotomy, Resection and Closure of Colostomy   COLOSTOMY TAKEDOWN N/A 04/12/2016   Procedure: [place] TAKEDOWN;  Surgeon: Johnathan Hausen, MD;  Location: [facility];  Service: General;  Laterality: N/A;   INCONTINENCE SURGERY     LAPAROTOMY  10/04/2011, colostomy also   Procedure: EXPLORATORY LAPAROTOMY;  Surgeon: Adin Hector, MD;  Location: [facility];  Service: General;   Laterality: N/A;  left partial colectomy with colostomy   LAPAROTOMY  04/19/2012   Procedure: EXPLORATORY LAPAROTOMY;  Surgeon: Adin Hector, MD;  Location: [facility];  Service: General;  Laterality: N/A;   LAPAROTOMY N/A 11/29/2015   Procedure: EXPLORATORY LAPAROTOMY; SUBTOTAL COLECTOMY WITH HARTMAN PROCEDURE AND END COLOSTOMY;  Surgeon: Johnathan Hausen, MD;  Location: [facility];  Service: General;  Laterality: N/A;   TUBAL LIGATION     VENTRAL HERNIA REPAIR  04/19/2012   Procedure: HERNIA REPAIR VENTRAL ADULT;  Surgeon: Adin Hector, MD;  Location: [facility];  Service: General;  Laterality: N/A;    Family History  Problem Relation Age of Onset   Heart disease Mother    Hypertension Mother    Cancer Sister        sinus   Diabetes Brother    Heart disease Brother    Thyroid disease Brother    Cancer Sister        abdominal ?   Thyroid disease Sister    Cancer Other        GE junction adenocarcinoma   Emphysema Father    Stroke Father    Hypertension Father    Heart disease Father    COPD Sister    Thyroid disease Sister    Thyroid disease Sister    Diabetes Brother  Thyroid disease Brother    Thyroid disease Brother    Hypertension Brother    Post-traumatic stress disorder Brother    COPD Brother    Thyroid disease Brother    Thyroid disease Brother    Hypertension Brother    Transient ischemic attack Brother     Social History   Socioeconomic History   Marital status: Widowed    Spouse name: Not on file   Number of children: 5   Years of education: 8   Highest education level: 8th grade  Occupational History   Occupation: Retired  Tobacco Use   Smoking status: Former    Packs/day: 1.50    Years: 20.00    Pack years: 30.00    Types: Cigarettes    Quit date: 10/04/1998    Years since quitting: 22.6   Smokeless tobacco: Never  Vaping Use   Vaping Use: Never used  Substance and Sexual Activity   Alcohol use: Yes    Comment: 1bottle vodka -3 days. As of  03/01/21 pt  states she has quit drinking"for several days."   Drug use: No   Sexual activity: Yes  Other Topics Concern   Not on file  Social History Narrative   Pt lives alone. States 2 children live near by.   Social Determinants of Health   Financial Resource Strain: Low Risk    Difficulty of Paying Living Expenses: Not very hard  Food Insecurity: No Food Insecurity   Worried About Charity fundraiser in the Last Year: Never true   Ran Out of Food in the Last Year: Never true  Transportation Needs: No Transportation Needs   Lack of Transportation (Medical): No   Lack of Transportation (Non-Medical): No  Physical Activity: Inactive   Days of Exercise per Week: 0 days   Minutes of Exercise per Session: 0 min  Stress: Stress Concern Present   Feeling of Stress : To some extent  Social Connections: Socially Isolated   Frequency of Communication with Friends and Family: Three times a week   Frequency of Social Gatherings with Friends and Family: Three times a week   Attends Religious Services: Never   Active Member of Clubs or Organizations: No   Attends Archivist Meetings: Never   Marital Status: Widowed  Human resources officer Violence: Not At Risk   Fear of Current or Ex-Partner: No   Emotionally Abused: No   Physically Abused: No   Sexually Abused: No     Physical Exam   Vitals:   05/19/21 0200 05/19/21 0215  BP: (!) 153/81 (!) 157/79  Pulse: (!) 112 (!) 110  Resp: (!) 23 19  Temp:    SpO2: 100% 100%    CONSTITUTIONAL: Chronically ill-appearing, NAD NEURO:  Alert and oriented x 3, no focal deficits, tremulous EYES:  eyes equal and reactive ENT/NECK:  no LAD, no JVD CARDIO: Tachycardic rate, well-perfused, normal S1 and S2 PULM:  CTAB no wheezing or rhonchi GI/GU:  normal bowel sounds, non-distended, non-tender MSK/SPINE:  No gross deformities, no edema SKIN:  no rash, atraumatic PSYCH:  Appropriate speech and behavior  *Additional and/or pertinent  findings included in MDM below  Diagnostic and Interventional Summary    EKG Interpretation  Date/Time:  Wednesday May 18 2021 23:53:57 EST Ventricular Rate:  115 PR Interval:  117 QRS Duration: 78 QT Interval:  317 QTC Calculation: 439 R Axis:   69 Text Interpretation: Sinus tachycardia Abnormal R-wave progression, early transition Consider anterior infarct Confirmed by Gerlene Fee (  [id number]) on 05/19/2021 2:37:54 AM       Labs Reviewed  CBC - Abnormal; Notable for the following components:      Result Value   WBC 13.4 (*)    Hemoglobin 11.2 (*)    HCT 33.7 (*)    RDW 16.3 (*)    All other components within normal limits  PROTIME-INR  ETHANOL  COMPREHENSIVE METABOLIC PANEL  LIPASE, BLOOD  LACTIC ACID, PLASMA  CK    No orders to display    Medications  HYDROmorphone (DILAUDID) 1 MG/ML injection (has no administration in time range)  LORazepam (ATIVAN) injection 2 mg (has no administration in time range)  LORazepam (ATIVAN) injection 0-4 mg (has no administration in time range)    Or  LORazepam (ATIVAN) tablet 0-4 mg (has no administration in time range)  LORazepam (ATIVAN) injection 0-4 mg (has no administration in time range)    Or  LORazepam (ATIVAN) tablet 0-4 mg (has no administration in time range)  thiamine tablet 100 mg (has no administration in time range)    Or  thiamine (B-1) injection 100 mg (has no administration in time range)  sodium chloride 0.9 % bolus 1,000 mL (0 mLs Intravenous Stopped 05/19/21 0218)  LORazepam (ATIVAN) injection 2 mg (2 mg Intravenous Given 05/19/21 0012)     Procedures  /  Critical Care .Critical Care Performed by: Maudie Flakes, MD Authorized by: Maudie Flakes, MD   Critical care provider statement:    Critical care time (minutes):  31   Critical care time was exclusive of:  Separately billable procedures and treating other patients   Critical care was necessary to treat or prevent imminent or life-threatening  deterioration of the following conditions: Acute alcohol withdrawal.   Critical care was time spent personally by me on the following activities:  Development of treatment plan with patient or surrogate, discussions with consultants, evaluation of patient's response to treatment, examination of patient, ordering and review of laboratory studies, ordering and review of radiographic studies, ordering and performing treatments and interventions, pulse oximetry, re-evaluation of patient's condition and review of old charts  ED Course and Medical Decision Making  I have reviewed the triage vital signs, the nursing notes, and pertinent available records from the EMR.  Listed above are laboratory and imaging tests that I personally ordered, reviewed, and interpreted and then considered in my medical decision making (see below for details).  Acute alcohol withdrawal, providing Ativan.  Chronic pain, no significant changes.  Anticipating admission.       Barth Kirks. [place] Small, [place] [email or web address]  Final Clinical Impressions(s) / ED Diagnoses     ICD-10-CM   1. Alcohol withdrawal syndrome without complication ([place])  I50.277       ED Discharge Orders     None        Discharge Instructions Discussed with and Provided to Patient:   Discharge Instructions   None       Maudie Flakes, MD 05/19/21 [id number]

## 2021-05-19 NOTE — Progress Notes (Signed)
PROGRESS NOTE    Stacy Dalton  YQM:[id number] DOB: March 09, 1951 DOA: 05/18/2021 PCP: [name], FNP   Brief Narrative: 70 -year-old with past medical history significant for alcohol dependence, COPD, hypertension, hypothyroidism, chronic pain, depression, anxiety presents complaining of nausea, tremors, loss of appetite, she has been drinking 1 or 2 drinks yesterday that day prior to admission.  She drinks more than 750 mL of alcohol every day.  She wants to quit drinking alcohol. Patient was admitted for alcohol withdrawal.   Assessment & Plan:   Principal Problem:   Alcohol withdrawal ([name]) Active Problems:   COPD (chronic obstructive pulmonary disease) ([facility])   Hypertension   Chronic back pain   Depression   Hypothyroidism   Anxiety   AKI (acute kidney injury) ([facility])   Elevated LFTs   Metabolic acidosis   1-Acute Alcohol Withdrawal: -Patient with history of severe alcohol dependence presented with tremors, nausea, anxiety.  He has been reducing the amount of alcohol that she has been drinking. -Continue with Ativan and CIWA protocol -Continue with thiamine and folic acid.  2-AKI, metabolic acidosis: Hyponatremia.  Creatinine baseline 0.7-0.8.  Scented with a creatinine of 1.4.  Bicarb 14. -Continue with IV fluids -Start oral bicarb.  3-Transaminases, hyperbilirubinemia In the setting of alcohol use Viral hepatitis panel: Pending.  Trending down  4-COPD: Continue with ICS/LABA  5-Hypertension: Continue with metoprolol 6-Hypothyroidism: Continue with Synthroid 7-SIRS: Presented with leukocytosis and mild tachycardia, tachypnea Suspect related to alcohol withdrawal Hypophosphatemia; replete IV>  Hypomagnesemia; replete IV>     Estimated body mass index is 20.45 kg/m as calculated from the following:   Height as of this encounter: 5\' 1"  (1.549 m).   Weight as of this encounter: 49.1 kg.   DVT prophylaxis: Lovenox Code Status: Full code Family  Communication: care discussed with patient Disposition Plan:  Status is: Inpatient  Remains inpatient appropriate because: Patient presented with alcohol withdrawal plan to continue with Ativan replete electrolytes        Consultants:  None  Procedures:  None  Antimicrobials:    Subjective: She reported generalized pain, she reported nausea, she denies abdominal pain.  She is to quit drinking alcohol  Objective: Vitals:   05/19/21 0500 05/19/21 0545 05/19/21 0630 05/19/21 0715  BP: (!) 139/109 (!) 149/82 114/67 (!) 145/79  Pulse: 98 97 100 98  Resp: 20 20 (!) 23 18  Temp:      TempSrc:      SpO2: (!) 88% 100% 100% 100%  Weight:      Height:        Intake/Output Summary (Last 24 hours) at 05/19/2021 0820 Last data filed at 05/19/2021 5284 Gross per 24 hour  Intake 1196.67 ml  Output 250 ml  Net 946.67 ml   Filed Weights   05/18/21 2352  Weight: 49.1 kg    Examination:  General exam: Appears calm and comfortable  Respiratory system: Clear to auscultation. Respiratory effort normal. Cardiovascular system: S1 & S2 heard, RRR. No JVD, murmurs, rubs, gallops or clicks. No pedal edema. Gastrointestinal system: Abdomen is nondistended, soft and nontender. No organomegaly or masses felt. Normal bowel sounds heard. Central nervous system: Alert and oriented.  Extremities: Symmetric 5 x 5 power.   Data Reviewed: I have personally reviewed following labs and imaging studies  CBC: Recent Labs  Lab 05/19/21 0020 05/19/21 0339  WBC 13.4* 10.2  HGB 11.2* 9.9*  HCT 33.7* 30.3*  MCV 83.6 83.2  PLT 214 132   Basic Metabolic Panel:  Recent Labs  Lab 05/19/21 0020 05/19/21 0339  NA 134* 132*  K 3.4* 3.4*  CL 97* 100  CO2 14* 16*  GLUCOSE 110* 98  BUN 24* 20  CREATININE 1.49* 1.02*  CALCIUM 8.3* 7.7*  MG  --  1.3*  PHOS  --  2.4*   GFR: Estimated Creatinine Clearance: 37.6 mL/min (A) (by C-G formula based on SCr of 1.02 mg/dL (H)). Liver Function  Tests: Recent Labs  Lab 05/19/21 0020 05/19/21 0339  AST 46* 43*  ALT 26 22  ALKPHOS 87 78  BILITOT 2.5* 1.7*  PROT 6.4* 5.6*  ALBUMIN 3.8 3.2*   Recent Labs  Lab 05/19/21 0020  LIPASE 101*   Recent Labs  Lab 05/19/21 0410  AMMONIA <10   Coagulation Profile: Recent Labs  Lab 05/19/21 0020  INR 0.9   Cardiac Enzymes: Recent Labs  Lab 05/19/21 0020  CKTOTAL 462*   BNP (last 3 results) No results for input(s): PROBNP in the last 8760 hours. HbA1C: No results for input(s): HGBA1C in the last 72 hours. CBG: No results for input(s): GLUCAP in the last 168 hours. Lipid Profile: No results for input(s): CHOL, HDL, LDLCALC, TRIG, CHOLHDL, LDLDIRECT in the last 72 hours. Thyroid Function Tests: No results for input(s): TSH, T4TOTAL, FREET4, T3FREE, THYROIDAB in the last 72 hours. Anemia Panel: No results for input(s): VITAMINB12, FOLATE, FERRITIN, TIBC, IRON, RETICCTPCT in the last 72 hours. Sepsis Labs: Recent Labs  Lab 05/19/21 0004  LATICACIDVEN 1.3    Recent Results (from the past 240 hour(s))  Resp Panel by RT-PCR (Flu A&B, Covid) Nasopharyngeal Swab     Status: None   Collection Time: 05/19/21  4:40 AM   Specimen: Nasopharyngeal Swab; Nasopharyngeal(NP) swabs in vial transport medium  Result Value Ref Range Status   SARS Coronavirus 2 by RT PCR NEGATIVE NEGATIVE Final    Comment: (NOTE) SARS-CoV-2 target nucleic acids are NOT DETECTED.  The SARS-CoV-2 RNA is generally detectable in upper respiratory specimens during the acute phase of infection. The lowest concentration of SARS-CoV-2 viral copies this assay can detect is 138 copies/mL. A negative result does not preclude SARS-Cov-2 infection and should not be used as the sole basis for treatment or other patient management decisions. A negative result may occur with  improper specimen collection/handling, submission of specimen other than nasopharyngeal swab, presence of viral mutation(s) within  the areas targeted by this assay, and inadequate number of viral copies(<138 copies/mL). A negative result must be combined with clinical observations, patient history, and epidemiological information. The expected result is Negative.  Fact Sheet for Patients:  EntrepreneurPulse.com.au  Fact Sheet for Healthcare Providers:  IncredibleEmployment.be  This test is no t yet approved or cleared by the [place] FDA and  has been authorized for detection and/or diagnosis of SARS-CoV-2 by FDA under an Emergency Use Authorization (EUA). This EUA will remain  in effect (meaning this test can be used) for the duration of the COVID-19 declaration under Section 564(b)(1) of the Act, 21 U.S.C.section 360bbb-3(b)(1), unless the authorization is terminated  or revoked sooner.       Influenza A by PCR NEGATIVE NEGATIVE Final   Influenza B by PCR NEGATIVE NEGATIVE Final    Comment: (NOTE) The Xpert Xpress SARS-CoV-2/FLU/RSV plus assay is intended as an aid in the diagnosis of influenza from Nasopharyngeal swab specimens and should not be used as a sole basis for treatment. Nasal washings and aspirates are unacceptable for Xpert Xpress SARS-CoV-2/FLU/RSV testing.  Fact Sheet for Patients:  EntrepreneurPulse.com.au  Fact Sheet for Healthcare Providers: IncredibleEmployment.be  This test is not yet approved or cleared by the [place] FDA and has been authorized for detection and/or diagnosis of SARS-CoV-2 by FDA under an Emergency Use Authorization (EUA). This EUA will remain in effect (meaning this test can be used) for the duration of the COVID-19 declaration under Section 564(b)(1) of the Act, 21 U.S.C. section 360bbb-3(b)(1), unless the authorization is terminated or revoked.  Performed at [facility], [place] [address], [place], [place] [address]          Radiology Studies: No results  found.      Scheduled Meds:  enoxaparin (LOVENOX) injection  30 mg Subcutaneous Daily   fluticasone furoate-vilanterol  1 puff Inhalation Daily   folic acid  1 mg Oral Daily   levothyroxine  50 mcg Oral QAC breakfast   LORazepam  0-4 mg Intravenous Q4H   Followed by   Derrill Memo ON 05/21/2021] LORazepam  0-4 mg Intravenous Q8H   metoprolol tartrate  25 mg Oral BID   multivitamin with minerals  1 tablet Oral Daily   potassium chloride  40 mEq Oral Once   sodium bicarbonate  1,300 mg Oral BID   sodium chloride flush  3 mL Intravenous Q12H   thiamine  100 mg Oral Daily   Or   thiamine  100 mg Intravenous Daily   Continuous Infusions:  sodium chloride     magnesium sulfate bolus IVPB     sodium phosphate  Dextrose 5% IVPB       LOS: 0 days    Time spent: 35 minutes.     Elmarie Shiley, MD [facility]   If 7PM-7AM, please contact night-coverage [email or web address]  05/19/2021, 8:20 AM

## 2021-05-20 LAB — CBC
HCT: 32.3 % — ABNORMAL LOW (ref 36.0–46.0)
Hemoglobin: 10.6 g/dL — ABNORMAL LOW (ref 12.0–15.0)
MCH: 27.3 pg (ref 26.0–34.0)
MCHC: 32.8 g/dL (ref 30.0–36.0)
MCV: 83.2 fL (ref 80.0–100.0)
Platelets: 138 10*3/uL — ABNORMAL LOW (ref 150–400)
RBC: 3.88 MIL/uL (ref 3.87–5.11)
RDW: 16.2 % — ABNORMAL HIGH (ref 11.5–15.5)
WBC: 5.1 10*3/uL (ref 4.0–10.5)
nRBC: 0 % (ref 0.0–0.2)

## 2021-05-20 LAB — COMPREHENSIVE METABOLIC PANEL
ALT: 23 U/L (ref 0–44)
AST: 46 U/L — ABNORMAL HIGH (ref 15–41)
Albumin: 3.1 g/dL — ABNORMAL LOW (ref 3.5–5.0)
Alkaline Phosphatase: 72 U/L (ref 38–126)
Anion gap: 8 (ref 5–15)
BUN: 7 mg/dL — ABNORMAL LOW (ref 8–23)
CO2: 26 mmol/L (ref 22–32)
Calcium: 8.5 mg/dL — ABNORMAL LOW (ref 8.9–10.3)
Chloride: 100 mmol/L (ref 98–111)
Creatinine, Ser: 0.77 mg/dL (ref 0.44–1.00)
GFR, Estimated: >60 mL/min (ref >60)
Glucose, Bld: 134 mg/dL — ABNORMAL HIGH (ref 70–99)
Potassium: 3.2 mmol/L — ABNORMAL LOW (ref 3.5–5.1)
Sodium: 134 mmol/L — ABNORMAL LOW (ref 135–145)
Total Bilirubin: 0.6 mg/dL (ref 0.3–1.2)
Total Protein: 5.3 g/dL — ABNORMAL LOW (ref 6.5–8.1)

## 2021-05-20 LAB — GLUCOSE, CAPILLARY: Glucose-Capillary: 137 mg/dL — ABNORMAL HIGH (ref 70–99)

## 2021-05-20 LAB — VITAMIN D 25 HYDROXY (VIT D DEFICIENCY, FRACTURES): Vit D, 25-Hydroxy: 38.42 ng/mL (ref 30–100)

## 2021-05-20 LAB — MAGNESIUM: Magnesium: 1.6 mg/dL — ABNORMAL LOW (ref 1.7–2.4)

## 2021-05-20 MED ORDER — POTASSIUM CHLORIDE CRYS ER 20 MEQ PO TBCR
40.0000 meq | EXTENDED_RELEASE_TABLET | ORAL | Status: AC
  Administered 2021-05-20 (×2): 40 meq via ORAL
  Filled 2021-05-20 (×2): qty 2

## 2021-05-20 MED ORDER — BOOST / RESOURCE BREEZE PO LIQD CUSTOM
1.0000 | Freq: Three times a day (TID) | ORAL | Status: DC
  Administered 2021-05-20 – 2021-05-21 (×4): 1 via ORAL

## 2021-05-20 MED ORDER — MAGNESIUM OXIDE -MG SUPPLEMENT 400 (240 MG) MG PO TABS
400.0000 mg | ORAL_TABLET | Freq: Two times a day (BID) | ORAL | Status: DC
  Administered 2021-05-20 – 2021-05-21 (×3): 400 mg via ORAL
  Filled 2021-05-20 (×3): qty 1

## 2021-05-20 MED ORDER — PROSOURCE PLUS PO LIQD
30.0000 mL | Freq: Two times a day (BID) | ORAL | Status: DC
  Administered 2021-05-20 – 2021-05-21 (×3): 30 mL via ORAL
  Filled 2021-05-20 (×2): qty 30

## 2021-05-20 MED ORDER — TRAMADOL HCL 50 MG PO TABS
50.0000 mg | ORAL_TABLET | Freq: Once | ORAL | Status: AC
  Administered 2021-05-20: 50 mg via ORAL
  Filled 2021-05-20: qty 1

## 2021-05-20 MED ORDER — MAGNESIUM SULFATE 2 GM/50ML IV SOLN
2.0000 g | Freq: Once | INTRAVENOUS | Status: AC
  Administered 2021-05-20: 2 g via INTRAVENOUS
  Filled 2021-05-20: qty 50

## 2021-05-20 MED ORDER — SODIUM BICARBONATE 650 MG PO TABS
650.0000 mg | ORAL_TABLET | Freq: Two times a day (BID) | ORAL | Status: DC
  Administered 2021-05-20 (×2): 650 mg via ORAL
  Filled 2021-05-20 (×2): qty 1

## 2021-05-20 MED ORDER — PROCHLORPERAZINE EDISYLATE 10 MG/2ML IJ SOLN
10.0000 mg | Freq: Once | INTRAMUSCULAR | Status: AC
  Administered 2021-05-20: 10 mg via INTRAVENOUS
  Filled 2021-05-20: qty 2

## 2021-05-20 MED ORDER — DOCUSATE SODIUM 100 MG PO CAPS
100.0000 mg | ORAL_CAPSULE | Freq: Two times a day (BID) | ORAL | Status: DC
  Administered 2021-05-20 – 2021-05-21 (×2): 100 mg via ORAL
  Filled 2021-05-20 (×2): qty 1

## 2021-05-20 MED ORDER — CYANOCOBALAMIN 1000 MCG/ML IJ SOLN
1000.0000 ug | Freq: Every day | INTRAMUSCULAR | Status: DC
  Administered 2021-05-21: 1000 ug via INTRAMUSCULAR
  Filled 2021-05-20: qty 1

## 2021-05-20 MED ORDER — CYANOCOBALAMIN 1000 MCG/ML IJ SOLN
1000.0000 ug | Freq: Once | INTRAMUSCULAR | Status: AC
  Administered 2021-05-20: 1000 ug via INTRAMUSCULAR
  Filled 2021-05-20: qty 1

## 2021-05-20 NOTE — Progress Notes (Addendum)
Initial Nutrition Assessment  DOCUMENTATION CODES:   Non-severe (moderate) malnutrition in context of chronic illness  INTERVENTION:  - Recommend liberalization of diet from heart healthy to regular diet to provide more options to encourage PO intake, spoke with MD who agrees  - Boost Breeze po TID, each supplement provides 250 kcal and 9 grams of protein  - 30 ml ProSource Plus BID, each supplement provides 100 kcals and 15 grams protein.   - Continue MVI with minerals daily  - Check micronutrient labs: Vitamin A, C, D, zinc  NUTRITION DIAGNOSIS:   Moderate Malnutrition related to social / environmental circumstances (depression, anxiety) as evidenced by moderate fat depletion, severe muscle depletion, energy intake < 75% for > or equal to 3 months.   GOAL:   Patient will meet greater than or equal to 90% of their needs   MONITOR:   PO intake, Supplement acceptance, Labs, Weight trends  REASON FOR ASSESSMENT:   Malnutrition Screening Tool    ASSESSMENT:   Pt admitted from home with nausea, tremors and anxiety secondary to acute alcohol withdrawal. PMH includes severe alcohol dependence, COPD, HTN, hypothyroidism, chronic pain, depression, and anxiety.  Pt reports poor appetite and very minimal intake for a few days. She states that she has had intermittent nausea. When she eats well, she usually only has something like a tuna sandwich and states that she is not sure what else she usually eats because she has lost track. Since her husband passed away 8 years ago she has not eaten as much since she has not had to prepare meals for him anymore.   Spoke with pt about importance of adequate nutrition for strength and healing. Observed 95% of breakfast meal tray consumed. She states that Ensure gives her diarrhea but is willing to try Boost with Prosource.  She reports drinking vodka most days of the week. Pt unable to quantify amount of alcohol consumed per day however, per  review of chart noted pt reported drinking 75mL of liquor almost everyday.   She reports that her usual weight is 118 lbs. She endorses weight loss but "not fast." It has been steady over time. Per review of chart noted 8% weight loss x6 months from May-November 2022.  Medications: folvite, synthroid, mag-ox, MVI with minerals, thiamine  Labs: sodium 134, potassium 3.2 (receiving replacement), BUN 7, Phos 2.4 (receiving replacement), Mg 1.6 (receiving replacement), AST 46, vitamin B12 164  NUTRITION - FOCUSED PHYSICAL EXAM:  Flowsheet Row Most Recent Value  Orbital Region Moderate depletion  Upper Arm Region Moderate depletion  Thoracic and Lumbar Region Moderate depletion  Buccal Region Severe depletion  Temple Region Moderate depletion  Clavicle Bone Region Severe depletion  Clavicle and Acromion Bone Region Severe depletion  Scapular Bone Region Moderate depletion  Dorsal Hand Severe depletion  Patellar Region Severe depletion  Anterior Thigh Region Severe depletion  Posterior Calf Region Severe depletion  Edema (RD Assessment) None  Hair Other (Comment)  [thinning,  pt reports hair loss]  Eyes Reviewed  Mouth Other (Comment)  [yellowish coating on tongue]  Skin Reviewed  Nails Reviewed       Diet Order:   Diet Order             Diet regular Room service appropriate? Yes; Fluid consistency: Thin  Diet effective now                   EDUCATION NEEDS:   Education needs have been addressed  Skin:  Skin Assessment:  Reviewed RN Assessment  Last BM:  05/19/21  Height:   Ht Readings from Last 1 Encounters:  05/18/21 5\' 1"  (1.549 m)    Weight:   Wt Readings from Last 1 Encounters:  05/19/21 50.9 kg    BMI:  Body mass index is 21.2 kg/m.  Estimated Nutritional Needs:   Kcal:  1600-1800  Protein:  80-90g  Fluid:  >1.6L  Clayborne Dana, RDN, LDN Clinical Nutrition

## 2021-05-20 NOTE — TOC Initial Note (Signed)
Transition of Care [facility]) - Initial/Assessment Note    Patient Details  Name: Stacy Dalton MRN: [id number] Date of Birth: 1950/12/20  Transition of Care [facility]) CM/SW Contact:    Pollie Friar, RN Phone Number: 05/20/2021, 2:07 PM  Clinical Narrative:                 Patient is from home alone. She states her boyfriend lives across the street and he checks in on her.  She denies any home DME.  Pt denies issues with home medications.  She drives self to needed appts, etc.  Recommendations for [facility] services. Pt recently used [facility] for her home health needs. She is in agreement with using them again. Will update [place] with [facility].  TOC following for further d/c needs.   Expected Discharge Plan: [place] Barriers to Discharge: Continued Medical Work up   Patient Goals and CMS Choice   CMS Medicare.gov Compare Post Acute Care list provided to:: Patient Choice offered to / list presented to : Patient  Expected Discharge Plan and Services Expected Discharge Plan: [place]   Discharge Planning Services: CM Consult Post Acute Care Choice: [place] arrangements for the past 2 months: Single Family Home                                      Prior Living Arrangements/Services Living arrangements for the past 2 months: Single Family Home Lives with:: Self Patient language and need for interpreter reviewed:: Yes Do you feel safe going back to the place where you live?: Yes        Care giver support system in place?: No (comment)   Criminal Activity/Legal Involvement Pertinent to Current Situation/Hospitalization: No - Comment as needed  Activities of Daily Living Home Assistive Devices/Equipment: Gilford Rile (specify type) ADL Screening (condition at time of admission) Patient's cognitive ability adequate to safely complete daily activities?: Yes ("when not drinking") Is the patient deaf or have difficulty hearing?: No Does the  patient have difficulty seeing, even when wearing glasses/contacts?: No Does the patient have difficulty concentrating, remembering, or making decisions?: No Patient able to express need for assistance with ADLs?: Yes Does the patient have difficulty dressing or bathing?: No Independently performs ADLs?: Yes (appropriate for developmental age) Does the patient have difficulty walking or climbing stairs?: Yes Weakness of Legs: Both (general weakness) Weakness of Arms/Hands: Both (general weakness)  Permission Sought/Granted                  Emotional Assessment Appearance:: Appears stated age Attitude/Demeanor/Rapport: Engaged Affect (typically observed): Accepting Orientation: : Oriented to Self, Oriented to Place, Oriented to  Time, Oriented to Situation   Psych Involvement: No (comment)  Admission diagnosis:  Alcohol withdrawal ([place]) [F10.939] Alcohol withdrawal syndrome without complication ([place]) [U72.536] Patient Active Problem List   Diagnosis Date Noted   AKI (acute kidney injury) ([place]) 05/19/2021   Elevated LFTs 64/40/3474   Metabolic acidosis 25/95/6387   COVID-19 virus infection 04/15/2021    Class: Acute   Hypotensive episode 04/15/2021    Class: Acute   Physical deconditioning    Vitamin B12 deficiency 02/04/2021   Leukocytosis 11/23/2020   Alcohol use disorder, severe, dependence ([place]) 10/08/2020   Hyponatremia 09/17/2020   Alcohol withdrawal ([place]) 09/12/2020   Chronic bronchitis ([place]) 08/20/2020   Moderate protein malnutrition ([place]) 08/20/2020   Chronic low back  pain 08/19/2020   Elevated SGOT (AST) 08/19/2020   Elevated troponin 08/19/2020   Increased serum lipase level 08/19/2020   Large bowel obstruction (HCC)    Small bowel obstruction ([place]) 07/20/2018   Hypomagnesemia 07/20/2018   Cough 03/14/2018   Polypharmacy 02/23/2017   Hypokalemia 27/78/2423   Toxic metabolic encephalopathy 53/61/4431   Anxiety 02/22/2017   GERD (gastroesophageal reflux  disease) 02/22/2017   Pain medication agreement signed 10/10/2016   Opioid dependence ([place]) 10/10/2016   Status post colostomy takedown 04/12/2016   Chronic constipation 12/03/2015   Peritonitis with abscess of intestine ([place]) 11/29/2015   COPD exacerbation ([place]) 09/20/2015   Osteopenia 06/10/2014   Vitamin D deficiency 06/10/2014   Hypothyroidism    Depression 11/18/2012   Insomnia 11/18/2012   Chronic pain syndrome 11/18/2012   HLD (hyperlipidemia) 09/01/2012   S/P colostomy ([place]) 04/19/2012   Normocytic anemia 10/15/2011   Hypertension 10/13/2011   Chronic back pain 10/13/2011   COPD (chronic obstructive pulmonary disease) ([place]) 10/09/2011   PCP:  [name], FNP Pharmacy:   [facility] [place], [address] [place] [address] Phone: [phone] Fax: [phone]     Social Determinants of Health (SDOH) Interventions    Readmission Risk Interventions Readmission Risk Prevention Plan 04/19/2021 04/15/2021  Transportation Screening - Complete  Medication Review Press photographer) - Complete  PCP or Specialist appointment within 3-5 days of discharge Complete -  HRI or [place] - Complete  SW Recovery Care/Counseling Consult - Complete  [place] - Complete  Some recent data might be hidden

## 2021-05-20 NOTE — Evaluation (Signed)
Physical Therapy Evaluation Patient Details Name: Stacy Dalton MRN: [id number] DOB: Sep 08, 1950 Today's Date: 05/20/2021  History of Present Illness  70 y.o. female with medical history significant for severe alcohol dependence, COPD, hypertension, hypothyroidism, chronic pain, depression, and anxiety, now presenting to the emergency department with nausea, loss of appetite, tremors, and anxiety.  Hospitalization in October for a similar presentation.  Clinical Impression  Patient presents with decreased mobility due to generalized weakness, decreased balance and safety awareness.  Reports some difficulty rising from chair at home, though normally independent, driving and has a friend who stays with her who cooks.  She has had more difficulty lately with medication management and finances.  Reports her daughter who lives close won't help.  Patient will benefit from skilled PT in the acute setting to progress safety and independence and from follow up [place] at d/c.        Recommendations for follow up therapy are one component of a multi-disciplinary discharge planning process, led by the attending physician.  Recommendations may be updated based on patient status, additional functional criteria and insurance authorization.  Follow Up Recommendations Home health PT    Assistance Recommended at Discharge Intermittent Supervision/Assistance  Functional Status Assessment Patient has had a recent decline in their functional status and demonstrates the ability to make significant improvements in function in a reasonable and predictable amount of time.  Equipment Recommendations  None recommended by PT    Recommendations for Other Services       Precautions / Restrictions Precautions Precautions: Fall Restrictions Weight Bearing Restrictions: No      Mobility  Bed Mobility Overal bed mobility: Modified Independent             General bed mobility comments: with elevated HOB     Transfers Overall transfer level: Needs assistance Equipment used: Rolling walker (2 wheels) Transfers: Sit to/from Stand Sit to Stand: Min guard           General transfer comment: assist due to lines heart monitor not secured and IV and pt slightly impulsive needing to use the bathroom    Ambulation/Gait Ambulation/Gait assistance: Min guard;Supervision Gait Distance (Feet): 15 Feet (&12') Assistive device: Rolling walker (2 wheels) Gait Pattern/deviations: Step-through pattern;Decreased stride length;Trunk flexed;Wide base of support       General Gait Details: initially minguard for safety with walker too high, adjusted while pt in bathroom and improved but S for safety in small space around bed to chair; declined further ambulation in hallway reports "feeling sick"  Stairs            Wheelchair Mobility    Modified Rankin (Stroke Patients Only)       Balance Overall balance assessment: Needs assistance   Sitting balance-Leahy Scale: Good     Standing balance support: No upper extremity supported;During functional activity Standing balance-Leahy Scale: Fair Standing balance comment: up to pull up panties with S for safety                             Pertinent Vitals/Pain Faces Pain Scale: Hurts a little bit Pain Location: head Pain Descriptors / Indicators: Headache Pain Intervention(s): Premedicated before session    Home Living Family/patient expects to be discharged to:: Private residence Living Arrangements: Non-relatives/Friends ([place], close friend) Available Help at Discharge: Friend(s);Available PRN/intermittently Type of Home: House Home Access: Stairs to enter Entrance Stairs-Rails: None (post) Entrance Stairs-Number of Steps: 1-2   Home Layout: One  level Home Equipment: Conservation officer, nature (2 wheels);BSC/3in1;Shower seat      Prior Function Prior Level of Function : Independent/Modified Independent             Mobility  Comments: occasional use of RW if she felt unstable ADLs Comments: No assist with ADL/IADL or med management.     Hand Dominance   Dominant Hand: Right    Extremity/Trunk Assessment   Upper Extremity Assessment Upper Extremity Assessment: Defer to OT evaluation    Lower Extremity Assessment Lower Extremity Assessment: Generalized weakness    Cervical / Trunk Assessment Cervical / Trunk Assessment: Kyphotic  Communication   Communication: No difficulties  Cognition Arousal/Alertness: Awake/alert Behavior During Therapy: Anxious Overall Cognitive Status: Impaired/Different from baseline Area of Impairment: Safety/judgement;Memory                         Safety/Judgement: Decreased awareness of safety   Problem Solving: Slow processing;Requires verbal cues          General Comments General comments (skin integrity, edema, etc.): toileted in bathroom, pt performed hygiene in sitting    Exercises     Assessment/Plan    PT Assessment Patient needs continued PT services  PT Problem List Decreased strength;Decreased activity tolerance;Decreased mobility;Decreased knowledge of precautions;Decreased balance;Decreased safety awareness       PT Treatment Interventions DME instruction;Balance training;Gait training;Functional mobility training;Patient/family education;Therapeutic activities;Therapeutic exercise;Stair training    PT Goals (Current goals can be found in the Care Plan section)  Acute Rehab PT Goals Patient Stated Goal: To return home, get help with finances PT Goal Formulation: With patient Time For Goal Achievement: 06/03/21 Potential to Achieve Goals: Good    Frequency Min 3X/week   Barriers to discharge Decreased caregiver support      Co-evaluation               AM-PAC PT "6 Clicks" Mobility  Outcome Measure Help needed turning from your back to your side while in a flat bed without using bedrails?: None Help needed moving from  lying on your back to sitting on the side of a flat bed without using bedrails?: None Help needed moving to and from a bed to a chair (including a wheelchair)?: A Little Help needed standing up from a chair using your arms (e.g., wheelchair or bedside chair)?: A Little Help needed to walk in hospital room?: A Little Help needed climbing 3-5 steps with a railing? : A Little 6 Click Score: 20    End of Session   Activity Tolerance: Patient limited by fatigue Patient left: in chair;with call bell/phone within reach;with chair alarm set Nurse Communication: Mobility status PT Visit Diagnosis: Other abnormalities of gait and mobility (R26.89);Muscle weakness (generalized) (M62.81)    Time: 5364-6803 PT Time Calculation (min) (ACUTE ONLY): 30 min   Charges:   PT Evaluation $PT Eval Moderate Complexity: 1 Mod PT Treatments $Therapeutic Activity: 8-22 mins        Magda Kiel, PT Acute Rehabilitation Services OZYYQ:[phone] Office:[phone] 05/20/2021   Reginia Naas 05/20/2021, 11:30 AM

## 2021-05-20 NOTE — Progress Notes (Addendum)
   05/20/21 1000  Clinical Encounter Type  Visited With Patient  Visit Type Initial;Psychological support  Referral From Nurse  Consult/Referral To Chaplain   Chaplain Jorene Guest responded to the consult request for an Advance Directive. Ike Bene provided A.D. education. The patient said the A.D was not what she needed. The patient said she is concerned about her financial situation/her bills while she is in the hospital. Ike Bene advised the patient to inform the unit's CSW of her concerns. Ike Bene spoke with CSW, Claiborne Billings, and Benjamine Mola was in their office, making them aware of the patient's concerns. This note was prepared by Jeanine Luz, M.Div. For questions, please contact by phone [phone].

## 2021-05-20 NOTE — TOC CAGE-AID Note (Signed)
Transition of Care [facility]) - CAGE-AID Screening   Patient Details  Name: Stacy Dalton MRN: [id number] Date of Birth: Apr 08, 1951  [facility]) CM/SW Contact:    Pollie Friar, RN Phone Number: 05/20/2021, 12:49 PM   Clinical Narrative: Patient admitted to alcohol use. She was provided inpatient/ outpatient alcohol counseling resources.    CAGE-AID Screening:    Have You Ever Felt You Ought to Cut Down on Your Drinking or Drug Use?: Yes Have People Annoyed You By Critizing Your Drinking Or Drug Use?: No Have You Felt Bad Or Guilty About Your Drinking Or Drug Use?: Yes Have You Ever Had a Drink or Used Drugs First Thing In The Morning to Steady Your Nerves or to Get Rid of a Hangover?: Yes CAGE-AID Score: 3  Substance Abuse Education Offered: Yes  Substance abuse interventions: Patient Counseling, Scientist, clinical (histocompatibility and immunogenetics)

## 2021-05-20 NOTE — Progress Notes (Addendum)
PROGRESS NOTE    Stacy Dalton  BHA:[id number] DOB: 04/25/1951 DOA: 05/18/2021 PCP: [name], FNP   Brief Narrative: 70 -year-old with past medical history significant for alcohol dependence, COPD, hypertension, hypothyroidism, chronic pain, depression, anxiety presents complaining of nausea, tremors, loss of appetite, she has been drinking 1 or 2 drinks yesterday that day prior to admission.  She drinks more than 750 mL of alcohol every day.  She wants to quit drinking alcohol. Patient was admitted for alcohol withdrawal.   Assessment & Plan:   Principal Problem:   Alcohol withdrawal ([place]) Active Problems:   COPD (chronic obstructive pulmonary disease) (HCC)   Hypertension   Chronic back pain   Depression   Hypothyroidism   Anxiety   AKI (acute kidney injury) (HCC)   Elevated LFTs   Metabolic acidosis   1-Acute Alcohol Withdrawal: -Patient with history of severe alcohol dependence presented with tremors, nausea, anxiety.  He has been reducing the amount of alcohol that she has been drinking. -Continue with Ativan and CIWA protocol -Continue with thiamine and folic acid. -CIWA score: 7  2-AKI, metabolic acidosis: Hyponatremia.  Creatinine baseline 0.7-0.8.  Scented with a creatinine of 1.4.  Bicarb 14. -Continue with IV fluids -Started  oral bicarb.acidosis resolved, decrease oral bicarb tablet.  -AKI resolved.    3-Transaminases, hyperbilirubinemia In the setting of alcohol use Viral hepatitis panel: Non Reactive.  Trending down Resolving.   4-COPD: Continue with ICS/LABA  5-Hypertension: Continue with metoprolol 6-Hypothyroidism: Continue with Synthroid 7-SIRS: Presented with leukocytosis and mild tachycardia, tachypnea Suspect related to alcohol withdrawal Hypophosphatemia; replaced.  Hypomagnesemia; replete IV> and oral.  Hypokalemia. Replete orally.   B12 deficiency:  B 12: 164. Start IM B12 supplement.   Estimated body mass index is 21.2  kg/m as calculated from the following:   Height as of this encounter: 5\' 1"  (1.549 m).   Weight as of this encounter: 50.9 kg.   DVT prophylaxis: Lovenox Code Status: Full code Family Communication: care discussed with patient Disposition Plan:  Status is: Inpatient  Remains inpatient appropriate because: Patient presented with alcohol withdrawal plan to continue with Ativan replete electrolytes        Consultants:  None  Procedures:  None  Antimicrobials:    Subjective: She is not feeling well, denies dyspnea. Still having tremors.   Objective: Vitals:   05/20/21 0150 05/20/21 0440 05/20/21 0814 05/20/21 1156  BP: (!) 159/98 (!) 152/101 (!) 143/99 (!) 152/89  Pulse: 88 97 85 92  Resp: 16 18 17 18   Temp:  97.6 F (36.4 C) 98.4 F (36.9 C) 98.4 F (36.9 C)  TempSrc:  Oral Oral Oral  SpO2: 96% 96% 97% 98%  Weight:      Height:        Intake/Output Summary (Last 24 hours) at 05/20/2021 1434 Last data filed at 05/20/2021 0500 Gross per 24 hour  Intake 1450 ml  Output 2100 ml  Net -650 ml    Filed Weights   05/18/21 2352 05/19/21 2142  Weight: 49.1 kg 50.9 kg    Examination:  General exam: NAD Respiratory system:CTA Cardiovascular system: S 1, S 2 RRR Gastrointestinal system: BS present, soft, nt Central nervous system: Alert Extremities: no edema   Data Reviewed: I have personally reviewed following labs and imaging studies  CBC: Recent Labs  Lab 05/19/21 0020 05/19/21 0339 05/20/21 0129  WBC 13.4* 10.2 5.1  HGB 11.2* 9.9* 10.6*  HCT 33.7* 30.3* 32.3*  MCV 83.6 83.2 83.2  PLT  214 172 138*    Basic Metabolic Panel: Recent Labs  Lab 05/19/21 0020 05/19/21 0339 05/20/21 0129  NA 134* 132* 134*  K 3.4* 3.4* 3.2*  CL 97* 100 100  CO2 14* 16* 26  GLUCOSE 110* 98 134*  BUN 24* 20 7*  CREATININE 1.49* 1.02* 0.77  CALCIUM 8.3* 7.7* 8.5*  MG  --  1.3* 1.6*  PHOS  --  2.4*  --     GFR: Estimated Creatinine Clearance: 48 mL/min  (by C-G formula based on SCr of 0.77 mg/dL). Liver Function Tests: Recent Labs  Lab 05/19/21 0020 05/19/21 0339 05/20/21 0129  AST 46* 43* 46*  ALT 26 22 23   ALKPHOS 87 78 72  BILITOT 2.5* 1.7* 0.6  PROT 6.4* 5.6* 5.3*  ALBUMIN 3.8 3.2* 3.1*    Recent Labs  Lab 05/19/21 0020  LIPASE 101*    Recent Labs  Lab 05/19/21 0410  AMMONIA <10    Coagulation Profile: Recent Labs  Lab 05/19/21 0020  INR 0.9    Cardiac Enzymes: Recent Labs  Lab 05/19/21 0020  CKTOTAL 462*    BNP (last 3 results) No results for input(s): PROBNP in the last 8760 hours. HbA1C: No results for input(s): HGBA1C in the last 72 hours. CBG: No results for input(s): GLUCAP in the last 168 hours. Lipid Profile: No results for input(s): CHOL, HDL, LDLCALC, TRIG, CHOLHDL, LDLDIRECT in the last 72 hours. Thyroid Function Tests: No results for input(s): TSH, T4TOTAL, FREET4, T3FREE, THYROIDAB in the last 72 hours. Anemia Panel: Recent Labs    05/19/21 0907  VITAMINB12 164*  FERRITIN 119  TIBC 356  IRON 144   Sepsis Labs: Recent Labs  Lab 05/19/21 0004  LATICACIDVEN 1.3     Recent Results (from the past 240 hour(s))  Resp Panel by RT-PCR (Flu A&B, Covid) Nasopharyngeal Swab     Status: None   Collection Time: 05/19/21  4:40 AM   Specimen: Nasopharyngeal Swab; Nasopharyngeal(NP) swabs in vial transport medium  Result Value Ref Range Status   SARS Coronavirus 2 by RT PCR NEGATIVE NEGATIVE Final    Comment: (NOTE) SARS-CoV-2 target nucleic acids are NOT DETECTED.  The SARS-CoV-2 RNA is generally detectable in upper respiratory specimens during the acute phase of infection. The lowest concentration of SARS-CoV-2 viral copies this assay can detect is 138 copies/mL. A negative result does not preclude SARS-Cov-2 infection and should not be used as the sole basis for treatment or other patient management decisions. A negative result may occur with  improper specimen  collection/handling, submission of specimen other than nasopharyngeal swab, presence of viral mutation(s) within the areas targeted by this assay, and inadequate number of viral copies(<138 copies/mL). A negative result must be combined with clinical observations, patient history, and epidemiological information. The expected result is Negative.  Fact Sheet for Patients:  EntrepreneurPulse.com.au  Fact Sheet for Healthcare Providers:  IncredibleEmployment.be  This test is no t yet approved or cleared by the [place] FDA and  has been authorized for detection and/or diagnosis of SARS-CoV-2 by FDA under an Emergency Use Authorization (EUA). This EUA will remain  in effect (meaning this test can be used) for the duration of the COVID-19 declaration under Section 564(b)(1) of the Act, 21 U.S.C.section 360bbb-3(b)(1), unless the authorization is terminated  or revoked sooner.       Influenza A by PCR NEGATIVE NEGATIVE Final   Influenza B by PCR NEGATIVE NEGATIVE Final    Comment: (NOTE) The Xpert Xpress SARS-CoV-2/FLU/RSV plus  assay is intended as an aid in the diagnosis of influenza from Nasopharyngeal swab specimens and should not be used as a sole basis for treatment. Nasal washings and aspirates are unacceptable for Xpert Xpress SARS-CoV-2/FLU/RSV testing.  Fact Sheet for Patients: EntrepreneurPulse.com.au  Fact Sheet for Healthcare Providers: IncredibleEmployment.be  This test is not yet approved or cleared by the [place] FDA and has been authorized for detection and/or diagnosis of SARS-CoV-2 by FDA under an Emergency Use Authorization (EUA). This EUA will remain in effect (meaning this test can be used) for the duration of the COVID-19 declaration under Section 564(b)(1) of the Act, 21 U.S.C. section 360bbb-3(b)(1), unless the authorization is terminated or revoked.  Performed at [facility], [place] [address], [place], [place] [address]           Radiology Studies: No results found.      Scheduled Meds:  (feeding supplement) PROSource Plus  30 mL Oral BID BM   docusate sodium  100 mg Oral BID   enoxaparin (LOVENOX) injection  30 mg Subcutaneous Daily   feeding supplement  1 Container Oral TID BM   fluticasone furoate-vilanterol  1 puff Inhalation Daily   folic acid  1 mg Oral Daily   levothyroxine  50 mcg Oral QAC breakfast   LORazepam  0-4 mg Intravenous Q4H   Followed by   Derrill Memo ON 05/21/2021] LORazepam  0-4 mg Intravenous Q8H   magnesium oxide  400 mg Oral BID   metoprolol tartrate  25 mg Oral BID   multivitamin with minerals  1 tablet Oral Daily   sodium bicarbonate  650 mg Oral BID   sodium chloride flush  3 mL Intravenous Q12H   thiamine  100 mg Oral Daily   Or   thiamine  100 mg Intravenous Daily   Continuous Infusions:  sodium chloride 100 mL/hr at 05/20/21 0436     LOS: 1 day    Time spent: 35 minutes.     Elmarie Shiley, MD [facility]   If 7PM-7AM, please contact night-coverage [email or web address]  05/20/2021, 2:34 PM

## 2021-05-21 DIAGNOSIS — F10939 Alcohol use, unspecified with withdrawal, unspecified: Secondary | ICD-10-CM

## 2021-05-21 DIAGNOSIS — F419 Anxiety disorder, unspecified: Secondary | ICD-10-CM

## 2021-05-21 LAB — CBC
HCT: 31.4 % — ABNORMAL LOW (ref 36.0–46.0)
Hemoglobin: 10.3 g/dL — ABNORMAL LOW (ref 12.0–15.0)
MCH: 27.8 pg (ref 26.0–34.0)
MCHC: 32.8 g/dL (ref 30.0–36.0)
MCV: 84.9 fL (ref 80.0–100.0)
Platelets: 128 10*3/uL — ABNORMAL LOW (ref 150–400)
RBC: 3.7 MIL/uL — ABNORMAL LOW (ref 3.87–5.11)
RDW: 16 % — ABNORMAL HIGH (ref 11.5–15.5)
WBC: 6.6 10*3/uL (ref 4.0–10.5)
nRBC: 0 % (ref 0.0–0.2)

## 2021-05-21 LAB — BASIC METABOLIC PANEL
Anion gap: 8 (ref 5–15)
BUN: 12 mg/dL (ref 8–23)
CO2: 25 mmol/L (ref 22–32)
Calcium: 8.6 mg/dL — ABNORMAL LOW (ref 8.9–10.3)
Chloride: 97 mmol/L — ABNORMAL LOW (ref 98–111)
Creatinine, Ser: 0.74 mg/dL (ref 0.44–1.00)
GFR, Estimated: >60 mL/min (ref >60)
Glucose, Bld: 117 mg/dL — ABNORMAL HIGH (ref 70–99)
Potassium: 4 mmol/L (ref 3.5–5.1)
Sodium: 130 mmol/L — ABNORMAL LOW (ref 135–145)

## 2021-05-21 LAB — MAGNESIUM: Magnesium: 1.8 mg/dL (ref 1.7–2.4)

## 2021-05-21 LAB — PHOSPHORUS: Phosphorus: 2.1 mg/dL — ABNORMAL LOW (ref 2.5–4.6)

## 2021-05-21 MED ORDER — THIAMINE HCL 100 MG PO TABS: 100.0000 mg | ORAL_TABLET | Freq: Every day | ORAL | 0 refills | Status: DC

## 2021-05-21 MED ORDER — ENOXAPARIN SODIUM 40 MG/0.4ML IJ SOSY: 40.0000 mg | PREFILLED_SYRINGE | Freq: Every day | INTRAMUSCULAR | Status: DC

## 2021-05-21 MED ORDER — ADULT MULTIVITAMIN W/MINERALS CH: 1.0000 | ORAL_TABLET | Freq: Every day | ORAL | 0 refills | Status: DC

## 2021-05-21 MED ORDER — SODIUM PHOSPHATES 45 MMOLE/15ML IV SOLN
15.0000 mmol | Freq: Once | INTRAVENOUS | Status: AC
  Administered 2021-05-21: 15 mmol via INTRAVENOUS
  Filled 2021-05-21: qty 5

## 2021-05-21 MED ORDER — AMLODIPINE BESYLATE 5 MG PO TABS: 5.0000 mg | ORAL_TABLET | Freq: Every day | ORAL | 2 refills | Status: DC

## 2021-05-21 MED ORDER — AMLODIPINE BESYLATE 5 MG PO TABS
5.0000 mg | ORAL_TABLET | Freq: Every day | ORAL | Status: DC
  Administered 2021-05-21: 5 mg via ORAL
  Filled 2021-05-21: qty 1

## 2021-05-21 MED ORDER — MAGNESIUM SULFATE 2 GM/50ML IV SOLN
2.0000 g | Freq: Once | INTRAVENOUS | Status: AC
Start: 2021-05-21 — End: 2021-05-21
  Administered 2021-05-21: 2 g via INTRAVENOUS
  Filled 2021-05-21: qty 50

## 2021-05-21 MED ORDER — FOLIC ACID 1 MG PO TABS: 1.0000 mg | ORAL_TABLET | Freq: Every day | ORAL | 1 refills | Status: DC

## 2021-05-21 MED ORDER — VITAMIN B-12 1000 MCG PO TABS: 1000.0000 ug | ORAL_TABLET | Freq: Every day | ORAL | 0 refills | Status: DC

## 2021-05-21 NOTE — Progress Notes (Signed)
PROGRESS NOTE    Stacy Dalton  PZW:[id number] DOB: 1951-04-20 DOA: 05/18/2021 PCP: [name], FNP   Brief Narrative: 70 -year-old with past medical history significant for alcohol dependence, COPD, hypertension, hypothyroidism, chronic pain, depression, anxiety presents complaining of nausea, tremors, loss of appetite, she has been drinking 1 or 2 drinks yesterday that day prior to admission.  She drinks more than 750 mL of alcohol every day.  She wants to quit drinking alcohol. Patient was admitted for alcohol withdrawal.   Assessment & Plan:   Principal Problem:   Alcohol withdrawal ([facility]) Active Problems:   COPD (chronic obstructive pulmonary disease) (HCC)   Hypertension   Chronic back pain   Depression   Hypothyroidism   Anxiety   AKI (acute kidney injury) (HCC)   Elevated LFTs   Metabolic acidosis   1-Acute Alcohol Withdrawal: -Patient with history of severe alcohol dependence presented with tremors, nausea, anxiety.  He has been reducing the amount of alcohol that she has been drinking. -Continue with Ativan and CIWA protocol -Continue with thiamine and folic acid. -CIWA score: 14, continue with ativan.   2-Anxiety;  She is asking for medication management for anxiety for when she is discharge home. Will ask Psych to see and evaluate.   3-AKI, metabolic acidosis: Hyponatremia.  Creatinine baseline 0.7-0.8.  Scented with a creatinine of 1.4.  Bicarb 14. -Continue with IV fluids -Metabolic acidosis resolved. Discontinue sodium bicarb tablet.  -AKI resolved.    3-Transaminases, hyperbilirubinemia In the setting of alcohol use Viral hepatitis panel: Non Reactive.  Trending down Resolving.   4-COPD: Continue with ICS/LABA  5-Hypertension: Continue with metoprolol. BP elevated, will add Norvasc.  6-Hypothyroidism: Continue with Synthroid 7-SIRS: Presented with leukocytosis and mild tachycardia, tachypnea Suspect related to alcohol  withdrawal Hypophosphatemia; Replete IV>  Hypomagnesemia; replete IV> and oral.  Hypokalemia. Replaced.    B12 deficiency:  B 12: 164. Start IM B12 supplement.   Chronic Pain; on tramadol. She will need to follow up with PCP for pain management.   Estimated body mass index is 21.2 kg/m as calculated from the following:   Height as of this encounter: 5\' 1"  (1.549 m).   Weight as of this encounter: 50.9 kg.   DVT prophylaxis: Lovenox Code Status: Full code Family Communication: care discussed with patient Disposition Plan:  Status is: Inpatient  Remains inpatient appropriate because: Patient presented with alcohol withdrawal plan to continue with Ativan replete electrolytes. Home in 1 or 2 days.         Consultants:  None  Procedures:  None  Antimicrobials:    Subjective: She report some improvement, still feels weak.  She would like medication management for anxiety when she goes ho,me.    Objective: Vitals:   05/20/21 1941 05/20/21 2308 05/21/21 0338 05/21/21 0746  BP: (!) 157/92 (!) 155/99 (!) 163/84   Pulse: 95 91 80   Resp: 18 18 19    Temp: 98.5 F (36.9 C) 99.1 F (37.3 C) 98 F (36.7 C)   TempSrc: Oral Oral Oral   SpO2: 99% 96% 97% 98%  Weight:      Height:        Intake/Output Summary (Last 24 hours) at 05/21/2021 0828 Last data filed at 05/21/2021 0348 Gross per 24 hour  Intake 1017.59 ml  Output 3000 ml  Net -1982.41 ml    Filed Weights   05/18/21 2352 05/19/21 2142  Weight: 49.1 kg 50.9 kg    Examination:  General exam: NAD Respiratory system:  CTA Cardiovascular system: S 1, S 2 RRR Gastrointestinal system: BS present, soft, nt Central nervous system: Alert, follows command Extremities: No edema   Data Reviewed: I have personally reviewed following labs and imaging studies  CBC: Recent Labs  Lab 05/19/21 0020 05/19/21 0339 05/20/21 0129 05/21/21 0216  WBC 13.4* 10.2 5.1 6.6  HGB 11.2* 9.9* 10.6* 10.3*  HCT 33.7*  30.3* 32.3* 31.4*  MCV 83.6 83.2 83.2 84.9  PLT 214 172 138* 128*    Basic Metabolic Panel: Recent Labs  Lab 05/19/21 0020 05/19/21 0339 05/20/21 0129 05/21/21 0216  NA 134* 132* 134* 130*  K 3.4* 3.4* 3.2* 4.0  CL 97* 100 100 97*  CO2 14* 16* 26 25  GLUCOSE 110* 98 134* 117*  BUN 24* 20 7* 12  CREATININE 1.49* 1.02* 0.77 0.74  CALCIUM 8.3* 7.7* 8.5* 8.6*  MG  --  1.3* 1.6* 1.8  PHOS  --  2.4*  --  2.1*    GFR: Estimated Creatinine Clearance: 48 mL/min (by C-G formula based on SCr of 0.74 mg/dL). Liver Function Tests: Recent Labs  Lab 05/19/21 0020 05/19/21 0339 05/20/21 0129  AST 46* 43* 46*  ALT 26 22 23   ALKPHOS 87 78 72  BILITOT 2.5* 1.7* 0.6  PROT 6.4* 5.6* 5.3*  ALBUMIN 3.8 3.2* 3.1*    Recent Labs  Lab 05/19/21 0020  LIPASE 101*    Recent Labs  Lab 05/19/21 0410  AMMONIA <10    Coagulation Profile: Recent Labs  Lab 05/19/21 0020  INR 0.9    Cardiac Enzymes: Recent Labs  Lab 05/19/21 0020  CKTOTAL 462*    BNP (last 3 results) No results for input(s): PROBNP in the last 8760 hours. HbA1C: No results for input(s): HGBA1C in the last 72 hours. CBG: Recent Labs  Lab 05/20/21 1941  GLUCAP 137*   Lipid Profile: No results for input(s): CHOL, HDL, LDLCALC, TRIG, CHOLHDL, LDLDIRECT in the last 72 hours. Thyroid Function Tests: No results for input(s): TSH, T4TOTAL, FREET4, T3FREE, THYROIDAB in the last 72 hours. Anemia Panel: Recent Labs    05/19/21 0907  VITAMINB12 164*  FERRITIN 119  TIBC 356  IRON 144    Sepsis Labs: Recent Labs  Lab 05/19/21 0004  LATICACIDVEN 1.3     Recent Results (from the past 240 hour(s))  Resp Panel by RT-PCR (Flu A&B, Covid) Nasopharyngeal Swab     Status: None   Collection Time: 05/19/21  4:40 AM   Specimen: Nasopharyngeal Swab; Nasopharyngeal(NP) swabs in vial transport medium  Result Value Ref Range Status   SARS Coronavirus 2 by RT PCR NEGATIVE NEGATIVE Final    Comment:  (NOTE) SARS-CoV-2 target nucleic acids are NOT DETECTED.  The SARS-CoV-2 RNA is generally detectable in upper respiratory specimens during the acute phase of infection. The lowest concentration of SARS-CoV-2 viral copies this assay can detect is 138 copies/mL. A negative result does not preclude SARS-Cov-2 infection and should not be used as the sole basis for treatment or other patient management decisions. A negative result may occur with  improper specimen collection/handling, submission of specimen other than nasopharyngeal swab, presence of viral mutation(s) within the areas targeted by this assay, and inadequate number of viral copies(<138 copies/mL). A negative result must be combined with clinical observations, patient history, and epidemiological information. The expected result is Negative.  Fact Sheet for Patients:  EntrepreneurPulse.com.au  Fact Sheet for Healthcare Providers:  IncredibleEmployment.be  This test is no t yet approved or cleared by the [place]  States FDA and  has been authorized for detection and/or diagnosis of SARS-CoV-2 by FDA under an Emergency Use Authorization (EUA). This EUA will remain  in effect (meaning this test can be used) for the duration of the COVID-19 declaration under Section 564(b)(1) of the Act, 21 U.S.C.section 360bbb-3(b)(1), unless the authorization is terminated  or revoked sooner.       Influenza A by PCR NEGATIVE NEGATIVE Final   Influenza B by PCR NEGATIVE NEGATIVE Final    Comment: (NOTE) The Xpert Xpress SARS-CoV-2/FLU/RSV plus assay is intended as an aid in the diagnosis of influenza from Nasopharyngeal swab specimens and should not be used as a sole basis for treatment. Nasal washings and aspirates are unacceptable for Xpert Xpress SARS-CoV-2/FLU/RSV testing.  Fact Sheet for Patients: EntrepreneurPulse.com.au  Fact Sheet for Healthcare  Providers: IncredibleEmployment.be  This test is not yet approved or cleared by the [place] FDA and has been authorized for detection and/or diagnosis of SARS-CoV-2 by FDA under an Emergency Use Authorization (EUA). This EUA will remain in effect (meaning this test can be used) for the duration of the COVID-19 declaration under Section 564(b)(1) of the Act, 21 U.S.C. section 360bbb-3(b)(1), unless the authorization is terminated or revoked.  Performed at [facility], [place] [address] [place], [place] [address]           Radiology Studies: No results found.      Scheduled Meds:  (feeding supplement) PROSource Plus  30 mL Oral BID BM   amLODipine  5 mg Oral Daily   cyanocobalamin  1,000 mcg Intramuscular Daily   docusate sodium  100 mg Oral BID   enoxaparin (LOVENOX) injection  30 mg Subcutaneous Daily   feeding supplement  1 Container Oral TID BM   fluticasone furoate-vilanterol  1 puff Inhalation Daily   folic acid  1 mg Oral Daily   levothyroxine  50 mcg Oral QAC breakfast   LORazepam  0-4 mg Intravenous Q8H   magnesium oxide  400 mg Oral BID   metoprolol tartrate  25 mg Oral BID   multivitamin with minerals  1 tablet Oral Daily   sodium bicarbonate  650 mg Oral BID   sodium chloride flush  3 mL Intravenous Q12H   thiamine  100 mg Oral Daily   Or   thiamine  100 mg Intravenous Daily   Continuous Infusions:  sodium chloride 100 mL/hr at 05/20/21 1458   magnesium sulfate bolus IVPB     sodium phosphate  Dextrose 5% IVPB       LOS: 2 days    Time spent: 35 minutes.     Elmarie Shiley, MD [facility]   If 7PM-7AM, please contact night-coverage [email or web address]  05/21/2021, 8:28 AM

## 2021-05-21 NOTE — Consult Note (Signed)
[place] [facility] Consult   Reason for Cosult:  "anxiety medication managenment for home, here with alcohol withdrawal." Referring Physician:  Dr. Tyrell Antonio Patient Identification: Stacy Dalton MRN:  [id number] Principal Diagnosis: Alcohol withdrawal ([place]) Diagnosis:  Principal Problem:   Alcohol withdrawal ([place]) Active Problems:   COPD (chronic obstructive pulmonary disease) ([place])   Hypertension   Chronic back pain   Depression   Hypothyroidism   Anxiety   AKI (acute kidney injury) ([place])   Elevated LFTs   Metabolic acidosis   Total Time spent with patient: 30 minutes  Subjective:   Stacy Dalton is a 70 y.o. female with history of alcohol use, anxiety and depression who is currently admitted for alcohol withdrawal.  On interview patient is found lying in bed in no acute distress.  She is calm, cooperative and pleasant.  She states that she came to the hospital because "I drink too much".  Patient states that the day that she came in she drank "a little bit of beer" but admits that the day before she drank a half a gallon of beer.  Patient reports that she has been drinking daily for several years.  She states that her husband passed away in 09-18-2012.  She describes coping with the loss by abusing tramadol and then later alcohol.  She states that from 2012-09-18 up until approximately 09-19-18 when COVID hit, she was attending grief meetings which she found to be helpful.  She does report that over the last year she does feel that her drinking has increased.  She reports recent stressor of her 2 grandkids aged 71 and 69 do not like their's new stepmother and that they often tell her (patient) that they wish the stepmother would leave the house and never returned.  Patient describes feeling very hurt by these comments and states that she prays for them.  She describes her mood is "pretty depressed".  She rates it as 6 or 7 out of 10 (10 being the best).  She reports associated depressive  symptoms of poor sleep,poor appetite and poor energy.  She does attribute some of these issues due to her substance use.  Patient states that she does feel that her substance use is a problem  And expresses interest in getting treatment.  She states that less than a year ago she attempted to go to [facility] to detox but describes the experience as "bad".  She states that she was informed by staff that she was going to get medication to help with her withdrawal symptoms but that she did not receive the medication and that she ultimately speak spiked a fever and then had to be transferred to [facility]  Patient states that she would not be open to going to [facility] again for treatment but would be interested in substance use treatment.  She denies SI/HI/AVH.  She states that she has a good support system and has "plenty of family and friends".  She describes her family as being extremely supportive apart from 1 daughter who she states has a drinking problem as well.  Discussed starting medication with patient-patient is hesitant.  Recommended Remeron 7.5 mg as this could assist with anxiety, depression and sleep.  Patient states that she would "like to think about it".  Past Psychiatric History: Previous Medication Trials: cymbalta, temazepam, nortriptyline, "all the over the counter stuff for sleep" Previous Psychiatric Hospitalizations: ~30-40 years ago. Denies that she was suicidal, homicidal or psychotic at this time. Unclear reason for  admission Previous Suicide Attempts: no History of Violence: no Outpatient psychiatrist: no  Social History: Marital Status: widow; per documentation, is currently engaged Children: 5-describes her children as supportive for her from 1 daughter whom she states has a drinking problem Housing Status: alone but states that she has a daughter who lives next door.  She also states that she has a friend who stays in the house with her the majority of the time. History  of phys/sexual abuse: no Easy access to gun: no  Substance Use (with emphasis over the last 12 months) Recreational Drugs: denied Use of Alcohol: heavy as per HPI Tobacco Use: no Rehab History: "years ago" H/O Complicated Withdrawal: denied history of seizures or DTs  Legal History: Past Charges/Incarcerations: yes, states that she got a DUI a couple weeks ago Pending charges: States that she was told that she has an upcoming court date related to DUI; however, does not know when this is  Family Psychiatric History: denies   HPI:   Stacy Dalton is a pleasant 70 y.o. female with medical history significant for severe alcohol dependence, COPD, hypertension, hypothyroidism, chronic pain, depression, and anxiety, now presenting to the emergency department with nausea, loss of appetite, tremors, and anxiety after having only 1 or 2 drinks yesterday.  Patient reports that she has been drinking more than 750 mL of liquor almost every day but has been wanting to quit.  She has had nausea with a few episodes of vomiting, loss of appetite, but no abdominal pain for the past couple days and only had 1 or 2 drinks yesterday.  She began to feel anxious and tremulous with worsening nausea yesterday, worsening throughout the course of the day, and eventually called EMS for transport to the hospital.  She denies any fevers, chills, change in her chronic cough, shortness of breath, or chest pain.  She reports that she was able to avoid alcohol for a few days after her last admission but began drinking again due to depression and because her fianc was drinking.  Past Psychiatric History: Depression, anxiety, alcohol use disorder  Risk to Self:   Risk to Others:   Prior Inpatient Therapy:   Prior Outpatient Therapy:    Past Medical History:  Past Medical History:  Diagnosis Date   Chronic pain    COPD (chronic obstructive pulmonary disease) ([place])    told has copd, no current inhaler use   Cough     Depression    ETOH abuse    GERD (gastroesophageal reflux disease)    Hypertension    Hypothyroidism    Insomnia    Migraine    Migraine    Osteopenia    Pain management    Panic attacks    Small bowel obstruction ([place])     Past Surgical History:  Procedure Laterality Date   ABDOMINAL HYSTERECTOMY     COLON SURGERY     COLOSTOMY CLOSURE  04/19/2012   Procedure: COLOSTOMY CLOSURE;  Surgeon: Adin Hector, MD;  Location: [facility];  Service: General;  Laterality: N/A;  Laparotomy, Resection and Closure of Colostomy   COLOSTOMY TAKEDOWN N/A 04/12/2016   Procedure: [place] TAKEDOWN;  Surgeon: Johnathan Hausen, MD;  Location: [facility];  Service: General;  Laterality: N/A;   INCONTINENCE SURGERY     LAPAROTOMY  10/04/2011, colostomy also   Procedure: EXPLORATORY LAPAROTOMY;  Surgeon: Adin Hector, MD;  Location: [facility];  Service: General;  Laterality: N/A;  left partial colectomy with colostomy  LAPAROTOMY  04/19/2012   Procedure: EXPLORATORY LAPAROTOMY;  Surgeon: Adin Hector, MD;  Location: [facility];  Service: General;  Laterality: N/A;   LAPAROTOMY N/A 11/29/2015   Procedure: EXPLORATORY LAPAROTOMY; SUBTOTAL COLECTOMY WITH HARTMAN PROCEDURE AND END COLOSTOMY;  Surgeon: Johnathan Hausen, MD;  Location: [facility];  Service: General;  Laterality: N/A;   TUBAL LIGATION     VENTRAL HERNIA REPAIR  04/19/2012   Procedure: HERNIA REPAIR VENTRAL ADULT;  Surgeon: Adin Hector, MD;  Location: [facility];  Service: General;  Laterality: N/A;   Family History:  Family History  Problem Relation Age of Onset   Heart disease Mother    Hypertension Mother    Cancer Sister        sinus   Diabetes Brother    Heart disease Brother    Thyroid disease Brother    Cancer Sister        abdominal ?   Thyroid disease Sister    Cancer Other        GE junction adenocarcinoma   Emphysema Father    Stroke Father    Hypertension Father    Heart disease Father    COPD Sister    Thyroid disease Sister     Thyroid disease Sister    Diabetes Brother    Thyroid disease Brother    Thyroid disease Brother    Hypertension Brother    Post-traumatic stress disorder Brother    COPD Brother    Thyroid disease Brother    Thyroid disease Brother    Hypertension Brother    Transient ischemic attack Brother    Family Psychiatric  History: Denies Social History:  Social History   Substance and Sexual Activity  Alcohol Use Yes   Comment: 1bottle vodka -3 days. As of 03/01/21 pt  states she has quit drinking"for several days."     Social History   Substance and Sexual Activity  Drug Use No    Social History   Socioeconomic History   Marital status: Widowed    Spouse name: Not on file   Number of children: 5   Years of education: 8   Highest education level: 8th grade  Occupational History   Occupation: Retired  Tobacco Use   Smoking status: Former    Packs/day: 1.50    Years: 20.00    Pack years: 30.00    Types: Cigarettes    Quit date: 10/04/1998    Years since quitting: 22.6   Smokeless tobacco: Never  Vaping Use   Vaping Use: Never used  Substance and Sexual Activity   Alcohol use: Yes    Comment: 1bottle vodka -3 days. As of 03/01/21 pt  states she has quit drinking"for several days."   Drug use: No   Sexual activity: Yes  Other Topics Concern   Not on file  Social History Narrative   Pt lives alone. States 2 children live near by.   Social Determinants of Health   Financial Resource Strain: Low Risk    Difficulty of Paying Living Expenses: Not very hard  Food Insecurity: No Food Insecurity   Worried About Charity fundraiser in the Last Year: Never true   Ran Out of Food in the Last Year: Never true  Transportation Needs: No Transportation Needs   Lack of Transportation (Medical): No   Lack of Transportation (Non-Medical): No  Physical Activity: Inactive   Days of Exercise per Week: 0 days   Minutes of Exercise per Session: 0 min  Stress: Stress Concern Present    Feeling of Stress : To some extent  Social Connections: Socially Isolated   Frequency of Communication with Friends and Family: Three times a week   Frequency of Social Gatherings with Friends and Family: Three times a week   Attends Religious Services: Never   Active Member of Clubs or Organizations: No   Attends Archivist Meetings: Never   Marital Status: Widowed   Additional Social History:    Allergies:   Allergies  Allergen Reactions   Librium [Chlordiazepoxide]     Became unresponsive   Toradol [Ketorolac Tromethamine] Shortness Of Breath   Butalbital-Apap-Caffeine Other (See Comments)    jittery   Demerol Swelling   Esgic [Butalbital-Apap-Caffeine] Other (See Comments)    jittery   Iodine Other (See Comments)    bp bottomed out per pt several hours later; unsure if pre medicated in past with cm; done in [place]    Labs:  Results for orders placed or performed during the hospital encounter of 05/18/21 (from the past 48 hour(s))  Comprehensive metabolic panel     Status: Abnormal   Collection Time: 05/20/21  1:29 AM  Result Value Ref Range   Sodium 134 (L) 135 - 145 mmol/L   Potassium 3.2 (L) 3.5 - 5.1 mmol/L   Chloride 100 98 - 111 mmol/L   CO2 26 22 - 32 mmol/L   Glucose, Bld 134 (H) 70 - 99 mg/dL    Comment: Glucose reference range applies only to samples taken after fasting for at least 8 hours.   BUN 7 (L) 8 - 23 mg/dL   Creatinine, Ser 0.77 0.44 - 1.00 mg/dL   Calcium 8.5 (L) 8.9 - 10.3 mg/dL   Total Protein 5.3 (L) 6.5 - 8.1 g/dL   Albumin 3.1 (L) 3.5 - 5.0 g/dL   AST 46 (H) 15 - 41 U/L   ALT 23 0 - 44 U/L   Alkaline Phosphatase 72 38 - 126 U/L   Total Bilirubin 0.6 0.3 - 1.2 mg/dL   GFR, Estimated >60 >60 mL/min    Comment: (NOTE) Calculated using the CKD-EPI Creatinine Equation (2021)    Anion gap 8 5 - 15    Comment: Performed at [facility], [place] [address], [place], [place] [address]  CBC     Status: Abnormal   Collection Time:  05/20/21  1:29 AM  Result Value Ref Range   WBC 5.1 4.0 - 10.5 K/uL   RBC 3.88 3.87 - 5.11 MIL/uL   Hemoglobin 10.6 (L) 12.0 - 15.0 g/dL   HCT 32.3 (L) 36.0 - 46.0 %   MCV 83.2 80.0 - 100.0 fL   MCH 27.3 26.0 - 34.0 pg   MCHC 32.8 30.0 - 36.0 g/dL   RDW 16.2 (H) 11.5 - 15.5 %   Platelets 138 (L) 150 - 400 K/uL   nRBC 0.0 0.0 - 0.2 %    Comment: Performed at [facility], [place] [address] [place], [place] [address]  Magnesium     Status: Abnormal   Collection Time: 05/20/21  1:29 AM  Result Value Ref Range   Magnesium 1.6 (L) 1.7 - 2.4 mg/dL    Comment: Performed at [facility] [address] [place], [place] [address]  VITAMIN D 25 Hydroxy (Vit-D Deficiency, Fractures)     Status: None   Collection Time: 05/20/21  5:44 PM  Result Value Ref Range   Vit D, 25-Hydroxy 38.42 30 -  100 ng/mL    Comment: (NOTE) Vitamin D deficiency has been defined by the [place] practice guideline as a level of serum 25-OH  vitamin D less than 20 ng/mL (1,2). The Endocrine Society went on to  further define vitamin D insufficiency as a level between 21 and 29  ng/mL (2).  1. IOM (Institute of Medicine). 2010. Dietary reference intakes for  calcium and D. [place]: The [employer]. 2. Holick MF, Binkley Bolivar, Bischoff-Ferrari HA, et al. Evaluation,  treatment, and prevention of vitamin D deficiency: an Endocrine  Society clinical practice guideline, JCEM. 2011 Jul; 96(7): 1911-30.  Performed at [facility], [place] [address], [place], [place] [address]   Glucose, capillary     Status: Abnormal   Collection Time: 05/20/21  7:41 PM  Result Value Ref Range   Glucose-Capillary 137 (H) 70 - 99 mg/dL    Comment: Glucose reference range applies only to samples taken after fasting for at least 8 hours.   Comment 1 Notify RN    Comment 2 Document in Chart   CBC     Status: Abnormal   Collection Time: 05/21/21  2:16 AM  Result  Value Ref Range   WBC 6.6 4.0 - 10.5 K/uL   RBC 3.70 (L) 3.87 - 5.11 MIL/uL   Hemoglobin 10.3 (L) 12.0 - 15.0 g/dL   HCT 31.4 (L) 36.0 - 46.0 %   MCV 84.9 80.0 - 100.0 fL   MCH 27.8 26.0 - 34.0 pg   MCHC 32.8 30.0 - 36.0 g/dL   RDW 16.0 (H) 11.5 - 15.5 %   Platelets 128 (L) 150 - 400 K/uL   nRBC 0.0 0.0 - 0.2 %    Comment: Performed at [facility], [place] [address], [place], [place] [address]  Basic metabolic panel     Status: Abnormal   Collection Time: 05/21/21  2:16 AM  Result Value Ref Range   Sodium 130 (L) 135 - 145 mmol/L   Potassium 4.0 3.5 - 5.1 mmol/L   Chloride 97 (L) 98 - 111 mmol/L   CO2 25 22 - 32 mmol/L   Glucose, Bld 117 (H) 70 - 99 mg/dL    Comment: Glucose reference range applies only to samples taken after fasting for at least 8 hours.   BUN 12 8 - 23 mg/dL   Creatinine, Ser 0.74 0.44 - 1.00 mg/dL   Calcium 8.6 (L) 8.9 - 10.3 mg/dL   GFR, Estimated >60 >60 mL/min    Comment: (NOTE) Calculated using the CKD-EPI Creatinine Equation (2021)    Anion gap 8 5 - 15    Comment: Performed at [facility] [address], [place], [place] [address]  Magnesium     Status: None   Collection Time: 05/21/21  2:16 AM  Result Value Ref Range   Magnesium 1.8 1.7 - 2.4 mg/dL    Comment: Performed at [facility] [address] [place], [place] [address]  Phosphorus     Status: Abnormal   Collection Time: 05/21/21  2:16 AM  Result Value Ref Range   Phosphorus 2.1 (L) 2.5 - 4.6 mg/dL    Comment: Performed at [facility] [address], [place], [place] [address]    Current Facility-Administered Medications  Medication Dose Route Frequency Provider Last Rate Last Admin   (feeding supplement) PROSource Plus liquid 30 mL  30 mL Oral BID BM Regalado, Belkys A,  MD   30 mL at 05/21/21 1357   0.9 %  sodium chloride infusion   Intravenous Continuous Regalado, Belkys A, MD 100 mL/hr at 05/20/21 1458 New Bag at 05/20/21 1458   acetaminophen (TYLENOL)  tablet 650 mg  650 mg Oral Q6H PRN Vianne Bulls, MD   650 mg at 05/21/21 0004   Or   acetaminophen (TYLENOL) suppository 650 mg  650 mg Rectal Q6H PRN Opyd, Ilene Qua, MD       albuterol (PROVENTIL) (2.5 MG/3ML) 0.083% nebulizer solution 2.5 mg  2.5 mg Inhalation Q4H PRN Opyd, Ilene Qua, MD       amLODipine (NORVASC) tablet 5 mg  5 mg Oral Daily Regalado, Belkys A, MD   5 mg at 05/21/21 0847   cyanocobalamin ((VITAMIN B-12)) injection 1,000 mcg  1,000 mcg Intramuscular Daily Regalado, Belkys A, MD   1,000 mcg at 05/21/21 0848   docusate sodium (COLACE) capsule 100 mg  100 mg Oral BID Regalado, Belkys A, MD   100 mg at 05/21/21 0847   [START ON 05/22/2021] enoxaparin (LOVENOX) injection 40 mg  40 mg Subcutaneous Daily Price, Austin S, RPH       feeding supplement (BOOST / RESOURCE BREEZE) liquid 1 Container  1 Container Oral TID BM Regalado, Belkys A, MD   1 Container at 05/21/21 1334   fluticasone furoate-vilanterol (BREO ELLIPTA) 100-25 MCG/ACT 1 puff  1 puff Inhalation Daily Opyd, Ilene Qua, MD   1 puff at 16/10/96 0454   folic acid (FOLVITE) tablet 1 mg  1 mg Oral Daily Opyd, Ilene Qua, MD   1 mg at 05/21/21 0847   levothyroxine (SYNTHROID) tablet 50 mcg  50 mcg Oral QAC breakfast Opyd, Ilene Qua, MD   50 mcg at 05/21/21 0847   LORazepam (ATIVAN) injection 0-4 mg  0-4 mg Intravenous Q8H Opyd, Ilene Qua, MD   2 mg at 05/21/21 1334   LORazepam (ATIVAN) tablet 1-4 mg  1-4 mg Oral Q1H PRN Vianne Bulls, MD   2 mg at 05/19/21 0981   Or   LORazepam (ATIVAN) injection 1-4 mg  1-4 mg Intravenous Q1H PRN Opyd, Ilene Qua, MD   2 mg at 05/21/21 1120   magnesium oxide (MAG-OX) tablet 400 mg  400 mg Oral BID Regalado, Belkys A, MD   400 mg at 05/21/21 0847   metoprolol tartrate (LOPRESSOR) tablet 25 mg  25 mg Oral BID Vianne Bulls, MD   25 mg at 05/21/21 0847   multivitamin with minerals tablet 1 tablet  1 tablet Oral Daily Opyd, Ilene Qua, MD   1 tablet at 05/21/21 0847   ondansetron (ZOFRAN)  tablet 4 mg  4 mg Oral Q6H PRN Opyd, Ilene Qua, MD       Or   ondansetron (ZOFRAN) injection 4 mg  4 mg Intravenous Q6H PRN Opyd, Ilene Qua, MD   4 mg at 05/19/21 2046   senna-docusate (Senokot-S) tablet 1 tablet  1 tablet Oral QHS PRN Opyd, Ilene Qua, MD       sodium chloride flush (NS) 0.9 % injection 3 mL  3 mL Intravenous Q12H Opyd, Ilene Qua, MD   3 mL at 05/21/21 0854   sodium phosphate 15 mmol in dextrose 5 % 250 mL infusion  15 mmol Intravenous Once Regalado, Belkys A, MD 43 mL/hr at 05/21/21 1019 15 mmol at 05/21/21 1019   thiamine tablet 100 mg  100 mg Oral Daily Opyd, Ilene Qua, MD   100 mg at 05/21/21  0847   Or   thiamine (B-1) injection 100 mg  100 mg Intravenous Daily Opyd, Ilene Qua, MD       traMADol (ULTRAM) tablet 50 mg  50 mg Oral Q6H PRN Regalado, Belkys A, MD   50 mg at 05/21/21 0603    Musculoskeletal: Strength & Muscle Tone:  did not assess; patient laying in bed Gait & Station:  did not assess; patient laying in bed Patient leans:  did not assess; patient laying in bed            Psychiatric Specialty Exam:  Presentation  General Appearance: Appropriate for Environment; Casual  Eye Contact:Good  Speech:Clear and Coherent; Normal Rate  Speech Volume:Normal  Handedness:No data recorded  Mood and Affect  Mood:-- ("pretty depressed")  Affect:Appropriate; Tearful; Congruent; Other (comment) (appropriatetly tearful when discussing stressors)   Thought Process  Thought Processes:Coherent; Goal Directed; Linear  Descriptions of Associations:Intact  Orientation:Full (Time, Place and Person)  Thought Content:WDL; Logical  History of Schizophrenia/Schizoaffective disorder:No  Duration of Psychotic Symptoms:No data recorded Hallucinations:Hallucinations: None  Ideas of Reference:None  Suicidal Thoughts:Suicidal Thoughts: No  Homicidal Thoughts:Homicidal Thoughts: No   Sensorium  Memory:Immediate Good; Recent Good; Remote  Good  Judgment:Fair  Insight:Fair   Executive Functions  Concentration:Good  Attention Span:Good  [place] of Knowledge:Good  Language:Good   Psychomotor Activity  Psychomotor Activity:Psychomotor Activity: Normal   Assets  Assets:Communication Skills; Desire for Improvement; Resilience; Social Support; Housing   Sleep  Sleep:Sleep: Poor   Physical Exam: Physical Exam Constitutional:      Appearance: Normal appearance.     Comments: Thin   Neurological:     General: No focal deficit present.     Mental Status: She is alert.   Review of Systems  Psychiatric/Behavioral:  Positive for depression and substance abuse. Negative for hallucinations and suicidal ideas. The patient is nervous/anxious and has insomnia.   Blood pressure 140/85, pulse 81, temperature 97.9 F (36.6 C), temperature source Oral, resp. rate 16, height 5\' 1"  (1.549 m), weight 50.9 kg, SpO2 96 %. Body mass index is 21.2 kg/m.  Treatment Plan Summary: AFOMIA BLACKLEY is a 70 y.o. female with history of alcohol use, anxiety and depression who is currently admitted for alcohol withdrawal. Patient requested to speak with psychiatry re: medication options for anxiety.  Patient reports depression, anxiety and associated depressive symptoms of decreased appetite and difficulty sleeping.  Patient's biggest concern appears to be difficulty sleeping.  She denies SI/HI/AVH.  Discussed with patient options for medications-recommend Remeron 7.5 mg nightly to assist with sleep, mood and anxiety.  Patient states that she would like to "think about it".  She expresses desire for substance use treatment upon discharge from the hospital.  Dx:  Alcohol use disorder, severe Anxiety SIMD vs MDD  Recommendations -start Remeron 7.5 mg qhs for mood, anxiety if patient is agreeable-will also assist with sleep and appetite -consult TOC/SW for outpatient substance use options-if she is agreeable to starting  remeron she will also need referral for outpatient psychiatry.   -recommendations communicated to primary team via secure chat -Psychiatry will sign off at this time  Disposition: No evidence of imminent risk to self or others at present.   Supportive therapy provided about ongoing stressors.  Ival Bible, MD 05/21/2021 2:11 PM

## 2021-05-21 NOTE — Discharge Summary (Signed)
Physician Discharge Summary  Stacy Dalton:[id number] DOB: [birth year] DOA: 05/18/2021  PCP: [name], FNP  Admit date: 05/18/2021 Discharge date: 05/21/2021  Admitted From: Home  Disposition:  Left AMA  Recommendations for Outpatient Follow-up:  Follow up with PCP in 1-2 weeks Please obtain BMP/CBC in one week Needs repeat B 12 level.   Brief/Interim Summary: 70 -year-old with past medical history significant for alcohol dependence, COPD, hypertension, hypothyroidism, chronic pain, depression, anxiety presents complaining of nausea, tremors, loss of appetite, she has been drinking 1 or 2 drinks yesterday that day prior to admission.  She drinks more than 750 mL of alcohol every day.  She wants to quit drinking alcohol. Patient was admitted for alcohol withdrawal.  Patient admitted for alcohol withdrawal, she received IV fluids, she was on Ativan, CIWA protocol was followed.  Patient improved.  Patient decides to leave AMA.  I provided prescription for B12 and other vitamin supplements.   1-Acute Alcohol Withdrawal: -Patient with history of severe alcohol dependence presented with tremors, nausea, anxiety.  He has been reducing the amount of alcohol that she has been drinking. -Continue with Ativan and CIWA protocol -Continue with thiamine and folic acid. -CIWA score: 14, continue with ativan.    2-Anxiety;  She is asking for medication management for anxiety for when she is discharge home. Will ask Psych to see and evaluate.    3-AKI, metabolic acidosis: Hyponatremia.  Creatinine baseline 0.7-0.8.  Scented with a creatinine of 1.4.  Bicarb 14. -Continue with IV fluids -Metabolic acidosis resolved. Discontinue sodium bicarb tablet.  -AKI resolved.      3-Transaminases, hyperbilirubinemia In the setting of alcohol use Viral hepatitis panel: Non Reactive.  Trending down Resolving.    4-COPD: Continue with ICS/LABA   5-Hypertension: Continue with metoprolol.  BP elevated, will add Norvasc.  6-Hypothyroidism: Continue with Synthroid 7-SIRS: Presented with leukocytosis and mild tachycardia, tachypnea Suspect related to alcohol withdrawal Hypophosphatemia; Replete IV>  Hypomagnesemia; replete IV> and oral.  Hypokalemia. Replaced.     B12 deficiency:  B 12: 164. Started  IM B12 supplement.    Chronic Pain; on tramadol. She will need to follow up with PCP for pain management.    Estimated body mass index is 21.2 kg/m as calculated from the following:   Height as of this encounter: 5\' 1"  (1.549 m).   Weight as of this encounter: 50.9 kg.    Discharge Diagnoses:  Principal Problem:   Alcohol withdrawal ([place]) Active Problems:   COPD (chronic obstructive pulmonary disease) ([facility])   Hypertension   Chronic back pain   Depression   Hypothyroidism   Anxiety   AKI (acute kidney injury) ([place])   Elevated LFTs   Metabolic acidosis    Discharge Instructions   Allergies as of 05/21/2021       Reactions   Librium [chlordiazepoxide]    Became unresponsive   Toradol [ketorolac Tromethamine] Shortness Of Breath   Butalbital-apap-caffeine Other (See Comments)   jittery   Demerol Swelling   Esgic [butalbital-apap-caffeine] Other (See Comments)   jittery   Iodine Other (See Comments)   bp bottomed out per pt several hours later; unsure if pre medicated in past with cm; done in [place]        Medication List     STOP taking these medications    DULoxetine 60 MG capsule Commonly known as: CYMBALTA   gabapentin 100 MG capsule Commonly known as: NEURONTIN       TAKE these medications  amLODipine 5 MG tablet Commonly known as: NORVASC Take 1 tablet (5 mg total) by mouth daily. Start taking on: May 22, 2021   famotidine 20 MG tablet Commonly known as: PEPCID Take 20 mg by mouth 2 (two) times daily.   ferrous sulfate 325 (65 FE) MG tablet Take 1 tablet (325 mg total) by mouth daily with breakfast.    fluticasone-salmeterol 250-50 MCG/ACT Aepb Commonly known as: Advair Diskus Inhale 1 puff into the lungs in the morning and at bedtime.   folic acid 1 MG tablet Commonly known as: FOLVITE Take 1 tablet (1 mg total) by mouth daily. Start taking on: May 22, 2021   levothyroxine 50 MCG tablet Commonly known as: Synthroid Take 1 tablet (50 mcg total) by mouth daily before breakfast.   metoprolol tartrate 25 MG tablet Commonly known as: LOPRESSOR Take 1 tablet (25 mg total) by mouth 2 (two) times daily.   multivitamin with minerals Tabs tablet Take 1 tablet by mouth daily. Start taking on: May 22, 2021   ondansetron 4 MG tablet Commonly known as: Zofran Take 1 tablet (4 mg total) by mouth every 8 (eight) hours as needed for nausea or vomiting.   thiamine 100 MG tablet Take 1 tablet (100 mg total) by mouth daily. Start taking on: May 22, 2021   traMADol 50 MG tablet Commonly known as: ULTRAM Take 50 mg by mouth every 6 (six) hours as needed for pain. for pain   vitamin B-12 1000 MCG tablet Commonly known as: CYANOCOBALAMIN Take 1 tablet (1,000 mcg total) by mouth daily.   Vitamin D (Ergocalciferol) 1.25 MG (50000 UNIT) Caps capsule Commonly known as: DRISDOL Take 1 capsule (50,000 Units total) by mouth every 7 (seven) days.        Follow-up Information     [facility] Follow up.   Why: The home health agency will contact you for the first home visit Contact information: [phone]               Allergies  Allergen Reactions   Librium [Chlordiazepoxide]     Became unresponsive   Toradol [Ketorolac Tromethamine] Shortness Of Breath   Butalbital-Apap-Caffeine Other (See Comments)    jittery   Demerol Swelling   Esgic [Butalbital-Apap-Caffeine] Other (See Comments)    jittery   Iodine Other (See Comments)    bp bottomed out per pt several hours later; unsure if pre medicated in past with cm; done in [place]     Consultations: Psych   Procedures/Studies: No results found.   Subjective: She was feeling better, she wanted to talk with psych about her anxiety medication for it.  Psych saw her and patient wanted to think about taking medication for anxiety  Discharge Exam: Vitals:   05/21/21 0832 05/21/21 1112  BP: (!) 148/99 140/85  Pulse: 87 81  Resp: 16 16  Temp: 97.8 F (36.6 C) 97.9 F (36.6 C)  SpO2: 95% 96%     General: Pt is alert, awake, not in acute distress Cardiovascular: RRR, S1/S2 +, no rubs, no gallops Respiratory: CTA bilaterally, no wheezing, no rhonchi Abdominal: Soft, NT, ND, bowel sounds + Extremities: no edema, no cyanosis    The results of significant diagnostics from this hospitalization (including imaging, microbiology, ancillary and laboratory) are listed below for reference.     Microbiology: Recent Results (from the past 240 hour(s))  Resp Panel by RT-PCR (Flu A&B, Covid) Nasopharyngeal Swab     Status: None   Collection Time: 05/19/21  4:40 AM   Specimen: Nasopharyngeal Swab; Nasopharyngeal(NP) swabs in vial transport medium  Result Value Ref Range Status   SARS Coronavirus 2 by RT PCR NEGATIVE NEGATIVE Final    Comment: (NOTE) SARS-CoV-2 target nucleic acids are NOT DETECTED.  The SARS-CoV-2 RNA is generally detectable in upper respiratory specimens during the acute phase of infection. The lowest concentration of SARS-CoV-2 viral copies this assay can detect is 138 copies/mL. A negative result does not preclude SARS-Cov-2 infection and should not be used as the sole basis for treatment or other patient management decisions. A negative result may occur with  improper specimen collection/handling, submission of specimen other than nasopharyngeal swab, presence of viral mutation(s) within the areas targeted by this assay, and inadequate number of viral copies(<138 copies/mL). A negative result must be combined with clinical observations,  patient history, and epidemiological information. The expected result is Negative.  Fact Sheet for Patients:  EntrepreneurPulse.com.au  Fact Sheet for Healthcare Providers:  IncredibleEmployment.be  This test is no t yet approved or cleared by the [place] FDA and  has been authorized for detection and/or diagnosis of SARS-CoV-2 by FDA under an Emergency Use Authorization (EUA). This EUA will remain  in effect (meaning this test can be used) for the duration of the COVID-19 declaration under Section 564(b)(1) of the Act, 21 U.S.C.section 360bbb-3(b)(1), unless the authorization is terminated  or revoked sooner.       Influenza A by PCR NEGATIVE NEGATIVE Final   Influenza B by PCR NEGATIVE NEGATIVE Final    Comment: (NOTE) The Xpert Xpress SARS-CoV-2/FLU/RSV plus assay is intended as an aid in the diagnosis of influenza from Nasopharyngeal swab specimens and should not be used as a sole basis for treatment. Nasal washings and aspirates are unacceptable for Xpert Xpress SARS-CoV-2/FLU/RSV testing.  Fact Sheet for Patients: EntrepreneurPulse.com.au  Fact Sheet for Healthcare Providers: IncredibleEmployment.be  This test is not yet approved or cleared by the [place] FDA and has been authorized for detection and/or diagnosis of SARS-CoV-2 by FDA under an Emergency Use Authorization (EUA). This EUA will remain in effect (meaning this test can be used) for the duration of the COVID-19 declaration under Section 564(b)(1) of the Act, 21 U.S.C. section 360bbb-3(b)(1), unless the authorization is terminated or revoked.  Performed at [facility], [place] [address], [place], [place] [address]      Labs: BNP (last 3 results) Recent Labs    04/14/21 0842  BNP 02.6   Basic Metabolic Panel: Recent Labs  Lab 05/19/21 0020 05/19/21 0339 05/20/21 0129 05/21/21 0216  NA 134* 132* 134* 130*  K  3.4* 3.4* 3.2* 4.0  CL 97* 100 100 97*  CO2 14* 16* 26 25  GLUCOSE 110* 98 134* 117*  BUN 24* 20 7* 12  CREATININE 1.49* 1.02* 0.77 0.74  CALCIUM 8.3* 7.7* 8.5* 8.6*  MG  --  1.3* 1.6* 1.8  PHOS  --  2.4*  --  2.1*   Liver Function Tests: Recent Labs  Lab 05/19/21 0020 05/19/21 0339 05/20/21 0129  AST 46* 43* 46*  ALT 26 22 23   ALKPHOS 87 78 72  BILITOT 2.5* 1.7* 0.6  PROT 6.4* 5.6* 5.3*  ALBUMIN 3.8 3.2* 3.1*   Recent Labs  Lab 05/19/21 0020  LIPASE 101*   Recent Labs  Lab 05/19/21 0410  AMMONIA <10   CBC: Recent Labs  Lab 05/19/21 0020 05/19/21 0339 05/20/21 0129 05/21/21 0216  WBC 13.4* 10.2 5.1 6.6  HGB 11.2* 9.9* 10.6*  10.3*  HCT 33.7* 30.3* 32.3* 31.4*  MCV 83.6 83.2 83.2 84.9  PLT 214 172 138* 128*   Cardiac Enzymes: Recent Labs  Lab 05/19/21 0020  CKTOTAL 462*   BNP: Invalid input(s): POCBNP CBG: Recent Labs  Lab 05/20/21 1941  GLUCAP 137*   D-Dimer No results for input(s): DDIMER in the last 72 hours. Hgb A1c No results for input(s): HGBA1C in the last 72 hours. Lipid Profile No results for input(s): CHOL, HDL, LDLCALC, TRIG, CHOLHDL, LDLDIRECT in the last 72 hours. Thyroid function studies No results for input(s): TSH, T4TOTAL, T3FREE, THYROIDAB in the last 72 hours.  Invalid input(s): FREET3 Anemia work up Recent Labs    05/19/21 0907  VITAMINB12 164*  FERRITIN 119  TIBC 356  IRON 144   Urinalysis    Component Value Date/Time   COLORURINE YELLOW 05/19/2021 0516   APPEARANCEUR CLEAR 05/19/2021 0516   APPEARANCEUR Cloudy (A) 02/23/2021 1608   LABSPEC 1.012 05/19/2021 0516   PHURINE 5.0 05/19/2021 0516   GLUCOSEU NEGATIVE 05/19/2021 0516   HGBUR MODERATE (A) 05/19/2021 0516   BILIRUBINUR NEGATIVE 05/19/2021 0516   BILIRUBINUR Negative 02/23/2021 1608   KETONESUR 80 (A) 05/19/2021 0516   PROTEINUR NEGATIVE 05/19/2021 0516   UROBILINOGEN negative 07/01/2015 1541   UROBILINOGEN 0.2 10/07/2012 1928   NITRITE NEGATIVE  05/19/2021 0516   LEUKOCYTESUR NEGATIVE 05/19/2021 0516   Sepsis Labs Invalid input(s): PROCALCITONIN,  WBC,  LACTICIDVEN Microbiology Recent Results (from the past 240 hour(s))  Resp Panel by RT-PCR (Flu A&B, Covid) Nasopharyngeal Swab     Status: None   Collection Time: 05/19/21  4:40 AM   Specimen: Nasopharyngeal Swab; Nasopharyngeal(NP) swabs in vial transport medium  Result Value Ref Range Status   SARS Coronavirus 2 by RT PCR NEGATIVE NEGATIVE Final    Comment: (NOTE) SARS-CoV-2 target nucleic acids are NOT DETECTED.  The SARS-CoV-2 RNA is generally detectable in upper respiratory specimens during the acute phase of infection. The lowest concentration of SARS-CoV-2 viral copies this assay can detect is 138 copies/mL. A negative result does not preclude SARS-Cov-2 infection and should not be used as the sole basis for treatment or other patient management decisions. A negative result may occur with  improper specimen collection/handling, submission of specimen other than nasopharyngeal swab, presence of viral mutation(s) within the areas targeted by this assay, and inadequate number of viral copies(<138 copies/mL). A negative result must be combined with clinical observations, patient history, and epidemiological information. The expected result is Negative.  Fact Sheet for Patients:  EntrepreneurPulse.com.au  Fact Sheet for Healthcare Providers:  IncredibleEmployment.be  This test is no t yet approved or cleared by the [place] FDA and  has been authorized for detection and/or diagnosis of SARS-CoV-2 by FDA under an Emergency Use Authorization (EUA). This EUA will remain  in effect (meaning this test can be used) for the duration of the COVID-19 declaration under Section 564(b)(1) of the Act, 21 U.S.C.section 360bbb-3(b)(1), unless the authorization is terminated  or revoked sooner.       Influenza A by PCR NEGATIVE NEGATIVE  Final   Influenza B by PCR NEGATIVE NEGATIVE Final    Comment: (NOTE) The Xpert Xpress SARS-CoV-2/FLU/RSV plus assay is intended as an aid in the diagnosis of influenza from Nasopharyngeal swab specimens and should not be used as a sole basis for treatment. Nasal washings and aspirates are unacceptable for Xpert Xpress SARS-CoV-2/FLU/RSV testing.  Fact Sheet for Patients: EntrepreneurPulse.com.au  Fact Sheet for Healthcare Providers: IncredibleEmployment.be  This test  is not yet approved or cleared by the [place] and has been authorized for detection and/or diagnosis of SARS-CoV-2 by FDA under an Emergency Use Authorization (EUA). This EUA will remain in effect (meaning this test can be used) for the duration of the COVID-19 declaration under Section 564(b)(1) of the Act, 21 U.S.C. section 360bbb-3(b)(1), unless the authorization is terminated or revoked.  Performed at [facility], [place] [address], [place], [place] [address]      Time coordinating discharge: 40 minutes  SIGNED:   Elmarie Shiley, MD  [facility]

## 2021-05-23 ENCOUNTER — Other Ambulatory Visit: Payer: Self-pay

## 2021-05-23 ENCOUNTER — Ambulatory Visit (INDEPENDENT_AMBULATORY_CARE_PROVIDER_SITE_OTHER): Payer: Medicare Other

## 2021-05-23 DIAGNOSIS — J9601 Acute respiratory failure with hypoxia: Secondary | ICD-10-CM | POA: Diagnosis not present

## 2021-05-23 DIAGNOSIS — J449 Chronic obstructive pulmonary disease, unspecified: Secondary | ICD-10-CM | POA: Diagnosis not present

## 2021-05-23 DIAGNOSIS — E785 Hyperlipidemia, unspecified: Secondary | ICD-10-CM

## 2021-05-23 DIAGNOSIS — Z87891 Personal history of nicotine dependence: Secondary | ICD-10-CM

## 2021-05-23 DIAGNOSIS — K219 Gastro-esophageal reflux disease without esophagitis: Secondary | ICD-10-CM

## 2021-05-23 DIAGNOSIS — F32A Depression, unspecified: Secondary | ICD-10-CM

## 2021-05-23 DIAGNOSIS — F1020 Alcohol dependence, uncomplicated: Secondary | ICD-10-CM | POA: Diagnosis not present

## 2021-05-23 DIAGNOSIS — Z9181 History of falling: Secondary | ICD-10-CM

## 2021-05-23 DIAGNOSIS — E039 Hypothyroidism, unspecified: Secondary | ICD-10-CM

## 2021-05-23 DIAGNOSIS — Z79891 Long term (current) use of opiate analgesic: Secondary | ICD-10-CM

## 2021-05-23 DIAGNOSIS — F419 Anxiety disorder, unspecified: Secondary | ICD-10-CM

## 2021-05-23 DIAGNOSIS — I10 Essential (primary) hypertension: Secondary | ICD-10-CM

## 2021-05-23 DIAGNOSIS — J690 Pneumonitis due to inhalation of food and vomit: Secondary | ICD-10-CM | POA: Diagnosis not present

## 2021-05-24 ENCOUNTER — Other Ambulatory Visit: Payer: Self-pay

## 2021-05-24 ENCOUNTER — Encounter (HOSPITAL_COMMUNITY): Payer: Self-pay | Admitting: Emergency Medicine

## 2021-05-24 ENCOUNTER — Emergency Department (HOSPITAL_COMMUNITY)
Admission: EM | Admit: 2021-05-24 | Discharge: 2021-05-24 | Disposition: A | Discharge status: Home / Self Care | Payer: Medicare Other | Attending: Emergency Medicine | Admitting: Emergency Medicine

## 2021-05-24 DIAGNOSIS — Z87891 Personal history of nicotine dependence: Secondary | ICD-10-CM | POA: Insufficient documentation

## 2021-05-24 DIAGNOSIS — E876 Hypokalemia: Secondary | ICD-10-CM | POA: Insufficient documentation

## 2021-05-24 DIAGNOSIS — Z20822 Contact with and (suspected) exposure to covid-19: Secondary | ICD-10-CM | POA: Insufficient documentation

## 2021-05-24 DIAGNOSIS — Y908 Blood alcohol level of 240 mg/100 ml or more: Secondary | ICD-10-CM | POA: Insufficient documentation

## 2021-05-24 DIAGNOSIS — F10120 Alcohol abuse with intoxication, uncomplicated: Secondary | ICD-10-CM | POA: Insufficient documentation

## 2021-05-24 DIAGNOSIS — J449 Chronic obstructive pulmonary disease, unspecified: Secondary | ICD-10-CM | POA: Diagnosis not present

## 2021-05-24 DIAGNOSIS — Z79899 Other long term (current) drug therapy: Secondary | ICD-10-CM | POA: Diagnosis not present

## 2021-05-24 DIAGNOSIS — I10 Essential (primary) hypertension: Secondary | ICD-10-CM | POA: Diagnosis not present

## 2021-05-24 DIAGNOSIS — E039 Hypothyroidism, unspecified: Secondary | ICD-10-CM | POA: Insufficient documentation

## 2021-05-24 DIAGNOSIS — F10920 Alcohol use, unspecified with intoxication, uncomplicated: Secondary | ICD-10-CM

## 2021-05-24 DIAGNOSIS — Z8616 Personal history of COVID-19: Secondary | ICD-10-CM | POA: Insufficient documentation

## 2021-05-24 LAB — CBC WITH DIFFERENTIAL/PLATELET
Abs Immature Granulocytes: 0.05 10*3/uL (ref 0.00–0.07)
Basophils Absolute: 0 10*3/uL (ref 0.0–0.1)
Basophils Relative: 1 %
Eosinophils Absolute: 0.1 10*3/uL (ref 0.0–0.5)
Eosinophils Relative: 2 %
HCT: 30.8 % — ABNORMAL LOW (ref 36.0–46.0)
Hemoglobin: 10 g/dL — ABNORMAL LOW (ref 12.0–15.0)
Immature Granulocytes: 1 %
Lymphocytes Relative: 27 %
Lymphs Abs: 1.6 10*3/uL (ref 0.7–4.0)
MCH: 28.2 pg (ref 26.0–34.0)
MCHC: 32.5 g/dL (ref 30.0–36.0)
MCV: 87 fL (ref 80.0–100.0)
Monocytes Absolute: 0.7 10*3/uL (ref 0.1–1.0)
Monocytes Relative: 12 %
Neutro Abs: 3.4 10*3/uL (ref 1.7–7.7)
Neutrophils Relative %: 57 %
Platelets: 220 10*3/uL (ref 150–400)
RBC: 3.54 MIL/uL — ABNORMAL LOW (ref 3.87–5.11)
RDW: 16.9 % — ABNORMAL HIGH (ref 11.5–15.5)
WBC: 5.8 10*3/uL (ref 4.0–10.5)
nRBC: 0 % (ref 0.0–0.2)

## 2021-05-24 LAB — BASIC METABOLIC PANEL
Anion gap: 11 (ref 5–15)
BUN: 18 mg/dL (ref 8–23)
CO2: 23 mmol/L (ref 22–32)
Calcium: 9.3 mg/dL (ref 8.9–10.3)
Chloride: 107 mmol/L (ref 98–111)
Creatinine, Ser: 0.82 mg/dL (ref 0.44–1.00)
GFR, Estimated: >60 mL/min (ref >60)
Glucose, Bld: 103 mg/dL — ABNORMAL HIGH (ref 70–99)
Potassium: 3.3 mmol/L — ABNORMAL LOW (ref 3.5–5.1)
Sodium: 141 mmol/L (ref 135–145)

## 2021-05-24 LAB — URINALYSIS, ROUTINE W REFLEX MICROSCOPIC
Bilirubin Urine: NEGATIVE
Glucose, UA: NEGATIVE mg/dL
Hgb urine dipstick: NEGATIVE
Ketones, ur: NEGATIVE mg/dL
Leukocytes,Ua: NEGATIVE
Nitrite: NEGATIVE
Protein, ur: NEGATIVE mg/dL
Specific Gravity, Urine: 1.002 — ABNORMAL LOW (ref 1.005–1.030)
pH: 6 (ref 5.0–8.0)

## 2021-05-24 LAB — ETHANOL: Alcohol, Ethyl (B): 261 mg/dL — ABNORMAL HIGH (ref <10)

## 2021-05-24 LAB — RESP PANEL BY RT-PCR (FLU A&B, COVID) ARPGX2
Influenza A by PCR: NEGATIVE
Influenza B by PCR: NEGATIVE
SARS Coronavirus 2 by RT PCR: NEGATIVE

## 2021-05-24 LAB — MAGNESIUM: Magnesium: 1.6 mg/dL — ABNORMAL LOW (ref 1.7–2.4)

## 2021-05-24 LAB — TROPONIN I (HIGH SENSITIVITY): Troponin I (High Sensitivity): 8 ng/L (ref <18)

## 2021-05-24 MED ORDER — LORAZEPAM 2 MG/ML IJ SOLN
1.0000 mg | Freq: Once | INTRAMUSCULAR | Status: AC
  Administered 2021-05-24: 1 mg via INTRAVENOUS
  Filled 2021-05-24: qty 1

## 2021-05-24 MED ORDER — MAGNESIUM OXIDE 400 MG PO CAPS: 400.0000 mg | ORAL_CAPSULE | Freq: Every day | ORAL | 0 refills | Status: AC

## 2021-05-24 NOTE — ED Notes (Signed)
Pt was able to ambulate without any assistance.

## 2021-05-24 NOTE — Discharge Instructions (Addendum)
We talked about your continued use of alcohol, and how this will be damaging to your health.  I would strongly encourage you to seek a rehab to quit drinking.  Ultimately the choice to stop drinking will be your own.  You should make an effort to cut the amount you drink in half every single day, on a taper over the next 7 days.    Your magnesium level was low, likely related to your drinking.  I prescribed a magnesium pills which she should take once daily for the next 30 days.

## 2021-05-24 NOTE — ED Triage Notes (Signed)
Pt arrived via [facility]. Pt called EMS of unspecified pain. ETOH onboard.

## 2021-05-24 NOTE — ED Provider Notes (Signed)
[facility] EMERGENCY DEPARTMENT Provider Note   CSN: [id number] Arrival date & time: 05/24/21  0706     History CC:  Anxiety, etoh problem   Stacy Dalton is a 70 y.o. female with history of chronic alcohol use presenting to the ED with unclear medical complaint.  The patient reports that she "screwed up and started drinking again".  She had just been discharged from the hospital earlier this week after being managed for acute alcohol withdrawal.  She reports she continues to drink vodka at home.  She states that her sister is a Education officer, museum is trying to get her placed in a rehab.  When asked why she came to the ER, she says "I just need something to help with anxiety".  I asked if she was in any specific pain and she said no.  HPI     Past Medical History:  Diagnosis Date   Chronic pain    COPD (chronic obstructive pulmonary disease) ([place])    told has copd, no current inhaler use   Cough    Depression    ETOH abuse    GERD (gastroesophageal reflux disease)    Hypertension    Hypothyroidism    Insomnia    Migraine    Migraine    Osteopenia    Pain management    Panic attacks    Small bowel obstruction (HCC)     Patient Active Problem List   Diagnosis Date Noted   AKI (acute kidney injury) ([place]) 05/19/2021   Elevated LFTs 02/63/7858   Metabolic acidosis 85/08/7739   COVID-19 virus infection 04/15/2021    Class: Acute   Hypotensive episode 04/15/2021    Class: Acute   Physical deconditioning    Vitamin B12 deficiency 02/04/2021   Leukocytosis 11/23/2020   Alcohol use disorder, severe, dependence ([place]) 10/08/2020   Hyponatremia 09/17/2020   Alcohol withdrawal (HCC) 09/12/2020   Chronic bronchitis ([place]) 08/20/2020   Moderate protein malnutrition (HCC) 08/20/2020   Chronic low back pain 08/19/2020   Elevated SGOT ([place]) 08/19/2020   Elevated troponin 08/19/2020   Increased serum lipase level 08/19/2020   Large bowel obstruction (HCC)    Small bowel  obstruction ([place]) 07/20/2018   Hypomagnesemia 07/20/2018   Cough 03/14/2018   Polypharmacy 02/23/2017   Hypokalemia 28/78/6767   Toxic metabolic encephalopathy 20/94/7096   Anxiety 02/22/2017   GERD (gastroesophageal reflux disease) 02/22/2017   Pain medication agreement signed 10/10/2016   Opioid dependence ([place]) 10/10/2016   Status post colostomy takedown 04/12/2016   Chronic constipation 12/03/2015   Peritonitis with abscess of intestine ([place]) 11/29/2015   COPD exacerbation ([place]) 09/20/2015   Osteopenia 06/10/2014   Vitamin D deficiency 06/10/2014   Hypothyroidism    Depression 11/18/2012   Insomnia 11/18/2012   Chronic pain syndrome 11/18/2012   HLD (hyperlipidemia) 09/01/2012   S/P colostomy ([place]) 04/19/2012   Normocytic anemia 10/15/2011   Hypertension 10/13/2011   Chronic back pain 10/13/2011   COPD (chronic obstructive pulmonary disease) ([place]) 10/09/2011    Past Surgical History:  Procedure Laterality Date   ABDOMINAL HYSTERECTOMY     COLON SURGERY     COLOSTOMY CLOSURE  04/19/2012   Procedure: COLOSTOMY CLOSURE;  Surgeon: Adin Hector, MD;  Location: [facility];  Service: General;  Laterality: N/A;  Laparotomy, Resection and Closure of Colostomy   COLOSTOMY TAKEDOWN N/A 04/12/2016   Procedure: [place] TAKEDOWN;  Surgeon: Johnathan Hausen, MD;  Location: [facility];  Service: General;  Laterality: N/A;   INCONTINENCE SURGERY  LAPAROTOMY  10/04/2011, colostomy also   Procedure: EXPLORATORY LAPAROTOMY;  Surgeon: Adin Hector, MD;  Location: [facility];  Service: General;  Laterality: N/A;  left partial colectomy with colostomy   LAPAROTOMY  04/19/2012   Procedure: EXPLORATORY LAPAROTOMY;  Surgeon: Adin Hector, MD;  Location: [facility];  Service: General;  Laterality: N/A;   LAPAROTOMY N/A 11/29/2015   Procedure: EXPLORATORY LAPAROTOMY; SUBTOTAL COLECTOMY WITH HARTMAN PROCEDURE AND END COLOSTOMY;  Surgeon: Johnathan Hausen, MD;  Location: [facility];  Service: General;   Laterality: N/A;   TUBAL LIGATION     VENTRAL HERNIA REPAIR  04/19/2012   Procedure: HERNIA REPAIR VENTRAL ADULT;  Surgeon: Adin Hector, MD;  Location: [facility];  Service: General;  Laterality: N/A;     OB History     Gravida      Para      Term      Preterm      AB      Living  5      SAB      IAB      Ectopic      Multiple      Live Births              Family History  Problem Relation Age of Onset   Heart disease Mother    Hypertension Mother    Cancer Sister        sinus   Diabetes Brother    Heart disease Brother    Thyroid disease Brother    Cancer Sister        abdominal ?   Thyroid disease Sister    Cancer Other        GE junction adenocarcinoma   Emphysema Father    Stroke Father    Hypertension Father    Heart disease Father    COPD Sister    Thyroid disease Sister    Thyroid disease Sister    Diabetes Brother    Thyroid disease Brother    Thyroid disease Brother    Hypertension Brother    Post-traumatic stress disorder Brother    COPD Brother    Thyroid disease Brother    Thyroid disease Brother    Hypertension Brother    Transient ischemic attack Brother     Social History   Tobacco Use   Smoking status: Former    Packs/day: 1.50    Years: 20.00    Pack years: 30.00    Types: Cigarettes    Quit date: 10/04/1998    Years since quitting: 22.6   Smokeless tobacco: Never  Vaping Use   Vaping Use: Never used  Substance Use Topics   Alcohol use: Yes    Comment: 1bottle vodka -3 days. As of 03/01/21 pt  states she has quit drinking"for several days."   Drug use: No    Home Medications Prior to Admission medications   Medication Sig Start Date End Date Taking? Authorizing Provider  Magnesium Oxide 400 MG CAPS Take 1 capsule (400 mg total) by mouth daily. 05/24/21 06/23/21 Yes Lynnell Fiumara, Carola Rhine, MD  amLODipine (NORVASC) 5 MG tablet Take 1 tablet (5 mg total) by mouth daily. 05/22/21   Regalado, Belkys A, MD  famotidine  (PEPCID) 20 MG tablet Take 20 mg by mouth 2 (two) times daily.    [provider]  ferrous sulfate 325 (65 FE) MG tablet Take 1 tablet (325 mg total) by mouth daily with breakfast. 04/20/21 05/20/21  Deatra James, MD  fluticasone-salmeterol (ADVAIR DISKUS) 250-50 MCG/ACT AEPB Inhale 1 puff into the lungs in the morning and at bedtime. 04/19/21 05/20/21  Shahmehdi, Valeria Batman, MD  folic acid (FOLVITE) 1 MG tablet Take 1 tablet (1 mg total) by mouth daily. 05/22/21   Regalado, Jerald Kief A, MD  levothyroxine (SYNTHROID) 50 MCG tablet Take 1 tablet (50 mcg total) by mouth daily before breakfast. 06/22/20   Evelina Dun A, FNP  metoprolol tartrate (LOPRESSOR) 25 MG tablet Take 1 tablet (25 mg total) by mouth 2 (two) times daily. 04/19/21 05/20/21  Deatra James, MD  Multiple Vitamin (MULTIVITAMIN WITH MINERALS) TABS tablet Take 1 tablet by mouth daily. 05/22/21   Regalado, Belkys A, MD  ondansetron (ZOFRAN) 4 MG tablet Take 1 tablet (4 mg total) by mouth every 8 (eight) hours as needed for nausea or vomiting. 03/24/21   Evelina Dun A, FNP  thiamine 100 MG tablet Take 1 tablet (100 mg total) by mouth daily. 05/22/21   Regalado, Belkys A, MD  traMADol (ULTRAM) 50 MG tablet Take 50 mg by mouth every 6 (six) hours as needed for pain. for pain Patient not taking: Reported on 05/20/2021 04/29/21   [provider]  vitamin B-12 (CYANOCOBALAMIN) 1000 MCG tablet Take 1 tablet (1,000 mcg total) by mouth daily. 05/21/21   Regalado, Belkys A, MD  Vitamin D, Ergocalciferol, (DRISDOL) 1.25 MG (50000 UNIT) CAPS capsule Take 1 capsule (50,000 Units total) by mouth every 7 (seven) days. 06/22/20   [name], FNP    Allergies    Librium [chlordiazepoxide], Toradol [ketorolac tromethamine], Butalbital-apap-caffeine, Demerol, Esgic [butalbital-apap-caffeine], and Iodine  Review of Systems   Review of Systems  Constitutional:  Negative for chills and fever.  HENT:  Negative for ear pain  and sore throat.   Eyes:  Negative for pain and visual disturbance.  Respiratory:  Negative for cough and shortness of breath.   Cardiovascular:  Negative for chest pain and palpitations.  Gastrointestinal:  Negative for abdominal pain and vomiting.  Genitourinary:  Negative for dysuria and hematuria.  Musculoskeletal:  Negative for arthralgias and back pain.  Skin:  Negative for color change and rash.  Neurological:  Negative for syncope and headaches.  Psychiatric/Behavioral:  Negative for confusion.   All other systems reviewed and are negative.  Physical Exam Updated Vital Signs BP 131/70   Pulse (!) 101   Temp 98 F (36.7 C) (Oral)   Resp 18   Ht 5\' 1"  (1.549 m)   Wt 50.8 kg   SpO2 100%   BMI 21.16 kg/m   Physical Exam Constitutional:      General: She is not in acute distress. HENT:     Head: Normocephalic and atraumatic.  Eyes:     Conjunctiva/sclera: Conjunctivae normal.     Pupils: Pupils are equal, round, and reactive to light.  Cardiovascular:     Rate and Rhythm: Normal rate and regular rhythm.  Pulmonary:     Effort: Pulmonary effort is normal. No respiratory distress.  Abdominal:     General: There is no distension.     Tenderness: There is no abdominal tenderness.  Skin:    General: Skin is warm and dry.  Neurological:     General: No focal deficit present.     Mental Status: She is alert and oriented to person, place, and time. Mental status is at baseline.     Comments: No fascilations or trembling  Psychiatric:        Mood and Affect: Mood normal.  Behavior: Behavior normal.    ED Results / Procedures / Treatments   Labs (all labs ordered are listed, but only abnormal results are displayed) Labs Reviewed  BASIC METABOLIC PANEL - Abnormal; Notable for the following components:      Result Value   Potassium 3.3 (*)    Glucose, Bld 103 (*)    All other components within normal limits  CBC WITH DIFFERENTIAL/PLATELET - Abnormal; Notable  for the following components:   RBC 3.54 (*)    Hemoglobin 10.0 (*)    HCT 30.8 (*)    RDW 16.9 (*)    All other components within normal limits  ETHANOL - Abnormal; Notable for the following components:   Alcohol, Ethyl (B) 261 (*)    All other components within normal limits  URINALYSIS, ROUTINE W REFLEX MICROSCOPIC - Abnormal; Notable for the following components:   Color, Urine COLORLESS (*)    Specific Gravity, Urine 1.002 (*)    All other components within normal limits  MAGNESIUM - Abnormal; Notable for the following components:   Magnesium 1.6 (*)    All other components within normal limits  RESP PANEL BY RT-PCR (FLU A&B, COVID) ARPGX2  TROPONIN I (HIGH SENSITIVITY)    EKG None  Radiology No results found.  Procedures Procedures   Medications Ordered in ED Medications  LORazepam (ATIVAN) injection 1 mg (1 mg Intravenous Given 05/24/21 0849)    ED Course  I have reviewed the triage vital signs and the nursing notes.  Pertinent labs & imaging results that were available during my care of the patient were reviewed by me and considered in my medical decision making (see chart for details).  Anxiety, etoh intoxication  Vitals are stable.  I personally reviewed her prior medical records including recent hospitalization.  Mild hypokalemia and hypomagnesemia here.  Otherwise her labs are unremarkable, aside from elevated alcohol level.  This all appears to be chronic.  Unfortunately we cannot fix her alcohol dependency in the ED, and I am not certain how we can help her.  I encouraged her to seek outpatient detox or rehab, and I do not believe that she is in acute withdrawal requiring hospitalization at this time.  The patient repea reported that she was ready to leave the department, and was felt to be reasonably safe and stable to do so.  Clinical Course as of 05/24/21 1443  Tue May 24, 2021  1034 Patient is wishing to leave and someone to stay longer in the ER.  She  was able to ambulate steadily to the bathroom.  They do believe that she is sober enough to make this medical decision.  Her alcohol level is elevated but she is a chronic alcohol consumer, likely has a high tolerance, does not appear grossly intoxicated in the ER.  I explained to her verbally as well as in her discharge papers that the decision to quit drinking will ultimately be her own.  We cannot fix this in the emergency department or through hospitalization.  We talked about tapering down her alcohol consumption.  She apparently has adverse reactions to Librium, and I would not prescribe Ativan, as I am concerned significantly about polypharmacy issues with her.  I strongly encouraged her to seek a detox or rehab. [MT]    Clinical Course User Index [MT] Nakyra Bourn, Carola Rhine, MD   Final Clinical Impression(s) / ED Diagnoses Final diagnoses:  Alcoholic intoxication without complication ([place])  Hypomagnesemia    Rx / DC Orders ED  Discharge Orders          Ordered    Magnesium Oxide 400 MG CAPS  Daily        05/24/21 1034             Wyvonnia Dusky, MD 05/24/21 1443

## 2021-05-25 ENCOUNTER — Emergency Department (HOSPITAL_COMMUNITY)
Admission: EM | Admit: 2021-05-25 | Discharge: 2021-05-26 | Disposition: A | Discharge status: Home / Self Care | Payer: Medicare Other | Attending: Emergency Medicine | Admitting: Emergency Medicine

## 2021-05-25 ENCOUNTER — Other Ambulatory Visit: Payer: Self-pay

## 2021-05-25 DIAGNOSIS — R000 Tachycardia, unspecified: Secondary | ICD-10-CM | POA: Diagnosis not present

## 2021-05-25 DIAGNOSIS — R45851 Suicidal ideations: Secondary | ICD-10-CM | POA: Diagnosis not present

## 2021-05-25 DIAGNOSIS — J441 Chronic obstructive pulmonary disease with (acute) exacerbation: Secondary | ICD-10-CM | POA: Diagnosis not present

## 2021-05-25 DIAGNOSIS — E039 Hypothyroidism, unspecified: Secondary | ICD-10-CM | POA: Insufficient documentation

## 2021-05-25 DIAGNOSIS — I10 Essential (primary) hypertension: Secondary | ICD-10-CM | POA: Insufficient documentation

## 2021-05-25 DIAGNOSIS — Z79899 Other long term (current) drug therapy: Secondary | ICD-10-CM | POA: Insufficient documentation

## 2021-05-25 DIAGNOSIS — Z87891 Personal history of nicotine dependence: Secondary | ICD-10-CM | POA: Insufficient documentation

## 2021-05-25 DIAGNOSIS — F1020 Alcohol dependence, uncomplicated: Secondary | ICD-10-CM | POA: Diagnosis present

## 2021-05-25 DIAGNOSIS — F1010 Alcohol abuse, uncomplicated: Secondary | ICD-10-CM

## 2021-05-25 DIAGNOSIS — Y908 Blood alcohol level of 240 mg/100 ml or more: Secondary | ICD-10-CM | POA: Diagnosis not present

## 2021-05-25 DIAGNOSIS — Z8616 Personal history of COVID-19: Secondary | ICD-10-CM | POA: Diagnosis not present

## 2021-05-25 LAB — CBC WITH DIFFERENTIAL/PLATELET
Abs Immature Granulocytes: 0.06 10*3/uL (ref 0.00–0.07)
Basophils Absolute: 0 10*3/uL (ref 0.0–0.1)
Basophils Relative: 1 %
Eosinophils Absolute: 0.2 10*3/uL (ref 0.0–0.5)
Eosinophils Relative: 2 %
HCT: 30.8 % — ABNORMAL LOW (ref 36.0–46.0)
Hemoglobin: 10.1 g/dL — ABNORMAL LOW (ref 12.0–15.0)
Immature Granulocytes: 1 %
Lymphocytes Relative: 30 %
Lymphs Abs: 1.9 10*3/uL (ref 0.7–4.0)
MCH: 28.7 pg (ref 26.0–34.0)
MCHC: 32.8 g/dL (ref 30.0–36.0)
MCV: 87.5 fL (ref 80.0–100.0)
Monocytes Absolute: 0.7 10*3/uL (ref 0.1–1.0)
Monocytes Relative: 11 %
Neutro Abs: 3.6 10*3/uL (ref 1.7–7.7)
Neutrophils Relative %: 55 %
Platelets: 273 10*3/uL (ref 150–400)
RBC: 3.52 MIL/uL — ABNORMAL LOW (ref 3.87–5.11)
RDW: 17.4 % — ABNORMAL HIGH (ref 11.5–15.5)
WBC: 6.5 10*3/uL (ref 4.0–10.5)
nRBC: 0 % (ref 0.0–0.2)

## 2021-05-25 LAB — COMPREHENSIVE METABOLIC PANEL
ALT: 38 U/L (ref 0–44)
AST: 51 U/L — ABNORMAL HIGH (ref 15–41)
Albumin: 3.7 g/dL (ref 3.5–5.0)
Alkaline Phosphatase: 77 U/L (ref 38–126)
Anion gap: 8 (ref 5–15)
BUN: 19 mg/dL (ref 8–23)
CO2: 26 mmol/L (ref 22–32)
Calcium: 9.2 mg/dL (ref 8.9–10.3)
Chloride: 106 mmol/L (ref 98–111)
Creatinine, Ser: 0.73 mg/dL (ref 0.44–1.00)
GFR, Estimated: >60 mL/min (ref >60)
Glucose, Bld: 96 mg/dL (ref 70–99)
Potassium: 3.5 mmol/L (ref 3.5–5.1)
Sodium: 140 mmol/L (ref 135–145)
Total Bilirubin: 0.4 mg/dL (ref 0.3–1.2)
Total Protein: 6.2 g/dL — ABNORMAL LOW (ref 6.5–8.1)

## 2021-05-25 LAB — ACETAMINOPHEN LEVEL: Acetaminophen (Tylenol), Serum: <10 ug/mL — ABNORMAL LOW (ref 10–30)

## 2021-05-25 LAB — MAGNESIUM: Magnesium: 1.7 mg/dL (ref 1.7–2.4)

## 2021-05-25 LAB — ZINC: Zinc: 58 ug/dL (ref 44–115)

## 2021-05-25 LAB — ETHANOL: Alcohol, Ethyl (B): 328 mg/dL (ref <10)

## 2021-05-25 LAB — SALICYLATE LEVEL: Salicylate Lvl: <7 mg/dL — ABNORMAL LOW (ref 7.0–30.0)

## 2021-05-25 LAB — VITAMIN C: Vitamin C: 0.4 mg/dL (ref 0.4–2.0)

## 2021-05-25 MED ORDER — ONDANSETRON HCL 4 MG PO TABS
4.0000 mg | ORAL_TABLET | Freq: Three times a day (TID) | ORAL | Status: DC | PRN
  Administered 2021-05-26: 4 mg via ORAL
  Filled 2021-05-25: qty 1

## 2021-05-25 MED ORDER — LORAZEPAM 2 MG/ML IJ SOLN: 0.0000 mg | Freq: Two times a day (BID) | INTRAMUSCULAR | Status: DC

## 2021-05-25 MED ORDER — LORAZEPAM 1 MG PO TABS: 0.0000 mg | ORAL_TABLET | Freq: Two times a day (BID) | ORAL | Status: DC

## 2021-05-25 MED ORDER — SODIUM CHLORIDE 0.9 % IV BOLUS: 1000.0000 mL | Freq: Once | INTRAVENOUS | Status: DC

## 2021-05-25 MED ORDER — LORAZEPAM 2 MG/ML IJ SOLN: 0.0000 mg | Freq: Four times a day (QID) | INTRAMUSCULAR | Status: DC

## 2021-05-25 MED ORDER — ACETAMINOPHEN 325 MG PO TABS: 650.0000 mg | ORAL_TABLET | ORAL | Status: DC | PRN

## 2021-05-25 MED ORDER — LORAZEPAM 1 MG PO TABS
0.0000 mg | ORAL_TABLET | Freq: Four times a day (QID) | ORAL | Status: DC
  Administered 2021-05-25 – 2021-05-26 (×3): 1 mg via ORAL
  Filled 2021-05-25 (×3): qty 1

## 2021-05-25 MED ORDER — THIAMINE HCL 100 MG/ML IJ SOLN: 100.0000 mg | Freq: Every day | INTRAMUSCULAR | Status: DC

## 2021-05-25 MED ORDER — TRAMADOL HCL 50 MG PO TABS
50.0000 mg | ORAL_TABLET | Freq: Four times a day (QID) | ORAL | Status: DC | PRN
  Administered 2021-05-25 – 2021-05-26 (×3): 50 mg via ORAL
  Filled 2021-05-25 (×3): qty 1

## 2021-05-25 MED ORDER — ALUM & MAG HYDROXIDE-SIMETH 200-200-20 MG/5ML PO SUSP: 30.0000 mL | Freq: Four times a day (QID) | ORAL | Status: DC | PRN

## 2021-05-25 MED ORDER — THIAMINE HCL 100 MG PO TABS
100.0000 mg | ORAL_TABLET | Freq: Every day | ORAL | Status: DC
  Administered 2021-05-25 – 2021-05-26 (×2): 100 mg via ORAL
  Filled 2021-05-25 (×2): qty 1

## 2021-05-25 NOTE — ED Triage Notes (Addendum)
Pt arrives with sister for suicidal ideation while intoxicated. In triage states "I want to die, please let me." Sister noticed 2 fifths in the trash. Lives alone states there is guns in the home. Not coherent enough to share specific plan. Takes pain medication which her daughter takes from her home per sister resulting in her drinking to treat chronic pain

## 2021-05-25 NOTE — ED Notes (Signed)
Pt may eat/drink whatever she would like Per Almyra Free, MD

## 2021-05-25 NOTE — ED Notes (Signed)
Telesitter machine in room and called

## 2021-05-25 NOTE — ED Notes (Signed)
Pt screaming out requesting more ativan and a ginger ale; EDP made aware

## 2021-05-25 NOTE — BH Assessment (Signed)
Clinician messaged Chalmers Guest, RN: "Hey. It's Trey with TTS. Is the pt able to be assessed. Also is the pt under IVC?"    Clinician awaiting response.    Vertell Novak, [place], [facility], [facility] Triage Specialist [phone]

## 2021-05-25 NOTE — ED Notes (Signed)
Spoke with telesitter and gave report

## 2021-05-25 NOTE — ED Provider Notes (Signed)
[facility] EMERGENCY DEPARTMENT Provider Note   CSN: [id number] Arrival date & time: 05/25/21  1535     History Chief Complaint  Patient presents with   Suicidal    Stacy Dalton is a 70 y.o. female.  Pt presents to the ED today with SI.  The pt called her sister and told her that she wanted to die.  Pt has a hx of alcohol abuse.  Pt started drinking heavily about 2 years ago when she was taken off her chronic pain meds that she was getting from multiple providers.  Pt's main complaint now is that she wants an egg sandwich.  Pt was here yesterday for alcohol abuse.      Past Medical History:  Diagnosis Date   Chronic pain    COPD (chronic obstructive pulmonary disease) ([place])    told has copd, no current inhaler use   Cough    Depression    ETOH abuse    GERD (gastroesophageal reflux disease)    Hypertension    Hypothyroidism    Insomnia    Migraine    Migraine    Osteopenia    Pain management    Panic attacks    Small bowel obstruction (HCC)     Patient Active Problem List   Diagnosis Date Noted   AKI (acute kidney injury) ([place]) 05/19/2021   Elevated LFTs 99/24/2683   Metabolic acidosis 41/96/2229   COVID-19 virus infection 04/15/2021    Class: Acute   Hypotensive episode 04/15/2021    Class: Acute   Physical deconditioning    Vitamin B12 deficiency 02/04/2021   Leukocytosis 11/23/2020   Alcohol use disorder, severe, dependence ([place]) 10/08/2020   Hyponatremia 09/17/2020   Alcohol withdrawal (HCC) 09/12/2020   Chronic bronchitis ([place]) 08/20/2020   Moderate protein malnutrition (HCC) 08/20/2020   Chronic low back pain 08/19/2020   Elevated SGOT ([facility]) 08/19/2020   Elevated troponin 08/19/2020   Increased serum lipase level 08/19/2020   Large bowel obstruction (HCC)    Small bowel obstruction ([place]) 07/20/2018   Hypomagnesemia 07/20/2018   Cough 03/14/2018   Polypharmacy 02/23/2017   Hypokalemia 79/89/2119   Toxic metabolic encephalopathy  41/74/0814   Anxiety 02/22/2017   GERD (gastroesophageal reflux disease) 02/22/2017   Pain medication agreement signed 10/10/2016   Opioid dependence ([place]) 10/10/2016   Status post colostomy takedown 04/12/2016   Chronic constipation 12/03/2015   Peritonitis with abscess of intestine ([place]) 11/29/2015   COPD exacerbation ([place]) 09/20/2015   Osteopenia 06/10/2014   Vitamin D deficiency 06/10/2014   Hypothyroidism    Depression 11/18/2012   Insomnia 11/18/2012   Chronic pain syndrome 11/18/2012   HLD (hyperlipidemia) 09/01/2012   S/P colostomy ([place]) 04/19/2012   Normocytic anemia 10/15/2011   Hypertension 10/13/2011   Chronic back pain 10/13/2011   COPD (chronic obstructive pulmonary disease) ([place]) 10/09/2011    Past Surgical History:  Procedure Laterality Date   ABDOMINAL HYSTERECTOMY     COLON SURGERY     COLOSTOMY CLOSURE  04/19/2012   Procedure: COLOSTOMY CLOSURE;  Surgeon: Adin Hector, MD;  Location: [facility];  Service: General;  Laterality: N/A;  Laparotomy, Resection and Closure of Colostomy   COLOSTOMY TAKEDOWN N/A 04/12/2016   Procedure: [place] TAKEDOWN;  Surgeon: Johnathan Hausen, MD;  Location: [facility];  Service: General;  Laterality: N/A;   INCONTINENCE SURGERY     LAPAROTOMY  10/04/2011, colostomy also   Procedure: EXPLORATORY LAPAROTOMY;  Surgeon: Adin Hector, MD;  Location: [facility];  Service: General;  Laterality: N/A;  left partial colectomy with colostomy   LAPAROTOMY  04/19/2012   Procedure: EXPLORATORY LAPAROTOMY;  Surgeon: Adin Hector, MD;  Location: [facility];  Service: General;  Laterality: N/A;   LAPAROTOMY N/A 11/29/2015   Procedure: EXPLORATORY LAPAROTOMY; SUBTOTAL COLECTOMY WITH HARTMAN PROCEDURE AND END COLOSTOMY;  Surgeon: Johnathan Hausen, MD;  Location: [facility];  Service: General;  Laterality: N/A;   TUBAL LIGATION     VENTRAL HERNIA REPAIR  04/19/2012   Procedure: HERNIA REPAIR VENTRAL ADULT;  Surgeon: Adin Hector, MD;  Location: [facility];   Service: General;  Laterality: N/A;     OB History     Gravida      Para      Term      Preterm      AB      Living  5      SAB      IAB      Ectopic      Multiple      Live Births              Family History  Problem Relation Age of Onset   Heart disease Mother    Hypertension Mother    Cancer Sister        sinus   Diabetes Brother    Heart disease Brother    Thyroid disease Brother    Cancer Sister        abdominal ?   Thyroid disease Sister    Cancer Other        GE junction adenocarcinoma   Emphysema Father    Stroke Father    Hypertension Father    Heart disease Father    COPD Sister    Thyroid disease Sister    Thyroid disease Sister    Diabetes Brother    Thyroid disease Brother    Thyroid disease Brother    Hypertension Brother    Post-traumatic stress disorder Brother    COPD Brother    Thyroid disease Brother    Thyroid disease Brother    Hypertension Brother    Transient ischemic attack Brother     Social History   Tobacco Use   Smoking status: Former    Packs/day: 1.50    Years: 20.00    Pack years: 30.00    Types: Cigarettes    Quit date: 10/04/1998    Years since quitting: 22.6   Smokeless tobacco: Never  Vaping Use   Vaping Use: Never used  Substance Use Topics   Alcohol use: Yes    Comment: 1bottle vodka -3 days. As of 03/01/21 pt  states she has quit drinking"for several days."   Drug use: No    Home Medications Prior to Admission medications   Medication Sig Start Date End Date Taking? Authorizing Provider  amLODipine (NORVASC) 5 MG tablet Take 1 tablet (5 mg total) by mouth daily. 05/22/21   Regalado, Belkys A, MD  famotidine (PEPCID) 20 MG tablet Take 20 mg by mouth 2 (two) times daily.    [provider]  ferrous sulfate 325 (65 FE) MG tablet Take 1 tablet (325 mg total) by mouth daily with breakfast. 04/20/21 05/20/21  Shahmehdi, Valeria Batman, MD  fluticasone-salmeterol (ADVAIR DISKUS) 250-50 MCG/ACT  AEPB Inhale 1 puff into the lungs in the morning and at bedtime. 04/19/21 05/20/21  Shahmehdi, Valeria Batman, MD  folic acid (FOLVITE) 1 MG tablet Take 1 tablet (1 mg total) by mouth daily. 05/22/21   Regalado,  Belkys A, MD  levothyroxine (SYNTHROID) 50 MCG tablet Take 1 tablet (50 mcg total) by mouth daily before breakfast. 06/22/20   Evelina Dun A, FNP  Magnesium Oxide 400 MG CAPS Take 1 capsule (400 mg total) by mouth daily. 05/24/21 06/23/21  Wyvonnia Dusky, MD  metoprolol tartrate (LOPRESSOR) 25 MG tablet Take 1 tablet (25 mg total) by mouth 2 (two) times daily. 04/19/21 05/20/21  Deatra James, MD  Multiple Vitamin (MULTIVITAMIN WITH MINERALS) TABS tablet Take 1 tablet by mouth daily. 05/22/21   Regalado, Belkys A, MD  ondansetron (ZOFRAN) 4 MG tablet Take 1 tablet (4 mg total) by mouth every 8 (eight) hours as needed for nausea or vomiting. 03/24/21   Evelina Dun A, FNP  thiamine 100 MG tablet Take 1 tablet (100 mg total) by mouth daily. 05/22/21   Regalado, Belkys A, MD  traMADol (ULTRAM) 50 MG tablet Take 50 mg by mouth every 6 (six) hours as needed for pain. for pain Patient not taking: Reported on 05/20/2021 04/29/21   [provider]  vitamin B-12 (CYANOCOBALAMIN) 1000 MCG tablet Take 1 tablet (1,000 mcg total) by mouth daily. 05/21/21   Regalado, Belkys A, MD  Vitamin D, Ergocalciferol, (DRISDOL) 1.25 MG (50000 UNIT) CAPS capsule Take 1 capsule (50,000 Units total) by mouth every 7 (seven) days. 06/22/20   [name], FNP    Allergies    Librium [chlordiazepoxide], Toradol [ketorolac tromethamine], Butalbital-apap-caffeine, Demerol, Esgic [butalbital-apap-caffeine], and Iodine  Review of Systems   Review of Systems  Psychiatric/Behavioral:  Positive for suicidal ideas.    Physical Exam Updated Vital Signs BP 128/78 (BP Location: Right Arm)   Pulse 88   Temp 98.7 F (37.1 C)   Resp 16   SpO2 98%   Physical Exam Vitals and nursing note reviewed.   Constitutional:      Appearance: Normal appearance.  HENT:     Head: Normocephalic and atraumatic.     Right Ear: External ear normal.     Left Ear: External ear normal.     Nose: Nose normal.     Mouth/Throat:     Mouth: Mucous membranes are dry.  Eyes:     Extraocular Movements: Extraocular movements intact.     Conjunctiva/sclera: Conjunctivae normal.     Pupils: Pupils are equal, round, and reactive to light.  Cardiovascular:     Rate and Rhythm: Regular rhythm. Tachycardia present.     Pulses: Normal pulses.     Heart sounds: Normal heart sounds.  Pulmonary:     Effort: Pulmonary effort is normal.     Breath sounds: Normal breath sounds.  Abdominal:     General: Abdomen is flat. Bowel sounds are normal.     Palpations: Abdomen is soft.  Musculoskeletal:        General: Normal range of motion.     Cervical back: Normal range of motion and neck supple.  Skin:    General: Skin is warm.     Capillary Refill: Capillary refill takes less than 2 seconds.  Neurological:     General: No focal deficit present.     Mental Status: She is alert and oriented to person, place, and time.  Psychiatric:        Mood and Affect: Mood normal.        Thought Content: Thought content includes suicidal ideation.    ED Results / Procedures / Treatments   Labs (all labs ordered are listed, but only abnormal results are displayed) Labs  Reviewed  COMPREHENSIVE METABOLIC PANEL - Abnormal; Notable for the following components:      Result Value   Total Protein 6.2 (*)    AST 51 (*)    All other components within normal limits  ETHANOL - Abnormal; Notable for the following components:   Alcohol, Ethyl (B) 328 (*)    All other components within normal limits  CBC WITH DIFFERENTIAL/PLATELET - Abnormal; Notable for the following components:   RBC 3.52 (*)    Hemoglobin 10.1 (*)    HCT 30.8 (*)    RDW 17.4 (*)    All other components within normal limits  SALICYLATE LEVEL - Abnormal;  Notable for the following components:   Salicylate Lvl <7.3 (*)    All other components within normal limits  ACETAMINOPHEN LEVEL - Abnormal; Notable for the following components:   Acetaminophen (Tylenol), Serum <10 (*)    All other components within normal limits  RESP PANEL BY RT-PCR (FLU A&B, COVID) ARPGX2  MAGNESIUM  RAPID URINE DRUG SCREEN, HOSP PERFORMED  URINALYSIS, ROUTINE W REFLEX MICROSCOPIC    EKG None  Radiology No results found.  Procedures Procedures   Medications Ordered in ED Medications  LORazepam (ATIVAN) injection 0-4 mg (has no administration in time range)    Or  LORazepam (ATIVAN) tablet 0-4 mg (has no administration in time range)  LORazepam (ATIVAN) injection 0-4 mg (has no administration in time range)    Or  LORazepam (ATIVAN) tablet 0-4 mg (has no administration in time range)  thiamine tablet 100 mg (has no administration in time range)    Or  thiamine (B-1) injection 100 mg (has no administration in time range)  acetaminophen (TYLENOL) tablet 650 mg (has no administration in time range)  ondansetron (ZOFRAN) tablet 4 mg (has no administration in time range)  alum & mag hydroxide-simeth (MAALOX/MYLANTA) 200-200-20 MG/5ML suspension 30 mL (has no administration in time range)    ED Course  I have reviewed the triage vital signs and the nursing notes.  Pertinent labs & imaging results that were available during my care of the patient were reviewed by me and considered in my medical decision making (see chart for details).    MDM Rules/Calculators/A&P                           Initial HR documented were very high.  Those were not correct.  HR is 88 and she is not showing any signs of withdrawal now.  She is at high risk for DTs, so she is put on the CIWA protocol.  She is medically clear for psych eval.  Final Clinical Impression(s) / ED Diagnoses Final diagnoses:  Suicidal ideation  Alcohol abuse    Rx / DC Orders ED Discharge  Orders     None        Isla Pence, MD 05/25/21 [phone]

## 2021-05-25 NOTE — ED Notes (Signed)
Family updated as to patient's status.

## 2021-05-25 NOTE — BH Assessment (Signed)
Comprehensive Clinical Assessment (CCA) Note  05/25/2021 Stacy Dalton [id number]  Disposition: Lindon Romp, PMHNP recommends pt to be observed and reassessed by psychiatry. Disposition discussed with Chalmers Guest, RN.   The patient demonstrates the following risk factors for suicide: Chronic risk factors for suicide include: substance use disorder. Acute risk factors for suicide include:  Per RN note, "in triage states "I want to die, please let me." . Protective factors for this patient include: religious beliefs against suicide. Considering these factors, the overall suicide risk at this point appears to be not filed. Patient is appropriate for outpatient follow up.  Stacy Dalton is a 70 year old female who presents voluntary and unaccompanied to [place]. Clinician asked the pt, "what brought you to the hospital?" Pt reports, "I drink too much." Pt reports, she probably called her sister (she doesn't her often) who brought her to the hospital. Pt reports, asked her sister to get her help with getting into a rehab facility. Pt reports, she does not want to be gone all night and all day because she had a two dogs at home and one is sick. Pt vehemently denies being suicidal. Pt reports, she fears death and has trouble facing the fact the she is going to die one day. Pt reports, she gave her late husbands guns away. Pt also denies, previous suicide attempts, HI, AVH, self-injurious behaviors and access to weapons.  Per Spink. H[place]ycutt, RN: "Pt arrives with sister for suicidal ideation while intoxicated. In triage states "I want to die, please let me." Sister noticed 2 fifths in the trash. Lives alone states there is guns in the home. Not coherent enough to share specific plan. Takes pain medication which her daughter takes from her home per sister resulting in her drinking to treat chronic pain."   Pt reports, she has back pain she was prescribed Tramadol but just started heavily drinking  she was not able get her medications. Pt reports, she can drink everyday. Pt's BAL was 328 at 1721. Per chart, pt was medically admitted to [facility] for alcohol withdraw from 05/18/2021-05/21/2021 but left AMA. Pt's UDS is pending. Pt denies, being linked to OPT resources (medication management and/or counseling.) Pt reports, she was at [facility] months ago but she walked because they treated her like crap.   Pt presents alert in scrubs with normal speech. Pt's mood was anxious, depressed, irritable. Pt's affect was congruent. Pt's insight, judgement was poor. Pt report, if discharged she can contract for safety. Pt walked out of the assessment and said she was leaving. Clinician discussed the three possible dispositions (discharged with OPT resources, observe/reassess by psychiatry or inpatient treatment) in detail. Pt did not complete the assessment, she walked out of the assessment and said she was leaving.   Diagnosis: Alcohol use Disorder, severe.   *Pt declined for clinician to contact family, friends to obtain collateral information.*  Chief Complaint:  Chief Complaint  Patient presents with   Suicidal   Visit Diagnosis:     CCA Screening, Triage and Referral (STR)  Patient Reported Information How did you hear about [place]? Family/Friend  What Is the Reason for Your Visit/Call Today? Per EDP note: "Pt presents to the ED today with SI. The pt called her sister and told her that she wanted to die. Pt has a hx of alcohol abuse. Pt started drinking heavily about 2 years ago when she was taken off her chronic pain meds that she was getting from multiple providers. Pt's  main complaint now is that she wants an egg sandwich. Pt was here yesterday for alcohol abuse."  How Long Has This Been Causing You Problems? > than 6 months  What Do You Feel Would Help You the Most Today? Alcohol or Drug Use Treatment   Have You Recently Had Any Thoughts About Hurting Yourself? Yes (Per EDP note however pt  denies.)  Are You Planning to Commit Suicide/Harm Yourself At This time? No (Pt denies.)   Have you Recently Had Thoughts About [place]? No  Are You Planning to Harm Someone at This Time? No  Explanation: No data recorded  Have You Used Any Alcohol or Drugs in the Past 24 Hours? Yes  How Long Ago Did You Use Drugs or Alcohol? No data recorded What Did You Use and How Much? Pt reports, her alcohol intake has increased since she no longer receives Tramadol for her back pain.   Do You Currently Have a Therapist/Psychiatrist? No  Name of Therapist/Psychiatrist: No data recorded  Have You Been Recently Discharged From Any Office Practice or Programs? No  Explanation of Discharge From Practice/Program: No data recorded    CCA Screening Triage Referral Assessment Type of Contact: Tele-Assessment  Telemedicine Service Delivery: Telemedicine service delivery: This service was provided via telemedicine using a 2-way, interactive audio and video technology  Is this Initial or Reassessment? Initial Assessment  Date Telepsych consult ordered in [facility]:  05/25/21  Time Telepsych consult ordered in [facility]:  1916  Location of Assessment: [facility] Assessment Services  Provider Location: [facility] Assessment Services   Collateral Involvement: Pt declined for clinician to contact family, friends to obtain collateral information.   Does Patient Have a Stage manager Guardian? No data recorded Name and Contact of Legal Guardian: No data recorded If Minor and Not Living with Parent(s), Who has Custody? No data recorded Is CPS involved or ever been involved? Never  Is APS involved or ever been involved? Never   Patient Determined To Be At Risk for Harm To Self or Others Based on Review of Patient Reported Information or Presenting Complaint? Yes, for Self-Harm (Per EDP note.)  Method: No data recorded Availability of Means: No data recorded Intent: No data  recorded Notification Required: No data recorded Additional Information for Danger to Others Potential: No data recorded Additional Comments for Danger to Others Potential: No data recorded Are There Guns or Other Weapons in Your Home? No data recorded Types of Guns/Weapons: No data recorded Are These Weapons Safely Secured?                            No data recorded Who Could Verify You Are Able To Have These Secured: No data recorded Do You Have any Outstanding Charges, Pending Court Dates, Parole/Probation? No data recorded Contacted To Inform of Risk of Harm To Self or Others: No data recorded   Does Patient Present under Involuntary Commitment? No  IVC Papers Initial File Date: No data recorded  [place] of Residence: [place]   Patient Currently Receiving the Following Services: Not Receiving Services   Determination of Need: Urgent (48 hours)   Options For Referral: Facility-Based Crisis; Inpatient Hospitalization; Medication Management; Outpatient Therapy     CCA Biopsychosocial Patient Reported Schizophrenia/Schizoaffective Diagnosis in Past: No   Strengths: Supportive family   Mental Health Symptoms Depression:   Sleep (too much or little); Irritability; Fatigue   Duration of Depressive symptoms:    Mania:  N/A   Anxiety:    Tension; Worrying   Psychosis:   None   Duration of Psychotic symptoms:    Trauma:   None   Obsessions:   -- (UTA)   Compulsions:   -- (UTA)   Inattention:   -- (UTA)   Hyperactivity/Impulsivity:   -- (UTA)   Oppositional/Defiant Behaviors:   -- (UTA)   Emotional Irregularity:   Potentially harmful impulsivity; Recurrent suicidal behaviors/gestures/threats   Other Mood/Personality Symptoms:  No data recorded   Mental Status Exam Appearance and self-care  Stature:   Average   Weight:   Average weight   Clothing:   -- (Pt in scrubs.)   Grooming:   Normal   Cosmetic use:   None   Posture/gait:    Normal   Motor activity:   Not Remarkable   Sensorium  Attention:   Normal   Concentration:   Normal   Orientation:   X5   Recall/memory:   Normal   Affect and Mood  Affect:   Congruent   Mood:   Anxious; Depressed; Irritable   Relating  Eye contact:   Normal   Facial expression:   Depressed   Attitude toward examiner:   Cooperative   Thought and Language  Speech flow:  Normal   Thought content:   Appropriate to Mood and Circumstances   Preoccupation:   None   Hallucinations:   None   Organization:  No data recorded  [employer] of Knowledge:   Average   Intelligence:   Average   Abstraction:   Normal   Judgement:   Poor   Reality Testing:   Adequate   Insight:   Poor   Decision Making:   Impulsive   Social Functioning  Social Maturity:   Impulsive   Social Judgement:   Heedless   Stress  Stressors:   Illness; Other (Comment) (Chronic back pain. Pt also reports, during [place] she had her grandkids.)   Coping Ability:   Deficient supports   Skill Deficits:   Self-control   Supports:   Family     Religion: Religion/Spirituality Are You A Religious Person?: Yes What is Your Religious Affiliation?: Christian  Leisure/Recreation: Leisure / Recreation Do You Have Hobbies?:  (UTA)  Exercise/Diet: Exercise/Diet Do You Exercise?:  (UTA) Do You Follow a Special Diet?:  (UTA) Do You Have Any Trouble Sleeping?: Yes Explanation of Sleeping Difficulties: Pt reports, "I dont sleep that good."   CCA Employment/Education Employment/Work Situation: Employment / Work Copywriter, advertising Employment Situation: Retired (Per chart.) Patient's Job has Been Impacted by Current Illness: No  Education: Education Is Patient Currently Attending School?: No Did You Nutritional therapist?:  (UTA)   CCA Family/Childhood History Family and Relationship History: Family history Marital status: Widowed Widowed, when?: Pt reports,  her husband years ago. Does patient have children?: Yes How many children?: 5 How is patient's relationship with their children?: Pt reports, she lives next door to her daughter.  Childhood History:  Childhood History Did patient suffer any verbal/emotional/physical/sexual abuse as a child?: No Did patient suffer from severe childhood neglect?:  (UTA) Has patient ever been sexually abused/assaulted/raped as an adolescent or adult?:  (UTA) Was the patient ever a victim of a crime or a disaster?:  (UTA) Witnessed domestic violence?:  (UTA)  Child/Adolescent Assessment:     CCA Substance Use Alcohol/Drug Use: Alcohol / Drug Use Pain Medications: See MAR Prescriptions: See MAR Over the Counter: See MAR History of alcohol / drug use?: Yes  Withdrawal Symptoms: Irritability     ASAM's:  Six Dimensions of Multidimensional Assessment  Dimension 1:  Acute Intoxication and/or Withdrawal Potential:   Dimension 1:  Description of individual's past and current experiences of substance use and withdrawal: Pt denies recent withdrawal but reports if she doesn't drink she will experience withdrawal symptoms.  Dimension 2:  Biomedical Conditions and Complications:   Dimension 2:  Description of patient's biomedical conditions and  complications: Back pain  Dimension 3:  Emotional, Behavioral, or Cognitive Conditions and Complications:  Dimension 3:  Description of emotional, behavioral, or cognitive conditions and complications: Indicated history of depression.  Dimension 4:  Readiness to Change:  Dimension 4:  Description of Readiness to Change criteria: Some awareness.  Dimension 5:  Relapse, Continued use, or Continued Problem Potential:  Dimension 5:  Relapse, continued use, or continued problem potential critiera description: Possible relapse.  Dimension 6:  Recovery/Living Environment:  Dimension 6:  Recovery/Iiving environment criteria description: Pt reports, living alone.  ASAM Severity  Score: ASAM's Severity Rating Score: 12  ASAM Recommended Level of Treatment: ASAM Recommended Level of Treatment: Level II Intensive Outpatient Treatment   Substance use Disorder (SUD) Substance Use Disorder (SUD)  Checklist Symptoms of Substance Use: Evidence of withdrawal (Comment), Continued use despite having a persistent/recurrent physical/psychological problem caused/exacerbated by use, Continued use despite persistent or recurrent social, interpersonal problems, caused or exacerbated by use, Evidence of tolerance  Recommendations for Services/Supports/Treatments: Recommendations for Services/Supports/Treatments Recommendations For Services/Supports/Treatments: Other (Comment) (Pt to be observed and reassessed by psychiatry.)  Discharge Disposition:    DSM5 Diagnoses: Patient Active Problem List   Diagnosis Date Noted   AKI (acute kidney injury) ([place]) 05/19/2021   Elevated LFTs 85/46/2703   Metabolic acidosis 50/03/3817   COVID-19 virus infection 04/15/2021    Class: Acute   Hypotensive episode 04/15/2021    Class: Acute   Physical deconditioning    Vitamin B12 deficiency 02/04/2021   Leukocytosis 11/23/2020   Alcohol use disorder, severe, dependence ([place]) 10/08/2020   Hyponatremia 09/17/2020   Alcohol withdrawal ([facility]) 09/12/2020   Chronic bronchitis ([place]) 08/20/2020   Moderate protein malnutrition ([place]) 08/20/2020   Chronic low back pain 08/19/2020   Elevated SGOT ([facility]) 08/19/2020   Elevated troponin 08/19/2020   Increased serum lipase level 08/19/2020   Large bowel obstruction (HCC)    Small bowel obstruction ([place]) 07/20/2018   Hypomagnesemia 07/20/2018   Cough 03/14/2018   Polypharmacy 02/23/2017   Hypokalemia 29/93/7169   Toxic metabolic encephalopathy 67/89/3810   Anxiety 02/22/2017   GERD (gastroesophageal reflux disease) 02/22/2017   Pain medication agreement signed 10/10/2016   Opioid dependence ([place]) 10/10/2016   Status post colostomy takedown 04/12/2016    Chronic constipation 12/03/2015   Peritonitis with abscess of intestine ([place]) 11/29/2015   COPD exacerbation ([place]) 09/20/2015   Osteopenia 06/10/2014   Vitamin D deficiency 06/10/2014   Hypothyroidism    Depression 11/18/2012   Insomnia 11/18/2012   Chronic pain syndrome 11/18/2012   HLD (hyperlipidemia) 09/01/2012   S/P colostomy ([place]) 04/19/2012   Normocytic anemia 10/15/2011   Hypertension 10/13/2011   Chronic back pain 10/13/2011   COPD (chronic obstructive pulmonary disease) ([place]) 10/09/2011     Referrals to Alternative Service(s): Referred to Alternative Service(s):   Place:   Date:   Time:    Referred to Alternative Service(s):   Place:   Date:   Time:    Referred to Alternative Service(s):   Place:   Date:   Time:    Referred to  Alternative Service(s):   Place:   Date:   Time:     Vertell Novak, [facility] Comprehensive Clinical Assessment (CCA) Screening, Triage and Referral Note  05/25/2021 Stacy Dalton [id number]  Chief Complaint:  Chief Complaint  Patient presents with   Suicidal   Visit Diagnosis:   Patient Reported Information How did you hear about [place]? Family/Friend  What Is the Reason for Your Visit/Call Today? Per EDP note: "Pt presents to the ED today with SI. The pt called her sister and told her that she wanted to die. Pt has a hx of alcohol abuse. Pt started drinking heavily about 2 years ago when she was taken off her chronic pain meds that she was getting from multiple providers. Pt's main complaint now is that she wants an egg sandwich. Pt was here yesterday for alcohol abuse."  How Long Has This Been Causing You Problems? > than 6 months  What Do You Feel Would Help You the Most Today? Alcohol or Drug Use Treatment   Have You Recently Had Any Thoughts About Hurting Yourself? Yes (Per EDP note however pt denies.)  Are You Planning to Commit Suicide/Harm Yourself At This time? No (Pt denies.)   Have you Recently Had Thoughts About  [place]? No  Are You Planning to Harm Someone at This Time? No  Explanation: No data recorded  Have You Used Any Alcohol or Drugs in the Past 24 Hours? Yes  How Long Ago Did You Use Drugs or Alcohol? No data recorded What Did You Use and How Much? Pt reports, her alcohol intake has increased since she no longer receives Tramadol for her back pain.   Do You Currently Have a Therapist/Psychiatrist? No  Name of Therapist/Psychiatrist: No data recorded  Have You Been Recently Discharged From Any Office Practice or Programs? No  Explanation of Discharge From Practice/Program: No data recorded   CCA Screening Triage Referral Assessment Type of Contact: Tele-Assessment  Telemedicine Service Delivery: Telemedicine service delivery: This service was provided via telemedicine using a 2-way, interactive audio and video technology  Is this Initial or Reassessment? Initial Assessment  Date Telepsych consult ordered in [facility]:  05/25/21  Time Telepsych consult ordered in [facility]:  1916  Location of Assessment: [facility] Assessment Services  Provider Location: [facility] Assessment Services   Collateral Involvement: Pt declined for clinician to contact family, friends to obtain collateral information.   Does Patient Have a Stage manager Guardian? No data recorded Name and Contact of Legal Guardian: No data recorded If Minor and Not Living with Parent(s), Who has Custody? No data recorded Is CPS involved or ever been involved? Never  Is APS involved or ever been involved? Never   Patient Determined To Be At Risk for Harm To Self or Others Based on Review of Patient Reported Information or Presenting Complaint? Yes, for Self-Harm (Per EDP note.)  Method: No data recorded Availability of Means: No data recorded Intent: No data recorded Notification Required: No data recorded Additional Information for Danger to Others Potential: No data recorded Additional Comments for  Danger to Others Potential: No data recorded Are There Guns or Other Weapons in Your Home? No data recorded Types of Guns/Weapons: No data recorded Are These Weapons Safely Secured?                            No data recorded Who Could Verify You Are Able To Have These Secured: No  data recorded Do You Have any Outstanding Charges, Pending Court Dates, Parole/Probation? No data recorded Contacted To Inform of Risk of Harm To Self or Others: No data recorded  Does Patient Present under Involuntary Commitment? No  IVC Papers Initial File Date: No data recorded  [place] of Residence: [place]   Patient Currently Receiving the Following Services: Not Receiving Services   Determination of Need: Urgent (48 hours)   Options For Referral: Facility-Based Crisis; Inpatient Hospitalization; Medication Management; Outpatient Therapy   Discharge Disposition:     Vertell Novak, [place], [place], [facility], [facility] Triage Specialist [phone]

## 2021-05-25 NOTE — ED Notes (Signed)
Pt son states pt does not need to go home. Says she has been to hospital 3X today ([facility], [facility]) because of alcohol. Says she has said she wanted to die for the last few days. Says they send her home, and she drinks and becomes suicidal each time. Says pt has been depressed for the last year, has not kept up with home and self. States after her husband passed away about 8 years ago she has gone down hill since then.   Son says drs have told him pts body does not process alcohol. Says that it turns into ammonia.   Also says pt has called him over a dozen times today stating that she wants to die. Says he has called police, magistrate, no one can keep her sober. Says she has been "drunk for the last 30 days".

## 2021-05-26 DIAGNOSIS — F1020 Alcohol dependence, uncomplicated: Secondary | ICD-10-CM | POA: Diagnosis not present

## 2021-05-26 LAB — RAPID URINE DRUG SCREEN, HOSP PERFORMED
Amphetamines: NOT DETECTED
Barbiturates: NOT DETECTED
Benzodiazepines: POSITIVE — AB
Cocaine: NOT DETECTED
Opiates: NOT DETECTED
Tetrahydrocannabinol: NOT DETECTED

## 2021-05-26 LAB — URINALYSIS, ROUTINE W REFLEX MICROSCOPIC
Bilirubin Urine: NEGATIVE
Glucose, UA: NEGATIVE mg/dL
Hgb urine dipstick: NEGATIVE
Ketones, ur: NEGATIVE mg/dL
Nitrite: NEGATIVE
Protein, ur: NEGATIVE mg/dL
Specific Gravity, Urine: 1.015 (ref 1.005–1.030)
pH: 7.5 (ref 5.0–8.0)

## 2021-05-26 LAB — URINALYSIS, MICROSCOPIC (REFLEX)

## 2021-05-26 MED ORDER — AMLODIPINE BESYLATE 5 MG PO TABS
5.0000 mg | ORAL_TABLET | Freq: Every day | ORAL | Status: DC
  Administered 2021-05-26: 5 mg via ORAL
  Filled 2021-05-26: qty 1

## 2021-05-26 MED ORDER — LORAZEPAM 1 MG PO TABS
2.0000 mg | ORAL_TABLET | Freq: Once | ORAL | Status: AC
  Administered 2021-05-26: 2 mg via ORAL
  Filled 2021-05-26: qty 2

## 2021-05-26 MED ORDER — LORAZEPAM 1 MG PO TABS: 1.0000 mg | ORAL_TABLET | Freq: Three times a day (TID) | ORAL | 0 refills | Status: DC | PRN

## 2021-05-26 MED ORDER — MAGNESIUM OXIDE 400 MG PO TABS
400.0000 mg | ORAL_TABLET | Freq: Every day | ORAL | Status: DC
  Administered 2021-05-26: 400 mg via ORAL
  Filled 2021-05-26 (×3): qty 1

## 2021-05-26 MED ORDER — LEVOTHYROXINE SODIUM 50 MCG PO TABS: 50.0000 ug | ORAL_TABLET | Freq: Every day | ORAL | Status: DC

## 2021-05-26 MED ORDER — LORAZEPAM 1 MG PO TABS
3.0000 mg | ORAL_TABLET | Freq: Once | ORAL | Status: AC
  Administered 2021-05-26: 3 mg via ORAL
  Filled 2021-05-26: qty 3

## 2021-05-26 NOTE — Discharge Instructions (Signed)
Avoid drinking all forms of alcohol.  We sent a prescription for Ativan to your pharmacy to use instead of drinking.  Return here if needed.  Follow-up for treatment as suggested on this paperwork.

## 2021-05-26 NOTE — ED Notes (Signed)
Pt is pych cleared

## 2021-05-26 NOTE — ED Provider Notes (Signed)
Emergency Medicine Observation Re-evaluation Note  Stacy Dalton is a 70 y.o. female, seen on rounds today.  Pt initially presented to the ED for complaints of Suicidal Currently, the patient is resting comfortably.  She states that she plans on stopping drinking as is her boyfriend who is a beer drinker, and lives with her.  She states she would like to have some medicine at home to help avoid withdrawal symptoms.  She denies ever having DTs.  She does not like Librium because it caused some type of reaction.  Physical Exam  BP (!) 185/97 (BP Location: Right Arm)   Pulse (!) 105   Temp 98.7 F (37.1 C)   Resp (!) 21   SpO2 98%  Physical Exam General: Alert and comfortable Cardiac: Mild tachycardia Lungs: Normal respiratory rate Psych: Not responding to internal stimuli  ED Course / MDM  EKG:   I have reviewed the labs performed to date as well as medications administered while in observation.  Recent changes in the last 24 hours include she has been cleared by psychiatry.  She desires to go home.  Will discharge with Ativan prescription and some to go medication.  Plan  Current plan is for discharge home with prescription for Ativan for alcohol withdrawal symptoms.  SRESHTA CRESSLER is not under involuntary commitment.     Daleen Bo, MD 05/28/21 [phone]

## 2021-05-26 NOTE — ED Notes (Signed)
TTS at this time. 

## 2021-05-26 NOTE — ED Notes (Signed)
Pt given belonging and portable phone to call ride.

## 2021-05-26 NOTE — Consult Note (Addendum)
Telepsych Consultation   Reason for Consult:  psychiatric evaluaiton Referring Physician:  Dr. Eulis Foster, EDP Location of Patient:  [facility] Location of Provider: [facility]  Patient Identification: Stacy Dalton MRN:  [id number] Principal Diagnosis: Alcohol use disorder, severe, dependence ([place]) Diagnosis:  Principal Problem:   Alcohol use disorder, severe, dependence ([place])   Total Time spent with patient: 30 minutes  Subjective:   Stacy Dalton is a 70 y.o. female patient admitted with  per admit note:   Pt presents to the ED today with SI.  The pt called her sister and told her that she wanted to die.  Pt has a hx of alcohol abuse.  Pt started drinking heavily about 2 years ago when she was taken off her chronic pain meds that she was getting from multiple providers.  Pt's main complaint now is that she wants an egg sandwich.     Pt was here yesterday for alcohol abuse. Patient has been seen for this problem > 10 times in 2022. UDS + benzos. , Alcohol level on 11/23 at 1721:  328. Alcohol level on 11/22: 416.0.   .  HPI:  Stacy Dalton is 70 year old female seen via tele psych at [facility]. Case reviewed with Dr. Lovette Cliche, chart reviewed on 05/26/21. History of severe alcohol use disorder with recent medical admits on 04-14-09-18, 05-18-10-19, ED visits on 10-7, 11-22 at [facility] and 11-22 with [facility] for intoxication.   Alert and oriented x 3, cooperative with interview with good eye contact. Patient is sitting on Ed stretcher eating. When asked why she is in the emergency department, she reports, "My sister thought I needed to be checked out, she hasn't seen me in a while". When asked why her sister thought she needed to be checked out, "because I drink too much".    Patient reports, "I am tired of drinking, I don't even want it". She reports it all started when she was not prescribed Tramadol because her provider thought she was taking more than she should have and she started  drinking at that time, which was about a year ago. "My tramadol kept walking out the door" Patient believes someone was stealing her pills and selling them on the streets. She took Tramadol for chronic back pain.   She describes herself as depressed when she thinks of her own mortality, but she is not suicidal. Denies suicidal ideation, no intent, no plan, has never been suicidal, never attempted suicide. Denies homicidal ideation, denies auditory and visual hallucinations. Patient believes she would go "straight to hell" if she killed herself. "I don't want that". Contracts for safety. Wants to get home to see her dog who is locked inside her home.    Has visible tremors, but says "I am fine". "If I get home and start to feel bad I will call 9-1-1".   Endorses heavy drinking daily, drinks Vodka, last drank yesterday early in the day. Appetite varies from not hungry to eating all the time. Weight has been stable. Sleep is poor, states she has always had a hard time sleeping. Lives with boyfriend and her dog, Deneise Lever, who she describes as "my baby". Patient reports "I thought I was using alcohol as medicine for the pain".   History of depression, PCP prescribes Cymbalta. Compliant with medication for depression.   When asked how can we help today?  "I want a counselor, someone to talk to". When asked about long-term residential treatment for alcohol use disorder, "I can't go  to long term treatment, because of Deneise Lever" (Her dog).   Court date pending for DUI she received in August. Someone called highway patrol when she was at the convenient store and she was arrested. No court date set. Has attorney.   Past Psychiatric History: depression  Risk to Self:  no Risk to Others:  no Prior Inpatient Therapy:  no Prior Outpatient Therapy:  no  Past Medical History:  Past Medical History:  Diagnosis Date   Chronic pain    COPD (chronic obstructive pulmonary disease) ([place])    told has copd, no current  inhaler use   Cough    Depression    ETOH abuse    GERD (gastroesophageal reflux disease)    Hypertension    Hypothyroidism    Insomnia    Migraine    Migraine    Osteopenia    Pain management    Panic attacks    Small bowel obstruction ([place])     Past Surgical History:  Procedure Laterality Date   ABDOMINAL HYSTERECTOMY     COLON SURGERY     COLOSTOMY CLOSURE  04/19/2012   Procedure: COLOSTOMY CLOSURE;  Surgeon: Adin Hector, MD;  Location: [facility];  Service: General;  Laterality: N/A;  Laparotomy, Resection and Closure of Colostomy   COLOSTOMY TAKEDOWN N/A 04/12/2016   Procedure: [place] TAKEDOWN;  Surgeon: Johnathan Hausen, MD;  Location: [facility];  Service: General;  Laterality: N/A;   INCONTINENCE SURGERY     LAPAROTOMY  10/04/2011, colostomy also   Procedure: EXPLORATORY LAPAROTOMY;  Surgeon: Adin Hector, MD;  Location: [facility];  Service: General;  Laterality: N/A;  left partial colectomy with colostomy   LAPAROTOMY  04/19/2012   Procedure: EXPLORATORY LAPAROTOMY;  Surgeon: Adin Hector, MD;  Location: [facility];  Service: General;  Laterality: N/A;   LAPAROTOMY N/A 11/29/2015   Procedure: EXPLORATORY LAPAROTOMY; SUBTOTAL COLECTOMY WITH HARTMAN PROCEDURE AND END COLOSTOMY;  Surgeon: Johnathan Hausen, MD;  Location: [facility];  Service: General;  Laterality: N/A;   TUBAL LIGATION     VENTRAL HERNIA REPAIR  04/19/2012   Procedure: HERNIA REPAIR VENTRAL ADULT;  Surgeon: Adin Hector, MD;  Location: [facility];  Service: General;  Laterality: N/A;   Family History:  Family History  Problem Relation Age of Onset   Heart disease Mother    Hypertension Mother    Cancer Sister        sinus   Diabetes Brother    Heart disease Brother    Thyroid disease Brother    Cancer Sister        abdominal ?   Thyroid disease Sister    Cancer Other        GE junction adenocarcinoma   Emphysema Father    Stroke Father    Hypertension Father    Heart disease Father    COPD Sister     Thyroid disease Sister    Thyroid disease Sister    Diabetes Brother    Thyroid disease Brother    Thyroid disease Brother    Hypertension Brother    Post-traumatic stress disorder Brother    COPD Brother    Thyroid disease Brother    Thyroid disease Brother    Hypertension Brother    Transient ischemic attack Brother    Family Psychiatric  History: unknown Social History:  Social History   Substance and Sexual Activity  Alcohol Use Yes   Comment: 1bottle vodka -3 days. As of 03/01/21 pt  states she  has quit drinking"for several days."     Social History   Substance and Sexual Activity  Drug Use No    Social History   Socioeconomic History   Marital status: Widowed    Spouse name: Not on file   Number of children: 5   Years of education: 8   Highest education level: 8th grade  Occupational History   Occupation: Retired  Tobacco Use   Smoking status: Former    Packs/day: 1.50    Years: 20.00    Pack years: 30.00    Types: Cigarettes    Quit date: 10/04/1998    Years since quitting: 22.6   Smokeless tobacco: Never  Vaping Use   Vaping Use: Never used  Substance and Sexual Activity   Alcohol use: Yes    Comment: 1bottle vodka -3 days. As of 03/01/21 pt  states she has quit drinking"for several days."   Drug use: No   Sexual activity: Yes  Other Topics Concern   Not on file  Social History Narrative   Pt lives alone. States 2 children live near by.   Social Determinants of Health   Financial Resource Strain: Low Risk    Difficulty of Paying Living Expenses: Not very hard  Food Insecurity: No Food Insecurity   Worried About Charity fundraiser in the Last Year: Never true   Ran Out of Food in the Last Year: Never true  Transportation Needs: No Transportation Needs   Lack of Transportation (Medical): No   Lack of Transportation (Non-Medical): No  Physical Activity: Inactive   Days of Exercise per Week: 0 days   Minutes of Exercise per Session: 0 min   Stress: Stress Concern Present   Feeling of Stress : To some extent  Social Connections: Socially Isolated   Frequency of Communication with Friends and Family: Three times a week   Frequency of Social Gatherings with Friends and Family: Three times a week   Attends Religious Services: Never   Active Member of Clubs or Organizations: No   Attends Archivist Meetings: Never   Marital Status: Widowed   Additional Social History:    Allergies:   Allergies  Allergen Reactions   Librium [Chlordiazepoxide]     Became unresponsive   Toradol [Ketorolac Tromethamine] Shortness Of Breath   Butalbital-Apap-Caffeine Other (See Comments)    jittery   Demerol Swelling   Esgic [Butalbital-Apap-Caffeine] Other (See Comments)    jittery   Iodine Other (See Comments)    bp bottomed out per pt several hours later; unsure if pre medicated in past with cm; done in [place]    Labs:  Results for orders placed or performed during the hospital encounter of 05/25/21 (from the past 48 hour(s))  Comprehensive metabolic panel     Status: Abnormal   Collection Time: 05/25/21  5:21 PM  Result Value Ref Range   Sodium 140 135 - 145 mmol/L   Potassium 3.5 3.5 - 5.1 mmol/L   Chloride 106 98 - 111 mmol/L   CO2 26 22 - 32 mmol/L   Glucose, Bld 96 70 - 99 mg/dL    Comment: Glucose reference range applies only to samples taken after fasting for at least 8 hours.   BUN 19 8 - 23 mg/dL   Creatinine, Ser 0.73 0.44 - 1.00 mg/dL   Calcium 9.2 8.9 - 10.3 mg/dL   Total Protein 6.2 (L) 6.5 - 8.1 g/dL   Albumin 3.7 3.5 - 5.0 g/dL   AST  51 (H) 15 - 41 U/L   ALT 38 0 - 44 U/L   Alkaline Phosphatase 77 38 - 126 U/L   Total Bilirubin 0.4 0.3 - 1.2 mg/dL   GFR, Estimated >60 >60 mL/min    Comment: (NOTE) Calculated using the CKD-EPI Creatinine Equation (2021)    Anion gap 8 5 - 15    Comment: Performed at [facility], [address], [place], [place] [address]  Ethanol     Status: Abnormal   Collection  Time: 05/25/21  5:21 PM  Result Value Ref Range   Alcohol, Ethyl (B) 328 (HH) <10 mg/dL    Comment: CRITICAL RESULT CALLED TO, READ BACK BY AND VERIFIED WITH: ROBERTS,M ON 05/25/21 AT 1835 BY LOY,C (NOTE) Lowest detectable limit for serum alcohol is 10 mg/dL.  For medical purposes only. Performed at [facility], [address], [place], [place] [address]   CBC with Diff     Status: Abnormal   Collection Time: 05/25/21  5:21 PM  Result Value Ref Range   WBC 6.5 4.0 - 10.5 K/uL   RBC 3.52 (L) 3.87 - 5.11 MIL/uL   Hemoglobin 10.1 (L) 12.0 - 15.0 g/dL   HCT 30.8 (L) 36.0 - 46.0 %   MCV 87.5 80.0 - 100.0 fL   MCH 28.7 26.0 - 34.0 pg   MCHC 32.8 30.0 - 36.0 g/dL   RDW 17.4 (H) 11.5 - 15.5 %   Platelets 273 150 - 400 K/uL   nRBC 0.0 0.0 - 0.2 %   Neutrophils Relative % 55 %   Neutro Abs 3.6 1.7 - 7.7 K/uL   Lymphocytes Relative 30 %   Lymphs Abs 1.9 0.7 - 4.0 K/uL   Monocytes Relative 11 %   Monocytes Absolute 0.7 0.1 - 1.0 K/uL   Eosinophils Relative 2 %   Eosinophils Absolute 0.2 0.0 - 0.5 K/uL   Basophils Relative 1 %   Basophils Absolute 0.0 0.0 - 0.1 K/uL   Immature Granulocytes 1 %   Abs Immature Granulocytes 0.06 0.00 - 0.07 K/uL    Comment: Performed at [facility], [address], [place], [place] [address]  Salicylate level     Status: Abnormal   Collection Time: 05/25/21  5:21 PM  Result Value Ref Range   Salicylate Lvl <6.7 (L) 7.0 - 30.0 mg/dL    Comment: Performed at [facility], [address] [place], [place] [address]  Acetaminophen level     Status: Abnormal   Collection Time: 05/25/21  5:21 PM  Result Value Ref Range   Acetaminophen (Tylenol), Serum <10 (L) 10 - 30 ug/mL    Comment: (NOTE) Therapeutic concentrations vary significantly. A range of 10-30 ug/mL  may be an effective concentration for many patients. However, some  are best treated at concentrations outside of this range. Acetaminophen concentrations >150 ug/mL at 4 hours after ingestion  and  >50 ug/mL at 12 hours after ingestion are often associated with  toxic reactions.  Performed at [facility], [address] [place], [place] [address]   Magnesium     Status: None   Collection Time: 05/25/21  5:21 PM  Result Value Ref Range   Magnesium 1.7 1.7 - 2.4 mg/dL    Comment: Performed at [facility], [address], [place], [place] [address]  Urine rapid drug screen (hosp performed)     Status: Abnormal   Collection Time: 05/26/21 10:00 AM  Result Value Ref Range   Opiates NONE DETECTED NONE DETECTED  Cocaine NONE DETECTED NONE DETECTED   Benzodiazepines POSITIVE (A) NONE DETECTED   Amphetamines NONE DETECTED NONE DETECTED   Tetrahydrocannabinol NONE DETECTED NONE DETECTED   Barbiturates NONE DETECTED NONE DETECTED    Comment: (NOTE) DRUG SCREEN FOR MEDICAL PURPOSES ONLY.  IF CONFIRMATION IS NEEDED FOR ANY PURPOSE, NOTIFY LAB WITHIN 5 DAYS.  LOWEST DETECTABLE LIMITS FOR URINE DRUG SCREEN Drug Class                     Cutoff (ng/mL) Amphetamine and metabolites    1000 Barbiturate and metabolites    200 Benzodiazepine                 295 Tricyclics and metabolites     300 Opiates and metabolites        300 Cocaine and metabolites        300 THC                            50 Performed at [facility], [address] [place], [place] [address]   Urinalysis, Routine w reflex microscopic     Status: Abnormal   Collection Time: 05/26/21 10:00 AM  Result Value Ref Range   Color, Urine YELLOW YELLOW   APPearance HAZY (A) CLEAR   Specific Gravity, Urine 1.015 1.005 - 1.030   pH 7.5 5.0 - 8.0   Glucose, UA NEGATIVE NEGATIVE mg/dL   Hgb urine dipstick NEGATIVE NEGATIVE   Bilirubin Urine NEGATIVE NEGATIVE   Ketones, ur NEGATIVE NEGATIVE mg/dL   Protein, ur NEGATIVE NEGATIVE mg/dL   Nitrite NEGATIVE NEGATIVE   Leukocytes,Ua TRACE (A) NEGATIVE    Comment: Performed at [facility], [address], [place], [place] [address]  Urinalysis, Microscopic (reflex)      Status: Abnormal   Collection Time: 05/26/21 10:00 AM  Result Value Ref Range   RBC / HPF 0-5 0 - 5 RBC/hpf   WBC, UA 6-10 0 - 5 WBC/hpf   Bacteria, UA MANY (A) NONE SEEN   Squamous Epithelial / LPF 11-20 0 - 5   Non Squamous Epithelial PRESENT (A) NONE SEEN    Comment: Performed at [facility], [address], [place], [place] [address]    Medications:  Current Facility-Administered Medications  Medication Dose Route Frequency Provider Last Rate Last Admin   acetaminophen (TYLENOL) tablet 650 mg  650 mg Oral Q4H PRN Isla Pence, MD       alum & mag hydroxide-simeth (MAALOX/MYLANTA) 200-200-20 MG/5ML suspension 30 mL  30 mL Oral Q6H PRN Isla Pence, MD       amLODipine (NORVASC) tablet 5 mg  5 mg Oral Daily Daleen Bo, MD   5 mg at 05/26/21 1355   [START ON 05/27/2021] levothyroxine (SYNTHROID) tablet 50 mcg  50 mcg Oral QAC breakfast Daleen Bo, MD       LORazepam (ATIVAN) injection 0-4 mg  0-4 mg Intravenous Q6H Isla Pence, MD       Or   LORazepam (ATIVAN) tablet 0-4 mg  0-4 mg Oral Q6H Isla Pence, MD   1 mg at 05/26/21 1348   [START ON 05/28/2021] LORazepam (ATIVAN) injection 0-4 mg  0-4 mg Intravenous Q12H Isla Pence, MD       Or   Derrill Memo ON 05/28/2021] LORazepam (ATIVAN) tablet 0-4 mg  0-4 mg Oral Q12H Isla Pence, MD       LORazepam (ATIVAN) tablet 3 mg  3 mg Oral Once [place],  Vira Agar, MD       magnesium oxide (MAG-OX) tablet 400 mg  400 mg Oral Daily Daleen Bo, MD   400 mg at 05/26/21 1355   ondansetron (ZOFRAN) tablet 4 mg  4 mg Oral Q8H PRN Isla Pence, MD   4 mg at 05/26/21 0804   thiamine tablet 100 mg  100 mg Oral Daily Isla Pence, MD   100 mg at 05/26/21 1100   Or   thiamine (B-1) injection 100 mg  100 mg Intravenous Daily Isla Pence, MD       traMADol Veatrice Bourbon) tablet 50 mg  50 mg Oral Q6H PRN Isla Pence, MD   50 mg at 05/26/21 1400   Current Outpatient Medications  Medication Sig Dispense Refill   amLODipine  (NORVASC) 5 MG tablet Take 1 tablet (5 mg total) by mouth daily. 30 tablet 2   levothyroxine (SYNTHROID) 50 MCG tablet Take 1 tablet (50 mcg total) by mouth daily before breakfast. 90 tablet 2   famotidine (PEPCID) 20 MG tablet Take 20 mg by mouth 2 (two) times daily. (Patient not taking: Reported on 05/25/2021)     ferrous sulfate 325 (65 FE) MG tablet Take 1 tablet (325 mg total) by mouth daily with breakfast. (Patient not taking: Reported on 05/25/2021) 30 tablet 0   fluticasone-salmeterol (ADVAIR DISKUS) 250-50 MCG/ACT AEPB Inhale 1 puff into the lungs in the morning and at bedtime. (Patient not taking: Reported on 83/15/1761) 60 each 2   folic acid (FOLVITE) 1 MG tablet Take 1 tablet (1 mg total) by mouth daily. (Patient not taking: Reported on 05/25/2021) 30 tablet 1   Magnesium Oxide 400 MG CAPS Take 1 capsule (400 mg total) by mouth daily. 30 capsule 0   metoprolol tartrate (LOPRESSOR) 25 MG tablet Take 1 tablet (25 mg total) by mouth 2 (two) times daily. (Patient not taking: Reported on 05/25/2021) 60 tablet 0   Multiple Vitamin (MULTIVITAMIN WITH MINERALS) TABS tablet Take 1 tablet by mouth daily. (Patient not taking: Reported on 05/25/2021) 30 tablet 0   ondansetron (ZOFRAN) 4 MG tablet Take 1 tablet (4 mg total) by mouth every 8 (eight) hours as needed for nausea or vomiting. (Patient not taking: Reported on 05/25/2021) 90 tablet 0   thiamine 100 MG tablet Take 1 tablet (100 mg total) by mouth daily. (Patient not taking: Reported on 05/25/2021) 30 tablet 0   traMADol (ULTRAM) 50 MG tablet Take 50 mg by mouth every 6 (six) hours as needed for pain. for pain (Patient not taking: Reported on 05/20/2021)     vitamin B-12 (CYANOCOBALAMIN) 1000 MCG tablet Take 1 tablet (1,000 mcg total) by mouth daily. (Patient not taking: Reported on 05/25/2021) 30 tablet 0   Vitamin D, Ergocalciferol, (DRISDOL) 1.25 MG (50000 UNIT) CAPS capsule Take 1 capsule (50,000 Units total) by mouth every 7 (seven) days.  (Patient not taking: Reported on 05/25/2021) 12 capsule 3    Musculoskeletal: Strength & Muscle Tone: within normal limits Gait & Station: normal Patient leans: N/A      Psychiatric Specialty Exam:  Presentation  General Appearance: Appropriate for Environment  Eye Contact:Good  Speech:Clear and Coherent; Normal Rate  Speech Volume:Normal  Handedness:No data recorded  Mood and Affect  Mood:Euthymic  Affect:Appropriate; Congruent   Thought Process  Thought Processes:Coherent; Goal Directed  Descriptions of Associations:Intact  Orientation:Full (Time, Place and Person)  Thought Content:Logical  History of Schizophrenia/Schizoaffective disorder:No  Duration of Psychotic Symptoms:No data recorded Hallucinations:Hallucinations: None Ideas of Reference:None  Suicidal Thoughts:Suicidal Thoughts: No  Homicidal Thoughts:Homicidal Thoughts: No  Sensorium  Memory:Immediate Good; Recent Good; Remote Good  Judgment:Fair  Insight:Fair   Executive Functions  Concentration:Good  Attention Span:Good  Recall:Good  Fund of Knowledge:Good  Language:Good   Psychomotor Activity  Psychomotor Activity:Psychomotor Activity: Normal  Assets  Assets:Communication Skills; Desire for Improvement; Financial Resources/Insurance; Housing; Leisure Time; Intimacy; Physical Health; Social Support; Transport planner; Vocational/Educational   Sleep  Sleep:Sleep: Poor   Physical Exam: Physical Exam Vitals and nursing note reviewed.  Constitutional:      General: She is not in acute distress. Cardiovascular:     Rate and Rhythm: Normal rate.  Pulmonary:     Effort: Pulmonary effort is normal.  Neurological:     Mental Status: She is alert and oriented to person, place, and time.  Psychiatric:        Attention and Perception: Attention normal.        Mood and Affect: Mood normal.        Speech: Speech normal.        Behavior: Behavior is cooperative.        Thought  Content: Thought content is not paranoid or delusional. Thought content does not include homicidal or suicidal ideation. Thought content does not include homicidal or suicidal plan.   Review of Systems  Constitutional:  Negative for chills and fever.  Respiratory:  Negative for shortness of breath.   Cardiovascular:  Negative for chest pain.  Gastrointestinal:  Negative for abdominal pain.  Neurological:  Positive for tremors (alcohol withdrawl). Negative for headaches.  Psychiatric/Behavioral:  Positive for substance abuse. Negative for depression, hallucinations and suicidal ideas. The patient has insomnia. The patient is not nervous/anxious.   Blood pressure (!) 185/97, pulse (!) 105, temperature 98.7 F (37.1 C), resp. rate (!) 21, SpO2 98 %. There is no height or weight on file to calculate BMI.  Treatment Plan Summary: Plan This patient understands that excessive alcohol consumption is harmful to her health, and feels ready to make a change. She is not interested in long-term treatment due to her dog. She is not SI/HI/AVH and wishes to start counseling to help with her problem. This patient is psychically cleared with resources added to AVS. Safety planning reviewed. This patient is at risk for serious alcohol withdrawal, as she has been medically admitted in the past for the same.   EDP to manage patients alcohol withdrawal, she is safe for discharge once medically cleared.   Disposition: Patient does not meet criteria for psychiatric inpatient admission. Supportive therapy provided about ongoing stressors. Discussed crisis plan, support from social network, calling 911, coming to the Emergency Department, and calling Suicide Hotline.  EDP, Ed staff notified of disposition once medically cleared.   This service was provided via telemedicine using a 2-way, interactive audio and video technology.  Names of all persons participating in this telemedicine service and their role in this  encounter. Name: Stacy Dalton Role: patient  Name: Pecolia Ades Role: NP  Name: Dr. Lovette Cliche Role: MD/psychiatrist     Chalmers Guest, NP 05/26/2021 2:54 PM

## 2021-05-26 NOTE — ED Notes (Signed)
TTS cart in room as requested

## 2021-05-26 NOTE — ED Notes (Signed)
Pt using phone  

## 2021-05-26 NOTE — ED Notes (Signed)
Pt taken to the bathroom and cleaned up from urinating on herself.

## 2021-05-27 LAB — VITAMIN A: Vitamin A (Retinoic Acid): 60.8 ug/dL (ref 22.0–69.5)

## 2021-06-01 ENCOUNTER — Ambulatory Visit (INDEPENDENT_AMBULATORY_CARE_PROVIDER_SITE_OTHER): Payer: Medicare Other | Admitting: Licensed Clinical Social Worker

## 2021-06-01 DIAGNOSIS — F419 Anxiety disorder, unspecified: Secondary | ICD-10-CM

## 2021-06-01 DIAGNOSIS — I10 Essential (primary) hypertension: Secondary | ICD-10-CM

## 2021-06-01 DIAGNOSIS — E785 Hyperlipidemia, unspecified: Secondary | ICD-10-CM

## 2021-06-01 DIAGNOSIS — K219 Gastro-esophageal reflux disease without esophagitis: Secondary | ICD-10-CM

## 2021-06-01 DIAGNOSIS — G4700 Insomnia, unspecified: Secondary | ICD-10-CM

## 2021-06-01 DIAGNOSIS — E039 Hypothyroidism, unspecified: Secondary | ICD-10-CM

## 2021-06-01 DIAGNOSIS — J449 Chronic obstructive pulmonary disease, unspecified: Secondary | ICD-10-CM

## 2021-06-01 DIAGNOSIS — F321 Major depressive disorder, single episode, moderate: Secondary | ICD-10-CM

## 2021-06-01 NOTE — Chronic Care Management (AMB) (Signed)
Chronic Care Management    Clinical Social Work Note  06/01/2021 Name: Stacy Dalton MRN: [id number] DOB: 08-15-1950  Stacy Dalton is a 70 y.o. year old female who is a primary care patient of Stacy Balloon, FNP. The CCM team was consulted to assist the patient with chronic disease management and/or care coordination needs related to: [name] .   Engaged with patient by telephone for follow up visit in response to provider referral for social work chronic care management and care coordination services.   Consent to Services:  The patient was given information about Chronic Care Management services, agreed to services, and gave verbal consent prior to initiation of services.  Please see initial visit note for detailed documentation.   Patient agreed to services and consent obtained.   Assessment: Review of patient past medical history, allergies, medications, and health status, including review of relevant consultants reports was performed today as part of a comprehensive evaluation and provision of chronic care management and care coordination services.     SDOH (Social Determinants of Health) assessments and interventions performed:  SDOH Interventions    Flowsheet Row Most Recent Value  SDOH Interventions   Physical Activity Interventions Other (Comments)  [walking challenges. uses a walker to help her walk]  Stress Interventions Provide Counseling  [client has stress related to managing medical needs. client has stress related to managing alcohol consumption]  Depression Interventions/Treatment  Currently on Treatment        Advanced Directives Status: See [employer]yn[employer] application for related entries.  CCM Care Plan  Allergies  Allergen Reactions   Librium [Chlordiazepoxide]     Became unresponsive   Toradol [Ketorolac Tromethamine] Shortness Of Breath   Butalbital-Apap-Caffeine Other (See Comments)    jittery   Demerol Swelling   Esgic  [Butalbital-Apap-Caffeine] Other (See Comments)    jittery   Iodine Other (See Comments)    bp bottomed out per pt several hours later; unsure if pre medicated in past with cm; done in [place]    Outpatient Encounter Medications as of 06/01/2021  Medication Sig Note   amLODipine (NORVASC) 5 MG tablet Take 1 tablet (5 mg total) by mouth daily.    famotidine (PEPCID) 20 MG tablet Take 20 mg by mouth 2 (two) times daily. (Patient not taking: Reported on 05/25/2021)    ferrous sulfate 325 (65 FE) MG tablet Take 1 tablet (325 mg total) by mouth daily with breakfast. (Patient not taking: Reported on 05/25/2021)    fluticasone-salmeterol (ADVAIR DISKUS) 250-50 MCG/ACT AEPB Inhale 1 puff into the lungs in the morning and at bedtime. (Patient not taking: Reported on 40/98/1191)    folic acid (FOLVITE) 1 MG tablet Take 1 tablet (1 mg total) by mouth daily. (Patient not taking: Reported on 05/25/2021)    levothyroxine (SYNTHROID) 50 MCG tablet Take 1 tablet (50 mcg total) by mouth daily before breakfast.    LORazepam (ATIVAN) 1 MG tablet Take 1 tablet (1 mg total) by mouth 3 (three) times daily as needed (Alcohol withdrawal symptoms).    Magnesium Oxide 400 MG CAPS Take 1 capsule (400 mg total) by mouth daily.    metoprolol tartrate (LOPRESSOR) 25 MG tablet Take 1 tablet (25 mg total) by mouth 2 (two) times daily. (Patient not taking: Reported on 05/25/2021)    Multiple Vitamin (MULTIVITAMIN WITH MINERALS) TABS tablet Take 1 tablet by mouth daily. (Patient not taking: Reported on 05/25/2021)    ondansetron (ZOFRAN) 4 MG tablet Take 1 tablet (4 mg total)  by mouth every 8 (eight) hours as needed for nausea or vomiting. (Patient not taking: Reported on 05/25/2021)    thiamine 100 MG tablet Take 1 tablet (100 mg total) by mouth daily. (Patient not taking: Reported on 05/25/2021)    traMADol (ULTRAM) 50 MG tablet Take 50 mg by mouth every 6 (six) hours as needed for pain. for pain (Patient not taking: Reported on  05/20/2021)    vitamin B-12 (CYANOCOBALAMIN) 1000 MCG tablet Take 1 tablet (1,000 mcg total) by mouth daily. (Patient not taking: Reported on 05/25/2021)    Vitamin D, Ergocalciferol, (DRISDOL) 1.25 MG (50000 UNIT) CAPS capsule Take 1 capsule (50,000 Units total) by mouth every 7 (seven) days. (Patient not taking: Reported on 05/25/2021) 04/14/2021: SUNDAY   No facility-administered encounter medications on file as of 06/01/2021.    Patient Active Problem List   Diagnosis Date Noted   AKI (acute kidney injury) ([place]) 05/19/2021   Elevated LFTs 82/42/3536   Metabolic acidosis 14/43/1540   COVID-19 virus infection 04/15/2021    Class: Acute   Hypotensive episode 04/15/2021    Class: Acute   Physical deconditioning    Vitamin B12 deficiency 02/04/2021   Leukocytosis 11/23/2020   Alcohol use disorder, severe, dependence ([place]) 10/08/2020   Hyponatremia 09/17/2020   Alcohol withdrawal ([place]) 09/12/2020   Chronic bronchitis ([place]) 08/20/2020   Moderate protein malnutrition ([place]) 08/20/2020   Chronic low back pain 08/19/2020   Elevated SGOT ([facility]) 08/19/2020   Elevated troponin 08/19/2020   Increased serum lipase level 08/19/2020   Large bowel obstruction ([facility])    Small bowel obstruction ([place]) 07/20/2018   Hypomagnesemia 07/20/2018   Cough 03/14/2018   Polypharmacy 02/23/2017   Hypokalemia 08/67/6195   Toxic metabolic encephalopathy 09/32/6712   Anxiety 02/22/2017   GERD (gastroesophageal reflux disease) 02/22/2017   Pain medication agreement signed 10/10/2016   Opioid dependence ([place]) 10/10/2016   Status post colostomy takedown 04/12/2016   Chronic constipation 12/03/2015   Peritonitis with abscess of intestine ([place]) 11/29/2015   COPD exacerbation ([place]) 09/20/2015   Osteopenia 06/10/2014   Vitamin D deficiency 06/10/2014   Hypothyroidism    Depression 11/18/2012   Insomnia 11/18/2012   Chronic pain syndrome 11/18/2012   HLD (hyperlipidemia) 09/01/2012   S/P colostomy ([place])  04/19/2012   Normocytic anemia 10/15/2011   Hypertension 10/13/2011   Chronic back pain 10/13/2011   COPD (chronic obstructive pulmonary disease) ([place]) 10/09/2011    Conditions to be addressed/monitored: monitor client management of anxiety and stress issues  Care Plan : LCSW Care Plan  Updates made by Katha Cabal, LCSW since 06/01/2021 12:00 AM     Problem: Emotional Distress      Goal: Emotional Health Supported: Manage depression; Manage Alcohol Usage   Start Date: 06/01/2021  Expected End Date: 08/29/2021  This Visit's Progress: On track  Recent Progress: Not on track  Priority: High  Note:   Current Barriers:  Chronic Mental Health needs related to management of anxiety and stress issues Suicidal Ideation/Homicidal Ideation: No Hx of Alcohol Use/Abuse  Clinical Social Work Goal(s):  patient will work with SW monthly by telephone or in person to reduce or manage symptoms related to anxiety and stress issues faced Client will call LCSW in next 30 days as needed to discuss LCSW support with CCM program  Interventions: 1:1 collaboration with  Stacy Balloon, FNP regarding development and update of comprehensive plan of care as evidenced by provider attestation and co-signature LCSW spoke via phone today Stacy Dalton to discuss client  needs Discussed family support (her son , Stacy Dalton, is supportive; her son, Stacy Dalton, is supportive) Discussed client appetite. She said she has reduced appetite. She has had nausea for a few days.She said nausea is now starting to improve. Discussed fall history of client with Stacy Dalton. She said she had not had any recent falls.  Reviewed walking challenges with client. She said she uses a walker to help her walk. She said sometimes she gets short of breath when walking Discussed alcohol use of client. She said she recently discharged from the hospital. She said she has been home about two days and has poured out some alcohol from her home. LCSW  congratulated client on trying to refrain from alcohol use. She said she is trying to drink ginger ale and eat a few crackers since she has had nausea. She said tremors were starting to improve. She said she gets calls regularly from her sons.  Reviewed pain issues. She spoke of back pain issues.  Encouraged client to call LCSW as needed for social work support. Discussed mood of client. She said she becomes anxious or stressed when she thinks of managing her health needs. She said she becomes anxious in dealing with some family conflict issues.   Discussed relaxation techniques. She said she likes to listen to music, watch TV or do word games  Patient Care Activities:  Takes medications as prescribed  Patient Coping Strengths:  Has family support from her son, Stacy Dalton  Patient Self Care Deficits:  Hx Alcohol Use/Abuse  Patient Goals:  - spend time or talk with others every day - practice relaxation or meditation daily - keep a calendar with appointment dates  Follow Up Plan: LCSW to call client or son, Stacy Dalton, on 07/29/21 at 9:00 AM to assess client needs       Norva Riffle.Abriana Saltos MSW, LCSW Licensed Clinical Social Worker [facility] Care Management [phone]

## 2021-06-01 NOTE — Patient Instructions (Addendum)
Visit Information  Patient Goals:  Manage My Emotions (Patient). Manage Anxiety and Stress Symptoms faced. Manage  Depression issues faced.  Timeframe:  Short-Term Goal Priority:  High Progress:  On Track Start Date :      06/01/21                  Expected End Date:  08/25/21                Follow Up Date 07/29/21 at 9:00 AM   Manage My Emotions (Patient) Manage Anxiety and Stress Symptoms Faced . Manage depression issues faced   Why is this important?   When you are stressed, down or upset, your body reacts too.  For example, your blood pressure may get higher; you may have a headache or stomachache.  When your emotions get the best of you, your body's ability to fight off cold and flu gets weak.  These steps will help you manage your emotions.     Patient Self Care Activities:  Takes medications as prescribed  Patient Coping Strengths:  Has some family support from her son, [name]  Patient Self Care Deficits:  Hx Alcohol Use/Abuse  Patient Goals:  - spend time or talk with others every day - practice relaxation or meditation daily - keep a calendar with appointment dates  Follow Up Plan: LCSW to call client or son, Neldon Newport, on 07/29/21 at 9:00 AM to assess client needs.   Norva Riffle.Nadie Fiumara MSW, LCSW Licensed Clinical Social Worker [facility] Care Management [phone]

## 2021-06-06 ENCOUNTER — Emergency Department (HOSPITAL_COMMUNITY): Payer: Medicare Other

## 2021-06-06 ENCOUNTER — Encounter (HOSPITAL_COMMUNITY): Payer: Self-pay | Admitting: *Deleted

## 2021-06-06 ENCOUNTER — Other Ambulatory Visit: Payer: Self-pay

## 2021-06-06 ENCOUNTER — Observation Stay (HOSPITAL_COMMUNITY): Payer: Medicare Other

## 2021-06-06 ENCOUNTER — Inpatient Hospital Stay (HOSPITAL_COMMUNITY)
Admission: EM | Admit: 2021-06-06 | Discharge: 2021-06-13 | DRG: 896 | Disposition: A | Discharge status: Home Health | Payer: Medicare Other | Attending: Internal Medicine | Admitting: Internal Medicine

## 2021-06-06 DIAGNOSIS — Z20822 Contact with and (suspected) exposure to covid-19: Secondary | ICD-10-CM | POA: Diagnosis present

## 2021-06-06 DIAGNOSIS — E039 Hypothyroidism, unspecified: Secondary | ICD-10-CM | POA: Diagnosis present

## 2021-06-06 DIAGNOSIS — N3000 Acute cystitis without hematuria: Secondary | ICD-10-CM | POA: Diagnosis present

## 2021-06-06 DIAGNOSIS — G4700 Insomnia, unspecified: Secondary | ICD-10-CM | POA: Diagnosis not present

## 2021-06-06 DIAGNOSIS — F10931 Alcohol use, unspecified with withdrawal delirium: Secondary | ICD-10-CM | POA: Diagnosis not present

## 2021-06-06 DIAGNOSIS — E440 Moderate protein-calorie malnutrition: Secondary | ICD-10-CM | POA: Diagnosis present

## 2021-06-06 DIAGNOSIS — F32A Depression, unspecified: Secondary | ICD-10-CM | POA: Diagnosis present

## 2021-06-06 DIAGNOSIS — E876 Hypokalemia: Secondary | ICD-10-CM | POA: Diagnosis present

## 2021-06-06 DIAGNOSIS — R404 Transient alteration of awareness: Secondary | ICD-10-CM | POA: Diagnosis not present

## 2021-06-06 DIAGNOSIS — Z90710 Acquired absence of both cervix and uterus: Secondary | ICD-10-CM | POA: Diagnosis not present

## 2021-06-06 DIAGNOSIS — N390 Urinary tract infection, site not specified: Secondary | ICD-10-CM | POA: Diagnosis not present

## 2021-06-06 DIAGNOSIS — F1010 Alcohol abuse, uncomplicated: Secondary | ICD-10-CM | POA: Diagnosis not present

## 2021-06-06 DIAGNOSIS — G9341 Metabolic encephalopathy: Secondary | ICD-10-CM | POA: Diagnosis present

## 2021-06-06 DIAGNOSIS — R569 Unspecified convulsions: Secondary | ICD-10-CM | POA: Diagnosis present

## 2021-06-06 DIAGNOSIS — Z8249 Family history of ischemic heart disease and other diseases of the circulatory system: Secondary | ICD-10-CM | POA: Diagnosis not present

## 2021-06-06 DIAGNOSIS — Z8349 Family history of other endocrine, nutritional and metabolic diseases: Secondary | ICD-10-CM | POA: Diagnosis not present

## 2021-06-06 DIAGNOSIS — Z1624 Resistance to multiple antibiotics: Secondary | ICD-10-CM | POA: Diagnosis present

## 2021-06-06 DIAGNOSIS — F1020 Alcohol dependence, uncomplicated: Secondary | ICD-10-CM | POA: Diagnosis not present

## 2021-06-06 DIAGNOSIS — Y900 Blood alcohol level of less than 20 mg/100 ml: Secondary | ICD-10-CM | POA: Diagnosis present

## 2021-06-06 DIAGNOSIS — J449 Chronic obstructive pulmonary disease, unspecified: Secondary | ICD-10-CM | POA: Diagnosis present

## 2021-06-06 DIAGNOSIS — Z800 Family history of malignant neoplasm of digestive organs: Secondary | ICD-10-CM | POA: Diagnosis not present

## 2021-06-06 DIAGNOSIS — N179 Acute kidney failure, unspecified: Secondary | ICD-10-CM | POA: Diagnosis present

## 2021-06-06 DIAGNOSIS — Z825 Family history of asthma and other chronic lower respiratory diseases: Secondary | ICD-10-CM

## 2021-06-06 DIAGNOSIS — F410 Panic disorder [episodic paroxysmal anxiety] without agoraphobia: Secondary | ICD-10-CM | POA: Diagnosis present

## 2021-06-06 DIAGNOSIS — R5381 Other malaise: Secondary | ICD-10-CM | POA: Diagnosis not present

## 2021-06-06 DIAGNOSIS — Z87891 Personal history of nicotine dependence: Secondary | ICD-10-CM | POA: Diagnosis not present

## 2021-06-06 DIAGNOSIS — G894 Chronic pain syndrome: Secondary | ICD-10-CM | POA: Diagnosis present

## 2021-06-06 DIAGNOSIS — K219 Gastro-esophageal reflux disease without esophagitis: Secondary | ICD-10-CM | POA: Diagnosis not present

## 2021-06-06 DIAGNOSIS — W19XXXA Unspecified fall, initial encounter: Secondary | ICD-10-CM | POA: Diagnosis not present

## 2021-06-06 DIAGNOSIS — Z833 Family history of diabetes mellitus: Secondary | ICD-10-CM | POA: Diagnosis not present

## 2021-06-06 DIAGNOSIS — Z823 Family history of stroke: Secondary | ICD-10-CM

## 2021-06-06 DIAGNOSIS — F10231 Alcohol dependence with withdrawal delirium: Secondary | ICD-10-CM | POA: Diagnosis not present

## 2021-06-06 DIAGNOSIS — R4182 Altered mental status, unspecified: Secondary | ICD-10-CM | POA: Diagnosis not present

## 2021-06-06 DIAGNOSIS — I10 Essential (primary) hypertension: Secondary | ICD-10-CM | POA: Diagnosis present

## 2021-06-06 DIAGNOSIS — G8929 Other chronic pain: Secondary | ICD-10-CM | POA: Diagnosis not present

## 2021-06-06 DIAGNOSIS — R29810 Facial weakness: Secondary | ICD-10-CM | POA: Diagnosis present

## 2021-06-06 DIAGNOSIS — S0990XA Unspecified injury of head, initial encounter: Secondary | ICD-10-CM | POA: Diagnosis not present

## 2021-06-06 DIAGNOSIS — R000 Tachycardia, unspecified: Secondary | ICD-10-CM | POA: Diagnosis not present

## 2021-06-06 DIAGNOSIS — Z6821 Body mass index (BMI) 21.0-21.9, adult: Secondary | ICD-10-CM

## 2021-06-06 DIAGNOSIS — R29890 Loss of height: Secondary | ICD-10-CM | POA: Diagnosis not present

## 2021-06-06 DIAGNOSIS — R338 Other retention of urine: Secondary | ICD-10-CM | POA: Diagnosis not present

## 2021-06-06 DIAGNOSIS — R296 Repeated falls: Secondary | ICD-10-CM | POA: Diagnosis present

## 2021-06-06 DIAGNOSIS — E860 Dehydration: Secondary | ICD-10-CM | POA: Diagnosis present

## 2021-06-06 LAB — CBC
HCT: 37.8 % (ref 36.0–46.0)
Hemoglobin: 12.2 g/dL (ref 12.0–15.0)
MCH: 28.7 pg (ref 26.0–34.0)
MCHC: 32.3 g/dL (ref 30.0–36.0)
MCV: 88.9 fL (ref 80.0–100.0)
Platelets: 299 10*3/uL (ref 150–400)
RBC: 4.25 MIL/uL (ref 3.87–5.11)
RDW: 15.7 % — ABNORMAL HIGH (ref 11.5–15.5)
WBC: 7.6 10*3/uL (ref 4.0–10.5)
nRBC: 0 % (ref 0.0–0.2)

## 2021-06-06 LAB — URINALYSIS, ROUTINE W REFLEX MICROSCOPIC
Bilirubin Urine: NEGATIVE
Glucose, UA: NEGATIVE mg/dL
Ketones, ur: NEGATIVE mg/dL
Nitrite: NEGATIVE
Protein, ur: NEGATIVE mg/dL
Specific Gravity, Urine: 1.005 (ref 1.005–1.030)
pH: 7 (ref 5.0–8.0)

## 2021-06-06 LAB — ACETAMINOPHEN LEVEL: Acetaminophen (Tylenol), Serum: <10 ug/mL — ABNORMAL LOW (ref 10–30)

## 2021-06-06 LAB — COMPREHENSIVE METABOLIC PANEL
ALT: 17 U/L (ref 0–44)
AST: 34 U/L (ref 15–41)
Albumin: 4.5 g/dL (ref 3.5–5.0)
Alkaline Phosphatase: 76 U/L (ref 38–126)
Anion gap: 13 (ref 5–15)
BUN: 29 mg/dL — ABNORMAL HIGH (ref 8–23)
CO2: 28 mmol/L (ref 22–32)
Calcium: 9.6 mg/dL (ref 8.9–10.3)
Chloride: 91 mmol/L — ABNORMAL LOW (ref 98–111)
Creatinine, Ser: 1.39 mg/dL — ABNORMAL HIGH (ref 0.44–1.00)
GFR, Estimated: 40 mL/min — ABNORMAL LOW (ref >60)
Glucose, Bld: 133 mg/dL — ABNORMAL HIGH (ref 70–99)
Potassium: 3 mmol/L — ABNORMAL LOW (ref 3.5–5.1)
Sodium: 132 mmol/L — ABNORMAL LOW (ref 135–145)
Total Bilirubin: 0.4 mg/dL (ref 0.3–1.2)
Total Protein: 7.8 g/dL (ref 6.5–8.1)

## 2021-06-06 LAB — MRSA NEXT GEN BY PCR, NASAL: MRSA by PCR Next Gen: NOT DETECTED

## 2021-06-06 LAB — RAPID URINE DRUG SCREEN, HOSP PERFORMED
Amphetamines: NOT DETECTED
Barbiturates: NOT DETECTED
Benzodiazepines: NOT DETECTED
Cocaine: NOT DETECTED
Opiates: NOT DETECTED
Tetrahydrocannabinol: NOT DETECTED

## 2021-06-06 LAB — DIFFERENTIAL
Abs Immature Granulocytes: 0.06 10*3/uL (ref 0.00–0.07)
Basophils Absolute: 0 10*3/uL (ref 0.0–0.1)
Basophils Relative: 0 %
Eosinophils Absolute: 0.2 10*3/uL (ref 0.0–0.5)
Eosinophils Relative: 3 %
Immature Granulocytes: 1 %
Lymphocytes Relative: 18 %
Lymphs Abs: 1.3 10*3/uL (ref 0.7–4.0)
Monocytes Absolute: 1 10*3/uL (ref 0.1–1.0)
Monocytes Relative: 14 %
Neutro Abs: 5 10*3/uL (ref 1.7–7.7)
Neutrophils Relative %: 64 %

## 2021-06-06 LAB — RESP PANEL BY RT-PCR (FLU A&B, COVID) ARPGX2
Influenza A by PCR: NEGATIVE
Influenza B by PCR: NEGATIVE
SARS Coronavirus 2 by RT PCR: NEGATIVE

## 2021-06-06 LAB — LACTIC ACID, PLASMA: Lactic Acid, Venous: 1.7 mmol/L (ref 0.5–1.9)

## 2021-06-06 LAB — MAGNESIUM: Magnesium: 1.9 mg/dL (ref 1.7–2.4)

## 2021-06-06 LAB — PROTIME-INR
INR: 0.9 (ref 0.8–1.2)
Prothrombin Time: 11.6 seconds (ref 11.4–15.2)

## 2021-06-06 LAB — AMMONIA: Ammonia: 37 umol/L — ABNORMAL HIGH (ref 9–35)

## 2021-06-06 LAB — APTT: aPTT: 23 seconds — ABNORMAL LOW (ref 24–36)

## 2021-06-06 LAB — CBG MONITORING, ED: Glucose-Capillary: 131 mg/dL — ABNORMAL HIGH (ref 70–99)

## 2021-06-06 LAB — SALICYLATE LEVEL: Salicylate Lvl: <7 mg/dL — ABNORMAL LOW (ref 7.0–30.0)

## 2021-06-06 LAB — ETHANOL: Alcohol, Ethyl (B): <10 mg/dL (ref <10)

## 2021-06-06 MED ORDER — TRAZODONE HCL 50 MG PO TABS
50.0000 mg | ORAL_TABLET | Freq: Every evening | ORAL | Status: DC | PRN
  Administered 2021-06-08: 50 mg via ORAL
  Filled 2021-06-06: qty 1

## 2021-06-06 MED ORDER — ONDANSETRON HCL 4 MG PO TABS
4.0000 mg | ORAL_TABLET | Freq: Four times a day (QID) | ORAL | Status: DC | PRN
  Administered 2021-06-11 – 2021-06-13 (×5): 4 mg via ORAL
  Filled 2021-06-06 (×5): qty 1

## 2021-06-06 MED ORDER — MAGNESIUM OXIDE -MG SUPPLEMENT 400 (240 MG) MG PO TABS
400.0000 mg | ORAL_TABLET | Freq: Every day | ORAL | Status: DC
  Administered 2021-06-08 – 2021-06-13 (×6): 400 mg via ORAL
  Filled 2021-06-06 (×6): qty 1

## 2021-06-06 MED ORDER — DIAZEPAM 2 MG PO TABS
2.0000 mg | ORAL_TABLET | Freq: Three times a day (TID) | ORAL | Status: AC
  Administered 2021-06-07 (×2): 2 mg via ORAL
  Filled 2021-06-06 (×2): qty 1

## 2021-06-06 MED ORDER — LORAZEPAM 2 MG/ML IJ SOLN
1.0000 mg | INTRAMUSCULAR | Status: DC | PRN
  Administered 2021-06-06: 2 mg via INTRAVENOUS
  Filled 2021-06-06: qty 1

## 2021-06-06 MED ORDER — DEXMEDETOMIDINE HCL IN NACL 400 MCG/100ML IV SOLN
0.4000 ug/kg/h | INTRAVENOUS | Status: DC
  Administered 2021-06-06 – 2021-06-08 (×3): 0.6 ug/kg/h via INTRAVENOUS
  Filled 2021-06-06 (×3): qty 100

## 2021-06-06 MED ORDER — THIAMINE HCL 100 MG PO TABS
100.0000 mg | ORAL_TABLET | Freq: Every day | ORAL | Status: DC
  Administered 2021-06-08 – 2021-06-13 (×6): 100 mg via ORAL
  Filled 2021-06-06 (×6): qty 1

## 2021-06-06 MED ORDER — POTASSIUM CHLORIDE CRYS ER 20 MEQ PO TBCR
40.0000 meq | EXTENDED_RELEASE_TABLET | ORAL | Status: AC
  Filled 2021-06-06: qty 2

## 2021-06-06 MED ORDER — FOLIC ACID 1 MG PO TABS
1.0000 mg | ORAL_TABLET | Freq: Every day | ORAL | Status: DC
  Administered 2021-06-08 – 2021-06-13 (×6): 1 mg via ORAL
  Filled 2021-06-06 (×6): qty 1

## 2021-06-06 MED ORDER — SODIUM CHLORIDE 0.9 % IV SOLN: 250.0000 mL | INTRAVENOUS | Status: DC | PRN

## 2021-06-06 MED ORDER — SODIUM CHLORIDE 0.9 % IV SOLN
1.0000 g | INTRAVENOUS | Status: DC
  Administered 2021-06-06 – 2021-06-08 (×3): 1 g via INTRAVENOUS
  Filled 2021-06-06 (×3): qty 10

## 2021-06-06 MED ORDER — BISACODYL 10 MG RE SUPP: 10.0000 mg | Freq: Every day | RECTAL | Status: DC | PRN

## 2021-06-06 MED ORDER — CHLORHEXIDINE GLUCONATE CLOTH 2 % EX PADS
6.0000 | MEDICATED_PAD | Freq: Every day | CUTANEOUS | Status: DC
Start: 2021-06-07 — End: 2021-06-10
  Administered 2021-06-07 – 2021-06-09 (×2): 6 via TOPICAL

## 2021-06-06 MED ORDER — DIAZEPAM 2 MG PO TABS: 2.0000 mg | ORAL_TABLET | Freq: Three times a day (TID) | ORAL | Status: DC

## 2021-06-06 MED ORDER — MOMETASONE FURO-FORMOTEROL FUM 200-5 MCG/ACT IN AERO
2.0000 | INHALATION_SPRAY | Freq: Two times a day (BID) | RESPIRATORY_TRACT | Status: DC
  Administered 2021-06-07 – 2021-06-08 (×3): 2 via RESPIRATORY_TRACT
  Filled 2021-06-06: qty 8.8

## 2021-06-06 MED ORDER — HEPARIN SODIUM (PORCINE) 5000 UNIT/ML IJ SOLN
5000.0000 [IU] | Freq: Three times a day (TID) | INTRAMUSCULAR | Status: DC
  Administered 2021-06-06 – 2021-06-13 (×21): 5000 [IU] via SUBCUTANEOUS
  Filled 2021-06-06 (×21): qty 1

## 2021-06-06 MED ORDER — ONDANSETRON HCL 4 MG/2ML IJ SOLN
4.0000 mg | Freq: Four times a day (QID) | INTRAMUSCULAR | Status: DC | PRN
  Administered 2021-06-10 – 2021-06-13 (×5): 4 mg via INTRAVENOUS
  Filled 2021-06-06 (×5): qty 2

## 2021-06-06 MED ORDER — LORAZEPAM 2 MG/ML IJ SOLN
1.0000 mg | Freq: Once | INTRAMUSCULAR | Status: AC
  Administered 2021-06-06: 1 mg via INTRAVENOUS
  Filled 2021-06-06: qty 1

## 2021-06-06 MED ORDER — SODIUM CHLORIDE 0.9% FLUSH: 3.0000 mL | INTRAVENOUS | Status: DC | PRN

## 2021-06-06 MED ORDER — ACETAMINOPHEN 325 MG PO TABS
650.0000 mg | ORAL_TABLET | Freq: Four times a day (QID) | ORAL | Status: DC | PRN
  Administered 2021-06-08 – 2021-06-10 (×3): 650 mg via ORAL
  Filled 2021-06-06 (×3): qty 2

## 2021-06-06 MED ORDER — LORAZEPAM 1 MG PO TABS: 1.0000 mg | ORAL_TABLET | ORAL | Status: DC | PRN

## 2021-06-06 MED ORDER — SODIUM CHLORIDE 0.9% FLUSH
3.0000 mL | Freq: Two times a day (BID) | INTRAVENOUS | Status: DC
  Administered 2021-06-06 – 2021-06-13 (×11): 3 mL via INTRAVENOUS

## 2021-06-06 MED ORDER — LORAZEPAM 2 MG/ML IJ SOLN
INTRAMUSCULAR | Status: AC
  Administered 2021-06-06: 4 mg via INTRAVENOUS
  Filled 2021-06-06: qty 2

## 2021-06-06 MED ORDER — AMLODIPINE BESYLATE 5 MG PO TABS
5.0000 mg | ORAL_TABLET | Freq: Every day | ORAL | Status: DC
  Administered 2021-06-08 – 2021-06-13 (×6): 5 mg via ORAL
  Filled 2021-06-06 (×7): qty 1

## 2021-06-06 MED ORDER — SODIUM CHLORIDE 0.9% FLUSH
3.0000 mL | Freq: Two times a day (BID) | INTRAVENOUS | Status: DC
  Administered 2021-06-06 – 2021-06-11 (×7): 3 mL via INTRAVENOUS

## 2021-06-06 MED ORDER — SODIUM CHLORIDE 0.9 % IV BOLUS
1000.0000 mL | Freq: Once | INTRAVENOUS | Status: AC
  Administered 2021-06-06: 1000 mL via INTRAVENOUS

## 2021-06-06 MED ORDER — ACETAMINOPHEN 650 MG RE SUPP: 650.0000 mg | Freq: Four times a day (QID) | RECTAL | Status: DC | PRN

## 2021-06-06 MED ORDER — VITAMIN D (ERGOCALCIFEROL) 1.25 MG (50000 UNIT) PO CAPS
50000.0000 [IU] | ORAL_CAPSULE | ORAL | Status: DC
  Filled 2021-06-06 (×5): qty 1

## 2021-06-06 MED ORDER — DEXTROSE-NACL 5-0.45 % IV SOLN: INTRAVENOUS | Status: DC

## 2021-06-06 MED ORDER — DEXMEDETOMIDINE HCL IN NACL 200 MCG/50ML IV SOLN
0.4000 ug/kg/h | INTRAVENOUS | Status: DC
  Administered 2021-06-06: 0.4 ug/kg/h via INTRAVENOUS
  Filled 2021-06-06: qty 50

## 2021-06-06 MED ORDER — FERROUS SULFATE 325 (65 FE) MG PO TABS
325.0000 mg | ORAL_TABLET | Freq: Every day | ORAL | Status: DC
  Administered 2021-06-08 – 2021-06-13 (×6): 325 mg via ORAL
  Filled 2021-06-06 (×6): qty 1

## 2021-06-06 MED ORDER — LORAZEPAM 2 MG/ML IJ SOLN: 4.0000 mg | Freq: Once | INTRAMUSCULAR | Status: AC

## 2021-06-06 MED ORDER — ADULT MULTIVITAMIN W/MINERALS CH
1.0000 | ORAL_TABLET | Freq: Every day | ORAL | Status: DC
  Administered 2021-06-08 – 2021-06-13 (×4): 1 via ORAL
  Filled 2021-06-06 (×7): qty 1

## 2021-06-06 MED ORDER — LEVOTHYROXINE SODIUM 50 MCG PO TABS
50.0000 ug | ORAL_TABLET | Freq: Every day | ORAL | Status: DC
Start: 2021-06-07 — End: 2021-06-13
  Administered 2021-06-07 – 2021-06-13 (×7): 50 ug via ORAL
  Filled 2021-06-06 (×4): qty 1
  Filled 2021-06-06 (×2): qty 2
  Filled 2021-06-06: qty 1

## 2021-06-06 MED ORDER — POTASSIUM CHLORIDE 10 MEQ/100ML IV SOLN
10.0000 meq | INTRAVENOUS | Status: AC
  Administered 2021-06-06 (×4): 10 meq via INTRAVENOUS
  Filled 2021-06-06 (×4): qty 100

## 2021-06-06 MED ORDER — THIAMINE HCL 100 MG/ML IJ SOLN
100.0000 mg | Freq: Every day | INTRAMUSCULAR | Status: DC
  Administered 2021-06-07: 100 mg via INTRAVENOUS
  Filled 2021-06-06 (×3): qty 2

## 2021-06-06 MED ORDER — POLYETHYLENE GLYCOL 3350 17 G PO PACK: 17.0000 g | PACK | Freq: Every day | ORAL | Status: DC | PRN

## 2021-06-06 MED ORDER — VITAMIN B-12 1000 MCG PO TABS
1000.0000 ug | ORAL_TABLET | Freq: Every day | ORAL | Status: DC
  Administered 2021-06-08 – 2021-06-13 (×6): 1000 ug via ORAL
  Filled 2021-06-06 (×6): qty 1

## 2021-06-06 NOTE — ED Provider Notes (Signed)
Signout from Dr. Sedonia Small.  70 year old female presenting after seizure per EMS.  Patient was a former heavy alcoholic although neighbor who is here now providing history states she has not drank in a few weeks.  2 falls early this morning.  Patient confused not at baseline.  She denies any complaints. Physical Exam  BP (!) 154/90   Pulse 88   Temp 98 F (36.7 C) (Oral)   Resp 16   Ht 5\' 1"  (1.549 m)   Wt 50.8 kg   SpO2 99%   BMI 21.16 kg/m   Physical Exam  ED Course/Procedures     Procedures  MDM  Plan is to follow-up on labs.  She possibly had a stroke has a new left facial droop.  Tremor symmetric bilaterally of hands.  Labs show AKI low potassium.  Discussed with [facility] hospitalist Dr. Denton Brick who will evaluate the patient for admission.       Hayden Rasmussen, MD 06/06/21 [phone]

## 2021-06-06 NOTE — ED Notes (Signed)
Patient to MRI at this time.

## 2021-06-06 NOTE — ED Notes (Signed)
Patient with seizure like activity. Seizure pads are on bed. Placed on oxygen via Day.

## 2021-06-06 NOTE — ED Notes (Signed)
Patient transported to CT 

## 2021-06-06 NOTE — ED Triage Notes (Addendum)
Pt arrived to er by ems after being called out for falls, upon arrival to pt on scene, pt had seizure type activity with changes in LOC. CBG 145 with ems, family member reports that pt quit drinking 11 days ago, upon arrival to er pt confused, unsure of last known well time, ex husband reports that pt has not been the same for the past 11 days, Dr Sedonia Small at bedside,

## 2021-06-06 NOTE — ED Notes (Signed)
Patient unable to swallow PO meds. MD aware.

## 2021-06-06 NOTE — H&P (Addendum)
Patient Demographics:    Stacy Dalton, is a 70 y.o. female  MRN: [id number]   DOB - 27-Sep-1950  Admit Date - 06/06/2021  Outpatient Primary MD for the patient is [name], FNP   Assessment & Plan:    Principal Problem:   DTs (delirium tremens) ([place]) Active Problems:   COPD (chronic obstructive pulmonary disease) ([place])   Hypothyroidism   Alcohol use disorder, severe, dependence ([place])   AKI (acute kidney injury) ([place])    1)DTs- -She had become increasingly more confused over the last few days -.,  CT head, CT C-spine and MRI brain without acute finding -Despite multiple doses of IV lorazepam patient remains confused and agitated and required transition to IV Precedex drip -Continue folic acid thiamine and multivitamin -Ammonia is 37 BAL < 10, UDS negative  2) hypokalemia-Potassium is low at 3.0, magnesium WNL -Replace and recheck  3)UTI--UA suspicious for UTI IV Rocephin pending urine culture  4) hypothyroidism--- continue levothyroxine  5)HTN--restart amlodipine and   may use IV labetalol when necessary  Every 4 hours for systolic blood pressure over 160 mmhg  6) COPD/tobacco abuse----no acute exacerbation at this time, bronchodilators as ordered  7)AKI----acute kidney injury - creatinine is up to 1.39 baseline usually around 0.8 suspect dehydration related due to poor oral intake in the setting of alcohol withdrawal and confusion - renally adjust medications, avoid nephrotoxic agents / dehydration  / hypotension      Disposition/Need for in-Hospital Stay- patient unable to be discharged at this time due to delirium tremens requiring IV Precedex drip  Status is: Inpatient  Remains inpatient appropriate because: Disposition above  Dispo: The patient is from: Home               Anticipated d/c is to: Home              Anticipated d/c date is: 3 days              Patient currently is not medically stable to d/c. Barriers: Not Clinically Stable-    With History of - Reviewed by me  Past Medical History:  Diagnosis Date   Chronic pain    COPD (chronic obstructive pulmonary disease) ([place])    told has copd, no current inhaler use   Cough    Depression    ETOH abuse    GERD (gastroesophageal reflux disease)    Hypertension    Hypothyroidism    Insomnia    Migraine    Migraine    Osteopenia    Pain management    Panic attacks    Small bowel obstruction ([place])       Past Surgical History:  Procedure Laterality Date   ABDOMINAL HYSTERECTOMY     COLON SURGERY     COLOSTOMY CLOSURE  04/19/2012   Procedure: COLOSTOMY CLOSURE;  Surgeon: Adin Hector, MD;  Location: [facility];  Service: General;  Laterality: N/A;  Laparotomy, Resection  and Closure of Colostomy   COLOSTOMY TAKEDOWN N/A 04/12/2016   Procedure: [place] TAKEDOWN;  Surgeon: Johnathan Hausen, MD;  Location: [facility];  Service: General;  Laterality: N/A;   INCONTINENCE SURGERY     LAPAROTOMY  10/04/2011, colostomy also   Procedure: EXPLORATORY LAPAROTOMY;  Surgeon: Adin Hector, MD;  Location: [facility];  Service: General;  Laterality: N/A;  left partial colectomy with colostomy   LAPAROTOMY  04/19/2012   Procedure: EXPLORATORY LAPAROTOMY;  Surgeon: Adin Hector, MD;  Location: [facility];  Service: General;  Laterality: N/A;   LAPAROTOMY N/A 11/29/2015   Procedure: EXPLORATORY LAPAROTOMY; SUBTOTAL COLECTOMY WITH HARTMAN PROCEDURE AND END COLOSTOMY;  Surgeon: Johnathan Hausen, MD;  Location: [facility];  Service: General;  Laterality: N/A;   TUBAL LIGATION     VENTRAL HERNIA REPAIR  04/19/2012   Procedure: HERNIA REPAIR VENTRAL ADULT;  Surgeon: Adin Hector, MD;  Location: [facility];  Service: General;  Laterality: N/A;      Chief Complaint  Patient presents with   Seizures      HPI:    Stacy Dalton  is a 70 y.o. female with significant for severe alcohol dependence, COPD, hypertension, hypothyroidism, chronic pain, depression, and anxiety presented to the ER by EMS after recurrent falls at home, EMS was concerned about possible seizures with confusion possible LOS -Patient apparently quit drinking alcohol on 05/30/2021-- -She had become increasingly more confused over the last few days -No Vomiting noted no fevers noted -Patient female neighbor is at bedside -.,  CT head, CT C-spine and MRI brain without acute finding -Patient unable to give any relevant history at this time -Despite multiple doses of IV lorazepam patient remains confused and agitated and required transition to IV Precedex drip -Ammonia is 37 BAL < 10 -CBC is essentially WNL -Potassium is low at 3.0, sodium is 132 chloride is 91, UDS negative -UA suspicious for UTI    Review of systems:    In addition to the HPI above,   A full Review of  Systems was done, all other systems reviewed are negative except as noted above in HPI , .    Social History:  Reviewed by me    Social History   Tobacco Use   Smoking status: Former    Packs/day: 1.50    Years: 20.00    Pack years: 30.00    Types: Cigarettes    Quit date: 10/04/1998    Years since quitting: 22.6   Smokeless tobacco: Never  Substance Use Topics   Alcohol use: Yes    Comment: 1bottle vodka -3 days. As of 03/01/21 pt  states she has quit drinking"for several days."       Family History :  Reviewed by me    Family History  Problem Relation Age of Onset   Heart disease Mother    Hypertension Mother    Cancer Sister        sinus   Diabetes Brother    Heart disease Brother    Thyroid disease Brother    Cancer Sister        abdominal ?   Thyroid disease Sister    Cancer Other        GE junction adenocarcinoma   Emphysema Father    Stroke Father    Hypertension Father    Heart disease Father    COPD Sister    Thyroid disease  Sister    Thyroid disease Sister    Diabetes Brother  Thyroid disease Brother    Thyroid disease Brother    Hypertension Brother    Post-traumatic stress disorder Brother    COPD Brother    Thyroid disease Brother    Thyroid disease Brother    Hypertension Brother    Transient ischemic attack Brother      Home Medications:   Prior to Admission medications   Medication Sig Start Date End Date Taking? Authorizing Provider  amLODipine (NORVASC) 5 MG tablet Take 1 tablet (5 mg total) by mouth daily. 05/22/21   Regalado, Belkys A, MD  famotidine (PEPCID) 20 MG tablet Take 20 mg by mouth 2 (two) times daily. Patient not taking: Reported on 05/25/2021    [provider]  ferrous sulfate 325 (65 FE) MG tablet Take 1 tablet (325 mg total) by mouth daily with breakfast. Patient not taking: Reported on 05/25/2021 04/20/21 05/20/21  Deatra James, MD  fluticasone-salmeterol (ADVAIR DISKUS) 250-50 MCG/ACT AEPB Inhale 1 puff into the lungs in the morning and at bedtime. Patient not taking: Reported on 05/25/2021 04/19/21 05/20/21  Deatra James, MD  folic acid (FOLVITE) 1 MG tablet Take 1 tablet (1 mg total) by mouth daily. Patient not taking: Reported on 05/25/2021 05/22/21   Niel Hummer A, MD  levothyroxine (SYNTHROID) 50 MCG tablet Take 1 tablet (50 mcg total) by mouth daily before breakfast. 06/22/20   [name], FNP  LORazepam (ATIVAN) 1 MG tablet Take 1 tablet (1 mg total) by mouth 3 (three) times daily as needed (Alcohol withdrawal symptoms). 05/26/21   Daleen Bo, MD  Magnesium Oxide 400 MG CAPS Take 1 capsule (400 mg total) by mouth daily. 05/24/21 06/23/21  Wyvonnia Dusky, MD  metoprolol tartrate (LOPRESSOR) 25 MG tablet Take 1 tablet (25 mg total) by mouth 2 (two) times daily. Patient not taking: Reported on 05/25/2021 04/19/21 05/20/21  Deatra James, MD  Multiple Vitamin (MULTIVITAMIN WITH MINERALS) TABS tablet Take 1 tablet by mouth  daily. Patient not taking: Reported on 05/25/2021 05/22/21   Regalado, Jerald Kief A, MD  ondansetron (ZOFRAN) 4 MG tablet Take 1 tablet (4 mg total) by mouth every 8 (eight) hours as needed for nausea or vomiting. Patient not taking: Reported on 05/25/2021 03/24/21   Evelina Dun A, FNP  thiamine 100 MG tablet Take 1 tablet (100 mg total) by mouth daily. Patient not taking: Reported on 05/25/2021 05/22/21   Regalado, Jerald Kief A, MD  traMADol (ULTRAM) 50 MG tablet Take 50 mg by mouth every 6 (six) hours as needed for pain. for pain Patient not taking: Reported on 05/20/2021 04/29/21   [provider]  vitamin B-12 (CYANOCOBALAMIN) 1000 MCG tablet Take 1 tablet (1,000 mcg total) by mouth daily. Patient not taking: Reported on 05/25/2021 05/21/21   Regalado, Jerald Kief A, MD  Vitamin D, Ergocalciferol, (DRISDOL) 1.25 MG (50000 UNIT) CAPS capsule Take 1 capsule (50,000 Units total) by mouth every 7 (seven) days. Patient not taking: Reported on 05/25/2021 06/22/20   [name], FNP     Allergies:     Allergies  Allergen Reactions   Librium [Chlordiazepoxide]     Became unresponsive   Toradol [Ketorolac Tromethamine] Shortness Of Breath   Butalbital-Apap-Caffeine Other (See Comments)    jittery   Demerol Swelling   Esgic [Butalbital-Apap-Caffeine] Other (See Comments)    jittery   Iodine Other (See Comments)    bp bottomed out per pt several hours later; unsure if pre medicated in past with cm; [facility]  Physical Exam:   Vitals  Blood pressure (!) 151/72, pulse 60, temperature 98 F (36.7 C), temperature source Axillary, resp. rate 13, height (P) 5\' 1"  (1.549 m), weight 56.5 kg, SpO2 100 %.  Physical Examination: General appearance -, disoriented  mental status -confused, agitated from time to time  eyes - sclera anicteric Neck - supple, no JVD elevation , Chest - clear  to auscultation bilaterally, symmetrical air movement,  Heart - S1 and S2 normal, regular   Abdomen - soft, nontender, nondistended, no masses or organomegaly Neurological -Limited nEURO exam as patient is very confused, agitated and uncooperative  extremities - no pedal edema noted, intact peripheral pulses  Skin - warm, dry     Data Review:    CBC Recent Labs  Lab 06/06/21 0601  WBC 7.6  HGB 12.2  HCT 37.8  PLT 299  MCV 88.9  MCH 28.7  MCHC 32.3  RDW 15.7*  LYMPHSABS 1.3  MONOABS 1.0  EOSABS 0.2  BASOSABS 0.0   ------------------------------------------------------------------------------------------------------------------  Chemistries  Recent Labs  Lab 06/06/21 0601  NA 132*  K 3.0*  CL 91*  CO2 28  GLUCOSE 133*  BUN 29*  CREATININE 1.39*  CALCIUM 9.6  MG 1.9  AST 34  ALT 17  ALKPHOS 76  BILITOT 0.4   ------------------------------------------------------------------------------------------------------------------ estimated creatinine clearance is 27.6 mL/min (A) (by C-G formula based on SCr of 1.39 mg/dL (H)). ------------------------------------------------------------------------------------------------------------------ No results for input(s): TSH, T4TOTAL, T3FREE, THYROIDAB in the last 72 hours.  Invalid input(s): FREET3   Coagulation profile Recent Labs  Lab 06/06/21 0601  INR 0.9   ------------------------------------------------------------------------------------------------------------------- No results for input(s): DDIMER in the last 72 hours. -------------------------------------------------------------------------------------------------------------------  Cardiac Enzymes No results for input(s): CKMB, TROPONINI, MYOGLOBIN in the last 168 hours.  Invalid input(s): CK ------------------------------------------------------------------------------------------------------------------    Component Value Date/Time   BNP 22.0 04/14/2021 0842      ---------------------------------------------------------------------------------------------------------------  Urinalysis    Component Value Date/Time   COLORURINE YELLOW 06/06/2021 0601   APPEARANCEUR CLEAR 06/06/2021 0601   APPEARANCEUR Cloudy (A) 02/23/2021 1608   LABSPEC 1.005 06/06/2021 0601   PHURINE 7.0 06/06/2021 0601   GLUCOSEU NEGATIVE 06/06/2021 0601   HGBUR SMALL (A) 06/06/2021 0601   BILIRUBINUR NEGATIVE 06/06/2021 0601   BILIRUBINUR Negative 02/23/2021 1608   KETONESUR NEGATIVE 06/06/2021 0601   PROTEINUR NEGATIVE 06/06/2021 0601   UROBILINOGEN negative 07/01/2015 1541   UROBILINOGEN 0.2 10/07/2012 1928   NITRITE NEGATIVE 06/06/2021 0601   LEUKOCYTESUR MODERATE (A) 06/06/2021 0601    ----------------------------------------------------------------------------------------------------------------   Imaging Results:    CT Head Wo Contrast  Result Date: 06/06/2021 CLINICAL DATA:  Head trauma, minor (Age >= 65y); Neck trauma (Age >= 65y) EXAM: CT HEAD WITHOUT CONTRAST CT CERVICAL SPINE WITHOUT CONTRAST TECHNIQUE: Multidetector CT imaging of the head and cervical spine was performed following the standard protocol without intravenous contrast. Multiplanar CT image reconstructions of the cervical spine were also generated. COMPARISON:  April 14, 2021. FINDINGS: CT HEAD FINDINGS Brain: No evidence of acute large vascular territory infarction, hemorrhage, hydrocephalus, extra-axial collection or mass lesion/mass effect. Similar atrophy and chronic microvascular ischemic disease. Vascular: No hyperdense vessel identified. Calcific intracranial atherosclerosis Skull: No acute fracture. Sinuses/Orbits: No acute finding. Other: No mastoid effusions. CT CERVICAL SPINE FINDINGS Similar alignment. Similar reversal of normal cervical lordosis. Similar mild retrolisthesis of C5 on C6, favor degenerative etiology given marked degenerative change at this level. Rotation of C1 on C2.  Mild rotatory levocurvature. Skull base and vertebrae: No evidence of acute fracture. Vertebral  body heights are maintained. Soft tissues and spinal canal: No prevertebral fluid or swelling. No visible canal hematoma. Disc levels: Similar degenerative change, greatest at C5-C6 where there is disc height loss, endplate sclerosis, and posterior disc/osteophyte complex. Upper chest: Visualized lung apices are clear on motion limited evaluation. IMPRESSION: CT head: No evidence of acute intracranial abnormality. CT cervical spine : 1. No evidence of acute fracture. 2. Rotation of C1 on C2, likely positional in the absence of a fixed torticollis. 3. Similar C5-C6 degenerative change. Electronically Signed   By: Margaretha Sheffield M.D.   On: 06/06/2021 06:24   CT Cervical Spine Wo Contrast  Result Date: 06/06/2021 CLINICAL DATA:  Head trauma, minor (Age >= 65y); Neck trauma (Age >= 65y) EXAM: CT HEAD WITHOUT CONTRAST CT CERVICAL SPINE WITHOUT CONTRAST TECHNIQUE: Multidetector CT imaging of the head and cervical spine was performed following the standard protocol without intravenous contrast. Multiplanar CT image reconstructions of the cervical spine were also generated. COMPARISON:  April 14, 2021. FINDINGS: CT HEAD FINDINGS Brain: No evidence of acute large vascular territory infarction, hemorrhage, hydrocephalus, extra-axial collection or mass lesion/mass effect. Similar atrophy and chronic microvascular ischemic disease. Vascular: No hyperdense vessel identified. Calcific intracranial atherosclerosis Skull: No acute fracture. Sinuses/Orbits: No acute finding. Other: No mastoid effusions. CT CERVICAL SPINE FINDINGS Similar alignment. Similar reversal of normal cervical lordosis. Similar mild retrolisthesis of C5 on C6, favor degenerative etiology given marked degenerative change at this level. Rotation of C1 on C2. Mild rotatory levocurvature. Skull base and vertebrae: No evidence of acute fracture. Vertebral body  heights are maintained. Soft tissues and spinal canal: No prevertebral fluid or swelling. No visible canal hematoma. Disc levels: Similar degenerative change, greatest at C5-C6 where there is disc height loss, endplate sclerosis, and posterior disc/osteophyte complex. Upper chest: Visualized lung apices are clear on motion limited evaluation. IMPRESSION: CT head: No evidence of acute intracranial abnormality. CT cervical spine : 1. No evidence of acute fracture. 2. Rotation of C1 on C2, likely positional in the absence of a fixed torticollis. 3. Similar C5-C6 degenerative change. Electronically Signed   By: Margaretha Sheffield M.D.   On: 06/06/2021 06:24   MR BRAIN WO CONTRAST  Result Date: 06/06/2021 CLINICAL DATA:  Altered mental status, combative EXAM: MRI HEAD WITHOUT CONTRAST TECHNIQUE: Multiplanar, multiecho pulse sequences of the brain and surrounding structures were obtained without intravenous contrast. COMPARISON:  CT head obtained earlier the same day, MR head 11/24/2020 FINDINGS: The patient was combative and altered, and the exam could not be completed. Sagittal T1, axial T2, and axial and coronal diffusion-weighted images were obtained. Additionally, the sagittal T1 sequence is markedly motion degraded. Brain: There is no diffusion signal abnormality to suggest acute infarct. There is no definite evidence of intracranial hemorrhage or extra-axial fluid collection on the sequences provided. Parenchymal signal is normal on the T2 sequence. There is no midline shift. Vascular: The flow voids are grossly present but not well evaluated. Skull and upper cervical spine: Marrow is not well evaluated. Sinuses/Orbits: No definite acute finding. Other: None. IMPRESSION: 1. Incomplete and motion degraded study as above. 2. No diffusion-weighted signal abnormality to suggest acute infarct. No definite parenchymal signal abnormality on the axial T2 sequence provided. 3. Consider repeat study when the patient can  tolerate completing the exam. Electronically Signed   By: Valetta Mole M.D.   On: 06/06/2021 09:16    Radiological Exams on Admission: CT Head Wo Contrast  Result Date: 06/06/2021 CLINICAL DATA:  Head trauma, minor (  Age >= 65y); Neck trauma (Age >= 65y) EXAM: CT HEAD WITHOUT CONTRAST CT CERVICAL SPINE WITHOUT CONTRAST TECHNIQUE: Multidetector CT imaging of the head and cervical spine was performed following the standard protocol without intravenous contrast. Multiplanar CT image reconstructions of the cervical spine were also generated. COMPARISON:  April 14, 2021. FINDINGS: CT HEAD FINDINGS Brain: No evidence of acute large vascular territory infarction, hemorrhage, hydrocephalus, extra-axial collection or mass lesion/mass effect. Similar atrophy and chronic microvascular ischemic disease. Vascular: No hyperdense vessel identified. Calcific intracranial atherosclerosis Skull: No acute fracture. Sinuses/Orbits: No acute finding. Other: No mastoid effusions. CT CERVICAL SPINE FINDINGS Similar alignment. Similar reversal of normal cervical lordosis. Similar mild retrolisthesis of C5 on C6, favor degenerative etiology given marked degenerative change at this level. Rotation of C1 on C2. Mild rotatory levocurvature. Skull base and vertebrae: No evidence of acute fracture. Vertebral body heights are maintained. Soft tissues and spinal canal: No prevertebral fluid or swelling. No visible canal hematoma. Disc levels: Similar degenerative change, greatest at C5-C6 where there is disc height loss, endplate sclerosis, and posterior disc/osteophyte complex. Upper chest: Visualized lung apices are clear on motion limited evaluation. IMPRESSION: CT head: No evidence of acute intracranial abnormality. CT cervical spine : 1. No evidence of acute fracture. 2. Rotation of C1 on C2, likely positional in the absence of a fixed torticollis. 3. Similar C5-C6 degenerative change. Electronically Signed   By: Margaretha Sheffield M.D.    On: 06/06/2021 06:24   CT Cervical Spine Wo Contrast  Result Date: 06/06/2021 CLINICAL DATA:  Head trauma, minor (Age >= 65y); Neck trauma (Age >= 65y) EXAM: CT HEAD WITHOUT CONTRAST CT CERVICAL SPINE WITHOUT CONTRAST TECHNIQUE: Multidetector CT imaging of the head and cervical spine was performed following the standard protocol without intravenous contrast. Multiplanar CT image reconstructions of the cervical spine were also generated. COMPARISON:  April 14, 2021. FINDINGS: CT HEAD FINDINGS Brain: No evidence of acute large vascular territory infarction, hemorrhage, hydrocephalus, extra-axial collection or mass lesion/mass effect. Similar atrophy and chronic microvascular ischemic disease. Vascular: No hyperdense vessel identified. Calcific intracranial atherosclerosis Skull: No acute fracture. Sinuses/Orbits: No acute finding. Other: No mastoid effusions. CT CERVICAL SPINE FINDINGS Similar alignment. Similar reversal of normal cervical lordosis. Similar mild retrolisthesis of C5 on C6, favor degenerative etiology given marked degenerative change at this level. Rotation of C1 on C2. Mild rotatory levocurvature. Skull base and vertebrae: No evidence of acute fracture. Vertebral body heights are maintained. Soft tissues and spinal canal: No prevertebral fluid or swelling. No visible canal hematoma. Disc levels: Similar degenerative change, greatest at C5-C6 where there is disc height loss, endplate sclerosis, and posterior disc/osteophyte complex. Upper chest: Visualized lung apices are clear on motion limited evaluation. IMPRESSION: CT head: No evidence of acute intracranial abnormality. CT cervical spine : 1. No evidence of acute fracture. 2. Rotation of C1 on C2, likely positional in the absence of a fixed torticollis. 3. Similar C5-C6 degenerative change. Electronically Signed   By: Margaretha Sheffield M.D.   On: 06/06/2021 06:24   MR BRAIN WO CONTRAST  Result Date: 06/06/2021 CLINICAL DATA:  Altered  mental status, combative EXAM: MRI HEAD WITHOUT CONTRAST TECHNIQUE: Multiplanar, multiecho pulse sequences of the brain and surrounding structures were obtained without intravenous contrast. COMPARISON:  CT head obtained earlier the same day, MR head 11/24/2020 FINDINGS: The patient was combative and altered, and the exam could not be completed. Sagittal T1, axial T2, and axial and coronal diffusion-weighted images were obtained. Additionally, the sagittal T1 sequence  is markedly motion degraded. Brain: There is no diffusion signal abnormality to suggest acute infarct. There is no definite evidence of intracranial hemorrhage or extra-axial fluid collection on the sequences provided. Parenchymal signal is normal on the T2 sequence. There is no midline shift. Vascular: The flow voids are grossly present but not well evaluated. Skull and upper cervical spine: Marrow is not well evaluated. Sinuses/Orbits: No definite acute finding. Other: None. IMPRESSION: 1. Incomplete and motion degraded study as above. 2. No diffusion-weighted signal abnormality to suggest acute infarct. No definite parenchymal signal abnormality on the axial T2 sequence provided. 3. Consider repeat study when the patient can tolerate completing the exam. Electronically Signed   By: Valetta Mole M.D.   On: 06/06/2021 09:16    DVT Prophylaxis -SCD  AM Labs Ordered, also please review Full Orders  Family Communication: Admission, patients condition and plan of care including tests being ordered have been discussed with the patient /NEIGHBOR who indicate understanding and agree with the plan   Code Status - Full Code  Likely DC to  HOME  Condition   stable  Roxan Hockey M.D on 06/06/2021 at 7:23 PM Go to [email or web address] -  for contact info  [facility] - Office  [phone]

## 2021-06-06 NOTE — ED Notes (Signed)
Danton Sewer is contact person oldest son [phone]

## 2021-06-06 NOTE — ED Notes (Signed)
Hospitalist at bedside and states that patient will need to go to MRI now and will need Ativan 2mg  IV at this time.

## 2021-06-06 NOTE — ED Provider Notes (Signed)
[facility] Emergency Department Provider Note MRN:  [id number]  Arrival date & time: 06/06/21     Chief Complaint   Seizures   History of Present Illness   Stacy Dalton is a 70 y.o. year-old female with a history of COPD, alcohol use disorder presenting to the ED with chief complaint of seizure.  Quit drinking alcohol 11 days ago.  Has been acting abnormally since per family.  This evening was much worse, very confused.  Reportedly going in and out of consciousness with EMS.  Seizure activity noted by EMS.  I was unable to obtain an accurate HPI, PMH, or ROS due to the patient's altered mental status.  Level 5 caveat.  Review of Systems  Positive for altered mental status.  Patient's Health History    Past Medical History:  Diagnosis Date   Chronic pain    COPD (chronic obstructive pulmonary disease) ([place])    told has copd, no current inhaler use   Cough    Depression    ETOH abuse    GERD (gastroesophageal reflux disease)    Hypertension    Hypothyroidism    Insomnia    Migraine    Migraine    Osteopenia    Pain management    Panic attacks    Small bowel obstruction ([place])     Past Surgical History:  Procedure Laterality Date   ABDOMINAL HYSTERECTOMY     COLON SURGERY     COLOSTOMY CLOSURE  04/19/2012   Procedure: COLOSTOMY CLOSURE;  Surgeon: Adin Hector, MD;  Location: [facility];  Service: General;  Laterality: N/A;  Laparotomy, Resection and Closure of Colostomy   COLOSTOMY TAKEDOWN N/A 04/12/2016   Procedure: [place] TAKEDOWN;  Surgeon: Johnathan Hausen, MD;  Location: [facility];  Service: General;  Laterality: N/A;   INCONTINENCE SURGERY     LAPAROTOMY  10/04/2011, colostomy also   Procedure: EXPLORATORY LAPAROTOMY;  Surgeon: Adin Hector, MD;  Location: [facility];  Service: General;  Laterality: N/A;  left partial colectomy with colostomy   LAPAROTOMY  04/19/2012   Procedure: EXPLORATORY LAPAROTOMY;  Surgeon: Adin Hector, MD;   Location: [facility];  Service: General;  Laterality: N/A;   LAPAROTOMY N/A 11/29/2015   Procedure: EXPLORATORY LAPAROTOMY; SUBTOTAL COLECTOMY WITH HARTMAN PROCEDURE AND END COLOSTOMY;  Surgeon: Johnathan Hausen, MD;  Location: [facility];  Service: General;  Laterality: N/A;   TUBAL LIGATION     VENTRAL HERNIA REPAIR  04/19/2012   Procedure: HERNIA REPAIR VENTRAL ADULT;  Surgeon: Adin Hector, MD;  Location: [facility];  Service: General;  Laterality: N/A;    Family History  Problem Relation Age of Onset   Heart disease Mother    Hypertension Mother    Cancer Sister        sinus   Diabetes Brother    Heart disease Brother    Thyroid disease Brother    Cancer Sister        abdominal ?   Thyroid disease Sister    Cancer Other        GE junction adenocarcinoma   Emphysema Father    Stroke Father    Hypertension Father    Heart disease Father    COPD Sister    Thyroid disease Sister    Thyroid disease Sister    Diabetes Brother    Thyroid disease Brother    Thyroid disease Brother    Hypertension Brother    Post-traumatic stress disorder Brother  COPD Brother    Thyroid disease Brother    Thyroid disease Brother    Hypertension Brother    Transient ischemic attack Brother     Social History   Socioeconomic History   Marital status: Widowed    Spouse name: Not on file   Number of children: 5   Years of education: 8   Highest education level: 8th grade  Occupational History   Occupation: Retired  Tobacco Use   Smoking status: Former    Packs/day: 1.50    Years: 20.00    Pack years: 30.00    Types: Cigarettes    Quit date: 10/04/1998    Years since quitting: 22.6   Smokeless tobacco: Never  Vaping Use   Vaping Use: Never used  Substance and Sexual Activity   Alcohol use: Yes    Comment: 1bottle vodka -3 days. As of 03/01/21 pt  states she has quit drinking"for several days."   Drug use: No   Sexual activity: Yes  Other Topics Concern   Not on file  Social History  Narrative   Pt lives alone. States 2 children live near by.   Social Determinants of Health   Financial Resource Strain: Low Risk    Difficulty of Paying Living Expenses: Not very hard  Food Insecurity: No Food Insecurity   Worried About Charity fundraiser in the Last Year: Never true   Ran Out of Food in the Last Year: Never true  Transportation Needs: No Transportation Needs   Lack of Transportation (Medical): No   Lack of Transportation (Non-Medical): No  Physical Activity: Inactive   Days of Exercise per Week: 0 days   Minutes of Exercise per Session: 0 min  Stress: Stress Concern Present   Feeling of Stress : Rather much  Social Connections: Socially Isolated   Frequency of Communication with Friends and Family: Three times a week   Frequency of Social Gatherings with Friends and Family: Three times a week   Attends Religious Services: Never   Active Member of Clubs or Organizations: No   Attends Archivist Meetings: Never   Marital Status: Widowed  Human resources officer Violence: Not At Risk   Fear of Current or Ex-Partner: No   Emotionally Abused: No   Physically Abused: No   Sexually Abused: No     Physical Exam   Vitals:   06/06/21 0630 06/06/21 0634  BP:  (!) 162/94  Pulse: 97 93  Resp: 18 20  Temp:    SpO2: 98% 92%    CONSTITUTIONAL: Chronically ill-appearing, NAD NEURO: Awake, oriented to name only, moves all extremities equally, intermittently follows commands, left facial droop EYES:  eyes equal and reactive ENT/NECK:  no LAD, no JVD CARDIO: Regular rate, well-perfused, normal S1 and S2 PULM:  CTAB no wheezing or rhonchi GI/GU:  normal bowel sounds, non-distended, non-tender MSK/SPINE:  No gross deformities, no edema SKIN:  no rash, atraumatic PSYCH: Unable to assess  *Additional and/or pertinent findings included in MDM below  Diagnostic and Interventional Summary    EKG Interpretation  Date/Time:  Monday June 06 2021 05:49:10  EST Ventricular Rate:  106 PR Interval:  137 QRS Duration: 90 QT Interval:  332 QTC Calculation: 441 R Axis:   59 Text Interpretation: Sinus tachycardia Atrial premature complex Confirmed by Gerlene Fee [id number]) on 06/06/2021 6:04:30 AM       Labs Reviewed  CBC - Abnormal; Notable for the following components:      Result Value  RDW 15.7 (*)    All other components within normal limits  COMPREHENSIVE METABOLIC PANEL - Abnormal; Notable for the following components:   Sodium 132 (*)    Potassium 3.0 (*)    Chloride 91 (*)    Glucose, Bld 133 (*)    BUN 29 (*)    Creatinine, Ser 1.39 (*)    GFR, Estimated 40 (*)    All other components within normal limits  CBG MONITORING, ED - Abnormal; Notable for the following components:   Glucose-Capillary 131 (*)    All other components within normal limits  RESP PANEL BY RT-PCR (FLU A&B, COVID) ARPGX2  DIFFERENTIAL  ETHANOL  PROTIME-INR  APTT  RAPID URINE DRUG SCREEN, HOSP PERFORMED  URINALYSIS, ROUTINE W REFLEX MICROSCOPIC  ACETAMINOPHEN LEVEL  SALICYLATE LEVEL  AMMONIA  LACTIC ACID, PLASMA  I-STAT CHEM 8, ED    CT Head Wo Contrast  Final Result    CT Cervical Spine Wo Contrast  Final Result      Medications  sodium chloride 0.9 % bolus 1,000 mL (1,000 mLs Intravenous New Bag/Given 06/06/21 9509)     Procedures  /  Critical Care .Critical Care Performed by: Maudie Flakes, MD Authorized by: Maudie Flakes, MD   Critical care provider statement:    Critical care time (minutes):  45   Critical care was necessary to treat or prevent imminent or life-threatening deterioration of the following conditions:  CNS failure or compromise   Critical care was time spent personally by me on the following activities:  Development of treatment plan with patient or surrogate, discussions with consultants, evaluation of patient's response to treatment, examination of patient, ordering and review of laboratory studies, ordering and  review of radiographic studies, ordering and performing treatments and interventions, pulse oximetry, re-evaluation of patient's condition and review of old charts  ED Course and Medical Decision Making  I have reviewed the triage vital signs, the nursing notes, and pertinent available records from the EMR.  Listed above are laboratory and imaging tests that I personally ordered, reviewed, and interpreted and then considered in my medical decision making (see below for details).  Altered mental status, facial droop, unknown last known normal.  Report of seizure activity, going in and out of consciousness.  Blood glucose 145.  Patient on arrival is protecting airway, awake and alert but altered, unable to consistently answer questions, moving all extremities equally, does seem to have a persistent subtle left facial droop.  Son called in to inform us that he is concerned that drugs are involved and that patient may have intentionally overdosed in an attempt to kill herself.  EKG is without concerning interval changes, mild tachycardia but otherwise reassuring vitals at this time.  Awaiting labs, CT head.     CT head without acute process, awaiting labs.  Anticipating admission, signed out to oncoming provider at shift change.  Barth Kirks. [place] Small, [place] [email or web address]  Final Clinical Impressions(s) / ED Diagnoses     ICD-10-CM   1. Seizure ([place])  R56.9     2. Altered mental status, unspecified altered mental status type  R41.82       ED Discharge Orders     None        Discharge Instructions Discussed with and Provided to Patient:   Discharge Instructions   None       Maudie Flakes, MD 06/06/21 [phone]

## 2021-06-07 DIAGNOSIS — F10931 Alcohol use, unspecified with withdrawal delirium: Secondary | ICD-10-CM | POA: Diagnosis not present

## 2021-06-07 LAB — BASIC METABOLIC PANEL
Anion gap: 11 (ref 5–15)
Anion gap: 9 (ref 5–15)
BUN: 11 mg/dL (ref 8–23)
BUN: 6 mg/dL — ABNORMAL LOW (ref 8–23)
CO2: 24 mmol/L (ref 22–32)
CO2: 25 mmol/L (ref 22–32)
Calcium: 8.5 mg/dL — ABNORMAL LOW (ref 8.9–10.3)
Calcium: 8.6 mg/dL — ABNORMAL LOW (ref 8.9–10.3)
Chloride: 100 mmol/L (ref 98–111)
Chloride: 101 mmol/L (ref 98–111)
Creatinine, Ser: 0.78 mg/dL (ref 0.44–1.00)
Creatinine, Ser: 0.77 mg/dL (ref 0.44–1.00)
GFR, Estimated: >60 mL/min (ref >60)
GFR, Estimated: >60 mL/min (ref >60)
Glucose, Bld: 141 mg/dL — ABNORMAL HIGH (ref 70–99)
Glucose, Bld: 155 mg/dL — ABNORMAL HIGH (ref 70–99)
Potassium: 2.9 mmol/L — ABNORMAL LOW (ref 3.5–5.1)
Potassium: 3.5 mmol/L (ref 3.5–5.1)
Sodium: 135 mmol/L (ref 135–145)
Sodium: 135 mmol/L (ref 135–145)

## 2021-06-07 LAB — CBC
HCT: 33.5 % — ABNORMAL LOW (ref 36.0–46.0)
Hemoglobin: 10.4 g/dL — ABNORMAL LOW (ref 12.0–15.0)
MCH: 27.7 pg (ref 26.0–34.0)
MCHC: 31 g/dL (ref 30.0–36.0)
MCV: 89.3 fL (ref 80.0–100.0)
Platelets: 224 10*3/uL (ref 150–400)
RBC: 3.75 MIL/uL — ABNORMAL LOW (ref 3.87–5.11)
RDW: 15.6 % — ABNORMAL HIGH (ref 11.5–15.5)
WBC: 6.9 10*3/uL (ref 4.0–10.5)
nRBC: 0 % (ref 0.0–0.2)

## 2021-06-07 LAB — GLUCOSE, CAPILLARY
Glucose-Capillary: 134 mg/dL — ABNORMAL HIGH (ref 70–99)
Glucose-Capillary: 134 mg/dL — ABNORMAL HIGH (ref 70–99)

## 2021-06-07 LAB — MAGNESIUM: Magnesium: 1.3 mg/dL — ABNORMAL LOW (ref 1.7–2.4)

## 2021-06-07 MED ORDER — POTASSIUM CHLORIDE 10 MEQ/100ML IV SOLN
10.0000 meq | INTRAVENOUS | Status: AC
  Filled 2021-06-07: qty 100

## 2021-06-07 MED ORDER — TRAMADOL HCL 50 MG PO TABS
50.0000 mg | ORAL_TABLET | Freq: Once | ORAL | Status: AC
  Administered 2021-06-08: 50 mg via ORAL
  Filled 2021-06-07: qty 1

## 2021-06-07 MED ORDER — POTASSIUM CHLORIDE CRYS ER 20 MEQ PO TBCR
40.0000 meq | EXTENDED_RELEASE_TABLET | Freq: Once | ORAL | Status: AC
  Administered 2021-06-07: 40 meq via ORAL

## 2021-06-07 MED ORDER — MAGNESIUM SULFATE 4 GM/100ML IV SOLN
4.0000 g | Freq: Once | INTRAVENOUS | Status: AC
  Administered 2021-06-08: 4 g via INTRAVENOUS
  Filled 2021-06-07: qty 100

## 2021-06-07 MED ORDER — POTASSIUM CHLORIDE CRYS ER 20 MEQ PO TBCR
40.0000 meq | EXTENDED_RELEASE_TABLET | ORAL | Status: AC
  Administered 2021-06-07 – 2021-06-08 (×2): 40 meq via ORAL
  Filled 2021-06-07 (×2): qty 2

## 2021-06-07 NOTE — Progress Notes (Signed)
PROGRESS NOTE     Stacy Dalton, is a 70 y.o. female, DOB - 04-19-1951, YCX:[id number]  Admit date - 06/06/2021   Admitting Physician Day Greb Denton Brick, MD  Outpatient Primary MD for the patient is [name], FNP  LOS - 1  Chief Complaint  Patient presents with   Seizures        Brief Narrative:  70 y.o. female with significant for severe alcohol dependence, COPD, hypertension, hypothyroidism, chronic pain, depression, and anxiety presented to the ER by EMS after recurrent falls at home, EMS was concerned about possible seizures with confusion possible LOS -Patient apparently quit drinking alcohol on 05/30/2021-- -Admitted on 06/06/2021 with acute alcohol withdrawal syndrome  Assessment & Plan:   Principal Problem:   DTs (delirium tremens) ([place]) Active Problems:   COPD (chronic obstructive pulmonary disease) ([place])   Hypothyroidism   Alcohol use disorder, severe, dependence ([place])   AKI (acute kidney injury) ([place])   1)DTs- --.,  CT head, CT C-spine and MRI brain without acute finding -Despite multiple doses of IV lorazepam patient remains confused and agitated and required transition to IV Precedex drip -Overall doing much better on negative pressure -Continue folic acid thiamine and multivitamin -Ammonia is 37 BAL < 10, UDS negative   2) hypokalemia--- magnesium WNL -Replace and recheck   3)UTI--UA suspicious for UTI IV Rocephin pending urine culture   4) hypothyroidism--- continue levothyroxine   5)HTN--continue amlodipine   may use IV labetalol when necessary  Every 4 hours for systolic blood pressure over 160 mmhg   6) COPD/tobacco abuse----no acute exacerbation at this time, bronchodilators as ordered   7)AKI----acute kidney injury -uspect dehydration related due to poor oral intake in the setting of alcohol withdrawal and confusion -creatinine on admission was 1.39 - baseline usually around 0.8  -Creatinine normalized with hydration renally adjust  medications, avoid nephrotoxic agents / dehydration  / hypotension        Disposition/Need for in-Hospital Stay- patient unable to be discharged at this time due to delirium tremens requiring IV Precedex drip  -Anticipate discharge over the next couple days if DT  symptoms continue to improve  Status is: Inpatient   Remains inpatient appropriate because: Disposition above   Dispo: The patient is from: Home              Anticipated d/c is to: Home              Anticipated d/c date is: 2 days              Patient currently is not medically stable to d/c. Barriers: Not Clinically Stable-     Code Status :  -  Code Status: Full Code   Family Communication:    Discussed with Neighbor at bedside Consults  :  na  DVT Prophylaxis  :   - SCDs  heparin injection 5,000 Units Start: 06/06/21 2200 SCDs Start: 06/06/21 1923 Place TED hose Start: 06/06/21 1923    Lab Results  Component Value Date   PLT 224 06/07/2021    Inpatient Medications  Scheduled Meds:  amLODipine  5 mg Oral Daily   Chlorhexidine Gluconate Cloth  6 each Topical Q0600   diazepam  2 mg Oral TID   ferrous sulfate  325 mg Oral Q breakfast   folic acid  1 mg Oral Daily   heparin  5,000 Units Subcutaneous Q8H   levothyroxine  50 mcg Oral QAC breakfast   magnesium oxide  400 mg Oral Daily  mometasone-formoterol  2 puff Inhalation BID   multivitamin with minerals  1 tablet Oral Daily   sodium chloride flush  3 mL Intravenous Q12H   sodium chloride flush  3 mL Intravenous Q12H   thiamine  100 mg Oral Daily   Or   thiamine  100 mg Intravenous Daily   vitamin B-12  1,000 mcg Oral Daily   Vitamin D (Ergocalciferol)  50,000 Units Oral Q7 days   Continuous Infusions:  sodium chloride     cefTRIAXone (ROCEPHIN)  IV Stopped (06/06/21 2155)   dexmedetomidine (PRECEDEX) IV infusion 0.6 mcg/kg/hr (06/07/21 1055)   dextrose 5 % and 0.45% NaCl 125 mL/hr at 06/07/21 1525   PRN Meds:.sodium chloride, acetaminophen  **OR** acetaminophen, bisacodyl, ondansetron **OR** ondansetron (ZOFRAN) IV, polyethylene glycol, sodium chloride flush, traZODone   Anti-infectives (From admission, onward)    Start     Dose/Rate Route Frequency Ordered Stop   06/06/21 2100  cefTRIAXone (ROCEPHIN) 1 g in sodium chloride 0.9 % 100 mL IVPB        1 g 200 mL/hr over 30 Minutes Intravenous Every 24 hours 06/06/21 1929           Subjective: Stacy Dalton today has no fevers, no emesis,  No chest pain,    -- a lot more cooperative on IV Precedex drip   Objective: Vitals:   06/07/21 1137 06/07/21 1647 06/07/21 1955 06/07/21 2004  BP:      Pulse: 60   80  Resp: 15   20  Temp: 98.8 F (37.1 C) 97.9 F (36.6 C) 98.7 F (37.1 C)   TempSrc: Axillary Oral Oral   SpO2: 100%   98%  Weight:      Height:        Intake/Output Summary (Last 24 hours) at 06/07/2021 2008 Last data filed at 06/07/2021 1955 Gross per 24 hour  Intake 2767.21 ml  Output 450 ml  Net 2317.21 ml   Filed Weights   06/06/21 0555 06/06/21 1120 06/07/21 0600  Weight: 50.8 kg 56.5 kg 52.5 kg     Physical Exam  Gen:-Somewhat sleepy, in no apparent distress  HEENT:- [place].AT, No sclera icterus Neck-Supple Neck,No JVD,.  Lungs-  CTAB , fair symmetrical air movement CV- S1, S2 normal, regular  Abd-  +ve B.Sounds, Abd Soft, No tenderness,    Extremity/Skin:- No  edema, pedal pulses present  Psych-less confused, more cooperative  neuro-generalized weakness, no new focal deficits, +ve  tremors  Data Reviewed: I have personally reviewed following labs and imaging studies  CBC: Recent Labs  Lab 06/06/21 0601 06/07/21 0430  WBC 7.6 6.9  NEUTROABS 5.0  --   HGB 12.2 10.4*  HCT 37.8 33.5*  MCV 88.9 89.3  PLT 299 834   Basic Metabolic Panel: Recent Labs  Lab 06/06/21 0601 06/07/21 0430  NA 132* 135  K 3.0* 2.9*  CL 91* 100  CO2 28 24  GLUCOSE 133* 141*  BUN 29* 11  CREATININE 1.39* 0.78  CALCIUM 9.6 8.5*  MG 1.9  --     GFR: Estimated Creatinine Clearance: 48 mL/min (by C-G formula based on SCr of 0.78 mg/dL). Liver Function Tests: Recent Labs  Lab 06/06/21 0601  AST 34  ALT 17  ALKPHOS 76  BILITOT 0.4  PROT 7.8  ALBUMIN 4.5   No results for input(s): LIPASE, AMYLASE in the last 168 hours. Recent Labs  Lab 06/06/21 0659  AMMONIA 37*   Coagulation Profile: Recent Labs  Lab 06/06/21 0601  INR  0.9   Cardiac Enzymes: No results for input(s): CKTOTAL, CKMB, CKMBINDEX, TROPONINI in the last 168 hours. BNP (last 3 results) No results for input(s): PROBNP in the last 8760 hours. HbA1C: No results for input(s): HGBA1C in the last 72 hours. CBG: Recent Labs  Lab 06/06/21 0551 06/07/21 0917 06/07/21 1504  GLUCAP 131* 134* 134*   Lipid Profile: No results for input(s): CHOL, HDL, LDLCALC, TRIG, CHOLHDL, LDLDIRECT in the last 72 hours. Thyroid Function Tests: No results for input(s): TSH, T4TOTAL, FREET4, T3FREE, THYROIDAB in the last 72 hours. Anemia Panel: No results for input(s): VITAMINB12, FOLATE, FERRITIN, TIBC, IRON, RETICCTPCT in the last 72 hours. Urine analysis:    Component Value Date/Time   COLORURINE YELLOW 06/06/2021 0601   APPEARANCEUR CLEAR 06/06/2021 0601   APPEARANCEUR Cloudy (A) 02/23/2021 1608   LABSPEC 1.005 06/06/2021 0601   PHURINE 7.0 06/06/2021 0601   GLUCOSEU NEGATIVE 06/06/2021 0601   HGBUR SMALL (A) 06/06/2021 0601   BILIRUBINUR NEGATIVE 06/06/2021 0601   BILIRUBINUR Negative 02/23/2021 1608   KETONESUR NEGATIVE 06/06/2021 0601   PROTEINUR NEGATIVE 06/06/2021 0601   UROBILINOGEN negative 07/01/2015 1541   UROBILINOGEN 0.2 10/07/2012 1928   NITRITE NEGATIVE 06/06/2021 0601   LEUKOCYTESUR MODERATE (A) 06/06/2021 0601   Sepsis Labs: @LABRCNTIP (procalcitonin:4,lacticidven:4)  ) Recent Results (from the past 240 hour(s))  Resp Panel by RT-PCR (Flu A&B, Covid) Nasopharyngeal Swab     Status: None   Collection Time: 06/06/21  8:16 AM   Specimen:  Nasopharyngeal Swab; Nasopharyngeal(NP) swabs in vial transport medium  Result Value Ref Range Status   SARS Coronavirus 2 by RT PCR NEGATIVE NEGATIVE Final    Comment: (NOTE) SARS-CoV-2 target nucleic acids are NOT DETECTED.  The SARS-CoV-2 RNA is generally detectable in upper respiratory specimens during the acute phase of infection. The lowest concentration of SARS-CoV-2 viral copies this assay can detect is 138 copies/mL. A negative result does not preclude SARS-Cov-2 infection and should not be used as the sole basis for treatment or other patient management decisions. A negative result may occur with  improper specimen collection/handling, submission of specimen other than nasopharyngeal swab, presence of viral mutation(s) within the areas targeted by this assay, and inadequate number of viral copies(<138 copies/mL). A negative result must be combined with clinical observations, patient history, and epidemiological information. The expected result is Negative.  Fact Sheet for Patients:  EntrepreneurPulse.com.au  Fact Sheet for Healthcare Providers:  IncredibleEmployment.be  This test is no t yet approved or cleared by the [place] FDA and  has been authorized for detection and/or diagnosis of SARS-CoV-2 by FDA under an Emergency Use Authorization (EUA). This EUA will remain  in effect (meaning this test can be used) for the duration of the COVID-19 declaration under Section 564(b)(1) of the Act, 21 U.S.C.section 360bbb-3(b)(1), unless the authorization is terminated  or revoked sooner.       Influenza A by PCR NEGATIVE NEGATIVE Final   Influenza B by PCR NEGATIVE NEGATIVE Final    Comment: (NOTE) The Xpert Xpress SARS-CoV-2/FLU/RSV plus assay is intended as an aid in the diagnosis of influenza from Nasopharyngeal swab specimens and should not be used as a sole basis for treatment. Nasal washings and aspirates are unacceptable for  Xpert Xpress SARS-CoV-2/FLU/RSV testing.  Fact Sheet for Patients: EntrepreneurPulse.com.au  Fact Sheet for Healthcare Providers: IncredibleEmployment.be  This test is not yet approved or cleared by the [place] FDA and has been authorized for detection and/or diagnosis of SARS-CoV-2 by FDA under an Emergency  Use Authorization (EUA). This EUA will remain in effect (meaning this test can be used) for the duration of the COVID-19 declaration under Section 564(b)(1) of the Act, 21 U.S.C. section 360bbb-3(b)(1), unless the authorization is terminated or revoked.  Performed at [facility], [address], [place], [place] [address]   MRSA Next Gen by PCR, Nasal     Status: None   Collection Time: 06/06/21 11:20 AM   Specimen: Nasal Mucosa; Nasal Swab  Result Value Ref Range Status   MRSA by PCR Next Gen NOT DETECTED NOT DETECTED Final    Comment: (NOTE) The GeneXpert MRSA Assay (FDA approved for NASAL specimens only), is one component of a comprehensive MRSA colonization surveillance program. It is not intended to diagnose MRSA infection nor to guide or monitor treatment for MRSA infections. Test performance is not FDA approved in patients less than 62 years old. Performed at [facility], [address], [place], [place] [address]       Radiology Studies: CT Head Wo Contrast  Result Date: 06/06/2021 CLINICAL DATA:  Head trauma, minor (Age >= 65y); Neck trauma (Age >= 65y) EXAM: CT HEAD WITHOUT CONTRAST CT CERVICAL SPINE WITHOUT CONTRAST TECHNIQUE: Multidetector CT imaging of the head and cervical spine was performed following the standard protocol without intravenous contrast. Multiplanar CT image reconstructions of the cervical spine were also generated. COMPARISON:  April 14, 2021. FINDINGS: CT HEAD FINDINGS Brain: No evidence of acute large vascular territory infarction, hemorrhage, hydrocephalus, extra-axial collection or mass lesion/mass  effect. Similar atrophy and chronic microvascular ischemic disease. Vascular: No hyperdense vessel identified. Calcific intracranial atherosclerosis Skull: No acute fracture. Sinuses/Orbits: No acute finding. Other: No mastoid effusions. CT CERVICAL SPINE FINDINGS Similar alignment. Similar reversal of normal cervical lordosis. Similar mild retrolisthesis of C5 on C6, favor degenerative etiology given marked degenerative change at this level. Rotation of C1 on C2. Mild rotatory levocurvature. Skull base and vertebrae: No evidence of acute fracture. Vertebral body heights are maintained. Soft tissues and spinal canal: No prevertebral fluid or swelling. No visible canal hematoma. Disc levels: Similar degenerative change, greatest at C5-C6 where there is disc height loss, endplate sclerosis, and posterior disc/osteophyte complex. Upper chest: Visualized lung apices are clear on motion limited evaluation. IMPRESSION: CT head: No evidence of acute intracranial abnormality. CT cervical spine : 1. No evidence of acute fracture. 2. Rotation of C1 on C2, likely positional in the absence of a fixed torticollis. 3. Similar C5-C6 degenerative change. Electronically Signed   By: Margaretha Sheffield M.D.   On: 06/06/2021 06:24   CT Cervical Spine Wo Contrast  Result Date: 06/06/2021 CLINICAL DATA:  Head trauma, minor (Age >= 65y); Neck trauma (Age >= 65y) EXAM: CT HEAD WITHOUT CONTRAST CT CERVICAL SPINE WITHOUT CONTRAST TECHNIQUE: Multidetector CT imaging of the head and cervical spine was performed following the standard protocol without intravenous contrast. Multiplanar CT image reconstructions of the cervical spine were also generated. COMPARISON:  April 14, 2021. FINDINGS: CT HEAD FINDINGS Brain: No evidence of acute large vascular territory infarction, hemorrhage, hydrocephalus, extra-axial collection or mass lesion/mass effect. Similar atrophy and chronic microvascular ischemic disease. Vascular: No hyperdense vessel  identified. Calcific intracranial atherosclerosis Skull: No acute fracture. Sinuses/Orbits: No acute finding. Other: No mastoid effusions. CT CERVICAL SPINE FINDINGS Similar alignment. Similar reversal of normal cervical lordosis. Similar mild retrolisthesis of C5 on C6, favor degenerative etiology given marked degenerative change at this level. Rotation of C1 on C2. Mild rotatory levocurvature. Skull base and vertebrae: No evidence of acute  fracture. Vertebral body heights are maintained. Soft tissues and spinal canal: No prevertebral fluid or swelling. No visible canal hematoma. Disc levels: Similar degenerative change, greatest at C5-C6 where there is disc height loss, endplate sclerosis, and posterior disc/osteophyte complex. Upper chest: Visualized lung apices are clear on motion limited evaluation. IMPRESSION: CT head: No evidence of acute intracranial abnormality. CT cervical spine : 1. No evidence of acute fracture. 2. Rotation of C1 on C2, likely positional in the absence of a fixed torticollis. 3. Similar C5-C6 degenerative change. Electronically Signed   By: Margaretha Sheffield M.D.   On: 06/06/2021 06:24   MR BRAIN WO CONTRAST  Result Date: 06/06/2021 CLINICAL DATA:  Altered mental status, combative EXAM: MRI HEAD WITHOUT CONTRAST TECHNIQUE: Multiplanar, multiecho pulse sequences of the brain and surrounding structures were obtained without intravenous contrast. COMPARISON:  CT head obtained earlier the same day, MR head 11/24/2020 FINDINGS: The patient was combative and altered, and the exam could not be completed. Sagittal T1, axial T2, and axial and coronal diffusion-weighted images were obtained. Additionally, the sagittal T1 sequence is markedly motion degraded. Brain: There is no diffusion signal abnormality to suggest acute infarct. There is no definite evidence of intracranial hemorrhage or extra-axial fluid collection on the sequences provided. Parenchymal signal is normal on the T2 sequence.  There is no midline shift. Vascular: The flow voids are grossly present but not well evaluated. Skull and upper cervical spine: Marrow is not well evaluated. Sinuses/Orbits: No definite acute finding. Other: None. IMPRESSION: 1. Incomplete and motion degraded study as above. 2. No diffusion-weighted signal abnormality to suggest acute infarct. No definite parenchymal signal abnormality on the axial T2 sequence provided. 3. Consider repeat study when the patient can tolerate completing the exam. Electronically Signed   By: Valetta Mole M.D.   On: 06/06/2021 09:16     Scheduled Meds:  amLODipine  5 mg Oral Daily   Chlorhexidine Gluconate Cloth  6 each Topical Q0600   diazepam  2 mg Oral TID   ferrous sulfate  325 mg Oral Q breakfast   folic acid  1 mg Oral Daily   heparin  5,000 Units Subcutaneous Q8H   levothyroxine  50 mcg Oral QAC breakfast   magnesium oxide  400 mg Oral Daily   mometasone-formoterol  2 puff Inhalation BID   multivitamin with minerals  1 tablet Oral Daily   sodium chloride flush  3 mL Intravenous Q12H   sodium chloride flush  3 mL Intravenous Q12H   thiamine  100 mg Oral Daily   Or   thiamine  100 mg Intravenous Daily   vitamin B-12  1,000 mcg Oral Daily   Vitamin D (Ergocalciferol)  50,000 Units Oral Q7 days   Continuous Infusions:  sodium chloride     cefTRIAXone (ROCEPHIN)  IV Stopped (06/06/21 2155)   dexmedetomidine (PRECEDEX) IV infusion 0.6 mcg/kg/hr (06/07/21 1055)   dextrose 5 % and 0.45% NaCl 125 mL/hr at 06/07/21 1525     LOS: 1 day    Roxan Hockey M.D on 06/07/2021 at 8:08 PM  Go to [email or web address] - for contact info  [facility] - Office  [phone]  If 7PM-7AM, please contact night-coverage [email or web address] Password [email or web address] 06/07/2021, 8:08 PM

## 2021-06-07 NOTE — Consult Note (Signed)
   [facility] Inpatient Consult   06/07/2021  Stacy Dalton 1950/12/18 [id number]  [facility] [employer] [ACO] Patient: Medicare CMSDCE  Primary Care Provider:  [name], FNP, Embedded provider at [place]   Brief review for readmission with extreme high risk score.  Patient is active with Embedded CCM team member   Plan: Notification to be sent to the [facility] LCSW regarding any post hospital needs.  Please contact for further questions,  Natividad Brood, RN BSN [facility] Liaison  [phone] business mobile phone Toll free office [phone]  Fax number: [phone] [email or web address]

## 2021-06-07 NOTE — Progress Notes (Addendum)
Initial Nutrition Assessment  DOCUMENTATION CODES:   Non-severe (moderate) malnutrition in context of chronic illness  INTERVENTION:   When patient is safe to take oral nutrition: Ensure Enlive po BID, each supplement provides 350 kcal and 20 grams of protein    NUTRITION DIAGNOSIS:   Moderate Malnutrition related to chronic illness (alcholism) as evidenced by mild fat depletion, moderate fat depletion, mild muscle depletion, moderate muscle depletion (PMH: chronic ETOH abuse).   GOAL:  Provide needs based on ASPEN/SCCM guidelines  MONITOR:  Diet advancement, Supplement acceptance, Labs, Weight trends, PO intake  REASON FOR ASSESSMENT:   Malnutrition Screening Tool    ASSESSMENT: Patient is a 70 yo female with hx of COPD, HTN, GERD, depression, panic attacks and ETOH abuse. Hypokalemia, AKI on admission. Presents with complaint of seizure.   Hospitalized last month at [facility]- acute alcohol withdrawal. RD assessed 11/18- moderate malnutrition.   Patient not alert enough today at lunch or dinner for po intake. Unable to relate diet recall history. She opened her eyes once during RD visit and asked where she was.  Intake/Output Summary (Last 24 hours) at 06/07/2021 1432 Last data filed at 06/07/2021 1100 Gross per 24 hour  Intake 2767.21 ml  Output 250 ml  Net 2517.21 ml   Weights reviewed. 49-55 kg range since May this year.    Medications: MVI, ferrous sulfate, folic acid, magnesium oxide, B-12, Vit D.    Vitamin A, D, C and zinc checked 05/20/21. All in acceptable range.  Labs: hypokalemia . 11/17- B-12 at 164 pg/ml BMP Latest Ref Rng & Units 06/07/2021 06/06/2021 05/25/2021  Glucose 70 - 99 mg/dL 141(H) 133(H) 96  BUN 8 - 23 mg/dL 11 29(H) 19  Creatinine 0.44 - 1.00 mg/dL 0.78 1.39(H) 0.73  BUN/Creat Ratio 12 - 28 - - -  Sodium 135 - 145 mmol/L 135 132(L) 140  Potassium 3.5 - 5.1 mmol/L 2.9(L) 3.0(L) 3.5  Chloride 98 - 111 mmol/L 100 91(L) 106  CO2 22 - 32 mmol/L 24  28 26   Calcium 8.9 - 10.3 mg/dL 8.5(L) 9.6 9.2      NUTRITION - FOCUSED PHYSICAL EXAM:  Flowsheet Row Most Recent Value  Orbital Region Mild depletion  Upper Arm Region Moderate depletion  Thoracic and Lumbar Region Moderate depletion  Buccal Region Mild depletion  Temple Region Mild depletion  Clavicle Bone Region Moderate depletion  Clavicle and Acromion Bone Region Moderate depletion  Scapular Bone Region Mild depletion  Dorsal Hand Moderate depletion  Patellar Region Moderate depletion  Anterior Thigh Region Severe depletion  Posterior Calf Region Moderate depletion  Edema (RD Assessment) None  Hair Reviewed  Eyes Reviewed  Mouth Reviewed  Skin Reviewed  Nails Reviewed      Diet Order:   Diet Order             Diet Heart Room service appropriate? Yes; Fluid consistency: Thin  Diet effective now                   EDUCATION NEEDS:  Not appropriate for education at this time  Skin:  Skin Assessment: Reviewed RN Assessment  Last BM:  unknown  Height:   Ht Readings from Last 1 Encounters:  06/06/21 (P) 5\' 1"  (1.549 m)    Weight:   Wt Readings from Last 1 Encounters:  06/07/21 52.5 kg    Ideal Body Weight:   48 kg  BMI:  Body mass index is 21.87 kg/m (pended).  Estimated Nutritional Needs:   Kcal:  1500-1700  Protein:  70-74 gr  Fluid:  1600 ml daily   Colman Cater MS,RD,CSG,LDN Contact: Shea Evans

## 2021-06-08 ENCOUNTER — Other Ambulatory Visit: Payer: Self-pay

## 2021-06-08 DIAGNOSIS — F1020 Alcohol dependence, uncomplicated: Secondary | ICD-10-CM | POA: Diagnosis not present

## 2021-06-08 DIAGNOSIS — N179 Acute kidney failure, unspecified: Secondary | ICD-10-CM

## 2021-06-08 DIAGNOSIS — G8929 Other chronic pain: Secondary | ICD-10-CM

## 2021-06-08 DIAGNOSIS — F10931 Alcohol use, unspecified with withdrawal delirium: Secondary | ICD-10-CM | POA: Diagnosis not present

## 2021-06-08 DIAGNOSIS — J449 Chronic obstructive pulmonary disease, unspecified: Secondary | ICD-10-CM | POA: Diagnosis not present

## 2021-06-08 DIAGNOSIS — R338 Other retention of urine: Secondary | ICD-10-CM

## 2021-06-08 DIAGNOSIS — N390 Urinary tract infection, site not specified: Secondary | ICD-10-CM

## 2021-06-08 DIAGNOSIS — E039 Hypothyroidism, unspecified: Secondary | ICD-10-CM

## 2021-06-08 LAB — COMPREHENSIVE METABOLIC PANEL
ALT: 12 U/L (ref 0–44)
AST: 20 U/L (ref 15–41)
Albumin: 3.2 g/dL — ABNORMAL LOW (ref 3.5–5.0)
Alkaline Phosphatase: 60 U/L (ref 38–126)
Anion gap: 7 (ref 5–15)
BUN: 6 mg/dL — ABNORMAL LOW (ref 8–23)
CO2: 23 mmol/L (ref 22–32)
Calcium: 8.7 mg/dL — ABNORMAL LOW (ref 8.9–10.3)
Chloride: 104 mmol/L (ref 98–111)
Creatinine, Ser: 0.71 mg/dL (ref 0.44–1.00)
GFR, Estimated: >60 mL/min (ref >60)
Glucose, Bld: 152 mg/dL — ABNORMAL HIGH (ref 70–99)
Potassium: 3.4 mmol/L — ABNORMAL LOW (ref 3.5–5.1)
Sodium: 134 mmol/L — ABNORMAL LOW (ref 135–145)
Total Bilirubin: 0.2 mg/dL — ABNORMAL LOW (ref 0.3–1.2)
Total Protein: 5.8 g/dL — ABNORMAL LOW (ref 6.5–8.1)

## 2021-06-08 LAB — CBC
HCT: 33.3 % — ABNORMAL LOW (ref 36.0–46.0)
Hemoglobin: 10.5 g/dL — ABNORMAL LOW (ref 12.0–15.0)
MCH: 27.8 pg (ref 26.0–34.0)
MCHC: 31.5 g/dL (ref 30.0–36.0)
MCV: 88.1 fL (ref 80.0–100.0)
Platelets: 203 10*3/uL (ref 150–400)
RBC: 3.78 MIL/uL — ABNORMAL LOW (ref 3.87–5.11)
RDW: 15.4 % (ref 11.5–15.5)
WBC: 7.1 10*3/uL (ref 4.0–10.5)
nRBC: 0 % (ref 0.0–0.2)

## 2021-06-08 LAB — GLUCOSE, CAPILLARY
Glucose-Capillary: 148 mg/dL — ABNORMAL HIGH (ref 70–99)
Glucose-Capillary: 122 mg/dL — ABNORMAL HIGH (ref 70–99)

## 2021-06-08 MED ORDER — TRAMADOL HCL 50 MG PO TABS
50.0000 mg | ORAL_TABLET | Freq: Once | ORAL | Status: AC
  Administered 2021-06-08: 50 mg via ORAL
  Filled 2021-06-08: qty 1

## 2021-06-08 MED ORDER — INFLUENZA VAC A&B SA ADJ QUAD 0.5 ML IM PRSY
0.5000 mL | PREFILLED_SYRINGE | INTRAMUSCULAR | Status: DC
  Filled 2021-06-08: qty 0.5

## 2021-06-08 MED ORDER — HYDROMORPHONE HCL 1 MG/ML IJ SOLN
1.0000 mg | Freq: Once | INTRAMUSCULAR | Status: AC
  Administered 2021-06-08: 1 mg via INTRAVENOUS
  Filled 2021-06-08: qty 1

## 2021-06-08 MED ORDER — TRAMADOL HCL 50 MG PO TABS
50.0000 mg | ORAL_TABLET | Freq: Three times a day (TID) | ORAL | Status: DC | PRN
  Administered 2021-06-08: 50 mg via ORAL
  Filled 2021-06-08: qty 1

## 2021-06-08 MED ORDER — TAMSULOSIN HCL 0.4 MG PO CAPS
0.4000 mg | ORAL_CAPSULE | Freq: Every day | ORAL | Status: DC
  Administered 2021-06-08 – 2021-06-13 (×6): 0.4 mg via ORAL
  Filled 2021-06-08 (×6): qty 1

## 2021-06-08 MED ORDER — TRAMADOL HCL 50 MG PO TABS
100.0000 mg | ORAL_TABLET | Freq: Three times a day (TID) | ORAL | Status: DC | PRN
  Administered 2021-06-08 – 2021-06-10 (×6): 100 mg via ORAL
  Filled 2021-06-08 (×6): qty 2

## 2021-06-08 MED ORDER — POTASSIUM CHLORIDE 10 MEQ/100ML IV SOLN
10.0000 meq | INTRAVENOUS | Status: AC
  Administered 2021-06-08 (×4): 10 meq via INTRAVENOUS
  Filled 2021-06-08 (×4): qty 100

## 2021-06-08 NOTE — Progress Notes (Addendum)
Bladder scanned pt, >999. Pt bladder distended and abdomen tender.  Foley kit used to perform in and out as unit was out of stock of intermittent catheter trays. Urine returned initially yellow/clear and then turned to cloudy with sediment. Pt had urinated 1350 with purewik in place. With catheter inserted returned 1425 of urine. Dr. Dyann Kief to bedside to evaluate. Verbal order received to leave foley in place for 24 hours for acute urinary retention. Pt felt relief after bladder drained and abdomen is now soft and non-tender upon assessment.

## 2021-06-08 NOTE — Plan of Care (Signed)
  Problem: Acute Rehab PT Goals(only PT should resolve) Goal: Pt Will Go Supine/Side To Sit Outcome: Progressing Flowsheets (Taken 06/08/2021 1603) Pt will go Supine/Side to Sit:  Independently  with modified independence Goal: Patient Will Transfer Sit To/From Stand Outcome: Progressing Flowsheets (Taken 06/08/2021 1603) Patient will transfer sit to/from stand: with modified independence Goal: Pt Will Transfer Bed To Chair/Chair To Bed Outcome: Progressing Flowsheets (Taken 06/08/2021 1603) Pt will Transfer Bed to Chair/Chair to Bed: with modified independence Goal: Pt Will Ambulate Outcome: Progressing Flowsheets (Taken 06/08/2021 1603) Pt will Ambulate:  100 feet  with modified independence  with least restrictive assistive device  4:03 PM, 06/08/21 Lonell Grandchild, MPT Physical Therapist with [facility] [phone] office [phone] mobile phone

## 2021-06-08 NOTE — Progress Notes (Signed)
PROGRESS NOTE     Stacy Dalton, is a 70 y.o. female, DOB - 07/04/1950, KGM:[id number]  Admit date - 06/06/2021   Admitting Physician Courage Denton Brick, MD  Outpatient Primary MD for the patient is Stacy Balloon, FNP  LOS - 2  Chief Complaint  Patient presents with   Seizures        Brief Narrative:  70 y.o. female with significant for severe alcohol dependence, COPD, hypertension, hypothyroidism, chronic pain, depression, and anxiety presented to the ER by EMS after recurrent falls at home, EMS was concerned about possible seizures with confusion possible LOS -Patient apparently quit drinking alcohol on 05/30/2021-- -Admitted on 06/06/2021 with acute alcohol withdrawal syndrome  Assessment & Plan:   Principal Problem:   DTs (delirium tremens) ([place]) Active Problems:   COPD (chronic obstructive pulmonary disease) ([place])   Hypothyroidism   Alcohol use disorder, severe, dependence ([place])   AKI (acute kidney injury) ([place])   1)DTs- -CT head, CT C-spine and MRI brain without acute finding -Overall doing significantly better; off Precedex currently. -Patient will be transfer to telemetry bed and will continue the use of lorazepam as per CIWA protocol. -Cessation counseling provided.   -Continue thiamine and folic acid.  Despite multiple doses of IV lorazepam patient remains confused and agitated and required transition to IV Precedex drip   2) hypokalemia--- magnesium WNL -Continue to follow electrolytes trend and further replete as needed.   3) acute urinary retention/UTI -UA suspicious for UTI -Follow culture results -Continue empirically treatment with IV Rocephin.  -Foley catheter has been placed in the setting of acute retention; patient reported intermittent history of rotation in her adult life; will assess voiding trial in a.m. -will add flomax.   4) hypothyroidism -continue levothyroxine   5)HTN -Overall stable and fairly well controlled -Continue  amlodipine.   6) COPD/tobacco abuse -Patient reports no shortness of breath and is currently no wheezing -Cessation counseling provided -Continue as needed bronchodilators.   7)AKI -In the setting of dehydration, UTI and urinary retention -Foley catheter in place -IV fluids has been provided -Placement and adequate hydration -Renal function has improved and essentially back to baseline -Continue to follow trend -Continue avoiding nephrotoxic agents.   8) chronic pain syndrome -resume home dose of As needed tramadol.     Disposition/Need for in-Hospital Stay- patient unable to be discharged at this time due to UTI/urinary retention and completing DTs process.  Status is: Inpatient   Remains inpatient appropriate because: Disposition above   Dispo: The patient is from: Home              Anticipated d/c is to: Home              Anticipated d/c date is: 2 days              Patient currently is not medically stable to d/c. Will transfer to telemetry bed.  Physical therapy evaluation requested.  Barriers: Not Clinically Stable-     Code Status :  -  Code Status: Full Code   Family Communication:    Discussed with Neighbor at bedside Consults  :  na  DVT Prophylaxis  :   - SCDs  heparin injection 5,000 Units Start: 06/06/21 2200 SCDs Start: 06/06/21 1923 Place TED hose Start: 06/06/21 1923    Lab Results  Component Value Date   PLT 203 06/08/2021    Inpatient Medications  Scheduled Meds:  amLODipine  5 mg Oral Daily   Chlorhexidine Gluconate Cloth  6  each Topical Q0600   ferrous sulfate  325 mg Oral Q breakfast   folic acid  1 mg Oral Daily   heparin  5,000 Units Subcutaneous Q8H    HYDROmorphone (DILAUDID) injection  1 mg Intravenous Once   [START ON 06/09/2021] influenza vaccine adjuvanted  0.5 mL Intramuscular Tomorrow-1000   levothyroxine  50 mcg Oral QAC breakfast   magnesium oxide  400 mg Oral Daily   mometasone-formoterol  2 puff Inhalation BID    multivitamin with minerals  1 tablet Oral Daily   sodium chloride flush  3 mL Intravenous Q12H   sodium chloride flush  3 mL Intravenous Q12H   thiamine  100 mg Oral Daily   Or   thiamine  100 mg Intravenous Daily   vitamin B-12  1,000 mcg Oral Daily   Vitamin D (Ergocalciferol)  50,000 Units Oral Q7 days   Continuous Infusions:  sodium chloride     cefTRIAXone (ROCEPHIN)  IV Stopped (06/07/21 2217)   dexmedetomidine (PRECEDEX) IV infusion Stopped (06/08/21 0907)   dextrose 5 % and 0.45% NaCl 125 mL/hr at 06/08/21 1057   PRN Meds:.sodium chloride, acetaminophen **OR** acetaminophen, bisacodyl, ondansetron **OR** ondansetron (ZOFRAN) IV, polyethylene glycol, sodium chloride flush, traMADol, traZODone   Anti-infectives (From admission, onward)    Start     Dose/Rate Route Frequency Ordered Stop   06/06/21 2100  cefTRIAXone (ROCEPHIN) 1 g in sodium chloride 0.9 % 100 mL IVPB        1 g 200 mL/hr over 30 Minutes Intravenous Every 24 hours 06/06/21 1929           Subjective: Stacy Dalton no chest pain, no nausea, no vomiting.  Complaining of significant abdominal discomfort or back pain.  Patient found with acute urinary retention requiring Foley catheter placement.  Off Precedex drip with improvement in her mentation.  No significant withdrawal symptoms (mild tremors appreciated).  Objective: Vitals:   06/08/21 0800 06/08/21 0900 06/08/21 1000 06/08/21 1111  BP: (!) 178/68 (!) 179/75 (!) 181/111   Pulse: 74 77 100 93  Resp: 13 17 17    Temp:    98.8 F (37.1 C)  TempSrc:    Oral  SpO2: 91% 99% 100% 100%  Weight:      Height:        Intake/Output Summary (Last 24 hours) at 06/08/2021 1151 Last data filed at 06/08/2021 1054 Gross per 24 hour  Intake 3366.16 ml  Output 1000 ml  Net 2366.16 ml   Filed Weights   06/06/21 0555 06/06/21 1120 06/07/21 0600  Weight: 50.8 kg 56.5 kg 52.5 kg     Physical Exam General exam: Alert, awake, oriented x 3; complaining of back  pain and abdominal pain in the setting of acute urinary retention.  Off Precedex drip at this time. Respiratory system: Clear to auscultation. Respiratory effort normal.  No using accessory muscles. Cardiovascular system:RRR. No murmurs, rubs, gallops.  No JVD Gastrointestinal system: Abdomen is mildly distended, tender to palpation in her suprapubic area, no guarding, positive bowel sounds.  Central nervous system: Alert and oriented. No focal neurological deficits. Extremities: No cyanosis or clubbing; no edema. Skin: No rashes, no petechiae. Psychiatry: Judgement and insight appear normal. Mood & affect appropriate.   Data Reviewed: I have personally reviewed following labs and imaging studies  CBC: Recent Labs  Lab 06/06/21 0601 06/07/21 0430 06/08/21 0415  WBC 7.6 6.9 7.1  NEUTROABS 5.0  --   --   HGB 12.2 10.4* 10.5*  HCT 37.8 33.5*  33.3*  MCV 88.9 89.3 88.1  PLT 299 224 433   Basic Metabolic Panel: Recent Labs  Lab 06/06/21 0601 06/07/21 0430 06/07/21 2211 06/08/21 0415  NA 132* 135 135 134*  K 3.0* 2.9* 3.5 3.4*  CL 91* 100 101 104  CO2 28 24 25 23   GLUCOSE 133* 141* 155* 152*  BUN 29* 11 6* 6*  CREATININE 1.39* 0.78 0.77 0.71  CALCIUM 9.6 8.5* 8.6* 8.7*  MG 1.9  --  1.3*  --    GFR: Estimated Creatinine Clearance: 48 mL/min (by C-G formula based on SCr of 0.71 mg/dL). Liver Function Tests: Recent Labs  Lab 06/06/21 0601 06/08/21 0415  AST 34 20  ALT 17 12  ALKPHOS 76 60  BILITOT 0.4 0.2*  PROT 7.8 5.8*  ALBUMIN 4.5 3.2*   No results for input(s): LIPASE, AMYLASE in the last 168 hours. Recent Labs  Lab 06/06/21 0659  AMMONIA 37*   Coagulation Profile: Recent Labs  Lab 06/06/21 0601  INR 0.9   Cardiac Enzymes: No results for input(s): CKTOTAL, CKMB, CKMBINDEX, TROPONINI in the last 168 hours. BNP (last 3 results) No results for input(s): PROBNP in the last 8760 hours. HbA1C: No results for input(s): HGBA1C in the last 72  hours. CBG: Recent Labs  Lab 06/06/21 0551 06/07/21 0917 06/07/21 1504 06/08/21 0346 06/08/21 1112  GLUCAP 131* 134* 134* 148* 122*   Lipid Profile: No results for input(s): CHOL, HDL, LDLCALC, TRIG, CHOLHDL, LDLDIRECT in the last 72 hours. Thyroid Function Tests: No results for input(s): TSH, T4TOTAL, FREET4, T3FREE, THYROIDAB in the last 72 hours. Anemia Panel: No results for input(s): VITAMINB12, FOLATE, FERRITIN, TIBC, IRON, RETICCTPCT in the last 72 hours. Urine analysis:    Component Value Date/Time   COLORURINE YELLOW 06/06/2021 0601   APPEARANCEUR CLEAR 06/06/2021 0601   APPEARANCEUR Cloudy (A) 02/23/2021 1608   LABSPEC 1.005 06/06/2021 0601   PHURINE 7.0 06/06/2021 0601   GLUCOSEU NEGATIVE 06/06/2021 0601   HGBUR SMALL (A) 06/06/2021 0601   BILIRUBINUR NEGATIVE 06/06/2021 0601   BILIRUBINUR Negative 02/23/2021 1608   KETONESUR NEGATIVE 06/06/2021 0601   PROTEINUR NEGATIVE 06/06/2021 0601   UROBILINOGEN negative 07/01/2015 1541   UROBILINOGEN 0.2 10/07/2012 1928   NITRITE NEGATIVE 06/06/2021 0601   LEUKOCYTESUR MODERATE (A) 06/06/2021 0601   Sepsis Labs: @LABRCNTIP (procalcitonin:4,lacticidven:4)  ) Recent Results (from the past 240 hour(s))  Urine Culture     Status: Abnormal (Preliminary result)   Collection Time: 06/06/21  6:10 AM   Specimen: Urine, Clean Catch  Result Value Ref Range Status   Specimen Description   Final    URINE, CLEAN CATCH Performed at [facility], [address] [place], [place] [address]    Special Requests   Final    NONE Performed at [facility], [address] [place],  [address]    Culture (A)  Final    >=100,000 COLONIES/mL MORGANELLA MORGANII SUSCEPTIBILITIES TO FOLLOW Performed at [facility], [place] [address], [place],  [address]    Report Status PENDING  Incomplete  Resp Panel by RT-PCR (Flu A&B, Covid) Nasopharyngeal Swab     Status: None   Collection Time: 06/06/21  8:16 AM   Specimen:  Nasopharyngeal Swab; Nasopharyngeal(NP) swabs in vial transport medium  Result Value Ref Range Status   SARS Coronavirus 2 by RT PCR NEGATIVE NEGATIVE Final    Comment: (NOTE) SARS-CoV-2 target nucleic acids are NOT DETECTED.  The SARS-CoV-2 RNA is generally detectable in upper respiratory specimens during the  acute phase of infection. The lowest concentration of SARS-CoV-2 viral copies this assay can detect is 138 copies/mL. A negative result does not preclude SARS-Cov-2 infection and should not be used as the sole basis for treatment or other patient management decisions. A negative result may occur with  improper specimen collection/handling, submission of specimen other than nasopharyngeal swab, presence of viral mutation(s) within the areas targeted by this assay, and inadequate number of viral copies(<138 copies/mL). A negative result must be combined with clinical observations, patient history, and epidemiological information. The expected result is Negative.  Fact Sheet for Patients:  EntrepreneurPulse.com.au  Fact Sheet for Healthcare Providers:  IncredibleEmployment.be  This test is no t yet approved or cleared by the [place] FDA and  has been authorized for detection and/or diagnosis of SARS-CoV-2 by FDA under an Emergency Use Authorization (EUA). This EUA will remain  in effect (meaning this test can be used) for the duration of the COVID-19 declaration under Section 564(b)(1) of the Act, 21 U.S.C.section 360bbb-3(b)(1), unless the authorization is terminated  or revoked sooner.       Influenza A by PCR NEGATIVE NEGATIVE Final   Influenza B by PCR NEGATIVE NEGATIVE Final    Comment: (NOTE) The Xpert Xpress SARS-CoV-2/FLU/RSV plus assay is intended as an aid in the diagnosis of influenza from Nasopharyngeal swab specimens and should not be used as a sole basis for treatment. Nasal washings and aspirates are unacceptable for  Xpert Xpress SARS-CoV-2/FLU/RSV testing.  Fact Sheet for Patients: EntrepreneurPulse.com.au  Fact Sheet for Healthcare Providers: IncredibleEmployment.be  This test is not yet approved or cleared by the [place] FDA and has been authorized for detection and/or diagnosis of SARS-CoV-2 by FDA under an Emergency Use Authorization (EUA). This EUA will remain in effect (meaning this test can be used) for the duration of the COVID-19 declaration under Section 564(b)(1) of the Act, 21 U.S.C. section 360bbb-3(b)(1), unless the authorization is terminated or revoked.  Performed at [facility], [address] [place], [place] [address]   MRSA Next Gen by PCR, Nasal     Status: None   Collection Time: 06/06/21 11:20 AM   Specimen: Nasal Mucosa; Nasal Swab  Result Value Ref Range Status   MRSA by PCR Next Gen NOT DETECTED NOT DETECTED Final    Comment: (NOTE) The GeneXpert MRSA Assay (FDA approved for NASAL specimens only), is one component of a comprehensive MRSA colonization surveillance program. It is not intended to diagnose MRSA infection nor to guide or monitor treatment for MRSA infections. Test performance is not FDA approved in patients less than 38 years old. Performed at [facility], [address], [place], [place] [address]       Radiology Studies: No results found.   Scheduled Meds:  amLODipine  5 mg Oral Daily   Chlorhexidine Gluconate Cloth  6 each Topical Q0600   ferrous sulfate  325 mg Oral Q breakfast   folic acid  1 mg Oral Daily   heparin  5,000 Units Subcutaneous Q8H    HYDROmorphone (DILAUDID) injection  1 mg Intravenous Once   [START ON 06/09/2021] influenza vaccine adjuvanted  0.5 mL Intramuscular Tomorrow-1000   levothyroxine  50 mcg Oral QAC breakfast   magnesium oxide  400 mg Oral Daily   mometasone-formoterol  2 puff Inhalation BID   multivitamin with minerals  1 tablet Oral Daily   sodium chloride flush  3 mL  Intravenous Q12H   sodium chloride flush  3 mL Intravenous Q12H  thiamine  100 mg Oral Daily   Or   thiamine  100 mg Intravenous Daily   vitamin B-12  1,000 mcg Oral Daily   Vitamin D (Ergocalciferol)  50,000 Units Oral Q7 days   Continuous Infusions:  sodium chloride     cefTRIAXone (ROCEPHIN)  IV Stopped (06/07/21 2217)   dexmedetomidine (PRECEDEX) IV infusion Stopped (06/08/21 0907)   dextrose 5 % and 0.45% NaCl 125 mL/hr at 06/08/21 1057     LOS: 2 days    Barton Dubois M.D on 06/08/2021 at 11:51 AM  Go to [email or web address] - for contact info  [facility] - Office  [phone]  If 7PM-7AM, please contact night-coverage [email or web address] Password [email or web address] 06/08/2021, 11:51 AM

## 2021-06-08 NOTE — TOC Initial Note (Signed)
Transition of Care [facility]) - Initial/Assessment Note    Patient Details  Name: Stacy Dalton MRN: [id number] Date of Birth: Dec 15, 1950  [facility]) CM/SW Contact:    Salome Arnt, LCSW Phone Number: 06/08/2021, 1:46 PM  Clinical Narrative:  Pt admitted with hypokalemia, UTI, and DTs. TOC consulted for substance abuse. Pt reports she lives alone, but a friend stays with her sometimes and whenever needed. She is independent with ADLs. [facility] with [facility] notified of admission. Per pt, she drinks a "large bottle" of vodka every few days. When asked if she feels alcohol is a problem for you, she states, "I guess you could say that." Discussed resources and pt said none are needed. She has stopped on her own before and plans to do the same again. Pt aware to contact TOC if needed. Will continue to follow.                  Expected Discharge Plan: Home/Self Care Barriers to Discharge: Continued Medical Work up   Patient Goals and CMS Choice Patient states their goals for this hospitalization and ongoing recovery are:: return home   Choice offered to / list presented to : Patient  Expected Discharge Plan and Services Expected Discharge Plan: Home/Self Care In-house Referral: Clinical Social Work   Post Acute Care Choice: Resumption of Svcs/PTA Provider Living arrangements for the past 2 months: Single Family Home                 DME Arranged: N/A         HH Arranged: PT HH Agency: [facility] Date HH Agency Contacted: 06/08/21 Time [place]: 1346 Representative spoke with at [place]: Vaughan Basta  Prior Living Arrangements/Services Living arrangements for the past 2 months: [place] with:: Self Patient language and need for interpreter reviewed:: Yes Do you feel safe going back to the place where you live?: Yes          Current home services: Home PT Criminal Activity/Legal  Involvement Pertinent to Current Situation/Hospitalization: No - Comment as needed  Activities of Daily Living      Permission Sought/Granted                  Emotional Assessment     Affect (typically observed): Appropriate Orientation: : Oriented to Self, Oriented to Place, Oriented to  Time, Oriented to Situation Alcohol / Substance Use: Not Applicable Psych Involvement: No (comment)  Admission diagnosis:  Seizure ([place]) [R56.9] DTs (delirium tremens) ([place]) [F10.931] Altered mental status, unspecified altered mental status type [R41.82] Patient Active Problem List   Diagnosis Date Noted   Seizure ([place]) 06/06/2021   DTs (delirium tremens) ([place]) 06/06/2021   AKI (acute kidney injury) ([place]) 05/19/2021   Elevated LFTs 34/74/2595   Metabolic acidosis 63/87/5643   COVID-19 virus infection 04/15/2021    Class: Acute   Hypotensive episode 04/15/2021    Class: Acute   Physical deconditioning    Vitamin B12 deficiency 02/04/2021   Leukocytosis 11/23/2020   Alcohol use disorder, severe, dependence ([place]) 10/08/2020   Hyponatremia 09/17/2020   Alcohol withdrawal ([place]) 09/12/2020   Chronic bronchitis ([place]) 08/20/2020   Moderate protein malnutrition ([place]) 08/20/2020   Chronic low back pain 08/19/2020   Elevated SGOT ([place]) 08/19/2020   Elevated troponin 08/19/2020   Increased serum lipase level 08/19/2020   Large bowel obstruction ([place])    Small bowel obstruction ([place]) 07/20/2018  Hypomagnesemia 07/20/2018   Cough 03/14/2018   Polypharmacy 02/23/2017   Hypokalemia 78/24/2353   Toxic metabolic encephalopathy 61/44/3154   Anxiety 02/22/2017   GERD (gastroesophageal reflux disease) 02/22/2017   Pain medication agreement signed 10/10/2016   Opioid dependence ([place]) 10/10/2016   Status post colostomy takedown 04/12/2016   Chronic constipation 12/03/2015   Peritonitis with abscess of intestine ([place]) 11/29/2015   COPD exacerbation ([place]) 09/20/2015   Osteopenia 06/10/2014    Vitamin D deficiency 06/10/2014   Hypothyroidism    Depression 11/18/2012   Insomnia 11/18/2012   Chronic pain syndrome 11/18/2012   HLD (hyperlipidemia) 09/01/2012   S/P colostomy ([place]) 04/19/2012   Normocytic anemia 10/15/2011   Hypertension 10/13/2011   Chronic back pain 10/13/2011   COPD (chronic obstructive pulmonary disease) ([place]) 10/09/2011   PCP:  [name], FNP Pharmacy:   [facility] [place], [address] [place] [address] [place] [address] [place] [address] [place] [id number] Phone: [phone] Fax: [phone]     Social Determinants of Health (SDOH) Interventions    Readmission Risk Interventions Readmission Risk Prevention Plan 06/08/2021 04/19/2021 04/15/2021  Transportation Screening Complete - Complete  Medication Review Press photographer) Complete - Complete  PCP or Specialist appointment within 3-5 days of discharge - Complete -  [place] or Home Care Consult Complete - Complete  SW Recovery Care/Counseling Consult Complete - Complete  Palliative Care Screening Not Applicable - Not Applicable  Skilled Nursing Facility Not Applicable - Complete  Some recent data might be hidden

## 2021-06-08 NOTE — Evaluation (Signed)
Physical Therapy Evaluation Patient Details Name: WHITNEE ORZEL MRN: [id number] DOB: 1951-03-19 Today's Date: 06/08/2021  History of Present Illness  Maisey Deandrade  is a 70 y.o. female with significant for severe alcohol dependence, COPD, hypertension, hypothyroidism, chronic pain, depression, and anxiety presented to the ER by EMS after recurrent falls at home, EMS was concerned about possible seizures with confusion possible LOS  -Patient apparently quit drinking alcohol on 05/30/2021--  -She had become increasingly more confused over the last few days   Clinical Impression  Patient functioning near baseline for functional mobility and gait other than having to lean on nearby objects for support when taking steps without AD, safer using RW with good return demonstrated for use in hallway and room without loss of balance.  Patient tolerated sitting up in chair after therapy.  Patient will benefit from continued skilled physical therapy in hospital and recommended venue below to increase strength, balance, endurance for safe ADLs and gait.          Recommendations for follow up therapy are one component of a multi-disciplinary discharge planning process, led by the attending physician.  Recommendations may be updated based on patient status, additional functional criteria and insurance authorization.  Follow Up Recommendations Home health PT    Assistance Recommended at Discharge PRN  Functional Status Assessment Patient has had a recent decline in their functional status and demonstrates the ability to make significant improvements in function in a reasonable and predictable amount of time.  Equipment Recommendations  None recommended by PT    Recommendations for Other Services       Precautions / Restrictions Precautions Precautions: Fall Restrictions Weight Bearing Restrictions: No      Mobility  Bed Mobility Overal bed mobility: Modified Independent                   Transfers Overall transfer level: Needs assistance Equipment used: Rolling walker (2 wheels);None Transfers: Sit to/from Stand;Bed to chair/wheelchair/BSC Sit to Stand: Supervision   Step pivot transfers: Supervision;Min guard       General transfer comment: slightly labored movement    Ambulation/Gait Ambulation/Gait assistance: Supervision;Min guard Gait Distance (Feet): 60 Feet Assistive device: Rolling walker (2 wheels) Gait Pattern/deviations: Decreased step length - left;Decreased stance time - right;Decreased stride length Gait velocity: decreased     General Gait Details: slightly labored cadence with tendency to lean on nearby objects for support, safer using RW with good return demonstrated walking in room and hallway without loss of balance, limited mostly due to fatigue  Stairs            Wheelchair Mobility    Modified Rankin (Stroke Patients Only)       Balance Overall balance assessment: Needs assistance Sitting-balance support: Feet supported;No upper extremity supported Sitting balance-Leahy Scale: Good Sitting balance - Comments: seated at EOB   Standing balance support: During functional activity;No upper extremity supported Standing balance-Leahy Scale: Fair Standing balance comment: fair/poor without AD, fair/good using RW                             Pertinent Vitals/Pain Pain Assessment: No/denies pain    Home Living Family/patient expects to be discharged to:: Private residence Living Arrangements: Alone Available Help at Discharge: Friend(s);Available PRN/intermittently Type of Home: House Home Access: Stairs to enter Entrance Stairs-Rails: None Entrance Stairs-Number of Steps: 1-2   Home Layout: One level Home Equipment: Conservation officer, nature (2 wheels);BSC/3in1;Shower seat  Prior Function Prior Level of Function : Needs assist       Physical Assist : Mobility (physical) Mobility (physical): Bed  mobility;Transfers;Gait;Stairs   Mobility Comments: Hydrographic surveyor, drives rarely ADLs Comments: assisted by family/friends     Hand Dominance   Dominant Hand: Right    Extremity/Trunk Assessment   Upper Extremity Assessment Upper Extremity Assessment: Overall WFL for tasks assessed    Lower Extremity Assessment Lower Extremity Assessment: Generalized weakness    Cervical / Trunk Assessment Cervical / Trunk Assessment: Normal  Communication   Communication: No difficulties  Cognition Arousal/Alertness: Awake/alert Behavior During Therapy: WFL for tasks assessed/performed Overall Cognitive Status: Within Functional Limits for tasks assessed                                          General Comments      Exercises     Assessment/Plan    PT Assessment Patient needs continued PT services  PT Problem List Decreased strength;Decreased activity tolerance;Decreased balance;Decreased mobility       PT Treatment Interventions DME instruction;Gait training;Stair training;Functional mobility training;Therapeutic activities;Therapeutic exercise;Balance training;Patient/family education    PT Goals (Current goals can be found in the Care Plan section)  Acute Rehab PT Goals Patient Stated Goal: return home with family/friends to assist PT Goal Formulation: With patient Time For Goal Achievement: 06/11/21 Potential to Achieve Goals: Good    Frequency Min 3X/week   Barriers to discharge        Co-evaluation               AM-PAC PT "6 Clicks" Mobility  Outcome Measure Help needed turning from your back to your side while in a flat bed without using bedrails?: None Help needed moving from lying on your back to sitting on the side of a flat bed without using bedrails?: None Help needed moving to and from a bed to a chair (including a wheelchair)?: A Little Help needed standing up from a chair using your arms (e.g., wheelchair or bedside chair)?:  A Little Help needed to walk in hospital room?: A Little Help needed climbing 3-5 steps with a railing? : A Little 6 Click Score: 20    End of Session   Activity Tolerance: Patient tolerated treatment well;Patient limited by fatigue Patient left: in chair;with call bell/phone within reach;with chair alarm set Nurse Communication: Mobility status PT Visit Diagnosis: Unsteadiness on feet (R26.81);Other abnormalities of gait and mobility (R26.89);Muscle weakness (generalized) (M62.81)    Time: 6712-4580 PT Time Calculation (min) (ACUTE ONLY): 20 min   Charges:   PT Evaluation $PT Eval Moderate Complexity: 1 Mod PT Treatments $Therapeutic Activity: 8-22 mins        4:02 PM, 06/08/21 Lonell Grandchild, MPT Physical Therapist with [facility] [phone] office [phone] mobile phone

## 2021-06-09 ENCOUNTER — Encounter (HOSPITAL_COMMUNITY): Payer: Self-pay | Admitting: Family Medicine

## 2021-06-09 DIAGNOSIS — J449 Chronic obstructive pulmonary disease, unspecified: Secondary | ICD-10-CM | POA: Diagnosis not present

## 2021-06-09 DIAGNOSIS — N179 Acute kidney failure, unspecified: Secondary | ICD-10-CM | POA: Diagnosis not present

## 2021-06-09 DIAGNOSIS — F10931 Alcohol use, unspecified with withdrawal delirium: Secondary | ICD-10-CM | POA: Diagnosis not present

## 2021-06-09 DIAGNOSIS — F1020 Alcohol dependence, uncomplicated: Secondary | ICD-10-CM | POA: Diagnosis not present

## 2021-06-09 LAB — URINE CULTURE: Culture: >=100000 — AB

## 2021-06-09 MED ORDER — TEMAZEPAM 15 MG PO CAPS
15.0000 mg | ORAL_CAPSULE | Freq: Every evening | ORAL | Status: DC | PRN
  Administered 2021-06-09 – 2021-06-12 (×3): 15 mg via ORAL
  Filled 2021-06-09 (×3): qty 1

## 2021-06-09 MED ORDER — SODIUM CHLORIDE 0.9 % IV SOLN
1.0000 g | Freq: Two times a day (BID) | INTRAVENOUS | Status: DC
  Administered 2021-06-09 – 2021-06-13 (×9): 1 g via INTRAVENOUS
  Filled 2021-06-09 (×9): qty 1

## 2021-06-09 NOTE — Progress Notes (Signed)
Foley removed at 13:15 will monitor for urine output.

## 2021-06-09 NOTE — Progress Notes (Signed)
PROGRESS NOTE     Stacy Dalton, is a 70 y.o. female, DOB - [birth year], ATF:[id number]  Admit date - 06/06/2021   Admitting Physician Courage Denton Brick, MD  Outpatient Primary MD for the patient is [name], FNP  LOS - 3  Chief Complaint  Patient presents with   Seizures        Brief Narrative:  70 y.o. female with significant for severe alcohol dependence, COPD, hypertension, hypothyroidism, chronic pain, depression, and anxiety presented to the ER by EMS after recurrent falls at home, EMS was concerned about possible seizures with confusion possible LOS -Patient apparently quit drinking alcohol on 05/30/2021-- -Admitted on 06/06/2021 with acute alcohol withdrawal syndrome, UTI and encephalopathy.  Assessment & Plan:   Principal Problem:   DTs (delirium tremens) ([place]) Active Problems:   COPD (chronic obstructive pulmonary disease) (HCC)   Hypothyroidism   Alcohol use disorder, severe, dependence ([place])   AKI (acute kidney injury) ([place])   1)DTs/metabolic encephalopathy.- -CT head, CT C-spine and MRI brain without acute finding -Overall doing significantly better; off Precedex for over 24 hours and currently without acute withdrawal symptoms. -Continue CIWA protocol, thiamine and folic acid. -Cessation counseling provided.   -Mentation back to baseline.   2) hypokalemia--- -magnesium WNL -Continue to follow electrolytes trend and further replete as needed.   3) acute urinary retention/UTI -UA suspicious for UTI -Follow culture results -Based on sensitivity antibiotics has been exchanged to meropenem. -Attempt voiding trial -Continue Flomax -No fever, normal WBCs.   4) hypothyroidism -continue levothyroxine   5)HTN -Overall stable and fairly well controlled -Continue amlodipine.   6) COPD/tobacco abuse -Patient reports no shortness of breath and is currently no wheezing -Cessation counseling provided -Continue as needed bronchodilators.    7)AKI -In the setting of dehydration, UTI and urinary retention -Foley catheter in place -IV fluids has been provided; after adequate fluid resuscitation renal function back to normal. -Continue to follow trend -Continue avoiding nephrotoxic agents.  -Voiding trial to be attempted today.  8) chronic pain syndrome -resume home dose of As needed tramadol. -Continue as needed Tylenol.  9) insomnia -As needed Restoril has been ordered.     Disposition/Need for in-Hospital Stay- patient unable to be discharged at this time due to UTI/urinary retention and completing DTs process.  Status is: Inpatient   Remains inpatient appropriate because: Disposition above   Dispo: The patient is from: Home              Anticipated d/c is to: Home              Anticipated d/c date is: 2-3 days              Patient currently is not medically stable to d/c. Will transfer to telemetry bed.  Physical therapy evaluation requested.  Barriers: Not Clinically Stable-     Code Status :  -  Code Status: Full Code   Family Communication:    Discussed with Neighbor at bedside Consults  :  na  DVT Prophylaxis  :   - SCDs  heparin injection 5,000 Units Start: 06/06/21 2200 SCDs Start: 06/06/21 1923 Place TED hose Start: 06/06/21 1923    Lab Results  Component Value Date   PLT 203 06/08/2021    Inpatient Medications  Scheduled Meds:  amLODipine  5 mg Oral Daily   Chlorhexidine Gluconate Cloth  6 each Topical Q0600   ferrous sulfate  325 mg Oral Q breakfast   folic acid  1 mg Oral Daily  heparin  5,000 Units Subcutaneous Q8H   influenza vaccine adjuvanted  0.5 mL Intramuscular Tomorrow-1000   levothyroxine  50 mcg Oral QAC breakfast   magnesium oxide  400 mg Oral Daily   mometasone-formoterol  2 puff Inhalation BID   multivitamin with minerals  1 tablet Oral Daily   sodium chloride flush  3 mL Intravenous Q12H   sodium chloride flush  3 mL Intravenous Q12H   tamsulosin  0.4 mg Oral QPC  supper   thiamine  100 mg Oral Daily   Or   thiamine  100 mg Intravenous Daily   vitamin B-12  1,000 mcg Oral Daily   Vitamin D (Ergocalciferol)  50,000 Units Oral Q7 days   Continuous Infusions:  sodium chloride     dextrose 5 % and 0.45% NaCl 125 mL/hr at 06/08/21 1057   meropenem (MERREM) IV Stopped (06/09/21 1323)   PRN Meds:.sodium chloride, acetaminophen **OR** acetaminophen, bisacodyl, ondansetron **OR** ondansetron (ZOFRAN) IV, polyethylene glycol, sodium chloride flush, temazepam, traMADol   Anti-infectives (From admission, onward)    Start     Dose/Rate Route Frequency Ordered Stop   06/09/21 1000  meropenem (MERREM) 1 g in sodium chloride 0.9 % 100 mL IVPB        1 g 200 mL/hr over 30 Minutes Intravenous Every 12 hours 06/09/21 0904     06/06/21 2100  cefTRIAXone (ROCEPHIN) 1 g in sodium chloride 0.9 % 100 mL IVPB  Status:  Discontinued        1 g 200 mL/hr over 30 Minutes Intravenous Every 24 hours 06/06/21 1929 06/09/21 0973         Subjective: Stacy Dalton afebrile, no chest pain, no nausea or vomiting.  Reports improvement in her abdominal pain and lower back.  Urine culture positive for resistant Morganella species patient is currently reporting no dysuria.  No active withdrawal symptoms on my examination.   Objective: Vitals:   06/08/21 1822 06/08/21 2306 06/09/21 0342 06/09/21 1412  BP: (!) 158/72 131/64 140/60 138/61  Pulse: 98 78 79 76  Resp: 18 18 18 18   Temp: 99.1 F (37.3 C) 98.5 F (36.9 C) 98.2 F (36.8 C) 98.3 F (36.8 C)  TempSrc: Oral Oral Oral Oral  SpO2: 99% 95% 96% 96%  Weight:      Height:        Intake/Output Summary (Last 24 hours) at 06/09/2021 1657 Last data filed at 06/09/2021 1500 Gross per 24 hour  Intake 720 ml  Output 850 ml  Net -130 ml   Filed Weights   06/06/21 0555 06/06/21 1120 06/07/21 0600  Weight: 50.8 kg 56.5 kg 52.5 kg     Physical Exam General exam: Alert, awake, oriented x 3, following commands  appropriately; no fever, no chest pain, no nausea or vomiting.  Patient expressed having some headaches and difficulty sleeping at night.  No withdrawal symptoms currently Respiratory system: Clear to auscultation. Respiratory effort normal.  Good saturation on room air. Cardiovascular system:RRR. No murmurs, rubs, gallops.  No JVD. Gastrointestinal system: Abdomen is nondistended, soft and nontender. No organomegaly or masses felt. Normal bowel sounds heard. Central nervous system: Alert and oriented. No focal neurological deficits. Extremities: No cyanosis, clubbing or edema. Skin: No petechiae. Psychiatry: Judgement and insight appear normal. Mood & affect appropriate.   Data Reviewed: I have personally reviewed following labs and imaging studies  CBC: Recent Labs  Lab 06/06/21 0601 06/07/21 0430 06/08/21 0415  WBC 7.6 6.9 7.1  NEUTROABS 5.0  --   --  HGB 12.2 10.4* 10.5*  HCT 37.8 33.5* 33.3*  MCV 88.9 89.3 88.1  PLT 299 224 481   Basic Metabolic Panel: Recent Labs  Lab 06/06/21 0601 06/07/21 0430 06/07/21 2211 06/08/21 0415  NA 132* 135 135 134*  K 3.0* 2.9* 3.5 3.4*  CL 91* 100 101 104  CO2 28 24 25 23   GLUCOSE 133* 141* 155* 152*  BUN 29* 11 6* 6*  CREATININE 1.39* 0.78 0.77 0.71  CALCIUM 9.6 8.5* 8.6* 8.7*  MG 1.9  --  1.3*  --    GFR: Estimated Creatinine Clearance: 48 mL/min (by C-G formula based on SCr of 0.71 mg/dL).  Liver Function Tests: Recent Labs  Lab 06/06/21 0601 06/08/21 0415  AST 34 20  ALT 17 12  ALKPHOS 76 60  BILITOT 0.4 0.2*  PROT 7.8 5.8*  ALBUMIN 4.5 3.2*   No results for input(s): LIPASE, AMYLASE in the last 168 hours.  Recent Labs  Lab 06/06/21 0659  AMMONIA 37*   Coagulation Profile: Recent Labs  Lab 06/06/21 0601  INR 0.9   CBG: Recent Labs  Lab 06/06/21 0551 06/07/21 0917 06/07/21 1504 06/08/21 0346 06/08/21 1112  GLUCAP 131* 134* 134* 148* 122*   Urine analysis:    Component Value Date/Time    COLORURINE YELLOW 06/06/2021 0601   APPEARANCEUR CLEAR 06/06/2021 0601   APPEARANCEUR Cloudy (A) 02/23/2021 1608   LABSPEC 1.005 06/06/2021 0601   PHURINE 7.0 06/06/2021 0601   GLUCOSEU NEGATIVE 06/06/2021 0601   HGBUR SMALL (A) 06/06/2021 0601   BILIRUBINUR NEGATIVE 06/06/2021 0601   BILIRUBINUR Negative 02/23/2021 1608   KETONESUR NEGATIVE 06/06/2021 0601   PROTEINUR NEGATIVE 06/06/2021 0601   UROBILINOGEN negative 07/01/2015 1541   UROBILINOGEN 0.2 10/07/2012 1928   NITRITE NEGATIVE 06/06/2021 0601   LEUKOCYTESUR MODERATE (A) 06/06/2021 0601   Sepsis Labs:  Recent Results (from the past 240 hour(s))  Urine Culture     Status: Abnormal   Collection Time: 06/06/21  6:10 AM   Specimen: Urine, Clean Catch  Result Value Ref Range Status   Specimen Description   Final    URINE, CLEAN CATCH Performed at [facility], [address] [place], [place] [address]    Special Requests   Final    NONE Performed at [facility], [address] [place], [place] [address]    Culture >=100,000 COLONIES/mL [facility] MORGANII (A)  Final   Report Status 06/09/2021 FINAL  Final   Organism ID, Bacteria MORGANELLA MORGANII (A)  Final      Susceptibility   Morganella morganii - MIC*    AMPICILLIN >=32 RESISTANT Resistant     CEFAZOLIN >=64 RESISTANT Resistant     CIPROFLOXACIN 1 RESISTANT Resistant     GENTAMICIN 8 INTERMEDIATE Intermediate     IMIPENEM 2 SENSITIVE Sensitive     NITROFURANTOIN 128 RESISTANT Resistant     TRIMETH/SULFA >=320 RESISTANT Resistant     AMPICILLIN/SULBACTAM >=32 RESISTANT Resistant     PIP/TAZO <=4 SENSITIVE Sensitive     * >=100,000 COLONIES/mL MORGANELLA MORGANII  Resp Panel by RT-PCR (Flu A&B, Covid) Nasopharyngeal Swab     Status: None   Collection Time: 06/06/21  8:16 AM   Specimen: Nasopharyngeal Swab; Nasopharyngeal(NP) swabs in vial transport medium  Result Value Ref Range Status   SARS Coronavirus 2 by RT PCR NEGATIVE NEGATIVE Final    Comment:  (NOTE) SARS-CoV-2 target nucleic acids are NOT DETECTED.  The SARS-CoV-2 RNA is generally detectable in upper respiratory specimens during the acute phase  of infection. The lowest concentration of SARS-CoV-2 viral copies this assay can detect is 138 copies/mL. A negative result does not preclude SARS-Cov-2 infection and should not be used as the sole basis for treatment or other patient management decisions. A negative result may occur with  improper specimen collection/handling, submission of specimen other than nasopharyngeal swab, presence of viral mutation(s) within the areas targeted by this assay, and inadequate number of viral copies(<138 copies/mL). A negative result must be combined with clinical observations, patient history, and epidemiological information. The expected result is Negative.  Fact Sheet for Patients:  EntrepreneurPulse.com.au  Fact Sheet for Healthcare Providers:  IncredibleEmployment.be  This test is no t yet approved or cleared by the [place] FDA and  has been authorized for detection and/or diagnosis of SARS-CoV-2 by FDA under an Emergency Use Authorization (EUA). This EUA will remain  in effect (meaning this test can be used) for the duration of the COVID-19 declaration under Section 564(b)(1) of the Act, 21 U.S.C.section 360bbb-3(b)(1), unless the authorization is terminated  or revoked sooner.       Influenza A by PCR NEGATIVE NEGATIVE Final   Influenza B by PCR NEGATIVE NEGATIVE Final    Comment: (NOTE) The Xpert Xpress SARS-CoV-2/FLU/RSV plus assay is intended as an aid in the diagnosis of influenza from Nasopharyngeal swab specimens and should not be used as a sole basis for treatment. Nasal washings and aspirates are unacceptable for Xpert Xpress SARS-CoV-2/FLU/RSV testing.  Fact Sheet for Patients: EntrepreneurPulse.com.au  Fact Sheet for Healthcare  Providers: IncredibleEmployment.be  This test is not yet approved or cleared by the [place] FDA and has been authorized for detection and/or diagnosis of SARS-CoV-2 by FDA under an Emergency Use Authorization (EUA). This EUA will remain in effect (meaning this test can be used) for the duration of the COVID-19 declaration under Section 564(b)(1) of the Act, 21 U.S.C. section 360bbb-3(b)(1), unless the authorization is terminated or revoked.  Performed at [facility], [address], [place], [place] [address]   MRSA Next Gen by PCR, Nasal     Status: None   Collection Time: 06/06/21 11:20 AM   Specimen: Nasal Mucosa; Nasal Swab  Result Value Ref Range Status   MRSA by PCR Next Gen NOT DETECTED NOT DETECTED Final    Comment: (NOTE) The GeneXpert MRSA Assay (FDA approved for NASAL specimens only), is one component of a comprehensive MRSA colonization surveillance program. It is not intended to diagnose MRSA infection nor to guide or monitor treatment for MRSA infections. Test performance is not FDA approved in patients less than 58 years old. Performed at [facility], [address] [place], [place] [address]       Radiology Studies: No results found.   Scheduled Meds:  amLODipine  5 mg Oral Daily   Chlorhexidine Gluconate Cloth  6 each Topical Q0600   ferrous sulfate  325 mg Oral Q breakfast   folic acid  1 mg Oral Daily   heparin  5,000 Units Subcutaneous Q8H   influenza vaccine adjuvanted  0.5 mL Intramuscular Tomorrow-1000   levothyroxine  50 mcg Oral QAC breakfast   magnesium oxide  400 mg Oral Daily   mometasone-formoterol  2 puff Inhalation BID   multivitamin with minerals  1 tablet Oral Daily   sodium chloride flush  3 mL Intravenous Q12H   sodium chloride flush  3 mL Intravenous Q12H   tamsulosin  0.4 mg Oral QPC supper   thiamine  100 mg Oral Daily  Or   thiamine  100 mg Intravenous Daily   vitamin B-12  1,000 mcg Oral Daily    Vitamin D (Ergocalciferol)  50,000 Units Oral Q7 days   Continuous Infusions:  sodium chloride     dextrose 5 % and 0.45% NaCl 125 mL/hr at 06/08/21 1057   meropenem (MERREM) IV Stopped (06/09/21 1323)     LOS: 3 days    Barton Dubois M.D on 06/09/2021 at 4:57 PM  Go to [email or web address] - for contact info  [facility] - Office  [phone]  If 7PM-7AM, please contact night-coverage [email or web address] Password [facility] 06/09/2021, 4:57 PM

## 2021-06-10 DIAGNOSIS — F1020 Alcohol dependence, uncomplicated: Secondary | ICD-10-CM | POA: Diagnosis not present

## 2021-06-10 DIAGNOSIS — J449 Chronic obstructive pulmonary disease, unspecified: Secondary | ICD-10-CM | POA: Diagnosis not present

## 2021-06-10 DIAGNOSIS — N179 Acute kidney failure, unspecified: Secondary | ICD-10-CM | POA: Diagnosis not present

## 2021-06-10 DIAGNOSIS — F10931 Alcohol use, unspecified with withdrawal delirium: Secondary | ICD-10-CM | POA: Diagnosis not present

## 2021-06-10 MED ORDER — TRAMADOL HCL 50 MG PO TABS
100.0000 mg | ORAL_TABLET | Freq: Four times a day (QID) | ORAL | Status: DC | PRN
  Administered 2021-06-10 – 2021-06-13 (×12): 100 mg via ORAL
  Filled 2021-06-10 (×12): qty 2

## 2021-06-10 NOTE — Progress Notes (Signed)
PROGRESS NOTE     Stacy Dalton, is a 70 y.o. female, DOB - 12/14/1950, EPP:[id number]  Admit date - 06/06/2021   Admitting Physician Courage Denton Brick, MD  Outpatient Primary MD for the patient is [name], FNP  LOS - 4  Chief Complaint  Patient presents with   Seizures        Brief Narrative:  70 y.o. female with significant for severe alcohol dependence, COPD, hypertension, hypothyroidism, chronic pain, depression, and anxiety presented to the ER by EMS after recurrent falls at home, EMS was concerned about possible seizures with confusion possible LOS -Patient apparently quit drinking alcohol on 05/30/2021-- -Admitted on 06/06/2021 with acute alcohol withdrawal syndrome, UTI and encephalopathy.  Assessment & Plan:   Principal Problem:   DTs (delirium tremens) ([place]) Active Problems:   COPD (chronic obstructive pulmonary disease) ([facility])   Hypothyroidism   Alcohol use disorder, severe, dependence ([place])   AKI (acute kidney injury) ([place])   1)DTs/metabolic encephalopathy.- -CT head, CT C-spine and MRI brain without acute finding -Overall doing significantly better; off Precedex for over 24 hours and currently without acute withdrawal symptoms. -Continue CIWA protocol, thiamine and folic acid. -Cessation counseling provided.   -Mentation back to baseline.   2) hypokalemia--- -magnesium WNL -Continue to follow electrolytes trend and further replete as needed.   3) acute urinary retention/Morganella UTI -UA suspicious for UTI -Culture results demonstrating multiresistant microorganism -Continue meropenem; ID curbside with recommendations to treat for 5 days. -Patient hemodynamically stable. -Last antibiotic day will be 06/13/2021. -Continue Flomax -No fever, normal WBCs. -Continue to maintain adequate hydration.   4) hypothyroidism -continue levothyroxine   5)HTN -Overall stable and fairly well controlled -Continue amlodipine.   6) COPD/tobacco  abuse -Patient reports no shortness of breath and is currently no wheezing -Cessation counseling provided -Continue as needed bronchodilators.   7)AKI -In the setting of dehydration, UTI and urinary retention -Foley catheter in place -IV fluids has been provided; after adequate fluid resuscitation renal function back to normal. -Continue to follow trend intermittently. -Continue avoiding/minimizing the use of nephrotoxic agents.  -Successful voiding trial with patient is demonstrating ability to urinate by herself. -Continue treatment for UTI.  8) chronic pain syndrome -resume home dose of As needed tramadol (dose adjusted for better symptom management). -Continue as needed Tylenol.  9) insomnia -Continue as needed Restoril has been ordered.     Disposition/Need for in-Hospital Stay- patient unable to be discharged at this time due to UTI/urinary retention and completing DTs process.  Status is: Inpatient   Remains inpatient appropriate because: Disposition above   Dispo: The patient is from: Home              Anticipated d/c is to: Home              Anticipated d/c date is: 2-3 days              Patient currently is not medically stable to d/c. Will transfer to telemetry bed.  Physical therapy evaluation requested.  Barriers: Not Clinically Stable-     Code Status :  -  Code Status: Full Code   Family Communication:    Discussed with Neighbor at bedside Consults  :  na  DVT Prophylaxis  :   - SCDs  heparin injection 5,000 Units Start: 06/06/21 2200 SCDs Start: 06/06/21 1923 Place TED hose Start: 06/06/21 1923    Lab Results  Component Value Date   PLT 203 06/08/2021    Inpatient Medications  Scheduled  Meds:  amLODipine  5 mg Oral Daily   ferrous sulfate  325 mg Oral Q breakfast   folic acid  1 mg Oral Daily   heparin  5,000 Units Subcutaneous Q8H   influenza vaccine adjuvanted  0.5 mL Intramuscular Tomorrow-1000   levothyroxine  50 mcg Oral QAC breakfast    magnesium oxide  400 mg Oral Daily   mometasone-formoterol  2 puff Inhalation BID   multivitamin with minerals  1 tablet Oral Daily   sodium chloride flush  3 mL Intravenous Q12H   sodium chloride flush  3 mL Intravenous Q12H   tamsulosin  0.4 mg Oral QPC supper   thiamine  100 mg Oral Daily   Or   thiamine  100 mg Intravenous Daily   vitamin B-12  1,000 mcg Oral Daily   Vitamin D (Ergocalciferol)  50,000 Units Oral Q7 days   Continuous Infusions:  sodium chloride     dextrose 5 % and 0.45% NaCl 125 mL/hr at 06/10/21 1138   meropenem (MERREM) IV 1 g (06/10/21 0915)   PRN Meds:.sodium chloride, acetaminophen **OR** acetaminophen, bisacodyl, ondansetron **OR** ondansetron (ZOFRAN) IV, polyethylene glycol, sodium chloride flush, temazepam, traMADol   Anti-infectives (From admission, onward)    Start     Dose/Rate Route Frequency Ordered Stop   06/09/21 1000  meropenem (MERREM) 1 g in sodium chloride 0.9 % 100 mL IVPB        1 g 200 mL/hr over 30 Minutes Intravenous Every 12 hours 06/09/21 0904     06/06/21 2100  cefTRIAXone (ROCEPHIN) 1 g in sodium chloride 0.9 % 100 mL IVPB  Status:  Discontinued        1 g 200 mL/hr over 30 Minutes Intravenous Every 24 hours 06/06/21 1929 06/09/21 9163         Subjective: Stacy Dalton no fever, no chest pain, no nausea, no vomiting, no shortness of breath.  No withdrawal symptoms appreciated.  Patient oriented x3.  Reporting ongoing chronic back pain not relieved with current dose of tramadol and increased frequency.  Denies dysuria, hematuria or any other complaints   Objective: Vitals:   06/09/21 2057 06/10/21 0439 06/10/21 0916 06/10/21 1225  BP: (!) 165/83 (!) 155/71 (!) 153/68 (!) 176/84  Pulse: 79 70 75 84  Resp: 14 15 16 18   Temp:  98.5 F (36.9 C)  98.2 F (36.8 C)  TempSrc:      SpO2: 98% 95% 97% 98%  Weight:      Height:        Intake/Output Summary (Last 24 hours) at 06/10/2021 1748 Last data filed at 06/10/2021  1700 Gross per 24 hour  Intake 3014.64 ml  Output 2401 ml  Net 613.64 ml   Filed Weights   06/06/21 0555 06/06/21 1120 06/07/21 0600  Weight: 50.8 kg 56.5 kg 52.5 kg     Physical Exam General exam: Alert, awake, oriented x 3; reporting ongoing chronic back pain no relief with current dose of tramadol.  Patient also expressing increased frequency.  Has been able to void by herself after Foley catheter removed.  No fever, no chest pain or shortness of breath. Respiratory system: Clear to auscultation. Respiratory effort normal.  Good air movement bilaterally.  No using accessory muscles. Cardiovascular system:RRR. No murmurs, rubs, gallops.  No JVD. Gastrointestinal system: Abdomen is nondistended, soft and nontender. No organomegaly or masses felt. Normal bowel sounds heard. Central nervous system: Alert and oriented. No focal neurological deficits. Extremities: No cyanosis or clubbing.  Chronic  lower back pain reported. Skin: No petechiae. Psychiatry: Judgement and insight appear normal. Mood & affect appropriate.   Data Reviewed: I have personally reviewed following labs and imaging studies  CBC: Recent Labs  Lab 06/06/21 0601 06/07/21 0430 06/08/21 0415  WBC 7.6 6.9 7.1  NEUTROABS 5.0  --   --   HGB 12.2 10.4* 10.5*  HCT 37.8 33.5* 33.3*  MCV 88.9 89.3 88.1  PLT 299 224 355   Basic Metabolic Panel: Recent Labs  Lab 06/06/21 0601 06/07/21 0430 06/07/21 2211 06/08/21 0415  NA 132* 135 135 134*  K 3.0* 2.9* 3.5 3.4*  CL 91* 100 101 104  CO2 28 24 25 23   GLUCOSE 133* 141* 155* 152*  BUN 29* 11 6* 6*  CREATININE 1.39* 0.78 0.77 0.71  CALCIUM 9.6 8.5* 8.6* 8.7*  MG 1.9  --  1.3*  --    GFR: Estimated Creatinine Clearance: 48 mL/min (by C-G formula based on SCr of 0.71 mg/dL).  Liver Function Tests: Recent Labs  Lab 06/06/21 0601 06/08/21 0415  AST 34 20  ALT 17 12  ALKPHOS 76 60  BILITOT 0.4 0.2*  PROT 7.8 5.8*  ALBUMIN 4.5 3.2*   No results for  input(s): LIPASE, AMYLASE in the last 168 hours.  Recent Labs  Lab 06/06/21 0659  AMMONIA 37*   Coagulation Profile: Recent Labs  Lab 06/06/21 0601  INR 0.9   CBG: Recent Labs  Lab 06/06/21 0551 06/07/21 0917 06/07/21 1504 06/08/21 0346 06/08/21 1112  GLUCAP 131* 134* 134* 148* 122*   Urine analysis:    Component Value Date/Time   COLORURINE YELLOW 06/06/2021 0601   APPEARANCEUR CLEAR 06/06/2021 0601   APPEARANCEUR Cloudy (A) 02/23/2021 1608   LABSPEC 1.005 06/06/2021 0601   PHURINE 7.0 06/06/2021 0601   GLUCOSEU NEGATIVE 06/06/2021 0601   HGBUR SMALL (A) 06/06/2021 0601   BILIRUBINUR NEGATIVE 06/06/2021 0601   BILIRUBINUR Negative 02/23/2021 1608   KETONESUR NEGATIVE 06/06/2021 0601   PROTEINUR NEGATIVE 06/06/2021 0601   UROBILINOGEN negative 07/01/2015 1541   UROBILINOGEN 0.2 10/07/2012 1928   NITRITE NEGATIVE 06/06/2021 0601   LEUKOCYTESUR MODERATE (A) 06/06/2021 0601   Sepsis Labs:  Recent Results (from the past 240 hour(s))  Urine Culture     Status: Abnormal   Collection Time: 06/06/21  6:10 AM   Specimen: Urine, Clean Catch  Result Value Ref Range Status   Specimen Description   Final    URINE, CLEAN CATCH Performed at [facility] [address], [place], [place] [address]    Special Requests   Final    NONE Performed at [facility], [address], [place],  [address]    Culture >=100,000 COLONIES/mL [facility] MORGANII (A)  Final   Report Status 06/09/2021 FINAL  Final   Organism ID, Bacteria MORGANELLA MORGANII (A)  Final      Susceptibility   Morganella morganii - MIC*    AMPICILLIN >=32 RESISTANT Resistant     CEFAZOLIN >=64 RESISTANT Resistant     CIPROFLOXACIN 1 RESISTANT Resistant     GENTAMICIN 8 INTERMEDIATE Intermediate     IMIPENEM 2 SENSITIVE Sensitive     NITROFURANTOIN 128 RESISTANT Resistant     TRIMETH/SULFA >=320 RESISTANT Resistant     AMPICILLIN/SULBACTAM >=32 RESISTANT Resistant     PIP/TAZO <=4 SENSITIVE  Sensitive     * >=100,000 COLONIES/mL MORGANELLA MORGANII  Resp Panel by RT-PCR (Flu A&B, Covid) Nasopharyngeal Swab     Status: None   Collection Time: 06/06/21  8:16 AM   Specimen: Nasopharyngeal Swab; Nasopharyngeal(NP) swabs in vial transport medium  Result Value Ref Range Status   SARS Coronavirus 2 by RT PCR NEGATIVE NEGATIVE Final    Comment: (NOTE) SARS-CoV-2 target nucleic acids are NOT DETECTED.  The SARS-CoV-2 RNA is generally detectable in upper respiratory specimens during the acute phase of infection. The lowest concentration of SARS-CoV-2 viral copies this assay can detect is 138 copies/mL. A negative result does not preclude SARS-Cov-2 infection and should not be used as the sole basis for treatment or other patient management decisions. A negative result may occur with  improper specimen collection/handling, submission of specimen other than nasopharyngeal swab, presence of viral mutation(s) within the areas targeted by this assay, and inadequate number of viral copies(<138 copies/mL). A negative result must be combined with clinical observations, patient history, and epidemiological information. The expected result is Negative.  Fact Sheet for Patients:  EntrepreneurPulse.com.au  Fact Sheet for Healthcare Providers:  IncredibleEmployment.be  This test is no t yet approved or cleared by the [place] FDA and  has been authorized for detection and/or diagnosis of SARS-CoV-2 by FDA under an Emergency Use Authorization (EUA). This EUA will remain  in effect (meaning this test can be used) for the duration of the COVID-19 declaration under Section 564(b)(1) of the Act, 21 U.S.C.section 360bbb-3(b)(1), unless the authorization is terminated  or revoked sooner.       Influenza A by PCR NEGATIVE NEGATIVE Final   Influenza B by PCR NEGATIVE NEGATIVE Final    Comment: (NOTE) The Xpert Xpress SARS-CoV-2/FLU/RSV plus assay is  intended as an aid in the diagnosis of influenza from Nasopharyngeal swab specimens and should not be used as a sole basis for treatment. Nasal washings and aspirates are unacceptable for Xpert Xpress SARS-CoV-2/FLU/RSV testing.  Fact Sheet for Patients: EntrepreneurPulse.com.au  Fact Sheet for Healthcare Providers: IncredibleEmployment.be  This test is not yet approved or cleared by the [place] FDA and has been authorized for detection and/or diagnosis of SARS-CoV-2 by FDA under an Emergency Use Authorization (EUA). This EUA will remain in effect (meaning this test can be used) for the duration of the COVID-19 declaration under Section 564(b)(1) of the Act, 21 U.S.C. section 360bbb-3(b)(1), unless the authorization is terminated or revoked.  Performed at [facility], [address], [place], [place] [address]   MRSA Next Gen by PCR, Nasal     Status: None   Collection Time: 06/06/21 11:20 AM   Specimen: Nasal Mucosa; Nasal Swab  Result Value Ref Range Status   MRSA by PCR Next Gen NOT DETECTED NOT DETECTED Final    Comment: (NOTE) The GeneXpert MRSA Assay (FDA approved for NASAL specimens only), is one component of a comprehensive MRSA colonization surveillance program. It is not intended to diagnose MRSA infection nor to guide or monitor treatment for MRSA infections. Test performance is not FDA approved in patients less than 41 years old. Performed at [facility], [address], [place], [place] [address]       Radiology Studies: No results found.   Scheduled Meds:  amLODipine  5 mg Oral Daily   ferrous sulfate  325 mg Oral Q breakfast   folic acid  1 mg Oral Daily   heparin  5,000 Units Subcutaneous Q8H   influenza vaccine adjuvanted  0.5 mL Intramuscular Tomorrow-1000   levothyroxine  50 mcg Oral QAC breakfast   magnesium oxide  400 mg Oral Daily   mometasone-formoterol  2 puff Inhalation BID  multivitamin with  minerals  1 tablet Oral Daily   sodium chloride flush  3 mL Intravenous Q12H   sodium chloride flush  3 mL Intravenous Q12H   tamsulosin  0.4 mg Oral QPC supper   thiamine  100 mg Oral Daily   Or   thiamine  100 mg Intravenous Daily   vitamin B-12  1,000 mcg Oral Daily   Vitamin D (Ergocalciferol)  50,000 Units Oral Q7 days   Continuous Infusions:  sodium chloride     dextrose 5 % and 0.45% NaCl 125 mL/hr at 06/10/21 1138   meropenem (MERREM) IV 1 g (06/10/21 0915)     LOS: 4 days    Barton Dubois M.D on 06/10/2021 at 5:48 PM  Go to [email or web address] - for contact info  [facility] - Office  [phone]  If 7PM-7AM, please contact night-coverage [email or web address] Password [place] [facility] 06/10/2021, 5:48 PM

## 2021-06-11 DIAGNOSIS — J449 Chronic obstructive pulmonary disease, unspecified: Secondary | ICD-10-CM | POA: Diagnosis not present

## 2021-06-11 DIAGNOSIS — F10931 Alcohol use, unspecified with withdrawal delirium: Secondary | ICD-10-CM | POA: Diagnosis not present

## 2021-06-11 DIAGNOSIS — F1020 Alcohol dependence, uncomplicated: Secondary | ICD-10-CM | POA: Diagnosis not present

## 2021-06-11 DIAGNOSIS — N179 Acute kidney failure, unspecified: Secondary | ICD-10-CM | POA: Diagnosis not present

## 2021-06-11 MED ORDER — SERTRALINE HCL 50 MG PO TABS
50.0000 mg | ORAL_TABLET | Freq: Every day | ORAL | Status: DC
  Administered 2021-06-11 – 2021-06-12 (×2): 50 mg via ORAL
  Filled 2021-06-11 (×2): qty 1

## 2021-06-11 NOTE — Progress Notes (Signed)
PROGRESS NOTE     Stacy Dalton, is a 70 y.o. female, DOB - [birth year], DQQ:[id number]  Admit date - 06/06/2021   Admitting Physician Courage Denton Brick, MD  Outpatient Primary MD for the patient is [name], FNP  LOS - 5  Chief Complaint  Patient presents with   Seizures        Brief Narrative:  70 y.o. female with significant for severe alcohol dependence, COPD, hypertension, hypothyroidism, chronic pain, depression, and anxiety presented to the ER by EMS after recurrent falls at home, EMS was concerned about possible seizures with confusion possible LOS -Patient apparently quit drinking alcohol on 05/30/2021-- -Admitted on 06/06/2021 with acute alcohol withdrawal syndrome, UTI and encephalopathy.  Assessment & Plan:   Principal Problem:   DTs (delirium tremens) ([place]) Active Problems:   COPD (chronic obstructive pulmonary disease) ([facility])   Hypothyroidism   Alcohol use disorder, severe, dependence ([place])   AKI (acute kidney injury) ([place])   1)DTs/metabolic encephalopathy.- -CT head, CT C-spine and MRI brain without acute finding -Overall doing significantly better; off Precedex for over 48 hours and currently without acute withdrawal symptoms. -Continue CIWA protocol, thiamine and folic acid. -Cessation counseling provided.   -Mentation back to baseline.   2) hypokalemia--- -magnesium WNL -Continue to follow electrolytes trend and further replete as needed.   3) acute urinary retention/Morganella UTI -UA suspicious for UTI -Culture results demonstrating multi-drug resistant microorganism -Continue meropenem; ID curbside with recommendations to treat for 5 days. Currently day 3/5 -Patient hemodynamically stable. -Last antibiotic day will be 06/13/2021. -Continue Flomax -No fever, normal WBCs. -Continue to maintain adequate hydration.   4) hypothyroidism -continue levothyroxine   5)HTN -Overall stable and fairly well controlled -Continue amlodipine.    6) COPD/tobacco abuse -Patient reports no shortness of breath and is currently no wheezing -Cessation counseling provided -Continue as needed bronchodilators.   7)AKI -In the setting of dehydration, UTI and urinary retention -Foley catheter in place -IV fluids has been provided; after adequate fluid resuscitation renal function back to normal. -Continue to follow trend intermittently. -Continue avoiding/minimizing the use of nephrotoxic agents.  -Successful voiding trial with patient is demonstrating ability to urinate by herself. -Continue treatment for UTI.  8) chronic pain syndrome -resume home dose of As needed tramadol (dose adjusted for better symptom management). -Continue as needed Tylenol.  9) insomnia -Continue as needed Restoril as ordered.  10) depression/anxiety -Resume the use of Zoloft -Patient denies suicidal ideation or hallucinations.     Disposition/Need for in-Hospital Stay- patient unable to be discharged at this time due to UTI/urinary retention and completing DTs process.  Status is: Inpatient   Remains inpatient appropriate because: Disposition above   Dispo: The patient is from: Home              Anticipated d/c is to: Home              Anticipated d/c date is: 2-3 days              Patient currently is not medically stable to d/c. Will transfer to telemetry bed.  Physical therapy evaluation requested.  Barriers: Not Clinically Stable-     Code Status :  -  Code Status: Full Code   Family Communication:    Discussed with Neighbor at bedside Consults  :  na  DVT Prophylaxis  :   - SCDs  heparin injection 5,000 Units Start: 06/06/21 2200 SCDs Start: 06/06/21 1923 Place TED hose Start: 06/06/21 1923    Lab  Results  Component Value Date   PLT 203 06/08/2021    Inpatient Medications  Scheduled Meds:  amLODipine  5 mg Oral Daily   ferrous sulfate  325 mg Oral Q breakfast   folic acid  1 mg Oral Daily   heparin  5,000 Units  Subcutaneous Q8H   influenza vaccine adjuvanted  0.5 mL Intramuscular Tomorrow-1000   levothyroxine  50 mcg Oral QAC breakfast   magnesium oxide  400 mg Oral Daily   mometasone-formoterol  2 puff Inhalation BID   multivitamin with minerals  1 tablet Oral Daily   sertraline  50 mg Oral QHS   sodium chloride flush  3 mL Intravenous Q12H   sodium chloride flush  3 mL Intravenous Q12H   tamsulosin  0.4 mg Oral QPC supper   thiamine  100 mg Oral Daily   Or   thiamine  100 mg Intravenous Daily   vitamin B-12  1,000 mcg Oral Daily   Vitamin D (Ergocalciferol)  50,000 Units Oral Q7 days   Continuous Infusions:  sodium chloride     dextrose 5 % and 0.45% NaCl 125 mL/hr at 06/11/21 1348   meropenem (MERREM) IV 1 g (06/11/21 0807)   PRN Meds:.sodium chloride, acetaminophen **OR** acetaminophen, bisacodyl, ondansetron **OR** ondansetron (ZOFRAN) IV, polyethylene glycol, sodium chloride flush, temazepam, traMADol   Anti-infectives (From admission, onward)    Start     Dose/Rate Route Frequency Ordered Stop   06/09/21 1000  meropenem (MERREM) 1 g in sodium chloride 0.9 % 100 mL IVPB        1 g 200 mL/hr over 30 Minutes Intravenous Every 12 hours 06/09/21 0904     06/06/21 2100  cefTRIAXone (ROCEPHIN) 1 g in sodium chloride 0.9 % 100 mL IVPB  Status:  Discontinued        1 g 200 mL/hr over 30 Minutes Intravenous Every 24 hours 06/06/21 1929 06/09/21 2505         Subjective: [name] no fever, no chest pain, no nausea, no vomiting.  Currently expressing no dysuria.  Overall feeling better.  Continue having intermittent chronic back pain  Objective: Vitals:   06/10/21 1225 06/10/21 2010 06/11/21 0452 06/11/21 1419  BP: (!) 176/84 (!) 177/78 (!) 169/83 134/63  Pulse: 84 77 79 75  Resp: 18 12 16 18   Temp: 98.2 F (36.8 C) 98.8 F (37.1 C) 98.8 F (37.1 C) 98.1 F (36.7 C)  TempSrc:  Oral Oral Oral  SpO2: 98% 96% 98% 95%  Weight:      Height:        Intake/Output Summary  (Last 24 hours) at 06/11/2021 1624 Last data filed at 06/11/2021 1300 Gross per 24 hour  Intake 640 ml  Output 1800 ml  Net -1160 ml   Filed Weights   06/06/21 0555 06/06/21 1120 06/07/21 0600  Weight: 50.8 kg 56.5 kg 52.5 kg     Physical Exam General exam: Alert, awake, oriented x 3; in no acute distress, afebrile, no nausea, no vomiting.  Reports no dysuria and overall feeling better. Respiratory system: Clear to auscultation. Respiratory effort normal.  No requiring oxygen supplementation.  Good saturation on room air. Cardiovascular system:RRR. No murmurs, rubs, gallops.  No JVD. Gastrointestinal system: Abdomen is nondistended, soft and nontender. No organomegaly or masses felt. Normal bowel sounds heard. Central nervous system: Alert and oriented. No focal neurological deficits. Extremities: No cyanosis or clubbing. Skin: No petechiae. Psychiatry: Judgement and insight appear normal.  Flat affect; patient expressing intermittent episode  of crying spells and feeling anxious.  Data Reviewed: I have personally reviewed following labs and imaging studies  CBC: Recent Labs  Lab 06/06/21 0601 06/07/21 0430 06/08/21 0415  WBC 7.6 6.9 7.1  NEUTROABS 5.0  --   --   HGB 12.2 10.4* 10.5*  HCT 37.8 33.5* 33.3*  MCV 88.9 89.3 88.1  PLT 299 224 462   Basic Metabolic Panel: Recent Labs  Lab 06/06/21 0601 06/07/21 0430 06/07/21 2211 06/08/21 0415  NA 132* 135 135 134*  K 3.0* 2.9* 3.5 3.4*  CL 91* 100 101 104  CO2 28 24 25 23   GLUCOSE 133* 141* 155* 152*  BUN 29* 11 6* 6*  CREATININE 1.39* 0.78 0.77 0.71  CALCIUM 9.6 8.5* 8.6* 8.7*  MG 1.9  --  1.3*  --    GFR: Estimated Creatinine Clearance: 48 mL/min (by C-G formula based on SCr of 0.71 mg/dL).  Liver Function Tests: Recent Labs  Lab 06/06/21 0601 06/08/21 0415  AST 34 20  ALT 17 12  ALKPHOS 76 60  BILITOT 0.4 0.2*  PROT 7.8 5.8*  ALBUMIN 4.5 3.2*   No results for input(s): LIPASE, AMYLASE in the last 168  hours.  Recent Labs  Lab 06/06/21 0659  AMMONIA 37*   Coagulation Profile: Recent Labs  Lab 06/06/21 0601  INR 0.9   CBG: Recent Labs  Lab 06/06/21 0551 06/07/21 0917 06/07/21 1504 06/08/21 0346 06/08/21 1112  GLUCAP 131* 134* 134* 148* 122*   Urine analysis:    Component Value Date/Time   COLORURINE YELLOW 06/06/2021 0601   APPEARANCEUR CLEAR 06/06/2021 0601   APPEARANCEUR Cloudy (A) 02/23/2021 1608   LABSPEC 1.005 06/06/2021 0601   PHURINE 7.0 06/06/2021 0601   GLUCOSEU NEGATIVE 06/06/2021 0601   HGBUR SMALL (A) 06/06/2021 0601   BILIRUBINUR NEGATIVE 06/06/2021 0601   BILIRUBINUR Negative 02/23/2021 1608   KETONESUR NEGATIVE 06/06/2021 0601   PROTEINUR NEGATIVE 06/06/2021 0601   UROBILINOGEN negative 07/01/2015 1541   UROBILINOGEN 0.2 10/07/2012 1928   NITRITE NEGATIVE 06/06/2021 0601   LEUKOCYTESUR MODERATE (A) 06/06/2021 0601   Sepsis Labs:  Recent Results (from the past 240 hour(s))  Urine Culture     Status: Abnormal   Collection Time: 06/06/21  6:10 AM   Specimen: Urine, Clean Catch  Result Value Ref Range Status   Specimen Description   Final    URINE, CLEAN CATCH Performed at [facility], [address], [place], [place] [address]    Special Requests   Final    NONE Performed at [facility], [address] [place], [place] [address]    Culture >=100,000 COLONIES/mL [facility] MORGANII (A)  Final   Report Status 06/09/2021 FINAL  Final   Organism ID, Bacteria MORGANELLA MORGANII (A)  Final      Susceptibility   Morganella morganii - MIC*    AMPICILLIN >=32 RESISTANT Resistant     CEFAZOLIN >=64 RESISTANT Resistant     CIPROFLOXACIN 1 RESISTANT Resistant     GENTAMICIN 8 INTERMEDIATE Intermediate     IMIPENEM 2 SENSITIVE Sensitive     NITROFURANTOIN 128 RESISTANT Resistant     TRIMETH/SULFA >=320 RESISTANT Resistant     AMPICILLIN/SULBACTAM >=32 RESISTANT Resistant     PIP/TAZO <=4 SENSITIVE Sensitive     * >=100,000 COLONIES/mL  MORGANELLA MORGANII  Resp Panel by RT-PCR (Flu A&B, Covid) Nasopharyngeal Swab     Status: None   Collection Time: 06/06/21  8:16 AM   Specimen: Nasopharyngeal Swab; Nasopharyngeal(NP) swabs in vial transport  medium  Result Value Ref Range Status   SARS Coronavirus 2 by RT PCR NEGATIVE NEGATIVE Final    Comment: (NOTE) SARS-CoV-2 target nucleic acids are NOT DETECTED.  The SARS-CoV-2 RNA is generally detectable in upper respiratory specimens during the acute phase of infection. The lowest concentration of SARS-CoV-2 viral copies this assay can detect is 138 copies/mL. A negative result does not preclude SARS-Cov-2 infection and should not be used as the sole basis for treatment or other patient management decisions. A negative result may occur with  improper specimen collection/handling, submission of specimen other than nasopharyngeal swab, presence of viral mutation(s) within the areas targeted by this assay, and inadequate number of viral copies(<138 copies/mL). A negative result must be combined with clinical observations, patient history, and epidemiological information. The expected result is Negative.  Fact Sheet for Patients:  EntrepreneurPulse.com.au  Fact Sheet for Healthcare Providers:  IncredibleEmployment.be  This test is no t yet approved or cleared by the [place] FDA and  has been authorized for detection and/or diagnosis of SARS-CoV-2 by FDA under an Emergency Use Authorization (EUA). This EUA will remain  in effect (meaning this test can be used) for the duration of the COVID-19 declaration under Section 564(b)(1) of the Act, 21 U.S.C.section 360bbb-3(b)(1), unless the authorization is terminated  or revoked sooner.       Influenza A by PCR NEGATIVE NEGATIVE Final   Influenza B by PCR NEGATIVE NEGATIVE Final    Comment: (NOTE) The Xpert Xpress SARS-CoV-2/FLU/RSV plus assay is intended as an aid in the diagnosis of  influenza from Nasopharyngeal swab specimens and should not be used as a sole basis for treatment. Nasal washings and aspirates are unacceptable for Xpert Xpress SARS-CoV-2/FLU/RSV testing.  Fact Sheet for Patients: EntrepreneurPulse.com.au  Fact Sheet for Healthcare Providers: IncredibleEmployment.be  This test is not yet approved or cleared by the [place] FDA and has been authorized for detection and/or diagnosis of SARS-CoV-2 by FDA under an Emergency Use Authorization (EUA). This EUA will remain in effect (meaning this test can be used) for the duration of the COVID-19 declaration under Section 564(b)(1) of the Act, 21 U.S.C. section 360bbb-3(b)(1), unless the authorization is terminated or revoked.  Performed at [facility], [address], [place], [place] [address]   MRSA Next Gen by PCR, Nasal     Status: None   Collection Time: 06/06/21 11:20 AM   Specimen: Nasal Mucosa; Nasal Swab  Result Value Ref Range Status   MRSA by PCR Next Gen NOT DETECTED NOT DETECTED Final    Comment: (NOTE) The GeneXpert MRSA Assay (FDA approved for NASAL specimens only), is one component of a comprehensive MRSA colonization surveillance program. It is not intended to diagnose MRSA infection nor to guide or monitor treatment for MRSA infections. Test performance is not FDA approved in patients less than 6 years old. Performed at [facility], [address], [place], [place] [address]       Radiology Studies: No results found.   Scheduled Meds:  amLODipine  5 mg Oral Daily   ferrous sulfate  325 mg Oral Q breakfast   folic acid  1 mg Oral Daily   heparin  5,000 Units Subcutaneous Q8H   influenza vaccine adjuvanted  0.5 mL Intramuscular Tomorrow-1000   levothyroxine  50 mcg Oral QAC breakfast   magnesium oxide  400 mg Oral Daily   mometasone-formoterol  2 puff Inhalation BID   multivitamin with minerals  1 tablet Oral Daily   sertraline  50  mg Oral QHS   sodium chloride flush  3 mL Intravenous Q12H   sodium chloride flush  3 mL Intravenous Q12H   tamsulosin  0.4 mg Oral QPC supper   thiamine  100 mg Oral Daily   Or   thiamine  100 mg Intravenous Daily   vitamin B-12  1,000 mcg Oral Daily   Vitamin D (Ergocalciferol)  50,000 Units Oral Q7 days   Continuous Infusions:  sodium chloride     dextrose 5 % and 0.45% NaCl 125 mL/hr at 06/11/21 1348   meropenem (MERREM) IV 1 g (06/11/21 0807)     LOS: 5 days    Barton Dubois M.D on 06/11/2021 at 4:24 PM  Go to [email or web address] - for contact info  [facility] - Office  [phone]  If 7PM-7AM, please contact night-coverage [email or web address] Password [facility] 06/11/2021, 4:24 PM

## 2021-06-11 NOTE — Progress Notes (Signed)
Physical Therapy Treatment Patient Details Name: Stacy Dalton MRN: [id number] DOB: 04-May-1951 Today's Date: 06/11/2021   History of Present Illness Stacy Dalton  is a 70 y.o. female with significant for severe alcohol dependence, COPD, hypertension, hypothyroidism, chronic pain, depression, and anxiety presented to the ER by EMS after recurrent falls at home, EMS was concerned about possible seizures with confusion possible LOS  -Patient apparently quit drinking alcohol on 05/30/2021--  -She had become increasingly more confused over the last few days    PT Comments    Patient demonstrates good return for transferring to/from commode in bathroom and ambulating in room/hallway without AD and when pushing IV pole Modified Independent.  Plan:  Patient discharged from physical therapy to care of nursing for ambulation daily as tolerated for length of stay.     Recommendations for follow up therapy are one component of a multi-disciplinary discharge planning process, led by the attending physician.  Recommendations may be updated based on patient status, additional functional criteria and insurance authorization.  Follow Up Recommendations  Home health PT     Assistance Recommended at Discharge PRN  Equipment Recommendations  None recommended by PT    Recommendations for Other Services       Precautions / Restrictions Precautions Precautions: None Restrictions Weight Bearing Restrictions: No     Mobility  Bed Mobility Overal bed mobility: Modified Independent                  Transfers Overall transfer level: Modified independent                 General transfer comment: demonstrates good return for transferring to/from commode in bathroom and to chair at bedside without problem    Ambulation/Gait Ambulation/Gait assistance: Modified independent (Device/Increase time) Gait Distance (Feet): 150 Feet Assistive device: None;IV Pole Gait  Pattern/deviations: WFL(Within Functional Limits) Gait velocity: slightly decreased     General Gait Details: grossly WFL with good return for ambulation in room and hallway without AD and when pushing IV pole   Stairs             Wheelchair Mobility    Modified Rankin (Stroke Patients Only)       Balance Overall balance assessment: No apparent balance deficits (not formally assessed)                                          Cognition Arousal/Alertness: Awake/alert Behavior During Therapy: WFL for tasks assessed/performed Overall Cognitive Status: Within Functional Limits for tasks assessed                                          Exercises      General Comments        Pertinent Vitals/Pain Pain Assessment: 0-10 Pain Score: 8  Pain Location: low back Pain Descriptors / Indicators: Aching Pain Intervention(s): Limited activity within patient's tolerance;Monitored during session    Home Living                          Prior Function            PT Goals (current goals can now be found in the care plan section) Acute Rehab PT Goals Patient Stated Goal: return home with  family/friends to assist PT Goal Formulation: With patient Time For Goal Achievement: 06/11/21 Potential to Achieve Goals: Good Progress towards PT goals: Goals met/education completed, patient discharged from PT    Frequency           PT Plan Current plan remains appropriate;Other (comment) (patient discharged to care of nursing for ambulation daily as tolerated)    Co-evaluation              AM-PAC PT "6 Clicks" Mobility   Outcome Measure  Help needed turning from your back to your side while in a flat bed without using bedrails?: None Help needed moving from lying on your back to sitting on the side of a flat bed without using bedrails?: None Help needed moving to and from a bed to a chair (including a wheelchair)?:  None Help needed standing up from a chair using your arms (e.g., wheelchair or bedside chair)?: None Help needed to walk in hospital room?: None Help needed climbing 3-5 steps with a railing? : A Little 6 Click Score: 23    End of Session   Activity Tolerance: Patient tolerated treatment well Patient left: in chair;with call bell/phone within reach Nurse Communication: Mobility status PT Visit Diagnosis: Unsteadiness on feet (R26.81);Other abnormalities of gait and mobility (R26.89);Muscle weakness (generalized) (M62.81)     Time: 8979-1504 PT Time Calculation (min) (ACUTE ONLY): 12 min  Charges:  $Gait Training: 8-22 mins                     11:04 AM, 06/11/21 Lonell Grandchild, MPT Physical Therapist with [facility] [phone] office [phone] mobile phone

## 2021-06-11 NOTE — Progress Notes (Signed)
Ambulated at steady pace in hallway pushing IV pole.  Went down hall and back and then around horseshoe and down to elevators.

## 2021-06-12 DIAGNOSIS — F1020 Alcohol dependence, uncomplicated: Secondary | ICD-10-CM | POA: Diagnosis not present

## 2021-06-12 DIAGNOSIS — F10931 Alcohol use, unspecified with withdrawal delirium: Secondary | ICD-10-CM | POA: Diagnosis not present

## 2021-06-12 DIAGNOSIS — J449 Chronic obstructive pulmonary disease, unspecified: Secondary | ICD-10-CM | POA: Diagnosis not present

## 2021-06-12 DIAGNOSIS — N179 Acute kidney failure, unspecified: Secondary | ICD-10-CM | POA: Diagnosis not present

## 2021-06-12 MED ORDER — MOMETASONE FURO-FORMOTEROL FUM 200-5 MCG/ACT IN AERO: 2.0000 | INHALATION_SPRAY | Freq: Two times a day (BID) | RESPIRATORY_TRACT | Status: DC | PRN

## 2021-06-12 NOTE — Progress Notes (Signed)
PROGRESS NOTE     Stacy Dalton, is a 70 y.o. female, DOB - 22-Jul-1950, GUR:[id number]  Admit date - 06/06/2021   Admitting Physician Courage Denton Brick, MD  Outpatient Primary MD for the patient is [name], FNP  LOS - 6  Chief Complaint  Patient presents with   Seizures        Brief Narrative:  70 y.o. female with significant for severe alcohol dependence, COPD, hypertension, hypothyroidism, chronic pain, depression, and anxiety presented to the ER by EMS after recurrent falls at home, EMS was concerned about possible seizures with confusion possible LOS -Patient apparently quit drinking alcohol on 05/30/2021-- -Admitted on 06/06/2021 with acute alcohol withdrawal syndrome, UTI and encephalopathy.  Assessment & Plan:   Principal Problem:   DTs (delirium tremens) ([facility]) Active Problems:   COPD (chronic obstructive pulmonary disease) ([facility])   Hypothyroidism   Alcohol use disorder, severe, dependence ([place])   AKI (acute kidney injury) ([place])   1)DTs/metabolic encephalopathy.- -CT head, CT C-spine and MRI brain without acute finding -Overall doing significantly better; off Precedex for over 48 hours and currently without acute withdrawal symptoms. -Continue CIWA protocol, thiamine and folic acid. -Cessation counseling provided.   -Mentation back to baseline.   2) hypokalemia--- -magnesium WNL -Continue to follow electrolytes trend and further replete as needed.   3) acute urinary retention/Morganella UTI -UA suspicious for UTI -Culture results demonstrating multi-drug resistant microorganism -Continue meropenem; ID curbside with recommendations to treat for 5 days. Currently day 4/5 -Patient hemodynamically stable. -Last antibiotic day will be 06/13/2021. -Continue Flomax -No fever, normal WBCs. -Continue to maintain adequate hydration.   4) hypothyroidism -continue levothyroxine   5)HTN -Overall stable and fairly well controlled -Continue amlodipine.    6) COPD/tobacco abuse -Patient reports no shortness of breath and is currently no wheezing -Cessation counseling provided -Continue as needed bronchodilators.   7)AKI -In the setting of dehydration, UTI and urinary retention -Foley catheter in place -IV fluids has been provided; after adequate fluid resuscitation renal function back to normal. -Continue to follow trend intermittently. -Continue avoiding/minimizing the use of nephrotoxic agents.  -Successful voiding trial with patient is demonstrating ability to urinate by herself. -Continue treatment for UTI.  8) chronic pain syndrome -resume home dose of As needed tramadol (dose adjusted for better symptom management). -Continue as needed Tylenol.  9) insomnia -Continue as needed Restoril as ordered.  10) depression/anxiety -Resume the use of Zoloft -Patient denies suicidal ideation or hallucinations.     Disposition/Need for in-Hospital Stay- patient unable to be discharged at this time due to UTI/urinary retention and completing DTs process.  Status is: Inpatient   Remains inpatient appropriate because: Disposition above   Dispo: The patient is from: Home              Anticipated d/c is to: Home              Anticipated d/c date is: 2-3 days              Patient currently is not medically stable to d/c. Will transfer to telemetry bed.  Physical therapy evaluation requested.  Barriers: Not Clinically Stable-     Code Status :  -  Code Status: Full Code   Family Communication:    Discussed with Neighbor at bedside Consults  :  na  DVT Prophylaxis  :   - SCDs  heparin injection 5,000 Units Start: 06/06/21 2200 SCDs Start: 06/06/21 1923 Place TED hose Start: 06/06/21 1923    Lab  Results  Component Value Date   PLT 203 06/08/2021    Inpatient Medications  Scheduled Meds:  amLODipine  5 mg Oral Daily   ferrous sulfate  325 mg Oral Q breakfast   folic acid  1 mg Oral Daily   heparin  5,000 Units  Subcutaneous Q8H   influenza vaccine adjuvanted  0.5 mL Intramuscular Tomorrow-1000   levothyroxine  50 mcg Oral QAC breakfast   magnesium oxide  400 mg Oral Daily   mometasone-formoterol  2 puff Inhalation BID   multivitamin with minerals  1 tablet Oral Daily   sertraline  50 mg Oral QHS   sodium chloride flush  3 mL Intravenous Q12H   sodium chloride flush  3 mL Intravenous Q12H   tamsulosin  0.4 mg Oral QPC supper   thiamine  100 mg Oral Daily   Or   thiamine  100 mg Intravenous Daily   vitamin B-12  1,000 mcg Oral Daily   Vitamin D (Ergocalciferol)  50,000 Units Oral Q7 days   Continuous Infusions:  sodium chloride     dextrose 5 % and 0.45% NaCl 125 mL/hr at 06/11/21 1348   meropenem (MERREM) IV 1 g (06/12/21 0825)   PRN Meds:.sodium chloride, acetaminophen **OR** acetaminophen, bisacodyl, ondansetron **OR** ondansetron (ZOFRAN) IV, polyethylene glycol, sodium chloride flush, temazepam, traMADol   Anti-infectives (From admission, onward)    Start     Dose/Rate Route Frequency Ordered Stop   06/09/21 1000  meropenem (MERREM) 1 g in sodium chloride 0.9 % 100 mL IVPB        1 g 200 mL/hr over 30 Minutes Intravenous Every 12 hours 06/09/21 0904     06/06/21 2100  cefTRIAXone (ROCEPHIN) 1 g in sodium chloride 0.9 % 100 mL IVPB  Status:  Discontinued        1 g 200 mL/hr over 30 Minutes Intravenous Every 24 hours 06/06/21 1929 06/09/21 5284         Subjective: Stacy Dalton no fever, no CP, no SOB, no nausea, no vomiting, no dysuria. Reports chronic pain is under better control currently.  Objective: Vitals:   06/11/21 0452 06/11/21 1419 06/11/21 2200 06/12/21 1306  BP: (!) 169/83 134/63 (!) 158/77 (!) 143/70  Pulse: 79 75 78 74  Resp: 16 18 20 18   Temp: 98.8 F (37.1 C) 98.1 F (36.7 C) 99.1 F (37.3 C) 98.3 F (36.8 C)  TempSrc: Oral Oral Oral Oral  SpO2: 98% 95% 96% 96%  Weight:      Height:        Intake/Output Summary (Last 24 hours) at 06/12/2021  1456 Last data filed at 06/12/2021 1310 Gross per 24 hour  Intake 600 ml  Output 1750 ml  Net -1150 ml   Filed Weights   06/06/21 0555 06/06/21 1120 06/07/21 0600  Weight: 50.8 kg 56.5 kg 52.5 kg     Physical Exam General exam: Alert, awake, oriented x 3; reporting good night rest and feeling better. No CP, no nausea, no vomtiing, no dysuria.  Respiratory system: Clear to auscultation. Respiratory effort normal. No using accessory muscles, no requiring O2 supplementation. Cardiovascular system:RRR. No murmurs, rubs, gallops. Gastrointestinal system: Abdomen is nondistended, soft and nontender. No organomegaly or masses felt. Normal bowel sounds heard. Central nervous system: Alert and oriented. No focal neurological deficits. Extremities: No C/C/E, +pedal pulses Skin: No rashes, lesions or ulcers Psychiatry: Judgement and insight appear normal. Mood & affect appropriate.    Data Reviewed: I have personally reviewed following labs and  imaging studies  CBC: Recent Labs  Lab 06/06/21 0601 06/07/21 0430 06/08/21 0415  WBC 7.6 6.9 7.1  NEUTROABS 5.0  --   --   HGB 12.2 10.4* 10.5*  HCT 37.8 33.5* 33.3*  MCV 88.9 89.3 88.1  PLT 299 224 009   Basic Metabolic Panel: Recent Labs  Lab 06/06/21 0601 06/07/21 0430 06/07/21 2211 06/08/21 0415  NA 132* 135 135 134*  K 3.0* 2.9* 3.5 3.4*  CL 91* 100 101 104  CO2 28 24 25 23   GLUCOSE 133* 141* 155* 152*  BUN 29* 11 6* 6*  CREATININE 1.39* 0.78 0.77 0.71  CALCIUM 9.6 8.5* 8.6* 8.7*  MG 1.9  --  1.3*  --    GFR: Estimated Creatinine Clearance: 48 mL/min (by C-G formula based on SCr of 0.71 mg/dL).  Liver Function Tests: Recent Labs  Lab 06/06/21 0601 06/08/21 0415  AST 34 20  ALT 17 12  ALKPHOS 76 60  BILITOT 0.4 0.2*  PROT 7.8 5.8*  ALBUMIN 4.5 3.2*   No results for input(s): LIPASE, AMYLASE in the last 168 hours.  Recent Labs  Lab 06/06/21 0659  AMMONIA 37*   Coagulation Profile: Recent Labs  Lab  06/06/21 0601  INR 0.9   CBG: Recent Labs  Lab 06/06/21 0551 06/07/21 0917 06/07/21 1504 06/08/21 0346 06/08/21 1112  GLUCAP 131* 134* 134* 148* 122*   Urine analysis:    Component Value Date/Time   COLORURINE YELLOW 06/06/2021 0601   APPEARANCEUR CLEAR 06/06/2021 0601   APPEARANCEUR Cloudy (A) 02/23/2021 1608   LABSPEC 1.005 06/06/2021 0601   PHURINE 7.0 06/06/2021 0601   GLUCOSEU NEGATIVE 06/06/2021 0601   HGBUR SMALL (A) 06/06/2021 0601   BILIRUBINUR NEGATIVE 06/06/2021 0601   BILIRUBINUR Negative 02/23/2021 1608   KETONESUR NEGATIVE 06/06/2021 0601   PROTEINUR NEGATIVE 06/06/2021 0601   UROBILINOGEN negative 07/01/2015 1541   UROBILINOGEN 0.2 10/07/2012 1928   NITRITE NEGATIVE 06/06/2021 0601   LEUKOCYTESUR MODERATE (A) 06/06/2021 0601   Sepsis Labs:  Recent Results (from the past 240 hour(s))  Urine Culture     Status: Abnormal   Collection Time: 06/06/21  6:10 AM   Specimen: Urine, Clean Catch  Result Value Ref Range Status   Specimen Description   Final    URINE, CLEAN CATCH Performed at [facility], [address], [place], [place] [address]    Special Requests   Final    NONE Performed at [facility], [address] [place], [place] [address]    Culture >=100,000 COLONIES/mL [facility] MORGANII (A)  Final   Report Status 06/09/2021 FINAL  Final   Organism ID, Bacteria MORGANELLA MORGANII (A)  Final      Susceptibility   Morganella morganii - MIC*    AMPICILLIN >=32 RESISTANT Resistant     CEFAZOLIN >=64 RESISTANT Resistant     CIPROFLOXACIN 1 RESISTANT Resistant     GENTAMICIN 8 INTERMEDIATE Intermediate     IMIPENEM 2 SENSITIVE Sensitive     NITROFURANTOIN 128 RESISTANT Resistant     TRIMETH/SULFA >=320 RESISTANT Resistant     AMPICILLIN/SULBACTAM >=32 RESISTANT Resistant     PIP/TAZO <=4 SENSITIVE Sensitive     * >=100,000 COLONIES/mL MORGANELLA MORGANII  Resp Panel by RT-PCR (Flu A&B, Covid) Nasopharyngeal Swab     Status: None   Collection  Time: 06/06/21  8:16 AM   Specimen: Nasopharyngeal Swab; Nasopharyngeal(NP) swabs in vial transport medium  Result Value Ref Range Status   SARS Coronavirus 2 by RT PCR NEGATIVE  NEGATIVE Final    Comment: (NOTE) SARS-CoV-2 target nucleic acids are NOT DETECTED.  The SARS-CoV-2 RNA is generally detectable in upper respiratory specimens during the acute phase of infection. The lowest concentration of SARS-CoV-2 viral copies this assay can detect is 138 copies/mL. A negative result does not preclude SARS-Cov-2 infection and should not be used as the sole basis for treatment or other patient management decisions. A negative result may occur with  improper specimen collection/handling, submission of specimen other than nasopharyngeal swab, presence of viral mutation(s) within the areas targeted by this assay, and inadequate number of viral copies(<138 copies/mL). A negative result must be combined with clinical observations, patient history, and epidemiological information. The expected result is Negative.  Fact Sheet for Patients:  EntrepreneurPulse.com.au  Fact Sheet for Healthcare Providers:  IncredibleEmployment.be  This test is no t yet approved or cleared by the [place] FDA and  has been authorized for detection and/or diagnosis of SARS-CoV-2 by FDA under an Emergency Use Authorization (EUA). This EUA will remain  in effect (meaning this test can be used) for the duration of the COVID-19 declaration under Section 564(b)(1) of the Act, 21 U.S.C.section 360bbb-3(b)(1), unless the authorization is terminated  or revoked sooner.       Influenza A by PCR NEGATIVE NEGATIVE Final   Influenza B by PCR NEGATIVE NEGATIVE Final    Comment: (NOTE) The Xpert Xpress SARS-CoV-2/FLU/RSV plus assay is intended as an aid in the diagnosis of influenza from Nasopharyngeal swab specimens and should not be used as a sole basis for treatment. Nasal washings  and aspirates are unacceptable for Xpert Xpress SARS-CoV-2/FLU/RSV testing.  Fact Sheet for Patients: EntrepreneurPulse.com.au  Fact Sheet for Healthcare Providers: IncredibleEmployment.be  This test is not yet approved or cleared by the [place] FDA and has been authorized for detection and/or diagnosis of SARS-CoV-2 by FDA under an Emergency Use Authorization (EUA). This EUA will remain in effect (meaning this test can be used) for the duration of the COVID-19 declaration under Section 564(b)(1) of the Act, 21 U.S.C. section 360bbb-3(b)(1), unless the authorization is terminated or revoked.  Performed at [facility], [address], [place], [place] [address]   MRSA Next Gen by PCR, Nasal     Status: None   Collection Time: 06/06/21 11:20 AM   Specimen: Nasal Mucosa; Nasal Swab  Result Value Ref Range Status   MRSA by PCR Next Gen NOT DETECTED NOT DETECTED Final    Comment: (NOTE) The GeneXpert MRSA Assay (FDA approved for NASAL specimens only), is one component of a comprehensive MRSA colonization surveillance program. It is not intended to diagnose MRSA infection nor to guide or monitor treatment for MRSA infections. Test performance is not FDA approved in patients less than 4 years old. Performed at [facility], [address], [place], [place] [address]       Radiology Studies: No results found.   Scheduled Meds:  amLODipine  5 mg Oral Daily   ferrous sulfate  325 mg Oral Q breakfast   folic acid  1 mg Oral Daily   heparin  5,000 Units Subcutaneous Q8H   influenza vaccine adjuvanted  0.5 mL Intramuscular Tomorrow-1000   levothyroxine  50 mcg Oral QAC breakfast   magnesium oxide  400 mg Oral Daily   mometasone-formoterol  2 puff Inhalation BID   multivitamin with minerals  1 tablet Oral Daily   sertraline  50 mg Oral QHS   sodium chloride flush  3 mL Intravenous Q12H  sodium chloride flush  3 mL Intravenous Q12H    tamsulosin  0.4 mg Oral QPC supper   thiamine  100 mg Oral Daily   Or   thiamine  100 mg Intravenous Daily   vitamin B-12  1,000 mcg Oral Daily   Vitamin D (Ergocalciferol)  50,000 Units Oral Q7 days   Continuous Infusions:  sodium chloride     dextrose 5 % and 0.45% NaCl 125 mL/hr at 06/11/21 1348   meropenem (MERREM) IV 1 g (06/12/21 0825)     LOS: 6 days    Barton Dubois M.D on 06/12/2021 at 2:56 PM  Go to [email or web address] - for contact info  [facility] - Office  [phone]  If 7PM-7AM, please contact night-coverage [email or web address] Password [email or web address] 06/12/2021, 2:56 PM

## 2021-06-12 NOTE — Progress Notes (Signed)
Ambulated at steady pace in hallway pushing IV pole with no assistance, just standby.  Walked to end of each hall and to Hydrograph[employer] surveyor.

## 2021-06-13 DIAGNOSIS — R4182 Altered mental status, unspecified: Secondary | ICD-10-CM | POA: Diagnosis not present

## 2021-06-13 DIAGNOSIS — F1020 Alcohol dependence, uncomplicated: Secondary | ICD-10-CM | POA: Diagnosis not present

## 2021-06-13 DIAGNOSIS — F1010 Alcohol abuse, uncomplicated: Secondary | ICD-10-CM

## 2021-06-13 DIAGNOSIS — N179 Acute kidney failure, unspecified: Secondary | ICD-10-CM | POA: Diagnosis not present

## 2021-06-13 DIAGNOSIS — N3000 Acute cystitis without hematuria: Secondary | ICD-10-CM

## 2021-06-13 DIAGNOSIS — R5381 Other malaise: Secondary | ICD-10-CM

## 2021-06-13 DIAGNOSIS — F10931 Alcohol use, unspecified with withdrawal delirium: Secondary | ICD-10-CM | POA: Diagnosis not present

## 2021-06-13 DIAGNOSIS — G894 Chronic pain syndrome: Secondary | ICD-10-CM

## 2021-06-13 DIAGNOSIS — K219 Gastro-esophageal reflux disease without esophagitis: Secondary | ICD-10-CM

## 2021-06-13 MED ORDER — TEMAZEPAM 15 MG PO CAPS: 15.0000 mg | ORAL_CAPSULE | Freq: Every evening | ORAL | 0 refills | Status: DC | PRN

## 2021-06-13 MED ORDER — METOPROLOL TARTRATE 25 MG PO TABS: 12.5000 mg | ORAL_TABLET | Freq: Two times a day (BID) | ORAL | 1 refills | Status: DC

## 2021-06-13 MED ORDER — SERTRALINE HCL 50 MG PO TABS: 50.0000 mg | ORAL_TABLET | Freq: Every day | ORAL | 1 refills | Status: DC

## 2021-06-13 MED ORDER — SODIUM CHLORIDE 0.9 % IV SOLN
1.0000 g | Freq: Once | INTRAVENOUS | Status: AC
  Administered 2021-06-13: 1 g via INTRAVENOUS
  Filled 2021-06-13: qty 1

## 2021-06-13 MED ORDER — PANTOPRAZOLE SODIUM 40 MG PO TBEC: 40.0000 mg | DELAYED_RELEASE_TABLET | Freq: Every day | ORAL | 1 refills | Status: DC

## 2021-06-13 MED ORDER — TRAMADOL HCL 50 MG PO TABS: 100.0000 mg | ORAL_TABLET | Freq: Four times a day (QID) | ORAL | 0 refills | Status: DC | PRN

## 2021-06-13 NOTE — Care Management Important Message (Signed)
Important Message  Patient Details  Name: Stacy Dalton MRN: [id number] Date of Birth: 1951-02-10   Medicare Important Message Given:  Yes     Tommy Medal 06/13/2021, 3:21 PM

## 2021-06-13 NOTE — Discharge Summary (Signed)
Physician Discharge Summary  Stacy Dalton QZR:[id number] DOB: 1951-01-02 DOA: 06/06/2021  PCP: [name], FNP  Admit date: 06/06/2021 Discharge date: 06/13/2021  Time spent: 35 minutes  Recommendations for Outpatient Follow-up:  Repeat basic metabolic panel to follow electrolytes and renal function. Continue assisting patient with alcohol cessation Further refill analgesic medications as required Reassess complete resolution of symptoms from UTI and if needed make referral for patient to see urology service (she had prior history of recurring infections and urinary retention).   Discharge Diagnoses:  Principal Problem:   DTs (delirium tremens) ([place]) Active Problems:   COPD (chronic obstructive pulmonary disease) (H[facility])   Hypothyroidism   Altered mental status   Alcohol abuse   Alcohol use disorder, severe, dependence ([place])   AKI (acute kidney injury) ([place])   Acute cystitis without hematuria   Discharge Condition: Stable and improved.  Discharged home with intractable follow-up with PCP in the next 10 days  CODE STATUS: Full code.  Diet recommendation: heart healthy diet   Filed Weights   06/06/21 0555 06/06/21 1120 06/07/21 0600  Weight: 50.8 kg 56.5 kg 52.5 kg    History of present illness:  As per H&P written by Dr. Denton Brick on 08/07/2020 Stacy Dalton  is a 70 y.o. female with significant for severe alcohol dependence, COPD, hypertension, hypothyroidism, chronic pain, depression, and anxiety presented to the ER by EMS after recurrent falls at home, EMS was concerned about possible seizures with confusion possible LOS -Patient apparently quit drinking alcohol on 05/30/2021-- -She had become increasingly more confused over the last few days -No Vomiting noted no fevers noted -Patient female neighbor is at bedside -CT head, CT C-spine and MRI brain without acute finding -Patient unable to give any relevant history at this time -Despite multiple doses of IV  lorazepam patient remains confused and agitated and required transition to IV Precedex drip -Ammonia is 37 BAL < 10 -CBC is essentially WNL -Potassium is low at 3.0, sodium is 132 chloride is 91, UDS negative -UA suspicious for UTI  Hospital Course:  1)DTs/metabolic encephalopathy.- -CT head, CT C-spine and MRI brain without acute finding -Overall doing significantly better; off Precedex for over 48 hours and currently without acute withdrawal symptoms. -Continue thiamine and folic acid. -Cessation counseling provided.   -Mentation back to baseline.   2) hypokalemia--- -magnesium WNL -DEXA metabolic panel and magnesium level at follow-up visit to reassess electrolytes stability and trend.   3) acute urinary retention/Morganella UTI -UA suspicious for UTI -Culture results demonstrating multi-drug resistant microorganism -Continue meropenem; ID curbside with recommendations to treat for 5 days. Currently Stacy 5/5 -Patient hemodynamically stable. -Last antibiotic Stacy will be 06/13/2021. -Continue to maintain adequate hydration. -No fever, normal WBCs. -Patient reports no dysuria or discharge.   4) hypothyroidism -continue levothyroxine   5)HTN -Overall stable and fairly well controlled -Continue amlodipine and resume metoprolol.[name]   6) COPD/tobacco abuse -Patient reports no shortness of breath and is currently no wheezing -Cessation counseling provided -Continue as needed bronchodilators.   7)AKI -In the setting of dehydration, UTI and urinary retention -Foley catheter in place -IV fluids has been provided; after adequate fluid resuscitation renal function back to normal. -Continue to follow trend intermittently. -Continue avoiding/minimizing the use of nephrotoxic agents.  -Successful voiding trial with patient is demonstrating ability to urinate by herself. -Continue treatment for UTI.   8) chronic pain syndrome -resume home dose of As needed tramadol (dose adjusted  for better symptom management). -Continue as needed Tylenol.   9)  insomnia -Continue as needed Restoril as ordered.   10) depression/anxiety -Resume the use of Zoloft -Patient denies suicidal ideation or hallucinations.  11) physical deconditioning -Patient discharged home with home health services to assist with transition of care and rehab.    Procedures: See below for x-ray reports.  Consultations: None  Discharge Exam: Vitals:   06/13/21 0437 06/13/21 1234  BP: (!) 145/75 (!) 148/62  Pulse: 70 82  Resp: 17 20  Temp: 98.3 F (36.8 C) 98 F (36.7 C)  SpO2: 96% 96%   General exam: Alert, awake, oriented x 3; reporting good night rest and feeling better. No CP, no nausea, no vomtiing, no dysuria.  Patient is stable for discharge. Respiratory system: Clear to auscultation. Respiratory effort normal. No using accessory muscles, no requiring O2 supplementation. Cardiovascular system:RRR. No murmurs, rubs, gallops. Gastrointestinal system: Abdomen is nondistended, soft and nontender. No organomegaly or masses felt. Normal bowel sounds heard. Central nervous system: Alert and oriented. No focal neurological deficits. Extremities: No C/C/E, +pedal pulses Skin: No rashes, lesions or ulcers Psychiatry: Judgement and insight appear normal. Mood & affect appropriate.   Discharge Instructions   Discharge Instructions     Discharge instructions   Complete by: As directed    Take medications as prescribed Arrange follow-up with PCP in 10 days Maintain adequate hydration Please stop alcohol consumption Follow heart healthy diet   Increase activity slowly   Complete by: As directed       Allergies as of 06/13/2021       Reactions   Librium [chlordiazepoxide]    Became unresponsive   Toradol [ketorolac Tromethamine] Shortness Of Breath   Butalbital-apap-caffeine Other (See Comments)   jittery   Demerol Swelling   Esgic [butalbital-apap-caffeine] Other (See Comments)    jittery   Iodine Other (See Comments)   bp bottomed out per pt several hours later; unsure if pre medicated in past with cm; done in [place]        Medication List     STOP taking these medications    famotidine 20 MG tablet Commonly known as: PEPCID   ferrous sulfate 325 (65 FE) MG tablet   LORazepam 1 MG tablet Commonly known as: Ativan   ondansetron 4 MG tablet Commonly known as: Zofran       TAKE these medications    amLODipine 5 MG tablet Commonly known as: NORVASC Take 1 tablet (5 mg total) by mouth daily.   fluticasone-salmeterol 250-50 MCG/ACT Aepb Commonly known as: Advair Diskus Inhale 1 puff into the lungs in the morning and at bedtime.   folic acid 1 MG tablet Commonly known as: FOLVITE Take 1 tablet (1 mg total) by mouth daily.   levothyroxine 50 MCG tablet Commonly known as: Synthroid Take 1 tablet (50 mcg total) by mouth daily before breakfast.   Magnesium Oxide 400 MG Caps Take 1 capsule (400 mg total) by mouth daily.   metoprolol tartrate 25 MG tablet Commonly known as: LOPRESSOR Take 0.5 tablets (12.5 mg total) by mouth 2 (two) times daily. What changed: how much to take   multivitamin with minerals Tabs tablet Take 1 tablet by mouth daily.   pantoprazole 40 MG tablet Commonly known as: Protonix Take 1 tablet (40 mg total) by mouth daily.   sertraline 50 MG tablet Commonly known as: ZOLOFT Take 1 tablet (50 mg total) by mouth at bedtime.   temazepam 15 MG capsule Commonly known as: RESTORIL Take 1 capsule (15 mg total) by mouth at bedtime  as needed for sleep.   thiamine 100 MG tablet Take 1 tablet (100 mg total) by mouth daily.   traMADol 50 MG tablet Commonly known as: ULTRAM Take 2 tablets (100 mg total) by mouth every 6 (six) hours as needed for severe pain. for pain What changed:  how much to take reasons to take this   vitamin B-12 1000 MCG tablet Commonly known as: CYANOCOBALAMIN Take 1 tablet (1,000 mcg total) by  mouth daily.   Vitamin D (Ergocalciferol) 1.25 MG (50000 UNIT) Caps capsule Commonly known as: DRISDOL Take 1 capsule (50,000 Units total) by mouth every 7 (seven) days.       Allergies  Allergen Reactions   Librium [Chlordiazepoxide]     Became unresponsive   Toradol [Ketorolac Tromethamine] Shortness Of Breath   Butalbital-Apap-Caffeine Other (See Comments)    jittery   Demerol Swelling   Esgic [Butalbital-Apap-Caffeine] Other (See Comments)    jittery   Iodine Other (See Comments)    bp bottomed out per pt several hours later; unsure if pre medicated in past with cm; done in [place], [facility] Follow up.   Specialty: [place] Why: [facility] staff will call you to schedule in home physical therapy visits.        [name], FNP. Schedule an appointment as soon as possible for a visit in 10 Stacy(s).   Specialty: Family Medicine Contact information: [place] [address] [phone]                 The results of significant diagnostics from this hospitalization (including imaging, microbiology, ancillary and laboratory) are listed below for reference.    Significant Diagnostic Studies: CT Head Wo Contrast  Result Date: 06/06/2021 CLINICAL DATA:  Head trauma, minor (Age >= 65y); Neck trauma (Age >= 65y) EXAM: CT HEAD WITHOUT CONTRAST CT CERVICAL SPINE WITHOUT CONTRAST TECHNIQUE: Multidetector CT imaging of the head and cervical spine was performed following the standard protocol without intravenous contrast. Multiplanar CT image reconstructions of the cervical spine were also generated. COMPARISON:  April 14, 2021. FINDINGS: CT HEAD FINDINGS Brain: No evidence of acute large vascular territory infarction, hemorrhage, hydrocephalus, extra-axial collection or mass lesion/mass effect. Similar atrophy and chronic microvascular ischemic disease. Vascular: No hyperdense  vessel identified. Calcific intracranial atherosclerosis Skull: No acute fracture. Sinuses/Orbits: No acute finding. Other: No mastoid effusions. CT CERVICAL SPINE FINDINGS Similar alignment. Similar reversal of normal cervical lordosis. Similar mild retrolisthesis of C5 on C6, favor degenerative etiology given marked degenerative change at this level. Rotation of C1 on C2. Mild rotatory levocurvature. Skull base and vertebrae: No evidence of acute fracture. Vertebral body heights are maintained. Soft tissues and spinal canal: No prevertebral fluid or swelling. No visible canal hematoma. Disc levels: Similar degenerative change, greatest at C5-C6 where there is disc height loss, endplate sclerosis, and posterior disc/osteophyte complex. Upper chest: Visualized lung apices are clear on motion limited evaluation. IMPRESSION: CT head: No evidence of acute intracranial abnormality. CT cervical spine : 1. No evidence of acute fracture. 2. Rotation of C1 on C2, likely positional in the absence of a fixed torticollis. 3. Similar C5-C6 degenerative change. Electronically Signed   By: Margaretha Sheffield M.D.   On: 06/06/2021 06:24   CT Cervical Spine Wo Contrast  Result Date: 06/06/2021 CLINICAL DATA:  Head trauma, minor (Age >= 65y); Neck trauma (Age >= 65y) EXAM: CT HEAD WITHOUT CONTRAST  CT CERVICAL SPINE WITHOUT CONTRAST TECHNIQUE: Multidetector CT imaging of the head and cervical spine was performed following the standard protocol without intravenous contrast. Multiplanar CT image reconstructions of the cervical spine were also generated. COMPARISON:  April 14, 2021. FINDINGS: CT HEAD FINDINGS Brain: No evidence of acute large vascular territory infarction, hemorrhage, hydrocephalus, extra-axial collection or mass lesion/mass effect. Similar atrophy and chronic microvascular ischemic disease. Vascular: No hyperdense vessel identified. Calcific intracranial atherosclerosis Skull: No acute fracture. Sinuses/Orbits: No  acute finding. Other: No mastoid effusions. CT CERVICAL SPINE FINDINGS Similar alignment. Similar reversal of normal cervical lordosis. Similar mild retrolisthesis of C5 on C6, favor degenerative etiology given marked degenerative change at this level. Rotation of C1 on C2. Mild rotatory levocurvature. Skull base and vertebrae: No evidence of acute fracture. Vertebral body heights are maintained. Soft tissues and spinal canal: No prevertebral fluid or swelling. No visible canal hematoma. Disc levels: Similar degenerative change, greatest at C5-C6 where there is disc height loss, endplate sclerosis, and posterior disc/osteophyte complex. Upper chest: Visualized lung apices are clear on motion limited evaluation. IMPRESSION: CT head: No evidence of acute intracranial abnormality. CT cervical spine : 1. No evidence of acute fracture. 2. Rotation of C1 on C2, likely positional in the absence of a fixed torticollis. 3. Similar C5-C6 degenerative change. Electronically Signed   By: Margaretha Sheffield M.D.   On: 06/06/2021 06:24   MR BRAIN WO CONTRAST  Result Date: 06/06/2021 CLINICAL DATA:  Altered mental status, combative EXAM: MRI HEAD WITHOUT CONTRAST TECHNIQUE: Multiplanar, multiecho pulse sequences of the brain and surrounding structures were obtained without intravenous contrast. COMPARISON:  CT head obtained earlier the same Stacy, MR head 11/24/2020 FINDINGS: The patient was combative and altered, and the exam could not be completed. Sagittal T1, axial T2, and axial and coronal diffusion-weighted images were obtained. Additionally, the sagittal T1 sequence is markedly motion degraded. Brain: There is no diffusion signal abnormality to suggest acute infarct. There is no definite evidence of intracranial hemorrhage or extra-axial fluid collection on the sequences provided. Parenchymal signal is normal on the T2 sequence. There is no midline shift. Vascular: The flow voids are grossly present but not well evaluated.  Skull and upper cervical spine: Marrow is not well evaluated. Sinuses/Orbits: No definite acute finding. Other: None. IMPRESSION: 1. Incomplete and motion degraded study as above. 2. No diffusion-weighted signal abnormality to suggest acute infarct. No definite parenchymal signal abnormality on the axial T2 sequence provided. 3. Consider repeat study when the patient can tolerate completing the exam. Electronically Signed   By: Valetta Mole M.D.   On: 06/06/2021 09:16    Microbiology: Recent Results (from the past 240 hour(s))  Urine Culture     Status: Abnormal   Collection Time: 06/06/21  6:10 AM   Specimen: Urine, Clean Catch  Result Value Ref Range Status   Specimen Description   Final    URINE, CLEAN CATCH Performed at [facility], [address], [place], [place] [address]    Special Requests   Final    NONE Performed at [facility], [address], [place], [place] [address]    Culture >=100,000 COLONIES/mL [facility] MORGANII (A)  Final   Report Status 06/09/2021 FINAL  Final   Organism ID, Bacteria MORGANELLA MORGANII (A)  Final      Susceptibility   Morganella morganii - MIC*    AMPICILLIN >=32 RESISTANT Resistant     CEFAZOLIN >=64 RESISTANT Resistant     CIPROFLOXACIN 1 RESISTANT Resistant  GENTAMICIN 8 INTERMEDIATE Intermediate     IMIPENEM 2 SENSITIVE Sensitive     NITROFURANTOIN 128 RESISTANT Resistant     TRIMETH/SULFA >=320 RESISTANT Resistant     AMPICILLIN/SULBACTAM >=32 RESISTANT Resistant     PIP/TAZO <=4 SENSITIVE Sensitive     * >=100,000 COLONIES/mL MORGANELLA MORGANII  Resp Panel by RT-PCR (Flu A&B, Covid) Nasopharyngeal Swab     Status: None   Collection Time: 06/06/21  8:16 AM   Specimen: Nasopharyngeal Swab; Nasopharyngeal(NP) swabs in vial transport medium  Result Value Ref Range Status   SARS Coronavirus 2 by RT PCR NEGATIVE NEGATIVE Final    Comment: (NOTE) SARS-CoV-2 target nucleic acids are NOT DETECTED.  The SARS-CoV-2 RNA is generally  detectable in upper respiratory specimens during the acute phase of infection. The lowest concentration of SARS-CoV-2 viral copies this assay can detect is 138 copies/mL. A negative result does not preclude SARS-Cov-2 infection and should not be used as the sole basis for treatment or other patient management decisions. A negative result may occur with  improper specimen collection/handling, submission of specimen other than nasopharyngeal swab, presence of viral mutation(s) within the areas targeted by this assay, and inadequate number of viral copies(<138 copies/mL). A negative result must be combined with clinical observations, patient history, and epidemiological information. The expected result is Negative.  Fact Sheet for Patients:  EntrepreneurPulse.com.au  Fact Sheet for Healthcare Providers:  IncredibleEmployment.be  This test is no t yet approved or cleared by the [place] FDA and  has been authorized for detection and/or diagnosis of SARS-CoV-2 by FDA under an Emergency Use Authorization (EUA). This EUA will remain  in effect (meaning this test can be used) for the duration of the COVID-19 declaration under Section 564(b)(1) of the Act, 21 U.S.C.section 360bbb-3(b)(1), unless the authorization is terminated  or revoked sooner.       Influenza A by PCR NEGATIVE NEGATIVE Final   Influenza B by PCR NEGATIVE NEGATIVE Final    Comment: (NOTE) The Xpert Xpress SARS-CoV-2/FLU/RSV plus assay is intended as an aid in the diagnosis of influenza from Nasopharyngeal swab specimens and should not be used as a sole basis for treatment. Nasal washings and aspirates are unacceptable for Xpert Xpress SARS-CoV-2/FLU/RSV testing.  Fact Sheet for Patients: EntrepreneurPulse.com.au  Fact Sheet for Healthcare Providers: IncredibleEmployment.be  This test is not yet approved or cleared by the [place] FDA  and has been authorized for detection and/or diagnosis of SARS-CoV-2 by FDA under an Emergency Use Authorization (EUA). This EUA will remain in effect (meaning this test can be used) for the duration of the COVID-19 declaration under Section 564(b)(1) of the Act, 21 U.S.C. section 360bbb-3(b)(1), unless the authorization is terminated or revoked.  Performed at [facility], [address] [place], [place] [address]   MRSA Next Gen by PCR, Nasal     Status: None   Collection Time: 06/06/21 11:20 AM   Specimen: Nasal Mucosa; Nasal Swab  Result Value Ref Range Status   MRSA by PCR Next Gen NOT DETECTED NOT DETECTED Final    Comment: (NOTE) The GeneXpert MRSA Assay (FDA approved for NASAL specimens only), is one component of a comprehensive MRSA colonization surveillance program. It is not intended to diagnose MRSA infection nor to guide or monitor treatment for MRSA infections. Test performance is not FDA approved in patients less than 37 years old. Performed at [facility], [address] [place], [place] [address]      Labs: Basic Metabolic Panel: Recent Labs  Lab 06/07/21 0430 06/07/21 2211 06/08/21 0415  NA 135 135 134*  K 2.9* 3.5 3.4*  CL 100 101 104  CO2 24 25 23   GLUCOSE 141* 155* 152*  BUN 11 6* 6*  CREATININE 0.78 0.77 0.71  CALCIUM 8.5* 8.6* 8.7*  MG  --  1.3*  --    Liver Function Tests: Recent Labs  Lab 06/08/21 0415  AST 20  ALT 12  ALKPHOS 60  BILITOT 0.2*  PROT 5.8*  ALBUMIN 3.2*   CBC: Recent Labs  Lab 06/07/21 0430 06/08/21 0415  WBC 6.9 7.1  HGB 10.4* 10.5*  HCT 33.5* 33.3*  MCV 89.3 88.1  PLT 224 203    BNP (last 3 results) Recent Labs    04/14/21 0842  BNP 22.0    CBG: Recent Labs  Lab 06/07/21 0917 06/07/21 1504 06/08/21 0346 06/08/21 1112  GLUCAP 134* 134* 148* 122*     Signed:  Barton Dubois MD.  [facility] 06/13/2021, 2:38 PM

## 2021-06-13 NOTE — TOC Transition Note (Signed)
Transition of Care [facility]) - CM/SW Discharge Note   Patient Details  Name: Stacy Dalton MRN: [id number] Date of Birth: 1950-07-08  Transition of Care [facility]) CM/SW Contact:  Shade Flood, LCSW Phone Number: 06/13/2021, 12:51 PM   Clinical Narrative:     Pt stable for dc today per MD. Plan remains for dc home with HHPT. Updated Linda at [place]. There are no other TOC needs for dc.  Final next level of care: [place] Barriers to Discharge: Barriers Resolved   Patient Goals and CMS Choice Patient states their goals for this hospitalization and ongoing recovery are:: return home   Choice offered to / list presented to : Patient  Discharge Placement                       Discharge Plan and Services In-house Referral: Clinical Social Work   Post Acute Care Choice: Resumption of Svcs/PTA Provider          DME Arranged: N/A         HH Arranged: PT [place] Agency: [place] ([place]) Date HH Agency Contacted: 06/08/21 Time [place]: 0677 Representative spoke with at [place]: [place] ([place]) Interventions     Readmission Risk Interventions Readmission Risk Prevention Plan 06/08/2021 04/19/2021 04/15/2021  Transportation Screening Complete - Complete  Medication Review Press photographer) Complete - Complete  PCP or Specialist appointment within 3-5 days of discharge - Complete -  [place] or Home Care Consult Complete - Complete  SW Recovery Care/Counseling Consult Complete - Complete  Palliative Care Screening Not Applicable - Not Applicable  Skilled Nursing Facility Not Applicable - Complete  Some recent data might be hidden

## 2021-06-15 ENCOUNTER — Telehealth: Payer: Self-pay | Admitting: Family

## 2021-06-15 NOTE — Telephone Encounter (Signed)
Ok

## 2021-06-22 ENCOUNTER — Emergency Department (HOSPITAL_COMMUNITY)
Admission: EM | Admit: 2021-06-22 | Discharge: 2021-06-23 | Disposition: A | Discharge status: Home / Self Care | Payer: Medicare Other | Attending: Emergency Medicine | Admitting: Emergency Medicine

## 2021-06-22 ENCOUNTER — Emergency Department (HOSPITAL_COMMUNITY): Payer: Medicare Other

## 2021-06-22 ENCOUNTER — Encounter (HOSPITAL_COMMUNITY): Payer: Self-pay | Admitting: Emergency Medicine

## 2021-06-22 DIAGNOSIS — R109 Unspecified abdominal pain: Secondary | ICD-10-CM | POA: Insufficient documentation

## 2021-06-22 DIAGNOSIS — D72829 Elevated white blood cell count, unspecified: Secondary | ICD-10-CM | POA: Insufficient documentation

## 2021-06-22 DIAGNOSIS — Z87891 Personal history of nicotine dependence: Secondary | ICD-10-CM | POA: Diagnosis not present

## 2021-06-22 DIAGNOSIS — Z8616 Personal history of COVID-19: Secondary | ICD-10-CM | POA: Insufficient documentation

## 2021-06-22 DIAGNOSIS — E039 Hypothyroidism, unspecified: Secondary | ICD-10-CM | POA: Diagnosis not present

## 2021-06-22 DIAGNOSIS — I878 Other specified disorders of veins: Secondary | ICD-10-CM | POA: Diagnosis not present

## 2021-06-22 DIAGNOSIS — Z79899 Other long term (current) drug therapy: Secondary | ICD-10-CM | POA: Insufficient documentation

## 2021-06-22 DIAGNOSIS — K209 Esophagitis, unspecified without bleeding: Secondary | ICD-10-CM | POA: Diagnosis not present

## 2021-06-22 DIAGNOSIS — J441 Chronic obstructive pulmonary disease with (acute) exacerbation: Secondary | ICD-10-CM | POA: Diagnosis not present

## 2021-06-22 DIAGNOSIS — E876 Hypokalemia: Secondary | ICD-10-CM | POA: Insufficient documentation

## 2021-06-22 DIAGNOSIS — I10 Essential (primary) hypertension: Secondary | ICD-10-CM | POA: Diagnosis not present

## 2021-06-22 DIAGNOSIS — F10129 Alcohol abuse with intoxication, unspecified: Secondary | ICD-10-CM | POA: Diagnosis not present

## 2021-06-22 DIAGNOSIS — F10929 Alcohol use, unspecified with intoxication, unspecified: Secondary | ICD-10-CM

## 2021-06-22 DIAGNOSIS — Y908 Blood alcohol level of 240 mg/100 ml or more: Secondary | ICD-10-CM | POA: Insufficient documentation

## 2021-06-22 DIAGNOSIS — R1110 Vomiting, unspecified: Secondary | ICD-10-CM | POA: Diagnosis not present

## 2021-06-22 LAB — LIPASE, BLOOD: Lipase: 35 U/L (ref 11–51)

## 2021-06-22 LAB — COMPREHENSIVE METABOLIC PANEL
ALT: 19 U/L (ref 0–44)
AST: 31 U/L (ref 15–41)
Albumin: 4 g/dL (ref 3.5–5.0)
Alkaline Phosphatase: 101 U/L (ref 38–126)
Anion gap: 17 — ABNORMAL HIGH (ref 5–15)
BUN: 24 mg/dL — ABNORMAL HIGH (ref 8–23)
CO2: 18 mmol/L — ABNORMAL LOW (ref 22–32)
Calcium: 8.8 mg/dL — ABNORMAL LOW (ref 8.9–10.3)
Chloride: 107 mmol/L (ref 98–111)
Creatinine, Ser: 1.02 mg/dL — ABNORMAL HIGH (ref 0.44–1.00)
GFR, Estimated: 58 mL/min — ABNORMAL LOW (ref >60)
Glucose, Bld: 144 mg/dL — ABNORMAL HIGH (ref 70–99)
Potassium: 3.3 mmol/L — ABNORMAL LOW (ref 3.5–5.1)
Sodium: 142 mmol/L (ref 135–145)
Total Bilirubin: 0.3 mg/dL (ref 0.3–1.2)
Total Protein: 7.2 g/dL (ref 6.5–8.1)

## 2021-06-22 LAB — CBC WITH DIFFERENTIAL/PLATELET
Abs Immature Granulocytes: 0.05 10*3/uL (ref 0.00–0.07)
Basophils Absolute: 0.1 10*3/uL (ref 0.0–0.1)
Basophils Relative: 1 %
Eosinophils Absolute: 0 10*3/uL (ref 0.0–0.5)
Eosinophils Relative: 0 %
HCT: 41.7 % (ref 36.0–46.0)
Hemoglobin: 13.6 g/dL (ref 12.0–15.0)
Immature Granulocytes: 0 %
Lymphocytes Relative: 23 %
Lymphs Abs: 2.7 10*3/uL (ref 0.7–4.0)
MCH: 27.5 pg (ref 26.0–34.0)
MCHC: 32.6 g/dL (ref 30.0–36.0)
MCV: 84.2 fL (ref 80.0–100.0)
Monocytes Absolute: 0.5 10*3/uL (ref 0.1–1.0)
Monocytes Relative: 4 %
Neutro Abs: 8.3 10*3/uL — ABNORMAL HIGH (ref 1.7–7.7)
Neutrophils Relative %: 72 %
Platelets: 619 10*3/uL — ABNORMAL HIGH (ref 150–400)
RBC: 4.95 MIL/uL (ref 3.87–5.11)
RDW: 15.9 % — ABNORMAL HIGH (ref 11.5–15.5)
WBC: 11.6 10*3/uL — ABNORMAL HIGH (ref 4.0–10.5)
nRBC: 0 % (ref 0.0–0.2)

## 2021-06-22 LAB — URINALYSIS, MICROSCOPIC (REFLEX)

## 2021-06-22 LAB — URINALYSIS, ROUTINE W REFLEX MICROSCOPIC
Bilirubin Urine: NEGATIVE
Glucose, UA: NEGATIVE mg/dL
Hgb urine dipstick: NEGATIVE
Ketones, ur: NEGATIVE mg/dL
Nitrite: NEGATIVE
Protein, ur: NEGATIVE mg/dL
Specific Gravity, Urine: 1.015 (ref 1.005–1.030)
pH: 6 (ref 5.0–8.0)

## 2021-06-22 LAB — ETHANOL: Alcohol, Ethyl (B): 393 mg/dL (ref <10)

## 2021-06-22 MED ORDER — SODIUM CHLORIDE 0.9 % IV BOLUS (SEPSIS)
500.0000 mL | Freq: Once | INTRAVENOUS | Status: AC
  Administered 2021-06-22: 21:00:00 500 mL via INTRAVENOUS

## 2021-06-22 MED ORDER — LIDOCAINE-EPINEPHRINE 2 %-1:100000 IJ SOLN: 20.0000 mL | Freq: Once | INTRAMUSCULAR | Status: DC

## 2021-06-22 MED ORDER — FOLIC ACID 5 MG/ML IJ SOLN
INTRAMUSCULAR | Status: AC
  Filled 2021-06-22: qty 0.2

## 2021-06-22 MED ORDER — KCL IN DEXTROSE-NACL 30-5-0.45 MEQ/L-%-% IV SOLN
INTRAVENOUS | Status: DC
  Filled 2021-06-22 (×2): qty 1000

## 2021-06-22 MED ORDER — SODIUM CHLORIDE 0.9 % IV SOLN
1.0000 mg | Freq: Once | INTRAVENOUS | Status: AC
  Administered 2021-06-22: 23:00:00 1 mg via INTRAVENOUS
  Filled 2021-06-22: qty 0.2

## 2021-06-22 MED ORDER — FAMOTIDINE IN NACL 20-0.9 MG/50ML-% IV SOLN
20.0000 mg | Freq: Once | INTRAVENOUS | Status: AC
  Administered 2021-06-22: 21:00:00 20 mg via INTRAVENOUS
  Filled 2021-06-22: qty 50

## 2021-06-22 MED ORDER — SODIUM CHLORIDE 0.9 % IV SOLN
1000.0000 mL | INTRAVENOUS | Status: DC
  Administered 2021-06-22: 21:00:00 1000 mL via INTRAVENOUS

## 2021-06-22 MED ORDER — THIAMINE HCL 100 MG/ML IJ SOLN
100.0000 mg | Freq: Once | INTRAMUSCULAR | Status: AC
  Administered 2021-06-22: 21:00:00 100 mg via INTRAVENOUS
  Filled 2021-06-22: qty 2

## 2021-06-22 NOTE — ED Triage Notes (Signed)
Pt brought in by EMS pt c/o abd pain after excessive ETOH abuse today. Pt ambulatory to ED bed.

## 2021-06-22 NOTE — ED Provider Notes (Signed)
[facility] EMERGENCY DEPARTMENT Provider Note   CSN: [id number] Arrival date & time: 06/22/21  2004     History Chief Complaint  Patient presents with   Alcohol Intoxication    Stacy Dalton is a 70 y.o. female.   Alcohol Intoxication   Patient presents to the ED stating that she drank too much alcohol today.  Patient states that she has a history of recurrent alcohol use.  She denies drinking every day but was drinking a lot today.  She cannot specify how much.  Patient states she started to have abdominal pain and vomited so she came to the ED.  Right now she states she is feeling better and is fine.  She states she does want to quit drinking and is going to stop.  She denies any fevers or chills.  No diarrhea.  Past Medical History:  Diagnosis Date   Chronic pain    COPD (chronic obstructive pulmonary disease) ([place])    told has copd, no current inhaler use   Cough    Depression    ETOH abuse    GERD (gastroesophageal reflux disease)    Hypertension    Hypothyroidism    Insomnia    Migraine    Migraine    Osteopenia    Pain management    Panic attacks    Small bowel obstruction [facility])     Patient Active Problem List   Diagnosis Date Noted   Acute cystitis without hematuria    Seizure ([place]) 06/06/2021   DTs (delirium tremens) ([place]) 06/06/2021   AKI (acute kidney injury) ([place]) 05/19/2021   Elevated LFTs 46/27/0350   Metabolic acidosis 09/38/1829   COVID-19 virus infection 04/15/2021    Class: Acute   Hypotensive episode 04/15/2021    Class: Acute   Physical deconditioning    Vitamin B12 deficiency 02/04/2021   Leukocytosis 11/23/2020   Alcohol use disorder, severe, dependence ([place]) 10/08/2020   Hyponatremia 09/17/2020   Alcohol withdrawal (HCC) 09/12/2020   Chronic bronchitis ([place]) 08/20/2020   Moderate protein malnutrition (HCC) 08/20/2020   Chronic low back pain 08/19/2020   Elevated SGOT ([facility]) 08/19/2020   Elevated troponin 08/19/2020    Increased serum lipase level 08/19/2020   Alcohol abuse 06/21/2020   Large bowel obstruction (HCC)    Small bowel obstruction ([place]) 07/20/2018   Hypomagnesemia 07/20/2018   Cough 03/14/2018   Polypharmacy 02/23/2017   Hypokalemia 93/71/6967   Toxic metabolic encephalopathy 89/38/1017   Anxiety 02/22/2017   GERD (gastroesophageal reflux disease) 02/22/2017   Altered mental status    Pain medication agreement signed 10/10/2016   Opioid dependence ([place]) 10/10/2016   Status post colostomy takedown 04/12/2016   Chronic constipation 12/03/2015   Peritonitis with abscess of intestine ([place]) 11/29/2015   COPD exacerbation ([place]) 09/20/2015   Osteopenia 06/10/2014   Vitamin D deficiency 06/10/2014   Hypothyroidism    Depression 11/18/2012   Insomnia 11/18/2012   Chronic pain syndrome 11/18/2012   HLD (hyperlipidemia) 09/01/2012   S/P colostomy ([place]) 04/19/2012   Normocytic anemia 10/15/2011   Hypertension 10/13/2011   Chronic back pain 10/13/2011   COPD (chronic obstructive pulmonary disease) ([place]) 10/09/2011    Past Surgical History:  Procedure Laterality Date   ABDOMINAL HYSTERECTOMY     COLON SURGERY     COLOSTOMY CLOSURE  04/19/2012   Procedure: COLOSTOMY CLOSURE;  Surgeon: Adin Hector, MD;  Location: [facility];  Service: General;  Laterality: N/A;  Laparotomy, Resection and Closure of Colostomy   COLOSTOMY TAKEDOWN  N/A 04/12/2016   Procedure: [place] TAKEDOWN;  Surgeon: Johnathan Hausen, MD;  Location: [facility];  Service: General;  Laterality: N/A;   INCONTINENCE SURGERY     LAPAROTOMY  10/04/2011, colostomy also   Procedure: EXPLORATORY LAPAROTOMY;  Surgeon: Adin Hector, MD;  Location: [facility];  Service: General;  Laterality: N/A;  left partial colectomy with colostomy   LAPAROTOMY  04/19/2012   Procedure: EXPLORATORY LAPAROTOMY;  Surgeon: Adin Hector, MD;  Location: [facility];  Service: General;  Laterality: N/A;   LAPAROTOMY N/A 11/29/2015   Procedure: EXPLORATORY  LAPAROTOMY; SUBTOTAL COLECTOMY WITH HARTMAN PROCEDURE AND END COLOSTOMY;  Surgeon: Johnathan Hausen, MD;  Location: [facility];  Service: General;  Laterality: N/A;   TUBAL LIGATION     VENTRAL HERNIA REPAIR  04/19/2012   Procedure: HERNIA REPAIR VENTRAL ADULT;  Surgeon: Adin Hector, MD;  Location: [facility];  Service: General;  Laterality: N/A;     OB History     Gravida      Para      Term      Preterm      AB      Living  5      SAB      IAB      Ectopic      Multiple      Live Births              Family History  Problem Relation Age of Onset   Heart disease Mother    Hypertension Mother    Cancer Sister        sinus   Diabetes Brother    Heart disease Brother    Thyroid disease Brother    Cancer Sister        abdominal ?   Thyroid disease Sister    Cancer Other        GE junction adenocarcinoma   Emphysema Father    Stroke Father    Hypertension Father    Heart disease Father    COPD Sister    Thyroid disease Sister    Thyroid disease Sister    Diabetes Brother    Thyroid disease Brother    Thyroid disease Brother    Hypertension Brother    Post-traumatic stress disorder Brother    COPD Brother    Thyroid disease Brother    Thyroid disease Brother    Hypertension Brother    Transient ischemic attack Brother     Social History   Tobacco Use   Smoking status: Former    Packs/day: 1.50    Years: 20.00    Pack years: 30.00    Types: Cigarettes    Quit date: 10/04/1998    Years since quitting: 22.7   Smokeless tobacco: Never  Vaping Use   Vaping Use: Never used  Substance Use Topics   Alcohol use: Yes    Comment: 1bottle vodka -3 days. As of 03/01/21 pt  states she has quit drinking"for several days."   Drug use: No    Home Medications Prior to Admission medications   Medication Sig Start Date End Date Taking? Authorizing Provider  amLODipine (NORVASC) 5 MG tablet Take 1 tablet (5 mg total) by mouth daily. 05/22/21   Regalado, Belkys  A, MD  fluticasone-salmeterol (ADVAIR DISKUS) 250-50 MCG/ACT AEPB Inhale 1 puff into the lungs in the morning and at bedtime. Patient not taking: Reported on 05/25/2021 04/19/21 05/20/21  Deatra James, MD  folic acid (FOLVITE) 1 MG tablet  Take 1 tablet (1 mg total) by mouth daily. Patient not taking: Reported on 05/25/2021 05/22/21   Niel Hummer A, MD  levothyroxine (SYNTHROID) 50 MCG tablet Take 1 tablet (50 mcg total) by mouth daily before breakfast. 06/22/20   Evelina Dun A, FNP  Magnesium Oxide 400 MG CAPS Take 1 capsule (400 mg total) by mouth daily. 05/24/21 06/23/21  Wyvonnia Dusky, MD  metoprolol tartrate (LOPRESSOR) 25 MG tablet Take 0.5 tablets (12.5 mg total) by mouth 2 (two) times daily. 06/13/21 07/13/21  Barton Dubois, MD  Multiple Vitamin (MULTIVITAMIN WITH MINERALS) TABS tablet Take 1 tablet by mouth daily. Patient not taking: Reported on 05/25/2021 05/22/21   Regalado, Jerald Kief A, MD  pantoprazole (PROTONIX) 40 MG tablet Take 1 tablet (40 mg total) by mouth daily. 06/13/21 06/13/22  Barton Dubois, MD  sertraline (ZOLOFT) 50 MG tablet Take 1 tablet (50 mg total) by mouth at bedtime. 06/13/21   Barton Dubois, MD  temazepam (RESTORIL) 15 MG capsule Take 1 capsule (15 mg total) by mouth at bedtime as needed for sleep. 06/13/21   Barton Dubois, MD  thiamine 100 MG tablet Take 1 tablet (100 mg total) by mouth daily. Patient not taking: Reported on 05/25/2021 05/22/21   Regalado, Jerald Kief A, MD  traMADol (ULTRAM) 50 MG tablet Take 2 tablets (100 mg total) by mouth every 6 (six) hours as needed for severe pain. for pain 06/13/21   Barton Dubois, MD  vitamin B-12 (CYANOCOBALAMIN) 1000 MCG tablet Take 1 tablet (1,000 mcg total) by mouth daily. Patient not taking: Reported on 05/25/2021 05/21/21   Regalado, Jerald Kief A, MD  Vitamin D, Ergocalciferol, (DRISDOL) 1.25 MG (50000 UNIT) CAPS capsule Take 1 capsule (50,000 Units total) by mouth every 7 (seven) days. Patient not  taking: Reported on 05/25/2021 06/22/20   [name], FNP    Allergies    Librium [chlordiazepoxide], Toradol [ketorolac tromethamine], Butalbital-apap-caffeine, Demerol, Esgic [butalbital-apap-caffeine], and Iodine  Review of Systems   Review of Systems  All other systems reviewed and are negative.  Physical Exam Updated Vital Signs BP 137/77    Pulse (!) 102    Temp 97.7 F (36.5 C) (Oral)    Resp 18    Ht 1.549 m (5\' 1" )    Wt 52.5 kg    SpO2 98%    BMI 21.87 kg/m   Physical Exam Vitals and nursing note reviewed.  Constitutional:      Appearance: She is well-developed. She is not diaphoretic.  HENT:     Head: Normocephalic and atraumatic.     Right Ear: External ear normal.     Left Ear: External ear normal.  Eyes:     General: No scleral icterus.       Right eye: No discharge.        Left eye: No discharge.     Conjunctiva/sclera: Conjunctivae normal.  Neck:     Trachea: No tracheal deviation.  Cardiovascular:     Rate and Rhythm: Normal rate and regular rhythm.  Pulmonary:     Effort: Pulmonary effort is normal. No respiratory distress.     Breath sounds: Normal breath sounds. No stridor. No wheezing or rales.  Abdominal:     General: Bowel sounds are normal. There is no distension.     Palpations: Abdomen is soft.     Tenderness: There is no abdominal tenderness. There is no guarding or rebound.  Musculoskeletal:        General: No tenderness or deformity.  Cervical back: Neck supple.  Skin:    General: Skin is warm and dry.     Findings: No rash.  Neurological:     General: No focal deficit present.     Mental Status: She is alert.     Cranial Nerves: No cranial nerve deficit (no facial droop, extraocular movements intact, no slurred speech).     Sensory: No sensory deficit.     Motor: No abnormal muscle tone or seizure activity.     Coordination: Coordination normal.     Comments: Following commands, answering questions appropriately   Psychiatric:     Comments: At times tearful    ED Results / Procedures / Treatments   Labs (all labs ordered are listed, but only abnormal results are displayed) Labs Reviewed  COMPREHENSIVE METABOLIC PANEL - Abnormal; Notable for the following components:      Result Value   Potassium 3.3 (*)    CO2 18 (*)    Glucose, Bld 144 (*)    BUN 24 (*)    Creatinine, Ser 1.02 (*)    Calcium 8.8 (*)    GFR, Estimated 58 (*)    Anion gap 17 (*)    All other components within normal limits  CBC WITH DIFFERENTIAL/PLATELET - Abnormal; Notable for the following components:   WBC 11.6 (*)    RDW 15.9 (*)    Platelets 619 (*)    Neutro Abs 8.3 (*)    All other components within normal limits  ETHANOL - Abnormal; Notable for the following components:   Alcohol, Ethyl (B) 393 (*)    All other components within normal limits  LIPASE, BLOOD  URINALYSIS, ROUTINE W REFLEX MICROSCOPIC    EKG None  Radiology DG Abdomen Acute W/Chest  Result Date: 06/22/2021 CLINICAL DATA:  Abdominal pain and vomiting. EXAM: DG ABDOMEN ACUTE WITH 1 VIEW CHEST COMPARISON:  August 19, 2020 FINDINGS: There is no evidence of dilated bowel loops or free intraperitoneal air. No radiopaque calculi or other significant radiographic abnormality is seen. Subcentimeter phleboliths are noted within the lower pelvis. Heart size and mediastinal contours are within normal limits. Both lungs are clear. IMPRESSION: Negative abdominal radiographs.  No acute cardiopulmonary disease. Electronically Signed   By: Virgina Norfolk M.D.   On: 06/22/2021 21:32    Procedures Procedures   Medications Ordered in ED Medications  sodium chloride 0.9 % bolus 500 mL (0 mLs Intravenous Stopped 06/22/21 2129)    Followed by  0.9 %  sodium chloride infusion (1,000 mLs Intravenous New Bag/Given 06/22/21 2129)  dextrose 5 % and 0.45 % NaCl with KCl 30 mEq/L infusion (has no administration in time range)  thiamine (B-1) injection 100 mg  (100 mg Intravenous Given 25/95/63 8756)  folic acid 1 mg in sodium chloride 0.9 % 50 mL IVPB (1 mg Intravenous New Bag/Given 06/22/21 2241)  famotidine (PEPCID) IVPB 20 mg premix (0 mg Intravenous Stopped 06/22/21 2129)    ED Course  I have reviewed the triage vital signs and the nursing notes.  Pertinent labs & imaging results that were available during my care of the patient were reviewed by me and considered in my medical decision making (see chart for details).  Clinical Course as of 06/22/21 2325  Wed Jun 22, 2021  2146 CBC shows a white count of 11.6.  Metabolic panel does show bicarb of 18 BUN and creatinine elevated at 24 and 1.02 [JK]  2147 Acute abdominal series without signs of obstruction [JK]  2322 Patient's  alcohol level is significantly elevated at 393.  We will plan on monitoring her till she is more clinically sober and safe for discharge. [JK]    Clinical Course User Index [JK] Dorie Rank, MD   MDM Rules/Calculators/A&P                         Patient presented to the ED for evaluation excessive alcohol consumption.  No vomiting in the ED.  Patient's ED work-up is notable for a significantly elevated alcohol level.  At this time time no signs of alcohol withdrawal.  Patient does have slight decrease in bicarb and potassium level.  We will start her on D5 quarter normal saline with potassium.  Patient will be monitored in the ED until she is more clinically sober and appropriate for discharge.  Case turned over to Dr. Sedonia Small.    Final Clinical Impression(s) / ED Diagnoses Final diagnoses:  Alcoholic intoxication with complication ([place])  Hypokalemia     Dorie Rank, MD 06/22/21 2325

## 2021-06-23 ENCOUNTER — Emergency Department (HOSPITAL_COMMUNITY): Payer: Medicare Other

## 2021-06-23 ENCOUNTER — Emergency Department (HOSPITAL_COMMUNITY)
Admission: EM | Admit: 2021-06-23 | Discharge: 2021-06-24 | Disposition: A | Discharge status: Home / Self Care | Payer: Medicare Other | Source: Home / Self Care | Attending: Emergency Medicine | Admitting: Emergency Medicine

## 2021-06-23 ENCOUNTER — Encounter (HOSPITAL_COMMUNITY): Payer: Self-pay | Admitting: Emergency Medicine

## 2021-06-23 DIAGNOSIS — K209 Esophagitis, unspecified without bleeding: Secondary | ICD-10-CM | POA: Insufficient documentation

## 2021-06-23 DIAGNOSIS — E039 Hypothyroidism, unspecified: Secondary | ICD-10-CM | POA: Insufficient documentation

## 2021-06-23 DIAGNOSIS — Y908 Blood alcohol level of 240 mg/100 ml or more: Secondary | ICD-10-CM | POA: Insufficient documentation

## 2021-06-23 DIAGNOSIS — F1010 Alcohol abuse, uncomplicated: Secondary | ICD-10-CM

## 2021-06-23 DIAGNOSIS — R52 Pain, unspecified: Secondary | ICD-10-CM | POA: Diagnosis not present

## 2021-06-23 DIAGNOSIS — Z8616 Personal history of COVID-19: Secondary | ICD-10-CM | POA: Insufficient documentation

## 2021-06-23 DIAGNOSIS — Z87891 Personal history of nicotine dependence: Secondary | ICD-10-CM | POA: Insufficient documentation

## 2021-06-23 DIAGNOSIS — Z79899 Other long term (current) drug therapy: Secondary | ICD-10-CM | POA: Insufficient documentation

## 2021-06-23 DIAGNOSIS — R109 Unspecified abdominal pain: Secondary | ICD-10-CM | POA: Diagnosis not present

## 2021-06-23 DIAGNOSIS — J449 Chronic obstructive pulmonary disease, unspecified: Secondary | ICD-10-CM | POA: Insufficient documentation

## 2021-06-23 DIAGNOSIS — I10 Essential (primary) hypertension: Secondary | ICD-10-CM | POA: Insufficient documentation

## 2021-06-23 DIAGNOSIS — F10129 Alcohol abuse with intoxication, unspecified: Secondary | ICD-10-CM | POA: Diagnosis not present

## 2021-06-23 DIAGNOSIS — R1084 Generalized abdominal pain: Secondary | ICD-10-CM | POA: Diagnosis not present

## 2021-06-23 LAB — COMPREHENSIVE METABOLIC PANEL
ALT: 18 U/L (ref 0–44)
AST: 29 U/L (ref 15–41)
Albumin: 3.8 g/dL (ref 3.5–5.0)
Alkaline Phosphatase: 91 U/L (ref 38–126)
Anion gap: 15 (ref 5–15)
BUN: 12 mg/dL (ref 8–23)
CO2: 19 mmol/L — ABNORMAL LOW (ref 22–32)
Calcium: 8.6 mg/dL — ABNORMAL LOW (ref 8.9–10.3)
Chloride: 106 mmol/L (ref 98–111)
Creatinine, Ser: 0.76 mg/dL (ref 0.44–1.00)
GFR, Estimated: >60 mL/min (ref >60)
Glucose, Bld: 114 mg/dL — ABNORMAL HIGH (ref 70–99)
Potassium: 3.4 mmol/L — ABNORMAL LOW (ref 3.5–5.1)
Sodium: 140 mmol/L (ref 135–145)
Total Bilirubin: 0.3 mg/dL (ref 0.3–1.2)
Total Protein: 6.9 g/dL (ref 6.5–8.1)

## 2021-06-23 LAB — CBC WITH DIFFERENTIAL/PLATELET
Abs Immature Granulocytes: 0.02 10*3/uL (ref 0.00–0.07)
Basophils Absolute: 0.1 10*3/uL (ref 0.0–0.1)
Basophils Relative: 1 %
Eosinophils Absolute: 0.1 10*3/uL (ref 0.0–0.5)
Eosinophils Relative: 1 %
HCT: 37.3 % (ref 36.0–46.0)
Hemoglobin: 12.2 g/dL (ref 12.0–15.0)
Immature Granulocytes: 0 %
Lymphocytes Relative: 40 %
Lymphs Abs: 2.6 10*3/uL (ref 0.7–4.0)
MCH: 27.4 pg (ref 26.0–34.0)
MCHC: 32.7 g/dL (ref 30.0–36.0)
MCV: 83.6 fL (ref 80.0–100.0)
Monocytes Absolute: 0.4 10*3/uL (ref 0.1–1.0)
Monocytes Relative: 6 %
Neutro Abs: 3.4 10*3/uL (ref 1.7–7.7)
Neutrophils Relative %: 52 %
Platelets: 497 10*3/uL — ABNORMAL HIGH (ref 150–400)
RBC: 4.46 MIL/uL (ref 3.87–5.11)
RDW: 15.4 % (ref 11.5–15.5)
WBC: 6.5 10*3/uL (ref 4.0–10.5)
nRBC: 0 % (ref 0.0–0.2)

## 2021-06-23 LAB — LIPASE, BLOOD: Lipase: 41 U/L (ref 11–51)

## 2021-06-23 LAB — ETHANOL: Alcohol, Ethyl (B): 377 mg/dL (ref <10)

## 2021-06-23 MED ORDER — IOHEXOL 300 MG/ML  SOLN
100.0000 mL | Freq: Once | INTRAMUSCULAR | Status: AC | PRN
  Administered 2021-06-23: 23:00:00 100 mL via INTRAVENOUS

## 2021-06-23 MED ORDER — ALUM & MAG HYDROXIDE-SIMETH 200-200-20 MG/5ML PO SUSP
30.0000 mL | Freq: Once | ORAL | Status: AC
  Administered 2021-06-23: 03:00:00 30 mL via ORAL
  Filled 2021-06-23: qty 30

## 2021-06-23 MED ORDER — LORAZEPAM 2 MG/ML IJ SOLN
1.0000 mg | Freq: Once | INTRAMUSCULAR | Status: AC
  Administered 2021-06-23: 22:00:00 1 mg via INTRAVENOUS
  Filled 2021-06-23: qty 1

## 2021-06-23 MED ORDER — LIDOCAINE VISCOUS HCL 2 % MT SOLN: 15.0000 mL | Freq: Once | OROMUCOSAL | Status: DC

## 2021-06-23 MED ORDER — ALUM & MAG HYDROXIDE-SIMETH 200-200-20 MG/5ML PO SUSP: 30.0000 mL | Freq: Once | ORAL | Status: DC

## 2021-06-23 NOTE — ED Notes (Signed)
Friend is on his way to pick up pt from ED.

## 2021-06-23 NOTE — ED Triage Notes (Signed)
Pt brought in by [facility] from home after family called due to pt being severely intoxicated and c/o abd pain.   Pt seen here last night for same.

## 2021-06-23 NOTE — ED Provider Notes (Signed)
°  Provider Note MRN:  [id number]  Arrival date & time: 06/23/21    ED Course and Medical Decision Making  Assumed care from Dr. Tomi Bamberger at shift change.  Alcohol intoxication, will allow metabolization and reassess.  Patient is clinically improved, sober enough for discharge, friend is here to drive her home.  Procedures  Final Clinical Impressions(s) / ED Diagnoses     ICD-10-CM   1. Alcoholic intoxication with complication ([place])  J28.206     2. Hypokalemia  E87.6       ED Discharge Orders     None       Discharge Instructions   None     Barth Kirks. Sedonia Small, [place] [email or web address]    Maudie Flakes, MD 06/23/21 [phone]

## 2021-06-23 NOTE — Discharge Instructions (Addendum)
Your CT scan shows signs of esophagitis.  Continued alcohol intake will worsen this condition.  Follow-up with your primary care doctor and gastroenterologist within the week.  Return if you have fevers worsening symptoms vomiting or any additional concerns.

## 2021-06-23 NOTE — Discharge Instructions (Signed)
You were evaluated in the Emergency Department and after careful evaluation, we did not find any emergent condition requiring admission or further testing in the hospital.  Your exam/testing today was overall reassuring.  Please return to the Emergency Department if you experience any worsening of your condition.  Thank you for allowing us to be a part of your care.  

## 2021-06-23 NOTE — ED Provider Notes (Addendum)
[facility] EMERGENCY DEPARTMENT Provider Note   CSN: [id number] Arrival date & time: 06/23/21  1945     History Chief Complaint  Patient presents with   Alcohol Intoxication    Stacy Dalton is a 70 y.o. female.  Patient presents back to the ER chief complaint of alcohol use and abdominal pain.  Reportedly seen here last night with a negative work-up regarding blood tests and improvement of symptoms discharged home.  Returns again tonight for abdominal pain and alcohol use.  No other family present.  Patient clinically appears intoxicated unable to provide clear history.      Past Medical History:  Diagnosis Date   Chronic pain    COPD (chronic obstructive pulmonary disease) ([place])    told has copd, no current inhaler use   Cough    Depression    ETOH abuse    GERD (gastroesophageal reflux disease)    Hypertension    Hypothyroidism    Insomnia    Migraine    Migraine    Osteopenia    Pain management    Panic attacks    Small bowel obstruction [facility])     Patient Active Problem List   Diagnosis Date Noted   Acute cystitis without hematuria    Seizure ([place]) 06/06/2021   DTs (delirium tremens) ([place]) 06/06/2021   AKI (acute kidney injury) ([place]) 05/19/2021   Elevated LFTs 59/45/8592   Metabolic acidosis 92/44/6286   COVID-19 virus infection 04/15/2021    Class: Acute   Hypotensive episode 04/15/2021    Class: Acute   Physical deconditioning    Vitamin B12 deficiency 02/04/2021   Leukocytosis 11/23/2020   Alcohol use disorder, severe, dependence ([place]) 10/08/2020   Hyponatremia 09/17/2020   Alcohol withdrawal (HCC) 09/12/2020   Chronic bronchitis ([place]) 08/20/2020   Moderate protein malnutrition (HCC) 08/20/2020   Chronic low back pain 08/19/2020   Elevated SGOT ([place]) 08/19/2020   Elevated troponin 08/19/2020   Increased serum lipase level 08/19/2020   Alcohol abuse 06/21/2020   Large bowel obstruction (HCC)    Small bowel obstruction ([place]) 07/20/2018    Hypomagnesemia 07/20/2018   Cough 03/14/2018   Polypharmacy 02/23/2017   Hypokalemia 38/17/7116   Toxic metabolic encephalopathy 57/90/3833   Anxiety 02/22/2017   GERD (gastroesophageal reflux disease) 02/22/2017   Altered mental status    Pain medication agreement signed 10/10/2016   Opioid dependence ([place]) 10/10/2016   Status post colostomy takedown 04/12/2016   Chronic constipation 12/03/2015   Peritonitis with abscess of intestine ([place]) 11/29/2015   COPD exacerbation ([place]) 09/20/2015   Osteopenia 06/10/2014   Vitamin D deficiency 06/10/2014   Hypothyroidism    Depression 11/18/2012   Insomnia 11/18/2012   Chronic pain syndrome 11/18/2012   HLD (hyperlipidemia) 09/01/2012   S/P colostomy ([place]) 04/19/2012   Normocytic anemia 10/15/2011   Hypertension 10/13/2011   Chronic back pain 10/13/2011   COPD (chronic obstructive pulmonary disease) ([place]) 10/09/2011    Past Surgical History:  Procedure Laterality Date   ABDOMINAL HYSTERECTOMY     COLON SURGERY     COLOSTOMY CLOSURE  04/19/2012   Procedure: COLOSTOMY CLOSURE;  Surgeon: Adin Hector, MD;  Location: [facility] ORS;  Service: General;  Laterality: N/A;  Laparotomy, Resection and Closure of Colostomy   COLOSTOMY TAKEDOWN N/A 04/12/2016   Procedure: [place] TAKEDOWN;  Surgeon: Johnathan Hausen, MD;  Location: [facility];  Service: General;  Laterality: N/A;   INCONTINENCE SURGERY     LAPAROTOMY  10/04/2011, colostomy also   Procedure: EXPLORATORY  LAPAROTOMY;  Surgeon: Adin Hector, MD;  Location: [facility];  Service: General;  Laterality: N/A;  left partial colectomy with colostomy   LAPAROTOMY  04/19/2012   Procedure: EXPLORATORY LAPAROTOMY;  Surgeon: Adin Hector, MD;  Location: [facility];  Service: General;  Laterality: N/A;   LAPAROTOMY N/A 11/29/2015   Procedure: EXPLORATORY LAPAROTOMY; SUBTOTAL COLECTOMY WITH HARTMAN PROCEDURE AND END COLOSTOMY;  Surgeon: Johnathan Hausen, MD;  Location: [facility];  Service: General;  Laterality:  N/A;   TUBAL LIGATION     VENTRAL HERNIA REPAIR  04/19/2012   Procedure: HERNIA REPAIR VENTRAL ADULT;  Surgeon: Adin Hector, MD;  Location: [facility];  Service: General;  Laterality: N/A;     OB History     Gravida      Para      Term      Preterm      AB      Living  5      SAB      IAB      Ectopic      Multiple      Live Births              Family History  Problem Relation Age of Onset   Heart disease Mother    Hypertension Mother    Cancer Sister        sinus   Diabetes Brother    Heart disease Brother    Thyroid disease Brother    Cancer Sister        abdominal ?   Thyroid disease Sister    Cancer Other        GE junction adenocarcinoma   Emphysema Father    Stroke Father    Hypertension Father    Heart disease Father    COPD Sister    Thyroid disease Sister    Thyroid disease Sister    Diabetes Brother    Thyroid disease Brother    Thyroid disease Brother    Hypertension Brother    Post-traumatic stress disorder Brother    COPD Brother    Thyroid disease Brother    Thyroid disease Brother    Hypertension Brother    Transient ischemic attack Brother     Social History   Tobacco Use   Smoking status: Former    Packs/day: 1.50    Years: 20.00    Pack years: 30.00    Types: Cigarettes    Quit date: 10/04/1998    Years since quitting: 22.7   Smokeless tobacco: Never  Vaping Use   Vaping Use: Never used  Substance Use Topics   Alcohol use: Yes    Comment: 1bottle vodka -3 days. As of 03/01/21 pt  states she has quit drinking"for several days."   Drug use: No    Home Medications Prior to Admission medications   Medication Sig Start Date End Date Taking? Authorizing Provider  amLODipine (NORVASC) 5 MG tablet Take 1 tablet (5 mg total) by mouth daily. 05/22/21   Regalado, Belkys A, MD  fluticasone-salmeterol (ADVAIR DISKUS) 250-50 MCG/ACT AEPB Inhale 1 puff into the lungs in the morning and at bedtime. Patient not taking:  Reported on 05/25/2021 04/19/21 05/20/21  Deatra James, MD  folic acid (FOLVITE) 1 MG tablet Take 1 tablet (1 mg total) by mouth daily. Patient not taking: Reported on 05/25/2021 05/22/21   Niel Hummer A, MD  levothyroxine (SYNTHROID) 50 MCG tablet Take 1 tablet (50 mcg total) by mouth daily before breakfast.  06/22/20   Evelina Dun A, FNP  Magnesium Oxide 400 MG CAPS Take 1 capsule (400 mg total) by mouth daily. 05/24/21 06/23/21  Wyvonnia Dusky, MD  metoprolol tartrate (LOPRESSOR) 25 MG tablet Take 0.5 tablets (12.5 mg total) by mouth 2 (two) times daily. 06/13/21 07/13/21  Barton Dubois, MD  Multiple Vitamin (MULTIVITAMIN WITH MINERALS) TABS tablet Take 1 tablet by mouth daily. Patient not taking: Reported on 05/25/2021 05/22/21   Regalado, Jerald Kief A, MD  pantoprazole (PROTONIX) 40 MG tablet Take 1 tablet (40 mg total) by mouth daily. 06/13/21 06/13/22  Barton Dubois, MD  sertraline (ZOLOFT) 50 MG tablet Take 1 tablet (50 mg total) by mouth at bedtime. 06/13/21   Barton Dubois, MD  temazepam (RESTORIL) 15 MG capsule Take 1 capsule (15 mg total) by mouth at bedtime as needed for sleep. 06/13/21   Barton Dubois, MD  thiamine 100 MG tablet Take 1 tablet (100 mg total) by mouth daily. Patient not taking: Reported on 05/25/2021 05/22/21   Regalado, Jerald Kief A, MD  traMADol (ULTRAM) 50 MG tablet Take 2 tablets (100 mg total) by mouth every 6 (six) hours as needed for severe pain. for pain 06/13/21   Barton Dubois, MD  vitamin B-12 (CYANOCOBALAMIN) 1000 MCG tablet Take 1 tablet (1,000 mcg total) by mouth daily. Patient not taking: Reported on 05/25/2021 05/21/21   Regalado, Jerald Kief A, MD  Vitamin D, Ergocalciferol, (DRISDOL) 1.25 MG (50000 UNIT) CAPS capsule Take 1 capsule (50,000 Units total) by mouth every 7 (seven) days. Patient not taking: Reported on 05/25/2021 06/22/20   [name], FNP    Allergies    Librium [chlordiazepoxide], Toradol [ketorolac tromethamine],  Butalbital-apap-caffeine, Demerol, Esgic [butalbital-apap-caffeine], and Iodine  Review of Systems   Review of Systems  Unable to perform ROS: Mental status change   Physical Exam Updated Vital Signs BP 106/62 (BP Location: Left Arm)    Pulse 83    Temp 98.2 F (36.8 C) (Oral)    Resp 18    Ht 5\' 1"  (1.549 m)    Wt 52.5 kg    SpO2 98%    BMI 21.87 kg/m   Physical Exam Constitutional:      General: She is not in acute distress.    Appearance: Normal appearance.  HENT:     Head: Normocephalic.     Nose: Nose normal.  Eyes:     Extraocular Movements: Extraocular movements intact.  Cardiovascular:     Rate and Rhythm: Normal rate.  Pulmonary:     Effort: Pulmonary effort is normal.  Abdominal:     Tenderness: There is abdominal tenderness.     Comments: Diffuse mid to lower abdominal tenderness.  No guarding or rebound.  Musculoskeletal:        General: Normal range of motion.     Cervical back: Normal range of motion.  Neurological:     General: No focal deficit present.     Mental Status: She is alert. Mental status is at baseline.    ED Results / Procedures / Treatments   Labs (all labs ordered are listed, but only abnormal results are displayed) Labs Reviewed  CBC WITH DIFFERENTIAL/PLATELET - Abnormal; Notable for the following components:      Result Value   Platelets 497 (*)    All other components within normal limits  COMPREHENSIVE METABOLIC PANEL - Abnormal; Notable for the following components:   Potassium 3.4 (*)    CO2 19 (*)    Glucose, Bld 114 (*)  Calcium 8.6 (*)    All other components within normal limits  ETHANOL - Abnormal; Notable for the following components:   Alcohol, Ethyl (B) 377 (*)    All other components within normal limits  LIPASE, BLOOD    EKG EKG Interpretation  Date/Time:  Thursday June 23 2021 22:18:15 EST Ventricular Rate:  94 PR Interval:  127 QRS Duration: 83 QT Interval:  384 QTC Calculation: 481 R  Axis:   82 Text Interpretation: Sinus or ectopic atrial rhythm Borderline right axis deviation Confirmed by Thamas Jaegers ([id number]) on 06/23/2021 10:36:28 PM  Radiology CT Abdomen Pelvis W Contrast  Result Date: 06/23/2021 CLINICAL DATA:  Abdominal pain, acute, nonlocalized. chief complaint of alcohol use and abdominal pain EXAM: CT ABDOMEN AND PELVIS WITH CONTRAST TECHNIQUE: Multidetector CT imaging of the abdomen and pelvis was performed using the standard protocol following bolus administration of intravenous contrast. CONTRAST:  183mL OMNIPAQUE IOHEXOL 300 MG/ML  SOLN COMPARISON:  CT abdomen pelvis 04/08/2021, CT abdomen pelvis 07/20/2018 FINDINGS: Lower chest: Circumferential diffuse distal esophageal wall thickening and mucosal hyperemia. Query gastroesophageal junction/gastric fundus thickening with markedly limited evaluation due to motion artifact. Hepatobiliary: The liver is enlarged measuring up to 24 cm. No focal liver abnormality. No gallstones, gallbladder wall thickening, or pericholecystic fluid. No biliary dilatation. Pancreas: No focal lesion. Normal pancreatic contour. No surrounding inflammatory changes. No main pancreatic ductal dilatation. Spleen: Normal in size without focal abnormality. Adrenals/Urinary Tract: No adrenal nodule bilaterally. Bilateral kidneys enhance symmetrically. Subcentimeter hypodensities are too small to characterize. No hydroureteronephrosis.  No nephroureterolithiasis. The urinary bladder is markedly distended with urine. On delayed imaging, there is no urothelial wall thickening and there are no filling defects in the opacified portions of the bilateral collecting systems or ureters. Stomach/Bowel: Surgical changes related to bowel resections. Stomach is within normal limits. No evidence of bowel wall thickening or dilatation. Appendix appears normal. Vascular/Lymphatic: No abdominal aorta or iliac aneurysm. Severe atherosclerotic plaque of the aorta and its  branches. No abdominal, pelvic, or inguinal lymphadenopathy. Reproductive: Status post hysterectomy. No adnexal masses. Other: No intraperitoneal free fluid. No intraperitoneal free gas. No organized fluid collection. Musculoskeletal: No abdominal wall hernia or abnormality. No suspicious lytic or blastic osseous lesions. No acute displaced fracture. IMPRESSION: 1. Circumferential diffuse distal esophageal wall thickening and mucosal hyperemia. Correlate with esophagitis. Query gastroesophageal junction/gastric fundus thickening with markedly limited evaluation due to motion artifact. Underlying malignancy not excluded. 2. Distended urinary bladder lumen with associated bilateral mild hydroureteronephrosis. 3. Hepatomegaly. 4.  Aortic Atherosclerosis (ICD10-I70.0). Electronically Signed   By: Iven Finn M.D.   On: 06/23/2021 23:06   DG Abdomen Acute W/Chest  Result Date: 06/22/2021 CLINICAL DATA:  Abdominal pain and vomiting. EXAM: DG ABDOMEN ACUTE WITH 1 VIEW CHEST COMPARISON:  August 19, 2020 FINDINGS: There is no evidence of dilated bowel loops or free intraperitoneal air. No radiopaque calculi or other significant radiographic abnormality is seen. Subcentimeter phleboliths are noted within the lower pelvis. Heart size and mediastinal contours are within normal limits. Both lungs are clear. IMPRESSION: Negative abdominal radiographs.  No acute cardiopulmonary disease. Electronically Signed   By: Virgina Norfolk M.D.   On: 06/22/2021 21:32    Procedures Procedures   Medications Ordered in ED Medications  alum & mag hydroxide-simeth (MAALOX/MYLANTA) 200-200-20 MG/5ML suspension 30 mL (has no administration in time range)    And  lidocaine (XYLOCAINE) 2 % viscous mouth solution 15 mL (has no administration in time range)  LORazepam (ATIVAN) injection 1 mg (1  mg Intravenous Given 06/23/21 2228)  iohexol (OMNIPAQUE) 300 MG/ML solution 100 mL (100 mLs Intravenous Contrast Given 06/23/21 2234)     ED Course  I have reviewed the triage vital signs and the nursing notes.  Pertinent labs & imaging results that were available during my care of the patient were reviewed by me and considered in my medical decision making (see chart for details).    MDM Rules/Calculators/A&P                         Given inability to obtain clear history with abdominal tenderness present repeat work-up pursued.  Vital signs within normal limits.  Basic labs been ordered.  CT imaging of abdomen pelvis also ordered.   Is unremarkable CT on pelvis consistent with findings of esophagitis.  Will recommend outpatient follow-up with her doctor within the week as well as outpatient follow-up with gastroenterology within the week.  Phone number provided for the patient to call to make an appointment.  Advised return for any additional concerns otherwise will sober up here in the ER be discharged home with.    Final Clinical Impression(s) / ED Diagnoses Final diagnoses:  Alcohol abuse  Esophagitis    Rx / DC Orders ED Discharge Orders     None        Luna Fuse, MD 06/23/21 2157    Luna Fuse, MD 06/23/21 [phone]

## 2021-06-25 LAB — URINE CULTURE: Culture: 10000 — AB

## 2021-06-26 ENCOUNTER — Emergency Department (HOSPITAL_COMMUNITY): Payer: Medicare Other

## 2021-06-26 ENCOUNTER — Encounter (HOSPITAL_COMMUNITY): Payer: Self-pay

## 2021-06-26 ENCOUNTER — Other Ambulatory Visit: Payer: Self-pay

## 2021-06-26 ENCOUNTER — Emergency Department (HOSPITAL_COMMUNITY)
Admission: EM | Admit: 2021-06-26 | Discharge: 2021-06-27 | Disposition: A | Discharge status: Home / Self Care | Payer: Medicare Other | Attending: Emergency Medicine | Admitting: Emergency Medicine

## 2021-06-26 DIAGNOSIS — K209 Esophagitis, unspecified without bleeding: Secondary | ICD-10-CM

## 2021-06-26 DIAGNOSIS — F1020 Alcohol dependence, uncomplicated: Secondary | ICD-10-CM

## 2021-06-26 DIAGNOSIS — Z87891 Personal history of nicotine dependence: Secondary | ICD-10-CM | POA: Insufficient documentation

## 2021-06-26 DIAGNOSIS — J449 Chronic obstructive pulmonary disease, unspecified: Secondary | ICD-10-CM | POA: Insufficient documentation

## 2021-06-26 DIAGNOSIS — Z79899 Other long term (current) drug therapy: Secondary | ICD-10-CM | POA: Insufficient documentation

## 2021-06-26 DIAGNOSIS — E876 Hypokalemia: Secondary | ICD-10-CM | POA: Diagnosis not present

## 2021-06-26 DIAGNOSIS — I10 Essential (primary) hypertension: Secondary | ICD-10-CM | POA: Diagnosis not present

## 2021-06-26 DIAGNOSIS — K2920 Alcoholic gastritis without bleeding: Secondary | ICD-10-CM

## 2021-06-26 DIAGNOSIS — Y908 Blood alcohol level of 240 mg/100 ml or more: Secondary | ICD-10-CM | POA: Insufficient documentation

## 2021-06-26 DIAGNOSIS — E039 Hypothyroidism, unspecified: Secondary | ICD-10-CM | POA: Diagnosis not present

## 2021-06-26 DIAGNOSIS — R079 Chest pain, unspecified: Secondary | ICD-10-CM | POA: Diagnosis not present

## 2021-06-26 LAB — CBC WITH DIFFERENTIAL/PLATELET
Abs Immature Granulocytes: 0.06 10*3/uL (ref 0.00–0.07)
Basophils Absolute: 0 10*3/uL (ref 0.0–0.1)
Basophils Relative: 0 %
Eosinophils Absolute: 0 10*3/uL (ref 0.0–0.5)
Eosinophils Relative: 0 %
HCT: 31.8 % — ABNORMAL LOW (ref 36.0–46.0)
Hemoglobin: 10.9 g/dL — ABNORMAL LOW (ref 12.0–15.0)
Immature Granulocytes: 1 %
Lymphocytes Relative: 15 %
Lymphs Abs: 1.8 10*3/uL (ref 0.7–4.0)
MCH: 28 pg (ref 26.0–34.0)
MCHC: 34.3 g/dL (ref 30.0–36.0)
MCV: 81.7 fL (ref 80.0–100.0)
Monocytes Absolute: 0.7 10*3/uL (ref 0.1–1.0)
Monocytes Relative: 6 %
Neutro Abs: 9.1 10*3/uL — ABNORMAL HIGH (ref 1.7–7.7)
Neutrophils Relative %: 78 %
Platelets: 281 10*3/uL (ref 150–400)
RBC: 3.89 MIL/uL (ref 3.87–5.11)
RDW: 14.9 % (ref 11.5–15.5)
WBC: 11.7 10*3/uL — ABNORMAL HIGH (ref 4.0–10.5)
nRBC: 0 % (ref 0.0–0.2)

## 2021-06-26 LAB — COMPREHENSIVE METABOLIC PANEL
ALT: 14 U/L (ref 0–44)
AST: 22 U/L (ref 15–41)
Albumin: 3.1 g/dL — ABNORMAL LOW (ref 3.5–5.0)
Alkaline Phosphatase: 99 U/L (ref 38–126)
Anion gap: 17 — ABNORMAL HIGH (ref 5–15)
BUN: 11 mg/dL (ref 8–23)
CO2: 17 mmol/L — ABNORMAL LOW (ref 22–32)
Calcium: 7.6 mg/dL — ABNORMAL LOW (ref 8.9–10.3)
Chloride: 104 mmol/L (ref 98–111)
Creatinine, Ser: 0.8 mg/dL (ref 0.44–1.00)
GFR, Estimated: >60 mL/min (ref >60)
Glucose, Bld: 120 mg/dL — ABNORMAL HIGH (ref 70–99)
Potassium: 2.8 mmol/L — ABNORMAL LOW (ref 3.5–5.1)
Sodium: 138 mmol/L (ref 135–145)
Total Bilirubin: 0.6 mg/dL (ref 0.3–1.2)
Total Protein: 5.5 g/dL — ABNORMAL LOW (ref 6.5–8.1)

## 2021-06-26 LAB — LIPASE, BLOOD: Lipase: 24 U/L (ref 11–51)

## 2021-06-26 LAB — ETHANOL: Alcohol, Ethyl (B): 324 mg/dL (ref <10)

## 2021-06-26 LAB — MAGNESIUM: Magnesium: 1.5 mg/dL — ABNORMAL LOW (ref 1.7–2.4)

## 2021-06-26 LAB — TROPONIN I (HIGH SENSITIVITY): Troponin I (High Sensitivity): 13 ng/L (ref <18)

## 2021-06-26 MED ORDER — POTASSIUM CHLORIDE 10 MEQ/100ML IV SOLN
10.0000 meq | Freq: Once | INTRAVENOUS | Status: AC
  Administered 2021-06-26: 10 meq via INTRAVENOUS
  Filled 2021-06-26: qty 100

## 2021-06-26 MED ORDER — ASPIRIN 81 MG PO CHEW
324.0000 mg | CHEWABLE_TABLET | Freq: Once | ORAL | Status: AC
  Administered 2021-06-26: 23:00:00 324 mg via ORAL

## 2021-06-26 MED ORDER — MAGNESIUM SULFATE 2 GM/50ML IV SOLN
2.0000 g | INTRAVENOUS | Status: AC
  Administered 2021-06-26: 2 g via INTRAVENOUS
  Filled 2021-06-26: qty 50

## 2021-06-26 MED ORDER — ONDANSETRON HCL 4 MG/2ML IJ SOLN
4.0000 mg | INTRAMUSCULAR | Status: AC
  Administered 2021-06-26: 23:00:00 4 mg via INTRAVENOUS
  Filled 2021-06-26: qty 2

## 2021-06-26 MED ORDER — SODIUM CHLORIDE 0.9 % IV BOLUS
1000.0000 mL | Freq: Once | INTRAVENOUS | Status: AC
  Administered 2021-06-26: 23:00:00 1000 mL via INTRAVENOUS

## 2021-06-26 MED ORDER — ALUM & MAG HYDROXIDE-SIMETH 200-200-20 MG/5ML PO SUSP
30.0000 mL | Freq: Once | ORAL | Status: AC
  Administered 2021-06-26: 30 mL via ORAL
  Filled 2021-06-26: qty 30

## 2021-06-26 MED ORDER — POTASSIUM CHLORIDE CRYS ER 20 MEQ PO TBCR
40.0000 meq | EXTENDED_RELEASE_TABLET | Freq: Once | ORAL | Status: AC
  Administered 2021-06-26: 40 meq via ORAL
  Filled 2021-06-26: qty 2

## 2021-06-26 MED ORDER — PANTOPRAZOLE SODIUM 40 MG IV SOLR
40.0000 mg | INTRAVENOUS | Status: AC
  Administered 2021-06-26: 23:00:00 40 mg via INTRAVENOUS
  Filled 2021-06-26: qty 40

## 2021-06-26 MED ORDER — POTASSIUM CHLORIDE 10 MEQ/100ML IV SOLN
10.0000 meq | Freq: Once | INTRAVENOUS | Status: AC
  Administered 2021-06-27: 10 meq via INTRAVENOUS
  Filled 2021-06-26: qty 100

## 2021-06-26 MED ORDER — LIDOCAINE VISCOUS HCL 2 % MT SOLN
15.0000 mL | Freq: Once | OROMUCOSAL | Status: AC
  Administered 2021-06-26: 15 mL via ORAL
  Filled 2021-06-26: qty 15

## 2021-06-26 NOTE — ED Triage Notes (Signed)
Pt was sent for having abdominal pain and chest pain today, has been drinking and said all pain started after that

## 2021-06-26 NOTE — ED Provider Notes (Signed)
Care assumed from Dr. Sabra Heck, patient with alcohol intoxication, hypokalemia, hypomagnesemia, chest pain which is felt to be likely GI in origin.  Currently pending repeat troponin and will need to observe till she is sober enough to go home.  Repeat troponin is not significantly changed.  Patient was observed overnight and is felt to be sober enough for discharge at this point.  Because of hypokalemia, she is given a prescription for oral potassium and is also given outpatient resources for substance abuse.   Delora Fuel, MD 39/03/00 [phone]

## 2021-06-26 NOTE — ED Provider Notes (Signed)
[facility] EMERGENCY DEPARTMENT Provider Note   CSN: [id number] Arrival date & time: 06/26/21  2149     History Chief Complaint  Patient presents with   Chest Pain    MERSADIE KAVANAUGH is a 70 y.o. female.   Chest Pain  This patient is a 70 year old female, she has multiple medical problems including alcohol abuse, she has had a prior small bowel obstruction, COPD, she does not currently take any inhalers for this.  She also has a history of alcohol withdrawal seizures, delirium tremens, hyponatremia and had recently been evaluated in the emergency department twice in the last week because of alcohol abuse and intoxication.  She presents to the hospital this evening with a recurrent complaint of epigastric discomfort, she states that she had been drinking fairly heavily today, she is drinks vodka, she denies beer or wine or other drugs of abuse.  She reports that she was drinking fairly heavily as she do every day and then noticed that she developed some epigastric pain with some nausea occasional vomiting occasional diarrhea.  She denies having tremors, denies headache, denies chest congestion, denies coughing, denies blood in the stools, denies swelling in the legs, denies lower abdominal pain.  My review of the medical record shows that the patient had visited the emergency department on 21 December and had an abdominal x-ray which was unremarkable, she followed up on the next day on 22 December during which time she had a CT scan of the abdomen and pelvis which showed some esophagitis but no other acute abdominal findings.   Past Medical History:  Diagnosis Date   Chronic pain    COPD (chronic obstructive pulmonary disease) ([place])    told has copd, no current inhaler use   Cough    Depression    ETOH abuse    GERD (gastroesophageal reflux disease)    Hypertension    Hypothyroidism    Insomnia    Migraine    Migraine    Osteopenia    Pain management    Panic attacks     Small bowel obstruction [facility])     Patient Active Problem List   Diagnosis Date Noted   Acute cystitis without hematuria    Seizure ([place]) 06/06/2021   DTs (delirium tremens) ([place]) 06/06/2021   AKI (acute kidney injury) ([place]) 05/19/2021   Elevated LFTs 74/06/8785   Metabolic acidosis 76/72/0947   COVID-19 virus infection 04/15/2021    Class: Acute   Hypotensive episode 04/15/2021    Class: Acute   Physical deconditioning    Vitamin B12 deficiency 02/04/2021   Leukocytosis 11/23/2020   Alcohol use disorder, severe, dependence ([place]) 10/08/2020   Hyponatremia 09/17/2020   Alcohol withdrawal ([place]) 09/12/2020   Chronic bronchitis ([place]) 08/20/2020   Moderate protein malnutrition (HCC) 08/20/2020   Chronic low back pain 08/19/2020   Elevated SGOT ([facility]) 08/19/2020   Elevated troponin 08/19/2020   Increased serum lipase level 08/19/2020   Alcohol abuse 06/21/2020   Large bowel obstruction (HCC)    Small bowel obstruction ([place]) 07/20/2018   Hypomagnesemia 07/20/2018   Cough 03/14/2018   Polypharmacy 02/23/2017   Hypokalemia 09/62/8366   Toxic metabolic encephalopathy 29/47/6546   Anxiety 02/22/2017   GERD (gastroesophageal reflux disease) 02/22/2017   Altered mental status    Pain medication agreement signed 10/10/2016   Opioid dependence ([place]) 10/10/2016   Status post colostomy takedown 04/12/2016   Chronic constipation 12/03/2015   Peritonitis with abscess of intestine ([place]) 11/29/2015   COPD exacerbation ([place])  09/20/2015   Osteopenia 06/10/2014   Vitamin D deficiency 06/10/2014   Hypothyroidism    Depression 11/18/2012   Insomnia 11/18/2012   Chronic pain syndrome 11/18/2012   HLD (hyperlipidemia) 09/01/2012   S/P colostomy ([place]) 04/19/2012   Normocytic anemia 10/15/2011   Hypertension 10/13/2011   Chronic back pain 10/13/2011   COPD (chronic obstructive pulmonary disease) ([place]) 10/09/2011    Past Surgical History:  Procedure Laterality Date   ABDOMINAL HYSTERECTOMY      COLON SURGERY     COLOSTOMY CLOSURE  04/19/2012   Procedure: COLOSTOMY CLOSURE;  Surgeon: Adin Hector, MD;  Location: [facility];  Service: General;  Laterality: N/A;  Laparotomy, Resection and Closure of Colostomy   COLOSTOMY TAKEDOWN N/A 04/12/2016   Procedure: [place] TAKEDOWN;  Surgeon: Johnathan Hausen, MD;  Location: [facility];  Service: General;  Laterality: N/A;   INCONTINENCE SURGERY     LAPAROTOMY  10/04/2011, colostomy also   Procedure: EXPLORATORY LAPAROTOMY;  Surgeon: Adin Hector, MD;  Location: [facility];  Service: General;  Laterality: N/A;  left partial colectomy with colostomy   LAPAROTOMY  04/19/2012   Procedure: EXPLORATORY LAPAROTOMY;  Surgeon: Adin Hector, MD;  Location: [facility];  Service: General;  Laterality: N/A;   LAPAROTOMY N/A 11/29/2015   Procedure: EXPLORATORY LAPAROTOMY; SUBTOTAL COLECTOMY WITH HARTMAN PROCEDURE AND END COLOSTOMY;  Surgeon: Johnathan Hausen, MD;  Location: [facility];  Service: General;  Laterality: N/A;   TUBAL LIGATION     VENTRAL HERNIA REPAIR  04/19/2012   Procedure: HERNIA REPAIR VENTRAL ADULT;  Surgeon: Adin Hector, MD;  Location: [facility];  Service: General;  Laterality: N/A;     OB History     Gravida      Para      Term      Preterm      AB      Living  5      SAB      IAB      Ectopic      Multiple      Live Births              Family History  Problem Relation Age of Onset   Heart disease Mother    Hypertension Mother    Cancer Sister        sinus   Diabetes Brother    Heart disease Brother    Thyroid disease Brother    Cancer Sister        abdominal ?   Thyroid disease Sister    Cancer Other        GE junction adenocarcinoma   Emphysema Father    Stroke Father    Hypertension Father    Heart disease Father    COPD Sister    Thyroid disease Sister    Thyroid disease Sister    Diabetes Brother    Thyroid disease Brother    Thyroid disease Brother    Hypertension Brother    Post-traumatic  stress disorder Brother    COPD Brother    Thyroid disease Brother    Thyroid disease Brother    Hypertension Brother    Transient ischemic attack Brother     Social History   Tobacco Use   Smoking status: Former    Packs/day: 1.50    Years: 20.00    Pack years: 30.00    Types: Cigarettes    Quit date: 10/04/1998    Years since quitting: 22.7   Smokeless tobacco: Never  Vaping Use   Vaping Use: Never used  Substance Use Topics   Alcohol use: Yes    Comment: 1bottle vodka -3 days. As of 03/01/21 pt  states she has quit drinking"for several days."   Drug use: No    Home Medications Prior to Admission medications   Medication Sig Start Date End Date Taking? Authorizing Provider  amLODipine (NORVASC) 5 MG tablet Take 1 tablet (5 mg total) by mouth daily. 05/22/21   Regalado, Belkys A, MD  fluticasone-salmeterol (ADVAIR DISKUS) 250-50 MCG/ACT AEPB Inhale 1 puff into the lungs in the morning and at bedtime. Patient not taking: Reported on 05/25/2021 04/19/21 05/20/21  Deatra James, MD  folic acid (FOLVITE) 1 MG tablet Take 1 tablet (1 mg total) by mouth daily. Patient not taking: Reported on 05/25/2021 05/22/21   Niel Hummer A, MD  levothyroxine (SYNTHROID) 50 MCG tablet Take 1 tablet (50 mcg total) by mouth daily before breakfast. 06/22/20   Evelina Dun A, FNP  metoprolol tartrate (LOPRESSOR) 25 MG tablet Take 0.5 tablets (12.5 mg total) by mouth 2 (two) times daily. 06/13/21 07/13/21  Barton Dubois, MD  Multiple Vitamin (MULTIVITAMIN WITH MINERALS) TABS tablet Take 1 tablet by mouth daily. Patient not taking: Reported on 05/25/2021 05/22/21   Regalado, Jerald Kief A, MD  pantoprazole (PROTONIX) 40 MG tablet Take 1 tablet (40 mg total) by mouth daily. 06/13/21 06/13/22  Barton Dubois, MD  sertraline (ZOLOFT) 50 MG tablet Take 1 tablet (50 mg total) by mouth at bedtime. 06/13/21   Barton Dubois, MD  temazepam (RESTORIL) 15 MG capsule Take 1 capsule (15 mg total) by mouth at  bedtime as needed for sleep. 06/13/21   Barton Dubois, MD  thiamine 100 MG tablet Take 1 tablet (100 mg total) by mouth daily. Patient not taking: Reported on 05/25/2021 05/22/21   Regalado, Jerald Kief A, MD  traMADol (ULTRAM) 50 MG tablet Take 2 tablets (100 mg total) by mouth every 6 (six) hours as needed for severe pain. for pain 06/13/21   Barton Dubois, MD  vitamin B-12 (CYANOCOBALAMIN) 1000 MCG tablet Take 1 tablet (1,000 mcg total) by mouth daily. Patient not taking: Reported on 05/25/2021 05/21/21   Regalado, Jerald Kief A, MD  Vitamin D, Ergocalciferol, (DRISDOL) 1.25 MG (50000 UNIT) CAPS capsule Take 1 capsule (50,000 Units total) by mouth every 7 (seven) days. Patient not taking: Reported on 05/25/2021 06/22/20   [name], FNP    Allergies    Librium [chlordiazepoxide], Toradol [ketorolac tromethamine], Butalbital-apap-caffeine, Demerol, Esgic [butalbital-apap-caffeine], and Iodine  Review of Systems   Review of Systems  Cardiovascular:  Positive for chest pain.  All other systems reviewed and are negative.  Physical Exam Updated Vital Signs BP 134/87 (BP Location: Right Arm)    Pulse (!) 109    Temp 97.9 F (36.6 C) (Oral)    Resp 20    Ht 1.626 m (5\' 4" )    Wt 59 kg    SpO2 99%    BMI 22.31 kg/m   Physical Exam Vitals and nursing note reviewed.  Constitutional:      General: She is not in acute distress.    Appearance: She is well-developed.  HENT:     Head: Normocephalic and atraumatic.     Nose: No congestion or rhinorrhea.     Mouth/Throat:     Mouth: Mucous membranes are moist.     Pharynx: No oropharyngeal exudate.  Eyes:     General: No scleral icterus.  Right eye: No discharge.        Left eye: No discharge.     Conjunctiva/sclera: Conjunctivae normal.     Pupils: Pupils are equal, round, and reactive to light.  Neck:     Thyroid: No thyromegaly.     Vascular: No JVD.  Cardiovascular:     Rate and Rhythm: Regular rhythm. Tachycardia present.      Heart sounds: Normal heart sounds. No murmur heard.   No friction rub. No gallop.     Comments: Heart rate of 105 to 110 bpm and what appears to be sinus tachycardia Pulmonary:     Effort: Pulmonary effort is normal. No respiratory distress.     Breath sounds: Normal breath sounds. No wheezing or rales.  Abdominal:     General: Bowel sounds are normal. There is no distension.     Palpations: Abdomen is soft. There is no mass.     Tenderness: There is abdominal tenderness.     Comments: Minimal tenderness in the epigastrium, no other abdominal tenderness  Musculoskeletal:        General: No tenderness. Normal range of motion.     Cervical back: Normal range of motion and neck supple.  Lymphadenopathy:     Cervical: No cervical adenopathy.  Skin:    General: Skin is warm and dry.     Findings: No erythema or rash.  Neurological:     Mental Status: She is alert.     Coordination: Coordination normal.  Psychiatric:        Behavior: Behavior normal.    ED Results / Procedures / Treatments   Labs (all labs ordered are listed, but only abnormal results are displayed) Labs Reviewed  CBC WITH DIFFERENTIAL/PLATELET - Abnormal; Notable for the following components:      Result Value   WBC 11.7 (*)    Hemoglobin 10.9 (*)    HCT 31.8 (*)    Neutro Abs 9.1 (*)    All other components within normal limits  COMPREHENSIVE METABOLIC PANEL - Abnormal; Notable for the following components:   Potassium 2.8 (*)    CO2 17 (*)    Glucose, Bld 120 (*)    Calcium 7.6 (*)    Total Protein 5.5 (*)    Albumin 3.1 (*)    Anion gap 17 (*)    All other components within normal limits  ETHANOL - Abnormal; Notable for the following components:   Alcohol, Ethyl (B) 324 (*)    All other components within normal limits  MAGNESIUM - Abnormal; Notable for the following components:   Magnesium 1.5 (*)    All other components within normal limits  LIPASE, BLOOD  TROPONIN I (HIGH SENSITIVITY)     EKG EKG Interpretation  Date/Time:  Sunday June 26 2021 22:02:12 EST Ventricular Rate:  110 PR Interval:  123 QRS Duration: 78 QT Interval:  345 QTC Calculation: 467 R Axis:   99 Text Interpretation: Sinus or ectopic atrial tachycardia Right axis deviation since last tracing no significant change Confirmed by Noemi Chapel [id number]) on 06/26/2021 10:07:57 PM  Radiology DG Chest Portable 1 View  Result Date: 06/26/2021 CLINICAL DATA:  Chest pain. EXAM: PORTABLE CHEST 1 VIEW COMPARISON:  Chest radiograph 04/18/2021. FINDINGS: Faint streaky densities at the left lung base may be chronic or atelectasis. Developing infiltrate is less likely. No focal consolidation, pleural effusion, pneumothorax. Stable cardiac silhouette. Atherosclerotic calcification of the aorta. No acute osseous pathology. IMPRESSION: No focal consolidation. Electronically Signed   By:  Anner Crete M.D.   On: 06/26/2021 22:49    Procedures Procedures   Medications Ordered in ED Medications  alum & mag hydroxide-simeth (MAALOX/MYLANTA) 200-200-20 MG/5ML suspension 30 mL (has no administration in time range)    And  lidocaine (XYLOCAINE) 2 % viscous mouth solution 15 mL (has no administration in time range)  potassium chloride 10 mEq in 100 mL IVPB (has no administration in time range)  magnesium sulfate IVPB 2 g 50 mL (has no administration in time range)  potassium chloride SA (KLOR-CON M) CR tablet 40 mEq (has no administration in time range)  potassium chloride 10 mEq in 100 mL IVPB (has no administration in time range)  sodium chloride 0.9 % bolus 1,000 mL (1,000 mLs Intravenous New Bag/Given 06/26/21 2232)  ondansetron (ZOFRAN) injection 4 mg (4 mg Intravenous Given 06/26/21 2232)  pantoprazole (PROTONIX) injection 40 mg (40 mg Intravenous Given 06/26/21 2232)  aspirin chewable tablet 324 mg (324 mg Oral Given by Other 06/26/21 2319)    ED Course  I have reviewed the triage vital signs and the  nursing notes.  Pertinent labs & imaging results that were available during my care of the patient were reviewed by me and considered in my medical decision making (see chart for details).    MDM Rules/Calculators/A&P                          This patient presents to the ED for concern of abdominal pain or chest pain, this involves an extensive number of treatment options, and is a complaint that carries with it a high risk of complications and morbidity.  The differential diagnosis includes cardiac disease, pulmonary disease, gastritis, pancreatitis, esophagitis, perforated bowel, coronary disease   Additional history obtained:  Additional history obtained from electronic medical record and the paramedics External records from outside source obtained and reviewed including extensive work-ups in the past including CT scans, abdominal x-rays, labs and is actually been admitted to the hospital for alcohol withdrawal delirium tremens   Lab Tests:  I Ordered, reviewed, and interpreted labs.  The pertinent results include: CBC metabolic panel magnesium alcohol level, troponin is 13, alcohol level is over 300   Imaging Studies ordered:  I ordered imaging studies including chest x-ray to rule out free air I independently visualized and interpreted imaging which showed no acute findings on the chest x-ray I agree with the radiologist interpretation   Cardiac Monitoring:  The patient was maintained on a cardiac monitor.  I personally viewed and interpreted the cardiac monitored which showed an underlying rhythm of: Mild sinus tachycardia   Medicines ordered and prescription drug management:  I ordered medication including GI cocktail, Protonix, aspirin for chest pain and possible gastritis Reevaluation of the patient after these medicines showed that the patient improved I have reviewed the patients home medicines and have made adjustments as needed   Critical Interventions:  Check  for alcohol level Labs, found to be hypokalemic, replaced with magnesium and potassium IV fluids, IV proton pump inhibitor, GI cocktail   ED Course:  Patient will need to be observed in the emergency department until she is sober and until her second troponin is back, first troponin is negative  Reevaluation:  After the interventions noted above, I reevaluated the patient and found that they have :improved   Dispostion:  After consideration of the diagnostic results and the patients response to treatment feel that the patent would benefit from likely discharge  as long as she continues to improve in her second troponin is negative.  At change of shift, care signed out to Dr. Roxanne Mins to follow-up results and disposition accordingly.       Final Clinical Impression(s) / ED Diagnoses Final diagnoses:  Alcoholism ([place])  Hypokalemia  Acute alcoholic gastritis without hemorrhage  Esophagitis    Rx / DC Orders ED Discharge Orders     None        Noemi Chapel, MD 06/26/21 2330

## 2021-06-27 DIAGNOSIS — K2920 Alcoholic gastritis without bleeding: Secondary | ICD-10-CM | POA: Diagnosis not present

## 2021-06-27 LAB — TROPONIN I (HIGH SENSITIVITY): Troponin I (High Sensitivity): 15 ng/L (ref <18)

## 2021-06-27 MED ORDER — POTASSIUM CHLORIDE CRYS ER 20 MEQ PO TBCR: 20.0000 meq | EXTENDED_RELEASE_TABLET | Freq: Two times a day (BID) | ORAL | 0 refills | Status: DC

## 2021-06-27 MED ORDER — LORAZEPAM 2 MG/ML IJ SOLN
1.0000 mg | Freq: Once | INTRAMUSCULAR | Status: AC
  Administered 2021-06-27: 03:00:00 1 mg via INTRAVENOUS
  Filled 2021-06-27: qty 1

## 2021-06-27 MED ORDER — LORAZEPAM 1 MG PO TABS
1.0000 mg | ORAL_TABLET | Freq: Once | ORAL | Status: AC
  Administered 2021-06-27: 06:00:00 1 mg via ORAL
  Filled 2021-06-27: qty 1

## 2021-06-27 MED ORDER — LORAZEPAM 2 MG/ML IJ SOLN
2.0000 mg | Freq: Once | INTRAMUSCULAR | Status: AC
  Administered 2021-06-27: 08:00:00 2 mg via INTRAVENOUS
  Filled 2021-06-27: qty 1

## 2021-06-27 NOTE — ED Notes (Signed)
Pt is complaining  of being shaky again.

## 2021-06-27 NOTE — Discharge Instructions (Signed)
Do not drink alcohol! It will make your acid reflux worse!

## 2021-06-28 DIAGNOSIS — M549 Dorsalgia, unspecified: Secondary | ICD-10-CM | POA: Diagnosis not present

## 2021-06-28 DIAGNOSIS — R000 Tachycardia, unspecified: Secondary | ICD-10-CM | POA: Diagnosis not present

## 2021-06-28 DIAGNOSIS — R0789 Other chest pain: Secondary | ICD-10-CM | POA: Diagnosis not present

## 2021-06-28 DIAGNOSIS — Z743 Need for continuous supervision: Secondary | ICD-10-CM | POA: Diagnosis not present

## 2021-06-28 DIAGNOSIS — R079 Chest pain, unspecified: Secondary | ICD-10-CM | POA: Diagnosis not present

## 2021-07-01 ENCOUNTER — Ambulatory Visit (INDEPENDENT_AMBULATORY_CARE_PROVIDER_SITE_OTHER): Payer: Medicare Other | Admitting: Family

## 2021-07-01 ENCOUNTER — Other Ambulatory Visit: Payer: Self-pay | Admitting: Family

## 2021-07-01 ENCOUNTER — Encounter: Payer: Self-pay | Admitting: Family

## 2021-07-01 VITALS — BP 139/86 | HR 104 | Temp 98.0°F | Ht 64.0 in | Wt 113.2 lb

## 2021-07-01 DIAGNOSIS — Z9181 History of falling: Secondary | ICD-10-CM

## 2021-07-01 DIAGNOSIS — R5381 Other malaise: Secondary | ICD-10-CM

## 2021-07-01 DIAGNOSIS — F1010 Alcohol abuse, uncomplicated: Secondary | ICD-10-CM | POA: Diagnosis not present

## 2021-07-01 DIAGNOSIS — R531 Weakness: Secondary | ICD-10-CM

## 2021-07-01 DIAGNOSIS — R399 Unspecified symptoms and signs involving the genitourinary system: Secondary | ICD-10-CM | POA: Diagnosis not present

## 2021-07-01 DIAGNOSIS — E876 Hypokalemia: Secondary | ICD-10-CM

## 2021-07-01 DIAGNOSIS — G8929 Other chronic pain: Secondary | ICD-10-CM

## 2021-07-01 DIAGNOSIS — M545 Low back pain, unspecified: Secondary | ICD-10-CM

## 2021-07-01 DIAGNOSIS — F1020 Alcohol dependence, uncomplicated: Secondary | ICD-10-CM

## 2021-07-01 LAB — URINALYSIS, COMPLETE
Bilirubin, UA: NEGATIVE
Glucose, UA: NEGATIVE
Ketones, UA: NEGATIVE
Nitrite, UA: NEGATIVE
Protein,UA: NEGATIVE
RBC, UA: NEGATIVE
Specific Gravity, UA: 1.015 (ref 1.005–1.030)
Urobilinogen, Ur: 0.2 mg/dL (ref 0.2–1.0)
pH, UA: 6.5 (ref 5.0–7.5)

## 2021-07-01 LAB — MICROSCOPIC EXAMINATION
RBC, Urine: NONE SEEN /hpf (ref 0–2)
Renal Epithel, UA: NONE SEEN /hpf

## 2021-07-01 MED ORDER — CEPHALEXIN 500 MG PO CAPS: 500.0000 mg | ORAL_CAPSULE | Freq: Two times a day (BID) | ORAL | 0 refills | Status: DC

## 2021-07-01 NOTE — Progress Notes (Signed)
Subjective:    Patient ID: Stacy Dalton, female    DOB: 1950/12/30, 70 y.o.   MRN: [id number]  Chief Complaint  Patient presents with   Fatigue    Wants to get somewhere to help with alcohol and rehab    Fall   Alcohol Problem   Pt presents to the office today to discuss rehab for alcohol. She reports she has not drink since 06/26/21.   She reports she is falling and weak. She went to the ED on 06/28/21 for alcohol abuse. Found to have to have K+ 2.9.  Fall The accident occurred More than 1 week ago. Associated symptoms include nausea. Pertinent negatives include no hematuria or numbness.  Alcohol Problem The patient's primary symptoms include weakness. Pertinent negatives include no agitation, intoxication or self-injury. This is a chronic problem. Associated symptoms include nausea. The treatment provided no relief.  Dysuria  This is a new problem. The current episode started 1 to 4 weeks ago. The problem occurs intermittently. The problem has been waxing and waning. The quality of the pain is described as burning. The pain is at a severity of 3/10. The pain is mild. Associated symptoms include frequency, hesitancy, nausea and urgency. Pertinent negatives include no hematuria. She has tried increased fluids for the symptoms. The treatment provided mild relief.  Back Pain This is a chronic problem. The current episode started more than 1 year ago. The problem occurs constantly. The problem has been waxing and waning since onset. The pain is present in the lumbar spine. The quality of the pain is described as aching. The pain is at a severity of 10/10. The pain is moderate. Associated symptoms include dysuria and weakness. Pertinent negatives include no numbness. She has tried bed rest for the symptoms. The treatment provided moderate relief.     Review of Systems  Gastrointestinal:  Positive for nausea.  Genitourinary:  Positive for dysuria, frequency, hesitancy and urgency.  Negative for hematuria.  Musculoskeletal:  Positive for back pain.  Neurological:  Positive for weakness. Negative for numbness.  Psychiatric/Behavioral:  Negative for agitation and self-injury.   All other systems reviewed and are negative.     Objective:   Physical Exam Vitals reviewed.  Constitutional:      General: She is not in acute distress.    Appearance: She is well-developed.  HENT:     Head: Normocephalic and atraumatic.     Right Ear: Tympanic membrane normal.     Left Ear: Tympanic membrane normal.  Eyes:     Pupils: Pupils are equal, round, and reactive to light.  Neck:     Thyroid: No thyromegaly.  Cardiovascular:     Rate and Rhythm: Normal rate and regular rhythm.     Heart sounds: Normal heart sounds. No murmur heard. Pulmonary:     Effort: Pulmonary effort is normal. No respiratory distress.     Breath sounds: Normal breath sounds. No wheezing.  Abdominal:     General: Bowel sounds are normal. There is no distension.     Palpations: Abdomen is soft.     Tenderness: There is no abdominal tenderness.  Musculoskeletal:        General: No tenderness. Normal range of motion.     Cervical back: Normal range of motion and neck supple.     Comments: Pain in lumbar with flexion and extension  Skin:    General: Skin is warm and dry.  Neurological:     Mental Status: She is alert  and oriented to person, place, and time.     Cranial Nerves: No cranial nerve deficit.     Motor: Weakness present.     Deep Tendon Reflexes: Reflexes are normal and symmetric.  Psychiatric:        Behavior: Behavior normal.        Thought Content: Thought content normal.        Judgment: Judgment normal.      BP 139/86    Pulse (!) 104    Temp 98 F (36.7 C) (Temporal)    Ht _0  (1.626 m)    Wt 113 lb 3.2 oz (51.3 kg)    BMI 19.43 kg/m      Assessment & Plan:  SHAKEIRA RHEE comes in today with chief complaint of Fatigue (Wants to get somewhere to help with alcohol and  rehab ), Fall, and Alcohol Problem   Diagnosis and orders addressed:  1. UTI symptoms - Urine Culture - Urinalysis, Complete - CMP14+EGFR - CBC with Differential/Platelet  2. Alcohol abuse - CMP14+EGFR - CBC with Differential/Platelet  3. At risk for falls - CMP14+EGFR - CBC with Differential/Platelet  4. Weakness - CMP14+EGFR - CBC with Differential/Platelet  5. Alcohol use disorder, severe, dependence (HCC) - CMP14+EGFR - CBC with Differential/Platelet  6. Chronic bilateral low back pain, unspecified whether sciatica present - CMP14+EGFR - CBC with Differential/Platelet  7. Physical deconditioning - CMP14+EGFR - CBC with Differential/Platelet  8. Hypokalemia - CMP14+EGFR - CBC with Differential/Platelet   Labs pending FL2 completed and given to patient  Health Maintenance reviewed Diet and exercise encouraged  Follow up plan: After discharge from rehab  Evelina Dun, [place]

## 2021-07-01 NOTE — Patient Instructions (Signed)
Alcohol Abuse and Dependence Information, Adult Alcohol is a widely available drug. People drink alcohol in different amounts. People who drink alcohol very often and in large amounts often have problems during and after drinking. They may develop what is called an alcohol use disorder. There are two main types of alcohol use disorders: Alcohol abuse. This is when you use alcohol too much or too often. You may use alcohol to make yourself feel happy or to reduce stress. You may have a hard time setting a limit on the amount you drink. Alcohol dependence. This is when you use alcohol consistently for a period of time, and your body changes as a result. This can make it hard to stop drinking because you may start to feel sick or feel different when you do not use alcohol. These symptoms are known as withdrawal. How can alcohol abuse and dependence affect me? Alcohol abuse and dependence can have a negative effect on your life. Drinking too much can lead to addiction. You may feel like you need alcohol to function normally. You may drink alcohol before work in the morning, during the day, or as soon as you get home from work in the evening. These actions can result in: Poor work performance. Job loss. Financial problems. Car crashes or criminal charges from driving after drinking alcohol. Problems in your relationships with friends and family. Losing the trust and respect of coworkers, friends, and family. Drinking heavily over a long period of time can permanently damage your body and brain, and can cause lifelong health issues, such as: Damage to your liver or pancreas. Heart problems, high blood pressure, or stroke. Certain cancers. Decreased ability to fight infections. Brain or nerve damage. Depression. Early (premature) death. If you are careless or you crave alcohol, it is easy to drink more than your body can handle (overdose). Alcohol overdose is a serious situation that requires  hospitalization. It may lead to permanent injuries or death. What can increase my risk? Having a family history of alcohol abuse. Having depression or other mental health conditions. Beginning to drink at an early age. Binge drinking often. Experiencing trauma, stress, and an unstable home life during childhood. Spending time with people who drink often. What actions can I take to prevent or manage alcohol abuse and dependence? Do not drink alcohol if: Your health care provider tells you not to drink. You are pregnant, may be pregnant, or are planning to become pregnant. If you drink alcohol: Limit how much you use to: 0-1 drink a day for women. 0-2 drinks a day for men. Be aware of how much alcohol is in your drink. In the [place] one drink equals one 12 oz bottle of beer (355 mL), one 5 oz glass of wine (148 mL), or one 1 oz glass of hard liquor (44 mL). Stop drinking if you have been drinking too much. This can be very hard to do if you are used to abusing alcohol. If you begin to have withdrawal symptoms, talk with your health care provider or a person that you trust. These symptoms may include anxiety, shaky hands, headache, nausea, sweating, or not being able to sleep. Choose to drink nonalcoholic beverages in social gatherings and places where there may be alcohol. Activity Spend more time on activities that you enjoy that do not involve alcohol, like hobbies or exercise. Find healthy ways to cope with stress, such as exercise, meditation, or spending time with people you care about. General information Talk to your family,   coworkers, and friends about supporting you in your efforts to stop drinking. If they drink, ask them not to drink around you. Spend more time with people who do not drink alcohol. If you think that you have an alcohol dependency problem: Tell friends or family about your concerns. Talk with your health care provider or another health professional about where to  get help. Work with a therapist and a chemical dependency counselor. Consider joining a support group for people who struggle with alcohol abuse and dependence. Where to find support  Your health care provider. SMART Recovery: www.smartrecovery.org Therapy and support groups Local treatment centers or chemical dependency counselors. Local AA groups in your community: www.aa.org Where to find more information Centers for Disease Control and Prevention: www.cdc.gov National Institute on Alcohol Abuse and Alcoholism: www.niaaa.nih.gov Alcoholics Anonymous (AA): www.aa.org Contact a health care provider if: You drank more or for longer than you intended on more than one occasion. You tried to stop drinking or to cut back on how much you drink, but you were not able to. You often drink to the point of vomiting or passing out. You want to drink so badly that you cannot think about anything else. You have problems in your life due to drinking, but you continue to drink. You keep drinking even though you feel anxious, depressed, or have experienced memory loss. You have stopped doing the things you used to enjoy in order to drink. You have to drink more than you used to in order to get the effect you want. You experience anxiety, sweating, nausea, shakiness, and trouble sleeping when you try to stop drinking. Get help right away if: You have thoughts about hurting yourself or others. You have serious withdrawal symptoms, including: Confusion. Racing heart. High blood pressure. Fever. If you ever feel like you may hurt yourself or others, or have thoughts about taking your own life, get help right away. You can go to your nearest emergency department or call: Your local emergency services (911 in the [place] A suicide crisis helpline, such as the National Suicide Prevention Lifeline at 1-800-273-8255 or 988 in the [place] This is open 24 hours a day. Summary Alcohol abuse and dependence can  have a negative effect on your life. Drinking too much or too often can lead to addiction. If you drink alcohol, limit how much you use. If you are having trouble keeping your drinking under control, find ways to change your behavior. Hobbies, calming activities, exercise, or support groups can help. If you feel you need help with changing your drinking habits, talk with your health care provider, a good friend, or a therapist, or go to an AA group. This information is not intended to replace advice given to you by your health care provider. Make sure you discuss any questions you have with your health care provider. Document Revised: 01/12/2021 Document Reviewed: 08/27/2018 Elsevier Patient Education  2022 Elsevier Inc.  

## 2021-07-02 LAB — CBC WITH DIFFERENTIAL/PLATELET
Basophils Absolute: 0 10*3/uL (ref 0.0–0.2)
Basos: 0 %
EOS (ABSOLUTE): 0.4 10*3/uL (ref 0.0–0.4)
Eos: 5 %
Hematocrit: 35.4 % (ref 34.0–46.6)
Hemoglobin: 11.4 g/dL (ref 11.1–15.9)
Immature Grans (Abs): 0 10*3/uL (ref 0.0–0.1)
Immature Granulocytes: 1 %
Lymphocytes Absolute: 1.3 10*3/uL (ref 0.7–3.1)
Lymphs: 18 %
MCH: 27.5 pg (ref 26.6–33.0)
MCHC: 32.2 g/dL (ref 31.5–35.7)
MCV: 85 fL (ref 79–97)
Monocytes Absolute: 0.9 10*3/uL (ref 0.1–0.9)
Monocytes: 12 %
Neutrophils Absolute: 4.8 10*3/uL (ref 1.4–7.0)
Neutrophils: 64 %
Platelets: 182 10*3/uL (ref 150–450)
RBC: 4.15 x10E6/uL (ref 3.77–5.28)
RDW: 14.9 % (ref 11.7–15.4)
WBC: 7.4 10*3/uL (ref 3.4–10.8)

## 2021-07-02 LAB — CMP14+EGFR
ALT: 11 IU/L (ref 0–32)
AST: 20 IU/L (ref 0–40)
Albumin/Globulin Ratio: 2.3 — ABNORMAL HIGH (ref 1.2–2.2)
Albumin: 4.2 g/dL (ref 3.7–4.7)
Alkaline Phosphatase: 110 IU/L (ref 44–121)
BUN/Creatinine Ratio: 26 (ref 12–28)
BUN: 20 mg/dL (ref 8–27)
Bilirubin Total: 0.2 mg/dL (ref 0.0–1.2)
CO2: 19 mmol/L — ABNORMAL LOW (ref 20–29)
Calcium: 9 mg/dL (ref 8.7–10.3)
Chloride: 103 mmol/L (ref 96–106)
Creatinine, Ser: 0.77 mg/dL (ref 0.57–1.00)
Globulin, Total: 1.8 g/dL (ref 1.5–4.5)
Glucose: 104 mg/dL — ABNORMAL HIGH (ref 70–99)
Potassium: 3.7 mmol/L (ref 3.5–5.2)
Sodium: 142 mmol/L (ref 134–144)
Total Protein: 6 g/dL (ref 6.0–8.5)
eGFR: 82 mL/min/{1.73_m2} (ref >59)

## 2021-07-05 LAB — URINE CULTURE

## 2021-07-08 DIAGNOSIS — R0789 Other chest pain: Secondary | ICD-10-CM | POA: Diagnosis not present

## 2021-07-08 DIAGNOSIS — E860 Dehydration: Secondary | ICD-10-CM | POA: Diagnosis not present

## 2021-07-08 DIAGNOSIS — R000 Tachycardia, unspecified: Secondary | ICD-10-CM | POA: Diagnosis not present

## 2021-07-08 DIAGNOSIS — F10130 Alcohol abuse with withdrawal, uncomplicated: Secondary | ICD-10-CM | POA: Diagnosis not present

## 2021-07-08 DIAGNOSIS — R079 Chest pain, unspecified: Secondary | ICD-10-CM | POA: Diagnosis not present

## 2021-07-08 DIAGNOSIS — Z743 Need for continuous supervision: Secondary | ICD-10-CM | POA: Diagnosis not present

## 2021-07-08 DIAGNOSIS — M549 Dorsalgia, unspecified: Secondary | ICD-10-CM | POA: Diagnosis not present

## 2021-07-09 DIAGNOSIS — I10 Essential (primary) hypertension: Secondary | ICD-10-CM | POA: Diagnosis not present

## 2021-07-09 DIAGNOSIS — F10939 Alcohol use, unspecified with withdrawal, unspecified: Secondary | ICD-10-CM | POA: Diagnosis not present

## 2021-07-09 DIAGNOSIS — N3000 Acute cystitis without hematuria: Secondary | ICD-10-CM | POA: Diagnosis not present

## 2021-07-09 DIAGNOSIS — R1013 Epigastric pain: Secondary | ICD-10-CM | POA: Diagnosis not present

## 2021-07-10 DIAGNOSIS — N3000 Acute cystitis without hematuria: Secondary | ICD-10-CM | POA: Diagnosis not present

## 2021-07-10 DIAGNOSIS — T50901A Poisoning by unspecified drugs, medicaments and biological substances, accidental (unintentional), initial encounter: Secondary | ICD-10-CM | POA: Diagnosis not present

## 2021-07-10 DIAGNOSIS — F10939 Alcohol use, unspecified with withdrawal, unspecified: Secondary | ICD-10-CM | POA: Diagnosis not present

## 2021-07-10 DIAGNOSIS — R1013 Epigastric pain: Secondary | ICD-10-CM | POA: Diagnosis not present

## 2021-07-10 DIAGNOSIS — I10 Essential (primary) hypertension: Secondary | ICD-10-CM | POA: Diagnosis not present

## 2021-07-11 DIAGNOSIS — N3000 Acute cystitis without hematuria: Secondary | ICD-10-CM | POA: Diagnosis not present

## 2021-07-11 DIAGNOSIS — R1013 Epigastric pain: Secondary | ICD-10-CM | POA: Diagnosis not present

## 2021-07-11 DIAGNOSIS — F10939 Alcohol use, unspecified with withdrawal, unspecified: Secondary | ICD-10-CM | POA: Diagnosis not present

## 2021-07-11 DIAGNOSIS — I10 Essential (primary) hypertension: Secondary | ICD-10-CM | POA: Diagnosis not present

## 2021-07-12 DIAGNOSIS — F10939 Alcohol use, unspecified with withdrawal, unspecified: Secondary | ICD-10-CM | POA: Diagnosis not present

## 2021-07-12 DIAGNOSIS — I10 Essential (primary) hypertension: Secondary | ICD-10-CM | POA: Diagnosis not present

## 2021-07-12 DIAGNOSIS — R1013 Epigastric pain: Secondary | ICD-10-CM | POA: Diagnosis not present

## 2021-07-12 DIAGNOSIS — N3000 Acute cystitis without hematuria: Secondary | ICD-10-CM | POA: Diagnosis not present

## 2021-07-13 DIAGNOSIS — R1013 Epigastric pain: Secondary | ICD-10-CM | POA: Diagnosis not present

## 2021-07-13 DIAGNOSIS — N3000 Acute cystitis without hematuria: Secondary | ICD-10-CM | POA: Diagnosis not present

## 2021-07-13 DIAGNOSIS — M545 Low back pain, unspecified: Secondary | ICD-10-CM | POA: Diagnosis not present

## 2021-07-13 DIAGNOSIS — J449 Chronic obstructive pulmonary disease, unspecified: Secondary | ICD-10-CM | POA: Diagnosis not present

## 2021-07-13 DIAGNOSIS — I10 Essential (primary) hypertension: Secondary | ICD-10-CM | POA: Diagnosis not present

## 2021-07-15 ENCOUNTER — Ambulatory Visit (INDEPENDENT_AMBULATORY_CARE_PROVIDER_SITE_OTHER): Payer: Medicare Other | Admitting: Family Medicine

## 2021-07-15 DIAGNOSIS — R3989 Other symptoms and signs involving the genitourinary system: Secondary | ICD-10-CM | POA: Diagnosis not present

## 2021-07-15 DIAGNOSIS — R1013 Epigastric pain: Secondary | ICD-10-CM | POA: Diagnosis not present

## 2021-07-15 DIAGNOSIS — I10 Essential (primary) hypertension: Secondary | ICD-10-CM | POA: Diagnosis not present

## 2021-07-15 DIAGNOSIS — F419 Anxiety disorder, unspecified: Secondary | ICD-10-CM | POA: Diagnosis not present

## 2021-07-15 DIAGNOSIS — Z87891 Personal history of nicotine dependence: Secondary | ICD-10-CM | POA: Diagnosis not present

## 2021-07-15 DIAGNOSIS — J449 Chronic obstructive pulmonary disease, unspecified: Secondary | ICD-10-CM | POA: Diagnosis not present

## 2021-07-15 DIAGNOSIS — G8929 Other chronic pain: Secondary | ICD-10-CM | POA: Diagnosis not present

## 2021-07-15 DIAGNOSIS — F10930 Alcohol use, unspecified with withdrawal, uncomplicated: Secondary | ICD-10-CM | POA: Diagnosis not present

## 2021-07-15 DIAGNOSIS — N3000 Acute cystitis without hematuria: Secondary | ICD-10-CM | POA: Diagnosis not present

## 2021-07-15 DIAGNOSIS — F32A Depression, unspecified: Secondary | ICD-10-CM | POA: Diagnosis not present

## 2021-07-15 DIAGNOSIS — M545 Low back pain, unspecified: Secondary | ICD-10-CM | POA: Diagnosis not present

## 2021-07-15 MED ORDER — SULFAMETHOXAZOLE-TRIMETHOPRIM 800-160 MG PO TABS: 1.0000 | ORAL_TABLET | Freq: Two times a day (BID) | ORAL | 0 refills | Status: AC

## 2021-07-15 NOTE — Progress Notes (Signed)
Telephone visit  Subjective: CC:UTI PCP: [name], FNP Stacy Dalton is a 71 y.o. female calls for telephone consult today. Patient provides verbal consent for consult held via phone.  Due to COVID-19 pandemic this visit was conducted virtually. This visit type was conducted due to national recommendations for restrictions regarding the COVID-19 Pandemic (e.g. social distancing, sheltering in place) in an effort to limit this patient's exposure and mitigate transmission in our community. All issues noted in this document were discussed and addressed.  A physical exam was not performed with this format.   Location of patient: home Location of provider: [facility] Others present for call: family member  87.  Dysuria Patient reports several days of dysuria.  No hematuria reported.  No fevers or vaginal discharge reported.  She is status post treat with both Keflex and Macrobid.  She had urine culture performed a couple of weeks ago which demonstrated no significant bacterial growth.  She is to see a urologist out in [place] but has not seen him in almost a decade.  She would like new referral.   ROS: Per HPI  Allergies  Allergen Reactions   Librium [Chlordiazepoxide]     Became unresponsive   Toradol [Ketorolac Tromethamine] Shortness Of Breath   Butalbital-Apap-Caffeine Other (See Comments)    jittery   Demerol Swelling   Esgic [Butalbital-Apap-Caffeine] Other (See Comments)    jittery   Iodine Other (See Comments)    bp bottomed out per pt several hours later; unsure if pre medicated in past with cm; done in [place]   Past Medical History:  Diagnosis Date   Chronic pain    COPD (chronic obstructive pulmonary disease) ([place])    told has copd, no current inhaler use   Cough    Depression    ETOH abuse    GERD (gastroesophageal reflux disease)    Hypertension    Hypothyroidism    Insomnia    Migraine    Migraine    Osteopenia    Pain management    Panic attacks     Small bowel obstruction (H[facility])     Current Outpatient Medications:    amLODipine (NORVASC) 5 MG tablet, Take 1 tablet (5 mg total) by mouth daily., Disp: 30 tablet, Rfl: 2   cephALEXin (KEFLEX) 500 MG capsule, Take 1 capsule (500 mg total) by mouth 2 (two) times daily., Disp: 14 capsule, Rfl: 0   fluticasone-salmeterol (ADVAIR DISKUS) 250-50 MCG/ACT AEPB, Inhale 1 puff into the lungs in the morning and at bedtime. (Patient not taking: Reported on 05/25/2021), Disp: 60 each, Rfl: 2   folic acid (FOLVITE) 1 MG tablet, Take 1 tablet (1 mg total) by mouth daily., Disp: 30 tablet, Rfl: 1   levothyroxine (SYNTHROID) 50 MCG tablet, Take 1 tablet (50 mcg total) by mouth daily before breakfast., Disp: 90 tablet, Rfl: 2   metoprolol tartrate (LOPRESSOR) 25 MG tablet, Take 0.5 tablets (12.5 mg total) by mouth 2 (two) times daily., Disp: 30 tablet, Rfl: 1   Multiple Vitamin (MULTIVITAMIN WITH MINERALS) TABS tablet, Take 1 tablet by mouth daily., Disp: 30 tablet, Rfl: 0   pantoprazole (PROTONIX) 40 MG tablet, Take 1 tablet (40 mg total) by mouth daily., Disp: 30 tablet, Rfl: 1   potassium chloride SA (KLOR-CON M) 20 MEQ tablet, Take 1 tablet (20 mEq total) by mouth 2 (two) times daily., Disp: 20 tablet, Rfl: 0   sertraline (ZOLOFT) 50 MG tablet, Take 1 tablet (50 mg total) by mouth at bedtime.,  Disp: 30 tablet, Rfl: 1   temazepam (RESTORIL) 15 MG capsule, Take 1 capsule (15 mg total) by mouth at bedtime as needed for sleep., Disp: 20 capsule, Rfl: 0   thiamine 100 MG tablet, Take 1 tablet (100 mg total) by mouth daily., Disp: 30 tablet, Rfl: 0   traMADol (ULTRAM) 50 MG tablet, Take 2 tablets (100 mg total) by mouth every 6 (six) hours as needed for severe pain. for pain, Disp: 30 tablet, Rfl: 0   vitamin B-12 (CYANOCOBALAMIN) 1000 MCG tablet, Take 1 tablet (1,000 mcg total) by mouth daily., Disp: 30 tablet, Rfl: 0   Vitamin D, Ergocalciferol, (DRISDOL) 1.25 MG (50000 UNIT) CAPS capsule, Take 1 capsule (50,000  Units total) by mouth every 7 (seven) days., Disp: 12 capsule, Rfl: 3  Assessment/ Plan: 71 y.o. female   Suspected UTI - Plan: Ambulatory referral to Urology, sulfamethoxazole-trimethoprim (BACTRIM DS) 800-160 MG tablet  I reviewed her last urine culture which demonstrated no significant bacterial growth on her urine culture.  I do wonder if perhaps she is having interstitial cystitis but cannot rule out UTI without recurrent urinalysis.  For this reason I am going to change the class of her medicine and place her on Bactrim twice daily for 3 days.  I offered Pyridium but apparently this causes nausea so have added that to her allergy list.  I have also placed a referral to urology.  She was previously seen at [facility] urology but prefers a [place] location.  Push oral fluids.  Follow-up as needed  Start time: 2:57pm End time: 3:02pm  Total time spent on patient care (including telephone call/ virtual visit): 5 minutes  [place], [place] [phone]

## 2021-07-18 DIAGNOSIS — R1013 Epigastric pain: Secondary | ICD-10-CM | POA: Diagnosis not present

## 2021-07-18 DIAGNOSIS — I10 Essential (primary) hypertension: Secondary | ICD-10-CM | POA: Diagnosis not present

## 2021-07-18 DIAGNOSIS — F10930 Alcohol use, unspecified with withdrawal, uncomplicated: Secondary | ICD-10-CM | POA: Diagnosis not present

## 2021-07-18 DIAGNOSIS — J449 Chronic obstructive pulmonary disease, unspecified: Secondary | ICD-10-CM | POA: Diagnosis not present

## 2021-07-18 DIAGNOSIS — N3000 Acute cystitis without hematuria: Secondary | ICD-10-CM | POA: Diagnosis not present

## 2021-07-18 DIAGNOSIS — G8929 Other chronic pain: Secondary | ICD-10-CM | POA: Diagnosis not present

## 2021-07-19 DIAGNOSIS — I10 Essential (primary) hypertension: Secondary | ICD-10-CM | POA: Diagnosis not present

## 2021-07-19 DIAGNOSIS — F10930 Alcohol use, unspecified with withdrawal, uncomplicated: Secondary | ICD-10-CM | POA: Diagnosis not present

## 2021-07-19 DIAGNOSIS — R1013 Epigastric pain: Secondary | ICD-10-CM | POA: Diagnosis not present

## 2021-07-19 DIAGNOSIS — J449 Chronic obstructive pulmonary disease, unspecified: Secondary | ICD-10-CM | POA: Diagnosis not present

## 2021-07-19 DIAGNOSIS — G8929 Other chronic pain: Secondary | ICD-10-CM | POA: Diagnosis not present

## 2021-07-19 DIAGNOSIS — N3000 Acute cystitis without hematuria: Secondary | ICD-10-CM | POA: Diagnosis not present

## 2021-07-20 ENCOUNTER — Encounter: Payer: Self-pay | Admitting: *Deleted

## 2021-07-21 ENCOUNTER — Telehealth: Payer: Self-pay

## 2021-07-21 NOTE — Chronic Care Management (AMB) (Signed)
°  Care Management   Note  07/21/2021 Name: Stacy Dalton MRN: [id number] DOB: 24-Nov-1950  Stacy Dalton is a 71 y.o. year old female who is a primary care patient of [name], FNP and is actively engaged with the care management team. I reached out to [name] by phone today to assist with re-scheduling a follow up visit with the Licensed Clinical Social Worker  Follow up plan: Unsuccessful telephone outreach attempt made. A HIPAA compliant phone message was left for the patient providing contact information and requesting a return call.  The care management team will reach out to the patient again over the next 7 days.  If patient returns call to provider office, please advise to call [facility]  at [address], [place] [address], [place], [place] [address] Direct Dial: [phone] [email or web address]

## 2021-07-22 ENCOUNTER — Encounter: Payer: Self-pay | Admitting: Family

## 2021-07-22 ENCOUNTER — Ambulatory Visit: Payer: Medicare Other | Admitting: Family

## 2021-07-23 ENCOUNTER — Emergency Department (HOSPITAL_COMMUNITY)
Admission: EM | Admit: 2021-07-23 | Discharge: 2021-07-24 | Disposition: A | Discharge status: Home / Self Care | Payer: Medicare Other | Attending: Emergency Medicine | Admitting: Emergency Medicine

## 2021-07-23 ENCOUNTER — Encounter (HOSPITAL_COMMUNITY): Payer: Self-pay | Admitting: Emergency Medicine

## 2021-07-23 DIAGNOSIS — F10120 Alcohol abuse with intoxication, uncomplicated: Secondary | ICD-10-CM | POA: Insufficient documentation

## 2021-07-23 DIAGNOSIS — Y909 Presence of alcohol in blood, level not specified: Secondary | ICD-10-CM | POA: Diagnosis not present

## 2021-07-23 DIAGNOSIS — F10920 Alcohol use, unspecified with intoxication, uncomplicated: Secondary | ICD-10-CM

## 2021-07-23 DIAGNOSIS — F1010 Alcohol abuse, uncomplicated: Secondary | ICD-10-CM

## 2021-07-23 DIAGNOSIS — Z79899 Other long term (current) drug therapy: Secondary | ICD-10-CM | POA: Insufficient documentation

## 2021-07-23 MED ORDER — ZIPRASIDONE HCL 20 MG PO CAPS
20.0000 mg | ORAL_CAPSULE | Freq: Once | ORAL | Status: AC
  Administered 2021-07-23: 20 mg via ORAL
  Filled 2021-07-23: qty 1

## 2021-07-23 NOTE — ED Notes (Signed)
Pt is agitated and screaming for help. Called pt ex husband Clemetine Marker) and he stated that he does not have any gas in his truck to pick her up.

## 2021-07-23 NOTE — ED Provider Notes (Signed)
Care assumed from Dr. Sabra Heck, patient with alcohol intoxication being observed in the emergency department until she is sober enough to go home safely.  Patient was observed overnight and was resting comfortably.  She is now awake and alert and safe for discharge.   Delora Fuel, MD 50/51/83 [phone]

## 2021-07-23 NOTE — ED Provider Notes (Signed)
[facility] EMERGENCY DEPARTMENT Provider Note   CSN: [id number] Arrival date & time: 07/23/21  2007     History  Chief Complaint  Patient presents with   Alcohol Intoxication    Stacy Dalton is a 71 y.o. female.   Alcohol Intoxication    Pt is a 71 y/o female -no known to this emergency department for her history of significant alcohol abuse, she is here not infrequently for the same, in fact review of the medical record shows that she was here 3 times during December, several times during November and continues to have the same problems.  Tonight 911 was called because the patient was rolling around in the dirt of her front yard after having too much to drink again.  She cannot tell me anymore about it, she denies having any specific pains or shortness of breath or fevers or vomiting or diarrhea, paramedics noted normal vital signs  Home Medications Prior to Admission medications   Medication Sig Start Date End Date Taking? Authorizing Provider  amLODipine (NORVASC) 5 MG tablet Take 1 tablet (5 mg total) by mouth daily. 05/22/21   Regalado, Belkys A, MD  cephALEXin (KEFLEX) 500 MG capsule Take 1 capsule (500 mg total) by mouth 2 (two) times daily. 07/01/21   Evelina Dun A, FNP  fluticasone-salmeterol (ADVAIR DISKUS) 250-50 MCG/ACT AEPB Inhale 1 puff into the lungs in the morning and at bedtime. Patient not taking: Reported on 05/25/2021 04/19/21 05/20/21  Deatra James, MD  folic acid (FOLVITE) 1 MG tablet Take 1 tablet (1 mg total) by mouth daily. 05/22/21   Regalado, Jerald Kief A, MD  levothyroxine (SYNTHROID) 50 MCG tablet Take 1 tablet (50 mcg total) by mouth daily before breakfast. 06/22/20   Evelina Dun A, FNP  metoprolol tartrate (LOPRESSOR) 25 MG tablet Take 0.5 tablets (12.5 mg total) by mouth 2 (two) times daily. 06/13/21 07/13/21  Barton Dubois, MD  Multiple Vitamin (MULTIVITAMIN WITH MINERALS) TABS tablet Take 1 tablet by mouth daily. 05/22/21   Regalado,  Belkys A, MD  pantoprazole (PROTONIX) 40 MG tablet Take 1 tablet (40 mg total) by mouth daily. 06/13/21 06/13/22  Barton Dubois, MD  potassium chloride SA (KLOR-CON M) 20 MEQ tablet Take 1 tablet (20 mEq total) by mouth 2 (two) times daily. 50/09/38   Delora Fuel, MD  sertraline (ZOLOFT) 50 MG tablet Take 1 tablet (50 mg total) by mouth at bedtime. 06/13/21   Barton Dubois, MD  temazepam (RESTORIL) 15 MG capsule Take 1 capsule (15 mg total) by mouth at bedtime as needed for sleep. 06/13/21   Barton Dubois, MD  thiamine 100 MG tablet Take 1 tablet (100 mg total) by mouth daily. 05/22/21   Regalado, Belkys A, MD  traMADol (ULTRAM) 50 MG tablet Take 2 tablets (100 mg total) by mouth every 6 (six) hours as needed for severe pain. for pain 06/13/21   Barton Dubois, MD  vitamin B-12 (CYANOCOBALAMIN) 1000 MCG tablet Take 1 tablet (1,000 mcg total) by mouth daily. 05/21/21   Regalado, Belkys A, MD  Vitamin D, Ergocalciferol, (DRISDOL) 1.25 MG (50000 UNIT) CAPS capsule Take 1 capsule (50,000 Units total) by mouth every 7 (seven) days. 06/22/20   [name], FNP      Allergies    Librium [chlordiazepoxide], Toradol [ketorolac tromethamine], Butalbital-apap-caffeine, Demerol, Esgic [butalbital-apap-caffeine], Iodine, and Pyridium [phenazopyridine]    Review of Systems   Review of Systems  All other systems reviewed and are negative.  Physical Exam Updated Vital Signs BP 125/65  Pulse 74    Temp 97.7 F (36.5 C) (Oral)    Resp 14    Ht 1.626 m (5\' 4" )    Wt 59 kg    SpO2 98%    BMI 22.31 kg/m  Physical Exam Vitals and nursing note reviewed.  Constitutional:      General: She is not in acute distress.    Appearance: She is well-developed.  HENT:     Head: Normocephalic and atraumatic.     Mouth/Throat:     Pharynx: No oropharyngeal exudate.  Eyes:     General: No scleral icterus.       Right eye: No discharge.        Left eye: No discharge.     Conjunctiva/sclera: Conjunctivae  normal.     Pupils: Pupils are equal, round, and reactive to light.  Neck:     Thyroid: No thyromegaly.     Vascular: No JVD.  Cardiovascular:     Rate and Rhythm: Normal rate and regular rhythm.     Heart sounds: Normal heart sounds. No murmur heard.   No friction rub. No gallop.  Pulmonary:     Effort: Pulmonary effort is normal. No respiratory distress.     Breath sounds: Normal breath sounds. No wheezing or rales.  Abdominal:     General: Bowel sounds are normal. There is no distension.     Palpations: Abdomen is soft. There is no mass.     Tenderness: There is no abdominal tenderness.  Musculoskeletal:        General: No tenderness. Normal range of motion.     Cervical back: Normal range of motion and neck supple.  Lymphadenopathy:     Cervical: No cervical adenopathy.  Skin:    General: Skin is warm and dry.     Findings: No erythema or rash.  Neurological:     Mental Status: She is alert.     Coordination: Coordination normal.     Comments: There is no facial droop, she is able to move all 4 extremities, she is answering questions, she is slightly slurring her words, she knows where she is, she knows her name, she knows her birthdate, she knows the circumstances  Psychiatric:        Behavior: Behavior normal.     Comments: No hallucinations, freely endorses alcohol abuse    ED Results / Procedures / Treatments   Labs (all labs ordered are listed, but only abnormal results are displayed) Labs Reviewed - No data to display  EKG None  Radiology No results found.  Procedures Procedures    Medications Ordered in ED Medications  ziprasidone (GEODON) capsule 20 mg (20 mg Oral Given 07/23/21 2135)    ED Course/ Medical Decision Making/ A&P                           Medical Decision Making Risk Prescription drug management.   Overall patient is at her baseline for ER visits appearing acutely intoxicated but not in distress, maintaining her airway without  difficulty not hypoxic tachycardic hypotensive or febrile.  ER observation and anticipate discharge  Pt has required some geodon - yelling and "screeching" - complaining about her alcohol abuse - she has no physical complaint.  At change of shift care signed out to Dr. Delora Fuel to follow-up after sobering up and disposition accordingly.  The patient has normal vital signs and a well-established history of alcohol abuse endorsing lots  of alcohol this evening.  She is not vomiting, she is not short of breath, she has not febrile, she is not seizing, I anticipate that she will be discharged        Final Clinical Impression(s) / ED Diagnoses Final diagnoses:  Alcoholic intoxication without complication ([place])  Alcohol abuse    Rx / DC Orders ED Discharge Orders     None         Noemi Chapel, MD 07/23/21 2258

## 2021-07-23 NOTE — ED Notes (Signed)
Assisted to restroom. Pt stand by assist due to intoxication. Requesting [place] call her bf

## 2021-07-23 NOTE — ED Triage Notes (Signed)
Pt brought in by [facility] from home for ETOH intoxication. Pt was found outside rolling around on the ground.

## 2021-07-23 NOTE — ED Notes (Signed)
Left VM on bf's phone

## 2021-07-24 DIAGNOSIS — F10120 Alcohol abuse with intoxication, uncomplicated: Secondary | ICD-10-CM | POA: Diagnosis not present

## 2021-07-24 MED ORDER — LORAZEPAM 0.5 MG PO TABS
0.5000 mg | ORAL_TABLET | Freq: Once | ORAL | Status: AC
  Administered 2021-07-24: 0.5 mg via SUBLINGUAL
  Filled 2021-07-24: qty 1

## 2021-07-24 NOTE — ED Notes (Signed)
Attempted to call pt boyfriend again with no answer.

## 2021-07-24 NOTE — ED Notes (Signed)
Safe transport contacted ETA 20 minutes to pick patient up and transport home.

## 2021-07-24 NOTE — ED Provider Notes (Signed)
Care of patient was assumed from Dr. Roxanne Mins at 8 AM.  This patient presented to the ED with alcohol intoxication.  She is currently sleeping.  When she wakes, reassess and she will be likely appropriate for discharge. Physical Exam  BP (!) 145/81    Pulse 98    Temp 97.7 F (36.5 C) (Oral)    Resp 18    Ht 5\' 4"  (1.626 m)    Wt 59 kg    SpO2 94%    BMI 22.31 kg/m   Physical Exam Constitutional:      General: She is not in acute distress.    Appearance: She is not ill-appearing, toxic-appearing or diaphoretic.  HENT:     Head: Normocephalic and atraumatic.     Right Ear: External ear normal.     Left Ear: External ear normal.  Eyes:     General: No scleral icterus.    Extraocular Movements: Extraocular movements intact.  Cardiovascular:     Rate and Rhythm: Normal rate and regular rhythm.  Pulmonary:     Effort: Pulmonary effort is normal. No respiratory distress.  Abdominal:     General: Abdomen is flat.     Palpations: Abdomen is soft.  Musculoskeletal:        General: Normal range of motion.     Cervical back: Normal range of motion. No rigidity.  Skin:    General: Skin is warm and dry.  Neurological:     General: No focal deficit present.     Mental Status: She is alert and oriented to person, place, and time.  Psychiatric:        Mood and Affect: Mood normal.        Behavior: Behavior normal.    Procedures  Procedures  ED Course / MDM    Medical Decision Making Risk Prescription drug management.   On assessment, patient awake and alert.  She denies any current pain.  She states that she does feel anxious.  She was offered food.  0.5 mg of Ativan was given.  Family members were contacted to arrange pickup.  Patient was discharged in stable condition.       Godfrey Pick, MD 07/25/21 [phone]

## 2021-07-24 NOTE — ED Notes (Signed)
Pt son called, no answer

## 2021-07-24 NOTE — ED Notes (Signed)
Spoke with sister whom states that she will pick up patient as long as she is agreeable to go to rehab. Patient spoke with her on phone and states that she will go. Sister to pick patient up shortly.

## 2021-07-24 NOTE — ED Notes (Signed)
Sister called and states that she will not be taking patient from ED because she cannot her into a rehab facility today. States that she will have to call a cab if she wants to go home. Have attempted several other contacts in chart with no success.

## 2021-07-24 NOTE — ED Notes (Signed)
Patient OOB ambulatory to bathroom at this time. Appears clinically sober. Drinking PO fluids at this time with no difficulty.

## 2021-07-24 NOTE — ED Notes (Signed)
[facility] to take patient home.

## 2021-07-24 NOTE — ED Notes (Signed)
Spoke with patients sister, pt sister refusing to come get her, stating she will tell her son [name] but refusing to give RN any information on how to get in contact with him. Informed her someone needs to come get this patient from the ED, the ED is not a holding facility and can not hold patients here and her sister is ready for discharge and we were unable to get any other family member to answer. Sister still refusing to come get patient, stating "I guess I was stupid enough to answer the phone."

## 2021-07-29 ENCOUNTER — Telehealth: Payer: Medicare Other

## 2021-07-29 DIAGNOSIS — I10 Essential (primary) hypertension: Secondary | ICD-10-CM | POA: Diagnosis not present

## 2021-07-29 DIAGNOSIS — R1013 Epigastric pain: Secondary | ICD-10-CM | POA: Diagnosis not present

## 2021-07-29 DIAGNOSIS — G8929 Other chronic pain: Secondary | ICD-10-CM | POA: Diagnosis not present

## 2021-07-29 DIAGNOSIS — J449 Chronic obstructive pulmonary disease, unspecified: Secondary | ICD-10-CM | POA: Diagnosis not present

## 2021-07-29 DIAGNOSIS — N3000 Acute cystitis without hematuria: Secondary | ICD-10-CM | POA: Diagnosis not present

## 2021-07-29 DIAGNOSIS — F10930 Alcohol use, unspecified with withdrawal, uncomplicated: Secondary | ICD-10-CM | POA: Diagnosis not present

## 2021-08-01 ENCOUNTER — Ambulatory Visit (INDEPENDENT_AMBULATORY_CARE_PROVIDER_SITE_OTHER): Payer: Medicare Other | Admitting: Nurse Practitioner

## 2021-08-01 ENCOUNTER — Telehealth: Payer: Self-pay | Admitting: Family

## 2021-08-01 ENCOUNTER — Encounter: Payer: Self-pay | Admitting: Nurse Practitioner

## 2021-08-01 DIAGNOSIS — R112 Nausea with vomiting, unspecified: Secondary | ICD-10-CM | POA: Diagnosis not present

## 2021-08-01 MED ORDER — ONDANSETRON HCL 4 MG PO TABS: 4.0000 mg | ORAL_TABLET | Freq: Three times a day (TID) | ORAL | 0 refills | Status: DC | PRN

## 2021-08-01 NOTE — Progress Notes (Signed)
° °  Virtual Visit  Note Due to COVID-19 pandemic this visit was conducted virtually. This visit type was conducted due to national recommendations for restrictions regarding the COVID-19 Pandemic (e.g. social distancing, sheltering in place) in an effort to limit this patient's exposure and mitigate transmission in our community. All issues noted in this document were discussed and addressed.  A physical exam was not performed with this format.  I connected with Stacy Dalton on 08/01/21 at 8:00 am by telephone and verified that I am speaking with the correct person using two identifiers. Stacy Dalton is currently located at home during visit. The provider, Ivy Lynn, NP is located in their office at time of visit.  I discussed the limitations, risks, security and privacy concerns of performing an evaluation and management service by telephone and the availability of in person appointments. I also discussed with the patient that there may be a patient responsible charge related to this service. The patient expressed understanding and agreed to proceed.   History and Present Illness:  Emesis  This is a new problem. The current episode started yesterday. The problem has been unchanged. The emesis has an appearance of stomach contents. There has been no fever. The fever has been present for 1 to 2 days. Associated symptoms include abdominal pain. Pertinent negatives include no chills, coughing, diarrhea, fever, headaches or sweats. Associated symptoms comments: Nausea. She has tried acetaminophen for the symptoms.     Review of Systems  Constitutional:  Negative for chills, fever and malaise/fatigue.  HENT: Negative.    Respiratory:  Negative for cough.   Cardiovascular: Negative.   Gastrointestinal:  Positive for abdominal pain, nausea and vomiting. Negative for diarrhea.  Skin:  Negative for rash.  Neurological:  Negative for headaches.    Observations/Objective: Televisit  patient not in distress.  Assessment and Plan: Take meds as prescribed -Force fluids -For fever or aches or pains- take Tylenol or ibuprofen. -Zofran 4 mg tablet by mouth for nausea. -If symptoms do not improve, she may need to be COVID tested to rule this out   Follow Up Instructions: Follow-up with worsening unresolved symptoms.    I discussed the assessment and treatment plan with the patient. The patient was provided an opportunity to ask questions and all were answered. The patient agreed with the plan and demonstrated an understanding of the instructions.   The patient was advised to call back or seek an in-person evaluation if the symptoms worsen or if the condition fails to improve as anticipated.  The above assessment and management plan was discussed with the patient. The patient verbalized understanding of and has agreed to the management plan. Patient is aware to call the clinic if symptoms persist or worsen. Patient is aware when to return to the clinic for a follow-up visit. Patient educated on when it is appropriate to go to the emergency department.   Time call ended:  8:09 am   I provided 9 minutes of  non face-to-face time during this encounter.    Ivy Lynn, NP

## 2021-08-02 NOTE — Telephone Encounter (Signed)
I would consider an inpatient treatment. Do we have one locally? How do I place referral?  Do they accept medicare?

## 2021-08-02 NOTE — Telephone Encounter (Signed)
TC w/ [facility] nurse. They did start of care about 4 weeks ago, pt was not drinking then. She is now heavily drinking. Son reports that she started back about 2 weeks ago. She has been confrontational, even with the neighbor. [place] nurse saw her Friday. She went to jail this weekend. She did a drop by visit yesterday there were 6 empty liquor bottles in the home. [place] nurse considered calling APS but she is cognitively intake. HH has had to discharge her. Wondering if PCP would consider Inpatient treatment facility.

## 2021-08-04 ENCOUNTER — Telehealth: Payer: Self-pay | Admitting: Family

## 2021-08-05 ENCOUNTER — Other Ambulatory Visit: Payer: Self-pay | Admitting: Nurse Practitioner

## 2021-08-05 DIAGNOSIS — R112 Nausea with vomiting, unspecified: Secondary | ICD-10-CM

## 2021-08-05 MED ORDER — BENZONATATE 100 MG PO CAPS: 100.0000 mg | ORAL_CAPSULE | Freq: Three times a day (TID) | ORAL | 0 refills | Status: DC | PRN

## 2021-08-05 MED ORDER — ONDANSETRON HCL 4 MG PO TABS: 4.0000 mg | ORAL_TABLET | Freq: Three times a day (TID) | ORAL | 0 refills | Status: DC | PRN

## 2021-08-05 NOTE — Telephone Encounter (Signed)
No answer, no voicemail.

## 2021-08-08 ENCOUNTER — Encounter: Payer: Self-pay | Admitting: Family

## 2021-08-08 ENCOUNTER — Ambulatory Visit (INDEPENDENT_AMBULATORY_CARE_PROVIDER_SITE_OTHER): Payer: Medicare Other | Admitting: Family

## 2021-08-08 DIAGNOSIS — J449 Chronic obstructive pulmonary disease, unspecified: Secondary | ICD-10-CM | POA: Diagnosis not present

## 2021-08-08 DIAGNOSIS — E039 Hypothyroidism, unspecified: Secondary | ICD-10-CM | POA: Diagnosis not present

## 2021-08-08 DIAGNOSIS — F1010 Alcohol abuse, uncomplicated: Secondary | ICD-10-CM

## 2021-08-08 DIAGNOSIS — G4700 Insomnia, unspecified: Secondary | ICD-10-CM

## 2021-08-08 DIAGNOSIS — R112 Nausea with vomiting, unspecified: Secondary | ICD-10-CM

## 2021-08-08 DIAGNOSIS — K219 Gastro-esophageal reflux disease without esophagitis: Secondary | ICD-10-CM | POA: Diagnosis not present

## 2021-08-08 DIAGNOSIS — E785 Hyperlipidemia, unspecified: Secondary | ICD-10-CM

## 2021-08-08 DIAGNOSIS — I10 Essential (primary) hypertension: Secondary | ICD-10-CM | POA: Diagnosis not present

## 2021-08-08 DIAGNOSIS — F321 Major depressive disorder, single episode, moderate: Secondary | ICD-10-CM

## 2021-08-08 DIAGNOSIS — F419 Anxiety disorder, unspecified: Secondary | ICD-10-CM

## 2021-08-08 MED ORDER — DULOXETINE HCL 60 MG PO CPEP: 60.0000 mg | ORAL_CAPSULE | Freq: Every day | ORAL | 1 refills | Status: DC

## 2021-08-08 MED ORDER — ONDANSETRON HCL 4 MG PO TABS: 4.0000 mg | ORAL_TABLET | Freq: Three times a day (TID) | ORAL | 1 refills | Status: DC | PRN

## 2021-08-08 NOTE — Progress Notes (Signed)
Virtual Visit  Note Due to COVID-19 pandemic this visit was conducted virtually. This visit type was conducted due to national recommendations for restrictions regarding the COVID-19 Pandemic (e.g. social distancing, sheltering in place) in an effort to limit this patient's exposure and mitigate transmission in our community. All issues noted in this document were discussed and addressed.  A physical exam was not performed with this format.  I connected with Stacy Dalton on 08/08/21 at 1:52 pm  by telephone and verified that I am speaking with the correct person using two identifiers. Stacy Dalton is currently located at home and no one is currently with her during visit. The provider, Evelina Dun, FNP is located in their office at time of visit.  I discussed the limitations, risks, security and privacy concerns of performing an evaluation and management service by telephone and the availability of in person appointments. I also discussed with the patient that there may be a patient responsible charge related to this service. The patient expressed understanding and agreed to proceed.  Ms. omolola, mittman are scheduled for a virtual visit with your provider today.    Just as we do with appointments in the office, we must obtain your consent to participate.  Your consent will be active for this visit and any virtual visit you may have with one of our providers in the next 365 days.    If you have a MyChart account, I can also send a copy of this consent to you electronically.  All virtual visits are billed to your insurance company just like a traditional visit in the office.  As this is a virtual visit, video technology does not allow for your provider to perform a traditional examination.  This may limit your provider's ability to fully assess your condition.  If your provider identifies any concerns that need to be evaluated in person or the need to arrange testing such as labs, EKG, etc, we  will make arrangements to do so.    Although advances in technology are sophisticated, we cannot ensure that it will always work on either your end or our end.  If the connection with a video visit is poor, we may have to switch to a telephone visit.  With either a video or telephone visit, we are not always able to ensure that we have a secure connection.   I need to obtain your verbal consent now.   Are you willing to proceed with your visit today?   Stacy Dalton has provided verbal consent on 08/08/2021 for a virtual visit (video or telephone).   Evelina Dun, [place] 08/08/2021  2:46 PM     History and Present Illness:  Pt calls the office today for chronic follow up. PT has pulmonologists for COPD and chronic cough, but states she has not seen them since COVID. She reports she has SOB with exertion, but this is stable.   She has hx of alcohol abuse and has had multiple hospital admissions. She reports she has not drink since 07/23/21. She reports she is doing much better physically and mentally since stopping alcohol.  Hypertension This is a chronic problem. The current episode started more than 1 year ago. The problem has been resolved since onset. The problem is controlled. Associated symptoms include anxiety, malaise/fatigue and shortness of breath. Pertinent negatives include no peripheral edema. Risk factors for coronary artery disease include dyslipidemia. The current treatment provides moderate improvement. Identifiable causes of hypertension include a thyroid problem.  Thyroid Problem Presents for follow-up visit. Symptoms include anxiety, depressed mood and fatigue. Patient reports no constipation or diarrhea. The symptoms have been stable. Her past medical history is significant for hyperlipidemia.  Insomnia Primary symptoms: difficulty falling asleep, frequent awakening, malaise/fatigue.   The current episode started more than one year. The onset quality is gradual. The problem  occurs intermittently. PMH includes: depression.   Hyperlipidemia This is a chronic problem. The current episode started more than 1 year ago. The problem is controlled. Recent lipid tests were reviewed and are normal. Associated symptoms include shortness of breath. Current antihyperlipidemic treatment includes statins. The current treatment provides moderate improvement of lipids. Risk factors for coronary artery disease include dyslipidemia, hypertension, a sedentary lifestyle and post-menopausal.  Depression        This is a chronic problem.  The current episode started more than 1 year ago.   The onset quality is gradual.   Associated symptoms include fatigue, helplessness, hopelessness, insomnia, irritable, restlessness and sad.  Past treatments include nothing.  Compliance with treatment is good.  Past medical history includes thyroid problem and anxiety.   Anxiety Presents for follow-up visit. Symptoms include depressed mood, excessive worry, insomnia, irritability, nervous/anxious behavior, restlessness and shortness of breath. Symptoms occur occasionally. The severity of symptoms is moderate.    Back Pain This is a chronic problem. The current episode started more than 1 year ago. The problem occurs intermittently. The pain is present in the lumbar spine. The quality of the pain is described as aching. The pain is at a severity of 8/10. The pain is moderate. The treatment provided mild relief.     Review of Systems  Constitutional:  Positive for fatigue, irritability and malaise/fatigue.  Respiratory:  Positive for shortness of breath.   Gastrointestinal:  Negative for constipation and diarrhea.  Musculoskeletal:  Positive for back pain.  Psychiatric/Behavioral:  Positive for depression. The patient is nervous/anxious and has insomnia.     Observations/Objective: No SOB or distress noted   Assessment and Plan: No SOB or distress noted   Follow Up Instructions: Stacy Dalton  comes in today with chief complaint of No chief complaint on file.   Diagnosis and orders addressed:  1. Primary hypertension  2. Chronic obstructive pulmonary disease, unspecified COPD type ([place])  3. Gastroesophageal reflux disease without esophagitis  4. Hypothyroidism, unspecified type  5. Alcohol abuse  6. Anxiety Restart Cymbalta 60 mg  - DULoxetine (CYMBALTA) 60 MG capsule; Take 1 capsule (60 mg total) by mouth daily.  Dispense: 90 capsule; Refill: 1  7. Current moderate episode of major depressive disorder, unspecified whether recurrent (HCC) - DULoxetine (CYMBALTA) 60 MG capsule; Take 1 capsule (60 mg total) by mouth daily.  Dispense: 90 capsule; Refill: 1  8. Hyperlipidemia, unspecified hyperlipidemia type  9. Insomnia, unspecified type  10. Nausea and vomiting, unspecified vomiting type - ondansetron (ZOFRAN) 4 MG tablet; Take 1 tablet (4 mg total) by mouth every 8 (eight) hours as needed for nausea or vomiting.  Dispense: 60 tablet; Refill: 1  Health Maintenance reviewed Diet and exercise encouraged  Follow up plan: 3 months       I discussed the assessment and treatment plan with the patient. The patient was provided an opportunity to ask questions and all were answered. The patient agreed with the plan and demonstrated an understanding of the instructions.   The patient was advised to call back or seek an in-person evaluation if the symptoms worsen or if the condition fails  to improve as anticipated.  The above assessment and management plan was discussed with the patient. The patient verbalized understanding of and has agreed to the management plan. Patient is aware to call the clinic if symptoms persist or worsen. Patient is aware when to return to the clinic for a follow-up visit. Patient educated on when it is appropriate to go to the emergency department.   Time call ended: 2:14 pm     I provided 22 minutes of  non face-to-face time during this  encounter.    Evelina Dun, FNP

## 2021-08-08 NOTE — Telephone Encounter (Signed)
Visit with [facility] today.

## 2021-08-09 ENCOUNTER — Telehealth: Payer: Self-pay | Admitting: Family

## 2021-08-09 NOTE — Telephone Encounter (Signed)
Patient aware and verbalized understanding. °

## 2021-08-09 NOTE — Telephone Encounter (Signed)
°  Prescription Request  08/09/2021  Is this a "Controlled Substance" medicine? yes  Have you seen your PCP in the last 2 weeks? yes  If YES, route message to pool  -  If NO, patient needs to be scheduled for appointment.  What is the name of the medication or equipment? Temazepam . Had televisit with Christy yesterday and forgot to tell her she was not sleeping at night. Not showing on RX list.  Have you contacted your pharmacy to request a refill? NO   Which pharmacy would you like this sent to? [facility] in [place]   Patient notified that their request is being sent to the clinical staff for review and that they should receive a response within 2 business days.

## 2021-08-09 NOTE — Telephone Encounter (Signed)
In corresponding w/ Loma Sousa, she is pretty sure for inpatient therapy the patient will have to do the treatment, she does not think that referrals are sent for inpatient treatment

## 2021-08-09 NOTE — Telephone Encounter (Signed)
This is a controlled medication. Given recent alcohol abuse, we can not prescribe this. She can use OTC medications. I am sorry.   Evelina Dun, FNP

## 2021-08-20 ENCOUNTER — Emergency Department (HOSPITAL_COMMUNITY): Payer: Medicare Other

## 2021-08-20 ENCOUNTER — Encounter (HOSPITAL_COMMUNITY): Payer: Self-pay

## 2021-08-20 ENCOUNTER — Emergency Department (HOSPITAL_COMMUNITY)
Admission: EM | Admit: 2021-08-20 | Discharge: 2021-08-21 | Disposition: A | Discharge status: Home / Self Care | Payer: Medicare Other | Attending: Emergency Medicine | Admitting: Emergency Medicine

## 2021-08-20 DIAGNOSIS — J441 Chronic obstructive pulmonary disease with (acute) exacerbation: Secondary | ICD-10-CM

## 2021-08-20 DIAGNOSIS — Z7952 Long term (current) use of systemic steroids: Secondary | ICD-10-CM | POA: Insufficient documentation

## 2021-08-20 DIAGNOSIS — Z7951 Long term (current) use of inhaled steroids: Secondary | ICD-10-CM | POA: Insufficient documentation

## 2021-08-20 DIAGNOSIS — M79606 Pain in leg, unspecified: Secondary | ICD-10-CM | POA: Insufficient documentation

## 2021-08-20 DIAGNOSIS — J449 Chronic obstructive pulmonary disease, unspecified: Secondary | ICD-10-CM | POA: Diagnosis not present

## 2021-08-20 DIAGNOSIS — Z79899 Other long term (current) drug therapy: Secondary | ICD-10-CM | POA: Insufficient documentation

## 2021-08-20 DIAGNOSIS — F10129 Alcohol abuse with intoxication, unspecified: Secondary | ICD-10-CM | POA: Insufficient documentation

## 2021-08-20 DIAGNOSIS — R079 Chest pain, unspecified: Secondary | ICD-10-CM | POA: Diagnosis not present

## 2021-08-20 DIAGNOSIS — H9202 Otalgia, left ear: Secondary | ICD-10-CM | POA: Diagnosis not present

## 2021-08-20 DIAGNOSIS — J9811 Atelectasis: Secondary | ICD-10-CM | POA: Diagnosis not present

## 2021-08-20 DIAGNOSIS — I10 Essential (primary) hypertension: Secondary | ICD-10-CM | POA: Diagnosis not present

## 2021-08-20 DIAGNOSIS — F10920 Alcohol use, unspecified with intoxication, uncomplicated: Secondary | ICD-10-CM

## 2021-08-20 DIAGNOSIS — H9209 Otalgia, unspecified ear: Secondary | ICD-10-CM | POA: Insufficient documentation

## 2021-08-20 DIAGNOSIS — R0602 Shortness of breath: Secondary | ICD-10-CM | POA: Diagnosis not present

## 2021-08-20 DIAGNOSIS — Z20822 Contact with and (suspected) exposure to covid-19: Secondary | ICD-10-CM | POA: Diagnosis not present

## 2021-08-20 LAB — CBC WITH DIFFERENTIAL/PLATELET
Abs Immature Granulocytes: 0.05 10*3/uL (ref 0.00–0.07)
Basophils Absolute: 0.1 10*3/uL (ref 0.0–0.1)
Basophils Relative: 1 %
Eosinophils Absolute: 0.1 10*3/uL (ref 0.0–0.5)
Eosinophils Relative: 1 %
HCT: 38.3 % (ref 36.0–46.0)
Hemoglobin: 12.6 g/dL (ref 12.0–15.0)
Immature Granulocytes: 0 %
Lymphocytes Relative: 13 %
Lymphs Abs: 2.3 10*3/uL (ref 0.7–4.0)
MCH: 26.9 pg (ref 26.0–34.0)
MCHC: 32.9 g/dL (ref 30.0–36.0)
MCV: 81.7 fL (ref 80.0–100.0)
Monocytes Absolute: 1 10*3/uL (ref 0.1–1.0)
Monocytes Relative: 6 %
Neutro Abs: 13.5 10*3/uL — ABNORMAL HIGH (ref 1.7–7.7)
Neutrophils Relative %: 79 %
Platelets: 390 10*3/uL (ref 150–400)
RBC: 4.69 MIL/uL (ref 3.87–5.11)
RDW: 17.9 % — ABNORMAL HIGH (ref 11.5–15.5)
WBC: 17 10*3/uL — ABNORMAL HIGH (ref 4.0–10.5)
nRBC: 0 % (ref 0.0–0.2)

## 2021-08-20 NOTE — ED Provider Notes (Signed)
[facility] EMERGENCY DEPARTMENT Provider Note   CSN: [id number] Arrival date & time: 08/20/21  2230     History  Chief Complaint  Patient presents with   Alcohol Intoxication    Stacy Dalton is a 71 y.o. female.  Patient brought to the emergency department with multiple complaints.  Patient initially called out to EMS for chest pain.  EMS found her intoxicated.  She initially was complaining of ear pain, then leg pain, now shortness of breath upon arrival to the ER.      Home Medications Prior to Admission medications   Medication Sig Start Date End Date Taking? Authorizing Provider  albuterol (VENTOLIN HFA) 108 (90 Base) MCG/ACT inhaler Inhale 2 puffs into the lungs every 4 (four) hours as needed for wheezing or shortness of breath. 08/21/21  Yes Othel Hoogendoorn, Gwenyth Allegra, MD  doxycycline (VIBRAMYCIN) 100 MG capsule Take 1 capsule (100 mg total) by mouth 2 (two) times daily. 08/21/21  Yes Millee Denise, Gwenyth Allegra, MD  predniSONE (DELTASONE) 20 MG tablet Take 2 tablets (40 mg total) by mouth daily with breakfast. 08/21/21  Yes Kelwin Gibler, Gwenyth Allegra, MD  amLODipine (NORVASC) 5 MG tablet Take 1 tablet (5 mg total) by mouth daily. 05/22/21   Regalado, Belkys A, MD  DULoxetine (CYMBALTA) 60 MG capsule Take 1 capsule (60 mg total) by mouth daily. 08/08/21   Evelina Dun A, FNP  fluticasone-salmeterol (ADVAIR DISKUS) 250-50 MCG/ACT AEPB Inhale 1 puff into the lungs in the morning and at bedtime. Patient not taking: Reported on 05/25/2021 04/19/21 05/20/21  Deatra James, MD  folic acid (FOLVITE) 1 MG tablet Take 1 tablet (1 mg total) by mouth daily. 05/22/21   Regalado, Jerald Kief A, MD  levothyroxine (SYNTHROID) 50 MCG tablet Take 1 tablet (50 mcg total) by mouth daily before breakfast. 06/22/20   [name], FNP  Multiple Vitamin (MULTIVITAMIN WITH MINERALS) TABS tablet Take 1 tablet by mouth daily. 05/22/21   Regalado, Belkys A, MD  ondansetron (ZOFRAN) 4 MG tablet Take 1 tablet  (4 mg total) by mouth every 8 (eight) hours as needed for nausea or vomiting. 08/08/21   Evelina Dun A, FNP  pantoprazole (PROTONIX) 40 MG tablet Take 1 tablet (40 mg total) by mouth daily. 06/13/21 06/13/22  Barton Dubois, MD  potassium chloride SA (KLOR-CON M) 20 MEQ tablet Take 1 tablet (20 mEq total) by mouth 2 (two) times daily. 62/37/62   Delora Fuel, MD  thiamine 100 MG tablet Take 1 tablet (100 mg total) by mouth daily. 05/22/21   Regalado, Belkys A, MD  vitamin B-12 (CYANOCOBALAMIN) 1000 MCG tablet Take 1 tablet (1,000 mcg total) by mouth daily. 05/21/21   Regalado, Belkys A, MD  Vitamin D, Ergocalciferol, (DRISDOL) 1.25 MG (50000 UNIT) CAPS capsule Take 1 capsule (50,000 Units total) by mouth every 7 (seven) days. 06/22/20   [name], FNP      Allergies    Librium [chlordiazepoxide], Toradol [ketorolac tromethamine], Butalbital-apap-caffeine, Demerol, Esgic [butalbital-apap-caffeine], Iodine, and Pyridium [phenazopyridine]    Review of Systems   Review of Systems  Respiratory:  Positive for shortness of breath.   Cardiovascular:  Positive for chest pain.   Physical Exam Updated Vital Signs BP (!) 148/78    Pulse 95    Resp 19    Ht 5\' 4"  (1.626 m)    Wt 59 kg    SpO2 96%    BMI 22.31 kg/m  Physical Exam Vitals and nursing note reviewed.  Constitutional:  General: She is not in acute distress.    Appearance: She is well-developed.  HENT:     Head: Normocephalic and atraumatic.     Mouth/Throat:     Mouth: Mucous membranes are moist.  Eyes:     General: Vision grossly intact. Gaze aligned appropriately.     Extraocular Movements: Extraocular movements intact.     Conjunctiva/sclera: Conjunctivae normal.  Cardiovascular:     Rate and Rhythm: Normal rate and regular rhythm.     Pulses: Normal pulses.     Heart sounds: Normal heart sounds, S1 normal and S2 normal. No murmur heard.   No friction rub. No gallop.  Pulmonary:     Effort: Pulmonary effort is  normal. No respiratory distress.     Breath sounds: Normal breath sounds.  Abdominal:     General: Bowel sounds are normal.     Palpations: Abdomen is soft.     Tenderness: There is no abdominal tenderness. There is no guarding or rebound.     Hernia: No hernia is present.  Musculoskeletal:        General: No swelling.     Cervical back: Full passive range of motion without pain, normal range of motion and neck supple. No spinous process tenderness or muscular tenderness. Normal range of motion.     Right lower leg: No edema.     Left lower leg: No edema.  Skin:    General: Skin is warm and dry.     Capillary Refill: Capillary refill takes less than 2 seconds.     Findings: No ecchymosis, erythema, rash or wound.  Neurological:     General: No focal deficit present.     Mental Status: She is alert and oriented to person, place, and time.     GCS: GCS eye subscore is 4. GCS verbal subscore is 5. GCS motor subscore is 6.     Cranial Nerves: Cranial nerves 2-12 are intact.     Sensory: Sensation is intact.     Motor: Motor function is intact.     Coordination: Coordination is intact.  Psychiatric:        Attention and Perception: Attention normal.        Mood and Affect: Mood normal.        Speech: Speech normal.        Behavior: Behavior normal.    ED Results / Procedures / Treatments   Labs (all labs ordered are listed, but only abnormal results are displayed) Labs Reviewed  CBC WITH DIFFERENTIAL/PLATELET - Abnormal; Notable for the following components:      Result Value   WBC 17.0 (*)    RDW 17.9 (*)    Neutro Abs 13.5 (*)    All other components within normal limits  COMPREHENSIVE METABOLIC PANEL - Abnormal; Notable for the following components:   Glucose, Bld 116 (*)    Calcium 8.4 (*)    Total Protein 6.3 (*)    Albumin 3.3 (*)    All other components within normal limits  BRAIN NATRIURETIC PEPTIDE  TROPONIN I (HIGH SENSITIVITY)  TROPONIN I (HIGH SENSITIVITY)     EKG EKG Interpretation  Date/Time:  Saturday August 20 2021 23:25:45 EST Ventricular Rate:  86 PR Interval:  125 QRS Duration: 90 QT Interval:  385 QTC Calculation: 464 R Axis:   88 Text Interpretation: Ectopic atrial rhythm Anteroseptal infarct, age indeterminate Nonspecific ST abnormality Confirmed by Orpah Greek [phone]) on 08/21/2021 1:14:41 AM  Radiology CT Angio Chest  Pulmonary Embolism (PE) W or WO Contrast  Result Date: 08/21/2021 CLINICAL DATA:  71 year old female with chest pain. EXAM: CT ANGIOGRAPHY CHEST WITH CONTRAST TECHNIQUE: Multidetector CT imaging of the chest was performed using the standard protocol during bolus administration of intravenous contrast. Multiplanar CT image reconstructions and MIPs were obtained to evaluate the vascular anatomy. RADIATION DOSE REDUCTION: This exam was performed according to the departmental dose-optimization program which includes automated exposure control, adjustment of the mA and/or kV according to patient size and/or use of iterative reconstruction technique. CONTRAST:  32mL OMNIPAQUE IOHEXOL 350 MG/ML SOLN COMPARISON:  Portable chest 08/20/2021. Chest CT 02/28/2021. FINDINGS: Cardiovascular: Good contrast bolus timing in the pulmonary arterial tree. Mild respiratory motion at the lung bases. No focal filling defect identified in the pulmonary arteries to suggest acute pulmonary embolism. Extensive Calcified aortic atherosclerosis. 4 vessel arch configuration (normal variant). Calcified coronary artery atherosclerosis. Stable heart size, no significant cardiomegaly. No pericardial effusion. Mediastinum/Nodes: No mediastinal mass or lymphadenopathy. But there is abnormal circumferential wall thickening of the thoracic esophagus which appears indistinct (series 5, image 288). Lungs/Pleura: Lower lung volumes and atelectatic changes to the major airways which remain patent. Linear chronic scarring in the anteromedial right upper  lobe. Minor atelectasis. No pleural effusion or other abnormal pulmonary opacity. Upper Abdomen: Grossly negative visible liver, spleen, pancreas, adrenal glands, left kidney and bowel in the upper abdomen. Musculoskeletal: No acute osseous abnormality identified. Review of the MIP images confirms the above findings. IMPRESSION: 1. Negative for acute pulmonary embolus. 2. Evidence of Acute Esophagitis, with abnormal circumferential mural thickening most pronounced in the distal esophagus. 3. No other acute finding in the chest. 4. Calcified coronary artery and Aortic Atherosclerosis (ICD10-I70.0). Electronically Signed   By: Genevie Ann M.D.   On: 08/21/2021 05:15   DG Chest Port 1 View  Result Date: 08/20/2021 CLINICAL DATA:  Chest pain and shortness of breath. EXAM: PORTABLE CHEST 1 VIEW COMPARISON:  Radiograph 06/28/2021, chest CT 08/19/2020 FINDINGS: Stable heart size and mediastinal contours. Aortic atherosclerosis. Chronic interstitial coarsening without confluent consolidation no pulmonary edema, pleural effusion, or pneumothorax. No acute osseous abnormalities are seen. IMPRESSION: No acute chest findings. Electronically Signed   By: Keith Rake M.D.   On: 08/20/2021 23:28    Procedures Procedures    Medications Ordered in ED Medications  iohexol (OMNIPAQUE) 350 MG/ML injection 75 mL (75 mLs Intravenous Contrast Given 08/21/21 0454)    ED Course/ Medical Decision Making/ A&P                           Medical Decision Making Amount and/or Complexity of Data Reviewed Labs: ordered. Radiology: ordered.  Risk Prescription drug management.   Patient presents to emergency department with multiple complaints.  Patient is somewhat intoxicated on arrival.  She has complaint of various things such as ear pain, leg pain, chest pain, shortness of breath.  Patient does have a history of COPD.  She was mildly hypoxic at arrival, likely multifactorial secondary to her intoxication as well as her  COPD.  Cardiac evaluation is negative, no ischemia on EKG, no troponin elevation.  No evidence of congestive heart failure.  PE study without evidence of PE or pneumonia.  Patient has been monitored overnight.  She has done well.  Will discharge with treatment for COPD exacerbation.        Final Clinical Impression(s) / ED Diagnoses Final diagnoses:  Alcoholic intoxication without complication (HCC)  Chronic obstructive pulmonary  disease with acute exacerbation ([place])    Rx / DC Orders ED Discharge Orders          Ordered    albuterol (VENTOLIN HFA) 108 (90 Base) MCG/ACT inhaler  Every 4 hours PRN        08/21/21 0521    predniSONE (DELTASONE) 20 MG tablet  Daily with breakfast        08/21/21 0521    doxycycline (VIBRAMYCIN) 100 MG capsule  2 times daily        08/21/21 0521              Orpah Greek, MD 08/21/21 [phone]

## 2021-08-20 NOTE — ED Triage Notes (Signed)
Patient brought in by EMS stating "I want to die". EMS called out for chest pain however on arrival pain moved from her ear to her leg. Patient admits to drinking vodka of an unknown amount.

## 2021-08-21 ENCOUNTER — Emergency Department (HOSPITAL_COMMUNITY)
Admission: EM | Admit: 2021-08-21 | Discharge: 2021-08-21 | Disposition: A | Discharge status: Home / Self Care | Payer: Medicare Other | Source: Home / Self Care | Attending: Emergency Medicine | Admitting: Emergency Medicine

## 2021-08-21 ENCOUNTER — Emergency Department (HOSPITAL_COMMUNITY): Payer: Medicare Other

## 2021-08-21 ENCOUNTER — Encounter (HOSPITAL_COMMUNITY): Payer: Self-pay

## 2021-08-21 ENCOUNTER — Other Ambulatory Visit: Payer: Self-pay

## 2021-08-21 DIAGNOSIS — R0602 Shortness of breath: Secondary | ICD-10-CM | POA: Diagnosis not present

## 2021-08-21 DIAGNOSIS — Z7951 Long term (current) use of inhaled steroids: Secondary | ICD-10-CM | POA: Insufficient documentation

## 2021-08-21 DIAGNOSIS — H9202 Otalgia, left ear: Secondary | ICD-10-CM

## 2021-08-21 DIAGNOSIS — J449 Chronic obstructive pulmonary disease, unspecified: Secondary | ICD-10-CM | POA: Insufficient documentation

## 2021-08-21 DIAGNOSIS — Z79899 Other long term (current) drug therapy: Secondary | ICD-10-CM | POA: Insufficient documentation

## 2021-08-21 DIAGNOSIS — I10 Essential (primary) hypertension: Secondary | ICD-10-CM | POA: Insufficient documentation

## 2021-08-21 DIAGNOSIS — F10129 Alcohol abuse with intoxication, unspecified: Secondary | ICD-10-CM | POA: Diagnosis not present

## 2021-08-21 DIAGNOSIS — J9811 Atelectasis: Secondary | ICD-10-CM | POA: Diagnosis not present

## 2021-08-21 DIAGNOSIS — R079 Chest pain, unspecified: Secondary | ICD-10-CM | POA: Diagnosis not present

## 2021-08-21 LAB — TROPONIN I (HIGH SENSITIVITY)
Troponin I (High Sensitivity): 10 ng/L (ref <18)
Troponin I (High Sensitivity): 10 ng/L (ref <18)

## 2021-08-21 LAB — COMPREHENSIVE METABOLIC PANEL
ALT: 13 U/L (ref 0–44)
AST: 26 U/L (ref 15–41)
Albumin: 3.3 g/dL — ABNORMAL LOW (ref 3.5–5.0)
Alkaline Phosphatase: 109 U/L (ref 38–126)
Anion gap: 12 (ref 5–15)
BUN: 11 mg/dL (ref 8–23)
CO2: 29 mmol/L (ref 22–32)
Calcium: 8.4 mg/dL — ABNORMAL LOW (ref 8.9–10.3)
Chloride: 104 mmol/L (ref 98–111)
Creatinine, Ser: 0.71 mg/dL (ref 0.44–1.00)
GFR, Estimated: >60 mL/min (ref >60)
Glucose, Bld: 116 mg/dL — ABNORMAL HIGH (ref 70–99)
Potassium: 3.6 mmol/L (ref 3.5–5.1)
Sodium: 145 mmol/L (ref 135–145)
Total Bilirubin: 0.5 mg/dL (ref 0.3–1.2)
Total Protein: 6.3 g/dL — ABNORMAL LOW (ref 6.5–8.1)

## 2021-08-21 LAB — BRAIN NATRIURETIC PEPTIDE: B Natriuretic Peptide: 74 pg/mL (ref 0.0–100.0)

## 2021-08-21 MED ORDER — ALBUTEROL SULFATE HFA 108 (90 BASE) MCG/ACT IN AERS: 2.0000 | INHALATION_SPRAY | RESPIRATORY_TRACT | 2 refills | Status: DC | PRN

## 2021-08-21 MED ORDER — IBUPROFEN 800 MG PO TABS: 800.0000 mg | ORAL_TABLET | Freq: Three times a day (TID) | ORAL | 0 refills | Status: DC | PRN

## 2021-08-21 MED ORDER — AMOXICILLIN 500 MG PO CAPS: 500.0000 mg | ORAL_CAPSULE | Freq: Three times a day (TID) | ORAL | 0 refills | Status: DC

## 2021-08-21 MED ORDER — PREDNISONE 20 MG PO TABS: 40.0000 mg | ORAL_TABLET | Freq: Every day | ORAL | 0 refills | Status: DC

## 2021-08-21 MED ORDER — DOXYCYCLINE HYCLATE 100 MG PO CAPS: 100.0000 mg | ORAL_CAPSULE | Freq: Two times a day (BID) | ORAL | 0 refills | Status: DC

## 2021-08-21 MED ORDER — IOHEXOL 350 MG/ML SOLN
75.0000 mL | Freq: Once | INTRAVENOUS | Status: AC | PRN
  Administered 2021-08-21: 75 mL via INTRAVENOUS

## 2021-08-21 NOTE — ED Provider Notes (Signed)
Stacy Dalton Provider Note   CSN: [id number] Arrival date & time: 08/21/21  1713     History  Chief Complaint  Patient presents with   Otalgia    Stacy Dalton is a 71 y.o. female.  Patient complains of left earache.  Past medical history of hypertension and thyroid disease  The history is provided by the patient. No language interpreter was used.  Otalgia Location:  Left Behind ear:  No abnormality Quality:  Aching Severity:  Mild Onset quality:  Sudden Timing:  Constant Progression:  Worsening Chronicity:  New Context: not direct blow   Relieved by:  Nothing Worsened by:  Nothing Ineffective treatments:  None tried Associated symptoms: no abdominal pain, no congestion, no cough, no diarrhea, no ear discharge, no headaches and no rash       Home Medications Prior to Admission medications   Medication Sig Start Date End Date Taking? Authorizing Provider  amoxicillin (AMOXIL) 500 MG capsule Take 1 capsule (500 mg total) by mouth 3 (three) times daily. 08/21/21  Yes Milton Ferguson, MD  ibuprofen (ADVIL) 800 MG tablet Take 1 tablet (800 mg total) by mouth every 8 (eight) hours as needed. 08/21/21  Yes Milton Ferguson, MD  albuterol (VENTOLIN HFA) 108 (90 Base) MCG/ACT inhaler Inhale 2 puffs into the lungs every 4 (four) hours as needed for wheezing or shortness of breath. 08/21/21   Orpah Greek, MD  amLODipine (NORVASC) 5 MG tablet Take 1 tablet (5 mg total) by mouth daily. 05/22/21   Regalado, Belkys A, MD  doxycycline (VIBRAMYCIN) 100 MG capsule Take 1 capsule (100 mg total) by mouth 2 (two) times daily. 08/21/21   Orpah Greek, MD  DULoxetine (CYMBALTA) 60 MG capsule Take 1 capsule (60 mg total) by mouth daily. 08/08/21   Evelina Dun A, FNP  fluticasone-salmeterol (ADVAIR DISKUS) 250-50 MCG/ACT AEPB Inhale 1 puff into the lungs in the morning and at bedtime. Patient not taking: Reported on 05/25/2021 04/19/21 05/20/21  Deatra James, MD  folic acid (FOLVITE) 1 MG tablet Take 1 tablet (1 mg total) by mouth daily. 05/22/21   Regalado, Jerald Kief A, MD  levothyroxine (SYNTHROID) 50 MCG tablet Take 1 tablet (50 mcg total) by mouth daily before breakfast. 06/22/20   [name], FNP  Multiple Vitamin (MULTIVITAMIN WITH MINERALS) TABS tablet Take 1 tablet by mouth daily. 05/22/21   Regalado, Belkys A, MD  ondansetron (ZOFRAN) 4 MG tablet Take 1 tablet (4 mg total) by mouth every 8 (eight) hours as needed for nausea or vomiting. 08/08/21   Evelina Dun A, FNP  pantoprazole (PROTONIX) 40 MG tablet Take 1 tablet (40 mg total) by mouth daily. 06/13/21 06/13/22  Barton Dubois, MD  potassium chloride SA (KLOR-CON M) 20 MEQ tablet Take 1 tablet (20 mEq total) by mouth 2 (two) times daily. 51/88/41   Delora Fuel, MD  predniSONE (DELTASONE) 20 MG tablet Take 2 tablets (40 mg total) by mouth daily with breakfast. 08/21/21   Pollina, Gwenyth Allegra, MD  thiamine 100 MG tablet Take 1 tablet (100 mg total) by mouth daily. 05/22/21   Regalado, Belkys A, MD  vitamin B-12 (CYANOCOBALAMIN) 1000 MCG tablet Take 1 tablet (1,000 mcg total) by mouth daily. 05/21/21   Regalado, Belkys A, MD  Vitamin D, Ergocalciferol, (DRISDOL) 1.25 MG (50000 UNIT) CAPS capsule Take 1 capsule (50,000 Units total) by mouth every 7 (seven) days. 06/22/20   [name], FNP      Allergies  Librium [chlordiazepoxide], Toradol [ketorolac tromethamine], Butalbital-apap-caffeine, Demerol, Esgic [butalbital-apap-caffeine], Iodine, and Pyridium [phenazopyridine]    Review of Systems   Review of Systems  Constitutional:  Negative for appetite change and fatigue.  HENT:  Positive for ear pain. Negative for congestion, ear discharge and sinus pressure.   Eyes:  Negative for discharge.  Respiratory:  Negative for cough.   Cardiovascular:  Negative for chest pain.  Gastrointestinal:  Negative for abdominal pain and diarrhea.  Genitourinary:  Negative for  frequency and hematuria.  Musculoskeletal:  Negative for back pain.  Skin:  Negative for rash.  Neurological:  Negative for seizures and headaches.  Psychiatric/Behavioral:  Negative for hallucinations.    Physical Exam Updated Vital Signs BP 131/70    Pulse 95    Temp 98 F (36.7 C)    Resp 17    Ht 5\' 4"  (1.626 m)    Wt 51.4 kg    SpO2 93%    BMI 19.45 kg/m  Physical Exam Vitals and nursing note reviewed.  Constitutional:      Appearance: She is well-developed.  HENT:     Head: Normocephalic.     Ears:     Comments: Left TM looks ruptured    Nose: Nose normal.  Eyes:     General: No scleral icterus.    Conjunctiva/sclera: Conjunctivae normal.  Neck:     Thyroid: No thyromegaly.  Cardiovascular:     Rate and Rhythm: Normal rate and regular rhythm.     Heart sounds: No murmur heard.   No friction rub. No gallop.  Pulmonary:     Breath sounds: No stridor. No wheezing or rales.  Chest:     Chest wall: No tenderness.  Abdominal:     General: There is no distension.     Tenderness: There is no abdominal tenderness. There is no rebound.  Musculoskeletal:        General: Normal range of motion.     Cervical back: Neck supple.  Lymphadenopathy:     Cervical: No cervical adenopathy.  Skin:    Findings: No erythema or rash.  Neurological:     Mental Status: She is alert and oriented to person, place, and time.     Motor: No abnormal muscle tone.     Coordination: Coordination normal.  Psychiatric:        Behavior: Behavior normal.    ED Results / Procedures / Treatments   Labs (all labs ordered are listed, but only abnormal results are displayed) Labs Reviewed - No data to display  EKG None  Radiology CT Angio Chest Pulmonary Embolism (PE) W or WO Contrast  Result Date: 08/21/2021 CLINICAL DATA:  71 year old female with chest pain. EXAM: CT ANGIOGRAPHY CHEST WITH CONTRAST TECHNIQUE: Multidetector CT imaging of the chest was performed using the standard protocol  during bolus administration of intravenous contrast. Multiplanar CT image reconstructions and MIPs were obtained to evaluate the vascular anatomy. RADIATION DOSE REDUCTION: This exam was performed according to the departmental dose-optimization program which includes automated exposure control, adjustment of the mA and/or kV according to patient size and/or use of iterative reconstruction technique. CONTRAST:  23mL OMNIPAQUE IOHEXOL 350 MG/ML SOLN COMPARISON:  Portable chest 08/20/2021. Chest CT 02/28/2021. FINDINGS: Cardiovascular: Good contrast bolus timing in the pulmonary arterial tree. Mild respiratory motion at the lung bases. No focal filling defect identified in the pulmonary arteries to suggest acute pulmonary embolism. Extensive Calcified aortic atherosclerosis. 4 vessel arch configuration (normal variant). Calcified coronary artery atherosclerosis. Stable heart  size, no significant cardiomegaly. No pericardial effusion. Mediastinum/Nodes: No mediastinal mass or lymphadenopathy. But there is abnormal circumferential wall thickening of the thoracic esophagus which appears indistinct (series 5, image 288). Lungs/Pleura: Lower lung volumes and atelectatic changes to the major airways which remain patent. Linear chronic scarring in the anteromedial right upper lobe. Minor atelectasis. No pleural effusion or other abnormal pulmonary opacity. Upper Abdomen: Grossly negative visible liver, spleen, pancreas, adrenal glands, left kidney and bowel in the upper abdomen. Musculoskeletal: No acute osseous abnormality identified. Review of the MIP images confirms the above findings. IMPRESSION: 1. Negative for acute pulmonary embolus. 2. Evidence of Acute Esophagitis, with abnormal circumferential mural thickening most pronounced in the distal esophagus. 3. No other acute finding in the chest. 4. Calcified coronary artery and Aortic Atherosclerosis (ICD10-I70.0). Electronically Signed   By: Genevie Ann M.D.   On: 08/21/2021  05:15   DG Chest Portable 1 View  Result Date: 08/21/2021 CLINICAL DATA:  Shortness of breath. EXAM: PORTABLE CHEST 1 VIEW COMPARISON:  Radiograph yesterday, chest CTA earlier today. FINDINGS: Stable heart size and mediastinal contours. Aortic atherosclerosis. Mild chronic interstitial coarsening without acute airspace disease. No pleural effusion, pneumothorax, or pulmonary edema. Stable osseous structures. IMPRESSION: No acute radiographic findings. Electronically Signed   By: Keith Rake M.D.   On: 08/21/2021 20:28   DG Chest Port 1 View  Result Date: 08/20/2021 CLINICAL DATA:  Chest pain and shortness of breath. EXAM: PORTABLE CHEST 1 VIEW COMPARISON:  Radiograph 06/28/2021, chest CT 08/19/2020 FINDINGS: Stable heart size and mediastinal contours. Aortic atherosclerosis. Chronic interstitial coarsening without confluent consolidation no pulmonary edema, pleural effusion, or pneumothorax. No acute osseous abnormalities are seen. IMPRESSION: No acute chest findings. Electronically Signed   By: Keith Rake M.D.   On: 08/20/2021 23:28    Procedures Procedures    Medications Ordered in ED Medications - No data to display  ED Course/ Medical Decision Making/ A&P                           Medical Decision Making Amount and/or Complexity of Data Reviewed Radiology: ordered.  Risk Prescription drug management.   Patient with a ruptured left eardrum.  She is given amoxicillin for possible infection and Motrin and referred to ENT    This patient presents to the ED for concern of left earache, this involves an extensive number of treatment options, and is a complaint that carries with it a high risk of complications and morbidity.  The differential diagnosis includes left otitis media or otitis externa   Co morbidities that complicate the patient evaluation  COPD   Additional history obtained:  Additional history obtained from patient External records from outside source  obtained and reviewed including hospital record   Lab Tests:  I Ordered, and personally interpreted labs.  The pertinent results include: No labs   Imaging Studies ordered:  I ordered imaging studies including chest x-ray I independently visualized and interpreted imaging which showed unremarkable I agree with the radiologist interpretation   Cardiac Monitoring:  The patient was maintained on a cardiac monitor.  I personally viewed and interpreted the cardiac monitored which showed an underlying rhythm of: Normal sinus rhythm   Medicines ordered and prescription drug management: No medicines ordered Reevaluation of the patient after these medicines showed that the patient stayed the same I have reviewed the patients home medicines and have made adjustments as needed   Test Considered:  CT of head  Critical Interventions:      Consultations Obtained:  No consult  Problem List / ED Course:  COPD and ruptured left eardrum   Reevaluation:  After the interventions noted above, I reevaluated the patient and found that they have :stayed the same   Social Determinants of Health:  None   Dispostion:  After consideration of the diagnostic results and the patients response to treatment, I feel that the patent would benefit from DC home with follow-up with ENT.  Patient placed on amoxicillin for possible infection.         Final Clinical Impression(s) / ED Diagnoses Final diagnoses:  Otalgia of left ear    Rx / DC Orders ED Discharge Orders          Ordered    ibuprofen (ADVIL) 800 MG tablet  Every 8 hours PRN        08/21/21 2052    amoxicillin (AMOXIL) 500 MG capsule  3 times daily        08/21/21 2052              Milton Ferguson, MD 08/23/21 [phone]

## 2021-08-21 NOTE — ED Notes (Signed)
[facility] called to set up transportation at this time.

## 2021-08-21 NOTE — ED Triage Notes (Signed)
Patient arrives via EMS c/o otalgia in left ear. She admits to drinking a moderate amount of Vodka . C/o sharp pain in mid-chest and nausea. Denies any shortness of breath or vomiting. Denies any trauma to left ear

## 2021-08-21 NOTE — ED Notes (Signed)
Attempts to call family for ride. No one wanted to pick her up. Advised family she will be waiting in the lobby.

## 2021-08-21 NOTE — ED Notes (Signed)
Pt reports left ear is bleeding. Assessed ear and it appear there is a small scratch just at the opening of the ear canal. Site cleaned. Pt likely scratched with fingernail as a small amount of dried blood noted under nail.

## 2021-08-21 NOTE — Discharge Instructions (Signed)
Follow-up with Dr. Constance Holster for your ruptured eardrum

## 2021-08-21 NOTE — ED Notes (Signed)
Pt sats remain 97% on room air.

## 2021-08-21 NOTE — ED Notes (Signed)
Pt frequently using call bell requesting food. Educated that we cannot feed her until xray is done and and cleared by edp

## 2021-08-21 NOTE — ED Notes (Signed)
Attempted to assist pt in calling ex-husband for transport home, but she states she already talked to him. Assisted pt again to dial number but when calling it states number not in service. She does not want any of her other contacts called. [facility] set up for pt to discharge home.

## 2021-08-22 NOTE — Chronic Care Management (AMB) (Unsigned)
°  Care Management   Note  08/22/2021 Name: Stacy Dalton MRN: [id number] DOB: 1951-04-12  Stacy Dalton is a 71 y.o. year old female who is a primary care patient of [name], FNP and is actively engaged with the care management team. I reached out to [name] by phone today to assist with re-scheduling a follow up visit with the Licensed Clinical Social Worker  Follow up plan: The care management team will reach out to the patient again over the next 7 days.  If patient returns call to provider office, please advise to call [facility] [address], [place], [place], [place] [address] Direct Dial: [phone] [email or web address]

## 2021-08-26 ENCOUNTER — Inpatient Hospital Stay (HOSPITAL_COMMUNITY)
Admission: EM | Admit: 2021-08-26 | Discharge: 2021-08-29 | DRG: 392 | Disposition: A | Discharge status: Home / Self Care | Payer: Medicare Other | Attending: Internal Medicine | Admitting: Internal Medicine

## 2021-08-26 ENCOUNTER — Other Ambulatory Visit: Payer: Self-pay

## 2021-08-26 DIAGNOSIS — K2920 Alcoholic gastritis without bleeding: Secondary | ICD-10-CM | POA: Diagnosis not present

## 2021-08-26 DIAGNOSIS — Z823 Family history of stroke: Secondary | ICD-10-CM

## 2021-08-26 DIAGNOSIS — I160 Hypertensive urgency: Secondary | ICD-10-CM

## 2021-08-26 DIAGNOSIS — F10939 Alcohol use, unspecified with withdrawal, unspecified: Principal | ICD-10-CM | POA: Diagnosis present

## 2021-08-26 DIAGNOSIS — Z800 Family history of malignant neoplasm of digestive organs: Secondary | ICD-10-CM

## 2021-08-26 DIAGNOSIS — J449 Chronic obstructive pulmonary disease, unspecified: Secondary | ICD-10-CM | POA: Diagnosis present

## 2021-08-26 DIAGNOSIS — F1120 Opioid dependence, uncomplicated: Secondary | ICD-10-CM | POA: Diagnosis present

## 2021-08-26 DIAGNOSIS — E860 Dehydration: Secondary | ICD-10-CM | POA: Diagnosis not present

## 2021-08-26 DIAGNOSIS — D6489 Other specified anemias: Secondary | ICD-10-CM | POA: Diagnosis present

## 2021-08-26 DIAGNOSIS — E785 Hyperlipidemia, unspecified: Secondary | ICD-10-CM | POA: Diagnosis present

## 2021-08-26 DIAGNOSIS — R112 Nausea with vomiting, unspecified: Secondary | ICD-10-CM | POA: Diagnosis not present

## 2021-08-26 DIAGNOSIS — I491 Atrial premature depolarization: Secondary | ICD-10-CM | POA: Diagnosis not present

## 2021-08-26 DIAGNOSIS — Z87891 Personal history of nicotine dependence: Secondary | ICD-10-CM

## 2021-08-26 DIAGNOSIS — K449 Diaphragmatic hernia without obstruction or gangrene: Secondary | ICD-10-CM | POA: Diagnosis present

## 2021-08-26 DIAGNOSIS — Z833 Family history of diabetes mellitus: Secondary | ICD-10-CM

## 2021-08-26 DIAGNOSIS — K222 Esophageal obstruction: Secondary | ICD-10-CM | POA: Diagnosis present

## 2021-08-26 DIAGNOSIS — D72829 Elevated white blood cell count, unspecified: Secondary | ICD-10-CM | POA: Diagnosis present

## 2021-08-26 DIAGNOSIS — R0789 Other chest pain: Secondary | ICD-10-CM

## 2021-08-26 DIAGNOSIS — K2100 Gastro-esophageal reflux disease with esophagitis, without bleeding: Secondary | ICD-10-CM | POA: Diagnosis present

## 2021-08-26 DIAGNOSIS — Z20822 Contact with and (suspected) exposure to covid-19: Secondary | ICD-10-CM | POA: Diagnosis present

## 2021-08-26 DIAGNOSIS — I10 Essential (primary) hypertension: Secondary | ICD-10-CM | POA: Diagnosis not present

## 2021-08-26 DIAGNOSIS — F32A Depression, unspecified: Secondary | ICD-10-CM | POA: Diagnosis present

## 2021-08-26 DIAGNOSIS — G894 Chronic pain syndrome: Secondary | ICD-10-CM | POA: Diagnosis present

## 2021-08-26 DIAGNOSIS — E039 Hypothyroidism, unspecified: Secondary | ICD-10-CM | POA: Diagnosis present

## 2021-08-26 DIAGNOSIS — Z90710 Acquired absence of both cervix and uterus: Secondary | ICD-10-CM

## 2021-08-26 DIAGNOSIS — R0689 Other abnormalities of breathing: Secondary | ICD-10-CM | POA: Diagnosis not present

## 2021-08-26 DIAGNOSIS — F1020 Alcohol dependence, uncomplicated: Secondary | ICD-10-CM | POA: Diagnosis present

## 2021-08-26 DIAGNOSIS — Y900 Blood alcohol level of less than 20 mg/100 ml: Secondary | ICD-10-CM | POA: Diagnosis present

## 2021-08-26 DIAGNOSIS — D72828 Other elevated white blood cell count: Secondary | ICD-10-CM | POA: Diagnosis present

## 2021-08-26 DIAGNOSIS — F410 Panic disorder [episodic paroxysmal anxiety] without agoraphobia: Secondary | ICD-10-CM | POA: Diagnosis present

## 2021-08-26 DIAGNOSIS — R739 Hyperglycemia, unspecified: Secondary | ICD-10-CM | POA: Diagnosis present

## 2021-08-26 DIAGNOSIS — Z79899 Other long term (current) drug therapy: Secondary | ICD-10-CM

## 2021-08-26 DIAGNOSIS — R000 Tachycardia, unspecified: Secondary | ICD-10-CM | POA: Diagnosis not present

## 2021-08-26 DIAGNOSIS — Z825 Family history of asthma and other chronic lower respiratory diseases: Secondary | ICD-10-CM

## 2021-08-26 DIAGNOSIS — Z8249 Family history of ischemic heart disease and other diseases of the circulatory system: Secondary | ICD-10-CM

## 2021-08-26 DIAGNOSIS — K209 Esophagitis, unspecified without bleeding: Secondary | ICD-10-CM

## 2021-08-26 DIAGNOSIS — Z79890 Hormone replacement therapy (postmenopausal): Secondary | ICD-10-CM

## 2021-08-26 LAB — CBG MONITORING, ED: Glucose-Capillary: 194 mg/dL — ABNORMAL HIGH (ref 70–99)

## 2021-08-26 NOTE — ED Triage Notes (Signed)
Patient BIB EMS for evaluation of ETOH withdrawal.  Patient  report last drink was "a little this morning."  C/o nausea, vomiting, and diarrhea.  Tremors noted on arrival.  Given 300 mL NS and Zofran 4 mg IV by EMS

## 2021-08-27 ENCOUNTER — Other Ambulatory Visit: Payer: Self-pay

## 2021-08-27 ENCOUNTER — Encounter (HOSPITAL_COMMUNITY): Payer: Self-pay | Admitting: Internal Medicine

## 2021-08-27 ENCOUNTER — Inpatient Hospital Stay (HOSPITAL_COMMUNITY): Payer: Medicare Other

## 2021-08-27 DIAGNOSIS — K449 Diaphragmatic hernia without obstruction or gangrene: Secondary | ICD-10-CM | POA: Diagnosis not present

## 2021-08-27 DIAGNOSIS — E785 Hyperlipidemia, unspecified: Secondary | ICD-10-CM | POA: Diagnosis present

## 2021-08-27 DIAGNOSIS — F32A Depression, unspecified: Secondary | ICD-10-CM | POA: Diagnosis present

## 2021-08-27 DIAGNOSIS — J449 Chronic obstructive pulmonary disease, unspecified: Secondary | ICD-10-CM | POA: Diagnosis not present

## 2021-08-27 DIAGNOSIS — K2970 Gastritis, unspecified, without bleeding: Secondary | ICD-10-CM | POA: Diagnosis not present

## 2021-08-27 DIAGNOSIS — D72829 Elevated white blood cell count, unspecified: Secondary | ICD-10-CM

## 2021-08-27 DIAGNOSIS — D6489 Other specified anemias: Secondary | ICD-10-CM | POA: Diagnosis present

## 2021-08-27 DIAGNOSIS — R1110 Vomiting, unspecified: Secondary | ICD-10-CM | POA: Diagnosis not present

## 2021-08-27 DIAGNOSIS — E039 Hypothyroidism, unspecified: Secondary | ICD-10-CM | POA: Diagnosis not present

## 2021-08-27 DIAGNOSIS — F10930 Alcohol use, unspecified with withdrawal, uncomplicated: Secondary | ICD-10-CM | POA: Diagnosis not present

## 2021-08-27 DIAGNOSIS — Z20822 Contact with and (suspected) exposure to covid-19: Secondary | ICD-10-CM | POA: Diagnosis present

## 2021-08-27 DIAGNOSIS — F10939 Alcohol use, unspecified with withdrawal, unspecified: Secondary | ICD-10-CM

## 2021-08-27 DIAGNOSIS — I160 Hypertensive urgency: Secondary | ICD-10-CM

## 2021-08-27 DIAGNOSIS — Z833 Family history of diabetes mellitus: Secondary | ICD-10-CM | POA: Diagnosis not present

## 2021-08-27 DIAGNOSIS — R739 Hyperglycemia, unspecified: Secondary | ICD-10-CM | POA: Diagnosis present

## 2021-08-27 DIAGNOSIS — Z825 Family history of asthma and other chronic lower respiratory diseases: Secondary | ICD-10-CM | POA: Diagnosis not present

## 2021-08-27 DIAGNOSIS — F1120 Opioid dependence, uncomplicated: Secondary | ICD-10-CM | POA: Diagnosis present

## 2021-08-27 DIAGNOSIS — R079 Chest pain, unspecified: Secondary | ICD-10-CM | POA: Diagnosis not present

## 2021-08-27 DIAGNOSIS — I10 Essential (primary) hypertension: Secondary | ICD-10-CM | POA: Diagnosis not present

## 2021-08-27 DIAGNOSIS — R0789 Other chest pain: Secondary | ICD-10-CM

## 2021-08-27 DIAGNOSIS — R933 Abnormal findings on diagnostic imaging of other parts of digestive tract: Secondary | ICD-10-CM | POA: Diagnosis not present

## 2021-08-27 DIAGNOSIS — K2289 Other specified disease of esophagus: Secondary | ICD-10-CM | POA: Diagnosis not present

## 2021-08-27 DIAGNOSIS — Z8249 Family history of ischemic heart disease and other diseases of the circulatory system: Secondary | ICD-10-CM | POA: Diagnosis not present

## 2021-08-27 DIAGNOSIS — F410 Panic disorder [episodic paroxysmal anxiety] without agoraphobia: Secondary | ICD-10-CM | POA: Diagnosis present

## 2021-08-27 DIAGNOSIS — G894 Chronic pain syndrome: Secondary | ICD-10-CM

## 2021-08-27 DIAGNOSIS — K209 Esophagitis, unspecified without bleeding: Secondary | ICD-10-CM | POA: Diagnosis not present

## 2021-08-27 DIAGNOSIS — R112 Nausea with vomiting, unspecified: Secondary | ICD-10-CM | POA: Diagnosis not present

## 2021-08-27 DIAGNOSIS — K219 Gastro-esophageal reflux disease without esophagitis: Secondary | ICD-10-CM | POA: Diagnosis not present

## 2021-08-27 DIAGNOSIS — Z79899 Other long term (current) drug therapy: Secondary | ICD-10-CM | POA: Diagnosis not present

## 2021-08-27 DIAGNOSIS — Z87891 Personal history of nicotine dependence: Secondary | ICD-10-CM | POA: Diagnosis not present

## 2021-08-27 DIAGNOSIS — R1310 Dysphagia, unspecified: Secondary | ICD-10-CM | POA: Diagnosis not present

## 2021-08-27 DIAGNOSIS — K2920 Alcoholic gastritis without bleeding: Secondary | ICD-10-CM | POA: Diagnosis present

## 2021-08-27 DIAGNOSIS — Z79890 Hormone replacement therapy (postmenopausal): Secondary | ICD-10-CM | POA: Diagnosis not present

## 2021-08-27 DIAGNOSIS — Y900 Blood alcohol level of less than 20 mg/100 ml: Secondary | ICD-10-CM | POA: Diagnosis present

## 2021-08-27 DIAGNOSIS — F1020 Alcohol dependence, uncomplicated: Secondary | ICD-10-CM | POA: Diagnosis present

## 2021-08-27 DIAGNOSIS — K222 Esophageal obstruction: Secondary | ICD-10-CM | POA: Diagnosis not present

## 2021-08-27 DIAGNOSIS — Z90710 Acquired absence of both cervix and uterus: Secondary | ICD-10-CM | POA: Diagnosis not present

## 2021-08-27 DIAGNOSIS — K2100 Gastro-esophageal reflux disease with esophagitis, without bleeding: Secondary | ICD-10-CM | POA: Diagnosis not present

## 2021-08-27 LAB — RAPID URINE DRUG SCREEN, HOSP PERFORMED
Amphetamines: NOT DETECTED
Barbiturates: NOT DETECTED
Benzodiazepines: NOT DETECTED
Cocaine: NOT DETECTED
Opiates: NOT DETECTED
Tetrahydrocannabinol: NOT DETECTED

## 2021-08-27 LAB — CBC WITH DIFFERENTIAL/PLATELET
Abs Immature Granulocytes: 0.31 10*3/uL — ABNORMAL HIGH (ref 0.00–0.07)
Basophils Absolute: 0.1 10*3/uL (ref 0.0–0.1)
Basophils Relative: 1 %
Eosinophils Absolute: 0 10*3/uL (ref 0.0–0.5)
Eosinophils Relative: 0 %
HCT: 34.6 % — ABNORMAL LOW (ref 36.0–46.0)
Hemoglobin: 11.2 g/dL — ABNORMAL LOW (ref 12.0–15.0)
Immature Granulocytes: 2 %
Lymphocytes Relative: 7 %
Lymphs Abs: 1.1 10*3/uL (ref 0.7–4.0)
MCH: 26.6 pg (ref 26.0–34.0)
MCHC: 32.4 g/dL (ref 30.0–36.0)
MCV: 82.2 fL (ref 80.0–100.0)
Monocytes Absolute: 1.2 10*3/uL — ABNORMAL HIGH (ref 0.1–1.0)
Monocytes Relative: 9 %
Neutro Abs: 11.8 10*3/uL — ABNORMAL HIGH (ref 1.7–7.7)
Neutrophils Relative %: 81 %
Platelets: 194 10*3/uL (ref 150–400)
RBC: 4.21 MIL/uL (ref 3.87–5.11)
RDW: 18.6 % — ABNORMAL HIGH (ref 11.5–15.5)
WBC: 14.5 10*3/uL — ABNORMAL HIGH (ref 4.0–10.5)
nRBC: 0 % (ref 0.0–0.2)

## 2021-08-27 LAB — COMPREHENSIVE METABOLIC PANEL
ALT: 18 U/L (ref 0–44)
AST: 36 U/L (ref 15–41)
Albumin: 3.5 g/dL (ref 3.5–5.0)
Alkaline Phosphatase: 100 U/L (ref 38–126)
Anion gap: 13 (ref 5–15)
BUN: 21 mg/dL (ref 8–23)
CO2: 29 mmol/L (ref 22–32)
Calcium: 8 mg/dL — ABNORMAL LOW (ref 8.9–10.3)
Chloride: 98 mmol/L (ref 98–111)
Creatinine, Ser: 0.79 mg/dL (ref 0.44–1.00)
GFR, Estimated: >60 mL/min (ref >60)
Glucose, Bld: 151 mg/dL — ABNORMAL HIGH (ref 70–99)
Potassium: 3.5 mmol/L (ref 3.5–5.1)
Sodium: 140 mmol/L (ref 135–145)
Total Bilirubin: 0.5 mg/dL (ref 0.3–1.2)
Total Protein: 7.1 g/dL (ref 6.5–8.1)

## 2021-08-27 LAB — URINALYSIS, ROUTINE W REFLEX MICROSCOPIC
Bacteria, UA: NONE SEEN
Bilirubin Urine: NEGATIVE
Glucose, UA: 50 mg/dL — AB
Hgb urine dipstick: NEGATIVE
Ketones, ur: NEGATIVE mg/dL
Leukocytes,Ua: NEGATIVE
Nitrite: NEGATIVE
Protein, ur: 30 mg/dL — AB
Specific Gravity, Urine: 1.012 (ref 1.005–1.030)
pH: 7 (ref 5.0–8.0)

## 2021-08-27 LAB — RESP PANEL BY RT-PCR (FLU A&B, COVID) ARPGX2
Influenza A by PCR: NEGATIVE
Influenza B by PCR: NEGATIVE
SARS Coronavirus 2 by RT PCR: NEGATIVE

## 2021-08-27 LAB — LIPASE, BLOOD: Lipase: 37 U/L (ref 11–51)

## 2021-08-27 LAB — TROPONIN I (HIGH SENSITIVITY)
Troponin I (High Sensitivity): 17 ng/L (ref <18)
Troponin I (High Sensitivity): 22 ng/L — ABNORMAL HIGH (ref <18)

## 2021-08-27 LAB — PHOSPHORUS: Phosphorus: 4.3 mg/dL (ref 2.5–4.6)

## 2021-08-27 LAB — MAGNESIUM: Magnesium: 1 mg/dL — ABNORMAL LOW (ref 1.7–2.4)

## 2021-08-27 LAB — MRSA NEXT GEN BY PCR, NASAL: MRSA by PCR Next Gen: NOT DETECTED

## 2021-08-27 LAB — ETHANOL: Alcohol, Ethyl (B): <10 mg/dL (ref <10)

## 2021-08-27 MED ORDER — CHLORHEXIDINE GLUCONATE CLOTH 2 % EX PADS
6.0000 | MEDICATED_PAD | Freq: Every day | CUTANEOUS | Status: DC
  Administered 2021-08-27 – 2021-08-28 (×3): 6 via TOPICAL

## 2021-08-27 MED ORDER — LORAZEPAM 1 MG PO TABS
1.0000 mg | ORAL_TABLET | ORAL | Status: DC | PRN
  Administered 2021-08-28: 2 mg via ORAL
  Administered 2021-08-28 – 2021-08-29 (×3): 1 mg via ORAL
  Filled 2021-08-27 (×3): qty 1
  Filled 2021-08-27: qty 2

## 2021-08-27 MED ORDER — CLONIDINE HCL 0.1 MG PO TABS
0.1000 mg | ORAL_TABLET | Freq: Once | ORAL | Status: AC
  Administered 2021-08-27: 0.1 mg via ORAL
  Filled 2021-08-27: qty 1

## 2021-08-27 MED ORDER — AMLODIPINE BESYLATE 5 MG PO TABS: 5.0000 mg | ORAL_TABLET | Freq: Every day | ORAL | Status: DC

## 2021-08-27 MED ORDER — ACETAMINOPHEN 500 MG PO TABS
1000.0000 mg | ORAL_TABLET | Freq: Four times a day (QID) | ORAL | Status: DC | PRN
  Administered 2021-08-27: 1000 mg via ORAL
  Filled 2021-08-27: qty 2

## 2021-08-27 MED ORDER — ONDANSETRON HCL 4 MG/2ML IJ SOLN
4.0000 mg | Freq: Once | INTRAMUSCULAR | Status: AC
  Administered 2021-08-27: 4 mg via INTRAVENOUS
  Filled 2021-08-27: qty 2

## 2021-08-27 MED ORDER — DULOXETINE HCL 60 MG PO CPEP
60.0000 mg | ORAL_CAPSULE | Freq: Every day | ORAL | Status: DC
  Administered 2021-08-28: 60 mg via ORAL
  Filled 2021-08-27: qty 1

## 2021-08-27 MED ORDER — NICARDIPINE HCL IN NACL 20-0.86 MG/200ML-% IV SOLN
3.0000 mg/h | INTRAVENOUS | Status: DC
  Administered 2021-08-27: 5 mg/h via INTRAVENOUS
  Filled 2021-08-27: qty 200

## 2021-08-27 MED ORDER — PANTOPRAZOLE SODIUM 40 MG IV SOLR
40.0000 mg | Freq: Two times a day (BID) | INTRAVENOUS | Status: DC
  Administered 2021-08-27 – 2021-08-28 (×4): 40 mg via INTRAVENOUS
  Filled 2021-08-27 (×4): qty 10

## 2021-08-27 MED ORDER — LORAZEPAM 2 MG/ML IJ SOLN
2.0000 mg | Freq: Once | INTRAMUSCULAR | Status: AC
  Administered 2021-08-27: 2 mg via INTRAVENOUS
  Filled 2021-08-27: qty 1

## 2021-08-27 MED ORDER — IOHEXOL 300 MG/ML  SOLN
100.0000 mL | Freq: Once | INTRAMUSCULAR | Status: AC | PRN
  Administered 2021-08-27: 100 mL via INTRAVENOUS

## 2021-08-27 MED ORDER — ADULT MULTIVITAMIN W/MINERALS CH
1.0000 | ORAL_TABLET | Freq: Every day | ORAL | Status: DC
  Administered 2021-08-27 – 2021-08-28 (×2): 1 via ORAL
  Filled 2021-08-27 (×2): qty 1

## 2021-08-27 MED ORDER — ONDANSETRON HCL 4 MG/2ML IJ SOLN
4.0000 mg | Freq: Four times a day (QID) | INTRAMUSCULAR | Status: DC | PRN
Start: 2021-08-27 — End: 2021-08-29
  Administered 2021-08-27: 4 mg via INTRAVENOUS
  Filled 2021-08-27: qty 2

## 2021-08-27 MED ORDER — PANTOPRAZOLE SODIUM 40 MG PO TBEC: 40.0000 mg | DELAYED_RELEASE_TABLET | Freq: Every day | ORAL | Status: DC

## 2021-08-27 MED ORDER — LORAZEPAM 2 MG/ML IJ SOLN
1.0000 mg | INTRAMUSCULAR | Status: DC | PRN
  Administered 2021-08-27: 4 mg via INTRAVENOUS
  Administered 2021-08-27 (×2): 3 mg via INTRAVENOUS
  Administered 2021-08-27 (×2): 4 mg via INTRAVENOUS
  Administered 2021-08-28: 2 mg via INTRAVENOUS
  Administered 2021-08-29: 1 mg via INTRAVENOUS
  Filled 2021-08-27 (×4): qty 2
  Filled 2021-08-27 (×2): qty 1
  Filled 2021-08-27: qty 2

## 2021-08-27 MED ORDER — POTASSIUM CHLORIDE IN NACL 20-0.9 MEQ/L-% IV SOLN: INTRAVENOUS | Status: AC

## 2021-08-27 MED ORDER — IOHEXOL 9 MG/ML PO SOLN
500.0000 mL | ORAL | Status: AC
  Administered 2021-08-27 (×2): 500 mL via ORAL

## 2021-08-27 MED ORDER — SODIUM CHLORIDE 0.9 % IV SOLN: INTRAVENOUS | Status: DC

## 2021-08-27 MED ORDER — LEVOTHYROXINE SODIUM 50 MCG PO TABS
50.0000 ug | ORAL_TABLET | Freq: Every day | ORAL | Status: DC
  Administered 2021-08-27 – 2021-08-29 (×3): 50 ug via ORAL
  Filled 2021-08-27 (×3): qty 2

## 2021-08-27 MED ORDER — IOHEXOL 9 MG/ML PO SOLN
ORAL | Status: AC
  Administered 2021-08-27: 500 mL
  Filled 2021-08-27: qty 1000

## 2021-08-27 MED ORDER — THIAMINE HCL 100 MG PO TABS
100.0000 mg | ORAL_TABLET | Freq: Every day | ORAL | Status: DC
  Administered 2021-08-27 – 2021-08-28 (×2): 100 mg via ORAL
  Filled 2021-08-27 (×2): qty 1

## 2021-08-27 MED ORDER — THIAMINE HCL 100 MG/ML IJ SOLN
100.0000 mg | Freq: Every day | INTRAMUSCULAR | Status: DC
  Filled 2021-08-27: qty 2

## 2021-08-27 MED ORDER — FOLIC ACID 1 MG PO TABS
1.0000 mg | ORAL_TABLET | Freq: Every day | ORAL | Status: DC
  Administered 2021-08-27 – 2021-08-28 (×2): 1 mg via ORAL
  Filled 2021-08-27 (×2): qty 1

## 2021-08-27 MED ORDER — ENOXAPARIN SODIUM 40 MG/0.4ML IJ SOSY
40.0000 mg | PREFILLED_SYRINGE | INTRAMUSCULAR | Status: DC
  Administered 2021-08-27 – 2021-08-28 (×2): 40 mg via SUBCUTANEOUS
  Filled 2021-08-27 (×3): qty 0.4

## 2021-08-27 MED ORDER — SODIUM CHLORIDE 0.9 % IV BOLUS
1000.0000 mL | Freq: Once | INTRAVENOUS | Status: AC
  Administered 2021-08-27: 1000 mL via INTRAVENOUS

## 2021-08-27 MED ORDER — DOCUSATE SODIUM 50 MG/5ML PO LIQD
100.0000 mg | Freq: Every day | ORAL | Status: DC | PRN
  Administered 2021-08-27: 100 mg via ORAL
  Filled 2021-08-27 (×2): qty 10

## 2021-08-27 MED ORDER — LORAZEPAM 2 MG/ML IJ SOLN
1.0000 mg | Freq: Once | INTRAMUSCULAR | Status: AC
  Administered 2021-08-27: 1 mg via INTRAVENOUS
  Filled 2021-08-27: qty 1

## 2021-08-27 MED ORDER — MAGNESIUM SULFATE 2 GM/50ML IV SOLN
2.0000 g | Freq: Once | INTRAVENOUS | Status: AC
  Administered 2021-08-27: 2 g via INTRAVENOUS
  Filled 2021-08-27: qty 50

## 2021-08-27 MED ORDER — AMLODIPINE BESYLATE 5 MG PO TABS
5.0000 mg | ORAL_TABLET | Freq: Every day | ORAL | Status: DC
Start: 2021-08-27 — End: 2021-08-29
  Administered 2021-08-27 – 2021-08-28 (×2): 5 mg via ORAL
  Filled 2021-08-27 (×2): qty 1

## 2021-08-27 NOTE — Assessment & Plan Note (Addendum)
Continue amlodipine per home regimen Increase amlodipine to 10 mg daily

## 2021-08-27 NOTE — Hospital Course (Addendum)
71 year old female with a history of alcohol abuse/dependence, DTs, COPD, hypertension, hypothyroidism, chronic pain, depression, anxiety presenting with 1 to 2-day history of nausea, vomiting, and abdominal pain.  In addition, the patient was complaining of some alcohol withdrawal symptoms including jitteriness and shaking.  Notably, the patient continues to drink about 1/5 of vodka every 2 days.  Her last drink was on the morning of admission.  The patient was recently mated to the hospital from 06/06/2021 to 06/13/2021 when she had delirium tremens and required a Precedex drip.  She was also treated for UTI and AKI during that hospitalization.  Unfortunately, she continues to drink alcohol.  She is complaining of intermittent chest pain and shortness of breath.  She is not really able to elaborate how long she has been having only stating that it has been quite a long time.  She denies any coughing or hemoptysis.  She quit smoking in 2000 after 40-pack-year history.  She denies any illicit drugs.  She states that she has chronic daily headaches and back pain for which she takes tramadol at home.  She denies any dysuria, hematuria, hematochezia, melena, hematemesis. In the ED, the patient was afebrile hemodynamically stable with oxygen saturation 92-95% on room air.  WBC 14.5, hemoglobin 11.2, platelets 194,000.  Sodium 140, potassium 3.5, serum creatinine 0.79.  Troponin 17>> 20.  Echo was reassuring with EF 60-65%, no WMA.  GI was consulted for her esophagitis and EGD was performed which confirmed grade C reflux esophagitis.  She was started on sulcrafate in addition to bid pantoprazole.  Post-EGD she tolerated her diet without difficulty and was d/ced with GI follow up on 09/20/21.

## 2021-08-27 NOTE — Assessment & Plan Note (Addendum)
Alcohol level was < 10 Continue CIWA protocol, thiamine, folic acid and multivitamin Continue fall precaution and neuro checks Patient counseled on alcohol withdrawal cessation No signs of withdrawal during the hospitalization--pt requesting ativan for baseline anxiety

## 2021-08-27 NOTE — Assessment & Plan Note (Signed)
Continue Synthroid °

## 2021-08-27 NOTE — Assessment & Plan Note (Signed)
Continue Cymbalta.

## 2021-08-27 NOTE — Assessment & Plan Note (Signed)
As above in chronic pain syndrome

## 2021-08-27 NOTE — Progress Notes (Addendum)
PROGRESS NOTE  Stacy Dalton XBL:[id number] DOB: Apr 27, 1951 DOA: 08/26/2021 PCP: [name], FNP  Brief History:  71 year old female with a history of alcohol abuse/dependence, DTs, COPD, hypertension, hypothyroidism, chronic pain, depression, anxiety presenting with 1 to 2-day history of nausea, vomiting, and abdominal pain.  In addition, the patient was complaining of some alcohol withdrawal symptoms including jitteriness and shaking.  Notably, the patient continues to drink about 1/5 of vodka every 2 days.  Her last drink was on the morning of admission.  The patient was recently mated to the hospital from 06/06/2021 to 06/13/2021 when she had delirium tremens and required a Precedex drip.  She was also treated for UTI and AKI during that hospitalization.  Unfortunately, she continues to drink alcohol.  She is complaining of intermittent chest pain and shortness of breath.  She is not really able to elaborate how long she has been having only stating that it has been quite a long time.  She denies any coughing or hemoptysis.  She quit smoking in 2000 after 40-pack-year history.  She denies any illicit drugs.  She states that she has chronic daily headaches and back pain for which she takes tramadol at home.  She denies any dysuria, hematuria, hematochezia, melena, hematemesis. In the ED, the patient was afebrile hemodynamically stable with oxygen saturation 92-95% on room air.  WBC 14.5, hemoglobin 11.2, platelets 194,000.  Sodium 140, potassium 3.5, serum creatinine 0.79.  Troponin 17>> 20    Assessment and Plan: * Alcohol withdrawal ([place])- (present on admission) Alcohol level was < 10 Continue CIWA protocol, thiamine, folic acid and multivitamin Continue fall precaution and neuro checks Patient counseled on alcohol withdrawal cessation   Atypical chest pain Troponin 17>>22 Personally reviewed EKG--sinus rhythm, nonspecific T wave change Echocardiogram  Esophagitis This  has resulted in intractable nausea and vomiting Multiple CTs showing esophagitis--likely related to alcohol  Start PPI twice daily -lipase 37 GI consult  Hypertensive urgency Patient was initially started on Cardene drip She was taking her amlodipine at home Presented with blood pressure up to 222/97 Restart amlodipine Wean off nicardipine drip   Leukocytosis- (present on admission) WBC 14.5 Likely stress demargination CT abdomen and pelvis given the patient's abdominal pain  Hypomagnesemia- (present on admission) Replete  Opioid dependence ([place])- (present on admission) As above in chronic pain syndrome  Hypothyroidism- (present on admission) Continue Synthroid  Chronic pain syndrome- (present on admission) PDMP reviewed -Patient receives tramadol 50 mg #30 on 1/11 and 06/13/21 -intermittent ativan Rx, last on 12/28  Depression- (present on admission) Continue Cymbalta  Hypertension- (present on admission) Clonidine was given Continue amlodipine per home regimen         Status is: Observation The patient will require greater than 2 midnights secondary to severity of illness including but not limited to alcohol withdrawal          Family Communication:   no Family at bedside  Consultants:  GI  Code Status:  FULL   DVT Prophylaxis:  SCDs   Procedures: As Listed in Progress Note Above  Antibiotics: None      Subjective: Patient complains of abdominal pain.  She has some nausea but no emesis since admission.  She denies any fevers, chills, headache, neck pain, hematochezia, melena.  She states that her chest pain is little better.  She is chronically short of breath.  Objective: Vitals:   08/27/21 1300 08/27/21 1400 08/27/21 1420 08/27/21 1500  BP: (!) 149/76 (!) 148/59  140/83  Pulse: 86 93 98 93  Resp: 16 14 12 14   Temp:      TempSrc:      SpO2: 90% 95% 97% 96%  Weight:      Height:        Intake/Output Summary (Last 24 hours)  at 08/27/2021 1529 Last data filed at 08/27/2021 1500 Gross per 24 hour  Intake 1879.38 ml  Output --  Net 1879.38 ml   Weight change:  Exam:  General:  Pt is alert, follows commands appropriately, not in acute distress HEENT: No icterus, No thrush, No neck mass, [place]/AT Cardiovascular: RRR, S1/S2, no rubs, no gallops Respiratory: Bibasilar crackles.  No wheezing.  Good air movement Abdomen: Soft/+BS, diffusely tender, non distended, no guarding Extremities: No edema, No lymphangitis, No petechiae, No rashes, no synovitis   Data Reviewed: I have personally reviewed following labs and imaging studies Basic Metabolic Panel: Recent Labs  Lab 08/21/21 0051 08/27/21 0120  NA 145 140  K 3.6 3.5  CL 104 98  CO2 29 29  GLUCOSE 116* 151*  BUN 11 21  CREATININE 0.71 0.79  CALCIUM 8.4* 8.0*  MG  --  1.0*  PHOS  --  4.3   Liver Function Tests: Recent Labs  Lab 08/21/21 0051 08/27/21 0120  AST 26 36  ALT 13 18  ALKPHOS 109 100  BILITOT 0.5 0.5  PROT 6.3* 7.1  ALBUMIN 3.3* 3.5   Recent Labs  Lab 08/27/21 0120  LIPASE 37   No results for input(s): AMMONIA in the last 168 hours. Coagulation Profile: No results for input(s): INR, PROTIME in the last 168 hours. CBC: Recent Labs  Lab 08/20/21 2327 08/27/21 0120  WBC 17.0* 14.5*  NEUTROABS 13.5* 11.8*  HGB 12.6 11.2*  HCT 38.3 34.6*  MCV 81.7 82.2  PLT 390 194   Cardiac Enzymes: No results for input(s): CKTOTAL, CKMB, CKMBINDEX, TROPONINI in the last 168 hours. BNP: Invalid input(s): POCBNP CBG: Recent Labs  Lab 08/26/21 2300  GLUCAP 194*   HbA1C: No results for input(s): HGBA1C in the last 72 hours. Urine analysis:    Component Value Date/Time   COLORURINE STRAW (A) 08/27/2021 0039   APPEARANCEUR CLEAR 08/27/2021 0039   APPEARANCEUR Cloudy (A) 07/01/2021 1354   LABSPEC 1.012 08/27/2021 0039   PHURINE 7.0 08/27/2021 0039   GLUCOSEU 50 (A) 08/27/2021 0039   HGBUR NEGATIVE 08/27/2021 0039   BILIRUBINUR  NEGATIVE 08/27/2021 0039   BILIRUBINUR Negative 07/01/2021 1354   KETONESUR NEGATIVE 08/27/2021 0039   PROTEINUR 30 (A) 08/27/2021 0039   UROBILINOGEN negative 07/01/2015 1541   UROBILINOGEN 0.2 10/07/2012 1928   NITRITE NEGATIVE 08/27/2021 0039   LEUKOCYTESUR NEGATIVE 08/27/2021 0039   Sepsis Labs: @LABRCNTIP (procalcitonin:4,lacticidven:4) ) Recent Results (from the past 240 hour(s))  Resp Panel by RT-PCR (Flu A&B, Covid) Nasopharyngeal Swab     Status: None   Collection Time: 08/27/21  4:54 AM   Specimen: Nasopharyngeal Swab; Nasopharyngeal(NP) swabs in vial transport medium  Result Value Ref Range Status   SARS Coronavirus 2 by RT PCR NEGATIVE NEGATIVE Final    Comment: (NOTE) SARS-CoV-2 target nucleic acids are NOT DETECTED.  The SARS-CoV-2 RNA is generally detectable in upper respiratory specimens during the acute phase of infection. The lowest concentration of SARS-CoV-2 viral copies this assay can detect is 138 copies/mL. A negative result does not preclude SARS-Cov-2 infection and should not be used as the sole basis for treatment or other patient management decisions. A  negative result may occur with  improper specimen collection/handling, submission of specimen other than nasopharyngeal swab, presence of viral mutation(s) within the areas targeted by this assay, and inadequate number of viral copies(<138 copies/mL). A negative result must be combined with clinical observations, patient history, and epidemiological information. The expected result is Negative.  Fact Sheet for Patients:  EntrepreneurPulse.com.au  Fact Sheet for Healthcare Providers:  IncredibleEmployment.be  This test is no t yet approved or cleared by the [place] FDA and  has been authorized for detection and/or diagnosis of SARS-CoV-2 by FDA under an Emergency Use Authorization (EUA). This EUA will remain  in effect (meaning this test can be used) for the  duration of the COVID-19 declaration under Section 564(b)(1) of the Act, 21 U.S.C.section 360bbb-3(b)(1), unless the authorization is terminated  or revoked sooner.       Influenza A by PCR NEGATIVE NEGATIVE Final   Influenza B by PCR NEGATIVE NEGATIVE Final    Comment: (NOTE) The Xpert Xpress SARS-CoV-2/FLU/RSV plus assay is intended as an aid in the diagnosis of influenza from Nasopharyngeal swab specimens and should not be used as a sole basis for treatment. Nasal washings and aspirates are unacceptable for Xpert Xpress SARS-CoV-2/FLU/RSV testing.  Fact Sheet for Patients: EntrepreneurPulse.com.au  Fact Sheet for Healthcare Providers: IncredibleEmployment.be  This test is not yet approved or cleared by the [place] FDA and has been authorized for detection and/or diagnosis of SARS-CoV-2 by FDA under an Emergency Use Authorization (EUA). This EUA will remain in effect (meaning this test can be used) for the duration of the COVID-19 declaration under Section 564(b)(1) of the Act, 21 U.S.C. section 360bbb-3(b)(1), unless the authorization is terminated or revoked.  Performed at [facility], [address], [place], [place] [address]      Scheduled Meds:  amLODipine  5 mg Oral Daily   Chlorhexidine Gluconate Cloth  6 each Topical Q0600   enoxaparin (LOVENOX) injection  40 mg Subcutaneous U04V   folic acid  1 mg Oral Daily   levothyroxine  50 mcg Oral QAC breakfast   multivitamin with minerals  1 tablet Oral Daily   pantoprazole (PROTONIX) IV  40 mg Intravenous Q12H   thiamine  100 mg Oral Daily   Or   thiamine  100 mg Intravenous Daily   Continuous Infusions:  sodium chloride 75 mL/hr at 08/27/21 0849   magnesium sulfate bolus IVPB      Procedures/Studies: CT Angio Chest Pulmonary Embolism (PE) W or WO Contrast  Result Date: 08/21/2021 CLINICAL DATA:  71 year old female with chest pain. EXAM: CT ANGIOGRAPHY CHEST WITH  CONTRAST TECHNIQUE: Multidetector CT imaging of the chest was performed using the standard protocol during bolus administration of intravenous contrast. Multiplanar CT image reconstructions and MIPs were obtained to evaluate the vascular anatomy. RADIATION DOSE REDUCTION: This exam was performed according to the departmental dose-optimization program which includes automated exposure control, adjustment of the mA and/or kV according to patient size and/or use of iterative reconstruction technique. CONTRAST:  22mL OMNIPAQUE IOHEXOL 350 MG/ML SOLN COMPARISON:  Portable chest 08/20/2021. Chest CT 02/28/2021. FINDINGS: Cardiovascular: Good contrast bolus timing in the pulmonary arterial tree. Mild respiratory motion at the lung bases. No focal filling defect identified in the pulmonary arteries to suggest acute pulmonary embolism. Extensive Calcified aortic atherosclerosis. 4 vessel arch configuration (normal variant). Calcified coronary artery atherosclerosis. Stable heart size, no significant cardiomegaly. No pericardial effusion. Mediastinum/Nodes: No mediastinal mass or lymphadenopathy. But there is abnormal circumferential wall thickening of the thoracic  esophagus which appears indistinct (series 5, image 288). Lungs/Pleura: Lower lung volumes and atelectatic changes to the major airways which remain patent. Linear chronic scarring in the anteromedial right upper lobe. Minor atelectasis. No pleural effusion or other abnormal pulmonary opacity. Upper Abdomen: Grossly negative visible liver, spleen, pancreas, adrenal glands, left kidney and bowel in the upper abdomen. Musculoskeletal: No acute osseous abnormality identified. Review of the MIP images confirms the above findings. IMPRESSION: 1. Negative for acute pulmonary embolus. 2. Evidence of Acute Esophagitis, with abnormal circumferential mural thickening most pronounced in the distal esophagus. 3. No other acute finding in the chest. 4. Calcified coronary artery  and Aortic Atherosclerosis (ICD10-I70.0). Electronically Signed   By: Genevie Ann M.D.   On: 08/21/2021 05:15   DG Chest Portable 1 View  Result Date: 08/21/2021 CLINICAL DATA:  Shortness of breath. EXAM: PORTABLE CHEST 1 VIEW COMPARISON:  Radiograph yesterday, chest CTA earlier today. FINDINGS: Stable heart size and mediastinal contours. Aortic atherosclerosis. Mild chronic interstitial coarsening without acute airspace disease. No pleural effusion, pneumothorax, or pulmonary edema. Stable osseous structures. IMPRESSION: No acute radiographic findings. Electronically Signed   By: Keith Rake M.D.   On: 08/21/2021 20:28   DG Chest Port 1 View  Result Date: 08/20/2021 CLINICAL DATA:  Chest pain and shortness of breath. EXAM: PORTABLE CHEST 1 VIEW COMPARISON:  Radiograph 06/28/2021, chest CT 08/19/2020 FINDINGS: Stable heart size and mediastinal contours. Aortic atherosclerosis. Chronic interstitial coarsening without confluent consolidation no pulmonary edema, pleural effusion, or pneumothorax. No acute osseous abnormalities are seen. IMPRESSION: No acute chest findings. Electronically Signed   By: Keith Rake M.D.   On: 08/20/2021 23:28    Orson Eva, DO  [facility]  If 7PM-7AM, please contact night-coverage [email or web address] Password [email or web address] 08/27/2021, 3:29 PM   LOS: 0 days

## 2021-08-27 NOTE — Assessment & Plan Note (Deleted)
Continue Protonix °

## 2021-08-27 NOTE — Assessment & Plan Note (Addendum)
WBC 14.5 Due to stress demargination 2/25 CT abdomen and pelvis--no acute findings; chronic distal esophageal thickening Improved without antibiotics

## 2021-08-27 NOTE — Assessment & Plan Note (Signed)
PDMP reviewed -Patient receives tramadol 50 mg #30 on 1/11 and 06/13/21 -intermittent ativan Rx, last on 12/28

## 2021-08-27 NOTE — Assessment & Plan Note (Addendum)
Troponin 17>>22 Personally reviewed EKG--sinus rhythm, nonspecific T wave change 2/27 Echo--EF 60-65%, no WMA, G1DD Likely due to GI etiology/esophagitis

## 2021-08-27 NOTE — Assessment & Plan Note (Signed)
Replete

## 2021-08-27 NOTE — Assessment & Plan Note (Addendum)
Patient was initially started on Cardene drip She was taking her amlodipine at home Presented with blood pressure up to 222/97 Restart amlodipine>>increase dose Weaned off nicardipine drip

## 2021-08-27 NOTE — H&P (Addendum)
History and Physical    Patient: Stacy Dalton UEK:[id number] DOB: April 14, 1951 DOA: 08/26/2021 DOS: the patient was seen and examined on 08/27/2021 PCP: [name], FNP  Patient coming from: Home  Chief Complaint:  Chief Complaint  Patient presents with   Withdrawal    HPI: Stacy Dalton is a 71 y.o. female with medical history significant for severe alcohol dependence, recurrent DTs, COPD, hypertension, hypothyroidism, chronic pain, depression, and anxiety who presented to the ER by EMS due to alcohol withdrawal symptoms.  Patient states that she had her last drink (vodka) this morning and that she has since developed generalized abdominal pain associated with nausea and nonbloody vomiting.  Patient states that she wanted to quit alcohol use and that her family were trying to get into a rehab.  She denies chest pain, fever, chills, headache, blurry vision.  ED course: In the emergency department, she was initially tachypneic and tachycardic.  BP was 181/99, she was intermittently tachypneic and was tachycardic.  Work-up in ED showed normocytic anemia, leukocytosis, hyperglycemia.  Lipase 37, troponin x 2 - 17 > 22. BAL < 10.  Influenza A, B, SARS coronavirus was negative. IV Ativan was given.  Hospitalist was asked to admit patient for further evaluation and management.   Review of Systems: As mentioned in the history of present illness. All other systems reviewed and are negative.  Past Medical History:  Diagnosis Date   Chronic pain    COPD (chronic obstructive pulmonary disease) ([place])    told has copd, no current inhaler use   Cough    Depression    ETOH abuse    GERD (gastroesophageal reflux disease)    Hypertension    Hypothyroidism    Insomnia    Migraine    Migraine    Osteopenia    Pain management    Panic attacks    Small bowel obstruction ([place])    Past Surgical History:  Procedure Laterality Date   ABDOMINAL HYSTERECTOMY     COLON SURGERY      COLOSTOMY CLOSURE  04/19/2012   Procedure: COLOSTOMY CLOSURE;  Surgeon: Adin Hector, MD;  Location: [facility];  Service: General;  Laterality: N/A;  Laparotomy, Resection and Closure of Colostomy   COLOSTOMY TAKEDOWN N/A 04/12/2016   Procedure: [place] TAKEDOWN;  Surgeon: Johnathan Hausen, MD;  Location: [facility];  Service: General;  Laterality: N/A;   INCONTINENCE SURGERY     LAPAROTOMY  10/04/2011, colostomy also   Procedure: EXPLORATORY LAPAROTOMY;  Surgeon: Adin Hector, MD;  Location: [facility];  Service: General;  Laterality: N/A;  left partial colectomy with colostomy   LAPAROTOMY  04/19/2012   Procedure: EXPLORATORY LAPAROTOMY;  Surgeon: Adin Hector, MD;  Location: [facility];  Service: General;  Laterality: N/A;   LAPAROTOMY N/A 11/29/2015   Procedure: EXPLORATORY LAPAROTOMY; SUBTOTAL COLECTOMY WITH HARTMAN PROCEDURE AND END COLOSTOMY;  Surgeon: Johnathan Hausen, MD;  Location: [facility];  Service: General;  Laterality: N/A;   TUBAL LIGATION     VENTRAL HERNIA REPAIR  04/19/2012   Procedure: HERNIA REPAIR VENTRAL ADULT;  Surgeon: Adin Hector, MD;  Location: [facility];  Service: General;  Laterality: N/A;   Social History:  reports that she quit smoking about 22 years ago. Her smoking use included cigarettes. She has a 30.00 pack-year smoking history. She has never used smokeless tobacco. She reports current alcohol use. She reports that she does not use drugs.  Allergies  Allergen Reactions   Librium [Chlordiazepoxide]  Became unresponsive   Toradol [Ketorolac Tromethamine] Shortness Of Breath   Butalbital-Apap-Caffeine Other (See Comments)    jittery   Demerol Swelling   Esgic [Butalbital-Apap-Caffeine] Other (See Comments)    jittery   Iodine Other (See Comments)    bp bottomed out per pt several hours later; unsure if pre medicated in past with cm; done in [place]   Pyridium [Phenazopyridine] Nausea Only    Family History  Problem Relation Age of Onset   Heart disease Mother     Hypertension Mother    Cancer Sister        sinus   Diabetes Brother    Heart disease Brother    Thyroid disease Brother    Cancer Sister        abdominal ?   Thyroid disease Sister    Cancer Other        GE junction adenocarcinoma   Emphysema Father    Stroke Father    Hypertension Father    Heart disease Father    COPD Sister    Thyroid disease Sister    Thyroid disease Sister    Diabetes Brother    Thyroid disease Brother    Thyroid disease Brother    Hypertension Brother    Post-traumatic stress disorder Brother    COPD Brother    Thyroid disease Brother    Thyroid disease Brother    Hypertension Brother    Transient ischemic attack Brother     Prior to Admission medications   Medication Sig Start Date End Date Taking? Authorizing Provider  albuterol (VENTOLIN HFA) 108 (90 Base) MCG/ACT inhaler Inhale 2 puffs into the lungs every 4 (four) hours as needed for wheezing or shortness of breath. 08/21/21   Orpah Greek, MD  amLODipine (NORVASC) 5 MG tablet Take 1 tablet (5 mg total) by mouth daily. 05/22/21   Regalado, Belkys A, MD  amoxicillin (AMOXIL) 500 MG capsule Take 1 capsule (500 mg total) by mouth 3 (three) times daily. 08/21/21   Milton Ferguson, MD  doxycycline (VIBRAMYCIN) 100 MG capsule Take 1 capsule (100 mg total) by mouth 2 (two) times daily. 08/21/21   Orpah Greek, MD  DULoxetine (CYMBALTA) 60 MG capsule Take 1 capsule (60 mg total) by mouth daily. 08/08/21   Evelina Dun A, FNP  fluticasone-salmeterol (ADVAIR DISKUS) 250-50 MCG/ACT AEPB Inhale 1 puff into the lungs in the morning and at bedtime. Patient not taking: Reported on 05/25/2021 04/19/21 05/20/21  Deatra James, MD  folic acid (FOLVITE) 1 MG tablet Take 1 tablet (1 mg total) by mouth daily. 05/22/21   Regalado, Belkys A, MD  ibuprofen (ADVIL) 800 MG tablet Take 1 tablet (800 mg total) by mouth every 8 (eight) hours as needed. 08/21/21   Milton Ferguson, MD  levothyroxine  (SYNTHROID) 50 MCG tablet Take 1 tablet (50 mcg total) by mouth daily before breakfast. 06/22/20   [name], FNP  Multiple Vitamin (MULTIVITAMIN WITH MINERALS) TABS tablet Take 1 tablet by mouth daily. 05/22/21   Regalado, Belkys A, MD  ondansetron (ZOFRAN) 4 MG tablet Take 1 tablet (4 mg total) by mouth every 8 (eight) hours as needed for nausea or vomiting. 08/08/21   Evelina Dun A, FNP  pantoprazole (PROTONIX) 40 MG tablet Take 1 tablet (40 mg total) by mouth daily. 06/13/21 06/13/22  Barton Dubois, MD  potassium chloride SA (KLOR-CON M) 20 MEQ tablet Take 1 tablet (20 mEq total) by mouth 2 (two) times daily. 95/63/87   Roxanne Mins,  Shanon Brow, MD  predniSONE (DELTASONE) 20 MG tablet Take 2 tablets (40 mg total) by mouth daily with breakfast. 08/21/21   Pollina, Gwenyth Allegra, MD  thiamine 100 MG tablet Take 1 tablet (100 mg total) by mouth daily. 05/22/21   Regalado, Belkys A, MD  vitamin B-12 (CYANOCOBALAMIN) 1000 MCG tablet Take 1 tablet (1,000 mcg total) by mouth daily. 05/21/21   Regalado, Belkys A, MD  Vitamin D, Ergocalciferol, (DRISDOL) 1.25 MG (50000 UNIT) CAPS capsule Take 1 capsule (50,000 Units total) by mouth every 7 (seven) days. 06/22/20   [name], FNP    Physical Exam: Vitals:   08/27/21 0440 08/27/21 0530 08/27/21 0600 08/27/21 0700  BP: (!) 184/95 (!) 192/112 (!) 178/98   Pulse: (!) 107 (!) 109 (!) 104   Resp:  (!) 22 13   Temp:    98.1 F (36.7 C)  TempSrc:    Oral  SpO2:  97% 92%   Weight:    52.7 kg  Height:    5\' 1"  (1.549 m)    General: Elderly female. Awake and alert and oriented x3. Not in any acute distress.  HEENT: NCAT.  PERRLA. EOMI. Sclerae anicteric.  Moist mucosal membranes. Neck: Neck supple without lymphadenopathy. No carotid bruits. No masses palpated.  Cardiovascular: Tachycardia.  Regular rate with normal S1-S2 sounds. No murmurs, rubs or gallops auscultated. No JVD.  Respiratory: Tachypnea.  Clear breath sounds.  No accessory muscle  use. Abdomen: Soft, nontender, nondistended. Active bowel sounds. No masses or hepatosplenomegaly  Skin: No rashes, lesions, or ulcerations.  Dry, warm to touch. Musculoskeletal: Mild tremors noted in outstretched hands. 2+ dorsalis pedis and radial pulses. Good ROM.   Psychiatric: Intact judgment and insight.  Mood appropriate to current condition. Neurologic: No focal neurological deficits. Strength is 5/5 x 4.  CN II - XII grossly intact.   Data Reviewed: EKG personally reviewed sinus tachycardia at a rate of 103 bpm with paired PVCs  Assessment and Plan: * Alcohol withdrawal ([place])- (present on admission) Alcohol level was < 10 Continue CIWA protocol, thiamine, folic acid and multivitamin Continue fall precaution and neuro checks Patient will be counseled on alcohol withdrawal cessation when more stable   Hypertensive urgency Clonidine was given Patient's BP continued to stay elevated, patient will be started on IV nicardipine drip, with goal to wean patient off the drip and transition to home amlodipine.   Hypertension- (present on admission) Clonidine was given Continue amlodipine per home regimen  Leukocytosis- (present on admission) WBC 14.5, this is possibly reactive Continue to monitor WBC with morning labs  GERD (gastroesophageal reflux disease)- (present on admission) Continue Protonix  Hypothyroidism- (present on admission) Continue Synthroid    Advance Care Planning: CODE STATUS: Full code  Consults: None  Family Communication: None at bedside  Severity of Illness: The appropriate patient status for this patient is OBSERVATION. Observation status is judged to be reasonable and necessary in order to provide the required intensity of service to ensure the patient's safety. The patient's presenting symptoms, physical exam findings, and initial radiographic and laboratory data in the context of their medical condition is felt to place them at decreased risk for  further clinical deterioration. Furthermore, it is anticipated that the patient will be medically stable for discharge from the hospital within 2 midnights of admission.   Author: Bernadette Hoit, DO 08/27/2021 7:38 AM  For on call review [email or web address].

## 2021-08-27 NOTE — Assessment & Plan Note (Addendum)
This has resulted in intractable nausea and vomiting Multiple CTs showing esophagitis--likely related to alcohol  -lipase 37 GI consult appreciated>>planning EGD 2/27 -Hgb has remained stable throughout hospitaliztion 2/27 EGD--Grade C reflux Esophagitis (no bleeding), Schatzki ring, gastritis Increase protonix to bid, add carafate ac/hs Repeat EGD in 8 weeks with biopsies Tolerating diet without N/V

## 2021-08-27 NOTE — ED Provider Notes (Signed)
[facility] EMERGENCY DEPARTMENT Provider Note   CSN: [id number] Arrival date & time: 08/26/21  2251     History  Chief Complaint  Patient presents with   Withdrawal    Stacy Dalton is a 71 y.o. female.  Patient is a 71 year old female with extensive past medical history including chronic alcoholism with recurrent DTs, withdrawal, COPD, hypertension, hyperlipidemia.  Patient brought by EMS for evaluation of "hurting all over.  Patient had her last drink this morning.  Since that time she reports nausea, vomiting, and generalized abdominal pain.  She is very anxious, shaky, and tremulous upon presentation.  She denies fevers or chills.  She denies bloody stool or vomit.  Patient well-known to the emergency department for similar presentations.  The history is provided by the patient.      Home Medications Prior to Admission medications   Medication Sig Start Date End Date Taking? Authorizing Provider  albuterol (VENTOLIN HFA) 108 (90 Base) MCG/ACT inhaler Inhale 2 puffs into the lungs every 4 (four) hours as needed for wheezing or shortness of breath. 08/21/21   Orpah Greek, MD  amLODipine (NORVASC) 5 MG tablet Take 1 tablet (5 mg total) by mouth daily. 05/22/21   Regalado, Belkys A, MD  amoxicillin (AMOXIL) 500 MG capsule Take 1 capsule (500 mg total) by mouth 3 (three) times daily. 08/21/21   Milton Ferguson, MD  doxycycline (VIBRAMYCIN) 100 MG capsule Take 1 capsule (100 mg total) by mouth 2 (two) times daily. 08/21/21   Orpah Greek, MD  DULoxetine (CYMBALTA) 60 MG capsule Take 1 capsule (60 mg total) by mouth daily. 08/08/21   Evelina Dun A, FNP  fluticasone-salmeterol (ADVAIR DISKUS) 250-50 MCG/ACT AEPB Inhale 1 puff into the lungs in the morning and at bedtime. Patient not taking: Reported on 05/25/2021 04/19/21 05/20/21  Deatra James, MD  folic acid (FOLVITE) 1 MG tablet Take 1 tablet (1 mg total) by mouth daily. 05/22/21   Regalado, Belkys A, MD   ibuprofen (ADVIL) 800 MG tablet Take 1 tablet (800 mg total) by mouth every 8 (eight) hours as needed. 08/21/21   Milton Ferguson, MD  levothyroxine (SYNTHROID) 50 MCG tablet Take 1 tablet (50 mcg total) by mouth daily before breakfast. 06/22/20   [name], FNP  Multiple Vitamin (MULTIVITAMIN WITH MINERALS) TABS tablet Take 1 tablet by mouth daily. 05/22/21   Regalado, Belkys A, MD  ondansetron (ZOFRAN) 4 MG tablet Take 1 tablet (4 mg total) by mouth every 8 (eight) hours as needed for nausea or vomiting. 08/08/21   Evelina Dun A, FNP  pantoprazole (PROTONIX) 40 MG tablet Take 1 tablet (40 mg total) by mouth daily. 06/13/21 06/13/22  Barton Dubois, MD  potassium chloride SA (KLOR-CON M) 20 MEQ tablet Take 1 tablet (20 mEq total) by mouth 2 (two) times daily. 48/18/56   Delora Fuel, MD  predniSONE (DELTASONE) 20 MG tablet Take 2 tablets (40 mg total) by mouth daily with breakfast. 08/21/21   Pollina, Gwenyth Allegra, MD  thiamine 100 MG tablet Take 1 tablet (100 mg total) by mouth daily. 05/22/21   Regalado, Belkys A, MD  vitamin B-12 (CYANOCOBALAMIN) 1000 MCG tablet Take 1 tablet (1,000 mcg total) by mouth daily. 05/21/21   Regalado, Belkys A, MD  Vitamin D, Ergocalciferol, (DRISDOL) 1.25 MG (50000 UNIT) CAPS capsule Take 1 capsule (50,000 Units total) by mouth every 7 (seven) days. 06/22/20   [name], FNP      Allergies    Librium [chlordiazepoxide],  Toradol [ketorolac tromethamine], Butalbital-apap-caffeine, Demerol, Esgic [butalbital-apap-caffeine], Iodine, and Pyridium [phenazopyridine]    Review of Systems   Review of Systems  All other systems reviewed and are negative.  Physical Exam Updated Vital Signs BP (!) 160/114    Pulse (!) 124    Temp 98.1 F (36.7 C) (Oral)    Resp (!) 28    Ht 5\' 4"  (1.626 m)    Wt 51.7 kg    SpO2 98%    BMI 19.57 kg/m  Physical Exam Vitals and nursing note reviewed.  Constitutional:      General: She is not in acute distress.     Appearance: She is well-developed. She is not diaphoretic.     Comments: Patient appears very anxious and tremulous.  She is actively vomiting.  HENT:     Head: Normocephalic and atraumatic.  Cardiovascular:     Rate and Rhythm: Normal rate and regular rhythm.     Heart sounds: No murmur heard.   No friction rub. No gallop.  Pulmonary:     Effort: Pulmonary effort is normal. No respiratory distress.     Breath sounds: Normal breath sounds. No wheezing.  Abdominal:     General: Bowel sounds are normal. There is no distension.     Palpations: Abdomen is soft.     Tenderness: There is no abdominal tenderness.  Musculoskeletal:        General: Normal range of motion.     Cervical back: Normal range of motion and neck supple.  Skin:    General: Skin is warm and dry.  Neurological:     General: No focal deficit present.     Mental Status: She is alert and oriented to person, place, and time.    ED Results / Procedures / Treatments   Labs (all labs ordered are listed, but only abnormal results are displayed) Labs Reviewed  CBG MONITORING, ED - Abnormal; Notable for the following components:      Result Value   Glucose-Capillary 194 (*)    All other components within normal limits    EKG EKG Interpretation  Date/Time:  Friday August 26 2021 23:00:10 EST Ventricular Rate:  123 PR Interval:  122 QRS Duration: 85 QT Interval:  318 QTC Calculation: 455 R Axis:   74 Text Interpretation: Sinus tachycardia Paired ventricular premature complexes Confirmed by Veryl Speak [phone]) on 08/26/2021 11:04:10 PM  Radiology No results found.  Procedures Procedures  Continuous cardiac monitoring  Medications Ordered in ED Medications  ondansetron (ZOFRAN) injection 4 mg (has no administration in time range)  LORazepam (ATIVAN) injection 2 mg (has no administration in time range)  sodium chloride 0.9 % bolus 1,000 mL (has no administration in time range)    ED Course/ Medical  Decision Making/ A&P  This patient presents to the ED for concern of vomiting, shakiness, this involves an extensive number of treatment options, and is a complaint that carries with it a high risk of complications and morbidity.  The differential diagnosis includes alcohol withdrawal, pancreatitis, cholecystitis, acute gastroenteritis   Co morbidities that complicate the patient evaluation  Chronic alcoholism   Additional history obtained:  No additional history or outside records obtained   Lab Tests:  I Ordered, and personally interpreted labs.  The pertinent results include: No significant abnormalities with CT BC, metabolic panel, and lipase, and LFTs   Imaging Studies ordered:  No imaging studies ordered or indicated   Cardiac Monitoring:  The patient was maintained on a cardiac monitor.  I personally viewed and interpreted the cardiac monitored which showed an underlying rhythm of: Sinus tachycardia   Medicines ordered and prescription drug management:  I ordered medication including Ativan for withdrawal and Zofran for nausea Reevaluation of the patient after these medicines showed that the patient improved I have reviewed the patients home medicines and have made adjustments as needed   Test Considered:  No other test considered   Critical Interventions:  IV fluids and medications as above   Consultations Obtained:  I requested consultation with the hospitalist, Dr. Josephine Cables,  and discussed lab and imaging findings as well as pertinent plan - they recommend: Admission for treatment of apparent withdrawal/DTs   Problem List / ED Course:  Patient presenting here with abdominal pain, vomiting, and weakness.  She arrives here very tremulous and appears to be withdrawing from alcohol.  She is markedly hypertensive and tachycardic.  This improved somewhat after receiving Ativan, fluids, and Zofran, but she remains hypertensive and I believe will require  admission.  I spoke with Dr. Josephine Cables who agrees to admit.  Patient placed on CIWA protocol.   Reevaluation:  After the interventions noted above, I reevaluated the patient and found that they have : Improved   Social Determinants of Health:  Chronic alcoholism   Dispostion:  After consideration of the diagnostic results and the patients response to treatment, I feel that the patent would benefit from admission to the hospital for treatment of DTs.   CRITICAL CARE Performed by: Veryl Speak Total critical care time: 35 minutes Critical care time was exclusive of separately billable procedures and treating other patients. Critical care was necessary to treat or prevent imminent or life-threatening deterioration. Critical care was time spent personally by me on the following activities: development of treatment plan with patient and/or surrogate as well as nursing, discussions with consultants, evaluation of patient's response to treatment, examination of patient, obtaining history from patient or surrogate, ordering and performing treatments and interventions, ordering and review of laboratory studies, ordering and review of radiographic studies, pulse oximetry and re-evaluation of patient's condition.    Final Clinical Impression(s) / ED Diagnoses Final diagnoses:  None    Rx / DC Orders ED Discharge Orders     None         Veryl Speak, MD 08/27/21 [phone]

## 2021-08-28 ENCOUNTER — Other Ambulatory Visit (HOSPITAL_COMMUNITY): Payer: TRICARE For Life (TFL)

## 2021-08-28 DIAGNOSIS — F10930 Alcohol use, unspecified with withdrawal, uncomplicated: Secondary | ICD-10-CM

## 2021-08-28 DIAGNOSIS — I160 Hypertensive urgency: Secondary | ICD-10-CM | POA: Diagnosis not present

## 2021-08-28 DIAGNOSIS — R112 Nausea with vomiting, unspecified: Secondary | ICD-10-CM | POA: Diagnosis not present

## 2021-08-28 DIAGNOSIS — F1120 Opioid dependence, uncomplicated: Secondary | ICD-10-CM

## 2021-08-28 DIAGNOSIS — G894 Chronic pain syndrome: Secondary | ICD-10-CM | POA: Diagnosis not present

## 2021-08-28 DIAGNOSIS — R933 Abnormal findings on diagnostic imaging of other parts of digestive tract: Secondary | ICD-10-CM | POA: Diagnosis not present

## 2021-08-28 DIAGNOSIS — K209 Esophagitis, unspecified without bleeding: Secondary | ICD-10-CM | POA: Diagnosis not present

## 2021-08-28 LAB — COMPREHENSIVE METABOLIC PANEL
ALT: 14 U/L (ref 0–44)
AST: 25 U/L (ref 15–41)
Albumin: 2.7 g/dL — ABNORMAL LOW (ref 3.5–5.0)
Alkaline Phosphatase: 81 U/L (ref 38–126)
Anion gap: 7 (ref 5–15)
BUN: 18 mg/dL (ref 8–23)
CO2: 27 mmol/L (ref 22–32)
Calcium: 7.4 mg/dL — ABNORMAL LOW (ref 8.9–10.3)
Chloride: 99 mmol/L (ref 98–111)
Creatinine, Ser: 1.03 mg/dL — ABNORMAL HIGH (ref 0.44–1.00)
GFR, Estimated: 57 mL/min — ABNORMAL LOW (ref >60)
Glucose, Bld: 106 mg/dL — ABNORMAL HIGH (ref 70–99)
Potassium: 3.9 mmol/L (ref 3.5–5.1)
Sodium: 133 mmol/L — ABNORMAL LOW (ref 135–145)
Total Bilirubin: 0.8 mg/dL (ref 0.3–1.2)
Total Protein: 5.2 g/dL — ABNORMAL LOW (ref 6.5–8.1)

## 2021-08-28 LAB — CBC
HCT: 29.2 % — ABNORMAL LOW (ref 36.0–46.0)
Hemoglobin: 9.1 g/dL — ABNORMAL LOW (ref 12.0–15.0)
MCH: 26.1 pg (ref 26.0–34.0)
MCHC: 31.2 g/dL (ref 30.0–36.0)
MCV: 83.9 fL (ref 80.0–100.0)
Platelets: 151 10*3/uL (ref 150–400)
RBC: 3.48 MIL/uL — ABNORMAL LOW (ref 3.87–5.11)
RDW: 18.7 % — ABNORMAL HIGH (ref 11.5–15.5)
WBC: 6.8 10*3/uL (ref 4.0–10.5)
nRBC: 0 % (ref 0.0–0.2)

## 2021-08-28 LAB — MAGNESIUM: Magnesium: 1.9 mg/dL (ref 1.7–2.4)

## 2021-08-28 LAB — HEMOGLOBIN A1C
Hgb A1c MFr Bld: 5.2 % (ref 4.8–5.6)
Mean Plasma Glucose: 102.54 mg/dL

## 2021-08-28 MED ORDER — ACETAMINOPHEN 500 MG PO TABS
500.0000 mg | ORAL_TABLET | Freq: Four times a day (QID) | ORAL | Status: DC | PRN
  Administered 2021-08-28 (×2): 500 mg via ORAL
  Filled 2021-08-28 (×2): qty 1

## 2021-08-28 NOTE — Plan of Care (Signed)

## 2021-08-28 NOTE — H&P (View-Only) (Signed)
Referring Provider: No ref. provider found Primary Care Physician:  [name], FNP Primary Gastroenterologist:  Dr. Laural Golden  Reason for Consultation:    Nausea and vomiting. Thickened distal esophageal wall on CT.  HPI:   Patient is 71 year old Caucasian female who is brought to emergency room by EMS on the night of 08/26/2021.  She was having symptoms of alcohol withdrawal she also had been having nausea vomiting and diarrhea.  Her ethanol level was less than 10.  Her last ethanol level was 324 on 06/26/2021.  Her lab studies revealed WBC of 14.5 with H&H of 11.2 and 34.6.  MCV was 82.8.  Serum lipase was 37 respiratory panel was negative.  Chemistry panel was normal except glucose was 151. CT abdomen and pelvis was obtained and revealed no acute abnormalities.  There was thickening to distal esophageal wall just like on prior study of 06/23/2021.  She also had evidence of sigmoid colon surgery. Patient was admitted to ICU.  She was begun on IV fluids IV lorazepam IV pantoprazole and was given folate and thiamine. Her nausea vomiting and diarrhea resolved.  She was begun on regular diet this morning.  Repeat H&H from this morning was 9.1 and 29.2.  Patient feels much better this afternoon.  She has not experienced nausea vomiting or diarrhea today. Patient gives history of heartburn for which she was taking medicine in the past.  Lately she has not taken any.  She states she is trying to watch her diet.  No history of hematemesis melena or rectal bleeding.  She also denies hematuria.  She says she has been drinking vodka large amount for about 2 years.  She stated she started drinking in order to get relief of back pain.  She had been on tramadol before but decided to quit on her own.  She says her appetite is not good.  She has lost 10 to 15 pounds.  She states her baseline weight has been around 130 pounds. Review of the systems is also positive for severe headache.  She says she has never  been diagnosed with migraine.   Patient is widowed.  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  She is retired.  She smokes cigarettes for about 20 years 2 to 3 packs/day but quit 22 years ago.  Patient states she has been drinking vodka for about 2 years.  She says she has chronic low back pain.  She stopped tramadol and decided to get relief by drinking alcohol.  She has 5 sons.  On her son is benign brain tumor but they are all doing well. Both parents died in her 35s.  They both had stroke. She had 4 brothers and 5 sisters and only 1 sister is living.  2 sisters had unknown malignancy and one had COPD.  She says her brothers are doing well.  Past Medical History:  Diagnosis Date   Chronic pain    COPD (chronic obstructive pulmonary disease) ([place])    told has copd, no current inhaler use   Cough    Depression    ETOH abuse    GERD (gastroesophageal reflux disease)    Hypertension    Hypothyroidism    Insomnia    Migraine    Migraine    Osteopenia    Pain management    Panic attacks    Small bowel obstruction (HCC)     Past Surgical History:  Procedure Laterality Date   ABDOMINAL HYSTERECTOMY  COLON SURGERY     COLOSTOMY CLOSURE  04/19/2012   Procedure: COLOSTOMY CLOSURE;  Surgeon: Adin Hector, MD;  Location: [facility];  Service: General;  Laterality: N/A;  Laparotomy, Resection and Closure of Colostomy   COLOSTOMY TAKEDOWN N/A 04/12/2016   Procedure: [place] TAKEDOWN;  Surgeon: Johnathan Hausen, MD;  Location: [facility];  Service: General;  Laterality: N/A;   INCONTINENCE SURGERY     LAPAROTOMY  10/04/2011, colostomy also   Procedure: EXPLORATORY LAPAROTOMY;  Surgeon: Adin Hector, MD;  Location: [facility];  Service: General;  Laterality: N/A;  left partial colectomy with colostomy   LAPAROTOMY  04/19/2012   Procedure: EXPLORATORY LAPAROTOMY;  Surgeon: Adin Hector, MD;  [facility]: [facility];  Service: General;  Laterality: N/A;   LAPAROTOMY N/A  11/29/2015   Procedure: EXPLORATORY LAPAROTOMY; SUBTOTAL COLECTOMY WITH HARTMAN PROCEDURE AND END COLOSTOMY;  Surgeon: Johnathan Hausen, MD;  Location: [facility];  Service: General;  Laterality: N/A;   TUBAL LIGATION     VENTRAL HERNIA REPAIR  04/19/2012   Procedure: HERNIA REPAIR VENTRAL ADULT;  Surgeon: Adin Hector, MD;  Location: [facility];  Service: General;  Laterality: N/A;    Prior to Admission medications   Medication Sig Start Date End Date Taking? Authorizing Provider  albuterol (VENTOLIN HFA) 108 (90 Base) MCG/ACT inhaler Inhale 2 puffs into the lungs every 4 (four) hours as needed for wheezing or shortness of breath. 08/21/21   Orpah Greek, MD  amLODipine (NORVASC) 5 MG tablet Take 1 tablet (5 mg total) by mouth daily. 05/22/21   Regalado, Belkys A, MD  amoxicillin (AMOXIL) 500 MG capsule Take 1 capsule (500 mg total) by mouth 3 (three) times daily. 08/21/21   Milton Ferguson, MD  doxycycline (VIBRAMYCIN) 100 MG capsule Take 1 capsule (100 mg total) by mouth 2 (two) times daily. 08/21/21   Orpah Greek, MD  DULoxetine (CYMBALTA) 60 MG capsule Take 1 capsule (60 mg total) by mouth daily. 08/08/21   Evelina Dun A, FNP  fluticasone-salmeterol (ADVAIR DISKUS) 250-50 MCG/ACT AEPB Inhale 1 puff into the lungs in the morning and at bedtime. Patient not taking: Reported on 05/25/2021 04/19/21 05/20/21  Deatra James, MD  folic acid (FOLVITE) 1 MG tablet Take 1 tablet (1 mg total) by mouth daily. 05/22/21   Regalado, Belkys A, MD  ibuprofen (ADVIL) 800 MG tablet Take 1 tablet (800 mg total) by mouth every 8 (eight) hours as needed. 08/21/21   Milton Ferguson, MD  levothyroxine (SYNTHROID) 50 MCG tablet Take 1 tablet (50 mcg total) by mouth daily before breakfast. 06/22/20   [name], FNP  Multiple Vitamin (MULTIVITAMIN WITH MINERALS) TABS tablet Take 1 tablet by mouth daily. 05/22/21   Regalado, Belkys A, MD  ondansetron (ZOFRAN) 4 MG tablet Take 1 tablet (4 mg total)  by mouth every 8 (eight) hours as needed for nausea or vomiting. 08/08/21   Evelina Dun A, FNP  pantoprazole (PROTONIX) 40 MG tablet Take 1 tablet (40 mg total) by mouth daily. 06/13/21 06/13/22  Barton Dubois, MD  potassium chloride SA (KLOR-CON M) 20 MEQ tablet Take 1 tablet (20 mEq total) by mouth 2 (two) times daily. 22/02/54   Delora Fuel, MD  predniSONE (DELTASONE) 20 MG tablet Take 2 tablets (40 mg total) by mouth daily with breakfast. 08/21/21   Pollina, Gwenyth Allegra, MD  thiamine 100 MG tablet Take 1 tablet (100 mg total) by mouth daily. 05/22/21   Regalado, Cassie Freer, MD  vitamin B-12 (CYANOCOBALAMIN)  1000 MCG tablet Take 1 tablet (1,000 mcg total) by mouth daily. 05/21/21   Regalado, Belkys A, MD  Vitamin D, Ergocalciferol, (DRISDOL) 1.25 MG (50000 UNIT) CAPS capsule Take 1 capsule (50,000 Units total) by mouth every 7 (seven) days. 06/22/20   [name], FNP    Current Facility-Administered Medications  Medication Dose Route Frequency Provider Last Rate Last Admin   0.9 % NaCl with KCl 20 mEq/ L  infusion   Intravenous Continuous Tat, David, MD 75 mL/hr at 08/28/21 0823 New Bag at 08/28/21 3825   acetaminophen (TYLENOL) tablet 1,000 mg  1,000 mg Oral Q6H PRN Tat, Shanon Brow, MD   1,000 mg at 08/27/21 1426   amLODipine (NORVASC) tablet 5 mg  5 mg Oral Daily Tat, David, MD   5 mg at 08/28/21 0539   Chlorhexidine Gluconate Cloth 2 % PADS 6 each  6 each Topical Q0600 Adefeso, Oladapo, DO   6 each at 08/28/21 7673   docusate (COLACE) 50 MG/5ML liquid 100 mg  100 mg Oral Daily PRN Adefeso, Oladapo, DO   100 mg at 08/27/21 2005   DULoxetine (CYMBALTA) DR capsule 60 mg  60 mg Oral Daily Tat, Shanon Brow, MD   60 mg at 08/28/21 0942   enoxaparin (LOVENOX) injection 40 mg  40 mg Subcutaneous Q24H Adefeso, Oladapo, DO   40 mg at 41/93/79 0240   folic acid (FOLVITE) tablet 1 mg  1 mg Oral Daily Adefeso, Oladapo, DO   1 mg at 08/28/21 9735   levothyroxine (SYNTHROID) tablet 50 mcg  50 mcg Oral QAC  breakfast Adefeso, Oladapo, DO   50 mcg at 08/28/21 3299   LORazepam (ATIVAN) tablet 1-4 mg  1-4 mg Oral Q1H PRN Adefeso, Oladapo, DO   2 mg at 08/28/21 2426   Or   LORazepam (ATIVAN) injection 1-4 mg  1-4 mg Intravenous Q1H PRN Adefeso, Oladapo, DO   2 mg at 08/28/21 0442   multivitamin with minerals tablet 1 tablet  1 tablet Oral Daily Adefeso, Oladapo, DO   1 tablet at 08/28/21 0942   ondansetron (ZOFRAN) injection 4 mg  4 mg Intravenous Q6H PRN Tat, Shanon Brow, MD   4 mg at 08/27/21 2013   pantoprazole (PROTONIX) injection 40 mg  40 mg Intravenous Therisa Doyne, MD   40 mg at 08/28/21 0944   thiamine tablet 100 mg  100 mg Oral Daily Adefeso, Oladapo, DO   100 mg at 08/28/21 8341   Or   thiamine (B-1) injection 100 mg  100 mg Intravenous Daily Adefeso, Oladapo, DO        Allergies as of 08/26/2021 - Review Complete 08/26/2021  Allergen Reaction Noted   Librium [chlordiazepoxide]  10/08/2020   Toradol [ketorolac tromethamine] Shortness Of Breath 09/01/2012   Butalbital-apap-caffeine Other (See Comments) 10/04/2011   Demerol Swelling 10/04/2011   Esgic [butalbital-apap-caffeine] Other (See Comments) 10/04/2011   Iodine Other (See Comments) 10/04/2011   Pyridium [phenazopyridine] Nausea Only 07/15/2021    Family History  Problem Relation Age of Onset   Heart disease Mother    Hypertension Mother    Cancer Sister        sinus   Diabetes Brother    Heart disease Brother    Thyroid disease Brother    Cancer Sister        abdominal ?   Thyroid disease Sister    Cancer Other        GE junction adenocarcinoma   Emphysema Father    Stroke Father  Hypertension Father    Heart disease Father    COPD Sister    Thyroid disease Sister    Thyroid disease Sister    Diabetes Brother    Thyroid disease Brother    Thyroid disease Brother    Hypertension Brother    Post-traumatic stress disorder Brother    COPD Brother    Thyroid disease Brother    Thyroid disease Brother     Hypertension Brother    Transient ischemic attack Brother     Social History   Socioeconomic History   Marital status: Widowed    Spouse name: Not on file   Number of children: 5   Years of education: 8   Highest education level: 8th grade  Occupational History   Occupation: Retired  Tobacco Use   Smoking status: Former    Packs/day: 1.50    Years: 20.00    Pack years: 30.00    Types: Cigarettes    Quit date: 10/04/1998    Years since quitting: 22.9   Smokeless tobacco: Never  Vaping Use   Vaping Use: Never used  Substance and Sexual Activity   Alcohol use: Yes    Comment: 1 bottle of vodka in 2 days   Drug use: No   Sexual activity: Yes  Other Topics Concern   Not on file  Social History Narrative   Pt lives alone. States 2 children live near by.   Social Determinants of Health   Financial Resource Strain: Low Risk    Difficulty of Paying Living Expenses: Not very hard  Food Insecurity: No Food Insecurity   Worried About Charity fundraiser in the Last Year: Never true   Ran Out of Food in the Last Year: Never true  Transportation Needs: No Transportation Needs   Lack of Transportation (Medical): No   Lack of Transportation (Non-Medical): No  Physical Activity: Inactive   Days of Exercise per Week: 0 days   Minutes of Exercise per Session: 0 min  Stress: Stress Concern Present   Feeling of Stress : Rather much  Social Connections: Socially Isolated   Frequency of Communication with Friends and Family: Three times a week   Frequency of Social Gatherings with Friends and Family: Three times a week   Attends Religious Services: Never   Active Member of Clubs or Organizations: No   Attends Archivist Meetings: Never   Marital Status: Widowed  Human resources officer Violence: Not At Risk   Fear of Current or Ex-Partner: No   Emotionally Abused: No   Physically Abused: No   Sexually Abused: No    Review of Systems: See HPI, otherwise normal  ROS  Physical Exam: Temp:  [97.8 F (36.6 C)-99 F (37.2 C)] 98.7 F (37.1 C) (02/26 1125) Pulse Rate:  [82-104] 97 (02/26 1200) Resp:  [11-24] 11 (02/26 1200) BP: (120-154)/(60-114) 154/80 (02/26 1200) SpO2:  [90 %-98 %] 97 % (02/26 1200) Last BM Date : 08/28/21  Patient is alert and in no acute distress. She is not tremulous. Conjunctiva is pink.  Sclera is nonicteric. Oropharyngeal mucosa is normal. Neck without masses thyromegaly or lymphadenopathy. Cardiac exam with regular rhythm normal S1 and S2.  No murmur or gallop noted. Auscultation lungs reveal vesicular breath sounds bilaterally. Abdomen is symmetrical.  She has low midline scar.  She also has a vertical scar in right lower quadrant where she had colostomy or ileostomy.  Bowel sounds are normal.  On palpation abdomen is soft and nontender with organomegaly  or masses. No peripheral edema or koilonychia noted.  Intake/Output from previous day: 02/25 0701 - 02/26 0700 In: 1268.7 [I.V.:1268.7] Out: 300 [Urine:300] Intake/Output this shift: No intake/output data recorded.  Lab Results: Recent Labs    08/27/21 0120 08/28/21 0040  WBC 14.5* 6.8  HGB 11.2* 9.1*  HCT 34.6* 29.2*  PLT 194 151   BMET Recent Labs    08/27/21 0120 08/28/21 0040  NA 140 133*  K 3.5 3.9  CL 98 99  CO2 29 27  GLUCOSE 151* 106*  BUN 21 18  CREATININE 0.79 1.03*  CALCIUM 8.0* 7.4*   LFT Recent Labs    08/28/21 0040  PROT 5.2*  ALBUMIN 2.7*  AST 25  ALT 14  ALKPHOS 81  BILITOT 0.8   PT/INR No results for input(s): LABPROT, INR in the last 72 hours. Hepatitis Panel No results for input(s): HEPBSAG, HCVAB, HEPAIGM, HEPBIGM in the last 72 hours.  Studies/Results: CT ABDOMEN PELVIS W CONTRAST  Result Date: 08/27/2021 CLINICAL DATA:  Nausea and vomiting EXAM: CT ABDOMEN AND PELVIS WITH CONTRAST TECHNIQUE: Multidetector CT imaging of the abdomen and pelvis was performed using the standard protocol following bolus  administration of intravenous contrast. RADIATION DOSE REDUCTION: This exam was performed according to the departmental dose-optimization program which includes automated exposure control, adjustment of the mA and/or kV according to patient size and/or use of iterative reconstruction technique. CONTRAST:  134mL OMNIPAQUE IOHEXOL 300 MG/ML  SOLN COMPARISON:  06/23/2021 FINDINGS: Lower chest: Lung bases are clear. Hepatobiliary: No focal hepatic lesion. No biliary duct dilatation. Common bile duct is normal. Pancreas: Pancreas is normal. No ductal dilatation. No pancreatic inflammation. Spleen: Normal spleen Adrenals/urinary tract: Adrenal glands and kidneys are normal. The ureters and bladder normal. Stomach/Bowel: Stomach, small-bowel and cecum are normal. The appendix is not identified but there is no pericecal inflammation to suggest appendicitis. Moderate volume stool LEFT colon and rectosigmoid colon. Surgical anastomosis in the sigmoid colon. Vascular/Lymphatic: Abdominal aorta is normal caliber with atherosclerotic calcification. There is no retroperitoneal or periportal lymphadenopathy. No pelvic lymphadenopathy. Reproductive: Post hysterectomy.  Adnexa unremarkable Other: No free fluid. Musculoskeletal: No aggressive osseous lesion. IMPRESSION: 1. No acute findings in the abdomen pelvis. 2. Thickening the distal esophagus similar prior. 3. Moderate volume stool in the sigmoid colon and rectum. Surgical anastomosis in the sigmoid colon. Electronically Signed   By: Suzy Bouchard M.D.   On: 08/27/2021 19:16    Current study reviewed and compared with 1 from 06/23/2021 which revealed hepatomegaly and steatosis.  Assessment;  Nausea and vomiting resolved.  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  Distal esophageal wall thickening seen on current CT as alone on prior study of 06/23/2021.  She has a history of GERD but presently not taking any medications.  No history of dysphagia.  Suspect these changes  are due to esophagitis.  She would benefit from diagnostic esophagogastroduodenoscopy.  Anemia.  Hemoglobin has dropped post hydration.  Normal MCV.  No evidence of overt GI bleed.  Prior colonoscopies have been by Dr. Stacie Glaze physicians.  Last one in the system was performed in August 2023 prior to reversal of her colostomy.  Is unclear if she has had a colonoscopy since then.  Hepatomegaly.  Liver appears to be echogenic.  Transaminases are normal.  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  He  History of alcohol abuse.  Patient lives alone and I wonder if she is going back to drinking when she is discharged.   Recommendations;  CBC and INR in AM. Hold enoxaparin. Acetaminophen dose changed to 500 mg 4 times a day as needed. Diagnostic esophagogastroduodenoscopy in AM by Dr. Abbey Chatters.    LOS: 1 day   Cuinn Westerhold  08/28/2021, 2:31 PM

## 2021-08-28 NOTE — TOC Initial Note (Signed)
Transition of Care [facility]) - Initial/Assessment Note    Patient Details  Name: Stacy Dalton MRN: [id number] Date of Birth: Jun 21, 1951  [facility]) CM/SW Contact:    Salome Arnt, [place] Phone Number: 08/28/2021, 2:15 PM  Clinical Narrative: Pt admitted due to alcohol withdrawal and hypertensive urgency. Pt reports she lives alone and is independent with ADLs. TOC received consult for substance use. Pt admits alcohol has been a problem for her in the past, but she doesn't feel it is now. She declines resources. Pt indicates her primary goal this hospitalization is to go to SNF. She is aware she will require skilled need. MD notified and will order PT. TOC will follow up with recommendations.                   Expected Discharge Plan:  (TBD) Barriers to Discharge: Continued Medical Work up   Patient Goals and CMS Choice Patient states their goals for this hospitalization and ongoing recovery are:: unsure at this time   Choice offered to / list presented to : Patient  Expected Discharge Plan and Services Expected Discharge Plan:  (TBD) In-house Referral: Clinical Social Work     Living arrangements for the past 2 months: Single Family Home                                      Prior Living Arrangements/Services Living arrangements for the past 2 months: Single Family Home Lives with:: Self Patient language and need for interpreter reviewed:: Yes Do you feel safe going back to the place where you live?: Yes      Need for Family Participation in Patient Care: No (Comment)   Current home services: DME (walker) Criminal Activity/Legal Involvement Pertinent to Current Situation/Hospitalization: No - Comment as needed  Activities of Daily Living Home Assistive Devices/Equipment: Walker (specify type) ADL Screening (condition at time of admission) Patient's cognitive ability adequate to safely complete daily activities?: Yes Is the patient deaf or  have difficulty hearing?: No Does the patient have difficulty seeing, even when wearing glasses/contacts?: No Does the patient have difficulty concentrating, remembering, or making decisions?: No Patient able to express need for assistance with ADLs?: Yes Does the patient have difficulty dressing or bathing?: No Independently performs ADLs?: No Communication: Independent Dressing (OT): Independent with device (comment) Grooming: Independent with device (comment) Bathing: Independent Toileting: Independent with device (comment) In/Out Bed: Independent with device (comment) Walks in Home: Independent with device (comment) Does the patient have difficulty walking or climbing stairs?: Yes Weakness of Legs: Both Weakness of Arms/Hands: None  Permission Sought/Granted                  Emotional Assessment     Affect (typically observed): Appropriate Orientation: : Oriented to Place, Oriented to  Time, Oriented to Self, Oriented to Situation Alcohol / Substance Use: Alcohol Use Psych Involvement: No (comment)  Admission diagnosis:  Alcohol withdrawal ([place]) [F10.939] Alcohol withdrawal syndrome with complication [facility]) [C16.606] Patient Active Problem List   Diagnosis Date Noted   Hypertensive urgency 08/27/2021   Esophagitis 08/27/2021   Atypical chest pain 08/27/2021   Seizure ([place]) 06/06/2021   DTs (delirium tremens) ([place]) 06/06/2021   AKI (acute kidney injury) ([place]) 05/19/2021   Elevated LFTs 30/16/0109   Metabolic acidosis 32/35/5732   COVID-19 virus infection 04/15/2021    Class: Acute   Hypotensive episode 04/15/2021  Class: Acute   Physical deconditioning    Vitamin B12 deficiency 02/04/2021   Leukocytosis 11/23/2020   Alcohol use disorder, severe, dependence ([place]) 10/08/2020   Hyponatremia 09/17/2020   Alcohol withdrawal ([place]) 09/12/2020   Moderate protein malnutrition (H[facility]) 08/20/2020   Elevated SGOT ([facility]) 08/19/2020   Elevated troponin 08/19/2020    Increased serum lipase level 08/19/2020   Alcohol abuse 06/21/2020   Large bowel obstruction (HCC)    Small bowel obstruction ([place]) 07/20/2018   Hypomagnesemia 07/20/2018   Polypharmacy 02/23/2017   Hypokalemia 79/09/8331   Toxic metabolic encephalopathy 83/29/1916   Anxiety 02/22/2017   Altered mental status    Pain medication agreement signed 10/10/2016   Opioid dependence ([place]) 10/10/2016   Status post colostomy takedown 04/12/2016   Chronic constipation 12/03/2015   Peritonitis with abscess of intestine ([place]) 11/29/2015   COPD exacerbation ([place]) 09/20/2015   Osteopenia 06/10/2014   Vitamin D deficiency 06/10/2014   Hypothyroidism    Depression 11/18/2012   Insomnia 11/18/2012   Chronic pain syndrome 11/18/2012   HLD (hyperlipidemia) 09/01/2012   S/P colostomy ([place]) 04/19/2012   Normocytic anemia 10/15/2011   Hypertension 10/13/2011   Chronic back pain 10/13/2011   COPD (chronic obstructive pulmonary disease) ([place]) 10/09/2011   PCP:  [name], FNP Pharmacy:   [facility] [place], [place] [address] [place] [address] [place] [address] Phone: [phone] Fax: [phone]     Social Determinants of Health (SDOH) Interventions    Readmission Risk Interventions Readmission Risk Prevention Plan 08/28/2021 06/08/2021 04/19/2021  Transportation Screening - Complete -  Medication Review Press photographer) Complete Complete -  PCP or Specialist appointment within 3-5 days of discharge - - Complete  HRI or Home Care Consult Complete Complete -  SW Recovery Care/Counseling Consult Complete Complete -  Palliative Care Screening Not Applicable Not Applicable -  [place] (No Data) Not Applicable -  Some recent data might be hidden

## 2021-08-28 NOTE — Progress Notes (Signed)
PROGRESS NOTE  Stacy Dalton JJK:[id number] DOB: [birth year] DOA: 08/26/2021 PCP: [name], FNP  Brief History:  71 year old female with a history of alcohol abuse/dependence, DTs, COPD, hypertension, hypothyroidism, chronic pain, depression, anxiety presenting with 1 to 2-day history of nausea, vomiting, and abdominal pain.  In addition, the patient was complaining of some alcohol withdrawal symptoms including jitteriness and shaking.  Notably, the patient continues to drink about 1/5 of vodka every 2 days.  Her last drink was on the morning of admission.  The patient was recently mated to the hospital from 06/06/2021 to 06/13/2021 when she had delirium tremens and required a Precedex drip.  She was also treated for UTI and AKI during that hospitalization.  Unfortunately, she continues to drink alcohol.  She is complaining of intermittent chest pain and shortness of breath.  She is not really able to elaborate how long she has been having only stating that it has been quite a long time.  She denies any coughing or hemoptysis.  She quit smoking in 2000 after 40-pack-year history.  She denies any illicit drugs.  She states that she has chronic daily headaches and back pain for which she takes tramadol at home.  She denies any dysuria, hematuria, hematochezia, melena, hematemesis. In the ED, the patient was afebrile hemodynamically stable with oxygen saturation 92-95% on room air.  WBC 14.5, hemoglobin 11.2, platelets 194,000.  Sodium 140, potassium 3.5, serum creatinine 0.79.  Troponin 17>> 20   Assessment/Plan:   Principal Problem:   Alcohol withdrawal (HCC) Active Problems:   Hypertension   Depression   Chronic pain syndrome   Hypothyroidism   Opioid dependence (HCC)   Hypomagnesemia   Leukocytosis   Hypertensive urgency   Esophagitis   Atypical chest pain  Assessment and Plan: * Alcohol withdrawal ([place])- (present on admission) Alcohol level was < 10 Continue CIWA  protocol, thiamine, folic acid and multivitamin Continue fall precaution and neuro checks Patient counseled on alcohol withdrawal cessation   Atypical chest pain Troponin 17>>22 Personally reviewed EKG--sinus rhythm, nonspecific T wave change Echocardiogram--pending  Esophagitis This has resulted in intractable nausea and vomiting Multiple CTs showing esophagitis--likely related to alcohol  Start PPI twice daily -lipase 37 GI consulted  Hypertensive urgency Patient was initially started on Cardene drip She was taking her amlodipine at home Presented with blood pressure up to 222/97 Restart amlodipine Weaned off nicardipine drip   Leukocytosis- (present on admission) WBC 14.5 Likely stress demargination 2/25 CT abdomen and pelvis--no acute findings; chronic distal esophageal thickening  Hypomagnesemia- (present on admission) Replete  Opioid dependence ([place])- (present on admission) As above in chronic pain syndrome  Hypothyroidism- (present on admission) Continue Synthroid  Chronic pain syndrome- (present on admission) PDMP reviewed -Patient receives tramadol 50 mg #30 on 1/11 and 06/13/21 -intermittent ativan Rx, last on 12/28  Depression- (present on admission) Continue Cymbalta  Hypertension- (present on admission) Continue amlodipine per home regimen    Status is: inpatient The patient will require greater than 2 midnights secondary to severity of illness including but not limited to alcohol withdrawal                   Family Communication:   no Family at bedside   Consultants:  GI   Code Status:  FULL    DVT Prophylaxis:  SCDs     Procedures: As Listed in Progress Note Above   Antibiotics: None  Subjective: Patient complains of pain all over.  Denies f/c, headache, diarrhea.  Claims to have n/v but none documented  Objective: Vitals:   08/28/21 1000 08/28/21 1100 08/28/21 1125 08/28/21 1200  BP:    (!) 154/80   Pulse:  (!) 104 98 97  Resp: 17 12 14 11   Temp:   98.7 F (37.1 C)   TempSrc:   Oral   SpO2:  98% 98% 97%  Weight:      Height:        Intake/Output Summary (Last 24 hours) at 08/28/2021 1426 Last data filed at 08/28/2021 0600 Gross per 24 hour  Intake 1268.65 ml  Output 300 ml  Net 968.65 ml   Weight change:  Exam:  General:  Pt is alert, follows commands appropriately, not in acute distress HEENT: No icterus, No thrush, No neck mass, [place]/AT Cardiovascular: RRR, S1/S2, no rubs, no gallops Respiratory: bibasilar rales.  No wheeze Abdomen: Soft/+BS, non tender, non distended, no guarding Extremities: No edema, No lymphangitis, No petechiae, No rashes, no synovitis   Data Reviewed: I have personally reviewed following labs and imaging studies Basic Metabolic Panel: Recent Labs  Lab 08/27/21 0120 08/28/21 0040  NA 140 133*  K 3.5 3.9  CL 98 99  CO2 29 27  GLUCOSE 151* 106*  BUN 21 18  CREATININE 0.79 1.03*  CALCIUM 8.0* 7.4*  MG 1.0* 1.9  PHOS 4.3  --    Liver Function Tests: Recent Labs  Lab 08/27/21 0120 08/28/21 0040  AST 36 25  ALT 18 14  ALKPHOS 100 81  BILITOT 0.5 0.8  PROT 7.1 5.2*  ALBUMIN 3.5 2.7*   Recent Labs  Lab 08/27/21 0120  LIPASE 37   No results for input(s): AMMONIA in the last 168 hours. Coagulation Profile: No results for input(s): INR, PROTIME in the last 168 hours. CBC: Recent Labs  Lab 08/27/21 0120 08/28/21 0040  WBC 14.5* 6.8  NEUTROABS 11.8*  --   HGB 11.2* 9.1*  HCT 34.6* 29.2*  MCV 82.2 83.9  PLT 194 151   Cardiac Enzymes: No results for input(s): CKTOTAL, CKMB, CKMBINDEX, TROPONINI in the last 168 hours. BNP: Invalid input(s): POCBNP CBG: Recent Labs  Lab 08/26/21 2300  GLUCAP 194*   HbA1C: Recent Labs    08/28/21 0040  HGBA1C 5.2   Urine analysis:    Component Value Date/Time   COLORURINE STRAW (A) 08/27/2021 0039   APPEARANCEUR CLEAR 08/27/2021 0039   APPEARANCEUR Cloudy (A) 07/01/2021 1354    LABSPEC 1.012 08/27/2021 0039   PHURINE 7.0 08/27/2021 0039   GLUCOSEU 50 (A) 08/27/2021 0039   HGBUR NEGATIVE 08/27/2021 0039   BILIRUBINUR NEGATIVE 08/27/2021 0039   BILIRUBINUR Negative 07/01/2021 1354   KETONESUR NEGATIVE 08/27/2021 0039   PROTEINUR 30 (A) 08/27/2021 0039   UROBILINOGEN negative 07/01/2015 1541   UROBILINOGEN 0.2 10/07/2012 1928   NITRITE NEGATIVE 08/27/2021 0039   LEUKOCYTESUR NEGATIVE 08/27/2021 0039   Sepsis Labs: @LABRCNTIP (procalcitonin:4,lacticidven:4) ) Recent Results (from the past 240 hour(s))  Resp Panel by RT-PCR (Flu A&B, Covid) Nasopharyngeal Swab     Status: None   Collection Time: 08/27/21  4:54 AM   Specimen: Nasopharyngeal Swab; Nasopharyngeal(NP) swabs in vial transport medium  Result Value Ref Range Status   SARS Coronavirus 2 by RT PCR NEGATIVE NEGATIVE Final    Comment: (NOTE) SARS-CoV-2 target nucleic acids are NOT DETECTED.  The SARS-CoV-2 RNA is generally detectable in upper respiratory specimens during the acute phase of infection. The lowest concentration  of SARS-CoV-2 viral copies this assay can detect is 138 copies/mL. A negative result does not preclude SARS-Cov-2 infection and should not be used as the sole basis for treatment or other patient management decisions. A negative result may occur with  improper specimen collection/handling, submission of specimen other than nasopharyngeal swab, presence of viral mutation(s) within the areas targeted by this assay, and inadequate number of viral copies(<138 copies/mL). A negative result must be combined with clinical observations, patient history, and epidemiological information. The expected result is Negative.  Fact Sheet for Patients:  EntrepreneurPulse.com.au  Fact Sheet for Healthcare Providers:  IncredibleEmployment.be  This test is no t yet approved or cleared by the [place] FDA and  has been authorized for detection and/or  diagnosis of SARS-CoV-2 by FDA under an Emergency Use Authorization (EUA). This EUA will remain  in effect (meaning this test can be used) for the duration of the COVID-19 declaration under Section 564(b)(1) of the Act, 21 U.S.C.section 360bbb-3(b)(1), unless the authorization is terminated  or revoked sooner.       Influenza A by PCR NEGATIVE NEGATIVE Final   Influenza B by PCR NEGATIVE NEGATIVE Final    Comment: (NOTE) The Xpert Xpress SARS-CoV-2/FLU/RSV plus assay is intended as an aid in the diagnosis of influenza from Nasopharyngeal swab specimens and should not be used as a sole basis for treatment. Nasal washings and aspirates are unacceptable for Xpert Xpress SARS-CoV-2/FLU/RSV testing.  Fact Sheet for Patients: EntrepreneurPulse.com.au  Fact Sheet for Healthcare Providers: IncredibleEmployment.be  This test is not yet approved or cleared by the [place] FDA and has been authorized for detection and/or diagnosis of SARS-CoV-2 by FDA under an Emergency Use Authorization (EUA). This EUA will remain in effect (meaning this test can be used) for the duration of the COVID-19 declaration under Section 564(b)(1) of the Act, 21 U.S.C. section 360bbb-3(b)(1), unless the authorization is terminated or revoked.  Performed at [facility], [address], [place], [place] [address]   MRSA Next Gen by PCR, Nasal     Status: None   Collection Time: 08/27/21  6:54 AM   Specimen: Nasal Mucosa; Nasal Swab  Result Value Ref Range Status   MRSA by PCR Next Gen NOT DETECTED NOT DETECTED Final    Comment: (NOTE) The GeneXpert MRSA Assay (FDA approved for NASAL specimens only), is one component of a comprehensive MRSA colonization surveillance program. It is not intended to diagnose MRSA infection nor to guide or monitor treatment for MRSA infections. Test performance is not FDA approved in patients less than 66 years old. Performed at [facility], [address] [place], [place] [address]      Scheduled Meds:  amLODipine  5 mg Oral Daily   Chlorhexidine Gluconate Cloth  6 each Topical Q0600   DULoxetine  60 mg Oral Daily   enoxaparin (LOVENOX) injection  40 mg Subcutaneous E83T   folic acid  1 mg Oral Daily   levothyroxine  50 mcg Oral QAC breakfast   multivitamin with minerals  1 tablet Oral Daily   pantoprazole (PROTONIX) IV  40 mg Intravenous Q12H   thiamine  100 mg Oral Daily   Or   thiamine  100 mg Intravenous Daily   Continuous Infusions:  0.9 % NaCl with KCl 20 mEq / L 75 mL/hr at 08/28/21 5176    Procedures/Studies: CT Angio Chest Pulmonary Embolism (PE) W or WO Contrast  Result Date: 08/21/2021 CLINICAL DATA:  71 year old female with chest pain. EXAM: CT ANGIOGRAPHY  CHEST WITH CONTRAST TECHNIQUE: Multidetector CT imaging of the chest was performed using the standard protocol during bolus administration of intravenous contrast. Multiplanar CT image reconstructions and MIPs were obtained to evaluate the vascular anatomy. RADIATION DOSE REDUCTION: This exam was performed according to the departmental dose-optimization program which includes automated exposure control, adjustment of the mA and/or kV according to patient size and/or use of iterative reconstruction technique. CONTRAST:  35mL OMNIPAQUE IOHEXOL 350 MG/ML SOLN COMPARISON:  Portable chest 08/20/2021. Chest CT 02/28/2021. FINDINGS: Cardiovascular: Good contrast bolus timing in the pulmonary arterial tree. Mild respiratory motion at the lung bases. No focal filling defect identified in the pulmonary arteries to suggest acute pulmonary embolism. Extensive Calcified aortic atherosclerosis. 4 vessel arch configuration (normal variant). Calcified coronary artery atherosclerosis. Stable heart size, no significant cardiomegaly. No pericardial effusion. Mediastinum/Nodes: No mediastinal mass or lymphadenopathy. But there is abnormal circumferential wall thickening of the  thoracic esophagus which appears indistinct (series 5, image 288). Lungs/Pleura: Lower lung volumes and atelectatic changes to the major airways which remain patent. Linear chronic scarring in the anteromedial right upper lobe. Minor atelectasis. No pleural effusion or other abnormal pulmonary opacity. Upper Abdomen: Grossly negative visible liver, spleen, pancreas, adrenal glands, left kidney and bowel in the upper abdomen. Musculoskeletal: No acute osseous abnormality identified. Review of the MIP images confirms the above findings. IMPRESSION: 1. Negative for acute pulmonary embolus. 2. Evidence of Acute Esophagitis, with abnormal circumferential mural thickening most pronounced in the distal esophagus. 3. No other acute finding in the chest. 4. Calcified coronary artery and Aortic Atherosclerosis (ICD10-I70.0). Electronically Signed   By: Genevie Ann M.D.   On: 08/21/2021 05:15   CT ABDOMEN PELVIS W CONTRAST  Result Date: 08/27/2021 CLINICAL DATA:  Nausea and vomiting EXAM: CT ABDOMEN AND PELVIS WITH CONTRAST TECHNIQUE: Multidetector CT imaging of the abdomen and pelvis was performed using the standard protocol following bolus administration of intravenous contrast. RADIATION DOSE REDUCTION: This exam was performed according to the departmental dose-optimization program which includes automated exposure control, adjustment of the mA and/or kV according to patient size and/or use of iterative reconstruction technique. CONTRAST:  129mL OMNIPAQUE IOHEXOL 300 MG/ML  SOLN COMPARISON:  06/23/2021 FINDINGS: Lower chest: Lung bases are clear. Hepatobiliary: No focal hepatic lesion. No biliary duct dilatation. Common bile duct is normal. Pancreas: Pancreas is normal. No ductal dilatation. No pancreatic inflammation. Spleen: Normal spleen Adrenals/urinary tract: Adrenal glands and kidneys are normal. The ureters and bladder normal. Stomach/Bowel: Stomach, small-bowel and cecum are normal. The appendix is not identified  but there is no pericecal inflammation to suggest appendicitis. Moderate volume stool LEFT colon and rectosigmoid colon. Surgical anastomosis in the sigmoid colon. Vascular/Lymphatic: Abdominal aorta is normal caliber with atherosclerotic calcification. There is no retroperitoneal or periportal lymphadenopathy. No pelvic lymphadenopathy. Reproductive: Post hysterectomy.  Adnexa unremarkable Other: No free fluid. Musculoskeletal: No aggressive osseous lesion. IMPRESSION: 1. No acute findings in the abdomen pelvis. 2. Thickening the distal esophagus similar prior. 3. Moderate volume stool in the sigmoid colon and rectum. Surgical anastomosis in the sigmoid colon. Electronically Signed   By: Suzy Bouchard M.D.   On: 08/27/2021 19:16   DG Chest Portable 1 View  Result Date: 08/21/2021 CLINICAL DATA:  Shortness of breath. EXAM: PORTABLE CHEST 1 VIEW COMPARISON:  Radiograph yesterday, chest CTA earlier today. FINDINGS: Stable heart size and mediastinal contours. Aortic atherosclerosis. Mild chronic interstitial coarsening without acute airspace disease. No pleural effusion, pneumothorax, or pulmonary edema. Stable osseous structures. IMPRESSION: No acute radiographic findings.  Electronically Signed   By: Keith Rake M.D.   On: 08/21/2021 20:28   DG Chest Port 1 View  Result Date: 08/20/2021 CLINICAL DATA:  Chest pain and shortness of breath. EXAM: PORTABLE CHEST 1 VIEW COMPARISON:  Radiograph 06/28/2021, chest CT 08/19/2020 FINDINGS: Stable heart size and mediastinal contours. Aortic atherosclerosis. Chronic interstitial coarsening without confluent consolidation no pulmonary edema, pleural effusion, or pneumothorax. No acute osseous abnormalities are seen. IMPRESSION: No acute chest findings. Electronically Signed   By: Keith Rake M.D.   On: 08/20/2021 23:28    Orson Eva, DO  [facility]  If 7PM-7AM, please contact night-coverage [email or web address] Password [email or web address] 08/28/2021, 2:26 PM   LOS:  1 day

## 2021-08-28 NOTE — Consult Note (Signed)
Referring Provider: No ref. provider found Primary Care Physician:  [name], FNP Primary Gastroenterologist:  Dr. Laural Golden  Reason for Consultation:    Nausea and vomiting. Thickened distal esophageal wall on CT.  HPI:   Patient is 71 year old Caucasian female who is brought to emergency room by EMS on the night of 08/26/2021.  She was having symptoms of alcohol withdrawal she also had been having nausea vomiting and diarrhea.  Her ethanol level was less than 10.  Her last ethanol level was 324 on 06/26/2021.  Her lab studies revealed WBC of 14.5 with H&H of 11.2 and 34.6.  MCV was 82.8.  Serum lipase was 37 respiratory panel was negative.  Chemistry panel was normal except glucose was 151. CT abdomen and pelvis was obtained and revealed no acute abnormalities.  There was thickening to distal esophageal wall just like on prior study of 06/23/2021.  She also had evidence of sigmoid colon surgery. Patient was admitted to ICU.  She was begun on IV fluids IV lorazepam IV pantoprazole and was given folate and thiamine. Her nausea vomiting and diarrhea resolved.  She was begun on regular diet this morning.  Repeat H&H from this morning was 9.1 and 29.2.  Patient feels much better this afternoon.  She has not experienced nausea vomiting or diarrhea today. Patient gives history of heartburn for which she was taking medicine in the past.  Lately she has not taken any.  She states she is trying to watch her diet.  No history of hematemesis melena or rectal bleeding.  She also denies hematuria.  She says she has been drinking vodka large amount for about 2 years.  She stated she started drinking in order to get relief of back pain.  She had been on tramadol before but decided to quit on her own.  She says her appetite is not good.  She has lost 10 to 15 pounds.  She states her baseline weight has been around 130 pounds. Review of the systems is also positive for severe headache.  She says she has never  been diagnosed with migraine.   Patient is widowed.  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  She is retired.  She smokes cigarettes for about 20 years 2 to 3 packs/day but quit 22 years ago.  Patient states she has been drinking vodka for about 2 years.  She says she has chronic low back pain.  She stopped tramadol and decided to get relief by drinking alcohol.  She has 5 sons.  On her son is benign brain tumor but they are all doing well. Both parents died in her 71s.  They both had stroke. She had 4 brothers and 5 sisters and only 1 sister is living.  2 sisters had unknown malignancy and one had COPD.  She says her brothers are doing well.  Past Medical History:  Diagnosis Date   Chronic pain    COPD (chronic obstructive pulmonary disease) ([place])    told has copd, no current inhaler use   Cough    Depression    ETOH abuse    GERD (gastroesophageal reflux disease)    Hypertension    Hypothyroidism    Insomnia    Migraine    Migraine    Osteopenia    Pain management    Panic attacks    Small bowel obstruction ([facility])     Past Surgical History:  Procedure Laterality Date   ABDOMINAL HYSTERECTOMY  COLON SURGERY     COLOSTOMY CLOSURE  04/19/2012   Procedure: COLOSTOMY CLOSURE;  Surgeon: Adin Hector, MD;  Location: [facility];  Service: General;  Laterality: N/A;  Laparotomy, Resection and Closure of Colostomy   COLOSTOMY TAKEDOWN N/A 04/12/2016   Procedure: [place] TAKEDOWN;  Surgeon: Johnathan Hausen, MD;  Location: [facility];  Service: General;  Laterality: N/A;   INCONTINENCE SURGERY     LAPAROTOMY  10/04/2011, colostomy also   Procedure: EXPLORATORY LAPAROTOMY;  Surgeon: Adin Hector, MD;  Location: [facility];  Service: General;  Laterality: N/A;  left partial colectomy with colostomy   LAPAROTOMY  04/19/2012   Procedure: EXPLORATORY LAPAROTOMY;  Surgeon: Adin Hector, MD;  Location: [facility];  Service: General;  Laterality: N/A;   LAPAROTOMY N/A  11/29/2015   Procedure: EXPLORATORY LAPAROTOMY; SUBTOTAL COLECTOMY WITH HARTMAN PROCEDURE AND END COLOSTOMY;  Surgeon: Johnathan Hausen, MD;  Location: [facility];  Service: General;  Laterality: N/A;   TUBAL LIGATION     VENTRAL HERNIA REPAIR  04/19/2012   Procedure: HERNIA REPAIR VENTRAL ADULT;  Surgeon: Adin Hector, MD;  Location: [facility];  Service: General;  Laterality: N/A;    Prior to Admission medications   Medication Sig Start Date End Date Taking? Authorizing Provider  albuterol (VENTOLIN HFA) 108 (90 Base) MCG/ACT inhaler Inhale 2 puffs into the lungs every 4 (four) hours as needed for wheezing or shortness of breath. 08/21/21   Orpah Greek, MD  amLODipine (NORVASC) 5 MG tablet Take 1 tablet (5 mg total) by mouth daily. 05/22/21   Regalado, Belkys A, MD  amoxicillin (AMOXIL) 500 MG capsule Take 1 capsule (500 mg total) by mouth 3 (three) times daily. 08/21/21   Milton Ferguson, MD  doxycycline (VIBRAMYCIN) 100 MG capsule Take 1 capsule (100 mg total) by mouth 2 (two) times daily. 08/21/21   Orpah Greek, MD  DULoxetine (CYMBALTA) 60 MG capsule Take 1 capsule (60 mg total) by mouth daily. 08/08/21   Evelina Dun A, FNP  fluticasone-salmeterol (ADVAIR DISKUS) 250-50 MCG/ACT AEPB Inhale 1 puff into the lungs in the morning and at bedtime. Patient not taking: Reported on 05/25/2021 04/19/21 05/20/21  Deatra James, MD  folic acid (FOLVITE) 1 MG tablet Take 1 tablet (1 mg total) by mouth daily. 05/22/21   Regalado, Belkys A, MD  ibuprofen (ADVIL) 800 MG tablet Take 1 tablet (800 mg total) by mouth every 8 (eight) hours as needed. 08/21/21   Milton Ferguson, MD  levothyroxine (SYNTHROID) 50 MCG tablet Take 1 tablet (50 mcg total) by mouth daily before breakfast. 06/22/20   [name], FNP  Multiple Vitamin (MULTIVITAMIN WITH MINERALS) TABS tablet Take 1 tablet by mouth daily. 05/22/21   Regalado, Belkys A, MD  ondansetron (ZOFRAN) 4 MG tablet Take 1 tablet (4 mg total)  by mouth every 8 (eight) hours as needed for nausea or vomiting. 08/08/21   Evelina Dun A, FNP  pantoprazole (PROTONIX) 40 MG tablet Take 1 tablet (40 mg total) by mouth daily. 06/13/21 06/13/22  Barton Dubois, MD  potassium chloride SA (KLOR-CON M) 20 MEQ tablet Take 1 tablet (20 mEq total) by mouth 2 (two) times daily. 37/85/88   Delora Fuel, MD  predniSONE (DELTASONE) 20 MG tablet Take 2 tablets (40 mg total) by mouth daily with breakfast. 08/21/21   Pollina, Gwenyth Allegra, MD  thiamine 100 MG tablet Take 1 tablet (100 mg total) by mouth daily. 05/22/21   Regalado, Cassie Freer, MD  vitamin B-12 (CYANOCOBALAMIN)  1000 MCG tablet Take 1 tablet (1,000 mcg total) by mouth daily. 05/21/21   Regalado, Belkys A, MD  Vitamin D, Ergocalciferol, (DRISDOL) 1.25 MG (50000 UNIT) CAPS capsule Take 1 capsule (50,000 Units total) by mouth every 7 (seven) days. 06/22/20   [name], FNP    Current Facility-Administered Medications  Medication Dose Route Frequency Provider Last Rate Last Admin   0.9 % NaCl with KCl 20 mEq/ L  infusion   Intravenous Continuous Tat, David, MD 75 mL/hr at 08/28/21 0823 New Bag at 08/28/21 8250   acetaminophen (TYLENOL) tablet 1,000 mg  1,000 mg Oral Q6H PRN Tat, Shanon Brow, MD   1,000 mg at 08/27/21 1426   amLODipine (NORVASC) tablet 5 mg  5 mg Oral Daily Tat, David, MD   5 mg at 08/28/21 0370   Chlorhexidine Gluconate Cloth 2 % PADS 6 each  6 each Topical Q0600 Adefeso, Oladapo, DO   6 each at 08/28/21 4888   docusate (COLACE) 50 MG/5ML liquid 100 mg  100 mg Oral Daily PRN Adefeso, Oladapo, DO   100 mg at 08/27/21 2005   DULoxetine (CYMBALTA) DR capsule 60 mg  60 mg Oral Daily Tat, Shanon Brow, MD   60 mg at 08/28/21 0942   enoxaparin (LOVENOX) injection 40 mg  40 mg Subcutaneous Q24H Adefeso, Oladapo, DO   40 mg at 91/69/45 0388   folic acid (FOLVITE) tablet 1 mg  1 mg Oral Daily Adefeso, Oladapo, DO   1 mg at 08/28/21 8280   levothyroxine (SYNTHROID) tablet 50 mcg  50 mcg Oral QAC  breakfast Adefeso, Oladapo, DO   50 mcg at 08/28/21 0349   LORazepam (ATIVAN) tablet 1-4 mg  1-4 mg Oral Q1H PRN Adefeso, Oladapo, DO   2 mg at 08/28/21 1791   Or   LORazepam (ATIVAN) injection 1-4 mg  1-4 mg Intravenous Q1H PRN Adefeso, Oladapo, DO   2 mg at 08/28/21 0442   multivitamin with minerals tablet 1 tablet  1 tablet Oral Daily Adefeso, Oladapo, DO   1 tablet at 08/28/21 0942   ondansetron (ZOFRAN) injection 4 mg  4 mg Intravenous Q6H PRN Tat, Shanon Brow, MD   4 mg at 08/27/21 2013   pantoprazole (PROTONIX) injection 40 mg  40 mg Intravenous Therisa Doyne, MD   40 mg at 08/28/21 0944   thiamine tablet 100 mg  100 mg Oral Daily Adefeso, Oladapo, DO   100 mg at 08/28/21 5056   Or   thiamine (B-1) injection 100 mg  100 mg Intravenous Daily Adefeso, Oladapo, DO        Allergies as of 08/26/2021 - Review Complete 08/26/2021  Allergen Reaction Noted   Librium [chlordiazepoxide]  10/08/2020   Toradol [ketorolac tromethamine] Shortness Of Breath 09/01/2012   Butalbital-apap-caffeine Other (See Comments) 10/04/2011   Demerol Swelling 10/04/2011   Esgic [butalbital-apap-caffeine] Other (See Comments) 10/04/2011   Iodine Other (See Comments) 10/04/2011   Pyridium [phenazopyridine] Nausea Only 07/15/2021    Family History  Problem Relation Age of Onset   Heart disease Mother    Hypertension Mother    Cancer Sister        sinus   Diabetes Brother    Heart disease Brother    Thyroid disease Brother    Cancer Sister        abdominal ?   Thyroid disease Sister    Cancer Other        GE junction adenocarcinoma   Emphysema Father    Stroke Father  Hypertension Father    Heart disease Father    COPD Sister    Thyroid disease Sister    Thyroid disease Sister    Diabetes Brother    Thyroid disease Brother    Thyroid disease Brother    Hypertension Brother    Post-traumatic stress disorder Brother    COPD Brother    Thyroid disease Brother    Thyroid disease Brother     Hypertension Brother    Transient ischemic attack Brother     Social History   Socioeconomic History   Marital status: Widowed    Spouse name: Not on file   Number of children: 5   Years of education: 8   Highest education level: 8th grade  Occupational History   Occupation: Retired  Tobacco Use   Smoking status: Former    Packs/day: 1.50    Years: 20.00    Pack years: 30.00    Types: Cigarettes    Quit date: 10/04/1998    Years since quitting: 22.9   Smokeless tobacco: Never  Vaping Use   Vaping Use: Never used  Substance and Sexual Activity   Alcohol use: Yes    Comment: 1 bottle of vodka in 2 days   Drug use: No   Sexual activity: Yes  Other Topics Concern   Not on file  Social History Narrative   Pt lives alone. States 2 children live near by.   Social Determinants of Health   Financial Resource Strain: Low Risk    Difficulty of Paying Living Expenses: Not very hard  Food Insecurity: No Food Insecurity   Worried About Charity fundraiser in the Last Year: Never true   Ran Out of Food in the Last Year: Never true  Transportation Needs: No Transportation Needs   Lack of Transportation (Medical): No   Lack of Transportation (Non-Medical): No  Physical Activity: Inactive   Days of Exercise per Week: 0 days   Minutes of Exercise per Session: 0 min  Stress: Stress Concern Present   Feeling of Stress : Rather much  Social Connections: Socially Isolated   Frequency of Communication with Friends and Family: Three times a week   Frequency of Social Gatherings with Friends and Family: Three times a week   Attends Religious Services: Never   Active Member of Clubs or Organizations: No   Attends Archivist Meetings: Never   Marital Status: Widowed  Human resources officer Violence: Not At Risk   Fear of Current or Ex-Partner: No   Emotionally Abused: No   Physically Abused: No   Sexually Abused: No    Review of Systems: See HPI, otherwise normal  ROS  Physical Exam: Temp:  [97.8 F (36.6 C)-99 F (37.2 C)] 98.7 F (37.1 C) (02/26 1125) Pulse Rate:  [82-104] 97 (02/26 1200) Resp:  [11-24] 11 (02/26 1200) BP: (120-154)/(60-114) 154/80 (02/26 1200) SpO2:  [90 %-98 %] 97 % (02/26 1200) Last BM Date : 08/28/21  Patient is alert and in no acute distress. She is not tremulous. Conjunctiva is pink.  Sclera is nonicteric. Oropharyngeal mucosa is normal. Neck without masses thyromegaly or lymphadenopathy. Cardiac exam with regular rhythm normal S1 and S2.  No murmur or gallop noted. Auscultation lungs reveal vesicular breath sounds bilaterally. Abdomen is symmetrical.  She has low midline scar.  She also has a vertical scar in right lower quadrant where she had colostomy or ileostomy.  Bowel sounds are normal.  On palpation abdomen is soft and nontender with organomegaly  or masses. No peripheral edema or koilonychia noted.  Intake/Output from previous day: 02/25 0701 - 02/26 0700 In: 1268.7 [I.V.:1268.7] Out: 300 [Urine:300] Intake/Output this shift: No intake/output data recorded.  Lab Results: Recent Labs    08/27/21 0120 08/28/21 0040  WBC 14.5* 6.8  HGB 11.2* 9.1*  HCT 34.6* 29.2*  PLT 194 151   BMET Recent Labs    08/27/21 0120 08/28/21 0040  NA 140 133*  K 3.5 3.9  CL 98 99  CO2 29 27  GLUCOSE 151* 106*  BUN 21 18  CREATININE 0.79 1.03*  CALCIUM 8.0* 7.4*   LFT Recent Labs    08/28/21 0040  PROT 5.2*  ALBUMIN 2.7*  AST 25  ALT 14  ALKPHOS 81  BILITOT 0.8   PT/INR No results for input(s): LABPROT, INR in the last 72 hours. Hepatitis Panel No results for input(s): HEPBSAG, HCVAB, HEPAIGM, HEPBIGM in the last 72 hours.  Studies/Results: CT ABDOMEN PELVIS W CONTRAST  Result Date: 08/27/2021 CLINICAL DATA:  Nausea and vomiting EXAM: CT ABDOMEN AND PELVIS WITH CONTRAST TECHNIQUE: Multidetector CT imaging of the abdomen and pelvis was performed using the standard protocol following bolus  administration of intravenous contrast. RADIATION DOSE REDUCTION: This exam was performed according to the departmental dose-optimization program which includes automated exposure control, adjustment of the mA and/or kV according to patient size and/or use of iterative reconstruction technique. CONTRAST:  178mL OMNIPAQUE IOHEXOL 300 MG/ML  SOLN COMPARISON:  06/23/2021 FINDINGS: Lower chest: Lung bases are clear. Hepatobiliary: No focal hepatic lesion. No biliary duct dilatation. Common bile duct is normal. Pancreas: Pancreas is normal. No ductal dilatation. No pancreatic inflammation. Spleen: Normal spleen Adrenals/urinary tract: Adrenal glands and kidneys are normal. The ureters and bladder normal. Stomach/Bowel: Stomach, small-bowel and cecum are normal. The appendix is not identified but there is no pericecal inflammation to suggest appendicitis. Moderate volume stool LEFT colon and rectosigmoid colon. Surgical anastomosis in the sigmoid colon. Vascular/Lymphatic: Abdominal aorta is normal caliber with atherosclerotic calcification. There is no retroperitoneal or periportal lymphadenopathy. No pelvic lymphadenopathy. Reproductive: Post hysterectomy.  Adnexa unremarkable Other: No free fluid. Musculoskeletal: No aggressive osseous lesion. IMPRESSION: 1. No acute findings in the abdomen pelvis. 2. Thickening the distal esophagus similar prior. 3. Moderate volume stool in the sigmoid colon and rectum. Surgical anastomosis in the sigmoid colon. Electronically Signed   By: Suzy Bouchard M.D.   On: 08/27/2021 19:16    Current study reviewed and compared with 1 from 06/23/2021 which revealed hepatomegaly and steatosis.  Assessment;  Nausea and vomiting resolved.  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  Distal esophageal wall thickening seen on current CT as alone on prior study of 06/23/2021.  She has a history of GERD but presently not taking any medications.  No history of dysphagia.  Suspect these changes  are due to esophagitis.  She would benefit from diagnostic esophagogastroduodenoscopy.  Anemia.  Hemoglobin has dropped post hydration.  Normal MCV.  No evidence of overt GI bleed.  Prior colonoscopies have been by Dr. Stacie Glaze physicians.  Last one in the system was performed in August 2023 prior to reversal of her colostomy.  Is unclear if she has had a colonoscopy since then.  Hepatomegaly.  Liver appears to be echogenic.  Transaminases are normal.  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  He  History of alcohol abuse.  Patient lives alone and I wonder if she is going back to drinking when she is discharged.   Recommendations;  CBC and INR in AM. Hold enoxaparin. Acetaminophen dose changed to 500 mg 4 times a day as needed. Diagnostic esophagogastroduodenoscopy in AM by Dr. Abbey Chatters.    LOS: 1 day   Roopa Graver  08/28/2021, 2:31 PM

## 2021-08-29 ENCOUNTER — Inpatient Hospital Stay (HOSPITAL_COMMUNITY): Payer: Medicare Other | Admitting: Anesthesiology

## 2021-08-29 ENCOUNTER — Telehealth: Payer: Self-pay | Admitting: Gastroenterology

## 2021-08-29 ENCOUNTER — Inpatient Hospital Stay (HOSPITAL_COMMUNITY): Payer: Medicare Other

## 2021-08-29 ENCOUNTER — Encounter (HOSPITAL_COMMUNITY): Payer: Self-pay | Admitting: Internal Medicine

## 2021-08-29 ENCOUNTER — Encounter (HOSPITAL_COMMUNITY)
Admission: EM | Disposition: A | Discharge status: Home / Self Care | Payer: Self-pay | Source: Home / Self Care | Attending: Internal Medicine

## 2021-08-29 DIAGNOSIS — R079 Chest pain, unspecified: Secondary | ICD-10-CM | POA: Diagnosis not present

## 2021-08-29 DIAGNOSIS — K2970 Gastritis, unspecified, without bleeding: Secondary | ICD-10-CM

## 2021-08-29 DIAGNOSIS — K2100 Gastro-esophageal reflux disease with esophagitis, without bleeding: Secondary | ICD-10-CM

## 2021-08-29 DIAGNOSIS — R1310 Dysphagia, unspecified: Secondary | ICD-10-CM

## 2021-08-29 DIAGNOSIS — F10939 Alcohol use, unspecified with withdrawal, unspecified: Secondary | ICD-10-CM | POA: Diagnosis not present

## 2021-08-29 DIAGNOSIS — K449 Diaphragmatic hernia without obstruction or gangrene: Secondary | ICD-10-CM

## 2021-08-29 DIAGNOSIS — K222 Esophageal obstruction: Secondary | ICD-10-CM

## 2021-08-29 DIAGNOSIS — G894 Chronic pain syndrome: Secondary | ICD-10-CM | POA: Diagnosis not present

## 2021-08-29 DIAGNOSIS — R0789 Other chest pain: Secondary | ICD-10-CM | POA: Diagnosis not present

## 2021-08-29 HISTORY — PX: ESOPHAGOGASTRODUODENOSCOPY (EGD) WITH PROPOFOL: SHX5813

## 2021-08-29 HISTORY — PX: BIOPSY: SHX5522

## 2021-08-29 LAB — BASIC METABOLIC PANEL
Anion gap: 8 (ref 5–15)
BUN: 10 mg/dL (ref 8–23)
CO2: 24 mmol/L (ref 22–32)
Calcium: 8.5 mg/dL — ABNORMAL LOW (ref 8.9–10.3)
Chloride: 100 mmol/L (ref 98–111)
Creatinine, Ser: 0.71 mg/dL (ref 0.44–1.00)
GFR, Estimated: >60 mL/min (ref >60)
Glucose, Bld: 97 mg/dL (ref 70–99)
Potassium: 3.5 mmol/L (ref 3.5–5.1)
Sodium: 132 mmol/L — ABNORMAL LOW (ref 135–145)

## 2021-08-29 LAB — CBC
HCT: 32.1 % — ABNORMAL LOW (ref 36.0–46.0)
Hemoglobin: 10.3 g/dL — ABNORMAL LOW (ref 12.0–15.0)
MCH: 26.6 pg (ref 26.0–34.0)
MCHC: 32.1 g/dL (ref 30.0–36.0)
MCV: 82.9 fL (ref 80.0–100.0)
Platelets: 156 10*3/uL (ref 150–400)
RBC: 3.87 MIL/uL (ref 3.87–5.11)
RDW: 18.9 % — ABNORMAL HIGH (ref 11.5–15.5)
WBC: 7.2 10*3/uL (ref 4.0–10.5)
nRBC: 0 % (ref 0.0–0.2)

## 2021-08-29 LAB — PROTIME-INR
INR: 0.9 (ref 0.8–1.2)
Prothrombin Time: 12.5 seconds (ref 11.4–15.2)

## 2021-08-29 LAB — ECHOCARDIOGRAM COMPLETE
AR max vel: 1.92 cm2
AV Area VTI: 1.85 cm2
AV Area mean vel: 1.81 cm2
AV Mean grad: 6.7 mmHg
AV Peak grad: 11.7 mmHg
Ao pk vel: 1.71 m/s
Area-P 1/2: 3.85 cm2
Calc EF: 63.7 %
Height: 61 in
S' Lateral: 2.3 cm
Single Plane A2C EF: 64.3 %
Single Plane A4C EF: 61.4 %
Weight: 1925.94 oz

## 2021-08-29 LAB — MAGNESIUM: Magnesium: 1.7 mg/dL (ref 1.7–2.4)

## 2021-08-29 SURGERY — ESOPHAGOGASTRODUODENOSCOPY (EGD) WITH PROPOFOL
Anesthesia: General

## 2021-08-29 MED ORDER — MAGNESIUM SULFATE 2 GM/50ML IV SOLN
2.0000 g | Freq: Once | INTRAVENOUS | Status: AC
  Administered 2021-08-29: 2 g via INTRAVENOUS
  Filled 2021-08-29: qty 50

## 2021-08-29 MED ORDER — LIDOCAINE 2% (20 MG/ML) 5 ML SYRINGE
INTRAMUSCULAR | Status: DC | PRN
  Administered 2021-08-29: 50 mg via INTRAVENOUS

## 2021-08-29 MED ORDER — LACTATED RINGERS IV SOLN: INTRAVENOUS | Status: DC

## 2021-08-29 MED ORDER — SODIUM CHLORIDE 0.9 % IV SOLN: INTRAVENOUS | Status: DC

## 2021-08-29 MED ORDER — AMLODIPINE BESYLATE 5 MG PO TABS: 10.0000 mg | ORAL_TABLET | Freq: Every day | ORAL | Status: DC

## 2021-08-29 MED ORDER — CYANOCOBALAMIN 1000 MCG/ML IJ SOLN
1000.0000 ug | Freq: Once | INTRAMUSCULAR | Status: AC
  Administered 2021-08-29: 1000 ug via INTRAMUSCULAR
  Filled 2021-08-29: qty 1

## 2021-08-29 MED ORDER — AMLODIPINE BESYLATE 10 MG PO TABS: 10.0000 mg | ORAL_TABLET | Freq: Every day | ORAL | 1 refills | Status: DC

## 2021-08-29 MED ORDER — SUCRALFATE 1 GM/10ML PO SUSP
1.0000 g | Freq: Three times a day (TID) | ORAL | Status: DC
  Filled 2021-08-29: qty 10

## 2021-08-29 MED ORDER — PANTOPRAZOLE SODIUM 40 MG PO TBEC: 40.0000 mg | DELAYED_RELEASE_TABLET | Freq: Two times a day (BID) | ORAL | 1 refills | Status: DC

## 2021-08-29 MED ORDER — PROPOFOL 10 MG/ML IV BOLUS
INTRAVENOUS | Status: DC | PRN
  Administered 2021-08-29 (×2): 50 mg via INTRAVENOUS
  Administered 2021-08-29: 30 mg via INTRAVENOUS

## 2021-08-29 MED ORDER — VITAMIN B-12 100 MCG PO TABS: 500.0000 ug | ORAL_TABLET | Freq: Every day | ORAL | Status: DC

## 2021-08-29 MED ORDER — SUCRALFATE 1 GM/10ML PO SUSP
1.0000 g | Freq: Three times a day (TID) | ORAL | 1 refills | Status: DC
Start: 2021-08-29 — End: 2021-10-11

## 2021-08-29 NOTE — Anesthesia Preprocedure Evaluation (Signed)
Anesthesia Evaluation  Patient identified by MRN, date of birth, ID band Patient awake    Reviewed: Allergy & Precautions, NPO status , Patient's Chart, lab work & pertinent test results  Airway Mallampati: II  TM Distance: >3 FB Neck ROM: Full    Dental  (+) Dental Advisory Given, Edentulous Upper, Missing   Pulmonary COPD, former smoker,    Pulmonary exam normal breath sounds clear to auscultation       Cardiovascular hypertension, Pt. on medications Normal cardiovascular exam Rhythm:Regular Rate:Normal     Neuro/Psych  Headaches, Seizures -,  PSYCHIATRIC DISORDERS Anxiety Depression    GI/Hepatic Neg liver ROS, GERD  Medicated,  Endo/Other  Hypothyroidism   Renal/GU Renal disease  negative genitourinary   Musculoskeletal negative musculoskeletal ROS (+)   Abdominal   Peds negative pediatric ROS (+)  Hematology  (+) Blood dyscrasia, anemia ,   Anesthesia Other Findings   Reproductive/Obstetrics negative OB ROS                             Anesthesia Physical Anesthesia Plan  ASA: 3  Anesthesia Plan: General   Post-op Pain Management: Minimal or no pain anticipated   Induction: Intravenous  PONV Risk Score and Plan: TIVA  Airway Management Planned: Nasal Cannula and Natural Airway  Additional Equipment:   Intra-op Plan:   Post-operative Plan:   Informed Consent: I have reviewed the patients History and Physical, chart, labs and discussed the procedure including the risks, benefits and alternatives for the proposed anesthesia with the patient or authorized representative who has indicated his/her understanding and acceptance.     Dental advisory given  Plan Discussed with: CRNA and Surgeon  Anesthesia Plan Comments:         Anesthesia Quick Evaluation

## 2021-08-29 NOTE — Telephone Encounter (Signed)
HI, Stacy Dalton, needs hospital follow-up in 2-4 weeks. Thanks!

## 2021-08-29 NOTE — Progress Notes (Signed)
Nsg Discharge Note  Admit Date:  08/26/2021 Discharge date: 08/29/2021   Stacy Dalton to be D/C'd Home per MD order.  AVS completed.  Copy for chart, and copy for patient signed, and dated. Patient/caregiver able to verbalize understanding.  Discharge Medication: Allergies as of 08/29/2021       Reactions   Librium [chlordiazepoxide]    Became unresponsive   Toradol [ketorolac Tromethamine] Shortness Of Breath   Butalbital-apap-caffeine Other (See Comments)   jittery   Demerol Swelling   Esgic [butalbital-apap-caffeine] Other (See Comments)   jittery   Iodine Other (See Comments)   bp bottomed out per pt several hours later; unsure if pre medicated in past with cm; done in [place]   Pyridium [phenazopyridine] Nausea Only        Medication List     STOP taking these medications    amoxicillin 500 MG capsule Commonly known as: AMOXIL   doxycycline 100 MG capsule Commonly known as: VIBRAMYCIN   ibuprofen 800 MG tablet Commonly known as: ADVIL   potassium chloride SA 20 MEQ tablet Commonly known as: KLOR-CON M   predniSONE 20 MG tablet Commonly known as: DELTASONE       TAKE these medications    albuterol 108 (90 Base) MCG/ACT inhaler Commonly known as: VENTOLIN HFA Inhale 2 puffs into the lungs every 4 (four) hours as needed for wheezing or shortness of breath.   amLODipine 10 MG tablet Commonly known as: NORVASC Take 1 tablet (10 mg total) by mouth daily. Start taking on: August 30, 2021 What changed:  medication strength how much to take   DULoxetine 60 MG capsule Commonly known as: Cymbalta Take 1 capsule (60 mg total) by mouth daily.   fluticasone-salmeterol 250-50 MCG/ACT Aepb Commonly known as: Advair Diskus Inhale 1 puff into the lungs in the morning and at bedtime.   folic acid 1 MG tablet Commonly known as: FOLVITE Take 1 tablet (1 mg total) by mouth daily.   levothyroxine 50 MCG tablet Commonly known as: Synthroid Take 1 tablet  (50 mcg total) by mouth daily before breakfast.   multivitamin with minerals Tabs tablet Take 1 tablet by mouth daily.   ondansetron 4 MG tablet Commonly known as: Zofran Take 1 tablet (4 mg total) by mouth every 8 (eight) hours as needed for nausea or vomiting.   pantoprazole 40 MG tablet Commonly known as: Protonix Take 1 tablet (40 mg total) by mouth 2 (two) times daily. What changed: when to take this   sucralfate 1 GM/10ML suspension Commonly known as: CARAFATE Take 10 mLs (1 g total) by mouth 4 (four) times daily -  with meals and at bedtime.   thiamine 100 MG tablet Take 1 tablet (100 mg total) by mouth daily.   vitamin B-12 1000 MCG tablet Commonly known as: CYANOCOBALAMIN Take 1 tablet (1,000 mcg total) by mouth daily.   Vitamin D (Ergocalciferol) 1.25 MG (50000 UNIT) Caps capsule Commonly known as: DRISDOL Take 1 capsule (50,000 Units total) by mouth every 7 (seven) days.        Discharge Assessment: Vitals:   08/29/21 1213 08/29/21 1429  BP: (!) 157/90 (!) 147/78  Pulse: 81 98  Resp: 18 18  Temp: 97.9 F (36.6 C) 98.3 F (36.8 C)  SpO2: 97% 100%   Skin clean, dry and intact without evidence of skin break down, no evidence of skin tears noted. IV catheter discontinued intact. Site without signs and symptoms of complications - no redness or edema noted  at insertion site, patient denies c/o pain - only slight tenderness at site.  Dressing with slight pressure applied.  D/c Instructions-Education: Discharge instructions given to patient/family with verbalized understanding. D/c education completed with patient/family including follow up instructions, medication list, d/c activities limitations if indicated, with other d/c instructions as indicated by MD - patient able to verbalize understanding, all questions fully answered. Patient instructed to return to ED, call 911, or call MD for any changes in condition.  Patient escorted via [place], and D/C home via private  auto.  Dorcas Mcmurray, LPN [id number]/73/5789 7:84 PM

## 2021-08-29 NOTE — Progress Notes (Signed)
°  Echocardiogram 2D Echocardiogram has been performed.  Stacy Dalton 08/29/2021, 10:21 AM

## 2021-08-29 NOTE — Progress Notes (Addendum)
PROGRESS NOTE  Stacy Dalton ZOX:[id number] DOB: [birth year] DOA: 08/26/2021 PCP: [name], FNP  Brief History:  71 year old female with a history of alcohol abuse/dependence, DTs, COPD, hypertension, hypothyroidism, chronic pain, depression, anxiety presenting with 1 to 2-day history of nausea, vomiting, and abdominal pain.  In addition, the patient was complaining of some alcohol withdrawal symptoms including jitteriness and shaking.  Notably, the patient continues to drink about 1/5 of vodka every 2 days.  Her last drink was on the morning of admission.  The patient was recently mated to the hospital from 06/06/2021 to 06/13/2021 when she had delirium tremens and required a Precedex drip.  She was also treated for UTI and AKI during that hospitalization.  Unfortunately, she continues to drink alcohol.  She is complaining of intermittent chest pain and shortness of breath.  She is not really able to elaborate how long she has been having only stating that it has been quite a long time.  She denies any coughing or hemoptysis.  She quit smoking in 2000 after 40-pack-year history.  She denies any illicit drugs.  She states that she has chronic daily headaches and back pain for which she takes tramadol at home.  She denies any dysuria, hematuria, hematochezia, melena, hematemesis. In the ED, the patient was afebrile hemodynamically stable with oxygen saturation 92-95% on room air.  WBC 14.5, hemoglobin 11.2, platelets 194,000.  Sodium 140, potassium 3.5, serum creatinine 0.79.  Troponin 17>> 20     Assessment and Plan: * Alcohol withdrawal ([place])- (present on admission) Alcohol level was < 10 Continue CIWA protocol, thiamine, folic acid and multivitamin Continue fall precaution and neuro checks Patient counseled on alcohol withdrawal cessation No signs of withdrawal during the hospitalization--pt requesting ativan for baseline anxiety  Esophagitis This has resulted in intractable  nausea and vomiting Multiple CTs showing esophagitis--likely related to alcohol  Continue PPI twice daily -lipase 37 GI consult appreciated>>planning EGD 2/27 -Hgb has remained stable throughout hospitaliztion Tolerating diet without N/V  Atypical chest pain Troponin 17>>22 Personally reviewed EKG--sinus rhythm, nonspecific T wave change Echocardiogram--pending Likely due to GI etiology/esophagitis  Hypertensive urgency Patient was initially started on Cardene drip She was taking her amlodipine at home Presented with blood pressure up to 222/97 Restart amlodipine>>increase dose Weaned off nicardipine drip   Leukocytosis- (present on admission) WBC 14.5 Due to stress demargination 2/25 CT abdomen and pelvis--no acute findings; chronic distal esophageal thickening Improved without antibiotics  Hypomagnesemia- (present on admission) Replete  Opioid dependence ([place])- (present on admission) As above in chronic pain syndrome  Hypothyroidism- (present on admission) Continue Synthroid  Chronic pain syndrome- (present on admission) PDMP reviewed -Patient receives tramadol 50 mg #30 on 1/11 and 06/13/21 -intermittent ativan Rx, last on 12/28  Depression- (present on admission) Continue Cymbalta  Hypertension- (present on admission) Continue amlodipine per home regimen Increase amlodipine to 10 mg daily  Status is: inpatient The patient will require greater than 2 midnights secondary to severity of illness including but not limited to alcohol withdrawal                   Family Communication:   no Family at bedside   Consultants:  GI   Code Status:  FULL    DVT Prophylaxis:  SCDs     Procedures: As Listed in Progress Note Above   Antibiotics: None           Subjective: Patient tolerating diet.  Has not had any n/v/d.  Had small BM.  Objective: Vitals:   08/29/21 0412 08/29/21 0500 08/29/21 0600 08/29/21 0700  BP:  (!) 170/82 (!) 164/90    Pulse:   76   Resp:   15   Temp:    98.7 F (37.1 C)  TempSrc:    Oral  SpO2:   95%   Weight: 54.6 kg     Height:        Intake/Output Summary (Last 24 hours) at 08/29/2021 0749 Last data filed at 08/29/2021 0500 Gross per 24 hour  Intake 1106.08 ml  Output 1650 ml  Net -543.92 ml   Weight change:  Exam:  General:  Pt is alert, follows commands appropriately, not in acute distress HEENT: No icterus, No thrush, No neck mass, [place]/AT Cardiovascular: RRR, S1/S2, no rubs, no gallops Respiratory: fine bibasilar crackles. No wheeze Abdomen: Soft/+BS, non tender, non distended, no guarding Extremities: No edema, No lymphangitis, No petechiae, No rashes, no synovitis   Data Reviewed: I have personally reviewed following labs and imaging studies Basic Metabolic Panel: Recent Labs  Lab 08/27/21 0120 08/28/21 0040 08/29/21 0442  NA 140 133* 132*  K 3.5 3.9 3.5  CL 98 99 100  CO2 29 27 24   GLUCOSE 151* 106* 97  BUN 21 18 10   CREATININE 0.79 1.03* 0.71  CALCIUM 8.0* 7.4* 8.5*  MG 1.0* 1.9 1.7  PHOS 4.3  --   --    Liver Function Tests: Recent Labs  Lab 08/27/21 0120 08/28/21 0040  AST 36 25  ALT 18 14  ALKPHOS 100 81  BILITOT 0.5 0.8  PROT 7.1 5.2*  ALBUMIN 3.5 2.7*   Recent Labs  Lab 08/27/21 0120  LIPASE 37   No results for input(s): AMMONIA in the last 168 hours. Coagulation Profile: Recent Labs  Lab 08/29/21 0640  INR 0.9   CBC: Recent Labs  Lab 08/27/21 0120 08/28/21 0040 08/29/21 0442  WBC 14.5* 6.8 7.2  NEUTROABS 11.8*  --   --   HGB 11.2* 9.1* 10.3*  HCT 34.6* 29.2* 32.1*  MCV 82.2 83.9 82.9  PLT 194 151 156   Cardiac Enzymes: No results for input(s): CKTOTAL, CKMB, CKMBINDEX, TROPONINI in the last 168 hours. BNP: Invalid input(s): POCBNP CBG: Recent Labs  Lab 08/26/21 2300  GLUCAP 194*   HbA1C: Recent Labs    08/28/21 0040  HGBA1C 5.2   Urine analysis:    Component Value Date/Time   COLORURINE STRAW (A) 08/27/2021 0039    APPEARANCEUR CLEAR 08/27/2021 0039   APPEARANCEUR Cloudy (A) 07/01/2021 1354   LABSPEC 1.012 08/27/2021 0039   PHURINE 7.0 08/27/2021 0039   GLUCOSEU 50 (A) 08/27/2021 0039   HGBUR NEGATIVE 08/27/2021 0039   BILIRUBINUR NEGATIVE 08/27/2021 0039   BILIRUBINUR Negative 07/01/2021 1354   KETONESUR NEGATIVE 08/27/2021 0039   PROTEINUR 30 (A) 08/27/2021 0039   UROBILINOGEN negative 07/01/2015 1541   UROBILINOGEN 0.2 10/07/2012 1928   NITRITE NEGATIVE 08/27/2021 0039   LEUKOCYTESUR NEGATIVE 08/27/2021 0039   Sepsis Labs: @LABRCNTIP (procalcitonin:4,lacticidven:4) ) Recent Results (from the past 240 hour(s))  Resp Panel by RT-PCR (Flu A&B, Covid) Nasopharyngeal Swab     Status: None   Collection Time: 08/27/21  4:54 AM   Specimen: Nasopharyngeal Swab; Nasopharyngeal(NP) swabs in vial transport medium  Result Value Ref Range Status   SARS Coronavirus 2 by RT PCR NEGATIVE NEGATIVE Final    Comment: (NOTE) SARS-CoV-2 target nucleic acids are NOT DETECTED.  The SARS-CoV-2 RNA is generally  detectable in upper respiratory specimens during the acute phase of infection. The lowest concentration of SARS-CoV-2 viral copies this assay can detect is 138 copies/mL. A negative result does not preclude SARS-Cov-2 infection and should not be used as the sole basis for treatment or other patient management decisions. A negative result may occur with  improper specimen collection/handling, submission of specimen other than nasopharyngeal swab, presence of viral mutation(s) within the areas targeted by this assay, and inadequate number of viral copies(<138 copies/mL). A negative result must be combined with clinical observations, patient history, and epidemiological information. The expected result is Negative.  Fact Sheet for Patients:  EntrepreneurPulse.com.au  Fact Sheet for Healthcare Providers:  IncredibleEmployment.be  This test is no t yet approved or  cleared by the [place] FDA and  has been authorized for detection and/or diagnosis of SARS-CoV-2 by FDA under an Emergency Use Authorization (EUA). This EUA will remain  in effect (meaning this test can be used) for the duration of the COVID-19 declaration under Section 564(b)(1) of the Act, 21 U.S.C.section 360bbb-3(b)(1), unless the authorization is terminated  or revoked sooner.       Influenza A by PCR NEGATIVE NEGATIVE Final   Influenza B by PCR NEGATIVE NEGATIVE Final    Comment: (NOTE) The Xpert Xpress SARS-CoV-2/FLU/RSV plus assay is intended as an aid in the diagnosis of influenza from Nasopharyngeal swab specimens and should not be used as a sole basis for treatment. Nasal washings and aspirates are unacceptable for Xpert Xpress SARS-CoV-2/FLU/RSV testing.  Fact Sheet for Patients: EntrepreneurPulse.com.au  Fact Sheet for Healthcare Providers: IncredibleEmployment.be  This test is not yet approved or cleared by the [place] FDA and has been authorized for detection and/or diagnosis of SARS-CoV-2 by FDA under an Emergency Use Authorization (EUA). This EUA will remain in effect (meaning this test can be used) for the duration of the COVID-19 declaration under Section 564(b)(1) of the Act, 21 U.S.C. section 360bbb-3(b)(1), unless the authorization is terminated or revoked.  Performed at [facility], [address], [place], [place] [address]   MRSA Next Gen by PCR, Nasal     Status: None   Collection Time: 08/27/21  6:54 AM   Specimen: Nasal Mucosa; Nasal Swab  Result Value Ref Range Status   MRSA by PCR Next Gen NOT DETECTED NOT DETECTED Final    Comment: (NOTE) The GeneXpert MRSA Assay (FDA approved for NASAL specimens only), is one component of a comprehensive MRSA colonization surveillance program. It is not intended to diagnose MRSA infection nor to guide or monitor treatment for MRSA infections. Test performance  is not FDA approved in patients less than 33 years old. Performed at [facility], [address] [place], [place] [address]      Scheduled Meds:  amLODipine  5 mg Oral Daily   Chlorhexidine Gluconate Cloth  6 each Topical Q0600   DULoxetine  60 mg Oral Daily   folic acid  1 mg Oral Daily   levothyroxine  50 mcg Oral QAC breakfast   multivitamin with minerals  1 tablet Oral Daily   pantoprazole (PROTONIX) IV  40 mg Intravenous Q12H   thiamine  100 mg Oral Daily   Or   thiamine  100 mg Intravenous Daily   Continuous Infusions:  magnesium sulfate bolus IVPB      Procedures/Studies: CT Angio Chest Pulmonary Embolism (PE) W or WO Contrast  Result Date: 08/21/2021 CLINICAL DATA:  71 year old female with chest pain. EXAM: CT ANGIOGRAPHY CHEST WITH CONTRAST TECHNIQUE:  Multidetector CT imaging of the chest was performed using the standard protocol during bolus administration of intravenous contrast. Multiplanar CT image reconstructions and MIPs were obtained to evaluate the vascular anatomy. RADIATION DOSE REDUCTION: This exam was performed according to the departmental dose-optimization program which includes automated exposure control, adjustment of the mA and/or kV according to patient size and/or use of iterative reconstruction technique. CONTRAST:  92mL OMNIPAQUE IOHEXOL 350 MG/ML SOLN COMPARISON:  Portable chest 08/20/2021. Chest CT 02/28/2021. FINDINGS: Cardiovascular: Good contrast bolus timing in the pulmonary arterial tree. Mild respiratory motion at the lung bases. No focal filling defect identified in the pulmonary arteries to suggest acute pulmonary embolism. Extensive Calcified aortic atherosclerosis. 4 vessel arch configuration (normal variant). Calcified coronary artery atherosclerosis. Stable heart size, no significant cardiomegaly. No pericardial effusion. Mediastinum/Nodes: No mediastinal mass or lymphadenopathy. But there is abnormal circumferential wall thickening of the thoracic  esophagus which appears indistinct (series 5, image 288). Lungs/Pleura: Lower lung volumes and atelectatic changes to the major airways which remain patent. Linear chronic scarring in the anteromedial right upper lobe. Minor atelectasis. No pleural effusion or other abnormal pulmonary opacity. Upper Abdomen: Grossly negative visible liver, spleen, pancreas, adrenal glands, left kidney and bowel in the upper abdomen. Musculoskeletal: No acute osseous abnormality identified. Review of the MIP images confirms the above findings. IMPRESSION: 1. Negative for acute pulmonary embolus. 2. Evidence of Acute Esophagitis, with abnormal circumferential mural thickening most pronounced in the distal esophagus. 3. No other acute finding in the chest. 4. Calcified coronary artery and Aortic Atherosclerosis (ICD10-I70.0). Electronically Signed   By: Genevie Ann M.D.   On: 08/21/2021 05:15   CT ABDOMEN PELVIS W CONTRAST  Result Date: 08/27/2021 CLINICAL DATA:  Nausea and vomiting EXAM: CT ABDOMEN AND PELVIS WITH CONTRAST TECHNIQUE: Multidetector CT imaging of the abdomen and pelvis was performed using the standard protocol following bolus administration of intravenous contrast. RADIATION DOSE REDUCTION: This exam was performed according to the departmental dose-optimization program which includes automated exposure control, adjustment of the mA and/or kV according to patient size and/or use of iterative reconstruction technique. CONTRAST:  133mL OMNIPAQUE IOHEXOL 300 MG/ML  SOLN COMPARISON:  06/23/2021 FINDINGS: Lower chest: Lung bases are clear. Hepatobiliary: No focal hepatic lesion. No biliary duct dilatation. Common bile duct is normal. Pancreas: Pancreas is normal. No ductal dilatation. No pancreatic inflammation. Spleen: Normal spleen Adrenals/urinary tract: Adrenal glands and kidneys are normal. The ureters and bladder normal. Stomach/Bowel: Stomach, small-bowel and cecum are normal. The appendix is not identified but there  is no pericecal inflammation to suggest appendicitis. Moderate volume stool LEFT colon and rectosigmoid colon. Surgical anastomosis in the sigmoid colon. Vascular/Lymphatic: Abdominal aorta is normal caliber with atherosclerotic calcification. There is no retroperitoneal or periportal lymphadenopathy. No pelvic lymphadenopathy. Reproductive: Post hysterectomy.  Adnexa unremarkable Other: No free fluid. Musculoskeletal: No aggressive osseous lesion. IMPRESSION: 1. No acute findings in the abdomen pelvis. 2. Thickening the distal esophagus similar prior. 3. Moderate volume stool in the sigmoid colon and rectum. Surgical anastomosis in the sigmoid colon. Electronically Signed   By: Suzy Bouchard M.D.   On: 08/27/2021 19:16   DG Chest Portable 1 View  Result Date: 08/21/2021 CLINICAL DATA:  Shortness of breath. EXAM: PORTABLE CHEST 1 VIEW COMPARISON:  Radiograph yesterday, chest CTA earlier today. FINDINGS: Stable heart size and mediastinal contours. Aortic atherosclerosis. Mild chronic interstitial coarsening without acute airspace disease. No pleural effusion, pneumothorax, or pulmonary edema. Stable osseous structures. IMPRESSION: No acute radiographic findings. Electronically Signed  By: Keith Rake M.D.   On: 08/21/2021 20:28   DG Chest Port 1 View  Result Date: 08/20/2021 CLINICAL DATA:  Chest pain and shortness of breath. EXAM: PORTABLE CHEST 1 VIEW COMPARISON:  Radiograph 06/28/2021, chest CT 08/19/2020 FINDINGS: Stable heart size and mediastinal contours. Aortic atherosclerosis. Chronic interstitial coarsening without confluent consolidation no pulmonary edema, pleural effusion, or pneumothorax. No acute osseous abnormalities are seen. IMPRESSION: No acute chest findings. Electronically Signed   By: Keith Rake M.D.   On: 08/20/2021 23:28    Orson Eva, DO  [facility]  If 7PM-7AM, please contact night-coverage [email or web address] Password [facility] 08/29/2021, 7:49 AM   LOS: 2 days

## 2021-08-29 NOTE — Discharge Summary (Signed)
Physician Discharge Summary   Patient: NYSSA SAYEGH MRN: [id number] DOB: 09-Mar-1951  Admit date:     08/26/2021  Discharge date: 08/29/21  Discharge Physician: Shanon Brow Tylor Gambrill   PCP: [name], FNP   Recommendations at discharge:   Please follow up with primary care provider within 1-2 weeks  Please repeat BMP and CBC in one week  Please follow up on/with     Hospital Course: 71 year old female with a history of alcohol abuse/dependence, DTs, COPD, hypertension, hypothyroidism, chronic pain, depression, anxiety presenting with 1 to 2-day history of nausea, vomiting, and abdominal pain.  In addition, the patient was complaining of some alcohol withdrawal symptoms including jitteriness and shaking.  Notably, the patient continues to drink about 1/5 of vodka every 2 days.  Her last drink was on the morning of admission.  The patient was recently mated to the hospital from 06/06/2021 to 06/13/2021 when she had delirium tremens and required a Precedex drip.  She was also treated for UTI and AKI during that hospitalization.  Unfortunately, she continues to drink alcohol.  She is complaining of intermittent chest pain and shortness of breath.  She is not really able to elaborate how long she has been having only stating that it has been quite a long time.  She denies any coughing or hemoptysis.  She quit smoking in 2000 after 40-pack-year history.  She denies any illicit drugs.  She states that she has chronic daily headaches and back pain for which she takes tramadol at home.  She denies any dysuria, hematuria, hematochezia, melena, hematemesis. In the ED, the patient was afebrile hemodynamically stable with oxygen saturation 92-95% on room air.  WBC 14.5, hemoglobin 11.2, platelets 194,000.  Sodium 140, potassium 3.5, serum creatinine 0.79.  Troponin 17>> 20.  Echo was reassuring with EF 60-65%, no WMA.  GI was consulted for her esophagitis and EGD was performed which confirmed grade C reflux  esophagitis.  She was started on sulcrafate in addition to bid pantoprazole.  Post-EGD she tolerated her diet without difficulty and was d/ced with GI follow up on 09/20/21.  Assessment and Plan: * Alcohol withdrawal ([place])- (present on admission) Alcohol level was < 10 Continue CIWA protocol, thiamine, folic acid and multivitamin Continue fall precaution and neuro checks Patient counseled on alcohol withdrawal cessation No signs of withdrawal during the hospitalization--pt requesting ativan for baseline anxiety  Esophagitis This has resulted in intractable nausea and vomiting Multiple CTs showing esophagitis--likely related to alcohol  -lipase 37 GI consult appreciated>>planning EGD 2/27 -Hgb has remained stable throughout hospitaliztion 2/27 EGD--Grade C reflux Esophagitis (no bleeding), Schatzki ring, gastritis Increase protonix to bid, add carafate ac/hs Repeat EGD in 8 weeks with biopsies Tolerating diet without N/V  Atypical chest pain Troponin 17>>22 Personally reviewed EKG--sinus rhythm, nonspecific T wave change 2/27 Echo--EF 60-65%, no WMA, G1DD Likely due to GI etiology/esophagitis  Hypertensive urgency Patient was initially started on Cardene drip She was taking her amlodipine at home Presented with blood pressure up to 222/97 Restart amlodipine>>increase dose Weaned off nicardipine drip   Leukocytosis- (present on admission) WBC 14.5 Due to stress demargination 2/25 CT abdomen and pelvis--no acute findings; chronic distal esophageal thickening Improved without antibiotics  Hypomagnesemia- (present on admission) Replete  Opioid dependence ([place])- (present on admission) As above in chronic pain syndrome  Hypothyroidism- (present on admission) Continue Synthroid  Chronic pain syndrome- (present on admission) PDMP reviewed -Patient receives tramadol 50 mg #30 on 1/11 and 06/13/21 -intermittent ativan Rx, last on 12/28  Depression- (present on  admission) Continue Cymbalta  Hypertension- (present on admission) Continue amlodipine per home regimen Increase amlodipine to 10 mg daily         Consultants: GI Procedures performed: EGD 2/27 Disposition: Home Diet recommendation:  Cardiac diet  DISCHARGE MEDICATION: Allergies as of 08/29/2021       Reactions   Librium [chlordiazepoxide]    Became unresponsive   Toradol [ketorolac Tromethamine] Shortness Of Breath   Butalbital-apap-caffeine Other (See Comments)   jittery   Demerol Swelling   Esgic [butalbital-apap-caffeine] Other (See Comments)   jittery   Iodine Other (See Comments)   bp bottomed out per pt several hours later; unsure if pre medicated in past with cm; done [facility]   Pyridium [phenazopyridine] Nausea Only        Medication List     STOP taking these medications    amoxicillin 500 MG capsule Commonly known as: AMOXIL   doxycycline 100 MG capsule Commonly known as: VIBRAMYCIN   ibuprofen 800 MG tablet Commonly known as: ADVIL   potassium chloride SA 20 MEQ tablet Commonly known as: KLOR-CON M   predniSONE 20 MG tablet Commonly known as: DELTASONE       TAKE these medications    albuterol 108 (90 Base) MCG/ACT inhaler Commonly known as: VENTOLIN HFA Inhale 2 puffs into the lungs every 4 (four) hours as needed for wheezing or shortness of breath.   amLODipine 10 MG tablet Commonly known as: NORVASC Take 1 tablet (10 mg total) by mouth daily. Start taking on: August 30, 2021 What changed:  medication strength how much to take   DULoxetine 60 MG capsule Commonly known as: Cymbalta Take 1 capsule (60 mg total) by mouth daily.   fluticasone-salmeterol 250-50 MCG/ACT Aepb Commonly known as: Advair Diskus Inhale 1 puff into the lungs in the morning and at bedtime.   folic acid 1 MG tablet Commonly known as: FOLVITE Take 1 tablet (1 mg total) by mouth daily.   levothyroxine 50 MCG tablet Commonly known as:  Synthroid Take 1 tablet (50 mcg total) by mouth daily before breakfast.   multivitamin with minerals Tabs tablet Take 1 tablet by mouth daily.   ondansetron 4 MG tablet Commonly known as: Zofran Take 1 tablet (4 mg total) by mouth every 8 (eight) hours as needed for nausea or vomiting.   pantoprazole 40 MG tablet Commonly known as: Protonix Take 1 tablet (40 mg total) by mouth 2 (two) times daily. What changed: when to take this   sucralfate 1 GM/10ML suspension Commonly known as: CARAFATE Take 10 mLs (1 g total) by mouth 4 (four) times daily -  with meals and at bedtime.   thiamine 100 MG tablet Take 1 tablet (100 mg total) by mouth daily.   vitamin B-12 1000 MCG tablet Commonly known as: CYANOCOBALAMIN Take 1 tablet (1,000 mcg total) by mouth daily.   Vitamin D (Ergocalciferol) 1.25 MG (50000 UNIT) Caps capsule Commonly known as: DRISDOL Take 1 capsule (50,000 Units total) by mouth every 7 (seven) days.         Discharge Exam: Filed Weights   08/27/21 0700 08/29/21 0412 08/29/21 1033  Weight: 52.7 kg 54.6 kg 54.6 kg   HEENT:  [place]/AT, No thrush, no icterus CV:  RRR, no rub, no S3, no S4 Lung:  fine bibasilar crackles. No wheeze Abd:  soft/+BS, NT Ext:  No edema, no lymphangitis, no synovitis, no rash   Condition at discharge: good  The results of significant  diagnostics from this hospitalization (including imaging, microbiology, ancillary and laboratory) are listed below for reference.   Imaging Studies: CT Angio Chest Pulmonary Embolism (PE) W or WO Contrast  Result Date: 08/21/2021 CLINICAL DATA:  71 year old female with chest pain. EXAM: CT ANGIOGRAPHY CHEST WITH CONTRAST TECHNIQUE: Multidetector CT imaging of the chest was performed using the standard protocol during bolus administration of intravenous contrast. Multiplanar CT image reconstructions and MIPs were obtained to evaluate the vascular anatomy. RADIATION DOSE REDUCTION: This exam was performed  according to the departmental dose-optimization program which includes automated exposure control, adjustment of the mA and/or kV according to patient size and/or use of iterative reconstruction technique. CONTRAST:  28mL OMNIPAQUE IOHEXOL 350 MG/ML SOLN COMPARISON:  Portable chest 08/20/2021. Chest CT 02/28/2021. FINDINGS: Cardiovascular: Good contrast bolus timing in the pulmonary arterial tree. Mild respiratory motion at the lung bases. No focal filling defect identified in the pulmonary arteries to suggest acute pulmonary embolism. Extensive Calcified aortic atherosclerosis. 4 vessel arch configuration (normal variant). Calcified coronary artery atherosclerosis. Stable heart size, no significant cardiomegaly. No pericardial effusion. Mediastinum/Nodes: No mediastinal mass or lymphadenopathy. But there is abnormal circumferential wall thickening of the thoracic esophagus which appears indistinct (series 5, image 288). Lungs/Pleura: Lower lung volumes and atelectatic changes to the major airways which remain patent. Linear chronic scarring in the anteromedial right upper lobe. Minor atelectasis. No pleural effusion or other abnormal pulmonary opacity. Upper Abdomen: Grossly negative visible liver, spleen, pancreas, adrenal glands, left kidney and bowel in the upper abdomen. Musculoskeletal: No acute osseous abnormality identified. Review of the MIP images confirms the above findings. IMPRESSION: 1. Negative for acute pulmonary embolus. 2. Evidence of Acute Esophagitis, with abnormal circumferential mural thickening most pronounced in the distal esophagus. 3. No other acute finding in the chest. 4. Calcified coronary artery and Aortic Atherosclerosis (ICD10-I70.0). Electronically Signed   By: Genevie Ann M.D.   On: 08/21/2021 05:15   CT ABDOMEN PELVIS W CONTRAST  Result Date: 08/27/2021 CLINICAL DATA:  Nausea and vomiting EXAM: CT ABDOMEN AND PELVIS WITH CONTRAST TECHNIQUE: Multidetector CT imaging of the abdomen  and pelvis was performed using the standard protocol following bolus administration of intravenous contrast. RADIATION DOSE REDUCTION: This exam was performed according to the departmental dose-optimization program which includes automated exposure control, adjustment of the mA and/or kV according to patient size and/or use of iterative reconstruction technique. CONTRAST:  126mL OMNIPAQUE IOHEXOL 300 MG/ML  SOLN COMPARISON:  06/23/2021 FINDINGS: Lower chest: Lung bases are clear. Hepatobiliary: No focal hepatic lesion. No biliary duct dilatation. Common bile duct is normal. Pancreas: Pancreas is normal. No ductal dilatation. No pancreatic inflammation. Spleen: Normal spleen Adrenals/urinary tract: Adrenal glands and kidneys are normal. The ureters and bladder normal. Stomach/Bowel: Stomach, small-bowel and cecum are normal. The appendix is not identified but there is no pericecal inflammation to suggest appendicitis. Moderate volume stool LEFT colon and rectosigmoid colon. Surgical anastomosis in the sigmoid colon. Vascular/Lymphatic: Abdominal aorta is normal caliber with atherosclerotic calcification. There is no retroperitoneal or periportal lymphadenopathy. No pelvic lymphadenopathy. Reproductive: Post hysterectomy.  Adnexa unremarkable Other: No free fluid. Musculoskeletal: No aggressive osseous lesion. IMPRESSION: 1. No acute findings in the abdomen pelvis. 2. Thickening the distal esophagus similar prior. 3. Moderate volume stool in the sigmoid colon and rectum. Surgical anastomosis in the sigmoid colon. Electronically Signed   By: Suzy Bouchard M.D.   On: 08/27/2021 19:16   DG Chest Portable 1 View  Result Date: 08/21/2021 CLINICAL DATA:  Shortness of breath. EXAM:  PORTABLE CHEST 1 VIEW COMPARISON:  Radiograph yesterday, chest CTA earlier today. FINDINGS: Stable heart size and mediastinal contours. Aortic atherosclerosis. Mild chronic interstitial coarsening without acute airspace disease. No pleural  effusion, pneumothorax, or pulmonary edema. Stable osseous structures. IMPRESSION: No acute radiographic findings. Electronically Signed   By: Keith Rake M.D.   On: 08/21/2021 20:28   DG Chest Port 1 View  Result Date: 08/20/2021 CLINICAL DATA:  Chest pain and shortness of breath. EXAM: PORTABLE CHEST 1 VIEW COMPARISON:  Radiograph 06/28/2021, chest CT 08/19/2020 FINDINGS: Stable heart size and mediastinal contours. Aortic atherosclerosis. Chronic interstitial coarsening without confluent consolidation no pulmonary edema, pleural effusion, or pneumothorax. No acute osseous abnormalities are seen. IMPRESSION: No acute chest findings. Electronically Signed   By: Keith Rake M.D.   On: 08/20/2021 23:28   ECHOCARDIOGRAM COMPLETE  Result Date: 08/29/2021    ECHOCARDIOGRAM REPORT   Patient Name:   DEVONE BONILLA Date of Exam: 08/29/2021 Medical Rec #:  [id number]         Height:       61.0 in Accession #:    [id number]        Weight:       120.4 lb Date of Birth:  September 06, 1950         BSA:          1.522 m Patient Age:    76 years          BP:           164/90 mmHg Patient Gender: F                 HR:           94 bpm. Exam Location:  [facility] Procedure: 2D Echo, 3D Echo, Cardiac Doppler and Color Doppler Indications:    R07.9* Chest pain, unspecified  History:        Patient has prior history of Echocardiogram examinations, most                 recent 12/19/2016. COPD, Signs/Symptoms:Chest Pain and Altered                 Mental Status; Risk Factors:Hypertension and Dyslipidemia. ETOH.                 Opiate use.  Sonographer:    Roseanna Rainbow RDCS Referring Phys: [phone] Carmon Sahli IMPRESSIONS  1. Left ventricular ejection fraction, by estimation, is 60 to 65%. The left ventricle has normal function. The left ventricle has no regional wall motion abnormalities. There is mild left ventricular hypertrophy. Left ventricular diastolic parameters are consistent with Grade I diastolic dysfunction (impaired  relaxation).  2. Right ventricular systolic function is normal. The right ventricular size is normal.  3. The mitral valve is normal in structure. No evidence of mitral valve regurgitation. No evidence of mitral stenosis.  4. The aortic valve was not well visualized. There is mild calcification of the aortic valve. There is mild thickening of the aortic valve. Aortic valve regurgitation is not visualized. No aortic stenosis is present.  5. The inferior vena cava is normal in size with greater than 50% respiratory variability, suggesting right atrial pressure of 3 mmHg. FINDINGS  Left Ventricle: Left ventricular ejection fraction, by estimation, is 60 to 65%. The left ventricle has normal function. The left ventricle has no regional wall motion abnormalities. The left ventricular internal cavity size was normal in size. There is  mild left ventricular hypertrophy. Left ventricular  diastolic parameters are consistent with Grade I diastolic dysfunction (impaired relaxation). Normal left ventricular filling pressure. Right Ventricle: The right ventricular size is normal. Right vetricular wall thickness was not well visualized. Right ventricular systolic function is normal. Left Atrium: Left atrial size was not well visualized. Right Atrium: Right atrial size was not well visualized. Pericardium: There is no evidence of pericardial effusion. Mitral Valve: The mitral valve is normal in structure. No evidence of mitral valve regurgitation. No evidence of mitral valve stenosis. Tricuspid Valve: The tricuspid valve is not well visualized. Tricuspid valve regurgitation is not demonstrated. No evidence of tricuspid stenosis. Aortic Valve: The aortic valve was not well visualized. There is mild calcification of the aortic valve. There is mild thickening of the aortic valve. There is mild aortic valve annular calcification. Aortic valve regurgitation is not visualized. No aortic stenosis is present. Aortic valve mean gradient  measures 6.7 mmHg. Aortic valve peak gradient measures 11.7 mmHg. Aortic valve area, by VTI measures 1.85 cm. Pulmonic Valve: The pulmonic valve was not well visualized. Pulmonic valve regurgitation is not visualized. No evidence of pulmonic stenosis. Aorta: The aortic root is normal in size and structure. Venous: The inferior vena cava is normal in size with greater than 50% respiratory variability, suggesting right atrial pressure of 3 mmHg. IAS/Shunts: No atrial level shunt detected by color flow Doppler.  LEFT VENTRICLE PLAX 2D LVIDd:         3.60 cm     Diastology LVIDs:         2.30 cm     LV e' medial:    5.66 cm/s LV PW:         1.20 cm     LV E/e' medial:  15.2 LV IVS:        1.10 cm     LV e' lateral:   7.72 cm/s LVOT diam:     1.90 cm     LV E/e' lateral: 11.1 LV SV:         57 LV SV Index:   37 LVOT Area:     2.84 cm                             3D Volume EF: LV Volumes (MOD)           3D EF:        63 % LV vol d, MOD A2C: 61.4 ml LV EDV:       71 ml LV vol d, MOD A4C: 55.7 ml LV ESV:       26 ml LV vol s, MOD A2C: 21.9 ml LV SV:        45 ml LV vol s, MOD A4C: 21.5 ml LV SV MOD A2C:     39.5 ml LV SV MOD A4C:     55.7 ml LV SV MOD BP:      39.0 ml RIGHT VENTRICLE             IVC RV S prime:     14.80 cm/s  IVC diam: 1.20 cm TAPSE (M-mode): 1.9 cm LEFT ATRIUM             Index       RIGHT ATRIUM          Index LA diam:        2.50 cm 1.64 cm/m  RA Area:     8.40 cm LA Vol (A2C):   14.3  ml 9.39 ml/m  RA Volume:   12.50 ml 8.21 ml/m LA Vol (A4C):   8.5 ml  5.60 ml/m LA Biplane Vol: 11.1 ml 7.29 ml/m  AORTIC VALVE AV Area (Vmax):    1.92 cm AV Area (Vmean):   1.81 cm AV Area (VTI):     1.85 cm AV Vmax:           171.33 cm/s AV Vmean:          118.000 cm/s AV VTI:            0.308 m AV Peak Grad:      11.7 mmHg AV Mean Grad:      6.7 mmHg LVOT Vmax:         116.00 cm/s LVOT Vmean:        75.500 cm/s LVOT VTI:          0.201 m LVOT/AV VTI ratio: 0.65  AORTA Ao Root diam: 2.80 cm Ao Asc diam:  2.90  cm MITRAL VALVE MV Area (PHT): 3.85 cm     SHUNTS MV Decel Time: 197 msec     Systemic VTI:  0.20 m MV E velocity: 85.90 cm/s   Systemic Diam: 1.90 cm MV A velocity: 121.00 cm/s MV E/A ratio:  0.71 Carlyle Dolly MD Electronically signed by Carlyle Dolly MD Signature Date/Time: 08/29/2021/10:51:17 AM    Final     Microbiology: Results for orders placed or performed during the hospital encounter of 08/26/21  Resp Panel by RT-PCR (Flu A&B, Covid) Nasopharyngeal Swab     Status: None   Collection Time: 08/27/21  4:54 AM   Specimen: Nasopharyngeal Swab; Nasopharyngeal(NP) swabs in vial transport medium  Result Value Ref Range Status   SARS Coronavirus 2 by RT PCR NEGATIVE NEGATIVE Final    Comment: (NOTE) SARS-CoV-2 target nucleic acids are NOT DETECTED.  The SARS-CoV-2 RNA is generally detectable in upper respiratory specimens during the acute phase of infection. The lowest concentration of SARS-CoV-2 viral copies this assay can detect is 138 copies/mL. A negative result does not preclude SARS-Cov-2 infection and should not be used as the sole basis for treatment or other patient management decisions. A negative result may occur with  improper specimen collection/handling, submission of specimen other than nasopharyngeal swab, presence of viral mutation(s) within the areas targeted by this assay, and inadequate number of viral copies(<138 copies/mL). A negative result must be combined with clinical observations, patient history, and epidemiological information. The expected result is Negative.  Fact Sheet for Patients:  EntrepreneurPulse.com.au  Fact Sheet for Healthcare Providers:  IncredibleEmployment.be  This test is no t yet approved or cleared by the [place] FDA and  has been authorized for detection and/or diagnosis of SARS-CoV-2 by FDA under an Emergency Use Authorization (EUA). This EUA will remain  in effect (meaning this test can be  used) for the duration of the COVID-19 declaration under Section 564(b)(1) of the Act, 21 U.S.C.section 360bbb-3(b)(1), unless the authorization is terminated  or revoked sooner.       Influenza A by PCR NEGATIVE NEGATIVE Final   Influenza B by PCR NEGATIVE NEGATIVE Final    Comment: (NOTE) The Xpert Xpress SARS-CoV-2/FLU/RSV plus assay is intended as an aid in the diagnosis of influenza from Nasopharyngeal swab specimens and should not be used as a sole basis for treatment. Nasal washings and aspirates are unacceptable for Xpert Xpress SARS-CoV-2/FLU/RSV testing.  Fact Sheet for Patients: EntrepreneurPulse.com.au  Fact Sheet for Healthcare Providers: IncredibleEmployment.be  This test is  not yet approved or cleared by the [place] and has been authorized for detection and/or diagnosis of SARS-CoV-2 by FDA under an Emergency Use Authorization (EUA). This EUA will remain in effect (meaning this test can be used) for the duration of the COVID-19 declaration under Section 564(b)(1) of the Act, 21 U.S.C. section 360bbb-3(b)(1), unless the authorization is terminated or revoked.  Performed at [facility], [address], [place], [place] [address]   MRSA Next Gen by PCR, Nasal     Status: None   Collection Time: 08/27/21  6:54 AM   Specimen: Nasal Mucosa; Nasal Swab  Result Value Ref Range Status   MRSA by PCR Next Gen NOT DETECTED NOT DETECTED Final    Comment: (NOTE) The GeneXpert MRSA Assay (FDA approved for NASAL specimens only), is one component of a comprehensive MRSA colonization surveillance program. It is not intended to diagnose MRSA infection nor to guide or monitor treatment for MRSA infections. Test performance is not FDA approved in patients less than 62 years old. Performed at [facility], [address] [place], [place] [address]     Labs: CBC: Recent Labs  Lab 08/27/21 0120 08/28/21 0040 08/29/21 0442  WBC  14.5* 6.8 7.2  NEUTROABS 11.8*  --   --   HGB 11.2* 9.1* 10.3*  HCT 34.6* 29.2* 32.1*  MCV 82.2 83.9 82.9  PLT 194 151 092   Basic Metabolic Panel: Recent Labs  Lab 08/27/21 0120 08/28/21 0040 08/29/21 0442  NA 140 133* 132*  K 3.5 3.9 3.5  CL 98 99 100  CO2 29 27 24   GLUCOSE 151* 106* 97  BUN 21 18 10   CREATININE 0.79 1.03* 0.71  CALCIUM 8.0* 7.4* 8.5*  MG 1.0* 1.9 1.7  PHOS 4.3  --   --    Liver Function Tests: Recent Labs  Lab 08/27/21 0120 08/28/21 0040  AST 36 25  ALT 18 14  ALKPHOS 100 81  BILITOT 0.5 0.8  PROT 7.1 5.2*  ALBUMIN 3.5 2.7*   CBG: Recent Labs  Lab 08/26/21 2300  GLUCAP 194*    Discharge time spent: greater than 30 minutes.  Signed: Orson Eva, MD [facility] 08/29/2021

## 2021-08-29 NOTE — Progress Notes (Signed)
Pt arrived to room 304 via WC from ICU, ambulatory from [facility] to bed with only standby assist. Pt oriented to room and safety procedures, states understanding. Reminded of NPO statu for procedures this afternoon.

## 2021-08-29 NOTE — Anesthesia Postprocedure Evaluation (Signed)
Anesthesia Post Note  Patient: Stacy Dalton  Procedure(s) Performed: ESOPHAGOGASTRODUODENOSCOPY (EGD) WITH PROPOFOL BIOPSY  Patient location during evaluation: PACU Anesthesia Type: General Level of consciousness: awake and alert and oriented Pain management: pain level controlled Vital Signs Assessment: post-procedure vital signs reviewed and stable Respiratory status: spontaneous breathing, nonlabored ventilation and respiratory function stable Cardiovascular status: blood pressure returned to baseline and stable Postop Assessment: no apparent nausea or vomiting Anesthetic complications: no   No notable events documented.   Last Vitals:  Vitals:   08/29/21 1155 08/29/21 1213  BP: (!) 146/67 (!) 157/90  Pulse: 80 81  Resp: 17 18  Temp:  36.6 C  SpO2:  97%    Last Pain:  Vitals:   08/29/21 1213  TempSrc: Oral  PainSc:                  Kush Farabee C Evola Hollis

## 2021-08-29 NOTE — Transfer of Care (Signed)
Immediate Anesthesia Transfer of Care Note  Patient: Stacy Dalton  Procedure(s) Performed: ESOPHAGOGASTRODUODENOSCOPY (EGD) WITH PROPOFOL BIOPSY  Patient Location: PACU  Anesthesia Type:MAC  Level of Consciousness: awake, alert , oriented and patient cooperative  Airway & Oxygen Therapy: Patient Spontanous Breathing and Patient connected to nasal cannula oxygen  Post-op Assessment: Report given to RN, Post -op Vital signs reviewed and stable and Patient moving all extremities  Post vital signs: Reviewed and stable  Last Vitals:  Vitals Value Taken Time  BP    Temp    Pulse    Resp    SpO2      Last Pain:  Vitals:   08/29/21 1110  TempSrc:   PainSc: 8       Patients Stated Pain Goal: 4 (08/13/13 5208)  Complications: No notable events documented.

## 2021-08-29 NOTE — Op Note (Signed)
[facility] Patient Name: Stacy Dalton Procedure Date: 08/29/2021 10:30 AM MRN: [id number] Date of Birth: [birth year] Attending MD: Elon Alas. Abbey Chatters DO CSN: [id number] Age: 70 Admit Type: Inpatient Procedure:                Upper GI endoscopy Indications:              Dysphagia, Abnormal CT of the GI tract, Nausea with                            vomiting Providers:                Elon Alas. Abbey Chatters, DO, Janeece Riggers, RN, Lambert Mody, Kristine L. Risa Grill, Technician Referring MD:              Medicines:                See the Anesthesia note for documentation of the                            administered medications Complications:            No immediate complications. Estimated Blood Loss:     Estimated blood loss was minimal. Procedure:                Pre-Anesthesia Assessment:                           - The anesthesia plan was to use monitored                            anesthesia care (MAC).                           After obtaining informed consent, the endoscope was                            passed under direct vision. Throughout the                            procedure, the patient's blood pressure, pulse, and                            oxygen saturations were monitored continuously. The                            [id number]-H190 ([id number]) scope was introduced through the                            mouth, and advanced to the second part of duodenum.                            The upper GI endoscopy was accomplished without  difficulty. The patient tolerated the procedure                            well. Scope In: 11:14:38 AM Scope Out: 11:19:22 AM Total Procedure Duration: 0 hours 4 minutes 44 seconds  Findings:      A medium-sized hiatal hernia was present.      A mild Schatzki ring was found in the distal esophagus.      LA Grade C (one or more mucosal breaks continuous between tops of 2 or       more  mucosal folds, less than 75% circumference) esophagitis with no       bleeding was found in the lower third of the esophagus.      Diffuse moderate inflammation characterized by erosions and erythema was       found in the gastric body. Biopsies were taken with a cold forceps for       Helicobacter pylori testing.      The duodenal bulb, first portion of the duodenum and second portion of       the duodenum were normal. Impression:               - Medium-sized hiatal hernia.                           - Mild Schatzki ring.                           - LA Grade C reflux esophagitis with no bleeding.                           - Gastritis. Biopsied.                           - Normal duodenal bulb, first portion of the                            duodenum and second portion of the duodenum. Moderate Sedation:      Per Anesthesia Care Recommendation:           - Return patient to hospital ward for ongoing care.                           - Soft diet.                           - Continue on IV Protonix twice daily while                            inpatient. Will need aggressive p.o. PPI therapy                            twice daily upon discharge. We will also add liquid                            Carafate 4 times daily. Repeat EGD in 8 weeks once  inflammation has improved for both esophageal                            dilation, as well as biopsies to rule out Barrett's                            esophagus. Procedure Code(s):        --- Professional ---                           [phone], Esophagogastroduodenoscopy, flexible,                            transoral; with biopsy, single or multiple Diagnosis Code(s):        --- Professional ---                           K44.9, Diaphragmatic hernia without obstruction or                            gangrene                           K22.2, Esophageal obstruction                           K21.00, Gastro-esophageal reflux  disease with                            esophagitis, without bleeding                           K29.70, Gastritis, unspecified, without bleeding                           R13.10, Dysphagia, unspecified                           R11.2, Nausea with vomiting, unspecified                           R93.3, Abnormal findings on diagnostic imaging of                            other parts of digestive tract CPT copyright 2019 [employer] Association. All rights reserved. The codes documented in this report are preliminary and upon coder review may  be revised to meet current compliance requirements. Elon Alas. Abbey Chatters, DO [place] Abbey Chatters, DO 08/29/2021 11:24:19 AM This report has been signed electronically. Number of Addenda: 0

## 2021-08-29 NOTE — Evaluation (Signed)
Physical Therapy Evaluation Patient Details Name: Stacy Dalton MRN: [id number] DOB: 1951-03-03 Today's Date: 08/29/2021  History of Present Illness  Stacy Dalton is a 71 y.o. female with medical history significant for severe alcohol dependence, recurrent DTs, COPD, hypertension, hypothyroidism, chronic pain, depression, and anxiety who presented to the ER by EMS due to alcohol withdrawal symptoms.  Patient states that she had her last drink (vodka) this morning and that she has since developed generalized abdominal pain associated with nausea and nonbloody vomiting.  Patient states that she wanted to quit alcohol use and that her family were trying to get into a rehab.  She denies chest pain, fever, chills, headache, blurry vision.   Clinical Impression  Patient functioning near baseline for functional mobility and gait demonstrating good return for ambulation in room and hallway without loss of balance.  Patient tolerated sitting up in chair after therapy - nursing staff aware.  Plan:  Patient discharged from physical therapy to care of nursing for ambulation daily as tolerated for length of stay.          Recommendations for follow up therapy are one component of a multi-disciplinary discharge planning process, led by the attending physician.  Recommendations may be updated based on patient status, additional functional criteria and insurance authorization.  Follow Up Recommendations No PT follow up    Assistance Recommended at Discharge PRN  Patient can return home with the following  Help with stairs or ramp for entrance;Other (comment) (near baseline)    Equipment Recommendations None recommended by PT  Recommendations for Other Services       Functional Status Assessment Patient has not had a recent decline in their functional status     Precautions / Restrictions Precautions Precautions: None Restrictions Weight Bearing Restrictions: No      Mobility  Bed  Mobility Overal bed mobility: Modified Independent                  Transfers Overall transfer level: Modified independent                      Ambulation/Gait Ambulation/Gait assistance: Modified independent (Device/Increase time) Gait Distance (Feet): 120 Feet Assistive device: None Gait Pattern/deviations: Decreased step length - right, Decreased step length - left, Decreased stride length Gait velocity: slightly decreased     General Gait Details: good return for ambulation in room and hallway without loss of balance, limited mostly due to c/o fatigue  Stairs            Wheelchair Mobility    Modified Rankin (Stroke Patients Only)       Balance Overall balance assessment: No apparent balance deficits (not formally assessed)                                           Pertinent Vitals/Pain Pain Assessment Pain Assessment: No/denies pain    Home Living Family/patient expects to be discharged to:: Private residence Living Arrangements: Alone Available Help at Discharge: Friend(s);Available PRN/intermittently Type of Home: House Home Access: Stairs to enter Entrance Stairs-Rails: None Entrance Stairs-Number of Steps: 1-2   Home Layout: One level Home Equipment: Conservation officer, nature (2 wheels);BSC/3in1;Shower seat;Toilet riser Additional Comments: pt reports her daughter lives next door and can stay with her for a couple of nights if needed    Prior Function Prior Level of Function : Needs assist  Physical Assist : Mobility (physical);ADLs (physical) Mobility (physical): Bed mobility;Transfers;Gait;Stairs   Mobility Comments: Hydrographic surveyor, drives rarely ADLs Comments: assisted by family/friends     Hand Dominance   Dominant Hand: Right    Extremity/Trunk Assessment   Upper Extremity Assessment Upper Extremity Assessment: Overall WFL for tasks assessed    Lower Extremity Assessment Lower Extremity  Assessment: Overall WFL for tasks assessed    Cervical / Trunk Assessment Cervical / Trunk Assessment: Normal  Communication   Communication: No difficulties  Cognition Arousal/Alertness: Awake/alert Behavior During Therapy: WFL for tasks assessed/performed Overall Cognitive Status: Within Functional Limits for tasks assessed                                          General Comments      Exercises     Assessment/Plan    PT Assessment Patient does not need any further PT services  PT Problem List         PT Treatment Interventions      PT Goals (Current goals can be found in the Care Plan section)  Acute Rehab PT Goals Patient Stated Goal: return home with family/friends to assist PT Goal Formulation: With patient Time For Goal Achievement: 08/29/21 Potential to Achieve Goals: Good    Frequency       Co-evaluation               AM-PAC PT "6 Clicks" Mobility  Outcome Measure Help needed turning from your back to your side while in a flat bed without using bedrails?: None Help needed moving from lying on your back to sitting on the side of a flat bed without using bedrails?: None Help needed moving to and from a bed to a chair (including a wheelchair)?: None Help needed standing up from a chair using your arms (e.g., wheelchair or bedside chair)?: None Help needed to walk in hospital room?: None Help needed climbing 3-5 steps with a railing? : A Little 6 Click Score: 23    End of Session   Activity Tolerance: Patient tolerated treatment well;Patient limited by fatigue Patient left: in chair;with call bell/phone within reach Nurse Communication: Mobility status PT Visit Diagnosis: Unsteadiness on feet (R26.81);Other abnormalities of gait and mobility (R26.89);Muscle weakness (generalized) (M62.81)    Time: 2633-3545 PT Time Calculation (min) (ACUTE ONLY): 14 min   Charges:   PT Evaluation $PT Eval Low Complexity: 1 Low PT  Treatments $Therapeutic Activity: 8-22 mins        3:24 PM, 08/29/21 Lonell Grandchild, MPT Physical Therapist with [facility] [phone] office [phone] mobile phone

## 2021-08-29 NOTE — TOC Transition Note (Signed)
Transition of Care [facility]) - CM/SW Discharge Note   Patient Details  Name: Stacy Dalton MRN: [id number] Date of Birth: 16-May-1951  [facility] of [facility] CM/SW Contact:  Salome Arnt, LCSW Phone Number: 08/29/2021, 3:04 PM   Clinical Narrative:  Per PT, no d/c needs. Pt will d/c today. Discussed with pt who confirms no needs. She has a ride this afternoon.      Final next level of care: Home/Self Care Barriers to Discharge: Continued Medical Work up   Patient Goals and CMS Choice Patient states their goals for this hospitalization and ongoing recovery are:: unsure at this time   Choice offered to / list presented to : Patient  Discharge Placement                       Discharge Plan and Services In-house Referral: Clinical Social Work                                   Social Determinants of Health (SDOH) Interventions     Readmission Risk Interventions Readmission Risk Prevention Plan 08/28/2021 06/08/2021 04/19/2021  Transportation Screening - Complete -  Medication Review Press photographer) Complete Complete -  PCP or Specialist appointment within 3-5 days of discharge - - Complete  HRI or Home Care Consult Complete Complete -  SW Recovery Care/Counseling Consult Complete Complete -  Palliative Care Screening Not Applicable Not Applicable -  [place] (No Data) Not Applicable -  Some recent data might be hidden

## 2021-08-29 NOTE — Interval H&P Note (Signed)
History and Physical Interval Note:  08/29/2021 10:19 AM  Stacy Dalton  has presented today for surgery, with the diagnosis of Nausea and vomiting.  Thickened esophageal wall on CT.[name]  The various methods of treatment have been discussed with the patient and family. After consideration of risks, benefits and other options for treatment, the patient has consented to  Procedure(s): ESOPHAGOGASTRODUODENOSCOPY (EGD) WITH PROPOFOL (N/A) as a surgical intervention.  The patient's history has been reviewed, patient examined, no change in status, stable for surgery.  I have reviewed the patient's chart and labs.  Questions were answered to the patient's satisfaction.     [name]

## 2021-08-29 NOTE — Telephone Encounter (Signed)
OV sch'd 09/20/21, apt note mailed to pateint

## 2021-08-30 DIAGNOSIS — Z20822 Contact with and (suspected) exposure to covid-19: Secondary | ICD-10-CM | POA: Diagnosis not present

## 2021-08-30 LAB — SURGICAL PATHOLOGY

## 2021-08-31 ENCOUNTER — Emergency Department (HOSPITAL_COMMUNITY)
Admission: EM | Admit: 2021-08-31 | Discharge: 2021-08-31 | Disposition: A | Discharge status: Home / Self Care | Payer: Medicare Other | Attending: Emergency Medicine | Admitting: Emergency Medicine

## 2021-08-31 ENCOUNTER — Other Ambulatory Visit: Payer: Self-pay

## 2021-08-31 ENCOUNTER — Encounter (HOSPITAL_COMMUNITY): Payer: Self-pay

## 2021-08-31 DIAGNOSIS — R4182 Altered mental status, unspecified: Secondary | ICD-10-CM | POA: Diagnosis not present

## 2021-08-31 DIAGNOSIS — Z20822 Contact with and (suspected) exposure to covid-19: Secondary | ICD-10-CM | POA: Diagnosis not present

## 2021-08-31 DIAGNOSIS — H9202 Otalgia, left ear: Secondary | ICD-10-CM | POA: Diagnosis not present

## 2021-08-31 DIAGNOSIS — F10229 Alcohol dependence with intoxication, unspecified: Secondary | ICD-10-CM | POA: Diagnosis not present

## 2021-08-31 DIAGNOSIS — J449 Chronic obstructive pulmonary disease, unspecified: Secondary | ICD-10-CM | POA: Diagnosis not present

## 2021-08-31 DIAGNOSIS — Z79899 Other long term (current) drug therapy: Secondary | ICD-10-CM | POA: Diagnosis not present

## 2021-08-31 DIAGNOSIS — F10920 Alcohol use, unspecified with intoxication, uncomplicated: Secondary | ICD-10-CM

## 2021-08-31 LAB — CBC WITH DIFFERENTIAL/PLATELET
Abs Immature Granulocytes: 0.14 10*3/uL — ABNORMAL HIGH (ref 0.00–0.07)
Basophils Absolute: 0.1 10*3/uL (ref 0.0–0.1)
Basophils Relative: 1 %
Eosinophils Absolute: 0.2 10*3/uL (ref 0.0–0.5)
Eosinophils Relative: 3 %
HCT: 37.4 % (ref 36.0–46.0)
Hemoglobin: 11.6 g/dL — ABNORMAL LOW (ref 12.0–15.0)
Immature Granulocytes: 2 %
Lymphocytes Relative: 24 %
Lymphs Abs: 1.7 10*3/uL (ref 0.7–4.0)
MCH: 26.2 pg (ref 26.0–34.0)
MCHC: 31 g/dL (ref 30.0–36.0)
MCV: 84.4 fL (ref 80.0–100.0)
Monocytes Absolute: 0.7 10*3/uL (ref 0.1–1.0)
Monocytes Relative: 11 %
Neutro Abs: 4.1 10*3/uL (ref 1.7–7.7)
Neutrophils Relative %: 59 %
Platelets: 315 10*3/uL (ref 150–400)
RBC: 4.43 MIL/uL (ref 3.87–5.11)
RDW: 20.8 % — ABNORMAL HIGH (ref 11.5–15.5)
WBC: 6.9 10*3/uL (ref 4.0–10.5)
nRBC: 0 % (ref 0.0–0.2)

## 2021-08-31 LAB — URINALYSIS, ROUTINE W REFLEX MICROSCOPIC
Bilirubin Urine: NEGATIVE
Glucose, UA: NEGATIVE mg/dL
Hgb urine dipstick: NEGATIVE
Ketones, ur: NEGATIVE mg/dL
Leukocytes,Ua: NEGATIVE
Nitrite: NEGATIVE
Protein, ur: NEGATIVE mg/dL
Specific Gravity, Urine: 1.005 (ref 1.005–1.030)
pH: 6 (ref 5.0–8.0)

## 2021-08-31 LAB — ETHANOL: Alcohol, Ethyl (B): 363 mg/dL (ref <10)

## 2021-08-31 LAB — COMPREHENSIVE METABOLIC PANEL
ALT: 21 U/L (ref 0–44)
AST: 31 U/L (ref 15–41)
Albumin: 3.8 g/dL (ref 3.5–5.0)
Alkaline Phosphatase: 92 U/L (ref 38–126)
Anion gap: 12 (ref 5–15)
BUN: 17 mg/dL (ref 8–23)
CO2: 23 mmol/L (ref 22–32)
Calcium: 8.8 mg/dL — ABNORMAL LOW (ref 8.9–10.3)
Chloride: 109 mmol/L (ref 98–111)
Creatinine, Ser: 0.72 mg/dL (ref 0.44–1.00)
GFR, Estimated: >60 mL/min (ref >60)
Glucose, Bld: 85 mg/dL (ref 70–99)
Potassium: 3.8 mmol/L (ref 3.5–5.1)
Sodium: 144 mmol/L (ref 135–145)
Total Bilirubin: 0.2 mg/dL — ABNORMAL LOW (ref 0.3–1.2)
Total Protein: 7.4 g/dL (ref 6.5–8.1)

## 2021-08-31 LAB — ACETAMINOPHEN LEVEL: Acetaminophen (Tylenol), Serum: <10 ug/mL — ABNORMAL LOW (ref 10–30)

## 2021-08-31 LAB — LIPASE, BLOOD: Lipase: 62 U/L — ABNORMAL HIGH (ref 11–51)

## 2021-08-31 LAB — RAPID URINE DRUG SCREEN, HOSP PERFORMED
Amphetamines: NOT DETECTED
Barbiturates: NOT DETECTED
Benzodiazepines: NOT DETECTED
Cocaine: NOT DETECTED
Opiates: NOT DETECTED
Tetrahydrocannabinol: NOT DETECTED

## 2021-08-31 LAB — RESP PANEL BY RT-PCR (FLU A&B, COVID) ARPGX2
Influenza A by PCR: NEGATIVE
Influenza B by PCR: NEGATIVE
SARS Coronavirus 2 by RT PCR: NEGATIVE

## 2021-08-31 LAB — SALICYLATE LEVEL: Salicylate Lvl: <7 mg/dL — ABNORMAL LOW (ref 7.0–30.0)

## 2021-08-31 MED ORDER — FOLIC ACID 1 MG PO TABS
1.0000 mg | ORAL_TABLET | Freq: Every day | ORAL | Status: DC
  Administered 2021-08-31: 1 mg via ORAL
  Filled 2021-08-31: qty 1

## 2021-08-31 MED ORDER — THIAMINE HCL 100 MG PO TABS
100.0000 mg | ORAL_TABLET | Freq: Every day | ORAL | Status: DC
  Administered 2021-08-31: 100 mg via ORAL
  Filled 2021-08-31: qty 1

## 2021-08-31 MED ORDER — PANTOPRAZOLE SODIUM 40 MG PO TBEC: 40.0000 mg | DELAYED_RELEASE_TABLET | Freq: Two times a day (BID) | ORAL | Status: DC

## 2021-08-31 MED ORDER — SUCRALFATE 1 GM/10ML PO SUSP: 1.0000 g | Freq: Three times a day (TID) | ORAL | Status: DC

## 2021-08-31 MED ORDER — DULOXETINE HCL 30 MG PO CPEP
60.0000 mg | ORAL_CAPSULE | Freq: Every day | ORAL | Status: DC
  Administered 2021-08-31: 60 mg via ORAL
  Filled 2021-08-31: qty 2

## 2021-08-31 MED ORDER — ALBUTEROL SULFATE HFA 108 (90 BASE) MCG/ACT IN AERS: 2.0000 | INHALATION_SPRAY | RESPIRATORY_TRACT | Status: DC | PRN

## 2021-08-31 MED ORDER — VITAMIN B-12 1000 MCG PO TABS
1000.0000 ug | ORAL_TABLET | Freq: Every day | ORAL | Status: DC
  Administered 2021-08-31: 1000 ug via ORAL
  Filled 2021-08-31: qty 1

## 2021-08-31 MED ORDER — AMLODIPINE BESYLATE 5 MG PO TABS
10.0000 mg | ORAL_TABLET | Freq: Every day | ORAL | Status: DC
  Administered 2021-08-31: 10 mg via ORAL
  Filled 2021-08-31: qty 2

## 2021-08-31 MED ORDER — LEVOTHYROXINE SODIUM 50 MCG PO TABS: 50.0000 ug | ORAL_TABLET | Freq: Every day | ORAL | Status: DC

## 2021-08-31 MED ORDER — LORAZEPAM 1 MG PO TABS
0.0000 mg | ORAL_TABLET | Freq: Four times a day (QID) | ORAL | Status: DC
  Administered 2021-08-31: 1 mg via ORAL
  Filled 2021-08-31: qty 1

## 2021-08-31 MED ORDER — ADULT MULTIVITAMIN W/MINERALS CH
1.0000 | ORAL_TABLET | Freq: Every day | ORAL | Status: DC
  Administered 2021-08-31: 1 via ORAL
  Filled 2021-08-31: qty 1

## 2021-08-31 MED ORDER — LORAZEPAM 1 MG PO TABS: 0.0000 mg | ORAL_TABLET | Freq: Two times a day (BID) | ORAL | Status: DC

## 2021-08-31 MED ORDER — LORAZEPAM 2 MG/ML IJ SOLN: 0.0000 mg | Freq: Four times a day (QID) | INTRAMUSCULAR | Status: DC

## 2021-08-31 MED ORDER — LORAZEPAM 2 MG/ML IJ SOLN: 0.0000 mg | Freq: Two times a day (BID) | INTRAMUSCULAR | Status: DC

## 2021-08-31 NOTE — ED Notes (Signed)
The patient stated she is ready to go home and has called for a ride. The patient was informed that it will be against medical advice.  ?

## 2021-08-31 NOTE — ED Notes (Signed)
Dr at bedside during triage.  ?

## 2021-08-31 NOTE — ED Notes (Signed)
Pt cleaned up and new clothes applied. Pts clothing in bag,. ?

## 2021-08-31 NOTE — ED Provider Notes (Signed)
[facility] EMERGENCY DEPARTMENT Provider Note   CSN: [id number] Arrival date & time: 08/31/21  1511     History  Chief Complaint  Patient presents with   Altered Mental Status    Stacy Dalton is a 71 y.o. female.  She has a history of chronic pain COPD alcohol abuse.  She was found by family less responsive today.  There was an old bottle of tramadol near her and there was some concern that maybe she had taken them.  Patient herself denies any overdose.  She does admit to alcohol.  She is complaining of some left ear pain which she says is an old problem.  Then she states she wants to die.  Tearful and labile.  The history is provided by the patient and the EMS personnel.  Altered Mental Status Presenting symptoms: partial responsiveness   Progression:  Unchanged Chronicity:  New Context: alcohol use   Associated symptoms: no abdominal pain, no difficulty breathing, no fever, no headaches, no nausea and no vomiting       Home Medications Prior to Admission medications   Medication Sig Start Date End Date Taking? Authorizing Provider  albuterol (VENTOLIN HFA) 108 (90 Base) MCG/ACT inhaler Inhale 2 puffs into the lungs every 4 (four) hours as needed for wheezing or shortness of breath. 08/21/21   Orpah Greek, MD  amLODipine (NORVASC) 10 MG tablet Take 1 tablet (10 mg total) by mouth daily. 08/30/21   Orson Eva, MD  DULoxetine (CYMBALTA) 60 MG capsule Take 1 capsule (60 mg total) by mouth daily. 08/08/21   Evelina Dun A, FNP  fluticasone-salmeterol (ADVAIR DISKUS) 250-50 MCG/ACT AEPB Inhale 1 puff into the lungs in the morning and at bedtime. Patient not taking: Reported on 05/25/2021 04/19/21 05/20/21  Deatra James, MD  folic acid (FOLVITE) 1 MG tablet Take 1 tablet (1 mg total) by mouth daily. 05/22/21   Regalado, Jerald Kief A, MD  levothyroxine (SYNTHROID) 50 MCG tablet Take 1 tablet (50 mcg total) by mouth daily before breakfast. 06/22/20   [name], FNP   Multiple Vitamin (MULTIVITAMIN WITH MINERALS) TABS tablet Take 1 tablet by mouth daily. 05/22/21   Regalado, Belkys A, MD  ondansetron (ZOFRAN) 4 MG tablet Take 1 tablet (4 mg total) by mouth every 8 (eight) hours as needed for nausea or vomiting. 08/08/21   Evelina Dun A, FNP  pantoprazole (PROTONIX) 40 MG tablet Take 1 tablet (40 mg total) by mouth 2 (two) times daily. 08/29/21   Orson Eva, MD  sucralfate (CARAFATE) 1 GM/10ML suspension Take 10 mLs (1 g total) by mouth 4 (four) times daily -  with meals and at bedtime. 08/29/21   Orson Eva, MD  thiamine 100 MG tablet Take 1 tablet (100 mg total) by mouth daily. 05/22/21   Regalado, Belkys A, MD  vitamin B-12 (CYANOCOBALAMIN) 1000 MCG tablet Take 1 tablet (1,000 mcg total) by mouth daily. 05/21/21   Regalado, Belkys A, MD  Vitamin D, Ergocalciferol, (DRISDOL) 1.25 MG (50000 UNIT) CAPS capsule Take 1 capsule (50,000 Units total) by mouth every 7 (seven) days. 06/22/20   [name], FNP      Allergies    Librium [chlordiazepoxide], Toradol [ketorolac tromethamine], Butalbital-apap-caffeine, Demerol, Esgic [butalbital-apap-caffeine], Iodine, and Pyridium [phenazopyridine]    Review of Systems   Review of Systems  Unable to perform ROS: Mental status change  Constitutional:  Negative for fever.  HENT:  Positive for ear pain.   Gastrointestinal:  Negative for abdominal pain, nausea  and vomiting.  Neurological:  Negative for headaches.   Physical Exam Updated Vital Signs BP (!) 141/73    Pulse 85    Temp 98.7 F (37.1 C) (Oral)    Resp 14    Ht 5\' 1"  (1.549 m)    Wt 54.6 kg    SpO2 100%    BMI 22.74 kg/m  Physical Exam Vitals and nursing note reviewed.  Constitutional:      General: She is not in acute distress.    Appearance: Normal appearance. She is well-developed.  HENT:     Head: Normocephalic and atraumatic.     Right Ear: Tympanic membrane normal.     Left Ear: Tympanic membrane and external ear normal.     Ears:      Comments: Some cerumen in her left ear canal.  No drainage. Eyes:     Conjunctiva/sclera: Conjunctivae normal.  Cardiovascular:     Rate and Rhythm: Normal rate and regular rhythm.     Heart sounds: No murmur heard. Pulmonary:     Effort: Pulmonary effort is normal. No respiratory distress.     Breath sounds: Normal breath sounds.  Abdominal:     Palpations: Abdomen is soft.     Tenderness: There is no abdominal tenderness. There is no guarding or rebound.  Musculoskeletal:        General: No swelling. Normal range of motion.     Cervical back: Neck supple.  Skin:    General: Skin is warm and dry.     Capillary Refill: Capillary refill takes less than 2 seconds.  Neurological:     General: No focal deficit present.     Mental Status: She is alert. She is disoriented.     Sensory: No sensory deficit.     Motor: No weakness.     Comments: She is arousable to voice.  She does not know what day it is or where she is.    ED Results / Procedures / Treatments   Labs (all labs ordered are listed, but only abnormal results are displayed) Labs Reviewed  URINALYSIS, ROUTINE W REFLEX MICROSCOPIC - Abnormal; Notable for the following components:      Result Value   Color, Urine STRAW (*)    All other components within normal limits  ACETAMINOPHEN LEVEL - Abnormal; Notable for the following components:   Acetaminophen (Tylenol), Serum <10 (*)    All other components within normal limits  ETHANOL - Abnormal; Notable for the following components:   Alcohol, Ethyl (B) 363 (*)    All other components within normal limits  CBC WITH DIFFERENTIAL/PLATELET - Abnormal; Notable for the following components:   Hemoglobin 11.6 (*)    RDW 20.8 (*)    Abs Immature Granulocytes 0.14 (*)    All other components within normal limits  COMPREHENSIVE METABOLIC PANEL - Abnormal; Notable for the following components:   Calcium 8.8 (*)    Total Bilirubin 0.2 (*)    All other components within normal  limits  LIPASE, BLOOD - Abnormal; Notable for the following components:   Lipase 62 (*)    All other components within normal limits  SALICYLATE LEVEL - Abnormal; Notable for the following components:   Salicylate Lvl <1.8 (*)    All other components within normal limits  RESP PANEL BY RT-PCR (FLU A&B, COVID) ARPGX2  RAPID URINE DRUG SCREEN, HOSP PERFORMED    EKG EKG Interpretation  Date/Time:  Wednesday August 31 2021 16:05:34 EST Ventricular Rate:  78  PR Interval:  131 QRS Duration: 82 QT Interval:  374 QTC Calculation: 426 R Axis:   90 Text Interpretation: Sinus rhythm Borderline right axis deviation No significant change since last tracing Confirmed by Aletta Edouard [phone]) on 08/31/2021 4:14:53 PM  Radiology No results found.  Procedures Procedures    Medications Ordered in ED Medications - No data to display  ED Course/ Medical Decision Making/ A&P Clinical Course as of 09/01/21 0831  Wed Aug 31, 2021  1926 Patient states she is not suicidal now and wants to call someone for a ride.  On review of prior records this is not unusual behavior for this patient. [MB]    Clinical Course User Index [MB] Hayden Rasmussen, MD                           Medical Decision Making Amount and/or Complexity of Data Reviewed Labs: ordered.  This patient complains of confusion and left ear pain; this involves an extensive number of treatment Options and is a complaint that carries with it a high risk of complications and morbidity. The differential includes intoxication, otitis media, otitis externa, metabolic derangement, mental health crisis  I ordered, reviewed and interpreted labs, which included CBC with normal white count, hemoglobin low stable from priors, chemistries and LFTs unremarkable, urinalysis without signs of infection, alcohol markedly elevated, aspirin Tylenol negative I ordered medication Home medications and reviewed PMP when indicated. Additional history  obtained from EMS Previous records obtained and reviewed in epic, frequent ED visits for alcohol intoxication I consulted TTS and discussed lab and imaging findings and discussed disposition.  Cardiac monitoring reviewed, normal sinus rhythm Social determinants considered, alcohol abuse, social isolation Critical Interventions: None  After the interventions stated above, I reevaluated the patient and found patient to have intermittent outbursts but redirectable. Admission and further testing considered, no indications for admission at this time.  She was offered mental health evaluation.  Do not feel she needs IVC at this time.          Final Clinical Impression(s) / ED Diagnoses Final diagnoses:  Alcoholic intoxication without complication ([place])  Left ear pain    Rx / DC Orders ED Discharge Orders     None         Hayden Rasmussen, MD 09/01/21 [phone]

## 2021-08-31 NOTE — ED Notes (Signed)
TTS at this time. 

## 2021-08-31 NOTE — BH Assessment (Signed)
@  1746, requested patient's nurse Vaughan Basta, RN) to move the TTS machine to patient's room. Awaiting a response from his nurse.  ? ?

## 2021-08-31 NOTE — ED Triage Notes (Signed)
Pt came in [facility] for AMS. Daughter wants her checked for possible overdose of tramadol. Pt smells like alcohol and has urinated on herself. Pt ambulated from stretcher to bed. V/S WNL , CBG 157 per EMS. Pt only c/o ear pain.  ?

## 2021-08-31 NOTE — ED Notes (Signed)
Son given update on pt. He stated to call him when pt is ready to be discharged so he can get her a ride home.  ?

## 2021-08-31 NOTE — BH Assessment (Addendum)
TTS Clinician attempted to assess patient, however; she appeared to be severely intoxicated, not appropriate for a  TTS assessment at this time. The [facility] provider Jinny Blossom, NP), recommends that patient is re-assessed in the am by a [facility] provider. BAL was 363 @ 3:53 pm.  ? ?EDP and nursing provided updates at noted above.  ?

## 2021-09-01 ENCOUNTER — Encounter (HOSPITAL_COMMUNITY): Payer: Self-pay | Admitting: Internal Medicine

## 2021-09-08 ENCOUNTER — Ambulatory Visit: Payer: Medicare Other | Admitting: Family

## 2021-09-10 ENCOUNTER — Emergency Department (HOSPITAL_COMMUNITY): Payer: Medicare Other

## 2021-09-10 ENCOUNTER — Emergency Department (HOSPITAL_COMMUNITY)
Admission: EM | Admit: 2021-09-10 | Discharge: 2021-09-10 | Disposition: A | Discharge status: Home / Self Care | Payer: Medicare Other | Attending: Emergency Medicine | Admitting: Emergency Medicine

## 2021-09-10 ENCOUNTER — Other Ambulatory Visit: Payer: Self-pay

## 2021-09-10 ENCOUNTER — Encounter (HOSPITAL_COMMUNITY): Payer: Self-pay

## 2021-09-10 DIAGNOSIS — Z79899 Other long term (current) drug therapy: Secondary | ICD-10-CM | POA: Diagnosis not present

## 2021-09-10 DIAGNOSIS — I10 Essential (primary) hypertension: Secondary | ICD-10-CM | POA: Insufficient documentation

## 2021-09-10 DIAGNOSIS — S0990XA Unspecified injury of head, initial encounter: Secondary | ICD-10-CM | POA: Diagnosis not present

## 2021-09-10 DIAGNOSIS — K209 Esophagitis, unspecified without bleeding: Secondary | ICD-10-CM | POA: Insufficient documentation

## 2021-09-10 DIAGNOSIS — R072 Precordial pain: Secondary | ICD-10-CM

## 2021-09-10 DIAGNOSIS — R519 Headache, unspecified: Secondary | ICD-10-CM | POA: Insufficient documentation

## 2021-09-10 DIAGNOSIS — R0789 Other chest pain: Secondary | ICD-10-CM | POA: Diagnosis not present

## 2021-09-10 DIAGNOSIS — I6782 Cerebral ischemia: Secondary | ICD-10-CM | POA: Diagnosis not present

## 2021-09-10 DIAGNOSIS — R079 Chest pain, unspecified: Secondary | ICD-10-CM | POA: Diagnosis not present

## 2021-09-10 DIAGNOSIS — Z87898 Personal history of other specified conditions: Secondary | ICD-10-CM

## 2021-09-10 DIAGNOSIS — R000 Tachycardia, unspecified: Secondary | ICD-10-CM | POA: Diagnosis not present

## 2021-09-10 LAB — TROPONIN I (HIGH SENSITIVITY)
Troponin I (High Sensitivity): 12 ng/L (ref <18)
Troponin I (High Sensitivity): 14 ng/L (ref <18)

## 2021-09-10 LAB — COMPREHENSIVE METABOLIC PANEL
ALT: 29 U/L (ref 0–44)
AST: 43 U/L — ABNORMAL HIGH (ref 15–41)
Albumin: 4.1 g/dL (ref 3.5–5.0)
Alkaline Phosphatase: 92 U/L (ref 38–126)
Anion gap: 15 (ref 5–15)
BUN: 24 mg/dL — ABNORMAL HIGH (ref 8–23)
CO2: 24 mmol/L (ref 22–32)
Calcium: 9.2 mg/dL (ref 8.9–10.3)
Chloride: 98 mmol/L (ref 98–111)
Creatinine, Ser: 0.78 mg/dL (ref 0.44–1.00)
GFR, Estimated: >60 mL/min (ref >60)
Glucose, Bld: 128 mg/dL — ABNORMAL HIGH (ref 70–99)
Potassium: 3.4 mmol/L — ABNORMAL LOW (ref 3.5–5.1)
Sodium: 137 mmol/L (ref 135–145)
Total Bilirubin: 1.5 mg/dL — ABNORMAL HIGH (ref 0.3–1.2)
Total Protein: 7.1 g/dL (ref 6.5–8.1)

## 2021-09-10 LAB — CBC
HCT: 34.7 % — ABNORMAL LOW (ref 36.0–46.0)
Hemoglobin: 11.1 g/dL — ABNORMAL LOW (ref 12.0–15.0)
MCH: 26.2 pg (ref 26.0–34.0)
MCHC: 32 g/dL (ref 30.0–36.0)
MCV: 81.8 fL (ref 80.0–100.0)
Platelets: 299 10*3/uL (ref 150–400)
RBC: 4.24 MIL/uL (ref 3.87–5.11)
RDW: 20.2 % — ABNORMAL HIGH (ref 11.5–15.5)
WBC: 8.2 10*3/uL (ref 4.0–10.5)
nRBC: 0 % (ref 0.0–0.2)

## 2021-09-10 LAB — ETHANOL: Alcohol, Ethyl (B): <10 mg/dL (ref <10)

## 2021-09-10 MED ORDER — LORAZEPAM 0.5 MG PO TABS
0.5000 mg | ORAL_TABLET | Freq: Once | ORAL | Status: AC
  Administered 2021-09-10: 0.5 mg via ORAL
  Filled 2021-09-10: qty 1

## 2021-09-10 MED ORDER — POTASSIUM CHLORIDE CRYS ER 20 MEQ PO TBCR
40.0000 meq | EXTENDED_RELEASE_TABLET | Freq: Once | ORAL | Status: AC
Start: 2021-09-10 — End: 2021-09-10
  Administered 2021-09-10: 40 meq via ORAL
  Filled 2021-09-10: qty 2

## 2021-09-10 MED ORDER — ACETAMINOPHEN 325 MG PO TABS
650.0000 mg | ORAL_TABLET | Freq: Once | ORAL | Status: AC
Start: 2021-09-10 — End: 2021-09-10
  Administered 2021-09-10: 650 mg via ORAL
  Filled 2021-09-10: qty 2

## 2021-09-10 NOTE — ED Triage Notes (Signed)
CP that started this morning. Also reporting generalized pain all over ? ?22G IV LW ?1nitro ?324 aspirin with CP relief ? ?VS ?BP 171/89 ?O2 97 ?HR 78 ? ?

## 2021-09-10 NOTE — ED Provider Notes (Signed)
[facility] EMERGENCY DEPARTMENT Provider Note   CSN: [id number] Arrival date & time: 09/10/21  1115     History  Chief Complaint  Patient presents with   Chest Pain   Headache    Stacy Dalton is a 71 y.o. female.  Patient with hx etoh use disorder, gerd/esophagitis, c/o pain all over, including head and lower chest. Symptoms present for past week, dull, moderate, non radiating, not pleuritic. Patient is poor historian - level 5 caveat. Notes fall in past week - old bruising noted to forehead, dull frontal headache. Denies loc. No neck or back pain. No numbness/weakness. No change in speech or vision. Denies exertional cp or discomfort. No associated sob, nv or diaphoresis. Denies extremity pain or injury.   The history is provided by the patient and medical records. The history is limited by the condition of the patient.      Home Medications Prior to Admission medications   Medication Sig Start Date End Date Taking? Authorizing Provider  acetaminophen (TYLENOL) 500 MG tablet Take by mouth. 07/13/21   [provider]  albuterol (VENTOLIN HFA) 108 (90 Base) MCG/ACT inhaler Inhale 2 puffs into the lungs every 4 (four) hours as needed for wheezing or shortness of breath. 08/21/21   Orpah Greek, MD  amLODipine (NORVASC) 10 MG tablet Take 1 tablet (10 mg total) by mouth daily. 08/30/21   Orson Eva, MD  B Complex Vitamins (VITAMIN B COMPLEX) TABS Take 1 tablet by mouth daily.    [provider]  DULoxetine (CYMBALTA) 60 MG capsule Take 1 capsule (60 mg total) by mouth daily. 08/08/21   [name], FNP  famotidine (PEPCID) 20 MG tablet Take by mouth. 06/29/21   [provider]  ferrous sulfate 325 (65 FE) MG tablet Take by mouth.    [provider]  fluticasone-salmeterol (ADVAIR DISKUS) 250-50 MCG/ACT AEPB Inhale 1 puff into the lungs in the morning and at bedtime. Patient not taking: Reported on 05/25/2021 04/19/21 05/20/21   Deatra James, MD  folic acid (FOLVITE) 1 MG tablet Take 1 tablet (1 mg total) by mouth daily. 05/22/21   Regalado, Belkys A, MD  hydrOXYzine (VISTARIL) 25 MG capsule Take by mouth. 07/13/21   [provider]  levothyroxine (SYNTHROID) 50 MCG tablet Take 1 tablet (50 mcg total) by mouth daily before breakfast. 06/22/20   [name], FNP  levothyroxine (SYNTHROID) 50 MCG tablet Take by mouth. 02/19/21   [provider]  Lidocaine 4 % PTCH Place onto the skin. 02/19/21   [provider]  LORazepam (ATIVAN) 1 MG tablet Take by mouth. 06/29/21   [provider]  losartan (COZAAR) 25 MG tablet Take 1 tablet by mouth daily. 07/13/21   [provider]  MAGNESIUM-OXIDE 400 (240 Mg) MG tablet Take 1 tablet by mouth daily. 05/24/21   [provider]  metoprolol tartrate (LOPRESSOR) 25 MG tablet Take by mouth.    [provider]  Multiple Vitamin (MULTIVITAMIN WITH MINERALS) TABS tablet Take 1 tablet by mouth daily. 05/22/21   Regalado, Belkys A, MD  ondansetron (ZOFRAN) 4 MG tablet Take 1 tablet (4 mg total) by mouth every 8 (eight) hours as needed for nausea or vomiting. 08/08/21   Evelina Dun A, FNP  pantoprazole (PROTONIX) 20 MG tablet Take 20 mg by mouth daily. 06/29/21   [provider]  pantoprazole (PROTONIX) 40 MG tablet Take 1 tablet (40 mg total) by mouth 2 (two) times daily. 08/29/21  Orson Eva, MD  potassium chloride (KLOR-CON) 10 MEQ tablet Take 10 mEq by mouth 2 (two) times daily. 06/29/21   [provider]  predniSONE (STERAPRED UNI-PAK 21 TAB) 10 MG (21) TBPK tablet  02/23/21   [provider]  sertraline (ZOLOFT) 50 MG tablet Take by mouth.    [provider]  sucralfate (CARAFATE) 1 GM/10ML suspension Take 10 mLs (1 g total) by mouth 4 (four) times daily -  with meals and at bedtime. 08/29/21   Orson Eva, MD  temazepam (RESTORIL) 15 MG capsule Take by mouth.    [provider]   thiamine 100 MG tablet Take 1 tablet (100 mg total) by mouth daily. 05/22/21   Regalado, Belkys A, MD  thiamine 100 MG tablet Take 1 tablet by mouth daily. 02/20/21   [provider]  vitamin B-12 (CYANOCOBALAMIN) 1000 MCG tablet Take 1 tablet (1,000 mcg total) by mouth daily. 05/21/21   Regalado, Belkys A, MD  Vitamin D, Ergocalciferol, (DRISDOL) 1.25 MG (50000 UNIT) CAPS capsule Take 1 capsule (50,000 Units total) by mouth every 7 (seven) days. 06/22/20   [name], FNP      Allergies    Librium [chlordiazepoxide], Toradol [ketorolac tromethamine], Butalbital-apap-caffeine, Demerol, Esgic [butalbital-apap-caffeine], Iodine, and Pyridium [phenazopyridine]    Review of Systems   Review of Systems  Constitutional:  Negative for chills and fever.  HENT:  Negative for sore throat.   Eyes:  Negative for pain and visual disturbance.  Respiratory:  Negative for cough and shortness of breath.   Cardiovascular:  Positive for chest pain. Negative for palpitations and leg swelling.  Gastrointestinal:  Positive for nausea. Negative for abdominal pain and vomiting.  Genitourinary:  Negative for dysuria and flank pain.  Musculoskeletal:  Negative for back pain and neck pain.  Skin:  Negative for rash.  Neurological:  Positive for headaches. Negative for syncope, speech difficulty, weakness and numbness.  Hematological:  Does not bruise/bleed easily.  Psychiatric/Behavioral:  Negative for agitation.    Physical Exam Updated Vital Signs BP (!) 174/164    Pulse 94    Temp 99.2 F (37.3 C) (Oral)    Resp (!) 24    Ht 1.549 m ('5\' 1"'$ )    Wt 53.5 kg    SpO2 100%    BMI 22.30 kg/m  Physical Exam Vitals and nursing note reviewed.  Constitutional:      Appearance: Normal appearance. She is well-developed.  HENT:     Head: Atraumatic.     Comments: Old/yellowish bruising to forehead.     Nose: Nose normal.     Mouth/Throat:     Mouth: Mucous membranes are moist.  Eyes:     General:  No scleral icterus.    Conjunctiva/sclera: Conjunctivae normal.     Pupils: Pupils are equal, round, and reactive to light.  Neck:     Vascular: No carotid bruit.     Trachea: No tracheal deviation.     Comments: No stiffness or rigidity. Trachea midline. No mass.  Cardiovascular:     Rate and Rhythm: Normal rate and regular rhythm.     Pulses: Normal pulses.     Heart sounds: Normal heart sounds. No murmur heard.   No friction rub. No gallop.  Pulmonary:     Effort: Pulmonary effort is normal. No respiratory distress.     Breath sounds: Normal breath sounds.     Comments: Normal chest movement, no crepitus. Mild anterior chest wall tenderness.  Chest:  Chest wall: Tenderness present.  Abdominal:     General: Bowel sounds are normal. There is no distension.     Palpations: Abdomen is soft.     Tenderness: There is no abdominal tenderness. There is no guarding.     Comments: No abd bruising or contusion.   Genitourinary:    Comments: No cva tenderness.  Musculoskeletal:        General: No swelling or tenderness.     Cervical back: Normal range of motion and neck supple. No rigidity or tenderness. No muscular tenderness.     Right lower leg: No edema.     Left lower leg: No edema.     Comments: CTLS spine, non tender, aligned, no step off. Good rom bil extremities without pain or focal bony tenderness.   Skin:    General: Skin is warm and dry.     Findings: No rash.  Neurological:     Mental Status: She is alert.     Comments: Alert, speech normal. Motor/sens grossly intact bil.   Psychiatric:        Mood and Affect: Mood normal.    ED Results / Procedures / Treatments   Labs (all labs ordered are listed, but only abnormal results are displayed) Results for orders placed or performed during the hospital encounter of 09/10/21  CBC  Result Value Ref Range   WBC 8.2 4.0 - 10.5 K/uL   RBC 4.24 3.87 - 5.11 MIL/uL   Hemoglobin 11.1 (L) 12.0 - 15.0 g/dL   HCT 34.7 (L)  36.0 - 46.0 %   MCV 81.8 80.0 - 100.0 fL   MCH 26.2 26.0 - 34.0 pg   MCHC 32.0 30.0 - 36.0 g/dL   RDW 20.2 (H) 11.5 - 15.5 %   Platelets 299 150 - 400 K/uL   nRBC 0.0 0.0 - 0.2 %  Comprehensive metabolic panel  Result Value Ref Range   Sodium 137 135 - 145 mmol/L   Potassium 3.4 (L) 3.5 - 5.1 mmol/L   Chloride 98 98 - 111 mmol/L   CO2 24 22 - 32 mmol/L   Glucose, Bld 128 (H) 70 - 99 mg/dL   BUN 24 (H) 8 - 23 mg/dL   Creatinine, Ser 0.78 0.44 - 1.00 mg/dL   Calcium 9.2 8.9 - 10.3 mg/dL   Total Protein 7.1 6.5 - 8.1 g/dL   Albumin 4.1 3.5 - 5.0 g/dL   AST 43 (H) 15 - 41 U/L   ALT 29 0 - 44 U/L   Alkaline Phosphatase 92 38 - 126 U/L   Total Bilirubin 1.5 (H) 0.3 - 1.2 mg/dL   GFR, Estimated >60 >60 mL/min   Anion gap 15 5 - 15  Ethanol  Result Value Ref Range   Alcohol, Ethyl (B) <10 <10 mg/dL  Troponin I (High Sensitivity)  Result Value Ref Range   Troponin I (High Sensitivity) 12 <18 ng/L   CT Head Wo Contrast  Result Date: 09/10/2021 CLINICAL DATA:  Head trauma, minor (Age >= 65y) EXAM: CT HEAD WITHOUT CONTRAST TECHNIQUE: Contiguous axial images were obtained from the base of the skull through the vertex without intravenous contrast. RADIATION DOSE REDUCTION: This exam was performed according to the departmental dose-optimization program which includes automated exposure control, adjustment of the mA and/or kV according to patient size and/or use of iterative reconstruction technique. COMPARISON:  06/06/2021 FINDINGS: Brain: No evidence of acute infarction, hemorrhage, hydrocephalus, extra-axial collection or mass lesion/mass effect. Scattered low-density changes within the periventricular and  subcortical white matter compatible with chronic microvascular ischemic change. Mild diffuse cerebral volume loss. Vascular: No hyperdense vessel or unexpected calcification. Skull: Normal. Negative for fracture or focal lesion. Sinuses/Orbits: Partial bilateral mastoid effusions, new from  prior. Paranasal sinuses are clear. Other: None. IMPRESSION: 1. No acute intracranial abnormality. 2. Mild chronic microvascular ischemic change and cerebral volume loss. 3. Partial bilateral mastoid effusions, new from prior. Electronically Signed   By: Davina Poke D.O.   On: 09/10/2021 12:36   CT Angio Chest Pulmonary Embolism (PE) W or WO Contrast  Result Date: 08/21/2021 CLINICAL DATA:  71 year old female with chest pain. EXAM: CT ANGIOGRAPHY CHEST WITH CONTRAST TECHNIQUE: Multidetector CT imaging of the chest was performed using the standard protocol during bolus administration of intravenous contrast. Multiplanar CT image reconstructions and MIPs were obtained to evaluate the vascular anatomy. RADIATION DOSE REDUCTION: This exam was performed according to the departmental dose-optimization program which includes automated exposure control, adjustment of the mA and/or kV according to patient size and/or use of iterative reconstruction technique. CONTRAST:  46m OMNIPAQUE IOHEXOL 350 MG/ML SOLN COMPARISON:  Portable chest 08/20/2021. Chest CT 02/28/2021. FINDINGS: Cardiovascular: Good contrast bolus timing in the pulmonary arterial tree. Mild respiratory motion at the lung bases. No focal filling defect identified in the pulmonary arteries to suggest acute pulmonary embolism. Extensive Calcified aortic atherosclerosis. 4 vessel arch configuration (normal variant). Calcified coronary artery atherosclerosis. Stable heart size, no significant cardiomegaly. No pericardial effusion. Mediastinum/Nodes: No mediastinal mass or lymphadenopathy. But there is abnormal circumferential wall thickening of the thoracic esophagus which appears indistinct (series 5, image 288). Lungs/Pleura: Lower lung volumes and atelectatic changes to the major airways which remain patent. Linear chronic scarring in the anteromedial right upper lobe. Minor atelectasis. No pleural effusion or other abnormal pulmonary opacity. Upper  Abdomen: Grossly negative visible liver, spleen, pancreas, adrenal glands, left kidney and bowel in the upper abdomen. Musculoskeletal: No acute osseous abnormality identified. Review of the MIP images confirms the above findings. IMPRESSION: 1. Negative for acute pulmonary embolus. 2. Evidence of Acute Esophagitis, with abnormal circumferential mural thickening most pronounced in the distal esophagus. 3. No other acute finding in the chest. 4. Calcified coronary artery and Aortic Atherosclerosis (ICD10-I70.0). Electronically Signed   By: HGenevie AnnM.D.   On: 08/21/2021 05:15   CT ABDOMEN PELVIS W CONTRAST  Result Date: 08/27/2021 CLINICAL DATA:  Nausea and vomiting EXAM: CT ABDOMEN AND PELVIS WITH CONTRAST TECHNIQUE: Multidetector CT imaging of the abdomen and pelvis was performed using the standard protocol following bolus administration of intravenous contrast. RADIATION DOSE REDUCTION: This exam was performed according to the departmental dose-optimization program which includes automated exposure control, adjustment of the mA and/or kV according to patient size and/or use of iterative reconstruction technique. CONTRAST:  1061mOMNIPAQUE IOHEXOL 300 MG/ML  SOLN COMPARISON:  06/23/2021 FINDINGS: Lower chest: Lung bases are clear. Hepatobiliary: No focal hepatic lesion. No biliary duct dilatation. Common bile duct is normal. Pancreas: Pancreas is normal. No ductal dilatation. No pancreatic inflammation. Spleen: Normal spleen Adrenals/urinary tract: Adrenal glands and kidneys are normal. The ureters and bladder normal. Stomach/Bowel: Stomach, small-bowel and cecum are normal. The appendix is not identified but there is no pericecal inflammation to suggest appendicitis. Moderate volume stool LEFT colon and rectosigmoid colon. Surgical anastomosis in the sigmoid colon. Vascular/Lymphatic: Abdominal aorta is normal caliber with atherosclerotic calcification. There is no retroperitoneal or periportal lymphadenopathy.  No pelvic lymphadenopathy. Reproductive: Post hysterectomy.  Adnexa unremarkable Other: No free fluid. Musculoskeletal: No aggressive osseous  lesion. IMPRESSION: 1. No acute findings in the abdomen pelvis. 2. Thickening the distal esophagus similar prior. 3. Moderate volume stool in the sigmoid colon and rectum. Surgical anastomosis in the sigmoid colon. Electronically Signed   By: Suzy Bouchard M.D.   On: 08/27/2021 19:16   DG Chest Port 1 View  Result Date: 09/10/2021 CLINICAL DATA:  Pain EXAM: PORTABLE CHEST 1 VIEW COMPARISON:  08/21/2021 FINDINGS: The heart size and mediastinal contours are within normal limits. Atherosclerotic calcification of the aortic knob. No focal airspace consolidation, pleural effusion, or pneumothorax. The visualized skeletal structures are unremarkable. IMPRESSION: No active disease. Electronically Signed   By: Davina Poke D.O.   On: 09/10/2021 12:33   DG Chest Portable 1 View  Result Date: 08/21/2021 CLINICAL DATA:  Shortness of breath. EXAM: PORTABLE CHEST 1 VIEW COMPARISON:  Radiograph yesterday, chest CTA earlier today. FINDINGS: Stable heart size and mediastinal contours. Aortic atherosclerosis. Mild chronic interstitial coarsening without acute airspace disease. No pleural effusion, pneumothorax, or pulmonary edema. Stable osseous structures. IMPRESSION: No acute radiographic findings. Electronically Signed   By: Keith Rake M.D.   On: 08/21/2021 20:28   DG Chest Port 1 View  Result Date: 08/20/2021 CLINICAL DATA:  Chest pain and shortness of breath. EXAM: PORTABLE CHEST 1 VIEW COMPARISON:  Radiograph 06/28/2021, chest CT 08/19/2020 FINDINGS: Stable heart size and mediastinal contours. Aortic atherosclerosis. Chronic interstitial coarsening without confluent consolidation no pulmonary edema, pleural effusion, or pneumothorax. No acute osseous abnormalities are seen. IMPRESSION: No acute chest findings. Electronically Signed   By: Keith Rake M.D.    On: 08/20/2021 23:28   ECHOCARDIOGRAM COMPLETE  Result Date: 08/29/2021    ECHOCARDIOGRAM REPORT   Patient Name:   Stacy Dalton Date of Exam: 08/29/2021 Medical Rec #:  [id number]         Height:       61.0 in Accession #:    [id number]        Weight:       120.4 lb Date of Birth:  05-03-1951         BSA:          1.522 m Patient Age:    11 years          BP:           164/90 mmHg Patient Gender: F                 HR:           94 bpm. Exam Location:  [facility] Procedure: 2D Echo, 3D Echo, Cardiac Doppler and Color Doppler Indications:    R07.9* Chest pain, unspecified  History:        Patient has prior history of Echocardiogram examinations, most                 recent 12/19/2016. COPD, Signs/Symptoms:Chest Pain and Altered                 Mental Status; Risk Factors:Hypertension and Dyslipidemia. ETOH.                 Opiate use.  Sonographer:    Roseanna Rainbow RDCS Referring Phys: [phone] DAVID TAT IMPRESSIONS  1. Left ventricular ejection fraction, by estimation, is 60 to 65%. The left ventricle has normal function. The left ventricle has no regional wall motion abnormalities. There is mild left ventricular hypertrophy. Left ventricular diastolic parameters are consistent with Grade I diastolic dysfunction (impaired relaxation).  2. Right ventricular systolic  function is normal. The right ventricular size is normal.  3. The mitral valve is normal in structure. No evidence of mitral valve regurgitation. No evidence of mitral stenosis.  4. The aortic valve was not well visualized. There is mild calcification of the aortic valve. There is mild thickening of the aortic valve. Aortic valve regurgitation is not visualized. No aortic stenosis is present.  5. The inferior vena cava is normal in size with greater than 50% respiratory variability, suggesting right atrial pressure of 3 mmHg. FINDINGS  Left Ventricle: Left ventricular ejection fraction, by estimation, is 60 to 65%. The left ventricle has normal function. The  left ventricle has no regional wall motion abnormalities. The left ventricular internal cavity size was normal in size. There is  mild left ventricular hypertrophy. Left ventricular diastolic parameters are consistent with Grade I diastolic dysfunction (impaired relaxation). Normal left ventricular filling pressure. Right Ventricle: The right ventricular size is normal. Right vetricular wall thickness was not well visualized. Right ventricular systolic function is normal. Left Atrium: Left atrial size was not well visualized. Right Atrium: Right atrial size was not well visualized. Pericardium: There is no evidence of pericardial effusion. Mitral Valve: The mitral valve is normal in structure. No evidence of mitral valve regurgitation. No evidence of mitral valve stenosis. Tricuspid Valve: The tricuspid valve is not well visualized. Tricuspid valve regurgitation is not demonstrated. No evidence of tricuspid stenosis. Aortic Valve: The aortic valve was not well visualized. There is mild calcification of the aortic valve. There is mild thickening of the aortic valve. There is mild aortic valve annular calcification. Aortic valve regurgitation is not visualized. No aortic stenosis is present. Aortic valve mean gradient measures 6.7 mmHg. Aortic valve peak gradient measures 11.7 mmHg. Aortic valve area, by VTI measures 1.85 cm. Pulmonic Valve: The pulmonic valve was not well visualized. Pulmonic valve regurgitation is not visualized. No evidence of pulmonic stenosis. Aorta: The aortic root is normal in size and structure. Venous: The inferior vena cava is normal in size with greater than 50% respiratory variability, suggesting right atrial pressure of 3 mmHg. IAS/Shunts: No atrial level shunt detected by color flow Doppler.  LEFT VENTRICLE PLAX 2D LVIDd:         3.60 cm     Diastology LVIDs:         2.30 cm     LV e' medial:    5.66 cm/s LV PW:         1.20 cm     LV E/e' medial:  15.2 LV IVS:        1.10 cm     LV e'  lateral:   7.72 cm/s LVOT diam:     1.90 cm     LV E/e' lateral: 11.1 LV SV:         57 LV SV Index:   37 LVOT Area:     2.84 cm                             3D Volume EF: LV Volumes (MOD)           3D EF:        63 % LV vol d, MOD A2C: 61.4 ml LV EDV:       71 ml LV vol d, MOD A4C: 55.7 ml LV ESV:       26 ml LV vol s, MOD A2C: 21.9 ml LV SV:  45 ml LV vol s, MOD A4C: 21.5 ml LV SV MOD A2C:     39.5 ml LV SV MOD A4C:     55.7 ml LV SV MOD BP:      39.0 ml RIGHT VENTRICLE             IVC RV S prime:     14.80 cm/s  IVC diam: 1.20 cm TAPSE (M-mode): 1.9 cm LEFT ATRIUM             Index       RIGHT ATRIUM          Index LA diam:        2.50 cm 1.64 cm/m  RA Area:     8.40 cm LA Vol (A2C):   14.3 ml 9.39 ml/m  RA Volume:   12.50 ml 8.21 ml/m LA Vol (A4C):   8.5 ml  5.60 ml/m LA Biplane Vol: 11.1 ml 7.29 ml/m  AORTIC VALVE AV Area (Vmax):    1.92 cm AV Area (Vmean):   1.81 cm AV Area (VTI):     1.85 cm AV Vmax:           171.33 cm/s AV Vmean:          118.000 cm/s AV VTI:            0.308 m AV Peak Grad:      11.7 mmHg AV Mean Grad:      6.7 mmHg LVOT Vmax:         116.00 cm/s LVOT Vmean:        75.500 cm/s LVOT VTI:          0.201 m LVOT/AV VTI ratio: 0.65  AORTA Ao Root diam: 2.80 cm Ao Asc diam:  2.90 cm MITRAL VALVE MV Area (PHT): 3.85 cm     SHUNTS MV Decel Time: 197 msec     Systemic VTI:  0.20 m MV E velocity: 85.90 cm/s   Systemic Diam: 1.90 cm MV A velocity: 121.00 cm/s MV E/A ratio:  0.71 Carlyle Dolly MD Electronically signed by Carlyle Dolly MD Signature Date/Time: 08/29/2021/10:51:17 AM    Final      EKG EKG Interpretation  Date/Time:  Saturday September 10 2021 11:28:35 EST Ventricular Rate:  71 PR Interval:  122 QRS Duration: 87 QT Interval:  434 QTC Calculation: 472 R Axis:   63 Text Interpretation: Sinus rhythm Baseline wander Confirmed by Lajean Saver [phone]) on 09/10/2021 11:33:13 AM  Radiology CT Head Wo Contrast  Result Date: 09/10/2021 CLINICAL DATA:  Head trauma,  minor (Age >= 65y) EXAM: CT HEAD WITHOUT CONTRAST TECHNIQUE: Contiguous axial images were obtained from the base of the skull through the vertex without intravenous contrast. RADIATION DOSE REDUCTION: This exam was performed according to the departmental dose-optimization program which includes automated exposure control, adjustment of the mA and/or kV according to patient size and/or use of iterative reconstruction technique. COMPARISON:  06/06/2021 FINDINGS: Brain: No evidence of acute infarction, hemorrhage, hydrocephalus, extra-axial collection or mass lesion/mass effect. Scattered low-density changes within the periventricular and subcortical white matter compatible with chronic microvascular ischemic change. Mild diffuse cerebral volume loss. Vascular: No hyperdense vessel or unexpected calcification. Skull: Normal. Negative for fracture or focal lesion. Sinuses/Orbits: Partial bilateral mastoid effusions, new from prior. Paranasal sinuses are clear. Other: None. IMPRESSION: 1. No acute intracranial abnormality. 2. Mild chronic microvascular ischemic change and cerebral volume loss. 3. Partial bilateral mastoid effusions, new from prior. Electronically Signed   By: Hart Carwin  Plundo D.O.   On: 09/10/2021 12:36   DG Chest Port 1 View  Result Date: 09/10/2021 CLINICAL DATA:  Pain EXAM: PORTABLE CHEST 1 VIEW COMPARISON:  08/21/2021 FINDINGS: The heart size and mediastinal contours are within normal limits. Atherosclerotic calcification of the aortic knob. No focal airspace consolidation, pleural effusion, or pneumothorax. The visualized skeletal structures are unremarkable. IMPRESSION: No active disease. Electronically Signed   By: Davina Poke D.O.   On: 09/10/2021 12:33    Procedures Procedures    Medications Ordered in ED Medications  acetaminophen (TYLENOL) tablet 650 mg (650 mg Oral Given 09/10/21 1209)  LORazepam (ATIVAN) tablet 0.5 mg (0.5 mg Oral Given 09/10/21 1340)    ED Course/ Medical  Decision Making/ A&P                           Medical Decision Making Problems Addressed: Esophagitis: chronic illness or injury with exacerbation, progression, or side effects of treatment Essential hypertension: chronic illness or injury with exacerbation, progression, or side effects of treatment that poses a threat to life or bodily functions Frontal headache: acute illness or injury History of alcohol use disorder: chronic illness or injury that poses a threat to life or bodily functions Precordial chest pain: acute illness or injury with systemic symptoms that poses a threat to life or bodily functions  Amount and/or Complexity of Data Reviewed External Data Reviewed: labs, radiology and notes. Labs: ordered. Decision-making details documented in ED Course. Radiology: ordered and independent interpretation performed. Decision-making details documented in ED Course. ECG/medicine tests: ordered and independent interpretation performed. Decision-making details documented in ED Course.  Risk OTC drugs. Prescription drug management. Decision regarding hospitalization.   Iv ns. Continuous pulse ox and cardiac monitoring. Labs ordered/sent. Imaging ordered.  Considered disposition incl possible admission, given cp c/o. Will get labs/imaging and reassess dispo plan.   Reviewed nursing notes and prior charts for additional history. External reports reviewed. Recent egd/workup  - esophagitis noted.   Cardiac monitor: sinus rhythm, rate 76.  Labs reviewed/interpreted by me - trop normal. Wbc normal. K mildly low, kcl po.   Xrays reviewed/interpreted by me - no pna.   CT reviewed/interpreted by me -  no hem.   Additional labs reviewed/interpreted by me - etoh neg. Hx etoh use disorder. Mildly shaky, ativan po. Po fluids/food.  Recheck pt, appears comfortable. No acute distress. No cp or sob.   Rec close pcp f/u.  Return precautions provided.           Final Clinical  Impression(s) / ED Diagnoses Final diagnoses:  None    Rx / DC Orders ED Discharge Orders     None         Lajean Saver, MD 09/10/21 1437

## 2021-09-10 NOTE — Discharge Instructions (Addendum)
It was our pleasure to provide your ER care today - we hope that you feel better. ? ?Drink plenty of fluids/stay well hydrated.  ? ?Take acetaminophen as need.  ? ?Continue carafate and protonix. If GI/reflux symptoms, you may also try taking pepcid and maalox as need for symptom relief. ? ?Follow up with your doctor in the coming week -  call office Monday to arrange follow up appointment.  Your blood pressure is high today - also follow up regarding your blood pressure then.  ? ?Return to ER if worse, new symptoms, increased trouble breathing, new/severe pain, or other concern.  ?

## 2021-09-10 NOTE — ED Notes (Signed)
Purewick and brief placed on pt. Warm blanket given ?

## 2021-09-10 NOTE — ED Notes (Signed)
Pt family member on the way to pick patient up. Be here in 30 minutes ?

## 2021-09-13 DIAGNOSIS — Z20822 Contact with and (suspected) exposure to covid-19: Secondary | ICD-10-CM | POA: Diagnosis not present

## 2021-09-14 DIAGNOSIS — Z20822 Contact with and (suspected) exposure to covid-19: Secondary | ICD-10-CM | POA: Diagnosis not present

## 2021-09-14 DIAGNOSIS — J80 Acute respiratory distress syndrome: Secondary | ICD-10-CM | POA: Diagnosis not present

## 2021-09-14 DIAGNOSIS — R5381 Other malaise: Secondary | ICD-10-CM | POA: Diagnosis not present

## 2021-09-14 DIAGNOSIS — I10 Essential (primary) hypertension: Secondary | ICD-10-CM | POA: Diagnosis not present

## 2021-09-14 DIAGNOSIS — R0602 Shortness of breath: Secondary | ICD-10-CM | POA: Diagnosis not present

## 2021-09-16 DIAGNOSIS — R69 Illness, unspecified: Secondary | ICD-10-CM | POA: Diagnosis not present

## 2021-09-16 DIAGNOSIS — R5381 Other malaise: Secondary | ICD-10-CM | POA: Diagnosis not present

## 2021-09-17 ENCOUNTER — Inpatient Hospital Stay (HOSPITAL_COMMUNITY)
Admission: EM | Admit: 2021-09-17 | Discharge: 2021-09-23 | DRG: 896 | Disposition: A | Discharge status: Skilled Nursing Facility | Payer: Medicare Other | Attending: Internal Medicine | Admitting: Internal Medicine

## 2021-09-17 ENCOUNTER — Encounter (HOSPITAL_COMMUNITY): Payer: Self-pay | Admitting: Emergency Medicine

## 2021-09-17 ENCOUNTER — Emergency Department (HOSPITAL_COMMUNITY): Payer: Medicare Other

## 2021-09-17 DIAGNOSIS — R079 Chest pain, unspecified: Secondary | ICD-10-CM | POA: Diagnosis not present

## 2021-09-17 DIAGNOSIS — R64 Cachexia: Secondary | ICD-10-CM | POA: Diagnosis present

## 2021-09-17 DIAGNOSIS — J449 Chronic obstructive pulmonary disease, unspecified: Secondary | ICD-10-CM | POA: Diagnosis not present

## 2021-09-17 DIAGNOSIS — E039 Hypothyroidism, unspecified: Secondary | ICD-10-CM | POA: Diagnosis present

## 2021-09-17 DIAGNOSIS — Z87891 Personal history of nicotine dependence: Secondary | ICD-10-CM

## 2021-09-17 DIAGNOSIS — Z833 Family history of diabetes mellitus: Secondary | ICD-10-CM

## 2021-09-17 DIAGNOSIS — I10 Essential (primary) hypertension: Secondary | ICD-10-CM | POA: Diagnosis present

## 2021-09-17 DIAGNOSIS — R42 Dizziness and giddiness: Secondary | ICD-10-CM | POA: Diagnosis not present

## 2021-09-17 DIAGNOSIS — Z90710 Acquired absence of both cervix and uterus: Secondary | ICD-10-CM | POA: Diagnosis not present

## 2021-09-17 DIAGNOSIS — E8729 Other acidosis: Secondary | ICD-10-CM | POA: Diagnosis present

## 2021-09-17 DIAGNOSIS — F10239 Alcohol dependence with withdrawal, unspecified: Principal | ICD-10-CM | POA: Diagnosis present

## 2021-09-17 DIAGNOSIS — R569 Unspecified convulsions: Secondary | ICD-10-CM | POA: Diagnosis not present

## 2021-09-17 DIAGNOSIS — F410 Panic disorder [episodic paroxysmal anxiety] without agoraphobia: Secondary | ICD-10-CM | POA: Diagnosis present

## 2021-09-17 DIAGNOSIS — F10939 Alcohol use, unspecified with withdrawal, unspecified: Principal | ICD-10-CM

## 2021-09-17 DIAGNOSIS — Z79890 Hormone replacement therapy (postmenopausal): Secondary | ICD-10-CM | POA: Diagnosis not present

## 2021-09-17 DIAGNOSIS — F10931 Alcohol use, unspecified with withdrawal delirium: Secondary | ICD-10-CM | POA: Diagnosis not present

## 2021-09-17 DIAGNOSIS — F10930 Alcohol use, unspecified with withdrawal, uncomplicated: Secondary | ICD-10-CM | POA: Diagnosis not present

## 2021-09-17 DIAGNOSIS — Z823 Family history of stroke: Secondary | ICD-10-CM

## 2021-09-17 DIAGNOSIS — Z79899 Other long term (current) drug therapy: Secondary | ICD-10-CM

## 2021-09-17 DIAGNOSIS — Z20822 Contact with and (suspected) exposure to covid-19: Secondary | ICD-10-CM | POA: Diagnosis present

## 2021-09-17 DIAGNOSIS — F1120 Opioid dependence, uncomplicated: Secondary | ICD-10-CM | POA: Diagnosis present

## 2021-09-17 DIAGNOSIS — R0602 Shortness of breath: Secondary | ICD-10-CM | POA: Diagnosis not present

## 2021-09-17 DIAGNOSIS — E876 Hypokalemia: Secondary | ICD-10-CM | POA: Diagnosis present

## 2021-09-17 DIAGNOSIS — Z8349 Family history of other endocrine, nutritional and metabolic diseases: Secondary | ICD-10-CM

## 2021-09-17 DIAGNOSIS — J690 Pneumonitis due to inhalation of food and vomit: Secondary | ICD-10-CM | POA: Diagnosis not present

## 2021-09-17 DIAGNOSIS — Z825 Family history of asthma and other chronic lower respiratory diseases: Secondary | ICD-10-CM | POA: Diagnosis not present

## 2021-09-17 DIAGNOSIS — Z800 Family history of malignant neoplasm of digestive organs: Secondary | ICD-10-CM | POA: Diagnosis not present

## 2021-09-17 DIAGNOSIS — K2100 Gastro-esophageal reflux disease with esophagitis, without bleeding: Secondary | ICD-10-CM | POA: Diagnosis present

## 2021-09-17 DIAGNOSIS — R000 Tachycardia, unspecified: Secondary | ICD-10-CM | POA: Diagnosis not present

## 2021-09-17 DIAGNOSIS — G9341 Metabolic encephalopathy: Secondary | ICD-10-CM | POA: Diagnosis not present

## 2021-09-17 DIAGNOSIS — R0789 Other chest pain: Secondary | ICD-10-CM | POA: Diagnosis present

## 2021-09-17 DIAGNOSIS — K209 Esophagitis, unspecified without bleeding: Secondary | ICD-10-CM | POA: Diagnosis not present

## 2021-09-17 DIAGNOSIS — G4089 Other seizures: Secondary | ICD-10-CM | POA: Diagnosis present

## 2021-09-17 DIAGNOSIS — I517 Cardiomegaly: Secondary | ICD-10-CM | POA: Diagnosis not present

## 2021-09-17 DIAGNOSIS — J9601 Acute respiratory failure with hypoxia: Secondary | ICD-10-CM | POA: Diagnosis not present

## 2021-09-17 DIAGNOSIS — E038 Other specified hypothyroidism: Secondary | ICD-10-CM | POA: Diagnosis not present

## 2021-09-17 DIAGNOSIS — Z8249 Family history of ischemic heart disease and other diseases of the circulatory system: Secondary | ICD-10-CM | POA: Diagnosis not present

## 2021-09-17 DIAGNOSIS — Z6822 Body mass index (BMI) 22.0-22.9, adult: Secondary | ICD-10-CM

## 2021-09-17 LAB — CBC WITH DIFFERENTIAL/PLATELET
Abs Immature Granulocytes: 0.07 10*3/uL (ref 0.00–0.07)
Basophils Absolute: 0 10*3/uL (ref 0.0–0.1)
Basophils Relative: 0 %
Eosinophils Absolute: 0 10*3/uL (ref 0.0–0.5)
Eosinophils Relative: 1 %
HCT: 35.2 % — ABNORMAL LOW (ref 36.0–46.0)
Hemoglobin: 11 g/dL — ABNORMAL LOW (ref 12.0–15.0)
Immature Granulocytes: 1 %
Lymphocytes Relative: 23 %
Lymphs Abs: 1.5 10*3/uL (ref 0.7–4.0)
MCH: 26.7 pg (ref 26.0–34.0)
MCHC: 31.3 g/dL (ref 30.0–36.0)
MCV: 85.4 fL (ref 80.0–100.0)
Monocytes Absolute: 0.6 10*3/uL (ref 0.1–1.0)
Monocytes Relative: 9 %
Neutro Abs: 4.2 10*3/uL (ref 1.7–7.7)
Neutrophils Relative %: 66 %
Platelets: 191 10*3/uL (ref 150–400)
RBC: 4.12 MIL/uL (ref 3.87–5.11)
RDW: 19.4 % — ABNORMAL HIGH (ref 11.5–15.5)
WBC: 6.4 10*3/uL (ref 4.0–10.5)
nRBC: 0 % (ref 0.0–0.2)

## 2021-09-17 LAB — COMPREHENSIVE METABOLIC PANEL
ALT: 25 U/L (ref 0–44)
AST: 51 U/L — ABNORMAL HIGH (ref 15–41)
Albumin: 3.6 g/dL (ref 3.5–5.0)
Alkaline Phosphatase: 89 U/L (ref 38–126)
Anion gap: 20 — ABNORMAL HIGH (ref 5–15)
BUN: 13 mg/dL (ref 8–23)
CO2: 14 mmol/L — ABNORMAL LOW (ref 22–32)
Calcium: 7.5 mg/dL — ABNORMAL LOW (ref 8.9–10.3)
Chloride: 106 mmol/L (ref 98–111)
Creatinine, Ser: 0.88 mg/dL (ref 0.44–1.00)
GFR, Estimated: >60 mL/min (ref >60)
Glucose, Bld: 173 mg/dL — ABNORMAL HIGH (ref 70–99)
Potassium: 2.9 mmol/L — ABNORMAL LOW (ref 3.5–5.1)
Sodium: 140 mmol/L (ref 135–145)
Total Bilirubin: 0.5 mg/dL (ref 0.3–1.2)
Total Protein: 6.8 g/dL (ref 6.5–8.1)

## 2021-09-17 LAB — ETHANOL: Alcohol, Ethyl (B): <10 mg/dL (ref <10)

## 2021-09-17 MED ORDER — LORAZEPAM 2 MG/ML IJ SOLN
INTRAMUSCULAR | Status: AC
  Administered 2021-09-17: 1 mg via INTRAVENOUS
  Filled 2021-09-17: qty 1

## 2021-09-17 MED ORDER — METOPROLOL TARTRATE 5 MG/5ML IV SOLN
5.0000 mg | Freq: Once | INTRAVENOUS | Status: AC
  Administered 2021-09-17: 5 mg via INTRAVENOUS
  Filled 2021-09-17: qty 5

## 2021-09-17 MED ORDER — LORAZEPAM 2 MG/ML IJ SOLN
1.0000 mg | Freq: Once | INTRAMUSCULAR | Status: AC
  Administered 2021-09-17: 1 mg via INTRAVENOUS
  Filled 2021-09-17: qty 1

## 2021-09-17 MED ORDER — SODIUM CHLORIDE 0.9 % IV BOLUS
1000.0000 mL | Freq: Once | INTRAVENOUS | Status: AC
  Administered 2021-09-17: 1000 mL via INTRAVENOUS

## 2021-09-17 MED ORDER — LEVETIRACETAM IN NACL 1000 MG/100ML IV SOLN
1000.0000 mg | Freq: Once | INTRAVENOUS | Status: AC
  Administered 2021-09-17: 1000 mg via INTRAVENOUS
  Filled 2021-09-17: qty 100

## 2021-09-17 MED ORDER — POTASSIUM CHLORIDE 10 MEQ/100ML IV SOLN
10.0000 meq | Freq: Once | INTRAVENOUS | Status: AC
  Administered 2021-09-17: 10 meq via INTRAVENOUS
  Filled 2021-09-17: qty 100

## 2021-09-17 MED ORDER — LORAZEPAM 2 MG/ML IJ SOLN: 1.0000 mg | Freq: Once | INTRAMUSCULAR | Status: AC

## 2021-09-17 NOTE — ED Provider Notes (Signed)
?[facility] ?Provider Note ? ? ?CSN: [id number] ?Arrival date & time: 09/17/21  1934 ? ?  ? ?History ? ?Chief Complaint  ?Patient presents with  ? Alcohol Problem  ? ? ?Stacy Dalton is a 71 y.o. female. ? ?Patient drinks significant alcohol.  Her son took it away from her yesterday and she had no alcohol today.  Patient was complaining of anxiety.  When the paramedics arrived she was tachycardic and hypertensive.  Past medical history GERD EtOH abuse ? ?The history is provided by the patient and medical records. No language interpreter was used.  ?Alcohol Problem ?This is a recurrent problem. The current episode started more than 2 days ago. The problem occurs constantly. The problem has not changed since onset.Pertinent negatives include no chest pain, no abdominal pain and no headaches. Nothing aggravates the symptoms. Nothing relieves the symptoms.  ? ?  ? ?Home Medications ?Prior to Admission medications   ?Medication Sig Start Date End Date Taking? Authorizing Provider  ?acetaminophen (TYLENOL) 500 MG tablet Take by mouth. 07/13/21   [provider]  ?albuterol (VENTOLIN HFA) 108 (90 Base) MCG/ACT inhaler Inhale 2 puffs into the lungs every 4 (four) hours as needed for wheezing or shortness of breath. 08/21/21   Orpah Greek, MD  ?amLODipine (NORVASC) 10 MG tablet Take 1 tablet (10 mg total) by mouth daily. 08/30/21   Orson Eva, MD  ?DULoxetine (CYMBALTA) 60 MG capsule Take 1 capsule (60 mg total) by mouth daily. 08/08/21   [name], FNP  ?fluticasone-salmeterol (ADVAIR DISKUS) 250-50 MCG/ACT AEPB Inhale 1 puff into the lungs in the morning and at bedtime. ?Patient not taking: Reported on 05/25/2021 04/19/21 05/20/21  Deatra James, MD  ?folic acid (FOLVITE) 1 MG tablet Take 1 tablet (1 mg total) by mouth daily. ?Patient not taking: Reported on 09/10/2021 05/22/21   Niel Hummer A, MD  ?hydrOXYzine (VISTARIL) 25 MG capsule Take by mouth. 07/13/21    [provider]  ?levothyroxine (SYNTHROID) 50 MCG tablet Take 1 tablet (50 mcg total) by mouth daily before breakfast. 06/22/20   [name], FNP  ?Multiple Vitamin (MULTIVITAMIN WITH MINERALS) TABS tablet Take 1 tablet by mouth daily. ?Patient not taking: Reported on 09/10/2021 05/22/21   Regalado, Jerald Kief A, MD  ?ondansetron (ZOFRAN) 4 MG tablet Take 1 tablet (4 mg total) by mouth every 8 (eight) hours as needed for nausea or vomiting. 08/08/21   [name], FNP  ?pantoprazole (PROTONIX) 40 MG tablet Take 1 tablet (40 mg total) by mouth 2 (two) times daily. ?Patient not taking: Reported on 09/10/2021 08/29/21   Orson Eva, MD  ?sucralfate (CARAFATE) 1 GM/10ML suspension Take 10 mLs (1 g total) by mouth 4 (four) times daily -  with meals and at bedtime. ?Patient not taking: Reported on 09/10/2021 08/29/21   Orson Eva, MD  ?thiamine 100 MG tablet Take 1 tablet (100 mg total) by mouth daily. ?Patient not taking: Reported on 09/10/2021 05/22/21   Niel Hummer A, MD  ?traMADol (ULTRAM) 50 MG tablet Take 50 mg by mouth every 6 (six) hours as needed.    [provider]  ?vitamin B-12 (CYANOCOBALAMIN) 1000 MCG tablet Take 1 tablet (1,000 mcg total) by mouth daily. ?Patient not taking: Reported on 09/10/2021 05/21/21   Regalado, Jerald Kief A, MD  ?Vitamin D, Ergocalciferol, (DRISDOL) 1.25 MG (50000 UNIT) CAPS capsule Take 1 capsule (50,000 Units total) by mouth every 7 (seven) days. ?Patient not taking: Reported on 09/10/2021 06/22/20  [name], FNP  ?   ? ?Allergies    ?Librium [chlordiazepoxide], Toradol [ketorolac tromethamine], Butalbital-apap-caffeine, Demerol, Esgic [butalbital-apap-caffeine], Iodine, and Pyridium [phenazopyridine]   ? ?Review of Systems   ?Review of Systems  ?Constitutional:  Negative for appetite change and fatigue.  ?HENT:  Negative for congestion, ear discharge and sinus pressure.   ?Eyes:  Negative for discharge.  ?Respiratory:  Negative for cough.    ?Cardiovascular:  Negative for chest pain.  ?Gastrointestinal:  Negative for abdominal pain and diarrhea.  ?Genitourinary:  Negative for frequency and hematuria.  ?Musculoskeletal:  Negative for back pain.  ?Skin:  Negative for rash.  ?Neurological:  Negative for seizures and headaches.  ?Psychiatric/Behavioral:  Negative for hallucinations.   ? ?Physical Exam ?Updated Vital Signs ?BP (!) 180/109   Pulse (!) 113   Resp 20   Ht '5\' 1"'$  (1.549 m)   Wt 53.5 kg   SpO2 100%   BMI 22.30 kg/m?  ?Physical Exam ?Vitals and nursing note reviewed.  ?Constitutional:   ?   Appearance: She is well-developed.  ?HENT:  ?   Head: Normocephalic.  ?   Nose: Nose normal.  ?Eyes:  ?   General: No scleral icterus. ?   Conjunctiva/sclera: Conjunctivae normal.  ?Neck:  ?   Thyroid: No thyromegaly.  ?Cardiovascular:  ?   Rate and Rhythm: Normal rate and regular rhythm.  ?   Heart sounds: No murmur heard. ?  No friction rub. No gallop.  ?Pulmonary:  ?   Breath sounds: No stridor. No wheezing or rales.  ?Chest:  ?   Chest wall: No tenderness.  ?Abdominal:  ?   General: There is no distension.  ?   Tenderness: There is no abdominal tenderness. There is no rebound.  ?Musculoskeletal:     ?   General: Normal range of motion.  ?   Cervical back: Neck supple.  ?Lymphadenopathy:  ?   Cervical: No cervical adenopathy.  ?Skin: ?   Findings: No erythema or rash.  ?Neurological:  ?   Mental Status: She is alert and oriented to person, place, and time.  ?   Motor: No abnormal muscle tone.  ?   Coordination: Coordination normal.  ?Psychiatric:     ?   Behavior: Behavior normal.  ? ? ?ED Results / Procedures / Treatments   ?Labs ?(all labs ordered are listed, but only abnormal results are displayed) ?Labs Reviewed  ?CBC WITH DIFFERENTIAL/PLATELET - Abnormal; Notable for the following components:  ?    Result Value  ? Hemoglobin 11.0 (*)   ? HCT 35.2 (*)   ? RDW 19.4 (*)   ? All other components within normal limits  ?COMPREHENSIVE METABOLIC PANEL -  Abnormal; Notable for the following components:  ? Potassium 2.9 (*)   ? CO2 14 (*)   ? Glucose, Bld 173 (*)   ? Calcium 7.5 (*)   ? AST 51 (*)   ? Anion gap 20 (*)   ? All other components within normal limits  ?ETHANOL  ? ? ?EKG ?None ? ?Radiology ?CT Head Wo Contrast ? ?Result Date: 09/17/2021 ?CLINICAL DATA:  Dizziness, possibly ETOH withdrawal EXAM: CT HEAD WITHOUT CONTRAST TECHNIQUE: Contiguous axial images were obtained from the base of the skull through the vertex without intravenous contrast. RADIATION DOSE REDUCTION: This exam was performed according to the departmental dose-optimization program which includes automated exposure control, adjustment of the mA and/or kV according to patient size and/or use of iterative reconstruction technique. COMPARISON:  09/10/2021 FINDINGS: Brain: No evidence of acute infarction, hemorrhage, hydrocephalus, extra-axial collection or mass lesion/mass effect. Mild subcortical white matter and periventricular small vessel ischemic changes. Vascular: No hyperdense vessel or unexpected calcification. Skull: Normal. Negative for fracture or focal lesion. Sinuses/Orbits: Mild mucosal thickening in the bilateral ethmoid sinuses. Partial opacification of the bilateral mastoid air cells, unchanged. Other: None. IMPRESSION: No evidence of acute intracranial abnormality. Mild small vessel ischemic changes. Partial bilateral mastoid effusions, unchanged. Electronically Signed   By: Julian Hy M.D.   On: 09/17/2021 21:44  ? ?DG Chest Port 1 View ? ?Result Date: 09/17/2021 ?CLINICAL DATA:  Shortness of breath. EXAM: PORTABLE CHEST 1 VIEW COMPARISON:  09/10/2021, CT 08/21/2021 FINDINGS: The cardiomediastinal contours are normal. Atherosclerosis of the thoracic aorta. Mild chronic bronchial thickening pulmonary vasculature is normal. No consolidation, pleural effusion, or pneumothorax. No acute osseous abnormalities are seen. IMPRESSION: No acute chest findings. Electronically Signed    By: Keith Rake M.D.   On: 09/17/2021 21:01   ? ?Procedures ?Procedures  ? ? ?Medications Ordered in ED ?Medications  ?potassium chloride 10 mEq in 100 mL IVPB (10 mEq Intravenous New Bag/Given 09/17/21 2252)  ?sodium chlori

## 2021-09-17 NOTE — ED Notes (Signed)
Pt given 1L NS and '4mg'$  zofran by EMS. ?

## 2021-09-17 NOTE — ED Triage Notes (Signed)
Pt brought in by [facility] from home c/o possible ETOH withdrawals. Per EMS pts son took all of her alcohol away from her. Pt states she hasn't had anything ETOH since yesterday.  ?

## 2021-09-17 NOTE — ED Notes (Signed)
Pt transported to CT ?

## 2021-09-18 ENCOUNTER — Other Ambulatory Visit: Payer: Self-pay

## 2021-09-18 DIAGNOSIS — E038 Other specified hypothyroidism: Secondary | ICD-10-CM

## 2021-09-18 DIAGNOSIS — G9341 Metabolic encephalopathy: Secondary | ICD-10-CM | POA: Diagnosis not present

## 2021-09-18 DIAGNOSIS — F10930 Alcohol use, unspecified with withdrawal, uncomplicated: Secondary | ICD-10-CM | POA: Diagnosis not present

## 2021-09-18 DIAGNOSIS — E8729 Other acidosis: Secondary | ICD-10-CM | POA: Diagnosis present

## 2021-09-18 DIAGNOSIS — I10 Essential (primary) hypertension: Secondary | ICD-10-CM | POA: Diagnosis not present

## 2021-09-18 DIAGNOSIS — R569 Unspecified convulsions: Secondary | ICD-10-CM

## 2021-09-18 LAB — CBC WITH DIFFERENTIAL/PLATELET
Abs Immature Granulocytes: 0.02 10*3/uL (ref 0.00–0.07)
Basophils Absolute: 0 10*3/uL (ref 0.0–0.1)
Basophils Relative: 1 %
Eosinophils Absolute: 0 10*3/uL (ref 0.0–0.5)
Eosinophils Relative: 1 %
HCT: 33.9 % — ABNORMAL LOW (ref 36.0–46.0)
Hemoglobin: 10.6 g/dL — ABNORMAL LOW (ref 12.0–15.0)
Immature Granulocytes: 0 %
Lymphocytes Relative: 29 %
Lymphs Abs: 1.8 10*3/uL (ref 0.7–4.0)
MCH: 25.7 pg — ABNORMAL LOW (ref 26.0–34.0)
MCHC: 31.3 g/dL (ref 30.0–36.0)
MCV: 82.1 fL (ref 80.0–100.0)
Monocytes Absolute: 0.7 10*3/uL (ref 0.1–1.0)
Monocytes Relative: 11 %
Neutro Abs: 3.6 10*3/uL (ref 1.7–7.7)
Neutrophils Relative %: 58 %
Platelets: 156 10*3/uL (ref 150–400)
RBC: 4.13 MIL/uL (ref 3.87–5.11)
RDW: 19.2 % — ABNORMAL HIGH (ref 11.5–15.5)
WBC: 6.2 10*3/uL (ref 4.0–10.5)
nRBC: 0 % (ref 0.0–0.2)

## 2021-09-18 LAB — COMPREHENSIVE METABOLIC PANEL
ALT: 25 U/L (ref 0–44)
AST: 47 U/L — ABNORMAL HIGH (ref 15–41)
Albumin: 3.6 g/dL (ref 3.5–5.0)
Alkaline Phosphatase: 97 U/L (ref 38–126)
Anion gap: 13 (ref 5–15)
BUN: 7 mg/dL — ABNORMAL LOW (ref 8–23)
CO2: 24 mmol/L (ref 22–32)
Calcium: 7.7 mg/dL — ABNORMAL LOW (ref 8.9–10.3)
Chloride: 101 mmol/L (ref 98–111)
Creatinine, Ser: 0.68 mg/dL (ref 0.44–1.00)
GFR, Estimated: >60 mL/min (ref >60)
Glucose, Bld: 95 mg/dL (ref 70–99)
Potassium: 3.2 mmol/L — ABNORMAL LOW (ref 3.5–5.1)
Sodium: 138 mmol/L (ref 135–145)
Total Bilirubin: 0.8 mg/dL (ref 0.3–1.2)
Total Protein: 6.6 g/dL (ref 6.5–8.1)

## 2021-09-18 LAB — BLOOD GAS, VENOUS
Acid-Base Excess: 3.2 mmol/L — ABNORMAL HIGH (ref 0.0–2.0)
Bicarbonate: 26.9 mmol/L (ref 20.0–28.0)
Drawn by: 44828
O2 Saturation: 82.5 %
Patient temperature: 36.4
pCO2, Ven: 36 mmHg — ABNORMAL LOW (ref 44–60)
pH, Ven: 7.48 — ABNORMAL HIGH (ref 7.25–7.43)
pO2, Ven: 41 mmHg (ref 32–45)

## 2021-09-18 LAB — MAGNESIUM: Magnesium: 1.2 mg/dL — ABNORMAL LOW (ref 1.7–2.4)

## 2021-09-18 MED ORDER — MORPHINE SULFATE (PF) 2 MG/ML IV SOLN
2.0000 mg | INTRAVENOUS | Status: DC | PRN
  Administered 2021-09-18 – 2021-09-19 (×5): 2 mg via INTRAVENOUS
  Filled 2021-09-18 (×5): qty 1

## 2021-09-18 MED ORDER — POTASSIUM CHLORIDE 10 MEQ/100ML IV SOLN
10.0000 meq | INTRAVENOUS | Status: AC
  Administered 2021-09-18 (×4): 10 meq via INTRAVENOUS
  Filled 2021-09-18 (×3): qty 100

## 2021-09-18 MED ORDER — LORAZEPAM 1 MG PO TABS: 1.0000 mg | ORAL_TABLET | ORAL | Status: AC | PRN

## 2021-09-18 MED ORDER — HEPARIN SODIUM (PORCINE) 5000 UNIT/ML IJ SOLN
5000.0000 [IU] | Freq: Three times a day (TID) | INTRAMUSCULAR | Status: DC
  Administered 2021-09-18 – 2021-09-23 (×16): 5000 [IU] via SUBCUTANEOUS
  Filled 2021-09-18 (×16): qty 1

## 2021-09-18 MED ORDER — LORAZEPAM 2 MG/ML IJ SOLN
0.0000 mg | Freq: Four times a day (QID) | INTRAMUSCULAR | Status: AC
  Administered 2021-09-18 (×2): 2 mg via INTRAVENOUS
  Administered 2021-09-19: 1 mg via INTRAVENOUS
  Administered 2021-09-19: 2 mg via INTRAVENOUS
  Administered 2021-09-19: 4 mg via INTRAVENOUS
  Administered 2021-09-19: 2 mg via INTRAVENOUS
  Filled 2021-09-18: qty 1
  Filled 2021-09-18: qty 2
  Filled 2021-09-18: qty 1
  Filled 2021-09-18: qty 2
  Filled 2021-09-18: qty 1

## 2021-09-18 MED ORDER — ADULT MULTIVITAMIN W/MINERALS CH
1.0000 | ORAL_TABLET | Freq: Every day | ORAL | Status: DC
  Administered 2021-09-18 – 2021-09-22 (×4): 1 via ORAL
  Filled 2021-09-18 (×5): qty 1

## 2021-09-18 MED ORDER — FOLIC ACID 1 MG PO TABS
1.0000 mg | ORAL_TABLET | Freq: Every day | ORAL | Status: DC
  Administered 2021-09-18 – 2021-09-19 (×2): 1 mg via ORAL
  Filled 2021-09-18 (×2): qty 1

## 2021-09-18 MED ORDER — POLYETHYLENE GLYCOL 3350 17 G PO PACK: 17.0000 g | PACK | Freq: Every day | ORAL | Status: DC | PRN

## 2021-09-18 MED ORDER — LORAZEPAM 2 MG/ML IJ SOLN
1.0000 mg | INTRAMUSCULAR | Status: DC | PRN
  Administered 2021-09-18 (×4): 2 mg via INTRAVENOUS
  Administered 2021-09-18: 3 mg via INTRAVENOUS
  Filled 2021-09-18 (×4): qty 1
  Filled 2021-09-18: qty 2

## 2021-09-18 MED ORDER — ALBUTEROL SULFATE HFA 108 (90 BASE) MCG/ACT IN AERS: 2.0000 | INHALATION_SPRAY | RESPIRATORY_TRACT | Status: DC | PRN

## 2021-09-18 MED ORDER — ONDANSETRON HCL 4 MG PO TABS: 4.0000 mg | ORAL_TABLET | Freq: Four times a day (QID) | ORAL | Status: DC | PRN

## 2021-09-18 MED ORDER — OXYCODONE HCL 5 MG PO TABS
5.0000 mg | ORAL_TABLET | ORAL | Status: DC | PRN
  Administered 2021-09-18 – 2021-09-23 (×6): 5 mg via ORAL
  Filled 2021-09-18 (×6): qty 1

## 2021-09-18 MED ORDER — LORAZEPAM 2 MG/ML IJ SOLN
0.0000 mg | Freq: Two times a day (BID) | INTRAMUSCULAR | Status: AC
  Administered 2021-09-22: 1 mg via INTRAVENOUS
  Filled 2021-09-18: qty 1

## 2021-09-18 MED ORDER — HYDROXYZINE HCL 25 MG PO TABS
50.0000 mg | ORAL_TABLET | Freq: Three times a day (TID) | ORAL | Status: DC | PRN
  Administered 2021-09-21 – 2021-09-22 (×2): 50 mg via ORAL
  Filled 2021-09-18 (×2): qty 2

## 2021-09-18 MED ORDER — THIAMINE HCL 100 MG/ML IJ SOLN
100.0000 mg | Freq: Every day | INTRAMUSCULAR | Status: DC
  Administered 2021-09-20: 100 mg via INTRAVENOUS
  Filled 2021-09-18: qty 2

## 2021-09-18 MED ORDER — DULOXETINE HCL 60 MG PO CPEP
60.0000 mg | ORAL_CAPSULE | Freq: Every day | ORAL | Status: DC
  Administered 2021-09-18 – 2021-09-23 (×6): 60 mg via ORAL
  Filled 2021-09-18: qty 1
  Filled 2021-09-18: qty 2
  Filled 2021-09-18 (×4): qty 1

## 2021-09-18 MED ORDER — AMLODIPINE BESYLATE 5 MG PO TABS
10.0000 mg | ORAL_TABLET | Freq: Every day | ORAL | Status: DC
  Administered 2021-09-18 – 2021-09-23 (×5): 10 mg via ORAL
  Filled 2021-09-18 (×5): qty 2

## 2021-09-18 MED ORDER — LORAZEPAM 2 MG/ML IJ SOLN
1.0000 mg | INTRAMUSCULAR | Status: AC | PRN
  Administered 2021-09-18 – 2021-09-19 (×5): 2 mg via INTRAVENOUS
  Administered 2021-09-19: 4 mg via INTRAVENOUS
  Administered 2021-09-19: 2 mg via INTRAVENOUS
  Filled 2021-09-18 (×8): qty 1

## 2021-09-18 MED ORDER — LEVETIRACETAM IN NACL 500 MG/100ML IV SOLN
500.0000 mg | Freq: Two times a day (BID) | INTRAVENOUS | Status: AC
  Administered 2021-09-18 – 2021-09-20 (×6): 500 mg via INTRAVENOUS
  Filled 2021-09-18 (×6): qty 100

## 2021-09-18 MED ORDER — SODIUM CHLORIDE 0.9 % IV SOLN: INTRAVENOUS | Status: DC

## 2021-09-18 MED ORDER — ONDANSETRON HCL 4 MG/2ML IJ SOLN
4.0000 mg | Freq: Four times a day (QID) | INTRAMUSCULAR | Status: DC | PRN
  Administered 2021-09-18: 4 mg via INTRAVENOUS
  Filled 2021-09-18: qty 2

## 2021-09-18 MED ORDER — MAGNESIUM SULFATE 4 GM/100ML IV SOLN
4.0000 g | Freq: Once | INTRAVENOUS | Status: AC
  Administered 2021-09-18: 4 g via INTRAVENOUS
  Filled 2021-09-18: qty 100

## 2021-09-18 MED ORDER — THIAMINE HCL 100 MG PO TABS
100.0000 mg | ORAL_TABLET | Freq: Every day | ORAL | Status: DC
  Administered 2021-09-18 – 2021-09-21 (×3): 100 mg via ORAL
  Filled 2021-09-18 (×3): qty 1

## 2021-09-18 MED ORDER — ACETAMINOPHEN 325 MG PO TABS
650.0000 mg | ORAL_TABLET | Freq: Four times a day (QID) | ORAL | Status: DC | PRN
Start: 2021-09-18 — End: 2021-09-23
  Administered 2021-09-21: 650 mg via ORAL
  Filled 2021-09-18: qty 2

## 2021-09-18 MED ORDER — LORAZEPAM 1 MG PO TABS
1.0000 mg | ORAL_TABLET | ORAL | Status: DC | PRN
  Administered 2021-09-18: 2 mg via ORAL
  Filled 2021-09-18: qty 2

## 2021-09-18 MED ORDER — LEVOTHYROXINE SODIUM 50 MCG PO TABS
50.0000 ug | ORAL_TABLET | Freq: Every day | ORAL | Status: DC
  Administered 2021-09-18 – 2021-09-23 (×6): 50 ug via ORAL
  Filled 2021-09-18: qty 1
  Filled 2021-09-18 (×2): qty 2
  Filled 2021-09-18: qty 1
  Filled 2021-09-18 (×2): qty 2

## 2021-09-18 MED ORDER — CHLORHEXIDINE GLUCONATE CLOTH 2 % EX PADS
6.0000 | MEDICATED_PAD | Freq: Every day | CUTANEOUS | Status: DC
  Administered 2021-09-18 – 2021-09-23 (×5): 6 via TOPICAL

## 2021-09-18 MED ORDER — PANTOPRAZOLE SODIUM 40 MG PO TBEC
40.0000 mg | DELAYED_RELEASE_TABLET | Freq: Two times a day (BID) | ORAL | Status: DC
  Administered 2021-09-18 – 2021-09-19 (×3): 40 mg via ORAL
  Filled 2021-09-18 (×3): qty 1

## 2021-09-18 MED ORDER — HEPARIN SODIUM (PORCINE) 5000 UNIT/ML IJ SOLN: 5000.0000 [IU] | Freq: Three times a day (TID) | INTRAMUSCULAR | Status: DC

## 2021-09-18 MED ORDER — ACETAMINOPHEN 650 MG RE SUPP: 650.0000 mg | Freq: Four times a day (QID) | RECTAL | Status: DC | PRN

## 2021-09-18 NOTE — Assessment & Plan Note (Signed)
Continue Synthroid °

## 2021-09-18 NOTE — Assessment & Plan Note (Addendum)
No Seizure activity since admission ?EEG with no epileptiform activity.    ?D/C Keppra ?Alcohol level less than 10 on admission ?Seizure precautions ?Continue to monitor ?

## 2021-09-18 NOTE — Assessment & Plan Note (Addendum)
repleted ?

## 2021-09-18 NOTE — H&P (Signed)
?History and Physical  ? ? ?Patient: Stacy Dalton PJK:[id number] DOB: 1951/03/21 ?DOA: 09/17/2021 ?DOS: the patient was seen and examined on 09/18/2021 ?PCP: [name], FNP  ?Patient coming from: Home ? ?Chief Complaint:  ?Chief Complaint  ?Patient presents with  ? Alcohol Problem  ? ?HPI: Stacy Dalton is a 71 y.o. female with medical history significant of alcohol abuse, COPD, depression, GERD, hypertension, hypothyroidism, and more presents ED with a chief complaint of alcohol withdrawal.  Patient reports she does not know why she is here but if she had to guess is because she drinks too much.  Is reported that her son took her alcohol away from her couple days ago and today she had seizure-like activity.  Patient is oriented to self place and time.  She reports she is interested in cutting back on drinking, but is not interested in total cessation.  She would like to go back to [place], as she does not feel like she can take care of herself at home anymore.  Patient has no other complaints at this time. ?Review of Systems: As mentioned in the history of present illness. All other systems reviewed and are negative. ?Past Medical History:  ?Diagnosis Date  ? Chronic pain   ? COPD (chronic obstructive pulmonary disease) ([place])   ? told has copd, no current inhaler use  ? Cough   ? Depression   ? ETOH abuse   ? GERD (gastroesophageal reflux disease)   ? Hypertension   ? Hypothyroidism   ? Insomnia   ? Migraine   ? Migraine   ? Osteopenia   ? Pain management   ? Panic attacks   ? Small bowel obstruction ([place])   ? ?Past Surgical History:  ?Procedure Laterality Date  ? ABDOMINAL HYSTERECTOMY    ? BIOPSY  08/29/2021  ? Procedure: BIOPSY;  Surgeon: [name], DO;  [facility];  Service: Endoscopy;;  ? COLON SURGERY    ? COLOSTOMY CLOSURE  04/19/2012  ? Procedure: COLOSTOMY CLOSURE;  Surgeon: Adin Hector, MD;  Location: [facility];  Service: General;  Laterality: N/A;  Laparotomy, Resection  and Closure of Colostomy  ? COLOSTOMY TAKEDOWN N/A 04/12/2016  ? Procedure: [place] TAKEDOWN;  Surgeon: Johnathan Hausen, MD;  Location: [facility];  Service: General;  Laterality: N/A;  ? ESOPHAGOGASTRODUODENOSCOPY (EGD) WITH PROPOFOL N/A 08/29/2021  ? Procedure: ESOPHAGOGASTRODUODENOSCOPY (EGD) WITH PROPOFOL;  Surgeon: [name], DO;  [facility]: [facility];  Service: Endoscopy;  Laterality: N/A;  ? INCONTINENCE SURGERY    ? LAPAROTOMY  10/04/2011, colostomy also  ? Procedure: EXPLORATORY LAPAROTOMY;  Surgeon: Adin Hector, MD;  Location: [facility];  Service: General;  Laterality: N/A;  left partial colectomy with colostomy  ? LAPAROTOMY  04/19/2012  ? Procedure: EXPLORATORY LAPAROTOMY;  Surgeon: Adin Hector, MD;  Location: [facility];  Service: General;  Laterality: N/A;  ? LAPAROTOMY N/A 11/29/2015  ? Procedure: EXPLORATORY LAPAROTOMY; SUBTOTAL COLECTOMY WITH HARTMAN PROCEDURE AND END COLOSTOMY;  Surgeon: Johnathan Hausen, MD;  Location: [facility];  Service: General;  Laterality: N/A;  ? TUBAL LIGATION    ? VENTRAL HERNIA REPAIR  04/19/2012  ? Procedure: HERNIA REPAIR VENTRAL ADULT;  Surgeon: Adin Hector, MD;  Location: [facility];  Service: General;  Laterality: N/A;  ? ?Social History:  reports that she quit smoking about 22 years ago. Her smoking use included cigarettes. She has a 30.00 pack-year smoking history. She has never used smokeless tobacco. She reports  current alcohol use. She reports that she does not use drugs. ? ?Allergies  ?Allergen Reactions  ? Librium [Chlordiazepoxide]   ?  Became unresponsive  ? Toradol [Ketorolac Tromethamine] Shortness Of Breath  ? Butalbital-Apap-Caffeine Other (See Comments)  ?  jittery  ? Demerol Swelling  ? Esgic [Butalbital-Apap-Caffeine] Other (See Comments)  ?  jittery  ? Iodine Other (See Comments)  ?  bp bottomed out per pt several hours later; unsure if pre medicated in past with cm; done in [place]  ? Pyridium [Phenazopyridine] Nausea Only  ? ? ?Family History   ?Problem Relation Age of Onset  ? Heart disease Mother   ? Hypertension Mother   ? Cancer Sister   ?     sinus  ? Diabetes Brother   ? Heart disease Brother   ? Thyroid disease Brother   ? Cancer Sister   ?     abdominal ?  ? Thyroid disease Sister   ? Cancer Other   ?     GE junction adenocarcinoma  ? Emphysema Father   ? Stroke Father   ? Hypertension Father   ? Heart disease Father   ? COPD Sister   ? Thyroid disease Sister   ? Thyroid disease Sister   ? Diabetes Brother   ? Thyroid disease Brother   ? Thyroid disease Brother   ? Hypertension Brother   ? Post-traumatic stress disorder Brother   ? COPD Brother   ? Thyroid disease Brother   ? Thyroid disease Brother   ? Hypertension Brother   ? Transient ischemic attack Brother   ? ? ?Prior to Admission medications   ?Medication Sig Start Date End Date Taking? Authorizing Provider  ?acetaminophen (TYLENOL) 500 MG tablet Take by mouth. 07/13/21   [provider]  ?albuterol (VENTOLIN HFA) 108 (90 Base) MCG/ACT inhaler Inhale 2 puffs into the lungs every 4 (four) hours as needed for wheezing or shortness of breath. 08/21/21   Orpah Greek, MD  ?amLODipine (NORVASC) 10 MG tablet Take 1 tablet (10 mg total) by mouth daily. 08/30/21   Orson Eva, MD  ?DULoxetine (CYMBALTA) 60 MG capsule Take 1 capsule (60 mg total) by mouth daily. 08/08/21   [name], FNP  ?fluticasone-salmeterol (ADVAIR DISKUS) 250-50 MCG/ACT AEPB Inhale 1 puff into the lungs in the morning and at bedtime. ?Patient not taking: Reported on 05/25/2021 04/19/21 05/20/21  Deatra James, MD  ?folic acid (FOLVITE) 1 MG tablet Take 1 tablet (1 mg total) by mouth daily. ?Patient not taking: Reported on 09/10/2021 05/22/21   Niel Hummer A, MD  ?hydrOXYzine (VISTARIL) 25 MG capsule Take by mouth. 07/13/21   [provider]  ?levothyroxine (SYNTHROID) 50 MCG tablet Take 1 tablet (50 mcg total) by mouth daily before breakfast. 06/22/20   [name], FNP  ?Multiple  Vitamin (MULTIVITAMIN WITH MINERALS) TABS tablet Take 1 tablet by mouth daily. ?Patient not taking: Reported on 09/10/2021 05/22/21   Regalado, Jerald Kief A, MD  ?ondansetron (ZOFRAN) 4 MG tablet Take 1 tablet (4 mg total) by mouth every 8 (eight) hours as needed for nausea or vomiting. 08/08/21   [name], FNP  ?pantoprazole (PROTONIX) 40 MG tablet Take 1 tablet (40 mg total) by mouth 2 (two) times daily. ?Patient not taking: Reported on 09/10/2021 08/29/21   Orson Eva, MD  ?sucralfate (CARAFATE) 1 GM/10ML suspension Take 10 mLs (1 g total) by mouth 4 (four) times daily -  with meals and at bedtime. ?Patient  not taking: Reported on 09/10/2021 08/29/21   Orson Eva, MD  ?thiamine 100 MG tablet Take 1 tablet (100 mg total) by mouth daily. ?Patient not taking: Reported on 09/10/2021 05/22/21   Niel Hummer A, MD  ?traMADol (ULTRAM) 50 MG tablet Take 50 mg by mouth every 6 (six) hours as needed.    [provider]  ?vitamin B-12 (CYANOCOBALAMIN) 1000 MCG tablet Take 1 tablet (1,000 mcg total) by mouth daily. ?Patient not taking: Reported on 09/10/2021 05/21/21   Regalado, Jerald Kief A, MD  ?Vitamin D, Ergocalciferol, (DRISDOL) 1.25 MG (50000 UNIT) CAPS capsule Take 1 capsule (50,000 Units total) by mouth every 7 (seven) days. ?Patient not taking: Reported on 09/10/2021 06/22/20   [name], FNP  ? ? ?Physical Exam: ?Vitals:  ? 09/18/21 0030 09/18/21 0330 09/18/21 0333 09/18/21 0400  ?BP: (!) 178/94 (!) 179/91  (!) 181/81  ?Pulse: 99 87  89  ?Resp: 17  20 (!) 23  ?SpO2: 100% 100%  94%  ?Weight:      ?Height:      ? ?1.  General: ?Patient lying supine in bed,  no acute distress ?  ?2. Psychiatric: ?Alert and oriented x 3, mood and behavior normal for situation, pleasant and cooperative with exam ?  ?3. Neurologic: ?Speech and language are normal, face is symmetric, moves all 4 extremities voluntarily, at baseline without acute deficits on limited exam ?  ?4. HEENMT:  ?Head is atraumatic, normocephalic, pupils  reactive to light, neck is supple, trachea is midline, mucous membranes are moist ?  ?5. Respiratory : ?Lungs are clear to auscultation bilaterally without wheezing, rhonchi, rales, no cyanosis, no increase in

## 2021-09-18 NOTE — Assessment & Plan Note (Addendum)
Due to alcohol withdrawal ?Overall improving>>back to baseline ?Restarted home folate and B12 ?

## 2021-09-18 NOTE — Plan of Care (Signed)
?  Problem: Education: ?Goal: Knowledge of General Education information will improve ?Description: Including pain rating scale, medication(s)/side effects and non-pharmacologic comfort measures ?09/18/2021 2127 by Colman Cater, RN ?Outcome: Progressing ?09/18/2021 2127 by Colman Cater, RN ?Outcome: Progressing ?  ?Problem: Health Behavior/Discharge Planning: ?Goal: Ability to manage health-related needs will improve ?09/18/2021 2127 by Colman Cater, RN ?Outcome: Progressing ?09/18/2021 2127 by Colman Cater, RN ?Outcome: Progressing ?  ?Problem: Clinical Measurements: ?Goal: Ability to maintain clinical measurements within normal limits will improve ?09/18/2021 2127 by Colman Cater, RN ?Outcome: Progressing ?09/18/2021 2127 by Colman Cater, RN ?Outcome: Progressing ?Goal: Will remain free from infection ?09/18/2021 2127 by Colman Cater, RN ?Outcome: Progressing ?09/18/2021 2127 by Colman Cater, RN ?Outcome: Progressing ?Goal: Diagnostic test results will improve ?09/18/2021 2127 by Colman Cater, RN ?Outcome: Progressing ?09/18/2021 2127 by Colman Cater, RN ?Outcome: Progressing ?Goal: Respiratory complications will improve ?09/18/2021 2127 by Colman Cater, RN ?Outcome: Progressing ?09/18/2021 2127 by Colman Cater, RN ?Outcome: Progressing ?Goal: Cardiovascular complication will be avoided ?09/18/2021 2127 by Colman Cater, RN ?Outcome: Progressing ?09/18/2021 2127 by Colman Cater, RN ?Outcome: Progressing ?  ?Problem: Activity: ?Goal: Risk for activity intolerance will decrease ?09/18/2021 2127 by Colman Cater, RN ?Outcome: Progressing ?09/18/2021 2127 by Colman Cater, RN ?Outcome: Progressing ?  ?Problem: Nutrition: ?Goal: Adequate nutrition will be maintained ?09/18/2021 2127 by Colman Cater, RN ?Outcome: Progressing ?09/18/2021 2127 by Colman Cater, RN ?Outcome: Progressing ?  ?Problem: Coping: ?Goal: Level of anxiety will decrease ?09/18/2021 2127 by Colman Cater, RN ?Outcome: Progressing ?09/18/2021 2127 by Colman Cater,  RN ?Outcome: Progressing ?  ?Problem: Elimination: ?Goal: Will not experience complications related to bowel motility ?09/18/2021 2127 by Colman Cater, RN ?Outcome: Progressing ?09/18/2021 2127 by Colman Cater, RN ?Outcome: Progressing ?Goal: Will not experience complications related to urinary retention ?09/18/2021 2127 by Colman Cater, RN ?Outcome: Progressing ?09/18/2021 2127 by Colman Cater, RN ?Outcome: Progressing ?  ?Problem: Pain Managment: ?Goal: General experience of comfort will improve ?09/18/2021 2127 by Colman Cater, RN ?Outcome: Progressing ?09/18/2021 2127 by Colman Cater, RN ?Outcome: Progressing ?  ?Problem: Safety: ?Goal: Ability to remain free from injury will improve ?09/18/2021 2127 by Colman Cater, RN ?Outcome: Progressing ?09/18/2021 2127 by Colman Cater, RN ?Outcome: Progressing ?  ?Problem: Skin Integrity: ?Goal: Risk for impaired skin integrity will decrease ?09/18/2021 2127 by Colman Cater, RN ?Outcome: Progressing ?09/18/2021 2127 by Colman Cater, RN ?Outcome: Progressing ?  ?

## 2021-09-18 NOTE — Assessment & Plan Note (Addendum)
Patient had an anion gap of 14 ?Treating supportively with IV fluids>>improved ?

## 2021-09-18 NOTE — ED Notes (Signed)
Pt given water at this time 

## 2021-09-18 NOTE — Assessment & Plan Note (Addendum)
Remains poorly controlled.  ?Continue Norvasc, add metoprolol 12.5 mg BID ?Added losartain ?Adding hydralazine IV PRN SBP>180 ?

## 2021-09-18 NOTE — Assessment & Plan Note (Addendum)
With seizure-like activity at home ?EEG--excess beta activity, no seizure ?D/c Keppra ?Patient reports she does not know how much she drinks a day, when she starts drinking she cannot stop, she does drink vodka and gallon jugs of vodka found at her home per son's report.  ?precedex infusion started 3/21>>wean ?Start high dose thiamine ?D/c with daily thiamine ?

## 2021-09-18 NOTE — Hospital Course (Addendum)
a 70 y.o. female with medical history significant of alcohol abuse, COPD, depression, GERD, hypertension, hypothyroidism, and more presents ED with a chief complaint of alcohol withdrawal.  Patient reports she does not know why she is here but if she had to guess is because she drinks too much.  Is reported that her son took her alcohol away from her couple days ago and today she had seizure-like activity.  Patient is oriented to self place and time.  She reports she is interested in cutting back on drinking, but is not interested in total cessation.  She would like to go back to [place], as she does not feel like she can take care of herself at home anymore.  Patient has no other complaints at this time.  Pt has had severe alcohol withdrawal in the past requiring ICU care.   ? ?09/18/2021:  Pt asking for more pain medication. Admits to heavy daily alcohol consumption.  Pt requesting placement at [facility].   ? ?09/19/2021: no further seizure activity. Actively being treated with CIWA protocol. Pt has history of severe withdrawal and being monitored in SDU. TOC consulted for placement at [facility] per patient request.  ? ?09/20/2021: Pt became severely agitated in early morning hours and ended up being started on precedex infusion. Remains in ICU on precedex at this time.  Family reporting large amounts of chronic alcohol abuse at home.  ? ?09/21/21:  remains on precedex at 0.3 mcg/kg/min.  More lucid, less agitation.  No vomiting or diarrhea. ? ?09/22/21:  more sob.  Desaturation to 88% on RA.  CXR with LLL opacity and vascular congestion.  Lasix IV x 1 and zosyn started ? ?09/23/21: patient remains clinically stable for d/c.  Oxygen saturations improved 95-96% on RA and remains afebrile and hemodynamically stable.  Tolerating diet, eating 75% of meals.  No signs of alcohol withdrawal or seizures. ?

## 2021-09-18 NOTE — Progress Notes (Signed)
ASSUMPTION OF CARE NOTE  ? ?09/18/2021 ?1:59 PM ? ?Stacy Dalton was seen and examined.  The H&P by the admitting provider, orders, imaging was reviewed.  Please see new orders.  Will continue to follow.  ? ?a 71 y.o. female with medical history significant of alcohol abuse, COPD, depression, GERD, hypertension, hypothyroidism, and more presents ED with a chief complaint of alcohol withdrawal.  Patient reports she does not know why she is here but if she had to guess is because she drinks too much.  Is reported that her son took her alcohol away from her couple days ago and today she had seizure-like activity.  Patient is oriented to self place and time.  She reports she is interested in cutting back on drinking, but is not interested in total cessation.  She would like to go back to [place], as she does not feel like she can take care of herself at home anymore.  Patient has no other complaints at this time.  Pt has had severe alcohol withdrawal in the past requiring ICU care.   ? ?09/18/2021:  Pt asking for more pain medication. Admits to heavy daily alcohol consumption.  Pt requesting placement at [place].   ? ? ? ?Vitals:  ? 09/18/21 1200 09/18/21 1230  ?BP: (!) 154/85 (!) 170/86  ?Pulse: 86 94  ?Resp: 17 18  ?Temp:    ?SpO2: 96% 93%  ? ? ?Results for orders placed or performed during the hospital encounter of 09/17/21  ?CBC with Differential/Platelet  ?Result Value Ref Range  ? WBC 6.4 4.0 - 10.5 K/uL  ? RBC 4.12 3.87 - 5.11 MIL/uL  ? Hemoglobin 11.0 (L) 12.0 - 15.0 g/dL  ? HCT 35.2 (L) 36.0 - 46.0 %  ? MCV 85.4 80.0 - 100.0 fL  ? MCH 26.7 26.0 - 34.0 pg  ? MCHC 31.3 30.0 - 36.0 g/dL  ? RDW 19.4 (H) 11.5 - 15.5 %  ? Platelets 191 150 - 400 K/uL  ? nRBC 0.0 0.0 - 0.2 %  ? Neutrophils Relative % 66 %  ? Neutro Abs 4.2 1.7 - 7.7 K/uL  ? Lymphocytes Relative 23 %  ? Lymphs Abs 1.5 0.7 - 4.0 K/uL  ? Monocytes Relative 9 %  ? Monocytes Absolute 0.6 0.1 - 1.0 K/uL  ? Eosinophils Relative 1 %  ? Eosinophils  Absolute 0.0 0.0 - 0.5 K/uL  ? Basophils Relative 0 %  ? Basophils Absolute 0.0 0.0 - 0.1 K/uL  ? Immature Granulocytes 1 %  ? Abs Immature Granulocytes 0.07 0.00 - 0.07 K/uL  ?Comprehensive metabolic panel  ?Result Value Ref Range  ? Sodium 140 135 - 145 mmol/L  ? Potassium 2.9 (L) 3.5 - 5.1 mmol/L  ? Chloride 106 98 - 111 mmol/L  ? CO2 14 (L) 22 - 32 mmol/L  ? Glucose, Bld 173 (H) 70 - 99 mg/dL  ? BUN 13 8 - 23 mg/dL  ? Creatinine, Ser 0.88 0.44 - 1.00 mg/dL  ? Calcium 7.5 (L) 8.9 - 10.3 mg/dL  ? Total Protein 6.8 6.5 - 8.1 g/dL  ? Albumin 3.6 3.5 - 5.0 g/dL  ? AST 51 (H) 15 - 41 U/L  ? ALT 25 0 - 44 U/L  ? Alkaline Phosphatase 89 38 - 126 U/L  ? Total Bilirubin 0.5 0.3 - 1.2 mg/dL  ? GFR, Estimated >60 >60 mL/min  ? Anion gap 20 (H) 5 - 15  ?Ethanol  ?Result Value Ref Range  ? Alcohol, Ethyl (  B) <10 <10 mg/dL  ?Comprehensive metabolic panel  ?Result Value Ref Range  ? Sodium 138 135 - 145 mmol/L  ? Potassium 3.2 (L) 3.5 - 5.1 mmol/L  ? Chloride 101 98 - 111 mmol/L  ? CO2 24 22 - 32 mmol/L  ? Glucose, Bld 95 70 - 99 mg/dL  ? BUN 7 (L) 8 - 23 mg/dL  ? Creatinine, Ser 0.68 0.44 - 1.00 mg/dL  ? Calcium 7.7 (L) 8.9 - 10.3 mg/dL  ? Total Protein 6.6 6.5 - 8.1 g/dL  ? Albumin 3.6 3.5 - 5.0 g/dL  ? AST 47 (H) 15 - 41 U/L  ? ALT 25 0 - 44 U/L  ? Alkaline Phosphatase 97 38 - 126 U/L  ? Total Bilirubin 0.8 0.3 - 1.2 mg/dL  ? GFR, Estimated >60 >60 mL/min  ? Anion gap 13 5 - 15  ?Magnesium  ?Result Value Ref Range  ? Magnesium 1.2 (L) 1.7 - 2.4 mg/dL  ?CBC WITH DIFFERENTIAL  ?Result Value Ref Range  ? WBC 6.2 4.0 - 10.5 K/uL  ? RBC 4.13 3.87 - 5.11 MIL/uL  ? Hemoglobin 10.6 (L) 12.0 - 15.0 g/dL  ? HCT 33.9 (L) 36.0 - 46.0 %  ? MCV 82.1 80.0 - 100.0 fL  ? MCH 25.7 (L) 26.0 - 34.0 pg  ? MCHC 31.3 30.0 - 36.0 g/dL  ? RDW 19.2 (H) 11.5 - 15.5 %  ? Platelets 156 150 - 400 K/uL  ? nRBC 0.0 0.0 - 0.2 %  ? Neutrophils Relative % 58 %  ? Neutro Abs 3.6 1.7 - 7.7 K/uL  ? Lymphocytes Relative 29 %  ? Lymphs Abs 1.8 0.7 - 4.0 K/uL  ?  Monocytes Relative 11 %  ? Monocytes Absolute 0.7 0.1 - 1.0 K/uL  ? Eosinophils Relative 1 %  ? Eosinophils Absolute 0.0 0.0 - 0.5 K/uL  ? Basophils Relative 1 %  ? Basophils Absolute 0.0 0.0 - 0.1 K/uL  ? Immature Granulocytes 0 %  ? Abs Immature Granulocytes 0.02 0.00 - 0.07 K/uL  ?Blood gas, venous  ?Result Value Ref Range  ? pH, Ven 7.48 (H) 7.25 - 7.43  ? pCO2, Ven 36 (L) 44 - 60 mmHg  ? pO2, Ven 41 32 - 45 mmHg  ? Bicarbonate 26.9 20.0 - 28.0 mmol/L  ? Acid-Base Excess 3.2 (H) 0.0 - 2.0 mmol/L  ? O2 Saturation 82.5 %  ? Patient temperature 36.4   ? Collection site BLOOD RIGHT WRIST   ? Drawn by [id number]   ? ? ? ?C. Wynetta Emery, MD ?[facility] ? ? 09/17/2021  7:42 PM ?How to contact the [facility] Attending or Consulting provider [place] or covering provider during after hours [place], for this patient?  ?Check the care team in [facility] and look for a) attending/consulting TRH provider listed and b) the [facility] team listed ?Log into [email or web address] and use [place]'s universal password to access. If you do not have the password, please contact the hospital operator. ?Locate the [facility] provider you are looking for under [facility] and page to a number that you can be directly reached. ?If you still have difficulty reaching the provider, please page the [facility] (Director on Call) for the Hospitalists listed on amion for assistance. ? ?

## 2021-09-19 ENCOUNTER — Inpatient Hospital Stay (HOSPITAL_COMMUNITY)
Admit: 2021-09-19 | Discharge: 2021-09-19 | Disposition: A | Discharge status: Home / Self Care | Payer: Medicare Other | Attending: Family Medicine | Admitting: Family Medicine

## 2021-09-19 DIAGNOSIS — R569 Unspecified convulsions: Secondary | ICD-10-CM | POA: Diagnosis not present

## 2021-09-19 DIAGNOSIS — G9341 Metabolic encephalopathy: Secondary | ICD-10-CM | POA: Diagnosis not present

## 2021-09-19 DIAGNOSIS — E8729 Other acidosis: Secondary | ICD-10-CM | POA: Diagnosis not present

## 2021-09-19 DIAGNOSIS — I10 Essential (primary) hypertension: Secondary | ICD-10-CM | POA: Diagnosis not present

## 2021-09-19 DIAGNOSIS — E876 Hypokalemia: Secondary | ICD-10-CM

## 2021-09-19 DIAGNOSIS — F10930 Alcohol use, unspecified with withdrawal, uncomplicated: Secondary | ICD-10-CM | POA: Diagnosis not present

## 2021-09-19 LAB — COMPREHENSIVE METABOLIC PANEL
ALT: 19 U/L (ref 0–44)
AST: 33 U/L (ref 15–41)
Albumin: 3.2 g/dL — ABNORMAL LOW (ref 3.5–5.0)
Alkaline Phosphatase: 94 U/L (ref 38–126)
Anion gap: 10 (ref 5–15)
BUN: 6 mg/dL — ABNORMAL LOW (ref 8–23)
CO2: 24 mmol/L (ref 22–32)
Calcium: 7.5 mg/dL — ABNORMAL LOW (ref 8.9–10.3)
Chloride: 103 mmol/L (ref 98–111)
Creatinine, Ser: 0.84 mg/dL (ref 0.44–1.00)
GFR, Estimated: >60 mL/min (ref >60)
Glucose, Bld: 93 mg/dL (ref 70–99)
Potassium: 3 mmol/L — ABNORMAL LOW (ref 3.5–5.1)
Sodium: 137 mmol/L (ref 135–145)
Total Bilirubin: 0.9 mg/dL (ref 0.3–1.2)
Total Protein: 5.9 g/dL — ABNORMAL LOW (ref 6.5–8.1)

## 2021-09-19 LAB — MRSA NEXT GEN BY PCR, NASAL: MRSA by PCR Next Gen: NOT DETECTED

## 2021-09-19 LAB — MAGNESIUM: Magnesium: 2 mg/dL (ref 1.7–2.4)

## 2021-09-19 MED ORDER — MORPHINE SULFATE (PF) 2 MG/ML IV SOLN
1.0000 mg | INTRAVENOUS | Status: DC | PRN
  Administered 2021-09-20 – 2021-09-22 (×5): 1 mg via INTRAVENOUS
  Filled 2021-09-19 (×5): qty 1

## 2021-09-19 MED ORDER — DEXMEDETOMIDINE HCL IN NACL 400 MCG/100ML IV SOLN
0.4000 ug/kg/h | INTRAVENOUS | Status: DC
  Administered 2021-09-19: 0.4 ug/kg/h via INTRAVENOUS
  Administered 2021-09-20: 0.5 ug/kg/h via INTRAVENOUS
  Administered 2021-09-21: 0.3 ug/kg/h via INTRAVENOUS
  Filled 2021-09-19 (×3): qty 100

## 2021-09-19 MED ORDER — POTASSIUM CHLORIDE 10 MEQ/100ML IV SOLN
10.0000 meq | INTRAVENOUS | Status: AC
  Administered 2021-09-19 (×4): 10 meq via INTRAVENOUS
  Filled 2021-09-19 (×4): qty 100

## 2021-09-19 MED ORDER — METOPROLOL TARTRATE 25 MG PO TABS
12.5000 mg | ORAL_TABLET | Freq: Two times a day (BID) | ORAL | Status: DC
  Administered 2021-09-21 – 2021-09-23 (×5): 12.5 mg via ORAL
  Filled 2021-09-19 (×5): qty 1

## 2021-09-19 NOTE — Procedures (Addendum)
Patient Name: Stacy Dalton  ?MRN: [id number]  ?Epilepsy Attending: Lora Havens  ?Referring Physician/Provider: Zierle-Ghosh, [place] B, DO ?Date: 09/19/2021 ?Duration: 22.14 mins ? ?Patient history: 71yo F with h/o seizures presented with alcohol withdrawal and seizure. EEG to evaluate for seizure ? ?Level of alertness: Awake, asleep ? ?AEDs during EEG study: LEV, ativan ? ?Technical aspects: This EEG study was done with scalp electrodes positioned according to the 10-20 International system of electrode placement. Electrical activity was acquired at a sampling rate of '500Hz'$  and reviewed with a high frequency filter of '70Hz'$  and a low frequency filter of '1Hz'$ . EEG data were recorded continuously and digitally stored.  ? ?Description: The posterior dominant rhythm consists of 8 Hz activity of moderate voltage (25-35 uV) seen predominantly in posterior head regions, symmetric and reactive to eye opening and eye closing. Sleep was characterized by sleep spindles (12-'14hz'$ ), maximal frontocentral region.There is an excessive amount of 15 to 18 Hz beta activity distributed symmetrically and diffusely. Hyperventilation and photic stimulation were not performed.    ? ?ABNORMALITY ?- Excessive beta, generalized ? ?IMPRESSION: ?This study is within normal limits. The excessive beta activity seen in the background is most likely due to the effect of benzodiazepine and is a benign EEG pattern. No seizures or epileptiform discharges were seen throughout the recording. ? ?Lora Havens  ? ?

## 2021-09-19 NOTE — Progress Notes (Signed)
?PROGRESS NOTE ? ? ?Stacy Dalton  DTO:[id number] DOB: 02-11-1951 DOA: 09/17/2021 ?PCP: [name], FNP  ? ?Chief Complaint  ?Patient presents with  ? Alcohol Problem  ? ?Level of care: Stepdown ? ?Brief Admission History:  ?a 71 y.o. female with medical history significant of alcohol abuse, COPD, depression, GERD, hypertension, hypothyroidism, and more presents ED with a chief complaint of alcohol withdrawal.  Patient reports she does not know why she is here but if she had to guess is because she drinks too much.  Is reported that her son took her alcohol away from her couple days ago and today she had seizure-like activity.  Patient is oriented to self place and time.  She reports she is interested in cutting back on drinking, but is not interested in total cessation.  She would like to go back to [place], as she does not feel like she can take care of herself at home anymore.  Patient has no other complaints at this time.  Pt has had severe alcohol withdrawal in the past requiring ICU care.   ? ?09/18/2021:  Pt asking for more pain medication. Admits to heavy daily alcohol consumption.  Pt requesting placement at [facility].   ? ?09/19/2021: no further seizure activity. Actively being treated with CIWA protocol. Pt has history of severe withdrawal and being monitored in SDU. TOC consulted for placement at [facility] per patient request.  ?  ?Assessment and Plan: ?* Alcohol withdrawal ([place]) ?With seizure-like activity at home ?Continue Keppra, seizure precautions ?Patient reports she does not know how much she drinks a day, when she starts drinking she can stop, she does drink vodka ?CIWA protocol ?Continue to monitor ? ?Alcoholic ketoacidosis ?Patient had an anion gap of 14 ?Treating supportively with IV fluids ? ?Seizure ([place]) ?No Seizure activity reported in last 24 hours.  ?Continue Keppra 500 mg twice daily ?EEG pending.  ?Seizure most likely related to alcohol withdrawal ?Alcohol level less  than 10 ?Seizure precautions ?Continue to monitor ? ?Hypokalemia ?Improved from 2.2 to 3.0. Continue additional IV K runs.  ?Mg is 2.0.  ?Recheck BMP in AM.   ? ?Acute metabolic encephalopathy ?Likely related to withdrawal ?Somnolence likely related to lorazepam.  ?Unsure of baseline.   ?Continue to follow.  ? ?Hypothyroidism ?Continue Synthroid ? ?Hypertension ?Remains poorly controlled.  ?Continue Norvasc, add metoprolol 12.5 mg BID ? ?DVT prophylaxis: [place] heparin ?Code Status: Full  ?Family Communication:  ?Disposition: Status is: Inpatient ?Remains inpatient appropriate because: IV fluid, IV lorazepam, CIWA protocol ?  ?Consultants:  ? ?Procedures:  ?EEG pending  ?Antimicrobials:  ?  ?Subjective: ?Pt reports having abdominal pain.  ?Objective: ?Vitals:  ? 09/19/21 0600 09/19/21 0725 09/19/21 0800 09/19/21 1114  ?BP: (!) 170/83  (!) 150/70   ?Pulse: 93 95 95 97  ?Resp: 19 17  (!) 23  ?Temp:  100 ?F (37.8 ?C)  98.4 ?F (36.9 ?C)  ?TempSrc:  Oral  Oral  ?SpO2: 97% 98%  100%  ?Weight:      ?Height:      ? ? ?Intake/Output Summary (Last 24 hours) at 09/19/2021 1155 ?Last data filed at 09/19/2021 9833 ?Gross per 24 hour  ?Intake 2557.88 ml  ?Output 1200 ml  ?Net 1357.88 ml  ? ?Filed Weights  ? 09/17/21 1940 09/19/21 0425  ?Weight: 53.5 kg 58.2 kg  ? ?Examination: ? ?General exam: frail, emaciated, chronically ill appearing, Appears calm and comfortable  ?Respiratory system: Clear to auscultation. Respiratory effort normal. ?Cardiovascular system: normal  S1 & S2 heard. No JVD, murmurs, rubs, gallops or clicks. No pedal edema. ?Gastrointestinal system: Abdomen is nondistended, soft and nontender. No organomegaly or masses felt. Normal bowel sounds heard. ?Central nervous system: Alert and oriented. No focal neurological deficits. ?Extremities: Symmetric 5 x 5 power. ?Skin: No rashes, lesions or ulcers. ?Psychiatry: Judgement and insight appear poor. Mood & affect flat.  ? ?Data Reviewed: I have personally reviewed following  labs and imaging studies ? ?CBC: ?Recent Labs  ?Lab 09/17/21 ?2041 09/18/21 ?3903  ?WBC 6.4 6.2  ?NEUTROABS 4.2 3.6  ?HGB 11.0* 10.6*  ?HCT 35.2* 33.9*  ?MCV 85.4 82.1  ?PLT 191 156  ? ? ?Basic Metabolic Panel: ?Recent Labs  ?Lab 09/17/21 ?2041 09/18/21 ?0092 09/19/21 ?3300  ?NA 140 138 137  ?K 2.9* 3.2* 3.0*  ?CL 106 101 103  ?CO2 14* 24 24  ?GLUCOSE 173* 95 93  ?BUN 13 7* 6*  ?CREATININE 0.88 0.68 0.84  ?CALCIUM 7.5* 7.7* 7.5*  ?MG  --  1.2* 2.0  ? ? ?CBG: ?No results for input(s): GLUCAP in the last 168 hours. ? ?Recent Results (from the past 240 hour(s))  ?MRSA Next Gen by PCR, Nasal     Status: None  ? Collection Time: 09/18/21  2:58 PM  ? Specimen: Nasal Mucosa; Nasal Swab  ?Result Value Ref Range Status  ? MRSA by PCR Next Gen NOT DETECTED NOT DETECTED Final  ?  Comment: (NOTE) ?The GeneXpert MRSA Assay (FDA approved for NASAL specimens only), ?is one component of a comprehensive MRSA colonization surveillance ?program. It is not intended to diagnose MRSA infection nor to guide ?or monitor treatment for MRSA infections. ?Test performance is not FDA approved in patients less than 2 years ?old. ?Performed at [facility], [address] [place], [place] [address] ?  ?  ? ?Radiology Studies: ?CT Head Wo Contrast ? ?Result Date: 09/17/2021 ?CLINICAL DATA:  Dizziness, possibly ETOH withdrawal EXAM: CT HEAD WITHOUT CONTRAST TECHNIQUE: Contiguous axial images were obtained from the base of the skull through the vertex without intravenous contrast. RADIATION DOSE REDUCTION: This exam was performed according to the departmental dose-optimization program which includes automated exposure control, adjustment of the mA and/or kV according to patient size and/or use of iterative reconstruction technique. COMPARISON:  09/10/2021 FINDINGS: Brain: No evidence of acute infarction, hemorrhage, hydrocephalus, extra-axial collection or mass lesion/mass effect. Mild subcortical white matter and periventricular small vessel  ischemic changes. Vascular: No hyperdense vessel or unexpected calcification. Skull: Normal. Negative for fracture or focal lesion. Sinuses/Orbits: Mild mucosal thickening in the bilateral ethmoid sinuses. Partial opacification of the bilateral mastoid air cells, unchanged. Other: None. IMPRESSION: No evidence of acute intracranial abnormality. Mild small vessel ischemic changes. Partial bilateral mastoid effusions, unchanged. Electronically Signed   By: Julian Hy M.D.   On: 09/17/2021 21:44  ? ?DG Chest Port 1 View ? ?Result Date: 09/17/2021 ?CLINICAL DATA:  Shortness of breath. EXAM: PORTABLE CHEST 1 VIEW COMPARISON:  09/10/2021, CT 08/21/2021 FINDINGS: The cardiomediastinal contours are normal. Atherosclerosis of the thoracic aorta. Mild chronic bronchial thickening pulmonary vasculature is normal. No consolidation, pleural effusion, or pneumothorax. No acute osseous abnormalities are seen. IMPRESSION: No acute chest findings. Electronically Signed   By: Keith Rake M.D.   On: 09/17/2021 21:01   ? ?Scheduled Meds: ? amLODipine  10 mg Oral Daily  ? Chlorhexidine Gluconate Cloth  6 each Topical Daily  ? DULoxetine  60 mg Oral Daily  ? folic acid  1 mg Oral Daily  ?  heparin  5,000 Units Subcutaneous Q8H  ? levothyroxine  50 mcg Oral QAC breakfast  ? LORazepam  0-4 mg Intravenous Q6H  ? Followed by  ? [START ON 09/20/2021] LORazepam  0-4 mg Intravenous Q12H  ? multivitamin with minerals  1 tablet Oral Daily  ? pantoprazole  40 mg Oral BID  ? thiamine  100 mg Oral Daily  ? Or  ? thiamine  100 mg Intravenous Daily  ? ?Continuous Infusions: ? sodium chloride 65 mL/hr at 09/19/21 0740  ? levETIRAcetam 500 mg (09/19/21 0742)  ? potassium chloride 10 mEq (09/19/21 1105)  ? ? ? LOS: 2 days  ? ?Time spent: 36 mins ? ?Irwin Brakeman, MD ?How to contact the [facility] Attending or Consulting provider [place] or covering provider during after hours [place], for this patient?  ?Check the care team in [facility] and look for a)  attending/consulting TRH provider listed and b) the [facility] team listed ?Log into [email or web address] and use [place]'s universal password to access. If you do not have the password, please contact the hospital operat

## 2021-09-19 NOTE — TOC Initial Note (Signed)
Transition of Care (TOC) - Initial/Assessment Note  ? ? ?Patient Details  ?Name: Stacy Dalton ?MRN: [id number] ?Date of Birth: [birth year] ? ?Transition of Care (TOC) CM/SW Contact:    ?Stacy Lucks, RN ?Phone Number: ?09/19/2021, 2:19 PM ? ?Clinical Narrative:        Patient admitted with alcohol withdrawal.  TOC consulted for Substance Abuse. Well know to [facility], refused and given resources in the past.  TOC called her son. He wants the resource list. TOC will provide to him, he is a truck driver and not in the area. He state he talked to her by phone and she was talking about children being in the house. One day said she was talking to [facility], he ask if [name] her sister was there, she said NO, [name], [name] is here sitting on my bed.  He then went to check on her, took all day to clean her house and found 30 gallons of [place]. and took out a  city Optometrist.  He states she needs help!  She was at [facility] in [place]. Son is asking if she can go to SNF and give him a chance to work on Alcohol rehab. TOC updated the team.   ? ?Expected Discharge Plan: [place] ?Barriers to Discharge: Continued Medical Work up ? ?Patient Goals and CMS Choice ?Patient states their goals for this hospitalization and ongoing recovery are:: unsure at this time. ?CMS Medicare.gov Compare Post Acute Care list provided to:: Patient Represenative (must comment) ?Choice offered to / list presented to : Adult Children ? ?Expected Discharge Plan and Services ?Expected Discharge Plan: [place] ?  ?  ?Living arrangements for the past 2 months: [place] ?                ?   ?Prior Living Arrangements/Services ?Living arrangements for the past 2 months: [place] ?Lives with:: Self ?Patient language and need for interpreter reviewed:: Yes ?       ?Need for Family Participation in Patient Care: Yes (Comment) ?Care giver support system in place?: No (comment) ?Current  home services: DME ?Criminal Activity/Legal Involvement Pertinent to Current Situation/Hospitalization: No - Comment as needed ? ?Activities of Daily Living ?Home Assistive Devices/Equipment: [place]ford Rile (specify type) ?ADL Screening (condition at time of admission) ?Patient's cognitive ability adequate to safely complete daily activities?: Yes ?Is the patient deaf or have difficulty hearing?: No ?Does the patient have difficulty seeing, even when wearing glasses/contacts?: No ?Does the patient have difficulty concentrating, remembering, or making decisions?: No ?Patient able to express need for assistance with ADLs?: Yes ?Does the patient have difficulty dressing or bathing?: No ?Independently performs ADLs?: No ?Communication: Independent ?Dressing (OT): Independent with device (comment) ?Grooming: Independent with device (comment) ?Feeding: Independent ?Bathing: Independent ?Toileting: Independent with device (comment) ?In/Out Bed: Independent with device (comment) ?Walks in Home: Independent with device (comment) ?Does the patient have difficulty walking or climbing stairs?: Yes ?Weakness of Legs: Both ?Weakness of Arms/Hands: Both ? ?Permission Sought/Granted ? Emotional Assessment ?  ?   ?Alcohol / Substance Use: Alcohol Use ?Psych Involvement: No (comment) ? ?Admission diagnosis:  Alcohol withdrawal ([place]) [F10.939] ?Seizure ([place]) [R56.9] ?Alcohol withdrawal syndrome with complication ([place]) [Y07.371] ?Patient Active Problem List  ? Diagnosis Date Noted  ? Alcoholic ketoacidosis 12/27/9483  ? Hypertensive urgency 08/27/2021  ? Esophagitis 08/27/2021  ? Atypical chest pain 08/27/2021  ? Seizure ([place]) 06/06/2021  ? DTs (delirium tremens) ([place]) 06/06/2021  ?  AKI (acute kidney injury) ([place]) 05/19/2021  ? Elevated LFTs 05/19/2021  ? Metabolic acidosis 49/70/2637  ? COVID-19 virus infection 04/15/2021  ?  Class: Acute  ? Hypotensive episode 04/15/2021  ?  Class: Acute  ? Physical deconditioning   ? Vitamin B12 deficiency  02/04/2021  ? Leukocytosis 11/23/2020  ? Alcohol use disorder, severe, dependence ([place]) 10/08/2020  ? Hyponatremia 09/17/2020  ? Alcohol withdrawal ([place]) 09/12/2020  ? Moderate protein malnutrition ([place]) 08/20/2020  ? Elevated SGOT ([facility]) 08/19/2020  ? Elevated troponin 08/19/2020  ? Increased serum lipase level 08/19/2020  ? Alcohol abuse 06/21/2020  ? Large bowel obstruction (HCC)   ? Small bowel obstruction ([place]) 07/20/2018  ? Hypomagnesemia 07/20/2018  ? Polypharmacy 02/23/2017  ? Hypokalemia 02/23/2017  ? Acute metabolic encephalopathy 85/88/5027  ? Anxiety 02/22/2017  ? Altered mental status   ? Pain medication agreement signed 10/10/2016  ? Opioid dependence ([place]) 10/10/2016  ? Status post colostomy takedown 04/12/2016  ? Chronic constipation 12/03/2015  ? Peritonitis with abscess of intestine ([place]) 11/29/2015  ? COPD exacerbation ([place]) 09/20/2015  ? Osteopenia 06/10/2014  ? Vitamin D deficiency 06/10/2014  ? Hypothyroidism   ? Depression 11/18/2012  ? Insomnia 11/18/2012  ? Chronic pain syndrome 11/18/2012  ? HLD (hyperlipidemia) 09/01/2012  ? S/P colostomy ([place]) 04/19/2012  ? Normocytic anemia 10/15/2011  ? Hypertension 10/13/2011  ? Chronic back pain 10/13/2011  ? COPD (chronic obstructive pulmonary disease) ([place]) 10/09/2011  ? ?PCP:  [name], FNP ?Pharmacy:   ?[id number] - [place], [place] - [address] ?[address] ?[place] [address] ?Phone: [phone] Fax: [phone] ? ? ?Readmission Risk Interventions ?Readmission Risk Prevention Plan 09/19/2021 08/28/2021 06/08/2021  ?Transportation Screening Complete - Complete  ?Medication Review Press photographer) Complete Complete Complete  ?PCP or Specialist appointment within 3-5 days of discharge Not Complete - -  ?[place] or [place] Complete Complete Complete  ?SW Recovery Care/Counseling Consult - Complete Complete  ?Palliative Care Screening - Not Applicable Not Applicable   ?[place] - (No Data) Not Applicable  ?Some recent data might be hidden  ? ? ? ?

## 2021-09-19 NOTE — Progress Notes (Signed)
EEG complete - results pending 

## 2021-09-20 ENCOUNTER — Ambulatory Visit (INDEPENDENT_AMBULATORY_CARE_PROVIDER_SITE_OTHER): Payer: TRICARE For Life (TFL) | Admitting: Gastroenterology

## 2021-09-20 ENCOUNTER — Encounter (INDEPENDENT_AMBULATORY_CARE_PROVIDER_SITE_OTHER): Payer: Self-pay | Admitting: Gastroenterology

## 2021-09-20 DIAGNOSIS — I10 Essential (primary) hypertension: Secondary | ICD-10-CM | POA: Diagnosis not present

## 2021-09-20 DIAGNOSIS — E8729 Other acidosis: Secondary | ICD-10-CM | POA: Diagnosis not present

## 2021-09-20 DIAGNOSIS — F10930 Alcohol use, unspecified with withdrawal, uncomplicated: Secondary | ICD-10-CM | POA: Diagnosis not present

## 2021-09-20 DIAGNOSIS — G9341 Metabolic encephalopathy: Secondary | ICD-10-CM | POA: Diagnosis not present

## 2021-09-20 LAB — BASIC METABOLIC PANEL
Anion gap: 9 (ref 5–15)
BUN: 7 mg/dL — ABNORMAL LOW (ref 8–23)
CO2: 23 mmol/L (ref 22–32)
Calcium: 8.1 mg/dL — ABNORMAL LOW (ref 8.9–10.3)
Chloride: 105 mmol/L (ref 98–111)
Creatinine, Ser: 0.79 mg/dL (ref 0.44–1.00)
GFR, Estimated: >60 mL/min (ref >60)
Glucose, Bld: 127 mg/dL — ABNORMAL HIGH (ref 70–99)
Potassium: 3.7 mmol/L (ref 3.5–5.1)
Sodium: 137 mmol/L (ref 135–145)

## 2021-09-20 LAB — CBC
HCT: 31.2 % — ABNORMAL LOW (ref 36.0–46.0)
Hemoglobin: 9.7 g/dL — ABNORMAL LOW (ref 12.0–15.0)
MCH: 26.4 pg (ref 26.0–34.0)
MCHC: 31.1 g/dL (ref 30.0–36.0)
MCV: 85 fL (ref 80.0–100.0)
Platelets: 120 10*3/uL — ABNORMAL LOW (ref 150–400)
RBC: 3.67 MIL/uL — ABNORMAL LOW (ref 3.87–5.11)
RDW: 17.7 % — ABNORMAL HIGH (ref 11.5–15.5)
WBC: 5.4 10*3/uL (ref 4.0–10.5)
nRBC: 0 % (ref 0.0–0.2)

## 2021-09-20 LAB — MAGNESIUM: Magnesium: 1.7 mg/dL (ref 1.7–2.4)

## 2021-09-20 MED ORDER — FOLIC ACID 1 MG PO TABS
1.0000 mg | ORAL_TABLET | Freq: Every day | ORAL | Status: DC
Start: 2021-09-20 — End: 2021-09-23
  Administered 2021-09-21 – 2021-09-22 (×2): 1 mg via ORAL
  Filled 2021-09-20 (×3): qty 1

## 2021-09-20 MED ORDER — HYDRALAZINE HCL 20 MG/ML IJ SOLN: 10.0000 mg | INTRAMUSCULAR | Status: DC | PRN

## 2021-09-20 MED ORDER — MAGNESIUM SULFATE 2 GM/50ML IV SOLN
2.0000 g | Freq: Once | INTRAVENOUS | Status: AC
  Administered 2021-09-20: 2 g via INTRAVENOUS
  Filled 2021-09-20: qty 50

## 2021-09-20 MED ORDER — FOLIC ACID 5 MG/ML IJ SOLN
1.0000 mg | Freq: Every day | INTRAMUSCULAR | Status: DC
  Administered 2021-09-20 – 2021-09-23 (×2): 1 mg via INTRAVENOUS
  Filled 2021-09-20 (×2): qty 0.2

## 2021-09-20 MED ORDER — PANTOPRAZOLE SODIUM 40 MG IV SOLR
40.0000 mg | Freq: Two times a day (BID) | INTRAVENOUS | Status: DC
Start: 2021-09-20 — End: 2021-09-23
  Administered 2021-09-20 – 2021-09-23 (×7): 40 mg via INTRAVENOUS
  Filled 2021-09-20 (×7): qty 10

## 2021-09-20 NOTE — Plan of Care (Signed)
?  Problem: Acute Rehab PT Goals(only PT should resolve) ?Goal: Pt Will Go Supine/Side To Sit ?Outcome: Progressing ?Flowsheets (Taken 09/20/2021 1451) ?Pt will go Supine/Side to Sit: ? with supervision ? with min guard assist ?Goal: Patient Will Transfer Sit To/From Stand ?Outcome: Progressing ?Flowsheets (Taken 09/20/2021 1451) ?Patient will transfer sit to/from stand: ? with min guard assist ? with minimal assist ?Goal: Pt Will Transfer Bed To Chair/Chair To Bed ?Outcome: Progressing ?Flowsheets (Taken 09/20/2021 1451) ?Pt will Transfer Bed to Chair/Chair to Bed: ? min guard assist ? with min assist ?Goal: Pt Will Ambulate ?Outcome: Progressing ?Flowsheets (Taken 09/20/2021 1451) ?Pt will Ambulate: ? 50 feet ? with min guard assist ? with minimal assist ? with rolling walker ?  ?2:52 PM, 09/20/21 ?Lonell Grandchild, MPT ?Physical Therapist with [place] ?[facility] ?[phone] office ?[phone] mobile phone ? ?

## 2021-09-20 NOTE — [place] FL2 (Signed)
?[place] MEDICAID FL2 LEVEL OF CARE SCREENING TOOL  ?  ? ?IDENTIFICATION  ?Patient Name: ?Stacy Dalton Birthdate: May 29, 1951 Sex: female Admission Date (Current Location): ?09/17/2021  ?[place] and [place] Number: ? [place] and Address:  ?[facility] [address] [place] ?     Provider Number: ?[id number]  ?Attending Physician Name and Address:  ?Murlean Iba, MD ? Relative Name and Phone Number:  ?  ?   ?Current Level of Care: ?Hospital Recommended Level of Care: ?[place] Prior Approval Number: ?  ? ?Date Approved/Denied: ?  PASRR Number: ?[id number] A ? ?Discharge Plan: ?SNF ?  ? ?Current Diagnoses: ?Patient Active Problem List  ? Diagnosis Date Noted  ? Alcoholic ketoacidosis 58/03/9832  ? Hypertensive urgency 08/27/2021  ? Esophagitis 08/27/2021  ? Atypical chest pain 08/27/2021  ? Seizure ([place]) 06/06/2021  ? DTs (delirium tremens) ([place]) 06/06/2021  ? AKI (acute kidney injury) ([place]) 05/19/2021  ? Elevated LFTs 05/19/2021  ? Metabolic acidosis 82/50/5397  ? COVID-19 virus infection 04/15/2021  ? Hypotensive episode 04/15/2021  ? Physical deconditioning   ? Vitamin B12 deficiency 02/04/2021  ? Leukocytosis 11/23/2020  ? Alcohol use disorder, severe, dependence ([place]) 10/08/2020  ? Hyponatremia 09/17/2020  ? Alcohol withdrawal ([place]) 09/12/2020  ? Moderate protein malnutrition ([place]) 08/20/2020  ? Elevated SGOT ([facility]) 08/19/2020  ? Elevated troponin 08/19/2020  ? Increased serum lipase level 08/19/2020  ? Alcohol abuse 06/21/2020  ? Large bowel obstruction (HCC)   ? Small bowel obstruction ([place]) 07/20/2018  ? Hypomagnesemia 07/20/2018  ? Polypharmacy 02/23/2017  ? Hypokalemia 02/23/2017  ? Acute metabolic encephalopathy 67/34/1937  ? Anxiety 02/22/2017  ? Altered mental status   ? Pain medication agreement signed 10/10/2016  ? Opioid dependence ([place]) 10/10/2016  ? Status post colostomy takedown 04/12/2016  ? Chronic constipation 12/03/2015  ?  Peritonitis with abscess of intestine ([place]) 11/29/2015  ? COPD exacerbation ([place]) 09/20/2015  ? Osteopenia 06/10/2014  ? Vitamin D deficiency 06/10/2014  ? Hypothyroidism   ? Depression 11/18/2012  ? Insomnia 11/18/2012  ? Chronic pain syndrome 11/18/2012  ? HLD (hyperlipidemia) 09/01/2012  ? S/P colostomy ([place]) 04/19/2012  ? Normocytic anemia 10/15/2011  ? Hypertension 10/13/2011  ? Chronic back pain 10/13/2011  ? COPD (chronic obstructive pulmonary disease) ([place]) 10/09/2011  ? ? ?Orientation RESPIRATION BLADDER Height & Weight   ?  ?Self ? Normal External catheter Weight: 56.9 kg ?Height:  '5\' 1"'$  (154.9 cm)  ?BEHAVIORAL SYMPTOMS/MOOD NEUROLOGICAL BOWEL NUTRITION STATUS  ?    Continent Diet (See DC summary)  ?AMBULATORY STATUS COMMUNICATION OF NEEDS Skin   ?Extensive Assist Verbally Bruising ?  ?  ?  ?    ?     ?     ? ? ?Personal Care Assistance Level of Assistance  ?Feeding, Dressing, Bathing Bathing Assistance: Maximum assistance ?Feeding assistance: Limited assistance ?Dressing Assistance: Maximum assistance ?   ? ?Functional Limitations Info  ?Sight, Hearing, Speech Sight Info: Adequate ?Hearing Info: Adequate ?Speech Info: Adequate  ? ? ?SPECIAL CARE FACTORS FREQUENCY  ?PT (By licensed PT)   ?  ?PT Frequency: 5 times a week ?  ?  ?  ?  ?   ? ? ?Contractures Contractures Info: Not present  ? ? ?Additional Factors Info  ?Code Status, Allergies Code Status Info: Full ?Allergies Info: Librium, Troadol, tromethamine, butalbital-apap-caffeine,demerol, Esgic, iodine, pyridium ?  ?  ?  ?   ? ?Current Medications (09/20/2021):  This is the current hospital  active medication list ?Current Facility-Administered Medications  ?Medication Dose Route Frequency Provider Last Rate Last Admin  ? 0.9 %  sodium chloride infusion   Intravenous Continuous Wynetta Emery, Clanford L, MD 65 mL/hr at 09/19/21 2119 New Bag at 09/19/21 2119  ? acetaminophen (TYLENOL) tablet 650 mg  650 mg Oral Q6H PRN Zierle-Ghosh, Asia B, DO      ? Or  ?  acetaminophen (TYLENOL) suppository 650 mg  650 mg Rectal Q6H PRN Zierle-Ghosh, Asia B, DO      ? albuterol (VENTOLIN HFA) 108 (90 Base) MCG/ACT inhaler 2 puff  2 puff Inhalation Q4H PRN Zierle-Ghosh, Asia B, DO      ? amLODipine (NORVASC) tablet 10 mg  10 mg Oral Daily Zierle-Ghosh, Asia B, DO   10 mg at 09/19/21 1045  ? Chlorhexidine Gluconate Cloth 2 % PADS 6 each  6 each Topical Daily Zierle-Ghosh, Asia B, DO   6 each at 09/20/21 1100  ? dexmedetomidine (PRECEDEX) 400 MCG/100ML (4 mcg/mL) infusion  0.4-1.2 mcg/kg/hr Intravenous Titrated Zierle-Ghosh, Asia B, DO 7.28 mL/hr at 09/20/21 0738 0.5 mcg/kg/hr at 09/20/21 0738  ? DULoxetine (CYMBALTA) DR capsule 60 mg  60 mg Oral Daily Zierle-Ghosh, Asia B, DO   60 mg at 09/20/21 1220  ? folic acid (FOLVITE) tablet 1 mg  1 mg Oral Daily Johnson, Clanford L, MD      ? Or  ? folic acid injection 1 mg  1 mg Intravenous Daily Johnson, Clanford L, MD   1 mg at 09/20/21 1223  ? heparin injection 5,000 Units  5,000 Units Subcutaneous Q8H Zierle-Ghosh, Asia B, DO   5,000 Units at 09/20/21 8657  ? hydrALAZINE (APRESOLINE) injection 10 mg  10 mg Intravenous Q4H PRN Johnson, Clanford L, MD      ? hydrOXYzine (ATARAX) tablet 50 mg  50 mg Oral TID PRN Zierle-Ghosh, Asia B, DO      ? levETIRAcetam (KEPPRA) IVPB 500 mg/100 mL premix  500 mg Intravenous Q12H Johnson, Clanford L, MD 400 mL/hr at 09/20/21 0730 500 mg at 09/20/21 0730  ? levothyroxine (SYNTHROID) tablet 50 mcg  50 mcg Oral QAC breakfast Zierle-Ghosh, Asia B, DO   50 mcg at 09/20/21 8469  ? LORazepam (ATIVAN) injection 0-4 mg  0-4 mg Intravenous Q6H Johnson, Clanford L, MD   2 mg at 09/19/21 2006  ? Followed by  ? LORazepam (ATIVAN) injection 0-4 mg  0-4 mg Intravenous Q12H Johnson, Clanford L, MD      ? LORazepam (ATIVAN) tablet 1-4 mg  1-4 mg Oral Q1H PRN Murlean Iba, MD      ? Or  ? LORazepam (ATIVAN) injection 1-4 mg  1-4 mg Intravenous Q1H PRN Johnson, Clanford L, MD   2 mg at 09/19/21 2037  ? magnesium  sulfate IVPB 2 g 50 mL  2 g Intravenous Once Wynetta Emery, Clanford L, MD 50 mL/hr at 09/20/21 1241 2 g at 09/20/21 1241  ? metoprolol tartrate (LOPRESSOR) tablet 12.5 mg  12.5 mg Oral BID Johnson, Clanford L, MD      ? morphine (PF) 2 MG/ML injection 1 mg  1 mg Intravenous Q3H PRN Johnson, Clanford L, MD      ? multivitamin with minerals tablet 1 tablet  1 tablet Oral Daily Zierle-Ghosh, Asia B, DO   1 tablet at 09/20/21 1220  ? ondansetron (ZOFRAN) tablet 4 mg  4 mg Oral Q6H PRN Zierle-Ghosh, Asia B, DO      ? Or  ? ondansetron (ZOFRAN) injection 4  mg  4 mg Intravenous Q6H PRN Zierle-Ghosh, Asia B, DO   4 mg at 09/18/21 1502  ? oxyCODONE (Oxy IR/ROXICODONE) immediate release tablet 5 mg  5 mg Oral Q4H PRN Zierle-Ghosh, Asia B, DO   5 mg at 09/18/21 1711  ? pantoprazole (PROTONIX) injection 40 mg  40 mg Intravenous Q12H Johnson, Clanford L, MD   40 mg at 09/20/21 1220  ? polyethylene glycol (MIRALAX / GLYCOLAX) packet 17 g  17 g Oral Daily PRN Zierle-Ghosh, Asia B, DO      ? thiamine tablet 100 mg  100 mg Oral Daily Zierle-Ghosh, Asia B, DO   100 mg at 09/19/21 1045  ? Or  ? thiamine (B-1) injection 100 mg  100 mg Intravenous Daily Zierle-Ghosh, Asia B, DO   100 mg at 09/20/21 1225  ? ? ? ?Discharge Medications: ?Please see discharge summary for a list of discharge medications. ? ?Relevant Imaging Results: ? ?Relevant Lab Results: ? ? ?Additional Information ?[phone] ? ?Boneta Lucks, RN ? ? ? ? ?

## 2021-09-20 NOTE — TOC Progression Note (Addendum)
Transition of Care (TOC) - Progression Note  ? ? ?Patient Details  ?Name: Stacy Dalton ?MRN: [id number] ?Date of Birth: [birth year] ? ?Transition of Care (TOC) CM/SW Contact  ?Boneta Lucks, RN ?Phone Number: ?09/20/2021, 1:36 PM ? ?Clinical Narrative:   PT is recommending SNF. TOC spoke with her son, [name]. He is in agreement. FL2 completed and sent out for bed offers.  ? ?Patient was to appear in [place] tomorrow. Son ask TOC to fax a letter to clerk of court at [phone] with ref to case numbers [id number] and [id number]. TOC printed a letter and faxed.  ? ?Sent son picture of [employer] as requested.  ? ? ?Expected Discharge Plan: [place] ?Barriers to Discharge: Continued Medical Work up ? ?Expected Discharge Plan and Services ?Expected Discharge Plan: [place] ?  ?   ?Living arrangements for the past 2 months: [place] ?                ?  ?Readmission Risk Interventions ?Readmission Risk Prevention Plan 09/19/2021 08/28/2021 06/08/2021  ?Transportation Screening Complete - Complete  ?Medication Review Press photographer) Complete Complete Complete  ?PCP or Specialist appointment within 3-5 days of discharge Not Complete - -  ?[place] or [place] Complete Complete Complete  ?SW Recovery Care/Counseling Consult - Complete Complete  ?Palliative Care Screening - Not Applicable Not Applicable  ?[place] - (No Data) Not Applicable  ?Some recent data might be hidden  ? ? ?

## 2021-09-20 NOTE — Plan of Care (Signed)

## 2021-09-20 NOTE — Progress Notes (Addendum)
?PROGRESS NOTE ? ? ?Stacy Dalton  YNW:[id number] DOB: 10-20-1950 DOA: 09/17/2021 ?PCP: [name], FNP  ? ?Chief Complaint  ?Patient presents with  ? Alcohol Problem  ? ?Level of care: ICU ? ?Brief Admission History:  ?a 71 y.o. female with medical history significant of alcohol abuse, COPD, depression, GERD, hypertension, hypothyroidism, and more presents ED with a chief complaint of alcohol withdrawal.  Patient reports she does not know why she is here but if she had to guess is because she drinks too much.  Is reported that her son took her alcohol away from her couple days ago and today she had seizure-like activity.  Patient is oriented to self place and time.  She reports she is interested in cutting back on drinking, but is not interested in total cessation.  She would like to go back to [facility], as she does not feel like she can take care of herself at home anymore.  Patient has no other complaints at this time.  Pt has had severe alcohol withdrawal in the past requiring ICU care.   ? ?09/18/2021:  Pt asking for more pain medication. Admits to heavy daily alcohol consumption.  Pt requesting placement at [facility].   ? ?09/19/2021: no further seizure activity. Actively being treated with CIWA protocol. Pt has history of severe withdrawal and being monitored in SDU. TOC consulted for placement at [facility] per patient request.  ? ?09/20/2021: Pt became severely agitated in early morning hours and ended up being started on precedex infusion. Remains in ICU on precedex at this time.  Family reporting large amounts of chronic alcohol abuse at home.  ?  ?Assessment and Plan: ?* Alcohol withdrawal ([place]) ?With seizure-like activity at home ?Continue Keppra, seizure precautions ?Patient reports she does not know how much she drinks a day, when she starts drinking she cannot stop, she does drink vodka and gallon jugs of vodka found at her home per son's report.  ?CIWA protocol failed and she is now on  precedex infusion ?Continue to monitor ? ?Alcoholic ketoacidosis ?Patient had an anion gap of 14 ?Treating supportively with IV fluids ? ?Seizure ([place]) ?Suspect this was an alcohol withdrawal seizure.  ?No Seizure activity reported in last 48 hours.  ?EEG with no epileptiform activity.   Go ahead and DC keppra.  ?Alcohol level less than 10 on admission ?Seizure precautions ?Continue to monitor ? ?Hypokalemia ?Improved from 2.2 to 3.7. Pt was treated with IV K runs.  ?Mg is trending back down. Give additional 2 g Mg sulfate today.  ? ? ?Acute metabolic encephalopathy ?Likely related to withdrawal ?Unsure of baseline.   ?Continue to follow.  ? ?Hypothyroidism ?Continue Synthroid ? ?Hypertension ?Remains poorly controlled.  ?Continue Norvasc, add metoprolol 12.5 mg BID ?Adding hydralazine IV PRN SBP>160 ? ?DVT prophylaxis: [place] heparin ?Code Status: Full  ?Family Communication:  ?Disposition: Status is: Inpatient ?Remains inpatient appropriate because: IV fluid, IV lorazepam, CIWA protocol ?  ?Consultants:  ? ?Procedures:  ?EEG pending  ?Antimicrobials:  ?  ?Subjective: ?Pt sedated on precedex infusion.  ?Objective: ?Vitals:  ? 09/20/21 0740 09/20/21 0800 09/20/21 0900 09/20/21 1125  ?BP:  (!) 167/83 (!) 181/79 (!) 170/86  ?Pulse:  61 65 61  ?Resp:  '17 18 19  '$ ?Temp: 97.9 ?F (36.6 ?C)   (!) 97.5 ?F (36.4 ?C)  ?TempSrc: Oral   Axillary  ?SpO2:  94% 94% 94%  ?Weight:      ?Height:      ? ? ?Intake/Output  Summary (Last 24 hours) at 09/20/2021 1237 ?Last data filed at 09/20/2021 1218 ?Gross per 24 hour  ?Intake 1909.25 ml  ?Output 1550 ml  ?Net 359.25 ml  ? ?Filed Weights  ? 09/17/21 1940 09/19/21 0425 09/20/21 0500  ?Weight: 53.5 kg 58.2 kg 56.9 kg  ? ?Examination: ? ?General exam: frail, emaciated, chronically ill appearing, Appears calm and comfortable  ?Respiratory system: Clear to auscultation. Respiratory effort normal. ?Cardiovascular system: normal S1 & S2 heard. No JVD, murmurs, rubs, gallops or clicks. No pedal  edema. ?Gastrointestinal system: Abdomen is nondistended, soft and nontender. No organomegaly or masses felt. Normal bowel sounds heard. ?Central nervous system: Alert and oriented. No focal neurological deficits. ?Extremities: Symmetric 5 x 5 power. ?Skin: No rashes, lesions or ulcers. ?Psychiatry: Judgement and insight appear poor. Mood & affect flat.  ? ?Data Reviewed: I have personally reviewed following labs and imaging studies ? ?CBC: ?Recent Labs  ?Lab 09/17/21 ?2041 09/18/21 ?5284 09/20/21 ?0505  ?WBC 6.4 6.2 5.4  ?NEUTROABS 4.2 3.6  --   ?HGB 11.0* 10.6* 9.7*  ?HCT 35.2* 33.9* 31.2*  ?MCV 85.4 82.1 85.0  ?PLT 191 156 120*  ? ? ?Basic Metabolic Panel: ?Recent Labs  ?Lab 09/17/21 ?2041 09/18/21 ?1324 09/19/21 ?4010 09/20/21 ?0423  ?NA 140 138 137 137  ?K 2.9* 3.2* 3.0* 3.7  ?CL 106 101 103 105  ?CO2 14* '24 24 23  '$ ?GLUCOSE 173* 95 93 127*  ?BUN 13 7* 6* 7*  ?CREATININE 0.88 0.68 0.84 0.79  ?CALCIUM 7.5* 7.7* 7.5* 8.1*  ?MG  --  1.2* 2.0 1.7  ? ? ?CBG: ?No results for input(s): GLUCAP in the last 168 hours. ? ?Recent Results (from the past 240 hour(s))  ?MRSA Next Gen by PCR, Nasal     Status: None  ? Collection Time: 09/18/21  2:58 PM  ? Specimen: Nasal Mucosa; Nasal Swab  ?Result Value Ref Range Status  ? MRSA by PCR Next Gen NOT DETECTED NOT DETECTED Final  ?  Comment: (NOTE) ?The GeneXpert MRSA Assay (FDA approved for NASAL specimens only), ?is one component of a comprehensive MRSA colonization surveillance ?program. It is not intended to diagnose MRSA infection nor to guide ?or monitor treatment for MRSA infections. ?Test performance is not FDA approved in patients less than 2 years ?old. ?Performed at [facility], [address] [place], [place] [address] ?  ?  ? ?Radiology Studies: ?EEG adult ? ?Result Date: 09/19/2021 ?Lora Havens, MD     09/19/2021  6:04 PM Patient Name: Stacy Dalton MRN: [id number] Epilepsy Attending: Lora Havens Referring Physician/Provider: Rolla Plate, DO  Date: 09/19/2021 Duration: 22.14 mins Patient history: 71yo F with h/o seizures presented with alcohol withdrawal and seizure. EEG to evaluate for seizure Level of alertness: Awake, asleep AEDs during EEG study: LEV, ativan Technical aspects: This EEG study was done with scalp electrodes positioned according to the 10-20 International system of electrode placement. Electrical activity was acquired at a sampling rate of '500Hz'$  and reviewed with a high frequency filter of '70Hz'$  and a low frequency filter of '1Hz'$ . EEG data were recorded continuously and digitally stored. Description: The posterior dominant rhythm consists of 8 Hz activity of moderate voltage (25-35 uV) seen predominantly in posterior head regions, symmetric and reactive to eye opening and eye closing. Sleep was characterized by sleep spindles (12-'14hz'$ ), maximal frontocentral region.There is an excessive amount of 15 to 18 Hz beta activity distributed symmetrically and diffusely. Hyperventilation and photic stimulation were not performed.  ABNORMALITY - Excessive beta, generalized IMPRESSION: This study is within normal limits. The excessive beta activity seen in the background is most likely due to the effect of benzodiazepine and is a benign EEG pattern. No seizures or epileptiform discharges were seen throughout the recording. Priyanka Barbra Sarks   ? ?Scheduled Meds: ? amLODipine  10 mg Oral Daily  ? Chlorhexidine Gluconate Cloth  6 each Topical Daily  ? DULoxetine  60 mg Oral Daily  ? folic acid  1 mg Oral Daily  ? Or  ? folic acid  1 mg Intravenous Daily  ? heparin  5,000 Units Subcutaneous Q8H  ? levothyroxine  50 mcg Oral QAC breakfast  ? LORazepam  0-4 mg Intravenous Q6H  ? Followed by  ? LORazepam  0-4 mg Intravenous Q12H  ? metoprolol tartrate  12.5 mg Oral BID  ? multivitamin with minerals  1 tablet Oral Daily  ? pantoprazole (PROTONIX) IV  40 mg Intravenous Q12H  ? thiamine  100 mg Oral Daily  ? Or  ? thiamine  100 mg Intravenous Daily   ? ?Continuous Infusions: ? sodium chloride 65 mL/hr at 09/19/21 2119  ? dexmedetomidine (PRECEDEX) IV infusion 0.5 mcg/kg/hr (09/20/21 2876)  ? levETIRAcetam 500 mg (09/20/21 0730)  ? magnesium sulfate bolus IVPB    ? ?

## 2021-09-20 NOTE — Evaluation (Signed)
Physical Therapy Evaluation ?Patient Details ?Name: Stacy Dalton ?MRN: [id number] ?DOB: 1951-02-05 ?Today's Date: 09/20/2021 ? ?History of Present Illness ? Stacy Dalton is a 71 y.o. female with medical history significant of alcohol abuse, COPD, depression, GERD, hypertension, hypothyroidism, and more presents ED with a chief complaint of alcohol withdrawal.  Patient reports she does not know why she is here but if she had to guess is because she drinks too much.  Is reported that her son took her alcohol away from her couple days ago and today she had seizure-like activity.  Patient is oriented to self place and time.  She reports she is interested in cutting back on drinking, but is not interested in total cessation.  She would like to go back to [place], as she does not feel like she can take care of herself at home anymore.  Patient has no other complaints at this time. ?  ?Clinical Impression ? Patient presents slightly lethargic and able to participate with therapy appropriately.  Patient demonstrates slow labored movement for sitting up at bedside, unsteady on feet having to lean on nearby objects for support, safer using a RW and limited to a few steps at bedside due to generalized weakness and fatigue.  Patient tolerated sitting up in chair after therapy - nursing staff aware.  Patient will benefit from continued skilled physical therapy in hospital and recommended venue below to increase strength, balance, endurance for safe ADLs and gait.  ? ? ?   ? ?Recommendations for follow up therapy are one component of a multi-disciplinary discharge planning process, led by the attending physician.  Recommendations may be updated based on patient status, additional functional criteria and insurance authorization. ? ?Follow Up Recommendations Skilled nursing-short term rehab (<3 hours/day) ? ?  ?Assistance Recommended at Discharge Intermittent Supervision/Assistance  ?Patient can return home with the  following ? Assistance with cooking/housework;Help with stairs or ramp for entrance;A little help with bathing/dressing/bathroom;A lot of help with walking and/or transfers ? ?  ?Equipment Recommendations None recommended by PT  ?Recommendations for Other Services ?    ?  ?Functional Status Assessment Patient has had a recent decline in their functional status and demonstrates the ability to make significant improvements in function in a reasonable and predictable amount of time.  ? ?  ?Precautions / Restrictions Precautions ?Precautions: Fall ?Restrictions ?Weight Bearing Restrictions: No  ? ?  ? ?Mobility ? Bed Mobility ?Overal bed mobility: Needs Assistance ?Bed Mobility: Supine to Sit ?  ?  ?Supine to sit: Min assist, Mod assist ?  ?  ?General bed mobility comments: increased time, labored movement ?  ? ?Transfers ?Overall transfer level: Needs assistance ?Equipment used: Rollator (4 wheels) ?Transfers: Sit to/from Stand, Bed to chair/wheelchair/BSC ?Sit to Stand: Min assist ?  ?Step pivot transfers: Min assist ?  ?  ?  ?General transfer comment: slow labored movement ?  ? ?Ambulation/Gait ?Ambulation/Gait assistance: Min assist, Mod assist ?Gait Distance (Feet): 6 Feet ?Assistive device: Rolling walker (2 wheels) ?Gait Pattern/deviations: Decreased step length - right, Decreased step length - left, Decreased stride length ?Gait velocity: decreased ?  ?  ?General Gait Details: limited to few slow labored steps at bedside mostly due to generalized weakness and fatigue ? ?Stairs ?  ?  ?  ?  ?  ? ?Wheelchair Mobility ?  ? ?Modified Rankin (Stroke Patients Only) ?  ? ?  ? ?Balance Overall balance assessment: Needs assistance ?Sitting-balance support: Feet supported, No upper extremity supported ?Sitting balance-Leahy  Scale: Fair ?Sitting balance - Comments: fair/good seated at EOB ?  ?Standing balance support: During functional activity, No upper extremity supported ?Standing balance-Leahy Scale: Poor ?Standing  balance comment: fair using RW ?  ?  ?  ?  ?  ?  ?  ?  ?  ?  ?  ?   ? ? ? ?Pertinent Vitals/Pain Pain Assessment ?Pain Assessment: No/denies pain  ? ? ?Home Living Family/patient expects to be discharged to:: Private residence ?Living Arrangements: Alone ?Available Help at Discharge: Friend(s);Available PRN/intermittently ?Type of Home: House ?Home Access: Stairs to enter ?Entrance Stairs-Rails: None ?Entrance Stairs-Number of Steps: 1-2 ?  ?Home Layout: One level ?Home Equipment: Rolling Walker (2 wheels);BSC/3in1;Shower seat;Toilet riser;Cane - single point ?   ?  ?Prior Function Prior Level of Function : Needs assist ?  ?  ?  ?Physical Assist : Mobility (physical);ADLs (physical) ?Mobility (physical): Bed mobility;Transfers;Gait;Stairs ?  ?Mobility Comments: Hydrographic surveyor, drives rarely ?ADLs Comments: assisted by family/friends ?  ? ? ?Hand Dominance  ? Dominant Hand: Right ? ?  ?Extremity/Trunk Assessment  ? Upper Extremity Assessment ?Upper Extremity Assessment: Generalized weakness ?  ? ?Lower Extremity Assessment ?Lower Extremity Assessment: Generalized weakness ?  ? ?Cervical / Trunk Assessment ?Cervical / Trunk Assessment: Normal  ?Communication  ? Communication: No difficulties  ?Cognition Arousal/Alertness: Awake/alert, Lethargic, Suspect due to medications ?Behavior During Therapy: [facility] for tasks assessed/performed ?Overall Cognitive Status: Within Functional Limits for tasks assessed ?  ?  ?  ?  ?  ?  ?  ?  ?  ?  ?  ?  ?  ?  ?  ?  ?General Comments: slightly lethargic, but able to answer questions and follow directions appropriately ?  ?  ? ?  ?General Comments   ? ?  ?Exercises    ? ?Assessment/Plan  ?  ?PT Assessment Patient needs continued PT services  ?PT Problem List Decreased strength;Decreased activity tolerance;Decreased balance;Decreased mobility ? ?   ?  ?PT Treatment Interventions DME instruction;Stair training;Gait training;Functional mobility training;Therapeutic  activities;Therapeutic exercise;Balance training;Patient/family education   ? ?PT Goals (Current goals can be found in the Care Plan section)  ?Acute Rehab PT Goals ?Patient Stated Goal: return home with family/friends to assist ?PT Goal Formulation: With patient ?Time For Goal Achievement: 10/04/21 ?Potential to Achieve Goals: Good ? ?  ?Frequency Min 3X/week ?  ? ? ?Co-evaluation   ?  ?  ?  ?  ? ? ?  ?AM-PAC PT "6 Clicks" Mobility  ?Outcome Measure Help needed turning from your back to your side while in a flat bed without using bedrails?: A Little ?Help needed moving from lying on your back to sitting on the side of a flat bed without using bedrails?: A Lot ?Help needed moving to and from a bed to a chair (including a wheelchair)?: A Little ?Help needed standing up from a chair using your arms (e.g., wheelchair or bedside chair)?: A Little ?Help needed to walk in hospital room?: A Lot ?Help needed climbing 3-5 steps with a railing? : A Lot ?6 Click Score: 15 ? ?  ?End of Session   ?Activity Tolerance: Patient tolerated treatment well;Patient limited by fatigue ?Patient left: in chair;with call bell/phone within reach;with chair alarm set ?Nurse Communication: Mobility status ?PT Visit Diagnosis: Unsteadiness on feet (R26.81);Other abnormalities of gait and mobility (R26.89);Muscle weakness (generalized) (M62.81) ?  ? ?Time: 3976-7341 ?PT Time Calculation (min) (ACUTE ONLY): 28 min ? ? ?Charges:   PT Evaluation ?$PT Eval Moderate  Complexity: 1 Mod ?PT Treatments ?$Therapeutic Activity: 23-37 mins ?  ?   ? ? ?2:50 PM, 09/20/21 ?Lonell Grandchild, MPT ?Physical Therapist with [place] ?[facility] ?[phone] office ?[phone] mobile phone ? ? ?

## 2021-09-21 DIAGNOSIS — R0789 Other chest pain: Secondary | ICD-10-CM | POA: Diagnosis not present

## 2021-09-21 DIAGNOSIS — F10939 Alcohol use, unspecified with withdrawal, unspecified: Secondary | ICD-10-CM

## 2021-09-21 DIAGNOSIS — G9341 Metabolic encephalopathy: Secondary | ICD-10-CM | POA: Diagnosis not present

## 2021-09-21 DIAGNOSIS — K209 Esophagitis, unspecified without bleeding: Secondary | ICD-10-CM

## 2021-09-21 DIAGNOSIS — E8729 Other acidosis: Secondary | ICD-10-CM | POA: Diagnosis not present

## 2021-09-21 DIAGNOSIS — F1120 Opioid dependence, uncomplicated: Secondary | ICD-10-CM

## 2021-09-21 LAB — BASIC METABOLIC PANEL
Anion gap: 11 (ref 5–15)
BUN: 8 mg/dL (ref 8–23)
CO2: 23 mmol/L (ref 22–32)
Calcium: 8.6 mg/dL — ABNORMAL LOW (ref 8.9–10.3)
Chloride: 101 mmol/L (ref 98–111)
Creatinine, Ser: 0.72 mg/dL (ref 0.44–1.00)
GFR, Estimated: >60 mL/min (ref >60)
Glucose, Bld: 104 mg/dL — ABNORMAL HIGH (ref 70–99)
Potassium: 3.1 mmol/L — ABNORMAL LOW (ref 3.5–5.1)
Sodium: 135 mmol/L (ref 135–145)

## 2021-09-21 LAB — MAGNESIUM: Magnesium: 1.7 mg/dL (ref 1.7–2.4)

## 2021-09-21 MED ORDER — THIAMINE HCL 100 MG/ML IJ SOLN
500.0000 mg | Freq: Three times a day (TID) | INTRAVENOUS | Status: AC
  Administered 2021-09-21 – 2021-09-23 (×5): 500 mg via INTRAVENOUS
  Filled 2021-09-21 (×2): qty 5
  Filled 2021-09-21: qty 4
  Filled 2021-09-21 (×3): qty 5

## 2021-09-21 MED ORDER — LOSARTAN POTASSIUM 50 MG PO TABS
50.0000 mg | ORAL_TABLET | Freq: Every day | ORAL | Status: DC
  Administered 2021-09-21 – 2021-09-23 (×3): 50 mg via ORAL
  Filled 2021-09-21 (×3): qty 1

## 2021-09-21 MED ORDER — MAGNESIUM SULFATE 2 GM/50ML IV SOLN
2.0000 g | Freq: Once | INTRAVENOUS | Status: AC
  Administered 2021-09-21: 2 g via INTRAVENOUS
  Filled 2021-09-21: qty 50

## 2021-09-21 MED ORDER — VITAMIN B-12 100 MCG PO TABS
500.0000 ug | ORAL_TABLET | Freq: Every day | ORAL | Status: DC
  Administered 2021-09-21 – 2021-09-23 (×3): 500 ug via ORAL
  Filled 2021-09-21: qty 1
  Filled 2021-09-21: qty 5
  Filled 2021-09-21: qty 1

## 2021-09-21 MED ORDER — SUCRALFATE 1 GM/10ML PO SUSP
1.0000 g | Freq: Three times a day (TID) | ORAL | Status: DC
  Administered 2021-09-21 – 2021-09-23 (×7): 1 g via ORAL
  Filled 2021-09-21 (×7): qty 10

## 2021-09-21 MED ORDER — POTASSIUM CHLORIDE CRYS ER 20 MEQ PO TBCR
40.0000 meq | EXTENDED_RELEASE_TABLET | Freq: Once | ORAL | Status: AC
  Administered 2021-09-21: 40 meq via ORAL
  Filled 2021-09-21: qty 2

## 2021-09-21 MED ORDER — ORAL CARE MOUTH RINSE
15.0000 mL | Freq: Two times a day (BID) | OROMUCOSAL | Status: DC
  Administered 2021-09-21 – 2021-09-22 (×4): 15 mL via OROMUCOSAL

## 2021-09-21 NOTE — Assessment & Plan Note (Signed)
PDMP reviewed ?-Patient receives tramadol 50 mg #30 on 1/11 and 06/13/21 ?-intermittent ativan Rx, last on 12/28 ?

## 2021-09-21 NOTE — Progress Notes (Signed)
Precedex drip off at 1523. Patient has been alert and/or easy to arouse by verbal stimuli. Dr Tat aware of Precedex drip being off.  ?

## 2021-09-21 NOTE — Plan of Care (Signed)

## 2021-09-21 NOTE — Assessment & Plan Note (Addendum)
Continue pantoprazole ?Continue sulcrafate ?2/27 EGD--Grade C reflux Esophagitis (no bleeding), Schatzki ring, gastritis ?

## 2021-09-21 NOTE — Assessment & Plan Note (Signed)
This is a chronic issue ?Troponin 12>>14 ?Musculoskeletal and GI related ?2/27 Echo--EF 60-65%, no WMA, G1DD ?Personally reviewed EKG--sinus, nonspecific T wave change ?

## 2021-09-21 NOTE — Progress Notes (Signed)
?  ?       ?PROGRESS NOTE ? ?Stacy Dalton XBD:[id number] DOB: 06-14-1951 DOA: 09/17/2021 ?PCP: [name], FNP ? ?Brief History:  ?a 71 y.o. female with medical history significant of alcohol abuse, COPD, depression, GERD, hypertension, hypothyroidism, and more presents ED with a chief complaint of alcohol withdrawal.  Patient reports she does not know why she is here but if she had to guess is because she drinks too much.  Is reported that her son took her alcohol away from her couple days ago and today she had seizure-like activity.  Patient is oriented to self place and time.  She reports she is interested in cutting back on drinking, but is not interested in total cessation.  She would like to go back to [place], as she does not feel like she can take care of herself at home anymore.  Patient has no other complaints at this time.  Pt has had severe alcohol withdrawal in the past requiring ICU care.   ? ?09/18/2021:  Pt asking for more pain medication. Admits to heavy daily alcohol consumption.  Pt requesting placement at [facility].   ? ?09/19/2021: no further seizure activity. Actively being treated with CIWA protocol. Pt has history of severe withdrawal and being monitored in SDU. TOC consulted for placement at [facility] per patient request.  ? ?09/20/2021: Pt became severely agitated in early morning hours and ended up being started on precedex infusion. Remains in ICU on precedex at this time.  Family reporting large amounts of chronic alcohol abuse at home.  ? ?09/21/21:  remains on precedex at 0.3 mcg/kg/min.  More lucid, less agitation.  No vomiting or diarrhea.  ? ? ? ?Assessment and Plan: ?* Alcohol withdrawal ([place]) ?With seizure-like activity at home ?EEG--excess beta activity, no seizure ?D/c Keppra ?Patient reports she does not know how much she drinks a day, when she starts drinking she cannot stop, she does drink vodka and gallon jugs of vodka found at her home per son's report.   ?precedex infusion started 3/21>>wean ?Start high dose thiamine ? ?Alcoholic ketoacidosis ?Patient had an anion gap of 14 ?Treating supportively with IV fluids>>improved ? ?Atypical chest pain ?This is a chronic issue ?Troponin 12>>14 ?Musculoskeletal and GI related ?2/27 Echo--EF 60-65%, no WMA, G1DD ?Personally reviewed EKG--sinus, nonspecific T wave change ? ?Esophagitis ?Continue pantoprazole ?Continue sulcrafate ?2/27 EGD--Grade C reflux Esophagitis (no bleeding), Schatzki ring, gastritis ? ?Seizure-like activity ([place]) ?No Seizure activity reported in last 72 hours.  ?EEG with no epileptiform activity.    ?D/C Keppra ?Alcohol level less than 10 on admission ?Seizure precautions ?Continue to monitor ? ?Hypomagnesemia ?replete ? ?Hypokalemia ?repleted ?Mag 1.7 ? ?Acute metabolic encephalopathy ?Due to alcohol withdrawal ?Overall improving ?Restart home folate and B12 ? ?Opioid dependence ([place]) ?PDMP reviewed ?-Patient receives tramadol 50 mg #30 on 1/11 and 06/13/21 ?-intermittent ativan Rx, last on 12/28 ? ?Hypothyroidism ?Continue Synthroid ? ?Hypertension ?Remains poorly controlled.  ?Continue Norvasc, add metoprolol 12.5 mg BID ?Add losartain ?Adding hydralazine IV PRN SBP>180 ? ? ? ? ? ? ? ? ?Status is: Inpatient ?Remains inpatient appropriate because: severity of illness requiring Precedex drip ? ? ? ?Family Communication:  no Family at bedside ? ?Consultants:  none ? ?Code Status:  FULL  ? ?DVT Prophylaxis:  [place] Heparin  ? ?Procedures: ?As Listed in Progress Note Above ? ?Antibiotics: ?None ? ? ? ? ? ?Subjective: ?Patient denies fevers, chills, headache, dyspnea, nausea, vomiting, diarrhea, abdominal pain, dysuria, hematuria, hematochezia,  and melena. ? ? ?Objective: ?Vitals:  ? 09/21/21 1300 09/21/21 1304 09/21/21 1307 09/21/21 1400  ?BP: (!) 166/81   (!) 173/77  ?Pulse: 80   77  ?Resp: 18   17  ?Temp:      ?TempSrc:      ?SpO2:  (!) 84% 92% 93%  ?Weight:      ?Height:      ? ? ?Intake/Output Summary  (Last 24 hours) at 09/21/2021 1525 ?Last data filed at 09/21/2021 0900 ?Gross per 24 hour  ?Intake 2114.35 ml  ?Output 1500 ml  ?Net 614.35 ml  ? ?Weight change: -0.3 kg ?Exam: ? ?General:  Pt is alert, follows commands appropriately, not in acute distress ?HEENT: No icterus, No thrush, No neck mass, [place]/AT ?Cardiovascular: RRR, S1/S2, no rubs, no gallops; reproducible CP substernal ?Respiratory: bibasilar crackles.  No wheeze ?Abdomen: Soft/+BS, non tender, non distended, no guarding ?Extremities: No edema, No lymphangitis, No petechiae, No rashes, no synovitis ? ? ?Data Reviewed: ?I have personally reviewed following labs and imaging studies ?Basic Metabolic Panel: ?Recent Labs  ?Lab 09/17/21 ?2041 09/18/21 ?9357 09/19/21 ?0177 09/20/21 ?0423 09/21/21 ?0406  ?NA 140 138 137 137 135  ?K 2.9* 3.2* 3.0* 3.7 3.1*  ?CL 106 101 103 105 101  ?CO2 14* '24 24 23 23  '$ ?GLUCOSE 173* 95 93 127* 104*  ?BUN 13 7* 6* 7* 8  ?CREATININE 0.88 0.68 0.84 0.79 0.72  ?CALCIUM 7.5* 7.7* 7.5* 8.1* 8.6*  ?MG  --  1.2* 2.0 1.7 1.7  ? ?Liver Function Tests: ?Recent Labs  ?Lab 09/17/21 ?2041 09/18/21 ?9390 09/19/21 ?3009  ?AST 51* 47* 33  ?ALT '25 25 19  '$ ?ALKPHOS 89 97 94  ?BILITOT 0.5 0.8 0.9  ?PROT 6.8 6.6 5.9*  ?ALBUMIN 3.6 3.6 3.2*  ? ?No results for input(s): LIPASE, AMYLASE in the last 168 hours. ?No results for input(s): AMMONIA in the last 168 hours. ?Coagulation Profile: ?No results for input(s): INR, PROTIME in the last 168 hours. ?CBC: ?Recent Labs  ?Lab 09/17/21 ?2041 09/18/21 ?2330 09/20/21 ?0505  ?WBC 6.4 6.2 5.4  ?NEUTROABS 4.2 3.6  --   ?HGB 11.0* 10.6* 9.7*  ?HCT 35.2* 33.9* 31.2*  ?MCV 85.4 82.1 85.0  ?PLT 191 156 120*  ? ?Cardiac Enzymes: ?No results for input(s): CKTOTAL, CKMB, CKMBINDEX, TROPONINI in the last 168 hours. ?BNP: ?Invalid input(s): POCBNP ?CBG: ?No results for input(s): GLUCAP in the last 168 hours. ?HbA1C: ?No results for input(s): HGBA1C in the last 72 hours. ?Urine analysis: ?   ?Component Value Date/Time  ?  COLORURINE STRAW (A) 08/31/2021 1618  ? APPEARANCEUR CLEAR 08/31/2021 1618  ? APPEARANCEUR Cloudy (A) 07/01/2021 1354  ? LABSPEC 1.005 08/31/2021 1618  ? PHURINE 6.0 08/31/2021 1618  ? GLUCOSEU NEGATIVE 08/31/2021 1618  ? [place] NEGATIVE 08/31/2021 1618  ? [place] NEGATIVE 08/31/2021 1618  ? BILIRUBINUR Negative 07/01/2021 1354  ? [place] NEGATIVE 08/31/2021 1618  ? PROTEINUR NEGATIVE 08/31/2021 1618  ? UROBILINOGEN negative 07/01/2015 1541  ? UROBILINOGEN 0.2 10/07/2012 1928  ? NITRITE NEGATIVE 08/31/2021 1618  ? LEUKOCYTESUR NEGATIVE 08/31/2021 1618  ? ?Sepsis Labs: ?'@LABRCNTIP'$ (procalcitonin:4,lacticidven:4) ?) ?Recent Results (from the past 240 hour(s))  ?MRSA Next Gen by PCR, Nasal     Status: None  ? Collection Time: 09/18/21  2:58 PM  ? Specimen: Nasal Mucosa; Nasal Swab  ?Result Value Ref Range Status  ? MRSA by PCR Next Gen NOT DETECTED NOT DETECTED Final  ?  Comment: (NOTE) ?The GeneXpert MRSA Assay (FDA approved for NASAL  specimens only), ?is one component of a comprehensive MRSA colonization surveillance ?program. It is not intended to diagnose MRSA infection nor to guide ?or monitor treatment for MRSA infections. ?Test performance is not FDA approved in patients less than 2 years ?old. ?Performed at [facility], [address] [place], [place] [address] ?  ?  ? ?Scheduled Meds: ? amLODipine  10 mg Oral Daily  ? Chlorhexidine Gluconate Cloth  6 each Topical Daily  ? DULoxetine  60 mg Oral Daily  ? folic acid  1 mg Oral Daily  ? Or  ? folic acid  1 mg Intravenous Daily  ? heparin  5,000 Units Subcutaneous Q8H  ? levothyroxine  50 mcg Oral QAC breakfast  ? LORazepam  0-4 mg Intravenous Q12H  ? losartan  50 mg Oral Daily  ? mouth rinse  15 mL Mouth Rinse BID  ? metoprolol tartrate  12.5 mg Oral BID  ? multivitamin with minerals  1 tablet Oral Daily  ? pantoprazole (PROTONIX) IV  40 mg Intravenous Q12H  ? sucralfate  1 g Oral TID WC & HS  ? vitamin B-12  500 mcg Oral Daily  ? ?Continuous Infusions: ?  sodium chloride 65 mL/hr at 09/21/21 0740  ? dexmedetomidine (PRECEDEX) IV infusion 0.3 mcg/kg/hr (09/21/21 0344)  ? magnesium sulfate bolus IVPB    ? thiamine injection    ? ? ?Procedures/Studies: ?CT Head Wo Con

## 2021-09-21 NOTE — Assessment & Plan Note (Addendum)
Replete ?Started mag ox bid ?

## 2021-09-22 ENCOUNTER — Inpatient Hospital Stay (HOSPITAL_COMMUNITY): Payer: Medicare Other

## 2021-09-22 DIAGNOSIS — F10931 Alcohol use, unspecified with withdrawal delirium: Secondary | ICD-10-CM

## 2021-09-22 DIAGNOSIS — R0789 Other chest pain: Secondary | ICD-10-CM | POA: Diagnosis not present

## 2021-09-22 DIAGNOSIS — G9341 Metabolic encephalopathy: Secondary | ICD-10-CM | POA: Diagnosis not present

## 2021-09-22 DIAGNOSIS — J9601 Acute respiratory failure with hypoxia: Secondary | ICD-10-CM

## 2021-09-22 LAB — BASIC METABOLIC PANEL
Anion gap: 8 (ref 5–15)
BUN: 12 mg/dL (ref 8–23)
CO2: 27 mmol/L (ref 22–32)
Calcium: 8.6 mg/dL — ABNORMAL LOW (ref 8.9–10.3)
Chloride: 98 mmol/L (ref 98–111)
Creatinine, Ser: 0.88 mg/dL (ref 0.44–1.00)
GFR, Estimated: >60 mL/min (ref >60)
Glucose, Bld: 104 mg/dL — ABNORMAL HIGH (ref 70–99)
Potassium: 3.6 mmol/L (ref 3.5–5.1)
Sodium: 133 mmol/L — ABNORMAL LOW (ref 135–145)

## 2021-09-22 LAB — CBC
HCT: 29.7 % — ABNORMAL LOW (ref 36.0–46.0)
Hemoglobin: 9.6 g/dL — ABNORMAL LOW (ref 12.0–15.0)
MCH: 26.4 pg (ref 26.0–34.0)
MCHC: 32.3 g/dL (ref 30.0–36.0)
MCV: 81.8 fL (ref 80.0–100.0)
Platelets: 194 10*3/uL (ref 150–400)
RBC: 3.63 MIL/uL — ABNORMAL LOW (ref 3.87–5.11)
RDW: 18.2 % — ABNORMAL HIGH (ref 11.5–15.5)
WBC: 6.8 10*3/uL (ref 4.0–10.5)
nRBC: 0 % (ref 0.0–0.2)

## 2021-09-22 LAB — BRAIN NATRIURETIC PEPTIDE: B Natriuretic Peptide: 129 pg/mL — ABNORMAL HIGH (ref 0.0–100.0)

## 2021-09-22 LAB — MAGNESIUM: Magnesium: 1.6 mg/dL — ABNORMAL LOW (ref 1.7–2.4)

## 2021-09-22 LAB — PROCALCITONIN: Procalcitonin: <0.1 ng/mL

## 2021-09-22 MED ORDER — IPRATROPIUM-ALBUTEROL 0.5-2.5 (3) MG/3ML IN SOLN
3.0000 mL | Freq: Four times a day (QID) | RESPIRATORY_TRACT | Status: DC
  Administered 2021-09-23 (×2): 3 mL via RESPIRATORY_TRACT
  Filled 2021-09-22 (×3): qty 3

## 2021-09-22 MED ORDER — FUROSEMIDE 10 MG/ML IJ SOLN
40.0000 mg | Freq: Once | INTRAMUSCULAR | Status: AC
  Administered 2021-09-22: 40 mg via INTRAVENOUS
  Filled 2021-09-22: qty 4

## 2021-09-22 MED ORDER — PIPERACILLIN-TAZOBACTAM 3.375 G IVPB
3.3750 g | Freq: Three times a day (TID) | INTRAVENOUS | Status: DC
Start: 2021-09-22 — End: 2021-09-23
  Administered 2021-09-22 – 2021-09-23 (×3): 3.375 g via INTRAVENOUS
  Filled 2021-09-22 (×3): qty 50

## 2021-09-22 MED ORDER — IPRATROPIUM-ALBUTEROL 0.5-2.5 (3) MG/3ML IN SOLN
3.0000 mL | Freq: Four times a day (QID) | RESPIRATORY_TRACT | Status: DC
Start: 2021-09-22 — End: 2021-09-22
  Administered 2021-09-22 (×2): 3 mL via RESPIRATORY_TRACT
  Filled 2021-09-22 (×2): qty 3

## 2021-09-22 MED ORDER — MAGNESIUM OXIDE -MG SUPPLEMENT 400 (240 MG) MG PO TABS
400.0000 mg | ORAL_TABLET | Freq: Two times a day (BID) | ORAL | Status: DC
  Administered 2021-09-22 – 2021-09-23 (×3): 400 mg via ORAL
  Filled 2021-09-22 (×3): qty 1

## 2021-09-22 NOTE — TOC Progression Note (Signed)
Transition of Care (TOC) - Progression Note  ? ? ?Patient Details  ?Name: Stacy Dalton ?MRN: [id number] ?Date of Birth: Feb 12, 1951 ? ?Transition of Care (TOC) CM/SW Contact  ?Salome Arnt, LCSW ?Phone Number: ?09/22/2021, 2:14 PM ? ?Clinical Narrative:  LCSW discussed bed offer at [facility] with pt and pt's son, [name]. Both are agreeable. Facility notified. TOC will continue to follow.   ? ? ? ?Expected Discharge Plan: [place] ?Barriers to Discharge: Continued Medical Work up ? ?Expected Discharge Plan and Services ?Expected Discharge Plan: [place] ?  ?  ?  ?Living arrangements for the past 2 months: [place] ?                ?  ?  ?  ?  ?  ?  ?  ?  ?  ?  ? ? ?Social Determinants of Health (SDOH) Interventions ?  ? ?Readmission Risk Interventions ? ?  09/19/2021  ?  2:18 PM 08/28/2021  ?  2:06 PM 06/08/2021  ? 12:54 PM  ?Readmission Risk Prevention Plan  ?Transportation Screening Complete  Complete  ?Medication Review Press photographer) Complete Complete Complete  ?PCP or Specialist appointment within 3-5 days of discharge Not Complete    ?[place] or Home Care Consult Complete Complete Complete  ?SW Recovery Care/Counseling Consult  Complete Complete  ?Palliative Care Screening  Not Applicable Not Applicable  ?[place]   Not Applicable  ? ? ?

## 2021-09-22 NOTE — Progress Notes (Signed)
?  ?       ?PROGRESS NOTE ? ?Stacy Dalton WGN:[id number] DOB: 1950/12/05 DOA: 09/17/2021 ?PCP: [name], FNP ? ?Brief History:  ?a 71 y.o. female with medical history significant of alcohol abuse, COPD, depression, GERD, hypertension, hypothyroidism, and more presents ED with a chief complaint of alcohol withdrawal.  Patient reports she does not know why she is here but if she had to guess is because she drinks too much.  Is reported that her son took her alcohol away from her couple days ago and today she had seizure-like activity.  Patient is oriented to self place and time.  She reports she is interested in cutting back on drinking, but is not interested in total cessation.  She would like to go back to [place], as she does not feel like she can take care of herself at home anymore.  Patient has no other complaints at this time.  Pt has had severe alcohol withdrawal in the past requiring ICU care.   ? ?09/18/2021:  Pt asking for more pain medication. Admits to heavy daily alcohol consumption.  Pt requesting placement at [facility].   ? ?09/19/2021: no further seizure activity. Actively being treated with CIWA protocol. Pt has history of severe withdrawal and being monitored in SDU. TOC consulted for placement at [facility] per patient request.  ? ?09/20/2021: Pt became severely agitated in early morning hours and ended up being started on precedex infusion. Remains in ICU on precedex at this time.  Family reporting large amounts of chronic alcohol abuse at home.  ? ?09/21/21:  remains on precedex at 0.3 mcg/kg/min.  More lucid, less agitation.  No vomiting or diarrhea. ? ?09/22/21:  more sob.  Desaturation to 88% on RA.  CXR with LLL opacity and vascular congestion.  Lasix IV x 1 and zosyn started  ? ? ? ?Assessment and Plan: ?* Alcohol withdrawal ([place]) ?With seizure-like activity at home ?EEG--excess beta activity, no seizure ?D/c Keppra ?Patient reports she does not know how much she drinks a day,  when she starts drinking she cannot stop, she does drink vodka and gallon jugs of vodka found at her home per son's report.  ?precedex infusion started 3/21>>wean ?Start high dose thiamine ? ?Acute respiratory failure with hypoxia (HCC) ?09/22/21--developed tachypnea with oxygen saturation 88-89% on RA ?Due to pulm edema and aspiration pneumonitis ?-personally reviewed CXR--increase pulm vasc congestion, LLL opacity ?-lasix IV x 1 ?-start zosyn ?-BNP 129 ?-PCT <0.10 ? ?Seizure-like activity ([place]) ?No Seizure activity since admission ?EEG with no epileptiform activity.    ?D/C Keppra ?Alcohol level less than 10 on admission ?Seizure precautions ?Continue to monitor ? ?Acute metabolic encephalopathy ?Due to alcohol withdrawal ?Overall improving>>back to baseline ?Restart home folate and B12 ? ?Alcoholic ketoacidosis ?Patient had an anion gap of 14 ?Treating supportively with IV fluids>>improved ? ?Atypical chest pain ?This is a chronic issue ?Troponin 12>>14 ?Musculoskeletal and GI related ?2/27 Echo--EF 60-65%, no WMA, G1DD ?Personally reviewed EKG--sinus, nonspecific T wave change ? ?Esophagitis ?Continue pantoprazole ?Continue sulcrafate ?2/27 EGD--Grade C reflux Esophagitis (no bleeding), Schatzki ring, gastritis ? ?Hypomagnesemia ?Replete ?Start mag ox bid ? ?Hypokalemia ?repleted ?Mag 1.7 ? ?Opioid dependence ([place]) ?PDMP reviewed ?-Patient receives tramadol 50 mg #30 on 1/11 and 06/13/21 ?-intermittent ativan Rx, last on 12/28 ? ?Hypothyroidism ?Continue Synthroid ? ?Hypertension ?Remains poorly controlled.  ?Continue Norvasc, add metoprolol 12.5 mg BID ?Added losartain ?Adding hydralazine IV PRN SBP>180 ? ? ?Status is: Inpatient ?Remains inpatient appropriate because: severity  of illness requiring Precedex drip and new oxygen requirement ?  ?  ?  ?Family Communication:  no Family at bedside ?  ?Consultants:  none ?  ?Code Status:  FULL  ?  ?DVT Prophylaxis:  [place] Heparin  ?  ?Procedures: ?As Listed in Progress  Note Above ?  ?Antibiotics: ?None ?  ? ? ? ? ?Subjective: ?Patient complains of sob.  Complains of pain all over.  No n/v/d, abd pain, headache ? ?Objective: ?Vitals:  ? 09/22/21 0844 09/22/21 1150 09/22/21 1403 09/22/21 1428  ?BP:      ?Pulse: 88   84  ?Resp: 18   18  ?Temp:  97.9 ?F (36.6 ?C)    ?TempSrc:  Oral    ?SpO2: 93%  (!) 89% 97%  ?Weight:      ?Height:      ? ? ?Intake/Output Summary (Last 24 hours) at 09/22/2021 1643 ?Last data filed at 09/22/2021 0631 ?Gross per 24 hour  ?Intake 1643.49 ml  ?Output 1700 ml  ?Net -56.51 ml  ? ?Weight change: 0.3 kg ?Exam: ? ?General:  Pt is alert, follows commands appropriately, not in acute distress ?HEENT: No icterus, No thrush, No neck mass, [place]/AT ?Cardiovascular: RRR, S1/S2, no rubs, no gallops ?Respiratory: bibasilar rales L>R.  No wheeze ?Abdomen: Soft/+BS, non tender, non distended, no guarding ?Extremities: 1 + LE edema, No lymphangitis, No petechiae, No rashes, no synovitis ? ? ?Data Reviewed: ?I have personally reviewed following labs and imaging studies ?Basic Metabolic Panel: ?Recent Labs  ?Lab 09/18/21 ?7829 09/19/21 ?0336 09/20/21 ?0423 09/21/21 ?0406 09/22/21 ?5621  ?NA 138 137 137 135 133*  ?K 3.2* 3.0* 3.7 3.1* 3.6  ?CL 101 103 105 101 98  ?CO2 '24 24 23 23 27  '$ ?GLUCOSE 95 93 127* 104* 104*  ?BUN 7* 6* 7* 8 12  ?CREATININE 0.68 0.84 0.79 0.72 0.88  ?CALCIUM 7.7* 7.5* 8.1* 8.6* 8.6*  ?MG 1.2* 2.0 1.7 1.7 1.6*  ? ?Liver Function Tests: ?Recent Labs  ?Lab 09/17/21 ?2041 09/18/21 ?3086 09/19/21 ?5784  ?AST 51* 47* 33  ?ALT '25 25 19  '$ ?ALKPHOS 89 97 94  ?BILITOT 0.5 0.8 0.9  ?PROT 6.8 6.6 5.9*  ?ALBUMIN 3.6 3.6 3.2*  ? ?No results for input(s): LIPASE, AMYLASE in the last 168 hours. ?No results for input(s): AMMONIA in the last 168 hours. ?Coagulation Profile: ?No results for input(s): INR, PROTIME in the last 168 hours. ?CBC: ?Recent Labs  ?Lab 09/17/21 ?2041 09/18/21 ?6962 09/20/21 ?0505 09/22/21 ?0359  ?WBC 6.4 6.2 5.4 6.8  ?NEUTROABS 4.2 3.6  --   --   ?HGB  11.0* 10.6* 9.7* 9.6*  ?HCT 35.2* 33.9* 31.2* 29.7*  ?MCV 85.4 82.1 85.0 81.8  ?PLT 191 156 120* 194  ? ?Cardiac Enzymes: ?No results for input(s): CKTOTAL, CKMB, CKMBINDEX, TROPONINI in the last 168 hours. ?BNP: ?Invalid input(s): POCBNP ?CBG: ?No results for input(s): GLUCAP in the last 168 hours. ?HbA1C: ?No results for input(s): HGBA1C in the last 72 hours. ?Urine analysis: ?   ?Component Value Date/Time  ? COLORURINE STRAW (A) 08/31/2021 1618  ? APPEARANCEUR CLEAR 08/31/2021 1618  ? APPEARANCEUR Cloudy (A) 07/01/2021 1354  ? LABSPEC 1.005 08/31/2021 1618  ? PHURINE 6.0 08/31/2021 1618  ? GLUCOSEU NEGATIVE 08/31/2021 1618  ? [place] NEGATIVE 08/31/2021 1618  ? [place] NEGATIVE 08/31/2021 1618  ? BILIRUBINUR Negative 07/01/2021 1354  ? [place] NEGATIVE 08/31/2021 1618  ? PROTEINUR NEGATIVE 08/31/2021 1618  ? UROBILINOGEN negative 07/01/2015 1541  ? UROBILINOGEN 0.2 10/07/2012 1928  ?  NITRITE NEGATIVE 08/31/2021 1618  ? LEUKOCYTESUR NEGATIVE 08/31/2021 1618  ? ?Sepsis Labs: ?'@LABRCNTIP'$ (procalcitonin:4,lacticidven:4) ?) ?Recent Results (from the past 240 hour(s))  ?MRSA Next Gen by PCR, Nasal     Status: None  ? Collection Time: 09/18/21  2:58 PM  ? Specimen: Nasal Mucosa; Nasal Swab  ?Result Value Ref Range Status  ? MRSA by PCR Next Gen NOT DETECTED NOT DETECTED Final  ?  Comment: (NOTE) ?The GeneXpert MRSA Assay (FDA approved for NASAL specimens only), ?is one component of a comprehensive MRSA colonization surveillance ?program. It is not intended to diagnose MRSA infection nor to guide ?or monitor treatment for MRSA infections. ?Test performance is not FDA approved in patients less than 2 years ?old. ?Performed at [facility], [address], [place], [place] [address] ?  ?  ? ?Scheduled Meds: ? amLODipine  10 mg Oral Daily  ? Chlorhexidine Gluconate Cloth  6 each Topical Daily  ? DULoxetine  60 mg Oral Daily  ? folic acid  1 mg Oral Daily  ? Or  ? folic acid  1 mg Intravenous Daily  ? heparin  5,000 Units  Subcutaneous Q8H  ? ipratropium-albuterol  3 mL Nebulization Q6H  ? levothyroxine  50 mcg Oral QAC breakfast  ? losartan  50 mg Oral Daily  ? magnesium oxide  400 mg Oral BID  ? mouth rinse  15 mL Mouth

## 2021-09-22 NOTE — Progress Notes (Signed)
Physical Therapy Treatment ?Patient Details ?Name: Stacy Dalton ?MRN: [id number] ?DOB: 21-Oct-1950 ?Today's Date: 09/22/2021 ? ? ?History of Present Illness Stacy Dalton is a 71 y.o. female with medical history significant of alcohol abuse, COPD, depression, GERD, hypertension, hypothyroidism, and more presents ED with a chief complaint of alcohol withdrawal.  Patient reports she does not know why she is here but if she had to guess is because she drinks too much.  Is reported that her son took her alcohol away from her couple days ago and today she had seizure-like activity.  Patient is oriented to self place and time.  She reports she is interested in cutting back on drinking, but is not interested in total cessation.  She would like to go back to [place], as she does not feel like she can take care of herself at home anymore.  Patient has no other complaints at this time. ? ?  ?PT Comments  ? ? Patient demonstrates slightly labored movement for sitting up at bedside, increased endurance/distance for gait training with slightly labored unsteady slow cadence without loss of balance and limited mostly due to fatigue.  Patient demonstrates fair/good return for completing BLE ROM/strengthening exercises with verbal cueing and demonstration while seated at bedside.  Patient tolerated sitting up in chair after therapy - RN notified.  Patient will benefit from continued skilled physical therapy in hospital and recommended venue below to increase strength, balance, endurance for safe ADLs and gait.  ? ? ?   ?Recommendations for follow up therapy are one component of a multi-disciplinary discharge planning process, led by the attending physician.  Recommendations may be updated based on patient status, additional functional criteria and insurance authorization. ? ?Follow Up Recommendations ? Skilled nursing-short term rehab (<3 hours/day) ?  ?  ?Assistance Recommended at Discharge Intermittent Supervision/Assistance   ?Patient can return home with the following Assistance with cooking/housework;Help with stairs or ramp for entrance;A little help with bathing/dressing/bathroom;A lot of help with walking and/or transfers ?  ?Equipment Recommendations ? None recommended by PT  ?  ?Recommendations for Other Services   ? ? ?  ?Precautions / Restrictions Precautions ?Precautions: Fall ?Restrictions ?Weight Bearing Restrictions: No  ?  ? ?Mobility ? Bed Mobility ?Overal bed mobility: Needs Assistance ?Bed Mobility: Supine to Sit ?  ?  ?Supine to sit: Min guard ?  ?  ?General bed mobility comments: slightly labored movement ?  ? ?Transfers ?Overall transfer level: Needs assistance ?Equipment used: Rolling walker (2 wheels) ?Transfers: Sit to/from Stand, Bed to chair/wheelchair/BSC ?Sit to Stand: Min assist ?  ?Step pivot transfers: Min assist ?  ?  ?  ?General transfer comment: lightly unsteady labored movement ?  ? ?Ambulation/Gait ?Ambulation/Gait assistance: Min assist ?Gait Distance (Feet): 40 Feet ?Assistive device: Rolling walker (2 wheels) ?Gait Pattern/deviations: Decreased step length - right, Decreased step length - left, Decreased stride length ?Gait velocity: decreased ?  ?  ?General Gait Details: increased endurance/distatnce for ambulation with slow slightly unsteady cadence without loss of balance, limited mostly due to fatigue ? ? ?Stairs ?  ?  ?  ?  ?  ? ? ?Wheelchair Mobility ?  ? ?Modified Rankin (Stroke Patients Only) ?  ? ? ?  ?Balance Overall balance assessment: Needs assistance ?Sitting-balance support: Feet supported, No upper extremity supported ?Sitting balance-Leahy Scale: Fair ?Sitting balance - Comments: fair/good seated at EOB ?  ?Standing balance support: During functional activity, Bilateral upper extremity supported ?Standing balance-Leahy Scale: Fair ?Standing balance comment: fair/good using  RW ?  ?  ?  ?  ?  ?  ?  ?  ?  ?  ?  ?  ? ?  ?Cognition Arousal/Alertness: Awake/alert ?Behavior During Therapy:  [facility] for tasks assessed/performed ?Overall Cognitive Status: Within Functional Limits for tasks assessed ?  ?  ?  ?  ?  ?  ?  ?  ?  ?  ?  ?  ?  ?  ?  ?  ?  ?  ?  ? ?  ?Exercises General Exercises - Lower Extremity ?Long Arc Quad: Seated, AROM, Strengthening, Both, 10 reps ?Hip Flexion/Marching: Seated, AROM, Strengthening, Both, 10 reps ?Toe Raises: Seated, AROM, Strengthening, Both, 10 reps ?Heel Raises: Seated, AROM, Strengthening, Both, 10 reps ? ?  ?General Comments   ?  ?  ? ?Pertinent Vitals/Pain Pain Assessment ?Pain Assessment: No/denies pain  ? ? ?Home Living   ?  ?  ?  ?  ?  ?  ?  ?  ?  ?   ?  ?Prior Function    ?  ?  ?   ? ?PT Goals (current goals can now be found in the care plan section) Acute Rehab PT Goals ?Patient Stated Goal: return home with family/friends to assist ?PT Goal Formulation: With patient ?Time For Goal Achievement: 10/04/21 ?Potential to Achieve Goals: Good ?Progress towards PT goals: Progressing toward goals ? ?  ?Frequency ? ? ? Min 3X/week ? ? ? ?  ?PT Plan Current plan remains appropriate  ? ? ?Co-evaluation   ?  ?  ?  ?  ? ?  ?AM-PAC PT "6 Clicks" Mobility   ?Outcome Measure ? Help needed turning from your back to your side while in a flat bed without using bedrails?: None ?Help needed moving from lying on your back to sitting on the side of a flat bed without using bedrails?: A Little ?Help needed moving to and from a bed to a chair (including a wheelchair)?: A Little ?Help needed standing up from a chair using your arms (e.g., wheelchair or bedside chair)?: A Little ?Help needed to walk in hospital room?: A Lot ?Help needed climbing 3-5 steps with a railing? : A Lot ?6 Click Score: 17 ? ?  ?End of Session   ?Activity Tolerance: Patient tolerated treatment well;Patient limited by fatigue ?Patient left: in chair;with call bell/phone within reach ?Nurse Communication: Mobility status ?PT Visit Diagnosis: Unsteadiness on feet (R26.81);Other abnormalities of gait and mobility  (R26.89);Muscle weakness (generalized) (M62.81) ?  ? ? ?Time: 3810-1751 ?PT Time Calculation (min) (ACUTE ONLY): 25 min ? ?Charges:  $Gait Training: 8-22 mins ?$Therapeutic Exercise: 8-22 mins          ?          ? ?4:18 PM, 09/22/21 ?Lonell Grandchild, MPT ?Physical Therapist with [place] ?[facility] ?[phone] office ?[phone] mobile phone ? ? ?

## 2021-09-22 NOTE — Assessment & Plan Note (Addendum)
09/22/21--developed tachypnea with oxygen saturation 88-89% on RA ?Due to pulm edema and aspiration pneumonitis ?-personally reviewed CXR--increase pulm vasc congestion, LLL opacity ?-lasix 40 mg  IV x 1 ?-start zosyn>>d/c with amox/clav x 6 more days ?-BNP 129 ?-PCT <0.10 ?Stable on RA at time of d/c ?

## 2021-09-23 DIAGNOSIS — F10931 Alcohol use, unspecified with withdrawal delirium: Secondary | ICD-10-CM | POA: Diagnosis not present

## 2021-09-23 DIAGNOSIS — G9341 Metabolic encephalopathy: Secondary | ICD-10-CM | POA: Diagnosis not present

## 2021-09-23 DIAGNOSIS — J9601 Acute respiratory failure with hypoxia: Secondary | ICD-10-CM | POA: Diagnosis not present

## 2021-09-23 DIAGNOSIS — F10939 Alcohol use, unspecified with withdrawal, unspecified: Secondary | ICD-10-CM | POA: Diagnosis not present

## 2021-09-23 LAB — BASIC METABOLIC PANEL
Anion gap: 11 (ref 5–15)
BUN: 18 mg/dL (ref 8–23)
CO2: 28 mmol/L (ref 22–32)
Calcium: 9 mg/dL (ref 8.9–10.3)
Chloride: 95 mmol/L — ABNORMAL LOW (ref 98–111)
Creatinine, Ser: 1.06 mg/dL — ABNORMAL HIGH (ref 0.44–1.00)
GFR, Estimated: 55 mL/min — ABNORMAL LOW (ref >60)
Glucose, Bld: 110 mg/dL — ABNORMAL HIGH (ref 70–99)
Potassium: 3.4 mmol/L — ABNORMAL LOW (ref 3.5–5.1)
Sodium: 134 mmol/L — ABNORMAL LOW (ref 135–145)

## 2021-09-23 LAB — CBC
HCT: 31.8 % — ABNORMAL LOW (ref 36.0–46.0)
Hemoglobin: 10.4 g/dL — ABNORMAL LOW (ref 12.0–15.0)
MCH: 27 pg (ref 26.0–34.0)
MCHC: 32.7 g/dL (ref 30.0–36.0)
MCV: 82.6 fL (ref 80.0–100.0)
Platelets: 222 10*3/uL (ref 150–400)
RBC: 3.85 MIL/uL — ABNORMAL LOW (ref 3.87–5.11)
RDW: 19 % — ABNORMAL HIGH (ref 11.5–15.5)
WBC: 6.5 10*3/uL (ref 4.0–10.5)
nRBC: 0 % (ref 0.0–0.2)

## 2021-09-23 LAB — MAGNESIUM: Magnesium: 1.4 mg/dL — ABNORMAL LOW (ref 1.7–2.4)

## 2021-09-23 LAB — GLUCOSE, CAPILLARY: Glucose-Capillary: 104 mg/dL — ABNORMAL HIGH (ref 70–99)

## 2021-09-23 MED ORDER — HYDROCODONE-ACETAMINOPHEN 5-325 MG PO TABS
1.0000 | ORAL_TABLET | Freq: Four times a day (QID) | ORAL | 0 refills | Status: DC | PRN
Start: 2021-09-23 — End: 2021-10-11

## 2021-09-23 MED ORDER — HEPARIN SODIUM (PORCINE) 1000 UNIT/ML IJ SOLN
INTRAMUSCULAR | Status: AC
  Administered 2021-09-23: 1000 [IU]
  Filled 2021-09-23: qty 1

## 2021-09-23 MED ORDER — LORAZEPAM 2 MG/ML IJ SOLN
1.0000 mg | INTRAMUSCULAR | Status: DC | PRN
  Administered 2021-09-23: 1 mg via INTRAVENOUS
  Filled 2021-09-23: qty 1

## 2021-09-23 MED ORDER — AMOXICILLIN-POT CLAVULANATE 875-125 MG PO TABS: 1.0000 | ORAL_TABLET | Freq: Two times a day (BID) | ORAL | Status: DC

## 2021-09-23 MED ORDER — POTASSIUM CHLORIDE CRYS ER 20 MEQ PO TBCR
40.0000 meq | EXTENDED_RELEASE_TABLET | Freq: Once | ORAL | Status: AC
  Administered 2021-09-23: 40 meq via ORAL
  Filled 2021-09-23: qty 2

## 2021-09-23 MED ORDER — HYDROCODONE-ACETAMINOPHEN 5-325 MG PO TABS: 1.0000 | ORAL_TABLET | Freq: Four times a day (QID) | ORAL | Status: DC | PRN

## 2021-09-23 MED ORDER — MAGNESIUM SULFATE 2 GM/50ML IV SOLN
2.0000 g | Freq: Once | INTRAVENOUS | Status: AC
  Administered 2021-09-23: 2 g via INTRAVENOUS
  Filled 2021-09-23: qty 50

## 2021-09-23 MED ORDER — LORAZEPAM 1 MG PO TABS
1.0000 mg | ORAL_TABLET | ORAL | Status: DC | PRN
  Administered 2021-09-23: 2 mg via ORAL
  Filled 2021-09-23: qty 2

## 2021-09-23 MED ORDER — METOPROLOL TARTRATE 25 MG PO TABS: 12.5000 mg | ORAL_TABLET | Freq: Two times a day (BID) | ORAL | Status: DC

## 2021-09-23 MED ORDER — LOSARTAN POTASSIUM 50 MG PO TABS: 50.0000 mg | ORAL_TABLET | Freq: Every day | ORAL | Status: DC

## 2021-09-23 MED ORDER — THIAMINE HCL 100 MG PO TABS: 100.0000 mg | ORAL_TABLET | Freq: Every day | ORAL | Status: DC

## 2021-09-23 MED ORDER — MAGNESIUM OXIDE -MG SUPPLEMENT 400 (240 MG) MG PO TABS: 400.0000 mg | ORAL_TABLET | Freq: Two times a day (BID) | ORAL | Status: DC

## 2021-09-23 MED ORDER — ADULT MULTIVITAMIN W/MINERALS CH
1.0000 | ORAL_TABLET | Freq: Every day | ORAL | Status: DC
  Administered 2021-09-23: 1 via ORAL
  Filled 2021-09-23: qty 1

## 2021-09-23 NOTE — Care Management Important Message (Signed)
Important Message ? ?Patient Details  ?Name: Stacy Dalton ?MRN: [id number] ?Date of Birth: 1951/01/02 ? ? ?Medicare Important Message Given:  Yes ? ? ? ? ?Tommy Medal ?09/23/2021, 11:44 AM ?

## 2021-09-23 NOTE — Chronic Care Management (AMB) (Signed)
?  Care Management  ? ?Note ? ?09/23/2021 ?Name: Stacy Dalton MRN: [id number] DOB: Sep 25, 1950 ? ?Stacy Dalton is a 70 y.o. year old female who is a primary care patient of [name], FNP and is actively engaged with the care management team. I reached out to [name] by phone today to assist with re-scheduling a follow up visit with the Licensed Clinical Social Worker ? ?Follow up plan: ?Unable to make contact on outreach attempts x 3. PCP Evelina Dun A, FNPnotified via routed documentation in medical record.  ? ?Noreene Larsson, RMA ?Care Guide, Embedded Care Coordination ?[facility]  Care Management  ?[place], [place] [address] ?Direct Dial: [phone] ?Museum/[email or web address] ?Website: [email or web address]  ? ?

## 2021-09-23 NOTE — Discharge Summary (Signed)
?Physician Discharge Summary ?  ?Patient: Stacy Dalton MRN: [id number] DOB: [birth year]  ?Admit date:     09/17/2021  ?Discharge date: 09/23/21  ?Discharge Physician: Shanon Brow Gracelynn Bircher  ? ?PCP: [name], FNP  ? ?Recommendations at discharge:  ? Please follow up with primary care provider within 1-2 weeks ? Please repeat BMP and CBC in one week ? ? ? ?Hospital Course: ?a 71 y.o. female with medical history significant of alcohol abuse, COPD, depression, GERD, hypertension, hypothyroidism, and more presents ED with a chief complaint of alcohol withdrawal.  Patient reports she does not know why she is here but if she had to guess is because she drinks too much.  Is reported that her son took her alcohol away from her couple days ago and today she had seizure-like activity.  Patient is oriented to self place and time.  She reports she is interested in cutting back on drinking, but is not interested in total cessation.  She would like to go back to [place], as she does not feel like she can take care of herself at home anymore.  Patient has no other complaints at this time.  Pt has had severe alcohol withdrawal in the past requiring ICU care.   ? ?09/18/2021:  Pt asking for more pain medication. Admits to heavy daily alcohol consumption.  Pt requesting placement at [facility].   ? ?09/19/2021: no further seizure activity. Actively being treated with CIWA protocol. Pt has history of severe withdrawal and being monitored in SDU. TOC consulted for placement at [facility] per patient request.  ? ?09/20/2021: Pt became severely agitated in early morning hours and ended up being started on precedex infusion. Remains in ICU on precedex at this time.  Family reporting large amounts of chronic alcohol abuse at home.  ? ?09/21/21:  remains on precedex at 0.3 mcg/kg/min.  More lucid, less agitation.  No vomiting or diarrhea. ? ?09/22/21:  more sob.  Desaturation to 88% on RA.  CXR with LLL opacity and vascular congestion.   Lasix IV x 1 and zosyn started ? ?09/23/21: patient remains clinically stable for d/c.  Oxygen saturations improved 95-96% on RA and remains afebrile and hemodynamically stable.  Tolerating diet, eating 75% of meals.  No signs of alcohol withdrawal or seizures. ? ?Assessment and Plan: ?* Alcohol withdrawal ([place]) ?With seizure-like activity at home ?EEG--excess beta activity, no seizure ?D/c Keppra ?Patient reports she does not know how much she drinks a day, when she starts drinking she cannot stop, she does drink vodka and gallon jugs of vodka found at her home per son's report.  ?precedex infusion started 3/21>>wean ?Start high dose thiamine ?D/c with daily thiamine ? ?Acute respiratory failure with hypoxia (HCC) ?09/22/21--developed tachypnea with oxygen saturation 88-89% on RA ?Due to pulm edema and aspiration pneumonitis ?-personally reviewed CXR--increase pulm vasc congestion, LLL opacity ?-lasix 40 mg  IV x 1 ?-start zosyn>>d/c with amox/clav x 6 more days ?-BNP 129 ?-PCT <0.10 ?Stable on RA at time of d/c ? ?Seizure-like activity ([place]) ?No Seizure activity since admission ?EEG with no epileptiform activity.    ?D/C Keppra ?Alcohol level less than 10 on admission ?Seizure precautions ?Continue to monitor ? ?Acute metabolic encephalopathy ?Due to alcohol withdrawal ?Overall improving>>back to baseline ?Restarted home folate and B12 ? ?Alcoholic ketoacidosis ?Patient had an anion gap of 14 ?Treating supportively with IV fluids>>improved ? ?Atypical chest pain ?This is a chronic issue ?Troponin 12>>14 ?Musculoskeletal and GI related ?2/27 Echo--EF 60-65%, no WMA,  G1DD ?Personally reviewed EKG--sinus, nonspecific T wave change ? ?Esophagitis ?Continue pantoprazole ?Continue sulcrafate ?2/27 EGD--Grade C reflux Esophagitis (no bleeding), Schatzki ring, gastritis ? ?Hypomagnesemia ?Replete ?Started mag ox bid ? ?Hypokalemia ?repleted ? ?Opioid dependence ([place]) ?PDMP reviewed ?-Patient receives tramadol 50 mg #30 on  1/11 and 06/13/21 ?-intermittent ativan Rx, last on 12/28 ? ?Hypothyroidism ?Continue Synthroid ? ?Hypertension ?Remains poorly controlled.  ?Continue Norvasc, add metoprolol 12.5 mg BID ?Added losartain ?Adding hydralazine IV PRN SBP>180 ? ? ? ? ?  ? ? ?Consultants: none ?Procedures performed: none  ?Disposition: Skilled nursing facility ?Diet recommendation:  ?Regular diet ?DISCHARGE MEDICATION: ?Allergies as of 09/23/2021   ? ?   Reactions  ? Librium [chlordiazepoxide]   ? Became unresponsive  ? Toradol [ketorolac Tromethamine] Shortness Of Breath  ? Butalbital-apap-caffeine Other (See Comments)  ? jittery  ? Demerol Swelling  ? Esgic [butalbital-apap-caffeine] Other (See Comments)  ? jittery  ? Iodine Other (See Comments)  ? bp bottomed out per pt several hours later; unsure if pre medicated in past with cm; done in [place]  ? Pyridium [phenazopyridine] Nausea Only  ? ?  ? ?  ?Medication List  ?  ? ?STOP taking these medications   ? ?fluticasone-salmeterol 250-50 MCG/ACT Aepb ?Commonly known as: Advair Diskus ?  ?ondansetron 4 MG tablet ?Commonly known as: Zofran ?  ?traMADol 50 MG tablet ?Commonly known as: ULTRAM ?  ?Vitamin D (Ergocalciferol) 1.25 MG (50000 UNIT) Caps capsule ?Commonly known as: DRISDOL ?  ? ?  ? ?TAKE these medications   ? ?acetaminophen 500 MG tablet ?Commonly known as: TYLENOL ?Take by mouth. ?  ?albuterol 108 (90 Base) MCG/ACT inhaler ?Commonly known as: VENTOLIN HFA ?Inhale 2 puffs into the lungs every 4 (four) hours as needed for wheezing or shortness of breath. ?  ?amLODipine 10 MG tablet ?Commonly known as: NORVASC ?Take 1 tablet (10 mg total) by mouth daily. ?  ?amoxicillin-clavulanate 875-125 MG tablet ?Commonly known as: AUGMENTIN ?Take 1 tablet by mouth every 12 (twelve) hours. X 6 days ?  ?DULoxetine 60 MG capsule ?Commonly known as: Cymbalta ?Take 1 capsule (60 mg total) by mouth daily. ?  ?folic acid 1 MG tablet ?Commonly known as: FOLVITE ?Take 1 tablet (1 mg total) by mouth  daily. ?  ?HYDROcodone-acetaminophen 5-325 MG tablet ?Commonly known as: NORCO/VICODIN ?Take 1 tablet by mouth every 6 (six) hours as needed for moderate pain. ?  ?hydrOXYzine 25 MG capsule ?Commonly known as: VISTARIL ?Take 25 mg by mouth every 4 (four) hours as needed for anxiety. ?  ?levothyroxine 50 MCG tablet ?Commonly known as: Synthroid ?Take 1 tablet (50 mcg total) by mouth daily before breakfast. ?  ?losartan 50 MG tablet ?Commonly known as: COZAAR ?Take 1 tablet (50 mg total) by mouth daily. ?Start taking on: September 24, 2021 ?  ?magnesium oxide 400 (240 Mg) MG tablet ?Commonly known as: MAG-OX ?Take 1 tablet (400 mg total) by mouth 2 (two) times daily. ?  ?metoprolol tartrate 25 MG tablet ?Commonly known as: LOPRESSOR ?Take 0.5 tablets (12.5 mg total) by mouth 2 (two) times daily. ?  ?multivitamin with minerals Tabs tablet ?Take 1 tablet by mouth daily. ?  ?pantoprazole 40 MG tablet ?Commonly known as: Protonix ?Take 1 tablet (40 mg total) by mouth 2 (two) times daily. ?  ?sucralfate 1 GM/10ML suspension ?Commonly known as: CARAFATE ?Take 10 mLs (1 g total) by mouth 4 (four) times daily -  with meals and at bedtime. ?  ?thiamine 100 MG  tablet ?Take 1 tablet (100 mg total) by mouth daily. ?  ?vitamin B-12 1000 MCG tablet ?Commonly known as: CYANOCOBALAMIN ?Take 1 tablet (1,000 mcg total) by mouth daily. ?  ? ?  ? ? Contact information for after-discharge care   ? ? Destination   ? ? [facility] .   ?Service: Skilled Nursing ?Contact information: ?[place] ?[place] ?[phone] ? ?  ?  ? ?  ?  ? ?  ?  ? ?  ? ?Discharge Exam: ?Filed Weights  ? 09/20/21 0500 09/21/21 0500 09/22/21 0410  ?Weight: 56.9 kg 56.6 kg 56.9 kg  ? ?HEENT:  [place]/AT, No thrush, no icterus ?CV:  RRR, no rub, no S3, no S4 ?Lung:  fine bibasilar rales. No wheeze ?Abd:  soft/+BS, NT ?Ext:  No edema, no lymphangitis, no synovitis, no rash ? ? ?Condition at discharge: stable ? ?The  results of significant diagnostics from this hospitalization (including imaging, microbiology, ancillary and laboratory) are listed below for reference.  ? ?Imaging Studies: ?CT Head Wo Contrast ? ?Result Dat

## 2021-09-23 NOTE — TOC Transition Note (Signed)
Transition of Care (TOC) - CM/SW Discharge Note ? ? ?Patient Details  ?Name: Stacy Dalton ?MRN: [id number] ?Date of Birth: Nov 29, 1950 ? ?Transition of Care (TOC) CM/SW Contact:  ?Boneta Lucks, RN ?Phone Number: ?09/23/2021, 1:31 PM ? ? ?Clinical Narrative:   Patient discharging to [facility], RN called report, DC summary sent, EMS scheduled. TOC left tim her son a message.  ? ?Final next level of care: [place] ?Barriers to Discharge: Barriers Resolved ? ? ?Patient Goals and CMS Choice ?Patient states their goals for this hospitalization and ongoing recovery are:: agreeable to SNF ?CMS Medicare.gov Compare Post Acute Care list provided to:: Patient ?Choice offered to / list presented to : Patient ? ?Discharge Placement ?  ?           ?Patient chooses bed at:  [facility]) ?Patient to be transferred to facility by: EMS ?Name of family member notified: [name] , left son a message ?Patient and family notified of of transfer: 09/23/21 ? ?Discharge Plan and Services ? [facility] ?  ?          Readmission Risk Interventions ? ?  09/19/2021  ?  2:18 PM 08/28/2021  ?  2:06 PM 06/08/2021  ? 12:54 PM  ?Readmission Risk Prevention Plan  ?Transportation Screening Complete  Complete  ?Medication Review Press photographer) Complete Complete Complete  ?PCP or Specialist appointment within 3-5 days of discharge Not Complete    ?[place] or Home Care Consult Complete Complete Complete  ?SW Recovery Care/Counseling Consult  Complete Complete  ?Palliative Care Screening  Not Applicable Not Applicable  ?[place]   Not Applicable  ? ? ? ? ? ?

## 2021-09-26 DIAGNOSIS — R0789 Other chest pain: Secondary | ICD-10-CM | POA: Diagnosis not present

## 2021-09-26 DIAGNOSIS — E876 Hypokalemia: Secondary | ICD-10-CM | POA: Diagnosis not present

## 2021-09-26 DIAGNOSIS — K209 Esophagitis, unspecified without bleeding: Secondary | ICD-10-CM | POA: Diagnosis not present

## 2021-09-26 DIAGNOSIS — Z20822 Contact with and (suspected) exposure to covid-19: Secondary | ICD-10-CM | POA: Diagnosis not present

## 2021-09-26 DIAGNOSIS — J9601 Acute respiratory failure with hypoxia: Secondary | ICD-10-CM | POA: Diagnosis not present

## 2021-09-26 DIAGNOSIS — F10939 Alcohol use, unspecified with withdrawal, unspecified: Secondary | ICD-10-CM | POA: Diagnosis not present

## 2021-09-26 DIAGNOSIS — E039 Hypothyroidism, unspecified: Secondary | ICD-10-CM | POA: Diagnosis not present

## 2021-09-26 DIAGNOSIS — R569 Unspecified convulsions: Secondary | ICD-10-CM | POA: Diagnosis not present

## 2021-09-27 DIAGNOSIS — J440 Chronic obstructive pulmonary disease with acute lower respiratory infection: Secondary | ICD-10-CM | POA: Diagnosis not present

## 2021-09-27 DIAGNOSIS — I10 Essential (primary) hypertension: Secondary | ICD-10-CM | POA: Diagnosis not present

## 2021-09-27 DIAGNOSIS — K219 Gastro-esophageal reflux disease without esophagitis: Secondary | ICD-10-CM | POA: Diagnosis not present

## 2021-09-27 DIAGNOSIS — F10939 Alcohol use, unspecified with withdrawal, unspecified: Secondary | ICD-10-CM | POA: Diagnosis not present

## 2021-09-27 DIAGNOSIS — Z79899 Other long term (current) drug therapy: Secondary | ICD-10-CM | POA: Diagnosis not present

## 2021-09-27 DIAGNOSIS — E039 Hypothyroidism, unspecified: Secondary | ICD-10-CM | POA: Diagnosis not present

## 2021-09-27 DIAGNOSIS — R5381 Other malaise: Secondary | ICD-10-CM | POA: Diagnosis not present

## 2021-09-27 DIAGNOSIS — G8929 Other chronic pain: Secondary | ICD-10-CM | POA: Diagnosis not present

## 2021-10-03 ENCOUNTER — Telehealth: Payer: Self-pay | Admitting: Family

## 2021-10-03 DIAGNOSIS — F10930 Alcohol use, unspecified with withdrawal, uncomplicated: Secondary | ICD-10-CM | POA: Diagnosis not present

## 2021-10-03 DIAGNOSIS — F419 Anxiety disorder, unspecified: Secondary | ICD-10-CM | POA: Diagnosis not present

## 2021-10-03 DIAGNOSIS — F32A Depression, unspecified: Secondary | ICD-10-CM | POA: Diagnosis not present

## 2021-10-03 DIAGNOSIS — I10 Essential (primary) hypertension: Secondary | ICD-10-CM | POA: Diagnosis not present

## 2021-10-03 DIAGNOSIS — K219 Gastro-esophageal reflux disease without esophagitis: Secondary | ICD-10-CM | POA: Diagnosis not present

## 2021-10-03 DIAGNOSIS — R5381 Other malaise: Secondary | ICD-10-CM | POA: Diagnosis not present

## 2021-10-03 DIAGNOSIS — G8929 Other chronic pain: Secondary | ICD-10-CM | POA: Diagnosis not present

## 2021-10-03 DIAGNOSIS — J440 Chronic obstructive pulmonary disease with acute lower respiratory infection: Secondary | ICD-10-CM | POA: Diagnosis not present

## 2021-10-05 ENCOUNTER — Ambulatory Visit (INDEPENDENT_AMBULATORY_CARE_PROVIDER_SITE_OTHER): Payer: Medicare Other

## 2021-10-05 DIAGNOSIS — J449 Chronic obstructive pulmonary disease, unspecified: Secondary | ICD-10-CM

## 2021-10-05 DIAGNOSIS — F1020 Alcohol dependence, uncomplicated: Secondary | ICD-10-CM

## 2021-10-05 DIAGNOSIS — J690 Pneumonitis due to inhalation of food and vomit: Secondary | ICD-10-CM

## 2021-10-05 DIAGNOSIS — E039 Hypothyroidism, unspecified: Secondary | ICD-10-CM

## 2021-10-05 DIAGNOSIS — E785 Hyperlipidemia, unspecified: Secondary | ICD-10-CM

## 2021-10-05 DIAGNOSIS — F32A Depression, unspecified: Secondary | ICD-10-CM

## 2021-10-05 DIAGNOSIS — J9601 Acute respiratory failure with hypoxia: Secondary | ICD-10-CM

## 2021-10-05 DIAGNOSIS — I10 Essential (primary) hypertension: Secondary | ICD-10-CM

## 2021-10-05 DIAGNOSIS — K219 Gastro-esophageal reflux disease without esophagitis: Secondary | ICD-10-CM

## 2021-10-05 DIAGNOSIS — F419 Anxiety disorder, unspecified: Secondary | ICD-10-CM

## 2021-10-05 DIAGNOSIS — Z9181 History of falling: Secondary | ICD-10-CM

## 2021-10-05 DIAGNOSIS — Z79891 Long term (current) use of opiate analgesic: Secondary | ICD-10-CM

## 2021-10-06 ENCOUNTER — Inpatient Hospital Stay: Payer: Medicare Other | Admitting: Family

## 2021-10-06 ENCOUNTER — Encounter: Payer: Self-pay | Admitting: Family

## 2021-10-09 ENCOUNTER — Encounter (HOSPITAL_COMMUNITY): Payer: Self-pay | Admitting: Emergency Medicine

## 2021-10-09 ENCOUNTER — Other Ambulatory Visit: Payer: Self-pay

## 2021-10-09 ENCOUNTER — Inpatient Hospital Stay (HOSPITAL_COMMUNITY)
Admission: EM | Admit: 2021-10-09 | Discharge: 2021-10-11 | DRG: 897 | Disposition: A | Discharge status: Home / Self Care | Payer: Medicare Other | Attending: Family Medicine | Admitting: Family Medicine

## 2021-10-09 DIAGNOSIS — R112 Nausea with vomiting, unspecified: Secondary | ICD-10-CM | POA: Diagnosis not present

## 2021-10-09 DIAGNOSIS — J449 Chronic obstructive pulmonary disease, unspecified: Secondary | ICD-10-CM | POA: Diagnosis present

## 2021-10-09 DIAGNOSIS — D509 Iron deficiency anemia, unspecified: Secondary | ICD-10-CM | POA: Diagnosis present

## 2021-10-09 DIAGNOSIS — D72829 Elevated white blood cell count, unspecified: Secondary | ICD-10-CM | POA: Diagnosis present

## 2021-10-09 DIAGNOSIS — Z79899 Other long term (current) drug therapy: Secondary | ICD-10-CM | POA: Diagnosis not present

## 2021-10-09 DIAGNOSIS — R52 Pain, unspecified: Secondary | ICD-10-CM | POA: Diagnosis not present

## 2021-10-09 DIAGNOSIS — Z823 Family history of stroke: Secondary | ICD-10-CM

## 2021-10-09 DIAGNOSIS — Z8249 Family history of ischemic heart disease and other diseases of the circulatory system: Secondary | ICD-10-CM

## 2021-10-09 DIAGNOSIS — E039 Hypothyroidism, unspecified: Secondary | ICD-10-CM | POA: Diagnosis present

## 2021-10-09 DIAGNOSIS — K219 Gastro-esophageal reflux disease without esophagitis: Secondary | ICD-10-CM | POA: Diagnosis present

## 2021-10-09 DIAGNOSIS — F321 Major depressive disorder, single episode, moderate: Secondary | ICD-10-CM

## 2021-10-09 DIAGNOSIS — I10 Essential (primary) hypertension: Secondary | ICD-10-CM | POA: Diagnosis present

## 2021-10-09 DIAGNOSIS — F10231 Alcohol dependence with withdrawal delirium: Principal | ICD-10-CM | POA: Diagnosis present

## 2021-10-09 DIAGNOSIS — F419 Anxiety disorder, unspecified: Secondary | ICD-10-CM

## 2021-10-09 DIAGNOSIS — Z87891 Personal history of nicotine dependence: Secondary | ICD-10-CM | POA: Diagnosis not present

## 2021-10-09 DIAGNOSIS — Z825 Family history of asthma and other chronic lower respiratory diseases: Secondary | ICD-10-CM | POA: Diagnosis not present

## 2021-10-09 DIAGNOSIS — E876 Hypokalemia: Secondary | ICD-10-CM | POA: Diagnosis present

## 2021-10-09 DIAGNOSIS — F1010 Alcohol abuse, uncomplicated: Secondary | ICD-10-CM | POA: Diagnosis not present

## 2021-10-09 DIAGNOSIS — Y900 Blood alcohol level of less than 20 mg/100 ml: Secondary | ICD-10-CM | POA: Diagnosis present

## 2021-10-09 DIAGNOSIS — Z833 Family history of diabetes mellitus: Secondary | ICD-10-CM | POA: Diagnosis not present

## 2021-10-09 DIAGNOSIS — Z79890 Hormone replacement therapy (postmenopausal): Secondary | ICD-10-CM | POA: Diagnosis not present

## 2021-10-09 DIAGNOSIS — Z800 Family history of malignant neoplasm of digestive organs: Secondary | ICD-10-CM | POA: Diagnosis not present

## 2021-10-09 DIAGNOSIS — R1084 Generalized abdominal pain: Secondary | ICD-10-CM | POA: Diagnosis not present

## 2021-10-09 DIAGNOSIS — D75839 Thrombocytosis, unspecified: Secondary | ICD-10-CM | POA: Diagnosis present

## 2021-10-09 DIAGNOSIS — I959 Hypotension, unspecified: Secondary | ICD-10-CM | POA: Diagnosis not present

## 2021-10-09 DIAGNOSIS — Z90710 Acquired absence of both cervix and uterus: Secondary | ICD-10-CM | POA: Diagnosis not present

## 2021-10-09 DIAGNOSIS — R069 Unspecified abnormalities of breathing: Secondary | ICD-10-CM | POA: Diagnosis not present

## 2021-10-09 DIAGNOSIS — F410 Panic disorder [episodic paroxysmal anxiety] without agoraphobia: Secondary | ICD-10-CM | POA: Diagnosis present

## 2021-10-09 LAB — COMPREHENSIVE METABOLIC PANEL
ALT: 27 U/L (ref 0–44)
AST: 40 U/L (ref 15–41)
Albumin: 3.9 g/dL (ref 3.5–5.0)
Alkaline Phosphatase: 75 U/L (ref 38–126)
Anion gap: 16 — ABNORMAL HIGH (ref 5–15)
BUN: 17 mg/dL (ref 8–23)
CO2: 20 mmol/L — ABNORMAL LOW (ref 22–32)
Calcium: 8.2 mg/dL — ABNORMAL LOW (ref 8.9–10.3)
Chloride: 104 mmol/L (ref 98–111)
Creatinine, Ser: 0.9 mg/dL (ref 0.44–1.00)
GFR, Estimated: >60 mL/min (ref >60)
Glucose, Bld: 110 mg/dL — ABNORMAL HIGH (ref 70–99)
Potassium: 3.4 mmol/L — ABNORMAL LOW (ref 3.5–5.1)
Sodium: 140 mmol/L (ref 135–145)
Total Bilirubin: 1.7 mg/dL — ABNORMAL HIGH (ref 0.3–1.2)
Total Protein: 6.6 g/dL (ref 6.5–8.1)

## 2021-10-09 LAB — CBC WITH DIFFERENTIAL/PLATELET
Abs Immature Granulocytes: 0.08 10*3/uL — ABNORMAL HIGH (ref 0.00–0.07)
Basophils Absolute: 0.1 10*3/uL (ref 0.0–0.1)
Basophils Relative: 0 %
Eosinophils Absolute: 0 10*3/uL (ref 0.0–0.5)
Eosinophils Relative: 0 %
HCT: 33.6 % — ABNORMAL LOW (ref 36.0–46.0)
Hemoglobin: 11.2 g/dL — ABNORMAL LOW (ref 12.0–15.0)
Immature Granulocytes: 1 %
Lymphocytes Relative: 10 %
Lymphs Abs: 1.6 10*3/uL (ref 0.7–4.0)
MCH: 26.1 pg (ref 26.0–34.0)
MCHC: 33.3 g/dL (ref 30.0–36.0)
MCV: 78.3 fL — ABNORMAL LOW (ref 80.0–100.0)
Monocytes Absolute: 0.8 10*3/uL (ref 0.1–1.0)
Monocytes Relative: 5 %
Neutro Abs: 13.6 10*3/uL — ABNORMAL HIGH (ref 1.7–7.7)
Neutrophils Relative %: 84 %
Platelets: 515 10*3/uL — ABNORMAL HIGH (ref 150–400)
RBC: 4.29 MIL/uL (ref 3.87–5.11)
RDW: 18.6 % — ABNORMAL HIGH (ref 11.5–15.5)
WBC: 16.1 10*3/uL — ABNORMAL HIGH (ref 4.0–10.5)
nRBC: 0 % (ref 0.0–0.2)

## 2021-10-09 LAB — ETHANOL: Alcohol, Ethyl (B): <10 mg/dL (ref <10)

## 2021-10-09 LAB — LIPASE, BLOOD: Lipase: 26 U/L (ref 11–51)

## 2021-10-09 MED ORDER — ENOXAPARIN SODIUM 40 MG/0.4ML IJ SOSY
40.0000 mg | PREFILLED_SYRINGE | INTRAMUSCULAR | Status: DC
  Administered 2021-10-10 – 2021-10-11 (×2): 40 mg via SUBCUTANEOUS
  Filled 2021-10-09 (×2): qty 0.4

## 2021-10-09 MED ORDER — LORAZEPAM 2 MG/ML IJ SOLN
1.0000 mg | Freq: Once | INTRAMUSCULAR | Status: AC
  Administered 2021-10-09: 1 mg via INTRAVENOUS
  Filled 2021-10-09: qty 1

## 2021-10-09 MED ORDER — THIAMINE HCL 100 MG PO TABS
100.0000 mg | ORAL_TABLET | Freq: Every day | ORAL | Status: DC
  Administered 2021-10-10 – 2021-10-11 (×2): 100 mg via ORAL
  Filled 2021-10-09 (×2): qty 1

## 2021-10-09 MED ORDER — LORAZEPAM 2 MG/ML IJ SOLN
1.0000 mg | INTRAMUSCULAR | Status: DC | PRN
  Administered 2021-10-10: 2 mg via INTRAVENOUS
  Filled 2021-10-09: qty 1

## 2021-10-09 MED ORDER — SODIUM CHLORIDE 0.9 % IV BOLUS
1000.0000 mL | Freq: Once | INTRAVENOUS | Status: AC
  Administered 2021-10-09: 1000 mL via INTRAVENOUS

## 2021-10-09 MED ORDER — LORAZEPAM 2 MG/ML IJ SOLN
0.5000 mg | Freq: Once | INTRAMUSCULAR | Status: AC
  Administered 2021-10-09: 0.5 mg via INTRAVENOUS
  Filled 2021-10-09: qty 1

## 2021-10-09 MED ORDER — OXYCODONE-ACETAMINOPHEN 5-325 MG PO TABS
1.0000 | ORAL_TABLET | Freq: Once | ORAL | Status: AC
  Administered 2021-10-09: 1 via ORAL
  Filled 2021-10-09: qty 1

## 2021-10-09 MED ORDER — LORAZEPAM 1 MG PO TABS
1.0000 mg | ORAL_TABLET | ORAL | Status: DC | PRN
  Administered 2021-10-10 (×2): 2 mg via ORAL
  Administered 2021-10-11: 1 mg via ORAL
  Filled 2021-10-09: qty 2
  Filled 2021-10-09: qty 1
  Filled 2021-10-09: qty 2

## 2021-10-09 MED ORDER — ONDANSETRON HCL 4 MG/2ML IJ SOLN
4.0000 mg | Freq: Once | INTRAMUSCULAR | Status: AC
  Administered 2021-10-09: 4 mg via INTRAVENOUS
  Filled 2021-10-09: qty 2

## 2021-10-09 MED ORDER — FOLIC ACID 1 MG PO TABS
1.0000 mg | ORAL_TABLET | Freq: Every day | ORAL | Status: DC
  Administered 2021-10-10 – 2021-10-11 (×2): 1 mg via ORAL
  Filled 2021-10-09 (×2): qty 1

## 2021-10-09 MED ORDER — THIAMINE HCL 100 MG/ML IJ SOLN: 100.0000 mg | Freq: Every day | INTRAMUSCULAR | Status: DC

## 2021-10-09 MED ORDER — ACETAMINOPHEN 325 MG PO TABS
650.0000 mg | ORAL_TABLET | Freq: Four times a day (QID) | ORAL | Status: DC | PRN
  Filled 2021-10-09: qty 2

## 2021-10-09 NOTE — ED Triage Notes (Signed)
Pt to the ED [facility] with complaints of nausea and vomiting after drinking 1/5 of Vodka last night. ? ?VSS. EMS reports the patient vomited once in route and may have had some tremors. Pt states she has a headache. ? ?  ?

## 2021-10-09 NOTE — ED Notes (Signed)
Phlebotomy at bedside.

## 2021-10-09 NOTE — H&P (Addendum)
History and Physical    Patient: Stacy Dalton UUV:[id number] DOB: [birth year] DOA: 10/09/2021 DOS: the patient was seen and examined on 10/10/2021 PCP: Junie Spencer, FNP  Patient coming from: Home  Chief Complaint:  Chief Complaint  Patient presents with   Emesis   HPI: Stacy Dalton is a 71 y.o. female with medical history significant of hypertension, COPD, GERD, hypothyroidism, alcohol abuse and depression who presents to the emergency department with complaint of nausea and vomiting.  Patient states that she drank about 1/5 of vodka last night and she started to have nausea and nonbloody vomiting this morning, she states that she has had several episodes of vomiting and now complaining of being tired.  EMS was activated and patient vomited x1 en route to the ED, she also complaining of headache and back pain (chronic).  She denies fever, chills, chest pain, shortness of breath, blurry vision, abdominal pain.  ED Course:  In the emergency department, she was tachycardic, BP was 155/75, but other vital signs were within normal range.  Work-up in the ED showed leukocytosis, microcytic anemia, thrombocytosis, hypokalemia, lipase 26, alcohol level was less than 10 Patient was treated with IV Ativan due to alcohol withdrawal.  Hospitalist was asked to admit.  For further evaluation and management  Review of Systems: Review of systems as noted in the HPI. All other systems reviewed and are negative.   Past Medical History:  Diagnosis Date   Chronic pain    COPD (chronic obstructive pulmonary disease) (HCC)    told has copd, no current inhaler use   Cough    Depression    ETOH abuse    GERD (gastroesophageal reflux disease)    Hypertension    Hypothyroidism    Insomnia    Migraine    Migraine    Osteopenia    Pain management    Panic attacks    Small bowel obstruction [facility])    Past Surgical History:  Procedure Laterality Date   ABDOMINAL HYSTERECTOMY     BIOPSY   08/29/2021   Procedure: BIOPSY;  Surgeon: Lanelle Bal, DO;  Location: [facility];  Service: Endoscopy;;   COLON SURGERY     COLOSTOMY CLOSURE  04/19/2012   Procedure: COLOSTOMY CLOSURE;  Surgeon: Ernestene Mention, MD;  Location: [facility] ORS;  Service: General;  Laterality: N/A;  Laparotomy, Resection and Closure of Colostomy   COLOSTOMY TAKEDOWN N/A 04/12/2016   Procedure: Luz Brazen TAKEDOWN;  Surgeon: Luretha Murphy, MD;  Location: [facility] ORS;  Service: General;  Laterality: N/A;   ESOPHAGOGASTRODUODENOSCOPY (EGD) WITH PROPOFOL N/A 08/29/2021   Procedure: ESOPHAGOGASTRODUODENOSCOPY (EGD) WITH PROPOFOL;  Surgeon: Lanelle Bal, DO;  Location: [facility];  Service: Endoscopy;  Laterality: N/A;   INCONTINENCE SURGERY     LAPAROTOMY  10/04/2011, colostomy also   Procedure: EXPLORATORY LAPAROTOMY;  Surgeon: Ernestene Mention, MD;  Location: [facility];  Service: General;  Laterality: N/A;  left partial colectomy with colostomy   LAPAROTOMY  04/19/2012   Procedure: EXPLORATORY LAPAROTOMY;  Surgeon: Ernestene Mention, MD;  Location: [facility];  Service: General;  Laterality: N/A;   LAPAROTOMY N/A 11/29/2015   Procedure: EXPLORATORY LAPAROTOMY; SUBTOTAL COLECTOMY WITH HARTMAN PROCEDURE AND END COLOSTOMY;  Surgeon: Luretha Murphy, MD;  Location: [facility];  Service: General;  Laterality: N/A;   TUBAL LIGATION     VENTRAL HERNIA REPAIR  04/19/2012   Procedure: HERNIA REPAIR VENTRAL ADULT;  Surgeon: Ernestene Mention, MD;  Location: [facility];  Service:  General;  Laterality: N/A;    Social History:  reports that she quit smoking about 23 years ago. Her smoking use included cigarettes. She has a 30.00 pack-year smoking history. She has never used smokeless tobacco. She reports current alcohol use. She reports that she does not use drugs.   Allergies  Allergen Reactions   Librium [Chlordiazepoxide]     Became unresponsive   Toradol [Ketorolac Tromethamine] Shortness Of Breath   Butalbital-Apap-Caffeine Other (See  Comments)    jittery   Demerol Swelling   Esgic [Butalbital-Apap-Caffeine] Other (See Comments)    jittery   Iodine Other (See Comments)    bp bottomed out per pt several hours later; unsure if pre medicated in past with cm; [facility]   Pyridium [Phenazopyridine] Nausea Only    Family History  Problem Relation Age of Onset   Heart disease Mother    Hypertension Mother    Cancer Sister        sinus   Diabetes Brother    Heart disease Brother    Thyroid disease Brother    Cancer Sister        abdominal ?   Thyroid disease Sister    Cancer Other        GE junction adenocarcinoma   Emphysema Father    Stroke Father    Hypertension Father    Heart disease Father    COPD Sister    Thyroid disease Sister    Thyroid disease Sister    Diabetes Brother    Thyroid disease Brother    Thyroid disease Brother    Hypertension Brother    Post-traumatic stress disorder Brother    COPD Brother    Thyroid disease Brother    Thyroid disease Brother    Hypertension Brother    Transient ischemic attack Brother      Prior to Admission medications   Medication Sig Start Date End Date Taking? Authorizing Provider  acetaminophen (TYLENOL) 500 MG tablet Take by mouth. 07/13/21  Yes [provider]  albuterol (VENTOLIN HFA) 108 (90 Base) MCG/ACT inhaler Inhale 2 puffs into the lungs every 4 (four) hours as needed for wheezing or shortness of breath. 08/21/21  Yes Pollina, Canary Brim, MD  amLODipine (NORVASC) 10 MG tablet Take 1 tablet (10 mg total) by mouth daily. 08/30/21  Yes Tat, Onalee Hua, MD  DULoxetine (CYMBALTA) 60 MG capsule Take 1 capsule (60 mg total) by mouth daily. 08/08/21  Yes Hawks, Christy A, FNP  hydrOXYzine (VISTARIL) 25 MG capsule Take 25 mg by mouth every 4 (four) hours as needed for anxiety. 07/13/21  Yes [provider]  losartan (COZAAR) 50 MG tablet Take 1 tablet (50 mg total) by mouth daily. 09/24/21  Yes Tat, Onalee Hua, MD  amoxicillin-clavulanate  (AUGMENTIN) 875-125 MG tablet Take 1 tablet by mouth every 12 (twelve) hours. X 6 days Patient not taking: Reported on 10/09/2021 09/23/21   Catarina Hartshorn, MD  folic acid (FOLVITE) 1 MG tablet Take 1 tablet (1 mg total) by mouth daily. Patient not taking: Reported on 09/10/2021 05/22/21   Regalado, Jon Billings A, MD  HYDROcodone-acetaminophen (NORCO/VICODIN) 5-325 MG tablet Take 1 tablet by mouth every 6 (six) hours as needed for moderate pain. Patient not taking: Reported on 10/09/2021 09/23/21   Catarina Hartshorn, MD  levothyroxine (SYNTHROID) 50 MCG tablet Take 1 tablet (50 mcg total) by mouth daily before breakfast. Patient not taking: Reported on 10/09/2021 06/22/20   Jannifer Rodney A, FNP  magnesium oxide (MAG-OX) 400 (240  Mg) MG tablet Take 1 tablet (400 mg total) by mouth 2 (two) times daily. Patient not taking: Reported on 10/09/2021 09/23/21   Catarina Hartshorn, MD  metoprolol tartrate (LOPRESSOR) 25 MG tablet Take 0.5 tablets (12.5 mg total) by mouth 2 (two) times daily. Patient not taking: Reported on 10/09/2021 09/23/21   Catarina Hartshorn, MD  Multiple Vitamin (MULTIVITAMIN WITH MINERALS) TABS tablet Take 1 tablet by mouth daily. Patient not taking: Reported on 09/10/2021 05/22/21   Regalado, Jon Billings A, MD  pantoprazole (PROTONIX) 40 MG tablet Take 1 tablet (40 mg total) by mouth 2 (two) times daily. Patient not taking: Reported on 10/09/2021 08/29/21   Catarina Hartshorn, MD  sucralfate (CARAFATE) 1 GM/10ML suspension Take 10 mLs (1 g total) by mouth 4 (four) times daily -  with meals and at bedtime. Patient not taking: Reported on 10/09/2021 08/29/21   Catarina Hartshorn, MD  thiamine 100 MG tablet Take 1 tablet (100 mg total) by mouth daily. Patient not taking: Reported on 09/10/2021 05/22/21   Regalado, Jon Billings A, MD  vitamin B-12 (CYANOCOBALAMIN) 1000 MCG tablet Take 1 tablet (1,000 mcg total) by mouth daily. Patient not taking: Reported on 09/10/2021 05/21/21   Alba Cory, MD    Physical Exam: BP (!) 149/73 (BP Location: Right Arm)    Pulse (!) 102   Temp 98.1 F (36.7 C) (Oral)   Resp 18   Ht 5\' 1"  (1.549 m)   Wt 55.2 kg   SpO2 100%   BMI 22.99 kg/m   General: 71 y.o. year-old female ill appearing, but in no acute distress.  Alert and oriented x3. HEENT: NCAT, EOMI Neck: Supple, trachea medial Cardiovascular: Tachycardia.  Regular rate and rhythm with no rubs or gallops.  No thyromegaly or JVD noted.  No lower extremity edema. 2/4 pulses in all 4 extremities. Respiratory: Clear to auscultation with no wheezes or rales. Good inspiratory effort. Abdomen: Soft, nontender nondistended with normal bowel sounds x4 quadrants. Muskuloskeletal: No cyanosis, clubbing or edema noted bilaterally Neuro: CN II-XII intact, sensation, reflexes intact Skin: No ulcerative lesions noted or rashes Psychiatry:  Mood is appropriate for condition and setting          Labs on Admission:  Basic Metabolic Panel: Recent Labs  Lab 10/09/21 1837  NA 140  K 3.4*  CL 104  CO2 20*  GLUCOSE 110*  BUN 17  CREATININE 0.90  CALCIUM 8.2*   Liver Function Tests: Recent Labs  Lab 10/09/21 1837  AST 40  ALT 27  ALKPHOS 75  BILITOT 1.7*  PROT 6.6  ALBUMIN 3.9   Recent Labs  Lab 10/09/21 1837  LIPASE 26   No results for input(s): AMMONIA in the last 168 hours. CBC: Recent Labs  Lab 10/09/21 1641  WBC 16.1*  NEUTROABS 13.6*  HGB 11.2*  HCT 33.6*  MCV 78.3*  PLT 515*   Cardiac Enzymes: No results for input(s): CKTOTAL, CKMB, CKMBINDEX, TROPONINI in the last 168 hours.  BNP (last 3 results) Recent Labs    04/14/21 0842 08/20/21 2327 09/22/21 0400  BNP 22.0 74.0 129.0*    ProBNP (last 3 results) No results for input(s): PROBNP in the last 8760 hours.  CBG: No results for input(s): GLUCAP in the last 168 hours.  Radiological Exams on Admission: No results found.  EKG: I independently viewed the EKG done and my findings are as followed: EKG was not done in the ED  Assessment/Plan Present on Admission:   Intractable nausea and vomiting  Hypothyroidism  Leukocytosis  COPD (chronic obstructive pulmonary disease) (HCC)  Alcohol abuse  Principal Problem:   Intractable nausea and vomiting Active Problems:   COPD (chronic obstructive pulmonary disease) (HCC)   Hypothyroidism   Alcohol abuse   Leukocytosis   GERD (gastroesophageal reflux disease)  Intractable nausea and vomiting in the setting of alcohol withdrawal Alcohol abuse Alcohol level was < 10 Continue CIWA protocol, thiamine, folic acid Continue IV Zofran as needed Continue fall precaution and neuro checks Patient will be counseled on alcohol withdrawal cessation when more stable  Microcytic anemia MCV 78.3, H/H11.2/33.6 Iron studies will be checked  Thrombocytosis possibly reactive Platelets 515, no sign of bleeding. Continue to monitor platelet levels with morning labs  Hypokalemia K+ 3.4, this will be replenished  Hypothyroidism Continue Synthroid  Hypertension (uncontrolled) Continue amlodipine  COPD Continue albuterol  GERD Continue on Protonix  DVT prophylaxis: Lovenox   Advance Care Planning:   Code Status: Full Code   Consults: None  Family Communication: None at bedside  Severity of Illness: The appropriate patient status for this patient is INPATIENT. Inpatient status is judged to be reasonable and necessary in order to provide the required intensity of service to ensure the patient's safety. The patient's presenting symptoms, physical exam findings, and initial radiographic and laboratory data in the context of their chronic comorbidities is felt to place them at high risk for further clinical deterioration. Furthermore, it is not anticipated that the patient will be medically stable for discharge from the hospital within 2 midnights of admission.   * I certify that at the point of admission it is my clinical judgment that the patient will require inpatient hospital care spanning beyond 2  midnights from the point of admission due to high intensity of service, high risk for further deterioration and high frequency of surveillance required.*  Author: Frankey Shown, DO 10/10/2021 3:12 AM  For on call review [email or web address].

## 2021-10-10 ENCOUNTER — Telehealth: Payer: Self-pay | Admitting: Family

## 2021-10-10 DIAGNOSIS — R112 Nausea with vomiting, unspecified: Secondary | ICD-10-CM | POA: Diagnosis not present

## 2021-10-10 DIAGNOSIS — K219 Gastro-esophageal reflux disease without esophagitis: Secondary | ICD-10-CM

## 2021-10-10 LAB — CBC
HCT: 34.4 % — ABNORMAL LOW (ref 36.0–46.0)
Hemoglobin: 10.9 g/dL — ABNORMAL LOW (ref 12.0–15.0)
MCH: 25.2 pg — ABNORMAL LOW (ref 26.0–34.0)
MCHC: 31.7 g/dL (ref 30.0–36.0)
MCV: 79.4 fL — ABNORMAL LOW (ref 80.0–100.0)
Platelets: 372 10*3/uL (ref 150–400)
RBC: 4.33 MIL/uL (ref 3.87–5.11)
RDW: 18.5 % — ABNORMAL HIGH (ref 11.5–15.5)
WBC: 8 10*3/uL (ref 4.0–10.5)
nRBC: 0 % (ref 0.0–0.2)

## 2021-10-10 LAB — IRON AND TIBC
Iron: 360 ug/dL — ABNORMAL HIGH (ref 28–170)
Saturation Ratios: 85 % — ABNORMAL HIGH (ref 10.4–31.8)
TIBC: 421 ug/dL (ref 250–450)
UIBC: 61 ug/dL

## 2021-10-10 LAB — COMPREHENSIVE METABOLIC PANEL
ALT: 22 U/L (ref 0–44)
AST: 35 U/L (ref 15–41)
Albumin: 3.8 g/dL (ref 3.5–5.0)
Alkaline Phosphatase: 73 U/L (ref 38–126)
Anion gap: 13 (ref 5–15)
BUN: 9 mg/dL (ref 8–23)
CO2: 25 mmol/L (ref 22–32)
Calcium: 8.7 mg/dL — ABNORMAL LOW (ref 8.9–10.3)
Chloride: 101 mmol/L (ref 98–111)
Creatinine, Ser: 0.78 mg/dL (ref 0.44–1.00)
GFR, Estimated: >60 mL/min (ref >60)
Glucose, Bld: 104 mg/dL — ABNORMAL HIGH (ref 70–99)
Potassium: 3.3 mmol/L — ABNORMAL LOW (ref 3.5–5.1)
Sodium: 139 mmol/L (ref 135–145)
Total Bilirubin: 1.3 mg/dL — ABNORMAL HIGH (ref 0.3–1.2)
Total Protein: 6.4 g/dL — ABNORMAL LOW (ref 6.5–8.1)

## 2021-10-10 LAB — MAGNESIUM: Magnesium: 1.6 mg/dL — ABNORMAL LOW (ref 1.7–2.4)

## 2021-10-10 LAB — FERRITIN: Ferritin: 35 ng/mL (ref 11–307)

## 2021-10-10 LAB — APTT: aPTT: 28 seconds (ref 24–36)

## 2021-10-10 MED ORDER — LEVOTHYROXINE SODIUM 50 MCG PO TABS
50.0000 ug | ORAL_TABLET | Freq: Every day | ORAL | Status: DC
  Administered 2021-10-11: 50 ug via ORAL
  Filled 2021-10-10: qty 1

## 2021-10-10 MED ORDER — POTASSIUM CHLORIDE CRYS ER 20 MEQ PO TBCR
40.0000 meq | EXTENDED_RELEASE_TABLET | Freq: Once | ORAL | Status: AC
  Administered 2021-10-10: 40 meq via ORAL
  Filled 2021-10-10: qty 2

## 2021-10-10 MED ORDER — LABETALOL HCL 5 MG/ML IV SOLN: 10.0000 mg | INTRAVENOUS | Status: DC | PRN

## 2021-10-10 MED ORDER — AMLODIPINE BESYLATE 5 MG PO TABS
10.0000 mg | ORAL_TABLET | Freq: Every day | ORAL | Status: DC
  Administered 2021-10-11: 10 mg via ORAL
  Filled 2021-10-10: qty 2

## 2021-10-10 MED ORDER — METOPROLOL TARTRATE 25 MG PO TABS
25.0000 mg | ORAL_TABLET | Freq: Two times a day (BID) | ORAL | Status: DC
  Administered 2021-10-10 – 2021-10-11 (×3): 25 mg via ORAL
  Filled 2021-10-10 (×3): qty 1

## 2021-10-10 MED ORDER — POTASSIUM CHLORIDE CRYS ER 20 MEQ PO TBCR
40.0000 meq | EXTENDED_RELEASE_TABLET | ORAL | Status: AC
  Administered 2021-10-10 (×2): 40 meq via ORAL
  Filled 2021-10-10 (×2): qty 2

## 2021-10-10 MED ORDER — MAGNESIUM SULFATE 2 GM/50ML IV SOLN
2.0000 g | Freq: Once | INTRAVENOUS | Status: AC
Start: 2021-10-10 — End: 2021-10-10
  Administered 2021-10-10: 2 g via INTRAVENOUS
  Filled 2021-10-10: qty 50

## 2021-10-10 MED ORDER — ALBUTEROL SULFATE HFA 108 (90 BASE) MCG/ACT IN AERS: 2.0000 | INHALATION_SPRAY | RESPIRATORY_TRACT | Status: DC | PRN

## 2021-10-10 MED ORDER — DIAZEPAM 2 MG PO TABS
2.0000 mg | ORAL_TABLET | Freq: Three times a day (TID) | ORAL | Status: DC
  Administered 2021-10-10 (×2): 2 mg via ORAL
  Filled 2021-10-10 (×2): qty 1

## 2021-10-10 MED ORDER — SODIUM CHLORIDE 0.9 % IV SOLN: INTRAVENOUS | Status: DC

## 2021-10-10 MED ORDER — DIAZEPAM 5 MG PO TABS
5.0000 mg | ORAL_TABLET | Freq: Once | ORAL | Status: AC
Start: 2021-10-10 — End: 2021-10-10
  Administered 2021-10-10: 5 mg via ORAL
  Filled 2021-10-10: qty 1

## 2021-10-10 MED ORDER — ONDANSETRON HCL 4 MG/2ML IJ SOLN: 4.0000 mg | Freq: Four times a day (QID) | INTRAMUSCULAR | Status: DC | PRN

## 2021-10-10 MED ORDER — PANTOPRAZOLE SODIUM 40 MG PO TBEC
40.0000 mg | DELAYED_RELEASE_TABLET | Freq: Two times a day (BID) | ORAL | Status: DC
Start: 2021-10-10 — End: 2021-10-11
  Administered 2021-10-10 – 2021-10-11 (×2): 40 mg via ORAL
  Filled 2021-10-10 (×2): qty 1

## 2021-10-10 MED ORDER — ALBUTEROL SULFATE (2.5 MG/3ML) 0.083% IN NEBU: 2.5000 mg | INHALATION_SOLUTION | RESPIRATORY_TRACT | Status: DC | PRN

## 2021-10-10 MED ORDER — PANTOPRAZOLE SODIUM 40 MG PO TBEC: 40.0000 mg | DELAYED_RELEASE_TABLET | Freq: Two times a day (BID) | ORAL | Status: DC

## 2021-10-10 NOTE — Progress Notes (Signed)
?PROGRESS NOTE ? ? ? ? ?Stacy Dalton, is a 71 y.o. female, DOB - 07-23-1950, IRW:[id number] ? ?Admit date - 10/09/2021   Admitting Physician Bernadette Hoit, DO ? ?Outpatient Primary MD for the patient is [name], FNP ? ?LOS - 1 ? ?Chief Complaint  ?Patient presents with  ? Emesis  ?    ?  ?-Assessment and Plan: ? ?1) alcohol withdrawal/DTs--- continue benzos, folic acid and thiamine ?-Patient is not very sure she wants to try outpatient or inpatient alcohol rehab programs ? ?2) COPD/GERD/hypothyroidism/HTN----continue current meds ? ?3) intractable nausea and vomiting--- due to alcohol withdrawal ?-Overall resolved ?-Okay to advance diet ?-As needed Zofran ? ?4) hypokalemia and hypomagnesemia--in the setting of alcohol abuse and emesis ?-Replace and recheck ? ?5) chronic anemia--- appears microcytic and hypochromic ?-Serum iron and ferritin are not low ?-B12 and folate pending ?-No evidence of active bleeding at this time ? ? ?Disposition/Need for in-Hospital Stay- patient unable to be discharged at this time due to --alcohol withdrawal requiring IV Ativan, electrolyte abnormalities required replaced ? ?Status is: Inpatient  ? ?Disposition: The patient is from: Home ?             Anticipated d/c is to: Home ?             Anticipated d/c date is: 2 days ?             Patient currently is not medically stable to d/c. ?Barriers: Not Clinically Stable-  ? ?Code Status :  -  Code Status: Full Code  ? ?Family Communication:   NA (patient is alert, awake and coherent) = ? ?DVT Prophylaxis  :   - SCDs   enoxaparin (LOVENOX) injection 40 mg Start: 10/10/21 1000 ?SCDs Start: 10/09/21 2332 ? ? ?Lab Results  ?Component Value Date  ? PLT 372 10/10/2021  ? ? ?Inpatient Medications ? ?Scheduled Meds: ? diazepam  5 mg Oral Once  ? enoxaparin (LOVENOX) injection  40 mg Subcutaneous P61P  ? folic acid  1 mg Oral Daily  ? metoprolol tartrate  25 mg Oral BID  ? thiamine  100 mg Oral Daily  ? Or  ? thiamine  100 mg Intravenous  Daily  ? ?Continuous Infusions: ?PRN Meds:.acetaminophen, labetalol, LORazepam **OR** LORazepam, ondansetron (ZOFRAN) IV ? ? ?Anti-infectives (From admission, onward)  ? ? None  ? ?  ? ?  ? ?Subjective: ?Marguriete Wootan today has no fevers, no further emesis,  No chest pain,   ?-Continues to have restlessness and tremors ? ? ?Objective: ?Vitals:  ? 10/09/21 2230 10/09/21 2318 10/09/21 2332 10/10/21 0320  ?BP: (!) 153/75 (!) 149/73  (!) 157/77  ?Pulse: (!) 104 (!) 102  100  ?Resp: 11 18    ?Temp: 98.1 ?F (36.7 ?C) 98.1 ?F (36.7 ?C)  99.1 ?F (37.3 ?C)  ?TempSrc: Oral Oral  Oral  ?SpO2: 100% 100% 100% 97%  ?Weight:  55.2 kg    ?Height:      ? ? ?Intake/Output Summary (Last 24 hours) at 10/10/2021 1929 ?Last data filed at 10/10/2021 1300 ?Gross per 24 hour  ?Intake 2080 ml  ?Output 950 ml  ?Net 1130 ml  ? ?Filed Weights  ? 10/09/21 1541 10/09/21 2318  ?Weight: 56.9 kg 55.2 kg  ? ? ?Physical Exam ? ?Gen:- Awake Alert,  in no apparent distress  ?HEENT:- [place].AT, No sclera icterus ?Neck-Supple Neck,No JVD,.  ?Lungs-  CTAB , fair symmetrical air movement ?CV- S1, S2 normal, regular  ?Abd-  +ve  B.Sounds, Abd Soft, epigastric discomfort without rebound or guard,    ?Extremity/Skin:- No  edema, pedal pulses present  ?Psych-affect is somewhat anxious, oriented x3 ?Neuro-no new focal deficits, significant tremors ? ?Data Reviewed: I have personally reviewed following labs and imaging studies ? ?CBC: ?Recent Labs  ?Lab 10/09/21 ?1641 10/10/21 ?0923  ?WBC 16.1* 8.0  ?NEUTROABS 13.6*  --   ?HGB 11.2* 10.9*  ?HCT 33.6* 34.4*  ?MCV 78.3* 79.4*  ?PLT 515* 372  ? ?Basic Metabolic Panel: ?Recent Labs  ?Lab 10/09/21 ?3007 10/10/21 ?6226  ?NA 140 139  ?K 3.4* 3.3*  ?CL 104 101  ?CO2 20* 25  ?GLUCOSE 110* 104*  ?BUN 17 9  ?CREATININE 0.90 0.78  ?CALCIUM 8.2* 8.7*  ?MG  --  1.6*  ? ?GFR: ?Estimated Creatinine Clearance: 47.3 mL/min (by C-G formula based on SCr of 0.78 mg/dL). ?Liver Function Tests: ?Recent Labs  ?Lab 10/09/21 ?3335  10/10/21 ?4562  ?AST 40 35  ?ALT 27 22  ?ALKPHOS 75 73  ?BILITOT 1.7* 1.3*  ?PROT 6.6 6.4*  ?ALBUMIN 3.9 3.8  ? ?Cardiac Enzymes: ?No results for input(s): CKTOTAL, CKMB, CKMBINDEX, TROPONINI in the last 168 hours. ?BNP (last 3 results) ?No results for input(s): PROBNP in the last 8760 hours. ?HbA1C: ?No results for input(s): HGBA1C in the last 72 hours. ?Sepsis Labs: ?'@LABRCNTIP'$ (procalcitonin:4,lacticidven:4) ?)No results found for this or any previous visit (from the past 240 hour(s)).  ? ? ?Radiology Studies: ?No results found. ? ? ?Scheduled Meds: ? diazepam  5 mg Oral Once  ? enoxaparin (LOVENOX) injection  40 mg Subcutaneous B63S  ? folic acid  1 mg Oral Daily  ? metoprolol tartrate  25 mg Oral BID  ? thiamine  100 mg Oral Daily  ? Or  ? thiamine  100 mg Intravenous Daily  ? ?Continuous Infusions: ? ? LOS: 1 day  ? ? ?Roxan Hockey M.D on 10/10/2021 at 7:29 PM ? ?Go to [email or web address] - for contact info ? ?[facility] - Office  [phone] ? ?If 7PM-7AM, please contact night-coverage ?[email or web address] ?Password [email or web address] ?10/10/2021, 7:29 PM  ? ? ?

## 2021-10-10 NOTE — Telephone Encounter (Signed)
Tried to call patient NA and voicemail box not set up  ?

## 2021-10-10 NOTE — Plan of Care (Signed)

## 2021-10-10 NOTE — TOC Initial Note (Addendum)
Transition of Care (TOC) - Initial/Assessment Note  ? ? ?Patient Details  ?Name: Stacy Dalton ?MRN: [id number] ?Date of Birth: 06-08-1951 ? ?Transition of Care (TOC) CM/SW Contact:    ?Salome Arnt, LCSW ?Phone Number: ?10/10/2021, 9:14 AM ? ?Clinical Narrative:  Pt admitted with intractable nausea and vomiting. Assessment completed due to high risk readmission score and consult for substance abuse. Pt known to TOC from previous admissions. Pt reports she has been staying with her boyfriend or he stays with her. Pt recently d/c from hospital and per notes went to [facility]. Pt states she is back home now. She was working with [facility] for alcohol treatment, but indicates she is still drinking. Pt plans to follow up with her PCP for next steps regarding treatment. She declines resources as she has received them before and is going to follow PCP recommendations. Pt is active with [facility] PT/OT. TOC will continue to follow.                ? ? ?Expected Discharge Plan: Home/Self Care ?Barriers to Discharge: Continued Medical Work up ? ? ?Patient Goals and CMS Choice ?Patient states their goals for this hospitalization and ongoing recovery are:: return home/follow up on substance abuse treatment ?  ?Choice offered to / list presented to : Patient ? ?Expected Discharge Plan and Services ?Expected Discharge Plan: Home/Self Care ?In-house Referral: Clinical Social Work ?  ?  ?Living arrangements for the past 2 months: [place] ?                ?  ?  ?  ?  ?  ?  ?  ?  ?  ?  ? ?Prior Living Arrangements/Services ?Living arrangements for the past 2 months: [place] ?Lives with:: Significant Other ?Patient language and need for interpreter reviewed:: Yes ?Do you feel safe going back to the place where you live?: Yes      ?  ?  ?Current home services: DME ([facility]) ?Criminal Activity/Legal Involvement Pertinent to Current Situation/Hospitalization: No - Comment as needed ? ?Activities of  Daily Living ?  ?  ? ?Permission Sought/Granted ?  ?  ?   ?   ?   ?   ? ?Emotional Assessment ?  ?  ?Affect (typically observed): Appropriate ?Orientation: : Oriented to Self, Oriented to Place, Oriented to  Time, Oriented to Situation ?Alcohol / Substance Use: Alcohol Use ?Psych Involvement: No (comment) ? ?Admission diagnosis:  ETOH abuse [F10.10] ?Intractable nausea and vomiting [R11.2] ?Nausea and vomiting, unspecified vomiting type [R11.2] ?Patient Active Problem List  ? Diagnosis Date Noted  ? GERD (gastroesophageal reflux disease) 10/10/2021  ? Intractable nausea and vomiting 10/09/2021  ? Alcoholic ketoacidosis 49/20/1007  ? Hypertensive urgency 08/27/2021  ? Esophagitis 08/27/2021  ? Atypical chest pain 08/27/2021  ? Seizure-like activity ([place]) 06/06/2021  ? DTs (delirium tremens) ([place]) 06/06/2021  ? AKI (acute kidney injury) ([place]) 05/19/2021  ? Elevated LFTs 05/19/2021  ? Metabolic acidosis 06/21/7587  ? COVID-19 virus infection 04/15/2021  ?  Class: Acute  ? Hypotensive episode 04/15/2021  ?  Class: Acute  ? Physical deconditioning   ? Vitamin B12 deficiency 02/04/2021  ? Leukocytosis 11/23/2020  ? Alcohol use disorder, severe, dependence ([place]) 10/08/2020  ? Hyponatremia 09/17/2020  ? Alcohol withdrawal ([place]) 09/12/2020  ? Moderate protein malnutrition ([place]) 08/20/2020  ? Elevated SGOT ([facility]) 08/19/2020  ? Elevated troponin 08/19/2020  ? Increased serum lipase level 08/19/2020  ? Alcohol abuse 06/21/2020  ?  Acute respiratory failure with hypoxia ([place]) 09/19/2018  ? Large bowel obstruction (HCC)   ? Small bowel obstruction ([place]) 07/20/2018  ? Hypomagnesemia 07/20/2018  ? Polypharmacy 02/23/2017  ? Hypokalemia 02/23/2017  ? Acute metabolic encephalopathy 69/62/9528  ? Anxiety 02/22/2017  ? Altered mental status   ? Pain medication agreement signed 10/10/2016  ? Opioid dependence ([place]) 10/10/2016  ? Status post colostomy takedown 04/12/2016  ? Chronic constipation 12/03/2015  ? Peritonitis with abscess of  intestine ([place]) 11/29/2015  ? COPD exacerbation ([place]) 09/20/2015  ? Osteopenia 06/10/2014  ? Vitamin D deficiency 06/10/2014  ? Hypothyroidism   ? Depression 11/18/2012  ? Insomnia 11/18/2012  ? Chronic pain syndrome 11/18/2012  ? HLD (hyperlipidemia) 09/01/2012  ? S/P colostomy ([place]) 04/19/2012  ? Normocytic anemia 10/15/2011  ? Hypertension 10/13/2011  ? Chronic back pain 10/13/2011  ? COPD (chronic obstructive pulmonary disease) ([place]) 10/09/2011  ? ?PCP:  [name], FNP ?Pharmacy:   ?[id number] - [place], [place] - [address] ?[address] ?[place] [address] ?Phone: [phone] Fax: [phone] ? ? ? ? ?Social Determinants of Health (SDOH) Interventions ?  ? ?Readmission Risk Interventions ? ?  10/10/2021  ?  9:08 AM 09/19/2021  ?  2:18 PM 08/28/2021  ?  2:06 PM  ?Readmission Risk Prevention Plan  ?Transportation Screening Complete Complete   ?Medication Review Press photographer) Complete Complete Complete  ?PCP or Specialist appointment within 3-5 days of discharge  Not Complete   ?[place] or Home Care Consult Complete Complete Complete  ?SW Recovery Care/Counseling Consult Complete  Complete  ?Palliative Care Screening Not Applicable  Not Applicable  ?[place] Not Applicable    ? ? ? ?

## 2021-10-11 LAB — RENAL FUNCTION PANEL
Albumin: 3.5 g/dL (ref 3.5–5.0)
Anion gap: 9 (ref 5–15)
BUN: 9 mg/dL (ref 8–23)
CO2: 22 mmol/L (ref 22–32)
Calcium: 8.8 mg/dL — ABNORMAL LOW (ref 8.9–10.3)
Chloride: 108 mmol/L (ref 98–111)
Creatinine, Ser: 0.78 mg/dL (ref 0.44–1.00)
GFR, Estimated: >60 mL/min (ref >60)
Glucose, Bld: 116 mg/dL — ABNORMAL HIGH (ref 70–99)
Phosphorus: 2.7 mg/dL (ref 2.5–4.6)
Potassium: 3.8 mmol/L (ref 3.5–5.1)
Sodium: 139 mmol/L (ref 135–145)

## 2021-10-11 LAB — CBC
HCT: 36.9 % (ref 36.0–46.0)
Hemoglobin: 11.7 g/dL — ABNORMAL LOW (ref 12.0–15.0)
MCH: 25.9 pg — ABNORMAL LOW (ref 26.0–34.0)
MCHC: 31.7 g/dL (ref 30.0–36.0)
MCV: 81.8 fL (ref 80.0–100.0)
Platelets: 286 10*3/uL (ref 150–400)
RBC: 4.51 MIL/uL (ref 3.87–5.11)
RDW: 18.3 % — ABNORMAL HIGH (ref 11.5–15.5)
WBC: 5.8 10*3/uL (ref 4.0–10.5)
nRBC: 0 % (ref 0.0–0.2)

## 2021-10-11 LAB — FOLATE: Folate: 22.9 ng/mL (ref >5.9)

## 2021-10-11 LAB — VITAMIN B12: Vitamin B-12: 441 pg/mL (ref 180–914)

## 2021-10-11 MED ORDER — DULOXETINE HCL 60 MG PO CPEP: 60.0000 mg | ORAL_CAPSULE | Freq: Every day | ORAL | 2 refills | Status: DC

## 2021-10-11 MED ORDER — ADULT MULTIVITAMIN W/MINERALS CH: 1.0000 | ORAL_TABLET | Freq: Every day | ORAL | 5 refills | Status: DC

## 2021-10-11 MED ORDER — THIAMINE HCL 100 MG PO TABS: 100.0000 mg | ORAL_TABLET | Freq: Every day | ORAL | 5 refills | Status: DC

## 2021-10-11 MED ORDER — PANTOPRAZOLE SODIUM 40 MG PO TBEC: 40.0000 mg | DELAYED_RELEASE_TABLET | Freq: Two times a day (BID) | ORAL | 1 refills | Status: DC

## 2021-10-11 MED ORDER — MAGNESIUM OXIDE -MG SUPPLEMENT 400 (240 MG) MG PO TABS: 400.0000 mg | ORAL_TABLET | Freq: Every day | ORAL | 5 refills | Status: DC

## 2021-10-11 MED ORDER — AMLODIPINE BESYLATE 10 MG PO TABS: 10.0000 mg | ORAL_TABLET | Freq: Every day | ORAL | 3 refills | Status: DC

## 2021-10-11 MED ORDER — LOSARTAN POTASSIUM 50 MG PO TABS: 50.0000 mg | ORAL_TABLET | Freq: Every day | ORAL | 3 refills | Status: DC

## 2021-10-11 MED ORDER — FOLIC ACID 1 MG PO TABS: 1.0000 mg | ORAL_TABLET | Freq: Every day | ORAL | 5 refills | Status: DC

## 2021-10-11 MED ORDER — METOPROLOL TARTRATE 25 MG PO TABS: 25.0000 mg | ORAL_TABLET | Freq: Two times a day (BID) | ORAL | 5 refills | Status: DC

## 2021-10-11 MED ORDER — VITAMIN B-12 1000 MCG PO TABS: 1000.0000 ug | ORAL_TABLET | Freq: Every day | ORAL | 5 refills | Status: DC

## 2021-10-11 MED ORDER — HYDROXYZINE PAMOATE 25 MG PO CAPS: 25.0000 mg | ORAL_CAPSULE | ORAL | 2 refills | Status: DC | PRN

## 2021-10-11 MED ORDER — ALBUTEROL SULFATE HFA 108 (90 BASE) MCG/ACT IN AERS: 2.0000 | INHALATION_SPRAY | RESPIRATORY_TRACT | 2 refills | Status: DC | PRN

## 2021-10-11 MED ORDER — LEVOTHYROXINE SODIUM 50 MCG PO TABS: 50.0000 ug | ORAL_TABLET | Freq: Every day | ORAL | 2 refills | Status: DC

## 2021-10-11 MED ORDER — SUCRALFATE 1 GM/10ML PO SUSP
1.0000 g | Freq: Three times a day (TID) | ORAL | 1 refills | Status: DC
Start: 2021-10-11 — End: 2022-03-28

## 2021-10-11 NOTE — Care Management Important Message (Signed)
Important Message ? ?Patient Details  ?Name: Stacy Dalton ?MRN: [id number] ?Date of Birth: 1950/10/03 ? ? ?Medicare Important Message Given:  N/A - LOS <3 / Initial given by admissions ? ? ? ? ?Dannette Barbara ?10/11/2021, 8:40 AM ?

## 2021-10-11 NOTE — Telephone Encounter (Signed)
Lmtcb.

## 2021-10-11 NOTE — Discharge Instructions (Signed)
1)Avoid ibuprofen/Advil/Aleve/Motrin/Goody Powders/Naproxen/BC powders/Meloxicam/Diclofenac/Indomethacin and other Nonsteroidal anti-inflammatory medications as these will make you more likely to bleed and can cause stomach ulcers, can also cause Kidney problems.  ? ?2)Complete Abstinence from Alcohol advised --outpatient or inpatient Alcohol rehab advised-- ?

## 2021-10-11 NOTE — Consult Note (Signed)
[facility] CM Inpatient Consult ? ? ?10/11/2021 ? ?[name] ?[birth year] ?[id number] ? ?[place] [facility] CM) ?  ?Patient chart reviewed for less than 30 days readmission and extreme high risk for unplanned readmission. Per review, patient's primary provider office offer chronic disease management team and services.  ? ?Plan: Will continue to follow for progression. ? ?Of note, [facility] Care Management services does not replace or interfere with any services that are arranged by inpatient case management or social work.  ? ?Netta Cedars, MSN, RN ?[facility] Liaison ?Phone [phone] ?Toll free office [phone]  ?

## 2021-10-11 NOTE — TOC Transition Note (Signed)
Transition of Care (TOC) - CM/SW Discharge Note ? ? ?Patient Details  ?Name: Stacy Dalton ?MRN: [id number] ?Date of Birth: [birth year] ? ?Transition of Care (TOC) CM/SW Contact:  ?Shade Flood, LCSW ?Phone Number: ?10/11/2021, 12:54 PM ? ? ?Clinical Narrative:    ? ?Pt stable for dc today per MD. Pt was active with [facility] PT/OT prior to admission. Requested HH resumption orders and MD stated that is independent in her room here and that he does not feel that she requires HH for dc. Updated Santiago Glad at [facility] who states she will reach out to pt's PCP to determine if she would want to provide resumption orders. Santiago Glad states she will update pt/family.  ? ?There are no other TOC needs for dc. ? ?Final next level of care: Home/Self Care ?Barriers to Discharge: Barriers Resolved ? ? ?Patient Goals and CMS Choice ?Patient states their goals for this hospitalization and ongoing recovery are:: return home/follow up on substance abuse treatment ?  ?Choice offered to / list presented to : Patient ? ?Discharge Placement ?  ?           ?  ?  ?  ?  ? ?Discharge Plan and Services ?In-house Referral: Clinical Social Work ?  ?           ?  ?  ?  ?  ?  ?  ?  ?  ?  ?  ? ?Social Determinants of Health (SDOH) Interventions ?  ? ? ?Readmission Risk Interventions ? ?  10/10/2021  ?  9:08 AM 09/19/2021  ?  2:18 PM 08/28/2021  ?  2:06 PM  ?Readmission Risk Prevention Plan  ?Transportation Screening Complete Complete   ?Medication Review Press photographer) Complete Complete Complete  ?PCP or Specialist appointment within 3-5 days of discharge  Not Complete   ?[place] or Home Care Consult Complete Complete Complete  ?SW Recovery Care/Counseling Consult Complete  Complete  ?Palliative Care Screening Not Applicable  Not Applicable  ?[place] Not Applicable    ? ? ? ? ? ?

## 2021-10-11 NOTE — Discharge Summary (Signed)
?                                                                                ? ? ?Stacy Dalton, is a 71 y.o. female  DOB Jan 17, 1951  MRN [id number]. ? ?Admission date:  10/09/2021  Admitting Physician  Bernadette Hoit, DO ? ?Discharge Date:  10/11/2021  ? ?Primary MD  [name], FNP ? ?Recommendations for primary care physician for things to follow:  ? ?1)Avoid ibuprofen/Advil/Aleve/Motrin/Goody Powders/Naproxen/BC powders/Meloxicam/Diclofenac/Indomethacin and other Nonsteroidal anti-inflammatory medications as these will make you more likely to bleed and can cause stomach ulcers, can also cause Kidney problems.  ? ?2)Complete Abstinence from Alcohol advised --outpatient or inpatient Alcohol rehab advised-- ? ?Admission Diagnosis  ETOH abuse [F10.10] ?Intractable nausea and vomiting [R11.2] ?Nausea and vomiting, unspecified vomiting type [R11.2] ? ? ?Discharge Diagnosis  ETOH abuse [F10.10] ?Intractable nausea and vomiting [R11.2] ?Nausea and vomiting, unspecified vomiting type [R11.2]  ? ?Principal Problem: ?  Intractable nausea and vomiting ?Active Problems: ?  COPD (chronic obstructive pulmonary disease) ([place]) ?  Hypothyroidism ?  Alcohol abuse ?  Leukocytosis ?  GERD (gastroesophageal reflux disease) ?    ? ?Past Medical History:  ?Diagnosis Date  ? Chronic pain   ? COPD (chronic obstructive pulmonary disease) ([place])   ? told has copd, no current inhaler use  ? Cough   ? Depression   ? ETOH abuse   ? GERD (gastroesophageal reflux disease)   ? Hypertension   ? Hypothyroidism   ? Insomnia   ? Migraine   ? Migraine   ? Osteopenia   ? Pain management   ? Panic attacks   ? Small bowel obstruction ([place])   ? ? ?Past Surgical History:  ?Procedure Laterality Date  ? ABDOMINAL HYSTERECTOMY    ? BIOPSY  08/29/2021  ? Procedure: BIOPSY;  Surgeon: [name], DO;  [facility];  Service: Endoscopy;;  ? COLON SURGERY    ? COLOSTOMY CLOSURE  04/19/2012  ? Procedure: COLOSTOMY CLOSURE;  Surgeon:  Adin Hector, MD;  Location: [facility] OR[facility];  Service: General;  Laterality: N/A;  Laparotomy, Resection and Closure of Colostomy  ? COLOSTOMY TAKEDOWN N/A 04/12/2016  ? Procedure: [place] TAKEDOWN;  Surgeon: Johnathan Hausen, MD;  Location: [facility];  Service: General;  Laterality: N/A;  ? ESOPHAGOGASTRODUODENOSCOPY (EGD) WITH PROPOFOL N/A 08/29/2021  ? Procedure: ESOPHAGOGASTRODUODENOSCOPY (EGD) WITH PROPOFOL;  Surgeon: [name], DO;  Location: [facility];  Service: Endoscopy;  Laterality: N/A;  ? INCONTINENCE SURGERY    ? LAPAROTOMY  10/04/2011, colostomy also  ? Procedure: EXPLORATORY LAPAROTOMY;  Surgeon: Adin Hector, MD;  Location: [facility];  Service: General;  Laterality: N/A;  left partial colectomy with colostomy  ? LAPAROTOMY  04/19/2012  ? Procedure: EXPLORATORY LAPAROTOMY;  Surgeon: Adin Hector, MD;  Location: [facility];  Service: General;  Laterality: N/A;  ? LAPAROTOMY N/A 11/29/2015  ? Procedure: EXPLORATORY LAPAROTOMY; SUBTOTAL COLECTOMY WITH HARTMAN PROCEDURE AND END COLOSTOMY;  Surgeon: Johnathan Hausen, MD;  Location: [facility];  Service: General;  Laterality: N/A;  ? TUBAL LIGATION    ? VENTRAL HERNIA REPAIR  04/19/2012  ? Procedure: HERNIA REPAIR  VENTRAL ADULT;  Surgeon: Adin Hector, MD;  Location: [facility];  Service: General;  Laterality: N/A;  ? ? ? HPI  from the history and physical done on the day of admission:  ? ?  ?HPI: Stacy Dalton is a 71 y.o. female with medical history significant of hypertension, COPD, GERD, hypothyroidism, alcohol abuse and depression who presents to the emergency department with complaint of nausea and vomiting.  Patient states that she drank about 1/5 of vodka last night and she started to have nausea and nonbloody vomiting this morning, she states that she has had several episodes of vomiting and now complaining of being tired.  EMS was activated and patient vomited x1 en route to the ED, she also complaining of headache and back pain (chronic).  She  denies fever, chills, chest pain, shortness of breath, blurry vision, abdominal pain. ?  ?ED Course:  ?In the emergency department, she was tachycardic, BP was 155/75, but other vital signs were within normal range.  Work-up in the ED showed leukocytosis, microcytic anemia, thrombocytosis, hypokalemia, lipase 26, alcohol level was less than 10 ?Patient was treated with IV Ativan due to alcohol withdrawal.  Hospitalist was asked to admit.  For further evaluation and management ?  ?Review of Systems: ?Review of systems as noted in the HPI. All other systems reviewed and are negative. ? ? ? ? Hospital Course:  ? ?Assessment and Plan: ?  ?1) alcohol withdrawal/DTs---  ?Patient was treated with benzos, folic acid and thiamine ?--Alcohol withdrawal symptoms resolved  ?-Patient declines outpatient or inpatient alcohol rehab programs ?  ?2) COPD/GERD/hypothyroidism/HTN----stable, continue current meds ?  ?3) intractable nausea and vomiting--- due to alcohol withdrawal ?-Resolved ?-Patient tolerating oral intake well ?  ?4) hypokalemia and hypomagnesemia--in the setting of alcohol abuse and emesis ?-Replaced ?  ?5) chronic anemia--- appears microcytic and hypochromic ?-Serum iron and ferritin are not low ?-B12 and folate WNL ?-No evidence of active bleeding at this time ?  ? Disposition: The patient is from: Home ?             Anticipated d/c is to: Home ? ?Discharge Condition: Stable ? ?Follow UP--outpatient or inpatient alcohol rehab advised ? ?Diet and Activity recommendation:  As advised ? ?Discharge Instructions   ? ? ?Discharge Instructions   ? ? Call MD for:  difficulty breathing, headache or visual disturbances   Complete by: As directed ?  ? Call MD for:  persistant dizziness or light-headedness   Complete by: As directed ?  ? Call MD for:  persistant nausea and vomiting   Complete by: As directed ?  ? Call MD for:  temperature >100.4   Complete by: As directed ?  ? Diet - low sodium heart healthy   Complete by: As  directed ?  ? Discharge instructions   Complete by: As directed ?  ? 1)Avoid ibuprofen/Advil/Aleve/Motrin/Goody Powders/Naproxen/BC powders/Meloxicam/Diclofenac/Indomethacin and other Nonsteroidal anti-inflammatory medications as these will make you more likely to bleed and can cause stomach ulcers, can also cause Kidney problems.  ? ?2)Complete Abstinence from Alcohol advised --outpatient or inpatient Alcohol rehab advised--  ? Increase activity slowly   Complete by: As directed ?  ? ?  ? ? ? Discharge Medications  ? ?  ?Allergies as of 10/11/2021   ? ?   Reactions  ? Librium [chlordiazepoxide]   ? Became unresponsive  ? Toradol [ketorolac Tromethamine] Shortness Of Breath  ? Butalbital-apap-caffeine Other (See Comments)  ? jittery  ? Demerol Swelling  ?  Esgic [butalbital-apap-caffeine] Other (See Comments)  ? jittery  ? Iodine Other (See Comments)  ? bp bottomed out per pt several hours later; unsure if pre medicated in past with cm; done in [place]  ? Pyridium [phenazopyridine] Nausea Only  ? ?  ? ?  ?Medication List  ?  ? ?STOP taking these medications   ? ?amoxicillin-clavulanate 875-125 MG tablet ?Commonly known as: AUGMENTIN ?  ?HYDROcodone-acetaminophen 5-325 MG tablet ?Commonly known as: NORCO/VICODIN ?  ? ?  ? ?TAKE these medications   ? ?acetaminophen 500 MG tablet ?Commonly known as: TYLENOL ?Take by mouth. ?  ?albuterol 108 (90 Base) MCG/ACT inhaler ?Commonly known as: VENTOLIN HFA ?Inhale 2 puffs into the lungs every 4 (four) hours as needed for wheezing or shortness of breath. ?  ?amLODipine 10 MG tablet ?Commonly known as: NORVASC ?Take 1 tablet (10 mg total) by mouth daily. ?  ?DULoxetine 60 MG capsule ?Commonly known as: Cymbalta ?Take 1 capsule (60 mg total) by mouth daily. ?  ?folic acid 1 MG tablet ?Commonly known as: FOLVITE ?Take 1 tablet (1 mg total) by mouth daily. ?  ?hydrOXYzine 25 MG capsule ?Commonly known as: VISTARIL ?Take 1 capsule (25 mg total) by mouth every 4 (four) hours as needed  for anxiety. ?  ?levothyroxine 50 MCG tablet ?Commonly known as: Synthroid ?Take 1 tablet (50 mcg total) by mouth daily before breakfast. ?  ?losartan 50 MG tablet ?Commonly known as: COZAAR ?Take 1 tablet (50

## 2021-10-12 NOTE — Telephone Encounter (Signed)
Third call to patient to schedule Hospital follow up NA or no return call has been made back to schedule will close telephone encounter.  ?Brynda Peon CMA ?

## 2021-10-12 NOTE — ED Provider Notes (Signed)
?[facility] ?Provider Note ? ? ?CSN: [id number] ?Arrival date & time: 10/09/21  1505 ? ?  ? ?History ? ?Chief Complaint  ?Patient presents with  ? Emesis  ? ? ?Stacy Dalton is a 71 y.o. female. ? ?Patient has history of EtOH abuse.  She states she drank about 1/5 yesterday and has been vomiting ever since.  Patient also has a history of hypertension ? ?The history is provided by the patient and medical records. No language interpreter was used.  ?Emesis ?Severity:  Moderate ?Timing:  Constant ?Quality:  Bilious material ?Able to tolerate:  Liquids ?Progression:  Unchanged ?Chronicity:  Recurrent ?Recent urination:  Normal ?Relieved by:  Nothing ?Worsened by:  Nothing ?Ineffective treatments:  None tried ?Associated symptoms: no abdominal pain, no cough, no diarrhea and no headaches   ? ?  ? ?Home Medications ?Prior to Admission medications   ?Medication Sig Start Date End Date Taking? Authorizing Provider  ?acetaminophen (TYLENOL) 500 MG tablet Take by mouth. 07/13/21  Yes [provider]  ?albuterol (VENTOLIN HFA) 108 (90 Base) MCG/ACT inhaler Inhale 2 puffs into the lungs every 4 (four) hours as needed for wheezing or shortness of breath. 10/11/21   Roxan Hockey, MD  ?amLODipine (NORVASC) 10 MG tablet Take 1 tablet (10 mg total) by mouth daily. 10/11/21   Roxan Hockey, MD  ?DULoxetine (CYMBALTA) 60 MG capsule Take 1 capsule (60 mg total) by mouth daily. 10/11/21   Roxan Hockey, MD  ?folic acid (FOLVITE) 1 MG tablet Take 1 tablet (1 mg total) by mouth daily. 10/11/21   Roxan Hockey, MD  ?hydrOXYzine (VISTARIL) 25 MG capsule Take 1 capsule (25 mg total) by mouth every 4 (four) hours as needed for anxiety. 10/11/21   Roxan Hockey, MD  ?levothyroxine (SYNTHROID) 50 MCG tablet Take 1 tablet (50 mcg total) by mouth daily before breakfast. 10/11/21   Roxan Hockey, MD  ?losartan (COZAAR) 50 MG tablet Take 1 tablet (50 mg total) by mouth daily. 10/11/21   Roxan Hockey,  MD  ?magnesium oxide (MAG-OX) 400 (240 Mg) MG tablet Take 1 tablet (400 mg total) by mouth daily. 10/11/21   Roxan Hockey, MD  ?metoprolol tartrate (LOPRESSOR) 25 MG tablet Take 1 tablet (25 mg total) by mouth 2 (two) times daily. 10/11/21   Roxan Hockey, MD  ?Multiple Vitamin (MULTIVITAMIN WITH MINERALS) TABS tablet Take 1 tablet by mouth daily. 10/11/21   Roxan Hockey, MD  ?pantoprazole (PROTONIX) 40 MG tablet Take 1 tablet (40 mg total) by mouth 2 (two) times daily. 10/11/21   Roxan Hockey, MD  ?sucralfate (CARAFATE) 1 GM/10ML suspension Take 10 mLs (1 g total) by mouth 4 (four) times daily -  with meals and at bedtime. 10/11/21   Roxan Hockey, MD  ?thiamine 100 MG tablet Take 1 tablet (100 mg total) by mouth daily. 10/11/21   Roxan Hockey, MD  ?vitamin B-12 (CYANOCOBALAMIN) 1000 MCG tablet Take 1 tablet (1,000 mcg total) by mouth daily. 10/11/21   Roxan Hockey, MD  ?   ? ?Allergies    ?Librium [chlordiazepoxide], Toradol [ketorolac tromethamine], Butalbital-apap-caffeine, Demerol, Esgic [butalbital-apap-caffeine], Iodine, and Pyridium [phenazopyridine]   ? ?Review of Systems   ?Review of Systems  ?Constitutional:  Negative for appetite change and fatigue.  ?HENT:  Negative for congestion, ear discharge and sinus pressure.   ?Eyes:  Negative for discharge.  ?Respiratory:  Negative for cough.   ?Cardiovascular:  Negative for chest pain.  ?Gastrointestinal:  Positive for vomiting. Negative for abdominal pain and  diarrhea.  ?Genitourinary:  Negative for frequency and hematuria.  ?Musculoskeletal:  Negative for back pain.  ?Skin:  Negative for rash.  ?Neurological:  Negative for seizures and headaches.  ?Psychiatric/Behavioral:  Negative for hallucinations.   ? ?Physical Exam ?Updated Vital Signs ?BP (!) 158/90 (BP Location: Left Arm)   Pulse 90   Temp 97.9 ?F (36.6 ?C) (Oral)   Resp 20   Ht '5\' 1"'$  (1.549 m)   Wt 55.2 kg   SpO2 99%   BMI 22.99 kg/m?  ?Physical Exam ?Vitals and nursing note  reviewed.  ?Constitutional:   ?   Appearance: She is well-developed.  ?HENT:  ?   Head: Normocephalic.  ?   Nose: Nose normal.  ?Eyes:  ?   General: No scleral icterus. ?   Conjunctiva/sclera: Conjunctivae normal.  ?Neck:  ?   Thyroid: No thyromegaly.  ?Cardiovascular:  ?   Rate and Rhythm: Normal rate and regular rhythm.  ?   Heart sounds: No murmur heard. ?  No friction rub. No gallop.  ?Pulmonary:  ?   Breath sounds: No stridor. No wheezing or rales.  ?Chest:  ?   Chest wall: No tenderness.  ?Abdominal:  ?   General: There is no distension.  ?   Tenderness: There is no abdominal tenderness. There is no rebound.  ?Musculoskeletal:     ?   General: Normal range of motion.  ?   Cervical back: Neck supple.  ?Lymphadenopathy:  ?   Cervical: No cervical adenopathy.  ?Skin: ?   Findings: No erythema or rash.  ?Neurological:  ?   Mental Status: She is alert and oriented to person, place, and time.  ?   Motor: No abnormal muscle tone.  ?   Coordination: Coordination normal.  ?Psychiatric:     ?   Behavior: Behavior normal.  ?   Comments: Patient with severe agitation  ? ? ?ED Results / Procedures / Treatments   ?Labs ?(all labs ordered are listed, but only abnormal results are displayed) ?Labs Reviewed  ?CBC WITH DIFFERENTIAL/PLATELET - Abnormal; Notable for the following components:  ?    Result Value  ? WBC 16.1 (*)   ? Hemoglobin 11.2 (*)   ? HCT 33.6 (*)   ? MCV 78.3 (*)   ? RDW 18.6 (*)   ? Platelets 515 (*)   ? Neutro Abs 13.6 (*)   ? Abs Immature Granulocytes 0.08 (*)   ? All other components within normal limits  ?COMPREHENSIVE METABOLIC PANEL - Abnormal; Notable for the following components:  ? Potassium 3.4 (*)   ? CO2 20 (*)   ? Glucose, Bld 110 (*)   ? Calcium 8.2 (*)   ? Total Bilirubin 1.7 (*)   ? Anion gap 16 (*)   ? All other components within normal limits  ?COMPREHENSIVE METABOLIC PANEL - Abnormal; Notable for the following components:  ? Potassium 3.3 (*)   ? Glucose, Bld 104 (*)   ? Calcium 8.7 (*)   ?  Total Protein 6.4 (*)   ? Total Bilirubin 1.3 (*)   ? All other components within normal limits  ?CBC - Abnormal; Notable for the following components:  ? Hemoglobin 10.9 (*)   ? HCT 34.4 (*)   ? MCV 79.4 (*)   ? MCH 25.2 (*)   ? RDW 18.5 (*)   ? All other components within normal limits  ?IRON AND TIBC - Abnormal; Notable for the following components:  ? Iron 360 (*)   ?  Saturation Ratios 85 (*)   ? All other components within normal limits  ?MAGNESIUM - Abnormal; Notable for the following components:  ? Magnesium 1.6 (*)   ? All other components within normal limits  ?CBC - Abnormal; Notable for the following components:  ? Hemoglobin 11.7 (*)   ? MCH 25.9 (*)   ? RDW 18.3 (*)   ? All other components within normal limits  ?RENAL FUNCTION PANEL - Abnormal; Notable for the following components:  ? Glucose, Bld 116 (*)   ? Calcium 8.8 (*)   ? All other components within normal limits  ?LIPASE, BLOOD  ?ETHANOL  ?APTT  ?FERRITIN  ?VITAMIN B12  ?FOLATE  ? ? ?EKG ?None ? ?Radiology ?No results found. ? ?Procedures ?Procedures  ? ? ?Medications Ordered in ED ?Medications  ?sodium chloride 0.9 % bolus 1,000 mL (0 mLs Intravenous Stopped 10/09/21 1936)  ?ondansetron (ZOFRAN) injection 4 mg (4 mg Intravenous Given 10/09/21 1652)  ?LORazepam (ATIVAN) injection 0.5 mg (0.5 mg Intravenous Given 10/09/21 1652)  ?oxyCODONE-acetaminophen (PERCOCET/ROXICET) 5-325 MG per tablet 1 tablet (1 tablet Oral Given 10/09/21 1714)  ?LORazepam (ATIVAN) injection 1 mg (1 mg Intravenous Given 10/09/21 1825)  ?LORazepam (ATIVAN) injection 1 mg (1 mg Intravenous Given 10/09/21 2032)  ?potassium chloride SA (KLOR-CON M) CR tablet 40 mEq (40 mEq Oral Given 10/10/21 0331)  ?potassium chloride SA (KLOR-CON M) CR tablet 40 mEq (40 mEq Oral Given 10/10/21 1608)  ?diazepam (VALIUM) tablet 5 mg (5 mg Oral Given 10/10/21 2157)  ?magnesium sulfate IVPB 2 g 50 mL (0 g Intravenous Stopped 10/10/21 2302)  ? ? ?ED Course/ Medical Decision Making/ A&P ?  ?                         ?Medical Decision Making ?Amount and/or Complexity of Data Reviewed ?Labs: ordered. ? ?Risk ?Prescription drug management. ?Decision regarding hospitalization. ? ?Patient with vomiting from gastritis an

## 2021-10-18 ENCOUNTER — Encounter (HOSPITAL_COMMUNITY): Payer: Self-pay | Admitting: Emergency Medicine

## 2021-10-18 ENCOUNTER — Emergency Department (HOSPITAL_COMMUNITY): Payer: Medicare Other

## 2021-10-18 ENCOUNTER — Ambulatory Visit (INDEPENDENT_AMBULATORY_CARE_PROVIDER_SITE_OTHER): Payer: Medicare Other | Admitting: Family Medicine

## 2021-10-18 ENCOUNTER — Other Ambulatory Visit: Payer: Self-pay

## 2021-10-18 ENCOUNTER — Encounter: Payer: Self-pay | Admitting: Family Medicine

## 2021-10-18 ENCOUNTER — Emergency Department (HOSPITAL_COMMUNITY)
Admission: EM | Admit: 2021-10-18 | Discharge: 2021-10-18 | Disposition: A | Discharge status: Home / Self Care | Payer: Medicare Other | Attending: Emergency Medicine | Admitting: Emergency Medicine

## 2021-10-18 VITALS — BP 153/82 | HR 111 | Temp 99.7°F | Ht 61.0 in | Wt 119.8 lb

## 2021-10-18 DIAGNOSIS — Z79899 Other long term (current) drug therapy: Secondary | ICD-10-CM | POA: Diagnosis not present

## 2021-10-18 DIAGNOSIS — Y908 Blood alcohol level of 240 mg/100 ml or more: Secondary | ICD-10-CM | POA: Insufficient documentation

## 2021-10-18 DIAGNOSIS — J449 Chronic obstructive pulmonary disease, unspecified: Secondary | ICD-10-CM | POA: Diagnosis not present

## 2021-10-18 DIAGNOSIS — S0990XA Unspecified injury of head, initial encounter: Secondary | ICD-10-CM

## 2021-10-18 DIAGNOSIS — E039 Hypothyroidism, unspecified: Secondary | ICD-10-CM | POA: Diagnosis not present

## 2021-10-18 DIAGNOSIS — Z91148 Patient's other noncompliance with medication regimen for other reason: Secondary | ICD-10-CM

## 2021-10-18 DIAGNOSIS — Z20822 Contact with and (suspected) exposure to covid-19: Secondary | ICD-10-CM | POA: Diagnosis not present

## 2021-10-18 DIAGNOSIS — G894 Chronic pain syndrome: Secondary | ICD-10-CM | POA: Diagnosis not present

## 2021-10-18 DIAGNOSIS — K2920 Alcoholic gastritis without bleeding: Secondary | ICD-10-CM | POA: Diagnosis not present

## 2021-10-18 DIAGNOSIS — R4182 Altered mental status, unspecified: Secondary | ICD-10-CM | POA: Diagnosis not present

## 2021-10-18 DIAGNOSIS — Z09 Encounter for follow-up examination after completed treatment for conditions other than malignant neoplasm: Secondary | ICD-10-CM

## 2021-10-18 DIAGNOSIS — S4991XA Unspecified injury of right shoulder and upper arm, initial encounter: Secondary | ICD-10-CM | POA: Diagnosis not present

## 2021-10-18 DIAGNOSIS — W1839XA Other fall on same level, initial encounter: Secondary | ICD-10-CM | POA: Diagnosis not present

## 2021-10-18 DIAGNOSIS — I10 Essential (primary) hypertension: Secondary | ICD-10-CM | POA: Diagnosis not present

## 2021-10-18 DIAGNOSIS — F1010 Alcohol abuse, uncomplicated: Secondary | ICD-10-CM

## 2021-10-18 LAB — COMPREHENSIVE METABOLIC PANEL
ALT: 53 U/L — ABNORMAL HIGH (ref 0–44)
AST: 71 U/L — ABNORMAL HIGH (ref 15–41)
Albumin: 4.5 g/dL (ref 3.5–5.0)
Alkaline Phosphatase: 86 U/L (ref 38–126)
Anion gap: 16 — ABNORMAL HIGH (ref 5–15)
BUN: 17 mg/dL (ref 8–23)
CO2: 25 mmol/L (ref 22–32)
Calcium: 9.2 mg/dL (ref 8.9–10.3)
Chloride: 100 mmol/L (ref 98–111)
Creatinine, Ser: 0.76 mg/dL (ref 0.44–1.00)
GFR, Estimated: >60 mL/min (ref >60)
Glucose, Bld: 125 mg/dL — ABNORMAL HIGH (ref 70–99)
Potassium: 3.2 mmol/L — ABNORMAL LOW (ref 3.5–5.1)
Sodium: 141 mmol/L (ref 135–145)
Total Bilirubin: 0.8 mg/dL (ref 0.3–1.2)
Total Protein: 8 g/dL (ref 6.5–8.1)

## 2021-10-18 LAB — CBC WITH DIFFERENTIAL/PLATELET
Abs Immature Granulocytes: 0.03 10*3/uL (ref 0.00–0.07)
Basophils Absolute: 0.1 10*3/uL (ref 0.0–0.1)
Basophils Relative: 1 %
Eosinophils Absolute: 0.1 10*3/uL (ref 0.0–0.5)
Eosinophils Relative: 2 %
HCT: 39.9 % (ref 36.0–46.0)
Hemoglobin: 13.3 g/dL (ref 12.0–15.0)
Immature Granulocytes: 0 %
Lymphocytes Relative: 33 %
Lymphs Abs: 2.3 10*3/uL (ref 0.7–4.0)
MCH: 25.8 pg — ABNORMAL LOW (ref 26.0–34.0)
MCHC: 33.3 g/dL (ref 30.0–36.0)
MCV: 77.3 fL — ABNORMAL LOW (ref 80.0–100.0)
Monocytes Absolute: 0.9 10*3/uL (ref 0.1–1.0)
Monocytes Relative: 12 %
Neutro Abs: 3.6 10*3/uL (ref 1.7–7.7)
Neutrophils Relative %: 52 %
Platelets: 243 10*3/uL (ref 150–400)
RBC: 5.16 MIL/uL — ABNORMAL HIGH (ref 3.87–5.11)
RDW: 18.6 % — ABNORMAL HIGH (ref 11.5–15.5)
WBC: 6.9 10*3/uL (ref 4.0–10.5)
nRBC: 0 % (ref 0.0–0.2)

## 2021-10-18 LAB — ETHANOL: Alcohol, Ethyl (B): 313 mg/dL (ref <10)

## 2021-10-18 LAB — LIPASE, BLOOD: Lipase: 22 U/L (ref 11–51)

## 2021-10-18 MED ORDER — SODIUM CHLORIDE 0.9 % IV BOLUS
1000.0000 mL | Freq: Once | INTRAVENOUS | Status: AC
  Administered 2021-10-18: 1000 mL via INTRAVENOUS

## 2021-10-18 MED ORDER — ONDANSETRON HCL 4 MG/2ML IJ SOLN
4.0000 mg | Freq: Once | INTRAMUSCULAR | Status: AC
  Administered 2021-10-18: 4 mg via INTRAVENOUS
  Filled 2021-10-18: qty 2

## 2021-10-18 MED ORDER — LORAZEPAM 2 MG/ML IJ SOLN
0.5000 mg | Freq: Once | INTRAMUSCULAR | Status: AC
  Administered 2021-10-18: 0.5 mg via INTRAVENOUS
  Filled 2021-10-18: qty 1

## 2021-10-18 NOTE — ED Notes (Signed)
Pt ambulated self around nursing desk without issue ? ?

## 2021-10-18 NOTE — Progress Notes (Signed)
? ?Assessment & Plan:  ?1. Chronic alcohol abuse ?Discussed slow wean of alcohol with significant other. Recommended he stay at 4 drinks per day for 2-3 weeks, then decrease to 3 drinks per day for 2-3 weeks, and so forth.  Strongly recommended patient have this completed at an inpatient facility due to numerous hospitalizations for alcohol withdrawal symptoms.  A list of local rehab facilities was provided.  Referral placed to psychiatry. ? ?2. Chronic alcoholic gastritis without hemorrhage ?Encouraged patient to start taking pantoprazole and sucralfate previously prescribed. ? ?3. Hospital discharge follow-up ? ?4. Chronic pain syndrome ?Encouraged patient to resume taking Cymbalta.  Referral placed to physical therapy. ? ?5. Essential hypertension ?Encouraged patient to resume blood pressure medications. ? ?6. Noncompliance with medication regimen ?Encourage patient to resume all prescribed medications. ? ? ?Follow up plan: Return as scheduled next week with PCP. ? ?Hendricks Limes, MSN, APRN, FNP-C ?[facility] ? ?Subjective:  ? ?Patient ID: Stacy Dalton, female    DOB: February 25, 1951, 71 y.o.   MRN: [id number] ? ?HPI: ?Stacy Dalton is a 71 y.o. female presenting on 10/18/2021 for ER follow up (AP 10/18/21- fall.  Needs FL2 filled out to go back to [facility] ) ? ?Patient is accompanied by her significant other, who she is okay with being present. ? ?Patient is here for a hospital follow-up.  She was admitted to [facility] 10/09/2021-10/11/2021 due to alcohol abuse after presenting with nausea and vomiting.  Patient was treated with benzos, folic acid, and thiamine at which time her alcohol withdrawal symptoms resolved.  Patient was discharged home as she declined outpatient or inpatient alcohol rehab programs. ? ?She was seen in the ER at [facility] again this morning due to nausea, vomiting, shoulder pain, and a head injury.  She had fallen three days prior and was  starting to have extensive bruising around her eyes.  CT of the head showed no evidence of an acute bleed.  Shoulder x-ray did not show any acute fracture.  She was discharged home with advice not to abstain from alcohol without medical supervision as she is very high risk for withdrawal based on previous admissions. ? ?Patient's significant other reports today he has been trying to wean patient down on her alcohol consumption. Prior to him going out of town a few weeks ago, she was drinking 1.5 gallons of vodka per day. He reports since he has been home for a week or so he has gradually decreased her intake. He recently decreased her to four drinks per day (consisting of one shot each). They are wanting a FL2 for her to return to [place] facility.  ? ?Patient initially went to [place] facility after being detoxed in the hospital ICU. She had requested to be discharged.  ? ?Patient reports today she drinks so much to self treat her chronic back pain. States she used to have a prescription for Tramadol but her family members took her bottle from her multiple times so she just quit getting them filled and started drinking vodka. She was previously referred to pain management by her PCP in August 2022 and never went.  ? ?She has quit taking all of her medications except her amlodipine once daily. ? ? ?ROS: Negative unless specifically indicated above in HPI.  ? ?Relevant past medical history reviewed and updated as indicated.  ? ?Allergies and medications reviewed and updated. ? ? ?Current Outpatient Medications:  ?  amLODipine (NORVASC)  10 MG tablet, Take 1 tablet (10 mg total) by mouth daily., Disp: 30 tablet, Rfl: 3 ?  acetaminophen (TYLENOL) 500 MG tablet, Take by mouth. (Patient not taking: Reported on 10/18/2021), Disp: , Rfl:  ?  albuterol (VENTOLIN HFA) 108 (90 Base) MCG/ACT inhaler, Inhale 2 puffs into the lungs every 4 (four) hours as needed for wheezing or shortness of breath.  (Patient not taking: Reported on 10/18/2021), Disp: 18 g, Rfl: 2 ?  DULoxetine (CYMBALTA) 60 MG capsule, Take 1 capsule (60 mg total) by mouth daily. (Patient not taking: Reported on 10/18/2021), Disp: 90 capsule, Rfl: 2 ?  folic acid (FOLVITE) 1 MG tablet, Take 1 tablet (1 mg total) by mouth daily. (Patient not taking: Reported on 10/18/2021), Disp: 30 tablet, Rfl: 5 ?  hydrOXYzine (VISTARIL) 25 MG capsule, Take 1 capsule (25 mg total) by mouth every 4 (four) hours as needed for anxiety. (Patient not taking: Reported on 10/18/2021), Disp: 30 capsule, Rfl: 2 ?  levothyroxine (SYNTHROID) 50 MCG tablet, Take 1 tablet (50 mcg total) by mouth daily before breakfast. (Patient not taking: Reported on 10/18/2021), Disp: 90 tablet, Rfl: 2 ?  losartan (COZAAR) 50 MG tablet, Take 1 tablet (50 mg total) by mouth daily. (Patient not taking: Reported on 10/18/2021), Disp: 30 tablet, Rfl: 3 ?  magnesium oxide (MAG-OX) 400 (240 Mg) MG tablet, Take 1 tablet (400 mg total) by mouth daily. (Patient not taking: Reported on 10/18/2021), Disp: 30 tablet, Rfl: 5 ?  metoprolol tartrate (LOPRESSOR) 25 MG tablet, Take 1 tablet (25 mg total) by mouth 2 (two) times daily. (Patient not taking: Reported on 10/18/2021), Disp: 60 tablet, Rfl: 5 ?  Multiple Vitamin (MULTIVITAMIN WITH MINERALS) TABS tablet, Take 1 tablet by mouth daily. (Patient not taking: Reported on 10/18/2021), Disp: 30 tablet, Rfl: 5 ?  pantoprazole (PROTONIX) 40 MG tablet, Take 1 tablet (40 mg total) by mouth 2 (two) times daily. (Patient not taking: Reported on 10/18/2021), Disp: 60 tablet, Rfl: 1 ?  sucralfate (CARAFATE) 1 GM/10ML suspension, Take 10 mLs (1 g total) by mouth 4 (four) times daily -  with meals and at bedtime. (Patient not taking: Reported on 10/18/2021), Disp: 420 mL, Rfl: 1 ?  thiamine 100 MG tablet, Take 1 tablet (100 mg total) by mouth daily. (Patient not taking: Reported on 10/18/2021), Disp: 30 tablet, Rfl: 5 ?  vitamin B-12 (CYANOCOBALAMIN) 1000 MCG tablet, Take 1  tablet (1,000 mcg total) by mouth daily. (Patient not taking: Reported on 10/18/2021), Disp: 30 tablet, Rfl: 5 ? ?Allergies  ?Allergen Reactions  ? Librium [Chlordiazepoxide]   ?  Became unresponsive  ? Toradol [Ketorolac Tromethamine] Shortness Of Breath  ? Butalbital-Apap-Caffeine Other (See Comments)  ?  jittery  ? Demerol Swelling  ? Esgic [Butalbital-Apap-Caffeine] Other (See Comments)  ?  jittery  ? Iodine Other (See Comments)  ?  bp bottomed out per pt several hours later; unsure if pre medicated in past with cm; done in [place]  ? Limonene   ?  Other reaction(s): Other (See Comments) ?jittery  ? Meperidine Swelling  ? Pyridium [Phenazopyridine] Nausea Only  ? ? ?Objective:  ? ?BP (!) 153/82   Pulse (!) 111   Temp 99.7 ?F (37.6 ?C) (Temporal)   Ht '5\' 1"'$  (1.549 m)   Wt 119 lb 12.8 oz (54.3 kg)   SpO2 97%   BMI 22.64 kg/m?   ? ?Physical Exam ?Vitals reviewed.  ?Constitutional:   ?   General: She is not in acute distress. ?  Appearance: Normal appearance. She is not ill-appearing, toxic-appearing or diaphoretic.  ?HENT:  ?   Head: Normocephalic and atraumatic.  ?Eyes:  ?   General: No scleral icterus.    ?   Right eye: No discharge.     ?   Left eye: No discharge.  ?   Conjunctiva/sclera: Conjunctivae normal.  ?   Comments: Bruising around eyes (R>L).  ?Cardiovascular:  ?   Rate and Rhythm: Normal rate and regular rhythm.  ?   Heart sounds: Normal heart sounds. No murmur heard. ?  No friction rub. No gallop.  ?Pulmonary:  ?   Effort: Pulmonary effort is normal. No respiratory distress.  ?   Breath sounds: Normal breath sounds. No stridor. No wheezing, rhonchi or rales.  ?Musculoskeletal:     ?   General: Normal range of motion.  ?   Cervical back: Normal range of motion.  ?Skin: ?   General: Skin is warm and dry.  ?   Capillary Refill: Capillary refill takes less than 2 seconds.  ?Neurological:  ?   General: No focal deficit present.  ?   Mental Status: She is alert and oriented to person, place, and time.  Mental status is at baseline.  ?Psychiatric:     ?   Mood and Affect: Mood normal.     ?   Behavior: Behavior normal.     ?   Thought Content: Thought content normal.     ?   Judgment: Judgment normal.  ? ? ? ? ?

## 2021-10-18 NOTE — ED Triage Notes (Signed)
Pt fell Saturday and since has been vomiting and not acting herself. Pt had 2 shots of vodka today. ?

## 2021-10-18 NOTE — Discharge Instructions (Addendum)
You were seen today for abdominal pain and vomiting.  This is likely all related to your alcohol abuse.  This is also likely contributing to your falls at home.  Likely you did not sustain any significant injury.  If you choose to abstain from alcohol, you will need medical detox. ?

## 2021-10-18 NOTE — ED Notes (Signed)
Pt transported to Ct.

## 2021-10-18 NOTE — ED Notes (Signed)
Pt po fluid challenge was passed with flying colors as she drank an entire cup of ice water without vomiting  ?

## 2021-10-18 NOTE — ED Provider Notes (Signed)
?Stacy ?Provider Note ? ? ?CSN: [id number] ?Arrival date & time: 10/18/21  0034 ? ?  ? ?History ? ?Chief Complaint  ?Patient presents with  ? Abdominal Pain  ?   ?  ? ? ?Stacy Dalton is a 71 y.o. female. ? ?HPI ? ?  ? ?This is a 71 year old female with a history of alcohol abuse who presents with nausea, vomiting, head injury.  Per patient's family, he found her on the floor on Saturday after noon after she had fallen.  He noted blood over her face and then on Sunday noted darkening and bruising around the eyes.  Patient has a history of extensive alcohol use.  He has been weaning her home and states that she last took 2 shots today.  Patient has had multiple episodes of nonbilious, nonbloody emesis since the fall.  When asked, the patient states "I am sick."  She will not elaborate.  She denies significant abdominal pain.  She is not on any blood thinners. ? ?Level 5 caveat ? ?Home Medications ?Prior to Admission medications   ?Medication Sig Start Date End Date Taking? Authorizing Provider  ?acetaminophen (TYLENOL) 500 MG tablet Take by mouth. 07/13/21   [provider]  ?albuterol (VENTOLIN HFA) 108 (90 Base) MCG/ACT inhaler Inhale 2 puffs into the lungs every 4 (four) hours as needed for wheezing or shortness of breath. 10/11/21   Roxan Hockey, MD  ?amLODipine (NORVASC) 10 MG tablet Take 1 tablet (10 mg total) by mouth daily. 10/11/21   Roxan Hockey, MD  ?DULoxetine (CYMBALTA) 60 MG capsule Take 1 capsule (60 mg total) by mouth daily. 10/11/21   Roxan Hockey, MD  ?folic acid (FOLVITE) 1 MG tablet Take 1 tablet (1 mg total) by mouth daily. 10/11/21   Roxan Hockey, MD  ?hydrOXYzine (VISTARIL) 25 MG capsule Take 1 capsule (25 mg total) by mouth every 4 (four) hours as needed for anxiety. 10/11/21   Roxan Hockey, MD  ?levothyroxine (SYNTHROID) 50 MCG tablet Take 1 tablet (50 mcg total) by mouth daily before breakfast. 10/11/21   Roxan Hockey, MD  ?losartan  (COZAAR) 50 MG tablet Take 1 tablet (50 mg total) by mouth daily. 10/11/21   Roxan Hockey, MD  ?magnesium oxide (MAG-OX) 400 (240 Mg) MG tablet Take 1 tablet (400 mg total) by mouth daily. 10/11/21   Roxan Hockey, MD  ?metoprolol tartrate (LOPRESSOR) 25 MG tablet Take 1 tablet (25 mg total) by mouth 2 (two) times daily. 10/11/21   Roxan Hockey, MD  ?Multiple Vitamin (MULTIVITAMIN WITH MINERALS) TABS tablet Take 1 tablet by mouth daily. 10/11/21   Roxan Hockey, MD  ?pantoprazole (PROTONIX) 40 MG tablet Take 1 tablet (40 mg total) by mouth 2 (two) times daily. 10/11/21   Roxan Hockey, MD  ?sucralfate (CARAFATE) 1 GM/10ML suspension Take 10 mLs (1 g total) by mouth 4 (four) times daily -  with meals and at bedtime. 10/11/21   Roxan Hockey, MD  ?thiamine 100 MG tablet Take 1 tablet (100 mg total) by mouth daily. 10/11/21   Roxan Hockey, MD  ?vitamin B-12 (CYANOCOBALAMIN) 1000 MCG tablet Take 1 tablet (1,000 mcg total) by mouth daily. 10/11/21   Roxan Hockey, MD  ?   ? ?Allergies    ?Librium [chlordiazepoxide], Toradol [ketorolac tromethamine], Butalbital-apap-caffeine, Demerol, Esgic [butalbital-apap-caffeine], Iodine, and Pyridium [phenazopyridine]   ? ?Review of Systems   ?Review of Systems  ?Constitutional:  Negative for fever.  ?Respiratory:  Negative for shortness of breath.   ?Cardiovascular:  Negative  for chest pain.  ?Gastrointestinal:  Positive for nausea and vomiting. Negative for abdominal pain.  ?All other systems reviewed and are negative. ? ?Physical Exam ?Updated Vital Signs ?BP (!) 144/84 (BP Location: Left Arm)   Pulse 98   Temp 97.9 ?F (36.6 ?C) (Oral)   Resp (!) 22   Ht 1.549 m ('5\' 1"'$ )   Wt 55.2 kg   SpO2 95%   BMI 22.99 kg/m?  ?Physical Exam ?Vitals and nursing note reviewed.  ?Constitutional:   ?   Appearance: She is ill-appearing.  ?   Comments: Elderly, frail and chronically ill-appearing  ?HENT:  ?   Head: Normocephalic.  ?   Comments: Ecchymosis bilateral eyes and  nasal bridge ?   Mouth/Throat:  ?   Mouth: Mucous membranes are dry.  ?Eyes:  ?   Pupils: Pupils are equal, round, and reactive to light.  ?Cardiovascular:  ?   Rate and Rhythm: Normal rate and regular rhythm.  ?Pulmonary:  ?   Effort: Pulmonary effort is normal. No respiratory distress.  ?   Breath sounds: No wheezing.  ?Abdominal:  ?   Palpations: Abdomen is soft.  ?Musculoskeletal:  ?   Cervical back: Neck supple.  ?Skin: ?   General: Skin is warm and dry.  ?Neurological:  ?   Mental Status: She is alert and oriented to person, place, and time.  ?Psychiatric:  ?   Comments: Labile but cooperative  ? ? ?ED Results / Procedures / Treatments   ?Labs ?(all labs ordered are listed, but only abnormal results are displayed) ?Labs Reviewed  ?CBC WITH DIFFERENTIAL/PLATELET - Abnormal; Notable for the following components:  ?    Result Value  ? RBC 5.16 (*)   ? MCV 77.3 (*)   ? MCH 25.8 (*)   ? RDW 18.6 (*)   ? All other components within normal limits  ?COMPREHENSIVE METABOLIC PANEL - Abnormal; Notable for the following components:  ? Potassium 3.2 (*)   ? Glucose, Bld 125 (*)   ? AST 71 (*)   ? ALT 53 (*)   ? Anion gap 16 (*)   ? All other components within normal limits  ?ETHANOL - Abnormal; Notable for the following components:  ? Alcohol, Ethyl (B) 313 (*)   ? All other components within normal limits  ?LIPASE, BLOOD  ? ? ?EKG ?EKG Interpretation ? ?Date/Time:  Tuesday October 18 2021 01:25:02 EDT ?Ventricular Rate:  85 ?PR Interval:  134 ?QRS Duration: 76 ?QT Interval:  378 ?QTC Calculation: 449 ?R Axis:   93 ?Text Interpretation: Normal sinus rhythm Rightward axis Borderline ECG When compared with ECG of 10-Sep-2021 11:28, PREVIOUS ECG IS PRESENT Confirmed by Thayer Jew [phone]) on 10/18/2021 2:51:10 AM ? ?Radiology ?DG Shoulder Right ? ?Result Date: 10/18/2021 ?CLINICAL DATA:  Fall and trauma to the right shoulder. EXAM: RIGHT SHOULDER - 2+ VIEW COMPARISON:  None. FINDINGS: No acute fracture or dislocation. The  bones are osteopenic. There is mild elevation of the right humeral head suggestive of chronic rotator cuff injury. Old healed right 4th-6th rib fractures. The soft tissues are unremarkable IMPRESSION: 1. No acute fracture or dislocation. 2. Osteopenia. Electronically Signed   By: Anner Crete M.D.   On: 10/18/2021 02:12  ? ?CT Head Wo Contrast ? ?Result Date: 10/18/2021 ?CLINICAL DATA:  Head trauma, moderate-severe. Fall, altered mental status. EXAM: CT HEAD WITHOUT CONTRAST TECHNIQUE: Contiguous axial images were obtained from the base of the skull through the vertex without intravenous contrast. RADIATION DOSE  REDUCTION: This exam was performed according to the departmental dose-optimization program which includes automated exposure control, adjustment of the mA and/or kV according to patient size and/or use of iterative reconstruction technique. COMPARISON:  09/17/2021 FINDINGS: Brain: Normal anatomic configuration. No abnormal intra or extra-axial mass lesion or fluid collection. No abnormal mass effect or midline shift. No evidence of acute intracranial hemorrhage or infarct. Ventricular size is normal. Cerebellum unremarkable. Vascular: Unremarkable Skull: Intact Sinuses/Orbits: Paranasal sinuses are clear. Orbits are unremarkable. Other: There is fluid opacification of the left mastoid air cells a small amount of fluid is seen within the left middle ear cavity. Fluid opacifies several right mastoid air cells. The right middle ear cavity is clear. Mild soft tissue swelling anterior to the supraorbital ridge. IMPRESSION: No acute intracranial abnormality.  No calvarial fracture. Bilateral mastoid effusions. Fluid within the left middle ear cavity. Correlation for signs and symptoms of otomastoiditis may be helpful. Mild frontal scalp soft tissue swelling. Electronically Signed   By: Fidela Salisbury M.D.   On: 10/18/2021 02:06   ? ?Procedures ?Procedures  ? ? ?Medications Ordered in ED ?Medications  ?sodium  chloride 0.9 % bolus 1,000 mL (0 mLs Intravenous Stopped 10/18/21 0541)  ?ondansetron [facility]) injection 4 mg (4 mg Intravenous Given 10/18/21 0149)  ?LORazepam (ATIVAN) injection 0.5 mg (0.5 mg Intravenous Given 4/18

## 2021-10-18 NOTE — ED Notes (Signed)
Date and time results received: 10/18/21 0219 ?(use smartphrase ".now" to insert current time) ? ?Test: ETOH ?Critical Value: 0319 ? ?Name of Provider Notified: C. Horton, MD ? ?

## 2021-10-21 DIAGNOSIS — Z20828 Contact with and (suspected) exposure to other viral communicable diseases: Secondary | ICD-10-CM | POA: Diagnosis not present

## 2021-10-24 ENCOUNTER — Ambulatory Visit: Payer: Medicare Other | Admitting: Family

## 2021-11-03 ENCOUNTER — Encounter: Payer: Self-pay | Admitting: Family

## 2021-11-03 DIAGNOSIS — Z20822 Contact with and (suspected) exposure to covid-19: Secondary | ICD-10-CM | POA: Diagnosis not present

## 2021-11-09 ENCOUNTER — Telehealth: Payer: Self-pay | Admitting: Family

## 2021-11-09 NOTE — Telephone Encounter (Signed)
Merrie Roof called from [employer] to let PCP know that pt was recently discharged from [facility] and has issues with alcohol. Her significant other is trying to get pt into a facility for alcoholism so Merrie Roof is going to add Education officer, museum to the PT order we sent them so that Education officer, museum can check on pt. ? ?Merideth Abbey verbal ok. He will send order.  ?

## 2021-11-15 ENCOUNTER — Ambulatory Visit: Payer: Medicare Other | Admitting: Family Medicine

## 2021-11-15 ENCOUNTER — Encounter: Payer: Self-pay | Admitting: Family

## 2021-11-22 ENCOUNTER — Telehealth: Payer: Self-pay | Admitting: *Deleted

## 2021-11-22 NOTE — Telephone Encounter (Signed)
Returned call to [employer] w/ [facility] from this morning RE: POC & SOC orders needing to be signed. Pt was dismissed by PCP Evelina Dun on 11/15/21 for [place]. In discussion with Clinical Lead Roselyn Reef, informed Crystal that for 30 days until 12/16/21 Evelina Dun will signed orders and POC until pt finds another PCP.

## 2021-12-17 ENCOUNTER — Emergency Department (HOSPITAL_COMMUNITY): Payer: Medicare Other

## 2021-12-17 ENCOUNTER — Emergency Department (HOSPITAL_COMMUNITY)
Admission: EM | Admit: 2021-12-17 | Discharge: 2021-12-18 | Disposition: A | Discharge status: Home / Self Care | Payer: Medicare Other | Attending: Emergency Medicine | Admitting: Emergency Medicine

## 2021-12-17 ENCOUNTER — Encounter (HOSPITAL_COMMUNITY): Payer: Self-pay

## 2021-12-17 ENCOUNTER — Other Ambulatory Visit: Payer: Self-pay

## 2021-12-17 DIAGNOSIS — R079 Chest pain, unspecified: Secondary | ICD-10-CM | POA: Diagnosis not present

## 2021-12-17 DIAGNOSIS — R0789 Other chest pain: Secondary | ICD-10-CM | POA: Diagnosis not present

## 2021-12-17 DIAGNOSIS — E876 Hypokalemia: Secondary | ICD-10-CM | POA: Insufficient documentation

## 2021-12-17 DIAGNOSIS — F4321 Adjustment disorder with depressed mood: Secondary | ICD-10-CM | POA: Diagnosis not present

## 2021-12-17 DIAGNOSIS — R0602 Shortness of breath: Secondary | ICD-10-CM | POA: Diagnosis not present

## 2021-12-17 LAB — TROPONIN I (HIGH SENSITIVITY)
Troponin I (High Sensitivity): 9 ng/L (ref <18)
Troponin I (High Sensitivity): 9 ng/L (ref <18)

## 2021-12-17 LAB — BASIC METABOLIC PANEL
Anion gap: 8 (ref 5–15)
BUN: 12 mg/dL (ref 8–23)
CO2: 29 mmol/L (ref 22–32)
Calcium: 8.3 mg/dL — ABNORMAL LOW (ref 8.9–10.3)
Chloride: 98 mmol/L (ref 98–111)
Creatinine, Ser: 0.71 mg/dL (ref 0.44–1.00)
GFR, Estimated: >60 mL/min (ref >60)
Glucose, Bld: 109 mg/dL — ABNORMAL HIGH (ref 70–99)
Potassium: 2.5 mmol/L — CL (ref 3.5–5.1)
Sodium: 135 mmol/L (ref 135–145)

## 2021-12-17 LAB — MAGNESIUM: Magnesium: 1.3 mg/dL — ABNORMAL LOW (ref 1.7–2.4)

## 2021-12-17 LAB — CBC
HCT: 28.7 % — ABNORMAL LOW (ref 36.0–46.0)
Hemoglobin: 9.6 g/dL — ABNORMAL LOW (ref 12.0–15.0)
MCH: 28.2 pg (ref 26.0–34.0)
MCHC: 33.4 g/dL (ref 30.0–36.0)
MCV: 84.4 fL (ref 80.0–100.0)
Platelets: 156 10*3/uL (ref 150–400)
RBC: 3.4 MIL/uL — ABNORMAL LOW (ref 3.87–5.11)
RDW: 21.2 % — ABNORMAL HIGH (ref 11.5–15.5)
WBC: 5.5 10*3/uL (ref 4.0–10.5)
nRBC: 0 % (ref 0.0–0.2)

## 2021-12-17 MED ORDER — METOCLOPRAMIDE HCL 5 MG/ML IJ SOLN
10.0000 mg | Freq: Once | INTRAMUSCULAR | Status: AC
  Administered 2021-12-17: 10 mg via INTRAVENOUS
  Filled 2021-12-17: qty 2

## 2021-12-17 MED ORDER — SODIUM CHLORIDE 0.9 % IV BOLUS
500.0000 mL | Freq: Once | INTRAVENOUS | Status: AC
  Administered 2021-12-17: 500 mL via INTRAVENOUS

## 2021-12-17 NOTE — ED Triage Notes (Signed)
EMS called out by pts children due to pt having chest pain, SHOB, intoxication. EMS states she has been drinking due to depression after recent loss. Pt hypotensive during transport. Pt slightly agitated at this time, endorses anxiety.

## 2021-12-17 NOTE — ED Notes (Signed)
Date and time results received: 12/17/21 2036  (use smartphrase ".now" to insert current time)  Test: Potassium Critical Value: 2.5  Name of Provider Notified: zammit  Orders Received? Or Actions Taken?: none yet

## 2021-12-17 NOTE — ED Provider Notes (Signed)
Stacy Dalton Provider Note   CSN: [id number] Arrival date & time: 12/17/21  1819     History  Chief Complaint  Patient presents with   Chest Pain    Stacy Dalton is a 71 y.o. female.  Patient presents to the emergency department by ambulance from home.  Patient's family brought EMS.  Patient reportedly has been complaining about chest pain and shortness of breath.  Upon arrival, she says "I am sick".  When asked to elaborate she reports "sick to the stomach".       Home Medications Prior to Admission medications   Medication Sig Start Date End Date Taking? Authorizing Provider  potassium chloride SA (KLOR-CON M) 20 MEQ tablet Take 1 tablet (20 mEq total) by mouth daily. 12/18/21  Yes Lakeisha Waldrop, Gwenyth Allegra, MD  albuterol (VENTOLIN HFA) 108 (90 Base) MCG/ACT inhaler Inhale 2 puffs into the lungs every 4 (four) hours as needed for wheezing or shortness of breath. Patient not taking: Reported on 10/18/2021 10/11/21   Roxan Hockey, MD  amLODipine (NORVASC) 10 MG tablet Take 1 tablet (10 mg total) by mouth daily. 10/11/21   Roxan Hockey, MD  DULoxetine (CYMBALTA) 60 MG capsule Take 1 capsule (60 mg total) by mouth daily. Patient not taking: Reported on 10/18/2021 10/11/21   Roxan Hockey, MD  folic acid (FOLVITE) 1 MG tablet Take 1 tablet (1 mg total) by mouth daily. Patient not taking: Reported on 10/18/2021 10/11/21   Roxan Hockey, MD  hydrOXYzine (VISTARIL) 25 MG capsule Take 1 capsule (25 mg total) by mouth every 4 (four) hours as needed for anxiety. Patient not taking: Reported on 10/18/2021 10/11/21   Roxan Hockey, MD  levothyroxine (SYNTHROID) 50 MCG tablet Take 1 tablet (50 mcg total) by mouth daily before breakfast. Patient not taking: Reported on 10/18/2021 10/11/21   Roxan Hockey, MD  losartan (COZAAR) 50 MG tablet Take 1 tablet (50 mg total) by mouth daily. Patient not taking: Reported on 10/18/2021 10/11/21   Roxan Hockey, MD   magnesium oxide (MAG-OX) 400 (240 Mg) MG tablet Take 1 tablet (400 mg total) by mouth daily. Patient not taking: Reported on 10/18/2021 10/11/21   Roxan Hockey, MD  metoprolol tartrate (LOPRESSOR) 25 MG tablet Take 1 tablet (25 mg total) by mouth 2 (two) times daily. Patient not taking: Reported on 10/18/2021 10/11/21   Roxan Hockey, MD  Multiple Vitamin (MULTIVITAMIN WITH MINERALS) TABS tablet Take 1 tablet by mouth daily. Patient not taking: Reported on 10/18/2021 10/11/21   Roxan Hockey, MD  pantoprazole (PROTONIX) 40 MG tablet Take 1 tablet (40 mg total) by mouth 2 (two) times daily. Patient not taking: Reported on 10/18/2021 10/11/21   Roxan Hockey, MD  sucralfate (CARAFATE) 1 GM/10ML suspension Take 10 mLs (1 g total) by mouth 4 (four) times daily -  with meals and at bedtime. Patient not taking: Reported on 10/18/2021 10/11/21   Roxan Hockey, MD  thiamine 100 MG tablet Take 1 tablet (100 mg total) by mouth daily. Patient not taking: Reported on 10/18/2021 10/11/21   Roxan Hockey, MD  vitamin B-12 (CYANOCOBALAMIN) 1000 MCG tablet Take 1 tablet (1,000 mcg total) by mouth daily. Patient not taking: Reported on 10/18/2021 10/11/21   Roxan Hockey, MD      Allergies    Librium [chlordiazepoxide], Toradol [ketorolac tromethamine], Butalbital-apap-caffeine, Demerol, Esgic [butalbital-apap-caffeine], Iodine, Limonene, Meperidine, and Pyridium [phenazopyridine]    Review of Systems   Review of Systems  Physical Exam Updated Vital Signs BP 130/69   Pulse  74   Temp 97.9 F (36.6 C) (Oral)   Resp 17   Ht '5\' 2"'$  (1.575 m)   Wt 56.7 kg   SpO2 100%   BMI 22.86 kg/m  Physical Exam Vitals and nursing note reviewed.  Constitutional:      General: She is not in acute distress.    Appearance: She is well-developed.  HENT:     Head: Normocephalic and atraumatic.     Mouth/Throat:     Mouth: Mucous membranes are moist.  Eyes:     General: Vision grossly intact. Gaze aligned  appropriately.     Extraocular Movements: Extraocular movements intact.     Conjunctiva/sclera: Conjunctivae normal.  Cardiovascular:     Rate and Rhythm: Normal rate and regular rhythm.     Pulses: Normal pulses.     Heart sounds: Normal heart sounds, S1 normal and S2 normal. No murmur heard.    No friction rub. No gallop.  Pulmonary:     Effort: Pulmonary effort is normal. No respiratory distress.     Breath sounds: Normal breath sounds.  Abdominal:     General: Bowel sounds are normal.     Palpations: Abdomen is soft.     Tenderness: There is no abdominal tenderness. There is no guarding or rebound.     Hernia: No hernia is present.  Musculoskeletal:        General: No swelling.     Cervical back: Full passive range of motion without pain, normal range of motion and neck supple. No spinous process tenderness or muscular tenderness. Normal range of motion.     Right lower leg: No edema.     Left lower leg: No edema.  Skin:    General: Skin is warm and dry.     Capillary Refill: Capillary refill takes less than 2 seconds.     Findings: No ecchymosis, erythema, rash or wound.  Neurological:     General: No focal deficit present.     Mental Status: She is alert and oriented to person, place, and time.     GCS: GCS eye subscore is 4. GCS verbal subscore is 5. GCS motor subscore is 6.     Cranial Nerves: Cranial nerves 2-12 are intact.     Sensory: Sensation is intact.     Motor: Motor function is intact.     Coordination: Coordination is intact.  Psychiatric:        Attention and Perception: Attention normal.        Mood and Affect: Mood normal.        Speech: Speech normal.        Behavior: Behavior normal.     ED Results / Procedures / Treatments   Labs (all labs ordered are listed, but only abnormal results are displayed) Labs Reviewed  BASIC METABOLIC PANEL - Abnormal; Notable for the following components:      Result Value   Potassium 2.5 (*)    Glucose, Bld 109 (*)     Calcium 8.3 (*)    All other components within normal limits  CBC - Abnormal; Notable for the following components:   RBC 3.40 (*)    Hemoglobin 9.6 (*)    HCT 28.7 (*)    RDW 21.2 (*)    All other components within normal limits  MAGNESIUM - Abnormal; Notable for the following components:   Magnesium 1.3 (*)    All other components within normal limits  BRAIN NATRIURETIC PEPTIDE  TROPONIN I (HIGH SENSITIVITY)  TROPONIN I (HIGH SENSITIVITY)    EKG EKG Interpretation  Date/Time:  Saturday December 17 2021 18:34:49 EDT Ventricular Rate:  77 PR Interval:  138 QRS Duration: 89 QT Interval:  395 QTC Calculation: 447 R Axis:   50 Text Interpretation: Sinus rhythm Confirmed by Orpah Greek [phone]) on 12/17/2021 11:17:03 PM  Radiology DG Chest 2 View  Result Date: 12/17/2021 CLINICAL DATA:  Chest pain EXAM: CHEST - 2 VIEW COMPARISON:  09/22/2021 FINDINGS: The heart size and mediastinal contours are within normal limits. Aortic atherosclerosis. No focal airspace consolidation, pleural effusion, or pneumothorax. The visualized skeletal structures are unremarkable. IMPRESSION: No active cardiopulmonary disease. Electronically Signed   By: Davina Poke D.O.   On: 12/17/2021 20:11    Procedures Procedures    Medications Ordered in ED Medications  potassium chloride 10 mEq in 100 mL IVPB (10 mEq Intravenous New Bag/Given 12/18/21 0635)  potassium chloride SA (KLOR-CON M) CR tablet 40 mEq (has no administration in time range)  sodium chloride 0.9 % bolus 500 mL (0 mLs Intravenous Stopped 12/18/21 0326)  metoCLOPramide (REGLAN) injection 10 mg (10 mg Intravenous Given 12/17/21 2333)  magnesium sulfate IVPB 2 g 50 mL (0 g Intravenous Stopped 12/18/21 0441)  LORazepam (ATIVAN) tablet 0.5 mg (0.5 mg Oral Given 12/18/21 0604)  acetaminophen (TYLENOL) tablet 650 mg (650 mg Oral Given 12/18/21 3491)    ED Course/ Medical Decision Making/ A&P                           Medical  Decision Making Amount and/or Complexity of Data Reviewed Labs: ordered. Radiology: ordered.  Risk OTC drugs. Prescription drug management.   Presents to the emergency department with multiple symptoms.  Patient reports being depressed since one of her friends recently died.  She was drinking yesterday to deal with her depression.  She is not homicidal or suicidal.  Patient complained of chest pain initially to EMS but not here in the ED.  Her cardiac evaluation is unremarkable.  Patient found to be hypokalemic.  This has been a problem for her in the past.  Magnesium was low as well.  Both were replaced here in the ED, patient will be discharged with continued potassium replacement.  She will need to follow-up with counselor as an outpatient, given resources.        Final Clinical Impression(s) / ED Diagnoses Final diagnoses:  Grief  Hypokalemia    Rx / DC Orders ED Discharge Orders          Ordered    potassium chloride SA (KLOR-CON M) 20 MEQ tablet  Daily        12/18/21 0702              Orpah Greek, MD 12/18/21 [phone]

## 2021-12-18 DIAGNOSIS — R079 Chest pain, unspecified: Secondary | ICD-10-CM | POA: Diagnosis not present

## 2021-12-18 LAB — BRAIN NATRIURETIC PEPTIDE: B Natriuretic Peptide: 63 pg/mL (ref 0.0–100.0)

## 2021-12-18 MED ORDER — POTASSIUM CHLORIDE CRYS ER 20 MEQ PO TBCR
40.0000 meq | EXTENDED_RELEASE_TABLET | Freq: Once | ORAL | Status: AC
  Administered 2021-12-18: 40 meq via ORAL
  Filled 2021-12-18: qty 2

## 2021-12-18 MED ORDER — ACETAMINOPHEN 325 MG PO TABS
650.0000 mg | ORAL_TABLET | Freq: Once | ORAL | Status: AC
  Administered 2021-12-18: 650 mg via ORAL
  Filled 2021-12-18: qty 2

## 2021-12-18 MED ORDER — POTASSIUM CHLORIDE 10 MEQ/100ML IV SOLN
10.0000 meq | INTRAVENOUS | Status: AC
  Administered 2021-12-18 (×5): 10 meq via INTRAVENOUS
  Filled 2021-12-18 (×5): qty 100

## 2021-12-18 MED ORDER — POTASSIUM CHLORIDE CRYS ER 20 MEQ PO TBCR: 20.0000 meq | EXTENDED_RELEASE_TABLET | Freq: Every day | ORAL | 0 refills | Status: DC

## 2021-12-18 MED ORDER — LORAZEPAM 0.5 MG PO TABS
0.5000 mg | ORAL_TABLET | Freq: Once | ORAL | Status: AC
  Administered 2021-12-18: 0.5 mg via ORAL
  Filled 2021-12-18: qty 1

## 2021-12-18 MED ORDER — MAGNESIUM SULFATE 2 GM/50ML IV SOLN
2.0000 g | Freq: Once | INTRAVENOUS | Status: AC
  Administered 2021-12-18: 2 g via INTRAVENOUS
  Filled 2021-12-18: qty 50

## 2021-12-18 NOTE — ED Notes (Signed)
4 more runs of K+

## 2022-03-02 ENCOUNTER — Ambulatory Visit: Payer: Medicare Other

## 2022-03-18 ENCOUNTER — Other Ambulatory Visit: Payer: Self-pay

## 2022-03-18 ENCOUNTER — Emergency Department (HOSPITAL_COMMUNITY): Payer: Medicare Other

## 2022-03-18 ENCOUNTER — Emergency Department (HOSPITAL_COMMUNITY)
Admission: EM | Admit: 2022-03-18 | Discharge: 2022-03-18 | Disposition: A | Discharge status: Home / Self Care | Payer: Medicare Other | Attending: Emergency Medicine | Admitting: Emergency Medicine

## 2022-03-18 ENCOUNTER — Encounter (HOSPITAL_COMMUNITY): Payer: Self-pay | Admitting: *Deleted

## 2022-03-18 DIAGNOSIS — N3001 Acute cystitis with hematuria: Secondary | ICD-10-CM | POA: Diagnosis not present

## 2022-03-18 DIAGNOSIS — I10 Essential (primary) hypertension: Secondary | ICD-10-CM | POA: Insufficient documentation

## 2022-03-18 DIAGNOSIS — D649 Anemia, unspecified: Secondary | ICD-10-CM | POA: Diagnosis not present

## 2022-03-18 DIAGNOSIS — R1013 Epigastric pain: Secondary | ICD-10-CM | POA: Diagnosis not present

## 2022-03-18 DIAGNOSIS — M545 Low back pain, unspecified: Secondary | ICD-10-CM | POA: Insufficient documentation

## 2022-03-18 DIAGNOSIS — G8929 Other chronic pain: Secondary | ICD-10-CM | POA: Diagnosis not present

## 2022-03-18 DIAGNOSIS — R109 Unspecified abdominal pain: Secondary | ICD-10-CM | POA: Diagnosis not present

## 2022-03-18 DIAGNOSIS — Y908 Blood alcohol level of 240 mg/100 ml or more: Secondary | ICD-10-CM | POA: Diagnosis not present

## 2022-03-18 DIAGNOSIS — F1010 Alcohol abuse, uncomplicated: Secondary | ICD-10-CM | POA: Insufficient documentation

## 2022-03-18 LAB — COMPREHENSIVE METABOLIC PANEL
ALT: 71 U/L — ABNORMAL HIGH (ref 0–44)
AST: 97 U/L — ABNORMAL HIGH (ref 15–41)
Albumin: 3.7 g/dL (ref 3.5–5.0)
Alkaline Phosphatase: 89 U/L (ref 38–126)
Anion gap: 14 (ref 5–15)
BUN: 13 mg/dL (ref 8–23)
CO2: 19 mmol/L — ABNORMAL LOW (ref 22–32)
Calcium: 8.1 mg/dL — ABNORMAL LOW (ref 8.9–10.3)
Chloride: 107 mmol/L (ref 98–111)
Creatinine, Ser: 0.86 mg/dL (ref 0.44–1.00)
GFR, Estimated: >60 mL/min (ref >60)
Glucose, Bld: 101 mg/dL — ABNORMAL HIGH (ref 70–99)
Potassium: 3.6 mmol/L (ref 3.5–5.1)
Sodium: 140 mmol/L (ref 135–145)
Total Bilirubin: 0.8 mg/dL (ref 0.3–1.2)
Total Protein: 6.5 g/dL (ref 6.5–8.1)

## 2022-03-18 LAB — CBC WITH DIFFERENTIAL/PLATELET
Abs Immature Granulocytes: 0.02 10*3/uL (ref 0.00–0.07)
Basophils Absolute: 0.1 10*3/uL (ref 0.0–0.1)
Basophils Relative: 1 %
Eosinophils Absolute: 0.3 10*3/uL (ref 0.0–0.5)
Eosinophils Relative: 5 %
HCT: 35.2 % — ABNORMAL LOW (ref 36.0–46.0)
Hemoglobin: 11.4 g/dL — ABNORMAL LOW (ref 12.0–15.0)
Immature Granulocytes: 0 %
Lymphocytes Relative: 42 %
Lymphs Abs: 2.1 10*3/uL (ref 0.7–4.0)
MCH: 25.5 pg — ABNORMAL LOW (ref 26.0–34.0)
MCHC: 32.4 g/dL (ref 30.0–36.0)
MCV: 78.7 fL — ABNORMAL LOW (ref 80.0–100.0)
Monocytes Absolute: 0.6 10*3/uL (ref 0.1–1.0)
Monocytes Relative: 12 %
Neutro Abs: 2 10*3/uL (ref 1.7–7.7)
Neutrophils Relative %: 40 %
Platelets: 314 10*3/uL (ref 150–400)
RBC: 4.47 MIL/uL (ref 3.87–5.11)
RDW: 16.5 % — ABNORMAL HIGH (ref 11.5–15.5)
WBC: 5 10*3/uL (ref 4.0–10.5)
nRBC: 0 % (ref 0.0–0.2)

## 2022-03-18 LAB — URINALYSIS, ROUTINE W REFLEX MICROSCOPIC
Bacteria, UA: NONE SEEN
Bilirubin Urine: NEGATIVE
Glucose, UA: NEGATIVE mg/dL
Ketones, ur: NEGATIVE mg/dL
Nitrite: NEGATIVE
Protein, ur: NEGATIVE mg/dL
Specific Gravity, Urine: 1.012 (ref 1.005–1.030)
WBC, UA: >50 WBC/hpf — ABNORMAL HIGH (ref 0–5)
pH: 7 (ref 5.0–8.0)

## 2022-03-18 LAB — ETHANOL: Alcohol, Ethyl (B): 277 mg/dL — ABNORMAL HIGH (ref <10)

## 2022-03-18 LAB — LIPASE, BLOOD: Lipase: 27 U/L (ref 11–51)

## 2022-03-18 LAB — TROPONIN I (HIGH SENSITIVITY): Troponin I (High Sensitivity): 9 ng/L (ref <18)

## 2022-03-18 MED ORDER — DICYCLOMINE HCL 10 MG PO CAPS
20.0000 mg | ORAL_CAPSULE | Freq: Once | ORAL | Status: AC
Start: 2022-03-18 — End: 2022-03-18
  Administered 2022-03-18: 20 mg via ORAL
  Filled 2022-03-18: qty 2

## 2022-03-18 MED ORDER — SODIUM CHLORIDE 0.9 % IV BOLUS
1000.0000 mL | Freq: Once | INTRAVENOUS | Status: AC
  Administered 2022-03-18: 1000 mL via INTRAVENOUS

## 2022-03-18 MED ORDER — DICYCLOMINE HCL 20 MG PO TABS: 20.0000 mg | ORAL_TABLET | Freq: Every day | ORAL | 0 refills | Status: DC | PRN

## 2022-03-18 MED ORDER — CYCLOBENZAPRINE HCL 10 MG PO TABS: 10.0000 mg | ORAL_TABLET | Freq: Every day | ORAL | 0 refills | Status: DC | PRN

## 2022-03-18 MED ORDER — ONDANSETRON HCL 4 MG/2ML IJ SOLN
4.0000 mg | Freq: Once | INTRAMUSCULAR | Status: AC
  Administered 2022-03-18: 4 mg via INTRAVENOUS
  Filled 2022-03-18: qty 2

## 2022-03-18 MED ORDER — CEPHALEXIN 500 MG PO CAPS: 500.0000 mg | ORAL_CAPSULE | Freq: Two times a day (BID) | ORAL | 0 refills | Status: DC

## 2022-03-18 MED ORDER — CYCLOBENZAPRINE HCL 10 MG PO TABS
10.0000 mg | ORAL_TABLET | Freq: Once | ORAL | Status: AC
Start: 2022-03-18 — End: 2022-03-18
  Administered 2022-03-18: 10 mg via ORAL
  Filled 2022-03-18: qty 1

## 2022-03-18 MED ORDER — ONDANSETRON HCL 4 MG PO TABS: 4.0000 mg | ORAL_TABLET | Freq: Four times a day (QID) | ORAL | 0 refills | Status: DC

## 2022-03-18 MED ORDER — FAMOTIDINE 20 MG PO TABS
20.0000 mg | ORAL_TABLET | Freq: Once | ORAL | Status: AC
Start: 2022-03-18 — End: 2022-03-18
  Administered 2022-03-18: 20 mg via ORAL
  Filled 2022-03-18: qty 1

## 2022-03-18 MED ORDER — FAMOTIDINE 20 MG PO TABS: 20.0000 mg | ORAL_TABLET | Freq: Two times a day (BID) | ORAL | 0 refills | Status: DC

## 2022-03-18 NOTE — Discharge Instructions (Addendum)
Stomach pain-likely this is gastritis from alcohol use, I would like you do discontinue alcohol as this is worsening her symptoms, I have given you Bentyl please use as needed for stomach spasms, I have also given you Pepcid please take for stomach acids.  I would like to follow-up with GI for reassessment. Back pain-like this is a muscular strain I am can start you on a muscle laxer take as prescribed, this medication can make you sleepy do not take with alcohol, do not operate heavy UTI-start you on antibiotics please take as prescribed, follow-up with the [facility] for reassessment.  Come back to the emergency department if you develop chest pain, shortness of breath, severe abdominal pain, uncontrolled nausea, vomiting, diarrhea.

## 2022-03-18 NOTE — ED Triage Notes (Signed)
Pt reports to ed with a "tummy hurt." Lbm 03/18/2022. Pt reports vomiting on 03/18/2022. Pt reports hx of pna and copd

## 2022-03-18 NOTE — ED Provider Notes (Signed)
[facility] EMERGENCY DEPARTMENT Provider Note   CSN: [id number] Arrival date & time: 03/18/22  1311     History  Chief Complaint  Patient presents with   Abdominal Pain    Stacy Dalton is a 71 y.o. female.  HPI   Patient with medical history including hypertension GERD, small bowel obstruction, status post colostomy, history of alcohol dependency presents with  complaints of epigastric pain.  Patient states epigastric pain has been going on for about a week's time, pain does not radiate, remains constant, associated nausea and vomiting denies coffee-ground emesis or hematochezia, denies any constipation diarrhea, last bowel movement was today, no melena hematochezia, she denies any urinary symptoms, denies any chest pain or shortness of breath.  Patient states that she has had this pain in the past and feels unchanged from prior, she also notes that she has been drinking alcohol, she states that she is a daily drinker, drinks about a third of a bottle of liquor daily, last drink was earlier this morning.   Husband is at bedside he is able to validate the story, states that patient finished drinking this morning, there is been no recent head trauma, pain is at her normal baseline.  Reviewed patient's chart she has been seen the past for alcohol intoxication as well as epigastric tenderness, most recent was a month ago, she had benign work-up and was later discharged, she has been hospitalized in past for alcohol withdrawals, she has had an endoscopy performed 6 months ago which reveals gastritis.  Home Medications Prior to Admission medications   Medication Sig Start Date End Date Taking? Authorizing Provider  cephALEXin (KEFLEX) 500 MG capsule Take 1 capsule (500 mg total) by mouth 2 (two) times daily for 7 days. 03/18/22 03/25/22 Yes Marcello Fennel, PA-C  cyclobenzaprine (FLEXERIL) 10 MG tablet Take 1 tablet (10 mg total) by mouth daily as needed for muscle spasms. 03/18/22  Yes  Marcello Fennel, PA-C  dicyclomine (BENTYL) 20 MG tablet Take 1 tablet (20 mg total) by mouth daily as needed for spasms. 03/18/22  Yes Marcello Fennel, PA-C  famotidine (PEPCID) 20 MG tablet Take 1 tablet (20 mg total) by mouth 2 (two) times daily. 03/18/22  Yes Marcello Fennel, PA-C  ondansetron (ZOFRAN) 4 MG tablet Take 1 tablet (4 mg total) by mouth every 6 (six) hours. 03/18/22  Yes Marcello Fennel, PA-C  albuterol (VENTOLIN HFA) 108 (90 Base) MCG/ACT inhaler Inhale 2 puffs into the lungs every 4 (four) hours as needed for wheezing or shortness of breath. Patient not taking: Reported on 10/18/2021 10/11/21   Roxan Hockey, MD  amLODipine (NORVASC) 10 MG tablet Take 1 tablet (10 mg total) by mouth daily. 10/11/21   Roxan Hockey, MD  DULoxetine (CYMBALTA) 60 MG capsule Take 1 capsule (60 mg total) by mouth daily. Patient not taking: Reported on 10/18/2021 10/11/21   Roxan Hockey, MD  folic acid (FOLVITE) 1 MG tablet Take 1 tablet (1 mg total) by mouth daily. Patient not taking: Reported on 10/18/2021 10/11/21   Roxan Hockey, MD  hydrOXYzine (VISTARIL) 25 MG capsule Take 1 capsule (25 mg total) by mouth every 4 (four) hours as needed for anxiety. Patient not taking: Reported on 10/18/2021 10/11/21   Roxan Hockey, MD  levothyroxine (SYNTHROID) 50 MCG tablet Take 1 tablet (50 mcg total) by mouth daily before breakfast. Patient not taking: Reported on 10/18/2021 10/11/21   Roxan Hockey, MD  losartan (COZAAR) 50 MG tablet Take 1 tablet (50  mg total) by mouth daily. Patient not taking: Reported on 10/18/2021 10/11/21   Roxan Hockey, MD  magnesium oxide (MAG-OX) 400 (240 Mg) MG tablet Take 1 tablet (400 mg total) by mouth daily. Patient not taking: Reported on 10/18/2021 10/11/21   Roxan Hockey, MD  metoprolol tartrate (LOPRESSOR) 25 MG tablet Take 1 tablet (25 mg total) by mouth 2 (two) times daily. Patient not taking: Reported on 10/18/2021 10/11/21   Roxan Hockey, MD   Multiple Vitamin (MULTIVITAMIN WITH MINERALS) TABS tablet Take 1 tablet by mouth daily. Patient not taking: Reported on 10/18/2021 10/11/21   Roxan Hockey, MD  pantoprazole (PROTONIX) 40 MG tablet Take 1 tablet (40 mg total) by mouth 2 (two) times daily. Patient not taking: Reported on 10/18/2021 10/11/21   Roxan Hockey, MD  potassium chloride SA (KLOR-CON M) 20 MEQ tablet Take 1 tablet (20 mEq total) by mouth daily. 12/18/21   Orpah Greek, MD  sucralfate (CARAFATE) 1 GM/10ML suspension Take 10 mLs (1 g total) by mouth 4 (four) times daily -  with meals and at bedtime. Patient not taking: Reported on 10/18/2021 10/11/21   Roxan Hockey, MD  thiamine 100 MG tablet Take 1 tablet (100 mg total) by mouth daily. Patient not taking: Reported on 10/18/2021 10/11/21   Roxan Hockey, MD  vitamin B-12 (CYANOCOBALAMIN) 1000 MCG tablet Take 1 tablet (1,000 mcg total) by mouth daily. Patient not taking: Reported on 10/18/2021 10/11/21   Roxan Hockey, MD      Allergies    Librium [chlordiazepoxide], Toradol [ketorolac tromethamine], Butalbital-apap-caffeine, Demerol, Esgic [butalbital-apap-caffeine], Iodine, Limonene, Meperidine, and Pyridium [phenazopyridine]    Review of Systems   Review of Systems  Constitutional:  Negative for chills and fever.  Respiratory:  Negative for shortness of breath.   Cardiovascular:  Negative for chest pain.  Gastrointestinal:  Positive for abdominal pain, nausea and vomiting. Negative for diarrhea.  Neurological:  Negative for headaches.    Physical Exam Updated Vital Signs BP 133/73   Pulse 85   Temp 97.9 F (36.6 C) (Oral)   Resp 13   SpO2 95%  Physical Exam Vitals and nursing note reviewed.  Constitutional:      General: She is not in acute distress.    Appearance: She is not ill-appearing.  HENT:     Head: Normocephalic and atraumatic.     Comments: No deformity of the head present no raccoon eyes or battle sign noted.    Nose: No  congestion.     Mouth/Throat:     Mouth: Mucous membranes are moist.     Pharynx: Oropharynx is clear.     Comments: No trismus no torticollis no oral trauma Eyes:     Conjunctiva/sclera: Conjunctivae normal.  Cardiovascular:     Rate and Rhythm: Normal rate and regular rhythm.     Pulses: Normal pulses.     Heart sounds: No murmur heard.    No friction rub. No gallop.  Pulmonary:     Effort: No respiratory distress.     Breath sounds: No wheezing, rhonchi or rales.  Abdominal:     Palpations: Abdomen is soft.     Tenderness: There is abdominal tenderness. There is no right CVA tenderness or left CVA tenderness.     Comments: Abdomen nondistended, soft, she had noted tenderness in her epigastric as well as her left lower quadrant, without guarding rebound tenderness or peritoneal sign negative Murphy sign McBurney point.  Skin:    General: Skin is warm and dry.  Neurological:     Mental Status: She is alert.     Comments: Appears to be slightly intoxicated on my exam but is responding appropriately to all questions, not endorsing any suicidal homicidal ideations.  Psychiatric:        Mood and Affect: Mood normal.     ED Results / Procedures / Treatments   Labs (all labs ordered are listed, but only abnormal results are displayed) Labs Reviewed  COMPREHENSIVE METABOLIC PANEL - Abnormal; Notable for the following components:      Result Value   CO2 19 (*)    Glucose, Bld 101 (*)    Calcium 8.1 (*)    AST 97 (*)    ALT 71 (*)    All other components within normal limits  CBC WITH DIFFERENTIAL/PLATELET - Abnormal; Notable for the following components:   Hemoglobin 11.4 (*)    HCT 35.2 (*)    MCV 78.7 (*)    MCH 25.5 (*)    RDW 16.5 (*)    All other components within normal limits  URINALYSIS, ROUTINE W REFLEX MICROSCOPIC - Abnormal; Notable for the following components:   APPearance HAZY (*)    Hgb urine dipstick SMALL (*)    Leukocytes,Ua LARGE (*)    WBC, UA >50 (*)     All other components within normal limits  ETHANOL - Abnormal; Notable for the following components:   Alcohol, Ethyl (B) 277 (*)    All other components within normal limits  URINE CULTURE  LIPASE, BLOOD  TROPONIN I (HIGH SENSITIVITY)    EKG None  Radiology DG Abdomen Acute W/Chest  Result Date: 03/18/2022 CLINICAL DATA:  Abdominal pain. EXAM: DG ABDOMEN ACUTE WITH 1 VIEW CHEST COMPARISON:  Chest x-ray dated December 17, 2021. Abdominal x-ray dated June 22, 2021. FINDINGS: There is no evidence of dilated bowel loops or free intraperitoneal air. No radiopaque calculi or other significant radiographic abnormality is seen. Heart size and mediastinal contours are within normal limits. Both lungs are clear. IMPRESSION: Negative abdominal radiographs.  No acute cardiopulmonary disease. Electronically Signed   By: Titus Dubin M.D.   On: 03/18/2022 16:05    Procedures Procedures    Medications Ordered in ED Medications  cyclobenzaprine (FLEXERIL) tablet 10 mg (has no administration in time range)  sodium chloride 0.9 % bolus 1,000 mL (0 mLs Intravenous Stopped 03/18/22 1635)  famotidine (PEPCID) tablet 20 mg (20 mg Oral Given 03/18/22 1732)  ondansetron (ZOFRAN) injection 4 mg (4 mg Intravenous Given 03/18/22 1542)  dicyclomine (BENTYL) capsule 20 mg (20 mg Oral Given 03/18/22 1734)    ED Course/ Medical Decision Making/ A&P                           Medical Decision Making Amount and/or Complexity of Data Reviewed Labs: ordered. Radiology: ordered.  Risk Prescription drug management.   This patient presents to the ED for concern of abdominal pain, this involves an extensive number of treatment options, and is a complaint that carries with it a high risk of complications and morbidity.  The differential diagnosis includes gastritis, ACS, cholecystitis, stomach ulcer, pneumonia    Additional history obtained:  Additional history obtained from husband at bedside External  records from outside source obtained and reviewed including PCP notes   Co morbidities that complicate the patient evaluation  Gastritis  Social Determinants of Health:  Alcohol dependency    Lab Tests:  I Ordered, and personally interpreted labs.  The pertinent results include: CBC shows normocytic anemia hemoglobin 11.4, CMP shows CO2 of 19 glucose 101 calcium 8.1 AST 97 ALT 71 ethanol 277 lipase 27 for troponin is 9, UA reveals leukocytes red blood cells white blood cells.   Imaging Studies ordered:  I ordered imaging studies including acute chest abdomen pelvis I independently visualized and interpreted imaging which showed negative acute findings I agree with the radiologist interpretation   Cardiac Monitoring:  The patient was maintained on a cardiac monitor.  I personally viewed and interpreted the cardiac monitored which showed an underlying rhythm of: Without signs of ischemia   Medicines ordered and prescription drug management:  I ordered medication including Pepcid Bentyl for stomach pain, Zofran for nausea fluids for hydration I have reviewed the patients home medicines and have made adjustments as needed  Critical Interventions:  N/A   Reevaluation:  With epigastric pain seems most consistent with gastritis secondary due to alcohol consumption, but due to her age cannot fully exclude atypical ACS will obtain basic lab work-up, prior with fluids pain medication and reassess  Patient has been observed in the ER for 5 hours, she has metabolized back to her baseline, she is tolerating p.o., on my reassessment she is endorsing any stomach pain at this time, she is requesting that she may go home, I mentioned that her UA was slightly abnormal, she does note that she is having some urinary frequency, will start her on antibiotics for possible UTI.  She also notes that she is having some right lower back pain, states is chronic for her, on my exam she was tender with  a muscular strain to her right iliac crest, spine is nontender to palpation no overlying skin changes, pain is focalized reproducible, worsened with leg movement, no red flag symptoms, will start her on Flexeril.  She is agreement this plan.    Consultations Obtained:  N/A   Test Considered:  CTAP-deferred as my suspicion for intra-abdominal abnormality is low at this time, nonsurgical abdomen, symptoms have resolved after Pepcid and Bentyl, she is nontoxic-appearing, vital signs are reassuring.    Rule out I have low suspicion for withdrawals as she is nontremulous on my exam, vital signs are reassuring.  I have low suspicion for psychiatric emergency no endorse suicidal homicidal ideations does not appear to be responding to internal stimuli.low suspicion for lower lobe pneumonia as lung sounds are clear bilaterally, imaging is negative.  I have low suspicion for liver or gallbladder abnormality as she has no right upper quadrant tenderness, alk phos, T bili all within normal limits.  It is noted that liver enzymes are slightly elevated but upon further review from the appear to be chronically elevated, this is likely secondary due to alcohol use, will have her follow-up with her PCP for reassessment.  Low suspicion for pancreatitis as lipase is within normal limits.  Low suspicion for ruptured stomach ulcer as she has no peritoneal sign present on exam.  Low suspicion for bowel obstruction as abdomen is nondistended normal bowel sounds, so passing gas and having normal bowel movements abdominal series were negative for bowel obstruction.  Low suspicion for complicated diverticulitis as she is nontoxic-appearing, vital signs reassuring no leukocytosis present.  Low suspicion for appendicitis as she has no right lower quadrant tenderness, vital signs reassuring.  I have low suspicion for intra-abdominal infection as she has low risk factors, vital signs reassuring, no leukocytosis, will defer  imaging at this time.     Dispostion and  problem list  After consideration of the diagnostic results and the patients response to treatment, I feel that the patent would benefit from discharge.  UTI-we will start her on Keflex, follow-up with health department for further evaluation. Back pain-we will provide her with muscle relaxer, follow-up with orthopedics for reassessment. Abdominal pain-likely gastritis from alcohol consumption, will provide with Bentyl and Pepcid follow-up with GI for further assessment, counseled on decreasing alcohol consumption.            Final Clinical Impression(s) / ED Diagnoses Final diagnoses:  Epigastric pain  Acute cystitis with hematuria  Chronic right-sided low back pain without sciatica    Rx / DC Orders ED Discharge Orders          Ordered    ondansetron (ZOFRAN) 4 MG tablet  Every 6 hours        03/18/22 1820    famotidine (PEPCID) 20 MG tablet  2 times daily        03/18/22 1820    dicyclomine (BENTYL) 20 MG tablet  Daily PRN        03/18/22 1820    cyclobenzaprine (FLEXERIL) 10 MG tablet  Daily PRN        03/18/22 1820    cephALEXin (KEFLEX) 500 MG capsule  2 times daily        03/18/22 1823              Marcello Fennel, PA-C 03/18/22 1824    Jeanell Sparrow, DO 03/19/22 1609

## 2022-03-18 NOTE — ED Triage Notes (Signed)
Pt brought in by [facility] for c/o abdominal pain and back pain; ETOH on board; pt screaming out "I am sick, I need help"

## 2022-03-20 LAB — URINE CULTURE

## 2022-03-22 ENCOUNTER — Other Ambulatory Visit: Payer: Self-pay | Admitting: *Deleted

## 2022-03-22 NOTE — Patient Outreach (Signed)
  Care Coordination   03/22/2022  Name: ALESHKA CORNEY MRN: [id number] DOB: 21-Jun-1951   Care Coordination Outreach Attempts:  An unsuccessful telephone outreach was attempted today to offer the patient information about available care coordination services as a benefit of their health plan. HIPAA compliant message left on voicemail, providing contact information for CSW, encouraging patient to return CSW's call at her earliest convenience.  Follow Up Plan:  Additional outreach attempts will be made to offer the patient care coordination information and services.   Encounter Outcome:  No Answer.   Care Coordination Interventions Activated:  No.    Care Coordination Interventions:  No, not indicated.    Nat Christen, BSW, MSW, LCSW  Licensed Education officer, [facility]  Mailing [place] [address], [place], [place] [address] Physical Address-[address], [place], [place] [address] Toll Free Main # [phone] Fax # [phone] Cell # [phone] [email or web address]

## 2022-03-24 ENCOUNTER — Emergency Department (HOSPITAL_COMMUNITY): Payer: Medicare Other

## 2022-03-24 ENCOUNTER — Other Ambulatory Visit: Payer: Self-pay

## 2022-03-24 ENCOUNTER — Inpatient Hospital Stay (HOSPITAL_COMMUNITY)
Admission: EM | Admit: 2022-03-24 | Discharge: 2022-03-28 | DRG: 897 | Disposition: A | Discharge status: Home Health | Payer: Medicare Other | Attending: Family Medicine | Admitting: Family Medicine

## 2022-03-24 ENCOUNTER — Encounter (HOSPITAL_COMMUNITY): Payer: Self-pay

## 2022-03-24 DIAGNOSIS — I160 Hypertensive urgency: Secondary | ICD-10-CM | POA: Diagnosis present

## 2022-03-24 DIAGNOSIS — G8929 Other chronic pain: Secondary | ICD-10-CM | POA: Diagnosis present

## 2022-03-24 DIAGNOSIS — Z8249 Family history of ischemic heart disease and other diseases of the circulatory system: Secondary | ICD-10-CM | POA: Diagnosis not present

## 2022-03-24 DIAGNOSIS — M47816 Spondylosis without myelopathy or radiculopathy, lumbar region: Secondary | ICD-10-CM | POA: Diagnosis not present

## 2022-03-24 DIAGNOSIS — F10939 Alcohol use, unspecified with withdrawal, unspecified: Secondary | ICD-10-CM | POA: Diagnosis present

## 2022-03-24 DIAGNOSIS — E039 Hypothyroidism, unspecified: Secondary | ICD-10-CM | POA: Diagnosis present

## 2022-03-24 DIAGNOSIS — Z888 Allergy status to other drugs, medicaments and biological substances status: Secondary | ICD-10-CM

## 2022-03-24 DIAGNOSIS — K219 Gastro-esophageal reflux disease without esophagitis: Secondary | ICD-10-CM | POA: Diagnosis present

## 2022-03-24 DIAGNOSIS — Z8349 Family history of other endocrine, nutritional and metabolic diseases: Secondary | ICD-10-CM

## 2022-03-24 DIAGNOSIS — M1612 Unilateral primary osteoarthritis, left hip: Secondary | ICD-10-CM | POA: Diagnosis not present

## 2022-03-24 DIAGNOSIS — M1712 Unilateral primary osteoarthritis, left knee: Secondary | ICD-10-CM | POA: Diagnosis not present

## 2022-03-24 DIAGNOSIS — I10 Essential (primary) hypertension: Secondary | ICD-10-CM | POA: Diagnosis not present

## 2022-03-24 DIAGNOSIS — J449 Chronic obstructive pulmonary disease, unspecified: Secondary | ICD-10-CM | POA: Diagnosis not present

## 2022-03-24 DIAGNOSIS — F10231 Alcohol dependence with withdrawal delirium: Principal | ICD-10-CM | POA: Diagnosis present

## 2022-03-24 DIAGNOSIS — R4182 Altered mental status, unspecified: Secondary | ICD-10-CM | POA: Diagnosis not present

## 2022-03-24 DIAGNOSIS — E876 Hypokalemia: Secondary | ICD-10-CM | POA: Diagnosis present

## 2022-03-24 DIAGNOSIS — M8580 Other specified disorders of bone density and structure, unspecified site: Secondary | ICD-10-CM | POA: Diagnosis present

## 2022-03-24 DIAGNOSIS — R52 Pain, unspecified: Secondary | ICD-10-CM | POA: Diagnosis not present

## 2022-03-24 DIAGNOSIS — E038 Other specified hypothyroidism: Secondary | ICD-10-CM | POA: Diagnosis not present

## 2022-03-24 DIAGNOSIS — Z825 Family history of asthma and other chronic lower respiratory diseases: Secondary | ICD-10-CM | POA: Diagnosis not present

## 2022-03-24 DIAGNOSIS — F1020 Alcohol dependence, uncomplicated: Principal | ICD-10-CM

## 2022-03-24 DIAGNOSIS — Z90710 Acquired absence of both cervix and uterus: Secondary | ICD-10-CM

## 2022-03-24 DIAGNOSIS — F10931 Alcohol use, unspecified with withdrawal delirium: Secondary | ICD-10-CM | POA: Diagnosis not present

## 2022-03-24 DIAGNOSIS — Z833 Family history of diabetes mellitus: Secondary | ICD-10-CM

## 2022-03-24 DIAGNOSIS — M549 Dorsalgia, unspecified: Secondary | ICD-10-CM | POA: Diagnosis present

## 2022-03-24 DIAGNOSIS — Z79899 Other long term (current) drug therapy: Secondary | ICD-10-CM

## 2022-03-24 DIAGNOSIS — F10930 Alcohol use, unspecified with withdrawal, uncomplicated: Secondary | ICD-10-CM

## 2022-03-24 DIAGNOSIS — R531 Weakness: Secondary | ICD-10-CM | POA: Diagnosis not present

## 2022-03-24 DIAGNOSIS — F419 Anxiety disorder, unspecified: Secondary | ICD-10-CM | POA: Diagnosis present

## 2022-03-24 DIAGNOSIS — M48061 Spinal stenosis, lumbar region without neurogenic claudication: Secondary | ICD-10-CM | POA: Diagnosis not present

## 2022-03-24 DIAGNOSIS — Z800 Family history of malignant neoplasm of digestive organs: Secondary | ICD-10-CM

## 2022-03-24 DIAGNOSIS — Z79890 Hormone replacement therapy (postmenopausal): Secondary | ICD-10-CM | POA: Diagnosis not present

## 2022-03-24 DIAGNOSIS — Z823 Family history of stroke: Secondary | ICD-10-CM

## 2022-03-24 DIAGNOSIS — F32A Depression, unspecified: Secondary | ICD-10-CM | POA: Diagnosis present

## 2022-03-24 DIAGNOSIS — Z87891 Personal history of nicotine dependence: Secondary | ICD-10-CM | POA: Diagnosis not present

## 2022-03-24 DIAGNOSIS — W19XXXA Unspecified fall, initial encounter: Secondary | ICD-10-CM | POA: Diagnosis not present

## 2022-03-24 DIAGNOSIS — R000 Tachycardia, unspecified: Secondary | ICD-10-CM | POA: Diagnosis not present

## 2022-03-24 DIAGNOSIS — F321 Major depressive disorder, single episode, moderate: Secondary | ICD-10-CM

## 2022-03-24 LAB — URINALYSIS, ROUTINE W REFLEX MICROSCOPIC
Bilirubin Urine: NEGATIVE
Glucose, UA: NEGATIVE mg/dL
Hgb urine dipstick: NEGATIVE
Ketones, ur: 5 mg/dL — AB
Nitrite: NEGATIVE
Protein, ur: 30 mg/dL — AB
Specific Gravity, Urine: 1.013 (ref 1.005–1.030)
pH: 5 (ref 5.0–8.0)

## 2022-03-24 LAB — CBC
HCT: 34.8 % — ABNORMAL LOW (ref 36.0–46.0)
Hemoglobin: 11.2 g/dL — ABNORMAL LOW (ref 12.0–15.0)
MCH: 25.5 pg — ABNORMAL LOW (ref 26.0–34.0)
MCHC: 32.2 g/dL (ref 30.0–36.0)
MCV: 79.1 fL — ABNORMAL LOW (ref 80.0–100.0)
Platelets: 207 10*3/uL (ref 150–400)
RBC: 4.4 MIL/uL (ref 3.87–5.11)
RDW: 17.6 % — ABNORMAL HIGH (ref 11.5–15.5)
WBC: 3.9 10*3/uL — ABNORMAL LOW (ref 4.0–10.5)
nRBC: 0 % (ref 0.0–0.2)

## 2022-03-24 LAB — BASIC METABOLIC PANEL
Anion gap: 17 — ABNORMAL HIGH (ref 5–15)
BUN: 15 mg/dL (ref 8–23)
CO2: 22 mmol/L (ref 22–32)
Calcium: 8.9 mg/dL (ref 8.9–10.3)
Chloride: 102 mmol/L (ref 98–111)
Creatinine, Ser: 0.74 mg/dL (ref 0.44–1.00)
GFR, Estimated: >60 mL/min (ref >60)
Glucose, Bld: 105 mg/dL — ABNORMAL HIGH (ref 70–99)
Potassium: 3.1 mmol/L — ABNORMAL LOW (ref 3.5–5.1)
Sodium: 141 mmol/L (ref 135–145)

## 2022-03-24 LAB — MAGNESIUM: Magnesium: 1.4 mg/dL — ABNORMAL LOW (ref 1.7–2.4)

## 2022-03-24 LAB — RAPID URINE DRUG SCREEN, HOSP PERFORMED
Amphetamines: NOT DETECTED
Barbiturates: NOT DETECTED
Benzodiazepines: NOT DETECTED
Cocaine: NOT DETECTED
Opiates: NOT DETECTED
Tetrahydrocannabinol: NOT DETECTED

## 2022-03-24 LAB — ETHANOL: Alcohol, Ethyl (B): 124 mg/dL — ABNORMAL HIGH (ref <10)

## 2022-03-24 LAB — TSH: TSH: 3.056 u[IU]/mL (ref 0.350–4.500)

## 2022-03-24 MED ORDER — MAGNESIUM SULFATE 2 GM/50ML IV SOLN
2.0000 g | Freq: Once | INTRAVENOUS | Status: AC
  Administered 2022-03-24: 2 g via INTRAVENOUS
  Filled 2022-03-24: qty 50

## 2022-03-24 MED ORDER — LORAZEPAM 1 MG PO TABS
1.0000 mg | ORAL_TABLET | ORAL | Status: AC | PRN
  Administered 2022-03-25: 3 mg via ORAL
  Filled 2022-03-24: qty 3

## 2022-03-24 MED ORDER — THIAMINE HCL 100 MG/ML IJ SOLN
100.0000 mg | Freq: Every day | INTRAMUSCULAR | Status: DC
  Administered 2022-03-24: 100 mg via INTRAVENOUS
  Filled 2022-03-24: qty 2

## 2022-03-24 MED ORDER — THIAMINE HCL 100 MG PO TABS: 100.0000 mg | ORAL_TABLET | Freq: Every day | ORAL | Status: DC

## 2022-03-24 MED ORDER — LORAZEPAM 1 MG PO TABS
0.0000 mg | ORAL_TABLET | Freq: Two times a day (BID) | ORAL | Status: AC
  Administered 2022-03-26 – 2022-03-28 (×4): 1 mg via ORAL
  Filled 2022-03-24 (×4): qty 1

## 2022-03-24 MED ORDER — LORAZEPAM 2 MG/ML IJ SOLN
0.0000 mg | Freq: Four times a day (QID) | INTRAMUSCULAR | Status: DC
  Administered 2022-03-24: 2 mg via INTRAVENOUS
  Filled 2022-03-24: qty 1

## 2022-03-24 MED ORDER — LORAZEPAM 2 MG/ML IJ SOLN
2.0000 mg | Freq: Once | INTRAMUSCULAR | Status: AC
  Administered 2022-03-24: 2 mg via INTRAVENOUS
  Filled 2022-03-24: qty 1

## 2022-03-24 MED ORDER — AMLODIPINE BESYLATE 5 MG PO TABS: 10.0000 mg | ORAL_TABLET | Freq: Every day | ORAL | Status: DC

## 2022-03-24 MED ORDER — LOSARTAN POTASSIUM 25 MG PO TABS: 50.0000 mg | ORAL_TABLET | Freq: Every day | ORAL | Status: DC

## 2022-03-24 MED ORDER — LOSARTAN POTASSIUM 50 MG PO TABS
50.0000 mg | ORAL_TABLET | Freq: Every day | ORAL | Status: DC
  Administered 2022-03-24 – 2022-03-28 (×5): 50 mg via ORAL
  Filled 2022-03-24 (×3): qty 1
  Filled 2022-03-24: qty 2
  Filled 2022-03-24: qty 1

## 2022-03-24 MED ORDER — THIAMINE HCL 100 MG/ML IJ SOLN
100.0000 mg | Freq: Every day | INTRAMUSCULAR | Status: DC
  Administered 2022-03-25: 100 mg via INTRAVENOUS
  Filled 2022-03-24 (×2): qty 2

## 2022-03-24 MED ORDER — THIAMINE MONONITRATE 100 MG PO TABS
100.0000 mg | ORAL_TABLET | Freq: Every day | ORAL | Status: DC
  Administered 2022-03-25 – 2022-03-28 (×4): 100 mg via ORAL
  Filled 2022-03-24 (×4): qty 1

## 2022-03-24 MED ORDER — LEVOTHYROXINE SODIUM 50 MCG PO TABS
50.0000 ug | ORAL_TABLET | Freq: Every day | ORAL | Status: DC
  Administered 2022-03-25 – 2022-03-28 (×4): 50 ug via ORAL
  Filled 2022-03-24 (×4): qty 1

## 2022-03-24 MED ORDER — ONDANSETRON HCL 4 MG/2ML IJ SOLN
4.0000 mg | Freq: Four times a day (QID) | INTRAMUSCULAR | Status: DC | PRN
  Administered 2022-03-24 – 2022-03-27 (×4): 4 mg via INTRAVENOUS
  Filled 2022-03-24 (×4): qty 2

## 2022-03-24 MED ORDER — MORPHINE SULFATE (PF) 4 MG/ML IV SOLN
4.0000 mg | Freq: Once | INTRAVENOUS | Status: AC
  Administered 2022-03-24: 4 mg via INTRAVENOUS
  Filled 2022-03-24: qty 1

## 2022-03-24 MED ORDER — THIAMINE MONONITRATE 100 MG PO TABS: 100.0000 mg | ORAL_TABLET | Freq: Every day | ORAL | Status: DC

## 2022-03-24 MED ORDER — LORAZEPAM 2 MG/ML IJ SOLN
1.0000 mg | INTRAMUSCULAR | Status: AC | PRN
  Administered 2022-03-24 (×2): 2 mg via INTRAVENOUS
  Filled 2022-03-24 (×2): qty 1

## 2022-03-24 MED ORDER — METOPROLOL TARTRATE 5 MG/5ML IV SOLN
10.0000 mg | Freq: Once | INTRAVENOUS | Status: AC
  Administered 2022-03-24: 10 mg via INTRAVENOUS
  Filled 2022-03-24: qty 10

## 2022-03-24 MED ORDER — ENOXAPARIN SODIUM 40 MG/0.4ML IJ SOSY
40.0000 mg | PREFILLED_SYRINGE | INTRAMUSCULAR | Status: DC
  Administered 2022-03-25 – 2022-03-27 (×3): 40 mg via SUBCUTANEOUS
  Filled 2022-03-24 (×3): qty 0.4

## 2022-03-24 MED ORDER — FENTANYL CITRATE PF 50 MCG/ML IJ SOSY
50.0000 ug | PREFILLED_SYRINGE | Freq: Once | INTRAMUSCULAR | Status: AC
  Administered 2022-03-24: 50 ug via INTRAVENOUS
  Filled 2022-03-24: qty 1

## 2022-03-24 MED ORDER — ONDANSETRON HCL 4 MG/2ML IJ SOLN
4.0000 mg | Freq: Once | INTRAMUSCULAR | Status: AC
  Administered 2022-03-24: 4 mg via INTRAVENOUS
  Filled 2022-03-24: qty 2

## 2022-03-24 MED ORDER — METOPROLOL TARTRATE 25 MG PO TABS
25.0000 mg | ORAL_TABLET | Freq: Once | ORAL | Status: AC
  Administered 2022-03-24: 25 mg via ORAL
  Filled 2022-03-24: qty 1

## 2022-03-24 MED ORDER — LORAZEPAM 1 MG PO TABS
0.0000 mg | ORAL_TABLET | Freq: Four times a day (QID) | ORAL | Status: AC
  Administered 2022-03-24: 2 mg via ORAL
  Administered 2022-03-25 (×2): 3 mg via ORAL
  Administered 2022-03-25: 1 mg via ORAL
  Administered 2022-03-25: 3 mg via ORAL
  Administered 2022-03-26 (×2): 1 mg via ORAL
  Filled 2022-03-24: qty 2
  Filled 2022-03-24: qty 1
  Filled 2022-03-24 (×2): qty 3
  Filled 2022-03-24 (×2): qty 1
  Filled 2022-03-24: qty 3

## 2022-03-24 MED ORDER — POTASSIUM CHLORIDE CRYS ER 20 MEQ PO TBCR
40.0000 meq | EXTENDED_RELEASE_TABLET | Freq: Once | ORAL | Status: AC
  Administered 2022-03-24: 40 meq via ORAL
  Filled 2022-03-24: qty 2

## 2022-03-24 MED ORDER — LORAZEPAM 1 MG PO TABS: 0.0000 mg | ORAL_TABLET | Freq: Two times a day (BID) | ORAL | Status: DC

## 2022-03-24 MED ORDER — ONDANSETRON HCL 4 MG PO TABS
4.0000 mg | ORAL_TABLET | Freq: Four times a day (QID) | ORAL | Status: DC | PRN
  Administered 2022-03-24 – 2022-03-25 (×2): 4 mg via ORAL
  Filled 2022-03-24 (×2): qty 1

## 2022-03-24 MED ORDER — LORAZEPAM 2 MG/ML IJ SOLN: 0.0000 mg | Freq: Two times a day (BID) | INTRAMUSCULAR | Status: DC

## 2022-03-24 MED ORDER — AMLODIPINE BESYLATE 5 MG PO TABS
10.0000 mg | ORAL_TABLET | Freq: Every day | ORAL | Status: DC
  Administered 2022-03-25 – 2022-03-27 (×3): 10 mg via ORAL
  Filled 2022-03-24 (×4): qty 2

## 2022-03-24 MED ORDER — ADULT MULTIVITAMIN W/MINERALS CH
1.0000 | ORAL_TABLET | Freq: Every day | ORAL | Status: DC
  Administered 2022-03-24 – 2022-03-28 (×5): 1 via ORAL
  Filled 2022-03-24 (×5): qty 1

## 2022-03-24 MED ORDER — POTASSIUM CHLORIDE 10 MEQ/100ML IV SOLN
10.0000 meq | INTRAVENOUS | Status: DC
  Administered 2022-03-24 (×2): 10 meq via INTRAVENOUS
  Filled 2022-03-24 (×2): qty 100

## 2022-03-24 MED ORDER — SODIUM CHLORIDE 0.9 % IV BOLUS
1000.0000 mL | Freq: Once | INTRAVENOUS | Status: AC
  Administered 2022-03-24: 1000 mL via INTRAVENOUS

## 2022-03-24 MED ORDER — FOLIC ACID 1 MG PO TABS
1.0000 mg | ORAL_TABLET | Freq: Every day | ORAL | Status: DC
  Administered 2022-03-24 – 2022-03-28 (×5): 1 mg via ORAL
  Filled 2022-03-24 (×6): qty 1

## 2022-03-24 MED ORDER — POTASSIUM CHLORIDE CRYS ER 20 MEQ PO TBCR
20.0000 meq | EXTENDED_RELEASE_TABLET | Freq: Every day | ORAL | Status: DC
  Administered 2022-03-26 – 2022-03-28 (×3): 20 meq via ORAL
  Filled 2022-03-24 (×4): qty 1

## 2022-03-24 MED ORDER — FOLIC ACID 1 MG PO TABS: 1.0000 mg | ORAL_TABLET | Freq: Every day | ORAL | Status: DC

## 2022-03-24 MED ORDER — METOPROLOL TARTRATE 25 MG PO TABS
25.0000 mg | ORAL_TABLET | Freq: Two times a day (BID) | ORAL | Status: DC
  Administered 2022-03-24 – 2022-03-28 (×8): 25 mg via ORAL
  Filled 2022-03-24 (×8): qty 1

## 2022-03-24 MED ORDER — LORAZEPAM 1 MG PO TABS: 0.0000 mg | ORAL_TABLET | Freq: Four times a day (QID) | ORAL | Status: DC

## 2022-03-24 NOTE — H&P (Signed)
History and Physical    Patient: Stacy Dalton DOB: 08-06-1950 DOA: 03/24/2022 DOS: the patient was seen and examined on 03/24/2022 PCP: Pcp, No  Patient coming from: Home  Chief Complaint:  Chief Complaint  Patient presents with   Weakness   HPI: Stacy Dalton is a 71 y.o. female with medical history significant of hypertension, COPD, alcohol dependence, hypothyroidism, osteopenia.  Patient seen for feeling globally weak and confused.  She reports no fevers, chills, nausea, vomiting.  She denies headache, blurred vision, focal weakness.  She normally drinks half a bottle to 1/5 of vodka on a daily basis.  She has tried to reduce alcohol consumption in the past.  She currently drinks alcohol due to chronic pain.  Review of Systems: As mentioned in the history of present illness. All other systems reviewed and are negative. Past Medical History:  Diagnosis Date   Chronic pain    COPD (chronic obstructive pulmonary disease) ([place])    told has copd, no current inhaler use   Cough    Depression    ETOH abuse    GERD (gastroesophageal reflux disease)    Hypertension    Hypothyroidism    Insomnia    Migraine    Migraine    Osteopenia    Pain management    Panic attacks    Small bowel obstruction [facility])    Past Surgical History:  Procedure Laterality Date   ABDOMINAL HYSTERECTOMY     BIOPSY  08/29/2021   Procedure: BIOPSY;  Surgeon: [name], DO;  Location: [facility];  Service: Endoscopy;;   COLON SURGERY     COLOSTOMY CLOSURE  04/19/2012   Procedure: COLOSTOMY CLOSURE;  Surgeon: Adin Hector, MD;  Location: [facility];  Service: General;  Laterality: N/A;  Laparotomy, Resection and Closure of Colostomy   COLOSTOMY TAKEDOWN N/A 04/12/2016   Procedure: [place] TAKEDOWN;  Surgeon: Johnathan Hausen, MD;  Location: [facility];  Service: General;  Laterality: N/A;   ESOPHAGOGASTRODUODENOSCOPY (EGD) WITH PROPOFOL N/A 08/29/2021   Procedure:  ESOPHAGOGASTRODUODENOSCOPY (EGD) WITH PROPOFOL;  Surgeon: [name], DO;  Location: [facility];  Service: Endoscopy;  Laterality: N/A;   INCONTINENCE SURGERY     LAPAROTOMY  10/04/2011, colostomy also   Procedure: EXPLORATORY LAPAROTOMY;  Surgeon: Adin Hector, MD;  Location: [facility];  Service: General;  Laterality: N/A;  left partial colectomy with colostomy   LAPAROTOMY  04/19/2012   Procedure: EXPLORATORY LAPAROTOMY;  Surgeon: Adin Hector, MD;  Location: [facility];  Service: General;  Laterality: N/A;   LAPAROTOMY N/A 11/29/2015   Procedure: EXPLORATORY LAPAROTOMY; SUBTOTAL COLECTOMY WITH HARTMAN PROCEDURE AND END COLOSTOMY;  Surgeon: Johnathan Hausen, MD;  Location: [facility];  Service: General;  Laterality: N/A;   TUBAL LIGATION     VENTRAL HERNIA REPAIR  04/19/2012   Procedure: HERNIA REPAIR VENTRAL ADULT;  Surgeon: Adin Hector, MD;  Location: [facility];  Service: General;  Laterality: N/A;   Social History:  reports that she quit smoking about 23 years ago. Her smoking use included cigarettes. She has a 30.00 pack-year smoking history. She has never used smokeless tobacco. She reports current alcohol use. She reports that she does not use drugs.  Allergies  Allergen Reactions   Librium [Chlordiazepoxide]     Became unresponsive   Toradol [Ketorolac Tromethamine] Shortness Of Breath   Butalbital-Apap-Caffeine Other (See Comments)    jittery   Demerol Swelling   Esgic [Butalbital-Apap-Caffeine] Other (See Comments)  jittery   Iodine Other (See Comments)    bp bottomed out per pt several hours later; unsure if pre medicated in past with cm; done in [place]   Limonene     Other reaction(s): Other (See Comments) jittery   Meperidine Swelling   Pyridium [Phenazopyridine] Nausea Only    Family History  Problem Relation Age of Onset   Heart disease Mother    Hypertension Mother    Cancer Sister        sinus   Diabetes Brother    Heart disease Brother    Thyroid  disease Brother    Cancer Sister        abdominal ?   Thyroid disease Sister    Cancer Other        GE junction adenocarcinoma   Emphysema Father    Stroke Father    Hypertension Father    Heart disease Father    COPD Sister    Thyroid disease Sister    Thyroid disease Sister    Diabetes Brother    Thyroid disease Brother    Thyroid disease Brother    Hypertension Brother    Post-traumatic stress disorder Brother    COPD Brother    Thyroid disease Brother    Thyroid disease Brother    Hypertension Brother    Transient ischemic attack Brother     Prior to Admission medications   Medication Sig Start Date End Date Taking? Authorizing Provider  amLODipine (NORVASC) 10 MG tablet Take 1 tablet (10 mg total) by mouth daily. 10/11/21  Yes Emokpae, Courage, MD  albuterol (VENTOLIN HFA) 108 (90 Base) MCG/ACT inhaler Inhale 2 puffs into the lungs every 4 (four) hours as needed for wheezing or shortness of breath. Patient not taking: Reported on 10/18/2021 10/11/21   Roxan Hockey, MD  cephALEXin (KEFLEX) 500 MG capsule Take 1 capsule (500 mg total) by mouth 2 (two) times daily for 7 days. Patient not taking: Reported on 03/24/2022 03/18/22 03/25/22  Marcello Fennel, PA-C  cyclobenzaprine (FLEXERIL) 10 MG tablet Take 1 tablet (10 mg total) by mouth daily as needed for muscle spasms. Patient not taking: Reported on 03/24/2022 03/18/22   Marcello Fennel, PA-C  dicyclomine (BENTYL) 20 MG tablet Take 1 tablet (20 mg total) by mouth daily as needed for spasms. 03/18/22   Marcello Fennel, PA-C  DULoxetine (CYMBALTA) 60 MG capsule Take 1 capsule (60 mg total) by mouth daily. Patient not taking: Reported on 10/18/2021 10/11/21   Roxan Hockey, MD  famotidine (PEPCID) 20 MG tablet Take 1 tablet (20 mg total) by mouth 2 (two) times daily. Patient not taking: Reported on 03/24/2022 03/18/22   Marcello Fennel, PA-C  folic acid (FOLVITE) 1 MG tablet Take 1 tablet (1 mg total) by mouth  daily. Patient not taking: Reported on 10/18/2021 10/11/21   Roxan Hockey, MD  hydrOXYzine (VISTARIL) 25 MG capsule Take 1 capsule (25 mg total) by mouth every 4 (four) hours as needed for anxiety. Patient not taking: Reported on 10/18/2021 10/11/21   Roxan Hockey, MD  levothyroxine (SYNTHROID) 50 MCG tablet Take 1 tablet (50 mcg total) by mouth daily before breakfast. Patient not taking: Reported on 10/18/2021 10/11/21   Roxan Hockey, MD  losartan (COZAAR) 50 MG tablet Take 1 tablet (50 mg total) by mouth daily. Patient not taking: Reported on 10/18/2021 10/11/21   Roxan Hockey, MD  magnesium oxide (MAG-OX) 400 (240 Mg) MG tablet Take 1 tablet (400 mg total) by mouth daily.  Patient not taking: Reported on 10/18/2021 10/11/21   Roxan Hockey, MD  metoprolol tartrate (LOPRESSOR) 25 MG tablet Take 1 tablet (25 mg total) by mouth 2 (two) times daily. Patient not taking: Reported on 10/18/2021 10/11/21   Roxan Hockey, MD  Multiple Vitamin (MULTIVITAMIN WITH MINERALS) TABS tablet Take 1 tablet by mouth daily. Patient not taking: Reported on 10/18/2021 10/11/21   Roxan Hockey, MD  ondansetron (ZOFRAN) 4 MG tablet Take 1 tablet (4 mg total) by mouth every 6 (six) hours. Patient not taking: Reported on 03/24/2022 03/18/22   Marcello Fennel, PA-C  pantoprazole (PROTONIX) 40 MG tablet Take 1 tablet (40 mg total) by mouth 2 (two) times daily. Patient not taking: Reported on 10/18/2021 10/11/21   Roxan Hockey, MD  potassium chloride SA (KLOR-CON M) 20 MEQ tablet Take 1 tablet (20 mEq total) by mouth daily. 12/18/21   Orpah Greek, MD  sucralfate (CARAFATE) 1 GM/10ML suspension Take 10 mLs (1 g total) by mouth 4 (four) times daily -  with meals and at bedtime. Patient not taking: Reported on 10/18/2021 10/11/21   Roxan Hockey, MD  thiamine 100 MG tablet Take 1 tablet (100 mg total) by mouth daily. Patient not taking: Reported on 10/18/2021 10/11/21   Roxan Hockey, MD  vitamin  B-12 (CYANOCOBALAMIN) 1000 MCG tablet Take 1 tablet (1,000 mcg total) by mouth daily. Patient not taking: Reported on 10/18/2021 10/11/21   Roxan Hockey, MD    Physical Exam: Vitals:   03/24/22 1511 03/24/22 1530 03/24/22 1600 03/24/22 1605  BP:  (!) 199/93 (!) 181/87   Pulse:  (!) 119 (!) 119 (!) 109  Resp:  '20 18 17  '$ Temp: 98.8 F (37.1 C)     TempSrc: Oral     SpO2:  93% 93% 93%  Weight:      Height:       General: Elderly female. Awake and alert and oriented x3. No acute cardiopulmonary distress.  HEENT: Normocephalic atraumatic.  Right and left ears normal in appearance.  Pupils equal, round, reactive to light. Extraocular muscles are intact. Sclerae anicteric and noninjected.  Moist mucosal membranes. No mucosal lesions.  Neck: Neck supple without lymphadenopathy. No carotid bruits. No masses palpated.  Cardiovascular: Tachycardic rate with normal S1-S2 sounds. No murmurs, rubs, gallops auscultated. No JVD.  Respiratory: Good respiratory effort with no wheezes, rales, rhonchi. Lungs clear to auscultation bilaterally.  No accessory muscle use. Abdomen: Soft, nontender, nondistended. Active bowel sounds. No masses or hepatosplenomegaly  Skin: No rashes, lesions, or ulcerations.  Dry, warm to touch. 2+ dorsalis pedis and radial pulses. Musculoskeletal: No calf or leg pain. All major joints not erythematous nontender.  No upper or lower joint deformation.  Good ROM.  No contractures  Psychiatric: Intact judgment and insight. Pleasant and cooperative. Neurologic: Alcohol withdrawal tremor.  No focal neurological deficits. Strength is 5/5 and symmetric in upper and lower extremities.  Cranial nerves II through XII are grossly intact.  Data Reviewed: { Results for orders placed or performed during the hospital encounter of 03/24/22 (from the past 24 hour(s))  Urinalysis, Routine w reflex microscopic Urine, Clean Catch     Status: Abnormal   Collection Time: 03/24/22  9:57 AM  Result  Value Ref Range   Color, Urine YELLOW YELLOW   APPearance CLEAR CLEAR   Specific Gravity, Urine 1.013 1.005 - 1.030   pH 5.0 5.0 - 8.0   Glucose, UA NEGATIVE NEGATIVE mg/dL   Hgb urine dipstick NEGATIVE NEGATIVE   Bilirubin  Urine NEGATIVE NEGATIVE   Ketones, ur 5 (A) NEGATIVE mg/dL   Protein, ur 30 (A) NEGATIVE mg/dL   Nitrite NEGATIVE NEGATIVE   Leukocytes,Ua SMALL (A) NEGATIVE   RBC / HPF 0-5 0 - 5 RBC/hpf   WBC, UA 21-50 0 - 5 WBC/hpf   Bacteria, UA RARE (A) NONE SEEN   Squamous Epithelial / LPF 0-5 0 - 5   Mucus PRESENT   Rapid urine drug screen (hospital performed)     Status: None   Collection Time: 03/24/22  9:57 AM  Result Value Ref Range   Opiates NONE DETECTED NONE DETECTED   Cocaine NONE DETECTED NONE DETECTED   Benzodiazepines NONE DETECTED NONE DETECTED   Amphetamines NONE DETECTED NONE DETECTED   Tetrahydrocannabinol NONE DETECTED NONE DETECTED   Barbiturates NONE DETECTED NONE DETECTED  CBC     Status: Abnormal   Collection Time: 03/24/22 10:12 AM  Result Value Ref Range   WBC 3.9 (L) 4.0 - 10.5 K/uL   RBC 4.40 3.87 - 5.11 MIL/uL   Hemoglobin 11.2 (L) 12.0 - 15.0 g/dL   HCT 34.8 (L) 36.0 - 46.0 %   MCV 79.1 (L) 80.0 - 100.0 fL   MCH 25.5 (L) 26.0 - 34.0 pg   MCHC 32.2 30.0 - 36.0 g/dL   RDW 17.6 (H) 11.5 - 15.5 %   Platelets 207 150 - 400 K/uL   nRBC 0.0 0.0 - 0.2 %  Basic metabolic panel     Status: Abnormal   Collection Time: 03/24/22 10:12 AM  Result Value Ref Range   Sodium 141 135 - 145 mmol/L   Potassium 3.1 (L) 3.5 - 5.1 mmol/L   Chloride 102 98 - 111 mmol/L   CO2 22 22 - 32 mmol/L   Glucose, Bld 105 (H) 70 - 99 mg/dL   BUN 15 8 - 23 mg/dL   Creatinine, Ser 0.74 0.44 - 1.00 mg/dL   Calcium 8.9 8.9 - 10.3 mg/dL   GFR, Estimated >60 >60 mL/min   Anion gap 17 (H) 5 - 15  Ethanol     Status: Abnormal   Collection Time: 03/24/22 10:12 AM  Result Value Ref Range   Alcohol, Ethyl (B) 124 (H) <10 mg/dL  Magnesium     Status: Abnormal   Collection  Time: 03/24/22  1:40 PM  Result Value Ref Range   Magnesium 1.4 (L) 1.7 - 2.4 mg/dL   CT Head Wo Contrast  Result Date: 03/24/2022 CLINICAL DATA:  Mental status change, unknown cause EXAM: CT HEAD WITHOUT CONTRAST TECHNIQUE: Contiguous axial images were obtained from the base of the skull through the vertex without intravenous contrast. RADIATION DOSE REDUCTION: This exam was performed according to the departmental dose-optimization program which includes automated exposure control, adjustment of the mA and/or kV according to patient size and/or use of iterative reconstruction technique. COMPARISON:  10/18/2021 FINDINGS: Brain: No acute intracranial abnormality. Specifically, no hemorrhage, hydrocephalus, mass lesion, acute infarction, or significant intracranial injury. Vascular: No hyperdense vessel or unexpected calcification. Skull: No acute calvarial abnormality. Sinuses/Orbits: No acute findings Other: None IMPRESSION: No acute intracranial abnormality. Electronically Signed   By: Rolm Baptise M.D.   On: 03/24/2022 11:12     Assessment and Plan: No notes have been filed under this hospital service. Service: Hospitalist  Principal Problem:   Alcohol withdrawal ([place]) Active Problems:   COPD (chronic obstructive pulmonary disease) ([facility])   Hypertension   Hypothyroidism   Hypokalemia   Hypertensive urgency  Alcohol withdrawal Ativan scheduled with  as needed. Telemetry monitoring on medical floor Hypertensive urgency We will restart's patient's home antihypertensive. Metoprolol 10 mg IV given Hypokalemia We will replace and recheck potassium in the morning Hypothyroidism It appears the patient has not been taking her thyroid medication.  We will check TSH and restart medication COPD   Advance Care Planning:   Code Status: Full Code   Consults: None  Family Communication: None  Severity of Illness: The appropriate patient status for this patient is INPATIENT. Inpatient status  is judged to be reasonable and necessary in order to provide the required intensity of service to ensure the patient's safety. The patient's presenting symptoms, physical exam findings, and initial radiographic and laboratory data in the context of their chronic comorbidities is felt to place them at high risk for further clinical deterioration. Furthermore, it is not anticipated that the patient will be medically stable for discharge from the hospital within 2 midnights of admission.   * I certify that at the point of admission it is my clinical judgment that the patient will require inpatient hospital care spanning beyond 2 midnights from the point of admission due to high intensity of service, high risk for further deterioration and high frequency of surveillance required.*  Author: Truett Mainland, DO 03/24/2022 4:23 PM  For on call review [email or web address].

## 2022-03-24 NOTE — ED Notes (Signed)
Gave pt her lunch tray and is eating at this time.

## 2022-03-24 NOTE — ED Notes (Signed)
Patient complaining of chest pressure, increased heart rate. EKG completed and PA caring for patient aware. Patient continues to endorse nausea and trembling. Repeatedly asking for water after drinking small amount earlier making nausea worse. Advised that she is not to drink anymore at this time.

## 2022-03-24 NOTE — ED Provider Notes (Signed)
[facility] EMERGENCY DEPARTMENT Provider Note   CSN: [id number] Arrival date & time: 03/24/22  7253     History  Chief Complaint  Patient presents with   Weakness    Stacy Dalton is a 71 y.o. female with history of HTN, depression, COPD, GERD, hypothyroidism, SBO, alcohol dependency who presents to the emergency department complaining of "not feeling right". Endorses headache with feeling that her "head shouldn't be there" as well as persistent chronic back pain. Unsure if her symptoms started upon waking up or after waking up. Reports drinking ETOH all day yesterday, none today.   Complained to triage provider her left shoulder was bothering her, unsure if she injured it or not.  Patient is a poor historian. Continues answering questions with "I'm sick" and "I don't feel right", and unable to clarify further.    Weakness Associated symptoms: headaches and nausea   Associated symptoms: no abdominal pain, no dizziness and no vomiting        Home Medications Prior to Admission medications   Medication Sig Start Date End Date Taking? Authorizing Provider  amLODipine (NORVASC) 10 MG tablet Take 1 tablet (10 mg total) by mouth daily. 10/11/21  Yes Emokpae, Courage, MD  albuterol (VENTOLIN HFA) 108 (90 Base) MCG/ACT inhaler Inhale 2 puffs into the lungs every 4 (four) hours as needed for wheezing or shortness of breath. Patient not taking: Reported on 10/18/2021 10/11/21   Roxan Hockey, MD  cephALEXin (KEFLEX) 500 MG capsule Take 1 capsule (500 mg total) by mouth 2 (two) times daily for 7 days. Patient not taking: Reported on 03/24/2022 03/18/22 03/25/22  Marcello Fennel, PA-C  cyclobenzaprine (FLEXERIL) 10 MG tablet Take 1 tablet (10 mg total) by mouth daily as needed for muscle spasms. Patient not taking: Reported on 03/24/2022 03/18/22   Marcello Fennel, PA-C  dicyclomine (BENTYL) 20 MG tablet Take 1 tablet (20 mg total) by mouth daily as needed for spasms. 03/18/22    Marcello Fennel, PA-C  DULoxetine (CYMBALTA) 60 MG capsule Take 1 capsule (60 mg total) by mouth daily. Patient not taking: Reported on 10/18/2021 10/11/21   Roxan Hockey, MD  famotidine (PEPCID) 20 MG tablet Take 1 tablet (20 mg total) by mouth 2 (two) times daily. Patient not taking: Reported on 03/24/2022 03/18/22   Marcello Fennel, PA-C  folic acid (FOLVITE) 1 MG tablet Take 1 tablet (1 mg total) by mouth daily. Patient not taking: Reported on 10/18/2021 10/11/21   Roxan Hockey, MD  hydrOXYzine (VISTARIL) 25 MG capsule Take 1 capsule (25 mg total) by mouth every 4 (four) hours as needed for anxiety. Patient not taking: Reported on 10/18/2021 10/11/21   Roxan Hockey, MD  levothyroxine (SYNTHROID) 50 MCG tablet Take 1 tablet (50 mcg total) by mouth daily before breakfast. Patient not taking: Reported on 10/18/2021 10/11/21   Roxan Hockey, MD  losartan (COZAAR) 50 MG tablet Take 1 tablet (50 mg total) by mouth daily. Patient not taking: Reported on 10/18/2021 10/11/21   Roxan Hockey, MD  magnesium oxide (MAG-OX) 400 (240 Mg) MG tablet Take 1 tablet (400 mg total) by mouth daily. Patient not taking: Reported on 10/18/2021 10/11/21   Roxan Hockey, MD  metoprolol tartrate (LOPRESSOR) 25 MG tablet Take 1 tablet (25 mg total) by mouth 2 (two) times daily. Patient not taking: Reported on 10/18/2021 10/11/21   Roxan Hockey, MD  Multiple Vitamin (MULTIVITAMIN WITH MINERALS) TABS tablet Take 1 tablet by mouth daily. Patient not taking: Reported on 10/18/2021 10/11/21  Roxan Hockey, MD  ondansetron (ZOFRAN) 4 MG tablet Take 1 tablet (4 mg total) by mouth every 6 (six) hours. Patient not taking: Reported on 03/24/2022 03/18/22   Marcello Fennel, PA-C  pantoprazole (PROTONIX) 40 MG tablet Take 1 tablet (40 mg total) by mouth 2 (two) times daily. Patient not taking: Reported on 10/18/2021 10/11/21   Roxan Hockey, MD  potassium chloride SA (KLOR-CON M) 20 MEQ tablet Take 1 tablet  (20 mEq total) by mouth daily. 12/18/21   Orpah Greek, MD  sucralfate (CARAFATE) 1 GM/10ML suspension Take 10 mLs (1 g total) by mouth 4 (four) times daily -  with meals and at bedtime. Patient not taking: Reported on 10/18/2021 10/11/21   Roxan Hockey, MD  thiamine 100 MG tablet Take 1 tablet (100 mg total) by mouth daily. Patient not taking: Reported on 10/18/2021 10/11/21   Roxan Hockey, MD  vitamin B-12 (CYANOCOBALAMIN) 1000 MCG tablet Take 1 tablet (1,000 mcg total) by mouth daily. Patient not taking: Reported on 10/18/2021 10/11/21   Roxan Hockey, MD      Allergies    Librium [chlordiazepoxide], Toradol [ketorolac tromethamine], Butalbital-apap-caffeine, Demerol, Esgic [butalbital-apap-caffeine], Iodine, Limonene, Meperidine, and Pyridium [phenazopyridine]    Review of Systems   Review of Systems  Eyes:  Negative for visual disturbance.  Gastrointestinal:  Positive for nausea. Negative for abdominal pain and vomiting.  Musculoskeletal:  Positive for back pain.  Neurological:  Positive for tremors, weakness and headaches. Negative for dizziness, speech difficulty and light-headedness.  All other systems reviewed and are negative.   Physical Exam Updated Vital Signs BP (!) 181/87   Pulse 84   Temp 98.8 F (37.1 C) (Oral)   Resp 17   Ht '5\' 2"'$  (1.575 m)   Wt 56.7 kg   SpO2 93%   BMI 22.86 kg/m  Physical Exam Vitals and nursing note reviewed.  Constitutional:      Appearance: Normal appearance.  HENT:     Head: Normocephalic and atraumatic.  Eyes:     Conjunctiva/sclera: Conjunctivae normal.  Cardiovascular:     Rate and Rhythm: Regular rhythm. Tachycardia present.  Pulmonary:     Effort: Pulmonary effort is normal. No respiratory distress.     Breath sounds: Normal breath sounds.  Abdominal:     General: There is no distension.     Palpations: Abdomen is soft.     Tenderness: There is no abdominal tenderness.  Skin:    General: Skin is warm and  dry.  Neurological:     General: No focal deficit present.     Mental Status: She is alert.     Motor: Tremor present.     ED Results / Procedures / Treatments   Labs (all labs ordered are listed, but only abnormal results are displayed) Labs Reviewed  CBC - Abnormal; Notable for the following components:      Result Value   WBC 3.9 (*)    Hemoglobin 11.2 (*)    HCT 34.8 (*)    MCV 79.1 (*)    MCH 25.5 (*)    RDW 17.6 (*)    All other components within normal limits  BASIC METABOLIC PANEL - Abnormal; Notable for the following components:   Potassium 3.1 (*)    Glucose, Bld 105 (*)    Anion gap 17 (*)    All other components within normal limits  ETHANOL - Abnormal; Notable for the following components:   Alcohol, Ethyl (B) 124 (*)    All other  components within normal limits  URINALYSIS, ROUTINE W REFLEX MICROSCOPIC - Abnormal; Notable for the following components:   Ketones, ur 5 (*)    Protein, ur 30 (*)    Leukocytes,Ua SMALL (*)    Bacteria, UA RARE (*)    All other components within normal limits  MAGNESIUM - Abnormal; Notable for the following components:   Magnesium 1.4 (*)    All other components within normal limits  RAPID URINE DRUG SCREEN, HOSP PERFORMED  TSH    EKG EKG Interpretation  Date/Time:  Friday March 24 2022 11:19:41 EDT Ventricular Rate:  121 PR Interval:  116 QRS Duration: 84 QT Interval:  323 QTC Calculation: 459 R Axis:   67 Text Interpretation: Sinus tachycardia Multiform ventricular premature complexes Low voltage, extremity and precordial leads Confirmed by Garnette Gunner [phone]) on 03/24/2022 1:35:52 PM  Radiology CT Head Wo Contrast  Result Date: 03/24/2022 CLINICAL DATA:  Mental status change, unknown cause EXAM: CT HEAD WITHOUT CONTRAST TECHNIQUE: Contiguous axial images were obtained from the base of the skull through the vertex without intravenous contrast. RADIATION DOSE REDUCTION: This exam was performed according to  the departmental dose-optimization program which includes automated exposure control, adjustment of the mA and/or kV according to patient size and/or use of iterative reconstruction technique. COMPARISON:  10/18/2021 FINDINGS: Brain: No acute intracranial abnormality. Specifically, no hemorrhage, hydrocephalus, mass lesion, acute infarction, or significant intracranial injury. Vascular: No hyperdense vessel or unexpected calcification. Skull: No acute calvarial abnormality. Sinuses/Orbits: No acute findings Other: None IMPRESSION: No acute intracranial abnormality. Electronically Signed   By: Rolm Baptise M.D.   On: 03/24/2022 11:12    Procedures Procedures    Medications Ordered in ED Medications  LORazepam (ATIVAN) injection 0-4 mg (2 mg Intravenous Given 03/24/22 1133)    Or  LORazepam (ATIVAN) tablet 0-4 mg ( Oral See Alternative 03/24/22 1133)  LORazepam (ATIVAN) injection 0-4 mg (has no administration in time range)    Or  LORazepam (ATIVAN) tablet 0-4 mg (has no administration in time range)  thiamine (VITAMIN B1) tablet 100 mg ( Oral See Alternative 03/24/22 1132)    Or  thiamine (VITAMIN B1) injection 100 mg (100 mg Intravenous Given 03/24/22 1132)  potassium chloride 10 mEq in 100 mL IVPB (10 mEq Intravenous New Bag/Given 03/24/22 1634)  ondansetron (ZOFRAN) injection 4 mg (4 mg Intravenous Given 03/24/22 1055)  fentaNYL (SUBLIMAZE) injection 50 mcg (50 mcg Intravenous Given 03/24/22 1056)  potassium chloride SA (KLOR-CON M) CR tablet 40 mEq (40 mEq Oral Given 03/24/22 1500)  magnesium sulfate IVPB 2 g 50 mL (0 g Intravenous Stopped 03/24/22 1627)  morphine (PF) 4 MG/ML injection 4 mg (4 mg Intravenous Given 03/24/22 1502)  LORazepam (ATIVAN) injection 2 mg (2 mg Intravenous Given 03/24/22 1502)  sodium chloride 0.9 % bolus 1,000 mL (1,000 mLs Intravenous New Bag/Given 03/24/22 1537)  metoprolol tartrate (LOPRESSOR) tablet 25 mg (25 mg Oral Given 03/24/22 1537)  metoprolol tartrate (LOPRESSOR)  injection 10 mg (10 mg Intravenous Given 03/24/22 1632)    ED Course/ Medical Decision Making/ A&P                           Medical Decision Making  This patient is a 71 y.o. female  who presents to the ED for concern of "not feeling right". History of chronic alcohol dependence, last drink last night.  Differential diagnoses prior to evaluation: The emergent differential diagnosis includes, but is not limited to,  sepsis, stroke, head trauma, drug/ETOH intoxication or withdrawal, electrolyte disturbance. This is not an exhaustive differential.   Past Medical History / Co-morbidities: HTN, depression, COPD, GERD, hypothyroidism, SBO, alcohol dependency  Additional history: Chart reviewed. Pertinent results include: Has been seen several times in this department for similar symptoms related to chronic alcohol abuse, most recently admitted in April 2023 with intractable nausea and vomiting.  Physical Exam: Physical exam performed. The pertinent findings include: Patient tachycardic and hypertensive.  Slight tremor noted to bilateral hands.  Some nausea, made worse with abdominal palpation.  Abdomen nontender.  Lab Tests/Imaging studies: I personally interpreted labs/imaging and the pertinent results include: CBC unremarkable.  Potassium 3.1, magnesium 1.4.  Ethanol 124.  Urinalysis with small leukocytes, negative for hematuria.  UDS negative.  CT head shows no acute intracranial abnormalities.. I agree with the radiologist interpretation.  Cardiac monitoring: EKG obtained and interpreted by my attending physician which shows: sinus tachycardia   Medications: I ordered medication including ativan per CIWA protocol, thiamine, potassium, magnesium, analgesia, and metoprolol.  I have reviewed the patients home medicines and have made adjustments as needed.  Consultations obtained: Consulted with hospitalist Dr Nehemiah Settle who will admit.    Disposition: After consideration of the  diagnostic results and the patients response to treatment, I feel that patient would benefit from medical admission for acute alcohol withdrawals.  I discussed this case with my attending physician Dr. Truett Mainland who cosigned this note including patient's presenting symptoms, physical exam, and planned diagnostics and interventions. Attending physician stated agreement with plan or made changes to plan which were implemented.   Final Clinical Impression(s) / ED Diagnoses Final diagnoses:  Chronic alcohol dependence, continuous ([place])  Alcohol withdrawal syndrome without complication (H[facility])  Hypokalemia  Hypomagnesemia    Rx / DC Orders ED Discharge Orders     None      Portions of this report may have been transcribed using voice recognition software. Every effort was made to ensure accuracy; however, inadvertent computerized transcription errors may be present.    Estill Cotta 03/24/22 1708    Cristie Hem, MD 03/25/22 [phone]

## 2022-03-24 NOTE — ED Triage Notes (Addendum)
BIB by EMS with complaints of generalized weakness present upon waking this am. States that her head is hurting and she can barely hold it up. Reports drinking ETOH all day yesterday with none this am. Hands trembling. Endorses nausea. Does not recall what time she went to bed last night. Complaining of left shoulder pain, denies any falls or known injury. No neuro deficits noted.

## 2022-03-25 ENCOUNTER — Encounter (HOSPITAL_COMMUNITY): Payer: Self-pay | Admitting: Family Medicine

## 2022-03-25 DIAGNOSIS — F10931 Alcohol use, unspecified with withdrawal delirium: Secondary | ICD-10-CM

## 2022-03-25 DIAGNOSIS — I10 Essential (primary) hypertension: Secondary | ICD-10-CM | POA: Diagnosis not present

## 2022-03-25 DIAGNOSIS — J449 Chronic obstructive pulmonary disease, unspecified: Secondary | ICD-10-CM | POA: Diagnosis not present

## 2022-03-25 DIAGNOSIS — E876 Hypokalemia: Secondary | ICD-10-CM | POA: Diagnosis not present

## 2022-03-25 LAB — BASIC METABOLIC PANEL
Anion gap: 9 (ref 5–15)
BUN: 8 mg/dL (ref 8–23)
CO2: 24 mmol/L (ref 22–32)
Calcium: 8.4 mg/dL — ABNORMAL LOW (ref 8.9–10.3)
Chloride: 102 mmol/L (ref 98–111)
Creatinine, Ser: 0.68 mg/dL (ref 0.44–1.00)
GFR, Estimated: >60 mL/min (ref >60)
Glucose, Bld: 129 mg/dL — ABNORMAL HIGH (ref 70–99)
Potassium: 2.9 mmol/L — ABNORMAL LOW (ref 3.5–5.1)
Sodium: 135 mmol/L (ref 135–145)

## 2022-03-25 LAB — CBC
HCT: 32.9 % — ABNORMAL LOW (ref 36.0–46.0)
Hemoglobin: 10.4 g/dL — ABNORMAL LOW (ref 12.0–15.0)
MCH: 25.5 pg — ABNORMAL LOW (ref 26.0–34.0)
MCHC: 31.6 g/dL (ref 30.0–36.0)
MCV: 80.6 fL (ref 80.0–100.0)
Platelets: 143 10*3/uL — ABNORMAL LOW (ref 150–400)
RBC: 4.08 MIL/uL (ref 3.87–5.11)
RDW: 17.9 % — ABNORMAL HIGH (ref 11.5–15.5)
WBC: 5 10*3/uL (ref 4.0–10.5)
nRBC: 0 % (ref 0.0–0.2)

## 2022-03-25 LAB — MAGNESIUM: Magnesium: 1.6 mg/dL — ABNORMAL LOW (ref 1.7–2.4)

## 2022-03-25 MED ORDER — ORAL CARE MOUTH RINSE: 15.0000 mL | OROMUCOSAL | Status: DC | PRN

## 2022-03-25 MED ORDER — DIAZEPAM 2 MG PO TABS
2.0000 mg | ORAL_TABLET | Freq: Three times a day (TID) | ORAL | Status: DC
  Administered 2022-03-25 – 2022-03-27 (×5): 2 mg via ORAL
  Filled 2022-03-25 (×5): qty 1

## 2022-03-25 MED ORDER — POTASSIUM CHLORIDE CRYS ER 20 MEQ PO TBCR
40.0000 meq | EXTENDED_RELEASE_TABLET | ORAL | Status: AC
  Administered 2022-03-25 (×2): 40 meq via ORAL
  Filled 2022-03-25 (×2): qty 2

## 2022-03-25 MED ORDER — HYDRALAZINE HCL 20 MG/ML IJ SOLN: 10.0000 mg | Freq: Four times a day (QID) | INTRAMUSCULAR | Status: DC | PRN

## 2022-03-25 MED ORDER — OXYCODONE HCL 5 MG PO TABS
5.0000 mg | ORAL_TABLET | Freq: Once | ORAL | Status: AC
  Administered 2022-03-25: 5 mg via ORAL
  Filled 2022-03-25: qty 1

## 2022-03-25 MED ORDER — LOPERAMIDE HCL 2 MG PO CAPS
4.0000 mg | ORAL_CAPSULE | Freq: Once | ORAL | Status: AC
  Administered 2022-03-26: 4 mg via ORAL
  Filled 2022-03-25: qty 2

## 2022-03-25 MED ORDER — DIAZEPAM 5 MG PO TABS: 5.0000 mg | ORAL_TABLET | Freq: Once | ORAL | Status: DC

## 2022-03-25 NOTE — Progress Notes (Signed)
PROGRESS NOTE     Stacy Dalton, is a 71 y.o. female, DOB - Feb 26, 1951, OVF:[id number]  Admit date - 03/24/2022   Admitting Physician Truett Mainland, DO  Outpatient Primary MD for the patient is Pcp, No  LOS - 1  Chief Complaint  Patient presents with   Weakness        Brief Narrative:  71 y.o. female with medical history significant of hypertension, COPD, alcohol dependence, hypothyroidism, osteopenia admitted on 03/24/2022 with alcohol withdrawal   -Assessment and Plan:  1)DTs--- Full Blown delirium tremens patient admits she drinks more than 1 L of vodka each day -Benzos and multivitamin  2)Hypokalemia/hypomagnesemia in an alcoholic female----replace and recheck  3)HTN--continue amlodipine, losartan and metoprolol -May use IV hydralazine as needed elevated BP  4) COPD--no acute exacerbation at this time as needed bronchodilators  5) hypothyroidism--- continue levothyroxine   Disposition/Need for in-Hospital Stay- patient unable to be discharged at this time due to --alcohol withdrawal/DTs requiring IV benzos and close observation*  Status is: Inpatient   Disposition: The patient is from: Home              Anticipated d/c is to: Home              Anticipated d/c date is: 2 days              Patient currently is not medically stable to d/c. Barriers: Not Clinically Stable-   Code Status :  -  Code Status: Full Code   Family Communication:    NA (patient is alert, awake and coherent)   DVT Prophylaxis  :   - SCDs  enoxaparin (LOVENOX) injection 40 mg Start: 03/24/22 1800   Lab Results  Component Value Date   PLT 143 (L) 03/25/2022    Inpatient Medications  Scheduled Meds:  amLODipine  10 mg Oral Daily   diazepam  2 mg Oral TID   diazepam  5 mg Oral Once   enoxaparin (LOVENOX) injection  40 mg Subcutaneous A41Y   folic acid  1 mg Oral Daily   levothyroxine  50 mcg Oral Q0600   LORazepam  0-4 mg Oral Q6H   Followed by   Derrill Memo ON 03/26/2022]  LORazepam  0-4 mg Oral Q12H   losartan  50 mg Oral Daily   metoprolol tartrate  25 mg Oral BID   multivitamin with minerals  1 tablet Oral Daily   potassium chloride SA  20 mEq Oral Daily   thiamine  100 mg Oral Daily   Or   thiamine  100 mg Intravenous Daily   Continuous Infusions: PRN Meds:.LORazepam **OR** LORazepam, ondansetron **OR** ondansetron (ZOFRAN) IV, mouth rinse   Anti-infectives (From admission, onward)    None       Subjective: Stacy Dalton today has no fevers, no emesis,  No chest pain,   -Patient is restless, anxious -No significant confusion   Objective: Vitals:   03/24/22 2301 03/25/22 0517 03/25/22 0630 03/25/22 1224  BP: (!) 143/74 (!) 170/91 (!) 174/83 (!) 158/90  Pulse: (!) 101 100 94 87  Resp: '18 20  20  '$ Temp: 98.1 F (36.7 C) 97.8 F (36.6 C)  97.8 F (36.6 C)  TempSrc: Oral   Oral  SpO2: 98% 95%  98%  Weight:      Height:        Intake/Output Summary (Last 24 hours) at 03/25/2022 1757 Last data filed at 03/25/2022 1200 Gross per 24 hour  Intake 440 ml  Output 2501  ml  Net -2061 ml   Filed Weights   03/24/22 0948  Weight: 56.7 kg    Physical Exam  Gen:- Awake Alert, anxious HEENT:- [place].AT, No sclera icterus Neck-Supple Neck,No JVD,.  Lungs-  CTAB , fair symmetrical air movement CV- S1, S2 normal, regular  Abd-  +ve B.Sounds, Abd Soft, No tenderness,    Extremity/Skin:- No  edema, pedal pulses present  Psych-affect is anxious, oriented x3 Neuro-no new focal deficits, significant tremors  Data Reviewed: I have personally reviewed following labs and imaging studies  CBC: Recent Labs  Lab 03/24/22 1012 03/25/22 0603  WBC 3.9* 5.0  HGB 11.2* 10.4*  HCT 34.8* 32.9*  MCV 79.1* 80.6  PLT 207 494*   Basic Metabolic Panel: Recent Labs  Lab 03/24/22 1012 03/24/22 1340 03/25/22 0603  NA 141  --  135  K 3.1*  --  2.9*  CL 102  --  102  CO2 22  --  24  GLUCOSE 105*  --  129*  BUN 15  --  8  CREATININE 0.74  --  0.68   CALCIUM 8.9  --  8.4*  MG  --  1.4*  --    GFR: Estimated Creatinine Clearance: 49.5 mL/min (by C-G formula based on SCr of 0.68 mg/dL). Recent Results (from the past 240 hour(s))  Urine Culture     Status: Abnormal   Collection Time: 03/18/22  6:20 PM   Specimen: Urine, Clean Catch  Result Value Ref Range Status   Specimen Description   Final    URINE, CLEAN CATCH Performed at [facility], [address], [place], [place] [address]    Special Requests   Final    NONE Performed at [facility], [address] [place], [place] [address]    Culture MULTIPLE SPECIES PRESENT, SUGGEST RECOLLECTION (A)  Final   Report Status 03/20/2022 FINAL  Final      Radiology Studies: CT Head Wo Contrast  Result Date: 03/24/2022 CLINICAL DATA:  Mental status change, unknown cause EXAM: CT HEAD WITHOUT CONTRAST TECHNIQUE: Contiguous axial images were obtained from the base of the skull through the vertex without intravenous contrast. RADIATION DOSE REDUCTION: This exam was performed according to the departmental dose-optimization program which includes automated exposure control, adjustment of the mA and/or kV according to patient size and/or use of iterative reconstruction technique. COMPARISON:  10/18/2021 FINDINGS: Brain: No acute intracranial abnormality. Specifically, no hemorrhage, hydrocephalus, mass lesion, acute infarction, or significant intracranial injury. Vascular: No hyperdense vessel or unexpected calcification. Skull: No acute calvarial abnormality. Sinuses/Orbits: No acute findings Other: None IMPRESSION: No acute intracranial abnormality. Electronically Signed   By: Rolm Baptise M.D.   On: 03/24/2022 11:12     Scheduled Meds:  amLODipine  10 mg Oral Daily   diazepam  2 mg Oral TID   diazepam  5 mg Oral Once   enoxaparin (LOVENOX) injection  40 mg Subcutaneous G66Z   folic acid  1 mg Oral Daily   levothyroxine  50 mcg Oral Q0600   LORazepam  0-4 mg Oral Q6H   Followed by   Derrill Memo  ON 03/26/2022] LORazepam  0-4 mg Oral Q12H   losartan  50 mg Oral Daily   metoprolol tartrate  25 mg Oral BID   multivitamin with minerals  1 tablet Oral Daily   potassium chloride SA  20 mEq Oral Daily   thiamine  100 mg Oral Daily   Or   thiamine  100 mg Intravenous Daily   Continuous  Infusions:   LOS: 1 day    Roxan Hockey M.D on 03/25/2022 at 5:57 PM  Go to [email or web address] - for contact info  [facility] - Office  [phone]  If 7PM-7AM, please contact night-coverage [email or web address] 03/25/2022, 5:57 PM

## 2022-03-26 DIAGNOSIS — E038 Other specified hypothyroidism: Secondary | ICD-10-CM | POA: Diagnosis not present

## 2022-03-26 DIAGNOSIS — I10 Essential (primary) hypertension: Secondary | ICD-10-CM | POA: Diagnosis not present

## 2022-03-26 DIAGNOSIS — E876 Hypokalemia: Secondary | ICD-10-CM | POA: Diagnosis not present

## 2022-03-26 DIAGNOSIS — F10931 Alcohol use, unspecified with withdrawal delirium: Secondary | ICD-10-CM | POA: Diagnosis not present

## 2022-03-26 LAB — RENAL FUNCTION PANEL
Albumin: 3.5 g/dL (ref 3.5–5.0)
Anion gap: 7 (ref 5–15)
BUN: 10 mg/dL (ref 8–23)
CO2: 24 mmol/L (ref 22–32)
Calcium: 8.7 mg/dL — ABNORMAL LOW (ref 8.9–10.3)
Chloride: 107 mmol/L (ref 98–111)
Creatinine, Ser: 0.94 mg/dL (ref 0.44–1.00)
GFR, Estimated: >60 mL/min (ref >60)
Glucose, Bld: 112 mg/dL — ABNORMAL HIGH (ref 70–99)
Phosphorus: 3.4 mg/dL (ref 2.5–4.6)
Potassium: 3.7 mmol/L (ref 3.5–5.1)
Sodium: 138 mmol/L (ref 135–145)

## 2022-03-26 LAB — MAGNESIUM: Magnesium: 1.7 mg/dL (ref 1.7–2.4)

## 2022-03-26 MED ORDER — MAGNESIUM OXIDE -MG SUPPLEMENT 400 (240 MG) MG PO TABS
400.0000 mg | ORAL_TABLET | Freq: Two times a day (BID) | ORAL | Status: AC
  Administered 2022-03-26 – 2022-03-27 (×4): 400 mg via ORAL
  Filled 2022-03-26 (×4): qty 1

## 2022-03-26 NOTE — Progress Notes (Signed)
PROGRESS NOTE     Stacy Dalton, is a 71 y.o. female, DOB - Jun 30, 1951, PYK:[id number]  Admit date - 03/24/2022   Admitting Physician Truett Mainland, DO  Outpatient Primary MD for the patient is Pcp, No  LOS - 2  Chief Complaint  Patient presents with   Weakness        Brief Narrative:  71 y.o. female with medical history significant of hypertension, COPD, alcohol dependence, hypothyroidism, osteopenia admitted on 03/24/2022 with alcohol withdrawal   -Assessment and Plan:  1)DTs---  patient admits she drinks more than 1 L of vodka each day Continues to require oral and IV benzos -c/n Benzos and multivitamin  2)Hypokalemia/hypomagnesemia in an alcoholic female----replace and recheck  3)HTN--continue amlodipine, losartan and metoprolol -May use IV hydralazine as needed elevated BP  4) COPD--no acute exacerbation at this time -use  as needed bronchodilators  5) hypothyroidism--- continue levothyroxine   Disposition/Need for in-Hospital Stay- patient unable to be discharged at this time due to --alcohol withdrawal/DTs requiring IV benzos and close observation*  Status is: Inpatient   Disposition: The patient is from: Home              Anticipated d/c is to: Home              Anticipated d/c date is: 2 days              Patient currently is not medically stable to d/c. Barriers: Not Clinically Stable-   Code Status :  -  Code Status: Full Code   Family Communication:    NA (patient is alert, awake and coherent)   DVT Prophylaxis  :   - SCDs  enoxaparin (LOVENOX) injection 40 mg Start: 03/24/22 1800   Lab Results  Component Value Date   PLT 143 (L) 03/25/2022    Inpatient Medications  Scheduled Meds:  amLODipine  10 mg Oral Daily   diazepam  2 mg Oral TID   diazepam  5 mg Oral Once   enoxaparin (LOVENOX) injection  40 mg Subcutaneous N39J   folic acid  1 mg Oral Daily   levothyroxine  50 mcg Oral Q0600   LORazepam  0-4 mg Oral Q12H   losartan  50 mg  Oral Daily   magnesium oxide  400 mg Oral BID   metoprolol tartrate  25 mg Oral BID   multivitamin with minerals  1 tablet Oral Daily   potassium chloride SA  20 mEq Oral Daily   thiamine  100 mg Oral Daily   Or   thiamine  100 mg Intravenous Daily   Continuous Infusions: PRN Meds:.hydrALAZINE, LORazepam **OR** LORazepam, ondansetron **OR** ondansetron (ZOFRAN) IV, mouth rinse   Anti-infectives (From admission, onward)    None       Subjective: Stacy Dalton today has no fevers, no emesis,  No chest pain,   -Patient is restless, anxious -Mostly cooperative   Objective: Vitals:   03/25/22 2330 03/26/22 0546 03/26/22 0800 03/26/22 0908  BP: (!) 155/80 (!) 147/88 (!) 146/90   Pulse: 90 85 93   Resp: 16 18    Temp:  (!) 97.4 F (36.3 C)  97.6 F (36.4 C)  TempSrc:  Oral  Oral  SpO2: 96% 95%    Weight:      Height:        Intake/Output Summary (Last 24 hours) at 03/26/2022 1858 Last data filed at 03/25/2022 2300 Gross per 24 hour  Intake 240 ml  Output --  Net 240  ml   Filed Weights   03/24/22 0948  Weight: 56.7 kg    Physical Exam  Gen:- Awake Alert, anxious HEENT:- [place].AT, No sclera icterus Neck-Supple Neck,No JVD,.  Lungs-  CTAB , fair symmetrical air movement CV- S1, S2 normal, regular  Abd-  +ve B.Sounds, Abd Soft, No tenderness,    Extremity/Skin:- No  edema, pedal pulses present  Psych-affect is anxious, oriented x3 Neuro-no new focal deficits, significant tremors  Data Reviewed: I have personally reviewed following labs and imaging studies  CBC: Recent Labs  Lab 03/24/22 1012 03/25/22 0603  WBC 3.9* 5.0  HGB 11.2* 10.4*  HCT 34.8* 32.9*  MCV 79.1* 80.6  PLT 207 916*   Basic Metabolic Panel: Recent Labs  Lab 03/24/22 1012 03/24/22 1340 03/25/22 0603 03/25/22 2153 03/26/22 0511 03/26/22 0512  NA 141  --  135  --  138  --   K 3.1*  --  2.9*  --  3.7  --   CL 102  --  102  --  107  --   CO2 22  --  24  --  24  --   GLUCOSE 105*   --  129*  --  112*  --   BUN 15  --  8  --  10  --   CREATININE 0.74  --  0.68  --  0.94  --   CALCIUM 8.9  --  8.4*  --  8.7*  --   MG  --  1.4*  --  1.6*  --  1.7  PHOS  --   --   --   --  3.4  --    GFR: Estimated Creatinine Clearance: 42.2 mL/min (by C-G formula based on SCr of 0.94 mg/dL). Recent Results (from the past 240 hour(s))  Urine Culture     Status: Abnormal   Collection Time: 03/18/22  6:20 PM   Specimen: Urine, Clean Catch  Result Value Ref Range Status   Specimen Description   Final    URINE, CLEAN CATCH Performed at [facility], [address], [place], [place] [address]    Special Requests   Final    NONE Performed at [facility], [address] [place], [place] [address]    Culture MULTIPLE SPECIES PRESENT, SUGGEST RECOLLECTION (A)  Final   Report Status 03/20/2022 FINAL  Final     Radiology Studies: No results found.   Scheduled Meds:  amLODipine  10 mg Oral Daily   diazepam  2 mg Oral TID   diazepam  5 mg Oral Once   enoxaparin (LOVENOX) injection  40 mg Subcutaneous M03K   folic acid  1 mg Oral Daily   levothyroxine  50 mcg Oral Q0600   LORazepam  0-4 mg Oral Q12H   losartan  50 mg Oral Daily   magnesium oxide  400 mg Oral BID   metoprolol tartrate  25 mg Oral BID   multivitamin with minerals  1 tablet Oral Daily   potassium chloride SA  20 mEq Oral Daily   thiamine  100 mg Oral Daily   Or   thiamine  100 mg Intravenous Daily   Continuous Infusions:   LOS: 2 days    Roxan Hockey M.D on 03/26/2022 at 6:58 PM  Go to [email or web address] - for contact info  [facility] - Office  [phone]  If 7PM-7AM, please contact night-coverage [email or web address] 03/26/2022, 6:58 PM

## 2022-03-27 DIAGNOSIS — E038 Other specified hypothyroidism: Secondary | ICD-10-CM

## 2022-03-27 DIAGNOSIS — I10 Essential (primary) hypertension: Secondary | ICD-10-CM | POA: Diagnosis not present

## 2022-03-27 DIAGNOSIS — F10931 Alcohol use, unspecified with withdrawal delirium: Secondary | ICD-10-CM | POA: Diagnosis not present

## 2022-03-27 DIAGNOSIS — J449 Chronic obstructive pulmonary disease, unspecified: Secondary | ICD-10-CM | POA: Diagnosis not present

## 2022-03-27 MED ORDER — HYDROXYZINE HCL 25 MG PO TABS
50.0000 mg | ORAL_TABLET | Freq: Once | ORAL | Status: AC
  Administered 2022-03-27: 50 mg via ORAL
  Filled 2022-03-27: qty 2

## 2022-03-27 MED ORDER — DIAZEPAM 2 MG PO TABS
2.0000 mg | ORAL_TABLET | Freq: Three times a day (TID) | ORAL | Status: AC
  Administered 2022-03-27 – 2022-03-28 (×3): 2 mg via ORAL
  Filled 2022-03-27 (×3): qty 1

## 2022-03-27 NOTE — TOC Initial Note (Addendum)
Transition of Care [facility]) - Initial/Assessment Note    Patient Details  Name: Stacy Dalton MRN: [id number] Date of Birth: December 11, 1950  [facility] of [facility] CM/SW Contact:    Iona Beard, [place] Phone Number: 03/27/2022, 10:42 AM  Clinical Narrative:                 [facility] consulted for substance use resources. Pt also high risk for readmission. CSW spoke with pt to complete assessment. Pt states that she and her boyfriend stay together. Pt is able to complete ADLs independently. Pts boyfriend is able to provide transportation when needed. Pt states that she has had HH in the past but not currently, she feels as if this would good. CSW to work on finding [facility] for pt. Pt states that she has a walker to use when needed. CSW inquired about pts interest in substance use resources. Pt sates that she is interested. Resources will be provided to pt in room. TOC to follow.   Expected Discharge Plan: [place] Barriers to Discharge: Continued Medical Work up   Patient Goals and CMS Choice Patient states their goals for this hospitalization and ongoing recovery are:: return home CMS Medicare.gov Compare Post Acute Care list provided to:: Patient Choice offered to / list presented to : Patient  Expected Discharge Plan and Services Expected Discharge Plan: [place] In-house Referral: Clinical Social Work Discharge Planning Services: CM Consult Post Acute Care Choice: [place] arrangements for the past 2 months: Single Family Home                                      Prior Living Arrangements/Services Living arrangements for the past 2 months: Single Family Home Lives with:: Significant Other Patient language and need for interpreter reviewed:: Yes Do you feel safe going back to the place where you live?: Yes      Need for Family Participation in Patient Care: Yes (Comment) Care giver support system in place?: Yes (comment) Current  home services: DME (walker) Criminal Activity/Legal Involvement Pertinent to Current Situation/Hospitalization: No - Comment as needed  Activities of Daily Living Home Assistive Devices/Equipment: Environmental consultant (specify type), Cane (specify quad or straight) ADL Screening (condition at time of admission) Patient's cognitive ability adequate to safely complete daily activities?: No Is the patient deaf or have difficulty hearing?: No Does the patient have difficulty seeing, even when wearing glasses/contacts?: No Does the patient have difficulty concentrating, remembering, or making decisions?: Yes Patient able to express need for assistance with ADLs?: Yes Does the patient have difficulty dressing or bathing?: No Independently performs ADLs?: Yes (appropriate for developmental age) Does the patient have difficulty walking or climbing stairs?: Yes Weakness of Legs: Both Weakness of Arms/Hands: None  Permission Sought/Granted                  Emotional Assessment Appearance:: Appears stated age Attitude/Demeanor/Rapport: Engaged Affect (typically observed): Accepting Orientation: : Oriented to Self, Oriented to Place, Oriented to  Time, Oriented to Situation Alcohol / Substance Use: Not Applicable Psych Involvement: No (comment)  Admission diagnosis:  Alcohol withdrawal ([place]) [F10.939] Hypokalemia [E87.6] Hypomagnesemia [E83.42] Chronic alcohol dependence, continuous ([place]) [F10.20] Alcohol withdrawal syndrome without complication (HCC) [P38.250] Patient Active Problem List   Diagnosis Date Noted   GERD (gastroesophageal reflux disease) 10/10/2021   Intractable nausea and vomiting 53/97/6734   Alcoholic ketoacidosis 19/37/9024  Hypertensive urgency 08/27/2021   Esophagitis 08/27/2021   Atypical chest pain 08/27/2021   Seizure-like activity ([place]) 06/06/2021   DTs (delirium tremens) ([place]) 06/06/2021   AKI (acute kidney injury) ([place]) 05/19/2021   Elevated LFTs 81/82/9937    Metabolic acidosis 16/96/7893   COVID-19 virus infection 04/15/2021    Class: Acute   Hypotensive episode 04/15/2021    Class: Acute   Physical deconditioning    Vitamin B12 deficiency 02/04/2021   Leukocytosis 11/23/2020   Alcohol use disorder, severe, dependence ([place]) 10/08/2020   Hyponatremia 09/17/2020   Alcohol withdrawal ([place]) 09/12/2020   Moderate protein malnutrition ([place]) 08/20/2020   Elevated SGOT ([place]) 08/19/2020   Elevated troponin 08/19/2020   Increased serum lipase level 08/19/2020   Alcohol abuse 06/21/2020   Acute respiratory failure with hypoxia ([place]) 09/19/2018   Large bowel obstruction (HCC)    Small bowel obstruction ([place]) 07/20/2018   Hypomagnesemia 07/20/2018   Polypharmacy 02/23/2017   Hypokalemia 81/07/7508   Acute metabolic encephalopathy 25/85/2778   Anxiety 02/22/2017   Altered mental status    Pain medication agreement signed 10/10/2016   Opioid dependence ([place]) 10/10/2016   Status post colostomy takedown 04/12/2016   Chronic constipation 12/03/2015   Peritonitis with abscess of intestine ([place]) 11/29/2015   COPD exacerbation ([place]) 09/20/2015   Osteopenia 06/10/2014   Vitamin D deficiency 06/10/2014   Hypothyroidism    Depression 11/18/2012   Insomnia 11/18/2012   Chronic pain syndrome 11/18/2012   HLD (hyperlipidemia) 09/01/2012   S/P colostomy ([place]) 04/19/2012   Normocytic anemia 10/15/2011   Hypertension 10/13/2011   Chronic back pain 10/13/2011   COPD (chronic obstructive pulmonary disease) ([place]) 10/09/2011   PCP:  Pcp, No Pharmacy:   [facility] [place], [place] [address] [place] [address] [place] [address] [place] [address] Phone: [phone] Fax: [phone]     Social Determinants of Health (SDOH) Interventions Housing Interventions: Intervention Not Indicated  Readmission Risk Interventions    03/27/2022   10:41 AM 10/10/2021    9:08 AM 09/19/2021    2:18 PM   Readmission Risk Prevention Plan  Transportation Screening Complete Complete Complete  Medication Review Press photographer) Complete Complete Complete  PCP or Specialist appointment within 3-5 days of discharge   Not Complete  HRI or [place] Complete Complete Complete  SW Recovery Care/Counseling Consult Complete Complete   Palliative Care Screening Not Applicable Not [place] Not Applicable Not Applicable

## 2022-03-27 NOTE — Progress Notes (Signed)
PROGRESS NOTE     Stacy Dalton, is a 71 y.o. female, DOB - 09-01-1950, [id number]  Admit date - 03/24/2022   Admitting Physician Truett Mainland, DO  Outpatient Primary MD for the patient is Pcp, No  LOS - 3  Chief Complaint  Patient presents with   Weakness        Brief Narrative:  71 y.o. female with medical history significant of hypertension, COPD, alcohol dependence, hypothyroidism, osteopenia admitted on 03/24/2022 with alcohol withdrawal   -Assessment and Plan:  1)DTs---  patient admits she drinks more than 1 L of vodka each day -BP and heart rate somewhat up -Anxiety, restlessness and tremors improving slowly Continues to require oral and IV benzos -c/n Benzos and multivitamin  2)Hypokalemia/hypomagnesemia in an alcoholic female----replace and recheck  3)HTN--BP is not at goal  continue amlodipine, losartan and metoprolol -May use IV hydralazine as needed elevated BP  4) COPD--no acute exacerbation at this time -use  as needed bronchodilators  5) hypothyroidism--- continue levothyroxine  6) chronic back pain/generalized weakness --- be judicious with opiates, follow-up with Ortho/spine specialist or pain clinic postdischarge -May benefit from home health physical therapy at discharge   Disposition/Need for in-Hospital Stay- patient unable to be discharged at this time due to --alcohol withdrawal/DTs requiring IV benzos and close observation* -BP and heart rate somewhat up -Anxiety, restlessness and tremors improving slowly -Possible discharge home on 03/29/2019 if continues to improve  Status is: Inpatient   Disposition: The patient is from: Home              Anticipated d/c is to: Home              Anticipated d/c date is: 1 day              Patient currently is not medically stable to d/c. Barriers: Not Clinically Stable-   Code Status :  -  Code Status: Full Code   Family Communication:    NA (patient is alert, awake and coherent)   DVT  Prophylaxis  :   - SCDs  enoxaparin (LOVENOX) injection 40 mg Start: 03/24/22 1800   Lab Results  Component Value Date   PLT 143 (L) 03/25/2022    Inpatient Medications  Scheduled Meds:  amLODipine  10 mg Oral Daily   diazepam  2 mg Oral TID   diazepam  5 mg Oral Once   enoxaparin (LOVENOX) injection  40 mg Subcutaneous U72Z   folic acid  1 mg Oral Daily   levothyroxine  50 mcg Oral Q0600   LORazepam  0-4 mg Oral Q12H   losartan  50 mg Oral Daily   magnesium oxide  400 mg Oral BID   metoprolol tartrate  25 mg Oral BID   multivitamin with minerals  1 tablet Oral Daily   potassium chloride SA  20 mEq Oral Daily   thiamine  100 mg Oral Daily   Or   thiamine  100 mg Intravenous Daily   Continuous Infusions: PRN Meds:.hydrALAZINE, LORazepam **OR** LORazepam, ondansetron **OR** ondansetron (ZOFRAN) IV, mouth rinse   Anti-infectives (From admission, onward)    None       Subjective: Stacy Dalton today has no fevers, no emesis,  No chest pain,   - Tremors and anxiety and restlessness persist -Overall cooperative -Complains of chronic back pain -BP and heart rate somewhat up   Objective: Vitals:   03/26/22 0908 03/26/22 2046 03/27/22 0019 03/27/22 0426  BP:  136/73 (!) 147/83 (!) 156/88  Pulse:  94 80 89  Resp:  18 18   Temp: 97.6 F (36.4 C) 98.3 F (36.8 C) (!) 97.3 F (36.3 C)   TempSrc: Oral Oral    SpO2:  97% 93%   Weight:      Height:        Intake/Output Summary (Last 24 hours) at 03/27/2022 1129 Last data filed at 03/27/2022 0645 Gross per 24 hour  Intake 240 ml  Output --  Net 240 ml   Filed Weights   03/24/22 0948  Weight: 56.7 kg    Physical Exam  Gen:- Awake Alert, anxious HEENT:- [place].AT, No sclera icterus Neck-Supple Neck,No JVD,.  Lungs-  CTAB , fair symmetrical air movement CV- S1, S2 normal, regular  Abd-  +ve B.Sounds, Abd Soft, No tenderness,    Extremity/Skin:- No  edema, pedal pulses present  Psych-affect is anxious,  oriented x3 Neuro-no new focal deficits, significant tremors  Data Reviewed: I have personally reviewed following labs and imaging studies  CBC: Recent Labs  Lab 03/24/22 1012 03/25/22 0603  WBC 3.9* 5.0  HGB 11.2* 10.4*  HCT 34.8* 32.9*  MCV 79.1* 80.6  PLT 207 902*   Basic Metabolic Panel: Recent Labs  Lab 03/24/22 1012 03/24/22 1340 03/25/22 0603 03/25/22 2153 03/26/22 0511 03/26/22 0512  NA 141  --  135  --  138  --   K 3.1*  --  2.9*  --  3.7  --   CL 102  --  102  --  107  --   CO2 22  --  24  --  24  --   GLUCOSE 105*  --  129*  --  112*  --   BUN 15  --  8  --  10  --   CREATININE 0.74  --  0.68  --  0.94  --   CALCIUM 8.9  --  8.4*  --  8.7*  --   MG  --  1.4*  --  1.6*  --  1.7  PHOS  --   --   --   --  3.4  --    GFR: Estimated Creatinine Clearance: 42.2 mL/min (by C-G formula based on SCr of 0.94 mg/dL). Recent Results (from the past 240 hour(s))  Urine Culture     Status: Abnormal   Collection Time: 03/18/22  6:20 PM   Specimen: Urine, Clean Catch  Result Value Ref Range Status   Specimen Description   Final    URINE, CLEAN CATCH Performed at [facility], [address], [place], [place] [address]    Special Requests   Final    NONE Performed at [facility], [address], [place], [place] [address]    Culture MULTIPLE SPECIES PRESENT, SUGGEST RECOLLECTION (A)  Final   Report Status 03/20/2022 FINAL  Final     Radiology Studies: No results found.  Scheduled Meds:  amLODipine  10 mg Oral Daily   diazepam  2 mg Oral TID   diazepam  5 mg Oral Once   enoxaparin (LOVENOX) injection  40 mg Subcutaneous M42A   folic acid  1 mg Oral Daily   levothyroxine  50 mcg Oral Q0600   LORazepam  0-4 mg Oral Q12H   losartan  50 mg Oral Daily   magnesium oxide  400 mg Oral BID   metoprolol tartrate  25 mg Oral BID   multivitamin with minerals  1 tablet Oral Daily   potassium chloride SA  20 mEq  Oral Daily   thiamine  100 mg Oral Daily   Or   thiamine   100 mg Intravenous Daily   Continuous Infusions:   LOS: 3 days   Roxan Hockey M.D on 03/27/2022 at 11:29 AM  Go to [email or web address] - for contact info  [facility] - Office  [phone]  If 7PM-7AM, please contact night-coverage [email or web address] 03/27/2022, 11:29 AM

## 2022-03-28 DIAGNOSIS — I10 Essential (primary) hypertension: Secondary | ICD-10-CM | POA: Diagnosis not present

## 2022-03-28 DIAGNOSIS — F10931 Alcohol use, unspecified with withdrawal delirium: Secondary | ICD-10-CM | POA: Diagnosis not present

## 2022-03-28 DIAGNOSIS — E039 Hypothyroidism, unspecified: Secondary | ICD-10-CM

## 2022-03-28 LAB — GLUCOSE, CAPILLARY: Glucose-Capillary: 108 mg/dL — ABNORMAL HIGH (ref 70–99)

## 2022-03-28 MED ORDER — FOLIC ACID 1 MG PO TABS: 1.0000 mg | ORAL_TABLET | Freq: Every day | ORAL | 5 refills | Status: DC

## 2022-03-28 MED ORDER — DULOXETINE HCL 60 MG PO CPEP: 60.0000 mg | ORAL_CAPSULE | Freq: Every day | ORAL | 2 refills | Status: DC

## 2022-03-28 MED ORDER — ADULT MULTIVITAMIN W/MINERALS CH: 1.0000 | ORAL_TABLET | Freq: Every day | ORAL | 5 refills | Status: DC

## 2022-03-28 MED ORDER — LEVOTHYROXINE SODIUM 50 MCG PO TABS: 50.0000 ug | ORAL_TABLET | Freq: Every day | ORAL | 2 refills | Status: AC

## 2022-03-28 MED ORDER — ONDANSETRON HCL 4 MG PO TABS: 4.0000 mg | ORAL_TABLET | Freq: Four times a day (QID) | ORAL | 0 refills | Status: DC

## 2022-03-28 MED ORDER — ALBUTEROL SULFATE HFA 108 (90 BASE) MCG/ACT IN AERS: 2.0000 | INHALATION_SPRAY | RESPIRATORY_TRACT | 2 refills | Status: AC | PRN

## 2022-03-28 MED ORDER — TRAZODONE HCL 100 MG PO TABS: 100.0000 mg | ORAL_TABLET | Freq: Every day | ORAL | 1 refills | Status: DC

## 2022-03-28 MED ORDER — AMLODIPINE BESYLATE 10 MG PO TABS: 10.0000 mg | ORAL_TABLET | Freq: Every day | ORAL | 3 refills | Status: DC

## 2022-03-28 MED ORDER — METOPROLOL TARTRATE 25 MG PO TABS: 25.0000 mg | ORAL_TABLET | Freq: Two times a day (BID) | ORAL | 5 refills | Status: DC

## 2022-03-28 MED ORDER — THIAMINE HCL 100 MG PO TABS: 100.0000 mg | ORAL_TABLET | Freq: Every day | ORAL | 5 refills | Status: DC

## 2022-03-28 MED ORDER — HYDROXYZINE PAMOATE 25 MG PO CAPS: 25.0000 mg | ORAL_CAPSULE | ORAL | 2 refills | Status: DC | PRN

## 2022-03-28 MED ORDER — PANTOPRAZOLE SODIUM 40 MG PO TBEC: 40.0000 mg | DELAYED_RELEASE_TABLET | Freq: Two times a day (BID) | ORAL | 2 refills | Status: DC

## 2022-03-28 MED ORDER — MAGNESIUM OXIDE -MG SUPPLEMENT 400 (240 MG) MG PO TABS: 400.0000 mg | ORAL_TABLET | Freq: Every day | ORAL | 5 refills | Status: DC

## 2022-03-28 MED ORDER — LOSARTAN POTASSIUM 50 MG PO TABS: 50.0000 mg | ORAL_TABLET | Freq: Every day | ORAL | 3 refills | Status: DC

## 2022-03-28 MED ORDER — MAGNESIUM OXIDE -MG SUPPLEMENT 400 (240 MG) MG PO TABS
400.0000 mg | ORAL_TABLET | Freq: Two times a day (BID) | ORAL | Status: DC
  Administered 2022-03-28: 400 mg via ORAL
  Filled 2022-03-28: qty 1

## 2022-03-28 MED ORDER — SUCRALFATE 1 GM/10ML PO SUSP: 1.0000 g | Freq: Three times a day (TID) | ORAL | 1 refills | Status: DC

## 2022-03-28 MED ORDER — PREDNISONE 20 MG PO TABS: 40.0000 mg | ORAL_TABLET | Freq: Every day | ORAL | 0 refills | Status: AC

## 2022-03-28 MED ORDER — TRELEGY ELLIPTA 200-62.5-25 MCG/ACT IN AEPB: 1.0000 | INHALATION_SPRAY | Freq: Every day | RESPIRATORY_TRACT | 3 refills | Status: DC

## 2022-03-28 MED ORDER — VITAMIN B-12 1000 MCG PO TABS: 1000.0000 ug | ORAL_TABLET | Freq: Every day | ORAL | 5 refills | Status: DC

## 2022-03-28 NOTE — Discharge Summary (Addendum)
Stacy Dalton, is a 71 y.o. female  DOB Jan 28, 1951  MRN [id number].  Admission date:  03/24/2022  Admitting Physician  No admitting provider for patient encounter.  Discharge Date:  03/28/2022   Primary MD  Pcp, No  Recommendations for primary care physician for things to follow:   1) outpatient or inpatient alcohol rehab program strongly advised--- 2) complete abstinence from alcohol strongly advised 3)Avoid ibuprofen/Advil/Aleve/Motrin/Goody Powders/Naproxen/BC powders/Meloxicam/Diclofenac/Indomethacin and other Nonsteroidal anti-inflammatory medications as these will make you more likely to bleed and can cause stomach ulcers, can also cause Kidney problems.  4)Repeat CBC and BMP with your primary care physician in 1 to 2 weeks 5) abstinence from tobacco advised 6) follow-up with orthopedic surgeon/spine specialist for your chronic back pain, you may also consider going to pain clinic for pain management  Admission Diagnosis  Alcohol withdrawal ([place]) [F10.939] Hypokalemia [E87.6] Hypomagnesemia [E83.42] Chronic alcohol dependence, continuous ([place]) [F10.20] Alcohol withdrawal syndrome without complication ([place]) [L27.517]   Discharge Diagnosis  Alcohol withdrawal ([place]) [F10.939] Hypokalemia [E87.6] Hypomagnesemia [E83.42] Chronic alcohol dependence, continuous ([place]) [F10.20] Alcohol withdrawal syndrome without complication ([place]) [G01.749]    Principal Problem:   Alcohol withdrawal ([place]) Active Problems:   COPD (chronic obstructive pulmonary disease) ([place])   Hypertension   Hypothyroidism   Hypokalemia   Hypertensive urgency      Past Medical History:  Diagnosis Date   Chronic pain    COPD (chronic obstructive pulmonary disease) ([place])    told has copd, no current inhaler use   Cough    Depression    ETOH abuse    GERD (gastroesophageal reflux disease)    Hypertension    Hypothyroidism     Insomnia    Migraine    Migraine    Osteopenia    Pain management    Panic attacks    Small bowel obstruction ([place])     Past Surgical History:  Procedure Laterality Date   ABDOMINAL HYSTERECTOMY     BIOPSY  08/29/2021   Procedure: BIOPSY;  Surgeon: [name], DO;  [facility];  Service: Endoscopy;;   COLON SURGERY     COLOSTOMY CLOSURE  04/19/2012   Procedure: COLOSTOMY CLOSURE;  Surgeon: Adin Hector, MD;  Location: [facility];  Service: General;  Laterality: N/A;  Laparotomy, Resection and Closure of Colostomy   COLOSTOMY TAKEDOWN N/A 04/12/2016   Procedure: [place] TAKEDOWN;  Surgeon: Johnathan Hausen, MD;  [facility]: [facility];  Service: General;  Laterality: N/A;   ESOPHAGOGASTRODUODENOSCOPY (EGD) WITH PROPOFOL N/A 08/29/2021   Procedure: ESOPHAGOGASTRODUODENOSCOPY (EGD) WITH PROPOFOL;  Surgeon: [name], DO;  [facility];  Service: Endoscopy;  Laterality: N/A;   INCONTINENCE SURGERY     LAPAROTOMY  10/04/2011, colostomy also   Procedure: EXPLORATORY LAPAROTOMY;  Surgeon: Adin Hector, MD;  [facility];  Service: General;  Laterality: N/A;  left partial colectomy with colostomy   LAPAROTOMY  04/19/2012   Procedure: EXPLORATORY LAPAROTOMY;  Surgeon: Adin Hector, MD;  [facility];  Service: General;  Laterality: N/A;   LAPAROTOMY N/A 11/29/2015   Procedure: EXPLORATORY LAPAROTOMY; SUBTOTAL COLECTOMY WITH HARTMAN PROCEDURE AND END COLOSTOMY;  Surgeon: Johnathan Hausen, MD;  [facility];  Service: General;  Laterality: N/A;   TUBAL LIGATION     VENTRAL HERNIA REPAIR  04/19/2012   Procedure: HERNIA REPAIR VENTRAL ADULT;  Surgeon: Adin Hector, MD;  [facility];  Service: General;  Laterality: N/A;       HPI  from the history and physical done on the day of admission:     HPI: Stacy Dalton is a 71 y.o. female with medical history significant of hypertension, COPD, alcohol dependence, hypothyroidism, osteopenia.   Patient seen for feeling globally weak and confused.  She reports no fevers, chills, nausea, vomiting.  She denies headache, blurred vision, focal weakness.   She normally drinks half a bottle to 1/5 of vodka on a daily basis.  She has tried to reduce alcohol consumption in the past.  She currently drinks alcohol due to chronic pain.   Review of Systems: As mentioned in the history of present illness. All other systems reviewed and are negative.    Hospital Course:    Brief Narrative:  71 y.o. female with medical history significant of hypertension, COPD, alcohol dependence, hypothyroidism, osteopenia admitted on 03/24/2022 with alcohol withdrawal   -Assessment and Plan:   1)DTs---  patient admits she drinks more than 1 L of vodka each day -Treated with benzos, DT symptoms have resolved   2)Hypokalemia/hypomagnesemia in an alcoholic female----replaced and normalized    3)HTN--BP improved with resolution of DTs symptoms continue amlodipine, losartan and metoprolol   4) COPD--no acute exacerbation at this time = Is on bronchodilators as ordered   5) hypothyroidism--- continue levothyroxine   6) chronic back pain/generalized weakness --- be judicious with opiates, follow-up with Ortho/spine specialist or pain clinic postdischarge -May benefit from home health physical therapy at discharge    Disposition: The patient is from: Home              Anticipated d/c is to: Home               Discharge Condition: stable  Follow UP---PCP  Diet and Activity recommendation:  As advised  Discharge Instructions    Discharge Instructions     Call MD for:  persistant dizziness or light-headedness   Complete by: As directed    Call MD for:  persistant nausea and vomiting   Complete by: As directed    Call MD for:  redness, tenderness, or signs of infection (pain, swelling, redness, odor or green/yellow discharge around incision site)   Complete by: As directed    Call MD for:   temperature >100.4   Complete by: As directed    Diet - low sodium heart healthy   Complete by: As directed    Discharge instructions   Complete by: As directed    1) outpatient or inpatient alcohol rehab program strongly advised--- 2) complete abstinence from alcohol strongly advised 3)Avoid ibuprofen/Advil/Aleve/Motrin/Goody Powders/Naproxen/BC powders/Meloxicam/Diclofenac/Indomethacin and other Nonsteroidal anti-inflammatory medications as these will make you more likely to bleed and can cause stomach ulcers, can also cause Kidney problems.  4)Repeat CBC and BMP with your primary care physician in 1 to 2 weeks 5) abstinence from tobacco advised 6) follow-up with orthopedic surgeon/spine specialist for your chronic back pain, you may also consider going to pain clinic for pain management   Increase activity slowly   Complete by: As directed  Discharge Medications     Allergies as of 03/28/2022       Reactions   Librium [chlordiazepoxide]    Became unresponsive   Toradol [ketorolac Tromethamine] Shortness Of Breath   Butalbital-apap-caffeine Other (See Comments)   jittery   Demerol Swelling   Esgic [butalbital-apap-caffeine] Other (See Comments)   jittery   Iodine Other (See Comments)   bp bottomed out per pt several hours later; unsure if pre medicated in past with cm; [facility]   Limonene    Other reaction(s): Other (See Comments) jittery   Meperidine Swelling   Pyridium [phenazopyridine] Nausea Only        Medication List     STOP taking these medications    cephALEXin 500 MG capsule Commonly known as: KEFLEX   cyclobenzaprine 10 MG tablet Commonly known as: FLEXERIL   dicyclomine 20 MG tablet Commonly known as: BENTYL   famotidine 20 MG tablet Commonly known as: PEPCID   potassium chloride SA 20 MEQ tablet Commonly known as: KLOR-CON M       TAKE these medications    albuterol 108 (90 Base) MCG/ACT inhaler Commonly known as:  VENTOLIN HFA Inhale 2 puffs into the lungs every 4 (four) hours as needed for wheezing or shortness of breath.   amLODipine 10 MG tablet Commonly known as: NORVASC Take 1 tablet (10 mg total) by mouth daily.   cyanocobalamin 1000 MCG tablet Commonly known as: VITAMIN B12 Take 1 tablet (1,000 mcg total) by mouth daily.   DULoxetine 60 MG capsule Commonly known as: Cymbalta Take 1 capsule (60 mg total) by mouth daily.   folic acid 1 MG tablet Commonly known as: FOLVITE Take 1 tablet (1 mg total) by mouth daily.   hydrOXYzine 25 MG capsule Commonly known as: VISTARIL Take 1 capsule (25 mg total) by mouth every 4 (four) hours as needed for anxiety or nausea. What changed: reasons to take this   levothyroxine 50 MCG tablet Commonly known as: Synthroid Take 1 tablet (50 mcg total) by mouth daily before breakfast.   losartan 50 MG tablet Commonly known as: COZAAR Take 1 tablet (50 mg total) by mouth daily.   magnesium oxide 400 (240 Mg) MG tablet Commonly known as: MAG-OX Take 1 tablet (400 mg total) by mouth daily.   metoprolol tartrate 25 MG tablet Commonly known as: LOPRESSOR Take 1 tablet (25 mg total) by mouth 2 (two) times daily.   multivitamin with minerals Tabs tablet Take 1 tablet by mouth daily.   ondansetron 4 MG tablet Commonly known as: ZOFRAN Take 1 tablet (4 mg total) by mouth every 6 (six) hours.   pantoprazole 40 MG tablet Commonly known as: Protonix Take 1 tablet (40 mg total) by mouth 2 (two) times daily.   predniSONE 20 MG tablet Commonly known as: DELTASONE Take 2 tablets (40 mg total) by mouth daily with breakfast for 5 days.   sucralfate 1 GM/10ML suspension Commonly known as: CARAFATE Take 10 mLs (1 g total) by mouth 4 (four) times daily -  with meals and at bedtime.   thiamine 100 MG tablet Commonly known as: VITAMIN B1 Take 1 tablet (100 mg total) by mouth daily.   traZODone 100 MG tablet Commonly known as: DESYREL Take 1 tablet (100  mg total) by mouth at bedtime.   Trelegy Ellipta 200-62.5-25 MCG/ACT Aepb Generic drug: Fluticasone-Umeclidin-Vilant Inhale 1 puff into the lungs daily.        Major procedures and Radiology Reports - PLEASE review  detailed and final reports for all details, in brief -   CT Head Wo Contrast  Result Date: 03/24/2022 CLINICAL DATA:  Mental status change, unknown cause EXAM: CT HEAD WITHOUT CONTRAST TECHNIQUE: Contiguous axial images were obtained from the base of the skull through the vertex without intravenous contrast. RADIATION DOSE REDUCTION: This exam was performed according to the departmental dose-optimization program which includes automated exposure control, adjustment of the mA and/or kV according to patient size and/or use of iterative reconstruction technique. COMPARISON:  10/18/2021 FINDINGS: Brain: No acute intracranial abnormality. Specifically, no hemorrhage, hydrocephalus, mass lesion, acute infarction, or significant intracranial injury. Vascular: No hyperdense vessel or unexpected calcification. Skull: No acute calvarial abnormality. Sinuses/Orbits: No acute findings Other: None IMPRESSION: No acute intracranial abnormality. Electronically Signed   By: Rolm Baptise M.D.   On: 03/24/2022 11:12   DG Abdomen Acute W/Chest  Result Date: 03/18/2022 CLINICAL DATA:  Abdominal pain. EXAM: DG ABDOMEN ACUTE WITH 1 VIEW CHEST COMPARISON:  Chest x-ray dated December 17, 2021. Abdominal x-ray dated June 22, 2021. FINDINGS: There is no evidence of dilated bowel loops or free intraperitoneal air. No radiopaque calculi or other significant radiographic abnormality is seen. Heart size and mediastinal contours are within normal limits. Both lungs are clear. IMPRESSION: Negative abdominal radiographs.  No acute cardiopulmonary disease. Electronically Signed   By: Titus Dubin M.D.   On: 03/18/2022 16:05    Micro Results  Recent Results (from the past 240 hour(s))  Urine Culture     Status:  Abnormal   Collection Time: 03/18/22  6:20 PM   Specimen: Urine, Clean Catch  Result Value Ref Range Status   Specimen Description   Final    URINE, CLEAN CATCH Performed at [facility], [address], [place], [place] [address]    Special Requests   Final    NONE Performed at [facility], [address] [place], [place] [address]    Culture [place], SUGGEST RECOLLECTION (A)  Final   Report Status 03/20/2022 FINAL  Final    Today   Subjective    Stacy Dalton today has no new concerns  No fever  Or chills   No Nausea, Vomiting or Diarrhea       Patient has been seen and examined prior to discharge   Objective   Blood pressure 127/69, pulse 76, temperature 98.1 F (36.7 C), resp. rate 16, height '5\' 2"'$  (1.575 m), weight 56.7 kg, SpO2 96 %.   Intake/Output Summary (Last 24 hours) at 03/28/2022 1254 Last data filed at 03/28/2022 0900 Gross per 24 hour  Intake 720 ml  Output --  Net 720 ml   Exam Gen:- Awake Alert, no acute distress  HEENT:- [place].AT, No sclera icterus Neck-Supple Neck,No JVD,.  Lungs-  CTAB , good air movement bilaterally CV- S1, S2 normal, regular Abd-  +ve B.Sounds, Abd Soft, No tenderness,    Extremity/Skin:- No  edema,   good pulses Psych-affect is appropriate, oriented x3 Neuro-no new focal deficits, no further tremors    Data Review   CBC w Diff:  Lab Results  Component Value Date   WBC 5.0 03/25/2022   HGB 10.4 (L) 03/25/2022   HGB 11.4 07/01/2021   HCT 32.9 (L) 03/25/2022   HCT 35.4 07/01/2021   PLT 143 (L) 03/25/2022   PLT 182 07/01/2021   LYMPHOPCT 42 03/18/2022   MONOPCT 12 03/18/2022   EOSPCT 5 03/18/2022   BASOPCT 1 03/18/2022    CMP:  Lab Results  Component Value Date   NA 138 03/26/2022   NA 142 07/01/2021   K 3.7 03/26/2022   CL 107 03/26/2022   CO2 24 03/26/2022   BUN 10 03/26/2022   BUN 20 07/01/2021   CREATININE 0.94 03/26/2022   CREATININE 0.79 11/06/2012   PROT 6.5 03/18/2022   PROT  6.0 07/01/2021   ALBUMIN 3.5 03/26/2022   ALBUMIN 4.2 07/01/2021   BILITOT 0.8 03/18/2022   BILITOT 0.2 07/01/2021   ALKPHOS 89 03/18/2022   AST 97 (H) 03/18/2022   ALT 71 (H) 03/18/2022  .  Total Discharge time is about 33 minutes  Roxan Hockey M.D on 03/28/2022 at 12:54 PM  Go to [email or web address] -  for contact info  [facility] - Office  [phone]

## 2022-03-28 NOTE — Discharge Instructions (Addendum)
1) outpatient or inpatient alcohol rehab program strongly advised--- 2) complete abstinence from alcohol strongly advised 3)Avoid ibuprofen/Advil/Aleve/Motrin/Goody Powders/Naproxen/BC powders/Meloxicam/Diclofenac/Indomethacin and other Nonsteroidal anti-inflammatory medications as these will make you more likely to bleed and can cause stomach ulcers, can also cause Kidney problems.  4)Repeat CBC and BMP with your primary care physician in 1 to 2 weeks 5) abstinence from tobacco advised 6) follow-up with orthopedic surgeon/spine specialist for your chronic back pain, you may also consider going to pain clinic for pain management

## 2022-03-28 NOTE — Progress Notes (Signed)
Patient offered substance use resources. Patient accepting. Patient provided both inpatient and outpatient resources for substance use.   Katura Eatherly, Clydene Pugh, LCSW

## 2022-03-28 NOTE — Care Management Important Message (Signed)
Important Message  Patient Details  Name: Stacy Dalton MRN: [id number] Date of Birth: [birth year]   Medicare Important Message Given:  Yes     Tommy Medal 03/28/2022, 1:40 PM

## 2022-04-02 ENCOUNTER — Other Ambulatory Visit: Payer: Self-pay | Admitting: *Deleted

## 2022-04-02 NOTE — Patient Outreach (Signed)
  Care Coordination   04/02/2022  Name: Stacy Dalton MRN: [id number] DOB: [birth year]   Care Coordination Outreach Attempts:  A second unsuccessful outreach was attempted today to offer the patient with information about available care coordination services as a benefit of their health plan.   HIPAA compliant message left on voicemail for patient, providing contact information for CSW, encouraging patient to return CSW's call at her earliest convenience.  Follow Up Plan:  Additional outreach attempts will be made to offer the patient care coordination information and services.   Encounter Outcome:  No Answer.   Care Coordination Interventions Activated:  No.    Care Coordination Interventions:  No, not indicated.    Nat Christen, BSW, MSW, LCSW  Licensed Education officer, [facility]  Mailing [place] [address], [place], [place] [address] Physical Address[address], [place], [place] [address] Toll Free Main # [phone] Fax # [phone] Cell # [phone] [email or web address]

## 2022-04-04 ENCOUNTER — Other Ambulatory Visit: Payer: Self-pay | Admitting: *Deleted

## 2022-04-04 NOTE — Patient Outreach (Signed)
  Care Coordination   04/04/2022  Name: Stacy Dalton MRN: [id number] DOB: 22-Jul-1950   Care Coordination Outreach Attempts:  A third unsuccessful outreach was attempted today to offer the patient with information about available care coordination services as a benefit of their health plan. HIPAA compliant message left on voicemail, providing contact information for CSW, encouraging patient to return CSW's call at her earliest convenience.    Follow Up Plan:  No further outreach attempts will be made at this time. We have been unable to contact the patient to offer or enroll patient in care coordination services.  Encounter Outcome:  No Answer.   Care Coordination Interventions Activated:  No.    Care Coordination Interventions:  No, not indicated.    Nat Christen, BSW, MSW, LCSW  Licensed Education officer, environmental [facility]  Mailing [place] [address] [place], [place] [address] Physical Address[address], [place], [place] [address] Toll Free Main # [phone] Fax # [phone] Cell # [phone] [email or web address]

## 2022-04-06 ENCOUNTER — Encounter (HOSPITAL_COMMUNITY): Payer: Self-pay

## 2022-04-06 ENCOUNTER — Other Ambulatory Visit: Payer: Self-pay

## 2022-04-06 ENCOUNTER — Inpatient Hospital Stay (HOSPITAL_COMMUNITY)
Admission: EM | Admit: 2022-04-06 | Discharge: 2022-04-11 | DRG: 897 | Disposition: A | Discharge status: Skilled Nursing Facility | Payer: Medicare Other | Attending: Internal Medicine | Admitting: Internal Medicine

## 2022-04-06 DIAGNOSIS — E869 Volume depletion, unspecified: Secondary | ICD-10-CM | POA: Diagnosis present

## 2022-04-06 DIAGNOSIS — F10130 Alcohol abuse with withdrawal, uncomplicated: Principal | ICD-10-CM | POA: Diagnosis present

## 2022-04-06 DIAGNOSIS — F32A Depression, unspecified: Secondary | ICD-10-CM | POA: Diagnosis present

## 2022-04-06 DIAGNOSIS — Z888 Allergy status to other drugs, medicaments and biological substances status: Secondary | ICD-10-CM

## 2022-04-06 DIAGNOSIS — F10939 Alcohol use, unspecified with withdrawal, unspecified: Secondary | ICD-10-CM | POA: Diagnosis not present

## 2022-04-06 DIAGNOSIS — J449 Chronic obstructive pulmonary disease, unspecified: Secondary | ICD-10-CM | POA: Diagnosis present

## 2022-04-06 DIAGNOSIS — R000 Tachycardia, unspecified: Secondary | ICD-10-CM | POA: Diagnosis not present

## 2022-04-06 DIAGNOSIS — K219 Gastro-esophageal reflux disease without esophagitis: Secondary | ICD-10-CM | POA: Diagnosis present

## 2022-04-06 DIAGNOSIS — E039 Hypothyroidism, unspecified: Secondary | ICD-10-CM | POA: Diagnosis present

## 2022-04-06 DIAGNOSIS — E876 Hypokalemia: Secondary | ICD-10-CM | POA: Diagnosis present

## 2022-04-06 DIAGNOSIS — G8929 Other chronic pain: Secondary | ICD-10-CM | POA: Diagnosis present

## 2022-04-06 DIAGNOSIS — I10 Essential (primary) hypertension: Secondary | ICD-10-CM | POA: Diagnosis not present

## 2022-04-06 DIAGNOSIS — Z6822 Body mass index (BMI) 22.0-22.9, adult: Secondary | ICD-10-CM

## 2022-04-06 DIAGNOSIS — F10930 Alcohol use, unspecified with withdrawal, uncomplicated: Principal | ICD-10-CM

## 2022-04-06 DIAGNOSIS — F1120 Opioid dependence, uncomplicated: Secondary | ICD-10-CM | POA: Diagnosis present

## 2022-04-06 DIAGNOSIS — K2920 Alcoholic gastritis without bleeding: Secondary | ICD-10-CM | POA: Diagnosis present

## 2022-04-06 DIAGNOSIS — G894 Chronic pain syndrome: Secondary | ICD-10-CM | POA: Diagnosis present

## 2022-04-06 DIAGNOSIS — E872 Acidosis, unspecified: Secondary | ICD-10-CM | POA: Diagnosis present

## 2022-04-06 DIAGNOSIS — Z7951 Long term (current) use of inhaled steroids: Secondary | ICD-10-CM

## 2022-04-06 DIAGNOSIS — Z825 Family history of asthma and other chronic lower respiratory diseases: Secondary | ICD-10-CM

## 2022-04-06 DIAGNOSIS — R0902 Hypoxemia: Secondary | ICD-10-CM | POA: Diagnosis present

## 2022-04-06 DIAGNOSIS — Z79899 Other long term (current) drug therapy: Secondary | ICD-10-CM

## 2022-04-06 DIAGNOSIS — Z87891 Personal history of nicotine dependence: Secondary | ICD-10-CM

## 2022-04-06 DIAGNOSIS — M549 Dorsalgia, unspecified: Secondary | ICD-10-CM | POA: Diagnosis present

## 2022-04-06 DIAGNOSIS — Z8249 Family history of ischemic heart disease and other diseases of the circulatory system: Secondary | ICD-10-CM

## 2022-04-06 DIAGNOSIS — R627 Adult failure to thrive: Secondary | ICD-10-CM | POA: Diagnosis present

## 2022-04-06 DIAGNOSIS — K760 Fatty (change of) liver, not elsewhere classified: Secondary | ICD-10-CM | POA: Diagnosis present

## 2022-04-06 DIAGNOSIS — Z79890 Hormone replacement therapy (postmenopausal): Secondary | ICD-10-CM

## 2022-04-06 DIAGNOSIS — Z885 Allergy status to narcotic agent status: Secondary | ICD-10-CM

## 2022-04-06 DIAGNOSIS — R7401 Elevation of levels of liver transaminase levels: Secondary | ICD-10-CM

## 2022-04-06 LAB — RAPID URINE DRUG SCREEN, HOSP PERFORMED
Amphetamines: NOT DETECTED
Barbiturates: NOT DETECTED
Benzodiazepines: POSITIVE — AB
Cocaine: NOT DETECTED
Opiates: NOT DETECTED
Tetrahydrocannabinol: NOT DETECTED

## 2022-04-06 LAB — COMPREHENSIVE METABOLIC PANEL
ALT: 190 U/L — ABNORMAL HIGH (ref 0–44)
AST: 242 U/L — ABNORMAL HIGH (ref 15–41)
Albumin: 4.6 g/dL (ref 3.5–5.0)
Alkaline Phosphatase: 110 U/L (ref 38–126)
Anion gap: 25 — ABNORMAL HIGH (ref 5–15)
BUN: 15 mg/dL (ref 8–23)
CO2: 18 mmol/L — ABNORMAL LOW (ref 22–32)
Calcium: 9.6 mg/dL (ref 8.9–10.3)
Chloride: 96 mmol/L — ABNORMAL LOW (ref 98–111)
Creatinine, Ser: 0.87 mg/dL (ref 0.44–1.00)
GFR, Estimated: >60 mL/min (ref >60)
Glucose, Bld: 129 mg/dL — ABNORMAL HIGH (ref 70–99)
Potassium: 3.9 mmol/L (ref 3.5–5.1)
Sodium: 139 mmol/L (ref 135–145)
Total Bilirubin: 1.9 mg/dL — ABNORMAL HIGH (ref 0.3–1.2)
Total Protein: 8.3 g/dL — ABNORMAL HIGH (ref 6.5–8.1)

## 2022-04-06 LAB — CBC WITH DIFFERENTIAL/PLATELET
Abs Immature Granulocytes: 0.07 10*3/uL (ref 0.00–0.07)
Basophils Absolute: 0.1 10*3/uL (ref 0.0–0.1)
Basophils Relative: 1 %
Eosinophils Absolute: 0 10*3/uL (ref 0.0–0.5)
Eosinophils Relative: 0 %
HCT: 34.8 % — ABNORMAL LOW (ref 36.0–46.0)
Hemoglobin: 11 g/dL — ABNORMAL LOW (ref 12.0–15.0)
Immature Granulocytes: 1 %
Lymphocytes Relative: 6 %
Lymphs Abs: 0.6 10*3/uL — ABNORMAL LOW (ref 0.7–4.0)
MCH: 25.3 pg — ABNORMAL LOW (ref 26.0–34.0)
MCHC: 31.6 g/dL (ref 30.0–36.0)
MCV: 80 fL (ref 80.0–100.0)
Monocytes Absolute: 0.5 10*3/uL (ref 0.1–1.0)
Monocytes Relative: 4 %
Neutro Abs: 9.4 10*3/uL — ABNORMAL HIGH (ref 1.7–7.7)
Neutrophils Relative %: 88 %
Platelets: 322 10*3/uL (ref 150–400)
RBC: 4.35 MIL/uL (ref 3.87–5.11)
RDW: 19.4 % — ABNORMAL HIGH (ref 11.5–15.5)
WBC: 10.6 10*3/uL — ABNORMAL HIGH (ref 4.0–10.5)
nRBC: 0 % (ref 0.0–0.2)

## 2022-04-06 LAB — MAGNESIUM: Magnesium: 1.4 mg/dL — ABNORMAL LOW (ref 1.7–2.4)

## 2022-04-06 LAB — ETHANOL: Alcohol, Ethyl (B): <10 mg/dL (ref <10)

## 2022-04-06 LAB — LACTIC ACID, PLASMA
Lactic Acid, Venous: 2.2 mmol/L (ref 0.5–1.9)
Lactic Acid, Venous: 1.7 mmol/L (ref 0.5–1.9)

## 2022-04-06 LAB — CBG MONITORING, ED: Glucose-Capillary: 128 mg/dL — ABNORMAL HIGH (ref 70–99)

## 2022-04-06 LAB — LIPASE, BLOOD: Lipase: 28 U/L (ref 11–51)

## 2022-04-06 MED ORDER — ADULT MULTIVITAMIN W/MINERALS CH
1.0000 | ORAL_TABLET | Freq: Every day | ORAL | Status: DC
  Administered 2022-04-06 – 2022-04-11 (×6): 1 via ORAL
  Filled 2022-04-06 (×6): qty 1

## 2022-04-06 MED ORDER — LORAZEPAM 2 MG/ML IJ SOLN
1.0000 mg | INTRAMUSCULAR | Status: AC | PRN
  Administered 2022-04-06: 1 mg via INTRAVENOUS
  Administered 2022-04-06: 2 mg via INTRAVENOUS
  Administered 2022-04-07 (×3): 1 mg via INTRAVENOUS
  Administered 2022-04-07 – 2022-04-08 (×2): 2 mg via INTRAVENOUS
  Administered 2022-04-08 – 2022-04-09 (×2): 1 mg via INTRAVENOUS
  Filled 2022-04-06 (×9): qty 1

## 2022-04-06 MED ORDER — THIAMINE MONONITRATE 100 MG PO TABS
100.0000 mg | ORAL_TABLET | Freq: Every day | ORAL | Status: DC
  Administered 2022-04-07 – 2022-04-11 (×5): 100 mg via ORAL
  Filled 2022-04-06 (×6): qty 1

## 2022-04-06 MED ORDER — LACTATED RINGERS IV BOLUS
1000.0000 mL | Freq: Once | INTRAVENOUS | Status: AC
  Administered 2022-04-06: 1000 mL via INTRAVENOUS

## 2022-04-06 MED ORDER — LORAZEPAM 1 MG PO TABS
1.0000 mg | ORAL_TABLET | ORAL | Status: AC | PRN
  Administered 2022-04-07 – 2022-04-09 (×5): 1 mg via ORAL
  Filled 2022-04-06 (×5): qty 1

## 2022-04-06 MED ORDER — THIAMINE HCL 100 MG/ML IJ SOLN
100.0000 mg | Freq: Every day | INTRAMUSCULAR | Status: DC
  Administered 2022-04-06: 100 mg via INTRAVENOUS
  Filled 2022-04-06: qty 2

## 2022-04-06 MED ORDER — MAGNESIUM SULFATE 2 GM/50ML IV SOLN
2.0000 g | Freq: Once | INTRAVENOUS | Status: AC
  Administered 2022-04-06: 2 g via INTRAVENOUS
  Filled 2022-04-06: qty 50

## 2022-04-06 MED ORDER — FOLIC ACID 1 MG PO TABS
1.0000 mg | ORAL_TABLET | Freq: Every day | ORAL | Status: DC
  Administered 2022-04-06 – 2022-04-11 (×6): 1 mg via ORAL
  Filled 2022-04-06 (×6): qty 1

## 2022-04-06 MED ORDER — ONDANSETRON HCL 4 MG/2ML IJ SOLN
4.0000 mg | Freq: Once | INTRAMUSCULAR | Status: AC
  Administered 2022-04-06: 4 mg via INTRAVENOUS
  Filled 2022-04-06: qty 2

## 2022-04-06 NOTE — ED Provider Notes (Signed)
  Provider Note MRN:  [id number]  Arrival date & time: 04/06/22    ED Course and Medical Decision Making  Assumed care from PA Lauren Autry at shift change.  Alcohol withdrawal awaiting admission.  Procedures  Final Clinical Impressions(s) / ED Diagnoses     ICD-10-CM   1. Alcohol withdrawal syndrome without complication [facility])  L89.211       ED Discharge Orders     None       Discharge Instructions   None     Barth Kirks. [place] Small, [place] [email or web address]    Maudie Flakes, MD 04/07/22 [id number]

## 2022-04-06 NOTE — ED Provider Notes (Signed)
[facility] EMERGENCY DEPARTMENT Provider Note   CSN: [id number] Arrival date & time: 04/06/22  1819     History  Chief Complaint  Patient presents with   Alcohol Problem    Stacy Dalton is a 71 y.o. female.  With past medical history of COPD, not on O2, hypertension, GERD, alcohol abuse who presents to the emergency department with nausea.  Patient states that "I drank too much yesterday."  She states that her last drink was around 8 PM last night.  She states that she is drinking around a bottle of vodka a day.  Denies other alcohol use such as beer or wine.  She denies any drug use.  She states that she feels as if she is withdrawing.  She is having headache, nausea with nonbloody emesis.  She denies having any hallucinations, SI/HI, abdominal pain or diarrhea or fevers.  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   Alcohol Problem Associated symptoms include headaches. Pertinent negatives include no abdominal pain and no shortness of breath.       Home Medications Prior to Admission medications   Medication Sig Start Date End Date Taking? Authorizing Provider  albuterol (VENTOLIN HFA) 108 (90 Base) MCG/ACT inhaler Inhale 2 puffs into the lungs every 4 (four) hours as needed for wheezing or shortness of breath. 03/28/22  Yes Emokpae, Courage, MD  amLODipine (NORVASC) 10 MG tablet Take 1 tablet (10 mg total) by mouth daily. 03/28/22  Yes Roxan Hockey, MD  hydrOXYzine (VISTARIL) 25 MG capsule Take 1 capsule (25 mg total) by mouth every 4 (four) hours as needed for anxiety or nausea. 03/28/22  Yes Roxan Hockey, MD  levothyroxine (SYNTHROID) 50 MCG tablet Take 1 tablet (50 mcg total) by mouth daily before breakfast. 03/28/22  Yes Emokpae, Courage, MD  cyanocobalamin (VITAMIN B12) 1000 MCG tablet Take 1 tablet (1,000 mcg total) by mouth daily. Patient not taking: Reported on 04/06/2022 03/28/22   Roxan Hockey, MD  DULoxetine (CYMBALTA) 60 MG capsule Take 1  capsule (60 mg total) by mouth daily. Patient not taking: Reported on 04/06/2022 03/28/22   Roxan Hockey, MD  Fluticasone-Umeclidin-Vilant (TRELEGY ELLIPTA) 200-62.5-25 MCG/ACT AEPB Inhale 1 puff into the lungs daily. Patient not taking: Reported on 04/06/2022 03/28/22   Roxan Hockey, MD  folic acid (FOLVITE) 1 MG tablet Take 1 tablet (1 mg total) by mouth daily. Patient not taking: Reported on 04/06/2022 03/28/22   Roxan Hockey, MD  losartan (COZAAR) 50 MG tablet Take 1 tablet (50 mg total) by mouth daily. Patient not taking: Reported on 04/06/2022 03/28/22   Roxan Hockey, MD  magnesium oxide (MAG-OX) 400 (240 Mg) MG tablet Take 1 tablet (400 mg total) by mouth daily. Patient not taking: Reported on 04/06/2022 03/28/22   Roxan Hockey, MD  metoprolol tartrate (LOPRESSOR) 25 MG tablet Take 1 tablet (25 mg total) by mouth 2 (two) times daily. Patient not taking: Reported on 04/06/2022 03/28/22   Roxan Hockey, MD  Multiple Vitamin (MULTIVITAMIN WITH MINERALS) TABS tablet Take 1 tablet by mouth daily. Patient not taking: Reported on 04/06/2022 03/28/22   Roxan Hockey, MD  ondansetron (ZOFRAN) 4 MG tablet Take 1 tablet (4 mg total) by mouth every 6 (six) hours. Patient not taking: Reported on 04/06/2022 03/28/22   Roxan Hockey, MD  pantoprazole (PROTONIX) 40 MG tablet Take 1 tablet (40 mg total) by mouth 2 (two) times daily. Patient not taking: Reported on 04/06/2022 03/28/22   Roxan Hockey, MD  sucralfate (CARAFATE) 1 GM/10ML suspension  Take 10 mLs (1 g total) by mouth 4 (four) times daily -  with meals and at bedtime. Patient not taking: Reported on 04/06/2022 03/28/22   Roxan Hockey, MD  thiamine (VITAMIN B1) 100 MG tablet Take 1 tablet (100 mg total) by mouth daily. Patient not taking: Reported on 04/06/2022 03/28/22   Roxan Hockey, MD  traZODone (DESYREL) 100 MG tablet Take 1 tablet (100 mg total) by mouth at bedtime. Patient not taking: Reported on 04/06/2022 03/28/22    Roxan Hockey, MD      Allergies    Librium [chlordiazepoxide], Toradol [ketorolac tromethamine], Butalbital-apap-caffeine, Demerol, Esgic [butalbital-apap-caffeine], Iodine, Limonene, Meperidine, and Pyridium [phenazopyridine]    Review of Systems   Review of Systems  Constitutional:  Positive for appetite change and diaphoresis.  Respiratory:  Negative for shortness of breath.   Gastrointestinal:  Positive for nausea and vomiting. Negative for abdominal distention, abdominal pain, constipation and diarrhea.  Neurological:  Positive for tremors and headaches.  All other systems reviewed and are negative.   Physical Exam Updated Vital Signs BP (!) 168/98   Pulse (!) 109   Temp 98.1 F (36.7 C) (Oral)   Resp 18   Ht '5\' 1"'$  (1.549 m)   Wt 55 kg   SpO2 97%   BMI 22.91 kg/m  Physical Exam Vitals and nursing note reviewed.  Constitutional:      General: She is in acute distress.     Appearance: She is ill-appearing.  HENT:     Head: Normocephalic and atraumatic.     Nose: Nose normal.     Mouth/Throat:     Mouth: Mucous membranes are dry.     Pharynx: Oropharynx is clear.  Eyes:     General: No scleral icterus.    Extraocular Movements: Extraocular movements intact.  Cardiovascular:     Rate and Rhythm: Regular rhythm. Tachycardia present.     Pulses: Normal pulses.     Heart sounds: No murmur heard. Pulmonary:     Effort: Pulmonary effort is normal. No respiratory distress.     Breath sounds: Normal breath sounds.  Abdominal:     General: Bowel sounds are normal. There is no distension.     Palpations: Abdomen is soft.     Tenderness: There is no abdominal tenderness.  Musculoskeletal:        General: Normal range of motion.     Cervical back: Neck supple.  Skin:    General: Skin is warm and dry.     Capillary Refill: Capillary refill takes less than 2 seconds.  Neurological:     General: No focal deficit present.     Mental Status: She is alert and oriented  to person, place, and time. Mental status is at baseline.     Motor: Tremor present. No weakness.     Comments: Grossly tremulous on exam  Psychiatric:        Mood and Affect: Mood normal.        Behavior: Behavior normal.        Thought Content: Thought content normal.        Judgment: Judgment normal.    ED Results / Procedures / Treatments   Labs (all labs ordered are listed, but only abnormal results are displayed) Labs Reviewed  COMPREHENSIVE METABOLIC PANEL - Abnormal; Notable for the following components:      Result Value   Chloride 96 (*)    CO2 18 (*)    Glucose, Bld 129 (*)    Total  Protein 8.3 (*)    AST 242 (*)    ALT 190 (*)    Total Bilirubin 1.9 (*)    Anion gap 25 (*)    All other components within normal limits  CBC WITH DIFFERENTIAL/PLATELET - Abnormal; Notable for the following components:   WBC 10.6 (*)    Hemoglobin 11.0 (*)    HCT 34.8 (*)    MCH 25.3 (*)    RDW 19.4 (*)    Neutro Abs 9.4 (*)    Lymphs Abs 0.6 (*)    All other components within normal limits  RAPID URINE DRUG SCREEN, HOSP PERFORMED - Abnormal; Notable for the following components:   Benzodiazepines POSITIVE (*)    All other components within normal limits  MAGNESIUM - Abnormal; Notable for the following components:   Magnesium 1.4 (*)    All other components within normal limits  LACTIC ACID, PLASMA - Abnormal; Notable for the following components:   Lactic Acid, Venous 2.2 (*)    All other components within normal limits  CBG MONITORING, ED - Abnormal; Notable for the following components:   Glucose-Capillary 128 (*)    All other components within normal limits  ETHANOL  LIPASE, BLOOD  LACTIC ACID, PLASMA   EKG None  Radiology No results found.  Procedures Procedures   Medications Ordered in ED Medications  LORazepam (ATIVAN) tablet 1-4 mg ( Oral See Alternative 04/06/22 2110)    Or  LORazepam (ATIVAN) injection 1-4 mg (1 mg Intravenous Given 04/06/22 2110)   thiamine (VITAMIN B1) tablet 100 mg ( Oral See Alternative 04/06/22 2056)    Or  thiamine (VITAMIN B1) injection 100 mg (100 mg Intravenous Given 13/0/86 5784)  folic acid (FOLVITE) tablet 1 mg (1 mg Oral Given 04/06/22 2058)  multivitamin with minerals tablet 1 tablet (1 tablet Oral Given 04/06/22 2058)  lactated ringers bolus 1,000 mL (0 mLs Intravenous Stopped 04/06/22 2055)  ondansetron (ZOFRAN) injection 4 mg (4 mg Intravenous Given 04/06/22 2056)  magnesium sulfate IVPB 2 g 50 mL (0 g Intravenous Stopped 04/06/22 2127)    ED Course/ Medical Decision Making/ A&P                           Medical Decision Making Amount and/or Complexity of Data Reviewed Labs: ordered.  Risk OTC drugs. Prescription drug management. Decision regarding hospitalization.   This patient presents to the ED with chief complaint(s) of alcohol withdrawal with pertinent past medical history of alcohol abuse, seizure, COPD, HTN which further complicates the presenting complaint. The complaint involves an extensive differential diagnosis and treatment options and also carries with it a high risk of complications and morbidity.    The differential diagnosis includes intoxication, polysubstance use or coingestion, withdrawal, DT   Additional history obtained: Additional history obtained from  none available Records reviewed Care Everywhere/External Records and Primary Care Documents Most recent ED physician note  ED Course: Lab Tests: I Ordered, and personally interpreted labs.  The pertinent results include:   Alcohol negative  CBC stable from previous  CMP with LFT elevation consistent with chronic alcohol use. She does have AG 25, likely from ethanol, although obtained lactic. Lactic 2.2 likely from dehydration, decreased PO intake. Mag 1.4, replaced  Lipase is negative  Imaging Studies: None indicated  Cardiac Monitoring: The patient was maintained on a cardiac monitor.  I personally viewed and  interpreted the cardiac monitor which showed an underlying rhythm of:  sinus tachycardia  Medicines ordered and prescription drug management: I ordered the following medications ativan, folic acid, MTVN, thiamine for alcohol withdrawal. LR for dehydration. Zofran for nausea, magnesium for replacement  I considered this additional medications: n/a Reevaluation of the patient after these medicines showed that the patient    improved  Reassessment and review  71 year old female who presents to the emergency department with alcohol withdrawal.   Last drink 8pm last night. Currently having withdrawal symptoms. She is grossly tremulous on exam. Mildly diaphoretic. Nauseated. She is tachycardic.  Labs including magnesium and lipase drawn.  Initial CIWA 11. CIWA protocol ordered and will given Ativan, vitamins.  Also giving LR, Zofran.   Magnesium is 1.4 -> replacing. Transaminitis consistent with EtOH use. She does have significant AG likely from ethanol, although I added on lactic.   Lactic 2.2, likely from diet and substance abuse. Contributing to AG.   Repeated CIWA 9. Patient continues to be symptomatic and tremulous. States her symptoms are improving but still very present. She has history of alcohol withdrawal seizure. At this time feel she is appropriate for admission for ongoing CIWA protocol and seizure precautions.   I called and spoke with Dr. Garth Schlatter who will admit patient once he is called by floor coordinator. At end of shift discussed with Dr. Sedonia Small who will ensure pt is admitted.   Consultations Obtained: I requested consultation with the admitting physician Dr. Garth Schlatter , and discussed  findings as well as pertinent plan - they recommend: admission  Complexity of problems addressed: Patient's presentation is most consistent with  exacerbation of chronic illness During patient's assessment  Disposition: After consideration of the diagnostic results and the patient's response to  treatment,  I feel that the patent would benefit from admission hospitalist .  Social Determinants of Health: Patient's  low health literacy, substance abuse   increases the complexity of managing their presentation  Final Clinical Impression(s) / ED Diagnoses Final diagnoses:  Alcohol withdrawal syndrome without complication [facility])    Rx / DC Orders ED Discharge Orders     None         Mickie Hillier, PA-C 04/06/22 2351    Fredia Sorrow, MD 04/11/22 1057

## 2022-04-06 NOTE — ED Notes (Signed)
Pt given sandwich, chips, and drink per request.

## 2022-04-06 NOTE — ED Triage Notes (Signed)
Pt brought in by EMS c/o generalized weakness and nausea. Last ETOH yesterday.

## 2022-04-07 ENCOUNTER — Encounter (HOSPITAL_COMMUNITY): Payer: Self-pay | Admitting: Internal Medicine

## 2022-04-07 DIAGNOSIS — F32A Depression, unspecified: Secondary | ICD-10-CM | POA: Diagnosis present

## 2022-04-07 DIAGNOSIS — E872 Acidosis, unspecified: Secondary | ICD-10-CM | POA: Diagnosis not present

## 2022-04-07 DIAGNOSIS — J449 Chronic obstructive pulmonary disease, unspecified: Secondary | ICD-10-CM

## 2022-04-07 DIAGNOSIS — R7401 Elevation of levels of liver transaminase levels: Secondary | ICD-10-CM | POA: Diagnosis not present

## 2022-04-07 DIAGNOSIS — Z87891 Personal history of nicotine dependence: Secondary | ICD-10-CM | POA: Diagnosis not present

## 2022-04-07 DIAGNOSIS — R0902 Hypoxemia: Secondary | ICD-10-CM | POA: Diagnosis present

## 2022-04-07 DIAGNOSIS — G894 Chronic pain syndrome: Secondary | ICD-10-CM | POA: Diagnosis not present

## 2022-04-07 DIAGNOSIS — F1120 Opioid dependence, uncomplicated: Secondary | ICD-10-CM | POA: Diagnosis not present

## 2022-04-07 DIAGNOSIS — E876 Hypokalemia: Secondary | ICD-10-CM | POA: Diagnosis not present

## 2022-04-07 DIAGNOSIS — E869 Volume depletion, unspecified: Secondary | ICD-10-CM | POA: Diagnosis present

## 2022-04-07 DIAGNOSIS — M549 Dorsalgia, unspecified: Secondary | ICD-10-CM | POA: Diagnosis present

## 2022-04-07 DIAGNOSIS — R531 Weakness: Secondary | ICD-10-CM

## 2022-04-07 DIAGNOSIS — Z79899 Other long term (current) drug therapy: Secondary | ICD-10-CM | POA: Diagnosis not present

## 2022-04-07 DIAGNOSIS — G8929 Other chronic pain: Secondary | ICD-10-CM | POA: Diagnosis present

## 2022-04-07 DIAGNOSIS — K219 Gastro-esophageal reflux disease without esophagitis: Secondary | ICD-10-CM

## 2022-04-07 DIAGNOSIS — R627 Adult failure to thrive: Secondary | ICD-10-CM | POA: Diagnosis present

## 2022-04-07 DIAGNOSIS — E039 Hypothyroidism, unspecified: Secondary | ICD-10-CM | POA: Diagnosis not present

## 2022-04-07 DIAGNOSIS — Z6822 Body mass index (BMI) 22.0-22.9, adult: Secondary | ICD-10-CM | POA: Diagnosis not present

## 2022-04-07 DIAGNOSIS — F10930 Alcohol use, unspecified with withdrawal, uncomplicated: Secondary | ICD-10-CM

## 2022-04-07 DIAGNOSIS — Z888 Allergy status to other drugs, medicaments and biological substances status: Secondary | ICD-10-CM | POA: Diagnosis not present

## 2022-04-07 DIAGNOSIS — F10130 Alcohol abuse with withdrawal, uncomplicated: Secondary | ICD-10-CM | POA: Diagnosis present

## 2022-04-07 DIAGNOSIS — Z8249 Family history of ischemic heart disease and other diseases of the circulatory system: Secondary | ICD-10-CM | POA: Diagnosis not present

## 2022-04-07 DIAGNOSIS — K760 Fatty (change of) liver, not elsewhere classified: Secondary | ICD-10-CM | POA: Diagnosis not present

## 2022-04-07 DIAGNOSIS — I10 Essential (primary) hypertension: Secondary | ICD-10-CM | POA: Diagnosis present

## 2022-04-07 DIAGNOSIS — Z885 Allergy status to narcotic agent status: Secondary | ICD-10-CM | POA: Diagnosis not present

## 2022-04-07 DIAGNOSIS — F10939 Alcohol use, unspecified with withdrawal, unspecified: Secondary | ICD-10-CM | POA: Diagnosis present

## 2022-04-07 DIAGNOSIS — Z825 Family history of asthma and other chronic lower respiratory diseases: Secondary | ICD-10-CM | POA: Diagnosis not present

## 2022-04-07 DIAGNOSIS — K2920 Alcoholic gastritis without bleeding: Secondary | ICD-10-CM | POA: Diagnosis present

## 2022-04-07 LAB — COMPREHENSIVE METABOLIC PANEL
ALT: 142 U/L — ABNORMAL HIGH (ref 0–44)
AST: 169 U/L — ABNORMAL HIGH (ref 15–41)
Albumin: 3.7 g/dL (ref 3.5–5.0)
Alkaline Phosphatase: 86 U/L (ref 38–126)
Anion gap: 16 — ABNORMAL HIGH (ref 5–15)
BUN: 13 mg/dL (ref 8–23)
CO2: 24 mmol/L (ref 22–32)
Calcium: 8.7 mg/dL — ABNORMAL LOW (ref 8.9–10.3)
Chloride: 97 mmol/L — ABNORMAL LOW (ref 98–111)
Creatinine, Ser: 0.72 mg/dL (ref 0.44–1.00)
GFR, Estimated: >60 mL/min (ref >60)
Glucose, Bld: 114 mg/dL — ABNORMAL HIGH (ref 70–99)
Potassium: 3.2 mmol/L — ABNORMAL LOW (ref 3.5–5.1)
Sodium: 137 mmol/L (ref 135–145)
Total Bilirubin: 1.7 mg/dL — ABNORMAL HIGH (ref 0.3–1.2)
Total Protein: 6.5 g/dL (ref 6.5–8.1)

## 2022-04-07 LAB — CBC
HCT: 30.4 % — ABNORMAL LOW (ref 36.0–46.0)
Hemoglobin: 9.6 g/dL — ABNORMAL LOW (ref 12.0–15.0)
MCH: 25.6 pg — ABNORMAL LOW (ref 26.0–34.0)
MCHC: 31.6 g/dL (ref 30.0–36.0)
MCV: 81.1 fL (ref 80.0–100.0)
Platelets: 249 10*3/uL (ref 150–400)
RBC: 3.75 MIL/uL — ABNORMAL LOW (ref 3.87–5.11)
RDW: 19.2 % — ABNORMAL HIGH (ref 11.5–15.5)
WBC: 7.3 10*3/uL (ref 4.0–10.5)
nRBC: 0 % (ref 0.0–0.2)

## 2022-04-07 LAB — APTT: aPTT: 27 seconds (ref 24–36)

## 2022-04-07 LAB — PHOSPHORUS: Phosphorus: 3.6 mg/dL (ref 2.5–4.6)

## 2022-04-07 LAB — MAGNESIUM: Magnesium: 1.8 mg/dL (ref 1.7–2.4)

## 2022-04-07 MED ORDER — LOSARTAN POTASSIUM 50 MG PO TABS
50.0000 mg | ORAL_TABLET | Freq: Every day | ORAL | Status: DC
  Administered 2022-04-07 – 2022-04-11 (×5): 50 mg via ORAL
  Filled 2022-04-07 (×5): qty 1

## 2022-04-07 MED ORDER — ENOXAPARIN SODIUM 40 MG/0.4ML IJ SOSY
40.0000 mg | PREFILLED_SYRINGE | INTRAMUSCULAR | Status: DC
  Administered 2022-04-07 – 2022-04-11 (×5): 40 mg via SUBCUTANEOUS
  Filled 2022-04-07 (×5): qty 0.4

## 2022-04-07 MED ORDER — UMECLIDINIUM BROMIDE 62.5 MCG/ACT IN AEPB
1.0000 | INHALATION_SPRAY | Freq: Every day | RESPIRATORY_TRACT | Status: DC
  Administered 2022-04-07 – 2022-04-11 (×5): 1 via RESPIRATORY_TRACT
  Filled 2022-04-07: qty 7

## 2022-04-07 MED ORDER — METOPROLOL TARTRATE 25 MG PO TABS
25.0000 mg | ORAL_TABLET | Freq: Two times a day (BID) | ORAL | Status: DC
  Administered 2022-04-07 – 2022-04-11 (×9): 25 mg via ORAL
  Filled 2022-04-07 (×9): qty 1

## 2022-04-07 MED ORDER — AMLODIPINE BESYLATE 5 MG PO TABS
10.0000 mg | ORAL_TABLET | Freq: Every day | ORAL | Status: DC
  Administered 2022-04-07 – 2022-04-11 (×5): 10 mg via ORAL
  Filled 2022-04-07 (×5): qty 2

## 2022-04-07 MED ORDER — FLUTICASONE FUROATE-VILANTEROL 200-25 MCG/ACT IN AEPB
1.0000 | INHALATION_SPRAY | Freq: Every day | RESPIRATORY_TRACT | Status: DC
  Administered 2022-04-07 – 2022-04-11 (×5): 1 via RESPIRATORY_TRACT
  Filled 2022-04-07: qty 28

## 2022-04-07 MED ORDER — MAGNESIUM OXIDE -MG SUPPLEMENT 400 (240 MG) MG PO TABS
400.0000 mg | ORAL_TABLET | Freq: Every day | ORAL | Status: DC
  Administered 2022-04-07 – 2022-04-11 (×5): 400 mg via ORAL
  Filled 2022-04-07 (×5): qty 1

## 2022-04-07 MED ORDER — PANTOPRAZOLE SODIUM 40 MG PO TBEC
40.0000 mg | DELAYED_RELEASE_TABLET | Freq: Two times a day (BID) | ORAL | Status: DC
  Administered 2022-04-07 – 2022-04-08 (×3): 40 mg via ORAL
  Filled 2022-04-07 (×3): qty 1

## 2022-04-07 MED ORDER — ALBUTEROL SULFATE (2.5 MG/3ML) 0.083% IN NEBU: 3.0000 mL | INHALATION_SOLUTION | RESPIRATORY_TRACT | Status: DC | PRN

## 2022-04-07 MED ORDER — TRAMADOL HCL 50 MG PO TABS
50.0000 mg | ORAL_TABLET | Freq: Four times a day (QID) | ORAL | Status: DC | PRN
  Administered 2022-04-07 – 2022-04-11 (×14): 50 mg via ORAL
  Filled 2022-04-07 (×14): qty 1

## 2022-04-07 MED ORDER — LEVOTHYROXINE SODIUM 50 MCG PO TABS
50.0000 ug | ORAL_TABLET | Freq: Every day | ORAL | Status: DC
  Administered 2022-04-07 – 2022-04-11 (×5): 50 ug via ORAL
  Filled 2022-04-07 (×5): qty 1

## 2022-04-07 MED ORDER — ONDANSETRON HCL 4 MG/2ML IJ SOLN
4.0000 mg | Freq: Four times a day (QID) | INTRAMUSCULAR | Status: DC | PRN
  Administered 2022-04-07 – 2022-04-08 (×4): 4 mg via INTRAVENOUS
  Filled 2022-04-07 (×4): qty 2

## 2022-04-07 MED ORDER — POTASSIUM CHLORIDE CRYS ER 20 MEQ PO TBCR
40.0000 meq | EXTENDED_RELEASE_TABLET | Freq: Once | ORAL | Status: AC
  Administered 2022-04-07: 40 meq via ORAL
  Filled 2022-04-07: qty 2

## 2022-04-07 NOTE — Plan of Care (Signed)
  Problem: Acute Rehab PT Goals(only PT should resolve) Goal: Pt Will Go Supine/Side To Sit Outcome: Progressing Flowsheets (Taken 04/07/2022 1033) Pt will go Supine/Side to Sit:  with min guard assist  with supervision Goal: Patient Will Transfer Sit To/From Stand Outcome: Progressing Flowsheets (Taken 04/07/2022 1033) Patient will transfer sit to/from stand:  with supervision  with min guard assist Goal: Pt Will Transfer Bed To Chair/Chair To Bed Outcome: Progressing Flowsheets (Taken 04/07/2022 1033) Pt will Transfer Bed to Chair/Chair to Bed:  with supervision  min guard assist Goal: Pt Will Ambulate Outcome: Progressing Flowsheets (Taken 04/07/2022 1033) Pt will Ambulate:  25 feet  with min guard assist  with rolling walker Goal: Pt/caregiver will Perform Home Exercise Program Outcome: Progressing Flowsheets (Taken 04/07/2022 1033) Pt/caregiver will Perform Home Exercise Program:  For increased strengthening  For improved balance  Independently  10:34 AM, 04/07/22 Mearl Latin PT, DPT Physical Therapist at [facility]

## 2022-04-07 NOTE — H&P (Signed)
History and Physical    Patient: Stacy Dalton AYT:[id number] DOB: [birth year] DOA: 04/06/2022 DOS: the patient was seen and examined on 04/07/2022 PCP: Pcp, No  Patient coming from: Home  Chief Complaint:  Chief Complaint  Patient presents with   Alcohol Problem   HPI: Stacy Dalton is a 71 y.o. female with medical history significant of hypertension, hypothyroidism, COPD, GERD, alcohol abuse who presents to the emergency department due to generalized weakness with difficulty in ambulation.  Patient states that she drank a lot of alcohol (vodka) on 10/4.  She states that she drinks half a bottle to 1/5 of vodka on a daily basis.  She states that she felt very weak yesterday (10/5) and felt unsteady to walk, she also states that she felt like she was withdrawing, so she decided to go to the ED for further evaluation and management.  Patient denies chest pain, shortness of breath, fever, chills, nausea, vomiting, suicidal thoughts or plan.  ED Course:  In the emergency department, she was tachycardic and intermittently tachypneic, BP was 147/76 and other vital signs are within normal range.  Work-up in the ED showed WBC 10.6, H/H11.0/34.8, MCV 80.0, platelets 322.  BMP showed sodium 139, potassium 3.9, chloride 96, bicarb 18, glucose 129, BUN 15, creatinine 0.87, AST 242, ALT 190, total bilirubin 1.9, magnesium 1.4, lipase 28, alcohol less than 10.  Urine drug screen was positive for benzodiazepine, lactic acid 2.2 > 1.7. Ativan was given, magnesium was replenished, Zofran was given and patient was started on CIWA protocol.  Hospitalist was asked to admit patient for further evaluation and management.  Review of Systems: Review of systems as noted in the HPI. All other systems reviewed and are negative.   Past Medical History:  Diagnosis Date   Chronic pain    COPD (chronic obstructive pulmonary disease) ([place])    told has copd, no current inhaler use   Cough    Depression    ETOH  abuse    GERD (gastroesophageal reflux disease)    Hypertension    Hypothyroidism    Insomnia    Migraine    Migraine    Osteopenia    Pain management    Panic attacks    Small bowel obstruction [facility])    Past Surgical History:  Procedure Laterality Date   ABDOMINAL HYSTERECTOMY     BIOPSY  08/29/2021   Procedure: BIOPSY;  Surgeon: [name], DO;  [facility]: [facility];  Service: Endoscopy;;   COLON SURGERY     COLOSTOMY CLOSURE  04/19/2012   Procedure: COLOSTOMY CLOSURE;  Surgeon: Adin Hector, MD;  Location: [facility];  Service: General;  Laterality: N/A;  Laparotomy, Resection and Closure of Colostomy   COLOSTOMY TAKEDOWN N/A 04/12/2016   Procedure: [place] TAKEDOWN;  Surgeon: Johnathan Hausen, MD;  Location: [facility];  Service: General;  Laterality: N/A;   ESOPHAGOGASTRODUODENOSCOPY (EGD) WITH PROPOFOL N/A 08/29/2021   Procedure: ESOPHAGOGASTRODUODENOSCOPY (EGD) WITH PROPOFOL;  Surgeon: [name], DO;  Location: [facility];  Service: Endoscopy;  Laterality: N/A;   INCONTINENCE SURGERY     LAPAROTOMY  10/04/2011, colostomy also   Procedure: EXPLORATORY LAPAROTOMY;  Surgeon: Adin Hector, MD;  Location: [facility];  Service: General;  Laterality: N/A;  left partial colectomy with colostomy   LAPAROTOMY  04/19/2012   Procedure: EXPLORATORY LAPAROTOMY;  Surgeon: Adin Hector, MD;  Location: [facility];  Service: General;  Laterality: N/A;   LAPAROTOMY N/A 11/29/2015   Procedure: EXPLORATORY  LAPAROTOMY; SUBTOTAL COLECTOMY WITH HARTMAN PROCEDURE AND END COLOSTOMY;  Surgeon: Johnathan Hausen, MD;  Location: [facility];  Service: General;  Laterality: N/A;   TUBAL LIGATION     VENTRAL HERNIA REPAIR  04/19/2012   Procedure: HERNIA REPAIR VENTRAL ADULT;  Surgeon: Adin Hector, MD;  Location: [facility];  Service: General;  Laterality: N/A;    Social History:  reports that she quit smoking about 23 years ago. Her smoking use included cigarettes. She has a 30.00 pack-year smoking  history. She has never used smokeless tobacco. She reports current alcohol use. She reports that she does not use drugs.   Allergies  Allergen Reactions   Librium [Chlordiazepoxide]     Became unresponsive   Toradol [Ketorolac Tromethamine] Shortness Of Breath   Butalbital-Apap-Caffeine Other (See Comments)    jittery   Demerol Swelling   Esgic [Butalbital-Apap-Caffeine] Other (See Comments)    jittery   Iodine Other (See Comments)    bp bottomed out per pt several hours later; unsure if pre medicated in past with cm; [facility]   Limonene     Other reaction(s): Other (See Comments) jittery   Meperidine Swelling   Pyridium [Phenazopyridine] Nausea Only    Family History  Problem Relation Age of Onset   Heart disease Mother    Hypertension Mother    Cancer Sister        sinus   Diabetes Brother    Heart disease Brother    Thyroid disease Brother    Cancer Sister        abdominal ?   Thyroid disease Sister    Cancer Other        GE junction adenocarcinoma   Emphysema Father    Stroke Father    Hypertension Father    Heart disease Father    COPD Sister    Thyroid disease Sister    Thyroid disease Sister    Diabetes Brother    Thyroid disease Brother    Thyroid disease Brother    Hypertension Brother    Post-traumatic stress disorder Brother    COPD Brother    Thyroid disease Brother    Thyroid disease Brother    Hypertension Brother    Transient ischemic attack Brother      Prior to Admission medications   Medication Sig Start Date End Date Taking? Authorizing Provider  albuterol (VENTOLIN HFA) 108 (90 Base) MCG/ACT inhaler Inhale 2 puffs into the lungs every 4 (four) hours as needed for wheezing or shortness of breath. 03/28/22  Yes Emokpae, Courage, MD  amLODipine (NORVASC) 10 MG tablet Take 1 tablet (10 mg total) by mouth daily. 03/28/22  Yes Roxan Hockey, MD  hydrOXYzine (VISTARIL) 25 MG capsule Take 1 capsule (25 mg total) by mouth every 4 (four)  hours as needed for anxiety or nausea. 03/28/22  Yes Roxan Hockey, MD  levothyroxine (SYNTHROID) 50 MCG tablet Take 1 tablet (50 mcg total) by mouth daily before breakfast. 03/28/22  Yes Emokpae, Courage, MD  cyanocobalamin (VITAMIN B12) 1000 MCG tablet Take 1 tablet (1,000 mcg total) by mouth daily. Patient not taking: Reported on 04/06/2022 03/28/22   Roxan Hockey, MD  DULoxetine (CYMBALTA) 60 MG capsule Take 1 capsule (60 mg total) by mouth daily. Patient not taking: Reported on 04/06/2022 03/28/22   Roxan Hockey, MD  Fluticasone-Umeclidin-Vilant (TRELEGY ELLIPTA) 200-62.5-25 MCG/ACT AEPB Inhale 1 puff into the lungs daily. Patient not taking: Reported on 04/06/2022 03/28/22   Roxan Hockey, MD  folic acid (FOLVITE)  1 MG tablet Take 1 tablet (1 mg total) by mouth daily. Patient not taking: Reported on 04/06/2022 03/28/22   Roxan Hockey, MD  losartan (COZAAR) 50 MG tablet Take 1 tablet (50 mg total) by mouth daily. Patient not taking: Reported on 04/06/2022 03/28/22   Roxan Hockey, MD  magnesium oxide (MAG-OX) 400 (240 Mg) MG tablet Take 1 tablet (400 mg total) by mouth daily. Patient not taking: Reported on 04/06/2022 03/28/22   Roxan Hockey, MD  metoprolol tartrate (LOPRESSOR) 25 MG tablet Take 1 tablet (25 mg total) by mouth 2 (two) times daily. Patient not taking: Reported on 04/06/2022 03/28/22   Roxan Hockey, MD  Multiple Vitamin (MULTIVITAMIN WITH MINERALS) TABS tablet Take 1 tablet by mouth daily. Patient not taking: Reported on 04/06/2022 03/28/22   Roxan Hockey, MD  ondansetron (ZOFRAN) 4 MG tablet Take 1 tablet (4 mg total) by mouth every 6 (six) hours. Patient not taking: Reported on 04/06/2022 03/28/22   Roxan Hockey, MD  pantoprazole (PROTONIX) 40 MG tablet Take 1 tablet (40 mg total) by mouth 2 (two) times daily. Patient not taking: Reported on 04/06/2022 03/28/22   Roxan Hockey, MD  sucralfate (CARAFATE) 1 GM/10ML suspension Take 10 mLs (1 g total) by  mouth 4 (four) times daily -  with meals and at bedtime. Patient not taking: Reported on 04/06/2022 03/28/22   Roxan Hockey, MD  thiamine (VITAMIN B1) 100 MG tablet Take 1 tablet (100 mg total) by mouth daily. Patient not taking: Reported on 04/06/2022 03/28/22   Roxan Hockey, MD  traZODone (DESYREL) 100 MG tablet Take 1 tablet (100 mg total) by mouth at bedtime. Patient not taking: Reported on 04/06/2022 03/28/22   Roxan Hockey, MD    Physical Exam: BP (!) 169/77 (BP Location: Left Arm)   Pulse (!) 105   Temp 98.1 F (36.7 C) (Oral)   Resp 19   Ht '5\' 1"'$  (1.549 m)   Wt 55 kg   SpO2 98%   BMI 22.91 kg/m   General: 70 y.o. year-old female ill appearing, but in no acute distress.  Alert and oriented x3. HEENT: NCAT, EOMI, dry mucous membrane Neck: Supple, trachea medial Cardiovascular: Tachycardia.  Regular rate and rhythm with no rubs or gallops.  No thyromegaly or JVD noted.  No lower extremity edema. 2/4 pulses in all 4 extremities. Respiratory: Clear to auscultation with no wheezes or rales. Good inspiratory effort. Abdomen: Soft, nontender nondistended with normal bowel sounds x4 quadrants. Muskuloskeletal: No cyanosis, clubbing or edema noted bilaterally Neuro: Tremors on outstretched hands.  CN II-XII intact, sensation, reflexes intact Skin: No ulcerative lesions noted or rashes Psychiatry: Judgement and insight appear normal. Mood is appropriate for condition and setting          Labs on Admission:  Basic Metabolic Panel: Recent Labs  Lab 04/06/22 1929  NA 139  K 3.9  CL 96*  CO2 18*  GLUCOSE 129*  BUN 15  CREATININE 0.87  CALCIUM 9.6  MG 1.4*   Liver Function Tests: Recent Labs  Lab 04/06/22 1929  AST 242*  ALT 190*  ALKPHOS 110  BILITOT 1.9*  PROT 8.3*  ALBUMIN 4.6   Recent Labs  Lab 04/06/22 1929  LIPASE 28   No results for input(s): "AMMONIA" in the last 168 hours. CBC: Recent Labs  Lab 04/06/22 1929  WBC 10.6*  NEUTROABS 9.4*  HGB  11.0*  HCT 34.8*  MCV 80.0  PLT 322   Cardiac Enzymes: No results for input(s): "CKTOTAL", "CKMB", "CKMBINDEX", "  TROPONINI" in the last 168 hours.  BNP (last 3 results) Recent Labs    08/20/21 2327 09/22/21 0400 12/17/21 1920  BNP 74.0 129.0* 63.0    ProBNP (last 3 results) No results for input(s): "PROBNP" in the last 8760 hours.  CBG: Recent Labs  Lab 04/06/22 1945  GLUCAP 128*    Radiological Exams on Admission: No results found.  EKG: I independently viewed the EKG done and my findings are as followed: Sinus tachycardia at a rate of 124 bpm  Assessment/Plan Present on Admission:  Alcohol withdrawal ([place])  COPD (chronic obstructive pulmonary disease) ([facility])  Hypomagnesemia  GERD (gastroesophageal reflux disease)  Principal Problem:   Alcohol withdrawal ([facility]) Active Problems:   COPD (chronic obstructive pulmonary disease) ([facility])   Acquired hypothyroidism   Hypomagnesemia   Transaminitis   Lactic acidosis   GERD (gastroesophageal reflux disease)  Alcohol withdrawal Alcohol level was < 10 Continue CIWA protocol, folic acid, thiamine and multivitamin Continue fall precaution, seizure precaution and neuro checks Patient will be counseled on alcohol withdrawal cessation when more stable  Generalized weakness in setting of above Continue fall precaution Continue PT/OT eval and treat  Transaminitis possibly secondary to alcohol withdrawal AST 242, ALT 190 Continue to monitor liver enzymes  Lactic acidosis-resolved  Hypomagnesemia Mg level is 1.4 This will be replenished Continue magnesium oxide Please continue to monitor Mg level and correct accordingly  Essential hypertension (uncontrolled) Continue amlodipine, losartan and Lopressor  Acquired hypothyroidism Continue Synthroid  COPD Continue ventolin, Breo Ellipta and Incruse Ellipta  GERD Continue Protonix   DVT prophylaxis: Lovenox  Code Status: Full code  Consults: None  Family  Communication: None at bedside  Severity of Illness: The appropriate patient status for this patient is INPATIENT. Inpatient status is judged to be reasonable and necessary in order to provide the required intensity of service to ensure the patient's safety. The patient's presenting symptoms, physical exam findings, and initial radiographic and laboratory data in the context of their chronic comorbidities is felt to place them at high risk for further clinical deterioration. Furthermore, it is not anticipated that the patient will be medically stable for discharge from the hospital within 2 midnights of admission.   * I certify that at the point of admission it is my clinical judgment that the patient will require inpatient hospital care spanning beyond 2 midnights from the point of admission due to high intensity of service, high risk for further deterioration and high frequency of surveillance required.*  Author: Bernadette Hoit, DO 04/07/2022 4:28 AM  For on call review [email or web address].

## 2022-04-07 NOTE — Progress Notes (Signed)
No charge note  Patient seen and examined this morning, H&P reviewed and agree with the assessment and plan.  In brief, this 71 year old female with HTN, hypothyroidism, EtOH use comes into the hospital with withdrawal symptoms, wanting to quit, and also generalized weakness and difficulties walking.  She was found to have hypokalemia, hypomagnesemia, elevated LFTs as well as anemia.  She has withdrawal symptoms for which she was placed on CIWA.  Continue to closely monitor, last EtOH was 2 days ago on Wednesday.  Avien Taha M. Cruzita Lederer, MD, PhD [facility]  Between 7 am - 7 pm you can contact me via Amion (for emergencies) or [place] (non urgent matters).  I am not available 7 pm - 7 am, please contact night coverage MD/APP via Amion

## 2022-04-07 NOTE — Plan of Care (Signed)
  Problem: Acute Rehab OT Goals (only OT should resolve) Goal: Pt. Will Perform Upper Body Bathing Flowsheets (Taken 04/07/2022 1053) Pt Will Perform Upper Body Bathing: Independently Goal: Pt. Will Perform Lower Body Dressing Flowsheets (Taken 04/07/2022 1053) Pt Will Perform Lower Body Dressing: Independently Goal: Pt. Will Perform Tub/Shower Transfer Flowsheets (Taken 04/07/2022 1053) Pt Will Perform Tub/Shower Transfer: with supervision    Arvil Persons, OTR/L

## 2022-04-07 NOTE — Evaluation (Signed)
Occupational Therapy Evaluation Patient Details Name: Stacy Dalton MRN: [id number] DOB: Jul 21, 1950 Today's Date: 04/07/2022   History of Present Illness Stacy Dalton is a 71 y.o. female with medical history significant of hypertension, hypothyroidism, COPD, GERD, alcohol abuse who presents to the emergency department due to generalized weakness with difficulty in ambulation.  Patient states that she drank a lot of alcohol (vodka) on 10/4.  She states that she drinks half a bottle to 1/5 of vodka on a daily basis.  She states that she felt very weak yesterday (10/5) and felt unsteady to walk, she also states that she felt like she was withdrawing, so she decided to go to the ED for further evaluation and management.  Patient denies chest pain, shortness of breath, fever, chills, nausea, vomiting, suicidal thoughts or plan.   Clinical Impression   Pt agreeable to OT evaluation.  She reports that she lives at home with her boyfriend and is retired and can assist as needed. Pt states that prior to hospitalization she was independent in ADLs/ IADLs. Pt was received sitting in chair and returned to bed after evaluation. She completed sit to stand t/f with HHA, BSC t/f with HHA, and ambulated in room with HHA. Pt was able to complete toilet hygiene independently while seated on BSC. She reports that she would like to go to rehab after discharge to become stronger. Pt would benefit from continued OT services in the acute setting to improve functional mobility, increase independence in ADLS, and increase activity tolerance. Discharge recommendations are written below.      Recommendations for follow up therapy are one component of a multi-disciplinary discharge planning process, led by the attending physician.  Recommendations may be updated based on patient status, additional functional criteria and insurance authorization.   Follow Up Recommendations  Skilled nursing-short term rehab (<3 hours/day)     Assistance Recommended at Discharge Intermittent Supervision/Assistance  Patient can return home with the following A little help with walking and/or transfers;A little help with bathing/dressing/bathroom;Assistance with cooking/housework    Functional Status Assessment  Patient has had a recent decline in their functional status and demonstrates the ability to make significant improvements in function in a reasonable and predictable amount of time.  Equipment Recommendations  None recommended by OT    Recommendations for Other Services       Precautions / Restrictions Precautions Precautions: Fall Restrictions Weight Bearing Restrictions: No      Mobility Bed Mobility Overal bed mobility: Needs Assistance Bed Mobility: Supine to Sit     Supine to sit: Min assist, HOB elevated, Min guard          Transfers Overall transfer level: Needs assistance Equipment used: None (HHA) Transfers: Sit to/from Stand, Bed to chair/wheelchair/BSC Sit to Stand: Min assist Stand pivot transfers: Min assist   Step pivot transfers: Min assist     General transfer comment: HHA      Balance               Standing balance comment: fair with HHA                           ADL either performed or assessed with clinical judgement   ADL Overall ADL's : Needs assistance/impaired Eating/Feeding: Set up   Grooming: Set up Grooming Details (indicate cue type and reason): washed face while seated on BSC Upper Body Bathing: Minimal assistance   Lower Body Bathing: Moderate assistance  Upper Body Dressing : Minimal assistance Upper Body Dressing Details (indicate cue type and reason): min assist to don gown Lower Body Dressing: Moderate assistance   Toilet Transfer: Minimal assistance   Toileting- Clothing Manipulation and Hygiene: Minimal assistance   Tub/ Shower Transfer: Minimal assistance   Functional mobility during ADLs: Minimal assistance        Vision Baseline Vision/History: 0 No visual deficits Ability to See in Adequate Light: 0 Adequate Patient Visual Report: No change from baseline Vision Assessment?: No apparent visual deficits                Pertinent Vitals/Pain Pain Assessment Pain Assessment: 0-10 Pain Score: 8  Pain Location: back and head Pain Descriptors / Indicators: Sore Pain Intervention(s): Premedicated before session, Limited activity within patient's tolerance, Monitored during session     Hand Dominance Right   Extremity/Trunk Assessment Upper Extremity Assessment Upper Extremity Assessment: Overall WFL for tasks assessed   Lower Extremity Assessment Lower Extremity Assessment: Defer to PT evaluation   Cervical / Trunk Assessment Cervical / Trunk Assessment: Normal   Communication Communication Communication: No difficulties   Cognition Arousal/Alertness: Awake/alert Behavior During Therapy: WFL for tasks assessed/performed Overall Cognitive Status: Within Functional Limits for tasks assessed                                                        Home Living Family/patient expects to be discharged to:: Private residence Living Arrangements: Spouse/significant other Available Help at Discharge: Friend(s);Available PRN/intermittently Type of Home: House Home Access: Stairs to enter [employer] of Steps: 3 Entrance Stairs-Rails: None Home Layout: One level     Bathroom Shower/Tub: Occupational psychologist: Standard Bathroom Accessibility: Yes How Accessible: Accessible via walker Home Equipment: Rolling Walker (2 wheels)   Additional Comments: pt reports her daughter lives next door and can stay with her for a couple of nights if needed      Prior Functioning/Environment Prior Level of Function : Independent/Modified Independent             Mobility Comments: states short stance community ambulation, RW PRN ADLs Comments:  states independent        OT Problem List: Decreased strength;Decreased activity tolerance      OT Treatment/Interventions: Self-care/ADL training;Therapeutic exercise;DME and/or AE instruction;Energy conservation;Therapeutic activities    OT Goals(Current goals can be found in the care plan section) Acute Rehab OT Goals Patient Stated Goal: to go to rehab OT Goal Formulation: With patient Time For Goal Achievement: 04/21/22 Potential to Achieve Goals: Good  OT Frequency: Min 1X/week                  AM-PAC OT "6 Clicks" Daily Activity     Outcome Measure Help from another person eating meals?: A Little Help from another person taking care of personal grooming?: A Little Help from another person toileting, which includes using toliet, bedpan, or urinal?: A Little Help from another person bathing (including washing, rinsing, drying)?: A Little Help from another person to put on and taking off regular upper body clothing?: A Little Help from another person to put on and taking off regular lower body clothing?: A Little 6 Click Score: 18   End of Session Nurse Communication: Mobility status  Activity Tolerance: Patient tolerated treatment well Patient left: in  bed;with call bell/phone within reach;with nursing/sitter in room  OT Visit Diagnosis: Unsteadiness on feet (R26.81);Muscle weakness (generalized) (M62.81)                Time: 5520-8022 OT Time Calculation (min): 12 min Charges:  OT General Charges $OT Visit: 1 Visit OT Evaluation $OT Eval Low Complexity: 1 Low   Frederic Jericho, OTR/L  04/07/2022, 10:44 AM

## 2022-04-07 NOTE — Evaluation (Signed)
Physical Therapy Evaluation Patient Details Name: Stacy Dalton MRN: [id number] DOB: 1950-07-25 Today's Date: 04/07/2022  History of Present Illness  Stacy Dalton is a 71 y.o. female with medical history significant of hypertension, hypothyroidism, COPD, GERD, alcohol abuse who presents to the emergency department due to generalized weakness with difficulty in ambulation.  Patient states that she drank a lot of alcohol (vodka) on 10/4.  She states that she drinks half a bottle to 1/5 of vodka on a daily basis.  She states that she felt very weak yesterday (10/5) and felt unsteady to walk, she also states that she felt like she was withdrawing, so she decided to go to the ED for further evaluation and management.  Patient denies chest pain, shortness of breath, fever, chills, nausea, vomiting, suicidal thoughts or plan.   Clinical Impression  Patient limited for functional mobility as stated below secondary to BLE weakness, fatigue and impaired standing balance. Patient demonstrates generalized weakness requiring min assist for all mobility. Patient requires RW for transfer to standing and for ambulation but is limited by fatigue and impaired activity tolerance. Patient ends session seated in chair with RN present. Patient will benefit from continued physical therapy in hospital and recommended venue below to increase strength, balance, endurance for safe ADLs and gait.        Recommendations for follow up therapy are one component of a multi-disciplinary discharge planning process, led by the attending physician.  Recommendations may be updated based on patient status, additional functional criteria and insurance authorization.  Follow Up Recommendations Skilled nursing-short term rehab (<3 hours/day) Can patient physically be transported by private vehicle: Yes    Assistance Recommended at Discharge Intermittent Supervision/Assistance  Patient can return home with the following  A  little help with walking and/or transfers;A little help with bathing/dressing/bathroom;Assistance with cooking/housework;Help with stairs or ramp for entrance;Assist for transportation    Equipment Recommendations None recommended by PT  Recommendations for Other Services       Functional Status Assessment Patient has had a recent decline in their functional status and demonstrates the ability to make significant improvements in function in a reasonable and predictable amount of time.     Precautions / Restrictions Precautions Precautions: Fall Restrictions Weight Bearing Restrictions: No      Mobility  Bed Mobility Overal bed mobility: Needs Assistance Bed Mobility: Supine to Sit     Supine to sit: Min assist, HOB elevated, Min guard          Transfers Overall transfer level: Needs assistance Equipment used: Rolling walker (2 wheels) Transfers: Sit to/from Stand, Bed to chair/wheelchair/BSC Sit to Stand: Min assist   Step pivot transfers: Min assist       General transfer comment: assist to power up to standing with RW    Ambulation/Gait Ambulation/Gait assistance: Min assist Gait Distance (Feet): 12 Feet Assistive device: Rolling walker (2 wheels) Gait Pattern/deviations: Decreased stride length       General Gait Details: labored, unsteady cadence with RW; fatigues quickly  Stairs            Wheelchair Mobility    Modified Rankin (Stroke Patients Only)       Balance Overall balance assessment: Needs assistance Sitting-balance support: No upper extremity supported, Feet supported Sitting balance-Leahy Scale: Good Sitting balance - Comments: on EOB   Standing balance support: Bilateral upper extremity supported, Reliant on assistive device for balance Standing balance-Leahy Scale: Fair Standing balance comment: fair with RW  Pertinent Vitals/Pain Pain Assessment Pain Assessment: 0-10 Pain Score: 8   Pain Location: back and head Pain Descriptors / Indicators: Sore Pain Intervention(s): Limited activity within patient's tolerance, Monitored during session, Repositioned    Home Living Family/patient expects to be discharged to:: Private residence Living Arrangements: Spouse/significant other Available Help at Discharge: Friend(s);Available PRN/intermittently Type of Home: House Home Access: Stairs to enter Entrance Stairs-Rails: None Entrance Stairs-Number of Steps: 3   Home Layout: One level Home Equipment: Conservation officer, nature (2 wheels)      Prior Function Prior Level of Function : Independent/Modified Independent             Mobility Comments: states short stance community ambulation, RW PRN ADLs Comments: states independent     Hand Dominance        Extremity/Trunk Assessment   Upper Extremity Assessment Upper Extremity Assessment: Defer to OT evaluation    Lower Extremity Assessment Lower Extremity Assessment: Generalized weakness    Cervical / Trunk Assessment Cervical / Trunk Assessment: Normal  Communication   Communication: No difficulties  Cognition Arousal/Alertness: Awake/alert Behavior During Therapy: WFL for tasks assessed/performed Overall Cognitive Status: Within Functional Limits for tasks assessed                                          General Comments      Exercises     Assessment/Plan    PT Assessment Patient needs continued PT services  PT Problem List Decreased strength;Decreased mobility;Decreased activity tolerance;Decreased balance       PT Treatment Interventions DME instruction;Therapeutic exercise;Balance training;Gait training;Stair training;Neuromuscular re-education;Functional mobility training;Therapeutic activities;Patient/family education;Manual techniques    PT Goals (Current goals can be found in the Care Plan section)  Acute Rehab PT Goals Patient Stated Goal: Return home PT Goal Formulation:  With patient Time For Goal Achievement: 04/21/22 Potential to Achieve Goals: Good    Frequency Min 3X/week     Co-evaluation               AM-PAC PT "6 Clicks" Mobility  Outcome Measure Help needed turning from your back to your side while in a flat bed without using bedrails?: A Little Help needed moving from lying on your back to sitting on the side of a flat bed without using bedrails?: A Little Help needed moving to and from a bed to a chair (including a wheelchair)?: A Lot Help needed standing up from a chair using your arms (e.g., wheelchair or bedside chair)?: A Lot Help needed to walk in hospital room?: A Lot Help needed climbing 3-5 steps with a railing? : A Lot 6 Click Score: 14    End of Session Equipment Utilized During Treatment: Gait belt Activity Tolerance: Patient limited by fatigue Patient left: with call bell/phone within reach;with nursing/sitter in room;in chair Nurse Communication: Mobility status PT Visit Diagnosis: Unsteadiness on feet (R26.81);Other abnormalities of gait and mobility (R26.89);Muscle weakness (generalized) (M62.81)    Time: 9211-9417 PT Time Calculation (min) (ACUTE ONLY): 26 min   Charges:   PT Evaluation $PT Eval Low Complexity: 1 Low PT Treatments $Therapeutic Activity: 8-22 mins       10:31 AM, 04/07/22 Mearl Latin PT, DPT Physical Therapist at [facility]

## 2022-04-07 NOTE — Progress Notes (Signed)
Pt was brought to unit with stool on her. 300 Nursing staff and charge assisted Pt with ADL's. Pt was washed up and new gown and new female external cath placed with mesh underwear to hold in place. Mepilex Border was placed on Sacrum due to redness to buttocks. . Pt was toileted, and but on BSC. Pt had BM.Yellow and red arm bands placed, Grip socks, pt has call bell in reach with bed alarm on. Bed locked in in lowest prosthion for safety . and fall mats placed. Will continue to monitor,

## 2022-04-08 DIAGNOSIS — F10930 Alcohol use, unspecified with withdrawal, uncomplicated: Secondary | ICD-10-CM | POA: Diagnosis not present

## 2022-04-08 DIAGNOSIS — F1120 Opioid dependence, uncomplicated: Secondary | ICD-10-CM

## 2022-04-08 DIAGNOSIS — R7401 Elevation of levels of liver transaminase levels: Secondary | ICD-10-CM | POA: Diagnosis not present

## 2022-04-08 DIAGNOSIS — E872 Acidosis, unspecified: Secondary | ICD-10-CM | POA: Diagnosis not present

## 2022-04-08 LAB — COMPREHENSIVE METABOLIC PANEL
ALT: 169 U/L — ABNORMAL HIGH (ref 0–44)
AST: 251 U/L — ABNORMAL HIGH (ref 15–41)
Albumin: 3.5 g/dL (ref 3.5–5.0)
Alkaline Phosphatase: 86 U/L (ref 38–126)
Anion gap: 11 (ref 5–15)
BUN: 9 mg/dL (ref 8–23)
CO2: 27 mmol/L (ref 22–32)
Calcium: 8.8 mg/dL — ABNORMAL LOW (ref 8.9–10.3)
Chloride: 96 mmol/L — ABNORMAL LOW (ref 98–111)
Creatinine, Ser: 0.67 mg/dL (ref 0.44–1.00)
GFR, Estimated: >60 mL/min (ref >60)
Glucose, Bld: 123 mg/dL — ABNORMAL HIGH (ref 70–99)
Potassium: 3.2 mmol/L — ABNORMAL LOW (ref 3.5–5.1)
Sodium: 134 mmol/L — ABNORMAL LOW (ref 135–145)
Total Bilirubin: 1.2 mg/dL (ref 0.3–1.2)
Total Protein: 6.5 g/dL (ref 6.5–8.1)

## 2022-04-08 LAB — CBC
HCT: 32.5 % — ABNORMAL LOW (ref 36.0–46.0)
Hemoglobin: 10.3 g/dL — ABNORMAL LOW (ref 12.0–15.0)
MCH: 25.8 pg — ABNORMAL LOW (ref 26.0–34.0)
MCHC: 31.7 g/dL (ref 30.0–36.0)
MCV: 81.3 fL (ref 80.0–100.0)
Platelets: 222 10*3/uL (ref 150–400)
RBC: 4 MIL/uL (ref 3.87–5.11)
RDW: 18.5 % — ABNORMAL HIGH (ref 11.5–15.5)
WBC: 5.2 10*3/uL (ref 4.0–10.5)
nRBC: 0 % (ref 0.0–0.2)

## 2022-04-08 LAB — FOLATE: Folate: >40 ng/mL (ref >5.9)

## 2022-04-08 LAB — MAGNESIUM: Magnesium: 1.8 mg/dL (ref 1.7–2.4)

## 2022-04-08 LAB — VITAMIN B12: Vitamin B-12: 531 pg/mL (ref 180–914)

## 2022-04-08 LAB — T4, FREE: Free T4: 1.15 ng/dL — ABNORMAL HIGH (ref 0.61–1.12)

## 2022-04-08 LAB — TSH: TSH: 7.707 u[IU]/mL — ABNORMAL HIGH (ref 0.350–4.500)

## 2022-04-08 MED ORDER — POTASSIUM CHLORIDE IN NACL 40-0.9 MEQ/L-% IV SOLN: INTRAVENOUS | Status: AC

## 2022-04-08 MED ORDER — PROCHLORPERAZINE EDISYLATE 10 MG/2ML IJ SOLN
5.0000 mg | Freq: Four times a day (QID) | INTRAMUSCULAR | Status: DC | PRN
  Administered 2022-04-09 (×2): 5 mg via INTRAVENOUS
  Filled 2022-04-08 (×2): qty 2

## 2022-04-08 MED ORDER — ONDANSETRON HCL 4 MG/2ML IJ SOLN
4.0000 mg | Freq: Four times a day (QID) | INTRAMUSCULAR | Status: DC
  Administered 2022-04-08 – 2022-04-11 (×11): 4 mg via INTRAVENOUS
  Filled 2022-04-08 (×11): qty 2

## 2022-04-08 MED ORDER — PANTOPRAZOLE SODIUM 40 MG IV SOLR
40.0000 mg | Freq: Two times a day (BID) | INTRAVENOUS | Status: DC
  Administered 2022-04-08 – 2022-04-11 (×6): 40 mg via INTRAVENOUS
  Filled 2022-04-08 (×6): qty 10

## 2022-04-08 NOTE — Hospital Course (Signed)
71 year old female with history of alcohol dependence, COPD, hypertension, hypothyroidism, opioid dependence presenting from home secondary to generalized weakness and difficulty ambulation as well as to alcohol withdrawal symptoms ("feeling jittery inside").  Patient states that she drinks half of 1/5 of vodka on a daily basis.  Her last drink was 04/05/2022.  She states that she has chronic back pain and drinks vodka to help relieve the pain.  In addition, she states that nobody has been willing to prescribe her tramadol.  She has been having some intermittent nausea and vomiting.  She denies any fevers, chills, diarrhea, hematochezia, melena, hematemesis. In the ED, the patient was afebrile and hemodynamically stable with tachycardia 110-120.  Lactic acid peaked 2.2.  Patient was started on IV fluids and alcohol withdrawal protocol.WBC 10.6, H/H11.0/34.8, MCV 80.0, platelets 322.  BMP showed sodium 139, potassium 3.9, chloride 96, bicarb 18, glucose 129, BUN 15, creatinine 0.87, AST 242, ALT 190, total bilirubin 1.9, magnesium 1.4, lipase 28, alcohol less than 10.  Urine drug screen was positive for benzodiazepine, lactic acid 2.2 > 1.7. Ativan was given, magnesium was replenished, Hospitalization was prolonged due of slow improvement of n/v and elevated LFTs which ultimately improved.

## 2022-04-08 NOTE — Plan of Care (Signed)
  Problem: Health Behavior/Discharge Planning: Goal: Ability to manage health-related needs will improve Outcome: Progressing   Problem: Clinical Measurements: Goal: Diagnostic test results will improve Outcome: Progressing   Problem: Clinical Measurements: Goal: Respiratory complications will improve Outcome: Progressing   Problem: Activity: Goal: Risk for activity intolerance will decrease Outcome: Progressing   Problem: Coping: Goal: Level of anxiety will decrease Outcome: Progressing

## 2022-04-08 NOTE — Progress Notes (Signed)
Patient had one episode of emesis this shift, MD Tat aware. New diet order placed. Patient tolerated clear liquid diet for lunch and dinner with no emesis reported.

## 2022-04-08 NOTE — Progress Notes (Addendum)
PROGRESS NOTE  Stacy Dalton TFT:[id number] DOB: [birth year] DOA: 04/06/2022 PCP: Pcp, No  Brief History:  71 year old female with history of alcohol dependence, COPD, hypertension, hypothyroidism, opioid dependence presenting from home secondary to generalized weakness and difficulty ambulation as well as to alcohol withdrawal symptoms ("feeling jittery inside").  Patient states that she drinks half of 1/5 of vodka on a daily basis.  Her last drink was 04/05/2022.  She states that she has chronic back pain and drinks vodka to help relieve the pain.  In addition, she states that nobody has been willing to prescribe her tramadol.  She has been having some intermittent nausea and vomiting.  She denies any fevers, chills, diarrhea, hematochezia, melena, hematemesis. In the ED, the patient was afebrile and hemodynamically stable with tachycardia 110-120.  Lactic acid peaked 2.2.  Patient was started on IV fluids and alcohol withdrawal protocol.WBC 10.6, H/H11.0/34.8, MCV 80.0, platelets 322.  BMP showed sodium 139, potassium 3.9, chloride 96, bicarb 18, glucose 129, BUN 15, creatinine 0.87, AST 242, ALT 190, total bilirubin 1.9, magnesium 1.4, lipase 28, alcohol less than 10.  Urine drug screen was positive for benzodiazepine, lactic acid 2.2 > 1.7. Ativan was given, magnesium was replenished,   Assessment/Plan: Alcohol withdrawal Continue alcohol withdrawal protocol  Intractable nausea and vomiting -Likely secondary to alcoholic gastritis Start pantoprazole Downgrade diet to clear liquids Start Zofran around-the-clock 2/27 EGD--Grade C reflux Esophagitis (no bleeding), Schatzki ring, gastritis Start IVF  Failure to thrive PT evaluation>> skilled nursing facility H06 Folic acid TSH  Lactic acidosis Sepsis ruled out Due to volume depletion and alcohol ketosis  COPD Stable on room air Has oxygen desaturation when she is sedated with Ativan Continue Breo  Hypoxia Secondary  to hypoventilation secondary to Ativan Minimize hypnotic medications Acute respiratory failure ruled out  Opioid dependence PDMP reviewed--patient receives intermittent hydrocodone and tramadol from different providers  Essential hypertension Continue amlodipine and metoprolol heart rate  Hypothyroidism Continue Synthroid  Hypomagnesemia Replete  Hypokalemia -replete -add KCl to IVF       Family Communication:   no Family at bedside  Consultants:  none  Code Status:  FULL  DVT Prophylaxis:   Lovenox   Procedures: As Listed in Progress Note Above  Antibiotics: None     Subjective: Patient is complaining of some nausea and vomiting.  She denies any abdominal pain, diarrhea, dysuria, hematuria.  She has had frontal headache.  She denies any chest pain presently.  Objective: Vitals:   04/08/22 0456 04/08/22 0616 04/08/22 0759 04/08/22 0947  BP: (!) 165/89 (!) 165/89  (!) 162/91  Pulse: 84 84  90  Resp: 14     Temp: 98.2 F (36.8 C)     TempSrc:      SpO2: (!) 89%  94%   Weight:      Height:        Intake/Output Summary (Last 24 hours) at 04/08/2022 1137 Last data filed at 04/08/2022 1120 Gross per 24 hour  Intake 540 ml  Output 501 ml  Net 39 ml   Weight change:  Exam:  General:  Pt is alert, follows commands appropriately, not in acute distress HEENT: No icterus, No thrush, No neck mass, [place]/AT Cardiovascular: RRR, S1/S2, no rubs, no gallops Respiratory: Bibasilar rales.  No wheezing.  Good air movement Abdomen: Soft/+BS, non tender, non distended, no guarding Extremities: No edema, No lymphangitis, No petechiae, No rashes, no synovitis   Data Reviewed: I  have personally reviewed following labs and imaging studies Basic Metabolic Panel: Recent Labs  Lab 04/06/22 1929 04/07/22 0504 04/08/22 0548  NA 139 137 134*  K 3.9 3.2* 3.2*  CL 96* 97* 96*  CO2 18* 24 27  GLUCOSE 129* 114* 123*  BUN '15 13 9  '$ CREATININE 0.87 0.72 0.67   CALCIUM 9.6 8.7* 8.8*  MG 1.4* 1.8 1.8  PHOS  --  3.6  --    Liver Function Tests: Recent Labs  Lab 04/06/22 1929 04/07/22 0504 04/08/22 0548  AST 242* 169* 251*  ALT 190* 142* 169*  ALKPHOS 110 86 86  BILITOT 1.9* 1.7* 1.2  PROT 8.3* 6.5 6.5  ALBUMIN 4.6 3.7 3.5   Recent Labs  Lab 04/06/22 1929  LIPASE 28   No results for input(s): "AMMONIA" in the last 168 hours. Coagulation Profile: No results for input(s): "INR", "PROTIME" in the last 168 hours. CBC: Recent Labs  Lab 04/06/22 1929 04/07/22 0504 04/08/22 0548  WBC 10.6* 7.3 5.2  NEUTROABS 9.4*  --   --   HGB 11.0* 9.6* 10.3*  HCT 34.8* 30.4* 32.5*  MCV 80.0 81.1 81.3  PLT 322 249 222   Cardiac Enzymes: No results for input(s): "CKTOTAL", "CKMB", "CKMBINDEX", "TROPONINI" in the last 168 hours. BNP: Invalid input(s): "POCBNP" CBG: Recent Labs  Lab 04/06/22 1945  GLUCAP 128*   HbA1C: No results for input(s): "HGBA1C" in the last 72 hours. Urine analysis:    Component Value Date/Time   COLORURINE YELLOW 03/24/2022 0957   APPEARANCEUR CLEAR 03/24/2022 0957   APPEARANCEUR Cloudy (A) 07/01/2021 1354   LABSPEC 1.013 03/24/2022 0957   PHURINE 5.0 03/24/2022 0957   GLUCOSEU NEGATIVE 03/24/2022 0957   HGBUR NEGATIVE 03/24/2022 0957   BILIRUBINUR NEGATIVE 03/24/2022 0957   BILIRUBINUR Negative 07/01/2021 1354   KETONESUR 5 (A) 03/24/2022 0957   PROTEINUR 30 (A) 03/24/2022 0957   UROBILINOGEN negative 07/01/2015 1541   UROBILINOGEN 0.2 10/07/2012 1928   NITRITE NEGATIVE 03/24/2022 0957   LEUKOCYTESUR SMALL (A) 03/24/2022 0957   Sepsis Labs: '@LABRCNTIP'$ (procalcitonin:4,lacticidven:4) )No results found for this or any previous visit (from the past 240 hour(s)).   Scheduled Meds:  amLODipine  10 mg Oral Daily   enoxaparin (LOVENOX) injection  40 mg Subcutaneous Q24H   fluticasone furoate-vilanterol  1 puff Inhalation Daily   And   umeclidinium bromide  1 puff Inhalation Daily   folic acid  1 mg Oral  Daily   levothyroxine  50 mcg Oral Q0600   losartan  50 mg Oral Daily   magnesium oxide  400 mg Oral Daily   metoprolol tartrate  25 mg Oral BID   multivitamin with minerals  1 tablet Oral Daily   pantoprazole  40 mg Oral BID   thiamine  100 mg Oral Daily   Or   thiamine  100 mg Intravenous Daily   Continuous Infusions:  Procedures/Studies: CT Head Wo Contrast  Result Date: 03/24/2022 CLINICAL DATA:  Mental status change, unknown cause EXAM: CT HEAD WITHOUT CONTRAST TECHNIQUE: Contiguous axial images were obtained from the base of the skull through the vertex without intravenous contrast. RADIATION DOSE REDUCTION: This exam was performed according to the departmental dose-optimization program which includes automated exposure control, adjustment of the mA and/or kV according to patient size and/or use of iterative reconstruction technique. COMPARISON:  10/18/2021 FINDINGS: Brain: No acute intracranial abnormality. Specifically, no hemorrhage, hydrocephalus, mass lesion, acute infarction, or significant intracranial injury. Vascular: No hyperdense vessel or unexpected calcification. Skull: No acute  calvarial abnormality. Sinuses/Orbits: No acute findings Other: None IMPRESSION: No acute intracranial abnormality. Electronically Signed   By: Rolm Baptise M.D.   On: 03/24/2022 11:12   DG Abdomen Acute W/Chest  Result Date: 03/18/2022 CLINICAL DATA:  Abdominal pain. EXAM: DG ABDOMEN ACUTE WITH 1 VIEW CHEST COMPARISON:  Chest x-ray dated December 17, 2021. Abdominal x-ray dated June 22, 2021. FINDINGS: There is no evidence of dilated bowel loops or free intraperitoneal air. No radiopaque calculi or other significant radiographic abnormality is seen. Heart size and mediastinal contours are within normal limits. Both lungs are clear. IMPRESSION: Negative abdominal radiographs.  No acute cardiopulmonary disease. Electronically Signed   By: Titus Dubin M.D.   On: 03/18/2022 16:05    Orson Eva,  DO  [facility]  If 7PM-7AM, please contact night-coverage [email or web address] Password [email or web address] 04/08/2022, 11:37 AM   LOS: 1 day

## 2022-04-09 DIAGNOSIS — G894 Chronic pain syndrome: Secondary | ICD-10-CM | POA: Diagnosis not present

## 2022-04-09 DIAGNOSIS — E872 Acidosis, unspecified: Secondary | ICD-10-CM | POA: Diagnosis not present

## 2022-04-09 DIAGNOSIS — E876 Hypokalemia: Secondary | ICD-10-CM | POA: Diagnosis not present

## 2022-04-09 DIAGNOSIS — F10930 Alcohol use, unspecified with withdrawal, uncomplicated: Secondary | ICD-10-CM | POA: Diagnosis not present

## 2022-04-09 LAB — COMPREHENSIVE METABOLIC PANEL
ALT: 213 U/L — ABNORMAL HIGH (ref 0–44)
AST: 262 U/L — ABNORMAL HIGH (ref 15–41)
Albumin: 3.4 g/dL — ABNORMAL LOW (ref 3.5–5.0)
Alkaline Phosphatase: 87 U/L (ref 38–126)
Anion gap: 9 (ref 5–15)
BUN: 8 mg/dL (ref 8–23)
CO2: 23 mmol/L (ref 22–32)
Calcium: 8.6 mg/dL — ABNORMAL LOW (ref 8.9–10.3)
Chloride: 102 mmol/L (ref 98–111)
Creatinine, Ser: 0.65 mg/dL (ref 0.44–1.00)
GFR, Estimated: >60 mL/min (ref >60)
Glucose, Bld: 107 mg/dL — ABNORMAL HIGH (ref 70–99)
Potassium: 3.9 mmol/L (ref 3.5–5.1)
Sodium: 134 mmol/L — ABNORMAL LOW (ref 135–145)
Total Bilirubin: 1.5 mg/dL — ABNORMAL HIGH (ref 0.3–1.2)
Total Protein: 6.1 g/dL — ABNORMAL LOW (ref 6.5–8.1)

## 2022-04-09 LAB — MAGNESIUM: Magnesium: 1.8 mg/dL (ref 1.7–2.4)

## 2022-04-09 MED ORDER — POTASSIUM CHLORIDE IN NACL 40-0.9 MEQ/L-% IV SOLN: INTRAVENOUS | Status: AC

## 2022-04-09 NOTE — NC FL2 (Signed)
[place] LEVEL OF CARE SCREENING TOOL     IDENTIFICATION  Patient Name: Stacy Dalton Birthdate: 1951/03/21 Sex: female Admission Date (Current Location): 04/06/2022  [facility] and [place] Number:  [employer] and Address:  [facility] [address] [place]      Provider Number: [id number]  Attending Physician Name and Address:  Orson Eva, MD  Relative Name and Phone Number:  Ernest Pine)   [phone]    Current Level of Care: Hospital Recommended Level of Care: [place] Prior Approval Number:    Date Approved/Denied:   PASRR Number: [id number]  Discharge Plan: SNF    Current Diagnoses: Patient Active Problem List   Diagnosis Date Noted   GERD (gastroesophageal reflux disease) 10/10/2021   Intractable nausea and vomiting 50/53/9767   Alcoholic ketoacidosis 34/19/3790   Hypertensive urgency 08/27/2021   Esophagitis 08/27/2021   Atypical chest pain 08/27/2021   Seizure-like activity ([place]) 06/06/2021   DTs (delirium tremens) ([place]) 06/06/2021   AKI (acute kidney injury) ([place]) 05/19/2021   Elevated LFTs 05/19/2021   Lactic acidosis 05/19/2021   COVID-19 virus infection 04/15/2021   Hypotensive episode 04/15/2021   Physical deconditioning    Vitamin B12 deficiency 02/04/2021   Leukocytosis 11/23/2020   Alcohol use disorder, severe, dependence ([place]) 10/08/2020   Hyponatremia 09/17/2020   Alcohol withdrawal ([place]) 09/12/2020   Moderate protein malnutrition ([place]) 08/20/2020   Elevated SGOT ([facility]) 08/19/2020   Elevated troponin 08/19/2020   Transaminitis 08/19/2020   Alcohol abuse 06/21/2020   Acute respiratory failure with hypoxia ([place]) 09/19/2018   Large bowel obstruction (HCC)    Small bowel obstruction (HCC) 07/20/2018   Hypomagnesemia 07/20/2018   Polypharmacy 02/23/2017   Hypokalemia 24/03/7352   Acute metabolic encephalopathy 29/92/4268   Anxiety 02/22/2017   Altered mental status     Pain medication agreement signed 10/10/2016   Opioid dependence ([place]) 10/10/2016   Status post colostomy takedown 04/12/2016   Chronic constipation 12/03/2015   Peritonitis with abscess of intestine ([place]) 11/29/2015   COPD exacerbation ([place]) 09/20/2015   Osteopenia 06/10/2014   Vitamin D deficiency 06/10/2014   Acquired hypothyroidism    Depression 11/18/2012   Insomnia 11/18/2012   Chronic pain syndrome 11/18/2012   HLD (hyperlipidemia) 09/01/2012   S/P colostomy ([place]) 04/19/2012   Normocytic anemia 10/15/2011   Hypertension 10/13/2011   Chronic back pain 10/13/2011   COPD (chronic obstructive pulmonary disease) ([place]) 10/09/2011    Orientation RESPIRATION BLADDER Height & Weight     Self, Time, Situation, Place  Normal Continent, External catheter Weight: 73.5 kg Height:  '5\' 1"'$  (154.9 cm)  BEHAVIORAL SYMPTOMS/MOOD NEUROLOGICAL BOWEL NUTRITION STATUS      Continent Diet (See DC summary)  AMBULATORY STATUS COMMUNICATION OF NEEDS Skin   Extensive Assist Verbally Bruising                       Personal Care Assistance Level of Assistance  Bathing, Feeding, Dressing Bathing Assistance: Limited assistance Feeding assistance: Limited assistance Dressing Assistance: Limited assistance     Functional Limitations Info  Sight, Hearing, Speech Sight Info: Adequate Hearing Info: Adequate Speech Info: Adequate    SPECIAL CARE FACTORS FREQUENCY                       Contractures Contractures Info: Not present    Additional Factors Info  Code Status, Allergies Code Status Info: Full Allergies Info: Librium, Toradol, Butalbital-apap-caffeine, Demerol,  Esgic, iodine, limonene, meperidine, pyridium           Current Medications (04/09/2022):  This is the current hospital active medication list Current Facility-Administered Medications  Medication Dose Route Frequency Provider Last Rate Last Admin   albuterol (PROVENTIL) (2.5 MG/3ML) 0.083% nebulizer solution  3 mL  3 mL Inhalation Q4H PRN Adefeso, Oladapo, DO       amLODipine (NORVASC) tablet 10 mg  10 mg Oral Daily Adefeso, Oladapo, DO   10 mg at 04/09/22 0910   enoxaparin (LOVENOX) injection 40 mg  40 mg Subcutaneous Q24H Adefeso, Oladapo, DO   40 mg at 04/09/22 0910   fluticasone furoate-vilanterol (BREO ELLIPTA) 200-25 MCG/ACT 1 puff  1 puff Inhalation Daily Adefeso, Oladapo, DO   1 puff at 04/09/22 0719   And   umeclidinium bromide (INCRUSE ELLIPTA) 62.5 MCG/ACT 1 puff  1 puff Inhalation Daily Adefeso, Oladapo, DO   1 puff at 28/41/32 4401   folic acid (FOLVITE) tablet 1 mg  1 mg Oral Daily Adefeso, Oladapo, DO   1 mg at 04/09/22 0910   levothyroxine (SYNTHROID) tablet 50 mcg  50 mcg Oral Q0600 Adefeso, Oladapo, DO   50 mcg at 04/09/22 0526   LORazepam (ATIVAN) tablet 1-4 mg  1-4 mg Oral Q1H PRN Adefeso, Oladapo, DO   1 mg at 04/09/22 0272   Or   LORazepam (ATIVAN) injection 1-4 mg  1-4 mg Intravenous Q1H PRN Adefeso, Oladapo, DO   1 mg at 04/09/22 0019   losartan (COZAAR) tablet 50 mg  50 mg Oral Daily Adefeso, Oladapo, DO   50 mg at 04/09/22 0910   magnesium oxide (MAG-OX) tablet 400 mg  400 mg Oral Daily Adefeso, Oladapo, DO   400 mg at 04/09/22 0909   metoprolol tartrate (LOPRESSOR) tablet 25 mg  25 mg Oral BID Adefeso, Oladapo, DO   25 mg at 04/09/22 0910   multivitamin with minerals tablet 1 tablet  1 tablet Oral Daily Adefeso, Oladapo, DO   1 tablet at 04/09/22 0910   ondansetron (ZOFRAN) injection 4 mg  4 mg Intravenous Q6H Orson Eva, MD   4 mg at 04/09/22 0526   pantoprazole (PROTONIX) injection 40 mg  40 mg Intravenous Therisa Doyne, MD   40 mg at 04/09/22 0910   prochlorperazine (COMPAZINE) injection 5 mg  5 mg Intravenous Q6H PRN Tat, Shanon Brow, MD   5 mg at 04/09/22 5366   thiamine (VITAMIN B1) tablet 100 mg  100 mg Oral Daily Adefeso, Oladapo, DO   100 mg at 04/09/22 4403   Or   thiamine (VITAMIN B1) injection 100 mg  100 mg Intravenous Daily Adefeso, Oladapo, DO   100 mg at 04/06/22  2056   traMADol (ULTRAM) tablet 50 mg  50 mg Oral Q6H PRN Caren Griffins, MD   50 mg at 04/09/22 0909     Discharge Medications: Please see discharge summary for a list of discharge medications.  Relevant Imaging Results:  Relevant Lab Results:   Additional Information SS [id number]  Boneta Lucks, RN

## 2022-04-09 NOTE — Plan of Care (Signed)
  Problem: Health Behavior/Discharge Planning: Goal: Ability to manage health-related needs will improve Outcome: Progressing   Problem: Clinical Measurements: Goal: Ability to maintain clinical measurements within normal limits will improve Outcome: Progressing   Problem: Clinical Measurements: Goal: Diagnostic test results will improve Outcome: Progressing   Problem: Activity: Goal: Risk for activity intolerance will decrease Outcome: Progressing   Problem: Coping: Goal: Level of anxiety will decrease Outcome: Progressing

## 2022-04-09 NOTE — Progress Notes (Addendum)
PROGRESS NOTE  Stacy Dalton UJW:[id number] DOB: 1950-08-03 DOA: 04/06/2022 PCP: Pcp, No  Brief History:  71 year old female with history of alcohol dependence, COPD, hypertension, hypothyroidism, opioid dependence presenting from home secondary to generalized weakness and difficulty ambulation as well as to alcohol withdrawal symptoms ("feeling jittery inside").  Patient states that she drinks half of 1/5 of vodka on a daily basis.  Her last drink was 04/05/2022.  She states that she has chronic back pain and drinks vodka to help relieve the pain.  In addition, she states that nobody has been willing to prescribe her tramadol.  She has been having some intermittent nausea and vomiting.  She denies any fevers, chills, diarrhea, hematochezia, melena, hematemesis. In the ED, the patient was afebrile and hemodynamically stable with tachycardia 110-120.  Lactic acid peaked 2.2.  Patient was started on IV fluids and alcohol withdrawal protocol.WBC 10.6, H/H11.0/34.8, MCV 80.0, platelets 322.  BMP showed sodium 139, potassium 3.9, chloride 96, bicarb 18, glucose 129, BUN 15, creatinine 0.87, AST 242, ALT 190, total bilirubin 1.9, magnesium 1.4, lipase 28, alcohol less than 10.  Urine drug screen was positive for benzodiazepine, lactic acid 2.2 > 1.7. Ativan was given, magnesium was replenished,   Assessment/Plan: Alcohol withdrawal Continue alcohol withdrawal protocol   Intractable nausea and vomiting -Likely secondary to alcoholic gastritis Start pantoprazole Downgrade diet to clear liquids>>full liquids Continue Zofran around-the-clock 2/27 EGD--Grade C reflux Esophagitis (no bleeding), Schatzki ring, gastritis Continue IVF   Failure to thrive PT evaluation>> skilled nursing facility F62--130 Folic acid-- >86 VHQ--4.696, FT4--1.15   Lactic acidosis Sepsis ruled out Due to volume depletion and alcohol ketosis   COPD Stable on room air Has oxygen desaturation when she is  sedated with Ativan Continue Breo   Hypoxia Secondary to hypoventilation secondary to Ativan Minimize hypnotic medications Acute respiratory failure ruled out Now stable on RA   Opioid dependence PDMP reviewed--patient receives intermittent hydrocodone and tramadol from different providers   Essential hypertension Continue amlodipine and metoprolol heart rate   Hypothyroidism Continue Synthroid   Hypomagnesemia Replete   Hypokalemia -replete -add KCl to IVF  Transaminasemia -RUQ ultrasound              Subjective: Patient denies fevers, chills, headache, chest pain, dyspnea, nausea, vomiting, diarrhea, abdominal pain, dysuria, hematuria, hematochezia, and melena.   Objective: Vitals:   04/09/22 0613 04/09/22 0719 04/09/22 0904 04/09/22 1317  BP: (!) 151/82  (!) 164/88 (!) 140/76  Pulse: 78  87 70  Resp: '14  14 19  '$ Temp: 98.5 F (36.9 C)  98.5 F (36.9 C) (!) 97.5 F (36.4 C)  TempSrc:      SpO2: 91% 91% 93% 93%  Weight:      Height:        Intake/Output Summary (Last 24 hours) at 04/09/2022 1608 Last data filed at 04/09/2022 1530 Gross per 24 hour  Intake 1983.28 ml  Output 1400 ml  Net 583.28 ml   Weight change:  Exam:  General:  Pt is alert, follows commands appropriately, not in acute distress HEENT: No icterus, No thrush, No neck mass, [place]/AT Cardiovascular: RRR, S1/S2, no rubs, no gallops Respiratory: CTA bilaterally, no wheezing, no crackles, no rhonchi Abdomen: Soft/+BS, non tender, non distended, no guarding Extremities: No edema, No lymphangitis, No petechiae, No rashes, no synovitis   Data Reviewed: I have personally reviewed following labs and imaging studies Basic Metabolic Panel: Recent Labs  Lab 04/06/22 1929  04/07/22 0504 04/08/22 0548 04/09/22 0528  NA 139 137 134* 134*  K 3.9 3.2* 3.2* 3.9  CL 96* 97* 96* 102  CO2 18* '24 27 23  '$ GLUCOSE 129* 114* 123* 107*  BUN '15 13 9 8  '$ CREATININE 0.87 0.72 0.67 0.65  CALCIUM 9.6  8.7* 8.8* 8.6*  MG 1.4* 1.8 1.8 1.8  PHOS  --  3.6  --   --    Liver Function Tests: Recent Labs  Lab 04/06/22 1929 04/07/22 0504 04/08/22 0548 04/09/22 0528  AST 242* 169* 251* 262*  ALT 190* 142* 169* 213*  ALKPHOS 110 86 86 87  BILITOT 1.9* 1.7* 1.2 1.5*  PROT 8.3* 6.5 6.5 6.1*  ALBUMIN 4.6 3.7 3.5 3.4*   Recent Labs  Lab 04/06/22 1929  LIPASE 28   No results for input(s): "AMMONIA" in the last 168 hours. Coagulation Profile: No results for input(s): "INR", "PROTIME" in the last 168 hours. CBC: Recent Labs  Lab 04/06/22 1929 04/07/22 0504 04/08/22 0548  WBC 10.6* 7.3 5.2  NEUTROABS 9.4*  --   --   HGB 11.0* 9.6* 10.3*  HCT 34.8* 30.4* 32.5*  MCV 80.0 81.1 81.3  PLT 322 249 222   Cardiac Enzymes: No results for input(s): "CKTOTAL", "CKMB", "CKMBINDEX", "TROPONINI" in the last 168 hours. BNP: Invalid input(s): "POCBNP" CBG: Recent Labs  Lab 04/06/22 1945  GLUCAP 128*   HbA1C: No results for input(s): "HGBA1C" in the last 72 hours. Urine analysis:    Component Value Date/Time   COLORURINE YELLOW 03/24/2022 0957   APPEARANCEUR CLEAR 03/24/2022 0957   APPEARANCEUR Cloudy (A) 07/01/2021 1354   LABSPEC 1.013 03/24/2022 0957   PHURINE 5.0 03/24/2022 0957   GLUCOSEU NEGATIVE 03/24/2022 0957   HGBUR NEGATIVE 03/24/2022 0957   BILIRUBINUR NEGATIVE 03/24/2022 0957   BILIRUBINUR Negative 07/01/2021 1354   KETONESUR 5 (A) 03/24/2022 0957   PROTEINUR 30 (A) 03/24/2022 0957   UROBILINOGEN negative 07/01/2015 1541   UROBILINOGEN 0.2 10/07/2012 1928   NITRITE NEGATIVE 03/24/2022 0957   LEUKOCYTESUR SMALL (A) 03/24/2022 0957   Sepsis Labs: '@LABRCNTIP'$ (procalcitonin:4,lacticidven:4) )No results found for this or any previous visit (from the past 240 hour(s)).   Scheduled Meds:  amLODipine  10 mg Oral Daily   enoxaparin (LOVENOX) injection  40 mg Subcutaneous Q24H   fluticasone furoate-vilanterol  1 puff Inhalation Daily   And   umeclidinium bromide  1 puff  Inhalation Daily   folic acid  1 mg Oral Daily   levothyroxine  50 mcg Oral Q0600   losartan  50 mg Oral Daily   magnesium oxide  400 mg Oral Daily   metoprolol tartrate  25 mg Oral BID   multivitamin with minerals  1 tablet Oral Daily   ondansetron (ZOFRAN) IV  4 mg Intravenous Q6H   pantoprazole (PROTONIX) IV  40 mg Intravenous Q12H   thiamine  100 mg Oral Daily   Or   thiamine  100 mg Intravenous Daily   Continuous Infusions:  Procedures/Studies: CT Head Wo Contrast  Result Date: 03/24/2022 CLINICAL DATA:  Mental status change, unknown cause EXAM: CT HEAD WITHOUT CONTRAST TECHNIQUE: Contiguous axial images were obtained from the base of the skull through the vertex without intravenous contrast. RADIATION DOSE REDUCTION: This exam was performed according to the departmental dose-optimization program which includes automated exposure control, adjustment of the mA and/or kV according to patient size and/or use of iterative reconstruction technique. COMPARISON:  10/18/2021 FINDINGS: Brain: No acute intracranial abnormality. Specifically, no hemorrhage, hydrocephalus, mass  lesion, acute infarction, or significant intracranial injury. Vascular: No hyperdense vessel or unexpected calcification. Skull: No acute calvarial abnormality. Sinuses/Orbits: No acute findings Other: None IMPRESSION: No acute intracranial abnormality. Electronically Signed   By: Rolm Baptise M.D.   On: 03/24/2022 11:12   DG Abdomen Acute W/Chest  Result Date: 03/18/2022 CLINICAL DATA:  Abdominal pain. EXAM: DG ABDOMEN ACUTE WITH 1 VIEW CHEST COMPARISON:  Chest x-ray dated December 17, 2021. Abdominal x-ray dated June 22, 2021. FINDINGS: There is no evidence of dilated bowel loops or free intraperitoneal air. No radiopaque calculi or other significant radiographic abnormality is seen. Heart size and mediastinal contours are within normal limits. Both lungs are clear. IMPRESSION: Negative abdominal radiographs.  No acute  cardiopulmonary disease. Electronically Signed   By: Titus Dubin M.D.   On: 03/18/2022 16:05    Orson Eva, DO  [facility]  If 7PM-7AM, please contact night-coverage [email or web address] Password [email or web address] 04/09/2022, 4:08 PM   LOS: 2 days

## 2022-04-09 NOTE — TOC Initial Note (Signed)
Transition of Care [facility]) - Initial/Assessment Note    Patient Details  Name: Stacy Dalton MRN: [id number] Date of Birth: 1951-05-15  Transition of Care [facility]) CM/SW Contact:    Boneta Lucks, RN Phone Number: 04/09/2022, 2:25 PM  Clinical Narrative:           Patient admitted with alcohol withdrawal. PT is recommending SNF, patient with agreeable. CM sending out FL2 for bed offers. Patient was at [facility] in March and has used  [facility] home health this year.  TOC to follow.    Expected Discharge Plan: [place] Barriers to Discharge: Continued Medical Work up   Patient Goals and CMS Choice Patient states their goals for this hospitalization and ongoing recovery are:: agreeable to [facility] CMS Medicare.gov Compare Post Acute Care list provided to:: Patient    Expected Discharge Plan and Services Expected Discharge Plan: [place]         Prior Living Arrangements/Services     Patient language and need for interpreter reviewed:: Yes        Need for Family Participation in Patient Care: Yes (Comment) Care giver support system in place?: Yes (comment)   Criminal Activity/Legal Involvement Pertinent to Current Situation/Hospitalization: No - Comment as needed  Activities of Daily Living Home Assistive Devices/Equipment: Walker (specify type), Contact lenses, Dentures (specify type) (rollling walker) ADL Screening (condition at time of admission) Patient's cognitive ability adequate to safely complete daily activities?: Yes Is the patient deaf or have difficulty hearing?: No Does the patient have difficulty seeing, even when wearing glasses/contacts?: No Does the patient have difficulty concentrating, remembering, or making decisions?: Yes Patient able to express need for assistance with ADLs?: Yes Does the patient have difficulty dressing or bathing?: No Independently performs ADLs?: Yes (appropriate for developmental age) Does the  patient have difficulty walking or climbing stairs?: Yes Weakness of Legs: Both Weakness of Arms/Hands: Both  Permission Sought/Granted      Emotional Assessment       Orientation: : Oriented to Self, Oriented to Place, Oriented to  Time, Oriented to Situation Alcohol / Substance Use: Alcohol Use Psych Involvement: No (comment)  Admission diagnosis:  Alcohol withdrawal ([place]) [F10.939] Alcohol withdrawal syndrome without complication ([place]) [E70.350] Patient Active Problem List   Diagnosis Date Noted   GERD (gastroesophageal reflux disease) 10/10/2021   Intractable nausea and vomiting 09/38/1829   Alcoholic ketoacidosis 93/71/6967   Hypertensive urgency 08/27/2021   Esophagitis 08/27/2021   Atypical chest pain 08/27/2021   Seizure-like activity ([place]) 06/06/2021   DTs (delirium tremens) ([place]) 06/06/2021   AKI (acute kidney injury) ([place]) 05/19/2021   Elevated LFTs 05/19/2021   Lactic acidosis 05/19/2021   COVID-19 virus infection 04/15/2021    Class: Acute   Hypotensive episode 04/15/2021    Class: Acute   Physical deconditioning    Vitamin B12 deficiency 02/04/2021   Leukocytosis 11/23/2020   Alcohol use disorder, severe, dependence ([place]) 10/08/2020   Hyponatremia 09/17/2020   Alcohol withdrawal ([place]) 09/12/2020   Moderate protein malnutrition ([place]) 08/20/2020   Elevated SGOT (AST) 08/19/2020   Elevated troponin 08/19/2020   Transaminitis 08/19/2020   Alcohol abuse 06/21/2020   Acute respiratory failure with hypoxia ([place]) 09/19/2018   Large bowel obstruction (HCC)    Small bowel obstruction ([place]) 07/20/2018   Hypomagnesemia 07/20/2018   Polypharmacy 02/23/2017   Hypokalemia 89/38/1017   Acute metabolic encephalopathy 51/08/5850   Anxiety 02/22/2017   Altered mental status    Pain medication agreement signed 10/10/2016   Opioid  dependence ([place]) 10/10/2016   Status post colostomy takedown 04/12/2016   Chronic constipation 12/03/2015   Peritonitis with abscess of  intestine ([place]) 11/29/2015   COPD exacerbation ([place]) 09/20/2015   Osteopenia 06/10/2014   Vitamin D deficiency 06/10/2014   Acquired hypothyroidism    Depression 11/18/2012   Insomnia 11/18/2012   Chronic pain syndrome 11/18/2012   HLD (hyperlipidemia) 09/01/2012   S/P colostomy ([place]) 04/19/2012   Normocytic anemia 10/15/2011   Hypertension 10/13/2011   Chronic back pain 10/13/2011   COPD (chronic obstructive pulmonary disease) ([place]) 10/09/2011   PCP:  Pcp, No Pharmacy:   [facility] [phone] - [place], [place] [address] [place] [address] Phone: [phone] Fax: [phone]   Readmission Risk Interventions    03/27/2022   10:41 AM 10/10/2021    9:08 AM 09/19/2021    2:18 PM  Readmission Risk Prevention Plan  Transportation Screening Complete Complete Complete  Medication Review Press photographer) Complete Complete Complete  PCP or Specialist appointment within 3-5 days of discharge   Not Complete  HRI or [place] Complete Complete Complete  SW Recovery Care/Counseling Consult Complete Complete   Palliative Care Screening Not Applicable Not [place] Not Applicable Not Applicable

## 2022-04-10 ENCOUNTER — Inpatient Hospital Stay (HOSPITAL_COMMUNITY): Payer: Medicare Other

## 2022-04-10 DIAGNOSIS — G894 Chronic pain syndrome: Secondary | ICD-10-CM | POA: Diagnosis not present

## 2022-04-10 DIAGNOSIS — E872 Acidosis, unspecified: Secondary | ICD-10-CM | POA: Diagnosis not present

## 2022-04-10 DIAGNOSIS — F1120 Opioid dependence, uncomplicated: Secondary | ICD-10-CM | POA: Diagnosis not present

## 2022-04-10 DIAGNOSIS — E876 Hypokalemia: Secondary | ICD-10-CM | POA: Diagnosis not present

## 2022-04-10 LAB — COMPREHENSIVE METABOLIC PANEL
ALT: 257 U/L — ABNORMAL HIGH (ref 0–44)
AST: 303 U/L — ABNORMAL HIGH (ref 15–41)
Albumin: 3.3 g/dL — ABNORMAL LOW (ref 3.5–5.0)
Alkaline Phosphatase: 89 U/L (ref 38–126)
Anion gap: 8 (ref 5–15)
BUN: 7 mg/dL — ABNORMAL LOW (ref 8–23)
CO2: 23 mmol/L (ref 22–32)
Calcium: 8.7 mg/dL — ABNORMAL LOW (ref 8.9–10.3)
Chloride: 104 mmol/L (ref 98–111)
Creatinine, Ser: 0.69 mg/dL (ref 0.44–1.00)
GFR, Estimated: >60 mL/min (ref >60)
Glucose, Bld: 108 mg/dL — ABNORMAL HIGH (ref 70–99)
Potassium: 4.2 mmol/L (ref 3.5–5.1)
Sodium: 135 mmol/L (ref 135–145)
Total Bilirubin: 1.4 mg/dL — ABNORMAL HIGH (ref 0.3–1.2)
Total Protein: 6.1 g/dL — ABNORMAL LOW (ref 6.5–8.1)

## 2022-04-10 LAB — CBC
HCT: 31.6 % — ABNORMAL LOW (ref 36.0–46.0)
Hemoglobin: 9.8 g/dL — ABNORMAL LOW (ref 12.0–15.0)
MCH: 25.7 pg — ABNORMAL LOW (ref 26.0–34.0)
MCHC: 31 g/dL (ref 30.0–36.0)
MCV: 82.9 fL (ref 80.0–100.0)
Platelets: 190 10*3/uL (ref 150–400)
RBC: 3.81 MIL/uL — ABNORMAL LOW (ref 3.87–5.11)
RDW: 18.5 % — ABNORMAL HIGH (ref 11.5–15.5)
WBC: 4.4 10*3/uL (ref 4.0–10.5)
nRBC: 0 % (ref 0.0–0.2)

## 2022-04-10 MED ORDER — SODIUM CHLORIDE 0.9 % IV SOLN: INTRAVENOUS | Status: DC

## 2022-04-10 MED ORDER — CLONAZEPAM 0.5 MG PO TABS
0.5000 mg | ORAL_TABLET | Freq: Two times a day (BID) | ORAL | Status: DC
  Administered 2022-04-10: 0.5 mg via ORAL
  Filled 2022-04-10: qty 1

## 2022-04-10 MED ORDER — CLONAZEPAM 0.25 MG PO TBDP
0.2500 mg | ORAL_TABLET | Freq: Two times a day (BID) | ORAL | Status: DC
  Administered 2022-04-10 – 2022-04-11 (×2): 0.25 mg via ORAL
  Filled 2022-04-10 (×2): qty 1

## 2022-04-10 NOTE — Plan of Care (Signed)
  Problem: Activity: Goal: Risk for activity intolerance will decrease Outcome: Not Progressing   Problem: Coping: Goal: Level of anxiety will decrease Outcome: Not Progressing   Problem: Pain Managment: Goal: General experience of comfort will improve Outcome: Not Progressing   

## 2022-04-10 NOTE — TOC Progression Note (Signed)
Transition of Care [facility]) - Progression Note    Patient Details  Name: Stacy Dalton MRN: [id number] Date of Birth: 1951/02/17  [facility]) CM/SW Contact  Boneta Lucks, RN Phone Number: 04/10/2022, 12:59 PM  Clinical Narrative:   Bed offers reviewed with patient, she is requesting [facility]. Explained it is now [facility], she is agreeable. Updated Debbie with DC plan.    Expected Discharge Plan: Skilled Nursing Facility Barriers to Discharge: Continued Medical Work up  Expected Discharge Plan and Services Expected Discharge Plan: [place]      Readmission Risk Interventions    03/27/2022   10:41 AM 10/10/2021    9:08 AM 09/19/2021    2:18 PM  Readmission Risk Prevention Plan  Transportation Screening Complete Complete Complete  Medication Review (RN Care Manager) Complete Complete Complete  PCP or Specialist appointment within 3-5 days of discharge   Not Complete  HRI or [place] Complete Complete Complete  SW Recovery Care/Counseling Consult Complete Complete   Palliative Care Screening Not Applicable Not [place] Not Applicable Not Applicable

## 2022-04-10 NOTE — Progress Notes (Signed)
PROGRESS NOTE  Stacy Dalton TFT:[id number] DOB: [birth year] DOA: 04/06/2022 PCP: Pcp, No  Brief History:  71 year old female with history of alcohol dependence, COPD, hypertension, hypothyroidism, opioid dependence presenting from home secondary to generalized weakness and difficulty ambulation as well as to alcohol withdrawal symptoms ("feeling jittery inside").  Patient states that she drinks half of 1/5 of vodka on a daily basis.  Her last drink was 04/05/2022.  She states that she has chronic back pain and drinks vodka to help relieve the pain.  In addition, she states that nobody has been willing to prescribe her tramadol.  She has been having some intermittent nausea and vomiting.  She denies any fevers, chills, diarrhea, hematochezia, melena, hematemesis. In the ED, the patient was afebrile and hemodynamically stable with tachycardia 110-120.  Lactic acid peaked 2.2.  Patient was started on IV fluids and alcohol withdrawal protocol.WBC 10.6, H/H11.0/34.8, MCV 80.0, platelets 322.  BMP showed sodium 139, potassium 3.9, chloride 96, bicarb 18, glucose 129, BUN 15, creatinine 0.87, AST 242, ALT 190, total bilirubin 1.9, magnesium 1.4, lipase 28, alcohol less than 10.  Urine drug screen was positive for benzodiazepine, lactic acid 2.2 > 1.7. Ativan was given, magnesium was replenished,   Assessment/Plan: Alcohol withdrawal Continue alcohol withdrawal protocol   Intractable nausea and vomiting -Likely secondary to alcoholic gastritis Start pantoprazole Downgrade diet to clear liquids>>full liquids>>soft diet Continue Zofran around-the-clock 2/27 EGD--Grade C reflux Esophagitis (no bleeding), Schatzki ring, gastritis Continue IVF   Failure to thrive PT evaluation>> skilled nursing facility H06--[id number] Folic acid-- >62 GBT--5.176, FT4--1.15   Lactic acidosis Sepsis ruled out Due to volume depletion and alcohol ketosis   COPD Stable on room air Has oxygen desaturation when  she is sedated with Ativan Continue Breo   Hypoxia Secondary to hypoventilation secondary to Ativan Minimize hypnotic medications Acute respiratory failure ruled out Now stable on RA   Opioid dependence PDMP reviewed--patient receives intermittent hydrocodone and tramadol from different providers   Essential hypertension Continue amlodipine and metoprolol tartrate   Hypothyroidism Continue Synthroid   Hypomagnesemia Repleted   Hypokalemia -repleted -added KCl to IVF   Transaminasemia -RUQ ultrasound--hepatic steatosis; no cholecystitis -check hep B and C       Family Communication:  no Family at bedside  Consultants:  none  Code Status:  FULL   DVT Prophylaxis:  [place] Lovenox   Procedures: As Listed in Progress Note Above  Antibiotics: None      Subjective: Patient denies fevers, chills, headache, chest pain, dyspnea, vomiting, diarrhea, abdominal pain, dysuria, hematuria, hematochezia, and melena.  She has some nausea Objective: Vitals:   04/09/22 2250 04/10/22 0630 04/10/22 0710 04/10/22 0835  BP: 137/67 (!) 151/82  (!) 144/79  Pulse: 75 80  82  Resp: 18 14    Temp: 98 F (36.7 C) 98.8 F (37.1 C)    TempSrc:      SpO2: 93% 93% 94%   Weight:      Height:        Intake/Output Summary (Last 24 hours) at 04/10/2022 1712 Last data filed at 04/10/2022 1423 Gross per 24 hour  Intake 2270.19 ml  Output 1200 ml  Net 1070.19 ml   Weight change:  Exam:  General:  Pt is alert, follows commands appropriately, not in acute distress HEENT: No icterus, No thrush, No neck mass, [place]/AT Cardiovascular: RRR, S1/S2, no rubs, no gallops Respiratory: bibasilar rales. No wheeze Abdomen: Soft/+BS, non tender, non distended,  no guarding Extremities: No edema, No lymphangitis, No petechiae, No rashes, no synovitis   Data Reviewed: I have personally reviewed following labs and imaging studies Basic Metabolic Panel: Recent Labs  Lab 04/06/22 1929  04/07/22 0504 04/08/22 0548 04/09/22 0528 04/10/22 0514  NA 139 137 134* 134* 135  K 3.9 3.2* 3.2* 3.9 4.2  CL 96* 97* 96* 102 104  CO2 18* '24 27 23 23  '$ GLUCOSE 129* 114* 123* 107* 108*  BUN '15 13 9 8 '$ 7*  CREATININE 0.87 0.72 0.67 0.65 0.69  CALCIUM 9.6 8.7* 8.8* 8.6* 8.7*  MG 1.4* 1.8 1.8 1.8  --   PHOS  --  3.6  --   --   --    Liver Function Tests: Recent Labs  Lab 04/06/22 1929 04/07/22 0504 04/08/22 0548 04/09/22 0528 04/10/22 0514  AST 242* 169* 251* 262* 303*  ALT 190* 142* 169* 213* 257*  ALKPHOS 110 86 86 87 89  BILITOT 1.9* 1.7* 1.2 1.5* 1.4*  PROT 8.3* 6.5 6.5 6.1* 6.1*  ALBUMIN 4.6 3.7 3.5 3.4* 3.3*   Recent Labs  Lab 04/06/22 1929  LIPASE 28   No results for input(s): "AMMONIA" in the last 168 hours. Coagulation Profile: No results for input(s): "INR", "PROTIME" in the last 168 hours. CBC: Recent Labs  Lab 04/06/22 1929 04/07/22 0504 04/08/22 0548 04/10/22 0514  WBC 10.6* 7.3 5.2 4.4  NEUTROABS 9.4*  --   --   --   HGB 11.0* 9.6* 10.3* 9.8*  HCT 34.8* 30.4* 32.5* 31.6*  MCV 80.0 81.1 81.3 82.9  PLT 322 249 222 190   Cardiac Enzymes: No results for input(s): "CKTOTAL", "CKMB", "CKMBINDEX", "TROPONINI" in the last 168 hours. BNP: Invalid input(s): "POCBNP" CBG: Recent Labs  Lab 04/06/22 1945  GLUCAP 128*   HbA1C: No results for input(s): "HGBA1C" in the last 72 hours. Urine analysis:    Component Value Date/Time   COLORURINE YELLOW 03/24/2022 0957   APPEARANCEUR CLEAR 03/24/2022 0957   APPEARANCEUR Cloudy (A) 07/01/2021 1354   LABSPEC 1.013 03/24/2022 0957   PHURINE 5.0 03/24/2022 0957   GLUCOSEU NEGATIVE 03/24/2022 0957   HGBUR NEGATIVE 03/24/2022 0957   BILIRUBINUR NEGATIVE 03/24/2022 0957   BILIRUBINUR Negative 07/01/2021 1354   KETONESUR 5 (A) 03/24/2022 0957   PROTEINUR 30 (A) 03/24/2022 0957   UROBILINOGEN negative 07/01/2015 1541   UROBILINOGEN 0.2 10/07/2012 1928   NITRITE NEGATIVE 03/24/2022 0957   LEUKOCYTESUR  SMALL (A) 03/24/2022 0957   Sepsis Labs: '@LABRCNTIP'$ (procalcitonin:4,lacticidven:4) )No results found for this or any previous visit (from the past 240 hour(s)).   Scheduled Meds:  amLODipine  10 mg Oral Daily   clonazePAM  0.25 mg Oral BID   enoxaparin (LOVENOX) injection  40 mg Subcutaneous Q24H   fluticasone furoate-vilanterol  1 puff Inhalation Daily   And   umeclidinium bromide  1 puff Inhalation Daily   folic acid  1 mg Oral Daily   levothyroxine  50 mcg Oral Q0600   losartan  50 mg Oral Daily   magnesium oxide  400 mg Oral Daily   metoprolol tartrate  25 mg Oral BID   multivitamin with minerals  1 tablet Oral Daily   ondansetron (ZOFRAN) IV  4 mg Intravenous Q6H   pantoprazole (PROTONIX) IV  40 mg Intravenous Q12H   thiamine  100 mg Oral Daily   Or   thiamine  100 mg Intravenous Daily   Continuous Infusions:  sodium chloride      Procedures/Studies: US Abdomen Limited  RUQ (LIVER/GB)  Result Date: 04/10/2022 CLINICAL DATA:  Transaminasemia.  Intractable vomiting. EXAM: ULTRASOUND ABDOMEN LIMITED RIGHT UPPER QUADRANT COMPARISON:  None Available. FINDINGS: Gallbladder: No gallstones or wall thickening visualized. No sonographic Murphy sign noted by sonographer. Common bile duct: Diameter: 0.6 Liver: No focal lesion identified. Hepatic steatosis. Portal vein is patent on color Doppler imaging with normal direction of blood flow towards the liver. Other: None. IMPRESSION: 1.  No evidence of cholecystitis or cholelithiasis. 2.  Hepatic steatosis. Electronically Signed   By: Marin Roberts M.D.   On: 04/10/2022 09:35   CT Head Wo Contrast  Result Date: 03/24/2022 CLINICAL DATA:  Mental status change, unknown cause EXAM: CT HEAD WITHOUT CONTRAST TECHNIQUE: Contiguous axial images were obtained from the base of the skull through the vertex without intravenous contrast. RADIATION DOSE REDUCTION: This exam was performed according to the departmental dose-optimization program which  includes automated exposure control, adjustment of the mA and/or kV according to patient size and/or use of iterative reconstruction technique. COMPARISON:  10/18/2021 FINDINGS: Brain: No acute intracranial abnormality. Specifically, no hemorrhage, hydrocephalus, mass lesion, acute infarction, or significant intracranial injury. Vascular: No hyperdense vessel or unexpected calcification. Skull: No acute calvarial abnormality. Sinuses/Orbits: No acute findings Other: None IMPRESSION: No acute intracranial abnormality. Electronically Signed   By: Rolm Baptise M.D.   On: 03/24/2022 11:12   DG Abdomen Acute W/Chest  Result Date: 03/18/2022 CLINICAL DATA:  Abdominal pain. EXAM: DG ABDOMEN ACUTE WITH 1 VIEW CHEST COMPARISON:  Chest x-ray dated December 17, 2021. Abdominal x-ray dated June 22, 2021. FINDINGS: There is no evidence of dilated bowel loops or free intraperitoneal air. No radiopaque calculi or other significant radiographic abnormality is seen. Heart size and mediastinal contours are within normal limits. Both lungs are clear. IMPRESSION: Negative abdominal radiographs.  No acute cardiopulmonary disease. Electronically Signed   By: Titus Dubin M.D.   On: 03/18/2022 16:05    Orson Eva, DO  [facility]  If 7PM-7AM, please contact night-coverage [email or web address] Password [email or web address] 04/10/2022, 5:12 PM   LOS: 3 days

## 2022-04-11 DIAGNOSIS — F10930 Alcohol use, unspecified with withdrawal, uncomplicated: Secondary | ICD-10-CM | POA: Diagnosis not present

## 2022-04-11 DIAGNOSIS — F1120 Opioid dependence, uncomplicated: Secondary | ICD-10-CM | POA: Diagnosis not present

## 2022-04-11 DIAGNOSIS — R7401 Elevation of levels of liver transaminase levels: Secondary | ICD-10-CM | POA: Diagnosis not present

## 2022-04-11 LAB — COMPREHENSIVE METABOLIC PANEL
ALT: 238 U/L — ABNORMAL HIGH (ref 0–44)
AST: 208 U/L — ABNORMAL HIGH (ref 15–41)
Albumin: 3.4 g/dL — ABNORMAL LOW (ref 3.5–5.0)
Alkaline Phosphatase: 101 U/L (ref 38–126)
Anion gap: 9 (ref 5–15)
BUN: 8 mg/dL (ref 8–23)
CO2: 25 mmol/L (ref 22–32)
Calcium: 9 mg/dL (ref 8.9–10.3)
Chloride: 101 mmol/L (ref 98–111)
Creatinine, Ser: 0.73 mg/dL (ref 0.44–1.00)
GFR, Estimated: >60 mL/min (ref >60)
Glucose, Bld: 122 mg/dL — ABNORMAL HIGH (ref 70–99)
Potassium: 4 mmol/L (ref 3.5–5.1)
Sodium: 135 mmol/L (ref 135–145)
Total Bilirubin: 1.3 mg/dL — ABNORMAL HIGH (ref 0.3–1.2)
Total Protein: 6.5 g/dL (ref 6.5–8.1)

## 2022-04-11 LAB — MAGNESIUM: Magnesium: 1.7 mg/dL (ref 1.7–2.4)

## 2022-04-11 LAB — HEPATITIS B SURFACE ANTIGEN: Hepatitis B Surface Ag: NONREACTIVE

## 2022-04-11 LAB — HEPATITIS C ANTIBODY: HCV Ab: NONREACTIVE

## 2022-04-11 MED ORDER — TRAMADOL HCL 50 MG PO TABS: 50.0000 mg | ORAL_TABLET | Freq: Four times a day (QID) | ORAL | 0 refills | Status: DC | PRN

## 2022-04-11 NOTE — Progress Notes (Signed)
Report called to [facility]). EMS called to set up transport.

## 2022-04-11 NOTE — Care Management Important Message (Signed)
Important Message  Patient Details  Name: Stacy Dalton MRN: [id number] Date of Birth: 04-21-1951   Medicare Important Message Given:  Yes (spoke with by phone at [phone] to review letter. No additonal copy needed)     Tommy Medal 04/11/2022, 10:56 AM

## 2022-04-11 NOTE — TOC Transition Note (Signed)
Transition of Care [facility]) - CM/SW Discharge Note   Patient Details  Name: NEZIAH VOGELGESANG MRN: [id number] Date of Birth: May 03, 1951  Transition of Care [facility]) CM/SW Contact:  Boneta Lucks, RN Phone Number: 04/11/2022, 11:50 AM   Clinical Narrative:   Patient discharging to [facility], RN called report, TOC called EMS. DC summary sent in the hub.   Final next level of care: Skilled Nursing Facility Barriers to Discharge: Barriers Resolved   Patient Goals and CMS Choice Patient states their goals for this hospitalization and ongoing recovery are:: agreeable to [facility] CMS Medicare.gov Compare Post Acute Care list provided to:: Patient    Discharge Placement              Patient chooses bed at:  [facility]) Patient to be transferred to facility by: EMS Name of family member notified: Left her son - [name] a message Patient and family notified of of transfer: 04/11/22  Discharge Plan and [place]                 Readmission Risk Interventions    04/11/2022   11:48 AM 03/27/2022   10:41 AM 10/10/2021    9:08 AM  Readmission Risk Prevention Plan  Transportation Screening Complete Complete Complete  Medication Review ([place]) Complete Complete Complete  PCP or Specialist appointment within 3-5 days of discharge Complete    HRI or Home Care Consult Complete Complete Complete  SW Recovery Care/Counseling Consult Complete Complete Complete  Palliative Care Screening Not Applicable Not Applicable Not [place] Complete Not Applicable Not Applicable

## 2022-04-11 NOTE — Consult Note (Signed)
   [facility] CM Inpatient Consult   04/11/2022  ANGELLEE COHILL 1950/12/21 [id number]  [facility] [facility] [ACO] Patient:  Medicare ACO REACH  Primary Care Provider:  Pcp, No  [facility] Liaison remote coverage for [facility]   Patient screened for less than 30 days readmission hospitalization with noted extreme high risk score for unplanned readmission risk and to assess for potential [place] Management service needs for post hospital transition.  Review of patient's medical record reveals patient is to transition to a skilled nursing facility level of care.  Patient noted in encounters to have had several calls from a [place] without success.  Operator assisted call to patient's hospital room.  Spoke with patient HIPPA verified.  Explained reason for call and patient states the number in the demographics in electronic medical record was no longer a good number.  She states the best number for me is [phone] and can speak with my fiance, Runell Gess.  Information updated.  Patient states, she no longer has a PCP because she was unable to get to her appointments and "the office policy said after so many missed appointments they had to let me go."  Patient is currently for a skilled level facility level of care for transition today. Patient states she is supposed to go to [facility].   Plan:  If patient goes to [facility], a [facility] affiliated facility, this Probation officer will alert the [facility] RN of needs for PCP for post facility and follow up with [facility] for Care Coordination needs.  For questions contact:   Natividad Brood, RN BSN [place]  [phone] business mobile phone Toll free office [phone]  *[place]  [phone] Fax number:  [phone] [email or web address]

## 2022-04-11 NOTE — Discharge Summary (Addendum)
Physician Discharge Summary   Patient: Stacy Dalton MRN: [id number] DOB: Feb 15, 1951  Admit date:     04/06/2022  Discharge date: 04/11/22  Discharge Physician: Shanon Brow Toriano Aikey   PCP: Pcp, No   Recommendations at discharge:   Please follow up with primary care provider within 1-2 weeks  Please repeat BMP, LFTs and CBC in one week   Hospital Course: 71 year old female with history of alcohol dependence, COPD, hypertension, hypothyroidism, opioid dependence presenting from home secondary to generalized weakness and difficulty ambulation as well as to alcohol withdrawal symptoms ("feeling jittery inside").  Patient states that she drinks half of 1/5 of vodka on a daily basis.  Her last drink was 04/05/2022.  She states that she has chronic back pain and drinks vodka to help relieve the pain.  In addition, she states that nobody has been willing to prescribe her tramadol.  She has been having some intermittent nausea and vomiting.  She denies any fevers, chills, diarrhea, hematochezia, melena, hematemesis. In the ED, the patient was afebrile and hemodynamically stable with tachycardia 110-120.  Lactic acid peaked 2.2.  Patient was started on IV fluids and alcohol withdrawal protocol.WBC 10.6, H/H11.0/34.8, MCV 80.0, platelets 322.  BMP showed sodium 139, potassium 3.9, chloride 96, bicarb 18, glucose 129, BUN 15, creatinine 0.87, AST 242, ALT 190, total bilirubin 1.9, magnesium 1.4, lipase 28, alcohol less than 10.  Urine drug screen was positive for benzodiazepine, lactic acid 2.2 > 1.7. Ativan was given, magnesium was replenished, Hospitalization was prolonged due of slow improvement of n/v and elevated LFTs which ultimately improved.  Assessment and Plan: Alcohol withdrawal Continued alcohol withdrawal protocol Improved at time of dc   Intractable nausea and vomiting -Likely secondary to alcoholic gastritis Start pantoprazole Downgrade diet to clear liquids>>full liquids>>soft diet Continue  Zofran around-the-clock -pt gradually improved 2/27 EGD--Grade C reflux Esophagitis (no bleeding), Schatzki ring, gastritis Continued IVF Pt ultimately tolerated diet advancement   Failure to thrive PT evaluation>> skilled nursing facility F02--637 Folic acid-- >85 YIF--0.277, FT4--1.15   Lactic acidosis Sepsis ruled out Due to volume depletion and alcohol ketosis   COPD Stable on room air Has oxygen desaturation when she is sedated with Ativan Continue Breo   Hypoxia Secondary to hypoventilation secondary to Ativan Minimize hypnotic medications Acute respiratory failure ruled out Now stable on RA   Opioid dependence PDMP reviewed--patient receives intermittent hydrocodone and tramadol from different providers   Essential hypertension Continue amlodipine and metoprolol tartrate   Hypothyroidism Continue Synthroid   Hypomagnesemia Repleted   Hypokalemia -repleted -added KCl to IVF   Transaminasemia -RUQ ultrasound--hepatic steatosis; no cholecystitis -no abd pain, tolerating diet -ultimately trending down -repeat LFTs one week after d/c -check hep B and C--pending at time of d/c        Pain control - [facility] Controlled Substance Reporting System database was reviewed. and patient was instructed, not to drive, operate heavy machinery, perform activities at heights, swimming or participation in water activities or provide baby-sitting services while on Pain, Sleep and Anxiety Medications; until their outpatient Physician has advised to do so again. Also recommended to not to take more than prescribed Pain, Sleep and Anxiety Medications.  Consultants: none Procedures performed: none  Disposition: SNF Diet recommendation:  Cardiac diet DISCHARGE MEDICATION: Allergies as of 04/11/2022       Reactions   Librium [chlordiazepoxide]    Became unresponsive   Toradol [ketorolac Tromethamine] Shortness Of Breath   Butalbital-apap-caffeine Other (See  Comments)   jittery  Demerol Swelling   Esgic [butalbital-apap-caffeine] Other (See Comments)   jittery   Iodine Other (See Comments)   bp bottomed out per pt several hours later; unsure if pre medicated in past with cm; done in [place]   Limonene    Other reaction(s): Other (See Comments) jittery   Meperidine Swelling   Pyridium [phenazopyridine] Nausea Only        Medication List     STOP taking these medications    DULoxetine 60 MG capsule Commonly known as: Cymbalta   folic acid 1 MG tablet Commonly known as: FOLVITE   magnesium oxide 400 (240 Mg) MG tablet Commonly known as: MAG-OX   multivitamin with minerals Tabs tablet   ondansetron 4 MG tablet Commonly known as: ZOFRAN   sucralfate 1 GM/10ML suspension Commonly known as: CARAFATE   thiamine 100 MG tablet Commonly known as: VITAMIN B1   traZODone 100 MG tablet Commonly known as: DESYREL       TAKE these medications    albuterol 108 (90 Base) MCG/ACT inhaler Commonly known as: VENTOLIN HFA Inhale 2 puffs into the lungs every 4 (four) hours as needed for wheezing or shortness of breath.   amLODipine 10 MG tablet Commonly known as: NORVASC Take 1 tablet (10 mg total) by mouth daily.   cyanocobalamin 1000 MCG tablet Commonly known as: VITAMIN B12 Take 1 tablet (1,000 mcg total) by mouth daily.   hydrOXYzine 25 MG capsule Commonly known as: VISTARIL Take 1 capsule (25 mg total) by mouth every 4 (four) hours as needed for anxiety or nausea.   levothyroxine 50 MCG tablet Commonly known as: Synthroid Take 1 tablet (50 mcg total) by mouth daily before breakfast.   losartan 50 MG tablet Commonly known as: COZAAR Take 1 tablet (50 mg total) by mouth daily.   metoprolol tartrate 25 MG tablet Commonly known as: LOPRESSOR Take 1 tablet (25 mg total) by mouth 2 (two) times daily.   pantoprazole 40 MG tablet Commonly known as: Protonix Take 1 tablet (40 mg total) by mouth 2 (two) times daily.    traMADol 50 MG tablet Commonly known as: ULTRAM Take 1 tablet (50 mg total) by mouth every 6 (six) hours as needed for severe pain.   Trelegy Ellipta 200-62.5-25 MCG/ACT Aepb Generic drug: Fluticasone-Umeclidin-Vilant Inhale 1 puff into the lungs daily.        Contact information for after-discharge care     [facility] .   Service: Skilled Nursing Contact information: [address] [place] [phone]                    Discharge Exam: Danley Danker Weights   04/06/22 1828 04/07/22 0426  Weight: 55 kg 73.5 kg   HEENT:  [place]/AT, No thrush, no icterus CV:  RRR, no rub, no S3, no S4 Lung:  CTA, no wheeze, no rhonchi Abd:  soft/+BS, NT Ext:  No edema, no lymphangitis, no synovitis, no rash   Condition at discharge: stable  The results of significant diagnostics from this hospitalization (including imaging, microbiology, ancillary and laboratory) are listed below for reference.   Imaging Studies: US Abdomen Limited RUQ (LIVER/GB)  Result Date: 04/10/2022 CLINICAL DATA:  Transaminasemia.  Intractable vomiting. EXAM: ULTRASOUND ABDOMEN LIMITED RIGHT UPPER QUADRANT COMPARISON:  None Available. FINDINGS: Gallbladder: No gallstones or wall thickening visualized. No sonographic Murphy sign noted by sonographer. Common bile duct: Diameter: 0.6 Liver: No focal lesion identified. Hepatic steatosis.  Portal vein is patent on color Doppler imaging with normal direction of blood flow towards the liver. Other: None. IMPRESSION: 1.  No evidence of cholecystitis or cholelithiasis. 2.  Hepatic steatosis. Electronically Signed   By: Marin Roberts M.D.   On: 04/10/2022 09:35   CT Head Wo Contrast  Result Date: 03/24/2022 CLINICAL DATA:  Mental status change, unknown cause EXAM: CT HEAD WITHOUT CONTRAST TECHNIQUE: Contiguous axial images were obtained from the base of the skull through the vertex without intravenous  contrast. RADIATION DOSE REDUCTION: This exam was performed according to the departmental dose-optimization program which includes automated exposure control, adjustment of the mA and/or kV according to patient size and/or use of iterative reconstruction technique. COMPARISON:  10/18/2021 FINDINGS: Brain: No acute intracranial abnormality. Specifically, no hemorrhage, hydrocephalus, mass lesion, acute infarction, or significant intracranial injury. Vascular: No hyperdense vessel or unexpected calcification. Skull: No acute calvarial abnormality. Sinuses/Orbits: No acute findings Other: None IMPRESSION: No acute intracranial abnormality. Electronically Signed   By: Rolm Baptise M.D.   On: 03/24/2022 11:12   DG Abdomen Acute W/Chest  Result Date: 03/18/2022 CLINICAL DATA:  Abdominal pain. EXAM: DG ABDOMEN ACUTE WITH 1 VIEW CHEST COMPARISON:  Chest x-ray dated December 17, 2021. Abdominal x-ray dated June 22, 2021. FINDINGS: There is no evidence of dilated bowel loops or free intraperitoneal air. No radiopaque calculi or other significant radiographic abnormality is seen. Heart size and mediastinal contours are within normal limits. Both lungs are clear. IMPRESSION: Negative abdominal radiographs.  No acute cardiopulmonary disease. Electronically Signed   By: Titus Dubin M.D.   On: 03/18/2022 16:05    Microbiology: Results for orders placed or performed during the hospital encounter of 03/18/22  Urine Culture     Status: Abnormal   Collection Time: 03/18/22  6:20 PM   Specimen: Urine, Clean Catch  Result Value Ref Range Status   Specimen Description   Final    URINE, CLEAN CATCH Performed at [facility], [address], [place], [place] [address]    Special Requests   Final    NONE Performed at [facility], [address], [place], [place] [address]    Culture MULTIPLE SPECIES PRESENT, SUGGEST RECOLLECTION (A)  Final   Report Status 03/20/2022 FINAL  Final    Labs: CBC: Recent Labs  Lab  04/06/22 1929 04/07/22 0504 04/08/22 0548 04/10/22 0514  WBC 10.6* 7.3 5.2 4.4  NEUTROABS 9.4*  --   --   --   HGB 11.0* 9.6* 10.3* 9.8*  HCT 34.8* 30.4* 32.5* 31.6*  MCV 80.0 81.1 81.3 82.9  PLT 322 249 222 010   Basic Metabolic Panel: Recent Labs  Lab 04/06/22 1929 04/07/22 0504 04/08/22 0548 04/09/22 0528 04/10/22 0514 04/11/22 0435  NA 139 137 134* 134* 135 135  K 3.9 3.2* 3.2* 3.9 4.2 4.0  CL 96* 97* 96* 102 104 101  CO2 18* '24 27 23 23 25  '$ GLUCOSE 129* 114* 123* 107* 108* 122*  BUN '15 13 9 8 '$ 7* 8  CREATININE 0.87 0.72 0.67 0.65 0.69 0.73  CALCIUM 9.6 8.7* 8.8* 8.6* 8.7* 9.0  MG 1.4* 1.8 1.8 1.8  --  1.7  PHOS  --  3.6  --   --   --   --    Liver Function Tests: Recent Labs  Lab 04/07/22 0504 04/08/22 0548 04/09/22 0528 04/10/22 0514 04/11/22 0435  AST 169* 251* 262* 303* 208*  ALT 142* 169* 213* 257* 238*  ALKPHOS 86 86 87 89  101  BILITOT 1.7* 1.2 1.5* 1.4* 1.3*  PROT 6.5 6.5 6.1* 6.1* 6.5  ALBUMIN 3.7 3.5 3.4* 3.3* 3.4*   CBG: Recent Labs  Lab 04/06/22 1945  GLUCAP 128*    Discharge time spent: greater than 30 minutes.  Signed: Orson Eva, MD [facility] 04/11/2022

## 2022-04-12 DIAGNOSIS — E039 Hypothyroidism, unspecified: Secondary | ICD-10-CM | POA: Diagnosis not present

## 2022-04-12 DIAGNOSIS — I10 Essential (primary) hypertension: Secondary | ICD-10-CM | POA: Diagnosis not present

## 2022-04-12 DIAGNOSIS — R5381 Other malaise: Secondary | ICD-10-CM | POA: Diagnosis not present

## 2022-04-12 DIAGNOSIS — F419 Anxiety disorder, unspecified: Secondary | ICD-10-CM | POA: Diagnosis not present

## 2022-04-12 DIAGNOSIS — K219 Gastro-esophageal reflux disease without esophagitis: Secondary | ICD-10-CM | POA: Diagnosis not present

## 2022-04-12 DIAGNOSIS — F10930 Alcohol use, unspecified with withdrawal, uncomplicated: Secondary | ICD-10-CM | POA: Diagnosis not present

## 2022-04-13 NOTE — Patient Outreach (Signed)
ALYSSAMARIE MOUNSEY 1950/11/17 [id number]  Referral request:  [facility] Readmission Report for updates, disposition, follow up.  Patient's electronic medical record accessed and reviewed for information for readmission report sent.  For questions,  Natividad Brood, RN BSN [place]  [phone] business mobile phone Toll free office [phone]  *[place]  [phone] Fax number: [phone] [email or web address]

## 2022-04-14 DIAGNOSIS — K219 Gastro-esophageal reflux disease without esophagitis: Secondary | ICD-10-CM | POA: Diagnosis not present

## 2022-04-14 DIAGNOSIS — F10930 Alcohol use, unspecified with withdrawal, uncomplicated: Secondary | ICD-10-CM | POA: Diagnosis not present

## 2022-04-14 DIAGNOSIS — E039 Hypothyroidism, unspecified: Secondary | ICD-10-CM | POA: Diagnosis not present

## 2022-04-14 DIAGNOSIS — I10 Essential (primary) hypertension: Secondary | ICD-10-CM | POA: Diagnosis not present

## 2022-04-14 DIAGNOSIS — R5381 Other malaise: Secondary | ICD-10-CM | POA: Diagnosis not present

## 2022-04-14 DIAGNOSIS — F419 Anxiety disorder, unspecified: Secondary | ICD-10-CM | POA: Diagnosis not present

## 2022-04-17 DIAGNOSIS — R5381 Other malaise: Secondary | ICD-10-CM | POA: Diagnosis not present

## 2022-04-17 DIAGNOSIS — E039 Hypothyroidism, unspecified: Secondary | ICD-10-CM | POA: Diagnosis not present

## 2022-04-17 DIAGNOSIS — K219 Gastro-esophageal reflux disease without esophagitis: Secondary | ICD-10-CM | POA: Diagnosis not present

## 2022-04-17 DIAGNOSIS — I10 Essential (primary) hypertension: Secondary | ICD-10-CM | POA: Diagnosis not present

## 2022-04-17 DIAGNOSIS — F10930 Alcohol use, unspecified with withdrawal, uncomplicated: Secondary | ICD-10-CM | POA: Diagnosis not present

## 2022-04-17 DIAGNOSIS — F419 Anxiety disorder, unspecified: Secondary | ICD-10-CM | POA: Diagnosis not present

## 2022-04-18 DIAGNOSIS — I10 Essential (primary) hypertension: Secondary | ICD-10-CM | POA: Diagnosis not present

## 2022-04-18 DIAGNOSIS — F10930 Alcohol use, unspecified with withdrawal, uncomplicated: Secondary | ICD-10-CM | POA: Diagnosis not present

## 2022-04-18 DIAGNOSIS — R7401 Elevation of levels of liver transaminase levels: Secondary | ICD-10-CM | POA: Diagnosis not present

## 2022-04-18 DIAGNOSIS — E039 Hypothyroidism, unspecified: Secondary | ICD-10-CM | POA: Diagnosis not present

## 2022-04-18 DIAGNOSIS — R627 Adult failure to thrive: Secondary | ICD-10-CM | POA: Diagnosis not present

## 2022-04-18 DIAGNOSIS — F419 Anxiety disorder, unspecified: Secondary | ICD-10-CM | POA: Diagnosis not present

## 2022-04-18 DIAGNOSIS — J449 Chronic obstructive pulmonary disease, unspecified: Secondary | ICD-10-CM | POA: Diagnosis not present

## 2022-04-18 DIAGNOSIS — K2920 Alcoholic gastritis without bleeding: Secondary | ICD-10-CM | POA: Diagnosis not present

## 2022-04-19 DIAGNOSIS — I10 Essential (primary) hypertension: Secondary | ICD-10-CM | POA: Diagnosis not present

## 2022-04-19 DIAGNOSIS — J449 Chronic obstructive pulmonary disease, unspecified: Secondary | ICD-10-CM | POA: Diagnosis not present

## 2022-04-19 DIAGNOSIS — E039 Hypothyroidism, unspecified: Secondary | ICD-10-CM | POA: Diagnosis not present

## 2022-04-21 DIAGNOSIS — I10 Essential (primary) hypertension: Secondary | ICD-10-CM | POA: Diagnosis not present

## 2022-04-21 DIAGNOSIS — R5381 Other malaise: Secondary | ICD-10-CM | POA: Diagnosis not present

## 2022-04-21 DIAGNOSIS — E039 Hypothyroidism, unspecified: Secondary | ICD-10-CM | POA: Diagnosis not present

## 2022-04-21 DIAGNOSIS — F419 Anxiety disorder, unspecified: Secondary | ICD-10-CM | POA: Diagnosis not present

## 2022-04-21 DIAGNOSIS — K219 Gastro-esophageal reflux disease without esophagitis: Secondary | ICD-10-CM | POA: Diagnosis not present

## 2022-04-21 DIAGNOSIS — F10930 Alcohol use, unspecified with withdrawal, uncomplicated: Secondary | ICD-10-CM | POA: Diagnosis not present

## 2022-04-24 DIAGNOSIS — F419 Anxiety disorder, unspecified: Secondary | ICD-10-CM | POA: Diagnosis not present

## 2022-04-24 DIAGNOSIS — I10 Essential (primary) hypertension: Secondary | ICD-10-CM | POA: Diagnosis not present

## 2022-04-24 DIAGNOSIS — F10930 Alcohol use, unspecified with withdrawal, uncomplicated: Secondary | ICD-10-CM | POA: Diagnosis not present

## 2022-04-24 DIAGNOSIS — E039 Hypothyroidism, unspecified: Secondary | ICD-10-CM | POA: Diagnosis not present

## 2022-04-24 DIAGNOSIS — R5381 Other malaise: Secondary | ICD-10-CM | POA: Diagnosis not present

## 2022-04-24 DIAGNOSIS — G4700 Insomnia, unspecified: Secondary | ICD-10-CM | POA: Diagnosis not present

## 2022-04-24 DIAGNOSIS — K219 Gastro-esophageal reflux disease without esophagitis: Secondary | ICD-10-CM | POA: Diagnosis not present

## 2022-06-12 DIAGNOSIS — I10 Essential (primary) hypertension: Secondary | ICD-10-CM | POA: Diagnosis not present

## 2022-06-12 DIAGNOSIS — J209 Acute bronchitis, unspecified: Secondary | ICD-10-CM | POA: Diagnosis not present

## 2022-06-12 DIAGNOSIS — E039 Hypothyroidism, unspecified: Secondary | ICD-10-CM | POA: Diagnosis not present

## 2022-08-16 ENCOUNTER — Emergency Department (HOSPITAL_COMMUNITY)
Admission: EM | Admit: 2022-08-16 | Discharge: 2022-08-16 | Disposition: A | Discharge status: Home / Self Care | Payer: Medicare Other | Attending: Emergency Medicine | Admitting: Emergency Medicine

## 2022-08-16 ENCOUNTER — Encounter (HOSPITAL_COMMUNITY): Payer: Self-pay

## 2022-08-16 ENCOUNTER — Other Ambulatory Visit: Payer: Self-pay

## 2022-08-16 DIAGNOSIS — F10920 Alcohol use, unspecified with intoxication, uncomplicated: Secondary | ICD-10-CM

## 2022-08-16 DIAGNOSIS — F10120 Alcohol abuse with intoxication, uncomplicated: Secondary | ICD-10-CM | POA: Diagnosis not present

## 2022-08-16 NOTE — ED Notes (Signed)
Attempted to call Mr. Stacy Dalton [phone]  (pt's ex huband) to get pt ride.  No answer & voicemail hasn't been set up yet.

## 2022-08-16 NOTE — ED Notes (Signed)
Attempted to call Mr. Stacy Dalton [phone]  (pt's ex huband) to get pt ride.  No answer & voicemail hasn't been set up yet.

## 2022-08-16 NOTE — ED Notes (Signed)
Pt has been ambulating around Ed safely.

## 2022-08-16 NOTE — ED Notes (Signed)
Nurse called Maureen Chatters and other people and no one is willing to pick up pt. Pt boyfriend is intoxicated and unable to drive at this time.

## 2022-08-16 NOTE — ED Notes (Signed)
Pt ambulatory to bathroom x 1 assist

## 2022-08-16 NOTE — ED Notes (Addendum)
Attempted to call Ronald Pippins @ [phone] no answer, voicemail full.

## 2022-08-16 NOTE — ED Notes (Signed)
Attempted to call Mr. Stacy Dalton [phone]  (pt's ex huband) to get pt ride.  No answer & voicemail hasn't been set up yet.

## 2022-08-16 NOTE — ED Notes (Signed)
Called Neldon Newport @ [phone]; stated he was out of town & couldn't pick up pt.  Attempted to reach alternate contact Gerry @ [phone], no answer & voicemail full.

## 2022-08-16 NOTE — ED Notes (Signed)
Taney [phone] he stated he wasn't able to come get pt but would call around to try to find someone who could & would call the department when he has more information on who can.

## 2022-08-16 NOTE — ED Notes (Signed)
Spoke to Blanchard @ [phone] who stated he will not be coming to pick up pt.  That she needs to call someone else.

## 2022-08-16 NOTE — ED Notes (Signed)
Attempted to call [name] [phone]  (pt's ex huband) to get pt ride.  No answer & voicemail hasn't been set up yet.

## 2022-08-16 NOTE — ED Triage Notes (Signed)
Pt presents with alcohol intoxication

## 2022-08-16 NOTE — ED Provider Notes (Signed)
[place]  Provider Note  CSN: [id number] Arrival date & time: 08/16/22 [id number]  History Chief Complaint  Patient presents with   Alcohol Intoxication    Stacy Dalton is a 72 y.o. female with history of alcohol use disorder brought to the ED by EMS for alcohol intoxication from home. She is not sure who called EMS but she has no complaints and isn't sure how or why she is here.    Home Medications Prior to Admission medications   Medication Sig Start Date End Date Taking? Authorizing Provider  albuterol (VENTOLIN HFA) 108 (90 Base) MCG/ACT inhaler Inhale 2 puffs into the lungs every 4 (four) hours as needed for wheezing or shortness of breath. 03/28/22   Roxan Hockey, MD  amLODipine (NORVASC) 10 MG tablet Take 1 tablet (10 mg total) by mouth daily. 03/28/22   Roxan Hockey, MD  cyanocobalamin (VITAMIN B12) 1000 MCG tablet Take 1 tablet (1,000 mcg total) by mouth daily. Patient not taking: Reported on 04/06/2022 03/28/22   Roxan Hockey, MD  Fluticasone-Umeclidin-Vilant (TRELEGY ELLIPTA) 200-62.5-25 MCG/ACT AEPB Inhale 1 puff into the lungs daily. Patient not taking: Reported on 04/06/2022 03/28/22   Roxan Hockey, MD  hydrOXYzine (VISTARIL) 25 MG capsule Take 1 capsule (25 mg total) by mouth every 4 (four) hours as needed for anxiety or nausea. 03/28/22   Roxan Hockey, MD  levothyroxine (SYNTHROID) 50 MCG tablet Take 1 tablet (50 mcg total) by mouth daily before breakfast. 03/28/22   Denton Brick, Courage, MD  losartan (COZAAR) 50 MG tablet Take 1 tablet (50 mg total) by mouth daily. Patient not taking: Reported on 04/06/2022 03/28/22   Roxan Hockey, MD  metoprolol tartrate (LOPRESSOR) 25 MG tablet Take 1 tablet (25 mg total) by mouth 2 (two) times daily. Patient not taking: Reported on 04/06/2022 03/28/22   Roxan Hockey, MD  pantoprazole (PROTONIX) 40 MG tablet Take 1 tablet (40 mg total) by mouth 2 (two) times daily. Patient  not taking: Reported on 04/06/2022 03/28/22   Roxan Hockey, MD  traMADol (ULTRAM) 50 MG tablet Take 1 tablet (50 mg total) by mouth every 6 (six) hours as needed for severe pain. 04/11/22   Orson Eva, MD     Allergies    Librium [chlordiazepoxide], Toradol [ketorolac tromethamine], Butalbital-apap-caffeine, Demerol, Esgic [butalbital-apap-caffeine], Iodine, Limonene, Meperidine, and Pyridium [phenazopyridine]   Review of Systems   Review of Systems Please see HPI for pertinent positives and negatives  Physical Exam BP 136/82 Comment: Simultaneous filing. User may not have seen previous data.  Pulse 69 Comment: Simultaneous filing. User may not have seen previous data.  Temp 97.8 F (36.6 C)   Resp 20 Comment: Simultaneous filing. User may not have seen previous data.  Wt 59 kg   SpO2 96% Comment: Simultaneous filing. User may not have seen previous data.  BMI 24.56 kg/m   Physical Exam Vitals and nursing note reviewed.  Constitutional:      Appearance: Normal appearance.     Comments: Sleeping but arouses to verbal stimuli and subsequently alert  HENT:     Head: Normocephalic and atraumatic.     Nose: Nose normal.     Mouth/Throat:     Mouth: Mucous membranes are moist.  Eyes:     Extraocular Movements: Extraocular movements intact.     Conjunctiva/sclera: Conjunctivae normal.  Cardiovascular:     Rate and Rhythm: Normal rate.  Pulmonary:     Effort: Pulmonary effort is normal.  Breath sounds: Normal breath sounds.  Abdominal:     General: Abdomen is flat.     Palpations: Abdomen is soft.     Tenderness: There is no abdominal tenderness.  Musculoskeletal:        General: No swelling. Normal range of motion.     Cervical back: Neck supple.  Skin:    General: Skin is warm and dry.  Neurological:     General: No focal deficit present.  Psychiatric:        Mood and Affect: Mood normal.     ED Results / Procedures / Treatments    EKG None  Procedures Procedures  Medications Ordered in the ED Medications - No data to display  Initial Impression and Plan  Patient here with no complaints, unsure how she got here by EMS. Advised she lives alone so presumably she called herself. She does not think there is anyone she can call to take her home at this hour. Will reassess for dispo at the end of shift later this morning.   ED Course       MDM Rules/Calculators/A&P Medical Decision Making Problems Addressed: Alcoholic intoxication without complication [facility]): chronic illness or injury with exacerbation, progression, or side effects of treatment     Final Clinical Impression(s) / ED Diagnoses Final diagnoses:  Alcoholic intoxication without complication [facility])    Rx / DC Orders ED Discharge Orders     None        Truddie Hidden, MD 08/16/22 [phone]

## 2022-12-27 IMAGING — DX DG CHEST 1V PORT
1 series · 1 of 1 positions shown · non-contrast
Comparison: 08/21/2021

CLINICAL DATA: Pain

EXAM:
PORTABLE CHEST 1 VIEW

[chest ap]
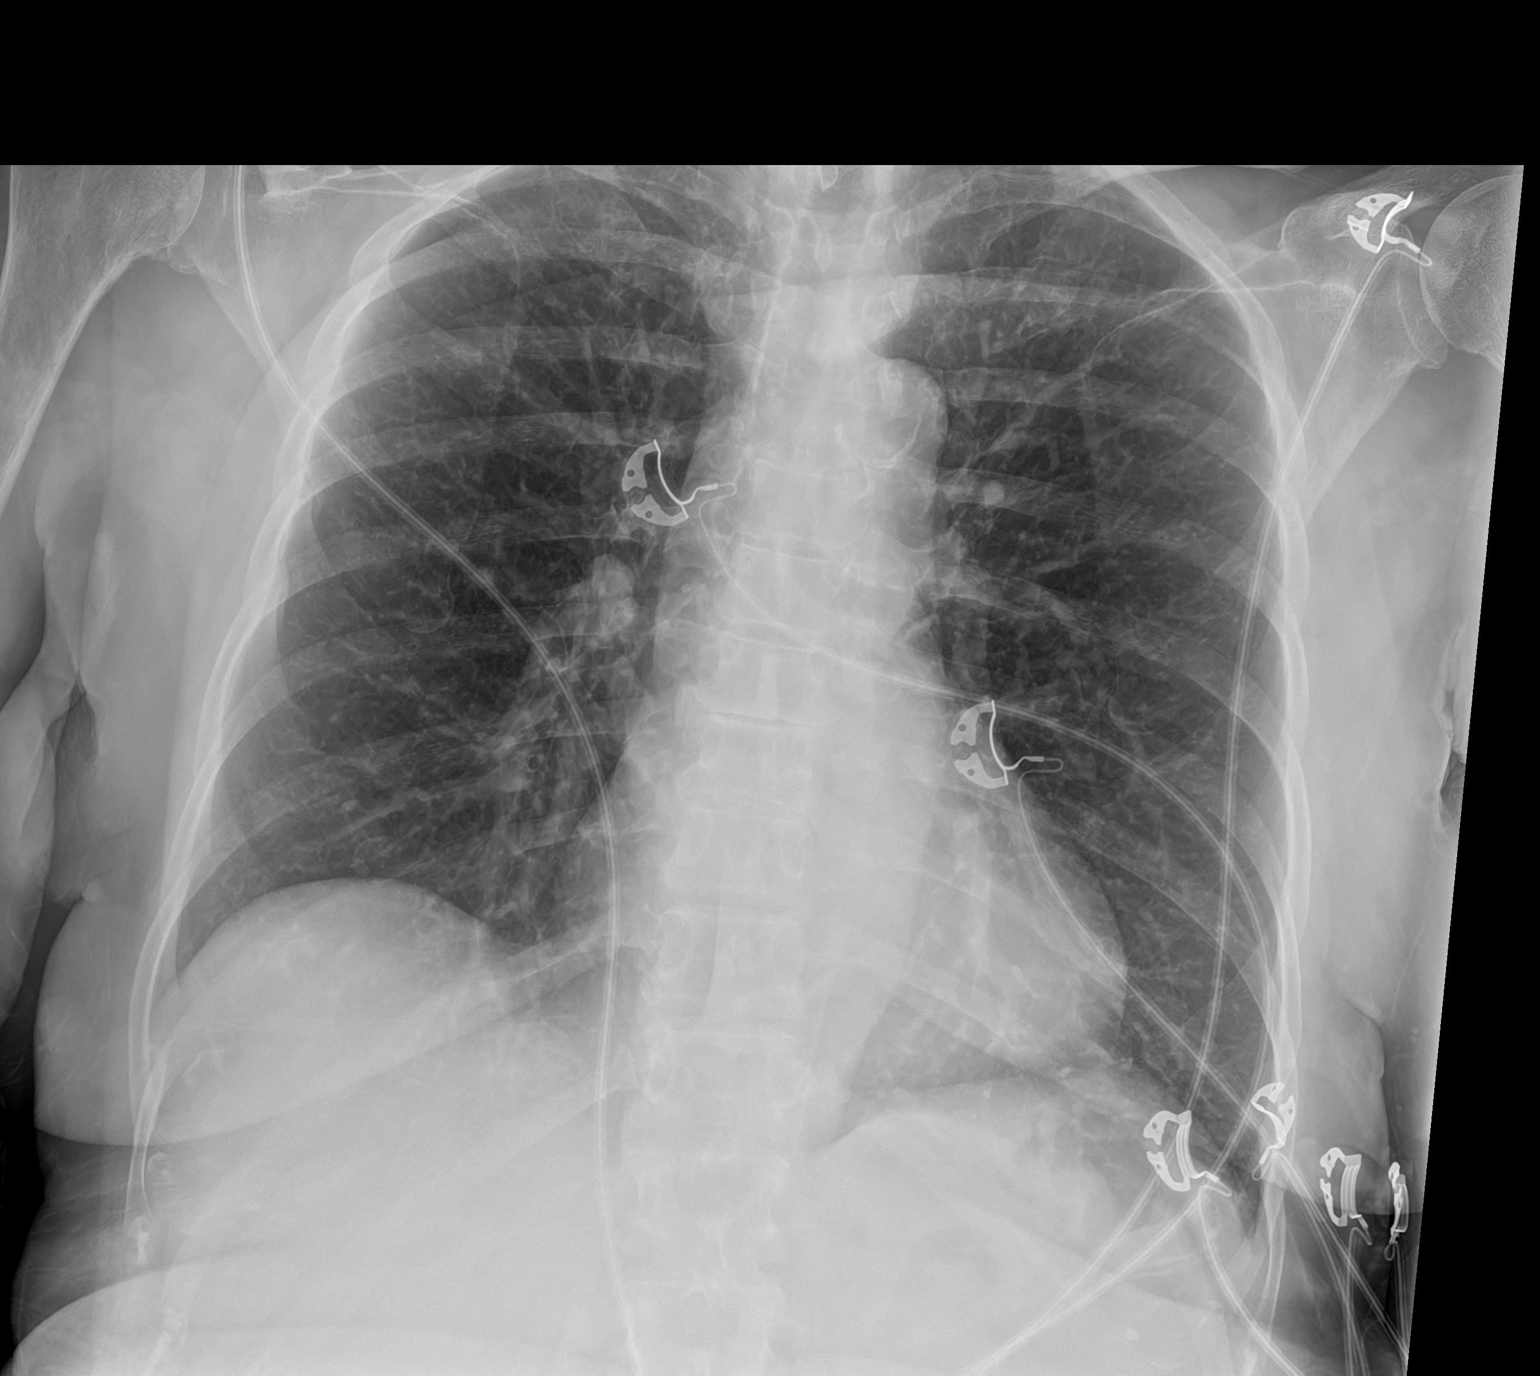

[1 of 1 positions shown; findings below may reference images not displayed]

FINDINGS: The heart size and mediastinal contours are within normal limits.
Atherosclerotic calcification of the aortic knob. No focal airspace
consolidation, pleural effusion, or pneumothorax. The visualized
skeletal structures are unremarkable.
IMPRESSION: No active disease.

## 2023-03-21 ENCOUNTER — Other Ambulatory Visit: Payer: Self-pay

## 2023-03-21 ENCOUNTER — Encounter (HOSPITAL_COMMUNITY): Payer: Self-pay

## 2023-03-21 ENCOUNTER — Emergency Department (HOSPITAL_COMMUNITY): Payer: Medicare Other

## 2023-03-21 ENCOUNTER — Emergency Department (HOSPITAL_COMMUNITY)
Admission: EM | Admit: 2023-03-21 | Discharge: 2023-03-22 | Disposition: A | Discharge status: Home / Self Care | Payer: Medicare Other | Attending: Emergency Medicine | Admitting: Emergency Medicine

## 2023-03-21 DIAGNOSIS — R0602 Shortness of breath: Secondary | ICD-10-CM | POA: Insufficient documentation

## 2023-03-21 DIAGNOSIS — R109 Unspecified abdominal pain: Secondary | ICD-10-CM | POA: Insufficient documentation

## 2023-03-21 DIAGNOSIS — K449 Diaphragmatic hernia without obstruction or gangrene: Secondary | ICD-10-CM | POA: Diagnosis not present

## 2023-03-21 DIAGNOSIS — J449 Chronic obstructive pulmonary disease, unspecified: Secondary | ICD-10-CM | POA: Insufficient documentation

## 2023-03-21 DIAGNOSIS — Y904 Blood alcohol level of 80-99 mg/100 ml: Secondary | ICD-10-CM | POA: Insufficient documentation

## 2023-03-21 DIAGNOSIS — F17200 Nicotine dependence, unspecified, uncomplicated: Secondary | ICD-10-CM | POA: Insufficient documentation

## 2023-03-21 DIAGNOSIS — R160 Hepatomegaly, not elsewhere classified: Secondary | ICD-10-CM | POA: Diagnosis not present

## 2023-03-21 DIAGNOSIS — M549 Dorsalgia, unspecified: Secondary | ICD-10-CM | POA: Diagnosis not present

## 2023-03-21 DIAGNOSIS — G8929 Other chronic pain: Secondary | ICD-10-CM | POA: Insufficient documentation

## 2023-03-21 DIAGNOSIS — K5730 Diverticulosis of large intestine without perforation or abscess without bleeding: Secondary | ICD-10-CM | POA: Diagnosis not present

## 2023-03-21 DIAGNOSIS — Z7951 Long term (current) use of inhaled steroids: Secondary | ICD-10-CM | POA: Diagnosis not present

## 2023-03-21 DIAGNOSIS — M545 Low back pain, unspecified: Secondary | ICD-10-CM | POA: Diagnosis not present

## 2023-03-21 DIAGNOSIS — I700 Atherosclerosis of aorta: Secondary | ICD-10-CM | POA: Diagnosis not present

## 2023-03-21 DIAGNOSIS — F10220 Alcohol dependence with intoxication, uncomplicated: Secondary | ICD-10-CM | POA: Diagnosis not present

## 2023-03-21 DIAGNOSIS — I517 Cardiomegaly: Secondary | ICD-10-CM | POA: Diagnosis not present

## 2023-03-21 MED ORDER — ALBUTEROL SULFATE (2.5 MG/3ML) 0.083% IN NEBU: 5.0000 mg | INHALATION_SOLUTION | RESPIRATORY_TRACT | Status: AC

## 2023-03-21 MED ORDER — TRAMADOL HCL 50 MG PO TABS
50.0000 mg | ORAL_TABLET | Freq: Once | ORAL | Status: AC
  Administered 2023-03-21: 50 mg via ORAL
  Filled 2023-03-21: qty 1

## 2023-03-21 NOTE — ED Notes (Signed)
ED Provider at bedside. 

## 2023-03-21 NOTE — ED Notes (Signed)
Pt wheeled to restroom. Pt missed hat while getting urine sample

## 2023-03-21 NOTE — ED Provider Notes (Signed)
[place] EMERGENCY DEPARTMENT AT [facility] Provider Note   CSN: [id number] Arrival date & time: 03/21/23  9562     History  Chief Complaint  Patient presents with   Back Pain    Stacy Dalton is a 72 y.o. female.  HPI    72 year old female with history of alcohol dependence, COPD, previous history of tobacco use disorder, opioid use disorder and liver cirrhosis comes in with chief complaint of back pain.  Patient currently intoxicated.  She indicates that she has chronic back pain, and that she drinks because of her pain.  She used to take tramadol, but she lost her PCP because of missed appointments and started drinking.  Now she finds herself in a difficult position where she has to drink regularly reduce feel better.  She has quit drinking in the past but relapsed.  She would like to quit drinking again at some point.  I called patient's home phone and spoke with her friend who lives with her.  He indicates that patient has been complaining of abdominal pain.  I also called patient's son and he indicated that patient was complaining of abdominal pain and shortness of breath to her 2 days ago when he saw her.  Patient has history of abdominal surgeries and obstruction. Home Medications Prior to Admission medications   Medication Sig Start Date End Date Taking? Authorizing Provider  albuterol (VENTOLIN HFA) 108 (90 Base) MCG/ACT inhaler Inhale 2 puffs into the lungs every 4 (four) hours as needed for wheezing or shortness of breath. 03/28/22   Shon Hale, MD  amLODipine (NORVASC) 10 MG tablet Take 1 tablet (10 mg total) by mouth daily. 03/28/22   Shon Hale, MD  cyanocobalamin (VITAMIN B12) 1000 MCG tablet Take 1 tablet (1,000 mcg total) by mouth daily. Patient not taking: Reported on 04/06/2022 03/28/22   Shon Hale, MD  Fluticasone-Umeclidin-Vilant (TRELEGY ELLIPTA) 200-62.5-25 MCG/ACT AEPB Inhale 1 puff into the lungs daily. Patient not taking:  Reported on 04/06/2022 03/28/22   Shon Hale, MD  hydrOXYzine (VISTARIL) 25 MG capsule Take 1 capsule (25 mg total) by mouth every 4 (four) hours as needed for anxiety or nausea. 03/28/22   Shon Hale, MD  levothyroxine (SYNTHROID) 50 MCG tablet Take 1 tablet (50 mcg total) by mouth daily before breakfast. 03/28/22   Mariea Clonts, Courage, MD  losartan (COZAAR) 50 MG tablet Take 1 tablet (50 mg total) by mouth daily. Patient not taking: Reported on 04/06/2022 03/28/22   Shon Hale, MD  metoprolol tartrate (LOPRESSOR) 25 MG tablet Take 1 tablet (25 mg total) by mouth 2 (two) times daily. Patient not taking: Reported on 04/06/2022 03/28/22   Shon Hale, MD  pantoprazole (PROTONIX) 40 MG tablet Take 1 tablet (40 mg total) by mouth 2 (two) times daily. Patient not taking: Reported on 04/06/2022 03/28/22   Shon Hale, MD  traMADol (ULTRAM) 50 MG tablet Take 1 tablet (50 mg total) by mouth every 6 (six) hours as needed for severe pain. 04/11/22   Catarina Hartshorn, MD      Allergies    Librium [chlordiazepoxide], Toradol [ketorolac tromethamine], Butalbital-apap-caffeine, Demerol, Esgic [butalbital-apap-caffeine], Iodine, Limonene, Meperidine, and Pyridium [phenazopyridine]    Review of Systems   Review of Systems  All other systems reviewed and are negative.   Physical Exam Updated Vital Signs BP (!) 115/50 (BP Location: Left Arm)   Pulse 89   Temp (!) 97.5 F (36.4 C) (Oral)   Resp 20   Ht 5\' 1"  (1.549 m)  Wt 59 kg   SpO2 90%   BMI 24.58 kg/m  Physical Exam Vitals and nursing note reviewed.  Constitutional:      Appearance: She is well-developed.  HENT:     Head: Atraumatic.  Eyes:     Extraocular Movements: Extraocular movements intact.     Pupils: Pupils are equal, round, and reactive to light.  Cardiovascular:     Rate and Rhythm: Normal rate.  Pulmonary:     Effort: Pulmonary effort is normal.  Abdominal:     Tenderness: There is abdominal tenderness. There is  no guarding or rebound.  Musculoskeletal:     Cervical back: Normal range of motion and neck supple.  Skin:    General: Skin is warm and dry.  Neurological:     Mental Status: She is alert and oriented to person, place, and time.     ED Results / Procedures / Treatments   Labs (all labs ordered are listed, but only abnormal results are displayed) Labs Reviewed  COMPREHENSIVE METABOLIC PANEL - Abnormal; Notable for the following components:      Result Value   Sodium 134 (*)    Potassium 3.4 (*)    CO2 21 (*)    Glucose, Bld 140 (*)    BUN 5 (*)    Albumin 3.1 (*)    AST 120 (*)    ALT 68 (*)    Alkaline Phosphatase 131 (*)    Total Bilirubin 2.0 (*)    All other components within normal limits  CBC WITH DIFFERENTIAL/PLATELET - Abnormal; Notable for the following components:   WBC 13.2 (*)    Hemoglobin 11.9 (*)    RDW 24.2 (*)    All other components within normal limits  ETHANOL - Abnormal; Notable for the following components:   Alcohol, Ethyl (B) 91 (*)    All other components within normal limits  AMMONIA    EKG None  Radiology No results found.  Procedures Procedures    Medications Ordered in ED Medications  albuterol (PROVENTIL) (2.5 MG/3ML) 0.083% nebulizer solution 5 mg (5 mg Nebulization Not Given 03/22/23 0005)  traMADol (ULTRAM) tablet 50 mg (50 mg Oral Given 03/21/23 2317)    ED Course/ Medical Decision Making/ A&P                                 Medical Decision Making Amount and/or Complexity of Data Reviewed Labs: ordered. Radiology: ordered. ECG/medicine tests: ordered.  Risk Prescription drug management.   72 year old female with history of COPD, alcohol use disorder, cirrhosis and abdominal surgeries comes in with chief complaint of back pain.  Upon calling family, it appears that patient also has abdominal pain.  Collateral history provided by patient's son and also her friend with whom she lives.  I also reviewed patient's previous  discharge summaries.  Based on the exam, which overall is reassuring besides patient being intoxicated and having back pain and abdominal discomfort without peritonitis, and discussion with patient's family -differential diagnosis includes colitis, diverticulitis, gastritis, perforated viscus, degenerative spine disease, cystitis.  Plan is to get basic labs and CT abdomen pelvis with contrast along with some spine reformats.  Of note, patient denied any SI or HI to me.  If the workup is negative, patient can be discharged with outpatient resources.  Final Clinical Impression(s) / ED Diagnoses Final diagnoses:  Alcohol dependence with uncomplicated intoxication (HCC)  Chronic midline low back pain  without sciatica    Rx / DC Orders ED Discharge Orders     None         Derwood Kaplan, MD 03/24/23 [phone]

## 2023-03-21 NOTE — ED Notes (Signed)
Gave pt ice water

## 2023-03-21 NOTE — Discharge Instructions (Signed)
The following is the address of [facility] behavioral health urgent care -this facility should have resources for alcohol cessation. [address] [place], [place] [address]  Phone: [phone] Fax: [phone]  Substance Abuse Treatment Programs  Intensive Outpatient Programs [facility]     [address]      [place], [place]                   [phone]       [facility] [address] [place], [place] [phone]  [facility]     (Inpatient and outpatient)     [address]           [phone]    [facility] [phone] (Suboxone and Methadone)  [address]      [place], [place] [address]      [phone]       [address] [place], [place] [phone]  [facility] (Outpatient/Inpatient, [employer])    (insurance only) [phone]             [facility] (Groups & Residential) [place], [place] [phone]     [facility]     [address]     [place], [place]      [phone]       [facility] (for caregivers and family) [phone] [address] [place], [place] [phone]      Residential Treatment Programs [facility]      [address] [place], [place] [address]  [phone]       [facility] [address] [place], [place] [address] [phone]  [facility] [place]        [phone]       [facility] [phone]  [facility] ([facility]             [address]                                         [place], [place]                                                [phone] or [phone]                               [facility] [place] [address] [place], [address] [phone]  [facility]    [address]      [place], [place]     [phone]       [facility] [address] [place],  [place] [phone]  [facility]   [address] [place], [place] [address]     [phone]      Admissions: 8am-3pm M-F  [facility]idential Treatment Services (RTS) [address] [place], [place] [phone]  [facility]: Residential Program [phone] Days)   [place], [place]      [phone] or [phone]     [facility]: [facility] [place], [place] (Walk in Hours over the weekend or by referral)  [facility] [address] [place], [place], [place] [address] [phone]  Crisis Mobile: [facility]:  [phone] (for crisis response 24 hours a day) [facility] Hotline:      [phone] Outpatient Psychiatry and Counseling  [facility]: Mobile Crisis Management 24 hours:  [phone]  [facility] of the [employer] sliding scale fee and walk in schedule: M-F 8am-12pm/1pm-3pm [address]  [place], [place] [address] [phone]  [facility] [address] [place], [place] [address] [phone]  [facility] ([facility] [employer])- new patient walk-in appointments available Monday - Friday 8am -3pm.          [address] [place], [place] [address] [phone] or crisis line- [phone]  [facility] Services/ Intensive Outpatient Therapy Program [address] [place], [place] [address] [phone]  [facility] Mental Health                  Crisis Services      [phone] [address]     [place], [place] [address]                 [facility]   [facility] [phone]. [address] [place], [place] [address]   [facility]          [address]  [place], [place] [address]       [phone]  [facility] [address] [place], [place] [address] [phone]  [facility]    [address]    [place], [place] [address]     [phone]       Andee Poles, MD     [address]     [place] [address]     [phone]       [facility] [address] [place] [address]  [facility]     [address] [place], [place]      [phone]       [facility] Eulogio Ditch, MD [address] [place], [place] [address] [phone]  [facility]     [address]     [place], [place] [address]     [phone]       [facility] [address] [place], [place] [address] [phone] Resources for Temporary Residential Assistance/Crisis Centers  [facility]ENT[facility]) M-F 8am-3pm   [address] [place], [place] [address]   [phone] Services include: laundry, barbering, support groups, case management, phone  & computer access, showers, AA/NA mtgs, mental health/substance abuse nurse, job skills class, disability information, VA assistance, spiritual classes, etc.   HOMELESS SHELTERS  [facility]     [facility]   [address] [place]     [phone]              [employer] (women and children)       [address] [place], [place] [address] [phone] [email or web address] for application and process Application Required  [facility]   [address]    [place] [address]     [phone]                    [facility] [place] [address] [place], [place] [address] [phone]  [phone](WGNFAOZH application appt.) Application Required  [facility] (women only)    [address]     [place], [place] [address]     [phone]      Intake starts 6pm daily Need valid ID, SSC, & Police report [employer] [address] [place], [place] [phone] Application  Required  [employer] (men only)     [address]      [place], [place]     [phone]       [facility] of the [place] (Pregnant women only) [address] [place], [place] [phone]  [facility]      [address]      [place], [place] [address]     [phone]             [facility] [address] [place], [place] [phone] 90 day commitment/SA/Application process  [facility](men only)     [address]     [place], [place]     [phone]       Check-in at [facility] [address] [place], [place] [address] [phone] Men/Women/Women and Children must be there by 7 pm  [facility] [place], [place] [phone]

## 2023-03-21 NOTE — Progress Notes (Signed)
Saw order for Albuterol, patient is currently in bed in hallway.  Explained to patient's RN that I was not comfortable giving breathing treatment in hallway.  Patient is currently not on monitor but BS are clear and diminished.  Asked RN to call when patient gets into room and will give treatment at that time.  Patient does not appear in any respiratory distress at this time and per RN, complaining of back pain.

## 2023-03-21 NOTE — ED Triage Notes (Addendum)
Rcems from home. Cc of chronic back pain. ETOH. Hx of SI; feels the same. Alert to person, place, situation.

## 2023-03-22 ENCOUNTER — Emergency Department (HOSPITAL_COMMUNITY): Payer: Medicare Other

## 2023-03-22 DIAGNOSIS — R0602 Shortness of breath: Secondary | ICD-10-CM | POA: Diagnosis not present

## 2023-03-22 DIAGNOSIS — I700 Atherosclerosis of aorta: Secondary | ICD-10-CM | POA: Diagnosis not present

## 2023-03-22 DIAGNOSIS — M549 Dorsalgia, unspecified: Secondary | ICD-10-CM | POA: Diagnosis not present

## 2023-03-22 DIAGNOSIS — G8929 Other chronic pain: Secondary | ICD-10-CM | POA: Diagnosis not present

## 2023-03-22 DIAGNOSIS — M545 Low back pain, unspecified: Secondary | ICD-10-CM | POA: Diagnosis not present

## 2023-03-22 DIAGNOSIS — I517 Cardiomegaly: Secondary | ICD-10-CM | POA: Diagnosis not present

## 2023-03-22 DIAGNOSIS — K5730 Diverticulosis of large intestine without perforation or abscess without bleeding: Secondary | ICD-10-CM | POA: Diagnosis not present

## 2023-03-22 DIAGNOSIS — R160 Hepatomegaly, not elsewhere classified: Secondary | ICD-10-CM | POA: Diagnosis not present

## 2023-03-22 DIAGNOSIS — K449 Diaphragmatic hernia without obstruction or gangrene: Secondary | ICD-10-CM | POA: Diagnosis not present

## 2023-03-22 LAB — CBC WITH DIFFERENTIAL/PLATELET
Abs Immature Granulocytes: 0.33 10*3/uL — ABNORMAL HIGH (ref 0.00–0.07)
Basophils Absolute: 0.1 10*3/uL (ref 0.0–0.1)
Basophils Relative: 1 %
Eosinophils Absolute: 0.4 10*3/uL (ref 0.0–0.5)
Eosinophils Relative: 3 %
HCT: 37.2 % (ref 36.0–46.0)
Hemoglobin: 11.9 g/dL — ABNORMAL LOW (ref 12.0–15.0)
Immature Granulocytes: 3 %
Lymphocytes Relative: 11 %
Lymphs Abs: 1.5 10*3/uL (ref 0.7–4.0)
MCH: 28.9 pg (ref 26.0–34.0)
MCHC: 32 g/dL (ref 30.0–36.0)
MCV: 90.3 fL (ref 80.0–100.0)
Monocytes Absolute: 1 10*3/uL (ref 0.1–1.0)
Monocytes Relative: 8 %
Neutro Abs: 9.8 10*3/uL — ABNORMAL HIGH (ref 1.7–7.7)
Neutrophils Relative %: 74 %
Platelets: 380 10*3/uL (ref 150–400)
RBC: 4.12 MIL/uL (ref 3.87–5.11)
RDW: 24.2 % — ABNORMAL HIGH (ref 11.5–15.5)
WBC: 13.2 10*3/uL — ABNORMAL HIGH (ref 4.0–10.5)
nRBC: 0 % (ref 0.0–0.2)

## 2023-03-22 LAB — COMPREHENSIVE METABOLIC PANEL
ALT: 68 U/L — ABNORMAL HIGH (ref 0–44)
AST: 120 U/L — ABNORMAL HIGH (ref 15–41)
Albumin: 3.1 g/dL — ABNORMAL LOW (ref 3.5–5.0)
Alkaline Phosphatase: 131 U/L — ABNORMAL HIGH (ref 38–126)
Anion gap: 15 (ref 5–15)
BUN: 5 mg/dL — ABNORMAL LOW (ref 8–23)
CO2: 21 mmol/L — ABNORMAL LOW (ref 22–32)
Calcium: 9.5 mg/dL (ref 8.9–10.3)
Chloride: 98 mmol/L (ref 98–111)
Creatinine, Ser: 0.58 mg/dL (ref 0.44–1.00)
GFR, Estimated: >60 mL/min (ref >60)
Glucose, Bld: 140 mg/dL — ABNORMAL HIGH (ref 70–99)
Potassium: 3.4 mmol/L — ABNORMAL LOW (ref 3.5–5.1)
Sodium: 134 mmol/L — ABNORMAL LOW (ref 135–145)
Total Bilirubin: 2 mg/dL — ABNORMAL HIGH (ref 0.3–1.2)
Total Protein: 6.8 g/dL (ref 6.5–8.1)

## 2023-03-22 LAB — AMMONIA: Ammonia: 22 umol/L (ref 9–35)

## 2023-03-22 LAB — ETHANOL: Alcohol, Ethyl (B): 91 mg/dL — ABNORMAL HIGH (ref <10)

## 2023-03-22 MED ORDER — IOHEXOL 300 MG/ML  SOLN
100.0000 mL | Freq: Once | INTRAMUSCULAR | Status: AC | PRN
  Administered 2023-03-22: 100 mL via INTRAVENOUS

## 2023-03-22 MED ORDER — ALBUTEROL SULFATE (2.5 MG/3ML) 0.083% IN NEBU: 2.5000 mg | INHALATION_SOLUTION | RESPIRATORY_TRACT | Status: DC | PRN

## 2023-03-22 NOTE — ED Notes (Signed)
Oral hydration provided. Crackers at bedside. Pt currently declines eating them.

## 2023-03-22 NOTE — ED Notes (Signed)
Discharge instructions reviewed.   Opportunity for questions or concerns provided.   Offered pain analgesia prior to leaving but declined saying "that will not help".   Departed in wheelchair. Notification to significant other made to pick patient up.

## 2023-03-22 NOTE — ED Provider Notes (Signed)
Workup in the ER has been unremarkable.  Patient eating and drinking.  Patient is ambulatory. She is safe for discharge home   Zadie Rhine, MD 03/22/23 [phone]

## 2023-03-22 NOTE — ED Notes (Signed)
Ambulatory to rest room with minimal assistance

## 2023-04-25 ENCOUNTER — Telehealth: Payer: Self-pay

## 2023-04-25 NOTE — Telephone Encounter (Signed)
Transition Care Management Unsuccessful Follow-up Telephone Call  Date of discharge and from where:  Stacy Dalton 9/14  Attempts:  1st Attempt  Reason for unsuccessful TCM follow-up call:  No answer/busy   Lenard Forth [place]  [facility] Guide, Phone: [phone] Website: [email or web address]

## 2023-04-26 ENCOUNTER — Telehealth: Payer: Self-pay

## 2023-04-26 NOTE — Telephone Encounter (Signed)
Transition Care Management Unsuccessful Follow-up Telephone Call  Date of discharge and from where:  Stacy Dalton 9/19  Attempts:  2nd Attempt  Reason for unsuccessful TCM follow-up call:  No answer/busy   Stacy Dalton  [facility] Guide, Phone: [phone] Website: [email or web address]

## 2023-05-10 ENCOUNTER — Other Ambulatory Visit: Payer: Self-pay

## 2023-05-10 ENCOUNTER — Emergency Department (HOSPITAL_COMMUNITY): Payer: Medicare Other

## 2023-05-10 ENCOUNTER — Inpatient Hospital Stay (HOSPITAL_COMMUNITY)
Admission: EM | Admit: 2023-05-10 | Discharge: 2023-05-14 | DRG: 896 | Disposition: A | Discharge status: Skilled Nursing Facility | Payer: Medicare Other | Attending: Family Medicine | Admitting: Family Medicine

## 2023-05-10 ENCOUNTER — Encounter (HOSPITAL_COMMUNITY): Payer: Self-pay

## 2023-05-10 DIAGNOSIS — Z87891 Personal history of nicotine dependence: Secondary | ICD-10-CM

## 2023-05-10 DIAGNOSIS — F1010 Alcohol abuse, uncomplicated: Secondary | ICD-10-CM | POA: Diagnosis present

## 2023-05-10 DIAGNOSIS — G8929 Other chronic pain: Secondary | ICD-10-CM | POA: Diagnosis present

## 2023-05-10 DIAGNOSIS — Z7141 Alcohol abuse counseling and surveillance of alcoholic: Secondary | ICD-10-CM

## 2023-05-10 DIAGNOSIS — R4182 Altered mental status, unspecified: Secondary | ICD-10-CM | POA: Diagnosis present

## 2023-05-10 DIAGNOSIS — F10120 Alcohol abuse with intoxication, uncomplicated: Principal | ICD-10-CM | POA: Diagnosis present

## 2023-05-10 DIAGNOSIS — Z79899 Other long term (current) drug therapy: Secondary | ICD-10-CM

## 2023-05-10 DIAGNOSIS — E876 Hypokalemia: Secondary | ICD-10-CM | POA: Diagnosis present

## 2023-05-10 DIAGNOSIS — Z79890 Hormone replacement therapy (postmenopausal): Secondary | ICD-10-CM

## 2023-05-10 DIAGNOSIS — G9341 Metabolic encephalopathy: Secondary | ICD-10-CM | POA: Diagnosis present

## 2023-05-10 DIAGNOSIS — F10920 Alcohol use, unspecified with intoxication, uncomplicated: Principal | ICD-10-CM

## 2023-05-10 DIAGNOSIS — E039 Hypothyroidism, unspecified: Secondary | ICD-10-CM | POA: Diagnosis present

## 2023-05-10 DIAGNOSIS — J441 Chronic obstructive pulmonary disease with (acute) exacerbation: Secondary | ICD-10-CM | POA: Diagnosis present

## 2023-05-10 DIAGNOSIS — Z8249 Family history of ischemic heart disease and other diseases of the circulatory system: Secondary | ICD-10-CM

## 2023-05-10 DIAGNOSIS — Z888 Allergy status to other drugs, medicaments and biological substances status: Secondary | ICD-10-CM

## 2023-05-10 DIAGNOSIS — I700 Atherosclerosis of aorta: Secondary | ICD-10-CM | POA: Diagnosis not present

## 2023-05-10 DIAGNOSIS — M545 Low back pain, unspecified: Secondary | ICD-10-CM | POA: Diagnosis present

## 2023-05-10 DIAGNOSIS — Z825 Family history of asthma and other chronic lower respiratory diseases: Secondary | ICD-10-CM

## 2023-05-10 DIAGNOSIS — K219 Gastro-esophageal reflux disease without esophagitis: Secondary | ICD-10-CM | POA: Diagnosis present

## 2023-05-10 DIAGNOSIS — Y907 Blood alcohol level of 200-239 mg/100 ml: Secondary | ICD-10-CM | POA: Diagnosis present

## 2023-05-10 DIAGNOSIS — R531 Weakness: Secondary | ICD-10-CM

## 2023-05-10 DIAGNOSIS — Z90710 Acquired absence of both cervix and uterus: Secondary | ICD-10-CM

## 2023-05-10 DIAGNOSIS — J9811 Atelectasis: Secondary | ICD-10-CM | POA: Diagnosis not present

## 2023-05-10 DIAGNOSIS — E519 Thiamine deficiency, unspecified: Secondary | ICD-10-CM | POA: Diagnosis present

## 2023-05-10 DIAGNOSIS — Z9049 Acquired absence of other specified parts of digestive tract: Secondary | ICD-10-CM

## 2023-05-10 DIAGNOSIS — Z8349 Family history of other endocrine, nutritional and metabolic diseases: Secondary | ICD-10-CM

## 2023-05-10 DIAGNOSIS — Z885 Allergy status to narcotic agent status: Secondary | ICD-10-CM

## 2023-05-10 DIAGNOSIS — R079 Chest pain, unspecified: Secondary | ICD-10-CM | POA: Diagnosis not present

## 2023-05-10 DIAGNOSIS — I10 Essential (primary) hypertension: Secondary | ICD-10-CM | POA: Diagnosis present

## 2023-05-10 LAB — BASIC METABOLIC PANEL
Anion gap: 17 — ABNORMAL HIGH (ref 5–15)
BUN: 5 mg/dL — ABNORMAL LOW (ref 8–23)
CO2: 21 mmol/L — ABNORMAL LOW (ref 22–32)
Calcium: 7.8 mg/dL — ABNORMAL LOW (ref 8.9–10.3)
Chloride: 99 mmol/L (ref 98–111)
Creatinine, Ser: 0.58 mg/dL (ref 0.44–1.00)
GFR, Estimated: >60 mL/min (ref >60)
Glucose, Bld: 126 mg/dL — ABNORMAL HIGH (ref 70–99)
Potassium: 3 mmol/L — ABNORMAL LOW (ref 3.5–5.1)
Sodium: 137 mmol/L (ref 135–145)

## 2023-05-10 LAB — CBC WITH DIFFERENTIAL/PLATELET
Abs Immature Granulocytes: 0.12 10*3/uL — ABNORMAL HIGH (ref 0.00–0.07)
Basophils Absolute: 0 10*3/uL (ref 0.0–0.1)
Basophils Relative: 0 %
Eosinophils Absolute: 0.5 10*3/uL (ref 0.0–0.5)
Eosinophils Relative: 5 %
HCT: 38.6 % (ref 36.0–46.0)
Hemoglobin: 12 g/dL (ref 12.0–15.0)
Immature Granulocytes: 1 %
Lymphocytes Relative: 16 %
Lymphs Abs: 1.7 10*3/uL (ref 0.7–4.0)
MCH: 27.6 pg (ref 26.0–34.0)
MCHC: 31.1 g/dL (ref 30.0–36.0)
MCV: 88.9 fL (ref 80.0–100.0)
Monocytes Absolute: 0.9 10*3/uL (ref 0.1–1.0)
Monocytes Relative: 8 %
Neutro Abs: 7.5 10*3/uL (ref 1.7–7.7)
Neutrophils Relative %: 70 %
Platelets: 352 10*3/uL (ref 150–400)
RBC: 4.34 MIL/uL (ref 3.87–5.11)
RDW: 15.7 % — ABNORMAL HIGH (ref 11.5–15.5)
WBC: 10.7 10*3/uL — ABNORMAL HIGH (ref 4.0–10.5)
nRBC: 0 % (ref 0.0–0.2)

## 2023-05-10 LAB — COMPREHENSIVE METABOLIC PANEL
ALT: 35 U/L (ref 0–44)
AST: 76 U/L — ABNORMAL HIGH (ref 15–41)
Albumin: 2.8 g/dL — ABNORMAL LOW (ref 3.5–5.0)
Alkaline Phosphatase: 113 U/L (ref 38–126)
Anion gap: 17 — ABNORMAL HIGH (ref 5–15)
BUN: 6 mg/dL — ABNORMAL LOW (ref 8–23)
CO2: 21 mmol/L — ABNORMAL LOW (ref 22–32)
Calcium: 7.8 mg/dL — ABNORMAL LOW (ref 8.9–10.3)
Chloride: 96 mmol/L — ABNORMAL LOW (ref 98–111)
Creatinine, Ser: 0.64 mg/dL (ref 0.44–1.00)
GFR, Estimated: >60 mL/min (ref >60)
Glucose, Bld: 104 mg/dL — ABNORMAL HIGH (ref 70–99)
Potassium: 2.4 mmol/L — CL (ref 3.5–5.1)
Sodium: 134 mmol/L — ABNORMAL LOW (ref 135–145)
Total Bilirubin: 1.2 mg/dL — ABNORMAL HIGH (ref <1.2)
Total Protein: 6.1 g/dL — ABNORMAL LOW (ref 6.5–8.1)

## 2023-05-10 LAB — ETHANOL: Alcohol, Ethyl (B): 219 mg/dL — ABNORMAL HIGH (ref <10)

## 2023-05-10 LAB — LIPASE, BLOOD: Lipase: 21 U/L (ref 11–51)

## 2023-05-10 LAB — TROPONIN I (HIGH SENSITIVITY)
Troponin I (High Sensitivity): 13 ng/L (ref <18)
Troponin I (High Sensitivity): 13 ng/L (ref <18)

## 2023-05-10 LAB — MAGNESIUM: Magnesium: 1.4 mg/dL — ABNORMAL LOW (ref 1.7–2.4)

## 2023-05-10 MED ORDER — LORAZEPAM 1 MG PO TABS
1.0000 mg | ORAL_TABLET | ORAL | Status: AC | PRN
  Administered 2023-05-11 – 2023-05-12 (×3): 1 mg via ORAL
  Filled 2023-05-10 (×3): qty 1
  Filled 2023-05-10: qty 2

## 2023-05-10 MED ORDER — MAGNESIUM SULFATE 2 GM/50ML IV SOLN
2.0000 g | Freq: Once | INTRAVENOUS | Status: AC
  Administered 2023-05-10: 2 g via INTRAVENOUS
  Filled 2023-05-10: qty 50

## 2023-05-10 MED ORDER — ADULT MULTIVITAMIN W/MINERALS CH
1.0000 | ORAL_TABLET | Freq: Every day | ORAL | Status: DC
  Administered 2023-05-11 – 2023-05-14 (×4): 1 via ORAL
  Filled 2023-05-10 (×4): qty 1

## 2023-05-10 MED ORDER — LORAZEPAM 2 MG/ML IJ SOLN
1.0000 mg | INTRAMUSCULAR | Status: AC | PRN
  Administered 2023-05-12 – 2023-05-13 (×4): 2 mg via INTRAVENOUS
  Administered 2023-05-13: 1 mg via INTRAVENOUS
  Filled 2023-05-10: qty 2
  Filled 2023-05-10 (×5): qty 1

## 2023-05-10 MED ORDER — POTASSIUM CHLORIDE CRYS ER 20 MEQ PO TBCR
40.0000 meq | EXTENDED_RELEASE_TABLET | Freq: Once | ORAL | Status: AC
  Administered 2023-05-10: 40 meq via ORAL
  Filled 2023-05-10: qty 2

## 2023-05-10 MED ORDER — THIAMINE HCL 100 MG/ML IJ SOLN
100.0000 mg | Freq: Every day | INTRAMUSCULAR | Status: DC
Start: 2023-05-11 — End: 2023-05-14

## 2023-05-10 MED ORDER — THIAMINE MONONITRATE 100 MG PO TABS
100.0000 mg | ORAL_TABLET | Freq: Every day | ORAL | Status: DC
  Administered 2023-05-11 – 2023-05-14 (×4): 100 mg via ORAL
  Filled 2023-05-10 (×4): qty 1

## 2023-05-10 MED ORDER — POTASSIUM CHLORIDE 10 MEQ/100ML IV SOLN
10.0000 meq | INTRAVENOUS | Status: AC
  Administered 2023-05-10 (×3): 10 meq via INTRAVENOUS
  Filled 2023-05-10 (×3): qty 100

## 2023-05-10 MED ORDER — TRAMADOL HCL 50 MG PO TABS
50.0000 mg | ORAL_TABLET | Freq: Once | ORAL | Status: AC
  Administered 2023-05-10: 50 mg via ORAL
  Filled 2023-05-10: qty 1

## 2023-05-10 MED ORDER — FOLIC ACID 1 MG PO TABS
1.0000 mg | ORAL_TABLET | Freq: Every day | ORAL | Status: DC
  Administered 2023-05-11 – 2023-05-14 (×4): 1 mg via ORAL
  Filled 2023-05-10 (×4): qty 1

## 2023-05-10 MED ORDER — LACTATED RINGERS IV BOLUS
1000.0000 mL | Freq: Once | INTRAVENOUS | Status: AC
  Administered 2023-05-10: 1000 mL via INTRAVENOUS

## 2023-05-10 MED ORDER — POTASSIUM CHLORIDE 10 MEQ/100ML IV SOLN
10.0000 meq | INTRAVENOUS | Status: AC
Start: 2023-05-11 — End: 2023-05-11
  Administered 2023-05-11 (×3): 10 meq via INTRAVENOUS
  Filled 2023-05-10: qty 100

## 2023-05-10 NOTE — ED Notes (Signed)
Crackers provided per EDP.

## 2023-05-10 NOTE — ED Notes (Signed)
ED TO INPATIENT HANDOFF REPORT  ED Nurse Name and Phone #: Nelly Laurence Name/Age/Gender Stacy Dalton 72 y.o. female Room/Bed: APA18/APA18  Code Status   Code Status: Full Code  Home/SNF/Other Home Patient oriented to: self, place, time, and situation Is this baseline? Yes   Triage Complete: Triage complete  Chief Complaint Altered mental status [R41.82]  Triage Note BIB EMS said called out for breathing problems but when they showed up, the pt was sitting on the porch yelling, therefore airway was clear. Pt. Keeps speaking of boyfriend and brother. Not diabetic.    Allergies Allergies  Allergen Reactions   Librium [Chlordiazepoxide]     Became unresponsive   Toradol [Ketorolac Tromethamine] Shortness Of Breath   Butalbital-Apap-Caffeine Other (See Comments)    jittery   Demerol Swelling   Esgic [Butalbital-Apap-Caffeine] Other (See Comments)    jittery   Iodine Other (See Comments)    bp bottomed out per pt several hours later; unsure if pre medicated in past with cm; [facility]   Limonene     Other reaction(s): Other (See Comments) jittery   Meperidine Swelling   Pyridium [Phenazopyridine] Nausea Only    Level of Care/Admitting Diagnosis ED Disposition     ED Disposition  Admit   Condition  --   Comment  Hospital Area: [facility] [[id number]]  Level of Care: Telemetry [5]  Covid Evaluation: Asymptomatic - no recent exposure (last 10 days) testing not required  Diagnosis: Altered mental status [780.97.ICD-9-CM]  Admitting Physician: Lilyan Gilford [[id number]]  Attending Physician: Lilyan Gilford [[id number]]          B Medical/Surgery History Past Medical History:  Diagnosis Date   Chronic pain    COPD (chronic obstructive pulmonary disease) (HCC)    told has copd, no current inhaler use   Cough    Depression    ETOH abuse    GERD (gastroesophageal reflux disease)    Hypertension    Hypothyroidism    Insomnia     Migraine    Migraine    Osteopenia    Pain management    Panic attacks    Small bowel obstruction [facility])    Past Surgical History:  Procedure Laterality Date   ABDOMINAL HYSTERECTOMY     BIOPSY  08/29/2021   Procedure: BIOPSY;  Surgeon: Lanelle Bal, DO;  Location: [facility];  Service: Endoscopy;;   COLON SURGERY     COLOSTOMY CLOSURE  04/19/2012   Procedure: COLOSTOMY CLOSURE;  Surgeon: Ernestene Mention, MD;  Location: [facility] ORS;  Service: General;  Laterality: N/A;  Laparotomy, Resection and Closure of Colostomy   COLOSTOMY TAKEDOWN N/A 04/12/2016   Procedure: Luz Brazen TAKEDOWN;  Surgeon: Luretha Murphy, MD;  Location: [facility];  Service: General;  Laterality: N/A;   ESOPHAGOGASTRODUODENOSCOPY (EGD) WITH PROPOFOL N/A 08/29/2021   Procedure: ESOPHAGOGASTRODUODENOSCOPY (EGD) WITH PROPOFOL;  Surgeon: Lanelle Bal, DO;  Location: [facility];  Service: Endoscopy;  Laterality: N/A;   INCONTINENCE SURGERY     LAPAROTOMY  10/04/2011, colostomy also   Procedure: EXPLORATORY LAPAROTOMY;  Surgeon: Ernestene Mention, MD;  Location: [facility];  Service: General;  Laterality: N/A;  left partial colectomy with colostomy   LAPAROTOMY  04/19/2012   Procedure: EXPLORATORY LAPAROTOMY;  Surgeon: Ernestene Mention, MD;  Location: [facility];  Service: General;  Laterality: N/A;   LAPAROTOMY N/A 11/29/2015   Procedure: EXPLORATORY LAPAROTOMY; SUBTOTAL COLECTOMY WITH HARTMAN PROCEDURE AND END COLOSTOMY;  Surgeon: Molli Hazard  Daphine Deutscher, MD;  Location: [facility];  Service: General;  Laterality: N/A;   TUBAL LIGATION     VENTRAL HERNIA REPAIR  04/19/2012   Procedure: HERNIA REPAIR VENTRAL ADULT;  Surgeon: Ernestene Mention, MD;  Location: [facility];  Service: General;  Laterality: N/A;     A IV Location/Drains/Wounds Patient Lines/Drains/Airways Status     Active Line/Drains/Airways     Name Placement date Placement time Site Days   Peripheral IV 05/10/23 20 G 1" Left;Posterior Hand 05/10/23  1737  Hand  less than 1             Intake/Output Last 24 hours  Intake/Output Summary (Last 24 hours) at 05/10/2023 2243 Last data filed at 05/10/2023 2042 Gross per 24 hour  Intake 50 ml  Output --  Net 50 ml    Labs/Imaging Results for orders placed or performed during the hospital encounter of 05/10/23 (from the past 48 hour(s))  Ethanol     Status: Abnormal   Collection Time: 05/10/23  6:11 PM  Result Value Ref Range   Alcohol, Ethyl (B) 219 (H) <10 mg/dL    Comment: (NOTE) Lowest detectable limit for serum alcohol is 10 mg/dL.  For medical purposes only. Performed at [facility], [address], [place], [place] [address]   Comprehensive metabolic panel     Status: Abnormal   Collection Time: 05/10/23  6:11 PM  Result Value Ref Range   Sodium 134 (L) 135 - 145 mmol/L   Potassium 2.4 (LL) 3.5 - 5.1 mmol/L    Comment: CRITICAL RESULT CALLED TO, READ BACK BY AND VERIFIED WITH Trygg Mantz Minihan,A ON 05/10/23 AT 1914 BY LOY,C   Chloride 96 (L) 98 - 111 mmol/L   CO2 21 (L) 22 - 32 mmol/L   Glucose, Bld 104 (H) 70 - 99 mg/dL    Comment: Glucose reference range applies only to samples taken after fasting for at least 8 hours.   BUN 6 (L) 8 - 23 mg/dL   Creatinine, Ser 5.62 0.44 - 1.00 mg/dL   Calcium 7.8 (L) 8.9 - 10.3 mg/dL   Total Protein 6.1 (L) 6.5 - 8.1 g/dL   Albumin 2.8 (L) 3.5 - 5.0 g/dL   AST 76 (H) 15 - 41 U/L   ALT 35 0 - 44 U/L   Alkaline Phosphatase 113 38 - 126 U/L   Total Bilirubin 1.2 (H) <1.2 mg/dL   GFR, Estimated >13 >08 mL/min    Comment: (NOTE) Calculated using the CKD-EPI Creatinine Equation (2021)    Anion gap 17 (H) 5 - 15    Comment: Performed at [facility], [address], [place], [place] [address]  Lipase, blood     Status: None   Collection Time: 05/10/23  6:11 PM  Result Value Ref Range   Lipase 21 11 - 51 U/L    Comment: Performed at [facility], [address] [place], [place] [address]  CBC with Differential     Status: Abnormal   Collection Time: 05/10/23   6:11 PM  Result Value Ref Range   WBC 10.7 (H) 4.0 - 10.5 K/uL   RBC 4.34 3.87 - 5.11 MIL/uL   Hemoglobin 12.0 12.0 - 15.0 g/dL   HCT 52.8 41.3 - 24.4 %   MCV 88.9 80.0 - 100.0 fL   MCH 27.6 26.0 - 34.0 pg   MCHC 31.1 30.0 - 36.0 g/dL   RDW 01.0 (H) 27.2 - 53.6 %   Platelets 352 150 - 400 K/uL  nRBC 0.0 0.0 - 0.2 %   Neutrophils Relative % 70 %   Neutro Abs 7.5 1.7 - 7.7 K/uL   Lymphocytes Relative 16 %   Lymphs Abs 1.7 0.7 - 4.0 K/uL   Monocytes Relative 8 %   Monocytes Absolute 0.9 0.1 - 1.0 K/uL   Eosinophils Relative 5 %   Eosinophils Absolute 0.5 0.0 - 0.5 K/uL   Basophils Relative 0 %   Basophils Absolute 0.0 0.0 - 0.1 K/uL   Immature Granulocytes 1 %   Abs Immature Granulocytes 0.12 (H) 0.00 - 0.07 K/uL    Comment: Performed at [facility], [address], [place], [place] [address]  Troponin I (High Sensitivity)     Status: None   Collection Time: 05/10/23  6:11 PM  Result Value Ref Range   Troponin I (High Sensitivity) 13 <18 ng/L    Comment: (NOTE) Elevated high sensitivity troponin I (hsTnI) values and significant  changes across serial measurements may suggest ACS but many other  chronic and acute conditions are known to elevate hsTnI results.  Refer to the "Links" section for chest pain algorithms and additional  guidance. Performed at [facility], [address] [place], [place] [address]   Magnesium     Status: Abnormal   Collection Time: 05/10/23  6:11 PM  Result Value Ref Range   Magnesium 1.4 (L) 1.7 - 2.4 mg/dL    Comment: Performed at [facility], [address], [place], [place] [address]  Troponin I (High Sensitivity)     Status: None   Collection Time: 05/10/23  8:10 PM  Result Value Ref Range   Troponin I (High Sensitivity) 13 <18 ng/L    Comment: (NOTE) Elevated high sensitivity troponin I (hsTnI) values and significant  changes across serial measurements may suggest ACS but many other  chronic and acute conditions are known to elevate hsTnI  results.  Refer to the "Links" section for chest pain algorithms and additional  guidance. Performed at [facility], [address], [place], [place] [address]    *Note: Due to a large number of results and/or encounters for the requested time period, some results have not been displayed. A complete set of results can be found in Results Review.   DG Chest Portable 1 View  Result Date: 05/10/2023 CLINICAL DATA:  Chest pain EXAM: PORTABLE CHEST 1 VIEW COMPARISON:  03/22/2023 FINDINGS: Linear density at the left base compatible with atelectasis. Right lung clear. Heart and mediastinal contours within normal limits. Aortic atherosclerosis. No effusions or acute bony abnormality. IMPRESSION: Left base atelectasis.  No active disease. Electronically Signed   By: Charlett Nose M.D.   On: 05/10/2023 19:34    Pending Labs [employer] (From admission, onward)     Start     Ordered   Signed and Held  Comprehensive metabolic panel  Tomorrow morning,   R        Signed and Held   Signed and Held  Magnesium  Tomorrow morning,   R        Signed and Held   Signed and Held  CBC with Differential/Platelet  Tomorrow morning,   R        Signed and Held   Signed and Held  TSH  Tomorrow morning,   R        Signed and Held   Signed and Held  Urinalysis, Routine w reflex microscopic -Urine, Clean Catch  Once,   R       Question:  Specimen Source  Answer:  Urine, Clean Catch   Signed and Held            Vitals/Pain Today's Vitals   05/10/23 2045 05/10/23 2144 05/10/23 2145 05/10/23 2215  BP: 114/61   137/73  Pulse: 87   94  Resp: 20   16  Temp:   98.3 F (36.8 C)   TempSrc:   Oral   SpO2: 94%   94%  Weight:      Height:      PainSc:  8       Isolation Precautions No active isolations  Medications Medications  potassium chloride 10 mEq in 100 mL IVPB (10 mEq Intravenous New Bag/Given 05/10/23 2144)  lactated ringers bolus 1,000 mL (1,000 mLs Intravenous Bolus 05/10/23 1741)  potassium  chloride SA (KLOR-CON M) CR tablet 40 mEq (40 mEq Oral Given 05/10/23 1941)  magnesium sulfate IVPB 2 g 50 mL (0 g Intravenous Stopped 05/10/23 2042)  traMADol (ULTRAM) tablet 50 mg (50 mg Oral Given 05/10/23 2049)    Mobility walks     Focused Assessments See chart   R Recommendations: See Admitting Provider Note  Report given to:   Additional Notes: see chart

## 2023-05-10 NOTE — ED Triage Notes (Signed)
BIB EMS said called out for breathing problems but when they showed up, the pt was sitting on the porch yelling, therefore airway was clear. Pt. Keeps speaking of boyfriend and brother. Not diabetic.

## 2023-05-10 NOTE — ED Provider Notes (Signed)
[place] EMERGENCY DEPARTMENT AT [facility] Provider Note   CSN: [id number] Arrival date & time: 05/10/23  1620     History  Chief Complaint  Patient presents with   Alcohol Intoxication   Abdominal Pain    Stacy Dalton is a 72 y.o. female.   Alcohol Intoxication Associated symptoms include chest pain. Pertinent negatives include no abdominal pain, no headaches and no shortness of breath.  Abdominal Pain Associated symptoms: chest pain   Associated symptoms: no chills, no dysuria, no fever, no nausea, no shortness of breath and no vomiting         Stacy Dalton is a 71 y.o. female with past medical history of GERD, hypertension, chronic pain, EtOH use, COPD who presents to the Emergency Department complaining of mid to lower back pain and chest pain.  She states this is an ongoing problem for her.  States she used to take tramadol for her back pain but stopped going to her PCP, instead she began drinking alcohol to help control her pain.  She states she typically drinks several shots of liquor per day.  Admits to drinking alcohol earlier today.  She arrived by EMS, but does not know who contacted them. Per EMS report, call was for difficulty breathing.  Patient was found sitting on her front porch with no pants on and covered in a sheet yelling out.  Calling for her boyfriend and her brother.  Patient tells me that she lives alone.  Denies any abdominal pain or vomiting.  Stated to me that she does not want to live in constant pain and that she is ready "if the [facility] wants to take me, I am ready to go."  When asked if she was suicidal, patient denies being suicidal, states she would not harm herself or others.  Denies any auditory or visual hallucinations.    Home Medications Prior to Admission medications   Medication Sig Start Date End Date Taking? Authorizing Provider  albuterol (VENTOLIN HFA) 108 (90 Base) MCG/ACT inhaler Inhale 2 puffs into the lungs  every 4 (four) hours as needed for wheezing or shortness of breath. 03/28/22   Shon Hale, MD  amLODipine (NORVASC) 10 MG tablet Take 1 tablet (10 mg total) by mouth daily. 03/28/22   Shon Hale, MD  hydrOXYzine (VISTARIL) 25 MG capsule Take 1 capsule (25 mg total) by mouth every 4 (four) hours as needed for anxiety or nausea. 03/28/22   Shon Hale, MD  levothyroxine (SYNTHROID) 50 MCG tablet Take 1 tablet (50 mcg total) by mouth daily before breakfast. 03/28/22   Mariea Clonts, Courage, MD  losartan (COZAAR) 50 MG tablet Take 1 tablet (50 mg total) by mouth daily. Patient not taking: Reported on 04/06/2022 03/28/22   Shon Hale, MD  metoprolol tartrate (LOPRESSOR) 25 MG tablet Take 1 tablet (25 mg total) by mouth 2 (two) times daily. Patient not taking: Reported on 04/06/2022 03/28/22   Shon Hale, MD      Allergies    Librium [chlordiazepoxide], Toradol [ketorolac tromethamine], Butalbital-apap-caffeine, Demerol, Esgic [butalbital-apap-caffeine], Iodine, Limonene, Meperidine, and Pyridium [phenazopyridine]    Review of Systems   Review of Systems  Constitutional:  Negative for appetite change, chills and fever.  Respiratory:  Negative for chest tightness and shortness of breath.   Cardiovascular:  Positive for chest pain. Negative for leg swelling.  Gastrointestinal:  Negative for abdominal pain, nausea and vomiting.  Genitourinary:  Negative for dysuria.  Musculoskeletal:  Positive for back pain.  Neurological:  Negative for dizziness, seizures, weakness, numbness and headaches.  Psychiatric/Behavioral:  The patient is nervous/anxious.     Physical Exam Updated Vital Signs BP 99/61 (BP Location: Left Arm)   Pulse 78   Temp 97.6 F (36.4 C) (Oral)   Resp 20   Ht 5\' 1"  (1.549 m)   Wt 59 kg   SpO2 93%   BMI 24.58 kg/m  Physical Exam Vitals and nursing note reviewed.  Constitutional:      General: She is not in acute distress.    Appearance: Normal appearance.  She is not ill-appearing.     Comments: Patient tearful on exam  HENT:     Mouth/Throat:     Mouth: Mucous membranes are moist.  Eyes:     Conjunctiva/sclera: Conjunctivae normal.  Cardiovascular:     Rate and Rhythm: Normal rate and regular rhythm.     Pulses: Normal pulses.  Pulmonary:     Effort: Pulmonary effort is normal.  Abdominal:     Palpations: Abdomen is soft.     Tenderness: There is no abdominal tenderness.  Musculoskeletal:        General: Normal range of motion.     Right lower leg: No edema.     Left lower leg: No edema.     Comments: Diffuse tenderness palpation bilateral  lumbar paraspinal muscles.  Negative straight leg raise bilaterally  Skin:    General: Skin is warm.     Capillary Refill: Capillary refill takes less than 2 seconds.  Neurological:     General: No focal deficit present.     Mental Status: She is alert.     Sensory: No sensory deficit.     Motor: No weakness.     ED Results / Procedures / Treatments   Labs (all labs ordered are listed, but only abnormal results are displayed) Labs Reviewed  ETHANOL - Abnormal; Notable for the following components:      Result Value   Alcohol, Ethyl (B) 219 (*)    All other components within normal limits  COMPREHENSIVE METABOLIC PANEL - Abnormal; Notable for the following components:   Sodium 134 (*)    Potassium 2.4 (*)    Chloride 96 (*)    CO2 21 (*)    Glucose, Bld 104 (*)    BUN 6 (*)    Calcium 7.8 (*)    Total Protein 6.1 (*)    Albumin 2.8 (*)    AST 76 (*)    Total Bilirubin 1.2 (*)    Anion gap 17 (*)    All other components within normal limits  CBC WITH DIFFERENTIAL/PLATELET - Abnormal; Notable for the following components:   WBC 10.7 (*)    RDW 15.7 (*)    Abs Immature Granulocytes 0.12 (*)    All other components within normal limits  MAGNESIUM - Abnormal; Notable for the following components:   Magnesium 1.4 (*)    All other components within normal limits  LIPASE, BLOOD   TROPONIN I (HIGH SENSITIVITY)  TROPONIN I (HIGH SENSITIVITY)    EKG EKG Interpretation Date/Time:  Thursday May 10 2023 16:55:22 EST Ventricular Rate:  77 PR Interval:  135 QRS Duration:  90 QT Interval:  404 QTC Calculation: 458 R Axis:   77  Text Interpretation: Sinus rhythm No significant change since last tracing Confirmed by Jacalyn Lefevre [phone]) on 05/10/2023 9:18:56 PM  Radiology DG Chest Portable 1 View  Result Date: 05/10/2023 CLINICAL DATA:  Chest pain EXAM: PORTABLE CHEST  1 VIEW COMPARISON:  03/22/2023 FINDINGS: Linear density at the left base compatible with atelectasis. Right lung clear. Heart and mediastinal contours within normal limits. Aortic atherosclerosis. No effusions or acute bony abnormality. IMPRESSION: Left base atelectasis.  No active disease. Electronically Signed   By: Charlett Nose M.D.   On: 05/10/2023 19:34    Procedures Procedures    Medications Ordered in ED Medications  lactated ringers bolus 1,000 mL (has no administration in time range)    ED Course/ Medical Decision Making/ A&P                                 Medical Decision Making Patient here with complaint of low back pain which she states is chronic also complaining of chest pain which she states is associated with her back pain.  Has similar symptoms daily.  Has been evaluated here several times for same.  Drinks alcohol daily she states call helps her control her back pain.  She was taking tramadol for her back but stopped going to her primary care provider Admits to drinking liquor today.  Does not appear to be in acute withdrawal.  No abdominal pain on my exam, no saddle anesthesias urine or bowel changes.  Differential includes acute alcohol intoxication, alcohol withdrawal, back pain secondary to cauda equina, acute on chronic pain, dissection also considered but felt less likely   Spoke with patient's brother, Mr. Donnie Aho.  He was not aware that patient was here in the  emergency department.  He does state that she typically drinks alcohol frequently and has ongoing back pain.   Patient had CT abdomen and pelvis and lumbar spine on 03/21/2023.  Both without acute findings.  Amount and/or Complexity of Data Reviewed Labs: ordered.    Details: Labs interpreted by me, no significant leukocytosis, hemoglobin unremarkable.  Blood alcohol 219.  Chemistries show hypokalemia with potassium of 2.4 magnesium 1.4 troponin reassuring Radiology: ordered.    Details: Chest x-ray shows left base atelectasis with no active disease ECG/medicine tests: ordered.    Details: EKG shows sinus rhythm no significant change from last tracing Discussion of management or test interpretation with external provider(s): No abdominal pain on my exam she had CT abdomen and pelvis with lumbar spine in September of this year without acute findings.  No indication for repeat imaging of her lumbar spine or abdomen today.  CIWA score here of 2.  Patient resting comfortably.  No symptoms of alcohol withdrawal at this time.  Pain addressed here with tramadol which she has taken in the past.  Hypokalemic hypomagnesemia, IV fluids and IV potassium and magnesium ordered.  I have discussed with [facility] hospitalist, Dr. Carren Rang who agrees to admit.   Risk Prescription drug management.   CIWA 2        Final Clinical Impression(s) / ED Diagnoses Final diagnoses:  Alcoholic intoxication without complication (HCC)  Hypokalemia    Rx / DC Orders ED Discharge Orders     None         Pauline Aus, PA-C 05/10/23 2323    Jacalyn Lefevre, MD 05/11/23 1551

## 2023-05-10 NOTE — ED Notes (Signed)
Pt

## 2023-05-11 ENCOUNTER — Inpatient Hospital Stay (HOSPITAL_COMMUNITY): Payer: Medicare Other

## 2023-05-11 DIAGNOSIS — F10120 Alcohol abuse with intoxication, uncomplicated: Secondary | ICD-10-CM | POA: Diagnosis present

## 2023-05-11 DIAGNOSIS — Z885 Allergy status to narcotic agent status: Secondary | ICD-10-CM | POA: Diagnosis not present

## 2023-05-11 DIAGNOSIS — E039 Hypothyroidism, unspecified: Secondary | ICD-10-CM

## 2023-05-11 DIAGNOSIS — R410 Disorientation, unspecified: Secondary | ICD-10-CM

## 2023-05-11 DIAGNOSIS — G9341 Metabolic encephalopathy: Secondary | ICD-10-CM | POA: Diagnosis present

## 2023-05-11 DIAGNOSIS — I10 Essential (primary) hypertension: Secondary | ICD-10-CM

## 2023-05-11 DIAGNOSIS — E519 Thiamine deficiency, unspecified: Secondary | ICD-10-CM | POA: Diagnosis present

## 2023-05-11 DIAGNOSIS — F1010 Alcohol abuse, uncomplicated: Secondary | ICD-10-CM

## 2023-05-11 DIAGNOSIS — R531 Weakness: Secondary | ICD-10-CM

## 2023-05-11 DIAGNOSIS — M545 Low back pain, unspecified: Secondary | ICD-10-CM | POA: Diagnosis present

## 2023-05-11 DIAGNOSIS — G8929 Other chronic pain: Secondary | ICD-10-CM | POA: Diagnosis present

## 2023-05-11 DIAGNOSIS — Z8349 Family history of other endocrine, nutritional and metabolic diseases: Secondary | ICD-10-CM | POA: Diagnosis not present

## 2023-05-11 DIAGNOSIS — E876 Hypokalemia: Secondary | ICD-10-CM

## 2023-05-11 DIAGNOSIS — Z888 Allergy status to other drugs, medicaments and biological substances status: Secondary | ICD-10-CM | POA: Diagnosis not present

## 2023-05-11 DIAGNOSIS — Z7141 Alcohol abuse counseling and surveillance of alcoholic: Secondary | ICD-10-CM | POA: Diagnosis not present

## 2023-05-11 DIAGNOSIS — K219 Gastro-esophageal reflux disease without esophagitis: Secondary | ICD-10-CM | POA: Diagnosis present

## 2023-05-11 DIAGNOSIS — F10920 Alcohol use, unspecified with intoxication, uncomplicated: Principal | ICD-10-CM

## 2023-05-11 DIAGNOSIS — J441 Chronic obstructive pulmonary disease with (acute) exacerbation: Secondary | ICD-10-CM | POA: Diagnosis present

## 2023-05-11 DIAGNOSIS — Z90710 Acquired absence of both cervix and uterus: Secondary | ICD-10-CM | POA: Diagnosis not present

## 2023-05-11 DIAGNOSIS — Z79899 Other long term (current) drug therapy: Secondary | ICD-10-CM | POA: Diagnosis not present

## 2023-05-11 DIAGNOSIS — R0600 Dyspnea, unspecified: Secondary | ICD-10-CM | POA: Diagnosis not present

## 2023-05-11 DIAGNOSIS — Z8249 Family history of ischemic heart disease and other diseases of the circulatory system: Secondary | ICD-10-CM | POA: Diagnosis not present

## 2023-05-11 DIAGNOSIS — R918 Other nonspecific abnormal finding of lung field: Secondary | ICD-10-CM | POA: Diagnosis not present

## 2023-05-11 DIAGNOSIS — Z79890 Hormone replacement therapy (postmenopausal): Secondary | ICD-10-CM | POA: Diagnosis not present

## 2023-05-11 DIAGNOSIS — Z87891 Personal history of nicotine dependence: Secondary | ICD-10-CM | POA: Diagnosis not present

## 2023-05-11 DIAGNOSIS — I700 Atherosclerosis of aorta: Secondary | ICD-10-CM | POA: Diagnosis not present

## 2023-05-11 DIAGNOSIS — Z9049 Acquired absence of other specified parts of digestive tract: Secondary | ICD-10-CM | POA: Diagnosis not present

## 2023-05-11 DIAGNOSIS — Y907 Blood alcohol level of 200-239 mg/100 ml: Secondary | ICD-10-CM | POA: Diagnosis present

## 2023-05-11 DIAGNOSIS — Z825 Family history of asthma and other chronic lower respiratory diseases: Secondary | ICD-10-CM | POA: Diagnosis not present

## 2023-05-11 DIAGNOSIS — R0989 Other specified symptoms and signs involving the circulatory and respiratory systems: Secondary | ICD-10-CM | POA: Diagnosis not present

## 2023-05-11 LAB — CBC WITH DIFFERENTIAL/PLATELET
Abs Immature Granulocytes: 0.15 10*3/uL — ABNORMAL HIGH (ref 0.00–0.07)
Basophils Absolute: 0.1 10*3/uL (ref 0.0–0.1)
Basophils Relative: 1 %
Eosinophils Absolute: 0.5 10*3/uL (ref 0.0–0.5)
Eosinophils Relative: 3 %
HCT: 38.3 % (ref 36.0–46.0)
Hemoglobin: 12.1 g/dL (ref 12.0–15.0)
Immature Granulocytes: 1 %
Lymphocytes Relative: 10 %
Lymphs Abs: 1.5 10*3/uL (ref 0.7–4.0)
MCH: 28 pg (ref 26.0–34.0)
MCHC: 31.6 g/dL (ref 30.0–36.0)
MCV: 88.7 fL (ref 80.0–100.0)
Monocytes Absolute: 1.3 10*3/uL — ABNORMAL HIGH (ref 0.1–1.0)
Monocytes Relative: 9 %
Neutro Abs: 11.5 10*3/uL — ABNORMAL HIGH (ref 1.7–7.7)
Neutrophils Relative %: 76 %
Platelets: 392 10*3/uL (ref 150–400)
RBC: 4.32 MIL/uL (ref 3.87–5.11)
RDW: 15.7 % — ABNORMAL HIGH (ref 11.5–15.5)
WBC: 15.1 10*3/uL — ABNORMAL HIGH (ref 4.0–10.5)
nRBC: 0 % (ref 0.0–0.2)

## 2023-05-11 LAB — COMPREHENSIVE METABOLIC PANEL
ALT: 42 U/L (ref 0–44)
AST: 118 U/L — ABNORMAL HIGH (ref 15–41)
Albumin: 2.8 g/dL — ABNORMAL LOW (ref 3.5–5.0)
Alkaline Phosphatase: 119 U/L (ref 38–126)
Anion gap: 11 (ref 5–15)
BUN: 6 mg/dL — ABNORMAL LOW (ref 8–23)
CO2: 22 mmol/L (ref 22–32)
Calcium: 8 mg/dL — ABNORMAL LOW (ref 8.9–10.3)
Chloride: 101 mmol/L (ref 98–111)
Creatinine, Ser: 0.73 mg/dL (ref 0.44–1.00)
GFR, Estimated: >60 mL/min (ref >60)
Glucose, Bld: 128 mg/dL — ABNORMAL HIGH (ref 70–99)
Potassium: 3.7 mmol/L (ref 3.5–5.1)
Sodium: 134 mmol/L — ABNORMAL LOW (ref 135–145)
Total Bilirubin: 1.4 mg/dL — ABNORMAL HIGH (ref <1.2)
Total Protein: 6 g/dL — ABNORMAL LOW (ref 6.5–8.1)

## 2023-05-11 LAB — PROCALCITONIN: Procalcitonin: 0.25 ng/mL

## 2023-05-11 LAB — MAGNESIUM: Magnesium: 1.9 mg/dL (ref 1.7–2.4)

## 2023-05-11 LAB — TSH: TSH: 6.964 u[IU]/mL — ABNORMAL HIGH (ref 0.350–4.500)

## 2023-05-11 MED ORDER — ONDANSETRON HCL 4 MG PO TABS
4.0000 mg | ORAL_TABLET | Freq: Four times a day (QID) | ORAL | Status: DC | PRN
  Administered 2023-05-11: 4 mg via ORAL
  Filled 2023-05-11: qty 1

## 2023-05-11 MED ORDER — ALBUTEROL SULFATE (2.5 MG/3ML) 0.083% IN NEBU
2.5000 mg | INHALATION_SOLUTION | RESPIRATORY_TRACT | Status: DC | PRN
  Administered 2023-05-11: 2.5 mg via RESPIRATORY_TRACT
  Filled 2023-05-11: qty 3

## 2023-05-11 MED ORDER — ACETAMINOPHEN 325 MG PO TABS: 650.0000 mg | ORAL_TABLET | Freq: Four times a day (QID) | ORAL | Status: DC | PRN

## 2023-05-11 MED ORDER — HEPARIN SODIUM (PORCINE) 5000 UNIT/ML IJ SOLN
5000.0000 [IU] | Freq: Three times a day (TID) | INTRAMUSCULAR | Status: DC
  Administered 2023-05-11 – 2023-05-14 (×10): 5000 [IU] via SUBCUTANEOUS
  Filled 2023-05-11 (×10): qty 1

## 2023-05-11 MED ORDER — OXYCODONE HCL 5 MG PO TABS
5.0000 mg | ORAL_TABLET | ORAL | Status: DC | PRN
  Administered 2023-05-11 – 2023-05-14 (×15): 5 mg via ORAL
  Filled 2023-05-11 (×15): qty 1

## 2023-05-11 MED ORDER — HYDROXYZINE HCL 25 MG PO TABS
25.0000 mg | ORAL_TABLET | ORAL | Status: DC | PRN
  Administered 2023-05-11 – 2023-05-14 (×6): 25 mg via ORAL
  Filled 2023-05-11 (×6): qty 1

## 2023-05-11 MED ORDER — LEVOTHYROXINE SODIUM 50 MCG PO TABS
50.0000 ug | ORAL_TABLET | Freq: Every day | ORAL | Status: DC
  Administered 2023-05-11 – 2023-05-14 (×4): 50 ug via ORAL
  Filled 2023-05-11 (×4): qty 1

## 2023-05-11 MED ORDER — ONDANSETRON HCL 4 MG/2ML IJ SOLN
4.0000 mg | Freq: Four times a day (QID) | INTRAMUSCULAR | Status: DC | PRN
  Administered 2023-05-11 – 2023-05-12 (×3): 4 mg via INTRAVENOUS
  Filled 2023-05-11 (×3): qty 2

## 2023-05-11 MED ORDER — ACETAMINOPHEN 650 MG RE SUPP: 650.0000 mg | Freq: Four times a day (QID) | RECTAL | Status: DC | PRN

## 2023-05-11 NOTE — Assessment & Plan Note (Signed)
Check TSH.  Continue Synthroid. 

## 2023-05-11 NOTE — Evaluation (Signed)
Physical Therapy Evaluation Patient Details Name: Stacy Dalton MRN: [id number] DOB: 04-17-1951 Today's Date: 05/11/2023  History of Present Illness  Per WU:[id number] D Ellender is a 72 y.o. female with medical history significant of COPD, alcohol abuse, GERD, hypothyroid disease, hypertension, chronic pain, and more presents the ED with a chief complaint of altered mental status.  EMS reports that the call was for shortness of breath, but when they got there patient was sitting on the porch with no pants on, covered with a blanket, yelling for her boyfriend.  Patient has no recollection of this.  The last thing she remembers is sitting on her couch talking on her cell phone.  Patient does not know what time the day that was.  Patient reports that she did not drink as much yesterday as she normally does.  She then says she is not sure how much she drank.  Regularly, she drinks most of a bottle of whiskey, and her boyfriend share some of it per her report.  Patient reports that she has been feeling generalized weakness over the last couple weeks.  She reports that she cannot take care of herself at home anymore and she needs to go to rehab or permanently to SNF.  Patient reports that while she does live with her boyfriend, he can barely stand up himself, so they cannot take care of each other.  Patient denies any current infectious symptoms.  She reports that she had diarrhea some days ago but is not exactly sure when.  She reports no abdominal pain, no vomiting.  She does report dysuria and her UA is pending.  She has chronic back pain has been going on for decades.  She denies any rashes or sores on her body.  Patient does report that she has not eaten much in for 5 days.  This likely accounts for her electrolyte derangements.  Patient has no other complaints at this time.  Clinical Impression  Pt is pleasant and cooperative.  Able to demonstrate I in bed mobility and getting on and off the commode.  PT  ambulated x 70 ft with RW stating that she was slightly SOB at end of treatment.         If plan is discharge home, recommend the following: Assistance with cooking/housework;A little help with bathing/dressing/bathroom;Help with stairs or ramp for entrance;Assist for transportation   Can travel by private vehicle    yes    Equipment Recommendations None recommended by PT  Recommendations for Other Services    OT   Functional Status Assessment Patient has had a recent decline in their functional status and demonstrates the ability to make significant improvements in function in a reasonable and predictable amount of time.     Precautions / Restrictions Precautions Precautions: None Restrictions Weight Bearing Restrictions: No      Mobility  Bed Mobility Overal bed mobility: Modified Independent                  Transfers Overall transfer level: Modified independent                      Ambulation/Gait Ambulation/Gait assistance: Modified independent (Device/Increase time) Gait Distance (Feet): 70 Feet Assistive device: Rolling walker (2 wheels) Gait Pattern/deviations: Decreased step length - right, Decreased step length - left               Pertinent Vitals/Pain Pain Assessment Pain Assessment: 0-10 (Pt has just been given a pain pill) Pain  Score: 0-No pain    Home Living Family/patient expects to be discharged to:: Skilled nursing facility Living Arrangements: Spouse/significant other Available Help at Discharge: Available PRN/intermittently Type of Home: House Home Access: Stairs to enter   Entrance Stairs-Number of Steps: 3   Home Layout: One level Home Equipment: Agricultural consultant (2 wheels)      Prior Function Prior Level of Function : Needs assist             Mobility Comments: RW ADLs Comments: states she is no longer able to cook for herself     Extremity/Trunk Assessment        Lower Extremity Assessment Lower  Extremity Assessment: Overall WFL for tasks assessed       Communication   Communication Communication: No apparent difficulties Cueing Techniques: Verbal cues  Cognition Arousal: Alert Behavior During Therapy: WFL for tasks assessed/performed Overall Cognitive Status: Within Functional Limits for tasks assessed                                                 Assessment/Plan    PT Assessment Patient needs continued PT services  PT Problem List Decreased strength;Decreased activity tolerance;Decreased balance       PT Treatment Interventions Therapeutic activities;Therapeutic exercise    PT Goals (Current goals can be found in the Care Plan section)  Acute Rehab PT Goals Patient Stated Goal: Pt feels she can not take care of herself and would like to go to a facility Potential to Achieve Goals: Fair    Frequency Min 2X/week        AM-PAC PT "6 Clicks" Mobility  Outcome Measure Help needed turning from your back to your side while in a flat bed without using bedrails?: None Help needed moving from lying on your back to sitting on the side of a flat bed without using bedrails?: None Help needed moving to and from a bed to a chair (including a wheelchair)?: None Help needed standing up from a chair using your arms (e.g., wheelchair or bedside chair)?: A Little Help needed to walk in hospital room?: A Little Help needed climbing 3-5 steps with a railing? : A Little 6 Click Score: 21    End of Session Equipment Utilized During Treatment: Gait belt Activity Tolerance: Patient tolerated treatment well;Patient limited by fatigue Patient left: in bed;with call bell/phone within reach Nurse Communication: Mobility status PT Visit Diagnosis: Unsteadiness on feet (R26.81);Muscle weakness (generalized) (M62.81)    Time: 1610-9604 PT Time Calculation (min) (ACUTE ONLY): 18 min   Charges:   PT Evaluation $PT Eval Low Complexity: 1 Low   PT General  Charges $$ ACUTE PT VISIT: 1 Visit       Virgina Organ, PT CLT [phone]  05/11/2023, 3:25 PM

## 2023-05-11 NOTE — Assessment & Plan Note (Signed)
-   Related to RUE electrolyte derangement?? - Likely has thiamine deficiency given alcohol use - Continue thiamine - Consult PT - Monitor electrolytes and replace as indicated - Troponin 13, 13 - UA pending - Patient requesting placement saying that she cannot take care of herself anymore at home

## 2023-05-11 NOTE — TOC Initial Note (Addendum)
Transition of Care [facility]) - Initial/Assessment Note    Patient Details  Name: Stacy Dalton MRN: [id number] Date of Birth: May 06, 1951  [facility] CM/SW Contact:    Villa Herb, LCSWA Phone Number: 05/11/2023, 3:27 PM  Clinical Narrative:                 [facility] consulted for SNF placement. CSW spoke with pt about this and pt states that she will need SNF as she has gotten weaker. Pt states she would like to [facility] as she has been there in the past. CSW to send out SNF referral. TOC to follow.   Expected Discharge Plan: Skilled Nursing Facility Barriers to Discharge: Continued Medical Work up   Patient Goals and CMS Choice Patient states their goals for this hospitalization and ongoing recovery are:: SNF CMS Medicare.gov Compare Post Acute Care list provided to:: Patient Choice offered to / list presented to : Patient [place] ownership interest in [facility].provided to:: Patient    Expected Discharge Plan and Services In-house Referral: Clinical Social Work Discharge Planning Services: CM Consult Post Acute Care Choice: Skilled Nursing Facility Living arrangements for the past 2 months: Single Family Home                                      Prior Living Arrangements/Services Living arrangements for the past 2 months: Single Family Home Lives with:: Significant Other Patient language and need for interpreter reviewed:: Yes Do you feel safe going back to the place where you live?: Yes      Need for Family Participation in Patient Care: Yes (Comment) Care giver support system in place?: Yes (comment) Current home services: DME (walker) Criminal Activity/Legal Involvement Pertinent to Current Situation/Hospitalization: No - Comment as needed  Activities of Daily Living   ADL Screening (condition at time of admission) Independently performs ADLs?: Yes (appropriate for developmental age) Is the patient deaf or have  difficulty hearing?: No Does the patient have difficulty seeing, even when wearing glasses/contacts?: No Does the patient have difficulty concentrating, remembering, or making decisions?: No  Permission Sought/Granted                  Emotional Assessment Appearance:: Appears stated age Attitude/Demeanor/Rapport: Engaged Affect (typically observed): Accepting Orientation: : Oriented to Self, Oriented to Place, Oriented to  Time, Oriented to Situation Alcohol / Substance Use: Not Applicable Psych Involvement: No (comment)  Admission diagnosis:  Hypokalemia [E87.6] Altered mental status [R41.82] Alcoholic intoxication without complication (HCC) [F10.920] Acute metabolic encephalopathy [G93.41] Patient Active Problem List   Diagnosis Date Noted   GERD (gastroesophageal reflux disease) 10/10/2021   Intractable nausea and vomiting 10/09/2021   Alcoholic ketoacidosis 09/18/2021   Hypertensive urgency 08/27/2021   Esophagitis 08/27/2021   Atypical chest pain 08/27/2021   Seizure-like activity (HCC) 06/06/2021   DTs (delirium tremens) (HCC) 06/06/2021   AKI (acute kidney injury) (HCC) 05/19/2021   Elevated LFTs 05/19/2021   Lactic acidosis 05/19/2021   COVID-19 virus infection 04/15/2021    Class: Acute   Hypotensive episode 04/15/2021    Class: Acute   Physical deconditioning    Vitamin B12 deficiency 02/04/2021   Leukocytosis 11/23/2020   Alcohol use disorder, severe, dependence (HCC) 10/08/2020   Hyponatremia 09/17/2020   Alcohol withdrawal (HCC) 09/12/2020   Moderate protein malnutrition (HCC) 08/20/2020   Elevated SGOT (AST) 08/19/2020   Elevated  troponin 08/19/2020   Transaminitis 08/19/2020   Alcohol abuse 06/21/2020   Acute respiratory failure with hypoxia (HCC) 09/19/2018   Large bowel obstruction (HCC)    Small bowel obstruction (HCC) 07/20/2018   Hypomagnesemia 07/20/2018   Generalized weakness 03/14/2018   Polypharmacy 02/23/2017   Hypokalemia 02/23/2017    Acute metabolic encephalopathy 02/22/2017   Anxiety 02/22/2017   Altered mental status    Pain medication agreement signed 10/10/2016   Opioid dependence (HCC) 10/10/2016   Status post colostomy takedown 04/12/2016   Chronic constipation 12/03/2015   Peritonitis with abscess of intestine (HCC) 11/29/2015   COPD exacerbation (HCC) 09/20/2015   Osteopenia 06/10/2014   Vitamin D deficiency 06/10/2014   Hypothyroidism    Depression 11/18/2012   Insomnia 11/18/2012   Chronic pain syndrome 11/18/2012   HLD (hyperlipidemia) 09/01/2012   S/P colostomy (HCC) 04/19/2012   Normocytic anemia 10/15/2011   Hypertension 10/13/2011   Chronic back pain 10/13/2011   COPD (chronic obstructive pulmonary disease) (HCC) 10/09/2011   PCP:  Pcp, No Pharmacy:   [id number] - [place], [place] - [address] [place] [address] Phone: [phone] Fax: [phone]     Social Determinants of Health (SDOH) Social History: SDOH Screenings   Food Insecurity: No Food Insecurity (05/10/2023)  Housing: Low Risk  (05/10/2023)  Transportation Needs: No Transportation Needs (05/10/2023)  Utilities: Not At Risk (05/10/2023)  Alcohol Screen: High Risk (03/01/2021)  Depression (PHQ2-9): High Risk (06/01/2021)  Financial Resource Strain: Low Risk  (03/01/2021)  Physical Activity: Inactive (06/01/2021)  Social Connections: Socially Isolated (03/01/2021)  Stress: Stress Concern Present (06/01/2021)  Tobacco Use: Medium Risk (05/10/2023)   SDOH Interventions:     Readmission Risk Interventions    04/11/2022   11:48 AM 03/27/2022   10:41 AM 10/10/2021    9:08 AM  Readmission Risk Prevention Plan  Transportation Screening Complete Complete Complete  Medication Review Oceanographer) Complete Complete Complete  PCP or Specialist appointment within 3-5 days of discharge Complete    HRI or Home Care Consult Complete Complete  Complete  SW Recovery Care/Counseling Consult Complete Complete Complete  Palliative Care Screening Not Applicable Not Applicable Not Applicable  Skilled Nursing Facility Complete Not Applicable Not Applicable

## 2023-05-11 NOTE — Assessment & Plan Note (Signed)
-   Related to alcohol use?? - Alcohol level 219 - History of hypothyroidism, check TSH - Alert and oriented at the time of my exam

## 2023-05-11 NOTE — Assessment & Plan Note (Signed)
-   Magnesium 1.4 - 2 g given in the ED - Trend in the a.m.

## 2023-05-11 NOTE — Assessment & Plan Note (Signed)
-   Potassium 2.4 - Replaced in the ED and repeat was up to 3.0 - Continue potassium runs - Monitor on telemetry - Trend in the a.m.

## 2023-05-11 NOTE — Assessment & Plan Note (Signed)
-   Soft blood pressure down to 99/55 - Holding Norvasc, losartan, metoprolol

## 2023-05-11 NOTE — Progress Notes (Signed)
ASSUMPTION OF CARE NOTE   05/11/2023 6:07 PM  Stacy Dalton was seen and examined.  The H&P by the admitting provider, orders, imaging was reviewed.  Please see new orders.  Will continue to follow.   Vitals:   05/11/23 1139 05/11/23 1741  BP: 132/68 138/75  Pulse: 82 82  Resp: 17 17  Temp: 98.4 F (36.9 C) 98.3 F (36.8 C)  SpO2: 92% 92%    Results for orders placed or performed during the hospital encounter of 05/10/23  Ethanol  Result Value Ref Range   Alcohol, Ethyl (B) 219 (H) <10 mg/dL  Comprehensive metabolic panel  Result Value Ref Range   Sodium 134 (L) 135 - 145 mmol/L   Potassium 2.4 (LL) 3.5 - 5.1 mmol/L   Chloride 96 (L) 98 - 111 mmol/L   CO2 21 (L) 22 - 32 mmol/L   Glucose, Bld 104 (H) 70 - 99 mg/dL   BUN 6 (L) 8 - 23 mg/dL   Creatinine, Ser 8.11 0.44 - 1.00 mg/dL   Calcium 7.8 (L) 8.9 - 10.3 mg/dL   Total Protein 6.1 (L) 6.5 - 8.1 g/dL   Albumin 2.8 (L) 3.5 - 5.0 g/dL   AST 76 (H) 15 - 41 U/L   ALT 35 0 - 44 U/L   Alkaline Phosphatase 113 38 - 126 U/L   Total Bilirubin 1.2 (H) <1.2 mg/dL   GFR, Estimated >91 >47 mL/min   Anion gap 17 (H) 5 - 15  Lipase, blood  Result Value Ref Range   Lipase 21 11 - 51 U/L  CBC with Differential  Result Value Ref Range   WBC 10.7 (H) 4.0 - 10.5 K/uL   RBC 4.34 3.87 - 5.11 MIL/uL   Hemoglobin 12.0 12.0 - 15.0 g/dL   HCT 82.9 56.2 - 13.0 %   MCV 88.9 80.0 - 100.0 fL   MCH 27.6 26.0 - 34.0 pg   MCHC 31.1 30.0 - 36.0 g/dL   RDW 86.5 (H) 78.4 - 69.6 %   Platelets 352 150 - 400 K/uL   nRBC 0.0 0.0 - 0.2 %   Neutrophils Relative % 70 %   Neutro Abs 7.5 1.7 - 7.7 K/uL   Lymphocytes Relative 16 %   Lymphs Abs 1.7 0.7 - 4.0 K/uL   Monocytes Relative 8 %   Monocytes Absolute 0.9 0.1 - 1.0 K/uL   Eosinophils Relative 5 %   Eosinophils Absolute 0.5 0.0 - 0.5 K/uL   Basophils Relative 0 %   Basophils Absolute 0.0 0.0 - 0.1 K/uL   Immature Granulocytes 1 %   Abs Immature Granulocytes 0.12 (H) 0.00 - 0.07 K/uL   Magnesium  Result Value Ref Range   Magnesium 1.4 (L) 1.7 - 2.4 mg/dL  Basic metabolic panel  Result Value Ref Range   Sodium 137 135 - 145 mmol/L   Potassium 3.0 (L) 3.5 - 5.1 mmol/L   Chloride 99 98 - 111 mmol/L   CO2 21 (L) 22 - 32 mmol/L   Glucose, Bld 126 (H) 70 - 99 mg/dL   BUN 5 (L) 8 - 23 mg/dL   Creatinine, Ser 2.95 0.44 - 1.00 mg/dL   Calcium 7.8 (L) 8.9 - 10.3 mg/dL   GFR, Estimated >28 >41 mL/min   Anion gap 17 (H) 5 - 15  Comprehensive metabolic panel  Result Value Ref Range   Sodium 134 (L) 135 - 145 mmol/L   Potassium 3.7 3.5 - 5.1 mmol/L   Chloride 101 98 -  111 mmol/L   CO2 22 22 - 32 mmol/L   Glucose, Bld 128 (H) 70 - 99 mg/dL   BUN 6 (L) 8 - 23 mg/dL   Creatinine, Ser 1.61 0.44 - 1.00 mg/dL   Calcium 8.0 (L) 8.9 - 10.3 mg/dL   Total Protein 6.0 (L) 6.5 - 8.1 g/dL   Albumin 2.8 (L) 3.5 - 5.0 g/dL   AST 096 (H) 15 - 41 U/L   ALT 42 0 - 44 U/L   Alkaline Phosphatase 119 38 - 126 U/L   Total Bilirubin 1.4 (H) <1.2 mg/dL   GFR, Estimated >04 >54 mL/min   Anion gap 11 5 - 15  Magnesium  Result Value Ref Range   Magnesium 1.9 1.7 - 2.4 mg/dL  CBC with Differential/Platelet  Result Value Ref Range   WBC 15.1 (H) 4.0 - 10.5 K/uL   RBC 4.32 3.87 - 5.11 MIL/uL   Hemoglobin 12.1 12.0 - 15.0 g/dL   HCT 09.8 11.9 - 14.7 %   MCV 88.7 80.0 - 100.0 fL   MCH 28.0 26.0 - 34.0 pg   MCHC 31.6 30.0 - 36.0 g/dL   RDW 82.9 (H) 56.2 - 13.0 %   Platelets 392 150 - 400 K/uL   nRBC 0.0 0.0 - 0.2 %   Neutrophils Relative % 76 %   Neutro Abs 11.5 (H) 1.7 - 7.7 K/uL   Lymphocytes Relative 10 %   Lymphs Abs 1.5 0.7 - 4.0 K/uL   Monocytes Relative 9 %   Monocytes Absolute 1.3 (H) 0.1 - 1.0 K/uL   Eosinophils Relative 3 %   Eosinophils Absolute 0.5 0.0 - 0.5 K/uL   Basophils Relative 1 %   Basophils Absolute 0.1 0.0 - 0.1 K/uL   Immature Granulocytes 1 %   Abs Immature Granulocytes 0.15 (H) 0.00 - 0.07 K/uL  TSH  Result Value Ref Range   TSH 6.964 (H) 0.350 - 4.500  uIU/mL  Procalcitonin  Result Value Ref Range   Procalcitonin 0.25 ng/mL  Troponin I (High Sensitivity)  Result Value Ref Range   Troponin I (High Sensitivity) 13 <18 ng/L  Troponin I (High Sensitivity)  Result Value Ref Range   Troponin I (High Sensitivity) 13 <18 ng/L   *Note: Due to a large number of results and/or encounters for the requested time period, some results have not been displayed. A complete set of results can be found in Results Review.    Maryln Manuel, MD [facility]   05/10/2023  4:44 PM How to contact the [facility] Attending or Consulting provider 7A - 7P or covering provider during after hours 7P -7A, for this patient?  Check the care team in [facility] and look for a) attending/consulting TRH provider listed and b) the [facility] team listed Log into [email or web address] and use [place]'s universal password to access. If you do not have the password, please contact the hospital operator. Locate the [facility] provider you are looking for under [facility] and page to a number that you can be directly reached. If you still have difficulty reaching the provider, please page the [facility] (Director on Call) for the Hospitalists listed on amion for assistance.

## 2023-05-11 NOTE — [place] FL2 (Signed)
[place] MEDICAID FL2 LEVEL OF CARE FORM     IDENTIFICATION  Patient Name: Stacy Dalton Birthdate: [birth year] Sex: female Admission Date (Current Location): 05/10/2023  [facility] and [place] Number:  [employer] and Address:  [facility],  [address] [place] [address]      Provider Number: [id number]  Attending Physician Name and Address:  Cleora Fleet, MD  Relative Name and Phone Number:       Current Level of Care: Hospital Recommended Level of Care: Skilled Nursing Facility Prior Approval Number:    Date Approved/Denied:   PASRR Number: [id number]  Discharge Plan: SNF    Current Diagnoses: Patient Active Problem List   Diagnosis Date Noted   GERD (gastroesophageal reflux disease) 10/10/2021   Intractable nausea and vomiting 10/09/2021   Alcoholic ketoacidosis 09/18/2021   Hypertensive urgency 08/27/2021   Esophagitis 08/27/2021   Atypical chest pain 08/27/2021   Seizure-like activity (HCC) 06/06/2021   DTs (delirium tremens) (HCC) 06/06/2021   AKI (acute kidney injury) (HCC) 05/19/2021   Elevated LFTs 05/19/2021   Lactic acidosis 05/19/2021   COVID-19 virus infection 04/15/2021   Hypotensive episode 04/15/2021   Physical deconditioning    Vitamin B12 deficiency 02/04/2021   Leukocytosis 11/23/2020   Alcohol use disorder, severe, dependence (HCC) 10/08/2020   Hyponatremia 09/17/2020   Alcohol withdrawal (HCC) 09/12/2020   Moderate protein malnutrition (HCC) 08/20/2020   Elevated SGOT (AST) 08/19/2020   Elevated troponin 08/19/2020   Transaminitis 08/19/2020   Alcohol abuse 06/21/2020   Acute respiratory failure with hypoxia (HCC) 09/19/2018   Large bowel obstruction (HCC)    Small bowel obstruction (HCC) 07/20/2018   Hypomagnesemia 07/20/2018   Generalized weakness 03/14/2018   Polypharmacy 02/23/2017   Hypokalemia 02/23/2017   Acute metabolic encephalopathy 02/22/2017   Anxiety 02/22/2017   Altered mental  status    Pain medication agreement signed 10/10/2016   Opioid dependence (HCC) 10/10/2016   Status post colostomy takedown 04/12/2016   Chronic constipation 12/03/2015   Peritonitis with abscess of intestine (HCC) 11/29/2015   COPD exacerbation (HCC) 09/20/2015   Osteopenia 06/10/2014   Vitamin D deficiency 06/10/2014   Hypothyroidism    Depression 11/18/2012   Insomnia 11/18/2012   Chronic pain syndrome 11/18/2012   HLD (hyperlipidemia) 09/01/2012   S/P colostomy (HCC) 04/19/2012   Normocytic anemia 10/15/2011   Hypertension 10/13/2011   Chronic back pain 10/13/2011   COPD (chronic obstructive pulmonary disease) (HCC) 10/09/2011    Orientation RESPIRATION BLADDER Height & Weight     Self, Time, Situation, Place  Normal Continent Weight: 130 lb 8.2 oz (59.2 kg) Height:  5\' 1"  (154.9 cm)  BEHAVIORAL SYMPTOMS/MOOD NEUROLOGICAL BOWEL NUTRITION STATUS      Continent Diet (Regular)  AMBULATORY STATUS COMMUNICATION OF NEEDS Skin   Extensive Assist Verbally Normal                       Personal Care Assistance Level of Assistance  Bathing, Feeding, Dressing Bathing Assistance: Limited assistance Feeding assistance: Independent Dressing Assistance: Limited assistance     Functional Limitations Info  Sight, Hearing, Speech Sight Info: Adequate Hearing Info: Adequate Speech Info: Adequate    SPECIAL CARE FACTORS FREQUENCY  PT (By licensed PT), OT (By licensed OT)     PT Frequency: 5 times weekly OT Frequency: 5 times weekly            Contractures Contractures Info: Not present    Additional Factors Info  Code  Status, Allergies Code Status Info: FULL Allergies Info: Librium (Chlordiazepoxide), Toradol (Ketorolac Tromethamine), Butalbital-apap-caffeine, Demerol, Esgic (Butalbital-apap-caffeine), Iodine, Limonene, Meperidine, Pyridium (Phenazopyridine)           Current Medications (05/11/2023):  This is the current hospital active medication list Current  Facility-Administered Medications  Medication Dose Route Frequency Provider Last Rate Last Admin   acetaminophen (TYLENOL) tablet 650 mg  650 mg Oral Q6H PRN Zierle-Ghosh, Asia B, DO       Or   acetaminophen (TYLENOL) suppository 650 mg  650 mg Rectal Q6H PRN Zierle-Ghosh, Asia B, DO       folic acid (FOLVITE) tablet 1 mg  1 mg Oral Daily Zierle-Ghosh, Asia B, DO   1 mg at 05/11/23 0918   heparin injection 5,000 Units  5,000 Units Subcutaneous Q8H Zierle-Ghosh, Asia B, DO   5,000 Units at 05/11/23 1431   hydrOXYzine (ATARAX) tablet 25 mg  25 mg Oral Q4H PRN Zierle-Ghosh, Asia B, DO       levothyroxine (SYNTHROID) tablet 50 mcg  50 mcg Oral Q0600 Zierle-Ghosh, Asia B, DO   50 mcg at 05/11/23 0518   LORazepam (ATIVAN) tablet 1-4 mg  1-4 mg Oral Q1H PRN Zierle-Ghosh, Asia B, DO   1 mg at 05/11/23 1429   Or   LORazepam (ATIVAN) injection 1-4 mg  1-4 mg Intravenous Q1H PRN Zierle-Ghosh, Asia B, DO       multivitamin with minerals tablet 1 tablet  1 tablet Oral Daily Zierle-Ghosh, Asia B, DO   1 tablet at 05/11/23 0918   ondansetron (ZOFRAN) tablet 4 mg  4 mg Oral Q6H PRN Zierle-Ghosh, Asia B, DO   4 mg at 05/11/23 4782   Or   ondansetron (ZOFRAN) injection 4 mg  4 mg Intravenous Q6H PRN Zierle-Ghosh, Asia B, DO   4 mg at 05/11/23 0042   oxyCODONE (Oxy IR/ROXICODONE) immediate release tablet 5 mg  5 mg Oral Q4H PRN Zierle-Ghosh, Asia B, DO   5 mg at 05/11/23 1429   thiamine (VITAMIN B1) tablet 100 mg  100 mg Oral Daily Zierle-Ghosh, Asia B, DO   100 mg at 05/11/23 0919   Or   thiamine (VITAMIN B1) injection 100 mg  100 mg Intravenous Daily Zierle-Ghosh, Asia B, DO         Discharge Medications: Please see discharge summary for a list of discharge medications.  Relevant Imaging Results:  Relevant Lab Results:   Additional Information SSN: [id number] [place]

## 2023-05-11 NOTE — Plan of Care (Signed)

## 2023-05-11 NOTE — H&P (Addendum)
History and Physical    Patient: Stacy Dalton DOB: 1950-08-23 DOA: 05/10/2023 DOS: the patient was seen and examined on 05/11/2023 PCP: Pcp, No  Patient coming from: Home  Chief Complaint:  Chief Complaint  Patient presents with   Alcohol Intoxication   Abdominal Pain   HPI: Stacy Dalton is a 72 y.o. female with medical history significant of COPD, alcohol abuse, GERD, hypothyroid disease, hypertension, chronic pain, and more presents the ED with a chief complaint of altered mental status.  EMS reports that the call was for shortness of breath, but when they got there patient was sitting on the porch with no pants on, covered with a blanket, yelling for her boyfriend.  Patient has no recollection of this.  The last thing she remembers is sitting on her couch talking on her cell phone.  Patient does not know what time the day that was.  Patient reports that she did not drink as much yesterday as she normally does.  She then says she is not sure how much she drank.  Regularly, she drinks most of a bottle of whiskey, and her boyfriend share some of it per her report.  Patient reports that she has been feeling generalized weakness over the last couple weeks.  She reports that she cannot take care of herself at home anymore and she needs to go to rehab or permanently to SNF.  Patient reports that while she does live with her boyfriend, he can barely stand up himself, so they cannot take care of each other.  Patient denies any current infectious symptoms.  She reports that she had diarrhea some days ago but is not exactly sure when.  She reports no abdominal pain, no vomiting.  She does report dysuria and her UA is pending.  She has chronic back pain has been going on for decades.  She denies any rashes or sores on her body.  Patient does report that she has not eaten much in for 5 days.  This likely accounts for her electrolyte derangements.  Patient has no other complaints at this  time.  Patient does not smoke.  She is full code.  Patient does have a history of alcohol use disorder.  She reports she has had withdrawals in the past.  She has never had a withdrawal seizure.  She reports that she has become generally weak, confused, and shaky when she has had withdrawal before.  Alcohol level 219 in the ED. Review of Systems: As mentioned in the history of present illness. All other systems reviewed and are negative. Past Medical History:  Diagnosis Date   Chronic pain    COPD (chronic obstructive pulmonary disease) (HCC)    told has copd, no current inhaler use   Cough    Depression    ETOH abuse    GERD (gastroesophageal reflux disease)    Hypertension    Hypothyroidism    Insomnia    Migraine    Migraine    Osteopenia    Pain management    Panic attacks    Small bowel obstruction [facility])    Past Surgical History:  Procedure Laterality Date   ABDOMINAL HYSTERECTOMY     BIOPSY  08/29/2021   Procedure: BIOPSY;  Surgeon: Lanelle Bal, DO;  Location: [facility];  Service: Endoscopy;;   COLON SURGERY     COLOSTOMY CLOSURE  04/19/2012   Procedure: COLOSTOMY CLOSURE;  Surgeon: Ernestene Mention, MD;  Location: [facility];  Service:  General;  Laterality: N/A;  Laparotomy, Resection and Closure of Colostomy   COLOSTOMY TAKEDOWN N/A 04/12/2016   Procedure: Luz Brazen TAKEDOWN;  Surgeon: Luretha Murphy, MD;  Location: [facility];  Service: General;  Laterality: N/A;   ESOPHAGOGASTRODUODENOSCOPY (EGD) WITH PROPOFOL N/A 08/29/2021   Procedure: ESOPHAGOGASTRODUODENOSCOPY (EGD) WITH PROPOFOL;  Surgeon: Lanelle Bal, DO;  [facility]: [facility];  Service: Endoscopy;  Laterality: N/A;   INCONTINENCE SURGERY     LAPAROTOMY  10/04/2011, colostomy also   Procedure: EXPLORATORY LAPAROTOMY;  Surgeon: Ernestene Mention, MD;  Location: [facility];  Service: General;  Laterality: N/A;  left partial colectomy with colostomy   LAPAROTOMY  04/19/2012   Procedure: EXPLORATORY LAPAROTOMY;   Surgeon: Ernestene Mention, MD;  Location: [facility];  Service: General;  Laterality: N/A;   LAPAROTOMY N/A 11/29/2015   Procedure: EXPLORATORY LAPAROTOMY; SUBTOTAL COLECTOMY WITH HARTMAN PROCEDURE AND END COLOSTOMY;  Surgeon: Luretha Murphy, MD;  Location: [facility];  Service: General;  Laterality: N/A;   TUBAL LIGATION     VENTRAL HERNIA REPAIR  04/19/2012   Procedure: HERNIA REPAIR VENTRAL ADULT;  Surgeon: Ernestene Mention, MD;  Location: [facility];  Service: General;  Laterality: N/A;   Social History:  reports that she quit smoking about 24 years ago. Her smoking use included cigarettes. She started smoking about 44 years ago. She has a 30 pack-year smoking history. She has never used smokeless tobacco. She reports current alcohol use of about 3.0 standard drinks of alcohol per week. She reports that she does not use drugs.  Allergies  Allergen Reactions   Librium [Chlordiazepoxide]     Became unresponsive   Toradol [Ketorolac Tromethamine] Shortness Of Breath   Butalbital-Apap-Caffeine Other (See Comments)    jittery   Demerol Swelling   Esgic [Butalbital-Apap-Caffeine] Other (See Comments)    jittery   Iodine Other (See Comments)    bp bottomed out per pt several hours later; unsure if pre medicated in past with cm; [place] [facility]   Limonene     Other reaction(s): Other (See Comments) jittery   Meperidine Swelling   Pyridium [Phenazopyridine] Nausea Only    Family History  Problem Relation Age of Onset   Heart disease Mother    Hypertension Mother    Cancer Sister        sinus   Diabetes Brother    Heart disease Brother    Thyroid disease Brother    Cancer Sister        abdominal ?   Thyroid disease Sister    Cancer Other        GE junction adenocarcinoma   Emphysema Father    Stroke Father    Hypertension Father    Heart disease Father    COPD Sister    Thyroid disease Sister    Thyroid disease Sister    Diabetes Brother    Thyroid disease Brother    Thyroid disease  Brother    Hypertension Brother    Post-traumatic stress disorder Brother    COPD Brother    Thyroid disease Brother    Thyroid disease Brother    Hypertension Brother    Transient ischemic attack Brother     Prior to Admission medications   Medication Sig Start Date End Date Taking? Authorizing Provider  albuterol (VENTOLIN HFA) 108 (90 Base) MCG/ACT inhaler Inhale 2 puffs into the lungs every 4 (four) hours as needed for wheezing or shortness of breath. 03/28/22   Shon Hale, MD  amLODipine (  NORVASC) 10 MG tablet Take 1 tablet (10 mg total) by mouth daily. 03/28/22   Shon Hale, MD  hydrOXYzine (VISTARIL) 25 MG capsule Take 1 capsule (25 mg total) by mouth every 4 (four) hours as needed for anxiety or nausea. 03/28/22   Shon Hale, MD  levothyroxine (SYNTHROID) 50 MCG tablet Take 1 tablet (50 mcg total) by mouth daily before breakfast. 03/28/22   Mariea Clonts, Courage, MD  losartan (COZAAR) 50 MG tablet Take 1 tablet (50 mg total) by mouth daily. Patient not taking: Reported on 04/06/2022 03/28/22   Shon Hale, MD  metoprolol tartrate (LOPRESSOR) 25 MG tablet Take 1 tablet (25 mg total) by mouth 2 (two) times daily. Patient not taking: Reported on 04/06/2022 03/28/22   Shon Hale, MD    Physical Exam: Vitals:   05/10/23 2145 05/10/23 2215 05/10/23 2315 05/11/23 0004  BP:  137/73 129/62 139/71  Pulse:  94 93 89  Resp:  16 18 18   Temp: 98.3 F (36.8 C)   98.4 F (36.9 C)  TempSrc: Oral   Oral  SpO2:  94% 94% 95%  Weight:      Height:       1.  General: Patient lying supine in bed,  no acute distress   2. Psychiatric: Alert and oriented x 3, mood and behavior normal for situation, pleasant and cooperative with exam   3. Neurologic: Speech and language are normal, face is symmetric, moves all 4 extremities voluntarily, however bilateral lower extremities are subtly weak   4. HEENMT:  Head is atraumatic, normocephalic, pupils reactive to light, neck is  supple, trachea is midline, mucous membranes are moist   5. Respiratory : Lungs are clear to auscultation bilaterally without wheezing, rhonchi, rales, no cyanosis, no increase in work of breathing or accessory muscle use   6. Cardiovascular : Heart rate normal, rhythm is regular, murmur present, no rubs or gallops, no peripheral edema, peripheral pulses palpated   7. Gastrointestinal:  Abdomen is soft, nondistended, nontender to palpation bowel sounds active, no masses or organomegaly palpated   8. Skin:  Skin is warm, dry and intact without rashes, acute lesions, or ulcers on limited exam   9.Musculoskeletal:  No acute deformities or trauma, no asymmetry in tone, no peripheral edema, peripheral pulses palpated, no tenderness to palpation in the extremities  Data Reviewed: In the ED Afebrile, normal heart rate, normal respiratory rate, soft blood pressures Electrolyte derangements with hypokalemia hypomagnesemia Troponin normal x 2 Alcohol level is 219 Chest x-ray shows left base atelectasis no active disease EKG shows a heart rate of 77, sinus rhythm, QTc 458 1 L LR given in the ED Mag 2 g and potassium 40 mEq given in ED Tramadol given for back pain Assessment and Plan: * Altered mental status - Related to alcohol use?? - Alcohol level 219 - History of hypothyroidism, check TSH - Alert and oriented at the time of my exam  Hypomagnesemia - Magnesium 1.4 - 2 g given in the ED - Trend in the a.m.  Generalized weakness - Related to RUE electrolyte derangement?? - Likely has thiamine deficiency given alcohol use - Continue thiamine - Consult PT - Monitor electrolytes and replace as indicated - Troponin 13, 13 - UA pending - Patient requesting placement saying that she cannot take care of herself anymore at home  Hypokalemia - Potassium 2.4 - Replaced in the ED and repeat was up to 3.0 - Continue potassium runs - Monitor on telemetry - Trend in the  a.m.  Hypothyroidism - Check TSH - Continue Synthroid  Hypertension - Soft blood pressure down to 99/55 - Holding Norvasc, losartan, metoprolol      Advance Care Planning:   Code Status: Full Code  Consults: None at this time  Family Communication: No family at bedside  Severity of Illness: The appropriate patient status for this patient is OBSERVATION. Observation status is judged to be reasonable and necessary in order to provide the required intensity of service to ensure the patient's safety. The patient's presenting symptoms, physical exam findings, and initial radiographic and laboratory data in the context of their medical condition is felt to place them at decreased risk for further clinical deterioration. Furthermore, it is anticipated that the patient will be medically stable for discharge from the hospital within 2 midnights of admission.   Author: Lilyan Gilford, DO 05/11/2023 3:27 AM  For on call review [email or web address].

## 2023-05-12 DIAGNOSIS — E876 Hypokalemia: Secondary | ICD-10-CM | POA: Diagnosis not present

## 2023-05-12 DIAGNOSIS — G9341 Metabolic encephalopathy: Secondary | ICD-10-CM | POA: Diagnosis not present

## 2023-05-12 DIAGNOSIS — J441 Chronic obstructive pulmonary disease with (acute) exacerbation: Secondary | ICD-10-CM | POA: Diagnosis not present

## 2023-05-12 LAB — CBC WITH DIFFERENTIAL/PLATELET
Abs Immature Granulocytes: 0.07 10*3/uL (ref 0.00–0.07)
Basophils Absolute: 0 10*3/uL (ref 0.0–0.1)
Basophils Relative: 0 %
Eosinophils Absolute: 0.4 10*3/uL (ref 0.0–0.5)
Eosinophils Relative: 5 %
HCT: 37.5 % (ref 36.0–46.0)
Hemoglobin: 11.2 g/dL — ABNORMAL LOW (ref 12.0–15.0)
Immature Granulocytes: 1 %
Lymphocytes Relative: 11 %
Lymphs Abs: 1.1 10*3/uL (ref 0.7–4.0)
MCH: 27.4 pg (ref 26.0–34.0)
MCHC: 29.9 g/dL — ABNORMAL LOW (ref 30.0–36.0)
MCV: 91.7 fL (ref 80.0–100.0)
Monocytes Absolute: 1 10*3/uL (ref 0.1–1.0)
Monocytes Relative: 10 %
Neutro Abs: 6.8 10*3/uL (ref 1.7–7.7)
Neutrophils Relative %: 73 %
Platelets: 246 10*3/uL (ref 150–400)
RBC: 4.09 MIL/uL (ref 3.87–5.11)
RDW: 15.7 % — ABNORMAL HIGH (ref 11.5–15.5)
WBC: 9.4 10*3/uL (ref 4.0–10.5)
nRBC: 0 % (ref 0.0–0.2)

## 2023-05-12 LAB — COMPREHENSIVE METABOLIC PANEL
ALT: 35 U/L (ref 0–44)
AST: 81 U/L — ABNORMAL HIGH (ref 15–41)
Albumin: 2.6 g/dL — ABNORMAL LOW (ref 3.5–5.0)
Alkaline Phosphatase: 108 U/L (ref 38–126)
Anion gap: 6 (ref 5–15)
BUN: 6 mg/dL — ABNORMAL LOW (ref 8–23)
CO2: 27 mmol/L (ref 22–32)
Calcium: 8 mg/dL — ABNORMAL LOW (ref 8.9–10.3)
Chloride: 101 mmol/L (ref 98–111)
Creatinine, Ser: 0.64 mg/dL (ref 0.44–1.00)
GFR, Estimated: >60 mL/min (ref >60)
Glucose, Bld: 145 mg/dL — ABNORMAL HIGH (ref 70–99)
Potassium: 3.3 mmol/L — ABNORMAL LOW (ref 3.5–5.1)
Sodium: 134 mmol/L — ABNORMAL LOW (ref 135–145)
Total Bilirubin: 2 mg/dL — ABNORMAL HIGH (ref <1.2)
Total Protein: 5.6 g/dL — ABNORMAL LOW (ref 6.5–8.1)

## 2023-05-12 LAB — MAGNESIUM: Magnesium: 1.5 mg/dL — ABNORMAL LOW (ref 1.7–2.4)

## 2023-05-12 MED ORDER — GUAIFENESIN ER 600 MG PO TB12
600.0000 mg | ORAL_TABLET | Freq: Two times a day (BID) | ORAL | Status: DC
  Administered 2023-05-12 – 2023-05-14 (×5): 600 mg via ORAL
  Filled 2023-05-12 (×5): qty 1

## 2023-05-12 MED ORDER — DEXTROMETHORPHAN POLISTIREX ER 30 MG/5ML PO SUER
30.0000 mg | Freq: Two times a day (BID) | ORAL | Status: AC
  Administered 2023-05-12 – 2023-05-13 (×4): 30 mg via ORAL
  Filled 2023-05-12 (×4): qty 5

## 2023-05-12 MED ORDER — AMLODIPINE BESYLATE 5 MG PO TABS
5.0000 mg | ORAL_TABLET | Freq: Every day | ORAL | Status: DC
  Administered 2023-05-12 – 2023-05-14 (×3): 5 mg via ORAL
  Filled 2023-05-12 (×3): qty 1

## 2023-05-12 MED ORDER — IPRATROPIUM-ALBUTEROL 0.5-2.5 (3) MG/3ML IN SOLN
RESPIRATORY_TRACT | Status: AC
  Filled 2023-05-12: qty 3

## 2023-05-12 MED ORDER — DOXYCYCLINE HYCLATE 100 MG PO TABS
100.0000 mg | ORAL_TABLET | Freq: Two times a day (BID) | ORAL | Status: DC
  Administered 2023-05-12 – 2023-05-14 (×5): 100 mg via ORAL
  Filled 2023-05-12 (×5): qty 1

## 2023-05-12 MED ORDER — METHYLPREDNISOLONE SODIUM SUCC 125 MG IJ SOLR
125.0000 mg | Freq: Once | INTRAMUSCULAR | Status: AC
Start: 2023-05-12 — End: 2023-05-12
  Administered 2023-05-12: 125 mg via INTRAVENOUS
  Filled 2023-05-12: qty 2

## 2023-05-12 MED ORDER — IPRATROPIUM-ALBUTEROL 0.5-2.5 (3) MG/3ML IN SOLN
3.0000 mL | Freq: Three times a day (TID) | RESPIRATORY_TRACT | Status: DC
  Administered 2023-05-13 – 2023-05-14 (×4): 3 mL via RESPIRATORY_TRACT
  Filled 2023-05-12 (×4): qty 3

## 2023-05-12 MED ORDER — POTASSIUM CHLORIDE CRYS ER 20 MEQ PO TBCR
40.0000 meq | EXTENDED_RELEASE_TABLET | Freq: Once | ORAL | Status: AC
  Administered 2023-05-12: 40 meq via ORAL
  Filled 2023-05-12: qty 2

## 2023-05-12 MED ORDER — IPRATROPIUM-ALBUTEROL 0.5-2.5 (3) MG/3ML IN SOLN
3.0000 mL | RESPIRATORY_TRACT | Status: DC
  Administered 2023-05-12 (×4): 3 mL via RESPIRATORY_TRACT
  Filled 2023-05-12 (×3): qty 3

## 2023-05-12 MED ORDER — MAGNESIUM SULFATE 4 GM/100ML IV SOLN
4.0000 g | Freq: Once | INTRAVENOUS | Status: AC
  Administered 2023-05-12: 4 g via INTRAVENOUS
  Filled 2023-05-12: qty 100

## 2023-05-12 NOTE — Plan of Care (Signed)
  Problem: Education: Goal: Knowledge of General Education information will improve Description: Including pain rating scale, medication(s)/side effects and non-pharmacologic comfort measures Outcome: Progressing   Problem: Health Behavior/Discharge Planning: Goal: Ability to manage health-related needs will improve Outcome: Progressing   Problem: Clinical Measurements: Goal: Will remain free from infection Outcome: Progressing Goal: Diagnostic test results will improve Outcome: Progressing   Problem: Nutrition: Goal: Adequate nutrition will be maintained Outcome: Progressing   Problem: Coping: Goal: Level of anxiety will decrease Outcome: Progressing   

## 2023-05-12 NOTE — Hospital Course (Signed)
72 y.o. female with medical history significant of COPD, alcohol abuse, GERD, hypothyroid disease, hypertension, chronic pain, and more presents the ED with a chief complaint of altered mental status.  EMS reports that the call was for shortness of breath, but when they got there patient was sitting on the porch with no pants on, covered with a blanket, yelling for her boyfriend.  Patient has no recollection of this.  The last thing she remembers is sitting on her couch talking on her cell phone.  Patient does not know what time the day that was.  Patient reports that she did not drink as much yesterday as she normally does.  She then says she is not sure how much she drank.  Regularly, she drinks most of a bottle of whiskey, and her boyfriend share some of it per her report.  Patient reports that she has been feeling generalized weakness over the last couple weeks.  She reports that she cannot take care of herself at home anymore and she needs to go to rehab or permanently to SNF.  Patient reports that while she does live with her boyfriend, he can barely stand up himself, so they cannot take care of each other.  Patient denies any current infectious symptoms.  She reports that she had diarrhea some days ago but is not exactly sure when.  She reports no abdominal pain, no vomiting.  She does report dysuria and her UA is pending.  She has chronic back pain has been going on for decades.  She denies any rashes or sores on her body.  Patient does report that she has not eaten much in for 5 days.  This likely accounts for her electrolyte derangements.  Patient has no other complaints at this time.   Patient does not smoke.  She is full code.  Patient does have a history of alcohol use disorder.  She reports she has had withdrawals in the past.  She has never had a withdrawal seizure.  She reports that she has become generally weak, confused, and shaky when she has had withdrawal before.  Alcohol level 219 in the  ED.

## 2023-05-12 NOTE — Progress Notes (Addendum)
PROGRESS NOTE   Stacy Dalton  NWG:[id number] DOB: [birth year] DOA: 05/10/2023 PCP: Pcp, No   Chief Complaint  Patient presents with   Alcohol Intoxication   Abdominal Pain   Level of care: Med-Surg  Brief Admission History:  72 y.o. female with medical history significant of COPD, alcohol abuse, GERD, hypothyroid disease, hypertension, chronic pain, and more presents the ED with a chief complaint of altered mental status.  EMS reports that the call was for shortness of breath, but when they got there patient was sitting on the porch with no pants on, covered with a blanket, yelling for her boyfriend.  Patient has no recollection of this.  The last thing she remembers is sitting on her couch talking on her cell phone.  Patient does not know what time the day that was.  Patient reports that she did not drink as much yesterday as she normally does.  She then says she is not sure how much she drank.  Regularly, she drinks most of a bottle of whiskey, and her boyfriend share some of it per her report.  Patient reports that she has been feeling generalized weakness over the last couple weeks.  She reports that she cannot take care of herself at home anymore and she needs to go to rehab or permanently to SNF.  Patient reports that while she does live with her boyfriend, he can barely stand up himself, so they cannot take care of each other.  Patient denies any current infectious symptoms.  She reports that she had diarrhea some days ago but is not exactly sure when.  She reports no abdominal pain, no vomiting.  She does report dysuria and her UA is pending.  She has chronic back pain has been going on for decades.  She denies any rashes or sores on her body.  Patient does report that she has not eaten much in for 5 days.  This likely accounts for her electrolyte derangements.  Patient has no other complaints at this time.   Patient does not smoke.  She is full code.  Patient does have a history of  alcohol use disorder.  She reports she has had withdrawals in the past.  She has never had a withdrawal seizure.  She reports that she has become generally weak, confused, and shaky when she has had withdrawal before.  Alcohol level 219 in the ED.   Assessment and Plan:  Altered metabolic encephalopathy - Related to alcohol intoxication - Alcohol level 219 - History of hypothyroidism, check TSH--6.964 - Alert and oriented at this time  Hypomagnesemia - Magnesium 1.5 - 2 g given in the ED, give additional 4 gm IV on 11/9 - recheck in AM   Chronic alcohol abuse - continue CIWA to monitor for s/s of acute alcohol withdrawal - reports intolerance to librium but may consider diazepam if felt needed  Generalized weakness - Related to chronic alcohol abuse - Likely has thiamine deficiency given alcohol use - Continue thiamine supplementation and folic acid - Consulted PT for assessment  - Monitor electrolytes and replace as indicated - Troponin 13, 13 - Patient requesting placement saying that she cannot take care of herself anymore at home, working on SNF placement   Mild COPD exacerbation  - Pt having chest cough, congestion, wheezing - IV solumedrol 125 mg  - schedule bronchodilators (Duoneb Q4h) - start mucinex BID x 3 d - delsym cough syrup BID  - doxycycline 100 mg BID x 3d  - pt  reports she is no longer smoking but did smoke for 30+ years  Hypokalemia - Potassium being repleted  - Recheck in AM   Hypothyroidism - Check TSH -- 6.964 - Continue Synthroid at same dose given poor compliance due to alcohol abuse  Hypertension - resumed home amlodipine 5 mg daily   DVT prophylaxis: [place] heparin Code Status: Full  Family Communication:  Disposition: planning DC to [facility] 11/11  Consultants:   Procedures:   Antimicrobials:   Doxycycline 11/9>>  Subjective: Pt having cough, SOB, chest wheezing, tightness  Objective: Vitals:   05/11/23 1916 05/12/23 0440  05/12/23 0843 05/12/23 0938  BP: 139/68 (!) 147/68  131/72  Pulse: 87 91  95  Resp: 20 20  16   Temp: 98.8 F (37.1 C) 98.5 F (36.9 C)    TempSrc: Oral Oral    SpO2: 92% 91% 96% 95%  Weight:      Height:        Intake/Output Summary (Last 24 hours) at 05/12/2023 1052 Last data filed at 05/12/2023 0555 Gross per 24 hour  Intake 240 ml  Output --  Net 240 ml   Filed Weights   05/10/23 1650 05/11/23 0505  Weight: 59 kg 59.2 kg   Examination:  General exam: Appears calm and comfortable  Respiratory system: tight with expiratory wheezing heard bilaterally. Respiratory effort normal. Cardiovascular system: normal S1 & S2 heard. No JVD, murmurs, rubs, gallops or clicks. No pedal edema. Gastrointestinal system: Abdomen is nondistended, soft and nontender. No organomegaly or masses felt. Normal bowel sounds heard. Central nervous system: Alert and oriented. No focal neurological deficits. Extremities: Symmetric 5 x 5 power. Skin: No rashes, lesions or ulcers. Psychiatry: Judgement and insight appear normal. Mood & affect appropriate.   Data Reviewed: I have personally reviewed following labs and imaging studies  CBC: Recent Labs  Lab 05/10/23 1811 05/11/23 0448 05/12/23 0501  WBC 10.7* 15.1* 9.4  NEUTROABS 7.5 11.5* 6.8  HGB 12.0 12.1 11.2*  HCT 38.6 38.3 37.5  MCV 88.9 88.7 91.7  PLT 352 392 246    Basic Metabolic Panel: Recent Labs  Lab 05/10/23 1811 05/10/23 2307 05/11/23 0448 05/12/23 0501  NA 134* 137 134* 134*  K 2.4* 3.0* 3.7 3.3*  CL 96* 99 101 101  CO2 21* 21* 22 27  GLUCOSE 104* 126* 128* 145*  BUN 6* 5* 6* 6*  CREATININE 0.64 0.58 0.73 0.64  CALCIUM 7.8* 7.8* 8.0* 8.0*  MG 1.4*  --  1.9 1.5*    CBG: No results for input(s): "GLUCAP" in the last 168 hours.  No results found for this or any previous visit (from the past 240 hour(s)).   Radiology Studies: DG CHEST PORT 1 VIEW  Result Date: 05/12/2023 CLINICAL DATA:  Dyspnea. EXAM: PORTABLE  CHEST 1 VIEW COMPARISON:  05/10/2023. FINDINGS: The heart size and mediastinal contours are stable. There is atherosclerotic calcification of the aorta. The pulmonary vasculature is mildly distended. Atelectasis or scarring is noted at the left lung base. No consolidation, effusion, or pneumothorax. No acute osseous abnormality. IMPRESSION: 1. Mild pulmonary vascular congestion. 2. Atelectasis or scarring at the left lung base. Electronically Signed   By: Thornell Sartorius M.D.   On: 05/12/2023 00:23   DG Chest Portable 1 View  Result Date: 05/10/2023 CLINICAL DATA:  Chest pain EXAM: PORTABLE CHEST 1 VIEW COMPARISON:  03/22/2023 FINDINGS: Linear density at the left base compatible with atelectasis. Right lung clear. Heart and mediastinal contours within normal limits. Aortic atherosclerosis. No  effusions or acute bony abnormality. IMPRESSION: Left base atelectasis.  No active disease. Electronically Signed   By: Charlett Nose M.D.   On: 05/10/2023 19:34    Scheduled Meds:  dextromethorphan  30 mg Oral BID   doxycycline  100 mg Oral Q12H   folic acid  1 mg Oral Daily   guaiFENesin  600 mg Oral BID   heparin  5,000 Units Subcutaneous Q8H   ipratropium-albuterol  3 mL Nebulization Q4H   levothyroxine  50 mcg Oral Q0600   multivitamin with minerals  1 tablet Oral Daily   thiamine  100 mg Oral Daily   Or   thiamine  100 mg Intravenous Daily   Continuous Infusions:  magnesium sulfate bolus IVPB 4 g (05/12/23 1009)    LOS: 1 day   Time spent: 54 mins  Keanu Lesniak Laural Benes, MD How to contact the [facility] Attending or Consulting provider 7A - 7P or covering provider during after hours 7P -7A, for this patient?  Check the care team in [facility] and look for a) attending/consulting [facility] provider listed and b) the [facility] team listed Log into [email or web address] to find provider on call.  Locate the [facility] provider you are looking for under [facility] and page to a number that you can be directly reached. If you still have  difficulty reaching the provider, please page the [facility] (Director on Call) for the Hospitalists listed on amion for assistance.  05/12/2023, 10:52 AM

## 2023-05-13 DIAGNOSIS — G9341 Metabolic encephalopathy: Secondary | ICD-10-CM | POA: Diagnosis not present

## 2023-05-13 DIAGNOSIS — E876 Hypokalemia: Secondary | ICD-10-CM | POA: Diagnosis not present

## 2023-05-13 DIAGNOSIS — J441 Chronic obstructive pulmonary disease with (acute) exacerbation: Secondary | ICD-10-CM | POA: Diagnosis not present

## 2023-05-13 LAB — BASIC METABOLIC PANEL
Anion gap: 5 (ref 5–15)
BUN: 5 mg/dL — ABNORMAL LOW (ref 8–23)
CO2: 25 mmol/L (ref 22–32)
Calcium: 8.4 mg/dL — ABNORMAL LOW (ref 8.9–10.3)
Chloride: 103 mmol/L (ref 98–111)
Creatinine, Ser: 0.66 mg/dL (ref 0.44–1.00)
GFR, Estimated: >60 mL/min (ref >60)
Glucose, Bld: 191 mg/dL — ABNORMAL HIGH (ref 70–99)
Potassium: 5.2 mmol/L — ABNORMAL HIGH (ref 3.5–5.1)
Sodium: 133 mmol/L — ABNORMAL LOW (ref 135–145)

## 2023-05-13 LAB — PHOSPHORUS: Phosphorus: <1 mg/dL — CL (ref 2.5–4.6)

## 2023-05-13 LAB — MAGNESIUM: Magnesium: 2 mg/dL (ref 1.7–2.4)

## 2023-05-13 MED ORDER — SODIUM ZIRCONIUM CYCLOSILICATE 10 G PO PACK
10.0000 g | PACK | Freq: Three times a day (TID) | ORAL | Status: AC
  Administered 2023-05-13 (×2): 10 g via ORAL
  Filled 2023-05-13 (×2): qty 1

## 2023-05-13 MED ORDER — SODIUM PHOSPHATES 45 MMOLE/15ML IV SOLN
30.0000 mmol | Freq: Once | INTRAVENOUS | Status: AC
  Administered 2023-05-13: 30 mmol via INTRAVENOUS
  Filled 2023-05-13: qty 10

## 2023-05-13 MED ORDER — METHYLPREDNISOLONE SODIUM SUCC 125 MG IJ SOLR
125.0000 mg | Freq: Once | INTRAMUSCULAR | Status: AC
  Administered 2023-05-13: 125 mg via INTRAVENOUS
  Filled 2023-05-13: qty 2

## 2023-05-13 NOTE — Progress Notes (Signed)
PROGRESS NOTE   Stacy Dalton  NWG:[id number] DOB: 12/19/1950 DOA: 05/10/2023 PCP: Pcp, No   Chief Complaint  Patient presents with   Alcohol Intoxication   Abdominal Pain   Level of care: Med-Surg  Brief Admission History:  72 y.o. female with medical history significant of COPD, alcohol abuse, GERD, hypothyroid disease, hypertension, chronic pain, and more presents the ED with a chief complaint of altered mental status.  EMS reports that the call was for shortness of breath, but when they got there patient was sitting on the porch with no pants on, covered with a blanket, yelling for her boyfriend.  Patient has no recollection of this.  The last thing she remembers is sitting on her couch talking on her cell phone.  Patient does not know what time the day that was.  Patient reports that she did not drink as much yesterday as she normally does.  She then says she is not sure how much she drank.  Regularly, she drinks most of a bottle of whiskey, and her boyfriend share some of it per her report.  Patient reports that she has been feeling generalized weakness over the last couple weeks.  She reports that she cannot take care of herself at home anymore and she needs to go to rehab or permanently to SNF.  Patient reports that while she does live with her boyfriend, he can barely stand up himself, so they cannot take care of each other.  Patient denies any current infectious symptoms.  She reports that she had diarrhea some days ago but is not exactly sure when.  She reports no abdominal pain, no vomiting.  She does report dysuria and her UA is pending.  She has chronic back pain has been going on for decades.  She denies any rashes or sores on her body.  Patient does report that she has not eaten much in for 5 days.  This likely accounts for her electrolyte derangements.  Patient has no other complaints at this time.   Patient does not smoke.  She is full code.  Patient does have a history of  alcohol use disorder.  She reports she has had withdrawals in the past.  She has never had a withdrawal seizure.  She reports that she has become generally weak, confused, and shaky when she has had withdrawal before.  Alcohol level 219 in the ED.   Assessment and Plan:  Altered metabolic encephalopathy - Related to alcohol intoxication - Alcohol level 219 - History of hypothyroidism, check TSH--6.964 - Alert and oriented at this time  Hypomagnesemia - Magnesium 1.5 - 2 g given in the ED, give additional 4 gm IV on 11/9 - recheck in AM   Severe hypophosphatemia  - IV replacement ordered, recheck in AM   Chronic alcohol abuse - continue CIWA to monitor for s/s of acute alcohol withdrawal - reports intolerance to librium but may consider diazepam if felt needed  Generalized weakness - Related to chronic alcohol abuse - Likely has thiamine deficiency given alcohol use - Continue thiamine supplementation and folic acid - Consulted PT for assessment  - Monitor electrolytes and replace as indicated - Troponin 13, 13 - Patient requesting placement saying that she cannot take care of herself anymore at home, working on SNF placement   Mild COPD exacerbation  - Pt having chest cough, congestion, wheezing - IV solumedrol 125 mg  - schedule bronchodilators (Duoneb Q4h) - started mucinex BID x 3 d - delsym cough syrup  BID  - doxycycline 100 mg BID x 3d  - pt reports she is no longer smoking but did smoke for 30+ years  Hypokalemia - Potassium being repleted  - Recheck in AM   Hypothyroidism - Check TSH -- 6.964 - Continue Synthroid at same dose given poor compliance due to alcohol abuse  Hypertension - resumed home amlodipine 5 mg daily   DVT prophylaxis: [place] heparin Code Status: Full  Family Communication:  Disposition: planning DC to [facility] 11/11 if medically stabilized   Consultants:   Procedures:   Antimicrobials:   Doxycycline 11/9>>  Subjective: Pt  still wheezing and coughing with chest tightness, also c/o chronic back pain.   Objective: Vitals:   05/13/23 0859 05/13/23 0948 05/13/23 1426 05/13/23 1441  BP:  (!) 152/76 128/67   Pulse:  88 83   Resp:  20 18   Temp:   98 F (36.7 C)   TempSrc:      SpO2: 92% 94% 94% 90%  Weight:      Height:        Intake/Output Summary (Last 24 hours) at 05/13/2023 1609 Last data filed at 05/13/2023 1500 Gross per 24 hour  Intake 760.06 ml  Output --  Net 760.06 ml   Filed Weights   05/10/23 1650 05/11/23 0505  Weight: 59 kg 59.2 kg   Examination:  General exam: Appears calm and comfortable  Respiratory system: diffuse expiratory wheezing heard bilaterally. Moderate increased work of breathing.  Cardiovascular system: normal S1 & S2 heard. No JVD, murmurs, rubs, gallops or clicks. No pedal edema. Gastrointestinal system: Abdomen is nondistended, soft and nontender. No organomegaly or masses felt. Normal bowel sounds heard. Central nervous system: Alert and oriented. No focal neurological deficits. Extremities: Symmetric 5 x 5 power. Skin: No rashes, lesions or ulcers. Psychiatry: Judgement and insight appear poor. Mood & affect appropriate.   Data Reviewed: I have personally reviewed following labs and imaging studies  CBC: Recent Labs  Lab 05/10/23 1811 05/11/23 0448 05/12/23 0501  WBC 10.7* 15.1* 9.4  NEUTROABS 7.5 11.5* 6.8  HGB 12.0 12.1 11.2*  HCT 38.6 38.3 37.5  MCV 88.9 88.7 91.7  PLT 352 392 246    Basic Metabolic Panel: Recent Labs  Lab 05/10/23 1811 05/10/23 2307 05/11/23 0448 05/12/23 0501 05/13/23 0403  NA 134* 137 134* 134* 133*  K 2.4* 3.0* 3.7 3.3* 5.2*  CL 96* 99 101 101 103  CO2 21* 21* 22 27 25   GLUCOSE 104* 126* 128* 145* 191*  BUN 6* 5* 6* 6* 5*  CREATININE 0.64 0.58 0.73 0.64 0.66  CALCIUM 7.8* 7.8* 8.0* 8.0* 8.4*  MG 1.4*  --  1.9 1.5* 2.0  PHOS  --   --   --   --  <1.0*    CBG: No results for input(s): "GLUCAP" in the last 168  hours.  No results found for this or any previous visit (from the past 240 hour(s)).   Radiology Studies: DG CHEST PORT 1 VIEW  Result Date: 05/12/2023 CLINICAL DATA:  Dyspnea. EXAM: PORTABLE CHEST 1 VIEW COMPARISON:  05/10/2023. FINDINGS: The heart size and mediastinal contours are stable. There is atherosclerotic calcification of the aorta. The pulmonary vasculature is mildly distended. Atelectasis or scarring is noted at the left lung base. No consolidation, effusion, or pneumothorax. No acute osseous abnormality. IMPRESSION: 1. Mild pulmonary vascular congestion. 2. Atelectasis or scarring at the left lung base. Electronically Signed   By: Thornell Sartorius M.D.   On: 05/12/2023  00:23    Scheduled Meds:  amLODipine  5 mg Oral Daily   dextromethorphan  30 mg Oral BID   doxycycline  100 mg Oral Q12H   folic acid  1 mg Oral Daily   guaiFENesin  600 mg Oral BID   heparin  5,000 Units Subcutaneous Q8H   ipratropium-albuterol  3 mL Nebulization TID   levothyroxine  50 mcg Oral Q0600   multivitamin with minerals  1 tablet Oral Daily   sodium zirconium cyclosilicate  10 g Oral TID   thiamine  100 mg Oral Daily   Or   thiamine  100 mg Intravenous Daily   Continuous Infusions:  sodium phosphate 30 mmol in dextrose 5 % 250 mL infusion 30 mmol (05/13/23 1118)    LOS: 2 days   Time spent: 61 mins  Marise Knapper, MD How to contact the [facility] Attending or Consulting provider 7A - 7P or covering provider during after hours 7P -7A, for this patient?  Check the care team in [facility] and look for a) attending/consulting TRH provider listed and b) the [facility] team listed Log into [email or web address] to find provider on call.  Locate the [facility] provider you are looking for under [facility] and page to a number that you can be directly reached. If you still have difficulty reaching the provider, please page the [facility] (Director on Call) for the Hospitalists listed on amion for assistance.  05/13/2023, 4:09 PM

## 2023-05-13 NOTE — Plan of Care (Signed)

## 2023-05-14 DIAGNOSIS — R410 Disorientation, unspecified: Secondary | ICD-10-CM | POA: Diagnosis not present

## 2023-05-14 DIAGNOSIS — R531 Weakness: Secondary | ICD-10-CM | POA: Diagnosis not present

## 2023-05-14 DIAGNOSIS — F10920 Alcohol use, unspecified with intoxication, uncomplicated: Secondary | ICD-10-CM | POA: Diagnosis not present

## 2023-05-14 LAB — BASIC METABOLIC PANEL
Anion gap: 7 (ref 5–15)
BUN: 11 mg/dL (ref 8–23)
CO2: 26 mmol/L (ref 22–32)
Calcium: 8.2 mg/dL — ABNORMAL LOW (ref 8.9–10.3)
Chloride: 103 mmol/L (ref 98–111)
Creatinine, Ser: 0.68 mg/dL (ref 0.44–1.00)
GFR, Estimated: >60 mL/min (ref >60)
Glucose, Bld: 192 mg/dL — ABNORMAL HIGH (ref 70–99)
Potassium: 5.1 mmol/L (ref 3.5–5.1)
Sodium: 136 mmol/L (ref 135–145)

## 2023-05-14 LAB — MAGNESIUM: Magnesium: 1.6 mg/dL — ABNORMAL LOW (ref 1.7–2.4)

## 2023-05-14 LAB — PHOSPHORUS: Phosphorus: 2.3 mg/dL — ABNORMAL LOW (ref 2.5–4.6)

## 2023-05-14 MED ORDER — MAGNESIUM SULFATE 4 GM/100ML IV SOLN
4.0000 g | Freq: Once | INTRAVENOUS | Status: AC
  Administered 2023-05-14: 4 g via INTRAVENOUS
  Filled 2023-05-14: qty 100

## 2023-05-14 MED ORDER — METOPROLOL TARTRATE 25 MG PO TABS: 12.5000 mg | ORAL_TABLET | Freq: Two times a day (BID) | ORAL | Status: AC

## 2023-05-14 MED ORDER — VITAMIN B-1 100 MG PO TABS: 100.0000 mg | ORAL_TABLET | Freq: Every day | ORAL | Status: AC

## 2023-05-14 MED ORDER — ADULT MULTIVITAMIN W/MINERALS CH: 1.0000 | ORAL_TABLET | Freq: Every day | ORAL | Status: AC

## 2023-05-14 MED ORDER — DOXYCYCLINE HYCLATE 100 MG PO TABS: 100.0000 mg | ORAL_TABLET | Freq: Two times a day (BID) | ORAL | Status: AC

## 2023-05-14 MED ORDER — ACETAMINOPHEN 325 MG PO TABS: 650.0000 mg | ORAL_TABLET | Freq: Four times a day (QID) | ORAL | Status: AC | PRN

## 2023-05-14 MED ORDER — LIDOCAINE 5 % EX PTCH: 1.0000 | MEDICATED_PATCH | CUTANEOUS | Status: DC

## 2023-05-14 MED ORDER — OXYCODONE HCL 5 MG PO TABS: 5.0000 mg | ORAL_TABLET | Freq: Four times a day (QID) | ORAL | 0 refills | Status: AC | PRN

## 2023-05-14 MED ORDER — FOLIC ACID 1 MG PO TABS: 1.0000 mg | ORAL_TABLET | Freq: Every day | ORAL | Status: AC

## 2023-05-14 MED ORDER — LIDOCAINE 5 % EX PTCH
1.0000 | MEDICATED_PATCH | CUTANEOUS | Status: DC
  Administered 2023-05-14: 1 via TRANSDERMAL
  Filled 2023-05-14: qty 1

## 2023-05-14 MED ORDER — GUAIFENESIN ER 600 MG PO TB12: 600.0000 mg | ORAL_TABLET | Freq: Two times a day (BID) | ORAL | Status: AC

## 2023-05-14 MED ORDER — IPRATROPIUM-ALBUTEROL 0.5-2.5 (3) MG/3ML IN SOLN: 3.0000 mL | Freq: Three times a day (TID) | RESPIRATORY_TRACT | Status: AC

## 2023-05-14 MED ORDER — LOSARTAN POTASSIUM 25 MG PO TABS: 25.0000 mg | ORAL_TABLET | Freq: Every day | ORAL | Status: DC

## 2023-05-14 NOTE — Plan of Care (Signed)

## 2023-05-14 NOTE — TOC Transition Note (Signed)
Transition of Care [facility]) - CM/SW Discharge Note   Patient Details  Name: Stacy Dalton MRN: [id number] Date of Birth: 09/06/1950  [facility] CM/SW Contact:  Villa Herb, LCSWA Phone Number: 05/14/2023, 11:32 AM  Clinical Narrative:    CSW updated that [facility] is ready to accept pt today. CSW provided RN with room and report numbers. [facility] to be called for transport when RN is ready. TOC signing off.   Final next level of care: Skilled Nursing Facility Barriers to Discharge: Barriers Resolved   Patient Goals and CMS Choice CMS Medicare.gov Compare Post Acute Care list provided to:: Patient Choice offered to / list presented to : Patient  Discharge Placement                  Patient to be transferred to facility by: [facility]   Patient and family notified of of transfer: 05/14/23  Discharge Plan and Services Additional resources added to the After Visit Summary for   In-house Referral: Clinical Social Work Discharge Planning Services: CM Consult Post Acute Care Choice: Skilled Nursing Facility                               Social Determinants of Health (SDOH) Interventions SDOH Screenings   Food Insecurity: No Food Insecurity (05/10/2023)  Housing: Low Risk  (05/10/2023)  Transportation Needs: No Transportation Needs (05/10/2023)  Utilities: Not At Risk (05/10/2023)  Alcohol Screen: High Risk (03/01/2021)  Depression (PHQ2-9): High Risk (06/01/2021)  Financial Resource Strain: Low Risk  (03/01/2021)  Physical Activity: Inactive (06/01/2021)  Social Connections: Socially Isolated (03/01/2021)  Stress: Stress Concern Present (06/01/2021)  Tobacco Use: Medium Risk (05/10/2023)     Readmission Risk Interventions    04/11/2022   11:48 AM 03/27/2022   10:41 AM 10/10/2021    9:08 AM  Readmission Risk Prevention Plan  Transportation Screening Complete Complete Complete  Medication Review (RN Care Manager) Complete Complete Complete  PCP  or Specialist appointment within 3-5 days of discharge Complete    HRI or Home Care Consult Complete Complete Complete  SW Recovery Care/Counseling Consult Complete Complete Complete  Palliative Care Screening Not Applicable Not Applicable Not Applicable  Skilled Nursing Facility Complete Not Applicable Not Applicable

## 2023-05-14 NOTE — Progress Notes (Signed)
Called report to Nurse Carolanne Grumbling at [facility]. Pt resting in room awaiting transport.

## 2023-05-14 NOTE — Care Management Important Message (Signed)
Important Message  Patient Details  Name: Stacy Dalton MRN: [id number] Date of Birth: 1951-05-01   Important Message Given:  Yes - Medicare IM     Sherilyn Banker 05/14/2023, 12:45 PM

## 2023-05-14 NOTE — Progress Notes (Signed)
Ng Discharge Note  Admit Date:  05/10/2023 Discharge date: 05/14/2023   Stacy Dalton to be D/C'd Skilled nursing facility per MD order.  AVS completed. Patient/caregiver able to verbalize understanding.  Discharge Medication: Allergies as of 05/14/2023       Reactions   Librium [chlordiazepoxide]    Became unresponsive   Toradol [ketorolac Tromethamine] Shortness Of Breath   Butalbital-apap-caffeine Other (See Comments)   jittery   Demerol Swelling   Esgic [butalbital-apap-caffeine] Other (See Comments)   jittery   Iodine Other (See Comments)   bp bottomed out per pt several hours later; unsure if pre medicated in past with cm; [place] [facility]   Limonene    Other reaction(s): Other (See Comments) jittery   Meperidine Swelling   Pyridium [phenazopyridine] Nausea Only        Medication List     STOP taking these medications    hydrOXYzine 25 MG capsule Commonly known as: VISTARIL       TAKE these medications    acetaminophen 325 MG tablet Commonly known as: TYLENOL Take 2 tablets (650 mg total) by mouth every 6 (six) hours as needed for mild pain (pain score 1-3), fever or headache (or Fever >/= 101).   albuterol 108 (90 Base) MCG/ACT inhaler Commonly known as: VENTOLIN HFA Inhale 2 puffs into the lungs every 4 (four) hours as needed for wheezing or shortness of breath.   amLODipine 10 MG tablet Commonly known as: NORVASC Take 1 tablet (10 mg total) by mouth daily.   doxycycline 100 MG tablet Commonly known as: VIBRA-TABS Take 1 tablet (100 mg total) by mouth every 12 (twelve) hours for 2 days.   folic acid 1 MG tablet Commonly known as: FOLVITE Take 1 tablet (1 mg total) by mouth daily. Start taking on: May 15, 2023   guaiFENesin 600 MG 12 hr tablet Commonly known as: MUCINEX Take 1 tablet (600 mg total) by mouth 2 (two) times daily for 2 days.   ipratropium-albuterol 0.5-2.5 (3) MG/3ML Soln Commonly known as: DUONEB Take 3 mLs by  nebulization 3 (three) times daily.   levothyroxine 50 MCG tablet Commonly known as: Synthroid Take 1 tablet (50 mcg total) by mouth daily before breakfast.   lidocaine 5 % Commonly known as: LIDODERM Place 1 patch onto the skin daily. Remove & Discard patch within 12 hours or as directed by MD Start taking on: May 15, 2023   losartan 25 MG tablet Commonly known as: COZAAR Take 1 tablet (25 mg total) by mouth daily. What changed:  medication strength how much to take   metoprolol tartrate 25 MG tablet Commonly known as: LOPRESSOR Take 0.5 tablets (12.5 mg total) by mouth 2 (two) times daily. What changed: how much to take   multivitamin with minerals Tabs tablet Take 1 tablet by mouth daily. Start taking on: May 15, 2023   oxyCODONE 5 MG immediate release tablet Commonly known as: Oxy IR/ROXICODONE Take 1 tablet (5 mg total) by mouth every 6 (six) hours as needed for severe pain (pain score 7-10).   thiamine 100 MG tablet Commonly known as: Vitamin B-1 Take 1 tablet (100 mg total) by mouth daily. Start taking on: May 15, 2023        Discharge Assessment: Vitals:   05/14/23 0500 05/14/23 0745  BP: (!) 162/93   Pulse: 83   Resp: 20   Temp: 98.2 F (36.8 C)   SpO2: 93% 94%   Skin clean, dry and intact without evidence  of skin break down, no evidence of skin tears noted. IV catheter discontinued intact. Site without signs and symptoms of complications - no redness or edema noted at insertion site, patient denies c/o pain - only slight tenderness at site.  Dressing with slight pressure applied.  D/c Instructions-Education: Discharge instructions given to patient/family with verbalized understanding. D/c education completed with patient/family including follow up instructions, medication list, d/c activities limitations if indicated, with other d/c instructions as indicated by MD - patient able to verbalize understanding, all questions fully  answered. Patient instructed to return to ED, call 911, or call MD for any changes in condition.  Patient escorted via WC, and DC to skilled nursing facility.  Cristal Ford, LPN 40/98/[id number] 47:82 PM

## 2023-05-14 NOTE — Discharge Instructions (Signed)
IMPORTANT INFORMATION: PAY CLOSE ATTENTION   PHYSICIAN DISCHARGE INSTRUCTIONS  Follow with Primary care provider  Pcp, No  and other consultants as instructed by your Hospitalist Physician  SEEK MEDICAL CARE OR RETURN TO EMERGENCY ROOM IF SYMPTOMS COME BACK, WORSEN OR NEW PROBLEM DEVELOPS   Please note: You were cared for by a hospitalist during your hospital stay. Every effort will be made to forward records to your primary care provider.  You can request that your primary care provider send for your hospital records if they have not received them.  Once you are discharged, your primary care physician will handle any further medical issues. Please note that NO REFILLS for any discharge medications will be authorized once you are discharged, as it is imperative that you return to your primary care physician (or establish a relationship with a primary care physician if you do not have one) for your post hospital discharge needs so that they can reassess your need for medications and monitor your lab values.  Please get a complete blood count and chemistry panel checked by your Primary MD at your next visit, and again as instructed by your Primary MD.  Get Medicines reviewed and adjusted: Please take all your medications with you for your next visit with your Primary MD  Laboratory/radiological data: Please request your Primary MD to go over all hospital tests and procedure/radiological results at the follow up, please ask your primary care provider to get all Hospital records sent to his/her office.  In some cases, they will be blood work, cultures and biopsy results pending at the time of your discharge. Please request that your primary care provider follow up on these results.  If you are diabetic, please bring your blood sugar readings with you to your follow up appointment with primary care.    Please call and make your follow up appointments as soon as possible.    Also Note the  following: If you experience worsening of your admission symptoms, develop shortness of breath, life threatening emergency, suicidal or homicidal thoughts you must seek medical attention immediately by calling 911 or calling your MD immediately  if symptoms less severe.  You must read complete instructions/literature along with all the possible adverse reactions/side effects for all the Medicines you take and that have been prescribed to you. Take any new Medicines after you have completely understood and accpet all the possible adverse reactions/side effects.   Do not drive when taking Pain medications or sleeping medications (Benzodiazepines)  Do not take more than prescribed Pain, Sleep and Anxiety Medications. It is not advisable to combine anxiety,sleep and pain medications without talking with your primary care practitioner  Special Instructions: If you have smoked or chewed Tobacco  in the last 2 yrs please stop smoking, stop any regular Alcohol  and or any Recreational drug use.  Wear Seat belts while driving.  Do not drive if taking any narcotic, mind altering or controlled substances or recreational drugs or alcohol.       

## 2023-05-14 NOTE — Progress Notes (Signed)
11/11 I spoke with son Elijah Birk) and he acknowledged the terms of the IMM Letter. Letter will be mailed to the patient's address on file.

## 2023-05-14 NOTE — Discharge Summary (Signed)
Physician Discharge Summary  Stacy Dalton ZOX:[id number] DOB: 1951/04/16 DOA: 05/10/2023  Admit date: 05/10/2023 Discharge date: 05/14/2023  Admitted From:  HOME  Disposition: SNF   Recommendations for Outpatient Follow-up:  Follow up with PCP in 2 weeks Avoid all alcoholic drinks Avoid all tobacco products Please obtain BMP, magnesium in one week Please monitor BP and titrate medication as needed   Discharge Condition: STABLE   CODE STATUS: FULL DIET: heart healthy low sodium    Brief Hospitalization Summary: Please see all hospital notes, images, labs for full details of the hospitalization. Admission provider  HPI:  72 y.o. female with medical history significant of COPD, alcohol abuse, GERD, hypothyroid disease, hypertension, chronic pain, and more presents the ED with a chief complaint of altered mental status.  EMS reports that the call was for shortness of breath, but when they got there patient was sitting on the porch with no pants on, covered with a blanket, yelling for her boyfriend.  Patient has no recollection of this.  The last thing she remembers is sitting on her couch talking on her cell phone.  Patient does not know what time the day that was.  Patient reports that she did not drink as much yesterday as she normally does.  She then says she is not sure how much she drank.  Regularly, she drinks most of a bottle of whiskey, and her boyfriend share some of it per her report.  Patient reports that she has been feeling generalized weakness over the last couple weeks.  She reports that she cannot take care of herself at home anymore and she needs to go to rehab or permanently to SNF.  Patient reports that while she does live with her boyfriend, he can barely stand up himself, so they cannot take care of each other.  Patient denies any current infectious symptoms.  She reports that she had diarrhea some days ago but is not exactly sure when.  She reports no abdominal pain, no  vomiting.  She does report dysuria and her UA is pending.  She has chronic back pain has been going on for decades.  She denies any rashes or sores on her body.  Patient does report that she has not eaten much in for 5 days.  This likely accounts for her electrolyte derangements.  Patient has no other complaints at this time.   Patient does not smoke.  She is full code.  Patient does have a history of alcohol use disorder.  She reports she has had withdrawals in the past.  She has never had a withdrawal seizure.  She reports that she has become generally weak, confused, and shaky when she has had withdrawal before.  Alcohol level 219 in the ED.  Hospital Course by problem list   Altered metabolic encephalopathy - Related to alcohol intoxication - Alcohol level 219 on admission  - History of hypothyroidism, check TSH--6.964 - Alert and oriented at this time   Hypomagnesemia - Magnesium 1.5 - 2 g given in the ED, give additional 4 gm IV on 11/9, 11/11 - recheck in 1 week recommended   Severe hypophosphatemia  - IV replacement ordered, REPLETED     Chronic alcohol abuse - placed on CIWA to monitor for s/s of acute alcohol withdrawal - pt strongly advised to stop all alcohol consumption and seek outpatient treatment   Generalized weakness - Related to chronic alcohol abuse - Likely has thiamine deficiency given alcohol use - Continue thiamine supplementation and folic  acid - Consulted PT for assessment -- SNF recommended - Monitor electrolytes and replace as indicated - Troponin 13, 13 - Patient requesting placement saying that she cannot take care of herself anymore at home, working on SNF placement    Mild COPD exacerbation  - Pt having chest cough, congestion, wheezing - IV solumedrol 125 mg x 2 doses given with good results - scheduled bronchodilators (Duoneb TID) - started mucinex BID x 3 d - doxycycline 100 mg BID x 3d  - pt reports she is no longer smoking but did smoke for  30+ years   Hypokalemia - Potassium being repleted  - REPLETED    Hypothyroidism - Check TSH -- 6.964 - Continue Synthroid at same dose given poor compliance due to alcohol abuse   Hypertension - resumed home amlodipine, losartan, metoprolol    Discharge Diagnoses:  Principal Problem:   Altered mental status Active Problems:   Hypertension   Hypothyroidism   Acute metabolic encephalopathy   Hypokalemia   Generalized weakness   Hypomagnesemia   Alcohol abuse   Alcoholic intoxication without complication (HCC)   Discharge Instructions:  Allergies as of 05/14/2023       Reactions   Librium [chlordiazepoxide]    Became unresponsive   Toradol [ketorolac Tromethamine] Shortness Of Breath   Butalbital-apap-caffeine Other (See Comments)   jittery   Demerol Swelling   Esgic [butalbital-apap-caffeine] Other (See Comments)   jittery   Iodine Other (See Comments)   bp bottomed out per pt several hours later; unsure if pre medicated in past with cm; [place] [facility]   Limonene    Other reaction(s): Other (See Comments) jittery   Meperidine Swelling   Pyridium [phenazopyridine] Nausea Only        Medication List     STOP taking these medications    hydrOXYzine 25 MG capsule Commonly known as: VISTARIL       TAKE these medications    acetaminophen 325 MG tablet Commonly known as: TYLENOL Take 2 tablets (650 mg total) by mouth every 6 (six) hours as needed for mild pain (pain score 1-3), fever or headache (or Fever >/= 101).   albuterol 108 (90 Base) MCG/ACT inhaler Commonly known as: VENTOLIN HFA Inhale 2 puffs into the lungs every 4 (four) hours as needed for wheezing or shortness of breath.   amLODipine 10 MG tablet Commonly known as: NORVASC Take 1 tablet (10 mg total) by mouth daily.   doxycycline 100 MG tablet Commonly known as: VIBRA-TABS Take 1 tablet (100 mg total) by mouth every 12 (twelve) hours for 2 days.   folic acid 1 MG tablet Commonly  known as: FOLVITE Take 1 tablet (1 mg total) by mouth daily. Start taking on: May 15, 2023   guaiFENesin 600 MG 12 hr tablet Commonly known as: MUCINEX Take 1 tablet (600 mg total) by mouth 2 (two) times daily for 2 days.   ipratropium-albuterol 0.5-2.5 (3) MG/3ML Soln Commonly known as: DUONEB Take 3 mLs by nebulization 3 (three) times daily.   levothyroxine 50 MCG tablet Commonly known as: Synthroid Take 1 tablet (50 mcg total) by mouth daily before breakfast.   lidocaine 5 % Commonly known as: LIDODERM Place 1 patch onto the skin daily. Remove & Discard patch within 12 hours or as directed by MD Start taking on: May 15, 2023   losartan 25 MG tablet Commonly known as: COZAAR Take 1 tablet (25 mg total) by mouth daily. What changed:  medication strength how much to  take   metoprolol tartrate 25 MG tablet Commonly known as: LOPRESSOR Take 0.5 tablets (12.5 mg total) by mouth 2 (two) times daily. What changed: how much to take   multivitamin with minerals Tabs tablet Take 1 tablet by mouth daily. Start taking on: May 15, 2023   oxyCODONE 5 MG immediate release tablet Commonly known as: Oxy IR/ROXICODONE Take 1 tablet (5 mg total) by mouth every 6 (six) hours as needed for severe pain (pain score 7-10).   thiamine 100 MG tablet Commonly known as: Vitamin B-1 Take 1 tablet (100 mg total) by mouth daily. Start taking on: May 15, 2023        Follow-up Information     primary care provider. Schedule an appointment as soon as possible for a visit in 2 week(s).   Why: Hospital Follow Up               Allergies  Allergen Reactions   Librium [Chlordiazepoxide]     Became unresponsive   Toradol [Ketorolac Tromethamine] Shortness Of Breath   Butalbital-Apap-Caffeine Other (See Comments)    jittery   Demerol Swelling   Esgic [Butalbital-Apap-Caffeine] Other (See Comments)    jittery   Iodine Other (See Comments)    bp bottomed out per pt  several hours later; unsure if pre medicated in past with cm; [place] [facility]   Limonene     Other reaction(s): Other (See Comments) jittery   Meperidine Swelling   Pyridium [Phenazopyridine] Nausea Only   Allergies as of 05/14/2023       Reactions   Librium [chlordiazepoxide]    Became unresponsive   Toradol [ketorolac Tromethamine] Shortness Of Breath   Butalbital-apap-caffeine Other (See Comments)   jittery   Demerol Swelling   Esgic [butalbital-apap-caffeine] Other (See Comments)   jittery   Iodine Other (See Comments)   bp bottomed out per pt several hours later; unsure if pre medicated in past with cm; [place] [facility]   Limonene    Other reaction(s): Other (See Comments) jittery   Meperidine Swelling   Pyridium [phenazopyridine] Nausea Only        Medication List     STOP taking these medications    hydrOXYzine 25 MG capsule Commonly known as: VISTARIL       TAKE these medications    acetaminophen 325 MG tablet Commonly known as: TYLENOL Take 2 tablets (650 mg total) by mouth every 6 (six) hours as needed for mild pain (pain score 1-3), fever or headache (or Fever >/= 101).   albuterol 108 (90 Base) MCG/ACT inhaler Commonly known as: VENTOLIN HFA Inhale 2 puffs into the lungs every 4 (four) hours as needed for wheezing or shortness of breath.   amLODipine 10 MG tablet Commonly known as: NORVASC Take 1 tablet (10 mg total) by mouth daily.   doxycycline 100 MG tablet Commonly known as: VIBRA-TABS Take 1 tablet (100 mg total) by mouth every 12 (twelve) hours for 2 days.   folic acid 1 MG tablet Commonly known as: FOLVITE Take 1 tablet (1 mg total) by mouth daily. Start taking on: May 15, 2023   guaiFENesin 600 MG 12 hr tablet Commonly known as: MUCINEX Take 1 tablet (600 mg total) by mouth 2 (two) times daily for 2 days.   ipratropium-albuterol 0.5-2.5 (3) MG/3ML Soln Commonly known as: DUONEB Take 3 mLs by nebulization 3 (three) times daily.    levothyroxine 50 MCG tablet Commonly known as: Synthroid Take 1 tablet (50 mcg total)  by mouth daily before breakfast.   lidocaine 5 % Commonly known as: LIDODERM Place 1 patch onto the skin daily. Remove & Discard patch within 12 hours or as directed by MD Start taking on: May 15, 2023   losartan 25 MG tablet Commonly known as: COZAAR Take 1 tablet (25 mg total) by mouth daily. What changed:  medication strength how much to take   metoprolol tartrate 25 MG tablet Commonly known as: LOPRESSOR Take 0.5 tablets (12.5 mg total) by mouth 2 (two) times daily. What changed: how much to take   multivitamin with minerals Tabs tablet Take 1 tablet by mouth daily. Start taking on: May 15, 2023   oxyCODONE 5 MG immediate release tablet Commonly known as: Oxy IR/ROXICODONE Take 1 tablet (5 mg total) by mouth every 6 (six) hours as needed for severe pain (pain score 7-10).   thiamine 100 MG tablet Commonly known as: Vitamin B-1 Take 1 tablet (100 mg total) by mouth daily. Start taking on: May 15, 2023        Procedures/Studies: [place] CHEST PORT 1 VIEW  Result Date: 05/12/2023 CLINICAL DATA:  Dyspnea. EXAM: PORTABLE CHEST 1 VIEW COMPARISON:  05/10/2023. FINDINGS: The heart size and mediastinal contours are stable. There is atherosclerotic calcification of the aorta. The pulmonary vasculature is mildly distended. Atelectasis or scarring is noted at the left lung base. No consolidation, effusion, or pneumothorax. No acute osseous abnormality. IMPRESSION: 1. Mild pulmonary vascular congestion. 2. Atelectasis or scarring at the left lung base. Electronically Signed   By: Thornell Sartorius M.D.   On: 05/12/2023 00:23   DG Chest Portable 1 View  Result Date: 05/10/2023 CLINICAL DATA:  Chest pain EXAM: PORTABLE CHEST 1 VIEW COMPARISON:  03/22/2023 FINDINGS: Linear density at the left base compatible with atelectasis. Right lung clear. Heart and mediastinal contours within normal  limits. Aortic atherosclerosis. No effusions or acute bony abnormality. IMPRESSION: Left base atelectasis.  No active disease. Electronically Signed   By: Charlett Nose M.D.   On: 05/10/2023 19:34     Subjective: Pt reports that she is breathing much better, chronic back pain persists.  No loss of bowel or bladder function.  Pt is agreeable to SNF rehab transfer.   Discharge Exam: Vitals:   05/14/23 0500 05/14/23 0745  BP: (!) 162/93   Pulse: 83   Resp: 20   Temp: 98.2 F (36.8 C)   SpO2: 93% 94%   Vitals:   05/13/23 2006 05/13/23 2028 05/14/23 0500 05/14/23 0745  BP:  (!) 142/80 (!) 162/93   Pulse:  98 83   Resp:  20 20   Temp:  98.2 F (36.8 C) 98.2 F (36.8 C)   TempSrc:  Oral Oral   SpO2: 90% 94% 93% 94%  Weight:      Height:       General exam: Appears calm and comfortable  Respiratory system: rare exp wheezing heard bilaterally. Bilateral breath sounds. NO increased work of breathing.  Cardiovascular system: normal S1 & S2 heard. No JVD, murmurs, rubs, gallops or clicks. No pedal edema. Gastrointestinal system: Abdomen is nondistended, soft and nontender. No organomegaly or masses felt. Normal bowel sounds heard. Central nervous system: Alert and oriented. No focal neurological deficits. Extremities: Symmetric 5 x 5 power. Skin: No rashes, lesions or ulcers. Psychiatry: Judgement and insight appear poor. Mood & affect appropriate.    The results of significant diagnostics from this hospitalization (including imaging, microbiology, ancillary and laboratory) are listed below for reference.  Microbiology: No results found for this or any previous visit (from the past 240 hour(s)).   Labs: BNP (last 3 results) No results for input(s): "BNP" in the last 8760 hours. Basic Metabolic Panel: Recent Labs  Lab 05/10/23 1811 05/10/23 2307 05/11/23 0448 05/12/23 0501 05/13/23 0403 05/14/23 0427  NA 134* 137 134* 134* 133* 136  K 2.4* 3.0* 3.7 3.3* 5.2* 5.1  CL 96*  99 101 101 103 103  CO2 21* 21* 22 27 25 26   GLUCOSE 104* 126* 128* 145* 191* 192*  BUN 6* 5* 6* 6* 5* 11  CREATININE 0.64 0.58 0.73 0.64 0.66 0.68  CALCIUM 7.8* 7.8* 8.0* 8.0* 8.4* 8.2*  MG 1.4*  --  1.9 1.5* 2.0 1.6*  PHOS  --   --   --   --  <1.0* 2.3*   Liver Function Tests: Recent Labs  Lab 05/10/23 1811 05/11/23 0448 05/12/23 0501  AST 76* 118* 81*  ALT 35 42 35  ALKPHOS 113 119 108  BILITOT 1.2* 1.4* 2.0*  PROT 6.1* 6.0* 5.6*  ALBUMIN 2.8* 2.8* 2.6*   Recent Labs  Lab 05/10/23 1811  LIPASE 21   No results for input(s): "AMMONIA" in the last 168 hours. CBC: Recent Labs  Lab 05/10/23 1811 05/11/23 0448 05/12/23 0501  WBC 10.7* 15.1* 9.4  NEUTROABS 7.5 11.5* 6.8  HGB 12.0 12.1 11.2*  HCT 38.6 38.3 37.5  MCV 88.9 88.7 91.7  PLT 352 392 246   Cardiac Enzymes: No results for input(s): "CKTOTAL", "CKMB", "CKMBINDEX", "TROPONINI" in the last 168 hours. BNP: Invalid input(s): "POCBNP" CBG: No results for input(s): "GLUCAP" in the last 168 hours. D-Dimer No results for input(s): "DDIMER" in the last 72 hours. Hgb A1c No results for input(s): "HGBA1C" in the last 72 hours. Lipid Profile No results for input(s): "CHOL", "HDL", "LDLCALC", "TRIG", "CHOLHDL", "LDLDIRECT" in the last 72 hours. Thyroid function studies No results for input(s): "TSH", "T4TOTAL", "T3FREE", "THYROIDAB" in the last 72 hours.  Invalid input(s): "FREET3" Anemia work up No results for input(s): "VITAMINB12", "FOLATE", "FERRITIN", "TIBC", "IRON", "RETICCTPCT" in the last 72 hours. Urinalysis    Component Value Date/Time   COLORURINE YELLOW 03/24/2022 0957   APPEARANCEUR CLEAR 03/24/2022 0957   APPEARANCEUR Cloudy (A) 07/01/2021 1354   LABSPEC 1.013 03/24/2022 0957   PHURINE 5.0 03/24/2022 0957   GLUCOSEU NEGATIVE 03/24/2022 0957   HGBUR NEGATIVE 03/24/2022 0957   BILIRUBINUR NEGATIVE 03/24/2022 0957   BILIRUBINUR Negative 07/01/2021 1354   KETONESUR 5 (A) 03/24/2022 0957    PROTEINUR 30 (A) 03/24/2022 0957   UROBILINOGEN negative 07/01/2015 1541   UROBILINOGEN 0.2 10/07/2012 1928   NITRITE NEGATIVE 03/24/2022 0957   LEUKOCYTESUR SMALL (A) 03/24/2022 0957   Sepsis Labs Recent Labs  Lab 05/10/23 1811 05/11/23 0448 05/12/23 0501  WBC 10.7* 15.1* 9.4   Microbiology No results found for this or any previous visit (from the past 240 hour(s)).  Time coordinating discharge: 46 mins  SIGNED:  Standley Dakins, MD  [facility] 05/14/2023, 11:05 AM How to contact the [facility] Attending or Consulting provider 7A - 7P or covering provider during after hours 7P -7A, for this patient?  Check the care team in [facility] and look for a) attending/consulting [facility] provider listed and b) the [facility] team listed Log into [email or web address] and use [place]'s universal password to access. If you do not have the password, please contact the hospital operator. Locate the [facility] provider you are looking for under [facility] and page to a number  that you can be directly reached. If you still have difficulty reaching the provider, please page the [facility] (Director on Call) for the Hospitalists listed on amion for assistance.

## 2023-05-16 DIAGNOSIS — M6281 Muscle weakness (generalized): Secondary | ICD-10-CM | POA: Diagnosis not present

## 2023-05-16 DIAGNOSIS — F1090 Alcohol use, unspecified, uncomplicated: Secondary | ICD-10-CM | POA: Diagnosis not present

## 2023-05-16 DIAGNOSIS — G894 Chronic pain syndrome: Secondary | ICD-10-CM | POA: Diagnosis not present

## 2023-05-16 DIAGNOSIS — E039 Hypothyroidism, unspecified: Secondary | ICD-10-CM | POA: Diagnosis not present

## 2023-05-16 DIAGNOSIS — I10 Essential (primary) hypertension: Secondary | ICD-10-CM | POA: Diagnosis not present

## 2023-05-16 DIAGNOSIS — J441 Chronic obstructive pulmonary disease with (acute) exacerbation: Secondary | ICD-10-CM | POA: Diagnosis not present

## 2023-05-16 DIAGNOSIS — I2510 Atherosclerotic heart disease of native coronary artery without angina pectoris: Secondary | ICD-10-CM | POA: Diagnosis not present

## 2023-05-18 DIAGNOSIS — I10 Essential (primary) hypertension: Secondary | ICD-10-CM | POA: Diagnosis not present

## 2023-05-18 DIAGNOSIS — F1090 Alcohol use, unspecified, uncomplicated: Secondary | ICD-10-CM | POA: Diagnosis not present

## 2023-05-18 DIAGNOSIS — M6281 Muscle weakness (generalized): Secondary | ICD-10-CM | POA: Diagnosis not present

## 2023-05-18 DIAGNOSIS — G894 Chronic pain syndrome: Secondary | ICD-10-CM | POA: Diagnosis not present

## 2023-05-18 DIAGNOSIS — E039 Hypothyroidism, unspecified: Secondary | ICD-10-CM | POA: Diagnosis not present

## 2023-05-18 DIAGNOSIS — I2510 Atherosclerotic heart disease of native coronary artery without angina pectoris: Secondary | ICD-10-CM | POA: Diagnosis not present

## 2023-05-18 DIAGNOSIS — J441 Chronic obstructive pulmonary disease with (acute) exacerbation: Secondary | ICD-10-CM | POA: Diagnosis not present

## 2023-05-21 DIAGNOSIS — F4323 Adjustment disorder with mixed anxiety and depressed mood: Secondary | ICD-10-CM | POA: Diagnosis not present

## 2023-05-21 DIAGNOSIS — F1090 Alcohol use, unspecified, uncomplicated: Secondary | ICD-10-CM | POA: Diagnosis not present

## 2023-05-21 DIAGNOSIS — J441 Chronic obstructive pulmonary disease with (acute) exacerbation: Secondary | ICD-10-CM | POA: Diagnosis not present

## 2023-05-21 DIAGNOSIS — M6281 Muscle weakness (generalized): Secondary | ICD-10-CM | POA: Diagnosis not present

## 2023-05-21 DIAGNOSIS — G894 Chronic pain syndrome: Secondary | ICD-10-CM | POA: Diagnosis not present

## 2023-05-21 DIAGNOSIS — I2510 Atherosclerotic heart disease of native coronary artery without angina pectoris: Secondary | ICD-10-CM | POA: Diagnosis not present

## 2023-05-21 DIAGNOSIS — E039 Hypothyroidism, unspecified: Secondary | ICD-10-CM | POA: Diagnosis not present

## 2023-05-21 DIAGNOSIS — I10 Essential (primary) hypertension: Secondary | ICD-10-CM | POA: Diagnosis not present

## 2023-05-23 DIAGNOSIS — J441 Chronic obstructive pulmonary disease with (acute) exacerbation: Secondary | ICD-10-CM | POA: Diagnosis not present

## 2023-05-23 DIAGNOSIS — M6281 Muscle weakness (generalized): Secondary | ICD-10-CM | POA: Diagnosis not present

## 2023-05-23 DIAGNOSIS — G894 Chronic pain syndrome: Secondary | ICD-10-CM | POA: Diagnosis not present

## 2023-05-23 DIAGNOSIS — F1090 Alcohol use, unspecified, uncomplicated: Secondary | ICD-10-CM | POA: Diagnosis not present

## 2023-05-23 DIAGNOSIS — I10 Essential (primary) hypertension: Secondary | ICD-10-CM | POA: Diagnosis not present

## 2023-05-23 DIAGNOSIS — I2510 Atherosclerotic heart disease of native coronary artery without angina pectoris: Secondary | ICD-10-CM | POA: Diagnosis not present

## 2023-05-23 DIAGNOSIS — E039 Hypothyroidism, unspecified: Secondary | ICD-10-CM | POA: Diagnosis not present

## 2023-05-24 DIAGNOSIS — K219 Gastro-esophageal reflux disease without esophagitis: Secondary | ICD-10-CM | POA: Diagnosis not present

## 2023-05-24 DIAGNOSIS — G894 Chronic pain syndrome: Secondary | ICD-10-CM | POA: Diagnosis not present

## 2023-05-24 DIAGNOSIS — I2510 Atherosclerotic heart disease of native coronary artery without angina pectoris: Secondary | ICD-10-CM | POA: Diagnosis not present

## 2023-05-24 DIAGNOSIS — F1090 Alcohol use, unspecified, uncomplicated: Secondary | ICD-10-CM | POA: Diagnosis not present

## 2023-05-24 DIAGNOSIS — E039 Hypothyroidism, unspecified: Secondary | ICD-10-CM | POA: Diagnosis not present

## 2023-05-24 DIAGNOSIS — M6281 Muscle weakness (generalized): Secondary | ICD-10-CM | POA: Diagnosis not present

## 2023-05-24 DIAGNOSIS — I10 Essential (primary) hypertension: Secondary | ICD-10-CM | POA: Diagnosis not present

## 2023-05-24 DIAGNOSIS — G4700 Insomnia, unspecified: Secondary | ICD-10-CM | POA: Diagnosis not present

## 2023-05-25 DIAGNOSIS — I2510 Atherosclerotic heart disease of native coronary artery without angina pectoris: Secondary | ICD-10-CM | POA: Diagnosis not present

## 2023-05-25 DIAGNOSIS — G894 Chronic pain syndrome: Secondary | ICD-10-CM | POA: Diagnosis not present

## 2023-05-25 DIAGNOSIS — E039 Hypothyroidism, unspecified: Secondary | ICD-10-CM | POA: Diagnosis not present

## 2023-05-25 DIAGNOSIS — F1090 Alcohol use, unspecified, uncomplicated: Secondary | ICD-10-CM | POA: Diagnosis not present

## 2023-05-25 DIAGNOSIS — M6281 Muscle weakness (generalized): Secondary | ICD-10-CM | POA: Diagnosis not present

## 2023-05-25 DIAGNOSIS — I10 Essential (primary) hypertension: Secondary | ICD-10-CM | POA: Diagnosis not present

## 2023-05-25 DIAGNOSIS — J441 Chronic obstructive pulmonary disease with (acute) exacerbation: Secondary | ICD-10-CM | POA: Diagnosis not present

## 2023-05-28 DIAGNOSIS — F411 Generalized anxiety disorder: Secondary | ICD-10-CM | POA: Diagnosis not present

## 2023-05-28 DIAGNOSIS — F1090 Alcohol use, unspecified, uncomplicated: Secondary | ICD-10-CM | POA: Diagnosis not present

## 2023-05-28 DIAGNOSIS — J441 Chronic obstructive pulmonary disease with (acute) exacerbation: Secondary | ICD-10-CM | POA: Diagnosis not present

## 2023-05-28 DIAGNOSIS — G894 Chronic pain syndrome: Secondary | ICD-10-CM | POA: Diagnosis not present

## 2023-05-28 DIAGNOSIS — I2510 Atherosclerotic heart disease of native coronary artery without angina pectoris: Secondary | ICD-10-CM | POA: Diagnosis not present

## 2023-05-28 DIAGNOSIS — F331 Major depressive disorder, recurrent, moderate: Secondary | ICD-10-CM | POA: Diagnosis not present

## 2023-05-28 DIAGNOSIS — E039 Hypothyroidism, unspecified: Secondary | ICD-10-CM | POA: Diagnosis not present

## 2023-05-28 DIAGNOSIS — M6281 Muscle weakness (generalized): Secondary | ICD-10-CM | POA: Diagnosis not present

## 2023-05-28 DIAGNOSIS — G4700 Insomnia, unspecified: Secondary | ICD-10-CM | POA: Diagnosis not present

## 2023-05-28 DIAGNOSIS — I10 Essential (primary) hypertension: Secondary | ICD-10-CM | POA: Diagnosis not present

## 2023-06-01 DIAGNOSIS — E039 Hypothyroidism, unspecified: Secondary | ICD-10-CM | POA: Diagnosis not present

## 2023-06-01 DIAGNOSIS — G894 Chronic pain syndrome: Secondary | ICD-10-CM | POA: Diagnosis not present

## 2023-06-01 DIAGNOSIS — I2510 Atherosclerotic heart disease of native coronary artery without angina pectoris: Secondary | ICD-10-CM | POA: Diagnosis not present

## 2023-06-01 DIAGNOSIS — J441 Chronic obstructive pulmonary disease with (acute) exacerbation: Secondary | ICD-10-CM | POA: Diagnosis not present

## 2023-06-01 DIAGNOSIS — F1090 Alcohol use, unspecified, uncomplicated: Secondary | ICD-10-CM | POA: Diagnosis not present

## 2023-06-01 DIAGNOSIS — I10 Essential (primary) hypertension: Secondary | ICD-10-CM | POA: Diagnosis not present

## 2023-06-01 DIAGNOSIS — M6281 Muscle weakness (generalized): Secondary | ICD-10-CM | POA: Diagnosis not present

## 2023-06-04 DIAGNOSIS — J441 Chronic obstructive pulmonary disease with (acute) exacerbation: Secondary | ICD-10-CM | POA: Diagnosis not present

## 2023-06-04 DIAGNOSIS — G894 Chronic pain syndrome: Secondary | ICD-10-CM | POA: Diagnosis not present

## 2023-06-04 DIAGNOSIS — I10 Essential (primary) hypertension: Secondary | ICD-10-CM | POA: Diagnosis not present

## 2023-06-04 DIAGNOSIS — F411 Generalized anxiety disorder: Secondary | ICD-10-CM | POA: Diagnosis not present

## 2023-06-04 DIAGNOSIS — M6281 Muscle weakness (generalized): Secondary | ICD-10-CM | POA: Diagnosis not present

## 2023-06-04 DIAGNOSIS — F1090 Alcohol use, unspecified, uncomplicated: Secondary | ICD-10-CM | POA: Diagnosis not present

## 2023-06-04 DIAGNOSIS — I2510 Atherosclerotic heart disease of native coronary artery without angina pectoris: Secondary | ICD-10-CM | POA: Diagnosis not present

## 2023-06-04 DIAGNOSIS — F331 Major depressive disorder, recurrent, moderate: Secondary | ICD-10-CM | POA: Diagnosis not present

## 2023-06-04 DIAGNOSIS — E039 Hypothyroidism, unspecified: Secondary | ICD-10-CM | POA: Diagnosis not present

## 2023-06-06 DIAGNOSIS — M6281 Muscle weakness (generalized): Secondary | ICD-10-CM | POA: Diagnosis not present

## 2023-06-06 DIAGNOSIS — J441 Chronic obstructive pulmonary disease with (acute) exacerbation: Secondary | ICD-10-CM | POA: Diagnosis not present

## 2023-06-06 DIAGNOSIS — I2510 Atherosclerotic heart disease of native coronary artery without angina pectoris: Secondary | ICD-10-CM | POA: Diagnosis not present

## 2023-06-06 DIAGNOSIS — I10 Essential (primary) hypertension: Secondary | ICD-10-CM | POA: Diagnosis not present

## 2023-06-06 DIAGNOSIS — F1090 Alcohol use, unspecified, uncomplicated: Secondary | ICD-10-CM | POA: Diagnosis not present

## 2023-06-06 DIAGNOSIS — G894 Chronic pain syndrome: Secondary | ICD-10-CM | POA: Diagnosis not present

## 2023-06-06 DIAGNOSIS — E039 Hypothyroidism, unspecified: Secondary | ICD-10-CM | POA: Diagnosis not present

## 2023-06-08 DIAGNOSIS — J441 Chronic obstructive pulmonary disease with (acute) exacerbation: Secondary | ICD-10-CM | POA: Diagnosis not present

## 2023-06-08 DIAGNOSIS — I10 Essential (primary) hypertension: Secondary | ICD-10-CM | POA: Diagnosis not present

## 2023-06-08 DIAGNOSIS — F1090 Alcohol use, unspecified, uncomplicated: Secondary | ICD-10-CM | POA: Diagnosis not present

## 2023-06-08 DIAGNOSIS — G894 Chronic pain syndrome: Secondary | ICD-10-CM | POA: Diagnosis not present

## 2023-06-08 DIAGNOSIS — I2510 Atherosclerotic heart disease of native coronary artery without angina pectoris: Secondary | ICD-10-CM | POA: Diagnosis not present

## 2023-06-08 DIAGNOSIS — E039 Hypothyroidism, unspecified: Secondary | ICD-10-CM | POA: Diagnosis not present

## 2023-06-08 DIAGNOSIS — M6281 Muscle weakness (generalized): Secondary | ICD-10-CM | POA: Diagnosis not present

## 2023-06-11 DIAGNOSIS — F411 Generalized anxiety disorder: Secondary | ICD-10-CM | POA: Diagnosis not present

## 2023-06-11 DIAGNOSIS — F331 Major depressive disorder, recurrent, moderate: Secondary | ICD-10-CM | POA: Diagnosis not present

## 2023-06-11 DIAGNOSIS — E039 Hypothyroidism, unspecified: Secondary | ICD-10-CM | POA: Diagnosis not present

## 2023-06-11 DIAGNOSIS — I2510 Atherosclerotic heart disease of native coronary artery without angina pectoris: Secondary | ICD-10-CM | POA: Diagnosis not present

## 2023-06-11 DIAGNOSIS — F1090 Alcohol use, unspecified, uncomplicated: Secondary | ICD-10-CM | POA: Diagnosis not present

## 2023-06-11 DIAGNOSIS — M6281 Muscle weakness (generalized): Secondary | ICD-10-CM | POA: Diagnosis not present

## 2023-06-11 DIAGNOSIS — I10 Essential (primary) hypertension: Secondary | ICD-10-CM | POA: Diagnosis not present

## 2023-06-11 DIAGNOSIS — G894 Chronic pain syndrome: Secondary | ICD-10-CM | POA: Diagnosis not present

## 2023-06-11 DIAGNOSIS — J441 Chronic obstructive pulmonary disease with (acute) exacerbation: Secondary | ICD-10-CM | POA: Diagnosis not present

## 2023-06-13 DIAGNOSIS — I2510 Atherosclerotic heart disease of native coronary artery without angina pectoris: Secondary | ICD-10-CM | POA: Diagnosis not present

## 2023-06-13 DIAGNOSIS — E039 Hypothyroidism, unspecified: Secondary | ICD-10-CM | POA: Diagnosis not present

## 2023-06-13 DIAGNOSIS — J441 Chronic obstructive pulmonary disease with (acute) exacerbation: Secondary | ICD-10-CM | POA: Diagnosis not present

## 2023-06-13 DIAGNOSIS — I10 Essential (primary) hypertension: Secondary | ICD-10-CM | POA: Diagnosis not present

## 2023-06-13 DIAGNOSIS — G894 Chronic pain syndrome: Secondary | ICD-10-CM | POA: Diagnosis not present

## 2023-06-13 DIAGNOSIS — M6281 Muscle weakness (generalized): Secondary | ICD-10-CM | POA: Diagnosis not present

## 2023-06-13 DIAGNOSIS — F1090 Alcohol use, unspecified, uncomplicated: Secondary | ICD-10-CM | POA: Diagnosis not present

## 2023-06-15 DIAGNOSIS — E039 Hypothyroidism, unspecified: Secondary | ICD-10-CM | POA: Diagnosis not present

## 2023-06-15 DIAGNOSIS — F1090 Alcohol use, unspecified, uncomplicated: Secondary | ICD-10-CM | POA: Diagnosis not present

## 2023-06-15 DIAGNOSIS — M6281 Muscle weakness (generalized): Secondary | ICD-10-CM | POA: Diagnosis not present

## 2023-06-15 DIAGNOSIS — G894 Chronic pain syndrome: Secondary | ICD-10-CM | POA: Diagnosis not present

## 2023-06-15 DIAGNOSIS — I10 Essential (primary) hypertension: Secondary | ICD-10-CM | POA: Diagnosis not present

## 2023-06-15 DIAGNOSIS — I2510 Atherosclerotic heart disease of native coronary artery without angina pectoris: Secondary | ICD-10-CM | POA: Diagnosis not present

## 2023-06-15 DIAGNOSIS — J441 Chronic obstructive pulmonary disease with (acute) exacerbation: Secondary | ICD-10-CM | POA: Diagnosis not present

## 2023-06-18 DIAGNOSIS — E039 Hypothyroidism, unspecified: Secondary | ICD-10-CM | POA: Diagnosis not present

## 2023-06-18 DIAGNOSIS — M6281 Muscle weakness (generalized): Secondary | ICD-10-CM | POA: Diagnosis not present

## 2023-06-18 DIAGNOSIS — G894 Chronic pain syndrome: Secondary | ICD-10-CM | POA: Diagnosis not present

## 2023-06-18 DIAGNOSIS — I10 Essential (primary) hypertension: Secondary | ICD-10-CM | POA: Diagnosis not present

## 2023-06-18 DIAGNOSIS — I2510 Atherosclerotic heart disease of native coronary artery without angina pectoris: Secondary | ICD-10-CM | POA: Diagnosis not present

## 2023-06-18 DIAGNOSIS — F331 Major depressive disorder, recurrent, moderate: Secondary | ICD-10-CM | POA: Diagnosis not present

## 2023-06-18 DIAGNOSIS — F411 Generalized anxiety disorder: Secondary | ICD-10-CM | POA: Diagnosis not present

## 2023-06-18 DIAGNOSIS — J441 Chronic obstructive pulmonary disease with (acute) exacerbation: Secondary | ICD-10-CM | POA: Diagnosis not present

## 2023-06-18 DIAGNOSIS — F1090 Alcohol use, unspecified, uncomplicated: Secondary | ICD-10-CM | POA: Diagnosis not present

## 2023-06-20 DIAGNOSIS — M25512 Pain in left shoulder: Secondary | ICD-10-CM | POA: Diagnosis not present

## 2023-06-20 DIAGNOSIS — M79602 Pain in left arm: Secondary | ICD-10-CM | POA: Diagnosis not present

## 2023-06-22 DIAGNOSIS — M25512 Pain in left shoulder: Secondary | ICD-10-CM | POA: Diagnosis not present

## 2023-06-22 DIAGNOSIS — M79602 Pain in left arm: Secondary | ICD-10-CM | POA: Diagnosis not present

## 2023-06-23 DIAGNOSIS — M79602 Pain in left arm: Secondary | ICD-10-CM | POA: Diagnosis not present

## 2023-06-23 DIAGNOSIS — M25512 Pain in left shoulder: Secondary | ICD-10-CM | POA: Diagnosis not present

## 2023-06-26 DIAGNOSIS — M25512 Pain in left shoulder: Secondary | ICD-10-CM | POA: Diagnosis not present

## 2023-06-26 DIAGNOSIS — M79602 Pain in left arm: Secondary | ICD-10-CM | POA: Diagnosis not present

## 2023-06-29 DIAGNOSIS — F5101 Primary insomnia: Secondary | ICD-10-CM | POA: Diagnosis not present

## 2023-07-02 DIAGNOSIS — F331 Major depressive disorder, recurrent, moderate: Secondary | ICD-10-CM | POA: Diagnosis not present

## 2023-07-02 DIAGNOSIS — F5101 Primary insomnia: Secondary | ICD-10-CM | POA: Diagnosis not present

## 2023-07-02 DIAGNOSIS — R300 Dysuria: Secondary | ICD-10-CM | POA: Diagnosis not present

## 2023-07-02 DIAGNOSIS — R350 Frequency of micturition: Secondary | ICD-10-CM | POA: Diagnosis not present

## 2023-07-02 DIAGNOSIS — F411 Generalized anxiety disorder: Secondary | ICD-10-CM | POA: Diagnosis not present

## 2023-07-04 DIAGNOSIS — G4700 Insomnia, unspecified: Secondary | ICD-10-CM | POA: Diagnosis not present

## 2023-07-04 DIAGNOSIS — R300 Dysuria: Secondary | ICD-10-CM | POA: Diagnosis not present

## 2023-07-04 DIAGNOSIS — R350 Frequency of micturition: Secondary | ICD-10-CM | POA: Diagnosis not present

## 2023-07-04 DIAGNOSIS — N390 Urinary tract infection, site not specified: Secondary | ICD-10-CM | POA: Diagnosis not present

## 2023-07-04 DIAGNOSIS — F32A Depression, unspecified: Secondary | ICD-10-CM | POA: Diagnosis not present

## 2023-07-06 DIAGNOSIS — N390 Urinary tract infection, site not specified: Secondary | ICD-10-CM | POA: Diagnosis not present

## 2023-07-06 DIAGNOSIS — R350 Frequency of micturition: Secondary | ICD-10-CM | POA: Diagnosis not present

## 2023-07-06 DIAGNOSIS — R300 Dysuria: Secondary | ICD-10-CM | POA: Diagnosis not present

## 2023-07-09 DIAGNOSIS — R350 Frequency of micturition: Secondary | ICD-10-CM | POA: Diagnosis not present

## 2023-07-09 DIAGNOSIS — R300 Dysuria: Secondary | ICD-10-CM | POA: Diagnosis not present

## 2023-07-09 DIAGNOSIS — N390 Urinary tract infection, site not specified: Secondary | ICD-10-CM | POA: Diagnosis not present

## 2023-07-11 DIAGNOSIS — R300 Dysuria: Secondary | ICD-10-CM | POA: Diagnosis not present

## 2023-07-11 DIAGNOSIS — N390 Urinary tract infection, site not specified: Secondary | ICD-10-CM | POA: Diagnosis not present

## 2023-07-11 DIAGNOSIS — R350 Frequency of micturition: Secondary | ICD-10-CM | POA: Diagnosis not present

## 2023-07-13 DIAGNOSIS — N390 Urinary tract infection, site not specified: Secondary | ICD-10-CM | POA: Diagnosis not present

## 2023-07-13 DIAGNOSIS — R300 Dysuria: Secondary | ICD-10-CM | POA: Diagnosis not present

## 2023-07-13 DIAGNOSIS — R350 Frequency of micturition: Secondary | ICD-10-CM | POA: Diagnosis not present

## 2023-07-20 DIAGNOSIS — R350 Frequency of micturition: Secondary | ICD-10-CM | POA: Diagnosis not present

## 2023-07-20 DIAGNOSIS — R300 Dysuria: Secondary | ICD-10-CM | POA: Diagnosis not present

## 2023-07-20 DIAGNOSIS — N390 Urinary tract infection, site not specified: Secondary | ICD-10-CM | POA: Diagnosis not present

## 2023-07-23 DIAGNOSIS — F331 Major depressive disorder, recurrent, moderate: Secondary | ICD-10-CM | POA: Diagnosis not present

## 2023-07-23 DIAGNOSIS — F411 Generalized anxiety disorder: Secondary | ICD-10-CM | POA: Diagnosis not present

## 2023-07-25 DIAGNOSIS — N390 Urinary tract infection, site not specified: Secondary | ICD-10-CM | POA: Diagnosis not present

## 2023-07-25 DIAGNOSIS — R350 Frequency of micturition: Secondary | ICD-10-CM | POA: Diagnosis not present

## 2023-07-25 DIAGNOSIS — R300 Dysuria: Secondary | ICD-10-CM | POA: Diagnosis not present

## 2023-07-30 DIAGNOSIS — R399 Unspecified symptoms and signs involving the genitourinary system: Secondary | ICD-10-CM | POA: Diagnosis not present

## 2023-07-30 DIAGNOSIS — F331 Major depressive disorder, recurrent, moderate: Secondary | ICD-10-CM | POA: Diagnosis not present

## 2023-07-30 DIAGNOSIS — F411 Generalized anxiety disorder: Secondary | ICD-10-CM | POA: Diagnosis not present

## 2023-07-30 DIAGNOSIS — N390 Urinary tract infection, site not specified: Secondary | ICD-10-CM | POA: Diagnosis not present

## 2023-08-01 DIAGNOSIS — G894 Chronic pain syndrome: Secondary | ICD-10-CM | POA: Diagnosis not present

## 2023-08-01 DIAGNOSIS — E039 Hypothyroidism, unspecified: Secondary | ICD-10-CM | POA: Diagnosis not present

## 2023-08-01 DIAGNOSIS — N390 Urinary tract infection, site not specified: Secondary | ICD-10-CM | POA: Diagnosis not present

## 2023-08-01 DIAGNOSIS — F1090 Alcohol use, unspecified, uncomplicated: Secondary | ICD-10-CM | POA: Diagnosis not present

## 2023-08-01 DIAGNOSIS — R399 Unspecified symptoms and signs involving the genitourinary system: Secondary | ICD-10-CM | POA: Diagnosis not present

## 2023-08-01 DIAGNOSIS — J449 Chronic obstructive pulmonary disease, unspecified: Secondary | ICD-10-CM | POA: Diagnosis not present

## 2023-08-01 DIAGNOSIS — M6281 Muscle weakness (generalized): Secondary | ICD-10-CM | POA: Diagnosis not present

## 2023-08-30 ENCOUNTER — Emergency Department: Payer: Medicare Other

## 2023-08-30 ENCOUNTER — Other Ambulatory Visit: Payer: Self-pay

## 2023-08-30 ENCOUNTER — Emergency Department
Admission: EM | Admit: 2023-08-30 | Discharge: 2023-08-30 | Disposition: A | Discharge status: Home / Self Care | Payer: Medicare Other | Attending: Emergency Medicine | Admitting: Emergency Medicine

## 2023-08-30 DIAGNOSIS — R1011 Right upper quadrant pain: Secondary | ICD-10-CM | POA: Diagnosis not present

## 2023-08-30 DIAGNOSIS — I10 Essential (primary) hypertension: Secondary | ICD-10-CM | POA: Diagnosis not present

## 2023-08-30 DIAGNOSIS — I959 Hypotension, unspecified: Secondary | ICD-10-CM | POA: Diagnosis not present

## 2023-08-30 DIAGNOSIS — K828 Other specified diseases of gallbladder: Secondary | ICD-10-CM | POA: Diagnosis not present

## 2023-08-30 DIAGNOSIS — R410 Disorientation, unspecified: Secondary | ICD-10-CM | POA: Diagnosis not present

## 2023-08-30 DIAGNOSIS — R4182 Altered mental status, unspecified: Secondary | ICD-10-CM | POA: Diagnosis not present

## 2023-08-30 DIAGNOSIS — K760 Fatty (change of) liver, not elsewhere classified: Secondary | ICD-10-CM | POA: Diagnosis not present

## 2023-08-30 DIAGNOSIS — J449 Chronic obstructive pulmonary disease, unspecified: Secondary | ICD-10-CM | POA: Diagnosis not present

## 2023-08-30 LAB — COMPREHENSIVE METABOLIC PANEL
ALT: 15 U/L (ref 0–44)
AST: 28 U/L (ref 15–41)
Albumin: 4.2 g/dL (ref 3.5–5.0)
Alkaline Phosphatase: 65 U/L (ref 38–126)
Anion gap: 18 — ABNORMAL HIGH (ref 5–15)
BUN: 17 mg/dL (ref 8–23)
CO2: 17 mmol/L — ABNORMAL LOW (ref 22–32)
Calcium: 8.5 mg/dL — ABNORMAL LOW (ref 8.9–10.3)
Chloride: 105 mmol/L (ref 98–111)
Creatinine, Ser: 1.19 mg/dL — ABNORMAL HIGH (ref 0.44–1.00)
GFR, Estimated: 48 mL/min — ABNORMAL LOW (ref >60)
Glucose, Bld: 78 mg/dL (ref 70–99)
Potassium: 2.9 mmol/L — ABNORMAL LOW (ref 3.5–5.1)
Sodium: 140 mmol/L (ref 135–145)
Total Bilirubin: 1.2 mg/dL (ref 0.0–1.2)
Total Protein: 7.4 g/dL (ref 6.5–8.1)

## 2023-08-30 LAB — URINALYSIS, W/ REFLEX TO CULTURE (INFECTION SUSPECTED)
Bacteria, UA: NONE SEEN
Bilirubin Urine: NEGATIVE
Glucose, UA: NEGATIVE mg/dL
Hgb urine dipstick: NEGATIVE
Ketones, ur: 20 mg/dL — AB
Leukocytes,Ua: NEGATIVE
Nitrite: NEGATIVE
Protein, ur: NEGATIVE mg/dL
Specific Gravity, Urine: 1.02 (ref 1.005–1.030)
pH: 5 (ref 5.0–8.0)

## 2023-08-30 LAB — CBC
HCT: 37.6 % (ref 36.0–46.0)
Hemoglobin: 12.2 g/dL (ref 12.0–15.0)
MCH: 24.4 pg — ABNORMAL LOW (ref 26.0–34.0)
MCHC: 32.4 g/dL (ref 30.0–36.0)
MCV: 75.2 fL — ABNORMAL LOW (ref 80.0–100.0)
Platelets: 300 10*3/uL (ref 150–400)
RBC: 5 MIL/uL (ref 3.87–5.11)
RDW: 21.2 % — ABNORMAL HIGH (ref 11.5–15.5)
WBC: 4.9 10*3/uL (ref 4.0–10.5)
nRBC: 0 % (ref 0.0–0.2)

## 2023-08-30 LAB — LIPASE, BLOOD: Lipase: 27 U/L (ref 11–51)

## 2023-08-30 LAB — CBG MONITORING, ED: Glucose-Capillary: 86 mg/dL (ref 70–99)

## 2023-08-30 MED ORDER — SODIUM CHLORIDE 0.9 % IV BOLUS
1000.0000 mL | Freq: Once | INTRAVENOUS | Status: AC
  Administered 2023-08-30: 1000 mL via INTRAVENOUS

## 2023-08-30 MED ORDER — POTASSIUM CHLORIDE 20 MEQ PO PACK
60.0000 meq | PACK | ORAL | Status: AC
  Administered 2023-08-30: 60 meq via ORAL
  Filled 2023-08-30: qty 3

## 2023-08-30 NOTE — ED Notes (Signed)
 Called Son Elijah Birk. Elijah Birk is calling his wife to pick up the pt.

## 2023-08-30 NOTE — Discharge Instructions (Signed)
 Your examination and tests in the emergency department were all okay.  Please follow-up with your doctor for continued evaluation of your symptoms.

## 2023-08-30 NOTE — ED Notes (Signed)
 Called lab at this time to add on missing lipase lab.

## 2023-08-30 NOTE — ED Notes (Signed)
 Per MD okay to give food. Pt given [place] tray and water.

## 2023-08-30 NOTE — ED Triage Notes (Signed)
 Pt in via [facility]. Pt coming from [facility]. Manager from facility called due to new confusion per manager. Pt has hx of chronic UTI's.   Hr-76 97% 103/75 BS-92 97.7

## 2023-08-30 NOTE — ED Provider Notes (Signed)
 [facility] Provider Note    Event Date/Time   First MD Initiated Contact with Patient 08/30/23 [phone]     (approximate)   History   Chief Complaint: Altered Mental Status   HPI  Stacy Dalton is a 73 y.o. female with a history of hypertension GERD COPD alcohol abuse who comes the ED from independent living due to confusion this morning.  Has a history of UTIs.  Currently patient states that she feels fine and does not know why she is here.  Denies any recent illness, denies pain.  Normal oral intake.  Took her dog for a walk yesterday.  No falls or trauma.         Physical Exam   Triage Vital Signs: ED Triage Vitals  Encounter Vitals Group     BP 08/30/23 0947 132/63     Systolic BP Percentile --      Diastolic BP Percentile --      Pulse Rate 08/30/23 0947 72     Resp 08/30/23 0947 15     Temp 08/30/23 0948 98.1 F (36.7 C)     Temp Source 08/30/23 0948 Oral     SpO2 08/30/23 0947 97 %     Weight 08/30/23 0945 153 lb 6.4 oz (69.6 kg)     Height 08/30/23 0945 5\' 1"  (1.549 m)     Head Circumference --      Peak Flow --      Pain Score 08/30/23 0943 7     Pain Loc --      Pain Education --      Exclude from Growth Chart --     Most recent vital signs: Vitals:   08/30/23 1230 08/30/23 1300  BP: (!) 129/49 (!) 119/55  Pulse: 67 70  Resp: 17 17  Temp:    SpO2: 95% 97%    General: Awake, no distress.  CV:  Good peripheral perfusion.  Regular rate rhythm. Loud systolic murmur Resp:  Normal effort.  Clear to auscultation bilaterally Abd:  No distention.  Soft with mild right upper quadrant tenderness as well as suprapubic tenderness Other:  No lower extremity edema, full range of motion all extremities   ED Results / Procedures / Treatments   Labs (all labs ordered are listed, but only abnormal results are displayed) Labs Reviewed  COMPREHENSIVE METABOLIC PANEL - Abnormal; Notable for the following components:      Result  Value   Potassium 2.9 (*)    CO2 17 (*)    Creatinine, Ser 1.19 (*)    Calcium 8.5 (*)    GFR, Estimated 48 (*)    Anion gap 18 (*)    All other components within normal limits  CBC - Abnormal; Notable for the following components:   MCV 75.2 (*)    MCH 24.4 (*)    RDW 21.2 (*)    All other components within normal limits  URINALYSIS, W/ REFLEX TO CULTURE (INFECTION SUSPECTED) - Abnormal; Notable for the following components:   Color, Urine YELLOW (*)    APPearance CLEAR (*)    Ketones, ur 20 (*)    All other components within normal limits  LIPASE, BLOOD  CBG MONITORING, ED     EKG Interpreted by me Sinus rhythm rate of 71.  Normal axis, normal intervals.  Poor R wave progression.  Normal ST segments and T waves.   RADIOLOGY Ultrasound right upper quadrant interpreted by me, unremarkable.  Radiology report reviewed  PROCEDURES:  Procedures   MEDICATIONS ORDERED IN ED: Medications  sodium chloride 0.9 % bolus 1,000 mL (0 mLs Intravenous Stopped 08/30/23 1218)  potassium chloride (KLOR-CON) packet 60 mEq (60 mEq Oral Given 08/30/23 1219)     IMPRESSION / MDM / ASSESSMENT AND PLAN / ED COURSE  I reviewed the triage vital signs and the nursing notes.  DDx: Electrolyte derangement, UTI, anemia, AKI, dehydration, cholecystitis  Patient's presentation is most consistent with acute presentation with potential threat to life or bodily function.  Patient presents with episode of confusion, resolved on arrival to the ED.  Denies any acute complaints, but does have some tenderness on exam over the gallbladder and the bladder.  Will check labs, right upper quadrant ultrasound.   ----------------------------------------- 1:59 PM on 08/30/2023 ----------------------------------------- Patient continues to feel well, states that she feels like she is at her baseline.  Eager to be discharged, stable for outpatient follow-up.      FINAL CLINICAL IMPRESSION(S) / ED  DIAGNOSES   Final diagnoses:  Altered mental status, unspecified altered mental status type     Rx / DC Orders   ED Discharge Orders     None        Note:  This document was prepared using Dragon voice recognition software and may include unintentional dictation errors.   Sharman Cheek, MD 08/30/23 1400

## 2023-09-03 ENCOUNTER — Ambulatory Visit: Payer: Self-pay | Admitting: Family Medicine

## 2023-09-03 DIAGNOSIS — E876 Hypokalemia: Secondary | ICD-10-CM | POA: Diagnosis not present

## 2023-09-03 DIAGNOSIS — R079 Chest pain, unspecified: Secondary | ICD-10-CM | POA: Diagnosis not present

## 2023-09-03 DIAGNOSIS — Z91199 Patient's noncompliance with other medical treatment and regimen due to unspecified reason: Secondary | ICD-10-CM | POA: Diagnosis not present

## 2023-09-03 DIAGNOSIS — R410 Disorientation, unspecified: Secondary | ICD-10-CM | POA: Diagnosis not present

## 2023-09-03 DIAGNOSIS — J449 Chronic obstructive pulmonary disease, unspecified: Secondary | ICD-10-CM | POA: Diagnosis not present

## 2023-09-03 DIAGNOSIS — I10 Essential (primary) hypertension: Secondary | ICD-10-CM | POA: Diagnosis not present

## 2023-09-03 DIAGNOSIS — M549 Dorsalgia, unspecified: Secondary | ICD-10-CM | POA: Diagnosis not present

## 2023-09-04 DIAGNOSIS — M545 Low back pain, unspecified: Secondary | ICD-10-CM | POA: Diagnosis not present

## 2023-09-04 DIAGNOSIS — N390 Urinary tract infection, site not specified: Secondary | ICD-10-CM | POA: Diagnosis not present

## 2023-09-05 ENCOUNTER — Encounter: Payer: Self-pay | Admitting: Urology

## 2023-09-05 ENCOUNTER — Ambulatory Visit: Payer: Self-pay | Admitting: Urology

## 2023-09-10 ENCOUNTER — Emergency Department
Admission: EM | Admit: 2023-09-10 | Discharge: 2023-09-10 | Disposition: A | Discharge status: Home / Self Care | Attending: Emergency Medicine | Admitting: Emergency Medicine

## 2023-09-10 ENCOUNTER — Other Ambulatory Visit: Payer: Self-pay

## 2023-09-10 ENCOUNTER — Emergency Department

## 2023-09-10 DIAGNOSIS — G8929 Other chronic pain: Secondary | ICD-10-CM | POA: Insufficient documentation

## 2023-09-10 DIAGNOSIS — J449 Chronic obstructive pulmonary disease, unspecified: Secondary | ICD-10-CM | POA: Insufficient documentation

## 2023-09-10 DIAGNOSIS — I10 Essential (primary) hypertension: Secondary | ICD-10-CM | POA: Insufficient documentation

## 2023-09-10 DIAGNOSIS — M549 Dorsalgia, unspecified: Secondary | ICD-10-CM | POA: Insufficient documentation

## 2023-09-10 DIAGNOSIS — R531 Weakness: Secondary | ICD-10-CM | POA: Diagnosis not present

## 2023-09-10 LAB — BASIC METABOLIC PANEL
Anion gap: 15 (ref 5–15)
BUN: 17 mg/dL (ref 8–23)
CO2: 18 mmol/L — ABNORMAL LOW (ref 22–32)
Calcium: 9.2 mg/dL (ref 8.9–10.3)
Chloride: 108 mmol/L (ref 98–111)
Creatinine, Ser: 0.92 mg/dL (ref 0.44–1.00)
GFR, Estimated: >60 mL/min (ref >60)
Glucose, Bld: 111 mg/dL — ABNORMAL HIGH (ref 70–99)
Potassium: 3.3 mmol/L — ABNORMAL LOW (ref 3.5–5.1)
Sodium: 141 mmol/L (ref 135–145)

## 2023-09-10 LAB — CBC
HCT: 39.9 % (ref 36.0–46.0)
Hemoglobin: 12.6 g/dL (ref 12.0–15.0)
MCH: 24.4 pg — ABNORMAL LOW (ref 26.0–34.0)
MCHC: 31.6 g/dL (ref 30.0–36.0)
MCV: 77.2 fL — ABNORMAL LOW (ref 80.0–100.0)
Platelets: 297 10*3/uL (ref 150–400)
RBC: 5.17 MIL/uL — ABNORMAL HIGH (ref 3.87–5.11)
RDW: 19.8 % — ABNORMAL HIGH (ref 11.5–15.5)
WBC: 7.3 10*3/uL (ref 4.0–10.5)
nRBC: 0 % (ref 0.0–0.2)

## 2023-09-10 LAB — URINALYSIS, ROUTINE W REFLEX MICROSCOPIC
Bilirubin Urine: NEGATIVE
Glucose, UA: NEGATIVE mg/dL
Hgb urine dipstick: NEGATIVE
Ketones, ur: NEGATIVE mg/dL
Leukocytes,Ua: NEGATIVE
Nitrite: NEGATIVE
Protein, ur: NEGATIVE mg/dL
Specific Gravity, Urine: 1.015 (ref 1.005–1.030)
pH: 5 (ref 5.0–8.0)

## 2023-09-10 LAB — RESP PANEL BY RT-PCR (RSV, FLU A&B, COVID)  RVPGX2
Influenza A by PCR: NEGATIVE
Influenza B by PCR: NEGATIVE
Resp Syncytial Virus by PCR: NEGATIVE
SARS Coronavirus 2 by RT PCR: NEGATIVE

## 2023-09-10 MED ORDER — SODIUM CHLORIDE 0.9 % IV BOLUS
500.0000 mL | Freq: Once | INTRAVENOUS | Status: AC
  Administered 2023-09-10: 500 mL via INTRAVENOUS

## 2023-09-10 MED ORDER — ONDANSETRON HCL 4 MG/2ML IJ SOLN
4.0000 mg | Freq: Once | INTRAMUSCULAR | Status: AC
  Administered 2023-09-10: 4 mg via INTRAVENOUS
  Filled 2023-09-10: qty 2

## 2023-09-10 NOTE — ED Triage Notes (Signed)
 C/O general sickness x several days.  VS wnl.  Lungs CTA, patient c/o fatigue.  162/81 CBG:  117 98.4 96% 88 NSR

## 2023-09-10 NOTE — ED Provider Notes (Signed)
 [facility] Provider Note    Event Date/Time   First MD Initiated Contact with Patient 09/10/23 1101     (approximate)   History   Weakness   HPI  Stacy Dalton is a 73 y.o. female with a history of COPD, hypertension, chronic back pain who presents with complaints of weakness and fatigue over the last 2 days.  She reports she thinks her nursing facility overreacted and sent her over here but she does admit to feeling generally fatigued.  No fevers reported.  No cough or shortness of breath.  No abdominal pain, no rash reported.  She does report chronic back pain unchanged     Physical Exam   Triage Vital Signs: ED Triage Vitals  Encounter Vitals Group     BP 09/10/23 1041 (!) 176/89     Systolic BP Percentile --      Diastolic BP Percentile --      Pulse Rate 09/10/23 1041 74     Resp 09/10/23 1041 18     Temp 09/10/23 1041 97.9 F (36.6 C)     Temp Source 09/10/23 1041 Oral     SpO2 09/10/23 1041 97 %     Weight 09/10/23 1041 69.6 kg (153 lb 7 oz)     Height 09/10/23 1041 1.549 m (5\' 1" )     Head Circumference --      Peak Flow --      Pain Score 09/10/23 1040 8     Pain Loc --      Pain Education --      Exclude from Growth Chart --     Most recent vital signs: Vitals:   09/10/23 1130 09/10/23 1200  BP: (!) 165/76 (!) 162/73  Pulse: 74 71  Resp: 15   Temp:    SpO2: 98% 100%     General: Awake, no distress.  CV:  Good peripheral perfusion.  Distal pulses normal Resp:  Normal effort.  Clear to auscultation bilaterally Abd:  No distention.  Soft, nontender Other:  Normal range motion of all extremities, neuroexam is unremarkable, cranial nerves II through XII are normal   ED Results / Procedures / Treatments   Labs (all labs ordered are listed, but only abnormal results are displayed) Labs Reviewed  BASIC METABOLIC PANEL - Abnormal; Notable for the following components:      Result Value   Potassium 3.3 (*)    CO2 18  (*)    Glucose, Bld 111 (*)    All other components within normal limits  CBC - Abnormal; Notable for the following components:   RBC 5.17 (*)    MCV 77.2 (*)    MCH 24.4 (*)    RDW 19.8 (*)    All other components within normal limits  URINALYSIS, ROUTINE W REFLEX MICROSCOPIC - Abnormal; Notable for the following components:   Color, Urine YELLOW (*)    APPearance HAZY (*)    All other components within normal limits  RESP PANEL BY RT-PCR (RSV, FLU A&B, COVID)  [id number]  CBG MONITORING, ED     EKG  ED ECG REPORT I, Jene Every, the attending physician, personally viewed and interpreted this ECG.  Date: 09/10/2023  Rhythm: normal sinus rhythm QRS Axis: normal Intervals: normal ST/T Wave abnormalities: normal Narrative Interpretation: no evidence of acute ischemia    RADIOLOGY Chest x-ray viewed interpret by me, no acute abnormality    PROCEDURES:  Critical Care performed:   Procedures  MEDICATIONS ORDERED IN ED: Medications  ondansetron (ZOFRAN) injection 4 mg (4 mg Intravenous Given 09/10/23 1156)  sodium chloride 0.9 % bolus 500 mL (0 mLs Intravenous Stopped 09/10/23 1311)     IMPRESSION / MDM / ASSESSMENT AND PLAN / ED COURSE  I reviewed the triage vital signs and the nursing notes. Patient's presentation is most consistent with acute presentation with potential threat to life or bodily function.  Patient presents with generalized weakness as detailed above, differential is extensive including electrolyte abnormalities, dehydration, infection  Will obtain labs, chest x-ray, give IV fluids and reevaluate she does complain of some mild nausea, will give IV Zofran and IV fluids  Lab work, including urinalysis, chest x-ray is quite reassuring, the patient feels much better and is ambulating around the room and patiently, she would like to be discharged, no indication for admission at this time, appropriate for discharge.  Recommend outpatient follow-up with  PCP      FINAL CLINICAL IMPRESSION(S) / ED DIAGNOSES   Final diagnoses:  Generalized weakness     Rx / DC Orders   ED Discharge Orders     None        Note:  This document was prepared using Dragon voice recognition software and may include unintentional dictation errors.   Jene Every, MD 09/10/23 1447

## 2023-09-10 NOTE — ED Triage Notes (Signed)
 Pt here with weakness. Pt states she does not feel well. Pt also c/o back pain. Pt denies coughing or sneezing. Pt denies VD but endorses nausea.

## 2023-09-17 DIAGNOSIS — R2681 Unsteadiness on feet: Secondary | ICD-10-CM | POA: Diagnosis not present

## 2023-09-17 DIAGNOSIS — M6281 Muscle weakness (generalized): Secondary | ICD-10-CM | POA: Diagnosis not present

## 2023-09-17 DIAGNOSIS — M158 Other polyosteoarthritis: Secondary | ICD-10-CM | POA: Diagnosis not present

## 2023-09-17 DIAGNOSIS — R488 Other symbolic dysfunctions: Secondary | ICD-10-CM | POA: Diagnosis not present

## 2023-09-17 DIAGNOSIS — R498 Other voice and resonance disorders: Secondary | ICD-10-CM | POA: Diagnosis not present

## 2023-09-17 DIAGNOSIS — G8929 Other chronic pain: Secondary | ICD-10-CM | POA: Diagnosis not present

## 2023-09-17 DIAGNOSIS — R2689 Other abnormalities of gait and mobility: Secondary | ICD-10-CM | POA: Diagnosis not present

## 2023-09-17 DIAGNOSIS — N390 Urinary tract infection, site not specified: Secondary | ICD-10-CM | POA: Diagnosis not present

## 2023-09-17 DIAGNOSIS — R269 Unspecified abnormalities of gait and mobility: Secondary | ICD-10-CM | POA: Diagnosis not present

## 2023-09-17 DIAGNOSIS — Z9181 History of falling: Secondary | ICD-10-CM | POA: Diagnosis not present

## 2023-09-19 DIAGNOSIS — R2681 Unsteadiness on feet: Secondary | ICD-10-CM | POA: Diagnosis not present

## 2023-09-19 DIAGNOSIS — Z9181 History of falling: Secondary | ICD-10-CM | POA: Diagnosis not present

## 2023-09-19 DIAGNOSIS — R2689 Other abnormalities of gait and mobility: Secondary | ICD-10-CM | POA: Diagnosis not present

## 2023-09-19 DIAGNOSIS — R488 Other symbolic dysfunctions: Secondary | ICD-10-CM | POA: Diagnosis not present

## 2023-09-19 DIAGNOSIS — R498 Other voice and resonance disorders: Secondary | ICD-10-CM | POA: Diagnosis not present

## 2023-09-19 DIAGNOSIS — M6281 Muscle weakness (generalized): Secondary | ICD-10-CM | POA: Diagnosis not present

## 2023-09-20 DIAGNOSIS — R2689 Other abnormalities of gait and mobility: Secondary | ICD-10-CM | POA: Diagnosis not present

## 2023-09-20 DIAGNOSIS — R2681 Unsteadiness on feet: Secondary | ICD-10-CM | POA: Diagnosis not present

## 2023-09-20 DIAGNOSIS — N390 Urinary tract infection, site not specified: Secondary | ICD-10-CM | POA: Diagnosis not present

## 2023-09-20 DIAGNOSIS — Z9181 History of falling: Secondary | ICD-10-CM | POA: Diagnosis not present

## 2023-09-20 DIAGNOSIS — R498 Other voice and resonance disorders: Secondary | ICD-10-CM | POA: Diagnosis not present

## 2023-09-20 DIAGNOSIS — M6281 Muscle weakness (generalized): Secondary | ICD-10-CM | POA: Diagnosis not present

## 2023-09-20 DIAGNOSIS — R488 Other symbolic dysfunctions: Secondary | ICD-10-CM | POA: Diagnosis not present

## 2023-09-20 DIAGNOSIS — M545 Low back pain, unspecified: Secondary | ICD-10-CM | POA: Diagnosis not present

## 2023-09-21 DIAGNOSIS — R488 Other symbolic dysfunctions: Secondary | ICD-10-CM | POA: Diagnosis not present

## 2023-09-21 DIAGNOSIS — M6281 Muscle weakness (generalized): Secondary | ICD-10-CM | POA: Diagnosis not present

## 2023-09-21 DIAGNOSIS — R498 Other voice and resonance disorders: Secondary | ICD-10-CM | POA: Diagnosis not present

## 2023-09-21 DIAGNOSIS — R2681 Unsteadiness on feet: Secondary | ICD-10-CM | POA: Diagnosis not present

## 2023-09-21 DIAGNOSIS — Z9181 History of falling: Secondary | ICD-10-CM | POA: Diagnosis not present

## 2023-09-21 DIAGNOSIS — R2689 Other abnormalities of gait and mobility: Secondary | ICD-10-CM | POA: Diagnosis not present

## 2023-09-22 DIAGNOSIS — M6281 Muscle weakness (generalized): Secondary | ICD-10-CM | POA: Diagnosis not present

## 2023-09-22 DIAGNOSIS — R2689 Other abnormalities of gait and mobility: Secondary | ICD-10-CM | POA: Diagnosis not present

## 2023-09-22 DIAGNOSIS — R488 Other symbolic dysfunctions: Secondary | ICD-10-CM | POA: Diagnosis not present

## 2023-09-22 DIAGNOSIS — R2681 Unsteadiness on feet: Secondary | ICD-10-CM | POA: Diagnosis not present

## 2023-09-22 DIAGNOSIS — R498 Other voice and resonance disorders: Secondary | ICD-10-CM | POA: Diagnosis not present

## 2023-09-22 DIAGNOSIS — Z9181 History of falling: Secondary | ICD-10-CM | POA: Diagnosis not present

## 2023-09-25 DIAGNOSIS — R2689 Other abnormalities of gait and mobility: Secondary | ICD-10-CM | POA: Diagnosis not present

## 2023-09-25 DIAGNOSIS — R488 Other symbolic dysfunctions: Secondary | ICD-10-CM | POA: Diagnosis not present

## 2023-09-25 DIAGNOSIS — R4182 Altered mental status, unspecified: Secondary | ICD-10-CM | POA: Diagnosis not present

## 2023-09-25 DIAGNOSIS — R498 Other voice and resonance disorders: Secondary | ICD-10-CM | POA: Diagnosis not present

## 2023-09-25 DIAGNOSIS — R2681 Unsteadiness on feet: Secondary | ICD-10-CM | POA: Diagnosis not present

## 2023-09-25 DIAGNOSIS — M6281 Muscle weakness (generalized): Secondary | ICD-10-CM | POA: Diagnosis not present

## 2023-09-25 DIAGNOSIS — Z9181 History of falling: Secondary | ICD-10-CM | POA: Diagnosis not present

## 2023-09-26 DIAGNOSIS — R498 Other voice and resonance disorders: Secondary | ICD-10-CM | POA: Diagnosis not present

## 2023-09-26 DIAGNOSIS — M6281 Muscle weakness (generalized): Secondary | ICD-10-CM | POA: Diagnosis not present

## 2023-09-26 DIAGNOSIS — Z9181 History of falling: Secondary | ICD-10-CM | POA: Diagnosis not present

## 2023-09-26 DIAGNOSIS — R2689 Other abnormalities of gait and mobility: Secondary | ICD-10-CM | POA: Diagnosis not present

## 2023-09-26 DIAGNOSIS — R2681 Unsteadiness on feet: Secondary | ICD-10-CM | POA: Diagnosis not present

## 2023-09-26 DIAGNOSIS — R488 Other symbolic dysfunctions: Secondary | ICD-10-CM | POA: Diagnosis not present

## 2023-09-27 DIAGNOSIS — R2689 Other abnormalities of gait and mobility: Secondary | ICD-10-CM | POA: Diagnosis not present

## 2023-09-27 DIAGNOSIS — R498 Other voice and resonance disorders: Secondary | ICD-10-CM | POA: Diagnosis not present

## 2023-09-27 DIAGNOSIS — Z9181 History of falling: Secondary | ICD-10-CM | POA: Diagnosis not present

## 2023-09-27 DIAGNOSIS — R2681 Unsteadiness on feet: Secondary | ICD-10-CM | POA: Diagnosis not present

## 2023-09-27 DIAGNOSIS — R488 Other symbolic dysfunctions: Secondary | ICD-10-CM | POA: Diagnosis not present

## 2023-09-27 DIAGNOSIS — M6281 Muscle weakness (generalized): Secondary | ICD-10-CM | POA: Diagnosis not present

## 2023-09-28 DIAGNOSIS — I10 Essential (primary) hypertension: Secondary | ICD-10-CM | POA: Diagnosis not present

## 2023-09-28 DIAGNOSIS — Z9181 History of falling: Secondary | ICD-10-CM | POA: Diagnosis not present

## 2023-09-28 DIAGNOSIS — R2689 Other abnormalities of gait and mobility: Secondary | ICD-10-CM | POA: Diagnosis not present

## 2023-09-28 DIAGNOSIS — R2681 Unsteadiness on feet: Secondary | ICD-10-CM | POA: Diagnosis not present

## 2023-09-28 DIAGNOSIS — R488 Other symbolic dysfunctions: Secondary | ICD-10-CM | POA: Diagnosis not present

## 2023-09-28 DIAGNOSIS — R498 Other voice and resonance disorders: Secondary | ICD-10-CM | POA: Diagnosis not present

## 2023-09-28 DIAGNOSIS — M6281 Muscle weakness (generalized): Secondary | ICD-10-CM | POA: Diagnosis not present

## 2023-09-28 DIAGNOSIS — J449 Chronic obstructive pulmonary disease, unspecified: Secondary | ICD-10-CM | POA: Diagnosis not present

## 2023-10-01 DIAGNOSIS — E039 Hypothyroidism, unspecified: Secondary | ICD-10-CM | POA: Diagnosis not present

## 2023-10-01 DIAGNOSIS — R2681 Unsteadiness on feet: Secondary | ICD-10-CM | POA: Diagnosis not present

## 2023-10-01 DIAGNOSIS — R42 Dizziness and giddiness: Secondary | ICD-10-CM | POA: Diagnosis not present

## 2023-10-01 DIAGNOSIS — R2689 Other abnormalities of gait and mobility: Secondary | ICD-10-CM | POA: Diagnosis not present

## 2023-10-01 DIAGNOSIS — R488 Other symbolic dysfunctions: Secondary | ICD-10-CM | POA: Diagnosis not present

## 2023-10-01 DIAGNOSIS — M6281 Muscle weakness (generalized): Secondary | ICD-10-CM | POA: Diagnosis not present

## 2023-10-01 DIAGNOSIS — R498 Other voice and resonance disorders: Secondary | ICD-10-CM | POA: Diagnosis not present

## 2023-10-01 DIAGNOSIS — I10 Essential (primary) hypertension: Secondary | ICD-10-CM | POA: Diagnosis not present

## 2023-10-01 DIAGNOSIS — Z9181 History of falling: Secondary | ICD-10-CM | POA: Diagnosis not present

## 2023-10-02 DIAGNOSIS — R2681 Unsteadiness on feet: Secondary | ICD-10-CM | POA: Diagnosis not present

## 2023-10-02 DIAGNOSIS — R2689 Other abnormalities of gait and mobility: Secondary | ICD-10-CM | POA: Diagnosis not present

## 2023-10-02 DIAGNOSIS — N390 Urinary tract infection, site not specified: Secondary | ICD-10-CM | POA: Diagnosis not present

## 2023-10-02 DIAGNOSIS — R488 Other symbolic dysfunctions: Secondary | ICD-10-CM | POA: Diagnosis not present

## 2023-10-02 DIAGNOSIS — M545 Low back pain, unspecified: Secondary | ICD-10-CM | POA: Diagnosis not present

## 2023-10-02 DIAGNOSIS — Z9181 History of falling: Secondary | ICD-10-CM | POA: Diagnosis not present

## 2023-10-02 DIAGNOSIS — M158 Other polyosteoarthritis: Secondary | ICD-10-CM | POA: Diagnosis not present

## 2023-10-02 DIAGNOSIS — M6281 Muscle weakness (generalized): Secondary | ICD-10-CM | POA: Diagnosis not present

## 2023-10-02 DIAGNOSIS — R498 Other voice and resonance disorders: Secondary | ICD-10-CM | POA: Diagnosis not present

## 2023-10-03 DIAGNOSIS — Z9181 History of falling: Secondary | ICD-10-CM | POA: Diagnosis not present

## 2023-10-03 DIAGNOSIS — R498 Other voice and resonance disorders: Secondary | ICD-10-CM | POA: Diagnosis not present

## 2023-10-03 DIAGNOSIS — R2681 Unsteadiness on feet: Secondary | ICD-10-CM | POA: Diagnosis not present

## 2023-10-03 DIAGNOSIS — R488 Other symbolic dysfunctions: Secondary | ICD-10-CM | POA: Diagnosis not present

## 2023-10-03 DIAGNOSIS — M6281 Muscle weakness (generalized): Secondary | ICD-10-CM | POA: Diagnosis not present

## 2023-10-03 DIAGNOSIS — R2689 Other abnormalities of gait and mobility: Secondary | ICD-10-CM | POA: Diagnosis not present

## 2023-10-04 DIAGNOSIS — Z9181 History of falling: Secondary | ICD-10-CM | POA: Diagnosis not present

## 2023-10-04 DIAGNOSIS — N182 Chronic kidney disease, stage 2 (mild): Secondary | ICD-10-CM | POA: Diagnosis not present

## 2023-10-04 DIAGNOSIS — R488 Other symbolic dysfunctions: Secondary | ICD-10-CM | POA: Diagnosis not present

## 2023-10-04 DIAGNOSIS — M6281 Muscle weakness (generalized): Secondary | ICD-10-CM | POA: Diagnosis not present

## 2023-10-04 DIAGNOSIS — I129 Hypertensive chronic kidney disease with stage 1 through stage 4 chronic kidney disease, or unspecified chronic kidney disease: Secondary | ICD-10-CM | POA: Diagnosis not present

## 2023-10-04 DIAGNOSIS — I10 Essential (primary) hypertension: Secondary | ICD-10-CM | POA: Diagnosis not present

## 2023-10-04 DIAGNOSIS — R498 Other voice and resonance disorders: Secondary | ICD-10-CM | POA: Diagnosis not present

## 2023-10-04 DIAGNOSIS — Z131 Encounter for screening for diabetes mellitus: Secondary | ICD-10-CM | POA: Diagnosis not present

## 2023-10-04 DIAGNOSIS — R2681 Unsteadiness on feet: Secondary | ICD-10-CM | POA: Diagnosis not present

## 2023-10-04 DIAGNOSIS — R2689 Other abnormalities of gait and mobility: Secondary | ICD-10-CM | POA: Diagnosis not present

## 2023-10-05 DIAGNOSIS — R2689 Other abnormalities of gait and mobility: Secondary | ICD-10-CM | POA: Diagnosis not present

## 2023-10-05 DIAGNOSIS — R2681 Unsteadiness on feet: Secondary | ICD-10-CM | POA: Diagnosis not present

## 2023-10-05 DIAGNOSIS — R498 Other voice and resonance disorders: Secondary | ICD-10-CM | POA: Diagnosis not present

## 2023-10-05 DIAGNOSIS — Z9181 History of falling: Secondary | ICD-10-CM | POA: Diagnosis not present

## 2023-10-05 DIAGNOSIS — M6281 Muscle weakness (generalized): Secondary | ICD-10-CM | POA: Diagnosis not present

## 2023-10-05 DIAGNOSIS — R488 Other symbolic dysfunctions: Secondary | ICD-10-CM | POA: Diagnosis not present

## 2023-10-06 DIAGNOSIS — R2689 Other abnormalities of gait and mobility: Secondary | ICD-10-CM | POA: Diagnosis not present

## 2023-10-06 DIAGNOSIS — M6281 Muscle weakness (generalized): Secondary | ICD-10-CM | POA: Diagnosis not present

## 2023-10-06 DIAGNOSIS — Z9181 History of falling: Secondary | ICD-10-CM | POA: Diagnosis not present

## 2023-10-06 DIAGNOSIS — R2681 Unsteadiness on feet: Secondary | ICD-10-CM | POA: Diagnosis not present

## 2023-10-06 DIAGNOSIS — R498 Other voice and resonance disorders: Secondary | ICD-10-CM | POA: Diagnosis not present

## 2023-10-06 DIAGNOSIS — R488 Other symbolic dysfunctions: Secondary | ICD-10-CM | POA: Diagnosis not present

## 2023-10-08 DIAGNOSIS — R4182 Altered mental status, unspecified: Secondary | ICD-10-CM | POA: Diagnosis not present

## 2023-10-08 DIAGNOSIS — R2681 Unsteadiness on feet: Secondary | ICD-10-CM | POA: Diagnosis not present

## 2023-10-08 DIAGNOSIS — R498 Other voice and resonance disorders: Secondary | ICD-10-CM | POA: Diagnosis not present

## 2023-10-08 DIAGNOSIS — Z9181 History of falling: Secondary | ICD-10-CM | POA: Diagnosis not present

## 2023-10-08 DIAGNOSIS — R2689 Other abnormalities of gait and mobility: Secondary | ICD-10-CM | POA: Diagnosis not present

## 2023-10-08 DIAGNOSIS — M545 Low back pain, unspecified: Secondary | ICD-10-CM | POA: Diagnosis not present

## 2023-10-08 DIAGNOSIS — R488 Other symbolic dysfunctions: Secondary | ICD-10-CM | POA: Diagnosis not present

## 2023-10-08 DIAGNOSIS — Z91199 Patient's noncompliance with other medical treatment and regimen due to unspecified reason: Secondary | ICD-10-CM | POA: Diagnosis not present

## 2023-10-08 DIAGNOSIS — M6281 Muscle weakness (generalized): Secondary | ICD-10-CM | POA: Diagnosis not present

## 2023-10-08 DIAGNOSIS — N390 Urinary tract infection, site not specified: Secondary | ICD-10-CM | POA: Diagnosis not present

## 2023-10-09 DIAGNOSIS — R2681 Unsteadiness on feet: Secondary | ICD-10-CM | POA: Diagnosis not present

## 2023-10-09 DIAGNOSIS — R2689 Other abnormalities of gait and mobility: Secondary | ICD-10-CM | POA: Diagnosis not present

## 2023-10-09 DIAGNOSIS — M6281 Muscle weakness (generalized): Secondary | ICD-10-CM | POA: Diagnosis not present

## 2023-10-09 DIAGNOSIS — R498 Other voice and resonance disorders: Secondary | ICD-10-CM | POA: Diagnosis not present

## 2023-10-09 DIAGNOSIS — Z9181 History of falling: Secondary | ICD-10-CM | POA: Diagnosis not present

## 2023-10-09 DIAGNOSIS — R488 Other symbolic dysfunctions: Secondary | ICD-10-CM | POA: Diagnosis not present

## 2023-10-10 DIAGNOSIS — R300 Dysuria: Secondary | ICD-10-CM | POA: Diagnosis not present

## 2023-10-10 DIAGNOSIS — G43909 Migraine, unspecified, not intractable, without status migrainosus: Secondary | ICD-10-CM | POA: Diagnosis not present

## 2023-10-10 DIAGNOSIS — I10 Essential (primary) hypertension: Secondary | ICD-10-CM | POA: Diagnosis not present

## 2023-10-10 DIAGNOSIS — J449 Chronic obstructive pulmonary disease, unspecified: Secondary | ICD-10-CM | POA: Diagnosis not present

## 2023-10-10 DIAGNOSIS — E039 Hypothyroidism, unspecified: Secondary | ICD-10-CM | POA: Diagnosis not present

## 2023-10-10 DIAGNOSIS — Z87440 Personal history of urinary (tract) infections: Secondary | ICD-10-CM | POA: Diagnosis not present

## 2023-10-10 DIAGNOSIS — G894 Chronic pain syndrome: Secondary | ICD-10-CM | POA: Diagnosis not present

## 2023-10-10 DIAGNOSIS — M545 Low back pain, unspecified: Secondary | ICD-10-CM | POA: Diagnosis not present

## 2023-10-10 DIAGNOSIS — E876 Hypokalemia: Secondary | ICD-10-CM | POA: Diagnosis not present

## 2023-10-10 DIAGNOSIS — R2689 Other abnormalities of gait and mobility: Secondary | ICD-10-CM | POA: Diagnosis not present

## 2023-10-10 DIAGNOSIS — Z91199 Patient's noncompliance with other medical treatment and regimen due to unspecified reason: Secondary | ICD-10-CM | POA: Diagnosis not present

## 2023-10-10 DIAGNOSIS — Z9181 History of falling: Secondary | ICD-10-CM | POA: Diagnosis not present

## 2023-10-10 DIAGNOSIS — R488 Other symbolic dysfunctions: Secondary | ICD-10-CM | POA: Diagnosis not present

## 2023-10-10 DIAGNOSIS — R2681 Unsteadiness on feet: Secondary | ICD-10-CM | POA: Diagnosis not present

## 2023-10-10 DIAGNOSIS — R498 Other voice and resonance disorders: Secondary | ICD-10-CM | POA: Diagnosis not present

## 2023-10-10 DIAGNOSIS — E782 Mixed hyperlipidemia: Secondary | ICD-10-CM | POA: Diagnosis not present

## 2023-10-10 DIAGNOSIS — M6281 Muscle weakness (generalized): Secondary | ICD-10-CM | POA: Diagnosis not present

## 2023-10-11 DIAGNOSIS — M6281 Muscle weakness (generalized): Secondary | ICD-10-CM | POA: Diagnosis not present

## 2023-10-11 DIAGNOSIS — R498 Other voice and resonance disorders: Secondary | ICD-10-CM | POA: Diagnosis not present

## 2023-10-11 DIAGNOSIS — R2689 Other abnormalities of gait and mobility: Secondary | ICD-10-CM | POA: Diagnosis not present

## 2023-10-11 DIAGNOSIS — Z9181 History of falling: Secondary | ICD-10-CM | POA: Diagnosis not present

## 2023-10-11 DIAGNOSIS — R2681 Unsteadiness on feet: Secondary | ICD-10-CM | POA: Diagnosis not present

## 2023-10-11 DIAGNOSIS — R488 Other symbolic dysfunctions: Secondary | ICD-10-CM | POA: Diagnosis not present

## 2023-10-12 DIAGNOSIS — R2689 Other abnormalities of gait and mobility: Secondary | ICD-10-CM | POA: Diagnosis not present

## 2023-10-12 DIAGNOSIS — R498 Other voice and resonance disorders: Secondary | ICD-10-CM | POA: Diagnosis not present

## 2023-10-12 DIAGNOSIS — R2681 Unsteadiness on feet: Secondary | ICD-10-CM | POA: Diagnosis not present

## 2023-10-12 DIAGNOSIS — M6281 Muscle weakness (generalized): Secondary | ICD-10-CM | POA: Diagnosis not present

## 2023-10-12 DIAGNOSIS — R488 Other symbolic dysfunctions: Secondary | ICD-10-CM | POA: Diagnosis not present

## 2023-10-12 DIAGNOSIS — Z9181 History of falling: Secondary | ICD-10-CM | POA: Diagnosis not present

## 2023-10-13 DIAGNOSIS — Z9181 History of falling: Secondary | ICD-10-CM | POA: Diagnosis not present

## 2023-10-13 DIAGNOSIS — R498 Other voice and resonance disorders: Secondary | ICD-10-CM | POA: Diagnosis not present

## 2023-10-13 DIAGNOSIS — R2681 Unsteadiness on feet: Secondary | ICD-10-CM | POA: Diagnosis not present

## 2023-10-13 DIAGNOSIS — R488 Other symbolic dysfunctions: Secondary | ICD-10-CM | POA: Diagnosis not present

## 2023-10-13 DIAGNOSIS — M6281 Muscle weakness (generalized): Secondary | ICD-10-CM | POA: Diagnosis not present

## 2023-10-13 DIAGNOSIS — R2689 Other abnormalities of gait and mobility: Secondary | ICD-10-CM | POA: Diagnosis not present

## 2023-10-14 DIAGNOSIS — R498 Other voice and resonance disorders: Secondary | ICD-10-CM | POA: Diagnosis not present

## 2023-10-14 DIAGNOSIS — R2689 Other abnormalities of gait and mobility: Secondary | ICD-10-CM | POA: Diagnosis not present

## 2023-10-14 DIAGNOSIS — M6281 Muscle weakness (generalized): Secondary | ICD-10-CM | POA: Diagnosis not present

## 2023-10-14 DIAGNOSIS — R2681 Unsteadiness on feet: Secondary | ICD-10-CM | POA: Diagnosis not present

## 2023-10-14 DIAGNOSIS — Z9181 History of falling: Secondary | ICD-10-CM | POA: Diagnosis not present

## 2023-10-14 DIAGNOSIS — R488 Other symbolic dysfunctions: Secondary | ICD-10-CM | POA: Diagnosis not present

## 2023-10-15 DIAGNOSIS — M545 Low back pain, unspecified: Secondary | ICD-10-CM | POA: Diagnosis not present

## 2023-10-15 DIAGNOSIS — R488 Other symbolic dysfunctions: Secondary | ICD-10-CM | POA: Diagnosis not present

## 2023-10-15 DIAGNOSIS — R2689 Other abnormalities of gait and mobility: Secondary | ICD-10-CM | POA: Diagnosis not present

## 2023-10-15 DIAGNOSIS — M6281 Muscle weakness (generalized): Secondary | ICD-10-CM | POA: Diagnosis not present

## 2023-10-15 DIAGNOSIS — K1120 Sialoadenitis, unspecified: Secondary | ICD-10-CM | POA: Diagnosis not present

## 2023-10-15 DIAGNOSIS — Z79899 Other long term (current) drug therapy: Secondary | ICD-10-CM | POA: Diagnosis not present

## 2023-10-15 DIAGNOSIS — R2681 Unsteadiness on feet: Secondary | ICD-10-CM | POA: Diagnosis not present

## 2023-10-15 DIAGNOSIS — K029 Dental caries, unspecified: Secondary | ICD-10-CM | POA: Diagnosis not present

## 2023-10-15 DIAGNOSIS — R498 Other voice and resonance disorders: Secondary | ICD-10-CM | POA: Diagnosis not present

## 2023-10-15 DIAGNOSIS — Z9181 History of falling: Secondary | ICD-10-CM | POA: Diagnosis not present

## 2023-10-16 DIAGNOSIS — M6281 Muscle weakness (generalized): Secondary | ICD-10-CM | POA: Diagnosis not present

## 2023-10-16 DIAGNOSIS — Z9181 History of falling: Secondary | ICD-10-CM | POA: Diagnosis not present

## 2023-10-16 DIAGNOSIS — R498 Other voice and resonance disorders: Secondary | ICD-10-CM | POA: Diagnosis not present

## 2023-10-16 DIAGNOSIS — R488 Other symbolic dysfunctions: Secondary | ICD-10-CM | POA: Diagnosis not present

## 2023-10-16 DIAGNOSIS — R2681 Unsteadiness on feet: Secondary | ICD-10-CM | POA: Diagnosis not present

## 2023-10-16 DIAGNOSIS — R2689 Other abnormalities of gait and mobility: Secondary | ICD-10-CM | POA: Diagnosis not present

## 2023-10-17 DIAGNOSIS — R2681 Unsteadiness on feet: Secondary | ICD-10-CM | POA: Diagnosis not present

## 2023-10-17 DIAGNOSIS — Z9181 History of falling: Secondary | ICD-10-CM | POA: Diagnosis not present

## 2023-10-17 DIAGNOSIS — M6281 Muscle weakness (generalized): Secondary | ICD-10-CM | POA: Diagnosis not present

## 2023-10-17 DIAGNOSIS — R488 Other symbolic dysfunctions: Secondary | ICD-10-CM | POA: Diagnosis not present

## 2023-10-17 DIAGNOSIS — R2689 Other abnormalities of gait and mobility: Secondary | ICD-10-CM | POA: Diagnosis not present

## 2023-10-17 DIAGNOSIS — J449 Chronic obstructive pulmonary disease, unspecified: Secondary | ICD-10-CM | POA: Diagnosis not present

## 2023-10-17 DIAGNOSIS — G43909 Migraine, unspecified, not intractable, without status migrainosus: Secondary | ICD-10-CM | POA: Diagnosis not present

## 2023-10-17 DIAGNOSIS — R498 Other voice and resonance disorders: Secondary | ICD-10-CM | POA: Diagnosis not present

## 2023-10-18 DIAGNOSIS — R488 Other symbolic dysfunctions: Secondary | ICD-10-CM | POA: Diagnosis not present

## 2023-10-18 DIAGNOSIS — R498 Other voice and resonance disorders: Secondary | ICD-10-CM | POA: Diagnosis not present

## 2023-10-18 DIAGNOSIS — R2681 Unsteadiness on feet: Secondary | ICD-10-CM | POA: Diagnosis not present

## 2023-10-18 DIAGNOSIS — R2689 Other abnormalities of gait and mobility: Secondary | ICD-10-CM | POA: Diagnosis not present

## 2023-10-18 DIAGNOSIS — M6281 Muscle weakness (generalized): Secondary | ICD-10-CM | POA: Diagnosis not present

## 2023-10-18 DIAGNOSIS — Z9181 History of falling: Secondary | ICD-10-CM | POA: Diagnosis not present

## 2023-10-19 DIAGNOSIS — Z9181 History of falling: Secondary | ICD-10-CM | POA: Diagnosis not present

## 2023-10-19 DIAGNOSIS — R498 Other voice and resonance disorders: Secondary | ICD-10-CM | POA: Diagnosis not present

## 2023-10-19 DIAGNOSIS — R2681 Unsteadiness on feet: Secondary | ICD-10-CM | POA: Diagnosis not present

## 2023-10-19 DIAGNOSIS — R2689 Other abnormalities of gait and mobility: Secondary | ICD-10-CM | POA: Diagnosis not present

## 2023-10-19 DIAGNOSIS — M6281 Muscle weakness (generalized): Secondary | ICD-10-CM | POA: Diagnosis not present

## 2023-10-19 DIAGNOSIS — R488 Other symbolic dysfunctions: Secondary | ICD-10-CM | POA: Diagnosis not present

## 2023-10-20 DIAGNOSIS — R488 Other symbolic dysfunctions: Secondary | ICD-10-CM | POA: Diagnosis not present

## 2023-10-20 DIAGNOSIS — R2681 Unsteadiness on feet: Secondary | ICD-10-CM | POA: Diagnosis not present

## 2023-10-20 DIAGNOSIS — M6281 Muscle weakness (generalized): Secondary | ICD-10-CM | POA: Diagnosis not present

## 2023-10-20 DIAGNOSIS — Z9181 History of falling: Secondary | ICD-10-CM | POA: Diagnosis not present

## 2023-10-20 DIAGNOSIS — R2689 Other abnormalities of gait and mobility: Secondary | ICD-10-CM | POA: Diagnosis not present

## 2023-10-20 DIAGNOSIS — R498 Other voice and resonance disorders: Secondary | ICD-10-CM | POA: Diagnosis not present

## 2023-10-22 DIAGNOSIS — R2689 Other abnormalities of gait and mobility: Secondary | ICD-10-CM | POA: Diagnosis not present

## 2023-10-22 DIAGNOSIS — R2681 Unsteadiness on feet: Secondary | ICD-10-CM | POA: Diagnosis not present

## 2023-10-22 DIAGNOSIS — M6281 Muscle weakness (generalized): Secondary | ICD-10-CM | POA: Diagnosis not present

## 2023-10-22 DIAGNOSIS — R488 Other symbolic dysfunctions: Secondary | ICD-10-CM | POA: Diagnosis not present

## 2023-10-22 DIAGNOSIS — R498 Other voice and resonance disorders: Secondary | ICD-10-CM | POA: Diagnosis not present

## 2023-10-22 DIAGNOSIS — Z9181 History of falling: Secondary | ICD-10-CM | POA: Diagnosis not present

## 2023-10-23 DIAGNOSIS — M6281 Muscle weakness (generalized): Secondary | ICD-10-CM | POA: Diagnosis not present

## 2023-10-23 DIAGNOSIS — R498 Other voice and resonance disorders: Secondary | ICD-10-CM | POA: Diagnosis not present

## 2023-10-23 DIAGNOSIS — R2689 Other abnormalities of gait and mobility: Secondary | ICD-10-CM | POA: Diagnosis not present

## 2023-10-23 DIAGNOSIS — Z9181 History of falling: Secondary | ICD-10-CM | POA: Diagnosis not present

## 2023-10-23 DIAGNOSIS — R2681 Unsteadiness on feet: Secondary | ICD-10-CM | POA: Diagnosis not present

## 2023-10-23 DIAGNOSIS — R488 Other symbolic dysfunctions: Secondary | ICD-10-CM | POA: Diagnosis not present

## 2023-10-24 DIAGNOSIS — M6281 Muscle weakness (generalized): Secondary | ICD-10-CM | POA: Diagnosis not present

## 2023-10-24 DIAGNOSIS — R498 Other voice and resonance disorders: Secondary | ICD-10-CM | POA: Diagnosis not present

## 2023-10-24 DIAGNOSIS — R2689 Other abnormalities of gait and mobility: Secondary | ICD-10-CM | POA: Diagnosis not present

## 2023-10-24 DIAGNOSIS — Z9181 History of falling: Secondary | ICD-10-CM | POA: Diagnosis not present

## 2023-10-24 DIAGNOSIS — R2681 Unsteadiness on feet: Secondary | ICD-10-CM | POA: Diagnosis not present

## 2023-10-24 DIAGNOSIS — R488 Other symbolic dysfunctions: Secondary | ICD-10-CM | POA: Diagnosis not present

## 2023-10-25 DIAGNOSIS — R2689 Other abnormalities of gait and mobility: Secondary | ICD-10-CM | POA: Diagnosis not present

## 2023-10-25 DIAGNOSIS — M6281 Muscle weakness (generalized): Secondary | ICD-10-CM | POA: Diagnosis not present

## 2023-10-25 DIAGNOSIS — R2681 Unsteadiness on feet: Secondary | ICD-10-CM | POA: Diagnosis not present

## 2023-10-25 DIAGNOSIS — R488 Other symbolic dysfunctions: Secondary | ICD-10-CM | POA: Diagnosis not present

## 2023-10-25 DIAGNOSIS — Z9181 History of falling: Secondary | ICD-10-CM | POA: Diagnosis not present

## 2023-10-25 DIAGNOSIS — R498 Other voice and resonance disorders: Secondary | ICD-10-CM | POA: Diagnosis not present

## 2023-10-26 DIAGNOSIS — R488 Other symbolic dysfunctions: Secondary | ICD-10-CM | POA: Diagnosis not present

## 2023-10-26 DIAGNOSIS — R2689 Other abnormalities of gait and mobility: Secondary | ICD-10-CM | POA: Diagnosis not present

## 2023-10-26 DIAGNOSIS — R2681 Unsteadiness on feet: Secondary | ICD-10-CM | POA: Diagnosis not present

## 2023-10-26 DIAGNOSIS — R498 Other voice and resonance disorders: Secondary | ICD-10-CM | POA: Diagnosis not present

## 2023-10-26 DIAGNOSIS — Z9181 History of falling: Secondary | ICD-10-CM | POA: Diagnosis not present

## 2023-10-26 DIAGNOSIS — M6281 Muscle weakness (generalized): Secondary | ICD-10-CM | POA: Diagnosis not present

## 2023-10-27 DIAGNOSIS — R2689 Other abnormalities of gait and mobility: Secondary | ICD-10-CM | POA: Diagnosis not present

## 2023-10-27 DIAGNOSIS — M6281 Muscle weakness (generalized): Secondary | ICD-10-CM | POA: Diagnosis not present

## 2023-10-27 DIAGNOSIS — R498 Other voice and resonance disorders: Secondary | ICD-10-CM | POA: Diagnosis not present

## 2023-10-27 DIAGNOSIS — R488 Other symbolic dysfunctions: Secondary | ICD-10-CM | POA: Diagnosis not present

## 2023-10-27 DIAGNOSIS — R2681 Unsteadiness on feet: Secondary | ICD-10-CM | POA: Diagnosis not present

## 2023-10-27 DIAGNOSIS — Z9181 History of falling: Secondary | ICD-10-CM | POA: Diagnosis not present

## 2023-10-29 DIAGNOSIS — R2689 Other abnormalities of gait and mobility: Secondary | ICD-10-CM | POA: Diagnosis not present

## 2023-10-29 DIAGNOSIS — R488 Other symbolic dysfunctions: Secondary | ICD-10-CM | POA: Diagnosis not present

## 2023-10-29 DIAGNOSIS — R498 Other voice and resonance disorders: Secondary | ICD-10-CM | POA: Diagnosis not present

## 2023-10-29 DIAGNOSIS — R2681 Unsteadiness on feet: Secondary | ICD-10-CM | POA: Diagnosis not present

## 2023-10-29 DIAGNOSIS — M6281 Muscle weakness (generalized): Secondary | ICD-10-CM | POA: Diagnosis not present

## 2023-10-29 DIAGNOSIS — Z9181 History of falling: Secondary | ICD-10-CM | POA: Diagnosis not present

## 2023-10-30 DIAGNOSIS — R498 Other voice and resonance disorders: Secondary | ICD-10-CM | POA: Diagnosis not present

## 2023-10-30 DIAGNOSIS — R2689 Other abnormalities of gait and mobility: Secondary | ICD-10-CM | POA: Diagnosis not present

## 2023-10-30 DIAGNOSIS — Z9181 History of falling: Secondary | ICD-10-CM | POA: Diagnosis not present

## 2023-10-30 DIAGNOSIS — R488 Other symbolic dysfunctions: Secondary | ICD-10-CM | POA: Diagnosis not present

## 2023-10-30 DIAGNOSIS — R2681 Unsteadiness on feet: Secondary | ICD-10-CM | POA: Diagnosis not present

## 2023-10-30 DIAGNOSIS — M6281 Muscle weakness (generalized): Secondary | ICD-10-CM | POA: Diagnosis not present

## 2023-10-31 DIAGNOSIS — Z9181 History of falling: Secondary | ICD-10-CM | POA: Diagnosis not present

## 2023-10-31 DIAGNOSIS — R2681 Unsteadiness on feet: Secondary | ICD-10-CM | POA: Diagnosis not present

## 2023-10-31 DIAGNOSIS — R498 Other voice and resonance disorders: Secondary | ICD-10-CM | POA: Diagnosis not present

## 2023-10-31 DIAGNOSIS — R488 Other symbolic dysfunctions: Secondary | ICD-10-CM | POA: Diagnosis not present

## 2023-10-31 DIAGNOSIS — M6281 Muscle weakness (generalized): Secondary | ICD-10-CM | POA: Diagnosis not present

## 2023-10-31 DIAGNOSIS — R2689 Other abnormalities of gait and mobility: Secondary | ICD-10-CM | POA: Diagnosis not present

## 2023-11-01 DIAGNOSIS — R2689 Other abnormalities of gait and mobility: Secondary | ICD-10-CM | POA: Diagnosis not present

## 2023-11-01 DIAGNOSIS — R498 Other voice and resonance disorders: Secondary | ICD-10-CM | POA: Diagnosis not present

## 2023-11-01 DIAGNOSIS — R488 Other symbolic dysfunctions: Secondary | ICD-10-CM | POA: Diagnosis not present

## 2023-11-01 DIAGNOSIS — M158 Other polyosteoarthritis: Secondary | ICD-10-CM | POA: Diagnosis not present

## 2023-11-01 DIAGNOSIS — R2681 Unsteadiness on feet: Secondary | ICD-10-CM | POA: Diagnosis not present

## 2023-11-01 DIAGNOSIS — Z9181 History of falling: Secondary | ICD-10-CM | POA: Diagnosis not present

## 2023-11-01 DIAGNOSIS — M6281 Muscle weakness (generalized): Secondary | ICD-10-CM | POA: Diagnosis not present

## 2023-11-02 DIAGNOSIS — Z9181 History of falling: Secondary | ICD-10-CM | POA: Diagnosis not present

## 2023-11-02 DIAGNOSIS — R2681 Unsteadiness on feet: Secondary | ICD-10-CM | POA: Diagnosis not present

## 2023-11-02 DIAGNOSIS — R2689 Other abnormalities of gait and mobility: Secondary | ICD-10-CM | POA: Diagnosis not present

## 2023-11-02 DIAGNOSIS — R498 Other voice and resonance disorders: Secondary | ICD-10-CM | POA: Diagnosis not present

## 2023-11-02 DIAGNOSIS — R488 Other symbolic dysfunctions: Secondary | ICD-10-CM | POA: Diagnosis not present

## 2023-11-02 DIAGNOSIS — M6281 Muscle weakness (generalized): Secondary | ICD-10-CM | POA: Diagnosis not present

## 2023-11-05 DIAGNOSIS — M6281 Muscle weakness (generalized): Secondary | ICD-10-CM | POA: Diagnosis not present

## 2023-11-05 DIAGNOSIS — R498 Other voice and resonance disorders: Secondary | ICD-10-CM | POA: Diagnosis not present

## 2023-11-05 DIAGNOSIS — R2689 Other abnormalities of gait and mobility: Secondary | ICD-10-CM | POA: Diagnosis not present

## 2023-11-05 DIAGNOSIS — Z9181 History of falling: Secondary | ICD-10-CM | POA: Diagnosis not present

## 2023-11-05 DIAGNOSIS — R488 Other symbolic dysfunctions: Secondary | ICD-10-CM | POA: Diagnosis not present

## 2023-11-05 DIAGNOSIS — I83819 Varicose veins of unspecified lower extremities with pain: Secondary | ICD-10-CM | POA: Diagnosis not present

## 2023-11-05 DIAGNOSIS — E039 Hypothyroidism, unspecified: Secondary | ICD-10-CM | POA: Diagnosis not present

## 2023-11-05 DIAGNOSIS — I10 Essential (primary) hypertension: Secondary | ICD-10-CM | POA: Diagnosis not present

## 2023-11-05 DIAGNOSIS — G4700 Insomnia, unspecified: Secondary | ICD-10-CM | POA: Diagnosis not present

## 2023-11-05 DIAGNOSIS — J449 Chronic obstructive pulmonary disease, unspecified: Secondary | ICD-10-CM | POA: Diagnosis not present

## 2023-11-05 DIAGNOSIS — R2681 Unsteadiness on feet: Secondary | ICD-10-CM | POA: Diagnosis not present

## 2023-11-05 DIAGNOSIS — Z008 Encounter for other general examination: Secondary | ICD-10-CM | POA: Diagnosis not present

## 2023-11-05 DIAGNOSIS — M549 Dorsalgia, unspecified: Secondary | ICD-10-CM | POA: Diagnosis not present

## 2023-11-05 DIAGNOSIS — F321 Major depressive disorder, single episode, moderate: Secondary | ICD-10-CM | POA: Diagnosis not present

## 2023-11-06 DIAGNOSIS — R498 Other voice and resonance disorders: Secondary | ICD-10-CM | POA: Diagnosis not present

## 2023-11-06 DIAGNOSIS — R488 Other symbolic dysfunctions: Secondary | ICD-10-CM | POA: Diagnosis not present

## 2023-11-06 DIAGNOSIS — R2689 Other abnormalities of gait and mobility: Secondary | ICD-10-CM | POA: Diagnosis not present

## 2023-11-06 DIAGNOSIS — R2681 Unsteadiness on feet: Secondary | ICD-10-CM | POA: Diagnosis not present

## 2023-11-06 DIAGNOSIS — Z9181 History of falling: Secondary | ICD-10-CM | POA: Diagnosis not present

## 2023-11-06 DIAGNOSIS — M6281 Muscle weakness (generalized): Secondary | ICD-10-CM | POA: Diagnosis not present

## 2023-11-07 DIAGNOSIS — R498 Other voice and resonance disorders: Secondary | ICD-10-CM | POA: Diagnosis not present

## 2023-11-07 DIAGNOSIS — Z9181 History of falling: Secondary | ICD-10-CM | POA: Diagnosis not present

## 2023-11-07 DIAGNOSIS — R2689 Other abnormalities of gait and mobility: Secondary | ICD-10-CM | POA: Diagnosis not present

## 2023-11-07 DIAGNOSIS — R488 Other symbolic dysfunctions: Secondary | ICD-10-CM | POA: Diagnosis not present

## 2023-11-07 DIAGNOSIS — R2681 Unsteadiness on feet: Secondary | ICD-10-CM | POA: Diagnosis not present

## 2023-11-07 DIAGNOSIS — M6281 Muscle weakness (generalized): Secondary | ICD-10-CM | POA: Diagnosis not present

## 2023-11-08 DIAGNOSIS — M6281 Muscle weakness (generalized): Secondary | ICD-10-CM | POA: Diagnosis not present

## 2023-11-08 DIAGNOSIS — R498 Other voice and resonance disorders: Secondary | ICD-10-CM | POA: Diagnosis not present

## 2023-11-08 DIAGNOSIS — Z9181 History of falling: Secondary | ICD-10-CM | POA: Diagnosis not present

## 2023-11-08 DIAGNOSIS — R2681 Unsteadiness on feet: Secondary | ICD-10-CM | POA: Diagnosis not present

## 2023-11-08 DIAGNOSIS — R488 Other symbolic dysfunctions: Secondary | ICD-10-CM | POA: Diagnosis not present

## 2023-11-08 DIAGNOSIS — R2689 Other abnormalities of gait and mobility: Secondary | ICD-10-CM | POA: Diagnosis not present

## 2023-11-09 DIAGNOSIS — Z9181 History of falling: Secondary | ICD-10-CM | POA: Diagnosis not present

## 2023-11-09 DIAGNOSIS — R2681 Unsteadiness on feet: Secondary | ICD-10-CM | POA: Diagnosis not present

## 2023-11-09 DIAGNOSIS — M6281 Muscle weakness (generalized): Secondary | ICD-10-CM | POA: Diagnosis not present

## 2023-11-09 DIAGNOSIS — R488 Other symbolic dysfunctions: Secondary | ICD-10-CM | POA: Diagnosis not present

## 2023-11-09 DIAGNOSIS — R2689 Other abnormalities of gait and mobility: Secondary | ICD-10-CM | POA: Diagnosis not present

## 2023-11-09 DIAGNOSIS — R498 Other voice and resonance disorders: Secondary | ICD-10-CM | POA: Diagnosis not present

## 2023-11-12 DIAGNOSIS — R2689 Other abnormalities of gait and mobility: Secondary | ICD-10-CM | POA: Diagnosis not present

## 2023-11-12 DIAGNOSIS — R488 Other symbolic dysfunctions: Secondary | ICD-10-CM | POA: Diagnosis not present

## 2023-11-12 DIAGNOSIS — Z9181 History of falling: Secondary | ICD-10-CM | POA: Diagnosis not present

## 2023-11-12 DIAGNOSIS — R2681 Unsteadiness on feet: Secondary | ICD-10-CM | POA: Diagnosis not present

## 2023-11-12 DIAGNOSIS — R498 Other voice and resonance disorders: Secondary | ICD-10-CM | POA: Diagnosis not present

## 2023-11-12 DIAGNOSIS — M6281 Muscle weakness (generalized): Secondary | ICD-10-CM | POA: Diagnosis not present

## 2023-11-13 DIAGNOSIS — M6281 Muscle weakness (generalized): Secondary | ICD-10-CM | POA: Diagnosis not present

## 2023-11-13 DIAGNOSIS — M7989 Other specified soft tissue disorders: Secondary | ICD-10-CM | POA: Diagnosis not present

## 2023-11-13 DIAGNOSIS — I10 Essential (primary) hypertension: Secondary | ICD-10-CM | POA: Diagnosis not present

## 2023-11-13 DIAGNOSIS — R488 Other symbolic dysfunctions: Secondary | ICD-10-CM | POA: Diagnosis not present

## 2023-11-13 DIAGNOSIS — M545 Low back pain, unspecified: Secondary | ICD-10-CM | POA: Diagnosis not present

## 2023-11-13 DIAGNOSIS — R498 Other voice and resonance disorders: Secondary | ICD-10-CM | POA: Diagnosis not present

## 2023-11-13 DIAGNOSIS — R079 Chest pain, unspecified: Secondary | ICD-10-CM | POA: Diagnosis not present

## 2023-11-13 DIAGNOSIS — R2689 Other abnormalities of gait and mobility: Secondary | ICD-10-CM | POA: Diagnosis not present

## 2023-11-13 DIAGNOSIS — Z9181 History of falling: Secondary | ICD-10-CM | POA: Diagnosis not present

## 2023-11-13 DIAGNOSIS — R2681 Unsteadiness on feet: Secondary | ICD-10-CM | POA: Diagnosis not present

## 2023-11-13 DIAGNOSIS — G8929 Other chronic pain: Secondary | ICD-10-CM | POA: Diagnosis not present

## 2023-11-13 DIAGNOSIS — N390 Urinary tract infection, site not specified: Secondary | ICD-10-CM | POA: Diagnosis not present

## 2023-11-14 DIAGNOSIS — R498 Other voice and resonance disorders: Secondary | ICD-10-CM | POA: Diagnosis not present

## 2023-11-14 DIAGNOSIS — R488 Other symbolic dysfunctions: Secondary | ICD-10-CM | POA: Diagnosis not present

## 2023-11-14 DIAGNOSIS — R2681 Unsteadiness on feet: Secondary | ICD-10-CM | POA: Diagnosis not present

## 2023-11-14 DIAGNOSIS — M6281 Muscle weakness (generalized): Secondary | ICD-10-CM | POA: Diagnosis not present

## 2023-11-14 DIAGNOSIS — Z9181 History of falling: Secondary | ICD-10-CM | POA: Diagnosis not present

## 2023-11-14 DIAGNOSIS — R2689 Other abnormalities of gait and mobility: Secondary | ICD-10-CM | POA: Diagnosis not present

## 2023-11-15 DIAGNOSIS — R600 Localized edema: Secondary | ICD-10-CM | POA: Diagnosis not present

## 2023-11-15 DIAGNOSIS — Z9181 History of falling: Secondary | ICD-10-CM | POA: Diagnosis not present

## 2023-11-15 DIAGNOSIS — R488 Other symbolic dysfunctions: Secondary | ICD-10-CM | POA: Diagnosis not present

## 2023-11-15 DIAGNOSIS — R2689 Other abnormalities of gait and mobility: Secondary | ICD-10-CM | POA: Diagnosis not present

## 2023-11-15 DIAGNOSIS — M6281 Muscle weakness (generalized): Secondary | ICD-10-CM | POA: Diagnosis not present

## 2023-11-15 DIAGNOSIS — R2681 Unsteadiness on feet: Secondary | ICD-10-CM | POA: Diagnosis not present

## 2023-11-15 DIAGNOSIS — R498 Other voice and resonance disorders: Secondary | ICD-10-CM | POA: Diagnosis not present

## 2023-11-16 DIAGNOSIS — R2681 Unsteadiness on feet: Secondary | ICD-10-CM | POA: Diagnosis not present

## 2023-11-16 DIAGNOSIS — R2689 Other abnormalities of gait and mobility: Secondary | ICD-10-CM | POA: Diagnosis not present

## 2023-11-16 DIAGNOSIS — R488 Other symbolic dysfunctions: Secondary | ICD-10-CM | POA: Diagnosis not present

## 2023-11-16 DIAGNOSIS — Z9181 History of falling: Secondary | ICD-10-CM | POA: Diagnosis not present

## 2023-11-16 DIAGNOSIS — M6281 Muscle weakness (generalized): Secondary | ICD-10-CM | POA: Diagnosis not present

## 2023-11-16 DIAGNOSIS — R498 Other voice and resonance disorders: Secondary | ICD-10-CM | POA: Diagnosis not present

## 2023-11-19 DIAGNOSIS — M6281 Muscle weakness (generalized): Secondary | ICD-10-CM | POA: Diagnosis not present

## 2023-11-19 DIAGNOSIS — R498 Other voice and resonance disorders: Secondary | ICD-10-CM | POA: Diagnosis not present

## 2023-11-19 DIAGNOSIS — R2681 Unsteadiness on feet: Secondary | ICD-10-CM | POA: Diagnosis not present

## 2023-11-19 DIAGNOSIS — Z9181 History of falling: Secondary | ICD-10-CM | POA: Diagnosis not present

## 2023-11-19 DIAGNOSIS — R488 Other symbolic dysfunctions: Secondary | ICD-10-CM | POA: Diagnosis not present

## 2023-11-19 DIAGNOSIS — R2689 Other abnormalities of gait and mobility: Secondary | ICD-10-CM | POA: Diagnosis not present

## 2023-11-20 DIAGNOSIS — R2681 Unsteadiness on feet: Secondary | ICD-10-CM | POA: Diagnosis not present

## 2023-11-20 DIAGNOSIS — R488 Other symbolic dysfunctions: Secondary | ICD-10-CM | POA: Diagnosis not present

## 2023-11-20 DIAGNOSIS — R2689 Other abnormalities of gait and mobility: Secondary | ICD-10-CM | POA: Diagnosis not present

## 2023-11-20 DIAGNOSIS — M6281 Muscle weakness (generalized): Secondary | ICD-10-CM | POA: Diagnosis not present

## 2023-11-20 DIAGNOSIS — R498 Other voice and resonance disorders: Secondary | ICD-10-CM | POA: Diagnosis not present

## 2023-11-20 DIAGNOSIS — Z9181 History of falling: Secondary | ICD-10-CM | POA: Diagnosis not present

## 2023-11-21 DIAGNOSIS — R498 Other voice and resonance disorders: Secondary | ICD-10-CM | POA: Diagnosis not present

## 2023-11-21 DIAGNOSIS — Z9181 History of falling: Secondary | ICD-10-CM | POA: Diagnosis not present

## 2023-11-21 DIAGNOSIS — R488 Other symbolic dysfunctions: Secondary | ICD-10-CM | POA: Diagnosis not present

## 2023-11-21 DIAGNOSIS — R2689 Other abnormalities of gait and mobility: Secondary | ICD-10-CM | POA: Diagnosis not present

## 2023-11-21 DIAGNOSIS — M6281 Muscle weakness (generalized): Secondary | ICD-10-CM | POA: Diagnosis not present

## 2023-11-21 DIAGNOSIS — R2681 Unsteadiness on feet: Secondary | ICD-10-CM | POA: Diagnosis not present

## 2023-11-22 DIAGNOSIS — Z9181 History of falling: Secondary | ICD-10-CM | POA: Diagnosis not present

## 2023-11-22 DIAGNOSIS — R488 Other symbolic dysfunctions: Secondary | ICD-10-CM | POA: Diagnosis not present

## 2023-11-22 DIAGNOSIS — R2689 Other abnormalities of gait and mobility: Secondary | ICD-10-CM | POA: Diagnosis not present

## 2023-11-22 DIAGNOSIS — M6281 Muscle weakness (generalized): Secondary | ICD-10-CM | POA: Diagnosis not present

## 2023-11-22 DIAGNOSIS — R2681 Unsteadiness on feet: Secondary | ICD-10-CM | POA: Diagnosis not present

## 2023-11-22 DIAGNOSIS — R498 Other voice and resonance disorders: Secondary | ICD-10-CM | POA: Diagnosis not present

## 2023-11-23 DIAGNOSIS — R2689 Other abnormalities of gait and mobility: Secondary | ICD-10-CM | POA: Diagnosis not present

## 2023-11-23 DIAGNOSIS — Z9181 History of falling: Secondary | ICD-10-CM | POA: Diagnosis not present

## 2023-11-23 DIAGNOSIS — R488 Other symbolic dysfunctions: Secondary | ICD-10-CM | POA: Diagnosis not present

## 2023-11-23 DIAGNOSIS — R498 Other voice and resonance disorders: Secondary | ICD-10-CM | POA: Diagnosis not present

## 2023-11-23 DIAGNOSIS — R2681 Unsteadiness on feet: Secondary | ICD-10-CM | POA: Diagnosis not present

## 2023-11-23 DIAGNOSIS — M6281 Muscle weakness (generalized): Secondary | ICD-10-CM | POA: Diagnosis not present

## 2023-11-26 DIAGNOSIS — R488 Other symbolic dysfunctions: Secondary | ICD-10-CM | POA: Diagnosis not present

## 2023-11-26 DIAGNOSIS — R498 Other voice and resonance disorders: Secondary | ICD-10-CM | POA: Diagnosis not present

## 2023-11-26 DIAGNOSIS — Z9181 History of falling: Secondary | ICD-10-CM | POA: Diagnosis not present

## 2023-11-26 DIAGNOSIS — R2681 Unsteadiness on feet: Secondary | ICD-10-CM | POA: Diagnosis not present

## 2023-11-26 DIAGNOSIS — M6281 Muscle weakness (generalized): Secondary | ICD-10-CM | POA: Diagnosis not present

## 2023-11-26 DIAGNOSIS — R2689 Other abnormalities of gait and mobility: Secondary | ICD-10-CM | POA: Diagnosis not present

## 2023-11-27 DIAGNOSIS — Z9181 History of falling: Secondary | ICD-10-CM | POA: Diagnosis not present

## 2023-11-27 DIAGNOSIS — R498 Other voice and resonance disorders: Secondary | ICD-10-CM | POA: Diagnosis not present

## 2023-11-27 DIAGNOSIS — R488 Other symbolic dysfunctions: Secondary | ICD-10-CM | POA: Diagnosis not present

## 2023-11-27 DIAGNOSIS — R2681 Unsteadiness on feet: Secondary | ICD-10-CM | POA: Diagnosis not present

## 2023-11-27 DIAGNOSIS — M6281 Muscle weakness (generalized): Secondary | ICD-10-CM | POA: Diagnosis not present

## 2023-11-27 DIAGNOSIS — R2689 Other abnormalities of gait and mobility: Secondary | ICD-10-CM | POA: Diagnosis not present

## 2023-11-28 DIAGNOSIS — M6281 Muscle weakness (generalized): Secondary | ICD-10-CM | POA: Diagnosis not present

## 2023-11-28 DIAGNOSIS — R2681 Unsteadiness on feet: Secondary | ICD-10-CM | POA: Diagnosis not present

## 2023-11-28 DIAGNOSIS — R498 Other voice and resonance disorders: Secondary | ICD-10-CM | POA: Diagnosis not present

## 2023-11-28 DIAGNOSIS — Z9181 History of falling: Secondary | ICD-10-CM | POA: Diagnosis not present

## 2023-11-28 DIAGNOSIS — R488 Other symbolic dysfunctions: Secondary | ICD-10-CM | POA: Diagnosis not present

## 2023-11-28 DIAGNOSIS — R2689 Other abnormalities of gait and mobility: Secondary | ICD-10-CM | POA: Diagnosis not present

## 2023-11-29 DIAGNOSIS — R498 Other voice and resonance disorders: Secondary | ICD-10-CM | POA: Diagnosis not present

## 2023-11-29 DIAGNOSIS — R2689 Other abnormalities of gait and mobility: Secondary | ICD-10-CM | POA: Diagnosis not present

## 2023-11-29 DIAGNOSIS — R2681 Unsteadiness on feet: Secondary | ICD-10-CM | POA: Diagnosis not present

## 2023-11-29 DIAGNOSIS — M6281 Muscle weakness (generalized): Secondary | ICD-10-CM | POA: Diagnosis not present

## 2023-11-29 DIAGNOSIS — Z9181 History of falling: Secondary | ICD-10-CM | POA: Diagnosis not present

## 2023-11-29 DIAGNOSIS — R488 Other symbolic dysfunctions: Secondary | ICD-10-CM | POA: Diagnosis not present

## 2023-11-30 DIAGNOSIS — M6281 Muscle weakness (generalized): Secondary | ICD-10-CM | POA: Diagnosis not present

## 2023-11-30 DIAGNOSIS — R498 Other voice and resonance disorders: Secondary | ICD-10-CM | POA: Diagnosis not present

## 2023-11-30 DIAGNOSIS — Z9181 History of falling: Secondary | ICD-10-CM | POA: Diagnosis not present

## 2023-11-30 DIAGNOSIS — R488 Other symbolic dysfunctions: Secondary | ICD-10-CM | POA: Diagnosis not present

## 2023-11-30 DIAGNOSIS — R2689 Other abnormalities of gait and mobility: Secondary | ICD-10-CM | POA: Diagnosis not present

## 2023-11-30 DIAGNOSIS — G43909 Migraine, unspecified, not intractable, without status migrainosus: Secondary | ICD-10-CM | POA: Diagnosis not present

## 2023-11-30 DIAGNOSIS — J449 Chronic obstructive pulmonary disease, unspecified: Secondary | ICD-10-CM | POA: Diagnosis not present

## 2023-11-30 DIAGNOSIS — R2681 Unsteadiness on feet: Secondary | ICD-10-CM | POA: Diagnosis not present

## 2023-12-03 DIAGNOSIS — R2689 Other abnormalities of gait and mobility: Secondary | ICD-10-CM | POA: Diagnosis not present

## 2023-12-03 DIAGNOSIS — R2681 Unsteadiness on feet: Secondary | ICD-10-CM | POA: Diagnosis not present

## 2023-12-03 DIAGNOSIS — R488 Other symbolic dysfunctions: Secondary | ICD-10-CM | POA: Diagnosis not present

## 2023-12-03 DIAGNOSIS — M6281 Muscle weakness (generalized): Secondary | ICD-10-CM | POA: Diagnosis not present

## 2023-12-03 DIAGNOSIS — Z9181 History of falling: Secondary | ICD-10-CM | POA: Diagnosis not present

## 2023-12-03 DIAGNOSIS — R498 Other voice and resonance disorders: Secondary | ICD-10-CM | POA: Diagnosis not present

## 2023-12-03 DIAGNOSIS — M158 Other polyosteoarthritis: Secondary | ICD-10-CM | POA: Diagnosis not present

## 2023-12-04 DIAGNOSIS — Z9181 History of falling: Secondary | ICD-10-CM | POA: Diagnosis not present

## 2023-12-04 DIAGNOSIS — M6281 Muscle weakness (generalized): Secondary | ICD-10-CM | POA: Diagnosis not present

## 2023-12-04 DIAGNOSIS — N390 Urinary tract infection, site not specified: Secondary | ICD-10-CM | POA: Diagnosis not present

## 2023-12-04 DIAGNOSIS — Z87440 Personal history of urinary (tract) infections: Secondary | ICD-10-CM | POA: Diagnosis not present

## 2023-12-05 DIAGNOSIS — M158 Other polyosteoarthritis: Secondary | ICD-10-CM | POA: Diagnosis not present

## 2023-12-05 DIAGNOSIS — R2681 Unsteadiness on feet: Secondary | ICD-10-CM | POA: Diagnosis not present

## 2023-12-05 DIAGNOSIS — R498 Other voice and resonance disorders: Secondary | ICD-10-CM | POA: Diagnosis not present

## 2023-12-05 DIAGNOSIS — M6281 Muscle weakness (generalized): Secondary | ICD-10-CM | POA: Diagnosis not present

## 2023-12-05 DIAGNOSIS — R2689 Other abnormalities of gait and mobility: Secondary | ICD-10-CM | POA: Diagnosis not present

## 2023-12-05 DIAGNOSIS — Z9181 History of falling: Secondary | ICD-10-CM | POA: Diagnosis not present

## 2023-12-05 DIAGNOSIS — R488 Other symbolic dysfunctions: Secondary | ICD-10-CM | POA: Diagnosis not present

## 2023-12-06 DIAGNOSIS — R2681 Unsteadiness on feet: Secondary | ICD-10-CM | POA: Diagnosis not present

## 2023-12-06 DIAGNOSIS — M158 Other polyosteoarthritis: Secondary | ICD-10-CM | POA: Diagnosis not present

## 2023-12-06 DIAGNOSIS — Z9181 History of falling: Secondary | ICD-10-CM | POA: Diagnosis not present

## 2023-12-06 DIAGNOSIS — R2689 Other abnormalities of gait and mobility: Secondary | ICD-10-CM | POA: Diagnosis not present

## 2023-12-06 DIAGNOSIS — M6281 Muscle weakness (generalized): Secondary | ICD-10-CM | POA: Diagnosis not present

## 2023-12-07 DIAGNOSIS — R498 Other voice and resonance disorders: Secondary | ICD-10-CM | POA: Diagnosis not present

## 2023-12-07 DIAGNOSIS — M6281 Muscle weakness (generalized): Secondary | ICD-10-CM | POA: Diagnosis not present

## 2023-12-07 DIAGNOSIS — R488 Other symbolic dysfunctions: Secondary | ICD-10-CM | POA: Diagnosis not present

## 2023-12-07 DIAGNOSIS — Z9181 History of falling: Secondary | ICD-10-CM | POA: Diagnosis not present

## 2023-12-10 DIAGNOSIS — R498 Other voice and resonance disorders: Secondary | ICD-10-CM | POA: Diagnosis not present

## 2023-12-10 DIAGNOSIS — J9809 Other diseases of bronchus, not elsewhere classified: Secondary | ICD-10-CM | POA: Diagnosis not present

## 2023-12-10 DIAGNOSIS — J069 Acute upper respiratory infection, unspecified: Secondary | ICD-10-CM | POA: Diagnosis not present

## 2023-12-10 DIAGNOSIS — Z1331 Encounter for screening for depression: Secondary | ICD-10-CM | POA: Diagnosis not present

## 2023-12-10 DIAGNOSIS — R488 Other symbolic dysfunctions: Secondary | ICD-10-CM | POA: Diagnosis not present

## 2023-12-10 DIAGNOSIS — I129 Hypertensive chronic kidney disease with stage 1 through stage 4 chronic kidney disease, or unspecified chronic kidney disease: Secondary | ICD-10-CM | POA: Diagnosis not present

## 2023-12-10 DIAGNOSIS — Z9181 History of falling: Secondary | ICD-10-CM | POA: Diagnosis not present

## 2023-12-10 DIAGNOSIS — E039 Hypothyroidism, unspecified: Secondary | ICD-10-CM | POA: Diagnosis not present

## 2023-12-10 DIAGNOSIS — M6281 Muscle weakness (generalized): Secondary | ICD-10-CM | POA: Diagnosis not present

## 2023-12-10 DIAGNOSIS — F320 Major depressive disorder, single episode, mild: Secondary | ICD-10-CM | POA: Diagnosis not present

## 2023-12-10 DIAGNOSIS — N182 Chronic kidney disease, stage 2 (mild): Secondary | ICD-10-CM | POA: Diagnosis not present

## 2023-12-10 DIAGNOSIS — N390 Urinary tract infection, site not specified: Secondary | ICD-10-CM | POA: Diagnosis not present

## 2023-12-11 DIAGNOSIS — M158 Other polyosteoarthritis: Secondary | ICD-10-CM | POA: Diagnosis not present

## 2023-12-11 DIAGNOSIS — M6281 Muscle weakness (generalized): Secondary | ICD-10-CM | POA: Diagnosis not present

## 2023-12-11 DIAGNOSIS — Z9181 History of falling: Secondary | ICD-10-CM | POA: Diagnosis not present

## 2023-12-11 DIAGNOSIS — R2689 Other abnormalities of gait and mobility: Secondary | ICD-10-CM | POA: Diagnosis not present

## 2023-12-11 DIAGNOSIS — R2681 Unsteadiness on feet: Secondary | ICD-10-CM | POA: Diagnosis not present

## 2023-12-12 DIAGNOSIS — R2689 Other abnormalities of gait and mobility: Secondary | ICD-10-CM | POA: Diagnosis not present

## 2023-12-12 DIAGNOSIS — Z9181 History of falling: Secondary | ICD-10-CM | POA: Diagnosis not present

## 2023-12-12 DIAGNOSIS — M6281 Muscle weakness (generalized): Secondary | ICD-10-CM | POA: Diagnosis not present

## 2023-12-12 DIAGNOSIS — R488 Other symbolic dysfunctions: Secondary | ICD-10-CM | POA: Diagnosis not present

## 2023-12-12 DIAGNOSIS — M158 Other polyosteoarthritis: Secondary | ICD-10-CM | POA: Diagnosis not present

## 2023-12-12 DIAGNOSIS — R2681 Unsteadiness on feet: Secondary | ICD-10-CM | POA: Diagnosis not present

## 2023-12-12 DIAGNOSIS — R498 Other voice and resonance disorders: Secondary | ICD-10-CM | POA: Diagnosis not present

## 2023-12-13 DIAGNOSIS — R488 Other symbolic dysfunctions: Secondary | ICD-10-CM | POA: Diagnosis not present

## 2023-12-13 DIAGNOSIS — Z9181 History of falling: Secondary | ICD-10-CM | POA: Diagnosis not present

## 2023-12-13 DIAGNOSIS — R498 Other voice and resonance disorders: Secondary | ICD-10-CM | POA: Diagnosis not present

## 2023-12-13 DIAGNOSIS — M6281 Muscle weakness (generalized): Secondary | ICD-10-CM | POA: Diagnosis not present

## 2023-12-14 DIAGNOSIS — Z9181 History of falling: Secondary | ICD-10-CM | POA: Diagnosis not present

## 2023-12-14 DIAGNOSIS — M6281 Muscle weakness (generalized): Secondary | ICD-10-CM | POA: Diagnosis not present

## 2023-12-14 DIAGNOSIS — R2689 Other abnormalities of gait and mobility: Secondary | ICD-10-CM | POA: Diagnosis not present

## 2023-12-14 DIAGNOSIS — M158 Other polyosteoarthritis: Secondary | ICD-10-CM | POA: Diagnosis not present

## 2023-12-14 DIAGNOSIS — R2681 Unsteadiness on feet: Secondary | ICD-10-CM | POA: Diagnosis not present

## 2023-12-17 DIAGNOSIS — R498 Other voice and resonance disorders: Secondary | ICD-10-CM | POA: Diagnosis not present

## 2023-12-17 DIAGNOSIS — M6281 Muscle weakness (generalized): Secondary | ICD-10-CM | POA: Diagnosis not present

## 2023-12-17 DIAGNOSIS — Z9181 History of falling: Secondary | ICD-10-CM | POA: Diagnosis not present

## 2023-12-17 DIAGNOSIS — R488 Other symbolic dysfunctions: Secondary | ICD-10-CM | POA: Diagnosis not present

## 2023-12-18 DIAGNOSIS — Z9181 History of falling: Secondary | ICD-10-CM | POA: Diagnosis not present

## 2023-12-18 DIAGNOSIS — R2689 Other abnormalities of gait and mobility: Secondary | ICD-10-CM | POA: Diagnosis not present

## 2023-12-18 DIAGNOSIS — I10 Essential (primary) hypertension: Secondary | ICD-10-CM | POA: Diagnosis not present

## 2023-12-18 DIAGNOSIS — R488 Other symbolic dysfunctions: Secondary | ICD-10-CM | POA: Diagnosis not present

## 2023-12-18 DIAGNOSIS — R2681 Unsteadiness on feet: Secondary | ICD-10-CM | POA: Diagnosis not present

## 2023-12-18 DIAGNOSIS — J449 Chronic obstructive pulmonary disease, unspecified: Secondary | ICD-10-CM | POA: Diagnosis not present

## 2023-12-18 DIAGNOSIS — M158 Other polyosteoarthritis: Secondary | ICD-10-CM | POA: Diagnosis not present

## 2023-12-18 DIAGNOSIS — M6281 Muscle weakness (generalized): Secondary | ICD-10-CM | POA: Diagnosis not present

## 2023-12-18 DIAGNOSIS — R498 Other voice and resonance disorders: Secondary | ICD-10-CM | POA: Diagnosis not present

## 2023-12-19 DIAGNOSIS — R498 Other voice and resonance disorders: Secondary | ICD-10-CM | POA: Diagnosis not present

## 2023-12-19 DIAGNOSIS — Z9181 History of falling: Secondary | ICD-10-CM | POA: Diagnosis not present

## 2023-12-19 DIAGNOSIS — R488 Other symbolic dysfunctions: Secondary | ICD-10-CM | POA: Diagnosis not present

## 2023-12-19 DIAGNOSIS — R2689 Other abnormalities of gait and mobility: Secondary | ICD-10-CM | POA: Diagnosis not present

## 2023-12-19 DIAGNOSIS — M158 Other polyosteoarthritis: Secondary | ICD-10-CM | POA: Diagnosis not present

## 2023-12-19 DIAGNOSIS — R2681 Unsteadiness on feet: Secondary | ICD-10-CM | POA: Diagnosis not present

## 2023-12-19 DIAGNOSIS — M6281 Muscle weakness (generalized): Secondary | ICD-10-CM | POA: Diagnosis not present

## 2023-12-20 DIAGNOSIS — Z9181 History of falling: Secondary | ICD-10-CM | POA: Diagnosis not present

## 2023-12-20 DIAGNOSIS — M6281 Muscle weakness (generalized): Secondary | ICD-10-CM | POA: Diagnosis not present

## 2023-12-21 DIAGNOSIS — M6281 Muscle weakness (generalized): Secondary | ICD-10-CM | POA: Diagnosis not present

## 2023-12-21 DIAGNOSIS — R2689 Other abnormalities of gait and mobility: Secondary | ICD-10-CM | POA: Diagnosis not present

## 2023-12-21 DIAGNOSIS — M158 Other polyosteoarthritis: Secondary | ICD-10-CM | POA: Diagnosis not present

## 2023-12-21 DIAGNOSIS — R2681 Unsteadiness on feet: Secondary | ICD-10-CM | POA: Diagnosis not present

## 2023-12-21 DIAGNOSIS — Z9181 History of falling: Secondary | ICD-10-CM | POA: Diagnosis not present

## 2023-12-23 DIAGNOSIS — R498 Other voice and resonance disorders: Secondary | ICD-10-CM | POA: Diagnosis not present

## 2023-12-23 DIAGNOSIS — R488 Other symbolic dysfunctions: Secondary | ICD-10-CM | POA: Diagnosis not present

## 2023-12-24 DIAGNOSIS — R488 Other symbolic dysfunctions: Secondary | ICD-10-CM | POA: Diagnosis not present

## 2023-12-24 DIAGNOSIS — R498 Other voice and resonance disorders: Secondary | ICD-10-CM | POA: Diagnosis not present

## 2023-12-24 DIAGNOSIS — M6281 Muscle weakness (generalized): Secondary | ICD-10-CM | POA: Diagnosis not present

## 2023-12-24 DIAGNOSIS — Z9181 History of falling: Secondary | ICD-10-CM | POA: Diagnosis not present

## 2023-12-25 DIAGNOSIS — M6281 Muscle weakness (generalized): Secondary | ICD-10-CM | POA: Diagnosis not present

## 2023-12-25 DIAGNOSIS — Z9181 History of falling: Secondary | ICD-10-CM | POA: Diagnosis not present

## 2023-12-25 DIAGNOSIS — R488 Other symbolic dysfunctions: Secondary | ICD-10-CM | POA: Diagnosis not present

## 2023-12-25 DIAGNOSIS — R498 Other voice and resonance disorders: Secondary | ICD-10-CM | POA: Diagnosis not present

## 2023-12-26 DIAGNOSIS — M6281 Muscle weakness (generalized): Secondary | ICD-10-CM | POA: Diagnosis not present

## 2023-12-26 DIAGNOSIS — Z9181 History of falling: Secondary | ICD-10-CM | POA: Diagnosis not present

## 2023-12-27 DIAGNOSIS — M6281 Muscle weakness (generalized): Secondary | ICD-10-CM | POA: Diagnosis not present

## 2023-12-27 DIAGNOSIS — R2689 Other abnormalities of gait and mobility: Secondary | ICD-10-CM | POA: Diagnosis not present

## 2023-12-27 DIAGNOSIS — Z9181 History of falling: Secondary | ICD-10-CM | POA: Diagnosis not present

## 2023-12-27 DIAGNOSIS — M158 Other polyosteoarthritis: Secondary | ICD-10-CM | POA: Diagnosis not present

## 2023-12-27 DIAGNOSIS — R2681 Unsteadiness on feet: Secondary | ICD-10-CM | POA: Diagnosis not present

## 2023-12-28 DIAGNOSIS — M158 Other polyosteoarthritis: Secondary | ICD-10-CM | POA: Diagnosis not present

## 2023-12-28 DIAGNOSIS — Z9181 History of falling: Secondary | ICD-10-CM | POA: Diagnosis not present

## 2023-12-28 DIAGNOSIS — M6281 Muscle weakness (generalized): Secondary | ICD-10-CM | POA: Diagnosis not present

## 2023-12-28 DIAGNOSIS — R2689 Other abnormalities of gait and mobility: Secondary | ICD-10-CM | POA: Diagnosis not present

## 2023-12-28 DIAGNOSIS — R2681 Unsteadiness on feet: Secondary | ICD-10-CM | POA: Diagnosis not present

## 2023-12-31 DIAGNOSIS — Z9181 History of falling: Secondary | ICD-10-CM | POA: Diagnosis not present

## 2023-12-31 DIAGNOSIS — M158 Other polyosteoarthritis: Secondary | ICD-10-CM | POA: Diagnosis not present

## 2023-12-31 DIAGNOSIS — R2689 Other abnormalities of gait and mobility: Secondary | ICD-10-CM | POA: Diagnosis not present

## 2023-12-31 DIAGNOSIS — M6281 Muscle weakness (generalized): Secondary | ICD-10-CM | POA: Diagnosis not present

## 2023-12-31 DIAGNOSIS — R2681 Unsteadiness on feet: Secondary | ICD-10-CM | POA: Diagnosis not present

## 2024-01-01 DIAGNOSIS — Z9181 History of falling: Secondary | ICD-10-CM | POA: Diagnosis not present

## 2024-01-01 DIAGNOSIS — R498 Other voice and resonance disorders: Secondary | ICD-10-CM | POA: Diagnosis not present

## 2024-01-01 DIAGNOSIS — R488 Other symbolic dysfunctions: Secondary | ICD-10-CM | POA: Diagnosis not present

## 2024-01-01 DIAGNOSIS — M6281 Muscle weakness (generalized): Secondary | ICD-10-CM | POA: Diagnosis not present

## 2024-01-02 DIAGNOSIS — Z9181 History of falling: Secondary | ICD-10-CM | POA: Diagnosis not present

## 2024-01-02 DIAGNOSIS — R498 Other voice and resonance disorders: Secondary | ICD-10-CM | POA: Diagnosis not present

## 2024-01-02 DIAGNOSIS — R2681 Unsteadiness on feet: Secondary | ICD-10-CM | POA: Diagnosis not present

## 2024-01-02 DIAGNOSIS — M158 Other polyosteoarthritis: Secondary | ICD-10-CM | POA: Diagnosis not present

## 2024-01-02 DIAGNOSIS — R2689 Other abnormalities of gait and mobility: Secondary | ICD-10-CM | POA: Diagnosis not present

## 2024-01-02 DIAGNOSIS — M6281 Muscle weakness (generalized): Secondary | ICD-10-CM | POA: Diagnosis not present

## 2024-01-02 DIAGNOSIS — R488 Other symbolic dysfunctions: Secondary | ICD-10-CM | POA: Diagnosis not present

## 2024-01-03 DIAGNOSIS — R488 Other symbolic dysfunctions: Secondary | ICD-10-CM | POA: Diagnosis not present

## 2024-01-03 DIAGNOSIS — R498 Other voice and resonance disorders: Secondary | ICD-10-CM | POA: Diagnosis not present

## 2024-01-03 DIAGNOSIS — Z9181 History of falling: Secondary | ICD-10-CM | POA: Diagnosis not present

## 2024-01-03 DIAGNOSIS — M6281 Muscle weakness (generalized): Secondary | ICD-10-CM | POA: Diagnosis not present

## 2024-01-07 DIAGNOSIS — G894 Chronic pain syndrome: Secondary | ICD-10-CM | POA: Diagnosis not present

## 2024-01-07 DIAGNOSIS — R4181 Age-related cognitive decline: Secondary | ICD-10-CM | POA: Diagnosis not present

## 2024-01-07 DIAGNOSIS — N390 Urinary tract infection, site not specified: Secondary | ICD-10-CM | POA: Diagnosis not present

## 2024-01-07 DIAGNOSIS — J449 Chronic obstructive pulmonary disease, unspecified: Secondary | ICD-10-CM | POA: Diagnosis not present

## 2024-01-07 DIAGNOSIS — J069 Acute upper respiratory infection, unspecified: Secondary | ICD-10-CM | POA: Diagnosis not present

## 2024-01-08 DIAGNOSIS — M6281 Muscle weakness (generalized): Secondary | ICD-10-CM | POA: Diagnosis not present

## 2024-01-08 DIAGNOSIS — R488 Other symbolic dysfunctions: Secondary | ICD-10-CM | POA: Diagnosis not present

## 2024-01-08 DIAGNOSIS — R498 Other voice and resonance disorders: Secondary | ICD-10-CM | POA: Diagnosis not present

## 2024-01-08 DIAGNOSIS — Z9181 History of falling: Secondary | ICD-10-CM | POA: Diagnosis not present

## 2024-01-09 DIAGNOSIS — Z9181 History of falling: Secondary | ICD-10-CM | POA: Diagnosis not present

## 2024-01-09 DIAGNOSIS — M158 Other polyosteoarthritis: Secondary | ICD-10-CM | POA: Diagnosis not present

## 2024-01-09 DIAGNOSIS — R498 Other voice and resonance disorders: Secondary | ICD-10-CM | POA: Diagnosis not present

## 2024-01-09 DIAGNOSIS — R488 Other symbolic dysfunctions: Secondary | ICD-10-CM | POA: Diagnosis not present

## 2024-01-09 DIAGNOSIS — R2689 Other abnormalities of gait and mobility: Secondary | ICD-10-CM | POA: Diagnosis not present

## 2024-01-09 DIAGNOSIS — R2681 Unsteadiness on feet: Secondary | ICD-10-CM | POA: Diagnosis not present

## 2024-01-10 DIAGNOSIS — R488 Other symbolic dysfunctions: Secondary | ICD-10-CM | POA: Diagnosis not present

## 2024-01-10 DIAGNOSIS — Z9181 History of falling: Secondary | ICD-10-CM | POA: Diagnosis not present

## 2024-01-10 DIAGNOSIS — M158 Other polyosteoarthritis: Secondary | ICD-10-CM | POA: Diagnosis not present

## 2024-01-10 DIAGNOSIS — R2689 Other abnormalities of gait and mobility: Secondary | ICD-10-CM | POA: Diagnosis not present

## 2024-01-10 DIAGNOSIS — R2681 Unsteadiness on feet: Secondary | ICD-10-CM | POA: Diagnosis not present

## 2024-01-10 DIAGNOSIS — M6281 Muscle weakness (generalized): Secondary | ICD-10-CM | POA: Diagnosis not present

## 2024-01-10 DIAGNOSIS — R498 Other voice and resonance disorders: Secondary | ICD-10-CM | POA: Diagnosis not present

## 2024-01-11 DIAGNOSIS — M158 Other polyosteoarthritis: Secondary | ICD-10-CM | POA: Diagnosis not present

## 2024-01-11 DIAGNOSIS — R2681 Unsteadiness on feet: Secondary | ICD-10-CM | POA: Diagnosis not present

## 2024-01-11 DIAGNOSIS — M6281 Muscle weakness (generalized): Secondary | ICD-10-CM | POA: Diagnosis not present

## 2024-01-11 DIAGNOSIS — R2689 Other abnormalities of gait and mobility: Secondary | ICD-10-CM | POA: Diagnosis not present

## 2024-01-11 DIAGNOSIS — Z9181 History of falling: Secondary | ICD-10-CM | POA: Diagnosis not present

## 2024-01-14 DIAGNOSIS — R488 Other symbolic dysfunctions: Secondary | ICD-10-CM | POA: Diagnosis not present

## 2024-01-14 DIAGNOSIS — R2689 Other abnormalities of gait and mobility: Secondary | ICD-10-CM | POA: Diagnosis not present

## 2024-01-14 DIAGNOSIS — J449 Chronic obstructive pulmonary disease, unspecified: Secondary | ICD-10-CM | POA: Diagnosis not present

## 2024-01-14 DIAGNOSIS — I10 Essential (primary) hypertension: Secondary | ICD-10-CM | POA: Diagnosis not present

## 2024-01-14 DIAGNOSIS — M6281 Muscle weakness (generalized): Secondary | ICD-10-CM | POA: Diagnosis not present

## 2024-01-14 DIAGNOSIS — R498 Other voice and resonance disorders: Secondary | ICD-10-CM | POA: Diagnosis not present

## 2024-01-14 DIAGNOSIS — R2681 Unsteadiness on feet: Secondary | ICD-10-CM | POA: Diagnosis not present

## 2024-01-14 DIAGNOSIS — Z9181 History of falling: Secondary | ICD-10-CM | POA: Diagnosis not present

## 2024-01-14 DIAGNOSIS — M158 Other polyosteoarthritis: Secondary | ICD-10-CM | POA: Diagnosis not present

## 2024-01-15 DIAGNOSIS — Z9181 History of falling: Secondary | ICD-10-CM | POA: Diagnosis not present

## 2024-01-15 DIAGNOSIS — M158 Other polyosteoarthritis: Secondary | ICD-10-CM | POA: Diagnosis not present

## 2024-01-15 DIAGNOSIS — R2689 Other abnormalities of gait and mobility: Secondary | ICD-10-CM | POA: Diagnosis not present

## 2024-01-15 DIAGNOSIS — R2681 Unsteadiness on feet: Secondary | ICD-10-CM | POA: Diagnosis not present

## 2024-01-15 DIAGNOSIS — M6281 Muscle weakness (generalized): Secondary | ICD-10-CM | POA: Diagnosis not present

## 2024-01-16 DIAGNOSIS — R488 Other symbolic dysfunctions: Secondary | ICD-10-CM | POA: Diagnosis not present

## 2024-01-16 DIAGNOSIS — R2681 Unsteadiness on feet: Secondary | ICD-10-CM | POA: Diagnosis not present

## 2024-01-16 DIAGNOSIS — R498 Other voice and resonance disorders: Secondary | ICD-10-CM | POA: Diagnosis not present

## 2024-01-16 DIAGNOSIS — M158 Other polyosteoarthritis: Secondary | ICD-10-CM | POA: Diagnosis not present

## 2024-01-16 DIAGNOSIS — Z9181 History of falling: Secondary | ICD-10-CM | POA: Diagnosis not present

## 2024-01-16 DIAGNOSIS — M6281 Muscle weakness (generalized): Secondary | ICD-10-CM | POA: Diagnosis not present

## 2024-01-16 DIAGNOSIS — R2689 Other abnormalities of gait and mobility: Secondary | ICD-10-CM | POA: Diagnosis not present

## 2024-01-17 DIAGNOSIS — M6281 Muscle weakness (generalized): Secondary | ICD-10-CM | POA: Diagnosis not present

## 2024-01-17 DIAGNOSIS — Z9181 History of falling: Secondary | ICD-10-CM | POA: Diagnosis not present

## 2024-01-18 DIAGNOSIS — Z9181 History of falling: Secondary | ICD-10-CM | POA: Diagnosis not present

## 2024-01-18 DIAGNOSIS — M6281 Muscle weakness (generalized): Secondary | ICD-10-CM | POA: Diagnosis not present

## 2024-01-18 DIAGNOSIS — R498 Other voice and resonance disorders: Secondary | ICD-10-CM | POA: Diagnosis not present

## 2024-01-18 DIAGNOSIS — M158 Other polyosteoarthritis: Secondary | ICD-10-CM | POA: Diagnosis not present

## 2024-01-18 DIAGNOSIS — R2689 Other abnormalities of gait and mobility: Secondary | ICD-10-CM | POA: Diagnosis not present

## 2024-01-18 DIAGNOSIS — R2681 Unsteadiness on feet: Secondary | ICD-10-CM | POA: Diagnosis not present

## 2024-01-18 DIAGNOSIS — R488 Other symbolic dysfunctions: Secondary | ICD-10-CM | POA: Diagnosis not present

## 2024-01-21 DIAGNOSIS — R488 Other symbolic dysfunctions: Secondary | ICD-10-CM | POA: Diagnosis not present

## 2024-01-21 DIAGNOSIS — Z91199 Patient's noncompliance with other medical treatment and regimen due to unspecified reason: Secondary | ICD-10-CM | POA: Diagnosis not present

## 2024-01-21 DIAGNOSIS — M158 Other polyosteoarthritis: Secondary | ICD-10-CM | POA: Diagnosis not present

## 2024-01-21 DIAGNOSIS — H6990 Unspecified Eustachian tube disorder, unspecified ear: Secondary | ICD-10-CM | POA: Diagnosis not present

## 2024-01-21 DIAGNOSIS — Z9181 History of falling: Secondary | ICD-10-CM | POA: Diagnosis not present

## 2024-01-21 DIAGNOSIS — R2689 Other abnormalities of gait and mobility: Secondary | ICD-10-CM | POA: Diagnosis not present

## 2024-01-21 DIAGNOSIS — R2681 Unsteadiness on feet: Secondary | ICD-10-CM | POA: Diagnosis not present

## 2024-01-21 DIAGNOSIS — R498 Other voice and resonance disorders: Secondary | ICD-10-CM | POA: Diagnosis not present

## 2024-01-21 DIAGNOSIS — K0889 Other specified disorders of teeth and supporting structures: Secondary | ICD-10-CM | POA: Diagnosis not present

## 2024-01-21 DIAGNOSIS — M6281 Muscle weakness (generalized): Secondary | ICD-10-CM | POA: Diagnosis not present

## 2024-01-21 DIAGNOSIS — M549 Dorsalgia, unspecified: Secondary | ICD-10-CM | POA: Diagnosis not present

## 2024-01-22 DIAGNOSIS — M6281 Muscle weakness (generalized): Secondary | ICD-10-CM | POA: Diagnosis not present

## 2024-01-22 DIAGNOSIS — Z9181 History of falling: Secondary | ICD-10-CM | POA: Diagnosis not present

## 2024-01-22 DIAGNOSIS — R488 Other symbolic dysfunctions: Secondary | ICD-10-CM | POA: Diagnosis not present

## 2024-01-22 DIAGNOSIS — R498 Other voice and resonance disorders: Secondary | ICD-10-CM | POA: Diagnosis not present

## 2024-01-23 DIAGNOSIS — R2681 Unsteadiness on feet: Secondary | ICD-10-CM | POA: Diagnosis not present

## 2024-01-23 DIAGNOSIS — R2689 Other abnormalities of gait and mobility: Secondary | ICD-10-CM | POA: Diagnosis not present

## 2024-01-23 DIAGNOSIS — M6281 Muscle weakness (generalized): Secondary | ICD-10-CM | POA: Diagnosis not present

## 2024-01-23 DIAGNOSIS — M158 Other polyosteoarthritis: Secondary | ICD-10-CM | POA: Diagnosis not present

## 2024-01-23 DIAGNOSIS — Z9181 History of falling: Secondary | ICD-10-CM | POA: Diagnosis not present

## 2024-01-24 DIAGNOSIS — R488 Other symbolic dysfunctions: Secondary | ICD-10-CM | POA: Diagnosis not present

## 2024-01-24 DIAGNOSIS — M6281 Muscle weakness (generalized): Secondary | ICD-10-CM | POA: Diagnosis not present

## 2024-01-24 DIAGNOSIS — Z9181 History of falling: Secondary | ICD-10-CM | POA: Diagnosis not present

## 2024-01-24 DIAGNOSIS — R498 Other voice and resonance disorders: Secondary | ICD-10-CM | POA: Diagnosis not present

## 2024-01-25 DIAGNOSIS — M6281 Muscle weakness (generalized): Secondary | ICD-10-CM | POA: Diagnosis not present

## 2024-01-25 DIAGNOSIS — M158 Other polyosteoarthritis: Secondary | ICD-10-CM | POA: Diagnosis not present

## 2024-01-25 DIAGNOSIS — R2681 Unsteadiness on feet: Secondary | ICD-10-CM | POA: Diagnosis not present

## 2024-01-25 DIAGNOSIS — R2689 Other abnormalities of gait and mobility: Secondary | ICD-10-CM | POA: Diagnosis not present

## 2024-01-25 DIAGNOSIS — Z9181 History of falling: Secondary | ICD-10-CM | POA: Diagnosis not present

## 2024-01-28 DIAGNOSIS — M158 Other polyosteoarthritis: Secondary | ICD-10-CM | POA: Diagnosis not present

## 2024-01-28 DIAGNOSIS — R2689 Other abnormalities of gait and mobility: Secondary | ICD-10-CM | POA: Diagnosis not present

## 2024-01-28 DIAGNOSIS — R2681 Unsteadiness on feet: Secondary | ICD-10-CM | POA: Diagnosis not present

## 2024-01-28 DIAGNOSIS — Z9181 History of falling: Secondary | ICD-10-CM | POA: Diagnosis not present

## 2024-01-29 DIAGNOSIS — Z9181 History of falling: Secondary | ICD-10-CM | POA: Diagnosis not present

## 2024-01-29 DIAGNOSIS — M6281 Muscle weakness (generalized): Secondary | ICD-10-CM | POA: Diagnosis not present

## 2024-01-30 DIAGNOSIS — Z9181 History of falling: Secondary | ICD-10-CM | POA: Diagnosis not present

## 2024-01-30 DIAGNOSIS — M6281 Muscle weakness (generalized): Secondary | ICD-10-CM | POA: Diagnosis not present

## 2024-01-30 DIAGNOSIS — M158 Other polyosteoarthritis: Secondary | ICD-10-CM | POA: Diagnosis not present

## 2024-01-30 DIAGNOSIS — R498 Other voice and resonance disorders: Secondary | ICD-10-CM | POA: Diagnosis not present

## 2024-01-30 DIAGNOSIS — R2689 Other abnormalities of gait and mobility: Secondary | ICD-10-CM | POA: Diagnosis not present

## 2024-01-30 DIAGNOSIS — R488 Other symbolic dysfunctions: Secondary | ICD-10-CM | POA: Diagnosis not present

## 2024-01-30 DIAGNOSIS — R2681 Unsteadiness on feet: Secondary | ICD-10-CM | POA: Diagnosis not present

## 2024-01-31 DIAGNOSIS — R2681 Unsteadiness on feet: Secondary | ICD-10-CM | POA: Diagnosis not present

## 2024-01-31 DIAGNOSIS — R488 Other symbolic dysfunctions: Secondary | ICD-10-CM | POA: Diagnosis not present

## 2024-01-31 DIAGNOSIS — R498 Other voice and resonance disorders: Secondary | ICD-10-CM | POA: Diagnosis not present

## 2024-01-31 DIAGNOSIS — Z9181 History of falling: Secondary | ICD-10-CM | POA: Diagnosis not present

## 2024-01-31 DIAGNOSIS — R2689 Other abnormalities of gait and mobility: Secondary | ICD-10-CM | POA: Diagnosis not present

## 2024-01-31 DIAGNOSIS — M6281 Muscle weakness (generalized): Secondary | ICD-10-CM | POA: Diagnosis not present

## 2024-01-31 DIAGNOSIS — M158 Other polyosteoarthritis: Secondary | ICD-10-CM | POA: Diagnosis not present

## 2024-02-01 DIAGNOSIS — Z9181 History of falling: Secondary | ICD-10-CM | POA: Diagnosis not present

## 2024-02-01 DIAGNOSIS — R2689 Other abnormalities of gait and mobility: Secondary | ICD-10-CM | POA: Diagnosis not present

## 2024-02-01 DIAGNOSIS — R2681 Unsteadiness on feet: Secondary | ICD-10-CM | POA: Diagnosis not present

## 2024-02-01 DIAGNOSIS — M158 Other polyosteoarthritis: Secondary | ICD-10-CM | POA: Diagnosis not present

## 2024-02-01 DIAGNOSIS — R498 Other voice and resonance disorders: Secondary | ICD-10-CM | POA: Diagnosis not present

## 2024-02-01 DIAGNOSIS — R488 Other symbolic dysfunctions: Secondary | ICD-10-CM | POA: Diagnosis not present

## 2024-02-01 DIAGNOSIS — M6281 Muscle weakness (generalized): Secondary | ICD-10-CM | POA: Diagnosis not present

## 2024-02-04 DIAGNOSIS — R2689 Other abnormalities of gait and mobility: Secondary | ICD-10-CM | POA: Diagnosis not present

## 2024-02-04 DIAGNOSIS — M158 Other polyosteoarthritis: Secondary | ICD-10-CM | POA: Diagnosis not present

## 2024-02-04 DIAGNOSIS — M6281 Muscle weakness (generalized): Secondary | ICD-10-CM | POA: Diagnosis not present

## 2024-02-04 DIAGNOSIS — Z9181 History of falling: Secondary | ICD-10-CM | POA: Diagnosis not present

## 2024-02-04 DIAGNOSIS — R488 Other symbolic dysfunctions: Secondary | ICD-10-CM | POA: Diagnosis not present

## 2024-02-04 DIAGNOSIS — R2681 Unsteadiness on feet: Secondary | ICD-10-CM | POA: Diagnosis not present

## 2024-02-04 DIAGNOSIS — R498 Other voice and resonance disorders: Secondary | ICD-10-CM | POA: Diagnosis not present

## 2024-02-05 DIAGNOSIS — R2689 Other abnormalities of gait and mobility: Secondary | ICD-10-CM | POA: Diagnosis not present

## 2024-02-05 DIAGNOSIS — M158 Other polyosteoarthritis: Secondary | ICD-10-CM | POA: Diagnosis not present

## 2024-02-05 DIAGNOSIS — M6281 Muscle weakness (generalized): Secondary | ICD-10-CM | POA: Diagnosis not present

## 2024-02-05 DIAGNOSIS — Z9181 History of falling: Secondary | ICD-10-CM | POA: Diagnosis not present

## 2024-02-05 DIAGNOSIS — R2681 Unsteadiness on feet: Secondary | ICD-10-CM | POA: Diagnosis not present

## 2024-02-06 DIAGNOSIS — G8929 Other chronic pain: Secondary | ICD-10-CM | POA: Diagnosis not present

## 2024-02-06 DIAGNOSIS — Z9181 History of falling: Secondary | ICD-10-CM | POA: Diagnosis not present

## 2024-02-06 DIAGNOSIS — H6990 Unspecified Eustachian tube disorder, unspecified ear: Secondary | ICD-10-CM | POA: Diagnosis not present

## 2024-02-06 DIAGNOSIS — G43909 Migraine, unspecified, not intractable, without status migrainosus: Secondary | ICD-10-CM | POA: Diagnosis not present

## 2024-02-06 DIAGNOSIS — J449 Chronic obstructive pulmonary disease, unspecified: Secondary | ICD-10-CM | POA: Diagnosis not present

## 2024-02-06 DIAGNOSIS — Z87440 Personal history of urinary (tract) infections: Secondary | ICD-10-CM | POA: Diagnosis not present

## 2024-02-06 DIAGNOSIS — R2689 Other abnormalities of gait and mobility: Secondary | ICD-10-CM | POA: Diagnosis not present

## 2024-02-06 DIAGNOSIS — R2681 Unsteadiness on feet: Secondary | ICD-10-CM | POA: Diagnosis not present

## 2024-02-06 DIAGNOSIS — M545 Low back pain, unspecified: Secondary | ICD-10-CM | POA: Diagnosis not present

## 2024-02-06 DIAGNOSIS — I10 Essential (primary) hypertension: Secondary | ICD-10-CM | POA: Diagnosis not present

## 2024-02-06 DIAGNOSIS — M158 Other polyosteoarthritis: Secondary | ICD-10-CM | POA: Diagnosis not present

## 2024-02-06 DIAGNOSIS — Z91199 Patient's noncompliance with other medical treatment and regimen due to unspecified reason: Secondary | ICD-10-CM | POA: Diagnosis not present

## 2024-02-06 DIAGNOSIS — K0889 Other specified disorders of teeth and supporting structures: Secondary | ICD-10-CM | POA: Diagnosis not present

## 2024-02-06 DIAGNOSIS — G894 Chronic pain syndrome: Secondary | ICD-10-CM | POA: Diagnosis not present

## 2024-02-07 DIAGNOSIS — R498 Other voice and resonance disorders: Secondary | ICD-10-CM | POA: Diagnosis not present

## 2024-02-07 DIAGNOSIS — M158 Other polyosteoarthritis: Secondary | ICD-10-CM | POA: Diagnosis not present

## 2024-02-07 DIAGNOSIS — R2689 Other abnormalities of gait and mobility: Secondary | ICD-10-CM | POA: Diagnosis not present

## 2024-02-07 DIAGNOSIS — R488 Other symbolic dysfunctions: Secondary | ICD-10-CM | POA: Diagnosis not present

## 2024-02-07 DIAGNOSIS — R2681 Unsteadiness on feet: Secondary | ICD-10-CM | POA: Diagnosis not present

## 2024-02-07 DIAGNOSIS — M6281 Muscle weakness (generalized): Secondary | ICD-10-CM | POA: Diagnosis not present

## 2024-02-07 DIAGNOSIS — Z9181 History of falling: Secondary | ICD-10-CM | POA: Diagnosis not present

## 2024-02-08 DIAGNOSIS — R498 Other voice and resonance disorders: Secondary | ICD-10-CM | POA: Diagnosis not present

## 2024-02-08 DIAGNOSIS — R488 Other symbolic dysfunctions: Secondary | ICD-10-CM | POA: Diagnosis not present

## 2024-02-10 DIAGNOSIS — R498 Other voice and resonance disorders: Secondary | ICD-10-CM | POA: Diagnosis not present

## 2024-02-10 DIAGNOSIS — R488 Other symbolic dysfunctions: Secondary | ICD-10-CM | POA: Diagnosis not present

## 2024-02-11 DIAGNOSIS — M6281 Muscle weakness (generalized): Secondary | ICD-10-CM | POA: Diagnosis not present

## 2024-02-11 DIAGNOSIS — R498 Other voice and resonance disorders: Secondary | ICD-10-CM | POA: Diagnosis not present

## 2024-02-11 DIAGNOSIS — Z9181 History of falling: Secondary | ICD-10-CM | POA: Diagnosis not present

## 2024-02-11 DIAGNOSIS — R488 Other symbolic dysfunctions: Secondary | ICD-10-CM | POA: Diagnosis not present

## 2024-02-12 DIAGNOSIS — M6281 Muscle weakness (generalized): Secondary | ICD-10-CM | POA: Diagnosis not present

## 2024-02-12 DIAGNOSIS — Z9181 History of falling: Secondary | ICD-10-CM | POA: Diagnosis not present

## 2024-02-12 DIAGNOSIS — R2681 Unsteadiness on feet: Secondary | ICD-10-CM | POA: Diagnosis not present

## 2024-02-12 DIAGNOSIS — R2689 Other abnormalities of gait and mobility: Secondary | ICD-10-CM | POA: Diagnosis not present

## 2024-02-12 DIAGNOSIS — M158 Other polyosteoarthritis: Secondary | ICD-10-CM | POA: Diagnosis not present

## 2024-02-13 DIAGNOSIS — Z9181 History of falling: Secondary | ICD-10-CM | POA: Diagnosis not present

## 2024-02-13 DIAGNOSIS — R2681 Unsteadiness on feet: Secondary | ICD-10-CM | POA: Diagnosis not present

## 2024-02-13 DIAGNOSIS — R2689 Other abnormalities of gait and mobility: Secondary | ICD-10-CM | POA: Diagnosis not present

## 2024-02-13 DIAGNOSIS — M158 Other polyosteoarthritis: Secondary | ICD-10-CM | POA: Diagnosis not present

## 2024-02-14 DIAGNOSIS — M158 Other polyosteoarthritis: Secondary | ICD-10-CM | POA: Diagnosis not present

## 2024-02-14 DIAGNOSIS — R2681 Unsteadiness on feet: Secondary | ICD-10-CM | POA: Diagnosis not present

## 2024-02-14 DIAGNOSIS — R498 Other voice and resonance disorders: Secondary | ICD-10-CM | POA: Diagnosis not present

## 2024-02-14 DIAGNOSIS — Z9181 History of falling: Secondary | ICD-10-CM | POA: Diagnosis not present

## 2024-02-14 DIAGNOSIS — R2689 Other abnormalities of gait and mobility: Secondary | ICD-10-CM | POA: Diagnosis not present

## 2024-02-14 DIAGNOSIS — R488 Other symbolic dysfunctions: Secondary | ICD-10-CM | POA: Diagnosis not present

## 2024-02-15 DIAGNOSIS — Z9181 History of falling: Secondary | ICD-10-CM | POA: Diagnosis not present

## 2024-02-15 DIAGNOSIS — M6281 Muscle weakness (generalized): Secondary | ICD-10-CM | POA: Diagnosis not present

## 2024-02-17 DIAGNOSIS — M6281 Muscle weakness (generalized): Secondary | ICD-10-CM | POA: Diagnosis not present

## 2024-02-17 DIAGNOSIS — Z9181 History of falling: Secondary | ICD-10-CM | POA: Diagnosis not present

## 2024-02-18 DIAGNOSIS — M6281 Muscle weakness (generalized): Secondary | ICD-10-CM | POA: Diagnosis not present

## 2024-02-18 DIAGNOSIS — Z9181 History of falling: Secondary | ICD-10-CM | POA: Diagnosis not present

## 2024-02-19 DIAGNOSIS — M6281 Muscle weakness (generalized): Secondary | ICD-10-CM | POA: Diagnosis not present

## 2024-02-19 DIAGNOSIS — M158 Other polyosteoarthritis: Secondary | ICD-10-CM | POA: Diagnosis not present

## 2024-02-19 DIAGNOSIS — R2681 Unsteadiness on feet: Secondary | ICD-10-CM | POA: Diagnosis not present

## 2024-02-19 DIAGNOSIS — Z9181 History of falling: Secondary | ICD-10-CM | POA: Diagnosis not present

## 2024-02-19 DIAGNOSIS — R2689 Other abnormalities of gait and mobility: Secondary | ICD-10-CM | POA: Diagnosis not present

## 2024-02-20 DIAGNOSIS — R2689 Other abnormalities of gait and mobility: Secondary | ICD-10-CM | POA: Diagnosis not present

## 2024-02-20 DIAGNOSIS — M6281 Muscle weakness (generalized): Secondary | ICD-10-CM | POA: Diagnosis not present

## 2024-02-20 DIAGNOSIS — M158 Other polyosteoarthritis: Secondary | ICD-10-CM | POA: Diagnosis not present

## 2024-02-20 DIAGNOSIS — R2681 Unsteadiness on feet: Secondary | ICD-10-CM | POA: Diagnosis not present

## 2024-02-20 DIAGNOSIS — Z9181 History of falling: Secondary | ICD-10-CM | POA: Diagnosis not present

## 2024-02-22 DIAGNOSIS — R2689 Other abnormalities of gait and mobility: Secondary | ICD-10-CM | POA: Diagnosis not present

## 2024-02-22 DIAGNOSIS — M158 Other polyosteoarthritis: Secondary | ICD-10-CM | POA: Diagnosis not present

## 2024-02-22 DIAGNOSIS — Z9181 History of falling: Secondary | ICD-10-CM | POA: Diagnosis not present

## 2024-02-22 DIAGNOSIS — R2681 Unsteadiness on feet: Secondary | ICD-10-CM | POA: Diagnosis not present

## 2024-02-25 DIAGNOSIS — M158 Other polyosteoarthritis: Secondary | ICD-10-CM | POA: Diagnosis not present

## 2024-02-25 DIAGNOSIS — J449 Chronic obstructive pulmonary disease, unspecified: Secondary | ICD-10-CM | POA: Diagnosis not present

## 2024-02-25 DIAGNOSIS — R2689 Other abnormalities of gait and mobility: Secondary | ICD-10-CM | POA: Diagnosis not present

## 2024-02-25 DIAGNOSIS — Z9181 History of falling: Secondary | ICD-10-CM | POA: Diagnosis not present

## 2024-02-25 DIAGNOSIS — N182 Chronic kidney disease, stage 2 (mild): Secondary | ICD-10-CM | POA: Diagnosis not present

## 2024-02-25 DIAGNOSIS — R300 Dysuria: Secondary | ICD-10-CM | POA: Diagnosis not present

## 2024-02-25 DIAGNOSIS — R2681 Unsteadiness on feet: Secondary | ICD-10-CM | POA: Diagnosis not present

## 2024-02-25 DIAGNOSIS — I129 Hypertensive chronic kidney disease with stage 1 through stage 4 chronic kidney disease, or unspecified chronic kidney disease: Secondary | ICD-10-CM | POA: Diagnosis not present

## 2024-02-25 DIAGNOSIS — F411 Generalized anxiety disorder: Secondary | ICD-10-CM | POA: Diagnosis not present

## 2024-02-25 DIAGNOSIS — H6990 Unspecified Eustachian tube disorder, unspecified ear: Secondary | ICD-10-CM | POA: Diagnosis not present

## 2024-02-26 DIAGNOSIS — Z9181 History of falling: Secondary | ICD-10-CM | POA: Diagnosis not present

## 2024-02-26 DIAGNOSIS — R300 Dysuria: Secondary | ICD-10-CM | POA: Diagnosis not present

## 2024-02-26 DIAGNOSIS — M158 Other polyosteoarthritis: Secondary | ICD-10-CM | POA: Diagnosis not present

## 2024-02-26 DIAGNOSIS — R488 Other symbolic dysfunctions: Secondary | ICD-10-CM | POA: Diagnosis not present

## 2024-02-26 DIAGNOSIS — R2689 Other abnormalities of gait and mobility: Secondary | ICD-10-CM | POA: Diagnosis not present

## 2024-02-26 DIAGNOSIS — R2681 Unsteadiness on feet: Secondary | ICD-10-CM | POA: Diagnosis not present

## 2024-02-26 DIAGNOSIS — M6281 Muscle weakness (generalized): Secondary | ICD-10-CM | POA: Diagnosis not present

## 2024-02-26 DIAGNOSIS — R498 Other voice and resonance disorders: Secondary | ICD-10-CM | POA: Diagnosis not present

## 2024-02-27 DIAGNOSIS — M6281 Muscle weakness (generalized): Secondary | ICD-10-CM | POA: Diagnosis not present

## 2024-02-27 DIAGNOSIS — R2681 Unsteadiness on feet: Secondary | ICD-10-CM | POA: Diagnosis not present

## 2024-02-27 DIAGNOSIS — Z9181 History of falling: Secondary | ICD-10-CM | POA: Diagnosis not present

## 2024-02-27 DIAGNOSIS — R2689 Other abnormalities of gait and mobility: Secondary | ICD-10-CM | POA: Diagnosis not present

## 2024-02-27 DIAGNOSIS — M158 Other polyosteoarthritis: Secondary | ICD-10-CM | POA: Diagnosis not present

## 2024-02-28 DIAGNOSIS — M158 Other polyosteoarthritis: Secondary | ICD-10-CM | POA: Diagnosis not present

## 2024-02-28 DIAGNOSIS — R2689 Other abnormalities of gait and mobility: Secondary | ICD-10-CM | POA: Diagnosis not present

## 2024-02-28 DIAGNOSIS — R2681 Unsteadiness on feet: Secondary | ICD-10-CM | POA: Diagnosis not present

## 2024-02-28 DIAGNOSIS — R498 Other voice and resonance disorders: Secondary | ICD-10-CM | POA: Diagnosis not present

## 2024-02-28 DIAGNOSIS — M6281 Muscle weakness (generalized): Secondary | ICD-10-CM | POA: Diagnosis not present

## 2024-02-28 DIAGNOSIS — R488 Other symbolic dysfunctions: Secondary | ICD-10-CM | POA: Diagnosis not present

## 2024-02-28 DIAGNOSIS — Z9181 History of falling: Secondary | ICD-10-CM | POA: Diagnosis not present

## 2024-02-29 DIAGNOSIS — M6281 Muscle weakness (generalized): Secondary | ICD-10-CM | POA: Diagnosis not present

## 2024-02-29 DIAGNOSIS — R488 Other symbolic dysfunctions: Secondary | ICD-10-CM | POA: Diagnosis not present

## 2024-02-29 DIAGNOSIS — R498 Other voice and resonance disorders: Secondary | ICD-10-CM | POA: Diagnosis not present

## 2024-02-29 DIAGNOSIS — Z9181 History of falling: Secondary | ICD-10-CM | POA: Diagnosis not present

## 2024-03-03 DIAGNOSIS — Z9181 History of falling: Secondary | ICD-10-CM | POA: Diagnosis not present

## 2024-03-03 DIAGNOSIS — R498 Other voice and resonance disorders: Secondary | ICD-10-CM | POA: Diagnosis not present

## 2024-03-03 DIAGNOSIS — R488 Other symbolic dysfunctions: Secondary | ICD-10-CM | POA: Diagnosis not present

## 2024-03-03 DIAGNOSIS — M6281 Muscle weakness (generalized): Secondary | ICD-10-CM | POA: Diagnosis not present

## 2024-03-04 DIAGNOSIS — M6281 Muscle weakness (generalized): Secondary | ICD-10-CM | POA: Diagnosis not present

## 2024-03-04 DIAGNOSIS — Z9181 History of falling: Secondary | ICD-10-CM | POA: Diagnosis not present

## 2024-03-04 DIAGNOSIS — R498 Other voice and resonance disorders: Secondary | ICD-10-CM | POA: Diagnosis not present

## 2024-03-04 DIAGNOSIS — R488 Other symbolic dysfunctions: Secondary | ICD-10-CM | POA: Diagnosis not present

## 2024-03-05 DIAGNOSIS — M6281 Muscle weakness (generalized): Secondary | ICD-10-CM | POA: Diagnosis not present

## 2024-03-05 DIAGNOSIS — R2681 Unsteadiness on feet: Secondary | ICD-10-CM | POA: Diagnosis not present

## 2024-03-05 DIAGNOSIS — R2689 Other abnormalities of gait and mobility: Secondary | ICD-10-CM | POA: Diagnosis not present

## 2024-03-05 DIAGNOSIS — M158 Other polyosteoarthritis: Secondary | ICD-10-CM | POA: Diagnosis not present

## 2024-03-05 DIAGNOSIS — Z9181 History of falling: Secondary | ICD-10-CM | POA: Diagnosis not present

## 2024-03-06 DIAGNOSIS — M158 Other polyosteoarthritis: Secondary | ICD-10-CM | POA: Diagnosis not present

## 2024-03-06 DIAGNOSIS — R2681 Unsteadiness on feet: Secondary | ICD-10-CM | POA: Diagnosis not present

## 2024-03-06 DIAGNOSIS — R488 Other symbolic dysfunctions: Secondary | ICD-10-CM | POA: Diagnosis not present

## 2024-03-06 DIAGNOSIS — R2689 Other abnormalities of gait and mobility: Secondary | ICD-10-CM | POA: Diagnosis not present

## 2024-03-06 DIAGNOSIS — R498 Other voice and resonance disorders: Secondary | ICD-10-CM | POA: Diagnosis not present

## 2024-03-06 DIAGNOSIS — Z9181 History of falling: Secondary | ICD-10-CM | POA: Diagnosis not present

## 2024-03-07 DIAGNOSIS — Z9181 History of falling: Secondary | ICD-10-CM | POA: Diagnosis not present

## 2024-03-07 DIAGNOSIS — M6281 Muscle weakness (generalized): Secondary | ICD-10-CM | POA: Diagnosis not present

## 2024-03-07 DIAGNOSIS — R2681 Unsteadiness on feet: Secondary | ICD-10-CM | POA: Diagnosis not present

## 2024-03-07 DIAGNOSIS — M158 Other polyosteoarthritis: Secondary | ICD-10-CM | POA: Diagnosis not present

## 2024-03-07 DIAGNOSIS — R2689 Other abnormalities of gait and mobility: Secondary | ICD-10-CM | POA: Diagnosis not present

## 2024-03-10 DIAGNOSIS — R488 Other symbolic dysfunctions: Secondary | ICD-10-CM | POA: Diagnosis not present

## 2024-03-10 DIAGNOSIS — R2681 Unsteadiness on feet: Secondary | ICD-10-CM | POA: Diagnosis not present

## 2024-03-10 DIAGNOSIS — N182 Chronic kidney disease, stage 2 (mild): Secondary | ICD-10-CM | POA: Diagnosis not present

## 2024-03-10 DIAGNOSIS — R2689 Other abnormalities of gait and mobility: Secondary | ICD-10-CM | POA: Diagnosis not present

## 2024-03-10 DIAGNOSIS — Z9181 History of falling: Secondary | ICD-10-CM | POA: Diagnosis not present

## 2024-03-10 DIAGNOSIS — M6281 Muscle weakness (generalized): Secondary | ICD-10-CM | POA: Diagnosis not present

## 2024-03-10 DIAGNOSIS — I129 Hypertensive chronic kidney disease with stage 1 through stage 4 chronic kidney disease, or unspecified chronic kidney disease: Secondary | ICD-10-CM | POA: Diagnosis not present

## 2024-03-10 DIAGNOSIS — M158 Other polyosteoarthritis: Secondary | ICD-10-CM | POA: Diagnosis not present

## 2024-03-10 DIAGNOSIS — G2581 Restless legs syndrome: Secondary | ICD-10-CM | POA: Diagnosis not present

## 2024-03-10 DIAGNOSIS — R498 Other voice and resonance disorders: Secondary | ICD-10-CM | POA: Diagnosis not present

## 2024-03-11 DIAGNOSIS — R2681 Unsteadiness on feet: Secondary | ICD-10-CM | POA: Diagnosis not present

## 2024-03-11 DIAGNOSIS — Z9181 History of falling: Secondary | ICD-10-CM | POA: Diagnosis not present

## 2024-03-11 DIAGNOSIS — R498 Other voice and resonance disorders: Secondary | ICD-10-CM | POA: Diagnosis not present

## 2024-03-11 DIAGNOSIS — M158 Other polyosteoarthritis: Secondary | ICD-10-CM | POA: Diagnosis not present

## 2024-03-11 DIAGNOSIS — R488 Other symbolic dysfunctions: Secondary | ICD-10-CM | POA: Diagnosis not present

## 2024-03-11 DIAGNOSIS — R2689 Other abnormalities of gait and mobility: Secondary | ICD-10-CM | POA: Diagnosis not present

## 2024-03-11 DIAGNOSIS — M6281 Muscle weakness (generalized): Secondary | ICD-10-CM | POA: Diagnosis not present

## 2024-03-13 DIAGNOSIS — R488 Other symbolic dysfunctions: Secondary | ICD-10-CM | POA: Diagnosis not present

## 2024-03-13 DIAGNOSIS — R498 Other voice and resonance disorders: Secondary | ICD-10-CM | POA: Diagnosis not present

## 2024-03-14 DIAGNOSIS — Z9181 History of falling: Secondary | ICD-10-CM | POA: Diagnosis not present

## 2024-03-14 DIAGNOSIS — M6281 Muscle weakness (generalized): Secondary | ICD-10-CM | POA: Diagnosis not present

## 2024-03-16 DIAGNOSIS — R498 Other voice and resonance disorders: Secondary | ICD-10-CM | POA: Diagnosis not present

## 2024-03-16 DIAGNOSIS — R488 Other symbolic dysfunctions: Secondary | ICD-10-CM | POA: Diagnosis not present

## 2024-03-17 DIAGNOSIS — M158 Other polyosteoarthritis: Secondary | ICD-10-CM | POA: Diagnosis not present

## 2024-03-17 DIAGNOSIS — R2681 Unsteadiness on feet: Secondary | ICD-10-CM | POA: Diagnosis not present

## 2024-03-17 DIAGNOSIS — R498 Other voice and resonance disorders: Secondary | ICD-10-CM | POA: Diagnosis not present

## 2024-03-17 DIAGNOSIS — R488 Other symbolic dysfunctions: Secondary | ICD-10-CM | POA: Diagnosis not present

## 2024-03-17 DIAGNOSIS — Z9181 History of falling: Secondary | ICD-10-CM | POA: Diagnosis not present

## 2024-03-17 DIAGNOSIS — R2689 Other abnormalities of gait and mobility: Secondary | ICD-10-CM | POA: Diagnosis not present

## 2024-03-17 DIAGNOSIS — M6281 Muscle weakness (generalized): Secondary | ICD-10-CM | POA: Diagnosis not present

## 2024-03-18 DIAGNOSIS — M158 Other polyosteoarthritis: Secondary | ICD-10-CM | POA: Diagnosis not present

## 2024-03-18 DIAGNOSIS — R2689 Other abnormalities of gait and mobility: Secondary | ICD-10-CM | POA: Diagnosis not present

## 2024-03-18 DIAGNOSIS — Z9181 History of falling: Secondary | ICD-10-CM | POA: Diagnosis not present

## 2024-03-18 DIAGNOSIS — R2681 Unsteadiness on feet: Secondary | ICD-10-CM | POA: Diagnosis not present

## 2024-03-19 DIAGNOSIS — R2689 Other abnormalities of gait and mobility: Secondary | ICD-10-CM | POA: Diagnosis not present

## 2024-03-19 DIAGNOSIS — Z9181 History of falling: Secondary | ICD-10-CM | POA: Diagnosis not present

## 2024-03-19 DIAGNOSIS — M158 Other polyosteoarthritis: Secondary | ICD-10-CM | POA: Diagnosis not present

## 2024-03-19 DIAGNOSIS — R2681 Unsteadiness on feet: Secondary | ICD-10-CM | POA: Diagnosis not present

## 2024-03-20 DIAGNOSIS — Z9181 History of falling: Secondary | ICD-10-CM | POA: Diagnosis not present

## 2024-03-20 DIAGNOSIS — M6281 Muscle weakness (generalized): Secondary | ICD-10-CM | POA: Diagnosis not present

## 2024-03-21 DIAGNOSIS — M6281 Muscle weakness (generalized): Secondary | ICD-10-CM | POA: Diagnosis not present

## 2024-03-21 DIAGNOSIS — Z9181 History of falling: Secondary | ICD-10-CM | POA: Diagnosis not present

## 2024-03-24 DIAGNOSIS — Z9181 History of falling: Secondary | ICD-10-CM | POA: Diagnosis not present

## 2024-03-24 DIAGNOSIS — M6281 Muscle weakness (generalized): Secondary | ICD-10-CM | POA: Diagnosis not present

## 2024-03-24 DIAGNOSIS — R2681 Unsteadiness on feet: Secondary | ICD-10-CM | POA: Diagnosis not present

## 2024-03-24 DIAGNOSIS — R2689 Other abnormalities of gait and mobility: Secondary | ICD-10-CM | POA: Diagnosis not present

## 2024-03-24 DIAGNOSIS — M158 Other polyosteoarthritis: Secondary | ICD-10-CM | POA: Diagnosis not present

## 2024-03-25 DIAGNOSIS — M6281 Muscle weakness (generalized): Secondary | ICD-10-CM | POA: Diagnosis not present

## 2024-03-25 DIAGNOSIS — R498 Other voice and resonance disorders: Secondary | ICD-10-CM | POA: Diagnosis not present

## 2024-03-25 DIAGNOSIS — R488 Other symbolic dysfunctions: Secondary | ICD-10-CM | POA: Diagnosis not present

## 2024-03-25 DIAGNOSIS — Z9181 History of falling: Secondary | ICD-10-CM | POA: Diagnosis not present

## 2024-03-26 DIAGNOSIS — R2681 Unsteadiness on feet: Secondary | ICD-10-CM | POA: Diagnosis not present

## 2024-03-26 DIAGNOSIS — M6281 Muscle weakness (generalized): Secondary | ICD-10-CM | POA: Diagnosis not present

## 2024-03-26 DIAGNOSIS — M158 Other polyosteoarthritis: Secondary | ICD-10-CM | POA: Diagnosis not present

## 2024-03-26 DIAGNOSIS — Z9181 History of falling: Secondary | ICD-10-CM | POA: Diagnosis not present

## 2024-03-26 DIAGNOSIS — R2689 Other abnormalities of gait and mobility: Secondary | ICD-10-CM | POA: Diagnosis not present

## 2024-03-26 DIAGNOSIS — R300 Dysuria: Secondary | ICD-10-CM | POA: Diagnosis not present

## 2024-03-27 DIAGNOSIS — I10 Essential (primary) hypertension: Secondary | ICD-10-CM | POA: Diagnosis not present

## 2024-03-27 DIAGNOSIS — M6281 Muscle weakness (generalized): Secondary | ICD-10-CM | POA: Diagnosis not present

## 2024-03-27 DIAGNOSIS — J449 Chronic obstructive pulmonary disease, unspecified: Secondary | ICD-10-CM | POA: Diagnosis not present

## 2024-03-27 DIAGNOSIS — Z9181 History of falling: Secondary | ICD-10-CM | POA: Diagnosis not present

## 2024-03-28 DIAGNOSIS — R2681 Unsteadiness on feet: Secondary | ICD-10-CM | POA: Diagnosis not present

## 2024-03-28 DIAGNOSIS — Z9181 History of falling: Secondary | ICD-10-CM | POA: Diagnosis not present

## 2024-03-28 DIAGNOSIS — M158 Other polyosteoarthritis: Secondary | ICD-10-CM | POA: Diagnosis not present

## 2024-03-28 DIAGNOSIS — R2689 Other abnormalities of gait and mobility: Secondary | ICD-10-CM | POA: Diagnosis not present

## 2024-03-29 DIAGNOSIS — R488 Other symbolic dysfunctions: Secondary | ICD-10-CM | POA: Diagnosis not present

## 2024-03-29 DIAGNOSIS — R498 Other voice and resonance disorders: Secondary | ICD-10-CM | POA: Diagnosis not present

## 2024-03-31 DIAGNOSIS — R488 Other symbolic dysfunctions: Secondary | ICD-10-CM | POA: Diagnosis not present

## 2024-03-31 DIAGNOSIS — R498 Other voice and resonance disorders: Secondary | ICD-10-CM | POA: Diagnosis not present

## 2024-03-31 DIAGNOSIS — M6281 Muscle weakness (generalized): Secondary | ICD-10-CM | POA: Diagnosis not present

## 2024-03-31 DIAGNOSIS — Z9181 History of falling: Secondary | ICD-10-CM | POA: Diagnosis not present

## 2024-03-31 DIAGNOSIS — M158 Other polyosteoarthritis: Secondary | ICD-10-CM | POA: Diagnosis not present

## 2024-03-31 DIAGNOSIS — R2681 Unsteadiness on feet: Secondary | ICD-10-CM | POA: Diagnosis not present

## 2024-03-31 DIAGNOSIS — R2689 Other abnormalities of gait and mobility: Secondary | ICD-10-CM | POA: Diagnosis not present

## 2024-04-02 DIAGNOSIS — Z9181 History of falling: Secondary | ICD-10-CM | POA: Diagnosis not present

## 2024-04-02 DIAGNOSIS — R2681 Unsteadiness on feet: Secondary | ICD-10-CM | POA: Diagnosis not present

## 2024-04-02 DIAGNOSIS — M158 Other polyosteoarthritis: Secondary | ICD-10-CM | POA: Diagnosis not present

## 2024-04-02 DIAGNOSIS — R2689 Other abnormalities of gait and mobility: Secondary | ICD-10-CM | POA: Diagnosis not present

## 2024-04-03 DIAGNOSIS — M158 Other polyosteoarthritis: Secondary | ICD-10-CM | POA: Diagnosis not present

## 2024-04-03 DIAGNOSIS — Z9181 History of falling: Secondary | ICD-10-CM | POA: Diagnosis not present

## 2024-04-03 DIAGNOSIS — R2689 Other abnormalities of gait and mobility: Secondary | ICD-10-CM | POA: Diagnosis not present

## 2024-04-03 DIAGNOSIS — R2681 Unsteadiness on feet: Secondary | ICD-10-CM | POA: Diagnosis not present

## 2024-04-04 DIAGNOSIS — Z9181 History of falling: Secondary | ICD-10-CM | POA: Diagnosis not present

## 2024-04-04 DIAGNOSIS — M6281 Muscle weakness (generalized): Secondary | ICD-10-CM | POA: Diagnosis not present

## 2024-04-07 DIAGNOSIS — M158 Other polyosteoarthritis: Secondary | ICD-10-CM | POA: Diagnosis not present

## 2024-04-07 DIAGNOSIS — Z9181 History of falling: Secondary | ICD-10-CM | POA: Diagnosis not present

## 2024-04-07 DIAGNOSIS — R2689 Other abnormalities of gait and mobility: Secondary | ICD-10-CM | POA: Diagnosis not present

## 2024-04-07 DIAGNOSIS — R498 Other voice and resonance disorders: Secondary | ICD-10-CM | POA: Diagnosis not present

## 2024-04-07 DIAGNOSIS — R488 Other symbolic dysfunctions: Secondary | ICD-10-CM | POA: Diagnosis not present

## 2024-04-07 DIAGNOSIS — R2681 Unsteadiness on feet: Secondary | ICD-10-CM | POA: Diagnosis not present

## 2024-04-08 DIAGNOSIS — Z9181 History of falling: Secondary | ICD-10-CM | POA: Diagnosis not present

## 2024-04-08 DIAGNOSIS — R498 Other voice and resonance disorders: Secondary | ICD-10-CM | POA: Diagnosis not present

## 2024-04-08 DIAGNOSIS — M6281 Muscle weakness (generalized): Secondary | ICD-10-CM | POA: Diagnosis not present

## 2024-04-08 DIAGNOSIS — R488 Other symbolic dysfunctions: Secondary | ICD-10-CM | POA: Diagnosis not present

## 2024-04-09 DIAGNOSIS — R2681 Unsteadiness on feet: Secondary | ICD-10-CM | POA: Diagnosis not present

## 2024-04-09 DIAGNOSIS — R498 Other voice and resonance disorders: Secondary | ICD-10-CM | POA: Diagnosis not present

## 2024-04-09 DIAGNOSIS — R2689 Other abnormalities of gait and mobility: Secondary | ICD-10-CM | POA: Diagnosis not present

## 2024-04-09 DIAGNOSIS — M6281 Muscle weakness (generalized): Secondary | ICD-10-CM | POA: Diagnosis not present

## 2024-04-09 DIAGNOSIS — Z9181 History of falling: Secondary | ICD-10-CM | POA: Diagnosis not present

## 2024-04-09 DIAGNOSIS — M158 Other polyosteoarthritis: Secondary | ICD-10-CM | POA: Diagnosis not present

## 2024-04-09 DIAGNOSIS — R488 Other symbolic dysfunctions: Secondary | ICD-10-CM | POA: Diagnosis not present

## 2024-04-10 DIAGNOSIS — Z9181 History of falling: Secondary | ICD-10-CM | POA: Diagnosis not present

## 2024-04-10 DIAGNOSIS — M6281 Muscle weakness (generalized): Secondary | ICD-10-CM | POA: Diagnosis not present

## 2024-04-11 DIAGNOSIS — R2681 Unsteadiness on feet: Secondary | ICD-10-CM | POA: Diagnosis not present

## 2024-04-11 DIAGNOSIS — M6281 Muscle weakness (generalized): Secondary | ICD-10-CM | POA: Diagnosis not present

## 2024-04-11 DIAGNOSIS — R2689 Other abnormalities of gait and mobility: Secondary | ICD-10-CM | POA: Diagnosis not present

## 2024-04-11 DIAGNOSIS — Z9181 History of falling: Secondary | ICD-10-CM | POA: Diagnosis not present

## 2024-04-11 DIAGNOSIS — M158 Other polyosteoarthritis: Secondary | ICD-10-CM | POA: Diagnosis not present

## 2024-04-14 DIAGNOSIS — M158 Other polyosteoarthritis: Secondary | ICD-10-CM | POA: Diagnosis not present

## 2024-04-14 DIAGNOSIS — Z9181 History of falling: Secondary | ICD-10-CM | POA: Diagnosis not present

## 2024-04-14 DIAGNOSIS — M6281 Muscle weakness (generalized): Secondary | ICD-10-CM | POA: Diagnosis not present

## 2024-04-14 DIAGNOSIS — R2689 Other abnormalities of gait and mobility: Secondary | ICD-10-CM | POA: Diagnosis not present

## 2024-04-14 DIAGNOSIS — R2681 Unsteadiness on feet: Secondary | ICD-10-CM | POA: Diagnosis not present

## 2024-04-14 DIAGNOSIS — R488 Other symbolic dysfunctions: Secondary | ICD-10-CM | POA: Diagnosis not present

## 2024-04-14 DIAGNOSIS — R498 Other voice and resonance disorders: Secondary | ICD-10-CM | POA: Diagnosis not present

## 2024-04-15 DIAGNOSIS — R498 Other voice and resonance disorders: Secondary | ICD-10-CM | POA: Diagnosis not present

## 2024-04-15 DIAGNOSIS — R488 Other symbolic dysfunctions: Secondary | ICD-10-CM | POA: Diagnosis not present

## 2024-04-16 DIAGNOSIS — R488 Other symbolic dysfunctions: Secondary | ICD-10-CM | POA: Diagnosis not present

## 2024-04-16 DIAGNOSIS — Z9181 History of falling: Secondary | ICD-10-CM | POA: Diagnosis not present

## 2024-04-16 DIAGNOSIS — M158 Other polyosteoarthritis: Secondary | ICD-10-CM | POA: Diagnosis not present

## 2024-04-16 DIAGNOSIS — R2681 Unsteadiness on feet: Secondary | ICD-10-CM | POA: Diagnosis not present

## 2024-04-16 DIAGNOSIS — R2689 Other abnormalities of gait and mobility: Secondary | ICD-10-CM | POA: Diagnosis not present

## 2024-04-16 DIAGNOSIS — R498 Other voice and resonance disorders: Secondary | ICD-10-CM | POA: Diagnosis not present

## 2024-04-18 DIAGNOSIS — M6281 Muscle weakness (generalized): Secondary | ICD-10-CM | POA: Diagnosis not present

## 2024-04-18 DIAGNOSIS — Z9181 History of falling: Secondary | ICD-10-CM | POA: Diagnosis not present

## 2024-04-21 DIAGNOSIS — R498 Other voice and resonance disorders: Secondary | ICD-10-CM | POA: Diagnosis not present

## 2024-04-21 DIAGNOSIS — R488 Other symbolic dysfunctions: Secondary | ICD-10-CM | POA: Diagnosis not present

## 2024-04-22 DIAGNOSIS — R498 Other voice and resonance disorders: Secondary | ICD-10-CM | POA: Diagnosis not present

## 2024-04-22 DIAGNOSIS — R488 Other symbolic dysfunctions: Secondary | ICD-10-CM | POA: Diagnosis not present

## 2024-04-23 DIAGNOSIS — R2689 Other abnormalities of gait and mobility: Secondary | ICD-10-CM | POA: Diagnosis not present

## 2024-04-23 DIAGNOSIS — R2681 Unsteadiness on feet: Secondary | ICD-10-CM | POA: Diagnosis not present

## 2024-04-23 DIAGNOSIS — Z9181 History of falling: Secondary | ICD-10-CM | POA: Diagnosis not present

## 2024-04-23 DIAGNOSIS — M158 Other polyosteoarthritis: Secondary | ICD-10-CM | POA: Diagnosis not present

## 2024-04-24 DIAGNOSIS — R488 Other symbolic dysfunctions: Secondary | ICD-10-CM | POA: Diagnosis not present

## 2024-04-24 DIAGNOSIS — R2681 Unsteadiness on feet: Secondary | ICD-10-CM | POA: Diagnosis not present

## 2024-04-24 DIAGNOSIS — M158 Other polyosteoarthritis: Secondary | ICD-10-CM | POA: Diagnosis not present

## 2024-04-24 DIAGNOSIS — R2689 Other abnormalities of gait and mobility: Secondary | ICD-10-CM | POA: Diagnosis not present

## 2024-04-24 DIAGNOSIS — Z9181 History of falling: Secondary | ICD-10-CM | POA: Diagnosis not present

## 2024-04-24 DIAGNOSIS — R498 Other voice and resonance disorders: Secondary | ICD-10-CM | POA: Diagnosis not present

## 2024-04-25 DIAGNOSIS — R2689 Other abnormalities of gait and mobility: Secondary | ICD-10-CM | POA: Diagnosis not present

## 2024-04-25 DIAGNOSIS — Z9181 History of falling: Secondary | ICD-10-CM | POA: Diagnosis not present

## 2024-04-25 DIAGNOSIS — M158 Other polyosteoarthritis: Secondary | ICD-10-CM | POA: Diagnosis not present

## 2024-04-25 DIAGNOSIS — R2681 Unsteadiness on feet: Secondary | ICD-10-CM | POA: Diagnosis not present

## 2024-04-28 DIAGNOSIS — R488 Other symbolic dysfunctions: Secondary | ICD-10-CM | POA: Diagnosis not present

## 2024-04-28 DIAGNOSIS — R498 Other voice and resonance disorders: Secondary | ICD-10-CM | POA: Diagnosis not present

## 2024-04-29 DIAGNOSIS — J449 Chronic obstructive pulmonary disease, unspecified: Secondary | ICD-10-CM | POA: Diagnosis not present

## 2024-04-29 DIAGNOSIS — I10 Essential (primary) hypertension: Secondary | ICD-10-CM | POA: Diagnosis not present

## 2024-04-29 DIAGNOSIS — R498 Other voice and resonance disorders: Secondary | ICD-10-CM | POA: Diagnosis not present

## 2024-04-29 DIAGNOSIS — R488 Other symbolic dysfunctions: Secondary | ICD-10-CM | POA: Diagnosis not present

## 2024-04-30 DIAGNOSIS — M1990 Unspecified osteoarthritis, unspecified site: Secondary | ICD-10-CM | POA: Diagnosis not present

## 2024-04-30 DIAGNOSIS — J449 Chronic obstructive pulmonary disease, unspecified: Secondary | ICD-10-CM | POA: Diagnosis not present

## 2024-04-30 DIAGNOSIS — R2689 Other abnormalities of gait and mobility: Secondary | ICD-10-CM | POA: Diagnosis not present

## 2024-04-30 DIAGNOSIS — M158 Other polyosteoarthritis: Secondary | ICD-10-CM | POA: Diagnosis not present

## 2024-04-30 DIAGNOSIS — E039 Hypothyroidism, unspecified: Secondary | ICD-10-CM | POA: Diagnosis not present

## 2024-04-30 DIAGNOSIS — R2681 Unsteadiness on feet: Secondary | ICD-10-CM | POA: Diagnosis not present

## 2024-04-30 DIAGNOSIS — Z9181 History of falling: Secondary | ICD-10-CM | POA: Diagnosis not present

## 2024-05-01 DIAGNOSIS — Z9181 History of falling: Secondary | ICD-10-CM | POA: Diagnosis not present

## 2024-05-01 DIAGNOSIS — R2689 Other abnormalities of gait and mobility: Secondary | ICD-10-CM | POA: Diagnosis not present

## 2024-05-01 DIAGNOSIS — M158 Other polyosteoarthritis: Secondary | ICD-10-CM | POA: Diagnosis not present

## 2024-05-01 DIAGNOSIS — R2681 Unsteadiness on feet: Secondary | ICD-10-CM | POA: Diagnosis not present

## 2024-05-02 DIAGNOSIS — R488 Other symbolic dysfunctions: Secondary | ICD-10-CM | POA: Diagnosis not present

## 2024-05-02 DIAGNOSIS — R498 Other voice and resonance disorders: Secondary | ICD-10-CM | POA: Diagnosis not present

## 2024-05-05 DIAGNOSIS — R488 Other symbolic dysfunctions: Secondary | ICD-10-CM | POA: Diagnosis not present

## 2024-05-05 DIAGNOSIS — R498 Other voice and resonance disorders: Secondary | ICD-10-CM | POA: Diagnosis not present

## 2024-05-06 DIAGNOSIS — R2681 Unsteadiness on feet: Secondary | ICD-10-CM | POA: Diagnosis not present

## 2024-05-06 DIAGNOSIS — R2689 Other abnormalities of gait and mobility: Secondary | ICD-10-CM | POA: Diagnosis not present

## 2024-05-06 DIAGNOSIS — Z9181 History of falling: Secondary | ICD-10-CM | POA: Diagnosis not present

## 2024-05-06 DIAGNOSIS — M158 Other polyosteoarthritis: Secondary | ICD-10-CM | POA: Diagnosis not present

## 2024-05-07 DIAGNOSIS — R2689 Other abnormalities of gait and mobility: Secondary | ICD-10-CM | POA: Diagnosis not present

## 2024-05-07 DIAGNOSIS — R488 Other symbolic dysfunctions: Secondary | ICD-10-CM | POA: Diagnosis not present

## 2024-05-07 DIAGNOSIS — Z9181 History of falling: Secondary | ICD-10-CM | POA: Diagnosis not present

## 2024-05-07 DIAGNOSIS — R498 Other voice and resonance disorders: Secondary | ICD-10-CM | POA: Diagnosis not present

## 2024-05-07 DIAGNOSIS — R2681 Unsteadiness on feet: Secondary | ICD-10-CM | POA: Diagnosis not present

## 2024-05-07 DIAGNOSIS — M158 Other polyosteoarthritis: Secondary | ICD-10-CM | POA: Diagnosis not present

## 2024-05-08 DIAGNOSIS — R2681 Unsteadiness on feet: Secondary | ICD-10-CM | POA: Diagnosis not present

## 2024-05-08 DIAGNOSIS — Z9181 History of falling: Secondary | ICD-10-CM | POA: Diagnosis not present

## 2024-05-08 DIAGNOSIS — R2689 Other abnormalities of gait and mobility: Secondary | ICD-10-CM | POA: Diagnosis not present

## 2024-05-08 DIAGNOSIS — M158 Other polyosteoarthritis: Secondary | ICD-10-CM | POA: Diagnosis not present

## 2024-05-12 DIAGNOSIS — M158 Other polyosteoarthritis: Secondary | ICD-10-CM | POA: Diagnosis not present

## 2024-05-12 DIAGNOSIS — Z9181 History of falling: Secondary | ICD-10-CM | POA: Diagnosis not present

## 2024-05-12 DIAGNOSIS — R498 Other voice and resonance disorders: Secondary | ICD-10-CM | POA: Diagnosis not present

## 2024-05-12 DIAGNOSIS — R2681 Unsteadiness on feet: Secondary | ICD-10-CM | POA: Diagnosis not present

## 2024-05-12 DIAGNOSIS — R488 Other symbolic dysfunctions: Secondary | ICD-10-CM | POA: Diagnosis not present

## 2024-05-12 DIAGNOSIS — R2689 Other abnormalities of gait and mobility: Secondary | ICD-10-CM | POA: Diagnosis not present

## 2024-05-14 DIAGNOSIS — R488 Other symbolic dysfunctions: Secondary | ICD-10-CM | POA: Diagnosis not present

## 2024-05-14 DIAGNOSIS — R498 Other voice and resonance disorders: Secondary | ICD-10-CM | POA: Diagnosis not present

## 2024-05-14 DIAGNOSIS — R2681 Unsteadiness on feet: Secondary | ICD-10-CM | POA: Diagnosis not present

## 2024-05-14 DIAGNOSIS — R2689 Other abnormalities of gait and mobility: Secondary | ICD-10-CM | POA: Diagnosis not present

## 2024-05-14 DIAGNOSIS — Z9181 History of falling: Secondary | ICD-10-CM | POA: Diagnosis not present

## 2024-05-14 DIAGNOSIS — M158 Other polyosteoarthritis: Secondary | ICD-10-CM | POA: Diagnosis not present

## 2024-05-15 DIAGNOSIS — R2689 Other abnormalities of gait and mobility: Secondary | ICD-10-CM | POA: Diagnosis not present

## 2024-05-15 DIAGNOSIS — R2681 Unsteadiness on feet: Secondary | ICD-10-CM | POA: Diagnosis not present

## 2024-05-15 DIAGNOSIS — M158 Other polyosteoarthritis: Secondary | ICD-10-CM | POA: Diagnosis not present

## 2024-05-15 DIAGNOSIS — Z9181 History of falling: Secondary | ICD-10-CM | POA: Diagnosis not present

## 2024-05-19 DIAGNOSIS — Z9181 History of falling: Secondary | ICD-10-CM | POA: Diagnosis not present

## 2024-05-19 DIAGNOSIS — R2689 Other abnormalities of gait and mobility: Secondary | ICD-10-CM | POA: Diagnosis not present

## 2024-05-19 DIAGNOSIS — M158 Other polyosteoarthritis: Secondary | ICD-10-CM | POA: Diagnosis not present

## 2024-05-19 DIAGNOSIS — R2681 Unsteadiness on feet: Secondary | ICD-10-CM | POA: Diagnosis not present

## 2024-05-20 DIAGNOSIS — R488 Other symbolic dysfunctions: Secondary | ICD-10-CM | POA: Diagnosis not present

## 2024-05-20 DIAGNOSIS — Z9181 History of falling: Secondary | ICD-10-CM | POA: Diagnosis not present

## 2024-05-20 DIAGNOSIS — M158 Other polyosteoarthritis: Secondary | ICD-10-CM | POA: Diagnosis not present

## 2024-05-20 DIAGNOSIS — R498 Other voice and resonance disorders: Secondary | ICD-10-CM | POA: Diagnosis not present

## 2024-05-20 DIAGNOSIS — R2681 Unsteadiness on feet: Secondary | ICD-10-CM | POA: Diagnosis not present

## 2024-05-20 DIAGNOSIS — R2689 Other abnormalities of gait and mobility: Secondary | ICD-10-CM | POA: Diagnosis not present

## 2024-05-21 DIAGNOSIS — R2681 Unsteadiness on feet: Secondary | ICD-10-CM | POA: Diagnosis not present

## 2024-05-21 DIAGNOSIS — M158 Other polyosteoarthritis: Secondary | ICD-10-CM | POA: Diagnosis not present

## 2024-05-21 DIAGNOSIS — R2689 Other abnormalities of gait and mobility: Secondary | ICD-10-CM | POA: Diagnosis not present

## 2024-05-21 DIAGNOSIS — Z9181 History of falling: Secondary | ICD-10-CM | POA: Diagnosis not present

## 2024-05-22 DIAGNOSIS — R498 Other voice and resonance disorders: Secondary | ICD-10-CM | POA: Diagnosis not present

## 2024-05-22 DIAGNOSIS — R488 Other symbolic dysfunctions: Secondary | ICD-10-CM | POA: Diagnosis not present

## 2024-05-22 DIAGNOSIS — R2681 Unsteadiness on feet: Secondary | ICD-10-CM | POA: Diagnosis not present

## 2024-05-22 DIAGNOSIS — R2689 Other abnormalities of gait and mobility: Secondary | ICD-10-CM | POA: Diagnosis not present

## 2024-05-22 DIAGNOSIS — Z9181 History of falling: Secondary | ICD-10-CM | POA: Diagnosis not present

## 2024-05-22 DIAGNOSIS — M158 Other polyosteoarthritis: Secondary | ICD-10-CM | POA: Diagnosis not present

## 2024-05-23 DIAGNOSIS — E559 Vitamin D deficiency, unspecified: Secondary | ICD-10-CM | POA: Diagnosis not present

## 2024-05-23 DIAGNOSIS — E039 Hypothyroidism, unspecified: Secondary | ICD-10-CM | POA: Diagnosis not present

## 2024-05-23 DIAGNOSIS — G894 Chronic pain syndrome: Secondary | ICD-10-CM | POA: Diagnosis not present

## 2024-05-23 DIAGNOSIS — M8580 Other specified disorders of bone density and structure, unspecified site: Secondary | ICD-10-CM | POA: Diagnosis not present

## 2024-05-25 DIAGNOSIS — R498 Other voice and resonance disorders: Secondary | ICD-10-CM | POA: Diagnosis not present

## 2024-05-25 DIAGNOSIS — R488 Other symbolic dysfunctions: Secondary | ICD-10-CM | POA: Diagnosis not present

## 2024-05-27 DIAGNOSIS — R498 Other voice and resonance disorders: Secondary | ICD-10-CM | POA: Diagnosis not present

## 2024-05-27 DIAGNOSIS — R2689 Other abnormalities of gait and mobility: Secondary | ICD-10-CM | POA: Diagnosis not present

## 2024-05-27 DIAGNOSIS — Z9181 History of falling: Secondary | ICD-10-CM | POA: Diagnosis not present

## 2024-05-27 DIAGNOSIS — M158 Other polyosteoarthritis: Secondary | ICD-10-CM | POA: Diagnosis not present

## 2024-05-27 DIAGNOSIS — R488 Other symbolic dysfunctions: Secondary | ICD-10-CM | POA: Diagnosis not present

## 2024-05-27 DIAGNOSIS — R2681 Unsteadiness on feet: Secondary | ICD-10-CM | POA: Diagnosis not present

## 2024-06-03 DIAGNOSIS — E119 Type 2 diabetes mellitus without complications: Secondary | ICD-10-CM | POA: Diagnosis not present

## 2024-06-03 DIAGNOSIS — D509 Iron deficiency anemia, unspecified: Secondary | ICD-10-CM | POA: Diagnosis not present

## 2024-06-03 DIAGNOSIS — G894 Chronic pain syndrome: Secondary | ICD-10-CM | POA: Diagnosis not present

## 2024-06-03 DIAGNOSIS — Z1331 Encounter for screening for depression: Secondary | ICD-10-CM | POA: Diagnosis not present

## 2024-06-03 DIAGNOSIS — E538 Deficiency of other specified B group vitamins: Secondary | ICD-10-CM | POA: Diagnosis not present

## 2024-06-03 DIAGNOSIS — E785 Hyperlipidemia, unspecified: Secondary | ICD-10-CM | POA: Diagnosis not present

## 2024-06-03 DIAGNOSIS — E559 Vitamin D deficiency, unspecified: Secondary | ICD-10-CM | POA: Diagnosis not present

## 2024-06-15 ENCOUNTER — Emergency Department

## 2024-06-15 ENCOUNTER — Other Ambulatory Visit: Payer: Self-pay

## 2024-06-15 ENCOUNTER — Inpatient Hospital Stay
Admission: EM | Admit: 2024-06-15 | Discharge: 2024-06-20 | DRG: 291 | Disposition: A | Discharge status: Home / Self Care | Attending: Internal Medicine | Admitting: Internal Medicine

## 2024-06-15 DIAGNOSIS — I110 Hypertensive heart disease with heart failure: Secondary | ICD-10-CM | POA: Diagnosis present

## 2024-06-15 DIAGNOSIS — F419 Anxiety disorder, unspecified: Secondary | ICD-10-CM | POA: Diagnosis present

## 2024-06-15 DIAGNOSIS — Z8349 Family history of other endocrine, nutritional and metabolic diseases: Secondary | ICD-10-CM

## 2024-06-15 DIAGNOSIS — W19XXXA Unspecified fall, initial encounter: Secondary | ICD-10-CM | POA: Diagnosis present

## 2024-06-15 DIAGNOSIS — R300 Dysuria: Secondary | ICD-10-CM | POA: Diagnosis present

## 2024-06-15 DIAGNOSIS — Z7984 Long term (current) use of oral hypoglycemic drugs: Secondary | ICD-10-CM

## 2024-06-15 DIAGNOSIS — G2581 Restless legs syndrome: Secondary | ICD-10-CM | POA: Diagnosis present

## 2024-06-15 DIAGNOSIS — E785 Hyperlipidemia, unspecified: Secondary | ICD-10-CM | POA: Diagnosis not present

## 2024-06-15 DIAGNOSIS — Z825 Family history of asthma and other chronic lower respiratory diseases: Secondary | ICD-10-CM

## 2024-06-15 DIAGNOSIS — Z933 Colostomy status: Secondary | ICD-10-CM | POA: Diagnosis not present

## 2024-06-15 DIAGNOSIS — E119 Type 2 diabetes mellitus without complications: Secondary | ICD-10-CM | POA: Diagnosis present

## 2024-06-15 DIAGNOSIS — I4891 Unspecified atrial fibrillation: Secondary | ICD-10-CM | POA: Diagnosis present

## 2024-06-15 DIAGNOSIS — I5033 Acute on chronic diastolic (congestive) heart failure: Secondary | ICD-10-CM | POA: Diagnosis not present

## 2024-06-15 DIAGNOSIS — Z1152 Encounter for screening for COVID-19: Secondary | ICD-10-CM | POA: Diagnosis not present

## 2024-06-15 DIAGNOSIS — Z9851 Tubal ligation status: Secondary | ICD-10-CM

## 2024-06-15 DIAGNOSIS — Z8616 Personal history of COVID-19: Secondary | ICD-10-CM | POA: Diagnosis not present

## 2024-06-15 DIAGNOSIS — F32A Depression, unspecified: Secondary | ICD-10-CM | POA: Diagnosis not present

## 2024-06-15 DIAGNOSIS — G894 Chronic pain syndrome: Secondary | ICD-10-CM | POA: Diagnosis present

## 2024-06-15 DIAGNOSIS — E039 Hypothyroidism, unspecified: Secondary | ICD-10-CM | POA: Diagnosis present

## 2024-06-15 DIAGNOSIS — Z79899 Other long term (current) drug therapy: Secondary | ICD-10-CM

## 2024-06-15 DIAGNOSIS — R0902 Hypoxemia: Secondary | ICD-10-CM | POA: Diagnosis present

## 2024-06-15 DIAGNOSIS — I700 Atherosclerosis of aorta: Secondary | ICD-10-CM | POA: Diagnosis not present

## 2024-06-15 DIAGNOSIS — M8580 Other specified disorders of bone density and structure, unspecified site: Secondary | ICD-10-CM | POA: Diagnosis present

## 2024-06-15 DIAGNOSIS — I10 Essential (primary) hypertension: Secondary | ICD-10-CM | POA: Diagnosis not present

## 2024-06-15 DIAGNOSIS — J189 Pneumonia, unspecified organism: Secondary | ICD-10-CM | POA: Diagnosis present

## 2024-06-15 DIAGNOSIS — Z90710 Acquired absence of both cervix and uterus: Secondary | ICD-10-CM

## 2024-06-15 DIAGNOSIS — Z833 Family history of diabetes mellitus: Secondary | ICD-10-CM

## 2024-06-15 DIAGNOSIS — Z87891 Personal history of nicotine dependence: Secondary | ICD-10-CM

## 2024-06-15 DIAGNOSIS — Z8249 Family history of ischemic heart disease and other diseases of the circulatory system: Secondary | ICD-10-CM | POA: Diagnosis not present

## 2024-06-15 DIAGNOSIS — R350 Frequency of micturition: Secondary | ICD-10-CM | POA: Diagnosis present

## 2024-06-15 DIAGNOSIS — I509 Heart failure, unspecified: Secondary | ICD-10-CM

## 2024-06-15 DIAGNOSIS — Z885 Allergy status to narcotic agent status: Secondary | ICD-10-CM

## 2024-06-15 DIAGNOSIS — S199XXA Unspecified injury of neck, initial encounter: Secondary | ICD-10-CM | POA: Diagnosis not present

## 2024-06-15 DIAGNOSIS — M47812 Spondylosis without myelopathy or radiculopathy, cervical region: Secondary | ICD-10-CM | POA: Diagnosis not present

## 2024-06-15 DIAGNOSIS — G43909 Migraine, unspecified, not intractable, without status migrainosus: Secondary | ICD-10-CM | POA: Diagnosis present

## 2024-06-15 DIAGNOSIS — J440 Chronic obstructive pulmonary disease with acute lower respiratory infection: Secondary | ICD-10-CM | POA: Diagnosis present

## 2024-06-15 DIAGNOSIS — Z888 Allergy status to other drugs, medicaments and biological substances status: Secondary | ICD-10-CM

## 2024-06-15 DIAGNOSIS — R918 Other nonspecific abnormal finding of lung field: Secondary | ICD-10-CM | POA: Diagnosis not present

## 2024-06-15 DIAGNOSIS — I517 Cardiomegaly: Secondary | ICD-10-CM | POA: Diagnosis not present

## 2024-06-15 DIAGNOSIS — Z555 Less than a high school diploma: Secondary | ICD-10-CM

## 2024-06-15 DIAGNOSIS — Z79890 Hormone replacement therapy (postmenopausal): Secondary | ICD-10-CM | POA: Diagnosis not present

## 2024-06-15 DIAGNOSIS — S0990XA Unspecified injury of head, initial encounter: Secondary | ICD-10-CM | POA: Diagnosis not present

## 2024-06-15 LAB — URINALYSIS, W/ REFLEX TO CULTURE (INFECTION SUSPECTED)
Bilirubin Urine: NEGATIVE
Glucose, UA: NEGATIVE mg/dL
Hgb urine dipstick: NEGATIVE
Ketones, ur: NEGATIVE mg/dL
Nitrite: NEGATIVE
Protein, ur: 100 mg/dL — AB
Specific Gravity, Urine: 1.016 (ref 1.005–1.030)
pH: 5 (ref 5.0–8.0)

## 2024-06-15 LAB — COMPREHENSIVE METABOLIC PANEL WITH GFR
ALT: 16 U/L (ref 0–44)
AST: 30 U/L (ref 15–41)
Albumin: 4.9 g/dL (ref 3.5–5.0)
Alkaline Phosphatase: 115 U/L (ref 38–126)
Anion gap: 13 (ref 5–15)
BUN: 20 mg/dL (ref 8–23)
CO2: 27 mmol/L (ref 22–32)
Calcium: 9.7 mg/dL (ref 8.9–10.3)
Chloride: 102 mmol/L (ref 98–111)
Creatinine, Ser: 0.88 mg/dL (ref 0.44–1.00)
GFR, Estimated: >60 mL/min (ref >60)
Glucose, Bld: 115 mg/dL — ABNORMAL HIGH (ref 70–99)
Potassium: 3.9 mmol/L (ref 3.5–5.1)
Sodium: 142 mmol/L (ref 135–145)
Total Bilirubin: 0.3 mg/dL (ref 0.0–1.2)
Total Protein: 7.7 g/dL (ref 6.5–8.1)

## 2024-06-15 LAB — CBC WITH DIFFERENTIAL/PLATELET
Abs Immature Granulocytes: 0.11 K/uL — ABNORMAL HIGH (ref 0.00–0.07)
Basophils Absolute: 0.1 K/uL (ref 0.0–0.1)
Basophils Relative: 0 %
Eosinophils Absolute: 0 K/uL (ref 0.0–0.5)
Eosinophils Relative: 0 %
HCT: 41.6 % (ref 36.0–46.0)
Hemoglobin: 12.6 g/dL (ref 12.0–15.0)
Immature Granulocytes: 1 %
Lymphocytes Relative: 10 %
Lymphs Abs: 1.4 K/uL (ref 0.7–4.0)
MCH: 25.1 pg — ABNORMAL LOW (ref 26.0–34.0)
MCHC: 30.3 g/dL (ref 30.0–36.0)
MCV: 83 fL (ref 80.0–100.0)
Monocytes Absolute: 1.7 K/uL — ABNORMAL HIGH (ref 0.1–1.0)
Monocytes Relative: 13 %
Neutro Abs: 10 K/uL — ABNORMAL HIGH (ref 1.7–7.7)
Neutrophils Relative %: 76 %
Platelets: 290 K/uL (ref 150–400)
RBC: 5.01 MIL/uL (ref 3.87–5.11)
RDW: 16.1 % — ABNORMAL HIGH (ref 11.5–15.5)
WBC: 13.2 K/uL — ABNORMAL HIGH (ref 4.0–10.5)
nRBC: 0 % (ref 0.0–0.2)

## 2024-06-15 LAB — TROPONIN T, HIGH SENSITIVITY: Troponin T High Sensitivity: 33 ng/L — ABNORMAL HIGH (ref 0–19)

## 2024-06-15 LAB — LACTIC ACID, PLASMA: Lactic Acid, Venous: 1.3 mmol/L (ref 0.5–1.9)

## 2024-06-15 LAB — CK: Total CK: 221 U/L (ref 38–234)

## 2024-06-15 LAB — PRO BRAIN NATRIURETIC PEPTIDE: Pro Brain Natriuretic Peptide: 1638 pg/mL — ABNORMAL HIGH (ref <300.0)

## 2024-06-15 MED ORDER — SODIUM CHLORIDE 0.9 % IV SOLN: INTRAVENOUS | Status: DC

## 2024-06-15 MED ORDER — ONDANSETRON HCL 4 MG/2ML IJ SOLN
4.0000 mg | Freq: Four times a day (QID) | INTRAMUSCULAR | Status: DC | PRN
  Administered 2024-06-16 – 2024-06-20 (×5): 4 mg via INTRAVENOUS
  Filled 2024-06-15 (×5): qty 2

## 2024-06-15 MED ORDER — SODIUM CHLORIDE 0.9 % IV SOLN
500.0000 mg | Freq: Once | INTRAVENOUS | Status: AC
  Administered 2024-06-15: 500 mg via INTRAVENOUS
  Filled 2024-06-15: qty 5

## 2024-06-15 MED ORDER — LEVOTHYROXINE SODIUM 50 MCG PO TABS
50.0000 ug | ORAL_TABLET | Freq: Every day | ORAL | Status: DC
  Administered 2024-06-16 – 2024-06-20 (×5): 50 ug via ORAL
  Filled 2024-06-15 (×5): qty 1

## 2024-06-15 MED ORDER — ONDANSETRON HCL 4 MG PO TABS
4.0000 mg | ORAL_TABLET | Freq: Four times a day (QID) | ORAL | Status: DC | PRN
  Administered 2024-06-16 – 2024-06-20 (×7): 4 mg via ORAL
  Filled 2024-06-15 (×8): qty 1

## 2024-06-15 MED ORDER — METOPROLOL TARTRATE 25 MG PO TABS
12.5000 mg | ORAL_TABLET | Freq: Two times a day (BID) | ORAL | Status: DC
  Administered 2024-06-16 – 2024-06-20 (×8): 12.5 mg via ORAL
  Filled 2024-06-15 (×9): qty 1

## 2024-06-15 MED ORDER — SODIUM CHLORIDE 0.9 % IV SOLN
1.0000 g | Freq: Once | INTRAVENOUS | Status: AC
  Administered 2024-06-15: 1 g via INTRAVENOUS
  Filled 2024-06-15: qty 10

## 2024-06-15 MED ORDER — LOSARTAN POTASSIUM 25 MG PO TABS
25.0000 mg | ORAL_TABLET | Freq: Every day | ORAL | Status: DC
  Administered 2024-06-16 – 2024-06-19 (×4): 25 mg via ORAL
  Filled 2024-06-15 (×4): qty 1

## 2024-06-15 MED ORDER — OXYCODONE HCL 5 MG PO TABS
5.0000 mg | ORAL_TABLET | Freq: Four times a day (QID) | ORAL | Status: DC | PRN
  Administered 2024-06-16 – 2024-06-20 (×15): 5 mg via ORAL
  Filled 2024-06-15 (×17): qty 1

## 2024-06-15 MED ORDER — FUROSEMIDE 10 MG/ML IJ SOLN
40.0000 mg | Freq: Once | INTRAMUSCULAR | Status: AC
  Administered 2024-06-15: 40 mg via INTRAVENOUS
  Filled 2024-06-15: qty 4

## 2024-06-15 MED ORDER — AMLODIPINE BESYLATE 10 MG PO TABS
10.0000 mg | ORAL_TABLET | Freq: Every day | ORAL | Status: DC
  Administered 2024-06-16 – 2024-06-19 (×4): 10 mg via ORAL
  Filled 2024-06-15 (×4): qty 1

## 2024-06-15 MED ORDER — ACETAMINOPHEN 650 MG RE SUPP: 650.0000 mg | Freq: Four times a day (QID) | RECTAL | Status: DC | PRN

## 2024-06-15 MED ORDER — ADULT MULTIVITAMIN W/MINERALS CH
1.0000 | ORAL_TABLET | Freq: Every day | ORAL | Status: DC
  Administered 2024-06-16 – 2024-06-20 (×5): 1 via ORAL
  Filled 2024-06-15 (×5): qty 1

## 2024-06-15 MED ORDER — LIDOCAINE 5 % EX PTCH: 1.0000 | MEDICATED_PATCH | CUTANEOUS | Status: DC

## 2024-06-15 MED ORDER — MAGNESIUM HYDROXIDE 400 MG/5ML PO SUSP: 30.0000 mL | Freq: Every day | ORAL | Status: DC | PRN

## 2024-06-15 MED ORDER — FOLIC ACID 1 MG PO TABS
1.0000 mg | ORAL_TABLET | Freq: Every day | ORAL | Status: DC
  Administered 2024-06-16 – 2024-06-20 (×5): 1 mg via ORAL
  Filled 2024-06-15 (×5): qty 1

## 2024-06-15 MED ORDER — TRAZODONE HCL 50 MG PO TABS
25.0000 mg | ORAL_TABLET | Freq: Every evening | ORAL | Status: DC | PRN
  Administered 2024-06-17 – 2024-06-19 (×3): 25 mg via ORAL
  Filled 2024-06-15 (×3): qty 1

## 2024-06-15 MED ORDER — ENOXAPARIN SODIUM 40 MG/0.4ML IJ SOSY
40.0000 mg | PREFILLED_SYRINGE | INTRAMUSCULAR | Status: DC
  Administered 2024-06-16 – 2024-06-20 (×5): 40 mg via SUBCUTANEOUS
  Filled 2024-06-15 (×5): qty 0.4

## 2024-06-15 MED ORDER — ACETAMINOPHEN 325 MG PO TABS: 650.0000 mg | ORAL_TABLET | Freq: Four times a day (QID) | ORAL | Status: DC | PRN

## 2024-06-15 NOTE — ED Triage Notes (Addendum)
 BIB [facility] from [facility].  Patient fall. Found on floor, right sided head abrasion, bleeding controlled. AOx3.  Hx UTI.  CBG: 143. EMS reports strong smelling urine   99.2 83 % RA, placed on 4l/ [place] 93-94% 192/88  C/O nausea.  AAOx4 skin warm and dry. NAD

## 2024-06-15 NOTE — H&P (Signed)
 [place]   PATIENT NAME: Stacy Dalton    MR#:  [id number]  DATE OF BIRTH:  17-Jona Erkkila-1952  DATE OF ADMISSION:  06/15/2024  PRIMARY CARE PHYSICIAN: Pcp, No   Patient is coming from: [facility] independent living  REQUESTING/REFERRING PHYSICIAN: Rexford Reche HERO, MD   CHIEF COMPLAINT:  Shortness of breath  HISTORY OF PRESENT ILLNESS:  Stacy Dalton is a 73 y.o. Caucasian female with medical history significant for COPD, depression, hypertension, hypothyroidism, migraine, and panic disorder, who presented to the emergency room with acute onset of worsening dyspnea with associated cough that is mainly dry as well as wheezing over the last week.  She admitted to fever that was up to 102 this morning with associated chills.  She had altered mental status and was found on the floor and an unwitnessed fall and [facility] independent living where she resides.  She denied any chest pain or palpitations.  No nausea or vomiting or diarrhea or abdominal pain.  She has been having urinary frequency, and urgency as well as dysuria without hematuria or flank pain.  No bleeding diathesis.  The patient is not on home oxygen.  ED Course: When she came to the ER, BP was 188/80 and revealed unremarkable CMP.  proBNP was 1638.  Total CK2 21 and high-sensitivity troponin T was 33.  Lactic acid was 1.3.  CBC showed leukocytosis of 13.2 with neutrophilia.  UA showed rare bacteria and trace leukocytes with 100 protein, negative nitrites and only 0-5 WBCs. EKG as reviewed by me : Pending. Imaging: 2 view chest x-ray showed possible mild interstitial edema, mild cardiomegaly and mild bilateral lower lobe opacities, likely atelectasis.  Noncontrasted head CT scan showed no acute contrecoup abnormality.  C-spine CT showed no evidence of acute traumatic injury.  The patient was given 40 mg of IV Lasix  and a gram of IV Rocephin  as well as 500 mg of IV Zithromax .  She will be admitted to a medical  telemetry bed for further evaluation and management. PAST MEDICAL HISTORY:   Past Medical History:  Diagnosis Date   Chronic pain    COPD (chronic obstructive pulmonary disease) (HCC)    told has copd, no current inhaler use   Cough    Depression    ETOH abuse    GERD (gastroesophageal reflux disease)    Hypertension    Hypothyroidism    Insomnia    Migraine    Migraine    Osteopenia    Pain management    Panic attacks    Small bowel obstruction (HCC)     PAST SURGICAL HISTORY:   Past Surgical History:  Procedure Laterality Date   ABDOMINAL HYSTERECTOMY     BIOPSY  08/29/2021   Procedure: BIOPSY;  Surgeon: Cindie Carlin POUR, DO;  Location: [facility];  Service: Endoscopy;;   COLON SURGERY     COLOSTOMY CLOSURE  04/19/2012   Procedure: COLOSTOMY CLOSURE;  Surgeon: Elon HERO Pacini, MD;  Location: [facility];  Service: General;  Laterality: N/A;  Laparotomy, Resection and Closure of Colostomy   COLOSTOMY TAKEDOWN N/A 04/12/2016   Procedure: [place] TAKEDOWN;  Surgeon: Donnice Lunger, MD;  Location: [facility];  Service: General;  Laterality: N/A;   ESOPHAGOGASTRODUODENOSCOPY (EGD) WITH PROPOFOL  N/A 08/29/2021   Procedure: ESOPHAGOGASTRODUODENOSCOPY (EGD) WITH PROPOFOL ;  Surgeon: Cindie Carlin POUR, DO;  Location: [facility];  Service: Endoscopy;  Laterality: N/A;   INCONTINENCE SURGERY     LAPAROTOMY  10/04/2011, colostomy also  Procedure: EXPLORATORY LAPAROTOMY;  Surgeon: Elon CHRISTELLA Pacini, MD;  Location: [facility];  Service: General;  Laterality: N/A;  left partial colectomy with colostomy   LAPAROTOMY  04/19/2012   Procedure: EXPLORATORY LAPAROTOMY;  Surgeon: Elon CHRISTELLA Pacini, MD;  Location: [facility];  Service: General;  Laterality: N/A;   LAPAROTOMY N/A 11/29/2015   Procedure: EXPLORATORY LAPAROTOMY; SUBTOTAL COLECTOMY WITH HARTMAN PROCEDURE AND END COLOSTOMY;  Surgeon: Donnice Lunger, MD;  Location: [facility];  Service: General;  Laterality: N/A;   TUBAL LIGATION     VENTRAL HERNIA  REPAIR  04/19/2012   Procedure: HERNIA REPAIR VENTRAL ADULT;  Surgeon: Elon CHRISTELLA Pacini, MD;  Location: [facility];  Service: General;  Laterality: N/A;    SOCIAL HISTORY:   Social History   Tobacco Use   Smoking status: Former    Current packs/day: 0.00    Average packs/day: 1.5 packs/day for 20.0 years (30.0 ttl pk-yrs)    Types: Cigarettes    Start date: 10/04/1978    Quit date: 10/04/1998    Years since quitting: 25.7   Smokeless tobacco: Never  Substance Use Topics   Alcohol  use: Yes    Alcohol /week: 3.0 standard drinks of alcohol     Types: 3 Shots of liquor per week    Comment: Daily    FAMILY HISTORY:   Family History  Problem Relation Age of Onset   Heart disease Mother    Hypertension Mother    Cancer Sister        sinus   Diabetes Brother    Heart disease Brother    Thyroid  disease Brother    Cancer Sister        abdominal ?   Thyroid  disease Sister    Cancer Other        GE junction adenocarcinoma   Emphysema Father    Stroke Father    Hypertension Father    Heart disease Father    COPD Sister    Thyroid  disease Sister    Thyroid  disease Sister    Diabetes Brother    Thyroid  disease Brother    Thyroid  disease Brother    Hypertension Brother    Post-traumatic stress disorder Brother    COPD Brother    Thyroid  disease Brother    Thyroid  disease Brother    Hypertension Brother    Transient ischemic attack Brother     DRUG ALLERGIES:  Allergies[1]  REVIEW OF SYSTEMS:   ROS As per history of present illness. All pertinent systems were reviewed above. Constitutional, HEENT, cardiovascular, respiratory, GI, GU, musculoskeletal, neuro, psychiatric, endocrine, integumentary and hematologic systems were reviewed and are otherwise negative/unremarkable except for positive findings mentioned above in the HPI.   MEDICATIONS AT HOME:   Prior to Admission medications  Medication Sig Start Date End Date Taking? Authorizing Provider  acetaminophen  (TYLENOL )  325 MG tablet Take 2 tablets (650 mg total) by mouth every 6 (six) hours as needed for mild pain (pain score 1-3), fever or headache (or Fever >/= 101). 05/14/23   Johnson, Clanford L, MD  albuterol  (VENTOLIN  HFA) 108 (90 Base) MCG/ACT inhaler Inhale 2 puffs into the lungs every 4 (four) hours as needed for wheezing or shortness of breath. 03/28/22   Pearlean Manus, MD  amLODipine  (NORVASC ) 10 MG tablet Take 1 tablet (10 mg total) by mouth daily. 03/28/22   Pearlean Manus, MD  folic acid  (FOLVITE ) 1 MG tablet Take 1 tablet (1 mg total) by mouth daily. 05/15/23   Vicci Afton CROME, MD  ipratropium-albuterol  (  DUONEB) 0.5-2.5 (3) MG/3ML SOLN Take 3 mLs by nebulization 3 (three) times daily. 05/14/23   Vicci Afton CROME, MD  levothyroxine  (SYNTHROID ) 50 MCG tablet Take 1 tablet (50 mcg total) by mouth daily before breakfast. 03/28/22   Pearlean Manus, MD  lidocaine  (LIDODERM ) 5 % Place 1 patch onto the skin daily. Remove & Discard patch within 12 hours or as directed by MD 05/15/23   Vicci Afton CROME, MD  losartan  (COZAAR ) 25 MG tablet Take 1 tablet (25 mg total) by mouth daily. 05/14/23   Johnson, Clanford L, MD  metoprolol  tartrate (LOPRESSOR ) 25 MG tablet Take 0.5 tablets (12.5 mg total) by mouth 2 (two) times daily. 05/14/23   Vicci Afton CROME, MD  Multiple Vitamin (MULTIVITAMIN WITH MINERALS) TABS tablet Take 1 tablet by mouth daily. 05/15/23   Johnson, Clanford L, MD  oxyCODONE  (OXY IR/ROXICODONE ) 5 MG immediate release tablet Take 1 tablet (5 mg total) by mouth every 6 (six) hours as needed for severe pain (pain score 7-10). 05/14/23   Johnson, Clanford L, MD  thiamine  (VITAMIN B-1) 100 MG tablet Take 1 tablet (100 mg total) by mouth daily. 05/15/23   Johnson, Clanford L, MD      VITAL SIGNS:  Blood pressure (!) 155/52, pulse 74, temperature 98.3 F (36.8 C), temperature source Oral, resp. rate 19, height 5' 1 (1.549 m), weight 69.6 kg, SpO2 95%.  PHYSICAL EXAMINATION:  Physical  Exam  GENERAL:  73 y.o.-year-old patient lying in the bed with mild respiratory distress with conversational dyspnea. EYES: Pupils equal, round, reactive to light and accommodation. No scleral icterus. Extraocular muscles intact.  HEENT: Head atraumatic, normocephalic. Oropharynx and nasopharynx clear.  NECK:  Supple, no jugular venous distention. No thyroid  enlargement, no tenderness.  LUNGS: Diminished bibasilar breath sounds with bibasal rales.  No use of accessory muscles of respiration.  CARDIOVASCULAR: Regular rate and rhythm, S1, S2 normal. No murmurs, rubs, or gallops.  ABDOMEN: Soft, nondistended, nontender. Bowel sounds present. No organomegaly or mass.  EXTREMITIES: Trace bilateral lower extremity pitting edema with no cyanosis, or clubbing.  NEUROLOGIC: Cranial nerves II through XII are intact. Muscle strength 5/5 in all extremities. Sensation intact. Gait not checked.  PSYCHIATRIC: The patient is alert and oriented x 3.  Normal affect and good eye contact. SKIN: No obvious rash, lesion, or ulcer.   LABORATORY PANEL:   CBC Recent Labs  Lab 06/15/24 1649  WBC 13.2*  HGB 12.6  HCT 41.6  PLT 290   ------------------------------------------------------------------------------------------------------------------  Chemistries  Recent Labs  Lab 06/15/24 1649  NA 142  K 3.9  CL 102  CO2 27  GLUCOSE 115*  BUN 20  CREATININE 0.88  CALCIUM  9.7  AST 30  ALT 16  ALKPHOS 115  BILITOT 0.3   ------------------------------------------------------------------------------------------------------------------  Cardiac Enzymes No results for input(s): TROPONINI in the last 168 hours. ------------------------------------------------------------------------------------------------------------------  RADIOLOGY:  CT Cervical Spine Wo Contrast Result Date: 06/15/2024 EXAM: CT CERVICAL SPINE WITHOUT CONTRAST 06/15/2024 05:19:08 PM TECHNIQUE: CT of the cervical spine was  performed without the administration of intravenous contrast. Multiplanar reformatted images are provided for review. Automated exposure control, iterative reconstruction, and/or weight based adjustment of the mA/kV was utilized to reduce the radiation dose to as low as reasonably achievable. COMPARISON: None available. CLINICAL HISTORY: Neck trauma (Age >= 65y) FINDINGS: BONES AND ALIGNMENT: No acute fracture or traumatic malalignment. DEGENERATIVE CHANGES: Mild degenerative changes of the mid / lower cervical spine. SOFT TISSUES: No prevertebral soft tissue swelling. LIMITATIONS/ARTIFACTS: Motion degraded images. IMPRESSION:  1. No evidence of acute traumatic injury. Electronically signed by: Pinkie Pebbles MD 06/15/2024 05:49 PM EST RP Workstation: [id number]   CT Head Wo Contrast Result Date: 06/15/2024 EXAM: CT HEAD WITHOUT 06/15/2024 05:19:08 PM TECHNIQUE: CT of the head was performed without the administration of intravenous contrast. Automated exposure control, iterative reconstruction, and/or weight based adjustment of the mA/kV was utilized to reduce the radiation dose to as low as reasonably achievable. COMPARISON: 03/24/2022 CLINICAL HISTORY: Head trauma, minor (Age >= 65y) FINDINGS: BRAIN AND VENTRICLES: No acute intracranial hemorrhage. No mass effect or midline shift. No extra-axial fluid collection. No evidence of acute infarct. No hydrocephalus. ORBITS: No acute abnormality. SINUSES AND MASTOIDS: No acute abnormality. SOFT TISSUES AND SKULL: No acute skull fracture. No acute soft tissue abnormality. IMPRESSION: 1. No acute intracranial abnormality. Electronically signed by: Pinkie Pebbles MD 06/15/2024 05:48 PM EST RP Workstation: [id number]   DG Chest 2 View Result Date: 06/15/2024 EXAM: 2 VIEW(S) XRAY OF THE CHEST 06/15/2024 05:00:00 PM COMPARISON: 09/10/2023 CLINICAL HISTORY: Hypoxia FINDINGS: LUNGS AND PLEURA: Possible very mild interstitial edema, although equivocal. Mild bilateral  lower lobe opacities, likely atelectasis. No pleural effusion. No pneumothorax. HEART AND MEDIASTINUM: Thoracic aortic atherosclerosis. Mild cardiomegaly. BONES AND SOFT TISSUES: No acute osseous abnormality. IMPRESSION: 1. Possible mild interstitial edema, although equivocal. 2. Mild cardiomegaly. 3. Mild bilateral lower lobe opacities, likely atelectasis. Electronically signed by: Pinkie Pebbles MD 06/15/2024 05:45 PM EST RP Workstation: [id number]      IMPRESSION AND PLAN:  Assessment and Plan: * Acute on chronic diastolic CHF (congestive heart failure) (HCC)  -The patient will be admitted to a cardiac telemetry bed. - We will continue diuresis with IV Lasix . - We Will follow serial troponins. - We will follow I's and O's and daily weights. - Cardiology consult be obtained. - I notified [facility] group about the patient.   CAP (community acquired pneumonia) - This is suspected on radiographic studies and the patient has leukocytosis. - Will place her on IV Rocephin  and Zithromax  with bronchodilator therapy as well as mucolytic therapy. - Will follow blood cultures.  Essential hypertension - Will continue antihypertensive therapy.  Type 2 diabetes mellitus without complications (HCC) - The patient will be on supplemental coverage with NovoLog . - Will hold off metformin.  Restless leg syndrome - Will continue ropinirole .  Dyslipidemia - Will continue statin therapy.  Anxiety and depression - Will continue BuSpar  and Lexapro .   DVT prophylaxis: Lovenox .  Advanced Care Planning:  Code Status: full code.  Family Communication:  The plan of care was discussed in details with the patient (and family). I answered all questions. The patient agreed to proceed with the above mentioned plan. Further management will depend upon hospital course. Disposition Plan: Back to previous home environment Consults called: none.  All the records are reviewed and case discussed with ED  provider.  Status is: Inpatient  At the time of the admission, it appears that the appropriate admission status for this patient is inpatient.  This is judged to be reasonable and necessary in order to provide the required intensity of service to ensure the patient's safety given the presenting symptoms, physical exam findings and initial radiographic and laboratory data in the context of comorbid conditions.  The patient requires inpatient status due to high intensity of service, high risk of further deterioration and high frequency of surveillance required.  I certify that at the time of admission, it is my clinical judgment that the patient will require inpatient hospital care extending more  than 2 midnights.                            Dispo: The patient is from: [facility] independent living              Anticipated d/c is to: [facility] independent living              Patient currently is not medically stable to d/c.              Difficult to place patient: No  Madison DELENA Peaches M.D on 06/16/2024 at 12:57 AM  [facility]   From 7 PM-7 AM, contact night-coverage [email or web address]  CC: Primary care physician; Pcp, No     [1]  Allergies Allergen Reactions   Librium  [Chlordiazepoxide ]     Became unresponsive   Toradol  [Ketorolac  Tromethamine ] Shortness Of Breath   Butalbital-Apap-Caffeine  Other (See Comments)    jittery   Demerol  Swelling   Esgic [Butalbital-Apap-Caffeine ] Other (See Comments)    jittery   Iodine  Other (See Comments)    bp bottomed out per pt several hours later; unsure if pre medicated in past with cm; [place] [facility]   Limonene     Other reaction(s): Other (See Comments) jittery   Meperidine  Swelling   Pyridium [Phenazopyridine] Nausea Only

## 2024-06-15 NOTE — ED Provider Notes (Signed)
 [facility] Provider Note    Event Date/Time   First MD Initiated Contact with Patient 06/15/24 1949     (approximate)   History   No chief complaint on file.   HPI  Stacy Dalton is a 73 y.o. female from [place] [facility] with a past medical history of COPD, hypertension, presented to the emergency department via EMS after an unwitnessed fall.  EMS reported that when they arrived the patient had an oxygen saturation of 83% on room air.  She was placed on 4 L which increased her oxygen to 93 to 94%.  Patient does report a headache but she states that she does not recall how she fell.  She denies any neck pain, chest pain, abdominal pain, or abnormal back pain.  She reports that he she is having shortness of breath.     Physical Exam   Triage Vital Signs: ED Triage Vitals  Encounter Vitals Group     BP 06/15/24 1638 (!) 188/80     Girls Systolic BP Percentile --      Girls Diastolic BP Percentile --      Boys Systolic BP Percentile --      Boys Diastolic BP Percentile --      Pulse Rate 06/15/24 1638 83     Resp 06/15/24 1638 16     Temp 06/15/24 1638 99.4 F (37.4 C)     Temp Source 06/15/24 1638 Oral     SpO2 06/15/24 1638 96 %     Weight 06/15/24 1639 153 lb 7 oz (69.6 kg)     Height 06/15/24 2231 5' 1 (1.549 m)     Head Circumference --      Peak Flow --      Pain Score 06/15/24 1639 4     Pain Loc --      Pain Education --      Exclude from Growth Chart --     Most recent vital signs: Vitals:   06/15/24 2007 06/15/24 2117  BP: (!) 179/82 (!) 156/82  Pulse: 76 73  Resp: 15 16  Temp:  98.2 F (36.8 C)  SpO2: 96% 95%     General: Awake, no distress.  CV:  Good peripheral perfusion.  Resp:  Normal effort.  Abd:  No distention.  Other:     ED Results / Procedures / Treatments   Labs (all labs ordered are listed, but only abnormal results are displayed) Labs Reviewed  COMPREHENSIVE METABOLIC PANEL WITH GFR  - Abnormal; Notable for the following components:      Result Value   Glucose, Bld 115 (*)    All other components within normal limits  CBC WITH DIFFERENTIAL/PLATELET - Abnormal; Notable for the following components:   WBC 13.2 (*)    MCH 25.1 (*)    RDW 16.1 (*)    Neutro Abs 10.0 (*)    Monocytes Absolute 1.7 (*)    Abs Immature Granulocytes 0.11 (*)    All other components within normal limits  URINALYSIS, W/ REFLEX TO CULTURE (INFECTION SUSPECTED) - Abnormal; Notable for the following components:   Color, Urine YELLOW (*)    APPearance CLEAR (*)    Protein, ur 100 (*)    Leukocytes,Ua TRACE (*)    Bacteria, UA RARE (*)    All other components within normal limits  PRO BRAIN NATRIURETIC PEPTIDE - Abnormal; Notable for the following components:   Pro Brain Natriuretic Peptide 1,638.0 (*)  All other components within normal limits  TROPONIN T, HIGH SENSITIVITY - Abnormal; Notable for the following components:   Troponin T High Sensitivity 33 (*)    All other components within normal limits  LACTIC ACID, PLASMA  CK  BASIC METABOLIC PANEL WITH GFR  CBC  TROPONIN T, HIGH SENSITIVITY     EKG     RADIOLOGY I have independently reviewed and interpreted the patient's chest x-ray which appears to show pulmonary edema as well as cardiomegaly.  Radiologist interpretation 1. Possible mild interstitial edema, although equivocal. 2. Mild cardiomegaly. 3. Mild bilateral lower lobe opacities, likely atelectasis.  Head CT IMPRESSION: 1. No acute intracranial abnormality.  Cervical spine CT IMPRESSION: 1. No evidence of acute traumatic injury.   PROCEDURES:  Critical Care performed: No  Procedures   MEDICATIONS ORDERED IN ED: Medications  azithromycin  (ZITHROMAX ) 500 mg in sodium chloride  0.9 % 250 mL IVPB (has no administration in time range)  oxyCODONE  (Oxy IR/ROXICODONE ) immediate release tablet 5 mg (has no administration in time range)  amLODipine  (NORVASC )  tablet 10 mg (has no administration in time range)  losartan  (COZAAR ) tablet 25 mg (has no administration in time range)  metoprolol  tartrate (LOPRESSOR ) tablet 12.5 mg (has no administration in time range)  levothyroxine  (SYNTHROID ) tablet 50 mcg (has no administration in time range)  multivitamin with minerals tablet 1 tablet (has no administration in time range)  folic acid  (FOLVITE ) tablet 1 mg (has no administration in time range)  lidocaine  (LIDODERM ) 5 % 1 patch (has no administration in time range)  enoxaparin  (LOVENOX ) injection 40 mg (has no administration in time range)  0.9 %  sodium chloride  infusion (has no administration in time range)  acetaminophen  (TYLENOL ) tablet 650 mg (has no administration in time range)    Or  acetaminophen  (TYLENOL ) suppository 650 mg (has no administration in time range)  traZODone  (DESYREL ) tablet 25 mg (has no administration in time range)  magnesium  hydroxide (MILK OF MAGNESIA) suspension 30 mL (has no administration in time range)  ondansetron  (ZOFRAN ) tablet 4 mg (has no administration in time range)    Or  ondansetron  (ZOFRAN ) injection 4 mg (has no administration in time range)  furosemide  (LASIX ) injection 40 mg (40 mg Intravenous Given 06/15/24 2159)  cefTRIAXone  (ROCEPHIN ) 1 g in sodium chloride  0.9 % 100 mL IVPB (1 g Intravenous New Bag/Given 06/15/24 2159)     IMPRESSION / MDM / ASSESSMENT AND PLAN / ED COURSE  I reviewed the triage vital signs and the nursing notes.                              Differential diagnosis includes, but is not limited to, ICH, metabolic encephalopathy, pneumonia, pulmonary effusions, pneumothorax, pneumonia, UTI  Patient's presentation is most consistent with acute presentation with potential threat to life or bodily function.  Patient is a 73 year old female with past medical history of COPD, hypertension, presenting to the emergency department via EMS after an unwitnessed fall.  She was noted to be  hypoxic by EMS and placed on 4 L nasal cannula which increased her oxygen saturation from 83% to 94%.  Here in the emergency department I was able to wean her down to 2 L with an oxygen saturation of around 93 to 95%.  On her workup here today she is noted to have an elevated BNP as well as findings on her x-ray concerning for pulmonary edema as well as a questionable pneumonia.  Urinalysis shows trace leukocyte esterase and rare bacteria but no white blood cells.  She is also noted to have a slightly elevated white blood cell count.  After discussion with patient she will be treated with IV Lasix  as well as with antibiotics for suspected pneumonia.  Discussed plan for admission and she is agreeable.    Discussed patient's case with the hospitalist who is in agreement with plan for admission.     FINAL CLINICAL IMPRESSION(S) / ED DIAGNOSES   Final diagnoses:  Hypoxia  Pneumonia of both lower lobes due to infectious organism  Acute on chronic congestive heart failure, unspecified heart failure type (HCC)     Rx / DC Orders   ED Discharge Orders     None        Note:  This document was prepared using Dragon voice recognition software and may include unintentional dictation errors.   Rexford Reche HERO, MD 06/15/24 2255

## 2024-06-16 DIAGNOSIS — E785 Hyperlipidemia, unspecified: Secondary | ICD-10-CM

## 2024-06-16 DIAGNOSIS — I10 Essential (primary) hypertension: Secondary | ICD-10-CM

## 2024-06-16 DIAGNOSIS — E119 Type 2 diabetes mellitus without complications: Secondary | ICD-10-CM

## 2024-06-16 DIAGNOSIS — G2581 Restless legs syndrome: Secondary | ICD-10-CM

## 2024-06-16 DIAGNOSIS — J189 Pneumonia, unspecified organism: Secondary | ICD-10-CM

## 2024-06-16 DIAGNOSIS — F419 Anxiety disorder, unspecified: Secondary | ICD-10-CM | POA: Insufficient documentation

## 2024-06-16 LAB — BASIC METABOLIC PANEL WITH GFR
Anion gap: 12 (ref 5–15)
BUN: 12 mg/dL (ref 8–23)
CO2: 34 mmol/L — ABNORMAL HIGH (ref 22–32)
Calcium: 8.9 mg/dL (ref 8.9–10.3)
Chloride: 98 mmol/L (ref 98–111)
Creatinine, Ser: 0.79 mg/dL (ref 0.44–1.00)
GFR, Estimated: >60 mL/min (ref >60)
Glucose, Bld: 136 mg/dL — ABNORMAL HIGH (ref 70–99)
Potassium: 3.5 mmol/L (ref 3.5–5.1)
Sodium: 144 mmol/L (ref 135–145)

## 2024-06-16 LAB — CBC
HCT: 38.7 % (ref 36.0–46.0)
Hemoglobin: 11.9 g/dL — ABNORMAL LOW (ref 12.0–15.0)
MCH: 25.2 pg — ABNORMAL LOW (ref 26.0–34.0)
MCHC: 30.7 g/dL (ref 30.0–36.0)
MCV: 82 fL (ref 80.0–100.0)
Platelets: 257 K/uL (ref 150–400)
RBC: 4.72 MIL/uL (ref 3.87–5.11)
RDW: 15.9 % — ABNORMAL HIGH (ref 11.5–15.5)
WBC: 10.8 K/uL — ABNORMAL HIGH (ref 4.0–10.5)
nRBC: 0 % (ref 0.0–0.2)

## 2024-06-16 LAB — TROPONIN T, HIGH SENSITIVITY: Troponin T High Sensitivity: 34 ng/L — ABNORMAL HIGH (ref 0–19)

## 2024-06-16 MED ORDER — ACETAMINOPHEN 650 MG RE SUPP: 650.0000 mg | Freq: Four times a day (QID) | RECTAL | Status: DC | PRN

## 2024-06-16 MED ORDER — FUROSEMIDE 10 MG/ML IJ SOLN
40.0000 mg | Freq: Two times a day (BID) | INTRAMUSCULAR | Status: DC
  Administered 2024-06-16 – 2024-06-19 (×7): 40 mg via INTRAVENOUS
  Filled 2024-06-16 (×7): qty 4

## 2024-06-16 MED ORDER — ROPINIROLE HCL 0.25 MG PO TABS
0.2500 mg | ORAL_TABLET | Freq: Every day | ORAL | Status: DC
  Administered 2024-06-16 – 2024-06-19 (×4): 0.25 mg via ORAL
  Filled 2024-06-16 (×4): qty 1

## 2024-06-16 MED ORDER — GUAIFENESIN ER 600 MG PO TB12
600.0000 mg | ORAL_TABLET | Freq: Two times a day (BID) | ORAL | Status: DC
  Administered 2024-06-16 – 2024-06-20 (×10): 600 mg via ORAL
  Filled 2024-06-16 (×10): qty 1

## 2024-06-16 MED ORDER — ORAL CARE MOUTH RINSE: 15.0000 mL | OROMUCOSAL | Status: DC | PRN

## 2024-06-16 MED ORDER — ACETAMINOPHEN 325 MG PO TABS
650.0000 mg | ORAL_TABLET | Freq: Four times a day (QID) | ORAL | Status: DC | PRN
  Administered 2024-06-19: 22:00:00 650 mg via ORAL
  Filled 2024-06-16: qty 2

## 2024-06-16 MED ORDER — IPRATROPIUM-ALBUTEROL 0.5-2.5 (3) MG/3ML IN SOLN: 3.0000 mL | RESPIRATORY_TRACT | Status: DC | PRN

## 2024-06-16 MED ORDER — ROSUVASTATIN CALCIUM 5 MG PO TABS
5.0000 mg | ORAL_TABLET | Freq: Every day | ORAL | Status: DC
  Administered 2024-06-16 – 2024-06-20 (×5): 5 mg via ORAL
  Filled 2024-06-16 (×5): qty 1

## 2024-06-16 MED ORDER — HYDRALAZINE HCL 20 MG/ML IJ SOLN: 10.0000 mg | Freq: Four times a day (QID) | INTRAMUSCULAR | Status: DC | PRN

## 2024-06-16 MED ORDER — SODIUM CHLORIDE 0.9 % IV SOLN
500.0000 mg | INTRAVENOUS | Status: DC
  Administered 2024-06-16 – 2024-06-18 (×3): 500 mg via INTRAVENOUS
  Filled 2024-06-16 (×3): qty 5

## 2024-06-16 MED ORDER — SODIUM CHLORIDE 0.9 % IV SOLN
2.0000 g | INTRAVENOUS | Status: AC
  Administered 2024-06-16 – 2024-06-19 (×4): 2 g via INTRAVENOUS
  Filled 2024-06-16 (×4): qty 20

## 2024-06-16 MED ORDER — HYDROCOD POLI-CHLORPHE POLI ER 10-8 MG/5ML PO SUER: 5.0000 mL | Freq: Two times a day (BID) | ORAL | Status: DC | PRN

## 2024-06-16 MED ORDER — SODIUM CHLORIDE 0.9 % IV SOLN
1.0000 g | Freq: Once | INTRAVENOUS | Status: AC
  Administered 2024-06-16: 01:00:00 1 g via INTRAVENOUS
  Filled 2024-06-16: qty 10

## 2024-06-16 MED ORDER — IPRATROPIUM-ALBUTEROL 0.5-2.5 (3) MG/3ML IN SOLN
3.0000 mL | Freq: Four times a day (QID) | RESPIRATORY_TRACT | Status: DC
  Filled 2024-06-16: qty 3

## 2024-06-16 NOTE — Progress Notes (Signed)
°   06/16/24 1000  Spiritual Encounters  Type of Visit Initial  Care provided to: Patient  Conversation partners present during encounter Nurse  Referral source Nurse (RN/NT/LPN)  Reason for visit Routine spiritual support  OnCall Visit Yes  Spiritual Framework  Presenting Themes Meaning/purpose/sources of inspiration;Goals in life/care;Values and beliefs;Coping tools;Impactful experiences and emotions;Rituals and practive  Values/beliefs Pt states she's [employer]al  Patient Stress Factors Health changes;Loss;Loss of control;Other (Comment) (Pt stated she recently lost a brother; Pt has a pug at home that she's anxious to return to and is afraid if she has to move she won't be able to have her dog; which will greatly affect her life.)  Interventions  Spiritual Care Interventions Made Established relationship of care and support;Compassionate presence;Reflective listening;Normalization of emotions;Explored values/beliefs/practices/strengths;Meditation;Prayer;Encouragement  Intervention Outcomes  Outcomes Connection to spiritual care;Awareness around self/spiritual resourses;Awareness of support;Reduced fear;Reduced isolation

## 2024-06-16 NOTE — Hospital Course (Signed)
 73 year old female with history of COPD, depression, HTN, hypothyroidism, migraine and panic attack who came into ED complaining of worsening shortness of breath and cough and wheezing for a week duration. In the ED her blood pressure was 188/80, proBNP was 1638, troponin was 33.  Lactic acid was 1.3 and white count was 13.2.  UA was negative for acute urinary tract infection. Chest x-ray showed interstitial edema. Patient received Lasix , ceftriaxone  and azithromycin  in the emergency room and was admitted for possible new onset of congestive heart failure and possible pneumonia.  12/16: I assumed care of the patient.  Day 3 of antibiotic.  Noted to be 3.0, potassium chloride  40 mEq p.o. one-time dose, potassium chloride  10 mEq IV, 2 doses ordered.  Check BMP in AM.  Recheck magnesium  level in AM.  Complete echo has been ordered and done today.  Pending echo read.  Cardiology is recommending to continue furosemide  twice daily IV.  Will defer fluid management to cardiology.

## 2024-06-16 NOTE — Assessment & Plan Note (Signed)
 Continue home trazodone  25 mg nightly as needed for sleep 12/16: Resumed home Lexapro  20 mg daily, buspirone  7.5 mg p.o. twice daily

## 2024-06-16 NOTE — Progress Notes (Signed)
 Progress Note   Patient: Stacy Dalton FMW:[id number] DOB: [birth year] DOA: 06/15/2024     1 DOS: the patient was seen and examined on 06/16/2024   Brief hospital course: 73 year old female with history of COPD, depression, HTN, hypothyroidism, migraine and panic attack who came into ED complaining of worsening shortness of breath and cough and wheezing for a week duration. In the ED her blood pressure was 188/80, proBNP was 1638, troponin was 33.  Lactic acid was 1.3 and white count was 13.2.  UA was negative for acute urinary tract infection. Chest x-ray showed interstitial edema. Patient received Lasix , ceftriaxone  and azithromycin  in the emergency room and was admitted for possible new onset of congestive heart failure and possible pneumonia.  Assessment and Plan:  73 year old female female w/PMH of COPD not on home oxygen,, HTN, HLD, anxiety/depression, migraine, hypothyroidism, panic disorder presented to ED complaining of acute shortness of breath, wheezing and fever up to 102.  1.  Acute congestive heart failure likely diastolic - She was given Lasix  in the emergency room. - She appears to be volume overloaded and requiring oxygen at this point - She will be continued on Lasix  40 mg IV twice daily - Input output charting, daily weight - Echocardiogram - Cardiology evaluation  2.  Possible community-acquired pneumonia without sepsis - Lactate was normal - She was started on ceftriaxone  and azithromycin  which will be continued for now - Follow-up cultures - She will be continued on cough medications and nebulization  3.  Hypertension - Blood pressure is high and will resume her home medications amlodipine  10 mg, metoprolol  tartrate 12.5 mg twice daily and losartan  25 mg p.o. daily. - She will be given hydralazine  as needed for systolic blood pressure more than 160 - Continue to monitor blood pressure  4.  Type 2 diabetes - She was using metformin at home - Will place her  on sliding scale  5.  COPD not on home oxygen - She will be continued on nebulization - Continue oxygen to maintain saturation more than 90%  6.  Anxiety/depression - She takes trazodone  100 mg at home and 25 mg was started at bedtime which will be continued - She also takes temazepam , buspirone , and Lexapro  - They will be added when she is stable  7.  Hyperlipidemia - She takes Crestor  at home which will be added  8.  Restless leg syndrome - Patient takes ropinirole  at home  9.  Chronic pain syndrome - I see that she was taking tramadol  at home along with Tylenol  - I will add those if needed, patient has a Tylenol .  10.  Hypothyroidism - Continue levothyroxine  50 mcg daily              Subjective: Patient was anxious and complaining of shortness of breath this morning  Physical Exam: Vitals:   06/16/24 0200 06/16/24 0343 06/16/24 0917 06/16/24 1236  BP: 139/70 (!) 166/62 (!) 160/92 (!) 150/62  Pulse: 73 77 78 66  Resp: 15 16 18 17   Temp:  99.1 F (37.3 C) 99.4 F (37.4 C) 100 F (37.8 C)  TempSrc:  Oral  Oral  SpO2: 94% 94% 94% 94%  Weight:      Height:       Constitutional: Alert, awake, calm, comfortable HEENT: Neck supple Respiratory: Bilateral decreased air entry at the bases, she had wheezing and basal rales Cardiovascular: Regular rate and rhythm, no murmurs / rubs / gallops.  1+ bilateral pitting edema. 2+ pedal pulses. No  carotid bruits.  Abdomen: Soft, no tenderness, Bowel sounds positive.  Musculoskeletal: no clubbing / cyanosis. Good ROM, no contractures. Normal muscle tone.  Skin: no rashes, lesions, ulcers. Neurologic: CN 2-12 grossly intact. Sensation intact, No focal deficit identified Psychiatric: Alert and oriented x 3. Normal mood.    Data Reviewed:  Reviewed, white count was 10.8, BUN 12creatinine 0.79  Family Communication: None  Disposition: Status is: Inpatient Remains inpatient appropriate because: Ongoing treatment and  recovery from congestive heart failure, pneumonia requiring oxygen  Planned Discharge Destination: Home    Time spent: 35 minutes  Author: Quamir Willemsen, MD 06/16/2024 3:03 PM  For on call review [email or web address].

## 2024-06-16 NOTE — Assessment & Plan Note (Signed)
 12/16: I discontinued metformin.  Insulin  SSI with at bedtime coverage initiated

## 2024-06-16 NOTE — Assessment & Plan Note (Signed)
 Home rosuvastatin  5 mg daily

## 2024-06-16 NOTE — Consult Note (Signed)
 CARDIOLOGY CONSULT NOTE               Patient ID: KIVA NORLAND MRN: [id number] DOB/AGE: 75-14-1952 75 y.o.  Admit date: 06/15/2024 Referring Physician Dr. Madison Hotter Hospitalist Primary Physician  Primary Cardiologist  Reason for Consultation shortness of breath rapid atrial fibrillation  HPI: Patient is a 73 year old female COPD depression hypertension thyroid  disease anxiety reportedly was found on the floor and weakness fall consideration.She was brought to the emergency room with confusion.  Patient was found to be slightly hypoxic urinary frequency no fever chills or sweats.  Elevated BNP elevated blood pressure.  EKG suggestive of rapid atrial fibrillation cardiology was then consulted for further evaluation  Review of systems complete and found to be negative unless listed above     Past Medical History:  Diagnosis Date   Chronic pain    COPD (chronic obstructive pulmonary disease) (HCC)    told has copd, no current inhaler use   Cough    Depression    ETOH abuse    GERD (gastroesophageal reflux disease)    Hypertension    Hypothyroidism    Insomnia    Migraine    Migraine    Osteopenia    Pain management    Panic attacks    Small bowel obstruction [facility])     Past Surgical History:  Procedure Laterality Date   ABDOMINAL HYSTERECTOMY     BIOPSY  08/29/2021   Procedure: BIOPSY;  Surgeon: Cindie Carlin POUR, DO;  [facility]: [facility];  Service: Endoscopy;;   COLON SURGERY     COLOSTOMY CLOSURE  04/19/2012   Procedure: COLOSTOMY CLOSURE;  Surgeon: Elon CHRISTELLA Pacini, MD;  Location: [facility];  Service: General;  Laterality: N/A;  Laparotomy, Resection and Closure of Colostomy   COLOSTOMY TAKEDOWN N/A 04/12/2016   Procedure: [place] TAKEDOWN;  Surgeon: Donnice Lunger, MD;  Location: [facility];  Service: General;  Laterality: N/A;   ESOPHAGOGASTRODUODENOSCOPY (EGD) WITH PROPOFOL  N/A 08/29/2021   Procedure: ESOPHAGOGASTRODUODENOSCOPY (EGD) WITH PROPOFOL ;  Surgeon:  Cindie Carlin POUR, DO;  Location: [facility];  Service: Endoscopy;  Laterality: N/A;   INCONTINENCE SURGERY     LAPAROTOMY  10/04/2011, colostomy also   Procedure: EXPLORATORY LAPAROTOMY;  Surgeon: Elon CHRISTELLA Pacini, MD;  Location: [facility];  Service: General;  Laterality: N/A;  left partial colectomy with colostomy   LAPAROTOMY  04/19/2012   Procedure: EXPLORATORY LAPAROTOMY;  Surgeon: Elon CHRISTELLA Pacini, MD;  Location: [facility];  Service: General;  Laterality: N/A;   LAPAROTOMY N/A 11/29/2015   Procedure: EXPLORATORY LAPAROTOMY; SUBTOTAL COLECTOMY WITH HARTMAN PROCEDURE AND END COLOSTOMY;  Surgeon: Donnice Lunger, MD;  Location: [facility];  Service: General;  Laterality: N/A;   TUBAL LIGATION     VENTRAL HERNIA REPAIR  04/19/2012   Procedure: HERNIA REPAIR VENTRAL ADULT;  Surgeon: Elon CHRISTELLA Pacini, MD;  Location: [facility];  Service: General;  Laterality: N/A;    Medications Prior to Admission  Medication Sig Dispense Refill Last Dose/Taking   acetaminophen  (TYLENOL ) 325 MG tablet Take 2 tablets (650 mg total) by mouth every 6 (six) hours as needed for mild pain (pain score 1-3), fever or headache (or Fever >/= 101).   Taking As Needed   albuterol  (VENTOLIN  HFA) 108 (90 Base) MCG/ACT inhaler Inhale 2 puffs into the lungs every 4 (four) hours as needed for wheezing or shortness of breath. 18 g 2 Taking As Needed   amLODipine  (NORVASC ) 10 MG tablet Take 1 tablet (10 mg total) by  mouth daily. 90 tablet 3 06/14/2024   ascorbic acid  (VITAMIN C ) 500 MG tablet Take 500 mg by mouth See admin instructions.   Taking   busPIRone  (BUSPAR ) 7.5 MG tablet Take 7.5 mg by mouth 2 (two) times daily.   Taking   escitalopram  (LEXAPRO ) 20 MG tablet Take 20 mg by mouth daily.   Taking   ferrous sulfate  325 (65 FE) MG tablet Take 325 mg by mouth daily with breakfast.   Taking   fluticasone -salmeterol (WIXELA INHUB) 250-50 MCG/ACT AEPB Inhale 1 puff into the lungs 2 (two) times daily as needed.   Taking As Needed   folic acid   (FOLVITE ) 1 MG tablet Take 1 tablet (1 mg total) by mouth daily.   Taking   ipratropium-albuterol  (DUONEB) 0.5-2.5 (3) MG/3ML SOLN Take 3 mLs by nebulization 3 (three) times daily.   Past Month   levothyroxine  (SYNTHROID ) 50 MCG tablet Take 1 tablet (50 mcg total) by mouth daily before breakfast. 90 tablet 2 06/14/2024   losartan  (COZAAR ) 25 MG tablet Take 1 tablet (25 mg total) by mouth daily.   06/14/2024   metFORMIN (GLUCOPHAGE-XR) 500 MG 24 hr tablet Take 500 mg by mouth daily with supper.   Taking   metoprolol  tartrate (LOPRESSOR ) 25 MG tablet Take 0.5 tablets (12.5 mg total) by mouth 2 (two) times daily.   06/14/2024 Evening   Multiple Vitamin (MULTIVITAMIN WITH MINERALS) TABS tablet Take 1 tablet by mouth daily.   Past Week   ondansetron  (ZOFRAN ) 4 MG tablet Take 4 mg by mouth every 8 (eight) hours as needed.   Taking As Needed   rOPINIRole  (REQUIP ) 0.25 MG tablet Take 0.25 mg by mouth at bedtime.   Taking   rosuvastatin  (CRESTOR ) 5 MG tablet Take 5 mg by mouth daily.   Taking   temazepam  (RESTORIL ) 15 MG capsule Take 15 mg by mouth at bedtime as needed.   Taking As Needed   thiamine  (VITAMIN B-1) 100 MG tablet Take 1 tablet (100 mg total) by mouth daily.   Past Week   traMADol  (ULTRAM ) 50 MG tablet Take 50 mg by mouth 2 (two) times daily.   Taking   traZODone  (DESYREL ) 100 MG tablet Take 100 mg by mouth at bedtime.   Taking   Vitamin D , Ergocalciferol , (DRISDOL ) 1.25 MG (50000 UNIT) CAPS capsule Take 50,000 Units by mouth once a week.   Taking   Aspirin -Caffeine  (BC FAST PAIN RELIEF) 845-65 MG PACK Take 1 packet by mouth every 8 (eight) hours as needed.      lidocaine  (LIDODERM ) 5 % Place 1 patch onto the skin daily. Remove & Discard patch within 12 hours or as directed by MD (Patient not taking: Reported on 06/15/2024)   Not Taking   oxyCODONE  (OXY IR/ROXICODONE ) 5 MG immediate release tablet Take 1 tablet (5 mg total) by mouth every 6 (six) hours as needed for severe pain (pain score  7-10). (Patient not taking: Reported on 06/15/2024) 15 tablet 0 Not Taking   Social History   Socioeconomic History   Marital status: Widowed    Spouse name: Not on file   Number of children: 5   Years of education: 8   Highest education level: 8th grade  Occupational History   Occupation: Retired  Tobacco Use   Smoking status: Former    Current packs/day: 0.00    Average packs/day: 1.5 packs/day for 20.0 years (30.0 ttl pk-yrs)    Types: Cigarettes    Start date: 10/04/1978    Quit date:  10/04/1998    Years since quitting: 25.7   Smokeless tobacco: Never  Vaping Use   Vaping status: Never Used  Substance and Sexual Activity   Alcohol  use: Yes    Alcohol /week: 3.0 standard drinks of alcohol     Types: 3 Shots of liquor per week    Comment: Daily   Drug use: No   Sexual activity: Yes  Other Topics Concern   Not on file  Social History Narrative   Pt lives alone. States 2 children live near by.   Social Drivers of Health   Tobacco Use: Medium Risk (06/03/2024)   [facility] from [facility]   Patient History    Smoking Tobacco Use: Former    Smokeless Tobacco Use: Never    Passive Exposure: Past  Physicist, Medical Strain: Not on file  Food Insecurity: No Food Insecurity (06/16/2024)   Epic    Worried About Programme Researcher, Broadcasting/film/video in the Last Year: Never true    Ran Out of Food in the Last Year: Never true  Transportation Needs: No Transportation Needs (06/16/2024)   Epic    Lack of Transportation (Medical): No    Lack of Transportation (Non-Medical): No  Physical Activity: Not on file  Stress: Not on file  Social Connections: Socially Isolated (06/16/2024)   Social Connection and Isolation Panel    Frequency of Communication with Friends and Family: Three times a week    Frequency of Social Gatherings with Friends and Family: Three times a week    Attends Religious Services: Never    Active Member of Clubs or Organizations: No    Attends Tax Inspector Meetings: Never    Marital Status: Widowed  Intimate Partner Violence: Not At Risk (06/16/2024)   Epic    Fear of Current or Ex-Partner: No    Emotionally Abused: No    Physically Abused: No    Sexually Abused: No  Depression (PHQ2-9): Not on file  Alcohol  Screen: Not on file  Housing: Low Risk (06/16/2024)   Epic    Unable to Pay for Housing in the Last Year: No    Number of Times Moved in the Last Year: 1    Homeless in the Last Year: No  Utilities: Not At Risk (06/16/2024)   Epic    Threatened with loss of utilities: No  Health Literacy: Not on file    Family History  Problem Relation Age of Onset   Heart disease Mother    Hypertension Mother    Cancer Sister        sinus   Diabetes Brother    Heart disease Brother    Thyroid  disease Brother    Cancer Sister        abdominal ?   Thyroid  disease Sister    Cancer Other        GE junction adenocarcinoma   Emphysema Father    Stroke Father    Hypertension Father    Heart disease Father    COPD Sister    Thyroid  disease Sister    Thyroid  disease Sister    Diabetes Brother    Thyroid  disease Brother    Thyroid  disease Brother    Hypertension Brother    Post-traumatic stress disorder Brother    COPD Brother    Thyroid  disease Brother    Thyroid  disease Brother    Hypertension Brother    Transient ischemic attack Brother       Review of systems complete and found to  be negative unless listed above      PHYSICAL EXAM  General: Well developed, well nourished, in no acute distress HEENT:  Normocephalic and atramatic Neck:  No JVD.  Lungs: Clear bilaterally to auscultation and percussion. Heart: Rapid tachycardic. Normal S1 and S2 without gallops or murmurs.  Abdomen: Bowel sounds are positive, abdomen soft and non-tender  Msk:  Back normal, normal gait. Normal strength and tone for age. Extremities: No clubbing, cyanosis or edema.   Neuro: Alert and disoriented confused Psych:  Good affect,  responds appropriately  Labs:   Lab Results  Component Value Date   WBC 10.8 (H) 06/16/2024   HGB 11.9 (L) 06/16/2024   HCT 38.7 06/16/2024   MCV 82.0 06/16/2024   PLT 257 06/16/2024    Recent Labs  Lab 06/15/24 1649 06/16/24 0357  NA 142 144  K 3.9 3.5  CL 102 98  CO2 27 34*  BUN 20 12  CREATININE 0.88 0.79  CALCIUM  9.7 8.9  PROT 7.7  --   BILITOT 0.3  --   ALKPHOS 115  --   ALT 16  --   AST 30  --   GLUCOSE 115* 136*   Lab Results  Component Value Date   CKTOTAL 221 06/15/2024   TROPONINI <0.03 07/20/2018    Lab Results  Component Value Date   CHOL 207 (H) 06/21/2020   CHOL 289 (H) 04/03/2019   CHOL 209 (H) 05/21/2018   Lab Results  Component Value Date   HDL 75 06/21/2020   HDL 63 04/03/2019   HDL 74 05/21/2018   Lab Results  Component Value Date   LDLCALC 77 06/21/2020   LDLCALC 187 (H) 04/03/2019   LDLCALC 123 (H) 05/21/2018   Lab Results  Component Value Date   TRIG 350 (H) 06/21/2020   TRIG 208 (H) 04/03/2019   TRIG 61 05/21/2018   Lab Results  Component Value Date   CHOLHDL 2.8 06/21/2020   CHOLHDL 4.6 (H) 04/03/2019   CHOLHDL 2.8 05/21/2018   No results found for: LDLDIRECT    Radiology: CT Cervical Spine Wo Contrast Result Date: 06/15/2024 EXAM: CT CERVICAL SPINE WITHOUT CONTRAST 06/15/2024 05:19:08 PM TECHNIQUE: CT of the cervical spine was performed without the administration of intravenous contrast. Multiplanar reformatted images are provided for review. Automated exposure control, iterative reconstruction, and/or weight based adjustment of the mA/kV was utilized to reduce the radiation dose to as low as reasonably achievable. COMPARISON: None available. CLINICAL HISTORY: Neck trauma (Age >= 65y) FINDINGS: BONES AND ALIGNMENT: No acute fracture or traumatic malalignment. DEGENERATIVE CHANGES: Mild degenerative changes of the mid / lower cervical spine. SOFT TISSUES: No prevertebral soft tissue swelling. LIMITATIONS/ARTIFACTS: Motion  degraded images. IMPRESSION: 1. No evidence of acute traumatic injury. Electronically signed by: Pinkie Pebbles MD 06/15/2024 05:49 PM EST RP Workstation: [id number]   CT Head Wo Contrast Result Date: 06/15/2024 EXAM: CT HEAD WITHOUT 06/15/2024 05:19:08 PM TECHNIQUE: CT of the head was performed without the administration of intravenous contrast. Automated exposure control, iterative reconstruction, and/or weight based adjustment of the mA/kV was utilized to reduce the radiation dose to as low as reasonably achievable. COMPARISON: 03/24/2022 CLINICAL HISTORY: Head trauma, minor (Age >= 65y) FINDINGS: BRAIN AND VENTRICLES: No acute intracranial hemorrhage. No mass effect or midline shift. No extra-axial fluid collection. No evidence of acute infarct. No hydrocephalus. ORBITS: No acute abnormality. SINUSES AND MASTOIDS: No acute abnormality. SOFT TISSUES AND SKULL: No acute skull fracture. No acute soft tissue abnormality. IMPRESSION: 1. No acute  intracranial abnormality. Electronically signed by: Pinkie Pebbles MD 06/15/2024 05:48 PM EST RP Workstation: [id number]   DG Chest 2 View Result Date: 06/15/2024 EXAM: 2 VIEW(S) XRAY OF THE CHEST 06/15/2024 05:00:00 PM COMPARISON: 09/10/2023 CLINICAL HISTORY: Hypoxia FINDINGS: LUNGS AND PLEURA: Possible very mild interstitial edema, although equivocal. Mild bilateral lower lobe opacities, likely atelectasis. No pleural effusion. No pneumothorax. HEART AND MEDIASTINUM: Thoracic aortic atherosclerosis. Mild cardiomegaly. BONES AND SOFT TISSUES: No acute osseous abnormality. IMPRESSION: 1. Possible mild interstitial edema, although equivocal. 2. Mild cardiomegaly. 3. Mild bilateral lower lobe opacities, likely atelectasis. Electronically signed by: Pinkie Pebbles MD 06/15/2024 05:45 PM EST RP Workstation: [id number]    EKG: Rapid atrial fibrillation rate of 150 narrow complex possibly atrial flutter  ASSESSMENT AND PLAN:  Acute on chronic diastolic  congestive heart failure Shortness of breath Altered mental status Hypertension Community-acquired pneumonia Hyperlipidemia Diabetes . Agree with admit for management and treatment of acute on chronic diastolic heart failure admit to telemetry follow-up EKGs troponins Echocardiogram for assessment of left ventricular function wall motion and valvular structures IV diuresis with Lasix  supplemental oxygen as necessary inhalers as necessary Hypertension management will try to maintain blood pressure goal of around less than 130/80 Diabetes management to control currently on NovoLog  will hold metformin for now Statin therapy should be continued for hyperlipidemia Agree with proximal posterior metabolic therapy for possible pneumonia Recommend conservative cardiac management for now  Signed: Cara JONETTA Lovelace MD, 06/16/2024, 11:29 PM

## 2024-06-16 NOTE — Assessment & Plan Note (Signed)
-   Will continue antihypertensive therapy.

## 2024-06-16 NOTE — Assessment & Plan Note (Signed)
-   This is suspected on radiographic studies and the patient has leukocytosis. - Will place her on IV Rocephin  and Zithromax  with bronchodilator therapy as well as mucolytic therapy. - Will follow blood cultures.  12/16: Day 3 of antibiotic: Azithromycin  500 mg IV daily, ceftriaxone  2 g IV daily

## 2024-06-16 NOTE — Assessment & Plan Note (Signed)
 From [facility]: ' -The patient will be admitted to a cardiac telemetry bed. - We will continue diuresis with IV Lasix . - We Will follow serial troponins. - We will follow I's and O's and daily weights. - Cardiology consult be obtained. - I notified [facility] group about the patient.'  12/16: Per cardiology recommend to continue IV furosemide  40 mg twice daily at this time.  strict I's and O's.

## 2024-06-16 NOTE — Assessment & Plan Note (Signed)
Will continue ropinirole.

## 2024-06-17 ENCOUNTER — Inpatient Hospital Stay (HOSPITAL_COMMUNITY)
Admit: 2024-06-17 | Discharge: 2024-06-17 | Disposition: A | Discharge status: Home / Self Care | Attending: Hospitalist | Admitting: Hospitalist

## 2024-06-17 DIAGNOSIS — I5033 Acute on chronic diastolic (congestive) heart failure: Secondary | ICD-10-CM

## 2024-06-17 LAB — CBC
HCT: 41.4 % (ref 36.0–46.0)
Hemoglobin: 12.6 g/dL (ref 12.0–15.0)
MCH: 24.9 pg — ABNORMAL LOW (ref 26.0–34.0)
MCHC: 30.4 g/dL (ref 30.0–36.0)
MCV: 81.7 fL (ref 80.0–100.0)
Platelets: 300 K/uL (ref 150–400)
RBC: 5.07 MIL/uL (ref 3.87–5.11)
RDW: 16 % — ABNORMAL HIGH (ref 11.5–15.5)
WBC: 8.4 K/uL (ref 4.0–10.5)
nRBC: 0 % (ref 0.0–0.2)

## 2024-06-17 LAB — ECHOCARDIOGRAM COMPLETE
AR max vel: 1.79 cm2
AV Area VTI: 1.77 cm2
AV Area mean vel: 1.72 cm2
AV Mean grad: 5.9 mmHg
AV Peak grad: 11 mmHg
Ao pk vel: 1.66 m/s
Area-P 1/2: 3.54 cm2
Height: 61 in
S' Lateral: 2.7 cm
Weight: 2455.04 [oz_av]

## 2024-06-17 LAB — COMPREHENSIVE METABOLIC PANEL WITH GFR
ALT: 16 U/L (ref 0–44)
AST: 18 U/L (ref 15–41)
Albumin: 4.2 g/dL (ref 3.5–5.0)
Alkaline Phosphatase: 101 U/L (ref 38–126)
Anion gap: 12 (ref 5–15)
BUN: 14 mg/dL (ref 8–23)
CO2: 38 mmol/L — ABNORMAL HIGH (ref 22–32)
Calcium: 9 mg/dL (ref 8.9–10.3)
Chloride: 95 mmol/L — ABNORMAL LOW (ref 98–111)
Creatinine, Ser: 0.96 mg/dL (ref 0.44–1.00)
GFR, Estimated: >60 mL/min (ref >60)
Glucose, Bld: 105 mg/dL — ABNORMAL HIGH (ref 70–99)
Potassium: 3 mmol/L — ABNORMAL LOW (ref 3.5–5.1)
Sodium: 145 mmol/L (ref 135–145)
Total Bilirubin: 0.4 mg/dL (ref 0.0–1.2)
Total Protein: 7 g/dL (ref 6.5–8.1)

## 2024-06-17 LAB — MAGNESIUM: Magnesium: 1.8 mg/dL (ref 1.7–2.4)

## 2024-06-17 LAB — GLUCOSE, CAPILLARY: Glucose-Capillary: 105 mg/dL — ABNORMAL HIGH (ref 70–99)

## 2024-06-17 MED ORDER — HYDRALAZINE HCL 20 MG/ML IJ SOLN: 5.0000 mg | INTRAMUSCULAR | Status: DC | PRN

## 2024-06-17 MED ORDER — ESCITALOPRAM OXALATE 10 MG PO TABS
20.0000 mg | ORAL_TABLET | Freq: Every day | ORAL | Status: DC
  Administered 2024-06-18 – 2024-06-20 (×3): 20 mg via ORAL
  Filled 2024-06-17 (×3): qty 2

## 2024-06-17 MED ORDER — SACCHAROMYCES BOULARDII 250 MG PO CAPS
250.0000 mg | ORAL_CAPSULE | Freq: Two times a day (BID) | ORAL | Status: AC
  Administered 2024-06-18 (×3): 250 mg via ORAL
  Filled 2024-06-17 (×3): qty 1

## 2024-06-17 MED ORDER — BUSPIRONE HCL 15 MG PO TABS
7.5000 mg | ORAL_TABLET | Freq: Two times a day (BID) | ORAL | Status: DC
  Administered 2024-06-17 – 2024-06-20 (×6): 7.5 mg via ORAL
  Filled 2024-06-17 (×6): qty 1

## 2024-06-17 MED ORDER — LOPERAMIDE HCL 2 MG PO CAPS
2.0000 mg | ORAL_CAPSULE | Freq: Once | ORAL | Status: AC
  Administered 2024-06-17: 21:00:00 2 mg via ORAL
  Filled 2024-06-17: qty 1

## 2024-06-17 MED ORDER — INSULIN ASPART 100 UNIT/ML IJ SOLN
0.0000 [IU] | Freq: Three times a day (TID) | INTRAMUSCULAR | Status: DC
  Administered 2024-06-19 – 2024-06-20 (×2): 2 [IU] via SUBCUTANEOUS
  Filled 2024-06-17 (×2): qty 2

## 2024-06-17 MED ORDER — POTASSIUM CHLORIDE CRYS ER 20 MEQ PO TBCR
40.0000 meq | EXTENDED_RELEASE_TABLET | Freq: Once | ORAL | Status: AC
  Administered 2024-06-17: 16:00:00 40 meq via ORAL
  Filled 2024-06-17: qty 2

## 2024-06-17 MED ORDER — POTASSIUM CHLORIDE 10 MEQ/100ML IV SOLN
10.0000 meq | INTRAVENOUS | Status: AC
  Administered 2024-06-17 (×2): 10 meq via INTRAVENOUS
  Filled 2024-06-17 (×2): qty 100

## 2024-06-17 MED ORDER — INSULIN ASPART 100 UNIT/ML IJ SOLN: 0.0000 [IU] | Freq: Every day | INTRAMUSCULAR | Status: DC

## 2024-06-17 NOTE — Care Management Important Message (Signed)
 Important Message  Patient Details  Name: Stacy Dalton MRN: [id number] Date of Birth: 02-Jul-1951   Important Message Given:  Yes - Medicare IM     Rojelio SHAUNNA Rattler 06/17/2024, 2:52 PM

## 2024-06-17 NOTE — Progress Notes (Signed)
 Heart Failure Navigator Progress Note  Assessed for Heart & Vascular TOC clinic readiness.  Patient does not meet criteria due to current [facility] Cardiology patient.   Navigator will sign off at this time.  Roxy Horseman, RN, BSN [facility] Heart Failure Navigator Secure Chat Only

## 2024-06-17 NOTE — Plan of Care (Signed)

## 2024-06-17 NOTE — Progress Notes (Addendum)
 PROGRESS NOTE  Stacy Dalton  FMW:[id number] DOB: [birth year] DOA: 06/15/2024 PCP: Pcp, No   73 year old female with history of COPD, depression, HTN, hypothyroidism, migraine and panic attack who came into ED complaining of worsening shortness of breath and cough and wheezing for a week duration. In the ED her blood pressure was 188/80, proBNP was 1638, troponin was 33.  Lactic acid was 1.3 and white count was 13.2.  UA was negative for acute urinary tract infection. Chest x-ray showed interstitial edema. Patient received Lasix , ceftriaxone  and azithromycin  in the emergency room and was admitted for possible new onset of congestive heart failure and possible pneumonia.  12/16: I assumed care of the patient.  Day 3 of antibiotic.  Noted to be 3.0, potassium chloride  40 mEq p.o. one-time dose, potassium chloride  10 mEq IV, 2 doses ordered.  Check BMP in AM.  Recheck magnesium  level in AM.  Complete echo has been ordered and done today.  Pending echo read.  Cardiology is recommending to continue furosemide  twice daily IV.  Will defer fluid management to cardiology.  Assessment & Plan:   Principal Problem:   Acute on chronic diastolic CHF (congestive heart failure) (HCC) Active Problems:   Essential hypertension   Type 2 diabetes mellitus without complications (HCC)   Restless leg syndrome   Dyslipidemia   CAP (community acquired pneumonia)   Anxiety and depression   Assessment and Plan:  * Acute on chronic diastolic CHF (congestive heart failure) (HCC) From [facility]: ' -The patient will be admitted to a cardiac telemetry bed. - We will continue diuresis with IV Lasix . - We Will follow serial troponins. - We will follow I's and O's and daily weights. - Cardiology consult be obtained. - I notified [facility] group about the patient.'  12/16: Per cardiology recommend to continue IV furosemide  40 mg twice daily at this time.  strict I's and O's.  Restless leg syndrome Home ropinirole  0.25 mg  nightly  Type 2 diabetes mellitus without complications (HCC) 12/16: I discontinued metformin.  Insulin  SSI with at bedtime coverage initiated  Essential hypertension - Will continue antihypertensive therapy. Hydralazine  5 mg IV every 4 hours as needed for SBP > 160, 5 days  Dyslipidemia Home rosuvastatin  5 mg daily  CAP (community acquired pneumonia) - This is suspected on radiographic studies and the patient has leukocytosis. - Will place her on IV Rocephin  and Zithromax  with bronchodilator therapy as well as mucolytic therapy. - Will follow blood cultures.  12/16: Day 3 of antibiotic: Azithromycin  500 mg IV daily, ceftriaxone  2 g IV daily  Anxiety and depression Continue home trazodone  25 mg nightly as needed for sleep 12/16: Resumed home Lexapro  20 mg daily, buspirone  7.5 mg p.o. twice daily  DVT prophylaxis:  Code Status:  Family Communication:  Disposition Plan:  Level of care: Telemetry  Consultants:  Cardiology  Procedures:  None at this time  Antimicrobials: None at this time  Subjective:  At bedside, patient was able to tell me her first last name, age, location, current calendar year.  She does not appear to be in acute distress.  Discussed with her that cardiology would like her to stay as a more for fluid management.  She endorses understanding and in agreement.  Objective: Vitals:   06/17/24 0436 06/17/24 0742 06/17/24 1130 06/17/24 1539  BP: 124/62 134/67 130/62 130/75  Pulse: 70 72 66 67  Resp: 17 15  10   Temp: 98.2 F (36.8 C) 97.8 F (36.6 C) 98.7 F (37.1 C) 98.6  F (37 C)  TempSrc:  Oral  Oral  SpO2: 96% 91% 93% 94%  Weight:      Height:        Intake/Output Summary (Last 24 hours) at 06/17/2024 1729 Last data filed at 06/17/2024 1300 Gross per 24 hour  Intake 0 ml  Output --  Net 0 ml   Filed Weights   06/15/24 1639  Weight: 69.6 kg   Examination:  General exam: Appears calm and comfortable  Respiratory system: Clear to  auscultation. Respiratory effort normal. Cardiovascular system: S1 & S2 heard, RRR. No JVD, murmurs, rubs, gallops or clicks. No pedal edema. Gastrointestinal system: Abdomen is nondistended, soft and nontender. No organomegaly or masses felt. Normal bowel sounds heard. Central nervous system: Alert and oriented. No focal neurological deficits. Extremities: Symmetric 5 x 5 power. Skin: No rashes, lesions or ulcers Psychiatry: Judgement and insight appear normal. Mood & affect appropriate.   Data Reviewed: I have personally reviewed following labs and imaging studies  CBC: Recent Labs  Lab 06/15/24 1649 06/16/24 0357 06/17/24 0445  WBC 13.2* 10.8* 8.4  NEUTROABS 10.0*  --   --   HGB 12.6 11.9* 12.6  HCT 41.6 38.7 41.4  MCV 83.0 82.0 81.7  PLT 290 257 300   Basic Metabolic Panel: Recent Labs  Lab 06/15/24 1649 06/16/24 0357 06/17/24 0445  NA 142 144 145  K 3.9 3.5 3.0*  CL 102 98 95*  CO2 27 34* 38*  GLUCOSE 115* 136* 105*  BUN 20 12 14   CREATININE 0.88 0.79 0.96  CALCIUM  9.7 8.9 9.0  MG  --   --  1.8   GFR: Estimated Creatinine Clearance: 45.2 mL/min (by C-G formula based on SCr of 0.96 mg/dL).  Liver Function Tests: Recent Labs  Lab 06/15/24 1649 06/17/24 0445  AST 30 18  ALT 16 16  ALKPHOS 115 101  BILITOT 0.3 0.4  PROT 7.7 7.0  ALBUMIN 4.9 4.2   Cardiac Enzymes: Recent Labs  Lab 06/15/24 2156  CKTOTAL 221   BNP (last 3 results) Recent Labs    06/15/24 2028  PROBNP 1,638.0*   Sepsis Labs: Recent Labs  Lab 06/15/24 1649  LATICACIDVEN 1.3   Scheduled Meds:  amLODipine   10 mg Oral Daily   busPIRone   7.5 mg Oral BID   enoxaparin  (LOVENOX ) injection  40 mg Subcutaneous Q24H   [START ON 06/18/2024] escitalopram   20 mg Oral Daily   folic acid   1 mg Oral Daily   furosemide   40 mg Intravenous BID   guaiFENesin   600 mg Oral BID   [START ON 06/18/2024] insulin  aspart  0-15 Units Subcutaneous TID WC   insulin  aspart  0-5 Units Subcutaneous QHS    levothyroxine   50 mcg Oral Q0600   losartan   25 mg Oral Daily   metoprolol  tartrate  12.5 mg Oral BID   multivitamin with minerals  1 tablet Oral Daily   rOPINIRole   0.25 mg Oral QHS   rosuvastatin   5 mg Oral Daily   Continuous Infusions:  azithromycin  500 mg (06/16/24 2314)   cefTRIAXone  (ROCEPHIN )  IV 2 g (06/16/24 2203)   potassium chloride  10 mEq (06/17/24 1712)    LOS: 2 days   Time spent: 50-minute  Dr. Sherre [facility] If 7PM-7AM, please contact night-coverage 06/17/2024, 5:29 PM

## 2024-06-17 NOTE — Progress Notes (Signed)
 [facility] CLINIC CARDIOLOGY PROGRESS NOTE   Patient ID: Stacy Dalton MRN: [id number] DOB/AGE: 1950-12-19 73 y.o.  Admit date: 06/15/2024 Referring Physician Dr. Madison Peaches Primary Physician Pcp, No  Primary Cardiologist None Reason for Consultation AoCHF  HPI: Stacy Dalton is a 73 y.o. female with a past medical history of COPD, depression, HTN, hypothyroidism, migraines who presented to the ED on 06/15/2024 for SOB, cough, wheezing. Concern for acute heart failure in addition to possible pneumonia. Cardiology was consulted for further evaluation.   Interval History:  -Patient seen and examined this AM, resting in bed.  -States she feels about the same today. BP and HR stable.  -Endorses good UOP with IV lasix , renal function stable.   Review of systems complete and found to be negative unless listed above   Vitals:   06/16/24 1947 06/17/24 0046 06/17/24 0436 06/17/24 0742  BP: (!) 133/59 110/64 124/62 134/67  Pulse: 72 73 70 72  Resp: 11 17 17 15   Temp: 100 F (37.8 C) 98.6 F (37 C) 98.2 F (36.8 C) 97.8 F (36.6 C)  TempSrc: Oral   Oral  SpO2: 93% 96% 96% 91%  Weight:      Height:         Intake/Output Summary (Last 24 hours) at 06/17/2024 9187 Last data filed at 06/16/2024 1255 Gross per 24 hour  Intake 348.33 ml  Output 850 ml  Net -501.67 ml     PHYSICAL EXAM General: Chronically ill appearing elderly female, well nourished, in no acute distress. HEENT: Normocephalic and atraumatic. Neck: No JVD.  Lungs: Normal respiratory effort on 2L [place]. Clear bilaterally to auscultation. No wheezes, crackles, rhonchi.  Heart: HRRR. Normal S1 and S2 without gallops or murmurs. Radial & DP pulses 2+ bilaterally. Abdomen: Non-distended appearing.  Msk: Normal strength and tone for age. Extremities: No clubbing, cyanosis or edema.   Neuro: Alert and oriented X 3. Psych: Mood appropriate, affect congruent.    LABS: Basic Metabolic Panel: Recent Labs     06/16/24 0357 06/17/24 0445  NA 144 145  K 3.5 3.0*  CL 98 95*  CO2 34* 38*  GLUCOSE 136* 105*  BUN 12 14  CREATININE 0.79 0.96  CALCIUM  8.9 9.0  MG  --  1.8   Liver Function Tests: Recent Labs    06/15/24 1649 06/17/24 0445  AST 30 18  ALT 16 16  ALKPHOS 115 101  BILITOT 0.3 0.4  PROT 7.7 7.0  ALBUMIN 4.9 4.2   No results for input(s): LIPASE, AMYLASE in the last 72 hours. CBC: Recent Labs    06/15/24 1649 06/16/24 0357 06/17/24 0445  WBC 13.2* 10.8* 8.4  NEUTROABS 10.0*  --   --   HGB 12.6 11.9* 12.6  HCT 41.6 38.7 41.4  MCV 83.0 82.0 81.7  PLT 290 257 300   Cardiac Enzymes: Recent Labs    06/15/24 2156  CKTOTAL 221   BNP: No results for input(s): BNP in the last 72 hours. D-Dimer: No results for input(s): DDIMER in the last 72 hours. Hemoglobin A1C: No results for input(s): HGBA1C in the last 72 hours. Fasting Lipid Panel: No results for input(s): CHOL, HDL, LDLCALC, TRIG, CHOLHDL, LDLDIRECT in the last 72 hours. Thyroid  Function Tests: No results for input(s): TSH, T4TOTAL, T3FREE, THYROIDAB in the last 72 hours.  Invalid input(s): FREET3 Anemia Panel: No results for input(s): VITAMINB12, FOLATE, FERRITIN, TIBC, IRON, RETICCTPCT in the last 72 hours.  CT Cervical Spine Wo Contrast Result Date: 06/15/2024 EXAM:  CT CERVICAL SPINE WITHOUT CONTRAST 06/15/2024 05:19:08 PM TECHNIQUE: CT of the cervical spine was performed without the administration of intravenous contrast. Multiplanar reformatted images are provided for review. Automated exposure control, iterative reconstruction, and/or weight based adjustment of the mA/kV was utilized to reduce the radiation dose to as low as reasonably achievable. COMPARISON: None available. CLINICAL HISTORY: Neck trauma (Age >= 65y) FINDINGS: BONES AND ALIGNMENT: No acute fracture or traumatic malalignment. DEGENERATIVE CHANGES: Mild degenerative changes of the mid / lower  cervical spine. SOFT TISSUES: No prevertebral soft tissue swelling. LIMITATIONS/ARTIFACTS: Motion degraded images. IMPRESSION: 1. No evidence of acute traumatic injury. Electronically signed by: Pinkie Pebbles MD 06/15/2024 05:49 PM EST RP Workstation: [id number]   CT Head Wo Contrast Result Date: 06/15/2024 EXAM: CT HEAD WITHOUT 06/15/2024 05:19:08 PM TECHNIQUE: CT of the head was performed without the administration of intravenous contrast. Automated exposure control, iterative reconstruction, and/or weight based adjustment of the mA/kV was utilized to reduce the radiation dose to as low as reasonably achievable. COMPARISON: 03/24/2022 CLINICAL HISTORY: Head trauma, minor (Age >= 65y) FINDINGS: BRAIN AND VENTRICLES: No acute intracranial hemorrhage. No mass effect or midline shift. No extra-axial fluid collection. No evidence of acute infarct. No hydrocephalus. ORBITS: No acute abnormality. SINUSES AND MASTOIDS: No acute abnormality. SOFT TISSUES AND SKULL: No acute skull fracture. No acute soft tissue abnormality. IMPRESSION: 1. No acute intracranial abnormality. Electronically signed by: Pinkie Pebbles MD 06/15/2024 05:48 PM EST RP Workstation: [id number]   DG Chest 2 View Result Date: 06/15/2024 EXAM: 2 VIEW(S) XRAY OF THE CHEST 06/15/2024 05:00:00 PM COMPARISON: 09/10/2023 CLINICAL HISTORY: Hypoxia FINDINGS: LUNGS AND PLEURA: Possible very mild interstitial edema, although equivocal. Mild bilateral lower lobe opacities, likely atelectasis. No pleural effusion. No pneumothorax. HEART AND MEDIASTINUM: Thoracic aortic atherosclerosis. Mild cardiomegaly. BONES AND SOFT TISSUES: No acute osseous abnormality. IMPRESSION: 1. Possible mild interstitial edema, although equivocal. 2. Mild cardiomegaly. 3. Mild bilateral lower lobe opacities, likely atelectasis. Electronically signed by: Pinkie Pebbles MD 06/15/2024 05:45 PM EST RP Workstation: [id number]     ECHO ordered  TELEMETRY (personally  reviewed): sinus rhythm rate 60s  EKG (personally reviewed): NSR rate 84 bpm  DATA reviewed by me 06/17/2024: last 24h vitals tele labs imaging I/O, hospitalist progress note  Principal Problem:   Acute on chronic diastolic CHF (congestive heart failure) (HCC) Active Problems:   CAP (community acquired pneumonia)   Essential hypertension   Anxiety and depression   Type 2 diabetes mellitus without complications (HCC)   Dyslipidemia   Restless leg syndrome    ASSESSMENT AND PLAN: Stacy Dalton is a 73 y.o. female with a past medical history of COPD, depression, HTN, hypothyroidism, migraines who presented to the ED on 06/15/2024 for SOB, cough, wheezing. Concern for acute heart failure in addition to possible pneumonia. Cardiology was consulted for further evaluation.   # Suspected acute HFpEF # Possible pneumonia # COPD # Hypertension Patient presented with worsening SOB, cough, wheezing. BNP elevated at 1600. Concern for both acute heart failure and pneumonia based on labs, imaging. Started on IV lasix  and IV antibiotics in the ED.  -Echo ordered, further recommendations pending these results.  -Continue IV lasix  40 mg BID.  -Continue losartan  25 mg daily, metoprolol  tartrate 12.5 mg twice daily, amlodipine  10 mg daily.  -Continue crestor  5 mg daily.   This patient's case was discussed and created with Dr. Florencio and he is in agreement.  Signed:  Danita Bloch, PA-C  06/17/2024, 8:12 AM [facility] Cardiology

## 2024-06-18 ENCOUNTER — Telehealth (HOSPITAL_COMMUNITY): Payer: Self-pay

## 2024-06-18 ENCOUNTER — Other Ambulatory Visit (HOSPITAL_COMMUNITY): Payer: Self-pay

## 2024-06-18 DIAGNOSIS — I5033 Acute on chronic diastolic (congestive) heart failure: Secondary | ICD-10-CM | POA: Diagnosis not present

## 2024-06-18 LAB — BASIC METABOLIC PANEL WITH GFR
Anion gap: 7 (ref 5–15)
BUN: 14 mg/dL (ref 8–23)
CO2: 39 mmol/L — ABNORMAL HIGH (ref 22–32)
Calcium: 8.9 mg/dL (ref 8.9–10.3)
Chloride: 94 mmol/L — ABNORMAL LOW (ref 98–111)
Creatinine, Ser: 0.87 mg/dL (ref 0.44–1.00)
GFR, Estimated: >60 mL/min (ref >60)
Glucose, Bld: 107 mg/dL — ABNORMAL HIGH (ref 70–99)
Potassium: 3.4 mmol/L — ABNORMAL LOW (ref 3.5–5.1)
Sodium: 141 mmol/L (ref 135–145)

## 2024-06-18 LAB — GLUCOSE, CAPILLARY
Glucose-Capillary: 108 mg/dL — ABNORMAL HIGH (ref 70–99)
Glucose-Capillary: 90 mg/dL (ref 70–99)
Glucose-Capillary: 109 mg/dL — ABNORMAL HIGH (ref 70–99)
Glucose-Capillary: 106 mg/dL — ABNORMAL HIGH (ref 70–99)

## 2024-06-18 LAB — MAGNESIUM: Magnesium: 2 mg/dL (ref 1.7–2.4)

## 2024-06-18 LAB — HEMOGLOBIN A1C
Hgb A1c MFr Bld: 5.8 % — ABNORMAL HIGH (ref 4.8–5.6)
Mean Plasma Glucose: 119.76 mg/dL

## 2024-06-18 MED ORDER — SPIRONOLACTONE 12.5 MG HALF TABLET
12.5000 mg | ORAL_TABLET | Freq: Every day | ORAL | Status: DC
  Administered 2024-06-18: 17:00:00 12.5 mg via ORAL
  Filled 2024-06-18 (×2): qty 1

## 2024-06-18 NOTE — Progress Notes (Signed)
 Heart Failure Stewardship Pharmacy Note  PCP: Pcp, No PCP-Cardiologist: None  HPI: Stacy Dalton is a 73 y.o. 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 On admission, proBNP was 1638, HS-troponin was 33, UA with rare bacteria + no WBC, and lactic acid was 1.3. Chest x-ray noted mild interstitial edema, mild bilateral lobe opacities.   Pertinent cardiac history: TTE 08/2021 noted LVEF of 60-65% with G1DD. TTE 06/17/24 with LVEF of 60-65% mild LVH, mild to moderate MR.  Pertinent Lab Values: Creat  Date Value Ref Range Status  11/06/2012 0.79 0.50 - 1.10 mg/dL Final   Creatinine, Ser  Date Value Ref Range Status  06/18/2024 0.87 0.44 - 1.00 mg/dL Final   BUN  Date Value Ref Range Status  06/18/2024 14 8 - 23 mg/dL Final  87/69/7977 20 8 - 27 mg/dL Final   Potassium  Date Value Ref Range Status  06/18/2024 3.4 (L) 3.5 - 5.1 mmol/L Final   Sodium  Date Value Ref Range Status  06/18/2024 141 135 - 145 mmol/L Final  07/01/2021 142 134 - 144 mmol/L Final   B Natriuretic Peptide  Date Value Ref Range Status  12/17/2021 63.0 0.0 - 100.0 pg/mL Final    Comment:    Performed at [facility], [address], [place], [place] [address]   Magnesium   Date Value Ref Range Status  06/18/2024 2.0 1.7 - 2.4 mg/dL Final    Comment:    Performed at [facility], [address] [place], [place] [address]   Hgb A1c MFr Bld  Date Value Ref Range Status  08/28/2021 5.2 4.8 - 5.6 % Final    Comment:    (NOTE) Pre diabetes:          5.7%-6.4%  Diabetes:              >6.4%  Glycemic control for   <7.0% adults with diabetes    TSH  Date Value Ref Range Status  05/11/2023 6.964 (H) 0.350 - 4.500 uIU/mL Final    Comment:    Performed by a 3rd Generation assay with a functional  sensitivity of <=0.01 uIU/mL. Performed at [facility], [address], [place], [place] [address]   06/21/2020 4.910 (H) 0.450 - 4.500 uIU/mL Final   LDH  Date Value Ref Range Status  09/19/2018 120 98 - 192 U/L Final    Comment:    Performed at [facility], [address] [place], [place] [address]    Vital Signs: Temp:  [97.6 F (36.4 C)-98.8 F (37.1 C)] 98.8 F (37.1 C) (12/17 0755) Pulse Rate:  [61-76] 63 (12/17 0755) Cardiac Rhythm: Normal sinus rhythm (12/17 0803) Resp:  [10-18] 18 (12/17 0433) BP: (109-143)/(50-75) 143/66 (12/17 0755) SpO2:  [93 %-96 %] 96 % (12/17 0755)  Intake/Output Summary (Last 24 hours) at 06/18/2024 0846 Last data filed at 06/18/2024 0500 Gross per 24 hour  Intake 684.64 ml  Output --  Net 684.64 ml    Current Heart Failure Medications:  Loop diuretic: Beta-Blocker: metoprolol  tartrate 12.5 mg daily ACEI/ARB/ARNI: MRA: SGLT2i: Other:  Prior to admission Heart Failure Medications:  Loop diuretic: none Beta-Blocker: metoprolol  tartrate 12.5 mg BID ACEI/ARB/ARNI: losartan  25 mg daily MRA: none SGLT2i: none Other: amlodipine  10 mg daily  Assessment: 1. Acute on chronic diastolic heart failure (LVEF 60-65%)  , due to  presumed NICM. NYHA class III symptoms.  -Symptoms: Reports still having shortness of breath. Mild LEE.  -Volume: Hypervolemic on exam. Significant contraction alkalosis. Renal function is stable. Continue diuresis with furosemide  40 mg IV BID -Hemodynamics: BP is normal to high. HR 60s. -SGLT2i: Questionable UTI on admission, though UA not overly convincing. Would not start at this time. -MRA: Hypokalemic today. Consider adding spironolactone  12.5 mg daily. -ARNI: Can consider transitioning from losartan  to Entresto  24-26 mg BID with amlodipine  dose reduction pending cost. -No indication for BB noted and may complicate COPD. Can consider dose reduction and discontinuation.  Plan: 1) Medication changes recommended at  this time: -Consider adding spironolactone  12.5 mg daily  2) Patient assistance: -Pending  3) Education: - Patient has been educated on current HF medications and potential additions to HF medication regimen - Patient verbalizes understanding that over the next few months, these medication doses may change and more medications may be added to optimize HF regimen - Patient has been educated on basic disease state pathophysiology and goals of therapy  Please do not hesitate to reach out with questions or concerns,  Jaun Bash, PharmD, CPP, BCPS, [facility] Heart Failure Pharmacist  Phone - [phone] 06/18/2024 10:09 AM

## 2024-06-18 NOTE — Progress Notes (Signed)
 Spoke with son [name] on phone, and updated him on pt status

## 2024-06-18 NOTE — Plan of Care (Signed)
  Problem: Activity: Goal: Ability to tolerate increased activity will improve Outcome: Progressing   Problem: Clinical Measurements: Goal: Ability to maintain a body temperature in the normal range will improve Outcome: Progressing   Problem: Respiratory: Goal: Ability to maintain adequate ventilation will improve Outcome: Progressing Goal: Ability to maintain a clear airway will improve Outcome: Progressing   Problem: Education: Goal: Knowledge of General Education information will improve Description: Including pain rating scale, medication(s)/side effects and non-pharmacologic comfort measures Outcome: Progressing   Problem: Health Behavior/Discharge Planning: Goal: Ability to manage health-related needs will improve Outcome: Progressing   Problem: Clinical Measurements: Goal: Ability to maintain clinical measurements within normal limits will improve Outcome: Progressing Goal: Will remain free from infection Outcome: Progressing Goal: Diagnostic test results will improve Outcome: Progressing Goal: Respiratory complications will improve Outcome: Progressing Goal: Cardiovascular complication will be avoided Outcome: Progressing   Problem: Activity: Goal: Risk for activity intolerance will decrease Outcome: Progressing   Problem: Nutrition: Goal: Adequate nutrition will be maintained Outcome: Progressing   Problem: Coping: Goal: Level of anxiety will decrease Outcome: Progressing   Problem: Elimination: Goal: Will not experience complications related to bowel motility Outcome: Progressing Goal: Will not experience complications related to urinary retention Outcome: Progressing   Problem: Pain Managment: Goal: General experience of comfort will improve and/or be controlled Outcome: Progressing   Problem: Safety: Goal: Ability to remain free from injury will improve Outcome: Progressing   Problem: Skin Integrity: Goal: Risk for impaired skin integrity  will decrease Outcome: Progressing   Problem: Education: Goal: Ability to describe self-care measures that may prevent or decrease complications (Diabetes Survival Skills Education) will improve Outcome: Progressing Goal: Individualized Educational Video(s) Outcome: Progressing   Problem: Coping: Goal: Ability to adjust to condition or change in health will improve Outcome: Progressing   Problem: Fluid Volume: Goal: Ability to maintain a balanced intake and output will improve Outcome: Progressing   Problem: Health Behavior/Discharge Planning: Goal: Ability to identify and utilize available resources and services will improve Outcome: Progressing Goal: Ability to manage health-related needs will improve Outcome: Progressing   Problem: Metabolic: Goal: Ability to maintain appropriate glucose levels will improve Outcome: Progressing   Problem: Nutritional: Goal: Maintenance of adequate nutrition will improve Outcome: Progressing Goal: Progress toward achieving an optimal weight will improve Outcome: Progressing   Problem: Skin Integrity: Goal: Risk for impaired skin integrity will decrease Outcome: Progressing   Problem: Tissue Perfusion: Goal: Adequacy of tissue perfusion will improve Outcome: Progressing

## 2024-06-18 NOTE — Progress Notes (Signed)
 Progress Note   Patient: Stacy Dalton FMW:[id number] DOB: 07-29-1950 DOA: 06/15/2024     3 DOS: the patient was seen and examined on 06/18/2024      73 year old female with history of COPD, depression, HTN, hypothyroidism, migraine and panic attack who came into ED complaining of worsening shortness of breath and cough and wheezing for a week duration. In the ED her blood pressure was 188/80, proBNP was 1638, troponin was 33.  Lactic acid was 1.3 and white count was 13.2.  UA was negative for acute urinary tract infection. Chest x-ray showed interstitial edema. Patient received Lasix , ceftriaxone  and azithromycin  in the emergency room and was admitted for possible new onset of congestive heart failure and possible pneumonia.   12/16: I assumed care of the patient.  Day 3 of antibiotic.  Noted to be 3.0, potassium chloride  40 mEq p.o. one-time dose, potassium chloride  10 mEq IV, 2 doses ordered.  Check BMP in AM.  Recheck magnesium  level in AM.  Complete echo has been ordered and done today.  Pending echo read.  Cardiology is recommending to continue furosemide  twice daily IV.  Will defer fluid management to cardiology.   Assessment & Plan:  Acute on chronic diastolic CHF (congestive heart failure) (HCC) - We will continue diuresis with IV Lasix . - We Will follow serial troponins. - We will follow I's and O's and daily weights. - Cardiology on board and case discussed   Restless leg syndrome Home ropinirole  0.25 mg nightly   Type 2 diabetes mellitus without complications (HCC) Continue sliding scale Monitor glucose level   Essential hypertension - Will continue antihypertensive therapy.  Dyslipidemia Home rosuvastatin  5 mg daily   CAP (community acquired pneumonia) - This is suspected on radiographic studies and the patient has leukocytosis. Continue ceftriaxone  and azithromycin    Anxiety and depression Continue home trazodone  25 mg nightly as needed for sleep 12/16:  Resumed home Lexapro  20 mg daily, buspirone  7.5 mg p.o. twice daily   DVT prophylaxis:  Code Status: Full code Family Communication: None present at bedside  Consultants:  Cardiology   Procedures:  None at this time   Antimicrobials: None at this time   Subjective: Patient seen and examined at bedside this morning She complained of some shortness of breath Currently on 3 L of oxygen by uses the bathroom denies nausea vomiting abdominal pain or chest pain    Examination:   General exam: Appears calm and comfortable  Respiratory system: Clear to auscultation. Respiratory effort normal. Cardiovascular system: S1 & S2 heard, RRR. No JVD, murmurs, rubs, gallops or clicks. No pedal edema. Gastrointestinal system: Abdomen is nondistended, soft and nontender. No organomegaly or masses felt. Normal bowel sounds heard. Central nervous system: Alert and oriented. No focal neurological deficits. Extremities: Symmetric 5 x 5 power. Skin: No rashes, lesions or ulcers Psychiatry: Judgement and insight appear normal. Mood & affect appropriate.    Data Reviewed: I have reviewed the available lab results    Latest Ref Rng & Units 06/17/2024    4:45 AM 06/16/2024    3:57 AM 06/15/2024    4:49 PM  CBC  WBC 4.0 - 10.5 K/uL 8.4  10.8  13.2   Hemoglobin 12.0 - 15.0 g/dL 87.3  88.0  87.3   Hematocrit 36.0 - 46.0 % 41.4  38.7  41.6   Platelets 150 - 400 K/uL 300  257  290        Latest Ref Rng & Units 06/18/2024    4:25 AM  06/17/2024    4:45 AM 06/16/2024    3:57 AM  BMP  Glucose 70 - 99 mg/dL 892  894  863   BUN 8 - 23 mg/dL 14  14  12    Creatinine 0.44 - 1.00 mg/dL 9.12  9.03  9.20   Sodium 135 - 145 mmol/L 141  145  144   Potassium 3.5 - 5.1 mmol/L 3.4  3.0  3.5   Chloride 98 - 111 mmol/L 94  95  98   CO2 22 - 32 mmol/L 39  38  34   Calcium  8.9 - 10.3 mg/dL 8.9  9.0  8.9     Vitals:   06/18/24 0002 06/18/24 0433 06/18/24 0755 06/18/24 1144  BP: (!) 109/56 (!) 124/50 (!) 143/66  (!) 107/54  Pulse: 63 61 63 64  Resp: 17 18    Temp: 98.5 F (36.9 C) 97.6 F (36.4 C) 98.8 F (37.1 C) 97.9 F (36.6 C)  TempSrc:   Oral Oral  SpO2: 95% 94% 96% 97%  Weight:      Height:         Author: Drue ONEIDA Potter, MD 06/18/2024 4:27 PM  For on call review [email or web address].

## 2024-06-18 NOTE — Plan of Care (Signed)
  Problem: Activity: Goal: Ability to tolerate increased activity will improve Outcome: Progressing   Problem: Clinical Measurements: Goal: Ability to maintain a body temperature in the normal range will improve Outcome: Progressing   Problem: Respiratory: Goal: Ability to maintain adequate ventilation will improve Outcome: Progressing   

## 2024-06-18 NOTE — Telephone Encounter (Signed)
 Pharmacy Patient Advocate Encounter  Insurance verification completed.    The patient is insured through [employer].     Ran test claim for Entresto  24-26mg  and the current 30 day co-pay is $16.   This test claim was processed through [facility]- copay amounts may vary at other pharmacies due to [employer], or as the patient moves through the different stages of their insurance plan.

## 2024-06-18 NOTE — Progress Notes (Cosign Needed Addendum)
 [facility] CLINIC CARDIOLOGY PROGRESS NOTE   Patient ID: Stacy Dalton MRN: [id number] DOB/AGE: 75-May-1952 75 y.o.  Admit date: 06/15/2024 Referring Physician Dr. Madison Peaches Primary Physician Pcp, No  Primary Cardiologist None Reason for Consultation AoCHF  HPI: IVALENE Dalton is a 73 y.o. female with a past medical history of COPD, depression, HTN, hypothyroidism, migraines who presented to the ED on 06/15/2024 for SOB, cough, wheezing. Concern for acute heart failure in addition to possible pneumonia. Cardiology was consulted for further evaluation.   Interval History:  -Patient seen and examined this AM, resting in bed.  -No complaints this AM, breathing stable.  -Renal function stable, reports good UOP.  Review of systems complete and found to be negative unless listed above   Vitals:   06/17/24 1539 06/17/24 1937 06/18/24 0002 06/18/24 0433  BP: 130/75 (!) 123/55 (!) 109/56 (!) 124/50  Pulse: 67 76 63 61  Resp: 10 17 17 18   Temp: 98.6 F (37 C) 98.4 F (36.9 C) 98.5 F (36.9 C) 97.6 F (36.4 C)  TempSrc: Oral     SpO2: 94% 94% 95% 94%  Weight:      Height:         Intake/Output Summary (Last 24 hours) at 06/18/2024 0749 Last data filed at 06/18/2024 0500 Gross per 24 hour  Intake 684.64 ml  Output --  Net 684.64 ml     PHYSICAL EXAM General: Chronically ill appearing elderly female, well nourished, in no acute distress. HEENT: Normocephalic and atraumatic. Neck: No JVD.  Lungs: Normal respiratory effort on 2L [place]. Clear bilaterally to auscultation. No wheezes, crackles, rhonchi.  Heart: HRRR. Normal S1 and S2 without gallops or murmurs. Radial & DP pulses 2+ bilaterally. Abdomen: Non-distended appearing.  Msk: Normal strength and tone for age. Extremities: No clubbing, cyanosis or edema.   Neuro: Alert and oriented X 3. Psych: Mood appropriate, affect congruent.    LABS: Basic Metabolic Panel: Recent Labs    06/17/24 0445 06/18/24 0425  NA 145  141  K 3.0* 3.4*  CL 95* 94*  CO2 38* 39*  GLUCOSE 105* 107*  BUN 14 14  CREATININE 0.96 0.87  CALCIUM  9.0 8.9  MG 1.8 2.0   Liver Function Tests: Recent Labs    06/15/24 1649 06/17/24 0445  AST 30 18  ALT 16 16  ALKPHOS 115 101  BILITOT 0.3 0.4  PROT 7.7 7.0  ALBUMIN 4.9 4.2   No results for input(s): LIPASE, AMYLASE in the last 72 hours. CBC: Recent Labs    06/15/24 1649 06/16/24 0357 06/17/24 0445  WBC 13.2* 10.8* 8.4  NEUTROABS 10.0*  --   --   HGB 12.6 11.9* 12.6  HCT 41.6 38.7 41.4  MCV 83.0 82.0 81.7  PLT 290 257 300   Cardiac Enzymes: Recent Labs    06/15/24 2156  CKTOTAL 221   BNP: No results for input(s): BNP in the last 72 hours. D-Dimer: No results for input(s): DDIMER in the last 72 hours. Hemoglobin A1C: No results for input(s): HGBA1C in the last 72 hours. Fasting Lipid Panel: No results for input(s): CHOL, HDL, LDLCALC, TRIG, CHOLHDL, LDLDIRECT in the last 72 hours. Thyroid  Function Tests: No results for input(s): TSH, T4TOTAL, T3FREE, THYROIDAB in the last 72 hours.  Invalid input(s): FREET3 Anemia Panel: No results for input(s): VITAMINB12, FOLATE, FERRITIN, TIBC, IRON, RETICCTPCT in the last 72 hours.  ECHOCARDIOGRAM COMPLETE Result Date: 06/17/2024    ECHOCARDIOGRAM REPORT   Patient Name:   Stacy Dalton  Date of Exam: 06/17/2024 Medical Rec #:  [id number]         Height:       61.0 in Accession #:    [id number]        Weight:       153.4 lb Date of Birth:  1951-05-14         BSA:          1.688 m Patient Age:    73 years          BP:           130/62 mmHg Patient Gender: F                 HR:           61 bpm. Exam Location:  [facility] Procedure: 2D Echo, Cardiac Doppler and Color Doppler (Both Spectral and Color            Flow Doppler were utilized during procedure). Indications:     CHF  History:         Patient has prior history of Echocardiogram examinations, most                  recent  08/29/2021. COPD; Risk Factors:Hypertension, Dyslipidemia                  and Diabetes.  Sonographer:     Philomena Daring Referring Phys:  [id number] XZDYJA PAUDEL Diagnosing Phys: Lonni Hanson MD IMPRESSIONS  1. Left ventricular ejection fraction, by estimation, is 60 to 65%. The left ventricle has normal function. Left ventricular endocardial border not optimally defined to evaluate regional wall motion. There is mild left ventricular hypertrophy. Left ventricular diastolic parameters were normal.  2. Right ventricular systolic function is normal. The right ventricular size is normal. Mildly increased right ventricular wall thickness. There is normal pulmonary artery systolic pressure.  3. The mitral valve is degenerative. Mild to moderate mitral valve regurgitation.  4. The aortic valve has an indeterminant number of cusps. There is mild calcification of the aortic valve. There is mild thickening of the aortic valve. Aortic valve regurgitation is not visualized. Aortic valve sclerosis/calcification is present, without any evidence of aortic stenosis.  5. The inferior vena cava is normal in size with greater than 50% respiratory variability, suggesting right atrial pressure of 3 mmHg. FINDINGS  Left Ventricle: Left ventricular ejection fraction, by estimation, is 60 to 65%. The left ventricle has normal function. Left ventricular endocardial border not optimally defined to evaluate regional wall motion. The left ventricular internal cavity size was normal in size. There is mild left ventricular hypertrophy. Left ventricular diastolic parameters were normal. Right Ventricle: The right ventricular size is normal. Mildly increased right ventricular wall thickness. Right ventricular systolic function is normal. There is normal pulmonary artery systolic pressure. The tricuspid regurgitant velocity is 1.59 m/s, and with an assumed right atrial pressure of 3 mmHg, the estimated right ventricular systolic pressure is 13.1  mmHg. Left Atrium: Left atrial size was normal in size. Right Atrium: Right atrial size was normal in size. Pericardium: There is no evidence of pericardial effusion. Mitral Valve: The mitral valve is degenerative in appearance. There is mild calcification of the mitral valve leaflet(s). Mild to moderate mitral valve regurgitation. Tricuspid Valve: The tricuspid valve is not well visualized. Tricuspid valve regurgitation is mild. Aortic Valve: The aortic valve has an indeterminant number of cusps. There is mild calcification of the aortic valve. There is mild thickening of the  aortic valve. There is mild aortic valve annular calcification. Aortic valve regurgitation is not visualized. Aortic valve sclerosis/calcification is present, without any evidence of aortic stenosis. Aortic valve mean gradient measures 5.9 mmHg. Aortic valve peak gradient measures 11.0 mmHg. Aortic valve area, by VTI measures 1.77 cm. Pulmonic Valve: The pulmonic valve was not well visualized. Pulmonic valve regurgitation is not visualized. No evidence of pulmonic stenosis. Aorta: The aortic root and ascending aorta are structurally normal, with no evidence of dilitation. Pulmonary Artery: The pulmonary artery is not well seen. Venous: The inferior vena cava is normal in size with greater than 50% respiratory variability, suggesting right atrial pressure of 3 mmHg. IAS/Shunts: The interatrial septum was not well visualized.  LEFT VENTRICLE PLAX 2D LVIDd:         4.20 cm   Diastology LVIDs:         2.70 cm   LV e' medial:    9.03 cm/s LV PW:         1.00 cm   LV E/e' medial:  11.7 LV IVS:        1.30 cm   LV e' lateral:   10.40 cm/s LVOT diam:     1.80 cm   LV E/e' lateral: 10.2 LV SV:         64 LV SV Index:   38 LVOT Area:     2.54 cm  RIGHT VENTRICLE             IVC RV Basal diam:  2.80 cm     IVC diam: 1.20 cm RV Mid diam:    2.10 cm RV S prime:     15.00 cm/s TAPSE (M-mode): 2.4 cm LEFT ATRIUM             Index        RIGHT ATRIUM            Index LA diam:        3.60 cm 2.13 cm/m   RA Area:     12.90 cm LA Vol (A2C):   42.2 ml 25.01 ml/m  RA Volume:   25.70 ml  15.23 ml/m LA Vol (A4C):   48.0 ml 28.44 ml/m LA Biplane Vol: 46.7 ml 27.67 ml/m  AORTIC VALVE AV Area (Vmax):    1.79 cm AV Area (Vmean):   1.72 cm AV Area (VTI):     1.77 cm AV Vmax:           166.03 cm/s AV Vmean:          111.910 cm/s AV VTI:            0.362 m AV Peak Grad:      11.0 mmHg AV Mean Grad:      5.9 mmHg LVOT Vmax:         117.00 cm/s LVOT Vmean:        75.500 cm/s LVOT VTI:          0.251 m LVOT/AV VTI ratio: 0.69  AORTA Ao Root diam: 2.50 cm MITRAL VALVE                TRICUSPID VALVE MV Area (PHT): 3.54 cm     TR Peak grad:   10.1 mmHg MV Decel Time: 214 msec     TR Vmax:        159.00 cm/s MV E velocity: 106.00 cm/s MV A velocity: 92.20 cm/s   SHUNTS MV E/A ratio:  1.15  Systemic VTI:  0.25 m                             Systemic Diam: 1.80 cm Lonni Hanson MD Electronically signed by Lonni Hanson MD Signature Date/Time: 06/17/2024/5:40:29 PM    Final      ECHO as above  TELEMETRY (personally reviewed): sinus rhythm rate 70s  EKG (personally reviewed): NSR rate 84 bpm  DATA reviewed by me 06/18/2024: last 24h vitals tele labs imaging I/O, hospitalist progress note  Principal Problem:   Acute on chronic diastolic CHF (congestive heart failure) (HCC) Active Problems:   CAP (community acquired pneumonia)   Essential hypertension   Anxiety and depression   Type 2 diabetes mellitus without complications (HCC)   Dyslipidemia   Restless leg syndrome    ASSESSMENT AND PLAN: ALDINA PORTA is a 73 y.o. female with a past medical history of COPD, depression, HTN, hypothyroidism, migraines who presented to the ED on 06/15/2024 for SOB, cough, wheezing. Concern for acute heart failure in addition to possible pneumonia. Cardiology was consulted for further evaluation.   # Suspected acute HFpEF # Possible pneumonia # COPD #  Hypertension Patient presented with worsening SOB, cough, wheezing. BNP elevated at 1600. Concern for both acute heart failure and pneumonia based on labs, imaging. Started on IV lasix  and IV antibiotics in the ED. Echo this admission with EF 60-65%, mild LVH, normal diastolic function, mild to moderate MR. -Continue IV lasix  40 mg BID.  -Continue losartan  25 mg daily, metoprolol  tartrate 12.5 mg twice daily, amlodipine  10 mg daily.  -Continue crestor  5 mg daily.   This patient's case was discussed and created with Dr. Florencio and he is in agreement.  Signed:  Danita Bloch, PA-C  06/18/2024, 7:49 AM [facility] Cardiology

## 2024-06-19 ENCOUNTER — Other Ambulatory Visit: Payer: Self-pay

## 2024-06-19 DIAGNOSIS — I5033 Acute on chronic diastolic (congestive) heart failure: Secondary | ICD-10-CM | POA: Diagnosis not present

## 2024-06-19 LAB — GLUCOSE, CAPILLARY
Glucose-Capillary: 115 mg/dL — ABNORMAL HIGH (ref 70–99)
Glucose-Capillary: 123 mg/dL — ABNORMAL HIGH (ref 70–99)
Glucose-Capillary: 102 mg/dL — ABNORMAL HIGH (ref 70–99)

## 2024-06-19 LAB — URINALYSIS, COMPLETE (UACMP) WITH MICROSCOPIC
Bacteria, UA: NONE SEEN
Bilirubin Urine: NEGATIVE
Glucose, UA: NEGATIVE mg/dL
Hgb urine dipstick: NEGATIVE
Ketones, ur: NEGATIVE mg/dL
Leukocytes,Ua: NEGATIVE
Nitrite: NEGATIVE
Protein, ur: NEGATIVE mg/dL
RBC / HPF: 0 RBC/hpf (ref 0–5)
Specific Gravity, Urine: 1.017 (ref 1.005–1.030)
pH: 5 (ref 5.0–8.0)

## 2024-06-19 LAB — BASIC METABOLIC PANEL WITH GFR
Anion gap: 10 (ref 5–15)
BUN: 18 mg/dL (ref 8–23)
CO2: 35 mmol/L — ABNORMAL HIGH (ref 22–32)
Calcium: 9.1 mg/dL (ref 8.9–10.3)
Chloride: 95 mmol/L — ABNORMAL LOW (ref 98–111)
Creatinine, Ser: 0.92 mg/dL (ref 0.44–1.00)
GFR, Estimated: >60 mL/min (ref >60)
Glucose, Bld: 101 mg/dL — ABNORMAL HIGH (ref 70–99)
Potassium: 3.4 mmol/L — ABNORMAL LOW (ref 3.5–5.1)
Sodium: 140 mmol/L (ref 135–145)

## 2024-06-19 LAB — CBC
HCT: 40.1 % (ref 36.0–46.0)
Hemoglobin: 12.1 g/dL (ref 12.0–15.0)
MCH: 24.7 pg — ABNORMAL LOW (ref 26.0–34.0)
MCHC: 30.2 g/dL (ref 30.0–36.0)
MCV: 82 fL (ref 80.0–100.0)
Platelets: 251 K/uL (ref 150–400)
RBC: 4.89 MIL/uL (ref 3.87–5.11)
RDW: 15.9 % — ABNORMAL HIGH (ref 11.5–15.5)
WBC: 6.9 K/uL (ref 4.0–10.5)
nRBC: 0 % (ref 0.0–0.2)

## 2024-06-19 MED ORDER — SPIRONOLACTONE 25 MG PO TABS
25.0000 mg | ORAL_TABLET | Freq: Every day | ORAL | Status: DC
  Administered 2024-06-19 – 2024-06-20 (×2): 25 mg via ORAL
  Filled 2024-06-19 (×2): qty 1

## 2024-06-19 MED ORDER — FUROSEMIDE 10 MG/ML IJ SOLN
40.0000 mg | Freq: Two times a day (BID) | INTRAMUSCULAR | Status: AC
  Administered 2024-06-19: 17:00:00 40 mg via INTRAVENOUS
  Filled 2024-06-19: qty 4

## 2024-06-19 MED ORDER — AMLODIPINE BESYLATE 5 MG PO TABS: 5.0000 mg | ORAL_TABLET | Freq: Every day | ORAL | Status: DC

## 2024-06-19 MED ORDER — AZITHROMYCIN 250 MG PO TABS
500.0000 mg | ORAL_TABLET | Freq: Once | ORAL | Status: AC
  Administered 2024-06-19: 22:00:00 500 mg via ORAL
  Filled 2024-06-19: qty 2

## 2024-06-19 MED ORDER — SACUBITRIL-VALSARTAN 24-26 MG PO TABS
1.0000 | ORAL_TABLET | Freq: Two times a day (BID) | ORAL | Status: DC
  Administered 2024-06-19 – 2024-06-20 (×2): 1 via ORAL
  Filled 2024-06-19 (×2): qty 1

## 2024-06-19 MED ORDER — FUROSEMIDE 40 MG PO TABS
40.0000 mg | ORAL_TABLET | Freq: Every day | ORAL | Status: DC
  Administered 2024-06-20: 40 mg via ORAL
  Filled 2024-06-19: qty 1

## 2024-06-19 NOTE — Plan of Care (Signed)
  Problem: Activity: Goal: Ability to tolerate increased activity will improve Outcome: Progressing   Problem: Clinical Measurements: Goal: Ability to maintain a body temperature in the normal range will improve Outcome: Progressing   Problem: Respiratory: Goal: Ability to maintain adequate ventilation will improve Outcome: Progressing Goal: Ability to maintain a clear airway will improve Outcome: Progressing   Problem: Education: Goal: Knowledge of General Education information will improve Description: Including pain rating scale, medication(s)/side effects and non-pharmacologic comfort measures Outcome: Progressing   Problem: Health Behavior/Discharge Planning: Goal: Ability to manage health-related needs will improve Outcome: Progressing   Problem: Clinical Measurements: Goal: Ability to maintain clinical measurements within normal limits will improve Outcome: Progressing Goal: Will remain free from infection Outcome: Progressing Goal: Diagnostic test results will improve Outcome: Progressing Goal: Respiratory complications will improve Outcome: Progressing Goal: Cardiovascular complication will be avoided Outcome: Progressing   Problem: Activity: Goal: Risk for activity intolerance will decrease Outcome: Progressing   Problem: Nutrition: Goal: Adequate nutrition will be maintained Outcome: Progressing   Problem: Coping: Goal: Level of anxiety will decrease Outcome: Progressing   Problem: Elimination: Goal: Will not experience complications related to bowel motility Outcome: Progressing Goal: Will not experience complications related to urinary retention Outcome: Progressing   Problem: Pain Managment: Goal: General experience of comfort will improve and/or be controlled Outcome: Progressing   Problem: Safety: Goal: Ability to remain free from injury will improve Outcome: Progressing   Problem: Skin Integrity: Goal: Risk for impaired skin integrity  will decrease Outcome: Progressing   Problem: Education: Goal: Ability to describe self-care measures that may prevent or decrease complications (Diabetes Survival Skills Education) will improve Outcome: Progressing Goal: Individualized Educational Video(s) Outcome: Progressing   Problem: Coping: Goal: Ability to adjust to condition or change in health will improve Outcome: Progressing   Problem: Fluid Volume: Goal: Ability to maintain a balanced intake and output will improve Outcome: Progressing   Problem: Health Behavior/Discharge Planning: Goal: Ability to identify and utilize available resources and services will improve Outcome: Progressing Goal: Ability to manage health-related needs will improve Outcome: Progressing   Problem: Metabolic: Goal: Ability to maintain appropriate glucose levels will improve Outcome: Progressing   Problem: Nutritional: Goal: Maintenance of adequate nutrition will improve Outcome: Progressing Goal: Progress toward achieving an optimal weight will improve Outcome: Progressing   Problem: Skin Integrity: Goal: Risk for impaired skin integrity will decrease Outcome: Progressing   Problem: Tissue Perfusion: Goal: Adequacy of tissue perfusion will improve Outcome: Progressing

## 2024-06-19 NOTE — TOC Initial Note (Signed)
 Transition of Care [facility] - Initial/Assessment Note    Patient Details  Name: Stacy Dalton MRN: [id number] Date of Birth: 1950-10-12  [facility] CM/SW Contact:    Shasta DELENA Daring, RN Phone Number: 06/19/2024, 1:31 PM  Clinical Narrative:                 RNCM met with patient. Introduced self and explained the CM role.  Patient was alone in room, sitting up in bed. Appeared oriented and alert. Lives alone in apartment. Uses [employer] bus or family for transportation to appointments. If discharged during business hours she will [place] home. Confirmed she can afford to do that. Patient said she has a PCP but could not remember the name. No PCP listed in chart. Will add resources to AVS..Uses [facility] on [address] denies difficulty affording medication. Does not have home health services currently. Has walker in home. Is amenable to [facility] services if recommended by inpatient team.        Patient Goals and CMS Choice            Expected Discharge Plan and Services                                              Prior Living Arrangements/Services                       Activities of Daily Living   ADL Screening (condition at time of admission) Independently performs ADLs?: Yes (appropriate for developmental age) Is the patient deaf or have difficulty hearing?: No Does the patient have difficulty seeing, even when wearing glasses/contacts?: No Does the patient have difficulty concentrating, remembering, or making decisions?: No  Permission Sought/Granted                  Emotional Assessment              Admission diagnosis:  Hypoxia [R09.02] Acute on chronic diastolic CHF (congestive heart failure) (HCC) [I50.33] Pneumonia of both lower lobes due to infectious organism [J18.9] Acute on chronic congestive heart failure, unspecified heart failure type (HCC) [I50.9] Patient Active Problem List   Diagnosis Date Noted   CAP (community acquired  pneumonia) 06/16/2024   Essential hypertension 06/16/2024   Anxiety and depression 06/16/2024   Type 2 diabetes mellitus without complications (HCC) 06/16/2024   Dyslipidemia 06/16/2024   Restless leg syndrome 06/16/2024   Acute on chronic diastolic CHF (congestive heart failure) (HCC) 06/15/2024   Alcoholic intoxication without complication 05/11/2023   GERD (gastroesophageal reflux disease) 10/10/2021   Intractable nausea and vomiting 10/09/2021   Alcoholic ketoacidosis 09/18/2021   Hypertensive urgency 08/27/2021   Esophagitis 08/27/2021   Atypical chest pain 08/27/2021   Seizure-like activity (HCC) 06/06/2021   DTs (delirium tremens) (HCC) 06/06/2021   AKI (acute kidney injury) 05/19/2021   Elevated LFTs 05/19/2021   Lactic acidosis 05/19/2021   COVID-19 virus infection 04/15/2021    Class: Acute   Hypotensive episode 04/15/2021    Class: Acute   Physical deconditioning    Vitamin B12 deficiency 02/04/2021   Leukocytosis 11/23/2020   Alcohol  use disorder, severe, dependence (HCC) 10/08/2020   Hyponatremia 09/17/2020   Alcohol  withdrawal (HCC) 09/12/2020   Moderate protein malnutrition 08/20/2020   Elevated SGOT (AST) 08/19/2020   Elevated troponin 08/19/2020   Transaminitis 08/19/2020   Alcohol  abuse 06/21/2020  Acute respiratory failure with hypoxia (HCC) 09/19/2018   Large bowel obstruction (HCC)    Small bowel obstruction (HCC) 07/20/2018   Hypomagnesemia 07/20/2018   Generalized weakness 03/14/2018   Polypharmacy 02/23/2017   Hypokalemia 02/23/2017   Acute metabolic encephalopathy 02/22/2017   Anxiety 02/22/2017   Altered mental status    Pain medication agreement signed 10/10/2016   Opioid dependence (HCC) 10/10/2016   Status post colostomy takedown 04/12/2016   Chronic constipation 12/03/2015   Peritonitis with abscess of intestine (HCC) 11/29/2015   COPD exacerbation (HCC) 09/20/2015   Osteopenia 06/10/2014   Vitamin D  deficiency 06/10/2014    Hypothyroidism    Depression 11/18/2012   Insomnia 11/18/2012   Chronic pain syndrome 11/18/2012   HLD (hyperlipidemia) 09/01/2012   S/P colostomy (HCC) 04/19/2012   Normocytic anemia 10/15/2011   Hypertension 10/13/2011   Chronic back pain 10/13/2011   COPD (chronic obstructive pulmonary disease) (HCC) 10/09/2011   PCP:  Pcp, No Pharmacy:   [id number] - [place], [place] - [address] [place] [address] Phone: [phone] Fax: [phone]     Social Drivers of Health (SDOH) Social History: SDOH Screenings   Food Insecurity: No Food Insecurity (06/16/2024)  Housing: Low Risk (06/16/2024)  Transportation Needs: No Transportation Needs (06/16/2024)  Utilities: Not At Risk (06/16/2024)  Social Connections: Socially Isolated (06/16/2024)  Tobacco Use: Medium Risk (06/03/2024)   Received from [facility]   SDOH Interventions:     Readmission Risk Interventions    06/19/2024    1:30 PM 04/11/2022   11:48 AM 03/27/2022   10:41 AM  Readmission Risk Prevention Plan  Transportation Screening Complete Complete Complete  PCP or Specialist Appt within 3-5 Days Complete    HRI or Home Care Consult Complete    Social Work Consult for Recovery Care Planning/Counseling Complete    Palliative Care Screening Not Applicable    Medication Review Oceanographer) Complete Complete Complete  PCP or Specialist appointment within 3-5 days of discharge  Complete   HRI or Home Care Consult  Complete Complete  SW Recovery Care/Counseling Consult  Complete Complete  Palliative Care Screening  Not Applicable Not Applicable  Skilled Nursing Facility  Complete Not Applicable

## 2024-06-19 NOTE — Plan of Care (Signed)
°  Problem: Activity: Goal: Ability to tolerate increased activity will improve Outcome: Progressing   Problem: Clinical Measurements: Goal: Ability to maintain a body temperature in the normal range will improve Outcome: Progressing   Problem: Respiratory: Goal: Ability to maintain adequate ventilation will improve Outcome: Progressing Goal: Ability to maintain a clear airway will improve Outcome: Progressing   Problem: Education: Goal: Knowledge of General Education information will improve Description: Including pain rating scale, medication(s)/side effects and non-pharmacologic comfort measures Outcome: Progressing   Problem: Health Behavior/Discharge Planning: Goal: Ability to manage health-related needs will improve Outcome: Progressing   Problem: Clinical Measurements: Goal: Ability to maintain clinical measurements within normal limits will improve Outcome: Progressing Goal: Will remain free from infection Outcome: Progressing Goal: Diagnostic test results will improve Outcome: Progressing Goal: Respiratory complications will improve Outcome: Progressing Goal: Cardiovascular complication will be avoided Outcome: Progressing   Problem: Activity: Goal: Risk for activity intolerance will decrease Outcome: Progressing   Problem: Nutrition: Goal: Adequate nutrition will be maintained Outcome: Progressing   Problem: Elimination: Goal: Will not experience complications related to bowel motility Outcome: Progressing Goal: Will not experience complications related to urinary retention Outcome: Progressing   Problem: Pain Managment: Goal: General experience of comfort will improve and/or be controlled Outcome: Progressing   Problem: Safety: Goal: Ability to remain free from injury will improve Outcome: Progressing   Problem: Education: Goal: Ability to describe self-care measures that may prevent or decrease complications (Diabetes Survival Skills Education)  will improve Outcome: Progressing Goal: Individualized Educational Video(s) Outcome: Progressing   Problem: Skin Integrity: Goal: Risk for impaired skin integrity will decrease Outcome: Progressing   Problem: Fluid Volume: Goal: Ability to maintain a balanced intake and output will improve Outcome: Progressing

## 2024-06-19 NOTE — Progress Notes (Signed)
 Progress Note   Patient: Stacy Dalton FMW:[id number] DOB: Mar 26, 1951 DOA: 06/15/2024     4 DOS: the patient was seen and examined on 06/19/2024   Brief hospital course:     73 year old female with history of COPD, depression, HTN, hypothyroidism, migraine and panic attack who came into ED complaining of worsening shortness of breath and cough and wheezing for a week duration. In the ED her blood pressure was 188/80, proBNP was 1638, troponin was 33.  Lactic acid was 1.3 and white count was 13.2.  UA was negative for acute urinary tract infection. Chest x-ray showed interstitial edema. Patient received Lasix , ceftriaxone  and azithromycin  in the emergency room and was admitted for possible new onset of congestive heart failure and possible pneumonia.   12/16: I assumed care of the patient.  Day 3 of antibiotic.  Noted to be 3.0, potassium chloride  40 mEq p.o. one-time dose, potassium chloride  10 mEq IV, 2 doses ordered.  Check BMP in AM.  Recheck magnesium  level in AM.  Complete echo has been ordered and done today.  Pending echo read.  Cardiology is recommending to continue furosemide  twice daily IV.  Will defer fluid management to cardiology.   Assessment & Plan:  Acute on chronic diastolic CHF (congestive heart failure) (HCC) Continue diuresis as recommended by cardiology with plans to transition to oral Lasix  tomorrow - We will follow I's and O's and daily weights. - Cardiology on board and case discussed   Restless leg syndrome Home ropinirole  0.25 mg nightly   Type 2 diabetes mellitus without complications (HCC) Continue sliding scale Monitor glucose level   Essential hypertension - Will continue antihypertensive therapy.   Dyslipidemia Continue Home rosuvastatin  5 mg daily   CAP (community acquired pneumonia) - This is suspected on radiographic studies and the patient has leukocytosis. Continue ceftriaxone  and azithromycin    Anxiety and depression Continue home  trazodone  25 mg nightly as needed for sleep 12/16: Resumed home Lexapro  20 mg daily, buspirone  7.5 mg p.o. twice daily   DVT prophylaxis:  Code Status: Full code Family Communication: None present at bedside   Consultants:  Cardiology   Procedures:  None at this time   Antimicrobials: None at this time   Subjective: Patient seen and examined at bedside this morning She complained of some shortness of breath Still on 3 L of oxygen but does not use any at home     Examination:   General exam: Appears calm and comfortable  Respiratory system: Clear to auscultation. Respiratory effort normal. Cardiovascular system: S1 & S2 heard, RRR. No JVD, murmurs, rubs, gallops or clicks. No pedal edema. Gastrointestinal system: Abdomen is nondistended, soft and nontender. No organomegaly or masses felt. Normal bowel sounds heard. Central nervous system: Alert and oriented. No focal neurological deficits. Extremities: Symmetric 5 x 5 power. Skin: No rashes, lesions or ulcers Psychiatry: Judgement and insight appear normal. Mood & affect appropriate.    Data Reviewed:    Latest Ref Rng & Units 06/19/2024    4:38 AM 06/17/2024    4:45 AM 06/16/2024    3:57 AM  CBC  WBC 4.0 - 10.5 K/uL 6.9  8.4  10.8   Hemoglobin 12.0 - 15.0 g/dL 87.8  87.3  88.0   Hematocrit 36.0 - 46.0 % 40.1  41.4  38.7   Platelets 150 - 400 K/uL 251  300  257        Latest Ref Rng & Units 06/19/2024    4:38 AM 06/18/2024  4:25 AM 06/17/2024    4:45 AM  BMP  Glucose 70 - 99 mg/dL 898  892  894   BUN 8 - 23 mg/dL 18  14  14    Creatinine 0.44 - 1.00 mg/dL 9.07  9.12  9.03   Sodium 135 - 145 mmol/L 140  141  145   Potassium 3.5 - 5.1 mmol/L 3.4  3.4  3.0   Chloride 98 - 111 mmol/L 95  94  95   CO2 22 - 32 mmol/L 35  39  38   Calcium  8.9 - 10.3 mg/dL 9.1  8.9  9.0     Vitals:   06/19/24 0406 06/19/24 0756 06/19/24 1205 06/19/24 1329  BP: 125/70 (!) 144/71 (!) 101/54   Pulse: 60 68 (!) 59 62  Resp: 18 12  18 10   Temp: 98.4 F (36.9 C) 98.3 F (36.8 C) 97.8 F (36.6 C)   TempSrc: Oral Oral    SpO2: 92% 93% 97% 90%  Weight:      Height:         Author: Drue ONEIDA Potter, MD 06/19/2024 4:15 PM  For on call review [email or web address].

## 2024-06-19 NOTE — Progress Notes (Signed)
 Heart Failure Stewardship Pharmacy Note  PCP: Pcp, No PCP-Cardiologist: None  HPI: Stacy Dalton is a 73 y.o. 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 On admission, proBNP was 1638, HS-troponin was 33, UA with rare bacteria + no WBC, and lactic acid was 1.3. Chest x-ray noted mild interstitial edema, mild bilateral lobe opacities.   Pertinent cardiac history: TTE 08/2021 noted LVEF of 60-65% with G1DD. TTE 06/17/24 with LVEF of 60-65% mild LVH, mild to moderate MR.  Pertinent Lab Values: Creat  Date Value Ref Range Status  11/06/2012 0.79 0.50 - 1.10 mg/dL Final   Creatinine, Ser  Date Value Ref Range Status  06/19/2024 0.92 0.44 - 1.00 mg/dL Final   BUN  Date Value Ref Range Status  06/19/2024 18 8 - 23 mg/dL Final  87/69/7977 20 8 - 27 mg/dL Final   Potassium  Date Value Ref Range Status  06/19/2024 3.4 (L) 3.5 - 5.1 mmol/L Final   Sodium  Date Value Ref Range Status  06/19/2024 140 135 - 145 mmol/L Final  07/01/2021 142 134 - 144 mmol/L Final   B Natriuretic Peptide  Date Value Ref Range Status  12/17/2021 63.0 0.0 - 100.0 pg/mL Final    Comment:    Performed at [facility], [address] [place], [place] [address]   Magnesium   Date Value Ref Range Status  06/18/2024 2.0 1.7 - 2.4 mg/dL Final    Comment:    Performed at [facility], [address] [place], [place] [address]   Hgb A1c MFr Bld  Date Value Ref Range Status  06/18/2024 5.8 (H) 4.8 - 5.6 % Final    Comment:    (NOTE) Diagnosis of Diabetes The following HbA1c ranges recommended by the American Diabetes Association (ADA) may be used as an aid in the diagnosis of diabetes mellitus.  Hemoglobin             Suggested A1C NGSP%              Diagnosis  <5.7                   Non  Diabetic  5.7-6.4                Pre-Diabetic  >6.4                   Diabetic  <7.0                   Glycemic control for                       adults with diabetes.     TSH  Date Value Ref Range Status  05/11/2023 6.964 (H) 0.350 - 4.500 uIU/mL Final    Comment:    Performed by a 3rd Generation assay with a functional sensitivity of <=0.01 uIU/mL. Performed at [facility], [address] [place], [place] [address]   06/21/2020 4.910 (H) 0.450 - 4.500 uIU/mL Final   LDH  Date Value Ref Range Status  09/19/2018 120 98 - 192 U/L Final    Comment:    Performed at [facility], [address] [place], [place] [address]    Vital Signs: Temp:  [97.9 F (36.6 C)-99.3 F (37.4 C)] 98.3 F (36.8 C) (12/18 0756)  Pulse Rate:  [58-68] 68 (12/18 0756) Cardiac Rhythm: Normal sinus rhythm (12/18 0800) Resp:  [12-20] 12 (12/18 0756) BP: (107-144)/(53-71) 144/71 (12/18 0756) SpO2:  [92 %-97 %] 93 % (12/18 0756)  Intake/Output Summary (Last 24 hours) at 06/19/2024 0826 Last data filed at 06/19/2024 0500 Gross per 24 hour  Intake 810 ml  Output 950 ml  Net -140 ml    Current Heart Failure Medications:  Loop diuretic: furosemide  40 mg IV BID Beta-Blocker: metoprolol  tartrate 12.5 mg daily ACEI/ARB/ARNI: losartan  25 mg daily MRA: spironolactone  25 mg daily SGLT2i: none Other: amlodipine  10 mg daily  Prior to admission Heart Failure Medications:  Loop diuretic: none Beta-Blocker: metoprolol  tartrate 12.5 mg BID ACEI/ARB/ARNI: losartan  25 mg daily MRA: none SGLT2i: none Other: amlodipine  10 mg daily  Assessment: 1. Acute on chronic diastolic heart failure (LVEF 60-65%)  , due to presumed NICM. NYHA class III symptoms.  -Symptoms: Reports still having shortness of breath. Mild LEE.  -Volume: Suspect hypervolemia based on patient symptoms and stable renal function, though she does have significant contraction alkalosis and reports dark urine color making volume difficult to  assess. If determine to still be hypervolemic, can consider diamox given high CO2. -Hemodynamics: BP is high. HR 60s. -SGLT2i: Questionable UTI on admission, though UA not overly convincing. Would not start at this time. -MRA: Hypokalemic today. Continue recently added spironolactone  25 mg daily and supp K. -ARNI: Can consider transitioning from losartan  to Entresto  24-26 mg BID with amlodipine  dose reduction. -No indication for BB noted and may complicate COPD. Can consider downtitration and discontinuation.  Plan: 1) Medication changes recommended at this time: -Can consider transition from losartan  to Entresto  24-26 mg BID with reduction of amlodipine  to 5 mg daily  2) Patient assistance: -Entresto  copay is $16  3) Education: - Patient has been educated on current HF medications and potential additions to HF medication regimen - Patient verbalizes understanding that over the next few months, these medication doses may change and more medications may be added to optimize HF regimen - Patient has been educated on basic disease state pathophysiology and goals of therapy  Please do not hesitate to reach out with questions or concerns,  Jaun Bash, PharmD, CPP, BCPS, [facility] Heart Failure Pharmacist  Phone - [phone] 06/19/2024 8:26 AM

## 2024-06-19 NOTE — Progress Notes (Signed)
 [facility] CLINIC CARDIOLOGY PROGRESS NOTE   Patient ID: [name] MRN: [id number] DOB/AGE: 1950-11-07 73 y.o.  Admit date: 06/15/2024 Referring Physician Dr. Madison Peaches Primary Physician Pcp, No  Primary Cardiologist None Reason for Consultation AoCHF  HPI: Stacy Dalton is a 73 y.o. female with a past medical history of COPD, depression, HTN, hypothyroidism, migraines who presented to the ED on 06/15/2024 for SOB, cough, wheezing. Concern for acute heart failure in addition to possible pneumonia. Cardiology was consulted for further evaluation.   Interval History:  -Patient seen and examined this AM, resting in bed.  -No complaints this AM, breathing stable. Remains on RA. Still with some DOE. -Renal function stable with uptrending CO2, reports good UOP.  Review of systems complete and found to be negative unless listed above   Vitals:   06/18/24 2007 06/19/24 0041 06/19/24 0406 06/19/24 0756  BP: 125/63 (!) 123/53 125/70 (!) 144/71  Pulse: 67 (!) 58 60 68  Resp: 20 19 18 12   Temp: 99.3 F (37.4 C) 98.4 F (36.9 C) 98.4 F (36.9 C) 98.3 F (36.8 C)  TempSrc: Oral  Oral Oral  SpO2: 94% 96% 92% 93%  Weight:      Height:         Intake/Output Summary (Last 24 hours) at 06/19/2024 1054 Last data filed at 06/19/2024 1027 Gross per 24 hour  Intake 810 ml  Output 1450 ml  Net -640 ml     PHYSICAL EXAM General: Chronically ill appearing elderly female, well nourished, in no acute distress. HEENT: Normocephalic and atraumatic. Neck: No JVD.  Lungs: Normal respiratory effort on 2L [place]. Clear bilaterally to auscultation. No wheezes, crackles, rhonchi.  Heart: HRRR. Normal S1 and S2 without gallops or murmurs. Radial & DP pulses 2+ bilaterally. Abdomen: Non-distended appearing.  Msk: Normal strength and tone for age. Extremities: No clubbing, cyanosis or edema.   Neuro: Alert and oriented X 3. Psych: Mood appropriate, affect congruent.    LABS: Basic  Metabolic Panel: Recent Labs    06/17/24 0445 06/18/24 0425 06/19/24 0438  NA 145 141 140  K 3.0* 3.4* 3.4*  CL 95* 94* 95*  CO2 38* 39* 35*  GLUCOSE 105* 107* 101*  BUN 14 14 18   CREATININE 0.96 0.87 0.92  CALCIUM  9.0 8.9 9.1  MG 1.8 2.0  --    Liver Function Tests: Recent Labs    06/17/24 0445  AST 18  ALT 16  ALKPHOS 101  BILITOT 0.4  PROT 7.0  ALBUMIN 4.2   No results for input(s): LIPASE, AMYLASE in the last 72 hours. CBC: Recent Labs    06/17/24 0445 06/19/24 0438  WBC 8.4 6.9  HGB 12.6 12.1  HCT 41.4 40.1  MCV 81.7 82.0  PLT 300 251   Cardiac Enzymes: No results for input(s): CKTOTAL, CKMB, CKMBINDEX, TROPONINIHS in the last 72 hours.  BNP: No results for input(s): BNP in the last 72 hours. D-Dimer: No results for input(s): DDIMER in the last 72 hours. Hemoglobin A1C: Recent Labs    06/18/24 0425  HGBA1C 5.8*   Fasting Lipid Panel: No results for input(s): CHOL, HDL, LDLCALC, TRIG, CHOLHDL, LDLDIRECT in the last 72 hours. Thyroid  Function Tests: No results for input(s): TSH, T4TOTAL, T3FREE, THYROIDAB in the last 72 hours.  Invalid input(s): FREET3 Anemia Panel: No results for input(s): VITAMINB12, FOLATE, FERRITIN, TIBC, IRON, RETICCTPCT in the last 72 hours.  ECHOCARDIOGRAM COMPLETE Result Date: 06/17/2024    ECHOCARDIOGRAM REPORT   Patient Name:  Stacy Dalton Date of Exam: 06/17/2024 Medical Rec #:  [id number]         Height:       61.0 in Accession #:    [id number]        Weight:       153.4 lb Date of Birth:  05-01-1951         BSA:          1.688 m Patient Age:    73 years          BP:           130/62 mmHg Patient Gender: F                 HR:           61 bpm. Exam Location:  [facility] Procedure: 2D Echo, Cardiac Doppler and Color Doppler (Both Spectral and Color            Flow Doppler were utilized during procedure). Indications:     CHF  History:         Patient has prior history of  Echocardiogram examinations, most                  recent 08/29/2021. COPD; Risk Factors:Hypertension, Dyslipidemia                  and Diabetes.  Sonographer:     Philomena Daring Referring Phys:  [id number] XZDYJA PAUDEL Diagnosing Phys: Lonni Hanson MD IMPRESSIONS  1. Left ventricular ejection fraction, by estimation, is 60 to 65%. The left ventricle has normal function. Left ventricular endocardial border not optimally defined to evaluate regional wall motion. There is mild left ventricular hypertrophy. Left ventricular diastolic parameters were normal.  2. Right ventricular systolic function is normal. The right ventricular size is normal. Mildly increased right ventricular wall thickness. There is normal pulmonary artery systolic pressure.  3. The mitral valve is degenerative. Mild to moderate mitral valve regurgitation.  4. The aortic valve has an indeterminant number of cusps. There is mild calcification of the aortic valve. There is mild thickening of the aortic valve. Aortic valve regurgitation is not visualized. Aortic valve sclerosis/calcification is present, without any evidence of aortic stenosis.  5. The inferior vena cava is normal in size with greater than 50% respiratory variability, suggesting right atrial pressure of 3 mmHg. FINDINGS  Left Ventricle: Left ventricular ejection fraction, by estimation, is 60 to 65%. The left ventricle has normal function. Left ventricular endocardial border not optimally defined to evaluate regional wall motion. The left ventricular internal cavity size was normal in size. There is mild left ventricular hypertrophy. Left ventricular diastolic parameters were normal. Right Ventricle: The right ventricular size is normal. Mildly increased right ventricular wall thickness. Right ventricular systolic function is normal. There is normal pulmonary artery systolic pressure. The tricuspid regurgitant velocity is 1.59 m/s, and with an assumed right atrial pressure of 3 mmHg,  the estimated right ventricular systolic pressure is 13.1 mmHg. Left Atrium: Left atrial size was normal in size. Right Atrium: Right atrial size was normal in size. Pericardium: There is no evidence of pericardial effusion. Mitral Valve: The mitral valve is degenerative in appearance. There is mild calcification of the mitral valve leaflet(s). Mild to moderate mitral valve regurgitation. Tricuspid Valve: The tricuspid valve is not well visualized. Tricuspid valve regurgitation is mild. Aortic Valve: The aortic valve has an indeterminant number of cusps. There is mild calcification of the aortic valve. There is mild  thickening of the aortic valve. There is mild aortic valve annular calcification. Aortic valve regurgitation is not visualized. Aortic valve sclerosis/calcification is present, without any evidence of aortic stenosis. Aortic valve mean gradient measures 5.9 mmHg. Aortic valve peak gradient measures 11.0 mmHg. Aortic valve area, by VTI measures 1.77 cm. Pulmonic Valve: The pulmonic valve was not well visualized. Pulmonic valve regurgitation is not visualized. No evidence of pulmonic stenosis. Aorta: The aortic root and ascending aorta are structurally normal, with no evidence of dilitation. Pulmonary Artery: The pulmonary artery is not well seen. Venous: The inferior vena cava is normal in size with greater than 50% respiratory variability, suggesting right atrial pressure of 3 mmHg. IAS/Shunts: The interatrial septum was not well visualized.  LEFT VENTRICLE PLAX 2D LVIDd:         4.20 cm   Diastology LVIDs:         2.70 cm   LV e' medial:    9.03 cm/s LV PW:         1.00 cm   LV E/e' medial:  11.7 LV IVS:        1.30 cm   LV e' lateral:   10.40 cm/s LVOT diam:     1.80 cm   LV E/e' lateral: 10.2 LV SV:         64 LV SV Index:   38 LVOT Area:     2.54 cm  RIGHT VENTRICLE             IVC RV Basal diam:  2.80 cm     IVC diam: 1.20 cm RV Mid diam:    2.10 cm RV S prime:     15.00 cm/s TAPSE (M-mode): 2.4  cm LEFT ATRIUM             Index        RIGHT ATRIUM           Index LA diam:        3.60 cm 2.13 cm/m   RA Area:     12.90 cm LA Vol (A2C):   42.2 ml 25.01 ml/m  RA Volume:   25.70 ml  15.23 ml/m LA Vol (A4C):   48.0 ml 28.44 ml/m LA Biplane Vol: 46.7 ml 27.67 ml/m  AORTIC VALVE AV Area (Vmax):    1.79 cm AV Area (Vmean):   1.72 cm AV Area (VTI):     1.77 cm AV Vmax:           166.03 cm/s AV Vmean:          111.910 cm/s AV VTI:            0.362 m AV Peak Grad:      11.0 mmHg AV Mean Grad:      5.9 mmHg LVOT Vmax:         117.00 cm/s LVOT Vmean:        75.500 cm/s LVOT VTI:          0.251 m LVOT/AV VTI ratio: 0.69  AORTA Ao Root diam: 2.50 cm MITRAL VALVE                TRICUSPID VALVE MV Area (PHT): 3.54 cm     TR Peak grad:   10.1 mmHg MV Decel Time: 214 msec     TR Vmax:        159.00 cm/s MV E velocity: 106.00 cm/s MV A velocity: 92.20 cm/s   SHUNTS MV E/A ratio:  1.15  Systemic VTI:  0.25 m                             Systemic Diam: 1.80 cm Lonni Hanson MD Electronically signed by Lonni Hanson MD Signature Date/Time: 06/17/2024/5:40:29 PM    Final      ECHO as above  TELEMETRY (personally reviewed): sinus rhythm rate 60s  EKG (personally reviewed): NSR rate 84 bpm  DATA reviewed by me 06/19/2024: last 24h vitals tele labs imaging I/O, hospitalist progress note  Principal Problem:   Acute on chronic diastolic CHF (congestive heart failure) (HCC) Active Problems:   CAP (community acquired pneumonia)   Essential hypertension   Anxiety and depression   Type 2 diabetes mellitus without complications (HCC)   Dyslipidemia   Restless leg syndrome    ASSESSMENT AND PLAN: MERSADIE KAVANAUGH is a 73 y.o. female with a past medical history of COPD, depression, HTN, hypothyroidism, migraines who presented to the ED on 06/15/2024 for SOB, cough, wheezing. Concern for acute heart failure in addition to possible pneumonia. Cardiology was consulted for further evaluation.   #  Suspected acute HFpEF # Possible pneumonia # COPD # Hypertension Patient presented with worsening SOB, cough, wheezing. BNP elevated at 1600. Concern for both acute heart failure and pneumonia based on labs, imaging. Started on IV lasix  and IV antibiotics in the ED. Echo this admission with EF 60-65%, mild LVH, normal diastolic function, mild to moderate MR. -Will stop lasix . Start po lasix  20 mg daily tomorrow.  -Continue metoprolol  tartrate 12.5 mg twice daily, amlodipine  10 mg daily. Will switch losartan  to entresto  24-26 mg twice daily tomorrow and decrease amlodipine  to 5 mg. -Continue crestor  5 mg daily.   This patient's case was discussed and created with Dr. Ammon and he is in agreement.  Signed:  Danita Bloch, PA-C  06/19/2024, 10:54 AM [facility] Cardiology

## 2024-06-20 DIAGNOSIS — I5033 Acute on chronic diastolic (congestive) heart failure: Secondary | ICD-10-CM | POA: Diagnosis not present

## 2024-06-20 LAB — BASIC METABOLIC PANEL WITH GFR
Anion gap: 10 (ref 5–15)
BUN: 22 mg/dL (ref 8–23)
CO2: 36 mmol/L — ABNORMAL HIGH (ref 22–32)
Calcium: 9.1 mg/dL (ref 8.9–10.3)
Chloride: 94 mmol/L — ABNORMAL LOW (ref 98–111)
Creatinine, Ser: 0.87 mg/dL (ref 0.44–1.00)
GFR, Estimated: >60 mL/min
Glucose, Bld: 118 mg/dL — ABNORMAL HIGH (ref 70–99)
Potassium: 3.3 mmol/L — ABNORMAL LOW (ref 3.5–5.1)
Sodium: 140 mmol/L (ref 135–145)

## 2024-06-20 LAB — GLUCOSE, CAPILLARY
Glucose-Capillary: 114 mg/dL — ABNORMAL HIGH (ref 70–99)
Glucose-Capillary: 140 mg/dL — ABNORMAL HIGH (ref 70–99)

## 2024-06-20 MED ORDER — POTASSIUM CHLORIDE 20 MEQ PO PACK
40.0000 meq | PACK | Freq: Once | ORAL | Status: AC
  Administered 2024-06-20: 40 meq via ORAL
  Filled 2024-06-20: qty 2

## 2024-06-20 MED ORDER — SPIRONOLACTONE 25 MG PO TABS
25.0000 mg | ORAL_TABLET | Freq: Every day | ORAL | 1 refills | Status: DC
  Filled 2024-06-20: qty 30, 30d supply, fill #0

## 2024-06-20 MED ORDER — SPIRONOLACTONE 25 MG PO TABS: 25.0000 mg | ORAL_TABLET | Freq: Every day | ORAL | 1 refills | Status: AC

## 2024-06-20 MED ORDER — SACUBITRIL-VALSARTAN 24-26 MG PO TABS
1.0000 | ORAL_TABLET | Freq: Two times a day (BID) | ORAL | 2 refills | Status: DC
  Filled 2024-06-20: qty 60, 30d supply, fill #0

## 2024-06-20 MED ORDER — FUROSEMIDE 40 MG PO TABS
40.0000 mg | ORAL_TABLET | Freq: Every day | ORAL | 2 refills | Status: DC
  Filled 2024-06-20: qty 30, 30d supply, fill #0

## 2024-06-20 MED ORDER — FUROSEMIDE 40 MG PO TABS: 40.0000 mg | ORAL_TABLET | Freq: Every day | ORAL | 2 refills | Status: AC

## 2024-06-20 MED ORDER — SACUBITRIL-VALSARTAN 24-26 MG PO TABS: 1.0000 | ORAL_TABLET | Freq: Two times a day (BID) | ORAL | 2 refills | Status: AC

## 2024-06-20 NOTE — Progress Notes (Signed)
 SATURATION QUALIFICATIONS: (This note is used to comply with regulatory documentation for home oxygen)  Patient Saturations on Room Air at Rest = 92%  Patient Saturations on Room Air while Ambulating = 91%  Patient did not require oxygen while ambulating

## 2024-06-20 NOTE — Progress Notes (Signed)
 [facility] CLINIC CARDIOLOGY PROGRESS NOTE   Patient ID: Stacy Dalton MRN: [id number] DOB/AGE: [birth year] 73 y.o.  Admit date: 06/15/2024 Referring Physician Dr. Madison Peaches Primary Physician Pcp, No  Primary Cardiologist None Reason for Consultation AoCHF  HPI: Stacy Dalton is a 73 y.o. female with a past medical history of COPD, depression, HTN, hypothyroidism, migraines who presented to the ED on 06/15/2024 for SOB, cough, wheezing. Concern for acute heart failure in addition to possible pneumonia. Cardiology was consulted for further evaluation.   Interval History:  -Patient seen and examined this AM, resting in bed.  -No complaints this AM, states her breathing is stable.  - Renal function stable, transitioning to p.o. Lasix  today.  Review of systems complete and found to be negative unless listed above   Vitals:   06/19/24 1651 06/19/24 1929 06/20/24 0106 06/20/24 0332  BP: 121/63 (!) 96/52 (!) 108/52 117/64  Pulse: 63 69 62 (!) 58  Resp: 17 20 17 18   Temp: 98.5 F (36.9 C) 99.1 F (37.3 C) 98.1 F (36.7 C) 97.9 F (36.6 C)  TempSrc:  Oral  Axillary  SpO2: 94% 96% 90% 90%  Weight:      Height:         Intake/Output Summary (Last 24 hours) at 06/20/2024 0847 Last data filed at 06/19/2024 1333 Gross per 24 hour  Intake 120 ml  Output 500 ml  Net -380 ml     PHYSICAL EXAM General: Chronically ill appearing elderly female, well nourished, in no acute distress. HEENT: Normocephalic and atraumatic. Neck: No JVD.  Lungs: Normal respiratory effort on 2L [place]. Clear bilaterally to auscultation. No wheezes, crackles, rhonchi.  Heart: HRRR. Normal S1 and S2 without gallops or murmurs. Radial & DP pulses 2+ bilaterally. Abdomen: Non-distended appearing.  Msk: Normal strength and tone for age. Extremities: No clubbing, cyanosis or edema.   Neuro: Alert and oriented X 3. Psych: Mood appropriate, affect congruent.    LABS: Basic Metabolic Panel: Recent Labs     06/18/24 0425 06/19/24 0438 06/20/24 0427  NA 141 140 140  K 3.4* 3.4* 3.3*  CL 94* 95* 94*  CO2 39* 35* 36*  GLUCOSE 107* 101* 118*  BUN 14 18 22   CREATININE 0.87 0.92 0.87  CALCIUM  8.9 9.1 9.1  MG 2.0  --   --    Liver Function Tests: No results for input(s): AST, ALT, ALKPHOS, BILITOT, PROT, ALBUMIN in the last 72 hours.  No results for input(s): LIPASE, AMYLASE in the last 72 hours. CBC: Recent Labs    06/19/24 0438  WBC 6.9  HGB 12.1  HCT 40.1  MCV 82.0  PLT 251   Cardiac Enzymes: No results for input(s): CKTOTAL, CKMB, CKMBINDEX, TROPONINIHS in the last 72 hours.  BNP: No results for input(s): BNP in the last 72 hours. D-Dimer: No results for input(s): DDIMER in the last 72 hours. Hemoglobin A1C: Recent Labs    06/18/24 0425  HGBA1C 5.8*   Fasting Lipid Panel: No results for input(s): CHOL, HDL, LDLCALC, TRIG, CHOLHDL, LDLDIRECT in the last 72 hours. Thyroid  Function Tests: No results for input(s): TSH, T4TOTAL, T3FREE, THYROIDAB in the last 72 hours.  Invalid input(s): FREET3 Anemia Panel: No results for input(s): VITAMINB12, FOLATE, FERRITIN, TIBC, IRON, RETICCTPCT in the last 72 hours.  No results found.    ECHO as above  TELEMETRY (personally reviewed): sinus rhythm rate 80s  EKG (personally reviewed): NSR rate 84 bpm  DATA reviewed by me 06/20/2024: last 24h vitals tele  labs imaging I/O, hospitalist progress note  Principal Problem:   Acute on chronic diastolic CHF (congestive heart failure) (HCC) Active Problems:   CAP (community acquired pneumonia)   Essential hypertension   Anxiety and depression   Type 2 diabetes mellitus without complications (HCC)   Dyslipidemia   Restless leg syndrome    ASSESSMENT AND PLAN: Stacy Dalton is a 73 y.o. female with a past medical history of COPD, depression, HTN, hypothyroidism, migraines who presented to the ED on 06/15/2024 for  SOB, cough, wheezing. Concern for acute heart failure in addition to possible pneumonia. Cardiology was consulted for further evaluation.   # Acute HFpEF # Possible pneumonia # COPD # Hypertension Patient presented with worsening SOB, cough, wheezing. BNP elevated at 1600. Concern for both acute heart failure and pneumonia based on labs, imaging. Started on IV lasix  and IV antibiotics in the ED. Echo this admission with EF 60-65%, mild LVH, normal diastolic function (previously reported as grade 1 diastolic dysfunction), mild to moderate MR. -Start po lasix  20 mg daily today.  -Continue metoprolol  tartrate 12.5 mg twice daily and spironolactone  25 mg daily.  Transitioning to Entresto  24-26 mg daily today, stopping amlodipine  as BP was soft overnight. -Continue crestor  5 mg daily.  Ok for discharge today from a cardiac perspective. Will arrange for follow up in clinic with Dr. Wilburn in 1-2 weeks.    This patient's case was discussed and created with Dr. Florencio and he is in agreement.  Signed:  Danita Bloch, PA-C  06/20/2024, 8:47 AM [facility] Cardiology

## 2024-06-20 NOTE — Discharge Summary (Signed)
 " Physician Discharge Summary   Patient: Stacy Dalton MRN: [id number] DOB: 10-Apr-1951  Admit date:     06/15/2024  Discharge date: 06/20/2024  Discharge Physician: Drue ONEIDA Potter   PCP: Pcp, No   Recommendations at discharge:  Follow-up with cardiology  Discharge Diagnoses: Principal Problem:   Acute on chronic diastolic CHF (congestive heart failure) (HCC) Active Problems:   Essential hypertension   Type 2 diabetes mellitus without complications (HCC)   Restless leg syndrome   Dyslipidemia   CAP (community acquired pneumonia)   Anxiety and depression  Resolved Problems:   * No resolved hospital problems. *  Hospital Course:  From HPI 73 year old female with history of COPD, depression, HTN, hypothyroidism, migraine and panic attack who came into ED complaining of worsening shortness of breath and cough and wheezing for a week duration. In the ED her blood pressure was 188/80, proBNP was 1638, troponin was 33.  Lactic acid was 1.3 and white count was 13.2.  UA was negative for acute urinary tract infection. Chest x-ray showed interstitial edema. Patient received Lasix , ceftriaxone  and azithromycin  in the emergency room and was admitted for possible new onset of congestive heart failure and possible pneumonia.   12/16: I assumed care of the patient.  Day 3 of antibiotic.  Noted to be 3.0, potassium chloride  40 mEq p.o. one-time dose, potassium chloride  10 mEq IV, 2 doses ordered.  Check BMP in AM.  Recheck magnesium  level in AM.  Complete echo has been ordered and done today.  Pending echo read.  Cardiology is recommending to continue furosemide  twice daily IV.  Will defer fluid management to cardiology.    Other hospital course as noted below:   Assessment & Plan:  Acute on chronic diastolic CHF (congestive heart failure) (HCC) Continue oral Lasix  Patient cleared for discharge today by cardiologist   Restless leg syndrome Home ropinirole  0.25 mg nightly   Type 2  diabetes mellitus without complications (HCC) Continue sliding scale Monitor glucose level   Essential hypertension - Will continue antihypertensive therapy.   Dyslipidemia Continue Home rosuvastatin  5 mg daily   CAP (community acquired pneumonia) - This is suspected on radiographic studies and the patient has leukocytosis. Continue ceftriaxone  and azithromycin    Anxiety and depression Continue home trazodone  25 mg nightly as needed for sleep 12/16: Resumed home Lexapro  20 mg daily, buspirone  7.5 mg p.o. twice daily    Consultants: Cardiology Procedures performed: None Disposition: Home Diet recommendation:  Cardiac diet DISCHARGE MEDICATION: Allergies as of 06/20/2024       Reactions   Librium  [chlordiazepoxide ]    Became unresponsive   Toradol  [ketorolac  Tromethamine ] Shortness Of Breath   Butalbital-apap-caffeine  Other (See Comments)   jittery   Demerol  Swelling   Esgic [butalbital-apap-caffeine ] Other (See Comments)   jittery   Iodine  Other (See Comments)   bp bottomed out per pt several hours later; unsure if pre medicated in past with cm; [facility]   Limonene    Other reaction(s): Other (See Comments) jittery   Meperidine  Swelling   Pyridium [phenazopyridine] Nausea Only        Medication List     STOP taking these medications    amLODipine  10 MG tablet Commonly known as: NORVASC    lidocaine  5 % Commonly known as: LIDODERM    losartan  25 MG tablet Commonly known as: COZAAR    temazepam  15 MG capsule Commonly known as: RESTORIL        TAKE these medications    rOPINIRole  0.25 MG tablet  Commonly known as: REQUIP  Take 0.25 mg by mouth at bedtime. The timing of this medication is very important.   acetaminophen  325 MG tablet Commonly known as: TYLENOL  Take 2 tablets (650 mg total) by mouth every 6 (six) hours as needed for mild pain (pain score 1-3), fever or headache (or Fever >/= 101).   albuterol  108 (90 Base) MCG/ACT  inhaler Commonly known as: VENTOLIN  HFA Inhale 2 puffs into the lungs every 4 (four) hours as needed for wheezing or shortness of breath.   ascorbic acid  500 MG tablet Commonly known as: VITAMIN C  Take 500 mg by mouth See admin instructions.   BC Fast Pain Relief 845-65 MG Pack Generic drug: Aspirin -Caffeine  Take 1 packet by mouth every 8 (eight) hours as needed.   busPIRone  7.5 MG tablet Commonly known as: BUSPAR  Take 7.5 mg by mouth 2 (two) times daily.   escitalopram  20 MG tablet Commonly known as: LEXAPRO  Take 20 mg by mouth daily.   ferrous sulfate  325 (65 FE) MG tablet Take 325 mg by mouth daily with breakfast.   folic acid  1 MG tablet Commonly known as: FOLVITE  Take 1 tablet (1 mg total) by mouth daily.   furosemide  40 MG tablet Commonly known as: LASIX  Take 1 tablet (40 mg total) by mouth daily.   ipratropium-albuterol  0.5-2.5 (3) MG/3ML Soln Commonly known as: DUONEB Take 3 mLs by nebulization 3 (three) times daily.   levothyroxine  50 MCG tablet Commonly known as: Synthroid  Take 1 tablet (50 mcg total) by mouth daily before breakfast.   metFORMIN 500 MG 24 hr tablet Commonly known as: GLUCOPHAGE-XR Take 500 mg by mouth daily with supper.   metoprolol  tartrate 25 MG tablet Commonly known as: LOPRESSOR  Take 0.5 tablets (12.5 mg total) by mouth 2 (two) times daily.   multivitamin with minerals Tabs tablet Take 1 tablet by mouth daily.   ondansetron  4 MG tablet Commonly known as: ZOFRAN  Take 4 mg by mouth every 8 (eight) hours as needed.   oxyCODONE  5 MG immediate release tablet Commonly known as: Oxy IR/ROXICODONE  Take 1 tablet (5 mg total) by mouth every 6 (six) hours as needed for severe pain (pain score 7-10).   rosuvastatin  5 MG tablet Commonly known as: CRESTOR  Take 5 mg by mouth daily.   sacubitril -valsartan  24-26 MG Commonly known as: ENTRESTO  Take 1 tablet by mouth 2 (two) times daily.   spironolactone  25 MG tablet Commonly known as:  ALDACTONE  Take 1 tablet (25 mg total) by mouth daily.   thiamine  100 MG tablet Commonly known as: Vitamin B-1 Take 1 tablet (100 mg total) by mouth daily.   traMADol  50 MG tablet Commonly known as: ULTRAM  Take 50 mg by mouth 2 (two) times daily.   traZODone  100 MG tablet Commonly known as: DESYREL  Take 100 mg by mouth at bedtime.   Vitamin D  (Ergocalciferol ) 1.25 MG (50000 UNIT) Caps capsule Commonly known as: DRISDOL  Take 50,000 Units by mouth once a week.   Wixela Inhub 250-50 MCG/ACT Aepb Generic drug: fluticasone -salmeterol Inhale 1 puff into the lungs 2 (two) times daily as needed.        Follow-up Information     Alluri, Keller BROCKS, MD. Go in 1 week(s).   Specialty: Cardiology Why: Hospital Follow-Up: Dec. 26th @ 9:45 AM Contact information: [address] [place] [address] [phone]                Discharge Exam: Fredricka Weights   06/15/24 1639  Weight: 69.6 kg  General exam: Appears calm and comfortable  Respiratory system: Clear to auscultation. Respiratory effort normal. Cardiovascular system: S1 & S2 heard, RRR. No JVD, murmurs, rubs, gallops or clicks. No pedal edema. Gastrointestinal system: Abdomen is nondistended, soft and nontender. No organomegaly or masses felt. Normal bowel sounds heard. Central nervous system: Alert and oriented. No focal neurological deficits. Extremities: Symmetric 5 x 5 power. Skin: No rashes, lesions or ulcers Psychiatry: Judgement and insight appear normal. Mood & affect appropriate.   Condition at discharge: good  The results of significant diagnostics from this hospitalization (including imaging, microbiology, ancillary and laboratory) are listed below for reference.   Imaging Studies: ECHOCARDIOGRAM COMPLETE Result Date: 06/17/2024    ECHOCARDIOGRAM REPORT   Patient Name:   Stacy Dalton Date of Exam: 06/17/2024 Medical Rec #:  [id number]         Height:       61.0 in Accession #:     [id number]        Weight:       153.4 lb Date of Birth:  06/18/1951         BSA:          1.688 m Patient Age:    73 years          BP:           130/62 mmHg Patient Gender: F                 HR:           61 bpm. Exam Location:  [facility] Procedure: 2D Echo, Cardiac Doppler and Color Doppler (Both Spectral and Color            Flow Doppler were utilized during procedure). Indications:     CHF  History:         Patient has prior history of Echocardiogram examinations, most                  recent 08/29/2021. COPD; Risk Factors:Hypertension, Dyslipidemia                  and Diabetes.  Sonographer:     Philomena Daring Referring Phys:  [id number] XZDYJA PAUDEL Diagnosing Phys: Lonni Hanson MD IMPRESSIONS  1. Left ventricular ejection fraction, by estimation, is 60 to 65%. The left ventricle has normal function. Left ventricular endocardial border not optimally defined to evaluate regional wall motion. There is mild left ventricular hypertrophy. Left ventricular diastolic parameters were normal.  2. Right ventricular systolic function is normal. The right ventricular size is normal. Mildly increased right ventricular wall thickness. There is normal pulmonary artery systolic pressure.  3. The mitral valve is degenerative. Mild to moderate mitral valve regurgitation.  4. The aortic valve has an indeterminant number of cusps. There is mild calcification of the aortic valve. There is mild thickening of the aortic valve. Aortic valve regurgitation is not visualized. Aortic valve sclerosis/calcification is present, without any evidence of aortic stenosis.  5. The inferior vena cava is normal in size with greater than 50% respiratory variability, suggesting right atrial pressure of 3 mmHg. FINDINGS  Left Ventricle: Left ventricular ejection fraction, by estimation, is 60 to 65%. The left ventricle has normal function. Left ventricular endocardial border not optimally defined to evaluate regional wall motion. The left ventricular  internal cavity size was normal in size. There is mild left ventricular hypertrophy. Left ventricular diastolic parameters were normal. Right Ventricle: The right ventricular size is normal. Mildly increased right ventricular  wall thickness. Right ventricular systolic function is normal. There is normal pulmonary artery systolic pressure. The tricuspid regurgitant velocity is 1.59 m/s, and with an assumed right atrial pressure of 3 mmHg, the estimated right ventricular systolic pressure is 13.1 mmHg. Left Atrium: Left atrial size was normal in size. Right Atrium: Right atrial size was normal in size. Pericardium: There is no evidence of pericardial effusion. Mitral Valve: The mitral valve is degenerative in appearance. There is mild calcification of the mitral valve leaflet(s). Mild to moderate mitral valve regurgitation. Tricuspid Valve: The tricuspid valve is not well visualized. Tricuspid valve regurgitation is mild. Aortic Valve: The aortic valve has an indeterminant number of cusps. There is mild calcification of the aortic valve. There is mild thickening of the aortic valve. There is mild aortic valve annular calcification. Aortic valve regurgitation is not visualized. Aortic valve sclerosis/calcification is present, without any evidence of aortic stenosis. Aortic valve mean gradient measures 5.9 mmHg. Aortic valve peak gradient measures 11.0 mmHg. Aortic valve area, by VTI measures 1.77 cm. Pulmonic Valve: The pulmonic valve was not well visualized. Pulmonic valve regurgitation is not visualized. No evidence of pulmonic stenosis. Aorta: The aortic root and ascending aorta are structurally normal, with no evidence of dilitation. Pulmonary Artery: The pulmonary artery is not well seen. Venous: The inferior vena cava is normal in size with greater than 50% respiratory variability, suggesting right atrial pressure of 3 mmHg. IAS/Shunts: The interatrial septum was not well visualized.  LEFT VENTRICLE PLAX 2D  LVIDd:         4.20 cm   Diastology LVIDs:         2.70 cm   LV e' medial:    9.03 cm/s LV PW:         1.00 cm   LV E/e' medial:  11.7 LV IVS:        1.30 cm   LV e' lateral:   10.40 cm/s LVOT diam:     1.80 cm   LV E/e' lateral: 10.2 LV SV:         64 LV SV Index:   38 LVOT Area:     2.54 cm  RIGHT VENTRICLE             IVC RV Basal diam:  2.80 cm     IVC diam: 1.20 cm RV Mid diam:    2.10 cm RV S prime:     15.00 cm/s TAPSE (M-mode): 2.4 cm LEFT ATRIUM             Index        RIGHT ATRIUM           Index LA diam:        3.60 cm 2.13 cm/m   RA Area:     12.90 cm LA Vol (A2C):   42.2 ml 25.01 ml/m  RA Volume:   25.70 ml  15.23 ml/m LA Vol (A4C):   48.0 ml 28.44 ml/m LA Biplane Vol: 46.7 ml 27.67 ml/m  AORTIC VALVE AV Area (Vmax):    1.79 cm AV Area (Vmean):   1.72 cm AV Area (VTI):     1.77 cm AV Vmax:           166.03 cm/s AV Vmean:          111.910 cm/s AV VTI:            0.362 m AV Peak Grad:      11.0 mmHg AV Mean Grad:  5.9 mmHg LVOT Vmax:         117.00 cm/s LVOT Vmean:        75.500 cm/s LVOT VTI:          0.251 m LVOT/AV VTI ratio: 0.69  AORTA Ao Root diam: 2.50 cm MITRAL VALVE                TRICUSPID VALVE MV Area (PHT): 3.54 cm     TR Peak grad:   10.1 mmHg MV Decel Time: 214 msec     TR Vmax:        159.00 cm/s MV E velocity: 106.00 cm/s MV A velocity: 92.20 cm/s   SHUNTS MV E/A ratio:  1.15         Systemic VTI:  0.25 m                             Systemic Diam: 1.80 cm Lonni Hanson MD Electronically signed by Lonni Hanson MD Signature Date/Time: 06/17/2024/5:40:29 PM    Final    CT Cervical Spine Wo Contrast Result Date: 06/15/2024 EXAM: CT CERVICAL SPINE WITHOUT CONTRAST 06/15/2024 05:19:08 PM TECHNIQUE: CT of the cervical spine was performed without the administration of intravenous contrast. Multiplanar reformatted images are provided for review. Automated exposure control, iterative reconstruction, and/or weight based adjustment of the mA/kV was utilized to reduce the  radiation dose to as low as reasonably achievable. COMPARISON: None available. CLINICAL HISTORY: Neck trauma (Age >= 65y) FINDINGS: BONES AND ALIGNMENT: No acute fracture or traumatic malalignment. DEGENERATIVE CHANGES: Mild degenerative changes of the mid / lower cervical spine. SOFT TISSUES: No prevertebral soft tissue swelling. LIMITATIONS/ARTIFACTS: Motion degraded images. IMPRESSION: 1. No evidence of acute traumatic injury. Electronically signed by: Pinkie Pebbles MD 06/15/2024 05:49 PM EST RP Workstation: [id number]   CT Head Wo Contrast Result Date: 06/15/2024 EXAM: CT HEAD WITHOUT 06/15/2024 05:19:08 PM TECHNIQUE: CT of the head was performed without the administration of intravenous contrast. Automated exposure control, iterative reconstruction, and/or weight based adjustment of the mA/kV was utilized to reduce the radiation dose to as low as reasonably achievable. COMPARISON: 03/24/2022 CLINICAL HISTORY: Head trauma, minor (Age >= 65y) FINDINGS: BRAIN AND VENTRICLES: No acute intracranial hemorrhage. No mass effect or midline shift. No extra-axial fluid collection. No evidence of acute infarct. No hydrocephalus. ORBITS: No acute abnormality. SINUSES AND MASTOIDS: No acute abnormality. SOFT TISSUES AND SKULL: No acute skull fracture. No acute soft tissue abnormality. IMPRESSION: 1. No acute intracranial abnormality. Electronically signed by: Pinkie Pebbles MD 06/15/2024 05:48 PM EST RP Workstation: [id number]   DG Chest 2 View Result Date: 06/15/2024 EXAM: 2 VIEW(S) XRAY OF THE CHEST 06/15/2024 05:00:00 PM COMPARISON: 09/10/2023 CLINICAL HISTORY: Hypoxia FINDINGS: LUNGS AND PLEURA: Possible very mild interstitial edema, although equivocal. Mild bilateral lower lobe opacities, likely atelectasis. No pleural effusion. No pneumothorax. HEART AND MEDIASTINUM: Thoracic aortic atherosclerosis. Mild cardiomegaly. BONES AND SOFT TISSUES: No acute osseous abnormality. IMPRESSION: 1. Possible mild  interstitial edema, although equivocal. 2. Mild cardiomegaly. 3. Mild bilateral lower lobe opacities, likely atelectasis. Electronically signed by: Pinkie Pebbles MD 06/15/2024 05:45 PM EST RP Workstation: [id number]    Microbiology: Results for orders placed or performed during the hospital encounter of 09/10/23  Resp panel by RT-PCR (RSV, Flu A&B, Covid) Anterior Nasal Swab     Status: None   Collection Time: 09/10/23 10:42 AM   Specimen: Anterior Nasal Swab  Result Value Ref Range Status   SARS Coronavirus 2  by RT PCR NEGATIVE NEGATIVE Final    Comment: (NOTE) SARS-CoV-2 target nucleic acids are NOT DETECTED.  The SARS-CoV-2 RNA is generally detectable in upper respiratory specimens during the acute phase of infection. The lowest concentration of SARS-CoV-2 viral copies this assay can detect is 138 copies/mL. A negative result does not preclude SARS-Cov-2 infection and should not be used as the sole basis for treatment or other patient management decisions. A negative result may occur with  improper specimen collection/handling, submission of specimen other than nasopharyngeal swab, presence of viral mutation(s) within the areas targeted by this assay, and inadequate number of viral copies(<138 copies/mL). A negative result must be combined with clinical observations, patient history, and epidemiological information. The expected result is Negative.  Fact Sheet for Patients:  bloggercourse.com  Fact Sheet for Healthcare Providers:  seriousbroker.it  This test is no t yet approved or cleared by the [place]  FDA and  has been authorized for detection and/or diagnosis of SARS-CoV-2 by FDA under an Emergency Use Authorization (EUA). This EUA will remain  in effect (meaning this test can be used) for the duration of the COVID-19 declaration under Section 564(b)(1) of the Act, 21 U.S.C.section 360bbb-3(b)(1), unless the  authorization is terminated  or revoked sooner.       Influenza A by PCR NEGATIVE NEGATIVE Final   Influenza B by PCR NEGATIVE NEGATIVE Final    Comment: (NOTE) The Xpert Xpress SARS-CoV-2/FLU/RSV plus assay is intended as an aid in the diagnosis of influenza from Nasopharyngeal swab specimens and should not be used as a sole basis for treatment. Nasal washings and aspirates are unacceptable for Xpert Xpress SARS-CoV-2/FLU/RSV testing.  Fact Sheet for Patients: bloggercourse.com  Fact Sheet for Healthcare Providers: seriousbroker.it  This test is not yet approved or cleared by the [place]  FDA and has been authorized for detection and/or diagnosis of SARS-CoV-2 by FDA under an Emergency Use Authorization (EUA). This EUA will remain in effect (meaning this test can be used) for the duration of the COVID-19 declaration under Section 564(b)(1) of the Act, 21 U.S.C. section 360bbb-3(b)(1), unless the authorization is terminated or revoked.     Resp Syncytial Virus by PCR NEGATIVE NEGATIVE Final    Comment: (NOTE) Fact Sheet for Patients: bloggercourse.com  Fact Sheet for Healthcare Providers: seriousbroker.it  This test is not yet approved or cleared by the [place]  FDA and has been authorized for detection and/or diagnosis of SARS-CoV-2 by FDA under an Emergency Use Authorization (EUA). This EUA will remain in effect (meaning this test can be used) for the duration of the COVID-19 declaration under Section 564(b)(1) of the Act, 21 U.S.C. section 360bbb-3(b)(1), unless the authorization is terminated or revoked.  Performed at [facility], [address] [place], [place] [address]    *Note: Due to a large number of results and/or encounters for the requested time period, some results have not been displayed. A complete set of results can be found in  Results Review.    Labs: CBC: Recent Labs  Lab 06/15/24 1649 06/16/24 0357 06/17/24 0445 06/19/24 0438  WBC 13.2* 10.8* 8.4 6.9  NEUTROABS 10.0*  --   --   --   HGB 12.6 11.9* 12.6 12.1  HCT 41.6 38.7 41.4 40.1  MCV 83.0 82.0 81.7 82.0  PLT 290 257 300 251   Basic Metabolic Panel: Recent Labs  Lab 06/16/24 0357 06/17/24 0445 06/18/24 0425 06/19/24 0438 06/20/24 0427  NA 144 145 141 140 140  K 3.5 3.0*  3.4* 3.4* 3.3*  CL 98 95* 94* 95* 94*  CO2 34* 38* 39* 35* 36*  GLUCOSE 136* 105* 107* 101* 118*  BUN 12 14 14 18 22   CREATININE 0.79 0.96 0.87 0.92 0.87  CALCIUM  8.9 9.0 8.9 9.1 9.1  MG  --  1.8 2.0  --   --    Liver Function Tests: Recent Labs  Lab 06/15/24 1649 06/17/24 0445  AST 30 18  ALT 16 16  ALKPHOS 115 101  BILITOT 0.3 0.4  PROT 7.7 7.0  ALBUMIN 4.9 4.2   CBG: Recent Labs  Lab 06/19/24 0752 06/19/24 1157 06/19/24 1645 06/20/24 0829 06/20/24 1303  GLUCAP 115* 123* 102* 114* 140*    Discharge time spent:  38 minutes.  Signed: Drue ONEIDA Potter, MD [facility] 06/20/2024 "

## 2024-06-20 NOTE — Progress Notes (Signed)
 Heart Failure Stewardship Pharmacy Note  PCP: Pcp, No PCP-Cardiologist: None  HPI: Stacy Dalton is a 73 y.o. 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 On admission, proBNP was 1638, HS-troponin was 33, UA with rare bacteria + no WBC, and lactic acid was 1.3. Chest x-ray noted mild interstitial edema, mild bilateral lobe opacities.   Pertinent cardiac history: TTE 08/2021 noted LVEF of 60-65% with G1DD. TTE 06/17/24 with LVEF of 60-65% mild LVH, mild to moderate MR.  Pertinent Lab Values: Creat  Date Value Ref Range Status  11/06/2012 0.79 0.50 - 1.10 mg/dL Final   Creatinine, Ser  Date Value Ref Range Status  06/20/2024 0.87 0.44 - 1.00 mg/dL Final   BUN  Date Value Ref Range Status  06/20/2024 22 8 - 23 mg/dL Final  87/69/7977 20 8 - 27 mg/dL Final   Potassium  Date Value Ref Range Status  06/20/2024 3.3 (L) 3.5 - 5.1 mmol/L Final   Sodium  Date Value Ref Range Status  06/20/2024 140 135 - 145 mmol/L Final  07/01/2021 142 134 - 144 mmol/L Final   B Natriuretic Peptide  Date Value Ref Range Status  12/17/2021 63.0 0.0 - 100.0 pg/mL Final    Comment:    Performed at [facility], [address] [place], [place] [address]   Magnesium   Date Value Ref Range Status  06/18/2024 2.0 1.7 - 2.4 mg/dL Final    Comment:    Performed at [facility], [address] [place], [place] [address]   Hgb A1c MFr Bld  Date Value Ref Range Status  06/18/2024 5.8 (H) 4.8 - 5.6 % Final    Comment:    (NOTE) Diagnosis of Diabetes The following HbA1c ranges recommended by the American Diabetes Association (ADA) may be used as an aid in the diagnosis of diabetes mellitus.  Hemoglobin             Suggested A1C NGSP%              Diagnosis  <5.7                   Non  Diabetic  5.7-6.4                Pre-Diabetic  >6.4                   Diabetic  <7.0                   Glycemic control for                       adults with diabetes.     TSH  Date Value Ref Range Status  05/11/2023 6.964 (H) 0.350 - 4.500 uIU/mL Final    Comment:    Performed by a 3rd Generation assay with a functional sensitivity of <=0.01 uIU/mL. Performed at [facility], [address] [place], [place] [address]   06/21/2020 4.910 (H) 0.450 - 4.500 uIU/mL Final   LDH  Date Value Ref Range Status  09/19/2018 120 98 - 192 U/L Final    Comment:    Performed at [facility], [address], [place], [place] [address]    Vital Signs: Temp:  [97.8 F (36.6 C)-99.1 F (37.3 C)] 97.9 F (36.6 C) (12/19 0332)  Pulse Rate:  [58-69] 58 (12/19 0332) Cardiac Rhythm: Sinus bradycardia (12/19 0814) Resp:  [10-20] 18 (12/19 0332) BP: (96-121)/(52-64) 117/64 (12/19 0332) SpO2:  [90 %-97 %] 90 % (12/19 0332)  Intake/Output Summary (Last 24 hours) at 06/20/2024 0902 Last data filed at 06/19/2024 1333 Gross per 24 hour  Intake 120 ml  Output 500 ml  Net -380 ml    Current Heart Failure Medications:  Loop diuretic: furosemide  40 mg po daily Beta-Blocker: metoprolol  tartrate 12.5 mg daily ACEI/ARB/ARNI: Entresto  24-26 mg BID MRA: spironolactone  25 mg daily SGLT2i: none Other: none  Prior to admission Heart Failure Medications:  Loop diuretic: none Beta-Blocker: metoprolol  tartrate 12.5 mg BID ACEI/ARB/ARNI: losartan  25 mg daily MRA: none SGLT2i: none Other: amlodipine  10 mg daily  Assessment: 1. Acute on chronic diastolic heart failure (LVEF 60-65%)  , due to presumed NICM. NYHA class III symptoms.  -Symptoms: Reports still having shortness of breath. Mild LEE.  -Volume: Likely euvolemic, furosemide  transitioned to 40 mg PO daily. -Hemodynamics: BP is high. HR 60s. -SGLT2i: Questionable UTI on admission, though UA not overly convincing. Would not start at this  time. -MRA: Hypokalemic today. Continue recently added spironolactone  25 mg daily and supp K. -ARNI: Continue Entresto  24-26 mg BID   -No indication for BB noted and may complicate COPD. Can consider downtitration and discontinuation.  Plan: 1) Medication changes recommended at this time: -none  2) Patient assistance: -Entresto  copay is $16  3) Education: - Patient has been educated on current HF medications and potential additions to HF medication regimen - Patient verbalizes understanding that over the next few months, these medication doses may change and more medications may be added to optimize HF regimen - Patient has been educated on basic disease state pathophysiology and goals of therapy  Please do not hesitate to reach out with questions or concerns,  Prarthana Parlin, PharmD, CPP, BCPS, [facility] Heart Failure Pharmacist  Phone - [phone] 06/20/2024 9:02 AM

## 2024-06-20 NOTE — Progress Notes (Signed)
 Discharge instructions. RX's and follow up appts explained and provided to patient verbalized understanding. Patient taken to d/c lounge awaiting taxi.   Evadne Ose, Cena Helling, RN

## 2024-06-20 NOTE — Plan of Care (Signed)
  Problem: Activity: Goal: Ability to tolerate increased activity will improve Outcome: Adequate for Discharge   Problem: Clinical Measurements: Goal: Ability to maintain a body temperature in the normal range will improve Outcome: Adequate for Discharge   Problem: Respiratory: Goal: Ability to maintain adequate ventilation will improve Outcome: Adequate for Discharge Goal: Ability to maintain a clear airway will improve Outcome: Adequate for Discharge   Problem: Education: Goal: Knowledge of General Education information will improve Description: Including pain rating scale, medication(s)/side effects and non-pharmacologic comfort measures Outcome: Adequate for Discharge   Problem: Health Behavior/Discharge Planning: Goal: Ability to manage health-related needs will improve Outcome: Adequate for Discharge   Problem: Clinical Measurements: Goal: Ability to maintain clinical measurements within normal limits will improve Outcome: Adequate for Discharge Goal: Will remain free from infection Outcome: Adequate for Discharge Goal: Diagnostic test results will improve Outcome: Adequate for Discharge Goal: Respiratory complications will improve Outcome: Adequate for Discharge Goal: Cardiovascular complication will be avoided Outcome: Adequate for Discharge   Problem: Activity: Goal: Risk for activity intolerance will decrease Outcome: Adequate for Discharge   Problem: Nutrition: Goal: Adequate nutrition will be maintained Outcome: Adequate for Discharge   Problem: Coping: Goal: Level of anxiety will decrease Outcome: Adequate for Discharge   Problem: Elimination: Goal: Will not experience complications related to bowel motility Outcome: Adequate for Discharge Goal: Will not experience complications related to urinary retention Outcome: Adequate for Discharge   Problem: Pain Managment: Goal: General experience of comfort will improve and/or be controlled Outcome:  Adequate for Discharge   Problem: Safety: Goal: Ability to remain free from injury will improve Outcome: Adequate for Discharge   Problem: Skin Integrity: Goal: Risk for impaired skin integrity will decrease Outcome: Adequate for Discharge   Problem: Education: Goal: Ability to describe self-care measures that may prevent or decrease complications (Diabetes Survival Skills Education) will improve Outcome: Adequate for Discharge Goal: Individualized Educational Video(s) Outcome: Adequate for Discharge   Problem: Coping: Goal: Ability to adjust to condition or change in health will improve Outcome: Adequate for Discharge   Problem: Fluid Volume: Goal: Ability to maintain a balanced intake and output will improve Outcome: Adequate for Discharge   Problem: Health Behavior/Discharge Planning: Goal: Ability to identify and utilize available resources and services will improve Outcome: Adequate for Discharge Goal: Ability to manage health-related needs will improve Outcome: Adequate for Discharge   Problem: Metabolic: Goal: Ability to maintain appropriate glucose levels will improve Outcome: Adequate for Discharge   Problem: Nutritional: Goal: Maintenance of adequate nutrition will improve Outcome: Adequate for Discharge Goal: Progress toward achieving an optimal weight will improve Outcome: Adequate for Discharge   Problem: Skin Integrity: Goal: Risk for impaired skin integrity will decrease Outcome: Adequate for Discharge   Problem: Tissue Perfusion: Goal: Adequacy of tissue perfusion will improve Outcome: Adequate for Discharge

## 2024-06-20 NOTE — Plan of Care (Signed)
  Problem: Activity: °Goal: Ability to tolerate increased activity will improve °Outcome: Progressing °  °Problem: Clinical Measurements: °Goal: Ability to maintain a body temperature in the normal range will improve °Outcome: Progressing °  °Problem: Respiratory: °Goal: Ability to maintain adequate ventilation will improve °Outcome: Progressing °  °Problem: Education: °Goal: Knowledge of General Education information will improve °Description: Including pain rating scale, medication(s)/side effects and non-pharmacologic comfort measures °Outcome: Progressing °  °

## 2024-07-15 ENCOUNTER — Emergency Department
Admission: EM | Admit: 2024-07-15 | Discharge: 2024-07-15 | Disposition: A | Discharge status: Home / Self Care | Attending: Emergency Medicine | Admitting: Emergency Medicine

## 2024-07-15 ENCOUNTER — Emergency Department

## 2024-07-15 ENCOUNTER — Other Ambulatory Visit: Payer: Self-pay

## 2024-07-15 DIAGNOSIS — R059 Cough, unspecified: Secondary | ICD-10-CM | POA: Insufficient documentation

## 2024-07-15 DIAGNOSIS — R8290 Unspecified abnormal findings in urine: Secondary | ICD-10-CM | POA: Diagnosis not present

## 2024-07-15 DIAGNOSIS — R531 Weakness: Secondary | ICD-10-CM

## 2024-07-15 DIAGNOSIS — J441 Chronic obstructive pulmonary disease with (acute) exacerbation: Secondary | ICD-10-CM | POA: Diagnosis not present

## 2024-07-15 DIAGNOSIS — R0602 Shortness of breath: Secondary | ICD-10-CM

## 2024-07-15 DIAGNOSIS — I509 Heart failure, unspecified: Secondary | ICD-10-CM | POA: Insufficient documentation

## 2024-07-15 LAB — URINALYSIS, ROUTINE W REFLEX MICROSCOPIC
Bacteria, UA: NONE SEEN
Bilirubin Urine: NEGATIVE
Glucose, UA: NEGATIVE mg/dL
Hgb urine dipstick: NEGATIVE
Ketones, ur: NEGATIVE mg/dL
Nitrite: NEGATIVE
Protein, ur: NEGATIVE mg/dL
Specific Gravity, Urine: 1.019 (ref 1.005–1.030)
WBC, UA: >50 WBC/hpf (ref 0–5)
pH: 5 (ref 5.0–8.0)

## 2024-07-15 LAB — COMPREHENSIVE METABOLIC PANEL WITH GFR
ALT: 11 U/L (ref 0–44)
AST: 24 U/L (ref 15–41)
Albumin: 4.4 g/dL (ref 3.5–5.0)
Alkaline Phosphatase: 112 U/L (ref 38–126)
Anion gap: 13 (ref 5–15)
BUN: 16 mg/dL (ref 8–23)
CO2: 30 mmol/L (ref 22–32)
Calcium: 10.2 mg/dL (ref 8.9–10.3)
Chloride: 96 mmol/L — ABNORMAL LOW (ref 98–111)
Creatinine, Ser: 1.05 mg/dL — ABNORMAL HIGH (ref 0.44–1.00)
GFR, Estimated: 55 mL/min — ABNORMAL LOW
Glucose, Bld: 117 mg/dL — ABNORMAL HIGH (ref 70–99)
Potassium: 3.5 mmol/L (ref 3.5–5.1)
Sodium: 139 mmol/L (ref 135–145)
Total Bilirubin: 0.6 mg/dL (ref 0.0–1.2)
Total Protein: 7.3 g/dL (ref 6.5–8.1)

## 2024-07-15 LAB — MAGNESIUM: Magnesium: 1.8 mg/dL (ref 1.7–2.4)

## 2024-07-15 LAB — CBC WITH DIFFERENTIAL/PLATELET
Abs Immature Granulocytes: 0.02 K/uL (ref 0.00–0.07)
Basophils Absolute: 0 K/uL (ref 0.0–0.1)
Basophils Relative: 0 %
Eosinophils Absolute: 0.4 K/uL (ref 0.0–0.5)
Eosinophils Relative: 5 %
HCT: 44.5 % (ref 36.0–46.0)
Hemoglobin: 14 g/dL (ref 12.0–15.0)
Immature Granulocytes: 0 %
Lymphocytes Relative: 22 %
Lymphs Abs: 1.6 K/uL (ref 0.7–4.0)
MCH: 25.5 pg — ABNORMAL LOW (ref 26.0–34.0)
MCHC: 31.5 g/dL (ref 30.0–36.0)
MCV: 81.1 fL (ref 80.0–100.0)
Monocytes Absolute: 0.8 K/uL (ref 0.1–1.0)
Monocytes Relative: 12 %
Neutro Abs: 4.2 K/uL (ref 1.7–7.7)
Neutrophils Relative %: 61 %
Platelets: 198 K/uL (ref 150–400)
RBC: 5.49 MIL/uL — ABNORMAL HIGH (ref 3.87–5.11)
RDW: 14.7 % (ref 11.5–15.5)
WBC: 7 K/uL (ref 4.0–10.5)
nRBC: 0 % (ref 0.0–0.2)

## 2024-07-15 LAB — RESP PANEL BY RT-PCR (RSV, FLU A&B, COVID)  RVPGX2
Influenza A by PCR: NEGATIVE
Influenza B by PCR: NEGATIVE
Resp Syncytial Virus by PCR: NEGATIVE
SARS Coronavirus 2 by RT PCR: NEGATIVE

## 2024-07-15 LAB — TROPONIN T, HIGH SENSITIVITY
Troponin T High Sensitivity: 16 ng/L (ref 0–19)
Troponin T High Sensitivity: <15 ng/L (ref 0–19)

## 2024-07-15 LAB — PRO BRAIN NATRIURETIC PEPTIDE: Pro Brain Natriuretic Peptide: 150 pg/mL

## 2024-07-15 MED ORDER — IPRATROPIUM-ALBUTEROL 0.5-2.5 (3) MG/3ML IN SOLN
3.0000 mL | Freq: Once | RESPIRATORY_TRACT | Status: AC
  Administered 2024-07-15: 3 mL via RESPIRATORY_TRACT
  Filled 2024-07-15: qty 3

## 2024-07-15 NOTE — ED Notes (Signed)
 [name] called for transport, he plans to set up an uber and call me back with arrangement details.

## 2024-07-15 NOTE — ED Notes (Addendum)
 Pt ambulated to restroom without assistance, gait steady. Urine sample collected, labeled, and sent to lab

## 2024-07-15 NOTE — ED Provider Notes (Signed)
 "  [facility] Provider Note    Event Date/Time   First MD Initiated Contact with Patient 07/15/24 0153     (approximate)   History   Weakness and Shortness of Breath   HPI  Stacy Dalton is a 74 y.o. female who presents to the ED for evaluation of Weakness and Shortness of Breath   I reviewed medical DC summary from 1 month ago where she was admitted for CHF exacerbation, pneumonia  Patient presents to the ED for evaluation of 2-3 days of feeling like junk.  Reports generalized weakness, cough congestion and shortness of breath.  Episode of chest pain earlier today while walking that resolved with rest, no pain now.   Physical Exam   Triage Vital Signs: ED Triage Vitals [07/15/24 0151]  Encounter Vitals Group     BP (!) 185/89     Girls Systolic BP Percentile      Girls Diastolic BP Percentile      Boys Systolic BP Percentile      Boys Diastolic BP Percentile      Pulse Rate 67     Resp 18     Temp 98.7 F (37.1 C)     Temp Source Oral     SpO2 93 %     Weight      Height      Head Circumference      Peak Flow      Pain Score      Pain Loc      Pain Education      Exclude from Growth Chart     Most recent vital signs: Vitals:   07/15/24 0151 07/15/24 0434  BP: (!) 185/89 (!) 164/83  Pulse: 67 67  Resp: 18 18  Temp: 98.7 F (37.1 C) 98.1 F (36.7 C)  SpO2: 93% 93%    General: Awake, no distress.  Seems mildly fatigued but generally well-appearing, conversational CV:  Good peripheral perfusion.  Resp:  Normal effort.  No tachypnea or wheezing Abd:  No distention.  Soft MSK:  No deformity noted.  Neuro:  No focal deficits appreciated. Other:     ED Results / Procedures / Treatments   Labs (all labs ordered are listed, but only abnormal results are displayed) Labs Reviewed  COMPREHENSIVE METABOLIC PANEL WITH GFR - Abnormal; Notable for the following components:      Result Value   Chloride 96 (*)    Glucose, Bld  117 (*)    Creatinine, Ser 1.05 (*)    GFR, Estimated 55 (*)    All other components within normal limits  CBC WITH DIFFERENTIAL/PLATELET - Abnormal; Notable for the following components:   RBC 5.49 (*)    MCH 25.5 (*)    All other components within normal limits  URINALYSIS, ROUTINE W REFLEX MICROSCOPIC - Abnormal; Notable for the following components:   Color, Urine YELLOW (*)    APPearance HAZY (*)    Leukocytes,Ua MODERATE (*)    All other components within normal limits  RESP PANEL BY RT-PCR (RSV, FLU A&B, COVID)  [id number]  URINE CULTURE  MAGNESIUM   PRO BRAIN NATRIURETIC PEPTIDE  TROPONIN T, HIGH SENSITIVITY  TROPONIN T, HIGH SENSITIVITY    EKG Sinus rhythm with a rate of 62 bpm.  Normal axis and intervals.  No present acute ischemia.  RADIOLOGY CXR interpreted by me without evidence of acute cardiopulmonary pathology.  Official radiology report(s): DG Chest 2 View Result Date: 07/15/2024 EXAM: 2 VIEW(S) XRAY  OF THE CHEST 07/15/2024 02:17:00 AM COMPARISON: 06/15/2024 CLINICAL HISTORY: SOB, generalized weakness FINDINGS: LUNGS AND PLEURA: Persistent left basilar scarring. No pleural effusion. No pneumothorax. HEART AND MEDIASTINUM: Calcified aorta. No acute abnormality of the cardiac and mediastinal silhouettes. BONES AND SOFT TISSUES: No acute osseous abnormality. IMPRESSION: 1. Persistent left basilar scarring. Electronically signed by: Oneil Devonshire MD MD 07/15/2024 02:21 AM EST RP Workstation: [id number]    PROCEDURES and INTERVENTIONS:  .1-3 Lead EKG Interpretation  Performed by: [name] Rover, MD Authorized by: [name] Rover, MD     Interpretation: normal     ECG rate:  70   ECG rate assessment: normal     Rhythm: sinus rhythm     Ectopy: none     Conduction: normal     Medications  ipratropium-albuterol  (DUONEB) 0.5-2.5 (3) MG/3ML nebulizer solution 3 mL (3 mLs Nebulization Given 07/15/24 0334)     IMPRESSION / MDM / ASSESSMENT AND PLAN / ED COURSE  I reviewed  the triage vital signs and the nursing notes.  Differential diagnosis includes, but is not limited to, ACS, PTX, PNA, muscle strain/spasm, PE, dissection, anxiety, pleural effusion, viral syndrome  {Patient presents with symptoms of an acute illness or injury that is potentially life-threatening.  Patient presents to the ED with generalized weakness and dyspnea with a benign workup and suitable for outpatient management.  Looks well with an essentially normal exam.  Normal CBC, metabolic panel, troponins, BNP, viral swabs.  UA with moderate leukocytes but no bacteria and she has no urinary symptoms.  She is ambulatory independently with a normal gait.  Reports feeling better after a DuoNeb though her pulmonary exam had good airflow without wheezing prior to this.  Ultimately benign workup and we discussed close ED return precautions.  Clinical Course as of 07/15/24 0525  Tue Jul 15, 2024  0440 Reassessed, feeling better [DS]    Clinical Course User Index [DS] [name] Rover, MD     FINAL CLINICAL IMPRESSION(S) / ED DIAGNOSES   Final diagnoses:  Generalized weakness  COPD exacerbation (HCC)  Shortness of breath     Rx / DC Orders   ED Discharge Orders     None        Note:  This document was prepared using Dragon voice recognition software and may include unintentional dictation errors.   [name] Rover, MD 07/15/24 [id number]  "

## 2024-07-15 NOTE — ED Notes (Signed)
 [name], Md made aware of limited access to cardiac monitoring capabilities. Pt refused intermittent catheterization for urine sample collection. Purwick placed.

## 2024-07-15 NOTE — ED Notes (Signed)
 Pt encouraged to use attempt to provide urine sample. Pt still not does not want intermittent catheterization for urine collection. Pt provided w/ water.

## 2024-07-15 NOTE — ED Triage Notes (Signed)
 Pt arrives from independent living via ems c/o weakness and sob x2-3 days, chest pain earlier today, denies at this time. NADN.

## 2024-07-17 LAB — URINE CULTURE: Culture: 20000 — AB

## 2024-07-18 NOTE — Progress Notes (Signed)
 ED Antimicrobial Stewardship Positive Culture Follow Up   Stacy Dalton is an 74 y.o. female who presented to [facility] on 07/15/2024 with a chief complaint of  Chief Complaint  Patient presents with   Weakness   Shortness of Breath    Recent Results (from the past 720 hours)  Resp panel by RT-PCR (RSV, Flu A&B, Covid) Anterior Nasal Swab     Status: None   Collection Time: 07/15/24  1:58 AM   Specimen: Anterior Nasal Swab  Result Value Ref Range Status   SARS Coronavirus 2 by RT PCR NEGATIVE NEGATIVE Final    Comment: (NOTE) SARS-CoV-2 target nucleic acids are NOT DETECTED.  The SARS-CoV-2 RNA is generally detectable in upper respiratory specimens during the acute phase of infection. The lowest concentration of SARS-CoV-2 viral copies this assay can detect is 138 copies/mL. A negative result does not preclude SARS-Cov-2 infection and should not be used as the sole basis for treatment or other patient management decisions. A negative result may occur with  improper specimen collection/handling, submission of specimen other than nasopharyngeal swab, presence of viral mutation(s) within the areas targeted by this assay, and inadequate number of viral copies(<138 copies/mL). A negative result must be combined with clinical observations, patient history, and epidemiological information. The expected result is Negative.  Fact Sheet for Patients:  bloggercourse.com  Fact Sheet for Healthcare Providers:  seriousbroker.it  This test is no t yet approved or cleared by the [place]  FDA and  has been authorized for detection and/or diagnosis of SARS-CoV-2 by FDA under an Emergency Use Authorization (EUA). This EUA will remain  in effect (meaning this test can be used) for the duration of the COVID-19 declaration under Section 564(b)(1) of the Act, 21 U.S.C.section 360bbb-3(b)(1), unless the authorization is terminated  or  revoked sooner.       Influenza A by PCR NEGATIVE NEGATIVE Final   Influenza B by PCR NEGATIVE NEGATIVE Final    Comment: (NOTE) The Xpert Xpress SARS-CoV-2/FLU/RSV plus assay is intended as an aid in the diagnosis of influenza from Nasopharyngeal swab specimens and should not be used as a sole basis for treatment. Nasal washings and aspirates are unacceptable for Xpert Xpress SARS-CoV-2/FLU/RSV testing.  Fact Sheet for Patients: bloggercourse.com  Fact Sheet for Healthcare Providers: seriousbroker.it  This test is not yet approved or cleared by the [place]  FDA and has been authorized for detection and/or diagnosis of SARS-CoV-2 by FDA under an Emergency Use Authorization (EUA). This EUA will remain in effect (meaning this test can be used) for the duration of the COVID-19 declaration under Section 564(b)(1) of the Act, 21 U.S.C. section 360bbb-3(b)(1), unless the authorization is terminated or revoked.     Resp Syncytial Virus by PCR NEGATIVE NEGATIVE Final    Comment: (NOTE) Fact Sheet for Patients: bloggercourse.com  Fact Sheet for Healthcare Providers: seriousbroker.it  This test is not yet approved or cleared by the [place]  FDA and has been authorized for detection and/or diagnosis of SARS-CoV-2 by FDA under an Emergency Use Authorization (EUA). This EUA will remain in effect (meaning this test can be used) for the duration of the COVID-19 declaration under Section 564(b)(1) of the Act, 21 U.S.C. section 360bbb-3(b)(1), unless the authorization is terminated or revoked.  Performed at [facility], [address], [place], [place] [address]   Urine Culture     Status: Abnormal   Collection Time: 07/15/24  1:58 AM   Specimen: Urine, Clean Catch  Result Value Ref  Range Status   Specimen Description   Final    URINE, CLEAN CATCH Performed at  [facility], [address] [place], [place] [address]    Special Requests   Final    NONE Performed at [facility], [address] [place], [place] [address]    Culture 20,000 COLONIES/mL ENTEROCOCCUS FAECALIS (A)  Final   Report Status 07/17/2024 FINAL  Final   Organism ID, Bacteria ENTEROCOCCUS FAECALIS (A)  Final      Susceptibility   Enterococcus faecalis - MIC*    AMPICILLIN <=2 SENSITIVE Sensitive     NITROFURANTOIN  <=16 SENSITIVE Sensitive     VANCOMYCIN  1 SENSITIVE Sensitive     * 20,000 COLONIES/mL ENTEROCOCCUS FAECALIS   [x]  Patient discharged originally without antimicrobial agent and treatment is now indicated  Stacy Dalton is a 74yoF who discharged from the emergency department on 1/13. Urine cultures drawn from the ED came back positive for ampicillin-sensitive enterococcus faecalis 20k. Urinalysis was not clean (6-10 squamous epithelial cells). Patient was not discharged on antibiotics as no urinary symptoms were present in ED or prior.   Attempted to notify patient about culture results and if any urinary symptoms since discharge but unable to reach or leave voicemail. Called son, Stacy Dalton (listed in profile), who was familiar with Stacy Dalton's ED visit and had seen Stacy Dalton since discharge. Relayed culture results and inquired if Char had any urinary symptoms. Son mentioned Stacy Dalton had increased mental status and urinary symptoms consistent to prior UTIs - mentioned Stacy Dalton has had a long history of recurrent UTIs. Stacy Dalton noted that Stacy Dalton had not been complaining of fevers or flank pain, just dysuria.   Discussed ED culture results and increased urinary symptoms with Dr. Ernest. Agreed to plan for amoxicillin  500 mg PO TID x 5 days. Called prescription in to [facility] on [address] in [place], [place].  New antibiotic prescription: amoxicillin  500 mg TID x 5 days  ED Provider: Dr Stacy Dalton Stacy Dalton, PharmD Pharmacy Resident  07/18/2024 12:38 PM
